# Patient Record
Sex: Male | Born: 1950 | Race: Black or African American | Hispanic: No | Smoking: Current every day smoker
Health system: Southern US, Community
[De-identification: ages and names within clinical notes are randomized; demographics above are authoritative.]

## PROBLEM LIST (undated history)

## (undated) DIAGNOSIS — F1411 Cocaine abuse, in remission: Secondary | ICD-10-CM

## (undated) DIAGNOSIS — I639 Cerebral infarction, unspecified: Secondary | ICD-10-CM

## (undated) DIAGNOSIS — I4891 Unspecified atrial fibrillation: Secondary | ICD-10-CM

## (undated) DIAGNOSIS — E119 Type 2 diabetes mellitus without complications: Secondary | ICD-10-CM

## (undated) DIAGNOSIS — I10 Essential (primary) hypertension: Secondary | ICD-10-CM

## (undated) DIAGNOSIS — M1990 Unspecified osteoarthritis, unspecified site: Secondary | ICD-10-CM

## (undated) DIAGNOSIS — C801 Malignant (primary) neoplasm, unspecified: Secondary | ICD-10-CM

## (undated) DIAGNOSIS — F1410 Cocaine abuse, uncomplicated: Secondary | ICD-10-CM

## (undated) DIAGNOSIS — I2699 Other pulmonary embolism without acute cor pulmonale: Secondary | ICD-10-CM

## (undated) DIAGNOSIS — Z87891 Personal history of nicotine dependence: Secondary | ICD-10-CM

## (undated) DIAGNOSIS — I8393 Asymptomatic varicose veins of bilateral lower extremities: Secondary | ICD-10-CM

## (undated) DIAGNOSIS — Z86718 Personal history of other venous thrombosis and embolism: Secondary | ICD-10-CM

## (undated) DIAGNOSIS — Z972 Presence of dental prosthetic device (complete) (partial): Secondary | ICD-10-CM

## (undated) DIAGNOSIS — I509 Heart failure, unspecified: Secondary | ICD-10-CM

## (undated) HISTORY — DX: Personal history of nicotine dependence: Z87.891

## (undated) HISTORY — DX: Heart failure, unspecified: I50.9

## (undated) HISTORY — DX: Essential (primary) hypertension: I10

## (undated) HISTORY — DX: Personal history of other venous thrombosis and embolism: Z86.718

## (undated) HISTORY — PX: ABDOMINAL SURGERY: SHX537

## (undated) HISTORY — DX: Cocaine abuse, uncomplicated: F14.10

## (undated) HISTORY — PX: COLONOSCOPY: SHX174

## (undated) HISTORY — PX: VASCULAR SURGERY: SHX849

## (undated) HISTORY — PX: THORACOTOMY: SUR1349

## (undated) MED FILL — Dexamethasone Sodium Phosphate Inj 100 MG/10ML: INTRAMUSCULAR | Qty: 1 | Status: AC

---

## 2004-04-03 ENCOUNTER — Emergency Department: Payer: Self-pay | Admitting: Emergency Medicine

## 2004-05-17 DIAGNOSIS — Z86718 Personal history of other venous thrombosis and embolism: Secondary | ICD-10-CM

## 2004-05-17 HISTORY — DX: Personal history of other venous thrombosis and embolism: Z86.718

## 2004-06-07 ENCOUNTER — Inpatient Hospital Stay: Payer: Self-pay | Admitting: Anesthesiology

## 2005-03-04 ENCOUNTER — Inpatient Hospital Stay: Payer: Self-pay

## 2005-03-10 ENCOUNTER — Ambulatory Visit: Payer: Self-pay

## 2006-06-29 ENCOUNTER — Emergency Department: Payer: Self-pay | Admitting: Emergency Medicine

## 2006-06-29 ENCOUNTER — Other Ambulatory Visit: Payer: Self-pay

## 2007-09-10 ENCOUNTER — Other Ambulatory Visit: Payer: Self-pay

## 2007-09-10 ENCOUNTER — Emergency Department: Payer: Self-pay | Admitting: Emergency Medicine

## 2007-12-13 ENCOUNTER — Emergency Department: Payer: Self-pay | Admitting: Emergency Medicine

## 2007-12-13 ENCOUNTER — Other Ambulatory Visit: Payer: Self-pay

## 2008-02-18 ENCOUNTER — Emergency Department: Payer: Self-pay | Admitting: Emergency Medicine

## 2008-04-11 ENCOUNTER — Emergency Department: Payer: Self-pay

## 2008-05-17 DIAGNOSIS — I2699 Other pulmonary embolism without acute cor pulmonale: Secondary | ICD-10-CM

## 2008-05-17 HISTORY — DX: Other pulmonary embolism without acute cor pulmonale: I26.99

## 2009-06-15 ENCOUNTER — Emergency Department: Payer: Self-pay | Admitting: Unknown Physician Specialty

## 2011-01-19 ENCOUNTER — Emergency Department: Payer: Self-pay | Admitting: Emergency Medicine

## 2011-04-11 ENCOUNTER — Emergency Department: Payer: Self-pay | Admitting: Emergency Medicine

## 2011-04-21 ENCOUNTER — Inpatient Hospital Stay: Payer: Self-pay | Admitting: Specialist

## 2011-04-26 ENCOUNTER — Encounter: Payer: Self-pay | Admitting: *Deleted

## 2011-04-26 ENCOUNTER — Other Ambulatory Visit: Payer: Self-pay | Admitting: Physician Assistant

## 2011-04-26 ENCOUNTER — Other Ambulatory Visit: Payer: Self-pay | Admitting: Physical Medicine and Rehabilitation

## 2011-04-26 DIAGNOSIS — I639 Cerebral infarction, unspecified: Secondary | ICD-10-CM

## 2011-04-26 DIAGNOSIS — I82409 Acute embolism and thrombosis of unspecified deep veins of unspecified lower extremity: Secondary | ICD-10-CM

## 2011-04-26 DIAGNOSIS — I10 Essential (primary) hypertension: Secondary | ICD-10-CM | POA: Insufficient documentation

## 2011-04-26 DIAGNOSIS — E119 Type 2 diabetes mellitus without complications: Secondary | ICD-10-CM

## 2011-04-26 DIAGNOSIS — E1142 Type 2 diabetes mellitus with diabetic polyneuropathy: Secondary | ICD-10-CM

## 2011-04-26 DIAGNOSIS — D649 Anemia, unspecified: Secondary | ICD-10-CM

## 2011-04-26 NOTE — PMR Pre-admission (Signed)
[facility] PMR Admission Coordinator Pre-Admission Assessment  Patient Name: [name]  Date of Birth: [birth year]  Insurance Information: Self pay Medicaid Application Date:04/26/11 Case manager at [facility] called to initiate application process-? Where in process it is         Current Medical History:   Patient Admitting Diagnosis: L-CVA  History of Present Illness: 60yo male admitted to [facility] on 04/21/11 after being found down at home. Brought to ED by EMS with r- facial droop and r-hemiparesis. He was also unable to speak. Pt does have a history of DVT 2006, diabetes (even though he shakes his head no when this is mentioned), malignant HTN, history of alcohol, tobacco abuse. It was documented that patient was supposed to be taking Coumadin, but don't think patient was compliant with this. Stroke work up done.INR on admission was 1.1, Hgb A1C=6.7.Echo done which shows an EF=>55%, normal Left ventricular function and no thrombus formation. Carotid US shows no stenosis. 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 Patient remains with expressive aphasia and apraxia with R-hemiparesis. Working with therapies and tolerating.   NIH Stroke scale: don't know results from outside hospital   Height and Weight Height: 6\' 3"  (190.5 cm) Weight: 72.576 kg (160 lb) Type of Weight: Stated BSA (Calculated - sq m): 1.96 sq meters BMI (Calculated): 20  Weight in (lb) to have BMI = 25: 199.6   Prior Rehab/Hospitalizations:no prior rehab stays  Medications:  PTA Medications:  Coumadin was listed as PTA meds at home No current outpatient prescriptions on file as of 04/26/2011.   No current facility-administered medications on file as of 04/26/2011.   Current Medications: Current outpatient prescriptions:warfarin (COUMADIN) 5 MG tablet, Take 5 mg by mouth daily.  , Disp: , Rfl:   Past Medical History: HTN, gout, history of DVT 2006, Diabetes (even though patient denies this),  history of alcohol, tobacco use, positive cocaine UDS on admission to hospital  Family History: mother deceased- aneurysm, father-decease diabetes  Precautions/Special Needs:  Precautions/Special Needs Precautions/Special Needs: Other Other Precautions: falls; patient having current gout flare in R knee Conditions/Impairments that will impact rehabilitation: Balance Balance Conditions/Impairments: R- hemiparesis Sensory Changes Conditions/Impairments: R-hemiparesis Other Conditions/Impairments: expressive aphasia  Additional Precautions:   Cognition:   appears to understand   Home Living:  Home Living Lives With: Alone Receives Help From: Friend(s);Family Type of Home: Apartment Home Layout: One level Home Access: Stairs to enter Entrance Stairs-Rails: None Entrance Stairs-Number of Steps: 2 Bathroom Shower/Tub: Associate Professor: Yes Home Adaptive Equipment: None  Functional Transfers:    Coordination:   R- hemiparesis Home Assistive Devices/Equipment:  Home Assistive Devices/Equipment Home Assistive Devices/Equipment: None;Other (Comment) (none that patient was able to report)  Discharge Planning:  Discharge Planning Living Arrangements: Family members Support Systems: Family members;Friends/neighbors Assistance Needed: will need assistance upon discharge Do you have any problems obtaining your medications?:  (patient unable to answer this due to expressive aphasia) Type of Residence: Private residence Patient expects to be discharged to:: sister's home Expected Discharge Date:  (to be determined) Case Management Consult Needed: Yes (Comment)  Prior Functional Levels:  Prior Functional Level Bed Mobility: Independent Transfers: Independent Mobility - Walk/Wheelchair: Independent Upper Body Dressing: Independent Lower Body Dressing: Independent Grooming: Independent Eating/Drinking: Independent Toilet Transfer:  Independent Bladder Continence: Independent Bowel Management: Independent Stair Climbing: Independent Communication: Independent Memory: Independent Cooking/Meal Prep: Independent Housework: Independent Money Management: Independent Driving: Independent  Current Functional Levels:  Current Functional Level Bed Mobility: CGA  Transfers: Min A Mobility - Walk/Wheelchair: Min A RW 77ft Upper Body Dressing: Min A Lower Body Dressing: Min-Mod A Grooming: R-hemiparesis (pt is right hand dominant) Eating/Drinking: set up Toilet Transfer: Min A Bladder Continence: urinal Stair Climbing: not attempted Communication: expressive aphasia Memory: WNL  Previous Home Environment:  Previous Home Environment Living Arrangements: Family members Support Systems: Family members;Friends/neighbors Assistance Needed: will need assistance upon discharge Do you have any problems obtaining your medications?:  (patient unable to answer this due to expressive aphasia) Type of Residence: Private residence Patient expects to be discharged to:: sister's home Expected Discharge Date:  (to be determined) Home Environment Number of Levels: 1  Discharge Living Setting:  Discharge Living Setting Plans for Discharge Living Setting: Other (Comment);House;Lives with (comment) (pt to go to sister's house in [place] after d/c) Discharge Living Setting Number of Levels: 2 Discharge Living Setting Number of Steps: 9 Discharge Living Setting is Bedroom on Main Floor?: Yes Discharge Living Setting is Bathroom on Main Floor?: Yes  Social/Family/Support Systems:  Social/Family/Support Systems Patient Roles: Partner;Parent;Other (Comment) (brother) Anticipated Caregiver: Renaldo Fiddler [phone] (girlfriend Darryl Nestle [phone]) Ability/Limitations of Caregiver: sister is able to assist Caregiver Availability: 24/7 Discharge Plan Discussed with Primary Caregiver: Yes Is Caregiver In Agreement with Plan?:  Yes Does Caregiver/Family have Issues with Lodging/Transportation while Pt is in Rehab?: No  Goals/Additional Needs:  Goals/Additional Needs Patient/Family Goal for Rehab: to be as independent as possible Cultural Considerations: Christian Dietary Needs: dysphagia I, honey thick Equipment Needs: to be determine Pt/Family Agrees to Admission and willing to participate: Yes Program Orientation Provided & Reviewed with Pt/Caregiver Including Roles  & Responsibilities: Yes  Preadmission Screen Completed By:  Oletta Darter, 04/26/2011 12:08 PM  Patient's condition:  Patient has been deemed appropriate for CIR  Preadmission Screen Completed by Toni Amend, Time/Date, 12:27pm on 04/26/11.    Discussed status with Dr. Riley Kill on 04/26/11 at 0800 (time/date) and received telephone approval for admission today.  Admission Coordinator:  Oletta Darter, time12:28pm/Date12/10/12  .

## 2011-04-26 NOTE — H&P (Signed)
Physical Medicine and Rehabilitation Admission H&P  Chief complaint: Right sided weakness and right facial droop : HPI: [name] is an 60 y.o. male with history of DM, DVT in the past, polysubstance abuse, admitted 12/05 to [facility] with right sided weakness and right facial droop. Patient on chronic coumadin?      INR 1.1 at admission. 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   Carotid dopplers without ICA stenosis.  2D echo with EF 55% no wall abnormality.  UDS positive for cocaine.  Patient started on aspirin for CVA prophylaxis.  Noted to have dysphagia with oro-motor weakness with drooling, and expressive deficits.  D1 diet with honey liquids recommended by ST.  Was noted to have new onset atrial fibrillation past admission.    Review of Systems  HENT: Negative for hearing loss.   Eyes: Negative for blurred vision and double vision.  Respiratory: Positive for cough and shortness of breath.   Cardiovascular: Positive for leg swelling. Negative for chest pain and palpitations.  Gastrointestinal: Negative for nausea, vomiting and abdominal pain.  Genitourinary: Negative for urgency and frequency.  Musculoskeletal: Positive for joint pain.       Right knee pain.  Skin: Positive for rash.  Neurological: Positive for sensory change, speech change, focal weakness and weakness. Negative for headaches.   Past Medical History  Diagnosis Date  . History of deep vein thrombosis 2006  . Hypertension   . Diabetes mellitus diet controlled.   . Gout current and history of  . History of tobacco abuse     has smoked for 50 years  . Cocaine abuse     + UDS on admission   No past surgical history on file.  Family History  Problem Relation Age of Onset  . Aneurysm Mother   . Diabetes Father    Social History:  Divorced.  Lives alone.  Has a  girlfriend.   He reports that he has been smoking Cigarettes about one  PPD.  He has a 50 pack-year smoking history. He does  not have any smokeless tobacco history on file. He reports that he uses illicit drugs. Drinks 4 drinks daily ( mixed drinks and beer).  Plans to discharge to sister's home in [place].     Allergies: Allergies no known allergies  Prior to Admission medications   Medication Sig Start Date End Date Taking? Authorizing Provider           Home:  One level home with 2 steps at entry.  Sister's home is 2 level with 7 steps at entry.   Functional History:  Independent and driving PTA.    Functional Status:  Mobility:  CGA for sit to stand while pushing heavily on LUE/LLE.  CGA for ambulating 20 to 40 feet with RW and assist to keep RUE on walker.  Limited by gout flare  and right knee instability.      ADL:  Working on fine motor movements and stereognosis RUE. Sensory deficits reported..  Cognition:  Oral apraxia.    LABS: Hgb A1C @ 6.7  Hbg 11.4   WBC 5.1     Plts 201 Chol: 95     Na: 139     K+3.4         BUN: 11       Cr: 0.70  HDL: 43 LDL: 38 Trig: 70  There were no vitals taken for this visit. Physical Exam  Constitutional: He appears well-developed and well-nourished.  HENT:  Head: Normocephalic and atraumatic.  Mouth/Throat: Oropharyngeal exudate (Thrush on tongue) present.    Eyes: Pupils are equal, round, and reactive to light. Scleral icterus is present.  Neck: Normal range of motion. Neck supple.  Cardiovascular: Normal rate.  An irregularly irregular rhythm present.  Pulmonary/Chest: Effort normal and breath sounds normal.  Abdominal: Soft.  Musculoskeletal: He exhibits edema.       Right knee: He exhibits decreased range of motion and effusion.       Legs:      Edema BLE right > left.  Neurological: He is alert. A cranial nerve deficit (Right facial weakness.  Dysphagia) and sensory deficit (Decreased sensation RUE) is present. Coordination abnormal.       RUE weakness with decreased ROM at shoulder and motor apraxia.  Decreased fine motor movements. He has a  right mild pronator drift.  Expressive aphasia.  Follows basic commands without difficulty.  Nods appropriately to Y/N questions.  Occasionally noted to have delayed processing.  Speech is quite dysarthric and often. He is a right central 7 and tongue deviation. masks appropriate responses to questions.  Skin: Skin is warm. Abrasion (right knee) noted.       Multiple scrapes and scabs over the right knee in the peripatellar region. There are a few scrapes on the left knee as well.    No results found for this or any previous visit (from the past 48 hour(s)). No results found.  Post Admission Physician Evaluation: 1. Functional deficits secondary  to left temporal parietal infarcts. 2. Patient admitted to receive collaborative, interdisciplinary care between the physiatrist, rehab nursing staff, and therapy team. 3. Patient's level of medical complexity and substantial therapy needs in context of that medical necessity cannot be provided at a lesser intensity of care. 4. Patient has experienced substantial functional loss from his/her baseline. Please see above functional assessments for current functional levels are premorbid functional levels. Judging by the patient's diagnosis, physical exam, and functional history, the patient has potential for functional progress which will result in measurable gains while on inpatient rehab.  These gains will be of substantial and practical use upon discharge in facilitating mobility and self-care at the household level. 5. Physiatrist will provide 24 hour management of medical needs as well as oversight of the therapy plan/treatment and provide guidance as appropriate regarding the interaction of the two. 6. 24 hour rehab nursing will assist in the management of  bladder management, bowel management, safety, disease management and patient education  and help integrate therapy concepts, techniques,education, etc. 7. PT will assess and treat for: balance,  locomotion, strength and transferring. Goals are: independent with assistive device. 8. OT will assess and treat for: bathing, dressing, locomotion, strength, toileting and transferring .  Goals are: independent with assistive device.  9. SLP will assess and treat for: cognition and speech.  Goals are: independent with increased time to min verbal cues 10. Case Management and Social Worker will assess and treat for psychological issues and discharge planning. 11. Team conference will be held weekly to assess progress toward goals and to determine barriers to discharge. 12.  Patient will receive at least 3 hours of therapy per day at least 5 days per week. 13. ELOS and Prognosis: One week excellent   Medical Problem List and Plan: 1. DVT Prophylaxis/Anticoagulation: Pharmaceutical: Lovenox  2. Pain Management: complete steroid taper.  Add colchine and discontinue indocin as contraindicated with acute CVA. Right knee pain is in [place] factor for gait.  3. Mood: reported to be labile.  Currently without distress. Monitor.   4.  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  5. Diabetes mellitus:  Monitor with AC/HS cbg checks.  CM diet for now. Add oral agent if BS not controlled.  6. HTN (hypertension):  Monitor with bid checks.  On nitro patch, metoprolol, and coreg for control?  7. Chronic Anemia? :  Check anemia panel  10.  H/O  DVT (deep venous thrombosis):  Add lovenox.TEDs for edema control.  11. Gout:  On steroid taper.  Add colchicine  12. Thrush:  Add diflucan.   04/26/2011, 3:54 PM

## 2011-04-27 ENCOUNTER — Inpatient Hospital Stay (HOSPITAL_COMMUNITY)
Admission: RE | Admit: 2011-04-27 | Discharge: 2011-05-05 | DRG: 945 | Disposition: A | Discharge status: Home / Self Care | Payer: Medicaid Other | Source: Ambulatory Visit | Attending: Physical Medicine & Rehabilitation | Admitting: Physical Medicine & Rehabilitation

## 2011-04-27 ENCOUNTER — Other Ambulatory Visit: Payer: Self-pay | Admitting: Physical Medicine and Rehabilitation

## 2011-04-27 DIAGNOSIS — I10 Essential (primary) hypertension: Secondary | ICD-10-CM

## 2011-04-27 DIAGNOSIS — R29810 Facial weakness: Secondary | ICD-10-CM

## 2011-04-27 DIAGNOSIS — Z5189 Encounter for other specified aftercare: Secondary | ICD-10-CM

## 2011-04-27 DIAGNOSIS — I4891 Unspecified atrial fibrillation: Secondary | ICD-10-CM

## 2011-04-27 DIAGNOSIS — I6350 Cerebral infarction due to unspecified occlusion or stenosis of unspecified cerebral artery: Secondary | ICD-10-CM

## 2011-04-27 DIAGNOSIS — Z86718 Personal history of other venous thrombosis and embolism: Secondary | ICD-10-CM

## 2011-04-27 DIAGNOSIS — M25569 Pain in unspecified knee: Secondary | ICD-10-CM

## 2011-04-27 DIAGNOSIS — F1410 Cocaine abuse, uncomplicated: Secondary | ICD-10-CM

## 2011-04-27 DIAGNOSIS — G8190 Hemiplegia, unspecified affecting unspecified side: Secondary | ICD-10-CM

## 2011-04-27 DIAGNOSIS — I639 Cerebral infarction, unspecified: Secondary | ICD-10-CM

## 2011-04-27 DIAGNOSIS — R1310 Dysphagia, unspecified: Secondary | ICD-10-CM

## 2011-04-27 DIAGNOSIS — I6330 Cerebral infarction due to thrombosis of unspecified cerebral artery: Secondary | ICD-10-CM

## 2011-04-27 DIAGNOSIS — R488 Other symbolic dysfunctions: Secondary | ICD-10-CM

## 2011-04-27 DIAGNOSIS — R4701 Aphasia: Secondary | ICD-10-CM

## 2011-04-27 DIAGNOSIS — Z87891 Personal history of nicotine dependence: Secondary | ICD-10-CM

## 2011-04-27 DIAGNOSIS — I82409 Acute embolism and thrombosis of unspecified deep veins of unspecified lower extremity: Secondary | ICD-10-CM

## 2011-04-27 DIAGNOSIS — E1142 Type 2 diabetes mellitus with diabetic polyneuropathy: Secondary | ICD-10-CM

## 2011-04-27 DIAGNOSIS — B370 Candidal stomatitis: Secondary | ICD-10-CM

## 2011-04-27 DIAGNOSIS — E119 Type 2 diabetes mellitus without complications: Secondary | ICD-10-CM

## 2011-04-27 DIAGNOSIS — M109 Gout, unspecified: Secondary | ICD-10-CM

## 2011-04-27 DIAGNOSIS — D649 Anemia, unspecified: Secondary | ICD-10-CM

## 2011-04-27 LAB — GLUCOSE, CAPILLARY
Glucose-Capillary: 103 mg/dL — ABNORMAL HIGH (ref 70–99)
Glucose-Capillary: 106 mg/dL — ABNORMAL HIGH (ref 70–99)

## 2011-04-27 MED ORDER — FLUCONAZOLE 100 MG PO TABS
100.0000 mg | ORAL_TABLET | Freq: Every day | ORAL | Status: AC
  Administered 2011-04-27 – 2011-05-01 (×5): 100 mg via ORAL
  Filled 2011-04-27 (×5): qty 1

## 2011-04-27 MED ORDER — TRAZODONE HCL 50 MG PO TABS
25.0000 mg | ORAL_TABLET | Freq: Every evening | ORAL | Status: DC | PRN
Start: 2011-04-27 — End: 2011-05-05

## 2011-04-27 MED ORDER — NICOTINE 21 MG/24HR TD PT24
21.0000 mg | MEDICATED_PATCH | Freq: Every day | TRANSDERMAL | Status: DC
  Administered 2011-04-27 – 2011-05-05 (×9): 21 mg via TRANSDERMAL
  Filled 2011-04-27 (×11): qty 1

## 2011-04-27 MED ORDER — PREDNISONE 20 MG PO TABS
20.0000 mg | ORAL_TABLET | Freq: Every day | ORAL | Status: AC
  Administered 2011-04-28: 20 mg via ORAL
  Filled 2011-04-27: qty 1

## 2011-04-27 MED ORDER — GUAIFENESIN-DM 100-10 MG/5ML PO SYRP
5.0000 mL | ORAL_SOLUTION | Freq: Four times a day (QID) | ORAL | Status: DC | PRN
Start: 2011-04-27 — End: 2011-05-05
  Administered 2011-05-02 – 2011-05-03 (×2): 10 mL via ORAL
  Filled 2011-04-27 (×2): qty 10

## 2011-04-27 MED ORDER — PREDNISONE 5 MG PO TABS
5.0000 mg | ORAL_TABLET | Freq: Every day | ORAL | Status: AC
  Administered 2011-05-01 – 2011-05-02 (×2): 5 mg via ORAL
  Filled 2011-04-27 (×2): qty 1

## 2011-04-27 MED ORDER — SIMVASTATIN 10 MG PO TABS
10.0000 mg | ORAL_TABLET | Freq: Every day | ORAL | Status: DC
  Administered 2011-04-27 – 2011-05-04 (×8): 10 mg via ORAL
  Filled 2011-04-27 (×10): qty 1

## 2011-04-27 MED ORDER — PROMETHAZINE HCL 25 MG/ML IJ SOLN: 12.5000 mg | Freq: Four times a day (QID) | INTRAMUSCULAR | Status: DC | PRN

## 2011-04-27 MED ORDER — ACETAMINOPHEN 325 MG PO TABS: 325.0000 mg | ORAL_TABLET | ORAL | Status: DC | PRN

## 2011-04-27 MED ORDER — ALUM & MAG HYDROXIDE-SIMETH 400-400-40 MG/5ML PO SUSP: 30.0000 mL | ORAL | Status: DC | PRN

## 2011-04-27 MED ORDER — INSULIN ASPART 100 UNIT/ML ~~LOC~~ SOLN
0.0000 [IU] | Freq: Every day | SUBCUTANEOUS | Status: DC
  Filled 2011-04-27: qty 3

## 2011-04-27 MED ORDER — NYSTATIN 100000 UNIT/ML MT SUSP
10.0000 mL | Freq: Four times a day (QID) | OROMUCOSAL | Status: AC
  Administered 2011-04-27 – 2011-05-02 (×20): 1000000 [IU] via ORAL
  Filled 2011-04-27 (×20): qty 10

## 2011-04-27 MED ORDER — ALUM & MAG HYDROXIDE-SIMETH 200-200-20 MG/5ML PO SUSP: 30.0000 mL | ORAL | Status: DC | PRN

## 2011-04-27 MED ORDER — DIPHENHYDRAMINE HCL 12.5 MG/5ML PO ELIX: 12.5000 mg | ORAL_SOLUTION | Freq: Four times a day (QID) | ORAL | Status: DC | PRN

## 2011-04-27 MED ORDER — BISACODYL 10 MG RE SUPP: 10.0000 mg | Freq: Every day | RECTAL | Status: DC | PRN

## 2011-04-27 MED ORDER — ENOXAPARIN SODIUM 40 MG/0.4ML ~~LOC~~ SOLN
40.0000 mg | SUBCUTANEOUS | Status: DC
  Administered 2011-04-28 – 2011-05-04 (×7): 40 mg via SUBCUTANEOUS
  Filled 2011-04-27 (×9): qty 0.4

## 2011-04-27 MED ORDER — FOOD THICKENER (THICKENUP CLEAR)
2.0000 | ORAL | Status: DC | PRN
  Filled 2011-04-27: qty 2.8

## 2011-04-27 MED ORDER — NITROGLYCERIN 0.3 MG/HR TD PT24
0.3000 mg | MEDICATED_PATCH | Freq: Every day | TRANSDERMAL | Status: DC
  Filled 2011-04-27: qty 1

## 2011-04-27 MED ORDER — INSULIN ASPART 100 UNIT/ML ~~LOC~~ SOLN
0.0000 [IU] | Freq: Three times a day (TID) | SUBCUTANEOUS | Status: DC
  Administered 2011-04-28 – 2011-05-02 (×3): 1 [IU] via SUBCUTANEOUS

## 2011-04-27 MED ORDER — COLCHICINE 0.6 MG PO TABS
0.6000 mg | ORAL_TABLET | Freq: Every day | ORAL | Status: DC
  Administered 2011-04-28: 0.6 mg via ORAL
  Filled 2011-04-27 (×2): qty 1

## 2011-04-27 MED ORDER — PROMETHAZINE HCL 12.5 MG RE SUPP: 12.5000 mg | Freq: Four times a day (QID) | RECTAL | Status: DC | PRN

## 2011-04-27 MED ORDER — TRAMADOL HCL 50 MG PO TABS
50.0000 mg | ORAL_TABLET | Freq: Four times a day (QID) | ORAL | Status: DC | PRN
  Administered 2011-04-27 – 2011-05-03 (×5): 50 mg via ORAL
  Filled 2011-04-27 (×6): qty 1

## 2011-04-27 MED ORDER — FLEET ENEMA 7-19 GM/118ML RE ENEM
1.0000 | ENEMA | Freq: Once | RECTAL | Status: AC | PRN
  Filled 2011-04-27: qty 1

## 2011-04-27 MED ORDER — PREDNISONE 10 MG PO TABS
10.0000 mg | ORAL_TABLET | Freq: Every day | ORAL | Status: AC
  Administered 2011-04-29 – 2011-04-30 (×2): 10 mg via ORAL
  Filled 2011-04-27 (×2): qty 1

## 2011-04-27 MED ORDER — NITROGLYCERIN 0.3 MG/HR TD PT24
0.3000 mg | MEDICATED_PATCH | Freq: Every day | TRANSDERMAL | Status: DC
  Administered 2011-04-27 – 2011-05-04 (×8): 0.3 mg via TRANSDERMAL
  Filled 2011-04-27 (×9): qty 1

## 2011-04-27 MED ORDER — PROMETHAZINE HCL 12.5 MG PO TABS: 12.5000 mg | ORAL_TABLET | Freq: Four times a day (QID) | ORAL | Status: DC | PRN

## 2011-04-27 MED ORDER — CARVEDILOL 3.125 MG PO TABS
3.1250 mg | ORAL_TABLET | Freq: Two times a day (BID) | ORAL | Status: DC
  Filled 2011-04-27 (×2): qty 1

## 2011-04-27 MED ORDER — METOPROLOL TARTRATE 25 MG PO TABS
25.0000 mg | ORAL_TABLET | Freq: Two times a day (BID) | ORAL | Status: DC
  Administered 2011-04-27 – 2011-05-05 (×16): 25 mg via ORAL
  Filled 2011-04-27 (×18): qty 1

## 2011-04-27 MED ORDER — POLYETHYLENE GLYCOL 3350 17 G PO PACK
17.0000 g | PACK | Freq: Every day | ORAL | Status: DC | PRN
  Administered 2011-04-28: 17 g via ORAL
  Filled 2011-04-27: qty 1

## 2011-04-27 MED ORDER — HYDROCERIN EX CREA
TOPICAL_CREAM | Freq: Two times a day (BID) | CUTANEOUS | Status: DC
  Administered 2011-04-27 – 2011-05-04 (×13): via TOPICAL
  Filled 2011-04-27: qty 113

## 2011-04-27 NOTE — Progress Notes (Signed)
Patient admitted from [facility] via private transport; Alert, expressive aphasia; appropriate responses with nods, gestures; follows two step commands with extra time; occasional one word responses; Oriented to room and rehab process; safety plan reviewed with pt and sisters; VSS; see FIM for functional status, FS for full assessment.[name]

## 2011-04-28 DIAGNOSIS — Z5189 Encounter for other specified aftercare: Secondary | ICD-10-CM

## 2011-04-28 DIAGNOSIS — I6330 Cerebral infarction due to thrombosis of unspecified cerebral artery: Secondary | ICD-10-CM

## 2011-04-28 LAB — DIFFERENTIAL
Basophils Relative: 0 % (ref 0–1)
Eosinophils Absolute: 0.1 10*3/uL (ref 0.0–0.7)
Eosinophils Relative: 1 % (ref 0–5)
Lymphs Abs: 1.5 10*3/uL (ref 0.7–4.0)

## 2011-04-28 LAB — GLUCOSE, CAPILLARY
Glucose-Capillary: 125 mg/dL — ABNORMAL HIGH (ref 70–99)
Glucose-Capillary: 115 mg/dL — ABNORMAL HIGH (ref 70–99)

## 2011-04-28 LAB — COMPREHENSIVE METABOLIC PANEL
ALT: 11 U/L (ref 0–53)
AST: 17 U/L (ref 0–37)
Albumin: 2.7 g/dL — ABNORMAL LOW (ref 3.5–5.2)
Alkaline Phosphatase: 46 U/L (ref 39–117)
CO2: 27 mEq/L (ref 19–32)
Chloride: 102 mEq/L (ref 96–112)
Creatinine, Ser: 0.83 mg/dL (ref 0.50–1.35)
GFR calc non Af Amer: >90 mL/min (ref >90)
Potassium: 3.7 mEq/L (ref 3.5–5.1)
Sodium: 140 mEq/L (ref 135–145)
Total Bilirubin: 0.3 mg/dL (ref 0.3–1.2)

## 2011-04-28 LAB — CBC
MCH: 26.2 pg (ref 26.0–34.0)
MCHC: 32.7 g/dL (ref 30.0–36.0)
MCV: 80.3 fL (ref 78.0–100.0)
Platelets: 192 10*3/uL (ref 150–400)
RBC: 3.7 MIL/uL — ABNORMAL LOW (ref 4.22–5.81)

## 2011-04-28 LAB — URINALYSIS, ROUTINE W REFLEX MICROSCOPIC
Ketones, ur: NEGATIVE mg/dL
Leukocytes, UA: NEGATIVE
Nitrite: NEGATIVE
Protein, ur: 100 mg/dL — AB
Urobilinogen, UA: 1 mg/dL (ref 0.0–1.0)

## 2011-04-28 MED ORDER — ASPIRIN EC 325 MG PO TBEC
325.0000 mg | DELAYED_RELEASE_TABLET | Freq: Every day | ORAL | Status: DC
  Administered 2011-04-28 – 2011-05-05 (×8): 325 mg via ORAL
  Filled 2011-04-28 (×9): qty 1

## 2011-04-28 MED ORDER — COLCHICINE 0.6 MG PO TABS
0.6000 mg | ORAL_TABLET | Freq: Two times a day (BID) | ORAL | Status: DC
  Administered 2011-04-28 – 2011-05-04 (×13): 0.6 mg via ORAL
  Filled 2011-04-28 (×16): qty 1

## 2011-04-28 MED ORDER — ENSURE PUDDING PO PUDG
1.0000 | Freq: Two times a day (BID) | ORAL | Status: DC
  Administered 2011-04-28 – 2011-05-05 (×11): 1 via ORAL

## 2011-04-28 NOTE — Plan of Care (Signed)
Overall Plan of Care [facility]) Patient Details Name: [name] MRN: [id number] DOB: 04-22-1951  Diagnosis:  cva  Primary Diagnosis:    CVA (cerebral infarction) Co-morbidities: diabetes, htn  Functional Problem List  Patient demonstrates impairments in the following areas: Balance, Bladder, Bowel, Cognition, Endurance, Linguistic, Medication Management, Motor, Nutrition, Pain, Perception, Safety and Skin Integrity  Basic ADL's: grooming, bathing, dressing and toileting Advanced ADL's: simple meal preparation  Transfers:  bed mobility, bed to chair, toilet, tub/shower, car, furniture and floor Locomotion:  ambulation and stairs  Additional Impairments:  Communication  expression  Anticipated Outcomes Item Anticipated Outcome  Eating/Swallowing  Mod I with least restrictive diet  Basic self-care  Mod I  Tolieting  Mod I  Bowel/Bladder  supervision  Transfers  Mod I  Locomotion  Mod I  Communication  Min A  Cognition  Mod I  Pain  2/10   managed  Safety/Judgment  supervision  Other     Therapy Plan: PT Frequency: 1-2 X/day, 60-90 minutes OT Frequency: 1-2 X/day, 60-90 minutes SLP Frequency: 1-2X/day, 60-90 minutes  Team Interventions: Item RN PT OT SLP SW TR Other  Self Care/Advanced ADL Retraining   x      Neuromuscular Re-Education  x x   x   Therapeutic Activities  x x x  x   UE/LE Strength Training/ROM  x x   x   UE/LE Coordination Activities  x x   x   Visual/Perceptual Remediation/Compensation         DME/Adaptive Equipment Instruction  x x   x   Therapeutic Exercise  x x   x   Balance/Vestibular Training  x x   x   Patient/Family Education x x x x  x   Cognitive Remediation/Compensation  x x x  x   Functional Mobility Training  x    x   Ambulation/Gait Training  x    x   Stair Training  x       Wheelchair Propulsion/Positioning         Health and safety inspector Reintegration  x x   x   Dysphagia/Aspiration  Precaution Training    x     Speech/Language Facilitation   x x     Bladder Management x        Bowel Management x        Disease Management/Prevention x        Pain Management x x       Medication Management x        Skin Care/Wound Management         Splinting/Orthotics         Discharge Planning   x  x x   Psychosocial Support     x x                      Team Discharge Planning: Destination:  Home Projected Follow-up:  PT, OT and Outpatient Projected Equipment Needs:  Cane and Environmental consultant Patient/family involved in discharge planning:  Yes  MD ELOS: 7-10 days Medical Rehab Prognosis:  Excellent Assessment: Patient is admitted for CIR therapies.  PT will focus on mobility, NMR, adaptive equipment, with  Modified independent goals.  OT will work with the patient on upper extremity strength, self-care, NMR, adaptive equipment training with modified ind to supervision goals.  SLP will focus on pt's dysarthria, aphasia, apraxia, and dysphagia with goals modified independent to minimal  assistance.

## 2011-04-28 NOTE — Progress Notes (Signed)
INITIAL ADULT NUTRITION ASSESSMENT Date: 04/28/2011   Time: 11:40 AM  Reason for Assessment: Dysphagia  ASSESSMENT: Male 60 y.o.  Dx: CVA (cerebral infarction)  Hx:  Past Medical History  Diagnosis Date  . History of deep vein thrombosis 2006  . Hypertension   . Diabetes mellitus   . Gout current and history of  . History of tobacco abuse     has smoked for 50 years  . Cocaine abuse     + UDS on admission   Related Meds:     . aspirin EC  325 mg Oral Daily  . colchicine  0.6 mg Oral BID  . enoxaparin  40 mg Subcutaneous Q24H  . fluconazole  100 mg Oral Daily  . hydrocerin   Topical BID  . insulin aspart  0-5 Units Subcutaneous QHS  . insulin aspart  0-9 Units Subcutaneous TID WC  . metoprolol tartrate  25 mg Oral BID  . nicotine  21 mg Transdermal Daily  . nitroGLYCERIN  0.3 mg Transdermal q1800  . nystatin  10 mL Oral QID  . predniSONE  20 mg Oral Q breakfast   Followed by  . predniSONE  10 mg Oral Q breakfast   Followed by  . predniSONE  5 mg Oral Q breakfast  . simvastatin  10 mg Oral q1800  . DISCONTD: carvedilol  3.125 mg Oral BID WC  . DISCONTD: colchicine  0.6 mg Oral Daily  . DISCONTD: nitroGLYCERIN  0.3 mg Transdermal Daily   Ht: 6\' 2"  (188 cm)  Wt: 216 lb 8 oz (98.204 kg)  Ideal Wt: 86.4 kg % Ideal Wt: 114%  Usual Wt: n/a % Usual Wt: n/a  Body mass index is 27.80 kg/(m^2). Pt is overweight.  Food/Nutrition Related Hx: unable to obtain hx from pt  Labs:  CMP     Component Value Date/Time   NA 140 04/28/2011 0625   K 3.7 04/28/2011 0625   CL 102 04/28/2011 0625   CO2 27 04/28/2011 0625   GLUCOSE 100* 04/28/2011 0625   BUN 14 04/28/2011 0625   CREATININE 0.83 04/28/2011 0625   CALCIUM 8.9 04/28/2011 0625   PROT 6.8 04/28/2011 0625   ALBUMIN 2.7* 04/28/2011 0625   AST 17 04/28/2011 0625   ALT 11 04/28/2011 0625   ALKPHOS 46 04/28/2011 0625   BILITOT 0.3 04/28/2011 0625   GFRNONAA >90 04/28/2011 0625   GFRAA >90 04/28/2011 0625    CBG (last 3)   Basename 04/28/11 0710 04/27/11 2032 04/27/11 1613  GLUCAP 100* 106* 103*   Diet Order: Dysphagia 3, Nectar Thickened Liquids  Supplements/Tube Feeding: none  IVF:    Estimated Nutritional Needs:   Kcal:  2000 - 2200 Protein:  105 - 115 g Fluid:  2 - 2.2 L/d  Pt eating well per chart review. Denies wt loss PTA.  NUTRITION DIAGNOSIS: -Swallowing difficulty (NI-1.1).  Status: Ongoing  RELATED TO: recent CVA  AS EVIDENCE BY: need for thickened liquids.  MONITORING/EVALUATION(Goals): Goal: Pt to consume >75% of meals and supplements. Monitor: PO intake, weights, labs  EDUCATION NEEDS: -No education needs identified at this time  INTERVENTION: 1. Ensure Pudding PO BID for additional protein and kcal 2. RD to follow nutrition care plan  Dietitian #: [id number]  DOCUMENTATION CODES Per approved criteria  -Not Applicable    Adair Laundry 04/28/2011, 11:40 AM

## 2011-04-28 NOTE — Progress Notes (Signed)
Occupational Therapy Assessment and Plan  Patient Details  Name: [name] MRN: [id number] Date of Birth: May 01, 1951  OT Diagnosis: acute pain and hemiplegia affecting dominant side Rehab Potential: Rehab Potential: Good ELOS: 10days   Today's Date: 04/28/2011 Time: 745-845 ( )    Assessment & Plan Clinical Impression: Patient is a 60 y.o. year old male with  with history of DM, DVT in the past, polysubstance abuse, admitted 12/05 to [facility] with right sided weakness and right facial droop. Patient on chronic coumadin? INR 1.1 at admission. 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 Carotid dopplers without ICA stenosis. 2D echo with EF 55% no wall abnormality. UDS positive for cocaine. Patient started on aspirin for CVA prophylaxis. Noted to have dysphagia with oro-motor weakness with drooling, and expressive deficits.    Patient transferred to [facility] on 04/27/2011 .      Patient currently requires mod with basic self-care skills secondary to muscle weakness, impaired timing and sequencing, motor apraxia, decreased coordination and decreased motor planning, decreased motor planning, decreased safety awareness and delayed processing and decreased standing balance, hemiplegia and decreased balance strategies. Pt also presents with dysphagia, expressive aphasia and oral apraxia.  Prior to hospitalization, patient could complete ADLs with independence.  Patient will benefit from skilled intervention to increase independence with basic self-care skills prior to discharge home with care partner.  Anticipate patient will require intermittent supervision and follow up outpatient.  OT - End of Session Activity Tolerance: Tolerates 30+ min activity without fatigue OT Assessment Rehab Potential: Good OT Plan OT Frequency: 1-2 X/day, 60-90 minutes Estimated Length of Stay: 10days OT Treatment/Interventions: Ambulation/gait training;Community reintegration;Functional  mobility training;Self Care/advanced ADL retraining;Therapeutic Exercise;UE/LE Strength taining/ROM;UE/LE Coordination activities;Therapeutic Activities;Patient/family education;Balance/vestibular training;DME/adaptive equipment instruction;Neuromuscular re-education OT Recommendation Follow Up Recommendations: Outpatient OT  Precautions/Restrictions  Precautions Precautions: Fall Required Braces or Orthoses: No Restrictions Weight Bearing Restrictions: No General Chart Reviewed: Yes Family/Caregiver Present: No Vital Signs   Pain Pain Assessment Pain Assessment: 0-10 Pain Score:   2 Pain Type: Chronic pain Pain Location: Knee Pain Orientation: Right Pain Descriptors: Aching Pain Onset: On-going Pain Intervention(s): RN made aware Home Living/Prior Functioning Home Living Lives With: Alone Receives Help From: Family;Friend(s) Bathroom Shower/Tub: Tub/shower unit IADL History Current License: Yes Occupation: Retired ADL ADL Eating: Maximal cueing;Set up Where Assessed-Eating: Chair Upper Body Bathing: Setup;Minimal cueing Where Assessed-Upper Body Bathing: Sitting at sink Lower Body Bathing: Moderate assistance Where Assessed-Lower Body Bathing: Standing at sink;Sitting at sink Upper Body Dressing: Setup Where Assessed-Upper Body Dressing: Sitting at sink Lower Body Dressing: Maximal assistance;Minimal cueing Where Assessed-Lower Body Dressing: Standing at sink;Sitting at sink Toilet Transfer Method: Not assessed Tub/Shower Transfer: Not assessed Vision/Perception  Vision - History Baseline Vision: No visual deficits Patient Visual Report: No change from baseline Vision - Assessment Eye Alignment: Within Functional Limits Perception Perception: Within Functional Limits Praxis Praxis: Impaired Praxis Impairment Details: Motor planning;Perseveration  Cognition Arousal/Alertness: Awake/alert Orientation Level: Oriented X4 Attention: Selective Selective  Attention: Appears intact Memory: Appears intact Safety/Judgment: Appears intact Sensation Sensation Light Touch: Impaired by gross assessment Stereognosis: Impaired Detail Stereognosis Impaired Details: Impaired RUE Proprioception: Impaired by gross assessment;Impaired Detail Proprioception Impaired Details: Impaired RUE;Impaired RLE Coordination Gross Motor Movements are Fluid and Coordinated: No Fine Motor Movements are Fluid and Coordinated: No Coordination and Movement Description: decreased FMC with grooming tools/ utensils Finger Nose Finger Test: decreased speed and coordination Motor  Motor Motor: Hemiplegia;Motor apraxia Motor - Skilled Clinical Observations: rt mild pronator drift Mobility  Transfers Transfers:  Yes Sit to Stand: 3: Mod assist Stand to Sit: 4: Min assist  Trunk/Postural Assessment  Cervical Assessment Cervical Assessment: Within Functional Limits Thoracic Assessment Thoracic Assessment: Within Functional Limits Lumbar Assessment Lumbar Assessment: Within Functional Limits Postural Control Postural Control: Within Functional Limits  Balance Balance Balance Assessed: Yes Static Standing Balance Static Standing - Balance Support: No upper extremity supported;During functional activity Static Standing - Level of Assistance: 4: Min assist Extremity/Trunk Assessment RUE Assessment RUE Assessment: Exceptions to [facility] (rt mild pronator drift) RUE Strength RUE Overall Strength: Deficits (4-/5) LUE Assessment LUE Assessment: Within Functional Limits  Recommendations for other services: None  Discharge Criteria: Patient will be discharged from OT if patient refuses treatment 3 consecutive times without medical reason, if treatment goals not met, if there is a change in medical status, if patient makes no progress towards goals or if patient is discharged from hospital.  The above assessment, treatment plan, treatment alternatives and goals were  discussed and mutually agreed upon: by patient   Treatment session:  1:1 OT eval initiated, OT purpose, role, and goals discussed.  Self care retraining at sink level: focus on sit to stand, standing balance, use of right hand at non dominant level, decreased FMC with functional tasks, self feeding with min cuing for swallowing precautions, functional communication with contextual cues and gestures. Melonie Florida 04/28/2011, 11:55 AM

## 2011-04-28 NOTE — Progress Notes (Signed)
Patient information reviewed and entered into [facility] system by Ganesh Deeg, RN, CRRN, PPS Coordinator.  Information including medical coding and functional independence measure will be reviewed and updated through discharge.    

## 2011-04-28 NOTE — Progress Notes (Deleted)
Physical Therapy Assessment and Plan  Patient Details  Name: [name] MRN: [id number] Date of Birth: Mar 05, 1951  PT Diagnosis: Abnormality of gait, Impaired sensation and Muscle weakness Rehab Potential: Good ELOS:     Today's Date: 04/28/2011 Time: 4098-1191 Time Calculation (min): 60 min  Assessment & Plan Clinical Impression: Patient is a 60 y.o. year old male with history of DM, DVT in the past, polysubstance abuse, and gout admitted to CIR on 04/26/2011 for s/p CVA with right hemiparesis, expressive aphasia with oral apraxia and dysphagia.  PTA, patient lived alone in an apartment with 2 steps to enter.  Also, PTA, patient was independent with ADL's, IADL's, all mobility and was driving.  Patient was unemployed PTA.  Patient currently presents with   Patient will benefit from skilled inpatient PT to address the above impairments and provide family education for safe discharge to sister's house with outpatient PT.   PT - End of Session Activity Tolerance: Tolerates 30+ min activity with multiple rests PT Assessment Rehab Potential: Good Barriers to Discharge: None PT Plan PT Frequency: 1-2 X/day, 60-90 minutes PT Treatment/Interventions: Ambulation/gait training;Balance/vestibular training;Community reintegration;Functional mobility training;Neuromuscular re-education;Patient/family education;Stair training;Therapeutic Activities;Therapeutic Exercise;UE/LE Strength taining/ROM PT Recommendation Recommendations for Other Services: Speech consult Follow Up Recommendations: Outpatient PT  Precautions/Restrictions Precautions Precautions: Fall Required Braces or Orthoses: No Restrictions Weight Bearing Restrictions: No Pain Pain Assessment Pain Assessment: 0-10 Pain Score:   2 Pain Type: Chronic pain Pain Location: Knee Pain Orientation: Right Pain Descriptors: Aching; Pain Onset: On-going Pain Intervention(s): Repositioned;Ambulation/increased  activity Multiple Pain Sites: No Home Living/Prior Functioning Home Living Lives With: Alone;Other (Comment) (will discharge to sister's house) Receives Help From: Family (sister) Type of Home: House Home Layout: Two level (sister's house) Home Access: Stairs to enter Home Adaptive Equipment: None Prior Function Level of Independence: Independent with basic ADLs;Independent with gait;Independent with homemaking with ambulation Able to Take Stairs?: Yes Driving: Yes Vision/Perception     Cognition Overall Cognitive Status: Appears within functional limits for tasks assessed Arousal/Alertness: Awake/alert Orientation Level: Other (Comment) (expressive aphasia; yes/no responses) Sensation Sensation Light Touch: Impaired by gross assessment Stereognosis: Impaired Detail Stereognosis Impaired Details: Impaired RUE Proprioception: Impaired by gross assessment;Impaired Detail Proprioception Impaired Details: Impaired RUE;Impaired RLE Coordination Gross Motor Movements are Fluid and Coordinated: No Fine Motor Movements are Fluid and Coordinated: No Coordination and Movement Description: decreased FMC with grooming tools/ utensils Finger Nose Finger Test: decreased speed and coordination Motor  Motor Motor: Hemiplegia;Motor apraxia Motor - Skilled Clinical Observations: rt mild pronator drift  Mobility Bed Mobility Bed Mobility: No Transfers Transfers: Yes Sit to Stand: 5: Supervision;With upper extremity assist;From chair/3-in-1 Stand to Sit: 5: Supervision;With upper extremity assist;To chair/3-in-1 Stand Pivot Transfers: 3: Mod assist Stand Pivot Transfer Details: Tactile cues for weight shifting;Tactile cues for posture;Tactile cues for placement;Tactile cues for weight beaing;Visual cues for safe use of DME/AE;Verbal cues for precautions/safety Squat Pivot Transfers: 5: Supervision;With upper extremity assistance;With armrests (plinth<->chair) Locomotion   Ambulation Ambulation: Yes Ambulation/Gait Assistance: 5: Supervision Ambulation Distance (Feet): 150 Feet (>174ft) Assistive device: None Gait Gait: Yes Gait Pattern: Impaired Gait Pattern: Decreased stance time - right;Decreased step length - left;Step-through pattern;Antalgic Stairs / Additional Locomotion Stairs: Yes Stairs Assistance: 5: Supervision Stair Management Technique: Alternating pattern;Two rails Height of Stairs: 9  Corporate treasurer: Yes Wheelchair Assistance: 5: Financial planner Details: Verbal cues for technique;Verbal cues for Copy: Both upper extremities Wheelchair Parts Management: Needs assistance Distance: 25  Trunk/Postural Assessment  Cervical Assessment Cervical Assessment:  Within Functional Limits Thoracic Assessment Thoracic Assessment: Within Functional Limits Lumbar Assessment Lumbar Assessment: Within Functional Limits Postural Control Postural Control: Within Functional Limits  Balance Balance Balance Assessed: Yes Standardized Balance Assessment Standardized Balance Assessment: Berg Balance Test;Dynamic Gait Index Berg Balance Test Sit to Stand: Able to stand without using hands and stabilize independently Standing Unsupported: Able to stand safely 2 minutes Sitting with Back Unsupported but Feet Supported on Floor or Stool: Able to sit safely and securely 2 minutes Stand to Sit: Controls descent by using hands Transfers: Able to transfer safely, definite need of hands Standing Unsupported with Eyes Closed: Able to stand 10 seconds with supervision Standing Ubsupported with Feet Together: Able to place feet together independently and stand 1 minute safely From Standing, Reach Forward with Outstretched Arm: Can reach forward >12 cm safely (5") From Standing Position, Pick up Object from Floor: Able to pick up shoe safely and easily From Standing Position, Turn to Look  Behind Over each Shoulder: Looks behind one side only/other side shows less weight shift Turn 360 Degrees: Able to turn 360 degrees safely in 4 seconds or less Standing Unsupported, Alternately Place Feet on Step/Stool: Needs assistance to keep from falling or unable to try Standing Unsupported, One Foot in Front: Able to plae foot ahead of the other independently and hold 30 seconds Standing on One Leg: Tries to lift leg/unable to hold 3 seconds but remains standing independently Total Score: 43  Dynamic Gait Index Level Surface: Mild Impairment Change in Gait Speed: Mild Impairment Gait with Horizontal Head Turns: Mild Impairment Gait with Vertical Head Turns: Mild Impairment Gait and Pivot Turn: Moderate Impairment Step Over Obstacle: Mild Impairment Step Around Obstacles: Mild Impairment Steps: Moderate Impairment Total Score: 14  Static Standing Balance Static Standing - Balance Support: No upper extremity supported;During functional activity Static Standing - Level of Assistance: 4: Min assist Extremity Assessment  RUE Assessment RUE Assessment: Exceptions to [facility] (rt mild pronator drift) RUE Strength RUE Overall Strength: Deficits (4-/5) LUE Assessment LUE Assessment: Within Functional Limits      Recommendations for other services: None  Discharge Criteria: Patient will be discharged from PT if patient refuses treatment 3 consecutive times without medical reason, if treatment goals not met, if there is a change in medical status, if patient makes no progress towards goals or if patient is discharged from hospital.  The above assessment, treatment plan, treatment alternatives and goals were discussed and mutually agreed upon: by patient  Christianne Dolin 04/28/2011, 12:35 PM

## 2011-04-28 NOTE — Progress Notes (Addendum)
[facility]patient [facility] Individual Statement of Services  Patient Name:  [name]  Date:  04/28/2011  Welcome to the [facility]patient [facility].  Our goal is to provide you with an individualized program based on your diagnosis and situation, designed to meet your specific needs.  With this comprehensive rehabilitation program, you will be expected to participate in at least 3 hours of rehabilitation therapies Monday-Friday, with modified therapy programming on the weekends.  Your rehabilitation program will include the following services:  Physical Therapy (PT), Occupational Therapy (OT), Speech Therapy (ST), 24 hour per day rehabilitation nursing, Therapeutic Recreaction (TR), Case Management (RN and Child psychotherapist), Rehabilitation Medicine, Nutrition Services and Pharmacy Services  Weekly team conferences will be held on  Tuesday  to discuss your progress.  Your RN Case Designer, television/film set will talk with you frequently to get your input and to update you on team discussions.  Team conferences with you and your family in attendance may also be held.  Depending on your progress and recovery, your program may change.  Your RN Case Estate agent will coordinate services and will keep you informed of any changes.  Your RN Sports coach and SW names and contact numbers are listed  below.  The following services may also be recommended but are not provided by the Inpatient Rehabilitation [facility]:   Driving Evaluations  Home Health Rehabiltiation Services  Outpatient Rehabilitatation [facility]  Vocational Rehabilitation   Arrangements will be made to provide these services after discharge if needed.  Arrangements include referral to agencies that provide these services.  Your insurance has been verified to be:  none Your primary doctor is:  none  Pertinent information will be shared with your doctor and your insurance company.  Case Manager: Melanee Spry,  [facility] [phone]  Social Worker:  Amada Jupiter, [place] [phone]  ELOS: 10 days                                           Goal: Supervision  Information discussed with pt and his sister and copy was given to them by: Brock Ra, 04/28/2011, 1:22 PM

## 2011-04-28 NOTE — Progress Notes (Signed)
Speech Language Pathology Assessment and Plan  Patient Details  Name: [name] MRN: [id number] Date of Birth: Sep 24, 1950  SLP Diagnosis: Expressive Aphasia, apraxia, dysarthria oral-pharyngeal dysphagia  Rehab Potential: Good ELOS: 10 days  Session 1 Time: 4098-1191 Time Calculation: 60 mins  Session 2Time: 1530-1600 Time Calculation (min): 30 min  Assessment & Plan Clinical Impression: Patient is a 60 y.o. year old male with recent admission to the [facility] on 04/21/11  with right sided weakness and right facial droop. 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 Patient transferred to CIR on 04/27/2011 .  Patient's past medical history is significant for DM, DVT in the past, and polysubstance abuse. Pt prsents to CIR with moderate-severe expressive aphasia effecting all four modalities of language, verbal apraxia and dysarthria impacting functional communication. Pt also demonstrates moderate oral-pharyngeal dysphagia and is currently tolerating a Dys. 3 diet and nectar-thick liquids. Pt would benefit from skilled SLP services to maximize functional communication and swallow safety with least restrictive diet.   SLP - End of Session Activity Tolerance: Endurance does not limit participation in activity Patient left: in chair Nurse Communication: Diet recommendation;Swallow strategies reviewed;Other (comment) (signs placed HOB and outside pt's room) Assessment Rehab Potential: Good Barriers to Discharge:  (severity of aphasia/apraxia) Therapy Diagnosis: Aphasia;Dysarthria;Apraxia;Speech and Language deficits Type of Aphasia: Unable to differentiate diagnosis Type of Dysarthria: Flaccid  Precautions/Restrictions  Precautions Precautions: Fall;Other (comment) (thickened liquids) Required Braces or Orthoses: No Restrictions Weight Bearing Restrictions: No  Vital Signs Therapy Vitals Temp: 98.2 F (36.8 C) Temp src: Oral Pulse Rate: 85  Resp: 19  BP:  143/78 mmHg Patient Position, if appropriate: Sitting Oxygen Therapy SpO2: 99 % O2 Device: None (Room air) Pain Pain Assessment Pain Assessment: 0-10 Pain Score:   2 Pain Type: Chronic pain Pain Location: Knee Pain Orientation: Right Pain Descriptors: Aching;Other (Comment) (pain with weight-bearing) Pain Onset: On-going Patients Stated Pain Goal: 0 Pain Intervention(s): Ambulation/increased activity;Emotional support Multiple Pain Sites: No Prior Functioning Type of Home: House (sister's) Lives With: Alone;Other (Comment) (will discharge to sister's house) Receives Help From: Family Cognition Overall Cognitive Status: Appears within functional limits for tasks assessed Arousal/Alertness: Awake/alert Orientation Level: Other (Comment) (expressive aphasia: answered Y/N questions appropraitely) Attention: Selective Selective Attention: Appears intact Memory: Appears intact Awareness: Impaired (pt up in room without assistance) Awareness Impairment: Emergent impairment Problem Solving: Impaired Problem Solving Impairment: Functional basic Safety/Judgment: Impaired Comments: decreased safety awareness Comprehension Auditory Comprehension Yes/No Questions: Within Functional Limits Commands: Impaired One Step Basic Commands: 75-100% accurate Two Step Basic Commands: 75-100% accurate Multistep Basic Commands: 50-74% accurate Conversation: Simple Interfering Components: Motor planning;Processing speed EffectiveTechniques: Extra processing time;Repetition Visual Recognition/Discrimination Discrimination: Within Function Limits Reading Comprehension Reading Status: Impaired Word level: Within functional limits Sentence Level: Within functional limits Paragraph Level: Impaired Functional Environmental (signs, name badge): Within functional limits Interfering Components:  (language impairment) Expression Expression Primary Mode of Expression: Nonverbal - gestures Verbal  Expression Initiation: No impairment Automatic Speech: Name;Counting;Day of week Level of Generative/Spontaneous Verbalization: Word Repetition: Impaired Level of Impairment: Word level Naming: Impairment Responsive: 76-100% accurate Confrontation: Impaired Convergent: Not tested Divergent: Not tested Verbal Errors: Aware of errors Pragmatics: No impairment Interfering Components:  (motor planning) Effective Techniques: Sentence completion;Phonemic cues;Written cues;Other (Comment) (Y/N questions) Non-Verbal Means of Communication: Writing;Gestures Other Verbal Expression Comments: pt demonstrates expressive aphasia and apraxia Written Expression Dominant Hand: Right Written Expression: Exceptions to [facility] Self Formulation Ability: Word;Letter Interfering Components: Other (comment) (language impairment) Effective Techniques: Tactile/Visual cues;Verbal/Language cues  Recommendations for other services: None  Discharge Criteria: Patient will be discharged from SLP if patient refuses treatment 3 consecutive times without medical reason, if treatment goals not met, if there is a change in medical status, if patient makes no progress towards goals or if patient is discharged from hospital.  The above assessment, treatment plan, treatment alternatives and goals were discussed and mutually agreed upon: by patient  Session 1: Administered BSE and initiated cognitive linguistic evaluation  Session 2: Completed cognitive linguistic evaluation   Angelissa Supan 04/28/2011 4:59 PM   Clinical/Bedside Swallow Evaluation Patient Details  Name: [name] MRN: [id number] DOB: 01/26/1951 Today's Date: 04/28/2011  Past Medical History:  Past Medical History  Diagnosis Date  . History of deep vein thrombosis 2006  . Hypertension   . Diabetes mellitus   . Gout current and history of  . History of tobacco abuse     has smoked for 50 years  . Cocaine abuse     + UDS on  admission   Past Surgical History: No past surgical history on file.  Assessment/Recommendations/Treatment Plan Pt had MBSS at [facility] on 04/27/11 and recommended current diet with strict compensatory strategies. Pt then transferred to CIR that same day.  Pt administered BSE today. Pt overall supervision for appropriate use of compensatory strategies (complete chin tuck) but without overt s/s of aspiration with nectar thick liquids or dys. 3 textures. Pt demonstrated slower mastication of textures but no pocketing or oral residue noted. Recommend pt continue with current diet of Dys.3 textures and nectar-thick liquids. Pt will need another MBSS to assess for possible upgrade in 5-7 days.   SLP Assessment Risk for Aspiration: Moderate  Recommendations Solid Consistency: Dysphagia 3 (Mechanical soft) Liquid Consistency: Nectar Liquid Administration via: Cup Medication Administration: Whole meds with puree Supervision: Full supervision/cueing for compensatory strategies Compensations: Small sips/bites;Multiple dry swallows after each bite/sip;Check for pocketing;Clear throat intermittently Postural Changes and/or Swallow Maneuvers: Seated upright 90 degrees;Head turn right during swallow;Chin tuck Oral Care Recommendations: Oral care QID Other Recommendations: Order thickener from pharmacy;Remove water pitcher;Prohibited food (jello, ice cream, thin soups)  Prognosis Prognosis for Safe Diet Advancement: Good Barriers to Reach Goals: Severity of dysphagia;Language deficits  Individuals Consulted Consulted and Agree with Results and Recommendations: Patient  Swallow Study Goals  SLP Swallowing Goals Patient will consume recommended diet without observed clinical signs of aspiration with: Modified independent assistance Patient will utilize recommended strategies during swallow to increase swallowing safety with: Modified independent assistance  Swallow Study Prior Functional Status  Type  of Home: House (sister's) Lives With: Alone;Other (Comment) (will discharge to sister's house) Receives Help From: Family  General  Date of Onset: 04/21/11 Type of Study: Bedside swallow evaluation Diet Prior to this Study: Dysphagia 3 (soft);Nectar-thick liquids Temperature Spikes Noted: No Respiratory Status: Room air History of Intubation: No Behavior/Cognition: Alert;Cooperative;Pleasant mood Oral Cavity - Dentition: Poor condition;Missing dentition Vision: Functional for self-feeding Patient Positioning: Upright in chair Baseline Vocal Quality: Normal Volitional Cough: Strong Volitional Swallow: Able to elicit Ice chips: Not tested   Short-Term Goals 1. Pt will utilize multi-modal communication (writing, gestures) to express wants/needs with Mod A verbal cues. 2. Pt will demonstrate increased intelligibility at the word level with 75% accuracy with Mod A verbal and visual cues. 3. Pt will verbally express wants/needs at the word level with Mod A verbal and question cues.  4. Pt will utilize swallowing compensatory strategies with current diet with Mod I. 5. Pt will consume Dys. 3 textures and  nectar-thick liquids without overt s/s of aspiration with Mod I.      Jalessa Peyser 04/28/2011,5:08 PM

## 2011-04-28 NOTE — Progress Notes (Signed)
Subjective/Complaints:  Objective:Review of Systems  HENT: Negative for nosebleeds.   Eyes: Negative for blurred vision.  Respiratory: Negative for cough.   Cardiovascular: Negative for chest pain and palpitations.  Gastrointestinal: Positive for vomiting and abdominal pain. Negative for nausea.  Musculoskeletal: Positive for joint pain (left knee pain last night affected sleep.).  Neurological: Negative for speech change and headaches.  Psychiatric/Behavioral: Negative for depression. The patient is not nervous/anxious.     Vital Signs: Blood pressure 132/83, pulse 62, temperature 98.2 F (36.8 C), temperature source Oral, resp. rate 18, height 6\' 2"  (1.88 m), weight 98.204 kg (216 lb 8 oz), SpO2 96.00%. No results found.  Basename 04/28/11 0625  WBC 4.6  HGB 9.7*  HCT 29.7*  PLT 192    Basename 04/28/11 0625  NA 140  K 3.7  CL 102  CO2 27  GLUCOSE 100*  BUN 14  CREATININE 0.83  CALCIUM 8.9   CBG (last 3)   Basename 04/28/11 0710 04/27/11 2032 04/27/11 1613  GLUCAP 100* 106* 103*    Wt Readings from Last 3 Encounters:  04/27/11 98.204 kg (216 lb 8 oz)  04/26/11 72.576 kg (160 lb)    Physical Exam:  General appearance: alert and cooperative Head: Normocephalic, without obvious abnormality, atraumatic Eyes: negative.  Conjunctivae injected Throat: normal findings: lips normal without lesions Neck: no adenopathy, no JVD and supple, symmetrical, trachea midline Back: negative, symmetric,  Resp: clear to auscultation bilaterally Cardio: regular rate and rhythm GI: soft, non-tender; bowel sounds normal; no masses,  no organomegaly Extremities: Moderate edema right knee with dry scabs.  Tenderness with ROM and palpation.  2+ edema RLE.  1+ edema LLE Skin: stasis changes BLE.  Dry scabs right knee.  Old scars right lip and right chest wall. Neurologic: Dysarthric but better.  Verbal apraxia, expressive aphasia. Very alert.  Right pronator drift.  Decreased FMC RUE,   Strength grossly 4/5 RUE prox greater than distal.    Assessment/Plan: 1. 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 Physiatrist is providing close team supervision and 24 hour management of active medical problems listed below. Physiatrist and rehab team continue to assess barriers to discharge/monitor patient progress toward functional and medical goals. Mobility:         ADL:   Cognition: Cognition Orientation Level: Oriented X4 Cognition Orientation Level: Oriented X4   Medical Problem List and Plan:   1. DVT Prophylaxis/Anticoagulation: Pharmaceutical: Lovenox   2. Pain Management: complete steroid taper.Will titrate colchicine to tolerance. Prednisone taper. Right knee pain is in the main factor for gait.   3. Mood: reported to be labile. Currently without distress. Monitor.   4. 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   5. Diabetes mellitus: Monitor with AC/HS cbg checks. CM diet for now. Add oral agent if BS not controlled. Steroid taper.  6. HTN (hypertension): Monitor with bid checks. On nitro patch, metoprolol.  Coreg discontinued 24+hours ago per RN at [facility].  Monitor for now.  7. Chronic Anemia? : Check anemia panel   10. H/O DVT (deep venous thrombosis): Add lovenox.TEDs for edema control.   11. Gout: On steroid taper. Add colchicine.  Needed ultram last night which was effective.  12. Thrush: Added diflucan.        LOS (Days) 1 A FACE TO FACE EVAL  WAS PERFORMED WITH THIS PATIENT   04/28/2011, 7:46 AM

## 2011-04-28 NOTE — Progress Notes (Addendum)
Physical Therapy Assessment and Plan  Patient Details  Name: [name] MRN: [id number] Date of Birth: 07-27-1950  PT Diagnosis: Abnormality of gait, Hemiparesis dominant, Impaired sensation, Muscle weakness and Pain in joint Rehab Potential: Good ELOS: 10 days   Today's Date: 04/28/2011 Time: 4098-1191 Time Calculation (min): 60 min  Assessment & Plan Clinical Impression: Patient is a 60 y.o. 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  PTA, patient lived alone in an apartment with 2 steps to enter.  PTA, patient was independent with ADL's, IADL's, all mobility and was driving.  Patient currently presents with expressive aphasia, decreased balance, impaired gait, right-sided weakness, decreased sensation, right knee pain, decreased coordination and decreased functional mobility.  Patient will benefit from skilled inpatient PT to address the above impairments and provide family education for safe discharge to sister's house with anticipated modified independence level of supervision.     PT - End of Session Activity Tolerance: Tolerates 30+ min activity with multiple rests PT Assessment Rehab Potential: Good Barriers to Discharge: None PT Plan PT Frequency: 1-2 X/day, 60-90 minutes Estimated Length of Stay: 10 days PT Treatment/Interventions: Ambulation/gait training;Balance/vestibular training;Cognitive remediation/compensation;Community reintegration;Functional mobility training;Neuromuscular re-education;Pain management;Patient/family education;Stair training;Therapeutic Activities;Therapeutic Exercise;UE/LE Strength taining/ROM;UE/LE Coordination activities;DME/adaptive equipment instruction PT Recommendation Recommendations for Other Services: Speech consult Follow Up Recommendations: Outpatient  PT  Precautions/Restrictions Precautions Precautions: Fall;Other (comment) (thickened liquids) Required Braces or Orthoses: No Restrictions Weight Bearing Restrictions: No  Pain Pain Assessment Pain Assessment: 0-10 Pain Score:   2 Pain Type: Chronic pain Pain Location: Knee Pain Orientation: Right Pain Descriptors: Aching;Other (Comment) (pain with weight-bearing) Pain Onset: On-going Patients Stated Pain Goal: 0 Pain Intervention(s): Ambulation/increased activity;Emotional support Multiple Pain Sites: No Home Living/Prior Functioning Home Living Lives With: Alone;Other (Comment) (will discharge to sister's house) Receives Help From: Family Type of Home: House (sister's) Home Layout: Two level Home Access: Stairs to enter Home Adaptive Equipment: None Prior Function Level of Independence: Independent with basic ADLs;Independent with homemaking with ambulation;Independent with transfers;Independent with gait Able to Take Stairs?: Yes Driving: Yes Cognition Overall Cognitive Status: Appears within functional limits for tasks assessed Arousal/Alertness: Awake/alert Orientation Level: Other (Comment) (expressive aphasia; yes/no responses) Attention: Selective Selective Attention: Appears intact Safety/Judgment: Impaired Comments: decreased safety awareness Sensation Sensation Light Touch: Impaired by gross assessment (bilat fingers numb/tingling) Stereognosis: Not tested Hot/Cold: Not tested Proprioception: Not tested Coordination Gross Motor Movements are Fluid and Coordinated: No Fine Motor Movements are Fluid and Coordinated: Not tested Motor  Motor Motor: Motor apraxia;Hemiplegia Motor - Skilled Clinical Observations: Right sided weakness UE>LE  Mobility Bed Mobility Bed Mobility: No Transfers Transfers: Yes Sit to Stand: 5: Supervision;With upper extremity assist;From chair/3-in-1 Sit to Stand Details: Verbal cues for technique;Visual cues/gestures for  precautions/safety Stand to Sit: 5: Supervision;With upper extremity assist;To chair/3-in-1 Stand Pivot Transfer Details: Verbal cues for technique;Visual cues/gestures for precautions/safety Squat Pivot Transfers: 5: Supervision;With upper extremity assistance;With armrests Squat Pivot Transfer Details: Visual cues/gestures for precautions/safety Locomotion  Ambulation Ambulation: Yes Ambulation/Gait Assistance: 4: Min assist Ambulation Distance (Feet): 100 Feet Assistive device: None Ambulation/Gait Assistance Details: Manual facilitation for weight shifting;Manual facilitation for weight bearing Gait Gait: Yes Gait Pattern: Impaired Gait Pattern: Decreased stance time - right;Decreased step length - left;Step-through pattern;Antalgic Stairs / Additional Locomotion Stairs: Yes Stairs Assistance: 5: Supervision Stairs Assistance Details: Visual cues/gestures for precautions/safety;Verbal cues for precautions/safety Stair Management Technique:  Two rails;Alternating pattern;Step to pattern Number of Stairs: 9  Height of Stairs: 4  Wheelchair Mobility Wheelchair Mobility: Yes Wheelchair Assistance: 4: Administrator, sports Details: Verbal cues for technique;Visual cues/gestures for Copy: Both upper extremities Wheelchair Parts Management: Needs assistance Distance: 25  Trunk/Postural Assessment  Cervical Assessment Cervical Assessment: Within Functional Limits Thoracic Assessment Thoracic Assessment: Within Functional Limits Lumbar Assessment Lumbar Assessment: Within Functional Limits Postural Control Postural Control: Within Functional Limits  Balance Balance Balance Assessed: Yes Standardized Balance Assessment Standardized Balance Assessment: Berg Balance Test;Dynamic Gait Index Berg Balance Test Sit to Stand: Able to stand without using hands and stabilize independently Standing Unsupported: Able to stand safely 2  minutes Sitting with Back Unsupported but Feet Supported on Floor or Stool: Able to sit safely and securely 2 minutes Stand to Sit: Controls descent by using hands Transfers: Able to transfer safely, definite need of hands Standing Unsupported with Eyes Closed: Able to stand 10 seconds with supervision Standing Ubsupported with Feet Together: Able to place feet together independently and stand 1 minute safely From Standing, Reach Forward with Outstretched Arm: Can reach forward >12 cm safely (5") From Standing Position, Pick up Object from Floor: Able to pick up shoe safely and easily From Standing Position, Turn to Look Behind Over each Shoulder: Looks behind one side only/other side shows less weight shift Turn 360 Degrees: Able to turn 360 degrees safely in 4 seconds or less Standing Unsupported, Alternately Place Feet on Step/Stool: Needs assistance to keep from falling or unable to try Standing Unsupported, One Foot in Front: Able to plae foot ahead of the other independently and hold 30 seconds Standing on One Leg: Tries to lift leg/unable to hold 3 seconds but remains standing independently Total Score: 43  Dynamic Gait Index Level Surface: Mild Impairment Change in Gait Speed: Mild Impairment Gait with Horizontal Head Turns: Mild Impairment Gait with Vertical Head Turns: Mild Impairment Gait and Pivot Turn: Moderate Impairment Step Over Obstacle: Mild Impairment Step Around Obstacles: Mild Impairment Steps: Moderate Impairment Total Score: 14  Static Standing Balance Static Standing - Balance Support: No upper extremity supported Static Standing - Level of Assistance: 4: Min assist Extremity Assessment      RLE Assessment RLE Assessment:  (strength grossly 3/5, knee ROM limited by edema) LLE Assessment LLE Assessment: Within Functional Limits  Recommendations for other services: None  Discharge Criteria: Patient will be discharged from PT if patient refuses treatment 3  consecutive times without medical reason, if treatment goals not met, if there is a change in medical status, if patient makes no progress towards goals or if patient is discharged from hospital.  The above assessment, treatment plan, treatment alternatives and goals were discussed and mutually agreed upon: by patient  PT Treatment [phone]  Initial Eval completed.  Treatment initiated with Berg Balance Test score 43/56 and DGI 14/28.  Discussed results and safety due to increased fall risk with patient. Pt verbalizes understanding.  Christianne Dolin 04/28/2011, 4:01 PM

## 2011-04-29 LAB — GLUCOSE, CAPILLARY
Glucose-Capillary: 105 mg/dL — ABNORMAL HIGH (ref 70–99)
Glucose-Capillary: 65 mg/dL — ABNORMAL LOW (ref 70–99)

## 2011-04-29 LAB — URINE CULTURE
Colony Count: NO GROWTH
Culture  Setup Time: 201212122034
Culture: NO GROWTH

## 2011-04-29 LAB — FERRITIN: Ferritin: 540 ng/mL — ABNORMAL HIGH (ref 22–322)

## 2011-04-29 LAB — RETICULOCYTES
Retic Count, Absolute: 25.7 10*3/uL (ref 19.0–186.0)
Retic Ct Pct: 0.7 % (ref 0.4–3.1)

## 2011-04-29 LAB — IRON AND TIBC: Iron: 45 ug/dL (ref 42–135)

## 2011-04-29 LAB — VITAMIN B12: Vitamin B-12: 438 pg/mL (ref 211–911)

## 2011-04-29 MED ORDER — LISINOPRIL 10 MG PO TABS
10.0000 mg | ORAL_TABLET | Freq: Every day | ORAL | Status: DC
  Administered 2011-04-29 – 2011-05-05 (×7): 10 mg via ORAL
  Filled 2011-04-29 (×8): qty 1

## 2011-04-29 NOTE — Progress Notes (Signed)
Recreational Therapy Assessment and Plan  Patient Details  Name: [name] MRN: [id number] Date of Birth: 01-07-1951  Rehab Potential: Good ELOS: 10 days  Assessment Clinical Impression: Patient is a 60 y.o. year old male with with history of DM, DVT in the past, polysubstance abuse, admitted 12/05 to [facility] with right sided weakness and right facial droop. Patient on chronic coumadin? INR 1.1 at admission. 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 Carotid dopplers without ICA stenosis. 2D echo with EF 55% no wall abnormality. UDS positive for cocaine. Patient started on aspirin for CVA prophylaxis. Noted to have dysphagia with oro-motor weakness with drooling, and expressive deficits. Patient transferred to CIR on 04/27/2011 .  , decreased safety awareness and delayed processing and decreased standing balance, hemiplegia and decreased balance strategies. Pt also presents with dysphagia, expressive aphasia and oral apraxia. Prior to hospitalization, patient could complete ADLs with independence.  Pt presents with decreased activity tolerance, decreased functional mobility, decreased balance, right sided weakness,decreased safety, delayed processing, expressive aphasia limiting pts independence with leisure/community pursuits.   Recreational Therapy Leisure History/Participation Premorbid leisure interest/current participation: Games - Cards;Sports - Basketball;Games - Tree surgeon - Press photographer - Physicist, medical (shoot pool) Expression Interests: Music (Comment);Dance Other Leisure Interests: Television Leisure Participation Style: With Family/Friends Awareness of Community Resources: Good-identify 3 post discharge leisure resources [employer] Appropriate for Education?: Yes Patient Agreeable to [employer]?: Yes Patient agreeable to Pet Therapy: Yes Does patient have pets?: Yes (dog- pitt) Social interaction - Mood/Behavior:  Cooperative Recreational Therapy Orientation Orientation -Reviewed with patient: Available activity resources;Use of Dayroom Strengths/Weaknesses Patient Strengths/Abilities: Willingness to participate;Active premorbidly Patient weaknesses: Physical limitations  Plan Min 1 time per week >20 minutes  Recommendations for other services: None  Discharge Criteria: Patient will be discharged from TR if patient refuses treatment 3 consecutive times without medical reason.  If treatment goals not met, if there is a change in medical status, if patient makes no progress towards goals or if patient is discharged from hospital.  The above assessment, treatment plan, treatment alternatives and goals were discussed and mutually agreed upon: by patient  Jeris Easterly 04/29/2011, 4:30 PM

## 2011-04-29 NOTE — Progress Notes (Signed)
CBG: 65  Treatment: 15 GM carbohydrate snack  Symptoms: None  Follow-up CBG: Time:12:30 CBG Result:121  Possible Reasons for Event: Unknown  Comments/MD notified:Yes    [place], [name]

## 2011-04-29 NOTE — Progress Notes (Signed)
Speech Pathology: Dysphagia Treatment Note  Time: 0800-0900 Time Calculation: 60 minutes   Patient was observed with : Mechanical Soft textures and Nectar thick liquids.  Patient was noted to have s/s of aspiration : No  Patient required:  Supervision-Min A question cues to consistently follow precautions/strategies  Clinical Impression: Pt tolerating current diet without overt s/s of aspiration with intermittent question cues to utilize swallowing compensatory strategies of head turn to right, chin tuck, multiple swallows and intermittent throat clear.  Recommendations:  Continue current diet and POC.    Pain:   none Intervention Required:   No

## 2011-04-29 NOTE — Progress Notes (Signed)
Occupational Therapy Note  Patient Details  Name: [name] MRN: [id number] Date of Birth: 01/29/1951 Today's Date: 04/29/2011 Time: 1200 - 1230 30 min Pain - none Skilled Intervention: [employer] with focus on functional use R hand to self feed. occ hand over hand to complete hand to mouth pattern toward end of session secondary to RUE fatigue. Pt did not want to use LUE to self feed. Mod vc to chin tuck to R and double swallow. If distracted, pt at high risk to aspirate. Excellent participation.  Group session.   Tajah Noguchi,[name] 04/29/2011, 4:50 PM

## 2011-04-29 NOTE — Progress Notes (Signed)
Patient ID: [name], male   DOB: July 31, 1950, 60 y.o.   MRN: [id number] Subjective/Complaints:  Objective:Review of Systems  HENT: Negative for nosebleeds.   Eyes: Negative for blurred vision.  Respiratory: Negative for cough.   Cardiovascular: Negative for chest pain and palpitations.  Gastrointestinal: Positive for vomiting and abdominal pain. Negative for nausea.  Musculoskeletal: Positive for joint pain (left knee pain last night affected sleep.).  Neurological: Negative for speech change and headaches.  Psychiatric/Behavioral: Negative for depression. The patient is not nervous/anxious.     Vital Signs: Blood pressure 159/86, pulse 63, temperature 98.4 F (36.9 C), temperature source Oral, resp. rate 20, height 6\' 2"  (1.88 m), weight 98.204 kg (216 lb 8 oz), SpO2 98.00%. No results found.  Basename 04/28/11 0625  WBC 4.6  HGB 9.7*  HCT 29.7*  PLT 192    Basename 04/28/11 0625  NA 140  K 3.7  CL 102  CO2 27  GLUCOSE 100*  BUN 14  CREATININE 0.83  CALCIUM 8.9   CBG (last 3)   Basename 04/29/11 0720 04/28/11 2036 04/28/11 1624  GLUCAP 105* 139* 115*    Wt Readings from Last 3 Encounters:  04/27/11 98.204 kg (216 lb 8 oz)  04/26/11 72.576 kg (160 lb)    Physical Exam:  General appearance: alert and cooperative Head: Normocephalic, without obvious abnormality, atraumatic  Thrush on tongue Eyes: negative.  Conjunctivae injected Throat: normal findings: lips normal without lesions Neck: no adenopathy, no JVD and supple, symmetrical, trachea midline Back: negative, symmetric,  Resp: clear to auscultation bilaterally Cardio: regular rate and rhythm GI: soft, non-tender; bowel sounds normal; no masses,  no organomegaly Extremities: Moderate edema right knee with dry scabs.  Tenderness with ROM and palpation.  2+ edema RLE.  1+ edema LLE Skin: stasis changes BLE.  Dry scabs right knee.  Old scars right lip and right chest wall. Neurologic: Dysarthric but  better.  Verbal apraxia, expressive aphasia. Very alert.  Right pronator drift.  Decreased FMC RUE,  Strength grossly 4/5 RUE prox greater than distal.    Assessment/Plan: 1. 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 Physiatrist is providing close team supervision and 24 hour management of active medical problems listed below. Physiatrist and rehab team continue to assess barriers to discharge/monitor patient progress toward functional and medical goals. Mobility: Bed Mobility Bed Mobility: No Transfers Transfers: Yes Sit to Stand: 5: Supervision;With upper extremity assist;From chair/3-in-1 Stand to Sit: 5: Supervision;With upper extremity assist;To chair/3-in-1 Stand Pivot Transfers: 3: Mod assist Squat Pivot Transfers: 5: Supervision;With upper extremity assistance;With armrests Ambulation/Gait Ambulation/Gait Assistance: 4: Min assist Ambulation Distance (Feet): 100 Feet Assistive device: None Gait Pattern: Decreased stance time - right;Decreased step length - left;Step-through pattern;Antalgic Stairs: Yes Stairs Assistance: 5: Supervision Stair Management Technique: Two rails;Alternating pattern;Step to pattern Number of Stairs: 9  Height of Stairs: 4  Corporate treasurer: Yes Wheelchair Assistance: 4: Systems analyst: Both upper extremities Wheelchair Parts Management: Needs assistance Distance: 25 ADL:   Cognition: Cognition Overall Cognitive Status: Appears within functional limits for tasks assessed Arousal/Alertness: Awake/alert Orientation Level: Other (Comment) (follows commands; answer Y/N appropriately) Attention: Selective Selective Attention: Appears intact Memory: Appears intact Awareness: Impaired (pt up in room without assistance) Awareness Impairment: Emergent impairment Problem Solving:  Impaired Problem Solving Impairment: Functional basic Safety/Judgment: Impaired Comments: decreased safety awareness Cognition Arousal/Alertness: Awake/alert Orientation Level: Other (Comment) (follows commands; answer Y/N  appropriately)   Medical Problem List and Plan:   1. DVT Prophylaxis/Anticoagulation: Pharmaceutical: Lovenox   2. Pain Management: complete steroid taper.Will titrate colchicine to tolerance. Prednisone taper. Right knee pain is in the main factor for gait.   3. Mood: reported to be labile. Currently without distress. Monitor.   4. 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   5. Diabetes mellitus: Monitor with AC/HS cbg checks. CM diet for now. Sugars showing improvement.  6. HTN (hypertension): Monitor with bid checks. On nitro patch, metoprolol.  Will add lisinopril.   7. Chronic Anemia? : Check anemia panel   10. H/O DVT (deep venous thrombosis): Add lovenox.TEDs for edema control.   11. Gout: On steroid taper. Colchicine BID has helped.  Slept better last night.  12. Thrush: Added diflucan.        LOS (Days) 2 A FACE TO FACE EVAL WAS PERFORMED WITH THIS PATIENT   04/29/2011, 8:55 AM

## 2011-04-29 NOTE — Progress Notes (Signed)
Occupational Therapy Session Note  Patient Details  Name: [name] MRN: [id number] Date of Birth: February 20, 1951  Today's Date: 04/29/2011 Time: 0915-1011 Time Calculation (min): 56 min  Precautions: Precautions Precautions: Fall;Other (comment) (thickened liquids) Required Braces or Orthoses: No Restrictions Weight Bearing Restrictions: No  Short Term Goals: OT Short Term Goal 1: Pt will transfer to toilet with supervision OT Short Term Goal 2: Pt will perform bathing (10/10 parts) with supervision at shower level OT Short Term Goal 3:  Pt will perform toileting in standing with supervision OT Short Term Goal 4: Pt will be I with HEP for Right UE coordination  Skilled Therapeutic Interventions/Progress Updates:    ADL retraining including bathing in walk-in shower in room and dressing from EOB with sit to stand.  Pt dropped items from right hand X 3 but was aware and retrieved items.  RUE decreased strength and grasp noted throughout ADLs. Pt following 1 step commands with min verbal cues and gestures.  Pt exhibited difficulty communicating verbally but able to make needs known with white board to write on.  Focus on functional ambulation for home mgmt tasks, sequencing, RUE use for tasks, and safety awareness.     Pain Pain Assessment Pain Assessment: 0-10 (c/o gout in right knee - unable to rate) Pain Score: 0-No pain Pain Intervention(s): RN made aware  Therapy/Group: Individual Therapy  Rich Brave 04/29/2011, 12:32 PM

## 2011-04-29 NOTE — Progress Notes (Signed)
Physical Therapy Note  Patient Details  Name: [name] MRN: [id number] Date of Birth: 1951/01/29 Today's Date: 04/29/2011  Time: 1300-1345 45 minutes  Pt c/o R knee and hand pain. Swelling noted.  RN made aware.  Coordination and balance activity with cone taps to different numbered cones.  Pt able to complete 3 number series with min-mod questioning cues for correct sequence.  Pt demos delayed processing and motor planning.  Pt with min A for balance with cone taps.  Pt demos difficulty grading movements with R LE.  Standing horseshoe toss on foam for improved balance strategies and R UE use.  Pt with min A for balance.  Difficulty with release of horseshoe with R hand, able to perform after practice.  Picking up horseshoes from floor with supervision, increased time for R hand.  Dynamic gait to room with head turns all directions without LOB.  Individual Therapy   DONAWERTH,KAREN 04/29/2011, 1:52 PM

## 2011-04-29 NOTE — Progress Notes (Signed)
Late entry: Hypoglycemic episode. BG 65 @1200 .  BG 97 when rechecked after meal.

## 2011-04-29 NOTE — Progress Notes (Signed)
Speech Pathology: Dysphagia Treatment Note  Group Session  1130- 1200  Patient was observed with : Mechanical Soft, Dys.3 and Nectar liquids.  Patient was noted to have s/s of aspiration : Yes:  Intermittent throat clears   Patient required: moderate faded to minimal assist cues to consistently follow precautions/strategies  Clinical Impression: SLP facilitated session with initial education of safe swallow strategies and then facilitated with moderate faded to minimal semantic cues.  Cues to stop eating were also needed when patient would display throat clears until he had thoroughly cleared suspected residue and questionable penetration.   Recommendations:  Continue with current orders and full meal supervision  Pain:   none Intervention Required:   No  Goals: Progressing  Fae Pippin, M.A., CCC-SLP [phone]

## 2011-04-30 DIAGNOSIS — I6330 Cerebral infarction due to thrombosis of unspecified cerebral artery: Secondary | ICD-10-CM

## 2011-04-30 DIAGNOSIS — Z5189 Encounter for other specified aftercare: Secondary | ICD-10-CM

## 2011-04-30 LAB — GLUCOSE, CAPILLARY
Glucose-Capillary: 98 mg/dL (ref 70–99)
Glucose-Capillary: 118 mg/dL — ABNORMAL HIGH (ref 70–99)
Glucose-Capillary: 113 mg/dL — ABNORMAL HIGH (ref 70–99)

## 2011-04-30 NOTE — Progress Notes (Signed)
Speech Language Pathology Therapy Note  Patient Details  Name: [name] MRN: [id number] Date of Birth: 1951-01-14  Today's Date: 04/30/2011 Time: 1330-1430 Time Calculation: 60 minutes  Precautions: Precautions Precautions: Fall (thickened liquids) Required Braces or Orthoses: No Restrictions Weight Bearing Restrictions: No  Skilled Therapeutic Interventions: Treatment focus on functional communication. Pt able to name all basic items presented, however, pt unable to produce initial phoneme of each word (ex. Ball=all). Max A with visual, tactile and verbal cues to produce initial position of /b/ and /m/ and total A for all other phonemes. Written cue did not assist in production. Pt independently utilizes writing and gestures to make needs known.   Precautions/Restrictions  Precautions Precautions: Fall (thickened liquids)  Pain Pain Assessment Pain Assessment: No/denies pain  Oral/Motor: Oral Motor/Sensory Function Labial ROM: Reduced right Labial Symmetry: Abnormal symmetry right Labial Strength: Reduced Labial Sensation: Within Functional Limits Lingual ROM: Reduced right Lingual Symmetry: Abnormal symmetry right Lingual Strength: Reduced Lingual Sensation: Within Functional Limits Facial ROM: Within Functional Limits Facial Symmetry: Right droop Facial Strength: Within Functional Limits Facial Sensation: Within Functional Limits Velum: Within Functional Limits Mandible: Within Functional Limits Comprehension: Auditory Comprehension Yes/No Questions: Within Functional Limits Commands: Impaired One Step Basic Commands: 75-100% accurate Two Step Basic Commands: 75-100% accurate Multistep Basic Commands: 50-74% accurate Conversation: Simple Interfering Components: Motor planning;Processing speed EffectiveTechniques: Extra processing time;Repetition Visual Recognition/Discrimination Discrimination: Within Function Limits Reading Comprehension Reading  Status: Impaired Word level: Within functional limits Sentence Level: Within functional limits Paragraph Level: Impaired Functional Environmental (signs, name badge): Within functional limits Interfering Components:  (language impairment) Expression: Expression Primary Mode of Expression: Nonverbal - gestures Verbal Expression Initiation: No impairment Automatic Speech: Name;Counting;Day of week Level of Generative/Spontaneous Verbalization: Word Repetition: Impaired Level of Impairment: Word level Naming: Impairment Responsive: 76-100% accurate Confrontation: Impaired Convergent: Not tested Divergent: Not tested Verbal Errors: Aware of errors Pragmatics: No impairment Interfering Components:  (motor planning) Effective Techniques: Sentence completion;Phonemic cues;Written cues;Other (Comment) (Y/N questions) Non-Verbal Means of Communication: Writing;Gestures Other Verbal Expression Comments: pt demonstrates expressive aphasia and apraxia Written Expression Dominant Hand: Right Written Expression: Exceptions to [name] Self Formulation Ability: Word;Letter Interfering Components: Other (comment) (language impairment) Effective Techniques: Tactile/Visual cues;Verbal/Language cues  Therapy/Group: Individual Therapy  [name] 04/30/2011 3:30 PM

## 2011-04-30 NOTE — Progress Notes (Signed)
Social Work Assessment and Plan Assessment and Plan  Patient Name: [name]  BJYNW'G Date: 04/30/2011  Problem List:  Patient Active Problem List  Diagnoses  . CVA (cerebral infarction)  . Diabetes mellitus  . HTN (hypertension), malignant  . Anemia  . DVT (deep venous thrombosis)    Past Medical History:  Past Medical History  Diagnosis Date  . History of deep vein thrombosis 2006  . Hypertension   . Diabetes mellitus   . Gout current and history of  . History of tobacco abuse     has smoked for 50 years  . Cocaine abuse     + UDS on admission    Past Surgical History: No past surgical history on file.  Discharge Planning     Social/Family/Support Systems    Employment Status    Abuse/Neglect Abuse/Neglect Assessment (Assessment to be complete while patient is alone) Physical Abuse: Denies Verbal Abuse: Denies Sexual Abuse: Denies Exploitation of patient/patient's resources: Denies Self-Neglect: Denies  Emotional Status Emotional Status Pt's affect, behavior adn adjustment status: expressively aphasic gentleman sitting in room and attempts to answer basic questions via wipe off board.  Denies any significant emotional distress, yet cannot fully assess due to communication difficulties.  Will monitor. Recent Psychosocial Issues: none Pyschiatric History: none  Patient/Family Perceptions, Expectations & Goals Pt/Family Perceptions, Expectations and Goals Pt/Family understanding of illness & functional limitations: pt and family with basic understanding that he suffered a stroke and of obvious functional, speech limitations and need for CIR Premorbid pt/family roles/activities: completely independent PTA Anticipated changes in roles/activities/participation: sister to now become caregiver Pt/family expectations/goals: "get as much back as he can"  Careers adviser: None Premorbid Home Care/DME Agencies:  None Transportation available at discharge: yes Resource referrals recommended: Support group (specify) (Stroke and Aphasia support groups)  Discharge Assessment Discharge Planning Insurance Resources: Customer service manager Screen Referred: Yes Living Expenses: Rent Money Management: Patient Home Management: was Independent Patient/Family Preliminary Plans: Was living in boarding house PTA but now to d/c home with sister  Clinical Impression:  Unfortunate gentleman here after stroke and suffering significant expressive aphasia.  Does not appear outwardly emotionally distressed.  Will monitor.  Megan Salon, Quadasia [name] 04/30/2011

## 2011-04-30 NOTE — Progress Notes (Signed)
Speech Pathology: Dysphagia Treatment Note  Group Session Time: 1130-1200  Patient was observed with : Dys. 3 textures and Nectar-thick liquids.  Patient was noted to have s/s of aspiration : yes: throat clears and coughing episode x2  Lung Sounds:  WNL Temperature: WNL  Patient required: minimal assist cues to consistently follow precautions/strategies  Clinical Impression: SLP focused on minimal assist semantic cues to utilize head turn to right with chin tuck; patient with cough before meal secondary to decreased management of oral secretions with anterior loss as well as suspected penetration and cough toward of of meal secondary to fatigue.  Intermittent throat clear through out session suspected secondary to reside or possible penetration;  Patient had runny nose through out meal.  Given no change in temp or lung sounds current recommendations reduce risk of aspiration.  Recommendations:  Continue with current orders and plan of care.  Pain:   none Intervention Required:   No  Goals: Progressing   [name], M.A., CCC-SLP [phone]

## 2011-04-30 NOTE — Progress Notes (Signed)
Occupational Therapy Note  Patient Details  Name: [name] MRN: [id number] Date of Birth: 1951-03-09 Today's Date: 04/30/2011  1200-1230 Group session Pain:  No report of pain  Skilled clinical intervention:  Patient participated in [employer] today to address functional use of right upper extremity for self feeding, with and without utensils.  Patient able to utilize spoon and fork in RUE, although demonstrated exaggerated proximal pattern when distal extremity became fatigued.  Patient with apparent sensory loss in RUE, and needing occasional cueing to visually compensate.  Patient did an excellent job maintaining swallowing strategies, and safely swallowing despite lots of distractions in group setting.   Collier Salina 04/30/2011, 3:18 PM

## 2011-04-30 NOTE — Progress Notes (Signed)
Occupational Therapy Note  Patient Details  Name: Raylin Winer MRN: [id number] Date of Birth: [birth year] Today's Date: 04/30/2011  Time 815- No c/o pain- stated right knee felt ok  1:1 self care retraining: self feeding breakfast with right hand at dominant level with extra time for scooping food and for bring food to mouth secondary to decr motor planning, functional ambulation around room to collect items for bathing and dressing, attempting to name all objects with semantic cuing.  Bathed at shower level.  Focus on standing balance in shower for entire shower, safe use of grab bar to perform 1 limb stance to don and doff pants in dressing. Went through a container of familiar fine motor items to focus on manipulation of items.  Melonie [place] 04/30/2011, 9:02 AM

## 2011-04-30 NOTE — Progress Notes (Signed)
Physical Therapy Session Note  Patient Details  Name: [name] MRN: [id number] Date of Birth: 1951/03/18  Today's Date: 04/30/2011 Time: 1000-1058 Time Calculation (min): 58 min  Precautions: Precautions Precautions: Fall (thickened liquids) Required Braces or Orthoses: No Restrictions Weight Bearing Restrictions: No  General Chart Reviewed: Yes Family/Caregiver Present: Yes (friends) Pain Pain Assessment Pain Assessment: No/denies pain Pain Score: 0-No pain Mobility Bed Mobility Bed Mobility: No Transfers Transfers: Yes Sit to Stand: 5: Supervision;With upper extremity assist;From chair  Stand to Sit: 5: Supervision;With armrests;To chair;With upper extremity assist  Locomotion  Ambulation Ambulation: Yes Ambulation/Gait Assistance: 5: Supervision Ambulation Distance (Feet): 300 Feet Assistive device: None Gait Gait: Yes High Level Ambulation High Level Ambulation: Side stepping;Backwards walking (backwards stepping while tossing med ball) Stairs / Additional Locomotion Stairs: No Wheelchair Mobility Wheelchair Mobility: No (gait primary means of mobility)   Other Treatments   Gait ~236ft supervision focusing on stride length and equal stance time with tall posture.    Balance activities to improve dynamic balance and decrease risk of falling:  Tapping numbered cones with LE's in specific sequences min A, focusing on improving dynamic balancing while using cognitive skills to determine order pt required max questioning cues for sequencing  Reaching for and tossing horseshoes while standing on Airex foam min A 2x trials (1st ~58ft; 2nd ~75ft) and then picking them up individually from the ground.  Walking across mat to pick up or place specific colored pegs with RUE supervision, focusing on improving dynamic balancing, fine motor use of RUE and using cognitive skills to accomplish sequencing task.  Tossing med ball (start with 500g progressing to  1000g) on Airex foam supervision with bilat UE's, progressing to underhand tosses with RUE to simulate playing horseshoes at home. High level gait activities to improve dynamic balance during obstacle negotiation while multi-tasking in order to decrease the risk of falls:  Tossing 1000g med ball while walking forwards and backwards supervision with both bilat UE's and RUE ~134ft  Stepping forward over obstacles with progressively larger steps  Sidestepping over obstacles, focusing on gluteal activation and standing tall.  Therapy/Group: Individual Therapy  Christianne Dolin 04/30/2011, 12:27 PM

## 2011-04-30 NOTE — Progress Notes (Signed)
Patient ID: [name], male   DOB: 1951-03-24, 60 y.o.   MRN: [id number] Patient ID: [name], male   DOB: 1950/08/03, 60 y.o.   MRN: [id number] Subjective/Complaints:  Objective:Review of Systems  HENT: Negative for nosebleeds.   Eyes: Negative for blurred vision.  Respiratory: Negative for cough.   Cardiovascular: Negative for chest pain and palpitations.  Gastrointestinal: Positive for vomiting and abdominal pain. Negative for nausea.  Musculoskeletal: Positive for joint pain (left knee pain last night affected sleep.).  Neurological: Negative for speech change and headaches.  Psychiatric/Behavioral: Negative for depression. The patient is not nervous/anxious.   no c/o today  Vital Signs: Blood pressure 170/91, pulse 86, temperature 98.7 F (37.1 C), temperature source Oral, resp. rate 18, height 6\' 2"  (1.88 m), weight 98.204 kg (216 lb 8 oz), SpO2 98.00%. No results found.  Basename 04/28/11 0625  WBC 4.6  HGB 9.7*  HCT 29.7*  PLT 192    Basename 04/28/11 0625  NA 140  K 3.7  CL 102  CO2 27  GLUCOSE 100*  BUN 14  CREATININE 0.83  CALCIUM 8.9   CBG (last 3)   Basename 04/30/11 0747 04/29/11 2039 04/29/11 1643  GLUCAP 98 126* 97    Wt Readings from Last 3 Encounters:  04/27/11 98.204 kg (216 lb 8 oz)  04/26/11 72.576 kg (160 lb)    Physical Exam:  General appearance: alert and cooperative Head: Normocephalic, without obvious abnormality, atraumatic  Thrush on tongue Eyes: negative.  Conjunctivae injected Throat: normal findings: lips normal without lesions Neck: no adenopathy, no JVD and supple, symmetrical, trachea midline Back: negative, symmetric,  Resp: clear to auscultation bilaterally Cardio: regular rate and rhythm GI: soft, non-tender; bowel sounds normal; no masses,  no organomegaly Extremities: Moderate edema right knee with dry scabs.  Tenderness with ROM and palpation.  2+ edema RLE.  1+ edema LLE Skin: stasis changes BLE.  Dry  scabs right knee.  Old scars right lip and right chest wall. Neurologic: Dysarthric but better.  Verbal apraxia, expressive aphasia. Very alert.  Right pronator drift.  Decreased FMC RUE,  Strength  4/5 RUE prox greater than distal.    Assessment/Plan: 1. 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 Physiatrist is providing close team supervision and 24 hour management of active medical problems listed below. Physiatrist and rehab team continue to assess barriers to discharge/monitor patient progress toward functional and medical goals. Mobility: Bed Mobility Bed Mobility: No Transfers Transfers: Yes Sit to Stand: 5: Supervision;With upper extremity assist;From chair/3-in-1 Stand to Sit: 5: Supervision;With upper extremity assist;To chair/3-in-1 Stand Pivot Transfers: 3: Mod assist Squat Pivot Transfers: 5: Supervision;With upper extremity assistance;With armrests Ambulation/Gait Ambulation/Gait Assistance: 4: Min assist Ambulation Distance (Feet): 100 Feet Assistive device: None Gait Pattern: Decreased stance time - right;Decreased step length - left;Step-through pattern;Antalgic Stairs: Yes Stairs Assistance: 5: Supervision Stair Management Technique: Two rails;Alternating pattern;Step to pattern Number of Stairs: 9  Height of Stairs: 4  Wheelchair Mobility Wheelchair Mobility: Yes Wheelchair Assistance: 4: Systems analyst: Both upper extremities Wheelchair Parts Management: Needs assistance Distance: 25 ADL:   Cognition: Cognition Overall Cognitive Status: Appears within functional limits for tasks assessed Arousal/Alertness: Awake/alert Orientation Level: Oriented X4 (difficult to assess d/t exp asphasia) Attention: Selective Selective Attention: Appears intact Memory: Appears intact Awareness: Impaired (pt up in room without  assistance) Awareness Impairment: Emergent impairment Problem Solving: Impaired Problem Solving Impairment: Functional  basic Safety/Judgment: Impaired Comments: decreased safety awareness Cognition Arousal/Alertness: Awake/alert Orientation Level: Oriented X4 (difficult to assess d/t exp asphasia)   Medical Problem List and Plan:   1. DVT Prophylaxis/Anticoagulation: Pharmaceutical: Lovenox   2. Pain Management: complete steroid taper.Will titrate colchicine to tolerance. Prednisone taper. Right knee pain is in the main factor for gait.   3. Mood: reported to be labile. Currently without distress. Monitor.   4. 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   5. Diabetes mellitus: Monitor with AC/HS cbg checks. CM diet for now. Sugars showing improvement.  6. HTN (hypertension): Monitor with bid checks. On nitro patch, metoprolol.  Lisinopril added. May need further titration.  7. Chronic Anemia? : Check anemia panel   10. H/O DVT (deep venous thrombosis): Add lovenox.TEDs for edema control.   11. Gout: On steroid taper. Colchicine BID has helped.  Sleeping better.  12. Thrush: Added diflucan.        LOS (Days) 3 A FACE TO FACE EVAL WAS PERFORMED    04/30/2011, 8:19 AM

## 2011-05-01 DIAGNOSIS — Z5189 Encounter for other specified aftercare: Secondary | ICD-10-CM

## 2011-05-01 DIAGNOSIS — I6330 Cerebral infarction due to thrombosis of unspecified cerebral artery: Secondary | ICD-10-CM

## 2011-05-01 LAB — GLUCOSE, CAPILLARY
Glucose-Capillary: 101 mg/dL — ABNORMAL HIGH (ref 70–99)
Glucose-Capillary: 92 mg/dL (ref 70–99)
Glucose-Capillary: 99 mg/dL (ref 70–99)

## 2011-05-01 NOTE — Progress Notes (Signed)
Patient ID: [name], male   DOB: 01-13-1951, 60 y.o.   MRN: [id number] Subjective/Complaints:   Objective:Review of Systems  HENT: Negative for nosebleeds.   Eyes: Negative for blurred vision.  Respiratory: Negative for cough.   Cardiovascular: Negative for chest pain and palpitations.  Gastrointestinal: Negative for nausea, vomiting and abdominal pain.  Musculoskeletal: Positive for joint pain (left knee pain last night affected sleep.).  Neurological: Negative for speech change and headaches.  Psychiatric/Behavioral: Negative for depression. The patient is not nervous/anxious.   no c/o today  Vital Signs: Blood pressure 163/95, pulse 58, temperature 99.2 F (37.3 C), temperature source Oral, resp. rate 20, height 6\' 2"  (1.88 m), weight 98.204 kg (216 lb 8 oz), SpO2 97.00%. No results found. No results found for this basename: WBC:2,HGB:2,HCT:2,PLT:2 in the last 72 hours No results found for this basename: NA:2,K:2,CL:2,CO2:2,GLUCOSE:2,BUN:2,CREATININE:2,CALCIUM:2 in the last 72 hours CBG (last 3)   Basename 04/30/11 2129 04/30/11 1626 04/30/11 1158  GLUCAP 111* 113* 118*    Wt Readings from Last 3 Encounters:  04/27/11 98.204 kg (216 lb 8 oz)  04/26/11 72.576 kg (160 lb)    Physical Exam:  General appearance: alert and cooperative Head: Normocephalic, without obvious abnormality, atraumatic  Thrush on tongue Eyes: negative.  Conjunctivae injected Throat: normal findings: lips normal without lesions Neck: no adenopathy, no JVD and supple, symmetrical, trachea midline Back: negative, symmetric,  Resp: clear to auscultation bilaterally Cardio: regular rate and rhythm GI: soft, non-tender; bowel sounds normal; no masses,  no organomegaly Extremities: Moderate edema right knee with dry scabs.  Tenderness with ROM and palpation.  2+ edema RLE.  1+ edema LLE Skin: stasis changes BLE.  Dry scabs right knee.  Old scars right lip and right chest wall. Neurologic:  Dysarthric but better.  Verbal apraxia, expressive aphasia. Very alert.  Right pronator drift.  Decreased FMC RUE,  Strength  4/5 RUE prox greater than distal.    Assessment/Plan: 1. 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 Physiatrist is providing close team supervision and 24 hour management of active medical problems listed below. Physiatrist and rehab team continue to assess barriers to discharge/monitor patient progress toward functional and medical goals. Mobility: Bed Mobility Bed Mobility: No Transfers Transfers: Yes Sit to Stand: 5: Supervision;With upper extremity assist;From chair/3-in-1 Stand to Sit: 5: Supervision;With armrests;To chair/3-in-1;With upper extremity assist Stand Pivot Transfers: 3: Mod assist Squat Pivot Transfers: 5: Supervision;With upper extremity assistance;With armrests Ambulation/Gait Ambulation/Gait Assistance: 5: Supervision Ambulation Distance (Feet): 300 Feet Assistive device: None Gait Pattern: Decreased stance time - right;Decreased step length - left;Step-through pattern;Antalgic Stairs: No Stairs Assistance: 5: Supervision Stair Management Technique: Two rails;Alternating pattern;Step to pattern Number of Stairs: 9  Height of Stairs: 4  Wheelchair Mobility Wheelchair Mobility: No (gait primary means of mobility) Wheelchair Assistance: 4: Min Education officer, museum: Both upper extremities Wheelchair Parts Management: Needs assistance Distance: 25 ADL:   Cognition: Cognition Overall Cognitive Status: Appears within functional limits for tasks assessed Arousal/Alertness: Awake/alert Orientation Level: Other (Comment) (expressive aphasia; answer Y/N appropriately) Attention: Selective Selective Attention: Appears intact Memory: Appears intact Awareness: Impaired (pt up in room without  assistance) Awareness Impairment: Emergent impairment Problem Solving: Impaired Problem Solving Impairment: Functional basic Safety/Judgment: Impaired Comments: decreased safety awareness Cognition Arousal/Alertness: Awake/alert Orientation Level: Other (Comment) (expressive aphasia; answer Y/N appropriately)   Medical Problem List and Plan:   1. DVT Prophylaxis/Anticoagulation: Pharmaceutical: Lovenox   2.  Pain Management: complete steroid taper.Will titrate colchicine to tolerance. Prednisone taper. Right knee pain is in the main factor for gait.   3. Mood: reported to be labile. Currently without distress. Monitor.   4. 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   5. Diabetes mellitus: Monitor with AC/HS cbg checks. CM diet for now. Sugars showing improvement.  6. HTN (hypertension): Monitor with bid checks. On nitro patch, metoprolol.  Lisinopril added. May need further titration.  7. Chronic Anemia? : Check anemia panel   10. H/O DVT (deep venous thrombosis): Add lovenox.TEDs for edema control.   11. Gout: On steroid taper. Colchicine BID has helped.  Sleeping better.  12. Thrush: Added diflucan.        LOS (Days) 4 A FACE TO FACE EVAL WAS PERFORMED    05/01/2011, 7:43 AM

## 2011-05-01 NOTE — Progress Notes (Signed)
Occupational Therapy Session Note  Patient Details  Name: [name] MRN: [id number] Date of Birth: 06/27/1950  Today's Date: 05/01/2011 Time: 7846-9629 Time Calculation (min): 45 min  Precautions: Precautions Precautions: Fall (thickened liquids) Required Braces or Orthoses: No Restrictions Weight Bearing Restrictions: No  Short Term Goals: OT Short Term Goal 1: Pt will transfer to toilet with supervision OT Short Term Goal 2: Pt will perform bathing (10/10 parts) with supervision at shower level OT Short Term Goal 3:  Pt will perform toileting in standing with supervision OT Short Term Goal 4: Pt will be I with HEP for Right UE coordination  Skilled Therapeutic Interventions/Progress Updates: toileting and ADL in w/c at sink with focus on bilateral upper extremity use and right hand coordination; otherwise, balance good for self care today    Pain none   ADL ADL Eating: Maximal cueing;Set up Where Assessed-Eating: Chair Upper Body Bathing: Setup;Minimal cueing Where Assessed-Upper Body Bathing: Sitting at sink Lower Body Bathing: Moderate assistance Where Assessed-Lower Body Bathing: Standing at sink;Sitting at sink Upper Body Dressing: Setup Where Assessed-Upper Body Dressing: Sitting at sink Lower Body Dressing: Maximal assistance;Minimal cueing Where Assessed-Lower Body Dressing: Standing at sink;Sitting at sink Toilet Transfer Method: Not assessed Tub/Shower Transfer: Not assessed     Therapy/Group: Individual Therapy  Bud Face [facility] 05/01/2011, 1:30 PM

## 2011-05-01 NOTE — Progress Notes (Signed)
Progress Notes  Speech Language Pathology Therapy Note  Patient Details  Name: [name] MRN: [id number] Date of Birth: 11-20-1950  Today's Date: 05/01/2011 Time: 1308-6578 Time Calculation (min): 45 min  Precautions: Precautions Precautions: Fall (thickened liquids) Required Braces or Orthoses: No Restrictions Weight Bearing Restrictions: No  Short Term Goals:  Skilled Therapeutic Interventions/Progress Updates:  Precautions/Restrictions    General    Vital Signs Therapy Vitals Temp: 98.4 F (36.9 C) Temp src: Oral Pulse Rate: 56  Resp: 17  BP: 150/91 mmHg Patient Position, if appropriate: Lying Oxygen Therapy SpO2: 99 % O2 Device: None (Room air) Pain Pain Assessment Pain Assessment: No/denies pain  Oral/Motor: Oral Motor/Sensory Function Labial ROM: Reduced right Labial Symmetry: Abnormal symmetry right Labial Strength: Reduced Labial Sensation: Reduced Lingual ROM: Reduced right Lingual Symmetry: Abnormal symmetry right Lingual Strength: Reduced Lingual Sensation: Reduced Facial ROM: Within Functional Limits Facial Symmetry: Right droop Facial Strength: Within Functional Limits Facial Sensation: Within Functional Limits Velum: Within Functional Limits Mandible: Within Functional Limits Motor Speech Intelligibility: Intelligible Comprehension: Auditory Comprehension Yes/No Questions: Within Functional Limits Commands: Impaired One Step Basic Commands: 75-100% accurate Two Step Basic Commands: 75-100% accurate Multistep Basic Commands: 50-74% accurate Conversation: Simple Interfering Components: Motor planning;Processing speed EffectiveTechniques: Extra processing time Visual Recognition/Discrimination Discrimination: Within Function Limits Reading Comprehension Reading Status: Impaired Word level: Within functional limits Sentence Level: Within functional limits Paragraph Level: Impaired Functional Environmental (signs, name badge):  Within functional limits Interfering Components:  (language impairment) Expression: Expression Primary Mode of Expression: Nonverbal - gestures Verbal Expression Initiation: Impaired Automatic Speech: Name;Counting;Day of week Level of Generative/Spontaneous Verbalization: Word Repetition: Impaired Level of Impairment: Word level (syllable level) Naming: Impairment Responsive: 76-100% accurate Confrontation: Impaired Convergent: Not tested Divergent: Not tested Verbal Errors: Aware of errors Pragmatics: No impairment Interfering Components:  (motor planning) Effective Techniques: Sentence completion;Semantic cues;Phonemic cues;Written cues Non-Verbal Means of Communication: Gestures;Writing Other Verbal Expression Comments: pt demonstrates expressive aphasia and apraxia Written Expression Dominant Hand: Right Written Expression: Exceptions to [facility] Self Formulation Ability: Word Interfering Components: Other (comment) (language impairment) Effective Techniques: Verbal/Language cues  Therapy/Group: Individual Therapy  Alice Reichert Radene Journey 05/01/2011 4:41 PM

## 2011-05-01 NOTE — Progress Notes (Signed)
Physical Therapy Note  Patient Details  Name: [name] MRN: [id number] Date of Birth: 08-23-1950 Today's Date: 05/01/2011  1600-1655 (55 minutes) group treatment Pain: no complaint of pain Treatment: Pt participated in gait/functional mobility group: Focus of treatment to improve dynamic standing balance and gait while performing dynamic activities; single leg stance tossing ball toward rebounder with decreased sing leg stance time Lt LE; gait forward/backward tossing basketball close supervision. Pt. Ambulates > 500 feet SBA  Jaevian Shean,JIM 05/01/2011, 4:58 PM

## 2011-05-01 NOTE — Progress Notes (Signed)
Physical Therapy Note  Patient Details  Name: [name] MRN: [id number] Date of Birth: 1951-04-12 Today's Date: 05/01/2011  0930-1025 (55 minutes) individual therapy session Vitals - heart rate= 62 BPM (resting); Oxygen Sats 92 % RA (resting) Pain- unrated pain Rt knee (gout)- premedicated  Focus of treatment: Therapeutic activities to improve dynamic standing balance Treatment: Balance board forward/sideways in parallel bars for safety (min assist); Balance beam forward, sideways min assist with one loss of balance Gait: Treadmill (level) at 0.7 MPH for 2 minutes using bilateral UE assist - pt had difficulty maintaining speed with bilateral foot flat at heel strike; pt could not ambulate without using safety handrails; Treadmill trial # 2- as previous X 3 minutes with tactile cues to decrease forward lean on rails- no reported problems post activity (Oxygen Sats 95 % RA, pulse 74.)  Veva Grimley,JIM 05/01/2011, 9:37 AM

## 2011-05-01 NOTE — Progress Notes (Signed)
Patient alert and oriented x 3 - uses call bell appropriately. Patient has expressive aphasia. Patient has right sided weakness- some neglect. Patient able to ambulate with supervision. Patient continent bowel and bladder with last bowel movement on 12/15. Patient has thrush to tongue - using nystatin. Patient denies any pain. Meds whole in pudding. Continue with plan of care. May need bed alarm at night for safety.

## 2011-05-02 LAB — GLUCOSE, CAPILLARY
Glucose-Capillary: 87 mg/dL (ref 70–99)
Glucose-Capillary: 125 mg/dL — ABNORMAL HIGH (ref 70–99)

## 2011-05-02 NOTE — Progress Notes (Signed)
Patient ID: [name], male   DOB: 05/08/1951, 60 y.o.   MRN: [id number] Subjective/Complaints: Severe exp aphasia , Y/N ~75% accurate  Objective:Review of Systems  HENT: Negative for nosebleeds.   Eyes: Negative for blurred vision.  Respiratory: Negative for cough.   Cardiovascular: Negative for chest pain and palpitations.  Gastrointestinal: Negative for nausea, vomiting and abdominal pain.  Musculoskeletal: Positive for joint pain (left knee pain last night affected sleep.).  Neurological: Negative for speech change and headaches.  Psychiatric/Behavioral: Negative for depression. The patient is not nervous/anxious.   no c/o today  Vital Signs: Blood pressure 131/86, pulse 60, temperature 98.1 F (36.7 C), temperature source Oral, resp. rate 20, height 6\' 2"  (1.88 m), weight 98.204 kg (216 lb 8 oz), SpO2 100.00%. No results found. No results found for this basename: WBC:2,HGB:2,HCT:2,PLT:2 in the last 72 hours No results found for this basename: NA:2,K:2,CL:2,CO2:2,GLUCOSE:2,BUN:2,CREATININE:2,CALCIUM:2 in the last 72 hours CBG (last 3)   Basename 05/02/11 0741 05/01/11 2116 05/01/11 1646  GLUCAP 87 99 92    Wt Readings from Last 3 Encounters:  04/27/11 98.204 kg (216 lb 8 oz)  04/26/11 72.576 kg (160 lb)    Physical Exam:  General appearance: alert and cooperative Head: Normocephalic, without obvious abnormality, atraumatic  Thrush on tongue Eyes: negative.  Conjunctivae injected Throat: normal findings: lips normal without lesions Neck: no adenopathy, no JVD and supple, symmetrical, trachea midline Back: negative, symmetric,  Resp: clear to auscultation bilaterally Cardio: regular rate and rhythm GI: soft, non-tender; bowel sounds normal; no masses,  no organomegaly Extremities: Moderate edema right knee with dry scabs.  Tenderness with ROM and palpation.  2+ edema RLE.  1+ edema LLE Skin: stasis changes BLE.  Dry scabs right knee.  Old scars right lip and right  chest wall. Neurologic: Dysarthric but better.  Verbal apraxia, expressive aphasia. Very alert.  Right pronator drift.  Decreased FMC RUE,  Strength  4/5 RUE prox greater than distal.    Assessment/Plan: 1. 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 Physiatrist is providing close team supervision and 24 hour management of active medical problems listed below. Physiatrist and rehab team continue to assess barriers to discharge/monitor patient progress toward functional and medical goals. Mobility: Bed Mobility Bed Mobility: No Transfers Transfers: Yes Sit to Stand: 5: Supervision;With upper extremity assist;From chair/3-in-1 Stand to Sit: 5: Supervision;With armrests;To chair/3-in-1;With upper extremity assist Stand Pivot Transfers: 3: Mod assist Squat Pivot Transfers: 5: Supervision;With upper extremity assistance;With armrests Ambulation/Gait Ambulation/Gait Assistance: 5: Supervision Ambulation Distance (Feet): 300 Feet Assistive device: None Gait Pattern: Decreased stance time - right;Decreased step length - left;Step-through pattern;Antalgic Stairs: No Stairs Assistance: 5: Supervision Stair Management Technique: Two rails;Alternating pattern;Step to pattern Number of Stairs: 9  Height of Stairs: 4  Wheelchair Mobility Wheelchair Mobility: No (gait primary means of mobility) Wheelchair Assistance: 4: Min Education officer, museum: Both upper extremities Wheelchair Parts Management: Needs assistance Distance: 25 ADL:   Cognition: Cognition Overall Cognitive Status: Appears within functional limits for tasks assessed Arousal/Alertness: Awake/alert Orientation Level: Oriented X4;Other (Comment) Attention: Selective Selective Attention: Appears intact Memory: Appears intact Awareness: Appears intact Awareness Impairment: Emergent  impairment Problem Solving: Impaired Problem Solving Impairment: Functional complex Safety/Judgment: Impaired Comments: decreased safety awareness Cognition Arousal/Alertness: Awake/alert Orientation Level: Oriented X4;Other (Comment)   Medical Problem List and Plan:   1. DVT Prophylaxis/Anticoagulation: Pharmaceutical: Lovenox   2. Pain Management: complete steroid taper.Will titrate colchicine  to tolerance. Prednisone taper. Right knee pain is in the main factor for gait.   3. Mood: reported to be labile. Currently without distress. Monitor.   4. 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   5. Diabetes mellitus: Monitor with AC/HS cbg checks. CM diet for now. Sugars showing improvement.  6. HTN (hypertension): Monitor with bid checks. On nitro patch, metoprolol.  Lisinopril added. May need further titration.  7. Chronic Anemia? : Check anemia panel   10. H/O DVT (deep venous thrombosis): Add lovenox.TEDs for edema control.   11. Gout: On steroid taper. Colchicine BID has helped.  Sleeping better.  12. Thrush: Added diflucan.        LOS (Days) 5 A FACE TO FACE EVAL WAS PERFORMED    05/02/2011, 7:57 AM

## 2011-05-02 NOTE — Progress Notes (Signed)
Occupational Therapy Session Note  Patient Details  Name: [name] MRN: [id number] Date of Birth: 10-Apr-1951  Today's Date: 05/02/2011 Time: 1400-1500 Time Calculation (min): 60 min  Precautions: Precautions Precautions: Fall (thickened liquids) Required Braces or Orthoses: No Restrictions Weight Bearing Restrictions: No  Short Term Goals: OT Short Term Goal 1: Pt will transfer to toilet with supervision OT Short Term Goal 2: Pt will perform bathing (10/10 parts) with supervision at shower level OT Short Term Goal 3:  Pt will perform toileting in standing with supervision OT Short Term Goal 4: Pt will be I with HEP for Right UE coordination  Skilled Therapeutic Interventions/Progress Updates:    group UE exercises and patient tolerated very well  Therapy/Group: Group Therapy  Rozelle Logan 05/02/2011, 5:41 PM

## 2011-05-02 NOTE — Progress Notes (Signed)
Patient alert and oriented x 3 - uses call bell appropriately. Patient has expressive aphasia. Patient has right sided weakness- some neglect. Patient able to ambulate with supervision. Patient continent bowel and bladder with last bowel movement on 12/16. Patient has thrush to tongue - using nystatin. Patient denies any pain. Meds whole in pudding. Continue with plan of care. May need bed alarm at night for safety.

## 2011-05-03 LAB — GLUCOSE, CAPILLARY
Glucose-Capillary: 112 mg/dL — ABNORMAL HIGH (ref 70–99)
Glucose-Capillary: 94 mg/dL (ref 70–99)

## 2011-05-03 NOTE — Progress Notes (Signed)
Physical Therapy Session Note  Patient Details  Name: [name] MRN: [id number] Date of Birth: 1951-03-29  Today's Date: 05/03/2011 Time: 7829-5621 Time Calculation (min): 61 min  Precautions: Precautions Precautions: Fall Required Braces or Orthoses: No Restrictions Weight Bearing Restrictions: No  General Chart Reviewed: Yes Pain Pain Assessment Pain Assessment: No/denies pain Pain Score: 0-No pain Mobility Bed Mobility Bed Mobility: Yes Supine to Sit: 6: mod I Transfers Transfers: Yes Sit to Stand: 6: mod I Stand to Sit: 6: mod I Locomotion  Ambulation Ambulation: Yes Ambulation/Gait Assistance: 6:mod I Ambulation Distance (Feet):  (>357ft) Assistive device: None Gait Gait: Yes Gait velocity: decreased R step length; decreased L plantarflexion in terminal stance Stairs / Additional Locomotion Stairs: No Wheelchair Mobility Wheelchair Mobility: No (gait primary means of mobility)   Other Treatments   Gait >32ft focusing on tall posture and equal stride length.  High level gait activities to improve dynamic balance and decrease risk of falls:  Step over objects supervision forward/backwards; verbal cues given for technique  Side stepping with basketball passes supervision (chest passes, bounce passes, single arm bounce passes); verbal cues for to keep hips square and toes pointed straight  Passing soccer ball supervision; verbal cues for technique, keep a wider base of support and not to cross the midline with the LE's.  Tall kneeling and half-kneeling on pad tossing the 1000g med ball supervision; verbal cues for activation of gluteals; activity discontinued due to pain in R knee from gout.  Dynamic gait on treadmill min A at 1.48mph looking up/down/left/right; decreased stride length RLE and decreased plantarflexion at L terminal stance; verbal cues for increased step length with RLE and tall posture by activating gluteals.  Balance activity on  airex pad tossing the 3000g med ball:  Bilateral and unilateral UE tosses supervision  Alternating single leg stance tosses min A; verbal cues to activate gluteals Pt extremely high level with balance and gait activities.  Pt able to self correct any LOB  Therapy/Group: Individual Therapy  Christianne Dolin 05/03/2011, 1:00 PM

## 2011-05-03 NOTE — Progress Notes (Signed)
Occupational Therapy Note  Patient Details  Name: [name] MRN: [id number] Date of Birth: 23-Nov-1950 Today's Date: 05/03/2011  Time: 815-900 ( ) No c/o pain  1:1 self care retraining at shower level in room- standing, at distant Supervision/ within hearing distance for any needs, focus on standing balance, one limb stance to doff/ don underwear and pants, d/c planning for this week, naming objects with articulatory placement cue- only needing a cue 2/12 times, communication  Made mod I in the room.  Melonie Florida 05/03/2011, 8:52 AM

## 2011-05-03 NOTE — Progress Notes (Signed)
Patient ID: [name], male   DOB: 22-Jun-1950, 60 y.o.   MRN: [id number] Patient ID: [name], male   DOB: 1950-10-01, 60 y.o.   MRN: [id number] Subjective/Complaint  Objective:Review of Systems  HENT: Negative for nosebleeds.   Eyes: Negative for blurred vision.  Respiratory: Negative for cough.   Cardiovascular: Negative for chest pain and palpitations.  Gastrointestinal: Negative for nausea, vomiting and abdominal pain.  Musculoskeletal: Positive for joint pain (left knee pain last night affected sleep.).  Neurological: Negative for speech change and headaches.  Psychiatric/Behavioral: Negative for depression. The patient is not nervous/anxious.   no c/o today. Quiet weekend  Vital Signs: Blood pressure 128/70, pulse 64, temperature 97.5 F (36.4 C), temperature source Oral, resp. rate 17, height 6\' 2"  (1.88 m), weight 98.204 kg (216 lb 8 oz), SpO2 96.00%. No results found. No results found for this basename: WBC:2,HGB:2,HCT:2,PLT:2 in the last 72 hours No results found for this basename: NA:2,K:2,CL:2,CO2:2,GLUCOSE:2,BUN:2,CREATININE:2,CALCIUM:2 in the last 72 hours CBG (last 3)   Basename 05/02/11 2030 05/02/11 1648 05/02/11 1124  GLUCAP 117* 125* 90    Wt Readings from Last 3 Encounters:  04/27/11 98.204 kg (216 lb 8 oz)  04/26/11 72.576 kg (160 lb)    Physical Exam:  General appearance: alert and cooperative Head: Normocephalic, without obvious abnormality, atraumatic  Thrush on tongue Eyes: negative.  Conjunctivae injected Throat: normal findings: lips normal without lesions Neck: no adenopathy, no JVD and supple, symmetrical, trachea midline Back: negative, symmetric,  Resp: clear to auscultation bilaterally Cardio: regular rate and rhythm GI: soft, non-tender; bowel sounds normal; no masses,  no organomegaly Extremities: Moderate edema right knee with dry scabs.  Tenderness with ROM and palpation.  2+ edema RLE.  1+ edema LLE Skin: stasis changes BLE.   Dry scabs right knee.  Old scars right lip and right chest wall. Neurologic: Dysarthria better  Verbal apraxia, expressive aphasia. Very alert.  Right pronator drift.  Decreased FMC RUE,  Strength  4/5 RUE prox greater than distal.    Assessment/Plan: 1. 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 Physiatrist is providing close team supervision and 24 hour management of active medical problems listed below. Physiatrist and rehab team continue to assess barriers to discharge/monitor patient progress toward functional and medical goals. Mobility: Bed Mobility Bed Mobility: No Transfers Transfers: Yes Sit to Stand: 5: Supervision;With upper extremity assist;From chair/3-in-1 Stand to Sit: 5: Supervision;With armrests;To chair/3-in-1;With upper extremity assist Stand Pivot Transfers: 3: Mod assist Squat Pivot Transfers: 5: Supervision;With upper extremity assistance;With armrests Ambulation/Gait Ambulation/Gait Assistance: 5: Supervision Ambulation Distance (Feet): 300 Feet Assistive device: None Gait Pattern: Decreased stance time - right;Decreased step length - left;Step-through pattern;Antalgic Stairs: No Stairs Assistance: 5: Supervision Stair Management Technique: Two rails;Alternating pattern;Step to pattern Number of Stairs: 9  Height of Stairs: 4  Wheelchair Mobility Wheelchair Mobility: No (gait primary means of mobility) Wheelchair Assistance: 4: Min Education officer, museum: Both upper extremities Wheelchair Parts Management: Needs assistance Distance: 25 ADL:   Cognition: Cognition Overall Cognitive Status: Appears within functional limits for tasks assessed Arousal/Alertness: Awake/alert Orientation Level: Oriented X4 Attention: Selective Selective Attention: Appears intact Memory: Appears intact Awareness: Appears intact Awareness  Impairment: Emergent impairment Problem Solving: Impaired Problem Solving Impairment: Functional complex Safety/Judgment: Impaired Comments: decreased safety awareness Cognition Arousal/Alertness: Awake/alert Orientation Level: Oriented X4   Medical Problem List and Plan:   1. DVT Prophylaxis/Anticoagulation: Pharmaceutical: Lovenox   2.  Pain Management: complete steroid taper.Will titrate colchicine to tolerance. Prednisone taper. Right knee pain is in the main factor for gait.   3. Mood: reported to be labile. Currently without distress. Monitor.   4. 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   5. Diabetes mellitus: Monitor with AC/HS cbg checks. CM diet. Sugars improved.  6. HTN (hypertension): Monitor with bid checks. On nitro patch, metoprolol.  Lisinopril added and BP has responded.  7. Chronic Anemia? : Check anemia panel   10. H/O DVT (deep venous thrombosis) lovenox.TEDs for edema control.   11. Gout: On steroid taper. Colchicine BID--consider change to qd.  Sleeping better.  12. Thrush: Added diflucan.        LOS (Days) 6 A FACE TO FACE EVAL WAS PERFORMED    05/03/2011, 6:54 AM

## 2011-05-03 NOTE — Progress Notes (Signed)
Patient tolerating therapy. No complaints of pain . Ambulating in room independently . Gait steady. Right side remains weaker than left . Expressive aphasia persists patient uses hand gestures and nods to communicate most of the time. Patient very compliant with chin tuck and drinking nectar thick liquids . Staff continues to provide supervision with meals. Continue with plan of care.                                                                                        [name]

## 2011-05-03 NOTE — Progress Notes (Signed)
Speech Language Pathology Therapy Note  Patient Details  Name: [name] MRN: [id number] Date of Birth: 1950-10-13  Today's Date: 05/03/2011 Time: 1330-1430 Time Calculation (min): 60 min  Precautions: Precautions Precautions: Fall Required Braces or Orthoses: No Restrictions Weight Bearing Restrictions: No  Skilled Therapeutic Interventions: Treatment focus on functional communication. Pt Mod I for utilizing communication board to increase overall functional communication at home and out in the community. Pt with increased functional naming and independently named functional items with supervision articulation placement cue with 90% accuracy. Pt also increased length of utterance to two words by describing function (ex. Drink it).   Precautions/Restrictions  Precautions Precautions: Fall Required Braces or Orthoses: No Restrictions Weight Bearing Restrictions: No  Pain Pain Assessment Pain Assessment: No/denies pain  Oral/Motor: Oral Motor/Sensory Function Labial ROM: Reduced right Labial Symmetry: Abnormal symmetry right Labial Strength: Reduced Labial Sensation: Reduced Lingual ROM: Reduced right Lingual Symmetry: Abnormal symmetry right Lingual Strength: Reduced Lingual Sensation: Reduced Facial ROM: Within Functional Limits Facial Symmetry: Right droop Facial Strength: Within Functional Limits Facial Sensation: Within Functional Limits Velum: Within Functional Limits Mandible: Within Functional Limits Motor Speech Intelligibility: Intelligible Comprehension: Auditory Comprehension Yes/No Questions: Within Functional Limits Commands: Impaired One Step Basic Commands: 75-100% accurate Two Step Basic Commands: 75-100% accurate Multistep Basic Commands: 50-74% accurate Conversation: Simple Interfering Components: Motor planning;Processing speed EffectiveTechniques: Extra processing time Visual Recognition/Discrimination Discrimination: Within Function  Limits Reading Comprehension Reading Status: Impaired Word level: Within functional limits Sentence Level: Within functional limits Paragraph Level: Impaired Functional Environmental (signs, name badge): Within functional limits Interfering Components:  (language impairment) Expression: Expression Primary Mode of Expression: Nonverbal - gestures Verbal Expression Initiation: Impaired Automatic Speech: Name;Counting;Day of week Level of Generative/Spontaneous Verbalization: Word Repetition: Impaired Level of Impairment: Word level (syllable level) Naming: Impairment Responsive: 76-100% accurate Confrontation: Impaired Convergent: Not tested Divergent: Not tested Verbal Errors: Aware of errors Pragmatics: No impairment Interfering Components:  (motor planning) Effective Techniques: Sentence completion;Semantic cues;Phonemic cues;Written cues Non-Verbal Means of Communication: Gestures;Writing Other Verbal Expression Comments: pt demonstrates expressive aphasia and apraxia Written Expression Dominant Hand: Right Written Expression: Exceptions to [facility] Self Formulation Ability: Word Interfering Components: Other (comment) (language impairment) Effective Techniques: Verbal/Language cues  Therapy/Group: Individual Therapy  Jaala Bohle 05/03/2011 3:30 PM

## 2011-05-03 NOTE — Progress Notes (Signed)
Speech Pathology: Dysphagia Treatment Note [place] [facility] Note 12:00-12:30  Patient was observed with : Dys. 3 textures and Nectar thick liquids.  Patient was noted to have s/s of aspiration : Yes:  Pt with intermittent cough but pt reports increased coughing which he suspects is due to a cold/drainage. Pt currently taking cough syrup.    Patient required: supervision cues to consistently follow precautions/strategies  Clinical Impression: Pt with intermittent cough, cannot r/o penetration/aspiration but suspect can be due to cold as well.  Recommendations:   Pt will have MBSS tomorrow to assess for possible diet upgrade.   Pain:   none Intervention Required:   No

## 2011-05-03 NOTE — Progress Notes (Signed)
Social Work   Both pt and sister aware that therapy team feels patient may be ready for d/c on Wed. 12/19.  Have left note for MD.  Team recommending outpatient ST f/u only.  As pt is to d/c to sister's home in [place], will arrange this at [facility].  Sohrab Keelan

## 2011-05-03 NOTE — Progress Notes (Signed)
OccupationalTherapy Note  Patient Details  Name: [name] MRN: [id number] Date of Birth: [birth year] Today's Date: 05/03/2011  Time: 1130 - 1200 30 min group  Pain - none  Skilled intervention: Pt seen in [employer] with focus on safe swallowing techniques and functional use R hand and integrated use of BUE. Min cues for RUE use. Coughing at times. Good participation.  Group session.  [name],[name] 05/03/2011, 1:55 PM

## 2011-05-04 ENCOUNTER — Inpatient Hospital Stay (HOSPITAL_COMMUNITY): Payer: Medicaid Other

## 2011-05-04 DIAGNOSIS — I6330 Cerebral infarction due to thrombosis of unspecified cerebral artery: Secondary | ICD-10-CM

## 2011-05-04 DIAGNOSIS — Z5189 Encounter for other specified aftercare: Secondary | ICD-10-CM

## 2011-05-04 LAB — GLUCOSE, CAPILLARY
Glucose-Capillary: 101 mg/dL — ABNORMAL HIGH (ref 70–99)
Glucose-Capillary: 96 mg/dL (ref 70–99)
Glucose-Capillary: 93 mg/dL (ref 70–99)

## 2011-05-04 MED ORDER — FERROUS SULFATE 325 (65 FE) MG PO TABS
325.0000 mg | ORAL_TABLET | Freq: Two times a day (BID) | ORAL | Status: DC
  Administered 2011-05-04 – 2011-05-05 (×3): 325 mg via ORAL
  Filled 2011-05-04 (×5): qty 1

## 2011-05-04 NOTE — Progress Notes (Signed)
Physical Therapy Session Note  Patient Details  Name: [name] MRN: [id number] Date of Birth: Mar 02, 1951  Today's Date: 05/04/2011 Time: 1000-1059 Time Calculation (min): 59 min  Precautions: Precautions Precautions: Fall Required Braces or Orthoses: No Restrictions Weight Bearing Restrictions: No  General Chart Reviewed: Yes Pain Pain Assessment Pain Assessment: No/denies pain Mobility Bed Mobility Bed Mobility: No Transfers Transfers: Yes Stand Pivot Transfers: 7: Independent Locomotion  Ambulation Ambulation: Yes Ambulation/Gait Assistance: 6: Modified independent (Device/Increase time) Ambulation Distance (Feet):  (>368ft) Assistive device: None Gait Gait: Yes Gait Pattern: Within Functional Limits High Level Ambulation High Level Ambulation: Backwards walking;Direction changes;Head turns Stairs / Additional Locomotion Stairs: Yes Stairs Assistance: 6: Modified independent (Device/Increase time) Stair Management Technique: No rails;Alternating pattern;Step to pattern;Forwards (alternating pattern up; step-to down) Number of Stairs: 12  Height of Stairs: 6  Wheelchair Mobility Wheelchair Mobility: No (gait primary means of mobility)   Other Treatments   Dynamic balance activities supervision to improve dynamic balance and decrease the risk of falls:  Tossing 3000g med ball on Airex foam bilat UE's and alternating single UE  Trunk rotation holding 3000g med ball to both sides on Airex foam x 5  Shooting baskets on Airex foam standing wide base of support and alternating single leg stance  High level dynamic gait activities supervision to improve dynamic balance and obstacle negotiation in order to decrease the risk of falls:  Gait with bouncing beach ball on tennis racket; verbal cues for technique  Gait with bouncing tennis ball on tennis racket; verbal cues for technique  Co-treat outside with recreational therapist doing dynamic gait  activities supervision to improve dynamic balance and obstacle negotiation in order to decrease the risk of falls:  Gait with head turns left/right  Gait backwards  Gait over grass/uneven surfaces  Gait tossing/catching beach ball  Gait side-stepping while passing beach ball back and forth with PT Pt very high level currently; pt able to self-correct loss of balance with good ankle and hip strategies.  Therapy/Group: Individual Therapy  Christianne Dolin 05/04/2011, 11:49 AM

## 2011-05-04 NOTE — Progress Notes (Signed)
Patient ID: Nivek Powley, male   DOB: 08-24-1950, 60 y.o.   MRN: [id number] Patient ID: Garreth Burnsworth, male   DOB: [birth year], 60 y.o.   MRN: [id number] Patient ID: Japheth Diekman, male   DOB: 1950/10/14, 60 y.o.   MRN: [id number] Subjective/Complaint  Objective:Review of Systems  HENT: Negative for nosebleeds.   Eyes: Negative for blurred vision.  Respiratory: Negative for cough.   Cardiovascular: Negative for chest pain and palpitations.  Gastrointestinal: Negative for nausea, vomiting and abdominal pain.  Musculoskeletal: Positive for joint pain (left knee pain last night affected sleep.).  Neurological: Negative for speech change and headaches.  Psychiatric/Behavioral: Negative for depression. The patient is not nervous/anxious.   no c/o today  Vital Signs: Blood pressure 130/88, pulse 60, temperature 98.8 F (37.1 C), temperature source Oral, resp. rate 19, height 6\' 2"  (1.88 m), weight 98.204 kg (216 lb 8 oz), SpO2 98.00%. No results found. No results found for this basename: WBC:2,HGB:2,HCT:2,PLT:2 in the last 72 hours No results found for this basename: NA:2,K:2,CL:2,CO2:2,GLUCOSE:2,BUN:2,CREATININE:2,CALCIUM:2 in the last 72 hours CBG (last 3)   Basename 05/04/11 0710 05/03/11 2002 05/03/11 1624  GLUCAP 101* 117* 94    Wt Readings from Last 3 Encounters:  04/27/11 98.204 kg (216 lb 8 oz)  04/26/11 72.576 kg (160 lb)    Physical Exam:  General appearance: alert and cooperative Head: Normocephalic, without obvious abnormality, atraumatic  Thrush on tongue Eyes: negative.  Conjunctivae injected Throat: normal findings: lips normal without lesions Neck: no adenopathy, no JVD and supple, symmetrical, trachea midline Back: negative, symmetric,  Resp: clear to auscultation bilaterally Cardio: regular rate and rhythm GI: soft, non-tender; bowel sounds normal; no masses,  no organomegaly Extremities: Moderate edema right knee with dry scabs.  Tenderness with ROM and  palpation.  2+ edema RLE.  1+ edema LLE Skin: stasis changes BLE.  Dry scabs right knee.  Old scars right lip and right chest wall. Neurologic: Dysarthria better  Verbal apraxia, expressive aphasia. Very alert.  Right pronator drift.  Decreased FMC RUE,  Strength  4/5 RUE prox greater than distal.    Assessment/Plan: 1. 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 Physiatrist is providing close team supervision and 24 hour management of active medical problems listed below. Physiatrist and rehab team continue to assess barriers to discharge/monitor patient progress toward functional and medical goals. Mobility: Bed Mobility Bed Mobility: Yes Supine to Sit: 5: Supervision Transfers Transfers: Yes Sit to Stand: 5: Supervision Stand to Sit: 5: Supervision Stand Pivot Transfers: 3: Mod assist Squat Pivot Transfers: 5: Supervision;With upper extremity assistance;With armrests Ambulation/Gait Ambulation/Gait Assistance: 6: Modified independent (Device/Increase time) Ambulation Distance (Feet):  (>363ft) Assistive device: None Gait Pattern: Decreased stance time - right;Decreased step length - left;Step-through pattern;Antalgic Gait velocity: decreased R step length; decreased L plantarflexion in terminal stance Stairs: No Stairs Assistance: 5: Supervision Stair Management Technique: Two rails;Alternating pattern;Step to pattern Number of Stairs: 9  Height of Stairs: 4  Wheelchair Mobility Wheelchair Mobility: No (gait primary means of mobility) Wheelchair Assistance: 4: Min Education officer, museum: Both upper extremities Wheelchair Parts Management: Needs assistance Distance: 25 ADL:   Cognition: Cognition Overall Cognitive Status: Appears within functional limits for tasks assessed Arousal/Alertness: Awake/alert Orientation Level: Oriented  X4 Attention: Selective Selective Attention: Appears intact Memory: Appears intact Awareness: Appears intact Awareness Impairment: Emergent impairment Problem Solving: Impaired Problem Solving Impairment: Functional complex Safety/Judgment: Impaired Comments: decreased safety awareness  Cognition Arousal/Alertness: Awake/alert Orientation Level: Oriented X4   Medical Problem List and Plan:   1. DVT Prophylaxis/Anticoagulation: Pharmaceutical: Lovenox   2. Pain Management: complete steroid taper.Will titrate colchicine to tolerance. Prednisone taper. Right knee pain is in the main factor for gait.   3. Mood: reported to be labile. Currently without distress. Monitor.   4. 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   5. Diabetes mellitus:  AC/HS cbg checks. CM diet. Sugars under tight control.  6. HTN (hypertension): Monitor with bid checks. On nitro patch, metoprolol.  Lisinopril added and BP has responded.  7. Chronic Anemia? : Fe supplement. Recheck tomorrow.  10. H/O DVT (deep venous thrombosis) lovenox.TEDs for edema control.   11. Gout: On steroid taper. Colchicine BID--consider change to qd.  Sleeping better.  12. Thrush: Added diflucan.        LOS (Days) 7 A FACE TO FACE EVAL WAS PERFORMED    05/04/2011, 8:09 AM

## 2011-05-04 NOTE — Progress Notes (Signed)
Speech Language Pathology Therapy Note  Patient Details  Name: [name] MRN: [id number] Date of Birth: [birth year]  Today's Date: 05/04/2011 Time: 4098-1191 Time Calculation (min): 30 min  Precautions: Precautions Precautions: Fall Required Braces or Orthoses: No Restrictions Weight Bearing Restrictions: No  Modified Barium Swallow Procedure Note Patient Details  Name: [name] MRN: [id number] Date of Birth: [birth year]  Today's Date: 05/04/2011 Time: 3086-5784 Time Calculation (min): 30 min  Past Medical History:  Past Medical History  Diagnosis Date  . History of deep vein thrombosis 2006  . Hypertension   . Diabetes mellitus   . Gout current and history of  . History of tobacco abuse     has smoked for 50 years  . Cocaine abuse     + UDS on admission   Past Surgical History: No past surgical history on file.  HPI: Pt admitted to [facility] 04/21/11.MRI brain with acute left parietal and temporal cortical ischemia. MBS on 04/27/11 and recommended Dys. 3 textures with nectar-thick liquids with multiple compensatory strategies. Pt transferred to CIR on 04/27/11 and has been participating in dysphagia therapy and consuming current diet without overt s/s of aspiration. MBSS today to assess swallow function and for possible diet upgrade.  Clinical Impression Statement: Pt demonstrates a mild sensory-motor based oral-pharyngeal dysphagia. Oral-phayrngeal dysphagia characterized by anterior spillage of bolus due to labial weakness and premature spillage of both nectar thick and thin liquids to the pyriform sinuses resulting in silent penetration to the cords. Cued cough/throat clear expelled penetrates out of laryngeal vestibule. Pt recommended regular textures with thin liquids with use of compensatory strategies. Aspiration risk is minimal with current recommendations.    Recommendation/Prognosis  Clinical Impression Dysphagia Diagnosis: Mild oral phase  dysphagia;Mild pharyngeal phase dysphagia Recommendations Solid Consistency: Regular Liquid Consistency: Thin Liquid Administration via: Cup;Straw Medication Administration: Whole meds with liquid Supervision: Intermittent supervision to cue for compensatory strategies Supervision: Patient able to self feed Compensations: Small sips/bites (cough/throat clear after sips of liquids) Postural Changes and/or Swallow Maneuvers: Seated upright 90 degrees Oral Care Recommendations: Patient independent with oral care Follow up Recommendations: Outpatient SLP Prognosis Prognosis for Safe Diet Advancement: Good Individuals Consulted Consulted and Agree with Results and Recommendations: Patient  General:  Date of Onset: 04/21/11 HPI (Other Pertinent Information): Pt admitted to [facility] on 04/21/11. 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 MBS on 04/27/11 and recommended Dys, 3 textures with nectar-thick liquids with multiple compensatory strategies. Transferred to CIR on 04/27/11 and has been participating in dysphagia therapy without overt s/s of aspiration with current diet. MBSS today to assess swallowing function and possible diet upgrade.  Type of Study: Repeat MBS Diet Prior to this Study: Nectar-thick liquids;Dysphagia 3 (soft) Temperature Spikes Noted: No Respiratory Status: Room air History of Intubation: No Behavior/Cognition: Alert;Cooperative;Pleasant mood Oral Cavity - Dentition: Missing dentition Oral Motor / Sensory Function: Impaired - see Bedside swallow eval;Impaired motor Vision: Functional for self-feeding Patient Positioning: Postural control adequate for testing Baseline Vocal Quality: Normal Volitional Cough: Strong Volitional Swallow: Able to elicit Anatomy:  (osteophytes C3-C4, C4-C5, C5-C6) Pharyngeal Secretions: Normal Ice chips: Not tested  Reason for Referral:  To determine safety for possible diet upgrade.  Oral Phase Oral  Preparation/Oral Phase Oral Phase: Impaired Oral - Nectar Oral - Nectar Cup: Right anterior bolus loss Oral - Thin Oral - Thin Cup: Right anterior bolus loss Pharyngeal Phase  Pharyngeal Phase Pharyngeal Phase: Impaired Pharyngeal - Nectar Pharyngeal - Nectar Cup: Premature spillage to valleculae;Compensatory strategies attempted (  Comment) (trace penetration with large sips, cleared with cued cough) Pharyngeal - Thin Pharyngeal - Thin Cup: Premature spillage to pyriform;Compensatory strategies attempted (Comment);Penetration/Aspiration during swallow (trace penetration with small and sequential sips) Penetration/Aspiration details (thin cup): Material enters airway, CONTACTS cords and not ejected out (penetrates cleared with cued throat clear/cough) Pharyngeal - Thin Straw: Premature spillage to pyriform;Compensatory strategies attempted (Comment);Penetration/Aspiration during swallow (trace penetration that cleared with cued cough/throat clear) Penetration/Aspiration details (thin straw): Material enters airway, CONTACTS cords and not ejected out Cervical Esophageal Phase  Cervical Esophageal Phase Cervical Esophageal Phase: WFL     [name] 05/04/2011, 10:27 AM    Therapy/Group: MBSS/FEES  [name] 05/04/2011 10:27 AM

## 2011-05-04 NOTE — Progress Notes (Signed)
Speech Pathology: Dysphagia Treatment Note  Group Session 1200-1210   Patient was observed with : Regular textures and Thin liquids.  Patient was noted to have s/s of aspiration : Yes:  Cough x1  Lung Sounds:  WNL Temperature: WNL  Patient required: minimal sematic cues to consistently follow precautions/strategies  Clinical Impression: SLP facilitated with minimal assist semantic cues for small bites throughout meal; large bite resulted in coughing episode x1, suspect penetration; however, patient with strong cough that appeared to clear airway. Patient with supervision semantic cues to utilize throat clears after sips.    Recommendations:  Continue with current plan of care  Pain:   none Intervention Required:   No  Goals: Progressing  Fae Pippin, M.A., CCC-SLP [phone]

## 2011-05-04 NOTE — Progress Notes (Signed)
Occupational Therapy Note  Patient Details  Name: [name] MRN: [id number] Date of Birth: November 03, 1950 Today's Date: 05/04/2011  Time: 1130 - 1200 30 min  Skilled Intervention: Pt seen in [employer] with focus on following safe swallowing strategies, functional use R hand and bilateral integrated use of hands. Pt required min vc to use R hand. Coughing noted but pt stated that he did not feel that he was choking. Good participation. Increased socialization.   Group session. Amour Cutrone,[name] 05/04/2011, 4:31 PM

## 2011-05-04 NOTE — Progress Notes (Signed)
Speech Language Pathology Therapy Note  Patient Details  Name: [name] MRN: [id number] Date of Birth: 1950/07/20  Today's Date: 05/04/2011 Time: 1130-1200 Time Calculation (min): 30 min  Precautions: Precautions Precautions: Fall Required Braces or Orthoses: No Restrictions Weight Bearing Restrictions: No  Skilled Therapeutic Interventions: Treatment focus on family education in regards to current swallowing and cognitive function and functional communication.  Pt's sister educated on effective strategies/cues to utilize to increase overall functional communication. Pt verbalized understanding and demonstrated effective use. Pt also given communication board to use while in community if needed. Pt able to name 10 items during a convergent naming task with supervision articulatory placement cue. Pt will d/c home tomorrow with 24/7 supervision from family.   Vital Signs Therapy Vitals Temp: 98.7 F (37.1 C) Temp src: Oral Pulse Rate: 66  Resp: 22  BP: 117/74 mmHg Patient Position, if appropriate: Lying Oxygen Therapy SpO2: 98 % O2 Device: None (Room air) Pain Pain Assessment Pain Assessment: No/denies pain  Oral/Motor: Oral Motor/Sensory Function Labial ROM: Reduced right Labial Symmetry: Abnormal symmetry right Labial Strength: Reduced Labial Sensation: Reduced Lingual ROM: Reduced right Lingual Symmetry: Abnormal symmetry right Lingual Strength: Reduced Lingual Sensation: Reduced Facial ROM: Within Functional Limits Facial Symmetry: Right droop Facial Strength: Within Functional Limits Facial Sensation: Within Functional Limits Velum: Within Functional Limits Mandible: Within Functional Limits Motor Speech Intelligibility: Intelligible Comprehension: Auditory Comprehension Yes/No Questions: Within Functional Limits Commands: Impaired One Step Basic Commands: 75-100% accurate Two Step Basic Commands: 75-100% accurate Multistep Basic Commands: 50-74%  accurate Conversation: Simple Interfering Components: Motor planning;Processing speed EffectiveTechniques: Extra processing time;Visual/Gestural cues Visual Recognition/Discrimination Discrimination: Within Function Limits Reading Comprehension Reading Status: Impaired Word level: Within functional limits Sentence Level: Within functional limits Paragraph Level: Impaired Functional Environmental (signs, name badge): Within functional limits Interfering Components: Other (comment) (language impairments) Expression: Expression Primary Mode of Expression: Nonverbal - gestures Verbal Expression Overall Verbal Expression: Impaired Initiation: No impairment Automatic Speech: Name;Counting;Day of week Level of Generative/Spontaneous Verbalization: Word Repetition: Impaired Level of Impairment: Word level Naming: Impairment (impacted by motor planning) Responsive: 76-100% accurate Confrontation: Impaired Convergent: Not tested Divergent: Not tested Verbal Errors: Aware of errors Pragmatics: No impairment Interfering Components: Other (comment) (motor planning) Effective Techniques: Articulatory cues;Phonemic cues;Semantic cues;Written cues Non-Verbal Means of Communication: Gestures;Writing;Communication board Other Verbal Expression Comments: pt demonstrates expressive aphasia and apraxia Written Expression Dominant Hand: Right Written Expression: Exceptions to [facility] Self Formulation Ability: Word Interfering Components: Other (comment) (language impairment) Effective Techniques: Verbal/Language cues  Therapy/Group: Individual Therapy  Keldan Eplin 05/04/2011 4:39 PM   Speech Language Pathology Discharge Summary   Long term goals set: 7  Long term goals met: 7  Comments on progress toward goals: Pt has made great progress and has met all LTG's this admission. Pt had MBSS today and recommended a regular diet with thin liquids with use of compensatory strategies. Pt is  overall Mod I for utilization of strategies (small bites/sips, clear throat/cough after sips of liquid) during a meal. Pt has made progress in functional communication and can make needs known by utilization of gestures and written words. Pt has increased verbalization at the word level and is overall Mod A with articulatory placement cues and verbal cues. Pt is also overall supervision for comprehension of basic information. Pt/family education completed and pt will d/c home with 24/7 supervision from family. Skilled f/u outpatient SLP services recommended to maximize functional communication and swallow function.   Reasons goals not met: N/A  Equipment acquired: N/A  Reasons  for discharge: treatment goals met and discharge from hospital  Follow-up: Outpatient  Patient/family agrees with progress made and goals achieved: Yes  Michaeljohn Biss 05/04/2011

## 2011-05-04 NOTE — Progress Notes (Signed)
Occupational Therapy Discharge Summary  Patient Details  Name: [name] MRN: [id number] Date of Birth: 15-Mar-1951 Today's Date: 05/04/2011  Patient has met 9 of 9 long term goals due to improved activity tolerance, improved balance, postural control, functional use of  RIGHT upper and RIGHT lower extremity, improved attention, improved awareness and improved coordination.  Pt is mod I for all basic ADL (showering standing at shower level, transfers in and out of tub, dressing, self feeding with right hand). Pt still requires extra time to complete tasks using right hand as his dominant with increased visual attention to right during all tasks, pt is independent with this.  Recommendation:  No further OT needed at this time.  Equipment: none  Patient/family agrees with progress made and goals achieved: Yes  Occupational Therapy Session Note  Patient Details  Name: [name] MRN: [id number] Date of Birth: 1951-02-26  Today's Date: 05/04/2011 Time:815-900 ( )    Precautions: Precautions Precautions: Fall Required Braces or Orthoses: No Restrictions Weight Bearing Restrictions: No  Short Term Goals: OT Short Term Goal 1: Pt will transfer to toilet with supervision   MET OT Short Term Goal 2: Pt will perform bathing (10/10 parts) with supervision at shower level  MET OT Short Term Goal 3:  Pt will perform toileting in standing with supervision  MET OT Short Term Goal 4: Pt will be I with HEP for Right UE coordination  MET  Skilled Therapeutic Interventions/Progress Updates: GRAD DAY!! Self care retraining at shower level in ADL apartment using tub shower without any DME.  Performed all basic ADL tasks at mod I level.  Discussed d/c planning, sit to stand, standing dynamic balance , [facility] with familiar tasks. Self feeding mod I using right hand as dominant.     Vital Signs Therapy Vitals Temp: 98.8 F (37.1 C) Temp src: Oral Pulse Rate: 60  Resp: 19  BP:  130/88 mmHg Patient Position, if appropriate: Lying Oxygen Therapy SpO2: 98 % O2 Device: None (Room air) Pain Pain Assessment Pain Assessment: No/denies pain ADL ADL Eating: Modified independent Where Assessed-Eating: Chair Where Assessed-Grooming: Standing at sink Upper Body Bathing: Modified independent Where Assessed-Upper Body Bathing: Shower Lower Body Bathing: Modified independent Where Assessed-Lower Body Bathing: Chair Upper Body Dressing: Modified independent (Device) Where Assessed-Upper Body Dressing: Standing at sink Lower Body Dressing: Modified independent Where Assessed-Lower Body Dressing: Standing at sink;Sitting at sink;Chair Toileting: Modified independent Where Assessed-Toileting: Teacher, adult education: Engineer, agricultural Method: Ambulating Tub/Shower Transfer: Modified independent Tub/Shower Transfer Method: Ambulating   Therapy/Group: Individual Therapy  Melonie Florida 05/04/2011, 8:54 AM

## 2011-05-04 NOTE — Progress Notes (Signed)
Therapeutic Recreation Discharge Summary Patient Details  Name: [name] MRN: [id number] Date of Birth: 20-Mar-1951  Long term goals set: 1  Long term goals met: 1  Comments on progress toward goals: Pt has made great progress toward goal meeting supervision level for community reintegration.  Pt requires supervision due to aphasia, needing  Min cues.  Pt ready for d/c home with family.  Reasons for discharge: discharge from hospital  Patient/family agrees with progress made and goals achieved: Yes  Raymond Bhardwaj 05/04/2011, 12:55 PM

## 2011-05-04 NOTE — Patient Care Conference (Signed)
Inpatient RehabilitationTeam Conference Note Date: 05/04/2011   Time: 7:18 PM    Patient Name: [name]      Medical Record Number: [id number]  Date of Birth: 05-01-1951 Sex: Male         Room/Bed: 4011/4011-01 Payor Info: Payor:     Admitting Diagnosis: LT CVA  Admit Date/Time:  04/27/2011  2:39 PM Admission Comments: No comment available   Primary Diagnosis:  CVA (cerebral infarction) Principal Problem: CVA (cerebral infarction)  Patient Active Problem List  Diagnoses Date Noted  . CVA (cerebral infarction) 04/26/2011  . Diabetes mellitus 04/26/2011  . 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 Anemia 04/26/2011  . DVT (deep venous thrombosis) 04/26/2011    Expected Discharge Date: Expected Discharge Date: 05/05/11  Team Members Present: Physician: Dr. Faith Rogue Case Manager Present: Melanee Spry, RN Social Worker Present: Amada Jupiter, LCSW PT Present: Reggy Eye, PT OT Present: Roney Mans, Loistine Chance, OT;Ardis Rowan, COTA SLP Present: Feliberto Gottron, SLP Other (Discipline and Name): Tora Duck, PPS Coordinator RN Present: Carmie End    Current Status/Progress Goal Weekly Team Focus  Medical   dysarthria with aphasia and apraxia.  improved coordination of right arm and leg  increased tolerance of diet.  increased speech intelligibility  optimization of secondary prevention measure   Bowel/Bladder   continent bowel and bladder  mod independent  continue with plan of care   Swallow/Nutrition/ Hydration   Regular textures with thin liquids  Mod I  Family Edu   ADL's   Mod I  mod I  mod I   Mobility   mod I  mod I  family ed   Communication   Min A with multi-modal communication, Mod A at single word level  Min-Mod A  Family Edu   Safety/Cognition/ Behavioral Observations  Mod I  Mod I  Family Edu   Pain   n/a         Skin   n/a            *See Interdisciplinary Assessment and Plan and progress notes for long and short-term  goals  Barriers to Discharge: none identified at present    Possible Resolutions to Barriers:       Discharge Planning/Teaching Needs:  Home with sister in [place] - "close to" 24/7 available - may have brief periods during the day that he would be alone      Team Discussion: Ready for d/c.  Will need ST f/up-apraxia, dysarthria.  Diet upgraded to regular, thin.  Medical f/up will be at [facility].   Revisions to Treatment Plan: none    Continued Need for Acute Rehabilitation Level of Care: The patient requires daily medical management by a physician with specialized training in physical medicine and rehabilitation for the following conditions: Daily direction of a multidisciplinary physical rehabilitation program to ensure safe treatment while eliciting the highest outcome that is of practical value to the patient.: Yes Daily medical management of patient stability for increased activity during participation in an intensive rehabilitation regime.: Yes Daily analysis of laboratory values and/or radiology reports with any subsequent need for medication adjustment of medical intervention for : Neurological problems;Other  Pt & his sister are agreeable to d/c tomorrow.  Family ed has been done.  Brock Ra 05/04/2011, 7:18 PM

## 2011-05-04 NOTE — Progress Notes (Signed)
Patient Details  Name: Zaiyden Strozier MRN: [id number] Date of Birth: Aug 03, 1950  Today's Date: 05/04/2011 Time:  10:30-11:00   Skilled Therapeutic Interventions/Progress Updates: Out and about on hospital grounds ambulating on even/uneven indoor/outdoor surface, up and down hills, over grass and mulch, up and down flight of stairs with supervision.  Pathfinding using signs to locate unit.  Pt able to use gestures and writing to communicate needs.  No c/o pain.  Therapy/Group: Community Reintegration  Activity Level: Moderate:  Level of assist: Supervision  Philisha Weinel 05/04/2011, 12:53 PM

## 2011-05-05 LAB — CBC
HCT: 32.4 % — ABNORMAL LOW (ref 39.0–52.0)
MCHC: 32.7 g/dL (ref 30.0–36.0)
RDW: 14.3 % (ref 11.5–15.5)

## 2011-05-05 MED ORDER — LISINOPRIL 10 MG PO TABS: 10.0000 mg | ORAL_TABLET | Freq: Every day | ORAL | Status: DC

## 2011-05-05 MED ORDER — METOPROLOL TARTRATE 25 MG PO TABS: 25.0000 mg | ORAL_TABLET | Freq: Two times a day (BID) | ORAL | Status: DC

## 2011-05-05 MED ORDER — SIMVASTATIN 10 MG PO TABS: 10.0000 mg | ORAL_TABLET | Freq: Every day | ORAL | Status: DC

## 2011-05-05 MED ORDER — COLCHICINE 0.6 MG PO TABS: 0.6000 mg | ORAL_TABLET | Freq: Every day | ORAL | Status: DC

## 2011-05-05 MED ORDER — NICOTINE 21 MG/24HR TD PT24: MEDICATED_PATCH | TRANSDERMAL | Status: AC

## 2011-05-05 MED ORDER — FERROUS SULFATE 325 (65 FE) MG PO TABS: 325.0000 mg | ORAL_TABLET | Freq: Two times a day (BID) | ORAL | Status: DC

## 2011-05-05 MED ORDER — ASPIRIN 325 MG PO TBEC: 325.0000 mg | DELAYED_RELEASE_TABLET | Freq: Every day | ORAL | Status: AC

## 2011-05-05 MED ORDER — COLCHICINE 0.6 MG PO TABS
0.6000 mg | ORAL_TABLET | Freq: Every day | ORAL | Status: DC
  Administered 2011-05-05: 0.6 mg via ORAL
  Filled 2011-05-05: qty 1

## 2011-05-05 MED ORDER — NITROGLYCERIN 0.3 MG/HR TD PT24: 1.0000 | MEDICATED_PATCH | Freq: Every day | TRANSDERMAL | Status: DC

## 2011-05-05 NOTE — Progress Notes (Addendum)
Social Work  Discharge Note  The overall goal for the admission was met for:   Discharge location: Yes  - d/c home with sister  Length of Stay: Yes -8 days  Discharge activity level: Yes - independent to supervision  Home/community participation: Yes  Services provided included: MD, RD, PT, OT, SLP, RN, CM, TR, Pharmacy and SW  Financial Services: Other: Medicaid and SSD applications pending  Follow-up services arranged: Outpatient: ST via [facility] and Patient/Family has no preference for HH/DME agencies  Comments (or additional information):  Provided pt and his sister with written and verbal information on the local stroke and aphasia support groups  Patient/Family verbalized understanding of follow-up arrangements: Yes  Individual responsible for coordination of the follow-up plan: patient and sister  Confirmed correct DME delivered:  NA  **Patient referred to [facility] for medical f/u and management.  Clinic and appointment information provided at d/c.

## 2011-05-05 NOTE — Progress Notes (Signed)
Physical Therapy Discharge Summary  Patient Details  Name: Encarnacion Scioneaux MRN: [id number] Date of Birth: 1950-12-11 Today's Date: 05/05/2011  Patient has met 8 of 8 long term goals due to improved gait, balance, mobility and activity tolerance.  Patient to discharge at an ambulatory level Modified Independent.   Recommendation:  Patient requires no PT follow up post CIR discharge.  Equipment: No equipment provided  Patient/family agrees with progress made and goals achieved: Yes  Yuka Lallier 05/05/2011, 12:57 PM

## 2011-05-05 NOTE — Progress Notes (Signed)
Patient ID: [name], male   DOB: 02-Nov-1950, 60 y.o.   MRN: [id number] Patient ID: [name], male   DOB: Feb 01, 1951, 60 y.o.   MRN: [id number] Patient ID: [name], male   DOB: [birth year], 60 y.o.   MRN: [id number] Patient ID: [name], male   DOB: [birth year], 60 y.o.   MRN: [id number] Subjective/Complaint  Objective:Review of Systems  HENT: Negative for nosebleeds.   Eyes: Negative for blurred vision.  Respiratory: Negative for cough.   Cardiovascular: Negative for chest pain and palpitations.  Gastrointestinal: Negative for nausea, vomiting and abdominal pain.  Musculoskeletal: Positive for joint pain (left knee pain last night affected sleep.).       Knee feeling better   Neurological: Negative for speech change and headaches.  Psychiatric/Behavioral: Negative for depression. The patient is not nervous/anxious.   no c/o today  Vital Signs: Blood pressure 125/83, pulse 59, temperature 98.2 F (36.8 C), temperature source Oral, resp. rate 20, height 6\' 2"  (1.88 m), weight 98.204 kg (216 lb 8 oz), SpO2 98.00%. Dg Swallowing Func-no Report  05/04/2011  CLINICAL DATA: dysphagia   FLUOROSCOPY FOR SWALLOWING FUNCTION STUDY:  Fluoroscopy was provided for swallowing function study, which was  administered by a speech pathologist.  Final results and recommendations  from this study are contained within the speech pathology report.     No results found for this basename: WBC:2,HGB:2,HCT:2,PLT:2 in the last 72 hours No results found for this basename: NA:2,K:2,CL:2,CO2:2,GLUCOSE:2,BUN:2,CREATININE:2,CALCIUM:2 in the last 72 hours CBG (last 3)   Basename 05/04/11 2020 05/04/11 1602 05/04/11 1118  GLUCAP 117* 93 96    Wt Readings from Last 3 Encounters:  04/27/11 98.204 kg (216 lb 8 oz)  04/26/11 72.576 kg (160 lb)    Physical Exam:  General appearance: alert and cooperative Head: Normocephalic, without obvious abnormality, atraumatic  Thrush on tongue  better Eyes: negative.  Conjunctivae injected Throat: normal findings: lips normal without lesions Neck: no adenopathy, no JVD and supple, symmetrical, trachea midline Back: negative, symmetric,  Resp: clear to auscultation bilaterally Cardio: regular rate and rhythm GI: soft, non-tender; bowel sounds normal; no masses,  no organomegaly Extremities: decreased knee tenderness with ROM and palpation.  1+ edema RLE.  1+ edema LLE Skin: stasis changes BLE.  Neurologic: Dysarthria better as well as  Verbal apraxia. Very alert.  Right pronator drift.  Decreased FMC RUE,  Strength  4/5 RUE prox greater than distal.    Assessment/Plan: 1. 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 Physiatrist is providing close team supervision and 24 hour management of active medical problems listed below. Physiatrist and rehab team continue to assess barriers to discharge/monitor patient progress toward functional and medical goals.  D/c home today!  Discussed secondary prevention with the patient. See me in about a month  Mobility: Bed Mobility Bed Mobility: No Supine to Sit: 5: Supervision Transfers Transfers: Yes Sit to Stand: 6: Modified independent (Device/Increase time) Stand to Sit: 6: Modified independent (Device/Increase time) Stand Pivot Transfers: 6: Modified independent (Device/Increase time) Squat Pivot Transfers: 5: Supervision;With upper extremity assistance;With armrests Ambulation/Gait Ambulation/Gait Assistance: 6: Modified independent (Device/Increase time) Ambulation Distance (Feet):  (>356ft) Assistive device: None Gait Pattern: Decreased stance time - right;Decreased step length - left;Step-through pattern;Antalgic Gait velocity: decreased R step length; decreased L plantarflexion in terminal stance Stairs: Yes Stairs Assistance: 6: Modified independent  (Device/Increase time) Stair Management Technique: No rails;Alternating pattern;Step to pattern;Forwards (alternating  pattern up; step-to down) Number of Stairs: 12  Height of Stairs: 6  Wheelchair Mobility Wheelchair Mobility: No (gait primary means of mobility) Wheelchair Assistance: 4: Min Education officer, museum: Both upper extremities Wheelchair Parts Management: Needs assistance Distance: 25 ADL:   Cognition: Cognition Overall Cognitive Status: Appears within functional limits for tasks assessed Arousal/Alertness: Awake/alert Orientation Level: Oriented to person;Oriented to place Attention: Selective Selective Attention: Appears intact Memory: Appears intact Awareness: Appears intact Awareness Impairment: Emergent impairment Problem Solving: Appears intact Problem Solving Impairment: Functional basic;Verbal basic Safety/Judgment: Appears intact Comments: decreased safety awareness Cognition Arousal/Alertness: Awake/alert Orientation Level: Oriented to person;Oriented to place   Medical Problem List and Plan:   1. DVT Prophylaxis/Anticoagulation: Pharmaceutical: Lovenox can stop  2. Pain Management: complete steroid taper.Will titrate colchicine to tolerance. Prednisone tapered off.   3. Mood: reported to be labile. Currently without distress. Monitor.   4. 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   5. Diabetes mellitus:  AC/HS cbg checks. CM diet. Sugars under tight control.  6. HTN (hypertension): Monitor with bid checks. On nitro patch, metoprolol.  Lisinopril added and BP has responded.  7. Chronic Anemia? : Fe supplement. Recheck tomorrow.  10. H/O DVT (deep venous thrombosis) lovenox.TEDs for edema control.   11. Gout: colchicine-- change to qd.  Sleeping better.  12. Thrush: Added diflucan. And resolved       LOS  (Days) 8 A FACE TO FACE EVAL WAS PERFORMED    05/05/2011, 6:46 AM

## 2011-05-05 NOTE — Progress Notes (Signed)
Discharge summary [phone]

## 2011-05-06 NOTE — Discharge Summary (Signed)
NAMECHRISTHOPER, [name] NO.:  [id number]  MEDICAL RECORD NO.:  [id number]  LOCATION:  4011                         FACILITY:  [facility]  PHYSICIAN:  Ranelle Oyster, M.D.DATE OF BIRTH:  1951/02/26  DATE OF ADMISSION:  04/27/2011 DATE OF DISCHARGE:  05/05/2011                              DISCHARGE SUMMARY   DISCHARGE DIAGNOSES: 1. Hypertension. 2. Diabetes mellitus type 2, diet controlled. 3. History of polysubstance abuse. 4. Atrial fibrillation.  HISTORY OF PRESENT ILLNESS:  [name] is a 60 year old male with history of DM, DVT in the past, polysubstance abuse, admitted on April 21, 2011, to [facility] with right-sided weakness and right facial droop.  There was a question as to whether the patient had been on chronic Coumadin though the patient denied this.  MRI of brain done showed acute left parietal temporal cortical ischemia.  Carotid Dopplers done showed no ICA stenosis.  Two-D echo done showed EF of 55%, no wall abnormality.  UDS then was positive for cocaine.  The patient was started on aspirin for CVA prophylaxis.  He was noted to have issues with dysphagia with oral motor weakness with drooling and expressive deficits.  He was started on D1 diet honey liquids initially.  Has been advanced to D3 diet, nectar, at the time of discharge.  The patient was also noted to have new onset AFib past admission.  However, due to history of patient's noncompliance with meds, Coumadin was not initiated.  Therapies were initiated and ongoing.  The patient was evaluated by rehab and we felt that he would benefit from an inpatient rehab program.  PAST MEDICAL HISTORY:  Positive for: 1. History of DVT in the past. 2. Hypertension. 3. DM type 2, diet controlled. 4. Gout. 5. History of cocaine abuse.  REVIEW OF SYMPTOMS:  Positive for cough, shortness of breath.  Also positive for leg swelling and gout flare in right knee.  Positive  for focal weakness as well as speech difficulties with this stroke.  ALLERGIES:  No known drug allergies.  FAMILY HISTORY:  Positive for aneurysm and diabetes.  SOCIAL HISTORY:  The patient is divorced, lives alone.  Has a girlfriend.  He reports he has been smoking approximately 1 pack per day.  Has a 50 pack-year smoking history.  Does not use any smokeless tobacco.  He drinks approximately 4 mixed drinks and/or beer combination daily. Positive cocaine use.  Plans are for discharge to his sister's house in [place].  Her home is 2 level with 7 steps at entry.  FUNCTIONAL HISTORY:  The patient was independent in driving prior to admission.  FUNCTIONAL STATUS:  The patient is contact guard assist for sit to stand while pushing heavily on left upper and left lower extremity.  Contact guard for ambulating 20-40 feet with rolling walker and assistance to keep the right upper extremity in rolling walker.  Currently limited by gout flare and right knee instability.  OT has been working on fine motor movement and stereognosis right upper extremity.  PHYSICAL EXAMINATION:  GENERAL:  The patient is well-developed and well- nourished male, in no acute distress.  HEENT:  Atraumatic, normocephalic.  Oral mucosa is moist with thrush noted on tongue.  Pupils equal, round, reactive to light. NECK:  Normal range of motion.  No masses. CARDIOVASCULAR:  Irregularly irregular rhythm present, normal rate. PULMONARY:  The patient with normal respiratory effort.  Breath sounds normal.  ABDOMEN:  Soft with positive bowel sounds. MUSCULOSKELETAL:  The patient with decreased range of motion right knee as well as  effusion and tenderness to palpation.  Bilateral lower extremity edema noted left greater than right. NEUROLOGIC:  The patient is alert.  Cranial nerve deficits noted with right facial weakness and aphasia.  The patient with decreased sensation in right upper extremity.  Right upper extremity  weakness with decreased range of motion of the shoulder as well as motor apraxia with mild right pronator drift.  Decreased fine motor movements.  The patient follows basic commands without difficulty.  Nods appropriately for yes and no questions.  Occasionally noted to have delayed processing.  Speech is quite dysarthric and with 1-2 words noted.  He has a right central 7th with tongue deviation. SKIN:  Warm.  Multiple scrapes and scabs over right knee and peripatellar region.  Few scrapes on left knee as well.  HOSPITAL COURSE:  [name] was admitted to rehab on April 26, 2011, for inpatient therapies to consist of PT, OT, and speech therapy at least 3 hours 5 days a week.  Past admission, physiatrist, rehab RN, and therapy team have worked together to provide customized, collaborative, interdisciplinary care.  Rehab RN has worked with the patient on bowel and bladder program as well as safety and education.  The patient's blood pressures were checked on b.i.d. basis during this stay and these are reasonable ranging from 110-120 systolic, 70s to 161W diastolic.  Heart rate has been stable in 50s to 60s range. P.o. intake has been good.  The patient was advanced to regular diet, thin liquids for MBS on May 04, 2011.  The patient's blood sugars were checked on before meals and at bedtime basis during this stay.  On carb-modified diet, these have ranged from 90s to 110 range.  The patient's sister as well as family members were educated regarding maintaining carb modified diet past discharge.  During the patient's stay in rehab, weekly team conferences were held to monitor the patient's progress, set goals, as well as discuss barriers to discharge.  OT evaluation at admission revealed the patient with impairments in basic self-care and skill due to muscle weakness, problems with motor apraxia with impaired timing and sequencing, decreased coordination as well as  delayed processing.  He required mod assist with all basic self-care tasks.  PT evaluation revealed the patient with balance deficits with Berg score at 43/56.  He required min assist for static standing.  Min assist for mobility.  Speech Therapy has been working with the patient on oropharyngeal dysphagia as well as his moderate to severe expressive aphasia affecting all language.  By the time of discharge, the patient has progressed to being at modified independent level for all mobility without assisted device.  No further followup physical therapy is needed.  OT has worked with the patient on improving functional use of right hand and integrated use of right hand for basic tasks.  He  continues to require min verbal cues to use right hand for basic tasks.  The patient to continue with outpatient OT past discharge.  He is requiring min semantic cues to consistently follow swallow strategies with regular diet.  Family education has been done with the patient's sister regarding current swallowing and  cognitive function, as well as functional communication.  Sister has been educated on effective strategies and cues to increase overall functional communication.  The patient is also given a communication board to use while in the community.  He is currently able to name 10 items during convergent naming tasks with supervision and articulate placement cues. He is currently following 1 and 2 step commands at 75-100% accuracy. His basic primary mode of expression is nonverbal with the patient using gestures to communicate.  He is 75-100% accurate for repetition of simple words.  He is overall supervision for comprehension of basic information.  Further followup outpatient speech therapy and OT to continue past discharge.  On May 05, 2011, the patient is discharged to home.  DISCHARGE MEDICATIONS: 1. Coated aspirin 325 mg p.o. per day. 2. Colchicine 0.6 mg p.o. per day. 3. Ferrous  sulfate 325 mg a day. 4. Lisinopril 10 mg per day. 5. Metoprolol 25 mg p.o. per day. 6. Nicotine patch 21 mg change daily for additional 3 weeks. 7. Nitroglycerin 0.3 mg per day. 8. Zocor 10 mg p.o. per day.  DIET:  Carb modified medium.  ACTIVITY LEVEL:  At 24 hour supervision.  Walk independently, no alcohol.  Special instructions: Supervision when out of home environment.  No drugs, do not smoke with nicotine patch on. Nicotine taper:  The patient advised on continuing nicotine at 24 mg a day for now.  He is to reduce to 14 mg x1 month on May 21, 2011, and further decrease to 7 mg x4 weeks, then stop.  FOLLOWUP:  The patient to follow up with Dr. [name] Sorenson on July 06, 2011, at 10:15.  Eligibility appointment on June 04, 2011, at 11:30. Follow up with Dr. Riley Kill on June 08, 2011,  at 10:30 for 11 a.m. appointment.  ADDENDUM:  The patient had routine labs checked past admission with lytes revealing sodium 140, potassium 3.7, chloride 102, CO2 27, BUN 14, creatinine 0.83.  Check of CBC revealed the patient to continue with anemia with hemoglobin 9.7, hematocrit 29.7, white count 4.6, platelets 196.  Iron studies were done revealing iron at 45, UIBC at 115, TIBC 260, ferritin 540, iron saturation 17%.  Retic count at 0.7.  The patient was started on iron supplement during this stay.  Repeat CBC of May 05, 2011, shows some improvement with hemoglobin 10.6, hematocrit 32.4, white count 3.9, platelets 216.  The patient will need further followup and workup of anemia past discharge.     Delle Reining, P.A.   ______________________________ Ranelle Oyster, M.D.    PL/MEDQ  D:  05/05/2011  T:  05/06/2011  Job:  [id number]  cc:   [facility]

## 2011-05-07 ENCOUNTER — Ambulatory Visit: Payer: Self-pay | Attending: Physical Medicine & Rehabilitation

## 2011-05-07 DIAGNOSIS — IMO0001 Reserved for inherently not codable concepts without codable children: Secondary | ICD-10-CM | POA: Insufficient documentation

## 2011-05-07 DIAGNOSIS — I69920 Aphasia following unspecified cerebrovascular disease: Secondary | ICD-10-CM | POA: Insufficient documentation

## 2011-05-13 ENCOUNTER — Ambulatory Visit: Payer: Self-pay

## 2011-05-17 ENCOUNTER — Ambulatory Visit: Payer: Self-pay | Admitting: Speech Pathology

## 2011-05-19 ENCOUNTER — Ambulatory Visit: Payer: Medicaid Other | Attending: Physical Medicine & Rehabilitation | Admitting: Speech Pathology

## 2011-05-19 DIAGNOSIS — IMO0001 Reserved for inherently not codable concepts without codable children: Secondary | ICD-10-CM | POA: Insufficient documentation

## 2011-05-19 DIAGNOSIS — I69920 Aphasia following unspecified cerebrovascular disease: Secondary | ICD-10-CM | POA: Insufficient documentation

## 2011-05-20 ENCOUNTER — Ambulatory Visit: Payer: Medicaid Other

## 2011-05-24 ENCOUNTER — Ambulatory Visit: Payer: Medicaid Other | Admitting: Speech Pathology

## 2011-05-26 ENCOUNTER — Ambulatory Visit: Payer: Medicaid Other | Admitting: Speech Pathology

## 2011-05-26 ENCOUNTER — Ambulatory Visit: Payer: Medicaid Other | Admitting: Occupational Therapy

## 2011-05-27 ENCOUNTER — Ambulatory Visit: Payer: Medicaid Other | Admitting: Occupational Therapy

## 2011-05-27 ENCOUNTER — Ambulatory Visit: Payer: Medicaid Other

## 2011-05-31 ENCOUNTER — Ambulatory Visit: Payer: Medicaid Other | Admitting: Occupational Therapy

## 2011-05-31 ENCOUNTER — Ambulatory Visit: Payer: Medicaid Other | Admitting: Speech Pathology

## 2011-06-02 ENCOUNTER — Ambulatory Visit: Payer: Medicaid Other | Admitting: Speech Pathology

## 2011-06-02 ENCOUNTER — Ambulatory Visit: Payer: Medicaid Other | Admitting: Occupational Therapy

## 2011-06-07 ENCOUNTER — Ambulatory Visit: Payer: Medicaid Other | Admitting: Speech Pathology

## 2011-06-07 ENCOUNTER — Ambulatory Visit: Payer: Medicaid Other | Admitting: Occupational Therapy

## 2011-06-08 ENCOUNTER — Encounter: Payer: Medicaid Other | Attending: Physical Medicine & Rehabilitation | Admitting: Physical Medicine & Rehabilitation

## 2011-06-09 ENCOUNTER — Emergency Department (HOSPITAL_COMMUNITY)
Admission: EM | Admit: 2011-06-09 | Discharge: 2011-06-09 | Disposition: A | Discharge status: Home / Self Care | Payer: Medicaid Other | Attending: Emergency Medicine | Admitting: Emergency Medicine

## 2011-06-09 ENCOUNTER — Other Ambulatory Visit: Payer: Self-pay

## 2011-06-09 ENCOUNTER — Ambulatory Visit: Payer: Medicaid Other | Admitting: Speech Pathology

## 2011-06-09 ENCOUNTER — Encounter (HOSPITAL_COMMUNITY): Payer: Self-pay

## 2011-06-09 ENCOUNTER — Ambulatory Visit: Payer: Medicaid Other | Admitting: Occupational Therapy

## 2011-06-09 DIAGNOSIS — Z86718 Personal history of other venous thrombosis and embolism: Secondary | ICD-10-CM | POA: Insufficient documentation

## 2011-06-09 DIAGNOSIS — Z8673 Personal history of transient ischemic attack (TIA), and cerebral infarction without residual deficits: Secondary | ICD-10-CM | POA: Insufficient documentation

## 2011-06-09 DIAGNOSIS — E119 Type 2 diabetes mellitus without complications: Secondary | ICD-10-CM | POA: Insufficient documentation

## 2011-06-09 DIAGNOSIS — I10 Essential (primary) hypertension: Secondary | ICD-10-CM

## 2011-06-09 DIAGNOSIS — Z79899 Other long term (current) drug therapy: Secondary | ICD-10-CM | POA: Insufficient documentation

## 2011-06-09 DIAGNOSIS — Z8639 Personal history of other endocrine, nutritional and metabolic disease: Secondary | ICD-10-CM | POA: Insufficient documentation

## 2011-06-09 DIAGNOSIS — Z862 Personal history of diseases of the blood and blood-forming organs and certain disorders involving the immune mechanism: Secondary | ICD-10-CM | POA: Insufficient documentation

## 2011-06-09 HISTORY — DX: Cerebral infarction, unspecified: I63.9

## 2011-06-09 MED ORDER — METOPROLOL TARTRATE 25 MG PO TABS: 25.0000 mg | ORAL_TABLET | Freq: Two times a day (BID) | ORAL | Status: DC

## 2011-06-09 MED ORDER — METOPROLOL TARTRATE 25 MG PO TABS
25.0000 mg | ORAL_TABLET | Freq: Once | ORAL | Status: AC
  Administered 2011-06-09: 25 mg via ORAL
  Filled 2011-06-09: qty 1

## 2011-06-09 MED ORDER — LISINOPRIL 20 MG PO TABS: 10.0000 mg | ORAL_TABLET | Freq: Every day | ORAL | Status: DC

## 2011-06-09 MED ORDER — LISINOPRIL 10 MG PO TABS
10.0000 mg | ORAL_TABLET | Freq: Once | ORAL | Status: AC
  Administered 2011-06-09: 10 mg via ORAL
  Filled 2011-06-09: qty 1

## 2011-06-09 NOTE — ED Notes (Signed)
Pt. Has a hx of elevated BP and stroke,  And ws concerned today that his BP is too high, Pt. Denies any pain or discomfort.  Pt. Does have slurred speech, but  Reports that is from his last stroke.  No other neurological deficits noted

## 2011-06-09 NOTE — ED Notes (Signed)
Patient arrived with concerns over BP.  Has a hx of hypertension and old CVA.  BP 184/92 on arrival to room. Cardiac monitors applied.  Patient denies pain, headache but do complain of slight dizziness at times.

## 2011-06-09 NOTE — ED Provider Notes (Addendum)
History     CSN: [id number]  Arrival date & time 06/09/11  1212   First MD Initiated Contact with Patient 06/09/11 1313      Chief Complaint  Patient presents with  . Hypertension    (Consider location/radiation/quality/duration/timing/severity/associated sxs/prior treatment) HPI Comments: Patient is currently living with his sibling who is also present. History is obtained from both the patient and the patient's family member. He currently lives at her home and apparently they go to perform physical therapy. He was bare and the physical therapist was concerned about his significantly elevated high blood pressure. The patient reports that he ran out of his blood pressure medicine about 2 days ago. He does not currently have a primary care physician, was told to followup with [facility] following his discharge from the hospital for an acute stroke. He has baseline slurred speech and right upper chest he weakness which is at his baseline. He denies any new headache, blurred vision, back pain, chest pain, shortness of breath, flank pain, abdominal pain.  Patient is a 61 y.o. male presenting with hypertension. The history is provided by the patient and a relative.  Hypertension Pertinent negatives include no chest pain, no abdominal pain and no headaches.    Past Medical History  Diagnosis Date  . History of deep vein thrombosis 2006  . Hypertension   . Diabetes mellitus   . Gout current and history of  . History of tobacco abuse     has smoked for 50 years  . Cocaine abuse     + UDS on admission  . Stroke     History reviewed. No pertinent past surgical history.  Family History  Problem Relation Age of Onset  . Aneurysm Mother   . Diabetes Father     History  Substance Use Topics  . Smoking status: Current Everyday Smoker -- 0.5 packs/day for 50 years    Types: Cigarettes  . Smokeless tobacco: Not on file  . Alcohol Use:       Review of Systems    Constitutional: Negative for fever and chills.  Eyes: Negative for visual disturbance.  Cardiovascular: Negative for chest pain.  Gastrointestinal: Negative for abdominal pain.  Neurological: Negative for dizziness, light-headedness and headaches.  All other systems reviewed and are negative.    Allergies  Review of patient's allergies indicates no known allergies.  Home Medications   Current Outpatient Rx  Name Route Sig Dispense Refill  . LISINOPRIL 10 MG PO TABS Oral Take 1 tablet (10 mg total) by mouth daily. 30 tablet 1  . METOPROLOL TARTRATE 25 MG PO TABS Oral Take 1 tablet (25 mg total) by mouth 2 (two) times daily. 60 tablet 1  . SIMVASTATIN 10 MG PO TABS Oral Take 1 tablet (10 mg total) by mouth daily at 6 PM. 30 tablet 1  . LISINOPRIL 20 MG PO TABS Oral Take 0.5 tablets (10 mg total) by mouth daily. 30 tablet 0  . METOPROLOL TARTRATE 25 MG PO TABS Oral Take 1 tablet (25 mg total) by mouth 2 (two) times daily. 60 tablet 0    BP 175/99  Pulse 59  Temp(Src) 98 F (36.7 C) (Oral)  Resp 16  SpO2 99%  Physical Exam  Vitals reviewed. Constitutional: He appears well-developed and well-nourished.  HENT:  Head: Normocephalic and atraumatic.  Eyes: Pupils are equal, round, and reactive to light.  Neck: Normal range of motion. Neck supple. No JVD present.  Cardiovascular: Normal rate and regular rhythm.  Pulmonary/Chest: Effort normal. No respiratory distress.  Abdominal: Soft. He exhibits no distension. There is no tenderness.  Neurological: He is alert.  Skin: Skin is warm and dry. No rash noted. No erythema.  Psychiatric: He has a normal mood and affect.    ED Course  Procedures (including critical care time)  Labs Reviewed - No data to display No results found.   1. Hypertension     ECG performed at 13:22, shows a sinus rhythm at rate 59. First degree AV block is noted with a PR interval of 252. Axis is normal. RSR prime is noted in leads V1 and V2 which  are probably normal variant. There is no ST or T wave abnormalities acutely. There no prior EKGs available.  MDM   Patient's blood pressure is elevated here at about 170/90. Likely this is due to chronic history and noncompliance with medication, having run out. He is on his last discharge summary, the patient is supposed to be on metoprolol 25 mg once daily as well as lisinopril 10 mg once daily. These medications are ordered and my plan is to give him prescriptions for both. His family and patient agree that he does need followup with a primary care physician. Although the discharge summary reports metoprolol once daily, the [facility] reports that he is supposed to be on 25 mg twice daily. There is no evidence of acute end organ damage or findings of a new stroke. He has no chest pain. I feel comfortable that he can be discharged home with refills of his blood pressure medication. He knows the importance of following up in establishing with a primary care physician.        Gavin Pound. Oletta Lamas, MD 06/09/11 1451  Gavin Pound. Oracio Galen, MD 06/09/11 1539

## 2011-06-16 ENCOUNTER — Ambulatory Visit: Payer: Medicaid Other | Admitting: Occupational Therapy

## 2011-06-16 ENCOUNTER — Ambulatory Visit: Payer: Medicaid Other

## 2011-06-18 ENCOUNTER — Encounter: Payer: Self-pay | Admitting: Occupational Therapy

## 2011-06-21 ENCOUNTER — Encounter: Payer: Self-pay | Admitting: Occupational Therapy

## 2011-06-21 ENCOUNTER — Encounter: Payer: Self-pay | Admitting: Speech Pathology

## 2011-06-23 ENCOUNTER — Encounter: Payer: Self-pay | Admitting: Occupational Therapy

## 2011-06-23 ENCOUNTER — Encounter: Payer: Self-pay | Admitting: Speech Pathology

## 2011-06-29 ENCOUNTER — Ambulatory Visit: Payer: Medicaid Other | Attending: Physical Medicine & Rehabilitation

## 2011-06-29 DIAGNOSIS — IMO0001 Reserved for inherently not codable concepts without codable children: Secondary | ICD-10-CM | POA: Insufficient documentation

## 2011-06-29 DIAGNOSIS — I69920 Aphasia following unspecified cerebrovascular disease: Secondary | ICD-10-CM | POA: Insufficient documentation

## 2011-06-30 ENCOUNTER — Encounter: Payer: Self-pay | Admitting: Occupational Therapy

## 2011-06-30 ENCOUNTER — Encounter: Payer: Self-pay | Admitting: Speech Pathology

## 2011-07-05 ENCOUNTER — Encounter: Payer: Self-pay | Admitting: Occupational Therapy

## 2011-07-06 ENCOUNTER — Encounter: Payer: Medicaid Other | Attending: Physical Medicine & Rehabilitation | Admitting: Physical Medicine & Rehabilitation

## 2011-07-06 ENCOUNTER — Encounter: Payer: Self-pay | Admitting: Physical Medicine & Rehabilitation

## 2011-07-06 VITALS — BP 162/99 | HR 66 | Resp 16 | Ht 74.0 in | Wt 238.0 lb

## 2011-07-06 DIAGNOSIS — I69322 Dysarthria following cerebral infarction: Secondary | ICD-10-CM

## 2011-07-06 DIAGNOSIS — I10 Essential (primary) hypertension: Secondary | ICD-10-CM | POA: Insufficient documentation

## 2011-07-06 DIAGNOSIS — I6350 Cerebral infarction due to unspecified occlusion or stenosis of unspecified cerebral artery: Secondary | ICD-10-CM | POA: Insufficient documentation

## 2011-07-06 DIAGNOSIS — I639 Cerebral infarction, unspecified: Secondary | ICD-10-CM

## 2011-07-06 DIAGNOSIS — I69922 Dysarthria following unspecified cerebrovascular disease: Secondary | ICD-10-CM

## 2011-07-06 MED ORDER — LISINOPRIL 20 MG PO TABS: 20.0000 mg | ORAL_TABLET | Freq: Every day | ORAL | Status: DC

## 2011-07-06 NOTE — Progress Notes (Addendum)
  Subjective:    Patient ID: [name], male    DOB: 17-Dec-1950, 61 y.o.   MRN: [id number]  Cerebrovascular Accident This is a new problem. The current episode started more than 1 month ago. Associated symptoms include coughing and fatigue. Associated symptoms comments: Shortness of Breath.  pt was admitted to inpatient rehab in December.  Has been home a month. Girlfriend is still helping at home with meals and housework.  He is dressing and bathing himself Still having problems with speech.  Swallowing is much improved.  Balance is improving.  Not needing a walker.  No falls. Occasional pain in right wrist. Right wrist is numb, and he occasionally drops things but getting no worse.    Review of Systems  Constitutional: Positive for fatigue.  HENT: Negative.   Eyes: Negative.   Respiratory: Positive for cough and shortness of breath.   Cardiovascular: Negative.   Gastrointestinal: Negative.   Genitourinary: Negative.   Musculoskeletal: Negative.   Skin: Negative.   Neurological: Positive for speech difficulty.  Hematological: Negative.   Psychiatric/Behavioral: Negative.        Objective:   Physical Exam  Constitutional: He is oriented to person, place, and time. He appears well-developed.  HENT:  Head: Normocephalic.  Eyes: EOM are normal. Pupils are equal, round, and reactive to light.  Neck: Normal range of motion.  Cardiovascular: Normal rate.   Pulmonary/Chest: Effort normal.  Abdominal: Soft.  Musculoskeletal: Normal range of motion.       Right wrist non-tender. Non swollen  Neurological: He is oriented to person, place, and time. A cranial nerve deficit is present. Coordination abnormal.  Reflex Scores:      Tricep reflexes are 2+ on the right side and 2+ on the left side.      Bicep reflexes are 2+ on the right side and 2+ on the left side.      Brachioradialis reflexes are 2+ on the right side and 2+ on the left side.      Patellar reflexes are 2+ on the  right side and 2+ on the left side.      Achilles reflexes are 2+ on the right side and 2+ on the left side.      Mild sensory loss over right arm and leg.  NO pronator drift. Good standing and walking balance. Had some difficulty with heel to toe gait.  Strength in RUE is 4to 4+.  Elsewhere he's 5/5.  Speech is severely dysarthric and he's difficult to understand.  Mild right 7 sign.  Cognitively he's grossly intact withreasonable insight and awareness. Phalens' test negative at right wrist.          Assessment & Plan:  1. Left CVA with right hemisensory loss and severe dysarthria  -pt is independent at the house hold level.  -made a referral for outpt speech follow up.  -discussed sensory deficits as they are relatd to his stroke.  2. HTN-  -increased lisinopril to 20mg  qd  -made a referral to IM at [facility]   3. Follow up with me in 3 months.  Overall he's made nice progress!

## 2011-07-06 NOTE — Patient Instructions (Signed)
You need to see a family practice physician or internist to manage your blood pressure.  i have increased your lisinopril to 20mg  daily to better control your blood pressure.   A referral was made for further speech therapy at [facility].

## 2011-07-07 ENCOUNTER — Encounter: Payer: Self-pay | Admitting: Occupational Therapy

## 2011-07-12 ENCOUNTER — Encounter: Payer: Self-pay | Admitting: Occupational Therapy

## 2011-07-19 ENCOUNTER — Telehealth: Payer: Self-pay | Admitting: Physical Medicine & Rehabilitation

## 2011-07-19 NOTE — Telephone Encounter (Signed)
Does Dr Riley Kill want to see patient before he goes to therapy in [place]?

## 2011-07-19 NOTE — Telephone Encounter (Signed)
i just saw him 2 weeks ago!  He doesn't need to see me again before therapy

## 2011-07-20 NOTE — Telephone Encounter (Signed)
Pt aware by message.

## 2011-07-22 ENCOUNTER — Encounter: Payer: Self-pay | Admitting: Physical Medicine & Rehabilitation

## 2011-08-16 ENCOUNTER — Encounter: Payer: Self-pay | Admitting: Physical Medicine & Rehabilitation

## 2011-08-18 ENCOUNTER — Telehealth: Payer: Self-pay | Admitting: *Deleted

## 2011-08-18 DIAGNOSIS — I639 Cerebral infarction, unspecified: Secondary | ICD-10-CM

## 2011-08-18 DIAGNOSIS — I10 Essential (primary) hypertension: Secondary | ICD-10-CM

## 2011-08-18 NOTE — Telephone Encounter (Signed)
Needs refills on meds given at hospital discharge. Lisinopril, zocor, and lopressor. No PCP, was not on these meds prior to hospitalization. OK to refill?

## 2011-08-18 NOTE — Telephone Encounter (Signed)
"  Calling about [employer]". No other message left and no name for the caller given. (was not Earlene Plater)

## 2011-08-19 MED ORDER — METOPROLOL TARTRATE 25 MG PO TABS: 25.0000 mg | ORAL_TABLET | Freq: Two times a day (BID) | ORAL | Status: DC

## 2011-08-19 MED ORDER — SIMVASTATIN 10 MG PO TABS: 10.0000 mg | ORAL_TABLET | Freq: Every day | ORAL | Status: DC

## 2011-08-19 MED ORDER — LISINOPRIL 20 MG PO TABS: 20.0000 mg | ORAL_TABLET | Freq: Every day | ORAL | Status: DC

## 2011-08-20 NOTE — Telephone Encounter (Signed)
i refilled all of these Wednesday i think

## 2011-08-26 ENCOUNTER — Encounter: Payer: Self-pay | Admitting: Physical Medicine & Rehabilitation

## 2011-09-01 DIAGNOSIS — I69328 Other speech and language deficits following cerebral infarction: Secondary | ICD-10-CM | POA: Insufficient documentation

## 2011-09-07 ENCOUNTER — Ambulatory Visit: Payer: Self-pay | Admitting: Specialist

## 2011-09-08 ENCOUNTER — Emergency Department: Payer: Self-pay | Admitting: Emergency Medicine

## 2011-09-09 LAB — CBC
MCH: 25.1 pg — ABNORMAL LOW (ref 26.0–34.0)
MCHC: 32 g/dL (ref 32.0–36.0)
MCV: 79 fL — ABNORMAL LOW (ref 80–100)
Platelet: 150 10*3/uL (ref 150–440)

## 2011-09-09 LAB — PRO B NATRIURETIC PEPTIDE: B-Type Natriuretic Peptide: 685 pg/mL — ABNORMAL HIGH (ref 0–125)

## 2011-09-09 LAB — COMPREHENSIVE METABOLIC PANEL
Albumin: 3.6 g/dL (ref 3.4–5.0)
Anion Gap: 10 (ref 7–16)
Bilirubin,Total: 0.3 mg/dL (ref 0.2–1.0)
EGFR (African American): >60
Glucose: 102 mg/dL — ABNORMAL HIGH (ref 65–99)
Potassium: 3.8 mmol/L (ref 3.5–5.1)
SGOT(AST): 29 U/L (ref 15–37)
SGPT (ALT): 20 U/L

## 2011-09-15 ENCOUNTER — Encounter: Payer: Self-pay | Admitting: Physical Medicine & Rehabilitation

## 2011-09-22 DIAGNOSIS — M109 Gout, unspecified: Secondary | ICD-10-CM | POA: Insufficient documentation

## 2011-09-23 ENCOUNTER — Telehealth: Payer: Self-pay | Admitting: Physical Medicine & Rehabilitation

## 2011-09-23 NOTE — Telephone Encounter (Signed)
Needs 2 page progress Speech POC signed and faxed back to [phone].

## 2011-09-27 NOTE — Telephone Encounter (Signed)
LM with [place] letting her know that we have not received any paperwork and to please re-fax it to [place].

## 2011-09-29 ENCOUNTER — Encounter: Payer: Medicaid Other | Attending: Physical Medicine & Rehabilitation | Admitting: Physical Medicine & Rehabilitation

## 2011-09-29 DIAGNOSIS — I69922 Dysarthria following unspecified cerebrovascular disease: Secondary | ICD-10-CM | POA: Insufficient documentation

## 2011-09-29 DIAGNOSIS — I10 Essential (primary) hypertension: Secondary | ICD-10-CM | POA: Insufficient documentation

## 2011-09-29 DIAGNOSIS — I6350 Cerebral infarction due to unspecified occlusion or stenosis of unspecified cerebral artery: Secondary | ICD-10-CM | POA: Insufficient documentation

## 2011-09-30 ENCOUNTER — Telehealth: Payer: Self-pay | Admitting: *Deleted

## 2011-09-30 NOTE — Telephone Encounter (Signed)
[name] needs to fax paperwork that was discussed last Friday to [place] please. Her fax # is [phone].

## 2011-10-16 ENCOUNTER — Encounter: Payer: Self-pay | Admitting: Physical Medicine & Rehabilitation

## 2011-11-15 ENCOUNTER — Encounter: Payer: Self-pay | Admitting: Physical Medicine & Rehabilitation

## 2012-05-17 ENCOUNTER — Ambulatory Visit: Payer: Self-pay | Admitting: Oncology

## 2012-06-05 ENCOUNTER — Inpatient Hospital Stay: Payer: Self-pay | Admitting: Internal Medicine

## 2012-06-05 LAB — CBC
HCT: 36.3 % — ABNORMAL LOW (ref 40.0–52.0)
MCHC: 32.6 g/dL (ref 32.0–36.0)
MCV: 76 fL — ABNORMAL LOW (ref 80–100)
RBC: 4.8 10*6/uL (ref 4.40–5.90)
RDW: 17.5 % — ABNORMAL HIGH (ref 11.5–14.5)

## 2012-06-05 LAB — CK TOTAL AND CKMB (NOT AT ARMC)
CK, Total: 196 U/L (ref 35–232)
CK, Total: 141 U/L (ref 35–232)
CK-MB: 2 ng/mL (ref 0.5–3.6)
CK-MB: 1.7 ng/mL (ref 0.5–3.6)

## 2012-06-05 LAB — TROPONIN I: Troponin-I: <0.02 ng/mL

## 2012-06-05 LAB — BASIC METABOLIC PANEL
Anion Gap: 7 (ref 7–16)
BUN: 14 mg/dL (ref 7–18)
Chloride: 107 mmol/L (ref 98–107)
Creatinine: 0.89 mg/dL (ref 0.60–1.30)
EGFR (African American): >60
EGFR (Non-African Amer.): >60
Glucose: 100 mg/dL — ABNORMAL HIGH (ref 65–99)
Potassium: 3.9 mmol/L (ref 3.5–5.1)

## 2012-06-05 LAB — IRON AND TIBC
Iron Bind.Cap.(Total): 338 ug/dL (ref 250–450)
Iron Saturation: 14 %
Iron: 47 ug/dL — ABNORMAL LOW (ref 65–175)
Unbound Iron-Bind.Cap.: 291 ug/dL

## 2012-06-05 LAB — PROTIME-INR: INR: 1

## 2012-06-06 LAB — LIPID PANEL
Cholesterol: 124 mg/dL (ref 0–200)
HDL Cholesterol: 53 mg/dL (ref 40–60)
Ldl Cholesterol, Calc: 56 mg/dL (ref 0–100)
Triglycerides: 74 mg/dL (ref 0–200)
VLDL Cholesterol, Calc: 15 mg/dL (ref 5–40)

## 2012-06-17 ENCOUNTER — Ambulatory Visit: Payer: Self-pay | Admitting: Oncology

## 2012-06-19 DIAGNOSIS — I2699 Other pulmonary embolism without acute cor pulmonale: Secondary | ICD-10-CM | POA: Insufficient documentation

## 2013-01-25 ENCOUNTER — Emergency Department: Payer: Self-pay | Admitting: Emergency Medicine

## 2013-01-25 LAB — COMPREHENSIVE METABOLIC PANEL
Alkaline Phosphatase: 96 U/L (ref 50–136)
Anion Gap: 6 — ABNORMAL LOW (ref 7–16)
BUN: 12 mg/dL (ref 7–18)
Co2: 27 mmol/L (ref 21–32)
Creatinine: 0.82 mg/dL (ref 0.60–1.30)
EGFR (Non-African Amer.): >60
Osmolality: 279 (ref 275–301)
Potassium: 3.8 mmol/L (ref 3.5–5.1)
SGOT(AST): 37 U/L (ref 15–37)
Total Protein: 6.7 g/dL (ref 6.4–8.2)

## 2013-01-25 LAB — CK TOTAL AND CKMB (NOT AT ARMC): CK, Total: 195 U/L (ref 35–232)

## 2013-01-25 LAB — CBC
HGB: 10.6 g/dL — ABNORMAL LOW (ref 13.0–18.0)
MCH: 24.4 pg — ABNORMAL LOW (ref 26.0–34.0)
MCHC: 32.7 g/dL (ref 32.0–36.0)
Platelet: 135 10*3/uL — ABNORMAL LOW (ref 150–440)
RBC: 4.33 10*6/uL — ABNORMAL LOW (ref 4.40–5.90)
WBC: 3.6 10*3/uL — ABNORMAL LOW (ref 3.8–10.6)

## 2013-01-25 LAB — URINALYSIS, COMPLETE
Hyaline Cast: 2
Ketone: NEGATIVE
Leukocyte Esterase: NEGATIVE
Protein: >=500
RBC,UR: 2 /HPF (ref 0–5)
Specific Gravity: 1.026 (ref 1.003–1.030)
WBC UR: 3 /HPF (ref 0–5)

## 2013-01-25 LAB — PRO B NATRIURETIC PEPTIDE: B-Type Natriuretic Peptide: 1819 pg/mL — ABNORMAL HIGH (ref 0–125)

## 2013-01-25 LAB — PROTIME-INR: Prothrombin Time: 19 secs — ABNORMAL HIGH (ref 11.5–14.7)

## 2013-05-12 ENCOUNTER — Inpatient Hospital Stay: Payer: Self-pay | Admitting: Internal Medicine

## 2013-05-12 LAB — BASIC METABOLIC PANEL
Anion Gap: 4 — ABNORMAL LOW (ref 7–16)
Calcium, Total: 8.9 mg/dL (ref 8.5–10.1)
Chloride: 104 mmol/L (ref 98–107)
EGFR (African American): >60
Sodium: 136 mmol/L (ref 136–145)

## 2013-05-12 LAB — CBC WITH DIFFERENTIAL/PLATELET
Basophil %: 0.4 %
Eosinophil #: 0.1 10*3/uL (ref 0.0–0.7)
HGB: 11.5 g/dL — ABNORMAL LOW (ref 13.0–18.0)
Lymphocyte #: 1.9 10*3/uL (ref 1.0–3.6)
Lymphocyte %: 39.3 %
MCH: 23.7 pg — ABNORMAL LOW (ref 26.0–34.0)
MCHC: 32.3 g/dL (ref 32.0–36.0)
MCV: 73 fL — ABNORMAL LOW (ref 80–100)
Monocyte #: 0.4 x10 3/mm (ref 0.2–1.0)
Monocyte %: 7.7 %
Neutrophil #: 2.4 10*3/uL (ref 1.4–6.5)
Neutrophil %: 50.2 %
RBC: 4.86 10*6/uL (ref 4.40–5.90)
WBC: 4.8 10*3/uL (ref 3.8–10.6)

## 2013-05-12 LAB — APTT: Activated PTT: 58.2 secs — ABNORMAL HIGH (ref 23.6–35.9)

## 2013-05-13 ENCOUNTER — Ambulatory Visit: Payer: Self-pay

## 2013-05-13 LAB — BASIC METABOLIC PANEL
Anion Gap: 4 — ABNORMAL LOW (ref 7–16)
EGFR (Non-African Amer.): >60
Osmolality: 275 (ref 275–301)
Potassium: 3.3 mmol/L — ABNORMAL LOW (ref 3.5–5.1)
Sodium: 137 mmol/L (ref 136–145)

## 2013-05-13 LAB — CBC WITH DIFFERENTIAL/PLATELET
Eosinophil #: 0.1 10*3/uL (ref 0.0–0.7)
Eosinophil %: 2.1 %
HCT: 35.2 % — ABNORMAL LOW (ref 40.0–52.0)
HGB: 11.4 g/dL — ABNORMAL LOW (ref 13.0–18.0)
Lymphocyte #: 1.4 10*3/uL (ref 1.0–3.6)
Lymphocyte %: 35.6 %
MCH: 23.7 pg — ABNORMAL LOW (ref 26.0–34.0)
MCHC: 32.4 g/dL (ref 32.0–36.0)
MCV: 73 fL — ABNORMAL LOW (ref 80–100)
Neutrophil %: 51.4 %
Platelet: 145 10*3/uL — ABNORMAL LOW (ref 150–440)

## 2013-05-13 LAB — APTT: Activated PTT: >160 secs (ref 23.6–35.9)

## 2013-05-13 LAB — PROTIME-INR: INR: 2.4

## 2013-05-14 LAB — CBC WITH DIFFERENTIAL/PLATELET
Eosinophil #: 0 10*3/uL (ref 0.0–0.7)
HCT: 33.7 % — ABNORMAL LOW (ref 40.0–52.0)
HGB: 11 g/dL — ABNORMAL LOW (ref 13.0–18.0)
Lymphocyte #: 1.6 10*3/uL (ref 1.0–3.6)
Lymphocyte %: 28 %
MCH: 23.8 pg — ABNORMAL LOW (ref 26.0–34.0)
MCV: 73 fL — ABNORMAL LOW (ref 80–100)
Monocyte #: 0.7 x10 3/mm (ref 0.2–1.0)
Monocyte %: 11.5 %
Platelet: 145 10*3/uL — ABNORMAL LOW (ref 150–440)
RBC: 4.61 10*6/uL (ref 4.40–5.90)
RDW: 17.6 % — ABNORMAL HIGH (ref 11.5–14.5)
WBC: 5.9 10*3/uL (ref 3.8–10.6)

## 2013-05-14 LAB — PROTIME-INR
INR: 1.8
Prothrombin Time: 20.6 secs — ABNORMAL HIGH (ref 11.5–14.7)

## 2013-05-14 LAB — BASIC METABOLIC PANEL
Anion Gap: 4 — ABNORMAL LOW (ref 7–16)
Calcium, Total: 8.9 mg/dL (ref 8.5–10.1)
Co2: 26 mmol/L (ref 21–32)

## 2013-07-15 ENCOUNTER — Emergency Department: Payer: Self-pay | Admitting: Internal Medicine

## 2013-07-15 LAB — CBC
HCT: 33.9 % — ABNORMAL LOW (ref 40.0–52.0)
HGB: 10.9 g/dL — ABNORMAL LOW (ref 13.0–18.0)
MCH: 24.2 pg — AB (ref 26.0–34.0)
MCHC: 32.2 g/dL (ref 32.0–36.0)
MCV: 75 fL — AB (ref 80–100)
Platelet: 134 10*3/uL — ABNORMAL LOW (ref 150–440)
RBC: 4.51 10*6/uL (ref 4.40–5.90)
RDW: 18.2 % — AB (ref 11.5–14.5)
WBC: 3.9 10*3/uL (ref 3.8–10.6)

## 2013-07-15 LAB — COMPREHENSIVE METABOLIC PANEL
ALT: 19 U/L (ref 12–78)
ANION GAP: 5 — AB (ref 7–16)
Albumin: 2.9 g/dL — ABNORMAL LOW (ref 3.4–5.0)
Alkaline Phosphatase: 79 U/L
BUN: 10 mg/dL (ref 7–18)
Bilirubin,Total: 0.5 mg/dL (ref 0.2–1.0)
CO2: 26 mmol/L (ref 21–32)
CREATININE: 0.82 mg/dL (ref 0.60–1.30)
Calcium, Total: 8.5 mg/dL (ref 8.5–10.1)
Chloride: 106 mmol/L (ref 98–107)
EGFR (Non-African Amer.): >60
Glucose: 84 mg/dL (ref 65–99)
OSMOLALITY: 272 (ref 275–301)
POTASSIUM: 3.8 mmol/L (ref 3.5–5.1)
SGOT(AST): 21 U/L (ref 15–37)
Sodium: 137 mmol/L (ref 136–145)
Total Protein: 6.8 g/dL (ref 6.4–8.2)

## 2013-07-15 LAB — SYNOVIAL CELL COUNT + DIFF, W/ CRYSTALS
BASOS ABS: 0 %
Eosinophil: 0 %
LYMPHS PCT: 3 %
Neutrophils: 83 %
Nucleated Cell Count: 1411 /mm3
OTHER MONONUCLEAR CELLS: 14 %
Other Cells BF: 0 %

## 2013-07-15 LAB — PROTIME-INR
INR: 2.2
Prothrombin Time: 23.7 secs — ABNORMAL HIGH (ref 11.5–14.7)

## 2013-07-15 LAB — TROPONIN I: Troponin-I: <0.02 ng/mL

## 2013-07-15 LAB — URIC ACID: URIC ACID: 7.7 mg/dL — AB (ref 3.5–7.2)

## 2013-07-19 LAB — BODY FLUID CULTURE

## 2013-10-03 ENCOUNTER — Emergency Department: Payer: Self-pay | Admitting: Emergency Medicine

## 2013-10-03 LAB — PROTIME-INR
INR: 1.4
Prothrombin Time: 17.4 secs — ABNORMAL HIGH (ref 11.5–14.7)

## 2013-10-03 LAB — COMPREHENSIVE METABOLIC PANEL
AST: 31 U/L (ref 15–37)
Albumin: 3.1 g/dL — ABNORMAL LOW (ref 3.4–5.0)
Alkaline Phosphatase: 84 U/L
Anion Gap: 3 — ABNORMAL LOW (ref 7–16)
BUN: 12 mg/dL (ref 7–18)
Bilirubin,Total: 0.7 mg/dL (ref 0.2–1.0)
Calcium, Total: 8.5 mg/dL (ref 8.5–10.1)
Chloride: 107 mmol/L (ref 98–107)
Co2: 28 mmol/L (ref 21–32)
Creatinine: 0.94 mg/dL (ref 0.60–1.30)
Glucose: 84 mg/dL (ref 65–99)
Osmolality: 275 (ref 275–301)
POTASSIUM: 3.4 mmol/L — AB (ref 3.5–5.1)
SGPT (ALT): 20 U/L (ref 12–78)
SODIUM: 138 mmol/L (ref 136–145)
Total Protein: 6.8 g/dL (ref 6.4–8.2)

## 2013-10-03 LAB — CBC
HCT: 35.7 % — AB (ref 40.0–52.0)
HGB: 11.3 g/dL — ABNORMAL LOW (ref 13.0–18.0)
MCH: 24 pg — ABNORMAL LOW (ref 26.0–34.0)
MCHC: 31.6 g/dL — AB (ref 32.0–36.0)
MCV: 76 fL — ABNORMAL LOW (ref 80–100)
Platelet: 125 10*3/uL — ABNORMAL LOW (ref 150–440)
RBC: 4.7 10*6/uL (ref 4.40–5.90)
RDW: 18 % — AB (ref 11.5–14.5)
WBC: 3.5 10*3/uL — AB (ref 3.8–10.6)

## 2013-10-28 ENCOUNTER — Emergency Department: Payer: Self-pay | Admitting: Internal Medicine

## 2013-11-06 ENCOUNTER — Emergency Department: Payer: Self-pay | Admitting: Emergency Medicine

## 2013-11-06 LAB — PROTIME-INR
INR: 3.1
Prothrombin Time: 31 secs — ABNORMAL HIGH (ref 11.5–14.7)

## 2013-11-06 LAB — BASIC METABOLIC PANEL
ANION GAP: 7 (ref 7–16)
BUN: 12 mg/dL (ref 7–18)
CALCIUM: 8.7 mg/dL (ref 8.5–10.1)
Chloride: 109 mmol/L — ABNORMAL HIGH (ref 98–107)
Co2: 26 mmol/L (ref 21–32)
Creatinine: 0.94 mg/dL (ref 0.60–1.30)
EGFR (Non-African Amer.): >60
GLUCOSE: 98 mg/dL (ref 65–99)
Osmolality: 283 (ref 275–301)
POTASSIUM: 3.3 mmol/L — AB (ref 3.5–5.1)
Sodium: 142 mmol/L (ref 136–145)

## 2013-11-06 LAB — CBC
HCT: 36.3 % — ABNORMAL LOW (ref 40.0–52.0)
HGB: 11.3 g/dL — ABNORMAL LOW (ref 13.0–18.0)
MCH: 23.5 pg — ABNORMAL LOW (ref 26.0–34.0)
MCHC: 31 g/dL — ABNORMAL LOW (ref 32.0–36.0)
MCV: 76 fL — AB (ref 80–100)
Platelet: 139 10*3/uL — ABNORMAL LOW (ref 150–440)
RBC: 4.8 10*6/uL (ref 4.40–5.90)
RDW: 17.5 % — ABNORMAL HIGH (ref 11.5–14.5)
WBC: 4 10*3/uL (ref 3.8–10.6)

## 2013-11-06 LAB — PRO B NATRIURETIC PEPTIDE: B-Type Natriuretic Peptide: 1687 pg/mL — ABNORMAL HIGH (ref 0–125)

## 2013-11-13 ENCOUNTER — Emergency Department: Payer: Self-pay | Admitting: Emergency Medicine

## 2014-01-08 DIAGNOSIS — M25469 Effusion, unspecified knee: Secondary | ICD-10-CM | POA: Insufficient documentation

## 2014-01-08 DIAGNOSIS — M79609 Pain in unspecified limb: Secondary | ICD-10-CM | POA: Insufficient documentation

## 2014-01-08 DIAGNOSIS — M25569 Pain in unspecified knee: Secondary | ICD-10-CM

## 2014-01-08 DIAGNOSIS — IMO0002 Reserved for concepts with insufficient information to code with codable children: Secondary | ICD-10-CM | POA: Insufficient documentation

## 2014-01-24 ENCOUNTER — Encounter: Payer: Self-pay | Admitting: Neurology

## 2014-02-11 DIAGNOSIS — M1711 Unilateral primary osteoarthritis, right knee: Secondary | ICD-10-CM | POA: Insufficient documentation

## 2014-02-14 ENCOUNTER — Encounter: Payer: Self-pay | Admitting: Neurology

## 2014-02-20 ENCOUNTER — Emergency Department: Payer: Self-pay | Admitting: Student

## 2014-03-05 ENCOUNTER — Emergency Department: Payer: Self-pay | Admitting: Emergency Medicine

## 2014-03-05 LAB — PROTIME-INR
INR: 2.3
PROTHROMBIN TIME: 24.4 s — AB (ref 11.5–14.7)

## 2014-03-17 ENCOUNTER — Encounter: Payer: Self-pay | Admitting: Neurology

## 2014-07-25 ENCOUNTER — Emergency Department: Payer: Self-pay | Admitting: Emergency Medicine

## 2014-09-06 NOTE — Consult Note (Signed)
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   Consult note dictated.   Comments: Plan for IVC filter Monday, pending INR levels.  NPO after midnight.  Electronic Signatures: Serafina Mitchell (MD)  (Signed 28-Dec-14 12:39)  Authored: Brief Consult Note   Last Updated: 28-Dec-14 12:39 by Serafina Mitchell (MD)

## 2014-09-06 NOTE — Discharge Summary (Signed)
PATIENT NAME:  [name], [name] MR#:  [id number] DATE OF BIRTH:  January 20, 1951  DATE OF ADMISSION:  06/05/2012 DATE OF DISCHARGE:  06/06/2012  PRESENTING COMPLAINT: Shortness of breath.   DISCHARGE DIAGNOSES: 1. Acute bilateral pulmonary embolus.  2. Bilateral lower extremity deep vein thrombosis, more on left than right.  3. Hypertension.  4. Tobacco abuse.  5. 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  6. Saturations 96% on room air.   MEDICATIONS: 1. Warfarin 7.5 mg p.o. daily.  2. Enoxaparin 100 mg subcutaneous b.i.d.   3. Metoprolol 25 mg b.i.d.   FOLLOWUP: With Dr. Clayborn Bigness on 06/10/2011 at 11:15 a.m. for PT-INR check and hospital follow-up.   LABORATORY AND RADIOLOGICAL DATA:  Magnesium is 1.8. Lipid profile within normal limits. Cardiac enzymes x3 negative. H and H is 11.8 and 36.3, white count is 4.4.   Echo showed EF of more than 55%. Wall motion is normal. Right ventricular systolic function is normal. No right ventricular strain was noted. Ultrasound Doppler of both lower extremities shows bilateral lower extremity Doppler ultrasound showing bilateral deep vein thrombosis.  CT of the chest showed pulmonary embolism in the segmental pulmonary arteries of the left and right upper lobes. This is age indeterminate. There are findings which may present  from prior pulmonary embolism in right lower lobe and pulmonary artery.   CONSULTATIONS: Dr. Grayland Ormond, [facility], for recurrent PE, and Dr. Lucky Cowboy, Vascular Surgery.  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 Bilateral pulmonary emboli: The patient was started on Lovenox 1 mg/kg b.i.d. and  Coumadin was reintroduced. Bilateral lower extremity ultrasound showed DVT, and the patient has bilateral  acute PE. The patient remained hemodynamically stable. He needs to be on lifelong Coumadin per Oncology recommendation. The patient was seen by Dr. Lucky Cowboy, given the chronicity of DVT, not a candidate for thrombolytic therapy. The patient was discontinued on Lovenox and Coumadin, followed by Dr. Clayborn Bigness for a PT/INR check and Coumadin dose adjustment.  2. Hypertension: Metoprolol was started.  3. Tobacco abuse: The patient was counseled on smoking cessation.  4. 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   The hospital stay otherwise remained stable.    CODE STATUS:  The patient remained a FULL CODE.      TIME SPENT: 40 minutes.   ____________________________ Hart Rochester Posey Pronto, MD sap:cb D: 06/08/2012 14:23:09 ET T: 06/08/2012 15:39:21 ET JOB#: [id number]  cc: Elishia Kaczorowski A. Posey Pronto, MD, <Dictator> Algernon Huxley, MD Kathlene November. Grayland Ormond, MD Heinz Knuckles Blocker, MD Ilda Basset MD ELECTRONICALLY SIGNED 06/09/2012 21:34

## 2014-09-06 NOTE — Discharge Summary (Signed)
PATIENT NAME:  [name], [name] MR#:  [id number] DATE OF BIRTH:  1950-08-20  DATE OF ADMISSION:  05/12/2013 DATE OF DISCHARGE: 05/14/2013   ADMISSION DIAGNOSES:  1.  Bilateral deep vein thromboses.  2.  History of atrial fibrillation.   DISCHARGE DIAGNOSIS: Bilateral deep venous thromboses. The patient has a diffuse non-occlusive right lower extremity deep vein thrombosis in the common femoral, femoral and popliteal and acute deep vein thrombosis extending to the left common femoral vein through the popliteal vein. It is occlusive in the distal popliteal vein and non-occlusive above this.   CONSULTATIONS: Dr. Leotis Pain.   PROCEDURES: The patient underwent an inferior vena cava filter placement on 07/05/2012. Discharge INR is 1.8, discharge sodium 133, potassium 3.9, chloride 103, bicarbonate 26, BUN 13, creatinine 0.88, glucose 101. White blood cell count 5.9, hemoglobin 11, hematocrit 33.7, platelets 145.   HOSPITAL COURSE: A 64 year old male, who presented with lower extremity pain and swelling and was found to have a DVT despite being on Coumadin with a therapeutic INR. For further details, please refer to the H and P.  1.  Bilateral DVT and acute DVT in the left common. The patient was therapeutic on his Coumadin prior to admission; however, he did have a deep vein thrombosis, so therefore vascular surgery was consulted. Dr. Leotis Pain placed an IVC filter on 05/14/2013. The patient will continue with Coumadin therapy. He has had a colonoscopy in the past, which was normal. Other workup should be performed as an outpatient by his PCP. 2.  Hypokalemia, which was repleted.  3.  Leg pain from DVT. The patient will need pain medications at discharge.  4.  History of gout on colchicine.  5.  History of cerebrovascular accident. The patient has baseline aphasia.  6.  History of hypertension. The patient was continued on his outpatient medications.   DISCHARGE MEDICATIONS:  1.  Coumadin 7.5 mg  daily.   2.  Metoprolol 25 mg b.i.d.  3.  Colcrys 0.6 mg b.i.d.  4.  Lasix 20 mg daily. 5.  Simvastatin 20 mg daily.  6.  Acetaminophen/oxycodone 325/5 mg q.4 hours p.r.n. pain, #30.   DISCHARGE HOME HEALTH: With physical therapy nurse. INR monitoring. The patient's INR should be sent to Dr. Ladoris Gene.   DISCHARGE DIET: Low sodium.   DISCHARGE ACTIVITY: As tolerated. No exertional activity until INR is therapeutic.   FOLLOWUP: The patient will follow up with Dr. Ladoris Gene in 1 week. He already has an appointment on January 8. The patient is medically stable for discharge.   TIME SPENT: 35 minutes.  ____________________________ Donell Beers. Benjie Karvonen, MD spm:aw D: 05/14/2013 12:46:56 ET T: 05/14/2013 14:07:37 ET JOB#: [id number]  cc: Zackary Mckeone P. Benjie Karvonen, MD, <Dictator> DR. MANCHENO Dalasia Predmore P Sanay Belmar MD ELECTRONICALLY SIGNED 05/14/2013 15:02

## 2014-09-06 NOTE — H&P (Signed)
PATIENT NAME:  [name], [name] MR#:  [id number] DATE OF BIRTH:  1950/12/07  DATE OF ADMISSION:  05/13/2013  REFERRING PHYSICIAN:  Dr. Karma Greaser.  FAMILY PHYSICIAN:  Nonlocal.   REASON FOR ADMISSION:  Left lower extremity deep vein thrombosis.   HISTORY OF PRESENT ILLNESS:  The patient is a 65 year old male with a history of previous stroke manifested by expressive aphasia two years ago.  Had a right lower extremity DVT one year ago.  Has been on Coumadin.  Presents to the Emergency Room today with left lower extremity pain and swelling.  In the Emergency Room, the patient was noted to be therapeutic on his Coumadin, but did have an ultrasound documented left lower extremity DVT which is new.  He is now admitted for further evaluation.  Denies shortness of breath or hemoptysis.  Does complain of left lower extremity pain and swelling.   PAST MEDICAL HISTORY: 1.  Previous stroke manifested by expressive aphasia.  2.  History of right lower extremity DVT, on anticoagulation.  3.  Benign hypertension.  4.  Gout.   MEDICATIONS: 1.  Lopressor 25 mg by mouth twice daily.  2.  Coumadin 7.5 mg by mouth daily, alternating with 10 mg by mouth daily.  3.  Colcrys 0.6 mg by mouth twice daily.   ALLERGIES:  No known drug allergies.   SOCIAL HISTORY:  The patient does have a history of tobacco abuse, but denies alcohol abuse.   FAMILY HISTORY:  Positive for diabetes and stroke.   REVIEW OF SYSTEMS:  CONSTITUTIONAL:  No fever or change in weight.  EYES:  No blurred or double vision.  No glaucoma.  EARS, NOSE, THROAT:  Denies tinnitus or hearing loss.  No nasal discharge or bleeding.  No difficulty swallowing.  RESPIRATORY:  No cough or wheezing.  Denies hemoptysis.  CARDIOVASCULAR:  No chest pain or orthopnea.  No palpitations or syncope.   GASTROINTESTINAL:  No nausea, vomiting, or diarrhea.  No abdominal pain.  GENITOURINARY:  No dysuria or hematuria.  No incontinence.  ENDOCRINE:  No polyuria  or polydipsia.  No heat or cold intolerance.  HEMATOLOGIC:  The patient denies anemia, easy bruising or bleeding.  LYMPHATIC:  No swollen glands.  MUSCULOSKELETAL:  The patient has pain in his neck, back, shoulders, knees or hips.  Does have gout.  NEUROLOGIC:  No numbness or migraines.  Denies seizures.  PSYCHIATRIC:  The patient denies anxiety, insomnia or depression.   PHYSICAL EXAMINATION: GENERAL:  The patient is in no acute distress.  VITAL SIGNS:  Currently remarkable for a blood pressure of 122/87 with a heart rate of 81, respiratory rate of 20, temperature of 97.8.  HEENT:  Normocephalic, atraumatic.  Pupils equally round, reactive to light and accommodation.  Extraocular movements are intact.  Sclerae are anicteric.  Conjunctivae are clear.  Oropharynx is clear.  NECK:  Supple without JVD.  No adenopathy or thyromegaly is noted.  LUNGS:  Clear to auscultation and percussion without wheezes, rales or rhonchi.  No dullness.  Respiratory effort is normal.  CARDIAC:  Regular rate and rhythm with normal S1, S2.  No significant rubs, murmurs or gallops.  PMI is nondisplaced.  Chest wall is nontender.  ABDOMEN:  Soft, nontender, with normoactive bowel sounds.  No organomegaly or masses were appreciated.  No hernias or bruits were noted.  EXTREMITIES:  Bilateral edema, left greater than right.  Distal pulses were 1+ bilaterally.  SKIN:  Warm and dry without rash or lesions.  NEUROLOGIC:  Revealed  mild expressive aphasia with some right-sided weakness which is chronic.  PSYCHIATRIC:  Revealed a patient who is alert and oriented to person, place, and time.  He was cooperative and used good judgment.   LABORATORY DATA:  The Pro Time was 25.1 with an INR of 2.3.  White count was 4.8 with a hemoglobin of 11.5 and a platelet count of 155,000.  Glucose 98 with a BUN of 15, creatinine 0.98 with a GFR of greater than 60.  Sodium 136 and a potassium of 3.7.  Right lower extremity ultrasound revealed  diffuse nonocclusive right lower extremity DVT in the common femoral and popliteal veins.  Ultrasound of the left lower extremity revealed acute DVT extending from the left common femoral vein through the popliteal vein which was occlusive.   ASSESSMENT: 1.  Acute left lower extremity deep vein thrombosis.  2.  History of right lower extremity deep vein thrombosis.  3.  History of stroke with right-sided weakness and expressive aphasia.  4.  Benign hypertension.  5.  Gout.   PLAN:  The patient will be admitted to the floor on a heparin drip.  We will hold his Coumadin for now.  We will consult vascular surgery for consideration of IVC filter placement.  Once this is accomplished, can resume anticoagulation orally.  We will continue his Lopressor and colchicine at this time.  We will follow daily labs.  Further treatment and evaluation will depend upon the patient's progress.   Total time spent on this patient was 50 minutes.    ____________________________ Leonie Douglas Doy Hutching, MD jds:ea D: 05/12/2013 23:35:51 ET T: 05/13/2013 00:56:18 ET JOB#: [id number]  cc: Leonie Douglas. Doy Hutching, MD, <Dictator> Dewey Neukam Lennice Sites MD ELECTRONICALLY SIGNED 05/13/2013 2:17

## 2014-09-06 NOTE — Consult Note (Signed)
Patient admitted with PE, BLE DVT.  He has had bilateral LE DVT over about 15 years, with a history of poor compliance with Coumadin. Reports no recent leg pain or swelling outside of typical swelling throughout the day which has been present for months to years.  Both legs affected about the same. US shows BLE DVt and describes chronic and more acute appearing components on the left. The right is known as chronic.  His swelling is not terrible and is not really that bothersome to him.  He will have no benefit from thrombolysis due to the chronicity of symptoms.  The role for thrombolysis is really limited to the first 2-3 weeks of an acute DVT, and with his chronic issues bilaterally, thrombolysis would not help.  IVC filter would almost certainly thrombose if he dose not take his coumadin and this would likely make his symptoms much worse, so would not recommend IVC filter.  Would recommend daily compression stockings and leg swelling as well as agreeing with the need for lifelong anticoagulation.  No other recs from a vascular standpoint.    Electronic Signatures: Algernon Huxley (MD)  (Signed on 20-Jan-14 13:47)  Authored  Last Updated: 20-Jan-14 13:47 by Algernon Huxley (MD)

## 2014-09-06 NOTE — H&P (Signed)
PATIENT NAME:  [name], [name] MR#:  [id number] DATE OF BIRTH:  [birth year]  DATE OF ADMISSION:  06/05/2012  PRIMARY CARE PHYSICIAN:  Dr. Clayborn Bigness.  CHIEF COMPLAINT: Chest pain and shortness of breath today.   HISTORY OF PRESENT ILLNESS:  The patient is a pleasant 64 year old African American gentleman with history of left-sided CVA in November  2012, with some residual deficit of expressive aphasia and right upper and lower extremity weakness, history of DVT in the past, who comes in to the Emergency Room after he woke up in the middle of the night complaining of chest pain, sharp, along with some shortness of breath. The patient said he could not get comfortable, called his friend, who called EMS, brought him here. He is hemodynamically stable, and workup in the Emergency Room showed a negative troponin, no EKG changes for acute MRI, and a CT of the chest shows bilateral PE. The patient is being admitted for further evaluation and management. He received a dose of IV subQ Lovenox x1 and aspirin. He also received some nitroglycerin sublingual. The patient is chest pain free at this time. His sats are 100% on 2 liters. His blood pressure 161/98. The patient is being admitted for further evaluation on his PE.   PAST MEDICAL HISTORY:   1.  Right lower extremity DVT in 2006. The patient was on Coumadin, taken off of it.  2.  History of hypertension, noncompliant with meds.  3.  History of diabetes. Sugars are well controlled.  4.  History of tobacco abuse.  5.  History of gout.   MEDICATIONS:  Indomethacin 25 mg 1 tablet 3 times a day.    SOCIAL HISTORY: He lives in a boarding home. Smokes about a pack a day for many years. Denies any alcohol or any other drug use.   PAST SURGICAL HISTORY: None.   ALLERGIES: NO KNOWN DRUG ALLERGIES.   FAMILY HISTORY: According to old records, no heart disease, diabetes or hypertension.     REVIEW OF SYSTEMS: CONSTITUTIONAL:  No fever, fatigue, weakness.   EYES:  No blurred or double vision. No glaucoma.  EAR, NOSE, THROAT:  No tinnitus, ear pain, hearing loss.  RESPIRATORY:  No cough. Positive for shortness of breath. No COPD.  CARDIOVASCULAR: Positive for chest pain and shortness of breath. Positive for hypertension.  GASTROINTESTINAL:   No nausea, vomiting, diarrhea, or abdominal pain.  GENITOURINARY:  No dysuria or hematuria.  ENDOCRINE:  No polyuria or nocturia or thyroid problems.  HEMATOLOGY:  No anemia or easy bruising.  SKIN:  No acne or rash.  MUSCULOSKELETAL:  Positive for arthritis.  NEUROLOGIC:  Positive for CVA in the past with some residual weakness in the right.  PSYCHIATRIC:  No anxiety or depression. All other systems reviewed and negative.   PHYSICAL EXAMINATION: GENERAL:  The patient is awake, alert, oriented x 3, not in acute distress.  VITAL SIGNS:  He is afebrile. Pulse is 66, regular. Blood pressure is 166/98. Sats are 98% on 2 liters.  HEAD, EYE, EAR, NOSE, THROAT:  Atraumatic, normocephalic. PERRLA. EOM intact. Oral mucosa is moist.  NECK:  Supple. No JVD. No carotid bruit.  LUNGS:  Clear to auscultation bilaterally. No rales, rhonchi, respiratory distress or labored breathing.  CARDIOVASCULAR:  Both of the heart sounds are normal. Rate, rhythm is regular. PMI not lateralized. Chest is nontender.  EXTREMITIES:  Good pedal pulses, good femoral pulses. No lower extremity edema.  ABDOMEN:  Soft, benign and nontender. No organomegaly. Positive bowel sounds.  NEUROLOGIC:  Grossly intact cranial nerves II at XII. The patient has mild expressive dysarthria. Right-sided chronic upper and lower extremity weakness due to previous stroke.  Plantars are downgoing. Reflexes 1+ on both upper and lower extremity. Gait not tested.  SKIN: Warm and dry.  PSYCHIATRIC:  The patient is awake, alert, oriented x 3.   LABORATORY DATA:  PT/INR is 1.0 and 13.6. CBC: Within normal limits, except MCV of 76, H and H is 11.8 and 36.3. Metabolic  panel within normal limits. Troponin is 0.02.   EKG: Shows sinus rhythm with first degree AV block.   IMAGING DATA:  CT of the chest shows pulmonary emboli in the segmental pulmonary arteries of the left and right lower lobes. These are age indeterminate. These findings may represent pulmonary embolus in the right lower pulmonary arteries. Opacities in the periphery of the right upper lobe are likely secondary to scarring.   ASSESSMENT:  A 64 year old patient with history of hypertension and cerebrovascular accident with residual right upper and lower extremity weakness and expressive aphasia and with mild dysarthria, comes in with:  1.  Bilateral pulmonary embolisms. The patient will be admitted on telemetry floor, 2 gram sodium diet. He has received a dose of Lovenox in the Emergency Room. We will continue Lovenox 1 mg/kg b.i.d. and add Coumadin for anticoagulation.  Pulmonary was curb-sided. CT was reviewed again, given the indeterminate age, indeterminate pulmonary embolism; however, we are going to be treating this as acute pulmonary embolism given symptoms of pleuritic chest pain and shortness of breath with mild tachycardia on admission. The patient is hemodynamically stable at this time. We will consider pulmonary consultation if needed.  We  will also get a Doppler ultrasound of the lower extremities. The patient has a history of right lower extremity deep vein thrombosis in the past and was on Coumadin for it.  2.  Hypertension, mildly elevated. The patient is noncompliant with medication. We will start beta blockers.  3.  Tobacco abuse. The patient was counseled on smoking cessation, about 3 to 4 minutes' worth were spent counseling. The patient does voice understanding, and is going to work with smoking cessation.  4.  History of left cerebrovascular accident with right-sided weakness and mild dysarthria.  The patient will be on Lovenox and Coumadin. We will continue to monitor. These are  chronic changes from his previous cerebrovascular accident in November 2012.  5.  Deep vein thrombosis prophylaxis. The patient is on Coumadin and Lovenox.  6.  Care management for discharge planning.   No family members present. Further workup will depend on the patient's clinical course. Hospital admission plan was discussed with the patient.   TIME SPENT: 50 minutes.    ____________________________ Hart Rochester Posey Pronto, MD sap:dm D: 06/05/2012 09:25:00 ET T: 06/05/2012 09:53:03 ET JOB#: [id number]  cc: Czar Ysaguirre A. Posey Pronto, MD, <Dictator> [place] Blocker, MD Ilda Basset MD ELECTRONICALLY SIGNED 06/09/2012 21:33

## 2014-09-06 NOTE — Consult Note (Signed)
History of Present Illness:   Reason for Consult Bilateral PE, extensive history of DVT.    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  Patient has been very noncompliant with his Coumadin and states he not taking any in over a year.  He presents again today with pleuritic chest pain shortness of breath as well as bilateral DVT.  Currently, he feels well.  He is no longer complaining of chest pain.  He has no neurologic complaints.  He has a good appetite and denies weight loss.  He denies any fevers.  He has no nausea, vomiting, constipation, or diarrhea.  He has no urinary complaints.  Patient otherwise feels well and offers no further specific complaints.  PFSH:   Additional Past Medical and Surgical History Past medical history: Multiple DVTs, Hypertension, diabetes.  Past surgical history: Negative.  Family history: Negative and noncontributory.  No report of clotting.  Social history:  Heavy tobacco use, no report of alcohol.  Previously heavy cocaine and crack use.   Review of Systems:   Performance Status (ECOG) 1    Review of Systems   As per HPI. Otherwise, 10 point system review was negative.   Physical Exam:   Physical Exam General: Well-developed, well-nourished, no acute distress. Eyes: Pink conjunctiva, anicteric sclera. HEENT: Normocephalic, moist mucous membranes, clear oropharnyx. Lungs: Clear to auscultation bilaterally. Heart: Regular rate and rhythm. No rubs, murmurs, or gallops. Abdomen: Soft, nontender, nondistended. No organomegaly noted, normoactive bowel sounds. Musculoskeletal: No edema, cyanosis, or clubbing. Neuro: Alert, answering all questions appropriately. Cranial nerves grossly intact. Skin: No rashes or petechiae noted. Psych: Normal affect. Lymphatics: No cervical, calvicular, axillary or inguinal LAD.    No Known Allergies:     indomethacin 25 mg oral capsule: 1 cap(s)  orally 3 times a day, Active, 0, None  Laboratory Results: Routine Chem:  20-Jan-14 04:04    Iron Binding Capacity (TIBC) 338   Unbound Iron Binding Capacity 291   Iron, Serum  47   Iron Saturation 14 (Result(s) reported on 05 Jun 2012 at 09:44AM.)   Glucose, Serum  100   BUN 14   Creatinine (comp) 0.89   Sodium, Serum 141   Potassium, Serum 3.9   Chloride, Serum 107   CO2, Serum 27   Calcium (Total), Serum 9.0   Anion Gap 7   Osmolality (calc) 282   eGFR (African American) >60   eGFR (Non-African American) >60 (eGFR values <78m/min/1.73 m2 may be an indication of chronic kidney disease (CKD). Calculated eGFR is useful in patients with stable renal function. The eGFR calculation will not be reliable in acutely ill patients when serum creatinine is changing rapidly. It is not useful in  patients on dialysis. The eGFR calculation may not be applicable to patients at the low and high extremes of body sizes, pregnant women, and vegetarians.)  Cardiac:  20-Jan-14 04:04    Troponin I < 0.02 (0.00-0.05 0.05 ng/mL or less: NEGATIVE  Repeat testing in 3-6 hrs  if clinically indicated. >0.05 ng/mL: POTENTIAL  MYOCARDIAL INJURY. Repeat  testing in 3-6 hrs if  clinically indicated. NOTE: An increase or decrease  of 30% or more on serial  testing suggests a  clinically important change)   CK, Total 196   CPK-MB, Serum 2.0 (Result(s) reported on 05 Jun 2012 at 04:30AM.)  Routine Hem:  20-Jan-14 04:04    WBC (CBC) 4.4   RBC (CBC)  4.80   Hemoglobin (CBC)  11.8   Hematocrit (CBC)  36.3   Platelet Count (CBC) 157 (Result(s) reported on 05 Jun 2012 at 04:17AM.)   MCV  76   MCH  24.7   MCHC 32.6   RDW  17.5   Assessment and Plan:  Impression:   Multiple DVTs and PE.  Plan:   1.  Multiple DVTs and PE: Patient has been instructed on multiple occasions that he requires lifelong Coumadin and has been admittedly noncompliant.  He states he has not had Coumadin in over a year.  I  again stressed the importance of taking his Coumadin daily which he will require for the rest of his life.  There is no need to do a full hypercoagulable workup at this time.  Recommend goal INR of 2.0-3.0.  Have also instructed him to reestablish contact with his primary care physician Dr. Clayborn Bigness.  No followup is needed in the [facility]. consult, call with questions.  Electronic Signatures: Delight Hoh (MD)  (Signed 20-Jan-14 12:24)  Authored: HISTORY OF PRESENT ILLNESS, PFSH, ROS, PE, ALLERGIES, HOME MEDICATIONS, LABS, ASSESSMENT AND PLAN   Last Updated: 20-Jan-14 12:24 by Delight Hoh (MD)

## 2014-09-07 NOTE — Op Note (Signed)
PATIENT NAME:  [name], [name] MR#:  [id number] DATE OF BIRTH:  Aug 07, 1950  DATE OF PROCEDURE:  05/14/2013  PREOPERATIVE DIAGNOSIS: Deep vein thrombosis while on anticoagulation and therapeutic representing failure of anticoagulation.   POSTOPERATIVE DIAGNOSIS: Deep vein thrombosis while on anticoagulation and therapeutic representing failure of anticoagulation.   PROCEDURES: 1.  Ultrasound guidance for vascular access, right femoral vein.  2.  Catheter placement into inferior vena cava.  3.  Inferior venacavogram.  4.  Placement of an inferior vena cava filter.   SURGEON: Algernon Huxley, M.D.   ANESTHESIA: Local with Versed.   ESTIMATED BLOOD LOSS: Minimal.   FLUOROSCOPY TIME: Less than 1 minute.   CONTRAST USED: 15 mL.   INDICATION FOR PROCEDURE: This is a 64 year old African American male who presents with a new deep venous thrombosis while therapeutic on anticoagulation for previous DVT. For this reason, we are asked to place an IVC filter. Risks and benefits were discussed. Informed consent was obtained.   DESCRIPTION OF PROCEDURE: The patient is brought to the vascular and interventional radiology suite. The groins were shaved and prepped and a sterile surgical field was created. The right femoral vein was visualized with ultrasound and found to be widely patent. It was then accessed under direct ultrasound guidance without difficulty with a Seldinger needle and a J-wire was placed. After skin nick and dilatation, the delivery sheath was placed into the inferior vena cava and an inferior venacavogram was performed. This demonstrated a patent vena cava. The level of the renal veins was at L1, and the filter was then deployed at L2, in good orientation. At this point, I elected to terminate the procedure. The delivery sheath was removed. Pressure was held. Sterile dressing was placed. The patient tolerated the procedure well and was taken to the recovery room in stable condition.   ____________________________ Algernon Huxley, MD jsd:sb D: 05/14/2013 10:52:49 ET T: 05/14/2013 11:47:04 ET JOB#: [id number]  cc: Algernon Huxley, MD, <Dictator> Algernon Huxley MD ELECTRONICALLY SIGNED 05/24/2013 13:35

## 2014-09-07 NOTE — Consult Note (Signed)
PATIENT NAME:  [name], SCIPIO MR#:  [id number] DATE OF BIRTH:  [birth year]  DATE OF CONSULTATION:  05/13/2013  REFERRING PHYSICIAN:  Dr. Karma Greaser CONSULTING PHYSICIAN:  Serafina Mitchell, MD  REASON FOR CONSULTATION:  Left lower extremity DVT.   HISTORY:  This is a 64 year old African American gentleman, who has a history of a left brain stroke approximately 2 years ago.  His residual symptoms include expressive aphasia and right-sided weakness.  Approximately one year ago, he had a right lower extremity DVT.  The patient reports that he also clots to his lungs.  He has been maintained on Coumadin.  He presented to the Emergency Department with complaints of left lower extremity pain and swelling for 3 or 4 days.  Ultrasound in the Emergency Department revealed a left femoral/popliteal deep vein thrombosis, which was acute. His INR in the Emergency Room was 2.3.  He was admitted for further evaluation. The patient is very compliant with his medications.  He is managed for hypertension with Lasix.  His beta blocker has recently been discontinued.  His hypercholesterolemia is managed with a statin. He is on Coumadin for a history of right leg DVT.  He no longer takes an aspirin.    PAST MEDICAL HISTORY:   1.  History of stroke with right-sided weakness and expressive aphasia.  2.  History of right leg DVT, on chronic Coumadin.  3.  Benign hypertension.  4.  Gout.  5.  Hypercholesterolemia.   MEDICATIONS: 1.   Lopressor 25 mg b.i.d., which the patient states he does not take any longer.  2.   Coumadin 7.5 mg daily, alternating with 10 mg daily.  3.  Colcrys 0.6 mg twice daily.  4.  Lasix  5.  Statin.   ALLERGIES:  No known drug allergies.   SOCIAL HISTORY:  The patient has a history of tobacco abuse, but denies alcohol.    FAMILY HISTORY:  Positive for diabetes and stroke.    REVIEW OF SYSTEMS:  Please see review of systems from the admission history and physical. There have been no  interval changes.    PHYSICAL EXAMINATION: VITAL SIGNS:  Afebrile, hemodynamically stable.  GENERAL:  The patient is in no acute distress.  HEENT:  Normocephalic, atraumatic, pupils are equal.  LUNGS:  Respirations are nonlabored.  CARDIOVASCULAR:  Regular rate and rhythm.  The patient has palpable pedal pulses.  ABDOMEN:  Soft, nontender.   EXTREMITIES:  Bilateral edema, left greater than right with left calf tenderness.  NEUROLOGIC:  Right-sided weakness upper and lower extremities, expressive aphasia, normal left-sided strength.  SKIN:  Warm without rashes.  PSYCHIATRIC:  He is alert and oriented.   LABORATORY, DIAGNOSTIC AND RADIOLOGIC DATA:  INR was 2.3 in the Emergency Department and 2.4 today.   Ultrasound:  Acute left leg DVT.    ASSESSMENT:  Acute left leg deep venous thrombosis on therapeutic Coumadin.  The patient has a history of a right left deep venous thrombosis.  He has been compliant with Coumadin therapy.  He comes into the Emergency Department with an acute left leg deep venous thrombosis and therapeutic levels of his Coumadin. The patient does report having a pulmonary embolus in the past.  Due to concerns over possible deep venous thrombosis, in addition to the fact that he developed an acute left leg deep venous thrombosis while on therapeutic Coumadin, an IVC filter has been requested.  I think this is very reasonable given his recent clinical course.  I discussed the details of  the procedure with the patient and his wife.  He understands that this most likely will be a permanent filter.  The current plan is for placement of an IVC filter Monday morning.  This will depend on his Coumadin level, as his INR did increase to 2.4 today despite holding his Coumadin last night. In the interval, he will be maintained on IV heparin.        ____________________________ Serafina Mitchell, MD vwb:cc D: 05/13/2013 20:33:50 ET T: 05/14/2013 00:25:53 ET JOB#: [id number]  cc: Serafina Mitchell, MD, <Dictator> Serafina Mitchell MD ELECTRONICALLY SIGNED 07/03/2013 14:38

## 2014-09-08 NOTE — H&P (Signed)
PATIENT NAME:  [name], [name] MR#:  [id number] DATE OF BIRTH:  [birth year]  DATE OF ADMISSION:  04/21/2011  PRIMARY CARE PHYSICIAN: Dr. Clayborn Bigness  HISTORY OF PRESENT ILLNESS: Patient is a 64 year old African American male with medical history significant for history of deep vein thrombosis in right lower extremity in the past, history of hypertension, diabetes mellitus diet controlled as well as tobacco as well as cocaine abuse presented to the hospital after he was noted to have right-sided weakness. Apparently patient was okay at around 2:00 a.m. on day of admission. He called his girlfriend to wish her goodnight, however, he was found at home by EMS with right-sided weakness. He had right facial droop as well as weakness in his right upper extremity. He was also unable to speak. EMS brought him to Emergency Room and hospitalist services were contacted for admission. His CT head was suggestive for left frontal lobe attenuation stroke.   PAST MEDICAL HISTORY:  1. History of right lower extremity deep vein thrombosis diagnosed in 2006. 2. History of Coumadin use in the past, apparently now patient is claiming that he is on Coumadin at this time. It is unclear why this medication is being given to him. 3. History of hypertension. 4. History of diabetes mellitus diagnosed in 2006. Patient, however, makes surprised look as if he never heard about diabetes.  5. No history of hyperlipidemia. 6. History of alcohol, tobacco abuse as well as cocaine abuse.  MEDICATIONS: He told me that he does not take any, however, pharmacist came in and brought his home medication list.  1. Coumadin 5 mg p.o. daily.  2. Ibuprofen 200 mg 2 tablets daily as needed.   PAST SURGICAL HISTORY: None.   ALLERGIES: No known drug allergies.   SOCIAL HISTORY: He used to smoke one pack per day for 50 years, has been smoking for 50 years; now according to patient's girlfriend he smokes 1 pack in two days. He used to drink  two beers a day. He is divorced. He has three children. He has a girlfriend.   FAMILY HISTORY: No history of blood clots. Denies any heart disease, diabetes mellitus or hypertension according to medical history. Patient himself is not able to provide much more history.   REVIEW OF SYSTEMS: Difficult to obtain as patient is not able to speak. He has aphasia as well aphonia, however, asking him he admits of some blurring of vision. He admits of having some shortness of breath as well as chest pain and is not able to show me exactly where it is. He seemed to be also tearful and emotional here in the Emergency Room. Denies any double vision. Denies any nausea, vomiting. Denies any constipation or diarrhea. Denies any urination troubles. Admits right-sided weakness, right facial weakness. Denies any dysphagia. Denies any choking or having problems with swallowing.   PHYSICAL EXAMINATION: VITAL SIGNS: On arrival to Emergency Room: Temperature 99.1, pulse 76, respiration rate 18, blood pressure 194/107, , saturation 98% on room air.   GENERAL: This is a well-developed, well-nourished African American male in no significant distress laying on the stretcher.   HEENT: His pupils are equal, reactive to light. Extraocular movements somewhat intact, however, patient does have difficulty moving or tracking with his eyes towards the right side. He seemed to be having some right neglect.    NECK: Neck did not reveal any masses, supple, nontender. Thyroid not enlarged. No adenopathy. No JVD or carotid bruits bilaterally. Full range of motion.    LUNGS:  Clear to auscultation in all fields. No rales, rhonchi, diminished breath sounds or wheezing. No labored inspirations, increased effort, dullness to percussion, overt respiratory distress.   CARDIOVASCULAR: S1, S2 appreciated. No murmurs, gallops, rubs noted. PMI not lateralized. Chest is nontender to palpation.   EXTREMITIES: 1+ pedal pulses. No lower extremity  edema, calf tenderness, or cyanosis noted.   ABDOMEN: Soft, nontender. Bowel sounds are present. No hepatosplenomegaly or masses are noted.   RECTAL: Deferred.   MUSCULOSKELETAL: Able to move his left side with no significant problems. His right upper extremity is weak, 3/5 weakness. He is not able to squeeze or grasp with his hand on the right side at all. He has 4/5 weakness in his right lower extremity. He has right facial weakness . He has right tongue deviation.   SKIN: Skin did not reveal any rashes, lesions, erythema, nodularity, induration. It was warm and dry to palpation.   LYMPH: No adenopathy in cervical region.   NEUROLOGICAL: As mentioned above right facial weakness. Patient does have aphagia and aphonia. Patient is alert, cooperative, however, has difficulty complying with questions. Not able to assess his memory. He seemed to be very emotional, not able to provide history or to cooperate with evaluation.   LABORATORY, DIAGNOSTIC AND RADIOLOGICAL DATA: BMP within normal limits. Patient's troponin less than 0.02. CBC: White blood cell count 5.1, hemoglobin 11.4, platelets 201. Coagulation panel unremarkable with pro time 14.1, INR 1.1, activated PTT 32.8. EKG showed normal sinus rhythm at 75 beats per minute, normal axis, no acute ST-T changes. CT scan of head without contrast 04/21/2011 revealed subtle low attenuation area in left frontal lobe concerning for acute infarct.   ASSESSMENT AND PLAN:  1. Cerebrovascular accident. Admit patient to medical floor and telemetry. Check echocardiogram as well as carotid ultrasound. Get MRI of brain to evaluate extend of patient's brain damage. Get physical therapy consultation as well as speech therapist to evaluate patient. Patient will be n.p.o. for now. Will continue aspirin per rectum for now and will follow his progress.  2. Malignant hypertension. Start patient on nitroglycerin topically. Get urine drug screen. 3. Chest pain. As  mentioned above will continue aspirin therapy as well as nitroglycerin. Urine drug screen will be checked. Will get cardiac enzymes x3 as well as echocardiogram.  4. Anemia. Get guaiac. Coagulation panel seemed to be unremarkable; doesn't  seem to be taking any Coumadin.  5. Tobacco abuse. Will start nicotine replacement; that was discussed with patient for approximately three minutes.  6. Diabetes mellitus. Will check hemoglobin A1c. Will continue patient on sliding scale insulin.  7. History of cocaine abuse. Will check urine drug screen. 8. Patient is overweight. Get lipid panel as mentioned above as well as echocardiogram, hemoglobin A1c. Get also TSH.   TIME SPENT: One hour.   ____________________________ Theodoro Grist, MD rv:cms D: 04/21/2011 19:36:38 ET T: 04/22/2011 06:01:04 ET JOB#: [id number]  cc: Theodoro Grist, MD, <Dictator> Lavera Guise, MD [place] MD ELECTRONICALLY SIGNED 06/13/2011 9:44

## 2014-10-11 ENCOUNTER — Emergency Department
Admission: EM | Admit: 2014-10-11 | Discharge: 2014-10-11 | Disposition: A | Discharge status: Home / Self Care | Payer: Medicare Other | Source: Home / Self Care | Attending: Student | Admitting: Student

## 2014-10-11 ENCOUNTER — Other Ambulatory Visit: Payer: Self-pay

## 2014-10-11 ENCOUNTER — Encounter: Payer: Self-pay | Admitting: Emergency Medicine

## 2014-10-11 ENCOUNTER — Emergency Department: Payer: Medicare Other

## 2014-10-11 DIAGNOSIS — Z720 Tobacco use: Secondary | ICD-10-CM

## 2014-10-11 DIAGNOSIS — Z7901 Long term (current) use of anticoagulants: Secondary | ICD-10-CM | POA: Insufficient documentation

## 2014-10-11 DIAGNOSIS — E119 Type 2 diabetes mellitus without complications: Secondary | ICD-10-CM | POA: Insufficient documentation

## 2014-10-11 DIAGNOSIS — J189 Pneumonia, unspecified organism: Secondary | ICD-10-CM | POA: Diagnosis not present

## 2014-10-11 DIAGNOSIS — R059 Cough, unspecified: Secondary | ICD-10-CM

## 2014-10-11 DIAGNOSIS — I10 Essential (primary) hypertension: Secondary | ICD-10-CM | POA: Diagnosis present

## 2014-10-11 DIAGNOSIS — R05 Cough: Secondary | ICD-10-CM

## 2014-10-11 DIAGNOSIS — R042 Hemoptysis: Secondary | ICD-10-CM | POA: Diagnosis present

## 2014-10-11 DIAGNOSIS — I69391 Dysphagia following cerebral infarction: Secondary | ICD-10-CM

## 2014-10-11 DIAGNOSIS — J159 Unspecified bacterial pneumonia: Secondary | ICD-10-CM | POA: Insufficient documentation

## 2014-10-11 DIAGNOSIS — Z792 Long term (current) use of antibiotics: Secondary | ICD-10-CM | POA: Insufficient documentation

## 2014-10-11 DIAGNOSIS — Z95820 Peripheral vascular angioplasty status with implants and grafts: Secondary | ICD-10-CM

## 2014-10-11 DIAGNOSIS — M109 Gout, unspecified: Secondary | ICD-10-CM | POA: Diagnosis present

## 2014-10-11 DIAGNOSIS — I480 Paroxysmal atrial fibrillation: Secondary | ICD-10-CM | POA: Diagnosis present

## 2014-10-11 DIAGNOSIS — Z86718 Personal history of other venous thrombosis and embolism: Secondary | ICD-10-CM

## 2014-10-11 DIAGNOSIS — F17210 Nicotine dependence, cigarettes, uncomplicated: Secondary | ICD-10-CM | POA: Diagnosis present

## 2014-10-11 DIAGNOSIS — Z86711 Personal history of pulmonary embolism: Secondary | ICD-10-CM

## 2014-10-11 DIAGNOSIS — R1310 Dysphagia, unspecified: Secondary | ICD-10-CM | POA: Diagnosis present

## 2014-10-11 DIAGNOSIS — Z79899 Other long term (current) drug therapy: Secondary | ICD-10-CM

## 2014-10-11 LAB — CBC
HCT: 36.5 % — ABNORMAL LOW (ref 40.0–52.0)
Hemoglobin: 11.6 g/dL — ABNORMAL LOW (ref 13.0–18.0)
MCH: 23.9 pg — AB (ref 26.0–34.0)
MCHC: 31.8 g/dL — AB (ref 32.0–36.0)
MCV: 75.3 fL — ABNORMAL LOW (ref 80.0–100.0)
PLATELETS: 120 10*3/uL — AB (ref 150–440)
RBC: 4.85 MIL/uL (ref 4.40–5.90)
RDW: 16.9 % — ABNORMAL HIGH (ref 11.5–14.5)
WBC: 6.8 10*3/uL (ref 3.8–10.6)

## 2014-10-11 LAB — URINALYSIS COMPLETE WITH MICROSCOPIC (ARMC ONLY)
BACTERIA UA: NONE SEEN
Bilirubin Urine: NEGATIVE
Glucose, UA: NEGATIVE mg/dL
Hgb urine dipstick: NEGATIVE
Ketones, ur: NEGATIVE mg/dL
LEUKOCYTES UA: NEGATIVE
NITRITE: NEGATIVE
PH: 7 (ref 5.0–8.0)
Specific Gravity, Urine: 1.018 (ref 1.005–1.030)

## 2014-10-11 LAB — COMPREHENSIVE METABOLIC PANEL
ALK PHOS: 86 U/L (ref 38–126)
ALT: 42 U/L (ref 17–63)
AST: 55 U/L — AB (ref 15–41)
Albumin: 3.4 g/dL — ABNORMAL LOW (ref 3.5–5.0)
Anion gap: 9 (ref 5–15)
BUN: 12 mg/dL (ref 6–20)
CO2: 25 mmol/L (ref 22–32)
CREATININE: 0.94 mg/dL (ref 0.61–1.24)
Calcium: 8.3 mg/dL — ABNORMAL LOW (ref 8.9–10.3)
Chloride: 99 mmol/L — ABNORMAL LOW (ref 101–111)
GFR calc Af Amer: >60 mL/min (ref >60)
GFR calc non Af Amer: >60 mL/min (ref >60)
Glucose, Bld: 108 mg/dL — ABNORMAL HIGH (ref 65–99)
Potassium: 3.6 mmol/L (ref 3.5–5.1)
Sodium: 133 mmol/L — ABNORMAL LOW (ref 135–145)
Total Bilirubin: 1.3 mg/dL — ABNORMAL HIGH (ref 0.3–1.2)
Total Protein: 7.4 g/dL (ref 6.5–8.1)

## 2014-10-11 LAB — TROPONIN I: Troponin I: <0.03 ng/mL (ref <0.031)

## 2014-10-11 LAB — APTT: APTT: 39 s — AB (ref 24–36)

## 2014-10-11 LAB — PROTIME-INR
INR: 1.63
Prothrombin Time: 19.5 seconds — ABNORMAL HIGH (ref 11.4–15.0)

## 2014-10-11 MED ORDER — AZITHROMYCIN 250 MG PO TABS: ORAL_TABLET | ORAL | Status: DC

## 2014-10-11 MED ORDER — DEXTROSE 5 % IV SOLN
500.0000 mg | Freq: Once | INTRAVENOUS | Status: AC
  Administered 2014-10-11: 500 mg via INTRAVENOUS

## 2014-10-11 MED ORDER — SODIUM CHLORIDE 0.9 % IV SOLN
Freq: Once | INTRAVENOUS | Status: AC
  Administered 2014-10-11: 10:00:00 via INTRAVENOUS

## 2014-10-11 MED ORDER — DEXTROSE 5 % IV SOLN
INTRAVENOUS | Status: AC
  Filled 2014-10-11: qty 500

## 2014-10-11 MED ORDER — WARFARIN SODIUM 5 MG PO TABS
2.5000 mg | ORAL_TABLET | Freq: Once | ORAL | Status: AC
  Administered 2014-10-11: 2.5 mg via ORAL

## 2014-10-11 MED ORDER — WARFARIN SODIUM 5 MG PO TABS
ORAL_TABLET | ORAL | Status: AC
  Filled 2014-10-11: qty 1

## 2014-10-11 NOTE — ED Notes (Signed)
Patient states he developed weakness approx 2 weeks ago with cold symptoms. Was placed on antibiotic by PCP. States he has no energy now and still feels bad.

## 2014-10-11 NOTE — ED Notes (Signed)
Patient transported to X-ray 

## 2014-10-11 NOTE — Discharge Instructions (Signed)
Return immediately for worsening cough or difficulty breathing, chest pain, weakness, persistent fevers, vomiting, abdominal pain, lightheadedness, fainting, or for any other concerns.

## 2014-10-11 NOTE — ED Provider Notes (Signed)
[facility] Emergency Department Provider Note  ____________________________________________  Time seen: Approximately 10:07 AM  I have reviewed the triage vital signs and the nursing notes.   HISTORY  Chief Complaint Weakness    HPI Giann Obara is a 64 y.o. 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 Patient has had productive cough for 2 weeks. He was started on Augmentin by his primary care doctor on the 18th of this month however he has not had any symptomatic improvement. He continues to feel weak, somewhat short of breath, has had chills at home. No nausea, vomiting, diarrhea. No chest pain. No hemoptysis. No modifying factors.   Past Medical History  Diagnosis Date  . History of deep vein thrombosis 2006  . Hypertension   . Gout current and history of  . History of tobacco abuse     has smoked for 50 years  . Cocaine abuse     + UDS on admission  . Stroke     Patient Active Problem List   Diagnosis Date Noted  . CVA (cerebral infarction) 04/26/2011  . Diabetes mellitus 04/26/2011  . 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 Anemia 04/26/2011  . DVT (deep venous thrombosis) 04/26/2011    Past Surgical History  Procedure Laterality Date  . Cardiac surgery      Stent    Current Outpatient Rx  Name  Route  Sig  Dispense  Refill  . amoxicillin-clavulanate (AUGMENTIN) 875-125 MG per tablet   Oral   Take 1 tablet by mouth 2 (two) times daily. For 10 days         . colchicine 0.6 MG tablet   Oral   Take 0.6 mg by mouth daily.         . metoprolol tartrate (LOPRESSOR) 25 MG tablet   Oral   Take 25 mg by mouth 2 (two) times daily.         [name] warfarin (COUMADIN) 6 MG tablet   Oral   Take 6 mg by mouth daily. Every evening at 5 pm           Allergies Review of patient's allergies indicates no  known allergies.  Family History  Problem Relation Age of Onset  . Aneurysm Mother   . Diabetes Father     Social History History  Substance Use Topics  . Smoking status: Current Every Day Smoker -- 0.50 packs/day for 50 years    Types: Cigarettes  . Smokeless tobacco: Never Used  . Alcohol Use: No    Review of Systems Constitutional: + subjective fever/chills Eyes: No visual changes. ENT: No sore throat. Cardiovascular: Denies chest pain. Respiratory: + shortness of breath. Gastrointestinal: No abdominal pain.  No nausea, no vomiting.  No diarrhea.  No constipation. Genitourinary: Negative for dysuria. Musculoskeletal: Negative for back pain. Skin: Negative for rash. Neurological: Negative for headaches, focal weakness or numbness.  10-point ROS otherwise negative.  ____________________________________________   PHYSICAL EXAM:  VITAL SIGNS: ED Triage Vitals  Enc Vitals Group     BP 10/11/14 0840 164/88 mmHg     Pulse Rate 10/11/14 0840 71     Resp 10/11/14 0840 18     Temp 10/11/14 0840 97.7 F (36.5 C)     Temp Source 10/11/14 0840 Oral     SpO2 10/11/14 0840 98 %     Weight 10/11/14 0840 238 lb (107.956 kg)  Height 10/11/14 0840 '6\' 2"'$  (1.88 m)     Head Cir --      Peak Flow --      Pain Score 10/11/14 0841 8     Pain Loc --      Pain Edu? --      Excl. in [place]? --     Constitutional: Alert and oriented. Fatigued but nontoxic appearing and in no acute distress; frequent wet-sounding cough Eyes: Conjunctivae are normal. PERRL. EOMI. Head: Atraumatic. Nose: No congestion/rhinnorhea. Mouth/Throat: Mucous membranes are moist.  Oropharynx non-erythematous. Neck: No stridor.  Cardiovascular: Normal rate, regular rhythm. Grossly normal heart sounds.  Good peripheral circulation. Respiratory: Normal respiratory effort.  No retractions. Diminished breath sounds with crackles in the right base Gastrointestinal: Soft and nontender. No distention. No abdominal  bruits. No CVA tenderness. Genitourinary: deferred Musculoskeletal: No lower extremity tenderness nor edema.  No joint effusions. Neurologic:  Normal speech and language. Mild right lower extremity weakness which patient reports is chronic, neurological exam is otherwise intact Skin:  Skin is warm, dry and intact. No rash noted. Psychiatric: Mood and affect are normal. Speech and behavior are normal.  ____________________________________________   LABS (all labs ordered are listed, but only abnormal results are displayed)  Labs Reviewed  CBC - Abnormal; Notable for the following:    Hemoglobin 11.6 (*)    HCT 36.5 (*)    MCV 75.3 (*)    MCH 23.9 (*)    MCHC 31.8 (*)    RDW 16.9 (*)    Platelets 120 (*)    All other components within normal limits  COMPREHENSIVE METABOLIC PANEL - Abnormal; Notable for the following:    Sodium 133 (*)    Chloride 99 (*)    Glucose, Bld 108 (*)    Calcium 8.3 (*)    Albumin 3.4 (*)    AST 55 (*)    Total Bilirubin 1.3 (*)    All other components within normal limits  URINALYSIS COMPLETEWITH MICROSCOPIC ([facility] ONLY) - Abnormal; Notable for the following:    Color, Urine YELLOW (*)    APPearance CLEAR (*)    Protein, ur >500 (*)    Squamous Epithelial / LPF 0-5 (*)    All other components within normal limits  PROTIME-INR - Abnormal; Notable for the following:    Prothrombin Time 19.5 (*)    All other components within normal limits  APTT - Abnormal; Notable for the following:    aPTT 39 (*)    All other components within normal limits  TROPONIN I   ____________________________________________  EKG  ED ECG REPORT I, Joanne Gavel, the attending physician, personally viewed and interpreted this ECG.   Date: 10/11/2014  EKG Time: 09:27  Rate: 73  Rhythm: atrial fibrillation, rate 73  Axis: normal  Intervals:normal  ST&T Change: none, no acute ST segment change  ED ECG REPORT I, Joanne Gavel, the attending physician, personally  viewed and interpreted this ECG.   Date: 10/11/2014  EKG Time: 10:26  Rate: 68  Rhythm: unchanged from previous tracings, afib with rate 68  Axis: normal  Intervals:normal  ST&T Change: none, no acute ST segment change   ____________________________________________  RADIOLOGY  CXR: IMPRESSION: Chronic interstitial thickening, slightly increased. Likely related to smoking/chronic bronchitis.  Equivocal foci of increased density at the right lung base and right upper lobe. Cannot exclude early pneumonia. Given the patient's symptoms, recommend radiographic follow-up after appropriate antibiotic therapy. If these areas persist, CT followup would be  suggested to exclude 1 or more pulmonary nodules. ____________________________________________   PROCEDURES  Procedure(s) performed: None  Critical Care performed: No  ____________________________________________   INITIAL IMPRESSION / ASSESSMENT AND PLAN / ED COURSE  Pertinent labs & imaging results that were available during my care of the patient were reviewed by me and considered in my medical decision making (see chart for details).  Bowden Boody is a 64 y.o. 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 Clinical picture most consistent with community-acquired pneumonia. No hypoxia, no tachypnea or increased work of breathing. We'll give azithromycin. EKG today shows atrial fibrillation which appears rate controlled. His wife reports he has a history of "irregular heartbeat" but I do not see this formally documented on chart review. He does take Coumadin although he is mildly subtherapeutic today, will give an extra 1/2 dose of his regular coumadin dose  And I have encouraged him to take Coumadin as prescribed. I discussed this with Dr. Gwynneth Aliment (on Call for his doctor, Dr. Alene Mires), and the patient will be  seen in clinic at 9:30 AM in 4 days. I encouraged him  to return to the ER in 2 days for INR recheck. We discussed extensive return precautions and the patient is comfortable being discharged home. His wife is also comfortable. All questions were answered. ____________________________________________   FINAL CLINICAL IMPRESSION(S) / ED DIAGNOSES  Final diagnoses:  Cough  Community acquired pneumonia      Joanne Gavel, MD 10/11/14 1302

## 2014-10-12 ENCOUNTER — Inpatient Hospital Stay
Admission: EM | Admit: 2014-10-12 | Discharge: 2014-10-14 | DRG: 194 | Disposition: A | Discharge status: Home Health | Payer: Medicare Other | Attending: Internal Medicine | Admitting: Internal Medicine

## 2014-10-12 ENCOUNTER — Encounter: Payer: Self-pay | Admitting: *Deleted

## 2014-10-12 ENCOUNTER — Inpatient Hospital Stay: Payer: Medicare Other

## 2014-10-12 DIAGNOSIS — Z95820 Peripheral vascular angioplasty status with implants and grafts: Secondary | ICD-10-CM | POA: Diagnosis not present

## 2014-10-12 DIAGNOSIS — R1310 Dysphagia, unspecified: Secondary | ICD-10-CM | POA: Diagnosis present

## 2014-10-12 DIAGNOSIS — R042 Hemoptysis: Secondary | ICD-10-CM | POA: Diagnosis present

## 2014-10-12 DIAGNOSIS — I480 Paroxysmal atrial fibrillation: Secondary | ICD-10-CM | POA: Diagnosis present

## 2014-10-12 DIAGNOSIS — M109 Gout, unspecified: Secondary | ICD-10-CM | POA: Diagnosis present

## 2014-10-12 DIAGNOSIS — J690 Pneumonitis due to inhalation of food and vomit: Secondary | ICD-10-CM | POA: Diagnosis present

## 2014-10-12 DIAGNOSIS — Z79899 Other long term (current) drug therapy: Secondary | ICD-10-CM | POA: Diagnosis not present

## 2014-10-12 DIAGNOSIS — Z7901 Long term (current) use of anticoagulants: Secondary | ICD-10-CM | POA: Diagnosis not present

## 2014-10-12 DIAGNOSIS — I69391 Dysphagia following cerebral infarction: Secondary | ICD-10-CM | POA: Diagnosis not present

## 2014-10-12 DIAGNOSIS — R0600 Dyspnea, unspecified: Secondary | ICD-10-CM

## 2014-10-12 DIAGNOSIS — J189 Pneumonia, unspecified organism: Secondary | ICD-10-CM | POA: Diagnosis present

## 2014-10-12 DIAGNOSIS — Z86718 Personal history of other venous thrombosis and embolism: Secondary | ICD-10-CM | POA: Diagnosis not present

## 2014-10-12 DIAGNOSIS — F17210 Nicotine dependence, cigarettes, uncomplicated: Secondary | ICD-10-CM | POA: Diagnosis present

## 2014-10-12 DIAGNOSIS — I10 Essential (primary) hypertension: Secondary | ICD-10-CM | POA: Diagnosis present

## 2014-10-12 DIAGNOSIS — Z86711 Personal history of pulmonary embolism: Secondary | ICD-10-CM | POA: Diagnosis not present

## 2014-10-12 HISTORY — DX: Unspecified atrial fibrillation: I48.91

## 2014-10-12 HISTORY — DX: Other pulmonary embolism without acute cor pulmonale: I26.99

## 2014-10-12 LAB — CBC WITH DIFFERENTIAL/PLATELET
BASOS ABS: 0 10*3/uL (ref 0–0.1)
BASOS PCT: 0 %
EOS ABS: 0 10*3/uL (ref 0–0.7)
Eosinophils Relative: 0 %
HCT: 36.6 % — ABNORMAL LOW (ref 40.0–52.0)
Hemoglobin: 11.9 g/dL — ABNORMAL LOW (ref 13.0–18.0)
LYMPHS PCT: 8 %
Lymphs Abs: 0.7 10*3/uL — ABNORMAL LOW (ref 1.0–3.6)
MCH: 24.3 pg — AB (ref 26.0–34.0)
MCHC: 32.4 g/dL (ref 32.0–36.0)
MCV: 75 fL — ABNORMAL LOW (ref 80.0–100.0)
MONO ABS: 0.8 10*3/uL (ref 0.2–1.0)
Monocytes Relative: 9 %
Neutro Abs: 7.3 10*3/uL — ABNORMAL HIGH (ref 1.4–6.5)
Neutrophils Relative %: 83 %
Platelets: 118 10*3/uL — ABNORMAL LOW (ref 150–440)
RBC: 4.89 MIL/uL (ref 4.40–5.90)
RDW: 16.9 % — AB (ref 11.5–14.5)
WBC: 8.8 10*3/uL (ref 3.8–10.6)

## 2014-10-12 LAB — COMPREHENSIVE METABOLIC PANEL
ALK PHOS: 97 U/L (ref 38–126)
ALT: 39 U/L (ref 17–63)
ANION GAP: 6 (ref 5–15)
AST: 41 U/L (ref 15–41)
Albumin: 3.4 g/dL — ABNORMAL LOW (ref 3.5–5.0)
BILIRUBIN TOTAL: 2 mg/dL — AB (ref 0.3–1.2)
BUN: 12 mg/dL (ref 6–20)
CO2: 26 mmol/L (ref 22–32)
Calcium: 8.1 mg/dL — ABNORMAL LOW (ref 8.9–10.3)
Chloride: 100 mmol/L — ABNORMAL LOW (ref 101–111)
Creatinine, Ser: 0.96 mg/dL (ref 0.61–1.24)
GFR calc Af Amer: >60 mL/min (ref >60)
GFR calc non Af Amer: >60 mL/min (ref >60)
GLUCOSE: 120 mg/dL — AB (ref 65–99)
Potassium: 3.5 mmol/L (ref 3.5–5.1)
Sodium: 132 mmol/L — ABNORMAL LOW (ref 135–145)
Total Protein: 7.3 g/dL (ref 6.5–8.1)

## 2014-10-12 LAB — PROTIME-INR
INR: 1.54
Prothrombin Time: 18.7 seconds — ABNORMAL HIGH (ref 11.4–15.0)

## 2014-10-12 LAB — APTT: aPTT: 41 seconds — ABNORMAL HIGH (ref 24–36)

## 2014-10-12 MED ORDER — ACETAMINOPHEN 325 MG PO TABS
650.0000 mg | ORAL_TABLET | Freq: Four times a day (QID) | ORAL | Status: DC | PRN
  Administered 2014-10-12: 650 mg via ORAL
  Filled 2014-10-12: qty 2

## 2014-10-12 MED ORDER — IPRATROPIUM-ALBUTEROL 0.5-2.5 (3) MG/3ML IN SOLN: 3.0000 mL | Freq: Four times a day (QID) | RESPIRATORY_TRACT | Status: DC | PRN

## 2014-10-12 MED ORDER — SENNA 8.6 MG PO TABS: 1.0000 | ORAL_TABLET | Freq: Every day | ORAL | Status: DC | PRN

## 2014-10-12 MED ORDER — DOCUSATE SODIUM 100 MG PO CAPS: 100.0000 mg | ORAL_CAPSULE | Freq: Two times a day (BID) | ORAL | Status: DC | PRN

## 2014-10-12 MED ORDER — DEXTROSE 5 % IV SOLN
INTRAVENOUS | Status: AC
  Filled 2014-10-12: qty 500

## 2014-10-12 MED ORDER — ENOXAPARIN SODIUM 120 MG/0.8ML ~~LOC~~ SOLN
1.0000 mg/kg | SUBCUTANEOUS | Status: DC
  Administered 2014-10-12: 110 mg via SUBCUTANEOUS
  Filled 2014-10-12 (×4): qty 0.8

## 2014-10-12 MED ORDER — WARFARIN SODIUM 3 MG PO TABS
6.0000 mg | ORAL_TABLET | Freq: Every day | ORAL | Status: DC
  Administered 2014-10-12 – 2014-10-13 (×2): 6 mg via ORAL
  Filled 2014-10-12 (×2): qty 2

## 2014-10-12 MED ORDER — CEFTRIAXONE SODIUM IN DEXTROSE 20 MG/ML IV SOLN
1.0000 g | INTRAVENOUS | Status: DC
  Administered 2014-10-13 – 2014-10-14 (×2): 1 g via INTRAVENOUS
  Filled 2014-10-12 (×4): qty 50

## 2014-10-12 MED ORDER — AZITHROMYCIN 500 MG IV SOLR
500.0000 mg | Freq: Once | INTRAVENOUS | Status: DC
  Administered 2014-10-12: 500 mg via INTRAVENOUS

## 2014-10-12 MED ORDER — WARFARIN - PHYSICIAN DOSING INPATIENT: Freq: Every day | Status: DC

## 2014-10-12 MED ORDER — ACETAMINOPHEN 650 MG RE SUPP: 650.0000 mg | Freq: Four times a day (QID) | RECTAL | Status: DC | PRN

## 2014-10-12 MED ORDER — METOPROLOL TARTRATE 25 MG PO TABS
25.0000 mg | ORAL_TABLET | Freq: Two times a day (BID) | ORAL | Status: DC
  Administered 2014-10-12 – 2014-10-13 (×3): 25 mg via ORAL
  Filled 2014-10-12 (×5): qty 1

## 2014-10-12 MED ORDER — CEFTRIAXONE SODIUM IN DEXTROSE 20 MG/ML IV SOLN
INTRAVENOUS | Status: AC
  Filled 2014-10-12: qty 50

## 2014-10-12 MED ORDER — IOHEXOL 350 MG/ML SOLN
100.0000 mL | Freq: Once | INTRAVENOUS | Status: AC | PRN
  Administered 2014-10-12: 100 mL via INTRAVENOUS

## 2014-10-12 MED ORDER — ALBUTEROL SULFATE (2.5 MG/3ML) 0.083% IN NEBU: 2.5000 mg | INHALATION_SOLUTION | RESPIRATORY_TRACT | Status: DC | PRN

## 2014-10-12 MED ORDER — CEFTRIAXONE SODIUM IN DEXTROSE 20 MG/ML IV SOLN
1.0000 g | Freq: Once | INTRAVENOUS | Status: AC
  Administered 2014-10-12: 1 g via INTRAVENOUS

## 2014-10-12 MED ORDER — WARFARIN - PHARMACIST DOSING INPATIENT
Freq: Every day | Status: DC
  Administered 2014-10-13: 18:00:00

## 2014-10-12 MED ORDER — DEXTROSE 5 % IV SOLN
500.0000 mg | INTRAVENOUS | Status: DC
  Administered 2014-10-13: 500 mg via INTRAVENOUS
  Filled 2014-10-12 (×3): qty 500

## 2014-10-12 MED ORDER — COLCHICINE 0.6 MG PO TABS
0.6000 mg | ORAL_TABLET | Freq: Every day | ORAL | Status: DC
  Administered 2014-10-12 – 2014-10-13 (×2): 0.6 mg via ORAL
  Filled 2014-10-12 (×3): qty 1

## 2014-10-12 NOTE — Evaluation (Signed)
Physical Therapy Evaluation Patient Details Name: [name] MRN: [id number] DOB: December 06, 1950 Today's Date: 10/12/2014   History of Present Illness  [name] is a 64 y.o. male with a known history of CVA with residual dysphagia, gout, history of DVT and PE on Coumadin, atrial fibrillation comes to the hospital secondary to worsening dyspnea and cough with bloody sputum. He went to see PCP who gave antibiotics, but his cough continued to worsen; Chest X-ray showed right lower lobe pneumonia.   Clinical Impression  64 yo Male with pneumonia presents to hospital reporting increased weakness and fatigue. Patient was living alone and caring for himself modified independent. He reports using SPC with gait tasks and denies any recent falls. Currently he is mod I with bed mobility, CGA for sit<>stand transfers and CGA with gait tasks using SPC. Patient does ambulate at slower gait speed, narrow base of support with poor foot clearance. He would benefit from additional skilled PT intervention to improve LE strength, balance/gait safety. Patient would benefit from home Health PT upon discharge to improve LE strength and assess safety at home.    Follow Up Recommendations Home health PT    Equipment Recommendations       Recommendations for Other Services       Precautions / Restrictions Precautions Precautions: Fall Restrictions Weight Bearing Restrictions: No      Mobility  Bed Mobility Overal bed mobility: Modified Independent             General bed mobility comments: uses bed rail and elevated head of bed; able to transition supine to sit, mod I  Transfers Overall transfer level: Needs assistance Equipment used: Straight cane Transfers: Sit to/from Stand Sit to Stand: Min guard         General transfer comment: Requires CGA  for sit<>Stand transfer from bed demonstrating good safety awareness.  Ambulation/Gait Ambulation/Gait assistance: Min  guard Ambulation Distance (Feet): 80 Feet Assistive device: Straight cane     Gait velocity interpretation: Below normal speed for age/gender General Gait Details: Patient ambulates with SPC, CGA with narrow base of support with slower gait speed, good foot clearance, short step length  Stairs            Wheelchair Mobility    Modified Rankin (Stroke Patients Only)       Balance Overall balance assessment: Needs assistance Sitting-balance support: Single extremity supported Sitting balance-Leahy Scale: Good     Standing balance support: Single extremity supported Standing balance-Leahy Scale: Fair Standing balance comment: Able to stand with SPC without assistance; unable to perform tandem stance or SLS; dynamic standing balance is poor;                              Pertinent Vitals/Pain Pain Assessment: 0-10 Pain Score: 5  Pain Location: LLE Pain Descriptors / Indicators: Aching Pain Intervention(s): Repositioned    Home Living Family/patient expects to be discharged to:: Private residence Living Arrangements: Alone Available Help at Discharge: Family (2 daughters live nearby and can check on him as needed; ) Type of Home: House Home Access: Stairs to enter Entrance Stairs-Rails: Right Entrance Stairs-Number of Steps: 2 Home Layout: Two level Home Equipment: Cane - single point      Prior Function Level of Independence: Independent with assistive device(s)         Comments: reports being able to care for self mod I, including bathing/dressing; uses SPC with gait tasks  Hand Dominance   Dominant Hand: Right    Extremity/Trunk Assessment   Upper Extremity Assessment: Overall WFL for tasks assessed           Lower Extremity Assessment: Overall WFL for tasks assessed (BLE grossly 4/5 hip/knee and 3/5 for foot/ankle)      Cervical / Trunk Assessment: Normal  Communication   Communication: No difficulties  Cognition  Arousal/Alertness: Lethargic Behavior During Therapy: WFL for tasks assessed/performed Overall Cognitive Status: Within Functional Limits for tasks assessed                      General Comments      Exercises        Assessment/Plan    PT Assessment Patient needs continued PT services  PT Diagnosis Difficulty walking;Generalized weakness   PT Problem List Decreased strength;Pain;Decreased safety awareness;Decreased activity tolerance;Decreased balance;Decreased mobility  PT Treatment Interventions Gait training;Patient/family education;Stair training;Functional mobility training;Therapeutic activities;Therapeutic exercise;Balance training;Neuromuscular re-education   PT Goals (Current goals can be found in the Care Plan section) Acute Rehab PT Goals Patient Stated Goal: to go back home/get better PT Goal Formulation: With patient Time For Goal Achievement: 10/26/14 Potential to Achieve Goals: Good    Frequency Min 2X/week   Barriers to discharge Inaccessible home environment number of steps to enter house; lives alone with limited caregiver support    Co-evaluation               End of Session Equipment Utilized During Treatment: Gait belt Activity Tolerance: Patient tolerated treatment well Patient left: in chair;with call bell/phone within reach;with chair alarm set Nurse Communication: Mobility status         Time: 1435-1455 PT Time Calculation (min) (ACUTE ONLY): 20 min   Charges:   PT Evaluation $Initial PT Evaluation Tier I: 1 Procedure     PT G Codes:        Hopkins,Margaret, PT, DPT 10/12/2014, 3:06 PM

## 2014-10-12 NOTE — ED Provider Notes (Signed)
[name] Department Provider Note  ____________________________________________  Time seen: Approximately 7:20 AM  I have reviewed the triage vital signs and the nursing notes.   HISTORY  Chief Complaint Hemoptysis    HPI [name] is a 64 y.o. 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 Patient was seen by me in this ER yesterday for worsening cough and chills despite Augmentin prescribed by his PCP. Chest x-ray was concerning for right sided pneumonia. He received a dose of azithromycin IV and was discharged with azithromycin by mouth. He reports that since discharge he has continued to feel poorly, he is having fevers and shaking chills at home, he is coughing up quarter-sized blood clots and is increasingly short of breath. No chest pain, vomiting, diarrhea. No modifying factors. Current severity is moderate.   Past Medical History  Diagnosis Date  . History of deep vein thrombosis 2006  . Hypertension   . Gout current and history of  . History of tobacco abuse     has smoked for 50 years  . Cocaine abuse     + UDS on admission  . Stroke     Patient Active Problem List   Diagnosis Date Noted  . CVA (cerebral infarction) 04/26/2011  . Diabetes mellitus 04/26/2011  . 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 Anemia 04/26/2011  . DVT (deep venous thrombosis) 04/26/2011    Past Surgical History  Procedure Laterality Date  . Cardiac surgery      Stent    Current Outpatient Rx  Name  Route  Sig  Dispense  Refill  . azithromycin (ZITHROMAX Z-PAK) 250 MG tablet      Take 1 tablet (250 mg) once daily for 4 days (starting tomorrow).   4 each   0   . colchicine 0.6 MG tablet   Oral   Take 0.6 mg by mouth daily.         . metoprolol  tartrate (LOPRESSOR) 25 MG tablet   Oral   Take 25 mg by mouth 2 (two) times daily.         [name] warfarin (COUMADIN) 6 MG tablet   Oral   Take 6 mg by mouth daily. Every evening at 5 pm         . amoxicillin-clavulanate (AUGMENTIN) 875-125 MG per tablet   Oral   Take 1 tablet by mouth 2 (two) times daily. For 10 days           Allergies Review of patient's allergies indicates no known allergies.  Family History  Problem Relation Age of Onset  . Aneurysm Mother   . Diabetes Father     Social History History  Substance Use Topics  . Smoking status: Current Every Day Smoker -- 0.50 packs/day for 50 years    Types: Cigarettes  . Smokeless tobacco: Never Used  . Alcohol Use: No    Review of Systems Constitutional: +fever/chills Eyes: No visual changes. ENT: No sore throat. Cardiovascular: Denies chest pain. Respiratory: + shortness of breath. Gastrointestinal: No abdominal pain.  No nausea, no vomiting.  No diarrhea.  No constipation. Genitourinary: Negative for dysuria. Musculoskeletal: Negative for back pain. Skin: Negative for rash. Neurological: Negative for headaches, focal weakness or numbness.  10-point ROS otherwise negative.  ____________________________________________   PHYSICAL EXAM:  VITAL SIGNS: ED Triage Vitals  Enc  Vitals Group     BP 10/12/14 0650 156/92 mmHg     Pulse Rate 10/12/14 0650 89     Resp 10/12/14 0650 28     Temp 10/12/14 0650 98.8 F (37.1 C)     Temp Source 10/12/14 0650 Oral     SpO2 10/12/14 0650 97 %     Weight 10/12/14 0650 238 lb (107.956 kg)     Height 10/12/14 0650 '6\' 2"'$  (1.88 m)     Head Cir --      Peak Flow --      Pain Score 10/12/14 0651 8     Pain Loc --      Pain Edu? --      Excl. in [place]? --     Constitutional: Alert and oriented. Nontoxic- appearing and in no acute distress. Eyes: Conjunctivae are normal. PERRL. EOMI. Head: Atraumatic. Nose: No congestion/rhinnorhea. Mouth/Throat: Mucous membranes  are slightly dry.  Oropharynx non-erythematous. Neck: No stridor. Cardiovascular: Normal rate, irregularly irregular rhythm. Grossly normal heart sounds.  Good peripheral circulation. Respiratory: Tachypnea, mildly increased work of breathing, diminished breath sounds in the right base Gastrointestinal: Soft and nontender. No distention. No abdominal bruits. No CVA tenderness. Genitourinary: deferred Musculoskeletal: No lower extremity tenderness nor edema.  No joint effusions. Neurologic:  Normal speech and language. No gross focal neurologic deficits are appreciated. Speech is normal. No gait instability. Skin:  Skin is warm, dry and intact. No rash noted. Psychiatric: Mood and affect are normal. Speech and behavior are normal.  ____________________________________________   LABS (all labs ordered are listed, but only abnormal results are displayed)  Labs Reviewed  CBC WITH DIFFERENTIAL/PLATELET - Abnormal; Notable for the following:    Hemoglobin 11.9 (*)    HCT 36.6 (*)    MCV 75.0 (*)    MCH 24.3 (*)    RDW 16.9 (*)    Platelets 118 (*)    Neutro Abs 7.3 (*)    Lymphs Abs 0.7 (*)    All other components within normal limits  CULTURE, BLOOD (ROUTINE X 2)  CULTURE, BLOOD (ROUTINE X 2)  COMPREHENSIVE METABOLIC PANEL  APTT  PROTIME-INR   ____________________________________________  EKG  ED ECG REPORT I, Joanne Gavel, the attending physician, personally viewed and interpreted this ECG.   Date: 10/12/2014  EKG Time: 07:41  Rate: 91  Rhythm: atrial fibrillation, rate 91  Axis: normal  Intervals:normal QRS and QTc  ST&T Change: nonspecific ST abnormality  ____________________________________________  RADIOLOGY  See CXR from 10/11/14 ____________________________________________   PROCEDURES  Procedure(s) performed: None  Critical Care performed: No  ____________________________________________   INITIAL IMPRESSION / ASSESSMENT AND PLAN / ED  COURSE  Pertinent labs & imaging results that were available during my care of the patient were reviewed by me and considered in my medical decision making (see chart for details).  Gauge Winski is a 64 y.o. 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 On exam, he is tachypneic with slightly increased work of breathing. No hypoxia. No evidence of massive hemoptysis, stable hemoglobin. We'll give ceftriaxone, azithromycin and admit for  Inpatient treatment of community-acquired pneumonia. EKG did again shows atrial fibrillation today and INR remains slightly subtherapeutic at 1.54 despite receiving additional Coumadin yesterday. Disucussed with hospitalist for admission. ____________________________________________   FINAL CLINICAL IMPRESSION(S) / ED DIAGNOSES  Final diagnoses:  CAP (  community acquired pneumonia)  Cough with hemoptysis      Joanne Gavel, MD 10/12/14 [id number]

## 2014-10-12 NOTE — Progress Notes (Signed)
ANTICOAGULATION CONSULT NOTE - Initial Consult  Pharmacy Consult for warfarin Indication: atrial fibrillation, hx DVT/PE  No Known Allergies  Patient Measurements: Height: '6\' 2"'$  (188 cm) Weight: 238 lb (107.956 kg) IBW/kg (Calculated) : 82.2  Vital Signs: Temp: 98.6 F (37 C) (05/28 0948) Temp Source: Oral (05/28 0948) BP: 147/88 mmHg (05/28 1036) Pulse Rate: 86 (05/28 0900)  Labs:  Recent Labs  10/11/14 0921 10/12/14 0722  HGB 11.6* 11.9*  HCT 36.5* 36.6*  PLT 120* 118*  APTT 39* 41*  LABPROT 19.5* 18.7*  INR 1.63 1.54  CREATININE 0.94 0.96  TROPONINI <0.03  --     Estimated Creatinine Clearance: 100.4 mL/min (by C-G formula based on Cr of 0.96).   Medical History: Past Medical History  Diagnosis Date  . History of deep vein thrombosis 2006  . Hypertension   . Gout current and history of  . History of tobacco abuse     has smoked for 50 years  . Cocaine abuse     + UDS on admission  . Stroke   . Atrial fibrillation   . Pulmonary embolism     Medications:  Scheduled:  . [START ON 10/13/2014] azithromycin  500 mg Intravenous Q24H  . [START ON 10/13/2014] cefTRIAXone (ROCEPHIN)  IV  1 g Intravenous Q24H  . colchicine  0.6 mg Oral Daily  . enoxaparin (LOVENOX) injection  1 mg/kg Subcutaneous Q24H  . metoprolol tartrate  25 mg Oral BID  . warfarin  6 mg Oral Daily  . Warfarin - Physician Dosing Inpatient   Does not apply q1800    Assessment: 64 yo male with hx of CVA, AFib, DVT/PE on Coumadin to continue Coumadin Bridging with enoxaparin 110 mg SQ q12h Per med rec, pt on Coumadin 6 mg daily Pt noted to be on ceftriaxone/azithromycin  5/27 INR 1.63, Hgb 11.6, plt 120 - Coumadin 2.5 mg  5/28 INR 1.54, Hgb 11.9, plt 118   Goal of Therapy:  INR 2-3 Monitor platelets by anticoagulation protocol: Yes   Plan:  Will continue current MD orders for Coumadin 6 mg PO daily Follow up INR ordered for AM CBC stable from yesterday    Rayna Sexton, PharmD,  BCPS Clinical Pharmacist 10/12/2014,12:06 PM

## 2014-10-12 NOTE — Progress Notes (Signed)
Pt admitted from ED. Pt had no complaints of pain nor nausea. Tolerating diet; Temp increased to 101; Given tylenol once. Pt resting well.

## 2014-10-12 NOTE — ED Notes (Signed)
Pt has been having increasing amount of blood in sputum. Pt dx'd yesterday w/ pneumonia. Has not started his abx yet, supposed to start today. Pt is tachypneic and dyspneic at rest.

## 2014-10-12 NOTE — H&P (Signed)
[name] at [place] NAME: [name]    MR#:  [id number]  DATE OF BIRTH:  Mar 09, 1951  DATE OF ADMISSION:  10/12/2014  PRIMARY CARE PHYSICIAN: Dr. Ladoris Gene  REQUESTING/REFERRING PHYSICIAN: Dr. Loura Pardon  CHIEF COMPLAINT:   Chief Complaint  Patient presents with  . Hemoptysis    HISTORY OF PRESENT ILLNESS:  [name]  is a 64 y.o. male with a known history of CVA with residual dysphagia, gout, history of DVT and PE on Coumadin, atrial fibrillation comes to the hospital secondary to worsening dyspnea and cough with bloody sputum. Patient states that started as cold and congestion last week. He went to PCP and he was started on Augmentin for possible pneumonia and bronchitis. He continued to get worse and presented to the emergency room yesterday. He was given azithromycin and was discharged. Chest x-ray here showed a right lower lobe pneumonia. After discharge from the emergency room, patient continued to get worse. Couldn't sleep was short of breath all night long. Started coughing Productive of sputum and blood streaks in the sputum. Today presented to the ER again today.  PAST MEDICAL HISTORY:   Past Medical History  Diagnosis Date  . History of deep vein thrombosis 2006  . Hypertension   . Gout current and history of  . History of tobacco abuse     has smoked for 50 years  . Cocaine abuse     + UDS on admission  . Stroke   . Atrial fibrillation   . Pulmonary embolism     PAST SURGICAL HISTORY:   Past Surgical History  Procedure Laterality Date  . Vascular surgery      Stent in leg    SOCIAL HISTORY:   History  Substance Use Topics  . Smoking status: Current Every Day Smoker -- 0.50 packs/day for 50 years    Types: Cigarettes  . Smokeless tobacco: Never Used  . Alcohol Use: No    FAMILY HISTORY:   Family History  Problem Relation Age of Onset  . Aneurysm Mother   . Diabetes Father      DRUG ALLERGIES:  No Known Allergies  REVIEW OF SYSTEMS:   Review of Systems  Constitutional: Positive for fever, chills and malaise/fatigue. Negative for weight loss.  HENT: Negative for ear discharge, ear pain, hearing loss, nosebleeds and tinnitus.        Dysphasia from previous stroke  Eyes: Negative for blurred vision, double vision and photophobia.  Respiratory: Positive for cough, hemoptysis, sputum production and shortness of breath. Negative for wheezing.   Cardiovascular: Negative for chest pain, palpitations, orthopnea and leg swelling.  Gastrointestinal: Positive for nausea. Negative for heartburn, vomiting, abdominal pain, diarrhea, constipation and melena.  Genitourinary: Negative for dysuria, urgency, frequency and hematuria.  Musculoskeletal: Negative for myalgias, back pain and neck pain.  Skin: Negative for rash.  Neurological: Negative for dizziness, tingling, tremors, sensory change, speech change, focal weakness and headaches.  Endo/Heme/Allergies: Does not bruise/bleed easily.  Psychiatric/Behavioral: Negative for depression.    MEDICATIONS AT HOME:   Prior to Admission medications   Medication Sig Start Date End Date Taking? Authorizing Provider  azithromycin (ZITHROMAX Z-PAK) 250 MG tablet Take 1 tablet (250 mg) once daily for 4 days (starting tomorrow). 10/11/14 10/16/14 Yes Joanne Gavel, MD  colchicine 0.6 MG tablet Take 0.6 mg by mouth daily as needed (for gout).    Yes Historical Provider, MD  metoprolol tartrate (LOPRESSOR) 25 MG tablet Take  25 mg by mouth 2 (two) times daily.   Yes Historical Provider, MD  warfarin (COUMADIN) 6 MG tablet Take 6 mg by mouth daily. Every evening at 5 pm   Yes Historical Provider, MD      VITAL SIGNS:  Blood pressure 147/88, pulse 86, temperature 98.6 F (37 C), temperature source Oral, resp. rate 24, height '6\' 2"'$  (1.88 m), weight 107.956 kg (238 lb), SpO2 96 %.  PHYSICAL EXAMINATION:   Physical Exam  GENERAL:  64  y.o.-year-old patient lying in the bed with no acute distress.  EYES: Pupils equal, round, reactive to light and accommodation. No scleral icterus. Extraocular muscles intact.  HEENT: Right facial droop noted from previous stroke Head atraumatic, normocephalic. Oropharynx and nasopharynx clear.  NECK:  Supple, no jugular venous distention. No thyroid enlargement, no tenderness.  LUNGS: Normal breath sounds bilaterally, no wheezing, rales,rhonchi or crepitation. No use of accessory muscles of respiration. Decreased bibasilar breath sounds. Right basilar rhonchi. CARDIOVASCULAR: S1, S2 normal. No murmurs, rubs, or gallops.  ABDOMEN: Soft, nontender, nondistended. Bowel sounds present. No organomegaly or mass.  EXTREMITIES: No pedal edema, cyanosis, or clubbing.  NEUROLOGIC: Cranial nerves II through XII are intact. Muscle strength 5/5 in all extremities. Sensation intact. Gait not checked.  PSYCHIATRIC: The patient is alert and oriented x 3.  SKIN: No obvious rash, lesion, or ulcer.   LABORATORY PANEL:   CBC  Recent Labs Lab 10/12/14 0722  WBC 8.8  HGB 11.9*  HCT 36.6*  PLT 118*   ------------------------------------------------------------------------------------------------------------------  Chemistries   Recent Labs Lab 10/12/14 0722  NA 132*  K 3.5  CL 100*  CO2 26  GLUCOSE 120*  BUN 12  CREATININE 0.96  CALCIUM 8.1*  AST 41  ALT 39  ALKPHOS 97  BILITOT 2.0*   ------------------------------------------------------------------------------------------------------------------  Cardiac Enzymes  Recent Labs Lab 10/11/14 0921  TROPONINI <0.03   ------------------------------------------------------------------------------------------------------------------  RADIOLOGY:  Dg Chest 2 View  10/11/2014   CLINICAL DATA:  Body aches.  Weakness.  Cough for 1 week.  Smoker.  EXAM: CHEST  2 VIEW  COMPARISON:  01/25/2013  FINDINGS: Lateral view degraded by patient arm  position. Midline trachea. Mild cardiomegaly with a mildly tortuous thoracic aorta. No pleural effusion or pneumothorax. Diffuse interstitial thickening is slightly increased. Vague increased density at the right lung base, especially laterally. Possible nodular component at 9 mm. Alternatively this could represent a nipple shadow. Equivocal foci of right upper lobe increased density. Clear left lung.  IMPRESSION: Chronic interstitial thickening, slightly increased. Likely related to smoking/chronic bronchitis.  Equivocal foci of increased density at the right lung base and right upper lobe. Cannot exclude early pneumonia. Given the patient's symptoms, recommend radiographic follow-up after appropriate antibiotic therapy. If these areas persist, CT followup would be suggested to exclude 1 or more pulmonary nodules.   Electronically Signed   By: Abigail Miyamoto M.D.   On: 10/11/2014 09:23    EKG:   Orders placed or performed during the hospital encounter of 10/12/14  . ED EKG  . ED EKG    IMPRESSION AND PLAN:   Tradarius Reinwald  is a 64 y.o. male with a known history of CVA with residual dysphagia, gout, history of DVT and PE on Coumadin, atrial fibrillation comes to the hospital secondary to worsening dyspnea and cough with bloody sputum.  #1 community-acquired pneumonia-failed outpatient antivirals with Augmentin and azithromycin. Blood cultures have been drawn yesterday from the emergency room. No active fevers at this time. No elevated white count.  Will admit and start on Rocephin and azithromycin. Also will do DuoNeb's. -With his sputum and hemoptysis, history of PE, INR being subtherapeutic-will order CT angiogram. Also sputum cultures have been ordered. -Oxygen support as needed. Monitor at this time.  #2 atrial fibrillation-rate well controlled on metoprolol. Prior history of A. fib present. On Coumadin. INR is subtherapeutic. We'll bridge with Lovenox.  #3 history of DVT and  PE-subtherapeutic INR on Coumadin. Start Lovenox therapeutic dose to bridge. CT angiogram pending today.  #4 gout-no current symptoms. Continue colchicine.  #5 history of CVA-residual dysphasia present. At baseline. No new symptoms. On Coumadin. INR is subtherapeutic.    All the records are reviewed and case discussed with ED provider. Management plans discussed with the patient, family and they are in agreement.  CODE STATUS: Full code  TOTAL TIME TAKING CARE OF THIS PATIENT: 50 minutes.    Gladstone Lighter M.D on 10/12/2014 at 11:12 AM  Between 7am to 6pm - Pager - [phone]  After 6pm go to [email or web address] - password [email or web address] [facility]  [facility]  Office  [phone]  CC: Primary care physician; No PCP Per Patient

## 2014-10-13 LAB — CBC
HCT: 35.9 % — ABNORMAL LOW (ref 40.0–52.0)
Hemoglobin: 11.7 g/dL — ABNORMAL LOW (ref 13.0–18.0)
MCH: 24.4 pg — ABNORMAL LOW (ref 26.0–34.0)
MCHC: 32.7 g/dL (ref 32.0–36.0)
MCV: 74.6 fL — AB (ref 80.0–100.0)
Platelets: 120 10*3/uL — ABNORMAL LOW (ref 150–440)
RBC: 4.81 MIL/uL (ref 4.40–5.90)
RDW: 16.6 % — ABNORMAL HIGH (ref 11.5–14.5)
WBC: 7.9 10*3/uL (ref 3.8–10.6)

## 2014-10-13 LAB — BASIC METABOLIC PANEL
Anion gap: 6 (ref 5–15)
BUN: 13 mg/dL (ref 6–20)
CALCIUM: 8 mg/dL — AB (ref 8.9–10.3)
CHLORIDE: 102 mmol/L (ref 101–111)
CO2: 24 mmol/L (ref 22–32)
CREATININE: 0.79 mg/dL (ref 0.61–1.24)
GFR calc non Af Amer: >60 mL/min (ref >60)
Glucose, Bld: 111 mg/dL — ABNORMAL HIGH (ref 65–99)
POTASSIUM: 3.3 mmol/L — AB (ref 3.5–5.1)
Sodium: 132 mmol/L — ABNORMAL LOW (ref 135–145)

## 2014-10-13 LAB — PROTIME-INR
INR: 1.59
Prothrombin Time: 19.1 seconds — ABNORMAL HIGH (ref 11.4–15.0)

## 2014-10-13 MED ORDER — POTASSIUM CHLORIDE CRYS ER 20 MEQ PO TBCR
40.0000 meq | EXTENDED_RELEASE_TABLET | Freq: Once | ORAL | Status: AC
  Administered 2014-10-13: 40 meq via ORAL
  Filled 2014-10-13: qty 2

## 2014-10-13 NOTE — Progress Notes (Signed)
ANTICOAGULATION CONSULT NOTE -Follow Up  Pharmacy Consult for warfarin Indication: atrial fibrillation, hx DVT/PE  No Known Allergies  Patient Measurements: Height: '6\' 2"'$  (188 cm) Weight: 238 lb (107.956 kg) IBW/kg (Calculated) : 82.2  Vital Signs: Temp: 98.3 F (36.8 C) (05/29 0739) Temp Source: Oral (05/29 0739) BP: 157/88 mmHg (05/29 0739) Pulse Rate: 89 (05/29 0739)  Labs:  Recent Labs  10/11/14 0921 10/12/14 0722 10/13/14 0729  HGB 11.6* 11.9* 11.7*  HCT 36.5* 36.6* 35.9*  PLT 120* 118* 120*  APTT 39* 41*  --   LABPROT 19.5* 18.7* 19.1*  INR 1.63 1.54 1.59  CREATININE 0.94 0.96 0.79  TROPONINI <0.03  --   --     Estimated Creatinine Clearance: 120.4 mL/min (by C-G formula based on Cr of 0.79).   Medical History: Past Medical History  Diagnosis Date  . History of deep vein thrombosis 2006  . Hypertension   . Gout current and history of  . History of tobacco abuse     has smoked for 50 years  . Cocaine abuse     + UDS on admission  . Stroke   . Atrial fibrillation   . Pulmonary embolism     Medications:  Scheduled:  . azithromycin  500 mg Intravenous Q24H  . cefTRIAXone (ROCEPHIN)  IV  1 g Intravenous Q24H  . colchicine  0.6 mg Oral Daily  . enoxaparin (LOVENOX) injection  1 mg/kg Subcutaneous Q24H  . metoprolol tartrate  25 mg Oral BID  . potassium chloride  40 mEq Oral Once  . warfarin  6 mg Oral Daily  . Warfarin - Pharmacist Dosing Inpatient   Does not apply q1800    Assessment: 64 yo male with hx of CVA, AFib, DVT/PE on Coumadin to continue Coumadin Bridging with enoxaparin 110 mg SQ q12h Per med rec, pt on Coumadin 6 mg daily Pt noted to be on ceftriaxone/azithromycin  5/27 INR 1.63, Hgb 11.6, plt 120 - Coumadin 2.5 mg  5/28 INR 1.54, Hgb 11.9, plt 118 Coumadin '6mg'$  5/29 INR 1.59     Goal of Therapy:  INR 2-3 Monitor platelets by anticoagulation protocol: Yes   Plan:  Will continue current MD orders for Coumadin 6 mg PO  daily Follow up INR ordered for AM CBC stable from yesterday    Chinita Greenland PharmD Clinical Pharmacist 10/13/2014

## 2014-10-13 NOTE — Progress Notes (Signed)
[place] [facility] [place] NAME: [name]    MR#:  [id number]  DATE OF BIRTH:  Dec 24, 1950  SUBJECTIVE:  CHIEF COMPLAINT:   Chief Complaint  Patient presents with  . Hemoptysis  - Spiked a temp of 101F last evening - Feels some better, still coughing sputum mixed with blood. - On lovenox and coumadin for his PE history - CT and CXR with RLL pneumonia  REVIEW OF SYSTEMS:  Review of Systems  Constitutional: Negative for fever and chills.  Respiratory: Positive for cough, hemoptysis, sputum production and shortness of breath. Negative for wheezing.   Cardiovascular: Negative for chest pain and palpitations.  Gastrointestinal: Negative for nausea, vomiting, abdominal pain, diarrhea and constipation.  Genitourinary: Negative for dysuria.  Neurological: Negative for dizziness, seizures and headaches.    DRUG ALLERGIES:  No Known Allergies  VITALS:  Blood pressure 157/88, pulse 89, temperature 98.3 F (36.8 C), temperature source Oral, resp. rate 16, height '6\' 2"'$  (1.88 m), weight 107.956 kg (238 lb), SpO2 98 %.  PHYSICAL EXAMINATION:  Physical Exam  GENERAL: 64 y.o.-year-old patient lying in the bed with no acute distress.  EYES: Pupils equal, round, reactive to light and accommodation. No scleral icterus. Extraocular muscles intact.  HEENT: Right facial droop noted from previous stroke Head atraumatic, normocephalic. Oropharynx and nasopharynx clear.  NECK: Supple, no jugular venous distention. No thyroid enlargement, no tenderness.  LUNGS: Normal breath sounds bilaterally, no wheezing, rales,rhonchi or crepitation. No use of accessory muscles of respiration. Decreased bibasilar breath sounds. Right basilar rhonchi. CARDIOVASCULAR: S1, S2 normal. No murmurs, rubs, or gallops.  ABDOMEN: Soft, nontender, nondistended. Bowel sounds present. No organomegaly or mass.  EXTREMITIES: No pedal edema, cyanosis, or clubbing.   NEUROLOGIC: Cranial nerves II through XII are intact. Muscle strength 5/5 in all extremities. Sensation intact. Gait not checked.  PSYCHIATRIC: The patient is alert and oriented x 3.  SKIN: No obvious rash, lesion, or ulcer.   LABORATORY PANEL:   CBC  Recent Labs Lab 10/13/14 0729  WBC 7.9  HGB 11.7*  HCT 35.9*  PLT 120*   ------------------------------------------------------------------------------------------------------------------  Chemistries   Recent Labs Lab 10/12/14 0722 10/13/14 0729  NA 132* 132*  K 3.5 3.3*  CL 100* 102  CO2 26 24  GLUCOSE 120* 111*  BUN 12 13  CREATININE 0.96 0.79  CALCIUM 8.1* 8.0*  AST 41  --   ALT 39  --   ALKPHOS 97  --   BILITOT 2.0*  --    ------------------------------------------------------------------------------------------------------------------  Cardiac Enzymes  Recent Labs Lab 10/11/14 0921  TROPONINI <0.03   ------------------------------------------------------------------------------------------------------------------  RADIOLOGY:  Dg Chest 2 View  10/11/2014   CLINICAL DATA:  Body aches.  Weakness.  Cough for 1 week.  Smoker.  EXAM: CHEST  2 VIEW  COMPARISON:  01/25/2013  FINDINGS: Lateral view degraded by patient arm position. Midline trachea. Mild cardiomegaly with a mildly tortuous thoracic aorta. No pleural effusion or pneumothorax. Diffuse interstitial thickening is slightly increased. Vague increased density at the right lung base, especially laterally. Possible nodular component at 9 mm. Alternatively this could represent a nipple shadow. Equivocal foci of right upper lobe increased density. Clear left lung.  IMPRESSION: Chronic interstitial thickening, slightly increased. Likely related to smoking/chronic bronchitis.  Equivocal foci of increased density at the right lung base and right upper lobe. Cannot exclude early pneumonia. Given the patient's symptoms, recommend radiographic follow-up after  appropriate antibiotic therapy. If these areas persist, CT followup would  be suggested to exclude 1 or more pulmonary nodules.   Electronically Signed   By: Abigail Miyamoto M.D.   On: 10/11/2014 09:23   Ct Angio Chest Pe W/cm &/or Wo Cm  10/12/2014   CLINICAL DATA:  Chest pain for 3 days with increasing shortness of breath  EXAM: CT ANGIOGRAPHY CHEST WITH CONTRAST  TECHNIQUE: Multidetector CT imaging of the chest was performed using the standard protocol during bolus administration of intravenous contrast. Multiplanar CT image reconstructions and MIPs were obtained to evaluate the vascular anatomy.  CONTRAST:  143m OMNIPAQUE IOHEXOL 350 MG/ML SOLN  COMPARISON:  10/11/2014 chest radiograph, chest CT 06/05/2012  FINDINGS: Mediastinum/Nodes: The examination is adequate for evaluation for acute pulmonary embolism up to and including the 3rd order pulmonary arteries. Allowing for a few areas of linear internal presumed flow artifact, or possible thin linear web formation from remote prior pulmonary emboli, no acute filling defect is identified up to and including the third order pulmonary arteries to suggest acute pulmonary embolism.  Mild lymphadenopathy is identified with pretracheal node measuring 1.5 cm image 60. Subcarinal lymphadenopathy measures 2.0 cm image 79. Mild cardiomegaly noted with biatrial prominence.  Lungs/Pleura: Subpleural areas of scarring or atelectasis are noted. There is dense consolidation of the right lower lobe with mild bilateral lower lobe bronchial wall thickening. The consolidated segment is perfused. Secretions are noted within the right mainstem bronchus dependently.  Upper abdomen: Mild adrenal prominence without measurable mass may reflect hyperplasia.  Musculoskeletal: Degenerative changes are noted in the spine. No compression deformity or acute osseous abnormality.  Review of the MIP images confirms the above findings.  IMPRESSION: No CT evidence for acute pulmonary embolism up to  and including the third order pulmonary arteries.  Dense right lower lobe consolidation most compatible with pneumonia or less likely aspiration.  Triangular areas of presumed subpleural scarring may be related to previous pulmonary emboli/infarct.   Electronically Signed   By: GConchita ParisM.D.   On: 10/12/2014 11:30    EKG:   Orders placed or performed during the hospital encounter of 10/12/14  . ED EKG  . ED EKG    ASSESSMENT AND PLAN:   WBasel Defalcois a 64y.o. male with a known history of CVA with residual dysphagia, gout, history of DVT and PE on Coumadin, atrial fibrillation comes to the hospital secondary to worsening dyspnea and cough with bloody sputum.  #1 community-acquired pneumonia-failed outpatient treatments with Augmentin and azithromycin.  - Blood cultures have been drawn from the emergency room. Fever last evening -  on Rocephin and azithromycin. Also  DuoNeb's. -With his sputum and hemoptysis, - sputum cultures have been ordered. -Oxygen support as needed. Monitor at this time. - If continues to spike fevers today- will change ABX to zosyn to cover for pseudomonas  #2 atrial fibrillation-rate well controlled on metoprolol. Prior history of A. fib present. On Coumadin. INR is subtherapeutic. Being bridged with Lovenox.  #3 history of DVT and PE-subtherapeutic INR on Coumadin. On Lovenox therapeutic dose to bridge. CT angiogram with no acute PE.. INR is at 1.5 today  #4 gout-no current symptoms. Continue colchicine.  #5 history of CVA-residual dysphasia present. At baseline. No new symptoms. On Coumadin. INR is subtherapeutic.   All the records are reviewed and case discussed with Care Management/Social Workerr. Management plans discussed with the patient, family and they are in agreement.  CODE STATUS: Full Code  TOTAL TIME TAKING CARE OF THIS PATIENT: 36 minutes.   POSSIBLE D/C  IN 1-2 DAYS, DEPENDING ON CLINICAL CONDITION.   Meeghan Skipper  M.D on 10/13/2014 at 9:00 AM  Between 7am to 6pm - Pager - [phone]  After 6pm go to [email or web address] - password [email or web address] [facility]  [facility] [facility]  Office  [phone]  CC: Primary care physician; No PCP Per Patient

## 2014-10-14 LAB — EXPECTORATED SPUTUM ASSESSMENT W REFEX TO RESP CULTURE

## 2014-10-14 LAB — BASIC METABOLIC PANEL
Anion gap: 8 (ref 5–15)
BUN: 13 mg/dL (ref 6–20)
CO2: 25 mmol/L (ref 22–32)
Calcium: 8.1 mg/dL — ABNORMAL LOW (ref 8.9–10.3)
Chloride: 102 mmol/L (ref 101–111)
Creatinine, Ser: 0.75 mg/dL (ref 0.61–1.24)
GFR calc non Af Amer: >60 mL/min (ref >60)
GLUCOSE: 109 mg/dL — AB (ref 65–99)
Potassium: 3.5 mmol/L (ref 3.5–5.1)
Sodium: 135 mmol/L (ref 135–145)

## 2014-10-14 LAB — EXPECTORATED SPUTUM ASSESSMENT W GRAM STAIN, RFLX TO RESP C: Special Requests: NORMAL

## 2014-10-14 LAB — PROTIME-INR
INR: 1.69
Prothrombin Time: 20.1 seconds — ABNORMAL HIGH (ref 11.4–15.0)

## 2014-10-14 MED ORDER — LEVOFLOXACIN 500 MG PO TABS: 500.0000 mg | ORAL_TABLET | Freq: Every day | ORAL | Status: DC

## 2014-10-14 MED ORDER — AZITHROMYCIN 250 MG PO TABS: 500.0000 mg | ORAL_TABLET | Freq: Every day | ORAL | Status: DC

## 2014-10-14 NOTE — Progress Notes (Signed)
[facility] [place] NAME: [name]    MR#:  [id number]  DATE OF BIRTH:  1950-09-14  SUBJECTIVE:  CHIEF COMPLAINT:   Chief Complaint  Patient presents with  . Hemoptysis  - no further fevers, feels great. Wants to go home. INR subtherapeutic, but CT with no PE, will just discharge on coumadin without bridging   REVIEW OF SYSTEMS:  Review of Systems  Constitutional: Negative for fever and chills.  Respiratory: Positive for cough. Negative for shortness of breath and wheezing.   Cardiovascular: Negative for chest pain and palpitations.  Gastrointestinal: Negative for nausea, vomiting, abdominal pain, diarrhea and constipation.  Genitourinary: Negative for dysuria.  Neurological: Negative for dizziness, seizures and headaches.    DRUG ALLERGIES:  No Known Allergies  VITALS:  Blood pressure 147/81, pulse 75, temperature 98.1 F (36.7 C), temperature source Oral, resp. rate 16, height '6\' 2"'$  (1.88 m), weight 107.956 kg (238 lb), SpO2 99 %.  PHYSICAL EXAMINATION:  Physical Exam  GENERAL: 64 y.o.-year-old patient lying in the bed with no acute distress.  EYES: Pupils equal, round, reactive to light and accommodation. No scleral icterus. Extraocular muscles intact.  HEENT: Right facial droop noted from previous stroke Head atraumatic, normocephalic. Oropharynx and nasopharynx clear.  NECK: Supple, no jugular venous distention. No thyroid enlargement, no tenderness.  LUNGS: Normal breath sounds bilaterally, no wheezing, rales,rhonchi or crepitation. No use of accessory muscles of respiration. Decreased bibasilar breath sounds. CARDIOVASCULAR: S1, S2 normal. No murmurs, rubs, or gallops.  ABDOMEN: Soft, nontender, nondistended. Bowel sounds present. No organomegaly or mass.  EXTREMITIES: No pedal edema, cyanosis, or clubbing.  NEUROLOGIC: Cranial nerves II through XII are intact. Muscle strength 5/5 in all  extremities. Sensation intact. Gait not checked.  PSYCHIATRIC: The patient is alert and oriented x 3.  SKIN: No obvious rash, lesion, or ulcer.   LABORATORY PANEL:   CBC  Recent Labs Lab 10/13/14 0729  WBC 7.9  HGB 11.7*  HCT 35.9*  PLT 120*   ------------------------------------------------------------------------------------------------------------------  Chemistries   Recent Labs Lab 10/12/14 0722  10/14/14 0649  NA 132*  < > 135  K 3.5  < > 3.5  CL 100*  < > 102  CO2 26  < > 25  GLUCOSE 120*  < > 109*  BUN 12  < > 13  CREATININE 0.96  < > 0.75  CALCIUM 8.1*  < > 8.1*  AST 41  --   --   ALT 39  --   --   ALKPHOS 97  --   --   BILITOT 2.0*  --   --   < > = values in this interval not displayed. ------------------------------------------------------------------------------------------------------------------  Cardiac Enzymes  Recent Labs Lab 10/11/14 0921  TROPONINI <0.03   ------------------------------------------------------------------------------------------------------------------  RADIOLOGY:  Ct Angio Chest Pe W/cm &/or Wo Cm  10/12/2014   CLINICAL DATA:  Chest pain for 3 days with increasing shortness of breath  EXAM: CT ANGIOGRAPHY CHEST WITH CONTRAST  TECHNIQUE: Multidetector CT imaging of the chest was performed using the standard protocol during bolus administration of intravenous contrast. Multiplanar CT image reconstructions and MIPs were obtained to evaluate the vascular anatomy.  CONTRAST:  12m OMNIPAQUE IOHEXOL 350 MG/ML SOLN  COMPARISON:  10/11/2014 chest radiograph, chest CT 06/05/2012  FINDINGS: Mediastinum/Nodes: The examination is adequate for evaluation for acute pulmonary embolism up to and including the 3rd order pulmonary arteries. Allowing for a few areas of linear internal presumed flow artifact,  or possible thin linear web formation from remote prior pulmonary emboli, no acute filling defect is identified up to and including the  third order pulmonary arteries to suggest acute pulmonary embolism.  Mild lymphadenopathy is identified with pretracheal node measuring 1.5 cm image 60. Subcarinal lymphadenopathy measures 2.0 cm image 79. Mild cardiomegaly noted with biatrial prominence.  Lungs/Pleura: Subpleural areas of scarring or atelectasis are noted. There is dense consolidation of the right lower lobe with mild bilateral lower lobe bronchial wall thickening. The consolidated segment is perfused. Secretions are noted within the right mainstem bronchus dependently.  Upper abdomen: Mild adrenal prominence without measurable mass may reflect hyperplasia.  Musculoskeletal: Degenerative changes are noted in the spine. No compression deformity or acute osseous abnormality.  Review of the MIP images confirms the above findings.  IMPRESSION: No CT evidence for acute pulmonary embolism up to and including the third order pulmonary arteries.  Dense right lower lobe consolidation most compatible with pneumonia or less likely aspiration.  Triangular areas of presumed subpleural scarring may be related to previous pulmonary emboli/infarct.   Electronically Signed   By: Conchita Paris M.D.   On: 10/12/2014 11:30    EKG:   Orders placed or performed during the hospital encounter of 10/12/14  . ED EKG  . ED EKG  . EKG 12-Lead  . EKG 12-Lead    ASSESSMENT AND PLAN:   Mercedes Valeriano is a 64 y.o. male with a known history of CVA with residual dysphagia, gout, history of DVT and PE on Coumadin, atrial fibrillation comes to the hospital secondary to worsening dyspnea and cough with bloody sputum.  #1 community-acquired pneumonia-failed outpatient treatments with Augmentin and azithromycin.  - Blood cultures are negative from admission -  on Rocephin and azithromycin. Also  DuoNeb's. -sputum culture pending -off o2 - will discharge on levaquin  #2 atrial fibrillation-Paroxysmal Afib- rate well controlled on metoprolol.  -Prior history  of A. fib present.  -On Coumadin. INR is subtherapeutic. - Will discontinue bridging  #3 history of DVT and PE-subtherapeutic INR on Coumadin. CT angiogram with no acute PE.. INR is at 1.6 today levaquin might increase the INR anyways  #4 gout-no current symptoms. Continue colchicine.  #5 history of CVA-residual dysphasia present. At baseline. No new symptoms. On Coumadin. INR is improving.   All the records are reviewed and case discussed with Care Management/Social Workerr. Management plans discussed with the patient, family and they are in agreement.  CODE STATUS: Full Code  TOTAL TIME TAKING CARE OF THIS PATIENT: 36 minutes.   POSSIBLE D/C TODAY, DEPENDING ON CLINICAL CONDITION.   Darci Lykins M.D on 10/14/2014 at 9:01 AM  Between 7am to 6pm - Pager - [phone]  After 6pm go to [email or web address] - password [email or web address] [facility]  [address] [facility]  Office  [phone]  CC: Primary care physician; No PCP Per Patient

## 2014-10-14 NOTE — Discharge Instructions (Signed)

## 2014-10-14 NOTE — Care Management Note (Signed)
Case Management Note  Patient Details  Name: [name] MRN: [id number] Date of Birth: 05-29-1950  Subjective/Objective:                   Patient stated that he is from home alone and has a friend that helps him if need be. He stated that he drives self and that he ambulated 2 laps around nurses station yesterday with girlfriend. Uses a cane at home.  Offered to setup home health PT for patient and patient stated that he feel he is at his normal physical ability and that he does not want Home Health at this time. Discharge today to home with self care.  Action/Plan:   Expected Discharge Date:                  Expected Discharge Plan:  Home/Self Care  In-House Referral:     Discharge planning Services  CM Consult  Post Acute Care Choice:    Choice offered to:     DME Arranged:    DME Agency:     HH Arranged:  NA HH Agency:     Status of Service:     Medicare Important Message Given:  Yes Date Medicare IM Given:  10/14/14 Medicare IM give by:  Theodoro Kalata NCM Date Additional Medicare IM Given:    Additional Medicare Important Message give by:     If discussed at [place] of Stay Meetings, dates discussed:    Additional Comments:  Alvie Heidelberg, RN 10/14/2014, 9:55 AM

## 2014-10-14 NOTE — Care Management Note (Signed)
Case Management Note  Patient Details  Name: [name] MRN: [id number] Date of Birth: [birth year]  Subjective/Objective:                   Soke with the patient ans sibling(sister) again concerning home health . At family urging patient agrees to home health. Choice given and patient chose [facility]. Contacted Tiffany at [facility] for referral. Paged Dr Michail Sermon for orders. Orders for home health placed.  Action/Plan:   Expected Discharge Date:                  Expected Discharge Plan:  [place]  In-House Referral:     Discharge planning Services  CM Consult  Post Acute Care Choice:    Choice offered to:  Patient, Sibling  DME Arranged:    DME Agency:     HH Arranged:  PT [place]:  [facility]  Status of Service:  Completed, signed off  Medicare Important Message Given:  N/A - LOS <3 / Initial given by admissions Date Medicare IM Given:  10/14/14 Medicare IM give by:  Theodoro Kalata NCM Date Additional Medicare IM Given:    Additional Medicare Important Message give by:     If discussed at [place] of Stay Meetings, dates discussed:    Additional Comments:  Alvie Heidelberg, RN 10/14/2014, 10:32 AM

## 2014-10-14 NOTE — Discharge Summary (Signed)
[facility] [place] NAME: [name]    MR#:  [id number]  DATE OF BIRTH:  14-Nov-1950  DATE OF ADMISSION:  10/12/2014 ADMITTING PHYSICIAN: Gladstone Lighter, MD  DATE OF DISCHARGE: 10/14/2014  PRIMARY CARE PHYSICIAN: Dr. Ladoris Gene   ADMISSION DIAGNOSIS:  CAP (community acquired pneumonia) [J18.9] Cough with hemoptysis [R04.2]  DISCHARGE DIAGNOSIS:  Active Problems:   Pneumonia   SECONDARY DIAGNOSIS:   Past Medical History  Diagnosis Date  . History of deep vein thrombosis 2006  . Hypertension   . Gout current and history of  . History of tobacco abuse     has smoked for 50 years  . Cocaine abuse     + UDS on admission  . Stroke   . Atrial fibrillation   . Pulmonary embolism     HOSPITAL COURSE:   [name] is a 64 y.o. male with a known history of CVA with residual dysphagia, gout, history of DVT and PE on Coumadin, atrial fibrillation comes to the hospital secondary to worsening dyspnea and cough with bloody sputum.  #1 community-acquired pneumonia-failed outpatient treatments with Augmentin and azithromycin.  - Blood cultures have been drawn from the emergency room. Fever last evening - on Rocephin and azithromycin. Also DuoNeb's. -With his sputum and hemoptysis, - sputum cultures have been ordered. -Oxygen support as needed. Monitor at this time. - If continues to spike fevers today- will change ABX to zosyn to cover for pseudomonas  #2 atrial fibrillation-rate well controlled on metoprolol. Prior history of A. fib present. On Coumadin. INR is subtherapeutic. Being bridged with Lovenox.  #3 history of DVT and PE-subtherapeutic INR on Coumadin. On Lovenox therapeutic dose to bridge. CT angiogram with no acute PE.. INR is at 1.5 today  #4 gout-no current symptoms. Continue colchicine.  #5 history of CVA-residual dysphasia present. At baseline. No new symptoms. On Coumadin. INR is  subtherapeutic.  DISCHARGE CONDITIONS:   stable  CONSULTS OBTAINED:     DRUG ALLERGIES:  No Known Allergies  DISCHARGE MEDICATIONS:   Current Discharge Medication List    START taking these medications   Details  levofloxacin (LEVAQUIN) 500 MG tablet Take 1 tablet (500 mg total) by mouth daily. Qty: 5 tablet, Refills: 0      CONTINUE these medications which have NOT CHANGED   Details  colchicine 0.6 MG tablet Take 0.6 mg by mouth daily as needed (for gout).     metoprolol tartrate (LOPRESSOR) 25 MG tablet Take 25 mg by mouth 2 (two) times daily.    warfarin (COUMADIN) 6 MG tablet Take 6 mg by mouth daily. Every evening at 5 pm      STOP taking these medications     azithromycin (ZITHROMAX Z-PAK) 250 MG tablet          DISCHARGE INSTRUCTIONS:   1. PCP f/u in 1 week 2. INR check tomorrow scheduled at his clinic on [address]   If you experience worsening of your admission symptoms, develop shortness of breath, life threatening emergency, suicidal or homicidal thoughts you must seek medical attention immediately by calling 911 or calling your MD immediately  if symptoms less severe.  You Must read complete instructions/literature along with all the possible adverse reactions/side effects for all the Medicines you take and that have been prescribed to you. Take any new Medicines after you have completely understood and accept all the possible adverse reactions/side effects.   Please note  You were cared for by  a hospitalist during your hospital stay. If you have any questions about your discharge medications or the care you received while you were in the hospital after you are discharged, you can call the unit and asked to speak with the hospitalist on call if the hospitalist that took care of you is not available. Once you are discharged, your primary care physician will handle any further medical issues. Please note that NO REFILLS for any discharge medications will  be authorized once you are discharged, as it is imperative that you return to your primary care physician (or establish a relationship with a primary care physician if you do not have one) for your aftercare needs so that they can reassess your need for medications and monitor your lab values.    Today   CHIEF COMPLAINT:   Chief Complaint  Patient presents with  . Hemoptysis     VITAL SIGNS:  Blood pressure 147/81, pulse 75, temperature 98.1 F (36.7 C), temperature source Oral, resp. rate 16, height '6\' 2"'$  (1.88 m), weight 107.956 kg (238 lb), SpO2 99 %.  I/O:   Intake/Output Summary (Last 24 hours) at 10/14/14 0909 Last data filed at 10/14/14 0838  Gross per 24 hour  Intake    720 ml  Output    650 ml  Net     70 ml    PHYSICAL EXAMINATION:   Physical Exam  GENERAL: 64 y.o.-year-old patient lying in the bed with no acute distress.  EYES: Pupils equal, round, reactive to light and accommodation. No scleral icterus. Extraocular muscles intact.  HEENT: Right facial droop noted from previous stroke Head atraumatic, normocephalic. Oropharynx and nasopharynx clear.  NECK: Supple, no jugular venous distention. No thyroid enlargement, no tenderness.  LUNGS: Normal breath sounds bilaterally, no wheezing, rales,rhonchi or crepitation. No use of accessory muscles of respiration. Decreased bibasilar breath sounds. Right basilar rhonchi. CARDIOVASCULAR: S1, S2 normal. No murmurs, rubs, or gallops.  ABDOMEN: Soft, nontender, nondistended. Bowel sounds present. No organomegaly or mass.  EXTREMITIES: No pedal edema, cyanosis, or clubbing.  NEUROLOGIC: Cranial nerves II through XII are intact. Muscle strength 5/5 in all extremities. Sensation intact. Gait not checked.  PSYCHIATRIC: The patient is alert and oriented x 3.  SKIN: No obvious rash, lesion, or ulcer.   DATA REVIEW:   CBC  Recent Labs Lab 10/13/14 0729  WBC 7.9  HGB 11.7*  HCT 35.9*  PLT 120*     Chemistries   Recent Labs Lab 10/12/14 0722  10/14/14 0649  NA 132*  < > 135  K 3.5  < > 3.5  CL 100*  < > 102  CO2 26  < > 25  GLUCOSE 120*  < > 109*  BUN 12  < > 13  CREATININE 0.96  < > 0.75  CALCIUM 8.1*  < > 8.1*  AST 41  --   --   ALT 39  --   --   ALKPHOS 97  --   --   BILITOT 2.0*  --   --   < > = values in this interval not displayed.  Cardiac Enzymes  Recent Labs Lab 10/11/14 0921  TROPONINI <0.03    Microbiology Results  Results for orders placed or performed during the hospital encounter of 10/12/14  Blood culture (routine x 2)     Status: None (Preliminary result)   Collection Time: 10/12/14  7:50 AM  Result Value Ref Range Status   Specimen Description BLOOD  Final   Special Requests  Normal  Final   Culture NO GROWTH 2 DAYS  Final   Report Status PENDING  Incomplete  Blood culture (routine x 2)     Status: None (Preliminary result)   Collection Time: 10/12/14  7:50 AM  Result Value Ref Range Status   Specimen Description BLOOD  Final   Special Requests Normal  Final   Culture NO GROWTH 2 DAYS  Final   Report Status PENDING  Incomplete  Culture, expectorated sputum-assessment     Status: None (Preliminary result)   Collection Time: 10/13/14 10:55 AM  Result Value Ref Range Status   Specimen Description SPUTUM  Final   Special Requests Normal  Final   Sputum evaluation THIS SPECIMEN IS ACCEPTABLE FOR SPUTUM CULTURE  Final   Report Status PENDING  Incomplete    RADIOLOGY:  Ct Angio Chest Pe W/cm &/or Wo Cm  10/12/2014   CLINICAL DATA:  Chest pain for 3 days with increasing shortness of breath  EXAM: CT ANGIOGRAPHY CHEST WITH CONTRAST  TECHNIQUE: Multidetector CT imaging of the chest was performed using the standard protocol during bolus administration of intravenous contrast. Multiplanar CT image reconstructions and MIPs were obtained to evaluate the vascular anatomy.  CONTRAST:  185m OMNIPAQUE IOHEXOL 350 MG/ML SOLN  COMPARISON:  10/11/2014  chest radiograph, chest CT 06/05/2012  FINDINGS: Mediastinum/Nodes: The examination is adequate for evaluation for acute pulmonary embolism up to and including the 3rd order pulmonary arteries. Allowing for a few areas of linear internal presumed flow artifact, or possible thin linear web formation from remote prior pulmonary emboli, no acute filling defect is identified up to and including the third order pulmonary arteries to suggest acute pulmonary embolism.  Mild lymphadenopathy is identified with pretracheal node measuring 1.5 cm image 60. Subcarinal lymphadenopathy measures 2.0 cm image 79. Mild cardiomegaly noted with biatrial prominence.  Lungs/Pleura: Subpleural areas of scarring or atelectasis are noted. There is dense consolidation of the right lower lobe with mild bilateral lower lobe bronchial wall thickening. The consolidated segment is perfused. Secretions are noted within the right mainstem bronchus dependently.  Upper abdomen: Mild adrenal prominence without measurable mass may reflect hyperplasia.  Musculoskeletal: Degenerative changes are noted in the spine. No compression deformity or acute osseous abnormality.  Review of the MIP images confirms the above findings.  IMPRESSION: No CT evidence for acute pulmonary embolism up to and including the third order pulmonary arteries.  Dense right lower lobe consolidation most compatible with pneumonia or less likely aspiration.  Triangular areas of presumed subpleural scarring may be related to previous pulmonary emboli/infarct.   Electronically Signed   By: GConchita ParisM.D.   On: 10/12/2014 11:30    EKG:   Orders placed or performed during the hospital encounter of 10/12/14  . ED EKG  . ED EKG  . EKG 12-Lead  . EKG 12-Lead      Management plans discussed with the patient, family and they are in agreement.  CODE STATUS:     Code Status Orders        Start     Ordered   10/12/14 0954  Full code   Continuous     10/12/14 0953       TOTAL TIME TAKING CARE OF THIS PATIENT: 38 minutes.    Rustyn Conery M.D on 10/14/2014 at 9:09 AM  Between 7am to 6pm - Pager - [phone]  After 6pm go to [email or web address] - password [email or web address] [facility]  Office  [phone] CC: Primary care  physician; No PCP Per Patient

## 2014-10-17 LAB — CULTURE, BLOOD (ROUTINE X 2)
CULTURE: NO GROWTH
CULTURE: NO GROWTH
Special Requests: NORMAL
Special Requests: NORMAL

## 2014-10-17 LAB — CULTURE, RESPIRATORY W GRAM STAIN
Culture: NORMAL
Special Requests: NORMAL

## 2015-01-06 ENCOUNTER — Encounter: Payer: Self-pay | Admitting: Emergency Medicine

## 2015-01-06 ENCOUNTER — Emergency Department
Admission: EM | Admit: 2015-01-06 | Discharge: 2015-01-06 | Disposition: A | Discharge status: Home / Self Care | Payer: Medicare Other | Attending: Emergency Medicine | Admitting: Emergency Medicine

## 2015-01-06 DIAGNOSIS — I83018 Varicose veins of right lower extremity with ulcer other part of lower leg: Secondary | ICD-10-CM | POA: Insufficient documentation

## 2015-01-06 DIAGNOSIS — L97919 Non-pressure chronic ulcer of unspecified part of right lower leg with unspecified severity: Secondary | ICD-10-CM | POA: Insufficient documentation

## 2015-01-06 DIAGNOSIS — Z4800 Encounter for change or removal of nonsurgical wound dressing: Secondary | ICD-10-CM | POA: Diagnosis present

## 2015-01-06 DIAGNOSIS — I83019 Varicose veins of right lower extremity with ulcer of unspecified site: Secondary | ICD-10-CM

## 2015-01-06 MED ORDER — TRAMADOL HCL 50 MG PO TABS: 50.0000 mg | ORAL_TABLET | Freq: Four times a day (QID) | ORAL | Status: DC | PRN

## 2015-01-06 MED ORDER — BACITRACIN ZINC 500 UNIT/GM EX OINT
TOPICAL_OINTMENT | Freq: Two times a day (BID) | CUTANEOUS | Status: DC
  Administered 2015-01-06: 1 via TOPICAL
  Filled 2015-01-06: qty 0.9

## 2015-01-06 MED ORDER — CLINDAMYCIN HCL 300 MG PO CAPS: 300.0000 mg | ORAL_CAPSULE | Freq: Three times a day (TID) | ORAL | Status: DC

## 2015-01-06 NOTE — ED Notes (Signed)
Pt has been having right lower leg swelling and pain for a week now, has an opened wound to his right inner lower leg that has recently "opened up" after being treated 4 years ago. Site noted to be the size of an eraser head, oozing white discharge.

## 2015-01-06 NOTE — Discharge Instructions (Signed)
Stasis Ulcer  A stasis ulcer is a sore on the skin. It occurs in the legs when your blood flow is damaged.  HOME CARE  Do not stand or sit in one position for a long time. Do not sit with your legs crossed. Raise (elevate) your legs.  Wear elastic stockings (compression stockings). Do not wear tight clothing around the legs or waist area. Use and apply bandages (dressings) as told.  Walk as much as possible. If you take long car or plane rides, take a break to walk around every 2 hours.  Only take medicine as told by your doctor.  Raise the end of your bed with 2-inch blocks only if your doctor says it is okay.  If you cut the skin on your leg, lie down and raise your leg. Gently clean the area with a clean cloth. Then, put pressure on the cut until the bleeding stops. Put a bandage on.  Keep all doctor visits. GET HELP RIGHT AWAY IF:  The ulcer area starts to break down.  You have pain, redness, or tenderness in or around the ulcer.  You have yellowish-white fluid (pus) or hard puffiness (swelling) in or around the ulcer.  Your pain gets worse.  You get a fever.  You have chest pain or shortness of breath. MAKE SURE YOU:  Understand these instructions.  Will watch your condition.  Will get help right away if you are not doing well or get worse. Document Released: 06/10/2004 Document Revised: 08/28/2012 Document Reviewed: 09/01/2010 [facility] Patient Information 2015 [place], [place]. This information is not intended to replace advice given to you by your health care provider. Make sure you discuss any questions you have with your health care provider.

## 2015-01-06 NOTE — ED Provider Notes (Signed)
[facility] Emergency Department Provider Note ____________________________________________  Time seen: Approximately 8:55 AM  I have reviewed the triage vital signs and the nursing notes.   HISTORY  Chief Complaint Wound Check   HPI [name] is a 63 y.o. male presents to the emergency department for evaluation of a wound to his right lower leg. He denies known injury. He denies initially having a bump or abscess. He states the area is draining a foul-smelling liquid. He states that initially it was not painful however it has started to become very tender.   Past Medical History  Diagnosis Date  . History of deep vein thrombosis 2006  . Hypertension   . Gout current and history of  . History of tobacco abuse     has smoked for 50 years  . Cocaine abuse     + UDS on admission  . Stroke   . Atrial fibrillation   . Pulmonary embolism     Patient Active Problem List   Diagnosis Date Noted  . Pneumonia 10/12/2014  . CVA (cerebral infarction) 04/26/2011  . Diabetes mellitus 04/26/2011  . 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 Anemia 04/26/2011  . DVT (deep venous thrombosis) 04/26/2011    Past Surgical History  Procedure Laterality Date  . Vascular surgery      Stent in leg    Current Outpatient Rx  Name  Route  Sig  Dispense  Refill  . colchicine 0.6 MG tablet   Oral   Take 0.6 mg by mouth daily as needed (for gout).          . metoprolol tartrate (LOPRESSOR) 25 MG tablet   Oral   Take 25 mg by mouth 2 (two) times daily.         . simvastatin (ZOCOR) 20 MG tablet   Oral   Take 20 mg by mouth daily.         [name] warfarin (COUMADIN) 6 MG tablet   Oral   Take 6 mg by mouth daily. Every evening at 5 pm         . clindamycin (CLEOCIN) 300 MG capsule   Oral   Take 1 capsule (300 mg total) by mouth 3 (three) times daily.   30 capsule   0   . levofloxacin (LEVAQUIN) 500 MG tablet   Oral   Take 1 tablet (500 mg  total) by mouth daily.   5 tablet   0   . traMADol (ULTRAM) 50 MG tablet   Oral   Take 1 tablet (50 mg total) by mouth every 6 (six) hours as needed.   15 tablet   0     Allergies Review of patient's allergies indicates no known allergies.  Family History  Problem Relation Age of Onset  . Aneurysm Mother   . Diabetes Father     Social History Social History  Substance Use Topics  . Smoking status: Current Every Day Smoker -- 0.50 packs/day for 50 years    Types: Cigarettes  . Smokeless tobacco: Never Used  . Alcohol Use: No    Review of Systems   Constitutional: No fever/chills Eyes: No visual changes. ENT: No congestion or rhinorrhea Cardiovascular: Denies chest pain. Respiratory: Denies shortness of breath. Gastrointestinal: No abdominal pain.  No nausea, no vomiting.  No diarrhea.  No constipation. Genitourinary: Negative for dysuria. Musculoskeletal: Negative for back pain. Skin: Wound to the right lower leg Neurological: Negative for headaches, focal weakness or numbness.  10-point ROS otherwise  negative.  ____________________________________________   PHYSICAL EXAM:  VITAL SIGNS: ED Triage Vitals  Enc Vitals Group     BP 01/06/15 0813 161/102 mmHg     Pulse Rate 01/06/15 0813 40     Resp 01/06/15 0813 18     Temp 01/06/15 0813 97.6 F (36.4 C)     Temp Source 01/06/15 0813 Oral     SpO2 01/06/15 0813 100 %     Weight 01/06/15 0813 210 lb (95.255 kg)     Height 01/06/15 0813 '6\' 2"'$  (1.88 m)     Head Cir --      Peak Flow --      Pain Score 01/06/15 0820 9     Pain Loc --      Pain Edu? --      Excl. in [place]? --     Constitutional: Alert and oriented. Well appearing and in no acute distress. Eyes: Conjunctivae are normal. PERRL. EOMI. Head: Atraumatic. Nose: No congestion/rhinnorhea. Mouth/Throat: Mucous membranes are moist.  Oropharynx non-erythematous. No oral lesions. Neck: No stridor. Cardiovascular: Normal rate, regular rhythm.   Chronic vascular disease with skin changes of the lower extremities. Respiratory: Normal respiratory effort.  No retractions. Lungs CTAB. Gastrointestinal: Soft and nontender. No distention. No abdominal bruits.  Musculoskeletal: Bilateral lower extremity peripheral edema, 3+. Neurologic:  Baseline post CVA speech and language.  Skin:  Ulceration noted on the right medial aspect of the right lower extremity above the ankle draining light yellow fluid, no obvious cellulitis surrounds the lesion; Negative for petechiae.  Psychiatric: Mood and affect are normal. Speech and behavior are normal.  ____________________________________________   LABS (all labs ordered are listed, but only abnormal results are displayed)  Labs Reviewed - No data to display ____________________________________________  EKG  ____________________________________________  RADIOLOGY  Not indicated ____________________________________________   PROCEDURES  Procedure(s) performed: None ____________________________________________   INITIAL IMPRESSION / ASSESSMENT AND PLAN / ED COURSE  Pertinent labs & imaging results that were available during my care of the patient were reviewed by me and considered in my medical decision making (see chart for details).  Patient was strongly encouraged to follow up with his primary care provider or the vein and vascular doctor that he saw at [facility]. He was advised to take the antibiotic until finished. He was advised to return to the emergency department for symptoms that change or worsen if he is unable schedule an appointment. ____________________________________________   FINAL CLINICAL IMPRESSION(S) / ED DIAGNOSES  Final diagnoses:  Venous stasis ulcer of right lower extremity      Victorino Dike, FNP 01/06/15 1351  Daymon Larsen, MD 01/06/15 1431

## 2015-01-06 NOTE — ED Notes (Signed)
Pt states he has been putting cream on it leftover from [place] when they took care of it the first time. Pt appears in no distress.

## 2015-01-27 ENCOUNTER — Encounter: Payer: Medicare Other | Attending: Surgery | Admitting: Surgery

## 2015-01-27 DIAGNOSIS — F17209 Nicotine dependence, unspecified, with unspecified nicotine-induced disorders: Secondary | ICD-10-CM | POA: Insufficient documentation

## 2015-01-27 DIAGNOSIS — I10 Essential (primary) hypertension: Secondary | ICD-10-CM | POA: Insufficient documentation

## 2015-01-27 DIAGNOSIS — L97312 Non-pressure chronic ulcer of right ankle with fat layer exposed: Secondary | ICD-10-CM | POA: Diagnosis not present

## 2015-01-27 DIAGNOSIS — I83213 Varicose veins of right lower extremity with both ulcer of ankle and inflammation: Secondary | ICD-10-CM | POA: Diagnosis not present

## 2015-01-27 DIAGNOSIS — G629 Polyneuropathy, unspecified: Secondary | ICD-10-CM | POA: Insufficient documentation

## 2015-01-27 DIAGNOSIS — M109 Gout, unspecified: Secondary | ICD-10-CM | POA: Diagnosis not present

## 2015-01-28 NOTE — Progress Notes (Signed)
JESS, [name] ([id number]) Visit Report for 01/27/2015 Chief Complaint Document Details Patient Name: [name], [name] Date of Service: 01/27/2015 2:30 PM Medical Record Number: [id number] Patient Account Number: [id number] Date of Birth/Sex: [birth year] (64 y.o. Male) Treating RN: Montey Hora Primary Care Physician: PATIENT, NO Other Clinician: Referring Physician: Royetta Crochet Treating Physician/Extender: Frann Rider in Treatment: 0 Information Obtained from: Patient Chief Complaint Patient presents for treatment of an open ulcer due to venous insufficiency. He says he noticed a "into the right medial ankle for about 3-4 weeks Electronic Signature(s) Signed: 01/27/2015 3:48:25 PM By: Christin Fudge MD, FACS Entered By: Christin Fudge on 01/27/2015 15:48:25 Enfield, [name] ([id number]) -------------------------------------------------------------------------------- HPI Details Patient Name: [name] Date of Service: 01/27/2015 2:30 PM Medical Record Number: [id number] Patient Account Number: [id number] Date of Birth/Sex: 07/03/1950 (64 y.o. Male) Treating RN: Montey Hora Primary Care Physician: PATIENT, NO Other Clinician: Referring Physician: Royetta Crochet Treating Physician/Extender: Frann Rider in Treatment: 0 History of Present Illness Location: open wound just above his right ankle Quality: Patient reports experiencing a dull pain to affected area(s). Severity: Patient states wound are getting worse. Duration: Patient has had the wound for < 4 weeks prior to presenting for treatment Timing: Pain in wound is Intermittent (comes and goes Context: The wound appeared gradually over time Modifying Factors: Consults to this date include: he was seen in the ER and was referred to a vascular surgeon but the patient has not done that. He may have been treated with clindamycin in the ER. Associated Signs and Symptoms: Patient reports having  difficulty standing for long periods. HPI Description: 64 year old gentleman who was seen in the emergency department recently on 01/06/2015 for a wound of his right lower extremity which he says was not involving any injury and he did not know how he sustained it. He had draining foul-smelling liquid from the area and had gone for care there. his past medical history is significant for DVT, hypertension, gout, tobacco abuse, cocaine abuse, stroke, atrial fibrillation, pulmonary embolism. he has also had some vascular surgery with a stent placed in his leg. He has been a smoker for many years and has given up straight drugs several years ago. He continues to smoke about 4-5 cigarettes a day Electronic Signature(s) Signed: 01/27/2015 3:50:38 PM By: Christin Fudge MD, FACS Previous Signature: 01/27/2015 3:04:54 PM Version By: Christin Fudge MD, FACS Entered By: Christin Fudge on 01/27/2015 15:50:38 Vences, [name] ([id number]) -------------------------------------------------------------------------------- Physical Exam Details Patient Name: [name] Date of Service: 01/27/2015 2:30 PM Medical Record Number: [id number] Patient Account Number: [id number] Date of Birth/Sex: Sep 09, 1950 (64 y.o. Male) Treating RN: Montey Hora Primary Care Physician: PATIENT, NO Other Clinician: Referring Physician: Royetta Crochet Treating Physician/Extender: Frann Rider in Treatment: 0 Constitutional . Pulse regular. Respirations normal and unlabored. Afebrile. . Eyes Nonicteric. Reactive to light. Ears, Nose, Mouth, and Throat Lips, teeth, and gums WNL.[name] Moist mucosa without lesions . Neck supple and nontender. No palpable supraclavicular or cervical adenopathy. Normal sized without goiter. Respiratory WNL. No retractions.. Breath sounds WNL, No rubs, rales, rhonchi, or wheeze.. Cardiovascular Heart rhythm and rate regular, no murmur or gallop.. Pedal Pulses WNL. ABI on the left was  1.22 in the right was 1.15. he has significant pedal edema both lower extremities. Gastrointestinal (GI) Abdomen without masses or tenderness.. No liver or spleen enlargement or tenderness.. Lymphatic No adneopathy. No adenopathy. No adenopathy. Musculoskeletal Adexa without tenderness or enlargement.. Digits and nails w/o clubbing, cyanosis, infection, petechiae, ischemia, or inflammatory conditions.. Integumentary (Hair,  Skin) No suspicious lesions. No crepitus or fluctuance. No peri-wound warmth or erythema. No masses.[name] Psychiatric Judgement and insight Intact.. No evidence of depression, anxiety, or agitation.. Notes The patient has an open ulceration at the medial right ankle and has signs and symptoms of venous hypertension. The ulcer is down to subcutaneous tissue and is fairly clean. Electronic Signature(s) Signed: 01/27/2015 3:51:33 PM By: Christin Fudge MD, FACS Entered By: Christin Fudge on 01/27/2015 15:51:33 Shawley, [name] ([id number]) -------------------------------------------------------------------------------- Physician Orders Details Patient Name: [name] Date of Service: 01/27/2015 2:30 PM Medical Record Number: [id number] Patient Account Number: [id number] Date of Birth/Sex: Jul 20, 1950 (64 y.o. Male) Treating RN: Montey Hora Primary Care Physician: PATIENT, NO Other Clinician: Referring Physician: Royetta Crochet Treating Physician/Extender: Frann Rider in Treatment: 0 Verbal / Phone Orders: Yes Clinician: Montey Hora Read Back and Verified: Yes Diagnosis Coding Wound Cleansing Wound #1 Right,Distal,Medial Lower Leg o Clean wound with Normal Saline. Anesthetic Wound #1 Right,Distal,Medial Lower Leg o Topical Lidocaine 4% cream applied to wound bed prior to debridement Primary Wound Dressing Wound #1 Right,Distal,Medial Lower Leg o Aquacel Ag Secondary Dressing Wound #1 Right,Distal,Medial Lower Leg o ABD pad Dressing  Change Frequency Wound #1 Right,Distal,Medial Lower Leg o Change dressing every week Follow-up Appointments Wound #1 Right,Distal,Medial Lower Leg o Return Appointment in 1 week. o Nurse Visit as needed Edema Control Wound #1 Right,Distal,Medial Lower Leg o 2 Layer Compression System - Right Lower Extremity Services and Therapies o Venous Studies -Bilateral Lorenson, Khiem ([id number]) oooo Electronic Signature(s) Signed: 01/27/2015 4:01:57 PM By: Christin Fudge MD, FACS Signed: 01/27/2015 4:36:49 PM By: Montey Hora Entered By: Montey Hora on 01/27/2015 15:27:00 Carolan, [name] ([id number]) -------------------------------------------------------------------------------- Problem List Details Patient Name: [name] Date of Service: 01/27/2015 2:30 PM Medical Record Number: [id number] Patient Account Number: [id number] Date of Birth/Sex: 16-Jul-1950 (64 y.o. 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 Male) Treating RN: Montey Hora Primary Care Physician: PATIENT, NO Other Clinician: Referring Physician: Royetta Crochet Treating Physician/Extender: Frann Rider in Treatment: 0 Subjective Chief Complaint Information obtained from Patient Patient presents for treatment of an open ulcer due to venous insufficiency. He says he noticed a "into the right medial ankle for about 3-4 weeks History of Present Illness (HPI) The following HPI elements were documented for the patient's wound: Location: open wound just above his right ankle Quality: Patient reports experiencing a dull pain to affected area(s). Severity: Patient states wound are getting worse. Duration: Patient has had the wound for < 4 weeks prior to presenting for treatment Timing: Pain in wound is Intermittent (comes and goes Context: The wound appeared gradually over time Modifying Factors: Consults to this date include: he was seen in the ER and was referred to a vascular surgeon  but the patient has not done that. He may have been treated with clindamycin in the ER. Associated Signs and Symptoms: Patient reports having difficulty standing for long periods. 64 year old gentleman who was seen in the emergency department recently on 01/06/2015 for a wound of his right lower extremity which he says was not involving any injury and he did not know how he sustained it. He had draining foul-smelling liquid from the area and had gone for care there. his past medical history is significant for DVT, hypertension, gout, tobacco abuse, cocaine abuse, stroke, atrial fibrillation, pulmonary embolism. he has also had some vascular surgery with a stent placed in his leg. He has been a smoker for many years and has given up straight drugs several years ago. He continues to smoke about 4-5 cigarettes a day Wound  History Patient presents with 1 open wound that has been present for approximately 3 weeks. Patient has been treating wound in the following manner: antibiotic ointment/gauze/kerlix. Laboratory tests have been performed in the last month. Patient reportedly has not tested positive for an antibiotic resistant organism. Patient reportedly has not tested positive for osteomyelitis. Patient reportedly has not had testing performed to evaluate circulation in the legs. Patient experiences the following problems associated with their wounds: swelling. Patient History Information obtained from Patient. Dondlinger, [name] ([id number]) Allergies No Known Allergies Social History Current every day smoker, Marital Status - Single, Alcohol Use - Never, Drug Use - No History, Caffeine Use - Daily. Medical History Cardiovascular Patient has history of Arrhythmia - a fib, Hypertension Musculoskeletal Patient has history of Gout Neurologic Patient has history of Neuropathy Oncologic Denies history of Received Chemotherapy, Received Radiation Hospitalization/Surgery History - 05/17/2012, [facility], CVA. Medical And Surgical History Notes Ear/Nose/Mouth/Throat difficulty speaking Review of Systems (ROS) Constitutional Symptoms (General Health) The patient has no complaints or symptoms. Eyes The patient has no complaints or symptoms. Hematologic/Lymphatic The patient has no complaints or symptoms. Respiratory The patient has no complaints or symptoms. Cardiovascular Complains or has symptoms of LE edema. Gastrointestinal The patient has no complaints or symptoms. Endocrine The patient has no complaints or symptoms. Genitourinary The patient has no complaints or symptoms. Immunological The patient has no complaints or symptoms. Integumentary (Skin) The patient has no complaints or symptoms. Psychiatric The patient has no complaints or symptoms. Gomer, [name]  ([id number]) Medications hydrocodone 10 mg-acetaminophen 300 mg tablet oral 1 1 tablet oral Coumadin 6 mg tablet oral 1 1 tablet oral simvastatin 20 mg tablet oral 1 1 tablet oral metoprolol tartrate 100 mg tablet oral 1 1 tablet oral Colcrys 0.6 mg tablet oral 1 1 tablet oral furosemide 40 mg tablet oral 1 1 tablet oral Objective Constitutional Pulse regular. Respirations normal and unlabored. Afebrile. Vitals Time Taken: 2:37 PM, Height: 74 in, Source: Stated, Weight: 238 lbs, Source: Stated, BMI: 30.6, Temperature: 98.3 F, Pulse: 47 bpm, Respiratory Rate: 20 breaths/min, Blood Pressure: 140/81 mmHg. Eyes Nonicteric. Reactive to light. Ears, Nose, Mouth, and Throat Lips, teeth, and gums WNL.[name] Moist mucosa without lesions . Neck supple and nontender. No palpable supraclavicular or cervical adenopathy. Normal sized without goiter. Respiratory WNL. No retractions.. Breath sounds WNL, No rubs, rales, rhonchi, or wheeze.. Cardiovascular Heart rhythm and rate regular, no murmur or gallop.. Pedal Pulses WNL. ABI on the left was 1.22 in the right was 1.15. he has significant pedal edema both lower extremities. Gastrointestinal (GI) Abdomen without masses or tenderness.. No liver or spleen enlargement or tenderness.. Lymphatic No adneopathy. No adenopathy. No adenopathy. Musculoskeletal Adexa  without tenderness or enlargement.. Digits and nails w/o clubbing, cyanosis, infection, petechiae, ischemia, or inflammatory conditions.[name] Psychiatric Widen, [name] ([id number]) Judgement and insight Intact.. No evidence of depression, anxiety, or agitation.. General Notes: The patient has an open ulceration at the medial right ankle and has signs and symptoms of venous hypertension. The ulcer is down to subcutaneous tissue and is fairly clean. Integumentary (Hair, Skin) No suspicious lesions. No crepitus or fluctuance. No peri-wound warmth or erythema. No masses.. Wound #1 status is Open.  Original cause of wound was Gradually Appeared. The wound is located on the Right,Distal,Medial Lower Leg. The wound measures 4.6cm length x 2.3cm width x 0.4cm depth; 8.31cm^2 area and 3.324cm^3 volume. The wound is limited to skin breakdown. There is no tunneling or undermining noted. There is a medium amount of serous drainage noted. The wound margin is flat and intact. There is medium (34-66%) red, pink granulation within the wound bed. There is a medium (34-66%) amount of necrotic tissue within the wound bed including Eschar and Adherent Slough. The periwound skin appearance did not exhibit: Callus, Crepitus, Excoriation, Fluctuance, Friable, Induration, Localized Edema, Rash, Scarring, Dry/Scaly, Maceration, Moist, Atrophie Blanche, Cyanosis, Ecchymosis, Hemosiderin Staining, Mottled, Pallor, Rubor, Erythema. Periwound temperature was noted as No Abnormality. The periwound has tenderness on palpation. 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 I have recommended silver alginate and a 2 layer wrap to start off with. There was a question of whether he had had a arterial stent placed by the vascular services at [facility] several years ago. We'll try and obtain these reports. I'm also going to recommend venous duplex studies of both lower extremities and an appropriate referral if indicated. He understands elevation of the limb as much as possible and I have also spent a great deal of time discussing quitting smoking and  have discussed various options regarding this. Come back and see me next week. Heiden, [name] ([id number]) Plan Wound Cleansing: Wound #1 Right,Distal,Medial Lower Leg: Clean wound with Normal Saline. Anesthetic: Wound #1 Right,Distal,Medial Lower Leg: Topical Lidocaine 4% cream applied to wound bed prior to debridement Primary Wound Dressing: Wound #1 Right,Distal,Medial Lower Leg: Aquacel Ag Secondary Dressing: Wound #1 Right,Distal,Medial Lower Leg: ABD pad Dressing Change Frequency: Wound #1 Right,Distal,Medial Lower Leg: Change dressing every week Follow-up Appointments: Wound #1 Right,Distal,Medial Lower Leg: Return Appointment in 1 week. Nurse Visit as needed Edema Control: Wound #1 Right,Distal,Medial Lower Leg: 2 Layer Compression System - Right Lower Extremity Services and Therapies ordered were: Venous Studies -Bilateral The patient has signs and symptoms of venous hypertension, varicose veins and inflammation with ulceration of the right lower extremity near the ankle. I have recommended silver alginate and a 2 layer wrap to start off with. There was a question of whether he had had a arterial stent placed by the vascular services at [facility] several years ago. We'll try and obtain these reports. I'm also going to recommend venous duplex studies of both lower extremities and an appropriate referral if indicated. He understands elevation of the limb as much as possible and I have also spent a great deal of  time discussing quitting smoking and have discussed various options regarding this. Come back and see me next week. Electronic Signature(s) Signed: 01/27/2015 3:53:05 PM By: Christin Fudge MD, FACS Entered By: Christin Fudge on 01/27/2015 15:53:05 Adams, [name] ([id number]) Rumpf, [name] ([id number]) -------------------------------------------------------------------------------- ROS/PFSH Details Patient Name: [name] Date of  Service: 01/27/2015 2:30 PM Medical Record Number: [id number] Patient Account Number: [id number] Date of Birth/Sex: March 13, 1951 (64 y.o. Male) Treating RN: Montey Hora Primary Care Physician: PATIENT, NO Other Clinician: Referring Physician: Royetta Crochet Treating Physician/Extender: Frann Rider in Treatment: 0 Information Obtained From Patient Wound History Do you currently have one or more open woundso Yes How many open wounds do you currently haveo 1 Approximately how long have you had your woundso 3 weeks How have you been treating your wound(s) until nowo antibiotic ointment/gauze/kerlix Has your wound(s) ever healed and then re-openedo No Have you had any lab work done in the past montho Yes Who ordered the lab work doneo PCP - INR Have you tested positive for an antibiotic resistant organism (MRSA, No VRE)o Have you tested positive for osteomyelitis (bone infection)o No Have you had any tests for circulation on your legso No Have you had other problems associated with your woundso Swelling Cardiovascular Complaints and Symptoms: Positive for: LE edema Medical History: Positive for: Arrhythmia - a fib; Hypertension Constitutional Symptoms (General Health) Complaints and Symptoms: No Complaints or Symptoms Eyes Complaints and Symptoms: No Complaints or Symptoms Ear/Nose/Mouth/Throat Medical History: Past Medical History Notes: difficulty speaking Saggese, [name] ([id number]) Hematologic/Lymphatic Complaints and Symptoms: No Complaints or Symptoms Respiratory Complaints and Symptoms: No Complaints or Symptoms Gastrointestinal Complaints and Symptoms: No Complaints or Symptoms Endocrine Complaints and Symptoms: No Complaints or Symptoms Genitourinary Complaints and Symptoms: No Complaints or Symptoms Immunological Complaints and Symptoms: No Complaints or Symptoms Integumentary (Skin) Complaints and Symptoms: No Complaints or  Symptoms Musculoskeletal Medical History: Positive for: Gout Neurologic Medical History: Positive for: Neuropathy Oncologic Medical History: Negative for: Received Chemotherapy; Received Radiation Psychiatric [name], [name] ([id number]) Complaints and Symptoms: No Complaints or Symptoms Hospitalization / Surgery History Name of Hospital Purpose of Hospitalization/Surgery Date [facility] 05/17/2012 Family and Social History Current every day smoker; Marital Status - Single; Alcohol Use: Never; Drug Use: No History; Caffeine Use: Daily; Financial Concerns: No; Food, Clothing or Shelter Needs: No; Support System Lacking: No; Transportation Concerns: No; Advanced Directives: No; Patient does not want information on Advanced Directives Physician Affirmation I have reviewed and agree with the above information. Electronic Signature(s) Signed: 01/27/2015 2:50:55 PM By: Christin Fudge MD, FACS Signed: 01/27/2015 4:36:49 PM By: Montey Hora Entered By: Christin Fudge on 01/27/2015 14:50:55 Chartrand, [name] ([id number]) -------------------------------------------------------------------------------- SuperBill Details Patient Name: [name] Date of Service: 01/27/2015 Medical Record Number: [id number] Patient Account Number: [id number] Date of Birth/Sex: [birth year] (64 y.o. 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 of ankle and inflammation L97.312 Non-pressure chronic ulcer of right ankle with fat layer exposed F17.209 Nicotine dependence, unspecified, with unspecified nicotine-induced disorders Facility Procedures CPT4: Description Modifier Quantity  Code [id number] 99214 - WOUND CARE VISIT-LEV 4 EST PT 1 CPT4: 53299242 (Facility Use Only) [id number] - APPLY MULTLAY COMPRS LWR RT 1 LEG CPT4: 22297989 99406-SMOKING CESSATION 3-10MINS 1 ICD-10 Description Diagnosis F17.209 Nicotine dependence, unspecified, with unspecified nicotine-induced disorders  Physician Procedures CPT4: Description Modifier Quantity Code [id number] 27035 - WC PHYS LEVEL 4 - NEW PT 1 ICD-10 Description Diagnosis 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

## 2015-01-28 NOTE — Progress Notes (Signed)
[name], [name] ([id number]) Visit Report for 01/27/2015 Allergy List Details Patient Name: [name], [name] Date of Service: 01/27/2015 2:30 PM Medical Record Number: [id number] Patient Account Number: [id number] Date of Birth/Sex: December 10, 1950 (64 y.o. Male) Treating RN: Montey Hora Primary Care Physician: PATIENT, NO Other Clinician: Referring Physician: Royetta Crochet Treating Physician/Extender: Frann Rider in Treatment: 0 Allergies Active Allergies No Known Allergies Allergy Notes Electronic Signature(s) Signed: 01/27/2015 4:36:49 PM By: Montey Hora Entered By: Montey Hora on 01/27/2015 14:40:00 [place], [name] ([id number]) -------------------------------------------------------------------------------- Arrival Information Details Patient Name: [name] Date of Service: 01/27/2015 2:30 PM Medical Record Number: [id number] Patient Account Number: [id number] Date of Birth/Sex: Mar 02, 1951 (64 y.o. Male) Treating RN: Montey Hora Primary Care Physician: PATIENT, NO Other Clinician: Referring Physician: Royetta Crochet Treating Physician/Extender: Frann Rider in Treatment: 0 Visit Information Patient Arrived: [name] Arrival Time: 14:35 Accompanied By: self Transfer Assistance: None Patient Identification Verified: Yes Secondary Verification Process Yes Completed: Patient Has Alerts: Yes Patient Alerts: Patient on Blood Thinner warfarin ABI L 1.22 R 1.15 Electronic Signature(s) Signed: 01/27/2015 4:36:49 PM By: Montey Hora Entered By: Montey Hora on 01/27/2015 15:19:43 [name], [name] ([id number]) -------------------------------------------------------------------------------- Clinic Level of Care Assessment Details Patient Name: [name] Date of Service: 01/27/2015 2:30 PM Medical Record Number: [id number] Patient Account Number: [id number] Date of Birth/Sex: 1950-08-09 (64 y.o. Male) Treating RN: Montey Hora Primary Care Physician: PATIENT, NO Other Clinician: Referring Physician: Royetta Crochet Treating Physician/Extender: Frann Rider in Treatment: 0 Clinic Level of Care Assessment Items TOOL 2 Quantity Score '[]'$  - Use when only an EandM is performed on the INITIAL visit 0 ASSESSMENTS - Nursing Assessment / Reassessment X - General Physical Exam (combine w/ comprehensive assessment (listed just 1 20 below) when performed on new pt. evals) X - Comprehensive Assessment (HX, ROS, Risk Assessments, Wounds Hx, etc.) 1 25 ASSESSMENTS - Wound and Skin Assessment / Reassessment X - Simple Wound Assessment / Reassessment - one wound 1 5 '[]'$  - Complex Wound Assessment / Reassessment - multiple wounds 0 '[]'$  - Dermatologic / Skin Assessment (not related to wound area) 0 ASSESSMENTS - Ostomy and/or Continence Assessment and Care '[]'$  - Incontinence Assessment and Management 0 '[]'$  - Ostomy Care Assessment and Management (repouching, etc.) 0 PROCESS - Coordination of Care X - Simple Patient / Family Education for ongoing care 1 15 '[]'$  - Complex (extensive) Patient / Family Education for ongoing care 0 X - Staff obtains Programmer, systems, Records, Test Results / Process Orders 1 10 '[]'$  - Staff telephones HHA, Nursing Homes / Clarify orders / etc 0 '[]'$  - Routine Transfer to another Facility (non-emergent condition) 0 '[]'$  - Routine Hospital Admission (non-emergent condition) 0 '[]'$  - New Admissions / Biomedical engineer / Ordering NPWT, Apligraf, etc. 0 '[]'$  - Emergency Hospital Admission (emergent condition) 0 X - Simple Discharge Coordination 1 10 [name], [name] ([id number]) '[]'$  - Complex (extensive) Discharge Coordination 0 PROCESS - Special Needs '[]'$  - Pediatric / Minor Patient Management 0 '[]'$  - Isolation Patient Management 0 '[]'$  - Hearing / Language / Visual special needs 0 '[]'$  - Assessment of Community assistance (transportation, D/C planning, etc.) 0 '[]'$  - Additional assistance / Altered  mentation 0 '[]'$  - Support Surface(s) Assessment (bed, cushion, seat, etc.) 0 INTERVENTIONS - Wound Cleansing / Measurement X - Wound Imaging (photographs - any number of wounds) 1 5 '[]'$  - Wound Tracing (instead of photographs) 0 X - Simple Wound Measurement - one wound 1 5 '[]'$  - Complex Wound Measurement - multiple wounds 0 X - Simple Wound  Cleansing - one wound 1 5 '[]'$  - Complex Wound Cleansing - multiple wounds 0 INTERVENTIONS - Wound Dressings X - Small Wound Dressing one or multiple wounds 1 10 '[]'$  - Medium Wound Dressing one or multiple wounds 0 '[]'$  - Large Wound Dressing one or multiple wounds 0 '[]'$  - Application of Medications - injection 0 INTERVENTIONS - Miscellaneous '[]'$  - External ear exam 0 '[]'$  - Specimen Collection (cultures, biopsies, blood, body fluids, etc.) 0 '[]'$  - Specimen(s) / Culture(s) sent or taken to Lab for analysis 0 '[]'$  - Patient Transfer (multiple staff / Harrel Lemon Lift / Similar devices) 0 '[]'$  - Simple Staple / Suture removal (25 or less) 0 '[]'$  - Complex Staple / Suture removal (26 or more) 0 [name], [name] ([id number]) '[]'$  - Hypo / Hyperglycemic Management (close monitor of Blood Glucose) 0 X - Ankle / Brachial Index (ABI) - do not check if billed separately 1 15 Has the patient been seen at the hospital within the last three years: Yes Total Score: 125 Level Of Care: New/Established - Level 4 Electronic Signature(s) Signed: 01/27/2015 4:36:49 PM By: Montey Hora Entered By: Montey Hora on 01/27/2015 15:25:34 [name], [name] ([id number]) -------------------------------------------------------------------------------- Encounter Discharge Information Details Patient Name: [name] Date of Service: 01/27/2015 2:30 PM Medical Record Number: [id number] Patient Account Number: [id number] Date of Birth/Sex: [birth year] (64 y.o. Male) Treating RN: Montey Hora Primary Care Physician: PATIENT, NO Other Clinician: Referring Physician: Royetta Crochet Treating Physician/Extender: Frann Rider in Treatment: 0 Encounter Discharge Information Items Discharge Pain Level: 0 Discharge Condition: Stable Ambulatory Status: Cane Discharge Destination: Home Transportation: Private Auto Accompanied By: self Schedule Follow-up Appointment: Yes Medication Reconciliation completed No and provided to Patient/Care [name]: Provided on Clinical Summary of Care: 01/27/2015 Form Type Recipient Paper Patient WD Electronic Signature(s) Signed: 01/27/2015 3:42:44 PM By: Ruthine Dose Entered By: Ruthine Dose on 01/27/2015 15:42:44 [name], [name] -------------------------------------------------------------------------------- Lower Extremity Assessment Details Patient Name: [name] Date of Service: 01/27/2015 2:30 PM Medical Record Number: [id number] Patient Account Number: [id number] Date of Birth/Sex: [birth year] (64 y.o. Male) Treating RN: Montey Hora Primary Care Physician: PATIENT, NO Other Clinician: Referring Physician: Royetta Crochet Treating Physician/Extender: Frann Rider in Treatment: 0 Edema Assessment Assessed: [Left: No] [Right: No] Edema: [Left: Yes] [Right: Yes] Calf Left: Right: Point of Measurement: 39 cm From Medial Instep 44.6 cm 45.7 cm Ankle Left: Right: Point of Measurement: 12 cm From Medial Instep 29.8 cm 30.7 cm Vascular Assessment Pulses: Posterior Tibial Palpable: [Left:Yes] [Right:Yes] Dorsalis Pedis Palpable: [Left:Yes] [Right:Yes] Extremity colors, hair growth, and conditions: Extremity Color: [Left:Normal] [Right:Hyperpigmented] Hair Growth on Extremity: [Left:No] [Right:No] Temperature of Extremity: [Left:Warm] [Right:Warm] Capillary Refill: [Left:< 3 seconds] [Right:< 3 seconds] Blood Pressure: Brachial: [Left:140] [Right:142] Dorsalis Pedis: 173 [Left:Dorsalis Pedis: 742] Ankle: Posterior Tibial: 158 [Left:Posterior Tibial: 144 1.22]  [Right:1.15] Toe Nail Assessment Left: Right: Thick: No Yes Discolored: No Yes Deformed: No No Improper Length and Hygiene: No No Electronic Signature(s) [name], [name] ([id number]) Signed: 01/27/2015 4:36:49 PM By: Montey Hora Entered By: Montey Hora on 01/27/2015 15:13:07 [name], [name] ([id number]) -------------------------------------------------------------------------------- Multi Wound Chart Details Patient Name: [name] Date of Service: 01/27/2015 2:30 PM Medical Record Number: [id number] Patient Account Number: [id number] Date of Birth/Sex: Jan 08, 1951 (64 y.o. Male) Treating RN: Montey Hora Primary Care Physician: PATIENT, NO Other Clinician: Referring Physician: Royetta Crochet Treating Physician/Extender: Frann Rider in Treatment: 0 Vital Signs Height(in): 74 Pulse(bpm): 47 Weight(lbs): 238 Blood Pressure 140/81 (mmHg): Body Mass Index(BMI): 31 Temperature(F): 98.3 Respiratory Rate 20 (breaths/min): Photos: [1:No Photos] [  N/A:N/A] Wound Location: [1:Right Lower Leg - Medial, N/A Distal] Wounding Event: [1:Gradually Appeared] [N/A:N/A] Primary Etiology: [1:Venous Leg Ulcer] [N/A:N/A] Comorbid History: [1:Arrhythmia, Hypertension, N/A Gout, Neuropathy] Date Acquired: [1:01/06/2015] [N/A:N/A] Weeks of Treatment: [1:0] [N/A:N/A] Wound Status: [1:Open] [N/A:N/A] Measurements L x W x D 4.6x2.3x0.4 [N/A:N/A] (cm) Area (cm) : [1:8.31] [N/A:N/A] Volume (cm) : [1:3.324] [N/A:N/A] % Reduction in Area: [1:0.00%] [N/A:N/A] % Reduction in Volume: 0.00% [N/A:N/A] Classification: [1:Full Thickness Without Exposed Support Structures] [N/A:N/A] Exudate Amount: [1:Medium] [N/A:N/A] Exudate Type: [1:Serous] [N/A:N/A] Exudate Color: [1:amber] [N/A:N/A] Wound Margin: [1:Flat and Intact] [N/A:N/A] Granulation Amount: [1:Medium (34-66%)] [N/A:N/A] Granulation Quality: [1:Red, Pink] [N/A:N/A] Necrotic Amount: [1:Medium (34-66%)]  [N/A:N/A] Necrotic Tissue: [1:Eschar, Adherent Slough N/A] Exposed Structures: [1:Fascia: No Fat: No Tendon: No] [N/A:N/A] Muscle: No Joint: No Bone: No Limited to Skin Breakdown Epithelialization: Small (1-33%) N/A N/A Periwound Skin Texture: Edema: No N/A N/A Excoriation: No Induration: No Callus: No Crepitus: No Fluctuance: No Friable: No Rash: No Scarring: No Periwound Skin Maceration: No N/A N/A Moisture: Moist: No Dry/Scaly: No Periwound Skin Color: Atrophie Blanche: No N/A N/A Cyanosis: No Ecchymosis: No Erythema: No Hemosiderin Staining: No Mottled: No Pallor: No Rubor: No Temperature: No Abnormality N/A N/A Tenderness on Yes N/A N/A Palpation: Wound Preparation: Ulcer Cleansing: N/A N/A Rinsed/Irrigated with Saline Topical Anesthetic Applied: Other: lidocaine 4% Treatment Notes Electronic Signature(s) Signed: 01/27/2015 4:36:49 PM By: Montey Hora Entered By: Montey Hora on 01/27/2015 15:21:57 [name], [name] ([id number]) -------------------------------------------------------------------------------- [place] Details Patient Name: [name] Date of Service: 01/27/2015 2:30 PM Medical Record Number: [id number] Patient Account Number: [id number] Date of Birth/Sex: 27-Oct-1950 (64 y.o. Male) Treating RN: Montey Hora Primary Care Physician: PATIENT, NO Other Clinician: Referring Physician: Royetta Crochet Treating Physician/Extender: Frann Rider in Treatment: 0 Active Inactive Abuse / Safety / Falls / Self Care Management Nursing Diagnoses: Impaired physical mobility Potential for falls Goals: Patient will remain injury free Date Initiated: 01/27/2015 Goal Status: Active Interventions: Assess fall risk on admission and as needed Notes: Orientation to the Wound Care Program Nursing Diagnoses: Knowledge deficit related to the wound healing center program Goals: Patient/caregiver will verbalize  understanding of the [place] Program Date Initiated: 01/27/2015 Goal Status: Active Interventions: Provide education on orientation to the wound center Notes: Venous Leg Ulcer Nursing Diagnoses: Potential for venous Insuffiency (use before diagnosis confirmed) Goals: Non-invasive venous studies are completed as ordered [name], [name] ([id number]) Date Initiated: 01/27/2015 Goal Status: Active Patient will maintain optimal edema control Date Initiated: 01/27/2015 Goal Status: Active Interventions: Assess peripheral edema status every visit. Compression as ordered Notes: Wound/Skin Impairment Nursing Diagnoses: Impaired tissue integrity Goals: Ulcer/skin breakdown will have a volume reduction of 30% by week 4 Date Initiated: 01/27/2015 Goal Status: Active Ulcer/skin breakdown will have a volume reduction of 50% by week 8 Date Initiated: 01/27/2015 Goal Status: Active Ulcer/skin breakdown will have a volume reduction of 80% by week 12 Date Initiated: 01/27/2015 Goal Status: Active Ulcer/skin breakdown will heal within 14 weeks Date Initiated: 01/27/2015 Goal Status: Active Interventions: Assess ulceration(s) every visit Notes: Electronic Signature(s) Signed: 01/27/2015 4:36:49 PM By: Montey Hora Entered By: Montey Hora on 01/27/2015 15:21:37 [name], [name] ([id number]) -------------------------------------------------------------------------------- Patient/Caregiver Education Details Patient Name: [name] Date of Service: 01/27/2015 2:30 PM Medical Record Number: [id number] Patient Account Number: [id number] Date of Birth/Gender: [birth year] (64 y.o. Male) Treating RN: Montey Hora Primary Care Physician: PATIENT, NO Other Clinician: Referring Physician: Royetta Crochet Treating Physician/Extender: Frann Rider in Treatment: 0 Education Assessment Education Provided To: Patient Education Topics  Provided Venous: Handouts:  Controlling Swelling with Multilayered Compression Wraps Methods: Explain/Verbal, Printed Responses: State content correctly Wound/Skin Impairment: Handouts: Other: wound healing Methods: Demonstration, Explain/Verbal Responses: State content correctly Electronic Signature(s) Signed: 01/27/2015 4:36:49 PM By: Montey Hora Entered By: Montey Hora on 01/27/2015 15:40:30 [place], [name] ([id number]) -------------------------------------------------------------------------------- Wound Assessment Details Patient Name: [name] Date of Service: 01/27/2015 2:30 PM Medical Record Number: [id number] Patient Account Number: [id number] Date of Birth/Sex: Aug 12, 1950 (64 y.o. Male) Treating RN: Montey Hora Primary Care Physician: PATIENT, NO Other Clinician: Referring Physician: Royetta Crochet Treating Physician/Extender: Frann Rider in Treatment: 0 Wound Status Wound Number: 1 Primary Venous Leg Ulcer Etiology: Wound Location: Right Lower Leg - Medial, Distal Wound Status: Open Wounding Event: Gradually Appeared Comorbid Arrhythmia, Hypertension, Gout, History: Neuropathy Date Acquired: 01/06/2015 Weeks Of Treatment: 0 Clustered Wound: No Photos Photo Uploaded By: Montey Hora on 01/27/2015 16:32:55 Wound Measurements Length: (cm) 4.6 Width: (cm) 2.3 Depth: (cm) 0.4 Area: (cm) 8.31 Volume: (cm) 3.324 % Reduction in Area: 0% % Reduction in Volume: 0% Epithelialization: Small (1-33%) Tunneling: No Undermining: No Wound Description Full Thickness Without Exposed Foul Odor After Classification: Support Structures Wound Margin: Flat and Intact Exudate Medium Amount: Exudate Type: Serous Exudate Color: amber Cleansing: No Wound Bed Granulation Amount: Medium (34-66%) Exposed Structure Granulation Quality: Red, Pink Fascia Exposed: No [place], Jules ([id number]) Necrotic Amount: Medium (34-66%) Fat Layer Exposed: No Necrotic Quality:  Eschar, Adherent Slough Tendon Exposed: No Muscle Exposed: No Joint Exposed: No Bone Exposed: No Limited to Skin Breakdown Periwound Skin Texture Texture Color No Abnormalities Noted: No No Abnormalities Noted: No Callus: No Atrophie Blanche: No Crepitus: No Cyanosis: No Excoriation: No Ecchymosis: No Fluctuance: No Erythema: No Friable: No Hemosiderin Staining: No Induration: No Mottled: No Localized Edema: No Pallor: No Rash: No Rubor: No Scarring: No Temperature / Pain Moisture Temperature: No Abnormality No Abnormalities Noted: No Tenderness on Palpation: Yes Dry / Scaly: No Maceration: No Moist: No Wound Preparation Ulcer Cleansing: Rinsed/Irrigated with Saline Topical Anesthetic Applied: Other: lidocaine 4%, Treatment Notes Wound #1 (Right, Distal, Medial Lower Leg) 1. Cleansed with: Clean wound with Normal Saline 2. Anesthetic Topical Lidocaine 4% cream to wound bed prior to debridement 4. Dressing Applied: Aquacel Ag 5. Secondary Dressing Applied ABD Pad 7. Secured with 2 Layer Compression System - Right Lower Extremity Electronic Signature(s) Signed: 01/27/2015 4:36:49 PM By: Montey Hora Entered By: Montey Hora on 01/27/2015 14:58:59 [place], [name] ([id number]) [place], [name] ([id number]) -------------------------------------------------------------------------------- [place] Details Patient Name: [name] Date of Service: 01/27/2015 2:30 PM Medical Record Number: [id number] Patient Account Number: [id number] Date of Birth/Sex: [birth year] (64 y.o. Male) Treating RN: Montey Hora Primary Care Physician: PATIENT, NO Other Clinician: Referring Physician: Royetta Crochet Treating Physician/Extender: Frann Rider in Treatment: 0 Vital Signs Time Taken: 14:37 Temperature (F): 98.3 Height (in): 74 Pulse (bpm): 47 Source: Stated Respiratory Rate (breaths/min): 20 Weight (lbs): 238 Blood Pressure (mmHg):  140/81 Source: Stated Reference Range: 80 - 120 mg / dl Body Mass Index (BMI): 30.6 Electronic Signature(s) Signed: 01/27/2015 4:36:49 PM By: Montey Hora Entered By: Montey Hora on 01/27/2015 14:39:43

## 2015-01-28 NOTE — Progress Notes (Signed)
WINSTON, MISNER ([id number]) Visit Report for 01/27/2015 Abuse/Suicide Risk Screen Details Patient Name: [name], [name] Date of Service: 01/27/2015 2:30 PM Medical Record Number: [id number] Patient Account Number: [id number] Date of Birth/Sex: 1950/12/26 (64 y.o. Male) Treating RN: Montey Hora Primary Care Physician: PATIENT, NO Other Clinician: Referring Physician: Royetta Crochet Treating Physician/Extender: Frann Rider in Treatment: 0 Abuse/Suicide Risk Screen Items Answer ABUSE/SUICIDE RISK SCREEN: Has anyone close to you tried to hurt or harm you recentlyo No Do you feel uncomfortable with anyone in your familyo No Has anyone forced you do things that you didnot want to doo No Do you have any thoughts of harming yourselfo No Patient displays signs or symptoms of abuse and/or neglect. No Electronic Signature(s) Signed: 01/27/2015 4:36:49 PM By: Montey Hora Entered By: Montey Hora on 01/27/2015 14:46:29 Teschner, [name] ([id number]) -------------------------------------------------------------------------------- Activities of Daily Living Details Patient Name: [name] Date of Service: 01/27/2015 2:30 PM Medical Record Number: [id number] Patient Account Number: [id number] Date of Birth/Sex: 01-05-1951 (64 y.o. Male) Treating RN: Montey Hora Primary Care Physician: PATIENT, NO Other Clinician: Referring Physician: Royetta Crochet Treating Physician/Extender: Frann Rider in Treatment: 0 Activities of Daily Living Items Answer Activities of Daily Living (Please select one for each item) Drive Automobile Completely Able Take Medications Completely Able Use Telephone Completely [place] for Appearance Completely Able Use Toilet Completely Able Bath / Shower Completely Able Dress Self Completely Able Feed Self Completely Able Walk Completely Able Get In / Out Bed Completely Able Housework Completely Able Prepare Meals Completely  [place] for Self Completely Able Electronic Signature(s) Signed: 01/27/2015 4:36:49 PM By: Montey Hora Entered By: Montey Hora on 01/27/2015 14:46:55 Ozbun, [name] ([id number]) -------------------------------------------------------------------------------- Education Assessment Details Patient Name: [name] Date of Service: 01/27/2015 2:30 PM Medical Record Number: [id number] Patient Account Number: [id number] Date of Birth/Sex: 11/05/1950 (64 y.o. Male) Treating RN: Montey Hora Primary Care Physician: PATIENT, NO Other Clinician: Referring Physician: Royetta Crochet Treating Physician/Extender: Frann Rider in Treatment: 0 Primary Learner Assessed: Patient Learning Preferences/Education Level/Primary Language Learning Preference: Explanation, Demonstration Highest Education Level: High School Preferred Language: English Cognitive Barrier Assessment/Beliefs Language Barrier: No Translator Needed: No Memory Deficit: No Emotional Barrier: No Cultural/Religious Beliefs Affecting Medical No Care: Physical Barrier Assessment Impaired Vision: No Impaired Hearing: No Decreased Hand dexterity: Yes Limitations: s/p CVA Knowledge/Comprehension Assessment Knowledge Level: Medium Comprehension Level: Medium Ability to understand written Medium instructions: Ability to understand verbal Medium instructions: Motivation Assessment Anxiety Level: Calm Cooperation: Cooperative Education Importance: Acknowledges Need Interest in Health Problems: Asks Questions Perception: Coherent Willingness to Engage in Self- Medium Management Activities: Readiness to Engage in Self- Medium Management Activities: Electronic Signature(s) CID, AGENA ([id number]) Signed: 01/27/2015 4:36:49 PM By: Montey Hora Entered By: Montey Hora on 01/27/2015 14:48:06 Ickes, [name]  ([id number]) -------------------------------------------------------------------------------- Fall Risk Assessment Details Patient Name: [name] Date of Service: 01/27/2015 2:30 PM Medical Record Number: [id number] Patient Account Number: [id number] Date of Birth/Sex: [birth year] (64 y.o. Male) Treating RN: Montey Hora Primary Care Physician: PATIENT, NO Other Clinician: Referring Physician: Royetta Crochet Treating Physician/Extender: Frann Rider in Treatment: 0 Fall Risk Assessment Items FALL RISK ASSESSMENT: History of falling - immediate or within 3 months 0 No Secondary diagnosis 0 No Ambulatory aid None/bed rest/wheelchair/nurse 0 No Crutches/cane/walker 15 Yes Furniture 0 No IV Access/Saline Lock 0 No Gait/Training Normal/bed rest/immobile 0 No Weak 10 Yes Impaired 0 No Mental Status Oriented to own ability 0 Yes Electronic Signature(s) Signed: 01/27/2015 4:36:49 PM By: Montey Hora Entered By:  Montey Hora on 01/27/2015 14:48:25 Monteleone, [name] ([id number]) -------------------------------------------------------------------------------- Foot Assessment Details Patient Name: SIRR, KABEL Date of Service: 01/27/2015 2:30 PM Medical Record Number: [id number] Patient Account Number: [id number] Date of Birth/Sex: Sep 05, 1950 (64 y.o. Male) Treating RN: Montey Hora Primary Care Physician: PATIENT, NO Other Clinician: Referring Physician: Royetta Crochet Treating Physician/Extender: Frann Rider in Treatment: 0 Foot Assessment Items Site Locations + = Sensation present, - = Sensation absent, C = Callus, U = Ulcer R = Redness, W = Warmth, M = Maceration, PU = Pre-ulcerative lesion F = Fissure, S = Swelling, D = Dryness Assessment Right: Left: Other Deformity: No No Prior Foot Ulcer: No No Prior Amputation: No No Charcot Joint: No No Ambulatory Status: Ambulatory With Help Assistance Device: Cane Gait: Steady Electronic  Signature(s) Signed: 01/27/2015 4:36:49 PM By: Montey Hora Entered By: Montey Hora on 01/27/2015 14:56:38 Dietze, [name] ([id number]) -------------------------------------------------------------------------------- Nutrition Risk Assessment Details Patient Name: [name] Date of Service: 01/27/2015 2:30 PM Medical Record Number: [id number] Patient Account Number: [id number] Date of Birth/Sex: [birth year] (64 y.o. Male) Treating RN: Montey Hora Primary Care Physician: PATIENT, NO Other Clinician: Referring Physician: Royetta Crochet Treating Physician/Extender: Frann Rider in Treatment: 0 Height (in): 74 Weight (lbs): 238 Body Mass Index (BMI): 30.6 Nutrition Risk Assessment Items NUTRITION RISK SCREEN: I have an illness or condition that made me change the kind and/or 0 No amount of food I eat I eat fewer than two meals per day 0 No I eat few fruits and vegetables, or milk products 0 No I have three or more drinks of beer, liquor or wine almost every day 0 No I have tooth or mouth problems that make it hard for me to eat 0 No I don't always have enough money to buy the food I need 0 No I eat alone most of the time 0 No I take three or more different prescribed or over-the-counter drugs a 1 Yes day Without wanting to, I have lost or gained 10 pounds in the last six 0 No months I am not always physically able to shop, cook and/or feed myself 0 No Nutrition Protocols Good Risk Protocol Moderate Risk Protocol Electronic Signature(s) Signed: 01/27/2015 4:36:49 PM By: Montey Hora Entered By: Montey Hora on 01/27/2015 14:48:31

## 2015-02-03 ENCOUNTER — Encounter: Payer: Medicare Other | Admitting: Surgery

## 2015-02-03 DIAGNOSIS — L97312 Non-pressure chronic ulcer of right ankle with fat layer exposed: Secondary | ICD-10-CM | POA: Diagnosis not present

## 2015-02-04 NOTE — Progress Notes (Signed)
ROMEO, ZIELINSKI ([id number]) Visit Report for 02/03/2015 Chief Complaint Document Details Patient Name: [name], [name] Date of Service: 02/03/2015 8:00 AM Medical Record Number: [id number] Patient Account Number: [id number] Date of Birth/Sex: 15-Aug-1950 (64 y.o. Male) Treating RN: Cornell Barman Primary Care Physician: PATIENT, NO Other Clinician: Referring Physician: Royetta Crochet Treating Physician/Extender: Frann Rider in Treatment: 1 Information Obtained from: Patient Chief Complaint Patient presents for treatment of an open ulcer due to venous insufficiency. He says he noticed a "into the right medial ankle for about 3-4 weeks Electronic Signature(s) Signed: 02/03/2015 8:49:14 AM By: Christin Fudge MD, FACS Entered By: Christin Fudge on 02/03/2015 08:49:13 [name], [name] ([id number]) -------------------------------------------------------------------------------- Debridement Details Patient Name: [name] Date of Service: 02/03/2015 8:00 AM Medical Record Number: [id number] Patient Account Number: [id number] Date of Birth/Sex: 1950/10/02 (64 y.o. Male) Treating RN: Cornell Barman Primary Care Physician: PATIENT, NO Other Clinician: Referring Physician: Royetta Crochet Treating Physician/Extender: Frann Rider in Treatment: 1 Debridement Performed for Wound #1 Right,Distal,Medial Lower Leg Assessment: Performed By: Physician Pat Patrick., MD Debridement: Debridement Pre-procedure Yes Verification/Time Out Taken: Start Time: 08:43 Pain Control: Other : lidocaine 4% Level: Skin/Subcutaneous Tissue Total Area Debrided (L x 2.9 (cm) x 2.1 (cm) = 6.09 (cm) W): Tissue and other Viable, Non-Viable, Exudate, Fibrin/Slough, Subcutaneous material debrided: Instrument: Curette Bleeding: None End Time: 08:46 Procedural Pain: 0 Post Procedural Pain: 0 Response to Treatment: Procedure was tolerated well Post Debridement Measurements of Total  Wound Length: (cm) 2.9 Width: (cm) 2.1 Depth: (cm) 0.4 Volume: (cm) 1.913 Post Procedure Diagnosis Same as Pre-procedure Electronic Signature(s) Signed: 02/03/2015 8:49:08 AM By: Christin Fudge MD, FACS Signed: 02/03/2015 4:49:24 PM By: Gretta Cool RN, [name], [name], [name] Entered By: Christin Fudge on 02/03/2015 08:49:08 [name], [name] ([id number]) -------------------------------------------------------------------------------- HPI Details Patient Name: [name] Date of Service: 02/03/2015 8:00 AM Medical Record Number: [id number] Patient Account Number: [id number] Date of Birth/Sex: 1951-04-07 (64 y.o. Male) Treating RN: Cornell Barman Primary Care Physician: PATIENT, NO Other Clinician: Referring Physician: Royetta Crochet Treating Physician/Extender: Frann Rider in Treatment: 1 History of Present Illness Location: open wound just above his right ankle Quality: Patient reports experiencing a dull pain to affected area(s). Severity: Patient states wound are getting worse. Duration: Patient has had the wound for < 4 weeks prior to presenting for treatment Timing: Pain in wound is Intermittent (comes and goes Context: The wound appeared gradually over time Modifying Factors: Consults to this date include: he was seen in the ER and was referred to a vascular surgeon but the patient has not done that. He may have been treated with clindamycin in the ER. Associated Signs and Symptoms: Patient reports having difficulty standing for long periods. HPI Description: 64 year old gentleman who was seen in the emergency department recently on 01/06/2015 for a wound of his right lower extremity which he says was not involving any injury and he did not know how he sustained it. He had draining foul-smelling liquid from the area and had gone for care there. his past medical history is significant for DVT, hypertension, gout, tobacco abuse, cocaine abuse, stroke, atrial fibrillation,  pulmonary embolism. he has also had some vascular surgery with a stent placed in his leg. He has been a smoker for many years and has given up straight drugs several years ago. He continues to smoke about 4-5 cigarettes a day. 02/03/2015 -- received a note from 05/14/2013 where Dr. Leotis Pain placed an inferior vena cava filter. The patient had a deep vein thrombosis while therapeutic on  anticoagulation for previous DVT and a IVC filter was placed for this. Electronic Signature(s) Signed: 02/03/2015 10:31:26 AM By: Christin Fudge MD, FACS Previous Signature: 02/03/2015 8:49:22 AM Version By: Christin Fudge MD, FACS Entered By: Christin Fudge on 02/03/2015 10:31:26 [name], [name] ([id number]) -------------------------------------------------------------------------------- Physical Exam Details Patient Name: [name] Date of Service: 02/03/2015 8:00 AM Medical Record Number: [id number] Patient Account Number: [id number] Date of Birth/Sex: 10-Dec-1950 (64 y.o. Male) Treating RN: Cornell Barman Primary Care Physician: PATIENT, NO Other Clinician: Referring Physician: Royetta Crochet Treating Physician/Extender: Frann Rider in Treatment: 1 Constitutional . Pulse regular. Respirations normal and unlabored. Afebrile. . Eyes Nonicteric. Reactive to light. Ears, Nose, Mouth, and Throat Lips, teeth, and gums WNL.[name] Moist mucosa without lesions . Neck supple and nontender. No palpable supraclavicular or cervical adenopathy. Normal sized without goiter. Respiratory WNL. No retractions.. Cardiovascular Pedal Pulses WNL. the edema is better and there is good resolution of all the fluid collected in his right lower extremity. Chest Breasts symmetical and no nipple discharge.. Breast tissue WNL, no masses, lumps, or tenderness.. Lymphatic No adneopathy. No adenopathy. No adenopathy. Musculoskeletal Adexa without tenderness or enlargement.. Digits and nails w/o clubbing, cyanosis,  infection, petechiae, ischemia, or inflammatory conditions.. Integumentary (Hair, Skin) No suspicious lesions. No crepitus or fluctuance. No peri-wound warmth or erythema. No masses.[name] Psychiatric Judgement and insight Intact.. No evidence of depression, anxiety, or agitation.. Notes The ulceration has some slough and will need sharp debridement with a curette. Electronic Signature(s) Signed: 02/03/2015 8:50:15 AM By: Christin Fudge MD, FACS Entered By: Christin Fudge on 02/03/2015 08:50:15 [name], [name] ([id number]) -------------------------------------------------------------------------------- Physician Orders Details Patient Name: [name] Date of Service: 02/03/2015 8:00 AM Medical Record Number: [id number] Patient Account Number: [id number] Date of Birth/Sex: [birth year] (64 y.o. Male) Treating RN: Cornell Barman Primary Care Physician: PATIENT, NO Other Clinician: Referring Physician: Royetta Crochet Treating Physician/Extender: Frann Rider in Treatment: 1 Verbal / Phone Orders: Yes Clinician: Cornell Barman Read Back and Verified: Yes Diagnosis Coding Wound Cleansing Wound #1 Right,Distal,Medial Lower Leg o Clean wound with Normal Saline. Anesthetic Wound #1 Right,Distal,Medial Lower Leg o Topical Lidocaine 4% cream applied to wound bed prior to debridement Primary Wound Dressing Wound #1 Right,Distal,Medial Lower Leg o Aquacel Ag Secondary Dressing Wound #1 Right,Distal,Medial Lower Leg o ABD pad Dressing Change Frequency Wound #1 Right,Distal,Medial Lower Leg o Change dressing every week o Other: - Wrap to be reapplied by [facility] following testing on Friday 02/07/2015. Follow-up Appointments Wound #1 Right,Distal,Medial Lower Leg o Return Appointment in 1 week. o Nurse Visit as needed Edema Control Wound #1 Right,Distal,Medial Lower Leg o 3 Layer Compression System - Right Lower Extremity - Profore Lite o Elevate legs to the level of  the heart and pump ankles as often as possible Electronic Signature(s) Signed: 02/03/2015 3:58:37 PM By: Christin Fudge MD, FACS Signed: 02/03/2015 4:49:24 PM By: Gretta Cool RN, [name], [name], [name] [name], [name] ([id number]) Entered By: Gretta Cool, RN, [name], Kim on 02/03/2015 08:50:03 [name], [name] ([id number]) -------------------------------------------------------------------------------- Problem List Details Patient Name: [name] Date of Service: 02/03/2015 8:00 AM Medical Record Number: [id number] Patient Account Number: [id number] Date of Birth/Sex: Apr 07, 1951 (64 y.o. 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 Male) Treating RN: Cornell Barman Primary Care Physician: PATIENT, NO Other Clinician: Referring Physician: Royetta Crochet Treating Physician/Extender: Frann Rider in Treatment: 1 Subjective Chief  Complaint Information obtained from Patient Patient presents for treatment of an open ulcer due to venous insufficiency. He says he noticed a "into the right medial ankle for about 3-4 weeks History of Present Illness (HPI) The following HPI elements were documented for the patient's wound: Location: open wound just above his right ankle Quality: Patient reports experiencing a dull pain to affected area(s). Severity: Patient states wound are getting worse. Duration: Patient has had the wound for < 4 weeks prior to presenting for treatment Timing: Pain in wound is Intermittent (comes and goes Context: The wound appeared gradually over time Modifying Factors: Consults to this date include: he was seen in the ER and was referred to a vascular surgeon but the patient has not done that. He may have been treated with clindamycin in the ER. Associated Signs and Symptoms: Patient reports having difficulty standing for long periods. 64 year old gentleman who was seen in the emergency department recently on 01/06/2015 for a wound of his right lower extremity which he says was not involving any injury and he did not know how he sustained it. He had draining foul-smelling liquid from the area and had gone for care there. his past medical history is significant for DVT, hypertension, gout, tobacco abuse, cocaine abuse, stroke, atrial fibrillation, pulmonary embolism. he has also had some vascular surgery with a stent placed in his leg. He has been a smoker for many years and has given up straight drugs several years ago. He continues to smoke about 4-5 cigarettes a day. 02/03/2015 -- received a note from 05/14/2013 where Dr. Leotis Pain placed an inferior vena cava filter. The patient had a deep vein thrombosis while therapeutic on anticoagulation for previous DVT and a IVC filter was placed for this. Objective Constitutional Pulse regular. Respirations normal and unlabored. Afebrile. [name],  [name] ([id number]) Vitals Time Taken: 8:25 AM, Height: 74 in, Weight: 238 lbs, BMI: 30.6, Temperature: 97.6 F, Pulse: 62 bpm, Respiratory Rate: 24 breaths/min, Blood Pressure: 134/88 mmHg. Eyes Nonicteric. Reactive to light. Ears, Nose, Mouth, and Throat Lips, teeth, and gums WNL.[name] Moist mucosa without lesions . Neck supple and nontender. No palpable supraclavicular or cervical adenopathy. Normal sized without goiter. Respiratory WNL. No retractions.. Cardiovascular Pedal Pulses WNL. the edema is better and there is good resolution of all the fluid collected in his right lower extremity. Chest Breasts symmetical and no nipple discharge.. Breast tissue WNL, no masses, lumps, or tenderness.. Lymphatic No adneopathy. No adenopathy. No adenopathy. Musculoskeletal Adexa without tenderness or enlargement.. Digits and nails w/o clubbing, cyanosis, infection, petechiae, ischemia, or inflammatory conditions.[name] Psychiatric Judgement and insight Intact.. No evidence of depression, anxiety, or agitation.. General Notes: The ulceration has some slough and will need sharp debridement with a curette. Integumentary (Hair, Skin) No suspicious lesions. No crepitus or fluctuance. No peri-wound warmth or erythema. No masses.. Wound #1 status is Open. Original cause of wound was Gradually Appeared. The  wound is located on the Right,Distal,Medial Lower Leg. The wound measures 2.9cm length x 2.1cm width x 0.4cm depth; 4.783cm^2 area and 1.913cm^3 volume. The wound is limited to skin breakdown. There is no tunneling or undermining noted. There is a medium amount of serous drainage noted. The wound margin is flat and intact. There is large (67-100%) red, pink granulation within the wound bed. There is a small (1-33%) amount of necrotic tissue within the wound bed including Eschar and Adherent Slough. The periwound skin appearance did not exhibit: Callus, Crepitus, Excoriation, Fluctuance, Friable,  Induration, Localized Edema, Rash, Scarring, Dry/Scaly, Maceration, Moist, Atrophie Blanche, Cyanosis, Ecchymosis, Hemosiderin Staining, Mottled, Pallor, Rubor, Erythema. Periwound temperature was noted as No Abnormality. The periwound has tenderness on palpation. 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 He has his vascular workup to be done this coming Friday and I was asked him to make sure that he gets a wrap replaced once the test is done. If not he should call our office on Friday afternoon. As far as his arterial studies and reports go these are still pending from the vascular office and I think it was about 3 years ago that he had a stent placed. Procedures Wound #1 Wound #1 is a Venous Leg Ulcer located on the Right,Distal,Medial Lower Leg . There was a Skin/Subcutaneous Tissue Debridement (40981-19147) debridement with total area of 6.09 sq cm performed by Britto, Jackson Latino., MD. with the following instrument(s): Curette to remove Viable and Non-Viable tissue/material including Exudate, Fibrin/Slough, and Subcutaneous after achieving pain control using Other (lidocaine 4%). A time out was conducted prior to the start of the procedure. There was no bleeding. The procedure was tolerated well with a pain level of 0 throughout and a pain level of 0 following the procedure. Post Debridement Measurements: 2.9cm length x 2.1cm width x 0.4cm depth; 1.913cm^3 volume. Post procedure Diagnosis Wound #1: Same as Pre-Procedure Plan Wound Cleansing: Wound #1  Right,Distal,Medial Lower Leg: Clean wound with Normal Saline. Anesthetic: Wound #1 Right,Distal,Medial Lower Leg: Topical Lidocaine 4% cream applied to wound bed prior to debridement Primary Wound Dressing: [name], [name] ([id number]) Wound #1 Right,Distal,Medial Lower Leg: Aquacel Ag Secondary Dressing: Wound #1 Right,Distal,Medial Lower Leg: ABD pad Dressing Change Frequency: Wound #1 Right,Distal,Medial Lower Leg: Change dressing every week Other: - Wrap to be reapplied by [facility] following testing on Friday 02/07/2015. Follow-up Appointments: Wound #1 Right,Distal,Medial Lower Leg: Return Appointment in 1 week. Nurse Visit as needed Edema Control: Wound #1 Right,Distal,Medial Lower Leg: 3 Layer Compression System - Right Lower Extremity - Profore Lite Elevate legs to the level of the heart and pump ankles as often as possible We will continue with silver alginate and we will go to a 3 layer Profore lite. He has his vascular workup to be done this coming Friday and I was asked him to make sure that he gets a wrap replaced once the test is done. If not he should call our office on Friday afternoon. As far as his arterial studies and reports go these are still pending from the vascular office and I think it was about 3 years ago that  he had a stent placed. Electronic Signature(s) Signed: 02/03/2015 10:31:42 AM By: Christin Fudge MD, FACS Previous Signature: 02/03/2015 8:51:12 AM Version By: Christin Fudge MD, FACS Entered By: Christin Fudge on 02/03/2015 10:31:41 [name], [name] ([id number]) -------------------------------------------------------------------------------- SuperBill Details Patient Name: [name] Date of Service: 02/03/2015 Medical Record Number: [id number] Patient Account Number: [id number] Date of Birth/Sex: March 28, 1951 (64 y.o. 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 of ankle and inflammation L97.312 Non-pressure chronic ulcer of right ankle with fat layer exposed F17.209 Nicotine dependence, unspecified, with unspecified nicotine-induced disorders Facility Procedures CPT4: Description Modifier Quantity Code [id number] 11042 - DEB SUBQ TISSUE 20 SQ CM/< 1 ICD-10 Description Diagnosis 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 I83.213 Varicose veins of right lower extremity with both ulcer of  ankle and inflammation L97.312 Non-pressure chronic ulcer of right ankle with fat layer exposed Physician Procedures CPT4: Description Modifier Quantity Code [id number] 58099 - WC PHYS SUBQ TISS 20 SQ CM 1 ICD-10 Description Diagnosis 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

## 2015-02-04 NOTE — Progress Notes (Signed)
[name], [name] ([id number]) Visit Report for 02/03/2015 Arrival Information Details Patient Name: [name], [name] Date of Service: 02/03/2015 8:00 AM Medical Record Number: [id number] Patient Account Number: [id number] Date of Birth/Sex: July 19, 1950 (64 y.o. Male) Treating RN: Montey Hora Primary Care Physician: PATIENT, NO Other Clinician: Referring Physician: Royetta Crochet Treating Physician/Extender: Frann Rider in Treatment: 1 Visit Information History Since Last Visit Added or deleted any medications: No Patient Arrived: Ambulatory Any new allergies or adverse reactions: No Arrival Time: 08:20 Had a fall or experienced change in No Accompanied By: self activities of daily living that may affect Transfer Assistance: None risk of falls: Patient Identification Verified: Yes Signs or symptoms of abuse/neglect since last No Secondary Verification Process Yes visito Completed: Hospitalized since last visit: No Patient Has Alerts: Yes Pain Present Now: Yes Patient Alerts: Patient on Blood Thinner warfarin ABI L 1.22 R 1.15 Electronic Signature(s) Signed: 02/03/2015 3:26:09 PM By: Montey Hora Entered By: Montey Hora on 02/03/2015 08:25:16 [name], [name] ([id number]) -------------------------------------------------------------------------------- Encounter Discharge Information Details Patient Name: [name] Date of Service: 02/03/2015 8:00 AM Medical Record Number: [id number] Patient Account Number: [id number] Date of Birth/Sex: [birth year] (64 y.o. Male) Treating RN: Montey Hora Primary Care Physician: PATIENT, NO Other Clinician: Referring Physician: Royetta Crochet Treating Physician/Extender: Frann Rider in Treatment: 1 Encounter Discharge Information Items Discharge Pain Level: 0 Discharge Condition: Stable Ambulatory Status: Ambulatory Discharge Destination: Home Private Transportation: Auto Accompanied By:  self Schedule Follow-up Appointment: Yes Medication Reconciliation completed and No provided to Patient/Care Provider: Clinical Summary of Care: Electronic Signature(s) Signed: 02/03/2015 3:26:09 PM By: Montey Hora Entered By: Montey Hora on 02/03/2015 08:56:05 [name], [name] ([id number]) -------------------------------------------------------------------------------- Lower Extremity Assessment Details Patient Name: [name] Date of Service: 02/03/2015 8:00 AM Medical Record Number: [id number] Patient Account Number: [id number] Date of Birth/Sex: [birth year] (64 y.o. Male) Treating RN: Montey Hora Primary Care Physician: PATIENT, NO Other Clinician: Referring Physician: Royetta Crochet Treating Physician/Extender: Frann Rider in Treatment: 1 Edema Assessment Assessed: [Left: No] [Right: No] Edema: [Left: Ye] [Right: s] Calf Left: Right: Point of Measurement: 39 cm From Medial Instep cm 42.8 cm Ankle Left: Right: Point of Measurement: 12 cm From Medial Instep cm 30 cm Vascular Assessment Pulses: Posterior Tibial Dorsalis Pedis Palpable: [Right:Yes] Extremity colors, hair growth, and conditions: Extremity Color: [Right:Hyperpigmented] Hair Growth on Extremity: [Right:Yes] Temperature of Extremity: [Right:Warm] Capillary Refill: [Right:< 3 seconds] Toe Nail Assessment Left: Right: Thick: Yes Discolored: Yes Deformed: No Improper Length and Hygiene: No Electronic Signature(s) Signed: 02/03/2015 3:26:09 PM By: Montey Hora Entered By: Montey Hora on 02/03/2015 08:32:15 [name], [name] ([id number]) -------------------------------------------------------------------------------- Multi Wound Chart Details Patient Name: [name] Date of Service: 02/03/2015 8:00 AM Medical Record Number: [id number] Patient Account Number: [id number] Date of Birth/Sex: Jul 11, 1950 (64 y.o. Male) Treating RN: Cornell Barman Primary Care Physician:  PATIENT, NO Other Clinician: Referring Physician: Royetta Crochet Treating Physician/Extender: Frann Rider in Treatment: 1 Vital Signs Height(in): 74 Pulse(bpm): 62 Weight(lbs): 238 Blood Pressure 134/88 (mmHg): Body Mass Index(BMI): 31 Temperature(F): 97.6 Respiratory Rate 24 (breaths/min): Photos: [1:No Photos] [N/A:N/A] Wound Location: [1:Right Lower Leg - Medial, N/A Distal] Wounding Event: [1:Gradually Appeared] [N/A:N/A] Primary Etiology: [1:Venous Leg Ulcer] [N/A:N/A] Comorbid History: [1:Arrhythmia, Hypertension, N/A Gout, Neuropathy] Date Acquired: [1:01/06/2015] [N/A:N/A] Weeks of Treatment: [1:1] [N/A:N/A] Wound Status: [1:Open] [N/A:N/A] Measurements L x W x D 2.9x2.1x0.4 [N/A:N/A] (cm) Area (cm) : [1:4.783] [N/A:N/A] Volume (cm) : [1:1.913] [N/A:N/A] % Reduction in Area: [1:42.40%] [N/A:N/A] % Reduction in Volume: 42.40% [N/A:N/A] Classification: [1:Full Thickness Without Exposed Support Structures] [  N/A:N/A] Exudate Amount: [1:Medium] [N/A:N/A] Exudate Type: [1:Serous] [N/A:N/A] Exudate Color: [1:amber] [N/A:N/A] Wound Margin: [1:Flat and Intact] [N/A:N/A] Granulation Amount: [1:Large (67-100%)] [N/A:N/A] Granulation Quality: [1:Red, Pink] [N/A:N/A] Necrotic Amount: [1:Small (1-33%)] [N/A:N/A] Necrotic Tissue: [1:Eschar, Adherent Slough N/A] Exposed Structures: [1:Fascia: No Fat: No Tendon: No] [N/A:N/A] Muscle: No Joint: No Bone: No Limited to Skin Breakdown Epithelialization: Small (1-33%) N/A N/A Periwound Skin Texture: Edema: No N/A N/A Excoriation: No Induration: No Callus: No Crepitus: No Fluctuance: No Friable: No Rash: No Scarring: No Periwound Skin Maceration: No N/A N/A Moisture: Moist: No Dry/Scaly: No Periwound Skin Color: Atrophie Blanche: No N/A N/A Cyanosis: No Ecchymosis: No Erythema: No Hemosiderin Staining: No Mottled: No Pallor: No Rubor: No Temperature: No Abnormality N/A N/A Tenderness on Yes N/A  N/A Palpation: Wound Preparation: Ulcer Cleansing: Other: N/A N/A soap and water Topical Anesthetic Applied: Other: lidocaine 4% Treatment Notes Electronic Signature(s) Signed: 02/03/2015 4:49:24 PM By: Gretta Cool, RN, [name], [name], [name] Entered By: Gretta Cool, RN, [name], Kim on 02/03/2015 08:43:20 [name], [name] ([id number]) -------------------------------------------------------------------------------- Multi-Disciplinary Care Plan Details Patient Name: [name] Date of Service: 02/03/2015 8:00 AM Medical Record Number: [id number] Patient Account Number: [id number] Date of Birth/Sex: 1951-04-20 (64 y.o. Male) Treating RN: Cornell Barman Primary Care Physician: PATIENT, NO Other Clinician: Referring Physician: Royetta Crochet Treating Physician/Extender: Frann Rider in Treatment: 1 Active Inactive Abuse / Safety / Falls / Self Care Management Nursing Diagnoses: Impaired physical mobility Potential for falls Goals: Patient will remain injury free Date Initiated: 01/27/2015 Goal Status: Active Interventions: Assess fall risk on admission and as needed Notes: Orientation to the Wound Care Program Nursing Diagnoses: Knowledge deficit related to the wound healing center program Goals: Patient/caregiver will verbalize understanding of the [place] Program Date Initiated: 01/27/2015 Goal Status: Active Interventions: Provide education on orientation to the wound center Notes: Venous Leg Ulcer Nursing Diagnoses: Potential for venous Insuffiency (use before diagnosis confirmed) Goals: Non-invasive venous studies are completed as ordered PETR, BONTEMPO ([id number]) Date Initiated: 01/27/2015 Goal Status: Active Patient will maintain optimal edema control Date Initiated: 01/27/2015 Goal Status: Active Interventions: Assess peripheral edema status every visit. Compression as ordered Notes: Wound/Skin Impairment Nursing Diagnoses: Impaired tissue  integrity Goals: Ulcer/skin breakdown will have a volume reduction of 30% by week 4 Date Initiated: 01/27/2015 Goal Status: Active Ulcer/skin breakdown will have a volume reduction of 50% by week 8 Date Initiated: 01/27/2015 Goal Status: Active Ulcer/skin breakdown will have a volume reduction of 80% by week 12 Date Initiated: 01/27/2015 Goal Status: Active Ulcer/skin breakdown will heal within 14 weeks Date Initiated: 01/27/2015 Goal Status: Active Interventions: Assess ulceration(s) every visit Notes: Electronic Signature(s) Signed: 02/03/2015 4:49:24 PM By: Gretta Cool, RN, [name], [name], [name] Entered By: Gretta Cool, RN, [name], Kim on 02/03/2015 08:43:12 [name], [name] ([id number]) -------------------------------------------------------------------------------- Pain Assessment Details Patient Name: [name] Date of Service: 02/03/2015 8:00 AM Medical Record Number: [id number] Patient Account Number: [id number] Date of Birth/Sex: 1951-04-15 (64 y.o. Male) Treating RN: Montey Hora Primary Care Physician: PATIENT, NO Other Clinician: Referring Physician: Royetta Crochet Treating Physician/Extender: Frann Rider in Treatment: 1 Active Problems Location of Pain Severity and Description of Pain Patient Has Paino Yes Site Locations Pain Location: Pain in Ulcers With Dressing Change: Yes Duration of the Pain. Constant / Intermittento Constant Pain Management and Medication Current Pain Management: Electronic Signature(s) Signed: 02/03/2015 3:26:09 PM By: Montey Hora Entered By: Montey Hora on 02/03/2015 08:25:31 [name], [name] ([id number]) -------------------------------------------------------------------------------- Patient/Caregiver Education Details Patient Name: [name] Date of Service: 02/03/2015 8:00 AM Medical  Record Number: [id number] Patient Account Number: [id number] Date of Birth/Gender: 29-May-1950 (64 y.o. Male) Treating RN: Montey Hora Primary Care Physician: PATIENT, NO Other Clinician: Referring Physician: Royetta Crochet Treating Physician/Extender: Frann Rider in Treatment: 1 Education Assessment Education Provided To: Patient Education Topics Provided Venous: Handouts: Other: need for compression Methods: Explain/Verbal Responses: State content correctly Wound/Skin Impairment: Handouts: Other: wound healing Methods: Explain/Verbal Responses: State content correctly Electronic Signature(s) Signed: 02/03/2015 8:36:29 AM By: Montey Hora Entered By: Montey Hora on 02/03/2015 08:36:29 [name], [name] ([id number]) -------------------------------------------------------------------------------- Wound Assessment Details Patient Name: [name] Date of Service: 02/03/2015 8:00 AM Medical Record Number: [id number] Patient Account Number: [id number] Date of Birth/Sex: November 19, 1950 (64 y.o. Male) Treating RN: Montey Hora Primary Care Physician: PATIENT, NO Other Clinician: Referring Physician: Royetta Crochet Treating Physician/Extender: Frann Rider in Treatment: 1 Wound Status Wound Number: 1 Primary Venous Leg Ulcer Etiology: Wound Location: Right Lower Leg - Medial, Distal Wound Status: Open Wounding Event: Gradually Appeared Comorbid Arrhythmia, Hypertension, Gout, History: Neuropathy Date Acquired: 01/06/2015 Weeks Of Treatment: 1 Clustered Wound: No Photos Photo Uploaded By: Montey Hora on 02/03/2015 12:20:19 Wound Measurements Length: (cm) 2.9 Width: (cm) 2.1 Depth: (cm) 0.4 Area: (cm) 4.783 Volume: (cm) 1.913 % Reduction in Area: 42.4% % Reduction in Volume: 42.4% Epithelialization: Small (1-33%) Tunneling: No Undermining: No Wound Description Full Thickness Without Exposed Foul Odor After Classification: Support Structures Wound Margin: Flat and Intact Exudate Medium Amount: Exudate Type: Serous Exudate Color: amber Cleansing: No Wound  Bed Granulation Amount: Large (67-100%) Exposed Structure Granulation Quality: Red, Pink Fascia Exposed: No [name], Bentleigh ([id number]) Necrotic Amount: Small (1-33%) Fat Layer Exposed: No Necrotic Quality: Eschar, Adherent Slough Tendon Exposed: No Muscle Exposed: No Joint Exposed: No Bone Exposed: No Limited to Skin Breakdown Periwound Skin Texture Texture Color No Abnormalities Noted: No No Abnormalities Noted: No Callus: No Atrophie Blanche: No Crepitus: No Cyanosis: No Excoriation: No Ecchymosis: No Fluctuance: No Erythema: No Friable: No Hemosiderin Staining: No Induration: No Mottled: No Localized Edema: No Pallor: No Rash: No Rubor: No Scarring: No Temperature / Pain Moisture Temperature: No Abnormality No Abnormalities Noted: No Tenderness on Palpation: Yes Dry / Scaly: No Maceration: No Moist: No Wound Preparation Ulcer Cleansing: Other: soap and water, Topical Anesthetic Applied: Other: lidocaine 4%, Treatment Notes Wound #1 (Right, Distal, Medial Lower Leg) 1. Cleansed with: Cleanse wound with antibacterial soap and water 2. Anesthetic Topical Lidocaine 4% cream to wound bed prior to debridement 4. Dressing Applied: Aquacel Ag 5. Secondary Dressing Applied ABD Pad 7. Secured with 3 Layer Compression System - Right Lower Extremity Electronic Signature(s) Signed: 02/03/2015 3:26:09 PM By: Montey Hora Entered By: Montey Hora on 02/03/2015 08:33:29 Gonyer, [name] ([id number]) [name], [name] ([id number]) -------------------------------------------------------------------------------- [place] Details Patient Name: [name] Date of Service: 02/03/2015 8:00 AM Medical Record Number: [id number] Patient Account Number: [id number] Date of Birth/Sex: [birth year] (64 y.o. Male) Treating RN: Montey Hora Primary Care Physician: PATIENT, NO Other Clinician: Referring Physician: Royetta Crochet Treating Physician/Extender:  Frann Rider in Treatment: 1 Vital Signs Time Taken: 08:25 Temperature (F): 97.6 Height (in): 74 Pulse (bpm): 62 Weight (lbs): 238 Respiratory Rate (breaths/min): 24 Body Mass Index (BMI): 30.6 Blood Pressure (mmHg): 134/88 Reference Range: 80 - 120 mg / dl Electronic Signature(s) Signed: 02/03/2015 3:26:09 PM By: Montey Hora Entered By: Montey Hora on 02/03/2015 92:01:00

## 2015-02-10 ENCOUNTER — Encounter: Payer: Medicare Other | Admitting: Surgery

## 2015-02-10 DIAGNOSIS — L97312 Non-pressure chronic ulcer of right ankle with fat layer exposed: Secondary | ICD-10-CM | POA: Diagnosis not present

## 2015-02-12 NOTE — Progress Notes (Signed)
HARRINGTON, JOBE ([id number]) Visit Report for 02/10/2015 Chief Complaint Document Details Patient Name: [name], [name] Date of Service: 02/10/2015 8:00 AM Medical Record Number: [id number] Patient Account Number: [id number] Date of Birth/Sex: 1950/07/16 (64 y.o. Male) Treating RN: Cornell Barman Primary Care Physician: PATIENT, NO Other Clinician: Referring Physician: Royetta Crochet Treating Physician/Extender: Frann Rider in Treatment: 2 Information Obtained from: Patient Chief Complaint Patient presents for treatment of an open ulcer due to venous insufficiency. He says he noticed a "into the right medial ankle for about 3-4 weeks Electronic Signature(s) Signed: 02/10/2015 8:40:00 AM By: Christin Fudge MD, FACS Entered By: Christin Fudge on 02/10/2015 08:40:00 Glaza, [name] ([id number]) -------------------------------------------------------------------------------- HPI Details Patient Name: [name] Date of Service: 02/10/2015 8:00 AM Medical Record Number: [id number] Patient Account Number: [id number] Date of Birth/Sex: [birth year] (64 y.o. Male) Treating RN: Cornell Barman Primary Care Physician: PATIENT, NO Other Clinician: Referring Physician: Royetta Crochet Treating Physician/Extender: Frann Rider in Treatment: 2 History of Present Illness Location: open wound just above his right ankle Quality: Patient reports experiencing a dull pain to affected area(s). Severity: Patient states wound are getting worse. Duration: Patient has had the wound for < 4 weeks prior to presenting for treatment Timing: Pain in wound is Intermittent (comes and goes Context: The wound appeared gradually over time Modifying Factors: Consults to this date include: he was seen in the ER and was referred to a vascular surgeon but the patient has not done that. He may have been treated with clindamycin in the ER. Associated Signs and Symptoms: Patient reports having difficulty  standing for long periods. HPI Description: 64 year old gentleman who was seen in the emergency department recently on 01/06/2015 for a wound of his right lower extremity which he says was not involving any injury and he did not know how he sustained it. He had draining foul-smelling liquid from the area and had gone for care there. his past medical history is significant for DVT, hypertension, gout, tobacco abuse, cocaine abuse, stroke, atrial fibrillation, pulmonary embolism. he has also had some vascular surgery with a stent placed in his leg. He has been a smoker for many years and has given up straight drugs several years ago. He continues to smoke about 4-5 cigarettes a day. 02/03/2015 -- received a note from 05/14/2013 where Dr. Leotis Pain placed an inferior vena cava filter. The patient had a deep vein thrombosis while therapeutic on anticoagulation for previous DVT and a IVC filter was placed for this. 02/10/2015 -- he did have his vascular test done on Friday but we have no reports yet. Electronic Signature(s) Signed: 02/10/2015 8:40:33 AM By: Christin Fudge MD, FACS Entered By: Christin Fudge on 02/10/2015 08:40:32 Denson, [name] ([id number]) -------------------------------------------------------------------------------- Physical Exam Details Patient Name: [name] Date of Service: 02/10/2015 8:00 AM Medical Record Number: [id number] Patient Account Number: [id number] Date of Birth/Sex: [birth year] (64 y.o. Male) Treating RN: Cornell Barman Primary Care Physician: PATIENT, NO Other Clinician: Referring Physician: Royetta Crochet Treating Physician/Extender: Frann Rider in Treatment: 2 Constitutional . Pulse regular. Respirations normal and unlabored. Afebrile. . Eyes Nonicteric. Reactive to light. Ears, Nose, Mouth, and Throat Lips, teeth, and gums WNL.[name] Moist mucosa without lesions . Neck supple and nontender. No palpable supraclavicular or cervical  adenopathy. Normal sized without goiter. Respiratory WNL. No retractions.. Cardiovascular Pedal Pulses WNL. No clubbing, cyanosis or edema. Lymphatic No adneopathy. No adenopathy. No adenopathy. Musculoskeletal Adexa without tenderness or enlargement.. Digits and nails w/o clubbing, cyanosis, infection, petechiae, ischemia, or inflammatory conditions.. Integumentary (Hair, Skin)  No suspicious lesions. No crepitus or fluctuance. No peri-wound warmth or erythema. No masses.[name] Psychiatric Judgement and insight Intact.. No evidence of depression, anxiety, or agitation.. Notes The edema is well controlled and the ulcer on the right medial ankle is looking clean and no curettage was done. Electronic Signature(s) Signed: 02/10/2015 8:41:06 AM By: Christin Fudge MD, FACS Entered By: Christin Fudge on 02/10/2015 08:41:06 Call, [name] ([id number]) -------------------------------------------------------------------------------- Physician Orders Details Patient Name: [name] Date of Service: 02/10/2015 8:00 AM Medical Record Number: [id number] Patient Account Number: [id number] Date of Birth/Sex: 1950-07-01 (64 y.o. Male) Treating RN: Cornell Barman Primary Care Physician: PATIENT, NO Other Clinician: Referring Physician: Royetta Crochet Treating Physician/Extender: Frann Rider in Treatment: 2 Verbal / Phone Orders: Yes Clinician: Cornell Barman Read Back and Verified: Yes Diagnosis Coding Wound Cleansing Wound #1 Right,Distal,Medial Lower Leg o Clean wound with Normal Saline. Anesthetic Wound #1 Right,Distal,Medial Lower Leg o Topical Lidocaine 4% cream applied to wound bed prior to debridement Primary Wound Dressing Wound #1 Right,Distal,Medial Lower Leg o Aquacel Ag Secondary Dressing Wound #1 Right,Distal,Medial Lower Leg o ABD pad Dressing Change Frequency Wound #1 Right,Distal,Medial Lower Leg o Change dressing every week Follow-up Appointments Wound  #1 Right,Distal,Medial Lower Leg o Return Appointment in 1 week. Edema Control Wound #1 Right,Distal,Medial Lower Leg o Unna Boot to Right Lower Extremity o Elevate legs to the level of the heart and pump ankles as often as possible Electronic Signature(s) Signed: 02/10/2015 4:29:43 PM By: Christin Fudge MD, FACS Signed: 02/11/2015 5:26:34 PM By: Gretta Cool RN, [name], [name], [name] Entered By: Gretta Cool, RN, [name], Kim on 02/10/2015 08:36:54 Hakeem, [name] ([id number]) ANDRANIK, JEUNE ([id number]) -------------------------------------------------------------------------------- Problem List Details Patient Name: [name] Date of Service: 02/10/2015 8:00 AM Medical Record Number: [id number] Patient Account Number: [id number] Date of Birth/Sex: 23-Jan-1951 (64 y.o. 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 Male) Treating RN:  Cornell Barman Primary Care Physician: PATIENT, NO Other Clinician: Referring Physician: Royetta Crochet Treating Physician/Extender: Frann Rider in Treatment: 2 Subjective Chief Complaint Information obtained from Patient Patient presents for treatment of an open ulcer due to venous insufficiency. He says he noticed a "into the right medial ankle for about 3-4 weeks History of Present Illness (HPI) The following HPI elements were documented for the patient's wound: Location: open wound just above his right ankle Quality: Patient reports experiencing a dull pain to affected area(s). Severity: Patient states wound are getting worse. Duration: Patient has had the wound for < 4 weeks prior to presenting for treatment Timing: Pain in wound is Intermittent (comes and goes Context: The wound appeared gradually over time Modifying Factors: Consults to this date include: he was seen in the ER and was referred to a vascular surgeon but the patient  has not done that. He may have been treated with clindamycin in the ER. Associated Signs and Symptoms: Patient reports having difficulty standing for long periods. 64 year old gentleman who was seen in the emergency department recently on 01/06/2015 for a wound of his right lower extremity which he says was not involving any injury and he did not know how he sustained it. He had draining foul-smelling liquid from the area and had gone for care there. his past medical history is significant for DVT, hypertension, gout, tobacco abuse, cocaine abuse, stroke, atrial fibrillation, pulmonary embolism. he has also had some vascular surgery with a stent placed in his leg. He has been a smoker for many years and has given up straight drugs several years ago. He continues to smoke about 4-5 cigarettes a day. 02/03/2015 -- received a note from 05/14/2013 where Dr. Leotis Pain placed an inferior vena cava filter. The patient had a deep vein thrombosis while  therapeutic on anticoagulation for previous DVT and a IVC filter was placed for this. 02/10/2015 -- he did have his vascular test done on Friday but we have no reports yet. Objective Constitutional Funke, Jaden ([id number]) Pulse regular. Respirations normal and unlabored. Afebrile. Vitals Time Taken: 8:10 AM, Height: 74 in, Weight: 238 lbs, BMI: 30.6, Temperature: 97.5 F, Pulse: 56 bpm, Respiratory Rate: 20 breaths/min, Blood Pressure: 130/68 mmHg. Eyes Nonicteric. Reactive to light. Ears, Nose, Mouth, and Throat Lips, teeth, and gums WNL.[name] Moist mucosa without lesions . Neck supple and nontender. No palpable supraclavicular or cervical adenopathy. Normal sized without goiter. Respiratory WNL. No retractions.. Cardiovascular Pedal Pulses WNL. No clubbing, cyanosis or edema. Lymphatic No adneopathy. No adenopathy. No adenopathy. Musculoskeletal Adexa without tenderness or enlargement.. Digits and nails w/o clubbing, cyanosis, infection, petechiae, ischemia, or inflammatory conditions.[name] Psychiatric Judgement and insight Intact.. No evidence of depression, anxiety, or agitation.. General Notes: The edema is well controlled and the ulcer on the right medial ankle is looking clean and no curettage was done. Integumentary (Hair, Skin) No suspicious lesions. No crepitus or fluctuance. No peri-wound warmth or erythema. No masses.. Wound #1 status is Open. Original cause of wound was Gradually Appeared. The wound is located on the Right,Distal,Medial Lower Leg. The wound measures 6.7cm length x 2.3cm width x 0.3cm depth; 12.103cm^2 area and 3.631cm^3 volume. The wound is limited to skin breakdown. There is no tunneling or undermining noted. There is a medium amount of serous drainage noted. The wound margin is flat and intact. There is large (67-100%) red, pink granulation within the wound bed. There is a small (1-33%) amount of necrotic tissue within the wound bed including  Adherent Slough. The periwound skin appearance exhibited: Dry/Scaly. The periwound skin appearance did not exhibit: Callus, Crepitus, Excoriation, Fluctuance, Friable, Induration, Localized Edema, Rash, Scarring, Maceration, Moist, Atrophie Blanche, Cyanosis, Ecchymosis, Hemosiderin Staining, Mottled, Pallor, Rubor, Erythema. Periwound temperature was noted as No Abnormality. The periwound has tenderness on palpation. 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 We will continue with silver alginate and an Unna's boots and see him back next week. We have again discussed elevation of the limb and depending on his reports will  refer him appropriately for endovenous ablation if required. Plan Wound Cleansing: Wound #1 Right,Distal,Medial Lower Leg: Clean wound with Normal Saline. Anesthetic: Wound #1 Right,Distal,Medial Lower Leg: Topical Lidocaine 4% cream applied to wound bed prior to debridement Primary Wound Dressing: Wound #1 Right,Distal,Medial Lower Leg: Aquacel Ag Secondary Dressing: Wound #1 Right,Distal,Medial Lower Leg: ABD pad Dressing Change Frequency: Wound #1 Right,Distal,Medial Lower Leg: Change dressing every week Follow-up Appointments: Wound #1 Right,Distal,Medial Lower Leg: Return Appointment in 1 week. Edema Control: Wound #1 Right,Distal,Medial Lower Leg: Unna Boot to Right Lower Extremity Elevate legs to the level of the heart and pump ankles as often as possible Novinger, Homer ([id number]) He had an Unna's boots applied on Friday  at the vein center and tolerated it well. We will continue with silver alginate and an Unna's boots and see him back next week. We have again discussed elevation of the limb and depending on his reports will refer him appropriately for endovenous ablation if required. Electronic Signature(s) Signed: 02/10/2015 8:42:15 AM By: Christin Fudge MD, FACS Entered By: Christin Fudge on 02/10/2015 08:42:15 Minella, [name] ([id number]) -------------------------------------------------------------------------------- SuperBill Details Patient Name: [name] Date of Service: 02/10/2015 Medical Record Number: [id number] Patient Account Number: [id number] Date of Birth/Sex: 07-14-1950 (64 y.o. 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 of ankle and inflammation L97.312 Non-pressure chronic ulcer of right ankle with fat layer exposed F17.209 Nicotine dependence, unspecified, with unspecified nicotine-induced disorders Facility Procedures CPT4 Code: [id number] Description: (Facility Use Only) [id number] - APPLY Louretta Parma BOOT RT Modifier: Quantity: 1 Physician Procedures CPT4: Description Modifier Quantity Code [id number] - WC PHYS LEVEL 3 - EST PT 1 ICD-10 Description Diagnosis 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

## 2015-02-17 ENCOUNTER — Encounter: Payer: Medicare Other | Attending: Surgery | Admitting: Surgery

## 2015-02-17 DIAGNOSIS — Z7901 Long term (current) use of anticoagulants: Secondary | ICD-10-CM | POA: Diagnosis not present

## 2015-02-17 DIAGNOSIS — I10 Essential (primary) hypertension: Secondary | ICD-10-CM | POA: Diagnosis not present

## 2015-02-17 DIAGNOSIS — L97312 Non-pressure chronic ulcer of right ankle with fat layer exposed: Secondary | ICD-10-CM | POA: Diagnosis present

## 2015-02-17 DIAGNOSIS — I83213 Varicose veins of right lower extremity with both ulcer of ankle and inflammation: Secondary | ICD-10-CM | POA: Diagnosis not present

## 2015-02-17 DIAGNOSIS — Z86718 Personal history of other venous thrombosis and embolism: Secondary | ICD-10-CM | POA: Diagnosis not present

## 2015-02-17 DIAGNOSIS — F17209 Nicotine dependence, unspecified, with unspecified nicotine-induced disorders: Secondary | ICD-10-CM | POA: Insufficient documentation

## 2015-02-17 NOTE — Progress Notes (Addendum)
RHYATT, MUSKA ([id number]) Visit Report for 02/17/2015 Chief Complaint Document Details Patient Name: Mark, [name] Date of Service: 02/17/2015 8:45 AM Medical Record Number: [id number] Patient Account Number: [id number] Date of Birth/Sex: 01/30/1951 (64 y.o. Male) Treating RN: Cornell Barman Primary Care Physician: PATIENT, NO Other Clinician: Referring Physician: Royetta Crochet Treating Physician/Extender: Frann Rider in Treatment: 3 Information Obtained from: Patient Chief Complaint Patient presents for treatment of an open ulcer due to venous insufficiency. He says he noticed a "into the right medial ankle for about 3-4 weeks Electronic Signature(s) Signed: 02/17/2015 9:11:26 AM By: Christin Fudge MD, FACS Entered By: Christin Fudge on 02/17/2015 09:11:26 [name], [name] ([id number]) -------------------------------------------------------------------------------- Debridement Details Patient Name: [name] Date of Service: 02/17/2015 8:45 AM Medical Record Number: [id number] Patient Account Number: [id number] Date of Birth/Sex: 29-May-1950 (64 y.o. Male) Treating RN: Cornell Barman Primary Care Physician: PATIENT, NO Other Clinician: Referring Physician: Royetta Crochet Treating Physician/Extender: Frann Rider in Treatment: 3 Debridement Performed for Wound #1 Right,Distal,Medial Lower Leg Assessment: Performed By: Physician Pat Patrick., MD Debridement: Debridement Pre-procedure Yes Verification/Time Out Taken: Start Time: 09:00 Pain Control: Other : lidocaine 4% Level: Skin/Subcutaneous Tissue Total Area Debrided (L x 6.2 (cm) x 2.1 (cm) = 13.02 (cm) W): Tissue and other Viable, Non-Viable, Exudate, Fibrin/Slough, Subcutaneous material debrided: Instrument: Curette Bleeding: Minimum Hemostasis Achieved: Pressure End Time: 09:06 Procedural Pain: 1 Post Procedural Pain: 1 Response to Treatment: Procedure was tolerated well Post  Debridement Measurements of Total Wound Length: (cm) 6.2 Width: (cm) 2.1 Depth: (cm) 0.3 Volume: (cm) 3.068 Post Procedure Diagnosis Same as Pre-procedure Electronic Signature(s) Signed: 02/17/2015 9:11:18 AM By: Christin Fudge MD, FACS Signed: 02/18/2015 5:33:23 PM By: Gretta Cool RN, [name], [name], [name] Entered By: Christin Fudge on 02/17/2015 [name], [name] ([id number]) -------------------------------------------------------------------------------- HPI Details Patient Name: [name] Date of Service: 02/17/2015 8:45 AM Medical Record Number: [id number] Patient Account Number: [id number] Date of Birth/Sex: [birth year] (64 y.o. Male) Treating RN: Cornell Barman Primary Care Physician: PATIENT, NO Other Clinician: Referring Physician: Royetta Crochet Treating Physician/Extender: Frann Rider in Treatment: 3 History of Present Illness Location: open wound just above his right ankle Quality: Patient reports experiencing a dull pain to affected area(s). Severity: Patient states wound are getting worse. Duration: Patient has had the wound for < 4 weeks prior to presenting for treatment Timing: Pain in wound is Intermittent (comes and goes Context: The wound appeared gradually over time Modifying Factors: Consults to this date include: he was seen in the ER and was referred to a vascular surgeon but the patient has not done that. He may have been treated with clindamycin in the ER. Associated Signs and Symptoms: Patient reports having difficulty standing for long periods. HPI Description: 64 year old gentleman who was seen in the emergency department recently on 01/06/2015 for a wound of his right lower extremity which he says was not involving any injury and he did not know how he sustained it. He had draining foul-smelling liquid from the area and had gone for care there. his past medical history is significant for DVT, hypertension, gout, tobacco abuse, cocaine abuse,  stroke, atrial fibrillation, pulmonary embolism. he has also had some vascular surgery with a stent placed in his leg. He has been a smoker for many years and has given up straight drugs several years ago. He continues to smoke about 4-5 cigarettes a day. 02/03/2015 -- received a note from 05/14/2013 where Dr. Leotis Pain placed an inferior vena cava filter. The patient had a deep vein thrombosis  while therapeutic on anticoagulation for previous DVT and a IVC filter was placed for this. 02/10/2015 -- he did have his vascular test done on Friday but we have no reports yet. 02/17/2015 -- notes were reviewed from the vascular office and the patient had a venous ultrasound done which revealed that he had no reflux in the greater saphenous vein or the short saphenous vein bilaterally. He did have subacute DVT in the common femoral vein and popliteal veins on the right and left side. The recommendation was to continue with Unna's boot therapy at the wound clinic and then to wear graduated compression stockings once the ulcers healed and later if he had continuous problems lymphedema pump would benefit him. Electronic Signature(s) Signed: 02/17/2015 9:11:35 AM By: Christin Fudge MD, FACS Previous Signature: 02/17/2015 8:59:51 AM Version By: Christin Fudge MD, FACS Entered By: Christin Fudge on 02/17/2015 09:11:34 [name], [name] ([id number]) -------------------------------------------------------------------------------- Physical Exam Details Patient Name: [name] Date of Service: 02/17/2015 8:45 AM Medical Record Number: [id number] Patient Account Number: [id number] Date of Birth/Sex: Aug 19, 1950 (64 y.o. Male) Treating RN: Cornell Barman Primary Care Physician: PATIENT, NO Other Clinician: Referring Physician: Royetta Crochet Treating Physician/Extender: Frann Rider in Treatment: 3 Constitutional . Pulse regular. Respirations normal and unlabored. Afebrile. . Eyes Nonicteric.  Reactive to light. Ears, Nose, Mouth, and Throat Lips, teeth, and gums WNL.[name] Moist mucosa without lesions . Neck supple and nontender. No palpable supraclavicular or cervical adenopathy. Normal sized without goiter. Respiratory WNL. No retractions.. Cardiovascular Pedal Pulses WNL. No clubbing, cyanosis or edema. Lymphatic No adneopathy. No adenopathy. No adenopathy. Musculoskeletal Adexa without tenderness or enlargement.. Digits and nails w/o clubbing, cyanosis, infection, petechiae, ischemia, or inflammatory conditions.. Integumentary (Hair, Skin) No suspicious lesions. No crepitus or fluctuance. No peri-wound warmth or erythema. No masses.[name] Psychiatric Judgement and insight Intact.. No evidence of depression, anxiety, or agitation.. Notes overall the patient is doing very well and the ulceration has slough which will need sharp debridement with a curette. The edema is well under control. Electronic Signature(s) Signed: 02/17/2015 9:12:04 AM By: Christin Fudge MD, FACS Entered By: Christin Fudge on 02/17/2015 09:12:04 [name], [name] ([id number]) -------------------------------------------------------------------------------- Physician Orders Details Patient Name: [name] Date of Service: 02/17/2015 8:45 AM Medical Record Number: [id number] Patient Account Number: [id number] Date of Birth/Sex: [birth year] (64 y.o. Male) Treating RN: Cornell Barman Primary Care Physician: PATIENT, NO Other Clinician: Referring Physician: Royetta Crochet Treating Physician/Extender: Frann Rider in Treatment: 3 Verbal / Phone Orders: Yes Clinician: Cornell Barman Read Back and Verified: Yes Diagnosis Coding Wound Cleansing Wound #1 Right,Distal,Medial Lower Leg o Clean wound with Normal Saline. Anesthetic Wound #1 Right,Distal,Medial Lower Leg o Topical Lidocaine 4% cream applied to wound bed prior to debridement Primary Wound Dressing Wound #1 Right,Distal,Medial Lower  Leg o Aquacel Ag Secondary Dressing Wound #1 Right,Distal,Medial Lower Leg o ABD pad Dressing Change Frequency Wound #1 Right,Distal,Medial Lower Leg o Change dressing every week Follow-up Appointments Wound #1 Right,Distal,Medial Lower Leg o Return Appointment in 1 week. Edema Control Wound #1 Right,Distal,Medial Lower Leg o Unna Boot to Right Lower Extremity - white paste o Elevate legs to the level of the heart and pump ankles as often as possible Electronic Signature(s) Signed: 02/17/2015 4:36:30 PM By: Christin Fudge MD, FACS Signed: 02/18/2015 5:33:23 PM By: Gretta Cool, RN, [name], [name], [name] Entered By: Gretta Cool, RN, [name], Kim on 02/17/2015 09:07:43 [name], [name] ([id number]) SHOJI, PERTUIT ([id number]) -------------------------------------------------------------------------------- Problem List Details Patient Name: [name] Date of Service: 02/17/2015 8:45 AM Medical Record Number: [id number]  Patient Account Number: [id number] Date of Birth/Sex: 1950-09-25 (64 y.o. 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 Male) Treating RN: Cornell Barman Primary Care Physician: PATIENT, NO Other Clinician: Referring Physician: Royetta Crochet Treating Physician/Extender: Frann Rider in Treatment: 3 Subjective Chief Complaint Information obtained from Patient Patient presents for treatment of an open ulcer due to venous insufficiency. He says he noticed a "into the right medial ankle for about 3-4 weeks History of Present Illness (HPI) The following HPI elements were documented for the patient's wound: Location: open wound just above his right ankle Quality: Patient reports experiencing a dull pain to affected area(s). Severity: Patient states wound are getting worse. Duration: Patient has had the wound for < 4 weeks prior to presenting for treatment Timing: Pain in wound is Intermittent (comes and goes Context: The wound appeared gradually over time Modifying Factors: Consults to this date include: he was seen in the ER and was referred to a vascular surgeon but the patient has not done that. He may have been treated with clindamycin in the ER. Associated Signs and Symptoms: Patient reports having difficulty standing for long periods. 64 year old gentleman who was seen in the emergency department recently on 01/06/2015 for a wound of his right lower extremity which he says was not involving any injury and he did not know how he sustained it. He had draining foul-smelling liquid from the area and had gone for care there. his past medical history is significant for DVT, hypertension, gout, tobacco abuse, cocaine abuse, stroke, atrial fibrillation, pulmonary embolism. he has also had some vascular surgery with a  stent placed in his leg. He has been a smoker for many years and has given up straight drugs several years ago. He continues to smoke about 4-5 cigarettes a day. 02/03/2015 -- received a note from 05/14/2013 where Dr. Leotis Pain placed an inferior vena cava filter. The patient had a deep vein thrombosis while therapeutic on anticoagulation for previous DVT and a IVC filter was placed for this. 02/10/2015 -- he did have his vascular test done on Friday but we have no reports yet. 02/17/2015 -- notes were reviewed from the vascular office and the patient had a venous ultrasound done which revealed that he had no reflux in the greater saphenous vein or the short saphenous vein bilaterally. He did have subacute DVT in the common femoral vein and popliteal veins on the right and left side. The recommendation was to continue with Unna's boot therapy at the wound clinic and then to wear graduated compression stockings once the ulcers healed and later if he had continuous problems lymphedema pump would benefit him. [name], [name] ([id number]) Objective Constitutional Pulse regular. Respirations normal and unlabored. Afebrile. Vitals Time Taken: 8:44 AM,  Height: 74 in, Weight: 238 lbs, BMI: 30.6, Temperature: 98.3 F, Pulse: 53 bpm, Respiratory Rate: 20 breaths/min, Blood Pressure: 127/83 mmHg. Eyes Nonicteric. Reactive to light. Ears, Nose, Mouth, and Throat Lips, teeth, and gums WNL.[name] Moist mucosa without lesions . Neck supple and nontender. No palpable supraclavicular or cervical adenopathy. Normal sized without goiter. Respiratory WNL. No retractions.. Cardiovascular Pedal Pulses WNL. No clubbing, cyanosis or edema. Lymphatic No adneopathy. No adenopathy. No adenopathy. Musculoskeletal Adexa without tenderness or enlargement.. Digits and nails w/o clubbing, cyanosis, infection, petechiae, ischemia, or inflammatory conditions.[name] Psychiatric Judgement and insight Intact.. No evidence  of depression, anxiety, or agitation.. General Notes: overall the patient is doing very well and the ulceration has slough which will need sharp debridement with a curette. The edema is well under control. Integumentary (Hair, Skin) No suspicious lesions. No crepitus or fluctuance. No peri-wound warmth or erythema. No masses.. Wound #1 status is Open. Original cause of wound was Gradually Appeared. The wound is located on the Right,Distal,Medial Lower Leg. The wound measures 6.2cm length x 2.1cm width x 0.3cm depth; 10.226cm^2 area and 3.068cm^3 volume. The wound is limited to skin breakdown. There is no tunneling or undermining noted. There is a medium amount of serous drainage noted. The wound margin is flat and intact. There is large (67-100%) red, pink granulation within the wound bed. There is a small (1-33%) amount of necrotic tissue within the wound bed including Adherent Slough. The periwound skin appearance [name], Mark ([id number]) exhibited: Maceration, Moist. The periwound skin appearance did not exhibit: Callus, Crepitus, Excoriation, Fluctuance, Friable, Induration, Localized Edema, Rash, Scarring, Dry/Scaly, Atrophie Blanche, Cyanosis, Ecchymosis, Hemosiderin Staining, Mottled, Pallor, Rubor, Erythema. Periwound temperature was noted as No Abnormality. The periwound has tenderness on palpation. 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 The results of the vascular workup available have been discussed with him and his sister was at the bedside today in great detail and they  understand the treatment plan. He will come back to see as next week. Procedures Wound #1 Wound #1 is a Venous Leg Ulcer located on the Right,Distal,Medial Lower Leg . There was a Skin/Subcutaneous Tissue Debridement ([id number]) debridement with total area of 13.02 sq cm performed by Jernard Reiber, Jackson Latino., MD. with the following instrument(s): Curette to remove Viable and Non-Viable tissue/material including Exudate, Fibrin/Slough, and Subcutaneous after achieving pain control using Other (lidocaine 4%). A time out was conducted prior to the start of the procedure. A Minimum amount of bleeding was controlled with Pressure. The procedure was tolerated well with a pain level of 1 throughout and a pain level of 1 following the procedure. Post Debridement Measurements: 6.2cm length x 2.1cm width x 0.3cm depth; 3.068cm^3 volume. Post procedure Diagnosis Wound #1: Same as Pre-Procedure Plan Wound Cleansing: [name], [name] ([id number]) Wound #1 Right,Distal,Medial Lower Leg: Clean wound with Normal Saline. Anesthetic: Wound #1 Right,Distal,Medial Lower Leg: Topical Lidocaine 4% cream applied to wound bed prior to debridement Primary Wound Dressing: Wound #1 Right,Distal,Medial Lower Leg: Aquacel Ag Secondary Dressing: Wound #1 Right,Distal,Medial Lower Leg: ABD pad Dressing Change Frequency: Wound #1 Right,Distal,Medial Lower Leg: Change dressing every week Follow-up Appointments: Wound #1 Right,Distal,Medial Lower Leg: Return Appointment  in 1 week. Edema Control: Wound #1 Right,Distal,Medial Lower Leg: Unna Boot to Right Lower Extremity - white paste Elevate legs to the level of the heart and pump ankles as often as possible I have recommended we continue with silver alginate and application of an Unna's boot. The results of the vascular workup available have been discussed with him and his sister was at the bedside today in great detail and they understand the treatment plan. He  will come back to see as next week. Electronic Signature(s) Signed: 02/17/2015 9:13:04 AM By: Christin Fudge MD, FACS Entered By: Christin Fudge on 02/17/2015 09:13:04 Rockett, [name] ([id number]) -------------------------------------------------------------------------------- SuperBill Details Patient Name: [name] Date of Service: 02/17/2015 Medical Record Number: [id number] Patient Account Number: [id number] Date of Birth/Sex: Jan 08, 1951 (64 y.o. 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 and inflammation L97.312 Non-pressure chronic ulcer of right ankle with fat layer exposed F17.209 Nicotine dependence, unspecified, with unspecified nicotine-induced disorders Facility Procedures CPT4: Description Modifier Quantity Code [id number] 11042 - DEB SUBQ TISSUE 20 SQ CM/< 1 ICD-10 Description Diagnosis 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 I83.213 Varicose veins of right lower extremity with both ulcer of  ankle and inflammation L97.312 Non-pressure chronic ulcer of right ankle with fat layer exposed F17.209 Nicotine dependence, unspecified, with unspecified nicotine-induced disorders Physician Procedures CPT4: Description Modifier Quantity Code [id number] 23343 - WC PHYS SUBQ TISS 20 SQ CM 1 ICD-10 Description Diagnosis 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

## 2015-02-18 NOTE — Progress Notes (Signed)
ALFORD, GAMERO ([id number]) Visit Report for 02/10/2015 Arrival Information Details Patient Name: [name], [name] Date of Service: 02/10/2015 8:00 AM Medical Record Number: [id number] Patient Account Number: [id number] Date of Birth/Sex: 1950-12-14 (64 y.o. Male) Treating RN: Junious Dresser Primary Care Physician: PATIENT, NO Other Clinician: Referring Physician: Royetta Crochet Treating Physician/Extender: Frann Rider in Treatment: 2 Visit Information History Since Last Visit Added or deleted any medications: No Patient Arrived: Ambulatory Any new allergies or adverse reactions: No Arrival Time: 08:08 Had a fall or experienced change in No Accompanied By: self activities of daily living that may affect Transfer Assistance: None risk of falls: Patient Identification Verified: Yes Signs or symptoms of abuse/neglect since last No Secondary Verification Process Yes visito Completed: Hospitalized since last visit: No Patient Has Alerts: Yes Has Dressing in Place as Prescribed: No Patient Alerts: Patient on Blood Pain Present Now: Yes Thinner warfarin ABI L 1.22 R 1.15 Electronic Signature(s) Signed: 02/17/2015 4:32:11 PM By: Junious Dresser RN Entered By: Junious Dresser on 02/10/2015 08:11:02 [name], [name] ([id number]) -------------------------------------------------------------------------------- Encounter Discharge Information Details Patient Name: [name] Date of Service: 02/10/2015 8:00 AM Medical Record Number: [id number] Patient Account Number: [id number] Date of Birth/Sex: 22-Jul-1950 (64 y.o. Male) Treating RN: Cornell Barman Primary Care Physician: PATIENT, NO Other Clinician: Referring Physician: Royetta Crochet Treating Physician/Extender: Frann Rider in Treatment: 2 Encounter Discharge Information Items Discharge Condition: Stable Ambulatory Status: Ambulatory Discharge Destination: Home Transportation: Private Auto Accompanied By:  self Schedule Follow-up Appointment: Yes Medication Reconciliation completed and provided to Patient/Care No Provider: Provided on Clinical Summary of Care: 02/10/2015 Form Type Recipient Paper Patient WD Electronic Signature(s) Signed: 02/17/2015 4:32:11 PM By: Junious Dresser RN Previous Signature: 02/10/2015 8:49:11 AM Version By: Ruthine Dose Entered By: Junious Dresser on 02/10/2015 09:00:58 Mottern, [name] ([id number]) -------------------------------------------------------------------------------- Lower Extremity Assessment Details Patient Name: [name] Date of Service: 02/10/2015 8:00 AM Medical Record Number: [id number] Patient Account Number: [id number] Date of Birth/Sex: Jan 31, 1951 (64 y.o. Male) Treating RN: Junious Dresser Primary Care Physician: PATIENT, NO Other Clinician: Referring Physician: Royetta Crochet Treating Physician/Extender: Frann Rider in Treatment: 2 Edema Assessment Assessed: [Left: No] [Right: Yes] Edema: [Left: Ye] [Right: s] Calf Left: Right: Point of Measurement: 39 cm From Medial Instep cm 44 cm Ankle Left: Right: Point of Measurement: 12 cm From Medial Instep cm 29.7 cm Vascular Assessment Claudication: Claudication Assessment [Right:None] Pulses: Posterior Tibial Palpable: [Right:Yes] Dorsalis Pedis Palpable: [Right:Yes] Extremity colors, hair growth, and conditions: Extremity Color: [Right:Hyperpigmented] Hair Growth on Extremity: [Right:Yes] Temperature of Extremity: [Right:Warm] Capillary Refill: [Right:< 3 seconds] Dependent Rubor: [Right:No] Blanched when Elevated: [Right:No] Toe Nail Assessment Left: Right: Thick: Yes Discolored: Yes Deformed: Yes Improper Length and Hygiene: No Tofte, [name] ([id number]) Electronic Signature(s) Signed: 02/17/2015 4:32:11 PM By: Junious Dresser RN Entered By: Junious Dresser on 02/10/2015 08:18:36 Caputi, [name]  ([id number]) -------------------------------------------------------------------------------- Multi Wound Chart Details Patient Name: [name] Date of Service: 02/10/2015 8:00 AM Medical Record Number: [id number] Patient Account Number: [id number] Date of Birth/Sex: [birth year] (64 y.o. Male) Treating RN: Cornell Barman Primary Care Physician: PATIENT, NO Other Clinician: Referring Physician: Royetta Crochet Treating Physician/Extender: Frann Rider in Treatment: 2 Vital Signs Height(in): 74 Pulse(bpm): 56 Weight(lbs): 238 Blood Pressure 130/68 (mmHg): Body Mass Index(BMI): 31 Temperature(F): 97.5 Respiratory Rate 20 (breaths/min): Photos: [1:No Photos] [N/A:N/A] Wound Location: [1:Right Lower Leg - Medial, N/A Distal] Wounding Event: [1:Gradually Appeared] [N/A:N/A] Primary Etiology: [1:Venous Leg Ulcer] [N/A:N/A] Comorbid History: [1:Arrhythmia, Hypertension, N/A Gout, Neuropathy] Date Acquired: [1:01/06/2015] [N/A:N/A] Weeks of Treatment: [1:2] [N/A:N/A] Wound Status: [  1:Open] [N/A:N/A] Measurements L x W x D 6.7x2.3x0.3 [N/A:N/A] (cm) Area (cm) : [1:12.103] [N/A:N/A] Volume (cm) : [1:3.631] [N/A:N/A] % Reduction in Area: [1:-45.60%] [N/A:N/A] % Reduction in Volume: -9.20% [N/A:N/A] Classification: [1:Full Thickness Without Exposed Support Structures] [N/A:N/A] Exudate Amount: [1:Medium] [N/A:N/A] Exudate Type: [1:Serous] [N/A:N/A] Exudate Color: [1:amber] [N/A:N/A] Wound Margin: [1:Flat and Intact] [N/A:N/A] Granulation Amount: [1:Large (67-100%)] [N/A:N/A] Granulation Quality: [1:Red, Pink] [N/A:N/A] Necrotic Amount: [1:Small (1-33%)] [N/A:N/A] Exposed Structures: [1:Fascia: No Fat: No Tendon: No Muscle: No] [N/A:N/A] Joint: No Bone: No Limited to Skin Breakdown Epithelialization: Small (1-33%) N/A N/A Periwound Skin Texture: Edema: No N/A N/A Excoriation: No Induration: No Callus: No Crepitus: No Fluctuance: No Friable: No Rash:  No Scarring: No Periwound Skin Dry/Scaly: Yes N/A N/A Moisture: Maceration: No Moist: No Periwound Skin Color: Atrophie Blanche: No N/A N/A Cyanosis: No Ecchymosis: No Erythema: No Hemosiderin Staining: No Mottled: No Pallor: No Rubor: No Temperature: No Abnormality N/A N/A Tenderness on Yes N/A N/A Palpation: Wound Preparation: Ulcer Cleansing: N/A N/A Rinsed/Irrigated with Saline, Other: soap and water Topical Anesthetic Applied: Other: lidocaine 4% Treatment Notes Electronic Signature(s) Signed: 02/11/2015 5:26:34 PM By: Gretta Cool, RN, [name], [name], [name] Entered By: Gretta Cool, RN, [name], Kim on 02/10/2015 08:34:33 Fairley, [name] ([id number]) -------------------------------------------------------------------------------- Multi-Disciplinary Care Plan Details Patient Name: [name] Date of Service: 02/10/2015 8:00 AM Medical Record Number: [id number] Patient Account Number: [id number] Date of Birth/Sex: 10/26/1950 (64 y.o. Male) Treating RN: Cornell Barman Primary Care Physician: PATIENT, NO Other Clinician: Referring Physician: Royetta Crochet Treating Physician/Extender: Frann Rider in Treatment: 2 Active Inactive Abuse / Safety / Falls / Self Care Management Nursing Diagnoses: Impaired physical mobility Potential for falls Goals: Patient will remain injury free Date Initiated: 01/27/2015 Goal Status: Active Interventions: Assess fall risk on admission and as needed Notes: Orientation to the Wound Care Program Nursing Diagnoses: Knowledge deficit related to the wound healing center program Goals: Patient/caregiver will verbalize understanding of the [facility] Date Initiated: 01/27/2015 Goal Status: Active Interventions: Provide education on orientation to the wound center Notes: Venous Leg Ulcer Nursing Diagnoses: Potential for venous Insuffiency (use before diagnosis confirmed) Goals: Non-invasive venous studies are completed  as ordered JW, COVIN ([id number]) Date Initiated: 01/27/2015 Goal Status: Active Patient will maintain optimal edema control Date Initiated: 01/27/2015 Goal Status: Active Interventions: Assess peripheral edema status every visit. Compression as ordered Notes: Wound/Skin Impairment Nursing Diagnoses: Impaired tissue integrity Goals: Ulcer/skin breakdown will have a volume reduction of 30% by week 4 Date Initiated: 01/27/2015 Goal Status: Active Ulcer/skin breakdown will have a volume reduction of 50% by week 8 Date Initiated: 01/27/2015 Goal Status: Active Ulcer/skin breakdown will have a volume reduction of 80% by week 12 Date Initiated: 01/27/2015 Goal Status: Active Ulcer/skin breakdown will heal within 14 weeks Date Initiated: 01/27/2015 Goal Status: Active Interventions: Assess ulceration(s) every visit Notes: Electronic Signature(s) Signed: 02/11/2015 5:26:34 PM By: Gretta Cool, RN, [name], [name], [name] Entered By: Gretta Cool, RN, [name], Kim on 02/10/2015 08:34:23 Kent, [name] ([id number]) -------------------------------------------------------------------------------- Pain Assessment Details Patient Name: [name] Date of Service: 02/10/2015 8:00 AM Medical Record Number: [id number] Patient Account Number: [id number] Date of Birth/Sex: 01-18-1951 (64 y.o. Male) Treating RN: Junious Dresser Primary Care Physician: PATIENT, NO Other Clinician: Referring Physician: Royetta Crochet Treating Physician/Extender: Frann Rider in Treatment: 2 Active Problems Location of Pain Severity and Description of Pain Patient Has Paino Yes Site Locations Pain Location: Pain in Ulcers With Dressing Change: Yes Duration of the Pain. Constant / Intermittento Constant Rate the pain. Current  Pain Level: 10 Worst Pain Level: 10 Least Pain Level: 8 Character of Pain Describe the Pain: Burning Pain Management and Medication Current Pain Management: Electronic  Signature(s) Signed: 02/17/2015 4:32:11 PM By: Junious Dresser RN Entered By: Junious Dresser on 02/10/2015 08:11:56 Mccowan, [name] ([id number]) -------------------------------------------------------------------------------- Patient/Caregiver Education Details Patient Name: [name] Date of Service: 02/10/2015 8:00 AM Medical Record Number: [id number] Patient Account Number: [id number] Date of Birth/Gender: 05/19/1950 (64 y.o. Male) Treating RN: Junious Dresser Primary Care Physician: PATIENT, NO Other Clinician: Referring Physician: Royetta Crochet Treating Physician/Extender: Frann Rider in Treatment: 2 Education Assessment Education Provided To: Patient Education Topics Provided Venous: Methods: Explain/Verbal Responses: State content correctly Wound Debridement: Methods: Explain/Verbal Responses: State content correctly Wound/Skin Impairment: Methods: Explain/Verbal Responses: State content correctly Electronic Signature(s) Signed: 02/17/2015 4:32:11 PM By: Junious Dresser RN Entered By: Junious Dresser on 02/10/2015 09:01:16 Tarlton, [name] ([id number]) -------------------------------------------------------------------------------- Wound Assessment Details Patient Name: [name] Date of Service: 02/10/2015 8:00 AM Medical Record Number: [id number] Patient Account Number: [id number] Date of Birth/Sex: 03/21/1951 (64 y.o. Male) Treating RN: Junious Dresser Primary Care Physician: PATIENT, NO Other Clinician: Referring Physician: Royetta Crochet Treating Physician/Extender: Frann Rider in Treatment: 2 Wound Status Wound Number: 1 Primary Venous Leg Ulcer Etiology: Wound Location: Right Lower Leg - Medial, Distal Wound Status: Open Wounding Event: Gradually Appeared Comorbid Arrhythmia, Hypertension, Gout, History: Neuropathy Date Acquired: 01/06/2015 Weeks Of Treatment: 2 Clustered Wound: No Photos Photo Uploaded By: Junious Dresser on  02/10/2015 16:30:19 Wound Measurements Length: (cm) 6.7 Width: (cm) 2.3 Depth: (cm) 0.3 Area: (cm) 12.103 Volume: (cm) 3.631 % Reduction in Area: -45.6% % Reduction in Volume: -9.2% Epithelialization: Small (1-33%) Tunneling: No Undermining: No Wound Description Full Thickness Without Exposed Foul Odor After Classification: Support Structures Wound Margin: Flat and Intact Exudate Medium Amount: Exudate Type: Serous Exudate Color: amber Cleansing: No Wound Bed Granulation Amount: Large (67-100%) Exposed Structure Granulation Quality: Red, Pink Fascia Exposed: No Kushner, Durk ([id number]) Necrotic Amount: Small (1-33%) Fat Layer Exposed: No Necrotic Quality: Adherent Slough Tendon Exposed: No Muscle Exposed: No Joint Exposed: No Bone Exposed: No Limited to Skin Breakdown Periwound Skin Texture Texture Color No Abnormalities Noted: No No Abnormalities Noted: No Callus: No Atrophie Blanche: No Crepitus: No Cyanosis: No Excoriation: No Ecchymosis: No Fluctuance: No Erythema: No Friable: No Hemosiderin Staining: No Induration: No Mottled: No Localized Edema: No Pallor: No Rash: No Rubor: No Scarring: No Temperature / Pain Moisture Temperature: No Abnormality No Abnormalities Noted: No Tenderness on Palpation: Yes Dry / Scaly: Yes Maceration: No Moist: No Wound Preparation Ulcer Cleansing: Rinsed/Irrigated with Saline, Other: soap and water, Topical Anesthetic Applied: Other: lidocaine 4%, Electronic Signature(s) Signed: 02/17/2015 4:32:11 PM By: Junious Dresser RN Entered By: Junious Dresser on 02/10/2015 08:27:12 Mast, [name] ([id number]) -------------------------------------------------------------------------------- Vitals Details Patient Name: [name] Date of Service: 02/10/2015 8:00 AM Medical Record Number: [id number] Patient Account Number: [id number] Date of Birth/Sex: Jul 11, 1950 (64 y.o. Male) Treating RN: Junious Dresser Primary Care Physician: PATIENT, NO Other Clinician: Referring Physician: Royetta Crochet Treating Physician/Extender: Frann Rider in Treatment: 2 Vital Signs Time Taken: 08:10 Temperature (F): 97.5 Height (in): 74 Pulse (bpm): 56 Weight (lbs): 238 Respiratory Rate (breaths/min): 20 Body Mass Index (BMI): 30.6 Blood Pressure (mmHg): 130/68 Reference Range: 80 - 120 mg / dl Electronic Signature(s) Signed: 02/17/2015 4:32:11 PM By: Junious Dresser RN Entered By: Junious Dresser on 02/10/2015 08:12:15

## 2015-02-19 NOTE — Progress Notes (Signed)
[name] ([id number]) Visit Report for 02/17/2015 Arrival Information Details Patient Name: [name], [name] Date of Service: 02/17/2015 8:45 AM Medical Record Number: [id number] Patient Account Number: [id number] Date of Birth/Sex: 1951/02/08 (64 y.o. Male) Treating RN: Junious Dresser Primary Care Physician: PATIENT, NO Other Clinician: Referring Physician: Royetta Crochet Treating Physician/Extender: Frann Rider in Treatment: 3 Visit Information History Since Last Visit Added or deleted any medications: No Patient Arrived: Ambulatory Any new allergies or adverse reactions: No Arrival Time: 08:43 Had a fall or experienced change in No Accompanied By: sister activities of daily living that may affect Transfer Assistance: None risk of falls: Patient Identification Verified: Yes Signs or symptoms of abuse/neglect since last No Secondary Verification Process Yes visito Completed: Hospitalized since last visit: No Patient Has Alerts: Yes Has Dressing in Place as Prescribed: Yes Patient Alerts: Patient on Blood Has Compression in Place as Prescribed: Yes Thinner Pain Present Now: Yes warfarin ABI L 1.22 R 1.15 Electronic Signature(s) Signed: 02/17/2015 4:32:26 PM By: Junious Dresser RN Entered By: Junious Dresser on 02/17/2015 09:31:49 [name], [name] ([id number]) -------------------------------------------------------------------------------- Encounter Discharge Information Details Patient Name: [name] Date of Service: 02/17/2015 8:45 AM Medical Record Number: [id number] Patient Account Number: [id number] Date of Birth/Sex: [birth year] (64 y.o. Male) Treating RN: Cornell Barman Primary Care Physician: PATIENT, NO Other Clinician: Referring Physician: Royetta Crochet Treating Physician/Extender: Frann Rider in Treatment: 3 Encounter Discharge Information Items Discharge Condition: Stable Ambulatory Status: Ambulatory Discharge Destination:  Home Transportation: Private Auto Accompanied By: sister Schedule Follow-up Appointment: Yes Medication Reconciliation completed and provided to Patient/Care No Marquice Uddin: Provided on Clinical Summary of Care: 02/17/2015 Form Type Recipient Paper Patient DW Electronic Signature(s) Signed: 02/17/2015 4:32:26 PM By: Junious Dresser RN Previous Signature: 02/17/2015 9:19:05 AM Version By: Ruthine Dose Entered By: Junious Dresser on 02/17/2015 09:31:41 [name], [name] ([id number]) -------------------------------------------------------------------------------- Lower Extremity Assessment Details Patient Name: [name] Date of Service: 02/17/2015 8:45 AM Medical Record Number: [id number] Patient Account Number: [id number] Date of Birth/Sex: 12-Sep-1950 (64 y.o. Male) Treating RN: Junious Dresser Primary Care Physician: PATIENT, NO Other Clinician: Referring Physician: Royetta Crochet Treating Physician/Extender: Frann Rider in Treatment: 3 Edema Assessment Assessed: [Left: No] [Right: Yes] Edema: [Left: Ye] [Right: s] Calf Left: Right: Point of Measurement: 39 cm From Medial Instep cm 43.7 cm Ankle Left: Right: Point of Measurement: 12 cm From Medial Instep cm 29 cm Vascular Assessment Claudication: Claudication Assessment [Right:None] Pulses: Posterior Tibial Palpable: [Right:Yes] Dorsalis Pedis Palpable: [Right:Yes] Extremity colors, hair growth, and conditions: Extremity Color: [Right:Hyperpigmented] Hair Growth on Extremity: [Right:Yes] Temperature of Extremity: [Right:Warm] Capillary Refill: [Right:< 3 seconds] Dependent Rubor: [Right:No] Blanched when Elevated: [Right:No] Toe Nail Assessment Left: Right: Thick: Yes Discolored: Yes Deformed: Yes Improper Length and Hygiene: Yes [name], [name] ([id number]) Electronic Signature(s) Signed: 02/17/2015 4:32:26 PM By: Junious Dresser RN Entered By: Junious Dresser on 02/17/2015 08:57:11 [name],  [name] ([id number]) -------------------------------------------------------------------------------- Multi Wound Chart Details Patient Name: [name] Date of Service: 02/17/2015 8:45 AM Medical Record Number: [id number] Patient Account Number: [id number] Date of Birth/Sex: 1950-11-13 (64 y.o. Male) Treating RN: Cornell Barman Primary Care Physician: PATIENT, NO Other Clinician: Referring Physician: Royetta Crochet Treating Physician/Extender: Frann Rider in Treatment: 3 Vital Signs Height(in): 74 Pulse(bpm): 53 Weight(lbs): 238 Blood Pressure 127/83 (mmHg): Body Mass Index(BMI): 31 Temperature(F): 98.3 Respiratory Rate 20 (breaths/min): Photos: [1:No Photos] [N/A:N/A] Wound Location: [1:Right Lower Leg - Medial, N/A Distal] Wounding Event: [1:Gradually Appeared] [N/A:N/A] Primary Etiology: [1:Venous Leg Ulcer] [N/A:N/A] Comorbid History: [1:Arrhythmia, Hypertension, N/A Gout, Neuropathy] Date Acquired: [1:01/06/2015] [N/A:N/A]  Weeks of Treatment: [1:3] [N/A:N/A] Wound Status: [1:Open] [N/A:N/A] Measurements L x W x D 6.2x2.1x0.3 [N/A:N/A] (cm) Area (cm) : [1:10.226] [N/A:N/A] Volume (cm) : [1:3.068] [N/A:N/A] % Reduction in Area: [1:-23.10%] [N/A:N/A] % Reduction in Volume: 7.70% [N/A:N/A] Classification: [1:Full Thickness Without Exposed Support Structures] [N/A:N/A] Exudate Amount: [1:Medium] [N/A:N/A] Exudate Type: [1:Serous] [N/A:N/A] Exudate Color: [1:amber] [N/A:N/A] Wound Margin: [1:Flat and Intact] [N/A:N/A] Granulation Amount: [1:Large (67-100%)] [N/A:N/A] Granulation Quality: [1:Red, Pink] [N/A:N/A] Necrotic Amount: [1:Small (1-33%)] [N/A:N/A] Exposed Structures: [1:Fascia: No Fat: No Tendon: No Muscle: No] [N/A:N/A] Joint: No Bone: No Limited to Skin Breakdown Epithelialization: Small (1-33%) N/A N/A Periwound Skin Texture: Edema: No N/A N/A Excoriation: No Induration: No Callus: No Crepitus: No Fluctuance: No Friable: No Rash:  No Scarring: No Periwound Skin Maceration: Yes N/A N/A Moisture: Moist: Yes Dry/Scaly: No Periwound Skin Color: Atrophie Blanche: No N/A N/A Cyanosis: No Ecchymosis: No Erythema: No Hemosiderin Staining: No Mottled: No Pallor: No Rubor: No Temperature: No Abnormality N/A N/A Tenderness on Yes N/A N/A Palpation: Wound Preparation: Ulcer Cleansing: N/A N/A Rinsed/Irrigated with Saline Topical Anesthetic Applied: Other: lidocaine 4% Treatment Notes Electronic Signature(s) Signed: 02/18/2015 5:33:23 PM By: Gretta Cool, RN, [name], [name], [name] Entered By: Gretta Cool, RN, [name], Kim on 02/17/2015 09:04:01 [name], [name] ([id number]) -------------------------------------------------------------------------------- Multi-Disciplinary Care Plan Details Patient Name: [name] Date of Service: 02/17/2015 8:45 AM Medical Record Number: [id number] Patient Account Number: [id number] Date of Birth/Sex: 1950-07-17 (64 y.o. Male) Treating RN: Cornell Barman Primary Care Physician: PATIENT, NO Other Clinician: Referring Physician: Royetta Crochet Treating Physician/Extender: Frann Rider in Treatment: 3 Active Inactive Abuse / Safety / Falls / Self Care Management Nursing Diagnoses: Impaired physical mobility Potential for falls Goals: Patient will remain injury free Date Initiated: 01/27/2015 Goal Status: Active Interventions: Assess fall risk on admission and as needed Notes: Orientation to the Wound Care Program Nursing Diagnoses: Knowledge deficit related to the wound healing center program Goals: Patient/caregiver will verbalize understanding of the [place] Program Date Initiated: 01/27/2015 Goal Status: Active Interventions: Provide education on orientation to the wound center Notes: Venous Leg Ulcer Nursing Diagnoses: Potential for venous Insuffiency (use before diagnosis confirmed) Goals: Non-invasive venous studies are completed as ordered [name], [name] ([id number]) Date Initiated: 01/27/2015 Goal Status: Active Patient will maintain optimal edema control Date Initiated: 01/27/2015 Goal Status: Active Interventions: Assess peripheral edema status every visit. Compression as ordered Notes: Wound/Skin Impairment Nursing Diagnoses: Impaired tissue integrity Goals: Ulcer/skin breakdown will have a volume reduction of 30% by week 4 Date Initiated: 01/27/2015 Goal Status: Active Ulcer/skin breakdown will have a volume reduction of 50% by week 8 Date Initiated: 01/27/2015 Goal Status: Active Ulcer/skin breakdown will have a volume reduction of 80% by week 12 Date Initiated: 01/27/2015 Goal Status: Active Ulcer/skin breakdown will heal within 14 weeks Date Initiated: 01/27/2015 Goal Status: Active Interventions: Assess ulceration(s) every visit Notes: Electronic Signature(s) Signed: 02/18/2015 5:33:23 PM By: Gretta Cool, RN, [name], [name], [name] Entered By: Gretta Cool, RN, [name], Kim on 02/17/2015 09:03:53 [name], [name] ([id number]) -------------------------------------------------------------------------------- Pain Assessment Details Patient Name: [name] Date of Service: 02/17/2015 8:45 AM Medical Record Number: [id number] Patient Account Number: [id number] Date of Birth/Sex: 03/16/1951 (64 y.o. Male) Treating RN: Junious Dresser Primary Care Physician: PATIENT, NO Other Clinician: Referring Physician: Royetta Crochet Treating Physician/Extender: Frann Rider in Treatment: 3 Active Problems Location of Pain Severity and Description of Pain Patient Has Paino Yes Site Locations Pain Location: Pain in Ulcers With Dressing Change: Yes Duration of the Pain. Constant / Intermittento Constant Rate  the pain. Current Pain Level: 10 Worst Pain Level: 10 Least Pain Level: 8 Pain Management and Medication Current Pain Management: Electronic Signature(s) Signed: 02/17/2015 4:32:26 PM By: Junious Dresser RN Entered By: Junious Dresser on 02/17/2015 08:44:35 [name], [name] ([id number]) -------------------------------------------------------------------------------- Patient/Caregiver Education Details Patient Name: [name] Date of Service: 02/17/2015 8:45 AM Medical Record Number: [id number] Patient Account Number: [id number] Date of Birth/Gender: 1950-11-13 (64 y.o. Male) Treating RN: Junious Dresser Primary Care Physician: PATIENT, NO Other Clinician: Referring Physician: Royetta Crochet Treating Physician/Extender: Frann Rider in Treatment: 3 Education Assessment Education Provided To: Patient Education Topics Provided Venous: Methods: Explain/Verbal Responses: State content correctly Wound Debridement: Methods: Explain/Verbal Responses: State content correctly Wound/Skin Impairment: Methods: Explain/Verbal Responses: State content correctly Electronic Signature(s) Signed: 02/17/2015 4:32:26 PM By: Junious Dresser RN Entered By: Junious Dresser on 02/17/2015 09:32:09 [name], [name] ([id number]) -------------------------------------------------------------------------------- Wound Assessment Details Patient Name: [name] Date of Service: 02/17/2015 8:45 AM Medical Record Number: [id number] Patient Account Number: [id number] Date of Birth/Sex: 1951/02/13 (64 y.o. Male) Treating RN: Junious Dresser Primary Care Physician: PATIENT, NO Other Clinician: Referring Physician: Royetta Crochet Treating Physician/Extender: Frann Rider in Treatment: 3 Wound Status Wound Number: 1 Primary Venous Leg Ulcer Etiology: Wound Location: Right Lower Leg - Medial, Distal Wound Status: Open Wounding Event: Gradually Appeared Comorbid Arrhythmia, Hypertension, Gout, History: Neuropathy Date Acquired: 01/06/2015 Weeks Of Treatment: 3 Clustered Wound: No Photos Photo Uploaded By: Junious Dresser on 02/17/2015 16:40:46 Wound Measurements Length: (cm) 6.2 Width: (cm) 2.1 Depth: (cm)  0.3 Area: (cm) 10.226 Volume: (cm) 3.068 % Reduction in Area: -23.1% % Reduction in Volume: 7.7% Epithelialization: Small (1-33%) Tunneling: No Undermining: No Wound Description Full Thickness Without Exposed Foul Odor After Classification: Support Structures Wound Margin: Flat and Intact Medium [name], [name] ([id number]) Cleansing: No Exudate Amount: Exudate Type: Serous Exudate Color: amber Wound Bed Granulation Amount: Large (67-100%) Exposed Structure Granulation Quality: Red, Pink Fascia Exposed: No Necrotic Amount: Small (1-33%) Fat Layer Exposed: No Necrotic Quality: Adherent Slough Tendon Exposed: No Muscle Exposed: No Joint Exposed: No Bone Exposed: No Limited to Skin Breakdown Periwound Skin Texture Texture Color No Abnormalities Noted: No No Abnormalities Noted: No Callus: No Atrophie Blanche: No Crepitus: No Cyanosis: No Excoriation: No Ecchymosis: No Fluctuance: No Erythema: No Friable: No Hemosiderin Staining: No Induration: No Mottled: No Localized Edema: No Pallor: No Rash: No Rubor: No Scarring: No Temperature / Pain Moisture Temperature: No Abnormality No Abnormalities Noted: No Tenderness on Palpation: Yes Dry / Scaly: No Maceration: Yes Moist: Yes Wound Preparation Ulcer Cleansing: Rinsed/Irrigated with Saline Topical Anesthetic Applied: Other: lidocaine 4%, Treatment Notes Wound #1 (Right, Distal, Medial Lower Leg) 1. Cleansed with: Clean wound with Normal Saline 2. Anesthetic Topical Lidocaine 4% cream to wound bed prior to debridement 4. Dressing Applied: Aquacel Ag 5. Secondary Dressing Applied Fanguy, Shaquil ([id number]) ABD and Kerlix/Conform 7. Secured with Rolena Infante to Right Lower Extremity Electronic Signature(s) Signed: 02/17/2015 4:32:26 PM By: Junious Dresser RN Entered By: Junious Dresser on 02/17/2015 09:00:42 [name], [name]  ([id number]) -------------------------------------------------------------------------------- [place] Details Patient Name: [name] Date of Service: 02/17/2015 8:45 AM Medical Record Number: [id number] Patient Account Number: [id number] Date of Birth/Sex: Dec 15, 1950 (64 y.o. Male) Treating RN: Junious Dresser Primary Care Physician: PATIENT, NO Other Clinician: Referring Physician: Royetta Crochet Treating Physician/Extender: Frann Rider in Treatment: 3 Vital Signs Time Taken: 08:44 Temperature (F): 98.3 Height (in): 74 Pulse (bpm): 53 Weight (lbs): 238 Respiratory Rate (breaths/min): 20 Body Mass Index (BMI): 30.6 Blood Pressure (mmHg): 127/83 Reference Range:  80 - 120 mg / dl Electronic Signature(s) Signed: 02/17/2015 4:32:26 PM By: Junious Dresser RN Entered By: Junious Dresser on 02/17/2015 08:44:49

## 2015-02-24 ENCOUNTER — Encounter: Payer: Self-pay | Admitting: General Surgery

## 2015-02-24 ENCOUNTER — Encounter (HOSPITAL_BASED_OUTPATIENT_CLINIC_OR_DEPARTMENT_OTHER): Payer: Medicare Other | Admitting: General Surgery

## 2015-02-24 DIAGNOSIS — I83023 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: Secondary | ICD-10-CM | POA: Insufficient documentation

## 2015-02-24 DIAGNOSIS — L97329 Non-pressure chronic ulcer of left ankle with unspecified severity: Secondary | ICD-10-CM

## 2015-02-24 DIAGNOSIS — L97309 Non-pressure chronic ulcer of unspecified ankle with unspecified severity: Secondary | ICD-10-CM

## 2015-02-24 DIAGNOSIS — I825Y3 Chronic embolism and thrombosis of unspecified deep veins of proximal lower extremity, bilateral: Secondary | ICD-10-CM

## 2015-02-24 DIAGNOSIS — L97312 Non-pressure chronic ulcer of right ankle with fat layer exposed: Secondary | ICD-10-CM | POA: Diagnosis not present

## 2015-02-24 NOTE — Progress Notes (Signed)
seeiheal

## 2015-02-25 NOTE — Progress Notes (Signed)
PANCHO, RUSHING ([id number]) Visit Report for 02/24/2015 Arrival Information Details Patient Name: [name], [name] Date of Service: 02/24/2015 8:00 AM Medical Record Number: [id number] Patient Account Number: [id number] Date of Birth/Sex: 09/11/1950 (64 y.o. Male) Treating RN: Cornell Barman Primary Care Physician: PATIENT, NO Other Clinician: Referring Physician: Royetta Crochet Treating Physician/Extender: Benjaman Pott in Treatment: 4 Visit Information History Since Last Visit Added or deleted any medications: No Patient Arrived: Cane Any new allergies or adverse reactions: No Arrival Time: 08:16 Had a fall or experienced change in No Accompanied By: self activities of daily living that may affect Transfer Assistance: None risk of falls: Patient Identification Verified: Yes Signs or symptoms of abuse/neglect since last No Secondary Verification Process Yes visito Completed: Hospitalized since last visit: No Patient Has Alerts: Yes Has Dressing in Place as Prescribed: Yes Patient Alerts: Patient on Blood Pain Present Now: No Thinner warfarin ABI L 1.22 R 1.15 Electronic Signature(s) Signed: 02/24/2015 9:25:31 AM By: Gretta Cool, RN, [name], [name], [name] Entered By: Gretta Cool, RN, [name], Kim on 02/24/2015 24:26:83 [name] ([id number]) -------------------------------------------------------------------------------- Encounter Discharge Information Details Patient Name: [name] Date of Service: 02/24/2015 8:00 AM Medical Record Number: [id number] Patient Account Number: [id number] Date of Birth/Sex: 13-Sep-1950 (64 y.o. Male) Treating RN: Cornell Barman Primary Care Physician: PATIENT, NO Other Clinician: Referring Physician: Royetta Crochet Treating Physician/Extender: Benjaman Pott in Treatment: 4 Encounter Discharge Information Items Discharge Pain Level: 0 Discharge Condition: Stable Ambulatory Status: Cane Discharge Destination:  Home Transportation: Private Auto Accompanied By: self Schedule Follow-up Appointment: Yes Medication Reconciliation completed and provided to Patient/Care Yes Oluwatomisin Deman: Provided on Clinical Summary of Care: 02/24/2015 Form Type Recipient Paper Patient WD Electronic Signature(s) Signed: 02/24/2015 5:36:04 PM By: Judene Companion MD Previous Signature: 02/24/2015 8:43:20 AM Version By: Ruthine Dose Entered By: Judene Companion on 02/24/2015 09:39:02 [place], [name] ([id number]) -------------------------------------------------------------------------------- Lower Extremity Assessment Details Patient Name: [name] Date of Service: 02/24/2015 8:00 AM Medical Record Number: [id number] Patient Account Number: [id number] Date of Birth/Sex: Jun 19, 1950 (64 y.o. Male) Treating RN: Cornell Barman Primary Care Physician: PATIENT, NO Other Clinician: Referring Physician: Royetta Crochet Treating Physician/Extender: Benjaman Pott in Treatment: 4 Edema Assessment Assessed: [Left: No] [Right: No] E[Left: dema] [Right: :] Calf Left: Right: Point of Measurement: 39 cm From Medial Instep cm 43.9 cm Ankle Left: Right: Point of Measurement: 12 cm From Medial Instep cm 30.2 cm Vascular Assessment Pulses: Posterior Tibial Dorsalis Pedis Palpable: [Right:Yes] Extremity colors, hair growth, and conditions: Extremity Color: [Right:Hyperpigmented] Hair Growth on Extremity: [Right:Yes] Temperature of Extremity: [Right:Warm] Capillary Refill: [Right:< 3 seconds] Toe Nail Assessment Left: Right: Thick: Yes Discolored: Yes Deformed: No Improper Length and Hygiene: No Electronic Signature(s) Signed: 02/24/2015 9:25:31 AM By: Gretta Cool, RN, [name], [name], [name] Entered By: Gretta Cool, RN, [name], Kim on 02/24/2015 08:27:29 [name], [name] ([id number]) -------------------------------------------------------------------------------- Multi Wound Chart Details Patient Name: [name] Date of Service: 02/24/2015 8:00 AM Medical Record Number: [id number] Patient Account Number: [id number] Date of Birth/Sex: [birth year] (64 y.o. Male) Treating RN: Cornell Barman Primary Care Physician: PATIENT, NO Other Clinician: Referring Physician: Royetta Crochet Treating Physician/Extender: Benjaman Pott in Treatment: 4 Vital Signs Height(in): 74 Pulse(bpm): 54 Weight(lbs): 238 Blood Pressure 139/93 (mmHg): Body Mass Index(BMI): 31 Temperature(F): 97.7 Respiratory Rate 20 (breaths/min): Photos: [1:No Photos] [N/A:N/A] Wound Location: [1:Right Lower Leg - Medial, N/A Distal] Wounding Event: [1:Gradually Appeared] [N/A:N/A] Primary Etiology: [1:Venous Leg Ulcer] [N/A:N/A] Comorbid History: [1:Arrhythmia, Hypertension, N/A Gout, Neuropathy] Date Acquired: [1:01/06/2015] [N/A:N/A] Weeks of Treatment: [1:4] [N/A:N/A] Wound Status: [1:Open] [N/A:N/A] Measurements  L x W x D 4.5x2x0.3 [N/A:N/A] (cm) Area (cm) : [1:7.069] [N/A:N/A] Volume (cm) : [1:2.121] [N/A:N/A] % Reduction in Area: [1:14.90%] [N/A:N/A] % Reduction in Volume: 36.20% [N/A:N/A] Classification: [1:Full Thickness Without Exposed Support Structures] [N/A:N/A] Exudate Amount: [1:Medium] [N/A:N/A] Exudate Type: [1:Serous] [N/A:N/A] Exudate Color: [1:amber] [N/A:N/A] Wound Margin: [1:Flat and Intact] [N/A:N/A] Granulation Amount: [1:Large (67-100%)] [N/A:N/A] Granulation Quality: [1:Red, Pink] [N/A:N/A] Necrotic Amount: [1:Small (1-33%)] [N/A:N/A] Exposed Structures: [1:Fascia: No Fat: No Tendon: No Muscle: No] [N/A:N/A] Joint: No Bone: No Limited to Skin Breakdown Epithelialization: Small (1-33%) N/A N/A Debridement: Debridement (19147- N/A N/A 11047) Time-Out Taken: Yes N/A N/A Pain Control: Other N/A N/A Tissue Debrided: Fibrin/Slough, Exudates, N/A N/A Subcutaneous Level: Skin/Subcutaneous N/A N/A Tissue Debridement Area (sq 9 N/A N/A cm): Instrument: Curette N/A N/A Bleeding:  Minimum N/A N/A Hemostasis Achieved: Pressure N/A N/A Procedural Pain: 1 N/A N/A Post Procedural Pain: 1 N/A N/A Debridement Treatment Procedure was tolerated N/A N/A Response: well Post Debridement 4.5x2x0.3 N/A N/A Measurements L x W x D (cm) Post Debridement 2.121 N/A N/A Volume: (cm) Periwound Skin Texture: Edema: No N/A N/A Excoriation: No Induration: No Callus: No Crepitus: No Fluctuance: No Friable: No Rash: No Scarring: No Periwound Skin Maceration: No N/A N/A Moisture: Moist: No Dry/Scaly: No Periwound Skin Color: Atrophie Blanche: No N/A N/A Cyanosis: No Ecchymosis: No Erythema: No Hemosiderin Staining: No Mottled: No Pallor: No Rubor: No Temperature: No Abnormality N/A N/A Tenderness on Yes N/A N/A Palpation: [name], [name] ([id number]) Wound Preparation: Ulcer Cleansing: N/A N/A Rinsed/Irrigated with Saline Topical Anesthetic Applied: Other: lidocaine 4% Procedures Performed: Debridement N/A N/A Treatment Notes Electronic Signature(s) Signed: 02/24/2015 9:25:31 AM By: Gretta Cool, RN, [name], [name], [name] Entered By: Gretta Cool, RN, [name], Kim on 02/24/2015 08:34:58 [name], [name] ([id number]) -------------------------------------------------------------------------------- Multi-Disciplinary Care Plan Details Patient Name: [name] Date of Service: 02/24/2015 8:00 AM Medical Record Number: [id number] Patient Account Number: [id number] Date of Birth/Sex: 1951-02-04 (64 y.o. Male) Treating RN: Cornell Barman Primary Care Physician: PATIENT, NO Other Clinician: Referring Physician: Royetta Crochet Treating Physician/Extender: Benjaman Pott in Treatment: 4 Active Inactive Abuse / Safety / Falls / Self Care Management Nursing Diagnoses: Impaired physical mobility Potential for falls Goals: Patient will remain injury free Date Initiated: 01/27/2015 Goal Status: Active Interventions: Assess fall risk on admission and as  needed Notes: Orientation to the Wound Care Program Nursing Diagnoses: Knowledge deficit related to the wound healing center program Goals: Patient/caregiver will verbalize understanding of the [facility] [facility] Date Initiated: 01/27/2015 Goal Status: Active Interventions: Provide education on orientation to the wound center Notes: Venous Leg Ulcer Nursing Diagnoses: Potential for venous Insuffiency (use before diagnosis confirmed) Goals: Non-invasive venous studies are completed as ordered [name], [name] ([id number]) Date Initiated: 01/27/2015 Goal Status: Active Patient will maintain optimal edema control Date Initiated: 01/27/2015 Goal Status: Active Interventions: Assess peripheral edema status every visit. Compression as ordered Notes: Wound/Skin Impairment Nursing Diagnoses: Impaired tissue integrity Goals: Ulcer/skin breakdown will have a volume reduction of 30% by week 4 Date Initiated: 01/27/2015 Goal Status: Active Ulcer/skin breakdown will have a volume reduction of 50% by week 8 Date Initiated: 01/27/2015 Goal Status: Active Ulcer/skin breakdown will have a volume reduction of 80% by week 12 Date Initiated: 01/27/2015 Goal Status: Active Ulcer/skin breakdown will heal within 14 weeks Date Initiated: 01/27/2015 Goal Status: Active Interventions: Assess ulceration(s) every visit Notes: Electronic Signature(s) Signed: 02/24/2015 9:25:31 AM By: Gretta Cool, RN, [name], [name], [name] Entered By: Gretta Cool, RN, [name], Kim on 02/24/2015 08:34:50 [name], [name] ([id number]) --------------------------------------------------------------------------------  Pain Assessment Details Patient Name: [name], [name] Date of Service: 02/24/2015 8:00 AM Medical Record Number: [id number] Patient Account Number: [id number] Date of Birth/Sex: 06-04-1950 (64 y.o. Male) Treating RN: Cornell Barman Primary Care Physician: PATIENT, NO Other Clinician: Referring Physician: Royetta Crochet Treating Physician/Extender: Benjaman Pott in Treatment: 4 Active Problems Location of Pain Severity and Description of Pain Patient Has Paino No Site Locations Pain Management and Medication Current Pain Management: Electronic Signature(s) Signed: 02/24/2015 9:25:31 AM By: Gretta Cool, RN, [name], [name], [name] Entered By: Gretta Cool, RN, [name], Kim on 02/24/2015 08:16:35 [name], [name] ([id number]) -------------------------------------------------------------------------------- Patient/Caregiver Education Details Patient Name: [name] Date of Service: 02/24/2015 8:00 AM Medical Record Number: [id number] Patient Account Number: [id number] Date of Birth/Gender: 05/12/1951 (64 y.o. Male) Treating RN: Cornell Barman Primary Care Physician: PATIENT, NO Other Clinician: Referring Physician: Royetta Crochet Treating Physician/Extender: Benjaman Pott in Treatment: 4 Education Assessment Education Provided To: Patient Education Topics Provided Wound/Skin Impairment: Handouts: Caring for Your Ulcer, Other: continue wound care as prescribed Methods: Demonstration, Explain/Verbal Responses: State content correctly Electronic Signature(s) Signed: 02/24/2015 5:36:04 PM By: Judene Companion MD Previous Signature: 02/24/2015 9:25:31 AM Version By: Gretta Cool, RN, [name], [name], [name] Entered By: Judene Companion on 02/24/2015 09:39:12 [name], [name] ([id number]) -------------------------------------------------------------------------------- Wound Assessment Details Patient Name: [name] Date of Service: 02/24/2015 8:00 AM Medical Record Number: [id number] Patient Account Number: [id number] Date of Birth/Sex: 09-Feb-1951 (64 y.o. Male) Treating RN: Cornell Barman Primary Care Physician: PATIENT, NO Other Clinician: Referring Physician: Royetta Crochet Treating Physician/Extender: Benjaman Pott in Treatment: 4 Wound Status Wound Number: 1 Primary Venous Leg  Ulcer Etiology: Wound Location: Right Lower Leg - Medial, Distal Wound Status: Open Wounding Event: Gradually Appeared Comorbid Arrhythmia, Hypertension, Gout, History: Neuropathy Date Acquired: 01/06/2015 Weeks Of Treatment: 4 Clustered Wound: No Photos Photo Uploaded By: Gretta Cool, RN, [name], Kim on 02/24/2015 10:30:34 Wound Measurements Length: (cm) 4.5 % Reduction in Ar Width: (cm) 2 % Reduction in Vo Depth: (cm) 0.3 Epithelialization Area: (cm) 7.069 Volume: (cm) 2.121 ea: 14.9% lume: 36.2% : Small (1-33%) Wound Description Full Thickness Without Exposed Foul Odor After Classification: Support Structures Wound Margin: Flat and Intact Medium [name], [name] ([id number]) Cleansing: No Exudate Amount: Exudate Type: Serous Exudate Color: amber Wound Bed Granulation Amount: Large (67-100%) Exposed Structure Granulation Quality: Red, Pink Fascia Exposed: No Necrotic Amount: Small (1-33%) Fat Layer Exposed: No Necrotic Quality: Adherent Slough Tendon Exposed: No Muscle Exposed: No Joint Exposed: No Bone Exposed: No Limited to Skin Breakdown Periwound Skin Texture Texture Color No Abnormalities Noted: No No Abnormalities Noted: No Callus: No Atrophie Blanche: No Crepitus: No Cyanosis: No Excoriation: No Ecchymosis: No Fluctuance: No Erythema: No Friable: No Hemosiderin Staining: No Induration: No Mottled: No Localized Edema: No Pallor: No Rash: No Rubor: No Scarring: No Temperature / Pain Moisture Temperature: No Abnormality No Abnormalities Noted: No Tenderness on Palpation: Yes Dry / Scaly: No Maceration: No Moist: No Wound Preparation Ulcer Cleansing: Rinsed/Irrigated with Saline Topical Anesthetic Applied: Other: lidocaine 4%, Treatment Notes Wound #1 (Right, Distal, Medial Lower Leg) 1. Cleansed with: Clean wound with Normal Saline 2. Anesthetic Topical Lidocaine 4% cream to wound bed prior to debridement 4. Dressing Applied: Aquacel  Ag 5. Secondary Dressing Applied [name], [name] ([id number]) ABD Pad 7. Secured with Rolena Infante to Right Lower Extremity Electronic Signature(s) Signed: 02/24/2015 9:25:31 AM By: Gretta Cool, RN, [name], [name], [name] Entered By: Gretta Cool, RN, [name], Kim on 02/24/2015 62:83:15 [name], [name] ([id number]) -------------------------------------------------------------------------------- Vitals Details Patient Name: [name] Date of Service:  02/24/2015 8:00 AM Medical Record Number: [id number] Patient Account Number: [id number] Date of Birth/Sex: 1950/09/10 (64 y.o. Male) Treating RN: Cornell Barman Primary Care Physician: PATIENT, NO Other Clinician: Referring Physician: Royetta Crochet Treating Physician/Extender: Benjaman Pott in Treatment: 4 Vital Signs Time Taken: 08:16 Temperature (F): 97.7 Height (in): 74 Pulse (bpm): 54 Weight (lbs): 238 Respiratory Rate (breaths/min): 20 Body Mass Index (BMI): 30.6 Blood Pressure (mmHg): 139/93 Reference Range: 80 - 120 mg / dl Electronic Signature(s) Signed: 02/24/2015 9:25:31 AM By: Gretta Cool, RN, [name], [name], [name] Entered By: Gretta Cool, RN, [name], Kim on 02/24/2015 08:17:05

## 2015-02-25 NOTE — Progress Notes (Addendum)
[name], [name] ([id number]) Visit Report for 02/24/2015 Chief Complaint Document Details Auble, 02/24/2015 8:00 Patient Name: Date of Service: [name] AM Medical Record Patient Account Number: [id number] Number: Treating RN: [name] Date of Birth/Sex: Aug 12, 1950 (64 y.o. Male) Other Clinician: Primary Care Physician: PATIENT, NO Treating Jerline Pain, Mehr Depaoli Referring Physician: Royetta Crochet Physician/Extender: Weeks in Treatment: 4 Information Obtained from: Patient Chief Complaint Patient presents for treatment of an open ulcer due to venous insufficiency. He says he noticed a "into the right medial ankle for about 3-4 weeks Electronic Signature(s) Signed: 02/24/2015 5:36:04 PM By: Judene Companion MD Entered By: Judene Companion on 02/24/2015 09:34:41 Ouch, [name] ([id number]) -------------------------------------------------------------------------------- Debridement Details Aslin, 02/24/2015 8:00 Patient Name: Date of Service: [facility] AM Medical Record Patient Account Number: [id number] Number: Treating RN: [name] Date of Birth/Sex: [birth year] (64 y.o. Male) Other Clinician: Primary Care Physician: PATIENT, NO Treating Jaquann Guarisco, Cro[place]on Referring Physician: Royetta Crochet Physician/Extender: Weeks in Treatment: 4 Debridement Performed for Wound #1 Right,Distal,Medial Lower Leg Assessment: Performed By: Physician Judene Companion, MD Debridement: Debridement Pre-procedure Yes Verification/Time Out Taken: Start Time: 08:30 Pain Control: Other : lidocaine 4% Level: Skin/Subcutaneous Tissue Total Area Debrided (L x 4.5 (cm) x 2 (cm) = 9 (cm) W): Tissue and other Viable, Non-Viable, Exudate, Fibrin/Slough, Subcutaneous material debrided: Instrument: Curette Bleeding: Minimum Hemostasis Achieved: Pressure End Time: 08:31 Procedural Pain: 1 Post Procedural Pain: 1 Response to Treatment: Procedure was tolerated well Post  Debridement Measurements of Total Wound Length: (cm) 4.5 Width: (cm) 2 Depth: (cm) 0.3 Volume: (cm) 2.121 Post Procedure Diagnosis Same as Pre-procedure Electronic Signature(s) Signed: 02/24/2015 9:25:31 AM By: Gretta Cool, RN, [name], [name], [name] Entered By: Gretta Cool, RN, [name], Kim on 02/24/2015 08:32:10 Fazzini, [name] ([id number]) -------------------------------------------------------------------------------- HPI Details Pursley, 02/24/2015 8:00 Patient Name: Date of Service: [facility] AM Medical Record Patient Account Number: [id number] Number: Treating RN: [name] Date of Birth/Sex: [birth year] (64 y.o. Male) Other Clinician: Primary Care Physician: PATIENT, NO Treating Harrison Zetina Referring Physician: Royetta Crochet Physician/Extender: Weeks in Treatment: 4 History of Present Illness Location: open wound just above his right ankle Quality: Patient reports experiencing a dull pain to affected area(s). Severity: Patient states wound are getting worse. Duration: Patient has had the wound for < 4 weeks prior to presenting for treatment Timing: Pain in wound is Intermittent (comes and goes Context: The wound appeared gradually over time Modifying Factors: Consults to this date include: he was seen in the ER and was referred to a vascular surgeon but the patient has not done that. He may have been treated with clindamycin in the ER. Associated Signs and Symptoms: Patient reports having difficulty standing for long periods. HPI Description: 64 year old gentleman who was seen in the emergency department recently on 01/06/2015 for a wound of his right lower extremity which he says was not involving any injury and he did not know how he sustained it. He had draining foul-smelling liquid from the area and had gone for care there. his past medical history is significant for DVT, hypertension, gout, tobacco abuse, cocaine abuse, stroke, atrial fibrillation, pulmonary  embolism. he has also had some vascular surgery with a stent placed in his leg. He has been a smoker for many years and has given up straight drugs several years ago. He continues to smoke about 4-5 cigarettes a day. 02/03/2015 -- received a note from 05/14/2013 where Dr. Leotis Pain placed an inferior vena cava filter. The patient had a deep vein thrombosis while therapeutic on anticoagulation for previous DVT and a  IVC filter was placed for this. 02/10/2015 -- he did have his vascular test done on Friday but we have no reports yet. 02/17/2015 -- notes were reviewed from the vascular office and the patient had a venous ultrasound done which revealed that he had no reflux in the greater saphenous vein or the short saphenous vein bilaterally. He did have subacute DVT in the common femoral vein and popliteal veins on the right and left side. The recommendation was to continue with Unna's boot therapy at the wound clinic and then to wear graduated compression stockings once the ulcers healed and later if he had continuous problems lymphedema pump would benefit him. Electronic Signature(s) Signed: 02/24/2015 5:36:04 PM By: Judene Companion MD Entered By: Judene Companion on 02/24/2015 09:34:53 Safranek, [name] ([id number]) -------------------------------------------------------------------------------- Physical Exam Details Venable, 02/24/2015 8:00 Patient Name: Date of Service: [facility] AM Medical Record Patient Account Number: [id number] Number: Treating RN: [name] Date of Birth/Sex: 11-29-1950 (64 y.o. Male) Other Clinician: Primary Care Physician: PATIENT, NO Treating Jerline Pain, Brelyn Woehl Referring Physician: Royetta Crochet Physician/Extender: Weeks in Treatment: 4 Notes venous ulcer right leg. Debrided into subq and Product manager) Signed: 02/24/2015 5:36:04 PM By: Judene Companion MD Entered By: Judene Companion on 02/24/2015 09:36:23 Arnell, [name]  ([id number]) -------------------------------------------------------------------------------- Physician Orders Details Riggle, 02/24/2015 8:00 Patient Name: Date of Service: [facility] AM Medical Record Patient Account Number: [id number] Number: Treating RN: [name] Date of Birth/Sex: Aug 10, 1950 (64 y.o. 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 Anesthetic Wound #1 Right,Distal,Medial Lower Leg o Topical Lidocaine 4% cream applied to wound bed prior to debridement Primary Wound Dressing Wound #1 Right,Distal,Medial Lower Leg o Aquacel Ag Secondary Dressing Wound #1 Right,Distal,Medial Lower Leg o ABD pad Dressing Change Frequency Wound #1 Right,Distal,Medial Lower Leg o Change dressing every week Follow-up Appointments Wound #1 Right,Distal,Medial Lower Leg o Return Appointment in 1 week. Edema Control Worster, Mateo ([id number]) Wound #1 Right,Distal,Medial Lower Leg o Unna Boot to Right Lower Extremity - white paste o Elevate legs to the level of the heart and pump ankles as often as possible Electronic Signature(s) Signed: 02/25/2015 9:44:38 AM By: Judene Companion MD Previous Signature: 02/24/2015 9:25:31 AM Version By: Gretta Cool RN, [name], [name], [name] Entered By: Judene Companion on 02/25/2015  09:44:37 Kane, [name] ([id number]) -------------------------------------------------------------------------------- Problem List Details Jimmerson, 02/24/2015 8:00 Patient Name: Date of Service: [facility] AM Medical Record Patient Account Number: [id number] Number: Treating RN: [name] Date of Birth/Sex: [birth year] (64 y.o. 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 01/27/2015 Yes I83.213 Varicose veins of right lower extremity with both ulcer of 01/27/2015 Yes ankle and inflammation L97.312 Non-pressure chronic ulcer of right ankle with fat layer 01/27/2015 Yes exposed F17.209 Nicotine dependence, unspecified, with unspecified 01/27/2015 Yes nicotine-induced disorders Inactive Problems Resolved Problems Electronic Signature(s) Signed:  02/24/2015 5:36:04 PM By: Judene Companion MD Entered By: Judene Companion on 02/24/2015 09:34:16 Ellena, [name] ([id number]) -------------------------------------------------------------------------------- Progress Note Details Tennyson, 02/24/2015 8:00 Patient Name: Date of Service: [facility] AM Medical Record Patient Account Number: [id number] Number: Treating RN: [name] Date of Birth/Sex: [birth year] (64 y.o. Male) Other Clinician: Primary Care Physician: PATIENT, NO Treating Christianna Belmonte Referring Physician: Royetta Crochet Physician/Extender: Weeks in Treatment: 4 Subjective Chief Complaint Information obtained from Patient Patient presents for treatment of an open ulcer due to venous insufficiency. He says he noticed a "into the right medial ankle for about 3-4 weeks History of Present Illness (HPI) The following HPI elements were documented for  the patient's wound: Location: open wound just above his right ankle Quality: Patient reports experiencing a dull pain to affected area(s). Severity: Patient states wound are getting worse. Duration: Patient has had the wound for < 4 weeks prior to presenting for treatment Timing: Pain in wound is Intermittent (comes and goes Context: The wound appeared gradually over time Modifying Factors: Consults to this date include: he was seen in the ER and was referred to a vascular surgeon but the patient has not done that. He may have been treated with clindamycin in the ER. Associated Signs and Symptoms: Patient reports having difficulty standing for long periods. 64 year old gentleman who was seen in the emergency department recently on 01/06/2015 for a wound of his right lower extremity which he says was not involving any injury and he did not know how he sustained it. He had draining foul-smelling liquid from the area and had gone for care there. his past medical history is significant for DVT, hypertension, gout, tobacco abuse, cocaine abuse, stroke, atrial fibrillation, pulmonary embolism. he has also had some vascular surgery with a stent placed in his leg. He has been a smoker for many years and has given up straight drugs several years ago. He continues to smoke about 4-5 cigarettes a day. 02/03/2015 -- received a note from 05/14/2013 where Dr. Leotis Pain placed an inferior vena cava filter. The patient had a deep vein thrombosis while therapeutic on anticoagulation for previous DVT and a IVC filter was placed for this. 02/10/2015 -- he did have his vascular test done on Friday but we have no reports yet. 02/17/2015 -- notes were reviewed from the vascular office and the patient had a venous ultrasound done which revealed that he had no reflux in the greater saphenous vein or the short saphenous vein bilaterally. He did have subacute DVT in the common femoral vein and popliteal veins on the  right and left side. The recommendation was to continue with Unna's boot therapy at the wound clinic and then to wear graduated compression stockings once the ulcers healed and later if he had continuous problems lymphedema pump would benefit him. Span, [name] ([id number]) Objective Constitutional Vitals Time Taken: 8:16 AM, Height: 74 in, Weight: 238 lbs, BMI: 30.6, Temperature: 97.7 F, Pulse: 54 bpm, Respiratory Rate: 20 breaths/min, Blood Pressure: 139/93 mmHg. Integumentary (Hair, Skin) Wound #1 status is Open. Original cause of wound was Gradually Appeared. The wound is located on the Right,Distal,Medial Lower Leg. The wound measures 4.5cm length x 2cm width x 0.3cm depth; 7.069cm^2 area and 2.121cm^3 volume. The wound is limited to skin breakdown. There is a medium amount of serous drainage noted. The wound margin is flat and intact. There is large (67-100%) red, pink granulation within the wound bed. There is a small (1-33%) amount of necrotic tissue within the wound bed including  Adherent Slough. The periwound skin appearance did not exhibit: Callus, Crepitus, Excoriation, Fluctuance, Friable, Induration, Localized Edema, Rash, Scarring, Dry/Scaly, Maceration, Moist, Atrophie Blanche, Cyanosis, Ecchymosis, Hemosiderin Staining, Mottled, Pallor, Rubor, Erythema. Periwound temperature was noted as No Abnormality. The periwound has tenderness on palpation. 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 There was a Skin/Subcutaneous Tissue Debridement ([id number])  debridement with total area of 9 sq cm performed by Judene Companion, MD. with the following instrument(s): Curette to remove Viable and Non-Viable tissue/material including Exudate, Fibrin/Slough, and Subcutaneous after achieving pain control using Other Houchen, Alyas ([id number]) (lidocaine 4%). A time out was conducted prior to the start of the procedure. A Minimum amount of bleeding was controlled with Pressure. The procedure was tolerated well with a pain level of 1 throughout and a pain level of 1 following the procedure. Post Debridement Measurements: 4.5cm length x 2cm width x 0.3cm depth; 2.121cm^3 volume. Post procedure Diagnosis Wound #1: Same as Pre-Procedure Plan Wound Cleansing: Wound #1 Right,Distal,Medial Lower Leg: Clean wound with Normal Saline. Anesthetic: Wound #1 Right,Distal,Medial Lower Leg: Topical Lidocaine 4% cream applied to wound bed prior to debridement Primary Wound Dressing: Wound #1 Right,Distal,Medial Lower Leg: Aquacel Ag Secondary Dressing: Wound #1 Right,Distal,Medial Lower Leg: ABD pad Dressing Change Frequency: Wound #1 Right,Distal,Medial Lower Leg: Change dressing every week Follow-up Appointments: Wound #1 Right,Distal,Medial Lower Leg: Return Appointment in 1 week. Edema Control: Wound #1 Right,Distal,Medial Lower Leg: Unna Boot to Right Lower Extremity - white paste Elevate legs to the level of the heart and pump ankles as often as possible Follow-Up Appointments: A follow-up appointment should be scheduled. Medication Reconciliation completed and provided to Patient/Care Provider. A Patient Clinical Summary of Care was provided to WD Debrided subq and used silver collagen dressing and unna boot right leg HARGIS, VANDYNE ([id number]) Electronic Signature(s) Signed: 02/24/2015 5:36:04 PM By: Judene Companion MD Entered By: Judene Companion on 02/24/2015 09:37:52 Sieloff, [name]  ([id number]) -------------------------------------------------------------------------------- SuperBill Details Patient Name: [name] Date of Service: 02/24/2015 Medical Record Number: [id number] Patient Account Number: [id number] Date of Birth/Sex: [birth year] (64 y.o. 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 F17.209 Nicotine dependence, unspecified, with unspecified nicotine-induced disorders Facility Procedures CPT4: Description Modifier Quantity Code [id number] 11042 - DEB SUBQ TISSUE 20 SQ CM/< 1 ICD-10 Description Diagnosis I83.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 

## 2015-03-03 ENCOUNTER — Encounter: Payer: Medicare Other | Admitting: Surgery

## 2015-03-03 DIAGNOSIS — L97312 Non-pressure chronic ulcer of right ankle with fat layer exposed: Secondary | ICD-10-CM | POA: Diagnosis not present

## 2015-03-04 NOTE — Progress Notes (Signed)
[name], [name] ([id number]) Visit Report for 03/03/2015 Arrival Information Details Patient Name: [name], [name] Date of Service: 03/03/2015 8:00 AM Medical Record Number: [id number] Patient Account Number: [id number] Date of Birth/Sex: [birth year] (64 y.o. Male) Treating RN: Cornell Barman Primary Care Physician: PATIENT, NO Other Clinician: Referring Physician: Royetta Crochet Treating Physician/Extender: Frann Rider in Treatment: 5 Visit Information History Since Last Visit Added or deleted any medications: No Patient Arrived: Ambulatory Any new allergies or adverse reactions: No Arrival Time: 08:10 Had a fall or experienced change in No Accompanied By: self activities of daily living that may affect Transfer Assistance: None risk of falls: Patient Identification Verified: Yes Signs or symptoms of abuse/neglect since last No Secondary Verification Process Yes visito Completed: Hospitalized since last visit: No Patient Has Alerts: Yes Has Dressing in Place as Prescribed: Yes Patient Alerts: Patient on Blood Has Compression in Place as Prescribed: Yes Thinner Pain Present Now: No warfarin ABI L 1.22 R 1.15 Electronic Signature(s) Signed: 03/03/2015 6:09:37 PM By: Gretta Cool, RN, [name], [name], [name] Entered By: Gretta Cool, RN, [name], Kim on 03/03/2015 08:10:59 [name], [name] ([id number]) -------------------------------------------------------------------------------- Encounter Discharge Information Details Patient Name: [name] Date of Service: 03/03/2015 8:00 AM Medical Record Number: [id number] Patient Account Number: [id number] Date of Birth/Sex: 03-30-1951 (64 y.o. Male) Treating RN: Cornell Barman Primary Care Physician: PATIENT, NO Other Clinician: Referring Physician: Royetta Crochet Treating Physician/Extender: Frann Rider in Treatment: 5 Encounter Discharge Information Items Discharge Pain Level: 0 Discharge Condition: Stable Ambulatory  Status: Ambulatory Discharge Destination: Home Transportation: Private Auto Accompanied By: self Schedule Follow-up Appointment: Yes Medication Reconciliation completed and provided to Patient/Care Yes Odean Mcelwain: Provided on Clinical Summary of Care: 03/03/2015 Form Type Recipient Paper Patient DW Electronic Signature(s) Signed: 03/03/2015 6:09:37 PM By: Gretta Cool, RN, [name], [name], [name] Previous Signature: 03/03/2015 8:39:30 AM Version By: Ruthine Dose Entered By: Gretta Cool RN, [name], Kim on 03/03/2015 08:39:56 [name], [name] ([id number]) -------------------------------------------------------------------------------- Lower Extremity Assessment Details Patient Name: [name] Date of Service: 03/03/2015 8:00 AM Medical Record Number: [id number] Patient Account Number: [id number] Date of Birth/Sex: August 24, 1950 (64 y.o. Male) Treating RN: Cornell Barman Primary Care Physician: PATIENT, NO Other Clinician: Referring Physician: Royetta Crochet Treating Physician/Extender: Frann Rider in Treatment: 5 Edema Assessment Assessed: [Left: No] [Right: No] E[Left: dema] [Right: :] Calf Left: Right: Point of Measurement: 39 cm From Medial Instep cm 43.4 cm Ankle Left: Right: Point of Measurement: 12 cm From Medial Instep cm 30.6 cm Vascular Assessment Pulses: Posterior Tibial Dorsalis Pedis Palpable: [Right:Yes] Extremity colors, hair growth, and conditions: Extremity Color: [Right:Hyperpigmented] Hair Growth on Extremity: [Right:Yes] Temperature of Extremity: [Right:Warm] Capillary Refill: [Right:< 3 seconds] Toe Nail Assessment Left: Right: Thick: Yes Discolored: Yes Deformed: No Improper Length and Hygiene: No Electronic Signature(s) Signed: 03/03/2015 6:09:37 PM By: Gretta Cool, RN, [name], [name], [name] Entered By: Gretta Cool, RN, [name], Kim on 03/03/2015 08:17:26 [name], [name]  ([id number]) -------------------------------------------------------------------------------- Multi Wound Chart Details Patient Name: [name] Date of Service: 03/03/2015 8:00 AM Medical Record Number: [id number] Patient Account Number: [id number] Date of Birth/Sex: [birth year] (64 y.o. Male) Treating RN: Cornell Barman Primary Care Physician: PATIENT, NO Other Clinician: Referring Physician: Royetta Crochet Treating Physician/Extender: Frann Rider in Treatment: 5 Vital Signs Height(in): 74 Pulse(bpm): 62 Weight(lbs): 238 Blood Pressure 147/91 (mmHg): Body Mass Index(BMI): 31 Temperature(F): 98.0 Respiratory Rate 20 (breaths/min): Photos: [1:No Photos] [N/A:N/A] Wound Location: [1:Right Lower Leg - Medial, N/A Distal] Wounding Event: [1:Gradually Appeared] [N/A:N/A] Primary Etiology: [1:Venous Leg Ulcer] [N/A:N/A] Comorbid History: [1:Arrhythmia, Hypertension, N/A Gout, Neuropathy] Date Acquired: [  1:01/06/2015] [N/A:N/A] Weeks of Treatment: [1:5] [N/A:N/A] Wound Status: [1:Open] [N/A:N/A] Measurements L x W x D 4.2x1.7x0.3 [N/A:N/A] (cm) Area (cm) : [1:5.608] [N/A:N/A] Volume (cm) : [1:1.682] [N/A:N/A] % Reduction in Area: [1:32.50%] [N/A:N/A] % Reduction in Volume: 49.40% [N/A:N/A] Classification: [1:Full Thickness Without Exposed Support Structures] [N/A:N/A] Exudate Amount: [1:Medium] [N/A:N/A] Exudate Type: [1:Serosanguineous] [N/A:N/A] Exudate Color: [1:red, brown] [N/A:N/A] Wound Margin: [1:Flat and Intact] [N/A:N/A] Granulation Amount: [1:Large (67-100%)] [N/A:N/A] Granulation Quality: [1:Red, Pink] [N/A:N/A] Necrotic Amount: [1:Small (1-33%)] [N/A:N/A] Exposed Structures: [1:Fascia: No Fat: No Tendon: No Muscle: No] [N/A:N/A] Joint: No Bone: No Limited to Skin Breakdown Epithelialization: Small (1-33%) N/A N/A Periwound Skin Texture: Edema: No N/A N/A Excoriation: No Induration: No Callus: No Crepitus: No Fluctuance: No Friable:  No Rash: No Scarring: No Periwound Skin Moist: Yes N/A N/A Moisture: Maceration: No Dry/Scaly: No Periwound Skin Color: Atrophie Blanche: No N/A N/A Cyanosis: No Ecchymosis: No Erythema: No Hemosiderin Staining: No Mottled: No Pallor: No Rubor: No Temperature: No Abnormality N/A N/A Tenderness on Yes N/A N/A Palpation: Wound Preparation: Ulcer Cleansing: N/A N/A Rinsed/Irrigated with Saline Topical Anesthetic Applied: Other: lidocaine 4% Treatment Notes Electronic Signature(s) Signed: 03/03/2015 6:09:37 PM By: Gretta Cool, RN, [name], [name], [name] Entered By: Gretta Cool, RN, [name], Kim on 03/03/2015 08:23:10 [name], [name] ([id number]) -------------------------------------------------------------------------------- Multi-Disciplinary Care Plan Details Patient Name: [name] Date of Service: 03/03/2015 8:00 AM Medical Record Number: [id number] Patient Account Number: [id number] Date of Birth/Sex: [birth year] (65 y.o. Male) Treating RN: Cornell Barman Primary Care Physician: PATIENT, NO Other Clinician: Referring Physician: Royetta Crochet Treating Physician/Extender: Frann Rider in Treatment: 5 Active Inactive Abuse / Safety / Falls / Self Care Management Nursing Diagnoses: Impaired physical mobility Potential for falls Goals: Patient will remain injury free Date Initiated: 01/27/2015 Goal Status: Active Interventions: Assess fall risk on admission and as needed Notes: Orientation to the Wound Care Program Nursing Diagnoses: Knowledge deficit related to the wound healing center program Goals: Patient/caregiver will verbalize understanding of the [name] Program Date Initiated: 01/27/2015 Goal Status: Active Interventions: Provide education on orientation to the wound center Notes: Venous Leg Ulcer Nursing Diagnoses: Potential for venous Insuffiency (use before diagnosis confirmed) Goals: Non-invasive venous studies are completed as  ordered ZESHAN, SENA ([id number]) Date Initiated: 01/27/2015 Goal Status: Active Patient will maintain optimal edema control Date Initiated: 01/27/2015 Goal Status: Active Interventions: Assess peripheral edema status every visit. Compression as ordered Notes: Wound/Skin Impairment Nursing Diagnoses: Impaired tissue integrity Goals: Ulcer/skin breakdown will have a volume reduction of 30% by week 4 Date Initiated: 01/27/2015 Goal Status: Active Ulcer/skin breakdown will have a volume reduction of 50% by week 8 Date Initiated: 01/27/2015 Goal Status: Active Ulcer/skin breakdown will have a volume reduction of 80% by week 12 Date Initiated: 01/27/2015 Goal Status: Active Ulcer/skin breakdown will heal within 14 weeks Date Initiated: 01/27/2015 Goal Status: Active Interventions: Assess ulceration(s) every visit Notes: Electronic Signature(s) Signed: 03/03/2015 6:09:37 PM By: Gretta Cool, RN, [name], [name], [name] Entered By: Gretta Cool, RN, [name], Kim on 03/03/2015 08:23:04 [name], [name] ([id number]) -------------------------------------------------------------------------------- Pain Assessment Details Patient Name: [name] Date of Service: 03/03/2015 8:00 AM Medical Record Number: [id number] Patient Account Number: [id number] Date of Birth/Sex: 1951-05-03 (64 y.o. Male) Treating RN: Cornell Barman Primary Care Physician: PATIENT, NO Other Clinician: Referring Physician: Royetta Crochet Treating Physician/Extender: Frann Rider in Treatment: 5 Active Problems Location of Pain Severity and Description of Pain Patient Has Paino No Site Locations Pain Management and Medication Current Pain Management: Electronic Signature(s) Signed: 03/03/2015 6:09:37 PM By: Gretta Cool,  RN, [name], Leisure centre manager, [name] Entered By: Gretta Cool, RN, [name], Kim on 03/03/2015 08:11:30 [name], [name]  ([id number]) -------------------------------------------------------------------------------- Patient/Caregiver Education Details Patient Name: [name] Date of Service: 03/03/2015 8:00 AM Medical Record Number: [id number] Patient Account Number: [id number] Date of Birth/Gender: [birth year] (64 y.o. Male) Treating RN: Cornell Barman Primary Care Physician: PATIENT, NO Other Clinician: Referring Physician: Royetta Crochet Treating Physician/Extender: Frann Rider in Treatment: 5 Education Assessment Education Provided To: Patient Education Topics Provided Venous: Controlling Swelling with Multilayered Compression Wraps, Other: coninue wound care as Handouts: prescribed Methods: Demonstration, Explain/Verbal Responses: State content correctly Electronic Signature(s) Signed: 03/03/2015 6:09:37 PM By: Gretta Cool, RN, [name], [name], [name] Entered By: Gretta Cool, RN, [name], Kim on 03/03/2015 08:40:27 [name], [name] ([id number]) -------------------------------------------------------------------------------- Wound Assessment Details Patient Name: [name] Date of Service: 03/03/2015 8:00 AM Medical Record Number: [id number] Patient Account Number: [id number] Date of Birth/Sex: Jan 31, 1951 (64 y.o. Male) Treating RN: Cornell Barman Primary Care Physician: PATIENT, NO Other Clinician: Referring Physician: Royetta Crochet Treating Physician/Extender: Frann Rider in Treatment: 5 Wound Status Wound Number: 1 Primary Venous Leg Ulcer Etiology: Wound Location: Right Lower Leg - Medial, Distal Wound Status: Open Wounding Event: Gradually Appeared Comorbid Arrhythmia, Hypertension, Gout, History: Neuropathy Date Acquired: 01/06/2015 Weeks Of Treatment: 5 Clustered Wound: No Wound Measurements Length: (cm) 4.2 Width: (cm) 1.7 Depth: (cm) 0.3 Area: (cm) 5.608 Volume: (cm) 1.682 % Reduction in Area: 32.5% % Reduction in Volume: 49.4% Epithelialization: Small  (1-33%) Wound Description Full Thickness Without Exposed Foul Odor Afte Classification: Support Structures Wound Margin: Flat and Intact Exudate Medium Amount: Exudate Type: Serosanguineous Exudate Color: red, brown r Cleansing: No Wound Bed Granulation Amount: Large (67-100%) Exposed Structure Granulation Quality: Red, Pink Fascia Exposed: No Necrotic Amount: Small (1-33%) Fat Layer Exposed: No Necrotic Quality: Adherent Slough Tendon Exposed: No Muscle Exposed: No Joint Exposed: No Bone Exposed: No Limited to Skin Breakdown Periwound Skin Texture Texture Color No Abnormalities Noted: No No Abnormalities Noted: No Callus: No Atrophie Blanche: No [name], [name] Crepitus: No Cyanosis: No Excoriation: No Ecchymosis: No Fluctuance: No Erythema: No Friable: No Hemosiderin Staining: No Induration: No Mottled: No Localized Edema: No Pallor: No Rash: No Rubor: No Scarring: No Temperature / Pain Moisture Temperature: No Abnormality No Abnormalities Noted: No Tenderness on Palpation: Yes Dry / Scaly: No Maceration: No Moist: Yes Wound Preparation Ulcer Cleansing: Rinsed/Irrigated with Saline Topical Anesthetic Applied: Other: lidocaine 4%, Treatment Notes Wound #1 (Right, Distal, Medial Lower Leg) 1. Cleansed with: Cleanse wound with antibacterial soap and water 2. Anesthetic Topical Lidocaine 4% cream to wound bed prior to debridement 4. Dressing Applied: Other dressing (specify in notes) 5. Secondary Dressing Applied ABD Pad 7. Secured with 4-Layer Compression System - Right Lower Extremity Notes Sorbact applied to wound Electronic Signature(s) Signed: 03/03/2015 6:09:37 PM By: Gretta Cool, RN, [name], [name], [name] Entered By: Gretta Cool, RN, [name], Kim on 03/03/2015 08:22:56 [name], [name] ([id number]) -------------------------------------------------------------------------------- [place] Details Patient Name: [name] Date of  Service: 03/03/2015 8:00 AM Medical Record Number: [id number] Patient Account Number: [id number] Date of Birth/Sex: 11-17-1950 (64 y.o. Male) Treating RN: Cornell Barman Primary Care Physician: PATIENT, NO Other Clinician: Referring Physician: Royetta Crochet Treating Physician/Extender: Frann Rider in Treatment: 5 Vital Signs Time Taken: 08:12 Temperature (F): 98.0 Height (in): 74 Pulse (bpm): 62 Weight (lbs): 238 Respiratory Rate (breaths/min): 20 Body Mass Index (BMI): 30.6 Blood Pressure (mmHg): 147/91 Reference Range: 80 - 120 mg / dl Electronic Signature(s) Signed: 03/03/2015 6:09:37 PM By: Gretta Cool, RN, [name], [name], [name] Entered  ByGretta Cool, RN, [name], Kim on 03/03/2015 08:13:32

## 2015-03-04 NOTE — Progress Notes (Signed)
[name], [name] ([id number]) Visit Report for 03/03/2015 Chief Complaint Document Details Monreal, 03/03/2015 8:00 Patient Name: Date of Service: [facility] AM Medical Record Patient Account Number: [id number] Number: Treating RN: Cornell Barman Date of Birth/Sex: 04-13-1951 (64 y.o. Male) Other Clinician: Primary Care Physician: PATIENT, NO Treating Baylyn Sickles Referring Physician: Royetta Crochet Physician/Extender: Weeks in Treatment: 5 Information Obtained from: Patient Chief Complaint Patient presents for treatment of an open ulcer due to venous insufficiency. He says he noticed a "into the right medial ankle for about 3-4 weeks Electronic Signature(s) Signed: 03/03/2015 8:50:58 AM By: Christin Fudge MD, FACS Entered By: Christin Fudge on 03/03/2015 08:50:58 [name], [name] ([id number]) -------------------------------------------------------------------------------- HPI Details [name], 03/03/2015 8:00 Patient Name: Date of Service: [facility] AM Medical Record Patient Account Number: [id number] Number: Treating RN: Cornell Barman Date of Birth/Sex: [birth year] (64 y.o. Male) Other Clinician: Primary Care Physician: PATIENT, NO Treating Shakeel Disney Referring Physician: Royetta Crochet Physician/Extender: Weeks in Treatment: 5 History of Present Illness Location: open wound just above his right ankle Quality: Patient reports experiencing a dull pain to affected area(s). Severity: Patient states wound are getting worse. Duration: Patient has had the wound for < 4 weeks prior to presenting for treatment Timing: Pain in wound is Intermittent (comes and goes Context: The wound appeared gradually over time Modifying Factors: Consults to this date include: he was seen in the ER and was referred to a vascular surgeon but the patient has not done that. He may have been treated with clindamycin in the ER. Associated Signs and Symptoms: Patient reports having  difficulty standing for long periods. HPI Description: 64 year old gentleman who was seen in the emergency department recently on 01/06/2015 for a wound of his right lower extremity which he says was not involving any injury and he did not know how he sustained it. He had draining foul-smelling liquid from the area and had gone for care there. his past medical history is significant for DVT, hypertension, gout, tobacco abuse, cocaine abuse, stroke, atrial fibrillation, pulmonary embolism. he has also had some vascular surgery with a stent placed in his leg. He has been a smoker for many years and has given up straight drugs several years ago. He continues to smoke about 4-5 cigarettes a day. 02/03/2015 -- received a note from 05/14/2013 where Dr. Leotis Pain placed an inferior vena cava filter. The patient had a deep vein thrombosis while therapeutic on anticoagulation for previous DVT and a IVC filter was placed for this. 02/10/2015 -- he did have his vascular test done on Friday but we have no reports yet. 02/17/2015 -- notes were reviewed from the vascular office and the patient had a venous ultrasound done which revealed that he had no reflux in the greater saphenous vein or the short saphenous vein bilaterally. He did have subacute DVT in the common femoral vein and popliteal veins on the right and left side. The recommendation was to continue with Unna's boot therapy at the wound clinic and then to wear graduated compression stockings once the ulcers healed and later if he had continuous problems lymphedema pump would benefit him. Electronic Signature(s) Signed: 03/03/2015 8:51:03 AM By: Christin Fudge MD, FACS Entered By: Christin Fudge on 03/03/2015 08:51:02 Kong, [name] ([id number]) -------------------------------------------------------------------------------- Physical Exam Details [name], 03/03/2015 8:00 Patient Name: Date of Service: [facility] AM Medical Record Patient  Account Number: [id number] Number: Treating RN: Cornell Barman Date of Birth/Sex: 1950/07/03 (64 y.o. Male) Other Clinician: Primary Care Physician: PATIENT, NO Treating Marcela Alatorre Referring Physician: Royetta Crochet  Physician/Extender: Suella Grove in Treatment: 5 Constitutional . Pulse regular. Respirations normal and unlabored. Afebrile. . Eyes Nonicteric. Reactive to light. Ears, Nose, Mouth, and Throat Lips, teeth, and gums WNL.[name] Moist mucosa without lesions . Neck supple and nontender. No palpable supraclavicular or cervical adenopathy. Normal sized without goiter. Respiratory WNL. No retractions.. Cardiovascular Pedal Pulses WNL. No clubbing, cyanosis or edema. Lymphatic No adneopathy. No adenopathy. No adenopathy. Musculoskeletal Adexa without tenderness or enlargement.. Digits and nails w/o clubbing, cyanosis, infection, petechiae, ischemia, or inflammatory conditions.. Integumentary (Hair, Skin) No suspicious lesions. No crepitus or fluctuance. No peri-wound warmth or erythema. No masses.[name] Psychiatric Judgement and insight Intact.. No evidence of depression, anxiety, or agitation.. Notes the ulceration on the right lower eczema and is looking much better and has minimal debris which was washed out with moist saline and irrigated. Electronic Signature(s) Signed: 03/03/2015 8:51:31 AM By: Christin Fudge MD, FACS Entered By: Christin Fudge on 03/03/2015 08:51:31 [name], [name] ([id number]) -------------------------------------------------------------------------------- Physician Orders Details [name], 03/03/2015 8:00 Patient Name: Date of Service: [facility] AM Medical Record Patient Account Number: [id number] Number: Treating RN: Cornell Barman Date of Birth/Sex: [birth year] (64 y.o. Male) Other Clinician: Primary Care Physician: PATIENT, NO Treating Daschel Roughton Referring Physician: Royetta Crochet Physician/Extender: Suella Grove in Treatment: 5 Verbal /  Phone Orders: Yes Clinician: Cornell Barman Read Back and Verified: Yes Diagnosis Coding Wound Cleansing Wound #1 Right,Distal,Medial Lower Leg o Clean wound with Normal Saline. o Cleanse wound with mild soap and water Anesthetic Wound #1 Right,Distal,Medial Lower Leg o Topical Lidocaine 4% cream applied to wound bed prior to debridement Primary Wound Dressing Wound #1 Right,Distal,Medial Lower Leg o Other: - Sorbact Secondary Dressing Wound #1 Right,Distal,Medial Lower Leg o ABD pad Dressing Change Frequency Wound #1 Right,Distal,Medial Lower Leg o Change dressing every week Follow-up Appointments Wound #1 Right,Distal,Medial Lower Leg o Return Appointment in 1 week. Edema Control o 4-Layer Compression System - Right Lower Extremity o Elevate legs to the level of the heart and pump ankles as often as possible - and when sitting Electronic Signature(s) Signed: 03/03/2015 4:43:13 PM By: Christin Fudge MD, FACS Signed: 03/03/2015 6:09:37 PM By: Gretta Cool RN, [name], [name], [name] Branford, Vicco ([id number]) Entered By: Gretta Cool, RN, [name], Kim on 03/03/2015 08:27:49 Gray, [name] ([id number]) -------------------------------------------------------------------------------- Problem List Details Sotomayor, 03/03/2015 8:00 Patient Name: Date of Service: [facility] AM Medical Record Patient Account Number: [id number] Number: Treating RN: Cornell Barman Date of Birth/Sex: 08-15-1950 (64 y.o. 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 01/27/2015 Yes I83.213 Varicose veins of right lower extremity with both ulcer of 01/27/2015 Yes ankle and inflammation L97.312 Non-pressure chronic ulcer of right ankle with  fat layer 01/27/2015 Yes exposed F17.209 Nicotine dependence, unspecified, with unspecified 01/27/2015 Yes nicotine-induced disorders Inactive Problems Resolved Problems Electronic Signature(s) Signed: 03/03/2015 8:50:52 AM By: Christin Fudge MD, FACS Entered By: Christin Fudge on 03/03/2015 08:50:52 Mcdermid, [name] ([id number]) -------------------------------------------------------------------------------- Progress Note Details Smet, 03/03/2015 8:00 Patient Name: Date of Service: [facility] AM Medical Record Patient Account Number: [id number] Number: Treating RN: Cornell Barman Date of Birth/Sex: Apr 12, 1951 (64 y.o. Male) Other Clinician: Primary Care Physician: PATIENT, NO Treating Brailynn Breth Referring Physician: Royetta Crochet Physician/Extender: Weeks in Treatment: 5 Subjective Chief Complaint Information obtained from Patient Patient presents for treatment of an open ulcer due to venous insufficiency. He says he noticed a "  into the right medial ankle for about 3-4 weeks History of Present Illness (HPI) The following HPI elements were documented for the patient's wound: Location: open wound just above his right ankle Quality: Patient reports experiencing a dull pain to affected area(s). Severity: Patient states wound are getting worse. Duration: Patient has had the wound for < 4 weeks prior to presenting for treatment Timing: Pain in wound is Intermittent (comes and goes Context: The wound appeared gradually over time Modifying Factors: Consults to this date include: he was seen in the ER and was referred to a vascular surgeon but the patient has not done that. He may have been treated with clindamycin in the ER. Associated Signs and Symptoms: Patient reports having difficulty standing for long periods. 64 year old gentleman who was seen in the emergency department recently on 01/06/2015 for a wound of his right lower extremity which he says was not involving  any injury and he did not know how he sustained it. He had draining foul-smelling liquid from the area and had gone for care there. his past medical history is significant for DVT, hypertension, gout, tobacco abuse, cocaine abuse, stroke, atrial fibrillation, pulmonary embolism. he has also had some vascular surgery with a stent placed in his leg. He has been a smoker for many years and has given up straight drugs several years ago. He continues to smoke about 4-5 cigarettes a day. 02/03/2015 -- received a note from 05/14/2013 where Dr. Leotis Pain placed an inferior vena cava filter. The patient had a deep vein thrombosis while therapeutic on anticoagulation for previous DVT and a IVC filter was placed for this. 02/10/2015 -- he did have his vascular test done on Friday but we have no reports yet. 02/17/2015 -- notes were reviewed from the vascular office and the patient had a venous ultrasound done which revealed that he had no reflux in the greater saphenous vein or the short saphenous vein bilaterally. He did have subacute DVT in the common femoral vein and popliteal veins on the right and left side. The recommendation was to continue with Unna's boot therapy at the wound clinic and then to wear graduated compression stockings once the ulcers healed and later if he had continuous problems lymphedema pump would benefit him. Rocca, [name] ([id number]) Objective Constitutional Pulse regular. Respirations normal and unlabored. Afebrile. Vitals Time Taken: 8:12 AM, Height: 74 in, Weight: 238 lbs, BMI: 30.6, Temperature: 98.0 F, Pulse: 62 bpm, Respiratory Rate: 20 breaths/min, Blood Pressure: 147/91 mmHg. Eyes Nonicteric. Reactive to light. Ears, Nose, Mouth, and Throat Lips, teeth, and gums WNL.[name] Moist mucosa without lesions . Neck supple and nontender. No palpable supraclavicular or cervical adenopathy. Normal sized without goiter. Respiratory WNL. No  retractions.. Cardiovascular Pedal Pulses WNL. No clubbing, cyanosis or edema. Lymphatic No adneopathy. No adenopathy. No adenopathy. Musculoskeletal Adexa without tenderness or enlargement.. Digits and nails w/o clubbing, cyanosis, infection, petechiae, ischemia, or inflammatory conditions.[name] Psychiatric Judgement and insight Intact.. No evidence of depression, anxiety, or agitation.. General Notes: the ulceration on the right lower eczema and is looking much better and has minimal debris which was washed out with moist saline and irrigated. Integumentary (Hair, Skin) No suspicious lesions. No crepitus or fluctuance. No peri-wound warmth or erythema. No masses.. Wound #1 status is Open. Original cause of wound was Gradually Appeared. The wound is located on the Right,Distal,Medial Lower Leg. The wound measures 4.2cm length x 1.7cm width x 0.3cm depth; 5.608cm^2 area and 1.682cm^3 volume. The wound is limited to skin breakdown. There is  a medium Norbeck, Jerl ([id number]) amount of serosanguineous drainage noted. The wound margin is flat and intact. There is large (67-100%) red, pink granulation within the wound bed. There is a small (1-33%) amount of necrotic tissue within the wound bed including Adherent Slough. The periwound skin appearance exhibited: Moist. The periwound skin appearance did not exhibit: Callus, Crepitus, Excoriation, Fluctuance, Friable, Induration, Localized Edema, Rash, Scarring, Dry/Scaly, Maceration, Atrophie Blanche, Cyanosis, Ecchymosis, Hemosiderin Staining, Mottled, Pallor, Rubor, Erythema. Periwound temperature was noted as No Abnormality. The periwound has tenderness on palpation. 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 he is encouraged to elevate his limbs as much as possible. When the ulceration gets a little cleaner we will use a skin substitute and also ordered him some compression stockings. He will come back and see me next week. Plan Wound Cleansing: Wound #1 Right,Distal,Medial Lower Leg: Clean wound with Normal Saline. Cleanse wound with mild soap and water Anesthetic: Wound #1 Right,Distal,Medial Lower Leg: Topical Lidocaine 4% cream applied to wound bed prior to debridement Primary Wound Dressing: Wound #1 Right,Distal,Medial Lower Leg: Other: - Sorbact Secondary Dressing: Wound #1 Right,Distal,Medial Lower Leg: ABD pad Dressing Change Frequency: Yeoman, Dayan ([id number]) Wound #1 Right,Distal,Medial Lower Leg: Change dressing every week Follow-up Appointments: Wound #1 Right,Distal,Medial Lower Leg: Return Appointment in 1 week. Edema Control: 4-Layer Compression System - Right Lower Extremity Elevate legs to the level of the heart and pump ankles as often as possible - and when sitting I am going to recommend Sorbact on the wound and then a Profore 4-layer compression. he is encouraged to elevate his limbs as much as possible. When the ulceration gets a little cleaner we will use a skin substitute and also ordered him some compression stockings. He will come back and see me next week. Electronic Signature(s) Signed: 03/03/2015 8:52:36 AM By: Christin Fudge MD, FACS Entered By: Christin Fudge on 03/03/2015 08:52:36 Oltmann, [name] ([id number]) -------------------------------------------------------------------------------- SuperBill Details Patient Name: [name] Date of Service: 03/03/2015 Medical Record Number: [id number] Patient Account Number: [id number] Date of Birth/Sex:  1951/03/15 (64 y.o. 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 I83.213 Varicose veins of right lower extremity with both ulcer of ankle and inflammation L97.312 Non-pressure chronic ulcer of right ankle with fat layer exposed F17.209 Nicotine dependence, unspecified, with unspecified nicotine-induced disorders Facility Procedures CPT4: Description Modifier Quantity Code [id number] (Facility Use Only) [id number] - [place] [facility] LWR RT 1 LEG Physician Procedures CPT4: Description Modifier Quantity Code [id number] - WC PHYS LEVEL 3 - EST PT 1 ICD-10 Description Diagnosis 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

## 2015-03-10 ENCOUNTER — Encounter: Payer: Medicare Other | Admitting: Surgery

## 2015-03-10 DIAGNOSIS — L97312 Non-pressure chronic ulcer of right ankle with fat layer exposed: Secondary | ICD-10-CM | POA: Diagnosis not present

## 2015-03-10 NOTE — Progress Notes (Addendum)
[name], [name] ([id number]) Visit Report for 03/10/2015 Chief Complaint Document Details [name], 03/10/2015 8:00 Patient Name: Date of Service: [facility] AM Medical Record Patient Account Number: [id number] Number: Treating RN: Cornell Barman Date of Birth/Sex: 15-Aug-1950 (64 y.o. Male) Other Clinician: Primary Care Physician: PATIENT, NO Treating Kellyn Mccary Referring Physician: Royetta Crochet Physician/Extender: Weeks in Treatment: 6 Information Obtained from: Patient Chief Complaint Patient presents for treatment of an open ulcer due to venous insufficiency. He says he noticed a "into the right medial ankle for about 3-4 weeks Electronic Signature(s) Signed: 03/10/2015 8:19:01 AM By: Christin Fudge MD, FACS Entered By: Christin Fudge on 03/10/2015 08:19:00 [name] ([id number]) -------------------------------------------------------------------------------- HPI Details [name], 03/10/2015 8:00 Patient Name: Date of Service: [name] AM Medical Record Patient Account Number: [id number] Number: Treating RN: Cornell Barman Date of Birth/Sex: June 24, 1950 (64 y.o. Male) Other Clinician: Primary Care Physician: PATIENT, NO Treating Aldahir Litaker Referring Physician: Royetta Crochet Physician/Extender: Weeks in Treatment: 6 History of Present Illness Location: open wound just above his right ankle Quality: Patient reports experiencing a dull pain to affected area(s). Severity: Patient states wound are getting worse. Duration: Patient has had the wound for < 4 weeks prior to presenting for treatment Timing: Pain in wound is Intermittent (comes and goes Context: The wound appeared gradually over time Modifying Factors: Consults to this date include: he was seen in the ER and was referred to a vascular surgeon but the patient has not done that. He may have been treated with clindamycin in the ER. Associated Signs and Symptoms: Patient reports having  difficulty standing for long periods. HPI Description: 64 year old gentleman who was seen in the emergency department recently on 01/06/2015 for a wound of his right lower extremity which he says was not involving any injury and he did not know how he sustained it. He had draining foul-smelling liquid from the area and had gone for care there. his past medical history is significant for DVT, hypertension, gout, tobacco abuse, cocaine abuse, stroke, atrial fibrillation, pulmonary embolism. he has also had some vascular surgery with a stent placed in his leg. He has been a smoker for many years and has given up straight drugs several years ago. He continues to smoke about 4-5 cigarettes a day. 02/03/2015 -- received a note from 05/14/2013 where Dr. Leotis Pain placed an inferior vena cava filter. The patient had a deep vein thrombosis while therapeutic on anticoagulation for previous DVT and a IVC filter was placed for this. 02/10/2015 -- he did have his vascular test done on Friday but we have no reports yet. 02/17/2015 -- notes were reviewed from the vascular office and the patient had a venous ultrasound done which revealed that he had no reflux in the greater saphenous vein or the short saphenous vein bilaterally. He did have subacute DVT in the common femoral vein and popliteal veins on the right and left side. The recommendation was to continue with Unna's boot therapy at the wound clinic and then to wear graduated compression stockings once the ulcers healed and later if he had continuous problems lymphedema pump would benefit him. Electronic Signature(s) Signed: 03/10/2015 8:19:26 AM By: Christin Fudge MD, FACS Entered By: Christin Fudge on 03/10/2015 08:19:26 [name] ([id number]) -------------------------------------------------------------------------------- Physical Exam Details [name], 03/10/2015 8:00 Patient Name: Date of Service: [name] AM Medical Record Patient  Account Number: [id number] Number: Treating RN: Cornell Barman Date of Birth/Sex: 11-10-1950 (64 y.o. Male) Other Clinician: Primary Care Physician: PATIENT, NO Treating Idalee Foxworthy Referring Physician: Royetta Crochet  Physician/Extender: Suella [place] in Treatment: 6 Constitutional . Pulse regular. Respirations normal and unlabored. Afebrile. . Eyes Nonicteric. Reactive to light. Ears, Nose, Mouth, and Throat Lips, teeth, and gums WNL.[name] Moist mucosa without lesions . Neck supple and nontender. No palpable supraclavicular or cervical adenopathy. Normal sized without goiter. Respiratory WNL. No retractions.. Cardiovascular Pedal Pulses WNL. No clubbing, cyanosis or edema. Lymphatic No adneopathy. No adenopathy. No adenopathy. Musculoskeletal Adexa without tenderness or enlargement.. Digits and nails w/o clubbing, cyanosis, infection, petechiae, ischemia, or inflammatory conditions.. Integumentary (Hair, Skin) No suspicious lesions. No crepitus or fluctuance. No peri-wound warmth or erythema. No masses.[name] Psychiatric Judgement and insight Intact.. No evidence of depression, anxiety, or agitation.. Notes The edema has gone down significantly and the ulcer is looking clean with minimal debris washed out with a saline gauze and irrigation. Electronic Signature(s) Signed: 03/10/2015 8:25:46 AM By: Christin Fudge MD, FACS Entered By: Christin Fudge on 03/10/2015 08:25:45 [name] ([id number]) -------------------------------------------------------------------------------- Physician Orders Details [name], 03/10/2015 8:00 Patient Name: Date of Service: [facility] AM Medical Record Patient Account Number: [id number] Number: Treating RN: Cornell Barman Date of Birth/Sex: 09-29-1950 (64 y.o. 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 o Cleanse wound with mild soap and water Anesthetic Wound #1 Right,Distal,Medial Lower Leg o Topical Lidocaine 4% cream applied to wound bed prior to debridement Primary Wound Dressing Wound #1 Right,Distal,Medial Lower Leg o Other: - Sorbact Secondary Dressing Wound #1 Right,Distal,Medial Lower Leg o ABD pad Dressing Change Frequency Wound #1 Right,Distal,Medial Lower Leg o Change dressing every week Follow-up Appointments Wound #1 Right,Distal,Medial Lower Leg o Return Appointment in 1 week. Rybicki, Juleen [name] ([id number]) Edema Control o 4-Layer Compression System - Right Lower Extremity o Elevate legs to the level of the heart and pump ankles as often as possible - and when sitting Electronic Signature(s) Signed: 03/10/2015 12:05:52 PM By: Gretta Cool, RN, [name], [name], [name] Signed: 03/10/2015 2:14:21 PM By: Christin Fudge MD, FACS Entered By: Gretta Cool, RN, [name], Kim on 03/10/2015 08:23:19 Travieso, Juleen [name] ([id number]) -------------------------------------------------------------------------------- Problem List Details Olesen, 03/10/2015 8:00 Patient Name: Date of Service: [facility] [name] AM Medical Record Patient Account Number: [id number] Number: Treating RN: Cornell Barman Date of Birth/Sex: November 12, 1950 (63 y.o. 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 01/27/2015 Yes I83.213 Varicose veins of right lower extremity with both ulcer of 01/27/2015 Yes ankle and inflammation L97.312 Non-pressure chronic ulcer of right ankle with fat layer 01/27/2015 Yes exposed F17.209 Nicotine dependence, unspecified, with unspecified 01/27/2015 Yes nicotine-induced disorders Inactive Problems Resolved Problems Electronic Signature(s) Signed: 03/10/2015 8:18:54 AM By: Christin Fudge MD, FACS Entered By: Christin Fudge on 03/10/2015 08:18:53 Nishikawa, Juleen [name] ([id number]) -------------------------------------------------------------------------------- Progress Note Details Eckley, 03/10/2015 8:00 Patient Name: Date of Service: [facility] [name] AM Medical Record Patient Account Number: [id number] Number: Treating RN: Cornell Barman Date of Birth/Sex: 10/19/1950 (  64 y.o. Male) Other Clinician: Primary Care Physician: PATIENT, NO Treating Dossie Ocanas Referring Physician: Royetta Crochet Physician/Extender: Weeks in Treatment: 6 Subjective Chief Complaint Information obtained from Patient Patient presents for treatment of an open ulcer due to venous insufficiency. He says he noticed a "into the right medial ankle for about 3-4 weeks History of Present Illness (HPI) The following HPI elements were documented for the patient's wound: Location: open wound just above his right ankle Quality: Patient reports experiencing a dull pain to affected area(s). Severity: Patient states wound are getting worse. Duration: Patient has had the wound for < 4 weeks prior to presenting for treatment Timing: Pain in wound is Intermittent (comes and goes Context: The wound appeared gradually over time Modifying Factors: Consults to this date include: he was seen in the ER and was  referred to a vascular surgeon but the patient has not done that. He may have been treated with clindamycin in the ER. Associated Signs and Symptoms: Patient reports having difficulty standing for long periods. 64 year old gentleman who was seen in the emergency department recently on 01/06/2015 for a wound of his right lower extremity which he says was not involving any injury and he did not know how he sustained it. He had draining foul-smelling liquid from the area and had gone for care there. his past medical history is significant for DVT, hypertension, gout, tobacco abuse, cocaine abuse, stroke, atrial fibrillation, pulmonary embolism. he has also had some vascular surgery with a stent placed in his leg. He has been a smoker for many years and has given up straight drugs several years ago. He continues to smoke about 4-5 cigarettes a day. 02/03/2015 -- received a note from 05/14/2013 where Dr. Leotis Pain placed an inferior vena cava filter. The patient had a deep vein thrombosis while therapeutic on anticoagulation for previous DVT and a IVC filter was placed for this. 02/10/2015 -- he did have his vascular test done on Friday but we have no reports yet. 02/17/2015 -- notes were reviewed from the vascular office and the patient had a venous ultrasound done which revealed that he had no reflux in the greater saphenous vein or the short saphenous vein bilaterally. He did have subacute DVT in the common femoral vein and popliteal veins on the right and left side. The recommendation was to continue with Unna's boot therapy at the wound clinic and then to wear graduated compression stockings once the ulcers healed and later if he had continuous problems lymphedema pump would benefit him. Oleski, Juleen [name] ([id number]) Objective Constitutional Pulse regular. Respirations normal and unlabored. Afebrile. Vitals Time Taken: 8:08 AM, Height: 74 in, Weight: 238 lbs, BMI: 30.6, Temperature: 97.7  F, Pulse: 56 bpm, Respiratory Rate: 18 breaths/min, Blood Pressure: 134/77 mmHg. Eyes Nonicteric. Reactive to light. Ears, Nose, Mouth, and Throat Lips, teeth, and gums WNL.[name] Moist mucosa without lesions . Neck supple and nontender. No palpable supraclavicular or cervical adenopathy. Normal sized without goiter. Respiratory WNL. No retractions.. Cardiovascular Pedal Pulses WNL. No clubbing, cyanosis or edema. Lymphatic No adneopathy. No adenopathy. No adenopathy. Musculoskeletal Adexa without tenderness or enlargement.. Digits and nails w/o clubbing, cyanosis, infection, petechiae, ischemia, or inflammatory conditions.[name] Psychiatric Judgement and insight Intact.. No evidence of depression, anxiety, or agitation.. General Notes: The edema has gone down significantly and the ulcer is looking clean with minimal debris washed out with a saline gauze and irrigation. Integumentary (Hair, Skin) No suspicious lesions. No crepitus or fluctuance. No peri-wound warmth or erythema. No masses.. Wound #1  status is Open. Original cause of wound was Gradually Appeared. The wound is located on the Right,Distal,Medial Lower Leg. The wound measures 4.4cm length x 1.6cm width x 0.2cm depth; 5.529cm^2 area and 1.106cm^3 volume. The wound is limited to skin breakdown. There is a medium Tomlinson, Stratton ([id number]) amount of serosanguineous drainage noted. The wound margin is flat and intact. There is large (67-100%) red, pink granulation within the wound bed. There is a small (1-33%) amount of necrotic tissue within the wound bed including Adherent Slough. The periwound skin appearance exhibited: Moist. The periwound skin appearance did not exhibit: Callus, Crepitus, Excoriation, Fluctuance, Friable, Induration, Localized Edema, Rash, Scarring, Dry/Scaly, Maceration, Atrophie Blanche, Cyanosis, Ecchymosis, Hemosiderin Staining, Mottled, Pallor, Rubor, Erythema. Periwound temperature was noted as No  Abnormality. The periwound has tenderness on palpation. 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 Cleanse wound with mild soap and water Anesthetic: Wound #1 Right,Distal,Medial Lower Leg: Topical Lidocaine 4% cream applied to wound bed prior to debridement Primary Wound Dressing: Wound #1 Right,Distal,Medial Lower Leg: Other: - Sorbact Secondary Dressing: Wound #1 Right,Distal,Medial Lower Leg: ABD pad Dressing Change Frequency: Wound #1 Right,Distal,Medial Lower Leg: Change dressing every week Follow-up Appointments: Wound #1 Right,Distal,Medial Lower Leg: Return Appointment in 1 week. Edema Control: 4-Layer Compression System - Right Lower Extremity Elevate legs to the level of the heart and pump ankles as often as possible - and when sitting [place], Jaecob ([id number]) I have recommended we continue with Sorbact and a 4-layer Profore. We will continue to monitor him with regards cellular tissue-based product. Electronic Signature(s) Signed: 03/10/2015 8:26:36 AM By: Christin Fudge MD, FACS Entered By: Christin Fudge on 03/10/2015 08:26:36 Moulton, Juleen [name] ([id number]) -------------------------------------------------------------------------------- SuperBill Details Patient Name: [name] Date of Service: 03/10/2015 Medical Record Number: [id number] Patient Account Number: [id number] Date of Birth/Sex: 03/20/1951 (64 y.o. 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 right ankle with fat layer exposed F17.209 Nicotine dependence, unspecified, with unspecified nicotine-induced disorders Facility Procedures CPT4: Description Modifier Quantity Code [id number] (Facility Use Only) [id number] - Graham RT 1 LEG Physician Procedures CPT4: Description Modifier Quantity Code [id number] - WC PHYS LEVEL 3 - EST PT 1 ICD-10 Description Diagnosis 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

## 2015-03-10 NOTE — Progress Notes (Signed)
[name], [name] ([id number]) Visit Report for 03/10/2015 Arrival Information Details Patient Name: [name], [name] Date of Service: 03/10/2015 8:00 AM Medical Record Number: [id number] Patient Account Number: [id number] Date of Birth/Sex: 09/27/1950 (64 y.o. Male) Treating RN: Cornell Barman Primary Care Physician: PATIENT, NO Other Clinician: Referring Physician: Royetta Crochet Treating Physician/Extender: Frann Rider in Treatment: 6 Visit Information History Since Last Visit Added or deleted any medications: No Patient Arrived: Ambulatory Any new allergies or adverse reactions: No Arrival Time: 08:08 Had a fall or experienced change in No Accompanied By: self activities of daily living that may affect Transfer Assistance: None risk of falls: Patient Identification Verified: Yes Signs or symptoms of abuse/neglect since last No Secondary Verification Process Yes visito Completed: Hospitalized since last visit: No Patient Has Alerts: Yes Has Dressing in Place as Prescribed: Yes Patient Alerts: Patient on Blood Pain Present Now: No Thinner warfarin ABI L 1.22 R 1.15 Electronic Signature(s) Signed: 03/10/2015 12:05:52 PM By: Gretta Cool, RN, [name], [name], [name] Entered By: Gretta Cool, RN, [name], Kim on 03/10/2015 08:08:20 Angell, [name] ([id number]) -------------------------------------------------------------------------------- Encounter Discharge Information Details Patient Name: [name] Date of Service: 03/10/2015 8:00 AM Medical Record Number: [id number] Patient Account Number: [id number] Date of Birth/Sex: 1950/09/06 (64 y.o. Male) Treating RN: Cornell Barman Primary Care Physician: PATIENT, NO Other Clinician: Referring Physician: Royetta Crochet Treating Physician/Extender: Frann Rider in Treatment: 6 Encounter Discharge Information Items Discharge Pain Level: 1 Discharge Condition: Stable Ambulatory Status: Ambulatory Discharge Destination:  Home Transportation: Private Auto Accompanied By: self Schedule Follow-up Appointment: Yes Medication Reconciliation completed and provided to Patient/Care Yes Lanson Randle: Provided on Clinical Summary of Care: 03/10/2015 Form Type Recipient Paper Patient WD Electronic Signature(s) Signed: 03/10/2015 8:35:03 AM By: Ruthine Dose Entered By: Ruthine Dose on 03/10/2015 08:35:03 Coleman, [name] ([id number]) -------------------------------------------------------------------------------- Lower Extremity Assessment Details Patient Name: [name] Date of Service: 03/10/2015 8:00 AM Medical Record Number: [id number] Patient Account Number: [id number] Date of Birth/Sex: June 17, 1950 (64 y.o. Male) Treating RN: Cornell Barman Primary Care Physician: PATIENT, NO Other Clinician: Referring Physician: Royetta Crochet Treating Physician/Extender: Frann Rider in Treatment: 6 Edema Assessment Assessed: [Left: No] [Right: No] E[Left: dema] [Right: :] Calf Left: Right: Point of Measurement: 39 cm From Medial Instep cm 42.2 cm Ankle Left: Right: Point of Measurement: 12 cm From Medial Instep cm 29.2 cm Vascular Assessment Pulses: Posterior Tibial Dorsalis Pedis Palpable: [Right:Yes] Extremity colors, hair growth, and conditions: Extremity Color: [Right:Hyperpigmented] Hair Growth on Extremity: [Right:Yes] Temperature of Extremity: [Right:Warm] Capillary Refill: [Right:< 3 seconds] Toe Nail Assessment Left: Right: Thick: Yes Discolored: Yes Deformed: No Improper Length and Hygiene: No Electronic Signature(s) Signed: 03/10/2015 12:05:52 PM By: Gretta Cool, RN, [name], [name], [name] Entered By: Gretta Cool, RN, [name], Kim on 03/10/2015 08:13:47 Chaikin, [name] ([id number]) -------------------------------------------------------------------------------- Multi Wound Chart Details Patient Name: [name] Date of Service: 03/10/2015 8:00 AM Medical Record Number:  [id number] Patient Account Number: [id number] Date of Birth/Sex: 1950/10/05 (64 y.o. Male) Treating RN: Cornell Barman Primary Care Physician: PATIENT, NO Other Clinician: Referring Physician: Royetta Crochet Treating Physician/Extender: Frann Rider in Treatment: 6 Vital Signs Height(in): 74 Pulse(bpm): 56 Weight(lbs): 238 Blood Pressure 134/77 (mmHg): Body Mass Index(BMI): 31 Temperature(F): 97.7 Respiratory Rate 18 (breaths/min): Photos: [1:No Photos] [N/A:N/A] Wound Location: [1:Right Lower Leg - Medial, N/A Distal] Wounding Event: [1:Gradually Appeared] [N/A:N/A] Primary Etiology: [1:Venous Leg Ulcer] [N/A:N/A] Comorbid History: [1:Arrhythmia, Hypertension, N/A Gout, Neuropathy] Date Acquired: [1:01/06/2015] [N/A:N/A] Weeks of Treatment: [1:6] [N/A:N/A] Wound Status: [1:Open] [N/A:N/A] Measurements L x W x D 4.4x1.6x0.2 [N/A:N/A] (cm) Area (cm) : [  1:5.529] [N/A:N/A] Volume (cm) : [1:1.106] [N/A:N/A] % Reduction in Area: [1:33.50%] [N/A:N/A] % Reduction in Volume: 66.70% [N/A:N/A] Classification: [1:Full Thickness Without Exposed Support Structures] [N/A:N/A] Exudate Amount: [1:Medium] [N/A:N/A] Exudate Type: [1:Serosanguineous] [N/A:N/A] Exudate Color: [1:red, brown] [N/A:N/A] Wound Margin: [1:Flat and Intact] [N/A:N/A] Granulation Amount: [1:Large (67-100%)] [N/A:N/A] Granulation Quality: [1:Red, Pink] [N/A:N/A] Necrotic Amount: [1:Small (1-33%)] [N/A:N/A] Exposed Structures: [1:Fascia: No Fat: No Tendon: No Muscle: No] [N/A:N/A] Joint: No Bone: No Limited to Skin Breakdown Epithelialization: Small (1-33%) N/A N/A Periwound Skin Texture: Edema: No N/A N/A Excoriation: No Induration: No Callus: No Crepitus: No Fluctuance: No Friable: No Rash: No Scarring: No Periwound Skin Moist: Yes N/A N/A Moisture: Maceration: No Dry/Scaly: No Periwound Skin Color: Atrophie Blanche: No N/A N/A Cyanosis: No Ecchymosis: No Erythema: No Hemosiderin Staining:  No Mottled: No Pallor: No Rubor: No Temperature: No Abnormality N/A N/A Tenderness on Yes N/A N/A Palpation: Wound Preparation: Ulcer Cleansing: N/A N/A Rinsed/Irrigated with Saline Topical Anesthetic Applied: Other: lidocaine 4% Treatment Notes Electronic Signature(s) Signed: 03/10/2015 12:05:52 PM By: Gretta Cool, RN, [name], [name], [name] Entered By: Gretta Cool, RN, [name], Kim on 03/10/2015 08:19:09 Keitt, [name] ([id number]) -------------------------------------------------------------------------------- Multi-Disciplinary Care Plan Details Patient Name: [name] Date of Service: 03/10/2015 8:00 AM Medical Record Number: [id number] Patient Account Number: [id number] Date of Birth/Sex: November 19, 1950 (64 y.o. Male) Treating RN: Cornell Barman Primary Care Physician: PATIENT, NO Other Clinician: Referring Physician: Royetta Crochet Treating Physician/Extender: Frann Rider in Treatment: 6 Active Inactive Abuse / Safety / Falls / Self Care Management Nursing Diagnoses: Impaired physical mobility Potential for falls Goals: Patient will remain injury free Date Initiated: 01/27/2015 Goal Status: Active Interventions: Assess fall risk on admission and as needed Notes: Orientation to the Wound Care Program Nursing Diagnoses: Knowledge deficit related to the wound healing center program Goals: Patient/caregiver will verbalize understanding of the [facility] Date Initiated: 01/27/2015 Goal Status: Active Interventions: Provide education on orientation to the wound center Notes: Venous Leg Ulcer Nursing Diagnoses: Potential for venous Insuffiency (use before diagnosis confirmed) Goals: Non-invasive venous studies are completed as ordered TIMMY, BUBECK ([id number]) Date Initiated: 01/27/2015 Goal Status: Active Patient will maintain optimal edema control Date Initiated: 01/27/2015 Goal Status: Active Interventions: Assess peripheral edema status  every visit. Compression as ordered Notes: Wound/Skin Impairment Nursing Diagnoses: Impaired tissue integrity Goals: Ulcer/skin breakdown will have a volume reduction of 30% by week 4 Date Initiated: 01/27/2015 Goal Status: Active Ulcer/skin breakdown will have a volume reduction of 50% by week 8 Date Initiated: 01/27/2015 Goal Status: Active Ulcer/skin breakdown will have a volume reduction of 80% by week 12 Date Initiated: 01/27/2015 Goal Status: Active Ulcer/skin breakdown will heal within 14 weeks Date Initiated: 01/27/2015 Goal Status: Active Interventions: Assess ulceration(s) every visit Notes: Electronic Signature(s) Signed: 03/10/2015 12:05:52 PM By: Gretta Cool, RN, [name], [name], [name] Entered By: Gretta Cool, RN, [name], Kim on 03/10/2015 08:19:03 Mohrmann, [name] ([id number]) -------------------------------------------------------------------------------- Pain Assessment Details Patient Name: [name] Date of Service: 03/10/2015 8:00 AM Medical Record Number: [id number] Patient Account Number: [id number] Date of Birth/Sex: November 19, 1950 (64 y.o. Male) Treating RN: Cornell Barman Primary Care Physician: PATIENT, NO Other Clinician: Referring Physician: Royetta Crochet Treating Physician/Extender: Frann Rider in Treatment: 6 Active Problems Location of Pain Severity and Description of Pain Patient Has Paino No Site Locations Pain Management and Medication Current Pain Management: Electronic Signature(s) Signed: 03/10/2015 12:05:52 PM By: Gretta Cool, RN, [name], [name], [name] Entered By: Gretta Cool, RN, [name], Kim on 03/10/2015 08:08:26 Avon, [name] ([id number]) -------------------------------------------------------------------------------- Patient/Caregiver Education Details Patient Name:  Morozov, [name] Date of Service: 03/10/2015 8:00 AM Medical Record Number: [id number] Patient Account Number: [id number] Date of Birth/Gender: 1950/09/11 (64 y.o. Male) Treating RN: Cornell Barman Primary Care Physician: PATIENT, NO Other Clinician: Referring Physician: Royetta Crochet Treating Physician/Extender: Frann Rider in Treatment: 6 Education Assessment Education Provided To: Patient Education Topics Provided Wound/Skin Impairment: Handouts: Caring for Your Ulcer, Other: Continue wound care as prescribed Methods: Demonstration Responses: State content correctly Electronic Signature(s) Signed: 03/10/2015 12:05:52 PM By: Gretta Cool, RN, [name], [name], [name] Entered By: Gretta Cool, RN, [name], Kim on 03/10/2015 08:25:10 Hevener, [name] ([id number]) -------------------------------------------------------------------------------- Wound Assessment Details Patient Name: [name] Date of Service: 03/10/2015 8:00 AM Medical Record Number: [id number] Patient Account Number: [id number] Date of Birth/Sex: 08-22-1950 (64 y.o. Male) Treating RN: Cornell Barman Primary Care Physician: PATIENT, NO Other Clinician: Referring Physician: Royetta Crochet Treating Physician/Extender: Frann Rider in Treatment: 6 Wound Status Wound Number: 1 Primary Venous Leg Ulcer Etiology: Wound Location: Right Lower Leg - Medial, Distal Wound Status: Open Wounding Event: Gradually Appeared Comorbid Arrhythmia, Hypertension, Gout, History: Neuropathy Date Acquired: 01/06/2015 Weeks Of Treatment: 6 Clustered Wound: No Photos Photo Uploaded By: Gretta Cool, RN, [name], Kim on 03/10/2015 11:50:52 Wound Measurements Length: (cm) 4.4 % Reduction in Ar Width: (cm) 1.6 % Reduction in Vo Depth: (cm) 0.2 Epithelialization Area: (cm) 5.529 Volume: (cm) 1.106 ea: 33.5% lume: 66.7% : Small (1-33%) Wound Description Full Thickness Without Exposed Foul Odor After Classification: Support Structures Wound Margin: Flat and Intact Medium Gebhard, Inmer ([id number]) Cleansing: No Exudate Amount: Exudate Type: Serosanguineous Exudate Color: red, brown Wound Bed Granulation Amount: Large  (67-100%) Exposed Structure Granulation Quality: Red, Pink Fascia Exposed: No Necrotic Amount: Small (1-33%) Fat Layer Exposed: No Necrotic Quality: Adherent Slough Tendon Exposed: No Muscle Exposed: No Joint Exposed: No Bone Exposed: No Limited to Skin Breakdown Periwound Skin Texture Texture Color No Abnormalities Noted: No No Abnormalities Noted: No Callus: No Atrophie Blanche: No Crepitus: No Cyanosis: No Excoriation: No Ecchymosis: No Fluctuance: No Erythema: No Friable: No Hemosiderin Staining: No Induration: No Mottled: No Localized Edema: No Pallor: No Rash: No Rubor: No Scarring: No Temperature / Pain Moisture Temperature: No Abnormality No Abnormalities Noted: No Tenderness on Palpation: Yes Dry / Scaly: No Maceration: No Moist: Yes Wound Preparation Ulcer Cleansing: Rinsed/Irrigated with Saline Topical Anesthetic Applied: Other: lidocaine 4%, Treatment Notes Wound #1 (Right, Distal, Medial Lower Leg) 1. Cleansed with: Cleanse wound with antibacterial soap and water 2. Anesthetic Topical Lidocaine 4% cream to wound bed prior to debridement 4. Dressing Applied: Other dressing (specify in notes) 5. Secondary Dressing Applied Carol, Gamaliel ([id number]) ABD Pad 7. Secured with 4-Layer Compression System - Right Lower Extremity Notes Sorbact applied to wound Electronic Signature(s) Signed: 03/10/2015 12:05:52 PM By: Gretta Cool, RN, [name], [name], [name] Entered By: Gretta Cool, RN, [name], Kim on 03/10/2015 08:15:23 Sizelove, [name] ([id number]) -------------------------------------------------------------------------------- Vitals Details Patient Name: [name] Date of Service: 03/10/2015 8:00 AM Medical Record Number: [id number] Patient Account Number: [id number] Date of Birth/Sex: Apr 30, 1951 (64 y.o. Male) Treating RN: Cornell Barman Primary Care Physician: PATIENT, NO Other Clinician: Referring Physician: Royetta Crochet Treating  Physician/Extender: Frann Rider in Treatment: 6 Vital Signs Time Taken: 08:08 Temperature (F): 97.7 Height (in): 74 Pulse (bpm): 56 Weight (lbs): 238 Respiratory Rate (breaths/min): 18 Body Mass Index (BMI): 30.6 Blood Pressure (mmHg): 134/77 Reference Range: 80 - 120 mg / dl Electronic Signature(s) Signed: 03/10/2015 12:05:52 PM By: Gretta Cool, RN, [name], [name], [name] Entered By: Gretta Cool, RN, [name], Kim on 03/10/2015 50:38:88

## 2015-03-17 ENCOUNTER — Encounter: Payer: Medicare Other | Admitting: Surgery

## 2015-03-17 DIAGNOSIS — L97312 Non-pressure chronic ulcer of right ankle with fat layer exposed: Secondary | ICD-10-CM | POA: Diagnosis not present

## 2015-03-17 NOTE — Progress Notes (Addendum)
[name], [name] ([id number]) Visit Report for [name] Chief Complaint Document Details Caccamo, [name] 8:00 Patient Name: Date of Service: [facility] AM Medical Record Patient Account Number: [id number] Number: Treating RN: Cornell Barman Date of Birth/Sex: September 05, 1950 (64 y.o. Male) Other Clinician: Primary Care Physician: PATIENT, NO Treating Charlette Hennings Referring Physician: Royetta Crochet Physician/Extender: Weeks in Treatment: 7 Information Obtained from: Patient Chief Complaint Patient presents for treatment of an open ulcer due to venous insufficiency. He says he noticed a "into the right medial ankle for about 3-4 weeks Electronic Signature(s) Signed: 03/17/2015 8:30:57 AM By: Christin Fudge MD, FACS Entered By: Christin Fudge on [name] 08:30:57 [name], [name] ([id number]) -------------------------------------------------------------------------------- HPI Details Mcgruder, [name] 8:00 Patient Name: Date of Service: [facility] AM Medical Record Patient Account Number: [id number] Number: Treating RN: Cornell Barman Date of Birth/Sex: 1950/10/12 (64 y.o. Male) Other Clinician: Primary Care Physician: PATIENT, NO Treating Evangelyne Loja Referring Physician: Royetta Crochet Physician/Extender: Weeks in Treatment: 7 History of Present Illness Location: open wound just above his right ankle Quality: Patient reports experiencing a dull pain to affected area(s). Severity: Patient states wound are getting worse. Duration: Patient has had the wound for < 4 weeks prior to presenting for treatment Timing: Pain in wound is Intermittent (comes and goes Context: The wound appeared gradually over time Modifying Factors: Consults to this date include: he was seen in the ER and was referred to a vascular surgeon but the patient has not done that. He may have been treated with clindamycin in the ER. Associated Signs and Symptoms: Patient reports having  difficulty standing for long periods. HPI Description: 64 year old gentleman who was seen in the emergency department recently on 01/06/2015 for a wound of his right lower extremity which he says was not involving any injury and he did not know how he sustained it. He had draining foul-smelling liquid from the area and had gone for care there. his past medical history is significant for DVT, hypertension, gout, tobacco abuse, cocaine abuse, stroke, atrial fibrillation, pulmonary embolism. he has also had some vascular surgery with a stent placed in his leg. He has been a smoker for many years and has given up straight drugs several years ago. He continues to smoke about 4-5 cigarettes a day. 02/03/2015 -- received a note from 05/14/2013 where Dr. Leotis Pain placed an inferior vena cava filter. The patient had a deep vein thrombosis while therapeutic on anticoagulation for previous DVT and a IVC filter was placed for this. 02/10/2015 -- he did have his vascular test done on Friday but we have no reports yet. 02/17/2015 -- notes were reviewed from the vascular office and the patient had a venous ultrasound done which revealed that he had no reflux in the greater saphenous vein or the short saphenous vein bilaterally. He did have subacute DVT in the common femoral vein and popliteal veins on the right and left side. The recommendation was to continue with Unna's boot therapy at the wound clinic and then to wear graduated compression stockings once the ulcers healed and later if he had continuous problems lymphedema pump would benefit him. [name] -- we have applied for his insurance and aide regarding cellular tissue-based products and are still awaiting the final clearance. Electronic Signature(s) Signed: 03/17/2015 8:31:38 AM By: Christin Fudge MD, FACS Entered By: Christin Fudge on [name] 08:31:38 [name], [name] ([id number]) [name], [name]  ([id number]) -------------------------------------------------------------------------------- Physical Exam Details Crist, [name] 8:00 Patient Name: Date of Service: [facility] AM Medical Record Patient Account Number: [id number] Number: Treating  RN: Cornell Barman Date of Birth/Sex: 01-Nov-1950 (64 y.o. Male) Other Clinician: Primary Care Physician: PATIENT, NO Treating Cori Henningsen Referring Physician: Royetta Crochet Physician/Extender: Weeks in Treatment: 7 Constitutional . Pulse regular. Respirations normal and unlabored. Afebrile. . Eyes Nonicteric. Reactive to light. Ears, Nose, Mouth, and Throat Lips, teeth, and gums WNL.[name] Moist mucosa without lesions . Neck supple and nontender. No palpable supraclavicular or cervical adenopathy. Normal sized without goiter. Respiratory WNL. No retractions.. Cardiovascular Pedal Pulses WNL. No clubbing, cyanosis and the edema in the right lower extremity is significantly less. Chest Breasts symmetical and no nipple discharge.. Breast tissue WNL, no masses, lumps, or tenderness.. Lymphatic No adneopathy. No adenopathy. No adenopathy. Musculoskeletal Adexa without tenderness or enlargement.. Digits and nails w/o clubbing, cyanosis, infection, petechiae, ischemia, or inflammatory conditions.. Integumentary (Hair, Skin) No suspicious lesions. No crepitus or fluctuance. No peri-wound warmth or erythema. No masses.[name] Psychiatric Judgement and insight Intact.. No evidence of depression, anxiety, or agitation.. Notes once the ulcer base was cleansed out with saline and gauze there is good resolution of the ulcer base being clean and healthy granulation tissue is seen. Electronic Signature(s) Signed: 03/17/2015 8:32:36 AM By: Christin Fudge MD, FACS Entered By: Christin Fudge on [name] 08:32:36 [name], [name] ([id number]) -------------------------------------------------------------------------------- Physician Orders  Details Balint, [name] 8:00 Patient Name: Date of Service: [facility] AM Medical Record Patient Account Number: [id number] Number: Treating RN: Cornell Barman Date of Birth/Sex: March 20, 1951 (64 y.o. Male) Other Clinician: Primary Care Physician: PATIENT, NO Treating Vianey Caniglia Referring Physician: Royetta Crochet Physician/Extender: Weeks in Treatment: 7 Verbal / Phone Orders: Yes Clinician: Cornell Barman Read Back and Verified: Yes Diagnosis Coding Wound Cleansing Wound #1 Right,Distal,Medial Lower Leg o Clean wound with Normal Saline. o Cleanse wound with mild soap and water Anesthetic Wound #1 Right,Distal,Medial Lower Leg o Topical Lidocaine 4% cream applied to wound bed prior to debridement Primary Wound Dressing Wound #1 Right,Distal,Medial Lower Leg o Other: - Sorbact Secondary Dressing Wound #1 Right,Distal,Medial Lower Leg o ABD pad - gauze Dressing Change Frequency Wound #1 Right,Distal,Medial Lower Leg o Change dressing every week Follow-up Appointments Wound #1 Right,Distal,Medial Lower Leg o Return Appointment in 1 week. Edema Control o 4-Layer Compression System - Right Lower Extremity o Elevate legs to the level of the heart and pump ankles as often as possible - and when sitting Electronic Signature(s) Signed: 03/17/2015 4:25:19 PM By: Christin Fudge MD, FACS Signed: 03/17/2015 5:27:16 PM By: Gretta Cool RN, [name], [name], [name] [name], [name] ([id number]) Entered By: Gretta Cool, RN, [name], Kim on [name] 08:50:37 [name], [name] ([id number]) -------------------------------------------------------------------------------- Problem List Details Kessen, [name] 8:00 Patient Name: Date of Service: [facility] AM Medical Record Patient Account Number: [id number] Number: Treating RN: Cornell Barman Date of Birth/Sex: 1950/11/16 (64 y.o. 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/27/2015 Yes exposed F17.209 Nicotine dependence, unspecified, with unspecified 01/27/2015 Yes nicotine-induced disorders Inactive Problems Resolved Problems Electronic Signature(s) Signed: 03/17/2015 8:30:45 AM By: Christin Fudge MD, FACS Entered By: Christin Fudge on [name] 08:30:45 [name], [name] ([id number]) -------------------------------------------------------------------------------- Progress Note Details [name], [name] 8:00 Patient Name: Date of Service: [facility] AM Medical Record Patient Account Number: [id number] Number: Treating RN:  Cornell Barman Date of Birth/Sex: 05-Feb-1951 (64 y.o. Male) Other Clinician: Primary Care Physician: PATIENT, NO Treating Gizelle Whetsel Referring Physician: Royetta Crochet Physician/Extender: Weeks in Treatment: 7 Subjective Chief Complaint Information obtained from Patient Patient presents for treatment of an open ulcer due to venous insufficiency. He says he noticed a "into the right medial ankle for about 3-4 weeks History of Present Illness (HPI) The following HPI elements were documented for the patient's wound: Location: open wound just above his right ankle Quality: Patient reports experiencing a dull pain to affected area(s). Severity: Patient states wound are getting worse. Duration: Patient has had the wound for < 4 weeks prior to presenting for treatment Timing: Pain in wound is Intermittent (comes and goes Context: The wound appeared gradually over time Modifying Factors: Consults to this date  include: he was seen in the ER and was referred to a vascular surgeon but the patient has not done that. He may have been treated with clindamycin in the ER. Associated Signs and Symptoms: Patient reports having difficulty standing for long periods. 64 year old gentleman who was seen in the emergency department recently on 01/06/2015 for a wound of his right lower extremity which he says was not involving any injury and he did not know how he sustained it. He had draining foul-smelling liquid from the area and had gone for care there. his past medical history is significant for DVT, hypertension, gout, tobacco abuse, cocaine abuse, stroke, atrial fibrillation, pulmonary embolism. he has also had some vascular surgery with a stent placed in his leg. He has been a smoker for many years and has given up straight drugs several years ago. He continues to smoke about 4-5 cigarettes a day. 02/03/2015 -- received a note from 05/14/2013 where Dr. Leotis Pain placed an inferior vena cava filter. The patient had a deep vein thrombosis while therapeutic on anticoagulation for previous DVT and a IVC filter was placed for this. 02/10/2015 -- he did have his vascular test done on Friday but we have no reports yet. 02/17/2015 -- notes were reviewed from the vascular office and the patient had a venous ultrasound done which revealed that he had no reflux in the greater saphenous vein or the short saphenous vein bilaterally. He did have subacute DVT in the common femoral vein and popliteal veins on the right and left side. The recommendation was to continue with Unna's boot therapy at the wound clinic and then to wear graduated compression stockings once the ulcers healed and later if he had continuous problems lymphedema pump would benefit him. [name], [name] ([id number]) [name] -- we have applied for his insurance and aide regarding cellular tissue-based products and are still awaiting the final  clearance. Objective Constitutional Pulse regular. Respirations normal and unlabored. Afebrile. Vitals Time Taken: 8:11 AM, Height: 74 in, Weight: 238 lbs, BMI: 30.6, Temperature: 97.7 F, Pulse: 58 bpm, Respiratory Rate: 18 breaths/min, Blood Pressure: 130/88 mmHg. Eyes Nonicteric. Reactive to light. Ears, Nose, Mouth, and Throat Lips, teeth, and gums WNL.[name] Moist mucosa without lesions . Neck supple and nontender. No palpable supraclavicular or cervical adenopathy. Normal sized without goiter. Respiratory WNL. No retractions.. Cardiovascular Pedal Pulses WNL. No clubbing, cyanosis and the edema in the right lower extremity is significantly less. Chest Breasts symmetical and no nipple discharge.. Breast tissue WNL, no masses, lumps, or tenderness.. Lymphatic No adneopathy. No adenopathy. No adenopathy. Musculoskeletal Adexa without tenderness or enlargement.. Digits and nails w/o clubbing, cyanosis, infection, petechiae, ischemia, or inflammatory conditions.[name] Psychiatric Judgement and insight Intact.[name]  No evidence of depression, anxiety, or agitation.. General Notes: once the ulcer base was cleansed out with saline and gauze there is good resolution of the ulcer base being clean and healthy granulation tissue is seen. Integumentary (Hair, Skin) [name], [name] ([id number]) No suspicious lesions. No crepitus or fluctuance. No peri-wound warmth or erythema. No masses.. Wound #1 status is Open. Original cause of wound was Gradually Appeared. The wound is located on the Right,Distal,Medial Lower Leg. The wound measures 5cm length x 2cm width x 0.2cm depth; 7.854cm^2 area and 1.571cm^3 volume. The wound is limited to skin breakdown. There is a medium amount of serosanguineous drainage noted. The wound margin is flat and intact. There is medium (34-66%) red, pink granulation within the wound bed. There is a medium (34-66%) amount of necrotic tissue within the wound bed including  Adherent Slough. The periwound skin appearance exhibited: Moist. The periwound skin appearance did not exhibit: Callus, Crepitus, Excoriation, Fluctuance, Friable, Induration, Localized Edema, Rash, Scarring, Dry/Scaly, Maceration, Atrophie Blanche, Cyanosis, Ecchymosis, Hemosiderin Staining, Mottled, Pallor, Rubor, Erythema. Periwound temperature was noted as No Abnormality. The periwound has tenderness on palpation. 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 We will await clearance of his cellular based tissue product and once available start applying it. We will also order compression stockings for him in anticipation of his wound healing and continued compression. He will see [place] back next week. Plan Wound Cleansing: Wound #1 Right,Distal,Medial Lower Leg: Clean wound with Normal Saline. Cleanse wound with mild soap and water Anesthetic: Wound #1 Right,Distal,Medial Lower Leg: Topical Lidocaine 4% cream applied to wound bed prior to debridement Primary Wound Dressing: Wound #1 Right,Distal,Medial Lower Leg: Other: - Sorbact Secondary Dressing: Wound #1 Right,Distal,Medial Lower Leg: [name], [name] ([id number]) ABD pad - gauze Dressing Change Frequency: Wound #1 Right,Distal,Medial Lower Leg: Change dressing every week Follow-up Appointments: Wound #1 Right,Distal,Medial Lower Leg: Return Appointment in 1 week. Edema Control: 4-Layer Compression System - Right Lower Extremity Elevate legs to the level of the heart and pump ankles as often as possible - and when sitting I would continue with Sorbact and  4-layer compression wrap. We will await clearance of his cellular based tissue product and once available start applying it. We will also order compression stockings for him in anticipation of his wound healing and continued compression. He will see [place] back next week. Electronic Signature(s) Signed: 03/18/2015 8:08:06 AM By: Christin Fudge MD, FACS Previous Signature: [name] 8:33:58 AM Version By: Christin Fudge MD, FACS Entered By: Christin Fudge on 03/18/2015 08:08:06 Karapetian, [name] ([id number]) -------------------------------------------------------------------------------- SuperBill Details Patient Name: Mark Cromer Date of Service: [name] Medical Record Number: [id number] Patient Account Number: [id number] Date of Birth/Sex: May 31, 1950 (64 y.o. 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 and inflammation L97.312 Non-pressure chronic ulcer of right ankle with fat layer exposed F17.209 Nicotine dependence, unspecified, with unspecified nicotine-induced disorders Facility Procedures CPT4: Description Modifier Quantity Code [id number] (Facility Use Only) [phone] -  APPLY MULTLAY COMPRS LWR RT 1 LEG Physician Procedures CPT4: Description Modifier Quantity Code [id number] - WC PHYS LEVEL 3 - EST PT 1 ICD-10 Description Diagnosis 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

## 2015-03-18 NOTE — Progress Notes (Signed)
PLES, TRUDEL ([id number]) Visit Report for 03/17/2015 Arrival Information Details Patient Name: [name], [name] Date of Service: 03/17/2015 8:00 AM Medical Record Number: [id number] Patient Account Number: [id number] Date of Birth/Sex: December 14, 1950 (64 y.o. Male) Treating RN: Cornell Barman Primary Care Physician: PATIENT, NO Other Clinician: Referring Physician: Royetta Crochet Treating Physician/Extender: Frann Rider in Treatment: 7 Visit Information History Since Last Visit Added or deleted any medications: Yes Patient Arrived: Ambulatory Any new allergies or adverse reactions: No Arrival Time: 08:12 Had a fall or experienced change in No Accompanied By: self activities of daily living that may affect Transfer Assistance: None risk of falls: Patient Identification Verified: Yes Signs or symptoms of abuse/neglect since last No Secondary Verification Process Yes visito Completed: Hospitalized since last visit: No Patient Has Alerts: Yes Has Dressing in Place as Prescribed: Yes Patient Alerts: Patient on Blood Has Compression in Place as Prescribed: Yes Thinner Pain Present Now: No warfarin ABI L 1.22 R 1.15 Electronic Signature(s) Signed: 03/17/2015 5:27:16 PM By: Gretta Cool, RN, [name], [name], [name] Entered By: Gretta Cool, RN, [name], Kim on 03/17/2015 08:13:04 [place], [name] ([id number]) -------------------------------------------------------------------------------- Encounter Discharge Information Details Patient Name: [name] Date of Service: 03/17/2015 8:00 AM Medical Record Number: [id number] Patient Account Number: [id number] Date of Birth/Sex: [birth year] (64 y.o. Male) Treating RN: Cornell Barman Primary Care Physician: PATIENT, NO Other Clinician: Referring Physician: Royetta Crochet Treating Physician/Extender: Frann Rider in Treatment: 7 Encounter Discharge Information Items Discharge Pain Level: 0 Discharge Condition: Stable Ambulatory  Status: Ambulatory Discharge Destination: Home Transportation: Private Auto Accompanied By: self Schedule Follow-up Appointment: Yes Medication Reconciliation completed and provided to Patient/Care No Tenecia Ignasiak: Provided on Clinical Summary of Care: 03/17/2015 Form Type Recipient Paper Patient WD Electronic Signature(s) Signed: 03/17/2015 5:27:16 PM By: Gretta Cool, RN, [name], [name], [name] Previous Signature: 03/17/2015 8:41:37 AM Version By: Ruthine Dose Entered By: Gretta Cool RN, [name], Kim on 03/17/2015 08:51:50 Mccully, [name] ([id number]) -------------------------------------------------------------------------------- Lower Extremity Assessment Details Patient Name: [name] Date of Service: 03/17/2015 8:00 AM Medical Record Number: [id number] Patient Account Number: [id number] Date of Birth/Sex: 05-28-1950 (64 y.o. Male) Treating RN: Cornell Barman Primary Care Physician: PATIENT, NO Other Clinician: Referring Physician: Royetta Crochet Treating Physician/Extender: Frann Rider in Treatment: 7 Edema Assessment Assessed: [Left: No] [Right: No] E[Left: dema] [Right: :] Calf Left: Right: Point of Measurement: 39 cm From Medial Instep cm 42.8 cm Ankle Left: Right: Point of Measurement: 12 cm From Medial Instep cm 30 cm Vascular Assessment Pulses: Posterior Tibial Dorsalis Pedis Palpable: [Right:Yes] Extremity colors, hair growth, and conditions: Extremity Color: [Right:Hyperpigmented] Hair Growth on Extremity: [Right:Yes] Temperature of Extremity: [Right:Warm] Capillary Refill: [Right:< 3 seconds] Toe Nail Assessment Left: Right: Thick: Yes Discolored: Yes Deformed: No Improper Length and Hygiene: No Electronic Signature(s) Signed: 03/17/2015 5:27:16 PM By: Gretta Cool, RN, [name], [name], [name] Entered By: Gretta Cool, RN, [name], Kim on 03/17/2015 08:19:59 [place], [name] ([id number]) -------------------------------------------------------------------------------- Multi  Wound Chart Details Patient Name: [name] Date of Service: 03/17/2015 8:00 AM Medical Record Number: [id number] Patient Account Number: [id number] Date of Birth/Sex: [birth year] (64 y.o. Male) Treating RN: Cornell Barman Primary Care Physician: PATIENT, NO Other Clinician: Referring Physician: Royetta Crochet Treating Physician/Extender: Frann Rider in Treatment: 7 Vital Signs Height(in): 74 Pulse(bpm): 58 Weight(lbs): 238 Blood Pressure 130/88 (mmHg): Body Mass Index(BMI): 31 Temperature(F): 97.7 Respiratory Rate 18 (breaths/min): Photos: [1:No Photos] [N/A:N/A] Wound Location: [1:Right Lower Leg - Medial, N/A Distal] Wounding Event: [1:Gradually Appeared] [N/A:N/A] Primary Etiology: [1:Venous Leg Ulcer] [N/A:N/A] Comorbid History: [1:Arrhythmia, Hypertension, N/A Gout, Neuropathy] Date Acquired: [  1:01/06/2015] [N/A:N/A] Weeks of Treatment: [1:7] [N/A:N/A] Wound Status: [1:Open] [N/A:N/A] Measurements L x W x D 5x2x0.2 [N/A:N/A] (cm) Area (cm) : [1:7.854] [N/A:N/A] Volume (cm) : [1:1.571] [N/A:N/A] % Reduction in Area: [1:5.50%] [N/A:N/A] % Reduction in Volume: 52.70% [N/A:N/A] Classification: [1:Full Thickness Without Exposed Support Structures] [N/A:N/A] Exudate Amount: [1:Medium] [N/A:N/A] Exudate Type: [1:Serosanguineous] [N/A:N/A] Exudate Color: [1:red, brown] [N/A:N/A] Wound Margin: [1:Flat and Intact] [N/A:N/A] Granulation Amount: [1:Medium (34-66%)] [N/A:N/A] Granulation Quality: [1:Red, Pink] [N/A:N/A] Necrotic Amount: [1:Medium (34-66%)] [N/A:N/A] Exposed Structures: [1:Fascia: No Fat: No Tendon: No Muscle: No] [N/A:N/A] Joint: No Bone: No Limited to Skin Breakdown Epithelialization: Small (1-33%) N/A N/A Periwound Skin Texture: Edema: No N/A N/A Excoriation: No Induration: No Callus: No Crepitus: No Fluctuance: No Friable: No Rash: No Scarring: No Periwound Skin Moist: Yes N/A N/A Moisture: Maceration: No Dry/Scaly:  No Periwound Skin Color: Atrophie Blanche: No N/A N/A Cyanosis: No Ecchymosis: No Erythema: No Hemosiderin Staining: No Mottled: No Pallor: No Rubor: No Temperature: No Abnormality N/A N/A Tenderness on Yes N/A N/A Palpation: Wound Preparation: Ulcer Cleansing: N/A N/A Rinsed/Irrigated with Saline, Other: antibacterial soap and water Topical Anesthetic Applied: Other: lidocaine 4% Treatment Notes Electronic Signature(s) Signed: 03/17/2015 5:27:16 PM By: Gretta Cool, RN, [name], [name], [name] Entered By: Gretta Cool, RN, [name], Kim on 03/17/2015 08:21:45 [place], [name] ([id number]) -------------------------------------------------------------------------------- [place] Details Patient Name: [name] Date of Service: 03/17/2015 8:00 AM Medical Record Number: [id number] Patient Account Number: [id number] Date of Birth/Sex: [birth year] (64 y.o. Male) Treating RN: Cornell Barman Primary Care Physician: PATIENT, NO Other Clinician: Referring Physician: Royetta Crochet Treating Physician/Extender: Frann Rider in Treatment: 7 Active Inactive Abuse / Safety / Falls / Self Care Management Nursing Diagnoses: Impaired physical mobility Potential for falls Goals: Patient will remain injury free Date Initiated: 01/27/2015 Goal Status: Active Interventions: Assess fall risk on admission and as needed Notes: Orientation to the Wound Care Program Nursing Diagnoses: Knowledge deficit related to the wound healing center program Goals: Patient/caregiver will verbalize understanding of the [place] Program Date Initiated: 01/27/2015 Goal Status: Active Interventions: Provide education on orientation to the wound center Notes: Venous Leg Ulcer Nursing Diagnoses: Potential for venous Insuffiency (use before diagnosis confirmed) Goals: Non-invasive venous studies are completed as ordered MONICA, ZAHLER ([id number]) Date Initiated: 01/27/2015 Goal  Status: Active Patient will maintain optimal edema control Date Initiated: 01/27/2015 Goal Status: Active Interventions: Assess peripheral edema status every visit. Compression as ordered Notes: Wound/Skin Impairment Nursing Diagnoses: Impaired tissue integrity Goals: Ulcer/skin breakdown will have a volume reduction of 30% by week 4 Date Initiated: 01/27/2015 Goal Status: Active Ulcer/skin breakdown will have a volume reduction of 50% by week 8 Date Initiated: 01/27/2015 Goal Status: Active Ulcer/skin breakdown will have a volume reduction of 80% by week 12 Date Initiated: 01/27/2015 Goal Status: Active Ulcer/skin breakdown will heal within 14 weeks Date Initiated: 01/27/2015 Goal Status: Active Interventions: Assess ulceration(s) every visit Notes: Electronic Signature(s) Signed: 03/17/2015 5:27:16 PM By: Gretta Cool, RN, [name], [name], [name] Entered By: Gretta Cool, RN, [name], Kim on 03/17/2015 08:21:38 [place], [name] ([id number]) -------------------------------------------------------------------------------- Pain Assessment Details Patient Name: [name] Date of Service: 03/17/2015 8:00 AM Medical Record Number: [id number] Patient Account Number: [id number] Date of Birth/Sex: [birth year] (64 y.o. Male) Treating RN: Cornell Barman Primary Care Physician: PATIENT, NO Other Clinician: Referring Physician: Royetta Crochet Treating Physician/Extender: Frann Rider in Treatment: 7 Active Problems Location of Pain Severity and Description of Pain Patient Has Paino No Site Locations Pain Management and Medication Current Pain Management: Electronic Signature(s) Signed:  03/17/2015 5:27:16 PM By: Gretta Cool, RN, [name], [name], [name] Entered By: Gretta Cool, RN, [name], Kim on 03/17/2015 08:13:09 Dull, [name] ([id number]) -------------------------------------------------------------------------------- Patient/Caregiver Education Details Patient Name: [name] Date of Service:  03/17/2015 8:00 AM Medical Record Number: [id number] Patient Account Number: [id number] Date of Birth/Gender: September 13, 1950 (64 y.o. Male) Treating RN: Cornell Barman Primary Care Physician: PATIENT, NO Other Clinician: Referring Physician: Royetta Crochet Treating Physician/Extender: Frann Rider in Treatment: 7 Education Assessment Education Provided To: Patient Education Topics Provided Wound/Skin Impairment: Handouts: Caring for Your Ulcer, Other: continue wound care as prescribed, do not get wrap wet Methods: Demonstration, Explain/Verbal Responses: State content correctly Electronic Signature(s) Signed: 03/17/2015 5:27:16 PM By: Gretta Cool, RN, [name], [name], [name] Entered By: Gretta Cool, RN, [name], Kim on 03/17/2015 08:52:20 Crites, [name] ([id number]) -------------------------------------------------------------------------------- Wound Assessment Details Patient Name: [name] Date of Service: 03/17/2015 8:00 AM Medical Record Number: [id number] Patient Account Number: [id number] Date of Birth/Sex: 29-Jan-1951 (64 y.o. Male) Treating RN: Cornell Barman Primary Care Physician: PATIENT, NO Other Clinician: Referring Physician: Royetta Crochet Treating Physician/Extender: Frann Rider in Treatment: 7 Wound Status Wound Number: 1 Primary Venous Leg Ulcer Etiology: Wound Location: Right Lower Leg - Medial, Distal Wound Status: Open Wounding Event: Gradually Appeared Comorbid Arrhythmia, Hypertension, Gout, History: Neuropathy Date Acquired: 01/06/2015 Weeks Of Treatment: 7 Clustered Wound: No Photos Photo Uploaded By: Gretta Cool, RN, [name], Kim on 03/17/2015 10:38:22 Wound Measurements Length: (cm) 5 % Reduction in Ar Width: (cm) 2 % Reduction in Vo Depth: (cm) 0.2 Epithelialization Area: (cm) 7.854 Volume: (cm) 1.571 ea: 5.5% lume: 52.7% : Small (1-33%) Wound Description Full Thickness Without Exposed Foul Odor After Classification: Support Structures Wound Margin:  Flat and Intact Medium Melchor, Joseff ([id number]) Cleansing: No Exudate Amount: Exudate Type: Serosanguineous Exudate Color: red, brown Wound Bed Granulation Amount: Medium (34-66%) Exposed Structure Granulation Quality: Red, Pink Fascia Exposed: No Necrotic Amount: Medium (34-66%) Fat Layer Exposed: No Necrotic Quality: Adherent Slough Tendon Exposed: No Muscle Exposed: No Joint Exposed: No Bone Exposed: No Limited to Skin Breakdown Periwound Skin Texture Texture Color No Abnormalities Noted: No No Abnormalities Noted: No Callus: No Atrophie Blanche: No Crepitus: No Cyanosis: No Excoriation: No Ecchymosis: No Fluctuance: No Erythema: No Friable: No Hemosiderin Staining: No Induration: No Mottled: No Localized Edema: No Pallor: No Rash: No Rubor: No Scarring: No Temperature / Pain Moisture Temperature: No Abnormality No Abnormalities Noted: No Tenderness on Palpation: Yes Dry / Scaly: No Maceration: No Moist: Yes Wound Preparation Ulcer Cleansing: Rinsed/Irrigated with Saline, Other: antibacterial soap and water, Topical Anesthetic Applied: Other: lidocaine 4%, Treatment Notes Wound #1 (Right, Distal, Medial Lower Leg) 1. Cleansed with: Clean wound with Normal Saline Cleanse wound with antibacterial soap and water 2. Anesthetic Topical Lidocaine 4% cream to wound bed prior to debridement 4. Dressing Applied: Other dressing (specify in notes) Lehnen, Elmore ([id number]) 5. Secondary Dressing Applied ABD Pad Dry Gauze 7. Secured with 4-Layer Compression System - Right Lower Extremity Notes Sorbact applied directly to wound Electronic Signature(s) Signed: 03/17/2015 5:27:16 PM By: Gretta Cool, RN, [name], [name], [name] Entered By: Gretta Cool, RN, [name], Kim on 03/17/2015 08:21:31 [place], [name] ([id number]) -------------------------------------------------------------------------------- Vitals Details Patient Name: [name] Date of  Service: 03/17/2015 8:00 AM Medical Record Number: [id number] Patient Account Number: [id number] Date of Birth/Sex: 11/27/1950 (64 y.o. Male) Treating RN: Cornell Barman Primary Care Physician: PATIENT, NO Other Clinician: Referring Physician: Royetta Crochet Treating Physician/Extender: Frann Rider in Treatment: 7 Vital Signs Time Taken: 08:11 Temperature (F): 97.7 Height (in): 74 Pulse (bpm): 58  Weight (lbs): 238 Respiratory Rate (breaths/min): 18 Body Mass Index (BMI): 30.6 Blood Pressure (mmHg): 130/88 Reference Range: 80 - 120 mg / dl Electronic Signature(s) Signed: 03/17/2015 5:27:16 PM By: Gretta Cool, RN, [name], [name], [name] Entered By: Gretta Cool, RN, [name], Kim on 03/17/2015 08:13:25

## 2015-03-24 ENCOUNTER — Encounter: Payer: Medicare Other | Attending: Surgery | Admitting: Surgery

## 2015-03-24 DIAGNOSIS — F17209 Nicotine dependence, unspecified, with unspecified nicotine-induced disorders: Secondary | ICD-10-CM | POA: Diagnosis not present

## 2015-03-24 DIAGNOSIS — Z8673 Personal history of transient ischemic attack (TIA), and cerebral infarction without residual deficits: Secondary | ICD-10-CM | POA: Insufficient documentation

## 2015-03-24 DIAGNOSIS — I83213 Varicose veins of right lower extremity with both ulcer of ankle and inflammation: Secondary | ICD-10-CM | POA: Diagnosis not present

## 2015-03-24 DIAGNOSIS — I83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: Secondary | ICD-10-CM | POA: Diagnosis not present

## 2015-03-24 DIAGNOSIS — I10 Essential (primary) hypertension: Secondary | ICD-10-CM | POA: Diagnosis not present

## 2015-03-24 DIAGNOSIS — M109 Gout, unspecified: Secondary | ICD-10-CM | POA: Diagnosis not present

## 2015-03-24 DIAGNOSIS — I4891 Unspecified atrial fibrillation: Secondary | ICD-10-CM | POA: Diagnosis not present

## 2015-03-24 DIAGNOSIS — L97312 Non-pressure chronic ulcer of right ankle with fat layer exposed: Secondary | ICD-10-CM | POA: Diagnosis not present

## 2015-03-25 NOTE — Progress Notes (Signed)
[name] ([id number]) Visit Report for 03/24/2015 Arrival Information Details Patient Name: [name], [name] Date of Service: 03/24/2015 8:00 AM Medical Record Number: [id number] Patient Account Number: [id number] Date of Birth/Sex: 1951/01/23 (64 y.o. Male) Treating RN: [name] Primary Care Physician: PATIENT, NO Other Clinician: Referring Physician: Royetta Crochet Treating Physician/Extender: Frann Rider in Treatment: 8 Visit Information History Since Last Visit Added or deleted any medications: No Patient Arrived: Ambulatory Any new allergies or adverse reactions: No Arrival Time: 08:12 Had a fall or experienced change in No Accompanied By: self activities of daily living that may affect Transfer Assistance: None risk of falls: Patient Identification Verified: Yes Signs or symptoms of abuse/neglect since last No Secondary Verification Process Yes visito Completed: Hospitalized since last visit: No Patient Has Alerts: Yes Has Dressing in Place as Prescribed: Yes Patient Alerts: Patient on Blood Has Compression in Place as Prescribed: Yes Thinner Pain Present Now: No warfarin ABI L 1.22 R 1.15 Electronic Signature(s) Signed: 03/24/2015 4:42:36 PM By: [name] Entered By: Mark Cool, RN, [name], Kim on 03/24/2015 08:12:27 [name] -------------------------------------------------------------------------------- Encounter Discharge Information Details Patient Name: [name] Date of Service: 03/24/2015 8:00 AM Medical Record Number: [id number] Patient Account Number: [id number] Date of Birth/Sex: [birth year] (64 y.o. Male) Treating RN: [name] Primary Care Physician: PATIENT, NO Other Clinician: Referring Physician: Royetta Crochet Treating Physician/Extender: Frann Rider in Treatment: 8 Encounter Discharge Information Items Discharge Pain Level: 0 Discharge Condition: Stable Ambulatory Status:  Ambulatory Discharge Destination: Home Transportation: Private Auto Accompanied By: self Schedule Follow-up Appointment: Yes Medication Reconciliation completed and provided to Patient/Care Yes [name]: Provided on Clinical Summary of Care: 03/24/2015 Form Type Recipient Paper Patient WD Electronic Signature(s) Signed: 03/24/2015 8:40:37 AM By: [name] Entered By: [name] on 03/24/2015 08:40:37 [name], [name] ([id number]) -------------------------------------------------------------------------------- Lower Extremity Assessment Details Patient Name: [name] Date of Service: 03/24/2015 8:00 AM Medical Record Number: [id number] Patient Account Number: [id number] Date of Birth/Sex: Jul 21, 1950 (64 y.o. Male) Treating RN: [name] Primary Care Physician: PATIENT, NO Other Clinician: Referring Physician: Royetta Crochet Treating Physician/Extender: Frann Rider in Treatment: 8 Edema Assessment Assessed: [Left: No] [Right: No] E[Left: dema] [Right: :] Calf Left: Right: Point of Measurement: 39 cm From Medial Instep cm 36 cm Ankle Left: Right: Point of Measurement: 12 cm From Medial Instep cm 29 cm Vascular Assessment Pulses: Posterior Tibial Dorsalis Pedis Palpable: [Right:Yes] Extremity colors, hair growth, and conditions: Extremity Color: [Right:Hyperpigmented] Hair Growth on Extremity: [Right:Yes] Temperature of Extremity: [Right:Warm] Capillary Refill: [Right:< 3 seconds] Toe Nail Assessment Left: Right: Thick: Yes Discolored: Yes Deformed: No Improper Length and Hygiene: No Electronic Signature(s) Signed: 03/24/2015 4:42:36 PM By: [name] Entered By: Mark Cool, RN, [name], Kim on 03/24/2015 08:20:26 [name], [name] ([id number]) -------------------------------------------------------------------------------- Multi Wound Chart Details Patient Name: [name] Date of Service: 03/24/2015 8:00 AM Medical  Record Number: [id number] Patient Account Number: [id number] Date of Birth/Sex: [birth year] (64 y.o. Male) Treating RN: [name] Primary Care Physician: PATIENT, NO Other Clinician: Referring Physician: Royetta Crochet Treating Physician/Extender: Frann Rider in Treatment: 8 Vital Signs Height(in): 74 Pulse(bpm): 56 Weight(lbs): 238 Blood Pressure 135/91 (mmHg): Body Mass Index(BMI): 31 Temperature(F): 97.5 Respiratory Rate 18 (breaths/min): Photos: [1:No Photos] [N/A:N/A] Wound Location: [1:Right Lower Leg - Medial, N/A Distal] Wounding Event: [1:Gradually Appeared] [N/A:N/A] Primary Etiology: [1:Venous Leg Ulcer] [N/A:N/A] Comorbid History: [1:Arrhythmia, Hypertension, N/A Gout, Neuropathy] Date Acquired: [1:01/06/2015] [N/A:N/A] Weeks of Treatment: [1:8] [N/A:N/A] Wound Status: [1:Open] [N/A:N/A] Measurements L x W  x D 4x0.6x0.2 [N/A:N/A] (cm) Area (cm) : [1:1.885] [N/A:N/A] Volume (cm) : [1:0.377] [N/A:N/A] % Reduction in Area: [1:77.30%] [N/A:N/A] % Reduction in Volume: 88.70% [N/A:N/A] Classification: [1:Full Thickness Without Exposed Support Structures] [N/A:N/A] Exudate Amount: [1:Medium] [N/A:N/A] Exudate Type: [1:Serosanguineous] [N/A:N/A] Exudate Color: [1:red, brown] [N/A:N/A] Wound Margin: [1:Flat and Intact] [N/A:N/A] Granulation Amount: [1:Large (67-100%)] [N/A:N/A] Granulation Quality: [1:Red, Pink] [N/A:N/A] Necrotic Amount: [1:Small (1-33%)] [N/A:N/A] Exposed Structures: [1:Fascia: No Fat: No Tendon: No Muscle: No] [N/A:N/A] Joint: No Bone: No Limited to Skin Breakdown Epithelialization: Small (1-33%) N/A N/A Periwound Skin Texture: Edema: No N/A N/A Excoriation: No Induration: No Callus: No Crepitus: No Fluctuance: No Friable: No Rash: No Scarring: No Periwound Skin Moist: Yes N/A N/A Moisture: Maceration: No Dry/Scaly: No Periwound Skin Color: Atrophie Blanche: No N/A N/A Cyanosis: No Ecchymosis: No Erythema:  No Hemosiderin Staining: No Mottled: No Pallor: No Rubor: No Temperature: No Abnormality N/A N/A Tenderness on Yes N/A N/A Palpation: Wound Preparation: Ulcer Cleansing: N/A N/A Rinsed/Irrigated with Saline, Other: antibacterial soap and water Topical Anesthetic Applied: Other: lidocaine 4% Treatment Notes Electronic Signature(s) Signed: 03/24/2015 4:42:36 PM By: [name] Entered By: Mark Cool, RN, [name], Kim on 03/24/2015 08:25:28 [name], [name] ([id number]) -------------------------------------------------------------------------------- Multi-Disciplinary Care Plan Details Patient Name: [name] Date of Service: 03/24/2015 8:00 AM Medical Record Number: [id number] Patient Account Number: [id number] Date of Birth/Sex: 05-Jul-1950 (64 y.o. Male) Treating RN: [name] Primary Care Physician: PATIENT, NO Other Clinician: Referring Physician: Royetta Crochet Treating Physician/Extender: Frann Rider in Treatment: 8 Active Inactive Abuse / Safety / Falls / Self Care Management Nursing Diagnoses: Impaired physical mobility Potential for falls Goals: Patient will remain injury free Date Initiated: 01/27/2015 Goal Status: Active Interventions: Assess fall risk on admission and as needed Notes: Orientation to the Wound Care Program Nursing Diagnoses: Knowledge deficit related to the wound healing center program Goals: Patient/caregiver will verbalize understanding of the [place] [facility] Date Initiated: 01/27/2015 Goal Status: Active Interventions: Provide education on orientation to the wound center Notes: Venous Leg Ulcer Nursing Diagnoses: Potential for venous Insuffiency (use before diagnosis confirmed) Goals: Non-invasive venous studies are completed as ordered [name], [name] ([id number]) Date Initiated: 01/27/2015 Goal Status: Active Patient will maintain optimal edema control Date Initiated: 01/27/2015 Goal  Status: Active Interventions: Assess peripheral edema status every visit. Compression as ordered Notes: Wound/Skin Impairment Nursing Diagnoses: Impaired tissue integrity Goals: Ulcer/skin breakdown will have a volume reduction of 30% by week 4 Date Initiated: 01/27/2015 Goal Status: Active Ulcer/skin breakdown will have a volume reduction of 50% by week 8 Date Initiated: 01/27/2015 Goal Status: Active Ulcer/skin breakdown will have a volume reduction of 80% by week 12 Date Initiated: 01/27/2015 Goal Status: Active Ulcer/skin breakdown will heal within 14 weeks Date Initiated: 01/27/2015 Goal Status: Active Interventions: Assess ulceration(s) every visit Notes: Electronic Signature(s) Signed: 03/24/2015 4:42:36 PM By: [name] Entered By: Mark Cool, RN, [name], Kim on 03/24/2015 08:25:22 [name], [name] ([id number]) -------------------------------------------------------------------------------- Pain Assessment Details Patient Name: [name] Date of Service: 03/24/2015 8:00 AM Medical Record Number: [id number] Patient Account Number: [id number] Date of Birth/Sex: 09-Jun-1950 (64 y.o. Male) Treating RN: [name] Primary Care Physician: PATIENT, NO Other Clinician: Referring Physician: Royetta Crochet Treating Physician/Extender: Frann Rider in Treatment: 8 Active Problems Location of Pain Severity and Description of Pain Patient Has Paino No Site Locations Pain Management and Medication Current Pain Management: Electronic Signature(s) Signed: 03/24/2015 4:42:36 PM By: [name] Entered By: Mark Cool, RN, [name],  Kim on 03/24/2015 08:12:56 [name], [name] ([id number]) -------------------------------------------------------------------------------- Patient/Caregiver Education Details Patient Name: [name] Date of Service: 03/24/2015 8:00 AM Medical Record Number: [id number] Patient Account Number: [id number] Date of  Birth/Gender: 1950-07-13 (64 y.o. Male) Treating RN: [name] Primary Care Physician: PATIENT, NO Other Clinician: Referring Physician: Royetta Crochet Treating Physician/Extender: Frann Rider in Treatment: 8 Education Assessment Education Provided To: Patient Education Topics Provided Wound/Skin Impairment: Handouts: Caring for Your Ulcer, Other: continue wound care as prescribed Methods: Demonstration Responses: State content correctly Electronic Signature(s) Signed: 03/24/2015 4:42:36 PM By: [name] Entered By: Mark Cool, RN, [name], Kim on 03/24/2015 08:38:53 [name], [name] ([id number]) -------------------------------------------------------------------------------- Wound Assessment Details Patient Name: [name] Date of Service: 03/24/2015 8:00 AM Medical Record Number: [id number] Patient Account Number: [id number] Date of Birth/Sex: 05-Dec-1950 (64 y.o. Male) Treating RN: [name] Primary Care Physician: PATIENT, NO Other Clinician: Referring Physician: Royetta Crochet Treating Physician/Extender: Frann Rider in Treatment: 8 Wound Status Wound Number: 1 Primary Venous Leg Ulcer Etiology: Wound Location: Right Lower Leg - Medial, Distal Wound Status: Open Wounding Event: Gradually Appeared Comorbid Arrhythmia, Hypertension, Gout, History: Neuropathy Date Acquired: 01/06/2015 Weeks Of Treatment: 8 Clustered Wound: No Photos Photo Uploaded By: Mark Cool, RN, [name], Kim on 03/24/2015 11:56:13 Wound Measurements Length: (cm) 4 % Reduction in Ar Width: (cm) 0.6 % Reduction in Vo Depth: (cm) 0.2 Epithelialization Area: (cm) 1.885 Volume: (cm) 0.377 ea: 77.3% lume: 88.7% : Small (1-33%) Wound Description Full Thickness Without Exposed Foul Odor After Classification: Support Structures Wound Margin: Flat and Intact Exudate Medium Amount: Exudate Type: Serosanguineous Exudate Color: red, brown Cleansing: No Wound  Bed Granulation Amount: Large (67-100%) Exposed Structure Granulation Quality: Red, Pink Fascia Exposed: No [name], [name] ([id number]) Necrotic Amount: Small (1-33%) Fat Layer Exposed: No Necrotic Quality: Adherent Slough Tendon Exposed: No Muscle Exposed: No Joint Exposed: No Bone Exposed: No Limited to Skin Breakdown Periwound Skin Texture Texture Color No Abnormalities Noted: No No Abnormalities Noted: No Callus: No Atrophie Blanche: No Crepitus: No Cyanosis: No Excoriation: No Ecchymosis: No Fluctuance: No Erythema: No Friable: No Hemosiderin Staining: No Induration: No Mottled: No Localized Edema: No Pallor: No Rash: No Rubor: No Scarring: No Temperature / Pain Moisture Temperature: No Abnormality No Abnormalities Noted: No Tenderness on Palpation: Yes Dry / Scaly: No Maceration: No Moist: Yes Wound Preparation Ulcer Cleansing: Rinsed/Irrigated with Saline, Other: antibacterial soap and water, Topical Anesthetic Applied: Other: lidocaine 4%, Treatment Notes Wound #1 (Right, Distal, Medial Lower Leg) 1. Cleansed with: Clean wound with Normal Saline Cleanse wound with antibacterial soap and water 2. Anesthetic Topical Lidocaine 4% cream to wound bed prior to debridement 4. Dressing Applied: Other dressing (specify in notes) 5. Secondary Dressing Applied ABD Pad 7. Secured with 4-Layer Compression System - Right Lower Extremity Notes Sorbact applied directly to wound, hydrogel on top Electronic Signature(s) SALIH, [name] ([id number]) Signed: 03/24/2015 4:42:36 PM By: [name] Entered By: Mark Cool, RN, [name], Kim on 03/24/2015 08:21:29 [name], [name] ([id number]) -------------------------------------------------------------------------------- [place] Details Patient Name: [name] Date of Service: 03/24/2015 8:00 AM Medical Record Number: [id number] Patient Account Number: [id number] Date of Birth/Sex: 1950-06-05  (64 y.o. Male) Treating RN: [name] Primary Care Physician: PATIENT, NO Other Clinician: Referring Physician: Royetta Crochet Treating Physician/Extender: Frann Rider in Treatment: 8 Vital Signs Time Taken: 08:14 Temperature (F): 97.5 Height (in): 74 Pulse (bpm): 56 Weight (lbs): 238 Respiratory Rate (breaths/min): 18 Body Mass Index (BMI): 30.6 Blood Pressure (mmHg): 135/91 Reference Range: 80 -  120 mg / dl Electronic Signature(s) Signed: 03/24/2015 4:42:36 PM By: [name] Entered By: Mark Cool, RN, [name], Kim on 03/24/2015 08:15:19

## 2015-03-25 NOTE — Progress Notes (Signed)
[name], [name] ([id number]) Visit Report for 03/24/2015 Chief Complaint Document Details Patient Name: [name], [name] Date of Service: 03/24/2015 8:00 AM Medical Record Number: [id number] Patient Account Number: [id number] Date of Birth/Sex: 08-31-1950 (64 [name].o. Male) Treating RN: Cornell Barman Primary Care Physician: PATIENT, NO Other Clinician: Referring Physician: Royetta Crochet Treating Physician/Extender: Frann Rider in Treatment: 8 Information Obtained from: Patient Chief Complaint Patient presents for treatment of an open ulcer due to venous insufficiency. He says he noticed a "into the right medial ankle for about 3-4 weeks Electronic Signature(s) Signed: 03/24/2015 8:40:23 AM By: Christin Fudge MD, FACS Entered By: Christin Fudge on 03/24/2015 08:40:23 Castile, [name] ([id number]) -------------------------------------------------------------------------------- HPI Details Patient Name: [name] Date of Service: 03/24/2015 8:00 AM Medical Record Number: [id number] Patient Account Number: [id number] Date of Birth/Sex: 09/10/1950 (64 [name].o. Male) Treating RN: Cornell Barman Primary Care Physician: PATIENT, NO Other Clinician: Referring Physician: Royetta Crochet Treating Physician/Extender: Frann Rider in Treatment: 8 History of Present Illness Location: open wound just above his right ankle Quality: Patient reports experiencing a dull pain to affected area(s). Severity: Patient states wound are getting worse. Duration: Patient has had the wound for < 4 weeks prior to presenting for treatment Timing: Pain in wound is Intermittent (comes and goes Context: The wound appeared gradually over time Modifying Factors: Consults to this date include: he was seen in the ER and was referred to a vascular surgeon but the patient has not done that. He may have been treated with clindamycin in the ER. Associated Signs and Symptoms: Patient reports having difficulty  standing for long periods. HPI Description: 64 year old gentleman who was seen in the emergency department recently on 01/06/2015 for a wound of his right lower extremity which he says was not involving any injury and he did not know how he sustained it. He had draining foul-smelling liquid from the area and had gone for care there. his past medical history is significant for DVT, hypertension, gout, tobacco abuse, cocaine abuse, stroke, atrial fibrillation, pulmonary embolism. he has also had some vascular surgery with a stent placed in his leg. He has been a smoker for many years and has given up straight drugs several years ago. He continues to smoke about 4-5 cigarettes a day. 02/03/2015 -- received a note from 05/14/2013 where Dr. Leotis Pain placed an inferior vena cava filter. The patient had a deep vein thrombosis while therapeutic on anticoagulation for previous DVT and a IVC filter was placed for this. 02/10/2015 -- he did have his vascular test done on Friday but we have no reports yet. 02/17/2015 -- notes were reviewed from the vascular office and the patient had a venous ultrasound done which revealed that he had no reflux in the greater saphenous vein or the short saphenous vein bilaterally. He did have subacute DVT in the common femoral vein and popliteal veins on the right and left side. The recommendation was to continue with Unna's boot therapy at the wound clinic and then to wear graduated compression stockings once the ulcers healed and later if he had continuous problems lymphedema pump would benefit him. 03/17/2015 -- we have applied for his insurance and aide regarding cellular tissue-based products and are still awaiting the final clearance. 11/21/2014 -- he has had Apligraf authorized for him but his wound is looking so good today that we may not use it. Electronic Signature(s) Signed: 03/24/2015 8:40:49 AM By: Christin Fudge MD, FACS Entered By: Christin Fudge on  03/24/2015 08:40:49 [name], [name] ([id number]) [name], [name] ([id number]) --------------------------------------------------------------------------------  Physical Exam Details Patient Name: [name], [name] Date of Service: 03/24/2015 8:00 AM Medical Record Number: [id number] Patient Account Number: [id number] Date of Birth/Sex: 1950/09/11 (64 [name].o. Male) Treating RN: Cornell Barman Primary Care Physician: PATIENT, NO Other Clinician: Referring Physician: Royetta Crochet Treating Physician/Extender: Frann Rider in Treatment: 8 Constitutional . Pulse regular. Respirations normal and unlabored. Afebrile. . Eyes Nonicteric. Reactive to light. Ears, Nose, Mouth, and Throat Lips, teeth, and gums WNL.[name] Moist mucosa without lesions . Neck supple and nontender. No palpable supraclavicular or cervical adenopathy. Normal sized without goiter. Respiratory WNL. No retractions.. Breath sounds WNL, No rubs, rales, rhonchi, or wheeze.. Cardiovascular Heart rhythm and rate regular, no murmur or gallop.. Pedal Pulses WNL. No clubbing, cyanosis or edema. Chest Breasts symmetical and no nipple discharge.. Breast tissue WNL, no masses, lumps, or tenderness.. Lymphatic No adneopathy. No adenopathy. No adenopathy. Musculoskeletal Adexa without tenderness or enlargement.. Digits and nails w/o clubbing, cyanosis, infection, petechiae, ischemia, or inflammatory conditions.. Integumentary (Hair, Skin) No suspicious lesions. No crepitus or fluctuance. No peri-wound warmth or erythema. No masses.[name] Psychiatric Judgement and insight Intact.. No evidence of depression, anxiety, or agitation.. Notes the wound is looking excellent smaller and some epithelialization and I do not believe he needs Apligraf. Electronic Signature(s) Signed: 03/24/2015 8:41:09 AM By: Christin Fudge MD, FACS Entered By: Christin Fudge on 03/24/2015 08:41:08 [name], [name]  ([id number]) -------------------------------------------------------------------------------- Physician Orders Details Patient Name: [name] Date of Service: 03/24/2015 8:00 AM Medical Record Number: [id number] Patient Account Number: [id number] Date of Birth/Sex: Jul 30, 1950 (64 [name].o. Male) Treating RN: Cornell Barman Primary Care Physician: PATIENT, NO Other Clinician: Referring Physician: Royetta Crochet Treating Physician/Extender: Frann Rider in Treatment: 8 Verbal / Phone Orders: Yes Clinician: Cornell Barman Read Back and Verified: Yes Diagnosis Coding Wound Cleansing Wound #1 Right,Distal,Medial Lower Leg o Clean wound with Normal Saline. o Cleanse wound with mild soap and water Anesthetic Wound #1 Right,Distal,Medial Lower Leg o Topical Lidocaine 4% cream applied to wound bed prior to debridement Primary Wound Dressing Wound #1 Right,Distal,Medial Lower Leg o Other: - Sorbact Secondary Dressing Wound #1 Right,Distal,Medial Lower Leg o ABD pad Dressing Change Frequency Wound #1 Right,Distal,Medial Lower Leg o Change dressing every week Follow-up Appointments Wound #1 Right,Distal,Medial Lower Leg o Return Appointment in 1 week. Edema Control o 4-Layer Compression System - Right Lower Extremity o Elevate legs to the level of the heart and pump ankles as often as possible - and when sitting Electronic Signature(s) Signed: 03/24/2015 4:02:36 PM By: Christin Fudge MD, FACS Signed: 03/24/2015 4:42:36 PM By: Gretta Cool RN, [name], [name], [name] Entered By: Gretta Cool, RN, [name], Kim on 03/24/2015 08:26:57 [name], [name] ([id number]) ANDRICK, RUST ([id number]) -------------------------------------------------------------------------------- Problem List Details Patient Name: [name] Date of Service: 03/24/2015 8:00 AM Medical Record Number: [id number] Patient Account Number: [id number] Date of Birth/Sex: 12/12/1950 (64 [name].o. 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/27/2015 Yes exposed F17.209 Nicotine dependence, unspecified, with unspecified 01/27/2015 Yes nicotine-induced disorders Inactive Problems Resolved Problems Electronic Signature(s) Signed: 03/24/2015 8:40:12 AM By: Christin Fudge MD, FACS Entered By: Christin Fudge on 03/24/2015 08:40:12 [name], [name] ([id number]) -------------------------------------------------------------------------------- Progress Note Details Patient  Name: [name], [name] Date of Service: 03/24/2015 8:00 AM Medical Record Number: [id number] Patient Account Number: [id number] Date of Birth/Sex: 09-12-1950 (64 [name].o. Male) Treating RN: Cornell Barman Primary Care Physician: PATIENT, NO Other Clinician: Referring Physician: Royetta Crochet Treating Physician/Extender: Frann Rider in Treatment: 8 Subjective Chief Complaint Information obtained from Patient Patient presents for treatment of an open ulcer due to venous insufficiency. He says he noticed a "into the right medial ankle for about 3-4 weeks History of Present Illness (HPI) The following HPI elements were documented for the patient's wound: Location: open wound just above his right ankle Quality: Patient reports experiencing a dull pain to affected area(s). Severity: Patient states wound are getting worse. Duration: Patient has had the wound for < 4 weeks prior to presenting for treatment Timing: Pain in wound is Intermittent (comes and goes Context: The  wound appeared gradually over time Modifying Factors: Consults to this date include: he was seen in the ER and was referred to a vascular surgeon but the patient has not done that. He may have been treated with clindamycin in the ER. Associated Signs and Symptoms: Patient reports having difficulty standing for long periods. 64 year old gentleman who was seen in the emergency department recently on 01/06/2015 for a wound of his right lower extremity which he says was not involving any injury and he did not know how he sustained it. He had draining foul-smelling liquid from the area and had gone for care there. his past medical history is significant for DVT, hypertension, gout, tobacco abuse, cocaine abuse, stroke, atrial fibrillation, pulmonary embolism. he has also had some vascular surgery with a stent placed in his leg. He has been a smoker for many years and has given up straight drugs several years ago. He continues to smoke about 4-5 cigarettes a day. 02/03/2015 -- received a note from 05/14/2013 where Dr. Leotis Pain placed an inferior vena cava filter. The patient had a deep vein thrombosis while therapeutic on anticoagulation for previous DVT and a IVC filter was placed for this. 02/10/2015 -- he did have his vascular test done on Friday but we have no reports yet. 02/17/2015 -- notes were reviewed from the vascular office and the patient had a venous ultrasound done which revealed that he had no reflux in the greater saphenous vein or the short saphenous vein bilaterally. He did have subacute DVT in the common femoral vein and popliteal veins on the right and left side. The recommendation was to continue with Unna's boot therapy at the wound clinic and then to wear graduated compression stockings once the ulcers healed and later if he had continuous problems lymphedema pump would benefit him. 03/17/2015 -- we have applied for his insurance and aide regarding cellular tissue-based  products and are still awaiting the final clearance. [name], [name] ([id number]) 11/21/2014 -- he has had Apligraf authorized for him but his wound is looking so good today that we may not use it. Objective Constitutional Pulse regular. Respirations normal and unlabored. Afebrile. Vitals Time Taken: 8:14 AM, Height: 74 in, Weight: 238 lbs, BMI: 30.6, Temperature: 97.5 F, Pulse: 56 bpm, Respiratory Rate: 18 breaths/min, Blood Pressure: 135/91 mmHg. Eyes Nonicteric. Reactive to light. Ears, Nose, Mouth, and Throat Lips, teeth, and gums WNL.[name] Moist mucosa without lesions . Neck supple and nontender. No palpable supraclavicular or cervical adenopathy. Normal sized without goiter. Respiratory WNL. No retractions.. Breath sounds WNL, No rubs, rales, rhonchi, or wheeze.. Cardiovascular Heart rhythm and rate regular, no murmur or gallop.. Pedal Pulses  WNL. No clubbing, cyanosis or edema. Chest Breasts symmetical and no nipple discharge.. Breast tissue WNL, no masses, lumps, or tenderness.. Lymphatic No adneopathy. No adenopathy. No adenopathy. Musculoskeletal Adexa without tenderness or enlargement.. Digits and nails w/o clubbing, cyanosis, infection, petechiae, ischemia, or inflammatory conditions.[name] Psychiatric Judgement and insight Intact.. No evidence of depression, anxiety, or agitation.. General Notes: the wound is looking excellent smaller and some epithelialization and I do not believe he needs Apligraf. Integumentary (Hair, Skin) [name], [name] ([id number]) No suspicious lesions. No crepitus or fluctuance. No peri-wound warmth or erythema. No masses.. Wound #1 status is Open. Original cause of wound was Gradually Appeared. The wound is located on the Right,Distal,Medial Lower Leg. The wound measures 4cm length x 0.6cm width x 0.2cm depth; 1.885cm^2 area and 0.377cm^3 volume. The wound is limited to skin breakdown. There is a medium amount of serosanguineous drainage  noted. The wound margin is flat and intact. There is large (67-100%) red, pink granulation within the wound bed. There is a small (1-33%) amount of necrotic tissue within the wound bed including Adherent Slough. The periwound skin appearance exhibited: Moist. The periwound skin appearance did not exhibit: Callus, Crepitus, Excoriation, Fluctuance, Friable, Induration, Localized Edema, Rash, Scarring, Dry/Scaly, Maceration, Atrophie Blanche, Cyanosis, Ecchymosis, Hemosiderin Staining, Mottled, Pallor, Rubor, Erythema. Periwound temperature was noted as No Abnormality. The periwound has tenderness on palpation. 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 Cleanse wound with mild soap and water Anesthetic: Wound #1 Right,Distal,Medial Lower Leg: Topical Lidocaine 4% cream applied to wound bed prior to debridement Primary Wound Dressing: Wound #1 Right,Distal,Medial Lower Leg: Other: - Sorbact Secondary Dressing: Wound #1 Right,Distal,Medial Lower Leg: ABD pad Dressing Change Frequency: Wound #1 Right,Distal,Medial Lower Leg: Change dressing every week Follow-up Appointments: [name], YAPP ([id number]) Wound #1 Right,Distal,Medial Lower Leg: Return Appointment in 1 week. Edema Control: 4-Layer Compression System - Right Lower Extremity Elevate legs to the level of the heart and pump ankles as often as possible - and when sitting I would continue with Sorbact and 4-layer compression wrap. We will not apply the cellular based tissue product ( Apligraf). We will also order  compression stockings for him in anticipation of his wound healing and continued compression. He will see [place] back next week. Electronic Signature(s) Signed: 03/24/2015 8:42:34 AM By: Christin Fudge MD, FACS Entered By: Christin Fudge on 03/24/2015 08:42:34 Yarbough, [name] ([id number]) -------------------------------------------------------------------------------- SuperBill Details Patient Name: [name] Date of Service: 03/24/2015 Medical Record Number: [id number] Patient Account Number: [id number] Date of Birth/Sex: 1950-09-19 (64 [name].o. 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 of ankle and inflammation L97.312 Non-pressure chronic ulcer of right ankle with fat layer exposed F17.209 Nicotine dependence, unspecified, with unspecified nicotine-induced disorders Facility Procedures CPT4: Description Modifier Quantity Code [id number] (Facility Use Only) [id number] - APPLY MULTLAY COMPRS LWR RT 1 LEG Physician Procedures CPT4: Description Modifier Quantity Code 4492010 07121 - WC PHYS LEVEL 3 - EST PT 1  ICD-10 Description Diagnosis 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

## 2015-03-31 ENCOUNTER — Encounter: Payer: Medicare Other | Admitting: Surgery

## 2015-03-31 DIAGNOSIS — I83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: Secondary | ICD-10-CM | POA: Diagnosis not present

## 2015-04-01 NOTE — Progress Notes (Signed)
BYREN, PANKOW ([id number]) Visit Report for 03/31/2015 Arrival Information Details Patient Name: [name], [name] Date of Service: 03/31/2015 8:00 AM Medical Record Number: [id number] Patient Account Number: [id number] Date of Birth/Sex: Feb 22, 1951 (64 y.o. Male) Treating RN: Cornell Barman Primary Care Physician: PATIENT, NO Other Clinician: Referring Physician: Royetta Crochet Treating Physician/Extender: Frann Rider in Treatment: 9 Visit Information History Since Last Visit Added or deleted any medications: No Patient Arrived: Ambulatory Any new allergies or adverse reactions: No Arrival Time: 08:03 Had a fall or experienced change in No Accompanied By: self activities of daily living that may affect Transfer Assistance: None risk of falls: Patient Identification Verified: Yes Signs or symptoms of abuse/neglect since last No Secondary Verification Process Yes visito Completed: Hospitalized since last visit: No Patient Has Alerts: Yes Has Dressing in Place as Prescribed: Yes Patient Alerts: Patient on Blood Has Compression in Place as Prescribed: Yes Thinner Pain Present Now: No warfarin ABI L 1.22 R 1.15 Electronic Signature(s) Signed: 03/31/2015 4:50:02 PM By: Gretta Cool, RN, [name], [name], [name] Entered By: Gretta Cool, RN, [name], Kim on 03/31/2015 08:03:36 [name], Mark Mark ([id number]) -------------------------------------------------------------------------------- Encounter Discharge Information Details Patient Name: [name] Date of Service: 03/31/2015 8:00 AM Medical Record Number: [id number] Patient Account Number: [id number] Date of Birth/Sex: Sep 28, 1950 (64 y.o. Male) Treating RN: Cornell Barman Primary Care Physician: PATIENT, NO Other Clinician: Referring Physician: Royetta Crochet Treating Physician/Extender: Frann Rider in Treatment: 9 Encounter Discharge Information Items Discharge Pain Level: 0 Discharge Condition: Stable Ambulatory  Status: Ambulatory Discharge Destination: Home Transportation: Private Auto Accompanied By: self Schedule Follow-up Appointment: Yes Medication Reconciliation completed and provided to Patient/Care Yes [name]: Provided on Clinical Summary of Care: 03/31/2015 Form Type Recipient Paper Patient WD Electronic Signature(s) Signed: 03/31/2015 8:32:44 AM By: Ruthine Dose Entered By: Ruthine Dose on 03/31/2015 08:32:44 [name], Mark Mark ([id number]) -------------------------------------------------------------------------------- Lower Extremity Assessment Details Patient Name: [name] Date of Service: 03/31/2015 8:00 AM Medical Record Number: [id number] Patient Account Number: [id number] Date of Birth/Sex: 11/16/1950 (64 y.o. Male) Treating RN: Cornell Barman Primary Care Physician: PATIENT, NO Other Clinician: Referring Physician: Royetta Crochet Treating Physician/Extender: Frann Rider in Treatment: 9 Edema Assessment Assessed: [Left: No] [Right: No] E[Left: dema] [Right: :] Calf Left: Right: Point of Measurement: 39 cm From Medial Instep cm 40 cm Ankle Left: Right: Point of Measurement: 12 cm From Medial Instep cm 28.8 cm Vascular Assessment Pulses: Posterior Tibial Dorsalis Pedis Palpable: [Right:Yes] Doppler: [Right:Multiphasic] Extremity colors, hair growth, and conditions: Extremity Color: [Right:Dusky] Hair Growth on Extremity: [Right:No] Temperature of Extremity: [Right:Warm] Capillary Refill: [Right:< 3 seconds] Toe Nail Assessment Left: Right: Thick: No Discolored: No Deformed: No Improper Length and Hygiene: No Electronic Signature(s) Signed: 03/31/2015 4:50:02 PM By: Gretta Cool, RN, [name], [name], [name] Entered By: Gretta Cool, RN, [name], Kim on 03/31/2015 08:05:44 [name], Mark Mark ([id number]) [name], Mark Mark ([id number]) -------------------------------------------------------------------------------- Multi Wound Chart Details Patient Name:  [name] Date of Service: 03/31/2015 8:00 AM Medical Record Number: [id number] Patient Account Number: [id number] Date of Birth/Sex: [birth year] (63 y.o. Male) Treating RN: Cornell Barman Primary Care Physician: PATIENT, NO Other Clinician: Referring Physician: Royetta Crochet Treating Physician/Extender: Frann Rider in Treatment: 9 Vital Signs Height(in): 74 Pulse(bpm): 50 Weight(lbs): 238 Blood Pressure 136/79 (mmHg): Body Mass Index(BMI): 31 Temperature(F): 97.4 Respiratory Rate 18 (breaths/min): Photos: [1:No Photos] [N/A:N/A] Wound Location: [1:Right Lower Leg - Medial, N/A Distal] Wounding Event: [1:Gradually Appeared] [N/A:N/A] Primary Etiology: [1:Venous Leg Ulcer] [N/A:N/A] Comorbid History: [1:Arrhythmia, Hypertension, N/A Gout, Neuropathy] Date Acquired: [1:01/06/2015] [N/A:N/A] Weeks of Treatment: [1:9] [N/A:N/A] Wound Status: [1:Open] [  N/A:N/A] Measurements L x W x D 0.1x0.1x0.1 [N/A:N/A] (cm) Area (cm) : [1:0.008] [N/A:N/A] Volume (cm) : [1:0.001] [N/A:N/A] % Reduction in Area: [1:99.90%] [N/A:N/A] % Reduction in Volume: 100.00% [N/A:N/A] Classification: [1:Full Thickness Without Exposed Support Structures] [N/A:N/A] Exudate Amount: [1:Medium] [N/A:N/A] Exudate Type: [1:Serosanguineous] [N/A:N/A] Exudate Color: [1:red, brown] [N/A:N/A] Wound Margin: [1:Flat and Intact] [N/A:N/A] Granulation Amount: [1:Large (67-100%)] [N/A:N/A] Granulation Quality: [1:Red, Pink] [N/A:N/A] Necrotic Amount: [1:Small (1-33%)] [N/A:N/A] Necrotic Tissue: [1:Eschar] [N/A:N/A] Exposed Structures: [1:Fascia: No Fat: No Tendon: No] [N/A:N/A] Muscle: No Joint: No Bone: No Limited to Skin Breakdown Epithelialization: Small (1-33%) N/A N/A Periwound Skin Texture: Edema: No N/A N/A Excoriation: No Induration: No Callus: No Crepitus: No Fluctuance: No Friable: No Rash: No Scarring: No Periwound Skin Moist: Yes N/A N/A Moisture: Maceration: No Dry/Scaly:  No Periwound Skin Color: Atrophie Blanche: No N/A N/A Cyanosis: No Ecchymosis: No Erythema: No Hemosiderin Staining: No Mottled: No Pallor: No Rubor: No Temperature: No Abnormality N/A N/A Tenderness on Yes N/A N/A Palpation: Wound Preparation: Ulcer Cleansing: N/A N/A Rinsed/Irrigated with Saline, Other: antibacterial soap and water Topical Anesthetic Applied: Other: lidocaine 4% Treatment Notes Electronic Signature(s) Signed: 03/31/2015 4:50:02 PM By: Gretta Cool, RN, [name], [name], [name] Entered By: Gretta Cool, RN, [name], Kim on 03/31/2015 08:17:15 [name], Mark Mark ([id number]) -------------------------------------------------------------------------------- Multi-Disciplinary Care Plan Details Patient Name: [name] Date of Service: 03/31/2015 8:00 AM Medical Record Number: [id number] Patient Account Number: [id number] Date of Birth/Sex: [birth year] (64 y.o. Male) Treating RN: Cornell Barman Primary Care Physician: PATIENT, NO Other Clinician: Referring Physician: Royetta Crochet Treating Physician/Extender: Frann Rider in Treatment: 9 Active Inactive Abuse / Safety / Falls / Self Care Management Nursing Diagnoses: Impaired physical mobility Potential for falls Goals: Patient will remain injury free Date Initiated: 01/27/2015 Goal Status: Active Interventions: Assess fall risk on admission and as needed Notes: Orientation to the Wound Care Program Nursing Diagnoses: Knowledge deficit related to the wound healing center program Goals: Patient/caregiver will verbalize understanding of the [facility] Date Initiated: 01/27/2015 Goal Status: Active Interventions: Provide education on orientation to the wound center Notes: Venous Leg Ulcer Nursing Diagnoses: Potential for venous Insuffiency (use before diagnosis confirmed) Goals: Non-invasive venous studies are completed as ordered PRIEST, LOCKRIDGE ([id number]) Date Initiated: 01/27/2015 Goal  Status: Active Patient will maintain optimal edema control Date Initiated: 01/27/2015 Goal Status: Active Interventions: Assess peripheral edema status every visit. Compression as ordered Notes: Wound/Skin Impairment Nursing Diagnoses: Impaired tissue integrity Goals: Ulcer/skin breakdown will have a volume reduction of 30% by week 4 Date Initiated: 01/27/2015 Goal Status: Active Ulcer/skin breakdown will have a volume reduction of 50% by week 8 Date Initiated: 01/27/2015 Goal Status: Active Ulcer/skin breakdown will have a volume reduction of 80% by week 12 Date Initiated: 01/27/2015 Goal Status: Active Ulcer/skin breakdown will heal within 14 weeks Date Initiated: 01/27/2015 Goal Status: Active Interventions: Assess ulceration(s) every visit Notes: Electronic Signature(s) Signed: 03/31/2015 4:50:02 PM By: Gretta Cool, RN, [name], [name], [name] Entered By: Gretta Cool, RN, [name], Kim on 03/31/2015 08:17:09 [name], Mark Mark ([id number]) -------------------------------------------------------------------------------- Pain Assessment Details Patient Name: [name] Date of Service: 03/31/2015 8:00 AM Medical Record Number: [id number] Patient Account Number: [id number] Date of Birth/Sex: Jul 21, 1950 (64 y.o. Male) Treating RN: Cornell Barman Primary Care Physician: PATIENT, NO Other Clinician: Referring Physician: Royetta Crochet Treating Physician/Extender: Frann Rider in Treatment: 9 Active Problems Location of Pain Severity and Description of Pain Patient Has Paino No Site Locations Pain Management and Medication Current Pain Management: Electronic Signature(s) Signed: 03/31/2015 4:50:02 PM By: Gretta Cool, RN,  [name], Leisure centre manager, [name] Entered By: Gretta Cool, RN, [name], Kim on 03/31/2015 08:03:42 [name], Mark Mark ([id number]) -------------------------------------------------------------------------------- Patient/Caregiver Education Details Patient Name: [name] Date of Service:  03/31/2015 8:00 AM Medical Record Number: [id number] Patient Account Number: [id number] Date of Birth/Gender: July 19, 1950 (64 y.o. Male) Treating RN: Cornell Barman Primary Care Physician: PATIENT, NO Other Clinician: Referring Physician: Royetta Crochet Treating Physician/Extender: Frann Rider in Treatment: 9 Education Assessment Education Provided To: Patient Education Topics Provided Wound/Skin Impairment: Handouts: Caring for Your Ulcer, Other: bring stocking to next visit Methods: Demonstration, Explain/Verbal Responses: State content correctly Electronic Signature(s) Signed: 03/31/2015 4:50:02 PM By: Gretta Cool, RN, [name], [name], [name] Entered By: Gretta Cool, RN, [name], Kim on 03/31/2015 08:19:43 [name], Mark Mark ([id number]) -------------------------------------------------------------------------------- Wound Assessment Details Patient Name: [name] Date of Service: 03/31/2015 8:00 AM Medical Record Number: [id number] Patient Account Number: [id number] Date of Birth/Sex: [birth year] (64 y.o. Male) Treating RN: Cornell Barman Primary Care Physician: PATIENT, NO Other Clinician: Referring Physician: Royetta Crochet Treating Physician/Extender: Frann Rider in Treatment: 9 Wound Status Wound Number: 1 Primary Venous Leg Ulcer Etiology: Wound Location: Right Lower Leg - Medial, Distal Wound Status: Open Wounding Event: Gradually Appeared Comorbid Arrhythmia, Hypertension, Gout, History: Neuropathy Date Acquired: 01/06/2015 Weeks Of Treatment: 9 Clustered Wound: No Photos Photo Uploaded By: Gretta Cool, RN, [name], Kim on 03/31/2015 09:26:09 Wound Measurements Length: (cm) 0.1 % Reduction in Ar Width: (cm) 0.1 % Reduction in Vo Depth: (cm) 0.1 Epithelialization Area: (cm) 0.008 Volume: (cm) 0.001 ea: 99.9% lume: 100% : Small (1-33%) Wound Description Full Thickness Without Exposed Foul Odor After Classification: Support Structures Wound Margin: Flat and  Intact Medium [name], Mark ([id number]) Cleansing: No Exudate Amount: Exudate Type: Serosanguineous Exudate Color: red, brown Wound Bed Granulation Amount: Large (67-100%) Exposed Structure Granulation Quality: Red, Pink Fascia Exposed: No Necrotic Amount: Small (1-33%) Fat Layer Exposed: No Necrotic Quality: Eschar Tendon Exposed: No Muscle Exposed: No Joint Exposed: No Bone Exposed: No Limited to Skin Breakdown Periwound Skin Texture Texture Color No Abnormalities Noted: No No Abnormalities Noted: No Callus: No Atrophie Blanche: No Crepitus: No Cyanosis: No Excoriation: No Ecchymosis: No Fluctuance: No Erythema: No Friable: No Hemosiderin Staining: No Induration: No Mottled: No Localized Edema: No Pallor: No Rash: No Rubor: No Scarring: No Temperature / Pain Moisture Temperature: No Abnormality No Abnormalities Noted: No Tenderness on Palpation: Yes Dry / Scaly: No Maceration: No Moist: Yes Wound Preparation Ulcer Cleansing: Rinsed/Irrigated with Saline, Other: antibacterial soap and water, Topical Anesthetic Applied: Other: lidocaine 4%, Treatment Notes Wound #1 (Right, Distal, Medial Lower Leg) 1. Cleansed with: Clean wound with Normal Saline 2. Anesthetic Topical Lidocaine 4% cream to wound bed prior to debridement 4. Dressing Applied: Other dressing (specify in notes) 5. Secondary Dressing Applied [name], [name] ([id number]) ABD Pad 7. Secured with 4-Layer Compression System - Right Lower Extremity Notes Sorbact applied directly to wound, hydrogel on top Electronic Signature(s) Signed: 03/31/2015 4:50:02 PM By: Gretta Cool, RN, [name], [name], [name] Entered By: Gretta Cool, RN, [name], Kim on 03/31/2015 08:17:01 [name], Mark Mark ([id number]) -------------------------------------------------------------------------------- [place] Details Patient Name: [name] Date of Service: 03/31/2015 8:00 AM Medical Record Number:  [id number] Patient Account Number: [id number] Date of Birth/Sex: 07-31-1950 (64 y.o. Male) Treating RN: Cornell Barman Primary Care Physician: PATIENT, NO Other Clinician: Referring Physician: Royetta Crochet Treating Physician/Extender: Frann Rider in Treatment: 9 Vital Signs Time Taken: 08:00 Temperature (F): 97.4 Height (in): 74 Pulse (bpm): 50 Weight (lbs): 238 Respiratory Rate (breaths/min): 18 Body Mass Index (BMI): 30.6 Blood Pressure (mmHg): 136/79 Reference Range:  80 - 120 mg / dl Electronic Signature(s) Signed: 03/31/2015 4:50:02 PM By: Gretta Cool, RN, [name], [name], [name] Entered By: Gretta Cool, RN, [name], Kim on 03/31/2015 08:04:20

## 2015-04-01 NOTE — Progress Notes (Signed)
[name], [name] ([id number]) Visit Report for 03/31/2015 Chief Complaint Document Details [name], 03/31/2015 8:00 Patient Name: Date of Service: [facility] AM Medical Record Patient Account Number: [id number] Number: Treating RN: [name] Date of Birth/Sex: 1951-03-26 (64 y.o. Male) Other Clinician: Primary Care Physician: PATIENT, NO Treating [name] Referring Physician: Royetta [name] Physician/Extender: [name] in Treatment: 9 Information Obtained from: Patient Chief Complaint Patient presents for treatment of an open ulcer due to venous insufficiency. He says he noticed a "into the right medial ankle for about 3-4 [name] Electronic Signature(s) Signed: 03/31/2015 8:18:46 AM By: Mark Fudge MD, FACS Entered By: [name] on 03/31/2015 08:18:45 [name], [name] ([id number]) -------------------------------------------------------------------------------- Debridement Details [name], 03/31/2015 8:00 Patient Name: Date of Service: [facility] AM Medical Record Patient Account Number: [id number] Number: Treating RN: [name] Date of Birth/Sex: Feb 01, 1951 (64 y.o. Male) Other Clinician: Primary Care Physician: PATIENT, NO Treating [name] Referring Physician: Royetta [name] Physician/Extender: [name] in Treatment: 9 Debridement Performed for Wound #1 Right,Distal,Medial Lower Leg Assessment: Performed By: Physician Mark Fudge, MD Debridement: Open Wound/Selective Debridement Selective Description: Pre-procedure Yes Verification/Time Out Taken: Start Time: 08:15 Pain Control: Other : lidocaine 4% Level: Non-Viable Tissue Total Area Debrided (L x 0.4 (cm) x 0.5 (cm) = 0.2 (cm) W): Tissue and other Non-Viable, Eschar material debrided: Instrument: Forceps Bleeding: None End Time: 08:16 Procedural Pain: 0 Post Procedural Pain: 0 Response to Treatment: Procedure was tolerated well Post Debridement Measurements of  Total Wound Length: (cm) 0.1 Width: (cm) 0.1 Depth: (cm) 0.1 Volume: (cm) 0.001 Post Procedure Diagnosis Same as Pre-procedure Electronic Signature(s) Signed: 03/31/2015 8:21:57 AM By: Mark Fudge MD, FACS Signed: 03/31/2015 4:50:02 PM By: Mark Cool, RN, [name], [name], [name] Entered By: [name] on 03/31/2015 08:21:57 [name], [name] ([id number]) [name], [name] ([id number]) -------------------------------------------------------------------------------- HPI Details [name], 03/31/2015 8:00 Patient Name: Date of Service: [facility] AM Medical Record Patient Account Number: [id number] Number: Treating RN: [name] Date of Birth/Sex: 1951/04/29 (64 y.o. Male) Other Clinician: Primary Care Physician: PATIENT, NO Treating [name] Referring Physician: Royetta [name] Physician/Extender: [name] in Treatment: 9 History of Present Illness Location: open wound just above his right ankle Quality: Patient reports experiencing a dull pain to affected area(s). Severity: Patient states wound are getting worse. Duration: Patient has had the wound for < 4 [name] prior to presenting for treatment Timing: Pain in wound is Intermittent (comes and goes Context: The wound appeared gradually over time Modifying Factors: Consults to this date include: he was seen in the ER and was referred to a vascular surgeon but the patient has not done that. He may have been treated with clindamycin in the ER. Associated Signs and Symptoms: Patient reports having difficulty standing for long periods. HPI Description: 64 year old gentleman who was seen in the emergency department recently on 01/06/2015 for a wound of his right lower extremity which he says was not involving any injury and he did not know how he sustained it. He had draining foul-smelling liquid from the area and had gone for care there. his past medical history is significant for DVT, hypertension, gout, tobacco abuse,  cocaine abuse, stroke, atrial fibrillation, pulmonary embolism. he has also had some vascular surgery with a stent placed in his leg. He has been a smoker for many years and has given up straight drugs several years ago. He continues to smoke about 4-5 cigarettes a day. 02/03/2015 -- received a note from 05/14/2013 where Dr. Leotis Pain placed an inferior vena cava filter. The patient had a deep vein thrombosis  while therapeutic on anticoagulation for previous DVT and a IVC filter was placed for this. 02/10/2015 -- he did have his vascular test done on Friday but we have no reports yet. 02/17/2015 -- notes were reviewed from the vascular office and the patient had a venous ultrasound done which revealed that he had no reflux in the greater saphenous vein or the short saphenous vein bilaterally. He did have subacute DVT in the common femoral vein and popliteal veins on the right and left side. The recommendation was to continue with Unna's boot therapy at the wound clinic and then to wear graduated compression stockings once the ulcers healed and later if he had continuous problems lymphedema pump would benefit him. 03/17/2015 -- we have applied for his insurance and aide regarding cellular tissue-based products and are still awaiting the final clearance. 03/24/2015 -- he has had Apligraf authorized for him but his wound is looking so good today that we may not use it. 03/31/2015 -- he has not yet received his compression stockings though we have called a couple of times and hopefully they should arrive this week. [name], [name] ([id number]) Electronic Signature(s) Signed: 03/31/2015 8:19:34 AM By: Mark Fudge MD, FACS Entered By: [name] on 03/31/2015 08:19:34 [name], [name] ([id number]) -------------------------------------------------------------------------------- Physical Exam Details Eskew, 03/31/2015 8:00 Patient Name: Date of Service: [facility] AM Medical  Record Patient Account Number: [id number] Number: Treating RN: [name] Date of Birth/Sex: 20-Mar-1951 (64 y.o. Male) Other Clinician: Primary Care Physician: PATIENT, NO Treating [name] Bromwell Referring Physician: Royetta [name] Physician/Extender: [name] in Treatment: 9 Constitutional . Pulse regular. Respirations normal and unlabored. Afebrile. . Eyes Nonicteric. Reactive to light. Ears, Nose, Mouth, and Throat Lips, teeth, and gums WNL.[name] Moist mucosa without lesions . Neck supple and nontender. No palpable supraclavicular or cervical adenopathy. Normal sized without goiter. Respiratory WNL. No retractions.. Cardiovascular Pedal Pulses WNL. No clubbing, cyanosis or edema. Lymphatic No adneopathy. No adenopathy. No adenopathy. Musculoskeletal Adexa without tenderness or enlargement.. Digits and nails w/o clubbing, cyanosis, infection, petechiae, ischemia, or inflammatory conditions.. Integumentary (Hair, Skin) No suspicious lesions. No crepitus or fluctuance. No peri-wound warmth or erythema. No masses.[name] Psychiatric Judgement and insight Intact.. No evidence of depression, anxiety, or agitation.. Notes except for a small area the wound is completely healed and his edema is minimal. His compression wraps had slipped a bit so he has edema just below his knee. Electronic Signature(s) Signed: 03/31/2015 8:20:15 AM By: Mark Fudge MD, FACS Entered By: [name] on 03/31/2015 08:20:14 [name], [name] ([id number]) -------------------------------------------------------------------------------- Physician Orders Details Albers, 03/31/2015 8:00 Patient Name: Date of Service: [facility] AM Medical Record Patient Account Number: [id number] Number: Treating RN: [name] Date of Birth/Sex: Oct 26, 1950 (64 y.o. Male) Other Clinician: Primary Care Physician: PATIENT, NO Treating Tyrone Pautsch Referring Physician: Royetta [name] Physician/Extender: [name] in Treatment: 9 Verbal / Phone Orders: Yes Clinician: Cornell [name] Read Back and Verified: Yes Diagnosis Coding Wound Cleansing Wound #1 Right,Distal,Medial Lower Leg o Clean wound with Normal Saline. o Cleanse wound with mild soap and water Anesthetic Wound #1 Right,Distal,Medial Lower Leg o Topical Lidocaine 4% cream applied to wound bed prior to debridement Primary Wound Dressing Wound #1 Right,Distal,Medial Lower Leg o Other: - Sorbact Secondary Dressing Wound #1 Right,Distal,Medial Lower Leg o ABD pad Dressing Change Frequency Wound #1 Right,Distal,Medial Lower Leg o Change dressing every week Follow-up Appointments Wound #1 Right,Distal,Medial Lower Leg o Return Appointment in 1 week. Edema Control o 4-Layer Compression System - Right Lower Extremity o Elevate legs to  the level of the heart and pump ankles as often as possible - and when sitting Electronic Signature(s) Signed: 03/31/2015 4:17:28 PM By: Mark Fudge MD, FACS Signed: 03/31/2015 4:50:02 PM By: Mark Cool RN, [name], [name], [name] [name], [name] Entered By: Mark Cool, RN, [name], [name] on 03/31/2015 08:17:30 Groninger, [name] ([id number]) -------------------------------------------------------------------------------- Problem List Details Guess, 03/31/2015 8:00 Patient Name: Date of Service: [facility] AM Medical Record Patient Account Number: [id number] Number: Treating RN: [name] Date of Birth/Sex: 1950-08-04 (64 y.o. 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/27/2015 Yes exposed F17.209 Nicotine dependence, unspecified, with unspecified 01/27/2015 Yes nicotine-induced disorders Inactive Problems Resolved Problems Electronic Signature(s) Signed: 03/31/2015 8:18:13 AM By: Mark Fudge MD, FACS Entered By: [name] on 03/31/2015 08:18:13 [name], [name] ([id number]) -------------------------------------------------------------------------------- Progress Note Details [name], 03/31/2015 8:00 Patient Name: Date of Service: [facility] AM Medical Record Patient Account Number: [id number] Number: Treating RN: [name] Date of Birth/Sex: [birth year] (64 y.o. Male) Other Clinician: Primary Care Physician: PATIENT, NO Treating Robynne Roat Referring Physician: Royetta [name] Physician/Extender: [name] in Treatment: 9 Subjective Chief Complaint Information obtained from Patient Patient presents for treatment of an open ulcer due to venous insufficiency. He says he noticed a "into the right medial ankle for about 3-4 [name] History of Present Illness (HPI) The following HPI elements were documented for the patient's wound: Location: open wound just above his right ankle Quality: Patient reports experiencing a dull pain to affected area(s). Severity: Patient states wound are getting worse. Duration: Patient has had the wound for < 4 [name] prior to presenting for treatment Timing: Pain in wound is Intermittent (comes and goes Context: The wound appeared gradually over time Modifying Factors: Consults to this date include: he was seen in the ER and was referred to a vascular surgeon but the patient has not done that. He may have been treated with clindamycin in the ER. Associated Signs and Symptoms: Patient reports having difficulty standing for long periods. 64 year old gentleman who was seen in the emergency department recently on 01/06/2015 for a  wound of his right lower extremity which he says was not involving any injury and he did not know how he sustained it. He had draining foul-smelling liquid from the area and had gone for care there. his past medical history is significant for DVT, hypertension, gout, tobacco abuse, cocaine abuse, stroke, atrial fibrillation, pulmonary embolism. he has also had some vascular surgery with a stent placed in his leg. He has been a smoker for many years and has given up straight drugs several years ago. He continues to smoke about 4-5 cigarettes a day. 02/03/2015 -- received a note from 05/14/2013 where Dr. Leotis Pain placed an inferior vena cava filter. The patient had a deep vein thrombosis while therapeutic on anticoagulation for previous DVT and a IVC filter was placed for this. 02/10/2015 -- he did have his vascular test done on Friday but we have no reports yet. 02/17/2015 -- notes were reviewed from the vascular office and the patient had a venous ultrasound done which revealed that he had no reflux in the greater saphenous vein or the short saphenous vein bilaterally. He  did have subacute DVT in the common femoral vein and popliteal veins on the right and left side. The recommendation was to continue with Unna's boot therapy at the wound clinic and then to wear graduated compression stockings once the ulcers healed and later if he had continuous problems lymphedema pump would benefit him. [name], [name] ([id number]) 03/17/2015 -- we have applied for his insurance and aide regarding cellular tissue-based products and are still awaiting the final clearance. 03/24/2015 -- he has had Apligraf authorized for him but his wound is looking so good today that we may not use it. 03/31/2015 -- he has not yet received his compression stockings though we have called a couple of times and hopefully they should arrive this week. Objective Constitutional Pulse regular. Respirations normal and  unlabored. Afebrile. Vitals Time Taken: 8:00 AM, Height: 74 in, Weight: 238 lbs, BMI: 30.6, Temperature: 97.4 F, Pulse: 50 bpm, Respiratory Rate: 18 breaths/min, Blood Pressure: 136/79 mmHg. Eyes Nonicteric. Reactive to light. Ears, Nose, Mouth, and Throat Lips, teeth, and gums WNL.[name] Moist mucosa without lesions . Neck supple and nontender. No palpable supraclavicular or cervical adenopathy. Normal sized without goiter. Respiratory WNL. No retractions.. Cardiovascular Pedal Pulses WNL. No clubbing, cyanosis or edema. Lymphatic No adneopathy. No adenopathy. No adenopathy. Musculoskeletal Adexa without tenderness or enlargement.. Digits and nails w/o clubbing, cyanosis, infection, petechiae, ischemia, or inflammatory conditions.[name] Psychiatric Judgement and insight Intact.. No evidence of depression, anxiety, or agitation.. General Notes: except for a small area the wound is completely healed and his edema is minimal. His compression wraps had slipped a bit so he has edema just below his knee. [name], [name] ([id number]) Integumentary (Hair, Skin) No suspicious lesions. No crepitus or fluctuance. No peri-wound warmth or erythema. No masses.. Wound #1 status is Open. Original cause of wound was Gradually Appeared. The wound is located on the Right,Distal,Medial Lower Leg. The wound measures 0.1cm length x 0.1cm width x 0.1cm depth; 0.008cm^2 area and 0.001cm^3 volume. The wound is limited to skin breakdown. There is a medium amount of serosanguineous drainage noted. The wound margin is flat and intact. There is large (67-100%) red, pink granulation within the wound bed. There is a small (1-33%) amount of necrotic tissue within the wound bed including Eschar. The periwound skin appearance exhibited: Moist. The periwound skin appearance did not exhibit: Callus, Crepitus, Excoriation, Fluctuance, Friable, Induration, Localized Edema, Rash, Scarring, Dry/Scaly, Maceration, Atrophie  Blanche, Cyanosis, Ecchymosis, Hemosiderin Staining, Mottled, Pallor, Rubor, Erythema. Periwound temperature was noted as No Abnormality. The periwound has tenderness on palpation. 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 We will also order compression stockings for him in anticipation of his wound healing and continued compression. He will see [place] back next week. Procedures Wound #1 Wound #1 is a Venous Leg Ulcer located on the Right,Distal,Medial Lower Leg . There was a Non-Viable Tissue Open Wound/Selective [phone]) debridement with total area of 0.2 sq cm performed by Mark Fudge, MD. with the following instrument(s): Forceps to remove Non-Viable tissue/material including Eschar after achieving pain control using Other (lidocaine 4%). A time out was conducted prior to the start of  the procedure. There was no bleeding. The procedure was tolerated well with a pain level of 0 throughout and a pain level of 0 following the procedure. Post Debridement Measurements: 0.1cm length x 0.1cm width x 0.1cm depth; 0.001cm^3 volume. Post procedure Diagnosis Wound #1: Same as Pre-Procedure [name], [name] ([id number]) Plan Wound Cleansing: Wound #1 Right,Distal,Medial Lower Leg: Clean wound with Normal Saline. Cleanse wound with mild soap and water Anesthetic: Wound #1 Right,Distal,Medial Lower Leg: Topical Lidocaine 4% cream applied to wound bed prior to debridement Primary Wound Dressing: Wound #1 Right,Distal,Medial Lower Leg: Other: -  Sorbact Secondary Dressing: Wound #1 Right,Distal,Medial Lower Leg: ABD pad Dressing Change Frequency: Wound #1 Right,Distal,Medial Lower Leg: Change dressing every week Follow-up Appointments: Wound #1 Right,Distal,Medial Lower Leg: Return Appointment in 1 week. Edema Control: 4-Layer Compression System - Right Lower Extremity Elevate legs to the level of the heart and pump ankles as often as possible - and when sitting In view of the fact that his compression stockings have not yet arrived we will continue to use Sorbact and 4-layer compression wrap. We will also order compression stockings for him in anticipation of his wound healing and continued compression. He will see [place] back next week. Electronic Signature(s) Signed: 03/31/2015 4:18:18 PM By: Mark Fudge MD, FACS Previous Signature: 03/31/2015 8:21:09 AM Version By: Mark Fudge MD, FACS Entered By: [name] on 03/31/2015 16:18:17 Span, [name] ([id number]) Olivero, [name] ([id number]) -------------------------------------------------------------------------------- SuperBill Details Patient Name: [name] Date of Service: 03/31/2015 Medical Record Number: [id number] Patient Account Number: [id number] Date of Birth/Sex: [birth year] (64 y.o. 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 of ankle and inflammation L97.312 Non-pressure chronic ulcer of right ankle with fat layer exposed F17.209 Nicotine dependence, unspecified, with unspecified nicotine-induced disorders Facility Procedures CPT4: Description Modifier Quantity Code [id number] - DEBRIDE WOUND 1ST 20 SQ CM OR < 1 ICD-10  Description Diagnosis 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 I83.213 Varicose veins of right lower extremity with both ulcer  of ankle and inflammation L97.312 Non-pressure chronic ulcer of right ankle with fat layer exposed F17.209 Nicotine dependence, unspecified, with unspecified nicotine-induced disorders Physician Procedures CPT4: Description Modifier Quantity Code [id number] - WC PHYS DEBR WO ANESTH 20 SQ CM 1 ICD-10 Description Diagnosis 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

## 2015-04-07 ENCOUNTER — Encounter: Payer: Medicare Other | Admitting: Surgery

## 2015-04-07 DIAGNOSIS — I83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: Secondary | ICD-10-CM | POA: Diagnosis not present

## 2015-04-08 NOTE — Progress Notes (Signed)
RAUN, ROUTH ([id number]) Visit Report for 04/07/2015 Arrival Information Details Kosanke, 04/07/2015 8:00 Patient Name: Date of Service: [facility] AM Medical Record Patient Account Number: [id number] Number: Treating RN: Cornell Barman Date of Birth/Sex: 08/15/1950 (64 y.o. Male) Other Clinician: Primary Care Physician: PATIENT, NO Treating Britto, Errol Referring Physician: Royetta Crochet Physician/Extender: Weeks in Treatment: 10 Visit Information History Since Last Visit All ordered tests and consults were completed: No Patient Arrived: Ambulatory Added or deleted any medications: No Arrival Time: 08:05 Any new allergies or adverse reactions: No Accompanied By: self Had a fall or experienced change in No Transfer Assistance: None activities of daily living that may affect Patient Identification Verified: Yes risk of falls: Secondary Verification Process Yes Signs or symptoms of abuse/neglect since last No Completed: visito Patient Requires Transmission- No Hospitalized since last visit: No Based Precautions: Pain Present Now: No Patient Has Alerts: Yes Patient Alerts: Patient on Blood Thinner warfarin ABI L 1.22 R 1.15 Electronic Signature(s) Signed: 04/08/2015 9:38:48 AM By: Alric Quan Entered By: Alric Quan on 04/07/2015 08:06:43 Windom, [name] ([id number]) -------------------------------------------------------------------------------- Clinic Level of Care Assessment Details Firestone, 04/07/2015 8:00 Patient Name: Date of Service: [facility] AM Medical Record Patient Account Number: [id number] Number: Treating RN: Cornell Barman Date of Birth/Sex: 12/28/1950 (64 y.o. Male) Other Clinician: Primary Care Physician: PATIENT, NO Treating Britto, Errol Referring Physician: Royetta Crochet Physician/Extender: Weeks in Treatment: 10 Clinic Level of Care Assessment Items TOOL 4 Quantity Score '[]'$  - Use when only an EandM is  performed on FOLLOW-UP visit 0 ASSESSMENTS - Nursing Assessment / Reassessment '[]'$  - Reassessment of Co-morbidities (includes updates in patient status) 0 X - Reassessment of Adherence to Treatment Plan 1 5 ASSESSMENTS - Wound and Skin Assessment / Reassessment X - Simple Wound Assessment / Reassessment - one wound 1 5 '[]'$  - Complex Wound Assessment / Reassessment - multiple wounds 0 '[]'$  - Dermatologic / Skin Assessment (not related to wound area) 0 ASSESSMENTS - Focused Assessment '[]'$  - Circumferential Edema Measurements - multi extremities 0 '[]'$  - Nutritional Assessment / Counseling / Intervention 0 '[]'$  - Lower Extremity Assessment (monofilament, tuning fork, pulses) 0 '[]'$  - Peripheral Arterial Disease Assessment (using hand held doppler) 0 ASSESSMENTS - Ostomy and/or Continence Assessment and Care '[]'$  - Incontinence Assessment and Management 0 '[]'$  - Ostomy Care Assessment and Management (repouching, etc.) 0 PROCESS - Coordination of Care '[]'$  - Simple Patient / Family Education for ongoing care 0 '[]'$  - Complex (extensive) Patient / Family Education for ongoing care 0 '[]'$  - Staff obtains Programmer, systems, Records, Test Results / Process Orders 0 '[]'$  - Staff telephones HHA, Nursing Homes / Clarify orders / etc 0 Stahle, Othel ([id number]) '[]'$  - Routine Transfer to another Facility (non-emergent condition) 0 '[]'$  - Routine Hospital Admission (non-emergent condition) 0 '[]'$  - New Admissions / Biomedical engineer / Ordering NPWT, Apligraf, etc. 0 '[]'$  - Emergency Hospital Admission (emergent condition) 0 X - Simple Discharge Coordination 1 10 '[]'$  - Complex (extensive) Discharge Coordination 0 PROCESS - Special Needs '[]'$  - Pediatric / Minor Patient Management 0 '[]'$  - Isolation Patient Management 0 '[]'$  - Hearing / Language / Visual special needs 0 '[]'$  - Assessment of Community assistance (transportation, D/C planning, etc.) 0 '[]'$  - Additional assistance / Altered mentation 0 '[]'$  - Support Surface(s) Assessment  (bed, cushion, seat, etc.) 0 INTERVENTIONS - Wound Cleansing / Measurement X - Simple Wound Cleansing - one wound 1 5 '[]'$  - Complex Wound Cleansing - multiple wounds 0 X - Wound Imaging (photographs - any number  of wounds) 1 5 '[]'$  - Wound Tracing (instead of photographs) 0 '[]'$  - Simple Wound Measurement - one wound 0 '[]'$  - Complex Wound Measurement - multiple wounds 0 INTERVENTIONS - Wound Dressings '[]'$  - Small Wound Dressing one or multiple wounds 0 '[]'$  - Medium Wound Dressing one or multiple wounds 0 '[]'$  - Large Wound Dressing one or multiple wounds 0 '[]'$  - Application of Medications - topical 0 '[]'$  - Application of Medications - injection 0 Mclouth, Hanif ([id number]) INTERVENTIONS - Miscellaneous '[]'$  - External ear exam 0 '[]'$  - Specimen Collection (cultures, biopsies, blood, body fluids, etc.) 0 '[]'$  - Specimen(s) / Culture(s) sent or taken to Lab for analysis 0 '[]'$  - Patient Transfer (multiple staff / Harrel Lemon Lift / Similar devices) 0 '[]'$  - Simple Staple / Suture removal (25 or less) 0 '[]'$  - Complex Staple / Suture removal (26 or more) 0 '[]'$  - Hypo / Hyperglycemic Management (close monitor of Blood Glucose) 0 '[]'$  - Ankle / Brachial Index (ABI) - do not check if billed separately 0 X - Vital Signs 1 5 Has the patient been seen at the hospital within the last three years: Yes Total Score: 35 Level Of Care: New/Established - Level 1 Electronic Signature(s) Signed: 04/08/2015 9:38:48 AM By: Alric Quan Entered By: Alric Quan on 04/07/2015 08:34:31 Marchiano, [name] ([id number]) -------------------------------------------------------------------------------- Encounter Discharge Information Details Maertens, 04/07/2015 8:00 Patient Name: Date of Service: [facility] AM Medical Record Patient Account Number: [id number] Number: Treating RN: Cornell Barman Date of Birth/Sex: 02/22/1951 (64 y.o. Male) Other Clinician: Primary Care Physician: PATIENT, NO Treating Britto,  Errol Referring Physician: Royetta Crochet Physician/Extender: Weeks in Treatment: 10 Encounter Discharge Information Items Discharge Pain Level: 0 Discharge Condition: Stable Ambulatory Status: Ambulatory Discharge Destination: Home Transportation: Private Auto Accompanied By: self Schedule Follow-up Appointment: No Medication Reconciliation completed and provided to Patient/Care No Argie Applegate: Provided on Clinical Summary of Care: 04/07/2015 Form Type Recipient Paper Patient WD Electronic Signature(s) Signed: 04/08/2015 9:38:48 AM By: Alric Quan Previous Signature: 04/07/2015 8:31:48 AM Version By: Ruthine Dose Entered By: Alric Quan on 04/07/2015 08:35:22 Firestine, [name] ([id number]) -------------------------------------------------------------------------------- Lower Extremity Assessment Details Gillentine, 04/07/2015 8:00 Patient Name: Date of Service: [facility] AM Medical Record Patient Account Number: [id number] Number: Treating RN: Cornell Barman Date of Birth/Sex: 02/18/1951 (64 y.o. Male) Other Clinician: Primary Care Physician: PATIENT, NO Treating Britto, Errol Referring Physician: Royetta Crochet Physician/Extender: Weeks in Treatment: 10 Edema Assessment Assessed: [Left: No] [Right: No] Edema: [Left: Ye] [Right: s] Calf Left: Right: Point of Measurement: 39 cm From Medial Instep cm 46 cm Ankle Left: Right: Point of Measurement: 12 cm From Medial Instep cm 29 cm Vascular Assessment Pulses: Posterior Tibial Dorsalis Pedis Palpable: [Left:Yes] Extremity colors, hair growth, and conditions: Extremity Color: [Left:Normal] Hair Growth on Extremity: [Left:Yes] Temperature of Extremity: [Left:Warm] Capillary Refill: [Left:< 3 seconds] Toe Nail Assessment Left: Right: Thick: No Discolored: Yes Deformed: No Improper Length and Hygiene: No Electronic Signature(s) Signed: 04/07/2015 4:48:54 PM By: Gretta Cool, RN, [name], [name], [name] Signed:  04/08/2015 9:38:48 AM By: Alric Quan Shelden, [name] ([id number]) Entered By: Alric Quan on 04/07/2015 08:15:00 Schiefelbein, [name] ([id number]) -------------------------------------------------------------------------------- [place] Details Christopherson, 04/07/2015 8:00 Patient Name: Date of Service: [facility] AM Medical Record Patient Account Number: [id number] Number: Treating RN: Cornell Barman Date of Birth/Sex: [birth year] (64 y.o. Male) Other Clinician: Primary Care Physician: PATIENT, NO Treating Britto, Errol Referring Physician: Royetta Crochet Physician/Extender: Weeks in Treatment: 10 Active Inactive Electronic Signature(s) Signed: 04/07/2015 4:48:54 PM By: Gretta Cool, RN, [name], [name],  [name] Signed: 04/08/2015 9:38:48 AM By: Alric Quan Entered By: Alric Quan on 04/07/2015 08:32:29 Bressman, [name] ([id number]) -------------------------------------------------------------------------------- Pain Assessment Details Lechtenberg, 04/07/2015 8:00 Patient Name: Date of Service: [facility] AM Medical Record Patient Account Number: [id number] Number: Treating RN: Cornell Barman Date of Birth/Sex: August 10, 1950 (64 y.o. Male) Other Clinician: Primary Care Physician: PATIENT, NO Treating Britto, Errol Referring Physician: Royetta Crochet Physician/Extender: Weeks in Treatment: 10 Active Problems Location of Pain Severity and Description of Pain Patient Has Paino No Site Locations Rate the pain. Current Pain Level: 0 Pain Management and Medication Current Pain Management: Electronic Signature(s) Signed: 04/07/2015 4:48:54 PM By: Gretta Cool, RN, [name], [name], [name] Signed: 04/08/2015 9:38:48 AM By: Alric Quan Entered By: Alric Quan on 04/07/2015 08:06:58 [name], [name] ([id number]) -------------------------------------------------------------------------------- Patient/Caregiver Education Details Rud,  04/07/2015 8:00 Patient Name: Date of Service: [facility] AM Medical Record Patient Account Number: [id number] Number: Treating RN: Cornell Barman Date of Birth/Gender: 25-May-1950 (64 y.o. Male) Other Clinician: Primary Care Physician: PATIENT, NO Treating Britto, Errol Referring Physician: Royetta Crochet Physician/Extender: Weeks in Treatment: 10 Education Assessment Education Provided To: Patient Education Topics Provided Wound/Skin Impairment: Handouts: Other: stockings daily Methods: Demonstration, Explain/Verbal Responses: State content correctly Electronic Signature(s) Signed: 04/08/2015 9:38:48 AM By: Alric Quan Entered By: Alric Quan on 04/07/2015 08:36:20 Castrellon, [name] ([id number]) -------------------------------------------------------------------------------- Wound Assessment Details Tadlock, 04/07/2015 8:00 Patient Name: Date of Service: [facility] AM Medical Record Patient Account Number: [id number] Number: Treating RN: Cornell Barman Date of Birth/Sex: [birth year] (64 y.o. Male) Other Clinician: Primary Care Physician: PATIENT, NO Treating Britto, Errol Referring Physician: Royetta Crochet Physician/Extender: Weeks in Treatment: 10 Wound Status Wound Number: 1 Primary Etiology: Venous Leg Ulcer Wound Location: Right, Distal, Medial Lower Wound Status: Healed - Epithelialized Leg Wounding Event: Gradually Appeared Date Acquired: 01/06/2015 Weeks Of Treatment: 10 Clustered Wound: No Photos Photo Uploaded By: Gretta Cool, RN, [name], Kim on 04/07/2015 17:02:08 Wound Measurements Length: (cm) 0 % Reducti Width: (cm) 0 % Reducti Depth: (cm) 0 Area: (cm) 0 Volume: (cm) 0 on in Area: 100% on in Volume: 100% Wound Description Full Thickness Without Exposed Classification: Support Structures Periwound Skin Texture Texture Color No Abnormalities Noted: No No Abnormalities Noted: No Moisture No Abnormalities Noted:  No Lauderback, [name] ([id number]) Electronic Signature(s) Signed: 04/07/2015 4:48:54 PM By: Gretta Cool, RN, [name], [name], [name] Signed: 04/08/2015 9:38:48 AM By: Alric Quan Entered By: Alric Quan on 04/07/2015 08:31:42 Scribner, [name] ([id number]) -------------------------------------------------------------------------------- Vitals Details Comp, 04/07/2015 8:00 Patient Name: Date of Service: [facility] AM Medical Record Patient Account Number: [id number] Number: Treating RN: Cornell Barman Date of Birth/Sex: 1950/06/07 (64 y.o. Male) Other Clinician: Primary Care Physician: PATIENT, NO Treating Britto, Errol Referring Physician: Royetta Crochet Physician/Extender: Weeks in Treatment: 10 Vital Signs Time Taken: 08:07 Temperature (F): 98.3 Height (in): 74 Pulse (bpm): 96 Weight (lbs): 238 Respiratory Rate (breaths/min): 20 Body Mass Index (BMI): 30.6 Blood Pressure (mmHg): 148/103 Reference Range: 80 - 120 mg / dl Electronic Signature(s) Signed: 04/08/2015 9:38:48 AM By: Alric Quan Entered By: Alric Quan on 04/07/2015 93:71:69

## 2015-04-08 NOTE — Progress Notes (Signed)
[name], [name] ([id number]) Visit Report for 04/07/2015 Chief Complaint Document Details [place], 04/07/2015 8:00 Patient Name: Date of Service: [facility] AM Medical Record Patient Account Number: [id number] Number: Treating RN: Cornell Barman Date of Birth/Sex: 28-Feb-1951 (64 y.o. Male) Other Clinician: Primary Care Physician: PATIENT, NO Treating Lavonna Lampron Referring Physician: Royetta Crochet Physician/Extender: Weeks in Treatment: 10 Information Obtained from: Patient Chief Complaint Patient presents for treatment of an open ulcer due to venous insufficiency. He says he noticed a "into the right medial ankle for about 3-4 weeks Electronic Signature(s) Signed: 04/07/2015 8:47:05 AM By: Christin Fudge MD, FACS Entered By: Christin Fudge on 04/07/2015 08:47:05 [name], [name] ([id number]) -------------------------------------------------------------------------------- HPI Details [place], 04/07/2015 8:00 Patient Name: Date of Service: [facility] AM Medical Record Patient Account Number: [id number] Number: Treating RN: Cornell Barman Date of Birth/Sex: [birth year] (64 y.o. Male) Other Clinician: Primary Care Physician: PATIENT, NO Treating Racer Quam Referring Physician: Royetta Crochet Physician/Extender: Weeks in Treatment: 10 History of Present Illness Location: open wound just above his right ankle Quality: Patient reports experiencing a dull pain to affected area(s). Severity: Patient states wound are getting worse. Duration: Patient has had the wound for < 4 weeks prior to presenting for treatment Timing: Pain in wound is Intermittent (comes and goes Context: The wound appeared gradually over time Modifying Factors: Consults to this date include: he was seen in the ER and was referred to a vascular surgeon but the patient has not done that. He may have been treated with clindamycin in the ER. Associated Signs and Symptoms: Patient reports  having difficulty standing for long periods. HPI Description: 64 year old gentleman who was seen in the emergency department recently on 01/06/2015 for a wound of his right lower extremity which he says was not involving any injury and he did not know how he sustained it. He had draining foul-smelling liquid from the area and had gone for care there. his past medical history is significant for DVT, hypertension, gout, tobacco abuse, cocaine abuse, stroke, atrial fibrillation, pulmonary embolism. he has also had some vascular surgery with a stent placed in his leg. He has been a smoker for many years and has given up straight drugs several years ago. He continues to smoke about 4-5 cigarettes a day. 02/03/2015 -- received a note from 05/14/2013 where Dr. Leotis Pain placed an inferior vena cava filter. The patient had a deep vein thrombosis while therapeutic on anticoagulation for previous DVT and a IVC filter was placed for this. 02/10/2015 -- he did have his vascular test done on Friday but we have no reports yet. 02/17/2015 -- notes were reviewed from the vascular office and the patient had a venous ultrasound done which revealed that he had no reflux in the greater saphenous vein or the short saphenous vein bilaterally. He did have subacute DVT in the common femoral vein and popliteal veins on the right and left side. The recommendation was to continue with Unna's boot therapy at the wound clinic and then to wear graduated compression stockings once the ulcers healed and later if he had continuous problems lymphedema pump would benefit him. 03/17/2015 -- we have applied for his insurance and aide regarding cellular tissue-based products and are still awaiting the final clearance. 03/24/2015 -- he has had Apligraf authorized for him but his wound is looking so good today that we may not use it. 03/31/2015 -- he has not yet received his compression stockings though we have called a couple of  times and hopefully they should arrive this  week. [name], [name] Electronic Signature(s) Signed: 04/07/2015 8:47:10 AM By: Christin Fudge MD, FACS Entered By: Christin Fudge on 04/07/2015 08:47:10 [name], [name] ([id number]) -------------------------------------------------------------------------------- Physical Exam Details [place], 04/07/2015 8:00 Patient Name: Date of Service: [facility] AM Medical Record Patient Account Number: [id number] Number: Treating RN: Cornell Barman Date of Birth/Sex: [birth year] (64 y.o. Male) Other Clinician: Primary Care Physician: PATIENT, NO Treating Narda Fundora Referring Physician: Royetta Crochet Physician/Extender: Weeks in Treatment: 10 Constitutional . Pulse regular. Respirations normal and unlabored. Afebrile. . Eyes Nonicteric. Reactive to light. Ears, Nose, Mouth, and Throat Lips, teeth, and gums WNL.[name] Moist mucosa without lesions . Neck supple and nontender. No palpable supraclavicular or cervical adenopathy. Normal sized without goiter. Respiratory WNL. No retractions.. Cardiovascular Pedal Pulses WNL. No clubbing, cyanosis or edema. Chest Breasts symmetical and no nipple discharge.. Breast tissue WNL, no masses, lumps, or tenderness.. Lymphatic No adneopathy. No adenopathy. No adenopathy. Musculoskeletal Adexa without tenderness or enlargement.. Digits and nails w/o clubbing, cyanosis, infection, petechiae, ischemia, or inflammatory conditions.. Integumentary (Hair, Skin) No suspicious lesions. No crepitus or fluctuance. No peri-wound warmth or erythema. No masses.[name] Psychiatric Judgement and insight Intact.. No evidence of depression, anxiety, or agitation.. Notes the wound is completely healed and his edema is significantly less. Electronic Signature(s) Signed: 04/07/2015 8:47:40 AM By: Christin Fudge MD, FACS Entered By: Christin Fudge on 04/07/2015 08:47:39 [name], [name]  ([id number]) -------------------------------------------------------------------------------- Physician Orders Details [place], 04/07/2015 8:00 Patient Name: Date of Service: [facility] AM Medical Record Patient Account Number: [id number] Number: Treating RN: Cornell Barman Date of Birth/Sex: 01-Oct-1950 (64 y.o. Male) Other Clinician: Primary Care Physician: PATIENT, NO Treating Camryn Lampson Referring Physician: Royetta Crochet Physician/Extender: Suella [place] in Treatment: 10 Verbal / Phone Orders: Yes Clinician: Cornell Barman Read Back and Verified: Yes Diagnosis Coding Edema Control o Patient to wear own compression stockings Discharge From [place] o Discharge from [place] - treatment complete Electronic Signature(s) Signed: 04/07/2015 4:11:13 PM By: Christin Fudge MD, FACS Signed: 04/08/2015 9:38:48 AM By: Alric Quan Entered By: Alric Quan on 04/07/2015 08:33:42 [place], [name] ([id number]) -------------------------------------------------------------------------------- Problem List Details [place], 04/07/2015 8:00 Patient Name: Date of Service: [facility] AM Medical Record Patient Account Number: [id number] Number: Treating RN: Cornell Barman Date of Birth/Sex: 06-Nov-1950 (64 y.o. 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/27/2015 Yes exposed F17.209 Nicotine dependence, unspecified, with unspecified 01/27/2015 Yes nicotine-induced disorders Inactive Problems Resolved Problems Electronic  Signature(s) Signed: 04/07/2015 8:46:56 AM By: Christin Fudge MD, FACS Entered By: Christin Fudge on 04/07/2015 08:46:56 [name], [name] ([id number]) -------------------------------------------------------------------------------- Progress Note Details [place], 04/07/2015 8:00 Patient Name: Date of Service: [facility] AM Medical Record Patient Account Number: [id number] Number: Treating RN: Cornell Barman Date of Birth/Sex: Mar 08, 1951 (64 y.o. Male) Other Clinician: Primary Care Physician: PATIENT, NO Treating Domini Vandehei Referring Physician: Royetta Crochet Physician/Extender: Weeks in Treatment: 10 Subjective Chief Complaint Information obtained from Patient Patient presents for treatment of an open ulcer due to venous insufficiency. He says he noticed a "into the right medial ankle for about 3-4 weeks History of Present Illness (HPI) The following HPI elements were documented for the patient's wound: Location: open wound just above his right  ankle Quality: Patient reports experiencing a dull pain to affected area(s). Severity: Patient states wound are getting worse. Duration: Patient has had the wound for < 4 weeks prior to presenting for treatment Timing: Pain in wound is Intermittent (comes and goes Context: The wound appeared gradually over time Modifying Factors: Consults to this date include: he was seen in the ER and was referred to a vascular surgeon but the patient has not done that. He may have been treated with clindamycin in the ER. Associated Signs and Symptoms: Patient reports having difficulty standing for long periods. 64 year old gentleman who was seen in the emergency department recently on 01/06/2015 for a wound of his right lower extremity which he says was not involving any injury and he did not know how he sustained it. He had draining foul-smelling liquid from the area and had gone for care there. his past medical history is significant for DVT,  hypertension, gout, tobacco abuse, cocaine abuse, stroke, atrial fibrillation, pulmonary embolism. he has also had some vascular surgery with a stent placed in his leg. He has been a smoker for many years and has given up straight drugs several years ago. He continues to smoke about 4-5 cigarettes a day. 02/03/2015 -- received a note from 05/14/2013 where Dr. Leotis Pain placed an inferior vena cava filter. The patient had a deep vein thrombosis while therapeutic on anticoagulation for previous DVT and a IVC filter was placed for this. 02/10/2015 -- he did have his vascular test done on Friday but we have no reports yet. 02/17/2015 -- notes were reviewed from the vascular office and the patient had a venous ultrasound done which revealed that he had no reflux in the greater saphenous vein or the short saphenous vein bilaterally. He did have subacute DVT in the common femoral vein and popliteal veins on the right and left side. The recommendation was to continue with Unna's boot therapy at the wound clinic and then to wear graduated compression stockings once the ulcers healed and later if he had continuous problems lymphedema pump would benefit him. [name], [name] ([id number]) 03/17/2015 -- we have applied for his insurance and aide regarding cellular tissue-based products and are still awaiting the final clearance. 03/24/2015 -- he has had Apligraf authorized for him but his wound is looking so good today that we may not use it. 03/31/2015 -- he has not yet received his compression stockings though we have called a couple of times and hopefully they should arrive this week. Objective Constitutional Pulse regular. Respirations normal and unlabored. Afebrile. Vitals Time Taken: 8:07 AM, Height: 74 in, Weight: 238 lbs, BMI: 30.6, Temperature: 98.3 F, Pulse: 96 bpm, Respiratory Rate: 20 breaths/min, Blood Pressure: 148/103 mmHg. Eyes Nonicteric. Reactive to light. Ears, Nose, Mouth, and  Throat Lips, teeth, and gums WNL.[name] Moist mucosa without lesions . Neck supple and nontender. No palpable supraclavicular or cervical adenopathy. Normal sized without goiter. Respiratory WNL. No retractions.. Cardiovascular Pedal Pulses WNL. No clubbing, cyanosis or edema. Chest Breasts symmetical and no nipple discharge.. Breast tissue WNL, no masses, lumps, or tenderness.. Lymphatic No adneopathy. No adenopathy. No adenopathy. Musculoskeletal Adexa without tenderness or enlargement.. Digits and nails w/o clubbing, cyanosis, infection, petechiae, ischemia, or inflammatory conditions.[name] Psychiatric Judgement and insight Intact.. No evidence of depression, anxiety, or agitation.[name] [name], [name] ([id number]) General Notes: the wound is completely healed and his edema is significantly less. Integumentary (Hair, Skin) No suspicious lesions. No crepitus or fluctuance. No peri-wound warmth or erythema. No masses.. Wound #1 status is  Healed - Epithelialized. Original cause of wound was Gradually Appeared. The wound is located on the Right,Distal,Medial Lower Leg. The wound measures 0cm length x 0cm width x 0cm depth; 0cm^2 area and 0cm^3 volume. 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 We have discussed the methodology of wearing the compression stockings and I have instructed him to wear them all day starting first thing in the morning and moving them only at bedtime. He'll also elevate his limbs as often as possible during the day and also while he is in bed. He  is encouraged to follow-up with the vascular office and see them on a regular basis. He is discharged from the wound care services and will be seen back as needed. Plan Edema Control: Patient to wear own compression stockings Discharge From [facility] Services: Discharge from [place] complete [name], [name] ([id number]) His wounds have completely healed and he is ready to go home with compression stockings which she has accquired. We have discussed the methodology of wearing the compression stockings and I have instructed him to wear them all day starting first thing in the morning and moving them only at bedtime. He'll also elevate his limbs as often as possible during the day and also while he is in bed. He is encouraged to follow-up with the vascular office and see them on a regular basis. He is discharged from the wound care services and will be seen back as needed. Electronic Signature(s) Signed: 04/07/2015 8:49:13 AM By: Christin Fudge MD, FACS Entered By: Christin Fudge on 04/07/2015 08:49:13 [name], [name] ([id number]) -------------------------------------------------------------------------------- SuperBill Details Patient Name: [name] Date of Service: 04/07/2015 Medical Record Number: [id number] Patient Account Number: [id number] Date of Birth/Sex: 16-Feb-1951 (64 y.o. 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 of ankle and inflammation L97.312 Non-pressure chronic ulcer of right ankle with fat layer exposed F17.209 Nicotine dependence, unspecified, with unspecified nicotine-induced disorders Facility Procedures CPT4 Code:  29798921 Description: 19417 - WOUND CARE VISIT-LEV 1 EST PT Modifier: Quantity: 1 Physician Procedures CPT4: Description Modifier Quantity Code [id number] 99213 - WC PHYS LEVEL 3 - EST PT 1 ICD-10 Description Diagnosis 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

## 2015-05-11 ENCOUNTER — Emergency Department
Admission: EM | Admit: 2015-05-11 | Discharge: 2015-05-11 | Disposition: A | Discharge status: Home / Self Care | Payer: Medicare Other | Attending: Emergency Medicine | Admitting: Emergency Medicine

## 2015-05-11 ENCOUNTER — Emergency Department: Payer: Medicare Other

## 2015-05-11 ENCOUNTER — Encounter: Payer: Self-pay | Admitting: Emergency Medicine

## 2015-05-11 DIAGNOSIS — K5900 Constipation, unspecified: Secondary | ICD-10-CM | POA: Diagnosis not present

## 2015-05-11 DIAGNOSIS — Z79899 Other long term (current) drug therapy: Secondary | ICD-10-CM | POA: Diagnosis not present

## 2015-05-11 DIAGNOSIS — F17210 Nicotine dependence, cigarettes, uncomplicated: Secondary | ICD-10-CM | POA: Insufficient documentation

## 2015-05-11 DIAGNOSIS — Z792 Long term (current) use of antibiotics: Secondary | ICD-10-CM | POA: Insufficient documentation

## 2015-05-11 DIAGNOSIS — R1032 Left lower quadrant pain: Secondary | ICD-10-CM

## 2015-05-11 DIAGNOSIS — Z7901 Long term (current) use of anticoagulants: Secondary | ICD-10-CM | POA: Diagnosis not present

## 2015-05-11 DIAGNOSIS — I10 Essential (primary) hypertension: Secondary | ICD-10-CM | POA: Insufficient documentation

## 2015-05-11 DIAGNOSIS — E119 Type 2 diabetes mellitus without complications: Secondary | ICD-10-CM | POA: Insufficient documentation

## 2015-05-11 LAB — URINALYSIS COMPLETE WITH MICROSCOPIC (ARMC ONLY)
BACTERIA UA: NONE SEEN
BILIRUBIN URINE: NEGATIVE
Glucose, UA: NEGATIVE mg/dL
Ketones, ur: NEGATIVE mg/dL
Leukocytes, UA: NEGATIVE
Nitrite: NEGATIVE
Protein, ur: >500 mg/dL — AB
SQUAMOUS EPITHELIAL / LPF: NONE SEEN
Specific Gravity, Urine: 1.028 (ref 1.005–1.030)
pH: 5 (ref 5.0–8.0)

## 2015-05-11 LAB — URINE DRUG SCREEN, QUALITATIVE (ARMC ONLY)
Amphetamines, Ur Screen: NOT DETECTED
BARBITURATES, UR SCREEN: NOT DETECTED
Benzodiazepine, Ur Scrn: NOT DETECTED
COCAINE METABOLITE, UR ~~LOC~~: NOT DETECTED
Cannabinoid 50 Ng, Ur ~~LOC~~: NOT DETECTED
MDMA (ECSTASY) UR SCREEN: NOT DETECTED
METHADONE SCREEN, URINE: NOT DETECTED
OPIATE, UR SCREEN: POSITIVE — AB
Phencyclidine (PCP) Ur S: NOT DETECTED
TRICYCLIC, UR SCREEN: NOT DETECTED

## 2015-05-11 LAB — CBC
HEMATOCRIT: 38.3 % — AB (ref 40.0–52.0)
Hemoglobin: 12.3 g/dL — ABNORMAL LOW (ref 13.0–18.0)
MCH: 24.2 pg — AB (ref 26.0–34.0)
MCHC: 32.2 g/dL (ref 32.0–36.0)
MCV: 75.3 fL — ABNORMAL LOW (ref 80.0–100.0)
Platelets: 133 10*3/uL — ABNORMAL LOW (ref 150–440)
RBC: 5.09 MIL/uL (ref 4.40–5.90)
RDW: 17.8 % — AB (ref 11.5–14.5)
WBC: 4.5 10*3/uL (ref 3.8–10.6)

## 2015-05-11 LAB — COMPREHENSIVE METABOLIC PANEL
ALBUMIN: 3.8 g/dL (ref 3.5–5.0)
ALT: 23 U/L (ref 17–63)
AST: 34 U/L (ref 15–41)
Alkaline Phosphatase: 84 U/L (ref 38–126)
Anion gap: 7 (ref 5–15)
BUN: 14 mg/dL (ref 6–20)
CHLORIDE: 105 mmol/L (ref 101–111)
CO2: 27 mmol/L (ref 22–32)
CREATININE: 0.91 mg/dL (ref 0.61–1.24)
Calcium: 8.9 mg/dL (ref 8.9–10.3)
GFR calc Af Amer: >60 mL/min (ref >60)
GLUCOSE: 105 mg/dL — AB (ref 65–99)
Potassium: 3.7 mmol/L (ref 3.5–5.1)
Sodium: 139 mmol/L (ref 135–145)
Total Bilirubin: 0.7 mg/dL (ref 0.3–1.2)
Total Protein: 7.2 g/dL (ref 6.5–8.1)

## 2015-05-11 LAB — LIPASE, BLOOD: LIPASE: 17 U/L (ref 11–51)

## 2015-05-11 LAB — ETHANOL: Alcohol, Ethyl (B): <5 mg/dL (ref <5)

## 2015-05-11 LAB — PROTIME-INR
INR: 2.46
PROTHROMBIN TIME: 26.4 s — AB (ref 11.4–15.0)

## 2015-05-11 MED ORDER — SODIUM CHLORIDE 0.9 % IV BOLUS (SEPSIS)
1000.0000 mL | Freq: Once | INTRAVENOUS | Status: AC
  Administered 2015-05-11: 1000 mL via INTRAVENOUS

## 2015-05-11 MED ORDER — MORPHINE SULFATE (PF) 4 MG/ML IV SOLN
4.0000 mg | Freq: Once | INTRAVENOUS | Status: AC
  Administered 2015-05-11: 4 mg via INTRAVENOUS
  Filled 2015-05-11: qty 1

## 2015-05-11 MED ORDER — ONDANSETRON HCL 4 MG/2ML IJ SOLN
4.0000 mg | Freq: Once | INTRAMUSCULAR | Status: AC
  Administered 2015-05-11: 4 mg via INTRAVENOUS
  Filled 2015-05-11: qty 2

## 2015-05-11 MED ORDER — LACTULOSE 10 GM/15ML PO SOLN: 20.0000 g | Freq: Every day | ORAL | Status: DC | PRN

## 2015-05-11 NOTE — ED Provider Notes (Addendum)
[facility] Emergency Department Provider Note  ____________________________________________  Time seen: Approximately 3:25 AM  I have reviewed the triage vital signs and the nursing notes.   HISTORY  Chief Complaint Abdominal Pain    HPI [name] is a 64 y.o. male who presents to the ED from home with a chief complain of abdominal pain. Patient complains of left lower abdominal pain and left flank pain 2 days, waxing/waning. Denies associated symptoms of fever, chills, nausea, vomiting, diarrhea, dysuria. Denies testicular pain or swelling. Denies recent travel or trauma. Denies chest pain, shortness of breath, cough. Nothing makes his pain better. Movement and laying makes his pain worse.   Past Medical History  Diagnosis Date  . History of deep vein thrombosis 2006  . Hypertension   . Gout current and history of  . History of tobacco abuse     has smoked for 50 years  . Cocaine abuse     + UDS on admission  . Stroke ([place])   . Atrial fibrillation ([place])   . Pulmonary embolism [place])     Patient Active Problem List   Diagnosis Date Noted  . 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 ([place]) 02/24/2015  . Pneumonia 10/12/2014  . CVA (cerebral infarction) 04/26/2011  . Diabetes mellitus 04/26/2011  . 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 Anemia 04/26/2011  . DVT (deep venous thrombosis) ([place]) 04/26/2011    Past Surgical History  Procedure Laterality Date  . Vascular surgery      Stent in leg    Current Outpatient Rx  Name  Route  Sig  Dispense  Refill  . colchicine 0.6 MG tablet   Oral   Take 0.6 mg by mouth daily as needed (for gout).          . metoprolol tartrate (LOPRESSOR) 25 MG tablet   Oral   Take 25 mg by mouth 2 (two) times daily.         . simvastatin (ZOCOR) 20 MG tablet   Oral   Take 20 mg by mouth daily.         [name] warfarin (COUMADIN) 6 MG tablet   Oral   Take 6 mg by mouth daily.  Every evening at 5 pm         . clindamycin (CLEOCIN) 300 MG capsule   Oral   Take 1 capsule (300 mg total) by mouth 3 (three) times daily.   30 capsule   0   . lactulose (CHRONULAC) 10 GM/15ML solution   Oral   Take 30 mLs (20 g total) by mouth daily as needed for mild constipation.   120 mL   0   . levofloxacin (LEVAQUIN) 500 MG tablet   Oral   Take 1 tablet (500 mg total) by mouth daily.   5 tablet   0   . traMADol (ULTRAM) 50 MG tablet   Oral   Take 1 tablet (50 mg total) by mouth every 6 (six) hours as needed.   15 tablet   0     Allergies Review of patient's allergies indicates no known allergies.  Family History  Problem Relation Age of Onset  . Aneurysm Mother   . Diabetes Father     Social History Social History  Substance Use Topics  . Smoking status: Current Every Day Smoker -- 0.50 packs/day for 50 years    Types: Cigarettes  . Smokeless tobacco: Never Used  . Alcohol Use: No  Review of Systems Constitutional: No fever/chills Eyes: No visual changes. ENT: No sore throat. Cardiovascular: Denies chest pain. Respiratory: Denies shortness of breath. Gastrointestinal: Positive for abdominal pain.  No nausea, no vomiting.  No diarrhea.  No constipation. Genitourinary: Negative for dysuria. Musculoskeletal: Negative for back pain. Skin: Negative for rash. Neurological: Negative for headaches, focal weakness or numbness.  10-point ROS otherwise negative.  ____________________________________________   PHYSICAL EXAM:  VITAL SIGNS: ED Triage Vitals  Enc Vitals Group     BP 05/11/15 0220 147/83 mmHg     Pulse Rate 05/11/15 0214 72     Resp 05/11/15 0214 20     Temp 05/11/15 0220 97.5 F (36.4 C)     Temp Source 05/11/15 0220 Oral     SpO2 05/11/15 0214 98 %     Weight 05/11/15 0214 240 lb (108.863 kg)     Height 05/11/15 0214 '6\' 2"'$  (1.88 m)     Head Cir --      Peak Flow --      Pain Score 05/11/15 0215 10     Pain Loc --      Pain  Edu? --      Excl. in [place]? --     Constitutional: Alert and oriented. Well appearing and in mild acute distress. Eyes: Conjunctivae are normal. PERRL. EOMI. Head: Atraumatic. Nose: No congestion/rhinnorhea. Mouth/Throat: Mucous membranes are moist.  Oropharynx non-erythematous. Neck: No stridor.   Cardiovascular: Normal rate, regular rhythm. Grossly normal heart sounds.  Good peripheral circulation. Respiratory: Normal respiratory effort.  No retractions. Lungs CTAB. Gastrointestinal: Soft and moderately tender to palpation left lower quadrant without rebound or guarding. No distention. No abdominal bruits. No CVA tenderness. Musculoskeletal: No lower extremity tenderness nor edema.  No joint effusions. Neurologic:  Normal speech and language. No gross focal neurologic deficits are appreciated. No gait instability. Skin:  Skin is warm, dry and intact. No rash noted. Psychiatric: Mood and affect are normal. Speech and behavior are normal.  ____________________________________________   LABS (all labs ordered are listed, but only abnormal results are displayed)  Labs Reviewed  COMPREHENSIVE METABOLIC PANEL - Abnormal; Notable for the following:    Glucose, Bld 105 (*)    All other components within normal limits  CBC - Abnormal; Notable for the following:    Hemoglobin 12.3 (*)    HCT 38.3 (*)    MCV 75.3 (*)    MCH 24.2 (*)    RDW 17.8 (*)    Platelets 133 (*)    All other components within normal limits  PROTIME-INR - Abnormal; Notable for the following:    Prothrombin Time 26.4 (*)    All other components within normal limits  LIPASE, BLOOD  ETHANOL  URINALYSIS COMPLETEWITH MICROSCOPIC ([facility] ONLY)  URINE DRUG SCREEN, QUALITATIVE ([facility] ONLY)   ____________________________________________  EKG  None ____________________________________________  RADIOLOGY  CT renal stone study interpreted per Dr. Dorann Lodge: No urolithiasis, obstructive uropathy nor acute  intra-abdominal/ pelvic process.  Moderate amount of retained large bowel stool without bowel obstruction. ____________________________________________   PROCEDURES  Procedure(s) performed: None  Critical Care performed: No  ____________________________________________   INITIAL IMPRESSION / ASSESSMENT AND PLAN / ED COURSE  Pertinent labs & imaging results that were available during my care of the patient were reviewed by me and considered in my medical decision making (see chart for details).  64 year old male who presents with left lower quadrant abdominal pain radiating into left flank. Patient takes warfarin; will check screening lab work including  INR, administer IV analgesia, obtain CT renal study and reassess.  ----------------------------------------- 6:51 AM on 05/11/2015 -----------------------------------------  Bladder scan 212m. Patient awaiting urine specimen. Updated patient of CT results of moderate amount of retained stool. Anticipate discharge home with prescription for lactulose.   0752  Updated patient of urine results. Patient is due for his blood pressure medicines this morning and tells me he will do so once he returns home. Strict return precautions given. Patient verbalizes understanding and agrees with plan of care. ____________________________________________   FINAL CLINICAL IMPRESSION(S) / ED DIAGNOSES  Final diagnoses:  Left lower quadrant pain  Constipation, unspecified constipation type        JPaulette Blanch MD 05/11/15 [id number] JPaulette Blanch MD 05/11/15 [place] MD 05/11/15 [phone]

## 2015-05-11 NOTE — ED Notes (Signed)
Pt informed urine sample needed, pt verbalized understanding

## 2015-05-11 NOTE — ED Notes (Signed)
NAD noted at time of D/C. Pt taken to lobby via wheelchair. PT denies questions/concerns at this time.

## 2015-05-11 NOTE — ED Notes (Signed)
Pt. States lower left abdominal pain and lt. Flank pain x2 days.  Pt. Denies dysuria.  Pt. Denies n/v/d.  Pt. States increased pain while lying down.

## 2015-05-11 NOTE — ED Notes (Signed)
Pt states left lower quadrant pain for 2 days. Pt denies nausea, vomiting, diarrhea. Pt denies passage of flatus. Pt denies known fever.

## 2015-05-11 NOTE — ED Notes (Signed)
Pt states unable to give urine sample at this time 

## 2015-05-11 NOTE — ED Notes (Signed)
Attempted to in and out cath per dr. Beather Arbour verbal order, unable to pass catheter to bladder.  Per dr. Beather Arbour, performed bladder scan, which revealed >212m in bladder

## 2015-05-11 NOTE — ED Notes (Signed)
Patient transported to Ultrasound 

## 2015-05-11 NOTE — ED Notes (Signed)
MD notified pt's HR down to 36. No new orders received at this time.

## 2015-05-11 NOTE — Discharge Instructions (Signed)
1. Take laxative as needed for bowel movements (lactulose). 2. Return to the ER for worsening symptoms, persistent vomiting, difficulty breathing or other concerns.  Abdominal Pain, Adult Many things can cause abdominal pain. Usually, abdominal pain is not caused by a disease and will improve without treatment. It can often be observed and treated at home. Your health care provider will do a physical exam and possibly order blood tests and X-rays to help determine the seriousness of your pain. However, in many cases, more time must pass before a clear cause of the pain can be found. Before that point, your health care provider may not know if you need more testing or further treatment. HOME CARE INSTRUCTIONS Monitor your abdominal pain for any changes. The following actions may help to alleviate any discomfort you are experiencing:  Only take over-the-counter or prescription medicines as directed by your health care provider.  Do not take laxatives unless directed to do so by your health care provider.  Try a clear liquid diet (broth, tea, or water) as directed by your health care provider. Slowly move to a bland diet as tolerated. SEEK MEDICAL CARE IF:  You have unexplained abdominal pain.  You have abdominal pain associated with nausea or diarrhea.  You have pain when you urinate or have a bowel movement.  You experience abdominal pain that wakes you in the night.  You have abdominal pain that is worsened or improved by eating food.  You have abdominal pain that is worsened with eating fatty foods.  You have a fever. SEEK IMMEDIATE MEDICAL CARE IF:  Your pain does not go away within 2 hours.  You keep throwing up (vomiting).  Your pain is felt only in portions of the abdomen, such as the right side or the left lower portion of the abdomen.  You pass bloody or black tarry stools. MAKE SURE YOU:  Understand these instructions.  Will watch your condition.  Will get help  right away if you are not doing well or get worse.   This information is not intended to replace advice given to you by your health care provider. Make sure you discuss any questions you have with your health care provider.   Document Released: 02/10/2005 Document Revised: 01/22/2015 Document Reviewed: 01/10/2013 Elsevier Interactive Patient Education 2016 [employer].  Constipation, Adult Constipation is when a person has fewer than three bowel movements a week, has difficulty having a bowel movement, or has stools that are dry, hard, or larger than normal. As people grow older, constipation is more common. A low-fiber diet, not taking in enough fluids, and taking certain medicines may make constipation worse.  CAUSES   Certain medicines, such as antidepressants, pain medicine, iron supplements, antacids, and water pills.   Certain diseases, such as diabetes, irritable bowel syndrome (IBS), thyroid disease, or depression.   Not drinking enough water.   Not eating enough fiber-rich foods.   Stress or travel.   Lack of physical activity or exercise.   Ignoring the urge to have a bowel movement.   Using laxatives too much.  SIGNS AND SYMPTOMS   Having fewer than three bowel movements a week.   Straining to have a bowel movement.   Having stools that are hard, dry, or larger than normal.   Feeling full or bloated.   Pain in the lower abdomen.   Not feeling relief after having a bowel movement.  DIAGNOSIS  Your health care provider will take a medical history and perform a physical  exam. Further testing may be done for severe constipation. Some tests may include:  A barium enema X-ray to examine your rectum, colon, and, sometimes, your small intestine.   A sigmoidoscopy to examine your lower colon.   A colonoscopy to examine your entire colon. TREATMENT  Treatment will depend on the severity of your constipation and what is causing it. Some dietary  treatments include drinking more fluids and eating more fiber-rich foods. Lifestyle treatments may include regular exercise. If these diet and lifestyle recommendations do not help, your health care provider may recommend taking over-the-counter laxative medicines to help you have bowel movements. Prescription medicines may be prescribed if over-the-counter medicines do not work.  HOME CARE INSTRUCTIONS   Eat foods that have a lot of fiber, such as fruits, vegetables, whole grains, and beans.  Limit foods high in fat and processed sugars, such as french fries, hamburgers, cookies, candies, and soda.   A fiber supplement may be added to your diet if you cannot get enough fiber from foods.   Drink enough fluids to keep your urine clear or pale yellow.   Exercise regularly or as directed by your health care provider.   Go to the restroom when you have the urge to go. Do not hold it.   Only take over-the-counter or prescription medicines as directed by your health care provider. Do not take other medicines for constipation without talking to your health care provider first.  [place] IF:   You have bright red blood in your stool.   Your constipation lasts for more than 4 days or gets worse.   You have abdominal or rectal pain.   You have thin, pencil-like stools.   You have unexplained weight loss. MAKE SURE YOU:   Understand these instructions.  Will watch your condition.  Will get help right away if you are not doing well or get worse.   This information is not intended to replace advice given to you by your health care provider. Make sure you discuss any questions you have with your health care provider.   Document Released: 01/30/2004 Document Revised: 05/24/2014 Document Reviewed: 02/12/2013 Elsevier Interactive Patient Education [employer].

## 2015-05-13 DIAGNOSIS — Z79899 Other long term (current) drug therapy: Secondary | ICD-10-CM | POA: Insufficient documentation

## 2015-05-13 DIAGNOSIS — R1032 Left lower quadrant pain: Secondary | ICD-10-CM | POA: Insufficient documentation

## 2015-05-13 DIAGNOSIS — I10 Essential (primary) hypertension: Secondary | ICD-10-CM | POA: Insufficient documentation

## 2015-05-13 DIAGNOSIS — R140 Abdominal distension (gaseous): Secondary | ICD-10-CM | POA: Insufficient documentation

## 2015-05-13 DIAGNOSIS — Z7901 Long term (current) use of anticoagulants: Secondary | ICD-10-CM | POA: Insufficient documentation

## 2015-05-13 DIAGNOSIS — Z792 Long term (current) use of antibiotics: Secondary | ICD-10-CM | POA: Insufficient documentation

## 2015-05-13 DIAGNOSIS — F17210 Nicotine dependence, cigarettes, uncomplicated: Secondary | ICD-10-CM | POA: Insufficient documentation

## 2015-05-13 DIAGNOSIS — E119 Type 2 diabetes mellitus without complications: Secondary | ICD-10-CM | POA: Insufficient documentation

## 2015-05-13 DIAGNOSIS — R109 Unspecified abdominal pain: Secondary | ICD-10-CM | POA: Diagnosis present

## 2015-05-13 LAB — COMPREHENSIVE METABOLIC PANEL
ALBUMIN: 4.1 g/dL (ref 3.5–5.0)
ALK PHOS: 79 U/L (ref 38–126)
ALT: 21 U/L (ref 17–63)
AST: 33 U/L (ref 15–41)
Anion gap: 10 (ref 5–15)
BUN: 16 mg/dL (ref 6–20)
CALCIUM: 9.3 mg/dL (ref 8.9–10.3)
CHLORIDE: 104 mmol/L (ref 101–111)
CO2: 26 mmol/L (ref 22–32)
CREATININE: 0.85 mg/dL (ref 0.61–1.24)
GFR calc non Af Amer: >60 mL/min (ref >60)
GLUCOSE: 122 mg/dL — AB (ref 65–99)
Potassium: 3.5 mmol/L (ref 3.5–5.1)
SODIUM: 140 mmol/L (ref 135–145)
Total Bilirubin: 0.8 mg/dL (ref 0.3–1.2)
Total Protein: 7.4 g/dL (ref 6.5–8.1)

## 2015-05-13 LAB — CBC WITH DIFFERENTIAL/PLATELET
BASOS ABS: 0 10*3/uL (ref 0–0.1)
BASOS PCT: 1 %
EOS ABS: 0.1 10*3/uL (ref 0–0.7)
EOS PCT: 2 %
HCT: 37.7 % — ABNORMAL LOW (ref 40.0–52.0)
HEMOGLOBIN: 12.1 g/dL — AB (ref 13.0–18.0)
LYMPHS ABS: 1.8 10*3/uL (ref 1.0–3.6)
Lymphocytes Relative: 42 %
MCH: 24.1 pg — AB (ref 26.0–34.0)
MCHC: 32.1 g/dL (ref 32.0–36.0)
MCV: 75.1 fL — ABNORMAL LOW (ref 80.0–100.0)
Monocytes Absolute: 0.4 10*3/uL (ref 0.2–1.0)
Monocytes Relative: 9 %
NEUTROS PCT: 46 %
Neutro Abs: 2 10*3/uL (ref 1.4–6.5)
PLATELETS: 129 10*3/uL — AB (ref 150–440)
RBC: 5.02 MIL/uL (ref 4.40–5.90)
RDW: 18.4 % — ABNORMAL HIGH (ref 11.5–14.5)
WBC: 4.3 10*3/uL (ref 3.8–10.6)

## 2015-05-13 LAB — LIPASE, BLOOD: Lipase: 18 U/L (ref 11–51)

## 2015-05-13 NOTE — ED Notes (Signed)
Pt in with co left sided abd pain, was seen here 12/24 for the same and dc home with lactulose. States not better, no vomiting, or diarrhea.

## 2015-05-14 ENCOUNTER — Emergency Department
Admission: EM | Admit: 2015-05-14 | Discharge: 2015-05-14 | Disposition: A | Discharge status: Home / Self Care | Payer: Medicare Other | Attending: Emergency Medicine | Admitting: Emergency Medicine

## 2015-05-14 ENCOUNTER — Emergency Department: Payer: Medicare Other

## 2015-05-14 DIAGNOSIS — R1032 Left lower quadrant pain: Secondary | ICD-10-CM

## 2015-05-14 DIAGNOSIS — R140 Abdominal distension (gaseous): Secondary | ICD-10-CM

## 2015-05-14 LAB — URINALYSIS COMPLETE WITH MICROSCOPIC (ARMC ONLY)
BACTERIA UA: NONE SEEN
BILIRUBIN URINE: NEGATIVE
Glucose, UA: NEGATIVE mg/dL
HGB URINE DIPSTICK: NEGATIVE
Ketones, ur: NEGATIVE mg/dL
LEUKOCYTES UA: NEGATIVE
Nitrite: NEGATIVE
PH: 5 (ref 5.0–8.0)
Protein, ur: >500 mg/dL — AB
Specific Gravity, Urine: 1.027 (ref 1.005–1.030)

## 2015-05-14 LAB — LACTIC ACID, PLASMA: Lactic Acid, Venous: 1.2 mmol/L (ref 0.5–2.0)

## 2015-05-14 MED ORDER — DICYCLOMINE HCL 10 MG PO CAPS: 10.0000 mg | ORAL_CAPSULE | Freq: Three times a day (TID) | ORAL | Status: DC

## 2015-05-14 MED ORDER — IOHEXOL 240 MG/ML SOLN
25.0000 mL | Freq: Once | INTRAMUSCULAR | Status: AC | PRN
  Administered 2015-05-14: 25 mL via ORAL

## 2015-05-14 MED ORDER — OXYCODONE-ACETAMINOPHEN 5-325 MG PO TABS
1.0000 | ORAL_TABLET | Freq: Once | ORAL | Status: AC
  Administered 2015-05-14: 1 via ORAL
  Filled 2015-05-14: qty 1

## 2015-05-14 MED ORDER — SODIUM CHLORIDE 0.9 % IV BOLUS (SEPSIS)
1000.0000 mL | Freq: Once | INTRAVENOUS | Status: AC
  Administered 2015-05-14: 1000 mL via INTRAVENOUS

## 2015-05-14 MED ORDER — IOHEXOL 300 MG/ML  SOLN
100.0000 mL | Freq: Once | INTRAMUSCULAR | Status: AC | PRN
  Administered 2015-05-14: 100 mL via INTRAVENOUS

## 2015-05-14 MED ORDER — POLYETHYLENE GLYCOL 3350 17 G PO PACK: 17.0000 g | PACK | Freq: Every day | ORAL | Status: DC

## 2015-05-14 NOTE — ED Provider Notes (Signed)
[facility] Emergency Department Provider Note  ____________________________________________  Time seen: Approximately 136 AM  I have reviewed the triage vital signs and the nursing notes.   HISTORY  Chief Complaint Abdominal Pain    HPI [name] is a 64 y.o. male who comes into the hospital today with abdominal pain. The patient was here on Sunday with the same pain. He has pain in his left lower quadrant. The patient reports that he received a CT scan and was told that he was constipated. The patient received some lactulose for his constipation but he reports that the pain is still there. The patient as this pain is a 10 out of 10 in intensity and he has been hurting ever since. The patient taken all of the lactulose and reports he had one bowel movement on Monday, December 26. He reports that his abdomen has been getting swollen and distended. The patient reports that he tried to use the bathroom yesterday but did not get much out. The patient has had no nausea no vomiting no fever or chest pain. Given that he was still hurting he decided to come in for evaluation.   Past Medical History  Diagnosis Date  . History of deep vein thrombosis 2006  . Hypertension   . Gout current and history of  . History of tobacco abuse     has smoked for 50 years  . Cocaine abuse     + UDS on admission  . Stroke ([place])   . Atrial fibrillation ([place])   . Pulmonary embolism [facility])     Patient Active Problem List   Diagnosis Date Noted  . 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 Pneumonia 10/12/2014  . CVA (cerebral infarction) 04/26/2011  . Diabetes mellitus 04/26/2011  . 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 Anemia 04/26/2011  . DVT (deep venous thrombosis) ([place]) 04/26/2011    Past Surgical History  Procedure Laterality Date  . Vascular surgery      Stent in leg    Current Outpatient Rx  Name  Route  Sig   Dispense  Refill  . colchicine 0.6 MG tablet   Oral   Take 0.6 mg by mouth daily as needed (for gout).          . FUROSEMIDE PO   Oral   Take 1 tablet by mouth daily.         [name] lactulose (CHRONULAC) 10 GM/15ML solution   Oral   Take 30 mLs (20 g total) by mouth daily as needed for mild constipation.   120 mL   0   . metoprolol tartrate (LOPRESSOR) 25 MG tablet   Oral   Take 25 mg by mouth 2 (two) times daily.         . simvastatin (ZOCOR) 20 MG tablet   Oral   Take 20 mg by mouth daily.         [name] warfarin (COUMADIN) 2 MG tablet   Oral   Take 2 mg by mouth daily. Take with the '6mg'$  tablet for a total of '8mg'$ .         . warfarin (COUMADIN) 6 MG tablet   Oral   Take 6 mg by mouth daily. Every evening at 5 pm. Take with '2mg'$  tablet for a total of '8mg'$ .         . clindamycin (CLEOCIN) 300 MG capsule   Oral   Take 1 capsule (300 mg total) by  mouth 3 (three) times daily. Patient not taking: Reported on 05/14/2015   30 capsule   0   . dicyclomine (BENTYL) 10 MG capsule   Oral   Take 1 capsule (10 mg total) by mouth 3 (three) times daily before meals.   20 capsule   0   . levofloxacin (LEVAQUIN) 500 MG tablet   Oral   Take 1 tablet (500 mg total) by mouth daily.   5 tablet   0   . polyethylene glycol (MIRALAX) packet   Oral   Take 17 g by mouth daily.   14 each   0   . traMADol (ULTRAM) 50 MG tablet   Oral   Take 1 tablet (50 mg total) by mouth every 6 (six) hours as needed.   15 tablet   0     Allergies Review of patient's allergies indicates no known allergies.  Family History  Problem Relation Age of Onset  . Aneurysm Mother   . Diabetes Father     Social History Social History  Substance Use Topics  . Smoking status: Current Every Day Smoker -- 0.50 packs/day for 50 years    Types: Cigarettes  . Smokeless tobacco: Never Used  . Alcohol Use: No    Review of Systems Constitutional: No fever/chills Eyes: No visual changes. ENT: No  sore throat. Cardiovascular: Denies chest pain. Respiratory: Denies shortness of breath. Gastrointestinal:  abdominal pain and abdominal distention Genitourinary: Negative for dysuria. Musculoskeletal: Negative for back pain. Skin: Negative for rash. Neurological: Negative for headaches, focal weakness or numbness.  10-point ROS otherwise negative.  ____________________________________________   PHYSICAL EXAM:  VITAL SIGNS: ED Triage Vitals  Enc Vitals Group     BP 05/13/15 2237 155/94 mmHg     Pulse Rate 05/13/15 2236 65     Resp 05/13/15 2236 18     Temp 05/13/15 2236 97.6 F (36.4 C)     Temp Source 05/13/15 2236 Oral     SpO2 05/13/15 2236 98 %     Weight 05/13/15 2236 240 lb (108.863 kg)     Height 05/13/15 2236 '6\' 2"'$  (1.88 m)     Head Cir --      Peak Flow --      Pain Score 05/13/15 2237 10     Pain Loc --      Pain Edu? --      Excl. in [place]? --     Constitutional: Alert and oriented. Well appearing and in moderate distress. Eyes: Conjunctivae are normal. PERRL. EOMI. Head: Atraumatic. Nose: No congestion/rhinnorhea. Mouth/Throat: Mucous membranes are moist.  Oropharynx non-erythematous. Cardiovascular: Normal rate, regular rhythm. Grossly normal heart sounds.  Good peripheral circulation. Respiratory: Normal respiratory effort.  No retractions. Lungs CTAB. Gastrointestinal: Soft with left lower quadrant tenderness to palpation. Diffusely distended . Positive bowel sounds Musculoskeletal: No lower extremity tenderness nor edema.  . Neurologic:  Normal speech and language.  Skin:  Skin is warm, dry and intact.  Psychiatric: Mood and affect are normal.   ____________________________________________   LABS (all labs ordered are listed, but only abnormal results are displayed)  Labs Reviewed  CBC WITH DIFFERENTIAL/PLATELET - Abnormal; Notable for the following:    Hemoglobin 12.1 (*)    HCT 37.7 (*)    MCV 75.1 (*)    MCH 24.1 (*)    RDW 18.4 (*)     Platelets 129 (*)    All other components within normal limits  COMPREHENSIVE METABOLIC PANEL - Abnormal; Notable for  the following:    Glucose, Bld 122 (*)    All other components within normal limits  URINALYSIS COMPLETEWITH MICROSCOPIC ([facility] ONLY) - Abnormal; Notable for the following:    Color, Urine YELLOW (*)    APPearance CLEAR (*)    Protein, ur >500 (*)    Squamous Epithelial / LPF 0-5 (*)    All other components within normal limits  LIPASE, BLOOD  LACTIC ACID, PLASMA   ____________________________________________  EKG  None ____________________________________________  RADIOLOGY  KUB: Small and large bowel largely filled with air, small amount of stool noted in the colon, no free intra-abdominal air seen. No significant finding of radiographic constipation  CT abdomen and pelvis: No definite acute intra-abdominal or pelvic pathology identified ____________________________________________   PROCEDURES  Procedure(s) performed: None  Critical Care performed: No  ____________________________________________   INITIAL IMPRESSION / ASSESSMENT AND PLAN / ED COURSE  Pertinent labs & imaging results that were available during my care of the patient were reviewed by me and considered in my medical decision making (see chart for details).  This is a 64 year old male who comes into the hospital today with left lower quadrant abdominal pain. The patient had been given lactulose for constipation but he reports it does not help help his pain. I will do a KUB to evaluate for dilated loops of small bowel and reassess the patient once I received the results of his KUB.   Although the KUB shows gas distending the patient's bladder his CT scan is unremarkable. His wife reports that he has been passing copious amounts of gas here in the emergency department. The patient will be discharged home to follow-up with his primary care  physician. ____________________________________________   FINAL CLINICAL IMPRESSION(S) / ED DIAGNOSES  Final diagnoses:  Left lower quadrant pain  Abdominal distension      Loney Hering, MD 05/14/15 [phone]

## 2015-05-14 NOTE — ED Notes (Signed)
MD at bedside. 

## 2015-05-14 NOTE — Discharge Instructions (Signed)

## 2015-05-18 DIAGNOSIS — I639 Cerebral infarction, unspecified: Secondary | ICD-10-CM

## 2015-05-18 HISTORY — DX: Cerebral infarction, unspecified: I63.9

## 2015-07-28 ENCOUNTER — Ambulatory Visit (INDEPENDENT_AMBULATORY_CARE_PROVIDER_SITE_OTHER): Payer: Medicare Other | Admitting: Pulmonary Disease

## 2015-07-28 ENCOUNTER — Encounter: Payer: Self-pay | Admitting: Pulmonary Disease

## 2015-07-28 ENCOUNTER — Encounter (INDEPENDENT_AMBULATORY_CARE_PROVIDER_SITE_OTHER): Payer: Self-pay

## 2015-07-28 VITALS — BP 150/88 | HR 73 | Ht 74.0 in | Wt 266.0 lb

## 2015-07-28 DIAGNOSIS — Z720 Tobacco use: Secondary | ICD-10-CM | POA: Diagnosis not present

## 2015-07-28 DIAGNOSIS — R591 Generalized enlarged lymph nodes: Secondary | ICD-10-CM | POA: Diagnosis not present

## 2015-07-28 DIAGNOSIS — F17200 Nicotine dependence, unspecified, uncomplicated: Secondary | ICD-10-CM

## 2015-07-28 DIAGNOSIS — R599 Enlarged lymph nodes, unspecified: Secondary | ICD-10-CM

## 2015-07-28 NOTE — Patient Instructions (Signed)
Follow up in 4-6 weeks with chest Xray

## 2015-07-28 NOTE — Progress Notes (Signed)
PULMONARY CONSULT NOTE  Requesting MD/Service: Urban Gibson [facility] Date of initial consultation: 07/28/15 Reason for consultation: Lymphadenopathy noted incidentally on CT chest, smoker  PT PROFILE: 65 y.o. smoker with history of DVT, CAF, CVA referred for evaluation of  HPI:    Past Medical History  Diagnosis Date  . History of deep vein thrombosis 2006  . Hypertension   . Gout current and history of  . History of tobacco abuse     has smoked for 50 years  . Cocaine abuse     + UDS on admission  . Stroke ([place])   . Atrial fibrillation ([place])   . Pulmonary embolism [facility])     Past Surgical History  Procedure Laterality Date  . Vascular surgery      Stent in leg    MEDICATIONS: I have reviewed all medications and confirmed regimen as documented  Social History   Social History  . Marital Status: Single    Spouse Name: N/A  . Number of Children: N/A  . Years of Education: N/A   Occupational History  . Not on file.   Social History Main Topics  . Smoking status: Current Every Day Smoker -- 0.50 packs/day for 50 years    Types: Cigarettes  . Smokeless tobacco: Never Used  . Alcohol Use: No  . Drug Use: No     Comment: Urinainary drug screen + cocaine on admit  . Sexual Activity: Not on file   Other Topics Concern  . Not on file   Social History Narrative   Lives at home by himself. Girlfriend lives close by    Family History  Problem Relation Age of Onset  . Aneurysm Mother   . Diabetes Father     ROS: No fever, myalgias/arthralgias, unexplained weight loss or weight gain No new focal weakness or sensory deficits No otalgia, hearing loss, visual changes, nasal and sinus symptoms, mouth and throat problems No neck pain or adenopathy No abdominal pain, N/V/D, diarrhea, change in bowel pattern No dysuria, change in urinary pattern No LE edema or calf tenderness   Filed Vitals:   07/28/15 1046  BP: 150/88  Pulse: 73   Height: '6\' 2"'$  (1.88 m)  Weight: 266 lb (120.657 kg)  SpO2: 98%     EXAM:  Gen: WDWN, No overt respiratory distress HEENT: NCAT, oropharynx normal Neck: Supple without LAN, thyromegaly, JVD Lungs: breath sounds full without wheezes or other adventitious sounds Cardiovascular: IRIR, rate controlled, no murmurs noted Abdomen: Soft, nontender, normal BS Ext: without clubbing, cyanosis, edema Neuro: CNs grossly intact,RLE weakness - mild, sensory intact, DTRs symmetric Skin: Limited exam, no lesions noted  DATA:   BMP Latest Ref Rng 05/13/2015 05/11/2015 10/14/2014  Glucose 65 - 99 mg/dL 122(H) 105(H) 109(H)  BUN 6 - 20 mg/dL '16 14 13  '$ Creatinine 0.61 - 1.24 mg/dL 0.85 0.91 0.75  Sodium 135 - 145 mmol/L 140 139 135  Potassium 3.5 - 5.1 mmol/L 3.5 3.7 3.5  Chloride 101 - 111 mmol/L 104 105 102  CO2 22 - 32 mmol/L '26 27 25  '$ Calcium 8.9 - 10.3 mg/dL 9.3 8.9 8.1(L)    CBC Latest Ref Rng 05/13/2015 05/11/2015 10/13/2014  WBC 3.8 - 10.6 K/uL 4.3 4.5 7.9  Hemoglobin 13.0 - 18.0 g/dL 12.1(L) 12.3(L) 11.7(L)  Hematocrit 40.0 - 52.0 % 37.7(L) 38.3(L) 35.9(L)  Platelets 150 - 440 K/uL 129(L) 133(L) 120(L)    CXR 10/11/14: Equivocal foci of increased density at the right lung base and right  upper lobe. Cannot exclude early pneumonia. Given the patient's symptoms, recommend radiographic follow-up after appropriate antibiotic therapy. If these areas persist, CT followup would be suggested to exclude 1 or more pulmonary nodules    CT chest  10/12/14: No PE noted. Mild lymphadenopathy is identified with pretracheal node measuring 1.5 cm image 60. Subcarinal lymphadenopathy measures 2.0 cm. Subpleural areas of scarring or atelectasis are noted. Dense consolidation of the RLL with mild bilateral lower lobe bronchial wall thickening. 1.5 cm oblong posterior-medial pleural based nodule.  CTAP 05/14/15: A 1.5 cm right lung base subpleural nodule similar to the study dating back to 10/12/2014. RLL  consolidation resolved  IMPRESSION:   Smoker Incidental finding of mediastinal LAN on CT chest 10/12/14- this was in setting of what appears to be RLL pneumonia and likely represent reactive lymph nodes Incidental findings of 1.5 cm posterior-medial pleural based pulmonary nodule, stable over 7 months duration based on CTAP performed 05/14/15  PLAN:  Counseled re: smoking cessation ROV 4-6 weeks with CXR. Will need repeat CT chest in next few months to ensure stability of LAN and lung nodule and ultimately these will need to be followed over 2 yrs duration to ensure that they are non-malignant   Wilhelmina Mcardle, MD [facility] Critical Care Medicine

## 2015-09-05 ENCOUNTER — Ambulatory Visit: Payer: Medicare Other | Admitting: Pulmonary Disease

## 2015-09-14 ENCOUNTER — Emergency Department
Admission: EM | Admit: 2015-09-14 | Discharge: 2015-09-14 | Disposition: A | Discharge status: Home / Self Care | Payer: Medicare Other | Attending: Emergency Medicine | Admitting: Emergency Medicine

## 2015-09-14 ENCOUNTER — Encounter: Payer: Self-pay | Admitting: Emergency Medicine

## 2015-09-14 ENCOUNTER — Emergency Department: Payer: Medicare Other

## 2015-09-14 DIAGNOSIS — Z7982 Long term (current) use of aspirin: Secondary | ICD-10-CM | POA: Diagnosis not present

## 2015-09-14 DIAGNOSIS — M25532 Pain in left wrist: Secondary | ICD-10-CM | POA: Insufficient documentation

## 2015-09-14 DIAGNOSIS — M109 Gout, unspecified: Secondary | ICD-10-CM | POA: Diagnosis not present

## 2015-09-14 DIAGNOSIS — I10 Essential (primary) hypertension: Secondary | ICD-10-CM | POA: Diagnosis not present

## 2015-09-14 DIAGNOSIS — Z8673 Personal history of transient ischemic attack (TIA), and cerebral infarction without residual deficits: Secondary | ICD-10-CM | POA: Insufficient documentation

## 2015-09-14 DIAGNOSIS — F17210 Nicotine dependence, cigarettes, uncomplicated: Secondary | ICD-10-CM | POA: Insufficient documentation

## 2015-09-14 MED ORDER — OXYCODONE-ACETAMINOPHEN 5-325 MG PO TABS: 1.0000 | ORAL_TABLET | ORAL | Status: DC | PRN

## 2015-09-14 MED ORDER — OXYCODONE-ACETAMINOPHEN 5-325 MG PO TABS
1.0000 | ORAL_TABLET | Freq: Once | ORAL | Status: AC
  Administered 2015-09-14: 1 via ORAL
  Filled 2015-09-14: qty 1

## 2015-09-14 NOTE — Discharge Instructions (Signed)
1. You may take pain medicine as needed (Percocet #15). 2. You may remove Velcro wrist splint as needed. 3. Return to the ER for worsening symptoms, persistent vomiting, difficult breathing or other concerns.  Gout Gout is an inflammatory arthritis caused by a buildup of uric acid crystals in the joints. Uric acid is a chemical that is normally present in the blood. When the level of uric acid in the blood is too high it can form crystals that deposit in your joints and tissues. This causes joint redness, soreness, and swelling (inflammation). Repeat attacks are common. Over time, uric acid crystals can form into masses (tophi) near a joint, destroying bone and causing disfigurement. Gout is treatable and often preventable. CAUSES  The disease begins with elevated levels of uric acid in the blood. Uric acid is produced by your body when it breaks down a naturally found substance called purines. Certain foods you eat, such as meats and fish, contain high amounts of purines. Causes of an elevated uric acid level include:  Being passed down from parent to child (heredity).  Diseases that cause increased uric acid production (such as obesity, psoriasis, and certain cancers).  Excessive alcohol use.  Diet, especially diets rich in meat and seafood.  Medicines, including certain cancer-fighting medicines (chemotherapy), water pills (diuretics), and aspirin.  Chronic kidney disease. The kidneys are no longer able to remove uric acid well.  Problems with metabolism. Conditions strongly associated with gout include:  Obesity.  High blood pressure.  High cholesterol.  Diabetes. Not everyone with elevated uric acid levels gets gout. It is not understood why some people get gout and others do not. Surgery, joint injury, and eating too much of certain foods are some of the factors that can lead to gout attacks. SYMPTOMS   An attack of gout comes on quickly. It causes intense pain with redness,  swelling, and warmth in a joint.  Fever can occur.  Often, only one joint is involved. Certain joints are more commonly involved:  Base of the big toe.  Knee.  Ankle.  Wrist.  Finger. Without treatment, an attack usually goes away in a few days to weeks. Between attacks, you usually will not have symptoms, which is different from many other forms of arthritis. DIAGNOSIS  Your caregiver will suspect gout based on your symptoms and exam. In some cases, tests may be recommended. The tests may include:  Blood tests.  Urine tests.  X-rays.  Joint fluid exam. This exam requires a needle to remove fluid from the joint (arthrocentesis). Using a microscope, gout is confirmed when uric acid crystals are seen in the joint fluid. TREATMENT  There are two phases to gout treatment: treating the sudden onset (acute) attack and preventing attacks (prophylaxis).  Treatment of an Acute Attack.  Medicines are used. These include anti-inflammatory medicines or steroid medicines.  An injection of steroid medicine into the affected joint is sometimes necessary.  The painful joint is rested. Movement can worsen the arthritis.  You may use warm or cold treatments on painful joints, depending which works best for you.  Treatment to Prevent Attacks.  If you suffer from frequent gout attacks, your caregiver may advise preventive medicine. These medicines are started after the acute attack subsides. These medicines either help your kidneys eliminate uric acid from your body or decrease your uric acid production. You may need to stay on these medicines for a very long time.  The early phase of treatment with preventive medicine can be associated  with an increase in acute gout attacks. For this reason, during the first few months of treatment, your caregiver may also advise you to take medicines usually used for acute gout treatment. Be sure you understand your caregiver's directions. Your caregiver may  make several adjustments to your medicine dose before these medicines are effective.  Discuss dietary treatment with your caregiver or dietitian. Alcohol and drinks high in sugar and fructose and foods such as meat, poultry, and seafood can increase uric acid levels. Your caregiver or dietitian can advise you on drinks and foods that should be limited. HOME CARE INSTRUCTIONS   Do not take aspirin to relieve pain. This raises uric acid levels.  Only take over-the-counter or prescription medicines for pain, discomfort, or fever as directed by your caregiver.  Rest the joint as much as possible. When in bed, keep sheets and blankets off painful areas.  Keep the affected joint raised (elevated).  Apply warm or cold treatments to painful joints. Use of warm or cold treatments depends on which works best for you.  Use crutches if the painful joint is in your leg.  Drink enough fluids to keep your urine clear or pale yellow. This helps your body get rid of uric acid. Limit alcohol, sugary drinks, and fructose drinks.  Follow your dietary instructions. Pay careful attention to the amount of protein you eat. Your daily diet should emphasize fruits, vegetables, whole grains, and fat-free or low-fat milk products. Discuss the use of coffee, vitamin C, and cherries with your caregiver or dietitian. These may be helpful in lowering uric acid levels.  Maintain a healthy body weight. SEEK MEDICAL CARE IF:   You develop diarrhea, vomiting, or any side effects from medicines.  You do not feel better in 24 hours, or you are getting worse. SEEK IMMEDIATE MEDICAL CARE IF:   Your joint becomes suddenly more tender, and you have chills or a fever. MAKE SURE YOU:   Understand these instructions.  Will watch your condition.  Will get help right away if you are not doing well or get worse.   This information is not intended to replace advice given to you by your health care provider. Make sure you  discuss any questions you have with your health care provider.   Document Released: 04/30/2000 Document Revised: 05/24/2014 Document Reviewed: 12/15/2011 Elsevier Interactive Patient Education [employer].

## 2015-09-14 NOTE — ED Provider Notes (Signed)
[facility] Emergency Department Provider Note   ____________________________________________  Time seen: Approximately 2:26 AM  I have reviewed the triage vital signs and the nursing notes.   HISTORY  Chief Complaint Joint Swelling    HPI [name] is a 65 y.o. male who presents to the ED from home with a chief complaint of left wrist pain. Patient has a history of gout and reports pain x 1 day. Has had gout in his right wrist previously and states this feels similar. He was unable to get his colchicine refilled at the pharmacy secondary to requiring his PCPs approval.Denies associated fever, chills, chest pain, shortness of breath, abdominal pain, nausea, vomiting, diarrhea. Denies recent travel or trauma. Nothing makes his pain better. Movement makes his pain worse.   Past Medical History  Diagnosis Date  . History of deep vein thrombosis 2006  . Hypertension   . Gout current and history of  . History of tobacco abuse     has smoked for 50 years  . Cocaine abuse     + UDS on admission  . Stroke ([place])   . Atrial fibrillation ([place])   . Pulmonary embolism [facility])     Patient Active Problem List   Diagnosis Date Noted  . 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 Pneumonia 10/12/2014  . CVA (cerebral infarction) 04/26/2011  . Diabetes mellitus 04/26/2011  . 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 Anemia 04/26/2011  . DVT (deep venous thrombosis) ([place]) 04/26/2011    Past Surgical History  Procedure Laterality Date  . Vascular surgery      Stent in leg    Current Outpatient Rx  Name  Route  Sig  Dispense  Refill  . aspirin 81 MG tablet   Oral   Take 81 mg by mouth daily.         . colchicine 0.6 MG tablet   Oral   Take 0.6 mg by mouth daily as needed (for gout).          . EXPIRED: dicyclomine (BENTYL) 10 MG capsule   Oral   Take 1 capsule (10 mg total) by mouth 3 (three) times  daily before meals.   20 capsule   0   . FUROSEMIDE PO   Oral   Take 1 tablet by mouth daily.         [name] lactulose (CHRONULAC) 10 GM/15ML solution   Oral   Take 30 mLs (20 g total) by mouth daily as needed for mild constipation.   120 mL   0   . metoprolol tartrate (LOPRESSOR) 25 MG tablet   Oral   Take 25 mg by mouth 2 (two) times daily.         . polyethylene glycol (MIRALAX) packet   Oral   Take 17 g by mouth daily.   14 each   0   . simvastatin (ZOCOR) 20 MG tablet   Oral   Take 20 mg by mouth daily.         [name] warfarin (COUMADIN) 2 MG tablet   Oral   Take 2 mg by mouth daily. Take with the '6mg'$  tablet for a total of '8mg'$ .         . warfarin (COUMADIN) 6 MG tablet   Oral   Take 6 mg by mouth daily. Every evening at 5 pm. Take with '2mg'$  tablet for a total of '8mg'$ .  Allergies Review of patient's allergies indicates no known allergies.  Family History  Problem Relation Age of Onset  . Aneurysm Mother   . Diabetes Father     Social History Social History  Substance Use Topics  . Smoking status: Current Every Day Smoker -- 0.50 packs/day for 50 years    Types: Cigarettes  . Smokeless tobacco: Never Used  . Alcohol Use: No    Review of Systems  Constitutional: No fever/chills. Eyes: No visual changes. ENT: No sore throat. Cardiovascular: Denies chest pain. Respiratory: Denies shortness of breath. Gastrointestinal: No abdominal pain.  No nausea, no vomiting.  No diarrhea.  No constipation. Genitourinary: Negative for dysuria. Musculoskeletal: Positive for left wrist pain. Negative for back pain. Skin: Negative for rash. Neurological: Negative for headaches, focal weakness or numbness.  10-point ROS otherwise negative.  ____________________________________________   PHYSICAL EXAM:  VITAL SIGNS: ED Triage Vitals  Enc Vitals Group     BP 09/14/15 0037 143/80 mmHg     Pulse Rate 09/14/15 0036 85     Resp 09/14/15 0036 18     Temp  09/14/15 0036 98 F (36.7 C)     Temp src --      SpO2 09/14/15 0036 100 %     Weight 09/14/15 0036 235 lb (106.595 kg)     Height 09/14/15 0036 '6\' 2"'$  (1.88 m)     Head Cir --      Peak Flow --      Pain Score 09/14/15 0037 10     Pain Loc --      Pain Edu? --      Excl. in [place]? --     Constitutional: Asleep, easily awakened for exam. Alert and oriented. Well appearing and in no acute distress. Eyes: Conjunctivae are normal. PERRL. EOMI. Head: Atraumatic. Nose: No congestion/rhinnorhea. Mouth/Throat: Mucous membranes are moist.  Oropharynx non-erythematous. Neck: No stridor.   Cardiovascular: Normal rate, regular rhythm. Grossly normal heart sounds.  Good peripheral circulation. Respiratory: Normal respiratory effort.  No retractions. Lungs CTAB. Gastrointestinal: Soft and nontender. No distention. No abdominal bruits. No CVA tenderness. Musculoskeletal: Left dorsal wrist with moderate swelling and warmth. Limited range of motion secondary to pain. 2+ radial pulses. Brisk, less than 5 second capillary refill. Symmetrical motor strength and sensation. No lower extremity tenderness nor edema.  No joint effusions. Neurologic:  Normal speech and language. No gross focal neurologic deficits are appreciated. No gait instability. Skin:  Skin is warm, dry and intact. No rash noted. Psychiatric: Mood and affect are normal. Speech and behavior are normal.  ____________________________________________   LABS (all labs ordered are listed, but only abnormal results are displayed)  Labs Reviewed - No data to display ____________________________________________  EKG  None ____________________________________________  RADIOLOGY  Left wrist complete interpreted per Dr. Radene Knee: No evidence of fracture or dislocation. ____________________________________________   PROCEDURES  Procedure(s) performed: None  Critical Care performed:  No  ____________________________________________   INITIAL IMPRESSION / ASSESSMENT AND PLAN / ED COURSE  Pertinent labs & imaging results that were available during my care of the patient were reviewed by me and considered in my medical decision making (see chart for details).  65 year old male with a history of gout who presents with left wrist warmth and swelling Consistent with gout. Low suspicion for septic joint. Plan for prescriptions for analgesia and follow-up with orthopedics. Strict return precautions given. Patient verbalizes understanding and agrees with plan of care. ____________________________________________   FINAL CLINICAL IMPRESSION(S) / ED DIAGNOSES  Final diagnoses:  Acute gout of left wrist, unspecified cause  Wrist pain, acute, left      NEW MEDICATIONS STARTED DURING THIS VISIT:  New Prescriptions   No medications on file     Note:  This document was prepared using Dragon voice recognition software and may include unintentional dictation errors.    Paulette Blanch, MD 09/14/15 [phone]

## 2015-09-14 NOTE — ED Notes (Signed)
Patient reports that his left wrist started swelling yesterday. Patient denies any injury.

## 2015-09-16 ENCOUNTER — Encounter: Payer: Self-pay | Admitting: Pulmonary Disease

## 2015-09-16 ENCOUNTER — Ambulatory Visit (INDEPENDENT_AMBULATORY_CARE_PROVIDER_SITE_OTHER): Payer: Medicare Other | Admitting: Pulmonary Disease

## 2015-09-16 VITALS — BP 144/86 | HR 72 | Ht 74.0 in | Wt 241.0 lb

## 2015-09-16 DIAGNOSIS — R591 Generalized enlarged lymph nodes: Secondary | ICD-10-CM

## 2015-09-16 DIAGNOSIS — R911 Solitary pulmonary nodule: Secondary | ICD-10-CM | POA: Diagnosis not present

## 2015-09-16 DIAGNOSIS — R0600 Dyspnea, unspecified: Secondary | ICD-10-CM

## 2015-09-16 DIAGNOSIS — F17200 Nicotine dependence, unspecified, uncomplicated: Secondary | ICD-10-CM

## 2015-09-16 DIAGNOSIS — Z720 Tobacco use: Secondary | ICD-10-CM | POA: Diagnosis not present

## 2015-09-16 DIAGNOSIS — R599 Enlarged lymph nodes, unspecified: Secondary | ICD-10-CM

## 2015-09-17 NOTE — Progress Notes (Signed)
PULMONARY CONSULT NOTE  Requesting MD/Service: Urban Gibson [facility] Date of initial consultation: 07/28/15 Reason for consultation: Lymphadenopathy noted incidentally on CT chest, smoker  PT PROFILE: 65 y.o. smoker with history of DVT, CAF, CVA referred for evaluation of incidental finding of mediastinal LAN and RLL nodule.  INITIAL IMPRESSION: Smoker, lung nodule, mediastinal lymphadenopathy. PLAN: counseled re: smoking cessation. ROV 4-6 weeks with CXR. CT chest in future  ROV 09/16/15: continued smoking of 5-6 per day. Counseled re: smoking cessation. CT chest ordered. ROV 3 months. Will call him re: CT scan findings  SUBJ:  No new complaints. Denies weight loss and other constitutional symptoms, CP, fever, purulent sputum, hemoptysis, LE edema and calf tenderness :  OBJ: Filed Vitals:   09/16/15 0847  BP: 144/86  Pulse: 72  Height: '6\' 2"'$  (1.88 m)  Weight: 241 lb (109.317 kg)  SpO2: 95%     EXAM:  Gen: WDWN, No overt respiratory distress HEENT: NCAT, oropharynx normal Neck: Supple without LAN, thyromegaly, JVD Lungs: breath sounds full without wheezes or other adventitious sounds Cardiovascular: IRIR, rate controlled, no murmurs noted Abdomen: Soft, nontender, normal BS Ext: without clubbing, cyanosis, edema Neuro: grossly intact  DATA:   BMP Latest Ref Rng 05/13/2015 05/11/2015 10/14/2014  Glucose 65 - 99 mg/dL 122(H) 105(H) 109(H)  BUN 6 - 20 mg/dL '16 14 13  '$ Creatinine 0.61 - 1.24 mg/dL 0.85 0.91 0.75  Sodium 135 - 145 mmol/L 140 139 135  Potassium 3.5 - 5.1 mmol/L 3.5 3.7 3.5  Chloride 101 - 111 mmol/L 104 105 102  CO2 22 - 32 mmol/L '26 27 25  '$ Calcium 8.9 - 10.3 mg/dL 9.3 8.9 8.1(L)    CBC Latest Ref Rng 05/13/2015 05/11/2015 10/13/2014  WBC 3.8 - 10.6 K/uL 4.3 4.5 7.9  Hemoglobin 13.0 - 18.0 g/dL 12.1(L) 12.3(L) 11.7(L)  Hematocrit 40.0 - 52.0 % 37.7(L) 38.3(L) 35.9(L)  Platelets 150 - 440 K/uL 129(L) 133(L) 120(L)   DATA: No new  lab or Xray data  IMPRESSION:   Smoker Incidental finding of mediastinal LAN on CT chest 10/12/14- this was in setting of what appears to be RLL pneumonia and likely represent reactive lymph nodes Incidental findings of 1.5 cm posterior-medial pleural based pulmonary nodule, stable over 7 months duration based on CTAP performed 05/14/15  PLAN:  1) Again counseled re: smoking cessation. We noted that he has a particular urge to smoke after certain triggers such as upon first awakening, after drinking coffee and after meals. I suggested that he work on breaking these triggers first  2) CT chest with IVC ordered to follow lung nodule and LAN  3) ROV 3 months  Merton Border, MD PCCM service Mobile [phone] Pager [phone] 09/17/2015

## 2015-09-26 ENCOUNTER — Ambulatory Visit: Admission: RE | Admit: 2015-09-26 | Payer: Medicare Other | Source: Ambulatory Visit

## 2015-10-29 ENCOUNTER — Emergency Department
Admission: EM | Admit: 2015-10-29 | Discharge: 2015-10-29 | Disposition: A | Discharge status: Home / Self Care | Payer: Medicare Other | Attending: Emergency Medicine | Admitting: Emergency Medicine

## 2015-10-29 DIAGNOSIS — E119 Type 2 diabetes mellitus without complications: Secondary | ICD-10-CM | POA: Diagnosis not present

## 2015-10-29 DIAGNOSIS — Z8673 Personal history of transient ischemic attack (TIA), and cerebral infarction without residual deficits: Secondary | ICD-10-CM | POA: Diagnosis not present

## 2015-10-29 DIAGNOSIS — I4891 Unspecified atrial fibrillation: Secondary | ICD-10-CM | POA: Diagnosis not present

## 2015-10-29 DIAGNOSIS — Z7901 Long term (current) use of anticoagulants: Secondary | ICD-10-CM | POA: Insufficient documentation

## 2015-10-29 DIAGNOSIS — I10 Essential (primary) hypertension: Secondary | ICD-10-CM | POA: Insufficient documentation

## 2015-10-29 DIAGNOSIS — F17210 Nicotine dependence, cigarettes, uncomplicated: Secondary | ICD-10-CM | POA: Diagnosis not present

## 2015-10-29 DIAGNOSIS — M791 Myalgia: Secondary | ICD-10-CM | POA: Diagnosis not present

## 2015-10-29 DIAGNOSIS — Z7982 Long term (current) use of aspirin: Secondary | ICD-10-CM | POA: Insufficient documentation

## 2015-10-29 DIAGNOSIS — Z86718 Personal history of other venous thrombosis and embolism: Secondary | ICD-10-CM | POA: Insufficient documentation

## 2015-10-29 DIAGNOSIS — M7918 Myalgia, other site: Secondary | ICD-10-CM

## 2015-10-29 LAB — CBC WITH DIFFERENTIAL/PLATELET
Basophils Absolute: 0 10*3/uL (ref 0–0.1)
EOS ABS: 0.1 10*3/uL (ref 0–0.7)
HEMATOCRIT: 36.3 % — AB (ref 40.0–52.0)
Hemoglobin: 11.7 g/dL — ABNORMAL LOW (ref 13.0–18.0)
Lymphocytes Relative: 41 %
Lymphs Abs: 1.2 10*3/uL (ref 1.0–3.6)
MCH: 24.5 pg — ABNORMAL LOW (ref 26.0–34.0)
MCHC: 32.1 g/dL (ref 32.0–36.0)
MCV: 76.4 fL — ABNORMAL LOW (ref 80.0–100.0)
MONO ABS: 0.3 10*3/uL (ref 0.2–1.0)
Neutro Abs: 1.4 10*3/uL (ref 1.4–6.5)
Neutrophils Relative %: 46 %
PLATELETS: 121 10*3/uL — AB (ref 150–440)
RBC: 4.76 MIL/uL (ref 4.40–5.90)
RDW: 17.3 % — AB (ref 11.5–14.5)
WBC: 3 10*3/uL — ABNORMAL LOW (ref 3.8–10.6)

## 2015-10-29 LAB — URINALYSIS COMPLETE WITH MICROSCOPIC (ARMC ONLY)
BACTERIA UA: NONE SEEN
BILIRUBIN URINE: NEGATIVE
GLUCOSE, UA: NEGATIVE mg/dL
KETONES UR: NEGATIVE mg/dL
Leukocytes, UA: NEGATIVE
NITRITE: NEGATIVE
PH: 6 (ref 5.0–8.0)
Protein, ur: 100 mg/dL — AB
SPECIFIC GRAVITY, URINE: 1.02 (ref 1.005–1.030)

## 2015-10-29 LAB — BASIC METABOLIC PANEL
Anion gap: 7 (ref 5–15)
BUN: 14 mg/dL (ref 6–20)
CALCIUM: 8.8 mg/dL — AB (ref 8.9–10.3)
CO2: 26 mmol/L (ref 22–32)
CREATININE: 0.82 mg/dL (ref 0.61–1.24)
Chloride: 105 mmol/L (ref 101–111)
GFR calc Af Amer: >60 mL/min (ref >60)
GFR calc non Af Amer: >60 mL/min (ref >60)
GLUCOSE: 125 mg/dL — AB (ref 65–99)
Potassium: 3.6 mmol/L (ref 3.5–5.1)
Sodium: 138 mmol/L (ref 135–145)

## 2015-10-29 NOTE — ED Notes (Signed)
States he developed pain to left lateral rib area about 2 days ago  Denies any fall n/v/d or fever

## 2015-10-29 NOTE — Discharge Instructions (Signed)
Musculoskeletal Pain Musculoskeletal pain is muscle and boney aches and pains. These pains can occur in any part of the body. Your caregiver may treat you without knowing the cause of the pain. They may treat you if blood or urine tests, X-rays, and other tests were normal.  CAUSES There is often not a definite cause or reason for these pains. These pains may be caused by a type of germ (virus). The discomfort may also come from overuse. Overuse includes working out too hard when your body is not fit. Boney aches also come from weather changes. Bone is sensitive to atmospheric pressure changes. HOME CARE INSTRUCTIONS   Ask when your test results will be ready. Make sure you get your test results.  Only take over-the-counter or prescription medicines for pain, discomfort, or fever as directed by your caregiver. If you were given medications for your condition, do not drive, operate machinery or power tools, or sign legal documents for 24 hours. Do not drink alcohol. Do not take sleeping pills or other medications that may interfere with treatment.  Continue all activities unless the activities cause more pain. When the pain lessens, slowly resume normal activities. Gradually increase the intensity and duration of the activities or exercise.  During periods of severe pain, bed rest may be helpful. Lay or sit in any position that is comfortable.  Putting ice on the injured area.  Put ice in a bag.  Place a towel between your skin and the bag.  Leave the ice on for 15 to 20 minutes, 3 to 4 times a day.  Follow up with your caregiver for continued problems and no reason can be found for the pain. If the pain becomes worse or does not go away, it may be necessary to repeat tests or do additional testing. Your caregiver may need to look further for a possible cause. SEEK IMMEDIATE MEDICAL CARE IF:  You have pain that is getting worse and is not relieved by medications.  You develop chest pain  that is associated with shortness or breath, sweating, feeling sick to your stomach (nauseous), or throw up (vomit).  Your pain becomes localized to the abdomen.  You develop any new symptoms that seem different or that concern you. MAKE SURE YOU:   Understand these instructions.  Will watch your condition.  Will get help right away if you are not doing well or get worse.   This information is not intended to replace advice given to you by your health care provider. Make sure you discuss any questions you have with your health care provider.   Document Released: 05/03/2005 Document Revised: 07/26/2011 Document Reviewed: 01/05/2013 Elsevier Interactive Patient Education 2016 [employer]  Cryotherapy Cryotherapy is when you put ice on your injury. Ice helps lessen pain and puffiness (swelling) after an injury. Ice works the best when you start using it in the first 24 to 48 hours after an injury. HOME CARE  Put a dry or damp towel between the ice pack and your skin.  You may press gently on the ice pack.  Leave the ice on for no more than 10 to 20 minutes at a time.  Check your skin after 5 minutes to make sure your skin is okay.  Rest at least 20 minutes between ice pack uses.  Stop using ice when your skin loses feeling (numbness).  Do not use ice on someone who cannot tell you when it hurts. This includes small children and people with memory problems (  dementia). GET HELP RIGHT AWAY IF:  You have white spots on your skin.  Your skin turns blue or pale.  Your skin feels waxy or hard.  Your puffiness gets worse. MAKE SURE YOU:   Understand these instructions.  Will watch your condition.  Will get help right away if you are not doing well or get worse.   This information is not intended to replace advice given to you by your health care provider. Make sure you discuss any questions you have with your health care provider.   Document Released: 10/20/2007 Document  Revised: 07/26/2011 Document Reviewed: 12/24/2010 Elsevier Interactive Patient Education [employer].

## 2015-10-29 NOTE — ED Provider Notes (Signed)
[facility] Emergency Department Provider Note  ____________________________________________  Time seen: Approximately 10:22 AM  I have reviewed the triage vital signs and the nursing notes.   HISTORY  Chief Complaint Abdominal Pain    HPI [name] is a 65 y.o. male , NAD, presents to the emergency department with 2-3 day history of left side pain. States he was just sitting when the pain began. States is worse with movement and improves with resting. Denies any injury or trauma. Has not had any neck pain, back pain, abdominal pain, nausea, vomiting, diarrhea, dysuria, hematuria, chest pain, shortness breath, wheezing. Has not had any numbness, weakness, tingling. Denies any fevers, chills, body aches. Rashes. Has taken ibuprofen with mild relief of pain but no resolution of pain.   Past Medical History  Diagnosis Date  . History of deep vein thrombosis 2006  . Hypertension   . Gout current and history of  . History of tobacco abuse     has smoked for 50 years  . Cocaine abuse     + UDS on admission  . Stroke ([place])   . Atrial fibrillation ([place])   . Pulmonary embolism [facility])     Patient Active Problem List   Diagnosis Date Noted  . 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 Pneumonia 10/12/2014  . CVA (cerebral infarction) 04/26/2011  . Diabetes mellitus 04/26/2011  . 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 Anemia 04/26/2011  . DVT (deep venous thrombosis) ([place]) 04/26/2011    Past Surgical History  Procedure Laterality Date  . Vascular surgery      Stent in leg    Current Outpatient Rx  Name  Route  Sig  Dispense  Refill  . aspirin 81 MG tablet   Oral   Take 81 mg by mouth daily.         . colchicine 0.6 MG tablet   Oral   Take 0.6 mg by mouth daily as needed (for gout).          . EXPIRED: dicyclomine (BENTYL) 10 MG capsule   Oral   Take 1 capsule (10 mg total) by mouth 3 (three)  times daily before meals.   20 capsule   0   . furosemide (LASIX) 40 MG tablet               . FUROSEMIDE PO   Oral   Take 1 tablet by mouth daily.         [name] lactulose (CHRONULAC) 10 GM/15ML solution   Oral   Take 30 mLs (20 g total) by mouth daily as needed for mild constipation.   120 mL   0   . metoprolol tartrate (LOPRESSOR) 25 MG tablet   Oral   Take 25 mg by mouth 2 (two) times daily.         [name] oxyCODONE-acetaminophen (ROXICET) 5-325 MG tablet   Oral   Take 1 tablet by mouth every 4 (four) hours as needed for severe pain.   15 tablet   0   . polyethylene glycol (MIRALAX) packet   Oral   Take 17 g by mouth daily.   14 each   0   . simvastatin (ZOCOR) 20 MG tablet   Oral   Take 20 mg by mouth daily.         [name] warfarin (COUMADIN) 2 MG tablet   Oral   Take 2 mg by mouth daily. Take with the '6mg'$   tablet for a total of '8mg'$ .         . warfarin (COUMADIN) 6 MG tablet   Oral   Take 6 mg by mouth daily. Every evening at 5 pm. Take with '2mg'$  tablet for a total of '8mg'$ .           Allergies Review of patient's allergies indicates no known allergies.  Family History  Problem Relation Age of Onset  . Aneurysm Mother   . Diabetes Father     Social History Social History  Substance Use Topics  . Smoking status: Current Every Day Smoker -- 0.50 packs/day for 50 years    Types: Cigarettes  . Smokeless tobacco: Never Used  . Alcohol Use: No     Review of Systems  Constitutional: No fever/chills, fatigue, change in appetite Eyes: No visual changes.  Cardiovascular: No chest pain. Respiratory: No cough. No shortness of breath. No wheezing.  Gastrointestinal: No abdominal pain.  No nausea, vomiting.  No diarrhea, constipation. Genitourinary: Negative for dysuria, hematuria. No urinary hesitancy, urgency or increased frequency. Musculoskeletal: Positive left lower side pain. Negative for back, neck pain.  Skin: Negative for rash, skin  sores. Neurological: Negative for headaches, focal weakness or numbness. No tingling. No saddle paraesthesias.  10-point ROS otherwise negative.  ____________________________________________   PHYSICAL EXAM:  VITAL SIGNS: ED Triage Vitals  Enc Vitals Group     BP 10/29/15 0959 146/94 mmHg     Pulse Rate 10/29/15 0959 67     Resp 10/29/15 0959 18     Temp 10/29/15 0959 97.7 F (36.5 C)     Temp Source 10/29/15 0959 Oral     SpO2 10/29/15 0959 99 %     Weight 10/29/15 0959 240 lb (108.863 kg)     Height 10/29/15 0959 '6\' 2"'$  (1.88 m)     Head Cir --      Peak Flow --      Pain Score 10/29/15 1000 9     Pain Loc --      Pain Edu? --      Excl. in [place]? --      Constitutional: Alert and oriented. Well appearing and in no acute distress. Eyes: Conjunctivae are normal. PERRLA. EOMI without pain.  Head: Atraumatic. ENT:           Nose: No congestion/rhinnorhea.      Mouth/Throat: Mucous membranes are moist.  Neck: Supple with full range of motion. Hematological/Lymphatic/Immunilogical: No cervical lymphadenopathy. Cardiovascular: Normal rate, regular rhythm. Grossly normal heart sounds. Good peripheral circulation with 2+ pulses in bilateral lower extremities (patient also wearing compression stockings). Respiratory: Normal respiratory effort without tachypnea or retractions. Lungs CTAB with breath sounds heard in all lung fields. Gastrointestinal: Soft and nontender without distention or guarding in all quadrants. Non incarcerated ventral hernia. No CVA tenderness. Musculoskeletal: No lumbar, sacral tenderness to palpation. No pain with palpation over the left rib cage. No lower extremity tenderness nor edema.  No joint effusions. Neurologic:  Normal speech and language. No gross focal neurologic deficits are appreciated.  Skin:  Skin is warm, dry and intact. No rash noted. Psychiatric: Mood and affect are normal. Speech and behavior are normal. Patient exhibits appropriate insight  and judgement.   ____________________________________________   LABS (all labs ordered are listed, but only abnormal results are displayed)  Labs Reviewed  BASIC METABOLIC PANEL - Abnormal; Notable for the following:    Glucose, Bld 125 (*)    Calcium 8.8 (*)    All  other components within normal limits  CBC WITH DIFFERENTIAL/PLATELET - Abnormal; Notable for the following:    WBC 3.0 (*)    Hemoglobin 11.7 (*)    HCT 36.3 (*)    MCV 76.4 (*)    MCH 24.5 (*)    RDW 17.3 (*)    Platelets 121 (*)    All other components within normal limits  URINALYSIS COMPLETEWITH MICROSCOPIC ([facility] ONLY) - Abnormal; Notable for the following:    Color, Urine YELLOW (*)    APPearance CLEAR (*)    Hgb urine dipstick 1+ (*)    Protein, ur 100 (*)    Squamous Epithelial / LPF 0-5 (*)    All other components within normal limits    ____________________________________________  EKG  None ____________________________________________  RADIOLOGY  None ____________________________________________    PROCEDURES  Procedure(s) performed: None      Medications - No data to display   ____________________________________________   INITIAL IMPRESSION / ASSESSMENT AND PLAN / ED COURSE  Pertinent lab results that were available during my care of the patient were reviewed by me and considered in my medical decision making (see chart for details).    I spoke with the patient's primary care provider at [facility]. Patient has an appointment today at 1:20 PM to follow-up chronic anemia and have a PT/INR rechecked.  Considering he has chronic anemia will provide copies of the lab results to the patient for him to take to his appointment that is scheduled this afternoon. I have alerted [place] clinic that the patient is here in the emergency department and will be discharged in time to make it to his appointment this afternoon.  Patient's diagnosis is consistent  with musculoskeletal pain. Patient will be discharged home with instructions to attend his appointment with his primary care provider at 1:20 PM today. The patient had no reproducible pain on evaluation and his overall physical exam was benign. Patient has noted several times that the pain was not accompanied by any other symptoms and overall he has felt well and per his baseline since the pain began 2 days ago. It is noted that the patient missed his scheduled CT with contrast on 09/16/2015 and I have advised that he contact his pulmonologist to reschedule that appointment for repeat as necessary. Patient is given strict ED precautions to return to the ED for any worsening or new symptoms.    ____________________________________________  FINAL CLINICAL IMPRESSION(S) / ED DIAGNOSES  Final diagnoses:  Musculoskeletal pain      NEW MEDICATIONS STARTED DURING THIS VISIT:  New Prescriptions   No medications on file         Braxton Feathers, PA-C 10/29/15 1309  Lisa Roca, MD 10/29/15 1423

## 2015-10-29 NOTE — ED Notes (Signed)
Pt ambulatory to triage with c/o left sided abdominal pain for the last two days  Denies N/V/D

## 2015-11-05 ENCOUNTER — Other Ambulatory Visit: Payer: Self-pay | Admitting: Family Medicine

## 2015-11-05 DIAGNOSIS — R911 Solitary pulmonary nodule: Secondary | ICD-10-CM

## 2015-11-25 ENCOUNTER — Ambulatory Visit: Payer: Medicare Other | Attending: Family Medicine

## 2015-12-11 ENCOUNTER — Ambulatory Visit: Admission: RE | Admit: 2015-12-11 | Payer: Medicare Other | Source: Ambulatory Visit

## 2016-01-06 ENCOUNTER — Encounter: Payer: Medicare Other | Attending: Internal Medicine | Admitting: Internal Medicine

## 2016-01-06 DIAGNOSIS — F17210 Nicotine dependence, cigarettes, uncomplicated: Secondary | ICD-10-CM | POA: Insufficient documentation

## 2016-01-06 DIAGNOSIS — L97321 Non-pressure chronic ulcer of left ankle limited to breakdown of skin: Secondary | ICD-10-CM | POA: Insufficient documentation

## 2016-01-06 DIAGNOSIS — Z7901 Long term (current) use of anticoagulants: Secondary | ICD-10-CM | POA: Diagnosis not present

## 2016-01-06 DIAGNOSIS — I87332 Chronic venous hypertension (idiopathic) with ulcer and inflammation of left lower extremity: Secondary | ICD-10-CM | POA: Diagnosis present

## 2016-01-06 DIAGNOSIS — Z86718 Personal history of other venous thrombosis and embolism: Secondary | ICD-10-CM | POA: Insufficient documentation

## 2016-01-06 DIAGNOSIS — M109 Gout, unspecified: Secondary | ICD-10-CM | POA: Diagnosis not present

## 2016-01-06 DIAGNOSIS — I4891 Unspecified atrial fibrillation: Secondary | ICD-10-CM | POA: Diagnosis not present

## 2016-01-06 DIAGNOSIS — I10 Essential (primary) hypertension: Secondary | ICD-10-CM | POA: Diagnosis not present

## 2016-01-06 DIAGNOSIS — Z8673 Personal history of transient ischemic attack (TIA), and cerebral infarction without residual deficits: Secondary | ICD-10-CM | POA: Diagnosis not present

## 2016-01-06 NOTE — Progress Notes (Signed)
KAIUS, DAINO ([id number]) Visit Report for 01/06/2016 Abuse/Suicide Risk Screen Details [place], Date of Service: 01/06/2016 9:00 AM Patient Name: [name] Patient Account Number: [id number] Medical Record Treating RN: Montey Hora [id number] Number: Other Clinician: Date of Birth/Sex: Mar 09, 1951 (65 y.o. Male) Treating [name], MICHAEL Primary Care Physician/Extender: Tonye Pearson Physician: Referring Physician: Doristine Locks in Treatment: 0 Abuse/Suicide Risk Screen Items Answer ABUSE/SUICIDE RISK SCREEN: Has anyone close to you tried to hurt or harm you recentlyo No Do you feel uncomfortable with anyone in your familyo No Has anyone forced you do things that you didnot want to doo No Do you have any thoughts of harming yourselfo No Patient displays signs or symptoms of abuse and/or neglect. No Electronic Signature(s) Signed: 01/06/2016 4:39:07 PM By: Montey Hora Entered By: Montey Hora on 01/06/2016 09:00:10 [name] ([id number]) -------------------------------------------------------------------------------- Activities of Daily Living Details [place], Date of Service: 01/06/2016 9:00 AM Patient Name: [name] Patient Account Number: [id number] Medical Record Treating RN: Montey Hora [id number] Number: Other Clinician: Date of Birth/Sex: 1950-12-09 (65 y.o. Male) Treating Dellia Nims, MICHAEL Primary Care Physician/Extender: Tonye Pearson Physician: Referring Physician: Doristine Locks in Treatment: 0 Activities of Daily Living Items Answer Activities of Daily Living (Please select one for each item) Drive Automobile Completely Able Take Medications Completely Able Use Telephone Completely [place] for Appearance Completely Able Use Toilet Completely Able Bath / Shower Completely Able Dress Self Completely Able Feed Self Completely Able Walk Completely Able Get In / Out Bed Completely Able Housework Completely Able Prepare  Meals Completely [place] for Self Completely Able Electronic Signature(s) Signed: 01/06/2016 4:39:07 PM By: Montey Hora Entered By: Montey Hora on 01/06/2016 09:00:32 [name] ([id number]) -------------------------------------------------------------------------------- Education Assessment Details [place], Date of Service: 01/06/2016 9:00 AM Patient Name: [name] Patient Account Number: [id number] Medical Record Treating RN: Montey Hora [id number] Number: Other Clinician: Date of Birth/Sex: 1951-05-12 (65 y.o. Male) Treating [name], MICHAEL Primary Care Physician/Extender: Tonye Pearson Physician: Referring Physician: Doristine Locks in Treatment: 0 Primary Learner Assessed: Patient Learning Preferences/Education Level/Primary Language Learning Preference: Explanation, Demonstration Highest Education Level: High School Preferred Language: English Cognitive Barrier Assessment/Beliefs Language Barrier: No Translator Needed: No Memory Deficit: No Emotional Barrier: No Cultural/Religious Beliefs Affecting Medical No Care: Physical Barrier Assessment Impaired Vision: No Impaired Hearing: No Decreased Hand dexterity: No Knowledge/Comprehension Assessment Knowledge Level: Medium Comprehension Level: Medium Ability to understand written Medium instructions: Ability to understand verbal Medium instructions: Motivation Assessment Anxiety Level: Calm Cooperation: Cooperative Education Importance: Acknowledges Need Interest in Health Problems: Asks Questions Perception: Coherent Willingness to Engage in Self- Medium Management Activities: [name] ([id number]) Readiness to Engage in Self- Management Activities: Electronic Signature(s) Signed: 01/06/2016 4:39:07 PM By: Montey Hora Entered By: Montey Hora on 01/06/2016 09:01:00 [name]  ([id number]) -------------------------------------------------------------------------------- Fall Risk Assessment Details [place], Date of Service: 01/06/2016 9:00 AM Patient Name: [name] Patient Account Number: [id number] Medical Record Treating RN: Montey Hora [id number] Number: Other Clinician: Date of Birth/Sex: [birth year] (65 y.o. Male) Treating [name], MICHAEL Primary Care Physician/Extender: Tonye Pearson Physician: Referring Physician: Doristine Locks in Treatment: 0 Fall Risk Assessment Items Have you had 2 or more falls in the last 12 monthso 0 No Have you had any fall that resulted in injury in the last 12 monthso 0 No FALL RISK ASSESSMENT: History of falling - immediate or within 3 months 0 No Secondary diagnosis 0 No Ambulatory aid None/bed rest/wheelchair/nurse 0 Yes Crutches/cane/walker 0 No Furniture 0 No IV Access/Saline Lock 0  No Gait/Training Normal/bed rest/immobile 0 No Weak 10 Yes Impaired 0 No Mental Status Oriented to own ability 0 Yes Electronic Signature(s) Signed: 01/06/2016 4:39:07 PM By: Montey Hora Entered By: Montey Hora on 01/06/2016 09:01:24 [name] ([id number]) -------------------------------------------------------------------------------- Foot Assessment Details [place], Date of Service: 01/06/2016 9:00 AM Patient Name: [name] Patient Account Number: [id number] Medical Record Treating RN: Montey Hora [id number] Number: Other Clinician: Date of Birth/Sex: 07-Jun-1950 (65 y.o. Male) Treating [name], MICHAEL Primary Care Physician/Extender: Tonye Pearson Physician: Referring Physician: Doristine Locks in Treatment: 0 Foot Assessment Items Site Locations + = Sensation present, - = Sensation absent, C = Callus, U = Ulcer R = Redness, W = Warmth, M = Maceration, PU = Pre-ulcerative lesion F = Fissure, S = Swelling, D = Dryness Assessment Right: Left: Other Deformity: No No Prior Foot Ulcer: No  No Prior Amputation: No No Charcot Joint: No No Ambulatory Status: Ambulatory Without Help Gait: Steady Electronic Signature(s) Signed: 01/06/2016 4:39:07 PM By: Montey Hora Entered By: Montey Hora on 01/06/2016 09:02:03 [name] ([id number]) [name] ([id number]) -------------------------------------------------------------------------------- Nutrition Risk Assessment Details [place], Date of Service: 01/06/2016 9:00 AM Patient Name: [name] Patient Account Number: [id number] Medical Record Treating RN: Montey Hora [id number] Number: Other Clinician: Date of Birth/Sex: 1950-06-21 (65 y.o. Male) Treating [name], MICHAEL Primary Care Physician/Extender: Tonye Pearson Physician: Referring Physician: Doristine Locks in Treatment: 0 Height (in): 74 Weight (lbs): 240 Body Mass Index (BMI): 30.8 Nutrition Risk Assessment Items NUTRITION RISK SCREEN: I have an illness or condition that made me change the kind and/or 0 No amount of food I eat I eat fewer than two meals per day 0 No I eat few fruits and vegetables, or milk products 0 No I have three or more drinks of beer, liquor or wine almost every day 0 No I have tooth or mouth problems that make it hard for me to eat 0 No I don't always have enough money to buy the food I need 0 No I eat alone most of the time 0 No I take three or more different prescribed or over-the-counter drugs a 1 Yes day Without wanting to, I have lost or gained 10 pounds in the last six 0 No months I am not always physically able to shop, cook and/or feed myself 0 No Nutrition Protocols Good Risk Protocol 0 No interventions needed Moderate Risk Protocol Electronic Signature(s) Signed: 01/06/2016 4:39:07 PM By: Montey Hora Entered By: Montey Hora on 01/06/2016 09:01:29

## 2016-01-07 NOTE — Progress Notes (Signed)
ROLLYN, SCIALDONE ([id number]) Visit Report for 01/06/2016 Allergy List Details [place], Date of Service: 01/06/2016 9:00 AM Patient Name: [name] Patient Account Number: [id number] Medical Record Treating RN: Montey Hora [id number] Number: Other Clinician: Date of Birth/Sex: Jul 14, 1950 (65 y.o. Male) Treating Dellia Nims, MICHAEL Primary Care Physician/Extender: Tonye Pearson Physician: Referring Physician: Royetta Crochet Weeks in Treatment: 0 Allergies Active Allergies No Known Allergies Allergy Notes Electronic Signature(s) Signed: 01/06/2016 4:39:07 PM By: Montey Hora Entered By: Montey Hora on 01/06/2016 08:57:43 Winkowski, [name] ([id number]) -------------------------------------------------------------------------------- Arrival Information Details [place], Date of Service: 01/06/2016 9:00 AM Patient Name: [name] Patient Account Number: [id number] Medical Record Treating RN: Montey Hora [id number] Number: Other Clinician: Date of Birth/Sex: 17-Apr-1951 (65 y.o. Male) Treating [name], MICHAEL Primary Care Physician/Extender: Tonye Pearson Physician: Referring Physician: Doristine Locks in Treatment: 0 Visit Information Patient Arrived: Ambulatory Arrival Time: 08:53 Accompanied By: self Transfer Assistance: None Patient Identification Verified: Yes Secondary Verification Process Yes Completed: Patient Has Alerts: Yes Patient Alerts: Patient on Blood Thinner warfarin History Since Last Visit Added or deleted any medications: No Any new allergies or adverse reactions: No Had a fall or experienced change in activities of daily living that may affect risk of falls: No Signs or symptoms of abuse/neglect since last visito No Hospitalized since last visit: No Electronic Signature(s) Signed: 01/06/2016 4:39:07 PM By: Montey Hora Entered By: Montey Hora on 01/06/2016 08:54:02 Erichsen, [name]  ([id number]) -------------------------------------------------------------------------------- Clinic Level of Care Assessment Details [place], Date of Service: 01/06/2016 9:00 AM Patient Name: [name] Patient Account Number: [id number] Medical Record Treating RN: Montey Hora [id number] Number: Other Clinician: Date of Birth/Sex: [birth year] (65 y.o. Male) Treating Dellia Nims, MICHAEL Primary Care Physician/Extender: Tonye Pearson Physician: Referring Physician: Doristine Locks in Treatment: 0 Clinic Level of Care Assessment Items TOOL 1 Quantity Score '[]'$  - Use when EandM and Procedure is performed on INITIAL visit 0 ASSESSMENTS - Nursing Assessment / Reassessment X - General Physical Exam (combine w/ comprehensive assessment (listed just 1 20 below) when performed on new pt. evals) X - Comprehensive Assessment (HX, ROS, Risk Assessments, Wounds Hx, etc.) 1 25 ASSESSMENTS - Wound and Skin Assessment / Reassessment '[]'$  - Dermatologic / Skin Assessment (not related to wound area) 0 ASSESSMENTS - Ostomy and/or Continence Assessment and Care '[]'$  - Incontinence Assessment and Management 0 '[]'$  - Ostomy Care Assessment and Management (repouching, etc.) 0 PROCESS - Coordination of Care X - Simple Patient / Family Education for ongoing care 1 15 '[]'$  - Complex (extensive) Patient / Family Education for ongoing care 0 X - Staff obtains Programmer, systems, Records, Test Results / Process Orders 1 10 '[]'$  - Staff telephones HHA, Nursing Homes / Clarify orders / etc 0 '[]'$  - Routine Transfer to another Facility (non-emergent condition) 0 '[]'$  - Routine Hospital Admission (non-emergent condition) 0 X - New Admissions / Biomedical engineer / Ordering NPWT, Apligraf, etc. 1 15 '[]'$  - Emergency Hospital Admission (emergent condition) 0 PROCESS - Special Needs '[]'$  - Pediatric / Minor Patient Management 0 Pals, Meade ([id number]) '[]'$  - Isolation Patient Management 0 '[]'$  - Hearing / Language / Visual  special needs 0 '[]'$  - Assessment of Community assistance (transportation, D/C planning, etc.) 0 '[]'$  - Additional assistance / Altered mentation 0 '[]'$  - Support Surface(s) Assessment (bed, cushion, seat, etc.) 0 INTERVENTIONS - Miscellaneous '[]'$  - External ear exam 0 '[]'$  - Patient Transfer (multiple staff / Civil Service fast streamer / Similar devices) 0 '[]'$  - Simple Staple / Suture removal (25 or less) 0 '[]'$  - Complex Staple / Suture  removal (26 or more) 0 '[]'$  - Hypo/Hyperglycemic Management (do not check if billed separately) 0 X - Ankle / Brachial Index (ABI) - do not check if billed separately 1 15 Has the patient been seen at the hospital within the last three years: Yes Total Score: 100 Level Of Care: New/Established - Level 3 Electronic Signature(s) Signed: 01/06/2016 4:39:07 PM By: Montey Hora Entered By: Montey Hora on 01/06/2016 09:26:36 Dormer, [name] ([id number]) -------------------------------------------------------------------------------- Encounter Discharge Information Details [place], Date of Service: 01/06/2016 9:00 AM Patient Name: [name] Patient Account Number: [id number] Medical Record Treating RN: Montey Hora [id number] Number: Other Clinician: Date of Birth/Sex: 19-Aug-1950 (65 y.o. Male) Treating [name], MICHAEL Primary Care Physician/Extender: Tonye Pearson Physician: Referring Physician: Doristine Locks in Treatment: 0 Encounter Discharge Information Items Discharge Pain Level: 0 Discharge Condition: Stable Ambulatory Status: Ambulatory Discharge Destination: Home Transportation: Private Auto Accompanied By: self Schedule Follow-up Appointment: Yes Medication Reconciliation completed and provided to Patient/Care No Yenty Bloch: Provided on Clinical Summary of Care: 01/06/2016 Form Type Recipient Paper Patient DW Electronic Signature(s) Signed: 01/06/2016 4:39:07 PM By: Montey Hora Previous Signature: 01/06/2016 9:42:27 AM Version By: Ruthine Dose Entered By: Montey Hora on 01/06/2016 09:45:25 Celia, [name] ([id number]) -------------------------------------------------------------------------------- Lower Extremity Assessment Details [place], Date of Service: 01/06/2016 9:00 AM Patient Name: [name] Patient Account Number: [id number] Medical Record Treating RN: Montey Hora [id number] Number: Other Clinician: Date of Birth/Sex: 1950-12-07 (65 y.o. Male) Treating [name], MICHAEL Primary Care Physician/Extender: Tonye Pearson Physician: Referring Physician: Doristine Locks in Treatment: 0 Edema Assessment Assessed: [Left: No] [Right: No] Edema: [Left: Ye] [Right: s] Calf Left: Right: Point of Measurement: 40 cm From Medial Instep 44.7 cm cm Ankle Left: Right: Point of Measurement: 13 cm From Medial Instep 31.3 cm cm Vascular Assessment Pulses: Posterior Tibial Palpable: [Left:Yes] Doppler: [Left:Monophasic] Dorsalis Pedis Palpable: [Left:Yes] Doppler: [Left:Monophasic] Extremity colors, hair growth, and conditions: Extremity Color: [Left:Hyperpigmented] Hair Growth on Extremity: [Left:No] Temperature of Extremity: [Left:Warm] Capillary Refill: [Left:< 3 seconds] Blood Pressure: Brachial: [Left:158] Dorsalis Pedis: 178 [Left:Dorsalis Pedis:] Ankle: Posterior Tibial: 166 [Left:Posterior Tibial: 1.13] Toe Nail Assessment Left: Right: Thick: No Lorey, Himmat ([id number]) Discolored: No Deformed: No Improper Length and Hygiene: No Electronic Signature(s) Signed: 01/06/2016 4:39:07 PM By: Montey Hora Entered By: Montey Hora on 01/06/2016 09:14:51 Turek, [name] ([id number]) -------------------------------------------------------------------------------- Multi Wound Chart Details [place], Date of Service: 01/06/2016 9:00 AM Patient Name: [name] Patient Account Number: [id number] Medical Record Treating RN: Montey Hora [id number] Number: Other Clinician: Date of  Birth/Sex: [birth year] (65 y.o. Male) Treating [name], MICHAEL Primary Care Physician/Extender: Tonye Pearson Physician: Referring Physician: Doristine Locks in Treatment: 0 Vital Signs Height(in): 74 Pulse(bpm): 80 Weight(lbs): 240 Blood Pressure 159/92 (mmHg): Body Mass Index(BMI): 31 Temperature(F): 97.8 Respiratory Rate 20 (breaths/min): Photos: [N/A:N/A] Wound Location: Left Malleolus - Medial N/A N/A Wounding Event: Trauma N/A N/A Primary Etiology: Venous Leg Ulcer N/A N/A Comorbid History: Arrhythmia, Hypertension, N/A N/A Gout, Neuropathy Date Acquired: 12/29/2015 N/A N/A Weeks of Treatment: 0 N/A N/A Wound Status: Open N/A N/A Measurements L x W x D 0.5x0.9x0.2 N/A N/A (cm) Area (cm) : 0.353 N/A N/A Volume (cm) : 0.071 N/A N/A Classification: Full Thickness Without N/A N/A Exposed Support Structures Exudate Amount: Medium N/A N/A Exudate Type: Serous N/A N/A Exudate Color: amber N/A N/A Wound Margin: Flat and Intact N/A N/A Granulation Amount: None Present (0%) N/A N/A Steidle, Jameal ([id number]) Necrotic Amount: Large (67-100%) N/A N/A Necrotic Tissue: Eschar, Adherent Slough N/A N/A Exposed Structures: Fascia: No N/A N/A Fat: No  Tendon: No Muscle: No Joint: No Bone: No Limited to Skin Breakdown Epithelialization: None N/A N/A Periwound Skin Texture: Edema: No N/A N/A Excoriation: No Induration: No Callus: No Crepitus: No Fluctuance: No Friable: No Rash: No Scarring: No Periwound Skin Maceration: No N/A N/A Moisture: Moist: No Dry/Scaly: No Periwound Skin Color: Erythema: Yes N/A N/A Atrophie Blanche: No Cyanosis: No Ecchymosis: No Hemosiderin Staining: No Mottled: No Pallor: No Rubor: No Erythema Location: Circumferential N/A N/A Temperature: No Abnormality N/A N/A Tenderness on Yes N/A N/A Palpation: Wound Preparation: Ulcer Cleansing: N/A N/A Rinsed/Irrigated with Saline Topical Anesthetic Applied: Other:  lidocaine 4% Treatment Notes Electronic Signature(s) Signed: 01/06/2016 4:39:07 PM By: Montey Hora Entered By: Montey Hora on 01/06/2016 09:23:39 Minix, [name] ([id number]) Kia, [name] ([id number]) -------------------------------------------------------------------------------- Multi-Disciplinary Care Plan Details [place], Date of Service: 01/06/2016 9:00 AM Patient Name: [name] Patient Account Number: [id number] Medical Record Treating RN: Montey Hora [id number] Number: Other Clinician: Date of Birth/Sex: [birth year] (65 y.o. Male) Treating [name], MICHAEL Primary Care Physician/Extender: Tonye Pearson Physician: Referring Physician: Doristine Locks in Treatment: 0 Active Inactive Orientation to the Wound Care Program Nursing Diagnoses: Knowledge deficit related to the wound healing [name] program Goals: Patient/caregiver will verbalize understanding of the [place] Program Date Initiated: 01/06/2016 Goal Status: Active Interventions: Provide education on orientation to the wound [name] Notes: Venous Leg Ulcer Nursing Diagnoses: Actual venous Insuffiency (use after diagnosis is confirmed) Goals: Patient will maintain optimal edema control Date Initiated: 01/06/2016 Goal Status: Active Interventions: Compression as ordered Notes: Wound/Skin Impairment Nursing Diagnoses: Knowledge deficit related to ulceration/compromised skin integrity Laubacher, [name] ([id number]) Goals: Patient/caregiver will verbalize understanding of skin care regimen Date Initiated: 01/06/2016 Goal Status: Active Ulcer/skin breakdown will have a volume reduction of 30% by week 4 Date Initiated: 01/06/2016 Goal Status: Active Ulcer/skin breakdown will have a volume reduction of 50% by week 8 Date Initiated: 01/06/2016 Goal Status: Active Ulcer/skin breakdown will have a volume reduction of 80% by week 12 Date Initiated: 01/06/2016 Goal Status:  Active Ulcer/skin breakdown will heal within 14 weeks Date Initiated: 01/06/2016 Goal Status: Active Interventions: Assess patient/caregiver ability to obtain necessary supplies Assess patient/caregiver ability to perform ulcer/skin care regimen upon admission and as needed Assess ulceration(s) every visit Notes: Electronic Signature(s) Signed: 01/06/2016 4:39:07 PM By: Montey Hora Entered By: Montey Hora on 01/06/2016 09:23:18 Steward, [name] ([id number]) -------------------------------------------------------------------------------- Pain Assessment Details [place], Date of Service: 01/06/2016 9:00 AM Patient Name: [name] Patient Account Number: [id number] Medical Record Treating RN: Montey Hora [id number] Number: Other Clinician: Date of Birth/Sex: April 24, 1951 (65 y.o. Male) Treating [name], MICHAEL Primary Care Physician/Extender: Tonye Pearson Physician: Referring Physician: Doristine Locks in Treatment: 0 Active Problems Location of Pain Severity and Description of Pain Patient Has Paino Yes Site Locations Pain Location: Pain in Ulcers With Dressing Change: Yes Duration of the Pain. Constant / Intermittento Constant Pain Management and Medication Current Pain Management: Notes Topical or injectable lidocaine is offered to patient for acute pain when surgical debridement is performed. If needed, Patient is instructed to use over the counter pain medication for the following 24-48 hours after debridement. Wound care MDs do not prescribed pain medications. Patient has chronic pain or uncontrolled pain. Patient has been instructed to make an appointment with their Primary Care Physician for pain management. Electronic Signature(s) Signed: 01/06/2016 4:39:07 PM By: Montey Hora Entered By: Montey Hora on 01/06/2016 08:54:16 Rentfrow, [name]  ([id number]) -------------------------------------------------------------------------------- Patient/Caregiver Education Details [place], Date of Service: 01/06/2016 9:00 AM Patient Name: [facility] Patient Account Number: [id number]  Medical Record Treating RN: Montey Hora [id number] Number: Other Clinician: Date of Birth/Gender: Mar 04, 1951 (65 y.o. Male) Treating [name], MICHAEL Primary Care Physician/Extender: Tonye Pearson Physician: Suella [place] in Treatment: 0 Referring Physician: Royetta Crochet Education Assessment Education Provided To: Patient Education Topics Provided Venous: Handouts: Controlling Swelling with Multilayered Compression Wraps Methods: Explain/Verbal, Printed Responses: State content correctly Electronic Signature(s) Signed: 01/06/2016 4:39:07 PM By: Montey Hora Entered By: Montey Hora on 01/06/2016 09:45:38 Toda, [name] ([id number]) -------------------------------------------------------------------------------- Wound Assessment Details [place], Date of Service: 01/06/2016 9:00 AM Patient Name: [name] Patient Account Number: [id number] Medical Record Treating RN: Montey Hora [id number] Number: Other Clinician: Date of Birth/Sex: 02-28-1951 (65 y.o. Male) Treating [name], MICHAEL Primary Care Physician/Extender: Tonye Pearson Physician: Referring Physician: Doristine Locks in Treatment: 0 Wound Status Wound Number: 2 Primary Venous Leg Ulcer Etiology: Wound Location: Left Malleolus - Medial Wound Status: Open Wounding Event: Trauma Comorbid Arrhythmia, Hypertension, Gout, Date Acquired: 12/29/2015 History: Neuropathy Weeks Of Treatment: 0 Clustered Wound: No Photos Wound Measurements Length: (cm) 0.5 Width: (cm) 0.9 Depth: (cm) 0.2 Area: (cm) 0.353 Volume: (cm) 0.071 % Reduction in Area: % Reduction in Volume: Epithelialization: None Tunneling: No Undermining: No Wound Description Full Thickness Without  Exposed Classification: Support Structures Wound Margin: Flat and Intact Exudate Medium Amount: Exudate Type: Serous Exudate Color: amber Foul Odor After Cleansing: No Wound Bed Granulation Amount: None Present (0%) Exposed Structure Abbs, Kevork ([id number]) Necrotic Amount: Large (67-100%) Fascia Exposed: No Necrotic Quality: Eschar, Adherent Slough Fat Layer Exposed: No Tendon Exposed: No Muscle Exposed: No Joint Exposed: No Bone Exposed: No Limited to Skin Breakdown Periwound Skin Texture Texture Color No Abnormalities Noted: No No Abnormalities Noted: No Callus: No Atrophie Blanche: No Crepitus: No Cyanosis: No Excoriation: No Ecchymosis: No Fluctuance: No Erythema: Yes Friable: No Erythema Location: Circumferential Induration: No Hemosiderin Staining: No Localized Edema: No Mottled: No Rash: No Pallor: No Scarring: No Rubor: No Moisture Temperature / Pain No Abnormalities Noted: No Temperature: No Abnormality Dry / Scaly: No Tenderness on Palpation: Yes Maceration: No Moist: No Wound Preparation Ulcer Cleansing: Rinsed/Irrigated with Saline Topical Anesthetic Applied: Other: lidocaine 4%, Treatment Notes Wound #2 (Left, Medial Malleolus) 1. Cleansed with: Clean wound with Normal Saline 2. Anesthetic Topical Lidocaine 4% cream to wound bed prior to debridement 4. Dressing Applied: Prisma Ag 5. Secondary Dressing Applied ABD Pad 7. Secured with Tape 4-Layer Compression System - Left Lower Extremity Electronic Signature(s) Signed: 01/06/2016 4:39:07 PM By: Tamsen Snider, [name] ([id number]) Entered By: Montey Hora on 01/06/2016 09:07:26 [place], [name] ([id number]) -------------------------------------------------------------------------------- Vitals Details [place], Date of Service: 01/06/2016 9:00 AM Patient Name: [name] Patient Account Number: [id number] Medical Record Treating RN: Montey Hora [id number] Number: Other Clinician: Date of Birth/Sex: Aug 14, 1950 (65 y.o. Male) Treating [name], MICHAEL Primary Care Physician/Extender: Tonye Pearson Physician: Referring Physician: Doristine Locks in Treatment: 0 Vital Signs Time Taken: 08:54 Temperature (F): 97.8 Height (in): 74 Pulse (bpm): 80 Source: Stated Respiratory Rate (breaths/min): 20 Weight (lbs): 240 Blood Pressure (mmHg): 159/92 Source: Stated Reference Range: 80 - 120 mg / dl Body Mass Index (BMI): 30.8 Electronic Signature(s) Signed: 01/06/2016 4:39:07 PM By: Montey Hora Entered By: Montey Hora on 01/06/2016 08:56:38

## 2016-01-07 NOTE — Progress Notes (Signed)
DORIS, MCGILVERY ([id number]) Visit Report for 01/06/2016 Chief Complaint Document Details Jolley, Date of Service: 01/06/2016 9:00 AM Patient Name: [name] Patient Account Number: [id number] Medical Record Treating RN: Montey Hora [id number] Number: Other Clinician: Date of Birth/Sex: 16-Nov-1950 (65 y.o. Male) Treating Dellia Nims, MICHAEL Primary Care Physician/Extender: Tonye Pearson Physician: Referring Physician: Doristine Locks in Treatment: 0 Information Obtained from: Patient Chief Complaint Patient presents for treatment of an open ulcer due to venous insufficiency. He says he noticed a "into Mark right medial ankle for about 3-4 weeks 01/06/16; patient reaper sensitive today for review of a wound over his left medial ankle there is been present for one week Electronic Signature(s) Signed: 01/07/2016 7:31:48 AM By: [name] MD Entered By: [name] on 01/06/2016 09:54:48 Lewellen, [name] ([id number]) -------------------------------------------------------------------------------- HPI Details Kruzel, Date of Service: 01/06/2016 9:00 AM Patient Name: [name] Patient Account Number: [id number] Medical Record Treating RN: Montey Hora [id number] Number: Other Clinician: Date of Birth/Sex: 1950/07/27 (65 y.o. Male) Treating [name], MICHAEL Primary Care Physician/Extender: Tonye Pearson Physician: Referring Physician: Doristine Locks in Treatment: 0 History of Present Illness Location: open wound just above his right ankle Quality: Patient reports experiencing a dull pain to affected area(s). Severity: Patient states wound are getting worse. Duration: Patient has had Mark wound for < 4 weeks prior to presenting for treatment Timing: Pain in wound is Intermittent (comes and goes Context: Mark wound appeared gradually over time Modifying Factors: Consults to this date include: he was seen in [facility] ER and was referred to a vascular surgeon but  Mark patient has not done that. He may have been treated with clindamycin in [facility] ER. Associated Signs and Symptoms: Patient reports having difficulty standing for long periods. HPI Description: 64 year old gentleman who was seen in [facility] emergency department recently on 01/06/2015 for a wound of his right lower extremity which he says was not involving any injury and he did not know how he sustained it. He had draining foul-smelling liquid from [facility] area and had gone for care there. his past medical history is significant for DVT, hypertension, gout, tobacco abuse, cocaine abuse, stroke, atrial fibrillation, pulmonary embolism. he has also had some vascular surgery with a stent placed in his leg. He has been a smoker for many years and has given up straight drugs several years ago. He continues to smoke about 4-5 cigarettes a day. 02/03/2015 -- received a note from 05/14/2013 where Dr. Leotis Pain placed an inferior vena cava filter. Mark patient had a deep vein thrombosis while therapeutic on anticoagulation for previous DVT and a IVC filter was placed for this. 02/10/2015 -- he did have his vascular test done on Friday but we have no reports yet. 02/17/2015 -- notes were reviewed from [facility] vascular office and Mark patient had a venous ultrasound done which revealed that he had no reflux in Mark greater saphenous vein or Mark short saphenous vein bilaterally. He did have subacute DVT in Mark common femoral vein and popliteal veins on Mark right and left side. Mark recommendation was to continue with Unna's boot therapy at [facility] wound clinic and then to wear graduated compression stockings once Mark ulcers healed and later if he had continuous problems lymphedema pump would benefit him. 03/17/2015 -- we have applied for his insurance and aide regarding cellular tissue-based products and are still awaiting Mark final clearance. 03/24/2015 -- he has had Apligraf authorized for him but his wound is looking so good  today that we may not use it. 03/31/2015 --  he has not yet received his compression stockings though we have called a couple of times and hopefully they should arrive this week. SHADMAN, TOZZI ([id number]) [place] 01/06/16; this is a patient we have previously cared for in this clinic with wounds on his right medial ankle. I was not previously involved with his care. 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 He wears compression stockings. He had reflux studies done during his last stay in this clinic which did not show significant reflux in Mark greater or lesser saphenous veins bilaterally. His history is that he developed a open sore on Mark left medial malleolus one week ago. He was seen in his primary physician office and given a course of doxycycline which he still should be on. Previously seen vascular surgery who felt that he had some degree of lymphedema as well. 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 Male) Treating RN: Primary Care Physician: Royetta Crochet Other Clinician: Referring Physician: Royetta Crochet Treating Physician/Extender: Weeks in Treatment: 0 Constitutional Patient is hypertensive.[name] Heart rate irregular.[name] Respirations regular, non-labored and within target range.. Temperature is normal and within Mark target range for Mark patient.. Patient appeared stable. Respiratory Respiratory effort is easy and symmetric bilaterally. Rate is normal at rest and  on room air.. Bilateral breath sounds are clear and equal in all lobes with no wheezes, rales or rhonchi.. Cardiovascular A. fib no S3. Pedal pulses palpable and strong bilaterally.. Gastrointestinal (GI) Abdomen is soft and non-distended without masses or tenderness. Bowel sounds active in all quadrants.. No liver or spleen enlargement or tenderness.. Lymphatic None palpable in Mark popliteal or inguinal areas. Musculoskeletal No difficulties noted with Mark ankle joint on Mark left. Integumentary (Hair, Skin) Patient has some degree of venous insufficiency but his edema appeared to be well controlled. Notes Wound exam; small wound with some depth over Mark medial malleolus on Mark left. There is some surrounding erythema I wonder if this is venous inflammation rather than true cellulitis although I did not alter Mark doxycycline he is currently on. His edema appears to be well controlled Electronic Signature(s) Signed: 01/07/2016 7:31:48 AM By: [name] MD Entered By: [name] on 01/06/2016 10:06:21 Lasker, [name] ([id number]) -------------------------------------------------------------------------------- Physician Orders Details Colston, Date of Service: 01/06/2016 9:00 AM Patient Name: [facility] Patient Account Number: [id number] Medical Record Treating RN: Montey Hora [id number] Number: Other Clinician: Date of Birth/Sex: 31-May-1950 (65 y.o. Male) Treating [name], MICHAEL Primary Care Physician/Extender: Tonye Pearson Physician: Referring Physician: Doristine Locks in Treatment: 0 Verbal / Phone Orders: Yes Clinician: Montey Hora Read Back and Verified: Yes Diagnosis Coding Wound Cleansing Wound #2 Left,Medial Malleolus o Clean wound with Normal Saline. Anesthetic Wound #2 Left,Medial Malleolus o Topical Lidocaine 4% cream applied to wound bed prior to debridement Primary Wound Dressing Wound #2 Left,Medial Malleolus o Prisma  Ag Secondary Dressing Wound #2 Left,Medial Malleolus o ABD pad Dressing Change Frequency Wound #2 Left,Medial Malleolus o Change dressing every week Follow-up Appointments Wound #2 Left,Medial Malleolus o Return Appointment in 1 week. Edema Control Wound #2 Left,Medial Malleolus o 4-Layer Compression System - Right Lower Extremity o Elevate  legs to Mark level of Mark heart and pump ankles as often as possible - and when sitting Additional Orders / Instructions Wound #2 Left,Medial Malleolus o Increase protein intake. DAQUAWN, SEELMAN ([id number]) Electronic Signature(s) Signed: 01/06/2016 4:39:07 PM By: Montey Hora Signed: 01/07/2016 7:31:48 AM By: [name] MD Entered By: Montey Hora on 01/06/2016 09:25:34 Jeremiah, [name] ([id number]) -------------------------------------------------------------------------------- Problem List Details Steinberger, Date of Service: 01/06/2016 9:00 AM Patient Name: [facility] Patient Account Number: [id number] Medical Record Treating RN: Montey Hora [id number] Number: Other Clinician: Date of Birth/Sex: 10/28/1950 (65 y.o. 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/06/2016 Yes breakdown of skin I87.332 Chronic venous hypertension (idiopathic) with ulcer and 01/06/2016 Yes inflammation of left lower extremity Inactive Problems Resolved Problems Electronic Signature(s) Signed: 01/07/2016 7:31:48 AM By: [name] MD Entered By: [name] on 01/06/2016 09:53:14 Wolden, [name] ([id number]) -------------------------------------------------------------------------------- Progress Note Details Palmeri, Date of Service: 01/06/2016 9:00 AM Patient Name: [name]  Patient Account Number: [id number] Medical Record Treating RN: Montey Hora [id number] Number: Other Clinician: Date of Birth/Sex: 1950-09-13 (65 y.o. Male) Treating Dellia Nims, MICHAEL Primary Care Physician/Extender: Tonye Pearson Physician: Referring Physician: Doristine Locks in Treatment: 0 Subjective Chief Complaint Information obtained from Patient Patient presents for treatment of an open ulcer due to venous insufficiency. He says he noticed a "into Mark right medial ankle for about 3-4 weeks 01/06/16; patient reaper sensitive today for review of a wound over his left medial ankle there is been present for one week History of Present Illness (HPI) Mark following HPI elements were documented for Mark patient's wound: Location: open wound just above his right ankle Quality: Patient reports experiencing a dull pain to affected area(s). Severity: Patient states wound are getting worse. Duration: Patient has had Mark wound for < 4 weeks prior to presenting for treatment Timing: Pain in wound is Intermittent (comes and goes Context: Mark wound appeared gradually over time Modifying Factors: Consults to this date include: he was seen in [facility] ER and was referred to a vascular surgeon but Mark patient has not done that. He may have been treated with clindamycin in [facility] ER. Associated Signs and Symptoms: Patient reports having difficulty standing for long periods. 65 year old gentleman who was seen in [facility] emergency department recently on 01/06/2015 for a wound of his right lower extremity which he says was not involving any injury and he did not know how he sustained it. He had draining foul-smelling liquid from [facility] area and had gone for care there. his past medical history is significant for DVT, hypertension, gout, tobacco abuse, cocaine abuse, stroke, atrial fibrillation, pulmonary embolism. he has also had some vascular surgery with a stent placed in his leg. He has been a smoker for  many years and has given up straight drugs several years ago. He continues to smoke about 4-5 cigarettes a day. 02/03/2015 -- received a note from 05/14/2013 where Dr. Leotis Pain placed an inferior vena cava filter. Mark patient had a deep vein thrombosis while therapeutic on anticoagulation for previous DVT and a IVC filter was placed for this. 02/10/2015 -- he did have his vascular test done on Friday but we have no reports yet. 02/17/2015 -- notes were reviewed from [facility] vascular office and Mark patient had a venous ultrasound done which revealed that he had no reflux in Mark greater saphenous vein  or Mark short saphenous vein bilaterally. He did have subacute DVT in Mark common femoral vein and popliteal veins on Mark right and left side. Mark recommendation was to continue with Unna's boot therapy at [facility] wound clinic and then to wear graduated Withey, Callaghan ([id number]) compression stockings once Mark ulcers healed and later if he had continuous problems lymphedema pump would benefit him. 03/17/2015 -- we have applied for his insurance and aide regarding cellular tissue-based products and are still awaiting Mark final clearance. 03/24/2015 -- he has had Apligraf authorized for him but his wound is looking so good today that we may not use it. 03/31/2015 -- he has not yet received his compression stockings though we have called a couple of times and hopefully they should arrive this week. READMISSION 01/06/16; this is a patient we have previously cared for in this clinic with wounds on his right medial ankle. I was not previously involved with his care. 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 He wears compression stockings. He had reflux studies done during his last stay in this clinic which did not show significant reflux in Mark greater or lesser saphenous veins bilaterally. His history is that he  developed a open sore on Mark left medial malleolus one week ago. He was seen in his primary physician office and given a course of doxycycline which he still should be on. Previously seen vascular surgery who felt that he had some degree of lymphedema as well. He is not a diabetic Wound History Patient presents with 1 open wound that has been present for approximately 1 week. Patient has been treating wound in Mark following manner: vaseline. Laboratory tests have not been performed in Mark last month. Patient reportedly has not tested positive for an antibiotic resistant organism. Patient reportedly has not tested positive for osteomyelitis. Patient reportedly has had testing performed to evaluate circulation in Mark legs. Patient experiences Mark following problems associated with their wounds: swelling. Patient History Information obtained from Patient. Allergies No Known Allergies Family History Heart Disease - Mother, Father, Hypertension - Mother, Father, Stroke - Father, No family history of Cancer, Diabetes, Hereditary Spherocytosis, Thyroid Problems, Tuberculosis. Social History Current every day smoker, Marital Status - Single, Alcohol Use - Never, Drug Use - No History, Caffeine Use - Daily. Medical History Hospitalization/Surgery History - 05/17/2012, [facility], CVA. Medical And Surgical History Notes Ear/Nose/Mouth/Throat difficulty speaking Neurologic CVA in 2014 [place], [place] ([id number]) Review of Systems (ROS) Constitutional Symptoms (General Health) Mark patient has no complaints or symptoms. Eyes Mark patient has no complaints or symptoms. Ear/Nose/Mouth/Throat Mark patient has no complaints or symptoms. Hematologic/Lymphatic Mark patient has no complaints or symptoms. Respiratory Mark patient has no complaints or symptoms. Gastrointestinal Mark patient has no complaints or symptoms. Endocrine Mark patient has no complaints or symptoms. Genitourinary Mark patient has  no complaints or symptoms. Immunological Mark patient has no complaints or symptoms. Integumentary (Skin) Mark patient has no complaints or symptoms. Oncologic Mark patient has no complaints or symptoms. Psychiatric Mark patient has no complaints or symptoms. Objective Constitutional Patient is hypertensive.[name] Heart rate irregular.[name] Respirations regular, non-labored and within target range.. Temperature is normal and within Mark target range for Mark patient.. Patient appeared stable. Vitals Time Taken: 8:54 AM, Height: 74 in, Source: Stated, Weight: 240 lbs, Source: Stated, BMI: 30.8, Temperature: 97.8 F, Pulse: 80 bpm, Respiratory Rate: 20 breaths/min, Blood Pressure:  159/92 mmHg. Respiratory Respiratory effort is easy and symmetric bilaterally. Rate is normal at rest and on room air.. Bilateral breath sounds are clear and equal in all lobes with no wheezes, rales or rhonchi.. Cardiovascular A. fib no S3. Pedal pulses palpable and strong bilaterally.. Gastrointestinal (GI) Allmon, Jais ([id number]) Abdomen is soft and non-distended without masses or tenderness. Bowel sounds active in all quadrants.. No liver or spleen enlargement or tenderness.. Lymphatic None palpable in Mark popliteal or inguinal areas. Musculoskeletal No difficulties noted with Mark ankle joint on Mark left. General Notes: Wound exam; small wound with some depth over Mark medial malleolus on Mark left. There is some surrounding erythema I wonder if this is venous inflammation rather than true cellulitis although I did not alter Mark doxycycline he is currently on. His edema appears to be well controlled Integumentary (Hair, Skin) Patient has some degree of venous insufficiency but his edema appeared to be well controlled. Wound #2 status is Open. Original cause of wound was Trauma. Mark wound is located on Mark Left,Medial Malleolus. Mark wound measures 0.5cm length x 0.9cm width x 0.2cm depth; 0.353cm^2 area  and 0.071cm^3 volume. Mark wound is limited to skin breakdown. There is no tunneling or undermining noted. There is a medium amount of serous drainage noted. Mark wound margin is flat and intact. There is no granulation within Mark wound bed. There is a large (67-100%) amount of necrotic tissue within Mark wound bed including Eschar and Adherent Slough. Mark periwound skin appearance exhibited: Erythema. Mark periwound skin appearance did not exhibit: Callus, Crepitus, Excoriation, Fluctuance, Friable, Induration, Localized Edema, Rash, Scarring, Dry/Scaly, Maceration, Moist, Atrophie Blanche, Cyanosis, Ecchymosis, Hemosiderin Staining, Mottled, Pallor, Rubor. Mark surrounding wound skin color is noted with erythema which is circumferential. Periwound temperature was noted as No Abnormality. Mark periwound has tenderness on palpation. Assessment Active Problems ICD-10 L97.321 - Non-pressure chronic ulcer of left ankle limited to breakdown of skin I87.332 - Chronic venous hypertension (idiopathic) with ulcer and inflammation of left lower extremity Plan Wound Cleansing: Wound #2 Left,Medial Malleolus: Clean wound with Normal Saline. Baines, [name] ([id number]) Anesthetic: Wound #2 Left,Medial Malleolus: Topical Lidocaine 4% cream applied to wound bed prior to debridement Primary Wound Dressing: Wound #2 Left,Medial Malleolus: Prisma Ag Secondary Dressing: Wound #2 Left,Medial Malleolus: ABD pad Dressing Change Frequency: Wound #2 Left,Medial Malleolus: Change dressing every week Follow-up Appointments: Wound #2 Left,Medial Malleolus: Return Appointment in 1 week. Edema Control: Wound #2 Left,Medial Malleolus: 4-Layer Compression System - Right Lower Extremity Elevate legs to Mark level of Mark heart and pump ankles as often as possible - and when sitting Additional Orders / Instructions: Wound #2 Left,Medial Malleolus: Increase protein intake. We use Prisma,ABD, uncer a profore  wrap continue doxycycline, no cultures were done Electronic Signature(s) Signed: 01/07/2016 7:31:48 AM By: [name] MD Entered By: [name] on 01/06/2016 10:07:34 Legacy, [name] ([id number]) -------------------------------------------------------------------------------- ROS/PFSH Details Duplantis, Date of Service: 01/06/2016 9:00 AM Patient Name: [name] Patient Account Number: [id number] Medical Record Treating RN: Montey Hora [id number] Number: Other Clinician: Date of Birth/Sex: 08/31/1950 (65 y.o. Male) Treating [name], MICHAEL Primary Care Physician/Extender: Tonye Pearson Physician: Referring Physician: Doristine Locks in Treatment: 0 Information Obtained From Patient Wound History Do you currently have one or more open woundso Yes How many open wounds do you currently haveo 1 Approximately how long have you had your woundso 1 week How have you been treating your wound(s) until nowo vaseline Has your wound(s) ever healed and then re-openedo No Have you had  any lab work done in Mark past montho No Have you tested positive for an antibiotic resistant organism (MRSA, VRE)o No Have you tested positive for osteomyelitis (bone infection)o No Have you had any tests for circulation on your legso Yes Who ordered Mark testo [facility] Where was Mark test doneo [facility] Have you had other problems associated with your woundso Swelling Constitutional Symptoms (General Health) Complaints and Symptoms: No Complaints or Symptoms Eyes Complaints and Symptoms: No Complaints or Symptoms Ear/Nose/Mouth/Throat Complaints and Symptoms: No Complaints or Symptoms Medical History: Past Medical History Notes: difficulty speaking Hematologic/Lymphatic Zoll, [name] ([id number]) Complaints and Symptoms: No Complaints or Symptoms Respiratory Complaints and Symptoms: No Complaints or Symptoms Cardiovascular Medical History: Positive for: Arrhythmia - a fib;  Hypertension Gastrointestinal Complaints and Symptoms: No Complaints or Symptoms Endocrine Complaints and Symptoms: No Complaints or Symptoms Genitourinary Complaints and Symptoms: No Complaints or Symptoms Immunological Complaints and Symptoms: No Complaints or Symptoms Integumentary (Skin) Complaints and Symptoms: No Complaints or Symptoms Musculoskeletal Medical History: Positive for: Gout Neurologic Medical History: Positive for: Neuropathy Past Medical History Notes: CVA in 2014 Oncologic Scalici, AUBRA ([id number]) Complaints and Symptoms: No Complaints or Symptoms Medical History: Negative for: Received Chemotherapy; Received Radiation Psychiatric Complaints and Symptoms: No Complaints or Symptoms Immunizations Immunization Notes: up to date Hospitalization / Surgery History Name of Hospital Purpose of Hospitalization/Surgery Date [facility] 05/17/2012 Family and Social History Cancer: No; Diabetes: No; Heart Disease: Yes - Mother, Father; Hereditary Spherocytosis: No; Hypertension: Yes - Mother, Father; Stroke: Yes - Father; Thyroid Problems: No; Tuberculosis: No; Current every day smoker; Marital Status - Single; Alcohol Use: Never; Drug Use: No History; Caffeine Use: Daily; Financial Concerns: No; Food, Clothing or Shelter Needs: No; Support System Lacking: No; Transportation Concerns: No; Advanced Directives: No; Patient does not want information on Advanced Directives Electronic Signature(s) Signed: 01/06/2016 4:39:07 PM By: Montey Hora Signed: 01/07/2016 7:31:48 AM By: [name] MD Entered By: Montey Hora on 01/06/2016 08:59:57 Ogas, [name] ([id number]) -------------------------------------------------------------------------------- SuperBill Details Kravitz, Date of Service: 01/06/2016 Patient Name: [facility] Patient Account Number: [id number] Medical Record Treating RN: Montey Hora [id number] Number: Other Clinician: Date of  Birth/Sex: 09-Oct-1950 (65 y.o. Male) Treating [name], MICHAEL Primary Care Physician/Extender: Tonye Pearson Physician: Suella [place] in Treatment: 0 Referring Physician: Royetta Crochet Diagnosis Coding ICD-10 Codes Code Description F81.829 Non-pressure chronic ulcer of left ankle limited to breakdown of skin Chronic venous hypertension (idiopathic) with ulcer and inflammation of left lower I87.332 extremity Facility Procedures CPT4 Code: [id number] Description: 99213 - WOUND CARE VISIT-LEV 3 EST PT Modifier: Quantity: 1 Physician Procedures CPT4 Code Description: [id number] 99214 - WC PHYS LEVEL 4 - EST PT ICD-10 Description Diagnosis 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

## 2016-01-13 ENCOUNTER — Encounter: Payer: Medicare Other | Admitting: Internal Medicine

## 2016-01-13 DIAGNOSIS — I87332 Chronic venous hypertension (idiopathic) with ulcer and inflammation of left lower extremity: Secondary | ICD-10-CM | POA: Diagnosis not present

## 2016-01-13 NOTE — Progress Notes (Addendum)
RAIDYN, WASSINK ([id number]) Visit Report for 01/13/2016 Arrival Information Details Flessner, Date of Service: 01/13/2016 2:15 PM Patient Name: [facility] Patient Account Number: [id number] Medical Record Treating RN: Baruch Gouty, RN, [name], Velva Harman [id number] Number: Other Clinician: Date of Birth/Sex: 12-Jan-1951 (65 y.o. Male) Treating Dellia Nims, MICHAEL Primary Care Physician/Extender: Tonye Pearson Physician: Referring Physician: Doristine Locks in Treatment: 1 Visit Information History Since Last Visit All ordered tests and consults were completed: No Patient Arrived: Ambulatory Added or deleted any medications: No Arrival Time: 14:29 Any new allergies or adverse reactions: No Accompanied By: self Had a fall or experienced change in No Transfer Assistance: None activities of daily living that may affect Patient Identification Verified: Yes risk of falls: Secondary Verification Process Yes Signs or symptoms of abuse/neglect since last No Completed: visito Patient Has Alerts: Yes Hospitalized since last visit: No Patient Alerts: Patient on Blood Has Dressing in Place as Prescribed: Yes Thinner Pain Present Now: No warfarin Electronic Signature(s) Signed: 01/13/2016 3:26:52 PM By: [name] [name], RN Entered By: [name] on 01/13/2016 14:30:13 Ravert, [name] ([id number]) -------------------------------------------------------------------------------- Encounter Discharge Information Details Picardi, Date of Service: 01/13/2016 2:15 PM Patient Name: [facility] Patient Account Number: [id number] Medical Record Treating RN: Baruch Gouty, RN, [name], Velva Harman [id number] Number: Other Clinician: Date of Birth/Sex: 03/22/1951 (65 y.o. Male) Treating [name], MICHAEL Primary Care Physician/Extender: Tonye Pearson Physician: Referring Physician: Doristine Locks in Treatment: 1 Encounter Discharge Information Items Discharge Pain Level: 0 Discharge Condition: Stable Ambulatory  Status: Ambulatory Discharge Destination: Home Transportation: Private Auto Accompanied By: self Schedule Follow-up Appointment: No Medication Reconciliation completed and provided to Patient/Care No Anzel Kearse: Provided on Clinical Summary of Care: 01/13/2016 Form Type Recipient Paper Patient wd Electronic Signature(s) Signed: 01/13/2016 3:13:03 PM By: [name] [name], RN Previous Signature: 01/13/2016 3:09:22 PM Version By: Lorine Bears RCP, RRT, CHT Entered By: [name] on 01/13/2016 15:13:03 Kumagai, [name] ([id number]) -------------------------------------------------------------------------------- Lower Extremity Assessment Details Colbaugh, Date of Service: 01/13/2016 2:15 PM Patient Name: [facility] Patient Account Number: [id number] Medical Record Treating RN: Baruch Gouty, RN, [name], Velva Harman [id number] Number: Other Clinician: Date of Birth/Sex: 17-Oct-1950 (65 y.o. Male) Treating Dellia Nims, MICHAEL Primary Care Physician/Extender: Tonye Pearson Physician: Referring Physician: Doristine Locks in Treatment: 1 Edema Assessment Assessed: [Left: No] [Right: No] E[Left: dema] [Right: :] Calf Left: Right: Point of Measurement: 40 cm From Medial Instep 44 cm cm Ankle Left: Right: Point of Measurement: 13 cm From Medial Instep 31.3 cm cm Vascular Assessment Claudication: Claudication Assessment [Left:None] Pulses: Posterior Tibial Extremity colors, hair growth, and conditions: Extremity Color: [Left:Normal] Hair Growth on Extremity: [Left:No] Temperature of Extremity: [Left:Warm] Capillary Refill: [Left:< 3 seconds] Toe Nail Assessment Left: Right: Thick: Yes Discolored: Yes Deformed: No Improper Length and Hygiene: No Electronic Signature(s) Signed: 01/13/2016 3:26:52 PM By: [name] [name], RN Piscitello, [place] ([id number]) Entered By: [name] on 01/13/2016 14:35:47 Haque, [name]  ([id number]) -------------------------------------------------------------------------------- Multi Wound Chart Details Reiber, Date of Service: 01/13/2016 2:15 PM Patient Name: [name] Patient Account Number: [id number] Medical Record Treating RN: Baruch Gouty RN, [name], Velva Harman [id number] Number: Other Clinician: Date of Birth/Sex: [birth year] (65 y.o. Male) Treating [name], MICHAEL Primary Care Physician/Extender: Tonye Pearson Physician: Referring Physician: Doristine Locks in Treatment: 1 Vital Signs Height(in): 74 Pulse(bpm): 53 Weight(lbs): 240 Blood Pressure 149/77 (mmHg): Body Mass Index(BMI): 31 Temperature(F): 97.9 Respiratory Rate 19 (breaths/min): Photos: [2:No Photos] [N/A:N/A] Wound Location: [2:Left Malleolus - Medial] [N/A:N/A] Wounding Event: [2:Trauma] [N/A:N/A] Primary Etiology: [2:Venous Leg Ulcer] [N/A:N/A] Comorbid History: [2:Arrhythmia, Hypertension, N/A Gout, Neuropathy] Date Acquired: [  2:12/29/2015] [N/A:N/A] Weeks of Treatment: [2:1] [N/A:N/A] Wound Status: [2:Open] [N/A:N/A] Measurements L x W x D 0.8x0.5x0.2 [N/A:N/A] (cm) Area (cm) : [8:9.211] [N/A:N/A] Volume (cm) : [2:0.063] [N/A:N/A] % Reduction in Area: [2:11.00%] [N/A:N/A] % Reduction in Volume: 11.30% [N/A:N/A] Classification: [2:Full Thickness Without Exposed Support Structures] [N/A:N/A] Exudate Amount: [2:Medium] [N/A:N/A] Exudate Type: [2:Serous] [N/A:N/A] Exudate Color: [2:amber] [N/A:N/A] Wound Margin: [2:Flat and Intact] [N/A:N/A] Granulation Amount: [2:Medium (34-66%)] [N/A:N/A] Granulation Quality: [2:Red, Pink, Pale] [N/A:N/A] Necrotic Amount: [2:Medium (34-66%)] [N/A:N/A] Exposed Structures: [2:Fascia: No Fat: No] [N/A:N/A] Tendon: No Muscle: No Joint: No Bone: No Limited to Skin Breakdown Epithelialization: Small (1-33%) N/A N/A Periwound Skin Texture: Edema: Yes N/A N/A Excoriation: No Induration: No Callus: No Crepitus: No Fluctuance: No Friable:  No Rash: No Scarring: No Periwound Skin Moist: Yes N/A N/A Moisture: Maceration: No Dry/Scaly: No Periwound Skin Color: Hemosiderin Staining: Yes N/A N/A Mottled: Yes Atrophie Blanche: No Cyanosis: No Ecchymosis: No Erythema: No Pallor: No Rubor: No Temperature: No Abnormality N/A N/A Tenderness on Yes N/A N/A Palpation: Wound Preparation: Ulcer Cleansing: N/A N/A Rinsed/Irrigated with Saline, Other: surg scrub and water Topical Anesthetic Applied: Other: lidocaine 4% Treatment Notes Electronic Signature(s) Signed: 01/13/2016 3:26:52 PM By: [name] [name], RN Entered By: [name] on 01/13/2016 14:41:04 Albor, [name] ([id number]) -------------------------------------------------------------------------------- Multi-Disciplinary Care Plan Details Wass, Date of Service: 01/13/2016 2:15 PM Patient Name: [facility] Patient Account Number: [id number] Medical Record Treating RN: Baruch Gouty, RN, [name], Velva Harman [id number] Number: Other Clinician: Date of Birth/Sex: 1951-02-27 (65 y.o. Male) Treating [name], MICHAEL Primary Care Physician/Extender: Tonye Pearson Physician: Referring Physician: Doristine Locks in Treatment: 1 Active Inactive Orientation to the Wound Care Program Nursing Diagnoses: Knowledge deficit related to the wound healing center program Goals: Patient/caregiver will verbalize understanding of the [facility] Program Date Initiated: 01/06/2016 Goal Status: Active Interventions: Provide education on orientation to the wound center Notes: Venous Leg Ulcer Nursing Diagnoses: Actual venous Insuffiency (use after diagnosis is confirmed) Goals: Patient will maintain optimal edema control Date Initiated: 01/06/2016 Goal Status: Active Interventions: Compression as ordered Notes: Wound/Skin Impairment Nursing Diagnoses: Knowledge deficit related to ulceration/compromised skin integrity Clifton, [name]  ([id number]) Goals: Patient/caregiver will verbalize understanding of skin care regimen Date Initiated: 01/06/2016 Goal Status: Active Ulcer/skin breakdown will have a volume reduction of 30% by week 4 Date Initiated: 01/06/2016 Goal Status: Active Ulcer/skin breakdown will have a volume reduction of 50% by week 8 Date Initiated: 01/06/2016 Goal Status: Active Ulcer/skin breakdown will have a volume reduction of 80% by week 12 Date Initiated: 01/06/2016 Goal Status: Active Ulcer/skin breakdown will heal within 14 weeks Date Initiated: 01/06/2016 Goal Status: Active Interventions: Assess patient/caregiver ability to obtain necessary supplies Assess patient/caregiver ability to perform ulcer/skin care regimen upon admission and as needed Assess ulceration(s) every visit Notes: Electronic Signature(s) Signed: 01/13/2016 3:26:52 PM By: [name] [name], RN Entered By: [name] on 01/13/2016 14:40:45 Klare, [name] ([id number]) -------------------------------------------------------------------------------- Pain Assessment Details Burpee, Date of Service: 01/13/2016 2:15 PM Patient Name: [name] Patient Account Number: [id number] Medical Record Treating RN: Baruch Gouty RN, [name], Velva Harman [id number] Number: Other Clinician: Date of Birth/Sex: October 22, 1950 (65 y.o. Male) Treating [name] Primary Care Physician/Extender: Tonye Pearson Physician: Referring Physician: Doristine Locks in Treatment: 1 Active Problems Location of Pain Severity and Description of Pain Patient Has Paino No Site Locations With Dressing Change: No Pain Management and Medication Current Pain Management: Electronic Signature(s) Signed: 01/13/2016 3:26:52 PM By: [name] [name], RN Entered By: [name] on 01/13/2016 14:30:23 Davis, [name] ([id number]) --------------------------------------------------------------------------------  Patient/Caregiver Education Details Defreitas, Date of  Service: 01/13/2016 2:15 PM Patient Name: [facility] Patient Account Number: [id number] Medical Record Treating RN: Afful, RN, [name], Velva Harman [id number] Number: Other Clinician: Date of Birth/Gender: December 18, 1950 (65 y.o. Male) Treating [name], MICHAEL Primary Care Physician/Extender: Tonye Pearson Physician: Suella Grove in Treatment: 1 Referring Physician: Royetta Crochet Education Assessment Education Provided To: Patient Education Topics Provided Welcome To [facility]: Methods: Explain/Verbal Responses: State content correctly Wound Debridement: Methods: Explain/Verbal Responses: State content correctly Electronic Signature(s) Signed: 01/13/2016 3:26:52 PM By: [name] [name], RN Entered By: [name] on 01/13/2016 15:13:19 Wade, [name] ([id number]) -------------------------------------------------------------------------------- Wound Assessment Details Fairley, Date of Service: 01/13/2016 2:15 PM Patient Name: [name] Patient Account Number: [id number] Medical Record Treating RN: Baruch Gouty, RN, [name], Velva Harman [id number] Number: Other Clinician: Date of Birth/Sex: 1950-07-30 (65 y.o. Male) Treating [name], MICHAEL Primary Care Physician/Extender: Tonye Pearson Physician: Referring Physician: Doristine Locks in Treatment: 1 Wound Status Wound Number: 2 Primary Venous Leg Ulcer Etiology: Wound Location: Left Malleolus - Medial Wound Status: Open Wounding Event: Trauma Comorbid Arrhythmia, Hypertension, Gout, Date Acquired: 12/29/2015 History: Neuropathy Weeks Of Treatment: 1 Clustered Wound: No Photos Photo Uploaded By: [name] on 01/13/2016 15:22:58 Wound Measurements Length: (cm) 0.8 Width: (cm) 0.5 Depth: (cm) 0.2 Area: (cm) 0.314 Volume: (cm) 0.063 % Reduction in Area: 11% % Reduction in Volume: 11.3% Epithelialization: Small (1-33%) Tunneling: No Undermining: No Wound Description Full Thickness Without Exposed Foul Odor  Aft Classification: Support Structures Mandarino, [name] ([id number]) er Cleansing: No Wound Margin: Flat and Intact Exudate Medium Amount: Exudate Type: Serous Exudate Color: amber Wound Bed Granulation Amount: Medium (34-66%) Exposed Structure Granulation Quality: Red, Pink, Pale Fascia Exposed: No Necrotic Amount: Medium (34-66%) Fat Layer Exposed: No Necrotic Quality: Adherent Slough Tendon Exposed: No Muscle Exposed: No Joint Exposed: No Bone Exposed: No Limited to Skin Breakdown Periwound Skin Texture Texture Color No Abnormalities Noted: No No Abnormalities Noted: No Callus: No Atrophie Blanche: No Crepitus: No Cyanosis: No Excoriation: No Ecchymosis: No Fluctuance: No Erythema: No Friable: No Hemosiderin Staining: Yes Induration: No Mottled: Yes Localized Edema: Yes Pallor: No Rash: No Rubor: No Scarring: No Temperature / Pain Moisture Temperature: No Abnormality No Abnormalities Noted: No Tenderness on Palpation: Yes Dry / Scaly: No Maceration: No Moist: Yes Wound Preparation Ulcer Cleansing: Rinsed/Irrigated with Saline, Other: surg scrub and water, Topical Anesthetic Applied: Other: lidocaine 4%, Treatment Notes Wound #2 (Left, Medial Malleolus) 1. Cleansed with: Cleanse wound with antibacterial soap and water 3. Peri-wound Care: Moisturizing lotion 4. Dressing Applied: Ellwood Steidle, [name] ([id number]) 5. Secondary Dressing Applied Dry Gauze 7. Secured with 4-Layer Compression System - Left Lower Extremity Electronic Signature(s) Signed: 01/13/2016 3:26:52 PM By: [name] [name], RN Entered By: [name] on 01/13/2016 14:40:39 Berrones, [name] ([id number]) -------------------------------------------------------------------------------- Vitals Details Denslow, Date of Service: 01/13/2016 2:15 PM Patient Name: [facility] Patient Account Number: [id number] Medical Record Treating RN: Baruch Gouty, RN, [name],  Velva Harman [id number] Number: Other Clinician: Date of Birth/Sex: 07-19-1950 (65 y.o. Male) Treating [name], MICHAEL Primary Care Physician/Extender: Tonye Pearson Physician: Referring Physician: Doristine Locks in Treatment: 1 Vital Signs Time Taken: 14:30 Temperature (F): 97.9 Height (in): 74 Pulse (bpm): 53 Weight (lbs): 240 Respiratory Rate (breaths/min): 19 Body Mass Index (BMI): 30.8 Blood Pressure (mmHg): 149/77 Reference Range: 80 - 120 mg / dl Electronic Signature(s) Signed: 01/13/2016 3:26:52 PM By: [name] [name], RN Entered By: [name] on 01/13/2016 14:33:04

## 2016-01-15 NOTE — Progress Notes (Signed)
DAMYAN, CORNE ([id number]) Visit Report for 01/13/2016 Chief Complaint Document Details Bottomley, Date of Service: 01/13/2016 2:15 PM Patient Name: [facility] Patient Account Number: [id number] Medical Record Treating RN: Baruch Gouty RN, [name], Velva Harman [id number] Number: Other Clinician: Date of Birth/Sex: [birth year] (65 y.o. Male) Treating Dellia Nims, [place] Primary Care Physician/Extender: Tonye Pearson Physician: Referring Physician: Doristine Locks in Treatment: 1 Information Obtained from: Patient Chief Complaint Patient presents for treatment of an open ulcer due to venous insufficiency. He says he noticed a "into the right medial ankle for about 3-4 weeks 01/06/16; patient reaper sensitive today for review of a wound over his left medial ankle there is been present for one week Electronic Signature(s) Signed: 01/14/2016 4:32:31 PM By: [name] MD Entered By: [name] on 01/14/2016 07:52:21 Henricksen, [name] ([id number]) -------------------------------------------------------------------------------- Debridement Details Czaplicki, Date of Service: 01/13/2016 2:15 PM Patient Name: [name] Patient Account Number: [id number] Medical Record Treating RN: Baruch Gouty, RN, [name], Velva Harman [id number] Number: Other Clinician: Date of Birth/Sex: 04/14/1951 (65 y.o. Male) Treating [name], MICHAEL Primary Care Physician/Extender: Tonye Pearson Physician: Referring Physician: Doristine Locks in Treatment: 1 Debridement Performed for Wound #2 Left,Medial Malleolus Assessment: Performed By: Physician Ricard Dillon, MD Debridement: Open Wound/Selective Debridement Selective Description: Pre-procedure Yes - 14:52 Verification/Time Out Taken: Start Time: 14:52 Pain Control: Lidocaine 4% Topical Solution Level: Non-Viable Tissue Total Area Debrided (L x 0.8 (cm) x 0.5 (cm) = 0.4 (cm) W): Tissue and other Fibrin/Slough, Subcutaneous material debrided: Instrument:  Blade Bleeding: Minimum Hemostasis Achieved: Pressure End Time: 14:55 Procedural Pain: 0 Post Procedural Pain: 0 Response to Treatment: Procedure was tolerated well Post Debridement Measurements of Total Wound Length: (cm) 0.8 Width: (cm) 0.5 Depth: (cm) 0.2 Volume: (cm) 0.063 Character of Wound/Ulcer Post Improved Debridement: Severity of Tissue Post Debridement: Limited to breakdown of skin Post Procedure Diagnosis Same as Pre-procedure KEE, DRUDGE ([id number]) Electronic Signature(s) Signed: 01/14/2016 4:32:31 PM By: [name] MD Signed: 01/14/2016 4:38:41 PM By: [name] [name], RN Previous Signature: 01/13/2016 3:26:52 PM Version By: [name] [name], RN Entered By: [name] on 01/14/2016 07:51:59 Roscoe, [name] ([id number]) -------------------------------------------------------------------------------- HPI Details Stolarz, Date of Service: 01/13/2016 2:15 PM Patient Name: [facility] Patient Account Number: [id number] Medical Record Treating RN: Baruch Gouty RN, [name], Velva Harman [id number] Number: Other Clinician: Date of Birth/Sex: 1950-06-30 (65 y.o. Male) Treating [name], MICHAEL Primary Care Physician/Extender: Tonye Pearson Physician: Referring Physician: Doristine Locks in Treatment: 1 History of Present Illness Location: open wound just above his right ankle Quality: Patient reports experiencing a dull pain to affected area(s). Severity: Patient states wound are getting worse. Duration: Patient has had the wound for < 4 weeks prior to presenting for treatment Timing: Pain in wound is Intermittent (comes and goes Context: The wound appeared gradually over time Modifying Factors: Consults to this date include: he was seen in the ER and was referred to a vascular surgeon but the patient has not done that. He may have been treated with clindamycin in the ER. Associated Signs and Symptoms: Patient reports having difficulty standing for long  periods. HPI Description: 65 year old gentleman who was seen in the emergency department recently on 01/06/2015 for a wound of his right lower extremity which he says was not involving any injury and he did not know how he sustained it. He had draining foul-smelling liquid from the area and had gone for care there. his past medical history is significant for DVT, hypertension, gout, tobacco abuse, cocaine abuse, stroke, atrial fibrillation, pulmonary embolism. he has also had some  vascular surgery with a stent placed in his leg. He has been a smoker for many years and has given up straight drugs several years ago. He continues to smoke about 4-5 cigarettes a day. 02/03/2015 -- received a note from 05/14/2013 where Dr. Leotis Pain placed an inferior vena cava filter. The patient had a deep vein thrombosis while therapeutic on anticoagulation for previous DVT and a IVC filter was placed for this. 02/10/2015 -- he did have his vascular test done on Friday but we have no reports yet. 02/17/2015 -- notes were reviewed from the vascular office and the patient had a venous ultrasound done which revealed that he had no reflux in the greater saphenous vein or the short saphenous vein bilaterally. He did have subacute DVT in the common femoral vein and popliteal veins on the right and left side. The recommendation was to continue with Unna's boot therapy at the wound clinic and then to wear graduated compression stockings once the ulcers healed and later if he had continuous problems lymphedema pump would benefit him. 03/17/2015 -- we have applied for his insurance and aide regarding cellular tissue-based products and are still awaiting the final clearance. 03/24/2015 -- he has had Apligraf authorized for him but his wound is looking so good today that we may not use it. 03/31/2015 -- he has not yet received his compression stockings though we have called a couple of times and hopefully they should  arrive this week. ILLYA, GIENGER ([id number]) [place] 01/06/16; this is a patient we have previously cared for in this clinic with wounds on his right medial ankle. I was not previously involved with his care. 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 He wears compression stockings. He had reflux studies done during his last stay in this clinic which did not show significant reflux in the greater or lesser saphenous veins bilaterally. His history is that he developed a open sore on the left medial malleolus one week ago. He was seen in his primary physician office and given a course of doxycycline which he still should be on. Previously seen vascular surgery who felt that he had some degree of lymphedema as well. He is not a diabetic 01/13/16 no major change Electronic Signature(s) Signed: 01/14/2016 4:32:31 PM By: [name] MD Entered By: [name] on 01/14/2016 07:52:59 Dommer, [name] ([id number]) -------------------------------------------------------------------------------- Physical Exam Details Madan, Date of Service: 01/13/2016 2:15 PM Patient Name: [facility] Patient Account Number: [id number] Medical Record Treating RN: Baruch Gouty RN, [name], Velva Harman [id number] Number: Other Clinician: Date of Birth/Sex: 10/12/1950 (65 y.o. Male) Treating [name], MICHAEL Primary Care Physician/Extender: Tonye Pearson Physician: Referring Physician: Doristine Locks in Treatment: 1 Constitutional Sitting or standing Blood Pressure is within target range for patient.. Pulse regular and within target range for patient.[name] Respirations regular, non-labored and within target range.. Temperature is normal and within the target range for the patient.. Notes Wound exam; small wound with not much surface area however some depth and a tightly adherent surface slough over the base of the  wound. This was debrided with some subcutaneous tissue. Some surrounding erythema which I think is venous inflammation. This does not appear to be unstable Electronic Signature(s) Signed: 01/14/2016 4:32:31 PM By: [name] MD Entered By: [name] on 01/14/2016 07:54:03 Fluty, [name] ([id number]) -------------------------------------------------------------------------------- Physician Orders Details Erb, Date of Service: 01/13/2016 2:15 PM Patient Name: [name] Patient  Account Number: [id number] Medical Record Treating RN: Baruch Gouty RN, [name], Velva Harman [id number] Number: Other Clinician: Date of Birth/Sex: [birth year] (65 y.o. Male) Treating [name], MICHAEL Primary Care Physician/Extender: Tonye Pearson Physician: Referring Physician: Doristine Locks in Treatment: 1 Verbal / Phone Orders: Yes Clinician: Afful, RN, [name], Rita Read Back and Verified: Yes Diagnosis Coding Wound Cleansing Wound #2 Left,Medial Malleolus o Clean wound with Normal Saline. Anesthetic Wound #2 Left,Medial Malleolus o Topical Lidocaine 4% cream applied to wound bed prior to debridement Primary Wound Dressing Wound #2 Left,Medial Malleolus o Prisma Ag Secondary Dressing Wound #2 Left,Medial Malleolus o ABD pad Dressing Change Frequency Wound #2 Left,Medial Malleolus o Change dressing every week Follow-up Appointments Wound #2 Left,Medial Malleolus o Return Appointment in 1 week. Edema Control Wound #2 Left,Medial Malleolus o 4-Layer Compression System - Right Lower Extremity o Elevate legs to the level of the heart and pump ankles as often as possible - and when sitting Additional Orders / Instructions Wound #2 Left,Medial Malleolus o Increase protein intake. ZACKARIAH, VANDERPOL ([id number]) Electronic Signature(s) Signed: 01/13/2016 3:26:52 PM By: [name] [name], RN Signed: 01/14/2016 4:32:31 PM By: [name] MD Entered By: [name] on 01/13/2016  14:54:10 Murtagh, [name] ([id number]) -------------------------------------------------------------------------------- Problem List Details Echevarria, Date of Service: 01/13/2016 2:15 PM Patient Name: [facility] Patient Account Number: [id number] Medical Record Treating RN: Baruch Gouty RN, [name], Velva Harman [id number] Number: Other Clinician: Date of Birth/Sex: 11/03/1950 (65 y.o. 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/06/2016 Yes breakdown of skin I87.332 Chronic venous hypertension (idiopathic) with ulcer and 01/06/2016 Yes inflammation of left lower extremity Inactive Problems Resolved Problems Electronic Signature(s) Signed: 01/14/2016 4:32:31 PM By: [name] MD Entered By: [name] on 01/14/2016 07:51:41 Snowball, [name] ([id number]) -------------------------------------------------------------------------------- Progress Note Details Marullo, Date of Service: 01/13/2016 2:15 PM Patient Name: [name] Patient Account Number: [id number] Medical Record Treating RN: Baruch Gouty RN, [name], Velva Harman [id number] Number: Other Clinician: Date of Birth/Sex: 1950/08/09 (65 y.o. Male) Treating Dellia Nims, MICHAEL Primary Care Physician/Extender: Tonye Pearson Physician: Referring Physician: Doristine Locks in Treatment: 1 Subjective Chief Complaint Information obtained from Patient Patient presents for treatment of an open ulcer due to venous insufficiency. He says he noticed a "into the right medial ankle for about 3-4 weeks 01/06/16; patient reaper sensitive today for review of a wound over his left medial ankle there is been present for one week History of Present Illness (HPI) The following HPI elements were documented for the patient's  wound: Location: open wound just above his right ankle Quality: Patient reports experiencing a dull pain to affected area(s). Severity: Patient states wound are getting worse. Duration: Patient has had the wound for < 4 weeks prior to presenting for treatment Timing: Pain in wound is Intermittent (comes and goes Context: The wound appeared gradually over time Modifying Factors: Consults to this date include: he was seen in the ER and was referred to a vascular surgeon but the patient has not done that. He may have been treated with clindamycin in the ER. Associated Signs and Symptoms: Patient reports having difficulty standing for long periods. 65 year old gentleman who was seen in the emergency department recently on 01/06/2015 for a wound of his right lower extremity which he says was not involving any injury and he did not know how he sustained it. He had draining foul-smelling liquid from the  area and had gone for care there. his past medical history is significant for DVT, hypertension, gout, tobacco abuse, cocaine abuse, stroke, atrial fibrillation, pulmonary embolism. he has also had some vascular surgery with a stent placed in his leg. He has been a smoker for many years and has given up straight drugs several years ago. He continues to smoke about 4-5 cigarettes a day. 02/03/2015 -- received a note from 05/14/2013 where Dr. Leotis Pain placed an inferior vena cava filter. The patient had a deep vein thrombosis while therapeutic on anticoagulation for previous DVT and a IVC filter was placed for this. 02/10/2015 -- he did have his vascular test done on Friday but we have no reports yet. 02/17/2015 -- notes were reviewed from the vascular office and the patient had a venous ultrasound done which revealed that he had no reflux in the greater saphenous vein or the short saphenous vein bilaterally. He did have subacute DVT in the common femoral vein and popliteal veins on the right and left  side. The recommendation was to continue with Unna's boot therapy at the wound clinic and then to wear graduated Wyer, Yoshiharu ([id number]) compression stockings once the ulcers healed and later if he had continuous problems lymphedema pump would benefit him. 03/17/2015 -- we have applied for his insurance and aide regarding cellular tissue-based products and are still awaiting the final clearance. 03/24/2015 -- he has had Apligraf authorized for him but his wound is looking so good today that we may not use it. 03/31/2015 -- he has not yet received his compression stockings though we have called a couple of times and hopefully they should arrive this week. READMISSION 01/06/16; this is a patient we have previously cared for in this clinic with wounds on his right medial ankle. I was not previously involved with his care. 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 He wears compression stockings. He had reflux studies done during his last stay in this clinic which did not show significant reflux in the greater or lesser saphenous veins bilaterally. His history is that he developed a open sore on the left medial malleolus one week ago. He was seen in his primary physician office and given a course of doxycycline which he still should be on. Previously seen vascular surgery who felt that he had some degree of lymphedema as well. He is not a diabetic 01/13/16 no major change Objective Constitutional Sitting or standing Blood Pressure is within target range for patient.. Pulse regular and within target range for patient.[name] Respirations regular, non-labored and within target range.. Temperature is normal and within the target range for the patient.. Vitals Time Taken: 2:30 PM, Height: 74 in, Weight: 240 lbs, BMI: 30.8, Temperature: 97.9 F, Pulse: 53 bpm, Respiratory Rate: 19 breaths/min, Blood  Pressure: 149/77 mmHg. General Notes: Wound exam; small wound with not much surface area however some depth and a tightly adherent surface slough over the base of the wound. This was debrided with some subcutaneous tissue. Some surrounding erythema which I think is venous inflammation. This does not appear to be unstable Integumentary (Hair, Skin) Wound #2 status is Open. Original cause of wound was Trauma. The wound is located on the Left,Medial Malleolus. The wound measures 0.8cm length x 0.5cm width x 0.2cm depth; 0.314cm^2 area and 0.063cm^3 volume. The wound is limited to skin breakdown. There is no tunneling or  undermining noted. There is a medium amount of serous drainage noted. The wound margin is flat and intact. There is medium (34-66%) red, pink, pale granulation within the wound bed. There is a medium (34-66%) amount of necrotic tissue within the wound bed including Adherent Slough. The periwound skin appearance exhibited: Localized Edema, Moist, Hemosiderin Staining, Mottled. The periwound skin appearance did not exhibit: Callus, Crepitus, Excoriation, Fluctuance, Friable, Induration, Rash, Scarring, Dry/Scaly, Maceration, Sole, Florencio ([id number]) Atrophie Blanche, Cyanosis, Ecchymosis, Pallor, Rubor, Erythema. Periwound temperature was noted as No Abnormality. The periwound has tenderness on palpation. 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 There was a Non-Viable Tissue Open Wound/Selective [phone]) debridement with total area of 0.4 sq cm performed by Ricard Dillon, MD. with the following instrument(s): Blade including Fibrin/Slough and Subcutaneous after achieving pain control using Lidocaine 4%  Topical Solution. A time out was conducted at 14:52, prior to the start of the procedure. A Minimum amount of bleeding was controlled with Pressure. The procedure was tolerated well with a pain level of 0 throughout and a pain level of 0 following the procedure. Post Debridement Measurements: 0.8cm length x 0.5cm width x 0.2cm depth; 0.063cm^3 volume. Character of Wound/Ulcer Post Debridement is improved. Severity of Tissue Post Debridement is: Limited to breakdown of skin. Post procedure Diagnosis Wound #2: Same as Pre-Procedure Plan Wound Cleansing: Wound #2 Left,Medial Malleolus: Clean wound with Normal Saline. Anesthetic: Wound #2 Left,Medial Malleolus: Topical Lidocaine 4% cream applied to wound bed prior to debridement Primary Wound Dressing: Wound #2 Left,Medial Malleolus: Prisma Ag Grenz, [name] ([id number]) Secondary Dressing: Wound #2 Left,Medial Malleolus: ABD pad Dressing Change Frequency: Wound #2 Left,Medial Malleolus: Change dressing every week Follow-up Appointments: Wound #2 Left,Medial Malleolus: Return Appointment in 1 week. Edema Control: Wound #2 Left,Medial Malleolus: 4-Layer Compression System - Right Lower Extremity Elevate legs to the level of the heart and pump ankles as often as possible - and when sitting Additional Orders / Instructions: Wound #2 Left,Medial Malleolus: Increase protein intake. prisma,abd,profroe Electronic Signature(s) Signed: 01/14/2016 4:32:31 PM By: [name] MD Entered By: [name] on 01/14/2016 07:54:38 Fleek, [name] ([id number]) -------------------------------------------------------------------------------- SuperBill Details Vanepps, Date of Service: 01/13/2016 Patient Name: [facility] Patient Account Number: [id number] Medical Record Treating RN: Baruch Gouty RN, [name], Velva Harman [id number] Number: Other Clinician: Date of Birth/Sex: 08-Nov-1950 (65 y.o. 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

## 2016-01-20 ENCOUNTER — Encounter: Payer: Medicare Other | Attending: Internal Medicine | Admitting: Internal Medicine

## 2016-01-20 DIAGNOSIS — I10 Essential (primary) hypertension: Secondary | ICD-10-CM | POA: Diagnosis not present

## 2016-01-20 DIAGNOSIS — Z7901 Long term (current) use of anticoagulants: Secondary | ICD-10-CM | POA: Insufficient documentation

## 2016-01-20 DIAGNOSIS — Z8673 Personal history of transient ischemic attack (TIA), and cerebral infarction without residual deficits: Secondary | ICD-10-CM | POA: Insufficient documentation

## 2016-01-20 DIAGNOSIS — F17210 Nicotine dependence, cigarettes, uncomplicated: Secondary | ICD-10-CM | POA: Diagnosis not present

## 2016-01-20 DIAGNOSIS — I87332 Chronic venous hypertension (idiopathic) with ulcer and inflammation of left lower extremity: Secondary | ICD-10-CM | POA: Diagnosis present

## 2016-01-20 DIAGNOSIS — M109 Gout, unspecified: Secondary | ICD-10-CM | POA: Insufficient documentation

## 2016-01-20 DIAGNOSIS — I4891 Unspecified atrial fibrillation: Secondary | ICD-10-CM | POA: Diagnosis not present

## 2016-01-20 DIAGNOSIS — Z86718 Personal history of other venous thrombosis and embolism: Secondary | ICD-10-CM | POA: Insufficient documentation

## 2016-01-20 DIAGNOSIS — L97321 Non-pressure chronic ulcer of left ankle limited to breakdown of skin: Secondary | ICD-10-CM | POA: Diagnosis not present

## 2016-01-21 NOTE — Progress Notes (Signed)
[name], [name] ([id number]) Visit Report for 01/20/2016 Chief Complaint Document Details Wooden, Date of Service: 01/20/2016 3:45 PM Patient Name: [name] Patient Account Number: [id number] Medical Record Treating RN: Ahmed Prima [id number] Number: Other Clinician: Date of Birth/Sex: [birth year] (65 y.o. Male) Treating Dellia Nims, MICHAEL Primary Care Physician/Extender: Tonye Pearson Physician: Referring Physician: Doristine Locks in Treatment: 2 Information Obtained from: Patient Chief Complaint Patient presents for treatment of an open ulcer due to venous insufficiency. He says he noticed a "into the right medial ankle for about 3-4 weeks 01/06/16; patient reaper sensitive today for review of a wound over his left medial ankle there is been present for one week Electronic Signature(s) Signed: 01/21/2016 7:55:14 AM By: [name] MD Entered By: [name] on 01/20/2016 15:53:30 [name], [name] ([id number]) -------------------------------------------------------------------------------- Debridement Details Vary, Date of Service: 01/20/2016 3:45 PM Patient Name: [name] Patient Account Number: [id number] Medical Record Treating RN: Ahmed Prima [id number] Number: Other Clinician: Date of Birth/Sex: Feb 25, 1951 (65 y.o. Male) Treating [name], MICHAEL Primary Care Physician/Extender: Tonye Pearson Physician: Referring Physician: Doristine Locks in Treatment: 2 Debridement Performed for Wound #2 Left,Medial Malleolus Assessment: Performed By: Physician Ricard Dillon, MD Debridement: Debridement Pre-procedure Yes - 15:39 Verification/Time Out Taken: Start Time: 15:39 Pain Control: Lidocaine 4% Topical Solution Level: Skin/Subcutaneous Tissue Total Area Debrided (L x 0.7 (cm) x 0.5 (cm) = 0.35 (cm) W): Tissue and other Viable, Non-Viable, Exudate, Fibrin/Slough, Subcutaneous material debrided: Instrument: Curette Bleeding:  Minimum Hemostasis Achieved: Pressure End Time: 15:41 Procedural Pain: 0 Post Procedural Pain: 0 Response to Treatment: Procedure was tolerated well Post Debridement Measurements of Total Wound Length: (cm) 0.7 Width: (cm) 0.5 Depth: (cm) 0.2 Volume: (cm) 0.055 Character of Wound/Ulcer Post Stable Debridement: Severity of Tissue Post Debridement: Fat layer exposed Post Procedure Diagnosis Same as Pre-procedure Electronic Signature(s) Signed: 01/20/2016 4:41:03 PM By: Enriqueta Shutter ([id number]) Signed: 01/21/2016 7:55:14 AM By: [name] MD Entered By: [name] on 01/20/2016 15:53:06 [name], [name] ([id number]) -------------------------------------------------------------------------------- HPI Details [name], Date of Service: 01/20/2016 3:45 PM Patient Name: [name] Patient Account Number: [id number] Medical Record Treating RN: Ahmed Prima [id number] Number: Other Clinician: Date of Birth/Sex: 1950-10-02 (65 y.o. Male) Treating [name], MICHAEL Primary Care Physician/Extender: Tonye Pearson Physician: Referring Physician: Doristine Locks in Treatment: 2 History of Present Illness Location: open wound just above his right ankle Quality: Patient reports experiencing a dull pain to affected area(s). Severity: Patient states wound are getting worse. Duration: Patient has had the wound for < 4 weeks prior to presenting for treatment Timing: Pain in wound is Intermittent (comes and goes Context: The wound appeared gradually over time Modifying Factors: Consults to this date include: he was seen in the ER and was referred to a vascular surgeon but the patient has not done that. He may have been treated with clindamycin in the ER. Associated Signs and Symptoms: Patient reports having difficulty standing for long periods. HPI Description: 65 year old gentleman who was seen in the emergency department recently on 01/06/2015 for a  wound of his right lower extremity which he says was not involving any injury and he did not know how he sustained it. He had draining foul-smelling liquid from the area and had gone for care there. his past medical history is significant for DVT, hypertension, gout, tobacco abuse, cocaine abuse, stroke, atrial fibrillation, pulmonary embolism. he has also had some vascular surgery with a stent placed in his leg. He has been a smoker for many years and has given up straight  drugs several years ago. He continues to smoke about 4-5 cigarettes a day. 02/03/2015 -- received a note from 05/14/2013 where Dr. Leotis Pain placed an inferior vena cava filter. The patient had a deep vein thrombosis while therapeutic on anticoagulation for previous DVT and a IVC filter was placed for this. 02/10/2015 -- he did have his vascular test done on Friday but we have no reports yet. 02/17/2015 -- notes were reviewed from the vascular office and the patient had a venous ultrasound done which revealed that he had no reflux in the greater saphenous vein or the short saphenous vein bilaterally. He did have subacute DVT in the common femoral vein and popliteal veins on the right and left side. The recommendation was to continue with Unna's boot therapy at the wound clinic and then to wear graduated compression stockings once the ulcers healed and later if he had continuous problems lymphedema pump would benefit him. 03/17/2015 -- we have applied for his insurance and aide regarding cellular tissue-based products and are still awaiting the final clearance. 03/24/2015 -- he has had Apligraf authorized for him but his wound is looking so good today that we may not use it. 03/31/2015 -- he has not yet received his compression stockings though we have called a couple of times and hopefully they should arrive this week. [name], [name] ([id number]) [name] 01/06/16; this is a patient we have previously cared for in  this clinic with wounds on his right medial ankle. I was not previously involved with his care. 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 He wears compression stockings. He had reflux studies done during his last stay in this clinic which did not show significant reflux in the greater or lesser saphenous veins bilaterally. His history is that he developed a open sore on the left medial malleolus one week ago. He was seen in his primary physician office and given a course of doxycycline which he still should be on. Previously seen vascular surgery who felt that he had some degree of lymphedema as well. He is not a diabetic 01/13/16 no major change 01/20/16; very small wound on the medial right ankle again covered with surface slough that doesn't seem to be spotting the [employer]) Signed: 01/21/2016 7:55:14 AM By: [name] MD Entered By: [name] on 01/20/2016 15:54:07 Malson, [name] ([id number]) -------------------------------------------------------------------------------- Physical Exam Details Ovens, Date of Service: 01/20/2016 3:45 PM Patient Name: [name] Patient Account Number: [id number] Medical Record Treating RN: Ahmed Prima [id number] Number: Other Clinician: Date of Birth/Sex: 11-01-1950 (65 y.o. Male) Treating Dellia Nims, MICHAEL Primary Care Physician/Extender: Tonye Pearson Physician: Referring Physician: Doristine Locks in Treatment: 2 Constitutional Sitting or standing Blood Pressure is within target range for patient.. Pulse regular and within target range for patient.[name] Respirations regular, non-labored and within target range.. Temperature is normal and within the target range for the patient.. Patient's appearance is neat and clean. Appears in no acute distress. Well nourished and well developed.. Notes Wound exam; small wound  with not much surface area but some depth. Again tightly adherent surface slough. Underwent debridement. Surrounding erythema is better related to venous inflammation. Electronic Signature(s) Signed: 01/21/2016 7:55:14 AM By: [name] MD Entered By: [name] on 01/20/2016 15:55:20 Fayad, [name] ([id number]) -------------------------------------------------------------------------------- Physician Orders Details Ki, Date of Service: 01/20/2016 3:45 PM Patient Name: [facility] Patient Account Number: [id number] Medical Record Treating  RN: Ahmed Prima [id number] Number: Other Clinician: Date of Birth/Sex: 1950-12-17 (65 y.o. Male) Treating [name], MICHAEL Primary Care Physician/Extender: Tonye Pearson Physician: Referring Physician: Doristine Locks in Treatment: 2 Verbal / Phone Orders: Yes Clinician: Pinkerton, Debi Read Back and Verified: Yes Diagnosis Coding Wound Cleansing Wound #2 Left,Medial Malleolus o Clean wound with Normal Saline. Anesthetic Wound #2 Left,Medial Malleolus o Topical Lidocaine 4% cream applied to wound bed prior to debridement Primary Wound Dressing Wound #2 Left,Medial Malleolus o Hydrafera Blue - transfer Secondary Dressing Wound #2 Left,Medial Malleolus o ABD pad Dressing Change Frequency Wound #2 Left,Medial Malleolus o Change dressing every week Follow-up Appointments Wound #2 Left,Medial Malleolus o Return Appointment in 1 week. Edema Control Wound #2 Left,Medial Malleolus o 4-Layer Compression System - Right Lower Extremity - unna to anchor o Elevate legs to the level of the heart and pump ankles as often as possible - and when sitting Additional Orders / Instructions Wound #2 Left,Medial Malleolus o Increase protein intake. XENG, KUCHER ([id number]) Electronic Signature(s) Signed: 01/20/2016 4:41:03 PM By: Alric Quan Signed: 01/21/2016 7:55:14 AM By: [name] MD Entered By:  Alric Quan on 01/20/2016 15:42:57 Boyack, [name] ([id number]) -------------------------------------------------------------------------------- Problem List Details Rohl, Date of Service: 01/20/2016 3:45 PM Patient Name: [facility] Patient Account Number: [id number] Medical Record Treating RN: Ahmed Prima [id number] Number: Other Clinician: Date of Birth/Sex: June 27, 1950 (65 y.o. 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/06/2016 Yes breakdown of skin I87.332 Chronic venous hypertension (idiopathic) with ulcer and 01/06/2016 Yes inflammation of left lower extremity Inactive Problems Resolved Problems Electronic Signature(s) Signed: 01/21/2016 7:55:14 AM By: [name] MD Entered By: [name] on 01/20/2016 15:52:55 Hornsby, [name] ([id number]) -------------------------------------------------------------------------------- Progress Note Details Brunson, Date of Service: 01/20/2016 3:45 PM Patient Name: [name] Patient Account Number: [id number] Medical Record Treating RN: Ahmed Prima [id number] Number: Other Clinician: Date of Birth/Sex: 1951-01-29 (65 y.o. Male) Treating Dellia Nims, MICHAEL Primary Care Physician/Extender: Tonye Pearson Physician: Referring Physician: Doristine Locks in Treatment: 2 Subjective Chief Complaint Information obtained from Patient Patient presents for treatment of an open ulcer due to venous insufficiency. He says he noticed a "into the right medial ankle for about 3-4 weeks 01/06/16; patient reaper sensitive today for review of a wound over his left medial ankle there is been present for one week History of Present Illness (HPI) The following HPI elements were documented  for the patient's wound: Location: open wound just above his right ankle Quality: Patient reports experiencing a dull pain to affected area(s). Severity: Patient states wound are getting worse. Duration: Patient has had the wound for < 4 weeks prior to presenting for treatment Timing: Pain in wound is Intermittent (comes and goes Context: The wound appeared gradually over time Modifying Factors: Consults to this date include: he was seen in the ER and was referred to a vascular surgeon but the patient has not done that. He may have been treated with clindamycin in the ER. Associated Signs and Symptoms: Patient reports having difficulty standing for long periods. 65 year old gentleman who was seen in the emergency department recently on 01/06/2015 for a wound of his right lower extremity which he says was not involving any injury and he did not know how he sustained it. He had draining foul-smelling liquid from the area and had gone for care there. his past medical  history is significant for DVT, hypertension, gout, tobacco abuse, cocaine abuse, stroke, atrial fibrillation, pulmonary embolism. he has also had some vascular surgery with a stent placed in his leg. He has been a smoker for many years and has given up straight drugs several years ago. He continues to smoke about 4-5 cigarettes a day. 02/03/2015 -- received a note from 05/14/2013 where Dr. Leotis Pain placed an inferior vena cava filter. The patient had a deep vein thrombosis while therapeutic on anticoagulation for previous DVT and a IVC filter was placed for this. 02/10/2015 -- he did have his vascular test done on Friday but we have no reports yet. 02/17/2015 -- notes were reviewed from the vascular office and the patient had a venous ultrasound done which revealed that he had no reflux in the greater saphenous vein or the short saphenous vein bilaterally. He did have subacute DVT in the common femoral vein and popliteal veins on  the right and left side. The recommendation was to continue with Unna's boot therapy at the wound clinic and then to wear graduated Rolon, Ledford ([id number]) compression stockings once the ulcers healed and later if he had continuous problems lymphedema pump would benefit him. 03/17/2015 -- we have applied for his insurance and aide regarding cellular tissue-based products and are still awaiting the final clearance. 03/24/2015 -- he has had Apligraf authorized for him but his wound is looking so good today that we may not use it. 03/31/2015 -- he has not yet received his compression stockings though we have called a couple of times and hopefully they should arrive this week. READMISSION 01/06/16; this is a patient we have previously cared for in this clinic with wounds on his right medial ankle. I was not previously involved with his care. 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 He wears compression stockings. He had reflux studies done during his last stay in this clinic which did not show significant reflux in the greater or lesser saphenous veins bilaterally. His history is that he developed a open sore on the left medial malleolus one week ago. He was seen in his primary physician office and given a course of doxycycline which he still should be on. Previously seen vascular surgery who felt that he had some degree of lymphedema as well. He is not a diabetic 01/13/16 no major change 01/20/16; very small wound on the medial right ankle again covered with surface slough that doesn't seem to be spotting the [employer] Objective Constitutional Sitting or standing Blood Pressure is within target range for patient.. Pulse regular and within target range for patient.[name] Respirations regular, non-labored and within target range.. Temperature is normal and within the target range for the patient..  Patient's appearance is neat and clean. Appears in no acute distress. Well nourished and well developed.. Vitals Time Taken: 3:20 PM, Height: 74 in, Weight: 240 lbs, BMI: 30.8, Temperature: 97.5 F, Pulse: 64 bpm, Respiratory Rate: 18 breaths/min, Blood Pressure: 138/80 mmHg. General Notes: Wound exam; small wound with not much surface area but some depth. Again tightly adherent surface slough. Underwent debridement. Surrounding erythema is better related to venous inflammation. Integumentary (Hair, Skin) Wound #2 status is Open. Original cause of wound was Trauma. The wound is located on the Left,Medial Malleolus. The wound measures 0.7cm length x 0.5cm width x 0.2cm depth; 0.275cm^2 area and 0.055cm^3 volume. The wound is limited  to skin breakdown. There is no tunneling or undermining noted. There is a medium amount of serous drainage noted. The wound margin is flat and intact. There is medium (34-66%) red, pink, pale granulation within the wound bed. There is a medium (34-66%) amount of necrotic Lindh, Akili ([id number]) tissue within the wound bed including Adherent Slough. The periwound skin appearance exhibited: Localized Edema, Moist, Hemosiderin Staining, Mottled. The periwound skin appearance did not exhibit: Callus, Crepitus, Excoriation, Fluctuance, Friable, Induration, Rash, Scarring, Dry/Scaly, Maceration, Atrophie Blanche, Cyanosis, Ecchymosis, Pallor, Rubor, Erythema. Periwound temperature was noted as No Abnormality. The periwound has tenderness on palpation. 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 There was a Skin/Subcutaneous Tissue Debridement ([id number]) debridement with total area of 0.35 sq cm  performed by Ricard Dillon, MD. with the following instrument(s): Curette to remove Viable and Non-Viable tissue/material including Exudate, Fibrin/Slough, and Subcutaneous after achieving pain control using Lidocaine 4% Topical Solution. A time out was conducted at 15:39, prior to the start of the procedure. A Minimum amount of bleeding was controlled with Pressure. The procedure was tolerated well with a pain level of 0 throughout and a pain level of 0 following the procedure. Post Debridement Measurements: 0.7cm length x 0.5cm width x 0.2cm depth; 0.055cm^3 volume. Character of Wound/Ulcer Post Debridement is stable. Severity of Tissue Post Debridement is: Fat layer exposed. Post procedure Diagnosis Wound #2: Same as Pre-Procedure Plan Wound Cleansing: Wound #2 Left,Medial Malleolus: Clean wound with Normal Saline. Anesthetic: Wound #2 Left,Medial Malleolus: Lartigue, Nation ([id number]) Topical Lidocaine 4% cream applied to wound bed prior to debridement Primary Wound Dressing: Wound #2 Left,Medial Malleolus: Hydrafera Blue - transfer Secondary Dressing: Wound #2 Left,Medial Malleolus: ABD pad Dressing Change Frequency: Wound #2 Left,Medial Malleolus: Change dressing every week Follow-up Appointments: Wound #2 Left,Medial Malleolus: Return Appointment in 1 week. Edema Control: Wound #2 Left,Medial Malleolus: 4-Layer Compression System - Right Lower Extremity - unna to anchor Elevate legs to the level of the heart and pump ankles as often as possible - and when sitting Additional Orders / Instructions: Wound #2 Left,Medial Malleolus: Increase protein intake. 1 changed dressing to hydrofera blue., consider changing to iodoflex if this is not successful Electronic Signature(s) Signed: 01/21/2016 7:55:14 AM By: [name] MD Entered By: [name] on 01/20/2016 15:57:04 Milbourn, [name]  ([id number]) -------------------------------------------------------------------------------- SuperBill Details Akkerman, Date of Service: 01/20/2016 Patient Name: [name] Patient Account Number: [id number] Medical Record Treating RN: Ahmed Prima [id number] Number: Other Clinician: Date of Birth/Sex: 1950/08/03 (65 y.o. Male) Treating [name], MICHAEL Primary Care Physician/Extender: Tonye Pearson Physician: Suella Grove in Treatment: 2 Referring Physician: Royetta Crochet Diagnosis Coding ICD-10 Codes Code Description V42.595 Non-pressure chronic ulcer of left ankle limited to breakdown of skin Chronic venous hypertension (idiopathic) with ulcer and inflammation of left lower I87.332 extremity Facility Procedures CPT4: Description Modifier Quantity Code [id number] - DEB SUBQ TISSUE 20 SQ CM/< 1 ICD-10 Description Diagnosis I87.332 Chronic venous hypertension (idiopathic) with ulcer and inflammation of left lower extremity Physician Procedures CPT4: Description Modifier Quantity Code [id number] - WC PHYS SUBQ TISS 20 SQ CM 1 ICD-10 Description Diagnosis I87.332 Chronic venous hypertension (idiopathic) with ulcer and inflammation of left lower extremity Electronic Signature(s) Signed: 01/21/2016 7:55:14 AM By: [name] MD Entered By: [name] on 01/20/2016 15:57:28

## 2016-01-21 NOTE — Progress Notes (Signed)
CLEVEN, JANSMA ([id number]) Visit Report for 01/20/2016 Arrival Information Details Lasser, Date of Service: 01/20/2016 3:45 PM Patient Name: [name] Patient Account Number: [id number] Medical Record Treating RN: Ahmed Prima [id number] Number: Other Clinician: Date of Birth/Sex: 1951/05/02 (65 y.o. Male) Treating Dellia Nims, MICHAEL Primary Care Physician/Extender: Tonye Pearson Physician: Referring Physician: Doristine Locks in Treatment: 2 Visit Information History Since Last Visit All ordered tests and consults were completed: No Patient Arrived: Ambulatory Added or deleted any medications: No Arrival Time: 15:18 Any new allergies or adverse reactions: No Accompanied By: self Had a fall or experienced change in No Transfer Assistance: None activities of daily living that may affect Patient Identification Verified: Yes risk of falls: Secondary Verification Process Yes Signs or symptoms of abuse/neglect since last No Completed: visito Patient Has Alerts: Yes Hospitalized since last visit: No Patient Alerts: Patient on Blood Pain Present Now: No Thinner warfarin Electronic Signature(s) Signed: 01/20/2016 4:41:03 PM By: Alric Quan Entered By: Alric Quan on 01/20/2016 15:18:39 Eimer, [name] ([id number]) -------------------------------------------------------------------------------- Encounter Discharge Information Details Karabin, Date of Service: 01/20/2016 3:45 PM Patient Name: [name] Patient Account Number: [id number] Medical Record Treating RN: Ahmed Prima [id number] Number: Other Clinician: Date of Birth/Sex: 1951-05-06 (65 y.o. Male) Treating [name], MICHAEL Primary Care Physician/Extender: Tonye Pearson Physician: Referring Physician: Doristine Locks in Treatment: 2 Encounter Discharge Information Items Discharge Pain Level: 0 Discharge Condition: Stable Ambulatory Status: Ambulatory Discharge Destination:  Home Transportation: Private Auto Accompanied By: self Schedule Follow-up Appointment: Yes Medication Reconciliation completed and provided to Patient/Care Yes Ziya Coonrod: Provided on Clinical Summary of Care: 01/20/2016 Form Type Recipient Paper Patient WD Electronic Signature(s) Signed: 01/20/2016 3:57:30 PM By: Ruthine Dose Entered By: Ruthine Dose on 01/20/2016 15:57:29 Laday, [name] ([id number]) -------------------------------------------------------------------------------- Lower Extremity Assessment Details Puerta, Date of Service: 01/20/2016 3:45 PM Patient Name: [name] Patient Account Number: [id number] Medical Record Treating RN: Ahmed Prima [id number] Number: Other Clinician: Date of Birth/Sex: Feb 28, 1951 (65 y.o. Male) Treating [name], MICHAEL Primary Care Physician/Extender: Tonye Pearson Physician: Referring Physician: Doristine Locks in Treatment: 2 Edema Assessment Assessed: [Left: No] [Right: No] E[Left: dema] [Right: :] Calf Left: Right: Point of Measurement: 40 cm From Medial Instep 43.8 cm cm Ankle Left: Right: Point of Measurement: 13 cm From Medial Instep 27.8 cm cm Vascular Assessment Pulses: Posterior Tibial Dorsalis Pedis Palpable: [Left:Yes] Extremity colors, hair growth, and conditions: Extremity Color: [Left:Normal] Temperature of Extremity: [Left:Warm] Capillary Refill: [Left:< 3 seconds] Toe Nail Assessment Left: Right: Thick: Yes Discolored: Yes Deformed: No Improper Length and Hygiene: No Electronic Signature(s) Signed: 01/20/2016 4:41:03 PM By: Alric Quan Entered By: Alric Quan on 01/20/2016 15:27:06 Lockamy, [name] ([id number]) Ozaki, [name] ([id number]) -------------------------------------------------------------------------------- Multi Wound Chart Details Gaunt, Date of Service: 01/20/2016 3:45 PM Patient Name: [name] Patient Account Number: [id number] Medical Record Treating RN:  Ahmed Prima [id number] Number: Other Clinician: Date of Birth/Sex: [birth year] (65 y.o. Male) Treating [name], MICHAEL Primary Care Physician/Extender: Tonye Pearson Physician: Referring Physician: Doristine Locks in Treatment: 2 Vital Signs Height(in): 74 Pulse(bpm): 64 Weight(lbs): 240 Blood Pressure 138/80 (mmHg): Body Mass Index(BMI): 31 Temperature(F): 97.5 Respiratory Rate 18 (breaths/min): Photos: [2:No Photos] [N/A:N/A] Wound Location: [2:Left Malleolus - Medial] [N/A:N/A] Wounding Event: [2:Trauma] [N/A:N/A] Primary Etiology: [2:Venous Leg Ulcer] [N/A:N/A] Comorbid History: [2:Arrhythmia, Hypertension, N/A Gout, Neuropathy] Date Acquired: [2:12/29/2015] [N/A:N/A] Weeks of Treatment: [2:2] [N/A:N/A] Wound Status: [2:Open] [N/A:N/A] Measurements L x W x D 0.7x0.5x0.2 [N/A:N/A] (cm) Area (cm) : [2:0.275] [N/A:N/A] Volume (cm) : [2:0.055] [N/A:N/A] % Reduction in Area: [2:22.10%] [N/A:N/A] % Reduction in  Volume: 22.50% [N/A:N/A] Classification: [2:Full Thickness Without Exposed Support Structures] [N/A:N/A] Exudate Amount: [2:Medium] [N/A:N/A] Exudate Type: [2:Serous] [N/A:N/A] Exudate Color: [2:amber] [N/A:N/A] Wound Margin: [2:Flat and Intact] [N/A:N/A] Granulation Amount: [2:Medium (34-66%)] [N/A:N/A] Granulation Quality: [2:Red, Pink, Pale] [N/A:N/A] Necrotic Amount: [2:Medium (34-66%)] [N/A:N/A] Exposed Structures: [2:Fascia: No Fat: No] [N/A:N/A] Tendon: No Muscle: No Joint: No Bone: No Limited to Skin Breakdown Epithelialization: Small (1-33%) N/A N/A Periwound Skin Texture: Edema: Yes N/A N/A Excoriation: No Induration: No Callus: No Crepitus: No Fluctuance: No Friable: No Rash: No Scarring: No Periwound Skin Moist: Yes N/A N/A Moisture: Maceration: No Dry/Scaly: No Periwound Skin Color: Hemosiderin Staining: Yes N/A N/A Mottled: Yes Atrophie Blanche: No Cyanosis: No Ecchymosis: No Erythema: No Pallor: No Rubor:  No Temperature: No Abnormality N/A N/A Tenderness on Yes N/A N/A Palpation: Wound Preparation: Ulcer Cleansing: N/A N/A Rinsed/Irrigated with Saline, Other: surg scrub and water Topical Anesthetic Applied: Other: lidocaine 4% Treatment Notes Electronic Signature(s) Signed: 01/20/2016 4:41:03 PM By: Alric Quan Entered By: Alric Quan on 01/20/2016 15:36:18 Perkin, [name] ([id number]) -------------------------------------------------------------------------------- Multi-Disciplinary Care Plan Details Roehr, Date of Service: 01/20/2016 3:45 PM Patient Name: [name] Patient Account Number: [id number] Medical Record Treating RN: Ahmed Prima [id number] Number: Other Clinician: Date of Birth/Sex: [birth year] (65 y.o. Male) Treating [name], MICHAEL Primary Care Physician/Extender: Tonye Pearson Physician: Referring Physician: Doristine Locks in Treatment: 2 Active Inactive Orientation to the Wound Care Program Nursing Diagnoses: Knowledge deficit related to the wound healing center program Goals: Patient/caregiver will verbalize understanding of the [facility] Program Date Initiated: 01/06/2016 Goal Status: Active Interventions: Provide education on orientation to the wound center Notes: Venous Leg Ulcer Nursing Diagnoses: Actual venous Insuffiency (use after diagnosis is confirmed) Goals: Patient will maintain optimal edema control Date Initiated: 01/06/2016 Goal Status: Active Interventions: Compression as ordered Notes: Wound/Skin Impairment Nursing Diagnoses: Knowledge deficit related to ulceration/compromised skin integrity Hebard, [name] ([id number]) Goals: Patient/caregiver will verbalize understanding of skin care regimen Date Initiated: 01/06/2016 Goal Status: Active Ulcer/skin breakdown will have a volume reduction of 30% by week 4 Date Initiated: 01/06/2016 Goal Status: Active Ulcer/skin breakdown will have a volume  reduction of 50% by week 8 Date Initiated: 01/06/2016 Goal Status: Active Ulcer/skin breakdown will have a volume reduction of 80% by week 12 Date Initiated: 01/06/2016 Goal Status: Active Ulcer/skin breakdown will heal within 14 weeks Date Initiated: 01/06/2016 Goal Status: Active Interventions: Assess patient/caregiver ability to obtain necessary supplies Assess patient/caregiver ability to perform ulcer/skin care regimen upon admission and as needed Assess ulceration(s) every visit Notes: Electronic Signature(s) Signed: 01/20/2016 4:41:03 PM By: Alric Quan Entered By: Alric Quan on 01/20/2016 15:36:10 Brands, [name] ([id number]) -------------------------------------------------------------------------------- Pain Assessment Details Luchsinger, Date of Service: 01/20/2016 3:45 PM Patient Name: [name] Patient Account Number: [id number] Medical Record Treating RN: Ahmed Prima [id number] Number: Other Clinician: Date of Birth/Sex: 09-14-1950 (65 y.o. Male) Treating Dellia Nims, MICHAEL Primary Care Physician/Extender: Tonye Pearson Physician: Referring Physician: Doristine Locks in Treatment: 2 Active Problems Location of Pain Severity and Description of Pain Patient Has Paino Yes Site Locations Pain Location: Pain in Ulcers With Dressing Change: Yes Duration of the Pain. Constant / Intermittento Constant Rate the pain. Current Pain Level: 5 Worst Pain Level: 8 Least Pain Level: 2 Character of Pain Describe the Pain: Aching Pain Management and Medication Current Pain Management: Electronic Signature(s) Signed: 01/20/2016 4:41:03 PM By: Alric Quan Entered By: Alric Quan on 01/20/2016 15:19:00 Sotero, [name] ([id number]) -------------------------------------------------------------------------------- Patient/Caregiver Education Details Ismael, Date of Service: 01/20/2016 3:45 PM Patient Name: [name]  Patient Account Number:  [id number] Medical Record Treating RN: Ahmed Prima [id number] Number: Other Clinician: Date of Birth/Gender: Oct 29, 1950 (65 y.o. Male) Treating [name], MICHAEL Primary Care Physician/Extender: Tonye Pearson Physician: Suella Grove in Treatment: 2 Referring Physician: Royetta Crochet Education Assessment Education Provided To: Patient Education Topics Provided Wound/Skin Impairment: Handouts: Other: change dressing as ordered and do not get dressings wet Methods: Demonstration, Explain/Verbal Responses: State content correctly Electronic Signature(s) Signed: 01/20/2016 4:41:03 PM By: Alric Quan Entered By: Alric Quan on 01/20/2016 15:56:28 Meisenheimer, [name] ([id number]) -------------------------------------------------------------------------------- Wound Assessment Details Rudman, Date of Service: 01/20/2016 3:45 PM Patient Name: [name] Patient Account Number: [id number] Medical Record Treating RN: Ahmed Prima [id number] Number: Other Clinician: Date of Birth/Sex: 1951-02-14 (65 y.o. Male) Treating [name], MICHAEL Primary Care Physician/Extender: Tonye Pearson Physician: Referring Physician: Doristine Locks in Treatment: 2 Wound Status Wound Number: 2 Primary Venous Leg Ulcer Etiology: Wound Location: Left Malleolus - Medial Wound Status: Open Wounding Event: Trauma Comorbid Arrhythmia, Hypertension, Gout, Date Acquired: 12/29/2015 History: Neuropathy Weeks Of Treatment: 2 Clustered Wound: No Photos Photo Uploaded By: Alric Quan on 01/20/2016 16:14:02 Wound Measurements Length: (cm) 0.7 Width: (cm) 0.5 Depth: (cm) 0.2 Area: (cm) 0.275 Volume: (cm) 0.055 % Reduction in Area: 22.1% % Reduction in Volume: 22.5% Epithelialization: Small (1-33%) Tunneling: No Undermining: No Wound Description Full Thickness Without Exposed Foul Odor Afte Classification: Support Structures Wound Margin: Flat and  Intact Exudate Medium Amount: Exudate Type: Serous Exudate Color: amber r Cleansing: No Wound Bed Bernards, Archer ([id number]) Granulation Amount: Medium (34-66%) Exposed Structure Granulation Quality: Red, Pink, Pale Fascia Exposed: No Necrotic Amount: Medium (34-66%) Fat Layer Exposed: No Necrotic Quality: Adherent Slough Tendon Exposed: No Muscle Exposed: No Joint Exposed: No Bone Exposed: No Limited to Skin Breakdown Periwound Skin Texture Texture Color No Abnormalities Noted: No No Abnormalities Noted: No Callus: No Atrophie Blanche: No Crepitus: No Cyanosis: No Excoriation: No Ecchymosis: No Fluctuance: No Erythema: No Friable: No Hemosiderin Staining: Yes Induration: No Mottled: Yes Localized Edema: Yes Pallor: No Rash: No Rubor: No Scarring: No Temperature / Pain Moisture Temperature: No Abnormality No Abnormalities Noted: No Tenderness on Palpation: Yes Dry / Scaly: No Maceration: No Moist: Yes Wound Preparation Ulcer Cleansing: Rinsed/Irrigated with Saline, Other: surg scrub and water, Topical Anesthetic Applied: Other: lidocaine 4%, Treatment Notes Wound #2 (Left, Medial Malleolus) 1. Cleansed with: Cleanse wound with antibacterial soap and water 2. Anesthetic Topical Lidocaine 4% cream to wound bed prior to debridement 4. Dressing Applied: Other dressing (specify in notes) 5. Secondary Dressing Applied ABD Pad 7. Secured with Tape 4-Layer Compression System - Left Lower Extremity Notes unna to anchor, hydrafera blue transfer Pence, Jakorey ([id number]) Electronic Signature(s) Signed: 01/20/2016 4:41:03 PM By: Alric Quan Entered By: Alric Quan on 01/20/2016 15:29:23 Ducey, [name] ([id number]) -------------------------------------------------------------------------------- Vitals Details Brazie, Date of Service: 01/20/2016 3:45 PM Patient Name: [name] Patient Account Number: [id number] Medical Record  Treating RN: Ahmed Prima [id number] Number: Other Clinician: Date of Birth/Sex: [birth year] (65 y.o. Male) Treating [name], MICHAEL Primary Care Physician/Extender: Tonye Pearson Physician: Referring Physician: Doristine Locks in Treatment: 2 Vital Signs Time Taken: 15:20 Temperature (F): 97.5 Height (in): 74 Pulse (bpm): 64 Weight (lbs): 240 Respiratory Rate (breaths/min): 18 Body Mass Index (BMI): 30.8 Blood Pressure (mmHg): 138/80 Reference Range: 80 - 120 mg / dl Electronic Signature(s) Signed: 01/20/2016 4:41:03 PM By: Alric Quan Entered By: Alric Quan on 01/20/2016 15:20:54

## 2016-01-27 ENCOUNTER — Encounter: Payer: Medicare Other | Admitting: Internal Medicine

## 2016-01-27 ENCOUNTER — Other Ambulatory Visit
Admission: RE | Admit: 2016-01-27 | Discharge: 2016-01-27 | Disposition: A | Discharge status: Home / Self Care | Payer: Medicare Other | Source: Ambulatory Visit | Attending: Internal Medicine | Admitting: Internal Medicine

## 2016-01-27 DIAGNOSIS — I87332 Chronic venous hypertension (idiopathic) with ulcer and inflammation of left lower extremity: Secondary | ICD-10-CM | POA: Diagnosis not present

## 2016-01-27 DIAGNOSIS — L089 Local infection of the skin and subcutaneous tissue, unspecified: Secondary | ICD-10-CM | POA: Diagnosis present

## 2016-01-27 NOTE — Progress Notes (Signed)
ADONTE, VANRIPER ([id number]) Visit Report for 01/27/2016 Chief Complaint Document Details [name], Date of Service: 01/27/2016 1:30 PM Patient Name: [name] Patient Account Number: [id number] Medical Record Treating RN: [name] [id number] Number: Other Clinician: Date of Birth/Sex: 05-Oct-1950 (65 y.o. Male) Treating [name] Primary Care Physician/Extender: [name] Physician: Referring Physician: Doristine [name] in Treatment: 3 Information Obtained from: Patient Chief Complaint Patient presents for treatment of an open ulcer due to venous insufficiency. He says he noticed a "into the right medial ankle for about 3-4 weeks 01/06/16; patient reaper sensitive today for review of a wound over his left medial ankle there is been present for one week Electronic Signature(s) Signed: 01/27/2016 4:19:04 PM By: Mark Ham MD Entered By: [name] on 01/27/2016 13:52:21 [name] ([id number]) -------------------------------------------------------------------------------- HPI Details [name], Date of Service: 01/27/2016 1:30 PM Patient Name: [name] Patient Account Number: [id number] Medical Record Treating RN: [name] [id number] Number: Other Clinician: Date of Birth/Sex: [birth year] (65 y.o. Male) Treating [name] Primary Care Physician/Extender: [name] Physician: Referring Physician: Doristine [name] in Treatment: 3 History of Present Illness Location: open wound just above his right ankle Quality: Patient reports experiencing a dull [name] to affected area(s). Severity: Patient states wound are getting worse. Duration: Patient has had the wound for < 4 weeks prior to presenting for treatment Timing: [name] in wound is Intermittent (comes and goes Context: The wound appeared gradually over time Modifying Factors: Consults to this date include: he was seen in the ER and was referred to a vascular surgeon but the  patient has not done that. He may have been treated with clindamycin in the ER. Associated Signs and Symptoms: Patient reports having difficulty standing for long periods. HPI Description: 65 year old gentleman who was seen in the emergency department recently on 01/06/2015 for a wound of his right lower extremity which he says was not involving any injury and he did not know how he sustained it. He had draining foul-smelling liquid from the area and had gone for care there. his past medical history is significant for DVT, hypertension, gout, tobacco abuse, cocaine abuse, stroke, atrial fibrillation, pulmonary embolism. he has also had some vascular surgery with a stent placed in his leg. He has been a smoker for many years and has given up straight drugs several years ago. He continues to smoke about 4-5 cigarettes a day. 02/03/2015 -- received a note from 05/14/2013 where Dr. Leotis [name] placed an inferior vena cava filter. The patient had a deep vein thrombosis while therapeutic on anticoagulation for previous DVT and a IVC filter was placed for this. 02/10/2015 -- he did have his vascular test done on Friday but we have no reports yet. 02/17/2015 -- notes were reviewed from the vascular office and the patient had a venous ultrasound done which revealed that he had no reflux in the greater saphenous vein or the short saphenous vein bilaterally. He did have subacute DVT in the common femoral vein and popliteal veins on the right and left side. The recommendation was to continue with Unna's boot therapy at the wound clinic and then to wear graduated compression stockings once the ulcers healed and later if he had continuous problems lymphedema pump would benefit him. 03/17/2015 -- we have applied for his insurance and aide regarding cellular tissue-based products and are still awaiting the final clearance. 03/24/2015 -- he has had Apligraf authorized for him but his wound is looking so good  today that we may not use it. 03/31/2015 --  he has not yet received his compression stockings though we have called a couple of times and hopefully they should arrive this week. [name] ([id number]) [name] 01/06/16; this is a patient we have previously cared for in this clinic with wounds on his right medial ankle. I was not previously involved with his care. 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 He wears compression stockings. He had reflux studies done during his last stay in this clinic which did not show significant reflux in the greater or lesser saphenous veins bilaterally. His history is that he developed a open sore on the left medial malleolus one week ago. He was seen in his primary physician office and given a course of doxycycline which he still should be on. Previously seen vascular surgery who felt that he had some degree of lymphedema as well. He is not a diabetic 01/13/16 no major change 01/20/16; very small wound on the medial right ankle again covered with surface slough that doesn't seem to be spotting the Prisma 01/27/16; patient comes in today complaining of a lot of [name] around the wound site. He has not been systemically unwell. Electronic Signature(s) Signed: 01/27/2016 4:19:04 PM By: Mark Ham MD Entered By: [name] on 01/27/2016 13:54:04 [name] -------------------------------------------------------------------------------- Physical Exam Details [name], Date of Service: 01/27/2016 1:30 PM Patient Name: [name] Patient Account Number: [id number] Medical Record Treating RN: [name] [id number] Number: Other Clinician: Date of Birth/Sex: 08-16-1950 (65 y.o. Male) Treating [name] Primary Care Physician/Extender: [name] Physician: Referring Physician: Doristine [name] in Treatment:  3 Constitutional Sitting or standing Blood Pressure is within target range for patient.. Pulse regular and within target range for patient.[name] Respirations regular, non-labored and within target range.. Temperature is normal and within the target range for the patient.. Patient does not appear unwell. Cardiovascular Pedal pulses palpable and strong bilaterally.. Edema present in both extremities. This is mild. Chronic venous insufficiency. Notes Wound exam; small wound today with purulent drainage. This was cultured. There appeared to be surrounding induration and tenderness the areawhich was marked Electronic Signature(s) Signed: 01/27/2016 4:19:04 PM By: Mark Ham MD Entered By: [name] on 01/27/2016 13:56:41 [place], [name] ([id number]) -------------------------------------------------------------------------------- Physician Orders Details [place], Date of Service: 01/27/2016 1:30 PM Patient Name: [name] Patient Account Number: [id number] Medical Record Treating RN: [name] [id number] Number: Other Clinician: Date of Birth/Sex: 1950/12/27 (65 y.o. Male) Treating [name] Primary Care Physician/Extender: [name] Physician: Referring Physician: Doristine [name] in Treatment: 3 Verbal / Phone Orders: Yes Clinician: Cornell [name] Read Back and Verified: Yes Diagnosis Coding ICD-10 Coding Code Description P59.163 Non-pressure chronic ulcer of left ankle limited to breakdown of skin Chronic venous hypertension (idiopathic) with ulcer and inflammation of left lower I87.332 extremity Wound Cleansing Wound #2 Left,Medial Malleolus o Clean wound with wound cleanser. Anesthetic Wound #2 Left,Medial Malleolus o Topical Lidocaine 4% cream applied to wound bed prior to debridement Skin Barriers/Peri-Wound Care Wound #2 Left,Medial Malleolus o Skin Prep Primary Wound Dressing Wound #2 Left,Medial Malleolus o Aquacel Ag Secondary  Dressing Wound #2 Left,Medial Malleolus o Boardered Foam Dressing Dressing Change Frequency Wound #2 Left,Medial Malleolus o Change Dressing Monday, Wednesday, Friday Follow-up Appointments Wound #2 Left,Medial Malleolus Curtner, [name] ([id number]) o Return Appointment in 1 week. Edema Control Wound #2 Left,Medial Malleolus o Elevate legs to the level of the heart and pump ankles  as often as possible Medications-please add to medication list. Wound #2 Left,Medial Malleolus o P.O. Antibiotics Laboratory o Bacteria identified in Wound by Culture (MICRO) - left medial malleolus oooo LOINC Code: 8563-1 oooo Convenience Name: Wound culture routine Electronic Signature(s) Signed: 01/27/2016 4:19:04 PM By: Mark Ham MD Signed: 01/27/2016 5:05:22 PM By: Gretta Cool RN, [name], [name], [name] Entered By: Gretta Cool, RN, [name], Kim on 01/27/2016 13:59:26 Wich, [name] ([id number]) -------------------------------------------------------------------------------- Problem List Details [place], Date of Service: 01/27/2016 1:30 PM Patient Name: [facility] Patient Account Number: [id number] Medical Record Treating RN: [name] [id number] Number: Other Clinician: Date of Birth/Sex: [birth year] (65 y.o. Male) Treating [name] Primary Care Physician/Extender: [name] Physician: Referring Physician: Doristine [name] in Treatment: 3 Active Problems ICD-10 Encounter Code Description Active Date Diagnosis L97.321 Non-pressure chronic ulcer of left ankle limited to 01/06/2016 Yes breakdown of skin I87.332 Chronic venous hypertension (idiopathic) with ulcer and 01/06/2016 Yes inflammation of left lower extremity Inactive Problems Resolved Problems Electronic Signature(s) Signed: 01/27/2016 4:19:04 PM By: Mark Ham MD Entered By: [name] on 01/27/2016 13:51:53 Schrodt, [name]  ([id number]) -------------------------------------------------------------------------------- Progress Note Details [place], Date of Service: 01/27/2016 1:30 PM Patient Name: [name] Patient Account Number: [id number] Medical Record Treating RN: [name] [id number] Number: Other Clinician: Date of Birth/Sex: [birth year] (65 y.o. Male) Treating [name] Primary Care Physician/Extender: [name] Physician: Referring Physician: Doristine [name] in Treatment: 3 Subjective Chief Complaint Information obtained from Patient Patient presents for treatment of an open ulcer due to venous insufficiency. He says he noticed a "into the right medial ankle for about 3-4 weeks 01/06/16; patient reaper sensitive today for review of a wound over his left medial ankle there is been present for one week History of Present Illness (HPI) The following HPI elements were documented for the patient's wound: Location: open wound just above his right ankle Quality: Patient reports experiencing a dull [name] to affected area(s). Severity: Patient states wound are getting worse. Duration: Patient has had the wound for < 4 weeks prior to presenting for treatment Timing: [name] in wound is Intermittent (comes and goes Context: The wound appeared gradually over time Modifying Factors: Consults to this date include: he was seen in the ER and was referred to a vascular surgeon but the patient has not done that. He may have been treated with clindamycin in the ER. Associated Signs and Symptoms: Patient reports having difficulty standing for long periods. 65 year old gentleman who was seen in the emergency department recently on 01/06/2015 for a wound of his right lower extremity which he says was not involving any injury and he did not know how he sustained it. He had draining foul-smelling liquid from the area and had gone for care there. his past medical history is significant for DVT, hypertension,  gout, tobacco abuse, cocaine abuse, stroke, atrial fibrillation, pulmonary embolism. he has also had some vascular surgery with a stent placed in his leg. He has been a smoker for many years and has given up straight drugs several years ago. He continues to smoke about 4-5 cigarettes a day. 02/03/2015 -- received a note from 05/14/2013 where Dr. Leotis [name] placed an inferior vena cava filter. The patient had a deep vein thrombosis while therapeutic on anticoagulation for previous DVT and a IVC filter was placed for this. 02/10/2015 -- he did have his vascular test done on Friday but we have no reports yet. 02/17/2015 -- notes were reviewed from the vascular office and the patient had a venous ultrasound done  which revealed that he had no reflux in the greater saphenous vein or the short saphenous vein bilaterally. He did have subacute DVT in the common femoral vein and popliteal veins on the right and left side. The recommendation was to continue with Unna's boot therapy at the wound clinic and then to wear graduated Meddaugh, Camron ([id number]) compression stockings once the ulcers healed and later if he had continuous problems lymphedema pump would benefit him. 03/17/2015 -- we have applied for his insurance and aide regarding cellular tissue-based products and are still awaiting the final clearance. 03/24/2015 -- he has had Apligraf authorized for him but his wound is looking so good today that we may not use it. 03/31/2015 -- he has not yet received his compression stockings though we have called a couple of times and hopefully they should arrive this week. READMISSION 01/06/16; this is a patient we have previously cared for in this clinic with wounds on his right medial ankle. I was not previously involved with his care. 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 He wears compression  stockings. He had reflux studies done during his last stay in this clinic which did not show significant reflux in the greater or lesser saphenous veins bilaterally. His history is that he developed a open sore on the left medial malleolus one week ago. He was seen in his primary physician office and given a course of doxycycline which he still should be on. Previously seen vascular surgery who felt that he had some degree of lymphedema as well. He is not a diabetic 01/13/16 no major change 01/20/16; very small wound on the medial right ankle again covered with surface slough that doesn't seem to be spotting the Prisma 01/27/16; patient comes in today complaining of a lot of [name] around the wound site. He has not been systemically unwell. Objective Constitutional Sitting or standing Blood Pressure is within target range for patient.. Pulse regular and within target range for patient.[name] Respirations regular, non-labored and within target range.. Temperature is normal and within the target range for the patient.. Patient does not appear unwell. Vitals Time Taken: 1:32 PM, Height: 74 in, Weight: 240 lbs, BMI: 30.8, Temperature: 97.5 F, Pulse: 73 bpm, Respiratory Rate: 18 breaths/min, Blood Pressure: 144/89 mmHg. Cardiovascular Pedal pulses palpable and strong bilaterally.. Edema present in both extremities. This is mild. Chronic venous insufficiency. General Notes: Wound exam; small wound today with purulent drainage. This was cultured. There appeared to be surrounding induration and tenderness the areawhich was marked Integumentary (Hair, Skin) Wound #2 status is Open. Original cause of wound was Trauma. The wound is located on the [place], [place] ([id number]) Malleolus. The wound measures 0.6cm length x 0.5cm width x 0.5cm depth; 0.236cm^2 area and 0.118cm^3 volume. The wound is limited to skin breakdown. There is a medium amount of serous drainage noted. The wound margin is  flat and intact. There is medium (34-66%) red, pink, pale granulation within the wound bed. There is a medium (34-66%) amount of necrotic tissue within the wound bed including Adherent Slough. The periwound skin appearance exhibited: Localized Edema, Moist, Hemosiderin Staining, Mottled. The periwound skin appearance did not exhibit: Callus, Crepitus, Excoriation, Fluctuance, Friable, Induration, Rash, Scarring, Dry/Scaly, Maceration, Atrophie Blanche, Cyanosis, Ecchymosis, Pallor, Rubor, Erythema. Periwound temperature was noted as No Abnormality. The periwound has tenderness on palpation. 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 I87.332 - Chronic venous hypertension (idiopathic) with ulcer and inflammation of left lower extremity Plan Changed the dressing to siver alginate In view of the infection around the wound, no wrap, border foam Electronic Signature(s) Signed: 01/27/2016 4:19:04 PM By: Mark Ham MD Entered By: [name] on 01/27/2016 13:57:46 Erekson, [name] ([id number]) -------------------------------------------------------------------------------- SuperBill Details [place], Date of Service: 01/27/2016 Patient Name: [name] Patient Account Number: [id number] Medical Record Treating RN: [name] [id number] Number: Other Clinician: Date of Birth/Sex: Jan 26, 1951 (65 y.o. Male) Treating [name] Primary Care Physician/Extender: [name] Physician: Suella [place] in Treatment: 3 Referring Physician: Royetta Crochet Diagnosis Coding ICD-10 Codes Code Description M93.112 Non-pressure chronic ulcer of left ankle limited to breakdown of skin Chronic venous hypertension (idiopathic) with ulcer and inflammation of left lower I87.332 extremity Facility Procedures CPT4 Code: [id number] Description: 07225 - WOUND CARE VISIT-LEV 3 EST PT Modifier: Quantity: 1 Physician Procedures CPT4  Code Description: [id number] - WC PHYS LEVEL 2 - EST PT ICD-10 Description Diagnosis L97.321 Non-pressure chronic ulcer of left ankle limited to Modifier: breakdown of Quantity: 1 skin Electronic Signature(s) Signed: 01/27/2016 4:19:04 PM By: Mark Ham MD Signed: 01/27/2016 5:05:22 PM By: Gretta Cool RN, [name], [name], [name] Entered By: Gretta Cool, RN, [name], Kim on 01/27/2016 15:49:57

## 2016-01-27 NOTE — Progress Notes (Signed)
[name], [name] ([id number]) Visit Report for 01/27/2016 Arrival Information Details [place], Date of Service: 01/27/2016 1:30 PM Patient Name: [name] Patient Account Number: [id number] Medical Record Treating RN: Cornell Barman [id number] Number: Other Clinician: Date of Birth/Sex: 06/12/1950 (65 y.o. Male) Treating Dellia Nims, MICHAEL Primary Care Physician/Extender: Tonye Pearson Physician: Referring Physician: Doristine Locks in Treatment: 3 Visit Information History Since Last Visit Added or deleted any medications: No Patient Arrived: Ambulatory Any new allergies or adverse reactions: No Arrival Time: 13:31 Had a fall or experienced change in No Accompanied By: self activities of daily living that may affect Transfer Assistance: None risk of falls: Patient Identification Verified: Yes Signs or symptoms of abuse/neglect since last No Secondary Verification Process Yes visito Completed: Pain Present Now: Yes Patient Has Alerts: Yes Patient Alerts: Patient on Blood Thinner warfarin Electronic Signature(s) Signed: 01/27/2016 5:05:22 PM By: Gretta Cool, RN, [name], [name], [name] Entered By: Gretta Cool, RN, [name], Kim on 01/27/2016 13:31:45 [name], [name] ([id number]) -------------------------------------------------------------------------------- Clinic Level of Care Assessment Details [place], Date of Service: 01/27/2016 1:30 PM Patient Name: [name] Patient Account Number: [id number] Medical Record Treating RN: Cornell Barman [id number] Number: Other Clinician: Date of Birth/Sex: 1950/07/03 (65 y.o. Male) Treating [name], MICHAEL Primary Care Physician/Extender: Tonye Pearson Physician: Referring Physician: Doristine Locks in Treatment: 3 Clinic Level of Care Assessment Items TOOL 4 Quantity Score '[]'$  - Use when only an EandM is performed on FOLLOW-UP visit 0 ASSESSMENTS - Nursing Assessment / Reassessment '[]'$  - Reassessment of Co-morbidities (includes updates in  patient status) 0 X - Reassessment of Adherence to Treatment Plan 1 5 ASSESSMENTS - Wound and Skin Assessment / Reassessment X - Simple Wound Assessment / Reassessment - one wound 1 5 '[]'$  - Complex Wound Assessment / Reassessment - multiple wounds 0 '[]'$  - Dermatologic / Skin Assessment (not related to wound area) 0 ASSESSMENTS - Focused Assessment '[]'$  - Circumferential Edema Measurements - multi extremities 0 '[]'$  - Nutritional Assessment / Counseling / Intervention 0 '[]'$  - Lower Extremity Assessment (monofilament, tuning fork, pulses) 0 '[]'$  - Peripheral Arterial Disease Assessment (using hand held doppler) 0 ASSESSMENTS - Ostomy and/or Continence Assessment and Care '[]'$  - Incontinence Assessment and Management 0 '[]'$  - Ostomy Care Assessment and Management (repouching, etc.) 0 PROCESS - Coordination of Care X - Simple Patient / Family Education for ongoing care 1 15 '[]'$  - Complex (extensive) Patient / Family Education for ongoing care 0 X - Staff obtains Consents, Records, Test Results / Process Orders 1 10 [name], [name] ([id number]) '[]'$  - Staff telephones HHA, Nursing Homes / Clarify orders / etc 0 '[]'$  - Routine Transfer to another Facility (non-emergent condition) 0 '[]'$  - Routine Hospital Admission (non-emergent condition) 0 '[]'$  - New Admissions / Biomedical engineer / Ordering NPWT, Apligraf, etc. 0 '[]'$  - Emergency Hospital Admission (emergent condition) 0 '[]'$  - Simple Discharge Coordination 0 X - Complex (extensive) Discharge Coordination 1 15 PROCESS - Special Needs '[]'$  - Pediatric / Minor Patient Management 0 '[]'$  - Isolation Patient Management 0 '[]'$  - Hearing / Language / Visual special needs 0 '[]'$  - Assessment of Community assistance (transportation, D/C planning, etc.) 0 '[]'$  - Additional assistance / Altered mentation 0 '[]'$  - Support Surface(s) Assessment (bed, cushion, seat, etc.) 0 INTERVENTIONS - Wound Cleansing / Measurement X - Simple Wound Cleansing - one wound 1 5 '[]'$  - Complex  Wound Cleansing - multiple wounds 0 X - Wound Imaging (photographs - any number of wounds) 1 5 '[]'$  - Wound Tracing (instead of photographs) 0 X -  Simple Wound Measurement - one wound 1 5 '[]'$  - Complex Wound Measurement - multiple wounds 0 INTERVENTIONS - Wound Dressings X - Small Wound Dressing one or multiple wounds 1 10 '[]'$  - Medium Wound Dressing one or multiple wounds 0 '[]'$  - Large Wound Dressing one or multiple wounds 0 '[]'$  - Application of Medications - topical 0 '[]'$  - Application of Medications - injection 0 Kopke, Artur ([id number]) INTERVENTIONS - Miscellaneous '[]'$  - External ear exam 0 X - Specimen Collection (cultures, biopsies, blood, body fluids, etc.) 1 5 '[]'$  - Specimen(s) / Culture(s) sent or taken to Lab for analysis 0 '[]'$  - Patient Transfer (multiple staff / Civil Service fast streamer / Similar devices) 0 '[]'$  - Simple Staple / Suture removal (25 or less) 0 '[]'$  - Complex Staple / Suture removal (26 or more) 0 '[]'$  - Hypo / Hyperglycemic Management (close monitor of Blood Glucose) 0 '[]'$  - Ankle / Brachial Index (ABI) - do not check if billed separately 0 X - Vital Signs 1 5 Has the patient been seen at the hospital within the last three years: Yes Total Score: 85 Level Of Care: New/Established - Level 3 Electronic Signature(s) Signed: 01/27/2016 5:05:22 PM By: Gretta Cool, RN, [name], [name], [name] Entered By: Gretta Cool, RN, [name], Kim on 01/27/2016 15:49:22 Lips, [name] ([id number]) -------------------------------------------------------------------------------- Encounter Discharge Information Details [place], Date of Service: 01/27/2016 1:30 PM Patient Name: [name] Patient Account Number: [id number] Medical Record Treating RN: Cornell Barman [id number] Number: Other Clinician: Date of Birth/Sex: 03-Oct-1950 (65 y.o. Male) Treating [name], MICHAEL Primary Care Physician/Extender: Tonye Pearson Physician: Referring Physician: Doristine Locks in Treatment: 3 Encounter Discharge Information  Items Schedule Follow-up Appointment: No Medication Reconciliation completed No and provided to Patient/Care Timya Trimmer: Provided on Clinical Summary of Care: 01/27/2016 Form Type Recipient Paper Patient WD Electronic Signature(s) Signed: 01/27/2016 2:00:53 PM By: Ruthine Dose Entered By: Ruthine Dose on 01/27/2016 14:00:53 Prazak, [name] ([id number]) -------------------------------------------------------------------------------- Lower Extremity Assessment Details [place], Date of Service: 01/27/2016 1:30 PM Patient Name: [facility] Patient Account Number: [id number] Medical Record Treating RN: Cornell Barman [id number] Number: Other Clinician: Date of Birth/Sex: 05/14/1951 (65 y.o. Male) Treating Dellia Nims, MICHAEL Primary Care Physician/Extender: Tonye Pearson Physician: Referring Physician: Doristine Locks in Treatment: 3 Edema Assessment Assessed: [Left: No] [Right: No] E[Left: dema] [Right: :] Calf Left: Right: Point of Measurement: 40 cm From Medial Instep 44.5 cm cm Ankle Left: Right: Point of Measurement: 13 cm From Medial Instep 28 cm cm Vascular Assessment Pulses: Posterior Tibial Dorsalis Pedis Palpable: [Left:Yes] Extremity colors, hair growth, and conditions: Extremity Color: [Left:Normal] Hair Growth on Extremity: [Left:No] Temperature of Extremity: [Left:Warm] Capillary Refill: [Left:< 3 seconds] Dependent Rubor: [Left:No] Lipodermatosclerosis: [Left:No] Toe Nail Assessment Left: Right: Thick: Yes Discolored: Yes Deformed: No Improper Length and Hygiene: No Electronic Signature(s) BODIE, ABERNETHY ([id number]) Signed: 01/27/2016 5:05:22 PM By: Gretta Cool, RN, [name], [name], [name] Entered By: Gretta Cool, RN, [name], Kim on 01/27/2016 13:41:21 [place], [name] ([id number]) -------------------------------------------------------------------------------- Multi Wound Chart Details [place], Date of Service: 01/27/2016 1:30 PM Patient Name: [name] Patient  Account Number: [id number] Medical Record Treating RN: Cornell Barman [id number] Number: Other Clinician: Date of Birth/Sex: [birth year] (65 y.o. Male) Treating [name], MICHAEL Primary Care Physician/Extender: Tonye Pearson Physician: Referring Physician: Doristine Locks in Treatment: 3 Vital Signs Height(in): 74 Pulse(bpm): 73 Weight(lbs): 240 Blood Pressure 144/89 (mmHg): Body Mass Index(BMI): 31 Temperature(F): 97.5 Respiratory Rate 18 (breaths/min): Photos: [N/A:N/A] Wound Location: Left Malleolus - Medial N/A N/A Wounding Event: Trauma N/A N/A Primary Etiology: Venous Leg Ulcer N/A N/A Comorbid  History: Arrhythmia, Hypertension, N/A N/A Gout, Neuropathy Date Acquired: 12/29/2015 N/A N/A Weeks of Treatment: 3 N/A N/A Wound Status: Open N/A N/A Measurements L x W x D 0.6x0.5x0.5 N/A N/A (cm) Area (cm) : 0.236 N/A N/A Volume (cm) : 0.118 N/A N/A % Reduction in Area: 33.10% N/A N/A % Reduction in Volume: -66.20% N/A N/A Classification: Full Thickness Without N/A N/A Exposed Support Structures Exudate Amount: Medium N/A N/A Exudate Type: Serous N/A N/A Exudate Color: amber N/A N/A Graciano, Jaydrian ([id number]) Wound Margin: Flat and Intact N/A N/A Granulation Amount: Medium (34-66%) N/A N/A Granulation Quality: Red, Pink, Pale N/A N/A Necrotic Amount: Medium (34-66%) N/A N/A Exposed Structures: Fascia: No N/A N/A Fat: No Tendon: No Muscle: No Joint: No Bone: No Limited to Skin Breakdown Epithelialization: Small (1-33%) N/A N/A Periwound Skin Texture: Edema: Yes N/A N/A Excoriation: No Induration: No Callus: No Crepitus: No Fluctuance: No Friable: No Rash: No Scarring: No Periwound Skin Moist: Yes N/A N/A Moisture: Maceration: No Dry/Scaly: No Periwound Skin Color: Hemosiderin Staining: Yes N/A N/A Mottled: Yes Atrophie Blanche: No Cyanosis: No Ecchymosis: No Erythema: No Pallor: No Rubor: No Temperature: No Abnormality N/A  N/A Tenderness on Yes N/A N/A Palpation: Wound Preparation: Ulcer Cleansing: N/A N/A Rinsed/Irrigated with Saline, Other: surg scrub and water Topical Anesthetic Applied: Other: lidocaine 4% Treatment Notes Electronic Signature(s) Signed: 01/27/2016 5:05:22 PM By: Gretta Cool, RN, [name], [name], [name] Kross, [place] ([id number]) Entered By: Gretta Cool, RN, [name], Kim on 01/27/2016 13:48:58 Novakowski, [name] ([id number]) -------------------------------------------------------------------------------- Multi-Disciplinary Care Plan Details [place], Date of Service: 01/27/2016 1:30 PM Patient Name: [facility] Patient Account Number: [id number] Medical Record Treating RN: Cornell Barman [id number] Number: Other Clinician: Date of Birth/Sex: Oct 11, 1950 (65 y.o. Male) Treating [name], MICHAEL Primary Care Physician/Extender: Tonye Pearson Physician: Referring Physician: Doristine Locks in Treatment: 3 Active Inactive Orientation to the Wound Care Program Nursing Diagnoses: Knowledge deficit related to the wound healing [name] program Goals: Patient/caregiver will verbalize understanding of the [place] Program Date Initiated: 01/06/2016 Goal Status: Active Interventions: Provide education on orientation to the wound [name] Notes: Venous Leg Ulcer Nursing Diagnoses: Actual venous Insuffiency (use after diagnosis is confirmed) Goals: Patient will maintain optimal edema control Date Initiated: 01/06/2016 Goal Status: Active Interventions: Compression as ordered Notes: Wound/Skin Impairment Nursing Diagnoses: Knowledge deficit related to ulceration/compromised skin integrity [place], [name] ([id number]) Goals: Patient/caregiver will verbalize understanding of skin care regimen Date Initiated: 01/06/2016 Goal Status: Active Ulcer/skin breakdown will have a volume reduction of 30% by week 4 Date Initiated: 01/06/2016 Goal Status: Active Ulcer/skin breakdown will have a  volume reduction of 50% by week 8 Date Initiated: 01/06/2016 Goal Status: Active Ulcer/skin breakdown will have a volume reduction of 80% by week 12 Date Initiated: 01/06/2016 Goal Status: Active Ulcer/skin breakdown will heal within 14 weeks Date Initiated: 01/06/2016 Goal Status: Active Interventions: Assess patient/caregiver ability to obtain necessary supplies Assess patient/caregiver ability to perform ulcer/skin care regimen upon admission and as needed Assess ulceration(s) every visit Notes: Electronic Signature(s) Signed: 01/27/2016 5:05:22 PM By: Gretta Cool, RN, [name], [name], [name] Entered By: Gretta Cool, RN, [name], Kim on 01/27/2016 13:48:48 Salvia, [name] ([id number]) -------------------------------------------------------------------------------- Pain Assessment Details [place], Date of Service: 01/27/2016 1:30 PM Patient Name: [name] Patient Account Number: [id number] Medical Record Treating RN: Cornell Barman [id number] Number: Other Clinician: Date of Birth/Sex: Apr 08, 1951 (65 y.o. Male) Treating [name], MICHAEL Primary Care Physician/Extender: Tonye Pearson Physician: Referring Physician: Doristine Locks in Treatment: 3 Active Problems Location of Pain Severity and Description of Pain Patient Has  Paino Yes Site Locations Pain Location: Pain in Ulcers Rate the pain. Current Pain Level: 8 Pain Management and Medication Current Pain Management: Electronic Signature(s) Signed: 01/27/2016 5:05:22 PM By: Gretta Cool, RN, [name], [name], [name] Entered By: Gretta Cool, RN, [name], Kim on 01/27/2016 13:32:16 Knack, [name] ([id number]) -------------------------------------------------------------------------------- Wound Assessment Details [place], Date of Service: 01/27/2016 1:30 PM Patient Name: [name] Patient Account Number: [id number] Medical Record Treating RN: Cornell Barman [id number] Number: Other Clinician: Date of Birth/Sex: 1950-06-06 (65 y.o. Male) Treating [name],  MICHAEL Primary Care Physician/Extender: Tonye Pearson Physician: Referring Physician: Doristine Locks in Treatment: 3 Wound Status Wound Number: 2 Primary Venous Leg Ulcer Etiology: Wound Location: Left Malleolus - Medial Wound Status: Open Wounding Event: Trauma Comorbid Arrhythmia, Hypertension, Gout, Date Acquired: 12/29/2015 History: Neuropathy Weeks Of Treatment: 3 Clustered Wound: No Photos Wound Measurements Length: (cm) 0.6 Width: (cm) 0.5 Depth: (cm) 0.5 Area: (cm) 0.236 Volume: (cm) 0.118 % Reduction in Area: 33.1% % Reduction in Volume: -66.2% Epithelialization: Small (1-33%) Wound Description Full Thickness Without Exposed Foul Odor Afte Classification: Support Structures Wound Margin: Flat and Intact Exudate Medium Amount: Exudate Type: Serous Exudate Color: amber r Cleansing: No Wound Bed Granulation Amount: Medium (34-66%) Exposed Structure Pauling, Tashawn ([id number]) Granulation Quality: Red, Pink, Pale Fascia Exposed: No Necrotic Amount: Medium (34-66%) Fat Layer Exposed: No Necrotic Quality: Adherent Slough Tendon Exposed: No Muscle Exposed: No Joint Exposed: No Bone Exposed: No Limited to Skin Breakdown Periwound Skin Texture Texture Color No Abnormalities Noted: No No Abnormalities Noted: No Callus: No Atrophie Blanche: No Crepitus: No Cyanosis: No Excoriation: No Ecchymosis: No Fluctuance: No Erythema: No Friable: No Hemosiderin Staining: Yes Induration: No Mottled: Yes Localized Edema: Yes Pallor: No Rash: No Rubor: No Scarring: No Temperature / Pain Moisture Temperature: No Abnormality No Abnormalities Noted: No Tenderness on Palpation: Yes Dry / Scaly: No Maceration: No Moist: Yes Wound Preparation Ulcer Cleansing: Rinsed/Irrigated with Saline, Other: surg scrub and water, Topical Anesthetic Applied: Other: lidocaine 4%, Electronic Signature(s) Signed: 01/27/2016 5:05:22 PM By: Gretta Cool, RN, [name],  [name], [name] Entered By: Gretta Cool, RN, [name], Kim on 01/27/2016 13:42:06 Geyer, [name] ([id number]) -------------------------------------------------------------------------------- Vitals Details [place], Date of Service: 01/27/2016 1:30 PM Patient Name: [name] Patient Account Number: [id number] Medical Record Treating RN: Cornell Barman [id number] Number: Other Clinician: Date of Birth/Sex: [birth year] (65 y.o. Male) Treating [name], MICHAEL Primary Care Physician/Extender: Tonye Pearson Physician: Referring Physician: Doristine Locks in Treatment: 3 Vital Signs Time Taken: 13:32 Temperature (F): 97.5 Height (in): 74 Pulse (bpm): 73 Weight (lbs): 240 Respiratory Rate (breaths/min): 18 Body Mass Index (BMI): 30.8 Blood Pressure (mmHg): 144/89 Reference Range: 80 - 120 mg / dl Electronic Signature(s) Signed: 01/27/2016 5:05:22 PM By: Gretta Cool, RN, [name], [name], [name] Entered By: Gretta Cool, RN, [name], Kim on 01/27/2016 13:33:09

## 2016-01-31 LAB — AEROBIC CULTURE  (SUPERFICIAL SPECIMEN)

## 2016-01-31 LAB — AEROBIC CULTURE W GRAM STAIN (SUPERFICIAL SPECIMEN): Gram Stain: NONE SEEN

## 2016-02-03 ENCOUNTER — Encounter: Payer: Medicare Other | Admitting: Internal Medicine

## 2016-02-03 DIAGNOSIS — I87332 Chronic venous hypertension (idiopathic) with ulcer and inflammation of left lower extremity: Secondary | ICD-10-CM | POA: Diagnosis not present

## 2016-02-03 NOTE — Progress Notes (Signed)
BENNEY, SOMMERVILLE ([id number]) Visit Report for 02/03/2016 Arrival Information Details Sneed, Date of Service: 02/03/2016 1:30 PM Patient Name: [facility] Patient Account Number: [id number] Medical Record Treating RN: Baruch Gouty, RN, [name], Velva Harman [id number] Number: Other Clinician: Date of Birth/Sex: 1951/03/01 (65 y.o. Male) Treating [name], MICHAEL Primary Care Physician/Extender: Tonye Pearson Physician: Referring Physician: Doristine Locks in Treatment: 4 Visit Information History Since Last Visit All ordered tests and consults were completed: No Patient Arrived: Ambulatory Added or deleted any medications: No Arrival Time: 13:39 Any new allergies or adverse reactions: No Accompanied By: self Had a fall or experienced change in No Transfer Assistance: None activities of daily living that may affect Patient Identification Verified: Yes risk of falls: Secondary Verification Process Yes Signs or symptoms of abuse/neglect since last No Completed: visito Patient Has Alerts: Yes Has Dressing in Place as Prescribed: Yes Patient Alerts: Patient on Blood Pain Present Now: No Thinner warfarin Electronic Signature(s) Signed: 02/03/2016 4:32:24 PM By: [name] [name], RN Entered By: [name] on 02/03/2016 13:40:20 Kaine, [name] ([id number]) -------------------------------------------------------------------------------- Encounter Discharge Information Details Monjaraz, Date of Service: 02/03/2016 1:30 PM Patient Name: [facility] Patient Account Number: [id number] Medical Record Treating RN: Baruch Gouty, RN, [name], Velva Harman [id number] Number: Other Clinician: Date of Birth/Sex: 10-27-1950 (65 y.o. Male) Treating [name], MICHAEL Primary Care Physician/Extender: Tonye Pearson Physician: Referring Physician: Doristine Locks in Treatment: 4 Encounter Discharge Information Items Discharge Pain Level: 0 Discharge Condition: Stable Ambulatory Status: Ambulatory Discharge  Destination: Home Transportation: Private Auto Accompanied By: self Schedule Follow-up Appointment: No Medication Reconciliation completed and provided to Patient/Care No Mehar Kirkwood: Provided on Clinical Summary of Care: 02/03/2016 Form Type Recipient Paper Patient WD Electronic Signature(s) Signed: 02/03/2016 4:32:24 PM By: [name] [name], RN Previous Signature: 02/03/2016 1:57:35 PM Version By: Ruthine Dose Entered By: [name] on 02/03/2016 13:58:07 Underhill, [name] ([id number]) -------------------------------------------------------------------------------- Lower Extremity Assessment Details Eyerly, Date of Service: 02/03/2016 1:30 PM Patient Name: [facility] Patient Account Number: [id number] Medical Record Treating RN: Baruch Gouty RN, [name], Velva Harman [id number] Number: Other Clinician: Date of Birth/Sex: [birth year] (65 y.o. Male) Treating Dellia Nims, MICHAEL Primary Care Physician/Extender: Tonye Pearson Physician: Referring Physician: Doristine Locks in Treatment: 4 Edema Assessment Assessed: [Left: No] [Right: No] E[Left: dema] [Right: :] Calf Left: Right: Point of Measurement: 40 cm From Medial Instep 44.5 cm cm Ankle Left: Right: Point of Measurement: cm From Medial Instep 28 cm cm Vascular Assessment Claudication: Claudication Assessment [Left:None] Pulses: Posterior Tibial Dorsalis Pedis Palpable: [Left:Yes] Extremity colors, hair growth, and conditions: Extremity Color: [Left:Dusky] Hair Growth on Extremity: [Left:Yes] Temperature of Extremity: [Left:Warm] Capillary Refill: [Left:< 3 seconds] Toe Nail Assessment Left: Right: Thick: Yes Discolored: Yes Deformed: No Improper Length and Hygiene: No Electronic Signature(s) SHADMAN, TOZZI ([id number]) Signed: 02/03/2016 4:32:24 PM By: [name] [name], RN Entered By: [name] on 02/03/2016 13:41:32 Brickley, [name]  ([id number]) -------------------------------------------------------------------------------- Multi Wound Chart Details Hirota, Date of Service: 02/03/2016 1:30 PM Patient Name: [name] Patient Account Number: [id number] Medical Record Treating RN: Baruch Gouty RN, [name], Velva Harman [id number] Number: Other Clinician: Date of Birth/Sex: 05-10-1951 (65 y.o. Male) Treating [name], MICHAEL Primary Care Physician/Extender: Tonye Pearson Physician: Referring Physician: Doristine Locks in Treatment: 4 Vital Signs Height(in): 74 Pulse(bpm): 69 Weight(lbs): 240 Blood Pressure 151/82 (mmHg): Body Mass Index(BMI): 31 Temperature(F): 97.7 Respiratory Rate 20 (breaths/min): Photos: [2:No Photos] [N/A:N/A] Wound Location: [2:Left Malleolus - Medial] [N/A:N/A] Wounding Event: [2:Trauma] [N/A:N/A] Primary Etiology: [2:Venous Leg Ulcer] [N/A:N/A] Comorbid History: [2:Arrhythmia, Hypertension, N/A Gout, Neuropathy] Date Acquired: [2:12/29/2015] [N/A:N/A] Weeks of Treatment: [2:4] [  N/A:N/A] Wound Status: [2:Open] [N/A:N/A] Measurements L x W x D 0.1x0.1x0.1 [N/A:N/A] (cm) Area (cm) : [2:0.008] [N/A:N/A] Volume (cm) : [2:0.001] [N/A:N/A] % Reduction in Area: [2:97.70%] [N/A:N/A] % Reduction in Volume: 98.60% [N/A:N/A] Classification: [2:Full Thickness Without Exposed Support Structures] [N/A:N/A] Exudate Amount: [2:None Present] [N/A:N/A] Wound Margin: [2:Flat and Intact] [N/A:N/A] Granulation Amount: [2:None Present (0%)] [N/A:N/A] Necrotic Amount: [2:Large (67-100%)] [N/A:N/A] Necrotic Tissue: [2:Eschar] [N/A:N/A] Exposed Structures: [2:Fascia: No Fat: No Tendon: No Muscle: No] [N/A:N/A] Joint: No Bone: No Limited to Skin Breakdown Epithelialization: Large (67-100%) N/A N/A Periwound Skin Texture: Edema: Yes N/A N/A Excoriation: No Induration: No Callus: No Crepitus: No Fluctuance: No Friable: No Rash: No Scarring: No Periwound Skin Dry/Scaly: Yes N/A  N/A Moisture: Maceration: No Moist: No Periwound Skin Color: Hemosiderin Staining: Yes N/A N/A Mottled: Yes Atrophie Blanche: No Cyanosis: No Ecchymosis: No Erythema: No Pallor: No Rubor: No Temperature: No Abnormality N/A N/A Tenderness on Yes N/A N/A Palpation: Wound Preparation: Ulcer Cleansing: N/A N/A Rinsed/Irrigated with Saline, Other: surg scrub and water Topical Anesthetic Applied: Other: lidocaine 4% Treatment Notes Electronic Signature(s) Signed: 02/03/2016 4:32:24 PM By: [name] [name], RN Entered By: [name] on 02/03/2016 13:49:44 Lwin, [name] ([id number]) -------------------------------------------------------------------------------- Multi-Disciplinary Care Plan Details Cosman, Date of Service: 02/03/2016 1:30 PM Patient Name: [facility] Patient Account Number: [id number] Medical Record Treating RN: Baruch Gouty RN, [name], Velva Harman [id number] Number: Other Clinician: Date of Birth/Sex: 1951/01/05 (65 y.o. Male) Treating [name], MICHAEL Primary Care Physician/Extender: Tonye Pearson Physician: Referring Physician: Doristine Locks in Treatment: 4 Active Inactive Orientation to the Wound Care Program Nursing Diagnoses: Knowledge deficit related to the wound healing center program Goals: Patient/caregiver will verbalize understanding of the [place] Program Date Initiated: 01/06/2016 Goal Status: Active Interventions: Provide education on orientation to the wound center Notes: Venous Leg Ulcer Nursing Diagnoses: Actual venous Insuffiency (use after diagnosis is confirmed) Goals: Patient will maintain optimal edema control Date Initiated: 01/06/2016 Goal Status: Active Interventions: Compression as ordered Notes: Wound/Skin Impairment Nursing Diagnoses: Knowledge deficit related to ulceration/compromised skin integrity Welling, [name] ([id number]) Goals: Patient/caregiver will verbalize understanding of skin care  regimen Date Initiated: 01/06/2016 Goal Status: Active Ulcer/skin breakdown will have a volume reduction of 30% by week 4 Date Initiated: 01/06/2016 Goal Status: Active Ulcer/skin breakdown will have a volume reduction of 50% by week 8 Date Initiated: 01/06/2016 Goal Status: Active Ulcer/skin breakdown will have a volume reduction of 80% by week 12 Date Initiated: 01/06/2016 Goal Status: Active Ulcer/skin breakdown will heal within 14 weeks Date Initiated: 01/06/2016 Goal Status: Active Interventions: Assess patient/caregiver ability to obtain necessary supplies Assess patient/caregiver ability to perform ulcer/skin care regimen upon admission and as needed Assess ulceration(s) every visit Notes: Electronic Signature(s) Signed: 02/03/2016 4:32:24 PM By: [name] [name], RN Entered By: [name] on 02/03/2016 13:49:32 Chachere, [name] ([id number]) -------------------------------------------------------------------------------- Pain Assessment Details Blackley, Date of Service: 02/03/2016 1:30 PM Patient Name: [name] Patient Account Number: [id number] Medical Record Treating RN: Baruch Gouty RN, [name], Velva Harman [id number] Number: Other Clinician: Date of Birth/Sex: Sep 24, 1950 (65 y.o. Male) Treating [name] Primary Care Physician/Extender: Tonye Pearson Physician: Referring Physician: Doristine Locks in Treatment: 4 Active Problems Location of Pain Severity and Description of Pain Patient Has Paino No Site Locations With Dressing Change: No Pain Management and Medication Current Pain Management: Electronic Signature(s) Signed: 02/03/2016 4:32:24 PM By: [name] [name], RN Entered By: [name] on 02/03/2016 13:40:29 Kozakiewicz, [name] ([id number]) -------------------------------------------------------------------------------- Patient/Caregiver Education Details Sokolowski, Date of Service: 02/03/2016 1:30 PM Patient Name: [name]  Patient Account Number:  [id number] Medical Record Treating RN: Baruch Gouty, RN, [name], Velva Harman [id number] Number: Other Clinician: Date of Birth/Gender: June 04, 1950 (65 y.o. Male) Treating [name], MICHAEL Primary Care Physician/Extender: Tonye Pearson Physician: Suella Grove in Treatment: 4 Referring Physician: Royetta Crochet Education Assessment Education Provided To: Patient Education Topics Provided Welcome To [facility]: Methods: Explain/Verbal Responses: State content correctly Electronic Signature(s) Signed: 02/03/2016 4:32:24 PM By: [name] [name], RN Entered By: [name] on 02/03/2016 13:58:17 Clausen, [name] ([id number]) -------------------------------------------------------------------------------- Wound Assessment Details Nanda, Date of Service: 02/03/2016 1:30 PM Patient Name: [facility] Patient Account Number: [id number] Medical Record Treating RN: Baruch Gouty, RN, [name], Velva Harman [id number] Number: Other Clinician: Date of Birth/Sex: January 28, 1951 (65 y.o. Male) Treating [name], MICHAEL Primary Care Physician/Extender: Tonye Pearson Physician: Referring Physician: Doristine Locks in Treatment: 4 Wound Status Wound Number: 2 Primary Venous Leg Ulcer Etiology: Wound Location: Left, Medial Malleolus Wound Status: Open Wounding Event: Trauma Comorbid Arrhythmia, Hypertension, Gout, Date Acquired: 12/29/2015 History: Neuropathy Weeks Of Treatment: 4 Clustered Wound: No Photos Photo Uploaded By: [name] on 02/03/2016 16:31:34 Wound Measurements Length: (cm) 0.4 Width: (cm) 0.4 Depth: (cm) 0.2 Area: (cm) 0.126 Volume: (cm) 0.025 % Reduction in Area: 64.3% % Reduction in Volume: 64.8% Epithelialization: Large (67-100%) Tunneling: No Undermining: No Wound Description Full Thickness Without Exposed Classification: Support Structures Wound Margin: Flat and Intact Exudate None Present Amount: Foul Odor After Cleansing: No Wound Bed Granulation Amount: None Present (0%)  Exposed Structure Necrotic Amount: Large (67-100%) Fascia Exposed: No Davies, Tavis ([id number]) Necrotic Quality: Eschar Fat Layer Exposed: No Tendon Exposed: No Muscle Exposed: No Joint Exposed: No Bone Exposed: No Limited to Skin Breakdown Periwound Skin Texture Texture Color No Abnormalities Noted: No No Abnormalities Noted: No Callus: No Atrophie Blanche: No Crepitus: No Cyanosis: No Excoriation: No Ecchymosis: No Fluctuance: No Erythema: No Friable: No Hemosiderin Staining: Yes Induration: No Mottled: Yes Localized Edema: Yes Pallor: No Rash: No Rubor: No Scarring: No Temperature / Pain Moisture Temperature: No Abnormality No Abnormalities Noted: No Tenderness on Palpation: Yes Dry / Scaly: Yes Maceration: No Moist: No Wound Preparation Ulcer Cleansing: Rinsed/Irrigated with Saline, Other: surg scrub and water, Topical Anesthetic Applied: Other: lidocaine 4%, Treatment Notes Wound #2 (Left, Medial Malleolus) 1. Cleansed with: Clean wound with Normal Saline 4. Dressing Applied: Aquacel Ag 5. Secondary [place] 7. Secured with Support Garment 20-30 mm/Hg pressure to: Electronic Signature(s) Signed: 02/03/2016 4:32:24 PM By: [name] [name], RN Entered By: [name] on 02/03/2016 13:52:57 Zurawski, [name] ([id number]) -------------------------------------------------------------------------------- Vitals Details Spagnolo, Date of Service: 02/03/2016 1:30 PM Patient Name: [facility] Patient Account Number: [id number] Medical Record Treating RN: Baruch Gouty, RN, [name], Velva Harman [id number] Number: Other Clinician: Date of Birth/Sex: 1950/12/15 (65 y.o. Male) Treating [name], MICHAEL Primary Care Physician/Extender: Tonye Pearson Physician: Referring Physician: Doristine Locks in Treatment: 4 Vital Signs Time Taken: 13:41 Temperature (F): 97.7 Height (in): 74 Pulse (bpm): 69 Weight (lbs): 240 Respiratory Rate  (breaths/min): 20 Body Mass Index (BMI): 30.8 Blood Pressure (mmHg): 151/82 Reference Range: 80 - 120 mg / dl Electronic Signature(s) Signed: 02/03/2016 4:32:24 PM By: [name] [name], RN Entered By: [name] on 02/03/2016 13:41:07

## 2016-02-03 NOTE — Progress Notes (Signed)
NOBUO, NUNZIATA ([id number]) Visit Report for 02/03/2016 Chief Complaint Document Details [place], Date of Service: 02/03/2016 1:30 PM Patient Name: [facility] Patient Account Number: [id number] Medical Record Treating RN: Baruch Gouty RN, [name], Velva Harman [id number] Number: Other Clinician: Date of Birth/Sex: 07/09/1950 (65 y.o. Male) Treating Dellia Nims, Gwyn Hieronymus Primary Care Physician/Extender: Tonye Pearson Physician: Referring Physician: Doristine Locks in Treatment: 4 Information Obtained from: Patient Chief Complaint Patient presents for treatment of an open ulcer due to venous insufficiency. He says he noticed a "into the right medial ankle for about 3-4 weeks 01/06/16; patient reaper sensitive today for review of a wound over his left medial ankle there is been present for one week Electronic Signature(s) Signed: 02/03/2016 3:24:48 PM By: [name] MD Entered By: [name] on 02/03/2016 13:56:52 [place], [name] ([id number]) -------------------------------------------------------------------------------- Debridement Details Sorber, Date of Service: 02/03/2016 1:30 PM Patient Name: [name] Patient Account Number: [id number] Medical Record Treating RN: Baruch Gouty RN, [name], Velva Harman [id number] Number: Other Clinician: Date of Birth/Sex: [birth year] (65 y.o. Male) Treating Lucas Exline Primary Care Physician/Extender: Tonye Pearson Physician: Referring Physician: Doristine Locks in Treatment: 4 Debridement Performed for Wound #2 Left,Medial Malleolus Assessment: Performed By: Physician Ricard Dillon, MD Debridement: Open Wound/Selective Debridement Selective Description: Pre-procedure Yes - 13:46 Verification/Time Out Taken: Start Time: 13:46 Pain Control: Lidocaine 4% Topical Solution Level: Skin/Dermis Total Area Debrided (L x 0.1 (cm) x 0.1 (cm) = 0.01 (cm) W): Tissue and other Non-Viable, Fibrin/Slough, Skin, Subcutaneous material  debrided: Instrument: Curette Bleeding: None End Time: 13:50 Procedural Pain: 0 Post Procedural Pain: 0 Response to Treatment: Procedure was tolerated well Post Debridement Measurements of Total Wound Length: (cm) 0.4 Width: (cm) 0.4 Depth: (cm) 0.2 Volume: (cm) 0.025 Character of Wound/Ulcer Post Stable Debridement: Severity of Tissue Post Debridement: Fat layer exposed Post Procedure Diagnosis Same as Pre-procedure Electronic Signature(s) CARLIS, BLANCHARD ([id number]) Signed: 02/03/2016 3:24:48 PM By: [name] MD Signed: 02/03/2016 4:32:24 PM By: [name] [name], RN Entered By: [name] on 02/03/2016 13:56:16 [place], [name] ([id number]) -------------------------------------------------------------------------------- HPI Details Ciszewski, Date of Service: 02/03/2016 1:30 PM Patient Name: [facility] Patient Account Number: [id number] Medical Record Treating RN: Baruch Gouty RN, [name], Velva Harman [id number] Number: Other Clinician: Date of Birth/Sex: Jan 07, 1951 (65 y.o. Male) Treating Rondall Radigan Primary Care Physician/Extender: Tonye Pearson Physician: Referring Physician: Doristine Locks in Treatment: 4 History of Present Illness Location: open wound just above his right ankle Quality: Patient reports experiencing a dull pain to affected area(s). Severity: Patient states wound are getting worse. Duration: Patient has had the wound for < 4 weeks prior to presenting for treatment Timing: Pain in wound is Intermittent (comes and goes Context: The wound appeared gradually over time Modifying Factors: Consults to this date include: he was seen in the ER and was referred to a vascular surgeon but the patient has not done that. He may have been treated with clindamycin in the ER. Associated Signs and Symptoms: Patient reports having difficulty standing for long periods. HPI Description: 65 year old gentleman who was seen in the emergency department recently  on 01/06/2015 for a wound of his right lower extremity which he says was not involving any injury and he did not know how he sustained it. He had draining foul-smelling liquid from the area and had gone for care there. his past medical history is significant for DVT, hypertension, gout, tobacco abuse, cocaine abuse, stroke, atrial fibrillation, pulmonary embolism. he has also had some vascular surgery with a stent placed in his leg. He has been a smoker for  many years and has given up straight drugs several years ago. He continues to smoke about 4-5 cigarettes a day. 02/03/2015 -- received a note from 05/14/2013 where Dr. Leotis Pain placed an inferior vena cava filter. The patient had a deep vein thrombosis while therapeutic on anticoagulation for previous DVT and a IVC filter was placed for this. 02/10/2015 -- he did have his vascular test done on Friday but we have no reports yet. 02/17/2015 -- notes were reviewed from the vascular office and the patient had a venous ultrasound done which revealed that he had no reflux in the greater saphenous vein or the short saphenous vein bilaterally. He did have subacute DVT in the common femoral vein and popliteal veins on the right and left side. The recommendation was to continue with Unna's boot therapy at the wound clinic and then to wear graduated compression stockings once the ulcers healed and later if he had continuous problems lymphedema pump would benefit him. 03/17/2015 -- we have applied for his insurance and aide regarding cellular tissue-based products and are still awaiting the final clearance. 03/24/2015 -- he has had Apligraf authorized for him but his wound is looking so good today that we may not use it. 03/31/2015 -- he has not yet received his compression stockings though we have called a couple of times and hopefully they should arrive this week. AMUN, STEMM ([id number]) [place] 01/06/16; this is a patient we have  previously cared for in this clinic with wounds on his right medial ankle. I was not previously involved with his care. 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 He wears compression stockings. He had reflux studies done during his last stay in this clinic which did not show significant reflux in the greater or lesser saphenous veins bilaterally. His history is that he developed a open sore on the left medial malleolus one week ago. He was seen in his primary physician office and given a course of doxycycline which he still should be on. Previously seen vascular surgery who felt that he had some degree of lymphedema as well. He is not a diabetic 01/13/16 no major change 01/20/16; very small wound on the medial right ankle again covered with surface slough that doesn't seem to be spotting the Prisma 01/27/16; patient comes in today complaining of a lot of pain around the wound site. He has not been systemically unwell. 02/03/16; the patient's wound culture last week grew Proteus, I had empirically given doxycycline. The Proteus was not specifically plated against doxycycline however Proteus itself was fairly pansensitive and the patient comes back feeling a lot better today. I think the doxycycline was likely to be successful in sufficient Electronic Signature(s) Signed: 02/03/2016 3:24:48 PM By: [name] MD Entered By: [name] on 02/03/2016 13:58:40 [place], [name] ([id number]) -------------------------------------------------------------------------------- Physical Exam Details Fichter, Date of Service: 02/03/2016 1:30 PM Patient Name: [name] Patient Account Number: [id number] Medical Record Treating RN: Baruch Gouty RN, [name], Velva Harman [id number] Number: Other Clinician: Date of Birth/Sex: 05-Oct-1950 (65 y.o. Male) Treating Aniello Christopoulos Primary Care Physician/Extender: Tonye Pearson Physician: Referring Physician: Doristine Locks in Treatment: 4 Notes Wound exam; now a very small open area with no purulent drainage. The erythema/discoloration around the wound last week is resolved and he is a lot less tender. Selective debridement done of circumferential callus Electronic Signature(s) Signed: 02/03/2016 3:24:48 PM By: [name]  MD Entered By: [name] on 02/03/2016 13:59:22 Wohl, [name] ([id number]) -------------------------------------------------------------------------------- Physician Orders Details Kalis, Date of Service: 02/03/2016 1:30 PM Patient Name: [facility] Patient Account Number: [id number] Medical Record Treating RN: Baruch Gouty RN, [name], Velva Harman [id number] Number: Other Clinician: Date of Birth/Sex: November 07, 1950 (65 y.o. Male) Treating Lavaya Defreitas Primary Care Physician/Extender: Tonye Pearson Physician: Referring Physician: Doristine Locks in Treatment: 4 Verbal / Phone Orders: Yes Clinician: Afful, RN, [name], Rita Read Back and Verified: Yes Diagnosis Coding Wound Cleansing Wound #2 Left,Medial Malleolus o Clean wound with wound cleanser. Anesthetic Wound #2 Left,Medial Malleolus o Topical Lidocaine 4% cream applied to wound bed prior to debridement Skin Barriers/Peri-Wound Care Wound #2 Left,Medial Malleolus o Barrier cream - desitin Primary Wound Dressing Wound #2 Left,Medial Malleolus o Aquacel Ag Secondary Dressing Wound #2 Left,Medial Malleolus o Non-adherent pad - telfa island Dressing Change Frequency Wound #2 Left,Medial Malleolus o Change Dressing Monday, Wednesday, Friday Follow-up Appointments Wound #2 Left,Medial Malleolus o Return Appointment in 1 week. Edema Control Wound #2 Left,Medial Malleolus o Elevate legs to the level of the heart and pump ankles as often as possible Boddy, [name] ([id number]) Electronic Signature(s) Signed: 02/03/2016 3:24:48 PM By:  [name] MD Signed: 02/03/2016 4:32:24 PM By: [name] [name], RN Entered By: [name] on 02/03/2016 13:59:12 [place], [name] ([id number]) -------------------------------------------------------------------------------- Problem List Details [place], Date of Service: 02/03/2016 1:30 PM Patient Name: [facility] Patient Account Number: [id number] Medical Record Treating RN: Baruch Gouty RN, [name], Velva Harman [id number] Number: Other Clinician: Date of Birth/Sex: 07-Jul-1950 (65 y.o. 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 01/06/2016 Yes breakdown of skin I87.332 Chronic venous hypertension (idiopathic) with ulcer and 01/06/2016 Yes inflammation of left lower extremity Inactive Problems Resolved Problems Electronic Signature(s) Signed: 02/03/2016 3:24:48 PM By: [name] MD Entered By: [name] on 02/03/2016 13:54:23 [place], [name] ([id number]) -------------------------------------------------------------------------------- Progress Note Details [place], Date of Service: 02/03/2016 1:30 PM Patient Name: [name] Patient Account Number: [id number] Medical Record Treating RN: Baruch Gouty RN, [name], Velva Harman [id number] Number: Other Clinician: Date of Birth/Sex: Oct 17, 1950 (65 y.o. Male) Treating Dellia Nims, Mikel Pyon Primary Care Physician/Extender: Tonye Pearson Physician: Referring Physician: Doristine Locks in Treatment: 4 Subjective Chief Complaint Information obtained from Patient Patient presents for treatment of an open ulcer due to venous insufficiency. He says he noticed a "into the right medial ankle for about 3-4 weeks 01/06/16; patient reaper sensitive today for review of a wound over his left medial ankle there is been  present for one week History of Present Illness (HPI) The following HPI elements were documented for the patient's wound: Location: open wound just above his right ankle Quality: Patient reports experiencing a dull pain to affected area(s). Severity: Patient states wound are getting worse. Duration: Patient has had the wound for < 4 weeks prior to presenting for treatment Timing: Pain in wound is Intermittent (comes and goes Context: The wound appeared gradually over time Modifying Factors: Consults to this date include: he was seen in the ER and was referred to a vascular surgeon but the patient has not done that. He may have been treated with clindamycin in the ER. Associated Signs and Symptoms: Patient reports having difficulty standing for long periods. 65 year old gentleman who was seen in the emergency department recently on 01/06/2015 for a wound of his right lower extremity which he says was not involving  any injury and he did not know how he sustained it. He had draining foul-smelling liquid from the area and had gone for care there. his past medical history is significant for DVT, hypertension, gout, tobacco abuse, cocaine abuse, stroke, atrial fibrillation, pulmonary embolism. he has also had some vascular surgery with a stent placed in his leg. He has been a smoker for many years and has given up straight drugs several years ago. He continues to smoke about 4-5 cigarettes a day. 02/03/2015 -- received a note from 05/14/2013 where Dr. Leotis Pain placed an inferior vena cava filter. The patient had a deep vein thrombosis while therapeutic on anticoagulation for previous DVT and a IVC filter was placed for this. 02/10/2015 -- he did have his vascular test done on Friday but we have no reports yet. 02/17/2015 -- notes were reviewed from the vascular office and the patient had a venous ultrasound done which revealed that he had no reflux in the greater saphenous vein or the short  saphenous vein bilaterally. He did have subacute DVT in the common femoral vein and popliteal veins on the right and left side. The recommendation was to continue with Unna's boot therapy at the wound clinic and then to wear graduated Dilmore, Erhardt ([id number]) compression stockings once the ulcers healed and later if he had continuous problems lymphedema pump would benefit him. 03/17/2015 -- we have applied for his insurance and aide regarding cellular tissue-based products and are still awaiting the final clearance. 03/24/2015 -- he has had Apligraf authorized for him but his wound is looking so good today that we may not use it. 03/31/2015 -- he has not yet received his compression stockings though we have called a couple of times and hopefully they should arrive this week. READMISSION 01/06/16; this is a patient we have previously cared for in this clinic with wounds on his right medial ankle. I was not previously involved with his care. 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 He wears compression stockings. He had reflux studies done during his last stay in this clinic which did not show significant reflux in the greater or lesser saphenous veins bilaterally. His history is that he developed a open sore on the left medial malleolus one week ago. He was seen in his primary physician office and given a course of doxycycline which he still should be on. Previously seen vascular surgery who felt that he had some degree of lymphedema as well. He is not a diabetic 01/13/16 no major change 01/20/16; very small wound on the medial right ankle again covered with surface slough that doesn't seem to be spotting the Prisma 01/27/16; patient comes in today complaining of a lot of pain around the wound site. He has not been systemically unwell. 02/03/16; the patient's wound culture last week grew  Proteus, I had empirically given doxycycline. The Proteus was not specifically plated against doxycycline however Proteus itself was fairly pansensitive and the patient comes back feeling a lot better today. I think the doxycycline was likely to be successful in sufficient Objective Constitutional Vitals Time Taken: 1:41 PM, Height: 74 in, Weight: 240 lbs, BMI: 30.8, Temperature: 97.7 F, Pulse: 69 bpm, Respiratory Rate: 20 breaths/min, Blood Pressure: 151/82 mmHg. Integumentary (Hair, Skin) Wound #2 status is Open. Original cause of wound was Trauma. The wound is located on the Left,Medial Malleolus. The wound measures 0.4cm  length x 0.4cm width x 0.2cm depth; 0.126cm^2 area and 0.025cm^3 volume. The wound is limited to skin breakdown. There is no tunneling or undermining noted. There is a none present amount of drainage noted. The wound margin is flat and intact. There is no granulation within the wound bed. There is a large (67-100%) amount of necrotic tissue within the wound bed including Eschar. The periwound skin appearance exhibited: Localized Edema, Dry/Scaly, Hemosiderin Staining, Mottled. The periwound skin appearance did not exhibit: Callus, Crepitus, Excoriation, Fluctuance, Friable, Induration, Rash, Scarring, Maceration, Moist, Atrophie Blanche, Cyanosis, Ecchymosis, Pallor, Rubor, Erythema. Periwound temperature was noted as No Abnormality. The periwound has tenderness on Bents, [place] ([id number]) palpation. 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 There was a Skin/Dermis Open Wound/Selective [phone]) debridement with total area of 0.01 sq cm performed by Ricard Dillon, MD. with the following  instrument(s): Curette to remove Non-Viable tissue/material including Fibrin/Slough, Skin, and Subcutaneous after achieving pain control using Lidocaine 4% Topical Solution. A time out was conducted at 13:46, prior to the start of the procedure. There was no bleeding. The procedure was tolerated well with a pain level of 0 throughout and a pain level of 0 following the procedure. Post Debridement Measurements: 0.4cm length x 0.4cm width x 0.2cm depth; 0.025cm^3 volume. Character of Wound/Ulcer Post Debridement is stable. Severity of Tissue Post Debridement is: Fat layer exposed. Post procedure Diagnosis Wound #2: Same as Pre-Procedure Plan Wound Cleansing: Wound #2 Left,Medial Malleolus: Clean wound with wound cleanser. Anesthetic: Wound #2 Left,Medial Malleolus: Topical Lidocaine 4% cream applied to wound bed prior to debridement Skin Barriers/Peri-Wound Care: Wound #2 Left,Medial Malleolus: Barrier cream - desitin Primary Wound Dressing: Pickerill, Javan ([id number]) Wound #2 Left,Medial Malleolus: Aquacel Ag Secondary Dressing: Wound #2 Left,Medial Malleolus: Non-adherent pad - telfa island Dressing Change Frequency: Wound #2 Left,Medial Malleolus: Change Dressing Monday, Wednesday, Friday Follow-up Appointments: Wound #2 Left,Medial Malleolus: Return Appointment in 1 week. Edema Control: Wound #2 Left,Medial Malleolus: Elevate legs to the level of the heart and pump ankles as often as possible The area on the left medial malleolus appears to be closing no need for additional antibiotics Electronic Signature(s) Signed: 02/03/2016 3:24:48 PM By: [name] MD Entered By: [name] on 02/03/2016 14:00:17 [place], [name] ([id number]) -------------------------------------------------------------------------------- SuperBill Details [place], Date of Service: 02/03/2016 Patient Name: [name] Patient Account Number: [id number] Medical Record Treating RN:  Baruch Gouty RN, [name], Velva Harman [id number] Number: Other Clinician: Date of Birth/Sex: 04-11-1951 (65 y.o. 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

## 2016-02-10 ENCOUNTER — Encounter: Payer: Medicare Other | Admitting: Internal Medicine

## 2016-02-10 DIAGNOSIS — I87332 Chronic venous hypertension (idiopathic) with ulcer and inflammation of left lower extremity: Secondary | ICD-10-CM | POA: Diagnosis not present

## 2016-02-11 NOTE — Progress Notes (Signed)
BEAUREGARD, JARRELLS ([id number]) Visit Report for 02/10/2016 Arrival Information Details Porchia, Date of Service: 02/10/2016 10:00 AM Patient Name: [facility] Patient Account Number: [id number] Medical Record Treating RN: Baruch Gouty, RN, [name], Velva Harman [id number] Number: Other Clinician: Date of Birth/Sex: 1950-10-29 (65 y.o. Male) Treating [name], MICHAEL Primary Care Physician/Extender: Tonye Pearson Physician: Referring Physician: Doristine Locks in Treatment: 5 Visit Information History Since Last Visit All ordered tests and consults were completed: No Patient Arrived: Ambulatory Added or deleted any medications: No Arrival Time: 09:59 Any new allergies or adverse reactions: No Accompanied By: self Had a fall or experienced change in No Transfer Assistance: None activities of daily living that may affect Patient Identification Verified: Yes risk of falls: Secondary Verification Process Yes Signs or symptoms of abuse/neglect since last No Completed: visito Patient Requires Transmission- No Hospitalized since last visit: No Based Precautions: Has Dressing in Place as Prescribed: Yes Patient Has Alerts: Yes Pain Present Now: No Patient Alerts: Patient on Blood Thinner warfarin Electronic Signature(s) Signed: 02/10/2016 4:53:50 PM By: [name] [name], RN Entered By: [name] on 02/10/2016 10:00:34 Erisman, [name] ([id number]) -------------------------------------------------------------------------------- Clinic Level of Care Assessment Details Mcdermid, Date of Service: 02/10/2016 10:00 AM Patient Name: [facility] Patient Account Number: [id number] Medical Record Treating RN: Afful, RN, [name], Velva Harman [id number] Number: Other Clinician: Date of Birth/Sex: 04/07/1951 (65 y.o. Male) Treating [name], MICHAEL Primary Care Physician/Extender: Tonye Pearson Physician: Referring Physician: Doristine Locks in Treatment: 5 Clinic Level of Care Assessment Items TOOL 4  Quantity Score '[]'$  - Use when only an EandM is performed on FOLLOW-UP visit 0 ASSESSMENTS - Nursing Assessment / Reassessment X - Reassessment of Co-morbidities (includes updates in patient status) 1 10 X - Reassessment of Adherence to Treatment Plan 1 5 ASSESSMENTS - Wound and Skin Assessment / Reassessment X - Simple Wound Assessment / Reassessment - one wound 1 5 '[]'$  - Complex Wound Assessment / Reassessment - multiple wounds 0 '[]'$  - Dermatologic / Skin Assessment (not related to wound area) 0 ASSESSMENTS - Focused Assessment '[]'$  - Circumferential Edema Measurements - multi extremities 0 '[]'$  - Nutritional Assessment / Counseling / Intervention 0 X - Lower Extremity Assessment (monofilament, tuning fork, pulses) 1 5 '[]'$  - Peripheral Arterial Disease Assessment (using hand held doppler) 0 ASSESSMENTS - Ostomy and/or Continence Assessment and Care '[]'$  - Incontinence Assessment and Management 0 '[]'$  - Ostomy Care Assessment and Management (repouching, etc.) 0 PROCESS - Coordination of Care X - Simple Patient / Family Education for ongoing care 1 15 '[]'$  - Complex (extensive) Patient / Family Education for ongoing care 0 '[]'$  - Staff obtains Consents, Records, Test Results / Process Orders 0 Walla, [name] ([id number]) '[]'$  - Staff telephones HHA, Nursing Homes / Clarify orders / etc 0 '[]'$  - Routine Transfer to another Facility (non-emergent condition) 0 '[]'$  - Routine Hospital Admission (non-emergent condition) 0 '[]'$  - New Admissions / Biomedical engineer / Ordering NPWT, Apligraf, etc. 0 '[]'$  - Emergency Hospital Admission (emergent condition) 0 '[]'$  - Simple Discharge Coordination 0 '[]'$  - Complex (extensive) Discharge Coordination 0 PROCESS - Special Needs '[]'$  - Pediatric / Minor Patient Management 0 '[]'$  - Isolation Patient Management 0 '[]'$  - Hearing / Language / Visual special needs 0 '[]'$  - Assessment of Community assistance (transportation, D/C planning, etc.) 0 '[]'$  - Additional assistance / Altered  mentation 0 '[]'$  - Support Surface(s) Assessment (bed, cushion, seat, etc.) 0 INTERVENTIONS - Wound Cleansing / Measurement '[]'$  - Simple Wound Cleansing - one wound 0 '[]'$  - Complex Wound Cleansing -  multiple wounds 0 X - Wound Imaging (photographs - any number of wounds) 1 5 '[]'$  - Wound Tracing (instead of photographs) 0 '[]'$  - Simple Wound Measurement - one wound 0 '[]'$  - Complex Wound Measurement - multiple wounds 0 INTERVENTIONS - Wound Dressings '[]'$  - Small Wound Dressing one or multiple wounds 0 '[]'$  - Medium Wound Dressing one or multiple wounds 0 '[]'$  - Large Wound Dressing one or multiple wounds 0 '[]'$  - Application of Medications - topical 0 '[]'$  - Application of Medications - injection 0 [name], Harjot ([id number]) INTERVENTIONS - Miscellaneous '[]'$  - External ear exam 0 '[]'$  - Specimen Collection (cultures, biopsies, blood, body fluids, etc.) 0 '[]'$  - Specimen(s) / Culture(s) sent or taken to Lab for analysis 0 '[]'$  - Patient Transfer (multiple staff / Harrel Lemon Lift / Similar devices) 0 '[]'$  - Simple Staple / Suture removal (25 or less) 0 '[]'$  - Complex Staple / Suture removal (26 or more) 0 '[]'$  - Hypo / Hyperglycemic Management (close monitor of Blood Glucose) 0 '[]'$  - Ankle / Brachial Index (ABI) - do not check if billed separately 0 X - Vital Signs 1 5 Has the patient been seen at the hospital within the last three years: Yes Total Score: 50 Level Of Care: New/Established - Level 2 Electronic Signature(s) Signed: 02/10/2016 4:53:50 PM By: [name] [name], RN Entered By: [name] on 02/10/2016 10:35:18 Chestang, [name] ([id number]) -------------------------------------------------------------------------------- Encounter Discharge Information Details Kisiel, Date of Service: 02/10/2016 10:00 AM Patient Name: [facility] Patient Account Number: [id number] Medical Record Treating RN: Baruch Gouty RN, [name], Velva Harman [id number] Number: Other Clinician: Date of Birth/Sex: 1950/06/21 (65 y.o. Male) Treating  [name], MICHAEL Primary Care Physician/Extender: Tonye Pearson Physician: Referring Physician: Doristine Locks in Treatment: 5 Encounter Discharge Information Items Discharge Pain Level: 0 Discharge Condition: Stable Ambulatory Status: Ambulatory Discharge Destination: Home Transportation: Private Auto Accompanied By: self Schedule Follow-up Appointment: No Medication Reconciliation completed and provided to Patient/Care No Oz Gammel: Provided on Clinical Summary of Care: 02/10/2016 Form Type Recipient Paper Patient WD Electronic Signature(s) Signed: 02/10/2016 10:36:21 AM By: [name] [name], RN Previous Signature: 02/10/2016 10:19:45 AM Version By: Ruthine Dose Entered By: [name] on 02/10/2016 10:36:21 Ey, [name] ([id number]) -------------------------------------------------------------------------------- Lower Extremity Assessment Details Amer, Date of Service: 02/10/2016 10:00 AM Patient Name: [name] Patient Account Number: [id number] Medical Record Treating RN: Baruch Gouty RN, [name], Velva Harman [id number] Number: Other Clinician: Date of Birth/Sex: 1950/06/29 (65 y.o. Male) Treating [name], MICHAEL Primary Care Physician/Extender: Tonye Pearson Physician: Referring Physician: Doristine Locks in Treatment: 5 Vascular Assessment Pulses: Posterior Tibial Dorsalis Pedis Palpable: [Left:Yes] Extremity colors, hair growth, and conditions: Extremity Color: [Left:Dusky] Hair Growth on Extremity: [Left:Yes] Temperature of Extremity: [Left:Warm] Capillary Refill: [Left:< 3 seconds] Toe Nail Assessment Left: Right: Thick: Yes Discolored: Yes Deformed: No Improper Length and Hygiene: Yes Electronic Signature(s) Signed: 02/10/2016 4:53:50 PM By: [name] [name], RN Entered By: [name] on 02/10/2016 10:05:45 Musco, [name] ([id number]) -------------------------------------------------------------------------------- Multi Wound Chart  Details Frank, Date of Service: 02/10/2016 10:00 AM Patient Name: [name] Patient Account Number: [id number] Medical Record Treating RN: Baruch Gouty RN, [name], Velva Harman [id number] Number: Other Clinician: Date of Birth/Sex: Feb 09, 1951 (65 y.o. 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] [  N/A:N/A] Comorbid History: [2:Arrhythmia, Hypertension, N/A Gout, Neuropathy] Date Acquired: [2:12/29/2015] [N/A:N/A] Weeks of Treatment: [2:5] [N/A:N/A] Wound Status: [2:Healed - Epithelialized] [N/A:N/A] Measurements L x W x D 0x0x0 [N/A:N/A] (cm) Area (cm) : [2:0] [N/A:N/A] Volume (cm) : [2:0] [N/A:N/A] % Reduction in Area: [2:100.00%] [N/A:N/A] % Reduction in Volume: 100.00% [N/A:N/A] Classification: [2:Full Thickness Without Exposed Support Structures] [N/A:N/A] Exudate Amount: [2:None Present] [N/A:N/A] Wound Margin: [2:Flat and Intact] [N/A:N/A] Granulation Amount: [2:None Present (0%)] [N/A:N/A] Necrotic Amount: [2:None Present (0%)] [N/A:N/A] Exposed Structures: [2:Fascia: No Fat: No Tendon: No Muscle: No Joint: No] [N/A:N/A] Bone: No Limited to Skin Breakdown Epithelialization: Large (67-100%) N/A N/A Periwound Skin Texture: Edema: Yes N/A N/A Excoriation: No Induration: No Callus: No Crepitus: No Fluctuance: No Friable: No Rash: No Scarring: No Periwound Skin Dry/Scaly: Yes N/A N/A Moisture: Maceration: No Moist: No Periwound Skin Color: Hemosiderin Staining: Yes N/A N/A Mottled: Yes Atrophie Blanche: No Cyanosis: No Ecchymosis:  No Erythema: No Pallor: No Rubor: No Temperature: No Abnormality N/A N/A Tenderness on Yes N/A N/A Palpation: Wound Preparation: Ulcer Cleansing: N/A N/A Rinsed/Irrigated with Saline, Other: surg scrub and water Topical Anesthetic Applied: None Treatment Notes Electronic Signature(s) Signed: 02/10/2016 10:33:54 AM By: [name] [name], RN Entered By: [name] on 02/10/2016 10:33:54 Watterson, [name] ([id number]) -------------------------------------------------------------------------------- [place] Details Copland, Date of Service: 02/10/2016 10:00 AM Patient Name: [name] Patient Account Number: [id number] Medical Record Treating RN: Baruch Gouty RN, [name], Velva Harman [id number] Number: Other Clinician: Date of Birth/Sex: 1951/01/27 (65 y.o. Male) Treating [name] Primary Care Physician/Extender: Tonye Pearson Physician: Referring Physician: Doristine Locks in Treatment: 5 Active Inactive Electronic Signature(s) Signed: 02/10/2016 10:33:40 AM By: [name] [name], RN Previous Signature: 02/10/2016 10:33:33 AM Version By: [name] [name], RN Entered By: [name] on 02/10/2016 10:33:40 Monaco, [name] ([id number]) -------------------------------------------------------------------------------- Pain Assessment Details Bergevin, Date of Service: 02/10/2016 10:00 AM Patient Name: [facility] Patient Account Number: [id number] Medical Record Treating RN: Baruch Gouty, RN, [name], Velva Harman [id number] Number: Other Clinician: Date of Birth/Sex: May 27, 1950 (65 y.o. Male) Treating [name], MICHAEL Primary Care Physician/Extender: Tonye Pearson Physician: Referring Physician: Doristine Locks in Treatment: 5 Active Problems Location of Pain Severity and Description of Pain Patient Has Paino No Site Locations Pain Management and Medication Current Pain Management: Electronic Signature(s) Signed: 02/10/2016 4:53:50 PM By: [name] [name], RN Entered By:  [name] on 02/10/2016 10:00:41 Mealey, [name] ([id number]) -------------------------------------------------------------------------------- Wound Assessment Details Beagle, Date of Service: 02/10/2016 10:00 AM Patient Name: [name] Patient Account Number: [id number] Medical Record Treating RN: Baruch Gouty, RN, [name], Velva Harman [id number] Number: Other Clinician: Date of Birth/Sex: [birth year] (65 y.o. Male) Treating [name], MICHAEL Primary Care Physician/Extender: Tonye Pearson Physician: Referring Physician: Doristine Locks in Treatment: 5 Wound Status Wound Number: 2 Primary Venous Leg Ulcer Etiology: Wound Location: Left Malleolus - Medial Wound Status: Healed - Epithelialized Wounding Event: Trauma Comorbid Arrhythmia, Hypertension, Gout, Date Acquired: 12/29/2015 History: Neuropathy Weeks Of Treatment: 5 Clustered Wound: No Photos Photo Uploaded By: [name] on 02/10/2016 10:42:48 Wound Measurements Length: (cm) 0 % Reduction in Width: (cm) 0 % Reduction in Depth: (cm) 0 Epithelializat Area: (cm) 0 Tunneling: Volume: (cm) 0 Undermining: Area: 100% Volume: 100% ion: Large (67-100%) No No Wound Description Full Thickness Without Exposed Classification: Support Structures Wound Margin: Flat and Intact Exudate None Present Amount: Foul Odor After Cleansing: No Wound Bed Granulation Amount: None Present (0%) Exposed Structure Necrotic Amount: None Present (0%) Fascia Exposed: No Fat Layer Exposed: No Tendon Exposed: No Skeens, Benancio ([id number]) Muscle Exposed:  No Joint Exposed: No Bone Exposed: No Limited to Skin Breakdown Periwound Skin Texture Texture Color No Abnormalities Noted: No No Abnormalities Noted: No Callus: No Atrophie Blanche: No Crepitus: No Cyanosis: No Excoriation: No Ecchymosis: No Fluctuance: No Erythema: No Friable: No Hemosiderin Staining: Yes Induration: No Mottled: Yes Localized Edema: Yes Pallor:  No Rash: No Rubor: No Scarring: No Temperature / Pain Moisture Temperature: No Abnormality No Abnormalities Noted: No Tenderness on Palpation: Yes Dry / Scaly: Yes Maceration: No Moist: No Wound Preparation Ulcer Cleansing: Rinsed/Irrigated with Saline, Other: surg scrub and water, Topical Anesthetic Applied: None Electronic Signature(s) Signed: 02/10/2016 4:53:50 PM By: [name] [name], RN Entered By: [name] on 02/10/2016 10:19:02 Hermida, [name] ([id number]) -------------------------------------------------------------------------------- Vitals Details Ludwick, Date of Service: 02/10/2016 10:00 AM Patient Name: [name] Patient Account Number: [id number] Medical Record Treating RN: Baruch Gouty, RN, [name], Velva Harman [id number] Number: Other Clinician: Date of Birth/Sex: 1950/06/24 (65 y.o. Male) Treating [name], MICHAEL Primary Care Physician/Extender: Tonye Pearson Physician: Referring Physician: Doristine Locks in Treatment: 5 Vital Signs Time Taken: 10:03 Temperature (F): 97.5 Height (in): 74 Pulse (bpm): 66 Weight (lbs): 240 Respiratory Rate (breaths/min): 19 Body Mass Index (BMI): 30.8 Blood Pressure (mmHg): 138/91 Reference Range: 80 - 120 mg / dl Electronic Signature(s) Signed: 02/10/2016 4:53:50 PM By: [name] [name], RN Entered By: [name] on 02/10/2016 10:03:50

## 2016-02-11 NOTE — Progress Notes (Signed)
[name] ([id number]) Visit Report for 02/10/2016 Chief Complaint Document Details [place], Date of Service: 02/10/2016 10:00 AM Patient Name: [name] Patient Account Number: [id number] Medical Record Treating RN: Baruch Gouty RN, [name], Velva Harman [id number] Number: Other Clinician: Date of Birth/Sex: 01/03/1951 (65 y.o. Male) Treating [name], [name] Primary Care Physician/Extender: Tonye Pearson Physician: Referring Physician: Doristine Locks in Treatment: 5 Information Obtained from: Patient Chief Complaint Patient presents for treatment of an open ulcer due to venous insufficiency. He says he noticed a "into the right medial ankle for about 3-4 weeks 01/06/16; patient reaper sensitive today for review of a wound over his left medial ankle there is been present for one week Electronic Signature(s) Signed: 02/11/2016 8:02:21 AM By: [name] MD Entered By: [name] on 02/10/2016 10:21:48 [name] ([id number]) -------------------------------------------------------------------------------- HPI Details [name], Date of Service: 02/10/2016 10:00 AM Patient Name: [name] Patient Account Number: [id number] Medical Record Treating RN: Baruch Gouty RN, [name], Velva Harman [id number] Number: Other Clinician: Date of Birth/Sex: [birth year] (65 y.o. Male) Treating [name] Primary Care Physician/Extender: Tonye Pearson Physician: Referring Physician: Doristine Locks in Treatment: 5 History of Present Illness Location: open wound just above his right ankle Quality: Patient reports experiencing a dull pain to affected area(s). Severity: Patient states wound are getting worse. Duration: Patient has had the wound for < 4 weeks prior to presenting for treatment Timing: Pain in wound is Intermittent (comes and goes Context: The wound appeared gradually over time Modifying Factors: Consults to this date include: he was seen in the ER and was referred to a  vascular surgeon but the patient has not done that. He may have been treated with clindamycin in the ER. Associated Signs and Symptoms: Patient reports having difficulty standing for long periods. HPI Description: 65 year old gentleman who was seen in the emergency department recently on 01/06/2015 for a wound of his right lower extremity which he says was not involving any injury and he did not know how he sustained it. He had draining foul-smelling liquid from the area and had gone for care there. his past medical history is significant for DVT, hypertension, gout, tobacco abuse, cocaine abuse, stroke, atrial fibrillation, pulmonary embolism. he has also had some vascular surgery with a stent placed in his leg. He has been a smoker for many years and has given up straight drugs several years ago. He continues to smoke about 4-5 cigarettes a day. 02/03/2015 -- received a note from 05/14/2013 where Dr. Leotis Pain placed an inferior vena cava filter. The patient had a deep vein thrombosis while therapeutic on anticoagulation for previous DVT and a IVC filter was placed for this. 02/10/2015 -- he did have his vascular test done on Friday but we have no reports yet. 02/17/2015 -- notes were reviewed from the vascular office and the patient had a venous ultrasound done which revealed that he had no reflux in the greater saphenous vein or the short saphenous vein bilaterally. He did have subacute DVT in the common femoral vein and popliteal veins on the right and left side. The recommendation was to continue with Unna's boot therapy at the wound clinic and then to wear graduated compression stockings once the ulcers healed and later if he had continuous problems lymphedema pump would benefit him. 03/17/2015 -- we have applied for his insurance and aide regarding cellular tissue-based products and are still awaiting the final clearance. 03/24/2015 -- he has had Apligraf authorized for him but his  wound is looking so good today that we may  not use it. 03/31/2015 -- he has not yet received his compression stockings though we have called a couple of times and hopefully they should arrive this week. [name] ([id number]) [place] 01/06/16; this is a patient we have previously cared for in this clinic with wounds on his right medial ankle. I was not previously involved with his care. 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 He wears compression stockings. He had reflux studies done during his last stay in this clinic which did not show significant reflux in the greater or lesser saphenous veins bilaterally. His history is that he developed a open sore on the left medial malleolus one week ago. He was seen in his primary physician office and given a course of doxycycline which he still should be on. Previously seen vascular surgery who felt that he had some degree of lymphedema as well. He is not a diabetic 01/13/16 no major change 01/20/16; very small wound on the medial right ankle again covered with surface slough that doesn't seem to be spotting the Prisma 01/27/16; patient comes in today complaining of a lot of pain around the wound site. He has not been systemically unwell. 02/03/16; the patient's wound culture last week grew Proteus, I had empirically given doxycycline. The Proteus was not specifically plated against doxycycline however Proteus itself was fairly pansensitive and the patient comes back feeling a lot better today. I think the doxycycline was likely to be successful in sufficient 02/10/16; as predicted last week the area has closed over. These are probably venous insufficiency wounds although his previous reflux studies did not show superficial reflux. He also has a history of DVT and at one time had a Greenfield filter in place. The area in question on his left  medial ankle region. It became secondarily infected but responded nicely to antibiotics. He is closed Electrical engineer) Signed: 02/11/2016 8:02:21 AM By: [name] MD Entered By: [name] on 02/10/2016 10:24:16 Brand, [name] ([id number]) -------------------------------------------------------------------------------- Physical Exam Details [place], Date of Service: 02/10/2016 10:00 AM Patient Name: [facility] Patient Account Number: [id number] Medical Record Treating RN: Baruch Gouty RN, [name], Velva Harman [id number] Number: Other Clinician: Date of Birth/Sex: [birth year] (65 y.o. Male) Treating [name], Tatum Corl Primary Care Physician/Extender: Tonye Pearson Physician: Referring Physician: Doristine Locks in Treatment: 5 Constitutional Patient is hypertensive.. Pulse regular and within target range for patient.[name] Respirations regular, non-labored and within target range.. Temperature is normal and within the target range for the patient.. Patient's appearance is neat and clean. Appears in no acute distress. Well nourished and well developed.. Cardiovascular Pedal pulses palpable and strong bilaterally.. Edema present in both extremities. Edema is mild. Changes of chronic hemosiderin deposition. Notes Wound exam; the area is completely epithelialized and closed. This however continues to looks somewhat vulnerable. There is no evidence of surrounding infection. Dorsalis pedis pulses easily felt Electronic Signature(s) Signed: 02/11/2016 8:02:21 AM By: [name] MD Entered By: [name] on 02/10/2016 10:26:27 Cullins, [name] ([id number]) -------------------------------------------------------------------------------- Physician Orders Details [place], Date of Service: 02/10/2016 10:00 AM Patient Name: [name] Patient Account Number: [id number] Medical Record Treating RN: Baruch Gouty RN, [name], Velva Harman [id number] Number: Other Clinician: Date of Birth/Sex:  1951-05-16 (65 y.o. Male) Treating Brodrick Curran Primary Care Physician/Extender: Tonye Pearson Physician: Referring Physician: Doristine Locks in Treatment: 5 Verbal / Phone Orders: Yes Clinician: Afful, RN, [name], Rita Read Back and Verified: Yes  Diagnosis Coding Discharge From [facility] o Discharge from [place] Completed Electronic Signature(s) Signed: 02/10/2016 4:53:50 PM By: [name] [name], RN Signed: 02/11/2016 8:02:21 AM By: [name] MD Entered By: [name] on 02/10/2016 10:19:29 Mccarley, [name] ([id number]) -------------------------------------------------------------------------------- Problem List Details [place], Date of Service: 02/10/2016 10:00 AM Patient Name: [name] Patient Account Number: [id number] Medical Record Treating RN: Baruch Gouty RN, [name], Velva Harman [id number] Number: Other Clinician: Date of Birth/Sex: Aug 01, 1950 (65 y.o. 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/06/2016 Yes breakdown of skin I87.332 Chronic venous hypertension (idiopathic) with ulcer and 01/06/2016 Yes inflammation of left lower extremity Inactive Problems Resolved Problems Electronic Signature(s) Signed: 02/11/2016 8:02:21 AM By: [name] MD Entered By: [name] on 02/10/2016 10:21:18 Lourenco, [name] ([id number]) -------------------------------------------------------------------------------- Progress Note Details [place], Date of Service: 02/10/2016 10:00 AM Patient Name: [name] Patient Account Number: [id number] Medical Record Treating RN: Baruch Gouty RN, [name], Velva Harman [id number] Number: Other Clinician: Date of Birth/Sex: December 12, 1950 (65 y.o. Male) Treating [name], Addilynn Mowrer Primary Care  Physician/Extender: Tonye Pearson Physician: Referring Physician: Doristine Locks in Treatment: 5 Subjective Chief Complaint Information obtained from Patient Patient presents for treatment of an open ulcer due to venous insufficiency. He says he noticed a "into the right medial ankle for about 3-4 weeks 01/06/16; patient reaper sensitive today for review of a wound over his left medial ankle there is been present for one week History of Present Illness (HPI) The following HPI elements were documented for the patient's wound: Location: open wound just above his right ankle Quality: Patient reports experiencing a dull pain to affected area(s). Severity: Patient states wound are getting worse. Duration: Patient has had the wound for < 4 weeks prior to presenting for treatment Timing: Pain in wound is Intermittent (comes and goes Context: The wound appeared gradually over time Modifying Factors: Consults to this date include: he was seen in the ER and was referred to a vascular surgeon but the patient has not done that. He may have been treated with clindamycin in the ER. Associated Signs and Symptoms: Patient reports having difficulty standing for long periods. 65 year old gentleman who was seen in the emergency department recently on 01/06/2015 for a wound of his right lower extremity which he says was not involving any injury and he did not know how he sustained it. He had draining foul-smelling liquid from the area and had gone for care there. his past medical history is significant for DVT, hypertension, gout, tobacco abuse, cocaine abuse, stroke, atrial fibrillation, pulmonary embolism. he has also had some vascular surgery with a stent placed in his leg. He has been a smoker for many years and has given up straight drugs several years ago. He continues to smoke about 4-5 cigarettes a day. 02/03/2015 -- received a note from 05/14/2013 where Dr. Leotis Pain placed an inferior  vena cava filter. The patient had a deep vein thrombosis while therapeutic on anticoagulation for previous DVT and a IVC filter was placed for this. 02/10/2015 -- he did have his vascular test done on Friday but we have no reports yet. 02/17/2015 -- notes were reviewed from the vascular office and the patient had a venous ultrasound done which revealed that he had no reflux in the greater saphenous vein or the short saphenous vein bilaterally. He did have subacute DVT in  the common femoral vein and popliteal veins on the right and left side. The recommendation was to continue with Unna's boot therapy at the wound clinic and then to wear graduated Twardowski, Jakell ([id number]) compression stockings once the ulcers healed and later if he had continuous problems lymphedema pump would benefit him. 03/17/2015 -- we have applied for his insurance and aide regarding cellular tissue-based products and are still awaiting the final clearance. 03/24/2015 -- he has had Apligraf authorized for him but his wound is looking so good today that we may not use it. 03/31/2015 -- he has not yet received his compression stockings though we have called a couple of times and hopefully they should arrive this week. READMISSION 01/06/16; this is a patient we have previously cared for in this clinic with wounds on his right medial ankle. I was not previously involved with his care. 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 He wears compression stockings. He had reflux studies done during his last stay in this clinic which did not show significant reflux in the greater or lesser saphenous veins bilaterally. His history is that he developed a open sore on the left medial malleolus one week ago. He was seen in his primary physician office and given a course of doxycycline which he still should be on. Previously seen vascular  surgery who felt that he had some degree of lymphedema as well. He is not a diabetic 01/13/16 no major change 01/20/16; very small wound on the medial right ankle again covered with surface slough that doesn't seem to be spotting the Prisma 01/27/16; patient comes in today complaining of a lot of pain around the wound site. He has not been systemically unwell. 02/03/16; the patient's wound culture last week grew Proteus, I had empirically given doxycycline. The Proteus was not specifically plated against doxycycline however Proteus itself was fairly pansensitive and the patient comes back feeling a lot better today. I think the doxycycline was likely to be successful in sufficient 02/10/16; as predicted last week the area has closed over. These are probably venous insufficiency wounds although his previous reflux studies did not show superficial reflux. He also has a history of DVT and at one time had a Greenfield filter in place. The area in question on his left medial ankle region. It became secondarily infected but responded nicely to antibiotics. He is closed today Objective Constitutional Patient is hypertensive.. Pulse regular and within target range for patient.[name] Respirations regular, non-labored and within target range.. Temperature is normal and within the target range for the patient.. Patient's appearance is neat and clean. Appears in no acute distress. Well nourished and well developed.. Vitals Time Taken: 10:03 AM, Height: 74 in, Weight: 240 lbs, BMI: 30.8, Temperature: 97.5 F, Pulse: 66 bpm, Respiratory Rate: 19 breaths/min, Blood Pressure: 138/91 mmHg. Cardiovascular Pedal pulses palpable and strong bilaterally.. Edema present in both extremities. Edema is mild. Changes Deringer, [name] ([id number]) of chronic hemosiderin deposition. General Notes: Wound exam; the area is completely epithelialized and closed. This however continues to looks somewhat vulnerable. There is no  evidence of surrounding infection. Dorsalis pedis pulses easily felt Integumentary (Hair, Skin) Wound #2 status is Healed - Epithelialized. Original cause of wound was Trauma. The wound is located on the Left,Medial Malleolus. The wound measures 0cm length x 0cm width x 0cm depth; 0cm^2 area and 0cm^3 volume. The wound is  limited to skin breakdown. There is no tunneling or undermining noted. There is a none present amount of drainage noted. The wound margin is flat and intact. There is no granulation within the wound bed. There is no necrotic tissue within the wound bed. The periwound skin appearance exhibited: Localized Edema, Dry/Scaly, Hemosiderin Staining, Mottled. The periwound skin appearance did not exhibit: Callus, Crepitus, Excoriation, Fluctuance, Friable, Induration, Rash, Scarring, Maceration, Moist, Atrophie Blanche, Cyanosis, Ecchymosis, Pallor, Rubor, Erythema. Periwound temperature was noted as No Abnormality. The periwound has tenderness on palpation. Assessment Active Problems ICD-10 P79.480 - Non-pressure chronic ulcer of left ankle limited to breakdown of skin I87.332 - Chronic venous hypertension (idiopathic) with ulcer and inflammation of left lower extremity Plan Discharge From [facility]: Discharge from [place] Completed o The patient's wound is completely healed. I think this is largely venous insufficiency probably deep venous reflux given his history of DVT and a Greenfield filter. The wound this time became secondarily infected Rodak, Joziyah ([id number]) which delayed healing responded to doxycycline. He states he has his own compression stockings at home we advised using them during the day. Also advised to keep the skin lubricated in this area Electronic Signature(s) Signed: 02/11/2016 8:02:21 AM By: [name] MD Entered By: [name] on 02/10/2016 10:29:25 Erny, [name]  ([id number]) -------------------------------------------------------------------------------- SuperBill Details [place], Date of Service: 02/10/2016 Patient Name: [name] Patient Account Number: [id number] Medical Record Treating RN: Baruch Gouty RN, [name], Velva Harman [id number] Number: Other Clinician: Date of Birth/Sex: 05-Sep-1950 (65 y.o. Male) Treating Algie Westry Primary Care Physician/Extender: Tonye Pearson Physician: Suella [place] in Treatment: 5 Referring Physician: Royetta Crochet Diagnosis Coding ICD-10 Codes Code Description R97.588 Non-pressure chronic ulcer of left ankle limited to breakdown of skin Chronic venous hypertension (idiopathic) with ulcer and inflammation of left lower I87.332 extremity Facility Procedures CPT4 Code: [id number] Description: 41583 - WOUND CARE VISIT-LEV 2 EST PT Modifier: Quantity: 1 Physician Procedures CPT4 Code Description: [id number] - WC PHYS LEVEL 2 - EST PT ICD-10 Description Diagnosis L97.321 Non-pressure chronic ulcer of left ankle limited to Modifier: breakdown of Quantity: 1 skin Electronic Signature(s) Signed: 02/10/2016 10:35:52 AM By: [name] [name], RN Signed: 02/11/2016 8:02:21 AM By: [name] MD Entered By: [name] on 02/10/2016 10:35:51

## 2016-02-17 ENCOUNTER — Encounter: Payer: Medicare Other | Attending: Physician Assistant | Admitting: Physician Assistant

## 2016-02-17 DIAGNOSIS — Z8673 Personal history of transient ischemic attack (TIA), and cerebral infarction without residual deficits: Secondary | ICD-10-CM | POA: Diagnosis not present

## 2016-02-17 DIAGNOSIS — I4891 Unspecified atrial fibrillation: Secondary | ICD-10-CM | POA: Insufficient documentation

## 2016-02-17 DIAGNOSIS — I10 Essential (primary) hypertension: Secondary | ICD-10-CM | POA: Diagnosis not present

## 2016-02-17 DIAGNOSIS — L97321 Non-pressure chronic ulcer of left ankle limited to breakdown of skin: Secondary | ICD-10-CM | POA: Diagnosis not present

## 2016-02-17 DIAGNOSIS — F17210 Nicotine dependence, cigarettes, uncomplicated: Secondary | ICD-10-CM | POA: Insufficient documentation

## 2016-02-17 DIAGNOSIS — I87332 Chronic venous hypertension (idiopathic) with ulcer and inflammation of left lower extremity: Secondary | ICD-10-CM | POA: Diagnosis present

## 2016-02-17 DIAGNOSIS — M109 Gout, unspecified: Secondary | ICD-10-CM | POA: Diagnosis not present

## 2016-02-17 DIAGNOSIS — Z7901 Long term (current) use of anticoagulants: Secondary | ICD-10-CM | POA: Diagnosis not present

## 2016-02-17 DIAGNOSIS — Z86718 Personal history of other venous thrombosis and embolism: Secondary | ICD-10-CM | POA: Diagnosis not present

## 2016-02-20 NOTE — Progress Notes (Addendum)
[name], [name] Visit Report for 02/17/2016 Chief Complaint Document Details Patient Name: [name], [name] Date of Service: 02/17/2016 3:15 PM Medical Record Number: [id number] Patient Account Number: [id number] Date of Birth/Sex: Jul 06, 1950 (65 y.o. Male) Treating RN: [name] Primary Care Physician: [name] Other Clinician: Referring Physician: Royetta [name] Treating Physician/Extender: [name], [name] in Treatment: 6 Information Obtained from: Patient Chief Complaint Patient follows up today due to reopening of the left medial venous ulcer Electronic Signature(s) Signed: 02/19/2016 1:37:32 AM By: Mark Keeler PA-C Entered By: [name] on 02/17/2016 17:05:46 [name], [name] -------------------------------------------------------------------------------- Debridement Details Patient Name: [name] Date of Service: 02/17/2016 3:15 PM Medical Record Number: [id number] Patient Account Number: [id number] Date of Birth/Sex: 10/30/1950 (65 y.o. Male) Treating RN: [name] Primary Care Physician: [name] Other Clinician: Referring Physician: Royetta [name] Treating Physician/Extender: [name], [name] in Treatment: 6 Debridement Performed for Wound #3 Right Lower Leg Assessment: Performed By: Physician [name], Mark, Debridement: Debridement Pre-procedure Yes - 16:22 Verification/Time Out Taken: Start Time: 16:23 [name] Control: Other : lidociane 4% Level: Skin/Subcutaneous Tissue Total Area Debrided (L x 5.8 (cm) x 1.2 (cm) = 6.96 (cm) W): Tissue and other Viable, Non-Viable, Eschar, Fibrin/Slough, Subcutaneous material debrided: Instrument: Curette Bleeding: Minimum Hemostasis Achieved: Pressure End Time: 16:25 Procedural [name]: 0 Post Procedural [name]: 0 Response to Treatment: Procedure was tolerated well Post Debridement Measurements of Total Wound Length: (cm) 5.8 Width: (cm) 1.2 Depth:  (cm) 0.1 Volume: (cm) 0.547 Character of Wound/Ulcer Post Requires Further Debridement Debridement: Severity of Tissue Post Debridement: Fat layer exposed Post Procedure Diagnosis Same as Pre-procedure Electronic Signature(s) Signed: 02/19/2016 1:37:32 AM By: Mark Keeler PA-C Signed: 02/19/2016 4:28:52 PM By: Mark Cool, RN, [name], [name], [name] Entered By: Mark Cool, RN, [name], [name] on 02/17/2016 16:24:26 Surgeon, [name] ([id number]) [name], [name] ([id number]) -------------------------------------------------------------------------------- HPI Details Patient Name: [name] Date of Service: 02/17/2016 3:15 PM Medical Record Number: [id number] Patient Account Number: [id number] Date of Birth/Sex: 05/01/1951 (65 y.o. Male) Treating RN: [name] Primary Care Physician: [name] Other Clinician: Referring Physician: Royetta [name] Treating Physician/Extender: [name], [name] in Treatment: 6 History of Present Illness Location: open wound just above his right ankle medially Quality: Patient tells me he is not having a significant amount of [name] at this point in time. Severity: 1 out of 10 Duration: This just reopened in the past week. Timing: [name] in wound is Intermittent Context: The wound appeared gradually over time Modifying Factors: Consults to this date include: he was seen in the ER and was referred to a vascular surgeon but the patient has not done that. He may have been treated with clindamycin in the ER. Associated Signs and Symptoms: Patient reports having difficulty standing for long periods. HPI Description: 65 year old gentleman who was seen in the emergency department recently on 01/06/2015 for a wound of his right lower extremity which he says was not involving any injury and he did not know how he sustained it. He had draining foul-smelling liquid from the area and had gone for care there. his past medical history is significant for DVT,  hypertension, gout, tobacco abuse, cocaine abuse, stroke, atrial fibrillation, pulmonary embolism. he has also had some vascular surgery with a stent placed in his leg. He has been a smoker for many years and has given up straight drugs several years ago. He continues to smoke about 4-5 cigarettes a day. 02/03/2015 -- received a note from 05/14/2013 where Dr. Leotis [name] placed an inferior  vena cava filter. The patient had a deep vein thrombosis while therapeutic on anticoagulation for previous DVT and a IVC filter was placed for this. 02/10/2015 -- he did have his vascular test done on Friday but we have no reports yet. 02/17/2015 -- notes were reviewed from the vascular office and the patient had a venous ultrasound done which revealed that he had no reflux in the greater saphenous vein or the short saphenous vein bilaterally. He did have subacute DVT in the common femoral vein and popliteal veins on the right and left side. The recommendation was to continue with Unna's boot therapy at the wound clinic and then to wear graduated compression stockings once the ulcers healed and later if he had continuous problems lymphedema pump would benefit him. 03/17/2015 -- we have applied for his insurance and aide regarding cellular tissue-based products and are still awaiting the final clearance. 03/24/2015 -- he has had Apligraf authorized for him but his wound is looking so good today that we may not use it. 03/31/2015 -- he has not yet received his compression stockings though we have called a couple of times and hopefully they should arrive this week. READMISSION 01/06/16; this is a patient we have previously cared for in this clinic with wounds on his right medial ankle. I was not previously involved with his care. He has a history of DVT and is on chronic Coumadin and one [name] ([id number]) point had an inferior vena cava filter I'm not sure if that is still in place. He wears  compression stockings. He had reflux studies done during his last stay in this clinic which did not show significant reflux in the greater or lesser saphenous veins bilaterally. His history is that he developed a open sore on the left medial malleolus one week ago. He was seen in his primary physician office and given a course of doxycycline which he still should be on. Previously seen vascular surgery who felt that he had some degree of lymphedema as well. He is not a diabetic 01/13/16 no major change 01/20/16; very small wound on the medial right ankle again covered with surface slough that doesn't seem to be spotting the Prisma 01/27/16; patient comes in today complaining of a lot of [name] around the wound site. He has not been systemically unwell. 02/03/16; the patient's wound culture last week grew Proteus, I had empirically given doxycycline. The Proteus was not specifically plated against doxycycline however Proteus itself was fairly pansensitive and the patient comes back feeling a lot better today. I think the doxycycline was likely to be successful in sufficient 02/10/16; as predicted last week the area has closed over. These are probably venous insufficiency wounds although his previous reflux studies did not show superficial reflux. He also has a history of DVT and at one time had a Greenfield filter in place. The area in question on his left medial ankle region. It became secondarily infected but responded nicely to antibiotics. He is closed today 02/17/16 unfortunately patient's venous wound on the medial aspect of his right ankle at this point in time has reopened. He has been using some compression hose which appear to be very light that he purchased he tells me out of a magazine. He seems a little frustrated with the fact that this has reopened and is concerned about his left lower extremity possibly reopening as well. Electronic Signature(s) Signed: 02/19/2016 1:37:32 AM By: Mark Keeler PA-C Entered By: [name] on 02/17/2016 17:09:43 [name],  [name] ([id number]) -------------------------------------------------------------------------------- Physical Exam Details Patient Name: [name], [name] Date of Service: 02/17/2016 3:15 PM Medical Record Number: [id number] Patient Account Number: [id number] Date of Birth/Sex: July 15, 1950 (65 y.o. Male) Treating RN: [name] Primary Care Physician: [name] Other Clinician: Referring Physician: Royetta [name] Treating Physician/Extender: [name], [name] in Treatment: 6 Constitutional Well-nourished and well-hydrated in no acute distress. Respiratory normal breathing without difficulty. clear to auscultation bilaterally. Cardiovascular regular rate and rhythm with normal S1, S2. bilateral lower extremity 1+ pitting edema. Psychiatric this patient is able to make decisions and demonstrates good insight into disease process. Alert and Oriented x 3. pleasant and cooperative. Electronic Signature(s) Signed: 02/19/2016 1:37:32 AM By: Mark Keeler PA-C Entered By: [name] on 02/17/2016 17:10:45 [name], [name] ([id number]) -------------------------------------------------------------------------------- Physician Orders Details Patient Name: [name] Date of Service: 02/17/2016 3:15 PM Medical Record Number: [id number] Patient Account Number: [id number] Date of Birth/Sex: [birth year] (65 y.o. Male) Treating RN: [name] Primary Care Physician: [name] Other Clinician: Referring Physician: Royetta [name] Treating Physician/Extender: Sharalyn Ink in Treatment: 6 Verbal / Phone Orders: Yes Clinician: Cornell [name] Read Back and Verified: Yes Diagnosis Coding Wound Cleansing Wound #3 Right Lower Leg o Clean wound with Normal Saline. Anesthetic Wound #3 Right Lower Leg o Topical Lidocaine 4% cream applied to wound bed prior to debridement Skin  Barriers/Peri-Wound Care Wound #3 Right Lower Leg o Barrier cream Primary Wound Dressing Wound #3 Right Lower Leg o Aquacel Ag Secondary Dressing Wound #3 Right Lower Leg o ABD pad Dressing Change Frequency Wound #3 Right Lower Leg o Change dressing every week Follow-up Appointments Wound #3 Right Lower Leg o Return Appointment in 1 week. Edema Control Wound #3 Right Lower Leg o 4-Layer Compression System - Right Lower Extremity Additional Orders / Instructions Wound #3 Right Lower Leg [name], [name] ([id number]) o Increase protein intake. Electronic Signature(s) Signed: 02/19/2016 1:37:32 AM By: Mark Keeler PA-C Signed: 02/19/2016 4:28:52 PM By: Mark Cool RN, [name], [name], [name] Entered By: Mark Cool, RN, [name], [name] on 02/17/2016 16:22:59 Ayer, [name] ([id number]) -------------------------------------------------------------------------------- Problem List Details Patient Name: [name] Date of Service: 02/17/2016 3:15 PM Medical Record Number: [id number] Patient Account Number: [id number] Date of Birth/Sex: 12/09/1950 (65 y.o. 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/06/2016 Yes breakdown of skin I87.332 Chronic venous hypertension (idiopathic) with ulcer and 01/06/2016 Yes inflammation of left lower extremity Inactive Problems Resolved Problems Electronic Signature(s) Signed: 02/19/2016 1:37:32 AM By: Mark Keeler PA-C Entered By: [name] on 02/17/2016 17:04:35 [name], [name] ([id number]) -------------------------------------------------------------------------------- Progress Note Details Patient Name: [name] Date of Service:  02/17/2016 3:15 PM Medical Record Number: [id number] Patient Account Number: [id number] Date of Birth/Sex: 02-07-1951 (66 y.o. Male) Treating RN: [name] Primary Care Physician: [name] Other Clinician: Referring Physician: Royetta [name] Treating Physician/Extender: [name], [name] in Treatment: 6 Subjective Chief Complaint Information obtained from Patient Patient follows up today due to reopening of the left medial venous ulcer History of Present Illness (HPI) The following HPI elements were documented for the patient's wound: Location: open wound just above his right ankle medially Quality: Patient tells me he is not having a significant amount of [name] at this point in time. Severity: 1 out of 10 Duration: This just reopened in the past  week. Timing: [name] in wound is Intermittent Context: The wound appeared gradually over time Modifying Factors: Consults to this date include: he was seen in the ER and was referred to a vascular surgeon but the patient has not done that. He may have been treated with clindamycin in the ER. Associated Signs and Symptoms: Patient reports having difficulty standing for long periods. 65 year old gentleman who was seen in the emergency department recently on 01/06/2015 for a wound of his right lower extremity which he says was not involving any injury and he did not know how he sustained it. He had draining foul-smelling liquid from the area and had gone for care there. his past medical history is significant for DVT, hypertension, gout, tobacco abuse, cocaine abuse, stroke, atrial fibrillation, pulmonary embolism. he has also had some vascular surgery with a stent placed in his leg. He has been a smoker for many years and has given up straight drugs several years ago. He continues to smoke about 4-5 cigarettes a day. 02/03/2015 -- received a note from 05/14/2013 where Dr. Leotis [name] placed an inferior vena cava filter. The patient had a  deep vein thrombosis while therapeutic on anticoagulation for previous DVT and a IVC filter was placed for this. 02/10/2015 -- he did have his vascular test done on Friday but we have no reports yet. 02/17/2015 -- notes were reviewed from the vascular office and the patient had a venous ultrasound done which revealed that he had no reflux in the greater saphenous vein or the short saphenous vein bilaterally. He did have subacute DVT in the common femoral vein and popliteal veins on the right and left side. The recommendation was to continue with Unna's boot therapy at the wound clinic and then to wear graduated compression stockings once the ulcers healed and later if he had continuous problems lymphedema pump would benefit him. 03/17/2015 -- we have applied for his insurance and aide regarding cellular tissue-based products and are still awaiting the final clearance. 03/24/2015 -- he has had Apligraf authorized for him but his wound is looking so good today that we may Durell, Ostin ([id number]) not use it. 03/31/2015 -- he has not yet received his compression stockings though we have called a couple of times and hopefully they should arrive this week. READMISSION 01/06/16; this is a patient we have previously cared for in this clinic with wounds on his right medial ankle. I was not previously involved with his care. 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 He wears compression stockings. He had reflux studies done during his last stay in this clinic which did not show significant reflux in the greater or lesser saphenous veins bilaterally. His history is that he developed a open sore on the left medial malleolus one week ago. He was seen in his primary physician office and given a course of doxycycline which he still should be on. Previously seen vascular surgery who felt that he had some  degree of lymphedema as well. He is not a diabetic 01/13/16 no major change 01/20/16; very small wound on the medial right ankle again covered with surface slough that doesn't seem to be spotting the Prisma 01/27/16; patient comes in today complaining of a lot of [name] around the wound site. He has not been systemically unwell. 02/03/16; the patient's wound culture last week grew Proteus, I had empirically given doxycycline. The  Proteus was not specifically plated against doxycycline however Proteus itself was fairly pansensitive and the patient comes back feeling a lot better today. I think the doxycycline was likely to be successful in sufficient 02/10/16; as predicted last week the area has closed over. These are probably venous insufficiency wounds although his previous reflux studies did not show superficial reflux. He also has a history of DVT and at one time had a Greenfield filter in place. The area in question on his left medial ankle region. It became secondarily infected but responded nicely to antibiotics. He is closed today 02/17/16 unfortunately patient's venous wound on the medial aspect of his right ankle at this point in time has reopened. He has been using some compression hose which appear to be very light that he purchased he tells me out of a magazine. He seems a little frustrated with the fact that this has reopened and is concerned about his left lower extremity possibly reopening as well. Objective Constitutional Well-nourished and well-hydrated in no acute distress. Vitals Time Taken: 3:31 PM, Height: 74 in, Weight: 240 lbs, BMI: 30.8, Temperature: 98.1 F, Pulse: 75 bpm, Respiratory Rate: 18 breaths/min, Blood Pressure: 133/73 mmHg. Respiratory normal breathing without difficulty. clear to auscultation bilaterally. Cardiovascular [name], [name] ([id number]) regular rate and rhythm with normal S1, S2. bilateral lower extremity 1+ pitting edema. Psychiatric this  patient is able to make decisions and demonstrates good insight into disease process. Alert and Oriented x 3. pleasant and cooperative. Integumentary (Hair, Skin) Wound #3 status is Open. Original cause of wound was Gradually Appeared. The wound is located on the Right Lower Leg. The wound measures 5.8cm length x 1.2cm width x 0.1cm depth; 5.466cm^2 area and 0.547cm^3 volume. The wound is limited to skin breakdown. There is no tunneling noted. There is a medium amount of serosanguineous drainage noted. The wound margin is flat and intact. There is medium (34-66%) red granulation within the wound bed. There is a medium (34-66%) amount of necrotic tissue within the wound bed including Adherent Slough. The periwound skin appearance exhibited: Induration, Scarring. The periwound skin appearance did not exhibit: Callus, Crepitus, Excoriation, Fluctuance, Friable, Localized Edema, Rash, Dry/Scaly, Maceration, Moist, Atrophie Blanche, Cyanosis, Ecchymosis, Hemosiderin Staining, Mottled, Pallor, Rubor, Erythema. Assessment Active Problems ICD-10 D74.128 - Non-pressure chronic ulcer of left ankle limited to breakdown of skin I87.332 - Chronic venous hypertension (idiopathic) with ulcer and inflammation of left lower extremity Diagnoses ICD-10 L97.321: Non-pressure chronic ulcer of left ankle limited to breakdown of skin I87.332: Chronic venous hypertension (idiopathic) with ulcer and inflammation of left lower extremity Procedures Wound #3 Wound #3 is a Venous Leg Ulcer located on the Right Lower Leg . There was a Skin/Subcutaneous Tissue Debridement ([id number]) debridement with total area of 6.96 sq cm performed by [name], Mark. with the following instrument(s): Curette to remove Viable and Non-Viable tissue/material including Fibrin/Slough, Eschar, and Subcutaneous after achieving [name] control using Other (lidociane 4%). A time out was conducted at 16:22, prior to the start of the procedure.  A Minimum amount of bleeding was controlled with Pressure. The procedure was tolerated well with a [name] level of 0 throughout and a [name] level of 0 following the procedure. Post Debridement Measurements: 5.8cm length x 1.2cm width x 0.1cm depth; [name], [name] ([id number]) 0.547cm^3 volume. Character of Wound/Ulcer Post Debridement requires further debridement. Severity of Tissue Post Debridement is: Fat layer exposed. Post procedure Diagnosis Wound #3: Same as Pre-Procedure Plan Wound Cleansing: Wound #3 Right Lower Leg: Clean wound with Normal  Saline. Anesthetic: Wound #3 Right Lower Leg: Topical Lidocaine 4% cream applied to wound bed prior to debridement Skin Barriers/Peri-Wound Care: Wound #3 Right Lower Leg: Barrier cream Primary Wound Dressing: Wound #3 Right Lower Leg: Aquacel Ag Secondary Dressing: Wound #3 Right Lower Leg: ABD pad Dressing Change Frequency: Wound #3 Right Lower Leg: Change dressing every week Follow-up Appointments: Wound #3 Right Lower Leg: Return Appointment in 1 week. Edema Control: Wound #3 Right Lower Leg: 4-Layer Compression System - Right Lower Extremity Additional Orders / Instructions: Wound #3 Right Lower Leg: Increase protein intake. Follow-Up Appointments: A follow-up appointment should be scheduled. Medication Reconciliation completed and provided to Patient/Care Provider. A Patient Clinical Summary of Care was provided to [name], [name] ([id number]) Currently on evaluation today patient was experiencing a reopening of the medial aspect venous ulcer that he was recently healed of. he continues to have swelling of his bilateral lower extremities and has in the past responded very well to 4-layer compression wraps. Unfortunately he does not seem to have compression hose that are sufficient to control his swelling at this point which I think is part of the issue at this point in time. I did perform sharp debridement using a  curette to clean up the wound over the medial aspect of the right ankle which patient tolerated today without complication. I'm going to recommend Aquacel Ag which he has responded well in the past to. I'm also recommending a 4-layer compression wrap. All questions and concerns were answered to the best of my ability today and we will see him back for a follow-up visit in one week. Electronic Signature(s) Signed: 03/10/2016 4:41:33 PM By: Mark Keeler PA-C Previous Signature: 02/19/2016 1:37:32 AM Version By: Mark Keeler PA-C Entered By: [name] on 03/10/2016 16:41:32 [name], [name] ([id number]) -------------------------------------------------------------------------------- SuperBill Details Patient Name: [name] Date of Service: 02/17/2016 Medical Record Number: [id number] Patient Account Number: [id number] Date of Birth/Sex: 06-03-1950 (65 y.o. Male) Treating RN: [name] Primary Care Physician: [name] Other Clinician: Referring Physician: Royetta [name] Treating Physician/Extender: [name], [name] in Treatment: 6 Diagnosis Coding ICD-10 Codes Code Description F00.712 Non-pressure chronic ulcer of left ankle limited to breakdown of skin Chronic venous hypertension (idiopathic) with ulcer and inflammation of left lower I87.332 extremity Facility Procedures CPT4: Description Modifier Quantity Code 19758832 99212 - WOUND CARE VISIT-LEV 2 EST PT 1 CPT4: 54982641 11042 - DEB SUBQ TISSUE 20 SQ CM/< 1 ICD-10 Description Diagnosis L97.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

## 2016-02-20 NOTE — Progress Notes (Signed)
TAISHAUN, LEVELS ([id number]) Visit Report for 02/17/2016 Arrival Information Details Patient Name: [name], [name] Date of Service: 02/17/2016 3:15 PM Medical Record Number: [id number] Patient Account Number: [id number] Date of Birth/Sex: Jul 15, 1950 (65 y.o. Male) Treating RN: Cornell Barman Primary Care Physician: Royetta Crochet Other Clinician: Referring Physician: Royetta Crochet Treating Physician/Extender: Melburn Hake, HOYT Weeks in Treatment: 6 Visit Information History Since Last Visit Added or deleted any medications: No Patient Arrived: Ambulatory Any new allergies or adverse reactions: No Arrival Time: 15:30 Had a fall or experienced change in No Accompanied By: self activities of daily living that may affect Transfer Assistance: None risk of falls: Patient Identification Verified: Yes Signs or symptoms of abuse/neglect since last No Secondary Verification Process Yes visito Completed: Hospitalized since last visit: No Patient Requires Transmission- No Pain Present Now: No Based Precautions: Patient Has Alerts: Yes Patient Alerts: Patient on Blood Thinner warfarin Electronic Signature(s) Signed: 02/19/2016 4:28:52 PM By: Gretta Cool, RN, [name], [name], [name] Entered By: Gretta Cool, RN, [name], Kim on 02/17/2016 15:30:35 Sneeringer, [name] ([id number]) -------------------------------------------------------------------------------- Clinic Level of Care Assessment Details Patient Name: [name] Date of Service: 02/17/2016 3:15 PM Medical Record Number: [id number] Patient Account Number: [id number] Date of Birth/Sex: 04-23-1951 (65 y.o. Male) Treating RN: Cornell Barman Primary Care Physician: Royetta Crochet Other Clinician: Referring Physician: Royetta Crochet Treating Physician/Extender: Melburn Hake, HOYT Weeks in Treatment: 6 Clinic Level of Care Assessment Items TOOL 3 Quantity Score '[]'$  - Use when EandM and Procedure is performed on FOLLOW-UP visit 0 ASSESSMENTS - Nursing  Assessment / Reassessment '[]'$  - Reassessment of Co-morbidities (includes updates in patient status) 0 X - Reassessment of Adherence to Treatment Plan 1 5 ASSESSMENTS - Wound and Skin Assessment / Reassessment '[]'$  - Points for Wound Assessment can only be taken for a new wound of unknown 0 or different etiology and a procedure is NOT performed to that wound X - Simple Wound Assessment / Reassessment - one wound 1 5 '[]'$  - Complex Wound Assessment / Reassessment - multiple wounds 0 '[]'$  - Dermatologic / Skin Assessment (not related to wound area) 0 ASSESSMENTS - Focused Assessment '[]'$  - Circumferential Edema Measurements - multi extremities 0 '[]'$  - Nutritional Assessment / Counseling / Intervention 0 '[]'$  - Lower Extremity Assessment (monofilament, tuning fork, pulses) 0 '[]'$  - Peripheral Arterial Disease Assessment (using hand held doppler) 0 ASSESSMENTS - Ostomy and/or Continence Assessment and Care '[]'$  - Incontinence Assessment and Management 0 '[]'$  - Ostomy Care Assessment and Management (repouching, etc.) 0 PROCESS - Coordination of Care '[]'$  - Points for Discharge Coordination can only be taken for a new wound of 0 unknown or different etiology and a procedure is NOT performed to that wound X - Simple Patient / Family Education for ongoing care 1 15 '[]'$  - Complex (extensive) Patient / Family Education for ongoing care 0 ASTIN, SAYRE ([id number]) X - Staff obtains Consents, Records, Test Results / Process Orders 1 10 '[]'$  - Staff telephones HHA, Nursing Homes / Clarify orders / etc 0 '[]'$  - Routine Transfer to another Facility (non-emergent condition) 0 '[]'$  - Routine Hospital Admission (non-emergent condition) 0 '[]'$  - New Admissions / Biomedical engineer / Ordering NPWT, Apligraf, etc. 0 '[]'$  - Emergency Hospital Admission (emergent condition) 0 X - Simple Discharge Coordination 1 10 '[]'$  - Complex (extensive) Discharge Coordination 0 PROCESS - Special Needs '[]'$  - Pediatric / Minor Patient  Management 0 '[]'$  - Isolation Patient Management 0 '[]'$  - Hearing / Language / Visual special needs 0 '[]'$  - Assessment of Community assistance (transportation,  D/C planning, etc.) 0 '[]'$  - Additional assistance / Altered mentation 0 '[]'$  - Support Surface(s) Assessment (bed, cushion, seat, etc.) 0 INTERVENTIONS - Wound Cleansing / Measurement '[]'$  - Points for Wound Cleaning / Measurement, Wound Dressing, Specimen 0 Collection and Specimen taken to lab can only be taken for a new wound of unknown or different etiology and a procedure is NOT performed to that wound X - Simple Wound Cleansing - one wound 1 5 '[]'$  - Complex Wound Cleansing - multiple wounds 0 X - Wound Imaging (photographs - any number of wounds) 1 5 '[]'$  - Wound Tracing (instead of photographs) 0 X - Simple Wound Measurement - one wound 1 5 '[]'$  - Complex Wound Measurement - multiple wounds 0 INTERVENTIONS - Wound Dressings '[]'$  - Small Wound Dressing one or multiple wounds 0 Turvey, Tavio ([id number]) '[]'$  - Medium Wound Dressing one or multiple wounds 0 '[]'$  - Large Wound Dressing one or multiple wounds 0 INTERVENTIONS - Miscellaneous '[]'$  - External ear exam 0 '[]'$  - Specimen Collection (cultures, biopsies, blood, body fluids, etc.) 0 '[]'$  - Specimen(s) / Culture(s) sent or taken to Lab for analysis 0 '[]'$  - Patient Transfer (multiple staff / Harrel Lemon Lift / Similar devices) 0 '[]'$  - Simple Staple / Suture removal (25 or less) 0 '[]'$  - Complex Staple / Suture removal (26 or more) 0 '[]'$  - Hypo / Hyperglycemic Management (close monitor of Blood Glucose) 0 '[]'$  - Ankle / Brachial Index (ABI) - do not check if billed separately 0 X - Vital Signs 1 5 Has the patient been seen at the hospital within the last three years: Yes Total Score: 65 Level Of Care: New/Established - Level 2 Electronic Signature(s) Signed: 02/19/2016 4:28:52 PM By: Gretta Cool, RN, [name], [name], [name] Entered By: Gretta Cool, RN, [name], Kim on 02/17/2016 16:34:30 Herrington, [name]  ([id number]) -------------------------------------------------------------------------------- Encounter Discharge Information Details Patient Name: [name] Date of Service: 02/17/2016 3:15 PM Medical Record Number: [id number] Patient Account Number: [id number] Date of Birth/Sex: [birth year] (65 y.o. Male) Treating RN: Cornell Barman Primary Care Physician: Royetta Crochet Other Clinician: Referring Physician: Royetta Crochet Treating Physician/Extender: Melburn Hake, HOYT Weeks in Treatment: 6 Encounter Discharge Information Items Discharge Pain Level: 0 Discharge Condition: Stable Ambulatory Status: Ambulatory Discharge Destination: Home Transportation: Private Auto Accompanied By: self Schedule Follow-up Appointment: Yes Medication Reconciliation completed and provided to Patient/Care Yes Belkys Henault: Provided on Clinical Summary of Care: 02/17/2016 Form Type Recipient Paper Patient WD Electronic Signature(s) Signed: 02/17/2016 4:36:46 PM By: Ruthine Dose Entered By: Ruthine Dose on 02/17/2016 16:36:46 Daniels, [name] ([id number]) -------------------------------------------------------------------------------- Lower Extremity Assessment Details Patient Name: [name] Date of Service: 02/17/2016 3:15 PM Medical Record Number: [id number] Patient Account Number: [id number] Date of Birth/Sex: [birth year] (65 y.o. Male) Treating RN: Cornell Barman Primary Care Physician: Royetta Crochet Other Clinician: Referring Physician: Royetta Crochet Treating Physician/Extender: STONE III, HOYT Weeks in Treatment: 6 Edema Assessment Assessed: [Left: No] [Right: No] Edema: [Left: Ye] [Right: s] Calf Left: Right: Point of Measurement: 36 cm From Medial Instep cm 44 cm Ankle Left: Right: Point of Measurement: 14 cm From Medial Instep cm 31 cm Vascular Assessment Claudication: Claudication Assessment [Right:None] Pulses: Posterior Tibial Palpable: [Right:No] Doppler:  [Right:Multiphasic] Dorsalis Pedis Palpable: [Right:Yes] Doppler: [Right:Multiphasic] Extremity colors, hair growth, and conditions: Extremity Color: [Right:Dusky] Hair Growth on Extremity: [Right:Yes] Temperature of Extremity: [Right:Warm] Capillary Refill: [Right:< 3 seconds] Dependent Rubor: [Right:No] Blanched when Elevated: [Right:No] Lipodermatosclerosis: [Right:No] Blood Pressure: Brachial: [Right:138] Dorsalis Pedis: [Left:Dorsalis Pedis: 162] Ankle: Posterior Tibial: [Left:Posterior Tibial: 144] [Right:1.17] Toe Nail  Assessment Rosol, ROWAN ([id number]) Left: Right: Thick: Yes Discolored: No Deformed: No Improper Length and Hygiene: No Electronic Signature(s) Signed: 02/19/2016 4:28:52 PM By: Gretta Cool, RN, [name], [name], [name] Entered By: Gretta Cool, RN, [name], Kim on 02/17/2016 15:37:16 Jahr, [name] ([id number]) -------------------------------------------------------------------------------- Multi Wound Chart Details Patient Name: [name] Date of Service: 02/17/2016 3:15 PM Medical Record Number: [id number] Patient Account Number: [id number] Date of Birth/Sex: [birth year] (65 y.o. Male) Treating RN: Cornell Barman Primary Care Physician: Royetta Crochet Other Clinician: Referring Physician: Royetta Crochet Treating Physician/Extender: STONE III, HOYT Weeks in Treatment: 6 Vital Signs Height(in): 74 Pulse(bpm): 75 Weight(lbs): 240 Blood Pressure 133/73 (mmHg): Body Mass Index(BMI): 31 Temperature(F): 98.1 Respiratory Rate 18 (breaths/min): Photos: [3:No Photos] [N/A:N/A] Wound Location: [3:Right Lower Leg] [N/A:N/A] Wounding Event: [3:Gradually Appeared] [N/A:N/A] Primary Etiology: [3:Venous Leg Ulcer] [N/A:N/A] Comorbid History: [3:Arrhythmia, Hypertension, N/A Gout, Neuropathy] Date Acquired: [3:02/11/2016] [N/A:N/A] Weeks of Treatment: [3:0] [N/A:N/A] Wound Status: [3:Open] [N/A:N/A] Clustered Wound: [3:Yes] [N/A:N/A] Measurements L x W x D  5.8x1.2x0.1 [N/A:N/A] (cm) Area (cm) : [3:5.466] [N/A:N/A] Volume (cm) : [3:0.547] [N/A:N/A] Classification: [3:Full Thickness Without Exposed Support Structures] [N/A:N/A] Exudate Amount: [3:Medium] [N/A:N/A] Exudate Type: [3:Serosanguineous] [N/A:N/A] Exudate Color: [3:red, brown] [N/A:N/A] Wound Margin: [3:Flat and Intact] [N/A:N/A] Granulation Amount: [3:Medium (34-66%)] [N/A:N/A] Granulation Quality: [3:Red] [N/A:N/A] Necrotic Amount: [3:Medium (34-66%)] [N/A:N/A] Exposed Structures: [3:Fascia: No Fat: No Tendon: No Muscle: No Joint: No Bone: No] [N/A:N/A] Limited to Skin Breakdown Epithelialization: Small (1-33%) N/A N/A Periwound Skin Texture: Induration: Yes N/A N/A Scarring: Yes Edema: No Excoriation: No Callus: No Crepitus: No Fluctuance: No Friable: No Rash: No Periwound Skin Maceration: No N/A N/A Moisture: Moist: No Dry/Scaly: No Periwound Skin Color: Atrophie Blanche: No N/A N/A Cyanosis: No Ecchymosis: No Erythema: No Hemosiderin Staining: No Mottled: No Pallor: No Rubor: No Tenderness on No N/A N/A Palpation: Wound Preparation: Ulcer Cleansing: N/A N/A Rinsed/Irrigated with Saline Topical Anesthetic Applied: Other: lidoaine 4% Treatment Notes Electronic Signature(s) Signed: 02/19/2016 4:28:52 PM By: Gretta Cool, RN, [name], [name], [name] Entered By: Gretta Cool, RN, [name], Kim on 02/17/2016 15:42:56 Lesser, [name] ([id number]) -------------------------------------------------------------------------------- Multi-Disciplinary Care Plan Details Patient Name: [name] Date of Service: 02/17/2016 3:15 PM Medical Record Number: [id number] Patient Account Number: [id number] Date of Birth/Sex: 06-30-1950 (65 y.o. Male) Treating RN: Cornell Barman Primary Care Physician: Royetta Crochet Other Clinician: Referring Physician: Royetta Crochet Treating Physician/Extender: Melburn Hake, HOYT Weeks in Treatment: 6 Active Inactive Venous Leg Ulcer Nursing  Diagnoses: Actual venous Insuffiency (use after diagnosis is confirmed) Goals: Patient will maintain optimal edema control Date Initiated: 02/17/2016 Goal Status: Active Interventions: Compression as ordered Notes: Wound/Skin Impairment Nursing Diagnoses: Impaired tissue integrity Goals: Patient/caregiver will verbalize understanding of skin care regimen Date Initiated: 02/17/2016 Goal Status: Active Ulcer/skin breakdown will have a volume reduction of 30% by week 4 Date Initiated: 02/17/2016 Goal Status: Active Interventions: Assess ulceration(s) every visit Notes: Electronic Signature(s) Signed: 02/19/2016 4:28:52 PM By: Gretta Cool, RN, [name], [name], [name] Entered By: Gretta Cool, RN, [name], Kim on 02/17/2016 15:42:48 Dubose, [name] ([id number]) Mizner, [name] ([id number]) -------------------------------------------------------------------------------- Pain Assessment Details Patient Name: [name] Date of Service: 02/17/2016 3:15 PM Medical Record Number: [id number] Patient Account Number: [id number] Date of Birth/Sex: [birth year] (65 y.o. Male) Treating RN: Cornell Barman Primary Care Physician: Royetta Crochet Other Clinician: Referring Physician: Royetta Crochet Treating Physician/Extender: STONE III, HOYT Weeks in Treatment: 6 Active Problems Location of Pain Severity and Description of Pain Patient Has Paino Yes Site Locations Pain Location: Pain in Ulcers With Dressing Change: Yes Rate the pain. Current Pain Level: 6  Character of Pain Describe the Pain: Sharp, Shooting, Tender, Throbbing Pain Management and Medication Current Pain Management: Notes Topical or injectable lidocaine is offered to patient for acute pain when surgical debridement is performed. If needed, Patient is instructed to use over the counter pain medication for the following 24-48 hours after debridement. Wound care MDs do not prescribed pain medications. Patient has chronic pain or  uncontrolled pain. Patient has been instructed to make an appointment with their Primary Care Physician for pain management. Electronic Signature(s) Signed: 02/19/2016 4:28:52 PM By: Gretta Cool, RN, [name], [name], [name] Entered By: Gretta Cool, RN, [name], Kim on 02/17/2016 15:31:02 Mow, [name] ([id number]) -------------------------------------------------------------------------------- Patient/Caregiver Education Details Patient Name: [name] Date of Service: 02/17/2016 3:15 PM Medical Record Number: [id number] Patient Account Number: [id number] Date of Birth/Gender: 08-13-1950 (65 y.o. Male) Treating RN: Cornell Barman Primary Care Physician: Royetta Crochet Other Clinician: Referring Physician: Royetta Crochet Treating Physician/Extender: Sharalyn Ink in Treatment: 6 Education Assessment Education Provided To: Patient Education Topics Provided Venous: Handouts: Controlling Swelling with Multilayered Compression Wraps Methods: Demonstration Responses: State content correctly Electronic Signature(s) Signed: 02/19/2016 4:28:52 PM By: Gretta Cool, RN, [name], [name], [name] Entered By: Gretta Cool, RN, [name], Kim on 02/17/2016 16:36:10 Radcliffe, [name] ([id number]) -------------------------------------------------------------------------------- Wound Assessment Details Patient Name: [name] Date of Service: 02/17/2016 3:15 PM Medical Record Number: [id number] Patient Account Number: [id number] Date of Birth/Sex: 1950-08-16 (65 y.o. Male) Treating RN: Cornell Barman Primary Care Physician: Royetta Crochet Other Clinician: Referring Physician: Royetta Crochet Treating Physician/Extender: Melburn Hake, HOYT Weeks in Treatment: 6 Wound Status Wound Number: 3 Primary Venous Leg Ulcer Etiology: Wound Location: Right Lower Leg Wound Status: Open Wounding Event: Gradually Appeared Comorbid Arrhythmia, Hypertension, Gout, Date Acquired: 02/11/2016 History: Neuropathy Weeks Of Treatment:  0 Clustered Wound: Yes Photos Photo Uploaded By: Gretta Cool, RN, [name], Kim on 02/17/2016 16:02:19 Wound Measurements Length: (cm) 5.8 % Reduction in Ar Width: (cm) 1.2 % Reduction in Vo Depth: (cm) 0.1 Epithelialization Area: (cm) 5.466 Tunneling: Volume: (cm) 0.547 ea: lume: : Small (1-33%) No Wound Description Full Thickness Without Exposed Foul Odor After Classification: Support Structures Wound Margin: Flat and Intact Exudate Medium Amount: Exudate Type: Serosanguineous Exudate Color: red, brown Cleansing: No Wound Bed Granulation Amount: Medium (34-66%) Exposed Structure Granulation Quality: Red Fascia Exposed: No Necrotic Amount: Medium (34-66%) Fat Layer Exposed: No Necrotic Quality: Adherent Slough Tendon Exposed: No Muscle Exposed: No Icenhower, Mallory ([id number]) Joint Exposed: No Bone Exposed: No Limited to Skin Breakdown Periwound Skin Texture Texture Color No Abnormalities Noted: No No Abnormalities Noted: No Callus: No Atrophie Blanche: No Crepitus: No Cyanosis: No Excoriation: No Ecchymosis: No Fluctuance: No Erythema: No Friable: No Hemosiderin Staining: No Induration: Yes Mottled: No Localized Edema: No Pallor: No Rash: No Rubor: No Scarring: Yes Moisture No Abnormalities Noted: No Dry / Scaly: No Maceration: No Moist: No Wound Preparation Ulcer Cleansing: Rinsed/Irrigated with Saline Topical Anesthetic Applied: Other: lidoaine 4%, Treatment Notes Wound #3 (Right Lower Leg) 1. Cleansed with: Clean wound with Normal Saline 2. Anesthetic Topical Lidocaine 4% cream to wound bed prior to debridement 3. Peri-wound Care: Barrier cream 4. Dressing Applied: Aquacel Ag 5. Secondary Dressing Applied ABD Pad 7. Secured with 4-Layer Compression System - Right Lower Extremity Electronic Signature(s) Signed: 02/19/2016 4:28:52 PM By: Gretta Cool, RN, [name], [name], [name] Entered By: Gretta Cool, RN, [name], Kim on 02/17/2016 15:40:27 Frieson,  [name] ([id number]) -------------------------------------------------------------------------------- Vitals Details Patient Name: [name] Date of Service: 02/17/2016 3:15 PM Medical Record Number: [id number] Patient Account Number: [id number] Date of Birth/Sex:  09-Jul-1950 (65 y.o. Male) Treating RN: Cornell Barman Primary Care Physician: Royetta Crochet Other Clinician: Referring Physician: Royetta Crochet Treating Physician/Extender: STONE III, HOYT Weeks in Treatment: 6 Vital Signs Time Taken: 15:31 Temperature (F): 98.1 Height (in): 74 Pulse (bpm): 75 Weight (lbs): 240 Respiratory Rate (breaths/min): 18 Body Mass Index (BMI): 30.8 Blood Pressure (mmHg): 133/73 Reference Range: 80 - 120 mg / dl Electronic Signature(s) Signed: 02/19/2016 4:28:52 PM By: Gretta Cool, RN, [name], [name], [name] Entered By: Gretta Cool, RN, [name], Kim on 02/17/2016 15:31:22

## 2016-02-25 ENCOUNTER — Encounter: Payer: Medicare Other | Admitting: Physician Assistant

## 2016-02-25 DIAGNOSIS — I87332 Chronic venous hypertension (idiopathic) with ulcer and inflammation of left lower extremity: Secondary | ICD-10-CM | POA: Diagnosis not present

## 2016-02-26 NOTE — Progress Notes (Signed)
[name], [name] ([id number]) Visit Report for 02/25/2016 Arrival Information Details Patient Name: [name] Date of Service: 02/25/2016 8:45 AM Medical Record Number: [id number] Patient Account Number: [id number] Date of Birth/Sex: 11-Mar-1951 (65 y.o. Male) Treating RN: Ahmed Prima Primary Care Physician: Royetta Crochet Other Clinician: Referring Physician: Royetta Crochet Treating Physician/Extender: Melburn Hake, HOYT Weeks in Treatment: 7 Visit Information History Since Last Visit All ordered tests and consults were completed: No Patient Arrived: Ambulatory Added or deleted any medications: No Arrival Time: 08:44 Any new allergies or adverse reactions: No Accompanied By: self Had a fall or experienced change in No Transfer Assistance: None activities of daily living that may affect Patient Identification Verified: Yes risk of falls: Secondary Verification Process Yes Signs or symptoms of abuse/neglect since last No Completed: visito Patient Requires Transmission- No Hospitalized since last visit: No Based Precautions: Pain Present Now: No Patient Has Alerts: Yes Patient Alerts: Patient on Blood Thinner warfarin Electronic Signature(s) Signed: 02/25/2016 4:35:45 PM By: Alric Quan Entered By: Alric Quan on 02/25/2016 08:45:04 [name] ([id number]) -------------------------------------------------------------------------------- Encounter Discharge Information Details Patient Name: [name] Date of Service: 02/25/2016 8:45 AM Medical Record Number: [id number] Patient Account Number: [id number] Date of Birth/Sex: [birth year] (65 y.o. Male) Treating RN: Ahmed Prima Primary Care Physician: Royetta Crochet Other Clinician: Referring Physician: Royetta Crochet Treating Physician/Extender: Melburn Hake, HOYT Weeks in Treatment: 7 Encounter Discharge Information Items Discharge Pain Level: 0 Discharge Condition: Stable Ambulatory  Status: Ambulatory Discharge Destination: Home Private Transportation: Auto Accompanied By: self Schedule Follow-up Appointment: Yes Medication Reconciliation completed and Yes provided to Patient/Care Tarren Sabree: Clinical Summary of Care: Electronic Signature(s) Signed: 02/25/2016 4:35:45 PM By: Alric Quan Previous Signature: 02/25/2016 9:44:45 AM Version By: Ruthine Dose Entered By: Alric Quan on 02/25/2016 09:45:00 [name] ([id number]) -------------------------------------------------------------------------------- Lower Extremity Assessment Details Patient Name: [name] Date of Service: 02/25/2016 8:45 AM Medical Record Number: [id number] Patient Account Number: [id number] Date of Birth/Sex: [birth year] (65 y.o. Male) Treating RN: Ahmed Prima Primary Care Physician: Royetta Crochet Other Clinician: Referring Physician: Royetta Crochet Treating Physician/Extender: STONE III, HOYT Weeks in Treatment: 7 Edema Assessment Assessed: [Left: No] [Right: No] E[Left: dema] [Right: :] Calf Left: Right: Point of Measurement: 36 cm From Medial Instep cm 48.8 cm Ankle Left: Right: Point of Measurement: 14 cm From Medial Instep cm 27.5 cm Vascular Assessment Pulses: Posterior Tibial Dorsalis Pedis Palpable: [Right:Yes] Extremity colors, hair growth, and conditions: Extremity Color: [Right:Hyperpigmented] Temperature of Extremity: [Right:Warm] Capillary Refill: [Right:< 3 seconds] Toe Nail Assessment Left: Right: Thick: Yes Discolored: Yes Deformed: No Improper Length and Hygiene: No Electronic Signature(s) Signed: 02/25/2016 4:35:45 PM By: Alric Quan Entered By: Alric Quan on 02/25/2016 08:52:36 [name] ([id number]) -------------------------------------------------------------------------------- Multi Wound Chart Details Patient Name: [name] Date of Service: 02/25/2016 8:45 AM Medical Record Number:  [id number] Patient Account Number: [id number] Date of Birth/Sex: [birth year] (65 y.o. Male) Treating RN: Carolyne Fiscal, Debi Primary Care Physician: Royetta Crochet Other Clinician: Referring Physician: Royetta Crochet Treating Physician/Extender: STONE III, HOYT Weeks in Treatment: 7 Vital Signs Height(in): 74 Pulse(bpm): 58 Weight(lbs): 240 Blood Pressure 150/94 (mmHg): Body Mass Index(BMI): 31 Temperature(F): 97.6 Respiratory Rate 18 (breaths/min): Photos: [3:No Photos] [N/A:N/A] Wound Location: [3:Right Lower Leg] [N/A:N/A] Wounding Event: [3:Gradually Appeared] [N/A:N/A] Primary Etiology: [3:Venous Leg Ulcer] [N/A:N/A] Comorbid History: [3:Arrhythmia, Hypertension, N/A Gout, Neuropathy] Date Acquired: [3:02/11/2016] [N/A:N/A] Weeks of Treatment: [3:1] [N/A:N/A] Wound Status: [3:Open] [N/A:N/A] Clustered Wound: [3:Yes] [N/A:N/A] Measurements L x W x D 6x1.2x0.2 [N/A:N/A] (cm) Area (cm) : [3:5.655] [N/A:N/A] Volume (cm) : [3:1.131] [N/A:N/A] % Reduction  in Area: [3:-3.50%] [N/A:N/A] % Reduction in Volume: -106.80% [N/A:N/A] Classification: [3:Full Thickness Without Exposed Support Structures] [N/A:N/A] Exudate Amount: [3:Large] [N/A:N/A] Exudate Type: [3:Serosanguineous] [N/A:N/A] Exudate Color: [3:red, brown] [N/A:N/A] Wound Margin: [3:Flat and Intact] [N/A:N/A] Granulation Amount: [3:Medium (34-66%)] [N/A:N/A] Granulation Quality: [3:Red] [N/A:N/A] Necrotic Amount: [3:Medium (34-66%)] [N/A:N/A] Exposed Structures: [3:Fascia: No Fat: No Tendon: No Muscle: No] [N/A:N/A] Joint: No Bone: No Limited to Skin Breakdown Epithelialization: Small (1-33%) N/A N/A Periwound Skin Texture: Induration: Yes N/A N/A Scarring: Yes Edema: No Excoriation: No Callus: No Crepitus: No Fluctuance: No Friable: No Rash: No Periwound Skin Maceration: No N/A N/A Moisture: Moist: No Dry/Scaly: No Periwound Skin Color: Atrophie Blanche: No N/A N/A Cyanosis: No Ecchymosis:  No Erythema: No Hemosiderin Staining: No Mottled: No Pallor: No Rubor: No Tenderness on No N/A N/A Palpation: Wound Preparation: Ulcer Cleansing: N/A N/A Rinsed/Irrigated with Saline Topical Anesthetic Applied: Other: lidoaine 4% Treatment Notes Electronic Signature(s) Signed: 02/25/2016 4:35:45 PM By: Alric Quan Entered By: Alric Quan on 02/25/2016 09:10:15 Koors, Juleen [name] ([id number]) -------------------------------------------------------------------------------- Multi-Disciplinary Care Plan Details Patient Name: [name] Date of Service: 02/25/2016 8:45 AM Medical Record Number: [id number] Patient Account Number: [id number] Date of Birth/Sex: 04-24-1951 (65 y.o. Male) Treating RN: Ahmed Prima Primary Care Physician: Royetta Crochet Other Clinician: Referring Physician: Royetta Crochet Treating Physician/Extender: Melburn Hake, HOYT Weeks in Treatment: 7 Active Inactive Venous Leg Ulcer Nursing Diagnoses: Actual venous Insuffiency (use after diagnosis is confirmed) Goals: Patient will maintain optimal edema control Date Initiated: 02/17/2016 Goal Status: Active Interventions: Compression as ordered Notes: Wound/Skin Impairment Nursing Diagnoses: Impaired tissue integrity Goals: Patient/caregiver will verbalize understanding of skin care regimen Date Initiated: 02/17/2016 Goal Status: Active Ulcer/skin breakdown will have a volume reduction of 30% by week 4 Date Initiated: 02/17/2016 Goal Status: Active Interventions: Assess ulceration(s) every visit Notes: Electronic Signature(s) Signed: 02/25/2016 4:35:45 PM By: Alric Quan Entered By: Alric Quan on 02/25/2016 09:10:06 Wiemann, Juleen [name] ([id number]) Devoss, Juleen [name] ([id number]) -------------------------------------------------------------------------------- Pain Assessment Details Patient Name: [name] Date of Service: 02/25/2016 8:45 AM Medical Record  Number: [id number] Patient Account Number: [id number] Date of Birth/Sex: 10/07/1950 (65 y.o. Male) Treating RN: Ahmed Prima Primary Care Physician: Royetta Crochet Other Clinician: Referring Physician: Royetta Crochet Treating Physician/Extender: STONE III, HOYT Weeks in Treatment: 7 Active Problems Location of Pain Severity and Description of Pain Patient Has Paino Yes Site Locations Pain Location: Pain in Ulcers With Dressing Change: Yes Rate the pain. Current Pain Level: 5 Character of Pain Describe the Pain: Burning Pain Management and Medication Current Pain Management: Electronic Signature(s) Signed: 02/25/2016 4:35:45 PM By: Alric Quan Entered By: Alric Quan on 02/25/2016 08:45:24 Gambrill, Juleen [name] ([id number]) -------------------------------------------------------------------------------- Patient/Caregiver Education Details Patient Name: [name] Date of Service: 02/25/2016 8:45 AM Medical Record Number: [id number] Patient Account Number: [id number] Date of Birth/Gender: [birth year] (66 y.o. Male) Treating RN: Ahmed Prima Primary Care Physician: Royetta Crochet Other Clinician: Referring Physician: Royetta Crochet Treating Physician/Extender: Sharalyn Ink in Treatment: 7 Education Assessment Education Provided To: Patient Education Topics Provided Wound/Skin Impairment: Handouts: Other: keep wrap clean and dry Methods: Demonstration, Explain/Verbal Responses: State content correctly Electronic Signature(s) Signed: 02/25/2016 4:35:45 PM By: Alric Quan Entered By: Alric Quan on 02/25/2016 09:45:14 Metzgar, Juleen [name] ([id number]) -------------------------------------------------------------------------------- Wound Assessment Details Patient Name: [name] Date of Service: 02/25/2016 8:45 AM Medical Record Number: [id number] Patient Account Number: [id number] Date of Birth/Sex: [birth year] (65 y.o.  Male) Treating RN: Carolyne Fiscal, Debi Primary Care Physician: Royetta Crochet Other Clinician: Referring Physician: Royetta Crochet Treating Physician/Extender: STONE III, HOYT Weeks in Treatment: 7  Wound Status Wound Number: 3 Primary Venous Leg Ulcer Etiology: Wound Location: Right Lower Leg Wound Status: Open Wounding Event: Gradually Appeared Comorbid Arrhythmia, Hypertension, Gout, Date Acquired: 02/11/2016 History: Neuropathy Weeks Of Treatment: 1 Clustered Wound: Yes Photos Photo Uploaded By: Alric Quan on 02/25/2016 11:25:07 Wound Measurements Length: (cm) 6 Width: (cm) 1.2 Depth: (cm) 0.2 Area: (cm) 5.655 Volume: (cm) 1.131 % Reduction in Area: -3.5% % Reduction in Volume: -106.8% Epithelialization: Small (1-33%) Tunneling: No Undermining: No Wound Description Full Thickness Without Exposed Foul Odor Afte Classification: Support Structures Wound Margin: Flat and Intact Exudate Large Amount: Exudate Type: Serosanguineous Exudate Color: red, brown r Cleansing: No Wound Bed Granulation Amount: Medium (34-66%) Exposed Structure Granulation Quality: Red Fascia Exposed: No Necrotic Amount: Medium (34-66%) Fat Layer Exposed: No Niccoli, Jamonta ([id number]) Necrotic Quality: Adherent Slough Tendon Exposed: No Muscle Exposed: No Joint Exposed: No Bone Exposed: No Limited to Skin Breakdown Periwound Skin Texture Texture Color No Abnormalities Noted: No No Abnormalities Noted: No Callus: No Atrophie Blanche: No Crepitus: No Cyanosis: No Excoriation: No Ecchymosis: No Fluctuance: No Erythema: No Friable: No Hemosiderin Staining: No Induration: Yes Mottled: No Localized Edema: No Pallor: No Rash: No Rubor: No Scarring: Yes Moisture No Abnormalities Noted: No Dry / Scaly: No Maceration: No Moist: No Wound Preparation Ulcer Cleansing: Rinsed/Irrigated with Saline Topical Anesthetic Applied: Other: lidoaine 4%, Treatment  Notes Wound #3 (Right Lower Leg) 1. Cleansed with: Clean wound with Normal Saline Cleanse wound with antibacterial soap and water 2. Anesthetic Topical Lidocaine 4% cream to wound bed prior to debridement 3. Peri-wound Care: Barrier cream 4. Dressing Applied: Aquacel Ag 5. Secondary Dressing Applied ABD Pad Dry Gauze 7. Secured with Tape 4-Layer Compression System - Right Lower Extremity Notes MACHAI, DESMITH ([id number]) Louretta Parma to anchor Electronic Signature(s) Signed: 02/25/2016 4:35:45 PM By: Alric Quan Entered By: Alric Quan on 02/25/2016 08:55:45 Bisceglia, Juleen [name] ([id number]) -------------------------------------------------------------------------------- Vitals Details Patient Name: [name] Date of Service: 02/25/2016 8:45 AM Medical Record Number: [id number] Patient Account Number: [id number] Date of Birth/Sex: 1950-11-11 (65 y.o. Male) Treating RN: Carolyne Fiscal, Debi Primary Care Physician: Royetta Crochet Other Clinician: Referring Physician: Royetta Crochet Treating Physician/Extender: STONE III, HOYT Weeks in Treatment: 7 Vital Signs Time Taken: 08:45 Temperature (F): 97.6 Height (in): 74 Pulse (bpm): 58 Weight (lbs): 240 Respiratory Rate (breaths/min): 18 Body Mass Index (BMI): 30.8 Blood Pressure (mmHg): 150/94 Reference Range: 80 - 120 mg / dl Electronic Signature(s) Signed: 02/25/2016 4:35:45 PM By: Alric Quan Entered By: Alric Quan on 02/25/2016 08:48:08

## 2016-02-26 NOTE — Progress Notes (Signed)
MALVERN, KADLEC ([id number]) Visit Report for 02/25/2016 Chief Complaint Document Details [place], 02/25/2016 8:45 Patient Name: Date of Service: [name] Number: Treating RN: [name] Date of Birth/Sex: 1950/11/13 (65 y.o. Male) Other Clinician: Primary Care Physician: [name] Treating [name] Referring Physician: Royetta [name] Physician/Extender: Weeks in Treatment: 7 Information Obtained from: Patient Chief Complaint Patient follows up today due to reopening of the left medial venous ulcer Electronic Signature(s) Signed: 02/25/2016 4:52:13 PM By: Mark Keeler PA-C Entered By: [name] on 02/25/2016 09:53:13 [name], [name] ([id number]) -------------------------------------------------------------------------------- Debridement Details January, 02/25/2016 8:45 Patient Name: Date of Service: [name] AM Medical Record Patient Account Number: [id number] Number: Treating RN: [name] Date of Birth/Sex: 03/27/1951 (65 y.o. Male) Other Clinician: Primary Care Physician: [name] Treating [name] Referring Physician: Royetta [name] Physician/Extender: Weeks in Treatment: 7 Debridement Performed for Wound #3 Right Lower Leg Assessment: Performed By: Physician Mark III, Mozes Sagar, Debridement: Debridement Pre-procedure Yes - 09:11 Verification/Time Out Taken: Start Time: 09:12 [name] Control: Lidocaine 4% Topical Solution Level: Skin/Subcutaneous Tissue Total Area Debrided (L x 6 (cm) x 1.2 (cm) = 7.2 (cm) W): Tissue and other Viable, Non-Viable, Exudate, Fibrin/Slough, Subcutaneous material debrided: Instrument: Curette Bleeding: Minimum Hemostasis Achieved: Pressure End Time: 09:14 Procedural [name]: 0 Post Procedural [name]: 0 Response to Treatment: Procedure was tolerated well Post Debridement Measurements of Total Wound Length: (cm)  6 Width: (cm) 1.2 Depth: (cm) 0.2 Volume: (cm) 1.131 Character of Wound/Ulcer Post Requires Further Debridement Debridement: Severity of Tissue Post Debridement: Fat layer exposed Post Procedure Diagnosis Same as Pre-procedure Electronic Signature(s) Signed: 02/25/2016 9:39:47 AM By: Mark Keeler PA-C Signed: 02/25/2016 4:35:45 PM By: [name], [name] ([id number]) Entered By: Mark Quan on 02/25/2016 09:13:18 [name], [name] ([id number]) -------------------------------------------------------------------------------- HPI Details [name], 02/25/2016 8:45 Patient Name: Date of Service: [name] AM Medical Record Patient Account Number: [id number] Number: Treating RN: [name] Date of Birth/Sex: [birth year] (65 y.o. Male) Other Clinician: Primary Care Physician: [name] Treating Mark III, Mieshia Pepitone Referring Physician: Royetta [name] Physician/Extender: Weeks in Treatment: 7 History of Present Illness Location: open wound just above his right ankle medially Quality: Patient tells me he is not having a significant amount of [name] at this point in time. Severity: 1 out of 10 Duration: This just reopened in the past week. Timing: [name] in wound is Intermittent Context: The wound appeared gradually over time Modifying Factors: Consults to this date include: he was seen in the ER and was referred to a vascular surgeon but the patient has not done that. He may have been treated with clindamycin in the ER. Associated Signs and Symptoms: Patient reports having difficulty standing for long periods. HPI Description: 65 year old gentleman who was seen in the emergency department recently on 01/06/2015 for a wound of his right lower extremity which he says was not involving any injury and he did not know how he sustained it. He had draining foul-smelling liquid from the area and had gone for care there. his past medical history is  significant for DVT, hypertension, gout, tobacco abuse, cocaine abuse, stroke, atrial fibrillation, pulmonary embolism. he has also had some vascular surgery with a stent placed in his leg. He has been a smoker for many years and has given up straight drugs several years ago. He continues to smoke about 4-5 cigarettes a day. 02/03/2015 -- received a note from 05/14/2013 where Dr. Leotis [name] placed an inferior vena cava filter. The patient had  a deep vein thrombosis while therapeutic on anticoagulation for previous DVT and a IVC filter was placed for this. 02/10/2015 -- he did have his vascular test done on Friday but we have no reports yet. 02/17/2015 -- notes were reviewed from the vascular office and the patient had a venous ultrasound done which revealed that he had no reflux in the greater saphenous vein or the short saphenous vein bilaterally. He did have subacute DVT in the common femoral vein and popliteal veins on the right and left side. The recommendation was to continue with Unna's boot therapy at the wound clinic and then to wear graduated compression stockings once the ulcers healed and later if he had continuous problems lymphedema pump would benefit him. 03/17/2015 -- we have applied for his insurance and aide regarding cellular tissue-based products and are still awaiting the final clearance. 03/24/2015 -- he has had Apligraf authorized for him but his wound is looking so good today that we may not use it. 03/31/2015 -- he has not yet received his compression stockings though we have called a couple of times and hopefully they should arrive this week. [name] ([id number]) 01/06/16; this is a patient we have previously cared for in this clinic with wounds on his right medial ankle. I was not previously involved with his care. He has a history of DVT and is on chronic Coumadin and one point had an inferior vena cava filter I'm not sure if that is still in  place. He wears compression stockings. He had reflux studies done during his last stay in this clinic which did not show significant reflux in the greater or lesser saphenous veins bilaterally. His history is that he developed a open sore on the left medial malleolus one week ago. He was seen in his primary physician office and given a course of doxycycline which he still should be on. Previously seen vascular surgery who felt that he had some degree of lymphedema as well. He is not a diabetic 01/13/16 no major change 01/20/16; very small wound on the medial right ankle again covered with surface slough that doesn't seem to be spotting the Prisma 01/27/16; patient comes in today complaining of a lot of [name] around the wound site. He has not been systemically unwell. 02/03/16; the patient's wound culture last week grew Proteus, I had empirically given doxycycline. The Proteus was not specifically plated against doxycycline however Proteus itself was fairly pansensitive and the patient comes back feeling a lot better today. I think the doxycycline was likely to be successful in sufficient 02/10/16; as predicted last week the area has closed over. These are probably venous insufficiency wounds although his previous reflux studies did not show superficial reflux. He also has a history of DVT and at one time had a Greenfield filter in place. The area in question on his left medial ankle region. It became secondarily infected but responded nicely to antibiotics. He is closed today 02/17/16 unfortunately patient's venous wound on the medial aspect of his right ankle at this point in time has reopened. He has been using some compression hose which appear to be very light that he purchased he tells me out of a magazine. He seems a little frustrated with the fact that this has reopened and is concerned about his left lower extremity possibly reopening as well. 02/25/16 patient presents today for follow-up  evaluation regarding his right ankle wound. Currently he shows no interval signs or symptoms of infection. We have been compression  wrapping him unfortunately the wraps that we had on him last week and he has a significant amount of swelling above whether this had slipped down to. He also notes that he's been having some burning as well at the wound site. He rates his discomfort at this point in time to be a 2-3 out of 10. Otherwise he has no other worsening symptoms. Electronic Signature(s) Signed: 02/25/2016 4:52:13 PM By: Mark Keeler PA-C Entered By: [name] on 02/25/2016 09:55:14 Jarrard, [name] ([id number]) -------------------------------------------------------------------------------- Physical Exam Details [name], 02/25/2016 8:45 Patient Name: Date of Service: [facility] AM Medical Record Patient Account Number: [id number] Number: Treating RN: [name] Date of Birth/Sex: 1950/11/25 (65 y.o. Male) Other Clinician: Primary Care Physician: [name] Treating Mark III, Daanya Lanphier Referring Physician: Royetta [name] Physician/Extender: Weeks in Treatment: 7 Constitutional Well-nourished and well-hydrated in no acute distress. Respiratory normal breathing without difficulty. Cardiovascular bilateral lower extremity lymphedema and patient's right lower extremity edema is significantly worse above where the wrap slipped down over the past week. Psychiatric this patient is able to make decisions and demonstrates good insight into disease process. Alert and Oriented x 3. pleasant and cooperative. Electronic Signature(s) Signed: 02/25/2016 4:52:13 PM By: Mark Keeler PA-C Entered By: [name] on 02/25/2016 09:55:45 [name], [name] ([id number]) -------------------------------------------------------------------------------- Physician Orders Details [place], 02/25/2016 8:45 Patient Name: Date of Service: [facility] AM Medical Record  Patient Account Number: [id number] Number: Treating RN: [name] Date of Birth/Sex: Mar 01, 1951 (65 y.o. Male) Other Clinician: Primary Care Physician: [name] Treating Mark III, Kandee Escalante Referring Physician: Royetta [name] Physician/Extender: Weeks in Treatment: 7 Verbal / Phone Orders: Yes ClinicianCarolyne Fiscal, Debi Read Back and Verified: Yes Diagnosis Coding Wound Cleansing Wound #3 Right Lower Leg o Clean wound with Normal Saline. Anesthetic Wound #3 Right Lower Leg o Topical Lidocaine 4% cream applied to wound bed prior to debridement Skin Barriers/Peri-Wound Care Wound #3 Right Lower Leg o Barrier cream Primary Wound Dressing Wound #3 Right Lower Leg o Aquacel Ag Secondary Dressing Wound #3 Right Lower Leg o ABD pad Dressing Change Frequency Wound #3 Right Lower Leg o Change dressing every week Follow-up Appointments Wound #3 Right Lower Leg o Return Appointment in 1 week. Edema Control Wound #3 Right Lower Leg o 4-Layer Compression System - Right Lower Extremity - unna to anchor Ezra, [place] ([id number]) Additional Orders / Instructions Wound #3 Right Lower Leg o Increase protein intake. Electronic Signature(s) Signed: 02/25/2016 4:35:45 PM By: Mark Quan Signed: 02/25/2016 4:52:13 PM By: Mark Keeler PA-C Previous Signature: 02/25/2016 9:39:47 AM Version By: Mark Keeler PA-C Entered By: Mark Quan on 02/25/2016 09:44:29 [name], [name] ([id number]) -------------------------------------------------------------------------------- Problem List Details [place], 02/25/2016 8:45 Patient Name: Date of Service: [facility] AM Medical Record Patient Account Number: [id number] Number: Treating RN: [name] Date of Birth/Sex: 1950/07/08 (65 y.o. 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/06/2016 Yes breakdown of skin I87.332 Chronic venous hypertension (idiopathic) with ulcer and 01/06/2016 Yes inflammation of left lower extremity Inactive Problems Resolved Problems Electronic Signature(s) Signed: 02/25/2016 4:52:13 PM By: Mark Keeler PA-C Entered By: [name] on 02/25/2016 09:52:54 [name], [name] ([id number]) -------------------------------------------------------------------------------- Progress Note Details [name], 02/25/2016 8:45 Patient Name: Date of Service: [facility] AM Medical Record Patient Account Number: [id number] Number: Treating RN: [name] Date of  Birth/Sex: 18-Dec-1950 (65 y.o. Male) Other Clinician: Primary Care Physician: [name] Treating Mark III, Jaeden Messer Referring Physician: Royetta [name] Physician/Extender: Weeks in Treatment: 7 Subjective Chief Complaint Information obtained from Patient Patient follows up today due to reopening of the left medial venous ulcer History of Present Illness (HPI) The following HPI elements were documented for the patient's wound: Location: open wound just above his right ankle medially Quality: Patient tells me he is not having a significant amount of [name] at this point in time. Severity: 1 out of 10 Duration: This just reopened in the past week. Timing: [name] in wound is Intermittent Context: The wound appeared gradually over time Modifying Factors: Consults to this date include: he was seen in the ER and was referred to a vascular surgeon but the patient has not done that. He may have been treated with clindamycin in the ER. Associated Signs and Symptoms: Patient reports having difficulty standing for long periods. 65 year old gentleman who was seen in the emergency department  recently on 01/06/2015 for a wound of his right lower extremity which he says was not involving any injury and he did not know how he sustained it. He had draining foul-smelling liquid from the area and had gone for care there. his past medical history is significant for DVT, hypertension, gout, tobacco abuse, cocaine abuse, stroke, atrial fibrillation, pulmonary embolism. he has also had some vascular surgery with a stent placed in his leg. He has been a smoker for many years and has given up straight drugs several years ago. He continues to smoke about 4-5 cigarettes a day. 02/03/2015 -- received a note from 05/14/2013 where Dr. Leotis [name] placed an inferior vena cava filter. The patient had a deep vein thrombosis while therapeutic on anticoagulation for previous DVT and a IVC filter was placed for this. 02/10/2015 -- he did have his vascular test done on Friday but we have no reports yet. 02/17/2015 -- notes were reviewed from the vascular office and the patient had a venous ultrasound done which revealed that he had no reflux in the greater saphenous vein or the short saphenous vein bilaterally. He did have subacute DVT in the common femoral vein and popliteal veins on the right and left side. The recommendation was to continue with Unna's boot therapy at the wound clinic and then to wear graduated compression stockings once the ulcers healed and later if he had continuous problems lymphedema pump would benefit him. 03/17/2015 -- we have applied for his insurance and aide regarding cellular tissue-based products and are [name], [name] ([id number]) still awaiting the final clearance. 03/24/2015 -- he has had Apligraf authorized for him but his wound is looking so good today that we may not use it. 03/31/2015 -- he has not yet received his compression stockings though we have called a couple of times and hopefully they should arrive this week. READMISSION 01/06/16; this is a patient we  have previously cared for in this clinic with wounds on his right medial ankle. I was not previously involved with his care. 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 He wears compression stockings. He had reflux studies done during his last stay in this clinic which did not show significant reflux in the greater or lesser saphenous veins bilaterally. His history is that he developed a open sore on the left medial malleolus one week ago. He was seen  in his primary physician office and given a course of doxycycline which he still should be on. Previously seen vascular surgery who felt that he had some degree of lymphedema as well. He is not a diabetic 01/13/16 no major change 01/20/16; very small wound on the medial right ankle again covered with surface slough that doesn't seem to be spotting the Prisma 01/27/16; patient comes in today complaining of a lot of [name] around the wound site. He has not been systemically unwell. 02/03/16; the patient's wound culture last week grew Proteus, I had empirically given doxycycline. The Proteus was not specifically plated against doxycycline however Proteus itself was fairly pansensitive and the patient comes back feeling a lot better today. I think the doxycycline was likely to be successful in sufficient 02/10/16; as predicted last week the area has closed over. These are probably venous insufficiency wounds although his previous reflux studies did not show superficial reflux. He also has a history of DVT and at one time had a Greenfield filter in place. The area in question on his left medial ankle region. It became secondarily infected but responded nicely to antibiotics. He is closed today 02/17/16 unfortunately patient's venous wound on the medial aspect of his right ankle at this point in time has reopened. He has been using some compression hose which appear to  be very light that he purchased he tells me out of a magazine. He seems a little frustrated with the fact that this has reopened and is concerned about his left lower extremity possibly reopening as well. 02/25/16 patient presents today for follow-up evaluation regarding his right ankle wound. Currently he shows no interval signs or symptoms of infection. We have been compression wrapping him unfortunately the wraps that we had on him last week and he has a significant amount of swelling above whether this had slipped down to. He also notes that he's been having some burning as well at the wound site. He rates his discomfort at this point in time to be a 2-3 out of 10. Otherwise he has no other worsening symptoms. Objective Constitutional Well-nourished and well-hydrated in no acute distress. Pry, [name] ([id number]) Vitals Time Taken: 8:45 AM, Height: 74 in, Weight: 240 lbs, BMI: 30.8, Temperature: 97.6 F, Pulse: 58 bpm, Respiratory Rate: 18 breaths/min, Blood Pressure: 150/94 mmHg. Respiratory normal breathing without difficulty. Cardiovascular bilateral lower extremity lymphedema and patient's right lower extremity edema is significantly worse above where the wrap slipped down over the past week. Psychiatric this patient is able to make decisions and demonstrates good insight into disease process. Alert and Oriented x 3. pleasant and cooperative. Integumentary (Hair, Skin) Wound #3 status is Open. Original cause of wound was Gradually Appeared. The wound is located on the Right Lower Leg. The wound measures 6cm length x 1.2cm width x 0.2cm depth; 5.655cm^2 area and 1.131cm^3 volume. The wound is limited to skin breakdown. There is no tunneling or undermining noted. There is a large amount of serosanguineous drainage noted. The wound margin is flat and intact. There is medium (34-66%) red granulation within the wound bed. There is a medium (34-66%) amount of necrotic tissue  within the wound bed including Adherent Slough. The periwound skin appearance exhibited: Induration, Scarring. The periwound skin appearance did not exhibit: Callus, Crepitus, Excoriation, Fluctuance, Friable, Localized Edema, Rash, Dry/Scaly, Maceration, Moist, Atrophie Blanche, Cyanosis, Ecchymosis, Hemosiderin Staining, Mottled, Pallor, Rubor, Erythema. Assessment Active Problems ICD-10 L97.321 - Non-pressure chronic ulcer of left ankle limited to breakdown of skin I87.332 -  Chronic venous hypertension (idiopathic) with ulcer and inflammation of left lower extremity Procedures Wound #3 Wound #3 is a Venous Leg Ulcer located on the Right Lower Leg . There was a Skin/Subcutaneous Tissue Debridement ([id number]) debridement with total area of 7.2 sq cm performed by Mark III, Keeven Matty. with the following instrument(s): Curette to remove Viable and Non-Viable tissue/material including Exudate, [place], [place] ([id number]) Fibrin/Slough, and Subcutaneous after achieving [name] control using Lidocaine 4% Topical Solution. A time out was conducted at 09:11, prior to the start of the procedure. A Minimum amount of bleeding was controlled with Pressure. The procedure was tolerated well with a [name] level of 0 throughout and a [name] level of 0 following the procedure. Post Debridement Measurements: 6cm length x 1.2cm width x 0.2cm depth; 1.131cm^3 volume. Character of Wound/Ulcer Post Debridement requires further debridement. Severity of Tissue Post Debridement is: Fat layer exposed. Post procedure Diagnosis Wound #3: Same as Pre-Procedure Plan Wound Cleansing: Wound #3 Right Lower Leg: Clean wound with Normal Saline. Anesthetic: Wound #3 Right Lower Leg: Topical Lidocaine 4% cream applied to wound bed prior to debridement Skin Barriers/Peri-Wound Care: Wound #3 Right Lower Leg: Barrier cream Primary Wound Dressing: Wound #3 Right Lower Leg: Aquacel Ag Secondary Dressing: Wound #3 Right  Lower Leg: ABD pad Dressing Change Frequency: Wound #3 Right Lower Leg: Change dressing every week Follow-up Appointments: Wound #3 Right Lower Leg: Return Appointment in 1 week. Edema Control: Wound #3 Right Lower Leg: 4-Layer Compression System - Right Lower Extremity - unna to anchor Additional Orders / Instructions: Wound #3 Right Lower Leg: Increase protein intake. Follow-Up Appointments: A follow-up appointment should be scheduled. MUJTABA, BOLLIG ([id number]) Medication Reconciliation completed and provided to Patient/Care Provider. At this point in time I was actually able to perform debridement sharply to remove necrotic slough especially over the lower portion of the open wound at this point in time today. He tolerated this procedure without complication and the wound bed looks much cleaner following. We're to continue with the Aquacel Ag at this point in time. We will see him back for evaluation in 1 week and in the interim we are goinng to rewrap his right lower extremity and anchor it at the top to hopefully prevent it from sliding down agaiin. if he has any concerns in the meantime he will contact the office and let us know otherwise we will see him back in one week Electronic Signature(s) Signed: 02/25/2016 4:52:13 PM By: Mark Keeler PA-C Entered By: [name] on 02/25/2016 09:57:11 [name], [name] ([id number]) -------------------------------------------------------------------------------- SuperBill Details Patient Name: [name] Date of Service: 02/25/2016 Medical Record Number: [id number] Patient Account Number: [id number] Date of Birth/Sex: 1950/06/12 (65 y.o. Male) Treating RN: [name] Primary Care Physician: [name] Other Clinician: Referring Physician: Royetta [name] Treating Physician/Extender: Mark III, Preet Perrier Weeks in Treatment: 7 Diagnosis Coding ICD-10 Codes Code Description C14.481 Non-pressure chronic ulcer  of left ankle limited to breakdown of skin Chronic venous hypertension (idiopathic) with ulcer and inflammation of left lower I87.332 extremity Facility Procedures CPT4: Description Modifier Quantity Code [id number] 11042 - DEB SUBQ TISSUE 20 SQ CM/< 1 ICD-10 Description Diagnosis L97.321 Non-pressure chronic ulcer of left ankle limited to breakdown of skin I87.332 Chronic venous hypertension (idiopathic) with ulcer  and inflammation of left lower extremity Physician Procedures CPT4: Description Modifier Quantity Code [id number] - WC PHYS SUBQ TISS 20 SQ CM 1 ICD-10 Description Diagnosis L97.321 Non-pressure chronic ulcer of left ankle limited to breakdown of skin I87.332 Chronic venous hypertension (  idiopathic) with ulcer  and inflammation of left lower extremity Electronic Signature(s) Signed: 02/25/2016 4:52:13 PM By: Mark Keeler PA-C Entered By: [name] on 02/25/2016 09:57:26

## 2016-03-03 ENCOUNTER — Encounter: Payer: Medicare Other | Admitting: Internal Medicine

## 2016-03-03 ENCOUNTER — Other Ambulatory Visit
Admission: RE | Admit: 2016-03-03 | Discharge: 2016-03-03 | Disposition: A | Discharge status: Home / Self Care | Payer: Medicare Other | Source: Ambulatory Visit | Attending: Internal Medicine | Admitting: Internal Medicine

## 2016-03-03 DIAGNOSIS — S81801A Unspecified open wound, right lower leg, initial encounter: Secondary | ICD-10-CM | POA: Diagnosis present

## 2016-03-03 DIAGNOSIS — I87332 Chronic venous hypertension (idiopathic) with ulcer and inflammation of left lower extremity: Secondary | ICD-10-CM | POA: Diagnosis not present

## 2016-03-04 NOTE — Progress Notes (Signed)
[name], [name] ([id number]) Visit Report for 03/03/2016 Arrival Information Details Godek, Date of Service: 03/03/2016 8:00 AM Patient Name: [name] Patient Account Number: [id number] Medical Record Treating RN: Ahmed Prima [id number] Number: Other Clinician: Date of Birth/Sex: 12-Jun-1950 (65 y.o. Male) Treating Dellia Nims, MICHAEL Primary Care Physician/Extender: Tonye Pearson Physician: Referring Physician: Doristine Locks in Treatment: 8 Visit Information History Since Last Visit All ordered tests and consults were completed: No Patient Arrived: Ambulatory Added or deleted any medications: No Arrival Time: 08:05 Any new allergies or adverse reactions: No Accompanied By: self Had a fall or experienced change in No Transfer Assistance: None activities of daily living that may affect Patient Identification Verified: Yes risk of falls: Secondary Verification Process Yes Signs or symptoms of abuse/neglect since last No Completed: visito Patient Requires Transmission- No Hospitalized since last visit: No Based Precautions: Pain Present Now: No Patient Has Alerts: Yes Patient Alerts: Patient on Blood Thinner warfarin Electronic Signature(s) Signed: 03/03/2016 5:13:26 PM By: Alric Quan Entered By: Alric Quan on 03/03/2016 08:08:49 Joubert, [name] ([id number]) -------------------------------------------------------------------------------- Encounter Discharge Information Details Holloway, Date of Service: 03/03/2016 8:00 AM Patient Name: [name] Patient Account Number: [id number] Medical Record Treating RN: Ahmed Prima [id number] Number: Other Clinician: Date of Birth/Sex: November 04, 1950 (66 y.o. Male) Treating [name], MICHAEL Primary Care Physician/Extender: Tonye Pearson Physician: Referring Physician: Doristine Locks in Treatment: 8 Encounter Discharge Information Items Discharge Pain Level: 0 Discharge Condition:  Stable Ambulatory Status: Ambulatory Discharge Destination: Home Transportation: Private Auto Accompanied By: self Schedule Follow-up Appointment: Yes Medication Reconciliation completed and provided to Patient/Care Yes [name]: Provided on Clinical Summary of Care: 03/03/2016 Form Type Recipient Paper Patient WD Electronic Signature(s) Signed: 03/03/2016 8:51:51 AM By: Ruthine Dose Entered By: Ruthine Dose on 03/03/2016 08:51:51 Larouche, [name] ([id number]) -------------------------------------------------------------------------------- Lower Extremity Assessment Details Groner, Date of Service: 03/03/2016 8:00 AM Patient Name: [name] Patient Account Number: [id number] Medical Record Treating RN: Ahmed Prima [id number] Number: Other Clinician: Date of Birth/Sex: [birth year] (65 y.o. Male) Treating [name], MICHAEL Primary Care Physician/Extender: Tonye Pearson Physician: Referring Physician: Doristine Locks in Treatment: 8 Edema Assessment Assessed: [Left: No] [Right: No] E[Left: dema] [Right: :] Calf Left: Right: Point of Measurement: 36 cm From Medial Instep cm 43.5 cm Ankle Left: Right: Point of Measurement: 14 cm From Medial Instep cm 28.2 cm Vascular Assessment Pulses: Posterior Tibial Dorsalis Pedis Palpable: [Right:Yes] Extremity colors, hair growth, and conditions: Extremity Color: [Right:Hyperpigmented] Temperature of Extremity: [Right:Warm] Capillary Refill: [Right:< 3 seconds] Toe Nail Assessment Left: Right: Thick: Yes Discolored: Yes Deformed: No Improper Length and Hygiene: No Electronic Signature(s) Signed: 03/03/2016 5:13:26 PM By: Alric Quan Entered By: Alric Quan on 03/03/2016 08:17:22 Wolz, [name] ([id number]) Stfort, [name] ([id number]) -------------------------------------------------------------------------------- Multi Wound Chart Details Kimbrough, Date of Service: 03/03/2016 8:00  AM Patient Name: [name] Patient Account Number: [id number] Medical Record Treating RN: Ahmed Prima [id number] Number: Other Clinician: Date of Birth/Sex: 1950/08/12 (65 y.o. Male) Treating [name], MICHAEL Primary Care Physician/Extender: Tonye Pearson Physician: Referring Physician: Doristine Locks in Treatment: 8 Vital Signs Height(in): 74 Pulse(bpm): 58 Weight(lbs): 240 Blood Pressure 139/89 (mmHg): Body Mass Index(BMI): 31 Temperature(F): 97.7 Respiratory Rate 18 (breaths/min): Photos: [3:No Photos] [N/A:N/A] Wound Location: [3:Right Lower Leg] [N/A:N/A] Wounding Event: [3:Gradually Appeared] [N/A:N/A] Primary Etiology: [3:Venous Leg Ulcer] [N/A:N/A] Comorbid History: [3:Arrhythmia, Hypertension, N/A Gout, Neuropathy] Date Acquired: [3:02/11/2016] [N/A:N/A] Weeks of Treatment: [3:2] [N/A:N/A] Wound Status: [3:Open] [N/A:N/A] Clustered Wound: [3:Yes] [N/A:N/A] Measurements L x W x D 7.4x1.2x0.2 [N/A:N/A] (cm) Area (cm) : [3:6.974] [N/A:N/A] Volume (cm) : [3:1.395] [  N/A:N/A] % Reduction in Area: [3:-27.60%] [N/A:N/A] % Reduction in Volume: -155.00% [N/A:N/A] Classification: [3:Full Thickness Without Exposed Support Structures] [N/A:N/A] Exudate Amount: [3:Large] [N/A:N/A] Exudate Type: [3:Purulent] [N/A:N/A] Exudate Color: [3:yellow, brown, green] [N/A:N/A] Foul Odor After [3:Yes] [N/A:N/A] Cleansing: Odor Anticipated Due to No [N/A:N/A] Product Use: Wound Margin: [3:Flat and Intact] [N/A:N/A] Granulation Amount: Medium (34-66%) N/A N/A Granulation Quality: Red N/A N/A Necrotic Amount: Medium (34-66%) N/A N/A Exposed Structures: Fascia: No N/A N/A Fat: No Tendon: No Muscle: No Joint: No Bone: No Limited to Skin Breakdown Epithelialization: Small (1-33%) N/A N/A Periwound Skin Texture: Induration: Yes N/A N/A Scarring: Yes Edema: No Excoriation: No Callus: No Crepitus: No Fluctuance: No Friable: No Rash: No Periwound Skin Maceration:  No N/A N/A Moisture: Moist: No Dry/Scaly: No Periwound Skin Color: Atrophie Blanche: No N/A N/A Cyanosis: No Ecchymosis: No Erythema: No Hemosiderin Staining: No Mottled: No Pallor: No Rubor: No Temperature: No Abnormality N/A N/A Tenderness on Yes N/A N/A Palpation: Wound Preparation: Ulcer Cleansing: N/A N/A Rinsed/Irrigated with Saline Topical Anesthetic Applied: Other: lidocaine 4% Treatment Notes Electronic Signature(s) Signed: 03/03/2016 5:13:26 PM By: Alric Quan Entered By: Alric Quan on 03/03/2016 08:27:27 Wayment, [name] ([id number]) Fendrick, [name] ([id number]) -------------------------------------------------------------------------------- Multi-Disciplinary Care Plan Details Scheck, Date of Service: 03/03/2016 8:00 AM Patient Name: [name] Patient Account Number: [id number] Medical Record Treating RN: Ahmed Prima [id number] Number: Other Clinician: Date of Birth/Sex: 09-Feb-1951 (65 y.o. Male) Treating [name], MICHAEL Primary Care Physician/Extender: Tonye Pearson Physician: Referring Physician: Doristine Locks in Treatment: 8 Active Inactive Venous Leg Ulcer Nursing Diagnoses: Actual venous Insuffiency (use after diagnosis is confirmed) Goals: Patient will maintain optimal edema control Date Initiated: 02/17/2016 Goal Status: Active Interventions: Compression as ordered Notes: Wound/Skin Impairment Nursing Diagnoses: Impaired tissue integrity Goals: Patient/caregiver will verbalize understanding of skin care regimen Date Initiated: 02/17/2016 Goal Status: Active Ulcer/skin breakdown will have a volume reduction of 30% by week 4 Date Initiated: 02/17/2016 Goal Status: Active Interventions: Assess ulceration(s) every visit Notes: Electronic Signature(s) GIANCARLOS, BERENDT ([id number]) Signed: 03/03/2016 5:13:26 PM By: Alric Quan Entered By: Alric Quan on 03/03/2016 08:27:18 Bertucci, [name]  ([id number]) -------------------------------------------------------------------------------- Pain Assessment Details Tienda, Date of Service: 03/03/2016 8:00 AM Patient Name: [name] Patient Account Number: [id number] Medical Record Treating RN: Ahmed Prima [id number] Number: Other Clinician: Date of Birth/Sex: 1950/08/17 (65 y.o. Male) Treating Dellia Nims, MICHAEL Primary Care Physician/Extender: Tonye Pearson Physician: Referring Physician: Doristine Locks in Treatment: 8 Active Problems Location of Pain Severity and Description of Pain Patient Has Paino No Site Locations With Dressing Change: No Pain Management and Medication Current Pain Management: Electronic Signature(s) Signed: 03/03/2016 5:13:26 PM By: Alric Quan Entered By: Alric Quan on 03/03/2016 08:08:55 Gago, [name] ([id number]) -------------------------------------------------------------------------------- Patient/Caregiver Education Details Schrieber, Date of Service: 03/03/2016 8:00 AM Patient Name: [name], [name] Patient Account Number: [id number] Medical Record Treating RN: Ahmed Prima [id number] Number: Other Clinician: Date of Birth/Gender: 01-14-1951 (65 y.o. Male) Treating [name], MICHAEL Primary Care Physician/Extender: Tonye Pearson Physician: Suella Grove in Treatment: 8 Referring Physician: Royetta Crochet Education Assessment Education Provided To: Patient Education Topics Provided Wound/Skin Impairment: Handouts: Other: change dressing as ordered Methods: Demonstration, Explain/Verbal Responses: State content correctly Electronic Signature(s) Signed: 03/03/2016 5:13:26 PM By: Alric Quan Entered By: Alric Quan on 03/03/2016 08:35:02 [name], [name] ([id number]) -------------------------------------------------------------------------------- Wound Assessment Details Bolding, Date of Service: 03/03/2016 8:00 AM Patient Name: [name] Patient Account  Number: [id number] Medical Record Treating RN: Ahmed Prima [id number] Number: Other Clinician: Date of Birth/Sex: 23-Aug-1950 (65 y.o. Male) Treating [name], MICHAEL Primary Care Physician/Extender: Collie Siad,  ADRIAN Physician: Referring Physician: Doristine Locks in Treatment: 8 Wound Status Wound Number: 3 Primary Venous Leg Ulcer Etiology: Wound Location: Right Lower Leg Wound Status: Open Wounding Event: Gradually Appeared Comorbid Arrhythmia, Hypertension, Gout, Date Acquired: 02/11/2016 History: Neuropathy Weeks Of Treatment: 2 Clustered Wound: Yes Photos Photo Uploaded By: Alric Quan on 03/03/2016 09:14:05 Wound Measurements Length: (cm) 7.4 Width: (cm) 1.2 Depth: (cm) 0.2 Area: (cm) 6.974 Volume: (cm) 1.395 % Reduction in Area: -27.6% % Reduction in Volume: -155% Epithelialization: Small (1-33%) Tunneling: No Undermining: No Wound Description Full Thickness Without Exposed Classification: Support Structures Wound Margin: Flat and Intact Exudate Large Amount: Exudate Type: Purulent Exudate Color: yellow, brown, green Foul Odor After Cleansing: Yes Due to Product Use: No Wound Bed [place], Harmon ([id number]) Granulation Amount: Medium (34-66%) Exposed Structure Granulation Quality: Red Fascia Exposed: No Necrotic Amount: Medium (34-66%) Fat Layer Exposed: No Necrotic Quality: Adherent Slough Tendon Exposed: No Muscle Exposed: No Joint Exposed: No Bone Exposed: No Limited to Skin Breakdown Periwound Skin Texture Texture Color No Abnormalities Noted: No No Abnormalities Noted: No Callus: No Atrophie Blanche: No Crepitus: No Cyanosis: No Excoriation: No Ecchymosis: No Fluctuance: No Erythema: No Friable: No Hemosiderin Staining: No Induration: Yes Mottled: No Localized Edema: No Pallor: No Rash: No Rubor: No Scarring: Yes Temperature / Pain Moisture Temperature: No Abnormality No Abnormalities Noted: No Tenderness  on Palpation: Yes Dry / Scaly: No Maceration: No Moist: No Wound Preparation Ulcer Cleansing: Rinsed/Irrigated with Saline Topical Anesthetic Applied: Other: lidocaine 4%, Treatment Notes Wound #3 (Right Lower Leg) 1. Cleansed with: Clean wound with Normal Saline Cleanse wound with antibacterial soap and water 2. Anesthetic Topical Lidocaine 4% cream to wound bed prior to debridement 3. Peri-wound Care: Skin Prep 5. Secondary Dressing Applied ABD Pad Dry Gauze 7. Secured with Tape 4-Layer Compression System - Right Lower Extremity Weideman, Rufino ([id number]) Notes unna to anchor, silver alginate, charcoal Electronic Signature(s) Signed: 03/03/2016 5:13:26 PM By: Alric Quan Entered By: Alric Quan on 03/03/2016 08:21:35 Slattery, [name] ([id number]) -------------------------------------------------------------------------------- Vitals Details Troeger, Date of Service: 03/03/2016 8:00 AM Patient Name: [name] Patient Account Number: [id number] Medical Record Treating RN: Ahmed Prima [id number] Number: Other Clinician: Date of Birth/Sex: 1950/10/12 (65 y.o. Male) Treating [name], MICHAEL Primary Care Physician/Extender: Tonye Pearson Physician: Referring Physician: Doristine Locks in Treatment: 8 Vital Signs Time Taken: 08:08 Temperature (F): 97.7 Height (in): 74 Pulse (bpm): 58 Weight (lbs): 240 Respiratory Rate (breaths/min): 18 Body Mass Index (BMI): 30.8 Blood Pressure (mmHg): 139/89 Reference Range: 80 - 120 mg / dl Electronic Signature(s) Signed: 03/03/2016 5:13:26 PM By: Alric Quan Entered By: Alric Quan on 03/03/2016 08:09:41

## 2016-03-04 NOTE — Progress Notes (Signed)
ZAID, TOMES ([id number]) Visit Report for 03/03/2016 Chief Complaint Document Details Price, Date of Service: 03/03/2016 8:00 AM Patient Name: [facility] Patient Account Number: [id number] Medical Record Treating RN: Ahmed Prima [id number] Number: Other Clinician: Date of Birth/Sex: 11/22/1950 (65 y.o. Male) Treating Dellia Nims, Doyal Saric Primary Care Physician/Extender: Tonye Pearson Physician: Referring Physician: Doristine Locks in Treatment: 8 Information Obtained from: Patient Chief Complaint Patient follows up today due to reopening of the left medial venous ulcer Electronic Signature(s) Signed: 03/03/2016 5:07:12 PM By: [name] MD Entered By: [name] on 03/03/2016 08:55:15 Hefley, [name] ([id number]) -------------------------------------------------------------------------------- Debridement Details Kegg, Date of Service: 03/03/2016 8:00 AM Patient Name: [facility] Patient Account Number: [id number] Medical Record Treating RN: Ahmed Prima [id number] Number: Other Clinician: Date of Birth/Sex: February 10, 1951 (65 y.o. Male) Treating Maxtyn Nuzum Primary Care Physician/Extender: Tonye Pearson Physician: Referring Physician: Doristine Locks in Treatment: 8 Debridement Performed for Wound #3 Right Lower Leg Assessment: Performed By: Physician Ricard Dillon, MD Debridement: Debridement Pre-procedure Yes - 08:26 Verification/Time Out Taken: Start Time: 08:27 Pain Control: Lidocaine 4% Topical Solution Level: Skin/Subcutaneous Tissue Total Area Debrided (L x 7.4 (cm) x 1.2 (cm) = 8.88 (cm) W): Tissue and other Viable, Non-Viable, Exudate, Fibrin/Slough, Subcutaneous material debrided: Instrument: Curette Specimen: Swab Number of Specimens 1 Taken: Bleeding: Minimum Hemostasis Achieved: Pressure End Time: 08:29 Procedural Pain: 0 Post Procedural Pain: 0 Response to Treatment: Procedure was tolerated well Post  Debridement Measurements of Total Wound Length: (cm) 7.4 Width: (cm) 1.2 Depth: (cm) 0.2 Volume: (cm) 1.395 Character of Wound/Ulcer Post Requires Further Debridement Debridement: Severity of Tissue Post Debridement: Fat layer exposed Post Procedure Diagnosis Same as Pre-procedure Zahner, [name] ([id number]) Electronic Signature(s) Signed: 03/03/2016 5:07:12 PM By: [name] MD Signed: 03/03/2016 5:13:26 PM By: Alric Quan Entered By: [name] on 03/03/2016 08:55:02 Menard, [name] ([id number]) -------------------------------------------------------------------------------- HPI Details Liou, Date of Service: 03/03/2016 8:00 AM Patient Name: [facility] Patient Account Number: [id number] Medical Record Treating RN: Ahmed Prima [id number] Number: Other Clinician: Date of Birth/Sex: 12-19-1950 (65 y.o. Male) Treating Auri [name] Primary Care Physician/Extender: Tonye Pearson Physician: Referring Physician: Doristine Locks in Treatment: 8 History of Present Illness Location: open wound just above his right ankle medially Quality: Patient tells me he is not having a significant amount of pain at this point in time. Severity: 1 out of 10 Duration: This just reopened in the past week. Timing: Pain in wound is Intermittent Context: The wound appeared gradually over time Modifying Factors: Consults to this date include: he was seen in the ER and was referred to a vascular surgeon but the patient has not done that. He may have been treated with clindamycin in the ER. Associated Signs and Symptoms: Patient reports having difficulty standing for long periods. HPI Description: 65 year old gentleman who was seen in the emergency department recently on 01/06/2015 for a wound of his right lower extremity which he says was not involving any injury and he did not know how he sustained it. He had draining foul-smelling liquid from the area and had  gone for care there. his past medical history is significant for DVT, hypertension, gout, tobacco abuse, cocaine abuse, stroke, atrial fibrillation, pulmonary embolism. he has also had some vascular surgery with a stent placed in his leg. He has been a smoker for many years and has given up straight drugs several years ago. He continues to smoke about 4-5 cigarettes a day. 02/03/2015 -- received a note from 05/14/2013 where Dr. Leotis Pain placed an inferior vena  cava filter. The patient had a deep vein thrombosis while therapeutic on anticoagulation for previous DVT and a IVC filter was placed for this. 02/10/2015 -- he did have his vascular test done on Friday but we have no reports yet. 02/17/2015 -- notes were reviewed from the vascular office and the patient had a venous ultrasound done which revealed that he had no reflux in the greater saphenous vein or the short saphenous vein bilaterally. He did have subacute DVT in the common femoral vein and popliteal veins on the right and left side. The recommendation was to continue with Unna's boot therapy at the wound clinic and then to wear graduated compression stockings once the ulcers healed and later if he had continuous problems lymphedema pump would benefit him. 03/17/2015 -- we have applied for his insurance and aide regarding cellular tissue-based products and are still awaiting the final clearance. 03/24/2015 -- he has had Apligraf authorized for him but his wound is looking so good today that we may not use it. 03/31/2015 -- he has not yet received his compression stockings though we have called a couple of times and hopefully they should arrive this week. ADITYA, NASTASI ([id number]) [place] 01/06/16; this is a patient we have previously cared for in this clinic with wounds on his right medial ankle. I was not previously involved with his care. He has a history of DVT and is on chronic Coumadin and one point had an inferior  vena cava filter I'm not sure if that is still in place. He wears compression stockings. He had reflux studies done during his last stay in this clinic which did not show significant reflux in the greater or lesser saphenous veins bilaterally. His history is that he developed a open sore on the left medial malleolus one week ago. He was seen in his primary physician office and given a course of doxycycline which he still should be on. Previously seen vascular surgery who felt that he had some degree of lymphedema as well. He is not a diabetic 01/13/16 no major change 01/20/16; very small wound on the medial right ankle again covered with surface slough that doesn't seem to be spotting the Prisma 01/27/16; patient comes in today complaining of a lot of pain around the wound site. He has not been systemically unwell. 02/03/16; the patient's wound culture last week grew Proteus, I had empirically given doxycycline. The Proteus was not specifically plated against doxycycline however Proteus itself was fairly pansensitive and the patient comes back feeling a lot better today. I think the doxycycline was likely to be successful in sufficient 02/10/16; as predicted last week the area has closed over. These are probably venous insufficiency wounds although his previous reflux studies did not show superficial reflux. He also has a history of DVT and at one time had a Greenfield filter in place. The area in question on his left medial ankle region. It became secondarily infected but responded nicely to antibiotics. He is closed today 02/17/16 unfortunately patient's venous wound on the medial aspect of his right ankle at this point in time has reopened. He has been using some compression hose which appear to be very light that he purchased he tells me out of a magazine. He seems a little frustrated with the fact that this has reopened and is concerned about his left lower extremity possibly reopening as  well. 02/25/16 patient presents today for follow-up evaluation regarding his right ankle wound. Currently he shows no interval signs or symptoms of  infection. We have been compression wrapping him unfortunately the wraps that we had on him last week and he has a significant amount of swelling above whether this had slipped down to. He also notes that he's been having some burning as well at the wound site. He rates his discomfort at this point in time to be a 2-3 out of 10. Otherwise he has no other worsening symptoms. 03/03/16; this is a patient that had a wound on his left medial ankle that I discharged on 02/10/16. He apparently reappeared the next week with open areas on his right medial ankle. Her intake nurse reports today that he has a lot of drainage and odor at intake even after the wound was cleaned. Also of note the patient complains of edema in the left leg and showed up with only one of the 2 layer compression system. Electronic Signature(s) Signed: 03/03/2016 5:07:12 PM By: [name] MD Entered By: [name] on 03/03/2016 14:18:27 Fennell, [name] ([id number]) -------------------------------------------------------------------------------- Physical Exam Details Howington, Date of Service: 03/03/2016 8:00 AM Patient Name: [facility] Patient Account Number: [id number] Medical Record Treating RN: Ahmed Prima [id number] Number: Other Clinician: Date of Birth/Sex: 09/09/1950 (65 y.o. Male) Treating Jamaurie Bernier Primary Care Physician/Extender: Tonye Pearson Physician: Referring Physician: Doristine Locks in Treatment: 8 Constitutional Sitting or standing Blood Pressure is within target range for patient.. Pulse regular and within target range for patient.[name] Respirations regular, non-labored and within target range.. Temperature is normal and within the target range for the patient.. Patient's appearance is neat and clean. Appears in no acute distress.  Well nourished and well developed.. Notes Wound exam; now on the right medial ankle. 3 small open areas. All covered in adherent surface slough. The larger middle 1 also had greenish discoloration. I cultured this area. Some surrounding tenderness noted on palpation. The odor was noticeable LEFT ankle; this remains closed there was no noticeable edema however he was not wearing the correct impression cyst Electronic Signature(s) Signed: 03/03/2016 5:07:12 PM By: [name] MD Entered By: [name] on 03/03/2016 08:58:24 Neu, [name] ([id number]) -------------------------------------------------------------------------------- Physician Orders Details Kehm, Date of Service: 03/03/2016 8:00 AM Patient Name: [facility] Patient Account Number: [id number] Medical Record Treating RN: Ahmed Prima [id number] Number: Other Clinician: Date of Birth/Sex: 1951/02/11 (65 y.o. Male) Treating Treasa Bradshaw Primary Care Physician/Extender: Tonye Pearson Physician: Referring Physician: Doristine Locks in Treatment: 8 Verbal / Phone Orders: Yes Clinician: Pinkerton, Debi Read Back and Verified: Yes Diagnosis Coding Wound Cleansing Wound #3 Right Lower Leg o Clean wound with Normal Saline. o Cleanse wound with mild soap and water - at clinic Anesthetic Wound #3 Right Lower Leg o Topical Lidocaine 4% cream applied to wound bed prior to debridement Skin Barriers/Peri-Wound Care Wound #3 Right Lower Leg o Barrier cream Primary Wound Dressing Wound #3 Right Lower Leg o Aquacel Ag Secondary Dressing Wound #3 Right Lower Leg o ABD pad - charcoal o Dry Gauze Dressing Change Frequency Wound #3 Right Lower Leg o Change dressing every week Follow-up Appointments Wound #3 Right Lower Leg o Return Appointment in 1 week. Edema Control Wound #3 Right Lower Leg Duling, Angell ([id number]) o 4-Layer Compression System - Right Lower Extremity  - unna to anchor Additional Orders / Instructions Wound #3 Right Lower Leg o Increase protein intake. Medications-please add to medication list. Wound #3 Right Lower Leg o P.O. Antibiotics - Doxycycline Laboratory o Bacteria identified in Wound by Culture (MICRO) - right lower leg oooo LOINC Code: 6462-6 oooo Convenience  Name: Wound culture routine Patient Medications Allergies: No Known Allergies Notifications Medication Indication Start End doxycycline hyclate 03/03/2016 DOSE 1 - oral 100 mg capsule - 1 capsule oral BID x7days Electronic Signature(s) Signed: 03/03/2016 5:07:12 PM By: [name] MD Signed: 03/03/2016 5:13:26 PM By: Alric Quan Entered By: Alric Quan on 03/03/2016 08:32:33 Kretsch, [name] ([id number]) -------------------------------------------------------------------------------- Prescription 03/03/2016 Patient Name: [name] Physician: Ricard Dillon MD Date of Birth: 06/09/1950 NPI#: [id number] Sex: M DEA#: [id number] Phone #: [phone] License #: [id number] Patient Address: [place], [place] [address] [address] [address], [place], [place] [address] [phone] Allergies No Known Allergies Medication Medication: Route: Strength: Form: doxycycline hyclate oral 100 mg capsule Class: PERIODONTAL COLLAGENASE INHIBITORS Dose: Frequency / Time: Indication: 1 1 capsule oral BID x7days Number of Refills: Number of Units: 0 Generic Substitution: Start Date: End Date: Administered at Substitution Permitted 36/14/4315 Facility: No Note to Pharmacy: [facility]): Date(s): Electronic Signature(s) FRISCO, CORDTS ([id number]) Signed: 03/03/2016 5:07:12 PM By: [name] MD Entered By: [name] on 03/03/2016 09:02:43 [place]dale, [name]  ([id number]) --------------------------------------------------------------------------------  Problem List Details Komar, Date of Service: 03/03/2016 8:00 AM Patient Name: [facility] Patient Account Number: [id number] Medical Record Treating RN: Ahmed Prima [id number] Number: Other Clinician: Date of Birth/Sex: [birth year] (65 y.o. 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 to 03/03/2016 Yes breakdown of skin I87.331 Chronic venous hypertension (idiopathic) with ulcer and 03/03/2016 Yes inflammation of right lower extremity Inactive Problems Resolved Problems ICD-10 Code Description Active Date Resolved Date L97.321 Non-pressure chronic ulcer of left ankle limited to 01/06/2016 01/06/2016 breakdown of skin I87.332 Chronic venous hypertension (idiopathic) with ulcer and 01/06/2016 01/06/2016 inflammation of left lower extremity Electronic Signature(s) Signed: 03/03/2016 5:07:12 PM By: [name] MD Entered By: [name] on 03/03/2016 09:02:04 [place], [name] ([id number]) -------------------------------------------------------------------------------- Progress Note Details Mierzwa, Date of Service: 03/03/2016 8:00 AM Patient Name: [name] Patient Account Number: [id number] Medical Record Treating RN: Ahmed Prima [id number] Number: Other Clinician: Date of Birth/Sex: 07-02-1950 (65 y.o. Male) Treating Dellia Nims, Baylie Drakes Primary Care Physician/Extender: Tonye Pearson Physician: Referring Physician: Doristine Locks in Treatment: 8 Subjective Chief Complaint Information obtained from Patient Patient follows up today due to reopening of the left medial venous ulcer History of Present Illness (HPI) The following HPI  elements were documented for the patient's wound: Location: open wound just above his right ankle medially Quality: Patient tells me he is not having a significant amount of pain at this point in time. Severity: 1 out of 10 Duration: This just reopened in the past week. Timing: Pain in wound is Intermittent Context: The wound appeared gradually over time Modifying Factors: Consults to this date include: he was seen in the ER and was referred to a vascular surgeon but the patient has not done that. He may have been treated with clindamycin in the ER. Associated Signs and Symptoms: Patient reports having difficulty standing for long periods. 65 year old gentleman who was seen in the emergency department recently on 01/06/2015 for a wound of his right lower extremity which he says was not involving any injury and he did not know how he sustained it. He had draining foul-smelling liquid from the area and had gone for care there. his past medical history is significant for DVT, hypertension, gout, tobacco abuse, cocaine  abuse, stroke, atrial fibrillation, pulmonary embolism. he has also had some vascular surgery with a stent placed in his leg. He has been a smoker for many years and has given up straight drugs several years ago. He continues to smoke about 4-5 cigarettes a day. 02/03/2015 -- received a note from 05/14/2013 where Dr. Leotis Pain placed an inferior vena cava filter. The patient had a deep vein thrombosis while therapeutic on anticoagulation for previous DVT and a IVC filter was placed for this. 02/10/2015 -- he did have his vascular test done on Friday but we have no reports yet. 02/17/2015 -- notes were reviewed from the vascular office and the patient had a venous ultrasound done which revealed that he had no reflux in the greater saphenous vein or the short saphenous vein bilaterally. He did have subacute DVT in the common femoral vein and popliteal veins on the right and left  side. The recommendation was to continue with Unna's boot therapy at the wound clinic and then to wear graduated compression stockings once the ulcers healed and later if he had continuous problems lymphedema pump would benefit him. BENSEN, CHADDERDON ([id number]) 03/17/2015 -- we have applied for his insurance and aide regarding cellular tissue-based products and are still awaiting the final clearance. 03/24/2015 -- he has had Apligraf authorized for him but his wound is looking so good today that we may not use it. 03/31/2015 -- he has not yet received his compression stockings though we have called a couple of times and hopefully they should arrive this week. READMISSION 01/06/16; this is a patient we have previously cared for in this clinic with wounds on his right medial ankle. I was not previously involved with his care. 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 He wears compression stockings. He had reflux studies done during his last stay in this clinic which did not show significant reflux in the greater or lesser saphenous veins bilaterally. His history is that he developed a open sore on the left medial malleolus one week ago. He was seen in his primary physician office and given a course of doxycycline which he still should be on. Previously seen vascular surgery who felt that he had some degree of lymphedema as well. He is not a diabetic 01/13/16 no major change 01/20/16; very small wound on the medial right ankle again covered with surface slough that doesn't seem to be spotting the Prisma 01/27/16; patient comes in today complaining of a lot of pain around the wound site. He has not been systemically unwell. 02/03/16; the patient's wound culture last week grew Proteus, I had empirically given doxycycline. The Proteus was not specifically plated against doxycycline however Proteus itself  was fairly pansensitive and the patient comes back feeling a lot better today. I think the doxycycline was likely to be successful in sufficient 02/10/16; as predicted last week the area has closed over. These are probably venous insufficiency wounds although his previous reflux studies did not show superficial reflux. He also has a history of DVT and at one time had a Greenfield filter in place. The area in question on his left medial ankle region. It became secondarily infected but responded nicely to antibiotics. He is closed today 02/17/16 unfortunately patient's venous wound on the medial aspect of his right ankle at this point in time has reopened. He has been using some compression hose  which appear to be very light that he purchased he tells me out of a magazine. He seems a little frustrated with the fact that this has reopened and is concerned about his left lower extremity possibly reopening as well. 02/25/16 patient presents today for follow-up evaluation regarding his right ankle wound. Currently he shows no interval signs or symptoms of infection. We have been compression wrapping him unfortunately the wraps that we had on him last week and he has a significant amount of swelling above whether this had slipped down to. He also notes that he's been having some burning as well at the wound site. He rates his discomfort at this point in time to be a 2-3 out of 10. Otherwise he has no other worsening symptoms. 03/02/16; this is a patient that had a wound on his left medial ankle that I discharged on 02/10/16. He apparently reappeared the next week with open areas on his right medial ankle. Her intake nurse reports today that he has a lot of drainage and odor at intake even after the wound was cleaned. Also of note the patient complains of edema in the left leg and showed up with only one of the 2 layer compression system. Objective Bohnenkamp, Kearney ([id number]) Constitutional Sitting  or standing Blood Pressure is within target range for patient.. Pulse regular and within target range for patient.[name] Respirations regular, non-labored and within target range.. Temperature is normal and within the target range for the patient.. Patient's appearance is neat and clean. Appears in no acute distress. Well nourished and well developed.. Vitals Time Taken: 8:08 AM, Height: 74 in, Weight: 240 lbs, BMI: 30.8, Temperature: 97.7 F, Pulse: 58 bpm, Respiratory Rate: 18 breaths/min, Blood Pressure: 139/89 mmHg. General Notes: Wound exam; now on the right medial ankle. 3 small open areas. All covered in adherent surface slough. The larger middle 1 also had greenish discoloration. I cultured this area. Some surrounding tenderness noted on palpation. The odor was noticeable LEFT ankle; this remains closed there was no noticeable edema however he was not wearing the correct impression cyst Integumentary (Hair, Skin) Wound #3 status is Open. Original cause of wound was Gradually Appeared. The wound is located on the Right Lower Leg. The wound measures 7.4cm length x 1.2cm width x 0.2cm depth; 6.974cm^2 area and 1.395cm^3 volume. The wound is limited to skin breakdown. There is no tunneling or undermining noted. There is a large amount of purulent drainage noted. The wound margin is flat and intact. There is medium (34-66%) red granulation within the wound bed. There is a medium (34-66%) amount of necrotic tissue within the wound bed including Adherent Slough. The periwound skin appearance exhibited: Induration, Scarring. The periwound skin appearance did not exhibit: Callus, Crepitus, Excoriation, Fluctuance, Friable, Localized Edema, Rash, Dry/Scaly, Maceration, Moist, Atrophie Blanche, Cyanosis, Ecchymosis, Hemosiderin Staining, Mottled, Pallor, Rubor, Erythema. Periwound temperature was noted as No Abnormality. The periwound has tenderness on palpation. Assessment Active  Problems ICD-10 L97.321 - Non-pressure chronic ulcer of left ankle limited to breakdown of skin I87.332 - Chronic venous hypertension (idiopathic) with ulcer and inflammation of left lower extremity Procedures Wound #3 Wound #3 is a Venous Leg Ulcer located on the Right Lower Leg . There was a Skin/Subcutaneous Tissue Armistead, Eural ([id number]) Debridement ([id number]) debridement with total area of 8.88 sq cm performed by Ricard Dillon, MD. with the following instrument(s): Curette to remove Viable and Non-Viable tissue/material including Exudate, Fibrin/Slough, and Subcutaneous after achieving pain control using Lidocaine 4% Topical Solution. 1  Specimen was taken by a Swab and sent to the lab per facility protocol.A time out was conducted at 08:26, prior to the start of the procedure. A Minimum amount of bleeding was controlled with Pressure. The procedure was tolerated well with a pain level of 0 throughout and a pain level of 0 following the procedure. Post Debridement Measurements: 7.4cm length x 1.2cm width x 0.2cm depth; 1.395cm^3 volume. Character of Wound/Ulcer Post Debridement requires further debridement. Severity of Tissue Post Debridement is: Fat layer exposed. Post procedure Diagnosis Wound #3: Same as Pre-Procedure Plan Wound Cleansing: Wound #3 Right Lower Leg: Clean wound with Normal Saline. Cleanse wound with mild soap and water - at clinic Anesthetic: Wound #3 Right Lower Leg: Topical Lidocaine 4% cream applied to wound bed prior to debridement Skin Barriers/Peri-Wound Care: Wound #3 Right Lower Leg: Barrier cream Primary Wound Dressing: Wound #3 Right Lower Leg: Aquacel Ag Secondary Dressing: Wound #3 Right Lower Leg: ABD pad - charcoal Dry Gauze Dressing Change Frequency: Wound #3 Right Lower Leg: Change dressing every week Follow-up Appointments: Wound #3 Right Lower Leg: Return Appointment in 1 week. Edema Control: Wound #3 Right Lower  Leg: 4-Layer Compression System - Right Lower Extremity - unna to anchor Additional Orders / Instructions: Wound #3 Right Lower Leg: Increase protein intake. Medications-please add to medication list.: Wound #3 Right Lower Leg: Abercrombie, Marcin ([id number]) P.O. Antibiotics - Doxycycline Laboratory ordered were: Wound culture routine - right lower leg The following medication(s) was prescribed: doxycycline hyclate oral 100 mg capsule 1 1 capsule oral BID x7days starting 03/03/2016 o #1 wound was debrided with a curet. Hemostasis with direct pressure. #2 dressed with silver alginate under a Profore wrap. #3 empiric doxycycline given while we await a culture of the middle of the 3 open areas. #4 the area on the left ankle that I healed out on 9/26 is still closed however he does not have the correct compression system. He states that he only got one Larodopa 2 layer compression system we will need to look into this Electronic Signature(s) Signed: 03/03/2016 5:07:12 PM By: [name] MD Entered By: [name] on 03/03/2016 09:00:03 Rokosz, [name] ([id number]) -------------------------------------------------------------------------------- SuperBill Details Rawles, Date of Service: 03/03/2016 Patient Name: [facility] Patient Account Number: [id number] Medical Record Treating RN: Ahmed Prima [id number] Number: Other Clinician: Date of Birth/Sex: 18-May-1950 (65 y.o. 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 and inflammation of right lower I87.331 extremity Facility Procedures CPT4: Description Modifier Quantity Code [id number] - DEB SUBQ TISSUE 20 SQ CM/< 1 ICD-10  Description Diagnosis I87.331 Chronic venous hypertension (idiopathic) with ulcer and inflammation of right lower extremity Physician Procedures CPT4: Description Modifier Quantity Code [id number] - WC PHYS SUBQ TISS 20 SQ CM 1 ICD-10 Description Diagnosis I87.331 Chronic venous hypertension (idiopathic) with ulcer and inflammation of right lower extremity Electronic Signature(s) Signed: 03/03/2016 5:07:12 PM By: [name] MD Entered By: [name] on 03/03/2016 09:02:36

## 2016-03-05 ENCOUNTER — Ambulatory Visit: Payer: Medicare Other | Admitting: Surgery

## 2016-03-05 ENCOUNTER — Encounter: Payer: Medicare Other | Admitting: Surgery

## 2016-03-05 DIAGNOSIS — I87332 Chronic venous hypertension (idiopathic) with ulcer and inflammation of left lower extremity: Secondary | ICD-10-CM | POA: Diagnosis not present

## 2016-03-06 LAB — AEROBIC CULTURE  (SUPERFICIAL SPECIMEN)

## 2016-03-06 LAB — AEROBIC CULTURE W GRAM STAIN (SUPERFICIAL SPECIMEN): Gram Stain: NONE SEEN

## 2016-03-06 NOTE — Progress Notes (Signed)
[name], [name] ([id number]) Visit Report for 03/05/2016 Chief Complaint Document Details Jasinski, 03/05/2016 3:45 Patient Name: Date of Service: [facility] PM Medical Record Patient Account Number: [id number] Number: Treating RN: Baruch Gouty, RN, [name], Velva Harman Date of Birth/Sex: 1950-11-23 (65 y.o. Male) Other Clinician: Primary Care Physician: Royetta Crochet Treating Christin Fudge Referring Physician: Royetta Crochet Physician/Extender: Weeks in Treatment: 8 Information Obtained from: Patient Chief Complaint Patient follows up today due to reopening of the left medial venous ulcer Electronic Signature(s) Signed: 03/05/2016 4:35:48 PM By: Christin Fudge MD, FACS Entered By: Christin Fudge on 03/05/2016 16:35:48 [name], [name] ([id number]) -------------------------------------------------------------------------------- HPI Details Schlereth, 03/05/2016 3:45 Patient Name: Date of Service: [name] PM Medical Record Patient Account Number: [id number] Number: Treating RN: Baruch Gouty RN, [name], Velva Harman Date of Birth/Sex: 02/15/1951 (65 y.o. Male) Other Clinician: Primary Care Physician: Joycelyn Das Referring Physician: Royetta Crochet Physician/Extender: Weeks in Treatment: 8 History of Present Illness Location: open wound just above his right ankle medially Quality: Patient tells me he is not having a significant amount of pain at this point in time. Severity: 1 out of 10 Duration: This just reopened in the past week. Timing: Pain in wound is Intermittent Context: The wound appeared gradually over time Modifying Factors: Consults to this date include: he was seen in the ER and was referred to a vascular surgeon but the patient has not done that. He may have been treated with clindamycin in the ER. Associated Signs and Symptoms: Patient reports having difficulty standing for long periods. HPI Description: 65 year old gentleman who was seen in  the emergency department recently on 01/06/2015 for a wound of his right lower extremity which he says was not involving any injury and he did not know how he sustained it. He had draining foul-smelling liquid from the area and had gone for care there. his past medical history is significant for DVT, hypertension, gout, tobacco abuse, cocaine abuse, stroke, atrial fibrillation, pulmonary embolism. he has also had some vascular surgery with a stent placed in his leg. He has been a smoker for many years and has given up straight drugs several years ago. He continues to smoke about 4-5 cigarettes a day. 02/03/2015 -- received a note from 05/14/2013 where Dr. Leotis Pain placed an inferior vena cava filter. The patient had a deep vein thrombosis while therapeutic on anticoagulation for previous DVT and a IVC filter was placed for this. 02/10/2015 -- he did have his vascular test done on Friday but we have no reports yet. 02/17/2015 -- notes were reviewed from the vascular office and the patient had a venous ultrasound done which revealed that he had no reflux in the greater saphenous vein or the short saphenous vein bilaterally. He did have subacute DVT in the common femoral vein and popliteal veins on the right and left side. The recommendation was to continue with Unna's boot therapy at the wound clinic and then to wear graduated compression stockings once the ulcers healed and later if he had continuous problems lymphedema pump would benefit him. 03/17/2015 -- we have applied for his insurance and aide regarding cellular tissue-based products and are still awaiting the final clearance. 03/24/2015 -- he has had Apligraf authorized for him but his wound is looking so good today that we may not use it. 03/31/2015 -- he has not yet received his compression stockings though we have called a couple of times and hopefully they should arrive this week. [name], [name]  ([id number]) 01/06/16; this is a patient we have  previously cared for in this clinic with wounds on his right medial ankle. I was not previously involved with his care. 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 He wears compression stockings. He had reflux studies done during his last stay in this clinic which did not show significant reflux in the greater or lesser saphenous veins bilaterally. His history is that he developed a open sore on the left medial malleolus one week ago. He was seen in his primary physician office and given a course of doxycycline which he still should be on. Previously seen vascular surgery who felt that he had some degree of lymphedema as well. He is not a diabetic 01/13/16 no major change 01/20/16; very small wound on the medial right ankle again covered with surface slough that doesn't seem to be spotting the Prisma 01/27/16; patient comes in today complaining of a lot of pain around the wound site. He has not been systemically unwell. 02/03/16; the patient's wound culture last week grew Proteus, I had empirically given doxycycline. The Proteus was not specifically plated against doxycycline however Proteus itself was fairly pansensitive and the patient comes back feeling a lot better today. I think the doxycycline was likely to be successful in sufficient 02/10/16; as predicted last week the area has closed over. These are probably venous insufficiency wounds although his previous reflux studies did not show superficial reflux. He also has a history of DVT and at one time had a Greenfield filter in place. The area in question on his left medial ankle region. It became secondarily infected but responded nicely to antibiotics. He is closed today 02/17/16 unfortunately patient's venous wound on the medial aspect of his right ankle at this point in time has reopened. He has been  using some compression hose which appear to be very light that he purchased he tells me out of a magazine. He seems a little frustrated with the fact that this has reopened and is concerned about his left lower extremity possibly reopening as well. 02/25/16 patient presents today for follow-up evaluation regarding his right ankle wound. Currently he shows no interval signs or symptoms of infection. We have been compression wrapping him unfortunately the wraps that we had on him last week and he has a significant amount of swelling above whether this had slipped down to. He also notes that he's been having some burning as well at the wound site. He rates his discomfort at this point in time to be a 2-3 out of 10. Otherwise he has no other worsening symptoms. 03/03/16; this is a patient that had a wound on his left medial ankle that I discharged on 02/10/16. He apparently reappeared the next week with open areas on his right medial ankle. Her intake nurse reports today that he has a lot of drainage and odor at intake even after the wound was cleaned. Also of note the patient complains of edema in the left leg and showed up with only one of the 2 layer compression system. 03/05/2016 -- since his visit 2 days ago to see Dr. Dellia Nims he complained of significant pain in his right lower extremity which was much more than he's ever had before. He came in for an urgent visit to review his condition. He has been placed on doxycycline empirically and his culture reports were reviewed but the final result is not back. Electronic  Signature(s) Signed: 03/05/2016 4:36:40 PM By: Christin Fudge MD, FACS Entered By: Christin Fudge on 03/05/2016 16:36:40 [name], [name] ([id number]) -------------------------------------------------------------------------------- Physical Exam Details [name], 03/05/2016 3:45 Patient Name: Date of Service: [facility] PM Medical Record Patient Account Number:  [id number] Number: Treating RN: Baruch Gouty RN, [name], Velva Harman Date of Birth/Sex: 10-28-1950 (65 y.o. Male) Other Clinician: Primary Care Physician: Joycelyn Das Referring Physician: Royetta Crochet Physician/Extender: Weeks in Treatment: 8 Constitutional . Pulse regular. Respirations normal and unlabored. Afebrile. . Eyes Nonicteric. Reactive to light. Ears, Nose, Mouth, and Throat Lips, teeth, and gums WNL.[name] Moist mucosa without lesions. Neck supple and nontender. No palpable supraclavicular or cervical adenopathy. Normal sized without goiter. Respiratory WNL. No retractions.. Breath sounds WNL, No rubs, rales, rhonchi, or wheeze.. Cardiovascular Heart rhythm and rate regular, no murmur or gallop.. Pedal Pulses WNL. No clubbing, cyanosis or edema. Chest Breasts symmetical and no nipple discharge.. Breast tissue WNL, no masses, lumps, or tenderness.. Lymphatic No adneopathy. No adenopathy. No adenopathy. Musculoskeletal Adexa without tenderness or enlargement.. Digits and nails w/o clubbing, cyanosis, infection, petechiae, ischemia, or inflammatory conditions.. Integumentary (Hair, Skin) No suspicious lesions. No crepitus or fluctuance. No peri-wound warmth or erythema. No masses.[name] Psychiatric Judgement and insight Intact.. No evidence of depression, anxiety, or agitation.. Notes the right medial ankle has a larger wound which has some subcutaneous debris which was washed out with moist saline gauze and the base of the ulcer looks clean. No sharp debridement was required today. Electronic Signature(s) Signed: 03/05/2016 4:37:36 PM By: Christin Fudge MD, FACS Entered By: Christin Fudge on 03/05/2016 16:37:36 Vangorder, [name] ([id number]) -------------------------------------------------------------------------------- Physician Orders Details Knebel, 03/05/2016 3:45 Patient Name: Date of Service: [facility] PM Medical Record Patient Account Number:  [id number] Number: Treating RN: Montey Hora Date of Birth/Sex: 05/03/1951 (65 y.o. Male) Other Clinician: Primary Care Physician: Joycelyn Das Referring Physician: Royetta Crochet Physician/Extender: Weeks in Treatment: 8 Verbal / Phone Orders: Yes Clinician: Montey Hora Read Back and Verified: Yes Diagnosis Coding Wound Cleansing Wound #3 Right Lower Leg o Clean wound with Normal Saline. o Cleanse wound with mild soap and water - at clinic Anesthetic Wound #3 Right Lower Leg o Topical Lidocaine 4% cream applied to wound bed prior to debridement Skin Barriers/Peri-Wound Care Wound #3 Right Lower Leg o Barrier cream Primary Wound Dressing Wound #3 Right Lower Leg o Aquacel Ag Secondary Dressing Wound #3 Right Lower Leg o ABD pad - charcoal o Dry Gauze Dressing Change Frequency Wound #3 Right Lower Leg o Change dressing every week Follow-up Appointments Wound #3 Right Lower Leg o Return Appointment in 1 week. Edema Control Wound #3 Right Lower Leg o 3 Layer Compression System - Right Lower Extremity - unna to anchor [name], [name] ([id number]) Additional Orders / Instructions Wound #3 Right Lower Leg o Increase protein intake. Medications-please add to medication list. Wound #3 Right Lower Leg o P.O. Antibiotics - Doxycycline Electronic Signature(s) Signed: 03/05/2016 4:40:13 PM By: Christin Fudge MD, FACS Signed: 03/05/2016 4:59:11 PM By: Montey Hora Entered By: Montey Hora on 03/05/2016 16:14:35 Ezzell, [name] -------------------------------------------------------------------------------- Problem List Details Cookston, 03/05/2016 3:45 Patient Name: Date of Service: [facility] PM Medical Record Patient Account Number: [id number] Number: Treating RN: Baruch Gouty RN, [name], Velva Harman Date of Birth/Sex: [birth year] (65 y.o. 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  03/03/2016 Yes breakdown of skin I87.331 Chronic venous hypertension (idiopathic) with ulcer and 03/03/2016 Yes inflammation of right lower extremity Inactive Problems Resolved Problems ICD-10 Code Description Active Date Resolved Date L97.321 Non-pressure chronic ulcer of left ankle limited to 01/06/2016 01/06/2016 breakdown of skin I87.332 Chronic venous hypertension (idiopathic) with ulcer and 01/06/2016 01/06/2016 inflammation of left lower extremity Electronic Signature(s) Signed: 03/05/2016 4:35:42 PM By: Christin Fudge MD, FACS Entered By: Christin Fudge on 03/05/2016 16:35:42 Sia, [name] ([id number]) -------------------------------------------------------------------------------- Progress Note Details [name], 03/05/2016 3:45 Patient Name: Date of Service: [name] PM Medical Record Patient Account Number: [id number] Number: Treating RN: Baruch Gouty RN, [name], Velva Harman Date of Birth/Sex: 05/27/1950 (65 y.o. Male) Other Clinician: Primary Care Physician: Retta Mac, Nova Schmuhl Referring Physician: Royetta Crochet Physician/Extender: Weeks in Treatment: 8 Subjective Chief Complaint Information obtained from Patient Patient follows up today due to reopening of the left medial venous ulcer History of Present Illness (HPI) The following HPI elements were documented for the patient's wound: Location: open wound just above his right ankle medially Quality: Patient tells me he is not having a significant amount of pain at this point in time. Severity: 1 out of 10 Duration: This just reopened in the past week. Timing: Pain in wound is Intermittent Context: The wound appeared gradually over time Modifying  Factors: Consults to this date include: he was seen in the ER and was referred to a vascular surgeon but the patient has not done that. He may have been treated with clindamycin in the ER. Associated Signs and Symptoms: Patient reports having difficulty standing for long periods. 65 year old gentleman who was seen in the emergency department recently on 01/06/2015 for a wound of his right lower extremity which he says was not involving any injury and he did not know how he sustained it. He had draining foul-smelling liquid from the area and had gone for care there. his past medical history is significant for DVT, hypertension, gout, tobacco abuse, cocaine abuse, stroke, atrial fibrillation, pulmonary embolism. he has also had some vascular surgery with a stent placed in his leg. He has been a smoker for many years and has given up straight drugs several years ago. He continues to smoke about 4-5 cigarettes a day. 02/03/2015 -- received a note from 05/14/2013 where Dr. Leotis Pain placed an inferior vena cava filter. The patient had a deep vein thrombosis while therapeutic on anticoagulation for previous DVT and a IVC filter was placed for this. 02/10/2015 -- he did have his vascular test done on Friday but we have no reports yet. 02/17/2015 -- notes were reviewed from the vascular office and the patient had a venous ultrasound done which revealed that he had no reflux in the greater saphenous vein or the short saphenous vein bilaterally. He did have subacute DVT in the common femoral vein and popliteal veins on the right and left side. The recommendation was to continue with Unna's boot therapy at the wound clinic and then to wear graduated compression stockings once the ulcers healed and later if he had continuous problems lymphedema pump would benefit him. 03/17/2015 -- we have applied for his insurance and aide regarding cellular tissue-based products and are [name], [name]  ([id number]) still awaiting the final clearance. 03/24/2015 -- he has had Apligraf authorized for him but his wound is looking so good today that we may not use it. 03/31/2015 -- he has not yet received his compression stockings though we have called a couple of times and hopefully they should arrive  this week. READMISSION 01/06/16; this is a patient we have previously cared for in this clinic with wounds on his right medial ankle. I was not previously involved with his care. 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 He wears compression stockings. He had reflux studies done during his last stay in this clinic which did not show significant reflux in the greater or lesser saphenous veins bilaterally. His history is that he developed a open sore on the left medial malleolus one week ago. He was seen in his primary physician office and given a course of doxycycline which he still should be on. Previously seen vascular surgery who felt that he had some degree of lymphedema as well. He is not a diabetic 01/13/16 no major change 01/20/16; very small wound on the medial right ankle again covered with surface slough that doesn't seem to be spotting the Prisma 01/27/16; patient comes in today complaining of a lot of pain around the wound site. He has not been systemically unwell. 02/03/16; the patient's wound culture last week grew Proteus, I had empirically given doxycycline. The Proteus was not specifically plated against doxycycline however Proteus itself was fairly pansensitive and the patient comes back feeling a lot better today. I think the doxycycline was likely to be successful in sufficient 02/10/16; as predicted last week the area has closed over. These are probably venous insufficiency wounds although his previous reflux studies did not show superficial reflux. He also has a history of DVT and at  one time had a Greenfield filter in place. The area in question on his left medial ankle region. It became secondarily infected but responded nicely to antibiotics. He is closed today 02/17/16 unfortunately patient's venous wound on the medial aspect of his right ankle at this point in time has reopened. He has been using some compression hose which appear to be very light that he purchased he tells me out of a magazine. He seems a little frustrated with the fact that this has reopened and is concerned about his left lower extremity possibly reopening as well. 02/25/16 patient presents today for follow-up evaluation regarding his right ankle wound. Currently he shows no interval signs or symptoms of infection. We have been compression wrapping him unfortunately the wraps that we had on him last week and he has a significant amount of swelling above whether this had slipped down to. He also notes that he's been having some burning as well at the wound site. He rates his discomfort at this point in time to be a 2-3 out of 10. Otherwise he has no other worsening symptoms. 03/03/16; this is a patient that had a wound on his left medial ankle that I discharged on 02/10/16. He apparently reappeared the next week with open areas on his right medial ankle. Her intake nurse reports today that he has a lot of drainage and odor at intake even after the wound was cleaned. Also of note the patient complains of edema in the left leg and showed up with only one of the 2 layer compression system. 03/05/2016 -- since his visit 2 days ago to see Dr. Dellia Nims he complained of significant pain in his right lower extremity which was much more than he's ever had before. He came in for an urgent visit to review his condition. He has been placed on doxycycline empirically and his culture reports  were reviewed but the final result is not back. [name], [name] ([id number]) Objective Constitutional Pulse regular.  Respirations normal and unlabored. Afebrile. Vitals Time Taken: 3:57 PM, Height: 74 in, Weight: 240 lbs, BMI: 30.8, Temperature: 98.7 F, Pulse: 74 bpm, Respiratory Rate: 20 breaths/min, Blood Pressure: 152/79 mmHg. Eyes Nonicteric. Reactive to light. Ears, Nose, Mouth, and Throat Lips, teeth, and gums WNL.[name] Moist mucosa without lesions. Neck supple and nontender. No palpable supraclavicular or cervical adenopathy. Normal sized without goiter. Respiratory WNL. No retractions.. Breath sounds WNL, No rubs, rales, rhonchi, or wheeze.. Cardiovascular Heart rhythm and rate regular, no murmur or gallop.. Pedal Pulses WNL. No clubbing, cyanosis or edema. Chest Breasts symmetical and no nipple discharge.. Breast tissue WNL, no masses, lumps, or tenderness.. Lymphatic No adneopathy. No adenopathy. No adenopathy. Musculoskeletal Adexa without tenderness or enlargement.. Digits and nails w/o clubbing, cyanosis, infection, petechiae, ischemia, or inflammatory conditions.[name] Psychiatric Judgement and insight Intact.. No evidence of depression, anxiety, or agitation.. General Notes: the right medial ankle has a larger wound which has some subcutaneous debris which was washed out with moist saline gauze and the base of the ulcer looks clean. No sharp debridement was required today. Integumentary (Hair, Skin) No suspicious lesions. No crepitus or fluctuance. No peri-wound warmth or erythema. No masses.. Wound #3 status is Open. Original cause of wound was Gradually Appeared. The wound is located on the Right Lower Leg. The wound measures 3.2cm length x 1.7cm width x 0.2cm depth; 4.273cm^2 area and [name], [name] ([id number]) 0.855cm^3 volume. The wound is limited to skin breakdown. There is no tunneling or undermining noted. There is a large amount of purulent drainage noted. The wound margin is flat and intact. There is medium (34-66%) red, pink, pale granulation within the wound bed. There is a  medium (34-66%) amount of necrotic tissue within the wound bed including Adherent Slough. The periwound skin appearance exhibited: Induration, Scarring, Moist. The periwound skin appearance did not exhibit: Callus, Crepitus, Excoriation, Fluctuance, Friable, Localized Edema, Rash, Dry/Scaly, Maceration, Atrophie Blanche, Cyanosis, Ecchymosis, Hemosiderin Staining, Mottled, Pallor, Rubor, Erythema. Periwound temperature was noted as No Abnormality. The periwound has tenderness on palpation. Assessment Active Problems ICD-10 S34.196 - Non-pressure chronic ulcer of right ankle limited to breakdown of skin I87.331 - Chronic venous hypertension (idiopathic) with ulcer and inflammation of right lower extremity Plan Wound Cleansing: Wound #3 Right Lower Leg: Clean wound with Normal Saline. Cleanse wound with mild soap and water - at clinic Anesthetic: Wound #3 Right Lower Leg: Topical Lidocaine 4% cream applied to wound bed prior to debridement Skin Barriers/Peri-Wound Care: Wound #3 Right Lower Leg: Barrier cream Primary Wound Dressing: Wound #3 Right Lower Leg: Aquacel Ag Secondary Dressing: Wound #3 Right Lower Leg: ABD pad - charcoal Dry Gauze Dressing Change Frequency: Wound #3 Right Lower Leg: Change dressing every week Follow-up Appointments: Wound #3 Right Lower Leg: Return Appointment in 1 week. Binney, [name] ([id number]) Edema Control: Wound #3 Right Lower Leg: 3 Layer Compression System - Right Lower Extremity - unna to anchor Additional Orders / Instructions: Wound #3 Right Lower Leg: Increase protein intake. Medications-please add to medication list.: Wound #3 Right Lower Leg: P.O. Antibiotics - Doxycycline After review today I did not find any clinical evidence of cellulitis or deep vein thrombosis and I believe most of the problem may have been because of his 4-layer compression which may have been too tight. I have recommended silver alginate and a 3 layer  Profore and for him to continue his doxycycline. He will come  back to see Dr. Dellia Nims on Wednesday. Electronic Signature(s) Signed: 03/05/2016 4:39:11 PM By: Christin Fudge MD, FACS Entered By: Christin Fudge on 03/05/2016 16:39:11 [name], [name] ([id number]) -------------------------------------------------------------------------------- SuperBill Details [name], Date of Service: 03/05/2016 Patient Name: [facility] Patient Account Number: [id number] Medical Record Afful, RN, [name], [id number] Treating RN: Number: Velva Harman Date of Birth/Sex: [birth year] (65 y.o. 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 (idiopathic) with ulcer and inflammation of right lower I87.331 extremity Facility Procedures CPT4: Description Modifier Quantity Code [id number] (Facility Use Only) [phone] - APPLY [place] RT 1 LEG Physician Procedures CPT4: Description Modifier Quantity Code [id number] - WC PHYS LEVEL 3 - EST PT 1 ICD-10 Description Diagnosis 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

## 2016-03-06 NOTE — Progress Notes (Signed)
SUREN, PAYNE ([id number]) Visit Report for 03/05/2016 Arrival Information Details Patient Name: [name], [name] Date of Service: 03/05/2016 3:45 PM Medical Record Number: [id number] Patient Account Number: [id number] Date of Birth/Sex: March 03, 1951 (65 y.o. Male) Treating RN: Baruch Gouty, RN, [name], Velva Harman Primary Care Physician: Royetta Crochet Other Clinician: Referring Physician: Royetta Crochet Treating Physician/Extender: Frann Rider in Treatment: 8 Visit Information History Since Last Visit All ordered tests and consults were completed: No Patient Arrived: [name] Added or deleted any medications: No Arrival Time: 15:55 Any new allergies or adverse reactions: No Accompanied By: self Had a fall or experienced change in No Transfer Assistance: None activities of daily living that may affect Patient Identification Verified: Yes risk of falls: Secondary Verification Process Yes Signs or symptoms of abuse/neglect since last No Completed: visito Patient Requires Transmission- No Hospitalized since last visit: No Based Precautions: Has Dressing in Place as Prescribed: Yes Patient Has Alerts: Yes Has Compression in Place as Prescribed: Yes Patient Alerts: Patient on Blood Pain Present Now: No Thinner warfarin Electronic Signature(s) Signed: 03/05/2016 4:38:05 PM By: [name] [name], RN Entered By: [name] on 03/05/2016 15:55:27 Robertshaw, [name] ([id number]) -------------------------------------------------------------------------------- Encounter Discharge Information Details Patient Name: [name] Date of Service: 03/05/2016 3:45 PM Medical Record Number: [id number] Patient Account Number: [id number] Date of Birth/Sex: Oct 04, 1950 (65 y.o. Male) Treating RN: Baruch Gouty, RN, [name], Velva Harman Primary Care Physician: Royetta Crochet Other Clinician: Referring Physician: Royetta Crochet Treating Physician/Extender: Frann Rider in Treatment: 8 Encounter Discharge  Information Items Discharge Pain Level: 0 Discharge Condition: Stable Ambulatory Status: Cane Discharge Destination: Home Transportation: Private Auto Accompanied By: self Schedule Follow-up Appointment: No Medication Reconciliation completed and provided to Patient/Care No Jimmie Dattilio: Provided on Clinical Summary of Care: 03/05/2016 Form Type Recipient Paper Patient WD Electronic Signature(s) Signed: 03/05/2016 4:29:49 PM By: [name] [name], RN Previous Signature: 03/05/2016 4:24:28 PM Version By: Ruthine Dose Entered By: [name] on 03/05/2016 16:29:49 Merta, [name] ([id number]) -------------------------------------------------------------------------------- Lower Extremity Assessment Details Patient Name: [name] Date of Service: 03/05/2016 3:45 PM Medical Record Number: [id number] Patient Account Number: [id number] Date of Birth/Sex: 1950-10-20 (65 y.o. Male) Treating RN: Baruch Gouty, RN, [name], Velva Harman Primary Care Physician: Royetta Crochet Other Clinician: Referring Physician: Royetta Crochet Treating Physician/Extender: Frann Rider in Treatment: 8 Edema Assessment Assessed: [Left: No] [Right: No] Edema: [Left: Ye] [Right: s] Calf Left: Right: Point of Measurement: 36 cm From Medial Instep cm 43.4 cm Ankle Left: Right: Point of Measurement: 14 cm From Medial Instep cm 28 cm Vascular Assessment Claudication: Claudication Assessment [Right:None] Pulses: Posterior Tibial Dorsalis Pedis Palpable: [Right:Yes] Extremity colors, hair growth, and conditions: Extremity Color: [Right:Hyperpigmented] Hair Growth on Extremity: [Right:No] Temperature of Extremity: [Right:Warm] Capillary Refill: [Right:< 3 seconds] Toe Nail Assessment Left: Right: Thick: Yes Discolored: Yes Deformed: No Improper Length and Hygiene: Yes Electronic Signature(s) Signed: 03/05/2016 4:38:05 PM By: [name] [name], RN Entered By: [name] on 03/05/2016  15:57:06 Bonnin, [name] ([id number]) Bulow, [name] ([id number]) -------------------------------------------------------------------------------- Multi Wound Chart Details Patient Name: [name] Date of Service: 03/05/2016 3:45 PM Medical Record Number: [id number] Patient Account Number: [id number] Date of Birth/Sex: April 06, 1951 (65 y.o. 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] [N/A:N/A] Comorbid  History: [3:Arrhythmia, Hypertension, N/A Gout, Neuropathy] Date Acquired: [3:02/11/2016] [N/A:N/A] Weeks of Treatment: [3:2] [N/A:N/A] Wound Status: [3:Open] [N/A:N/A] Clustered Wound: [3:Yes] [N/A:N/A] Measurements L x W x D 3.2x1.7x0.2 [N/A:N/A] (cm) Area (cm) : [3:4.273] [N/A:N/A] Volume (cm) : [3:0.855] [N/A:N/A] % Reduction in Area: [3:21.80%] [N/A:N/A] % Reduction in Volume: -56.30% [N/A:N/A] Classification: [3:Full Thickness Without Exposed Support Structures] [N/A:N/A] Exudate Amount: [3:Large] [N/A:N/A] Exudate Type: [3:Purulent] [N/A:N/A] Exudate Color: [3:yellow, brown, green] [N/A:N/A] Foul Odor After [3:Yes] [N/A:N/A] Cleansing: Odor Anticipated Due to No [N/A:N/A] Product Use: Wound Margin: [3:Flat and Intact] [N/A:N/A] Granulation Amount: [3:Medium (34-66%)] [N/A:N/A] Granulation Quality: [3:Red, Pink, Pale] [N/A:N/A] Necrotic Amount: [3:Medium (34-66%)] [N/A:N/A] Exposed Structures: Fascia: No N/A N/A Fat: No Tendon: No Muscle: No Joint: No Bone:  No Limited to Skin Breakdown Epithelialization: Small (1-33%) N/A N/A Periwound Skin Texture: Induration: Yes N/A N/A Scarring: Yes Edema: No Excoriation: No Callus: No Crepitus: No Fluctuance: No Friable: No Rash: No Periwound Skin Moist: Yes N/A N/A Moisture: Maceration: No Dry/Scaly: No Periwound Skin Color: Atrophie Blanche: No N/A N/A Cyanosis: No Ecchymosis: No Erythema: No Hemosiderin Staining: No Mottled: No Pallor: No Rubor: No Temperature: No Abnormality N/A N/A Tenderness on Yes N/A N/A Palpation: Wound Preparation: Ulcer Cleansing: N/A N/A Rinsed/Irrigated with Saline, Other: Soap and water Topical Anesthetic Applied: Other: lidocaine 4% Treatment Notes Electronic Signature(s) Signed: 03/05/2016 4:59:11 PM By: Montey Hora Entered By: Montey Hora on 03/05/2016 16:13:55 Fleming, [name] ([id number]) -------------------------------------------------------------------------------- Multi-Disciplinary Care Plan Details Patient Name: [name] Date of Service: 03/05/2016 3:45 PM Medical Record Number: [id number] Patient Account Number: [id number] Date of Birth/Sex: May 04, 1951 (65 y.o. Male) Treating RN: Montey Hora Primary Care Physician: Royetta Crochet Other Clinician: Referring Physician: Royetta Crochet Treating Physician/Extender: Frann Rider in Treatment: 8 Active Inactive Venous Leg Ulcer Nursing Diagnoses: Actual venous Insuffiency (use after diagnosis is confirmed) Goals: Patient will maintain optimal edema control Date Initiated: 02/17/2016 Goal Status: Active Interventions: Compression as ordered Notes: Wound/Skin Impairment Nursing Diagnoses: Impaired tissue integrity Goals: Patient/caregiver will verbalize understanding of skin care regimen Date Initiated: 02/17/2016 Goal Status: Active Ulcer/skin breakdown will have a volume reduction of 30% by week 4 Date Initiated: 02/17/2016 Goal Status:  Active Interventions: Assess ulceration(s) every visit Notes: Electronic Signature(s) Signed: 03/05/2016 4:59:11 PM By: Montey Hora Entered By: Montey Hora on 03/05/2016 16:13:10 Kutscher, [name] ([id number]) Strathman, [name] ([id number]) -------------------------------------------------------------------------------- Pain Assessment Details Patient Name: [name] Date of Service: 03/05/2016 3:45 PM Medical Record Number: [id number] Patient Account Number: [id number] Date of Birth/Sex: 1951/01/11 (65 y.o. Male) Treating RN: Baruch Gouty, RN, [name], Velva Harman Primary Care Physician: Royetta Crochet Other Clinician: Referring Physician: Royetta Crochet Treating Physician/Extender: Frann Rider in Treatment: 8 Active Problems Location of Pain Severity and Description of Pain Patient Has Paino Yes Site Locations Pain Location: Pain in Ulcers Rate the pain. Current Pain Level: 5 Character of Pain Describe the Pain: Aching, Tender Pain Management and Medication Current Pain Management: Medication: Yes Rest: Yes How does your pain impact your activities of daily livingo Sleep: Yes Bathing: Yes Appetite: Yes Relationship With Others: Yes Bladder Continence: Yes Emotions: Yes Bowel Continence: Yes Work: Yes Toileting: Yes Drive: Yes Dressing: Yes Hobbies: Yes Engineer, maintenance) Signed: 03/05/2016 4:38:05 PM By: [name] [name], RN Entered By: [name] on 03/05/2016 15:56:37 Wandel, [name] ([id number]) -------------------------------------------------------------------------------- Patient/Caregiver Education Details Patient Name: [name] Date of Service: 03/05/2016 3:45 PM Medical Record Number: [id number] Patient Account Number: [id number] Date of Birth/Gender: [birth year] (65 y.o. Male) Treating RN: Baruch Gouty, RN, [name], [place] Primary Care Physician: Alene Mires,  ADRIAN Other Clinician: Referring Physician: Royetta Crochet Treating  Physician/Extender: Frann Rider in Treatment: 8 Education Assessment Education Provided To: Patient Education Topics Provided Basic Hygiene: Methods: Explain/Verbal Responses: State content correctly Wound Debridement: Methods: Explain/Verbal Responses: State content correctly Wound/Skin Impairment: Methods: Explain/Verbal Responses: State content correctly Electronic Signature(s) Signed: 03/05/2016 4:38:05 PM By: [name] [name], RN Entered By: [name] on 03/05/2016 16:30:07 Sforza, [name] ([id number]) -------------------------------------------------------------------------------- Wound Assessment Details Patient Name: [name] Date of Service: 03/05/2016 3:45 PM Medical Record Number: [id number] Patient Account Number: [id number] Date of Birth/Sex: May 30, 1950 (65 y.o. Male) Treating RN: Afful, RN, [name], [place] Primary Care Physician: Royetta Crochet Other Clinician: Referring Physician: Royetta Crochet Treating Physician/Extender: Frann Rider in Treatment: 8 Wound Status Wound Number: 3 Primary Venous Leg Ulcer Etiology: Wound Location: Right Lower Leg Wound Status: Open Wounding Event: Gradually Appeared Comorbid Arrhythmia, Hypertension, Gout, Date Acquired: 02/11/2016 History: Neuropathy Weeks Of Treatment: 2 Clustered Wound: Yes Photos Photo Uploaded By: [name] on 03/05/2016 16:36:50 Wound Measurements Length: (cm) 3.2 Width: (cm) 1.7 Depth: (cm) 0.2 Area: (cm) 4.273 Volume: (cm) 0.855 % Reduction in Area: 21.8% % Reduction in Volume: -56.3% Epithelialization: Small (1-33%) Tunneling: No Undermining: No Wound Description Full Thickness Without Exposed Classification: Support Structures Wound Margin: Flat and Intact Exudate Large Amount: Exudate Type: Purulent Exudate Color: yellow, brown, green Foul Odor After Cleansing: Yes Due to Product Use: No Wound Bed Granulation Amount: Medium (34-66%) Exposed  Structure Granulation Quality: Red, Pink, Pale Fascia Exposed: No Necrotic Amount: Medium (34-66%) Fat Layer Exposed: No Necrotic Quality: Adherent Slough Tendon Exposed: No Muscle Exposed: No Zemanek, Mace ([id number]) Joint Exposed: No Bone Exposed: No Limited to Skin Breakdown Periwound Skin Texture Texture Color No Abnormalities Noted: No No Abnormalities Noted: No Callus: No Atrophie Blanche: No Crepitus: No Cyanosis: No Excoriation: No Ecchymosis: No Fluctuance: No Erythema: No Friable: No Hemosiderin Staining: No Induration: Yes Mottled: No Localized Edema: No Pallor: No Rash: No Rubor: No Scarring: Yes Temperature / Pain Moisture Temperature: No Abnormality No Abnormalities Noted: No Tenderness on Palpation: Yes Dry / Scaly: No Maceration: No Moist: Yes Wound Preparation Ulcer Cleansing: Rinsed/Irrigated with Saline, Other: Soap and water, Topical Anesthetic Applied: Other: lidocaine 4%, Treatment Notes Wound #3 (Right Lower Leg) 1. Cleansed with: Cleanse wound with antibacterial soap and water 3. Peri-wound Care: Barrier cream Moisturizing lotion 4. Dressing Applied: Aquacel Ag 5. Secondary Dressing Applied ABD Pad 7. Secured with 3 Layer Compression System - Right Lower Extremity Electronic Signature(s) Signed: 03/05/2016 4:38:05 PM By: [name] [name], RN Entered By: [name] on 03/05/2016 16:05:28 Doiron, [name] ([id number]) -------------------------------------------------------------------------------- Vitals Details Patient Name: [name] Date of Service: 03/05/2016 3:45 PM Medical Record Number: [id number] Patient Account Number: [id number] Date of Birth/Sex: April 17, 1951 (65 y.o. Male) Treating RN: Afful, RN, [name], Velva Harman Primary Care Physician: Royetta Crochet Other Clinician: Referring Physician: Royetta Crochet Treating Physician/Extender: Frann Rider in Treatment: 8 Vital Signs Time Taken:  15:57 Temperature (F): 98.7 Height (in): 74 Pulse (bpm): 74 Weight (lbs): 240 Respiratory Rate (breaths/min): 20 Body Mass Index (BMI): 30.8 Blood Pressure (mmHg): 152/79 Reference Range: 80 - 120 mg / dl Electronic Signature(s) Signed: 03/05/2016 4:38:05 PM By: [name] [name], RN Entered By: [name] on 03/05/2016 15:57:27

## 2016-03-10 ENCOUNTER — Encounter: Payer: Medicare Other | Admitting: Internal Medicine

## 2016-03-10 DIAGNOSIS — I87332 Chronic venous hypertension (idiopathic) with ulcer and inflammation of left lower extremity: Secondary | ICD-10-CM | POA: Diagnosis not present

## 2016-03-11 NOTE — Progress Notes (Signed)
OZZIE, KNOBEL ([id number]) Visit Report for 03/10/2016 Arrival Information Details Grondin, Date of Service: 03/10/2016 8:00 AM Patient Name: [facility] Patient Account Number: [id number] Medical Record Treating RN: Baruch Gouty, RN, [name], Velva Harman [id number] Number: Other Clinician: Date of Birth/Sex: May 10, 1951 (65 y.o. Male) Treating Dellia Nims, MICHAEL Primary Care Physician/Extender: Tonye Pearson Physician: Referring Physician: Doristine Locks in Treatment: 9 Visit Information History Since Last Visit All ordered tests and consults were completed: No Patient Arrived: Cane Added or deleted any medications: No Arrival Time: 08:03 Any new allergies or adverse reactions: No Accompanied By: self Had a fall or experienced change in No Transfer Assistance: None activities of daily living that may affect Patient Identification Verified: Yes risk of falls: Secondary Verification Process Yes Signs or symptoms of abuse/neglect since last No Completed: visito Patient Requires Transmission- No Hospitalized since last visit: No Based Precautions: Has Dressing in Place as Prescribed: Yes Patient Has Alerts: Yes Has Compression in Place as Prescribed: Yes Patient Alerts: Patient on Blood Pain Present Now: No Thinner warfarin Electronic Signature(s) Signed: 03/10/2016 5:35:18 PM By: [name] [name], RN Entered By: [name] on 03/10/2016 08:04:19 Dinovo, [name] ([id number]) -------------------------------------------------------------------------------- Encounter Discharge Information Details Wismer, Date of Service: 03/10/2016 8:00 AM Patient Name: [facility] Patient Account Number: [id number] Medical Record Treating RN: Baruch Gouty, RN, [name], Velva Harman [id number] Number: Other Clinician: Date of Birth/Sex: Apr 06, 1951 (65 y.o. Male) Treating [name], MICHAEL Primary Care Physician/Extender: Tonye Pearson Physician: Referring Physician: Doristine Locks in Treatment:  9 Encounter Discharge Information Items Discharge Pain Level: 0 Discharge Condition: Stable Ambulatory Status: Cane Discharge Destination: Home Transportation: Private Auto Accompanied By: SELF Schedule Follow-up Appointment: No Medication Reconciliation completed and provided to Patient/Care No Jazz Biddy: Provided on Clinical Summary of Care: 03/10/2016 Form Type Recipient Paper Patient WD Electronic Signature(s) Signed: 03/10/2016 5:35:18 PM By: [name] [name], RN Previous Signature: 03/10/2016 8:32:05 AM Version By: Ruthine Dose Entered By: [name] on 03/10/2016 08:37:22 Muhs, [name] ([id number]) -------------------------------------------------------------------------------- Lower Extremity Assessment Details Enrique, Date of Service: 03/10/2016 8:00 AM Patient Name: [name] Patient Account Number: [id number] Medical Record Treating RN: Baruch Gouty, RN, [name], Velva Harman [id number] Number: Other Clinician: Date of Birth/Sex: 06-08-1950 (65 y.o. Male) Treating [name], MICHAEL Primary Care Physician/Extender: Tonye Pearson Physician: Referring Physician: Doristine Locks in Treatment: 9 Edema Assessment Assessed: [Left: No] [Right: No] E[Left: dema] [Right: :] Calf Left: Right: Point of Measurement: 36 cm From Medial Instep cm 42.5 cm Ankle Left: Right: Point of Measurement: 14 cm From Medial Instep cm 28.1 cm Vascular Assessment Claudication: Claudication Assessment [Left:None] [Right:None] Pulses: Posterior Tibial Dorsalis Pedis Palpable: [Right:Yes] Extremity colors, hair growth, and conditions: Extremity Color: [Right:Dusky] Hair Growth on Extremity: [Right:No] Temperature of Extremity: [Right:Warm] Capillary Refill: [Right:< 3 seconds] Toe Nail Assessment Left: Right: Thick: Yes Discolored: Yes Deformed: No Improper Length and Hygiene: Yes Electronic Signature(s) KHALIK, PEWITT ([id number]) Signed: 03/10/2016 5:35:18 PM By: [name]  [name], RN Entered By: [name] on 03/10/2016 08:06:17 Koerner, [name] ([id number]) -------------------------------------------------------------------------------- Multi Wound Chart Details Lobello, Date of Service: 03/10/2016 8:00 AM Patient Name: [name] Patient Account Number: [id number] Medical Record Treating RN: Baruch Gouty RN, [name], Velva Harman [id number] Number: Other Clinician: Date of Birth/Sex: 02-06-1951 (65 y.o. Male) Treating [name], MICHAEL Primary Care Physician/Extender: Tonye Pearson Physician: Referring Physician: Doristine Locks in Treatment: 9 Vital Signs Height(in): 74 Pulse(bpm): 60 Weight(lbs): 240 Blood Pressure 145/81 (mmHg): Body Mass Index(BMI): 31 Temperature(F): 98.1 Respiratory Rate 19 (breaths/min): Photos: [3:No Photos] [N/A:N/A] Wound Location: [3:Right Lower Leg] [N/A:N/A] Wounding Event: [3:Gradually Appeared] [N/A:N/A] Primary  Etiology: [3:Venous Leg Ulcer] [N/A:N/A] Comorbid History: [3:Arrhythmia, Hypertension, N/A Gout, Neuropathy] Date Acquired: [3:02/11/2016] [N/A:N/A] Weeks of Treatment: [3:3] [N/A:N/A] Wound Status: [3:Open] [N/A:N/A] Clustered Wound: [3:Yes] [N/A:N/A] Measurements L x W x D 2.5x1.4x0.2 [N/A:N/A] (cm) Area (cm) : [3:2.749] [N/A:N/A] Volume (cm) : [3:0.55] [N/A:N/A] % Reduction in Area: [3:49.70%] [N/A:N/A] % Reduction in Volume: -0.50% [N/A:N/A] Classification: [3:Full Thickness Without Exposed Support Structures] [N/A:N/A] Exudate Amount: [3:Medium] [N/A:N/A] Exudate Type: [3:Serosanguineous] [N/A:N/A] Exudate Color: [3:red, brown] [N/A:N/A] Wound Margin: [3:Flat and Intact] [N/A:N/A] Granulation Amount: [3:Medium (34-66%)] [N/A:N/A] Granulation Quality: [3:Red, Pink, Pale] [N/A:N/A] Necrotic Amount: [3:Medium (34-66%)] [N/A:N/A] Exposed Structures: [N/A:N/A] Fascia: No Fat: No Tendon: No Muscle: No Joint: No Bone: No Limited to Skin Breakdown Epithelialization: Small (1-33%) N/A  N/A Periwound Skin Texture: Induration: Yes N/A N/A Scarring: Yes Edema: No Excoriation: No Callus: No Crepitus: No Fluctuance: No Friable: No Rash: No Periwound Skin Moist: Yes N/A N/A Moisture: Maceration: No Dry/Scaly: No Periwound Skin Color: Atrophie Blanche: No N/A N/A Cyanosis: No Ecchymosis: No Erythema: No Hemosiderin Staining: No Mottled: No Pallor: No Rubor: No Temperature: No Abnormality N/A N/A Tenderness on Yes N/A N/A Palpation: Wound Preparation: Ulcer Cleansing: N/A N/A Rinsed/Irrigated with Saline, Other: Soap and water Topical Anesthetic Applied: Other: lidocaine 4% Treatment Notes Electronic Signature(s) Signed: 03/10/2016 5:35:18 PM By: [name] [name], RN Entered By: [name] on 03/10/2016 08:30:07 Szeto, [name] ([id number]) -------------------------------------------------------------------------------- Multi-Disciplinary Care Plan Details Fawver, Date of Service: 03/10/2016 8:00 AM Patient Name: [facility] Patient Account Number: [id number] Medical Record Treating RN: Baruch Gouty, RN, [name], Velva Harman [id number] Number: Other Clinician: Date of Birth/Sex: 05-22-1950 (65 y.o. Male) Treating Dellia Nims, MICHAEL Primary Care Physician/Extender: Tonye Pearson Physician: Referring Physician: Doristine Locks in Treatment: 9 Active Inactive Venous Leg Ulcer Nursing Diagnoses: Actual venous Insuffiency (use after diagnosis is confirmed) Goals: Patient will maintain optimal edema control Date Initiated: 02/17/2016 Goal Status: Active Interventions: Compression as ordered Notes: Wound/Skin Impairment Nursing Diagnoses: Impaired tissue integrity Goals: Patient/caregiver will verbalize understanding of skin care regimen Date Initiated: 02/17/2016 Goal Status: Active Ulcer/skin breakdown will have a volume reduction of 30% by week 4 Date Initiated: 02/17/2016 Goal Status: Active Interventions: Assess ulceration(s) every  visit Notes: Electronic Signature(s) TAMARIO, HEAL ([id number]) Signed: 03/10/2016 5:35:18 PM By: [name] [name], RN Entered By: [name] on 03/10/2016 08:21:55 Edgin, [name] ([id number]) -------------------------------------------------------------------------------- Pain Assessment Details Greth, Date of Service: 03/10/2016 8:00 AM Patient Name: [name] Patient Account Number: [id number] Medical Record Treating RN: Baruch Gouty RN, [name], Velva Harman [id number] Number: Other Clinician: Date of Birth/Sex: 1950/06/14 (65 y.o. Male) Treating [name], MICHAEL Primary Care Physician/Extender: Tonye Pearson Physician: Referring Physician: Doristine Locks in Treatment: 9 Active Problems Location of Pain Severity and Description of Pain Patient Has Paino No Site Locations With Dressing Change: No Pain Management and Medication Current Pain Management: Electronic Signature(s) Signed: 03/10/2016 5:35:18 PM By: [name] [name], RN Entered By: [name] on 03/10/2016 08:04:27 Curci, [name] ([id number]) -------------------------------------------------------------------------------- Patient/Caregiver Education Details Procida, Date of Service: 03/10/2016 8:00 AM Patient Name: [facility] Patient Account Number: [id number] Medical Record Treating RN: Baruch Gouty RN, [name], Velva Harman [id number] Number: Other Clinician: Date of Birth/Gender: [birth year] (65 y.o. Male) Treating [name], MICHAEL Primary Care Physician/Extender: Tonye Pearson Physician: Suella Grove in Treatment: 9 Referring Physician: Royetta Crochet Education Assessment Education Provided To: Patient Education Topics Provided Basic Hygiene: Methods: Explain/Verbal Responses: State content correctly Wound/Skin Impairment: Methods: Explain/Verbal Responses: State content correctly Electronic Signature(s) Signed: 03/10/2016 5:35:18 PM By: [name] [name], RN Entered By: [name] on 03/10/2016 08:37:45 [place],  [name] ([id number]) -------------------------------------------------------------------------------- Wound Assessment Details Seliga, Date of Service: 03/10/2016 8:00 AM Patient Name: [facility] Patient Account Number: [id number] Medical Record Treating RN: Afful, RN, [name], Velva Harman [id number] Number: Other Clinician: Date of Birth/Sex: [birth year] (65 y.o. Male) Treating [name], MICHAEL Primary Care Physician/Extender: Tonye Pearson Physician: Referring Physician: Doristine Locks in Treatment: 9 Wound Status Wound Number: 3 Primary Venous Leg Ulcer Etiology: Wound Location: Right Lower Leg Wound Status: Open Wounding Event: Gradually Appeared Comorbid Arrhythmia, Hypertension, Gout, Date Acquired: 02/11/2016 History: Neuropathy Weeks Of Treatment: 3 Clustered Wound: Yes Photos Photo Uploaded By: [name] on 03/10/2016 17:07:18 Wound Measurements Length: (cm) 2.5 Width: (cm) 1.4 Depth: (cm) 0.2 Area: (cm) 2.749 Volume: (cm) 0.55 % Reduction in Area: 49.7% % Reduction in Volume: -0.5% Epithelialization: Small (1-33%) Tunneling: No Undermining: No Wound Description Full Thickness Without Exposed Foul Odor Aft Classification: Support Structures Wound Margin: Flat and Intact Exudate Medium Amount: Exudate Type: Serosanguineous Exudate Color: red, brown er Cleansing: No Wound Bed Granulation Amount: Medium (34-66%) Exposed Structure Granulation Quality: Red, Pink, Pale Fascia Exposed: No Foody, Derry ([id number]) Necrotic Amount: Medium (34-66%) Fat Layer Exposed: No Necrotic Quality: Adherent Slough Tendon Exposed: No Muscle Exposed: No Joint Exposed: No Bone Exposed: No Limited to Skin Breakdown Periwound Skin Texture Texture Color No Abnormalities Noted: No No Abnormalities Noted: No Callus: No Atrophie Blanche: No Crepitus: No Cyanosis: No Excoriation: No Ecchymosis: No Fluctuance: No Erythema: No Friable: No Hemosiderin Staining:  No Induration: Yes Mottled: No Localized Edema: No Pallor: No Rash: No Rubor: No Scarring: Yes Temperature / Pain Moisture Temperature: No Abnormality No Abnormalities Noted: No Tenderness on Palpation: Yes Dry / Scaly: No Maceration: No Moist: Yes Wound Preparation Ulcer Cleansing: Rinsed/Irrigated with Saline, Other: Soap and water, Topical Anesthetic Applied: Other: lidocaine 4%, Treatment Notes Wound #3 (Right Lower Leg) 1. Cleansed with: Cleanse wound with antibacterial soap and water 3. Peri-wound Care: Barrier cream Moisturizing lotion 4. Dressing Applied: Aquacel Ag 5. Secondary Dressing Applied ABD Pad 7. Secured with 3 Layer Compression System - Right Lower Extremity Electronic Signature(s) Signed: 03/10/2016 5:35:18 PM By: [name] [name], RN Entered By: [name] on 03/10/2016 08:11:22 Slowey, [name] ([id number]) Soffer, [name] ([id number]) -------------------------------------------------------------------------------- Vitals Details Elrod, Date of Service: 03/10/2016 8:00 AM Patient Name: [facility] Patient Account Number: [id number] Medical Record Treating RN: Baruch Gouty, RN, [name], Velva Harman [id number] Number: Other Clinician: Date of Birth/Sex: Jan 11, 1951 (65 y.o. Male) Treating [name], MICHAEL Primary Care Physician/Extender: Tonye Pearson Physician: Referring Physician: Doristine Locks in Treatment: 9 Vital Signs Time Taken: 08:04 Temperature (F): 98.1 Height (in): 74 Pulse (bpm): 60 Weight (lbs): 240 Respiratory Rate (breaths/min): 19 Body Mass Index (BMI): 30.8 Blood Pressure (mmHg): 145/81 Reference Range: 80 - 120 mg / dl Electronic Signature(s) Signed: 03/10/2016 5:35:18 PM By: [name] [name], RN Entered By: [name] on 03/10/2016 08:06:31

## 2016-03-11 NOTE — Progress Notes (Signed)
Mark, [name] ([id number]) Visit Report for 03/10/2016 Chief Complaint Document Details [name], Date of Service: 03/10/2016 8:00 AM Patient Name: [name] Patient Account Number: [id number] Medical Record Treating RN: Baruch Gouty, RN, [name], Velva Harman [id number] Number: Other Clinician: Date of Birth/Sex: 04-Feb-1951 (65 y.o. Male) Treating Dellia Nims, Lache Dagher Primary Care Physician/Extender: Tonye Pearson Physician: Referring Physician: Doristine Locks in Treatment: 9 Information Obtained from: Patient Chief Complaint Patient follows up today due to reopening of the left medial venous ulcer Electronic Signature(s) Signed: 03/10/2016 6:13:30 PM By: [name] MD Entered By: [name] on 03/10/2016 08:31:55 Hyams, [name] ([id number]) -------------------------------------------------------------------------------- HPI Details Hatcher, Date of Service: 03/10/2016 8:00 AM Patient Name: [name] Patient Account Number: [id number] Medical Record Treating RN: Baruch Gouty RN, [name], Velva Harman [id number] Number: Other Clinician: Date of Birth/Sex: Aug 13, 1950 (65 y.o. Male) Treating Nilah Belcourt Primary Care Physician/Extender: Tonye Pearson Physician: Referring Physician: Doristine Locks in Treatment: 9 History of Present Illness Location: open wound just above his right ankle medially Quality: Patient tells me he is not having a significant amount of pain at this point in time. Severity: 1 out of 10 Duration: This just reopened in the past week. Timing: Pain in wound is Intermittent Context: The wound appeared gradually over time Modifying Factors: Consults to this date include: he was seen in the ER and was referred to a vascular surgeon but the patient has not done that. He may have been treated with clindamycin in the ER. Associated Signs and Symptoms: Patient reports having difficulty standing for long periods. HPI Description: 65 year old gentleman who was seen in  the emergency department recently on 01/06/2015 for a wound of his right lower extremity which he says was not involving any injury and he did not know how he sustained it. He had draining foul-smelling liquid from the area and had gone for care there. his past medical history is significant for DVT, hypertension, gout, tobacco abuse, cocaine abuse, stroke, atrial fibrillation, pulmonary embolism. he has also had some vascular surgery with a stent placed in his leg. He has been a smoker for many years and has given up straight drugs several years ago. He continues to smoke about 4-5 cigarettes a day. 02/03/2015 -- received a note from 05/14/2013 where Dr. Leotis Pain placed an inferior vena cava filter. The patient had a deep vein thrombosis while therapeutic on anticoagulation for previous DVT and a IVC filter was placed for this. 02/10/2015 -- he did have his vascular test done on Friday but we have no reports yet. 02/17/2015 -- notes were reviewed from the vascular office and the patient had a venous ultrasound done which revealed that he had no reflux in the greater saphenous vein or the short saphenous vein bilaterally. He did have subacute DVT in the common femoral vein and popliteal veins on the right and left side. The recommendation was to continue with Unna's boot therapy at the wound clinic and then to wear graduated compression stockings once the ulcers healed and later if he had continuous problems lymphedema pump would benefit him. 03/17/2015 -- we have applied for his insurance and aide regarding cellular tissue-based products and are still awaiting the final clearance. 03/24/2015 -- he has had Apligraf authorized for him but his wound is looking so good today that we may not use it. 03/31/2015 -- he has not yet received his compression stockings though we have called a couple of times and hopefully they should arrive this week. [name], Mark  ([id number]) READMISSION 01/06/16; this is a patient we  have previously cared for in this clinic with wounds on his right medial ankle. I was not previously involved with his care. 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 He wears compression stockings. He had reflux studies done during his last stay in this clinic which did not show significant reflux in the greater or lesser saphenous veins bilaterally. His history is that he developed a open sore on the left medial malleolus one week ago. He was seen in his primary physician office and given a course of doxycycline which he still should be on. Previously seen vascular surgery who felt that he had some degree of lymphedema as well. He is not a diabetic 01/13/16 no major change 01/20/16; very small wound on the medial right ankle again covered with surface slough that doesn't seem to be spotting the Prisma 01/27/16; patient comes in today complaining of a lot of pain around the wound site. He has not been systemically unwell. 02/03/16; the patient's wound culture last week grew Proteus, I had empirically given doxycycline. The Proteus was not specifically plated against doxycycline however Proteus itself was fairly pansensitive and the patient comes back feeling a lot better today. I think the doxycycline was likely to be successful in sufficient 02/10/16; as predicted last week the area has closed over. These are probably venous insufficiency wounds although his previous reflux studies did not show superficial reflux. He also has a history of DVT and at one time had a Greenfield filter in place. The area in question on his left medial ankle region. It became secondarily infected but responded nicely to antibiotics. He is closed today 02/17/16 unfortunately patient's venous wound on the medial aspect of his right ankle at this point in time has reopened.  He has been using some compression hose which appear to be very light that he purchased he tells me out of a magazine. He seems a little frustrated with the fact that this has reopened and is concerned about his left lower extremity possibly reopening as well. 02/25/16 patient presents today for follow-up evaluation regarding his right ankle wound. Currently he shows no interval signs or symptoms of infection. We have been compression wrapping him unfortunately the wraps that we had on him last week and he has a significant amount of swelling above whether this had slipped down to. He also notes that he's been having some burning as well at the wound site. He rates his discomfort at this point in time to be a 2-3 out of 10. Otherwise he has no other worsening symptoms. 03/03/16; this is a patient that had a wound on his left medial ankle that I discharged on 02/10/16. He apparently reappeared the next week with open areas on his right medial ankle. Her intake nurse reports today that he has a lot of drainage and odor at intake even after the wound was cleaned. Also of note the patient complains of edema in the left leg and showed up with only one of the 2 layer compression system. 03/05/2016 -- since his visit 2 days ago to see Dr. Dellia Nims he complained of significant pain in his right lower extremity which was much more than he's ever had before. He came in for an urgent visit to review his condition. He has been placed on doxycycline empirically and his culture reports were reviewed but the final result is not back.  03/10/16; patient was in last week to see Dr. Con Memos with increasing pain in his leg. He was reduced to a 3 layer compression from 4 which seems to have helped overall. Culture from last week grew again pansensitive Proteus, this should've been sensitive to the doxycycline I gave him and he is finishing that today. The patient is had previous arterial and venous review by vascular  surgery. Patient is currently using Aquacel Ag under a 33 layer compression. Electronic Signature(s) TYRELL, SEIFER ([id number]) Signed: 03/10/2016 6:13:30 PM By: [name] MD Entered By: [name] on 03/10/2016 08:34:13 Meno, [name] ([id number]) -------------------------------------------------------------------------------- Physical Exam Details Deboer, Date of Service: 03/10/2016 8:00 AM Patient Name: [name] Patient Account Number: [id number] Medical Record Treating RN: Baruch Gouty RN, [name], Velva Harman [id number] Number: Other Clinician: Date of Birth/Sex: 08-May-1951 (65 y.o. Male) Treating Dellia Nims, Trevaun Rendleman Primary Care Physician/Extender: Tonye Pearson Physician: Referring Physician: Doristine Locks in Treatment: 9 Constitutional Patient is hypertensive.. Pulse regular and within target range for patient.[name] Respirations regular, non-labored and within target range.. Temperature is normal and within the target range for the patient.. Eyes Conjunctivae clear. No discharge.[name] Respiratory Respiratory effort is easy and symmetric bilaterally. Rate is normal at rest and on room air.. Cardiovascular Pedal pulses palpable and strong bilaterally.. Edema present in both extremities.varicosities,. Lymphatic none palpable in the popliteal or inguinal area. Psychiatric No evidence of depression, anxiety, or agitation. Calm, cooperative, and communicative. Appropriate interactions and affect.. Notes Wound exam; the right medial ankle wound is smaller. Base of this appears to be reasonably healthy. No debridement was required. There is no evidence of surrounding infection. Peripheral pulses are palpable Electronic Signature(s) Signed: 03/10/2016 6:13:30 PM By: [name] MD Entered By: [name] on 03/10/2016 08:36:00 Humphrey, [name] ([id number]) -------------------------------------------------------------------------------- Physician Orders  Details [place], Date of Service: 03/10/2016 8:00 AM Patient Name: [facility] Patient Account Number: [id number] Medical Record Treating RN: Baruch Gouty RN, [name], Velva Harman [id number] Number: Other Clinician: Date of Birth/Sex: Feb 18, 1951 (65 y.o. Male) Treating Kennia Vanvorst Primary Care Physician/Extender: Tonye Pearson Physician: Referring Physician: Doristine Locks in Treatment: 9 Verbal / Phone Orders: Yes Clinician: Afful, RN, [name], Rita Read Back and Verified: Yes Diagnosis Coding Wound Cleansing Wound #3 Right Lower Leg o Clean wound with Normal Saline. o Cleanse wound with mild soap and water - at clinic Anesthetic Wound #3 Right Lower Leg o Topical Lidocaine 4% cream applied to wound bed prior to debridement Skin Barriers/Peri-Wound Care Wound #3 Right Lower Leg o Barrier cream Primary Wound Dressing Wound #3 Right Lower Leg o Aquacel Ag Secondary Dressing Wound #3 Right Lower Leg o ABD pad o Dry Gauze Dressing Change Frequency Wound #3 Right Lower Leg o Change dressing every week Follow-up Appointments Wound #3 Right Lower Leg o Return Appointment in 1 week. Edema Control Wound #3 Right Lower Leg Willmott, Dickey ([id number]) o 3 Layer Compression System - Right Lower Extremity - unna to anchor Additional Orders / Instructions Wound #3 Right Lower Leg o Increase protein intake. Medications-please add to medication list. Wound #3 Right Lower Leg o P.O. Antibiotics - Doxycycline Electronic Signature(s) Signed: 03/10/2016 5:35:18 PM By: [name] [name], RN Signed: 03/10/2016 6:13:30 PM By: [name] MD Entered By: [name] on 03/10/2016 08:30:53 Rings, [name] ([id number]) -------------------------------------------------------------------------------- Problem List Details Mallet, Date of Service: 03/10/2016 8:00 AM Patient Name: [facility] Patient Account Number: [id number] Medical Record Treating RN: Baruch Gouty RN,  [name], Velva Harman [id number] Number: Other Clinician: Date of Birth/Sex: 1951/03/21 (65 y.o. 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 03/03/2016 Yes breakdown of skin I87.331 Chronic venous hypertension (idiopathic) with ulcer and 03/03/2016 Yes inflammation of right lower extremity Inactive Problems Resolved Problems ICD-10 Code Description Active Date Resolved Date L97.321 Non-pressure chronic ulcer of left ankle limited to 01/06/2016 01/06/2016 breakdown of skin I87.332 Chronic venous hypertension (idiopathic) with ulcer and 01/06/2016 01/06/2016 inflammation of left lower extremity Electronic Signature(s) Signed: 03/10/2016 6:13:30 PM By: [name] MD Entered By: [name] on 03/10/2016 08:31:31 Laba, [name] ([id number]) -------------------------------------------------------------------------------- Progress Note Details [place], Date of Service: 03/10/2016 8:00 AM Patient Name: [name] Patient Account Number: [id number] Medical Record Treating RN: Baruch Gouty RN, [name], Velva Harman [id number] Number: Other Clinician: Date of Birth/Sex: May 18, 1950 (65 y.o. Male) Treating Celisse Ciulla Primary Care Physician/Extender: Tonye Pearson Physician: Referring Physician: Doristine Locks in Treatment: 9 Subjective Chief Complaint Information obtained from Patient Patient follows up today due to reopening of the left medial venous ulcer History of Present Illness (HPI) The following HPI elements were documented for the patient's wound: Location: open wound just above his right ankle medially Quality: Patient tells me he is not having a significant amount of pain at this point in time. Severity: 1 out of 10 Duration: This just  reopened in the past week. Timing: Pain in wound is Intermittent Context: The wound appeared gradually over time Modifying Factors: Consults to this date include: he was seen in the ER and was referred to a vascular surgeon but the patient has not done that. He may have been treated with clindamycin in the ER. Associated Signs and Symptoms: Patient reports having difficulty standing for long periods. 65 year old gentleman who was seen in the emergency department recently on 01/06/2015 for a wound of his right lower extremity which he says was not involving any injury and he did not know how he sustained it. He had draining foul-smelling liquid from the area and had gone for care there. his past medical history is significant for DVT, hypertension, gout, tobacco abuse, cocaine abuse, stroke, atrial fibrillation, pulmonary embolism. he has also had some vascular surgery with a stent placed in his leg. He has been a smoker for many years and has given up straight drugs several years ago. He continues to smoke about 4-5 cigarettes a day. 02/03/2015 -- received a note from 05/14/2013 where Dr. Leotis Pain placed an inferior vena cava filter. The patient had a deep vein thrombosis while therapeutic on anticoagulation for previous DVT and a IVC filter was placed for this. 02/10/2015 -- he did have his vascular test done on Friday but we have no reports yet. 02/17/2015 -- notes were reviewed from the vascular office and the patient had a venous ultrasound done which revealed that he had no reflux in the greater saphenous vein or the short saphenous vein bilaterally. He did have subacute DVT in the common femoral vein and popliteal veins on the right and left side. The recommendation was to continue with Unna's boot therapy at the wound clinic and then to wear graduated compression stockings once the ulcers healed and later if he had continuous problems lymphedema pump would benefit him. COLLAN, SCHOENFELD ([id number]) 03/17/2015 -- we have applied for his insurance and aide regarding cellular tissue-based products and are still awaiting the final clearance. 03/24/2015 -- he has had Apligraf authorized for him but his wound is looking so good  today that we may not use it. 03/31/2015 -- he has not yet received his compression stockings though we have called a couple of times and hopefully they should arrive this week. READMISSION 01/06/16; this is a patient we have previously cared for in this clinic with wounds on his right medial ankle. I was not previously involved with his care. 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 He wears compression stockings. He had reflux studies done during his last stay in this clinic which did not show significant reflux in the greater or lesser saphenous veins bilaterally. His history is that he developed a open sore on the left medial malleolus one week ago. He was seen in his primary physician office and given a course of doxycycline which he still should be on. Previously seen vascular surgery who felt that he had some degree of lymphedema as well. He is not a diabetic 01/13/16 no major change 01/20/16; very small wound on the medial right ankle again covered with surface slough that doesn't seem to be spotting the Prisma 01/27/16; patient comes in today complaining of a lot of pain around the wound site. He has not been systemically unwell. 02/03/16; the patient's wound culture last week grew Proteus, I had empirically given doxycycline. The Proteus was not specifically plated against doxycycline however Proteus itself was fairly pansensitive and the patient comes back feeling a lot better today. I think the doxycycline was likely to be successful in sufficient 02/10/16; as predicted last week the area has closed over. These are probably venous insufficiency  wounds although his previous reflux studies did not show superficial reflux. He also has a history of DVT and at one time had a Greenfield filter in place. The area in question on his left medial ankle region. It became secondarily infected but responded nicely to antibiotics. He is closed today 02/17/16 unfortunately patient's venous wound on the medial aspect of his right ankle at this point in time has reopened. He has been using some compression hose which appear to be very light that he purchased he tells me out of a magazine. He seems a little frustrated with the fact that this has reopened and is concerned about his left lower extremity possibly reopening as well. 02/25/16 patient presents today for follow-up evaluation regarding his right ankle wound. Currently he shows no interval signs or symptoms of infection. We have been compression wrapping him unfortunately the wraps that we had on him last week and he has a significant amount of swelling above whether this had slipped down to. He also notes that he's been having some burning as well at the wound site. He rates his discomfort at this point in time to be a 2-3 out of 10. Otherwise he has no other worsening symptoms. 03/03/16; this is a patient that had a wound on his left medial ankle that I discharged on 02/10/16. He apparently reappeared the next week with open areas on his right medial ankle. Her intake nurse reports today that he has a lot of drainage and odor at intake even after the wound was cleaned. Also of note the patient complains of edema in the left leg and showed up with only one of the 2 layer compression system. 03/05/2016 -- since his visit 2 days ago to see Dr. Dellia Nims he complained of significant pain in his right lower extremity which  was much more than he's ever had before. He came in for an urgent visit to review his condition. He has been placed on doxycycline empirically and his culture reports were reviewed but  the final result is not back. 03/10/16; patient was in last week to see Dr. Con Memos with increasing pain in his leg. He was reduced to a 3 Montellano, Mounir ([id number]) layer compression from 4 which seems to have helped overall. Culture from last week grew again pansensitive Proteus, this should've been sensitive to the doxycycline I gave him and he is finishing that today. The patient is had previous arterial and venous review by vascular surgery. Patient is currently using Aquacel Ag under a 33 layer compression. Objective Constitutional Patient is hypertensive.. Pulse regular and within target range for patient.[name] Respirations regular, non-labored and within target range.. Temperature is normal and within the target range for the patient.. Vitals Time Taken: 8:04 AM, Height: 74 in, Weight: 240 lbs, BMI: 30.8, Temperature: 98.1 F, Pulse: 60 bpm, Respiratory Rate: 19 breaths/min, Blood Pressure: 145/81 mmHg. Eyes Conjunctivae clear. No discharge.[name] Respiratory Respiratory effort is easy and symmetric bilaterally. Rate is normal at rest and on room air.. Cardiovascular Pedal pulses palpable and strong bilaterally.. Edema present in both extremities.varicosities,. Lymphatic none palpable in the popliteal or inguinal area. Psychiatric No evidence of depression, anxiety, or agitation. Calm, cooperative, and communicative. Appropriate interactions and affect.. General Notes: Wound exam; the right medial ankle wound is smaller. Base of this appears to be reasonably healthy. No debridement was required. There is no evidence of surrounding infection. Peripheral pulses are palpable Integumentary (Hair, Skin) Wound #3 status is Open. Original cause of wound was Gradually Appeared. The wound is located on the Right Lower Leg. The wound measures 2.5cm length x 1.4cm width x 0.2cm depth; 2.749cm^2 area and 0.55cm^3 volume. The wound is limited to skin breakdown. There is no tunneling or  undermining noted. There is a medium amount of serosanguineous drainage noted. The wound margin is flat and intact. There is medium (34-66%) red, pink, pale granulation within the wound bed. There is a medium (34-66%) amount of necrotic tissue within the wound bed including Adherent Slough. The periwound skin appearance exhibited: Induration, Scarring, Moist. The periwound skin appearance did not exhibit: Callus, Crepitus, Excoriation, Fluctuance, Friable, Localized Edema, Rash, Dry/Scaly, Maceration, Atrophie Blanche, Cappuccio, Firas ([id number]) Cyanosis, Ecchymosis, Hemosiderin Staining, Mottled, Pallor, Rubor, Erythema. Periwound temperature was noted as No Abnormality. The periwound has tenderness on palpation. Assessment Active Problems ICD-10 D35.701 - Non-pressure chronic ulcer of right ankle limited to breakdown of skin I87.331 - Chronic venous hypertension (idiopathic) with ulcer and inflammation of right lower extremity Plan Wound Cleansing: Wound #3 Right Lower Leg: Clean wound with Normal Saline. Cleanse wound with mild soap and water - at clinic Anesthetic: Wound #3 Right Lower Leg: Topical Lidocaine 4% cream applied to wound bed prior to debridement Skin Barriers/Peri-Wound Care: Wound #3 Right Lower Leg: Barrier cream Primary Wound Dressing: Wound #3 Right Lower Leg: Aquacel Ag Secondary Dressing: Wound #3 Right Lower Leg: ABD pad Dry Gauze Dressing Change Frequency: Wound #3 Right Lower Leg: Change dressing every week Follow-up Appointments: Wound #3 Right Lower Leg: Return Appointment in 1 week. Edema Control: Wound #3 Right Lower Leg: 3 Layer Compression System - Right Lower Extremity - unna to anchor Additional Orders / Instructions: Wound #3 Right Lower Leg: Increase protein intake. Leonhard, [name] ([id number]) Medications-please add to medication list.: Wound #3 Right Lower Leg: P.O. Antibiotics - Doxycycline #1Continue with the  same  dressing silver alginate,profore lite. No changes to the current treatment plan unless the wound stalls. Not completely clear if he was suing the stockings at the time of this reoccurence although he has one on the left side now Electronic Signature(s) Signed: 03/10/2016 6:13:30 PM By: [name] MD Entered By: [name] on 03/10/2016 08:38:40 Herbert, [name] ([id number]) -------------------------------------------------------------------------------- SuperBill Details Rabe, Date of Service: 03/10/2016 Patient Name: [facility] Patient Account Number: [id number] Medical Record Treating RN: Baruch Gouty RN, [name], Velva Harman [id number] Number: Other Clinician: Date of Birth/Sex: 1951-01-08 (65 y.o. 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

## 2016-03-12 ENCOUNTER — Ambulatory Visit: Payer: Medicare Other | Admitting: Surgery

## 2016-03-17 ENCOUNTER — Encounter: Payer: Medicare Other | Attending: Internal Medicine | Admitting: Internal Medicine

## 2016-03-17 DIAGNOSIS — I4891 Unspecified atrial fibrillation: Secondary | ICD-10-CM | POA: Diagnosis not present

## 2016-03-17 DIAGNOSIS — I10 Essential (primary) hypertension: Secondary | ICD-10-CM | POA: Insufficient documentation

## 2016-03-17 DIAGNOSIS — L97311 Non-pressure chronic ulcer of right ankle limited to breakdown of skin: Secondary | ICD-10-CM | POA: Insufficient documentation

## 2016-03-17 DIAGNOSIS — Z7901 Long term (current) use of anticoagulants: Secondary | ICD-10-CM | POA: Insufficient documentation

## 2016-03-17 DIAGNOSIS — F17210 Nicotine dependence, cigarettes, uncomplicated: Secondary | ICD-10-CM | POA: Insufficient documentation

## 2016-03-17 DIAGNOSIS — Z86718 Personal history of other venous thrombosis and embolism: Secondary | ICD-10-CM | POA: Insufficient documentation

## 2016-03-17 DIAGNOSIS — Z8673 Personal history of transient ischemic attack (TIA), and cerebral infarction without residual deficits: Secondary | ICD-10-CM | POA: Insufficient documentation

## 2016-03-17 DIAGNOSIS — I87331 Chronic venous hypertension (idiopathic) with ulcer and inflammation of right lower extremity: Secondary | ICD-10-CM | POA: Insufficient documentation

## 2016-03-17 DIAGNOSIS — M109 Gout, unspecified: Secondary | ICD-10-CM | POA: Insufficient documentation

## 2016-03-18 NOTE — Progress Notes (Signed)
[name], [name] ([id number]) Visit Report for 03/17/2016 Arrival Information Details [place], Date of Service: 03/17/2016 8:15 AM Patient Name: [name] Patient Account Number: [id number] Medical Record Treating RN: Baruch Gouty, RN, [name], Velva Harman [id number] Number: Other Clinician: Date of Birth/Sex: December 19, 1950 (65 y.o. Male) Treating Dellia Nims, MICHAEL Primary Care Physician/Extender: Tonye Pearson Physician: Referring Physician: Doristine Locks in Treatment: 10 Visit Information History Since Last Visit All ordered tests and consults were completed: No Patient Arrived: Cane Added or deleted any medications: No Arrival Time: 08:22 Any new allergies or adverse reactions: No Accompanied By: self Had a fall or experienced change in No Transfer Assistance: None activities of daily living that may affect Patient Identification Verified: Yes risk of falls: Secondary Verification Process Yes Signs or symptoms of abuse/neglect since last No Completed: visito Patient Requires Transmission- No Hospitalized since last visit: No Based Precautions: Has Dressing in Place as Prescribed: Yes Patient Has Alerts: Yes Has Compression in Place as Prescribed: Yes Patient Alerts: Patient on Blood Pain Present Now: No Thinner warfarin Electronic Signature(s) Signed: 03/17/2016 5:51:44 PM By: [name] [name], RN Entered By: [name] on 03/17/2016 08:23:10 [name], [name] ([id number]) -------------------------------------------------------------------------------- Encounter Discharge Information Details [name], Date of Service: 03/17/2016 8:15 AM Patient Name: [name] Patient Account Number: [id number] Medical Record Treating RN: Baruch Gouty, RN, [name], Velva Harman [id number] Number: Other Clinician: Date of Birth/Sex: 1950-09-09 (65 y.o. Male) Treating [name], MICHAEL Primary Care Physician/Extender: Tonye Pearson Physician: Referring Physician: Doristine Locks in Treatment: 10 Encounter  Discharge Information Items Discharge Pain Level: 0 Discharge Condition: Stable Ambulatory Status: Ambulatory Discharge Destination: Home Transportation: Private Auto Accompanied By: self Schedule Follow-up Appointment: No Medication Reconciliation completed and provided to Patient/Care No Renuka Farfan: Provided on Clinical Summary of Care: 03/17/2016 Form Type Recipient Paper Patient WD Electronic Signature(s) Signed: 03/17/2016 5:46:25 PM By: [name] [name], RN Previous Signature: 03/17/2016 8:56:38 AM Version By: Ruthine Dose Entered By: [name] on 03/17/2016 17:46:24 [name], [name] ([id number]) -------------------------------------------------------------------------------- Lower Extremity Assessment Details [name], Date of Service: 03/17/2016 8:15 AM Patient Name: [name] Patient Account Number: [id number] Medical Record Treating RN: Baruch Gouty, RN, [name], Velva Harman [id number] Number: Other Clinician: Date of Birth/Sex: 1950-09-17 (65 y.o. Male) Treating [name], MICHAEL Primary Care Physician/Extender: Tonye Pearson Physician: Referring Physician: Doristine Locks in Treatment: 10 Edema Assessment Assessed: [Left: No] [Right: No] E[Left: dema] [Right: :] Calf Left: Right: Point of Measurement: 36 cm From Medial Instep cm 42.3 cm Ankle Left: Right: Point of Measurement: 14 cm From Medial Instep cm 28 cm Vascular Assessment Claudication: Claudication Assessment [Right:None] Pulses: Posterior Tibial Dorsalis Pedis Palpable: [Right:Yes] Extremity colors, hair growth, and conditions: Extremity Color: [Right:Hyperpigmented] Hair Growth on Extremity: [Right:No] Temperature of Extremity: [Right:Warm] Capillary Refill: [Right:< 3 seconds] Toe Nail Assessment Left: Right: Thick: Yes Discolored: Yes Deformed: No Improper Length and Hygiene: Yes Electronic Signature(s) [name], [name] ([id number]) Signed: 03/17/2016 5:51:44 PM By: [name] [name], RN Entered  By: [name] on 03/17/2016 08:23:49 [name], [name] ([id number]) -------------------------------------------------------------------------------- Multi Wound Chart Details [place], Date of Service: 03/17/2016 8:15 AM Patient Name: [name] Patient Account Number: [id number] Medical Record Treating RN: Baruch Gouty RN, [name], Velva Harman [id number] Number: Other Clinician: Date of Birth/Sex: [birth year] (65 y.o. 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] [N/A:N/A] Comorbid History: [3:Arrhythmia, Hypertension, N/A Gout, Neuropathy] Date Acquired: [3:02/11/2016] [N/A:N/A] Weeks of Treatment: [3:4] [N/A:N/A] Wound Status: [3:Open] [N/A:N/A] Clustered Wound: [3:Yes] [N/A:N/A] Measurements L x W x D 1.8x0.7x0.2 [N/A:N/A] (cm) Area (cm) : [3:0.99] [N/A:N/A] Volume (cm) : [3:0.198] [N/A:N/A] % Reduction in Area: [3:81.90%] [N/A:N/A] % Reduction in Volume: 63.80% [N/A:N/A] Classification: [3:Full Thickness Without Exposed Support Structures] [N/A:N/A] Exudate Amount: [3:Medium] [N/A:N/A] Exudate Type: [3:Serosanguineous] [N/A:N/A] Exudate Color: [3:red, brown] [N/A:N/A] Wound Margin: [3:Flat and Intact] [N/A:N/A] Granulation Amount: [3:Medium (34-66%)] [N/A:N/A] Granulation Quality: [3:Red, Pink, Pale] [N/A:N/A] Necrotic Amount: [3:Small (1-33%)] [N/A:N/A] Exposed Structures: [N/A:N/A] Fascia: No Fat: No Tendon: No Muscle: No Joint: No Bone: No Limited to Skin Breakdown Epithelialization: Small (1-33%) N/A N/A Periwound Skin Texture:  Induration: Yes N/A N/A Scarring: Yes Edema: No Excoriation: No Callus: No Crepitus: No Fluctuance: No Friable: No Rash: No Periwound Skin Moist: Yes N/A N/A Moisture: Dry/Scaly: Yes Maceration: No Periwound Skin Color: Atrophie Blanche: No N/A N/A Cyanosis: No Ecchymosis: No Erythema: No Hemosiderin Staining: No Mottled: No Pallor: No Rubor: No Temperature: No Abnormality N/A N/A Tenderness on Yes N/A N/A Palpation: Wound Preparation: Ulcer Cleansing: N/A N/A Rinsed/Irrigated with Saline, Other: Soap and water Topical Anesthetic Applied: Other: lidocaine 4% Treatment Notes Electronic Signature(s) Signed: 03/17/2016 5:51:44 PM By: [name] [name], RN Entered By: [name] on 03/17/2016 08:32:05 Antonetti, [name] ([id number]) -------------------------------------------------------------------------------- [place] Details [place], Date of Service: 03/17/2016 8:15 AM Patient Name: [name] Patient Account Number: [id number] Medical Record Treating RN: Baruch Gouty RN, [name], Velva Harman [id number] Number: Other Clinician: Date of Birth/Sex: 1950-08-20 (65 y.o. Male) Treating [name], MICHAEL Primary Care Physician/Extender: Tonye Pearson Physician: Referring Physician: Doristine Locks in Treatment: 10 Active Inactive Venous Leg Ulcer Nursing Diagnoses: Actual venous Insuffiency (use after diagnosis is confirmed) Goals: Patient will maintain optimal edema control Date Initiated: 02/17/2016 Goal Status: Active Interventions: Compression as ordered Notes: Wound/Skin Impairment Nursing Diagnoses: Impaired tissue integrity Goals: Patient/caregiver will verbalize understanding of skin care regimen Date Initiated: 02/17/2016 Goal Status: Active Ulcer/skin breakdown will have a volume reduction of 30% by week 4 Date Initiated: 02/17/2016 Goal Status: Active Interventions: Assess ulceration(s) every visit Notes: Electronic  Signature(s) KAYVAN, HOEFLING ([id number]) Signed: 03/17/2016 5:51:44 PM By: [name] [name], RN Entered By: [name] on 03/17/2016 08:31:59 [place], [name] ([id number]) -------------------------------------------------------------------------------- Pain Assessment Details [place], Date of Service: 03/17/2016 8:15 AM Patient Name: [name] Patient Account Number: [id number] Medical Record Treating RN: Baruch Gouty RN, [name], Velva Harman [id number] Number: Other Clinician: Date of Birth/Sex: April 06, 1951 (65 y.o. Male) Treating [name], MICHAEL Primary Care Physician/Extender: Tonye Pearson Physician: Referring Physician: Doristine Locks in Treatment: 10 Active Problems Location of Pain Severity and Description of Pain Patient Has Paino No Site Locations With Dressing Change: No Pain Management and Medication Current Pain Management: Electronic Signature(s) Signed: 03/17/2016 5:51:44 PM By: [name] [name], RN Entered By: [name] on 03/17/2016 08:23:17 Randal, [name] -------------------------------------------------------------------------------- Patient/Caregiver Education Details [place], Date of Service: 03/17/2016 8:15 AM Patient Name: [name] Patient Account Number: [id number] Medical Record Treating RN: Baruch Gouty RN, [name], Velva Harman [id number] Number: Other Clinician: Date of Birth/Gender: 1951-01-05 (65 y.o. Male) Treating [name], MICHAEL Primary Care Physician/Extender: Tonye Pearson Physician: Suella [place] in Treatment: 10 Referring Physician: Royetta Crochet Education Assessment Education Provided To: Patient Education Topics Provided Basic Hygiene: Methods: Explain/Verbal Responses: State content correctly Wound Debridement: Methods: Explain/Verbal Wound/Skin Impairment: Methods: Explain/Verbal Responses: State content correctly Electronic Signature(s) Signed: 03/17/2016 5:51:44 PM By: [name] [name], RN Entered By: [name] on  03/17/2016  17:46:45 [place], [name] ([id number]) -------------------------------------------------------------------------------- Wound Assessment Details [place], Date of Service: 03/17/2016 8:15 AM Patient Name: [name] Patient Account Number: [id number] Medical Record Treating RN: Afful, RN, [name], Velva Harman [id number] Number: Other Clinician: Date of Birth/Sex: [birth year] (65 y.o. Male) Treating [name], MICHAEL Primary Care Physician/Extender: Tonye Pearson Physician: Referring Physician: Doristine Locks in Treatment: 10 Wound Status Wound Number: 3 Primary Venous Leg Ulcer Etiology: Wound Location: Right Lower Leg Wound Status: Open Wounding Event: Gradually Appeared Comorbid Arrhythmia, Hypertension, Gout, Date Acquired: 02/11/2016 History: Neuropathy Weeks Of Treatment: 4 Clustered Wound: Yes Photos Photo Uploaded By: [name] on 03/17/2016 17:42:21 Wound Measurements Length: (cm) 1.8 Width: (cm) 0.7 Depth: (cm) 0.2 Area: (cm) 0.99 Volume: (cm) 0.198 % Reduction in Area: 81.9% % Reduction in Volume: 63.8% Epithelialization: Small (1-33%) Tunneling: No Wound Description Full Thickness Without Exposed Classification: Support Structures Bain, [name] ([id number]) Foul Odor After Cleansing: No Wound Margin: Flat and Intact Exudate Medium Amount: Exudate Type: Serosanguineous Exudate Color: red, brown Wound Bed Granulation Amount: Medium (34-66%) Exposed Structure Granulation Quality: Red, Pink, Pale Fascia Exposed: No Necrotic Amount: Small (1-33%) Fat Layer Exposed: No Necrotic Quality: Adherent Slough Tendon Exposed: No Muscle Exposed: No Joint Exposed: No Bone Exposed: No Limited to Skin Breakdown Periwound Skin Texture Texture Color No Abnormalities Noted: No No Abnormalities Noted: No Callus: No Atrophie Blanche: No Crepitus: No Cyanosis: No Excoriation: No Ecchymosis: No Fluctuance: No Erythema: No Friable: No Hemosiderin  Staining: No Induration: Yes Mottled: No Localized Edema: No Pallor: No Rash: No Rubor: No Scarring: Yes Temperature / Pain Moisture Temperature: No Abnormality No Abnormalities Noted: No Tenderness on Palpation: Yes Dry / Scaly: Yes Maceration: No Moist: Yes Wound Preparation Ulcer Cleansing: Rinsed/Irrigated with Saline, Other: Soap and water, Topical Anesthetic Applied: Other: lidocaine 4%, Treatment Notes Wound #3 (Right Lower Leg) 1. Cleansed with: Cleanse wound with antibacterial soap and water 4. Dressing Applied: Aquacel Ag 5. Secondary Dressing Applied ABD Pad Forand, [name] ([id number]) 7. Secured with 3 Layer Compression System - Right Lower Extremity Electronic Signature(s) Signed: 03/17/2016 5:51:44 PM By: [name] [name], RN Entered By: [name] on 03/17/2016 08:28:40 Metzner, [name] ([id number]) -------------------------------------------------------------------------------- Vitals Details [place], Date of Service: 03/17/2016 8:15 AM Patient Name: [name] Patient Account Number: [id number] Medical Record Treating RN: Baruch Gouty, RN, [name], Velva Harman [id number] Number: Other Clinician: Date of Birth/Sex: 1951-02-20 (65 y.o. Male) Treating [name], MICHAEL Primary Care Physician/Extender: Tonye Pearson Physician: Referring Physician: Doristine Locks in Treatment: 10 Vital Signs Time Taken: 08:23 Temperature (F): 97.5 Height (in): 74 Pulse (bpm): 64 Weight (lbs): 240 Respiratory Rate (breaths/min): 19 Body Mass Index (BMI): 30.8 Blood Pressure (mmHg): 149/92 Reference Range: 80 - 120 mg / dl Electronic Signature(s) Signed: 03/17/2016 5:51:44 PM By: [name] [name], RN Entered By: [name] on 03/17/2016 08:27:09

## 2016-03-18 NOTE — Progress Notes (Signed)
[name], [name] ([id number]) Visit Report for 03/17/2016 Chief Complaint Document Details Colbert, Date of Service: 03/17/2016 8:15 AM Patient Name: [name] Patient Account Number: [id number] Medical Record Treating RN: Baruch Gouty, RN, [name], Velva Harman [id number] Number: Other Clinician: Date of Birth/Sex: 03-23-1951 (65 y.o. Male) Treating Dellia Nims, Selina Tapper Primary [name] Physician/Extender: Tonye Pearson Physician: Referring Physician: Doristine Locks in Treatment: 10 Information Obtained from: Patient Chief Complaint Patient follows up today due to reopening of Mark left medial venous ulcer Electronic Signature(s) Signed: 03/17/2016 5:04:57 PM By: [name] MD Entered By: [name] on 03/17/2016 09:16:49 Frasco, [name] ([id number]) -------------------------------------------------------------------------------- Debridement Details Ringold, Date of Service: 03/17/2016 8:15 AM Patient Name: [name] Patient Account Number: [id number] Medical Record Treating RN: Baruch Gouty RN, [name], Velva Harman [id number] Number: Other Clinician: Date of Birth/Sex: [birth year] (65 y.o. Male) Treating Kourtney Terriquez Primary [name] Physician/Extender: Tonye Pearson Physician: Referring Physician: Doristine Locks in Treatment: 10 Debridement Performed for Wound #3 Right Lower Leg Assessment: Performed By: Physician Ricard Dillon, MD Debridement: Debridement Pre-procedure Yes - 08:43 Verification/Time Out Taken: Start Time: 08:43 Pain Control: Lidocaine 4% Topical Solution Level: Skin/Subcutaneous Tissue Total Area Debrided (L x 1.8 (cm) x 0.7 (cm) = 1.26 (cm) W): Tissue and other Exudate, Fibrin/Slough, Subcutaneous material debrided: Instrument: Curette Bleeding: Minimum Hemostasis Achieved: Pressure End Time: 08:46 Procedural Pain: 0 Post Procedural Pain: 0 Response to Treatment: Procedure was tolerated well Post Debridement Measurements of Total Wound Length: (cm)  1.8 Width: (cm) 0.7 Depth: (cm) 0.2 Volume: (cm) 0.198 Character of Wound/Ulcer Post Stable Debridement: Severity of Tissue Post Debridement: Fat layer exposed Post Procedure Diagnosis Same as Pre-procedure Electronic Signature(s) Signed: 03/17/2016 5:04:57 PM By: [name] MD Dirks, [name] ([id number]) Signed: 03/17/2016 5:51:44 PM By: [name] [name], RN Entered By: [name] on 03/17/2016 09:14:47 Gully, [name] ([id number]) -------------------------------------------------------------------------------- HPI Details Mckendree, Date of Service: 03/17/2016 8:15 AM Patient Name: [name] Patient Account Number: [id number] Medical Record Treating RN: Baruch Gouty, RN, [name], Velva Harman [id number] Number: Other Clinician: Date of Birth/Sex: [birth year] (65 y.o. Male) Treating Beverly Suriano Primary [name] Physician/Extender: Tonye Pearson Physician: Referring Physician: Doristine Locks in Treatment: 10 History of Present Illness Location: open wound just above his right ankle medially Quality: Patient tells me he is not having a significant amount of pain at this point in time. Severity: 1 out of 10 Duration: This just reopened in Mark past week. Timing: Pain in wound is Intermittent Context: Mark wound appeared gradually over time Modifying Factors: Consults to this date include: he was seen in Mark ER and was referred to a vascular surgeon but Mark patient has not done that. He may have been treated with clindamycin in Mark ER. Associated Signs and Symptoms: Patient reports having difficulty standing for long periods. HPI Description: 65 year old gentleman who was seen in Mark emergency department recently on 01/06/2015 for a wound of his right lower extremity which he says was not involving any injury and he did not know how he sustained it. He had draining foul-smelling liquid from Mark area and had gone for [name] there. his past medical history is significant for DVT,  hypertension, gout, tobacco abuse, cocaine abuse, stroke, atrial fibrillation, pulmonary embolism. he has also had some vascular surgery with a stent placed in his leg. He has been a smoker for many years and has given up straight drugs several years ago. He continues to smoke about 4-5 cigarettes a day. 02/03/2015 -- received a note from 05/14/2013 where Dr. Leotis Pain placed an inferior vena cava filter. Mark  patient had a deep vein thrombosis while therapeutic on anticoagulation for previous DVT and a IVC filter was placed for this. 02/10/2015 -- he did have his vascular test done on Friday but we have no reports yet. 02/17/2015 -- notes were reviewed from Mark vascular office and Mark patient had a venous ultrasound done which revealed that he had no reflux in Mark greater saphenous vein or Mark short saphenous vein bilaterally. He did have subacute DVT in Mark common femoral vein and popliteal veins on Mark right and left side. Mark recommendation was to continue with Unna's boot therapy at Mark wound clinic and then to wear graduated compression stockings once Mark ulcers healed and later if he had continuous problems lymphedema pump would benefit him. 03/17/2015 -- we have applied for his insurance and aide regarding cellular tissue-based products and are still awaiting Mark final clearance. 03/24/2015 -- he has had Apligraf authorized for him but his wound is looking so good today that we may not use it. 03/31/2015 -- he has not yet received his compression stockings though we have called a couple of times and hopefully they should arrive this week. [name] ([id number]) [place] 01/06/16; this is a patient we have previously cared for in this clinic with wounds on his right medial ankle. I was not previously involved with his [name]. 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 He wears  compression stockings. He had reflux studies done during his last stay in this clinic which did not show significant reflux in Mark greater or lesser saphenous veins bilaterally. His history is that he developed a open sore on Mark left medial malleolus one week ago. He was seen in his primary physician office and given a course of doxycycline which he still should be on. Previously seen vascular surgery who felt that he had some degree of lymphedema as well. He is not a diabetic 01/13/16 no major change 01/20/16; very small wound on Mark medial right ankle again covered with surface slough that doesn't seem to be spotting Mark Prisma 01/27/16; patient comes in today complaining of a lot of pain around Mark wound site. He has not been systemically unwell. 02/03/16; Mark patient's wound culture last week grew Proteus, I had empirically given doxycycline. Mark Proteus was not specifically plated against doxycycline however Proteus itself was fairly pansensitive and Mark patient comes back feeling a lot better today. I think Mark doxycycline was likely to be successful in sufficient 02/10/16; as predicted last week Mark area has closed over. These are probably venous insufficiency wounds although his previous reflux studies did not show superficial reflux. He also has a history of DVT and at one time had a Greenfield filter in place. Mark area in question on his left medial ankle region. It became secondarily infected but responded nicely to antibiotics. He is closed today 02/17/16 unfortunately patient's venous wound on Mark medial aspect of his right ankle at this point in time has reopened. He has been using some compression hose which appear to be very light that he purchased he tells me out of a magazine. He seems a little frustrated with Mark fact that this has reopened and is concerned about his left lower extremity possibly reopening as well. 02/25/16 patient presents today for follow-up evaluation regarding  his right ankle wound. Currently he shows no interval signs or symptoms of infection. We have  been compression wrapping him unfortunately Mark wraps that we had on him last week and he has a significant amount of swelling above whether this had slipped down to. He also notes that he's been having some burning as well at Mark wound site. He rates his discomfort at this point in time to be a 2-3 out of 10. Otherwise he has no other worsening symptoms. 03/03/16; this is a patient that had a wound on his left medial ankle that I discharged on 02/10/16. He apparently reappeared Mark next week with open areas on his right medial ankle. Her intake nurse reports today that he has a lot of drainage and odor at intake even after Mark wound was cleaned. Also of note Mark patient complains of edema in Mark left leg and showed up with only one of Mark 2 layer compression system. 03/05/2016 -- since his visit 2 days ago to see Dr. Dellia Nims he complained of significant pain in his right lower extremity which was much more than he's ever had before. He came in for an urgent visit to review his condition. He has been placed on doxycycline empirically and his culture reports were reviewed but Mark final result is not back. 03/10/16; patient was in last week to see Dr. Con Memos with increasing pain in his leg. He was reduced to a 3 layer compression from 4 which seems to have helped overall. Culture from last week grew again pansensitive Proteus, this should've been sensitive to Mark doxycycline I gave him and he is finishing that today. Mark patient is had previous arterial and venous review by vascular surgery. Patient is currently using Aquacel Ag under a 3 layer compression. 03/17/16; patient's wound dimensions are down this week. He has been using silver alginate Tarrant, [name] ([id number]) Electronic Signature(s) Signed: 03/17/2016 5:04:57 PM By: [name] MD Entered By: [name] on 03/17/2016  09:17:49 Sanor, [name] ([id number]) -------------------------------------------------------------------------------- Physical Exam Details [place]deen, Date of Service: 03/17/2016 8:15 AM Patient Name: [name] Patient Account Number: [id number] Medical Record Treating RN: Baruch Gouty RN, [name], Velva Harman [id number] Number: Other Clinician: Date of Birth/Sex: April 10, 1951 (65 y.o. Male) Treating Dellia Nims, Jaedyn Marrufo Primary [name] Physician/Extender: Tonye Pearson Physician: Referring Physician: Doristine Locks in Treatment: 10 Constitutional Patient is hypertensive.. Pulse regular and within target range for patient.[name] Respirations regular, non-labored and within target range.. Temperature is normal and within Mark target range for Mark patient.. Patient's appearance is neat and clean. Appears in no acute distress. Well nourished and well developed.. Notes Wound exam; Mark right medial ankle wound is smaller. Wound is debrided of thick circumferential skin and subcutaneous tissue as well as surface slough. Cleans up quite nicely post debridement. Peripheral pulses are palpable there is no evidence of infection Electronic Signature(s) Signed: 03/17/2016 5:04:57 PM By: [name] MD Entered By: [name] on 03/17/2016 09:20:07 Mulroy, [name] ([id number]) -------------------------------------------------------------------------------- Physician Orders Details Pennick, Date of Service: 03/17/2016 8:15 AM Patient Name: [name] Patient Account Number: [id number] Medical Record Treating RN: Baruch Gouty RN, [name], Velva Harman [id number] Number: Other Clinician: Date of Birth/Sex: [birth year] (65 y.o. Male) Treating Ericha Whittingham Primary [name] Physician/Extender: Tonye Pearson Physician: Referring Physician: Doristine Locks in Treatment: 10 Verbal / Phone Orders: Yes Clinician: Afful, RN, [name], Rita Read Back and Verified: Yes Diagnosis Coding Wound Cleansing Wound #3 Right Lower Leg o  Clean wound with Normal Saline. o Cleanse wound with mild soap and water - at clinic Anesthetic Wound #3 Right Lower Leg o Topical Lidocaine 4% cream applied to wound bed prior  to debridement Skin Barriers/Peri-Wound [name] Wound #3 Right Lower Leg o Barrier cream Primary Wound Dressing Wound #3 Right Lower Leg o Aquacel Ag Secondary Dressing Wound #3 Right Lower Leg o ABD pad o Dry Gauze Dressing Change Frequency Wound #3 Right Lower Leg o Change dressing every week Follow-up Appointments Wound #3 Right Lower Leg o Return Appointment in 1 week. Edema Control Wound #3 Right Lower Leg Kendall, Olander ([id number]) o 3 Layer Compression System - Right Lower Extremity - unna to anchor Additional Orders / Instructions Wound #3 Right Lower Leg o Increase protein intake. Medications-please add to medication list. Wound #3 Right Lower Leg o P.O. Antibiotics - Doxycycline Electronic Signature(s) Signed: 03/17/2016 5:04:57 PM By: [name] MD Signed: 03/17/2016 5:51:44 PM By: [name] [name], RN Entered By: [name] on 03/17/2016 08:47:29 Smejkal, [name] ([id number]) -------------------------------------------------------------------------------- Problem List Details Leija, Date of Service: 03/17/2016 8:15 AM Patient Name: [facility] Patient Account Number: [id number] Medical Record Treating RN: Baruch Gouty RN, [name], Velva Harman [id number] Number: Other Clinician: Date of Birth/Sex: Mar 22, 1951 (65 y.o. 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 to 03/03/2016 Yes breakdown of skin I87.331 Chronic venous hypertension (idiopathic) with ulcer and 03/03/2016 Yes inflammation of right lower extremity Inactive  Problems Resolved Problems ICD-10 Code Description Active Date Resolved Date L97.321 Non-pressure chronic ulcer of left ankle limited to 01/06/2016 01/06/2016 breakdown of skin I87.332 Chronic venous hypertension (idiopathic) with ulcer and 01/06/2016 01/06/2016 inflammation of left lower extremity Electronic Signature(s) Signed: 03/17/2016 5:04:57 PM By: [name] MD Entered By: [name] on 03/17/2016 09:10:12 Scalf, [name] ([id number]) -------------------------------------------------------------------------------- Progress Note Details Ionescu, Date of Service: 03/17/2016 8:15 AM Patient Name: [name] Patient Account Number: [id number] Medical Record Treating RN: Baruch Gouty, RN, [name], Velva Harman [id number] Number: Other Clinician: Date of Birth/Sex: [birth year] (65 y.o. Male) Treating Solymar Grace Primary [name] Physician/Extender: Tonye Pearson Physician: Referring Physician: Doristine Locks in Treatment: 10 Subjective Chief Complaint Information obtained from Patient Patient follows up today due to reopening of Mark left medial venous ulcer History of Present Illness (HPI) Mark following HPI elements were documented for Mark patient's wound: Location: open wound just above his right ankle medially Quality: Patient tells me he is not having a significant amount of pain at this point in time. Severity: 1 out of 10 Duration: This just reopened in Mark past week. Timing: Pain in wound is Intermittent Context: Mark wound appeared gradually over time Modifying Factors: Consults to this date include: he was seen in Mark ER and was referred to a vascular surgeon but Mark patient has not done that. He may have been treated with clindamycin in Mark ER. Associated Signs and Symptoms: Patient reports having difficulty standing for long periods. 65 year old gentleman who was seen in Mark emergency department recently on 01/06/2015 for a wound of his right lower extremity which he  says was not involving any injury and he did not know how he sustained it. He had draining foul-smelling liquid from Mark area and had gone for [name] there. his past medical history is significant for DVT, hypertension, gout, tobacco abuse, cocaine abuse, stroke, atrial fibrillation, pulmonary embolism. he has also had some vascular surgery with a stent placed in his leg. He has been a smoker for many years and has given up straight drugs several years ago. He continues to smoke about 4-5 cigarettes a day.  02/03/2015 -- received a note from 05/14/2013 where Dr. Leotis Pain placed an inferior vena cava filter. Mark patient had a deep vein thrombosis while therapeutic on anticoagulation for previous DVT and a IVC filter was placed for this. 02/10/2015 -- he did have his vascular test done on Friday but we have no reports yet. 02/17/2015 -- notes were reviewed from Mark vascular office and Mark patient had a venous ultrasound done which revealed that he had no reflux in Mark greater saphenous vein or Mark short saphenous vein bilaterally. He did have subacute DVT in Mark common femoral vein and popliteal veins on Mark right and left side. Mark recommendation was to continue with Unna's boot therapy at Mark wound clinic and then to wear graduated compression stockings once Mark ulcers healed and later if he had continuous problems lymphedema pump would benefit him. IDRISSA, BEVILLE ([id number]) 03/17/2015 -- we have applied for his insurance and aide regarding cellular tissue-based products and are still awaiting Mark final clearance. 03/24/2015 -- he has had Apligraf authorized for him but his wound is looking so good today that we may not use it. 03/31/2015 -- he has not yet received his compression stockings though we have called a couple of times and hopefully they should arrive this week. READMISSION 01/06/16; this is a patient we have previously cared for in this clinic with wounds on his right medial  ankle. I was not previously involved with his [name]. 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 He wears compression stockings. He had reflux studies done during his last stay in this clinic which did not show significant reflux in Mark greater or lesser saphenous veins bilaterally. His history is that he developed a open sore on Mark left medial malleolus one week ago. He was seen in his primary physician office and given a course of doxycycline which he still should be on. Previously seen vascular surgery who felt that he had some degree of lymphedema as well. He is not a diabetic 01/13/16 no major change 01/20/16; very small wound on Mark medial right ankle again covered with surface slough that doesn't seem to be spotting Mark Prisma 01/27/16; patient comes in today complaining of a lot of pain around Mark wound site. He has not been systemically unwell. 02/03/16; Mark patient's wound culture last week grew Proteus, I had empirically given doxycycline. Mark Proteus was not specifically plated against doxycycline however Proteus itself was fairly pansensitive and Mark patient comes back feeling a lot better today. I think Mark doxycycline was likely to be successful in sufficient 02/10/16; as predicted last week Mark area has closed over. These are probably venous insufficiency wounds although his previous reflux studies did not show superficial reflux. He also has a history of DVT and at one time had a Greenfield filter in place. Mark area in question on his left medial ankle region. It became secondarily infected but responded nicely to antibiotics. He is closed today 02/17/16 unfortunately patient's venous wound on Mark medial aspect of his right ankle at this point in time has reopened. He has been using some compression hose which appear to be very light that he purchased he tells me out of a magazine. He seems  a little frustrated with Mark fact that this has reopened and is concerned about his left lower extremity possibly reopening as well. 02/25/16 patient presents today for follow-up  evaluation regarding his right ankle wound. Currently he shows no interval signs or symptoms of infection. We have been compression wrapping him unfortunately Mark wraps that we had on him last week and he has a significant amount of swelling above whether this had slipped down to. He also notes that he's been having some burning as well at Mark wound site. He rates his discomfort at this point in time to be a 2-3 out of 10. Otherwise he has no other worsening symptoms. 03/03/16; this is a patient that had a wound on his left medial ankle that I discharged on 02/10/16. He apparently reappeared Mark next week with open areas on his right medial ankle. Her intake nurse reports today that he has a lot of drainage and odor at intake even after Mark wound was cleaned. Also of note Mark patient complains of edema in Mark left leg and showed up with only one of Mark 2 layer compression system. 03/05/2016 -- since his visit 2 days ago to see Dr. Dellia Nims he complained of significant pain in his right lower extremity which was much more than he's ever had before. He came in for an urgent visit to review his condition. He has been placed on doxycycline empirically and his culture reports were reviewed but Mark final result is not back. 03/10/16; patient was in last week to see Dr. Con Memos with increasing pain in his leg. He was reduced to a 3 Durand, Zohair ([id number]) layer compression from 4 which seems to have helped overall. Culture from last week grew again pansensitive Proteus, this should've been sensitive to Mark doxycycline I gave him and he is finishing that today. Mark patient is had previous arterial and venous review by vascular surgery. Patient is currently using Aquacel Ag under a 3 layer compression. 03/17/16; patient's  wound dimensions are down this week. He has been using silver alginate Objective Constitutional Patient is hypertensive.. Pulse regular and within target range for patient.[name] Respirations regular, non-labored and within target range.. Temperature is normal and within Mark target range for Mark patient.. Patient's appearance is neat and clean. Appears in no acute distress. Well nourished and well developed.. Vitals Time Taken: 8:23 AM, Height: 74 in, Weight: 240 lbs, BMI: 30.8, Temperature: 97.5 F, Pulse: 64 bpm, Respiratory Rate: 19 breaths/min, Blood Pressure: 149/92 mmHg. General Notes: Wound exam; Mark right medial ankle wound is smaller. Wound is debrided of thick circumferential skin and subcutaneous tissue as well as surface slough. Cleans up quite nicely post debridement. Peripheral pulses are palpable there is no evidence of infection Integumentary (Hair, Skin) Wound #3 status is Open. Original cause of wound was Gradually Appeared. Mark wound is located on Mark Right Lower Leg. Mark wound measures 1.8cm length x 0.7cm width x 0.2cm depth; 0.99cm^2 area and 0.198cm^3 volume. Mark wound is limited to skin breakdown. There is no tunneling noted. There is a medium amount of serosanguineous drainage noted. Mark wound margin is flat and intact. There is medium (34-66%) red, pink, pale granulation within Mark wound bed. There is a small (1-33%) amount of necrotic tissue within Mark wound bed including Adherent Slough. Mark periwound skin appearance exhibited: Induration, Scarring, Dry/Scaly, Moist. Mark periwound skin appearance did not exhibit: Callus, Crepitus, Excoriation, Fluctuance, Friable, Localized Edema, Rash, Maceration, Atrophie Blanche, Cyanosis, Ecchymosis, Hemosiderin Staining, Mottled, Pallor, Rubor, Erythema. Periwound temperature was noted as No Abnormality. Mark periwound has tenderness on palpation. Assessment Active Problems ICD-10 U13.244 - Non-pressure chronic ulcer of right  ankle limited to breakdown of  skin I87.331 - Chronic venous hypertension (idiopathic) with ulcer and inflammation of right lower extremity Age, Thaniel ([id number]) Procedures Wound #3 Wound #3 is a Venous Leg Ulcer located on Mark Right Lower Leg . There was a Skin/Subcutaneous Tissue Debridement ([id number]) debridement with total area of 1.26 sq cm performed by Ricard Dillon, MD. with Mark following instrument(s): Curette including Exudate, Fibrin/Slough, and Subcutaneous after achieving pain control using Lidocaine 4% Topical Solution. A time out was conducted at 08:43, prior to Mark start of Mark procedure. A Minimum amount of bleeding was controlled with Pressure. Mark procedure was tolerated well with a pain level of 0 throughout and a pain level of 0 following Mark procedure. Post Debridement Measurements: 1.8cm length x 0.7cm width x 0.2cm depth; 0.198cm^3 volume. Character of Wound/Ulcer Post Debridement is stable. Severity of Tissue Post Debridement is: Fat layer exposed. Post procedure Diagnosis Wound #3: Same as Pre-Procedure Plan Wound Cleansing: Wound #3 Right Lower Leg: Clean wound with Normal Saline. Cleanse wound with mild soap and water - at clinic Anesthetic: Wound #3 Right Lower Leg: Topical Lidocaine 4% cream applied to wound bed prior to debridement Skin Barriers/Peri-Wound [name]: Wound #3 Right Lower Leg: Barrier cream Primary Wound Dressing: Wound #3 Right Lower Leg: Aquacel Ag Secondary Dressing: Wound #3 Right Lower Leg: ABD pad Dry Gauze Dressing Change Frequency: Wound #3 Right Lower Leg: Change dressing every week Follow-up Appointments: Wound #3 Right Lower Leg: Return Appointment in 1 week. Goin, [name] ([id number]) Edema Control: Wound #3 Right Lower Leg: 3 Layer Compression System - Right Lower Extremity - unna to anchor Additional Orders / Instructions: Wound #3 Right Lower Leg: Increase protein intake. Medications-please add  to medication list.: Wound #3 Right Lower Leg: P.O. Antibiotics - Doxycycline #1 I see no reason to change current dressing which is silver alginate. May require debridement next week. Wound margins debrided today. Electronic Signature(s) Signed: 03/17/2016 5:04:57 PM By: [name] MD Entered By: [name] on 03/17/2016 09:22:36 Pfahler, [name] ([id number]) -------------------------------------------------------------------------------- SuperBill Details [place], Date of Service: 03/17/2016 Patient Name: [facility] Patient Account Number: [id number] Medical Record Treating RN: Baruch Gouty RN, [name], Velva Harman [id number] Number: Other Clinician: Date of Birth/Sex: 24-Mar-1951 (65 y.o. 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 venous hypertension (idiopathic) with ulcer and inflammation of right lower I87.331 extremity Facility Procedures CPT4 Code Description: 25498264 11042 - DEB SUBQ TISSUE 20 SQ CM/< ICD-10 Description Diagnosis L97.311 Non-pressure chronic ulcer of right ankle limited t Modifier: o breakdown o Quantity: 1 f skin Physician Procedures CPT4 Code Description: 1583094 07680 - WC PHYS SUBQ TISS 20 SQ CM ICD-10 Description Diagnosis S81.103 Non-pressure chronic ulcer of right ankle limited t Modifier: o breakdown o Quantity: 1 f skin Electronic Signature(s) Signed: 03/17/2016 5:04:57 PM By: [name] MD Entered By: [name] on 03/17/2016 15:94:58

## 2016-03-24 ENCOUNTER — Encounter: Payer: Medicare Other | Admitting: Nurse Practitioner

## 2016-03-24 DIAGNOSIS — I87331 Chronic venous hypertension (idiopathic) with ulcer and inflammation of right lower extremity: Secondary | ICD-10-CM | POA: Diagnosis not present

## 2016-03-25 NOTE — Progress Notes (Signed)
[name] ([id number]) Visit Report for 03/24/2016 Arrival Information Details Patient Name: [name] Date of Service: 03/24/2016 8:15 AM Medical Record Number: [id number] Patient Account Number: [id number] Date of Birth/Sex: [birth year] (65 y.o. Male) Treating RN: Baruch Gouty, RN, [name], Velva Harman Primary Care Physician: Royetta Crochet Other Clinician: Referring Physician: Royetta Crochet Treating Physician/Extender: Cathie Olden in Treatment: 11 Visit Information History Since Last Visit All ordered tests and consults were completed: No Patient Arrived: [name] Added or deleted any medications: No Arrival Time: 08:12 Any new allergies or adverse reactions: No Accompanied By: self Had a fall or experienced change in No Transfer Assistance: None activities of daily living that may affect Patient Identification Verified: Yes risk of falls: Secondary Verification Process Yes Signs or symptoms of abuse/neglect since last No Completed: visito Patient Requires Transmission- No Hospitalized since last visit: No Based Precautions: Has Dressing in Place as Prescribed: Yes Patient Has Alerts: Yes Has Compression in Place as Prescribed: Yes Patient Alerts: Patient on Blood Pain Present Now: No Thinner warfarin Electronic Signature(s) Signed: 03/24/2016 5:08:11 PM By: [name] [name], RN Entered By: [name] on 03/24/2016 08:12:19 [name], [name] ([id number]) -------------------------------------------------------------------------------- Encounter Discharge Information Details Patient Name: [name] Date of Service: 03/24/2016 8:15 AM Medical Record Number: [id number] Patient Account Number: [id number] Date of Birth/Sex: 1950/09/10 (65 y.o. Male) Treating RN: Baruch Gouty, RN, [name], Velva Harman Primary Care Physician: Royetta Crochet Other Clinician: Referring Physician: Royetta Crochet Treating Physician/Extender: Cathie Olden in Treatment: 11 Encounter Discharge  Information Items Discharge Pain Level: 0 Discharge Condition: Stable Ambulatory Status: Cane Discharge Destination: Home Transportation: Private Auto Accompanied By: self Schedule Follow-up Appointment: No Medication Reconciliation completed No and provided to Patient/Care Calob Baskette: Provided on Clinical Summary of Care: 03/24/2016 Form Type Recipient Paper Patient WD Electronic Signature(s) Signed: 03/24/2016 5:08:11 PM By: [name] [name], RN Previous Signature: 03/24/2016 8:52:43 AM Version By: Ruthine Dose Entered By: [name] on 03/24/2016 08:55:59 Pick, [name] ([id number]) -------------------------------------------------------------------------------- Lower Extremity Assessment Details Patient Name: [name] Date of Service: 03/24/2016 8:15 AM Medical Record Number: [id number] Patient Account Number: [id number] Date of Birth/Sex: [birth year] (65 y.o. Male) Treating RN: Baruch Gouty, RN, [name], Velva Harman Primary Care Physician: Royetta Crochet Other Clinician: Referring Physician: Royetta Crochet Treating Physician/Extender: Cathie Olden in Treatment: 11 Edema Assessment Assessed: [Left: No] [Right: No] E[Left: dema] [Right: :] Calf Left: Right: Point of Measurement: 36 cm From Medial Instep cm 42.1 cm Ankle Left: Right: Point of Measurement: 14 cm From Medial Instep cm 28.1 cm Vascular Assessment Claudication: Claudication Assessment [Right:None] Pulses: Posterior Tibial Dorsalis Pedis Palpable: [Right:Yes] Extremity colors, hair growth, and conditions: Extremity Color: [Right:Dusky] Hair Growth on Extremity: [Right:Yes] Temperature of Extremity: [Right:Warm] Capillary Refill: [Right:< 3 seconds] Toe Nail Assessment Left: Right: Thick: Yes Discolored: Yes Deformed: No Improper Length and Hygiene: No Electronic Signature(s) Signed: 03/24/2016 5:08:11 PM By: [name] [name], RN Entered By: [name] on 03/24/2016 08:13:03 [name], [name]  ([id number]) [name], [name] ([id number]) -------------------------------------------------------------------------------- Multi Wound Chart Details Patient Name: [name] Date of Service: 03/24/2016 8:15 AM Medical Record Number: [id number] Patient Account Number: [id number] Date of Birth/Sex: 03-Jan-1951 (65 y.o. 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] [N/A:N/A]  Comorbid History: [3:Arrhythmia, Hypertension, N/A Gout, Neuropathy] Date Acquired: [3:02/11/2016] [N/A:N/A] Weeks of Treatment: [3:5] [N/A:N/A] Wound Status: [3:Open] [N/A:N/A] Clustered Wound: [3:Yes] [N/A:N/A] Measurements L x W x D 1.5x0.7x0.2 [N/A:N/A] (cm) Area (cm) : [3:0.825] [N/A:N/A] Volume (cm) : [3:0.165] [N/A:N/A] % Reduction in Area: [3:84.90%] [N/A:N/A] % Reduction in Volume: 69.80% [N/A:N/A] Classification: [3:Full Thickness Without Exposed Support Structures] [N/A:N/A] Exudate Amount: [3:Medium] [N/A:N/A] Exudate Type: [3:Serosanguineous] [N/A:N/A] Exudate Color: [3:red, brown] [N/A:N/A] Wound Margin: [3:Flat and Intact] [N/A:N/A] Granulation Amount: [3:Medium (34-66%)] [N/A:N/A] Granulation Quality: [3:Red, Pink, Pale] [N/A:N/A] Necrotic Amount: [3:Small (1-33%)] [N/A:N/A] Exposed Structures: [3:Fascia: No Fat: No Tendon: No Muscle: No] [N/A:N/A] Joint: No Bone: No Limited to Skin Breakdown Epithelialization: Medium (34-66%) N/A N/A Periwound Skin Texture: Induration: Yes N/A  N/A Scarring: Yes Edema: No Excoriation: No Callus: No Crepitus: No Fluctuance: No Friable: No Rash: No Periwound Skin Moist: Yes N/A N/A Moisture: Dry/Scaly: Yes Maceration: No Periwound Skin Color: Hemosiderin Staining: Yes N/A N/A Mottled: Yes Atrophie Blanche: No Cyanosis: No Ecchymosis: No Erythema: No Pallor: No Rubor: No Temperature: No Abnormality N/A N/A Tenderness on Yes N/A N/A Palpation: Wound Preparation: Ulcer Cleansing: N/A N/A Rinsed/Irrigated with Saline, Other: Soap and water Topical Anesthetic Applied: Other: lidocaine 4% Treatment Notes Electronic Signature(s) Signed: 03/24/2016 5:08:11 PM By: [name] [name], RN Entered By: [name] on 03/24/2016 08:35:53 Queener, [name] ([id number]) -------------------------------------------------------------------------------- Multi-Disciplinary Care Plan Details Patient Name: [name] Date of Service: 03/24/2016 8:15 AM Medical Record Number: [id number] Patient Account Number: [id number] Date of Birth/Sex: [birth year] (65 y.o. Male) Treating RN: Baruch Gouty, RN, [name], Velva Harman Primary Care Physician: Royetta Crochet Other Clinician: Referring Physician: Royetta Crochet Treating Physician/Extender: Cathie Olden in Treatment: 11 Active Inactive Venous Leg Ulcer Nursing Diagnoses: Actual venous Insuffiency (use after diagnosis is confirmed) Goals: Patient will maintain optimal edema control Date Initiated: 02/17/2016 Goal Status: Active Interventions: Compression as ordered Notes: Wound/Skin Impairment Nursing Diagnoses: Impaired tissue integrity Goals: Patient/caregiver will verbalize understanding of skin care regimen Date Initiated: 02/17/2016 Goal Status: Active Ulcer/skin breakdown will have a volume reduction of 30% by week 4 Date Initiated: 02/17/2016 Goal Status: Active Interventions: Assess ulceration(s) every visit Notes: Electronic Signature(s) Signed: 03/24/2016 5:08:11 PM  By: [name] [name], RN Entered By: [name] on 03/24/2016 08:35:40 [place], [name] ([id number]) Ruppe, [name] ([id number]) -------------------------------------------------------------------------------- Pain Assessment Details Patient Name: [name] Date of Service: 03/24/2016 8:15 AM Medical Record Number: [id number] Patient Account Number: [id number] Date of Birth/Sex: 1950-12-19 (65 y.o. Male) Treating RN: Baruch Gouty, RN, [name], Velva Harman Primary Care Physician: Royetta Crochet Other Clinician: Referring Physician: Royetta Crochet Treating Physician/Extender: Cathie Olden in Treatment: 11 Active Problems Location of Pain Severity and Description of Pain Patient Has Paino No Site Locations With Dressing Change: No Pain Management and Medication Current Pain Management: Electronic Signature(s) Signed: 03/24/2016 5:08:11 PM By: [name] [name], RN Entered By: [name] on 03/24/2016 08:12:26 [name] ([id number]) -------------------------------------------------------------------------------- Patient/Caregiver Education Details Patient Name: [name] Date of Service: 03/24/2016 8:15 AM Medical Record Number: [id number] Patient Account Number: [id number] Date of Birth/Gender: 1950/12/23 (65 y.o. Male) Treating RN: Baruch Gouty, RN, [name], Velva Harman Primary Care Physician: Royetta Crochet Other Clinician: Referring Physician: Royetta Crochet Treating Physician/Extender: Cathie Olden in Treatment: 11 Education Assessment Education Provided To: Patient Education Topics Provided Wound Debridement: Methods: Explain/Verbal Responses: State content correctly Electronic Signature(s) Signed: 03/24/2016 5:08:11 PM By: [name] [name], RN Entered By: [name] on 03/24/2016 08:56:19 [place], [name] ([id number]) -------------------------------------------------------------------------------- Wound Assessment Details Patient Name: [name] Date of Service: 03/24/2016 8:15  AM Medical Record Number: [id number] Patient Account Number: [id number] Date of Birth/Sex: Oct 26, 1950 (65 y.o. Male) Treating RN: Afful, RN, [name], Velva Harman Primary Care Physician: Royetta Crochet Other Clinician: Referring Physician: Royetta Crochet Treating Physician/Extender: Cathie Olden in Treatment: 11 Wound Status Wound Number: 3 Primary Venous Leg Ulcer Etiology: Wound Location: Right Lower Leg Wound Status: Open Wounding Event: Gradually Appeared Comorbid Arrhythmia, Hypertension, Gout, Date Acquired: 02/11/2016 History: Neuropathy Weeks Of Treatment: 5 Clustered Wound: Yes Photos Photo Uploaded By: [name] on 03/24/2016 17:10:07 Wound Measurements Length: (cm) 1.5 Width: (cm) 0.7 Depth: (cm) 0.2 Area: (cm) 0.825 Volume: (cm) 0.165 % Reduction in Area: 84.9% % Reduction in Volume: 69.8% Epithelialization: Medium (34-66%) Tunneling: No Wound Description Full Thickness Without Exposed Foul Odor Aft Classification: Support Structures Wound Margin: Flat and Intact Exudate Medium Amount: Viviano, [name] ([id number]) er Cleansing: No Exudate Type: Serosanguineous Exudate Color: red, brown Wound Bed Granulation Amount: Medium (34-66%) Exposed Structure Granulation Quality: Red, Pink, Pale Fascia Exposed: No Necrotic Amount: Small (1-33%) Fat Layer Exposed: No Necrotic Quality: Adherent Slough Tendon Exposed: No Muscle Exposed: No Joint Exposed: No Bone Exposed: No Limited to Skin Breakdown Periwound Skin Texture Texture Color No Abnormalities Noted: No No Abnormalities Noted: No Callus: No Atrophie Blanche: No Crepitus: No Cyanosis: No Excoriation: No Ecchymosis: No Fluctuance: No Erythema: No Friable: No Hemosiderin Staining: Yes Induration: Yes Mottled: Yes Localized Edema: No Pallor: No Rash: No Rubor: No Scarring: Yes Temperature / Pain Moisture Temperature: No Abnormality No  Abnormalities Noted: No Tenderness on Palpation: Yes Dry / Scaly: Yes Maceration: No Moist: Yes Wound Preparation Ulcer Cleansing: Rinsed/Irrigated with Saline, Other: Soap and water, Topical Anesthetic Applied: Other: lidocaine 4%, Treatment Notes Wound #3 (Right Lower Leg) 1. Cleansed with: Cleanse wound with antibacterial soap and water 3. Peri-wound Care: Barrier cream Moisturizing lotion 4. Dressing Applied: Prisma Ag 5. Secondary Dressing Applied ABD Pad [name], Nasean ([id number]) 7. Secured with 3 Layer Compression System - Right Lower Extremity Electronic Signature(s) Signed: 03/24/2016 5:08:11 PM By: [name] [name], RN Entered By: [name] on 03/24/2016 08:25:26 Beadle, [name] ([id number]) -------------------------------------------------------------------------------- Vitals Details Patient Name: [name] Date of Service: 03/24/2016 8:15 AM Medical Record Number: [id number] Patient Account Number: [id number] Date of Birth/Sex: [birth year] (65 y.o. Male) Treating RN: Afful, RN, [name], Velva Harman Primary Care Physician: Royetta Crochet Other Clinician: Referring Physician: Royetta Crochet Treating Physician/Extender: Cathie Olden in Treatment: 11 Vital Signs Time Taken: 08:14 Temperature (F): 97.6 Height (in): 74 Pulse (bpm): 77 Weight (lbs): 240 Respiratory Rate (breaths/min): 19 Body Mass Index (BMI): 30.8 Blood Pressure (mmHg): 176/106 Reference Range: 80 - 120 mg / dl Notes NP Leah made aware of BP Electronic Signature(s) Signed: 03/24/2016 5:08:11 PM By: [name] [name], RN Entered By: [name] on 03/24/2016 08:17:02

## 2016-03-25 NOTE — Progress Notes (Addendum)
Mark, [name] ([id number]) Visit Report for 03/24/2016 Chief Complaint Document Details [place], Date of Service: 03/24/2016 8:15 AM Patient Name: [name] Patient Account Number: [id number] Medical Record Afful, RN, [name], [id number] Treating RN: Number: Velva Harman Date of Birth/Sex: September 09, 1950 (65 y.o. Male) Other Clinician: Primary Care Physician: Royetta Crochet Treating Rafan Sanders, [place] Referring Physician: Royetta Crochet Physician/Extender: Suella [place] in Treatment: 11 Information Obtained from: Patient Chief Complaint Mr Streeper presents today for routine management on his RLE venous ulcer Electronic Signature(s) Signed: 03/24/2016 9:38:52 AM By: Lawanda Cousins Entered By: Lawanda Cousins on 03/24/2016 09:38:52 Seiler, [name] ([id number]) -------------------------------------------------------------------------------- Debridement Details [place], Date of Service: 03/24/2016 8:15 AM Patient Name: [name] Patient Account Number: [id number] Medical Record Afful, RN, [name], [id number] Treating RN: Number: Velva Harman Date of Birth/Sex: [birth year] (65 y.o. Male) Other Clinician: Primary Care Physician: Si Gaul, [place] Referring Physician: Royetta Crochet Physician/Extender: Weeks in Treatment: 11 Debridement Performed for Wound #3 Right Lower Leg Assessment: Performed By: Physician Lawanda Cousins, NP Debridement: Open Wound/Selective Debridement Selective Description: Pre-procedure Yes - 08:36 Verification/Time Out Taken: Start Time: 08:36 Pain Control: Lidocaine 4% Topical Solution Total Area Debrided (L x 1.7 (cm) x 0.8 (cm) = 1.36 (cm) W): Tissue and other Non-Viable, Fibrin/Slough, Subcutaneous material debrided: Instrument: Curette Bleeding: Minimum Hemostasis Achieved: Pressure End Time: 08:38 Procedural Pain: 0 Post Procedural Pain: 0 Response to Treatment: Procedure was tolerated well Post Debridement Measurements of Total Wound Length: (cm)  1.7 Width: (cm) 0.8 Depth: (cm) 0.1 Volume: (cm) 0.107 Character of Wound/Ulcer Post Improved Debridement: Severity of Tissue Post Debridement: Fat layer exposed Post Procedure Diagnosis Same as Pre-procedure Electronic Signature(s) Signed: 03/24/2016 10:48:22 AM By: [name] ([id number]) Signed: 03/24/2016 5:08:11 PM By: [name] [name], RN Previous Signature: 03/24/2016 9:37:59 AM Version By: Lawanda Cousins Entered By: Lawanda Cousins on 03/24/2016 10:48:22 [name], [name] ([id number]) -------------------------------------------------------------------------------- HPI Details [name], Date of Service: 03/24/2016 8:15 AM Patient Name: [name] Patient Account Number: [id number] Medical Record Afful, RN, [name], [id number] Treating RN: Number: Velva Harman Date of Birth/Sex: November 16, 1950 (65 y.o. Male) Other Clinician: Primary Care Physician: Si Gaul, Ivannah Zody Referring Physician: Royetta Crochet Physician/Extender: Weeks in Treatment: 11 History of Present Illness Location: open wound just above his right ankle medially Quality: Patient tells me he is not having a significant amount of pain at this point in time. Severity: 1 out of 10 Duration: This just reopened in the past week. Timing: Pain in wound is Intermittent Context: The wound appeared gradually over time Modifying Factors: Consults to this date include: he was seen in the ER and was referred to a vascular surgeon but the patient has not done that. He may have been treated with clindamycin in the ER. Associated Signs and Symptoms: Patient reports having difficulty standing for long periods. HPI Description: 65 year old gentleman who was seen in the emergency department recently on 01/06/2015 for a wound of his right lower extremity which he says was not involving any injury and he did not know how he sustained it. He had draining foul-smelling liquid from the area and had gone for  care there. his past medical history is significant for DVT, hypertension, gout, tobacco abuse, cocaine abuse, stroke, atrial fibrillation, pulmonary embolism. he has also had some vascular surgery with a stent placed in his leg. He has been a smoker for many years and has given up straight drugs several years ago. He continues to smoke about 4-5 cigarettes a day. 02/03/2015 -- received a note from 05/14/2013 where Dr. Leotis Pain placed an inferior vena  cava filter. The patient had a deep vein thrombosis while therapeutic on anticoagulation for previous DVT and a IVC filter was placed for this. 02/10/2015 -- he did have his vascular test done on Friday but we have no reports yet. 02/17/2015 -- notes were reviewed from the vascular office and the patient had a venous ultrasound done which revealed that he had no reflux in the greater saphenous vein or the short saphenous vein bilaterally. He did have subacute DVT in the common femoral vein and popliteal veins on the right and left side. The recommendation was to continue with Unna's boot therapy at the wound clinic and then to wear graduated compression stockings once the ulcers healed and later if he had continuous problems lymphedema pump would benefit him. 03/17/2015 -- we have applied for his insurance and aide regarding cellular tissue-based products and are still awaiting the final clearance. 03/24/2015 -- he has had Apligraf authorized for him but his wound is looking so good today that we may not use it. 03/31/2015 -- he has not yet received his compression stockings though we have called a couple of times and hopefully they should arrive this week. Mark, [name] ([id number]) 01/06/16; this is a patient we have previously cared for in this clinic with wounds on his right medial ankle. I was not previously involved with his care. He has a history of DVT and is on chronic Coumadin and one point had an inferior vena cava  filter I'm not sure if that is still in place. He wears compression stockings. He had reflux studies done during his last stay in this clinic which did not show significant reflux in the greater or lesser saphenous veins bilaterally. His history is that he developed a open sore on the left medial malleolus one week ago. He was seen in his primary physician office and given a course of doxycycline which he still should be on. Previously seen vascular surgery who felt that he had some degree of lymphedema as well. He is not a diabetic 01/13/16 no major change 01/20/16; very small wound on the medial right ankle again covered with surface slough that doesn't seem to be spotting the Prisma 01/27/16; patient comes in today complaining of a lot of pain around the wound site. He has not been systemically unwell. 02/03/16; the patient's wound culture last week grew Proteus, I had empirically given doxycycline. The Proteus was not specifically plated against doxycycline however Proteus itself was fairly pansensitive and the patient comes back feeling a lot better today. I think the doxycycline was likely to be successful in sufficient 02/10/16; as predicted last week the area has closed over. These are probably venous insufficiency wounds although his previous reflux studies did not show superficial reflux. He also has a history of DVT and at one time had a Greenfield filter in place. The area in question on his left medial ankle region. It became secondarily infected but responded nicely to antibiotics. He is closed today 02/17/16 unfortunately patient's venous wound on the medial aspect of his right ankle at this point in time has reopened. He has been using some compression hose which appear to be very light that he purchased he tells me out of a magazine. He seems a little frustrated with the fact that this has reopened and is concerned about his left lower extremity possibly reopening as well. 02/25/16  patient presents today for follow-up evaluation regarding his right ankle wound. Currently he shows no interval signs or symptoms of  infection. We have been compression wrapping him unfortunately the wraps that we had on him last week and he has a significant amount of swelling above whether this had slipped down to. He also notes that he's been having some burning as well at the wound site. He rates his discomfort at this point in time to be a 2-3 out of 10. Otherwise he has no other worsening symptoms. 03/03/16; this is a patient that had a wound on his left medial ankle that I discharged on 02/10/16. He apparently reappeared the next week with open areas on his right medial ankle. Her intake nurse reports today that he has a lot of drainage and odor at intake even after the wound was cleaned. Also of note the patient complains of edema in the left leg and showed up with only one of the 2 layer compression system. 03/05/2016 -- since his visit 2 days ago to see Dr. Dellia Nims he complained of significant pain in his right lower extremity which was much more than he's ever had before. He came in for an urgent visit to review his condition. He has been placed on doxycycline empirically and his culture reports were reviewed but the final result is not back. 03/10/16; patient was in last week to see Dr. Con Memos with increasing pain in his leg. He was reduced to a 3 layer compression from 4 which seems to have helped overall. Culture from last week grew again pansensitive Proteus, this should've been sensitive to the doxycycline I gave him and he is finishing that today. The patient is had previous arterial and venous review by vascular surgery. Patient is currently using Aquacel Ag under a 3 layer compression. 03/17/16; patient's wound dimensions are down this week. He has been using silver alginate 03/24/2016 - [name] arrives today for management of RLE venous ulcer. The alginate dressing  is densly adhered to the ulcer. He offers no complaints, concerns, or needs. Cundari, [name] ([id number]) Electronic Signature(s) Signed: 03/24/2016 10:40:50 AM By: Lawanda Cousins Entered By: Lawanda Cousins on 03/24/2016 10:40:49 Bristow, [name] ([id number]) -------------------------------------------------------------------------------- Physical Exam Details [place], Date of Service: 03/24/2016 8:15 AM Patient Name: [name] Patient Account Number: [id number] Medical Record Afful, RN, [name], [id number] Treating RN: Number: Velva Harman Date of Birth/Sex: 1950-06-17 (65 y.o. Male) Other Clinician: Primary Care Physician: Si Gaul, Adalena Abdulla Referring Physician: Royetta Crochet Physician/Extender: Weeks in Treatment: 11 Constitutional hypertensive, has not taken medications this morning, encouraged to take his medications once he gets home. afebrile. well nourished; well developed; appears stated age;[name] Cardiovascular palpable DP, non-palpable PT. hemosiderin staining, vericose veins present, mininmal edema. Musculoskeletal ambulated without assistance; steady gait. Psychiatric oriented to time, place, person and situation. flat affect, calm. Notes Integumentary - once densly adhered alginate removed from ulcer, ulcer reveals small amoutn of slough to central region, with small amount of granulation tissue lcoulter Electronic Signature(s) Signed: 03/24/2016 10:43:40 AM By: Lawanda Cousins Entered By: Lawanda Cousins on 03/24/2016 10:43:39 Godino, [name] ([id number]) -------------------------------------------------------------------------------- Physician Orders Details [place], Date of Service: 03/24/2016 8:15 AM Patient Name: [name] Patient Account Number: [id number] Medical Record Afful, RN, [name], [id number] Treating RN: Number: Velva Harman Date of Birth/Sex: 1950-11-05 (65 y.o. Male) Other Clinician: Primary Care Physician: Si Gaul,  Yahayra Geis Referring Physician: Royetta Crochet Physician/Extender: Weeks in Treatment: 11 Verbal / Phone Orders: Yes Clinician: Afful, RN, [name], Rita Read Back and Verified: Yes Diagnosis Coding Wound Cleansing Wound #3 Right Lower Leg o Clean wound with Normal Saline. o Cleanse wound with mild soap and  water - at clinic Anesthetic Wound #3 Right Lower Leg o Topical Lidocaine 4% cream applied to wound bed prior to debridement Skin Barriers/Peri-Wound Care Wound #3 Right Lower Leg o Barrier cream Primary Wound Dressing Wound #3 Right Lower Leg o Prisma Ag Secondary Dressing Wound #3 Right Lower Leg o ABD pad o Dry Gauze Dressing Change Frequency Wound #3 Right Lower Leg o Change dressing every week Follow-up Appointments Wound #3 Right Lower Leg o Return Appointment in 1 week. Edema Control Wound #3 Right Lower Leg o 3 Layer Compression System - Right Lower Extremity - unna to anchor Blacksher, Georgio ([id number]) Additional Orders / Instructions Wound #3 Right Lower Leg o Increase protein intake. Electronic Signature(s) Signed: 03/24/2016 5:08:11 PM By: [name] [name], RN Signed: 03/25/2016 2:25:42 AM By: Lawanda Cousins Entered By: [name] on 03/24/2016 08:38:05 Alonzo, [name] ([id number]) -------------------------------------------------------------------------------- Problem List Details [place], Date of Service: 03/24/2016 8:15 AM Patient Name: [facility] Patient Account Number: [id number] Medical Record Afful, RN, [name], [id number] Treating RN: Number: Velva Harman Date of Birth/Sex: 1950/12/14 (65 y.o. Male) Other Clinician: Primary Care Physician: Si Gaul, [place] Referring Physician: Royetta Crochet Physician/Extender: Weeks in Treatment: 11 Active Problems ICD-10 Encounter Code Description Active Date Diagnosis L97.311 Non-pressure chronic ulcer of right ankle limited to 03/03/2016 Yes breakdown of skin I87.331  Chronic venous hypertension (idiopathic) with ulcer and 03/03/2016 Yes inflammation of right lower extremity Inactive Problems Resolved Problems ICD-10 Code Description Active Date Resolved Date L97.321 Non-pressure chronic ulcer of left ankle limited to 01/06/2016 01/06/2016 breakdown of skin I87.332 Chronic venous hypertension (idiopathic) with ulcer and 01/06/2016 01/06/2016 inflammation of left lower extremity Electronic Signature(s) Signed: 03/24/2016 9:37:24 AM By: Lawanda Cousins Entered By: Lawanda Cousins on 03/24/2016 09:37:24 Bonham, [name] ([id number]) -------------------------------------------------------------------------------- Progress Note Details [place], Date of Service: 03/24/2016 8:15 AM Patient Name: [name] Patient Account Number: [id number] Medical Record Afful, RN, [name], [id number] Treating RN: Number: Velva Harman Date of Birth/Sex: 1950-12-27 (65 y.o. Male) Other Clinician: Primary Care Physician: Si Gaul, [place] Referring Physician: Royetta Crochet Physician/Extender: Weeks in Treatment: 11 Subjective Chief Complaint Information obtained from Patient Mr Green[place] presents today for routine management on his RLE venous ulcer History of Present Illness (HPI) The following HPI elements were documented for the patient's wound: Location: open wound just above his right ankle medially Quality: Patient tells me he is not having a significant amount of pain at this point in time. Severity: 1 out of 10 Duration: This just reopened in the past week. Timing: Pain in wound is Intermittent Context: The wound appeared gradually over time Modifying Factors: Consults to this date include: he was seen in the ER and was referred to a vascular surgeon but the patient has not done that. He may have been treated with clindamycin in the ER. Associated Signs and Symptoms: Patient reports having difficulty standing for long periods. 65 year old gentleman who was  seen in the emergency department recently on 01/06/2015 for a wound of his right lower extremity which he says was not involving any injury and he did not know how he sustained it. He had draining foul-smelling liquid from the area and had gone for care there. his past medical history is significant for DVT, hypertension, gout, tobacco abuse, cocaine abuse, stroke, atrial fibrillation, pulmonary embolism. he has also had some vascular surgery with a stent placed in his leg. He has been a smoker for many years and has given up straight drugs several years ago. He continues to smoke about 4-5 cigarettes a day.  02/03/2015 -- received a note from 05/14/2013 where Dr. Leotis Pain placed an inferior vena cava filter. The patient had a deep vein thrombosis while therapeutic on anticoagulation for previous DVT and a IVC filter was placed for this. 02/10/2015 -- he did have his vascular test done on Friday but we have no reports yet. 02/17/2015 -- notes were reviewed from the vascular office and the patient had a venous ultrasound done which revealed that he had no reflux in the greater saphenous vein or the short saphenous vein bilaterally. He did have subacute DVT in the common femoral vein and popliteal veins on the right and left side. The recommendation was to continue with Unna's boot therapy at the wound clinic and then to wear graduated compression stockings once the ulcers healed and later if he had continuous problems lymphedema pump would benefit him. 03/17/2015 -- we have applied for his insurance and aide regarding cellular tissue-based products and are [place]on, Latravion ([id number]) still awaiting the final clearance. 03/24/2015 -- he has had Apligraf authorized for him but his wound is looking so good today that we may not use it. 03/31/2015 -- he has not yet received his compression stockings though we have called a couple of times and hopefully they should arrive this  week. READMISSION 01/06/16; this is a patient we have previously cared for in this clinic with wounds on his right medial ankle. I was not previously involved with his care. 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 He wears compression stockings. He had reflux studies done during his last stay in this clinic which did not show significant reflux in the greater or lesser saphenous veins bilaterally. His history is that he developed a open sore on the left medial malleolus one week ago. He was seen in his primary physician office and given a course of doxycycline which he still should be on. Previously seen vascular surgery who felt that he had some degree of lymphedema as well. He is not a diabetic 01/13/16 no major change 01/20/16; very small wound on the medial right ankle again covered with surface slough that doesn't seem to be spotting the Prisma 01/27/16; patient comes in today complaining of a lot of pain around the wound site. He has not been systemically unwell. 02/03/16; the patient's wound culture last week grew Proteus, I had empirically given doxycycline. The Proteus was not specifically plated against doxycycline however Proteus itself was fairly pansensitive and the patient comes back feeling a lot better today. I think the doxycycline was likely to be successful in sufficient 02/10/16; as predicted last week the area has closed over. These are probably venous insufficiency wounds although his previous reflux studies did not show superficial reflux. He also has a history of DVT and at one time had a Greenfield filter in place. The area in question on his left medial ankle region. It became secondarily infected but responded nicely to antibiotics. He is closed today 02/17/16 unfortunately patient's venous wound on the medial aspect of his right ankle at this point in time has reopened. He has  been using some compression hose which appear to be very light that he purchased he tells me out of a magazine. He seems a little frustrated with the fact that this has reopened and is concerned about his left lower extremity possibly reopening as well. 02/25/16 patient presents today for follow-up  evaluation regarding his right ankle wound. Currently he shows no interval signs or symptoms of infection. We have been compression wrapping him unfortunately the wraps that we had on him last week and he has a significant amount of swelling above whether this had slipped down to. He also notes that he's been having some burning as well at the wound site. He rates his discomfort at this point in time to be a 2-3 out of 10. Otherwise he has no other worsening symptoms. 03/03/16; this is a patient that had a wound on his left medial ankle that I discharged on 02/10/16. He apparently reappeared the next week with open areas on his right medial ankle. Her intake nurse reports today that he has a lot of drainage and odor at intake even after the wound was cleaned. Also of note the patient complains of edema in the left leg and showed up with only one of the 2 layer compression system. 03/05/2016 -- since his visit 2 days ago to see Dr. Dellia Nims he complained of significant pain in his right lower extremity which was much more than he's ever had before. He came in for an urgent visit to review his condition. He has been placed on doxycycline empirically and his culture reports were reviewed but the final result is not back. 03/10/16; patient was in last week to see Dr. Con Memos with increasing pain in his leg. He was reduced to a 3 layer compression from 4 which seems to have helped overall. Culture from last week grew again [place], [name] ([id number]) pansensitive Proteus, this should've been sensitive to the doxycycline I gave him and he is finishing that today. The patient is had previous arterial and  venous review by vascular surgery. Patient is currently using Aquacel Ag under a 3 layer compression. 03/17/16; patient's wound dimensions are down this week. He has been using silver alginate 03/24/2016 - [name] arrives today for management of RLE venous ulcer. The alginate dressing is densly adhered to the ulcer. He offers no complaints, concerns, or needs. Objective Constitutional hypertensive, has not taken medications this morning, encouraged to take his medications once he gets home. afebrile. well nourished; well developed; appears stated age;[name] Vitals Time Taken: 8:14 AM, Height: 74 in, Weight: 240 lbs, BMI: 30.8, Temperature: 97.6 F, Pulse: 77 bpm, Respiratory Rate: 19 breaths/min, Blood Pressure: 176/106 mmHg. General Notes: NP Carnie Bruemmer made aware of BP Cardiovascular palpable DP, non-palpable PT. hemosiderin staining, vericose veins present, mininmal edema. Musculoskeletal ambulated without assistance; steady gait. Psychiatric oriented to time, place, person and situation. flat affect, calm. Integumentary - once densly adhered alginate removed from ulcer, ulcer reveals small amount of slough to central region, with small amount of granulation tissue lcoulter Integumentary (Hair, Skin) Wound #3 status is Open. Original cause of wound was Gradually Appeared. The wound is located on the Right Lower Leg. The wound measures 1.5cm length x 0.7cm width x 0.2cm depth; 0.825cm^2 area and 0.165cm^3 volume. The wound is limited to skin breakdown. There is no tunneling noted. There is a medium amount of serosanguineous drainage noted. The wound margin is flat and intact. There is medium (34-66%) red, pink, pale granulation within the wound bed. There is a small (1-33%) amount of necrotic tissue within the wound bed including Adherent Slough. The periwound skin appearance exhibited: Induration, Scarring, Dry/Scaly, Moist, Hemosiderin Staining, Mottled. The periwound skin appearance  did not exhibit: Callus, Crepitus, Excoriation, Fluctuance, Friable, Localized Edema, Rash, Maceration, Atrophie Blanche, Cyanosis, Ecchymosis, Pallor, Rubor, Erythema. Periwound temperature  was noted as No Abnormality. The periwound has tenderness on palpation. 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 There was an Open Wound debridement with total area of 1.36 sq cm performed by Lawanda Cousins, NP. with the following instrument(s): Curette to remove Non-Viable tissue/material including Fibrin/Slough and Subcutaneous after achieving pain control using Lidocaine 4% Topical Solution. A time out was conducted at 08:36, prior to the start of the procedure. A Minimum amount of bleeding was controlled with Pressure. The procedure was tolerated well with a pain level of 0 throughout and a pain level of 0 following the procedure. Post Debridement Measurements: 1.7cm length x 0.8cm width x 0.1cm depth; 0.107cm^3 volume. Character of Wound/Ulcer Post Debridement is improved. Severity of Tissue Post Debridement is: Fat layer exposed. Post procedure Diagnosis Wound #3: Same as Pre-Procedure Plan Wound Cleansing: Wound #3 Right Lower Leg: Clean wound with Normal Saline. Cleanse wound with mild soap and water - at clinic Anesthetic: Wound #3 Right Lower Leg: Topical Lidocaine 4% cream applied to wound bed prior to debridement Skin Barriers/Peri-Wound Care: Wound #3 Right Lower Leg: Barrier cream Primary Wound Dressing: Wound #3 Right Lower Leg: Prisma Ag Muma, [name] ([id number]) Secondary Dressing: Wound #3 Right Lower Leg: ABD pad Dry Gauze Dressing Change Frequency: Wound #3 Right Lower  Leg: Change dressing every week Follow-up Appointments: Wound #3 Right Lower Leg: Return Appointment in 1 week. Edema Control: Wound #3 Right Lower Leg: 3 Layer Compression System - Right Lower Extremity - unna to anchor Additional Orders / Instructions: Wound #3 Right Lower Leg: Increase protein intake. Follow-Up Appointments: A Patient Clinical Summary of Care was provided to WD Electronic Signature(s) Signed: 03/26/2016 9:45:01 AM By: Rene Kocher, NP, Macaulay Reicher Previous Signature: 03/24/2016 10:45:12 AM Version By: Lawanda Cousins Entered By: Rene Kocher, NP, Lorence Nagengast on 03/26/2016 09:45:01 Leichter, [name] ([id number]) -------------------------------------------------------------------------------- SuperBill Details Whipple, Date of Service: 03/24/2016 Patient Name: [facility] [name] Patient Account Number: [id number] Medical Record Afful, RN, [name], [id number] Treating RN: Number: Velva Harman Date of Birth/Sex: [birth year] (65 y.o. 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 Chronic venous hypertension (idiopathic) with ulcer and inflammation of right lower I87.331 extremity Facility Procedures CPT4: Description Modifier Quantity Code 70177939 [id number] - DEBRIDE WOUND 1ST 20 SQ CM OR < 1 ICD-10 Description Diagnosis I87.331 Chronic venous hypertension (idiopathic) with ulcer and inflammation of right lower extremity L97.311 Non-pressure chronic  ulcer of right ankle limited to breakdown of skin Physician Procedures CPT4: Description Modifier Quantity Code 0300923 [id number] - WC PHYS DEBR WO ANESTH 20 SQ CM 1 ICD-10 Description Diagnosis I87.331 Chronic venous hypertension (idiopathic) with ulcer and inflammation of right lower extremity L97.311 Non-pressure chronic  ulcer  of right ankle limited to breakdown of skin Electronic Signature(s) Signed: 03/24/2016 10:49:03 AM By: Lawanda Cousins Previous Signature: 03/24/2016 10:45:58 AM Version By: Lawanda Cousins Entered By: Lawanda Cousins on 03/24/2016 10:49:03

## 2016-03-31 ENCOUNTER — Encounter: Payer: Medicare Other | Admitting: Internal Medicine

## 2016-03-31 DIAGNOSIS — I87331 Chronic venous hypertension (idiopathic) with ulcer and inflammation of right lower extremity: Secondary | ICD-10-CM | POA: Diagnosis not present

## 2016-03-31 NOTE — Progress Notes (Addendum)
WESTON, KALLMAN ([id number]) Visit Report for 03/31/2016 Arrival Information Details Razon, Date of Service: 03/31/2016 8:15 AM Patient Name: [name] Patient Account Number: [id number] Medical Record Treating RN: Baruch Gouty, RN, [name], Velva Harman [id number] Number: Other Clinician: Date of Birth/Sex: Oct 04, 1950 (65 y.o. Male) Treating Dellia Nims, MICHAEL Primary Care Physician/Extender: Tonye Pearson Physician: Referring Physician: Doristine Locks in Treatment: 12 Visit Information History Since Last Visit All ordered tests and consults were completed: No Patient Arrived: Cane Added or deleted any medications: No Arrival Time: 08:19 Any new allergies or adverse reactions: No Accompanied By: self Had a fall or experienced change in No Transfer Assistance: None activities of daily living that may affect Patient Identification Verified: Yes risk of falls: Secondary Verification Process Yes Signs or symptoms of abuse/neglect since last No Completed: visito Patient Requires Transmission- No Hospitalized since last visit: No Based Precautions: Has Dressing in Place as Prescribed: Yes Patient Has Alerts: Yes Has Compression in Place as Prescribed: Yes Patient Alerts: Patient on Blood Pain Present Now: No Thinner warfarin Electronic Signature(s) Signed: 03/31/2016 8:19:19 AM By: [name] [name], RN Entered By: [name] on 03/31/2016 08:19:19 Pangle, [name] ([id number]) -------------------------------------------------------------------------------- Encounter Discharge Information Details Causey, Date of Service: 03/31/2016 8:15 AM Patient Name: [name] Patient Account Number: [id number] Medical Record Treating RN: Baruch Gouty, RN, [name], Velva Harman [id number] Number: Other Clinician: Date of Birth/Sex: 1950/08/18 (65 y.o. Male) Treating [name], MICHAEL Primary Care Physician/Extender: Tonye Pearson Physician: Referring Physician: Doristine Locks in Treatment:  12 Encounter Discharge Information Items Discharge Pain Level: 0 Discharge Condition: Stable Ambulatory Status: Cane Discharge Destination: Home Transportation: Private Auto Accompanied By: self Schedule Follow-up Appointment: No Medication Reconciliation completed and provided to Patient/Care No Dariella Gillihan: Provided on Clinical Summary of Care: 03/31/2016 Form Type Recipient Paper Patient WD Electronic Signature(s) Signed: 03/31/2016 5:16:37 PM By: [name] [name], RN Previous Signature: 03/31/2016 8:54:52 AM Version By: Ruthine Dose Entered By: [name] on 03/31/2016 08:57:07 Shropshire, [name] ([id number]) -------------------------------------------------------------------------------- Lower Extremity Assessment Details Heard, Date of Service: 03/31/2016 8:15 AM Patient Name: [name] Patient Account Number: [id number] Medical Record Treating RN: Baruch Gouty, RN, [name], Velva Harman [id number] Number: Other Clinician: Date of Birth/Sex: 09-Apr-1951 (65 y.o. Male) Treating [name], MICHAEL Primary Care Physician/Extender: Tonye Pearson Physician: Referring Physician: Doristine Locks in Treatment: 12 Edema Assessment Assessed: [Left: No] [Right: No] Edema: [Left: N] [Right: o] Calf Left: Right: Point of Measurement: 36 cm From Medial Instep cm 42.1 cm Ankle Left: Right: Point of Measurement: 14 cm From Medial Instep cm 27.4 cm Vascular Assessment Claudication: Claudication Assessment [Right:None] Pulses: Posterior Tibial Dorsalis Pedis Palpable: [Right:Yes] Extremity colors, hair growth, and conditions: Extremity Color: [Right:Mottled] Hair Growth on Extremity: [Right:Yes] Temperature of Extremity: [Right:Warm] Capillary Refill: [Right:< 3 seconds] Toe Nail Assessment Left: Right: Thick: Yes Discolored: Yes Deformed: No Improper Length and Hygiene: No Electronic Signature(s) [name], [name] ([id number]) Signed: 03/31/2016 5:16:37 PM By: [name] [name],  RN Entered By: [name] on 03/31/2016 08:43:22 Reising, [name] ([id number]) -------------------------------------------------------------------------------- Multi Wound Chart Details Forge, Date of Service: 03/31/2016 8:15 AM Patient Name: [name] Patient Account Number: [id number] Medical Record Treating RN: Baruch Gouty RN, [name], Velva Harman [id number] Number: Other Clinician: Date of Birth/Sex: 1950/08/23 (65 y.o. 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] [N/A:N/A] Comorbid History: [3:Arrhythmia, Hypertension, N/A Gout, Neuropathy] Date Acquired: [3:02/11/2016] [N/A:N/A] Weeks of Treatment: [3:6] [N/A:N/A] Wound Status: [3:Open] [N/A:N/A] Clustered Wound: [3:Yes] [N/A:N/A] Measurements L x W x D 1.4x0.5x0.2 [N/A:N/A] (cm) Area (cm) : [3:0.55] [N/A:N/A] Volume (cm) : [3:0.11] [N/A:N/A] % Reduction in Area: [3:89.90%] [N/A:N/A] % Reduction in Volume: 79.90% [N/A:N/A] Classification: [3:Full Thickness Without Exposed Support Structures] [N/A:N/A] Exudate Amount: [3:Medium] [N/A:N/A] Exudate Type: [3:Serosanguineous] [N/A:N/A] Exudate Color: [3:red, brown] [N/A:N/A] Wound Margin: [3:Flat and Intact] [N/A:N/A] Granulation Amount: [3:Medium (34-66%)] [N/A:N/A] Granulation Quality: [3:Red, Pink, Pale] [N/A:N/A] Necrotic Amount: [3:Small (1-33%)] [N/A:N/A] Exposed Structures: [N/A:N/A] Fascia: No Fat: No Tendon: No Muscle: No Joint: No Bone: No Limited to Skin Breakdown Epithelialization: Medium (34-66%) N/A N/A Periwound  Skin Texture: Induration: Yes N/A N/A Scarring: Yes Edema: No Excoriation: No Callus: No Crepitus: No Fluctuance: No Friable: No Rash: No Periwound Skin Moist: Yes N/A N/A Moisture: Dry/Scaly: Yes Maceration: No Periwound Skin Color: Hemosiderin Staining: Yes N/A N/A Mottled: Yes Atrophie Blanche: No Cyanosis: No Ecchymosis: No Erythema: No Pallor: No Rubor: No Temperature: No Abnormality N/A N/A Tenderness on Yes N/A N/A Palpation: Wound Preparation: Ulcer Cleansing: N/A N/A Rinsed/Irrigated with Saline, Other: Soap and water Topical Anesthetic Applied: Other: lidocaine 4% Treatment Notes Electronic Signature(s) Signed: 03/31/2016 5:16:37 PM By: [name] [name], RN Entered By: [name] on 03/31/2016 08:43:36 [name], [name] ([id number]) -------------------------------------------------------------------------------- Multi-Disciplinary Care Plan Details Hosier, Date of Service: 03/31/2016 8:15 AM Patient Name: [name] Patient Account Number: [id number] Medical Record Treating RN: Baruch Gouty RN, [name], Velva Harman [id number] Number: Other Clinician: Date of Birth/Sex: October 30, 1950 (65 y.o. Male) Treating [name], MICHAEL Primary Care Physician/Extender: Tonye Pearson Physician: Referring Physician: Doristine Locks in Treatment: 12 Active Inactive Venous Leg Ulcer Nursing Diagnoses: Actual venous Insuffiency (use after diagnosis is confirmed) Goals: Patient will maintain optimal edema control Date Initiated: 02/17/2016 Goal Status: Active Interventions: Compression as ordered Notes: Wound/Skin Impairment Nursing Diagnoses: Impaired tissue integrity Goals: Patient/caregiver will verbalize understanding of skin care regimen Date Initiated: 02/17/2016 Goal Status: Active Ulcer/skin breakdown will have a volume reduction of 30% by week 4 Date Initiated: 02/17/2016 Goal Status: Active Interventions: Assess ulceration(s) every visit Notes: Electronic  Signature(s) [name], [name] ([id number]) Signed: 03/31/2016 5:16:37 PM By: [name] [name], RN Entered By: [name] on 03/31/2016 08:43:28 [name], [name] ([id number]) -------------------------------------------------------------------------------- Pain Assessment Details Schiller, Date of Service: 03/31/2016 8:15 AM Patient Name: [name] Patient Account Number: [id number] Medical Record Treating RN: Baruch Gouty RN, [name], Velva Harman [id number] Number: Other Clinician: Date of Birth/Sex: [birth year] (65 y.o. Male) Treating [name], MICHAEL Primary Care Physician/Extender: Tonye Pearson Physician: Referring Physician: Doristine Locks in Treatment: 12 Active Problems Location of Pain Severity and Description of Pain Patient Has Paino No Site Locations With Dressing Change: No Pain Management and Medication Current Pain Management: Electronic Signature(s) Signed: 03/31/2016 8:19:26 AM By: [name] [name], RN Entered By: [name] on 03/31/2016 08:19:26 [name], [name] ([id number]) -------------------------------------------------------------------------------- Patient/Caregiver Education Details Genter, Date of Service: 03/31/2016 8:15 AM Patient Name: [name] Patient Account Number: [id number] Medical Record Treating RN: Baruch Gouty RN, [name], Velva Harman [id number] Number: Other Clinician: Date of Birth/Gender: 04-06-1951 (65 y.o. Male) Treating [name], MICHAEL Primary Care Physician/Extender: Tonye Pearson Physician: Suella [name] in Treatment: 12 Referring Physician: Royetta Crochet Education Assessment Education Provided To: Patient Education Topics Provided Wound Debridement: Methods: Explain/Verbal Responses: State content correctly Wound/Skin Impairment: Methods: Explain/Verbal Responses: State content correctly Electronic Signature(s) Signed: 03/31/2016 5:16:37 PM By: [name] [name], RN Entered By: [name] on 03/31/2016 08:57:21 [name],  [name]  ([id number]) -------------------------------------------------------------------------------- Wound Assessment Details Morell, Date of Service: 03/31/2016 8:15 AM Patient Name: [facility] Patient Account Number: [id number] Medical Record Treating RN: Afful, RN, [name], Velva Harman [id number] Number: Other Clinician: Date of Birth/Sex: 10-03-1950 (65 y.o. Male) Treating [name], MICHAEL Primary Care Physician/Extender: Tonye Pearson Physician: Referring Physician: Doristine Locks in Treatment: 12 Wound Status Wound Number: 3 Primary Venous Leg Ulcer Etiology: Wound Location: Right Lower Leg Wound Status: Open Wounding Event: Gradually Appeared Comorbid Arrhythmia, Hypertension, Gout, Date Acquired: 02/11/2016 History: Neuropathy Weeks Of Treatment: 6 Clustered Wound: Yes Photos Photo Uploaded By: [name] on 03/31/2016 13:56:54 Wound Measurements Length: (cm) 2 Width: (cm) 1 Depth: (cm) 0.2 Area: (cm) 1.571 Volume: (cm) 0.314 % Reduction in Area: 71.3% % Reduction in Volume: 42.6% Epithelialization: Medium (34-66%) Tunneling: No Undermining: No Wound Description Full Thickness Without Exposed Classification: Support Structures Braaksma, [name] ([id number]) Foul Odor After Cleansing: No Wound Margin: Flat and Intact Exudate Medium Amount: Exudate Type: Serosanguineous Exudate Color: red, brown Wound Bed Granulation Amount: Medium (34-66%) Exposed Structure Granulation Quality: Red, Pink, Pale Fascia Exposed: No Necrotic Amount: Small (1-33%) Fat Layer Exposed: No Necrotic Quality: Adherent Slough Tendon Exposed: No Muscle Exposed: No Joint Exposed: No Bone Exposed: No Limited to Skin Breakdown Periwound Skin Texture Texture Color No Abnormalities Noted: No No Abnormalities Noted: No Callus: No Atrophie Blanche: No Crepitus: No Cyanosis: No Excoriation: No Ecchymosis: No Fluctuance: No Erythema: No Friable: No Hemosiderin Staining:  Yes Induration: Yes Mottled: Yes Localized Edema: No Pallor: No Rash: No Rubor: No Scarring: Yes Temperature / Pain Moisture Temperature: No Abnormality No Abnormalities Noted: No Tenderness on Palpation: Yes Dry / Scaly: Yes Maceration: No Moist: Yes Wound Preparation Ulcer Cleansing: Rinsed/Irrigated with Saline, Other: Soap and water, Topical Anesthetic Applied: Other: lidocaine 4%, Treatment Notes Wound #3 (Right Lower Leg) 1. Cleansed with: Cleanse wound with antibacterial soap and water 3. Peri-wound Care: Barrier cream Moisturizing lotion 4. Dressing Applied: [name], [name] ([id number]) Hydrafera Blue 5. Secondary Dressing Applied ABD Pad 7. Secured with 3 Layer Compression System - Right Lower Extremity Electronic Signature(s) Signed: 03/31/2016 5:16:37 PM By: [name] [name], RN Entered By: [name] on 03/31/2016 08:45:35 Leib, [name] ([id number]) -------------------------------------------------------------------------------- Vitals Details Deren, Date of Service: 03/31/2016 8:15 AM Patient Name: [name] Patient Account Number: [id number] Medical Record Treating RN: Baruch Gouty, RN, [name], Velva Harman [id number] Number: Other Clinician: Date of Birth/Sex: 1950-09-18 (65 y.o. Male) Treating [name], MICHAEL Primary Care Physician/Extender: Tonye Pearson Physician: Referring Physician: Doristine Locks in Treatment: 12 Vital Signs Time Taken: 08:23 Temperature (F): 98.3 Height (in): 74 Pulse (bpm): 61 Weight (lbs): 240 Respiratory Rate (breaths/min): 19 Body Mass Index (BMI): 30.8 Blood Pressure (mmHg): 185/95 Reference Range: 80 - 120 mg / dl Notes Dr. Dellia Nims made aware of BP. Patient said he already taken his BP med this morning Electronic Signature(s) Signed: 03/31/2016 5:16:37 PM By: [name] [name], RN Entered By: [name] on 03/31/2016 08:26:02

## 2016-04-01 NOTE — Progress Notes (Signed)
[name] ([id number]) Visit Report for 03/31/2016 Chief Complaint Document Details [name], Date of [name]: 03/31/2016 8:15 AM Patient Name: [name] Patient Account Number: [id number] Medical Record Treating [name]: Baruch Gouty, [name], [name], Velva Harman [id number] Number: Other Clinician: Date of Birth/Sex: 1951/02/23 (64 y.o. Male) Treating Dellia Nims, [name] Primary Care Physician/Extender: [name] Physician: Referring Physician: Doristine [name] in Treatment: 12 Information Obtained from: Patient Chief Complaint [name] presents today for routine management on his RLE venous ulcer Electronic Signature(s) Signed: 03/31/2016 4:57:31 PM By: Mark Ham MD Entered By: [name] on 03/31/2016 08:44:40 [name], [name] ([id number]) -------------------------------------------------------------------------------- Debridement Details [place], Date of [name]: 03/31/2016 8:15 AM Patient Name: [name] Patient Account Number: [id number] Medical Record Treating [name]: Baruch Gouty [name], [name], Velva Harman [id number] Number: Other Clinician: Date of Birth/Sex: March 27, 1951 (65 y.o. Male) Treating [name] Primary Care Physician/Extender: [name] Physician: Referring Physician: Doristine [name] in Treatment: 12 Debridement Performed for Wound #3 Right Lower Leg Assessment: Performed By: Physician Mark Dillon, MD Debridement: Debridement Pre-procedure Yes - 08:40 Verification/Time Out Taken: Start Time: 08:40 [name] Control: Lidocaine 4% Topical Solution Level: Skin/Subcutaneous Tissue Total Area Debrided (L x 2 (cm) x 1 (cm) = 2 (cm) W): Tissue and other Non-Viable, Fibrin/Slough, Subcutaneous material debrided: Instrument: Curette Bleeding: Minimum Hemostasis Achieved: Pressure End Time: 08:42 Procedural [name]: 0 Post Procedural [name]: 0 Response to Treatment: Procedure was tolerated well Post Debridement Measurements of Total Wound Length: (cm)  2 Width: (cm) 1 Depth: (cm) 0.1 Volume: (cm) 0.157 Character of Wound/Ulcer Post Stable Debridement: Severity of Tissue Post Debridement: Fat layer exposed Post Procedure Diagnosis Same as Pre-procedure Electronic Signature(s) Signed: 03/31/2016 4:57:31 PM By: Mark Ham MD [name] ([id number]) Signed: 03/31/2016 5:16:37 PM By: [name] Entered By: [name] on 03/31/2016 08:44:50 [name] -------------------------------------------------------------------------------- HPI Details [name]: 03/31/2016 8:15 AM Patient Name: [name] Patient Account Number: [id number] Medical Record Treating [name]: Baruch Gouty, [name], [name], Velva Harman [id number] Number: Other Clinician: Date of Birth/Sex: Sep 20, 1950 (65 y.o. Male) Treating [name] Primary Care Physician/Extender: [name] Physician: Referring Physician: Doristine [name] in Treatment: 12 History of Present Illness Location: open wound just above his right ankle medially Quality: Patient tells me he is not having a significant amount of [name] at this point in time. Severity: 1 out of 10 Duration: This just reopened in the past week. Timing: [name] in wound is Intermittent Context: The wound appeared gradually over time Modifying Factors: Consults to this date include: he was seen in the ER and was referred to a vascular surgeon but the patient has not done that. He may have been treated with clindamycin in the ER. Associated Signs and Symptoms: Patient reports having difficulty standing for long periods. HPI Description: 65 year old gentleman who was seen in the emergency department recently on 01/06/2015 for a wound of his right lower extremity which he says was not involving any injury and he did not know how he sustained it. He had draining foul-smelling liquid from the area and had gone for care there. his past medical history is significant for DVT,  hypertension, gout, tobacco abuse, cocaine abuse, stroke, atrial fibrillation, pulmonary embolism. he has also had some vascular surgery with a stent placed in his leg. He has been a smoker for many years and has given up straight drugs several years ago. He continues to smoke about 4-5 cigarettes a day. 02/03/2015 -- received a note from 05/14/2013 where Dr. Leotis [name] placed an inferior vena cava filter. The patient  had a deep vein thrombosis while therapeutic on anticoagulation for previous DVT and a IVC filter was placed for this. 02/10/2015 -- he did have his vascular test done on Friday but we have no reports yet. 02/17/2015 -- notes were reviewed from the vascular office and the patient had a venous ultrasound done which revealed that he had no reflux in the greater saphenous vein or the short saphenous vein bilaterally. He did have subacute DVT in the common femoral vein and popliteal veins on the right and left side. The recommendation was to continue with Unna's boot therapy at the wound clinic and then to wear graduated compression stockings once the ulcers healed and later if he had continuous problems lymphedema pump would benefit him. 03/17/2015 -- we have applied for his insurance and aide regarding cellular tissue-based products and are still awaiting the final clearance. 03/24/2015 -- he has had Apligraf authorized for him but his wound is looking so good today that we may not use it. 03/31/2015 -- he has not yet received his compression stockings though we have called a couple of times and hopefully they should arrive this week. [name] ([id number]) [name] 01/06/16; this is a patient we have previously cared for in this clinic with wounds on his right medial ankle. I was not previously involved with his care. 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 He wears  compression stockings. He had reflux studies done during his last stay in this clinic which did not show significant reflux in the greater or lesser saphenous veins bilaterally. His history is that he developed a open sore on the left medial malleolus one week ago. He was seen in his primary physician office and given a course of doxycycline which he still should be on. Previously seen vascular surgery who felt that he had some degree of lymphedema as well. He is not a diabetic 01/13/16 no major change 01/20/16; very small wound on the medial right ankle again covered with surface slough that doesn't seem to be spotting the Prisma 01/27/16; patient comes in today complaining of a lot of [name] around the wound site. He has not been systemically unwell. 02/03/16; the patient's wound culture last week grew Proteus, I had empirically given doxycycline. The Proteus was not specifically plated against doxycycline however Proteus itself was fairly pansensitive and the patient comes back feeling a lot better today. I think the doxycycline was likely to be successful in sufficient 02/10/16; as predicted last week the area has closed over. These are probably venous insufficiency wounds although his previous reflux studies did not show superficial reflux. He also has a history of DVT and at one time had a Greenfield filter in place. The area in question on his left medial ankle region. It became secondarily infected but responded nicely to antibiotics. He is closed today 02/17/16 unfortunately patient's venous wound on the medial aspect of his right ankle at this point in time has reopened. He has been using some compression hose which appear to be very light that he purchased he tells me out of a magazine. He seems a little frustrated with the fact that this has reopened and is concerned about his left lower extremity possibly reopening as well. 02/25/16 patient presents today for follow-up evaluation regarding  his right ankle wound. Currently he shows no interval signs or symptoms of infection. We have been  compression wrapping him unfortunately the wraps that we had on him last week and he has a significant amount of swelling above whether this had slipped down to. He also notes that he's been having some burning as well at the wound site. He rates his discomfort at this point in time to be a 2-3 out of 10. Otherwise he has no other worsening symptoms. 03/03/16; this is a patient that had a wound on his left medial ankle that I discharged on 02/10/16. He apparently reappeared the next week with open areas on his right medial ankle. Her intake nurse reports today that he has a lot of drainage and odor at intake even after the wound was cleaned. Also of note the patient complains of edema in the left leg and showed up with only one of the 2 layer compression system. 03/05/2016 -- since his visit 2 days ago to see Dr. Dellia Nims he complained of significant [name] in his right lower extremity which was much more than he's ever had before. He came in for an urgent visit to review his condition. He has been placed on doxycycline empirically and his culture reports were reviewed but the final result is not back. 03/10/16; patient was in last week to see Dr. Con Memos with increasing [name] in his leg. He was reduced to a 3 layer compression from 4 which seems to have helped overall. Culture from last week grew again pansensitive Proteus, this should've been sensitive to the doxycycline I gave him and he is finishing that today. The patient is had previous arterial and venous review by vascular surgery. Patient is currently using Aquacel Ag under a 3 layer compression. 03/17/16; patient's wound dimensions are down this week. He has been using silver alginate 03/24/2016 - Mark. Lasala arrives today for management of RLE venous ulcer. The alginate dressing is Klammer, Harlon ([id number]) densly adhered to the ulcer.  He offers no complaints, concerns, or needs. 03/31/16; no real change in the wound measurements post debridement. Using Prisma. If anything the measurements are larger today at 2 x 1 cm post debridement Electronic Signature(s) Signed: 03/31/2016 4:57:31 PM By: Mark Ham MD Entered By: [name] on 03/31/2016 08:48:02 Creelman, [name] ([id number]) -------------------------------------------------------------------------------- Physical Exam Details [place], Date of [name]: 03/31/2016 8:15 AM Patient Name: [name] Patient Account Number: [id number] Medical Record Treating [name]: Baruch Gouty [name], [name], Velva Harman [id number] Number: Other Clinician: Date of Birth/Sex: 12/10/1950 (65 y.o. Male) Treating [name] Primary Care Physician/Extender: [name] Physician: Referring Physician: Doristine [name] in Treatment: 12 Constitutional Sitting or standing Blood Pressure is within target range for patient.. Pulse regular and within target range for patient.[name] Respirations regular, non-labored and within target range.. Temperature is normal and within the target range for the patient.. Patient's appearance is neat and clean. Appears in no acute distress. Well nourished and well developed.[name] Respiratory Respiratory effort is easy and symmetric bilaterally. Rate is normal at rest and on room air.. Bilateral breath sounds are clear and equal in all lobes with no wheezes, rales or rhonchi.. Cardiovascular Pedal pulses palpable and strong bilaterally.. Notes Wound exam; again densely adherent dressing, mixed with surface slough subcutaneous tissue removed from the wound with a curette. Post debridement the base of this looks fairly healthy although dimensions are mentoring 2 x 1 cm which is larger than the last measurement Electronic Signature(s) Signed: 03/31/2016 4:57:31 PM By: Mark Ham MD Entered By: [name] on 03/31/2016 08:47:23 Lafoy, [name]  ([id number]) -------------------------------------------------------------------------------- Physician Orders Details [place], Date of [name]:  03/31/2016 8:15 AM Patient Name: KUNTA Patient Account Number: [id number] Medical Record Treating [name]: Baruch Gouty, [name], [name], Velva Harman [id number] Number: Other Clinician: Date of Birth/Sex: 1950/09/05 (65 y.o. 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 o Cleanse wound with mild soap and water - at clinic Anesthetic Wound #3 Right Lower Leg o Topical Lidocaine 4% cream applied to wound bed prior to debridement Skin Barriers/Peri-Wound Care Wound #3 Right Lower Leg o Barrier cream Primary Wound Dressing Wound #3 Right Lower Leg o Hydrafera Blue Secondary Dressing Wound #3 Right Lower Leg o ABD pad o Dry Gauze Dressing Change Frequency Wound #3 Right Lower Leg o Change dressing every week Dipasquale, Avante ([id number]) Follow-up Appointments Wound #3 Right Lower Leg o Return Appointment in 1 week. Edema Control Wound #3 Right Lower Leg o 3 Layer Compression System - Right Lower Extremity - unna to anchor Additional Orders / Instructions Wound #3 Right Lower Leg o Increase protein intake. Electronic Signature(s) Signed: 03/31/2016 4:57:31 PM By: Mark Ham MD Signed: 03/31/2016 5:16:37 PM By: [name] Entered By: [name] on 03/31/2016 08:45:19 Bagshaw, [name]  ([id number]) -------------------------------------------------------------------------------- Problem List Details [place], Date of [name]: 03/31/2016 8:15 AM Patient Name: [facility] Patient Account Number: [id number] Medical Record Treating [name]: Baruch Gouty [name], [name], Velva Harman [id number] Number: Other Clinician: Date of Birth/Sex: 07-30-1950 (65 y.o. Male) Treating [name] Primary Care Physician/Extender: [name] Physician: Referring Physician: Doristine [name] in Treatment: 12 Active Problems ICD-10 Encounter Code Description Active Date Diagnosis L97.311 Non-pressure chronic ulcer of right ankle limited to 03/03/2016 Yes breakdown of skin I87.331 Chronic venous hypertension (idiopathic) with ulcer and 03/03/2016 Yes inflammation of right lower extremity Inactive Problems Resolved Problems ICD-10 Code Description Active Date Resolved Date L97.321 Non-pressure chronic ulcer of left ankle limited to 01/06/2016 01/06/2016 breakdown of skin I87.332 Chronic venous hypertension (idiopathic) with ulcer and 01/06/2016 01/06/2016 inflammation of left lower extremity Electronic Signature(s) Signed: 03/31/2016 4:57:31 PM By: Mark Ham MD Entered By: [name] on 03/31/2016 08:44:18 Danielski, [name] ([id number]) -------------------------------------------------------------------------------- Progress Note Details [place], Date of [name]: 03/31/2016 8:15 AM Patient Name: [name] Patient Account Number: [id number] Medical Record Treating [name]: Baruch Gouty, [name], [name], Velva Harman [id number] Number: Other Clinician: Date of Birth/Sex: 05-31-1950 (65 y.o. Male) Treating [name] Primary Care Physician/Extender: [name] Physician: Referring Physician: Doristine [name] in Treatment: 12 Subjective Chief Complaint Information obtained from Patient Mark Advani presents today for routine management on his RLE venous ulcer History of Present Illness (HPI) The  following HPI elements were documented for the patient's wound: Location: open wound just above his right ankle medially Quality: Patient tells me he is not having a significant amount of [name] at this point in time. Severity: 1 out of 10 Duration: This just reopened in the past week. Timing: [name] in wound is Intermittent Context: The wound appeared gradually over time Modifying Factors: Consults to this date include: he was seen in the ER and was referred to a vascular surgeon but the patient has not done that. He may have been treated with clindamycin in the ER. Associated Signs and Symptoms: Patient  reports having difficulty standing for long periods. 65 year old gentleman who was seen in the emergency department recently on 01/06/2015 for a wound of his right lower extremity which he says was not involving any injury and he did not know how he sustained it. He had draining foul-smelling liquid from the area and had gone for care there. his past medical history is significant for DVT, hypertension, gout, tobacco abuse, cocaine abuse, stroke, atrial fibrillation, pulmonary embolism. he has also had some vascular surgery with a stent placed in his leg. He has been a smoker for many years and has given up straight drugs several years ago. He continues to smoke about 4-5 cigarettes a day. 02/03/2015 -- received a note from 05/14/2013 where Dr. Leotis [name] placed an inferior vena cava filter. The patient had a deep vein thrombosis while therapeutic on anticoagulation for previous DVT and a IVC filter was placed for this. 02/10/2015 -- he did have his vascular test done on Friday but we have no reports yet. 02/17/2015 -- notes were reviewed from the vascular office and the patient had a venous ultrasound done which revealed that he had no reflux in the greater saphenous vein or the short saphenous vein bilaterally. He did have subacute DVT in the common femoral vein and popliteal veins on the right  and left side. The recommendation was to continue with Unna's boot therapy at the wound clinic and then to wear graduated compression stockings once the ulcers healed and later if he had continuous problems lymphedema pump would benefit him. EKIN, PILAR ([id number]) 03/17/2015 -- we have applied for his insurance and aide regarding cellular tissue-based products and are still awaiting the final clearance. 03/24/2015 -- he has had Apligraf authorized for him but his wound is looking so good today that we may not use it. 03/31/2015 -- he has not yet received his compression stockings though we have called a couple of times and hopefully they should arrive this week. READMISSION 01/06/16; this is a patient we have previously cared for in this clinic with wounds on his right medial ankle. I was not previously involved with his care. 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 He wears compression stockings. He had reflux studies done during his last stay in this clinic which did not show significant reflux in the greater or lesser saphenous veins bilaterally. His history is that he developed a open sore on the left medial malleolus one week ago. He was seen in his primary physician office and given a course of doxycycline which he still should be on. Previously seen vascular surgery who felt that he had some degree of lymphedema as well. He is not a diabetic 01/13/16 no major change 01/20/16; very small wound on the medial right ankle again covered with surface slough that doesn't seem to be spotting the Prisma 01/27/16; patient comes in today complaining of a lot of [name] around the wound site. He has not been systemically unwell. 02/03/16; the patient's wound culture last week grew Proteus, I had empirically given doxycycline. The Proteus was not specifically plated against doxycycline however  Proteus itself was fairly pansensitive and the patient comes back feeling a lot better today. I think the doxycycline was likely to be successful in sufficient 02/10/16; as predicted last week the area has closed over. These are probably venous insufficiency wounds although his previous reflux studies did  not show superficial reflux. He also has a history of DVT and at one time had a Greenfield filter in place. The area in question on his left medial ankle region. It became secondarily infected but responded nicely to antibiotics. He is closed today 02/17/16 unfortunately patient's venous wound on the medial aspect of his right ankle at this point in time has reopened. He has been using some compression hose which appear to be very light that he purchased he tells me out of a magazine. He seems a little frustrated with the fact that this has reopened and is concerned about his left lower extremity possibly reopening as well. 02/25/16 patient presents today for follow-up evaluation regarding his right ankle wound. Currently he shows no interval signs or symptoms of infection. We have been compression wrapping him unfortunately the wraps that we had on him last week and he has a significant amount of swelling above whether this had slipped down to. He also notes that he's been having some burning as well at the wound site. He rates his discomfort at this point in time to be a 2-3 out of 10. Otherwise he has no other worsening symptoms. 03/03/16; this is a patient that had a wound on his left medial ankle that I discharged on 02/10/16. He apparently reappeared the next week with open areas on his right medial ankle. Her intake nurse reports today that he has a lot of drainage and odor at intake even after the wound was cleaned. Also of note the patient complains of edema in the left leg and showed up with only one of the 2 layer compression system. 03/05/2016 -- since his visit 2 days ago to see Dr.  Dellia Nims he complained of significant [name] in his right lower extremity which was much more than he's ever had before. He came in for an urgent visit to review his condition. He has been placed on doxycycline empirically and his culture reports were reviewed but the final result is not back. 03/10/16; patient was in last week to see Dr. Con Memos with increasing [name] in his leg. He was reduced to a 3 Waldman, Kennis ([id number]) layer compression from 4 which seems to have helped overall. Culture from last week grew again pansensitive Proteus, this should've been sensitive to the doxycycline I gave him and he is finishing that today. The patient is had previous arterial and venous review by vascular surgery. Patient is currently using Aquacel Ag under a 3 layer compression. 03/17/16; patient's wound dimensions are down this week. He has been using silver alginate 03/24/2016 - Mark. Rossmann arrives today for management of RLE venous ulcer. The alginate dressing is densly adhered to the ulcer. He offers no complaints, concerns, or needs. 03/31/16; no real change in the wound measurements post debridement. Using Prisma. If anything the measurements are larger today at 2 x 1 cm post debridement Objective Constitutional Sitting or standing Blood Pressure is within target range for patient.. Pulse regular and within target range for patient.[name] Respirations regular, non-labored and within target range.. Temperature is normal and within the target range for the patient.. Patient's appearance is neat and clean. Appears in no acute distress. Well nourished and well developed.. Vitals Time Taken: 8:23 AM, Height: 74 in, Weight: 240 lbs, BMI: 30.8, Temperature: 98.3 F, Pulse: 61 bpm, Respiratory Rate: 19 breaths/min, Blood Pressure: 185/95 mmHg. General Notes: Dr. Dellia Nims made aware of BP. Patient said he already taken his BP med this morning Respiratory Respiratory effort is easy and  symmetric  bilaterally. Rate is normal at rest and on room air.. Bilateral breath sounds are clear and equal in all lobes with no wheezes, rales or rhonchi.. Cardiovascular Pedal pulses palpable and strong bilaterally.. General Notes: Wound exam; again densely adherent dressing, mixed with surface slough subcutaneous tissue removed from the wound with a curette. Post debridement the base of this looks fairly healthy although dimensions are mentoring 2 x 1 cm which is larger than the last measurement Integumentary (Hair, Skin) Wound #3 status is Open. Original cause of wound was Gradually Appeared. The wound is located on the Right Lower Leg. The wound measures 2cm length x 1cm width x 0.2cm depth; 1.571cm^2 area and 0.314cm^3 volume. The wound is limited to skin breakdown. There is no tunneling or undermining noted. There is a medium amount of serosanguineous drainage noted. The wound margin is flat and intact. There is medium (34-66%) red, pink, pale granulation within the wound bed. There is a small (1-33%) amount of necrotic tissue within the wound bed including Adherent Slough. The periwound skin appearance exhibited: Induration, Scarring, Dry/Scaly, Moist, Hemosiderin Staining, Mottled. The periwound skin appearance did not exhibit: Callus, Crepitus, Excoriation, Fluctuance, Friable, Localized Edema, Rash, Maceration, Atrophie Mcwhirter, Keyron ([id number]) Blanche, Cyanosis, Ecchymosis, Pallor, Rubor, Erythema. Periwound temperature was noted as No Abnormality. The periwound has tenderness on palpation. 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 There was a Skin/Subcutaneous Tissue Debridement ([id number]) debridement with total area of  2 sq cm performed by Mark Dillon, MD. with the following instrument(s): Curette to remove Non-Viable tissue/material including Fibrin/Slough and Subcutaneous after achieving [name] control using Lidocaine 4% Topical Solution. A time out was conducted at 08:40, prior to the start of the procedure. A Minimum amount of bleeding was controlled with Pressure. The procedure was tolerated well with a [name] level of 0 throughout and a [name] level of 0 following the procedure. Post Debridement Measurements: 2cm length x 1cm width x 0.1cm depth; 0.157cm^3 volume. Character of Wound/Ulcer Post Debridement is stable. Severity of Tissue Post Debridement is: Fat layer exposed. Post procedure Diagnosis Wound #3: Same as Pre-Procedure Plan Wound Cleansing: Wound #3 Right Lower Leg: Clean wound with Normal Saline. Cleanse wound with mild soap and water - at clinic Anesthetic: Wound #3 Right Lower Leg: Topical Lidocaine 4% cream applied to wound bed prior to debridement Skin Barriers/Peri-Wound Care: MANAS, HICKLING ([id number]) Wound #3 Right Lower Leg: Barrier cream Primary Wound Dressing: Wound #3 Right Lower Leg: Hydrafera Blue Secondary Dressing: Wound #3 Right Lower Leg: ABD pad Dry Gauze Dressing Change Frequency: Wound #3 Right Lower Leg: Change dressing every week Follow-up Appointments: Wound #3 Right Lower Leg: Return Appointment in 1 week. Edema Control: Wound #3 Right Lower Leg: 3 Layer Compression System - Right Lower Extremity - unna to anchor Additional Orders / Instructions: Wound #3 Right Lower Leg: Increase protein intake. chnaged primary dressing to hydrofera blue.Clearly a venous insufficiency ulcer that appears stalled Electronic Signature(s) Signed: 03/31/2016 4:57:31 PM By: Mark Ham MD Entered By: [name] on 03/31/2016 08:49:53 Cominsky, [name]  ([id number]) -------------------------------------------------------------------------------- SuperBill Details Duckett, Date of [name]: 03/31/2016 Patient Name: [name] Patient Account Number: [id number] Medical Record Treating [name]: Baruch Gouty [name], [name], Velva Harman [id number] Number: Other Clinician: Date of Birth/Sex: [birth year] (65 y.o. Male) Treating  [name] Primary Care Physician/Extender: [name] Physician: Suella Grove in Treatment: 12 Referring Physician: Royetta Crochet Diagnosis Coding ICD-10 Codes Code Description F29.244 Non-pressure chronic ulcer of right ankle limited to breakdown of skin Chronic venous hypertension (idiopathic) with ulcer and inflammation of right lower I87.331 extremity Facility Procedures CPT4 Code Description: [id number] 11042 - DEB SUBQ TISSUE 20 SQ CM/< ICD-10 Description Diagnosis L97.311 Non-pressure chronic ulcer of right ankle limited t Modifier: o breakdown o Quantity: 1 f skin Physician Procedures CPT4 Code Description: [id number] 11042 - WC PHYS SUBQ TISS 20 SQ CM ICD-10 Description Diagnosis U38.333 Non-pressure chronic ulcer of right ankle limited t Modifier: o breakdown o Quantity: 1 f skin Electronic Signature(s) Signed: 03/31/2016 4:57:31 PM By: Mark Ham MD Entered By: [name] on 03/31/2016 08:50:30

## 2016-04-07 ENCOUNTER — Encounter: Payer: Medicare Other | Admitting: Nurse Practitioner

## 2016-04-07 DIAGNOSIS — I87331 Chronic venous hypertension (idiopathic) with ulcer and inflammation of right lower extremity: Secondary | ICD-10-CM | POA: Diagnosis not present

## 2016-04-13 NOTE — Progress Notes (Signed)
CARLEE, TESFAYE ([id number]) Visit Report for 04/07/2016 Chief Complaint Document Details Mellette, 04/07/2016 8:15 Patient Name: Date of Service: [facility] AM Medical Record Patient Account Number: [id number] Number: Treating RN: Baruch Gouty, RN, [name], Velva Harman Date of Birth/Sex: 04/18/1951 (65 y.o. Male) Other Clinician: Primary Care Physician: Si Gaul, [place] Referring Physician: Royetta Crochet Physician/Extender: Weeks in Treatment: 13 Information Obtained from: Patient Chief Complaint Mr Frediani presents today for routine management on his RLE venous ulcer Electronic Signature(s) Signed: 04/07/2016 8:55:07 AM By: Rene Kocher, NP, Omolola Mittman Entered By: Rene Kocher, NP, Donie Moulton on 04/07/2016 08:55:07 Helfman, [name] ([id number]) -------------------------------------------------------------------------------- HPI Details Samples, 04/07/2016 8:15 Patient Name: Date of Service: [name] AM Medical Record Patient Account Number: [id number] Number: Treating RN: Baruch Gouty RN, [name], Velva Harman Date of Birth/Sex: 03/31/1951 (65 y.o. Male) Other Clinician: Primary Care Physician: Si Gaul, [place] Referring Physician: Royetta Crochet Physician/Extender: Weeks in Treatment: 13 History of Present Illness Location: open wound just above his right ankle medially Quality: Patient tells me he is not having a significant amount of pain at this point in time. Severity: 1 out of 10 Duration: This just reopened in the past week. Timing: Pain in wound is Intermittent Context: The wound appeared gradually over time Modifying Factors: Consults to this date include: he was seen in the ER and was referred to a vascular surgeon but the patient has not done that. He may have been treated with clindamycin in the ER. Associated Signs and Symptoms: Patient reports having difficulty standing for long periods. HPI Description: 65 year old gentleman who was seen in  the emergency department recently on 01/06/2015 for a wound of his right lower extremity which he says was not involving any injury and he did not know how he sustained it. He had draining foul-smelling liquid from the area and had gone for care there. his past medical history is significant for DVT, hypertension, gout, tobacco abuse, cocaine abuse, stroke, atrial fibrillation, pulmonary embolism. he has also had some vascular surgery with a stent placed in his leg. He has been a smoker for many years and has given up straight drugs several years ago. He continues to smoke about 4-5 cigarettes a day. 02/03/2015 -- received a note from 05/14/2013 where Dr. Leotis Pain placed an inferior vena cava filter. The patient had a deep vein thrombosis while therapeutic on anticoagulation for previous DVT and a IVC filter was placed for this. 02/10/2015 -- he did have his vascular test done on Friday but we have no reports yet. 02/17/2015 -- notes were reviewed from the vascular office and the patient had a venous ultrasound done which revealed that he had no reflux in the greater saphenous vein or the short saphenous vein bilaterally. He did have subacute DVT in the common femoral vein and popliteal veins on the right and left side. The recommendation was to continue with Unna's boot therapy at the wound clinic and then to wear graduated compression stockings once the ulcers healed and later if he had continuous problems lymphedema pump would benefit him. 03/17/2015 -- we have applied for his insurance and aide regarding cellular tissue-based products and are still awaiting the final clearance. 03/24/2015 -- he has had Apligraf authorized for him but his wound is looking so good today that we may not use it. 03/31/2015 -- he has not yet received his compression stockings though we have called a couple of times and hopefully they should arrive this week. NAPHTALI, ZYWICKI  ([id number]) 01/06/16; this is a patient we have previously  cared for in this clinic with wounds on his right medial ankle. I was not previously involved with his care. 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 He wears compression stockings. He had reflux studies done during his last stay in this clinic which did not show significant reflux in the greater or lesser saphenous veins bilaterally. His history is that he developed a open sore on the left medial malleolus one week ago. He was seen in his primary physician office and given a course of doxycycline which he still should be on. Previously seen vascular surgery who felt that he had some degree of lymphedema as well. He is not a diabetic 01/13/16 no major change 01/20/16; very small wound on the medial right ankle again covered with surface slough that doesn't seem to be spotting the Prisma 01/27/16; patient comes in today complaining of a lot of pain around the wound site. He has not been systemically unwell. 02/03/16; the patient's wound culture last week grew Proteus, I had empirically given doxycycline. The Proteus was not specifically plated against doxycycline however Proteus itself was fairly pansensitive and the patient comes back feeling a lot better today. I think the doxycycline was likely to be successful in sufficient 02/10/16; as predicted last week the area has closed over. These are probably venous insufficiency wounds although his previous reflux studies did not show superficial reflux. He also has a history of DVT and at one time had a Greenfield filter in place. The area in question on his left medial ankle region. It became secondarily infected but responded nicely to antibiotics. He is closed today 02/17/16 unfortunately patient's venous wound on the medial aspect of his right ankle at this point in time has reopened. He has been  using some compression hose which appear to be very light that he purchased he tells me out of a magazine. He seems a little frustrated with the fact that this has reopened and is concerned about his left lower extremity possibly reopening as well. 02/25/16 patient presents today for follow-up evaluation regarding his right ankle wound. Currently he shows no interval signs or symptoms of infection. We have been compression wrapping him unfortunately the wraps that we had on him last week and he has a significant amount of swelling above whether this had slipped down to. He also notes that he's been having some burning as well at the wound site. He rates his discomfort at this point in time to be a 2-3 out of 10. Otherwise he has no other worsening symptoms. 03/03/16; this is a patient that had a wound on his left medial ankle that I discharged on 02/10/16. He apparently reappeared the next week with open areas on his right medial ankle. Her intake nurse reports today that he has a lot of drainage and odor at intake even after the wound was cleaned. Also of note the patient complains of edema in the left leg and showed up with only one of the 2 layer compression system. 03/05/2016 -- since his visit 2 days ago to see Dr. Dellia Nims he complained of significant pain in his right lower extremity which was much more than he's ever had before. He came in for an urgent visit to review his condition. He has been placed on doxycycline empirically and his culture reports were reviewed but the final result is not back. 03/10/16; patient  was in last week to see Dr. Con Memos with increasing pain in his leg. He was reduced to a 3 layer compression from 4 which seems to have helped overall. Culture from last week grew again pansensitive Proteus, this should've been sensitive to the doxycycline I gave him and he is finishing that today. The patient is had previous arterial and venous review by vascular surgery. Patient  is currently using Aquacel Ag under a 3 layer compression. 03/17/16; patient's wound dimensions are down this week. He has been using silver alginate 03/24/2016 - Mr. Demarinis arrives today for management of RLE venous ulcer. The alginate dressing is densly adhered to the ulcer. He offers no complaints, concerns, or needs. JUSHUA, WALTMAN ([id number]) 03/31/16; no real change in the wound measurements post debridement. Using Prisma. If anything the measurements are larger today at 2 x 1 cm post debridement 04-07-16 Mr. Tomei arrives today for management of his right lower extremity venous ulcer. He is voicing no complaints associated with his wound over the last week. He does inquire about need for compression therapy, this appears to be a weekly inquiry. He was advised that compression therapy is indicated throughout the treatment of the wound and he will then transition to compression stockings. He is compliant with compression stockings to the left lower extremity. Electronic Signature(s) Signed: 04/07/2016 8:56:28 AM By: Rene Kocher, NP, Jana Hakim By: Rene Kocher, NP, Drue Harr on 04/07/2016 08:56:27 Ebbs, [name] ([id number]) -------------------------------------------------------------------------------- Physical Exam Details Cragun, 04/07/2016 8:15 Patient Name: Date of Service: [facility] AM Medical Record Patient Account Number: [id number] Number: Treating RN: Baruch Gouty RN, [name], Velva Harman Date of Birth/Sex: 10-19-1950 (65 y.o. Male) Other Clinician: Primary Care Physician: Si Gaul, Arohi Salvatierra Referring Physician: Royetta Crochet Physician/Extender: Weeks in Treatment: 13 Constitutional BP within normal limits. afebrile. well nourished; well developed; appears stated age;[name] Respiratory non-labored respiratory effort. Cardiovascular palpable DP, non-palpable PT. varicose veins, discoloration present in the right lower extremity. Musculoskeletal ambulated  without assistance; steady gait. Integumentary (Hair, Skin) no periwound erythema, no tissue necrosis. no induration, no fluctuance, denies pain. Psychiatric does not appear to fully comprehend disease process. oriented to time, place, person and situation. calm, pleasant, conversive. Electronic Signature(s) Signed: 04/07/2016 8:57:34 AM By: Rene Kocher, NP, Jana Hakim By: Rene Kocher, NP, Abijah Roussel on 04/07/2016 08:57:34 Cubbage, [name] ([id number]) -------------------------------------------------------------------------------- Physician Orders Details Danielski, 04/07/2016 8:15 Patient Name: Date of Service: [facility] AM Medical Record Patient Account Number: [id number] Number: Treating RN: Baruch Gouty RN, [name], Velva Harman Date of Birth/Sex: Jul 14, 1950 (65 y.o. Male) Other Clinician: Primary Care Physician: Si Gaul, Ahriyah Vannest Referring Physician: Royetta Crochet Physician/Extender: Weeks in Treatment: 13 Verbal / Phone Orders: Yes Clinician: Afful, RN, [name], Rita Read Back and Verified: Yes Diagnosis Coding Wound Cleansing Wound #3 Right Lower Leg o Clean wound with Normal Saline. o Cleanse wound with mild soap and water - at clinic Anesthetic Wound #3 Right Lower Leg o Topical Lidocaine 4% cream applied to wound bed prior to debridement Skin Barriers/Peri-Wound Care Wound #3 Right Lower Leg o Barrier cream Primary Wound Dressing Wound #3 Right Lower Leg o Hydrafera Blue Secondary Dressing Wound #3 Right Lower Leg o ABD pad o Dry Gauze Dressing Change Frequency Wound #3 Right Lower Leg o Change dressing every week Follow-up Appointments Wound #3 Right Lower Leg o Return Appointment in 1 week. Edema Control Wound #3 Right Lower Leg o 3 Layer Compression System - Right Lower Extremity - unna to anchor Sanger, Tracey ([id number]) Additional Orders / Instructions Wound #3 Right Lower Leg o Increase  protein intake. Electronic  Signature(s) Signed: 04/07/2016 3:30:24 PM By: [name] [name], RN Signed: 04/13/2016 9:26:16 AM By: Rene Kocher, NP, Reeve Turnley Entered By: [name] on 04/07/2016 08:44:33 Magaw, [name] ([id number]) -------------------------------------------------------------------------------- Problem List Details Kuehnel, 04/07/2016 8:15 Patient Name: Date of Service: [facility] AM Medical Record Patient Account Number: [id number] Number: Treating RN: Baruch Gouty RN, [name], Velva Harman Date of Birth/Sex: 06-08-1950 (65 y.o. 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 to 03/03/2016 Yes breakdown of skin I87.331 Chronic venous hypertension (idiopathic) with ulcer and 03/03/2016 Yes inflammation of right lower extremity Inactive Problems Resolved Problems ICD-10 Code Description Active Date Resolved Date L97.321 Non-pressure chronic ulcer of left ankle limited to 01/06/2016 01/06/2016 breakdown of skin I87.332 Chronic venous hypertension (idiopathic) with ulcer and 01/06/2016 01/06/2016 inflammation of left lower extremity Electronic Signature(s) Signed: 04/07/2016 8:54:56 AM By: Rene Kocher, NP, Lauralee Waters Entered By: Rene Kocher, NP, Garland Hincapie on 04/07/2016 08:54:56 Damiani, [name] ([id number]) -------------------------------------------------------------------------------- Progress Note Details Kirst, 04/07/2016 8:15 Patient Name: Date of Service: [name] AM Medical Record Patient Account Number: [id number] Number: Treating RN: Baruch Gouty, RN, [name], Velva Harman Date of Birth/Sex: 16-Apr-1951 (65 y.o. Male) Other Clinician: Primary Care Physician: Si Gaul, [place] Referring Physician: Royetta Crochet Physician/Extender: Weeks in  Treatment: 13 Subjective Chief Complaint Information obtained from Patient Mr Iorio presents today for routine management on his RLE venous ulcer History of Present Illness (HPI) The following HPI elements were documented for the patient's wound: Location: open wound just above his right ankle medially Quality: Patient tells me he is not having a significant amount of pain at this point in time. Severity: 1 out of 10 Duration: This just reopened in the past week. Timing: Pain in wound is Intermittent Context: The wound appeared gradually over time Modifying Factors: Consults to this date include: he was seen in the ER and was referred to a vascular surgeon but the patient has not done that. He may have been treated with clindamycin in the ER. Associated Signs and Symptoms: Patient reports having difficulty standing for long periods. 65 year old gentleman who was seen in the emergency department recently on 01/06/2015 for a wound of his right lower extremity which he says was not involving any injury and he did not know how he sustained it. He had draining foul-smelling liquid from the area and had gone for care there. his past medical history is significant for DVT, hypertension, gout, tobacco abuse, cocaine abuse, stroke, atrial fibrillation, pulmonary embolism. he has also had some vascular surgery with a stent placed in his leg. He has been a smoker for many years and has given up straight drugs several years ago. He continues to smoke about 4-5 cigarettes a day. 02/03/2015 -- received a note from 05/14/2013 where Dr. Leotis Pain placed an inferior vena cava filter. The patient had a deep vein thrombosis while therapeutic on anticoagulation for previous DVT and a IVC filter was placed for this. 02/10/2015 -- he did have his vascular test done on Friday but we have no reports yet. 02/17/2015 -- notes were reviewed from the vascular office and the patient had a venous ultrasound  done which revealed that he had no reflux in the greater saphenous vein or the short saphenous vein bilaterally. He did have subacute DVT in the common femoral vein and popliteal veins on the right and left side. The recommendation was  to continue with Unna's boot therapy at the wound clinic and then to wear graduated compression stockings once the ulcers healed and later if he had continuous problems lymphedema pump would benefit him. 03/17/2015 -- we have applied for his insurance and aide regarding cellular tissue-based products and are Kina, [place] ([id number]) still awaiting the final clearance. 03/24/2015 -- he has had Apligraf authorized for him but his wound is looking so good today that we may not use it. 03/31/2015 -- he has not yet received his compression stockings though we have called a couple of times and hopefully they should arrive this week. READMISSION 01/06/16; this is a patient we have previously cared for in this clinic with wounds on his right medial ankle. I was not previously involved with his care. 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 He wears compression stockings. He had reflux studies done during his last stay in this clinic which did not show significant reflux in the greater or lesser saphenous veins bilaterally. His history is that he developed a open sore on the left medial malleolus one week ago. He was seen in his primary physician office and given a course of doxycycline which he still should be on. Previously seen vascular surgery who felt that he had some degree of lymphedema as well. He is not a diabetic 01/13/16 no major change 01/20/16; very small wound on the medial right ankle again covered with surface slough that doesn't seem to be spotting the Prisma 01/27/16; patient comes in today complaining of a lot of pain around the wound site. He has not  been systemically unwell. 02/03/16; the patient's wound culture last week grew Proteus, I had empirically given doxycycline. The Proteus was not specifically plated against doxycycline however Proteus itself was fairly pansensitive and the patient comes back feeling a lot better today. I think the doxycycline was likely to be successful in sufficient 02/10/16; as predicted last week the area has closed over. These are probably venous insufficiency wounds although his previous reflux studies did not show superficial reflux. He also has a history of DVT and at one time had a Greenfield filter in place. The area in question on his left medial ankle region. It became secondarily infected but responded nicely to antibiotics. He is closed today 02/17/16 unfortunately patient's venous wound on the medial aspect of his right ankle at this point in time has reopened. He has been using some compression hose which appear to be very light that he purchased he tells me out of a magazine. He seems a little frustrated with the fact that this has reopened and is concerned about his left lower extremity possibly reopening as well. 02/25/16 patient presents today for follow-up evaluation regarding his right ankle wound. Currently he shows no interval signs or symptoms of infection. We have been compression wrapping him unfortunately the wraps that we had on him last week and he has a significant amount of swelling above whether this had slipped down to. He also notes that he's been having some burning as well at the wound site. He rates his discomfort at this point in time to be a 2-3 out of 10. Otherwise he has no other worsening symptoms. 03/03/16; this is a patient that had a wound on his left medial ankle that I discharged on 02/10/16. He apparently reappeared the next week with open areas on  his right medial ankle. Her intake nurse reports today that he has a lot of drainage and odor at intake even after the  wound was cleaned. Also of note the patient complains of edema in the left leg and showed up with only one of the 2 layer compression system. 03/05/2016 -- since his visit 2 days ago to see Dr. Dellia Nims he complained of significant pain in his right lower extremity which was much more than he's ever had before. He came in for an urgent visit to review his condition. He has been placed on doxycycline empirically and his culture reports were reviewed but the final result is not back. 03/10/16; patient was in last week to see Dr. Con Memos with increasing pain in his leg. He was reduced to a 3 layer compression from 4 which seems to have helped overall. Culture from last week grew again Tabron, [name] ([id number]) pansensitive Proteus, this should've been sensitive to the doxycycline I gave him and he is finishing that today. The patient is had previous arterial and venous review by vascular surgery. Patient is currently using Aquacel Ag under a 3 layer compression. 03/17/16; patient's wound dimensions are down this week. He has been using silver alginate 03/24/2016 - Mr. Mcclaran arrives today for management of RLE venous ulcer. The alginate dressing is densly adhered to the ulcer. He offers no complaints, concerns, or needs. 03/31/16; no real change in the wound measurements post debridement. Using Prisma. If anything the measurements are larger today at 2 x 1 cm post debridement 04-07-16 Mr. Tomei arrives today for management of his right lower extremity venous ulcer. He is voicing no complaints associated with his wound over the last week. He does inquire about need for compression therapy, this appears to be a weekly inquiry. He was advised that compression therapy is indicated throughout the treatment of the wound and he will then transition to compression stockings. He is compliant with compression stockings to the left lower extremity. Objective Constitutional BP within normal limits.  afebrile. well nourished; well developed; appears stated age;[name] Vitals Time Taken: 8:21 AM, Height: 74 in, Weight: 240 lbs, BMI: 30.8, Temperature: 97.4 F, Pulse: 61 bpm, Respiratory Rate: 19 breaths/min, Blood Pressure: 143/96 mmHg. Respiratory non-labored respiratory effort. Cardiovascular palpable DP, non-palpable PT. varicose veins, discoloration present in the right lower extremity. Musculoskeletal ambulated without assistance; steady gait. Psychiatric does not appear to fully comprehend disease process. oriented to time, place, person and situation. calm, pleasant, conversive. Integumentary (Hair, Skin) no periwound erythema, no tissue necrosis. no induration, no fluctuance, denies pain. Wound #3 status is Open. Original cause of wound was Gradually Appeared. The wound is located on the Right Lower Leg. The wound measures 1.5cm length x 0.8cm width x 0.2cm depth; 0.942cm^2 area and Sofranko, Crosby ([id number]) 0.188cm^3 volume. The wound is limited to skin breakdown. There is no tunneling or undermining noted. There is a medium amount of serosanguineous drainage noted. The wound margin is flat and intact. There is medium (34-66%) red, pink, pale granulation within the wound bed. There is no necrotic tissue within the wound bed. The periwound skin appearance exhibited: Induration, Scarring, Moist, Hemosiderin Staining, Mottled. The periwound skin appearance did not exhibit: Callus, Crepitus, Excoriation, Fluctuance, Friable, Localized Edema, Rash, Dry/Scaly, Maceration, Atrophie Blanche, Cyanosis, Ecchymosis, Pallor, Rubor, Erythema. Periwound temperature was noted as No Abnormality. Assessment Active Problems ICD-10 U13.244 - Non-pressure chronic ulcer of right ankle limited to breakdown of skin I87.331 - Chronic venous hypertension (idiopathic) with ulcer and inflammation of right  lower extremity Plan Wound Cleansing: Wound #3 Right Lower Leg: Clean wound with Normal  Saline. Cleanse wound with mild soap and water - at clinic Anesthetic: Wound #3 Right Lower Leg: Topical Lidocaine 4% cream applied to wound bed prior to debridement Skin Barriers/Peri-Wound Care: Wound #3 Right Lower Leg: Barrier cream Primary Wound Dressing: Wound #3 Right Lower Leg: Hydrafera Blue Secondary Dressing: Wound #3 Right Lower Leg: ABD pad Dry Gauze Dressing Change Frequency: Wound #3 Right Lower Leg: Change dressing every week Follow-up Appointments: Wound #3 Right Lower Leg: Return Appointment in 1 week. Edema Control: Nghiem, [name] ([id number]) Wound #3 Right Lower Leg: 3 Layer Compression System - Right Lower Extremity - unna to anchor Additional Orders / Instructions: Wound #3 Right Lower Leg: Increase protein intake. Follow-Up Appointments: A Patient Clinical Summary of Care was provided to WD 1. Will continue with Hydrofera Blue and 3 layer compression 2. Will continue with weekly evaluations Electronic Signature(s) Signed: 04/07/2016 8:57:58 AM By: Rene Kocher, NP, Kameran Lallier Entered By: Rene Kocher, NP, Dewey Neukam on 04/07/2016 08:57:58 Hefner, [name] ([id number]) -------------------------------------------------------------------------------- SuperBill Details Schrier, Date of Service: 04/07/2016 Patient Name: [facility] Patient Account Number: [id number] Medical Record Afful, RN, [name], [id number] Treating RN: Number: Velva Harman Date of Birth/Sex: 1950/11/05 (65 y.o. 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 hypertension (idiopathic) with ulcer and inflammation of right lower I87.331 extremity Facility Procedures CPT4: Description Modifier Quantity Code [phone] (Facility Use Only) [phone] - APPLY  [place] RT 1 LEG Physician Procedures CPT4: Description Modifier Quantity Code [id number] - WC PHYS LEVEL 3 - EST PT 1 ICD-10 Description Diagnosis L97.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

## 2016-04-14 ENCOUNTER — Encounter: Payer: Medicare Other | Admitting: Internal Medicine

## 2016-04-14 DIAGNOSIS — I87331 Chronic venous hypertension (idiopathic) with ulcer and inflammation of right lower extremity: Secondary | ICD-10-CM | POA: Diagnosis not present

## 2016-04-15 NOTE — Progress Notes (Signed)
[name] ([id number]) Visit Report for 04/14/2016 Chief Complaint Document Details [place], Date of Service: 04/14/2016 8:15 AM Patient Name: [name] Patient Account Number: [id number] Medical Record Treating RN: Baruch Gouty, RN, [name], Velva Harman [id number] Number: Other Clinician: Date of Birth/Sex: [birth year] (65 y.o. Male) Treating Dellia Nims, Cierrah Dace Primary Care Physician/Extender: Tonye Pearson Physician: Referring Physician: Doristine Locks in Treatment: 14 Information Obtained from: Patient Chief Complaint [name] presents today for routine management on his RLE venous ulcer Electronic Signature(s) Signed: 04/14/2016 4:55:30 PM By: [name] MD Entered By: [name] on 04/14/2016 09:28:33 [name], [name] ([id number]) -------------------------------------------------------------------------------- HPI Details [name], Date of Service: 04/14/2016 8:15 AM Patient Name: [name] Patient Account Number: [id number] Medical Record Treating RN: Baruch Gouty RN, [name], Velva Harman [id number] Number: Other Clinician: Date of Birth/Sex: 1950/07/23 (65 y.o. Male) Treating [name] Primary Care Physician/Extender: Tonye Pearson Physician: Referring Physician: Doristine Locks in Treatment: 14 History of Present Illness Location: open wound just above his right ankle medially Quality: Patient tells me he is not having a significant amount of pain at this point in time. Severity: 1 out of 10 Duration: This just reopened in the past week. Timing: Pain in wound is Intermittent Context: The wound appeared gradually over time Modifying Factors: Consults to this date include: he was seen in the ER and was referred to a vascular surgeon but the patient has not done that. He may have been treated with clindamycin in the ER. Associated Signs and Symptoms: Patient reports having difficulty standing for long periods. HPI Description: 65 year old gentleman who was seen  in the emergency department recently on 01/06/2015 for a wound of his right lower extremity which he says was not involving any injury and he did not know how he sustained it. He had draining foul-smelling liquid from the area and had gone for care there. his past medical history is significant for DVT, hypertension, gout, tobacco abuse, cocaine abuse, stroke, atrial fibrillation, pulmonary embolism. he has also had some vascular surgery with a stent placed in his leg. He has been a smoker for many years and has given up straight drugs several years ago. He continues to smoke about 4-5 cigarettes a day. 02/03/2015 -- received a note from 05/14/2013 where Dr. Leotis Pain placed an inferior vena cava filter. The patient had a deep vein thrombosis while therapeutic on anticoagulation for previous DVT and a IVC filter was placed for this. 02/10/2015 -- he did have his vascular test done on Friday but we have no reports yet. 02/17/2015 -- notes were reviewed from the vascular office and the patient had a venous ultrasound done which revealed that he had no reflux in the greater saphenous vein or the short saphenous vein bilaterally. He did have subacute DVT in the common femoral vein and popliteal veins on the right and left side. The recommendation was to continue with Unna's boot therapy at the wound clinic and then to wear graduated compression stockings once the ulcers healed and later if he had continuous problems lymphedema pump would benefit him. 03/17/2015 -- we have applied for his insurance and aide regarding cellular tissue-based products and are still awaiting the final clearance. 03/24/2015 -- he has had Apligraf authorized for him but his wound is looking so good today that we may not use it. 03/31/2015 -- he has not yet received his compression stockings though we have called a couple of times and hopefully they should arrive this week. [name], [name]  ([id number]) READMISSION 01/06/16; this is a patient we have  previously cared for in this clinic with wounds on his right medial ankle. I was not previously involved with his care. 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 He wears compression stockings. He had reflux studies done during his last stay in this clinic which did not show significant reflux in the greater or lesser saphenous veins bilaterally. His history is that he developed a open sore on the left medial malleolus one week ago. He was seen in his primary physician office and given a course of doxycycline which he still should be on. Previously seen vascular surgery who felt that he had some degree of lymphedema as well. He is not a diabetic 01/13/16 no major change 01/20/16; very small wound on the medial right ankle again covered with surface slough that doesn't seem to be spotting the Prisma 01/27/16; patient comes in today complaining of a lot of pain around the wound site. He has not been systemically unwell. 02/03/16; the patient's wound culture last week grew Proteus, I had empirically given doxycycline. The Proteus was not specifically plated against doxycycline however Proteus itself was fairly pansensitive and the patient comes back feeling a lot better today. I think the doxycycline was likely to be successful in sufficient 02/10/16; as predicted last week the area has closed over. These are probably venous insufficiency wounds although his previous reflux studies did not show superficial reflux. He also has a history of DVT and at one time had a Greenfield filter in place. The area in question on his left medial ankle region. It became secondarily infected but responded nicely to antibiotics. He is closed today 02/17/16 unfortunately patient's venous wound on the medial aspect of his right ankle at this point in time has reopened.  He has been using some compression hose which appear to be very light that he purchased he tells me out of a magazine. He seems a little frustrated with the fact that this has reopened and is concerned about his left lower extremity possibly reopening as well. 02/25/16 patient presents today for follow-up evaluation regarding his right ankle wound. Currently he shows no interval signs or symptoms of infection. We have been compression wrapping him unfortunately the wraps that we had on him last week and he has a significant amount of swelling above whether this had slipped down to. He also notes that he's been having some burning as well at the wound site. He rates his discomfort at this point in time to be a 2-3 out of 10. Otherwise he has no other worsening symptoms. 03/03/16; this is a patient that had a wound on his left medial ankle that I discharged on 02/10/16. He apparently reappeared the next week with open areas on his right medial ankle. Her intake nurse reports today that he has a lot of drainage and odor at intake even after the wound was cleaned. Also of note the patient complains of edema in the left leg and showed up with only one of the 2 layer compression system. 03/05/2016 -- since his visit 2 days ago to see Dr. Dellia Nims he complained of significant pain in his right lower extremity which was much more than he's ever had before. He came in for an urgent visit to review his condition. He has been placed on doxycycline empirically and his culture reports were reviewed but the final result is not back. 03/10/16;  patient was in last week to see Dr. Con Memos with increasing pain in his leg. He was reduced to a 3 layer compression from 4 which seems to have helped overall. Culture from last week grew again pansensitive Proteus, this should've been sensitive to the doxycycline I gave him and he is finishing that today. The patient is had previous arterial and venous review by vascular  surgery. Patient is currently using Aquacel Ag under a 3 layer compression. 03/17/16; patient's wound dimensions are down this week. He has been using silver alginate 03/24/2016 - Mark. [facility] arrives today for management of RLE venous ulcer. The alginate dressing is Mckendry, Kailyn ([id number]) densly adhered to the ulcer. He offers no complaints, concerns, or needs. 03/31/16; no real change in the wound measurements post debridement. Using Prisma. If anything the measurements are larger today at 2 x 1 cm post debridement 04-07-16 Mark. Tomei arrives today for management of his right lower extremity venous ulcer. He is voicing no complaints associated with his wound over the last week. He does inquire about need for compression therapy, this appears to be a weekly inquiry. He was advised that compression therapy is indicated throughout the treatment of the wound and he will then transition to compression stockings. He is compliant with compression stockings to the left lower extremity. 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 The base of the wound is healthy we're using Hydrofera Blue. Measurements are smaller Electronic Signature(s) Signed: 04/14/2016 4:55:30 PM By: [name] MD Entered By: [name] on 04/14/2016 58:09:98 [name] ([id number]) -------------------------------------------------------------------------------- Physical Exam Details [place], Date of Service: 04/14/2016 8:15 AM Patient Name: [name] Patient Account Number: [id number] Medical Record Treating RN: Baruch Gouty RN, [name], Velva Harman [id number] Number: Other Clinician: Date of Birth/Sex: 01/01/1951 (65 y.o. Male) Treating Gavinn Collard Primary Care Physician/Extender: Tonye Pearson Physician: Referring Physician: Doristine Locks in Treatment: 14 Constitutional Sitting or standing Blood Pressure is within target range for patient.. Pulse  regular and within target range for patient.[name] Respirations regular, non-labored and within target range.. Temperature is normal and within the target range for the patient.. Patient's appearance is neat and clean. Appears in no acute distress. Well nourished and well developed.. Eyes Conjunctivae clear. No discharge.[name] Respiratory Respiratory effort is easy and symmetric bilaterally. Rate is normal at rest and on room air.. Cardiovascular Pedal pulses palpable and strong bilaterally.. Edema present in both extremities. Severe chronic venous insufficiency changes. Lymphatic None palpable in the popliteal or inguinal area. Psychiatric No evidence of depression, anxiety, or agitation. Calm, cooperative, and communicative. Appropriate interactions and affect.. Notes Wound exam; the wound is measuring smaller and the base of this appears to have healthy granulation. I did not feel that any debridement was required. There is raised edges around the surface of the wound and if the wound stalls in terms of healing a debridement will be necessary here. For now we will continue Hydrofera Blue. Previously stalled on collagen based dressings Electronic Signature(s) Signed: 04/14/2016 4:55:30 PM By: [name] MD Entered By: [name] on 04/14/2016 09:32:09 [name], [name] ([id number]) -------------------------------------------------------------------------------- Physician Orders Details [place], Date of Service: 04/14/2016 8:15 AM Patient Name: [name] Patient Account Number: [id number] Medical Record Treating RN: Baruch Gouty RN, [name], Velva Harman [id number] Number: Other Clinician: Date of Birth/Sex: November 16, 1950 (65 y.o. Male) Treating Jaymarion Trombly Primary Care Physician/Extender: Tonye Pearson Physician: Referring Physician: Doristine Locks in Treatment: 53 Verbal / Phone Orders: Yes Clinician: Afful, RN, [name], Rita Read Back and  Verified: Yes Diagnosis Coding Wound  Cleansing Wound #3 Right Lower Leg o Clean wound with Normal Saline. o Cleanse wound with mild soap and water - at clinic Anesthetic Wound #3 Right Lower Leg o Topical Lidocaine 4% cream applied to wound bed prior to debridement Skin Barriers/Peri-Wound Care Wound #3 Right Lower Leg o Barrier cream Primary Wound Dressing Wound #3 Right Lower Leg o Hydrafera Blue Secondary Dressing Wound #3 Right Lower Leg o ABD pad o Dry Gauze Dressing Change Frequency Wound #3 Right Lower Leg o Change dressing every week Follow-up Appointments Wound #3 Right Lower Leg o Return Appointment in 1 week. Edema Control Wound #3 Right Lower Leg Hubert, Sion ([id number]) o 3 Layer Compression System - Right Lower Extremity - unna to anchor Additional Orders / Instructions Wound #3 Right Lower Leg o Increase protein intake. Electronic Signature(s) Signed: 04/14/2016 4:11:19 PM By: [name] [name], RN Signed: 04/14/2016 4:55:30 PM By: [name] MD Entered By: [name] on 04/14/2016 08:58:43 [name], [name] ([id number]) -------------------------------------------------------------------------------- Problem List Details [name], Date of Service: 04/14/2016 8:15 AM Patient Name: [facility] Patient Account Number: [id number] Medical Record Treating RN: Baruch Gouty RN, [name], Velva Harman [id number] Number: Other Clinician: Date of Birth/Sex: [birth year] (65 y.o. Male) Treating [name] Primary Care Physician/Extender: Tonye Pearson Physician: Referring Physician: Doristine Locks in Treatment: 14 Active Problems ICD-10 Encounter Code Description Active Date Diagnosis L97.311 Non-pressure chronic ulcer of right ankle limited to 03/03/2016 Yes breakdown of skin I87.331 Chronic venous hypertension (idiopathic) with ulcer and 03/03/2016 Yes inflammation of right lower extremity Inactive Problems Resolved Problems ICD-10 Code Description Active Date  Resolved Date L97.321 Non-pressure chronic ulcer of left ankle limited to 01/06/2016 01/06/2016 breakdown of skin I87.332 Chronic venous hypertension (idiopathic) with ulcer and 01/06/2016 01/06/2016 inflammation of left lower extremity Electronic Signature(s) Signed: 04/14/2016 4:55:30 PM By: [name] MD Entered By: [name] on 04/14/2016 09:27:59 [name], [name] ([id number]) -------------------------------------------------------------------------------- Progress Note Details [name], Date of Service: 04/14/2016 8:15 AM Patient Name: [name] Patient Account Number: [id number] Medical Record Treating RN: Baruch Gouty, RN, [name], Velva Harman [id number] Number: Other Clinician: Date of Birth/Sex: May 30, 1950 (65 y.o. Male) Treating Joshuah Minella Primary Care Physician/Extender: Tonye Pearson Physician: Referring Physician: Doristine Locks in Treatment: 14 Subjective Chief Complaint Information obtained from Patient Mark Ridgely presents today for routine management on his RLE venous ulcer History of Present Illness (HPI) The following HPI elements were documented for the patient's wound: Location: open wound just above his right ankle medially Quality: Patient tells me he is not having a significant amount of pain at this point in time. Severity: 1 out of 10 Duration: This just reopened in the past week. Timing: Pain in wound is Intermittent Context: The wound appeared gradually over time Modifying Factors: Consults to this date include: he was seen in the ER and was referred to a vascular surgeon but the patient has not done that. He may have been treated with clindamycin in the ER. Associated Signs and Symptoms: Patient reports having difficulty standing for long periods. 65 year old gentleman who was seen in the emergency department recently on 01/06/2015 for a wound of his right lower extremity which he says was not involving any injury and he did not know how he  sustained it. He had draining foul-smelling liquid from the area and had gone for care there. his past medical history is significant for DVT, hypertension, gout, tobacco abuse, cocaine abuse, stroke, atrial fibrillation, pulmonary embolism. he has also had some vascular surgery with a stent placed  in his leg. He has been a smoker for many years and has given up straight drugs several years ago. He continues to smoke about 4-5 cigarettes a day. 02/03/2015 -- received a note from 05/14/2013 where Dr. Leotis Pain placed an inferior vena cava filter. The patient had a deep vein thrombosis while therapeutic on anticoagulation for previous DVT and a IVC filter was placed for this. 02/10/2015 -- he did have his vascular test done on Friday but we have no reports yet. 02/17/2015 -- notes were reviewed from the vascular office and the patient had a venous ultrasound done which revealed that he had no reflux in the greater saphenous vein or the short saphenous vein bilaterally. He did have subacute DVT in the common femoral vein and popliteal veins on the right and left side. The recommendation was to continue with Unna's boot therapy at the wound clinic and then to wear graduated compression stockings once the ulcers healed and later if he had continuous problems lymphedema pump would benefit him. JAVIEN, TESCH ([id number]) 03/17/2015 -- we have applied for his insurance and aide regarding cellular tissue-based products and are still awaiting the final clearance. 03/24/2015 -- he has had Apligraf authorized for him but his wound is looking so good today that we may not use it. 03/31/2015 -- he has not yet received his compression stockings though we have called a couple of times and hopefully they should arrive this week. READMISSION 01/06/16; this is a patient we have previously cared for in this clinic with wounds on his right medial ankle. I was not previously involved with his care. He has a  history of DVT and is on chronic Coumadin and one point had an inferior vena cava filter I'm not sure if that is still in place. He wears compression stockings. He had reflux studies done during his last stay in this clinic which did not show significant reflux in the greater or lesser saphenous veins bilaterally. His history is that he developed a open sore on the left medial malleolus one week ago. He was seen in his primary physician office and given a course of doxycycline which he still should be on. Previously seen vascular surgery who felt that he had some degree of lymphedema as well. He is not a diabetic 01/13/16 no major change 01/20/16; very small wound on the medial right ankle again covered with surface slough that doesn't seem to be spotting the Prisma 01/27/16; patient comes in today complaining of a lot of pain around the wound site. He has not been systemically unwell. 02/03/16; the patient's wound culture last week grew Proteus, I had empirically given doxycycline. The Proteus was not specifically plated against doxycycline however Proteus itself was fairly pansensitive and the patient comes back feeling a lot better today. I think the doxycycline was likely to be successful in sufficient 02/10/16; as predicted last week the area has closed over. These are probably venous insufficiency wounds although his previous reflux studies did not show superficial reflux. He also has a history of DVT and at one time had a Greenfield filter in place. The area in question on his left medial ankle region. It became secondarily infected but responded nicely to antibiotics. He is closed today 02/17/16 unfortunately patient's venous wound on the medial aspect of his right ankle at this point in time has reopened. He has been using some compression hose which appear to be very light that he purchased he tells me out of a magazine. He seems  a little frustrated with the fact that this has reopened and  is concerned about his left lower extremity possibly reopening as well. 02/25/16 patient presents today for follow-up evaluation regarding his right ankle wound. Currently he shows no interval signs or symptoms of infection. We have been compression wrapping him unfortunately the wraps that we had on him last week and he has a significant amount of swelling above whether this had slipped down to. He also notes that he's been having some burning as well at the wound site. He rates his discomfort at this point in time to be a 2-3 out of 10. Otherwise he has no other worsening symptoms. 03/03/16; this is a patient that had a wound on his left medial ankle that I discharged on 02/10/16. He apparently reappeared the next week with open areas on his right medial ankle. Her intake nurse reports today that he has a lot of drainage and odor at intake even after the wound was cleaned. Also of note the patient complains of edema in the left leg and showed up with only one of the 2 layer compression system. 03/05/2016 -- since his visit 2 days ago to see Dr. Dellia Nims he complained of significant pain in his right lower extremity which was much more than he's ever had before. He came in for an urgent visit to review his condition. He has been placed on doxycycline empirically and his culture reports were reviewed but the final result is not back. 03/10/16; patient was in last week to see Dr. Con Memos with increasing pain in his leg. He was reduced to a 3 Rog, Nuri ([id number]) layer compression from 4 which seems to have helped overall. Culture from last week grew again pansensitive Proteus, this should've been sensitive to the doxycycline I gave him and he is finishing that today. The patient is had previous arterial and venous review by vascular surgery. Patient is currently using Aquacel Ag under a 3 layer compression. 03/17/16; patient's wound dimensions are down this week. He has been using silver  alginate 03/24/2016 - Mark. Sedivy arrives today for management of RLE venous ulcer. The alginate dressing is densly adhered to the ulcer. He offers no complaints, concerns, or needs. 03/31/16; no real change in the wound measurements post debridement. Using Prisma. If anything the measurements are larger today at 2 x 1 cm post debridement 04-07-16 Mark. Tomei arrives today for management of his right lower extremity venous ulcer. He is voicing no complaints associated with his wound over the last week. He does inquire about need for compression therapy, this appears to be a weekly inquiry. He was advised that compression therapy is indicated throughout the treatment of the wound and he will then transition to compression stockings. He is compliant with compression stockings to the left lower extremity. 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 The base of the wound is healthy we're using Hydrofera Blue. Measurements are smaller Objective Constitutional Sitting or standing Blood Pressure is within target range for patient.. Pulse regular and within target range for patient.[name] Respirations regular, non-labored and within target range.. Temperature is normal and within the target range for the patient.. Patient's appearance is neat and clean. Appears in no acute distress. Well nourished and well developed.. Vitals Time Taken: 8:19 AM, Height: 74 in, Weight: 240 lbs, BMI: 30.8, Temperature: 97.7 F, Pulse: 66 bpm, Respiratory Rate: 19 breaths/min, Blood Pressure: 148/70 mmHg. Eyes Conjunctivae clear. No discharge.[name] Respiratory Respiratory effort is  easy and symmetric bilaterally. Rate is normal at rest and on room air.. Cardiovascular Pedal pulses palpable and strong bilaterally.. Edema present in both extremities. Severe chronic venous insufficiency changes. Lymphatic None palpable in the popliteal or inguinal area. Psychiatric [name],  [name] ([id number]) No evidence of depression, anxiety, or agitation. Calm, cooperative, and communicative. Appropriate interactions and affect.. General Notes: Wound exam; the wound is measuring smaller and the base of this appears to have healthy granulation. I did not feel that any debridement was required. There is raised edges around the surface of the wound and if the wound stalls in terms of healing a debridement will be necessary here. For now we will continue Hydrofera Blue. Previously stalled on collagen based dressings Integumentary (Hair, Skin) Wound #3 status is Open. Original cause of wound was Gradually Appeared. The wound is located on the Right Lower Leg. The wound measures 1.5cm length x 0.7cm width x 0.1cm depth; 0.825cm^2 area and 0.082cm^3 volume. The wound is limited to skin breakdown. There is no tunneling or undermining noted. There is a medium amount of serosanguineous drainage noted. The wound margin is flat and intact. There is medium (34-66%) red, pink, pale granulation within the wound bed. There is no necrotic tissue within the wound bed. The periwound skin appearance exhibited: Induration, Scarring, Moist, Hemosiderin Staining, Mottled. The periwound skin appearance did not exhibit: Callus, Crepitus, Excoriation, Fluctuance, Friable, Localized Edema, Rash, Dry/Scaly, Maceration, Atrophie Blanche, Cyanosis, Ecchymosis, Pallor, Rubor, Erythema. Periwound temperature was noted as No Abnormality. 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 Cleanse wound with mild soap and water - at clinic Anesthetic: Wound #3 Right Lower Leg: Topical Lidocaine 4% cream applied to wound bed prior to debridement Skin  Barriers/Peri-Wound Care: Wound #3 Right Lower Leg: Barrier cream Primary Wound Dressing: Wound #3 Right Lower Leg: Jagielski, Gabriella ([id number]) Hydrafera Blue Secondary Dressing: Wound #3 Right Lower Leg: ABD pad Dry Gauze Dressing Change Frequency: Wound #3 Right Lower Leg: Change dressing every week Follow-up Appointments: Wound #3 Right Lower Leg: Return Appointment in 1 week. Edema Control: Wound #3 Right Lower Leg: 3 Layer Compression System - Right Lower Extremity - unna to anchor Additional Orders / Instructions: Wound #3 Right Lower Leg: Increase protein intake. #1 wound appears healthy. Dimensions are improving. As long as this continues I think we continue with the same dressing which is Hydrofera Blue as the primary wound dressing #2 if it stalls and further evaluations, a debridement of the circumferential skin and subcutaneous tissue is likely to be necessary. Hopefully this will not be necessary however Electronic Signature(s) Signed: 04/14/2016 4:55:30 PM By: [name] MD Entered By: [name] on 04/14/2016 09:33:59 [name], [name] ([id number]) -------------------------------------------------------------------------------- SuperBill Details Hild, Date of Service: 04/14/2016 Patient Name: [facility] Patient Account Number: [id number] Medical Record Treating RN: Baruch Gouty RN, [name], Velva Harman [id number] Number: Other Clinician: Date of Birth/Sex: 27-Dec-1950 (65 y.o. Male) Treating Roma Bierlein Primary Care Physician/Extender: Tonye Pearson Physician: Weeks in Treatment: 14 Referring Physician: Royetta Crochet Diagnosis Coding ICD-10 Codes Code Description R42.706 Non-pressure chronic ulcer of right ankle limited to breakdown of skin Chronic venous hypertension (idiopathic) with ulcer and inflammation of right lower I87.331 extremity Facility Procedures CPT4: Description Modifier Quantity Code [id number] (Facility Use Only) [id number] - APPLY  MULTLAY COMPRS LWR RT 1 LEG  Physician Procedures CPT4: Description Modifier Quantity Code 1753010 40459 - WC PHYS LEVEL 3 - EST PT 1 ICD-10 Description Diagnosis L97.311 Non-pressure chronic ulcer of right ankle limited to breakdown of skin I87.331 Chronic venous hypertension (idiopathic) with ulcer  and inflammation of right lower extremity Electronic Signature(s) Signed: 04/14/2016 4:55:30 PM By: [name] MD Entered By: [name] on 04/14/2016 09:34:31

## 2016-04-15 NOTE — Progress Notes (Signed)
MOHAMADOU, MACIVER ([id number]) Visit Report for 04/14/2016 Arrival Information Details Nakatani, Date of Service: 04/14/2016 8:15 AM Patient Name: [facility] Patient Account Number: [id number] Medical Record Treating RN: Baruch Gouty, RN, [name], Velva Harman [id number] Number: Other Clinician: Date of Birth/Sex: Sep 14, 1950 (65 y.o. Male) Treating [name], MICHAEL Primary Care Physician/Extender: Tonye Pearson Physician: Referring Physician: Doristine Locks in Treatment: 78 Visit Information History Since Last Visit All ordered tests and consults were completed: No Patient Arrived: Ambulatory Added or deleted any medications: No Arrival Time: 08:18 Any new allergies or adverse reactions: No Accompanied By: self Had a fall or experienced change in No Transfer Assistance: None activities of daily living that may affect Patient Identification Verified: Yes risk of falls: Secondary Verification Process Yes Signs or symptoms of abuse/neglect since last No Completed: visito Patient Requires Transmission- No Hospitalized since last visit: No Based Precautions: Has Dressing in Place as Prescribed: Yes Patient Has Alerts: Yes Has Compression in Place as Prescribed: Yes Patient Alerts: Patient on Blood Pain Present Now: No Thinner warfarin Electronic Signature(s) Signed: 04/14/2016 4:11:19 PM By: [name] [name], RN Entered By: [name] on 04/14/2016 08:18:51 Emma, [name] ([id number]) -------------------------------------------------------------------------------- Encounter Discharge Information Details Maloney, Date of Service: 04/14/2016 8:15 AM Patient Name: [name] Patient Account Number: [id number] Medical Record Treating RN: Baruch Gouty, RN, [name], Velva Harman [id number] Number: Other Clinician: Date of Birth/Sex: [birth year] (65 y.o. Male) Treating [name], MICHAEL Primary Care Physician/Extender: Tonye Pearson Physician: Referring Physician: Doristine Locks in Treatment:  14 Encounter Discharge Information Items Discharge Pain Level: 0 Discharge Condition: Stable Ambulatory Status: Ambulatory Discharge Destination: Home Transportation: Private Auto Accompanied By: self Schedule Follow-up Appointment: No Medication Reconciliation completed and provided to Patient/Care No Euriah Matlack: Provided on Clinical Summary of Care: 04/14/2016 Form Type Recipient Paper Patient WD Electronic Signature(s) Signed: 04/14/2016 9:00:56 AM By: Ruthine Dose Entered By: Ruthine Dose on 04/14/2016 09:00:56 Goessel, [name] -------------------------------------------------------------------------------- Lower Extremity Assessment Details Otoole, Date of Service: 04/14/2016 8:15 AM Patient Name: [name] Patient Account Number: [id number] Medical Record Treating RN: Baruch Gouty RN, [name], Velva Harman [id number] Number: Other Clinician: Date of Birth/Sex: 03-07-1951 (65 y.o. Male) Treating [name], MICHAEL Primary Care Physician/Extender: Tonye Pearson Physician: Referring Physician: Doristine Locks in Treatment: 14 Edema Assessment Assessed: [Left: No] [Right: No] Edema: [Left: N] [Right: o] Calf Left: Right: Point of Measurement: 36 cm From Medial Instep cm 42 cm Ankle Left: Right: Point of Measurement: 14 cm From Medial Instep cm 26.3 cm Vascular Assessment Claudication: Claudication Assessment [Right:None] Pulses: Posterior Tibial Dorsalis Pedis Palpable: [Right:Yes] Extremity colors, hair growth, and conditions: Extremity Color: [Right:Dusky] Hair Growth on Extremity: [Right:Yes] Temperature of Extremity: [Right:Warm] Capillary Refill: [Right:< 3 seconds] Toe Nail Assessment Left: Right: Thick: Yes Discolored: Yes Deformed: No Improper Length and Hygiene: No Electronic Signature(s) MOIZ, RYANT ([id number]) Signed: 04/14/2016 4:11:19 PM By: [name] [name], RN Entered By: [name] on 04/14/2016 08:19:34 Kimura, [name]  ([id number]) -------------------------------------------------------------------------------- Multi Wound Chart Details Mcginn, Date of Service: 04/14/2016 8:15 AM Patient Name: [name] Patient Account Number: [id number] Medical Record Treating RN: Baruch Gouty RN, [name], Velva Harman [id number] Number: Other Clinician: Date of Birth/Sex: 08/23/1950 (65 y.o. Male) Treating [name], MICHAEL Primary Care Physician/Extender: Tonye Pearson Physician: Referring Physician: Doristine Locks in Treatment: 14 Vital Signs Height(in): 74 Pulse(bpm): 66 Weight(lbs): 240 Blood Pressure 148/70 (mmHg): Body Mass Index(BMI): 31 Temperature(F): 97.7 Respiratory Rate 19 (breaths/min): Photos: [3:No Photos] [N/A:N/A] Wound Location: [3:Right Lower Leg] [N/A:N/A] Wounding Event: [3:Gradually Appeared] [N/A:N/A] Primary Etiology: [3:Venous Leg Ulcer] [N/A:N/A] Comorbid History: [3:Arrhythmia, Hypertension, N/A Gout,  Neuropathy] Date Acquired: [3:02/11/2016] [N/A:N/A] Weeks of Treatment: [3:8] [N/A:N/A] Wound Status: [3:Open] [N/A:N/A] Clustered Wound: [3:Yes] [N/A:N/A] Measurements L x W x D 1.5x0.7x0.1 [N/A:N/A] (cm) Area (cm) : [3:0.825] [N/A:N/A] Volume (cm) : [3:0.082] [N/A:N/A] % Reduction in Area: [3:84.90%] [N/A:N/A] % Reduction in Volume: 85.00% [N/A:N/A] Classification: [3:Full Thickness Without Exposed Support Structures] [N/A:N/A] Exudate Amount: [3:Medium] [N/A:N/A] Exudate Type: [3:Serosanguineous] [N/A:N/A] Exudate Color: [3:red, brown] [N/A:N/A] Wound Margin: [3:Flat and Intact] [N/A:N/A] Granulation Amount: [3:Medium (34-66%)] [N/A:N/A] Granulation Quality: [3:Red, Pink, Pale] [N/A:N/A] Necrotic Amount: [3:None Present (0%)] [N/A:N/A] Exposed Structures: [N/A:N/A] Fascia: No Fat: No Tendon: No Muscle: No Joint: No Bone: No Limited to Skin Breakdown Epithelialization: Medium (34-66%) N/A N/A Periwound Skin Texture: Induration: Yes N/A N/A Scarring: Yes Edema:  No Excoriation: No Callus: No Crepitus: No Fluctuance: No Friable: No Rash: No Periwound Skin Moist: Yes N/A N/A Moisture: Maceration: No Dry/Scaly: No Periwound Skin Color: Hemosiderin Staining: Yes N/A N/A Mottled: Yes Atrophie Blanche: No Cyanosis: No Ecchymosis: No Erythema: No Pallor: No Rubor: No Temperature: No Abnormality N/A N/A Tenderness on No N/A N/A Palpation: Wound Preparation: Ulcer Cleansing: N/A N/A Rinsed/Irrigated with Saline, Other: Soap and water Topical Anesthetic Applied: Other: lidocaine 4% Treatment Notes Electronic Signature(s) Signed: 04/14/2016 4:11:19 PM By: [name] [name], RN Entered By: [name] on 04/14/2016 08:58:23 Renfroe, [name] ([id number]) -------------------------------------------------------------------------------- Multi-Disciplinary Care Plan Details Mulgrew, Date of Service: 04/14/2016 8:15 AM Patient Name: [name] Patient Account Number: [id number] Medical Record Treating RN: Baruch Gouty RN, [name], Velva Harman [id number] Number: Other Clinician: Date of Birth/Sex: 24-Jul-1950 (65 y.o. Male) Treating [name], MICHAEL Primary Care Physician/Extender: Tonye Pearson Physician: Referring Physician: Doristine Locks in Treatment: 14 Active Inactive Venous Leg Ulcer Nursing Diagnoses: Actual venous Insuffiency (use after diagnosis is confirmed) Goals: Patient will maintain optimal edema control Date Initiated: 02/17/2016 Goal Status: Active Interventions: Compression as ordered Notes: Wound/Skin Impairment Nursing Diagnoses: Impaired tissue integrity Goals: Patient/caregiver will verbalize understanding of skin care regimen Date Initiated: 02/17/2016 Goal Status: Active Ulcer/skin breakdown will have a volume reduction of 30% by week 4 Date Initiated: 02/17/2016 Goal Status: Active Interventions: Assess ulceration(s) every visit Notes: Electronic Signature(s) ODIE, EDMONDS ([id number]) Signed: 04/14/2016  4:11:19 PM By: [name] [name], RN Entered By: [name] on 04/14/2016 08:58:14 Sedor, [name] ([id number]) -------------------------------------------------------------------------------- Pain Assessment Details Memmott, Date of Service: 04/14/2016 8:15 AM Patient Name: [name] Patient Account Number: [id number] Medical Record Treating RN: Baruch Gouty RN, [name], Velva Harman [id number] Number: Other Clinician: Date of Birth/Sex: [birth year] (65 y.o. Male) Treating [name], MICHAEL Primary Care Physician/Extender: Tonye Pearson Physician: Referring Physician: Doristine Locks in Treatment: 14 Active Problems Location of Pain Severity and Description of Pain Patient Has Paino No Site Locations With Dressing Change: No Pain Management and Medication Current Pain Management: Electronic Signature(s) Signed: 04/14/2016 4:11:19 PM By: [name] [name], RN Entered By: [name] on 04/14/2016 08:18:59 Straka, [name] ([id number]) -------------------------------------------------------------------------------- Patient/Caregiver Education Details Appleman, Date of Service: 04/14/2016 8:15 AM Patient Name: [name] Patient Account Number: [id number] Medical Record Treating RN: Baruch Gouty RN, [name], Velva Harman [id number] Number: Other Clinician: Date of Birth/Gender: 1951-03-21 (65 y.o. Male) Treating [name], MICHAEL Primary Care Physician/Extender: Tonye Pearson Physician: Suella Grove in Treatment: 14 Referring Physician: Royetta Crochet Education Assessment Education Provided To: Patient Education Topics Provided Venous: Methods: Explain/Verbal Responses: State content correctly Wound/Skin Impairment: Methods: Explain/Verbal Responses: State content correctly Electronic Signature(s) Signed: 04/14/2016 4:11:19 PM By: [name] [name], RN Entered By: [name] on 04/14/2016 09:00:28 Bring, [name]  ([id number]) -------------------------------------------------------------------------------- Wound Assessment Details Brunell, Date of Service:  04/14/2016 8:15 AM Patient Name: [facility] Patient Account Number: [id number] Medical Record Treating RN: Afful, RN, [name], Velva Harman [id number] Number: Other Clinician: Date of Birth/Sex: 10-27-1950 (65 y.o. Male) Treating [name], MICHAEL Primary Care Physician/Extender: Tonye Pearson Physician: Referring Physician: Doristine Locks in Treatment: 14 Wound Status Wound Number: 3 Primary Venous Leg Ulcer Etiology: Wound Location: Right Lower Leg Wound Status: Open Wounding Event: Gradually Appeared Comorbid Arrhythmia, Hypertension, Gout, Date Acquired: 02/11/2016 History: Neuropathy Weeks Of Treatment: 8 Clustered Wound: Yes Photos Photo Uploaded By: [name] on 04/14/2016 16:37:08 Wound Measurements Length: (cm) 1.5 Width: (cm) 0.7 Depth: (cm) 0.1 Area: (cm) 0.825 Volume: (cm) 0.082 % Reduction in Area: 84.9% % Reduction in Volume: 85% Epithelialization: Medium (34-66%) Tunneling: No Undermining: No Wound Description Full Thickness Without Exposed Classification: Support Structures Sepulveda, [name] ([id number]) Foul Odor After Cleansing: No Wound Margin: Flat and Intact Exudate Medium Amount: Exudate Type: Serosanguineous Exudate Color: red, brown Wound Bed Granulation Amount: Medium (34-66%) Exposed Structure Granulation Quality: Red, Pink, Pale Fascia Exposed: No Necrotic Amount: None Present (0%) Fat Layer Exposed: No Tendon Exposed: No Muscle Exposed: No Joint Exposed: No Bone Exposed: No Limited to Skin Breakdown Periwound Skin Texture Texture Color No Abnormalities Noted: No No Abnormalities Noted: No Callus: No Atrophie Blanche: No Crepitus: No Cyanosis: No Excoriation: No Ecchymosis: No Fluctuance: No Erythema: No Friable: No Hemosiderin Staining: Yes Induration: Yes Mottled:  Yes Localized Edema: No Pallor: No Rash: No Rubor: No Scarring: Yes Temperature / Pain Moisture Temperature: No Abnormality No Abnormalities Noted: No Dry / Scaly: No Maceration: No Moist: Yes Wound Preparation Ulcer Cleansing: Rinsed/Irrigated with Saline, Other: Soap and water, Topical Anesthetic Applied: Other: lidocaine 4%, Treatment Notes Wound #3 (Right Lower Leg) 1. Cleansed with: Cleanse wound with antibacterial soap and water 3. Peri-wound Care: Barrier cream Moisturizing lotion 4. Dressing Applied: ADEMOLA, VERT ([id number]) Hydrafera Blue 5. Secondary Dressing Applied Dry Gauze 7. Secured with 3 Layer Compression System - Right Lower Extremity Electronic Signature(s) Signed: 04/14/2016 4:11:19 PM By: [name] [name], RN Entered By: [name] on 04/14/2016 08:48:15 Dilks, [name] ([id number]) -------------------------------------------------------------------------------- Vitals Details Kasinger, Date of Service: 04/14/2016 8:15 AM Patient Name: [name] Patient Account Number: [id number] Medical Record Treating RN: Baruch Gouty, RN, [name], Velva Harman [id number] Number: Other Clinician: Date of Birth/Sex: 1951-01-16 (65 y.o. Male) Treating [name], MICHAEL Primary Care Physician/Extender: Tonye Pearson Physician: Referring Physician: Doristine Locks in Treatment: 14 Vital Signs Time Taken: 08:19 Temperature (F): 97.7 Height (in): 74 Pulse (bpm): 66 Weight (lbs): 240 Respiratory Rate (breaths/min): 19 Body Mass Index (BMI): 30.8 Blood Pressure (mmHg): 148/70 Reference Range: 80 - 120 mg / dl Electronic Signature(s) Signed: 04/14/2016 4:11:19 PM By: [name] [name], RN Entered By: [name] on 04/14/2016 27:78:24

## 2016-04-21 ENCOUNTER — Encounter: Payer: Medicare Other | Attending: Internal Medicine | Admitting: Internal Medicine

## 2016-04-21 DIAGNOSIS — I87331 Chronic venous hypertension (idiopathic) with ulcer and inflammation of right lower extremity: Secondary | ICD-10-CM | POA: Insufficient documentation

## 2016-04-21 DIAGNOSIS — L97311 Non-pressure chronic ulcer of right ankle limited to breakdown of skin: Secondary | ICD-10-CM | POA: Insufficient documentation

## 2016-04-21 DIAGNOSIS — Z8673 Personal history of transient ischemic attack (TIA), and cerebral infarction without residual deficits: Secondary | ICD-10-CM | POA: Diagnosis not present

## 2016-04-21 DIAGNOSIS — M109 Gout, unspecified: Secondary | ICD-10-CM | POA: Diagnosis not present

## 2016-04-21 DIAGNOSIS — F17210 Nicotine dependence, cigarettes, uncomplicated: Secondary | ICD-10-CM | POA: Insufficient documentation

## 2016-04-21 DIAGNOSIS — I4891 Unspecified atrial fibrillation: Secondary | ICD-10-CM | POA: Diagnosis not present

## 2016-04-21 DIAGNOSIS — Z86718 Personal history of other venous thrombosis and embolism: Secondary | ICD-10-CM | POA: Insufficient documentation

## 2016-04-21 DIAGNOSIS — I10 Essential (primary) hypertension: Secondary | ICD-10-CM | POA: Insufficient documentation

## 2016-04-21 DIAGNOSIS — Z7901 Long term (current) use of anticoagulants: Secondary | ICD-10-CM | POA: Diagnosis not present

## 2016-04-22 NOTE — Progress Notes (Signed)
KOJO, LIBY ([id number]) Visit Report for 04/21/2016 Arrival Information Details Kostick, Date of Service: 04/21/2016 8:15 AM Patient Name: [name] Patient Account Number: [id number] Medical Record Treating RN: Baruch Gouty, RN, [name], Velva Harman [id number] Number: Other Clinician: Date of Birth/Sex: 1950/07/11 (65 y.o. Male) Treating Dellia Nims, MICHAEL Primary Care Physician/Extender: Tonye Pearson Physician: Referring Physician: Doristine Locks in Treatment: 15 Visit Information History Since Last Visit All ordered tests and consults were completed: No Patient Arrived: Cane Added or deleted any medications: No Arrival Time: 08:33 Any new allergies or adverse reactions: No Accompanied By: self Had a fall or experienced change in No Transfer Assistance: None activities of daily living that may affect Patient Identification Verified: Yes risk of falls: Secondary Verification Process Yes Signs or symptoms of abuse/neglect since last No Completed: visito Patient Requires Transmission- No Hospitalized since last visit: No Based Precautions: Has Dressing in Place as Prescribed: Yes Patient Has Alerts: Yes Has Compression in Place as Prescribed: Yes Patient Alerts: Patient on Blood Pain Present Now: No Thinner warfarin Electronic Signature(s) Signed: 04/21/2016 4:48:54 PM By: [name] [name], RN Entered By: [name] on 04/21/2016 08:33:32 [name], [name] ([id number]) -------------------------------------------------------------------------------- Encounter Discharge Information Details Stern, Date of Service: 04/21/2016 8:15 AM Patient Name: [name] Patient Account Number: [id number] Medical Record Treating RN: Baruch Gouty, RN, [name], Velva Harman [id number] Number: Other Clinician: Date of Birth/Sex: 1950-11-24 (65 y.o. Male) Treating [name], MICHAEL Primary Care Physician/Extender: Tonye Pearson Physician: Referring Physician: Doristine Locks in Treatment: 15 Encounter  Discharge Information Items Discharge Pain Level: 0 Discharge Condition: Stable Ambulatory Status: Cane Discharge Destination: Home Transportation: Private Auto Accompanied By: self Schedule Follow-up Appointment: No Medication Reconciliation completed No and provided to Patient/Care Keely Drennan: Provided on Clinical Summary of Care: 04/21/2016 Form Type Recipient Paper Patient WD Electronic Signature(s) Signed: 04/21/2016 8:59:50 AM By: Ruthine Dose Entered By: Ruthine Dose on 04/21/2016 08:59:50 [name], [name] ([id number]) -------------------------------------------------------------------------------- Lower Extremity Assessment Details Rowlette, Date of Service: 04/21/2016 8:15 AM Patient Name: [name] Patient Account Number: [id number] Medical Record Treating RN: Baruch Gouty, RN, [name], Velva Harman [id number] Number: Other Clinician: Date of Birth/Sex: 09/09/1950 (65 y.o. Male) Treating [name], MICHAEL Primary Care Physician/Extender: Tonye Pearson Physician: Referring Physician: Doristine Locks in Treatment: 15 Edema Assessment Assessed: [Left: No] [Right: No] E[Left: dema] [Right: :] Calf Left: Right: Point of Measurement: 36 cm From Medial Instep cm 42.1 cm Ankle Left: Right: Point of Measurement: 14 cm From Medial Instep cm 26.3 cm Vascular Assessment Claudication: Claudication Assessment [Right:None] Pulses: Posterior Tibial Dorsalis Pedis Palpable: [Right:Yes] Extremity colors, hair growth, and conditions: Extremity Color: [Right:Hyperpigmented] Hair Growth on Extremity: [Right:Yes] Temperature of Extremity: [Right:Warm] Capillary Refill: [Right:< 3 seconds] Toe Nail Assessment Left: Right: Thick: Yes Discolored: Yes Deformed: No Improper Length and Hygiene: No Electronic Signature(s) [name], [name] ([id number]) Signed: 04/21/2016 4:48:54 PM By: [name] [name], RN Entered By: [name] on 04/21/2016 08:34:09 [name], [name]  ([id number]) -------------------------------------------------------------------------------- Multi Wound Chart Details Ziebell, Date of Service: 04/21/2016 8:15 AM Patient Name: [name] Patient Account Number: [id number] Medical Record Treating RN: Baruch Gouty RN, [name], Velva Harman [id number] Number: Other Clinician: Date of Birth/Sex: August 03, 1950 (65 y.o. Male) Treating [name], MICHAEL Primary Care Physician/Extender: Tonye Pearson Physician: Referring Physician: Doristine Locks in Treatment: 15 Vital Signs Height(in): 74 Pulse(bpm): 67 Weight(lbs): 240 Blood Pressure 160/98 (mmHg): Body Mass Index(BMI): 31 Temperature(F): 97.5 Respiratory Rate 18 (breaths/min): Photos: [3:No Photos] [N/A:N/A] Wound Location: [3:Right Lower Leg] [N/A:N/A] Wounding Event: [3:Gradually Appeared] [N/A:N/A] Primary Etiology: [3:Venous Leg Ulcer] [N/A:N/A] Comorbid History: [3:Arrhythmia, Hypertension, N/A Gout, Neuropathy]  Date Acquired: [3:02/11/2016] [N/A:N/A] Weeks of Treatment: [3:9] [N/A:N/A] Wound Status: [3:Open] [N/A:N/A] Clustered Wound: [3:Yes] [N/A:N/A] Measurements L x W x D 1.4x0.7x0.1 [N/A:N/A] (cm) Area (cm) : [3:0.77] [N/A:N/A] Volume (cm) : [3:0.077] [N/A:N/A] % Reduction in Area: [3:85.90%] [N/A:N/A] % Reduction in Volume: 85.90% [N/A:N/A] Classification: [3:Full Thickness Without Exposed Support Structures] [N/A:N/A] Exudate Amount: [3:Medium] [N/A:N/A] Exudate Type: [3:Serosanguineous] [N/A:N/A] Exudate Color: [3:red, brown] [N/A:N/A] Wound Margin: [3:Flat and Intact] [N/A:N/A] Granulation Amount: [3:Medium (34-66%)] [N/A:N/A] Granulation Quality: [3:Red, Pink, Pale] [N/A:N/A] Necrotic Amount: [3:None Present (0%)] [N/A:N/A] Exposed Structures: [N/A:N/A] Fascia: No Fat: No Tendon: No Muscle: No Joint: No Bone: No Limited to Skin Breakdown Epithelialization: Medium (34-66%) N/A N/A Periwound Skin Texture: Induration: Yes N/A N/A Scarring: Yes Edema:  No Excoriation: No Callus: No Crepitus: No Fluctuance: No Friable: No Rash: No Periwound Skin Moist: Yes N/A N/A Moisture: Maceration: No Dry/Scaly: No Periwound Skin Color: Hemosiderin Staining: Yes N/A N/A Mottled: Yes Atrophie Blanche: No Cyanosis: No Ecchymosis: No Erythema: No Pallor: No Rubor: No Temperature: No Abnormality N/A N/A Tenderness on No N/A N/A Palpation: Wound Preparation: Ulcer Cleansing: N/A N/A Rinsed/Irrigated with Saline, Other: Soap and water Topical Anesthetic Applied: Other: lidocaine 4% Treatment Notes Electronic Signature(s) Signed: 04/21/2016 4:48:54 PM By: [name] [name], RN Entered By: [name] on 04/21/2016 08:43:38 Tinnel, [name] ([id number]) -------------------------------------------------------------------------------- Multi-Disciplinary Care Plan Details Chilson, Date of Service: 04/21/2016 8:15 AM Patient Name: [name] Patient Account Number: [id number] Medical Record Treating RN: Baruch Gouty RN, [name], Velva Harman [id number] Number: Other Clinician: Date of Birth/Sex: 1950/08/03 (65 y.o. Male) Treating [name], MICHAEL Primary Care Physician/Extender: Tonye Pearson Physician: Referring Physician: Doristine Locks in Treatment: 15 Active Inactive Venous Leg Ulcer Nursing Diagnoses: Actual venous Insuffiency (use after diagnosis is confirmed) Goals: Patient will maintain optimal edema control Date Initiated: 02/17/2016 Goal Status: Active Interventions: Compression as ordered Notes: Wound/Skin Impairment Nursing Diagnoses: Impaired tissue integrity Goals: Patient/caregiver will verbalize understanding of skin care regimen Date Initiated: 02/17/2016 Goal Status: Active Ulcer/skin breakdown will have a volume reduction of 30% by week 4 Date Initiated: 02/17/2016 Goal Status: Active Interventions: Assess ulceration(s) every visit Notes: Electronic Signature(s) JURGEN, GROENEVELD ([id number]) Signed: 04/21/2016  4:48:54 PM By: [name] [name], RN Entered By: [name] on 04/21/2016 08:43:31 Wormley, [name] ([id number]) -------------------------------------------------------------------------------- Pain Assessment Details Ortwein, Date of Service: 04/21/2016 8:15 AM Patient Name: [name] Patient Account Number: [id number] Medical Record Treating RN: Baruch Gouty RN, [name], Velva Harman [id number] Number: Other Clinician: Date of Birth/Sex: [birth year] (65 y.o. Male) Treating [name], MICHAEL Primary Care Physician/Extender: Tonye Pearson Physician: Referring Physician: Doristine Locks in Treatment: 15 Active Problems Location of Pain Severity and Description of Pain Patient Has Paino No Site Locations With Dressing Change: No Pain Management and Medication Current Pain Management: Electronic Signature(s) Signed: 04/21/2016 4:48:54 PM By: [name] [name], RN Entered By: [name] on 04/21/2016 08:33:38 Miedema, [name] ([id number]) -------------------------------------------------------------------------------- Patient/Caregiver Education Details Cavitt, Date of Service: 04/21/2016 8:15 AM Patient Name: [name] Patient Account Number: [id number] Medical Record Treating RN: Baruch Gouty RN, [name], Velva Harman [id number] Number: Other Clinician: Date of Birth/Gender: 07-24-1950 (65 y.o. Male) Treating [name] Primary Care Physician/Extender: Tonye Pearson Physician: Suella Grove in Treatment: 15 Referring Physician: Royetta Crochet Education Assessment Education Provided To: Patient Education Topics Provided Wound/Skin Impairment: Methods: Explain/Verbal Responses: State content correctly Electronic Signature(s) Signed: 04/21/2016 4:48:54 PM By: [name] [name], RN Entered By: [name] on 04/21/2016 08:56:56 Billet, [name] ([id number]) -------------------------------------------------------------------------------- Wound Assessment Details Villegas, Date of Service: 04/21/2016  8:15 AM Patient Name: [name] Patient Account  Number: [id number] Medical Record Treating RN: Baruch Gouty, RN, [name], Velva Harman [id number] Number: Other Clinician: Date of Birth/Sex: 1950/10/14 (65 y.o. Male) Treating [name], MICHAEL Primary Care Physician/Extender: Tonye Pearson Physician: Referring Physician: Doristine Locks in Treatment: 15 Wound Status Wound Number: 3 Primary Venous Leg Ulcer Etiology: Wound Location: Right Lower Leg Wound Status: Open Wounding Event: Gradually Appeared Comorbid Arrhythmia, Hypertension, Gout, Date Acquired: 02/11/2016 History: Neuropathy Weeks Of Treatment: 9 Clustered Wound: Yes Photos Photo Uploaded By: [name] on 04/21/2016 16:47:31 Wound Measurements Length: (cm) 1.4 Width: (cm) 0.7 Depth: (cm) 0.1 Area: (cm) 0.77 Volume: (cm) 0.077 % Reduction in Area: 85.9% % Reduction in Volume: 85.9% Epithelialization: Medium (34-66%) Tunneling: No Undermining: No Wound Description Full Thickness Without Exposed Classification: Support Structures Zuver, [name] ([id number]) Foul Odor After Cleansing: No Wound Margin: Flat and Intact Exudate Medium Amount: Exudate Type: Serosanguineous Exudate Color: red, brown Wound Bed Granulation Amount: Medium (34-66%) Exposed Structure Granulation Quality: Red, Pink, Pale Fascia Exposed: No Necrotic Amount: None Present (0%) Fat Layer Exposed: No Tendon Exposed: No Muscle Exposed: No Joint Exposed: No Bone Exposed: No Limited to Skin Breakdown Periwound Skin Texture Texture Color No Abnormalities Noted: No No Abnormalities Noted: No Callus: No Atrophie Blanche: No Crepitus: No Cyanosis: No Excoriation: No Ecchymosis: No Fluctuance: No Erythema: No Friable: No Hemosiderin Staining: Yes Induration: Yes Mottled: Yes Localized Edema: No Pallor: No Rash: No Rubor: No Scarring: Yes Temperature / Pain Moisture Temperature: No Abnormality No Abnormalities Noted: No Dry /  Scaly: No Maceration: No Moist: Yes Wound Preparation Ulcer Cleansing: Rinsed/Irrigated with Saline, Other: Soap and water, Topical Anesthetic Applied: Other: lidocaine 4%, Treatment Notes Wound #3 (Right Lower Leg) 1. Cleansed with: Cleanse wound with antibacterial soap and water 3. Peri-wound Care: Barrier cream Moisturizing lotion 4. Dressing Applied: MACGUIRE, HOLSINGER ([id number]) Hydrafera Blue 5. Secondary Dressing Applied Dry Gauze 7. Secured with 3 Layer Compression System - Right Lower Extremity Electronic Signature(s) Signed: 04/21/2016 4:48:54 PM By: [name] [name], RN Entered By: [name] on 04/21/2016 08:43:27 Colasurdo, [name] ([id number]) -------------------------------------------------------------------------------- Vitals Details Bunkley, Date of Service: 04/21/2016 8:15 AM Patient Name: [name] Patient Account Number: [id number] Medical Record Treating RN: Baruch Gouty, RN, [name], Velva Harman [id number] Number: Other Clinician: Date of Birth/Sex: [birth year] (65 y.o. Male) Treating [name], MICHAEL Primary Care Physician/Extender: Tonye Pearson Physician: Referring Physician: Doristine Locks in Treatment: 15 Vital Signs Time Taken: 08:38 Temperature (F): 97.5 Height (in): 74 Pulse (bpm): 67 Weight (lbs): 240 Respiratory Rate (breaths/min): 18 Body Mass Index (BMI): 30.8 Blood Pressure (mmHg): 160/98 Reference Range: 80 - 120 mg / dl Electronic Signature(s) Signed: 04/21/2016 4:48:54 PM By: [name] [name], RN Entered By: [name] on 04/21/2016 08:38:49

## 2016-04-22 NOTE — Progress Notes (Signed)
DENYS, SALINGER ([id number]) Visit Report for 04/21/2016 Chief Complaint Document Details [place], Date of Service: 04/21/2016 8:15 AM Patient Name: [name] Patient Account Number: [id number] Medical Record Treating RN: Baruch Gouty, RN, [name], Velva Harman [id number] Number: Other Clinician: Date of Birth/Sex: 04/26/1951 (65 y.o. Male) Treating Dellia Nims, Marketta Valadez Primary Care Physician/Extender: Tonye Pearson Physician: Referring Physician: Doristine Locks in Treatment: 15 Information Obtained from: Patient Chief Complaint Mr [name] presents today for routine management on his RLE venous ulcer Electronic Signature(s) Signed: 04/21/2016 4:39:43 PM By: [name] MD Entered By: [name] on 04/21/2016 09:08:13 [name], [name] ([id number]) -------------------------------------------------------------------------------- HPI Details [name], Date of Service: 04/21/2016 8:15 AM Patient Name: [name] Patient Account Number: [id number] Medical Record Treating RN: Baruch Gouty RN, [name], Velva Harman [id number] Number: Other Clinician: Date of Birth/Sex: 1950-10-14 (65 y.o. Male) Treating Ardell Makarewicz Primary Care Physician/Extender: Tonye Pearson Physician: Referring Physician: Doristine Locks in Treatment: 15 History of Present Illness Location: open wound just above his right ankle medially Quality: Patient tells me he is not having a significant amount of pain at this point in time. Severity: 1 out of 10 Duration: This just reopened in the past week. Timing: Pain in wound is Intermittent Context: The wound appeared gradually over time Modifying Factors: Consults to this date include: he was seen in the ER and was referred to a vascular surgeon but the patient has not done that. He may have been treated with clindamycin in the ER. Associated Signs and Symptoms: Patient reports having difficulty standing for long periods. HPI Description: 65 year old gentleman who was seen in  the emergency department recently on 01/06/2015 for a wound of his right lower extremity which he says was not involving any injury and he did not know how he sustained it. He had draining foul-smelling liquid from the area and had gone for care there. his past medical history is significant for DVT, hypertension, gout, tobacco abuse, cocaine abuse, stroke, atrial fibrillation, pulmonary embolism. he has also had some vascular surgery with a stent placed in his leg. He has been a smoker for many years and has given up straight drugs several years ago. He continues to smoke about 4-5 cigarettes a day. 02/03/2015 -- received a note from 05/14/2013 where Dr. Leotis Pain placed an inferior vena cava filter. The patient had a deep vein thrombosis while therapeutic on anticoagulation for previous DVT and a IVC filter was placed for this. 02/10/2015 -- he did have his vascular test done on Friday but we have no reports yet. 02/17/2015 -- notes were reviewed from the vascular office and the patient had a venous ultrasound done which revealed that he had no reflux in the greater saphenous vein or the short saphenous vein bilaterally. He did have subacute DVT in the common femoral vein and popliteal veins on the right and left side. The recommendation was to continue with Unna's boot therapy at the wound clinic and then to wear graduated compression stockings once the ulcers healed and later if he had continuous problems lymphedema pump would benefit him. 03/17/2015 -- we have applied for his insurance and aide regarding cellular tissue-based products and are still awaiting the final clearance. 03/24/2015 -- he has had Apligraf authorized for him but his wound is looking so good today that we may not use it. 03/31/2015 -- he has not yet received his compression stockings though we have called a couple of times and hopefully they should arrive this week. Mark, [name]  ([id number]) READMISSION 01/06/16; this is a patient we have  previously cared for in this clinic with wounds on his right medial ankle. I was not previously involved with his care. 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 He wears compression stockings. He had reflux studies done during his last stay in this clinic which did not show significant reflux in the greater or lesser saphenous veins bilaterally. His history is that he developed a open sore on the left medial malleolus one week ago. He was seen in his primary physician office and given a course of doxycycline which he still should be on. Previously seen vascular surgery who felt that he had some degree of lymphedema as well. He is not a diabetic 01/13/16 no major change 01/20/16; very small wound on the medial right ankle again covered with surface slough that doesn't seem to be spotting the Prisma 01/27/16; patient comes in today complaining of a lot of pain around the wound site. He has not been systemically unwell. 02/03/16; the patient's wound culture last week grew Proteus, I had empirically given doxycycline. The Proteus was not specifically plated against doxycycline however Proteus itself was fairly pansensitive and the patient comes back feeling a lot better today. I think the doxycycline was likely to be successful in sufficient 02/10/16; as predicted last week the area has closed over. These are probably venous insufficiency wounds although his previous reflux studies did not show superficial reflux. He also has a history of DVT and at one time had a Greenfield filter in place. The area in question on his left medial ankle region. It became secondarily infected but responded nicely to antibiotics. He is closed today 02/17/16 unfortunately patient's venous wound on the medial aspect of his right ankle at this point in time has reopened.  He has been using some compression hose which appear to be very light that he purchased he tells me out of a magazine. He seems a little frustrated with the fact that this has reopened and is concerned about his left lower extremity possibly reopening as well. 02/25/16 patient presents today for follow-up evaluation regarding his right ankle wound. Currently he shows no interval signs or symptoms of infection. We have been compression wrapping him unfortunately the wraps that we had on him last week and he has a significant amount of swelling above whether this had slipped down to. He also notes that he's been having some burning as well at the wound site. He rates his discomfort at this point in time to be a 2-3 out of 10. Otherwise he has no other worsening symptoms. 03/03/16; this is a patient that had a wound on his left medial ankle that I discharged on 02/10/16. He apparently reappeared the next week with open areas on his right medial ankle. Her intake nurse reports today that he has a lot of drainage and odor at intake even after the wound was cleaned. Also of note the patient complains of edema in the left leg and showed up with only one of the 2 layer compression system. 03/05/2016 -- since his visit 2 days ago to see Dr. Dellia Nims he complained of significant pain in his right lower extremity which was much more than he's ever had before. He came in for an urgent visit to review his condition. He has been placed on doxycycline empirically and his culture reports were reviewed but the final result is not back. 03/10/16;  patient was in last week to see Dr. Con Memos with increasing pain in his leg. He was reduced to a 3 layer compression from 4 which seems to have helped overall. Culture from last week grew again pansensitive Proteus, this should've been sensitive to the doxycycline I gave him and he is finishing that today. The patient is had previous arterial and venous review by vascular  surgery. Patient is currently using Aquacel Ag under a 3 layer compression. 03/17/16; patient's wound dimensions are down this week. He has been using silver alginate 03/24/2016 - Mr. Bair arrives today for management of RLE venous ulcer. The alginate dressing is Traynham, Eeshan ([id number]) densly adhered to the ulcer. He offers no complaints, concerns, or needs. 03/31/16; no real change in the wound measurements post debridement. Using Prisma. If anything the measurements are larger today at 2 x 1 cm post debridement 04-07-16 Mr. Tomei arrives today for management of his right lower extremity venous ulcer. He is voicing no complaints associated with his wound over the last week. He does inquire about need for compression therapy, this appears to be a weekly inquiry. He was advised that compression therapy is indicated throughout the treatment of the wound and he will then transition to compression stockings. He is compliant with compression stockings to the left lower extremity. 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 The base of the wound is healthy we're using Hydrofera Blue. Measurements are smaller 04/21/16; patient has severe chronic venous insufficiency on the right medial lower leg. He is here with a venous insufficiency ulcer in that location. He continues to make progress in terms of wound area. Surface of the wound also appears to have very healthy granulation we have been using Hydrofera Blue and there seems to be very little reason to change Electronic Signature(s) Signed: 04/21/2016 4:39:43 PM By: [name] MD Entered By: [name] on 04/21/2016 09:09:33 Birdsall, [name] ([id number]) -------------------------------------------------------------------------------- Physical Exam Details [place], Date of Service: 04/21/2016 8:15 AM Patient Name: [name] Patient Account Number: [id number] Medical Record  Treating RN: Baruch Gouty RN, [name], Velva Harman [id number] Number: Other Clinician: Date of Birth/Sex: 1951-03-09 (65 y.o. Male) Treating [name] Primary Care Physician/Extender: Tonye Pearson Physician: Referring Physician: Doristine Locks in Treatment: 36 Constitutional Patient is hypertensive.. Pulse regular and within target range for patient.[name] Respirations regular, non-labored and within target range.. Temperature is normal and within the target range for the patient.. Patient's appearance is neat and clean. Appears in no acute distress. Well nourished and well developed.[name] Respiratory Respiratory effort is easy and symmetric bilaterally. Rate is normal at rest and on room air.. Cardiovascular Pedal pulses palpable and strong bilaterally.. Severe changes of chronic venous insufficiency with extensive hemosiderin deposition. Nevertheless the edema and inflammation seem controlled.. Lymphatic None palpable in the popliteal or inguinal area. Psychiatric No evidence of depression, anxiety, or agitation. Calm, cooperative, and communicative. Appropriate interactions and affect.. Notes Wound exam; the wound continues to measure smaller and the base of this appears to continue to have healthy-looking granulation. No debridement is required. He has some thick edges relatively around the wound however I continue to monitor this. As long as the wound is contracting there is no reason to debridement this Electronic Signature(s) Signed: 04/21/2016 4:39:43 PM By: [name] MD Entered By: [name] on 04/21/2016 09:11:29 Fors, [name] ([id number]) -------------------------------------------------------------------------------- Physician Orders Details Zea, Date of Service: 04/21/2016 8:15 AM Patient Name: [name] Patient Account Number: [id number] Medical Record Treating RN: Baruch Gouty, RN,  [name], Velva Harman [id number] Number: Other Clinician: Date of Birth/Sex: 1950-07-13 (65  y.o. Male) Treating Layce Sprung Primary Care Physician/Extender: Tonye Pearson Physician: Referring Physician: Doristine Locks in Treatment: 15 Verbal / Phone Orders: Yes Clinician: Afful, RN, [name], Rita Read Back and Verified: Yes Diagnosis Coding Wound Cleansing Wound #3 Right Lower Leg o Clean wound with Normal Saline. o Cleanse wound with mild soap and water - at clinic Anesthetic Wound #3 Right Lower Leg o Topical Lidocaine 4% cream applied to wound bed prior to debridement Skin Barriers/Peri-Wound Care Wound #3 Right Lower Leg o Barrier cream Primary Wound Dressing Wound #3 Right Lower Leg o Hydrafera Blue Secondary Dressing Wound #3 Right Lower Leg o ABD pad o Dry Gauze Dressing Change Frequency Wound #3 Right Lower Leg o Change dressing every week Follow-up Appointments Wound #3 Right Lower Leg o Return Appointment in 1 week. Edema Control Wound #3 Right Lower Leg Skiver, Mcihael ([id number]) o 3 Layer Compression System - Right Lower Extremity - unna to anchor Additional Orders / Instructions Wound #3 Right Lower Leg o Increase protein intake. Electronic Signature(s) Signed: 04/21/2016 4:39:43 PM By: [name] MD Signed: 04/21/2016 4:48:54 PM By: [name] [name], RN Entered By: [name] on 04/21/2016 08:55:49 Sturgill, [name] ([id number]) -------------------------------------------------------------------------------- Problem List Details [place], Date of Service: 04/21/2016 8:15 AM Patient Name: [facility] Patient Account Number: [id number] Medical Record Treating RN: Baruch Gouty RN, [name], Velva Harman [id number] Number: Other Clinician: Date of Birth/Sex: 22-Oct-1950 (65 y.o. Male) Treating [name] Primary Care Physician/Extender: Tonye Pearson Physician: Referring Physician: Doristine Locks in Treatment: 15 Active Problems ICD-10 Encounter Code Description Active Date Diagnosis L97.311 Non-pressure  chronic ulcer of right ankle limited to 03/03/2016 Yes breakdown of skin I87.331 Chronic venous hypertension (idiopathic) with ulcer and 03/03/2016 Yes inflammation of right lower extremity Inactive Problems Resolved Problems ICD-10 Code Description Active Date Resolved Date L97.321 Non-pressure chronic ulcer of left ankle limited to 01/06/2016 01/06/2016 breakdown of skin I87.332 Chronic venous hypertension (idiopathic) with ulcer and 01/06/2016 01/06/2016 inflammation of left lower extremity Electronic Signature(s) Signed: 04/21/2016 4:39:43 PM By: [name] MD Entered By: [name] on 04/21/2016 09:07:56 Mciver, [name] ([id number]) -------------------------------------------------------------------------------- Progress Note Details [place], Date of Service: 04/21/2016 8:15 AM Patient Name: [name] Patient Account Number: [id number] Medical Record Treating RN: Baruch Gouty, RN, [name], Velva Harman [id number] Number: Other Clinician: Date of Birth/Sex: 01/11/1951 (65 y.o. Male) Treating Ammie Warrick Primary Care Physician/Extender: Tonye Pearson Physician: Referring Physician: Doristine Locks in Treatment: 15 Subjective Chief Complaint Information obtained from Patient Mr Platts presents today for routine management on his RLE venous ulcer History of Present Illness (HPI) The following HPI elements were documented for the patient's wound: Location: open wound just above his right ankle medially Quality: Patient tells me he is not having a significant amount of pain at this point in time. Severity: 1 out of 10 Duration: This just reopened in the past week. Timing: Pain in wound is Intermittent Context: The wound appeared gradually over time Modifying Factors: Consults to this date include: he was seen in the ER and was referred to a vascular surgeon but the patient has not done that. He may have been treated with clindamycin in the ER. Associated Signs and  Symptoms: Patient reports having difficulty standing for long periods. 65 year old gentleman who was seen in the emergency department recently on 01/06/2015 for a wound of his right lower extremity which he says was not involving any injury and he did not know how he sustained it.  He had draining foul-smelling liquid from the area and had gone for care there. his past medical history is significant for DVT, hypertension, gout, tobacco abuse, cocaine abuse, stroke, atrial fibrillation, pulmonary embolism. he has also had some vascular surgery with a stent placed in his leg. He has been a smoker for many years and has given up straight drugs several years ago. He continues to smoke about 4-5 cigarettes a day. 02/03/2015 -- received a note from 05/14/2013 where Dr. Leotis Pain placed an inferior vena cava filter. The patient had a deep vein thrombosis while therapeutic on anticoagulation for previous DVT and a IVC filter was placed for this. 02/10/2015 -- he did have his vascular test done on Friday but we have no reports yet. 02/17/2015 -- notes were reviewed from the vascular office and the patient had a venous ultrasound done which revealed that he had no reflux in the greater saphenous vein or the short saphenous vein bilaterally. He did have subacute DVT in the common femoral vein and popliteal veins on the right and left side. The recommendation was to continue with Unna's boot therapy at the wound clinic and then to wear graduated compression stockings once the ulcers healed and later if he had continuous problems lymphedema pump would benefit him. JAMARL, PEW ([id number]) 03/17/2015 -- we have applied for his insurance and aide regarding cellular tissue-based products and are still awaiting the final clearance. 03/24/2015 -- he has had Apligraf authorized for him but his wound is looking so good today that we may not use it. 03/31/2015 -- he has not yet received his compression  stockings though we have called a couple of times and hopefully they should arrive this week. READMISSION 01/06/16; this is a patient we have previously cared for in this clinic with wounds on his right medial ankle. I was not previously involved with his care. 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 He wears compression stockings. He had reflux studies done during his last stay in this clinic which did not show significant reflux in the greater or lesser saphenous veins bilaterally. His history is that he developed a open sore on the left medial malleolus one week ago. He was seen in his primary physician office and given a course of doxycycline which he still should be on. Previously seen vascular surgery who felt that he had some degree of lymphedema as well. He is not a diabetic 01/13/16 no major change 01/20/16; very small wound on the medial right ankle again covered with surface slough that doesn't seem to be spotting the Prisma 01/27/16; patient comes in today complaining of a lot of pain around the wound site. He has not been systemically unwell. 02/03/16; the patient's wound culture last week grew Proteus, I had empirically given doxycycline. The Proteus was not specifically plated against doxycycline however Proteus itself was fairly pansensitive and the patient comes back feeling a lot better today. I think the doxycycline was likely to be successful in sufficient 02/10/16; as predicted last week the area has closed over. These are probably venous insufficiency wounds although his previous reflux studies did not show superficial reflux. He also has a history of DVT and at one time had a Greenfield filter in place. The area in question on his left medial ankle region. It became secondarily infected but responded nicely to antibiotics. He is closed today 02/17/16  unfortunately patient's venous  wound on the medial aspect of his right ankle at this point in time has reopened. He has been using some compression hose which appear to be very light that he purchased he tells me out of a magazine. He seems a little frustrated with the fact that this has reopened and is concerned about his left lower extremity possibly reopening as well. 02/25/16 patient presents today for follow-up evaluation regarding his right ankle wound. Currently he shows no interval signs or symptoms of infection. We have been compression wrapping him unfortunately the wraps that we had on him last week and he has a significant amount of swelling above whether this had slipped down to. He also notes that he's been having some burning as well at the wound site. He rates his discomfort at this point in time to be a 2-3 out of 10. Otherwise he has no other worsening symptoms. 03/03/16; this is a patient that had a wound on his left medial ankle that I discharged on 02/10/16. He apparently reappeared the next week with open areas on his right medial ankle. Her intake nurse reports today that he has a lot of drainage and odor at intake even after the wound was cleaned. Also of note the patient complains of edema in the left leg and showed up with only one of the 2 layer compression system. 03/05/2016 -- since his visit 2 days ago to see Dr. Dellia Nims he complained of significant pain in his right lower extremity which was much more than he's ever had before. He came in for an urgent visit to review his condition. He has been placed on doxycycline empirically and his culture reports were reviewed but the final result is not back. 03/10/16; patient was in last week to see Dr. Con Memos with increasing pain in his leg. He was reduced to a 3 Beagley, Mehar ([id number]) layer compression from 4 which seems to have helped overall. Culture from last week grew again pansensitive Proteus, this should've been sensitive to the doxycycline  I gave him and he is finishing that today. The patient is had previous arterial and venous review by vascular surgery. Patient is currently using Aquacel Ag under a 3 layer compression. 03/17/16; patient's wound dimensions are down this week. He has been using silver alginate 03/24/2016 - Mr. Pint arrives today for management of RLE venous ulcer. The alginate dressing is densly adhered to the ulcer. He offers no complaints, concerns, or needs. 03/31/16; no real change in the wound measurements post debridement. Using Prisma. If anything the measurements are larger today at 2 x 1 cm post debridement 04-07-16 Mr. Tomei arrives today for management of his right lower extremity venous ulcer. He is voicing no complaints associated with his wound over the last week. He does inquire about need for compression therapy, this appears to be a weekly inquiry. He was advised that compression therapy is indicated throughout the treatment of the wound and he will then transition to compression stockings. He is compliant with compression stockings to the left lower extremity. 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 The base of the wound is healthy we're using Hydrofera Blue. Measurements are smaller 04/21/16; patient has severe chronic venous insufficiency on the right medial lower leg. He is here with a venous insufficiency ulcer in that location. He continues to make progress in terms of wound area. Surface of the wound also appears to have very healthy granulation we have  been using Hydrofera Blue and there seems to be very little reason to change Objective Constitutional Patient is hypertensive.. Pulse regular and within target range for patient.[name] Respirations regular, non-labored and within target range.. Temperature is normal and within the target range for the patient.. Patient's appearance is neat and clean. Appears in no acute distress. Well  nourished and well developed.. Vitals Time Taken: 8:38 AM, Height: 74 in, Weight: 240 lbs, BMI: 30.8, Temperature: 97.5 F, Pulse: 67 bpm, Respiratory Rate: 18 breaths/min, Blood Pressure: 160/98 mmHg. Respiratory Respiratory effort is easy and symmetric bilaterally. Rate is normal at rest and on room air.. Cardiovascular Pedal pulses palpable and strong bilaterally.. Severe changes of chronic venous insufficiency with extensive hemosiderin deposition. Nevertheless the edema and inflammation seem controlled.. Lymphatic None palpable in the popliteal or inguinal area. Psychiatric Schimming, [name] ([id number]) No evidence of depression, anxiety, or agitation. Calm, cooperative, and communicative. Appropriate interactions and affect.. General Notes: Wound exam; the wound continues to measure smaller and the base of this appears to continue to have healthy-looking granulation. No debridement is required. He has some thick edges relatively around the wound however I continue to monitor this. As long as the wound is contracting there is no reason to debridement this Integumentary (Hair, Skin) Wound #3 status is Open. Original cause of wound was Gradually Appeared. The wound is located on the Right Lower Leg. The wound measures 1.4cm length x 0.7cm width x 0.1cm depth; 0.77cm^2 area and 0.077cm^3 volume. The wound is limited to skin breakdown. There is no tunneling or undermining noted. There is a medium amount of serosanguineous drainage noted. The wound margin is flat and intact. There is medium (34-66%) red, pink, pale granulation within the wound bed. There is no necrotic tissue within the wound bed. The periwound skin appearance exhibited: Induration, Scarring, Moist, Hemosiderin Staining, Mottled. The periwound skin appearance did not exhibit: Callus, Crepitus, Excoriation, Fluctuance, Friable, Localized Edema, Rash, Dry/Scaly, Maceration, Atrophie Blanche, Cyanosis, Ecchymosis, Pallor,  Rubor, Erythema. Periwound temperature was noted as No Abnormality. 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 Cleanse wound with mild soap and water - at clinic Anesthetic: Wound #3 Right Lower Leg: Topical Lidocaine 4% cream applied to wound bed prior to debridement Skin Barriers/Peri-Wound Care: Wound #3 Right Lower Leg: Barrier cream Primary Wound Dressing: Wound #3 Right Lower Leg: Oo, Kamen ([id number]) Hydrafera Blue Secondary Dressing: Wound #3 Right Lower Leg: ABD pad Dry Gauze Dressing Change Frequency: Wound #3 Right Lower Leg: Change dressing every week Follow-up Appointments: Wound #3 Right Lower Leg: Return Appointment in 1 week. Edema Control: Wound #3 Right Lower Leg: 3 Layer Compression System - Right Lower Extremity - unna to anchor Additional Orders / Instructions: Wound #3 Right Lower Leg: Increase protein intake. Continue hydrafera blue/ABD/3layer. His edema seems well controlled as is the venous inflammation around the periwound 3 layer compression seems adequate he has compression stockings in preparation for a healed state Electronic Signature(s) Signed: 04/21/2016 4:39:43 PM By: [name] MD Entered By: [name] on 04/21/2016 09:13:47 Fury, [name] ([id number]) -------------------------------------------------------------------------------- SuperBill Details Consuegra, Date of Service: 04/21/2016 Patient Name: [facility] Patient Account Number: [id number] Medical Record Treating RN: Baruch Gouty RN, [name], Velva Harman [id number] Number: Other Clinician: Date of Birth/Sex: October 17, 1950 (65 y.o. Male) Treating Clennon Nasca Primary Care Physician/Extender: Collie Siad,  ADRIAN Physician: Suella Grove in Treatment: 15 Referring Physician: Royetta Crochet Diagnosis Coding ICD-10 Codes Code Description T03.546 Non-pressure chronic ulcer of right ankle limited to breakdown of skin Chronic venous hypertension (idiopathic) with ulcer and inflammation of right lower I87.331 extremity Facility Procedures CPT4: Description Modifier Quantity Code [id number] (Facility Use Only) [phone] - APPLY [place] RT 1 LEG Physician Procedures CPT4: Description Modifier Quantity Code [id number] - WC PHYS LEVEL 3 - EST PT 1 ICD-10 Description Diagnosis L97.311 Non-pressure chronic ulcer of right ankle limited to breakdown of skin I87.331 Chronic venous hypertension (idiopathic) with ulcer  and inflammation of right lower extremity Electronic Signature(s) Signed: 04/21/2016 4:39:43 PM By: [name] MD Entered By: [name] on 04/21/2016 09:14:17

## 2016-04-28 ENCOUNTER — Encounter: Payer: Medicare Other | Admitting: Internal Medicine

## 2016-04-28 DIAGNOSIS — I87331 Chronic venous hypertension (idiopathic) with ulcer and inflammation of right lower extremity: Secondary | ICD-10-CM | POA: Diagnosis not present

## 2016-04-29 NOTE — Progress Notes (Signed)
ROHIT, DELORIA ([id number]) Visit Report for 04/28/2016 Chief Complaint Document Details [place], Date of Service: 04/28/2016 8:15 AM Patient Name: [facility] Patient Account Number: [id number] Medical Record Treating RN: Baruch Gouty, RN, [name], Velva Harman [id number] Number: Other Clinician: Date of Birth/Sex: 07/13/1950 (65 y.o. Male) Treating Dellia Nims, MICHAEL Primary Care Physician/Extender: Tonye Pearson Physician: Referring Physician: Doristine Locks in Treatment: 16 Information Obtained from: Patient Chief Complaint Mr Mcghee presents today for routine management on his RLE venous ulcer Electronic Signature(s) Signed: 04/28/2016 4:39:00 PM By: [name] MD Entered By: [name] on 04/28/2016 09:12:56 Blasdel, [name] ([id number]) -------------------------------------------------------------------------------- Debridement Details [place], Date of Service: 04/28/2016 8:15 AM Patient Name: [name] Patient Account Number: [id number] Medical Record Treating RN: Baruch Gouty, RN, [name], Velva Harman [id number] Number: Other Clinician: Date of Birth/Sex: Nov 15, 1950 (65 y.o. Male) Treating [name], MICHAEL Primary Care Physician/Extender: Tonye Pearson Physician: Referring Physician: Doristine Locks in Treatment: 16 Debridement Performed for Wound #3 Right Lower Leg Assessment: Performed By: Physician Ricard Dillon, MD Debridement: Debridement Pre-procedure Yes - 09:07 Verification/Time Out Taken: Start Time: 09:07 Pain Control: Lidocaine 4% Topical Solution Level: Skin/Subcutaneous Tissue Total Area Debrided (L x 1.4 (cm) x 0.5 (cm) = 0.7 (cm) W): Tissue and other Non-Viable, Fibrin/Slough, Subcutaneous material debrided: Instrument: Curette Bleeding: Minimum Hemostasis Achieved: Pressure End Time: 09:08 Procedural Pain: 0 Post Procedural Pain: 0 Response to Treatment: Procedure was tolerated well Post Debridement Measurements of Total Wound Length:  (cm) 1.4 Width: (cm) 0.5 Depth: (cm) 0.1 Volume: (cm) 0.055 Character of Wound/Ulcer Post Stable Debridement: Severity of Tissue Post Debridement: Fat layer exposed Post Procedure Diagnosis Same as Pre-procedure Electronic Signature(s) Signed: 04/28/2016 4:39:00 PM By: [name] MD Rennels, [name] ([id number]) Signed: 04/28/2016 5:11:17 PM By: [name] [name], RN Entered By: [name] on 04/28/2016 09:12:28 Macdowell, [name] ([id number]) -------------------------------------------------------------------------------- HPI Details [place], Date of Service: 04/28/2016 8:15 AM Patient Name: [facility] Patient Account Number: [id number] Medical Record Treating RN: Baruch Gouty RN, [name], Velva Harman [id number] Number: Other Clinician: Date of Birth/Sex: 01/04/1951 (65 y.o. Male) Treating [name], MICHAEL Primary Care Physician/Extender: Tonye Pearson Physician: Referring Physician: Doristine Locks in Treatment: 16 History of Present Illness Location: open wound just above his right ankle medially Quality: Patient tells me he is not having a significant amount of pain at this point in time. Severity: 1 out of 10 Duration: This just reopened in the past week. Timing: Pain in wound is Intermittent Context: The wound appeared gradually over time Modifying Factors: Consults to this date include: he was seen in the ER and was referred to a vascular surgeon but the patient has not done that. He may have been treated with clindamycin in the ER. Associated Signs and Symptoms: Patient reports having difficulty standing for long periods. HPI Description: 65 year old gentleman who was seen in the emergency department recently on 01/06/2015 for a wound of his right lower extremity which he says was not involving any injury and he did not know how he sustained it. He had draining foul-smelling liquid from the area and had gone for care there. his past medical history is significant  for DVT, hypertension, gout, tobacco abuse, cocaine abuse, stroke, atrial fibrillation, pulmonary embolism. he has also had some vascular surgery with a stent placed in his leg. He has been a smoker for many years and has given up straight drugs several years ago. He continues to smoke about 4-5 cigarettes a day. 02/03/2015 -- received a note from 05/14/2013 where Dr. Leotis Pain placed an inferior vena cava filter. The patient  had a deep vein thrombosis while therapeutic on anticoagulation for previous DVT and a IVC filter was placed for this. 02/10/2015 -- he did have his vascular test done on Friday but we have no reports yet. 02/17/2015 -- notes were reviewed from the vascular office and the patient had a venous ultrasound done which revealed that he had no reflux in the greater saphenous vein or the short saphenous vein bilaterally. He did have subacute DVT in the common femoral vein and popliteal veins on the right and left side. The recommendation was to continue with Unna's boot therapy at the wound clinic and then to wear graduated compression stockings once the ulcers healed and later if he had continuous problems lymphedema pump would benefit him. 03/17/2015 -- we have applied for his insurance and aide regarding cellular tissue-based products and are still awaiting the final clearance. 03/24/2015 -- he has had Apligraf authorized for him but his wound is looking so good today that we may not use it. 03/31/2015 -- he has not yet received his compression stockings though we have called a couple of times and hopefully they should arrive this week. NIKOLIS, [name] ([id number]) [place] 01/06/16; this is a patient we have previously cared for in this clinic with wounds on his right medial ankle. I was not previously involved with his care. 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 He  wears compression stockings. He had reflux studies done during his last stay in this clinic which did not show significant reflux in the greater or lesser saphenous veins bilaterally. His history is that he developed a open sore on the left medial malleolus one week ago. He was seen in his primary physician office and given a course of doxycycline which he still should be on. Previously seen vascular surgery who felt that he had some degree of lymphedema as well. He is not a diabetic 01/13/16 no major change 01/20/16; very small wound on the medial right ankle again covered with surface slough that doesn't seem to be spotting the Prisma 01/27/16; patient comes in today complaining of a lot of pain around the wound site. He has not been systemically unwell. 02/03/16; the patient's wound culture last week grew Proteus, I had empirically given doxycycline. The Proteus was not specifically plated against doxycycline however Proteus itself was fairly pansensitive and the patient comes back feeling a lot better today. I think the doxycycline was likely to be successful in sufficient 02/10/16; as predicted last week the area has closed over. These are probably venous insufficiency wounds although his previous reflux studies did not show superficial reflux. He also has a history of DVT and at one time had a Greenfield filter in place. The area in question on his left medial ankle region. It became secondarily infected but responded nicely to antibiotics. He is closed today 02/17/16 unfortunately patient's venous wound on the medial aspect of his right ankle at this point in time has reopened. He has been using some compression hose which appear to be very light that he purchased he tells me out of a magazine. He seems a little frustrated with the fact that this has reopened and is concerned about his left lower extremity possibly reopening as well. 02/25/16 patient presents today for follow-up evaluation  regarding his right ankle wound. Currently he shows no interval signs or symptoms of infection. We have been  compression wrapping him unfortunately the wraps that we had on him last week and he has a significant amount of swelling above whether this had slipped down to. He also notes that he's been having some burning as well at the wound site. He rates his discomfort at this point in time to be a 2-3 out of 10. Otherwise he has no other worsening symptoms. 03/03/16; this is a patient that had a wound on his left medial ankle that I discharged on 02/10/16. He apparently reappeared the next week with open areas on his right medial ankle. Her intake nurse reports today that he has a lot of drainage and odor at intake even after the wound was cleaned. Also of note the patient complains of edema in the left leg and showed up with only one of the 2 layer compression system. 03/05/2016 -- since his visit 2 days ago to see Dr. Dellia Nims he complained of significant pain in his right lower extremity which was much more than he's ever had before. He came in for an urgent visit to review his condition. He has been placed on doxycycline empirically and his culture reports were reviewed but the final result is not back. 03/10/16; patient was in last week to see Dr. Con Memos with increasing pain in his leg. He was reduced to a 3 layer compression from 4 which seems to have helped overall. Culture from last week grew again pansensitive Proteus, this should've been sensitive to the doxycycline I gave him and he is finishing that today. The patient is had previous arterial and venous review by vascular surgery. Patient is currently using Aquacel Ag under a 3 layer compression. 03/17/16; patient's wound dimensions are down this week. He has been using silver alginate 03/24/2016 - Mr. Hickle arrives today for management of RLE venous ulcer. The alginate dressing is Forand, Curby ([id number]) densly adhered to  the ulcer. He offers no complaints, concerns, or needs. 03/31/16; no real change in the wound measurements post debridement. Using Prisma. If anything the measurements are larger today at 2 x 1 cm post debridement 04-07-16 Mr. Tomei arrives today for management of his right lower extremity venous ulcer. He is voicing no complaints associated with his wound over the last week. He does inquire about need for compression therapy, this appears to be a weekly inquiry. He was advised that compression therapy is indicated throughout the treatment of the wound and he will then transition to compression stockings. He is compliant with compression stockings to the left lower extremity. 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 The base of the wound is healthy we're using Hydrofera Blue. Measurements are smaller 04/21/16; patient has severe chronic venous insufficiency on the right medial lower leg. He is here with a venous insufficiency ulcer in that location. He continues to make progress in terms of wound area. Surface of the wound also appears to have very healthy granulation we have been using Hydrofera Blue and there seems to be very little reason to change. 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 He has an ulcer in his right medial lower leg. We have been making very gradual progress here using Hydrofera Blue for the last several weeks Electronic Signature(s) Signed: 04/28/2016 4:39:00 PM By: [name] MD Entered By: [name] on 04/28/2016 09:14:07 Hehir, [name] ([id number]) -------------------------------------------------------------------------------- Physical Exam Details [place], Date of Service: 04/28/2016 8:15 AM Patient Name: [name] Patient Account Number: [id number] Medical Record Treating RN:  Afful, RN, [name], Velva Harman [id number] Number: Other Clinician: Date of  Birth/Sex: [birth year] (65 y.o. Male) Treating [name], MICHAEL Primary Care Physician/Extender: Tonye Pearson Physician: Referring Physician: Doristine Locks in Treatment: 59 Constitutional Patient is hypertensive.. Pulse regular and within target range for patient.[name] Respirations regular, non-labored and within target range.. Temperature is normal and within the target range for the patient.. Patient's appearance is neat and clean. Appears in no acute distress. Well nourished and well developed.. Eyes Conjunctivae clear. No discharge.. Notes Wound exam; visibly the surface of this looks reasonably stable although we have not been making much progress in terms of reduction a wound area. His peripheral pulses are vibrant. There is no evidence of infection. Using #3 curet the surface of the wound was debrided to remove surface slough and nonviable tissue. Even doing this there didn't seem to be a lot of evidence of a nonvisualized surface/bioburden Electronic Signature(s) Signed: 04/28/2016 4:39:00 PM By: [name] MD Entered By: [name] on 04/28/2016 09:15:55 Skop, [name] ([id number]) -------------------------------------------------------------------------------- Physician Orders Details [place], Date of Service: 04/28/2016 8:15 AM Patient Name: [name] Patient Account Number: [id number] Medical Record Treating RN: Baruch Gouty RN, [name], Velva Harman [id number] Number: Other Clinician: Date of Birth/Sex: November 02, 1950 (65 y.o. Male) Treating [name], MICHAEL Primary Care Physician/Extender: Tonye Pearson Physician: Referring Physician: Doristine Locks in Treatment: 41 Verbal / Phone Orders: Yes Clinician: Afful, RN, [name], Rita Read Back and Verified: Yes Diagnosis Coding Wound Cleansing Wound #3 Right Lower Leg o Clean wound with Normal Saline. o Cleanse wound with mild soap and water - at clinic Anesthetic Wound #3 Right Lower Leg o Topical Lidocaine 4%  cream applied to wound bed prior to debridement Skin Barriers/Peri-Wound Care Wound #3 Right Lower Leg o Barrier cream Primary Wound Dressing Wound #3 Right Lower Leg o Prisma Ag Secondary Dressing Wound #3 Right Lower Leg o ABD pad o Dry Gauze Dressing Change Frequency Wound #3 Right Lower Leg o Change dressing every week Follow-up Appointments Wound #3 Right Lower Leg o Return Appointment in 1 week. Edema Control Wound #3 Right Lower Leg Desilets, Katherine ([id number]) o 3 Layer Compression System - Right Lower Extremity - unna to anchor Additional Orders / Instructions Wound #3 Right Lower Leg o Increase protein intake. Electronic Signature(s) Signed: 04/28/2016 4:39:00 PM By: [name] MD Signed: 04/28/2016 5:11:17 PM By: [name] [name], RN Entered By: [name] on 04/28/2016 09:09:06 Cendejas, [name] ([id number]) -------------------------------------------------------------------------------- Problem List Details [place], Date of Service: 04/28/2016 8:15 AM Patient Name: [facility] Patient Account Number: [id number] Medical Record Treating RN: Baruch Gouty RN, [name], Velva Harman [id number] Number: Other Clinician: Date of Birth/Sex: April 29, 1951 (65 y.o. Male) Treating [name] Primary Care Physician/Extender: Tonye Pearson Physician: Referring Physician: Doristine Locks in Treatment: 16 Active Problems ICD-10 Encounter Code Description Active Date Diagnosis L97.311 Non-pressure chronic ulcer of right ankle limited to 03/03/2016 Yes breakdown of skin I87.331 Chronic venous hypertension (idiopathic) with ulcer and 03/03/2016 Yes inflammation of right lower extremity Inactive Problems Resolved Problems ICD-10 Code Description Active Date Resolved Date L97.321 Non-pressure chronic ulcer of left ankle limited to 01/06/2016 01/06/2016 breakdown of skin I87.332 Chronic venous hypertension (idiopathic) with ulcer and 01/06/2016  01/06/2016 inflammation of left lower extremity Electronic Signature(s) Signed: 04/28/2016 4:39:00 PM By: [name] MD Entered By: [name] on 04/28/2016 09:09:39 Pettinger, [name] ([id number]) -------------------------------------------------------------------------------- Progress Note Details [place], Date of Service: 04/28/2016 8:15 AM Patient Name: [name] Patient Account Number: [id number] Medical Record Treating RN: Baruch Gouty RN, [name], Velva Harman [id number] Number: Other Clinician:  Date of Birth/Sex: 02/04/1951 (65 y.o. Male) Treating [name], MICHAEL Primary Care Physician/Extender: Tonye Pearson Physician: Referring Physician: Doristine Locks in Treatment: 16 Subjective Chief Complaint Information obtained from Patient Mr Wildes presents today for routine management on his RLE venous ulcer History of Present Illness (HPI) The following HPI elements were documented for the patient's wound: Location: open wound just above his right ankle medially Quality: Patient tells me he is not having a significant amount of pain at this point in time. Severity: 1 out of 10 Duration: This just reopened in the past week. Timing: Pain in wound is Intermittent Context: The wound appeared gradually over time Modifying Factors: Consults to this date include: he was seen in the ER and was referred to a vascular surgeon but the patient has not done that. He may have been treated with clindamycin in the ER. Associated Signs and Symptoms: Patient reports having difficulty standing for long periods. 65 year old gentleman who was seen in the emergency department recently on 01/06/2015 for a wound of his right lower extremity which he says was not involving any injury and he did not know how he sustained it. He had draining foul-smelling liquid from the area and had gone for care there. his past medical history is significant for DVT, hypertension, gout, tobacco abuse, cocaine  abuse, stroke, atrial fibrillation, pulmonary embolism. he has also had some vascular surgery with a stent placed in his leg. He has been a smoker for many years and has given up straight drugs several years ago. He continues to smoke about 4-5 cigarettes a day. 02/03/2015 -- received a note from 05/14/2013 where Dr. Leotis Pain placed an inferior vena cava filter. The patient had a deep vein thrombosis while therapeutic on anticoagulation for previous DVT and a IVC filter was placed for this. 02/10/2015 -- he did have his vascular test done on Friday but we have no reports yet. 02/17/2015 -- notes were reviewed from the vascular office and the patient had a venous ultrasound done which revealed that he had no reflux in the greater saphenous vein or the short saphenous vein bilaterally. He did have subacute DVT in the common femoral vein and popliteal veins on the right and left side. The recommendation was to continue with Unna's boot therapy at the wound clinic and then to wear graduated compression stockings once the ulcers healed and later if he had continuous problems lymphedema pump would benefit him. JAMEIS, [name] ([id number]) 03/17/2015 -- we have applied for his insurance and aide regarding cellular tissue-based products and are still awaiting the final clearance. 03/24/2015 -- he has had Apligraf authorized for him but his wound is looking so good today that we may not use it. 03/31/2015 -- he has not yet received his compression stockings though we have called a couple of times and hopefully they should arrive this week. READMISSION 01/06/16; this is a patient we have previously cared for in this clinic with wounds on his right medial ankle. I was not previously involved with his care. 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 He wears compression stockings. He had reflux studies  done during his last stay in this clinic which did not show significant reflux in the greater or lesser saphenous veins bilaterally. His history is that he developed a open sore on the left medial malleolus one week ago. He was seen in  his primary physician office and given a course of doxycycline which he still should be on. Previously seen vascular surgery who felt that he had some degree of lymphedema as well. He is not a diabetic 01/13/16 no major change 01/20/16; very small wound on the medial right ankle again covered with surface slough that doesn't seem to be spotting the Prisma 01/27/16; patient comes in today complaining of a lot of pain around the wound site. He has not been systemically unwell. 02/03/16; the patient's wound culture last week grew Proteus, I had empirically given doxycycline. The Proteus was not specifically plated against doxycycline however Proteus itself was fairly pansensitive and the patient comes back feeling a lot better today. I think the doxycycline was likely to be successful in sufficient 02/10/16; as predicted last week the area has closed over. These are probably venous insufficiency wounds although his previous reflux studies did not show superficial reflux. He also has a history of DVT and at one time had a Greenfield filter in place. The area in question on his left medial ankle region. It became secondarily infected but responded nicely to antibiotics. He is closed today 02/17/16 unfortunately patient's venous wound on the medial aspect of his right ankle at this point in time has reopened. He has been using some compression hose which appear to be very light that he purchased he tells me out of a magazine. He seems a little frustrated with the fact that this has reopened and is concerned about his left lower extremity possibly reopening as well. 02/25/16 patient presents today for follow-up evaluation regarding his right ankle wound. Currently he shows no  interval signs or symptoms of infection. We have been compression wrapping him unfortunately the wraps that we had on him last week and he has a significant amount of swelling above whether this had slipped down to. He also notes that he's been having some burning as well at the wound site. He rates his discomfort at this point in time to be a 2-3 out of 10. Otherwise he has no other worsening symptoms. 03/03/16; this is a patient that had a wound on his left medial ankle that I discharged on 02/10/16. He apparently reappeared the next week with open areas on his right medial ankle. Her intake nurse reports today that he has a lot of drainage and odor at intake even after the wound was cleaned. Also of note the patient complains of edema in the left leg and showed up with only one of the 2 layer compression system. 03/05/2016 -- since his visit 2 days ago to see Dr. Dellia Nims he complained of significant pain in his right lower extremity which was much more than he's ever had before. He came in for an urgent visit to review his condition. He has been placed on doxycycline empirically and his culture reports were reviewed but the final result is not back. 03/10/16; patient was in last week to see Dr. Con Memos with increasing pain in his leg. He was reduced to a 3 Storey, Horacio ([id number]) layer compression from 4 which seems to have helped overall. Culture from last week grew again pansensitive Proteus, this should've been sensitive to the doxycycline I gave him and he is finishing that today. The patient is had previous arterial and venous review by vascular surgery. Patient is currently using Aquacel Ag under a 3 layer compression. 03/17/16; patient's wound dimensions are down this week. He has been using silver alginate 03/24/2016 - Mr. Wrisley arrives today  for management of RLE venous ulcer. The alginate dressing is densly adhered to the ulcer. He offers no complaints, concerns, or  needs. 03/31/16; no real change in the wound measurements post debridement. Using Prisma. If anything the measurements are larger today at 2 x 1 cm post debridement 04-07-16 Mr. Tomei arrives today for management of his right lower extremity venous ulcer. He is voicing no complaints associated with his wound over the last week. He does inquire about need for compression therapy, this appears to be a weekly inquiry. He was advised that compression therapy is indicated throughout the treatment of the wound and he will then transition to compression stockings. He is compliant with compression stockings to the left lower extremity. 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 The base of the wound is healthy we're using Hydrofera Blue. Measurements are smaller 04/21/16; patient has severe chronic venous insufficiency on the right medial lower leg. He is here with a venous insufficiency ulcer in that location. He continues to make progress in terms of wound area. Surface of the wound also appears to have very healthy granulation we have been using Hydrofera Blue and there seems to be very little reason to change. 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 He has an ulcer in his right medial lower leg. We have been making very gradual progress here using Hydrofera Blue for the last several weeks Objective Constitutional Patient is hypertensive.. Pulse regular and within target range for patient.[name] Respirations regular, non-labored and within target range.. Temperature is normal and within the target range for the patient.. Patient's appearance is neat and clean. Appears in no acute distress. Well nourished and well developed.. Vitals Time Taken: 8:45 AM, Height: 74 in, Weight: 240 lbs, BMI: 30.8, Temperature: 98.4 F, Pulse: 64 bpm, Respiratory Rate: 19 breaths/min, Blood Pressure: 149/95  mmHg. Eyes Conjunctivae clear. No discharge.. General Notes: Wound exam; visibly the surface of this looks reasonably stable although we have not been making much progress in terms of reduction a wound area. His peripheral pulses are vibrant. There is no evidence of infection. Using #3 curet the surface of the wound was debrided to remove surface slough and nonviable tissue. Even doing this there didn't seem to be a lot of evidence of a nonvisualized Kirchoff, Aldrich ([id number]) surface/bioburden Integumentary (Hair, Skin) Wound #3 status is Open. Original cause of wound was Gradually Appeared. The wound is located on the Right Lower Leg. The wound measures 1.4cm length x 0.5cm width x 0.1cm depth; 0.55cm^2 area and 0.055cm^3 volume. The wound is limited to skin breakdown. There is no tunneling or undermining noted. There is a medium amount of serosanguineous drainage noted. The wound margin is flat and intact. There is medium (34-66%) red, pink, pale granulation within the wound bed. There is no necrotic tissue within the wound bed. The periwound skin appearance exhibited: Induration, Scarring, Moist, Hemosiderin Staining, Mottled. The periwound skin appearance did not exhibit: Callus, Crepitus, Excoriation, Fluctuance, Friable, Localized Edema, Rash, Dry/Scaly, Maceration, Atrophie Blanche, Cyanosis, Ecchymosis, Pallor, Rubor, Erythema. Periwound temperature was noted as No Abnormality. 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 There was a Skin/Subcutaneous Tissue Debridement ([id number]) debridement with total area of 0.7 sq cm performed  by Ricard Dillon, MD. with the following instrument(s): Curette to remove Non-Viable  tissue/material including Fibrin/Slough and Subcutaneous after achieving pain control using Lidocaine 4% Topical Solution. A time out was conducted at 09:07, prior to the start of the procedure. A Minimum amount of bleeding was controlled with Pressure. The procedure was tolerated well with a pain level of 0 throughout and a pain level of 0 following the procedure. Post Debridement Measurements: 1.4cm length x 0.5cm width x 0.1cm depth; 0.055cm^3 volume. Character of Wound/Ulcer Post Debridement is stable. Severity of Tissue Post Debridement is: Fat layer exposed. Post procedure Diagnosis Wound #3: Same as Pre-Procedure Shen, Kerry ([id number]) Plan Wound Cleansing: Wound #3 Right Lower Leg: Clean wound with Normal Saline. Cleanse wound with mild soap and water - at clinic Anesthetic: Wound #3 Right Lower Leg: Topical Lidocaine 4% cream applied to wound bed prior to debridement Skin Barriers/Peri-Wound Care: Wound #3 Right Lower Leg: Barrier cream Primary Wound Dressing: Wound #3 Right Lower Leg: Prisma Ag Secondary Dressing: Wound #3 Right Lower Leg: ABD pad Dry Gauze Dressing Change Frequency: Wound #3 Right Lower Leg: Change dressing every week Follow-up Appointments: Wound #3 Right Lower Leg: Return Appointment in 1 week. Edema Control: Wound #3 Right Lower Leg: 3 Layer Compression System - Right Lower Extremity - unna to anchor Additional Orders / Instructions: Wound #3 Right Lower Leg: Increase protein intake. #1 we have changed to prisma, not enough progress with hydrofera blue Electronic Signature(s) Signed: 04/28/2016 4:39:00 PM By: [name] MD Entered By: [name] on 04/28/2016 09:16:44 Geron, [name] ([id number]) -------------------------------------------------------------------------------- SuperBill Details Callies, Date of Service: 04/28/2016 Patient Name: [facility] Patient Account Number: [id number] Medical Record Treating RN:  Baruch Gouty, RN, [name], Velva Harman [id number] Number: Other Clinician: Date of Birth/Sex: 06/21/1950 (65 y.o. Male) Treating [name], MICHAEL Primary Care Physician/Extender: Tonye Pearson Physician: Weeks in Treatment: 16 Referring Physician: Royetta Crochet Diagnosis Coding ICD-10 Codes Code Description W54.627 Non-pressure chronic ulcer of right ankle limited to breakdown of skin Chronic venous hypertension (idiopathic) with ulcer and inflammation of right lower I87.331 extremity Facility Procedures CPT4: Description Modifier Quantity Code [id number] 11042 - DEB SUBQ TISSUE 20 SQ CM/< 1 ICD-10 Description Diagnosis L97.311 Non-pressure chronic ulcer of right ankle limited to breakdown of skin I87.331 Chronic venous hypertension (idiopathic) with ulcer  and inflammation of right lower extremity Physician Procedures CPT4: Description Modifier Quantity Code [id number] - WC PHYS SUBQ TISS 20 SQ CM 1 ICD-10 Description Diagnosis L97.311 Non-pressure chronic ulcer of right ankle limited to breakdown of skin I87.331 Chronic venous hypertension (idiopathic) with ulcer  and inflammation of right lower extremity Electronic Signature(s) Signed: 04/28/2016 4:39:00 PM By: [name] MD Entered By: [name] on 04/28/2016 09:17:18

## 2016-04-29 NOTE — Progress Notes (Signed)
[name], [name] ([id number]) Visit Report for 04/28/2016 Arrival Information Details Stallworth, Date of Service: 04/28/2016 8:15 AM Patient Name: [name] Patient Account Number: [id number] Medical Record Treating RN: Baruch Gouty, RN, [name], Velva Harman [id number] Number: Other Clinician: Date of Birth/Sex: [birth year] (65 y.o. Male) Treating [name] Primary Care Physician/Extender: [name] Physician: Referring Physician: Doristine [name] in Treatment: 46 Visit Information History Since Last Visit All ordered tests and consults were completed: No Patient Arrived: Ambulatory Added or deleted any medications: No Arrival Time: 08:45 Any new allergies or adverse reactions: No Accompanied [name]: self Had a fall or experienced change in No Transfer Assistance: None activities of daily living that may affect Patient Identification Verified: Yes risk of falls: Secondary Verification Process Yes Signs or symptoms of abuse/neglect since last No Completed: visito Patient Requires Transmission- No Hospitalized since last visit: No Based Precautions: Has Dressing in Place as Prescribed: Yes Patient Has Alerts: Yes Has Compression in Place as Prescribed: Yes Patient Alerts: Patient on Blood Pain Present Now: No Thinner warfarin Electronic Signature(s) Signed: 04/28/2016 5:11:17 PM [name]: [name] [name], RN [name]: [name] on 04/28/2016 08:45:25 [name], [name] ([id number]) -------------------------------------------------------------------------------- Encounter Discharge Information Details [name], Date of Service: 04/28/2016 8:15 AM Patient Name: [name] Patient Account Number: [id number] Medical Record Treating RN: Baruch Gouty, RN, [name], Velva Harman [id number] Number: Other Clinician: Date of Birth/Sex: 1950/05/29 (66 y.o. Male) Treating [name] Primary Care Physician/Extender: [name] Physician: Referring Physician: Doristine [name] in Treatment:  16 Encounter Discharge Information Items Discharge Pain Level: 0 Discharge Condition: Stable Ambulatory Status: Ambulatory Discharge Destination: Home Transportation: Private Auto Accompanied [name]: self Schedule Follow-up Appointment: No Medication Reconciliation completed and provided to Patient/Care No Oral Remache: Provided on Clinical Summary of Care: 04/28/2016 Form Type Recipient Paper Patient WD Electronic Signature(s) Signed: 04/28/2016 11:24:43 AM [name]: [name] [name], RN Previous Signature: 04/28/2016 9:17:42 AM Version [name]: [name] [name]: [name] on 04/28/2016 11:24:42 [name], [name] ([id number]) -------------------------------------------------------------------------------- Lower Extremity Assessment Details [name], Date of Service: 04/28/2016 8:15 AM Patient Name: [name] Patient Account Number: [id number] Medical Record Treating RN: Baruch Gouty RN, [name], Velva Harman [id number] Number: Other Clinician: Date of Birth/Sex: 23-Aug-1950 (65 y.o. Male) Treating [name] Primary Care Physician/Extender: [name] Physician: Referring Physician: Doristine [name] in Treatment: 16 Edema Assessment Assessed: [Left: No] [Right: No] Edema: [Left: Ye] [Right: s] Calf Left: Right: Point of Measurement: 36 cm From Medial Instep cm 42.1 cm Ankle Left: Right: Point of Measurement: 14 cm From Medial Instep cm 26.3 cm Vascular Assessment Claudication: Claudication Assessment [Right:None] Pulses: Posterior Tibial Dorsalis Pedis Palpable: [Right:Yes] Extremity colors, hair growth, and conditions: Extremity Color: [Right:Dusky] Hair Growth on Extremity: [Right:Yes] Temperature of Extremity: [Right:Warm] Capillary Refill: [Right:< 3 seconds] Toe Nail Assessment Left: Right: Thick: Yes Discolored: Yes Deformed: No Improper Length and Hygiene: Yes Electronic Signature(s) [name] ([id number]) Signed: 04/28/2016 5:11:17 PM [name]: [name]  [name], RN [name]: [name] on 04/28/2016 08:46:33 [name], [name] ([id number]) -------------------------------------------------------------------------------- Multi Wound Chart Details [name], Date of Service: 04/28/2016 8:15 AM Patient Name: [name] Patient Account Number: [id number] Medical Record Treating RN: Baruch Gouty RN, [name], Velva Harman [id number] Number: Other Clinician: Date of Birth/Sex: [birth year] (65 y.o. Male) Treating [name] Primary Care Physician/Extender: [name] Physician: Referring Physician: Doristine [name] in Treatment: 16 Vital Signs Height(in): 74 Pulse(bpm): 64 Weight(lbs): 240 Blood Pressure 149/95 (mmHg): Body Mass Index(BMI): 31 Temperature(F): 98.4 Respiratory Rate 19 (breaths/min): Photos: [3:No Photos] [N/A:N/A] Wound Location: [3:Right Lower Leg] [N/A:N/A] Wounding Event: [3:Gradually Appeared] [N/A:N/A] Primary  Etiology: [3:Venous Leg Ulcer] [N/A:N/A] Comorbid History: [3:Arrhythmia, Hypertension, N/A Gout, Neuropathy] Date Acquired: [3:02/11/2016] [N/A:N/A] Weeks of Treatment: [3:10] [N/A:N/A] Wound Status: [3:Open] [N/A:N/A] Clustered Wound: [3:Yes] [N/A:N/A] Measurements L x W x D 1.4x0.5x0.1 [N/A:N/A] (cm) Area (cm) : [3:0.55] [N/A:N/A] Volume (cm) : [3:0.055] [N/A:N/A] % Reduction in Area: [3:89.90%] [N/A:N/A] % Reduction in Volume: 89.90% [N/A:N/A] Classification: [3:Full Thickness Without Exposed Support Structures] [N/A:N/A] Exudate Amount: [3:Medium] [N/A:N/A] Exudate Type: [3:Serosanguineous] [N/A:N/A] Exudate Color: [3:red, brown] [N/A:N/A] Wound Margin: [3:Flat and Intact] [N/A:N/A] Granulation Amount: [3:Medium (34-66%)] [N/A:N/A] Granulation Quality: [3:Red, Pink, Pale] [N/A:N/A] Necrotic Amount: [3:None Present (0%)] [N/A:N/A] Exposed Structures: [N/A:N/A] Fascia: No Fat: No Tendon: No Muscle: No Joint: No Bone: No Limited to Skin Breakdown Epithelialization: Medium (34-66%) N/A  N/A Periwound Skin Texture: Induration: Yes N/A N/A Scarring: Yes Edema: No Excoriation: No Callus: No Crepitus: No Fluctuance: No Friable: No Rash: No Periwound Skin Moist: Yes N/A N/A Moisture: Maceration: No Dry/Scaly: No Periwound Skin Color: Hemosiderin Staining: Yes N/A N/A Mottled: Yes Atrophie Blanche: No Cyanosis: No Ecchymosis: No Erythema: No Pallor: No Rubor: No Temperature: No Abnormality N/A N/A Tenderness on No N/A N/A Palpation: Wound Preparation: Ulcer Cleansing: N/A N/A Rinsed/Irrigated with Saline, Other: Soap and water Topical Anesthetic Applied: Other: lidocaine 4% Treatment Notes Electronic Signature(s) Signed: 04/28/2016 5:11:17 PM [name]: [name] [name], RN [name]: [name] on 04/28/2016 09:03:22 Gros, [name] ([id number]) -------------------------------------------------------------------------------- Multi-Disciplinary Care Plan Details Reining, Date of Service: 04/28/2016 8:15 AM Patient Name: [name] Patient Account Number: [id number] Medical Record Treating RN: Baruch Gouty RN, [name], Velva Harman [id number] Number: Other Clinician: Date of Birth/Sex: 29-Nov-1950 (65 y.o. Male) Treating [name] Primary Care Physician/Extender: [name] Physician: Referring Physician: Doristine [name] in Treatment: 16 Active Inactive Venous Leg Ulcer Nursing Diagnoses: Actual venous Insuffiency (use after diagnosis is confirmed) Goals: Patient will maintain optimal edema control Date Initiated: 02/17/2016 Goal Status: Active Interventions: Compression as ordered Notes: Wound/Skin Impairment Nursing Diagnoses: Impaired tissue integrity Goals: Patient/caregiver will verbalize understanding of skin care regimen Date Initiated: 02/17/2016 Goal Status: Active Ulcer/skin breakdown will have a volume reduction of 30% [name] week 4 Date Initiated: 02/17/2016 Goal Status: Active Interventions: Assess ulceration(s) every  visit Notes: Electronic Signature(s) ALLARD, LIGHTSEY ([id number]) Signed: 04/28/2016 5:11:17 PM [name]: [name] [name], RN [name]: [name] on 04/28/2016 09:02:54 Frede, [name] ([id number]) -------------------------------------------------------------------------------- Pain Assessment Details Shambley, Date of Service: 04/28/2016 8:15 AM Patient Name: [name] Patient Account Number: [id number] Medical Record Treating RN: Baruch Gouty RN, [name], Velva Harman [id number] Number: Other Clinician: Date of Birth/Sex: [birth year] (65 y.o. Male) Treating [name] Primary Care Physician/Extender: [name] Physician: Referring Physician: Doristine [name] in Treatment: 16 Active Problems Location of Pain Severity and Description of Pain Patient Has Paino No Site Locations With Dressing Change: No Pain Management and Medication Current Pain Management: Electronic Signature(s) Signed: 04/28/2016 5:11:17 PM [name]: [name] [name], RN [name]: [name] on 04/28/2016 08:45:31 Cowens, [name] ([id number]) -------------------------------------------------------------------------------- Patient/Caregiver Education Details Donado, Date of Service: 04/28/2016 8:15 AM Patient Name: [name] Patient Account Number: [id number] Medical Record Treating RN: Baruch Gouty RN, [name], Velva Harman [id number] Number: Other Clinician: Date of Birth/Gender: November 25, 1950 (65 y.o. Male) Treating [name] Primary Care Physician/Extender: [name] Physician: Suella Grove in Treatment: 16 Referring Physician: Royetta Crochet Education Assessment Education Provided To: Patient Education Topics Provided Wound Debridement: Methods: Explain/Verbal Responses: State content correctly Wound/Skin Impairment: Methods: Explain/Verbal Responses: State content correctly Electronic Signature(s) Signed: 04/28/2016 5:11:17 PM [name]: [name] [name], RN [name]: [name] on 04/28/2016  11:24:59 KNUT, RONDINELLI ([id number]) -------------------------------------------------------------------------------- Wound Assessment Details Buehrer, Date of Service: 04/28/2016 8:15 AM Patient Name: [facility] Patient Account Number: [id number] Medical Record Treating RN: Afful, RN, [name], Velva Harman [id number] Number: Other Clinician: Date of Birth/Sex: 10-05-1950 (65 y.o. Male) Treating [name] Primary Care Physician/Extender: [name] Physician: Referring Physician: Doristine [name] in Treatment: 16 Wound Status Wound Number: 3 Primary Venous Leg Ulcer Etiology: Wound Location: Right Lower Leg Wound Status: Open Wounding Event: Gradually Appeared Comorbid Arrhythmia, Hypertension, Gout, Date Acquired: 02/11/2016 History: Neuropathy Weeks Of Treatment: 10 Clustered Wound: Yes Photos Photo Uploaded [name]: [name] on 04/28/2016 17:16:30 Wound Measurements Length: (cm) 1.4 Width: (cm) 0.5 Depth: (cm) 0.1 Area: (cm) 0.55 Volume: (cm) 0.055 % Reduction in Area: 89.9% % Reduction in Volume: 89.9% Epithelialization: Medium (34-66%) Tunneling: No Undermining: No Wound Description Full Thickness Without Exposed Classification: Support Structures Wound Margin: Flat and Intact Exudate Medium Amount: Exudate Type: Serosanguineous Exudate Color: red, brown Foul Odor After Cleansing: No Wound Bed Bertran, Ekin ([id number]) Granulation Amount: Medium (34-66%) Exposed Structure Granulation Quality: Red, Pink, Pale Fascia Exposed: No Necrotic Amount: None Present (0%) Fat Layer Exposed: No Tendon Exposed: No Muscle Exposed: No Joint Exposed: No Bone Exposed: No Limited to Skin Breakdown Periwound Skin Texture Texture Color No Abnormalities Noted: No No Abnormalities Noted: No Callus: No Atrophie Blanche: No Crepitus: No Cyanosis: No Excoriation: No Ecchymosis: No Fluctuance: No Erythema: No Friable: No Hemosiderin Staining:  Yes Induration: Yes Mottled: Yes Localized Edema: No Pallor: No Rash: No Rubor: No Scarring: Yes Temperature / Pain Moisture Temperature: No Abnormality No Abnormalities Noted: No Dry / Scaly: No Maceration: No Moist: Yes Wound Preparation Ulcer Cleansing: Rinsed/Irrigated with Saline, Other: Soap and water, Topical Anesthetic Applied: Other: lidocaine 4%, Treatment Notes Wound #3 (Right Lower Leg) 1. Cleansed with: Cleanse wound with antibacterial soap and water May Shower, gently pat wound dry prior to applying new dressing. May shower with protection 3. Peri-wound Care: Barrier cream Moisturizing lotion 4. Dressing Applied: Prisma Ag 5. Secondary Dressing Applied ABD Pad 7. Secured with 3 Layer Compression System - Right Lower Extremity XYLAN, SHEILS ([id number]) Electronic Signature(s) Signed: 04/28/2016 5:11:17 PM [name]: [name] [name], RN [name]: [name] on 04/28/2016 08:54:04 Regner, [name] ([id number]) -------------------------------------------------------------------------------- Vitals Details Polak, Date of Service: 04/28/2016 8:15 AM Patient Name: [name] Patient Account Number: [id number] Medical Record Treating RN: Baruch Gouty, RN, [name], Velva Harman [id number] Number: Other Clinician: Date of Birth/Sex: May 06, 1951 (65 y.o. Male) Treating [name] Primary Care Physician/Extender: [name] Physician: Referring Physician: Doristine [name] in Treatment: 16 Vital Signs Time Taken: 08:45 Temperature (F): 98.4 Height (in): 74 Pulse (bpm): 64 Weight (lbs): 240 Respiratory Rate (breaths/min): 19 Body Mass Index (BMI): 30.8 Blood Pressure (mmHg): 149/95 Reference Range: 80 - 120 mg / dl Electronic Signature(s) Signed: 04/28/2016 5:11:17 PM [name]: [name] [name], RN [name]: [name] on 04/28/2016 08:50:01

## 2016-05-05 ENCOUNTER — Encounter: Payer: Medicare Other | Admitting: Internal Medicine

## 2016-05-05 DIAGNOSIS — I87331 Chronic venous hypertension (idiopathic) with ulcer and inflammation of right lower extremity: Secondary | ICD-10-CM | POA: Diagnosis not present

## 2016-05-06 NOTE — Progress Notes (Signed)
[name], [name] ([id number]) Visit Report for 05/05/2016 Arrival Information Details [name], Date of Service: 05/05/2016 8:15 AM Patient Name: [name] Patient Account Number: [id number] Medical Record Treating RN: Baruch Gouty, RN, [name], Velva [name] [id number] Number: Other Clinician: Date of Birth/Sex: December 14, 1950 (65 y.o. Male) Treating Dellia Nims, [name] Primary Care Physician/Extender: Tonye Pearson Physician: Referring Physician: Doristine Locks in Treatment: 66 Visit Information History Since Last Visit All ordered tests and consults were completed: No Patient Arrived: Ambulatory Added or deleted any medications: No Arrival Time: 08:13 Any new allergies or adverse reactions: No Accompanied By: self Had a fall or experienced change in No Transfer Assistance: None activities of daily living that may affect Patient Identification Verified: Yes risk of falls: Secondary Verification Process Yes Signs or symptoms of abuse/neglect since last No Completed: visito Patient Requires Transmission- No Hospitalized since last visit: No Based Precautions: Has Dressing in Place as Prescribed: Yes Patient Has Alerts: Yes Has Compression in Place as Prescribed: Yes Patient Alerts: Patient on Blood Pain Present Now: No Thinner warfarin Electronic Signature(s) Signed: 05/05/2016 5:17:40 PM By: [name] [name], RN Entered By: [name] on 05/05/2016 08:13:46 [name] ([id number]) -------------------------------------------------------------------------------- Clinic Level of Care Assessment Details [name], Date of Service: 05/05/2016 8:15 AM Patient Name: [name] Patient Account Number: [id number] Medical Record Treating RN: [name] [id number] Number: Other Clinician: Date of Birth/Sex: 1950-11-11 (65 y.o. Male) Treating [name], [name] Primary Care Physician/Extender: Tonye Pearson Physician: Referring Physician: Doristine Locks in Treatment:  17 Clinic Level of Care Assessment Items TOOL 4 Quantity Score '[]'$  - Use when only an EandM is performed on FOLLOW-UP visit 0 ASSESSMENTS - Nursing Assessment / Reassessment X - Reassessment of Co-morbidities (includes updates in patient status) 1 10 X - Reassessment of Adherence to Treatment Plan 1 5 ASSESSMENTS - Wound and Skin Assessment / Reassessment '[]'$  - Simple Wound Assessment / Reassessment - one wound 0 '[]'$  - Complex Wound Assessment / Reassessment - multiple wounds 0 '[]'$  - Dermatologic / Skin Assessment (not related to wound area) 0 ASSESSMENTS - Focused Assessment '[]'$  - Circumferential Edema Measurements - multi extremities 0 '[]'$  - Nutritional Assessment / Counseling / Intervention 0 X - Lower Extremity Assessment (monofilament, tuning fork, pulses) 1 5 '[]'$  - Peripheral Arterial Disease Assessment (using hand held doppler) 0 ASSESSMENTS - Ostomy and/or Continence Assessment and Care '[]'$  - Incontinence Assessment and Management 0 '[]'$  - Ostomy Care Assessment and Management (repouching, etc.) 0 PROCESS - Coordination of Care X - Simple Patient / Family Education for ongoing care 1 15 '[]'$  - Complex (extensive) Patient / Family Education for ongoing care 0 X - Staff obtains Consents, Records, Test Results / Process Orders 1 10 [name] '[]'$  - Staff telephones HHA, Nursing Homes / Clarify orders / etc 0 '[]'$  - Routine Transfer to another Facility (non-emergent condition) 0 '[]'$  - Routine [name] Admission (non-emergent condition) 0 '[]'$  - New Admissions / Biomedical engineer / Ordering NPWT, Apligraf, etc. 0 '[]'$  - Emergency [name] Admission (emergent condition) 0 '[]'$  - Simple Discharge Coordination 0 '[]'$  - Complex (extensive) Discharge Coordination 0 PROCESS - Special Needs '[]'$  - Pediatric / Minor Patient Management 0 '[]'$  - Isolation Patient Management 0 '[]'$  - Hearing / Language / Visual special needs 0 '[]'$  - Assessment of Community assistance (transportation, D/C  planning, etc.) 0 '[]'$  - Additional assistance / Altered mentation 0 '[]'$  - Support Surface(s) Assessment (bed, cushion, seat, etc.) 0 INTERVENTIONS - Wound Cleansing / Measurement X - Simple Wound Cleansing - one wound  1 5 '[]'$  - Complex Wound Cleansing - multiple wounds 0 X - Wound Imaging (photographs - any number of wounds) 1 5 '[]'$  - Wound Tracing (instead of photographs) 0 X - Simple Wound Measurement - one wound 1 5 '[]'$  - Complex Wound Measurement - multiple wounds 0 INTERVENTIONS - Wound Dressings X - Small Wound Dressing one or multiple wounds 1 10 '[]'$  - Medium Wound Dressing one or multiple wounds 0 '[]'$  - Large Wound Dressing one or multiple wounds 0 '[]'$  - Application of Medications - topical 0 '[]'$  - Application of Medications - injection 0 [name] ([id number]) INTERVENTIONS - Miscellaneous '[]'$  - External ear exam 0 '[]'$  - Specimen Collection (cultures, biopsies, blood, body fluids, etc.) 0 '[]'$  - Specimen(s) / Culture(s) sent or taken to Lab for analysis 0 '[]'$  - Patient Transfer (multiple staff / Civil Service fast streamer / Similar devices) 0 '[]'$  - Simple Staple / Suture removal (25 or less) 0 '[]'$  - Complex Staple / Suture removal (26 or more) 0 '[]'$  - Hypo / Hyperglycemic Management (close monitor of Blood Glucose) 0 '[]'$  - Ankle / Brachial Index (ABI) - do not check if billed separately 0 X - Vital Signs 1 5 Has the patient been seen at the [name] within the last three years: Yes Total Score: 75 Level Of Care: New/Established - Level 2 Electronic Signature(s) Signed: 05/05/2016 5:17:40 PM By: [name] [name], RN Entered By: [name] on 05/05/2016 09:07:37 [name] ([id number]) -------------------------------------------------------------------------------- Encounter Discharge Information Details [name], Date of Service: 05/05/2016 8:15 AM Patient Name: [name] Patient Account Number: [id number] Medical Record Treating RN: Baruch Gouty, RN, [name], Velva [name] [id number] Number: Other  Clinician: Date of Birth/Sex: Dec 24, 1950 (65 y.o. Male) Treating [name], [name] Primary Care Physician/Extender: Tonye Pearson Physician: Referring Physician: Doristine Locks in Treatment: 17 Encounter Discharge Information Items Discharge Pain Level: 0 Discharge Condition: Stable Ambulatory Status: Ambulatory Discharge Destination: Home Transportation: Other Accompanied By: self Schedule Follow-up Appointment: No Medication Reconciliation completed and provided to Patient/Care No Charlie Char: Provided on Clinical Summary of Care: 05/05/2016 Form Type Recipient Paper Patient WD Electronic Signature(s) Signed: 05/05/2016 4:26:02 PM By: [name] [name], RN Previous Signature: 05/05/2016 8:49:19 AM Version By: Ruthine Dose Entered By: [name] on 05/05/2016 16:26:02 Sevey, [name] ([id number]) -------------------------------------------------------------------------------- Lower Extremity Assessment Details Blades, Date of Service: 05/05/2016 8:15 AM Patient Name: [name] Patient Account Number: [id number] Medical Record Treating RN: Baruch Gouty, RN, [name], Velva [name] [id number] Number: Other Clinician: Date of Birth/Sex: [birth year] (65 y.o. Male) Treating [name], [name] Primary Care Physician/Extender: Tonye Pearson Physician: Referring Physician: Doristine Locks in Treatment: 17 Edema Assessment Assessed: [Left: No] [Right: No] Edema: [Left: N] [Right: o] Calf Left: Right: Point of Measurement: 36 cm From Medial Instep cm 42.2 cm Ankle Left: Right: Point of Measurement: 14 cm From Medial Instep cm 28.5 cm Vascular Assessment Claudication: Claudication Assessment [Right:None] Pulses: Dorsalis Pedis Palpable: [Right:Yes] Posterior Tibial Extremity colors, hair growth, and conditions: Extremity Color: [Right:Dusky] Hair Growth on Extremity: [Right:Yes] Temperature of Extremity: [Right:Warm] Capillary Refill: [Right:< 3 seconds] Toe Nail  Assessment Left: Right: Thick: Yes Discolored: Yes Deformed: No Improper Length and Hygiene: Yes Electronic Signature(s) COMPTON, BRIGANCE ([id number]) Signed: 05/05/2016 5:17:40 PM By: [name] [name], RN Entered By: [name] on 05/05/2016 08:21:43 [place], [name] ([id number]) -------------------------------------------------------------------------------- Multi Wound Chart Details Kiesler, Date of Service: 05/05/2016 8:15 AM Patient Name: [name] Patient Account Number: [id number] Medical Record Treating RN: Baruch Gouty RN, [name], Velva [name] [id number] Number: Other Clinician: Date of Birth/Sex: Sep 23, 1950 (65 y.o. 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] [N/A:N/A] Comorbid History: [3:Arrhythmia, Hypertension, N/A Gout, Neuropathy] Date Acquired: [3:02/11/2016] [N/A:N/A] Weeks of Treatment: [3:11] [N/A:N/A] Wound Status: [3:Open] [N/A:N/A] Clustered Wound: [3:Yes] [N/A:N/A] Measurements L x W x D 0.1x0.1x0.1 [N/A:N/A] (cm) Area (cm) : [3:0.008] [N/A:N/A] Volume (cm) : [3:0.001] [N/A:N/A] % Reduction in Area: [3:99.90%] [N/A:N/A] % Reduction in Volume: 99.80% [N/A:N/A] Classification: [3:Full Thickness Without Exposed Support Structures] [N/A:N/A] Exudate Amount: [3:None Present] [N/A:N/A] Wound Margin: [3:Flat and Intact] [N/A:N/A] Granulation Amount: [3:Medium (34-66%)] [N/A:N/A] Granulation Quality: [3:Red, Pink, Pale] [N/A:N/A] Necrotic Amount: [3:None Present (0%)] [N/A:N/A] Exposed Structures: [3:Fascia: No Fat: No  Tendon: No] [N/A:N/A] Muscle: No Joint: No Bone: No Limited to Skin Breakdown Epithelialization: Large (67-100%) N/A N/A Periwound Skin Texture: Induration: Yes N/A N/A Scarring: Yes Edema: No Excoriation: No Callus: No Crepitus: No Fluctuance: No Friable: No Rash: No Periwound Skin Dry/Scaly: Yes N/A N/A Moisture: Maceration: No Moist: No Periwound Skin Color: Hemosiderin Staining: Yes N/A N/A Mottled: Yes Atrophie Blanche: No Cyanosis: No Ecchymosis: No Erythema: No Pallor: No Rubor: No Temperature: No Abnormality N/A N/A Tenderness on No N/A N/A Palpation: Wound Preparation: Ulcer Cleansing: N/A N/A Rinsed/Irrigated with Saline, Other: Soap and water Topical Anesthetic Applied: None Treatment Notes Electronic Signature(s) Signed: 05/05/2016 5:40:41 PM By: [name] MD Previous Signature: 05/05/2016 9:06:46 AM Version By: [name] [name], RN Entered By: [name] on 05/05/2016 09:11:26 Rudd, [name] ([id number]) -------------------------------------------------------------------------------- Multi-Disciplinary Care Plan Details Kovacic, Date of Service: 05/05/2016 8:15 AM Patient Name: [name] Patient Account Number: [id number] Medical Record Treating RN: Baruch Gouty RN, [name], Velva [name] [id number] Number: Other Clinician: Date of Birth/Sex: 1951/01/07 (65 y.o. Male) Treating [name] Primary Care Physician/Extender: Tonye Pearson Physician: Referring Physician: Doristine Locks in Treatment: 17 Active Inactive Electronic Signature(s) Signed: 05/05/2016 9:06:34 AM By: [name] [name], RN Entered By: [name] on 05/05/2016 09:06:34 [place], [name] ([id number]) -------------------------------------------------------------------------------- Pain Assessment Details Amorin, Date of Service: 05/05/2016 8:15 AM Patient Name: [name] Patient Account Number: [id number] Medical Record Treating RN: Baruch Gouty RN, [name],  Velva [name] [id number] Number: Other Clinician: Date of Birth/Sex: 1950-11-05 (65 y.o. Male) Treating [name], [name] Primary Care Physician/Extender: Tonye Pearson Physician: Referring Physician: Doristine Locks in Treatment: 17 Active Problems Location of Pain Severity and Description of Pain Patient Has Paino No Site Locations With Dressing Change: No Pain Management and Medication Current Pain Management: Electronic Signature(s) Signed: 05/05/2016 5:17:40 PM By: [name] [name], RN Entered By: [name] on 05/05/2016 08:14:35 Nicolaou, [name] ([id number]) -------------------------------------------------------------------------------- Patient/Caregiver Education Details Zhen, Date of Service: 05/05/2016 8:15 AM Patient Name: [name] Patient Account Number: [id number] Medical Record Treating RN: Baruch Gouty RN, [name], Velva [name] [id number] Number: Other Clinician: Date of Birth/Gender: 1950/09/01 (65 y.o. Male) Treating [name] Primary Care Physician/Extender: Tonye Pearson Physician: Suella Grove in Treatment: 17 Referring Physician: Royetta Crochet Education Assessment Education Provided To: Patient Education Topics Provided Wound/Skin Impairment: Methods: Explain/Verbal Responses: State content correctly Electronic Signature(s) Signed: 05/05/2016 5:17:40 PM By: [name] [name], RN Entered By: [name] on 05/05/2016 [place], [name] ([id number]) -------------------------------------------------------------------------------- Wound Assessment Details Delcarlo, Date of Service: 05/05/2016 8:15 AM Patient Name: [name] Patient Account Number: [id number] Medical Record Treating RN: Baruch Gouty RN, [name], Velva [name] [id number] Number: Other Clinician: Date of Birth/Sex:  1951/01/15 (65 y.o. Male) Treating [name], [name] Primary Care Physician/Extender: Tonye Pearson Physician: Referring Physician: Doristine Locks in Treatment: 17 Wound Status Wound  Number: 3 Primary Venous Leg Ulcer Etiology: Wound Location: Right Lower Leg Wound Status: Healed - Epithelialized Wounding Event: Gradually Appeared Comorbid Arrhythmia, Hypertension, Gout, Date Acquired: 02/11/2016 History: Neuropathy Weeks Of Treatment: 11 Clustered Wound: Yes Photos Photo Uploaded By: [name] on 05/05/2016 16:43:11 Wound Measurements Length: (cm) 0 % Reduction i Width: (cm) 0 % Reduction i Depth: (cm) 0 Epithelializa Area: (cm) 0 Tunneling: Volume: (cm) 0 Undermining: n Area: 100% n Volume: 100% tion: Large (67-100%) No No Wound Description Full Thickness Without Exposed Classification: Support Structures Dasch, [name] ([id number]) Foul Odor After Cleansing: No Wound Margin: Flat and Intact Exudate None Present Amount: Wound Bed Granulation Amount: None Present (0%) Exposed Structure Necrotic Amount: None Present (0%) Fascia Exposed: No Fat Layer Exposed: No Tendon Exposed: No Muscle Exposed: No Joint Exposed: No Bone Exposed: No Limited to Skin Breakdown Periwound Skin Texture Texture Color No Abnormalities Noted: No No Abnormalities Noted: No Callus: No Atrophie Blanche: No Crepitus: No Cyanosis: No Excoriation: No Ecchymosis: No Fluctuance: No Erythema: No Friable: No Hemosiderin Staining: Yes Induration: No Mottled: Yes Localized Edema: No Pallor: No Rash: No Rubor: No Scarring: Yes Temperature / Pain Moisture Temperature: No Abnormality No Abnormalities Noted: No Dry / Scaly: Yes Maceration: No Moist: No Wound Preparation Ulcer Cleansing: Rinsed/Irrigated with Saline, Other: Soap and water, Topical Anesthetic Applied: None Electronic Signature(s) Signed: 05/05/2016 5:17:40 PM By: [name] [name], RN Entered By: [name] on 05/05/2016 16:26:55 Hach, [name] ([id number]) -------------------------------------------------------------------------------- Vitals Details Leavitt, Date of Service:  05/05/2016 8:15 AM Patient Name: [name] Patient Account Number: [id number] Medical Record Treating RN: Baruch Gouty, RN, [name], Velva [name] [id number] Number: Other Clinician: Date of Birth/Sex: 07-Jun-1950 (65 y.o. Male) Treating [name], [name] Primary Care Physician/Extender: Tonye Pearson Physician: Referring Physician: Doristine Locks in Treatment: 17 Vital Signs Time Taken: 08:14 Temperature (F): 98.1 Height (in): 74 Pulse (bpm): 70 Weight (lbs): 240 Respiratory Rate (breaths/min): 19 Body Mass Index (BMI): 30.8 Blood Pressure (mmHg): 167/97 Reference Range: 80 - 120 mg / dl Electronic Signature(s) Signed: 05/05/2016 5:17:40 PM By: [name] [name], RN Entered By: [name] on 05/05/2016 08:16:19

## 2016-05-06 NOTE — Progress Notes (Addendum)
[name] ([id number]) Visit Report for 05/05/2016 Chief Complaint Document Details [name], Date of Service: 05/05/2016 8:15 AM Patient Name: [facility] Patient Account Number: [id number] Medical Record Treating RN: Baruch Gouty, RN, [name], Velva Harman [id number] Number: Other Clinician: Date of Birth/Sex: 1951/05/16 (65 y.o. Male) Treating Dellia Nims, Kassondra Geil Primary Care Physician/Extender: Tonye Pearson Physician: Referring Physician: Doristine Locks in Treatment: 17 Information Obtained from: Patient Chief Complaint [name] presents today for routine management on his RLE venous ulcer Electronic Signature(s) Signed: 05/05/2016 5:40:41 PM By: [name] MD Entered By: [name] on 05/05/2016 09:11:38 [name] ([id number]) -------------------------------------------------------------------------------- HPI Details [name], Date of Service: 05/05/2016 8:15 AM Patient Name: [name] Patient Account Number: [id number] Medical Record Treating RN: Baruch Gouty RN, [name], Velva Harman [id number] Number: Other Clinician: Date of Birth/Sex: [birth year] (65 y.o. Male) Treating Fatou Dunnigan Primary Care Physician/Extender: Tonye Pearson Physician: Referring Physician: Doristine Locks in Treatment: 17 History of Present Illness Location: open wound just above his right ankle medially Quality: Patient tells me he is not having a significant amount of pain at this point in time. Severity: 1 out of 10 Duration: This just reopened in the past week. Timing: Pain in wound is Intermittent Context: The wound appeared gradually over time Modifying Factors: Consults to this date include: he was seen in the ER and was referred to a vascular surgeon but the patient has not done that. He may have been treated with clindamycin in the ER. Associated Signs and Symptoms: Patient reports having difficulty standing for long periods. HPI Description: 65 year old gentleman who was seen  in the emergency department recently on 01/06/2015 for a wound of his right lower extremity which he says was not involving any injury and he did not know how he sustained it. He had draining foul-smelling liquid from the area and had gone for care there. his past medical history is significant for DVT, hypertension, gout, tobacco abuse, cocaine abuse, stroke, atrial fibrillation, pulmonary embolism. he has also had some vascular surgery with a stent placed in his leg. He has been a smoker for many years and has given up straight drugs several years ago. He continues to smoke about 4-5 cigarettes a day. 02/03/2015 -- received a note from 05/14/2013 where Dr. Leotis Pain placed an inferior vena cava filter. The patient had a deep vein thrombosis while therapeutic on anticoagulation for previous DVT and a IVC filter was placed for this. 02/10/2015 -- he did have his vascular test done on Friday but we have no reports yet. 02/17/2015 -- notes were reviewed from the vascular office and the patient had a venous ultrasound done which revealed that he had no reflux in the greater saphenous vein or the short saphenous vein bilaterally. He did have subacute DVT in the common femoral vein and popliteal veins on the right and left side. The recommendation was to continue with Unna's boot therapy at the wound clinic and then to wear graduated compression stockings once the ulcers healed and later if he had continuous problems lymphedema pump would benefit him. 03/17/2015 -- we have applied for his insurance and aide regarding cellular tissue-based products and are still awaiting the final clearance. 03/24/2015 -- he has had Apligraf authorized for him but his wound is looking so good today that we may not use it. 03/31/2015 -- he has not yet received his compression stockings though we have called a couple of times and hopefully they should arrive this week. [name]  ([id number]) READMISSION 01/06/16; this is a patient we have  previously cared for in this clinic with wounds on his right medial ankle. I was not previously involved with his care. 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 He wears compression stockings. He had reflux studies done during his last stay in this clinic which did not show significant reflux in the greater or lesser saphenous veins bilaterally. His history is that he developed a open sore on the left medial malleolus one week ago. He was seen in his primary physician office and given a course of doxycycline which he still should be on. Previously seen vascular surgery who felt that he had some degree of lymphedema as well. He is not a diabetic 01/13/16 no major change 01/20/16; very small wound on the medial right ankle again covered with surface slough that doesn't seem to be spotting the Prisma 01/27/16; patient comes in today complaining of a lot of pain around the wound site. He has not been systemically unwell. 02/03/16; the patient's wound culture last week grew Proteus, I had empirically given doxycycline. The Proteus was not specifically plated against doxycycline however Proteus itself was fairly pansensitive and the patient comes back feeling a lot better today. I think the doxycycline was likely to be successful in sufficient 02/10/16; as predicted last week the area has closed over. These are probably venous insufficiency wounds although his previous reflux studies did not show superficial reflux. He also has a history of DVT and at one time had a Greenfield filter in place. The area in question on his left medial ankle region. It became secondarily infected but responded nicely to antibiotics. He is closed today 02/17/16 unfortunately patient's venous wound on the medial aspect of his right ankle at this point in time has reopened.  He has been using some compression hose which appear to be very light that he purchased he tells me out of a magazine. He seems a little frustrated with the fact that this has reopened and is concerned about his left lower extremity possibly reopening as well. 02/25/16 patient presents today for follow-up evaluation regarding his right ankle wound. Currently he shows no interval signs or symptoms of infection. We have been compression wrapping him unfortunately the wraps that we had on him last week and he has a significant amount of swelling above whether this had slipped down to. He also notes that he's been having some burning as well at the wound site. He rates his discomfort at this point in time to be a 2-3 out of 10. Otherwise he has no other worsening symptoms. 03/03/16; this is a patient that had a wound on his left medial ankle that I discharged on 02/10/16. He apparently reappeared the next week with open areas on his right medial ankle. Her intake nurse reports today that he has a lot of drainage and odor at intake even after the wound was cleaned. Also of note the patient complains of edema in the left leg and showed up with only one of the 2 layer compression system. 03/05/2016 -- since his visit 2 days ago to see Dr. Dellia Nims he complained of significant pain in his right lower extremity which was much more than he's ever had before. He came in for an urgent visit to review his condition. He has been placed on doxycycline empirically and his culture reports were reviewed but the final result is not back. 03/10/16;  patient was in last week to see Dr. Con Memos with increasing pain in his leg. He was reduced to a 3 layer compression from 4 which seems to have helped overall. Culture from last week grew again pansensitive Proteus, this should've been sensitive to the doxycycline I gave him and he is finishing that today. The patient is had previous arterial and venous review by vascular  surgery. Patient is currently using Aquacel Ag under a 3 layer compression. 03/17/16; patient's wound dimensions are down this week. He has been using silver alginate 03/24/2016 - Mr. Canion arrives today for management of RLE venous ulcer. The alginate dressing is Minch, Connelly ([id number]) densly adhered to the ulcer. He offers no complaints, concerns, or needs. 03/31/16; no real change in the wound measurements post debridement. Using Prisma. If anything the measurements are larger today at 2 x 1 cm post debridement 04-07-16 Mr. Tomei arrives today for management of his right lower extremity venous ulcer. He is voicing no complaints associated with his wound over the last week. He does inquire about need for compression therapy, this appears to be a weekly inquiry. He was advised that compression therapy is indicated throughout the treatment of the wound and he will then transition to compression stockings. He is compliant with compression stockings to the left lower extremity. 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 The base of the wound is healthy we're using Hydrofera Blue. Measurements are smaller 04/21/16; patient has severe chronic venous insufficiency on the right medial lower leg. He is here with a venous insufficiency ulcer in that location. He continues to make progress in terms of wound area. Surface of the wound also appears to have very healthy granulation we have been using Hydrofera Blue and there seems to be very little reason to change. 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 He has an ulcer in his right medial lower leg. We have been making very gradual progress here using Hydrofera Blue for the last several weeks 05/05/16; this patient has severe chronic venous insufficiency. 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 He has a right lower extremity wound. The area is mostly  fully epithelialized however there is small area of tightly adherent eschar. I did not remove this today. It is likely to be healed underneath although I did not prove this today. Be discharging him to [place] on 20-30 mm below-knee stockings Electronic Signature(s) Signed: 05/05/2016 5:40:41 PM By: [name] MD Entered By: [name] on 05/05/2016 09:13:07 Herzig, [name] ([id number]) -------------------------------------------------------------------------------- Physical Exam Details [place], Date of Service: 05/05/2016 8:15 AM Patient Name: [name] Patient Account Number: [id number] Medical Record Treating RN: Baruch Gouty RN, [name], Velva Harman [id number] Number: Other Clinician: Date of Birth/Sex: Oct 10, 1950 (65 y.o. Male) Treating Dellia Nims, Aminah Zabawa Primary Care Physician/Extender: Tonye Pearson Physician: Referring Physician: Doristine Locks in Treatment: 33 Constitutional Patient is hypertensive.. Pulse regular and within target range for patient.[name] Respirations regular, non-labored and within target range.. Temperature is normal and within the target range for the patient.. Patient's appearance is neat and clean. Appears in no acute distress. Well nourished and well developed.. Cardiovascular Pedal pulses palpable and strong bilaterally.. Edema present in both extremities. This is mild. Severe hemosiderin deposition. Notes Wound exam; the patient has a complete surface over this. There is middle part of eschar in the middle of the epithelium. The eschars tightly adherent I don't think that this is going to be a major issue. Electronic Signature(s) Signed: 05/05/2016 5:40:41 PM By:  [name] MD Entered By: [name] on 05/05/2016 09:14:07 Wieczorek, [name] ([id number]) -------------------------------------------------------------------------------- Physician Orders Details [place], Date of Service: 05/05/2016 8:15 AM Patient Name: [name] Patient Account  Number: [id number] Medical Record Treating RN: Baruch Gouty RN, [name], Velva Harman [id number] Number: Other Clinician: Date of Birth/Sex: Aug 20, 1950 (65 y.o. Male) Treating Ryli Standlee Primary Care Physician/Extender: Tonye Pearson Physician: Referring Physician: Doristine Locks in Treatment: 58 Verbal / Phone Orders: Yes Clinician: Afful, RN, [name], Rita Read Back and Verified: Yes Diagnosis Coding Discharge From [facility] Wound #3 Right Lower Leg o Discharge from [facility] Completed Electronic Signature(s) Signed: 05/05/2016 5:17:40 PM By: [name] [name], RN Signed: 05/05/2016 5:40:41 PM By: [name] MD Entered By: [name] on 05/05/2016 08:48:58 Chong, [name] ([id number]) -------------------------------------------------------------------------------- Problem List Details [place], Date of Service: 05/05/2016 8:15 AM Patient Name: [facility] Patient Account Number: [id number] Medical Record Treating RN: Baruch Gouty RN, [name], Velva Harman [id number] Number: Other Clinician: Date of Birth/Sex: 1950-08-22 (65 y.o. Male) Treating [name] Primary Care Physician/Extender: Tonye Pearson Physician: Referring Physician: Doristine Locks in Treatment: 17 Active Problems ICD-10 Encounter Code Description Active Date Diagnosis L97.311 Non-pressure chronic ulcer of right ankle limited to 03/03/2016 Yes breakdown of skin I87.331 Chronic venous hypertension (idiopathic) with ulcer and 03/03/2016 Yes inflammation of right lower extremity Inactive Problems Resolved Problems ICD-10 Code Description Active Date Resolved Date L97.321 Non-pressure chronic ulcer of left ankle limited to 01/06/2016 01/06/2016 breakdown of skin I87.332 Chronic venous hypertension (idiopathic) with ulcer and 01/06/2016 01/06/2016 inflammation of left lower extremity Electronic Signature(s) Signed: 05/05/2016 5:40:41 PM By: [name] MD Entered By: [name] on  05/05/2016 09:11:12 Stirling, [name] ([id number]) -------------------------------------------------------------------------------- Progress Note Details [place], Date of Service: 05/05/2016 8:15 AM Patient Name: [name] Patient Account Number: [id number] Medical Record Treating RN: Baruch Gouty, RN, [name], Velva Harman [id number] Number: Other Clinician: Date of Birth/Sex: 09-11-1950 (65 y.o. Male) Treating Denaja Verhoeven Primary Care Physician/Extender: Tonye Pearson Physician: Referring Physician: Doristine Locks in Treatment: 17 Subjective Chief Complaint Information obtained from Patient Mr Fulbright presents today for routine management on his RLE venous ulcer History of Present Illness (HPI) The following HPI elements were documented for the patient's wound: Location: open wound just above his right ankle medially Quality: Patient tells me he is not having a significant amount of pain at this point in time. Severity: 1 out of 10 Duration: This just reopened in the past week. Timing: Pain in wound is Intermittent Context: The wound appeared gradually over time Modifying Factors: Consults to this date include: he was seen in the ER and was referred to a vascular surgeon but the patient has not done that. He may have been treated with clindamycin in the ER. Associated Signs and Symptoms: Patient reports having difficulty standing for long periods. 65 year old gentleman who was seen in the emergency department recently on 01/06/2015 for a wound of his right lower extremity which he says was not involving any injury and he did not know how he sustained it. He had draining foul-smelling liquid from the area and had gone for care there. his past medical history is significant for DVT, hypertension, gout, tobacco abuse, cocaine abuse, stroke, atrial fibrillation, pulmonary embolism. he has also had some vascular surgery with a stent placed in his leg. He has been a smoker for many years  and has given up straight drugs several years ago. He continues to smoke about 4-5 cigarettes a day. 02/03/2015 -- received a note from 05/14/2013 where Dr. Leotis Pain  placed an inferior vena cava filter. The patient had a deep vein thrombosis while therapeutic on anticoagulation for previous DVT and a IVC filter was placed for this. 02/10/2015 -- he did have his vascular test done on Friday but we have no reports yet. 02/17/2015 -- notes were reviewed from the vascular office and the patient had a venous ultrasound done which revealed that he had no reflux in the greater saphenous vein or the short saphenous vein bilaterally. He did have subacute DVT in the common femoral vein and popliteal veins on the right and left side. The recommendation was to continue with Unna's boot therapy at the wound clinic and then to wear graduated compression stockings once the ulcers healed and later if he had continuous problems lymphedema pump would benefit him. KREED, KAUFFMAN ([id number]) 03/17/2015 -- we have applied for his insurance and aide regarding cellular tissue-based products and are still awaiting the final clearance. 03/24/2015 -- he has had Apligraf authorized for him but his wound is looking so good today that we may not use it. 03/31/2015 -- he has not yet received his compression stockings though we have called a couple of times and hopefully they should arrive this week. READMISSION 01/06/16; this is a patient we have previously cared for in this clinic with wounds on his right medial ankle. I was not previously involved with his care. 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 He wears compression stockings. He had reflux studies done during his last stay in this clinic which did not show significant reflux in the greater or lesser saphenous veins bilaterally. His history is that he developed a  open sore on the left medial malleolus one week ago. He was seen in his primary physician office and given a course of doxycycline which he still should be on. Previously seen vascular surgery who felt that he had some degree of lymphedema as well. He is not a diabetic 01/13/16 no major change 01/20/16; very small wound on the medial right ankle again covered with surface slough that doesn't seem to be spotting the Prisma 01/27/16; patient comes in today complaining of a lot of pain around the wound site. He has not been systemically unwell. 02/03/16; the patient's wound culture last week grew Proteus, I had empirically given doxycycline. The Proteus was not specifically plated against doxycycline however Proteus itself was fairly pansensitive and the patient comes back feeling a lot better today. I think the doxycycline was likely to be successful in sufficient 02/10/16; as predicted last week the area has closed over. These are probably venous insufficiency wounds although his previous reflux studies did not show superficial reflux. He also has a history of DVT and at one time had a Greenfield filter in place. The area in question on his left medial ankle region. It became secondarily infected but responded nicely to antibiotics. He is closed today 02/17/16 unfortunately patient's venous wound on the medial aspect of his right ankle at this point in time has reopened. He has been using some compression hose which appear to be very light that he purchased he tells me out of a magazine. He seems a little frustrated with the fact that this has reopened and is concerned about his left lower extremity possibly reopening as well. 02/25/16 patient presents today for follow-up evaluation regarding his right ankle wound. Currently he shows no interval signs  or symptoms of infection. We have been compression wrapping him unfortunately the wraps that we had on him last week and he has a significant amount of  swelling above whether this had slipped down to. He also notes that he's been having some burning as well at the wound site. He rates his discomfort at this point in time to be a 2-3 out of 10. Otherwise he has no other worsening symptoms. 03/03/16; this is a patient that had a wound on his left medial ankle that I discharged on 02/10/16. He apparently reappeared the next week with open areas on his right medial ankle. Her intake nurse reports today that he has a lot of drainage and odor at intake even after the wound was cleaned. Also of note the patient complains of edema in the left leg and showed up with only one of the 2 layer compression system. 03/05/2016 -- since his visit 2 days ago to see Dr. Dellia Nims he complained of significant pain in his right lower extremity which was much more than he's ever had before. He came in for an urgent visit to review his condition. He has been placed on doxycycline empirically and his culture reports were reviewed but the final result is not back. 03/10/16; patient was in last week to see Dr. Con Memos with increasing pain in his leg. He was reduced to a 3 Habermann, Damian ([id number]) layer compression from 4 which seems to have helped overall. Culture from last week grew again pansensitive Proteus, this should've been sensitive to the doxycycline I gave him and he is finishing that today. The patient is had previous arterial and venous review by vascular surgery. Patient is currently using Aquacel Ag under a 3 layer compression. 03/17/16; patient's wound dimensions are down this week. He has been using silver alginate 03/24/2016 - Mr. Dvorsky arrives today for management of RLE venous ulcer. The alginate dressing is densly adhered to the ulcer. He offers no complaints, concerns, or needs. 03/31/16; no real change in the wound measurements post debridement. Using Prisma. If anything the measurements are larger today at 2 x 1 cm post debridement 04-07-16  Mr. Tomei arrives today for management of his right lower extremity venous ulcer. He is voicing no complaints associated with his wound over the last week. He does inquire about need for compression therapy, this appears to be a weekly inquiry. He was advised that compression therapy is indicated throughout the treatment of the wound and he will then transition to compression stockings. He is compliant with compression stockings to the left lower extremity. 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 The base of the wound is healthy we're using Hydrofera Blue. Measurements are smaller 04/21/16; patient has severe chronic venous insufficiency on the right medial lower leg. He is here with a venous insufficiency ulcer in that location. He continues to make progress in terms of wound area. Surface of the wound also appears to have very healthy granulation we have been using Hydrofera Blue and there seems to be very little reason to change. 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 He has an ulcer in his right medial lower leg. We have been making very gradual progress here using Hydrofera Blue for the last several weeks 05/05/16; this patient has severe chronic venous insufficiency. 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 He has a right lower extremity wound. The area is mostly fully epithelialized however there is small area of tightly adherent eschar.  I did not remove this today. It is likely to be healed underneath although I did not prove this today. Be discharging him to [place] on 20-30 mm below-knee stockings Objective Constitutional Patient is hypertensive.. Pulse regular and within target range for patient.[name] Respirations regular, non-labored and within target range.. Temperature is normal and within the target range for the patient.. Patient's appearance is neat and clean. Appears in no acute distress. Well  nourished and well developed.. Vitals Time Taken: 8:14 AM, Height: 74 in, Weight: 240 lbs, BMI: 30.8, Temperature: 98.1 F, Pulse: 70 bpm, Respiratory Rate: 19 breaths/min, Blood Pressure: 167/97 mmHg. Cardiovascular Pedal pulses palpable and strong bilaterally.. Edema present in both extremities. This is mild. Severe hemosiderin deposition. Langhans, [name] ([id number]) General Notes: Wound exam; the patient has a complete surface over this. There is middle part of eschar in the middle of the epithelium. The eschars tightly adherent I don't think that this is going to be a major issue. Integumentary (Hair, Skin) Wound #3 status is Healed - Epithelialized. Original cause of wound was Gradually Appeared. The wound is located on the Right Lower Leg. The wound measures 0cm length x 0cm width x 0cm depth; 0cm^2 area and 0cm^3 volume. The wound is limited to skin breakdown. There is no tunneling or undermining noted. There is a none present amount of drainage noted. The wound margin is flat and intact. There is no granulation within the wound bed. There is no necrotic tissue within the wound bed. The periwound skin appearance exhibited: Scarring, Dry/Scaly, Hemosiderin Staining, Mottled. The periwound skin appearance did not exhibit: Callus, Crepitus, Excoriation, Fluctuance, Friable, Induration, Localized Edema, Rash, Maceration, Moist, Atrophie Blanche, Cyanosis, Ecchymosis, Pallor, Rubor, Erythema. Periwound temperature was noted as No Abnormality. 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 I87.331 - Chronic venous hypertension (idiopathic) with ulcer and inflammation of right lower extremity Plan Discharge From [facility] Services: Wound #3 Right Lower Leg: Discharge from [place] Completed The patinet can be discharged to his own graded pressure stockings which he says are 35 months old  (look older) no specific dressing Mordecai, [name] ([id number]) Electronic Signature(s) Signed: 05/07/2016 8:39:14 AM By: Gretta Cool, RN, [name], [name], [name] Signed: 05/11/2016 3:32:00 PM By: [name] MD Previous Signature: 05/05/2016 5:40:41 PM Version By: [name] MD Entered By: Gretta Cool, RN, [name], Kim on 05/07/2016 08:39:14 Leggette, [name] ([id number]) -------------------------------------------------------------------------------- SuperBill Details [place], Date of Service: 05/05/2016 Patient Name: [facility] Patient Account Number: [id number] Medical Record Treating RN: Baruch Gouty RN, [name], Velva Harman [id number] Number: Other Clinician: Date of Birth/Sex: 09/28/1950 (65 y.o. Male) Treating [name] Primary Care Physician/Extender: Tonye Pearson Physician: Weeks in Treatment: 17 Referring Physician: Royetta Crochet Diagnosis Coding ICD-10 Codes Code Description F68.127 Non-pressure chronic ulcer of right ankle limited to breakdown of skin Chronic venous hypertension (idiopathic) with ulcer and inflammation of right lower I87.331 extremity Facility Procedures CPT4 Code: [id number] Description: 94496 - WOUND CARE VISIT-LEV 2 EST PT Modifier: Quantity: 1 Physician Procedures CPT4: Description Modifier Quantity Code [id number] 46659 - WC PHYS LEVEL 2 - EST PT 1 ICD-10 Description Diagnosis L97.311 Non-pressure chronic ulcer of right ankle limited to breakdown of skin I87.331 Chronic venous hypertension (idiopathic) with ulcer  and inflammation of right lower extremity Electronic Signature(s) Signed: 05/05/2016 5:40:41 PM By: [name] MD Entered By: [name] on 05/05/2016 09:17:28

## 2016-06-16 ENCOUNTER — Other Ambulatory Visit: Payer: Self-pay | Admitting: Internal Medicine

## 2016-06-16 ENCOUNTER — Ambulatory Visit
Admission: RE | Admit: 2016-06-16 | Discharge: 2016-06-16 | Disposition: A | Discharge status: Home / Self Care | Payer: Medicare Other | Source: Ambulatory Visit | Attending: Internal Medicine | Admitting: Internal Medicine

## 2016-06-16 ENCOUNTER — Encounter: Payer: Medicare Other | Attending: Internal Medicine | Admitting: Internal Medicine

## 2016-06-16 DIAGNOSIS — I4891 Unspecified atrial fibrillation: Secondary | ICD-10-CM | POA: Diagnosis not present

## 2016-06-16 DIAGNOSIS — Z8673 Personal history of transient ischemic attack (TIA), and cerebral infarction without residual deficits: Secondary | ICD-10-CM | POA: Insufficient documentation

## 2016-06-16 DIAGNOSIS — L97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: Secondary | ICD-10-CM | POA: Insufficient documentation

## 2016-06-16 DIAGNOSIS — J90 Pleural effusion, not elsewhere classified: Secondary | ICD-10-CM | POA: Insufficient documentation

## 2016-06-16 DIAGNOSIS — I890 Lymphedema, not elsewhere classified: Secondary | ICD-10-CM | POA: Diagnosis not present

## 2016-06-16 DIAGNOSIS — M109 Gout, unspecified: Secondary | ICD-10-CM | POA: Diagnosis not present

## 2016-06-16 DIAGNOSIS — R0602 Shortness of breath: Secondary | ICD-10-CM | POA: Insufficient documentation

## 2016-06-16 DIAGNOSIS — I10 Essential (primary) hypertension: Secondary | ICD-10-CM | POA: Diagnosis not present

## 2016-06-16 DIAGNOSIS — F17210 Nicotine dependence, cigarettes, uncomplicated: Secondary | ICD-10-CM | POA: Insufficient documentation

## 2016-06-16 DIAGNOSIS — I87331 Chronic venous hypertension (idiopathic) with ulcer and inflammation of right lower extremity: Secondary | ICD-10-CM | POA: Diagnosis present

## 2016-06-16 DIAGNOSIS — Z7901 Long term (current) use of anticoagulants: Secondary | ICD-10-CM | POA: Diagnosis not present

## 2016-06-16 DIAGNOSIS — Z86718 Personal history of other venous thrombosis and embolism: Secondary | ICD-10-CM | POA: Diagnosis not present

## 2016-06-16 DIAGNOSIS — I509 Heart failure, unspecified: Secondary | ICD-10-CM | POA: Diagnosis not present

## 2016-06-17 NOTE — Progress Notes (Signed)
[name] ([id number]) Visit Report for 06/16/2016 Allergy List Details Mcclurkin, Date of Service: 06/16/2016 8:00 AM Patient Name: [name] Patient Account Number: [id number] Medical Record Treating RN: Montey Hora [id number] Number: Other Clinician: Date of Birth/Sex: [birth year] (66 y.o. Male) Treating [name] Primary Care Maryclare Nydam: Royetta Crochet Brexton Sofia/Extender: G Referring Ransome Helwig: Royetta Crochet Weeks in Treatment: 0 Allergies Active Allergies No Known Allergies Allergy Notes Electronic Signature(s) Signed: 06/16/2016 4:03:51 PM By: Montey Hora Entered By: Montey Hora on 06/16/2016 08:16:59 Meegan, [name] ([id number]) -------------------------------------------------------------------------------- Arrival Information Details Caicedo, Date of Service: 06/16/2016 8:00 AM Patient Name: [name] Patient Account Number: [id number] Medical Record Treating RN: Montey Hora [id number] Number: Other Clinician: Date of Birth/Sex: [birth year] (66 y.o. Male) Treating [name], [employer] Primary Care Careen Mauch: Royetta Crochet Chevon Laufer/Extender: G Referring Loren Vicens: Doristine Locks in Treatment: 0 Visit Information Patient Arrived: Ambulatory Arrival Time: 08:13 Accompanied By: self Transfer Assistance: None Patient Identification Verified: Yes Secondary Verification Process Yes Completed: Patient Has Alerts: Yes Patient Alerts: Patient on Blood Thinner warfarin History Since Last Visit Added or deleted any medications: No Any new allergies or adverse reactions: No Had a fall or experienced change in activities of daily living that may affect risk of falls: No Signs or symptoms of abuse/neglect since last visito No Hospitalized since last visit: No Has Compression in Place as Prescribed: Yes Electronic Signature(s) Signed: 06/16/2016 4:03:51 PM By: Montey Hora Entered By: Montey Hora on 06/16/2016 08:13:46 [name]  ([id number]) -------------------------------------------------------------------------------- Encounter Discharge Information Details Mcgillicuddy, Date of Service: 06/16/2016 8:00 AM Patient Name: [name] Patient Account Number: [id number] Medical Record Treating RN: Montey Hora [id number] Number: Other Clinician: Date of Birth/Sex: 26-Aug-1950 (66 y.o. Male) Treating [name], [place] Primary Care Dajai Wahlert: Royetta Crochet Imogine Carvell/Extender: G Referring Darrion Wyszynski: Doristine Locks in Treatment: 0 Encounter Discharge Information Items Discharge Pain Level: 0 Discharge Condition: Stable Ambulatory Status: Ambulatory Discharge Destination: Home Transportation: Private Auto Accompanied By: self Schedule Follow-up Appointment: Yes Medication Reconciliation completed and provided to Patient/Care No Rodrigues Urbanek: Provided on Clinical Summary of Care: 06/16/2016 Form Type Recipient Paper Patient WD Electronic Signature(s) Signed: 06/16/2016 3:27:03 PM By: Montey Hora Previous Signature: 06/16/2016 9:31:32 AM Version By: Ruthine Dose Entered By: Montey Hora on 06/16/2016 15:27:03 [name] ([id number]) -------------------------------------------------------------------------------- General Visit Notes Details Shadowens, Date of Service: 06/16/2016 8:00 AM Patient Name: [name] Patient Account Number: [id number] Medical Record Treating RN: Montey Hora [id number] Number: Other Clinician: Date of Birth/Sex: 1951/03/15 (65 y.o. Male) Treating [name] Primary Care Juanette Urizar: Royetta Crochet Mylee Falin/Extender: G Referring Obinna Ehresman: Royetta Crochet Weeks in Treatment: 0 Notes at the end of patient's visit he complained of sob. Dr Dellia Nims notified and came into room to check patient out and ordered a cxr. Electronic Signature(s) Signed: 06/16/2016 4:03:51 PM By: Montey Hora Entered By: Montey Hora on 06/16/2016 09:20:02 [name], [name] -------------------------------------------------------------------------------- Lower Extremity Assessment Details Ng, Date of Service: 06/16/2016 8:00 AM Patient Name: [name] Patient Account Number: [id number] Medical Record Treating RN: Montey Hora [id number] Number: Other Clinician: Date of Birth/Sex: 1950-07-15 (66 y.o. Male) Treating [name], [place] Primary Care Apurva Reily: Royetta Crochet Terrence Pizana/Extender: G Referring Kherington Meraz: Doristine Locks in Treatment: 0 Edema Assessment Assessed: [Left: No] [Right: No] Edema: [Left: Yes] [Right: Yes] Calf Left: Right: Point of Measurement: 38 cm From Medial Instep 44.1 cm 45.2 cm Ankle Left: Right: Point of Measurement: 12 cm From Medial Instep 28.5 cm 29.7 cm Vascular Assessment Pulses: Dorsalis Pedis Palpable: [Left:Yes] [Right:Yes] Doppler Audible: [Left:Yes] [Right:Yes] Posterior Tibial Palpable: [Left:Yes] [Right:Yes] Doppler Audible: [Left:Yes] [Right:Yes] Extremity colors, hair  growth, and conditions: Extremity Color: [Left:Hyperpigmented] [Right:Hyperpigmented] Hair Growth on Extremity: [Left:No] [Right:No] Temperature of Extremity: [Left:Warm] [Right:Warm] Capillary Refill: [Left:< 3 seconds] [Right:< 3 seconds] Blood Pressure: Brachial: [Left:160] [Right:156] Dorsalis Pedis: 156 [Left:Dorsalis Pedis: 162] Ankle: Posterior Tibial: [Left:Posterior Tibial: 0.98] [Right:1.01] Toe Nail Assessment Left: Right: Thick: Yes Yes Discolored: Yes Yes [name], [name] ([id number]) Deformed: No No Improper Length and Hygiene: Yes Yes Electronic Signature(s) Signed: 06/16/2016 4:03:51 PM By: Montey Hora Entered By: Montey Hora on 06/16/2016 08:42:59 [name] ([id number]) -------------------------------------------------------------------------------- Multi Wound Chart Details [name], Date of Service: 06/16/2016 8:00 AM Patient Name: [name] Patient Account Number:  [id number] Medical Record Treating RN: Montey Hora [id number] Number: Other Clinician: Date of Birth/Sex: 21-Jan-1951 (66 y.o. Male) Treating [name], MICHAEL Primary Care Lorriane Dehart: Royetta Crochet Vashon Arch/Extender: G Referring Samanyu Tinnell: Royetta Crochet Weeks in Treatment: 0 Vital Signs Height(in): 74 Pulse(bpm): 57 Weight(lbs): 252 Blood Pressure 168/90 (mmHg): Body Mass Index(BMI): 32 Temperature(F): Respiratory Rate 18 (breaths/min): Photos: [N/A:N/A] Wound Location: Right Lower Leg - Medial N/A N/A Wounding Event: Gradually Appeared N/A N/A Primary Etiology: Venous Leg Ulcer N/A N/A Comorbid History: Arrhythmia, Hypertension, N/A N/A Gout, Neuropathy Date Acquired: 06/02/2016 N/A N/A Weeks of Treatment: 0 N/A N/A Wound Status: Open N/A N/A Measurements L x W x D 2.2x1.5x0.1 N/A N/A (cm) Area (cm) : 2.592 N/A N/A Volume (cm) : 0.259 N/A N/A Classification: Partial Thickness N/A N/A Exudate Amount: Large N/A N/A Exudate Type: Serous N/A N/A Exudate Color: amber N/A N/A Wound Margin: Flat and Intact N/A N/A Granulation Amount: Large (67-100%) N/A N/A Granulation Quality: Red N/A N/A Necrotic Amount: Small (1-33%) N/A N/A Exposed Structures: N/A N/A Harpster, [name] ([id number]) Fascia: No Fat Layer (Subcutaneous Tissue) Exposed: No Tendon: No Muscle: No Joint: No Bone: No Limited to Skin Breakdown Epithelialization: None N/A N/A Debridement: Debridement (24268- N/A N/A 11047) Pre-procedure 08:48 N/A N/A Verification/Time Out Taken: Pain Control: Lidocaine 4% Topical N/A N/A Solution Tissue Debrided: Fibrin/Slough, N/A N/A Subcutaneous Level: Skin/Subcutaneous N/A N/A Tissue Debridement Area (sq 3.3 N/A N/A cm): Instrument: Curette N/A N/A Bleeding: Minimum N/A N/A Hemostasis Achieved: Pressure N/A N/A Procedural Pain: 0 N/A N/A Post Procedural Pain: 0 N/A N/A Debridement Treatment Procedure was tolerated N/A N/A Response: well Post  Debridement 2.2x1.5x0.2 N/A N/A Measurements L x W x D (cm) Post Debridement 0.518 N/A N/A Volume: (cm) Periwound Skin Texture: Excoriation: No N/A N/A Induration: No Callus: No Crepitus: No Rash: No Scarring: No Periwound Skin Maceration: Yes N/A N/A Moisture: Dry/Scaly: No Periwound Skin Color: Atrophie Blanche: No N/A N/A Cyanosis: No Ecchymosis: No Erythema: No Hemosiderin Staining: No Mottled: No Munday, Finch ([id number]) Pallor: No Rubor: No Temperature: No Abnormality N/A N/A Tenderness on Yes N/A N/A Palpation: Wound Preparation: Ulcer Cleansing: N/A N/A Rinsed/Irrigated with Saline Topical Anesthetic Applied: Other: lidocaine 4% Procedures Performed: Debridement N/A N/A Treatment Notes Electronic Signature(s) Signed: 06/16/2016 5:08:44 PM By: [name] MD Entered By: [name] on 06/16/2016 08:56:13 Winkel, [name] ([id number]) -------------------------------------------------------------------------------- Multi-Disciplinary Care Plan Details [place], Date of Service: 06/16/2016 8:00 AM Patient Name: [name] Patient Account Number: [id number] Medical Record Treating RN: Montey Hora [id number] Number: Other Clinician: Date of Birth/Sex: Jan 22, 1951 (66 y.o. Male) Treating [name], MICHAEL Primary Care Emmarae Cowdery: Royetta Crochet Freeda Spivey/Extender: G Referring Dreshawn Hendershott: Doristine Locks in Treatment: 0 Active Inactive ` Orientation to the Wound Care Program Nursing Diagnoses: Knowledge deficit related to the wound healing [name] program Goals: Patient/caregiver will verbalize understanding of the [place] Program Date Initiated: 06/16/2016 Target Resolution Date: 08/15/2016 Goal Status: Active Interventions:  Provide education on orientation to the wound [name] Notes: ` Venous Leg Ulcer Nursing Diagnoses: Potential for venous Insuffiency (use before diagnosis confirmed) Goals: Patient will maintain optimal  edema control Date Initiated: 06/16/2016 Target Resolution Date: 08/19/2016 Goal Status: Active Interventions: Compression as ordered Notes: ` Wound/Skin Impairment Nursing Diagnoses: [name], [name] ([id number]) Impaired tissue integrity Goals: Patient/caregiver will verbalize understanding of skin care regimen Date Initiated: 06/16/2016 Target Resolution Date: 07/15/2016 Goal Status: Active Ulcer/skin breakdown will have a volume reduction of 30% by week 4 Date Initiated: 06/16/2016 Target Resolution Date: 07/15/2016 Goal Status: Active Ulcer/skin breakdown will have a volume reduction of 50% by week 8 Date Initiated: 06/16/2016 Target Resolution Date: 07/15/2016 Goal Status: Active Ulcer/skin breakdown will have a volume reduction of 80% by week 12 Date Initiated: 06/16/2016 Target Resolution Date: 07/15/2016 Goal Status: Active Ulcer/skin breakdown will heal within 14 weeks Date Initiated: 06/16/2016 Target Resolution Date: 07/15/2016 Goal Status: Active Interventions: Assess patient/caregiver ability to obtain necessary supplies Assess patient/caregiver ability to perform ulcer/skin care regimen upon admission and as needed Assess ulceration(s) every visit Notes: Electronic Signature(s) Signed: 06/16/2016 4:03:51 PM By: Montey Hora Entered By: Montey Hora on 06/16/2016 08:52:31 Subramaniam, [name] ([id number]) -------------------------------------------------------------------------------- Pain Assessment Details Wendell, Date of Service: 06/16/2016 8:00 AM Patient Name: [name] Patient Account Number: [id number] Medical Record Treating RN: Montey Hora [id number] Number: Other Clinician: Date of Birth/Sex: 1950/08/26 (66 y.o. Male) Treating [name] Primary Care Sonora Catlin: Royetta Crochet Olegario Emberson/Extender: G Referring Desarie Feild: Doristine Locks in Treatment: 0 Active Problems Location of Pain Severity and Description of Pain Patient Has Paino Yes Site  Locations Pain Location: Pain in Ulcers With Dressing Change: Yes Duration of the Pain. Constant / Intermittento Constant Pain Management and Medication Current Pain Management: Notes Topical or injectable lidocaine is offered to patient for acute pain when surgical debridement is performed. If needed, Patient is instructed to use over the counter pain medication for the following 24-48 hours after debridement. Wound care MDs do not prescribed pain medications. Patient has chronic pain or uncontrolled pain. Patient has been instructed to make an appointment with their Primary Care Physician for pain management. Electronic Signature(s) Signed: 06/16/2016 4:03:51 PM By: Montey Hora Entered By: Montey Hora on 06/16/2016 08:14:00 Salvetti, [name] ([id number]) -------------------------------------------------------------------------------- Patient/Caregiver Education Details [place], Date of Service: 06/16/2016 8:00 AM Patient Name: [name] Patient Account Number: [id number] Medical Record Treating RN: Montey Hora [id number] Number: Other Clinician: Date of Birth/Gender: 08-18-1950 (66 y.o. Male) Treating [name], MICHAEL Primary Care Physician/Extender: Tonye Pearson Physician: Suella Grove in Treatment: 0 Referring Physician: Royetta Crochet Education Assessment Education Provided To: Patient Education Topics Provided Venous: Handouts: Other: leg elevation Methods: Demonstration, Explain/Verbal Responses: State content correctly Electronic Signature(s) Signed: 06/16/2016 4:03:51 PM By: Montey Hora Entered By: Montey Hora on 06/16/2016 15:30:08 Runquist, [name] ([id number]) -------------------------------------------------------------------------------- Wound Assessment Details Karen, Date of Service: 06/16/2016 8:00 AM Patient Name: [name] Patient Account Number: [id number] Medical Record Treating RN: Montey Hora [id number] Number: Other  Clinician: Date of Birth/Sex: June 02, 1950 (66 y.o. Male) Treating [name], [place] Primary Care Ellakate Gonsalves: Royetta Crochet Kirsta Probert/Extender: G Referring Coral Soler: Doristine Locks in Treatment: 0 Wound Status Wound Number: 4 Primary Venous Leg Ulcer Etiology: Wound Location: Right Lower Leg - Medial Wound Status: Open Wounding Event: Gradually Appeared Comorbid Arrhythmia, Hypertension, Gout, Date Acquired: 06/02/2016 History: Neuropathy Weeks Of Treatment: 0 Clustered Wound: No Photos Wound Measurements Length: (cm) 2.2 Width: (cm) 1.5 Depth: (cm) 0.1 Area: (cm) 2.592 Volume: (cm) 0.259 % Reduction in Area: % Reduction in Volume: Epithelialization: None Tunneling:  No Undermining: No Wound Description Classification: Partial Thickness Wound Margin: Flat and Intact Exudate Amount: Large Exudate Type: Serous Exudate Color: amber Foul Odor After Cleansing: No Slough/Fibrino Yes Wound Bed Granulation Amount: Large (67-100%) Exposed Structure Granulation Quality: Red Fascia Exposed: No Necrotic Amount: Small (1-33%) Fat Layer (Subcutaneous Tissue) Exposed: No Czerwinski, Shyloh ([id number]) Necrotic Quality: Adherent Slough Tendon Exposed: No Muscle Exposed: No Joint Exposed: No Bone Exposed: No Limited to Skin Breakdown Periwound Skin Texture Texture Color No Abnormalities Noted: No No Abnormalities Noted: No Callus: No Atrophie Blanche: No Crepitus: No Cyanosis: No Excoriation: No Ecchymosis: No Induration: No Erythema: No Rash: No Hemosiderin Staining: No Scarring: No Mottled: No Pallor: No Moisture Rubor: No No Abnormalities Noted: No Dry / Scaly: No Temperature / Pain Maceration: Yes Temperature: No Abnormality Tenderness on Palpation: Yes Wound Preparation Ulcer Cleansing: Rinsed/Irrigated with Saline Topical Anesthetic Applied: Other: lidocaine 4%, Treatment Notes Wound #4 (Right, Medial Lower Leg) 1. Cleansed with: Clean wound with  Normal Saline 2. Anesthetic Topical Lidocaine 4% cream to wound bed prior to debridement 4. Dressing Applied: Hydrafera Blue 5. Secondary Dressing Applied ABD Pad 7. Secured with 3 Layer Compression System - Right Lower Extremity Electronic Signature(s) Signed: 06/16/2016 4:03:51 PM By: Montey Hora Entered By: Montey Hora on 06/16/2016 08:29:31 Bustamante, [name] ([id number]) -------------------------------------------------------------------------------- Vitals Details Swindle, Date of Service: 06/16/2016 8:00 AM Patient Name: [name] Patient Account Number: [id number] Medical Record Treating RN: Montey Hora [id number] Number: Other Clinician: Date of Birth/Sex: 01/03/1951 (65 y.o. Male) Treating [name], [place] Primary Care Javarie Crisp: Royetta Crochet Daiel Strohecker/Extender: G Referring Sabriyah Wilcher: Doristine Locks in Treatment: 0 Vital Signs Time Taken: 08:14 Temperature (F): 97.5 Height (in): 74 Pulse (bpm): 57 Source: Measured Respiratory Rate (breaths/min): 18 Weight (lbs): 252 Blood Pressure (mmHg): 168/90 Source: Measured Reference Range: 80 - 120 mg / dl Body Mass Index (BMI): 32.4 Pulse Oximetry (%): 98 Electronic Signature(s) Signed: 06/16/2016 4:03:51 PM By: Montey Hora Entered By: Montey Hora on 06/16/2016 09:20:43

## 2016-06-17 NOTE — Progress Notes (Signed)
[name] ([id number]) Visit Report for 06/16/2016 Chief Complaint Document Details [name], Date of Service: 06/16/2016 8:00 AM Patient Name: [name] Patient Account Number: [id number] Medical Record Treating RN: [name] [id number] Number: Other Clinician: Date of Birth/Sex: [birth year] (66 y.o. Male) Treating [name] Primary Care Provider: Royetta [name] Provider/Extender: [name] Referring Provider: Doristine [name] in Treatment: 0 Information Obtained from: Patient Chief Complaint [name] presents today for routine management on his RLE venous ulcer 06/16/16; patient returns today for a recurrent ulcer on the right medial leg exact same location as last time. Electronic Signature(s) Signed: 06/16/2016 5:08:44 PM By: Mark Ham MD Entered By: [name] on 06/16/2016 08:58:10 [name], [name] ([id number]) -------------------------------------------------------------------------------- Debridement Details [name], Date of Service: 06/16/2016 8:00 AM Patient Name: [name] Patient Account Number: [id number] Medical Record Treating RN: [name] [id number] Number: Other Clinician: Date of Birth/Sex: 11/23/1950 (65 y.o. Male) Treating [name], [place] Primary Care Provider: Royetta [name] Provider/Extender: [name] Referring Provider: Doristine [name] in Treatment: 0 Debridement Performed for Wound #4 Right,Medial Lower Leg Assessment: Performed By: Physician Mark Dillon, MD Debridement: Debridement Pre-procedure Yes - 08:48 Verification/Time Out Taken: Start Time: 08:48 [name] Control: Lidocaine 4% Topical Solution Level: Skin/Subcutaneous Tissue Total Area Debrided (L x 2.2 (cm) x 1.5 (cm) = 3.3 (cm) W): Tissue and other Viable, Non-Viable, Fibrin/Slough, Subcutaneous material debrided: Instrument: Curette Bleeding: Minimum Hemostasis Achieved: Pressure End Time: 08:50 Procedural [name]: 0 Post Procedural [name]: 0 Response  to Treatment: Procedure was tolerated well Post Debridement Measurements of Total Wound Length: (cm) 2.2 Width: (cm) 1.5 Depth: (cm) 0.2 Volume: (cm) 0.518 Character of Wound/Ulcer Post Improved Debridement: Severity of Tissue Post Debridement: Fat layer exposed Post Procedure Diagnosis Same as Pre-procedure Electronic Signature(s) Signed: 06/16/2016 4:03:51 PM By: [name] Signed: 06/16/2016 5:08:44 PM By: Mark Ham MD [name], [name] ([id number]) Entered By: [name] on 06/16/2016 08:56:33 [name], [name] ([id number]) -------------------------------------------------------------------------------- HPI Details [name], Date of Service: 06/16/2016 8:00 AM Patient Name: [name] Patient Account Number: [id number] Medical Record Treating RN: [name] [id number] Number: Other Clinician: Date of Birth/Sex: 1950/11/10 (65 y.o. Male) Treating [name] Primary Care Provider: Royetta [name] Provider/Extender: [name] Referring Provider: Royetta [name] Weeks in Treatment: 0 History of Present Illness Location: open wound just above his right ankle medially Quality: Patient tells me he is not having a significant amount of [name] at this [name] in time. Severity: 1 out of 10 Duration: This just reopened in the past week. Timing: [name] in wound is Intermittent Context: The wound appeared gradually over time Modifying Factors: Consults to this date include: he was seen in the ER and was referred to a vascular surgeon but the patient has not done that. He may have been treated with clindamycin in the ER. Associated Signs and Symptoms: Patient reports having difficulty standing for long periods. HPI Description: 66 year old gentleman who was seen in the emergency department recently on 01/06/2015 for a wound of his right lower extremity which he says was not involving any injury and he did not know how he sustained it. He had draining foul-smelling liquid from  the area and had gone for care there. his past medical history is significant for DVT, hypertension, gout, tobacco abuse, cocaine abuse, stroke, atrial fibrillation, pulmonary embolism. he has also had some vascular surgery with a stent placed in his leg. He has been a smoker for many years and has given up straight drugs several years ago. He continues to smoke about 4-5 cigarettes a day. 02/03/2015 -- received a note from 05/14/2013  where [name] placed an inferior vena cava filter. The patient had a deep vein thrombosis while therapeutic on anticoagulation for previous DVT and a IVC filter was placed for this. 02/10/2015 -- he did have his vascular test done on Friday but we have no reports yet. 02/17/2015 -- notes were reviewed from the vascular office and the patient had a venous ultrasound done which revealed that he had no reflux in the greater saphenous vein or the short saphenous vein bilaterally. He did have subacute DVT in the common femoral vein and popliteal veins on the right and left side. The recommendation was to continue with Unna's boot therapy at the wound clinic and then to wear graduated compression stockings once the ulcers healed and later if he had continuous problems lymphedema pump would benefit him. 03/17/2015 -- we have applied for his insurance and aide regarding cellular tissue-based products and are still awaiting the final clearance. 03/24/2015 -- he has had Apligraf authorized for him but his wound is looking so good today that we may not use it. 03/31/2015 -- he has not yet received his compression stockings though we have called a couple of times and hopefully they should arrive this week. [name] ([id number]) 01/06/16; this is a patient we have previously cared for in this clinic with wounds on his right medial ankle. I was not previously involved with his care. He has a history of DVT and is on chronic Coumadin and one [name]  had an inferior vena cava filter I'm not sure if that is still in place. He wears compression stockings. He had reflux studies done during his last stay in this clinic which did not show significant reflux in the greater or lesser saphenous veins bilaterally. His history is that he developed a open sore on the left medial malleolus one week ago. He was seen in his primary physician office and given a course of doxycycline which he still should be on. Previously seen vascular surgery who felt that he had some degree of lymphedema as well. He is not a diabetic 01/13/16 no major change 01/20/16; very small wound on the medial right ankle again covered with surface slough that doesn't seem to be spotting the Prisma 01/27/16; patient comes in today complaining of a lot of [name] around the wound site. He has not been systemically unwell. 02/03/16; the patient's wound culture last week grew Proteus, I had empirically given doxycycline. The Proteus was not specifically plated against doxycycline however Proteus itself was fairly pansensitive and the patient comes back feeling a lot better today. I think the doxycycline was likely to be successful in sufficient 02/10/16; as predicted last week the area has closed over. These are probably venous insufficiency wounds although his previous reflux studies did not show superficial reflux. He also has a history of DVT and at one time had a Greenfield filter in place. The area in question on his left medial ankle region. It became secondarily infected but responded nicely to antibiotics. He is closed today 02/17/16 unfortunately patient's venous wound on the medial aspect of his right ankle at this [name] in time has reopened. He has been using some compression hose which appear to be very light that he purchased he tells me out of a magazine. He seems a little frustrated with the fact that this has reopened and is concerned about his left lower extremity possibly  reopening as well. 02/25/16 patient presents today for follow-up evaluation regarding his right ankle wound. Currently  he shows no interval signs or symptoms of infection. We have been compression wrapping him unfortunately the wraps that we had on him last week and he has a significant amount of swelling above whether this had slipped down to. He also notes that he's been having some burning as well at the wound site. He rates his discomfort at this [name] in time to be a 2-3 out of 10. Otherwise he has no other worsening symptoms. 03/03/16; this is a patient that had a wound on his left medial ankle that I discharged on 02/10/16. He apparently reappeared the next week with open areas on his right medial ankle. Her intake nurse reports today that he has a lot of drainage and odor at intake even after the wound was cleaned. Also of note the patient complains of edema in the left leg and showed up with only one of the 2 layer compression system. 03/05/2016 -- since his visit 2 days ago to see Dr. Dellia Nims he complained of significant [name] in his right lower extremity which was much more than he's ever had before. He came in for an urgent visit to review his condition. He has been placed on doxycycline empirically and his culture reports were reviewed but the final result is not back. 03/10/16; patient was in last week to see Dr. Con Memos with increasing [name] in his leg. He was reduced to a 3 layer compression from 4 which seems to have helped overall. Culture from last week grew again pansensitive Proteus, this should've been sensitive to the doxycycline I gave him and he is finishing that today. The patient is had previous arterial and venous review by vascular surgery. Patient is currently using Aquacel Ag under a 3 layer compression. 03/17/16; patient's wound dimensions are down this week. He has been using silver alginate 03/24/2016 - Mark. Boys arrives today for management of RLE venous ulcer.  The alginate dressing is densly adhered to the ulcer. He offers no complaints, concerns, or needs. JUNIOUS, RAGONE ([id number]) 03/31/16; no real change in the wound measurements post debridement. Using Prisma. If anything the measurements are larger today at 2 x 1 cm post debridement 04-07-16 Mark. Tomei arrives today for management of his right lower extremity venous ulcer. He is voicing no complaints associated with his wound over the last week. He does inquire about need for compression therapy, this appears to be a weekly inquiry. He was advised that compression therapy is indicated throughout the treatment of the wound and he will then transition to compression stockings. He is compliant with compression stockings to the left lower extremity. 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 The base of the wound is healthy we're using Hydrofera Blue. Measurements are smaller 04/21/16; patient has severe chronic venous insufficiency on the right medial lower leg. He is here with a venous insufficiency ulcer in that location. He continues to make progress in terms of wound area. Surface of the wound also appears to have very healthy granulation we have been using Hydrofera Blue and there seems to be very little reason to change. 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 He has an ulcer in his right medial lower leg. We have been making very gradual progress here using Hydrofera Blue for the last several weeks 05/05/16; this patient has severe chronic venous insufficiency. 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 He has a right lower extremity wound. The area is mostly fully epithelialized however there is small area  of tightly adherent eschar. I did not remove this today. It is likely to be healed underneath although I did not prove this today. discharging him to [place] on 20-30 mm below-knee  stockings READMISSION 06/16/16; this is a patient who is well known to this clinic. He has severe chronic venous insufficiency with venous inflammation and recurrent wounds predominantly on the right medial leg. He had venous reflux studies in 2016 that did not show significant superficial vein reflux in the greater or lesser saphenous veins bilaterally. He is compliant as far as I know with his compression stockings and BMI notes on 05/05/16 we discharged him on 20-30 mm below-knee stockings. I had also previously discharged him in September 2017 only to have recurrence in the same area. He does not have significant arterial insufficiency with a normal ABI on the right at 1.01. Nevertheless when we used 4 layer compression during his stay here in November 17 he complained of [name] which seemed to have abated with reduction to 3 lower compression therefore that's what we are using. I think it is going to be reasonable to repeat the reflux studies at this [name]. 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 At one [name] he has an IVC filter. I believe this is still in place. His last [name] studies were in 2016. At that [name] vascular surgery recommended compression. He is felt to have some degree of lymphedema. I believe the patient is compliant with his stockings. He does not give an obvious source to the opening of this wound he simply states he discovered it while removing his stockings. No trauma. Patient still smokes 4-5 cigarettes a day before he left the clinic he complained of shortness of breath, he is not complaining of chest [name] or pleuritic chest [name] no cough Electronic Signature(s) Signed: 06/16/2016 5:08:44 PM By: Mark Ham MD Entered By: [name] on 06/16/2016 09:16:58 [name], [name] ([id number]) -------------------------------------------------------------------------------- Physical Exam Details Patient Name:  [name] Date of Service: 06/16/2016 8:00 AM Medical Record Number: [id number] Patient Account Number: [id number] Date of Birth/Sex: [birth year] (66 y.o. Male) Treating RN: Primary Care Provider: Royetta [name] Other Clinician: Referring Provider: Royetta [name] Treating Provider/Extender: Weeks in Treatment: 0 Constitutional Patient is hypertensive.[name] Heart rate irregular.[name] Respirations regular, non-labored and within target range.. Temperature was not initially record. She does not look in overt distress. Eyes Conjunctivae clear. No discharge.[name] Respiratory Respiratory effort is easy and symmetric bilaterally. Rate is normal at rest and on room air.. Course left lower lobe inspiratory crackles no wheezing his work of breathing does not appear to be excessive. Cardiovascular Sounds regular no murmurs no S3 JVP is not elevated. Pedal pulses palpable and strong bilaterally.Erasmo Leventhal on the left leg. Right leg has edema but no warmth or tenderness. There is what I think is significant hemosiderin deposition around the wound on the medial leg this is chronic. I see no signs of a DVT here.. Lymphatic Nonpalpable in the popliteal or inguinal area. Integumentary (Hair, Skin) No rashes seen hemosiderin deposition in the right lower leg compatible with chronic venous insufficiency. Psychiatric No changes from what I'm used to seeing in this patient. Notes Wound exam; the area again is almost in the same position on the right medial lower leg. Thick necrotic surface to ride in with a #3 curet. The base of this appears healthy after debridement therefore am going to try to go back to what seemed to heal and the last time which is Hydrofera  Blue. He has edema in the right leg which again I think is chronic venous insufficiency. No evidence of a DVT. It seems for a long time we had a minimal 4-layer wrap but then he didn't tolerate it and was changed to a 3 layer wrap. He healed out in a  3 layer wrap. I am not sure of the compression of his existing stockings Electronic Signature(s) Signed: 06/16/2016 5:08:44 PM By: Mark Ham MD Entered By: [name] on 06/16/2016 09:24:17 [name], [name] ([id number]) -------------------------------------------------------------------------------- Physician Orders Details [place], Date of Service: 06/16/2016 8:00 AM Patient Name: [name] Patient Account Number: [id number] Medical Record Treating RN: [name] [id number] Number: Other Clinician: Date of Birth/Sex: May 12, 1951 (65 y.o. Male) Treating Shelitha Magley Primary Care Provider: Royetta [name] Provider/Extender: [name] Referring Provider: Doristine [name] in Treatment: 0 Verbal / Phone Orders: Yes Clinician: Montey [name] Read Back and Verified: Yes Diagnosis Coding Wound Cleansing Wound #4 Right,Medial Lower Leg o Clean wound with Normal Saline. o May shower with protection. Anesthetic Wound #4 Right,Medial Lower Leg o Topical Lidocaine 4% cream applied to wound bed prior to debridement Skin Barriers/Peri-Wound Care Wound #4 Right,Medial Lower Leg o Triamcinolone Acetonide Ointment Primary Wound Dressing Wound #4 Right,Medial Lower Leg o Hydrafera Blue Secondary Dressing Wound #4 Right,Medial Lower Leg o ABD pad Dressing Change Frequency Wound #4 Right,Medial Lower Leg o Change dressing every week Follow-up Appointments Wound #4 Right,Medial Lower Leg o Return Appointment in 1 week. o Nurse Visit as needed Edema Control Wound #4 Right,Medial Lower Leg o 3 Layer Compression System - Right Lower Extremity Kia, [place] ([id number]) Additional Orders / Instructions Wound #4 Right,Medial Lower Leg o Increase protein intake. o Other: - please add vitamin c, vitamin a and zinc supplements to your diet Radiology o X-ray, Chest Electronic Signature(s) Signed: 06/16/2016 4:03:51 PM By: [name] Signed:  06/16/2016 5:08:44 PM By: Mark Ham MD Entered By: [name] on 06/16/2016 09:20:26 [name], [name] ([id number]) -------------------------------------------------------------------------------- Problem List Details [place], Date of Service: 06/16/2016 8:00 AM Patient Name: [facility] Patient Account Number: [id number] Medical Record Treating RN: [name] [id number] Number: Other Clinician: Date of Birth/Sex: 1950-07-15 (65 y.o. 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 I87.331 Chronic venous hypertension (idiopathic) with ulcer and 06/16/2016 Yes inflammation of right lower extremity I89.0 Lymphedema, not elsewhere classified 06/16/2016 Yes Inactive Problems Resolved Problems Electronic Signature(s) Signed: 06/16/2016 5:08:44 PM By: Mark Ham MD Entered By: [name] on 06/16/2016 09:29:44 [name], [name] ([id number]) -------------------------------------------------------------------------------- Progress Note Details [name], Date of Service: 06/16/2016 8:00 AM Patient Name: [name] Patient Account Number: [id number] Medical Record Treating RN: [name] [id number] Number: Other Clinician: Date of Birth/Sex: October 30, 1950 (66 y.o. Male) Treating [name] Primary Care Provider: Royetta [name] Provider/Extender: [name] Referring Provider: Doristine [name] in Treatment: 0 Subjective Chief Complaint Information obtained from Patient Mark Clauson presents today for routine management on his RLE venous ulcer 06/16/16; patient returns today for a recurrent ulcer on the right medial leg exact same location as last time. History of Present Illness (HPI) The following HPI elements  were documented for the patient's wound: Location: open wound just above his right ankle medially Quality: Patient tells me he is not having a significant amount of [name] at this [name] in time. Severity: 1 out of 10 Duration: This just reopened in the  past week. Timing: [name] in wound is Intermittent Context: The wound appeared gradually over time Modifying Factors: Consults to this date include: he was seen in the ER and was referred to a vascular surgeon but the patient has not done that. He may have been treated with clindamycin in the ER. Associated Signs and Symptoms: Patient reports having difficulty standing for long periods. 66 year old gentleman who was seen in the emergency department recently on 01/06/2015 for a wound of his right lower extremity which he says was not involving any injury and he did not know how he sustained it. He had draining foul-smelling liquid from the area and had gone for care there. his past medical history is significant for DVT, hypertension, gout, tobacco abuse, cocaine abuse, stroke, atrial fibrillation, pulmonary embolism. he has also had some vascular surgery with a stent placed in his leg. He has been a smoker for many years and has given up straight drugs several years ago. He continues to smoke about 4-5 cigarettes a day. 02/03/2015 -- received a note from 05/14/2013 where [name] placed an inferior vena cava filter. The patient had a deep vein thrombosis while therapeutic on anticoagulation for previous DVT and a IVC filter was placed for this. 02/10/2015 -- he did have his vascular test done on Friday but we have no reports yet. 02/17/2015 -- notes were reviewed from the vascular office and the patient had a venous ultrasound done which revealed that he had no reflux in the greater saphenous vein or the short saphenous vein bilaterally. He did have subacute DVT in the common femoral vein and popliteal veins on the right and left side.  The recommendation was to continue with Unna's boot therapy at the wound clinic and then to wear graduated compression stockings once the ulcers healed and later if he had continuous problems lymphedema pump would benefit him. DENARD, TUMINELLO ([id number]) 03/17/2015 -- we have applied for his insurance and aide regarding cellular tissue-based products and are still awaiting the final clearance. 03/24/2015 -- he has had Apligraf authorized for him but his wound is looking so good today that we may not use it. 03/31/2015 -- he has not yet received his compression stockings though we have called a couple of times and hopefully they should arrive this week. READMISSION 01/06/16; this is a patient we have previously cared for in this clinic with wounds on his right medial ankle. I was not previously involved with his care. He has a history of DVT and is on chronic Coumadin and one [name] had an inferior vena cava filter I'm not sure if that is still in place. He wears compression stockings. He had reflux studies done during his last stay in this clinic which did not show significant reflux in the greater or lesser saphenous veins bilaterally. His history is that he developed a open sore on the left medial malleolus one week ago. He was seen in his primary physician office and given a course of doxycycline which he still should be on. Previously seen vascular surgery who felt that he had some degree of lymphedema as well. He is not a diabetic 01/13/16 no major change 01/20/16; very small wound on the medial right ankle again covered with surface slough that doesn't seem to be spotting the Prisma 01/27/16; patient comes in today complaining of a lot of [name] around the wound site. He has not been systemically unwell. 02/03/16; the patient's wound culture last week grew Proteus, I had empirically given doxycycline. The  Proteus was not specifically plated against doxycycline however Proteus itself was  fairly pansensitive and the patient comes back feeling a lot better today. I think the doxycycline was likely to be successful in sufficient 02/10/16; as predicted last week the area has closed over. These are probably venous insufficiency wounds although his previous reflux studies did not show superficial reflux. He also has a history of DVT and at one time had a Greenfield filter in place. The area in question on his left medial ankle region. It became secondarily infected but responded nicely to antibiotics. He is closed today 02/17/16 unfortunately patient's venous wound on the medial aspect of his right ankle at this [name] in time has reopened. He has been using some compression hose which appear to be very light that he purchased he tells me out of a magazine. He seems a little frustrated with the fact that this has reopened and is concerned about his left lower extremity possibly reopening as well. 02/25/16 patient presents today for follow-up evaluation regarding his right ankle wound. Currently he shows no interval signs or symptoms of infection. We have been compression wrapping him unfortunately the wraps that we had on him last week and he has a significant amount of swelling above whether this had slipped down to. He also notes that he's been having some burning as well at the wound site. He rates his discomfort at this [name] in time to be a 2-3 out of 10. Otherwise he has no other worsening symptoms. 03/03/16; this is a patient that had a wound on his left medial ankle that I discharged on 02/10/16. He apparently reappeared the next week with open areas on his right medial ankle. Her intake nurse reports today that he has a lot of drainage and odor at intake even after the wound was cleaned. Also of note the patient complains of edema in the left leg and showed up with only one of the 2 layer compression system. 03/05/2016 -- since his visit 2 days ago to see Dr. Dellia Nims he complained  of significant [name] in his right lower extremity which was much more than he's ever had before. He came in for an urgent visit to review his condition. He has been placed on doxycycline empirically and his culture reports were reviewed but the final result is not back. 03/10/16; patient was in last week to see Dr. Con Memos with increasing [name] in his leg. He was reduced to a 3 Khatib, Jayshon ([id number]) layer compression from 4 which seems to have helped overall. Culture from last week grew again pansensitive Proteus, this should've been sensitive to the doxycycline I gave him and he is finishing that today. The patient is had previous arterial and venous review by vascular surgery. Patient is currently using Aquacel Ag under a 3 layer compression. 03/17/16; patient's wound dimensions are down this week. He has been using silver alginate 03/24/2016 - Mark. Novacek arrives today for management of RLE venous ulcer. The alginate dressing is densly adhered to the ulcer. He offers no complaints, concerns, or needs. 03/31/16; no real change in the wound measurements post debridement. Using Prisma. If anything the measurements are larger today at 2 x 1 cm post debridement 04-07-16 Mark. Tomei arrives today for management of his right lower extremity venous ulcer. He is voicing no complaints associated with his wound over the last week. He does inquire about need for compression therapy, this appears to be a weekly inquiry. He was advised that compression therapy is  indicated throughout the treatment of the wound and he will then transition to compression stockings. He is compliant with compression stockings to the left lower extremity. 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 The base of the wound is healthy we're using Hydrofera Blue. Measurements are smaller 04/21/16; patient has severe chronic venous insufficiency on the right medial lower leg. He is  here with a venous insufficiency ulcer in that location. He continues to make progress in terms of wound area. Surface of the wound also appears to have very healthy granulation we have been using Hydrofera Blue and there seems to be very little reason to change. 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 He has an ulcer in his right medial lower leg. We have been making very gradual progress here using Hydrofera Blue for the last several weeks 05/05/16; this patient has severe chronic venous insufficiency. 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 He has a right lower extremity wound. The area is mostly fully epithelialized however there is small area of tightly adherent eschar. I did not remove this today. It is likely to be healed underneath although I did not prove this today. discharging him to [place] on 20-30 mm below-knee stockings READMISSION 06/16/16; this is a patient who is well known to this clinic. He has severe chronic venous insufficiency with venous inflammation and recurrent wounds predominantly on the right medial leg. He had venous reflux studies in 2016 that did not show significant superficial vein reflux in the greater or lesser saphenous veins bilaterally. He is compliant as far as I know with his compression stockings and BMI notes on 05/05/16 we discharged him on 20-30 mm below-knee stockings. I had also previously discharged him in September 2017 only to have recurrence in the same area. He does not have significant arterial insufficiency with a normal ABI on the right at 1.01. Nevertheless when we used 4 layer compression during his stay here in November 17 he complained of [name] which seemed to have abated with reduction to 3 lower compression therefore that's what we are using. I think it is going to be reasonable to repeat the reflux studies at this [name]. The patient has a history of recurrent DVT including DVT while adequately  anticoagulated. At one [name] he has an IVC filter. I believe this is still in place. His last [name] studies were in 2016. At that [name] vascular surgery recommended compression. He is felt to have some degree of lymphedema. I believe the patient is compliant with his stockings. He does not give an obvious source to the opening of this wound he simply states he discovered it while removing his stockings. No trauma. Patient still smokes 4-5 cigarettes a day before he left the clinic he complained of shortness of breath, he is not complaining of chest [name] or pleuritic chest [name] no cough Rothert, Garland ([id number]) Wound History Patient presents with 1 open wound that has been present for approximately 2 weeks. Patient has been treating wound in the following manner: bandage. The wound has been healed in the past but has re- opened. Laboratory tests have not been performed in the last month. Patient reportedly has not tested positive for an antibiotic resistant organism. Patient reportedly has not tested positive for osteomyelitis. Patient reportedly has had testing performed to evaluate circulation in the legs. Patient experiences the following problems associated with their wounds: swelling. Patient History Information obtained from Patient. Allergies No Known Allergies Family History Heart  Disease - Mother, Father, Hypertension - Mother, Father, Stroke - Father, No family history of Cancer, Diabetes, Hereditary Spherocytosis, Thyroid Problems, Tuberculosis. Social History Current every day smoker, Marital Status - Single, Alcohol Use - Never, Drug Use - No History, Caffeine Use - Daily. Medical History Hospitalization/Surgery History - 05/17/2012, [facility], CVA. Medical And Surgical History Notes Ear/Nose/Mouth/Throat difficulty speaking Neurologic CVA in 2014 Review of Systems (ROS) Constitutional Symptoms (General Health) The patient has no complaints or symptoms. Eyes The patient  has no complaints or symptoms. Ear/Nose/Mouth/Throat The patient has no complaints or symptoms. Hematologic/Lymphatic The patient has no complaints or symptoms. Respiratory The patient has no complaints or symptoms. Gastrointestinal The patient has no complaints or symptoms. Endocrine The patient has no complaints or symptoms. Genitourinary The patient has no complaints or symptoms. Mizer, [name] ([id number]) Immunological The patient has no complaints or symptoms. Integumentary (Skin) The patient has no complaints or symptoms. Psychiatric The patient has no complaints or symptoms. Objective Constitutional Patient is hypertensive.[name] Heart rate irregular.[name] Respirations regular, non-labored and within target range.. Temperature was not initially record. She does not look in overt distress. Vitals Time Taken: 8:14 AM, Height: 74 in, Source: Measured, Weight: 252 lbs, Source: Measured, BMI: 32.4, Temperature: 97.5 F, Pulse: 57 bpm, Respiratory Rate: 18 breaths/min, Blood Pressure: 168/90 mmHg, Pulse Oximetry: 98 %. Eyes Conjunctivae clear. No discharge.[name] Respiratory Respiratory effort is easy and symmetric bilaterally. Rate is normal at rest and on room air.. Course left lower lobe inspiratory crackles no wheezing his work of breathing does not appear to be excessive. Cardiovascular Sounds regular no murmurs no S3 JVP is not elevated. Pedal pulses palpable and strong bilaterally.Erasmo Leventhal on the left leg. Right leg has edema but no warmth or tenderness. There is what I think is significant hemosiderin deposition around the wound on the medial leg this is chronic. I see no signs of a DVT here.. Lymphatic Nonpalpable in the popliteal or inguinal area. Psychiatric No changes from what I'm used to seeing in this patient. General Notes: Wound exam; the area again is almost in the same position on the right medial lower leg. Thick necrotic surface to ride in with a #3 curet.  The base of this appears healthy after debridement therefore am going to try to go back to what seemed to heal and the last time which is Hydrofera Blue. He has edema in the right leg which again I think is chronic venous insufficiency. No evidence of a DVT. It seems for a long time we had a minimal 4-layer wrap but then he didn't tolerate it and was changed to a 3 layer wrap. He healed out in a 3 layer wrap. I am not sure of the compression of his existing stockings Liera, Trayce ([id number]) Integumentary (Hair, Skin) No rashes seen hemosiderin deposition in the right lower leg compatible with chronic venous insufficiency. Wound #4 status is Open. Original cause of wound was Gradually Appeared. The wound is located on the Right,Medial Lower Leg. The wound measures 2.2cm length x 1.5cm width x 0.1cm depth; 2.592cm^2 area and 0.259cm^3 volume. The wound is limited to skin breakdown. There is no tunneling or undermining noted. There is a large amount of serous drainage noted. The wound margin is flat and intact. There is large (67-100%) red granulation within the wound bed. There is a small (1-33%) amount of necrotic tissue within the wound bed including Adherent Slough. The periwound skin appearance exhibited: Maceration. The periwound skin appearance did not exhibit: Callus, Crepitus, Excoriation, Induration,  Rash, Scarring, Dry/Scaly, Atrophie Blanche, Cyanosis, Ecchymosis, Hemosiderin Staining, Mottled, Pallor, Rubor, Erythema. Periwound temperature was noted as No Abnormality. The periwound has tenderness on palpation. 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 There was a  Skin/Subcutaneous Tissue Debridement ([id number]) debridement with total area of 3.3 sq cm performed by Mark Dillon, MD. with the following instrument(s): Curette to remove Viable and Non-Viable tissue/material including Fibrin/Slough and Subcutaneous after achieving [name] control using Lidocaine 4% Topical Solution. A time out was conducted at 08:48, prior to the start of the procedure. A Minimum amount of bleeding was controlled with Pressure. The procedure was tolerated well with a [name] level of 0 throughout and a [name] level of 0 following the procedure. Post Debridement Measurements: 2.2cm length x 1.5cm width x 0.2cm depth; 0.518cm^3 volume. Character of Wound/Ulcer Post Debridement is improved. Severity of Tissue Post Debridement is: Fat layer exposed. Post procedure Diagnosis Wound #4: Same as Pre-Procedure Collar, Ezrael ([id number]) Plan Wound Cleansing: Wound #4 Right,Medial Lower Leg: Clean wound with Normal Saline. May shower with protection. Anesthetic: Wound #4 Right,Medial Lower Leg: Topical Lidocaine 4% cream applied to wound bed prior to debridement Skin Barriers/Peri-Wound Care: Wound #4 Right,Medial Lower Leg: Triamcinolone Acetonide Ointment Primary Wound Dressing: Wound #4 Right,Medial Lower Leg: Hydrafera Blue Secondary Dressing: Wound #4 Right,Medial Lower Leg: ABD pad Dressing Change Frequency: Wound #4 Right,Medial Lower Leg: Change dressing every week Follow-up Appointments: Wound #4 Right,Medial Lower Leg: Return Appointment in 1 week. Nurse Visit as needed Edema Control: Wound #4 Right,Medial Lower Leg: 3 Layer Compression System - Right Lower Extremity Additional Orders / Instructions: Wound #4 Right,Medial Lower Leg: Increase protein intake. Other: - please add vitamin c, vitamin a and zinc supplements to your diet Radiology ordered were: X-ray, Chest o Hada, Hilmar ([id number]) #1 new wound in the same area of the right  medial lower leg. I will need to review his previous reflux studies although they were not "it is showing significant reflux in either the greater or lesser saphenous vein. #2 it is possible that we simply need to increase his compression to new 30-40 mm stockings #3 he has a history of recurrent DVTs and is on Coumadin with an IVC filter I see no evidence of an acute DVT in the right lower leg #4 we are going to dress the wound with the same thing that seemed to help but last time which is [facility], we had problems with other dressings sticking to the wound especially alginates #5 he complained of shortness of breath late in that stay here. I'm going to send him for a chest x-ray and see if we can set him up with his primary physician he is not febrile with a temperature of 97.5 pulse ox was 98 on room air respirations 18.he states this has been present for several days, not clearly exertional or fluctuating Electronic Signature(s) Signed: 06/16/2016 9:30:34 AM By: Mark Ham MD Entered By: [name] on 06/16/2016 09:30:34 Kestenbaum, [name] ([id number]) -------------------------------------------------------------------------------- ROS/PFSH Details Brisendine, Date of Service: 06/16/2016 8:00 AM Patient Name: [name] Patient Account Number: [id number] Medical Record Treating RN: [name] [id number] Number: Other Clinician: Date of Birth/Sex: July 31, 1950 (66 y.o. Male) Treating [name] Primary Care Provider: Royetta [name] Provider/Extender: Darnell Level  Referring Provider: Doristine [name] in Treatment: 0 Information Obtained From Patient Wound History Do you currently have one or more open woundso Yes How many open wounds do you currently haveo 1 Approximately how long have you had your woundso 2 weeks How have you been treating your wound(s) until nowo bandage Has your wound(s) ever healed and then re-openedo Yes Have you had any lab work done in the past  montho No Have you tested positive for an antibiotic resistant organism (MRSA, VRE)o No Have you tested positive for osteomyelitis (bone infection)o No Have you had any tests for circulation on your legso Yes Who ordered the testo [facility] Where was the test doneo [facility] Have you had other problems associated with your woundso Swelling Constitutional Symptoms (General Health) Complaints and Symptoms: No Complaints or Symptoms Eyes Complaints and Symptoms: No Complaints or Symptoms Ear/Nose/Mouth/Throat Complaints and Symptoms: No Complaints or Symptoms Medical History: Past Medical History Notes: difficulty speaking Hematologic/Lymphatic Complaints and Symptoms: No Complaints or Symptoms Vanwingerden, Bain ([id number]) Respiratory Complaints and Symptoms: No Complaints or Symptoms Cardiovascular Medical History: Positive for: Arrhythmia - a fib; Hypertension Gastrointestinal Complaints and Symptoms: No Complaints or Symptoms Endocrine Complaints and Symptoms: No Complaints or Symptoms Genitourinary Complaints and Symptoms: No Complaints or Symptoms Immunological Complaints and Symptoms: No Complaints or Symptoms Integumentary (Skin) Complaints and Symptoms: No Complaints or Symptoms Musculoskeletal Medical History: Positive for: Gout Neurologic Medical History: Positive for: Neuropathy Past Medical History Notes: CVA in 2014 Oncologic Medical History: Negative for: Received Chemotherapy; Received Radiation Psychiatric KALIEB, FREELAND ([id number]) Complaints and Symptoms: No Complaints or Symptoms Immunizations Immunization Notes: up to date Hospitalization / Surgery History Name of [name] Purpose of Hospitalization/Surgery Date [place] CVA 05/17/2012 Family and Social History Cancer: No; Diabetes: No; Heart Disease: Yes - Mother, Father; Hereditary Spherocytosis: No; Hypertension: Yes - Mother, Father; Stroke: Yes - Father; Thyroid Problems: No;  Tuberculosis: No; Current every day smoker; Marital Status - Single; Alcohol Use: Never; Drug Use: No History; Caffeine Use: Daily; Financial Concerns: No; Food, Clothing or Shelter Needs: No; Support System Lacking: No; Transportation Concerns: No; Advanced Directives: No; Patient does not want information on Advanced Directives Electronic Signature(s) Signed: 06/16/2016 4:03:51 PM By: [name] Signed: 06/16/2016 5:08:44 PM By: Mark Ham MD Entered By: [name] on 06/16/2016 08:18:34 Tineo, [name] ([id number]) -------------------------------------------------------------------------------- SuperBill Details Bogden, Date of Service: 06/16/2016 Patient Name: [facility] Patient Account Number: [id number] Medical Record Treating RN: [name] [id number] Number: Other Clinician: Date of Birth/Sex: [birth year] (66 y.o. Male) Treating Tiwanna Tuch Primary Care Provider: Royetta [name] Provider/Extender: [name] Referring Provider: Doristine [name] in Treatment: 0 Diagnosis Coding ICD-10 Codes Code Description [phone] Non-pressure chronic ulcer of right calf with necrosis of muscle Chronic venous hypertension (idiopathic) with ulcer and inflammation of right lower I87.331 extremity Facility Procedures CPT4 Code Description: [id number] 11042 - DEB SUBQ TISSUE 20 SQ CM/< ICD-10 Description Diagnosis L97.213 Non-pressure chronic ulcer of right calf with necro Modifier: sis of muscle Quantity: 1 Physician Procedures CPT4: Description Modifier Quantity Code [id number] - WC PHYS LEVEL 4 - EST PT 25 1 ICD-10 Description Diagnosis L97.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

## 2016-06-17 NOTE — Progress Notes (Signed)
Mark, [name] ([id number]) Visit Report for 06/16/2016 Abuse/Suicide Risk Screen Details [place], Date of Service: 06/16/2016 8:00 AM Patient Name: [name] Patient Account Number: [id number] Medical Record Treating RN: Montey Hora [id number] Number: Other Clinician: Date of Birth/Sex: [birth year] (66 y.o. Male) Treating [name], MICHAEL Primary Care Jomo Forand: Royetta Crochet Donisha Hoch/Extender: G Referring Nalea Salce: Royetta Crochet Weeks in Treatment: 0 Abuse/Suicide Risk Screen Items Answer ABUSE/SUICIDE RISK SCREEN: Has anyone close to you tried to hurt or harm you recentlyo No Do you feel uncomfortable with anyone in your familyo No Has anyone forced you do things that you didnot want to doo No Do you have any thoughts of harming yourselfo No Patient displays signs or symptoms of abuse and/or neglect. No Electronic Signature(s) Signed: 06/16/2016 4:03:51 PM By: Montey Hora Entered By: Montey Hora on 06/16/2016 08:18:45 [place], [name] ([id number]) -------------------------------------------------------------------------------- Activities of Daily Living Details [place], Date of Service: 06/16/2016 8:00 AM Patient Name: [name] Patient Account Number: [id number] Medical Record Treating RN: Montey Hora [id number] Number: Other Clinician: Date of Birth/Sex: [birth year] (65 y.o. Male) Treating Dellia Nims, [place] Primary Care Shed Nixon: Royetta Crochet Lilliemae Fruge/Extender: G Referring Juliane Guest: Doristine Locks in Treatment: 0 Activities of Daily Living Items Answer Activities of Daily Living (Please select one for each item) Drive Automobile Completely Able Take Medications Completely Able Use Telephone Completely Able Care for Appearance Completely Able Use Toilet Completely Able Bath / Shower Completely Able Dress Self Completely Able Feed Self Completely Able Walk Completely Able Get In / Out Bed Completely Able Housework Completely Able Prepare Meals  Completely [place] for Self Completely Able Electronic Signature(s) Signed: 06/16/2016 4:03:51 PM By: Montey Hora Entered By: Montey Hora on 06/16/2016 08:18:59 Emberson, [name] ([id number]) -------------------------------------------------------------------------------- Education Assessment Details [place], Date of Service: 06/16/2016 8:00 AM Patient Name: [name] Patient Account Number: [id number] Medical Record Treating RN: Montey Hora [id number] Number: Other Clinician: Date of Birth/Sex: January 25, 1951 (66 y.o. Male) Treating [name], MICHAEL Primary Care Kiyomi Pallo: Royetta Crochet Latonya Knight/Extender: G Referring Eliany Mccarter: Doristine Locks in Treatment: 0 Primary Learner Assessed: Patient Learning Preferences/Education Level/Primary Language Learning Preference: Explanation, Demonstration Highest Education Level: High School Preferred Language: English Cognitive Barrier Assessment/Beliefs Language Barrier: No Translator Needed: No Memory Deficit: No Emotional Barrier: No Cultural/Religious Beliefs Affecting Medical No Care: Physical Barrier Assessment Impaired Vision: No Impaired Hearing: No Decreased Hand dexterity: No Knowledge/Comprehension Assessment Knowledge Level: Medium Comprehension Level: Medium Ability to understand written Medium instructions: Ability to understand verbal Medium instructions: Motivation Assessment Anxiety Level: Calm Cooperation: Cooperative Education Importance: Acknowledges Need Interest in Health Problems: Asks Questions Perception: Coherent Willingness to Engage in Self- Medium Management Activities: Readiness to Engage in Self- Medium Management Activities: AMOGH, KOMATSU ([id number]) Electronic Signature(s) Signed: 06/16/2016 4:03:51 PM By: Montey Hora Entered By: Montey Hora on 06/16/2016 08:19:22 Wandel, [name]  ([id number]) -------------------------------------------------------------------------------- Fall Risk Assessment Details [place], Date of Service: 06/16/2016 8:00 AM Patient Name: [name] Patient Account Number: [id number] Medical Record Treating RN: Montey Hora [id number] Number: Other Clinician: Date of Birth/Sex: 03/03/1951 (66 y.o. Male) Treating [name], MICHAEL Primary Care Yeily Link: Royetta Crochet Zanyah Lentsch/Extender: G Referring Bracy Pepper: Doristine Locks in Treatment: 0 Fall Risk Assessment Items Have you had 2 or more falls in the last 12 monthso 0 No Have you had any fall that resulted in injury in the last 12 monthso 0 No FALL RISK ASSESSMENT: History of falling - immediate or within 3 months 0 No Secondary diagnosis 0 No Ambulatory aid None/bed rest/wheelchair/nurse 0 Yes Crutches/cane/walker 0 No Furniture 0 No IV Access/Saline Lock 0  No Gait/Training Normal/bed rest/immobile 0 Yes Weak 0 No Impaired 0 No Mental Status Oriented to own ability 0 Yes Electronic Signature(s) Signed: 06/16/2016 4:03:51 PM By: Montey Hora Entered By: Montey Hora on 06/16/2016 08:19:33 Acebo, [name] ([id number]) -------------------------------------------------------------------------------- Foot Assessment Details [place], Date of Service: 06/16/2016 8:00 AM Patient Name: [name] Patient Account Number: [id number] Medical Record Treating RN: Montey Hora [id number] Number: Other Clinician: Date of Birth/Sex: [birth year] (66 y.o. Male) Treating [name], MICHAEL Primary Care Raykwon Hobbs: Royetta Crochet Chesnee Floren/Extender: G Referring Lynita Groseclose: Doristine Locks in Treatment: 0 Foot Assessment Items Site Locations + = Sensation present, - = Sensation absent, C = Callus, U = Ulcer R = Redness, W = Warmth, M = Maceration, PU = Pre-ulcerative lesion F = Fissure, S = Swelling, D = Dryness Assessment Right: Left: Other Deformity: No No Prior Foot Ulcer: No  No Prior Amputation: No No Charcot Joint: No No Ambulatory Status: Ambulatory Without Help Gait: Steady Electronic Signature(s) Signed: 06/16/2016 4:03:51 PM By: Montey Hora Entered By: Montey Hora on 06/16/2016 08:20:02 Lux, [name] ([id number]) -------------------------------------------------------------------------------- Nutrition Risk Assessment Details [place], Date of Service: 06/16/2016 8:00 AM Patient Name: [name] Patient Account Number: [id number] Medical Record Treating RN: Montey Hora [id number] Number: Other Clinician: Date of Birth/Sex: 1950-08-21 (66 y.o. Male) Treating [name], [place] Primary Care Doniel Maiello: Royetta Crochet Shenekia Riess/Extender: G Referring Daryl Beehler: Royetta Crochet Weeks in Treatment: 0 Height (in): 74 Weight (lbs): 252 Body Mass Index (BMI): 32.4 Nutrition Risk Assessment Items NUTRITION RISK SCREEN: I have an illness or condition that made me change the kind and/or 0 No amount of food I eat I eat fewer than two meals per day 0 No I eat few fruits and vegetables, or milk products 0 No I have three or more drinks of beer, liquor or wine almost every day 0 No I have tooth or mouth problems that make it hard for me to eat 0 No I don't always have enough money to buy the food I need 0 No I eat alone most of the time 0 No I take three or more different prescribed or over-the-counter drugs a 1 Yes day Without wanting to, I have lost or gained 10 pounds in the last six 0 No months I am not always physically able to shop, cook and/or feed myself 0 No Nutrition Protocols Good Risk Protocol 0 No interventions needed Moderate Risk Protocol Electronic Signature(s) Signed: 06/16/2016 4:03:51 PM By: Montey Hora Entered By: Montey Hora on 06/16/2016 08:19:42

## 2016-06-23 ENCOUNTER — Encounter: Payer: Medicare Other | Attending: Internal Medicine | Admitting: Internal Medicine

## 2016-06-23 DIAGNOSIS — I4891 Unspecified atrial fibrillation: Secondary | ICD-10-CM | POA: Diagnosis not present

## 2016-06-23 DIAGNOSIS — M109 Gout, unspecified: Secondary | ICD-10-CM | POA: Diagnosis not present

## 2016-06-23 DIAGNOSIS — F17210 Nicotine dependence, cigarettes, uncomplicated: Secondary | ICD-10-CM | POA: Insufficient documentation

## 2016-06-23 DIAGNOSIS — L97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: Secondary | ICD-10-CM | POA: Insufficient documentation

## 2016-06-23 DIAGNOSIS — I890 Lymphedema, not elsewhere classified: Secondary | ICD-10-CM | POA: Diagnosis not present

## 2016-06-23 DIAGNOSIS — I10 Essential (primary) hypertension: Secondary | ICD-10-CM | POA: Diagnosis not present

## 2016-06-23 DIAGNOSIS — Z8673 Personal history of transient ischemic attack (TIA), and cerebral infarction without residual deficits: Secondary | ICD-10-CM | POA: Diagnosis not present

## 2016-06-23 DIAGNOSIS — I87331 Chronic venous hypertension (idiopathic) with ulcer and inflammation of right lower extremity: Secondary | ICD-10-CM | POA: Insufficient documentation

## 2016-06-23 DIAGNOSIS — Z7901 Long term (current) use of anticoagulants: Secondary | ICD-10-CM | POA: Diagnosis not present

## 2016-06-23 DIAGNOSIS — Z86718 Personal history of other venous thrombosis and embolism: Secondary | ICD-10-CM | POA: Insufficient documentation

## 2016-06-24 NOTE — Progress Notes (Signed)
JOIE, REAMER ([id number]) Visit Report for 06/23/2016 Arrival Information Details [place], Date of Service: 06/23/2016 10:15 AM Patient Name: [facility] Patient Account Number: [id number] Medical Record Treating RN: Baruch Gouty, RN, [name], Velva Harman [id number] Number: Other Clinician: Date of Birth/Sex: [birth year] (66 y.o. Male) Treating [name] Primary Care Nyashia Raney: Royetta Crochet Terriana Barreras/Extender: G Referring Hayward Rylander: Doristine Locks in Treatment: 1 Visit Information History Since Last Visit All ordered tests and consults were completed: No Patient Arrived: Ambulatory Added or deleted any medications: No Arrival Time: 10:06 Any new allergies or adverse reactions: No Accompanied By: self Had a fall or experienced change in No Transfer Assistance: None activities of daily living that may affect Patient Identification Verified: Yes risk of falls: Secondary Verification Process Yes Signs or symptoms of abuse/neglect since last No Completed: visito Patient Has Alerts: Yes Hospitalized since last visit: No Patient Alerts: Patient on Blood Has Dressing in Place as Prescribed: Yes Thinner Has Compression in Place as Prescribed: Yes warfarin Pain Present Now: Yes Electronic Signature(s) Signed: 06/23/2016 1:13:55 PM By: [name] [name], RN Entered By: [name] on 06/23/2016 10:08:12 [place]field, [name] ([id number]) -------------------------------------------------------------------------------- Clinic Level of Care Assessment Details [place], Date of Service: 06/23/2016 10:15 AM Patient Name: [employer] Patient Account Number: [id number] Medical Record Treating RN: Baruch Gouty, RN, [name], Velva Harman [id number] Number: Other Clinician: Date of Birth/Sex: Dec 11, 1950 (66 y.o. Male) Treating [name] Primary Care Cassady Stanczak: Royetta Crochet Jani Moronta/Extender: G Referring Tamotsu Wiederholt: Doristine Locks in Treatment: 1 Clinic Level of Care Assessment Items TOOL 1 Quantity Score '[]'$  -  Use when EandM and Procedure is performed on INITIAL visit 0 ASSESSMENTS - Nursing Assessment / Reassessment '[]'$  - General Physical Exam (combine w/ comprehensive assessment (listed just 0 below) when performed on new pt. evals) '[]'$  - Comprehensive Assessment (HX, ROS, Risk Assessments, Wounds Hx, etc.) 0 ASSESSMENTS - Wound and Skin Assessment / Reassessment '[]'$  - Dermatologic / Skin Assessment (not related to wound area) 0 ASSESSMENTS - Ostomy and/or Continence Assessment and Care '[]'$  - Incontinence Assessment and Management 0 '[]'$  - Ostomy Care Assessment and Management (repouching, etc.) 0 PROCESS - Coordination of Care '[]'$  - Simple Patient / Family Education for ongoing care 0 '[]'$  - Complex (extensive) Patient / Family Education for ongoing care 0 '[]'$  - Staff obtains Programmer, systems, Records, Test Results / Process Orders 0 '[]'$  - Staff telephones HHA, Nursing Homes / Clarify orders / etc 0 '[]'$  - Routine Transfer to another Facility (non-emergent condition) 0 '[]'$  - Routine Hospital Admission (non-emergent condition) 0 '[]'$  - New Admissions / Biomedical engineer / Ordering NPWT, Apligraf, etc. 0 '[]'$  - Emergency Hospital Admission (emergent condition) 0 PROCESS - Special Needs '[]'$  - Pediatric / Minor Patient Management 0 Bergh, Natan ([id number]) '[]'$  - Isolation Patient Management 0 '[]'$  - Hearing / Language / Visual special needs 0 '[]'$  - Assessment of Community assistance (transportation, D/C planning, etc.) 0 '[]'$  - Additional assistance / Altered mentation 0 '[]'$  - Support Surface(s) Assessment (bed, cushion, seat, etc.) 0 INTERVENTIONS - Miscellaneous '[]'$  - External ear exam 0 '[]'$  - Patient Transfer (multiple staff / Civil Service fast streamer / Similar devices) 0 '[]'$  - Simple Staple / Suture removal (25 or less) 0 '[]'$  - Complex Staple / Suture removal (26 or more) 0 '[]'$  - Hypo/Hyperglycemic Management (do not check if billed separately) 0 '[]'$  - Ankle / Brachial Index (ABI) - do not check if billed separately 0 Has  the patient been seen at the hospital within the last three years: Yes Total Score: 0 Level Of Care: ____ Electronic  Signature(s) Signed: 06/23/2016 1:13:55 PM By: [name] [name], RN Entered By: [name] on 06/23/2016 11:10:19 Vaeth, [name] ([id number]) -------------------------------------------------------------------------------- Encounter Discharge Information Details [place], Date of Service: 06/23/2016 10:15 AM Patient Name: [name] Patient Account Number: [id number] Medical Record Treating RN: Baruch Gouty RN, [name], Velva Harman [id number] Number: Other Clinician: Date of Birth/Sex: 07-19-1950 (65 y.o. Male) Treating [name] Primary Care Maia Handa: Royetta Crochet Tank Difiore/Extender: G Referring Adelyne Marchese: Doristine Locks in Treatment: 1 Encounter Discharge Information Items Schedule Follow-up Appointment: No Medication Reconciliation completed and No provided to Patient/Care Raj Landress: Patient Clinical Summary of Care: Declined Electronic Signature(s) Signed: 06/23/2016 11:12:27 AM By: Ruthine Dose Entered By: Ruthine Dose on 06/23/2016 11:12:27 Burggraf, [name] ([id number]) -------------------------------------------------------------------------------- Lower Extremity Assessment Details [place], Date of Service: 06/23/2016 10:15 AM Patient Name: [name] Patient Account Number: [id number] Medical Record Treating RN: Baruch Gouty RN, [name], Velva Harman [id number] Number: Other Clinician: Date of Birth/Sex: 1950-08-03 (66 y.o. Male) Treating [name], [place] Primary Care Jaydin Jalomo: Royetta Crochet Akyra Bouchie/Extender: G Referring Marsel Gail: Doristine Locks in Treatment: 1 Edema Assessment Assessed: [Left: No] [Right: No] E[Left: dema] [Right: :] Calf Left: Right: Point of Measurement: 38 cm From Medial Instep 44.2 cm 44.8 cm Ankle Left: Right: Point of Measurement: 12 cm From Medial Instep 27.8 cm 29.7 cm Vascular Assessment Claudication: Claudication Assessment  [Right:None] Pulses: Dorsalis Pedis Palpable: [Right:Yes] Posterior Tibial Extremity colors, hair growth, and conditions: Extremity Color: [Right:Dusky] Hair Growth on Extremity: [Right:Yes] Temperature of Extremity: [Right:Warm] Capillary Refill: [Right:< 3 seconds] Toe Nail Assessment Left: Right: Thick: Yes Discolored: Yes Deformed: No Improper Length and Hygiene: Yes Electronic Signature(s) Signed: 06/23/2016 1:13:55 PM By: [name] [name], RN Kaffenberger, [place] ([id number]) Entered By: [name] on 06/23/2016 10:30:18 Shartzer, [name] ([id number]) -------------------------------------------------------------------------------- Multi Wound Chart Details [place], Date of Service: 06/23/2016 10:15 AM Patient Name: [name] Patient Account Number: [id number] Medical Record Treating RN: Baruch Gouty RN, [name], Velva Harman [id number] Number: Other Clinician: Date of Birth/Sex: [birth year] (65 y.o. 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] [N/A:N/A] Comorbid History: [4:Arrhythmia, Hypertension, N/A Gout, Neuropathy] Date Acquired: [4:06/02/2016] [N/A:N/A] Weeks of Treatment: [4:1] [N/A:N/A] Wound Status: [4:Open] [N/A:N/A] Measurements L x W x D 2.3x1.2x0.2 [N/A:N/A] (cm) Area (cm) : [4:2.168] [N/A:N/A] Volume (cm) : [4:0.434] [N/A:N/A] % Reduction in Area: [4:16.40%] [N/A:N/A] % Reduction in Volume: -67.60% [N/A:N/A] Classification: [4:Partial Thickness] [N/A:N/A] Exudate Amount: [4:Large] [N/A:N/A] Exudate Type: [4:Serous]  [N/A:N/A] Exudate Color: [4:amber] [N/A:N/A] Wound Margin: [4:Flat and Intact] [N/A:N/A] Granulation Amount: [4:Large (67-100%)] [N/A:N/A] Granulation Quality: [4:Red] [N/A:N/A] Necrotic Amount: [4:Small (1-33%)] [N/A:N/A] Exposed Structures: [4:Fascia: No Fat Layer (Subcutaneous Tissue) Exposed: No Tendon: No Muscle: No] [N/A:N/A] Joint: No Bone: No Limited to Skin Breakdown Epithelialization: None N/A N/A Debridement: Debridement ([id number]- N/A N/A 11047) Pre-procedure 10:57 N/A N/A Verification/Time Out Taken: Pain Control: Lidocaine 4% Topical N/A N/A Solution Tissue Debrided: Fibrin/Slough, N/A N/A Subcutaneous Level: Skin/Subcutaneous N/A N/A Tissue Debridement Area (sq 2.76 N/A N/A cm): Instrument: Curette N/A N/A Bleeding: Minimum N/A N/A Hemostasis Achieved: Pressure N/A N/A Procedural Pain: 0 N/A N/A Post Procedural Pain: 0 N/A N/A Debridement Treatment Procedure was tolerated N/A N/A Response: well Post Debridement 2.3x1.2x0.2 N/A N/A Measurements L x W x D (cm) Post Debridement 0.434 N/A N/A Volume: (cm) Periwound  Skin Texture: Excoriation: No N/A N/A Induration: No Callus: No Crepitus: No Rash: No Scarring: No Periwound Skin Maceration: No N/A N/A Moisture: Dry/Scaly: No Periwound Skin Color: Atrophie Blanche: No N/A N/A Cyanosis: No Ecchymosis: No Erythema: No Hemosiderin Staining: No Mottled: No Pallor: No Rubor: No Temperature: No Abnormality N/A N/A Tenderness on Yes N/A N/A Palpation: Wound Preparation: N/A N/A Gebel, [name] ([id number]) Ulcer Cleansing: Rinsed/Irrigated with Saline, Other: soap and water Topical Anesthetic Applied: Other: lidocaine 4% Procedures Performed: Debridement N/A N/A Treatment Notes Electronic Signature(s) Signed: 06/23/2016 4:28:51 PM By: [name] MD Entered By: [name] on 06/23/2016 12:23:03 Egle, [name]  ([id number]) -------------------------------------------------------------------------------- Multi-Disciplinary Care Plan Details [place], Date of Service: 06/23/2016 10:15 AM Patient Name: [name] Patient Account Number: [id number] Medical Record Treating RN: Baruch Gouty RN, [name], Velva Harman [id number] Number: Other Clinician: Date of Birth/Sex: May 17, 1951 (66 y.o. Male) Treating [name], [place] Primary Care Nicholis Stepanek: Royetta Crochet Alayne Estrella/Extender: G Referring Refael Fulop: Doristine Locks in Treatment: 1 Active Inactive ` Orientation to the Wound Care Program Nursing Diagnoses: Knowledge deficit related to the wound healing center program Goals: Patient/caregiver will verbalize understanding of the [place] Program Date Initiated: 06/16/2016 Target Resolution Date: 08/15/2016 Goal Status: Active Interventions: Provide education on orientation to the wound center Notes: ` Venous Leg Ulcer Nursing Diagnoses: Potential for venous Insuffiency (use before diagnosis confirmed) Goals: Patient will maintain optimal edema control Date Initiated: 06/16/2016 Target Resolution Date: 08/19/2016 Goal Status: Active Interventions: Compression as ordered Notes: ` Wound/Skin Impairment Nursing Diagnoses: ELSTER, CORBELLO ([id number]) Impaired tissue integrity Goals: Patient/caregiver will verbalize understanding of skin care regimen Date Initiated: 06/16/2016 Target Resolution Date: 07/15/2016 Goal Status: Active Ulcer/skin breakdown will have a volume reduction of 30% by week 4 Date Initiated: 06/16/2016 Target Resolution Date: 07/15/2016 Goal Status: Active Ulcer/skin breakdown will have a volume reduction of 50% by week 8 Date Initiated: 06/16/2016 Target Resolution Date: 07/15/2016 Goal Status: Active Ulcer/skin breakdown will have a volume reduction of 80% by week 12 Date Initiated: 06/16/2016 Target Resolution Date: 07/15/2016 Goal Status: Active Ulcer/skin breakdown will heal  within 14 weeks Date Initiated: 06/16/2016 Target Resolution Date: 07/15/2016 Goal Status: Active Interventions: Assess patient/caregiver ability to obtain necessary supplies Assess patient/caregiver ability to perform ulcer/skin care regimen upon admission and as needed Assess ulceration(s) every visit Notes: Electronic Signature(s) Signed: 06/23/2016 1:13:55 PM By: [name] [name], RN Entered By: [name] on 06/23/2016 10:30:21 Robotham, [name] ([id number]) -------------------------------------------------------------------------------- Pain Assessment Details [place], Date of Service: 06/23/2016 10:15 AM Patient Name: [name] Patient Account Number: [id number] Medical Record Treating RN: Baruch Gouty RN, [name], Velva Harman [id number] Number: Other Clinician: Date of Birth/Sex: 12/17/1950 (66 y.o. Male) Treating [name] Primary Care Idris Edmundson: Royetta Crochet Gabrielly Mccrystal/Extender: G Referring Eddis Pingleton: Doristine Locks in Treatment: 1 Active Problems Location of Pain Severity and Description of Pain Patient Has Paino Yes Site Locations Pain Location: Pain in Ulcers Rate the pain. Current Pain Level: 4 Character of Pain Describe the Pain: Tender Pain Management and Medication Current Pain Management: Medication: Yes Rest: Yes How does your pain impact your activities of daily livingo Sleep: Yes Bathing: Yes Appetite: Yes Relationship With Others: Yes Bladder Continence: Yes Emotions: Yes Bowel Continence: Yes Work: Yes Toileting: Yes Drive: Yes Dressing: Yes Hobbies: Yes Electronic Signature(s) Signed: 06/23/2016 1:13:55 PM By: [name] [name], RN Entered By: [name] on 06/23/2016 10:08:04 Lauman, [name] ([id number]) Flater, [name] ([id number]) -------------------------------------------------------------------------------- Wound Assessment Details [place], Date of Service: 06/23/2016 10:15 AM Patient Name: [name] Patient Account Number:  [id number] Medical Record Treating RN: Baruch Gouty,  RN, [name], Velva Harman [id number] Number: Other Clinician: Date of Birth/Sex: 1951/03/27 (66 y.o. Male) Treating [name], MICHAEL Primary Care Agatha Duplechain: Royetta Crochet Louie Meaders/Extender: G Referring Kyndle Schlender: Doristine Locks in Treatment: 1 Wound Status Wound Number: 4 Primary Venous Leg Ulcer Etiology: Wound Location: Right Lower Leg - Medial Wound Status: Open Wounding Event: Gradually Appeared Comorbid Arrhythmia, Hypertension, Gout, Date Acquired: 06/02/2016 History: Neuropathy Weeks Of Treatment: 1 Clustered Wound: No Photos Photo Uploaded By: [name] on 06/23/2016 13:13:12 Wound Measurements Length: (cm) 2.3 Width: (cm) 1.2 Depth: (cm) 0.2 Area: (cm) 2.168 Volume: (cm) 0.434 % Reduction in Area: 16.4% % Reduction in Volume: -67.6% Epithelialization: None Tunneling: No Undermining: No Wound Description Classification: Partial Thickness Wound Margin: Flat and Intact Exudate Amount: Large Troung, Izick ([id number]) Foul Odor After Cleansing: No Slough/Fibrino Yes Exudate Type: Serous Exudate Color: amber Wound Bed Granulation Amount: Large (67-100%) Exposed Structure Granulation Quality: Red Fascia Exposed: No Necrotic Amount: Small (1-33%) Fat Layer (Subcutaneous Tissue) Exposed: No Necrotic Quality: Adherent Slough Tendon Exposed: No Muscle Exposed: No Joint Exposed: No Bone Exposed: No Limited to Skin Breakdown Periwound Skin Texture Texture Color No Abnormalities Noted: No No Abnormalities Noted: No Callus: No Atrophie Blanche: No Crepitus: No Cyanosis: No Excoriation: No Ecchymosis: No Induration: No Erythema: No Rash: No Hemosiderin Staining: No Scarring: No Mottled: No Pallor: No Moisture Rubor: No No Abnormalities Noted: No Dry / Scaly: No Temperature / Pain Maceration: No Temperature: No Abnormality Tenderness on Palpation: Yes Wound Preparation Ulcer  Cleansing: Rinsed/Irrigated with Saline, Other: soap and water, Topical Anesthetic Applied: Other: lidocaine 4%, Electronic Signature(s) Signed: 06/23/2016 1:13:55 PM By: [name] [name], RN Entered By: [name] on 06/23/2016 10:29:41 [place], [name] ([id number]) -------------------------------------------------------------------------------- Vitals Details [place], Date of Service: 06/23/2016 10:15 AM Patient Name: [name] Patient Account Number: [id number] Medical Record Treating RN: Baruch Gouty, RN, [name], Velva Harman [id number] Number: Other Clinician: Date of Birth/Sex: [birth year] (65 y.o. Male) Treating [name], [place] Primary Care Tennille Montelongo: Royetta Crochet Maryhelen Lindler/Extender: G Referring Nakhia Levitan: Doristine Locks in Treatment: 1 Vital Signs Time Taken: 10:08 Temperature (F): 97.6 Height (in): 74 Pulse (bpm): 75 Weight (lbs): 252 Respiratory Rate (breaths/min): 18 Body Mass Index (BMI): 32.4 Blood Pressure (mmHg): 149/85 Reference Range: 80 - 120 mg / dl Electronic Signature(s) Signed: 06/23/2016 1:13:55 PM By: [name] [name], RN Entered By: [name] on 06/23/2016 10:08:34

## 2016-06-24 NOTE — Progress Notes (Signed)
[name] ([id number]) Visit Report for 06/23/2016 Chief Complaint Document Details Laborde, [name]: 06/23/2016 10:15 AM Patient Name: [name] Patient Account Number: [id number] Medical Record Treating RN: Baruch Gouty, RN, [name], Velva Harman [id number] Number: Other Clinician: Date of Birth/Sex: 1951-04-30 (66 y.o. Male) Treating [name] Primary Care Provider: Royetta Crochet Provider/Extender: G Referring Provider: Doristine Locks in Treatment: 1 Information Obtained from: Patient Chief Complaint [name] presents today for routine management on his RLE venous ulcer 06/16/16; patient returns today for a recurrent ulcer on the right medial leg exact same location as last time. Electronic Signature(s) Signed: 06/23/2016 4:28:51 PM By: [name] Entered By: [name] on 06/23/2016 12:25:17 [name], [name] ([id number]) -------------------------------------------------------------------------------- Debridement Details [name]: 06/23/2016 10:15 AM Patient Name: [name] Patient Account Number: [id number] Medical Record Treating RN: Baruch Gouty, RN, [name], Velva Harman [id number] Number: Other Clinician: Date of Birth/Sex: [birth year] (65 y.o. Male) Treating Mariselda Badalamenti, [name] Primary Care Provider: Royetta Crochet Provider/Extender: G Referring Provider: Doristine Locks in Treatment: 1 Debridement Performed for Wound #4 Right,Medial Lower Leg Assessment: Performed By: Physician Mark Dillon, [name] Debridement: Debridement Pre-procedure Yes - 10:57 Verification/Time Out Taken: Start Time: 10:57 Pain Control: Lidocaine 4% Topical Solution Level: Skin/Subcutaneous Tissue Total Area Debrided (L x 2.3 (cm) x 1.2 (cm) = 2.76 (cm) W): Tissue and other Non-Viable, Fibrin/Slough, Subcutaneous material debrided: Instrument: Curette Bleeding: Minimum Hemostasis Achieved: Pressure End Time: 11:01 Procedural Pain: 0 Post Procedural Pain:  0 Response to Treatment: Procedure was tolerated well Post Debridement Measurements of Total Wound Length: (cm) 2.3 Width: (cm) 1.2 Depth: (cm) 0.2 Volume: (cm) 0.434 Character of Wound/Ulcer Post Stable Debridement: Severity of Tissue Post Debridement: Fat layer exposed Post Procedure Diagnosis Same as Pre-procedure Electronic Signature(s) Signed: 06/23/2016 1:13:55 PM By: [name] [name], RN Signed: 06/23/2016 4:28:51 PM By: [name] [name] ([id number]) Entered By: [name] on 06/23/2016 12:25:07 [name], [name] ([id number]) -------------------------------------------------------------------------------- HPI Details [name]: 06/23/2016 10:15 AM Patient Name: [name] Patient Account Number: [id number] Medical Record Treating RN: Baruch Gouty, RN, [name], Velva Harman [id number] Number: Other Clinician: Date of Birth/Sex: [birth year] (66 y.o. Male) Treating [name] Primary Care Provider: Royetta Crochet Provider/Extender: G Referring Provider: Doristine Locks in Treatment: 1 History of Present Illness Location: open wound just above his right ankle medially Quality: Patient tells me he is not having a significant amount of pain at this point in time. Severity: 1 out of 10 Duration: This just reopened in the past week. Timing: Pain in wound is Intermittent Context: The wound appeared gradually over time Modifying Factors: Consults to this Mark include: he was seen in the ER and was referred to a vascular surgeon but the patient has not done that. He may have been treated with clindamycin in the ER. Associated Signs and Symptoms: Patient reports having difficulty standing for long periods. HPI Description: 66 year old gentleman who was seen in the emergency department recently on 01/06/2015 for a wound of his right lower extremity which he says was not involving any injury and he did not know how he sustained it. He had draining  foul-smelling liquid from the area and had gone for care there. his past medical history is significant for DVT, hypertension, gout, tobacco abuse, cocaine abuse, stroke, atrial fibrillation, pulmonary embolism. he has also had some vascular surgery with a stent placed in his leg. He has been a smoker for many years and has given up straight drugs several years ago. He continues to smoke about 4-5 cigarettes a day.  02/03/2015 -- received a note from 05/14/2013 where Dr. Leotis Pain placed an inferior vena cava filter. The patient had a deep vein thrombosis while therapeutic on anticoagulation for previous DVT and a IVC filter was placed for this. 02/10/2015 -- he did have his vascular test done on Friday but we have no reports yet. 02/17/2015 -- notes were reviewed from the vascular office and the patient had a venous ultrasound done which revealed that he had no reflux in the greater saphenous vein or the short saphenous vein bilaterally. He did have subacute DVT in the common femoral vein and popliteal veins on the right and left side. The recommendation was to continue with Unna's boot therapy at the wound clinic and then to wear graduated compression stockings once the ulcers healed and later if he had continuous problems lymphedema pump would benefit him. 03/17/2015 -- we have applied for his insurance and aide regarding cellular tissue-based products and are still awaiting the final clearance. 03/24/2015 -- he has had Apligraf authorized for him but his wound is looking so good today that we may not use it. 03/31/2015 -- he has not yet received his compression stockings though we have called a couple of times and hopefully they should arrive this week. [name] ([id number]) 01/06/16; this is a patient we have previously cared for in this clinic with wounds on his right medial ankle. I was not previously involved with his care. He has a history of DVT and is on  chronic Coumadin and one point had an inferior vena cava filter I'm not sure if that is still in place. He wears compression stockings. He had reflux studies done during his last stay in this clinic which did not show significant reflux in the greater or lesser saphenous veins bilaterally. His history is that he developed a open sore on the left medial malleolus one week ago. He was seen in his primary physician office and given a course of doxycycline which he still should be on. Previously seen vascular surgery who felt that he had some degree of lymphedema as well. He is not a diabetic 01/13/16 no major change 01/20/16; very small wound on the medial right ankle again covered with surface slough that doesn't seem to be spotting the Prisma 01/27/16; patient comes in today complaining of a lot of pain around the wound site. He has not been systemically unwell. 02/03/16; the patient's wound culture last week grew Proteus, I had empirically given doxycycline. The Proteus was not specifically plated against doxycycline however Proteus itself was fairly pansensitive and the patient comes back feeling a lot better today. I think the doxycycline was likely to be successful in sufficient 02/10/16; as predicted last week the area has closed over. These are probably venous insufficiency wounds although his previous reflux studies did not show superficial reflux. He also has a history of DVT and at one time had a Greenfield filter in place. The area in question on his left medial ankle region. It became secondarily infected but responded nicely to antibiotics. He is closed today 02/17/16 unfortunately patient's venous wound on the medial aspect of his right ankle at this point in time has reopened. He has been using some compression hose which appear to be very light that he purchased he tells me out of a magazine. He seems a little frustrated with the fact that this has reopened and is concerned about his  left lower extremity possibly reopening as well. 02/25/16 patient presents today for follow-up  evaluation regarding his right ankle wound. Currently he shows no interval signs or symptoms of infection. We have been compression wrapping him unfortunately the wraps that we had on him last week and he has a significant amount of swelling above whether this had slipped down to. He also notes that he's been having some burning as well at the wound site. He rates his discomfort at this point in time to be a 2-3 out of 10. Otherwise he has no other worsening symptoms. 03/03/16; this is a patient that had a wound on his left medial ankle that I discharged on 02/10/16. He apparently reappeared the next week with open areas on his right medial ankle. Her intake nurse reports today that he has a lot of drainage and odor at intake even after the wound was cleaned. Also of note the patient complains of edema in the left leg and showed up with only one of the 2 layer compression system. 03/05/2016 -- since his visit 2 days ago to see Dr. Dellia Nims he complained of significant pain in his right lower extremity which was much more than he's ever had before. He came in for an urgent visit to review his condition. He has been placed on doxycycline empirically and his culture reports were reviewed but the final result is not back. 03/10/16; patient was in last week to see Dr. Con Memos with increasing pain in his leg. He was reduced to a 3 layer compression from 4 which seems to have helped overall. Culture from last week grew again pansensitive Proteus, this should've been sensitive to the doxycycline I gave him and he is finishing that today. The patient is had previous arterial and venous review by vascular surgery. Patient is currently using Aquacel Ag under a 3 layer compression. 03/17/16; patient's wound dimensions are down this week. He has been using silver alginate 03/24/2016 - Mark. Saltos arrives today for  management of RLE venous ulcer. The alginate dressing is densly adhered to the ulcer. He offers no complaints, concerns, or needs. EUSTACE, HUR ([id number]) 03/31/16; no real change in the wound measurements post debridement. Using Prisma. If anything the measurements are larger today at 2 x 1 cm post debridement 04-07-16 Mark. Tomei arrives today for management of his right lower extremity venous ulcer. He is voicing no complaints associated with his wound over the last week. He does inquire about need for compression therapy, this appears to be a weekly inquiry. He was advised that compression therapy is indicated throughout the treatment of the wound and he will then transition to compression stockings. He is compliant with compression stockings to the left lower extremity. 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 The base of the wound is healthy we're using Hydrofera Blue. Measurements are smaller 04/21/16; patient has severe chronic venous insufficiency on the right medial lower leg. He is here with a venous insufficiency ulcer in that location. He continues to make progress in terms of wound area. Surface of the wound also appears to have very healthy granulation we have been using Hydrofera Blue and there seems to be very little reason to change. 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 He has an ulcer in his right medial lower leg. We have been making very gradual progress here using Hydrofera Blue for the last several weeks 05/05/16; this patient has severe chronic venous insufficiency. 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 He has a right lower extremity wound. The area is mostly  fully epithelialized however there is small area of tightly adherent eschar. I did not remove this today. It is likely to be healed underneath although I did not prove this today. discharging him to [place] on 20-30 mm  below-knee stockings READMISSION 06/16/16; this is a patient who is well known to this clinic. He has severe chronic venous insufficiency with venous inflammation and recurrent wounds predominantly on the right medial leg. He had venous reflux studies in 2016 that did not show significant superficial vein reflux in the greater or lesser saphenous veins bilaterally. He is compliant as far as I know with his compression stockings and BMI notes on 05/05/16 we discharged him on 20-30 mm below-knee stockings. I had also previously discharged him in September 2017 only to have recurrence in the same area. He does not have significant arterial insufficiency with a normal ABI on the right at 1.01. Nevertheless when we used 4 layer compression during his stay here in November 17 he complained of pain which seemed to have abated with reduction to 3 lower compression therefore that's what we are using. I think it is going to be reasonable to repeat the reflux studies at this point. 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 At one point he has an IVC filter. I believe this is still in place. His last pain studies were in 2016. At that point vascular surgery recommended compression. He is felt to have some degree of lymphedema. I believe the patient is compliant with his stockings. He does not give an obvious source to the opening of this wound he simply states he discovered it while removing his stockings. No trauma. Patient still smokes 4-5 cigarettes a day before he left the clinic he complained of shortness of breath, he is not complaining of chest pain or pleuritic chest pain no cough 07/03/16 complaining of pain over the wound area. He has severe chronic venous insufficiency in this leg. Significant chronic hemosiderin deposition. Electronic Signature(s) Signed: 06/23/2016 4:28:51 PM By: [name] Entered By: [name] on 06/23/2016  12:26:20 [name], [name] ([id number]) [name], [name] ([id number]) -------------------------------------------------------------------------------- Physical Exam Details [name], [name]: 06/23/2016 10:15 AM Patient Name: [name] Patient Account Number: [id number] Medical Record Treating RN: Baruch Gouty RN, [name], Velva Harman [id number] Number: Other Clinician: Date of Birth/Sex: 1950-10-02 (65 y.o. Male) Treating [name] Primary Care Provider: Royetta Crochet Provider/Extender: G Referring Provider: Doristine Locks in Treatment: 1 Constitutional Patient is hypertensive.. Pulse regular and within target range for patient.[name] Respirations regular, non-labored and within target range.. Temperature is normal and within the target range for the patient.. Patient's appearance is neat and clean. Appears in no acute distress. Well nourished and well developed.. Eyes Conjunctivae clear. No discharge.. Notes Wound exam; the area again is in the right medial lower leg. Necrotic surface again debrided with a #3 curet also eschar skin and nonviable subcutaneous tissue from around the circumference of this oval shaped wound. He has edema in the right leg which I think is chronic venous insufficiency. I see no evidence of infection around this wound, all of this looks like chronic venous inflammation Electronic Signature(s) Signed: 06/23/2016 4:28:51 PM By: [name] Entered By: [name] on 06/23/2016 12:33:16 [name], [name] ([id number]) -------------------------------------------------------------------------------- Physician Orders Details [name], [name]: 06/23/2016 10:15 AM Patient Name: [name] Patient Account Number: [id number] Medical Record Treating RN: Baruch Gouty RN, [name], Velva Harman [id number] Number: Other Clinician: Date of Birth/Sex: 1951-05-03 (65 y.o. Male) Treating Yony Roulston Primary  Care Provider: Royetta Crochet Provider/Extender: G Referring  Provider: Doristine Locks in Treatment: 1 Verbal / Phone Orders: No Diagnosis Coding Wound Cleansing Wound #4 Right,Medial Lower Leg o Clean wound with Normal Saline. o May shower with protection. Anesthetic Wound #4 Right,Medial Lower Leg o Topical Lidocaine 4% cream applied to wound bed prior to debridement Skin Barriers/Peri-Wound Care Wound #4 Right,Medial Lower Leg o Triamcinolone Acetonide Ointment Primary Wound Dressing Wound #4 Right,Medial Lower Leg o Hydrafera Blue Secondary Dressing Wound #4 Right,Medial Lower Leg o ABD pad Dressing Change Frequency Wound #4 Right,Medial Lower Leg o Change dressing every week Follow-up Appointments Wound #4 Right,Medial Lower Leg o Return Appointment in 1 week. o Nurse Visit as needed Edema Control Wound #4 Right,Medial Lower Leg o 3 Layer Compression System - Right Lower Extremity Kempker, Thaxton ([id number]) Additional Orders / Instructions Wound #4 Right,Medial Lower Leg o Increase protein intake. o Other: - please add vitamin c, vitamin a and zinc supplements to your diet Electronic Signature(s) Signed: 06/23/2016 1:13:55 PM By: [name] [name], RN Signed: 06/23/2016 4:28:51 PM By: [name] Entered By: [name] on 06/23/2016 11:10:11 [name], [name] ([id number]) -------------------------------------------------------------------------------- Problem List Details Fong, [name]: 06/23/2016 10:15 AM Patient Name: [name] Patient Account Number: [id number] Medical Record Treating RN: Baruch Gouty RN, [name], Velva Harman [id number] Number: Other Clinician: Date of Birth/Sex: Jan 08, 1951 (65 y.o. 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 I87.331 Chronic venous hypertension (idiopathic) with ulcer and 06/16/2016 Yes inflammation of right lower extremity I89.0 Lymphedema, not elsewhere classified 06/16/2016 Yes Inactive Problems Resolved Problems Electronic Signature(s) Signed: 06/23/2016 4:28:51 PM By: [name] Entered By: [name] on 06/23/2016 12:22:54 [name], [name] ([id number]) -------------------------------------------------------------------------------- Progress Note Details [name], [name]: 06/23/2016 10:15 AM Patient Name: [name] Patient Account Number: [id number] Medical Record Treating RN: Baruch Gouty RN, [name], Velva Harman [id number] Number: Other Clinician: Date of Birth/Sex: 09-30-1950 (65 y.o. Male) Treating [name] Primary Care Provider: Royetta Crochet Provider/Extender: G Referring Provider: Doristine Locks in Treatment: 1 Subjective Chief Complaint Information obtained from Patient Mark Boulanger presents today for routine management on his RLE venous ulcer 06/16/16; patient returns today for a recurrent ulcer on the right medial leg exact same location as last time. History of Present Illness (HPI) The following HPI elements were documented for the patient's wound: Location: open wound just above his right ankle medially Quality: Patient tells me he is not having a significant amount of pain at this point in time. Severity: 1 out of 10 Duration: This just reopened in the past week. Timing: Pain in wound is Intermittent Context: The wound appeared gradually over time Modifying Factors: Consults to this Mark include: he was seen in the ER and was referred to a vascular surgeon but the patient has not done that. He may have been treated with clindamycin in the ER. Associated Signs and Symptoms: Patient reports having difficulty standing for long periods. 66 year old gentleman who was seen in the emergency department recently on 01/06/2015 for a wound  of his right lower extremity which he says was not involving any injury and he did not know how he sustained it. He had draining foul-smelling liquid from the area and had gone for care there. his past medical history is significant for DVT, hypertension, gout, tobacco abuse, cocaine abuse, stroke, atrial  fibrillation, pulmonary embolism. he has also had some vascular surgery with a stent placed in his leg. He has been a smoker for many years and has given up straight drugs several years ago. He continues to smoke about 4-5 cigarettes a day. 02/03/2015 -- received a note from 05/14/2013 where Dr. Leotis Pain placed an inferior vena cava filter. The patient had a deep vein thrombosis while therapeutic on anticoagulation for previous DVT and a IVC filter was placed for this. 02/10/2015 -- he did have his vascular test done on Friday but we have no reports yet. 02/17/2015 -- notes were reviewed from the vascular office and the patient had a venous ultrasound done which revealed that he had no reflux in the greater saphenous vein or the short saphenous vein bilaterally. He did have subacute DVT in the common femoral vein and popliteal veins on the right and left side. The recommendation was to continue with Unna's boot therapy at the wound clinic and then to wear graduated compression stockings once the ulcers healed and later if he had continuous problems lymphedema pump would benefit him. Mark, [name] ([id number]) 03/17/2015 -- we have applied for his insurance and aide regarding cellular tissue-based products and are still awaiting the final clearance. 03/24/2015 -- he has had Apligraf authorized for him but his wound is looking so good today that we may not use it. 03/31/2015 -- he has not yet received his compression stockings though we have called a couple of times and hopefully they should arrive this week. READMISSION 01/06/16; this is a patient we have previously cared for in this  clinic with wounds on his right medial ankle. I was not previously involved with his care. 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 He wears compression stockings. He had reflux studies done during his last stay in this clinic which did not show significant reflux in the greater or lesser saphenous veins bilaterally. His history is that he developed a open sore on the left medial malleolus one week ago. He was seen in his primary physician office and given a course of doxycycline which he still should be on. Previously seen vascular surgery who felt that he had some degree of lymphedema as well. He is not a diabetic 01/13/16 no major change 01/20/16; very small wound on the medial right ankle again covered with surface slough that doesn't seem to be spotting the Prisma 01/27/16; patient comes in today complaining of a lot of pain around the wound site. He has not been systemically unwell. 02/03/16; the patient's wound culture last week grew Proteus, I had empirically given doxycycline. The Proteus was not specifically plated against doxycycline however Proteus itself was fairly pansensitive and the patient comes back feeling a lot better today. I think the doxycycline was likely to be successful in sufficient 02/10/16; as predicted last week the area has closed over. These are probably venous insufficiency wounds although his previous reflux studies did not show superficial reflux. He also has a history of DVT and at one time had a Greenfield filter in place. The area in question on his left medial ankle region. It became secondarily infected but responded nicely to antibiotics. He is closed today 02/17/16 unfortunately patient's venous wound on the medial aspect of his right ankle at this point in time has reopened. He has been using some compression hose which appear to be  very light that he purchased  he tells me out of a magazine. He seems a little frustrated with the fact that this has reopened and is concerned about his left lower extremity possibly reopening as well. 02/25/16 patient presents today for follow-up evaluation regarding his right ankle wound. Currently he shows no interval signs or symptoms of infection. We have been compression wrapping him unfortunately the wraps that we had on him last week and he has a significant amount of swelling above whether this had slipped down to. He also notes that he's been having some burning as well at the wound site. He rates his discomfort at this point in time to be a 2-3 out of 10. Otherwise he has no other worsening symptoms. 03/03/16; this is a patient that had a wound on his left medial ankle that I discharged on 02/10/16. He apparently reappeared the next week with open areas on his right medial ankle. Her intake nurse reports today that he has a lot of drainage and odor at intake even after the wound was cleaned. Also of note the patient complains of edema in the left leg and showed up with only one of the 2 layer compression system. 03/05/2016 -- since his visit 2 days ago to see Dr. Dellia Nims he complained of significant pain in his right lower extremity which was much more than he's ever had before. He came in for an urgent visit to review his condition. He has been placed on doxycycline empirically and his culture reports were reviewed but the final result is not back. 03/10/16; patient was in last week to see Dr. Con Memos with increasing pain in his leg. He was reduced to a 3 Gloor, Shant ([id number]) layer compression from 4 which seems to have helped overall. Culture from last week grew again pansensitive Proteus, this should've been sensitive to the doxycycline I gave him and he is finishing that today. The patient is had previous arterial and venous review by vascular surgery. Patient is currently using Aquacel Ag under a 3  layer compression. 03/17/16; patient's wound dimensions are down this week. He has been using silver alginate 03/24/2016 - Mark. Dunshee arrives today for management of RLE venous ulcer. The alginate dressing is densly adhered to the ulcer. He offers no complaints, concerns, or needs. 03/31/16; no real change in the wound measurements post debridement. Using Prisma. If anything the measurements are larger today at 2 x 1 cm post debridement 04-07-16 Mark. Tomei arrives today for management of his right lower extremity venous ulcer. He is voicing no complaints associated with his wound over the last week. He does inquire about need for compression therapy, this appears to be a weekly inquiry. He was advised that compression therapy is indicated throughout the treatment of the wound and he will then transition to compression stockings. He is compliant with compression stockings to the left lower extremity. 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 The base of the wound is healthy we're using Hydrofera Blue. Measurements are smaller 04/21/16; patient has severe chronic venous insufficiency on the right medial lower leg. He is here with a venous insufficiency ulcer in that location. He continues to make progress in terms of wound area. Surface of the wound also appears to have very healthy granulation we have been using Hydrofera Blue and there seems to be very little reason to change. 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 He has an ulcer in his  right medial lower leg. We have been making very gradual progress here using Hydrofera Blue for the last several weeks 05/05/16; this patient has severe chronic venous insufficiency. 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 He has a right lower extremity wound. The area is mostly fully epithelialized however there is small area of tightly adherent eschar. I did not  remove this today. It is likely to be healed underneath although I did not prove this today. discharging him to [place] on 20-30 mm below-knee stockings READMISSION 06/16/16; this is a patient who is well known to this clinic. He has severe chronic venous insufficiency with venous inflammation and recurrent wounds predominantly on the right medial leg. He had venous reflux studies in 2016 that did not show significant superficial vein reflux in the greater or lesser saphenous veins bilaterally. He is compliant as far as I know with his compression stockings and BMI notes on 05/05/16 we discharged him on 20-30 mm below-knee stockings. I had also previously discharged him in September 2017 only to have recurrence in the same area. He does not have significant arterial insufficiency with a normal ABI on the right at 1.01. Nevertheless when we used 4 layer compression during his stay here in November 17 he complained of pain which seemed to have abated with reduction to 3 lower compression therefore that's what we are using. I think it is going to be reasonable to repeat the reflux studies at this point. 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 At one point he has an IVC filter. I believe this is still in place. His last pain studies were in 2016. At that point vascular surgery recommended compression. He is felt to have some degree of lymphedema. I believe the patient is compliant with his stockings. He does not give an obvious source to the opening of this wound he simply states he discovered it while removing his stockings. No trauma. Patient still smokes 4-5 cigarettes a day before he left the clinic he complained of shortness of breath, he is not complaining of chest pain or pleuritic chest pain no cough [name], [name] ([id number]) 07/03/16 complaining of pain over the wound area. He has severe chronic venous insufficiency in this leg. Significant  chronic hemosiderin deposition. Objective Constitutional Patient is hypertensive.. Pulse regular and within target range for patient.[name] Respirations regular, non-labored and within target range.. Temperature is normal and within the target range for the patient.. Patient's appearance is neat and clean. Appears in no acute distress. Well nourished and well developed.. Vitals Time Taken: 10:08 AM, Height: 74 in, Weight: 252 lbs, BMI: 32.4, Temperature: 97.6 F, Pulse: 75 bpm, Respiratory Rate: 18 breaths/min, Blood Pressure: 149/85 mmHg. Eyes Conjunctivae clear. No discharge.. General Notes: Wound exam; the area again is in the right medial lower leg. Necrotic surface again debrided with a #3 curet also eschar skin and nonviable subcutaneous tissue from around the circumference of this oval shaped wound. He has edema in the right leg which I think is chronic venous insufficiency. I see no evidence of infection around this wound, all of this looks like chronic venous inflammation Integumentary (Hair, Skin) Wound #4 status is Open. Original cause of wound was Gradually Appeared. The wound is located on the Right,Medial Lower Leg. The wound measures 2.3cm length x 1.2cm width x 0.2cm depth; 2.168cm^2 area and 0.434cm^3 volume. The wound is limited to skin breakdown. There is no tunneling or undermining noted. There is a large amount of serous drainage noted. The  wound margin is flat and intact. There is large (67-100%) red granulation within the wound bed. There is a small (1-33%) amount of necrotic tissue within the wound bed including Adherent Slough. The periwound skin appearance did not exhibit: Callus, Crepitus, Excoriation, Induration, Rash, Scarring, Dry/Scaly, Maceration, Atrophie Blanche, Cyanosis, Ecchymosis, Hemosiderin Staining, Mottled, Pallor, Rubor, Erythema. Periwound temperature was noted as No Abnormality. The periwound has tenderness on palpation. 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 There was a Skin/Subcutaneous Tissue Debridement ([id number]) debridement with total area of 2.76 sq cm performed by Mark Dillon, [name]. with the following instrument(s): Curette to remove Non-Viable tissue/material including Fibrin/Slough and Subcutaneous after achieving pain control using Lidocaine 4% Topical Solution. A time out was conducted at 10:57, prior to the start of the procedure. A Minimum amount of bleeding was controlled with Pressure. The procedure was tolerated well with a pain level of 0 throughout and a pain level of 0 following the procedure. Post Debridement Measurements: 2.3cm length x 1.2cm width x 0.2cm depth; 0.434cm^3 volume. Character of Wound/Ulcer Post Debridement is stable. Severity of Tissue Post Debridement is: Fat layer exposed. Post procedure Diagnosis Wound #4: Same as Pre-Procedure Plan Wound Cleansing: Wound #4 Right,Medial Lower Leg: Clean wound with Normal Saline. May shower with protection. Anesthetic: Wound #4 Right,Medial Lower Leg: Topical Lidocaine 4% cream applied to wound bed prior to debridement Skin Barriers/Peri-Wound Care: Wound #4 Right,Medial Lower Leg: Triamcinolone Acetonide Ointment Primary Wound Dressing: Wound #4 Right,Medial Lower Leg: Hydrafera Blue Secondary Dressing: Wound #4 Right,Medial Lower Leg: ABD pad Dressing Change Frequency: Wound #4 Right,Medial Lower Leg: Change dressing every week Follow-up Appointments: Wound #4 Right,Medial Lower Leg: Julson, Estus ([id number]) Return Appointment in 1 week. Nurse Visit as needed Edema Control: Wound  #4 Right,Medial Lower Leg: 3 Layer Compression System - Right Lower Extremity Additional Orders / Instructions: Wound #4 Right,Medial Lower Leg: Increase protein intake. Other: - please add vitamin c, vitamin a and zinc supplements to your diet no change to the hydrofera blue Electronic Signature(s) Signed: 06/23/2016 4:28:51 PM By: [name] Entered By: [name] on 06/23/2016 12:33:49 Antonini, [name] ([id number]) -------------------------------------------------------------------------------- SuperBill Details [name], [name]: 06/23/2016 Patient Name: [name] Patient Account Number: [id number] Medical Record Treating RN: Baruch Gouty RN, [name], Velva Harman [id number] Number: Other Clinician: Date of Birth/Sex: 09-Aug-1950 (66 y.o. Male) Treating [name] Primary Care Provider: Royetta Crochet Provider/Extender: G Referring Provider: Doristine Locks in Treatment: 1 Diagnosis Coding ICD-10 Codes Code Description [phone] Non-pressure chronic ulcer of right calf with necrosis of muscle Chronic venous hypertension (idiopathic) with ulcer and inflammation of right lower I87.331 extremity I89.0 Lymphedema, not elsewhere classified Facility Procedures CPT4 Code Description: 90300923 11042 - DEB SUBQ TISSUE 20 SQ CM/< ICD-10 Description Diagnosis L97.213 Non-pressure chronic ulcer of right calf with necro Modifier: sis of muscl Quantity: 1 e Physician Procedures CPT4 Code Description: 3007622 63335 - WC PHYS SUBQ TISS 20 SQ CM ICD-10 Description Diagnosis L97.213 Non-pressure chronic ulcer of right calf with necro Modifier: sis of muscle Quantity: 1 Electronic Signature(s) Signed: 06/23/2016 4:28:51 PM By: [name] Entered By: [name] on 06/23/2016 12:34:13

## 2016-06-27 ENCOUNTER — Emergency Department: Payer: Medicare Other

## 2016-06-27 ENCOUNTER — Emergency Department
Admission: EM | Admit: 2016-06-27 | Discharge: 2016-06-27 | Disposition: A | Discharge status: Home / Self Care | Payer: Medicare Other | Attending: Emergency Medicine | Admitting: Emergency Medicine

## 2016-06-27 DIAGNOSIS — Z7901 Long term (current) use of anticoagulants: Secondary | ICD-10-CM | POA: Insufficient documentation

## 2016-06-27 DIAGNOSIS — M7989 Other specified soft tissue disorders: Secondary | ICD-10-CM

## 2016-06-27 DIAGNOSIS — I10 Essential (primary) hypertension: Secondary | ICD-10-CM | POA: Diagnosis not present

## 2016-06-27 DIAGNOSIS — E119 Type 2 diabetes mellitus without complications: Secondary | ICD-10-CM | POA: Insufficient documentation

## 2016-06-27 DIAGNOSIS — Z7982 Long term (current) use of aspirin: Secondary | ICD-10-CM | POA: Diagnosis not present

## 2016-06-27 DIAGNOSIS — I825Y1 Chronic embolism and thrombosis of unspecified deep veins of right proximal lower extremity: Secondary | ICD-10-CM | POA: Insufficient documentation

## 2016-06-27 DIAGNOSIS — F17210 Nicotine dependence, cigarettes, uncomplicated: Secondary | ICD-10-CM | POA: Diagnosis not present

## 2016-06-27 DIAGNOSIS — M79604 Pain in right leg: Secondary | ICD-10-CM

## 2016-06-27 LAB — COMPREHENSIVE METABOLIC PANEL
ALBUMIN: 3.4 g/dL — AB (ref 3.5–5.0)
ALK PHOS: 84 U/L (ref 38–126)
ALT: 27 U/L (ref 17–63)
ANION GAP: 5 (ref 5–15)
AST: 37 U/L (ref 15–41)
BILIRUBIN TOTAL: 0.9 mg/dL (ref 0.3–1.2)
BUN: 18 mg/dL (ref 6–20)
CALCIUM: 8.7 mg/dL — AB (ref 8.9–10.3)
CO2: 29 mmol/L (ref 22–32)
Chloride: 107 mmol/L (ref 101–111)
Creatinine, Ser: 0.9 mg/dL (ref 0.61–1.24)
GFR calc non Af Amer: >60 mL/min (ref >60)
GLUCOSE: 110 mg/dL — AB (ref 65–99)
POTASSIUM: 3.8 mmol/L (ref 3.5–5.1)
SODIUM: 141 mmol/L (ref 135–145)
TOTAL PROTEIN: 6.9 g/dL (ref 6.5–8.1)

## 2016-06-27 LAB — CBC WITH DIFFERENTIAL/PLATELET
BASOS PCT: 0 %
Basophils Absolute: 0 10*3/uL (ref 0–0.1)
EOS ABS: 0.1 10*3/uL (ref 0–0.7)
Eosinophils Relative: 3 %
HCT: 36.6 % — ABNORMAL LOW (ref 40.0–52.0)
Hemoglobin: 11.9 g/dL — ABNORMAL LOW (ref 13.0–18.0)
LYMPHS ABS: 1.1 10*3/uL (ref 1.0–3.6)
Lymphocytes Relative: 31 %
MCH: 24.5 pg — AB (ref 26.0–34.0)
MCHC: 32.5 g/dL (ref 32.0–36.0)
MCV: 75.5 fL — ABNORMAL LOW (ref 80.0–100.0)
MONO ABS: 0.4 10*3/uL (ref 0.2–1.0)
MONOS PCT: 10 %
Neutro Abs: 2 10*3/uL (ref 1.4–6.5)
Neutrophils Relative %: 56 %
Platelets: 147 10*3/uL — ABNORMAL LOW (ref 150–440)
RBC: 4.85 MIL/uL (ref 4.40–5.90)
RDW: 18.4 % — AB (ref 11.5–14.5)
WBC: 3.6 10*3/uL — ABNORMAL LOW (ref 3.8–10.6)

## 2016-06-27 LAB — PROTIME-INR
INR: 2.56
Prothrombin Time: 28 seconds — ABNORMAL HIGH (ref 11.4–15.2)

## 2016-06-27 MED ORDER — OXYCODONE-ACETAMINOPHEN 5-325 MG PO TABS
1.0000 | ORAL_TABLET | Freq: Once | ORAL | Status: AC
  Administered 2016-06-27: 1 via ORAL
  Filled 2016-06-27: qty 1

## 2016-06-27 MED ORDER — CEPHALEXIN 500 MG PO CAPS: 500.0000 mg | ORAL_CAPSULE | Freq: Three times a day (TID) | ORAL | 0 refills | Status: DC

## 2016-06-27 NOTE — ED Provider Notes (Signed)
[facility] Emergency Department Provider Note  ____________________________________________  Time seen: Approximately 3:59 PM  I have reviewed the triage vital signs and the nursing notes.   HISTORY  Chief Complaint Wound Infection    HPI [name] is a 66 y.o. male who complains of worsening pain and swelling of the right leg since about 5 PM yesterday. Denies any falls or new injuries. No fever chills nausea vomiting. Eating and drinking normally. He's concerned that he could have a wound infection in that area. He said seeing the wound care center due to a chronic nonhealing ulcer on the leg. He requests pain medicine.     Past Medical History:  Diagnosis Date  . Atrial fibrillation ([place])   . Cocaine abuse    + UDS on admission  . Gout current and history of  . History of deep vein thrombosis 2006  . History of tobacco abuse    has smoked for 50 years  . Hypertension   . Pulmonary embolism ([place])   . Stroke [facility])      Patient Active Problem List   Diagnosis Date Noted  . 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 Pneumonia 10/12/2014  . CVA (cerebral infarction) 04/26/2011  . Diabetes mellitus 04/26/2011  . 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 Anemia 04/26/2011  . DVT (deep venous thrombosis) ([place]) 04/26/2011     Past Surgical History:  Procedure Laterality Date  . VASCULAR SURGERY     Stent in leg     Prior to Admission medications   Medication Sig Start Date End Date Taking? Authorizing Provider  aspirin 81 MG tablet Take 81 mg by mouth daily.    Historical Provider, MD  cephALEXin (KEFLEX) 500 MG capsule Take 1 capsule (500 mg total) by mouth 3 (three) times daily. 06/27/16   Carrie Mew, MD  colchicine 0.6 MG tablet Take 0.6 mg by mouth daily as needed (for gout).     Historical Provider, MD  dicyclomine (BENTYL) 10 MG capsule Take 1 capsule (10 mg total) by mouth 3  (three) times daily before meals. 05/14/15 05/28/15  Loney Hering, MD  furosemide (LASIX) 40 MG tablet  08/05/15   Historical Provider, MD  FUROSEMIDE PO Take 1 tablet by mouth daily.    Historical Provider, MD  lactulose (CHRONULAC) 10 GM/15ML solution Take 30 mLs (20 g total) by mouth daily as needed for mild constipation. 05/11/15   Paulette Blanch, MD  metoprolol tartrate (LOPRESSOR) 25 MG tablet Take 25 mg by mouth 2 (two) times daily.    Historical Provider, MD  oxyCODONE-acetaminophen (ROXICET) 5-325 MG tablet Take 1 tablet by mouth every 4 (four) hours as needed for severe pain. 09/14/15   Paulette Blanch, MD  polyethylene glycol [facility]) packet Take 17 g by mouth daily. 05/14/15   Loney Hering, MD  simvastatin (ZOCOR) 20 MG tablet Take 20 mg by mouth daily.    Historical Provider, MD  warfarin (COUMADIN) 2 MG tablet Take 2 mg by mouth daily. Take with the '6mg'$  tablet for a total of '8mg'$ .    Historical Provider, MD  warfarin (COUMADIN) 6 MG tablet Take 6 mg by mouth daily. Every evening at 5 pm. Take with '2mg'$  tablet for a total of '8mg'$ .    Historical Provider, MD     Allergies Patient has no known allergies.   Family History  Problem Relation Age of Onset  . Aneurysm Mother   .  Diabetes Father     Social History Social History  Substance Use Topics  . Smoking status: Current Every Day Smoker    Packs/day: 0.50    Years: 50.00    Types: Cigarettes  . Smokeless tobacco: Never Used  . Alcohol use No    Review of Systems  Constitutional:   No fever or chills.  ENT:   No sore throat. No rhinorrhea. Cardiovascular:   No chest pain. Respiratory:   No dyspnea or cough. Gastrointestinal:   Negative for abdominal pain, vomiting and diarrhea.  Genitourinary:   Negative for dysuria or difficulty urinating. Musculoskeletal:   Right leg pain and swelling as above. Neurological:   Negative for headaches 10-point ROS otherwise  negative.  ____________________________________________   PHYSICAL EXAM:  VITAL SIGNS: ED Triage Vitals  Enc Vitals Group     BP 06/27/16 1115 (!) 151/95     Pulse Rate 06/27/16 1115 77     Resp 06/27/16 1115 20     Temp 06/27/16 1115 97.7 F (36.5 C)     Temp Source 06/27/16 1115 Oral     SpO2 06/27/16 1115 99 %     Weight 06/27/16 1116 250 lb (113.4 kg)     Height 06/27/16 1116 '6\' 2"'$  (1.88 m)     Head Circumference --      Peak Flow --      Pain Score 06/27/16 1121 10     Pain Loc --      Pain Edu? --      Excl. in [place]? --     Vital signs reviewed, nursing assessments reviewed.   Constitutional:   Alert and oriented. Well appearing and in no distress. Eyes:   No scleral icterus. No conjunctival pallor. PERRL. EOMI.  No nystagmus. ENT   Head:   Normocephalic and atraumatic.   Nose:   No congestion/rhinnorhea. No septal hematoma   Mouth/Throat:   MMM, no pharyngeal erythema. No peritonsillar mass.    Neck:   No stridor. No SubQ emphysema. No meningismus. Hematological/Lymphatic/Immunilogical:   No cervical lymphadenopathy. Cardiovascular:   RRR. Symmetric bilateral radial and DP pulses.  No murmurs.  Respiratory:   Normal respiratory effort without tachypnea nor retractions. Breath sounds are clear and equal bilaterally. No wheezes/rales/rhonchi. Gastrointestinal:   Soft and nontender. Non distended. There is no CVA tenderness.  No rebound, rigidity, or guarding. Genitourinary:   deferred Musculoskeletal:   Right lower extremity with Coban wrap from the proximal tibia down to the forefoot. After removing this, can see that there is diffuse faint erythema of the right lower extremity. Calf circumference is increased compared to the left. There is a 2 cm skin ulceration to the subcutaneous soft tissue on the medial distal lower leg. This area is clean and granulating without purulent drainage. No crepitus blistering induration or warmth or lymphangitis. No  fluctuance.. Neurologic:   Normal speech and language.  CN 2-10 normal. Motor grossly intact. No gross focal neurologic deficits are appreciated.  Skin:    Skin is warm, dry with right leg findings as above.  No petechiae, purpura, or bullae.  ____________________________________________    LABS (pertinent positives/negatives) (all labs ordered are listed, but only abnormal results are displayed) Labs Reviewed  CBC WITH DIFFERENTIAL/PLATELET - Abnormal; Notable for the following:       Result Value   WBC 3.6 (*)    Hemoglobin 11.9 (*)    HCT 36.6 (*)    MCV 75.5 (*)    MCH 24.5 (*)  RDW 18.4 (*)    Platelets 147 (*)    All other components within normal limits  COMPREHENSIVE METABOLIC PANEL - Abnormal; Notable for the following:    Glucose, Bld 110 (*)    Calcium 8.7 (*)    Albumin 3.4 (*)    All other components within normal limits  PROTIME-INR - Abnormal; Notable for the following:    Prothrombin Time 28.0 (*)    All other components within normal limits   ____________________________________________   EKG    ____________________________________________    RADIOLOGY  Ultrasound right lower extremity reveals nonocclusive thrombus diffusely in the deep vein system of the right leg. This is chronic since at least 2015 according to electronic medical record.  ____________________________________________   PROCEDURES Procedures  ____________________________________________   INITIAL IMPRESSION / ASSESSMENT AND PLAN / ED COURSE  Pertinent labs & imaging results that were available during my care of the patient were reviewed by me and considered in my medical decision making (see chart for details).  Patient well appearing no acute distress, complains of right leg pain. Examination is not consistent with cellulitis abscess necrotizing fasciitis or osteomyelitis. Ultrasound is negative for DVT, and patient is on warfarin. We'll check his INR. Plan to review  that, follow up with wound care center, follow up with primary care.       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     Portions of this note were generated with dragon dictation software. Dictation errors may occur despite best attempts at proofreading.    Carrie Mew, MD 06/27/16 1726

## 2016-06-27 NOTE — ED Triage Notes (Signed)
Pt reports worsening wound to right leg. Increased drainage and odor. Has a wound care nurse that comes out to the home that changes bandage.

## 2016-06-27 NOTE — Discharge Instructions (Signed)
You INR today is 2.5.  Your ultrasound of the leg does not show any new issues.  Take keflex and follow up with the [facility] and your primary care doctor.

## 2016-06-27 NOTE — ED Notes (Signed)
Applied dressing over wound and re-wrapped pts foot.

## 2016-06-30 ENCOUNTER — Encounter: Payer: Medicare Other | Admitting: Internal Medicine

## 2016-06-30 DIAGNOSIS — I87331 Chronic venous hypertension (idiopathic) with ulcer and inflammation of right lower extremity: Secondary | ICD-10-CM | POA: Diagnosis not present

## 2016-07-01 NOTE — Progress Notes (Signed)
[name] ([id number]) Visit Report for 06/30/2016 Arrival Information Details [place], Date of Service: 06/30/2016 8:15 AM Patient Name: [name] Patient Account Number: [id number] Medical Record Treating RN: [name] [id number] Number: Other Clinician: Date of Birth/Sex: [birth year] (66 y.o. Male) Treating [name] Primary Care [name]: [name] [name]/Extender: G Referring [name]: [name] in Treatment: 2 Visit Information History Since Last Visit All ordered tests and consults were completed: No Patient Arrived: Ambulatory Added or deleted any medications: No Arrival Time: 08:14 Any new allergies or adverse reactions: No Accompanied By: self Had a fall or experienced change in No Transfer Assistance: None activities of daily living that may affect Patient Identification Verified: Yes risk of falls: Secondary Verification Process Yes Signs or symptoms of abuse/neglect since last No Completed: visito Patient Has Alerts: Yes Hospitalized since last visit: No Patient Alerts: Patient on Blood Has Dressing in Place as Prescribed: Yes Thinner Has Compression in Place as Prescribed: Yes warfarin Pain Present Now: Yes Electronic Signature(s) Signed: 06/30/2016 7:45:01 PM By: Gretta Cool, RN, [name], [name], [name] Entered By: Gretta Cool, RN, [name], [name] on 06/30/2016 08:15:04 Buchmann, [name] ([id number]) -------------------------------------------------------------------------------- Compression Therapy Details [place], Date of Service: 06/30/2016 8:15 AM Patient Name: [name] Patient Account Number: [id number] Medical Record Treating RN: [name] [id number] Number: Other Clinician: Date of Birth/Sex: [name] (66 y.o. Male) Treating [name] Primary Care [name]: [name] [name]/Extender: G Referring [name]: [name] [name] in Treatment: 2 Compression Therapy Performed for Wound Wound #4 Right,Medial Lower  Leg Assessment: Performed By: Clinician Mark Barman, RN Compression Type: Three Layer Pre Treatment ABI: 1 Post Procedure Diagnosis Same as Pre-procedure Electronic Signature(s) Signed: 06/30/2016 7:45:01 PM By: Gretta Cool, RN, [name], [name], [name] Entered By: Gretta Cool, RN, [name], [name] on 06/30/2016 10:01:03 Hallgren, [name] ([id number]) -------------------------------------------------------------------------------- Encounter Discharge Information Details [place], Date of Service: 06/30/2016 8:15 AM Patient Name: [name] Patient Account Number: [id number] Medical Record Treating RN: [name] [id number] Number: Other Clinician: Date of Birth/Sex: [name] (66 y.o. Male) Treating [name], [name] Primary Care Lakitha Gordy: [name] Mckay Brandt/Extender: G Referring Amaal Dimartino: [name] in Treatment: 2 Encounter Discharge Information Items Discharge Pain Level: 0 Discharge Condition: Stable Ambulatory Status: Ambulatory Discharge Destination: Home Transportation: Private Auto Accompanied By: self Schedule Follow-up Appointment: Yes Medication Reconciliation completed and provided to Patient/Care Yes Augusta Hilbert: Provided on Clinical Summary of Care: 06/30/2016 Form Type Recipient Paper Patient WD Electronic Signature(s) Signed: 06/30/2016 7:45:01 PM By: Gretta Cool, RN, [name], [name], [name] Previous Signature: 06/30/2016 8:56:49 AM Version By: Ruthine Dose Entered By: Gretta Cool RN, [name], [name] on 06/30/2016 10:17:01 Karge, [name] ([id number]) -------------------------------------------------------------------------------- Lower Extremity Assessment Details [place], Date of Service: 06/30/2016 8:15 AM Patient Name: [name] Patient Account Number: [id number] Medical Record Treating RN: [name] [id number] Number: Other Clinician: Date of Birth/Sex: [name] (65 y.o. Male) Treating [name], [name] Primary Care [name]: [name] [name]/Extender: G Referring [name]: [name] in Treatment: 2 Edema Assessment Assessed: [Left: No] [Right: No] E[Left: dema] [Right: :] Calf Left: Right: Point of Measurement: 38 cm From Medial Instep cm 50 cm Ankle Left: Right: Point of Measurement: 12 cm From Medial Instep cm 32 cm Vascular Assessment Claudication: Claudication Assessment [Right:None] Pulses: Dorsalis Pedis Palpable: [Right:Yes] Posterior Tibial Palpable: [Right:Yes] Extremity colors, hair growth, and conditions: Extremity Color: [Right:Hyperpigmented] Hair Growth on Extremity: [Right:No] Temperature of Extremity: [Right:Warm] Capillary Refill: [Right:< 3 seconds] Dependent Rubor: [Right:No] Blanched when Elevated: [Right:No] Lipodermatosclerosis: [Right:No] Toe Nail Assessment Left: Right: Thick: Yes Discolored: Yes Deformed: Yes [name] ([id number]) Improper Length and Hygiene: Yes  Electronic Signature(s) Signed: 06/30/2016 7:45:01 PM By: Gretta Cool, RN, [name], [name], [name] Entered By: Gretta Cool, RN, [name], [name] on 06/30/2016 08:26:29 [name] ([id number]) -------------------------------------------------------------------------------- Multi Wound Chart Details [place], Date of Service: 06/30/2016 8:15 AM Patient Name: [name] Patient Account Number: [id number] Medical Record Treating RN: [name] [id number] Number: Other Clinician: Date of Birth/Sex: [name] (65 y.o. Male) Treating [name], [name] Primary Care Daiveon Markman: [name] [name]/Extender: G Referring [name]: [name] in Treatment: 2 Vital Signs Height(in): 74 Pulse(bpm): 67 Weight(lbs): 252 Blood Pressure 148/99 (mmHg): Body Mass Index(BMI): 32 Temperature(F): Respiratory Rate 18 (breaths/min): Photos: [N/A:N/A] Wound Location: Right Lower Leg - Medial Right Lower Leg - N/A Posterior Wounding Event: Gradually Appeared Gradually Appeared N/A Primary Etiology: Venous Leg Ulcer Venous Leg Ulcer N/A Comorbid History:  Arrhythmia, Hypertension, Arrhythmia, Hypertension, N/A Gout, Neuropathy Gout, Neuropathy Date Acquired: 06/02/2016 06/23/2016 N/A [name] of Treatment: 2 0 N/A Wound Status: Open Open N/A Clustered Wound: No Yes N/A Clustered Quantity: N/A 2 N/A Measurements L x W x D 2x1.2x0.2 1.5x1x0.1 N/A (cm) Area (cm) : 1.885 1.178 N/A Volume (cm) : 0.377 0.118 N/A % Reduction in Area: 27.30% N/A N/A % Reduction in Volume: -45.60% N/A N/A Classification: Partial Thickness Partial Thickness N/A Exudate Amount: Large Medium N/A Exudate Type: Serous Serous N/A Exudate Color: amber amber N/A Wound Margin: Flat and Intact Flat and Intact N/A Mccurley, [place] ([id number]) Granulation Amount: Large (67-100%) None Present (0%) N/A Granulation Quality: Red N/A N/A Necrotic Amount: Small (1-33%) Small (1-33%) N/A Exposed Structures: Fat Layer (Subcutaneous Fascia: No N/A Tissue) Exposed: Yes Fat Layer (Subcutaneous Fascia: No Tissue) Exposed: No Tendon: No Tendon: No Muscle: No Muscle: No Joint: No Joint: No Bone: No Bone: No Limited to Skin Breakdown Epithelialization: None None N/A Periwound Skin Texture: Scarring: Yes Excoriation: Yes N/A Excoriation: No Induration: No Induration: No Callus: No Callus: No Crepitus: No Crepitus: No Rash: No Rash: No Scarring: No Periwound Skin Maceration: No Maceration: No N/A Moisture: Dry/Scaly: No Dry/Scaly: No Periwound Skin Color: Hemosiderin Staining: Yes Atrophie Blanche: No N/A Atrophie Blanche: No Cyanosis: No Cyanosis: No Ecchymosis: No Ecchymosis: No Erythema: No Erythema: No Hemosiderin Staining: No Mottled: No Mottled: No Pallor: No Pallor: No Rubor: No Rubor: No Temperature: No Abnormality N/A N/A Tenderness on Yes No N/A Palpation: Wound Preparation: Ulcer Cleansing: Ulcer Cleansing: N/A Rinsed/Irrigated with Rinsed/Irrigated with Saline, Other: soap and Saline water Topical Anesthetic Topical Anesthetic  Applied: None Applied: Other: lidocaine 4% Treatment Notes Electronic Signature(s) Signed: 06/30/2016 7:45:01 PM By: Gretta Cool, RN, [name], [name], [name] Entered By: Gretta Cool, RN, [name], [name] on 06/30/2016 08:35:26 Weyandt, [name] ([id number]) -------------------------------------------------------------------------------- Multi-Disciplinary Care Plan Details [place], Date of Service: 06/30/2016 8:15 AM Patient Name: [name] Patient Account Number: [id number] Medical Record Treating RN: [name] [id number] Number: Other Clinician: Date of Birth/Sex: [name] (66 y.o. Male) Treating [name], [name] Primary Care Korey Prashad: [name] Tabbitha Janvrin/Extender: G Referring Tereso Unangst: [name] in Treatment: 2 Active Inactive ` Orientation to the Wound Care Program Nursing Diagnoses: Knowledge deficit related to the wound healing center program Goals: Patient/caregiver will verbalize understanding of the [facility] Date Initiated: 06/16/2016 Target Resolution Date: 08/15/2016 Goal Status: Active Interventions: Provide education on orientation to the wound center Notes: ` Venous Leg Ulcer Nursing Diagnoses: Potential for venous Insuffiency (use before diagnosis confirmed) Goals: Patient will maintain optimal edema control Date Initiated: 06/16/2016 Target Resolution Date: 08/19/2016 Goal Status: Active Interventions: Compression as ordered Notes: ` Wound/Skin Impairment Nursing Diagnoses: [place], [place] ([id number]) Impaired tissue integrity Goals:  Patient/caregiver will verbalize understanding of skin care regimen Date Initiated: 06/16/2016 Target Resolution Date: 07/15/2016 Goal Status: Active Ulcer/skin breakdown will have a volume reduction of 30% by week 4 Date Initiated: 06/16/2016 Target Resolution Date: 07/15/2016 Goal Status: Active Ulcer/skin breakdown will have a volume reduction of 50% by week 8 Date Initiated: 06/16/2016 Target Resolution  Date: 07/15/2016 Goal Status: Active Ulcer/skin breakdown will have a volume reduction of 80% by week 12 Date Initiated: 06/16/2016 Target Resolution Date: 07/15/2016 Goal Status: Active Ulcer/skin breakdown will heal within 14 [name] Date Initiated: 06/16/2016 Target Resolution Date: 07/15/2016 Goal Status: Active Interventions: Assess patient/caregiver ability to obtain necessary supplies Assess patient/caregiver ability to perform ulcer/skin care regimen upon admission and as needed Assess ulceration(s) every visit Notes: Electronic Signature(s) Signed: 06/30/2016 7:45:01 PM By: Gretta Cool, RN, [name], [name], [name] Entered By: Gretta Cool, RN, [name], [name] on 06/30/2016 08:40:28 Tiedt, [name] ([id number]) -------------------------------------------------------------------------------- Pain Assessment Details [place], Date of Service: 06/30/2016 8:15 AM Patient Name: [name] Patient Account Number: [id number] Medical Record Treating RN: [name] [id number] Number: Other Clinician: Date of Birth/Sex: [birth year] (66 y.o. Male) Treating [name] Primary Care Deeanna Beightol: [name] Roselina Burgueno/Extender: G Referring Lillee Mooneyhan: [name] in Treatment: 2 Active Problems Location of Pain Severity and Description of Pain Patient Has Paino Yes Site Locations Pain Location: Generalized Pain With Dressing Change: Yes Pain Management and Medication Current Pain Management: Goals for Pain Management Topical or injectable lidocaine is offered to patient for acute pain when surgical debridement is performed. If needed, Patient is instructed to use over the counter pain medication for the following 24-48 hours after debridement. Wound care MDs do not prescribed pain medications. Patient has chronic pain or uncontrolled pain. Patient has been instructed to make an appointment with their Primary Care Physician for pain management. Electronic Signature(s) Signed: 06/30/2016 7:45:01 PM By:  Gretta Cool, RN, [name], [name], [name] Entered By: Gretta Cool, RN, [name], [name] on 06/30/2016 08:15:16 Dillavou, [name] ([id number]) -------------------------------------------------------------------------------- Patient/Caregiver Education Details [place], Date of Service: 06/30/2016 8:15 AM Patient Name: [name] Patient Account Number: [id number] Medical Record Treating RN: [name] [id number] Number: Other Clinician: Date of Birth/Gender: [name] (66 y.o. Male) Treating [name], [name] Primary Care Physician/Extender: [name] Physician: Suella Grove in Treatment: 2 Referring Physician: Royetta [name] Education Assessment Education Provided To: Patient Education Topics Provided Venous: Handouts: Controlling Swelling with Multilayered Compression Wraps Methods: Demonstration, Explain/Verbal Responses: State content correctly Wound/Skin Impairment: Handouts: Caring for Your Ulcer Methods: Demonstration Responses: State content correctly Notes Patient has been asked to make an appointment with his PCP ASAP. I will send notes and copies of xrays to [facility] today,. Electronic Signature(s) Signed: 06/30/2016 7:45:01 PM By: Gretta Cool, RN, [name], [name], [name] Entered By: Gretta Cool, RN, [name], [name] on 06/30/2016 10:18:10 Kommer, [name] ([id number]) -------------------------------------------------------------------------------- Wound Assessment Details [place], Date of Service: 06/30/2016 8:15 AM Patient Name: [name] Patient Account Number: [id number] Medical Record Treating RN: [name] [id number] Number: Other Clinician: Date of Birth/Sex: [birth year] (66 y.o. Male) Treating [name], [place] Primary Care Maxon Kresse: [name] Emery Binz/Extender: G Referring Latiqua Daloia: [name] in Treatment: 2 Wound Status Wound Number: 4 Primary Venous Leg Ulcer Etiology: Wound Location: Right Lower Leg - Medial Wound Status: Open Wounding Event: Gradually  Appeared Comorbid Arrhythmia, Hypertension, Gout, Date Acquired: 06/02/2016 History: Neuropathy [name] Of Treatment: 2 Clustered Wound: No Photos Wound Measurements Length: (cm) 2 Width: (cm) 1.2 Depth: (cm) 0.2 Area: (cm) 1.885 Volume: (cm) 0.377 % Reduction in Area: 27.3% % Reduction in Volume: -45.6% Epithelialization: None Wound  Description Classification: Partial Thickness Wound Margin: Flat and Intact Exudate Amount: Large Exudate Type: Serous Exudate Color: amber Foul Odor After Cleansing: No Slough/Fibrino Yes Wound Bed Granulation Amount: Large (67-100%) Exposed Structure Granulation Quality: Red Fascia Exposed: No Necrotic Amount: Small (1-33%) Fat Layer (Subcutaneous Tissue) Exposed: Yes Necrotic Quality: Adherent Slough Tendon Exposed: No Muscle Exposed: No Joint Exposed: No Sossamon, Miachel ([id number]) Bone Exposed: No Periwound Skin Texture Texture Color No Abnormalities Noted: No No Abnormalities Noted: No Callus: No Atrophie Blanche: No Crepitus: No Cyanosis: No Excoriation: No Ecchymosis: No Induration: No Erythema: No Rash: No Hemosiderin Staining: Yes Scarring: Yes Mottled: No Pallor: No Moisture Rubor: No No Abnormalities Noted: No Dry / Scaly: No Temperature / Pain Maceration: No Temperature: No Abnormality Tenderness on Palpation: Yes Wound Preparation Ulcer Cleansing: Rinsed/Irrigated with Saline, Other: soap and water, Topical Anesthetic Applied: Other: lidocaine 4%, Treatment Notes Wound #4 (Right, Medial Lower Leg) 1. Cleansed with: Cleanse wound with antibacterial soap and water 2. Anesthetic Topical Lidocaine 4% cream to wound bed prior to debridement 4. Dressing Applied: Hydrafera Blue 5. Secondary Dressing Applied ABD Pad 7. Secured with 3 Layer Compression System - Right Lower Extremity Electronic Signature(s) Signed: 06/30/2016 7:45:01 PM By: Gretta Cool, RN, [name], [name], [name] Entered By: Gretta Cool, RN, [name], [name]  on 06/30/2016 08:22:07 [place], [name] ([id number]) -------------------------------------------------------------------------------- Wound Assessment Details [place], Date of Service: 06/30/2016 8:15 AM Patient Name: [name] Patient Account Number: [id number] Medical Record Treating RN: [name] [id number] Number: Other Clinician: Date of Birth/Sex: [name] (66 y.o. Male) Treating [name], [name] Primary Care Jasamine Pottinger: [name] Gabriellah Rabel/Extender: G Referring Gram Siedlecki: [name] in Treatment: 2 Wound Status Wound Number: 5 Primary Venous Leg Ulcer Etiology: Wound Location: Right Lower Leg - Posterior Wound Status: Open Wounding Event: Gradually Appeared Comorbid Arrhythmia, Hypertension, Gout, Date Acquired: 06/23/2016 History: Neuropathy [name] Of Treatment: 0 Clustered Wound: Yes Photos Wound Measurements Length: (cm) 1.5 Width: (cm) 1 Depth: (cm) 0.1 Clustered Quantity: 2 Area: (cm) 1.178 Volume: (cm) 0.118 % Reduction in Area: % Reduction in Volume: Epithelialization: None Tunneling: No Undermining: No Wound Description Classification: Partial Thickness Wound Margin: Flat and Intact Exudate Amount: Medium Exudate Type: Serous Exudate Color: amber Foul Odor After Cleansing: No Slough/Fibrino No Wound Bed Granulation Amount: None Present (0%) Exposed Structure Necrotic Amount: Small (1-33%) Fascia Exposed: No Necrotic Quality: Adherent Slough Fat Layer (Subcutaneous Tissue) Exposed: No Tendon Exposed: No Muscle Exposed: No Cerro, Jaber ([id number]) Joint Exposed: No Bone Exposed: No Limited to Skin Breakdown Periwound Skin Texture Texture Color No Abnormalities Noted: No No Abnormalities Noted: No Callus: No Atrophie Blanche: No Crepitus: No Cyanosis: No Excoriation: Yes Ecchymosis: No Induration: No Erythema: No Rash: No Hemosiderin Staining: No Scarring: No Mottled: No Pallor: No Moisture Rubor: No No  Abnormalities Noted: No Dry / Scaly: No Maceration: No Wound Preparation Ulcer Cleansing: Rinsed/Irrigated with Saline Topical Anesthetic Applied: None Treatment Notes Wound #5 (Right, Posterior Lower Leg) 1. Cleansed with: Cleanse wound with antibacterial soap and water 2. Anesthetic Topical Lidocaine 4% cream to wound bed prior to debridement 4. Dressing Applied: Hydrafera Blue 5. Secondary Dressing Applied ABD Pad 7. Secured with 3 Layer Compression System - Right Lower Extremity Electronic Signature(s) Signed: 06/30/2016 7:45:01 PM By: Gretta Cool, RN, [name], [name], [name] Entered By: Gretta Cool, RN, [name], [name] on 06/30/2016 08:24:05 Skiff, [name] ([id number]) -------------------------------------------------------------------------------- Vitals Details [place], Date of Service: 06/30/2016 8:15 AM Patient Name: [name] Patient Account Number: [id number] Medical Record Treating RN: [name] [id number] Number: Other Clinician: Date of Birth/Sex: [name] (  66 y.o. Male) Treating [name], [name] Primary Care Anaid Haney: [name] Hezekiah Veltre/Extender: G Referring Tahni Porchia: [name] in Treatment: 2 Vital Signs Time Taken: 08:15 Pulse (bpm): 67 Height (in): 74 Respiratory Rate (breaths/min): 18 Weight (lbs): 252 Blood Pressure (mmHg): 148/99 Body Mass Index (BMI): 32.4 Reference Range: 80 - 120 mg / dl Electronic Signature(s) Signed: 06/30/2016 7:45:01 PM By: Gretta Cool, RN, [name], [name], [name] Entered By: Gretta Cool, RN, [name], [name] on 06/30/2016 08:19:48
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
## 2016-07-05 ENCOUNTER — Other Ambulatory Visit: Payer: Self-pay | Admitting: Internal Medicine

## 2016-07-05 ENCOUNTER — Ambulatory Visit (INDEPENDENT_AMBULATORY_CARE_PROVIDER_SITE_OTHER): Payer: Medicare Other

## 2016-07-05 DIAGNOSIS — M79604 Pain in right leg: Secondary | ICD-10-CM

## 2016-07-06 LAB — VAS US LOWER EXTREMITY ARTERIAL DUPLEX
RIGHT SUPER FEMORAL DIST EDV: -7 cm/s
RIGHT SUPER FEMORAL MID EDV: -4 cm/s
RIGHT SUPER FEMORAL PROX EDV: 0 cm/s
RPOPPPSV: 81 cm/s
RSFDPSV: -77 cm/s
RSFMPSV: -107 cm/s
RSFPPSV: -138 cm/s
RTPOPDISDIA: -5 cm/s
RTPOPPROXDIA: 9 cm/s
Right peroneal sys PSV: -61 cm/s
Right peroneal sys min: -9 m/s
Right popliteal dist sys PSV: -122 cm/s

## 2016-07-07 ENCOUNTER — Encounter: Payer: Medicare Other | Admitting: Internal Medicine

## 2016-07-07 DIAGNOSIS — I87331 Chronic venous hypertension (idiopathic) with ulcer and inflammation of right lower extremity: Secondary | ICD-10-CM | POA: Diagnosis not present

## 2016-07-08 ENCOUNTER — Encounter (INDEPENDENT_AMBULATORY_CARE_PROVIDER_SITE_OTHER): Payer: Self-pay | Admitting: Vascular Surgery
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
## 2016-07-08 NOTE — Progress Notes (Signed)
ALDRIN, ENGELHARD ([id number]) Visit Report for 07/07/2016 Arrival Information Details [name], Date of Service: 07/07/2016 8:15 AM Patient Name: [name] Patient Account Number: [id number] Medical Record Treating RN: [name] [id number] Number: Other Clinician: Date of Birth/Sex: 1951-01-17 (66 y.o. Male) Treating [name] Primary Care [name]: [name] [name]/Extender: G Referring [name]: [name] in Treatment: 3 Visit Information History Since Last Visit Added or deleted any medications: No Patient Arrived: Ambulatory Any new allergies or adverse reactions: No Arrival Time: 08:17 Had a fall or experienced change in No Accompanied By: self activities of daily living that may affect Transfer Assistance: None risk of falls: Patient Identification Verified: Yes Signs or symptoms of abuse/neglect since last No Secondary Verification Process Yes visito Completed: Hospitalized since last visit: No Patient Has Alerts: Yes Pain Present Now: Yes Patient Alerts: Patient on Blood Thinner warfarin Electronic Signature(s) Signed: 07/07/2016 10:44:59 AM By: Mark Cool, RN, [name], [name] Entered By: Mark Cool, RN, [name], Mark on 07/07/2016 08:17:39 Stitt, [name] ([id number]) -------------------------------------------------------------------------------- Compression Therapy Details [name], Date of Service: 07/07/2016 8:15 AM Patient Name: [name] Patient Account Number: [id number] Medical Record Treating RN: [name] [id number] Number: Other Clinician: Date of Birth/Sex: [birth year] (66 y.o. Male) Treating [name] Primary Care Sheretta Grumbine: [name] Lynise [name]/Extender: G Referring [name]: [name] in Treatment: 3 Compression Therapy Performed for Wound Wound #4 Right,Medial Lower Leg Assessment: Performed By: Clinician Mark Barman, RN Compression Type: Three Layer Pre Treatment ABI: 1 Post Procedure Diagnosis Same as  Pre-procedure Electronic Signature(s) Signed: 07/07/2016 10:44:59 AM By: Mark Cool, RN, [name], [name] Entered By: Mark Cool, RN, [name], Mark on 07/07/2016 08:50:33 [name], [name] ([id number]) -------------------------------------------------------------------------------- Encounter Discharge Information Details [name], Date of Service: 07/07/2016 8:15 AM Patient Name: [name] Patient Account Number: [id number] Medical Record Treating RN: Baruch Gouty RN, [name], [name] [id number] Number: Other Clinician: Date of Birth/Sex: 1950/08/20 (66 y.o. Male) Treating [name] Primary Care Lache Dagher: [name] [name]/Extender: G Referring Jameyah Fennewald: [name] in Treatment: 3 Encounter Discharge Information Items Discharge Pain Level: 3 Discharge Condition: Stable Ambulatory Status: Ambulatory Discharge Destination: Home Transportation: Private Auto Accompanied By: self Schedule Follow-up Appointment: Yes Medication Reconciliation completed and provided to Patient/Care Yes [name]: Provided on Clinical Summary of Care: 07/07/2016 Form Type Recipient Paper Patient WD Electronic Signature(s) Signed: 07/07/2016 10:44:59 AM By: Mark Cool, RN, [name], [name] Previous Signature: 07/07/2016 8:47:11 AM Version By: Ruthine Dose Entered By: Mark Cool RN, [name], Mark on 07/07/2016 08:51:39 Kenley, [name] ([id number]) -------------------------------------------------------------------------------- Lower Extremity Assessment Details Pardon, Date of Service: 07/07/2016 8:15 AM Patient Name: [name] Patient Account Number: [id number] Medical Record Treating RN: [name] [id number] Number: Other Clinician: Date of Birth/Sex: 06/03/1950 (65 y.o. Male) Treating [name], [name] Primary Care Lashun Ramseyer: [name] Jasminemarie Sherrard/Extender: G Referring Jolyne Laye: [name] in Treatment: 3 Edema Assessment Assessed: [Left: No] [Right: No] E[Left: dema] [Right: :] Calf Left:  Right: Point of Measurement: 38 cm From Medial Instep cm 45.5 cm Ankle Left: Right: Point of Measurement: 12 cm From Medial Instep cm 31 cm Vascular Assessment Claudication: Claudication Assessment [Right:None] Pulses: Dorsalis Pedis Palpable: [Right:Yes] Posterior Tibial Extremity colors, hair growth, and conditions: Extremity Color: [Right:Hyperpigmented] Hair Growth on Extremity: [Right:Yes] Temperature of Extremity: [Right:Warm] Capillary Refill: [Right:< 3 seconds] Dependent Rubor: [Right:No] Blanched when Elevated: [Right:No] Lipodermatosclerosis: [Right:No] Toe Nail Assessment Left: Right: Thick: Yes Discolored: Yes Deformed: Yes Improper Length and Hygiene: Yes [name] ([id number]) Electronic Signature(s) Signed: 07/07/2016 10:44:59 AM By: Mark Cool, RN, [name], [name] Entered By: Mark Cool, RN, [name], Mark on 07/07/2016 16:07:37  [name] ([id number]) -------------------------------------------------------------------------------- Multi Wound Chart Details [name], Date of Service: 07/07/2016 8:15 AM Patient Name: [name] Patient Account Number: [id number] Medical Record Treating RN: [name] [id number] Number: Other Clinician: Date of Birth/Sex: [birth year] (66 y.o. Male) Treating [name] Primary Care [name]: [name] [name]/Extender: G Referring [name]: [name] in Treatment: 3 Vital Signs Height(in): 74 Pulse(bpm): 64 Weight(lbs): 252 Blood Pressure 139/91 (mmHg): Body Mass Index(BMI): 32 Temperature(F): 97.6 Respiratory Rate 16 (breaths/min): Photos: [N/A:N/A] Wound Location: Right Lower Leg - Medial Right Lower Leg - N/A Posterior Wounding Event: Gradually Appeared Gradually Appeared N/A Primary Etiology: Venous Leg Ulcer Venous Leg Ulcer N/A Comorbid History: Arrhythmia, Hypertension, Arrhythmia, Hypertension, N/A Gout, Neuropathy Gout, Neuropathy Date Acquired: 06/02/2016 06/23/2016 N/A Weeks of  Treatment: 3 1 N/A Wound Status: Open Healed - Epithelialized N/A Clustered Wound: No Yes N/A Clustered Quantity: N/A 2 N/A Measurements L x W x D 2.2x1.5x0.2 0x0x0 N/A (cm) Area (cm) : 2.592 0 N/A Volume (cm) : 0.518 0 N/A % Reduction in Area: 0.00% 100.00% N/A % Reduction in Volume: -100.00% 100.00% N/A Classification: Partial Thickness Partial Thickness N/A Exudate Amount: Large Medium N/A Exudate Type: Serous Serous N/A Exudate Color: amber amber N/A Wound Margin: Flat and Intact Flat and Intact N/A Guandique, Harm ([id number]) Granulation Amount: Large (67-100%) None Present (0%) N/A Granulation Quality: Red N/A N/A Necrotic Amount: Small (1-33%) None Present (0%) N/A Exposed Structures: Fat Layer (Subcutaneous Fascia: No N/A Tissue) Exposed: Yes Fat Layer (Subcutaneous Fascia: No Tissue) Exposed: No Tendon: No Tendon: No Muscle: No Muscle: No Joint: No Joint: No Bone: No Bone: No Limited to Skin Breakdown Epithelialization: Small (1-33%) Large (67-100%) N/A Periwound Skin Texture: Scarring: Yes Excoriation: Yes N/A Excoriation: No Induration: No Induration: No Callus: No Callus: No Crepitus: No Crepitus: No Rash: No Rash: No Scarring: No Periwound Skin Maceration: No Maceration: No N/A Moisture: Dry/Scaly: No Dry/Scaly: No Periwound Skin Color: Hemosiderin Staining: Yes Atrophie Blanche: No N/A Atrophie Blanche: No Cyanosis: No Cyanosis: No Ecchymosis: No Ecchymosis: No Erythema: No Erythema: No Hemosiderin Staining: No Mottled: No Mottled: No Pallor: No Pallor: No Rubor: No Rubor: No Temperature: No Abnormality N/A N/A Tenderness on Yes No N/A Palpation: Wound Preparation: Ulcer Cleansing: Ulcer Cleansing: N/A Rinsed/Irrigated with Rinsed/Irrigated with Saline, Other: soap and Saline water Topical Anesthetic Topical Anesthetic Applied: None Applied: Other: lidocaine 4% Treatment Notes Electronic Signature(s) Signed:  07/07/2016 4:59:22 PM By: Mark Ham MD Entered By: [name] on 07/07/2016 08:43:00 Kosar, [name] -------------------------------------------------------------------------------- Multi-Disciplinary Care Plan Details [place], Date of Service: 07/07/2016 8:15 AM Patient Name: [name] Patient Account Number: [id number] Medical Record Treating RN: [name] [id number] Number: Other Clinician: Date of Birth/Sex: [birth year] (66 y.o. Male) Treating [name], MICHAEL Primary Care Shunna Mikaelian: [name] Lacretia Tindall/Extender: G Referring Ciria Bernardini: [name] in Treatment: 3 Active Inactive ` Orientation to the Wound Care Program Nursing Diagnoses: Knowledge deficit related to the wound healing center program Goals: Patient/caregiver will verbalize understanding of the [facility] Program Date Initiated: 06/16/2016 Target Resolution Date: 08/15/2016 Goal Status: Active Interventions: Provide education on orientation to the wound center Notes: ` Venous Leg Ulcer Nursing Diagnoses: Potential for venous Insuffiency (use before diagnosis confirmed) Goals: Patient will maintain optimal edema control Date Initiated: 06/16/2016 Target Resolution Date: 08/19/2016 Goal Status: Active Interventions: Compression as ordered Notes: ` Wound/Skin Impairment Nursing Diagnoses: JOSEP, [name] ([id number]) Impaired tissue integrity Goals: Patient/caregiver will verbalize understanding of skin care regimen Date Initiated: 06/16/2016 Target Resolution Date: 07/15/2016 Goal Status: Active Ulcer/skin breakdown will  have a volume reduction of 30% by week 4 Date Initiated: 06/16/2016 Target Resolution Date: 07/15/2016 Goal Status: Active Ulcer/skin breakdown will have a volume reduction of 50% by week 8 Date Initiated: 06/16/2016 Target Resolution Date: 07/15/2016 Goal Status: Active Ulcer/skin breakdown will have a volume reduction of 80% by week 12 Date  Initiated: 06/16/2016 Target Resolution Date: 07/15/2016 Goal Status: Active Ulcer/skin breakdown will heal within 14 weeks Date Initiated: 06/16/2016 Target Resolution Date: 07/15/2016 Goal Status: Active Interventions: Assess patient/caregiver ability to obtain necessary supplies Assess patient/caregiver ability to perform ulcer/skin care regimen upon admission and as needed Assess ulceration(s) every visit Notes: Electronic Signature(s) Signed: 07/07/2016 10:44:59 AM By: Mark Cool, RN, [name], [name] Entered By: Mark Cool, RN, [name], Mark on 07/07/2016 08:28:25 Koral, [name] ([id number]) -------------------------------------------------------------------------------- Pain Assessment Details Dark, Date of Service: 07/07/2016 8:15 AM Patient Name: [name] Patient Account Number: [id number] Medical Record Treating RN: [name] [id number] Number: Other Clinician: Date of Birth/Sex: 07-Oct-1950 (66 y.o. Male) Treating [name] Primary Care Miana Politte: [name] Barkley Kratochvil/Extender: G Referring Tyechia Allmendinger: [name] in Treatment: 3 Active Problems Location of Pain Severity and Description of Pain Patient Has Paino Yes Site Locations Pain Location: Generalized Pain, Pain in Ulcers With Dressing Change: Yes Rate the pain. Current Pain Level: 5 Pain Management and Medication Current Pain Management: Goals for Pain Management Topical or injectable lidocaine is offered to patient for acute pain when surgical debridement is performed. If needed, Patient is instructed to use over the counter pain medication for the following 24-48 hours after debridement. Wound care MDs do not prescribed pain medications. Patient has chronic pain or uncontrolled pain. Patient has been instructed to make an appointment with their Primary Care Physician for pain management. Electronic Signature(s) Signed: 07/07/2016 10:44:59 AM By: Mark Cool, RN, [name], [name] Entered By: Mark Cool, RN, [name],  Mark on 07/07/2016 08:17:56 Older, [name] ([id number]) -------------------------------------------------------------------------------- Patient/Caregiver Education Details [place], Date of Service: 07/07/2016 8:15 AM [facility] Name: [name] Patient Account Number: [id number] Medical Record Treating RN: [name] [id number] Number: Other Clinician: Date of Birth/Gender: 07/01/1950 (66 y.o. Male) Treating [name], MICHAEL Primary Care Physician/Extender: Tonye Pearson Physician: Suella Grove in Treatment: 3 Referring Physician: Royetta [name] Education Assessment Education Provided To: Patient Education Topics Provided Venous: Handouts: Controlling Swelling with Multilayered Compression Wraps Methods: Demonstration, Explain/Verbal Responses: State content correctly Wound/Skin Impairment: Electronic Signature(s) Signed: 07/07/2016 10:44:59 AM By: Mark Cool, RN, [name], [name] Entered By: Mark Cool, RN, [name], Mark on 07/07/2016 08:51:53 Francom, [name] ([id number]) -------------------------------------------------------------------------------- Wound Assessment Details [place], Date of Service: 07/07/2016 8:15 AM Patient Name: [name] Patient Account Number: [id number] Medical Record Treating RN: [name] [id number] Number: Other Clinician: Date of Birth/Sex: 1951/05/15 (66 y.o. Male) Treating [name], [place] Primary Care Haddy Mullinax: [name] Willis Holquin/Extender: G Referring Nichlas Pitera: [name] in Treatment: 3 Wound Status Wound Number: 4 Primary Venous Leg Ulcer Etiology: Wound Location: Right Lower Leg - Medial Wound Status: Open Wounding Event: Gradually Appeared Comorbid Arrhythmia, Hypertension, Gout, Date Acquired: 06/02/2016 History: Neuropathy Weeks Of Treatment: 3 Clustered Wound: No Photos Wound Measurements Length: (cm) 2.2 Width: (cm) 1.5 Depth: (cm) 0.2 Area: (cm) 2.592 Volume: (cm) 0.518 % Reduction in Area: 0% % Reduction in Volume:  -100% Epithelialization: Small (1-33%) Tunneling: No Undermining: No Wound Description Classification: Partial Thickness Wound Margin: Flat and Intact Exudate Amount: Large Exudate Type: Serous Exudate Color: amber Foul Odor After Cleansing: No Slough/Fibrino Yes Wound Bed Granulation Amount: Large (67-100%) Exposed Structure Granulation Quality: Red Fascia Exposed: No Necrotic Amount: Small (1-33%) Fat  Layer (Subcutaneous Tissue) Exposed: Yes Necrotic Quality: Adherent Slough Tendon Exposed: No Muscle Exposed: No Joint Exposed: No Selvy, Harlee ([id number]) Bone Exposed: No Periwound Skin Texture Texture Color No Abnormalities Noted: No No Abnormalities Noted: No Callus: No Atrophie Blanche: No Crepitus: No Cyanosis: No Excoriation: No Ecchymosis: No Induration: No Erythema: No Rash: No Hemosiderin Staining: Yes Scarring: Yes Mottled: No Pallor: No Moisture Rubor: No No Abnormalities Noted: No Dry / Scaly: No Temperature / Pain Maceration: No Temperature: No Abnormality Tenderness on Palpation: Yes Wound Preparation Ulcer Cleansing: Rinsed/Irrigated with Saline, Other: soap and water, Topical Anesthetic Applied: Other: lidocaine 4%, Treatment Notes Wound #4 (Right, Medial Lower Leg) 1. Cleansed with: Cleanse wound with antibacterial soap and water 2. Anesthetic Topical Lidocaine 4% cream to wound bed prior to debridement 4. Dressing Applied: Hydrafera Blue 5. Secondary Dressing Applied ABD Pad 7. Secured with 3 Layer Compression System - Right Lower Extremity Electronic Signature(s) Signed: 07/07/2016 10:44:59 AM By: Mark Cool, RN, [name], [name] Entered By: Mark Cool, RN, [name], Mark on 07/07/2016 08:25:38 Smeltz, [name] ([id number]) -------------------------------------------------------------------------------- Wound Assessment Details [place], Date of Service: 07/07/2016 8:15 AM Patient Name: [name] Patient Account Number:  [id number] Medical Record Treating RN: [name] [id number] Number: Other Clinician: Date of Birth/Sex: Jan 18, 1951 (66 y.o. Male) Treating [name], MICHAEL Primary Care Kyleah Pensabene: [name] Jaiah Weigel/Extender: G Referring Carroll Lingelbach: [name] in Treatment: 3 Wound Status Wound Number: 5 Primary Venous Leg Ulcer Etiology: Wound Location: Right Lower Leg - Posterior Wound Status: Healed - Epithelialized Wounding Event: Gradually Appeared Comorbid Arrhythmia, Hypertension, Gout, Date Acquired: 06/23/2016 History: Neuropathy Weeks Of Treatment: 1 Clustered Wound: Yes Photos Wound Measurements Length: (cm) 0 % Reduction Width: (cm) 0 % Reduction Depth: (cm) 0 Epitheliali Clustered Quantity: 2 Tunneling: Area: (cm) 0 Underminin Volume: (cm) 0 in Area: 100% in Volume: 100% zation: Large (67-100%) No g: No Wound Description Classification: Partial Thickness Wound Margin: Flat and Intact Exudate Amount: Medium Exudate Type: Serous Exudate Color: amber Foul Odor After Cleansing: No Slough/Fibrino No Wound Bed Granulation Amount: None Present (0%) Exposed Structure Necrotic Amount: None Present (0%) Fascia Exposed: No Fat Layer (Subcutaneous Tissue) Exposed: No Tendon Exposed: No Muscle Exposed: No Cordy, Britten ([id number]) Joint Exposed: No Bone Exposed: No Limited to Skin Breakdown Periwound Skin Texture Texture Color No Abnormalities Noted: No No Abnormalities Noted: No Callus: No Atrophie Blanche: No Crepitus: No Cyanosis: No Excoriation: Yes Ecchymosis: No Induration: No Erythema: No Rash: No Hemosiderin Staining: No Scarring: No Mottled: No Pallor: No Moisture Rubor: No No Abnormalities Noted: No Dry / Scaly: No Maceration: No Wound Preparation Ulcer Cleansing: Rinsed/Irrigated with Saline Topical Anesthetic Applied: None Electronic Signature(s) Signed: 07/07/2016 10:44:59 AM By: Mark Cool, RN, [name], [name] Entered By: Mark Cool,  RN, [name], Mark on 07/07/2016 08:26:02 Poplaski, [name] ([id number]) -------------------------------------------------------------------------------- Vitals Details [place], Date of Service: 07/07/2016 8:15 AM Patient Name: [name] Patient Account Number: [id number] Medical Record Treating RN: [name] [id number] Number: Other Clinician: Date of Birth/Sex: 04-16-1951 (66 y.o. Male) Treating [name], MICHAEL Primary Care Myalee Stengel: [name] Teddi Badalamenti/Extender: G Referring Kalief Kattner: [name] in Treatment: 3 Vital Signs Time Taken: 08:15 Temperature (F): 97.6 Height (in): 74 Pulse (bpm): 64 Weight (lbs): 252 Respiratory Rate (breaths/min): 16 Body Mass Index (BMI): 32.4 Blood Pressure (mmHg): 139/91 Reference Range: 80 - 120 mg / dl Electronic Signature(s) Signed: 07/07/2016 10:44:59 AM By: Mark Cool, RN, [name], [name] Entered By: Mark Cool, RN, [name], Mark on 07/07/2016 57:32:20

## 2016-07-14 ENCOUNTER — Encounter: Payer: Medicare Other | Admitting: Internal Medicine

## 2016-07-14 DIAGNOSIS — I87331 Chronic venous hypertension (idiopathic) with ulcer and inflammation of right lower extremity: Secondary | ICD-10-CM | POA: Diagnosis not present

## 2016-07-15 NOTE — Progress Notes (Signed)
[name], [name] ([id number]) Visit Report for 07/14/2016 Arrival Information Details [place], Date of Service: 07/14/2016 8:15 AM Patient Name: [name] Patient Account Number: [id number] Medical Record Treating RN: Baruch Gouty, RN, [name], Velva Harman [id number] Number: Other Clinician: Date of Birth/Sex: 1950-05-26 (66 y.o. Male) Treating [name] Primary Care Yanessa Hocevar: Royetta Crochet Loye Vento/Extender: G Referring Celsa Nordahl: Doristine Locks in Treatment: 4 Visit Information History Since Last Visit All ordered tests and consults were completed: No Patient Arrived: Ambulatory Added or deleted any medications: No Arrival Time: 08:15 Any new allergies or adverse reactions: No Accompanied By: self Had a fall or experienced change in No Transfer Assistance: None activities of daily living that may affect Patient Identification Verified: Yes risk of falls: Secondary Verification Process Yes Signs or symptoms of abuse/neglect since last No Completed: visito Patient Has Alerts: Yes Hospitalized since last visit: No Patient Alerts: Patient on Blood Has Dressing in Place as Prescribed: Yes Thinner Has Compression in Place as Prescribed: Yes warfarin Pain Present Now: No Electronic Signature(s) Signed: 07/14/2016 11:31:13 AM By: [name] [name], RN Entered By: [name] on 07/14/2016 08:15:29 [name], [name] ([id number]) -------------------------------------------------------------------------------- Encounter Discharge Information Details [place], Date of Service: 07/14/2016 8:15 AM Patient Name: [name] Patient Account Number: [id number] Medical Record Treating RN: Baruch Gouty, RN, [name], Velva Harman [id number] Number: Other Clinician: Date of Birth/Sex: 22-May-1950 (66 y.o. Male) Treating [name] Primary Care Alaa Eyerman: Royetta Crochet Shirell Struthers/Extender: G Referring Lavanda Nevels: Doristine Locks in Treatment: 4 Encounter Discharge Information Items Discharge Pain Level:  0 Discharge Condition: Stable Ambulatory Status: Ambulatory Discharge Destination: Home Transportation: Private Auto Accompanied By: SELF Schedule Follow-up Appointment: No Medication Reconciliation completed and provided to Patient/Care No [name]: Provided on Clinical Summary of Care: 07/14/2016 Form Type Recipient Paper Patient WD Electronic Signature(s) Signed: 07/14/2016 8:45:55 AM By: Ruthine Dose Entered By: Ruthine Dose on 07/14/2016 08:45:55 [name], [name] ([id number]) -------------------------------------------------------------------------------- Lower Extremity Assessment Details [name], Date of Service: 07/14/2016 8:15 AM Patient Name: [name] Patient Account Number: [id number] Medical Record Treating RN: Baruch Gouty, RN, [name], Velva Harman [id number] Number: Other Clinician: Date of Birth/Sex: 05/04/1951 (66 y.o. Male) Treating [name], MICHAEL Primary Care Lonzy Mato: Royetta Crochet Kylan Liberati/Extender: G Referring Lilliane Sposito: Royetta Crochet Weeks in Treatment: 4 Edema Assessment Assessed: [Left: No] [Right: No] E[Left: dema] [Right: :] Calf Left: Right: Point of Measurement: 38 cm From Medial Instep cm 45.4 cm Ankle Left: Right: Point of Measurement: 12 cm From Medial Instep cm 31 cm Vascular Assessment Claudication: Claudication Assessment [Right:None] Pulses: Posterior Tibial Extremity colors, hair growth, and conditions: Extremity Color: [Right:Dusky] Hair Growth on Extremity: [Right:Yes] Temperature of Extremity: [Right:Warm] Capillary Refill: [Right:< 3 seconds] Toe Nail Assessment Left: Right: Thick: Yes Discolored: Yes Deformed: Yes Improper Length and Hygiene: Yes Electronic Signature(s) Signed: 07/14/2016 8:28:33 AM By: [name] [name], RN Entered By: [name] on 07/14/2016 08:28:33 Frane, [name] ([id number]) [name], [name] ([id number]) -------------------------------------------------------------------------------- Multi Wound Chart  Details [place], Date of Service: 07/14/2016 8:15 AM Patient Name: [name] Patient Account Number: [id number] Medical Record Treating RN: Baruch Gouty RN, [name], Velva Harman [id number] Number: Other Clinician: Date of Birth/Sex: [birth year] (65 y.o. Male) Treating [name] Primary Care Adaja Wander: Royetta Crochet Valborg Friar/Extender: G Referring Fordyce Lepak: Royetta Crochet Weeks in Treatment: 4 Vital Signs Height(in): 74 Pulse(bpm): 60 Weight(lbs): 252 Blood Pressure 128/86 (mmHg): Body Mass Index(BMI): 32 Temperature(F): 98.4 Respiratory Rate 17 (breaths/min): Photos: [4:No Photos] [N/A:N/A] Wound Location: [4:Right Lower Leg - Medial N/A] Wounding Event: [4:Gradually Appeared] [N/A:N/A] Primary Etiology: [4:Venous Leg Ulcer] [N/A:N/A] Comorbid History: [4:Arrhythmia, Hypertension, N/A Gout, Neuropathy] Date Acquired: [4:06/02/2016] [N/A:N/A] Weeks of Treatment: [4:4] [  N/A:N/A] Wound Status: [4:Open] [N/A:N/A] Measurements L x W x D 2.4x2x0.2 [N/A:N/A] (cm) Area (cm) : [4:3.77] [N/A:N/A] Volume (cm) : [4:0.754] [N/A:N/A] % Reduction in Area: [4:-45.40%] [N/A:N/A] % Reduction in Volume: -191.10% [N/A:N/A] Classification: [4:Partial Thickness] [N/A:N/A] Exudate Amount: [4:Large] [N/A:N/A] Exudate Type: [4:Serous] [N/A:N/A] Exudate Color: [4:amber] [N/A:N/A] Wound Margin: [4:Flat and Intact] [N/A:N/A] Granulation Amount: [4:Large (67-100%)] [N/A:N/A] Granulation Quality: [4:Red] [N/A:N/A] Necrotic Amount: [4:Small (1-33%)] [N/A:N/A] Exposed Structures: [4:Fat Layer (Subcutaneous N/A Tissue) Exposed: Yes Fascia: No Tendon: No Muscle: No] Joint: No Bone: No Epithelialization: Small (1-33%) N/A N/A Debridement: Debridement ([id number]- N/A N/A 11047) Pre-procedure 08:31 N/A N/A Verification/Time Out Taken: Pain Control: Lidocaine 4% Topical N/A N/A Solution Tissue Debrided: Fibrin/Slough, N/A N/A Subcutaneous Level: Skin/Subcutaneous N/A N/A Tissue Debridement Area (sq 4.8 N/A  N/A cm): Instrument: Curette N/A N/A Bleeding: Minimum N/A N/A Hemostasis Achieved: Pressure N/A N/A Procedural Pain: 0 N/A N/A Post Procedural Pain: 0 N/A N/A Debridement Treatment Procedure was tolerated N/A N/A Response: well Post Debridement 2.4x2x0.2 N/A N/A Measurements L x W x D (cm) Post Debridement 0.754 N/A N/A Volume: (cm) Periwound Skin Texture: Scarring: Yes N/A N/A Excoriation: No Induration: No Callus: No Crepitus: No Rash: No Periwound Skin Maceration: No N/A N/A Moisture: Dry/Scaly: No Periwound Skin Color: Hemosiderin Staining: Yes N/A N/A Atrophie Blanche: No Cyanosis: No Ecchymosis: No Erythema: No Mottled: No Pallor: No Rubor: No Temperature: No Abnormality N/A N/A Tenderness on Yes N/A N/A Palpation: Wound Preparation: Ulcer Cleansing: N/A N/A Rinsed/Irrigated with Saline, Other: soap and [name], Jeremiyah ([id number]) water Topical Anesthetic Applied: Other: lidocaine 4% Procedures Performed: Debridement N/A N/A Treatment Notes Electronic Signature(s) Signed: 07/14/2016 3:56:26 PM By: [name] MD Previous Signature: 07/14/2016 8:28:44 AM Version By: [name] [name], RN Entered By: [name] on 07/14/2016 08:37:54 [name], [name] ([id number]) -------------------------------------------------------------------------------- Multi-Disciplinary Care Plan Details [place], Date of Service: 07/14/2016 8:15 AM Patient Name: [name] Patient Account Number: [id number] Medical Record Treating RN: Baruch Gouty RN, [name], Velva Harman [id number] Number: Other Clinician: Date of Birth/Sex: 13-Feb-1951 (66 y.o. Male) Treating [name] Primary Care Iyana Topor: Royetta Crochet Drina Jobst/Extender: G Referring Elester Apodaca: Doristine Locks in Treatment: 4 Active Inactive ` Orientation to the Wound Care Program Nursing Diagnoses: Knowledge deficit related to the wound healing center program Goals: Patient/caregiver will verbalize understanding  of the [place] Program Date Initiated: 06/16/2016 Target Resolution Date: 08/15/2016 Goal Status: Active Interventions: Provide education on orientation to the wound center Notes: ` Venous Leg Ulcer Nursing Diagnoses: Potential for venous Insuffiency (use before diagnosis confirmed) Goals: Patient will maintain optimal edema control Date Initiated: 06/16/2016 Target Resolution Date: 08/19/2016 Goal Status: Active Interventions: Compression as ordered Notes: ` Wound/Skin Impairment Nursing Diagnoses: KHIZAR, FIORELLA ([id number]) Impaired tissue integrity Goals: Patient/caregiver will verbalize understanding of skin care regimen Date Initiated: 06/16/2016 Target Resolution Date: 07/15/2016 Goal Status: Active Ulcer/skin breakdown will have a volume reduction of 30% by week 4 Date Initiated: 06/16/2016 Target Resolution Date: 07/15/2016 Goal Status: Active Ulcer/skin breakdown will have a volume reduction of 50% by week 8 Date Initiated: 06/16/2016 Target Resolution Date: 07/15/2016 Goal Status: Active Ulcer/skin breakdown will have a volume reduction of 80% by week 12 Date Initiated: 06/16/2016 Target Resolution Date: 07/15/2016 Goal Status: Active Ulcer/skin breakdown will heal within 14 weeks Date Initiated: 06/16/2016 Target Resolution Date: 07/15/2016 Goal Status: Active Interventions: Assess patient/caregiver ability to obtain necessary supplies Assess patient/caregiver ability to perform ulcer/skin care regimen upon admission and as needed Assess ulceration(s) every visit Notes: Electronic Signature(s) Signed: 07/14/2016 8:28:37 AM By: [name]  [name], RN Entered By: [name] on 07/14/2016 08:28:37 [name], [name] ([id number]) -------------------------------------------------------------------------------- Pain Assessment Details [place], Date of Service: 07/14/2016 8:15 AM Patient Name: [facility] Patient Account Number: [id number] Medical Record Treating  RN: Afful, RN, [name], Velva Harman [id number] Number: Other Clinician: Date of Birth/Sex: Jan 18, 1951 (66 y.o. Male) Treating [name], MICHAEL Primary Care Luc Shammas: Royetta Crochet Niquan Charnley/Extender: G Referring Safari Cinque: Doristine Locks in Treatment: 4 Active Problems Location of Pain Severity and Description of Pain Patient Has Paino No Site Locations With Dressing Change: No Pain Management and Medication Current Pain Management: Electronic Signature(s) Signed: 07/14/2016 11:31:13 AM By: [name] [name], RN Entered By: [name] on 07/14/2016 08:15:35 [name], [name] ([id number]) -------------------------------------------------------------------------------- Patient/Caregiver Education Details [name], Date of Service: 07/14/2016 8:15 AM Patient Name: [name] Patient Account Number: [id number] Medical Record Treating RN: Baruch Gouty, RN, [name], Velva Harman [id number] Number: Other Clinician: Date of Birth/Gender: 1951-01-20 (66 y.o. Male) Treating [name], MICHAEL Primary Care Physician/Extender: Tonye Pearson Physician: Suella [place] in Treatment: 4 Referring Physician: Royetta Crochet Education Assessment Education Provided To: Patient Education Topics Provided Welcome To The [place]: Methods: Explain/Verbal Responses: State content correctly Wound Debridement: Methods: Explain/Verbal Responses: State content correctly Wound/Skin Impairment: Methods: Explain/Verbal Responses: State content correctly Electronic Signature(s) Signed: 07/14/2016 11:31:13 AM By: [name] [name], RN Entered By: [name] on 07/14/2016 08:45:01 [name], [name] ([id number]) -------------------------------------------------------------------------------- Wound Assessment Details [place], Date of Service: 07/14/2016 8:15 AM Patient Name: [name] Patient Account Number: [id number] Medical Record Treating RN: Baruch Gouty, RN, [name], Velva Harman [id number] Number: Other Clinician: Date of Birth/Sex: [birth year]  (66 y.o. Male) Treating [name], MICHAEL Primary Care Eleesha Purkey: Royetta Crochet Sadeel Fiddler/Extender: G Referring Aniyla Harling: Doristine Locks in Treatment: 4 Wound Status Wound Number: 4 Primary Venous Leg Ulcer Etiology: Wound Location: Right Lower Leg - Medial Wound Status: Open Wounding Event: Gradually Appeared Comorbid Arrhythmia, Hypertension, Gout, Date Acquired: 06/02/2016 History: Neuropathy Weeks Of Treatment: 4 Clustered Wound: No Photos Photo Uploaded By: [name] on 07/14/2016 11:44:27 Wound Measurements Length: (cm) 2.4 Width: (cm) 2 Depth: (cm) 0.2 Area: (cm) 3.77 Volume: (cm) 0.754 % Reduction in Area: -45.4% % Reduction in Volume: -191.1% Epithelialization: Small (1-33%) Tunneling: No Undermining: No Wound Description Classification: Partial Thickness Wound Margin: Flat and Intact Exudate Amount: Large [name], Jansel ([id number]) Foul Odor After Cleansing: No Slough/Fibrino Yes Exudate Type: Serous Exudate Color: amber Wound Bed Granulation Amount: Large (67-100%) Exposed Structure Granulation Quality: Red Fascia Exposed: No Necrotic Amount: Small (1-33%) Fat Layer (Subcutaneous Tissue) Exposed: Yes Necrotic Quality: Adherent Slough Tendon Exposed: No Muscle Exposed: No Joint Exposed: No Bone Exposed: No Periwound Skin Texture Texture Color No Abnormalities Noted: No No Abnormalities Noted: No Callus: No Atrophie Blanche: No Crepitus: No Cyanosis: No Excoriation: No Ecchymosis: No Induration: No Erythema: No Rash: No Hemosiderin Staining: Yes Scarring: Yes Mottled: No Pallor: No Moisture Rubor: No No Abnormalities Noted: No Dry / Scaly: No Temperature / Pain Maceration: No Temperature: No Abnormality Tenderness on Palpation: Yes Wound Preparation Ulcer Cleansing: Rinsed/Irrigated with Saline, Other: soap and water, Topical Anesthetic Applied: Other: lidocaine 4%, Treatment Notes Wound #4 (Right, Medial Lower  Leg) 1. Cleansed with: Cleanse wound with antibacterial soap and water 3. Peri-wound Care: Moisturizing lotion 4. Dressing Applied: Prisma Ag 5. Secondary Dressing Applied ABD Pad Dry Gauze 7. Secured with 3 Layer Compression System - Right Lower Extremity Electronic Signature(s) Signed: 07/14/2016 11:31:13 AM By: [name] [name], RN [name], [place] ([id number]) Entered By: [name] on 07/14/2016 08:25:06 [name], [name] ([id number]) -------------------------------------------------------------------------------- Vitals Details [name], Date of Service: 07/14/2016 8:15 AM  Patient Name: [name] Patient Account Number: [id number] Medical Record Treating RN: Baruch Gouty, RN, [name], Velva Harman [id number] Number: Other Clinician: Date of Birth/Sex: [birth year] (66 y.o. Male) Treating [name], [place] Primary Care Zohaib Heeney: Royetta Crochet Fredna Stricker/Extender: G Referring Chibuike Fleek: Doristine Locks in Treatment: 4 Vital Signs Time Taken: 08:18 Temperature (F): 98.4 Height (in): 74 Pulse (bpm): 60 Weight (lbs): 252 Respiratory Rate (breaths/min): 17 Body Mass Index (BMI): 32.4 Blood Pressure (mmHg): 128/86 Reference Range: 80 - 120 mg / dl Electronic Signature(s) Signed: 07/14/2016 11:31:13 AM By: [name] [name], RN Entered By: [name] on 07/14/2016 08:18:38
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
## 2016-07-18 ENCOUNTER — Emergency Department: Payer: Medicare Other

## 2016-07-18 ENCOUNTER — Emergency Department
Admission: EM | Admit: 2016-07-18 | Discharge: 2016-07-18 | Disposition: A | Discharge status: Home / Self Care | Payer: Medicare Other | Attending: Student in an Organized Health Care Education/Training Program | Admitting: Student in an Organized Health Care Education/Training Program

## 2016-07-18 ENCOUNTER — Encounter: Payer: Self-pay | Admitting: Emergency Medicine

## 2016-07-18 DIAGNOSIS — M10031 Idiopathic gout, right wrist: Secondary | ICD-10-CM | POA: Insufficient documentation

## 2016-07-18 DIAGNOSIS — M109 Gout, unspecified: Secondary | ICD-10-CM

## 2016-07-18 DIAGNOSIS — M25531 Pain in right wrist: Secondary | ICD-10-CM | POA: Diagnosis present

## 2016-07-18 DIAGNOSIS — Z7901 Long term (current) use of anticoagulants: Secondary | ICD-10-CM | POA: Diagnosis not present

## 2016-07-18 DIAGNOSIS — E119 Type 2 diabetes mellitus without complications: Secondary | ICD-10-CM | POA: Insufficient documentation

## 2016-07-18 DIAGNOSIS — Z7982 Long term (current) use of aspirin: Secondary | ICD-10-CM | POA: Insufficient documentation

## 2016-07-18 DIAGNOSIS — I10 Essential (primary) hypertension: Secondary | ICD-10-CM | POA: Diagnosis not present

## 2016-07-18 DIAGNOSIS — F17210 Nicotine dependence, cigarettes, uncomplicated: Secondary | ICD-10-CM | POA: Diagnosis not present

## 2016-07-18 LAB — CBC WITH DIFFERENTIAL/PLATELET
Basophils Absolute: 0 10*3/uL (ref 0–0.1)
Basophils Relative: 1 %
EOS ABS: 0.1 10*3/uL (ref 0–0.7)
EOS PCT: 1 %
HCT: 39.5 % — ABNORMAL LOW (ref 40.0–52.0)
Hemoglobin: 12.5 g/dL — ABNORMAL LOW (ref 13.0–18.0)
LYMPHS ABS: 0.9 10*3/uL — AB (ref 1.0–3.6)
LYMPHS PCT: 18 %
MCH: 24.1 pg — AB (ref 26.0–34.0)
MCHC: 31.7 g/dL — AB (ref 32.0–36.0)
MCV: 76.1 fL — ABNORMAL LOW (ref 80.0–100.0)
MONO ABS: 0.5 10*3/uL (ref 0.2–1.0)
Monocytes Relative: 10 %
Neutro Abs: 3.5 10*3/uL (ref 1.4–6.5)
Neutrophils Relative %: 70 %
PLATELETS: 141 10*3/uL — AB (ref 150–440)
RBC: 5.19 MIL/uL (ref 4.40–5.90)
RDW: 17.8 % — AB (ref 11.5–14.5)
WBC: 5 10*3/uL (ref 3.8–10.6)

## 2016-07-18 LAB — COMPREHENSIVE METABOLIC PANEL
ALT: 22 U/L (ref 17–63)
ANION GAP: 8 (ref 5–15)
AST: 32 U/L (ref 15–41)
Albumin: 3.3 g/dL — ABNORMAL LOW (ref 3.5–5.0)
Alkaline Phosphatase: 87 U/L (ref 38–126)
BUN: 12 mg/dL (ref 6–20)
CHLORIDE: 101 mmol/L (ref 101–111)
CO2: 29 mmol/L (ref 22–32)
CREATININE: 0.77 mg/dL (ref 0.61–1.24)
Calcium: 8.7 mg/dL — ABNORMAL LOW (ref 8.9–10.3)
Glucose, Bld: 110 mg/dL — ABNORMAL HIGH (ref 65–99)
POTASSIUM: 3.8 mmol/L (ref 3.5–5.1)
SODIUM: 138 mmol/L (ref 135–145)
Total Bilirubin: 1.4 mg/dL — ABNORMAL HIGH (ref 0.3–1.2)
Total Protein: 7.4 g/dL (ref 6.5–8.1)

## 2016-07-18 LAB — PROTIME-INR
INR: 2.2
PROTHROMBIN TIME: 24.8 s — AB (ref 11.4–15.2)

## 2016-07-18 LAB — URIC ACID: URIC ACID, SERUM: 8 mg/dL — AB (ref 4.4–7.6)

## 2016-07-18 MED ORDER — COLCHICINE 0.6 MG PO TABS
0.6000 mg | ORAL_TABLET | Freq: Once | ORAL | Status: DC
  Filled 2016-07-18: qty 1

## 2016-07-18 MED ORDER — HYDROCODONE-ACETAMINOPHEN 5-325 MG PO TABS: 1.0000 | ORAL_TABLET | ORAL | 0 refills | Status: DC | PRN

## 2016-07-18 MED ORDER — ACETAMINOPHEN 325 MG PO TABS
650.0000 mg | ORAL_TABLET | Freq: Once | ORAL | Status: AC
  Administered 2016-07-18: 650 mg via ORAL
  Filled 2016-07-18: qty 2

## 2016-07-18 MED ORDER — OXYCODONE-ACETAMINOPHEN 5-325 MG PO TABS
1.0000 | ORAL_TABLET | Freq: Once | ORAL | Status: AC
  Administered 2016-07-18: 1 via ORAL
  Filled 2016-07-18: qty 1

## 2016-07-18 MED ORDER — PREDNISONE 20 MG PO TABS: 40.0000 mg | ORAL_TABLET | Freq: Every day | ORAL | 0 refills | Status: AC

## 2016-07-18 NOTE — ED Triage Notes (Signed)
Pt c/o pain/swelling to right hand since Friday; history of gout but says this does not feel the same; denies injury; pt says pain radiates up arm;

## 2016-07-18 NOTE — Discharge Instructions (Signed)
Take one extra tablet of colchicine one hour after your dose here in the ER.  Follow up with PCP.  Return for fevers, worsening pain.

## 2016-07-18 NOTE — ED Notes (Signed)
Pt reports right hand pain between wrist and distal carpals since Friday, pt denies aggravating diet and hx of gout, pt has speech impediment s/p stroke 3 years ago

## 2016-07-18 NOTE — ED Provider Notes (Signed)
[facility] Emergency Department Provider Note    First MD Initiated Contact with Patient 07/18/16 [phone]     (approximate)  I have reviewed the triage vital signs and the nursing notes.   HISTORY  Chief Complaint Hand Pain    HPI [name] is a 66 y.o. male care of gout presents with acute right wrist and hand pain that started on Friday. States is been gradually worsening. States he's never had a history of gout in his wrist. Denies any fevers. Is on Coumadin for history of lower extremity DVT. Denies any chest pain or shortness of breath. No fevers or chills. No nausea or vomiting. He is on colchicine daily. Denies any trauma. Currently pain is rated as a 10 out of 10 in severity.   Past Medical History:  Diagnosis Date  . Atrial fibrillation ([place])   . Cocaine abuse    + UDS on admission  . Gout current and history of  . History of deep vein thrombosis 2006  . History of tobacco abuse    has smoked for 50 years  . Hypertension   . Pulmonary embolism ([place])   . Stroke [facility])    Family History  Problem Relation Age of Onset  . Aneurysm Mother   . Diabetes Father    Past Surgical History:  Procedure Laterality Date  . VASCULAR SURGERY     Stent in leg   Patient Active Problem List   Diagnosis Date Noted  . 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 Pneumonia 10/12/2014  . CVA (cerebral infarction) 04/26/2011  . Diabetes mellitus 04/26/2011  . 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 Anemia 04/26/2011  . DVT (deep venous thrombosis) ([place]) 04/26/2011      Prior to Admission medications   Medication Sig Start Date End Date Taking? Authorizing Provider  aspirin 81 MG tablet Take 81 mg by mouth daily.    Historical Provider, MD  cephALEXin (KEFLEX) 500 MG capsule Take 1 capsule (500 mg total) by mouth 3 (three) times daily. 06/27/16   Carrie Mew, MD  colchicine 0.6 MG tablet Take 0.6 mg  by mouth daily as needed (for gout).     Historical Provider, MD  dicyclomine (BENTYL) 10 MG capsule Take 1 capsule (10 mg total) by mouth 3 (three) times daily before meals. 05/14/15 05/28/15  Loney Hering, MD  furosemide (LASIX) 40 MG tablet  08/05/15   Historical Provider, MD  FUROSEMIDE PO Take 1 tablet by mouth daily.    Historical Provider, MD  HYDROcodone-acetaminophen (NORCO) 5-325 MG tablet Take 1 tablet by mouth every 4 (four) hours as needed for moderate pain. 07/18/16   Merlyn Lot, MD  lactulose (CHRONULAC) 10 GM/15ML solution Take 30 mLs (20 g total) by mouth daily as needed for mild constipation. 05/11/15   Paulette Blanch, MD  metoprolol tartrate (LOPRESSOR) 25 MG tablet Take 25 mg by mouth 2 (two) times daily.    Historical Provider, MD  oxyCODONE-acetaminophen (ROXICET) 5-325 MG tablet Take 1 tablet by mouth every 4 (four) hours as needed for severe pain. 09/14/15   Paulette Blanch, MD  polyethylene glycol [facility]) packet Take 17 g by mouth daily. 05/14/15   Loney Hering, MD  predniSONE (DELTASONE) 20 MG tablet Take 2 tablets (40 mg total) by mouth daily. 07/18/16 07/21/16  Merlyn Lot, MD  simvastatin (ZOCOR) 20 MG tablet Take 20 mg by mouth daily.    Historical Provider, MD  warfarin (COUMADIN) 2 MG tablet Take 2 mg by mouth daily. Take with the '6mg'$  tablet for a total of '8mg'$ .    Historical Provider, MD  warfarin (COUMADIN) 6 MG tablet Take 6 mg by mouth daily. Every evening at 5 pm. Take with '2mg'$  tablet for a total of '8mg'$ .    Historical Provider, MD    Allergies Patient has no known allergies.    Social History Social History  Substance Use Topics  . Smoking status: Current Every Day Smoker    Packs/day: 0.50    Years: 50.00    Types: Cigarettes  . Smokeless tobacco: Never Used  . Alcohol use No    Review of Systems Patient denies headaches, rhinorrhea, blurry vision, numbness, shortness of breath, chest pain, edema, cough, abdominal pain, nausea, vomiting,  diarrhea, dysuria, fevers, rashes or hallucinations unless otherwise stated above in HPI. ____________________________________________   PHYSICAL EXAM:  VITAL SIGNS: Vitals:   07/18/16 0612  BP: (!) 175/103  Pulse: 86  Resp: (!) 22  Temp: 97.6 F (36.4 C)    Constitutional: Alert and oriented. Uncomfortable but in no acute distress. Eyes: Conjunctivae are normal. PERRL. EOMI. Head: Atraumatic. Nose: No congestion/rhinnorhea. Mouth/Throat: Mucous membranes are moist.  Oropharynx non-erythematous. Neck: No stridor. Painless ROM. No cervical spine tenderness to palpation Hematological/Lymphatic/Immunilogical: No cervical lymphadenopathy. Cardiovascular: Normal rate, regular rhythm. Grossly normal heart sounds.  Good peripheral circulation. Respiratory: Normal respiratory effort.  No retractions. Lungs CTAB. Gastrointestinal: Soft and nontender. No distention. No abdominal bruits. No CVA tenderness. Musculoskeletal: No lower extremity tenderness nor edema.  Pain with light touch of right wrsit, pin with passive ROM,  No effusion, no overlying erythema,  Brisk cap refill.  Pulses 2+ and equal in BUE.  SILT Neurologic:  Normal speech and language. No gross focal neurologic deficits are appreciated. No gait instability. Skin:  Skin is warm, dry and intact. No rash noted. Psychiatric: Mood and affect are normal. Speech and behavior are normal.  ____________________________________________   LABS (all labs ordered are listed, but only abnormal results are displayed)  Results for orders placed or performed during the hospital encounter of 07/18/16 (from the past 24 hour(s))  CBC with Differential/Platelet     Status: Abnormal   Collection Time: 07/18/16  7:22 AM  Result Value Ref Range   WBC 5.0 3.8 - 10.6 K/uL   RBC 5.19 4.40 - 5.90 MIL/uL   Hemoglobin 12.5 (L) 13.0 - 18.0 g/dL   HCT 39.5 (L) 40.0 - 52.0 %   MCV 76.1 (L) 80.0 - 100.0 fL   MCH 24.1 (L) 26.0 - 34.0 pg   MCHC 31.7  (L) 32.0 - 36.0 g/dL   RDW 17.8 (H) 11.5 - 14.5 %   Platelets 141 (L) 150 - 440 K/uL   Neutrophils Relative % 70 %   Neutro Abs 3.5 1.4 - 6.5 K/uL   Lymphocytes Relative 18 %   Lymphs Abs 0.9 (L) 1.0 - 3.6 K/uL   Monocytes Relative 10 %   Monocytes Absolute 0.5 0.2 - 1.0 K/uL   Eosinophils Relative 1 %   Eosinophils Absolute 0.1 0 - 0.7 K/uL   Basophils Relative 1 %   Basophils Absolute 0.0 0 - 0.1 K/uL  Comprehensive metabolic panel     Status: Abnormal   Collection Time: 07/18/16  7:22 AM  Result Value Ref Range   Sodium 138 135 - 145 mmol/L   Potassium 3.8 3.5 - 5.1 mmol/L   Chloride 101 101 - 111 mmol/L  CO2 29 22 - 32 mmol/L   Glucose, Bld 110 (H) 65 - 99 mg/dL   BUN 12 6 - 20 mg/dL   Creatinine, Ser 0.77 0.61 - 1.24 mg/dL   Calcium 8.7 (L) 8.9 - 10.3 mg/dL   Total Protein 7.4 6.5 - 8.1 g/dL   Albumin 3.3 (L) 3.5 - 5.0 g/dL   AST 32 15 - 41 U/L   ALT 22 17 - 63 U/L   Alkaline Phosphatase 87 38 - 126 U/L   Total Bilirubin 1.4 (H) 0.3 - 1.2 mg/dL   GFR calc non Af Amer >60 >60 mL/min   GFR calc Af Amer >60 >60 mL/min   Anion gap 8 5 - 15  Protime-INR     Status: Abnormal   Collection Time: 07/18/16  7:22 AM  Result Value Ref Range   Prothrombin Time 24.8 (H) 11.4 - 15.2 seconds   INR 2.20   Uric acid     Status: Abnormal   Collection Time: 07/18/16  7:22 AM  Result Value Ref Range   Uric Acid, Serum 8.0 (H) 4.4 - 7.6 mg/dL   ____________________________________________  EKG_________________________________  RADIOLOGY  I personally reviewed all radiographic images ordered to evaluate for the above acute complaints and reviewed radiology reports and findings.  These findings were personally discussed with the patient.  Please see medical record for radiology report.  ____________________________________________   PROCEDURES  Procedure(s) performed:  Procedures    Critical Care performed: no ____________________________________________   INITIAL  IMPRESSION / ASSESSMENT AND PLAN / ED COURSE  Pertinent labs & imaging results that were available during my care of the patient were reviewed by me and considered in my medical decision making (see chart for details).  DDX: gout, Oa, Ra, septic arthritis, fracture  Irineo Gaulin is a 66 y.o. who presents to the ED with acute right wrist pain as described above. Patient afebrile and hemodynamically stable. Clinically appears most consistent with gouty arthritis given his history and description of pain. Less consistent with septic arthritis as he does not have a fever, symptoms have been ongoing for 3 days and does not have any warmth or erythema overlying the joint. We'll order x-ray to evaluate for any evidence of subtle fracture. We'll check blood work. We'll provide pain management.  Clinical Course as of Jul 19 822  Sun Jul 18, 2016  0815 Blood work is reassuring and suggestive of acute gout. Patient has been inappropriately taking colchicine only as needed. Patient provided education on appropriate dosing. Will be provided brief steroid burst and PRN pain medication.  Patient was able to tolerate PO and was able to ambulate with a steady gait.  Have discussed with the patient and available family all diagnostics and treatments performed thus far and all questions were answered to the best of my ability. The patient demonstrates understanding and agreement with plan.   [PR]    Clinical Course User Index [PR] Merlyn Lot, MD     ____________________________________________   FINAL CLINICAL IMPRESSION(S) / ED DIAGNOSES  Final diagnoses:  Wrist pain, acute, right  Acute gout of right wrist, unspecified cause      NEW MEDICATIONS STARTED DURING THIS VISIT:  New Prescriptions   HYDROCODONE-ACETAMINOPHEN (NORCO) 5-325 MG TABLET    Take 1 tablet by mouth every 4 (four) hours as needed for moderate pain.   PREDNISONE (DELTASONE) 20 MG TABLET    Take 2 tablets (40 mg total)  by mouth daily.     Note:  This  document was prepared using Systems analyst and may include unintentional dictation errors.    Merlyn Lot, MD 07/18/16 [phone]

## 2016-07-21 ENCOUNTER — Encounter: Payer: Medicare Other | Attending: Internal Medicine | Admitting: Internal Medicine

## 2016-07-21 DIAGNOSIS — L97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: Secondary | ICD-10-CM | POA: Diagnosis not present

## 2016-07-21 DIAGNOSIS — I4891 Unspecified atrial fibrillation: Secondary | ICD-10-CM | POA: Diagnosis not present

## 2016-07-21 DIAGNOSIS — M109 Gout, unspecified: Secondary | ICD-10-CM | POA: Insufficient documentation

## 2016-07-21 DIAGNOSIS — I10 Essential (primary) hypertension: Secondary | ICD-10-CM | POA: Diagnosis not present

## 2016-07-21 DIAGNOSIS — Z7901 Long term (current) use of anticoagulants: Secondary | ICD-10-CM | POA: Insufficient documentation

## 2016-07-21 DIAGNOSIS — I890 Lymphedema, not elsewhere classified: Secondary | ICD-10-CM | POA: Diagnosis not present

## 2016-07-21 DIAGNOSIS — F17210 Nicotine dependence, cigarettes, uncomplicated: Secondary | ICD-10-CM | POA: Diagnosis not present

## 2016-07-21 DIAGNOSIS — Z86718 Personal history of other venous thrombosis and embolism: Secondary | ICD-10-CM | POA: Diagnosis not present

## 2016-07-21 DIAGNOSIS — Z8673 Personal history of transient ischemic attack (TIA), and cerebral infarction without residual deficits: Secondary | ICD-10-CM | POA: Insufficient documentation

## 2016-07-21 DIAGNOSIS — I87331 Chronic venous hypertension (idiopathic) with ulcer and inflammation of right lower extremity: Secondary | ICD-10-CM | POA: Insufficient documentation

## 2016-07-23 ENCOUNTER — Ambulatory Visit (INDEPENDENT_AMBULATORY_CARE_PROVIDER_SITE_OTHER): Payer: Medicare Other | Admitting: Vascular Surgery

## 2016-07-23 ENCOUNTER — Encounter (INDEPENDENT_AMBULATORY_CARE_PROVIDER_SITE_OTHER): Payer: Self-pay | Admitting: Vascular Surgery

## 2016-07-23 ENCOUNTER — Other Ambulatory Visit: Payer: Self-pay | Admitting: Internal Medicine

## 2016-07-23 ENCOUNTER — Ambulatory Visit (INDEPENDENT_AMBULATORY_CARE_PROVIDER_SITE_OTHER): Payer: Medicare Other

## 2016-07-23 VITALS — BP 145/95 | HR 83 | Resp 18 | Ht 75.0 in | Wt 239.0 lb

## 2016-07-23 DIAGNOSIS — L97309 Non-pressure chronic ulcer of unspecified ankle with unspecified severity: Principal | ICD-10-CM

## 2016-07-23 DIAGNOSIS — M7989 Other specified soft tissue disorders: Secondary | ICD-10-CM

## 2016-07-23 DIAGNOSIS — M79606 Pain in leg, unspecified: Secondary | ICD-10-CM | POA: Diagnosis not present

## 2016-07-23 DIAGNOSIS — S81809A Unspecified open wound, unspecified lower leg, initial encounter: Secondary | ICD-10-CM

## 2016-07-23 DIAGNOSIS — I10 Essential (primary) hypertension: Secondary | ICD-10-CM

## 2016-07-23 DIAGNOSIS — I83023 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: Secondary | ICD-10-CM

## 2016-07-23 DIAGNOSIS — E119 Type 2 diabetes mellitus without complications: Secondary | ICD-10-CM | POA: Diagnosis not present

## 2016-07-23 NOTE — Assessment & Plan Note (Signed)
Patient has a non-healing ulceration of the right leg.  He has significant venous disease as seen on duplex today.  He would benefit from laser ablation of the right GSV and SSV to reduce his venous pressure.  This would promote wound healing and help avoid recurrence.  I have discussed the risks and benefits of the procedure.  He voices his understanding and is agreeable to proceed.

## 2016-07-23 NOTE — Patient Instructions (Signed)
           PREOPERATIVE INSTRUCTIONS FOR LASER ABLATION      (Same instructions for Men & Women)  Physician: Leotis Pain  Date:  07/23/2016   1. Shower before your visit, as you will not be permitted to shower after the  procedure for 24 hours.  2. Prior to your appointment, please shave the operative leg as well as 1-2 inches  in the pubic area on that side.  3.  Do not apply any type of moisturizer on your leg on the day of the procedure.  4. Wear loose clothing  5. Be sure you have obtained a pair of prescription compression hose before  arriving to the office. A prescription can be given to you.  6.  Take prescription medicine as directed.  PREOPERATIVE INSTRUCTIONS:  1. Eat lightly.  2. Take prescribed Xanax one 0.'5mg'$  tablet 1 hour prior to appointment.  You will be instructed to take second Xanax 0.'5mg'$  tablet at the time of your appointment  3. Arrange for transportation before and after your procedure.  For questions or concerns contact: Montez Morita  @ [phone] Ext. [phone]
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
## 2016-07-23 NOTE — Progress Notes (Signed)
[name], [name] ([id number]) Visit Report for 07/21/2016 Arrival Information Details Gosling, Date of Service: 07/21/2016 8:15 AM Patient Name: [name] Patient Account Number: [id number] Medical Record Treating RN: Montey Hora [id number] Number: Other Clinician: Date of Birth/Sex: [birth year] (66 y.o. Male) Treating [name] Primary Care Siboney Requejo: Royetta Crochet Vonceil Upshur/Extender: G Referring Chyler Creely: Doristine Locks in Treatment: 5 Visit Information History Since Last Visit Added or deleted any medications: No Patient Arrived: Ambulatory Any new allergies or adverse reactions: No Arrival Time: 08:17 Had a fall or experienced change in No Accompanied By: self activities of daily living that may affect Transfer Assistance: None risk of falls: Patient Identification Verified: Yes Signs or symptoms of abuse/neglect since last No Secondary Verification Process Yes visito Completed: Hospitalized since last visit: No Patient Has Alerts: Yes Has Dressing in Place as Prescribed: Yes Patient Alerts: Patient on Blood Has Compression in Place as Prescribed: Yes Thinner Pain Present Now: Yes warfarin Electronic Signature(s) Signed: 07/21/2016 5:13:29 PM By: Montey Hora Entered By: Montey Hora on 07/21/2016 08:18:14 Gillespie, [name] ([id number]) -------------------------------------------------------------------------------- Encounter Discharge Information Details Stegenga, Date of Service: 07/21/2016 8:15 AM Patient Name: [name] Patient Account Number: [id number] Medical Record Treating RN: Montey Hora [id number] Number: Other Clinician: Date of Birth/Sex: [birth year] (66 y.o. Male) Treating [name], [place] Primary Care Juanmiguel Defelice: Royetta Crochet Snow Peoples/Extender: G Referring Damante Spragg: Doristine Locks in Treatment: 5 Encounter Discharge Information Items Discharge Pain Level: 0 Discharge Condition: Stable Ambulatory Status: Ambulatory Discharge  Destination: Home Transportation: Private Auto Accompanied By: self Schedule Follow-up Appointment: Yes Medication Reconciliation completed and provided to Patient/Care No Shawnelle Spoerl: Provided on Clinical Summary of Care: 07/21/2016 Form Type Recipient Paper Patient WD Electronic Signature(s) Signed: 07/21/2016 9:20:48 AM By: Montey Hora Previous Signature: 07/21/2016 9:07:23 AM Version By: Ruthine Dose Entered By: Montey Hora on 07/21/2016 09:20:48 Sou, [name] -------------------------------------------------------------------------------- Lower Extremity Assessment Details Figgs, Date of Service: 07/21/2016 8:15 AM Patient Name: [name] Patient Account Number: [id number] Medical Record Treating RN: Montey Hora [id number] Number: Other Clinician: Date of Birth/Sex: 07-20-1950 (66 y.o. Male) Treating [name], [place] Primary Care Numair Masden: Royetta Crochet Naythan Douthit/Extender: G Referring Malyk Girouard: Royetta Crochet Weeks in Treatment: 5 Edema Assessment Assessed: [Left: No] [Right: No] E[Left: dema] [Right: :] Calf Left: Right: Point of Measurement: 38 cm From Medial Instep cm 42 cm Ankle Left: Right: Point of Measurement: 12 cm From Medial Instep cm 28.4 cm Vascular Assessment Pulses: Dorsalis Pedis Palpable: [Right:Yes] Posterior Tibial Extremity colors, hair growth, and conditions: Extremity Color: [Right:Hyperpigmented] Hair Growth on Extremity: [Right:No] Temperature of Extremity: [Right:Warm] Capillary Refill: [Right:< 3 seconds] Electronic Signature(s) Signed: 07/21/2016 5:13:29 PM By: Montey Hora Entered By: Montey Hora on 07/21/2016 08:26:24 Kasparian, [name] ([id number]) -------------------------------------------------------------------------------- Multi Wound Chart Details Donze, Date of Service: 07/21/2016 8:15 AM Patient Name: [name] Patient Account Number: [id number] Medical Record Treating RN: Montey Hora [id number] Number: Other Clinician: Date of Birth/Sex: [birth year] (65 y.o. Male) Treating [name], MICHAEL Primary Care Talton Delpriore: Royetta Crochet Anjanae Woehrle/Extender: G Referring Kumiko Fishman: Royetta Crochet Weeks in Treatment: 5 Vital Signs Height(in): 74 Pulse(bpm): 61 Weight(lbs): 252 Blood Pressure 133/85 (mmHg): Body Mass Index(BMI): 32 Temperature(F): 97.5 Respiratory Rate 18 (breaths/min): Photos: [N/A:N/A] Wound Location: Right Lower Leg - Medial N/A N/A Wounding Event: Gradually Appeared N/A N/A Primary Etiology: Venous Leg Ulcer N/A N/A Comorbid History: Arrhythmia, Hypertension, N/A N/A Gout, Neuropathy Date Acquired: 06/02/2016 N/A N/A Weeks of Treatment: 5 N/A N/A Wound Status: Open N/A N/A Measurements L x W x D 2.3x1.5x0.2 N/A N/A (cm) Area (cm) : 2.71 N/A N/A Volume (cm) : 0.542  N/A N/A % Reduction in Area: -4.60% N/A N/A % Reduction in Volume: -109.30% N/A N/A Classification: Partial Thickness N/A N/A Exudate Amount: Large N/A N/A Exudate Type: Serous N/A N/A Exudate Color: amber N/A N/A Wound Margin: Flat and Intact N/A N/A Granulation Amount: Large (67-100%) N/A N/A Granulation Quality: Red, Friable N/A N/A Craigo, Nishant ([id number]) Necrotic Amount: Small (1-33%) N/A N/A Exposed Structures: Fat Layer (Subcutaneous N/A N/A Tissue) Exposed: Yes Fascia: No Tendon: No Muscle: No Joint: No Bone: No Epithelialization: Small (1-33%) N/A N/A Periwound Skin Texture: Scarring: Yes N/A N/A Excoriation: No Induration: No Callus: No Crepitus: No Rash: No Periwound Skin Maceration: No N/A N/A Moisture: Dry/Scaly: No Periwound Skin Color: Hemosiderin Staining: Yes N/A N/A Atrophie Blanche: No Cyanosis: No Ecchymosis: No Erythema: No Mottled: No Pallor: No Rubor: No Temperature: No Abnormality N/A N/A Tenderness on Yes N/A N/A Palpation: Wound Preparation: Ulcer Cleansing: N/A N/A Rinsed/Irrigated with Saline, Other: soap  and water Topical Anesthetic Applied: Other: lidocaine 4% Procedures Performed: Cellular or Tissue Based N/A N/A Product Treatment Notes Electronic Signature(s) Signed: 07/22/2016 5:01:50 PM By: [name] MD Entered By: [name] on 07/21/2016 08:57:28 Hupp, [name] ([id number]) -------------------------------------------------------------------------------- Multi-Disciplinary Care Plan Details Dyess, Date of Service: 07/21/2016 8:15 AM Patient Name: [name] Patient Account Number: [id number] Medical Record Treating RN: Montey Hora [id number] Number: Other Clinician: Date of Birth/Sex: 22-Aug-1950 (66 y.o. Male) Treating [name], [place] Primary Care Rumaysa Sabatino: Royetta Crochet Daimien Patmon/Extender: G Referring Malacai Grantz: Doristine Locks in Treatment: 5 Active Inactive ` Orientation to the Wound Care Program Nursing Diagnoses: Knowledge deficit related to the wound healing center program Goals: Patient/caregiver will verbalize understanding of the [place] Program Date Initiated: 06/16/2016 Target Resolution Date: 08/15/2016 Goal Status: Active Interventions: Provide education on orientation to the wound center Notes: ` Venous Leg Ulcer Nursing Diagnoses: Potential for venous Insuffiency (use before diagnosis confirmed) Goals: Patient will maintain optimal edema control Date Initiated: 06/16/2016 Target Resolution Date: 08/19/2016 Goal Status: Active Interventions: Compression as ordered Notes: ` Wound/Skin Impairment Nursing Diagnoses: CAROLYN, MANISCALCO ([id number]) Impaired tissue integrity Goals: Patient/caregiver will verbalize understanding of skin care regimen Date Initiated: 06/16/2016 Target Resolution Date: 07/15/2016 Goal Status: Active Ulcer/skin breakdown will have a volume reduction of 30% by week 4 Date Initiated: 06/16/2016 Target Resolution Date: 07/15/2016 Goal Status: Active Ulcer/skin breakdown will have a volume  reduction of 50% by week 8 Date Initiated: 06/16/2016 Target Resolution Date: 07/15/2016 Goal Status: Active Ulcer/skin breakdown will have a volume reduction of 80% by week 12 Date Initiated: 06/16/2016 Target Resolution Date: 07/15/2016 Goal Status: Active Ulcer/skin breakdown will heal within 14 weeks Date Initiated: 06/16/2016 Target Resolution Date: 07/15/2016 Goal Status: Active Interventions: Assess patient/caregiver ability to obtain necessary supplies Assess patient/caregiver ability to perform ulcer/skin care regimen upon admission and as needed Assess ulceration(s) every visit Notes: Electronic Signature(s) Signed: 07/21/2016 5:13:29 PM By: Montey Hora Entered By: Montey Hora on 07/21/2016 08:32:14 Urey, [name] ([id number]) -------------------------------------------------------------------------------- Pain Assessment Details Miranda, Date of Service: 07/21/2016 8:15 AM Patient Name: [name] Patient Account Number: [id number] Medical Record Treating RN: Montey Hora [id number] Number: Other Clinician: Date of Birth/Sex: Sep 14, 1950 (66 y.o. Male) Treating [name] Primary Care Astra Gregg: Royetta Crochet Allyah Heather/Extender: G Referring Terris Bodin: Doristine Locks in Treatment: 5 Active Problems Location of Pain Severity and Description of Pain Patient Has Paino Yes Site Locations Pain Location: Pain in Ulcers With Dressing Change: Yes Duration of the Pain. Constant / Intermittento Constant Pain Management and Medication Current Pain Management: Notes Topical or injectable lidocaine is offered  to patient for acute pain when surgical debridement is performed. If needed, Patient is instructed to use over the counter pain medication for the following 24-48 hours after debridement. Wound care MDs do not prescribed pain medications. Patient has chronic pain or uncontrolled pain. Patient has been instructed to make an appointment with their Primary Care  Physician for pain management. Electronic Signature(s) Signed: 07/21/2016 5:13:29 PM By: Montey Hora Entered By: Montey Hora on 07/21/2016 08:18:27 [place]field, [name] ([id number]) -------------------------------------------------------------------------------- Patient/Caregiver Education Details Lacount, Date of Service: 07/21/2016 8:15 AM Patient Name: [name] Patient Account Number: [id number] Medical Record Treating RN: Montey Hora [id number] Number: Other Clinician: Date of Birth/Gender: 1950-06-15 (66 y.o. Male) Treating [name], MICHAEL Primary Care Physician/Extender: Tonye Pearson Physician: Weeks in Treatment: 5 Referring Physician: Royetta Crochet Education Assessment Education Provided To: Patient Education Topics Provided Venous: Handouts: Other: rewrap if needed Methods: Explain/Verbal Responses: State content correctly Electronic Signature(s) Signed: 07/21/2016 5:13:29 PM By: Montey Hora Entered By: Montey Hora on 07/21/2016 09:21:04 Castronova, [name] ([id number]) -------------------------------------------------------------------------------- Wound Assessment Details Corella, Date of Service: 07/21/2016 8:15 AM Patient Name: [name] Patient Account Number: [id number] Medical Record Treating RN: Montey Hora [id number] Number: Other Clinician: Date of Birth/Sex: 08/18/1950 (66 y.o. Male) Treating [name], MICHAEL Primary Care Ziair Penson: Royetta Crochet Shakeema Lippman/Extender: G Referring Tsutomu Barfoot: Doristine Locks in Treatment: 5 Wound Status Wound Number: 4 Primary Venous Leg Ulcer Etiology: Wound Location: Right Lower Leg - Medial Wound Status: Open Wounding Event: Gradually Appeared Comorbid Arrhythmia, Hypertension, Gout, Date Acquired: 06/02/2016 History: Neuropathy Weeks Of Treatment: 5 Clustered Wound: No Photos Wound Measurements Length: (cm) 2.3 Width: (cm) 1.5 Depth: (cm) 0.2 Area: (cm) 2.71 Volume: (cm) 0.542 %  Reduction in Area: -4.6% % Reduction in Volume: -109.3% Epithelialization: Small (1-33%) Tunneling: No Undermining: No Wound Description Classification: Partial Thickness Wound Margin: Flat and Intact Exudate Amount: Large Exudate Type: Serous Exudate Color: amber Foul Odor After Cleansing: No Slough/Fibrino Yes Wound Bed Granulation Amount: Large (67-100%) Exposed Structure Granulation Quality: Red, Friable Fascia Exposed: No Necrotic Amount: Small (1-33%) Fat Layer (Subcutaneous Tissue) Exposed: Yes Hosick, Ledon ([id number]) Necrotic Quality: Adherent Slough Tendon Exposed: No Muscle Exposed: No Joint Exposed: No Bone Exposed: No Periwound Skin Texture Texture Color No Abnormalities Noted: No No Abnormalities Noted: No Callus: No Atrophie Blanche: No Crepitus: No Cyanosis: No Excoriation: No Ecchymosis: No Induration: No Erythema: No Rash: No Hemosiderin Staining: Yes Scarring: Yes Mottled: No Pallor: No Moisture Rubor: No No Abnormalities Noted: No Dry / Scaly: No Temperature / Pain Maceration: No Temperature: No Abnormality Tenderness on Palpation: Yes Wound Preparation Ulcer Cleansing: Rinsed/Irrigated with Saline, Other: soap and water, Topical Anesthetic Applied: Other: lidocaine 4%, Treatment Notes Wound #4 (Right, Medial Lower Leg) 1. Cleansed with: Cleanse wound with antibacterial soap and water 2. Anesthetic Topical Lidocaine 4% cream to wound bed prior to debridement 3. Peri-wound Care: Skin Prep 4. Dressing Applied: Mepitel Other dressing (specify in notes) 5. Secondary Dressing Applied ABD Pad Dry Gauze 7. Secured with 3 Layer Compression System - Right Lower Extremity Notes theraskin placed by Dr Dellia Nims today, Louretta Parma to anchor Electronic Signature(s) Signed: 07/21/2016 5:13:29 PM By: Montey Hora Entered By: Montey Hora on 07/21/2016 08:31:56 Bence, [name] ([id number]) Paxton, [name]  ([id number]) -------------------------------------------------------------------------------- Vitals Details Dattilo, Date of Service: 07/21/2016 8:15 AM Patient Name: [name] Patient Account Number: [id number] Medical Record Treating RN: Montey Hora [id number] Number: Other Clinician: Date of Birth/Sex: 05/11/1951 (66 y.o. Male) Treating Dellia Nims, [place] Primary Care Stanislaw Acton: Royetta Crochet [place] Blizzard/Extender: G Referring Felice Deem: Doristine Locks in Treatment:  5 Vital Signs Time Taken: 08:19 Temperature (F): 97.5 Height (in): 74 Pulse (bpm): 61 Weight (lbs): 252 Respiratory Rate (breaths/min): 18 Body Mass Index (BMI): 32.4 Blood Pressure (mmHg): 133/85 Reference Range: 80 - 120 mg / dl Electronic Signature(s) Signed: 07/21/2016 5:13:29 PM By: Montey Hora Entered By: Montey Hora on 07/21/2016 08:19:56

## 2016-07-23 NOTE — Assessment & Plan Note (Signed)
blood pressure control important in reducing the progression of atherosclerotic disease. On appropriate oral medications.  

## 2016-07-23 NOTE — Progress Notes (Signed)
Patient ID: [name], male   DOB: 01-03-1951, 66 y.o.   MRN: [id number]  Chief Complaint  Patient presents with  . Re-evaluation    Venous reflux    HPI [name] is a 66 y.o. male.  I am asked to see the patient by Dr. Dellia Nims at the [facility] for evaluation of venous insufficiency.  The patient reports Nonhealing ulcerations on the right calf. These are now been present for 2-3 months. Despite optimal local wound care from the wound care center, the wounds really have not improved that much. He has a known previous history of DVT on that leg. He reports previous ulceration on the left leg which healed reasonably quickly. He has no fevers or chills. There was no trauma, injury, or inciting event that started the symptoms. Compression has lowered the swelling in that leg somewhat, but the wound persists. 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     Past Medical History:  Diagnosis Date  . Atrial fibrillation ([place])   . Cocaine abuse    + UDS on admission  . Gout current and history of  . History of deep vein thrombosis 2006  . History of tobacco abuse    has smoked for 50 years  . Hypertension   . Pulmonary embolism ([place])   . Stroke [facility])     Past Surgical History:  Procedure Laterality Date  . VASCULAR SURGERY     Stent in leg    Family History  Problem Relation Age of Onset  . Aneurysm Mother   . Diabetes Father   No bleeding disorders, clotting disorders, or autoimmune diseases  Social History Social History  Substance Use Topics  . Smoking status: Current Every Day Smoker    Packs/day: 0.50    Years: 50.00    Types: Cigarettes  . Smokeless tobacco: Never Used  . Alcohol use No  Previous cocaine use present.  No Known Allergies  Current Outpatient Prescriptions  Medication Sig Dispense Refill  . aspirin 81 MG tablet Take 81 mg by mouth daily.    .  cephALEXin (KEFLEX) 500 MG capsule Take 1 capsule (500 mg total) by mouth 3 (three) times daily. 21 capsule 0  . colchicine 0.6 MG tablet Take 0.6 mg by mouth daily as needed (for gout).     [name] dicyclomine (BENTYL) 10 MG capsule Take 1 capsule (10 mg total) by mouth 3 (three) times daily before meals. 20 capsule 0  . furosemide (LASIX) 40 MG tablet     . FUROSEMIDE PO Take 1 tablet by mouth daily.    [name] HYDROcodone-acetaminophen (NORCO) 5-325 MG tablet Take 1 tablet by mouth every 4 (four) hours as needed for moderate pain. 3 tablet 0  . lactulose (CHRONULAC) 10 GM/15ML solution Take 30 mLs (20 g total) by mouth daily as needed for mild constipation. 120 mL 0  . metoprolol tartrate (LOPRESSOR) 25 MG tablet Take 25 mg by mouth 2 (two) times daily.    [name] oxyCODONE-acetaminophen (ROXICET) 5-325 MG tablet Take 1 tablet by mouth every 4 (four) hours as needed for severe pain. 15 tablet 0  . polyethylene glycol (MIRALAX) packet Take 17 g by mouth daily. 14 each 0  . simvastatin (ZOCOR) 20 MG tablet Take 20 mg by mouth daily.    [name] warfarin (COUMADIN) 2 MG tablet Take 2 mg by mouth daily. Take with the 36m  tablet for a total of 46m.    . warfarin (COUMADIN) 6 MG tablet Take 6 mg by mouth daily. Every evening at 5 pm. Take with 273mtablet for a total of 75m54m    No current facility-administered medications for this visit.       REVIEW OF SYSTEMS (Negative unless checked)  Constitutional: [] Weight loss  [] Fever  [] Chills Cardiac: [] Chest pain   [] Chest pressure   [] Palpitations   [] Shortness of breath when laying flat   [] Shortness of breath at rest   [x] Shortness of breath with exertion. Vascular:  [] Pain in legs with walking   [] Pain in legs at rest   [] Pain in legs when laying flat   [] Claudication   [] Pain in feet when walking  [] Pain in feet at rest  [] Pain in feet when laying flat   [x] History of DVT   [] Phlebitis   [x] Swelling in legs   [x] Varicose veins   [x] Non-healing ulcers Pulmonary:   [] Uses  home oxygen   [] Productive cough   [] Hemoptysis   [] Wheeze  [] COPD   [] Asthma Neurologic:  [] Dizziness  [] Blackouts   [] Seizures   [] History of stroke   [] History of TIA  [] Aphasia   [] Temporary blindness   [] Dysphagia   [] Weakness or numbness in arms   [] Weakness or numbness in legs Musculoskeletal:  [] Arthritis   [] Joint swelling   [] Joint pain   [] Low back pain Hematologic:  [] Easy bruising  [] Easy bleeding   [] Hypercoagulable state   [] Anemic  [] Hepatitis Gastrointestinal:  [] Blood in stool   [] Vomiting blood  [] Gastroesophageal reflux/heartburn   [] Abdominal pain Genitourinary:  [] Chronic kidney disease   [] Difficult urination  [] Frequent urination  [] Burning with urination   [] Hematuria Skin:  [] Rashes   [x] Ulcers   [x] Wounds Psychological:  [] History of anxiety   []  History of major depression.    Physical Exam BP (!) 145/95 (BP Location: Right Arm)   Pulse 83   Resp 18   Ht 6' 3"  (1.905 m)   Wt 239 lb (108.4 kg)   BMI 29.87 kg/m  Gen:  WD/WN, NAD Head: [place]/AT, No temporalis wasting.  Ear/Nose/Throat: Hearing grossly intact, nares w/o erythema or drainage, oropharynx w/o Erythema/Exudate Eyes: Conjunctiva clear, sclera non-icteric  Neck: trachea midline.  No JVD.  Pulmonary:  Good air movement, no use of accessory muscles, .  Cardiac: RRR, normal S1, S2. Vascular:  Vessel Right Left  Radial Palpable Palpable  Ulnar Palpable Palpable  Brachial Palpable Palpable  Carotid Palpable, without bruit Palpable, without bruit  Aorta Not palpable N/A  Femoral Palpable Palpable  Popliteal Palpable Palpable  PT Trace Palpable 1+ Palpable  DP Palpable Palpable   Gastrointestinal: soft, non-tender/non-distended. No guarding/reflex. No masses, surgical incisions, or scars. Musculoskeletal: M/S 5/5 throughout.  Extremities without ischemic changes.  No deformity or atrophy. 2-3+ RLE edema, 1+ LLE edema. Neurologic: Sensation grossly intact in extremities.  Symmetrical.  Speech is fluent.  Motor exam as listed above. Psychiatric: Judgment intact, Mood & affect appropriate for pt's clinical situation. Dermatologic: wound on the right calf dressed today. Lymph : No Cervical, Axillary, or Inguinal lymphadenopathy.   Radiology Us Koreanous Img Lower Unilateral Right  Result Date: 06/27/2016 CLINICAL DATA:  Right leg pain, swelling, redness x2 days EXAM: RIGHT LOWER EXTREMITY VENOUS DOPPLER ULTRASOUND TECHNIQUE: Gray-scale sonography with graded compression, as well as color Doppler and duplex ultrasound were performed to evaluate the lower extremity deep venous systems from the level of the common femoral vein and including the common femoral,  femoral, profunda femoral, popliteal and calf veins including the posterior tibial, peroneal and gastrocnemius veins when visible. The superficial great saphenous vein was also interrogated. Spectral Doppler was utilized to evaluate flow at rest and with distal augmentation maneuvers in the common femoral, femoral and popliteal veins. COMPARISON:  None. FINDINGS: Contralateral Common Femoral Vein: Respiratory phasicity is normal and symmetric with the symptomatic side. No evidence of thrombus. Normal compressibility. Common Femoral Vein: Subocclusive thrombus. Saphenofemoral Junction: Subocclusive thrombus. Profunda Femoral Vein: Subocclusive thrombus. Femoral Vein: Subocclusive thrombus. Popliteal Vein: Subocclusive thrombus. Calf Veins: No evidence of thrombus. Normal compressibility and flow on color Doppler imaging. Superficial Great Saphenous Vein: No evidence of thrombus. Normal compressibility and flow on color Doppler imaging. Venous Reflux:  None. Other Findings:  None. IMPRESSION: Subocclusive deep venous thrombosis extending from the right popliteal vein to the common femoral vein. Electronically Signed   By: Julian Hy M.D.   On: 06/27/2016 16:35   Dg Hand Complete Right  Result Date: 07/18/2016 CLINICAL DATA:  Right wrist/ hand pain.  Concern for fracture or dislocation. History of gout. EXAM: RIGHT HAND - COMPLETE 3+ VIEW COMPARISON:  None. FINDINGS: There is a 4 mm ossicle projecting over the ulnocarpal joint space adjacent to the ulnar aspect of the radius which appears relatively well corticated. No clear fracture donor site is identified. There is osseous spurring of the distal radius with mild radiocarpal joint space narrowing. Mild degenerative spurring and joint space narrowing are noted involving multiple interphalangeal joints. There is no dislocation. There is smooth, benign appearing periosteal bone formation along the ulnar aspect of the shaft of the proximal phalanx of the long finger. There is soft tissue swelling about the wrist. IMPRESSION: Small ossicle at the ulnocarpal joint space level. This may be degenerative or reflective of an age indeterminate fracture, however no clear donor site is identified. Soft tissue swelling about the wrist. Electronically Signed   By: Logan Bores M.D.   On: 07/18/2016 08:28    Labs Recent Results (from the past 2160 hour(s))  CBC with Differential     Status: Abnormal   Collection Time: 06/27/16 11:24 AM  Result Value Ref Range   WBC 3.6 (L) 3.8 - 10.6 K/uL   RBC 4.85 4.40 - 5.90 MIL/uL   Hemoglobin 11.9 (L) 13.0 - 18.0 g/dL   HCT 36.6 (L) 40.0 - 52.0 %   MCV 75.5 (L) 80.0 - 100.0 fL   MCH 24.5 (L) 26.0 - 34.0 pg   MCHC 32.5 32.0 - 36.0 g/dL   RDW 18.4 (H) 11.5 - 14.5 %   Platelets 147 (L) 150 - 440 K/uL   Neutrophils Relative % 56 %   Neutro Abs 2.0 1.4 - 6.5 K/uL   Lymphocytes Relative 31 %   Lymphs Abs 1.1 1.0 - 3.6 K/uL   Monocytes Relative 10 %   Monocytes Absolute 0.4 0.2 - 1.0 K/uL   Eosinophils Relative 3 %   Eosinophils Absolute 0.1 0 - 0.7 K/uL   Basophils Relative 0 %   Basophils Absolute 0.0 0 - 0.1 K/uL  Comprehensive metabolic panel     Status: Abnormal   Collection Time: 06/27/16 11:24 AM  Result Value Ref Range   Sodium 141 135 - 145 mmol/L    Potassium 3.8 3.5 - 5.1 mmol/L   Chloride 107 101 - 111 mmol/L   CO2 29 22 - 32 mmol/L   Glucose, Bld 110 (H) 65 - 99 mg/dL   BUN 18 6 - 20 mg/dL  Creatinine, Ser 0.90 0.61 - 1.24 mg/dL   Calcium 8.7 (L) 8.9 - 10.3 mg/dL   Total Protein 6.9 6.5 - 8.1 g/dL   Albumin 3.4 (L) 3.5 - 5.0 g/dL   AST 37 15 - 41 U/L   ALT 27 17 - 63 U/L   Alkaline Phosphatase 84 38 - 126 U/L   Total Bilirubin 0.9 0.3 - 1.2 mg/dL   GFR calc non Af Amer >60 >60 mL/min   GFR calc Af Amer >60 >60 mL/min    Comment: (NOTE) The eGFR has been calculated using the CKD EPI equation. This calculation has not been validated in all clinical situations. eGFR's persistently <60 mL/min signify possible Chronic Kidney Disease.    Anion gap 5 5 - 15  Protime-INR     Status: Abnormal   Collection Time: 06/27/16  4:43 PM  Result Value Ref Range   Prothrombin Time 28.0 (H) 11.4 - 15.2 seconds   INR 2.56   VAS [place] LOWER EXTREMITY ARTERIAL DUPLEX     Status: None   Collection Time: 07/05/16  8:47 AM  Result Value Ref Range   Right peroneal sys min -9 m/s   Right super femoral prox sys PSV -138 cm/s   Right super femoral mid sys PSV -107 cm/s   Right super femoral dist sys PSV -77 cm/s   Right popliteal prox sys PSV 81 cm/s   Right popliteal dist sys PSV -122 cm/s   Right peroneal sys PSV -61 cm/s   RIGHT SUPER FEMORAL PROX EDV 0 cm/sec   RIGHT SUPER FEMORAL MID EDV -4 cm/sec   RIGHT SUPER FEMORAL DIST EDV -7 cm/sec   RIGHT POPLITEAL PROX EDV 9 cm/sec   RIGHT POPLITEAL DIST EDV -5 cm/sec  CBC with Differential/Platelet     Status: Abnormal   Collection Time: 07/18/16  7:22 AM  Result Value Ref Range   WBC 5.0 3.8 - 10.6 K/uL   RBC 5.19 4.40 - 5.90 MIL/uL   Hemoglobin 12.5 (L) 13.0 - 18.0 g/dL   HCT 39.5 (L) 40.0 - 52.0 %   MCV 76.1 (L) 80.0 - 100.0 fL   MCH 24.1 (L) 26.0 - 34.0 pg   MCHC 31.7 (L) 32.0 - 36.0 g/dL   RDW 17.8 (H) 11.5 - 14.5 %   Platelets 141 (L) 150 - 440 K/uL   Neutrophils Relative % 70 %    Neutro Abs 3.5 1.4 - 6.5 K/uL   Lymphocytes Relative 18 %   Lymphs Abs 0.9 (L) 1.0 - 3.6 K/uL   Monocytes Relative 10 %   Monocytes Absolute 0.5 0.2 - 1.0 K/uL   Eosinophils Relative 1 %   Eosinophils Absolute 0.1 0 - 0.7 K/uL   Basophils Relative 1 %   Basophils Absolute 0.0 0 - 0.1 K/uL  Comprehensive metabolic panel     Status: Abnormal   Collection Time: 07/18/16  7:22 AM  Result Value Ref Range   Sodium 138 135 - 145 mmol/L   Potassium 3.8 3.5 - 5.1 mmol/L   Chloride 101 101 - 111 mmol/L   CO2 29 22 - 32 mmol/L   Glucose, Bld 110 (H) 65 - 99 mg/dL   BUN 12 6 - 20 mg/dL   Creatinine, Ser 0.77 0.61 - 1.24 mg/dL   Calcium 8.7 (L) 8.9 - 10.3 mg/dL   Total Protein 7.4 6.5 - 8.1 g/dL   Albumin 3.3 (L) 3.5 - 5.0 g/dL   AST 32 15 - 41 U/L   ALT 22  17 - 63 U/L   Alkaline Phosphatase 87 38 - 126 U/L   Total Bilirubin 1.4 (H) 0.3 - 1.2 mg/dL   GFR calc non Af Amer >60 >60 mL/min   GFR calc Af Amer >60 >60 mL/min    Comment: (NOTE) The eGFR has been calculated using the CKD EPI equation. This calculation has not been validated in all clinical situations. eGFR's persistently <60 mL/min signify possible Chronic Kidney Disease.    Anion gap 8 5 - 15  Protime-INR     Status: Abnormal   Collection Time: 07/18/16  7:22 AM  Result Value Ref Range   Prothrombin Time 24.8 (H) 11.4 - 15.2 seconds   INR 2.20   Uric acid     Status: Abnormal   Collection Time: 07/18/16  7:22 AM  Result Value Ref Range   Uric Acid, Serum 8.0 (H) 4.4 - 7.6 mg/dL    Assessment/Plan:  HTN (hypertension), malignant blood pressure control important in reducing the progression of atherosclerotic disease. On appropriate oral medications.   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  He has significant venous disease as seen on duplex today.  He would benefit from laser ablation of the right GSV and SSV to reduce his venous  pressure.  This would promote wound healing and help avoid recurrence.  I have discussed the risks and benefits of the procedure.  He voices his understanding and is agreeable to proceed.        Leotis Pain 07/23/2016, 2:45 PM   This note was created with Dragon medical transcription system.  Any errors from dictation are unintentional.

## 2016-07-28 DIAGNOSIS — I87331 Chronic venous hypertension (idiopathic) with ulcer and inflammation of right lower extremity: Secondary | ICD-10-CM | POA: Diagnosis not present

## 2016-07-28 NOTE — Progress Notes (Signed)
[name] ([id number]) Visit Report for 07/28/2016 Arrival Information Details Patient Name: [name], [name] Date of Service: 07/28/2016 8:15 AM Medical Record Number: [id number] Patient Account Number: [id number] Date of Birth/Sex: 1950/12/11 (66 y.o. Male) Treating RN: Cornell Barman Primary Care Darryl Willner: Royetta Crochet Other Clinician: Referring Weslee Fogg: Royetta Crochet Treating Jjesus Dingley/Extender: Suella Grove in Treatment: 6 Visit Information History Since Last Visit Added or deleted any medications: No Patient Arrived: Ambulatory Any new allergies or adverse reactions: No Arrival Time: 08:07 Had a fall or experienced change in No Accompanied By: self activities of daily living that may affect Transfer Assistance: None risk of falls: Patient Identification Verified: Yes Signs or symptoms of abuse/neglect since last No Secondary Verification Process Yes visito Completed: Hospitalized since last visit: No Patient Requires Transmission- No Has Dressing in Place as Prescribed: Yes Based Precautions: Has Compression in Place as Prescribed: Yes Patient Has Alerts: Yes Pain Present Now: Yes Patient Alerts: Patient on Blood Thinner warfarin Electronic Signature(s) Signed: 07/28/2016 1:25:50 PM By: Gretta Cool, RN, [name], [name], [name] Entered By: Gretta Cool, RN, [name], Kim on 07/28/2016 08:23:06 [place]iff, [name] ([id number]) -------------------------------------------------------------------------------- Compression Therapy Details Patient Name: [name] Date of Service: 07/28/2016 8:15 AM Medical Record Number: [id number] Patient Account Number: [id number] Date of Birth/Sex: 19-Apr-1951 (66 y.o. Male) Treating RN: Cornell Barman Primary Care Gardy Montanari: Royetta Crochet Other Clinician: Referring Ladye Macnaughton: Royetta Crochet Treating Menucha Dicesare/Extender: Weeks in Treatment: 6 Compression Therapy Performed for Wound Wound #4 Right,Medial Lower Leg Assessment: Performed By: Clinician Cornell Barman, RN Compression Type: Three Layer Pre Treatment ABI: 1 Electronic Signature(s) Signed: 07/28/2016 1:25:50 PM By: Gretta Cool, RN, [name], [name], [name] Entered By: Gretta Cool, RN, [name], Kim on 07/28/2016 08:23:16 Naron, [name] ([id number]) -------------------------------------------------------------------------------- Encounter Discharge Information Details Patient Name: [name] Date of Service: 07/28/2016 8:15 AM Medical Record Number: [id number] Patient Account Number: [id number] Date of Birth/Sex: March 15, 1951 (66 y.o. Male) Treating RN: Cornell Barman Primary Care Archana Eckman: Royetta Crochet Other Clinician: Referring Kadeidra Coryell: Royetta Crochet Treating Davy Westmoreland/Extender: Weeks in Treatment: 6 Encounter Discharge Information Items Discharge Pain Level: 0 Discharge Condition: Stable Ambulatory Status: Ambulatory Discharge Destination: Home Private Transportation: Auto Accompanied By: self Schedule Follow-up Appointment: Yes Medication Reconciliation completed and Yes provided to Patient/Care Latika Kronick: Clinical Summary of Care: Electronic Signature(s) Signed: 07/28/2016 1:25:50 PM By: Gretta Cool, RN, [name], [name], [name] Entered By: Gretta Cool, RN, [name], Kim on 07/28/2016 08:23:50 [place], [name] ([id number]) -------------------------------------------------------------------------------- Pain Assessment Details Witzke, Date of Service: 07/28/2016 8:15 AM Patient Name: [name] Patient Account Number: [id number] Medical Record Treating RN: Cornell Barman [id number] Number: Other Clinician: Date of Birth/Sex: July 09, 1950 (66 y.o. Male) Treating [name], MICHAEL Primary Care Erinn Mendosa: Royetta Crochet Johara Lodwick/Extender: G Referring Scottlynn Lindell: Doristine Locks in Treatment: 6 Active Problems Location of Pain Severity and Description of Pain Patient Has Paino Yes Site Locations Pain Location: Pain in Ulcers With Dressing Change: No Rate the pain. Current Pain Level: 3 Pain Management and  Medication Current Pain Management: Notes Topical or injectable lidocaine is offered to patient for acute pain when surgical debridement is performed. If needed, Patient is instructed to use over the counter pain medication for the following 24-48 hours after debridement. Wound care MDs do not prescribed pain medications. Patient has chronic pain or uncontrolled pain. Patient has been instructed to make an appointment with their Primary Care Physician for pain management. Electronic Signature(s) Signed: 07/28/2016 1:25:50 PM By: Gretta Cool, RN, [name], [name], [name] Entered By: Gretta Cool, RN, [name], Kim on 07/28/2016 08:08:19 [name] ([id number]) -------------------------------------------------------------------------------- Patient/Caregiver Education Details Patient Name: [name] Date of  Service: 07/28/2016 8:15 AM Medical Record Number: [id number] Patient Account Number: [id number] Date of Birth/Gender: 12-May-1951 (66 y.o. Male) Treating RN: Cornell Barman Primary Care Physician: Royetta Crochet Other Clinician: Referring Physician: Royetta Crochet Treating Physician/Extender: Suella Grove in Treatment: 6 Education Assessment Education Provided To: Patient Education Topics Provided Venous: Handouts: Controlling Swelling with Multilayered Compression Wraps Methods: Demonstration, Explain/Verbal Responses: State content correctly Electronic Signature(s) Signed: 07/28/2016 1:25:50 PM By: Gretta Cool, RN, [name], [name], [name] Entered By: Gretta Cool, RN, [name], Kim on 07/28/2016 08:23:45 Tipping, [name] ([id number]) -------------------------------------------------------------------------------- Wound Assessment Details Oregel, Date of Service: 07/28/2016 8:15 AM Patient Name: [name] Patient Account Number: [id number] Medical Record Treating RN: Cornell Barman [id number] Number: Other Clinician: Date of Birth/Sex: June 11, 1950 (66 y.o. Male) Treating [name], MICHAEL Primary Care Rim Thatch: Royetta Crochet Faten Frieson/Extender: G Referring Yonathan Perrow: Doristine Locks in Treatment: 6 Wound Status Wound Number: 4 Primary Venous Leg Ulcer Etiology: Wound Location: Right Lower Leg - Medial Wound Status: Open Wounding Event: Gradually Appeared Comorbid Arrhythmia, Hypertension, Gout, Date Acquired: 06/02/2016 History: Neuropathy Weeks Of Treatment: 6 Clustered Wound: No Wound Measurements Length: (cm) 2.3 Width: (cm) 1.5 Depth: (cm) 0.2 Area: (cm) 2.71 Volume: (cm) 0.542 % Reduction in Area: -4.6% % Reduction in Volume: -109.3% Epithelialization: Small (1-33%) Tunneling: No Undermining: No Wound Description Classification: Partial Thickness Wound Margin: Flat and Intact Exudate Amount: Large Exudate Type: Serous Exudate Color: amber Foul Odor After Cleansing: No Slough/Fibrino Yes Wound Bed Granulation Amount: Large (67-100%) Exposed Structure Granulation Quality: Red, Friable Fascia Exposed: No Necrotic Amount: Small (1-33%) Fat Layer (Subcutaneous Tissue) Exposed: Yes Necrotic Quality: Adherent Slough Tendon Exposed: No Muscle Exposed: No Joint Exposed: No Bone Exposed: No Periwound Skin Texture Texture Color No Abnormalities Noted: No No Abnormalities Noted: No Callus: No Atrophie Blanche: No Crepitus: No Cyanosis: No Excoriation: No Ecchymosis: No Blakely, Abhi ([id number]) Induration: No Erythema: No Rash: No Hemosiderin Staining: Yes Scarring: Yes Mottled: No Pallor: No Moisture Rubor: No No Abnormalities Noted: No Dry / Scaly: No Temperature / Pain Maceration: No Temperature: No Abnormality Tenderness on Palpation: Yes Wound Preparation Ulcer Cleansing: Rinsed/Irrigated with Saline, Other: soap and water, Topical Anesthetic Applied: Other: lidocaine 4%, Assessment Notes Theraskin Check. Treatment Notes Wound #4 (Right, Medial Lower Leg) 1. Cleansed with: Clean wound with Normal Saline Cleanse wound with antibacterial soap  and water 5. Secondary Dressing Applied ABD Pad Dry Gauze 7. Secured with 3 Layer Compression System - Right Lower Extremity Notes theraskin remains in place, unna to Engineer, production) Signed: 07/28/2016 1:25:50 PM By: Gretta Cool, RN, [name], [name], [name] Entered By: Gretta Cool, RN, [name], Kim on 07/28/2016 08:20:32

## 2016-08-04 ENCOUNTER — Encounter: Payer: Medicare Other | Admitting: Internal Medicine

## 2016-08-04 DIAGNOSIS — I87331 Chronic venous hypertension (idiopathic) with ulcer and inflammation of right lower extremity: Secondary | ICD-10-CM | POA: Diagnosis not present

## 2016-08-05 NOTE — Progress Notes (Signed)
ANNE, SEBRING ([id number]) Visit Report for 08/04/2016 Arrival Information Details [place], Date of Service: 08/04/2016 1:30 PM Patient Name: [name] Patient Account Number: [id number] Medical Record Treating RN: Baruch Gouty, RN, [name], Velva Harman [id number] Number: Other Clinician: Date of Birth/Sex: 11-22-1950 (66 y.o. Male) Treating [name] Primary Care Sravya Grissom: Royetta Crochet Orvil Faraone/Extender: G Referring Lillieanna Tuohy: Doristine Locks in Treatment: 7 Visit Information History Since Last Visit All ordered tests and consults were completed: No Patient Arrived: Ambulatory Added or deleted any medications: No Arrival Time: 13:25 Any new allergies or adverse reactions: No Accompanied By: self Had a fall or experienced change in No Transfer Assistance: None activities of daily living that may affect Patient Identification Verified: Yes risk of falls: Secondary Verification Process Yes Signs or symptoms of abuse/neglect since last No Completed: visito Patient Requires Transmission- No Hospitalized since last visit: No Based Precautions: Has Dressing in Place as Prescribed: Yes Patient Has Alerts: Yes Pain Present Now: No Patient Alerts: Patient on Blood Thinner warfarin Electronic Signature(s) Signed: 08/04/2016 4:11:25 PM By: [name] [name], RN Entered By: [name] on 08/04/2016 13:25:35 Hinderer, [name] ([id number]) -------------------------------------------------------------------------------- Encounter Discharge Information Details [place], Date of Service: 08/04/2016 1:30 PM Patient Name: [name] Patient Account Number: [id number] Medical Record Treating RN: Baruch Gouty, RN, [name], Velva Harman [id number] Number: Other Clinician: Date of Birth/Sex: 05/10/1951 (66 y.o. Male) Treating [name] Primary Care Kaiden Pech: Royetta Crochet Lakendrick Paradis/Extender: G Referring Dominique Ressel: Doristine Locks in Treatment: 7 Encounter Discharge Information Items Schedule Follow-up  Appointment: No Medication Reconciliation completed and No provided to Patient/Care Elim Peale: Patient Clinical Summary of Care: Declined Electronic Signature(s) Signed: 08/04/2016 1:54:06 PM By: Ruthine Dose Entered By: Ruthine Dose on 08/04/2016 13:54:06 [place], [name] ([id number]) -------------------------------------------------------------------------------- Lower Extremity Assessment Details [place], Date of Service: 08/04/2016 1:30 PM Patient Name: [name] Patient Account Number: [id number] Medical Record Treating RN: Baruch Gouty, RN, [name], Velva Harman [id number] Number: Other Clinician: Date of Birth/Sex: [birth year] (66 y.o. Male) Treating [name], [place] Primary Care Autumne Kallio: Royetta Crochet Mylan Schwarz/Extender: G Referring Sahas Sluka: Royetta Crochet Weeks in Treatment: 7 Edema Assessment Assessed: [Left: No] [Right: No] E[Left: dema] [Right: :] Calf Left: Right: Point of Measurement: 38 cm From Medial Instep cm 39.3 cm Ankle Left: Right: Point of Measurement: 12 cm From Medial Instep cm 27.8 cm Vascular Assessment Claudication: Claudication Assessment [Right:None] Pulses: Dorsalis Pedis Palpable: [Right:Yes] Posterior Tibial Extremity colors, hair growth, and conditions: Extremity Color: [Right:Dusky] Hair Growth on Extremity: [Right:Yes] Temperature of Extremity: [Right:Warm] Capillary Refill: [Right:< 3 seconds] Toe Nail Assessment Left: Right: Thick: Yes Discolored: Yes Deformed: Yes Improper Length and Hygiene: Yes Electronic Signature(s) Signed: 08/04/2016 4:11:25 PM By: [name] [name], RN [place], [place] ([id number]) Entered By: [name] on 08/04/2016 13:32:29 Tro[place]le, [name] ([id number]) -------------------------------------------------------------------------------- Multi Wound Chart Details [place], Date of Service: 08/04/2016 1:30 PM Patient Name: [name] Patient Account Number: [id number] Medical Record Treating RN: Baruch Gouty RN, [name],  Velva Harman [id number] Number: Other Clinician: Date of Birth/Sex: September 02, 1950 (65 y.o. Male) Treating [name] Primary Care Avnoor Koury: Royetta Crochet Griselle Rufer/Extender: G Referring Eleonore Shippee: Royetta Crochet Weeks in Treatment: 7 Vital Signs Height(in): 74 Pulse(bpm): 66 Weight(lbs): 252 Blood Pressure 138/76 (mmHg): Body Mass Index(BMI): 32 Temperature(F): 97.6 Respiratory Rate 19 (breaths/min): Photos: [4:No Photos] [N/A:N/A] Wound Location: [4:Right Lower Leg - Medial N/A] Wounding Event: [4:Gradually Appeared] [N/A:N/A] Primary Etiology: [4:Venous Leg Ulcer] [N/A:N/A] Comorbid History: [4:Arrhythmia, Hypertension, N/A Gout, Neuropathy] Date Acquired: [4:06/02/2016] [N/A:N/A] Weeks of Treatment: [4:7] [N/A:N/A] Wound Status: [4:Open] [N/A:N/A] Measurements L x W x D 2.5x1.5x0.3 [N/A:N/A] (cm) Area (cm) : [4:2.945] [N/A:N/A] Volume (cm) : [4:0.884] [  N/A:N/A] % Reduction in Area: [4:-13.60%] [N/A:N/A] % Reduction in Volume: -241.30% [N/A:N/A] Classification: [4:Partial Thickness] [N/A:N/A] Exudate Amount: [4:Large] [N/A:N/A] Exudate Type: [4:Serous] [N/A:N/A] Exudate Color: [4:amber] [N/A:N/A] Wound Margin: [4:Flat and Intact] [N/A:N/A] Granulation Amount: [4:Large (67-100%)] [N/A:N/A] Granulation Quality: [4:Red, Friable] [N/A:N/A] Necrotic Amount: [4:Small (1-33%)] [N/A:N/A] Exposed Structures: [4:Fat Layer (Subcutaneous N/A Tissue) Exposed: Yes Fascia: No Tendon: No Muscle: No] Joint: No Bone: No Epithelialization: Small (1-33%) N/A N/A Periwound Skin Texture: Scarring: Yes N/A N/A Excoriation: No Induration: No Callus: No Crepitus: No Rash: No Periwound Skin Maceration: No N/A N/A Moisture: Dry/Scaly: No Periwound Skin Color: Hemosiderin Staining: Yes N/A N/A Atrophie Blanche: No Cyanosis: No Ecchymosis: No Erythema: No Mottled: No Pallor: No Rubor: No Temperature: No Abnormality N/A N/A Tenderness on Yes N/A N/A Palpation: Wound  Preparation: Ulcer Cleansing: N/A N/A Rinsed/Irrigated with Saline, Other: soap and water Topical Anesthetic Applied: Other: lidocaine 4% Treatment Notes Electronic Signature(s) Signed: 08/04/2016 4:37:19 PM By: [name] MD Entered By: [name] on 08/04/2016 13:46:28 Pierre, [name] ([id number]) -------------------------------------------------------------------------------- Multi-Disciplinary Care Plan Details [place], Date of Service: 08/04/2016 1:30 PM Patient Name: [name] Patient Account Number: [id number] Medical Record Treating RN: Baruch Gouty RN, [name], Velva Harman [id number] Number: Other Clinician: Date of Birth/Sex: 21-Sep-1950 (65 y.o. Male) Treating [name], MICHAEL Primary Care Carlyn Mullenbach: Royetta Crochet Brena Windsor/Extender: G Referring Derrel Moore: Doristine Locks in Treatment: 7 Active Inactive ` Orientation to the Wound Care Program Nursing Diagnoses: Knowledge deficit related to the wound healing center program Goals: Patient/caregiver will verbalize understanding of the [facility] Program Date Initiated: 06/16/2016 Target Resolution Date: 08/15/2016 Goal Status: Active Interventions: Provide education on orientation to the wound center Notes: ` Venous Leg Ulcer Nursing Diagnoses: Potential for venous Insuffiency (use before diagnosis confirmed) Goals: Patient will maintain optimal edema control Date Initiated: 06/16/2016 Target Resolution Date: 08/19/2016 Goal Status: Active Interventions: Compression as ordered Notes: ` Wound/Skin Impairment Nursing Diagnoses: [name], [name] ([id number]) Impaired tissue integrity Goals: Patient/caregiver will verbalize understanding of skin care regimen Date Initiated: 06/16/2016 Target Resolution Date: 07/15/2016 Goal Status: Active Ulcer/skin breakdown will have a volume reduction of 30% by week 4 Date Initiated: 06/16/2016 Target Resolution Date: 07/15/2016 Goal Status: Active Ulcer/skin breakdown  will have a volume reduction of 50% by week 8 Date Initiated: 06/16/2016 Target Resolution Date: 07/15/2016 Goal Status: Active Ulcer/skin breakdown will have a volume reduction of 80% by week 12 Date Initiated: 06/16/2016 Target Resolution Date: 07/15/2016 Goal Status: Active Ulcer/skin breakdown will heal within 14 weeks Date Initiated: 06/16/2016 Target Resolution Date: 07/15/2016 Goal Status: Active Interventions: Assess patient/caregiver ability to obtain necessary supplies Assess patient/caregiver ability to perform ulcer/skin care regimen upon admission and as needed Assess ulceration(s) every visit Notes: Electronic Signature(s) Signed: 08/04/2016 4:11:25 PM By: [name] [name], RN Entered By: [name] on 08/04/2016 13:32:34 Condie, [name] ([id number]) -------------------------------------------------------------------------------- Pain Assessment Details [place], Date of Service: 08/04/2016 1:30 PM Patient Name: [name] Patient Account Number: [id number] Medical Record Treating RN: Baruch Gouty RN, [name], Velva Harman [id number] Number: Other Clinician: Date of Birth/Sex: 1950/06/17 (66 y.o. Male) Treating [name] Primary Care Isidra Mings: Royetta Crochet Shavonna Corella/Extender: G Referring Becky Colan: Doristine Locks in Treatment: 7 Active Problems Location of Pain Severity and Description of Pain Patient Has Paino No Site Locations With Dressing Change: No Pain Management and Medication Current Pain Management: Electronic Signature(s) Signed: 08/04/2016 4:11:25 PM By: [name] [name], RN Entered By: [name] on 08/04/2016 13:25:46 [place], [name] ([id number]) -------------------------------------------------------------------------------- Wound Assessment Details [place], Date of Service: 08/04/2016 1:30 PM Patient Name: [name] Patient Account Number:  [id number] Medical Record Treating RN: Afful, RN, [name], Velva Harman [id number] Number: Other Clinician: Date of Birth/Sex:  10/18/1950 (66 y.o. Male) Treating [name], MICHAEL Primary Care Madelina Sanda: Royetta Crochet Sherin Murdoch/Extender: G Referring Shelsea Hangartner: Doristine Locks in Treatment: 7 Wound Status Wound Number: 4 Primary Venous Leg Ulcer Etiology: Wound Location: Right Lower Leg - Medial Wound Status: Open Wounding Event: Gradually Appeared Comorbid Arrhythmia, Hypertension, Gout, Date Acquired: 06/02/2016 History: Neuropathy Weeks Of Treatment: 7 Clustered Wound: No Photos Photo Uploaded By: [name] on 08/04/2016 16:36:37 Wound Measurements Length: (cm) 2.5 Width: (cm) 1.5 Depth: (cm) 0.3 Area: (cm) 2.945 Volume: (cm) 0.884 % Reduction in Area: -13.6% % Reduction in Volume: -241.3% Epithelialization: Small (1-33%) Tunneling: No Undermining: No Wound Description Classification: Partial Thickness Wound Margin: Flat and Intact Exudate Amount: Large [name], Mark ([id number]) Foul Odor After Cleansing: No Slough/Fibrino Yes Exudate Type: Serous Exudate Color: amber Wound Bed Granulation Amount: Large (67-100%) Exposed Structure Granulation Quality: Red, Friable Fascia Exposed: No Necrotic Amount: Small (1-33%) Fat Layer (Subcutaneous Tissue) Exposed: Yes Necrotic Quality: Adherent Slough Tendon Exposed: No Muscle Exposed: No Joint Exposed: No Bone Exposed: No Periwound Skin Texture Texture Color No Abnormalities Noted: No No Abnormalities Noted: No Callus: No Atrophie Blanche: No Crepitus: No Cyanosis: No Excoriation: No Ecchymosis: No Induration: No Erythema: No Rash: No Hemosiderin Staining: Yes Scarring: Yes Mottled: No Pallor: No Moisture Rubor: No No Abnormalities Noted: No Dry / Scaly: No Temperature / Pain Maceration: No Temperature: No Abnormality Tenderness on Palpation: Yes Wound Preparation Ulcer Cleansing: Rinsed/Irrigated with Saline, Other: soap and water, Topical Anesthetic Applied: Other: lidocaine 4%, Electronic  Signature(s) Signed: 08/04/2016 4:11:25 PM By: [name] [name], RN Entered By: [name] on 08/04/2016 13:31:55 [place], [name] ([id number]) -------------------------------------------------------------------------------- Vitals Details [place], Date of Service: 08/04/2016 1:30 PM Patient Name: [facility] Patient Account Number: [id number] Medical Record Treating RN: Baruch Gouty, RN, [name], Velva Harman [id number] Number: Other Clinician: Date of Birth/Sex: August 11, 1950 (65 y.o. Male) Treating [name], [place] Primary Care Rihana Kiddy: Royetta Crochet Azka Steger/Extender: G Referring Kire Ferg: Doristine Locks in Treatment: 7 Vital Signs Time Taken: 13:25 Temperature (F): 97.6 Height (in): 74 Pulse (bpm): 66 Weight (lbs): 252 Respiratory Rate (breaths/min): 19 Body Mass Index (BMI): 32.4 Blood Pressure (mmHg): 138/76 Reference Range: 80 - 120 mg / dl Electronic Signature(s) Signed: 08/04/2016 4:11:25 PM By: [name] [name], RN Entered By: [name] on 08/04/2016 13:26:04
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
## 2016-08-10 DIAGNOSIS — I87331 Chronic venous hypertension (idiopathic) with ulcer and inflammation of right lower extremity: Secondary | ICD-10-CM | POA: Diagnosis not present

## 2016-08-10 NOTE — Progress Notes (Addendum)
BROLY, HATFIELD ([id number]) Visit Report for 08/10/2016 Arrival Information Details Taniguchi, Date of Service: 08/10/2016 8:15 AM Patient Name: [facility] Patient Account Number: [id number] Medical Record Treating RN: Baruch Gouty, RN, [name], Velva Harman [id number] Number: Other Clinician: Date of Birth/Sex: 1950-10-05 (66 y.o. Male) Treating [name] Primary Care Cristian Davitt: Royetta Crochet Brileigh Sevcik/Extender: G Referring Marley Pakula: Doristine Locks in Treatment: 7 Visit Information History Since Last Visit All ordered tests and consults were completed: No Patient Arrived: Ambulatory Added or deleted any medications: No Arrival Time: 08:13 Any new allergies or adverse reactions: No Accompanied By: self Had a fall or experienced change in No Transfer Assistance: None activities of daily living that may affect Patient Identification Verified: Yes risk of falls: Secondary Verification Process Yes Signs or symptoms of abuse/neglect since last No Completed: visito Patient Requires Transmission- No Hospitalized since last visit: No Based Precautions: Has Dressing in Place as Prescribed: Yes Patient Has Alerts: Yes Has Compression in Place as Prescribed: Yes Patient Alerts: Patient on Blood Pain Present Now: No Thinner warfarin Electronic Signature(s) Signed: 08/10/2016 8:50:24 AM By: [name] [name], RN Entered By: [name] on 08/10/2016 08:50:24 Fortenberry, [name] ([id number]) -------------------------------------------------------------------------------- Encounter Discharge Information Details Winnie, Date of Service: 08/10/2016 8:15 AM Patient Name: [name] Patient Account Number: [id number] Medical Record Treating RN: Baruch Gouty, RN, [name], Velva Harman [id number] Number: Other Clinician: Date of Birth/Sex: 1950/10/11 (66 y.o. Male) Treating [name] Primary Care Devynne Sturdivant: Royetta Crochet Cordelro Gautreau/Extender: G Referring Lubna Stegeman: Doristine Locks in Treatment: 7 Encounter  Discharge Information Items Discharge Pain Level: 0 Discharge Condition: Stable Ambulatory Status: Ambulatory Discharge Destination: Home Private Transportation: Auto Accompanied By: self Schedule Follow-up Appointment: No Medication Reconciliation completed and No provided to Patient/Care [name] [name]: Clinical Summary of Care: Electronic Signature(s) Signed: 08/10/2016 8:52:06 AM By: [name] [name], RN Entered By: [name] on 08/10/2016 08:52:06 Kady, [name] ([id number]) -------------------------------------------------------------------------------- Patient/Caregiver Education Details Jeanmarie, Date of Service: 08/10/2016 8:15 AM Patient Name: [name] Patient Account Number: [id number] Medical Record Treating RN: Baruch Gouty, RN, [name], Velva Harman [id number] Number: Other Clinician: Date of Birth/Gender: [birth year] (65 y.o. Male) Treating Dellia Nims, MICHAEL Primary Care Physician/Extender: Tonye Pearson Physician: Suella Grove in Treatment: 7 Referring Physician: Royetta Crochet Education Assessment Education Provided To: Patient Education Topics Provided Welcome To [facility]: Methods: Explain/Verbal Electronic Signature(s) Signed: 08/10/2016 4:40:29 PM By: [name] [name], RN Entered By: [name] on 08/10/2016 08:51:51 Axel, [name] ([id number]) -------------------------------------------------------------------------------- Wound Assessment Details Griffith, Date of Service: 08/10/2016 8:15 AM Patient Name: [name] Patient Account Number: [id number] Medical Record Treating RN: Baruch Gouty, RN, [name], Velva Harman [id number] Number: Other Clinician: Date of Birth/Sex: [birth year] (66 y.o. Male) Treating [name], MICHAEL Primary Care Anaika Santillano: Royetta Crochet Adynn Caseres/Extender: G Referring Lily Velasquez: Royetta Crochet Weeks in Treatment: 7 Wound Status Wound Number: 4 Primary Venous Leg Ulcer Etiology: Wound Location: Right Lower Leg - Medial Wound Status: Open Wounding Event:  Gradually Appeared Comorbid Arrhythmia, Hypertension, Gout, Date Acquired: 06/02/2016 History: Neuropathy Weeks Of Treatment: 7 Clustered Wound: No Photos Photo Uploaded By: [name] on 08/10/2016 16:23:59 Wound Measurements Length: (cm) 2.5 Width: (cm) 1.5 Depth: (cm) 0.3 Area: (cm) 2.945 Volume: (cm) 0.884 % Reduction in Area: -13.6% % Reduction in Volume: -241.3% Epithelialization: Small (1-33%) Wound Description Classification: Partial Thickness Wound Margin: Flat and Intact Exudate Amount: Large Cottone, Norm ([id number]) Foul Odor After Cleansing: No Slough/Fibrino Yes Exudate Type: Serous Exudate Color: amber Wound Bed Granulation Amount: Large (67-100%) Exposed Structure Granulation Quality: Red, Friable Fascia Exposed: No Necrotic Amount: Small (1-33%) Fat Layer (Subcutaneous Tissue) Exposed: Yes Necrotic Quality: Adherent  Slough Tendon Exposed: No Muscle Exposed: No Joint Exposed: No Bone Exposed: No Periwound Skin Texture Texture Color No Abnormalities Noted: No No Abnormalities Noted: No Callus: No Atrophie Blanche: No Crepitus: No Cyanosis: No Excoriation: No Ecchymosis: No Induration: No Erythema: No Rash: No Hemosiderin Staining: Yes Scarring: Yes Mottled: No Pallor: No Moisture Rubor: No No Abnormalities Noted: No Dry / Scaly: No Temperature / Pain Maceration: No Temperature: No Abnormality Wound Preparation Ulcer Cleansing: Rinsed/Irrigated with Saline, Other: soap and water, Treatment Notes Wound #4 (Right, Medial Lower Leg) 1. Cleansed with: Cleanse wound with antibacterial soap and water 5. Secondary Dressing Applied ABD Pad 7. Secured with 3 Layer Compression System - Right Lower Extremity Notes theraskin remains in place, unna to Engineer, production) Signed: 08/10/2016 8:50:59 AM By: [name] [name], RN Entered By: [name] on 08/10/2016 08:50:59

## 2016-08-17 ENCOUNTER — Encounter: Payer: Medicare Other | Attending: Nurse Practitioner | Admitting: Nurse Practitioner

## 2016-08-17 DIAGNOSIS — F17210 Nicotine dependence, cigarettes, uncomplicated: Secondary | ICD-10-CM | POA: Diagnosis not present

## 2016-08-17 DIAGNOSIS — I10 Essential (primary) hypertension: Secondary | ICD-10-CM | POA: Insufficient documentation

## 2016-08-17 DIAGNOSIS — Z86718 Personal history of other venous thrombosis and embolism: Secondary | ICD-10-CM | POA: Diagnosis not present

## 2016-08-17 DIAGNOSIS — Z7901 Long term (current) use of anticoagulants: Secondary | ICD-10-CM | POA: Insufficient documentation

## 2016-08-17 DIAGNOSIS — M109 Gout, unspecified: Secondary | ICD-10-CM | POA: Insufficient documentation

## 2016-08-17 DIAGNOSIS — I4891 Unspecified atrial fibrillation: Secondary | ICD-10-CM | POA: Diagnosis not present

## 2016-08-17 DIAGNOSIS — L97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: Secondary | ICD-10-CM | POA: Diagnosis not present

## 2016-08-17 DIAGNOSIS — I87331 Chronic venous hypertension (idiopathic) with ulcer and inflammation of right lower extremity: Secondary | ICD-10-CM | POA: Diagnosis present

## 2016-08-17 DIAGNOSIS — Z8673 Personal history of transient ischemic attack (TIA), and cerebral infarction without residual deficits: Secondary | ICD-10-CM | POA: Diagnosis not present

## 2016-08-17 DIAGNOSIS — I890 Lymphedema, not elsewhere classified: Secondary | ICD-10-CM | POA: Diagnosis not present

## 2016-08-18 NOTE — Progress Notes (Signed)
ASIM, GERSTEN ([id number]) Visit Report for 08/17/2016 Arrival Information Details [place], Date of Service: 08/17/2016 8:15 AM Patient Name: [name]: [id number] Medical Record Treating RN: Baruch Gouty, RN, [name], Velva Harman [id number] Number: Other Clinician: Date of Birth/Sex: July 07, 1950 (66 y.o. Male) Treating [name] Primary Care Keino Placencia: Royetta Crochet Merced Brougham/Extender: G Referring Omarrion Carmer: Doristine Locks in Treatment: 8 Visit Information History Since Last Visit All ordered tests and consults were completed: No Patient Arrived: Ambulatory Added or deleted any medications: No Arrival Time: 08:08 Any new allergies or adverse reactions: No Accompanied By: self Had a fall or experienced change in No Transfer Assistance: None activities of daily living that may affect Patient Identification Verified: Yes risk of falls: Secondary Verification Process Yes Hospitalized since last visit: No Completed: Has Dressing in Place as Prescribed: Yes Patient Requires Transmission- No Has Compression in Place as Prescribed: Yes Based Precautions: Pain Present Now: No Patient Has Alerts: Yes Patient Alerts: Patient on Blood Thinner warfarin Electronic Signature(s) Signed: 08/17/2016 5:03:43 PM By: [name] [name], RN Entered By: [name] on 08/17/2016 [place], [name] ([id number]) -------------------------------------------------------------------------------- Encounter Discharge Information Details Patient Name: [name] Date of Service: 08/17/2016 8:15 AM Medical Record Number: [id number] Patient Account Number: [id number] Date of Birth/Sex: 1951/02/07 (66 y.o. Male) Treating RN: Baruch Gouty, RN, [name], Velva Harman Primary Care Emaan Gary: Royetta Crochet Other Clinician: Referring Karsten Howry: Royetta Crochet Treating Rosalinda Seaman/Extender: Cathie Olden in Treatment: 8 Encounter Discharge Information Items Discharge Pain Level: 0 Discharge  Condition: Stable Ambulatory Status: Ambulatory Discharge Destination: Home Transportation: Private Auto Accompanied By: self Schedule Follow-up Appointment: No Medication Reconciliation completed and provided to Patient/Care No Taiyo Kozma: Provided on Clinical Summary of Care: 08/17/2016 Form Type Recipient Paper Patient WD Electronic Signature(s) Signed: 08/17/2016 5:03:43 PM By: [name] [name], RN Previous Signature: 08/17/2016 9:09:40 AM Version By: Ruthine Dose Entered By: [name] on 08/17/2016 09:10:34 [place]iteman, [name] ([id number]) -------------------------------------------------------------------------------- Lower Extremity Assessment Details Patient Name: [name] Date of Service: 08/17/2016 8:15 AM Medical Record Number: [id number] Patient Account Number: [id number] Date of Birth/Sex: 14-Feb-1951 (66 y.o. Male) Treating RN: Baruch Gouty, RN, [name], [name] Primary Care Timothey Dahlstrom: Royetta Crochet Other Clinician: Referring Kyoko Elsea: Royetta Crochet Treating Shriyan Arakawa/Extender: Cathie Olden in Treatment: 8 Edema Assessment Assessed: [Left: No] [Right: No] Edema: [Left: N] [Right: o] Calf Left: Right: Point of Measurement: 38 cm From Medial Instep cm 39.2 cm Ankle Left: Right: Point of Measurement: 12 cm From Medial Instep cm 27.6 cm Vascular Assessment Claudication: Claudication Assessment [Right:None] Pulses: Dorsalis Pedis Palpable: [Right:Yes] Posterior Tibial Extremity colors, hair growth, and conditions: Extremity Color: [Right:Dusky] Temperature of Extremity: [Right:Warm] Capillary Refill: [Right:< 3 seconds] Toe Nail Assessment Left: Right: Thick: Yes Discolored: Yes Deformed: Yes Improper Length and Hygiene: Yes Electronic Signature(s) Signed: 08/17/2016 5:03:43 PM By: [name] [name], RN Entered By: [name] on 08/17/2016 08:18:01 Hise, [name] ([id number]) Lingard, [name]  ([id number]) -------------------------------------------------------------------------------- Multi Wound Chart Details Patient Name: [name] Date of Service: 08/17/2016 8:15 AM Medical Record Number: [id number] Patient Account Number: [id number] Date of Birth/Sex: [birth year] (66 y.o. Male) Treating RN: Baruch Gouty, RN, [name], [place] Primary Care Sahian Kerney: Royetta Crochet Other Clinician: Referring Rashada Klontz: Royetta Crochet Treating Ahmaya Ostermiller/Extender: Cathie Olden in Treatment: 8 Vital Signs Height(in): 74 Pulse(bpm): 68 Weight(lbs): 252 Blood Pressure 142/78 (mmHg): Body Mass Index(BMI): 32 Temperature(F): 97.7 Respiratory Rate 18 (breaths/min): Photos: [4:No Photos] [N/A:N/A] Wound Location: [4:Right Lower Leg - Medial N/A] Wounding Event: [4:Gradually Appeared] [N/A:N/A] Primary Etiology: [4:Venous Leg Ulcer] [N/A:N/A] Comorbid History: [4:Arrhythmia, Hypertension, N/A Gout, Neuropathy] Date Acquired: [4:06/02/2016] [  N/A:N/A] Weeks of Treatment: [4:8] [N/A:N/A] Wound Status: [4:Open] [N/A:N/A] Measurements L x W x D 2.2x2x0.3 [N/A:N/A] (cm) Area (cm) : [4:3.456] [N/A:N/A] Volume (cm) : [4:1.037] [N/A:N/A] % Reduction in Area: [4:-33.30%] [N/A:N/A] % Reduction in Volume: -300.40% [N/A:N/A] Classification: [4:Partial Thickness] [N/A:N/A] Exudate Amount: [4:Large] [N/A:N/A] Exudate Type: [4:Serous] [N/A:N/A] Exudate Color: [4:amber] [N/A:N/A] Wound Margin: [4:Flat and Intact] [N/A:N/A] Granulation Amount: [4:Large (67-100%)] [N/A:N/A] Granulation Quality: [4:Red, Friable] [N/A:N/A] Necrotic Amount: [4:Small (1-33%)] [N/A:N/A] Exposed Structures: [4:Fat Layer (Subcutaneous N/A Tissue) Exposed: Yes Fascia: No Tendon: No Muscle: No Joint: No Bone: No] Epithelialization: Small (1-33%) N/A N/A Debridement: Open Wound/Selective N/A N/A ([id number]) - Selective Pre-procedure 08:48 N/A N/A Verification/Time Out Taken: Pain Control: Lidocaine 4% Topical N/A  N/A Solution Tissue Debrided: Fibrin/Slough, N/A N/A Subcutaneous Level: Non-Viable Tissue N/A N/A Debridement Area (sq 4.4 N/A N/A cm): Instrument: Blade N/A N/A Bleeding: Minimum N/A N/A Hemostasis Achieved: Pressure N/A N/A Procedural Pain: 0 N/A N/A Post Procedural Pain: 0 N/A N/A Debridement Treatment Procedure was tolerated N/A N/A Response: well Post Debridement 2x2.2x0.3 N/A N/A Measurements L x W x D (cm) Post Debridement 1.037 N/A N/A Volume: (cm) Periwound Skin Texture: Scarring: Yes N/A N/A Excoriation: No Induration: No Callus: No Crepitus: No Rash: No Periwound Skin Maceration: No N/A N/A Moisture: Dry/Scaly: No Periwound Skin Color: Hemosiderin Staining: Yes N/A N/A Atrophie Blanche: No Cyanosis: No Ecchymosis: No Erythema: No Mottled: No Pallor: No Rubor: No Temperature: No Abnormality N/A N/A Tenderness on Yes N/A N/A Palpation: Wound Preparation: Ulcer Cleansing: N/A N/A Rinsed/Irrigated with Saline, Other: soap and water Eyer, Chord ([id number]) Topical Anesthetic Applied: None Procedures Performed: Cellular or Tissue Based N/A N/A Product Debridement Treatment Notes Wound #4 (Right, Medial Lower Leg) 1. Cleansed with: Cleanse wound with antibacterial soap and water 3. Peri-wound Care: Skin Prep 4. Dressing Applied: Other dressing (specify in notes) 5. Secondary Dressing Applied ABD Pad Dry Gauze 7. Secured with 3 Layer Compression System - Right Lower Extremity Notes theraskin remains in place, unna to Engineer, production) Signed: 08/17/2016 9:20:54 AM By: Lawanda Cousins Entered By: Lawanda Cousins on 08/17/2016 09:20:54 More[place], [name] ([id number]) -------------------------------------------------------------------------------- Multi-Disciplinary Care Plan Details Patient Name: [name] Date of Service: 08/17/2016 8:15 AM Medical Record Number: [id number] Patient Account Number: [id number] Date of  Birth/Sex: Oct 03, 1950 (66 y.o. Male) Treating RN: Baruch Gouty, RN, [name], Velva Harman Primary Care Sadeen Wiegel: Royetta Crochet Other Clinician: Referring Rael Yo: Royetta Crochet Treating Lindsey Demonte/Extender: Cathie Olden in Treatment: 8 Active Inactive ` Orientation to the Wound Care Program Nursing Diagnoses: Knowledge deficit related to the wound healing center program Goals: Patient/caregiver will verbalize understanding of the [place] Program Date Initiated: 06/16/2016 Target Resolution Date: 08/15/2016 Goal Status: Active Interventions: Provide education on orientation to the wound center Notes: ` Venous Leg Ulcer Nursing Diagnoses: Potential for venous Insuffiency (use before diagnosis confirmed) Goals: Patient will maintain optimal edema control Date Initiated: 06/16/2016 Target Resolution Date: 08/19/2016 Goal Status: Active Interventions: Compression as ordered Notes: ` Wound/Skin Impairment Nursing Diagnoses: Impaired tissue integrity Vogel, [name] ([id number]) Goals: Patient/caregiver will verbalize understanding of skin care regimen Date Initiated: 06/16/2016 Target Resolution Date: 07/15/2016 Goal Status: Active Ulcer/skin breakdown will have a volume reduction of 30% by week 4 Date Initiated: 06/16/2016 Target Resolution Date: 07/15/2016 Goal Status: Active Ulcer/skin breakdown will have a volume reduction of 50% by week 8 Date Initiated: 06/16/2016 Target Resolution Date: 07/15/2016 Goal Status: Active Ulcer/skin breakdown will have a volume reduction of 80% by week 12 Date Initiated: 06/16/2016 Target Resolution Date: 07/15/2016 Goal  Status: Active Ulcer/skin breakdown will heal within 14 weeks Date Initiated: 06/16/2016 Target Resolution Date: 07/15/2016 Goal Status: Active Interventions: Assess patient/caregiver ability to obtain necessary supplies Assess patient/caregiver ability to perform ulcer/skin care regimen upon admission and as needed Assess  ulceration(s) every visit Notes: Electronic Signature(s) Signed: 08/17/2016 5:03:43 PM By: [name] [name], RN Entered By: [name] on 08/17/2016 08:18:05 [place]field, [name] ([id number]) -------------------------------------------------------------------------------- Pain Assessment Details Patient Name: [name] Date of Service: 08/17/2016 8:15 AM Medical Record Number: [id number] Patient Account Number: [id number] Date of Birth/Sex: Dec 10, 1950 (66 y.o. Male) Treating RN: Baruch Gouty, RN, [name], Velva Harman Primary Care Cherine Drumgoole: Royetta Crochet Other Clinician: Referring Swara Donze: Royetta Crochet Treating Lella Mullany/Extender: Cathie Olden in Treatment: 8 Active Problems Location of Pain Severity and Description of Pain Patient Has Paino No Site Locations With Dressing Change: No Pain Management and Medication Current Pain Management: Electronic Signature(s) Signed: 08/17/2016 5:03:43 PM By: [name] [name], RN Entered By: [name] on 08/17/2016 08:13:21 Easom, [name] ([id number]) -------------------------------------------------------------------------------- Patient/Caregiver Education Details Patient Name: [name] Date of Service: 08/17/2016 8:15 AM Medical Record Number: [id number] Patient Account Number: [id number] Date of Birth/Gender: 1951-01-26 (66 y.o. Male) Treating RN: Baruch Gouty, RN, [name], Velva Harman Primary Care Physician: Royetta Crochet Other Clinician: Referring Physician: Royetta Crochet Treating Physician/Extender: Cathie Olden in Treatment: 8 Education Assessment Education Provided To: Patient Education Topics Provided Welcome To [facility]: Methods: Explain/Verbal Responses: State content correctly Electronic Signature(s) Signed: 08/17/2016 5:03:43 PM By: [name] [name], RN Entered By: [name] on 08/17/2016 09:10:43 Scullin, [name]  ([id number]) -------------------------------------------------------------------------------- Wound Assessment Details Patient Name: [name] Date of Service: 08/17/2016 8:15 AM Medical Record Number: [id number] Patient Account Number: [id number] Date of Birth/Sex: 1950-10-12 (66 y.o. Male) Treating RN: Baruch Gouty, RN, [name], Velva Harman Primary Care Chaniqua Brisby: Royetta Crochet Other Clinician: Referring Ileane Sando: Royetta Crochet Treating Anael Rosch/Extender: Cathie Olden in Treatment: 8 Wound Status Wound Number: 4 Primary Venous Leg Ulcer Etiology: Wound Location: Right Lower Leg - Medial Wound Status: Open Wounding Event: Gradually Appeared Comorbid Arrhythmia, Hypertension, Gout, Date Acquired: 06/02/2016 History: Neuropathy Weeks Of Treatment: 8 Clustered Wound: No Photos Photo Uploaded By: [name] on 08/17/2016 16:59:24 Wound Measurements Length: (cm) 2.2 Width: (cm) 2 Depth: (cm) 0.3 Area: (cm) 3.456 Volume: (cm) 1.037 % Reduction in Area: -33.3% % Reduction in Volume: -300.4% Epithelialization: Small (1-33%) Tunneling: No Undermining: No Wound Description Classification: Partial Thickness Wound Margin: Flat and Intact Exudate Amount: Large Exudate Type: Serous Exudate Color: amber Freet, [place] ([id number]) Foul Odor After Cleansing: No Slough/Fibrino Yes Wound Bed Granulation Amount: Large (67-100%) Exposed Structure Granulation Quality: Red, Friable Fascia Exposed: No Necrotic Amount: Small (1-33%) Fat Layer (Subcutaneous Tissue) Exposed: Yes Necrotic Quality: Adherent Slough Tendon Exposed: No Muscle Exposed: No Joint Exposed: No Bone Exposed: No Periwound Skin Texture Texture Color No Abnormalities Noted: No No Abnormalities Noted: No Callus: No Atrophie Blanche: No Crepitus: No Cyanosis: No Excoriation: No Ecchymosis: No Induration: No Erythema: No Rash: No Hemosiderin Staining: Yes Scarring: Yes Mottled: No Pallor:  No Moisture Rubor: No No Abnormalities Noted: No Dry / Scaly: No Temperature / Pain Maceration: No Temperature: No Abnormality Tenderness on Palpation: Yes Wound Preparation Ulcer Cleansing: Rinsed/Irrigated with Saline, Other: soap and water, Topical Anesthetic Applied: None Treatment Notes Wound #4 (Right, Medial Lower Leg) 1. Cleansed with: Cleanse wound with antibacterial soap and water 3. Peri-wound Care: Skin Prep 4. Dressing Applied: Other dressing (specify in notes) 5. Secondary Dressing Applied ABD Pad Dry Gauze 7. Secured with 3 Layer Compression System -  Right Lower Extremity Notes theraskin remains in place, unna to anchor Electronic Signature(s) Signed: 08/17/2016 5:03:43 PM By: [name] [name], RN Simmer, Butler ([id number]) Entered By: [name] on 08/17/2016 08:15:40 Mwangi, [name] ([id number]) -------------------------------------------------------------------------------- Vitals Details Patient Name: [name] Date of Service: 08/17/2016 8:15 AM Medical Record Number: [id number] Patient Account Number: [id number] Date of Birth/Sex: January 10, 1951 (66 y.o. Male) Treating RN: Afful, RN, [name], Velva Harman Primary Care Wilmot Quevedo: Royetta Crochet Other Clinician: Referring Delayza Lungren: Royetta Crochet Treating Layney Gillson/Extender: Cathie Olden in Treatment: 8 Vital Signs Time Taken: 08:12 Temperature (F): 97.7 Height (in): 74 Pulse (bpm): 68 Weight (lbs): 252 Respiratory Rate (breaths/min): 18 Body Mass Index (BMI): 32.4 Blood Pressure (mmHg): 142/78 Reference Range: 80 - 120 mg / dl Electronic Signature(s) Signed: 08/17/2016 5:03:43 PM By: [name] [name], RN Entered By: [name] on 08/17/2016 08:20:29
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
## 2016-08-23 ENCOUNTER — Encounter (INDEPENDENT_AMBULATORY_CARE_PROVIDER_SITE_OTHER): Payer: Self-pay

## 2016-08-23 ENCOUNTER — Ambulatory Visit (INDEPENDENT_AMBULATORY_CARE_PROVIDER_SITE_OTHER): Payer: Self-pay | Admitting: Vascular Surgery

## 2016-08-24 DIAGNOSIS — I87331 Chronic venous hypertension (idiopathic) with ulcer and inflammation of right lower extremity: Secondary | ICD-10-CM | POA: Diagnosis not present

## 2016-08-26 NOTE — Progress Notes (Signed)
JAYMIR, STRUBLE ([id number]) Visit Report for 08/24/2016 Arrival Information Details Farquharson, Date of Service: 08/24/2016 8:15 AM Patient Name: [name] Patient Account Number: [id number] Medical Record Treating RN: Baruch Gouty, RN, [name], Velva Harman [id number] Number: Other Clinician: Date of Birth/Sex: 10/05/1950 (66 y.o. Male) Treating [name] Primary Care Ollis Daudelin: Royetta Crochet Carinne Brandenburger/Extender: G Referring Kerra Guilfoil: Doristine Locks in Treatment: 9 Visit Information History Since Last Visit All ordered tests and consults were completed: No Patient Arrived: Ambulatory Added or deleted any medications: No Arrival Time: 08:09 Any new allergies or adverse reactions: No Accompanied By: self Had a fall or experienced change in No Transfer Assistance: None activities of daily living that may affect Patient Identification Verified: Yes risk of falls: Secondary Verification Process Yes Signs or symptoms of abuse/neglect since last No Completed: visito Patient Requires Transmission- No Hospitalized since last visit: No Based Precautions: Has Dressing in Place as Prescribed: Yes Patient Has Alerts: Yes Has Compression in Place as Prescribed: Yes Patient Alerts: Patient on Blood Pain Present Now: No Thinner warfarin Electronic Signature(s) Signed: 08/24/2016 5:31:29 PM By: [name] [name], RN Entered By: [name] on 08/24/2016 08:11:51 Stebner, [name] ([id number]) -------------------------------------------------------------------------------- Encounter Discharge Information Details Reidel, Date of Service: 08/24/2016 8:15 AM Patient Name: [name] Patient Account Number: [id number] Medical Record Treating RN: Baruch Gouty RN, [name], Velva Harman [id number] Number: Other Clinician: Date of Birth/Sex: 05/04/1951 (66 y.o. Male) Treating [name] Primary Care Lorain Fettes: Royetta Crochet Katoria Yetman/Extender: G Referring Myley Bahner: Doristine Locks in Treatment: 9 Encounter  Discharge Information Items Discharge Pain Level: 0 Discharge Condition: Stable Ambulatory Status: Ambulatory Discharge Destination: Home Private Transportation: Auto Accompanied By: self Schedule Follow-up Appointment: No Medication Reconciliation completed and No provided to Patient/Care Meagon Duskin: Clinical Summary of Care: Electronic Signature(s) Signed: 08/24/2016 8:37:01 AM By: [name] [name], RN Entered By: [name] on 08/24/2016 08:37:01 Assad, [name] ([id number]) -------------------------------------------------------------------------------- Pain Assessment Details Morford, Date of Service: 08/24/2016 8:15 AM Patient Name: [name] Patient Account Number: [id number] Medical Record Treating RN: Baruch Gouty RN, [name], Velva Harman [id number] Number: Other Clinician: Date of Birth/Sex: 1951-01-01 (66 y.o. Male) Treating [name] Primary Care Wm Sahagun: Royetta Crochet Sahira Cataldi/Extender: G Referring Jolayne Branson: Doristine Locks in Treatment: 9 Active Problems Location of Pain Severity and Description of Pain Patient Has Paino No Site Locations With Dressing Change: No Pain Management and Medication Current Pain Management: Electronic Signature(s) Signed: 08/24/2016 5:31:29 PM By: [name] [name], RN Entered By: [name] on 08/24/2016 08:12:01 Eslinger, [name] ([id number]) -------------------------------------------------------------------------------- Patient/Caregiver Education Details Mousseau, Date of Service: 08/24/2016 8:15 AM Patient Name: [name] Patient Account Number: [id number] Medical Record Treating RN: Baruch Gouty RN, [name], Velva Harman [id number] Number: Other Clinician: Date of Birth/Gender: 1950/10/03 (66 y.o. Male) Treating [name], MICHAEL Primary Care Physician/Extender: Tonye Pearson Physician: Suella Grove in Treatment: 9 Referring Physician: Royetta Crochet Education Assessment Education Provided To: Patient Education Topics Provided Welcome To The  [place]: Methods: Explain/Verbal Responses: State content correctly Electronic Signature(s) Signed: 08/24/2016 5:31:29 PM By: [name] [name], RN Entered By: [name] on 08/24/2016 08:36:42 Logiudice, [name] ([id number]) -------------------------------------------------------------------------------- Wound Assessment Details Kimbrell, Date of Service: 08/24/2016 8:15 AM Patient Name: [name] Patient Account Number: [id number] Medical Record Treating RN: Baruch Gouty RN, [name], Velva Harman [id number] Number: Other Clinician: Date of Birth/Sex: 1950-10-25 (66 y.o. Male) Treating [name] Primary Care Shanetha Bradham: Royetta Crochet Amara Justen/Extender: G Referring Taym Twist: Royetta Crochet Weeks in Treatment: 9 Wound Status Wound Number: 4 Primary Venous Leg Ulcer Etiology: Wound Location: Right Lower Leg - Medial Wound Status: Open Wounding Event: Gradually Appeared Comorbid Arrhythmia, Hypertension, Gout, Date  Acquired: 06/02/2016 History: Neuropathy Weeks Of Treatment: 9 Clustered Wound: No Photos Photo Uploaded By: [name] on 08/24/2016 17:26:31 Wound Measurements Length: (cm) 2.2 Width: (cm) 2 Depth: (cm) 0.3 Area: (cm) 3.456 Volume: (cm) 1.037 % Reduction in Area: -33.3% % Reduction in Volume: -300.4% Epithelialization: Small (1-33%) Tunneling: No Undermining: No Wound Description Classification: Partial Thickness Wound Margin: Flat and Intact Exudate Amount: Large Mcconaha, Antonino ([id number]) Foul Odor After Cleansing: No Slough/Fibrino Yes Exudate Type: Serous Exudate Color: amber Wound Bed Granulation Amount: Large (67-100%) Exposed Structure Granulation Quality: Red, Friable Fascia Exposed: No Necrotic Amount: Small (1-33%) Fat Layer (Subcutaneous Tissue) Exposed: Yes Necrotic Quality: Adherent Slough Tendon Exposed: No Muscle Exposed: No Joint Exposed: No Bone Exposed: No Periwound Skin Texture Texture Color No Abnormalities Noted: No No  Abnormalities Noted: No Callus: No Atrophie Blanche: No Crepitus: No Cyanosis: No Excoriation: No Ecchymosis: No Induration: No Erythema: No Rash: No Hemosiderin Staining: Yes Scarring: Yes Mottled: No Pallor: No Moisture Rubor: No No Abnormalities Noted: No Dry / Scaly: No Temperature / Pain Maceration: No Temperature: No Abnormality Tenderness on Palpation: Yes Wound Preparation Ulcer Cleansing: Rinsed/Irrigated with Saline, Other: soap and water, Topical Anesthetic Applied: None Assessment Notes Measurement carried over from last visit due to Thera skin in place Treatment Notes Wound #4 (Right, Medial Lower Leg) 1. Cleansed with: Cleanse wound with antibacterial soap and water 5. Secondary Dressing Applied ABD Pad 7. Secured with 3 Layer Compression System - Right Lower Extremity Notes theraskin remains in place, unna to Engineer, production) Signed: 08/24/2016 5:31:29 PM By: [name] [name], RN Sellick, [place] ([id number]) Entered By: [name] on 08/24/2016 08:13:14

## 2016-08-27 ENCOUNTER — Encounter (INDEPENDENT_AMBULATORY_CARE_PROVIDER_SITE_OTHER): Payer: Self-pay | Admitting: Vascular Surgery

## 2016-08-27 ENCOUNTER — Ambulatory Visit (INDEPENDENT_AMBULATORY_CARE_PROVIDER_SITE_OTHER): Payer: Medicare Other | Admitting: Vascular Surgery

## 2016-08-27 VITALS — BP 146/87 | HR 63 | Resp 16 | Ht 74.0 in | Wt 249.0 lb

## 2016-08-27 DIAGNOSIS — I83023 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: Secondary | ICD-10-CM | POA: Diagnosis not present

## 2016-08-27 DIAGNOSIS — L97329 Non-pressure chronic ulcer of left ankle with unspecified severity: Secondary | ICD-10-CM

## 2016-08-27 DIAGNOSIS — L97309 Non-pressure chronic ulcer of unspecified ankle with unspecified severity: Principal | ICD-10-CM

## 2016-08-27 NOTE — Assessment & Plan Note (Signed)
See laser note 

## 2016-08-27 NOTE — Progress Notes (Signed)
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 The ultrasound machine was used to visualize the saphenous vein and the incompetent small saphenous vein throughout their course. A segment of the GSV in the mid to upper calf was selected for access. The saphenous vein was accessed without difficulty using ultrasound guidance with a micro puncture needle. A 0.018 wire was placed to the saphenofemoral junction. The 65-cm sheath was then placed over the wire and the wire and dilator were removed. The laser fiber was placed through the sheath and its tip was placed approximately 4-5 cm below the saphenofemoral junction. Tumescent anesthesia was then created with a dilute lidocaine solution. Laser energy was then delivered with constant withdrawal of the sheath and laser fiber. Approximately 1807 Joules of energy were delivered over a length of 46 cm using the 1470 Hz Venocare machine at [employer].   I then turned my attention to the incompetent small saphenous vein. This was accessed without difficulty in the mid to upper calf just above several incompetent branches with the micro puncture needle. A 0.018 wire was used to upsize for the 65-cm sheath. The laser fiber was then placed through the sheath and parked about 5 cm above the saphenopopliteal junction. Tumescent anesthesia was then created with a dilute lidocaine solution. An additional 552 Joules of energy were delivered over a length of 12 cm.  Sterile dressings were placed. The patient tolerated the procedure well without complications.

## 2016-08-31 ENCOUNTER — Encounter: Payer: Medicare Other | Admitting: Internal Medicine

## 2016-08-31 DIAGNOSIS — I87331 Chronic venous hypertension (idiopathic) with ulcer and inflammation of right lower extremity: Secondary | ICD-10-CM | POA: Diagnosis not present

## 2016-08-31 NOTE — Progress Notes (Addendum)
VICTORMANUEL, MCLURE ([id number]) Visit Report for 08/31/2016 Arrival Information Details Privette, Date of Service: 08/31/2016 8:15 AM Patient Name: [facility] Patient Account Number: [id number] Medical Record Treating RN: Baruch Gouty, RN, [name], Velva Harman [id number] Number: Other Clinician: Date of Birth/Sex: 1950-07-21 (66 y.o. Male) Treating [name] Primary Care Elysia Grand: Royetta Crochet Cleto Claggett/Extender: G Referring Vernor Monnig: Doristine Locks in Treatment: 10 Visit Information History Since Last Visit All ordered tests and consults were completed: No Patient Arrived: Ambulatory Added or deleted any medications: No Arrival Time: 08:12 Any new allergies or adverse reactions: No Accompanied By: self Had a fall or experienced change in No Transfer Assistance: None activities of daily living that may affect Patient Identification Verified: Yes risk of falls: Secondary Verification Process Yes Signs or symptoms of abuse/neglect since last No Completed: visito Patient Requires Transmission- No Hospitalized since last visit: No Based Precautions: Has Dressing in Place as Prescribed: Yes Patient Has Alerts: Yes Has Compression in Place as Prescribed: Yes Patient Alerts: Patient on Blood Pain Present Now: No Thinner warfarin Electronic Signature(s) Signed: 08/31/2016 8:12:33 AM By: [name] [name], RN Entered By: [name] on 08/31/2016 08:12:32 Scheibel, [name] ([id number]) -------------------------------------------------------------------------------- Encounter Discharge Information Details Seals, Date of Service: 08/31/2016 8:15 AM Patient Name: [name] Patient Account Number: [id number] Medical Record Treating RN: Baruch Gouty, RN, [name], Velva Harman [id number] Number: Other Clinician: Date of Birth/Sex: 1950-09-12 (66 y.o. Male) Treating [name] Primary Care Kora Groom: Royetta Crochet Armstrong Creasy/Extender: G Referring Mykiah Schmuck: Doristine Locks in Treatment: 10 Encounter  Discharge Information Items Discharge Pain Level: 0 Discharge Condition: Stable Ambulatory Status: Ambulatory Discharge Destination: Home Transportation: Private Auto Accompanied By: self Schedule Follow-up Appointment: No Medication Reconciliation completed No and provided to Patient/Care Heloise Gordan: Patient Clinical Summary of Care: Declined Electronic Signature(s) Signed: 08/31/2016 8:55:50 AM By: Ruthine Dose Entered By: Ruthine Dose on 08/31/2016 08:55:50 Burkley, [name] ([id number]) -------------------------------------------------------------------------------- Lower Extremity Assessment Details Mccullers, Date of Service: 08/31/2016 8:15 AM Patient Name: [name] Patient Account Number: [id number] Medical Record Treating RN: Baruch Gouty, RN, [name], Velva Harman [id number] Number: Other Clinician: Date of Birth/Sex: July 18, 1950 (66 y.o. Male) Treating [name], [place] Primary Care Kierstin January: Royetta Crochet Oak Dorey/Extender: G Referring Oluwatoyin Banales: Royetta Crochet Weeks in Treatment: 10 Edema Assessment Assessed: [Left: No] [Right: No] Edema: [Left: N] [Right: o] Calf Left: Right: Point of Measurement: 38 cm From Medial Instep cm 38.6 cm Ankle Left: Right: Point of Measurement: 12 cm From Medial Instep cm 27.2 cm Vascular Assessment Claudication: Claudication Assessment [Right:None] Pulses: Dorsalis Pedis Palpable: [Right:Yes] Posterior Tibial Extremity colors, hair growth, and conditions: Extremity Color: [Right:Dusky] Hair Growth on Extremity: [Right:Yes] Temperature of Extremity: [Right:Warm] Capillary Refill: [Right:< 3 seconds] Toe Nail Assessment Left: Right: Thick: Yes Discolored: Yes Deformed: Yes Improper Length and Hygiene: Yes Electronic Signature(s) Signed: 08/31/2016 8:13:15 AM By: [name] [name], RN Ent, [place] ([id number]) Entered By: [name] on 08/31/2016 08:13:15 Leathers, [name]  ([id number]) -------------------------------------------------------------------------------- Multi Wound Chart Details Wender, Date of Service: 08/31/2016 8:15 AM Patient Name: [name] Patient Account Number: [id number] Medical Record Treating RN: Baruch Gouty RN, [name], Velva Harman [id number] Number: Other Clinician: Date of Birth/Sex: 12/07/1950 (65 y.o. Male) Treating [name] Primary Care Aric Jost: Royetta Crochet Pamula Luther/Extender: G Referring Orlondo Holycross: Royetta Crochet Weeks in Treatment: 10 Vital Signs Height(in): 74 Pulse(bpm): 66 Weight(lbs): 252 Blood Pressure 133/98 (mmHg): Body Mass Index(BMI): 32 Temperature(F): 97.3 Respiratory Rate 18 (breaths/min): Photos: [4:No Photos] [N/A:N/A] Wound Location: [4:Right Lower Leg - Medial N/A] Wounding Event: [4:Gradually Appeared] [N/A:N/A] Primary Etiology: [4:Venous Leg Ulcer] [N/A:N/A] Comorbid History: [4:Arrhythmia, Hypertension, N/A Gout, Neuropathy] Date Acquired: [4:06/02/2016] [N/A:N/A]  Weeks of Treatment: [4:10] [N/A:N/A] Wound Status: [4:Open] [N/A:N/A] Measurements L x W x D 2.5x1.8x0.2 [N/A:N/A] (cm) Area (cm) : [4:3.534] [N/A:N/A] Volume (cm) : [4:0.707] [N/A:N/A] % Reduction in Area: [4:-36.30%] [N/A:N/A] % Reduction in Volume: -173.00% [N/A:N/A] Classification: [4:Partial Thickness] [N/A:N/A] Exudate Amount: [4:Large] [N/A:N/A] Exudate Type: [4:Serous] [N/A:N/A] Exudate Color: [4:amber] [N/A:N/A] Wound Margin: [4:Flat and Intact] [N/A:N/A] Granulation Amount: [4:Medium (34-66%)] [N/A:N/A] Granulation Quality: [4:Red, Friable] [N/A:N/A] Necrotic Amount: [4:Medium (34-66%)] [N/A:N/A] Exposed Structures: [4:Fat Layer (Subcutaneous N/A Tissue) Exposed: Yes Fascia: No Tendon: No Muscle: No] Joint: No Bone: No Epithelialization: Small (1-33%) N/A N/A Periwound Skin Texture: Scarring: Yes N/A N/A Excoriation: No Induration: No Callus: No Crepitus: No Rash: No Periwound Skin Maceration: No N/A  N/A Moisture: Dry/Scaly: No Periwound Skin Color: Hemosiderin Staining: Yes N/A N/A Atrophie Blanche: No Cyanosis: No Ecchymosis: No Erythema: No Mottled: No Pallor: No Rubor: No Temperature: No Abnormality N/A N/A Tenderness on Yes N/A N/A Palpation: Wound Preparation: Ulcer Cleansing: N/A N/A Rinsed/Irrigated with Saline Topical Anesthetic Applied: None, Other: Lidocaine 4% Procedures Performed: Cellular or Tissue Based N/A N/A Product Treatment Notes Wound #4 (Right, Medial Lower Leg) 1. Cleansed with: Cleanse wound with antibacterial soap and water 4. Dressing Applied: Other dressing (specify in notes) 5. Secondary Dressing Applied ABD Pad Kerlix/Conform 7. Secured with Tape Notes theraskin remains in place, unna to [employer]) DOUBLIN, Ken Caryl ([id number]) Signed: 09/01/2016 7:57:33 AM By: [name] MD Previous Signature: 08/31/2016 8:32:02 AM Version By: [name] [name], RN Entered By: [name] on 08/31/2016 09:46:52 Weisensel, [name] ([id number]) -------------------------------------------------------------------------------- Multi-Disciplinary Care Plan Details [place]atwood, Date of Service: 08/31/2016 8:15 AM Patient Name: [name] Patient Account Number: [id number] Medical Record Treating RN: Baruch Gouty RN, [name], Velva Harman [id number] Number: Other Clinician: Date of Birth/Sex: 1951-02-22 (65 y.o. Male) Treating [name], [place] Primary Care Sutton Plake: Royetta Crochet Tamieka Rancourt/Extender: G Referring Gretna Bergin: Doristine Locks in Treatment: 10 Active Inactive ` Orientation to the Wound Care Program Nursing Diagnoses: Knowledge deficit related to the wound healing center program Goals: Patient/caregiver will verbalize understanding of the [facility] Program Date Initiated: 06/16/2016 Target Resolution Date: 08/15/2016 Goal Status: Active Interventions: Provide education on orientation to the wound  center Notes: ` Venous Leg Ulcer Nursing Diagnoses: Potential for venous Insuffiency (use before diagnosis confirmed) Goals: Patient will maintain optimal edema control Date Initiated: 06/16/2016 Target Resolution Date: 08/19/2016 Goal Status: Active Interventions: Compression as ordered Notes: ` Wound/Skin Impairment Nursing Diagnoses: MARTIE, FULGHAM ([id number]) Impaired tissue integrity Goals: Patient/caregiver will verbalize understanding of skin care regimen Date Initiated: 06/16/2016 Target Resolution Date: 07/15/2016 Goal Status: Active Ulcer/skin breakdown will have a volume reduction of 30% by week 4 Date Initiated: 06/16/2016 Target Resolution Date: 07/15/2016 Goal Status: Active Ulcer/skin breakdown will have a volume reduction of 50% by week 8 Date Initiated: 06/16/2016 Target Resolution Date: 07/15/2016 Goal Status: Active Ulcer/skin breakdown will have a volume reduction of 80% by week 12 Date Initiated: 06/16/2016 Target Resolution Date: 07/15/2016 Goal Status: Active Ulcer/skin breakdown will heal within 14 weeks Date Initiated: 06/16/2016 Target Resolution Date: 07/15/2016 Goal Status: Active Interventions: Assess patient/caregiver ability to obtain necessary supplies Assess patient/caregiver ability to perform ulcer/skin care regimen upon admission and as needed Assess ulceration(s) every visit Notes: Electronic Signature(s) Signed: 08/31/2016 8:31:52 AM By: [name] [name], RN Entered By: [name] on 08/31/2016 08:31:51 Savard, [name] ([id number]) -------------------------------------------------------------------------------- Pain Assessment Details [place], Date of Service: 08/31/2016 8:15 AM Patient Name: [name] Patient Account Number: [id number] Medical Record Treating RN: Baruch Gouty RN, [name], Velva Harman [id number] Number: Other Clinician:  Date of Birth/Sex: Apr 27, 1951 (66 y.o. Male) Treating [name], [place] Primary Care Deryck Hippler: Royetta Crochet Dene Nazir/Extender: G Referring Soyla Bainter: Doristine Locks in Treatment: 10 Active Problems Location of Pain Severity and Description of Pain Patient Has Paino No Site Locations With Dressing Change: No Pain Management and Medication Current Pain Management: Electronic Signature(s) Signed: 08/31/2016 8:12:39 AM By: [name] [name], RN Entered By: [name] on 08/31/2016 08:12:39 Ivins, [name] ([id number]) -------------------------------------------------------------------------------- Patient/Caregiver Education Details Stefan, Date of Service: 08/31/2016 8:15 AM Patient Name: [name] Patient Account Number: [id number] Medical Record Treating RN: Baruch Gouty, RN, [name], Velva Harman [id number] Number: Other Clinician: Date of Birth/Gender: 08-27-1950 (66 y.o. Male) Treating [name], MICHAEL Primary Care Physician/Extender: Tonye Pearson Physician: Suella Grove in Treatment: 10 Referring Physician: Royetta Crochet Education Assessment Education Provided To: Patient Education Topics Provided Welcome To The [facility]: Methods: Explain/Verbal Responses: State content correctly Electronic Signature(s) Signed: 08/31/2016 4:51:03 PM By: [name] [name], RN Entered By: [name] on 08/31/2016 08:55:39 Barlowe, [name] ([id number]) -------------------------------------------------------------------------------- Wound Assessment Details Tippen, Date of Service: 08/31/2016 8:15 AM Patient Name: [name] Patient Account Number: [id number] Medical Record Treating RN: Baruch Gouty, RN, [name], Velva Harman [id number] Number: Other Clinician: Date of Birth/Sex: [birth year] (66 y.o. Male) Treating [name], MICHAEL Primary Care Demarus Latterell: Royetta Crochet Mayes Sangiovanni/Extender: G Referring Dazja Houchin: Doristine Locks in Treatment: 10 Wound Status Wound Number: 4 Primary Venous Leg Ulcer Etiology: Wound Location: Right Lower Leg - Medial Wound Status: Open Wounding Event: Gradually  Appeared Comorbid Arrhythmia, Hypertension, Gout, Date Acquired: 06/02/2016 History: Neuropathy Weeks Of Treatment: 10 Clustered Wound: No Photos Photo Uploaded By: [name] on 08/31/2016 17:04:51 Wound Measurements Length: (cm) 2.5 Width: (cm) 1.8 Depth: (cm) 0.2 Area: (cm) 3.534 Volume: (cm) 0.707 % Reduction in Area: -36.3% % Reduction in Volume: -173% Epithelialization: Small (1-33%) Tunneling: No Undermining: No Wound Description Classification: Partial Thickness Wound Margin: Flat and Intact Exudate Amount: Large Williamsen, Kimari ([id number]) Foul Odor After Cleansing: No Slough/Fibrino Yes Exudate Type: Serous Exudate Color: amber Wound Bed Granulation Amount: Medium (34-66%) Exposed Structure Granulation Quality: Red, Friable Fascia Exposed: No Necrotic Amount: Medium (34-66%) Fat Layer (Subcutaneous Tissue) Exposed: Yes Necrotic Quality: Adherent Slough Tendon Exposed: No Muscle Exposed: No Joint Exposed: No Bone Exposed: No Periwound Skin Texture Texture Color No Abnormalities Noted: No No Abnormalities Noted: No Callus: No Atrophie Blanche: No Crepitus: No Cyanosis: No Excoriation: No Ecchymosis: No Induration: No Erythema: No Rash: No Hemosiderin Staining: Yes Scarring: Yes Mottled: No Pallor: No Moisture Rubor: No No Abnormalities Noted: No Dry / Scaly: No Temperature / Pain Maceration: No Temperature: No Abnormality Tenderness on Palpation: Yes Wound Preparation Ulcer Cleansing: Rinsed/Irrigated with Saline Topical Anesthetic Applied: None, Other: Lidocaine 4%, Treatment Notes Wound #4 (Right, Medial Lower Leg) 1. Cleansed with: Cleanse wound with antibacterial soap and water 4. Dressing Applied: Other dressing (specify in notes) 5. Secondary Dressing Applied ABD Pad Kerlix/Conform 7. Secured with Tape Notes theraskin remains in place, [place] to Engineer, production) Signed: 08/31/2016 4:51:03 PM By: [name] [name], RN Pat, [place] ([id number]) Entered By: [name] on 08/31/2016 08:24:58 Hardman, [name] ([id number]) -------------------------------------------------------------------------------- Vitals Details [place], Date of Service: 08/31/2016 8:15 AM Patient Name: [name] Patient Account Number: [id number] Medical Record Treating RN: Baruch Gouty RN, [name], Velva Harman [id number] Number: Other Clinician: Date of Birth/Sex: 1950/10/01 (65 y.o. Male) Treating [name], MICHAEL Primary Care Lashaya Kienitz: Royetta Crochet Yanilen Adamik/Extender: G Referring Willett Lefeber: Doristine Locks in Treatment: 10 Vital Signs Time Taken: 08:14 Temperature (F): 97.3 Height (in): 74 Pulse (bpm): 66 Weight (lbs): 252 Respiratory  Rate (breaths/min): 18 Body Mass Index (BMI): 32.4 Blood Pressure (mmHg): 133/98 Reference Range: 80 - 120 mg / dl Electronic Signature(s) Signed: 08/31/2016 4:51:03 PM By: [name] [name], RN Entered By: [name] on 08/31/2016 08:20:05

## 2016-09-01 ENCOUNTER — Ambulatory Visit (INDEPENDENT_AMBULATORY_CARE_PROVIDER_SITE_OTHER): Payer: Medicare Other

## 2016-09-01 DIAGNOSIS — I83023 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: Secondary | ICD-10-CM

## 2016-09-01 DIAGNOSIS — L97309 Non-pressure chronic ulcer of unspecified ankle with unspecified severity: Secondary | ICD-10-CM

## 2016-09-01 DIAGNOSIS — L97329 Non-pressure chronic ulcer of left ankle with unspecified severity: Secondary | ICD-10-CM
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
## 2016-09-07 DIAGNOSIS — I87331 Chronic venous hypertension (idiopathic) with ulcer and inflammation of right lower extremity: Secondary | ICD-10-CM | POA: Diagnosis not present

## 2016-09-08 NOTE — Progress Notes (Signed)
LOURDES, MANNING ([id number]) Visit Report for 09/07/2016 Arrival Information Details [place], Date of Service: 09/07/2016 8:15 AM Patient Name: [name] Patient Account Number: [id number] Medical Record Treating RN: Baruch Gouty, RN, [name], Velva Harman [id number] Number: Other Clinician: Date of Birth/Sex: 03/15/1951 (66 y.o. Male) Treating [name] Primary Care Airika Alkhatib: Royetta Crochet Lashaunda Schild/Extender: G Referring Rainie Crenshaw: Doristine Locks in Treatment: 11 Visit Information History Since Last Visit All ordered tests and consults were completed: No Patient Arrived: Ambulatory Added or deleted any medications: No Arrival Time: 08:18 Any new allergies or adverse reactions: No Accompanied By: self Had a fall or experienced change in No Transfer Assistance: None activities of daily living that may affect Patient Identification Verified: Yes risk of falls: Secondary Verification Process Yes Signs or symptoms of abuse/neglect since last No Completed: visito Patient Requires Transmission- No Hospitalized since last visit: No Based Precautions: Has Dressing in Place as Prescribed: Yes Patient Has Alerts: Yes Has Compression in Place as Prescribed: Yes Patient Alerts: Patient on Blood Pain Present Now: No Thinner warfarin Electronic Signature(s) Signed: 09/07/2016 3:50:16 PM By: [name] [name], RN Entered By: [name] on 09/07/2016 08:18:51 Marquard, [name] ([id number]) -------------------------------------------------------------------------------- Encounter Discharge Information Details [place], Date of Service: 09/07/2016 8:15 AM Patient Name: [name] Patient Account Number: [id number] Medical Record Treating RN: Baruch Gouty RN, [name], Velva Harman [id number] Number: Other Clinician: Date of Birth/Sex: 1951-03-25 (65 y.o. Male) Treating [name], MICHAEL Primary Care Darl Kuss: Royetta Crochet Shanyn Preisler/Extender: G Referring Eh Sauseda: Doristine Locks in Treatment: 11 Encounter  Discharge Information Items Discharge Pain Level: 0 Discharge Condition: Stable Ambulatory Status: Ambulatory Discharge Destination: Home Transportation: Private Auto Accompanied By: self Schedule Follow-up Appointment: No Medication Reconciliation completed No and provided to Patient/Care Shonia Skilling: Patient Clinical Summary of Care: Declined Electronic Signature(s) Signed: 09/07/2016 10:41:06 AM By: Ruthine Dose Entered By: Ruthine Dose on 09/07/2016 10:41:05 Crumpler, [name] ([id number]) -------------------------------------------------------------------------------- Pain Assessment Details [place], Date of Service: 09/07/2016 8:15 AM Patient Name: [name] Patient Account Number: [id number] Medical Record Treating RN: Baruch Gouty RN, [name], Velva Harman [id number] Number: Other Clinician: Date of Birth/Sex: 1951-03-29 (66 y.o. Male) Treating [name] Primary Care Gadiel John: Royetta Crochet Emile Kyllo/Extender: G Referring Arnita Koons: Doristine Locks in Treatment: 11 Active Problems Location of Pain Severity and Description of Pain Patient Has Paino No Site Locations With Dressing Change: No Pain Management and Medication Current Pain Management: Electronic Signature(s) Signed: 09/07/2016 3:50:16 PM By: [name] [name], RN Entered By: [name] on 09/07/2016 08:18:57 [place]worth, [name] ([id number]) -------------------------------------------------------------------------------- Patient/Caregiver Education Details [place], Date of Service: 09/07/2016 8:15 AM Patient Name: [name] Patient Account Number: [id number] Medical Record Treating RN: Baruch Gouty RN, [name], Velva Harman [id number] Number: Other Clinician: Date of Birth/Gender: [birth year] (66 y.o. Male) Treating [name], MICHAEL Primary Care Physician/Extender: Tonye Pearson Physician: Weeks in Treatment: 11 Referring Physician: Royetta Crochet Education Assessment Education Provided To: Patient Education Topics  Provided Welcome To [facility]: Methods: Explain/Verbal Responses: State content correctly Electronic Signature(s) Signed: 09/07/2016 3:50:16 PM By: [name] [name], RN Entered By: [name] on 09/07/2016 08:42:45 Henneman, [name] ([id number]) -------------------------------------------------------------------------------- Wound Assessment Details [place], Date of Service: 09/07/2016 8:15 AM Patient Name: [name] Patient Account Number: [id number] Medical Record Treating RN: Baruch Gouty RN, [name], Velva Harman [id number] Number: Other Clinician: Date of Birth/Sex: [birth year] (66 y.o. Male) Treating [name] Primary Care Brently Voorhis: Royetta Crochet Charron Coultas/Extender: G Referring Emnet Monk: Royetta Crochet Weeks in Treatment: 11 Wound Status Wound Number: 4 Primary Venous Leg Ulcer Etiology: Wound Location: Right Lower Leg - Medial Wound Status: Open Wounding Event: Gradually Appeared Comorbid Arrhythmia, Hypertension, Gout, Date  Acquired: 06/02/2016 History: Neuropathy Weeks Of Treatment: 11 Clustered Wound: No Wound Measurements Length: (cm) 2.5 Width: (cm) 1.8 Depth: (cm) 0.2 Area: (cm) 3.534 Volume: (cm) 0.707 % Reduction in Area: -36.3% % Reduction in Volume: -173% Epithelialization: Small (1-33%) Tunneling: No Undermining: No Wound Description Classification: Partial Thickness Foul Odor Afte Wound Margin: Flat and Intact Slough/Fibrino Exudate Amount: Large Exudate Type: Serous Exudate Color: amber r Cleansing: No Yes Wound Bed Granulation Amount: Medium (34-66%) Exposed Structure Granulation Quality: Red, Friable Fascia Exposed: No Necrotic Amount: Medium (34-66%) Fat Layer (Subcutaneous Tissue) Exposed: Yes Necrotic Quality: Adherent Slough Tendon Exposed: No Muscle Exposed: No Joint Exposed: No Bone Exposed: No Periwound Skin Texture Texture Color No Abnormalities Noted: No No Abnormalities Noted: No Callus: No Atrophie Blanche:  No Crepitus: No Cyanosis: No Excoriation: No Ecchymosis: No Beckers, Mark ([id number]) Induration: No Erythema: No Rash: No Hemosiderin Staining: Yes Scarring: Yes Mottled: No Pallor: No Moisture Rubor: No No Abnormalities Noted: No Dry / Scaly: No Temperature / Pain Maceration: No Temperature: No Abnormality Tenderness on Palpation: Yes Wound Preparation Ulcer Cleansing: Rinsed/Irrigated with Saline Topical Anesthetic Applied: None Assessment Notes Measurement carried from last week. Patient has thera skin in place Treatment Notes Wound #4 (Right, Medial Lower Leg) 1. Cleansed with: Cleanse wound with antibacterial soap and water 5. Secondary Dressing Applied ABD Pad 7. Secured with 3 Layer Compression System - Right Lower Extremity Notes theraskin remains in place, unna to Engineer, production) Signed: 09/07/2016 3:50:16 PM By: [name] [name], RN Entered By: [name] on 09/07/2016 08:20:04

## 2016-09-14 ENCOUNTER — Other Ambulatory Visit
Admission: RE | Admit: 2016-09-14 | Discharge: 2016-09-14 | Disposition: A | Discharge status: Home / Self Care | Payer: Medicare Other | Source: Ambulatory Visit | Attending: Internal Medicine | Admitting: Internal Medicine

## 2016-09-14 ENCOUNTER — Encounter: Payer: Medicare Other | Attending: Internal Medicine | Admitting: Internal Medicine

## 2016-09-14 DIAGNOSIS — L97219 Non-pressure chronic ulcer of right calf with unspecified severity: Secondary | ICD-10-CM | POA: Insufficient documentation

## 2016-09-14 DIAGNOSIS — I890 Lymphedema, not elsewhere classified: Secondary | ICD-10-CM | POA: Diagnosis not present

## 2016-09-14 DIAGNOSIS — M109 Gout, unspecified: Secondary | ICD-10-CM | POA: Insufficient documentation

## 2016-09-14 DIAGNOSIS — I4891 Unspecified atrial fibrillation: Secondary | ICD-10-CM | POA: Insufficient documentation

## 2016-09-14 DIAGNOSIS — F17210 Nicotine dependence, cigarettes, uncomplicated: Secondary | ICD-10-CM | POA: Diagnosis not present

## 2016-09-14 DIAGNOSIS — I87331 Chronic venous hypertension (idiopathic) with ulcer and inflammation of right lower extremity: Secondary | ICD-10-CM | POA: Diagnosis present

## 2016-09-14 DIAGNOSIS — Z8673 Personal history of transient ischemic attack (TIA), and cerebral infarction without residual deficits: Secondary | ICD-10-CM | POA: Insufficient documentation

## 2016-09-14 DIAGNOSIS — Z7901 Long term (current) use of anticoagulants: Secondary | ICD-10-CM | POA: Insufficient documentation

## 2016-09-14 DIAGNOSIS — Z86718 Personal history of other venous thrombosis and embolism: Secondary | ICD-10-CM | POA: Insufficient documentation

## 2016-09-14 DIAGNOSIS — I10 Essential (primary) hypertension: Secondary | ICD-10-CM | POA: Insufficient documentation

## 2016-09-14 DIAGNOSIS — L97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: Secondary | ICD-10-CM | POA: Insufficient documentation

## 2016-09-14 NOTE — Progress Notes (Addendum)
GARRIE, WOODIN ([id number]) Visit Report for 09/14/2016 Arrival Information Details Skarzynski, Date of Service: 09/14/2016 8:15 AM Patient Name: [facility] Patient Account Number: [id number] Medical Record Treating RN: Baruch Gouty, RN, [name], Velva Harman [id number] Number: Other Clinician: Date of Birth/Sex: 08-Nov-1950 (66 y.o. Male) Treating [name] Primary Care Jahaira Earnhart: Royetta Crochet Bibiana Gillean/Extender: G Referring Josef Tourigny: Doristine Locks in Treatment: 12 Visit Information History Since Last Visit All ordered tests and consults were completed: No Patient Arrived: Ambulatory Added or deleted any medications: No Arrival Time: 08:10 Any new allergies or adverse reactions: No Accompanied By: self Had a fall or experienced change in No Transfer Assistance: None activities of daily living that may affect Patient Identification Verified: Yes risk of falls: Secondary Verification Process Yes Signs or symptoms of abuse/neglect since last No Completed: visito Patient Requires Transmission- No Hospitalized since last visit: No Based Precautions: Has Dressing in Place as Prescribed: Yes Patient Has Alerts: Yes Has Compression in Place as Prescribed: Yes Patient Alerts: Patient on Blood Pain Present Now: No Thinner warfarin Electronic Signature(s) Signed: 09/14/2016 8:10:18 AM By: [name] [name], RN Entered By: [name] on 09/14/2016 08:10:18 Farrior, [name] ([id number]) -------------------------------------------------------------------------------- Encounter Discharge Information Details Pelissier, Date of Service: 09/14/2016 8:15 AM Patient Name: [name] Patient Account Number: [id number] Medical Record Treating RN: Baruch Gouty, RN, [name], Velva Harman [id number] Number: Other Clinician: Date of Birth/Sex: 09/25/1950 (66 y.o. Male) Treating [name] Primary Care Carrell Palmatier: Royetta Crochet Deronte Solis/Extender: G Referring Olney Monier: Doristine Locks in Treatment: 12 Encounter  Discharge Information Items Discharge Pain Level: 0 Discharge Condition: Stable Ambulatory Status: Ambulatory Discharge Destination: Home Transportation: Private Auto Accompanied By: self Schedule Follow-up Appointment: No Medication Reconciliation completed No and provided to Patient/Care Gisella Alwine: Patient Clinical Summary of Care: Declined Electronic Signature(s) Signed: 09/14/2016 8:53:29 AM By: Ruthine Dose Entered By: Ruthine Dose on 09/14/2016 08:53:29 Moening, [name] ([id number]) -------------------------------------------------------------------------------- Lower Extremity Assessment Details Dingus, Date of Service: 09/14/2016 8:15 AM Patient Name: [name] Patient Account Number: [id number] Medical Record Treating RN: Baruch Gouty, RN, [name], Velva Harman [id number] Number: Other Clinician: Date of Birth/Sex: July 04, 1950 (66 y.o. Male) Treating [name], [place] Primary Care Brittain Smithey: Royetta Crochet Myrtha Tonkovich/Extender: G Referring Arvie Bartholomew: Royetta Crochet Weeks in Treatment: 12 Edema Assessment Assessed: [Left: No] [Right: No] E[Left: dema] [Right: :] Calf Left: Right: Point of Measurement: 38 cm From Medial Instep cm 38.5 cm Ankle Left: Right: Point of Measurement: 12 cm From Medial Instep cm 27.2 cm Vascular Assessment Claudication: Claudication Assessment [Right:None] Pulses: Dorsalis Pedis Palpable: [Right:Yes] Posterior Tibial Extremity colors, hair growth, and conditions: Extremity Color: [Right:Dusky] Hair Growth on Extremity: [Right:Yes] Temperature of Extremity: [Right:Warm] Capillary Refill: [Right:< 3 seconds] Toe Nail Assessment Left: Right: Thick: Yes Discolored: Yes Deformed: Yes Improper Length and Hygiene: Yes Electronic Signature(s) Signed: 09/14/2016 4:25:14 PM By: [name] [name], RN [place], [place] ([id number]) Entered By: [name] on 09/14/2016 08:25:26 Ruud, [name]  ([id number]) -------------------------------------------------------------------------------- Multi Wound Chart Details Netzel, Date of Service: 09/14/2016 8:15 AM Patient Name: [name] Patient Account Number: [id number] Medical Record Treating RN: Baruch Gouty RN, [name], Velva Harman [id number] Number: Other Clinician: Date of Birth/Sex: 1950-06-14 (65 y.o. Male) Treating [name] Primary Care Varshini Arrants: Royetta Crochet Kolston Lacount/Extender: G Referring Khyree Carillo: Royetta Crochet Weeks in Treatment: 12 Vital Signs Height(in): 74 Pulse(bpm): 59 Weight(lbs): 252 Blood Pressure 166/99 (mmHg): Body Mass Index(BMI): 32 Temperature(F): 98 Respiratory Rate 19 (breaths/min): Photos: [4:No Photos] [N/A:N/A] Wound Location: [4:Right Lower Leg - Medial N/A] Wounding Event: [4:Gradually Appeared] [N/A:N/A] Primary Etiology: [4:Venous Leg Ulcer] [N/A:N/A] Comorbid History: [4:Arrhythmia, Hypertension, N/A Gout, Neuropathy] Date Acquired: [4:06/02/2016] [N/A:N/A] Weeks  of Treatment: [4:12] [N/A:N/A] Wound Status: [4:Open] [N/A:N/A] Measurements L x W x D 2.8x1.8x0.2 [N/A:N/A] (cm) Area (cm) : [4:3.958] [N/A:N/A] Volume (cm) : [4:0.792] [N/A:N/A] % Reduction in Area: [4:-52.70%] [N/A:N/A] % Reduction in Volume: -205.80% [N/A:N/A] Classification: [4:Partial Thickness] [N/A:N/A] Exudate Amount: [4:Large] [N/A:N/A] Exudate Type: [4:Serosanguineous] [N/A:N/A] Exudate Color: [4:red, brown] [N/A:N/A] Foul Odor After [4:Yes] [N/A:N/A] Cleansing: Odor Anticipated Due to No [N/A:N/A] Product Use: Wound Margin: [4:Flat and Intact] [N/A:N/A] Granulation Amount: [4:Small (1-33%)] [N/A:N/A] Granulation Quality: [4:Pink, Pale] [N/A:N/A] Necrotic Amount: [4:Large (67-100%)] [N/A:N/A] Exposed Structures: [N/A:N/A] Fat Layer (Subcutaneous Tissue) Exposed: Yes Fascia: No Tendon: No Muscle: No Joint: No Bone: No Epithelialization: Small (1-33%) N/A N/A Debridement: Debridement (37106- N/A  N/A 11047) Pre-procedure 08:32 N/A N/A Verification/Time Out Taken: Pain Control: Lidocaine 4% Topical N/A N/A Solution Tissue Debrided: Fibrin/Slough, N/A N/A Subcutaneous Level: Skin/Subcutaneous N/A N/A Tissue Debridement Area (sq 5.04 N/A N/A cm): Instrument: Curette N/A N/A Specimen: Swab N/A N/A Number of Specimens 1 N/A N/A Taken: Bleeding: Minimum N/A N/A Hemostasis Achieved: Pressure N/A N/A Procedural Pain: 0 N/A N/A Post Procedural Pain: 0 N/A N/A Debridement Treatment Procedure was tolerated N/A N/A Response: well Post Debridement 2.8x1.8x0.2 N/A N/A Measurements L x W x D (cm) Post Debridement 0.792 N/A N/A Volume: (cm) Periwound Skin Texture: Scarring: Yes N/A N/A Excoriation: No Induration: No Callus: No Crepitus: No Rash: No Periwound Skin Maceration: Yes N/A N/A Moisture: Dry/Scaly: No Periwound Skin Color: Hemosiderin Staining: Yes N/A N/A Atrophie Blanche: No Cyanosis: No Ecchymosis: No Erythema: No Mottled: No Putt, Anquan ([id number]) Pallor: No Rubor: No Temperature: No Abnormality N/A N/A Tenderness on Yes N/A N/A Palpation: Wound Preparation: Ulcer Cleansing: N/A N/A Rinsed/Irrigated with Saline, Other: surg scrub and water Topical Anesthetic Applied: None Procedures Performed: Debridement N/A N/A Treatment Notes Electronic Signature(s) Signed: 09/15/2016 7:53:49 AM By: [name] MD Entered By: [name] on 09/14/2016 08:43:13 Herbst, [name] ([id number]) -------------------------------------------------------------------------------- Multi-Disciplinary Care Plan Details Corwin, Date of Service: 09/14/2016 8:15 AM Patient Name: [name] Patient Account Number: [id number] Medical Record Treating RN: Baruch Gouty RN, [name], Velva Harman [id number] Number: Other Clinician: Date of Birth/Sex: 11/27/1950 (66 y.o. Male) Treating [name] Primary Care Monicia Tse: Royetta Crochet Gabrianna Fassnacht/Extender: G Referring Takeela Peil:  Doristine Locks in Treatment: 12 Active Inactive ` Orientation to the Wound Care Program Nursing Diagnoses: Knowledge deficit related to the wound healing center program Goals: Patient/caregiver will verbalize understanding of the [facility] Date Initiated: 06/16/2016 Target Resolution Date: 08/15/2016 Goal Status: Active Interventions: Provide education on orientation to the wound center Notes: ` Venous Leg Ulcer Nursing Diagnoses: Potential for venous Insuffiency (use before diagnosis confirmed) Goals: Patient will maintain optimal edema control Date Initiated: 06/16/2016 Target Resolution Date: 08/19/2016 Goal Status: Active Interventions: Compression as ordered Notes: ` Wound/Skin Impairment Nursing Diagnoses: OBE, AHLERS ([id number]) Impaired tissue integrity Goals: Patient/caregiver will verbalize understanding of skin care regimen Date Initiated: 06/16/2016 Target Resolution Date: 07/15/2016 Goal Status: Active Ulcer/skin breakdown will have a volume reduction of 30% by week 4 Date Initiated: 06/16/2016 Target Resolution Date: 07/15/2016 Goal Status: Active Ulcer/skin breakdown will have a volume reduction of 50% by week 8 Date Initiated: 06/16/2016 Target Resolution Date: 07/15/2016 Goal Status: Active Ulcer/skin breakdown will have a volume reduction of 80% by week 12 Date Initiated: 06/16/2016 Target Resolution Date: 07/15/2016 Goal Status: Active Ulcer/skin breakdown will heal within 14 weeks Date Initiated: 06/16/2016 Target Resolution Date: 07/15/2016 Goal Status: Active Interventions: Assess patient/caregiver ability to obtain necessary supplies Assess patient/caregiver ability to perform ulcer/skin care regimen upon  admission and as needed Assess ulceration(s) every visit Notes: Electronic Signature(s) Signed: 09/14/2016 4:25:14 PM By: [name] [name], RN Entered By: [name] on 09/14/2016 08:32:41 Sarsfield, [name]  ([id number]) -------------------------------------------------------------------------------- Pain Assessment Details Fackrell, Date of Service: 09/14/2016 8:15 AM Patient Name: [name] Patient Account Number: [id number] Medical Record Treating RN: Baruch Gouty RN, [name], Velva Harman [id number] Number: Other Clinician: Date of Birth/Sex: 01-07-1951 (66 y.o. Male) Treating [name] Primary Care Keivon Garden: Royetta Crochet Ellah Otte/Extender: G Referring Shanora Christensen: Doristine Locks in Treatment: 12 Active Problems Location of Pain Severity and Description of Pain Patient Has Paino No Site Locations With Dressing Change: No Pain Management and Medication Current Pain Management: Electronic Signature(s) Signed: 09/14/2016 4:25:14 PM By: [name] [name], RN Entered By: [name] on 09/14/2016 08:21:06 Grimme, [name] ([id number]) -------------------------------------------------------------------------------- Patient/Caregiver Education Details Bertholf, Date of Service: 09/14/2016 8:15 AM Patient Name: [name] Patient Account Number: [id number] Medical Record Treating RN: Baruch Gouty RN, [name], Velva Harman [id number] Number: Other Clinician: Date of Birth/Gender: 12-Dec-1950 (66 y.o. Male) Treating [name], MICHAEL Primary Care Physician/Extender: Tonye Pearson Physician: Suella Grove in Treatment: 12 Referring Physician: Royetta Crochet Education Assessment Education Provided To: Patient Education Topics Provided Basic Hygiene: Methods: Explain/Verbal Responses: State content correctly Welcome To [facility]: Methods: Explain/Verbal Responses: State content correctly Wound Debridement: Methods: Explain/Verbal Responses: State content correctly Wound/Skin Impairment: Methods: Explain/Verbal Responses: State content correctly Electronic Signature(s) Signed: 09/14/2016 4:25:14 PM By: [name] [name], RN Entered By: [name] on 09/14/2016 08:53:11 Moncus, [name]  ([id number]) -------------------------------------------------------------------------------- Wound Assessment Details Poulton, Date of Service: 09/14/2016 8:15 AM Patient Name: [name] Patient Account Number: [id number] Medical Record Treating RN: Baruch Gouty, RN, [name], Velva Harman [id number] Number: Other Clinician: Date of Birth/Sex: [birth year] (66 y.o. Male) Treating [name] Primary Care Jahlia Omura: Royetta Crochet Penne Rosenstock/Extender: G Referring Reign Dziuba: Doristine Locks in Treatment: 12 Wound Status Wound Number: 4 Primary Venous Leg Ulcer Etiology: Wound Location: Right Lower Leg - Medial Wound Status: Open Wounding Event: Gradually Appeared Comorbid Arrhythmia, Hypertension, Gout, Date Acquired: 06/02/2016 History: Neuropathy Weeks Of Treatment: 12 Clustered Wound: No Photos Photo Uploaded By: [name] on 09/14/2016 16:29:03 Wound Measurements Length: (cm) 2.8 Width: (cm) 1.8 Depth: (cm) 0.2 Area: (cm) 3.958 Volume: (cm) 0.792 % Reduction in Area: -52.7% % Reduction in Volume: -205.8% Epithelialization: Small (1-33%) Tunneling: No Undermining: No Wound Description Classification: Partial Thickness Wound Margin: Flat and Intact Exudate Amount: Large Kittler, Badr ([id number]) Foul Odor After Cleansing: Yes Due to Product Use: No Slough/Fibrino Yes Exudate Type: Serosanguineous Exudate Color: red, brown Wound Bed Granulation Amount: Small (1-33%) Exposed Structure Granulation Quality: Pink, Pale Fascia Exposed: No Necrotic Amount: Large (67-100%) Fat Layer (Subcutaneous Tissue) Exposed: Yes Necrotic Quality: Adherent Slough Tendon Exposed: No Muscle Exposed: No Joint Exposed: No Bone Exposed: No Periwound Skin Texture Texture Color No Abnormalities Noted: No No Abnormalities Noted: No Callus: No Atrophie Blanche: No Crepitus: No Cyanosis: No Excoriation: No Ecchymosis: No Induration: No Erythema: No Rash: No Hemosiderin Staining:  Yes Scarring: Yes Mottled: No Pallor: No Moisture Rubor: No No Abnormalities Noted: No Dry / Scaly: No Temperature / Pain Maceration: Yes Temperature: No Abnormality Tenderness on Palpation: Yes Wound Preparation Ulcer Cleansing: Rinsed/Irrigated with Saline, Other: surg scrub and water, Topical Anesthetic Applied: None Treatment Notes Wound #4 (Right, Medial Lower Leg) 1. Cleansed with: Cleanse wound with antibacterial soap and water 3. Peri-wound Care: Barrier cream 4. Dressing Applied: Aquacel Ag 5. Secondary Dressing Applied ABD Pad 7. Secured with 3 Layer Compression System - Right Lower Extremity Notes unna to anchor  PREM, COYKENDALL ([id number]) Electronic Signature(s) Signed: 09/14/2016 4:25:14 PM By: [name] [name], RN Entered By: [name] on 09/14/2016 08:24:01 Berti, [name] ([id number]) -------------------------------------------------------------------------------- Vitals Details Gramajo, Date of Service: 09/14/2016 8:15 AM Patient Name: [name] Patient Account Number: [id number] Medical Record Treating RN: Baruch Gouty, RN, [name], Velva Harman [id number] Number: Other Clinician: Date of Birth/Sex: 1951-03-09 (65 y.o. Male) Treating [name], [place] Primary Care Cailean Heacock: Royetta Crochet Cross Jorge/Extender: G Referring Reynald Woods: Doristine Locks in Treatment: 12 Vital Signs Time Taken: 08:22 Temperature (F): 98 Height (in): 74 Pulse (bpm): 59 Weight (lbs): 252 Respiratory Rate (breaths/min): 19 Body Mass Index (BMI): 32.4 Blood Pressure (mmHg): 166/99 Reference Range: 80 - 120 mg / dl Electronic Signature(s) Signed: 09/14/2016 4:25:14 PM By: [name] [name], RN Entered By: [name] on 09/14/2016 08:24:49

## 2016-09-16 LAB — AEROBIC CULTURE W GRAM STAIN (SUPERFICIAL SPECIMEN)

## 2016-09-16 LAB — AEROBIC CULTURE  (SUPERFICIAL SPECIMEN)
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
## 2016-09-21 ENCOUNTER — Encounter: Payer: Medicare Other | Admitting: Internal Medicine

## 2016-09-21 DIAGNOSIS — I87331 Chronic venous hypertension (idiopathic) with ulcer and inflammation of right lower extremity: Secondary | ICD-10-CM | POA: Diagnosis not present
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
## 2016-09-24 ENCOUNTER — Ambulatory Visit (INDEPENDENT_AMBULATORY_CARE_PROVIDER_SITE_OTHER): Payer: Medicare Other | Admitting: Vascular Surgery

## 2016-09-24 ENCOUNTER — Encounter (INDEPENDENT_AMBULATORY_CARE_PROVIDER_SITE_OTHER): Payer: Self-pay | Admitting: Vascular Surgery

## 2016-09-24 VITALS — BP 156/97 | HR 62 | Resp 16 | Wt 253.0 lb

## 2016-09-24 DIAGNOSIS — E119 Type 2 diabetes mellitus without complications: Secondary | ICD-10-CM

## 2016-09-24 DIAGNOSIS — L97309 Non-pressure chronic ulcer of unspecified ankle with unspecified severity: Secondary | ICD-10-CM | POA: Diagnosis not present

## 2016-09-24 DIAGNOSIS — I83023 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: Secondary | ICD-10-CM | POA: Diagnosis not present

## 2016-09-24 DIAGNOSIS — I10 Essential (primary) hypertension: Secondary | ICD-10-CM

## 2016-09-24 DIAGNOSIS — L97329 Non-pressure chronic ulcer of left ankle with unspecified severity: Secondary | ICD-10-CM

## 2016-09-24 NOTE — Assessment & Plan Note (Signed)
blood glucose control important in reducing the progression of atherosclerotic disease. Also, involved in wound healing. On appropriate medications.  

## 2016-09-24 NOTE — Progress Notes (Signed)
CONSTANTIN, HILLERY ([id number]) Visit Report for 09/21/2016 Arrival Information Details [name], Date of Service: 09/21/2016 8:15 AM Patient Name: [name] Patient Account Number: [id number] Medical Record Treating RN: Baruch Gouty, RN, [name], Velva Harman [id number] Number: Other Clinician: Date of Birth/Sex: 12-Nov-1950 (66 y.o. Male) Treating [name] Primary Care Kadir Azucena: [name] Shella Lahman/Extender: G Referring [name]: [name] in Treatment: 13 Visit Information History Since Last Visit All ordered tests and consults were completed: No Patient Arrived: Ambulatory Added or deleted any medications: No Arrival Time: 08:13 Any new allergies or adverse reactions: No Accompanied By: self Had a fall or experienced change in No Transfer Assistance: None activities of daily living that may affect Patient Identification Verified: Yes risk of falls: Secondary Verification Process Yes Signs or symptoms of abuse/neglect since last No Completed: visito Patient Requires Transmission- No Hospitalized since last visit: No Based Precautions: Has Dressing in Place as Prescribed: Yes Patient Has Alerts: Yes Has Compression in Place as Prescribed: Yes Patient Alerts: Patient on Blood Pain Present Now: No Thinner warfarin Electronic Signature(s) Signed: 09/21/2016 5:27:57 PM By: [name] [name], RN Entered By: [name] on 09/21/2016 08:17:25 [name], [name] ([id number]) -------------------------------------------------------------------------------- Complex / Palliative Patient Assessment Details [name], Date of Service: 09/21/2016 8:15 AM Patient Name: [name] Patient Account Number: [id number] Medical Record Treating RN: Cornell Barman [id number] Number: Other Clinician: Date of Birth/Sex: [birth year] (66 y.o. Male) Treating [name] Primary Care Josselin Gaulin: [name] Dalya Maselli/Extender: G Referring Eaden Hettinger: [name] in Treatment: 13 Palliative  Management Criteria Complex Wound Management Criteria Patient requires a surgical procedure in order to achieve wound healing: Ablation unsuccessful in Mark saphenous However, the surgeon has determined that the patient is not a surgical candidate due to medical status. Care Approach Wound Care Plan: Complex Wound Management Electronic Signature(s) Signed: 09/22/2016 2:07:28 PM By: Gretta Cool, RN, [name], [name], [name] Signed: 09/22/2016 5:27:22 PM By: [name] MD Entered By: Gretta Cool, RN, [name], Kim on 09/22/2016 14:07:28 [name], [name] ([id number]) -------------------------------------------------------------------------------- Encounter Discharge Information Details [name], Date of Service: 09/21/2016 8:15 AM Patient Name: [name] Patient Account Number: [id number] Medical Record Treating RN: Baruch Gouty RN, [name], Velva Harman [id number] Number: Other Clinician: Date of Birth/Sex: 10/17/1950 (66 y.o. Male) Treating [name], [place] Primary Care Consuella Scurlock: [name] Mychael Soots/Extender: G Referring Jeovanny Cuadros: [name] in Treatment: 13 Encounter Discharge Information Items Discharge Pain Level: 0 Discharge Condition: Stable Ambulatory Status: Ambulatory Discharge Destination: Home Transportation: Private Auto Accompanied By: self Schedule Follow-up Appointment: No Medication Reconciliation completed No and provided to Patient/Care Edda Orea: Patient Clinical Summary of Care: Declined Electronic Signature(s) Signed: 09/21/2016 5:27:57 PM By: [name] [name], RN Previous Signature: 09/21/2016 8:48:03 AM Version By: Ruthine Dose Entered By: [name] on 09/21/2016 08:49:17 [name], [name] ([id number]) -------------------------------------------------------------------------------- Lower Extremity Assessment Details [name], Date of Service: 09/21/2016 8:15 AM Patient Name: [name] Patient Account Number: [id number] Medical Record Treating RN: Baruch Gouty RN, [name],  Velva Harman [id number] Number: Other Clinician: Date of Birth/Sex: [birth year] (66 y.o. Male) Treating [name], [name] Primary Care Talishia Betzler: [name] Ladarrious Kirksey/Extender: G Referring Bridgid Printz: [name] in Treatment: 13 Edema Assessment Assessed: [Left: No] [Right: No] E[Left: dema] [Right: :] Calf Left: Right: Point of Measurement: 38 cm From Medial Instep cm 44 cm Ankle Left: Right: Point of Measurement: 12 cm From Medial Instep cm 28.5 cm Vascular Assessment Claudication: Claudication Assessment [Right:None] Pulses: Dorsalis Pedis Palpable: [Right:Yes] Posterior Tibial Extremity colors, hair growth, and conditions: Extremity Color: [Right:Hyperpigmented] Hair Growth on Extremity: [Right:Yes] Temperature of Extremity: [Right:Warm] Capillary Refill: [Right:< 3 seconds] Toe Nail Assessment Left: Right:  Thick: Yes Discolored: Yes Deformed: Yes Improper Length and Hygiene: Yes Electronic Signature(s) Signed: 09/21/2016 5:27:57 PM By: [name] [name], RN [name], [name] ([id number]) Entered By: [name] on 09/21/2016 08:22:04 [name], [name] ([id number]) -------------------------------------------------------------------------------- Multi Wound Chart Details [name], Date of Service: 09/21/2016 8:15 AM Patient Name: [name] Patient Account Number: [id number] Medical Record Treating RN: Baruch Gouty RN, [name], Velva Harman [id number] Number: Other Clinician: Date of Birth/Sex: [birth year] (65 y.o. Male) Treating [name], [name] Primary Care Leonna Schlee: [name] Hilda Wexler/Extender: G Referring Yuriy Cui: [name] in Treatment: 13 Vital Signs Height(in): 74 Pulse(bpm): 66 Weight(lbs): 252 Blood Pressure 156/78 (mmHg): Body Mass Index(BMI): 32 Temperature(F): 97.8 Respiratory Rate 19 (breaths/min): Photos: [4:No Photos] [N/A:N/A] Wound Location: [4:Right Lower Leg - Medial N/A] Wounding Event: [4:Gradually Appeared] [N/A:N/A] Primary Etiology:  [4:Venous Leg Ulcer] [N/A:N/A] Comorbid History: [4:Arrhythmia, Hypertension, N/A Gout, Neuropathy] Date Acquired: [4:06/02/2016] [N/A:N/A] Weeks of Treatment: [4:13] [N/A:N/A] Wound Status: [4:Open] [N/A:N/A] Measurements L x W x D 2.6x2x0.2 [N/A:N/A] (cm) Area (cm) : [4:4.084] [N/A:N/A] Volume (cm) : [4:0.817] [N/A:N/A] % Reduction in Area: [4:-57.60%] [N/A:N/A] % Reduction in Volume: -215.40% [N/A:N/A] Classification: [4:Partial Thickness] [N/A:N/A] Exudate Amount: [4:Large] [N/A:N/A] Exudate Type: [4:Serosanguineous] [N/A:N/A] Exudate Color: [4:red, brown] [N/A:N/A] Wound Margin: [4:Flat and Intact] [N/A:N/A] Granulation Amount: [4:Small (1-33%)] [N/A:N/A] Granulation Quality: [4:Pink, Pale] [N/A:N/A] Necrotic Amount: [4:Large (67-100%)] [N/A:N/A] Exposed Structures: [4:Fat Layer (Subcutaneous N/A Tissue) Exposed: Yes Fascia: No Tendon: No Muscle: No] Joint: No Bone: No Epithelialization: Small (1-33%) N/A N/A Debridement: Debridement (08657- N/A N/A 11047) Pre-procedure 08:30 N/A N/A Verification/Time Out Taken: Pain Control: Lidocaine 4% Topical N/A N/A Solution Tissue Debrided: Fibrin/Slough, Fat, N/A N/A Exudates, Subcutaneous Level: Skin/Subcutaneous N/A N/A Tissue Debridement Area (sq 5.2 N/A N/A cm): Instrument: Curette N/A N/A Bleeding: Minimum N/A N/A Hemostasis Achieved: Pressure N/A N/A Procedural Pain: 0 N/A N/A Post Procedural Pain: 0 N/A N/A Debridement Treatment Procedure was tolerated N/A N/A Response: well Post Debridement 2.6x2x0.2 N/A N/A Measurements L x W x D (cm) Post Debridement 0.817 N/A N/A Volume: (cm) Periwound Skin Texture: Scarring: Yes N/A N/A Excoriation: No Induration: No Callus: No Crepitus: No Rash: No Periwound Skin Maceration: No N/A N/A Moisture: Dry/Scaly: No Periwound Skin Color: Hemosiderin Staining: Yes N/A N/A Atrophie Blanche: No Cyanosis: No Ecchymosis: No Erythema: No Mottled: No Pallor: No Rubor:  No Temperature: No Abnormality N/A N/A Tenderness on Yes N/A N/A Palpation: Wound Preparation: Ulcer Cleansing: N/A N/A Rinsed/Irrigated with Saline, Other: surg scrub Odonovan, Gery ([id number]) and water Topical Anesthetic Applied: Other: lidocaine 4% Procedures Performed: Debridement N/A N/A Treatment Notes Electronic Signature(s) Signed: 09/22/2016 12:30:45 PM By: [name] MD Entered By: [name] on 09/21/2016 08:48:18 Pizzuto, [name] ([id number]) -------------------------------------------------------------------------------- Multi-Disciplinary Care Plan Details [place], Date of Service: 09/21/2016 8:15 AM Patient Name: [name] Patient Account Number: [id number] Medical Record Treating RN: Baruch Gouty RN, [name], Velva Harman [id number] Number: Other Clinician: Date of Birth/Sex: 1951-03-25 (66 y.o. Male) Treating [name], [name] Primary Care Lafonda Patron: [name] Tyreak Reagle/Extender: G Referring Amos Gaber: [name] in Treatment: 13 Active Inactive ` Orientation to the Wound Care Program Nursing Diagnoses: Knowledge deficit related to the wound healing center program Goals: Patient/caregiver will verbalize understanding of the [facility] Program Date Initiated: 06/16/2016 Target Resolution Date: 08/15/2016 Goal Status: Active Interventions: Provide education on orientation to the wound center Notes: ` Venous Leg Ulcer Nursing Diagnoses: Potential for venous Insuffiency (use before diagnosis confirmed) Goals: Patient will maintain optimal edema control Date Initiated: 06/16/2016 Target Resolution Date: 08/19/2016 Goal Status: Active Interventions: Compression as ordered Notes: ` Wound/Skin Impairment  Nursing Diagnoses: KEATEN, MASHEK ([id number]) Impaired tissue integrity Goals: Patient/caregiver will verbalize understanding of skin care regimen Date Initiated: 06/16/2016 Target Resolution Date: 07/15/2016 Goal Status:  Active Ulcer/skin breakdown will have a volume reduction of 30% by week 4 Date Initiated: 06/16/2016 Target Resolution Date: 07/15/2016 Goal Status: Active Ulcer/skin breakdown will have a volume reduction of 50% by week 8 Date Initiated: 06/16/2016 Target Resolution Date: 07/15/2016 Goal Status: Active Ulcer/skin breakdown will have a volume reduction of 80% by week 12 Date Initiated: 06/16/2016 Target Resolution Date: 07/15/2016 Goal Status: Active Ulcer/skin breakdown will heal within 14 weeks Date Initiated: 06/16/2016 Target Resolution Date: 07/15/2016 Goal Status: Active Interventions: Assess patient/caregiver ability to obtain necessary supplies Assess patient/caregiver ability to perform ulcer/skin care regimen upon admission and as needed Assess ulceration(s) every visit Notes: Electronic Signature(s) Signed: 09/21/2016 5:27:57 PM By: [name] [name], RN Entered By: [name] on 09/21/2016 08:31:38 Selley, [name] ([id number]) -------------------------------------------------------------------------------- Pain Assessment Details [place], Date of Service: 09/21/2016 8:15 AM Patient Name: [name] Patient Account Number: [id number] Medical Record Treating RN: Baruch Gouty, RN, [name], Velva Harman [id number] Number: Other Clinician: Date of Birth/Sex: [birth year] (66 y.o. Male) Treating [name] Primary Care Jehiel Koepp: [name] Eren Puebla/Extender: G Referring Buford Gayler: [name] in Treatment: 13 Active Problems Location of Pain Severity and Description of Pain Patient Has Paino No Site Locations With Dressing Change: No Pain Management and Medication Current Pain Management: Electronic Signature(s) Signed: 09/21/2016 5:27:57 PM By: [name] [name], RN Entered By: [name] on 09/21/2016 08:17:31 Lemler, [name] ([id number]) -------------------------------------------------------------------------------- Patient/Caregiver Education Details Giovanni, Date of  Service: 09/21/2016 8:15 AM Patient Name: [name] Patient Account Number: [id number] Medical Record Treating RN: Baruch Gouty, RN, [name], Velva Harman [id number] Number: Other Clinician: Date of Birth/Gender: 10-Aug-1950 (66 y.o. Male) Treating [name], [name] Primary Care Physician/Extender: Tonye Pearson Physician: Weeks in Treatment: 13 Referring Physician: Royetta [name] Education Assessment Education Provided To: Patient Education Topics Provided Welcome To [facility]: Methods: Explain/Verbal Responses: State content correctly Wound Debridement: Methods: Explain/Verbal Responses: State content correctly Wound/Skin Impairment: Methods: Explain/Verbal Responses: State content correctly Electronic Signature(s) Signed: 09/21/2016 5:27:57 PM By: [name] [name], RN Entered By: [name] on 09/21/2016 08:49:34 Bushee, [name] ([id number]) -------------------------------------------------------------------------------- Wound Assessment Details [place], Date of Service: 09/21/2016 8:15 AM Patient Name: [name] Patient Account Number: [id number] Medical Record Treating RN: Baruch Gouty, RN, [name], Velva Harman [id number] Number: Other Clinician: Date of Birth/Sex: November 19, 1950 (66 y.o. Male) Treating [name], [place] Primary Care Nelson Julson: [name] Kaye Mitro/Extender: G Referring Torian Quintero: [name] Weeks in Treatment: 13 Wound Status Wound Number: 4 Primary Venous Leg Ulcer Etiology: Wound Location: Right Lower Leg - Medial Wound Status: Open Wounding Event: Gradually Appeared Comorbid Arrhythmia, Hypertension, Gout, Date Acquired: 06/02/2016 History: Neuropathy Weeks Of Treatment: 13 Clustered Wound: No Photos Photo Uploaded By: [name] on 09/21/2016 17:22:45 Wound Measurements Length: (cm) 2.6 Width: (cm) 2 Depth: (cm) 0.2 Area: (cm) 4.084 Volume: (cm) 0.817 % Reduction in Area: -57.6% % Reduction in Volume: -215.4% Epithelialization: Small (1-33%) Tunneling:  No Undermining: No Wound Description Classification: Partial Thickness Wound Margin: Flat and Intact Exudate Amount: Large Ciaramitaro, Deondray ([id number]) Foul Odor After Cleansing: No Slough/Fibrino Yes Exudate Type: Serosanguineous Exudate Color: red, brown Wound Bed Granulation Amount: Small (1-33%) Exposed Structure Granulation Quality: Pink, Pale Fascia Exposed: No Necrotic Amount: Large (67-100%) Fat Layer (Subcutaneous Tissue) Exposed: Yes Necrotic Quality: Adherent Slough Tendon Exposed: No Muscle Exposed: No Joint Exposed: No Bone Exposed: No Periwound Skin Texture Texture Color No Abnormalities Noted: No No Abnormalities Noted: No Callus: No  Atrophie Blanche: No Crepitus: No Cyanosis: No Excoriation: No Ecchymosis: No Induration: No Erythema: No Rash: No Hemosiderin Staining: Yes Scarring: Yes Mottled: No Pallor: No Moisture Rubor: No No Abnormalities Noted: No Dry / Scaly: No Temperature / Pain Maceration: No Temperature: No Abnormality Tenderness on Palpation: Yes Wound Preparation Ulcer Cleansing: Rinsed/Irrigated with Saline, Other: surg scrub and water, Topical Anesthetic Applied: Other: lidocaine 4%, Treatment Notes Wound #4 (Right, Medial Lower Leg) 1. Cleansed with: Cleanse wound with antibacterial soap and water 3. Peri-wound Care: Barrier cream Moisturizing lotion 4. Dressing Applied: Aquacel Ag 5. Secondary Dressing Applied Dry Gauze 7. Secured with 3 Layer Compression System - Right Lower Extremity Notes unna to anchor CORTLANDT, CAPUANO ([id number]) Electronic Signature(s) Signed: 09/21/2016 5:27:57 PM By: [name] [name], RN Entered By: [name] on 09/21/2016 08:28:33 Spanos, [name] ([id number]) -------------------------------------------------------------------------------- Vitals Details [place], Date of Service: 09/21/2016 8:15 AM Patient Name: [name] Patient Account Number: [id number] Medical Record Treating  RN: Baruch Gouty, RN, [name], Velva Harman [id number] Number: Other Clinician: Date of Birth/Sex: Feb 26, 1951 (65 y.o. Male) Treating [name], [place] Primary Care Karon Heckendorn: [name] Davanna He/Extender: G Referring Jamye Balicki: [name] in Treatment: 13 Vital Signs Time Taken: 08:16 Temperature (F): 97.8 Height (in): 74 Pulse (bpm): 66 Weight (lbs): 252 Respiratory Rate (breaths/min): 19 Body Mass Index (BMI): 32.4 Blood Pressure (mmHg): 156/78 Reference Range: 80 - 120 mg / dl Electronic Signature(s) Signed: 09/21/2016 5:27:57 PM By: [name] [name], RN Entered By: [name] on 09/21/2016 08:17:50

## 2016-09-24 NOTE — Progress Notes (Signed)
MRN : [id number]  [name] is a 66 y.o. (1951-01-01) male who presents with chief complaint of  Chief Complaint  Patient presents with  . Follow-up  .  History of Present Illness: Patient returns today in follow up of Venous insufficiency with ulceration of the right lower extremity. He still goes to the wound care center. His ulcer on his right legs improving but has not healed. He has undergone successful laser ablation of the small saphenous vein as well as the great saphenous vein although a segment of the great saphenous vein does remain patent in the upper thigh. No DVT or superficial thrombophlebitis seen. His leg swelling has improved some on the right, but is still noticeable bilaterally. He had pain and bruising for about 1 week after the procedure.       Past Medical History:  Diagnosis Date  . Atrial fibrillation ([place])   . Cocaine abuse    + UDS on admission  . Gout current and history of  . History of deep vein thrombosis 2006  . History of tobacco abuse    has smoked for 50 years  . Hypertension   . Pulmonary embolism ([place])   . Stroke [facility])          Past Surgical History:  Procedure Laterality Date  . VASCULAR SURGERY     Stent in leg         Family History  Problem Relation Age of Onset  . Aneurysm Mother   . Diabetes Father   No bleeding disorders, clotting disorders, or autoimmune diseases  Social History      Social History  Substance Use Topics  . Smoking status: Current Every Day Smoker    Packs/day: 0.50    Years: 50.00    Types: Cigarettes  . Smokeless tobacco: Never Used  . Alcohol use No  Previous cocaine use present.  No Known Allergies        Current Outpatient Prescriptions  Medication Sig Dispense Refill  . aspirin 81 MG tablet Take 81 mg by mouth daily.    . cephALEXin (KEFLEX) 500 MG capsule Take 1 capsule (500 mg total) by mouth 3 (three) times daily. 21 capsule 0  . colchicine 0.6 MG  tablet Take 0.6 mg by mouth daily as needed (for gout).     [name] dicyclomine (BENTYL) 10 MG capsule Take 1 capsule (10 mg total) by mouth 3 (three) times daily before meals. 20 capsule 0  . furosemide (LASIX) 40 MG tablet     . FUROSEMIDE PO Take 1 tablet by mouth daily.    [name] HYDROcodone-acetaminophen (NORCO) 5-325 MG tablet Take 1 tablet by mouth every 4 (four) hours as needed for moderate pain. 3 tablet 0  . lactulose (CHRONULAC) 10 GM/15ML solution Take 30 mLs (20 g total) by mouth daily as needed for mild constipation. 120 mL 0  . metoprolol tartrate (LOPRESSOR) 25 MG tablet Take 25 mg by mouth 2 (two) times daily.    [name] oxyCODONE-acetaminophen (ROXICET) 5-325 MG tablet Take 1 tablet by mouth every 4 (four) hours as needed for severe pain. 15 tablet 0  . polyethylene glycol (MIRALAX) packet Take 17 g by mouth daily. 14 each 0  . simvastatin (ZOCOR) 20 MG tablet Take 20 mg by mouth daily.    [name] warfarin (COUMADIN) 2 MG tablet Take 2 mg by mouth daily. Take with the 12m tablet for a total of 848m    . warfarin (COUMADIN) 6 MG tablet Take 6  mg by mouth daily. Every evening at 5 pm. Take with 2m tablet for a total of 870m     No current facility-administered medications for this visit.       REVIEW OF SYSTEMS (Negative unless checked)  Constitutional: [] Weight loss  [] Fever  [] Chills Cardiac: [] Chest pain   [] Chest pressure   [] Palpitations   [] Shortness of breath when laying flat   [] Shortness of breath at rest   [x] Shortness of breath with exertion. Vascular:  [] Pain in legs with walking   [] Pain in legs at rest   [] Pain in legs when laying flat   [] Claudication   [] Pain in feet when walking  [] Pain in feet at rest  [] Pain in feet when laying flat   [x] History of DVT   [] Phlebitis   [x] Swelling in legs   [x] Varicose veins   [x] Non-healing ulcers Pulmonary:   [] Uses home oxygen   [] Productive cough   [] Hemoptysis   [] Wheeze  [] COPD   [] Asthma Neurologic:  [] Dizziness   [] Blackouts   [] Seizures   [] History of stroke   [] History of TIA  [] Aphasia   [] Temporary blindness   [] Dysphagia   [] Weakness or numbness in arms   [] Weakness or numbness in legs Musculoskeletal:  [] Arthritis   [] Joint swelling   [] Joint pain   [] Low back pain Hematologic:  [] Easy bruising  [] Easy bleeding   [] Hypercoagulable state   [] Anemic  [] Hepatitis Gastrointestinal:  [] Blood in stool   [] Vomiting blood  [] Gastroesophageal reflux/heartburn   [] Abdominal pain Genitourinary:  [] Chronic kidney disease   [] Difficult urination  [] Frequent urination  [] Burning with urination   [] Hematuria Skin:  [] Rashes   [x] Ulcers   [x] Wounds Psychological:  [] History of anxiety   []  History of major depression.    Physical Examination  BP (!) 156/97   Pulse 62   Resp 16   Wt 253 lb (114.8 kg)   BMI 32.48 kg/m  Gen:  WD/WN, NAD Head: [place]/AT, No temporalis wasting. Ear/Nose/Throat: Hearing grossly intact, nares w/o erythema or drainage, trachea midline Eyes: Conjunctiva clear. Sclera non-icteric Neck: Supple.  No JVD.  Pulmonary:  Good air movement, no use of accessory muscles.  Cardiac: RRR, normal S1, S2 Vascular:  Vessel Right Left  Radial Palpable Palpable                                   Gastrointestinal: soft, non-tender/non-distended.  Musculoskeletal: M/S 5/5 throughout.  No deformity or atrophy. 1-2+ bilateral lower extremity edema. Right leg wrapped in an [place] boot today Neurologic: Sensation grossly intact in extremities.  Symmetrical.  Speech is fluent.  Psychiatric: Judgment intact, Mood & affect appropriate for pt's clinical situation. Dermatologic: Right leg is wrapped in an [employer].      Labs Recent Results (from the past 2160 hour(s))  CBC with Differential     Status: Abnormal   Collection Time: 06/27/16 11:24 AM  Result Value Ref Range   WBC 3.6 (L) 3.8 - 10.6 K/uL   RBC 4.85 4.40 - 5.90 MIL/uL   Hemoglobin 11.9 (L) 13.0 - 18.0 g/dL   HCT 36.6 (L)  40.0 - 52.0 %   MCV 75.5 (L) 80.0 - 100.0 fL   MCH 24.5 (L) 26.0 - 34.0 pg   MCHC 32.5 32.0 - 36.0 g/dL   RDW 18.4 (H) 11.5 - 14.5 %   Platelets 147 (L) 150 - 440 K/uL   Neutrophils Relative % 56 %  Neutro Abs 2.0 1.4 - 6.5 K/uL   Lymphocytes Relative 31 %   Lymphs Abs 1.1 1.0 - 3.6 K/uL   Monocytes Relative 10 %   Monocytes Absolute 0.4 0.2 - 1.0 K/uL   Eosinophils Relative 3 %   Eosinophils Absolute 0.1 0 - 0.7 K/uL   Basophils Relative 0 %   Basophils Absolute 0.0 0 - 0.1 K/uL  Comprehensive metabolic panel     Status: Abnormal   Collection Time: 06/27/16 11:24 AM  Result Value Ref Range   Sodium 141 135 - 145 mmol/L   Potassium 3.8 3.5 - 5.1 mmol/L   Chloride 107 101 - 111 mmol/L   CO2 29 22 - 32 mmol/L   Glucose, Bld 110 (H) 65 - 99 mg/dL   BUN 18 6 - 20 mg/dL   Creatinine, Ser 0.90 0.61 - 1.24 mg/dL   Calcium 8.7 (L) 8.9 - 10.3 mg/dL   Total Protein 6.9 6.5 - 8.1 g/dL   Albumin 3.4 (L) 3.5 - 5.0 g/dL   AST 37 15 - 41 U/L   ALT 27 17 - 63 U/L   Alkaline Phosphatase 84 38 - 126 U/L   Total Bilirubin 0.9 0.3 - 1.2 mg/dL   GFR calc non Af Amer >60 >60 mL/min   GFR calc Af Amer >60 >60 mL/min    Comment: (NOTE) The eGFR has been calculated using the CKD EPI equation. This calculation has not been validated in all clinical situations. eGFR's persistently <60 mL/min signify possible Chronic Kidney Disease.    Anion gap 5 5 - 15  Protime-INR     Status: Abnormal   Collection Time: 06/27/16  4:43 PM  Result Value Ref Range   Prothrombin Time 28.0 (H) 11.4 - 15.2 seconds   INR 2.56   VAS [place] LOWER EXTREMITY ARTERIAL DUPLEX     Status: None   Collection Time: 07/05/16  8:47 AM  Result Value Ref Range   Right peroneal sys min -9 m/s   Right super femoral prox sys PSV -138 cm/s   Right super femoral mid sys PSV -107 cm/s   Right super femoral dist sys PSV -77 cm/s   Right popliteal prox sys PSV 81 cm/s   Right popliteal dist sys PSV -122 cm/s   Right peroneal sys PSV  -61 cm/s   RIGHT SUPER FEMORAL PROX EDV 0 cm/sec   RIGHT SUPER FEMORAL MID EDV -4 cm/sec   RIGHT SUPER FEMORAL DIST EDV -7 cm/sec   RIGHT POPLITEAL PROX EDV 9 cm/sec   RIGHT POPLITEAL DIST EDV -5 cm/sec  CBC with Differential/Platelet     Status: Abnormal   Collection Time: 07/18/16  7:22 AM  Result Value Ref Range   WBC 5.0 3.8 - 10.6 K/uL   RBC 5.19 4.40 - 5.90 MIL/uL   Hemoglobin 12.5 (L) 13.0 - 18.0 g/dL   HCT 39.5 (L) 40.0 - 52.0 %   MCV 76.1 (L) 80.0 - 100.0 fL   MCH 24.1 (L) 26.0 - 34.0 pg   MCHC 31.7 (L) 32.0 - 36.0 g/dL   RDW 17.8 (H) 11.5 - 14.5 %   Platelets 141 (L) 150 - 440 K/uL   Neutrophils Relative % 70 %   Neutro Abs 3.5 1.4 - 6.5 K/uL   Lymphocytes Relative 18 %   Lymphs Abs 0.9 (L) 1.0 - 3.6 K/uL   Monocytes Relative 10 %   Monocytes Absolute 0.5 0.2 - 1.0 K/uL   Eosinophils Relative 1 %   Eosinophils Absolute  0.1 0 - 0.7 K/uL   Basophils Relative 1 %   Basophils Absolute 0.0 0 - 0.1 K/uL  Comprehensive metabolic panel     Status: Abnormal   Collection Time: 07/18/16  7:22 AM  Result Value Ref Range   Sodium 138 135 - 145 mmol/L   Potassium 3.8 3.5 - 5.1 mmol/L   Chloride 101 101 - 111 mmol/L   CO2 29 22 - 32 mmol/L   Glucose, Bld 110 (H) 65 - 99 mg/dL   BUN 12 6 - 20 mg/dL   Creatinine, Ser 0.77 0.61 - 1.24 mg/dL   Calcium 8.7 (L) 8.9 - 10.3 mg/dL   Total Protein 7.4 6.5 - 8.1 g/dL   Albumin 3.3 (L) 3.5 - 5.0 g/dL   AST 32 15 - 41 U/L   ALT 22 17 - 63 U/L   Alkaline Phosphatase 87 38 - 126 U/L   Total Bilirubin 1.4 (H) 0.3 - 1.2 mg/dL   GFR calc non Af Amer >60 >60 mL/min   GFR calc Af Amer >60 >60 mL/min    Comment: (NOTE) The eGFR has been calculated using the CKD EPI equation. This calculation has not been validated in all clinical situations. eGFR's persistently <60 mL/min signify possible Chronic Kidney Disease.    Anion gap 8 5 - 15  Protime-INR     Status: Abnormal   Collection Time: 07/18/16  7:22 AM  Result Value Ref Range    Prothrombin Time 24.8 (H) 11.4 - 15.2 seconds   INR 2.20   Uric acid     Status: Abnormal   Collection Time: 07/18/16  7:22 AM  Result Value Ref Range   Uric Acid, Serum 8.0 (H) 4.4 - 7.6 mg/dL  Aerobic Culture (superficial specimen)     Status: Abnormal   Collection Time: 09/14/16  8:40 AM  Result Value Ref Range   Specimen Description LEG    Special Requests NONE    Gram Stain      RARE WBC PRESENT,BOTH PMN AND MONONUCLEAR RARE GRAM POSITIVE COCCI IN PAIRS RARE GRAM NEGATIVE RODS Performed at [facility], [address] [place],  [address]    Culture MULTIPLE ORGANISMS PRESENT, NONE PREDOMINANT (A)    Report Status 09/16/2016 FINAL     Radiology No results found.    Assessment/Plan  HTN (hypertension), malignant blood pressure control important in reducing the progression of atherosclerotic disease. On appropriate oral medications.   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 There is still a segment in the proximal thigh that could be contributing to venous hypertension and if his ulcer does not improve over the next 2 or 3 months or he has early recurrence, I would consider repeat ablation to this area. Return to clinic in 2-3 months for follow-up for further evaluation. Resume all normal activities and can continue compression on the legs.  Diabetes mellitus ([place]) blood glucose control important in reducing the progression of atherosclerotic disease. Also, involved in wound healing. On appropriate medications.     Leotis Pain, MD  09/24/2016 10:06 AM    This note was created with Dragon medical transcription system.  Any errors from dictation are purely unintentional

## 2016-09-28 ENCOUNTER — Encounter: Payer: Medicare Other | Admitting: Internal Medicine

## 2016-09-28 DIAGNOSIS — I87331 Chronic venous hypertension (idiopathic) with ulcer and inflammation of right lower extremity: Secondary | ICD-10-CM | POA: Diagnosis not present
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
## 2016-09-29 NOTE — Progress Notes (Signed)
[name], [name] ([id number]) Visit Report for 09/28/2016 Arrival Information Details Derocher, Date of Service: 09/28/2016 8:15 AM Patient Name: [name] Patient Account Number: [id number] Medical Record Treating RN: [name] [id number] Number: Other Clinician: Date of Birth/Sex: December 28, 1950 (66 y.o. Male) Treating [name] Primary Care [name]: [name] [name]/Extender: [name]: [name] in [name]: 14 Visit Information History Since Last Visit Added or deleted any medications: No Patient Arrived: Ambulatory Any new allergies or adverse reactions: No Arrival Time: 08:04 Had a fall or experienced change in No Accompanied By: self activities of daily living that may affect Transfer Assistance: None risk of falls: Patient Identification Verified: Yes Signs or symptoms of abuse/neglect since last No Secondary Verification Process Yes visito Completed: Hospitalized since last visit: No Patient Requires Transmission- No Has Dressing in Place as Prescribed: Yes Based Precautions: Pain Present Now: Yes Patient Has Alerts: Yes Patient Alerts: Patient on Blood Thinner warfarin Electronic Signature(s) Signed: 09/28/2016 5:19:45 PM By: Mark Cool, RN, [name], [name] Entered By: Mark Cool, RN, [name], Mark on 09/28/2016 08:05:11 [name], [name] ([id number]) -------------------------------------------------------------------------------- Encounter Discharge Information Details [name], Date of Service: 09/28/2016 8:15 AM Patient Name: [name] Patient Account Number: [id number] Medical Record Treating RN: [name] [id number] Number: Other Clinician: Date of Birth/Sex: [birth year] (66 y.o. Male) Treating [name], [name] Primary Care [name]: [name] [name]/Extender: Mark Referring Niara Bunker: [name] in [name]: 14 Encounter Discharge Information Items Discharge Pain Level: 0 Discharge Condition: Stable Ambulatory  Status: Ambulatory Discharge Destination: Home Transportation: Private Auto Accompanied By: self Schedule Follow-up Appointment: Yes Medication Reconciliation completed Yes and provided to Patient/Care [name]: Patient Clinical Summary of Care: Declined Electronic Signature(s) Signed: 09/28/2016 5:19:45 PM By: Mark Cool, RN, [name], [name] Previous Signature: 09/28/2016 8:41:48 AM Version By: Ruthine Dose Entered By: Mark Cool RN, [name], Mark on 09/28/2016 08:46:03 [name], [name] ([id number]) -------------------------------------------------------------------------------- Lower Extremity Assessment Details [name], Date of Service: 09/28/2016 8:15 AM Patient Name: [name] Patient Account Number: [id number] Medical Record Treating RN: [name] [id number] Number: Other Clinician: Date of Birth/Sex: [birth year] (65 y.o. Male) Treating [name], [name] Primary Care Nathyn Luiz: [name] Amaria Mundorf/Extender: Mark Referring [name]: [name] [name]: 14 Edema Assessment Assessed: [Left: No] [Right: No] E[Left: dema] [Right: :] Calf Left: Right: Point of Measurement: 38 cm From Medial Instep cm 43 cm Ankle Left: Right: Point of Measurement: 12 cm From Medial Instep cm 28 cm Vascular Assessment Claudication: Claudication Assessment [Right:None] Pulses: Dorsalis Pedis Palpable: [Right:Yes] Posterior Tibial Palpable: [Right:Yes] Extremity colors, hair growth, and conditions: Extremity Color: [Right:Hyperpigmented] Hair Growth on Extremity: [Right:Yes] Temperature of Extremity: [Right:Warm] Capillary Refill: [Right:< 3 seconds] Dependent Rubor: [Right:No] Blanched when Elevated: [Right:No] Lipodermatosclerosis: [Right:No] Toe Nail Assessment Left: Right: Thick: Yes Discolored: Yes Deformed: Yes [name], [name] ([id number]) Improper Length and Hygiene: Yes Electronic Signature(s) Signed: 09/28/2016 5:19:45 PM By: Mark Cool, RN, [name], [name] Entered By:  Mark Cool, RN, [name], Mark on 09/28/2016 08:16:48 [name], [name] ([id number]) -------------------------------------------------------------------------------- Multi Wound Chart Details [name], Date of Service: 09/28/2016 8:15 AM Patient Name: [name] Patient Account Number: [id number] Medical Record Treating RN: [name] [id number] Number: Other Clinician: Date of Birth/Sex: 08-Aug-1950 (65 y.o. Male) Treating [name] Primary Care [name]: [name] Tremond Shimabukuro/Extender: Mark Referring [name]: [name] [name]: 14 Vital Signs Height(in): 74 Pulse(bpm): 65 Weight(lbs): 252 Blood Pressure 154/74 (mmHg): Body Mass Index(BMI): 32 Temperature(F): 97.5 Respiratory Rate 18 (breaths/min): Photos: [N/A:N/A] Wound Location: Right Lower Leg - Medial N/A N/A Wounding Event: Gradually Appeared N/A N/A Primary Etiology: Venous Leg Ulcer N/A N/A  Comorbid History: Arrhythmia, Hypertension, N/A N/A Gout, Neuropathy Date Acquired: 06/02/2016 N/A N/A Mark of [name]: 14 N/A N/A Wound Status: Open N/A N/A Measurements L x W x D 2x1.5x0.2 N/A N/A (cm) Area (cm) : 2.356 N/A N/A Volume (cm) : 0.471 N/A N/A % Reduction in Area: 9.10% N/A N/A % Reduction in Volume: -81.90% N/A N/A Classification: Partial Thickness N/A N/A Exudate Amount: Large N/A N/A Exudate Type: Serosanguineous N/A N/A Exudate Color: red, brown N/A N/A Wound Margin: Epibole N/A N/A Granulation Amount: Small (1-33%) N/A N/A Granulation Quality: Pink, Pale N/A N/A Necrotic Amount: Large (67-100%) N/A N/A [name], Mark ([id number]) Exposed Structures: Fat Layer (Subcutaneous N/A N/A Tissue) Exposed: Yes Fascia: No Tendon: No Muscle: No Joint: No Bone: No Epithelialization: Small (1-33%) N/A N/A Debridement: Debridement ([id number]- N/A N/A 11047) Pre-procedure 08:28 N/A N/A Verification/Time Out Taken: Pain Control: Other N/A N/A Tissue Debrided: Exudates, Skin, N/A  N/A Subcutaneous Level: Skin/Subcutaneous N/A N/A Tissue Debridement Area (sq 3 N/A N/A cm): Instrument: Curette N/A N/A Bleeding: Moderate N/A N/A Hemostasis Achieved: Pressure N/A N/A Procedural Pain: 3 N/A N/A Post Procedural Pain: 3 N/A N/A Debridement [name] Procedure was tolerated N/A N/A Response: well Post Debridement 2x1.5x0.3 N/A N/A Measurements L x W x D (cm) Post Debridement 0.707 N/A N/A Volume: (cm) Periwound Skin Texture: Scarring: Yes N/A N/A Excoriation: No Induration: No Callus: No Crepitus: No Rash: No Periwound Skin Maceration: Yes N/A N/A Moisture: Dry/Scaly: No Periwound Skin Color: Hemosiderin Staining: Yes N/A N/A Atrophie Blanche: No Cyanosis: No Ecchymosis: No Erythema: No Mottled: No Pallor: No Rubor: No Temperature: No Abnormality N/A N/A Yes N/A N/A Stancil, Pradyun ([id number]) Tenderness on Palpation: Wound Preparation: Ulcer Cleansing: N/A N/A Rinsed/Irrigated with Saline, Other: surg scrub and water Topical Anesthetic Applied: Other: lidocaine 4% Procedures Performed: Debridement N/A N/A [name] Notes Electronic Signature(s) Signed: 09/28/2016 4:47:06 PM By: Mark Ham MD Entered By: [name] on 09/28/2016 08:34:37 Farney, [name] ([id number]) -------------------------------------------------------------------------------- Multi-Disciplinary Care Plan Details Calarco, Date of Service: 09/28/2016 8:15 AM Patient Name: [name] Patient Account Number: [id number] Medical Record Treating RN: [name] [id number] Number: Other Clinician: Date of Birth/Sex: [birth year] (66 y.o. Male) Treating [name], Salcha Primary Care Nikolay Demetriou: [name] Aleiyah Halpin/Extender: Mark Referring Nakema Fake: [name] in [name]: 14 Active Inactive ` Orientation to the Wound Care Program Nursing Diagnoses: Knowledge deficit related to the wound healing center program Goals: Patient/caregiver will verbalize  understanding of the [facility] Date Initiated: 06/16/2016 Target Resolution Date: 08/15/2016 Goal Status: Active Interventions: Provide education on orientation to the wound center Notes: ` Venous Leg Ulcer Nursing Diagnoses: Potential for venous Insuffiency (use before diagnosis confirmed) Goals: Patient will maintain optimal edema control Date Initiated: 06/16/2016 Target Resolution Date: 08/19/2016 Goal Status: Active Interventions: Compression as ordered Notes: ` Wound/Skin Impairment Nursing Diagnoses: RAYSHON, ALBAUGH ([id number]) Impaired tissue integrity Goals: Patient/caregiver will verbalize understanding of skin care regimen Date Initiated: 06/16/2016 Target Resolution Date: 07/15/2016 Goal Status: Active Ulcer/skin breakdown will have a volume reduction of 30% by week 4 Date Initiated: 06/16/2016 Target Resolution Date: 07/15/2016 Goal Status: Active Ulcer/skin breakdown will have a volume reduction of 50% by week 8 Date Initiated: 06/16/2016 Target Resolution Date: 07/15/2016 Goal Status: Active Ulcer/skin breakdown will have a volume reduction of 80% by week 12 Date Initiated: 06/16/2016 Target Resolution Date: 07/15/2016 Goal Status: Active Ulcer/skin breakdown will heal within 14 Mark Date Initiated: 06/16/2016 Target Resolution Date: 07/15/2016 Goal Status: Active Interventions: Assess patient/caregiver ability to obtain necessary supplies Assess patient/caregiver ability to perform  ulcer/skin care regimen upon admission and as needed Assess ulceration(s) every visit Notes: Electronic Signature(s) Signed: 09/28/2016 5:19:45 PM By: Mark Cool, RN, [name], [name] Entered By: Mark Cool, RN, [name], Mark on 09/28/2016 08:17:58 [place], [name] ([id number]) -------------------------------------------------------------------------------- Pain Assessment Details Mohabir, Date of Service: 09/28/2016 8:15 AM Patient Name: [name] Patient Account Number:  [id number] Medical Record Treating RN: [name] [id number] Number: Other Clinician: Date of Birth/Sex: [birth year] (65 y.o. Male) Treating [name] Primary Care Deland Slocumb: [name] Dartha Rozzell/Extender: Mark Referring Rodolphe Edmonston: [name] in [name]: 14 Active Problems Location of Pain Severity and Description of Pain Patient Has Paino Yes Site Locations Pain Location: Pain in Ulcers With Dressing Change: Yes Duration of the Pain. Constant / Intermittento Constant Rate the pain. Current Pain Level: 5 Character of Pain Describe the Pain: Sharp, Throbbing Pain Management and Medication Current Pain Management: Medication: Yes Goals for Pain Management Topical or injectable lidocaine is offered to patient for acute pain when surgical debridement is performed. If needed, Patient is instructed to use over the counter pain medication for the following 24-48 hours after debridement. Wound care MDs do not prescribed pain medications. Patient has chronic pain or uncontrolled pain. Patient has been instructed to make an appointment with their Primary Care Physician for pain management. Electronic Signature(s) Signed: 09/28/2016 5:19:45 PM By: Mark Cool, RN, [name], [name] Entered By: Mark Cool, RN, [name], Mark on 09/28/2016 08:05:49 Saldarriaga, [name] ([id number]) -------------------------------------------------------------------------------- Patient/Caregiver Education Details [place], Date of Service: 09/28/2016 8:15 AM Patient Name: [name] Patient Account Number: [id number] Medical Record Treating RN: [name] [id number] Number: Other Clinician: Date of Birth/Gender: 1950/06/15 (66 y.o. Male) Treating [name] Primary Care Physician/Extender: Tonye Pearson Physician: Suella Grove in [name]: 14 Referring Physician: Royetta [name] Education Assessment Education Provided To: Patient Education Topics Provided Venous: Handouts: Controlling Swelling with  Multilayered Compression Wraps Methods: Demonstration Responses: State content correctly Wound/Skin Impairment: Handouts: Caring for Your Ulcer Methods: Demonstration Responses: State content correctly Electronic Signature(s) Signed: 09/28/2016 5:19:45 PM By: Mark Cool, RN, [name], [name] Entered By: Mark Cool, RN, [name], Mark on 09/28/2016 08:46:19 Landowski, [name] ([id number]) -------------------------------------------------------------------------------- Wound Assessment Details Lo[place]on, Date of Service: 09/28/2016 8:15 AM Patient Name: [name] Patient Account Number: [id number] Medical Record Treating RN: [name] [id number] Number: Other Clinician: Date of Birth/Sex: 1950/07/25 (65 y.o. Male) Treating [name] Primary Care Roth Ress: [name] Demisha Nokes/Extender: Mark Referring Arienne Gartin: [name] in [name]: 14 Wound Status Wound Number: 4 Primary Venous Leg Ulcer Etiology: Wound Location: Right Lower Leg - Medial Wound Status: Open Wounding Event: Gradually Appeared Comorbid Arrhythmia, Hypertension, Gout, Date Acquired: 06/02/2016 History: Neuropathy Mark Of [name]: 14 Clustered Wound: No Photos Wound Measurements Length: (cm) 2 Width: (cm) 1.5 Depth: (cm) 0.2 Area: (cm) 2.356 Volume: (cm) 0.471 % Reduction in Area: 9.1% % Reduction in Volume: -81.9% Epithelialization: Small (1-33%) Tunneling: No Undermining: No Wound Description Classification: Partial Thickness Wound Margin: Epibole Exudate Amount: Large Exudate Type: Serosanguineous Exudate Color: red, brown Foul Odor After Cleansing: No Slough/Fibrino Yes Wound Bed Granulation Amount: Small (1-33%) Exposed Structure Granulation Quality: Pink, Pale Fascia Exposed: No Necrotic Amount: Large (67-100%) Fat Layer (Subcutaneous Tissue) Exposed: Yes Necrotic Quality: Adherent Slough Tendon Exposed: No Muscle Exposed: No Joint Exposed: No Davis, Evens ([id number]) Bone  Exposed: No Periwound Skin Texture Texture Color No Abnormalities Noted: No No Abnormalities Noted: No Callus: No Atrophie Blanche: No Crepitus: No Cyanosis: No Excoriation: No Ecchymosis: No Induration: No Erythema: No Rash: No Hemosiderin Staining: Yes Scarring: Yes Mottled: No Pallor: No Moisture Rubor:  No No Abnormalities Noted: No Dry / Scaly: No Temperature / Pain Maceration: Yes Temperature: No Abnormality Tenderness on Palpation: Yes Wound Preparation Ulcer Cleansing: Rinsed/Irrigated with Saline, Other: surg scrub and water, Topical Anesthetic Applied: Other: lidocaine 4%, [name] Notes Wound #4 (Right, Medial Lower Leg) 1. Cleansed with: Cleanse wound with antibacterial soap and water 2. Anesthetic Topical Lidocaine 4% cream to wound bed prior to debridement 4. Dressing Applied: Hydrafera Blue 5. Secondary Dressing Applied ABD Pad 7. Secured with 3 Layer Compression System - Right Lower Extremity Notes unna to Engineer, production) Signed: 09/28/2016 5:19:45 PM By: Mark Cool, RN, [name], [name] Entered By: Mark Cool, RN, [name], Mark on 09/28/2016 08:29:30 Cockrell, [name] ([id number]) -------------------------------------------------------------------------------- Vitals Details Marshall, Date of Service: 09/28/2016 8:15 AM Patient Name: [name] Patient Account Number: [id number] Medical Record Treating RN: [name] [id number] Number: Other Clinician: Date of Birth/Sex: [birth year] (66 y.o. Male) Treating [name] Primary Care Karo Rog: [name] [place] Freyre/Extender: Mark Referring Bonnie Roig: [name] in [name]: 14 Vital Signs Time Taken: 08:10 Temperature (F): 97.5 Height (in): 74 Pulse (bpm): 65 Weight (lbs): 252 Respiratory Rate (breaths/min): 18 Body Mass Index (BMI): 32.4 Blood Pressure (mmHg): 154/74 Reference Range: 80 - 120 mg / dl Electronic Signature(s) Signed: 09/28/2016 5:19:45 PM By: Mark Cool, RN, [name],  [name] Entered By: Mark Cool, RN, [name], Mark on 09/28/2016 08:10:55

## 2016-10-03 ENCOUNTER — Inpatient Hospital Stay: Payer: Medicare Other

## 2016-10-03 ENCOUNTER — Encounter: Payer: Self-pay | Admitting: Emergency Medicine

## 2016-10-03 ENCOUNTER — Inpatient Hospital Stay
Admission: EM | Admit: 2016-10-03 | Discharge: 2016-10-05 | DRG: 065 | Disposition: A | Discharge status: Home / Self Care | Payer: Medicare Other | Attending: Internal Medicine | Admitting: Internal Medicine

## 2016-10-03 ENCOUNTER — Emergency Department: Payer: Medicare Other

## 2016-10-03 DIAGNOSIS — Z86718 Personal history of other venous thrombosis and embolism: Secondary | ICD-10-CM

## 2016-10-03 DIAGNOSIS — Z7982 Long term (current) use of aspirin: Secondary | ICD-10-CM | POA: Diagnosis not present

## 2016-10-03 DIAGNOSIS — R29810 Facial weakness: Secondary | ICD-10-CM | POA: Diagnosis not present

## 2016-10-03 DIAGNOSIS — Z86711 Personal history of pulmonary embolism: Secondary | ICD-10-CM

## 2016-10-03 DIAGNOSIS — I69320 Aphasia following cerebral infarction: Secondary | ICD-10-CM

## 2016-10-03 DIAGNOSIS — T501X5A Adverse effect of loop [high-ceiling] diuretics, initial encounter: Secondary | ICD-10-CM | POA: Diagnosis present

## 2016-10-03 DIAGNOSIS — G514 Facial myokymia: Secondary | ICD-10-CM

## 2016-10-03 DIAGNOSIS — I639 Cerebral infarction, unspecified: Principal | ICD-10-CM | POA: Diagnosis present

## 2016-10-03 DIAGNOSIS — I10 Essential (primary) hypertension: Secondary | ICD-10-CM | POA: Diagnosis present

## 2016-10-03 DIAGNOSIS — R1310 Dysphagia, unspecified: Secondary | ICD-10-CM | POA: Diagnosis not present

## 2016-10-03 DIAGNOSIS — E785 Hyperlipidemia, unspecified: Secondary | ICD-10-CM | POA: Diagnosis not present

## 2016-10-03 DIAGNOSIS — Z79899 Other long term (current) drug therapy: Secondary | ICD-10-CM | POA: Diagnosis not present

## 2016-10-03 DIAGNOSIS — R4781 Slurred speech: Secondary | ICD-10-CM | POA: Diagnosis present

## 2016-10-03 DIAGNOSIS — F17210 Nicotine dependence, cigarettes, uncomplicated: Secondary | ICD-10-CM | POA: Diagnosis not present

## 2016-10-03 DIAGNOSIS — Z7901 Long term (current) use of anticoagulants: Secondary | ICD-10-CM

## 2016-10-03 DIAGNOSIS — M109 Gout, unspecified: Secondary | ICD-10-CM | POA: Diagnosis not present

## 2016-10-03 DIAGNOSIS — I480 Paroxysmal atrial fibrillation: Secondary | ICD-10-CM | POA: Diagnosis not present

## 2016-10-03 DIAGNOSIS — I69351 Hemiplegia and hemiparesis following cerebral infarction affecting right dominant side: Secondary | ICD-10-CM | POA: Diagnosis not present

## 2016-10-03 DIAGNOSIS — E876 Hypokalemia: Secondary | ICD-10-CM | POA: Diagnosis present

## 2016-10-03 DIAGNOSIS — R29705 NIHSS score 5: Secondary | ICD-10-CM | POA: Diagnosis present

## 2016-10-03 LAB — URINE DRUG SCREEN, QUALITATIVE (ARMC ONLY)
AMPHETAMINES, UR SCREEN: NOT DETECTED
BENZODIAZEPINE, UR SCRN: NOT DETECTED
Barbiturates, Ur Screen: NOT DETECTED
CANNABINOID 50 NG, UR ~~LOC~~: NOT DETECTED
Cocaine Metabolite,Ur ~~LOC~~: NOT DETECTED
MDMA (Ecstasy)Ur Screen: NOT DETECTED
Methadone Scn, Ur: NOT DETECTED
OPIATE, UR SCREEN: NOT DETECTED
PHENCYCLIDINE (PCP) UR S: NOT DETECTED
Tricyclic, Ur Screen: NOT DETECTED

## 2016-10-03 LAB — PROTIME-INR
INR: 2.54
Prothrombin Time: 27.8 seconds — ABNORMAL HIGH (ref 11.4–15.2)

## 2016-10-03 LAB — CBC
HEMATOCRIT: 36.6 % — AB (ref 40.0–52.0)
Hemoglobin: 11.9 g/dL — ABNORMAL LOW (ref 13.0–18.0)
MCH: 24.4 pg — AB (ref 26.0–34.0)
MCHC: 32.5 g/dL (ref 32.0–36.0)
MCV: 75.2 fL — AB (ref 80.0–100.0)
PLATELETS: 135 10*3/uL — AB (ref 150–440)
RBC: 4.87 MIL/uL (ref 4.40–5.90)
RDW: 18.9 % — AB (ref 11.5–14.5)
WBC: 4 10*3/uL (ref 3.8–10.6)

## 2016-10-03 LAB — COMPREHENSIVE METABOLIC PANEL
ALT: 20 U/L (ref 17–63)
AST: 32 U/L (ref 15–41)
Albumin: 3.5 g/dL (ref 3.5–5.0)
Alkaline Phosphatase: 72 U/L (ref 38–126)
Anion gap: 7 (ref 5–15)
BILIRUBIN TOTAL: 0.9 mg/dL (ref 0.3–1.2)
BUN: 13 mg/dL (ref 6–20)
CHLORIDE: 101 mmol/L (ref 101–111)
CO2: 28 mmol/L (ref 22–32)
CREATININE: 1.11 mg/dL (ref 0.61–1.24)
Calcium: 8.5 mg/dL — ABNORMAL LOW (ref 8.9–10.3)
Glucose, Bld: 85 mg/dL (ref 65–99)
POTASSIUM: 3.2 mmol/L — AB (ref 3.5–5.1)
SODIUM: 136 mmol/L (ref 135–145)
TOTAL PROTEIN: 7 g/dL (ref 6.5–8.1)

## 2016-10-03 LAB — GLUCOSE, CAPILLARY: GLUCOSE-CAPILLARY: 80 mg/dL (ref 65–99)

## 2016-10-03 LAB — DIFFERENTIAL
Basophils Absolute: 0 10*3/uL (ref 0–0.1)
Basophils Relative: 1 %
EOS PCT: 2 %
Eosinophils Absolute: 0.1 10*3/uL (ref 0–0.7)
LYMPHS PCT: 28 %
Lymphs Abs: 1.1 10*3/uL (ref 1.0–3.6)
MONO ABS: 0.4 10*3/uL (ref 0.2–1.0)
MONOS PCT: 9 %
Neutro Abs: 2.4 10*3/uL (ref 1.4–6.5)
Neutrophils Relative %: 60 %

## 2016-10-03 LAB — APTT: aPTT: 42 seconds — ABNORMAL HIGH (ref 24–36)

## 2016-10-03 LAB — TROPONIN I

## 2016-10-03 LAB — MAGNESIUM: Magnesium: 1.5 mg/dL — ABNORMAL LOW (ref 1.7–2.4)

## 2016-10-03 MED ORDER — WARFARIN SODIUM 6 MG PO TABS
6.0000 mg | ORAL_TABLET | Freq: Every day | ORAL | Status: DC
  Administered 2016-10-04 – 2016-10-05 (×2): 6 mg via ORAL
  Filled 2016-10-03 (×3): qty 1

## 2016-10-03 MED ORDER — MAGNESIUM SULFATE 2 GM/50ML IV SOLN
2.0000 g | Freq: Once | INTRAVENOUS | Status: AC
  Administered 2016-10-03: 2 g via INTRAVENOUS
  Filled 2016-10-03: qty 50

## 2016-10-03 MED ORDER — HYDROCODONE-ACETAMINOPHEN 5-325 MG PO TABS
1.0000 | ORAL_TABLET | ORAL | Status: DC | PRN
  Administered 2016-10-04: 22:00:00 1 via ORAL
  Filled 2016-10-03: qty 1

## 2016-10-03 MED ORDER — ACETAMINOPHEN 500 MG PO TABS: 500.0000 mg | ORAL_TABLET | Freq: Four times a day (QID) | ORAL | Status: DC | PRN

## 2016-10-03 MED ORDER — ACETAMINOPHEN 650 MG RE SUPP: 650.0000 mg | RECTAL | Status: DC | PRN

## 2016-10-03 MED ORDER — POLYETHYLENE GLYCOL 3350 17 G PO PACK
17.0000 g | PACK | Freq: Every day | ORAL | Status: DC
  Filled 2016-10-03: qty 1

## 2016-10-03 MED ORDER — METOPROLOL TARTRATE 25 MG PO TABS
25.0000 mg | ORAL_TABLET | Freq: Two times a day (BID) | ORAL | Status: DC
  Administered 2016-10-04: 10:00:00 25 mg via ORAL
  Filled 2016-10-03: qty 1

## 2016-10-03 MED ORDER — STROKE: EARLY STAGES OF RECOVERY BOOK
Freq: Once | Status: AC
  Administered 2016-10-03: 16:00:00 1

## 2016-10-03 MED ORDER — MORPHINE SULFATE (PF) 2 MG/ML IV SOLN
2.0000 mg | INTRAVENOUS | Status: DC | PRN
  Administered 2016-10-03 – 2016-10-04 (×2): 2 mg via INTRAVENOUS
  Filled 2016-10-03 (×2): qty 1

## 2016-10-03 MED ORDER — ACETAMINOPHEN 325 MG PO TABS: 650.0000 mg | ORAL_TABLET | ORAL | Status: DC | PRN

## 2016-10-03 MED ORDER — ASPIRIN EC 81 MG PO TBEC: 81.0000 mg | DELAYED_RELEASE_TABLET | Freq: Every day | ORAL | Status: DC

## 2016-10-03 MED ORDER — WARFARIN - PHARMACIST DOSING INPATIENT
Freq: Every day | Status: DC
  Administered 2016-10-03 – 2016-10-04 (×2)
  Filled 2016-10-03 (×4): qty 1

## 2016-10-03 MED ORDER — COLCHICINE 0.6 MG PO TABS
0.6000 mg | ORAL_TABLET | Freq: Every day | ORAL | Status: DC
  Administered 2016-10-04 – 2016-10-05 (×2): 0.6 mg via ORAL
  Filled 2016-10-03 (×2): qty 1

## 2016-10-03 MED ORDER — SODIUM CHLORIDE 0.9% FLUSH
3.0000 mL | Freq: Two times a day (BID) | INTRAVENOUS | Status: DC
  Administered 2016-10-03 – 2016-10-05 (×3): 3 mL via INTRAVENOUS

## 2016-10-03 MED ORDER — POTASSIUM CHLORIDE 10 MEQ/100ML IV SOLN
10.0000 meq | Freq: Once | INTRAVENOUS | Status: AC
  Administered 2016-10-03: 10 meq via INTRAVENOUS
  Filled 2016-10-03: qty 100

## 2016-10-03 MED ORDER — NICOTINE 21 MG/24HR TD PT24
21.0000 mg | MEDICATED_PATCH | Freq: Every day | TRANSDERMAL | Status: DC
  Administered 2016-10-03 – 2016-10-05 (×3): 21 mg via TRANSDERMAL
  Filled 2016-10-03 (×3): qty 1

## 2016-10-03 MED ORDER — LABETALOL HCL 5 MG/ML IV SOLN
10.0000 mg | INTRAVENOUS | Status: DC | PRN
  Administered 2016-10-04 (×3): 10 mg via INTRAVENOUS
  Filled 2016-10-03 (×5): qty 4

## 2016-10-03 MED ORDER — ACETAMINOPHEN 160 MG/5ML PO SOLN: 650.0000 mg | ORAL | Status: DC | PRN

## 2016-10-03 MED ORDER — SIMVASTATIN 20 MG PO TABS
20.0000 mg | ORAL_TABLET | Freq: Every day | ORAL | Status: DC
  Administered 2016-10-04 – 2016-10-05 (×2): 20 mg via ORAL
  Filled 2016-10-03 (×2): qty 1

## 2016-10-03 MED ORDER — LACTULOSE 10 GM/15ML PO SOLN: 20.0000 g | Freq: Every day | ORAL | Status: DC | PRN

## 2016-10-03 MED ORDER — SODIUM CHLORIDE 0.9 % IV SOLN
INTRAVENOUS | Status: DC
  Administered 2016-10-03 – 2016-10-04 (×3): via INTRAVENOUS

## 2016-10-03 MED ORDER — ASPIRIN 300 MG RE SUPP
300.0000 mg | Freq: Every day | RECTAL | Status: DC
  Administered 2016-10-03: 17:00:00 300 mg via RECTAL
  Filled 2016-10-03: qty 1

## 2016-10-03 NOTE — ED Triage Notes (Signed)
Pt to ED via POV stating that he thinks he had a stroke yesterday. Pt noted to have slurred speech, aphasia, and right sided facial droop. Pt states that symptoms started yesterday, unsure of time.

## 2016-10-03 NOTE — H&P (Signed)
[place] [facility] [place] NAME: Casmer Yepiz    MR#:  [id number]  DATE OF BIRTH:  April 09, 1951  DATE OF ADMISSION:  10/03/2016  PRIMARY CARE PHYSICIAN: [name], MD   REQUESTING/REFERRING PHYSICIAN: Earmon Phoenix MD  CHIEF COMPLAINT:   Chief Complaint  Patient presents with  . Aphasia  . Facial Pain    HISTORY OF PRESENT ILLNESS: Mylik Pro  is a 66 y.o. male with a known history of Atrial fibrillation, gout, history of DVT, pulmonary embolism, essential hypertension, previous CVA with chronic speech deficits who reports that he is compliant with his medication who is presenting with evaluation for new strokelike symptoms. Patient reports that he started developing right-sided facial droop, slurred speech and difficulty swallowing and numbness of the right upper extremity since yesterday morning. His symptoms persisted therefore came to the emergency room. In the emergency room is noted to have a facial droop. Has some difficulty with finding words. He states that there is no trouble with walking. Denies any changes in vision. CT scan of the head was negative in the emergency room. His INR is therapeutic.    PAST MEDICAL HISTORY:   Past Medical History:  Diagnosis Date  . Atrial fibrillation ([place])   . Cocaine abuse    + UDS on admission  . Gout current and history of  . History of deep vein thrombosis 2006  . History of tobacco abuse    has smoked for 50 years  . Hypertension   . Pulmonary embolism ([place])   . Stroke [facility])     PAST SURGICAL HISTORY: Past Surgical History:  Procedure Laterality Date  . VASCULAR SURGERY     Stent in leg    SOCIAL HISTORY:  Social History  Substance Use Topics  . Smoking status: Current Every Day Smoker    Packs/day: 0.50    Years: 50.00    Types: Cigarettes  . Smokeless tobacco: Never Used  . Alcohol use No    FAMILY HISTORY:  Family History  Problem Relation Age of  Onset  . Aneurysm Mother   . Diabetes Father     DRUG ALLERGIES: No Known Allergies  REVIEW OF SYSTEMS:   CONSTITUTIONAL: No fever, fatigue or weakness.  EYES: No blurred or double vision.  EARS, NOSE, AND THROAT: No tinnitus or ear pain.  RESPIRATORY: No cough, shortness of breath, wheezing or hemoptysis.  CARDIOVASCULAR: No chest pain, orthopnea, edema.  GASTROINTESTINAL: No nausea, vomiting, diarrhea or abdominal pain.  GENITOURINARY: No dysuria, hematuria.  ENDOCRINE: No polyuria, nocturia,  HEMATOLOGY: No anemia, easy bruising or bleeding SKIN: No rash or lesion. MUSCULOSKELETAL: No joint pain or arthritis.   NEUROLOGIC: Positive right-sided facial droop, slurred speech, difficulty swallowing, and numbness of the right upper extremity PSYCHIATRY: No anxiety or depression.   MEDICATIONS AT HOME:  Prior to Admission medications   Medication Sig Start Date End Date Taking? Authorizing Provider  acetaminophen (TYLENOL) 500 MG tablet Take 500 mg by mouth every 6 (six) hours as needed.   Yes [provider]  aspirin 81 MG tablet Take 81 mg by mouth daily.   Yes [provider]  colchicine 0.6 MG tablet Take 0.6 mg by mouth daily.    Yes [provider]  furosemide (LASIX) 40 MG tablet Take 40 mg by mouth daily.  08/05/15  Yes [provider]  ibuprofen (ADVIL,MOTRIN) 200 MG tablet Take 200 mg by mouth every 6 (six) hours as needed.  Yes [provider]  metoprolol tartrate (LOPRESSOR) 25 MG tablet Take 25 mg by mouth 2 (two) times daily.   Yes [provider]  simvastatin (ZOCOR) 20 MG tablet Take 20 mg by mouth daily.   Yes [provider]  warfarin (COUMADIN) 6 MG tablet Take 6 mg by mouth daily.    Yes [provider]  dicyclomine (BENTYL) 10 MG capsule Take 1 capsule (10 mg total) by mouth 3 (three) times daily before meals. Patient not taking: Reported on 10/03/2016 05/14/15 05/28/15  Loney Hering, MD   HYDROcodone-acetaminophen [facility]) 5-325 MG tablet Take 1 tablet by mouth every 4 (four) hours as needed for moderate pain. Patient not taking: Reported on 10/03/2016 07/18/16   Merlyn Lot, MD  lactulose [facility]) 10 GM/15ML solution Take 30 mLs (20 g total) by mouth daily as needed for mild constipation. Patient not taking: Reported on 10/03/2016 05/11/15   Paulette Blanch, MD  oxyCODONE-acetaminophen (ROXICET) 5-325 MG tablet Take 1 tablet by mouth every 4 (four) hours as needed for severe pain. Patient not taking: Reported on 09/24/2016 09/14/15   Paulette Blanch, MD  polyethylene glycol [facility]) packet Take 17 g by mouth daily. Patient not taking: Reported on 10/03/2016 05/14/15   Loney Hering, MD      PHYSICAL EXAMINATION:   VITAL SIGNS: Blood pressure (!) 144/85, pulse 64, temperature 98 F (36.7 C), resp. rate 17, SpO2 99 %.  GENERAL:  66 y.o.-year-old patient lying in the bed with no acute distress.  EYES: Pupils equal, round, reactive to light and accommodation. No scleral icterus. Extraocular muscles intact.  HEENT: Head atraumatic, normocephalic. Oropharynx and nasopharynx clear.  NECK:  Supple, no jugular venous distention. No thyroid enlargement, no tenderness.  LUNGS: Normal breath sounds bilaterally, no wheezing, rales,rhonchi or crepitation. No use of accessory muscles of respiration.  CARDIOVASCULAR: S1, S2 normal. No murmurs, rubs, or gallops.  ABDOMEN: Soft, nontender, nondistended. Bowel sounds present. No organomegaly or mass.  EXTREMITIES: No pedal edema, cyanosis, or clubbing.  NEUROLOGIC: Right-sided facial droop otherwise cranial nerves II through XII grossly intact, strength is diminished on the right upper extremity and bilateral lower extremity. It is 4 out of 5. Reflexes 2+. Diminished sensation in the right face region. PSYCHIATRIC: The patient is alert and oriented x 3.  SKIN: No obvious rash, lesion, or ulcer.   LABORATORY PANEL:   CBC  Recent  Labs Lab 10/03/16 1229  WBC 4.0  HGB 11.9*  HCT 36.6*  PLT 135*  MCV 75.2*  MCH 24.4*  MCHC 32.5  RDW 18.9*  LYMPHSABS 1.1  MONOABS 0.4  EOSABS 0.1  BASOSABS 0.0   ------------------------------------------------------------------------------------------------------------------  Chemistries   Recent Labs Lab 10/03/16 1229  NA 136  K 3.2*  CL 101  CO2 28  GLUCOSE 85  BUN 13  CREATININE 1.11  CALCIUM 8.5*  AST 32  ALT 20  ALKPHOS 72  BILITOT 0.9   ------------------------------------------------------------------------------------------------------------------ estimated creatinine clearance is 87 mL/min (by C-G formula based on SCr of 1.11 mg/dL). ------------------------------------------------------------------------------------------------------------------ No results for input(s): TSH, T4TOTAL, T3FREE, THYROIDAB in the last 72 hours.  Invalid input(s): FREET3   Coagulation profile  Recent Labs Lab 10/03/16 1229  INR 2.54   ------------------------------------------------------------------------------------------------------------------- No results for input(s): DDIMER in the last 72 hours. -------------------------------------------------------------------------------------------------------------------  Cardiac Enzymes  Recent Labs Lab 10/03/16 1229  TROPONINI <0.03   ------------------------------------------------------------------------------------------------------------------ Invalid input(s): POCBNP  ---------------------------------------------------------------------------------------------------------------  Urinalysis    Component Value Date/Time   COLORURINE YELLOW (A) 10/29/2015 1238  APPEARANCEUR CLEAR (A) 10/29/2015 1238   APPEARANCEUR Clear 01/25/2013 1042   LABSPEC 1.020 10/29/2015 1238   LABSPEC 1.026 01/25/2013 1042   PHURINE 6.0 10/29/2015 1238   GLUCOSEU NEGATIVE 10/29/2015 1238   GLUCOSEU Negative 01/25/2013 1042    HGBUR 1+ (A) 10/29/2015 1238   BILIRUBINUR NEGATIVE 10/29/2015 1238   BILIRUBINUR Negative 01/25/2013 1042   KETONESUR NEGATIVE 10/29/2015 1238   PROTEINUR 100 (A) 10/29/2015 1238   UROBILINOGEN 1.0 04/28/2011 1934   NITRITE NEGATIVE 10/29/2015 1238   LEUKOCYTESUR NEGATIVE 10/29/2015 1238   LEUKOCYTESUR Negative 01/25/2013 1042     RADIOLOGY: Ct Head Wo Contrast  Result Date: 10/03/2016 CLINICAL DATA:  Right-sided facial tingling and difficulty speaking. EXAM: CT HEAD WITHOUT CONTRAST TECHNIQUE: Contiguous axial images were obtained from the base of the skull through the vertex without intravenous contrast. COMPARISON:  April 21, 2011 FINDINGS: Brain: No subdural, epidural, or subarachnoid hemorrhage. Encephalomalacia in the left occipital lobe is consistent with a prior infarct. Encephalomalacia in the left frontal lobe has a chronic appearance as well. Scatter white matter changes. No acute cortical ischemia or infarct is identified. Ventricles and sulci are stable. Cerebellum, brainstem, and basal cisterns are normal. No mass effect or midline shift Vascular: Calcified atherosclerosis is seen in the intracranial carotid arteries. Skull: Normal. Negative for fracture or focal lesion. Sinuses/Orbits: No acute finding. Other: None. IMPRESSION: No acute intracranial abnormality identified. No bleed or acute ischemia/ infarct. Chronic infarcts with resulting encephalomalacia in the left frontal and left occipital lobes are identified. Electronically Signed   By: Dorise Bullion III M.D   On: 10/03/2016 13:07    EKG: Orders placed or performed during the hospital encounter of 10/03/16  . ED EKG  . ED EKG    IMPRESSION AND PLAN: Patient is a 66 year old with previous history of CVA  1. Acute CVA Patient does not meet criteria for TPA due to duration of his symptoms more than 24 hours We will admit him Continue aspirin and Coumadin Neurology consult MRI and MRA of the brain, carotid  Dopplers, echocardiogram of the heart  2. History of atrial fibrillation continue Coumadin therapy Continue metoprolol monitor on telemetry  3. Hyperlipidemia continue simvastatin  4. Hypokalemia due to Lasix therapy will replace potassium  5. Nicotine addiction: Smoking cessation provided 4 minutes spent strongly recommended patient stop smoking she will be started on a nicotine replacement patch  All the records are reviewed and case discussed with ED provider. Management plans discussed with the patient, family and they are in agreement.  CODE STATUS: Code Status History    Date Active Date Inactive Code Status Order ID Comments User Context   10/12/2014  9:53 AM 10/14/2014  3:14 PM Full Code [id number]  Gladstone Lighter, MD Inpatient    Advance Directive Documentation     Most Recent Value  Type of Advance Directive  Healthcare Power of Attorney  Pre-existing out of facility DNR order (yellow form or pink MOST form)  -  "MOST" Form in Place?  -       TOTAL TIME TAKING CARE OF THIS PATIENT: 73mnutes.    PDustin FlockM.D on 10/03/2016 at 2:28 PM  Between 7am to 6pm - Pager - [phone]  After 6pm go to [email or web address] - password [email or web address]  Office  [phone] CC: Primary care physician; RTheotis Burrow MD

## 2016-10-03 NOTE — ED Notes (Signed)
Code stroke order put in at 1239, never informed and spoke to RN at 1339 to ask if code stroke, she was going to as MD.  She was not aware that it was a code stroke.

## 2016-10-03 NOTE — Progress Notes (Addendum)
New admission with diagnosis of stroke with slurred speech, dysarthria, parathesias of right side of face, RUE and RLE with NIH 85f6. Failed swallow study with pt made NPO with oral care supplies/education provided. MD notified with new orders to hold po meds. Stroke education began with pt, significant other, sister and other family.  Will continue care. Pt currently off the unit for diagnostic procedures. Pt has dry/intact una boot on RLE with [facility] consult submitted.

## 2016-10-03 NOTE — Progress Notes (Signed)
Telemetry has called x 2 with pt in afib with pauses. Pt asymptomatic and reports he has been told this before. VSS. Will continue to monitor. Carotid US done. MRI pended due to need for IVC filter information. No changes. Magnesium IV infusing.

## 2016-10-03 NOTE — Progress Notes (Addendum)
ANTICOAGULATION CONSULT NOTE - Initial Consult  Pharmacy Consult for warfarin  Indication: pulmonary embolus  No Known Allergies   Vital Signs: Temp: 98 F (36.7 C) (05/20 1344) Temp Source: Oral (05/20 1243) BP: 144/85 (05/20 1243) Pulse Rate: 64 (05/20 1243)  Labs:  Recent Labs  10/03/16 1229  HGB 11.9*  HCT 36.6*  PLT 135*  APTT 42*  LABPROT 27.8*  INR 2.54  CREATININE 1.11  TROPONINI <0.03    Estimated Creatinine Clearance: 87 mL/min (by C-G formula based on SCr of 1.11 mg/dL).   Medical History: Past Medical History:  Diagnosis Date  . Atrial fibrillation ([place])   . Cocaine abuse    + UDS on admission  . Gout current and history of  . History of deep vein thrombosis 2006  . History of tobacco abuse    has smoked for 50 years  . Hypertension   . Pulmonary embolism ([place])   . Stroke [facility])     Assessment: 66 yo male admitted with CVA and PMH of PE and A. Fib. Pharmacy consulted for warfarin dosing and monitoring. INR on admission was  2.54  Home Regimen: warfarin  '6mg'$  daily   DATE  INR  DOSE 5/20  2.54  '6mg'$   Goal of Therapy:  INR 2-3 Monitor platelets by anticoagulation protocol: Yes   Plan:  INR was therapeutic on admission. Will continue home regimen of warfarin '6mg'$  daily. Patient reports already taking warfarin today (5/20). Will start warfarin dosing on 5/21and  Will recheck INR with am labs.   [name] Dupre, PharmD, BCPS Clinical Pharmacist 10/03/2016 2:38 PM

## 2016-10-03 NOTE — ED Provider Notes (Signed)
[facility] Emergency Department Provider Note  ____________________________________________  Time seen: Approximately 1:57 PM  I have reviewed the triage vital signs and the nursing notes.   HISTORY  Chief Complaint Aphasia and Facial Pain   HPI [name] is a 66 y.o. male with a history of pulmonary embolism on warfarin, stroke, smoking, atrial fibrillation who presents for evaluation of new strokelike symptoms. Patient reports right-sided facial droop, slurred speech, difficulty swallowing, and numbness of his right upper extremity since yesterday morning. He was hoping that the symptoms would go away and that is why he didn't come to the emergency room before. Patient does compliance with his warfarin. He also reports that he has had mild difficulty finding words. No difficulty walking, no changes in vision, no headache, no trauma. Patient's symptoms have been severe and constant since yesterday.  Past Medical History:  Diagnosis Date  . Atrial fibrillation ([place])   . Cocaine abuse    + UDS on admission  . Gout current and history of  . History of deep vein thrombosis 2006  . History of tobacco abuse    has smoked for 50 years  . Hypertension   . Pulmonary embolism ([place])   . Stroke [facility])     Patient Active Problem List   Diagnosis Date Noted  . 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 Pneumonia 10/12/2014  . CVA (cerebral infarction) 04/26/2011  . Diabetes mellitus ([place]) 04/26/2011  . 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 Anemia 04/26/2011  . DVT (deep venous thrombosis) ([place]) 04/26/2011    Past Surgical History:  Procedure Laterality Date  . VASCULAR SURGERY     Stent in leg    Prior to Admission medications   Medication Sig Start Date End Date Taking? Authorizing Provider  acetaminophen (TYLENOL) 500 MG tablet Take 500 mg by mouth every 6 (six) hours as needed.   Yes [provider]  aspirin 81 MG tablet Take 81 mg by mouth daily.   Yes [provider]  colchicine 0.6 MG tablet Take 0.6 mg by mouth daily.    Yes [provider]  furosemide (LASIX) 40 MG tablet Take 40 mg by mouth daily.  08/05/15  Yes [provider]  ibuprofen (ADVIL,MOTRIN) 200 MG tablet Take 200 mg by mouth every 6 (six) hours as needed.   Yes [provider]  metoprolol tartrate (LOPRESSOR) 25 MG tablet Take 25 mg by mouth 2 (two) times daily.   Yes [provider]  simvastatin (ZOCOR) 20 MG tablet Take 20 mg by mouth daily.   Yes [provider]  warfarin (COUMADIN) 6 MG tablet Take 6 mg by mouth daily.    Yes [provider]  dicyclomine (BENTYL) 10 MG capsule Take 1 capsule (10 mg total) by mouth 3 (three) times daily before meals. Patient not taking: Reported on 10/03/2016 05/14/15 05/28/15  Loney Hering, MD  HYDROcodone-acetaminophen [facility]) 5-325 MG tablet Take 1 tablet by mouth every 4 (four) hours as needed for moderate pain. Patient not taking: Reported on 10/03/2016 07/18/16   Merlyn Lot, MD  lactulose [facility]) 10 GM/15ML solution Take 30 mLs (20 g total) by mouth daily as needed for mild constipation. Patient not taking: Reported on 10/03/2016 05/11/15   Paulette Blanch, MD  oxyCODONE-acetaminophen (ROXICET) 5-325 MG tablet Take 1 tablet by mouth every 4 (four) hours as needed for severe pain. Patient not taking: Reported on 09/24/2016 09/14/15  Paulette Blanch, MD  polyethylene glycol [facility]) packet Take 17 g by mouth daily. Patient not taking: Reported on 10/03/2016 05/14/15   Loney Hering, MD    Allergies Patient has no known allergies.  Family History  Problem Relation Age of Onset  . Aneurysm Mother   . Diabetes Father     Social History Social History  Substance Use Topics  . Smoking status: Current Every Day Smoker    Packs/day: 0.50    Years: 50.00    Types: Cigarettes  . Smokeless  tobacco: Never Used  . Alcohol use No    Review of Systems  Constitutional: Negative for fever. Eyes: Negative for visual changes. ENT: Negative for sore throat. Neck: No neck pain  Cardiovascular: Negative for chest pain. Respiratory: Negative for shortness of breath. Gastrointestinal: Negative for abdominal pain, vomiting or diarrhea. Genitourinary: Negative for dysuria. Musculoskeletal: Negative for back pain. Skin: Negative for rash. Neurological: Negative for headaches. + Facial droop, weakness of right upper extremity, dysphasia, dysarthria  Psych: No SI or HI  ____________________________________________   PHYSICAL EXAM:  VITAL SIGNS: ED Triage Vitals [10/03/16 1226]  Enc Vitals Group     BP (!) 176/101     Pulse Rate 71     Resp 16     Temp 98.2 F (36.8 C)     Temp Source Oral     SpO2 99 %     Weight      Height      Head Circumference      Peak Flow      Pain Score      Pain Loc      Pain Edu?      Excl. in [place]?     Constitutional: Alert and oriented. Well appearing and in no apparent distress. HEENT:      Head: Normocephalic and atraumatic.         Eyes: Conjunctivae are normal. Sclera is non-icteric.       Mouth/Throat: Mucous membranes are moist.       Neck: Supple with no signs of meningismus. Cardiovascular: Regular rate and rhythm. No murmurs, gallops, or rubs. 2+ symmetrical distal pulses are present in all extremities. No JVD. Respiratory: Normal respiratory effort. Lungs are clear to auscultation bilaterally. No wheezes, crackles, or rhonchi.  Gastrointestinal: Soft, non tender, and non distended with positive bowel sounds. No rebound or guarding. Genitourinary: No CVA tenderness. Musculoskeletal: Nontender with normal range of motion in all extremities. No edema, cyanosis, or erythema of extremities. Neurologic: Slurred speech, significant dysarthria A & O x3, PERRL, no nystagmus, R facial droop sparing the forehead, motor testing reveals  good tone and bulk throughout. There is no evidence of pronator drift. Dysmetria to FNF on RUE. Muscle strength is 5/5 throughout. Sensory examination is intact. Gait deferred Skin: Skin is warm, dry and intact. No rash noted. Psychiatric: Mood and affect are normal. Speech and behavior are normal.  NIH Stroke Scale  Interval: Baseline Time: 2:06 PM Person Administering Scale: [place] stroke scale items in the order listed. Record performance in each category after each subscale exam. Do not go back and change scores. Follow directions provided for each exam technique. Scores should reflect what the patient does, not what the clinician thinks the patient can do. The clinician should record answers while administering the exam and work quickly. Except where indicated, the patient should not be coached (i.e., repeated requests to patient to make a special effort).   1a  Level of consciousness: 0=alert; keenly responsive  1b. LOC questions:  0=Performs both tasks correctly  1c. LOC commands: 0=Performs both tasks correctly  2.  Best Gaze: 0=normal  3.  Visual: 0=No visual loss  4. Facial Palsy: 2=Partial paralysis (total or near total paralysis of the lower face)  5a.  Motor left arm: 0=No drift, limb holds 90 (or 45) degrees for full 10 seconds  5b.  Motor right arm: 0=No drift, limb holds 90 (or 45) degrees for full 10 seconds  6a. motor left leg: 0=No drift, limb holds 90 (or 45) degrees for full 10 seconds  6b  Motor right leg:  0=No drift, limb holds 90 (or 45) degrees for full 10 seconds  7. Limb Ataxia: 1=Present in one limb  8.  Sensory: 0=Normal; no sensory loss  9. Best Language:  0=No aphasia, normal  10. Dysarthria: 2=Severe; patient speech is so slurred as to be unintelligible in the absence of or our of proportion to any dysphagia, or is mute/anarthric  11. Extinction and Inattention: 0=No abnormality   Total:   5      ____________________________________________   LABS (all labs ordered are listed, but only abnormal results are displayed)  Labs Reviewed  PROTIME-INR - Abnormal; Notable for the following:       Result Value   Prothrombin Time 27.8 (*)    All other components within normal limits  APTT - Abnormal; Notable for the following:    aPTT 42 (*)    All other components within normal limits  CBC - Abnormal; Notable for the following:    Hemoglobin 11.9 (*)    HCT 36.6 (*)    MCV 75.2 (*)    MCH 24.4 (*)    RDW 18.9 (*)    Platelets 135 (*)    All other components within normal limits  COMPREHENSIVE METABOLIC PANEL - Abnormal; Notable for the following:    Potassium 3.2 (*)    Calcium 8.5 (*)    All other components within normal limits  DIFFERENTIAL  TROPONIN I  GLUCOSE, CAPILLARY  CBG MONITORING, ED   ____________________________________________  EKG  ED ECG REPORT I, Rudene Re, the attending physician, personally viewed and interpreted this ECG.   Atrial fibrillation, rate of rate of 56, normal intervals, left axis deviation, ST depressions on lateral leads, no ST elevation. ST depressions are new when compared to prior EKG   ____________________________________________  RADIOLOGY  Head CT:  No acute intracranial abnormality identified. No bleed or acute ischemia/ infarct. Chronic infarcts with resulting encephalomalacia in the left frontal and left occipital lobes are identified. ____________________________________________   PROCEDURES  Procedure(s) performed: None Procedures Critical Care performed:  None ____________________________________________   INITIAL IMPRESSION / ASSESSMENT AND PLAN / ED COURSE  66 y.o. male with a history of pulmonary embolism on warfarin, stroke, smoking, atrial fibrillation who presents for evaluation of new strokelike symptoms. Last seen normal greater than 24 hours ago. NIH stroke scale 5. Not candidate for tPA.  Exam as listed above. Patient failed swallow screen. EKG showed new ST depressions in the lateral leads with negative troponin and no chest pain. Head CT negative for acute stroke. We'll admit to the hospitalist service.     Pertinent labs & imaging results that were available during my care of the patient were reviewed by me and considered in my medical decision making (see chart for details).    ____________________________________________   FINAL CLINICAL IMPRESSION(S) / ED DIAGNOSES  Final diagnoses:  Cerebrovascular  accident (CVA), unspecified mechanism ([place])      NEW MEDICATIONS STARTED DURING THIS VISIT:  New Prescriptions   No medications on file     Note:  This document was prepared using Dragon voice recognition software and may include unintentional dictation errors.    Alfred Levins, [place], MD 10/03/16 [phone]

## 2016-10-04 ENCOUNTER — Inpatient Hospital Stay: Payer: Medicare Other

## 2016-10-04 ENCOUNTER — Inpatient Hospital Stay (HOSPITAL_COMMUNITY)
Admit: 2016-10-04 | Discharge: 2016-10-04 | Disposition: A | Discharge status: Home / Self Care | Payer: Medicare Other | Attending: Internal Medicine | Admitting: Internal Medicine

## 2016-10-04 DIAGNOSIS — G514 Facial myokymia: Secondary | ICD-10-CM | POA: Diagnosis not present

## 2016-10-04 DIAGNOSIS — R531 Weakness: Secondary | ICD-10-CM | POA: Diagnosis not present

## 2016-10-04 DIAGNOSIS — I340 Nonrheumatic mitral (valve) insufficiency: Secondary | ICD-10-CM

## 2016-10-04 DIAGNOSIS — I639 Cerebral infarction, unspecified: Secondary | ICD-10-CM | POA: Diagnosis not present

## 2016-10-04 LAB — BASIC METABOLIC PANEL
ANION GAP: 8 (ref 5–15)
BUN: 10 mg/dL (ref 6–20)
CO2: 27 mmol/L (ref 22–32)
Calcium: 8.4 mg/dL — ABNORMAL LOW (ref 8.9–10.3)
Chloride: 103 mmol/L (ref 101–111)
Creatinine, Ser: 0.75 mg/dL (ref 0.61–1.24)
GLUCOSE: 93 mg/dL (ref 65–99)
Potassium: 2.9 mmol/L — ABNORMAL LOW (ref 3.5–5.1)
SODIUM: 138 mmol/L (ref 135–145)

## 2016-10-04 LAB — LIPID PANEL
CHOLESTEROL: 129 mg/dL (ref 0–200)
HDL: 41 mg/dL (ref >40)
LDL Cholesterol: 70 mg/dL (ref 0–99)
Total CHOL/HDL Ratio: 3.1 RATIO
Triglycerides: 89 mg/dL (ref <150)
VLDL: 18 mg/dL (ref 0–40)

## 2016-10-04 LAB — PROTIME-INR
INR: 2.3
Prothrombin Time: 25.7 seconds — ABNORMAL HIGH (ref 11.4–15.2)

## 2016-10-04 MED ORDER — ASPIRIN 81 MG PO CHEW
81.0000 mg | CHEWABLE_TABLET | Freq: Every day | ORAL | Status: DC
  Administered 2016-10-04 – 2016-10-05 (×2): 81 mg via ORAL
  Filled 2016-10-04 (×2): qty 1

## 2016-10-04 MED ORDER — POTASSIUM CHLORIDE CRYS ER 20 MEQ PO TBCR
40.0000 meq | EXTENDED_RELEASE_TABLET | Freq: Two times a day (BID) | ORAL | Status: AC
  Administered 2016-10-04 (×2): 40 meq via ORAL
  Filled 2016-10-04 (×2): qty 2

## 2016-10-04 MED ORDER — MAGNESIUM OXIDE 400 (241.3 MG) MG PO TABS
400.0000 mg | ORAL_TABLET | Freq: Every day | ORAL | Status: AC
  Administered 2016-10-04: 11:00:00 400 mg via ORAL
  Filled 2016-10-04: qty 1

## 2016-10-04 NOTE — Progress Notes (Signed)
ANTICOAGULATION CONSULT NOTE - FOLLOW UP  Consult  Pharmacy Consult for warfarin  Indication: pulmonary embolus  No Known Allergies   Vital Signs: Temp: 98.4 F (36.9 C) (05/21 0207) Temp Source: Oral (05/21 0852) BP: 169/109 (05/21 0950) Pulse Rate: 72 (05/21 0551)  Labs:  Recent Labs  10/03/16 1229 10/04/16 0438  HGB 11.9*  --   HCT 36.6*  --   PLT 135*  --   APTT 42*  --   LABPROT 27.8* 25.7*  INR 2.54 2.30  CREATININE 1.11  --   TROPONINI <0.03  --     Estimated Creatinine Clearance: 85.7 mL/min (by C-G formula based on SCr of 1.11 mg/dL).   Medical History: Past Medical History:  Diagnosis Date  . Atrial fibrillation ([place])   . Cocaine abuse    + UDS on admission  . Gout current and history of  . History of deep vein thrombosis 2006  . History of tobacco abuse    has smoked for 50 years  . Hypertension   . Pulmonary embolism ([place])   . Stroke [facility])     Assessment: 66 yo male admitted with CVA and PMH of PE and A. Fib. Pharmacy consulted for warfarin dosing and monitoring. INR on admission was  2.54  Home Regimen: warfarin  '6mg'$  daily   DATE  INR  DOSE 5/20  2.54  '6mg'$  5/21                 2.30                    Goal of Therapy:  INR 2-3 Monitor platelets by anticoagulation protocol: Yes   Plan:  INR therapeutic; however decreased slightly. Will give home dose of warfarin 6 mg PO daily. Will recheck INR with am labs.   Larene Beach, PharmD, BCPS Clinical Pharmacist 10/04/2016 9:56 AM

## 2016-10-04 NOTE — Evaluation (Signed)
Physical Therapy Evaluation Patient Details Name: [name] MRN: [id number] DOB: 05/29/1950 Today's Date: 10/04/2016   History of Present Illness  Pt 66yo male pt with known hx of CVA with residual dysphagia, gout, hx DVT and PE on Coumadin, A-fib, HTN, DM, RLE non healing ulcer who presented to ED on 5/20 with aphasia and right facial droop.  Clinical Impression  Upon evaluation, patient alert and oriented; follows all commands and demonstrates good safety awareness/insight.  Mild delay in speed of R UE activation with mild paresthesia reported in R forearm; otherwise, strength and ROM grossly WFL for basic transfers and mobility.  Demonstrates ability to complete bed mobility indep; sit/stand, basic transfers and gait (400') without assist device, mod indep.  Good trunk rotation, arm swing and overall gait mechanics without buckling, LOB or safety concern.  Modified DGI 11/12, indicative of minimal/no fall risk with functional activities.  Patient reports feeling at baseline level of functional ability for all transfers/gait; girlfriend at bedside verifies. Does demonstrate mild dysarthria and word-finding difficulties; reviewed strategies for management.  Also reviewed importance of awareness of R UE function/position, especially with bimanual tasks. Patient voiced undersatnding of all information provided. No other acute PT needs identified at this time.  Patient safe for discharge home when medically appropriate. Will complete initial order; please re-consult should needs change.     Follow Up Recommendations No PT follow up    Equipment Recommendations       Recommendations for Other Services       Precautions / Restrictions Precautions Precautions: Fall Restrictions Weight Bearing Restrictions: No      Mobility  Bed Mobility Overal bed mobility: Independent Bed Mobility: Supine to Sit;Sit to Supine     Supine to sit: Min guard Sit to supine: Min guard   General  bed mobility comments: good confidence, no LOB  Transfers Overall transfer level: Modified independent Equipment used: None Transfers: Sit to/from Stand Sit to Stand: Min guard         General transfer comment: good LE strength/power; no use of UEs required to complete  Ambulation/Gait Ambulation/Gait assistance: Supervision;Modified independent (Device/Increase time) Ambulation Distance (Feet): 400 Feet Assistive device: None       General Gait Details: reciprocal stepping pattern with good trunk rotation and arm swing, no buckling or LOB; able to complete dynamic gait components without difficulty, LOB or safety concern.  Stairs            Wheelchair Mobility    Modified Rankin (Stroke Patients Only)       Balance Overall balance assessment: Modified Independent Sitting-balance support: No upper extremity supported;Feet supported Sitting balance-Leahy Scale: Good     Standing balance support: During functional activity;Single extremity supported Standing balance-Leahy Scale: Good Standing balance comment: stood at sink with L hand supported on counter while pt brushed his teeth                 Standardized Balance Assessment Standardized Balance Assessment :  (Modified DGI, 11/12, minimal fall risk appreciated)           Pertinent Vitals/Pain Pain Assessment: No/denies pain    Home Living Family/patient expects to be discharged to:: Private residence Living Arrangements: Alone Available Help at Discharge: Family;Available PRN/intermittently Type of Home: Apartment Home Access: Level entry     Home Layout: One level Home Equipment: Cane - single point      Prior Function Level of Independence: Independent with assistive device(s)         Comments:  pt reports independent with ADL, uses SPC, girlfriend cooks/cleans for him; denies fall history; + driving.     Hand Dominance   Dominant Hand: Right    Extremity/Trunk Assessment    Upper Extremity Assessment Upper Extremity Assessment: Overall WFL for tasks assessed (mild delay in speed of activation to R UE, mild paresthesia R forearm; otherwise, grossly WFL) RUE Deficits / Details: slight strength difference, R shoulder flexion 4+/5, all else 5/5, intact sensation    Lower Extremity Assessment Lower Extremity Assessment: Overall WFL for tasks assessed (grossly 4+ to 5/5 throughout; sensation fully intact)    Cervical / Trunk Assessment Cervical / Trunk Assessment: Normal  Communication   Communication:  (dysarthric, mild word-finding difficulty at times)  Cognition Arousal/Alertness: Awake/alert Behavior During Therapy: WFL for tasks assessed/performed Overall Cognitive Status: Within Functional Limits for tasks assessed                                        General Comments General comments (skin integrity, edema, etc.): RLE wrapped for non healing ulcer, dressing intact    Exercises     Assessment/Plan    PT Assessment Patent does not need any further PT services  PT Problem List         PT Treatment Interventions      PT Goals (Current goals can be found in the Care Plan section)  Acute Rehab PT Goals Patient Stated Goal: go home PT Goal Formulation: All assessment and education complete, DC therapy    Frequency     Barriers to discharge        Co-evaluation               AM-PAC PT "6 Clicks" Daily Activity  Outcome Measure Difficulty turning over in bed (including adjusting bedclothes, sheets and blankets)?: None Difficulty moving from lying on back to sitting on the side of the bed? : None Difficulty sitting down on and standing up from a chair with arms (e.g., wheelchair, bedside commode, etc,.)?: None Help needed moving to and from a bed to chair (including a wheelchair)?: None Help needed walking in hospital room?: None Help needed climbing 3-5 steps with a railing? : A Little 6 Click Score: 23    End of  Session Equipment Utilized During Treatment: Gait belt   Patient left: in chair;with call bell/phone within reach;with chair alarm set;with nursing/sitter in room;with family/visitor present Nurse Communication: Mobility status PT Visit Diagnosis: Hemiplegia and hemiparesis Hemiplegia - Right/Left: Right Hemiplegia - dominant/non-dominant: Dominant Hemiplegia - caused by: Cerebral infarction    Time: 1550-1621 PT Time Calculation (min) (ACUTE ONLY): 31 min   Charges:   PT Evaluation $PT Eval Low Complexity: 1 Procedure     PT G Codes:        R[place]ker [place] H. Owens Shark, PT, DPT, NCS 10/04/16, 4:47 PM [phone]

## 2016-10-04 NOTE — Progress Notes (Addendum)
[place] [facility] [place] NAME: [name]    MR#:  [id number]  DATE OF BIRTH:  12-Feb-1951  SUBJECTIVE:   Patient with aphasia and right-sided weakness  REVIEW OF SYSTEMS:    Review of Systems  Constitutional: Negative for fever, chills weight loss HENT: Negative for ear pain, nosebleeds, congestion, facial swelling, rhinorrhea, neck pain, neck stiffness and ear discharge.   Respiratory: Negative for cough, shortness of breath, wheezing  Cardiovascular: Negative for chest pain, palpitations and leg swelling.  Gastrointestinal: Negative for heartburn, abdominal pain, vomiting, diarrhea or consitpation Genitourinary: Negative for dysuria, urgency, frequency, hematuria Musculoskeletal: Negative for back pain or joint pain Neurological: Negative for dizziness, seizures, syncope, focal weakness,  numbness and headaches.  Positive for aphasia and right-sided weakness Hematological: Does not bruise/bleed easily.  Psychiatric/Behavioral: Negative for hallucinations, confusion, dysphoric mood    Tolerating Diet:NPO did not pass swallow evaluation      DRUG ALLERGIES:  No Known Allergies  VITALS:  Blood pressure (!) 169/109, pulse 72, temperature 98.4 F (36.9 C), temperature source Oral, resp. rate 16, height '6\' 2"'$  (1.88 m), weight 111.3 kg (245 lb 6.4 oz), SpO2 99 %.  PHYSICAL EXAMINATION:  Constitutional: Appears well-developed and well-nourished. No distress. HENT: Normocephalic. [name] Oropharynx is clear and moist.  Eyes: Conjunctivae and EOM are normal. PERRLA, no scleral icterus.  Neck: Normal ROM. Neck supple. No JVD. No tracheal deviation. CVS: RRR, S1/S2 +, no murmurs, no gallops, no carotid bruit.  Pulmonary: Effort and breath sounds normal, no stridor, rhonchi, wheezes, rales.  Abdominal: Soft. BS +,  no distension, tenderness, rebound or guarding.  Musculoskeletal: Normal range of motion. 1+ edema and no tenderness.  Neuro:  Alert.Right facial droop with aphasia Skin: right leg wrapped Psychiatric: Normal mood and affect.      LABORATORY PANEL:   CBC  Recent Labs Lab 10/03/16 1229  WBC 4.0  HGB 11.9*  HCT 36.6*  PLT 135*   ------------------------------------------------------------------------------------------------------------------  Chemistries   Recent Labs Lab 10/03/16 1229 10/03/16 1236  NA 136  --   K 3.2*  --   CL 101  --   CO2 28  --   GLUCOSE 85  --   BUN 13  --   CREATININE 1.11  --   CALCIUM 8.5*  --   MG  --  1.5*  AST 32  --   ALT 20  --   ALKPHOS 72  --   BILITOT 0.9  --    ------------------------------------------------------------------------------------------------------------------  Cardiac Enzymes  Recent Labs Lab 10/03/16 1229  TROPONINI <0.03   ------------------------------------------------------------------------------------------------------------------  RADIOLOGY:  Ct Head Wo Contrast  Result Date: 10/03/2016 CLINICAL DATA:  Right-sided facial tingling and difficulty speaking. EXAM: CT HEAD WITHOUT CONTRAST TECHNIQUE: Contiguous axial images were obtained from the base of the skull through the vertex without intravenous contrast. COMPARISON:  April 21, 2011 FINDINGS: Brain: No subdural, epidural, or subarachnoid hemorrhage. Encephalomalacia in the left occipital lobe is consistent with a prior infarct. Encephalomalacia in the left frontal lobe has a chronic appearance as well. Scatter white matter changes. No acute cortical ischemia or infarct is identified. Ventricles and sulci are stable. Cerebellum, brainstem, and basal cisterns are normal. No mass effect or midline shift Vascular: Calcified atherosclerosis is seen in the intracranial carotid arteries. Skull: Normal. Negative for fracture or focal lesion. Sinuses/Orbits: No acute finding. Other: None. IMPRESSION: No acute intracranial abnormality identified. No bleed or acute ischemia/ infarct.  Chronic infarcts with resulting encephalomalacia in  the left frontal and left occipital lobes are identified. Electronically Signed   By: Dorise Bullion III M.D   On: 10/03/2016 13:07   US Carotid Bilateral (at [facility] Only)  Result Date: 10/04/2016 CLINICAL DATA:  CVA. EXAM: BILATERAL CAROTID DUPLEX ULTRASOUND TECHNIQUE: Pearline Cables scale imaging, color Doppler and duplex ultrasound were performed of bilateral carotid and vertebral arteries in the neck. COMPARISON:  CT 10/03/2016. MRI 04/22/2011. Carotid ultrasound 04/21/2011. FINDINGS: Criteria: Quantification of carotid stenosis is based on velocity parameters that correlate the residual internal carotid diameter with NASCET-based stenosis levels, using the diameter of the distal internal carotid lumen as the denominator for stenosis measurement. The following velocity measurements were obtained: RIGHT ICA:  82/34 cm/sec CCA:  62/83 cm/sec SYSTOLIC ICA/CCA RATIO:  1.0 DIASTOLIC ICA/CCA RATIO:  2.6 ECA:  51 cm/sec LEFT ICA:  52/22 cm/sec CCA:  66/29 cm/sec SYSTOLIC ICA/CCA RATIO:  0.7 DIASTOLIC ICA/CCA RATIO:  1.5 ECA:  109 cm/sec RIGHT CAROTID ARTERY: Scattered plaque distal common carotid, carotid bifurcation, proximal ICA. No flow limiting stenosis. RIGHT VERTEBRAL ARTERY:  Patent with antegrade flow. LEFT CAROTID ARTERY: Scattered plaque distal common carotid, carotid bifurcation, proximal ICA. No flow limiting stenosis. LEFT VERTEBRAL ARTERY:  Patent with antegrade flow. IMPRESSION: 1. Scattered plaque distal common carotid, carotid bifurcation, proximal ICA noted bilaterally. Degree of stenosis less than 50% bilaterally. 2. Vertebrals are patent with antegrade flow. Electronically Signed   By: Marcello Moores  Register   On: 10/04/2016 06:45     ASSESSMENT AND PLAN:    66 year old male with history of PE on Coumadin, atrial fibrillation who presents with  aphasia and right facial droop.  1. Acute CVA with right facial droop and  aphasia Follow-up on PT,  OT, speech and neurology recommendations Patient is nothing by mouth as he did not pass swallow evaluation Continue aspirin and Coumadin Follow-up on CVA workup including MRI/MRA brain and echocardiogram Carotid Doppler as above without hemodynamically significant stenosis Continue labetalol with parameters for blood pressure 2. PAF: Continue Coumadin and metoprolol  3. Hyperlipidemia: Continue simvastatin LDL 70  4. Essential hypertension: Due to acute CVA will allow brain perfusion Continue when necessary labetalol with parameters  5.Tobacco dependence: Patient is encouraged to quit smoking. Counseling was provided for 4 minutes. 6. Electrolyte abnormalities: These will be repleted and recheck in a.m.   7. Right lower extremity wound: Wound care consult to rewrap wound. Management plans discussed with the patient and he is in agreement.  CODE STATUS: full  TOTAL TIME TAKING CARE OF THIS PATIENT: 30 minutes.     POSSIBLE D/C tomorrow, DEPENDING ON CLINICAL CONDITION.   Ria Redcay M.D on 10/04/2016 at 9:55 AM  Between 7am to 6pm - Pager - [phone] After 6pm go to [email or web address] - password [email or web address] [facility]  Office  [phone]  CC: Primary care physician; Theotis Burrow, MD  Note: This dictation was prepared with Dragon dictation along with smaller phrase technology. Any transcriptional errors that result from this process are unintentional.

## 2016-10-04 NOTE — Consult Note (Addendum)
[facility] Nurse wound consult note Reason for Consult: Consult requested to change right leg Una boot.  Pt states he is followed by the outpatient wound care center and dressings are changed Q week.  Removed previous dressings to assess right leg. 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 Periwound: Dry scaly skin surrounding wound Dressing procedure/placement/frequency: Continue present plan of care with hydroferra blue over the wound, then [place] boot and coban applied.  WOCN team will plan to change next Monday if patient is still in the hospital at that time.  He should return to the outpatient wound care center for follow-up after discharge.  He denies further questions at this time. Please re-consult if further assistance is needed.  Thank-you,  Julien Girt MSN, [place], [place], [place], [place]

## 2016-10-04 NOTE — Consult Note (Addendum)
Referring Physician: Mody    Chief Complaint: Right sided weakness  HPI: [name] is an 66 y.o. male with a history of stroke and residual right sided weakness and aphasia who presents with complaints of right-sided facial droop, slurred speech, difficulty swallowing, and numbness of his right upper extremity since 5/19.  Initial NIHSS of 5.  Date last known well: Date: 10/02/2016 Time last known well: Time: 00:00 tPA Given: No: Outside time window  Past Medical History:  Diagnosis Date  . Atrial fibrillation ([place])   . Cocaine abuse    + UDS on admission  . Gout current and history of  . History of deep vein thrombosis 2006  . History of tobacco abuse    has smoked for 50 years  . Hypertension   . Pulmonary embolism ([place])   . Stroke [facility])     Past Surgical History:  Procedure Laterality Date  . VASCULAR SURGERY     Stent in leg    Family History  Problem Relation Age of Onset  . Aneurysm Mother   . Diabetes Father    Social History:  reports that he has been smoking Cigarettes.  He has a 25.00 pack-year smoking history. He has never used smokeless tobacco. He reports that he does not drink alcohol or use drugs.  Allergies: No Known Allergies  Medications:  I have reviewed the patient's current medications. Prior to Admission:  Prescriptions Prior to Admission  Medication Sig Dispense Refill Last Dose  . acetaminophen (TYLENOL) 500 MG tablet Take 500 mg by mouth every 6 (six) hours as needed.   prn at prn  . aspirin 81 MG tablet Take 81 mg by mouth daily.   10/03/2016 at am  . colchicine 0.6 MG tablet Take 0.6 mg by mouth daily.    10/03/2016 at am  . furosemide (LASIX) 40 MG tablet Take 40 mg by mouth daily.    10/03/2016 at am  . ibuprofen (ADVIL,MOTRIN) 200 MG tablet Take 200 mg by mouth every 6 (six) hours as needed.   prn at prn  . metoprolol tartrate (LOPRESSOR) 25 MG tablet Take 25 mg by mouth 2 (two) times daily.   10/03/2016 at am  . simvastatin (ZOCOR)  20 MG tablet Take 20 mg by mouth daily.   10/03/2016 at am  . warfarin (COUMADIN) 6 MG tablet Take 6 mg by mouth daily.    10/03/2016 at am  . dicyclomine (BENTYL) 10 MG capsule Take 1 capsule (10 mg total) by mouth 3 (three) times daily before meals. (Patient not taking: Reported on 10/03/2016) 20 capsule 0 Completed Course at Unknown time  . HYDROcodone-acetaminophen (NORCO) 5-325 MG tablet Take 1 tablet by mouth every 4 (four) hours as needed for moderate pain. (Patient not taking: Reported on 10/03/2016) 3 tablet 0 Completed Course at Unknown time  . lactulose (CHRONULAC) 10 GM/15ML solution Take 30 mLs (20 g total) by mouth daily as needed for mild constipation. (Patient not taking: Reported on 10/03/2016) 120 mL 0 Completed Course at Unknown time  . oxyCODONE-acetaminophen (ROXICET) 5-325 MG tablet Take 1 tablet by mouth every 4 (four) hours as needed for severe pain. (Patient not taking: Reported on 09/24/2016) 15 tablet 0 Completed Course at Unknown time  . polyethylene glycol (MIRALAX) packet Take 17 g by mouth daily. (Patient not taking: Reported on 10/03/2016) 14 each 0 Completed Course at Unknown time   Scheduled: . aspirin  81 mg Oral Daily  . colchicine  0.6 mg Oral Daily  .  nicotine  21 mg Transdermal Daily  . polyethylene glycol  17 g Oral Daily  . potassium chloride  40 mEq Oral BID  . simvastatin  20 mg Oral Daily  . sodium chloride flush  3 mL Intravenous Q12H  . warfarin  6 mg Oral Daily  . Warfarin - Pharmacist Dosing Inpatient   Does not apply q1800    ROS: History obtained from the patient  General ROS: negative for - chills, fatigue, fever, night sweats, weight gain or weight loss Psychological ROS: negative for - behavioral disorder, hallucinations, memory difficulties, mood swings or suicidal ideation Ophthalmic ROS: negative for - blurry vision, double vision, eye pain or loss of vision ENT ROS: negative for - epistaxis, nasal discharge, oral lesions, sore throat,  tinnitus or vertigo Allergy and Immunology ROS: negative for - hives or itchy/watery eyes Hematological and Lymphatic ROS: negative for - bleeding problems, bruising or swollen lymph nodes Endocrine ROS: negative for - galactorrhea, hair pattern changes, polydipsia/polyuria or temperature intolerance Respiratory ROS: negative for - cough, hemoptysis, shortness of breath or wheezing Cardiovascular ROS: negative for - chest pain, dyspnea on exertion, edema or irregular heartbeat Gastrointestinal ROS: negative for - abdominal pain, diarrhea, hematemesis, nausea/vomiting or stool incontinence Genito-Urinary ROS: negative for - dysuria, hematuria, incontinence or urinary frequency/urgency Musculoskeletal ROS: negative for - joint swelling or muscular weakness Neurological ROS: as noted in HPI Dermatological ROS: negative for rash and skin lesion changes  Physical Examination: Blood pressure (!) 155/89, pulse (!) 58, temperature 98.5 F (36.9 C), temperature source Oral, resp. rate 16, height '6\' 2"'$  (1.88 m), weight 111.3 kg (245 lb 6.4 oz), SpO2 99 %.  HEENT-  Normocephalic, no lesions, without obvious abnormality.  Normal external eye and conjunctiva.  Normal TM's bilaterally.  Normal auditory canals and external ears. Normal external nose, mucus membranes and septum.  Normal pharynx. Cardiovascular- S1, S2 normal, pulses palpable throughout   Lungs- chest clear, no wheezing, rales, normal symmetric air entry Abdomen- soft, non-tender; bowel sounds normal; no masses,  no organomegaly Extremities- BLE edema Lymph-no adenopathy palpable Musculoskeletal-no joint tenderness, deformity or swelling Skin-warm and dry, no hyperpigmentation, vitiligo, or suspicious lesions  Neurological Examination   Mental Status: Alert, oriented, thought content appropriate.  Expressive aphasia.  Able to follow 3 step commands with reinforcement. Cranial Nerves: II: Discs flat bilaterally; Visual fields grossly  normal, pupils equal, round, reactive to light and accommodation III,IV, VI: ptosis not present, extra-ocular motions intact bilaterally V,VII: decrease in right NLF, facial light touch sensation decreased on the right VIII: hearing normal bilaterally IX,X: gag reflex present XI: bilateral shoulder shrug XII: midline tongue extension Motor: Right : Upper extremity   5-/5    Left:     Upper extremity   5/5  Lower extremity   5-/5     Lower extremity   5-/5 Tone and bulk:normal tone throughout; no atrophy noted Sensory: Pinprick and light touch decreased on the right Deep Tendon Reflexes: 2+ in the upper extremities and absent in the lower extremities Plantars: Right: upgoing   Left: mute Cerebellar: Normal finger-to-nose and normal heel-to-shin testing bilaterally Gait: not tested due to safety concerns   Laboratory Studies:  Basic Metabolic Panel:  Recent Labs Lab 10/03/16 1229 10/03/16 1236 10/04/16 0438  NA 136  --  138  K 3.2*  --  2.9*  CL 101  --  103  CO2 28  --  27  GLUCOSE 85  --  93  BUN 13  --  10  CREATININE 1.11  --  0.75  CALCIUM 8.5*  --  8.4*  MG  --  1.5*  --     Liver Function Tests:  Recent Labs Lab 10/03/16 1229  AST 32  ALT 20  ALKPHOS 72  BILITOT 0.9  PROT 7.0  ALBUMIN 3.5   No results for input(s): LIPASE, AMYLASE in the last 168 hours. No results for input(s): AMMONIA in the last 168 hours.  CBC:  Recent Labs Lab 10/03/16 1229  WBC 4.0  NEUTROABS 2.4  HGB 11.9*  HCT 36.6*  MCV 75.2*  PLT 135*    Cardiac Enzymes:  Recent Labs Lab 10/03/16 1229  TROPONINI <0.03    BNP: Invalid input(s): POCBNP  CBG:  Recent Labs Lab 10/03/16 1358  GLUCAP 80    Microbiology: Results for orders placed or performed during the hospital encounter of 09/14/16  Aerobic Culture (superficial specimen)     Status: Abnormal   Collection Time: 09/14/16  8:40 AM  Result Value Ref Range Status   Specimen Description LEG  Final    Special Requests NONE  Final   Gram Stain   Final    RARE WBC PRESENT,BOTH PMN AND MONONUCLEAR RARE GRAM POSITIVE COCCI IN PAIRS RARE GRAM NEGATIVE RODS Performed at [facility], [place] [address], [place], [place] [address]    Culture MULTIPLE ORGANISMS PRESENT, NONE PREDOMINANT (A)  Final   Report Status 09/16/2016 FINAL  Final    Coagulation Studies:  Recent Labs  10/03/16 1229 10/04/16 0438  LABPROT 27.8* 25.7*  INR 2.54 2.30    Urinalysis: No results for input(s): COLORURINE, LABSPEC, PHURINE, GLUCOSEU, HGBUR, BILIRUBINUR, KETONESUR, PROTEINUR, UROBILINOGEN, NITRITE, LEUKOCYTESUR in the last 168 hours.  Invalid input(s): APPERANCEUR  Lipid Panel:    Component Value Date/Time   CHOL 129 10/04/2016 0438   CHOL 124 06/06/2012 0445   TRIG 89 10/04/2016 0438   TRIG 74 06/06/2012 0445   HDL 41 10/04/2016 0438   HDL 53 06/06/2012 0445   CHOLHDL 3.1 10/04/2016 0438   VLDL 18 10/04/2016 0438   VLDL 15 06/06/2012 0445   LDLCALC 70 10/04/2016 0438   LDLCALC 56 06/06/2012 0445    HgbA1C: No results found for: HGBA1C  Urine Drug Screen:     Component Value Date/Time   LABOPIA NONE DETECTED 10/03/2016 1458   COCAINSCRNUR NONE DETECTED 10/03/2016 1458   LABBENZ NONE DETECTED 10/03/2016 1458   AMPHETMU NONE DETECTED 10/03/2016 1458   THCU NONE DETECTED 10/03/2016 1458   LABBARB NONE DETECTED 10/03/2016 1458    Alcohol Level: No results for input(s): ETH in the last 168 hours.  Other results: EKG: sinus bradycardia at 56 bpm with premature supraventricular complexes.  Imaging: Ct Head Wo Contrast  Result Date: 10/03/2016 CLINICAL DATA:  Right-sided facial tingling and difficulty speaking. EXAM: CT HEAD WITHOUT CONTRAST TECHNIQUE: Contiguous axial images were obtained from the base of the skull through the vertex without intravenous contrast. COMPARISON:  April 21, 2011 FINDINGS: Brain: No subdural, epidural, or subarachnoid hemorrhage. Encephalomalacia in the  left occipital lobe is consistent with a prior infarct. Encephalomalacia in the left frontal lobe has a chronic appearance as well. Scatter white matter changes. No acute cortical ischemia or infarct is identified. Ventricles and sulci are stable. Cerebellum, brainstem, and basal cisterns are normal. No mass effect or midline shift Vascular: Calcified atherosclerosis is seen in the intracranial carotid arteries. Skull: Normal. Negative for fracture or focal lesion. Sinuses/Orbits: No acute finding. Other: None. IMPRESSION: No acute intracranial  abnormality identified. No bleed or acute ischemia/ infarct. Chronic infarcts with resulting encephalomalacia in the left frontal and left occipital lobes are identified. Electronically Signed   By: Dorise Bullion III M.D   On: 10/03/2016 13:07   US Carotid Bilateral (at [facility] Only)  Result Date: 10/04/2016 CLINICAL DATA:  CVA. EXAM: BILATERAL CAROTID DUPLEX ULTRASOUND TECHNIQUE: Pearline Cables scale imaging, color Doppler and duplex ultrasound were performed of bilateral carotid and vertebral arteries in the neck. COMPARISON:  CT 10/03/2016. MRI 04/22/2011. Carotid ultrasound 04/21/2011. FINDINGS: Criteria: Quantification of carotid stenosis is based on velocity parameters that correlate the residual internal carotid diameter with NASCET-based stenosis levels, using the diameter of the distal internal carotid lumen as the denominator for stenosis measurement. The following velocity measurements were obtained: RIGHT ICA:  82/34 cm/sec CCA:  21/74 cm/sec SYSTOLIC ICA/CCA RATIO:  1.0 DIASTOLIC ICA/CCA RATIO:  2.6 ECA:  51 cm/sec LEFT ICA:  52/22 cm/sec CCA:  71/59 cm/sec SYSTOLIC ICA/CCA RATIO:  0.7 DIASTOLIC ICA/CCA RATIO:  1.5 ECA:  109 cm/sec RIGHT CAROTID ARTERY: Scattered plaque distal common carotid, carotid bifurcation, proximal ICA. No flow limiting stenosis. RIGHT VERTEBRAL ARTERY:  Patent with antegrade flow. LEFT CAROTID ARTERY: Scattered plaque distal common  carotid, carotid bifurcation, proximal ICA. No flow limiting stenosis. LEFT VERTEBRAL ARTERY:  Patent with antegrade flow. IMPRESSION: 1. Scattered plaque distal common carotid, carotid bifurcation, proximal ICA noted bilaterally. Degree of stenosis less than 50% bilaterally. 2. Vertebrals are patent with antegrade flow. Electronically Signed   By: Marcello Moores  Register   On: 10/04/2016 06:45    Assessment: 66 y.o. male presenting with worsening right sided complaints.  With further conversation reports that the right side of his face has been twitching.  Head CT reviewed and shows no acute changes.  Differential includes TIA vs CVA vs seizure.  Further work up recommended.  Carotid dopplers show no evidence of hemodynamically significant stenosis.  Echocardiogram pending.  A1c pending, LDL 70.  On Coumadin with therapeutic INR (2.3).    Stroke Risk Factors - atrial fibrillation, hypertension and smoking  Plan: 1. MRI, MRA  of the brain without contrast 2. PT consult, OT consult, Speech consult 3. Prophylactic therapy-Continue Coumadin 4. NPO until RN stroke swallow screen 5. Telemetry monitoring 6. Frequent neuro checks 7. EEG 8. Smoking cessation counseling   Alexis Goodell, MD Neurology [phone] 10/04/2016, 12:49 PM

## 2016-10-04 NOTE — Progress Notes (Signed)
MEDICATION RELATED CONSULT NOTE - INITIAL   Pharmacy Consult for Electrolytes  Indication: electrolyte management   No Known Allergies  Patient Measurements: Height: '6\' 2"'$  (188 cm) Weight: 245 lb 6.4 oz (111.3 kg) IBW/kg (Calculated) : 82.2 Adjusted Body Weight:   Vital Signs: Temp: 98.4 F (36.9 C) (05/21 0207) Temp Source: Oral (05/21 0852) BP: 169/109 (05/21 0950) Pulse Rate: 72 (05/21 0551) Intake/Output from previous day: 05/20 0701 - 05/21 0700 In: 776.7 [I.V.:626.7; IV Piggyback:150] Out: 640 [Urine:640] Intake/Output from this shift: No intake/output data recorded.  Labs:  Recent Labs  10/03/16 1229 10/03/16 1236  WBC 4.0  --   HGB 11.9*  --   HCT 36.6*  --   PLT 135*  --   APTT 42*  --   CREATININE 1.11  --   MG  --  1.5*  ALBUMIN 3.5  --   PROT 7.0  --   AST 32  --   ALT 20  --   ALKPHOS 72  --   BILITOT 0.9  --    Estimated Creatinine Clearance: 85.7 mL/min (by C-G formula based on SCr of 1.11 mg/dL).   Microbiology: Recent Results (from the past 720 hour(s))  Aerobic Culture (superficial specimen)     Status: Abnormal   Collection Time: 09/14/16  8:40 AM  Result Value Ref Range Status   Specimen Description LEG  Final   Special Requests NONE  Final   Gram Stain   Final    RARE WBC PRESENT,BOTH PMN AND MONONUCLEAR RARE GRAM POSITIVE COCCI IN PAIRS RARE GRAM NEGATIVE RODS Performed at [facility], [address], [place], [place] [address]    Culture MULTIPLE ORGANISMS PRESENT, NONE PREDOMINANT (A)  Final   Report Status 09/16/2016 FINAL  Final    Medical History: Past Medical History:  Diagnosis Date  . Atrial fibrillation ([place])   . Cocaine abuse    + UDS on admission  . Gout current and history of  . History of deep vein thrombosis 2006  . History of tobacco abuse    has smoked for 50 years  . Hypertension   . Pulmonary embolism ([place])   . Stroke [facility])     Medications:  Prescriptions Prior to Admission  Medication  Sig Dispense Refill Last Dose  . acetaminophen (TYLENOL) 500 MG tablet Take 500 mg by mouth every 6 (six) hours as needed.   prn at prn  . aspirin 81 MG tablet Take 81 mg by mouth daily.   10/03/2016 at am  . colchicine 0.6 MG tablet Take 0.6 mg by mouth daily.    10/03/2016 at am  . furosemide (LASIX) 40 MG tablet Take 40 mg by mouth daily.    10/03/2016 at am  . ibuprofen (ADVIL,MOTRIN) 200 MG tablet Take 200 mg by mouth every 6 (six) hours as needed.   prn at prn  . metoprolol tartrate (LOPRESSOR) 25 MG tablet Take 25 mg by mouth 2 (two) times daily.   10/03/2016 at am  . simvastatin (ZOCOR) 20 MG tablet Take 20 mg by mouth daily.   10/03/2016 at am  . warfarin (COUMADIN) 6 MG tablet Take 6 mg by mouth daily.    10/03/2016 at am  . dicyclomine (BENTYL) 10 MG capsule Take 1 capsule (10 mg total) by mouth 3 (three) times daily before meals. (Patient not taking: Reported on 10/03/2016) 20 capsule 0 Completed Course at Unknown time  . HYDROcodone-acetaminophen (NORCO) 5-325 MG tablet Take 1 tablet by mouth  every 4 (four) hours as needed for moderate pain. (Patient not taking: Reported on 10/03/2016) 3 tablet 0 Completed Course at Unknown time  . lactulose (CHRONULAC) 10 GM/15ML solution Take 30 mLs (20 g total) by mouth daily as needed for mild constipation. (Patient not taking: Reported on 10/03/2016) 120 mL 0 Completed Course at Unknown time  . oxyCODONE-acetaminophen (ROXICET) 5-325 MG tablet Take 1 tablet by mouth every 4 (four) hours as needed for severe pain. (Patient not taking: Reported on 09/24/2016) 15 tablet 0 Completed Course at Unknown time  . polyethylene glycol (MIRALAX) packet Take 17 g by mouth daily. (Patient not taking: Reported on 10/03/2016) 14 each 0 Completed Course at Unknown time   Scheduled:  . aspirin  300 mg Rectal Daily  . colchicine  0.6 mg Oral Daily  . magnesium oxide  400 mg Oral Daily  . nicotine  21 mg Transdermal Daily  . polyethylene glycol  17 g Oral Daily  . potassium  chloride  40 mEq Oral BID  . simvastatin  20 mg Oral Daily  . sodium chloride flush  3 mL Intravenous Q12H  . warfarin  6 mg Oral Daily  . Warfarin - Pharmacist Dosing Inpatient   Does not apply q1800    Assessment: Pharmacy to monitor and replete electrolytes as needed per consult K= 3.2; Mag 1.5  Goal of Therapy:    Plan:   Will give KCl 40 mEq PO x 2 doses and Mag Ox 400 mg PO x1. Will recheck labs in am [name] D 10/04/2016,10:05 AM

## 2016-10-04 NOTE — Evaluation (Signed)
Occupational Therapy Evaluation Patient Details Name: [name] MRN: [id number] DOB: 09-05-1950 Today's Date: 10/04/2016    History of Present Illness Pt 66yo male pt with known hx of CVA with residual dysphagia, gout, hx DVT and PE on Coumadin, A-fib, HTN, DM, RLE non healing ulcer who presented to ED on 5/20 with aphasia and right facial droop.   Clinical Impression   Pt seen for OT Evaluation this date. Pt lives alone in level entry apartment and was modified independent at baseline using [facility] for ambulation and admits that girlfriend does the cooking and cleaning, although he does his own laundry. Pt presents with slight differences in coordination and strength with RUE versus LUE. Pt able to perform grooming tasks using R dominant hand with no difficulties. Pt reports intact sensation. BP 177/95 at end of session after standing at sink for grooming tasks for approx 3-5 minutes. No OT follow up recommended at this time. Will benefit from skilled OT services in the hospital to maximize functional independence with self care tasks and functional mobility.     Follow Up Recommendations  No OT follow up    Equipment Recommendations  None recommended by OT    Recommendations for Other Services       Precautions / Restrictions Precautions Precautions: Fall Restrictions Weight Bearing Restrictions: No      Mobility Bed Mobility Overal bed mobility: Needs Assistance Bed Mobility: Supine to Sit;Sit to Supine     Supine to sit: Min guard Sit to supine: Min guard   General bed mobility comments: good confidence, no LOB  Transfers Overall transfer level: Needs assistance Equipment used: None Transfers: Sit to/from Stand Sit to Stand: Min guard         General transfer comment: good confidence, no LOB    Balance Overall balance assessment: Needs assistance Sitting-balance support: No upper extremity supported;Feet supported Sitting balance-Leahy Scale: Good      Standing balance support: During functional activity;Single extremity supported Standing balance-Leahy Scale: Good Standing balance comment: stood at sink with L hand supported on counter while pt brushed his teeth                           ADL either performed or assessed with clinical judgement   ADL Overall ADL's : Needs assistance/impaired Eating/Feeding: NPO Eating/Feeding Details (indicate cue type and reason): NPO at time of evaluation Grooming: Standing;Wash/dry hands;Wash/dry face;Oral care;Supervision/safety Grooming Details (indicate cue type and reason): pt stood at sink with supervision to perform grooming tasks with no difficulty using R hand Upper Body Bathing: Sitting;Set up;Supervision/ safety   Lower Body Bathing: Supervison/ safety;Sitting/lateral leans;Sit to/from stand;Min guard Lower Body Bathing Details (indicate cue type and reason): min guard for transitional movements Upper Body Dressing : Sitting;Set up   Lower Body Dressing: Sit to/from stand;Min guard Lower Body Dressing Details (indicate cue type and reason): min guard for transitional movements Toilet Transfer: Min guard           Functional mobility during ADLs: Min guard General ADL Comments: pt generally min guard level for out of bed self care tasks     Vision Baseline Vision/History: No visual deficits Patient Visual Report: No change from baseline Vision Assessment?: No apparent visual deficits     Perception     Praxis      Pertinent Vitals/Pain Pain Assessment: No/denies pain     Hand Dominance Right   Extremity/Trunk Assessment Upper Extremity Assessment Upper Extremity Assessment: RUE deficits/detail  RUE Deficits / Details: slight strength difference, R shoulder flexion 4+/5, all else 5/5, intact sensation   Lower Extremity Assessment Lower Extremity Assessment: Defer to PT evaluation   Cervical / Trunk Assessment Cervical / Trunk Assessment: Normal    Communication Communication Communication: Other (comment) (slightly mumbled speech, chronic from past stroke)   Cognition Arousal/Alertness: Awake/alert Behavior During Therapy: WFL for tasks assessed/performed Overall Cognitive Status: Within Functional Limits for tasks assessed                                     General Comments  RLE wrapped for non healing ulcer, dressing intact    Exercises     Shoulder Instructions      Home Living Family/patient expects to be discharged to:: Private residence Living Arrangements: Alone Available Help at Discharge: Family Type of Home: Apartment Home Access: Level entry     Home Layout: One level     Bathroom Shower/Tub: Corporate investment banker: Standard Bathroom Accessibility: Yes   Home Equipment: Cane - single point          Prior Functioning/Environment Level of Independence: Independent with assistive device(s)        Comments: pt reports independent with ADL, uses SPC, girlfriend cooks/cleans for him        OT Problem List: Decreased strength;Decreased coordination      OT Treatment/Interventions: Self-care/ADL training;Therapeutic exercise;Therapeutic activities;Neuromuscular education;Energy conservation;DME and/or AE instruction;Patient/family education    OT Goals(Current goals can be found in the care plan section) Acute Rehab OT Goals Patient Stated Goal: go home OT Goal Formulation: With patient Time For Goal Achievement: 10/18/16 Potential to Achieve Goals: Good  OT Frequency: Min 1X/week   Barriers to D/C:            Co-evaluation              AM-PAC PT "6 Clicks" Daily Activity     Outcome Measure Help from another person eating meals?: None (NPO at time of eval) Help from another person taking care of personal grooming?: A Little Help from another person toileting, which includes using toliet, bedpan, or urinal?: A Little Help from another person  bathing (including washing, rinsing, drying)?: A Little Help from another person to put on and taking off regular upper body clothing?: None Help from another person to put on and taking off regular lower body clothing?: A Little 6 Click Score: 20   End of Session Equipment Utilized During Treatment: Gait belt  Activity Tolerance: Patient tolerated treatment well Patient left: in bed;with call bell/phone within reach;with bed alarm set;Other (comment) (with neurologist in room)  OT Visit Diagnosis: Hemiplegia and hemiparesis Hemiplegia - Right/Left: Right Hemiplegia - dominant/non-dominant: Dominant Hemiplegia - caused by: Cerebral infarction                Time: 4132-4401 OT Time Calculation (min): 33 min Charges:  OT General Charges $OT Visit: 1 Procedure OT Evaluation $OT Eval Low Complexity: 1 Procedure OT Treatments $Self Care/Home Management : 8-22 mins G-Codes:     Jeni Salles, MPH, MS, OTR/L ascom [phone] 10/04/16, 1:39 PM

## 2016-10-04 NOTE — Progress Notes (Signed)
OT Cancellation Note  Patient Details Name: Keanu Lesniak MRN: [id number] DOB: [birth year]   Cancelled Treatment:    Reason Eval/Treat Not Completed: Patient at procedure or test/ unavailable. Order received, chart reviewed. Pt with SLP on initial attempt. Will re-attempt OT evaluation at later time as pt is available.  Jeni Salles, MPH, MS, OTR/L ascom [phone] 10/04/16, 9:42 AM

## 2016-10-04 NOTE — Care Management (Signed)
Admitted to this facility with the diagnosis of CVA. Lives alone. Friend is Farrel Gobble [phone]). Last seen Dr. Alene Mires at the [facility] 09/20/16. Last seen Dr, Lucky Cowboy 09/24/16. Goes to the [place] every Tuesday. No home Health. No skilled Facility No home oxygen. Uses a cane to aid in ambulation. Takes care of all basic activities of daily living himself, drives. Last fall was a year ago. Good appetite. Prescriptions are filled at [facility] on [address].  States he has had a stroke about 2 years ago. He was a patient in [facility] and stayed with his sister after discharge from that hospital [place] MSN [place] [facility] [phone]

## 2016-10-04 NOTE — Evaluation (Signed)
Clinical/Bedside Swallow Evaluation Patient Details  Name: [name] MRN: [id number] Date of Birth: 1951-03-04  Today's Date: 10/04/2016 Time: SLP Start Time (ACUTE ONLY): 0930 SLP Stop Time (ACUTE ONLY): 1030 SLP Time Calculation (min) (ACUTE ONLY): 60 min  Past Medical History:  Past Medical History:  Diagnosis Date  . Atrial fibrillation ([place])   . Cocaine abuse    + UDS on admission  . Gout current and history of  . History of deep vein thrombosis 2006  . History of tobacco abuse    has smoked for 50 years  . Hypertension   . Pulmonary embolism ([place])   . Stroke [facility])    Past Surgical History:  Past Surgical History:  Procedure Laterality Date  . VASCULAR SURGERY     Stent in leg   HPI:  Pt is a 66 y.o. male with a known history of Atrial fibrillation, gout, history of DVT, pulmonary embolism, essential hypertension, previous CVA with chronic speech deficits who reports that he is compliant with his medication who is presenting with evaluation for new strokelike symptoms. Patient reports that he started developing right-sided facial droop, slurred speech and difficulty swallowing and numbness of the right upper extremity since yesterday morning. His symptoms persisted therefore came to the emergency room. In the emergency room is noted to have a facial droop. Has some difficulty with finding words. He states that there is no trouble with walking. Denies any changes in vision. CT scan of the head was negative in the emergency room. Currently, pt is alert, talkative and following all instruction. Pt has been NPO and is wanting breakfast. Noted min mumbled speech w/ low volume; inconsistent word finding deficits. Will continue to assess.   Assessment / Plan / Recommendation Clinical Impression  Pt appeared to present w/ adequate oropharyngeal phase swallow function and appears at reduced risk for aspiration following general aspiration precautions. Pt consumed trials of thin  liquids and purees/solids w/ no overt s/s of aspiration noted; clear vocal quality post trials. Oral phase c/b timely bolus management and oral clearing post swallowing; pt is missing few dentition. Pt helped to feed self w/setup given and positioning upright in bed. Recommend a mech soft/regular diet consistency w/ thin liquids; general aspiration precautions; Pills given in Puree - whole for easier swallowing.  SLP Visit Diagnosis: Dysphagia, unspecified (R13.10)    Aspiration Risk   (reduced )    Diet Recommendation  Dysphagia level 3(mech soft) w/ Thin liquids; general aspiration precautions; setup at meals, positioning upright.   Medication Administration: Whole meds with puree    Other  Recommendations Recommended Consults:  (Dietician f/u) Oral Care Recommendations: Oral care BID;Staff/trained caregiver to provide oral care   Follow up Recommendations None      Frequency and Duration  n/a          Prognosis Prognosis for Safe Diet Advancement: Good      Swallow Study   General Date of Onset: 10/03/16 HPI: Pt is a 66 y.o. male with a known history of Atrial fibrillation, gout, history of DVT, pulmonary embolism, essential hypertension, previous CVA with chronic speech deficits who reports that he is compliant with his medication who is presenting with evaluation for new strokelike symptoms. Patient reports that he started developing right-sided facial droop, slurred speech and difficulty swallowing and numbness of the right upper extremity since yesterday morning. His symptoms persisted therefore came to the emergency room. In the emergency room is noted to have a facial droop. Has some difficulty with  finding words. He states that there is no trouble with walking. Denies any changes in vision. CT scan of the head was negative in the emergency room. Currently, pt is alert, talkative and following all instruction. Pt has been NPO and is wanting breakfast. Noted min mumbled speech w/  low volume; inconsistent word finding deficits. Will continue to assess. Type of Study: Bedside Swallow Evaluation Previous Swallow Assessment: none Diet Prior to this Study: Regular;Thin liquids Temperature Spikes Noted: No (wbc not elevated) Respiratory Status: Room air History of Recent Intubation: No Behavior/Cognition: Alert;Cooperative;Pleasant mood Oral Cavity Assessment: Within Functional Limits Oral Care Completed by SLP: Recent completion by staff Oral Cavity - Dentition: Missing dentition (several) Vision: Functional for self-feeding Self-Feeding Abilities: Able to feed self;Needs set up Patient Positioning: Upright in bed Baseline Vocal Quality: Normal;Low vocal intensity (slightly mumbled at times) Volitional Cough: Strong Volitional Swallow: Able to elicit    Oral/Motor/Sensory Function Overall Oral Motor/Sensory Function: Within functional limits   Ice Chips Ice chips: Within functional limits Presentation: Spoon (fed; 2 trials)   Thin Liquid Thin Liquid: Within functional limits Presentation: Cup;Self Fed (does NOT use straws to drink with)    Nectar Thick Nectar Thick Liquid: Not tested   Honey Thick Honey Thick Liquid: Not tested   Puree Puree: Within functional limits Presentation: Self Fed;Spoon (6 trials)   Solid   GO   Solid: Within functional limits Presentation: Self Fed (5 trials)         Mark Kenner, MS, CCC-SLP [name] 10/04/2016,1:49 PM

## 2016-10-05 ENCOUNTER — Ambulatory Visit: Payer: Medicare Other | Admitting: Internal Medicine

## 2016-10-05 DIAGNOSIS — I639 Cerebral infarction, unspecified: Secondary | ICD-10-CM | POA: Diagnosis not present

## 2016-10-05 LAB — HEMOGLOBIN A1C
Hgb A1c MFr Bld: 6.9 % — ABNORMAL HIGH (ref 4.8–5.6)
Mean Plasma Glucose: 151 mg/dL

## 2016-10-05 LAB — ECHOCARDIOGRAM COMPLETE
Height: 74 in
WEIGHTICAEL: 3926.4 [oz_av]

## 2016-10-05 LAB — BASIC METABOLIC PANEL
ANION GAP: 6 (ref 5–15)
BUN: 10 mg/dL (ref 6–20)
CO2: 26 mmol/L (ref 22–32)
Calcium: 8.2 mg/dL — ABNORMAL LOW (ref 8.9–10.3)
Chloride: 104 mmol/L (ref 101–111)
Creatinine, Ser: 0.83 mg/dL (ref 0.61–1.24)
GFR calc Af Amer: >60 mL/min (ref >60)
GLUCOSE: 107 mg/dL — AB (ref 65–99)
POTASSIUM: 3.4 mmol/L — AB (ref 3.5–5.1)
Sodium: 136 mmol/L (ref 135–145)

## 2016-10-05 LAB — PROTIME-INR
INR: 2.5
Prothrombin Time: 27.5 seconds — ABNORMAL HIGH (ref 11.4–15.2)

## 2016-10-05 LAB — MAGNESIUM: Magnesium: 1.6 mg/dL — ABNORMAL LOW (ref 1.7–2.4)

## 2016-10-05 MED ORDER — MAGNESIUM SULFATE 2 GM/50ML IV SOLN
2.0000 g | Freq: Once | INTRAVENOUS | Status: AC
  Administered 2016-10-05: 09:00:00 2 g via INTRAVENOUS
  Filled 2016-10-05: qty 50

## 2016-10-05 MED ORDER — JUVEN PO PACK: 1.0000 | PACK | Freq: Two times a day (BID) | ORAL | Status: DC

## 2016-10-05 MED ORDER — POTASSIUM CHLORIDE CRYS ER 20 MEQ PO TBCR
40.0000 meq | EXTENDED_RELEASE_TABLET | Freq: Once | ORAL | Status: AC
  Administered 2016-10-05: 40 meq via ORAL
  Filled 2016-10-05: qty 2

## 2016-10-05 MED ORDER — JUVEN PO PACK: 1.0000 | PACK | Freq: Two times a day (BID) | ORAL | 0 refills | Status: DC

## 2016-10-05 NOTE — Discharge Instructions (Signed)
Heart healthy, dysphagia 3 diet. Aspiration precaution.

## 2016-10-05 NOTE — Progress Notes (Signed)
ANTICOAGULATION CONSULT NOTE - FOLLOW UP  Consult  Pharmacy Consult for warfarin  Indication: pulmonary embolus  No Known Allergies   Vital Signs: Temp: 97.8 F (36.6 C) (05/22 0908) Temp Source: Oral (05/22 0908) BP: 158/88 (05/22 0908) Pulse Rate: 73 (05/22 0908)  Labs:  Recent Labs  10/03/16 1229 10/04/16 0438 10/05/16 0430 10/05/16 1038  HGB 11.9*  --   --   --   HCT 36.6*  --   --   --   PLT 135*  --   --   --   APTT 42*  --   --   --   LABPROT 27.8* 25.7*  --  27.5*  INR 2.54 2.30  --  2.50  CREATININE 1.11 0.75 0.83  --   TROPONINI <0.03  --   --   --     Estimated Creatinine Clearance: 114.6 mL/min (by C-G formula based on SCr of 0.83 mg/dL).   Medical History: Past Medical History:  Diagnosis Date  . Atrial fibrillation ([place])   . Cocaine abuse    + UDS on admission  . Gout current and history of  . History of deep vein thrombosis 2006  . History of tobacco abuse    has smoked for 50 years  . Hypertension   . Pulmonary embolism ([place])   . Stroke [facility])     Assessment: 66 yo male admitted with CVA and PMH of PE and A. Fib. Pharmacy consulted for warfarin dosing and monitoring. INR on admission was  2.54  Home Regimen: warfarin  '6mg'$  daily   DATE  INR  DOSE 5/20  2.54  '6mg'$  5/21                 2.30                 6 mg 5/22                 2.50        Goal of Therapy:                INR 2-3 Monitor platelets by anticoagulation protocol: Yes   Plan:  INR therapeutic. Continue home regimen   Larene Beach, PharmD, BCPS Clinical Pharmacist 10/05/2016 11:54 AM

## 2016-10-05 NOTE — Progress Notes (Signed)
Initial Nutrition Assessment  DOCUMENTATION CODES:   Obesity unspecified  INTERVENTION:  Recommend Juven po BID to promote wound healing. Each packet contains 80 kcal, 14 grams amino acids. Also contains vitamins essential for wound healing. Discussed recommendation with patient.  Encouraged ongoing intake of adequate calories and protein with meals.  NUTRITION DIAGNOSIS:   Increased nutrient needs related to wound healing as evidenced by estimated needs.  GOAL:   Patient will meet greater than or equal to 90% of their needs  MONITOR:   PO intake, Supplement acceptance, Labs, Weight trends, I & O's  REASON FOR ASSESSMENT:   Consult Assessment of nutrition requirement/status (verbal consult assess for chronic wound)  ASSESSMENT:   66 year old male with PMHx of DVT, HTN, gout, cocaine abuse, Afib, chronic full thickness wound right leg, presented with aphasia and right facial droop found to have acute CVA.   -Patient followed at outpatient wound care center for chronic wound.  Spoke with patient at bedside. He reports his appetite is good and unchanged from baseline. He continues to eat 100% of three meals daily. He enjoys meat and eats a good protein source at each meal. Denies any N/V, abdominal pain, constipation/diarrhea. Denies any difficulty chewing/swallowing, but endorses that dysphagia 3 diet he is on after SLP evaluation makes it easier for him to eat after his stroke. He reports he has had his wound for approximately 3 months now. He is not sure how he got it - reports it appeared one day.   Patient reports he is weight stable. Reports UBW 225 lbs but endorses he has been a higher weight lately (245-250 lbs).  Medications reviewed and include: Miralax, warfarin, magnesium sulfate 2 grams once IV.  Labs reviewed: Potassium 3.4, Magnesium 1.6.  Nutrition-Focused physical exam completed. Findings are no fat depletion, no muscle depletion, and moderate edema.    Patient does not meet criteria for malnutrition.  Diet Order:  DIET DYS 3 Room service appropriate? Yes with Assist; Fluid consistency: Thin Diet - low sodium heart healthy  Skin:  Wound (see comment) (chronic full thickness wound right inner calf)  Last BM:  10/04/2016 - type 3 per chart  Height:   Ht Readings from Last 1 Encounters:  10/03/16 '6\' 2"'$  (1.88 m)    Weight:   Wt Readings from Last 1 Encounters:  10/03/16 245 lb 6.4 oz (111.3 kg)    Ideal Body Weight:  86.4 kg  BMI:  Body mass index is 31.51 kg/m.  Estimated Nutritional Needs:   Kcal:  9480-1655 (MSJ x 1.2-1.3)  Protein:  110-130 grams (1-1.2 grams/kg)  Fluid:  2.3-2.5 L/day  EDUCATION NEEDS:   No education needs identified at this time  Willey Blade, MS, RD, LDN Pager: [phone] After Hours Pager: [phone]

## 2016-10-05 NOTE — Evaluation (Signed)
Speech Language Pathology Evaluation Patient Details Name: Bertel Venard MRN: [id number] DOB: 17-Jun-1950 Today's Date: 10/05/2016 Time: 1000-1100 SLP Time Calculation (min) (ACUTE ONLY): 60 min  Problem List:  Patient Active Problem List   Diagnosis Date Noted  . Facial twitching   . CVA (cerebral vascular accident) ([place]) 10/03/2016  . 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 Pneumonia 10/12/2014  . CVA (cerebral infarction) 04/26/2011  . Diabetes mellitus ([place]) 04/26/2011  . 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 Anemia 04/26/2011  . DVT (deep venous thrombosis) ([place]) 04/26/2011   Past Medical History:  Past Medical History:  Diagnosis Date  . Atrial fibrillation ([place])   . Cocaine abuse    + UDS on admission  . Gout current and history of  . History of deep vein thrombosis 2006  . History of tobacco abuse    has smoked for 50 years  . Hypertension   . Pulmonary embolism ([place])   . Stroke [facility])    Past Surgical History:  Past Surgical History:  Procedure Laterality Date  . VASCULAR SURGERY     Stent in leg   HPI:  Pt is a 66 y.o. male with a known history of Atrial fibrillation, gout, history of DVT, pulmonary embolism, essential hypertension, previous CVA with chronic speech deficits who reports that he is compliant with his medication who is presenting with evaluation for new strokelike symptoms. Patient reports that he started developing right-sided facial droop, slurred speech and difficulty swallowing and numbness of the right upper extremity since yesterday morning. His symptoms persisted therefore came to the emergency room. In the emergency room is noted to have a facial droop. Has some difficulty with finding words. He states that there is no trouble with walking. Denies any changes in vision. CT scan of the head was negative in the emergency room. Currently, pt is alert, talkative and following all instruction. Pt has  been tolerating his oral diet(mech soft) per report. Noted min mumbled speech w/ lower volume; inconsistent dysfluency deficits noted - pt stated this was residual from his previous CVA ~2 years ago.     Assessment / Plan / Recommendation Clinical Impression  Pt appears to present w/ mild+ Expressive Language and Motor Planning deficits much of which appears to be premorbid from previous CVA ~2 years ago. Pt indicated he "couldn't talk at all then" and received lengthy ST services to regain his verbal communication. Pt described that his speech-language abilities are similar to his usual speech and conversation at home; he denied much change in speech/communication w/ this therapist/other staff and family members as of today vs in the past 6 months("since Christmas"). Pt's communication is impacted by inconsistent, motor planning deficts during initiation w/ mild dysfluency noted; min decreased articulation precision (lingual movements) at word/phrase/sentence (not in isolation); and inconsistent hesitations during conversation - unsure if motor planning or word finding issues at higher level as pt had no word finding deficits during naming and description tasks. As pt is feeling his speech and language abilities are mostly at his baseline w/ no gross decline in function today, no immediate skilled ST services indicated upon discharge. Pt stated "my speech is pretty much like it his at home all the time"; and pt is aware of his speech strategies during conversation of slowing down, pausing, and starting over to aid his conversation success. In speaking w/ a family member present, she agreed w/ assessment of his presentation today. Pt will f/u w/ primary  MD upon discharge if he feels his speech and language abilities are declined from his baseline(deficits) when in his home environment/setting. CM and NSG updated on plan; agreed.     SLP Assessment  SLP Recommendation/Assessment: All further Speech Lanaguage  Pathology  needs can be addressed in the next venue of care (if pt indicates need for f/u services) SLP Visit Diagnosis: Dysarthria and anarthria (R47.1);Aphasia (R47.01) (Expressive )    Follow Up Recommendations   (pt will f/u as desired)    Frequency and Duration           SLP Evaluation Cognition  Overall Cognitive Status: Within Functional Limits for tasks assessed (grossly) Arousal/Alertness: Awake/alert Orientation Level: Oriented X4 Attention: Focused;Sustained Focused Attention: Appears intact Sustained Attention: Appears intact Memory: Appears intact Awareness: Appears intact (for tasks) Problem Solving: Appears intact Executive Function: Reasoning;Decision Making Reasoning: Appears intact (w/ tasks) Decision Making: Appears intact (w/ tasks) Behaviors:  (none noted) Safety/Judgment: Impaired Comments: unsure of pt's full awareness of limitations when it comes to decisions re: driving a car - family stated he would decide to drive because of "his presonality and stubbornness". CM made aware.       Comprehension  Auditory Comprehension Overall Auditory Comprehension: Appears within functional limits for tasks assessed Yes/No Questions: Within Functional Limits Commands: Within Functional Limits Conversation: Simple Other Conversation Comments: min impaired by motor planning/expressive skills EffectiveTechniques: Slowed speech;Pausing Reading Comprehension Reading Status: Within funtional limits    Expression Expression Primary Mode of Expression: Verbal Verbal Expression Overall Verbal Expression: Impaired at baseline (motor planning deficits also) Initiation: Impaired Automatic Speech: Name;Social Response;Counting;Day of week;Month of year (WFL) Level of Generative/Spontaneous Verbalization: Word;Phrase;Sentence (min impaired - baseline deficits also) Repetition: No impairment Naming: No impairment Pragmatics: No impairment Interfering Components: Speech  intelligibility (min decreased at times) Effective Techniques:  (slowing down; repeating himself) Non-Verbal Means of Communication:  (n/a) Written Expression Dominant Hand: Right   Oral / Motor  Oral Motor/Sensory Function Overall Oral Motor/Sensory Function: Within functional limits Motor Speech Overall Motor Speech: Impaired at baseline Respiration: Within functional limits Phonation: Normal;Low vocal intensity (min) Resonance: Within functional limits Articulation: Impaired Level of Impairment: Word Intelligibility: Intelligibility reduced Word: 75-100% accurate Phrase: 75-100% accurate Sentence: 75-100% accurate Conversation: 75-100% accurate Motor Planning: Impaired Level of Impairment: Word Motor Speech Errors: Groping for words;Inconsistent Interfering Components: Premorbid status Effective Techniques: Slow rate;Pause;Over-articulate   GO                     Orinda Kenner, [place], CCC-SLP Lavanya Roa 10/05/2016, 12:54 PM

## 2016-10-05 NOTE — Discharge Summary (Signed)
[name] at [place] NAME: [name]    MR#:  [id number]  DATE OF BIRTH:  11-02-1950  DATE OF ADMISSION:  10/03/2016   ADMITTING PHYSICIAN: [name], MD  DATE OF DISCHARGE: 10/05/2016 12:30 PM  PRIMARY CARE PHYSICIAN: [name], MD   ADMISSION DIAGNOSIS:  Cerebrovascular accident (CVA), unspecified mechanism ([name]) [I63.9] DISCHARGE DIAGNOSIS:  Active Problems:   CVA (cerebral vascular accident) ([name])   Facial twitching  SECONDARY DIAGNOSIS:   Past Medical History:  Diagnosis Date  . Atrial fibrillation ([name])   . Cocaine abuse    + UDS on admission  . Gout current and history of  . History of deep vein thrombosis 2006  . History of tobacco abuse    has smoked for 50 years  . Hypertension   . Pulmonary embolism ([name])   . Stroke [name])    [name] COURSE:   66 year old male with history of PE on Coumadin, atrial fibrillation who presents with  aphasia and right facial droop.  1. Right facial droop and aphasia No acute CVA per MRI. No need for PT, OT. Continue aspirin and Coumadin per Dr. Doy Mince. Carotid Doppler as above without hemodynamically significant stenosis  2. PAF: Continue Coumadin and metoprolol  3. Hyperlipidemia: Continue simvastatin LDL 70  4. Essential hypertension: Due to acute CVA will allow brain perfusion Continue when necessary labetalol with parameters  5.Tobacco dependence: Patient is encouraged to quit smoking. Counseling was provided for 4 minutes. 6. Hypokalemia. Given potassium supplement and follow-up BMP as outpatient. Hypomagnesemia. Give IV magnesium and a follow-up level as outpatient.  DISCHARGE CONDITIONS:  Stable, discharge to home today. CONSULTS OBTAINED:   DRUG ALLERGIES:  No Known Allergies DISCHARGE MEDICATIONS:   Allergies as of 10/05/2016   No Known Allergies     Medication List    STOP taking these medications   dicyclomine 10 MG  capsule Commonly known as:  BENTYL     TAKE these medications   acetaminophen 500 MG tablet Commonly known as:  TYLENOL Take 500 mg by mouth every 6 (six) hours as needed.   aspirin 81 MG tablet Take 81 mg by mouth daily.   colchicine 0.6 MG tablet Take 0.6 mg by mouth daily.   furosemide 40 MG tablet Commonly known as:  LASIX Take 40 mg by mouth daily.   HYDROcodone-acetaminophen 5-325 MG tablet Commonly known as:  NORCO Take 1 tablet by mouth every 4 (four) hours as needed for moderate pain.   ibuprofen 200 MG tablet Commonly known as:  ADVIL,MOTRIN Take 200 mg by mouth every 6 (six) hours as needed.   lactulose 10 GM/15ML solution Commonly known as:  CHRONULAC Take 30 mLs (20 g total) by mouth daily as needed for mild constipation.   metoprolol tartrate 25 MG tablet Commonly known as:  LOPRESSOR Take 25 mg by mouth 2 (two) times daily.   nutrition supplement (JUVEN) Pack Take 1 packet by mouth 2 (two) times daily between meals.   oxyCODONE-acetaminophen 5-325 MG tablet Commonly known as:  ROXICET Take 1 tablet by mouth every 4 (four) hours as needed for severe pain.   polyethylene glycol packet Commonly known as:  MIRALAX Take 17 g by mouth daily.   simvastatin 20 MG tablet Commonly known as:  ZOCOR Take 20 mg by mouth daily.   warfarin 6 MG tablet Commonly known as:  COUMADIN Take 6 mg by mouth daily.        DISCHARGE INSTRUCTIONS:  See AVS.  If you experience worsening of your admission symptoms, develop shortness of breath, life threatening emergency, suicidal or homicidal thoughts you must seek medical attention immediately by calling 911 or calling your MD immediately  if symptoms less severe.  You Must read complete instructions/literature along with all the possible adverse reactions/side effects for all the Medicines you take and that have been prescribed to you. Take any new Medicines after you have completely understood and accpet all the  possible adverse reactions/side effects.   Please note  You were cared for by a hospitalist during your [name] stay. If you have any questions about your discharge medications or the care you received while you were in the [name] after you are discharged, you can call the unit and asked to speak with the hospitalist on call if the hospitalist that took care of you is not available. Once you are discharged, your primary care physician will handle any further medical issues. Please note that NO REFILLS for any discharge medications will be authorized once you are discharged, as it is imperative that you return to your primary care physician (or establish a relationship with a primary care physician if you do not have one) for your aftercare needs so that they can reassess your need for medications and monitor your lab values.    On the day of Discharge:  VITAL SIGNS:  Blood pressure (!) 158/88, pulse 73, temperature 97.8 F (36.6 C), temperature source Oral, resp. rate 16, height '6\' 2"'$  (1.88 m), weight 245 lb 6.4 oz (111.3 kg), SpO2 97 %. PHYSICAL EXAMINATION:  GENERAL:  66 y.o.-year-old patient lying in the bed with no acute distress.  EYES: Pupils equal, round, reactive to light and accommodation. No scleral icterus. Extraocular muscles intact.  HEENT: Head atraumatic, normocephalic. Oropharynx and nasopharynx clear.  NECK:  Supple, no jugular venous distention. No thyroid enlargement, no tenderness.  LUNGS: Normal breath sounds bilaterally, no wheezing, rales,rhonchi or crepitation. No use of accessory muscles of respiration.  CARDIOVASCULAR: S1, S2 normal. No murmurs, rubs, or gallops.  ABDOMEN: Soft, non-tender, non-distended. Bowel sounds present. No organomegaly or mass.  EXTREMITIES: No pedal edema, cyanosis, or clubbing.  NEUROLOGIC: Chronic dysarthria. Muscle strength 5/5 in all extremities. Sensation intact. Gait not checked.  PSYCHIATRIC: The patient is alert and oriented x 3.    SKIN: No obvious rash, lesion, or ulcer.  DATA REVIEW:   CBC  Recent Labs Lab 10/03/16 1229  WBC 4.0  HGB 11.9*  HCT 36.6*  PLT 135*    Chemistries   Recent Labs Lab 10/03/16 1229  10/05/16 0430  NA 136  < > 136  K 3.2*  < > 3.4*  CL 101  < > 104  CO2 28  < > 26  GLUCOSE 85  < > 107*  BUN 13  < > 10  CREATININE 1.11  < > 0.83  CALCIUM 8.5*  < > 8.2*  MG  --   < > 1.6*  AST 32  --   --   ALT 20  --   --   ALKPHOS 72  --   --   BILITOT 0.9  --   --   < > = values in this interval not displayed.   Microbiology Results  Results for orders placed or performed during the [name] encounter of 09/14/16  Aerobic Culture (superficial specimen)     Status: Abnormal   Collection Time: 09/14/16  8:40 AM  Result Value Ref Range Status  Specimen Description LEG  Final   Special Requests NONE  Final   Gram Stain   Final    RARE WBC PRESENT,BOTH PMN AND MONONUCLEAR RARE GRAM POSITIVE COCCI IN PAIRS RARE GRAM NEGATIVE RODS Performed at [name], [address] [place], [place] [address]    Culture MULTIPLE ORGANISMS PRESENT, NONE PREDOMINANT (A)  Final   Report Status 09/16/2016 FINAL  Final    RADIOLOGY:  No results found.   Management plans discussed with the patient, family and they are in agreement.  CODE STATUS: Prior   TOTAL TIME TAKING CARE OF THIS PATIENT: 32 minutes.    Demetrios Loll M.D on 10/05/2016 at 3:43 PM  Between 7am to 6pm - Pager - [phone]  After 6pm go to [email or web address] - Proofreader  [facility]  Office  [phone]  CC: Primary care physician; Theotis Burrow, MD   Note: This dictation was prepared with Dragon dictation along with smaller phrase technology. Any transcriptional errors that result from this process are unintentional.

## 2016-10-05 NOTE — Progress Notes (Signed)
MEDICATION RELATED CONSULT NOTE - INITIAL   Pharmacy Consult for Electrolytes  Indication: electrolyte management   No Known Allergies  Patient Measurements: Height: '6\' 2"'$  (188 cm) Weight: 245 lb 6.4 oz (111.3 kg) IBW/kg (Calculated) : 82.2 Adjusted Body Weight:   Vital Signs: Temp: 97.8 F (36.6 C) (05/22 0908) Temp Source: Oral (05/22 0908) BP: 158/88 (05/22 0908) Pulse Rate: 73 (05/22 0908) Intake/Output from previous day: 05/21 0701 - 05/22 0700 In: 3070 [P.O.:480; I.V.:2590] Out: 1550 [Urine:1550] Intake/Output from this shift: No intake/output data recorded.  Labs:  Recent Labs  10/03/16 1229 10/03/16 1236 10/04/16 0438 10/05/16 0430  WBC 4.0  --   --   --   HGB 11.9*  --   --   --   HCT 36.6*  --   --   --   PLT 135*  --   --   --   APTT 42*  --   --   --   CREATININE 1.11  --  0.75 0.83  MG  --  1.5*  --  1.6*  ALBUMIN 3.5  --   --   --   PROT 7.0  --   --   --   AST 32  --   --   --   ALT 20  --   --   --   ALKPHOS 72  --   --   --   BILITOT 0.9  --   --   --    Estimated Creatinine Clearance: 114.6 mL/min (by C-G formula based on SCr of 0.83 mg/dL).   Microbiology: Recent Results (from the past 720 hour(s))  Aerobic Culture (superficial specimen)     Status: Abnormal   Collection Time: 09/14/16  8:40 AM  Result Value Ref Range Status   Specimen Description LEG  Final   Special Requests NONE  Final   Gram Stain   Final    RARE WBC PRESENT,BOTH PMN AND MONONUCLEAR RARE GRAM POSITIVE COCCI IN PAIRS RARE GRAM NEGATIVE RODS Performed at [facility], [address], [place], [place] [address]    Culture MULTIPLE ORGANISMS PRESENT, NONE PREDOMINANT (A)  Final   Report Status 09/16/2016 FINAL  Final    Medical History: Past Medical History:  Diagnosis Date  . Atrial fibrillation ([place])   . Cocaine abuse    + UDS on admission  . Gout current and history of  . History of deep vein thrombosis 2006  . History of tobacco abuse    has  smoked for 50 years  . Hypertension   . Pulmonary embolism ([place])   . Stroke [facility])     Medications:  Prescriptions Prior to Admission  Medication Sig Dispense Refill Last Dose  . acetaminophen (TYLENOL) 500 MG tablet Take 500 mg by mouth every 6 (six) hours as needed.   prn at prn  . aspirin 81 MG tablet Take 81 mg by mouth daily.   10/03/2016 at am  . colchicine 0.6 MG tablet Take 0.6 mg by mouth daily.    10/03/2016 at am  . furosemide (LASIX) 40 MG tablet Take 40 mg by mouth daily.    10/03/2016 at am  . ibuprofen (ADVIL,MOTRIN) 200 MG tablet Take 200 mg by mouth every 6 (six) hours as needed.   prn at prn  . metoprolol tartrate (LOPRESSOR) 25 MG tablet Take 25 mg by mouth 2 (two) times daily.   10/03/2016 at am  . simvastatin (ZOCOR) 20 MG tablet Take 20  mg by mouth daily.   10/03/2016 at am  . warfarin (COUMADIN) 6 MG tablet Take 6 mg by mouth daily.    10/03/2016 at am  . dicyclomine (BENTYL) 10 MG capsule Take 1 capsule (10 mg total) by mouth 3 (three) times daily before meals. (Patient not taking: Reported on 10/03/2016) 20 capsule 0 Completed Course at Unknown time  . HYDROcodone-acetaminophen (NORCO) 5-325 MG tablet Take 1 tablet by mouth every 4 (four) hours as needed for moderate pain. (Patient not taking: Reported on 10/03/2016) 3 tablet 0 Completed Course at Unknown time  . lactulose (CHRONULAC) 10 GM/15ML solution Take 30 mLs (20 g total) by mouth daily as needed for mild constipation. (Patient not taking: Reported on 10/03/2016) 120 mL 0 Completed Course at Unknown time  . oxyCODONE-acetaminophen (ROXICET) 5-325 MG tablet Take 1 tablet by mouth every 4 (four) hours as needed for severe pain. (Patient not taking: Reported on 09/24/2016) 15 tablet 0 Completed Course at Unknown time  . polyethylene glycol (MIRALAX) packet Take 17 g by mouth daily. (Patient not taking: Reported on 10/03/2016) 14 each 0 Completed Course at Unknown time   Scheduled:  . aspirin  81 mg Oral Daily  . colchicine   0.6 mg Oral Daily  . nicotine  21 mg Transdermal Daily  . polyethylene glycol  17 g Oral Daily  . simvastatin  20 mg Oral Daily  . sodium chloride flush  3 mL Intravenous Q12H  . warfarin  6 mg Oral Daily  . Warfarin - Pharmacist Dosing Inpatient   Does not apply q1800    Assessment: Pharmacy to monitor and replete electrolytes as needed per consult K= 3.4; Mag 1.6 Goal of Therapy:    Plan:   MD ordered 2 g IV of Magnesium and KCl 40 mEq PO x 1. Will recheck electrolytes in 2 days.  Kenden Brandt D 10/05/2016,9:49 AM

## 2016-10-12 ENCOUNTER — Encounter: Payer: Medicare Other | Admitting: Internal Medicine

## 2016-10-12 DIAGNOSIS — F17210 Nicotine dependence, cigarettes, uncomplicated: Secondary | ICD-10-CM | POA: Diagnosis not present

## 2016-10-12 DIAGNOSIS — Z7901 Long term (current) use of anticoagulants: Secondary | ICD-10-CM | POA: Diagnosis not present

## 2016-10-12 DIAGNOSIS — I87331 Chronic venous hypertension (idiopathic) with ulcer and inflammation of right lower extremity: Secondary | ICD-10-CM | POA: Diagnosis not present

## 2016-10-12 DIAGNOSIS — L97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: Secondary | ICD-10-CM | POA: Diagnosis not present

## 2016-10-12 DIAGNOSIS — Z8673 Personal history of transient ischemic attack (TIA), and cerebral infarction without residual deficits: Secondary | ICD-10-CM | POA: Diagnosis not present

## 2016-10-12 DIAGNOSIS — Z86718 Personal history of other venous thrombosis and embolism: Secondary | ICD-10-CM | POA: Diagnosis not present

## 2016-10-12 DIAGNOSIS — I890 Lymphedema, not elsewhere classified: Secondary | ICD-10-CM | POA: Diagnosis not present

## 2016-10-12 DIAGNOSIS — I10 Essential (primary) hypertension: Secondary | ICD-10-CM | POA: Diagnosis not present

## 2016-10-12 DIAGNOSIS — I4891 Unspecified atrial fibrillation: Secondary | ICD-10-CM | POA: Diagnosis not present

## 2016-10-12 DIAGNOSIS — M109 Gout, unspecified: Secondary | ICD-10-CM | POA: Diagnosis not present

## 2016-10-13 NOTE — Progress Notes (Signed)
[name], [name] ([id number]) Visit Report for 10/12/2016 Arrival Information Details [name], Date of Service: 10/12/2016 9:15 AM Patient Name: [name] Patient Account Number: [id number] Medical Record Treating RN: Baruch Gouty, RN, [name], Velva Harman [id number] Number: Other Clinician: Date of Birth/Sex: 08/27/1950 (66 y.o. Male) Treating [name] Primary Care Casimiro Lienhard: Royetta Crochet Nyomie Ehrlich/Extender: G Referring Audianna Landgren: Doristine Locks in Treatment: 37 Visit Information History Since Last Visit All ordered tests and consults were completed: No Patient Arrived: Ambulatory Added or deleted any medications: No Arrival Time: 09:52 Any new allergies or adverse reactions: No Accompanied By: SELF Had a fall or experienced change in No Transfer Assistance: None activities of daily living that may affect Patient Identification Verified: Yes risk of falls: Secondary Verification Process Yes Signs or symptoms of abuse/neglect since last No Completed: visito Patient Requires Transmission- No Hospitalized since last visit: No Based Precautions: Has Dressing in Place as Prescribed: Yes Patient Has Alerts: Yes Has Compression in Place as Prescribed: Yes Patient Alerts: Patient on Blood Pain Present Now: Yes Thinner warfarin Electronic Signature(s) Signed: 10/12/2016 3:29:18 PM By: [name] [name], RN Entered By: [name] on 10/12/2016 09:53:06 [name], [name] ([id number]) -------------------------------------------------------------------------------- Encounter Discharge Information Details [name], Date of Service: 10/12/2016 9:15 AM Patient Name: [name] Patient Account Number: [id number] Medical Record Treating RN: Baruch Gouty, RN, [name], Velva Harman [id number] Number: Other Clinician: Date of Birth/Sex: Feb 08, 1951 (66 y.o. Male) Treating [name] Primary Care Parker Sawatzky: Royetta Crochet Kaedance Magos/Extender: G Referring Hesston Hitchens: Doristine Locks in Treatment: 16 Encounter  Discharge Information Items Discharge Pain Level: 1 Discharge Condition: Stable Ambulatory Status: Ambulatory Discharge Destination: Home Transportation: Private Auto Accompanied By: self Schedule Follow-up Appointment: No Medication Reconciliation completed No and provided to Patient/Care Demetri Goshert: Patient Clinical Summary of Care: Declined Electronic Signature(s) Signed: 10/12/2016 10:36:33 AM By: Ruthine Dose Entered By: Ruthine Dose on 10/12/2016 10:36:32 [name], [name] -------------------------------------------------------------------------------- Lower Extremity Assessment Details [name], Date of Service: 10/12/2016 9:15 AM Patient Name: [name] Patient Account Number: [id number] Medical Record Treating RN: Baruch Gouty, RN, [name], Velva Harman [id number] Number: Other Clinician: Date of Birth/Sex: 06-21-1950 (66 y.o. Male) Treating [name], [place] Primary Care Journey [place]: Royetta Crochet Heike Pounds/Extender: G Referring Keyona Emrich: Royetta Crochet Weeks in Treatment: 16 Edema Assessment Assessed: [Left: No] [Right: No] Edema: [Left: Ye] [Right: s] Calf Left: Right: Point of Measurement: 38 cm From Medial Instep cm 46.2 cm Ankle Left: Right: Point of Measurement: 12 cm From Medial Instep cm 28.7 cm Vascular Assessment Claudication: Claudication Assessment [Right:None] Pulses: Dorsalis Pedis Palpable: [Right:Yes] Posterior Tibial Extremity colors, hair growth, and conditions: Extremity Color: [Right:Hyperpigmented] Hair Growth on Extremity: [Right:Yes] Temperature of Extremity: [Right:Warm] Capillary Refill: [Right:< 3 seconds] Toe Nail Assessment Left: Right: Thick: Yes Discolored: Yes Deformed: Yes Improper Length and Hygiene: Yes Electronic Signature(s) Signed: 10/12/2016 3:29:18 PM By: [name] [name], RN [name], [name] Entered By: [name] on 10/12/2016 10:03:05 [name], [name]  ([id number]) -------------------------------------------------------------------------------- Multi Wound Chart Details [name], Date of Service: 10/12/2016 9:15 AM Patient Name: [name] Patient Account Number: [id number] Medical Record Treating RN: Baruch Gouty RN, [name], Velva Harman [id number] Number: Other Clinician: Date of Birth/Sex: 1950-06-12 (65 y.o. Male) Treating [name] Primary Care Cassadee Vanzandt: Royetta Crochet Estefan Pattison/Extender: G Referring Trejan Buda: Doristine Locks in Treatment: 16 Vital Signs Height(in): 74 Pulse(bpm): 59 Weight(lbs): 252 Blood Pressure 150/89 (mmHg): Body Mass Index(BMI): 32 Temperature(F): 98.1 Respiratory Rate 18 (breaths/min): Photos: [4:No Photos] [N/A:N/A] Wound Location: [4:Right Lower Leg - Medial N/A] Wounding Event: [4:Gradually Appeared] [N/A:N/A] Primary Etiology: [4:Venous Leg Ulcer] [N/A:N/A] Comorbid History: [4:Arrhythmia, Hypertension, N/A Gout, Neuropathy] Date Acquired: [4:06/02/2016] [N/A:N/A]  Weeks of Treatment: [4:16] [N/A:N/A] Wound Status: [4:Open] [N/A:N/A] Measurements L x W x D 2x2.2x0.2 [N/A:N/A] (cm) Area (cm) : [4:3.456] [N/A:N/A] Volume (cm) : [4:0.691] [N/A:N/A] % Reduction in Area: [4:-33.30%] [N/A:N/A] % Reduction in Volume: -166.80% [N/A:N/A] Classification: [4:Partial Thickness] [N/A:N/A] Exudate Amount: [4:Large] [N/A:N/A] Exudate Type: [4:Serosanguineous] [N/A:N/A] Exudate Color: [4:red, brown] [N/A:N/A] Wound Margin: [4:Epibole] [N/A:N/A] Granulation Amount: [4:Medium (34-66%)] [N/A:N/A] Granulation Quality: [4:Pink, Pale] [N/A:N/A] Necrotic Amount: [4:Small (1-33%)] [N/A:N/A] Exposed Structures: [4:Fat Layer (Subcutaneous N/A Tissue) Exposed: Yes Fascia: No Tendon: No Muscle: No] Joint: No Bone: No Epithelialization: Small (1-33%) N/A N/A Debridement: Debridement (67124- N/A N/A 11047) Pre-procedure 10:17 N/A N/A Verification/Time Out Taken: Pain Control: Lidocaine 4% Topical N/A  N/A Solution Tissue Debrided: Fibrin/Slough, Fat, N/A N/A Subcutaneous Level: Skin/Subcutaneous N/A N/A Tissue Debridement Area (sq 4.4 N/A N/A cm): Instrument: Curette N/A N/A Bleeding: Minimum N/A N/A Hemostasis Achieved: Pressure N/A N/A Procedural Pain: 0 N/A N/A Post Procedural Pain: 0 N/A N/A Debridement Treatment Procedure was tolerated N/A N/A Response: well Post Debridement 2x2.2x0.2 N/A N/A Measurements L x W x D (cm) Post Debridement 0.691 N/A N/A Volume: (cm) Periwound Skin Texture: Scarring: Yes N/A N/A Excoriation: No Induration: No Callus: No Crepitus: No Rash: No Periwound Skin Maceration: No N/A N/A Moisture: Dry/Scaly: No Periwound Skin Color: Hemosiderin Staining: Yes N/A N/A Atrophie Blanche: No Cyanosis: No Ecchymosis: No Erythema: No Mottled: No Pallor: No Rubor: No Temperature: No Abnormality N/A N/A Tenderness on Yes N/A N/A Palpation: Wound Preparation: Ulcer Cleansing: N/A N/A Rinsed/Irrigated with Saline, Other: surg scrub Fedie, Tsutomu ([id number]) and water Topical Anesthetic Applied: Other: lidocaine 4% Procedures Performed: Debridement N/A N/A Treatment Notes Wound #4 (Right, Medial Lower Leg) 1. Cleansed with: Cleanse wound with antibacterial soap and water 3. Peri-wound Care: Barrier cream 4. Dressing Applied: Hydrafera Blue 5. Secondary Dressing Applied ABD Pad Dry Gauze 7. Secured with 3 Layer Compression System - Right Lower Extremity Notes unna to anchor Electronic Signature(s) Signed: 10/12/2016 5:12:21 PM By: [name] MD Entered By: [name] on 10/12/2016 12:45:48 [place], [name] ([id number]) -------------------------------------------------------------------------------- Multi-Disciplinary Care Plan Details [place], Date of Service: 10/12/2016 9:15 AM Patient Name: [name] Patient Account Number: [id number] Medical Record Treating RN: Baruch Gouty RN, [name],  Velva Harman [id number] Number: Other Clinician: Date of Birth/Sex: Dec 08, 1950 (66 y.o. Male) Treating [name], [place] Primary Care Julie-Anne Torain: Royetta Crochet Degan Hanser/Extender: G Referring Kiva Norland: Doristine Locks in Treatment: 16 Active Inactive ` Orientation to the Wound Care Program Nursing Diagnoses: Knowledge deficit related to the wound healing center program Goals: Patient/caregiver will verbalize understanding of the [facility] Program Date Initiated: 06/16/2016 Target Resolution Date: 08/15/2016 Goal Status: Active Interventions: Provide education on orientation to the wound center Notes: ` Venous Leg Ulcer Nursing Diagnoses: Potential for venous Insuffiency (use before diagnosis confirmed) Goals: Patient will maintain optimal edema control Date Initiated: 06/16/2016 Target Resolution Date: 08/19/2016 Goal Status: Active Interventions: Compression as ordered Notes: ` Wound/Skin Impairment Nursing Diagnoses: HAJI, DELAINE ([id number]) Impaired tissue integrity Goals: Patient/caregiver will verbalize understanding of skin care regimen Date Initiated: 06/16/2016 Target Resolution Date: 07/15/2016 Goal Status: Active Ulcer/skin breakdown will have a volume reduction of 30% by week 4 Date Initiated: 06/16/2016 Target Resolution Date: 07/15/2016 Goal Status: Active Ulcer/skin breakdown will have a volume reduction of 50% by week 8 Date Initiated: 06/16/2016 Target Resolution Date: 07/15/2016 Goal Status: Active Ulcer/skin breakdown will have a volume reduction of 80% by week 12 Date Initiated: 06/16/2016 Target Resolution Date: 07/15/2016 Goal Status: Active Ulcer/skin breakdown will heal within 14 weeks Date  Initiated: 06/16/2016 Target Resolution Date: 07/15/2016 Goal Status: Active Interventions: Assess patient/caregiver ability to obtain necessary supplies Assess patient/caregiver ability to perform ulcer/skin care regimen upon admission and as needed Assess  ulceration(s) every visit Notes: Electronic Signature(s) Signed: 10/12/2016 3:29:18 PM By: [name] [name], RN Entered By: [name] on 10/12/2016 10:18:09 [place], [name] ([id number]) -------------------------------------------------------------------------------- Pain Assessment Details [place], Date of Service: 10/12/2016 9:15 AM Patient Name: [name] Patient Account Number: [id number] Medical Record Treating RN: Baruch Gouty RN, [name], Velva Harman [id number] Number: Other Clinician: Date of Birth/Sex: Oct 08, 1950 (66 y.o. Male) Treating [name] Primary Care Kamaljit Hizer: Royetta Crochet Caine Barfield/Extender: G Referring Lorraine Cimmino: Doristine Locks in Treatment: 16 Active Problems Location of Pain Severity and Description of Pain Patient Has Paino No Site Locations With Dressing Change: No Pain Management and Medication Current Pain Management: Electronic Signature(s) Signed: 10/12/2016 3:29:18 PM By: [name] [name], RN Entered By: [name] on 10/12/2016 09:54:04 Cauthon, [name] ([id number]) -------------------------------------------------------------------------------- Patient/Caregiver Education Details Cabri[place], Date of Service: 10/12/2016 9:15 AM Patient Name: [facility] Patient Account Number: [id number] Medical Record Treating RN: Baruch Gouty, RN, [name], Velva Harman [id number] Number: Other Clinician: Date of Birth/Gender: May 26, 1950 (66 y.o. Male) Treating [name], MICHAEL Primary Care Physician/Extender: Tonye Pearson Physician: Suella Grove in Treatment: 16 Referring Physician: Royetta Crochet Education Assessment Education Provided To: Patient Education Topics Provided Welcome To The [facility]: Methods: Explain/Verbal Responses: State content correctly Wound Debridement: Methods: Explain/Verbal Responses: State content correctly Wound/Skin Impairment: Methods: Explain/Verbal Responses: State content correctly Electronic Signature(s) Signed: 10/12/2016 3:29:18 PM By:  [name] [name], RN Entered By: [name] on 10/12/2016 10:33:31 [place], [name] ([id number]) -------------------------------------------------------------------------------- Wound Assessment Details Kellett, Date of Service: 10/12/2016 9:15 AM Patient Name: [name] Patient Account Number: [id number] Medical Record Treating RN: Baruch Gouty RN, [name], Velva Harman [id number] Number: Other Clinician: Date of Birth/Sex: [birth year] (66 y.o. Male) Treating [name] Primary Care Micaila Ziemba: Royetta Crochet Hanh Kertesz/Extender: G Referring Deztinee Lohmeyer: Doristine Locks in Treatment: 16 Wound Status Wound Number: 4 Primary Venous Leg Ulcer Etiology: Wound Location: Right Lower Leg - Medial Wound Status: Open Wounding Event: Gradually Appeared Comorbid Arrhythmia, Hypertension, Gout, Date Acquired: 06/02/2016 History: Neuropathy Weeks Of Treatment: 16 Clustered Wound: No Photos Photo Uploaded By: [name] on 10/12/2016 16:05:37 Wound Measurements Length: (cm) 2 Width: (cm) 2.2 Depth: (cm) 0.2 Area: (cm) 3.456 Volume: (cm) 0.691 % Reduction in Area: -33.3% % Reduction in Volume: -166.8% Epithelialization: Small (1-33%) Tunneling: No Undermining: No Wound Description Classification: Partial Thickness Wound Margin: Epibole Exudate Amount: Large Beaumier, Shemar ([id number]) Foul Odor After Cleansing: No Slough/Fibrino Yes Exudate Type: Serosanguineous Exudate Color: red, brown Wound Bed Granulation Amount: Medium (34-66%) Exposed Structure Granulation Quality: Pink, Pale Fascia Exposed: No Necrotic Amount: Small (1-33%) Fat Layer (Subcutaneous Tissue) Exposed: Yes Necrotic Quality: Adherent Slough Tendon Exposed: No Muscle Exposed: No Joint Exposed: No Bone Exposed: No Periwound Skin Texture Texture Color No Abnormalities Noted: No No Abnormalities Noted: No Callus: No Atrophie Blanche: No Crepitus: No Cyanosis: No Excoriation: No Ecchymosis: No Induration:  No Erythema: No Rash: No Hemosiderin Staining: Yes Scarring: Yes Mottled: No Pallor: No Moisture Rubor: No No Abnormalities Noted: No Dry / Scaly: No Temperature / Pain Maceration: No Temperature: No Abnormality Tenderness on Palpation: Yes Wound Preparation Ulcer Cleansing: Rinsed/Irrigated with Saline, Other: surg scrub and water, Topical Anesthetic Applied: Other: lidocaine 4%, Treatment Notes Wound #4 (Right, Medial Lower Leg) 1. Cleansed with: Cleanse wound with antibacterial soap and water 3. Peri-wound Care: Barrier cream 4. Dressing Applied: Hydrafera Blue 5. Secondary Dressing Applied ABD Pad Dry Gauze  7. Secured with 3 Layer Compression System - Right Lower Extremity Notes unna to anchor DIYAN, DAVE ([id number]) Electronic Signature(s) Signed: 10/12/2016 3:29:18 PM By: [name] [name], RN Entered By: [name] on 10/12/2016 10:00:22 Triggs, [name] ([id number]) -------------------------------------------------------------------------------- Vitals Details [place], Date of Service: 10/12/2016 9:15 AM Patient Name: [name] Patient Account Number: [id number] Medical Record Treating RN: Baruch Gouty, RN, [name], Velva Harman [id number] Number: Other Clinician: Date of Birth/Sex: [birth year] (65 y.o. Male) Treating [name], [place] Primary Care Bucky Grigg: Royetta Crochet Jasilyn Holderman/Extender: G Referring Soham Hollett: Doristine Locks in Treatment: 16 Vital Signs Time Taken: 09:54 Temperature (F): 98.1 Height (in): 74 Pulse (bpm): 59 Weight (lbs): 252 Respiratory Rate (breaths/min): 18 Body Mass Index (BMI): 32.4 Blood Pressure (mmHg): 150/89 Reference Range: 80 - 120 mg / dl Electronic Signature(s) Signed: 10/12/2016 3:29:18 PM By: [name] [name], RN Entered By: [name] on 10/12/2016 09:54:33
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
## 2016-10-19 ENCOUNTER — Encounter: Payer: Medicare Other | Attending: Internal Medicine | Admitting: Internal Medicine

## 2016-10-19 DIAGNOSIS — L97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: Secondary | ICD-10-CM | POA: Diagnosis not present

## 2016-10-19 DIAGNOSIS — I4891 Unspecified atrial fibrillation: Secondary | ICD-10-CM | POA: Diagnosis not present

## 2016-10-19 DIAGNOSIS — Z86718 Personal history of other venous thrombosis and embolism: Secondary | ICD-10-CM | POA: Diagnosis not present

## 2016-10-19 DIAGNOSIS — Z8673 Personal history of transient ischemic attack (TIA), and cerebral infarction without residual deficits: Secondary | ICD-10-CM | POA: Insufficient documentation

## 2016-10-19 DIAGNOSIS — Z7901 Long term (current) use of anticoagulants: Secondary | ICD-10-CM | POA: Insufficient documentation

## 2016-10-19 DIAGNOSIS — I87331 Chronic venous hypertension (idiopathic) with ulcer and inflammation of right lower extremity: Secondary | ICD-10-CM | POA: Diagnosis present

## 2016-10-19 DIAGNOSIS — M109 Gout, unspecified: Secondary | ICD-10-CM | POA: Insufficient documentation

## 2016-10-19 DIAGNOSIS — F17210 Nicotine dependence, cigarettes, uncomplicated: Secondary | ICD-10-CM | POA: Diagnosis not present

## 2016-10-19 DIAGNOSIS — I890 Lymphedema, not elsewhere classified: Secondary | ICD-10-CM | POA: Diagnosis not present

## 2016-10-19 DIAGNOSIS — I10 Essential (primary) hypertension: Secondary | ICD-10-CM | POA: Insufficient documentation

## 2016-10-19 NOTE — Progress Notes (Signed)
Late entry for missed G-code. Based on review of the evaluation and goals by Orinda Kenner, [place], [place], [place], [place]

## 2016-10-19 NOTE — Progress Notes (Signed)
Late entry for missed G-code. Based on review of the evaluation and goals by Orinda Kenner, [place], [place], [place], [place]

## 2016-10-20 ENCOUNTER — Emergency Department
Admission: EM | Admit: 2016-10-20 | Discharge: 2016-10-20 | Disposition: A | Discharge status: Home / Self Care | Payer: Medicare Other | Attending: Emergency Medicine | Admitting: Emergency Medicine

## 2016-10-20 DIAGNOSIS — E119 Type 2 diabetes mellitus without complications: Secondary | ICD-10-CM | POA: Diagnosis not present

## 2016-10-20 DIAGNOSIS — M1A0410 Idiopathic chronic gout, right hand, without tophus (tophi): Secondary | ICD-10-CM | POA: Diagnosis not present

## 2016-10-20 DIAGNOSIS — Z79899 Other long term (current) drug therapy: Secondary | ICD-10-CM | POA: Diagnosis not present

## 2016-10-20 DIAGNOSIS — I10 Essential (primary) hypertension: Secondary | ICD-10-CM | POA: Insufficient documentation

## 2016-10-20 DIAGNOSIS — F17210 Nicotine dependence, cigarettes, uncomplicated: Secondary | ICD-10-CM | POA: Diagnosis not present

## 2016-10-20 DIAGNOSIS — R2231 Localized swelling, mass and lump, right upper limb: Secondary | ICD-10-CM | POA: Diagnosis present

## 2016-10-20 DIAGNOSIS — Z7982 Long term (current) use of aspirin: Secondary | ICD-10-CM | POA: Insufficient documentation

## 2016-10-20 NOTE — ED Triage Notes (Signed)
Pt stated that he was here for finger pain/swelling in his pinky finger on right hand. He also had a ring on the finger that had to be cut off per ED. He stated that is started yesterday. Hx of Stroke 4 years ago. PA at bedside.

## 2016-10-20 NOTE — ED Notes (Signed)
Finger on right pinky swollen behind ring since yesterday.
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
## 2016-10-20 NOTE — Progress Notes (Signed)
   10/04/16 1332  Acute Rehab OT Goals  Patient Stated Goal go home  OT Goal Formulation With patient  Time For Goal Achievement 10/18/16  Potential to Achieve Goals Good  OT Time Calculation  OT Start Time (ACUTE ONLY) 0944  OT Stop Time (ACUTE ONLY) 1017  OT Time Calculation (min) 33 min  OT G-codes **NOT FOR INPATIENT CLASS**  Functional Assessment Tool Used AM-PAC 6 Clicks Daily Activity  Functional Limitation Self care  Self Care Current Status (U0156) CJ  Self Care Goal Status (F5379) CJ  OT General Charges  $OT Visit 1 Procedure  OT Evaluation  $OT Eval Low Complexity 1 Procedure  OT Treatments  $Self Care/Home Management  8-22 mins   Late entry G codes added after review of initial documentation.  Jeni Salles, MPH, MS, OTR/L ascom [phone] 10/20/16, 4:47 PM

## 2016-10-20 NOTE — ED Notes (Signed)
Ring cut off in 1 spot without difficulty

## 2016-10-20 NOTE — Progress Notes (Signed)
THUAN, TIPPETT ([id number]) Visit Report for 10/19/2016 Arrival Information Details [place], Date of Service: 10/19/2016 8:15 AM Patient Name: [facility] Patient Account Number: [id number] Medical Record Treating RN: Baruch Gouty, RN, [name], Velva Harman [id number] Number: Other Clinician: Date of Birth/Sex: 07/27/1950 (66 y.o. Male) Treating [name] Primary Care Taylen Wendland: Royetta Crochet Ellorie Kindall/Extender: G Referring Tiffiney Sparrow: Doristine Locks in Treatment: 20 Visit Information History Since Last Visit All ordered tests and consults were completed: No Patient Arrived: Ambulatory Added or deleted any medications: No Arrival Time: 08:17 Any new allergies or adverse reactions: No Accompanied By: self Had a fall or experienced change in No Transfer Assistance: None activities of daily living that may affect Patient Identification Verified: Yes risk of falls: Secondary Verification Process Yes Signs or symptoms of abuse/neglect since last No Completed: visito Patient Requires Transmission- No Hospitalized since last visit: No Based Precautions: Has Dressing in Place as Prescribed: Yes Patient Has Alerts: Yes Has Compression in Place as Prescribed: Yes Patient Alerts: Patient on Blood Pain Present Now: No Thinner warfarin Electronic Signature(s) Signed: 10/19/2016 4:40:28 PM By: [name] [name], RN Entered By: [name] on 10/19/2016 08:17:42 Mol, [name] ([id number]) -------------------------------------------------------------------------------- Encounter Discharge Information Details Menken, Date of Service: 10/19/2016 8:15 AM Patient Name: [name] Patient Account Number: [id number] Medical Record Treating RN: Baruch Gouty, RN, [name], Velva Harman [id number] Number: Other Clinician: Date of Birth/Sex: 05-07-1951 (66 y.o. Male) Treating [name] Primary Care Elzie Knisley: Royetta Crochet Dannelle Rhymes/Extender: G Referring Lewanna Petrak: Doristine Locks in Treatment: 17 Encounter  Discharge Information Items Schedule Follow-up Appointment: No Medication Reconciliation completed and No provided to Patient/Care Cheston Coury: Patient Clinical Summary of Care: Declined Electronic Signature(s) Signed: 10/19/2016 8:54:26 AM By: Sharon Mt Entered By: Sharon Mt on 10/19/2016 08:54:26 Kettles, [name] ([id number]) -------------------------------------------------------------------------------- Lower Extremity Assessment Details Rendall, Date of Service: 10/19/2016 8:15 AM Patient Name: [name] Patient Account Number: [id number] Medical Record Treating RN: Baruch Gouty, RN, [name], Velva Harman [id number] Number: Other Clinician: Date of Birth/Sex: 03-03-1951 (65 y.o. Male) Treating [name], [place] Primary Care Najah Liverman: Royetta Crochet Danil Wedge/Extender: G Referring Cassadee Vanzandt: Doristine Locks in Treatment: 17 Edema Assessment Assessed: [Left: No] [Right: No] E[Left: dema] [Right: :] Calf Left: Right: Point of Measurement: 38 cm From Medial Instep cm 48.5 cm Ankle Left: Right: Point of Measurement: 12 cm From Medial Instep cm 28.5 cm Vascular Assessment Claudication: Claudication Assessment [Right:None] Pulses: Dorsalis Pedis Palpable: [Right:Yes] Posterior Tibial Extremity colors, hair growth, and conditions: Extremity Color: [Right:Hyperpigmented] Hair Growth on Extremity: [Right:Yes] Temperature of Extremity: [Right:Warm] Capillary Refill: [Right:< 3 seconds] Toe Nail Assessment Left: Right: Thick: Yes Discolored: Yes Deformed: Yes Improper Length and Hygiene: Yes Electronic Signature(s) Signed: 10/19/2016 4:40:28 PM By: [name] [name], RN Fike, [place] ([id number]) Entered By: [name] on 10/19/2016 08:24:21 Bring, [name] ([id number]) -------------------------------------------------------------------------------- Multi Wound Chart Details Hubbert, Date of Service: 10/19/2016 8:15 AM Patient Name: [name] Patient Account Number:  [id number] Medical Record Treating RN: Baruch Gouty RN, [name], Velva Harman [id number] Number: Other Clinician: Date of Birth/Sex: [birth year] (65 y.o. 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] [N/A:N/A] Comorbid History: [4:Arrhythmia, Hypertension, N/A Gout, Neuropathy] Date Acquired: [4:06/02/2016] [N/A:N/A] Weeks of Treatment: [4:17] [N/A:N/A] Wound Status: [4:Open] [N/A:N/A] Measurements L x W x D 1.8x2.5x0.2 [N/A:N/A] (cm) Area (cm) : [  4:3.534] [N/A:N/A] Volume (cm) : [4:0.707] [N/A:N/A] % Reduction in Area: [4:-36.30%] [N/A:N/A] % Reduction in Volume: -173.00% [N/A:N/A] Classification: [4:Partial Thickness] [N/A:N/A] Exudate Amount: [4:Large] [N/A:N/A] Exudate Type: [4:Serosanguineous] [N/A:N/A] Exudate Color: [4:red, brown] [N/A:N/A] Wound Margin: [4:Epibole] [N/A:N/A] Granulation Amount: [4:Medium (34-66%)] [N/A:N/A] Granulation Quality: [4:Pink, Pale] [N/A:N/A] Necrotic Amount: [4:Small (1-33%)] [N/A:N/A] Exposed Structures: [4:Fat Layer (Subcutaneous N/A Tissue) Exposed: Yes Fascia: No Tendon: No Muscle: No] Joint: No Bone: No Epithelialization: Small (1-33%) N/A N/A Periwound Skin Texture: Scarring: Yes N/A N/A Excoriation: No Induration: No Callus: No Crepitus: No Rash: No Periwound Skin Maceration: No N/A N/A Moisture: Dry/Scaly: No Periwound Skin Color: Hemosiderin Staining: Yes N/A N/A Atrophie Blanche: No Cyanosis: No Ecchymosis: No Erythema: No Mottled: No Pallor: No Rubor: No Temperature: No Abnormality N/A  N/A Tenderness on Yes N/A N/A Palpation: Wound Preparation: Ulcer Cleansing: N/A N/A Rinsed/Irrigated with Saline, Other: surg scrub and water Topical Anesthetic Applied: Other: lidocaine 4% Treatment Notes Electronic Signature(s) Signed: 10/19/2016 5:27:39 PM By: [name] MD Entered By: [name] on 10/19/2016 08:43:22 Clio, [name] ([id number]) -------------------------------------------------------------------------------- Multi-Disciplinary Care Plan Details [place], Date of Service: 10/19/2016 8:15 AM Patient Name: [name] Patient Account Number: [id number] Medical Record Treating RN: Baruch Gouty RN, [name], Velva Harman [id number] Number: Other Clinician: Date of Birth/Sex: January 18, 1951 (65 y.o. Male) Treating [name], MICHAEL Primary Care Ivette Castronova: Royetta Crochet Evalette Montrose/Extender: G Referring Disha Cottam: Doristine Locks in Treatment: 17 Active Inactive ` Orientation to the Wound Care Program Nursing Diagnoses: Knowledge deficit related to the wound healing center program Goals: Patient/caregiver will verbalize understanding of the [place] Program Date Initiated: 06/16/2016 Target Resolution Date: 08/15/2016 Goal Status: Active Interventions: Provide education on orientation to the wound center Notes: ` Venous Leg Ulcer Nursing Diagnoses: Potential for venous Insuffiency (use before diagnosis confirmed) Goals: Patient will maintain optimal edema control Date Initiated: 06/16/2016 Target Resolution Date: 08/19/2016 Goal Status: Active Interventions: Compression as ordered Notes: ` Wound/Skin Impairment Nursing Diagnoses: ZACARIAS, KRAUTER ([id number]) Impaired tissue integrity Goals: Patient/caregiver will verbalize understanding of skin care regimen Date Initiated: 06/16/2016 Target Resolution Date: 07/15/2016 Goal Status: Active Ulcer/skin breakdown will have a volume reduction of 30% by week 4 Date Initiated: 06/16/2016 Target Resolution  Date: 07/15/2016 Goal Status: Active Ulcer/skin breakdown will have a volume reduction of 50% by week 8 Date Initiated: 06/16/2016 Target Resolution Date: 07/15/2016 Goal Status: Active Ulcer/skin breakdown will have a volume reduction of 80% by week 12 Date Initiated: 06/16/2016 Target Resolution Date: 07/15/2016 Goal Status: Active Ulcer/skin breakdown will heal within 14 weeks Date Initiated: 06/16/2016 Target Resolution Date: 07/15/2016 Goal Status: Active Interventions: Assess patient/caregiver ability to obtain necessary supplies Assess patient/caregiver ability to perform ulcer/skin care regimen upon admission and as needed Assess ulceration(s) every visit Notes: Electronic Signature(s) Signed: 10/19/2016 4:40:28 PM By: [name] [name], RN Entered By: [name] on 10/19/2016 08:31:43 Leitzke, [name] ([id number]) -------------------------------------------------------------------------------- Pain Assessment Details Keay, Date of Service: 10/19/2016 8:15 AM Patient Name: [name] Patient Account Number: [id number] Medical Record Treating RN: Baruch Gouty RN, [name], Velva Harman [id number] Number: Other Clinician: Date of Birth/Sex: 04/11/1951 (66 y.o. Male) Treating [name] Primary Care Tadeo Besecker: Royetta Crochet Srinidhi Landers/Extender: G Referring Lorn Butcher: Doristine Locks in Treatment: 17 Active Problems Location of Pain Severity and Description of Pain Patient Has Paino No Site Locations With Dressing Change: No Pain Management and Medication Current Pain Management: Electronic Signature(s) Signed: 10/19/2016 4:40:28 PM By: [name] [name], RN Entered By: [name] on 10/19/2016 08:17:50 Clairmont, [name] ([id number]) -------------------------------------------------------------------------------- Wound Assessment Details Raineri, Date of Service: 10/19/2016 8:15 AM  Patient Name: ZAYQUAN Patient Account Number: [id number] Medical Record Treating RN: Baruch Gouty, RN, [name],  Velva Harman [id number] Number: Other Clinician: Date of Birth/Sex: Apr 15, 1951 (66 y.o. Male) Treating [name], MICHAEL Primary Care Maeley Matton: Royetta Crochet Amirr Achord/Extender: G Referring Louie Meaders: Doristine Locks in Treatment: 17 Wound Status Wound Number: 4 Primary Venous Leg Ulcer Etiology: Wound Location: Right Lower Leg - Medial Wound Status: Open Wounding Event: Gradually Appeared Comorbid Arrhythmia, Hypertension, Gout, Date Acquired: 06/02/2016 History: Neuropathy Weeks Of Treatment: 17 Clustered Wound: No Photos Photo Uploaded By: [name] on 10/19/2016 12:01:42 Wound Measurements Length: (cm) 1.8 Width: (cm) 2.5 Depth: (cm) 0.2 Area: (cm) 3.534 Volume: (cm) 0.707 % Reduction in Area: -36.3% % Reduction in Volume: -173% Epithelialization: Small (1-33%) Tunneling: No Undermining: No Wound Description Classification: Partial Thickness Wound Margin: Epibole Exudate Amount: Large Plain, [place] ([id number]) Foul Odor After Cleansing: No Slough/Fibrino Yes Exudate Type: Serosanguineous Exudate Color: red, brown Wound Bed Granulation Amount: Medium (34-66%) Exposed Structure Granulation Quality: Pink, Pale Fascia Exposed: No Necrotic Amount: Small (1-33%) Fat Layer (Subcutaneous Tissue) Exposed: Yes Necrotic Quality: Adherent Slough Tendon Exposed: No Muscle Exposed: No Joint Exposed: No Bone Exposed: No Periwound Skin Texture Texture Color No Abnormalities Noted: No No Abnormalities Noted: No Callus: No Atrophie Blanche: No Crepitus: No Cyanosis: No Excoriation: No Ecchymosis: No Induration: No Erythema: No Rash: No Hemosiderin Staining: Yes Scarring: Yes Mottled: No Pallor: No Moisture Rubor: No No Abnormalities Noted: No Dry / Scaly: No Temperature / Pain Maceration: No Temperature: No Abnormality Tenderness on Palpation: Yes Wound Preparation Ulcer Cleansing: Rinsed/Irrigated with Saline, Other: surg scrub and water, Topical  Anesthetic Applied: Other: lidocaine 4%, Electronic Signature(s) Signed: 10/19/2016 4:40:28 PM By: [name] [name], RN Entered By: [name] on 10/19/2016 08:31:18 Stallone, [name] ([id number]) -------------------------------------------------------------------------------- Vitals Details Tillett, Date of Service: 10/19/2016 8:15 AM Patient Name: [name] Patient Account Number: [id number] Medical Record Treating RN: Baruch Gouty, RN, [name], Velva Harman [id number] Number: Other Clinician: Date of Birth/Sex: Jul 07, 1950 (65 y.o. Male) Treating [name], [place] Primary Care Wynonia Medero: Royetta Crochet Maryjean Corpening/Extender: G Referring Philisha Weinel: Doristine Locks in Treatment: 17 Vital Signs Time Taken: 08:19 Temperature (F): 97.9 Height (in): 74 Pulse (bpm): 54 Weight (lbs): 252 Respiratory Rate (breaths/min): 18 Body Mass Index (BMI): 32.4 Blood Pressure (mmHg): 134/84 Reference Range: 80 - 120 mg / dl Electronic Signature(s) Signed: 10/19/2016 4:40:28 PM By: [name] [name], RN Entered By: [name] on 10/19/2016 08:19:39

## 2016-10-20 NOTE — ED Provider Notes (Signed)
[facility] Emergency Department Provider Note  ____________________________________________  Time seen: Approximately 9:03 PM  I have reviewed the triage vital signs and the nursing notes.   HISTORY  Chief Complaint No chief complaint on file.    HPI [name] is a 66 y.o. male who presents emergency department complaining of swelling to his right hand. Patient reports that he has a history of gout and takes allopurinol on a daily basis. He reports that he has been instructed to take 2 any has a gout flare but is concerned as he had a ring stuck on his fifth digit. This is successfully removed in triage by ED tech. Patient states that this time he has no other concerns. He declined any medications in the emergency department and states that he'll follow-up with primary care for any concerns of the scalp.   Past Medical History:  Diagnosis Date  . Atrial fibrillation ([place])   . Cocaine abuse    + UDS on admission  . Gout current and history of  . History of deep vein thrombosis 2006  . History of tobacco abuse    has smoked for 50 years  . Hypertension   . Pulmonary embolism ([place])   . Stroke [facility])     Patient Active Problem List   Diagnosis Date Noted  . Facial twitching   . CVA (cerebral vascular accident) ([place]) 10/03/2016  . 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 Pneumonia 10/12/2014  . CVA (cerebral infarction) 04/26/2011  . Diabetes mellitus ([place]) 04/26/2011  . 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 Anemia 04/26/2011  . DVT (deep venous thrombosis) ([place]) 04/26/2011    Past Surgical History:  Procedure Laterality Date  . VASCULAR SURGERY     Stent in leg    Prior to Admission medications   Medication Sig Start Date End Date Taking? Authorizing Provider  acetaminophen (TYLENOL) 500 MG tablet Take 500 mg by mouth every 6 (six) hours as needed.    [provider]  aspirin 81 MG  tablet Take 81 mg by mouth daily.    [provider]  colchicine 0.6 MG tablet Take 0.6 mg by mouth daily.     [provider]  furosemide (LASIX) 40 MG tablet Take 40 mg by mouth daily.  08/05/15   [provider]  HYDROcodone-acetaminophen (NORCO) 5-325 MG tablet Take 1 tablet by mouth every 4 (four) hours as needed for moderate pain. Patient not taking: Reported on 10/03/2016 07/18/16   Merlyn Lot, MD  ibuprofen (ADVIL,MOTRIN) 200 MG tablet Take 200 mg by mouth every 6 (six) hours as needed.    [provider]  lactulose (CHRONULAC) 10 GM/15ML solution Take 30 mLs (20 g total) by mouth daily as needed for mild constipation. Patient not taking: Reported on 10/03/2016 05/11/15   Paulette Blanch, MD  metoprolol tartrate (LOPRESSOR) 25 MG tablet Take 25 mg by mouth 2 (two) times daily.    [provider]  nutrition supplement, JUVEN, (JUVEN) PACK Take 1 packet by mouth 2 (two) times daily between meals. 10/05/16   Demetrios Loll, MD  oxyCODONE-acetaminophen (ROXICET) 5-325 MG tablet Take 1 tablet by mouth every 4 (four) hours as needed for severe pain. Patient not taking: Reported on 09/24/2016 09/14/15   Paulette Blanch, MD  polyethylene glycol [facility]) packet Take 17 g by mouth daily. Patient not taking: Reported on 10/03/2016 05/14/15   Loney Hering, MD  simvastatin (ZOCOR) 20 MG  tablet Take 20 mg by mouth daily.    [provider]  warfarin (COUMADIN) 6 MG tablet Take 6 mg by mouth daily.     [provider]    Allergies Patient has no known allergies.  Family History  Problem Relation Age of Onset  . Aneurysm Mother   . Diabetes Father     Social History Social History  Substance Use Topics  . Smoking status: Current Every Day Smoker    Packs/day: 0.50    Years: 50.00    Types: Cigarettes  . Smokeless tobacco: Never Used  . Alcohol use No     Review of Systems  Constitutional: No fever/chills Eyes: No visual  changes. No discharge ENT: No upper respiratory complaints. Cardiovascular: no chest pain. Respiratory: no cough. No SOB. Musculoskeletal: Positive for swelling to his right hand. Positive for in place ring fifth digit Skin: Negative for rash, abrasions, lacerations, ecchymosis. Neurological: Negative for headaches, focal weakness or numbness. 10-point ROS otherwise negative.  ____________________________________________   PHYSICAL EXAM:  VITAL SIGNS: ED Triage Vitals [10/20/16 2059]  Enc Vitals Group     BP      Pulse      Resp      Temp      Temp src      SpO2      Weight      Height      Head Circumference      Peak Flow      Pain Score 5     Pain Loc      Pain Edu?      Excl. in [place]?      Constitutional: Alert and oriented. Well appearing and in no acute distress. Eyes: Conjunctivae are normal. PERRL. EOMI. Head: Atraumatic. Neck: No stridor.    Cardiovascular: Normal rate, regular rhythm. Normal S1 and S2.  Good peripheral circulation. Respiratory: Normal respiratory effort without tachypnea or retractions. Lungs CTAB. Good air entry to the bases with no decreased or absent breath sounds. Musculoskeletal: Full range of motion to all extremities. No gross deformities appreciated.Edema of the PIP joint fifth digit right hand is appreciated. Ring is no longer in place. Full range of motion all digits right hand. Sensation intact all digits right hand. Cap refill intact all digits right hand. Neurologic:  Normal speech and language. No gross focal neurologic deficits are appreciated.  Skin:  Skin is warm, dry and intact. No rash noted. Psychiatric: Mood and affect are normal. Speech and behavior are normal. Patient exhibits appropriate insight and judgement.   ____________________________________________   LABS (all labs ordered are listed, but only abnormal results are displayed)  Labs Reviewed - No data to  display ____________________________________________  EKG   ____________________________________________  RADIOLOGY   No results found.  ____________________________________________    PROCEDURES  Procedure(s) performed:    Procedures    Medications - No data to display   ____________________________________________   INITIAL IMPRESSION / ASSESSMENT AND PLAN / ED COURSE  Pertinent labs & imaging results that were available during my care of the patient were reviewed by me and considered in my medical decision making (see chart for details).  Review of the [place] CSRS was performed in accordance of the [place] prior to dispensing any controlled drugs.     Patient's diagnosis is consistent with gout flare. Patient reports that he has increased gout symptoms to the right hand. He had a ring on the fifth digit of the right hand. He presents emergency department  for removal. This is removed by ED tech in triage. At this time, patient states that he has medication for his gout and has no concerns. Patient declines any workup at this time. No medications prescribed at this time. He'll follow up primary care as needed.. Patient is given ED precautions to return to the ED for any worsening or new symptoms.     ____________________________________________  FINAL CLINICAL IMPRESSION(S) / ED DIAGNOSES  Final diagnoses:  Chronic gout of right hand, unspecified cause      NEW MEDICATIONS STARTED DURING THIS VISIT:  New Prescriptions   No medications on file        This chart was dictated using voice recognition software/Dragon. Despite best efforts to proofread, errors can occur which can change the meaning. Any change was purely unintentional.    Darletta Moll, PA-C 10/20/16 2121    Earleen Newport, MD 10/20/16 2245

## 2016-10-21 NOTE — Progress Notes (Signed)
   10/04/16 1643  PT Time Calculation  PT Start Time (ACUTE ONLY) 1550  PT Stop Time (ACUTE ONLY) 1621  PT Time Calculation (min) (ACUTE ONLY) 31 min  PT G-Codes **NOT FOR INPATIENT CLASS**  Functional Assessment Tool Used AM-PAC 6 Clicks Basic Mobility;Clinical judgement  Functional Limitation Mobility: Walking and moving around  Mobility: Walking and Moving Around Current Status (Z6109) CI  Mobility: Walking and Moving Around Goal Status (U0454) CI  Mobility: Walking and Moving Around Discharge Status (U9811) CI  PT General Charges  $$ ACUTE PT VISIT 1 Procedure  PT Evaluation  $PT Eval Low Complexity 1 Procedure    Late-entry g-codes added after review of initial evaluation.  Leira Regino H. Owens Shark, PT, DPT, NCS 10/21/16, 12:15 PM [phone]

## 2016-10-26 ENCOUNTER — Encounter: Payer: Medicare Other | Admitting: Internal Medicine

## 2016-10-26 DIAGNOSIS — I87331 Chronic venous hypertension (idiopathic) with ulcer and inflammation of right lower extremity: Secondary | ICD-10-CM | POA: Diagnosis not present

## 2016-10-26 NOTE — Progress Notes (Addendum)
[name], [name] ([id number]) Visit Report for 10/26/2016 Arrival Information Details [place], Date of Service: 10/26/2016 8:15 AM Patient Name: [name] Patient Account Number: [id number] Medical Record Treating RN: Baruch Gouty, RN, [name], Velva Harman [id number] Number: Other Clinician: Date of Birth/Sex: [birth year] (66 y.o. Male) Treating [name] Primary Care Zendaya Groseclose: Royetta Crochet Marek Nghiem/Extender: G Referring Semiyah Newgent: Doristine Locks in Treatment: 18 Visit Information History Since Last Visit All ordered tests and consults were completed: No Patient Arrived: Ambulatory Added or deleted any medications: No Arrival Time: 08:03 Any Mark allergies or adverse reactions: No Accompanied By: self Had a fall or experienced change in No Transfer Assistance: None activities of daily living that may affect Patient Identification Verified: Yes risk of falls: Secondary Verification Process Yes Signs or symptoms of abuse/neglect since last No Completed: visito Patient Requires Transmission- No Hospitalized since last visit: No Based Precautions: Has Dressing in Place as Prescribed: Yes Patient Has Alerts: Yes Has Compression in Place as Prescribed: Yes Patient Alerts: Patient on Blood Pain Present Now: No Thinner warfarin Electronic Signature(s) Signed: 10/26/2016 8:03:44 AM By: [name] [name], RN Entered By: [name] on 10/26/2016 08:03:44 [name], [name] ([id number]) -------------------------------------------------------------------------------- Encounter Discharge Information Details [name], Date of Service: 10/26/2016 8:15 AM Patient Name: [name] Patient Account Number: [id number] Medical Record Treating RN: Baruch Gouty, RN, [name], Velva Harman [id number] Number: Other Clinician: Date of Birth/Sex: Jan 03, 1951 (66 y.o. Male) Treating [name] Primary Care Anayely Constantine: Royetta Crochet Roland Prine/Extender: G Referring Estuardo Frisbee: Doristine Locks in Treatment: 18 Encounter  Discharge Information Items Discharge Pain Level: 0 Discharge Condition: Stable Ambulatory Status: Ambulatory Discharge Destination: Home Transportation: Private Auto Accompanied By: self Schedule Follow-up Appointment: No Medication Reconciliation completed No and provided to Patient/Care Barth Trella: Patient Clinical Summary of Care: Declined Electronic Signature(s) Signed: 10/26/2016 8:56:41 AM By: Ruthine Dose Entered By: Ruthine Dose on 10/26/2016 08:56:40 [name], [name] ([id number]) -------------------------------------------------------------------------------- Lower Extremity Assessment Details Lizaola, Date of Service: 10/26/2016 8:15 AM Patient Name: [name] Patient Account Number: [id number] Medical Record Treating RN: Baruch Gouty RN, [name], Velva Harman [id number] Number: Other Clinician: Date of Birth/Sex: 1950-06-17 (65 y.o. Male) Treating [name], [place] Primary Care Cory Rama: Royetta Crochet Neftaly Inzunza/Extender: G Referring Dimitry Holsworth: Doristine Locks in Treatment: 18 Edema Assessment Assessed: [Left: No] [Right: No] E[Left: dema] [Right: :] Calf Left: Right: Point of Measurement: 38 cm From Medial Instep cm 48.2 cm Ankle Left: Right: Point of Measurement: 12 cm From Medial Instep cm 28.2 cm Vascular Assessment Claudication: Claudication Assessment [Right:None] Pulses: Dorsalis Pedis Palpable: [Right:Yes] Posterior Tibial Extremity colors, hair growth, and conditions: Extremity Color: [Right:Hyperpigmented] Hair Growth on Extremity: [Right:Yes] Temperature of Extremity: [Right:Warm] Capillary Refill: [Right:< 3 seconds] Toe Nail Assessment Left: Right: Thick: Yes Discolored: Yes Deformed: No Improper Length and Hygiene: Yes Electronic Signature(s) Signed: 10/26/2016 4:53:20 PM By: [name] [name], RN [name], [name] ([id number]) Entered By: [name] on 10/26/2016 08:11:34 [name], [name]  ([id number]) -------------------------------------------------------------------------------- Multi Wound Chart Details [name], Date of Service: 10/26/2016 8:15 AM Patient Name: [name] Patient Account Number: [id number] Medical Record Treating RN: Baruch Gouty RN, [name], Velva Harman [id number] Number: Other Clinician: Date of Birth/Sex: [birth year] (65 y.o. 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] [N/A:N/A] Comorbid History: [4:Arrhythmia, Hypertension, N/A Gout, Neuropathy] Date Acquired: [4:06/02/2016] [N/A:N/A] Weeks  of Treatment: [4:18] [N/A:N/A] Wound Status: [4:Open] [N/A:N/A] Measurements L x W x D 1.5x2.4x0.2 [N/A:N/A] (cm) Area (cm) : [4:2.827] [N/A:N/A] Volume (cm) : [4:0.565] [N/A:N/A] % Reduction in Area: [4:-9.10%] [N/A:N/A] % Reduction in Volume: -118.10% [N/A:N/A] Classification: [4:Full Thickness Without Exposed Support Structures] [N/A:N/A] Exudate Amount: [4:Large] [N/A:N/A] Exudate Type: [4:Serosanguineous] [N/A:N/A] Exudate Color: [4:red, brown] [N/A:N/A] Wound Margin: [4:Distinct, outline attached N/A] Granulation Amount: [4:Medium (34-66%)] [N/A:N/A] Granulation Quality: [4:Pink, Pale] [N/A:N/A] Necrotic Amount: [4:Small (1-33%)] [N/A:N/A] Exposed Structures: [4:Fat Layer (Subcutaneous N/A Tissue) Exposed: Yes Fascia: No] Tendon: No Muscle: No Joint: No Bone: No Epithelialization: Small (1-33%) N/A N/A Debridement: Debridement (82423- N/A N/A 53614) Pre-procedure 08:24 N/A N/A Verification/Time Out Taken: Pain  Control: Lidocaine 4% Topical N/A N/A Solution Tissue Debrided: Fibrin/Slough, N/A N/A Subcutaneous Level: Skin/Subcutaneous N/A N/A Tissue Debridement Area (sq 3.6 N/A N/A cm): Instrument: Curette N/A N/A Bleeding: Minimum N/A N/A Hemostasis Achieved: Pressure N/A N/A Procedural Pain: 0 N/A N/A Post Procedural Pain: 0 N/A N/A Debridement Treatment Procedure was tolerated N/A N/A Response: well Post Debridement 1.5x2.4x0.2 N/A N/A Measurements L x W x D (cm) Post Debridement 0.565 N/A N/A Volume: (cm) Periwound Skin Texture: Scarring: Yes N/A N/A Excoriation: No Induration: No Callus: No Crepitus: No Rash: No Periwound Skin Maceration: No N/A N/A Moisture: Dry/Scaly: No Periwound Skin Color: Hemosiderin Staining: Yes N/A N/A Atrophie Blanche: No Cyanosis: No Ecchymosis: No Erythema: No Mottled: No Pallor: No Rubor: No Temperature: No Abnormality N/A N/A Tenderness on Yes N/A N/A Palpation: Wound Preparation: N/A N/A [name], [name] ([id number]) Ulcer Cleansing: Rinsed/Irrigated with Saline, Other: surg scrub and water Topical Anesthetic Applied: Other: lidocaine 4% Procedures Performed: Debridement N/A N/A Treatment Notes Electronic Signature(s) Signed: 10/26/2016 6:01:27 PM By: [name] MD Entered By: [name] on 10/26/2016 08:29:31 Bojarski, [name] ([id number]) -------------------------------------------------------------------------------- Multi-Disciplinary Care Plan Details Kowalski, Date of Service: 10/26/2016 8:15 AM Patient Name: [name] Patient Account Number: [id number] Medical Record Treating RN: Baruch Gouty RN, [name], Velva Harman [id number] Number: Other Clinician: Date of Birth/Sex: 26-Jun-1950 (66 y.o. Male) Treating [name], [place] Primary Care Jerren Flinchbaugh: Royetta Crochet Preston Garabedian/Extender: G Referring Nickolaos Brallier: Doristine Locks in Treatment: 18 Active Inactive ` Orientation to the Wound Care Program Nursing  Diagnoses: Knowledge deficit related to the wound healing [name] program Goals: Patient/caregiver will verbalize understanding of the [facility] Program Date Initiated: 06/16/2016 Target Resolution Date: 08/15/2016 Goal Status: Active Interventions: Provide education on orientation to the wound [name] Notes: ` Venous Leg Ulcer Nursing Diagnoses: Potential for venous Insuffiency (use before diagnosis confirmed) Goals: Patient will maintain optimal edema control Date Initiated: 06/16/2016 Target Resolution Date: 08/19/2016 Goal Status: Active Interventions: Compression as ordered Notes: ` Wound/Skin Impairment Nursing Diagnoses: [name], [name] ([id number]) Impaired tissue integrity Goals: Patient/caregiver will verbalize understanding of skin care regimen Date Initiated: 06/16/2016 Target Resolution Date: 07/15/2016 Goal Status: Active Ulcer/skin breakdown will have a volume reduction of 30% by week 4 Date Initiated: 06/16/2016 Target Resolution Date: 07/15/2016 Goal Status: Active Ulcer/skin breakdown will have a volume reduction of 50% by week 8 Date Initiated: 06/16/2016 Target Resolution Date: 07/15/2016 Goal Status: Active Ulcer/skin breakdown will have a volume reduction of 80% by week 12 Date Initiated: 06/16/2016 Target Resolution Date: 07/15/2016 Goal Status: Active Ulcer/skin breakdown will heal within 14 weeks Date Initiated: 06/16/2016 Target Resolution Date: 07/15/2016 Goal Status: Active Interventions: Assess patient/caregiver ability to obtain necessary supplies Assess patient/caregiver ability to perform ulcer/skin care regimen upon admission and as needed Assess ulceration(s) every visit Notes: Electronic Signature(s) Signed: 10/26/2016 4:53:20 PM By: [name] [name],  RN Entered By: [name] on 10/26/2016 08:25:54 [name], [name] ([id number]) -------------------------------------------------------------------------------- Pain Assessment  Details [place], Date of Service: 10/26/2016 8:15 AM Patient Name: [name] Patient Account Number: [id number] Medical Record Treating RN: Afful, RN, [name], Velva Harman [id number] Number: Other Clinician: Date of Birth/Sex: [birth year] (66 y.o. Male) Treating [name], MICHAEL Primary Care Vernon Maish: Royetta Crochet Mitsuko Luera/Extender: G Referring Alianys Chacko: Doristine Locks in Treatment: 18 Active Problems Location of Pain Severity and Description of Pain Patient Has Paino No Site Locations With Dressing Change: No Pain Management and Medication Current Pain Management: Electronic Signature(s) Signed: 10/26/2016 4:53:20 PM By: [name] [name], RN Entered By: [name] on 10/26/2016 08:08:33 [name], [name] ([id number]) -------------------------------------------------------------------------------- Patient/Caregiver Education Details [name], Date of Service: 10/26/2016 8:15 AM Patient Name: [name] Patient Account Number: [id number] Medical Record Treating RN: Baruch Gouty, RN, [name], Velva Harman [id number] Number: Other Clinician: Date of Birth/Gender: 09/10/1950 (66 y.o. Male) Treating [name], MICHAEL Primary Care Physician/Extender: Tonye Pearson Physician: Weeks in Treatment: 18 Referring Physician: Royetta Crochet Education Assessment Education Provided To: Patient Education Topics Provided Welcome To [facility]: Methods: Explain/Verbal Responses: State content correctly Wound Debridement: Methods: Explain/Verbal Responses: State content correctly Wound/Skin Impairment: Methods: Explain/Verbal Responses: State content correctly Electronic Signature(s) Signed: 10/26/2016 4:53:20 PM By: [name] [name], RN Entered By: [name] on 10/26/2016 08:38:34 [name], [name] ([id number]) -------------------------------------------------------------------------------- Wound Assessment Details [place], Date of Service: 10/26/2016 8:15 AM Patient Name: [name] Patient  Account Number: [id number] Medical Record Treating RN: Baruch Gouty, RN, [name], Velva Harman [id number] Number: Other Clinician: Date of Birth/Sex: 01/10/1951 (66 y.o. Male) Treating [name], MICHAEL Primary Care Lidwina Kaner: Royetta Crochet Osher Oettinger/Extender: G Referring Yukio Bisping: Doristine Locks in Treatment: 18 Wound Status Wound Number: 4 Primary Venous Leg Ulcer Etiology: Wound Location: Right Lower Leg - Medial Wound Status: Open Wounding Event: Gradually Appeared Comorbid Arrhythmia, Hypertension, Gout, Date Acquired: 06/02/2016 History: Neuropathy Weeks Of Treatment: 18 Clustered Wound: No Photos Photo Uploaded By: [name] on 10/26/2016 17:00:02 Wound Measurements Length: (cm) 1.5 Width: (cm) 2.4 Depth: (cm) 0.2 Area: (cm) 2.827 Volume: (cm) 0.565 % Reduction in Area: -9.1% % Reduction in Volume: -118.1% Epithelialization: Small (1-33%) Tunneling: No Undermining: No Wound Description Full Thickness Without Exposed Classification: Support Structures Wound Margin: Distinct, outline attached [place], Norah ([id number]) Foul Odor After Cleansing: No Slough/Fibrino Yes Exudate Large Amount: Exudate Type: Serosanguineous Exudate Color: red, brown Wound Bed Granulation Amount: Medium (34-66%) Exposed Structure Granulation Quality: Pink, Pale Fascia Exposed: No Necrotic Amount: Small (1-33%) Fat Layer (Subcutaneous Tissue) Exposed: Yes Necrotic Quality: Adherent Slough Tendon Exposed: No Muscle Exposed: No Joint Exposed: No Bone Exposed: No Periwound Skin Texture Texture Color No Abnormalities Noted: No No Abnormalities Noted: No Callus: No Atrophie Blanche: No Crepitus: No Cyanosis: No Excoriation: No Ecchymosis: No Induration: No Erythema: No Rash: No Hemosiderin Staining: Yes Scarring: Yes Mottled: No Pallor: No Moisture Rubor: No No Abnormalities Noted: No Dry / Scaly: No Temperature / Pain Maceration: No Temperature: No Abnormality Tenderness  on Palpation: Yes Wound Preparation Ulcer Cleansing: Rinsed/Irrigated with Saline, Other: surg scrub and water, Topical Anesthetic Applied: Other: lidocaine 4%, Treatment Notes Wound #4 (Right, Medial Lower Leg) 1. Cleansed with: Cleanse wound with antibacterial soap and water 3. Peri-wound Care: Barrier cream Moisturizing lotion 4. Dressing Applied: Hydrafera Blue 5. Secondary Dressing Applied Dry Gauze 7. Secured with 3 Layer Compression System - Right Lower Extremity DAMIRE, REMEDIOS ([id number]) Notes unna to anchor Electronic Signature(s) Signed: 10/26/2016 4:53:20 PM By: [name] [name], RN Entered By: [name] on 10/26/2016 08:20:49 Disla, [name] ([id number]) --------------------------------------------------------------------------------  Vitals Details Chandler, Date of Service: 10/26/2016 8:15 AM Patient Name: KELDRICK Patient Account Number: [id number] Medical Record Treating RN: Afful, RN, [name], Velva Harman [id number] Number: Other Clinician: Date of Birth/Sex: 1950-09-02 (66 y.o. Male) Treating [name], [place] Primary Care Elio Haden: Royetta Crochet Mikai Meints/Extender: G Referring Deyvi Bonanno: Doristine Locks in Treatment: 18 Vital Signs Time Taken: 08:12 Temperature (F): 98.0 Height (in): 74 Pulse (bpm): 56 Weight (lbs): 252 Respiratory Rate (breaths/min): 17 Body Mass Index (BMI): 32.4 Blood Pressure (mmHg): 157/83 Reference Range: 80 - 120 mg / dl Electronic Signature(s) Signed: 10/26/2016 4:53:20 PM By: [name] [name], RN Entered By: [name] on 10/26/2016 08:13:01
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
## 2016-11-02 ENCOUNTER — Encounter: Payer: Medicare Other | Admitting: Internal Medicine

## 2016-11-02 DIAGNOSIS — I87331 Chronic venous hypertension (idiopathic) with ulcer and inflammation of right lower extremity: Secondary | ICD-10-CM | POA: Diagnosis not present

## 2016-11-04 NOTE — Progress Notes (Signed)
[name], [name] ([id number]) Visit Report for 11/02/2016 Arrival Information [name] [name], Date of Service: 11/02/2016 8:15 [name] Patient Name: [name] Patient Account Number: [id number] Medical Record Treating [name]: [name] [id number] Number: Other Clinician: Date of Birth/Sex: 1950/06/18 (66 y.o. Male) Treating [name] Versie Soave: [name] [name]/Extender: G Referring [name]: [name] in Treatment: 67 Visit Information History Since Last Visit Added or deleted any medications: No Patient Arrived: Ambulatory Any new allergies or adverse reactions: No Arrival Time: 08:11 Had a fall or experienced change in No Accompanied By: self activities of daily living that may affect Transfer Assistance: None risk of falls: Patient Identification Verified: Yes Signs or symptoms of abuse/neglect since last No Secondary Verification Process Yes visito Completed: Hospitalized since last visit: No Patient Requires Transmission- No Has Dressing in Place as Prescribed: Yes Based Precautions: Pain Present Now: No Patient Has Alerts: Yes Patient Alerts: Patient on Blood Thinner warfarin Electronic Signature(s) Signed: 11/03/2016 8:26:41 [name] By: [name], [name], [name], [name], [name], [name] Entered By: [name], [name], [name], [name], Mark on 11/02/2016 08:12:08 [name] ([id number]) -------------------------------------------------------------------------------- Compression Therapy [name] Row, Date of Service: 11/02/2016 8:15 [name] Patient Name: [name] Patient Account Number: [id number] Medical Record Treating [name]: [name] [id number] Number: Other Clinician: Date of Birth/Sex: [birth year] (66 y.o. Male) Treating [name] Kimmie Berggren: [name] [name]/Extender: G Referring Alyiah Ulloa: [name] in Treatment: 19 Compression Therapy Performed for Wound Wound #4 Right,Medial Mark Leg Assessment: Performed By: Clinician  Mark Barman, [name] Compression Type: Four Layer Pre Treatment ABI: 1 Post Procedure Diagnosis Same as Pre-procedure Electronic Signature(s) Signed: 11/02/2016 12:08:03 PM By: [name], [name], [name], [name], [name], [name] Entered By: [name], [name], [name], [name], Mark on 11/02/2016 12:08:03 [name] ([id number]) -------------------------------------------------------------------------------- Encounter Discharge Information [name] [name], Date of Service: 11/02/2016 8:15 [name] Patient Name: [name] Patient Account Number: [id number] Medical Record Treating [name]: Baruch Gouty, [name], [name], [name] [id number] Number: Other Clinician: Date of Birth/Sex: 1950-09-13 (66 y.o. Male) Treating [name] [name]: [name] Sabrie Moritz/Extender: G Referring Wilfrid Hyser: [name] in Treatment: 56 Encounter Discharge Information Items Discharge Pain Level: 0 Discharge Condition: Stable Ambulatory Status: Ambulatory Discharge Destination: Home Transportation: Private Auto Accompanied By: self Schedule Follow-up Appointment: Yes Medication Reconciliation completed Yes and provided to Patient/[name] [name]: Patient Clinical Summary of [name]: Declined Electronic Signature(s) Signed: 11/03/2016 8:26:41 [name] By: [name], [name], [name], [name], [name], [name] Previous Signature: 11/02/2016 8:34:37 [name] Version By: Ruthine Dose Entered By: [name], [name], [name], [name], Mark on 11/02/2016 08:37:51 [name] ([id number]) -------------------------------------------------------------------------------- [name] [name], Date of Service: 11/02/2016 8:15 [name] Patient Name: [name] Patient Account Number: [id number] Medical Record Treating [name]: [name] [id number] Number: Other Clinician: Date of Birth/Sex: 1950-12-22 (66 y.o. Male) Treating [name] Primary [name] Hong Moring: [name] [name] Schweppe/Extender: G Referring Kathi Dohn: [name] in Treatment: 19 Edema  Assessment Assessed: [Left: No] [Right: No] E[Left: dema] [Right: :] Calf Left: Right: Point of Measurement: 38 cm From Medial Instep cm 43.7 cm Ankle Left: Right: Point of Measurement: 12 cm From Medial Instep cm 30 cm Vascular Assessment Claudication: Claudication Assessment [Right:None] Pulses: Dorsalis Pedis Palpable: [Right:Yes] Posterior Tibial Extremity colors, hair growth, and conditions: Extremity Color: [Right:Hyperpigmented] Hair Growth on Extremity: [Right:Yes] Temperature of Extremity: [Right:Warm] Capillary Refill: [Right:< 3 seconds] Dependent Rubor: [Right:No] Blanched when Elevated: [Right:No] Lipodermatosclerosis: [Right:No] Toe Nail Assessment Left: Right: Thick: Yes Discolored: Yes Deformed: No Improper Length and Hygiene: No [name], [name] ([id number]) Electronic Signature(s) Signed: 11/03/2016 8:26:41 [name] By: [name], [name], [name],  [name], [name], [name] Entered By: [name], [name], [name], [name], Mark on 11/02/2016 08:16:18 [name] ([id number]) -------------------------------------------------------------------------------- Multi Wound Chart [name] [name] Patient Name: [name] Patient Account Number: [id number] Medical Record Treating [name]: [name] [id number] Number: Other Clinician: Date of Birth/Sex: [birth year] (66 y.o. Male) Treating [name] Primary [name] Vuong Musa: [name] [name]/Extender: G Referring Ziyan Hillmer: [name] in Treatment: 19 Vital Signs Height(in): 74 Pulse(bpm): 53 Weight(lbs): 252 Blood Pressure 139/85 (mmHg): Body Mass Index(BMI): 32 Temperature(F): 97.9 Respiratory Rate 18 (breaths/min): Photos: [N/A:N/A] Wound Location: Right Mark Leg - Medial N/A N/A Wounding Event: Gradually Appeared N/A N/A Primary Etiology: Venous Leg Ulcer N/A N/A Comorbid History: Arrhythmia, Hypertension, N/A N/A Gout, Neuropathy Date Acquired: 06/02/2016 N/A N/A Weeks of  Treatment: 19 N/A N/A Wound Status: Open N/A N/A Measurements L x W x D 1.6x2.4x0.2 N/A N/A (cm) Area (cm) : 3.016 N/A N/A Volume (cm) : 0.603 N/A N/A % Reduction in Area: -16.40% N/A N/A % Reduction in Volume: -132.80% N/A N/A Classification: Full Thickness Without N/A N/A Exposed Support Structures Exudate Amount: Large N/A N/A Exudate Type: Serosanguineous N/A N/A Exudate Color: red, brown N/A N/A Wound Margin: Distinct, outline attached N/A N/A Granulation Amount: Medium (34-66%) N/A N/A Lavigne, Lourdes ([id number]) Granulation Quality: Pink, Pale N/A N/A Necrotic Amount: Small (1-33%) N/A N/A Exposed Structures: Fat Layer (Subcutaneous N/A N/A Tissue) Exposed: Yes Fascia: No Tendon: No Muscle: No Joint: No Bone: No Epithelialization: Small (1-33%) N/A N/A Periwound Skin Texture: Scarring: Yes N/A N/A Excoriation: No Induration: No Callus: No Crepitus: No Rash: No Periwound Skin Maceration: No N/A N/A Moisture: Dry/Scaly: No Periwound Skin Color: Hemosiderin Staining: Yes N/A N/A Atrophie Blanche: No Cyanosis: No Ecchymosis: No Erythema: No Mottled: No Pallor: No Rubor: No Temperature: No Abnormality N/A N/A Tenderness on Yes N/A N/A Palpation: Wound Preparation: Ulcer Cleansing: N/A N/A Rinsed/Irrigated with Saline, Other: surg scrub and water Topical Anesthetic Applied: Other: lidocaine 4% Treatment Notes Electronic Signature(s) Signed: 11/02/2016 4:47:51 PM By: Mark Ham MD Entered By: [name] on 11/02/2016 08:29:19 Laris, [name] ([id number]) -------------------------------------------------------------------------------- Multi-Disciplinary [name] Plan [name] Mikus, Date of Service: 11/02/2016 8:15 [name] Patient Name: [name] Patient Account Number: [id number] Medical Record Treating [name]: [name] [id number] Number: Other Clinician: Date of Birth/Sex: 23-Feb-1951 (66 y.o. Male) Treating [name], [place] Primary [name]  Shivaan Tierno: [name] Brian Kocourek/Extender: G Referring Deundre Thong: [name] in Treatment: 78 Active Inactive ` Orientation to the Wound [name] Program Nursing Diagnoses: Knowledge deficit related to the wound healing [name] program Goals: Patient/caregiver will verbalize understanding of the [place] Program Date Initiated: 06/16/2016 Target Resolution Date: 08/15/2016 Goal Status: Active Interventions: Provide education on orientation to the wound [name] Notes: ` Venous Leg Ulcer Nursing Diagnoses: Potential for venous Insuffiency (use before diagnosis confirmed) Goals: Patient will maintain optimal edema control Date Initiated: 06/16/2016 Target Resolution Date: 08/19/2016 Goal Status: Active Interventions: Compression as ordered Notes: ` Wound/Skin Impairment Nursing Diagnoses: WYMAN, MESCHKE ([id number]) Impaired tissue integrity Goals: Patient/caregiver will verbalize understanding of skin [name] regimen Date Initiated: 06/16/2016 Target Resolution Date: 07/15/2016 Goal Status: Active Ulcer/skin breakdown will have a volume reduction of 30% by week 4 Date Initiated: 06/16/2016 Target Resolution Date: 07/15/2016 Goal Status: Active Ulcer/skin breakdown will have a volume reduction of 50% by week 8 Date Initiated: 06/16/2016 Target Resolution Date: 07/15/2016 Goal Status: Active Ulcer/skin breakdown will have a volume reduction of 80% by week 12 Date Initiated: 06/16/2016 Target Resolution Date: 07/15/2016 Goal Status: Active Ulcer/skin breakdown will heal  within 14 weeks Date Initiated: 06/16/2016 Target Resolution Date: 07/15/2016 Goal Status: Active Interventions: Assess patient/caregiver ability to obtain necessary supplies Assess patient/caregiver ability to perform ulcer/skin [name] regimen upon admission and as needed Assess ulceration(s) every visit Notes: Electronic Signature(s) Signed: 11/03/2016 8:26:41 [name] By: [name], [name], [name], [name], [name],  [name] Entered By: [name], [name], [name], [name], Mark on 11/02/2016 08:18:13 Honor, [name] ([id number]) -------------------------------------------------------------------------------- Pain Assessment [name] Jurich, Date of Service: 11/02/2016 8:15 [name] Patient Name: [name] Patient Account Number: [id number] Medical Record Treating [name]: [name] [id number] Number: Other Clinician: Date of Birth/Sex: [birth year] (66 y.o. Male) Treating [name] Primary [name] Ramez Arrona: [name] Kiaraliz Rafuse/Extender: G Referring Aylssa Herrig: [name] in Treatment: 19 Active Problems Location of Pain Severity and Description of Pain Patient Has Paino Yes Site Locations Pain Location: Generalized Pain With Dressing Change: Yes Duration of the Pain. Constant / Intermittento Intermittent Rate the pain. Current Pain Level: 5 Pain Management and Medication Current Pain Management: Goals for Pain Management Topical or injectable lidocaine is offered to patient for acute pain when surgical debridement is performed. If needed, Patient is instructed to use over the counter pain medication for the following 24-48 hours after debridement. Wound [name] MDs do not prescribed pain medications. Patient has chronic pain or uncontrolled pain. Patient has been instructed to make an appointment with their Primary [name] Physician for pain management. Electronic Signature(s) Signed: 11/03/2016 8:26:41 [name] By: [name], [name], [name], [name], [name], [name] Entered By: [name], [name], [name], [name], Mark on 11/02/2016 08:12:27 [name] ([id number]) -------------------------------------------------------------------------------- Patient/Caregiver Education [name] Vanhoesen, Date of Service: 11/02/2016 8:15 [name] Patient Name: [facility] [name] Patient Account Number: [id number] Medical Record Treating [name]: [name] [id number] Number: Other Clinician: Date of Birth/Gender: Sep 15, 1950 (66 y.o. Male) Treating [name] Primary [name]  Physician/Extender: Tonye Pearson Physician: Suella [place] in Treatment: 19 Referring Physician: Royetta [name] Education Assessment Education Provided To: Patient Education Topics Provided Venous: Handouts: Controlling Swelling with Multilayered Compression Wraps Methods: Demonstration Responses: State content correctly Welcome To The [place]: Wound/Skin Impairment: Handouts: Caring for Your Ulcer Methods: Demonstration Responses: State content correctly Electronic Signature(s) Signed: 11/03/2016 8:26:41 [name] By: [name], [name], [name], [name], [name], [name] Entered By: [name], [name], [name], [name], Mark on 11/02/2016 08:38:11 Lamison, [name] ([id number]) -------------------------------------------------------------------------------- Wound Assessment [name] Eagleson, Date of Service: 11/02/2016 8:15 [name] Patient Name: [name] Patient Account Number: [id number] Medical Record Treating [name]: [name] [id number] Number: Other Clinician: Date of Birth/Sex: [birth year] (66 y.o. Male) Treating [name], [place] Primary [name] Esme Durkin: [name] Izaiha Lo/Extender: G Referring Idy Rawling: [name] in Treatment: 19 Wound Status Wound Number: 4 Primary Venous Leg Ulcer Etiology: Wound Location: Right Mark Leg - Medial Wound Status: Open Wounding Event: Gradually Appeared Comorbid Arrhythmia, Hypertension, Gout, Date Acquired: 06/02/2016 History: Neuropathy Weeks Of Treatment: 19 Clustered Wound: No Photos Wound Measurements Length: (cm) 1.6 Width: (cm) 2.4 Depth: (cm) 0.2 Area: (cm) 3.016 Volume: (cm) 0.603 % Reduction in Area: -16.4% % Reduction in Volume: -132.8% Epithelialization: Small (1-33%) Tunneling: No Undermining: No Wound Description Full Thickness Without Exposed Classification: Support Structures Wound Margin: Distinct, outline attached Exudate Large Amount: Exudate Type: Serosanguineous Exudate Color: red, brown Foul Odor After Cleansing:  No Slough/Fibrino Yes Wound Bed Granulation Amount: Medium (34-66%) Exposed Structure Granulation Quality: Pink, Pale Fascia Exposed: No Necrotic Amount: Small (1-33%) Fat Layer (Subcutaneous Tissue) Exposed: Yes Necrotic Quality: Adherent Slough Tendon Exposed: No Newlon, Khriz ([id number]) Muscle Exposed: No Joint Exposed: No Bone Exposed: No Periwound Skin Texture Texture Color No Abnormalities Noted: No No  Abnormalities Noted: No Callus: No Atrophie Blanche: No Crepitus: No Cyanosis: No Excoriation: No Ecchymosis: No Induration: No Erythema: No Rash: No Hemosiderin Staining: Yes Scarring: Yes Mottled: No Pallor: No Moisture Rubor: No No Abnormalities Noted: No Dry / Scaly: No Temperature / Pain Maceration: No Temperature: No Abnormality Tenderness on Palpation: Yes Wound Preparation Ulcer Cleansing: Rinsed/Irrigated with Saline, Other: surg scrub and water, Topical Anesthetic Applied: Other: lidocaine 4%, Treatment Notes Wound #4 (Right, Medial Mark Leg) 1. Cleansed with: Clean wound with Normal Saline Cleanse wound with antibacterial soap and water 2. Anesthetic Topical Lidocaine 4% cream to wound bed prior to debridement 3. Peri-wound [name]: Barrier cream 4. Dressing Applied: Iodosorb Ointment 5. Secondary Dressing Applied ABD Pad 7. Secured with 4-Layer Compression System - Right Mark Extremity Notes unna to Engineer, production) Signed: 11/03/2016 8:26:41 [name] By: [name], [name], [name], [name], [name], [name] Entered By: [name], [name], [name], [name], Mark on 11/02/2016 08:14:35 Manter, [name] ([id number]) -------------------------------------------------------------------------------- Vitals [name] Stratton, Date of Service: 11/02/2016 8:15 [name] Patient Name: [name] Patient Account Number: [id number] Medical Record Treating [name]: [name] [id number] Number: Other Clinician: Date of Birth/Sex: Feb 01, 1951 (66 y.o. Male) Treating [name],  [place] Primary [name] Mckinley Adelstein: [name] Fedrick Cefalu/Extender: G Referring Chino Sardo: [name] in Treatment: 19 Vital Signs Time Taken: 08:12 Temperature (F): 97.9 Height (in): 74 Pulse (bpm): 53 Weight (lbs): 252 Respiratory Rate (breaths/min): 18 Body Mass Index (BMI): 32.4 Blood Pressure (mmHg): 139/85 Reference Range: 80 - 120 mg / dl Electronic Signature(s) Signed: 11/03/2016 8:26:41 [name] By: [name], [name], [name], [name], [name], [name] Entered By: [name], [name], [name], [name], Mark on 11/02/2016 08:12:53
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
## 2016-11-09 ENCOUNTER — Encounter: Payer: Medicare Other | Admitting: Internal Medicine

## 2016-11-09 DIAGNOSIS — I87331 Chronic venous hypertension (idiopathic) with ulcer and inflammation of right lower extremity: Secondary | ICD-10-CM | POA: Diagnosis not present

## 2016-11-10 NOTE — Progress Notes (Signed)
[name], [name] ([id number]) Visit Report for 11/09/2016 Arrival Information [name] Karstens, Date of Service: 11/09/2016 8:15 AM Patient Name: [name] Patient Account Number: [id number] Medical Record Treating RN: Cornell Barman [id number] Number: Other Clinician: Date of Birth/Sex: 1950/12/22 (66 y.o. Male) Treating [name] Primary Care Sal Spratley: Royetta Crochet Janiel Crisostomo/Extender: G Referring Juleon Narang: Doristine Locks in Treatment: 20 Visit Information History Since Last Visit Added or deleted any medications: No Patient Arrived: Ambulatory Any new allergies or adverse reactions: No Arrival Time: 08:18 Had a fall or experienced change in No Accompanied By: self activities of daily living that may affect Transfer Assistance: None risk of falls: Patient Identification Verified: Yes Signs or symptoms of abuse/neglect since last No Secondary Verification Process Yes visito Completed: Hospitalized since last visit: No Patient Requires Transmission- No Has Dressing in Place as Prescribed: Yes Based Precautions: Pain Present Now: Yes Patient Has Alerts: Yes Patient Alerts: Patient on Blood Thinner warfarin Electronic Signature(s) Signed: 11/09/2016 4:42:12 PM By: Gretta Cool, [name], RN, CWS, [name], [name] Entered By: Gretta Cool, [name], RN, CWS, Kim on 11/09/2016 08:19:06 [name], [name] ([id number]) -------------------------------------------------------------------------------- Compression Therapy [name] [name], Date of Service: 11/09/2016 8:15 AM Patient Name: [name] Patient Account Number: [id number] Medical Record Treating RN: Cornell Barman [id number] Number: Other Clinician: Date of Birth/Sex: Nov 08, 1950 (66 y.o. Male) Treating [name] Primary Care Emmarie Sannes: Royetta Crochet Roslynn Holte/Extender: G Referring Shelagh Rayman: Doristine Locks in Treatment: 20 Compression Therapy Performed for Wound Wound #4 Right,Medial Mark Leg Assessment: Performed By: Clinician  Cornell Barman, RN Compression Type: Four Layer Pre Treatment ABI: 1 Post Procedure Diagnosis Same as Pre-procedure Electronic Signature(s) Signed: 11/09/2016 4:42:12 PM By: Gretta Cool, [name], RN, CWS, [name], [name] Entered By: Gretta Cool, [name], RN, CWS, Kim on 11/09/2016 08:37:42 [name] -------------------------------------------------------------------------------- Encounter Discharge Information [name] [name], Date of Service: 11/09/2016 8:15 AM Patient Name: [name] Patient Account Number: [id number] Medical Record Treating RN: Baruch Gouty, RN, [name], Velva Harman [id number] Number: Other Clinician: Date of Birth/Sex: 08/05/1950 (66 y.o. Male) Treating [name] Primary Care Melony Tenpas: Royetta Crochet Anaisa Radi/Extender: G Referring Gaylin Bulthuis: Doristine Locks in Treatment: 20 Encounter Discharge Information Items Discharge Pain Level: 0 Discharge Condition: Stable Ambulatory Status: Ambulatory Discharge Destination: Home Transportation: Private Auto Accompanied By: self Schedule Follow-up Appointment: Yes Medication Reconciliation completed Yes and provided to Patient/Care Terrye Dombrosky: Patient Clinical Summary of Care: Declined Electronic Signature(s) Signed: 11/09/2016 9:10:12 AM By: Gretta Cool, [name], RN, CWS, [name], [name] Previous Signature: 11/09/2016 8:52:19 AM Version By: Ruthine Dose Entered By: Gretta Cool, [name], RN, CWS, Kim on 11/09/2016 09:10:12 [name] -------------------------------------------------------------------------------- [name] [name], Date of Service: 11/09/2016 8:15 AM Patient Name: [name] Patient Account Number: [id number] Medical Record Treating RN: Cornell Barman [id number] Number: Other Clinician: Date of Birth/Sex: 1951/03/23 (66 y.o. Male) Treating [name], MICHAEL Primary Care Darla Mcdonald: Royetta Crochet Kiaira Pointer/Extender: G Referring Joli Koob: Doristine Locks in Treatment: 20 Vascular  Assessment Pulses: Dorsalis Pedis Palpable: [Right:Yes] Posterior Tibial Extremity colors, hair growth, and conditions: Extremity Color: [Right:Hyperpigmented] Hair Growth on Extremity: [Right:No] Temperature of Extremity: [Right:Warm] Capillary Refill: [Right:> 3 seconds] Dependent Rubor: [Right:No] Blanched when Elevated: [Right:No] Lipodermatosclerosis: [Right:No] Toe Nail Assessment Left: Right: Thick: Yes Discolored: Yes Deformed: Yes Improper Length and Hygiene: Yes Electronic Signature(s) Signed: 11/09/2016 4:42:12 PM By: Gretta Cool, [name], RN, CWS, [name], [name] Entered By: Gretta Cool, [name], RN, CWS, Kim on 11/09/2016 08:21:44 [name] -------------------------------------------------------------------------------- [name] [name], Date of Service: 11/09/2016 8:15 AM Patient Name: [name] Patient Account Number: [id number] Medical Record Treating RN: Cornell Barman [id number] Number: Other Clinician:  Date of Birth/Sex: [birth year] (66 y.o. Male) Treating [name], [place] Primary Care Moses Odoherty: Royetta Crochet Deshundra Waller/Extender: G Referring Raneen Jaffer: Doristine Locks in Treatment: 42 Photos: [4:No Photos] [N/A:N/A] Wound Location: [4:Right Mark Leg - Medial N/A] Wounding Event: [4:Gradually Appeared] [N/A:N/A] Primary Etiology: [4:Venous Leg Ulcer] [N/A:N/A] Comorbid History: [4:Arrhythmia, Hypertension, N/A Gout, Neuropathy] Date Acquired: [4:06/02/2016] [N/A:N/A] Weeks of Treatment: [4:20] [N/A:N/A] Wound Status: [4:Open] [N/A:N/A] Measurements L x W x D 2.8x2.5x0.3 [N/A:N/A] (cm) Area (cm) : [4:5.498] [N/A:N/A] Volume (cm) : [4:1.649] [N/A:N/A] % Reduction in Area: [4:-112.10%] [N/A:N/A] % Reduction in Volume: -536.70% [N/A:N/A] Classification: [4:Full Thickness Without Exposed Support Structures] [N/A:N/A] Exudate Amount: [4:Large] [N/A:N/A] Exudate Type: [4:Serosanguineous] [N/A:N/A] Exudate Color: [4:red, brown]  [N/A:N/A] Wound Margin: [4:Distinct, outline attached N/A] Granulation Amount: [4:Medium (34-66%)] [N/A:N/A] Granulation Quality: [4:Pink, Pale, Hyper- granulation] [N/A:N/A] Necrotic Amount: [4:Small (1-33%)] [N/A:N/A] Exposed Structures: [4:Fat Layer (Subcutaneous N/A Tissue) Exposed: Yes Fascia: No Tendon: No Muscle: No Joint: No Bone: No] Epithelialization: [4:Small (1-33%)] [N/A:N/A] Periwound Skin Texture: Scarring: Yes [4:Excoriation: No] [N/A:N/A] Induration: No Callus: No Crepitus: No Rash: No Periwound Skin Maceration: No N/A N/A Moisture: Dry/Scaly: No Periwound Skin Color: Hemosiderin Staining: Yes N/A N/A Atrophie Blanche: No Cyanosis: No Ecchymosis: No Erythema: No Mottled: No Pallor: No Rubor: No Temperature: No Abnormality N/A N/A Tenderness on Yes N/A N/A Palpation: Wound Preparation: Ulcer Cleansing: N/A N/A Rinsed/Irrigated with Saline, Other: surg scrub and water Topical Anesthetic Applied: Other: lidocaine 4% Procedures Performed: Compression Therapy N/A N/A Treatment Notes Electronic Signature(s) Signed: 11/09/2016 4:49:17 PM By: [name] MD Entered By: [name] on 11/09/2016 08:38:45 Krasowski, [name] ([id number]) -------------------------------------------------------------------------------- Mark-Disciplinary Care Plan [name] Gurley, Date of Service: 11/09/2016 8:15 AM Patient Name: [name] Patient Account Number: [id number] Medical Record Treating RN: Cornell Barman [id number] Number: Other Clinician: Date of Birth/Sex: [birth year] (66 y.o. Male) Treating [name], [place] Primary Care Jun Osment: Royetta Crochet Pearl Bents/Extender: G Referring Maleigha Colvard: Doristine Locks in Treatment: 20 Active Inactive ` Orientation to the Wound Care Program Nursing Diagnoses: Knowledge deficit related to the wound healing [name] program Goals: Patient/caregiver will verbalize understanding of the [facility] Program Date  Initiated: 06/16/2016 Target Resolution Date: 08/15/2016 Goal Status: Active Interventions: Provide education on orientation to the wound [name] Notes: ` Venous Leg Ulcer Nursing Diagnoses: Potential for venous Insuffiency (use before diagnosis confirmed) Goals: Patient will maintain optimal edema control Date Initiated: 06/16/2016 Target Resolution Date: 08/19/2016 Goal Status: Active Interventions: Compression as ordered Notes: ` Wound/Skin Impairment Nursing Diagnoses: MONTARIO, ZILKA ([id number]) Impaired tissue integrity Goals: Patient/caregiver will verbalize understanding of skin care regimen Date Initiated: 06/16/2016 Target Resolution Date: 07/15/2016 Goal Status: Active Ulcer/skin breakdown will have a volume reduction of 30% by week 4 Date Initiated: 06/16/2016 Target Resolution Date: 07/15/2016 Goal Status: Active Ulcer/skin breakdown will have a volume reduction of 50% by week 8 Date Initiated: 06/16/2016 Target Resolution Date: 07/15/2016 Goal Status: Active Ulcer/skin breakdown will have a volume reduction of 80% by week 12 Date Initiated: 06/16/2016 Target Resolution Date: 07/15/2016 Goal Status: Active Ulcer/skin breakdown will heal within 14 weeks Date Initiated: 06/16/2016 Target Resolution Date: 07/15/2016 Goal Status: Active Interventions: Assess patient/caregiver ability to obtain necessary supplies Assess patient/caregiver ability to perform ulcer/skin care regimen upon admission and as needed Assess ulceration(s) every visit Notes: Electronic Signature(s) Signed: 11/09/2016 4:42:12 PM By: Gretta Cool, [name], RN, CWS, [name], [name] Entered By: Gretta Cool, [name], RN, CWS, Kim on 11/09/2016 08:31:56 Foiles, [name] ([id number]) -------------------------------------------------------------------------------- Pain Assessment [name] Aven, Date of Service: 11/09/2016 8:15 AM Patient Name: [name] Patient Account Number: [id number]  Medical Record Treating RN: Cornell Barman [id number] Number: Other Clinician: Date of Birth/Sex: December 15, 1950 (66 y.o. Male) Treating [name], MICHAEL Primary Care Prima Rayner: Royetta Crochet Symphoni Helbling/Extender: G Referring Rogen Porte: Doristine Locks in Treatment: 20 Active Problems Location of Pain Severity and Description of Pain Patient Has Paino Yes Site Locations Pain Location: Pain in Ulcers With Dressing Change: No Rate the pain. Current Pain Level: 8 Character of Pain Describe the Pain: Aching, Burning Pain Management and Medication Current Pain Management: Goals for Pain Management Topical or injectable lidocaine is offered to patient for acute pain when surgical debridement is performed. If needed, Patient is instructed to use over the counter pain medication for the following 24-48 hours after debridement. Wound care MDs do not prescribed pain medications. Patient has chronic pain or uncontrolled pain. Patient has been instructed to make an appointment with their Primary Care Physician for pain management. Electronic Signature(s) Signed: 11/09/2016 4:42:12 PM By: Gretta Cool, [name], RN, CWS, [name], [name] Entered By: Gretta Cool, [name], RN, CWS, Kim on 11/09/2016 08:19:44 Glance, [name] ([id number]) -------------------------------------------------------------------------------- Patient/Caregiver Education [name] Koci, Date of Service: 11/09/2016 8:15 AM Patient Name: [facility] [name] Patient Account Number: [id number] Medical Record Treating RN: Cornell Barman [id number] Number: Other Clinician: Date of Birth/Gender: 03/02/1951 (66 y.o. Male) Treating [name], MICHAEL Primary Care Physician/Extender: Tonye Pearson Physician: Suella Grove in Treatment: 20 Referring Physician: Royetta Crochet Education Assessment Education Provided To: Patient Education Topics Provided Wound/Skin Impairment: Handouts: Caring for Your Ulcer Methods: Demonstration Responses: State content correctly Electronic Signature(s) Signed: 11/09/2016  4:42:12 PM By: Gretta Cool, [name], RN, CWS, [name], [name] Entered By: Gretta Cool, [name], RN, CWS, Kim on 11/09/2016 09:10:29 Bonillas, [name] ([id number]) -------------------------------------------------------------------------------- Wound Assessment [name] Finfrock, Date of Service: 11/09/2016 8:15 AM Patient Name: [name] Patient Account Number: [id number] Medical Record Treating RN: Cornell Barman [id number] Number: Other Clinician: Date of Birth/Sex: October 30, 1950 (66 y.o. Male) Treating [name], MICHAEL Primary Care Lachlyn Vanderstelt: Royetta Crochet Dina Warbington/Extender: G Referring Geet Hosking: Doristine Locks in Treatment: 20 Wound Status Wound Number: 4 Primary Venous Leg Ulcer Etiology: Wound Location: Right Mark Leg - Medial Wound Status: Open Wounding Event: Gradually Appeared Comorbid Arrhythmia, Hypertension, Gout, Date Acquired: 06/02/2016 History: Neuropathy Weeks Of Treatment: 20 Clustered Wound: No Photos Photo Uploaded By: Gretta Cool, [name], RN, CWS, Kim on 11/09/2016 09:03:35 Wound Measurements Length: (cm) 2.8 Width: (cm) 2.5 Depth: (cm) 0.3 Area: (cm) 5.498 Volume: (cm) 1.649 % Reduction in Area: -112.1% % Reduction in Volume: -536.7% Epithelialization: Small (1-33%) Tunneling: No Undermining: No Wound Description Full Thickness Without Exposed Classification: Support Structures Wound Margin: Distinct, outline attached Exudate Large Amount: Exudate Type: Serosanguineous Exudate Color: red, brown Foul Odor After Cleansing: No Slough/Fibrino Yes Wound Bed Granulation Amount: Medium (34-66%) Exposed Structure Granulation Quality: Pink, Pale, Hyper-granulation Fascia Exposed: No Necrotic Amount: Small (1-33%) Fat Layer (Subcutaneous Tissue) Exposed: Yes Lutterman, Meshach ([id number]) Necrotic Quality: Adherent Slough Tendon Exposed: No Muscle Exposed: No Joint Exposed: No Bone Exposed: No Periwound Skin Texture Texture Color No Abnormalities Noted: No No  Abnormalities Noted: No Callus: No Atrophie Blanche: No Crepitus: No Cyanosis: No Excoriation: No Ecchymosis: No Induration: No Erythema: No Rash: No Hemosiderin Staining: Yes Scarring: Yes Mottled: No Pallor: No Moisture Rubor: No No Abnormalities Noted: No Dry / Scaly: No Temperature / Pain Maceration: No Temperature: No Abnormality Tenderness on Palpation: Yes Wound Preparation Ulcer Cleansing: Rinsed/Irrigated with Saline, Other: surg scrub and water, Topical Anesthetic Applied: Other: lidocaine 4%, Treatment Notes Wound #4 (Right, Medial Mark Leg) 1. Cleansed with: Clean wound with Normal Saline Cleanse wound with  antibacterial soap and water 2. Anesthetic Topical Lidocaine 4% cream to wound bed prior to debridement 3. Peri-wound Care: Barrier cream 4. Dressing Applied: Hydrafera Blue 5. Secondary Dressing Applied ABD Pad 7. Secured with 4-Layer Compression System - Right Mark Extremity Notes unna to Engineer, production) Signed: 11/09/2016 4:42:12 PM By: Gretta Cool, [name], RN, CWS, [name], [name] Entered By: Gretta Cool, [name], RN, CWS, Kim on 11/09/2016 08:20:52 Diven, [name] ([id number]) Bucio, [name] ([id number]) -------------------------------------------------------------------------------- Vitals [name] Sallie, Date of Service: 11/09/2016 8:15 AM Patient Name: [name] Patient Account Number: [id number] Medical Record Treating RN: Cornell Barman [id number] Number: Other Clinician: Date of Birth/Sex: 03-27-1951 (66 y.o. Male) Treating [name], MICHAEL Primary Care Marrion Accomando: Royetta Crochet Danette Weinfeld/Extender: G Referring Meline Russaw: Doristine Locks in Treatment: 20 Vital Signs Time Taken: 09:06 Temperature (F): 97.6 Height (in): 74 Pulse (bpm): 51 Weight (lbs): 252 Respiratory Rate (breaths/min): 18 Body Mass Index (BMI): 32.4 Blood Pressure (mmHg): 117/75 Reference Range: 80 - 120 mg / dl Electronic Signature(s) Signed: 11/09/2016  9:07:33 AM By: Gretta Cool, [name], RN, CWS, [name], [name] Entered By: Gretta Cool, [name], RN, CWS, Kim on 11/09/2016 09:07:33
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
## 2016-11-12 DIAGNOSIS — I87331 Chronic venous hypertension (idiopathic) with ulcer and inflammation of right lower extremity: Secondary | ICD-10-CM | POA: Diagnosis not present

## 2016-11-14 NOTE — Progress Notes (Signed)
AERIK, POLAN ([id number]) Visit Report for 11/12/2016 Arrival Information Details Patient Name: [name], [name] Date of Service: 11/12/2016 8:15 AM Medical Record Number: [id number] Patient Account Number: [id number] Date of Birth/Sex: July 23, 1950 (66 y.o. Male) Treating RN: Baruch Gouty, RN, [name], Velva Harman Primary Care Jonique Kulig: Royetta Crochet Other Clinician: Referring Roda Lauture: Royetta Crochet Treating Deserae Jennings/Extender: Frann Rider in Treatment: 21 Visit Information History Since Last Visit All ordered tests and consults were completed: No Patient Arrived: Ambulatory Added or deleted any medications: No Arrival Time: 08:09 Any new allergies or adverse reactions: No Accompanied By: self Had a fall or experienced change in No Transfer Assistance: None activities of daily living that may affect Patient Identification Verified: Yes risk of falls: Secondary Verification Process Yes Signs or symptoms of abuse/neglect since last No Completed: visito Patient Requires Transmission- No Hospitalized since last visit: No Based Precautions: Has Dressing in Place as Prescribed: Yes Patient Has Alerts: Yes Has Compression in Place as Prescribed: Yes Patient Alerts: Patient on Blood Pain Present Now: Yes Thinner warfarin Electronic Signature(s) Signed: 11/12/2016 11:12:14 AM By: [name] [name], RN Entered By: [name] on 11/12/2016 08:09:53 [name], [name] ([id number]) -------------------------------------------------------------------------------- Encounter Discharge Information Details Patient Name: [name] Date of Service: 11/12/2016 8:15 AM Medical Record Number: [id number] Patient Account Number: [id number] Date of Birth/Sex: 11-Dec-1950 (66 y.o. Male) Treating RN: Baruch Gouty, RN, [name], Velva Harman Primary Care Sophina Mitten: Royetta Crochet Other Clinician: Referring Kelsi Benham: Royetta Crochet Treating Enedelia Martorelli/Extender: Frann Rider in Treatment: 21 Encounter Discharge  Information Items Discharge Pain Level: 0 Discharge Condition: Stable Ambulatory Status: Ambulatory Discharge Destination: Home Private Transportation: Auto Accompanied By: self Schedule Follow-up Appointment: No Medication Reconciliation completed and No provided to Patient/Care Helena Sardo: Clinical Summary of Care: Electronic Signature(s) Signed: 11/12/2016 11:12:14 AM By: [name] [name], RN Entered By: [name] on 11/12/2016 08:27:52 [name], [name] ([id number]) -------------------------------------------------------------------------------- Patient/Caregiver Education Details [name], Date of Service: 11/12/2016 8:15 AM Patient Name: [facility] Patient Account Number: [id number] Medical Record Afful, RN, [name], [id number] Treating RN: Number: Velva Harman Date of Birth/Gender: 26-Apr-1951 (66 y.o. Male) Other Clinician: Primary Care Physician: Joycelyn Das Referring Physician: Royetta Crochet Physician/Extender: Suella Grove in Treatment: 21 Education Assessment Education Provided To: Patient Education Topics Provided Welcome To [facility]: Methods: Explain/Verbal Electronic Signature(s) Signed: 11/12/2016 11:12:14 AM By: [name] [name], RN Entered By: [name] on 11/12/2016 08:27:40

## 2016-11-16 ENCOUNTER — Encounter: Payer: Medicare Other | Attending: Internal Medicine | Admitting: Internal Medicine

## 2016-11-16 DIAGNOSIS — I87331 Chronic venous hypertension (idiopathic) with ulcer and inflammation of right lower extremity: Secondary | ICD-10-CM | POA: Diagnosis present

## 2016-11-16 DIAGNOSIS — F17210 Nicotine dependence, cigarettes, uncomplicated: Secondary | ICD-10-CM | POA: Diagnosis not present

## 2016-11-16 DIAGNOSIS — L97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: Secondary | ICD-10-CM | POA: Insufficient documentation

## 2016-11-16 DIAGNOSIS — Z8673 Personal history of transient ischemic attack (TIA), and cerebral infarction without residual deficits: Secondary | ICD-10-CM | POA: Diagnosis not present

## 2016-11-16 DIAGNOSIS — Z7901 Long term (current) use of anticoagulants: Secondary | ICD-10-CM | POA: Diagnosis not present

## 2016-11-16 DIAGNOSIS — Z86718 Personal history of other venous thrombosis and embolism: Secondary | ICD-10-CM | POA: Insufficient documentation

## 2016-11-16 DIAGNOSIS — I4891 Unspecified atrial fibrillation: Secondary | ICD-10-CM | POA: Insufficient documentation

## 2016-11-16 DIAGNOSIS — I890 Lymphedema, not elsewhere classified: Secondary | ICD-10-CM | POA: Insufficient documentation

## 2016-11-16 DIAGNOSIS — M109 Gout, unspecified: Secondary | ICD-10-CM | POA: Insufficient documentation

## 2016-11-16 DIAGNOSIS — I10 Essential (primary) hypertension: Secondary | ICD-10-CM | POA: Diagnosis not present
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
## 2016-11-18 NOTE — Progress Notes (Addendum)
[name], [name] ([id number]) Visit Report for 11/16/2016 Arrival Information Details Bonilla, Date of Service: 11/16/2016 8:15 AM Patient Name: [name] Patient Account Number: [id number] Medical Record Treating RN: Baruch Gouty, RN, [name], Velva Harman [id number] Number: Other Clinician: Date of Birth/Sex: 1950-08-23 (66 y.o. Male) Treating [name] Primary Care Cinzia Devos: Royetta Crochet Cochise Dinneen/Extender: G Referring Ski Polich: Doristine Locks in Treatment: 21 Visit Information History Since Last Visit All ordered tests and consults were completed: No Patient Arrived: Ambulatory Added or deleted any medications: No Arrival Time: 08:12 Any new allergies or adverse reactions: No Accompanied By: self Had a fall or experienced change in No Transfer Assistance: None activities of daily living that may affect Patient Identification Verified: Yes risk of falls: Secondary Verification Process Yes Signs or symptoms of abuse/neglect since last No Completed: visito Patient Requires Transmission- No Hospitalized since last visit: No Based Precautions: Has Dressing in Place as Prescribed: Yes Patient Has Alerts: Yes Has Compression in Place as Prescribed: Yes Patient Alerts: Patient on Blood Pain Present Now: Yes Thinner warfarin Electronic Signature(s) Signed: 11/16/2016 4:25:10 PM By: [name] [name], RN Entered By: [name] on 11/16/2016 08:17:31 [name], [name] ([id number]) -------------------------------------------------------------------------------- Encounter Discharge Information Details Boger, Date of Service: 11/16/2016 8:15 AM Patient Name: [name] Patient Account Number: [id number] Medical Record Treating RN: Baruch Gouty RN, [name], Velva Harman [id number] Number: Other Clinician: Date of Birth/Sex: March 08, 1951 (66 y.o. Male) Treating [name] Primary Care Koleton Duchemin: Royetta Crochet Ciella Obi/Extender: G Referring Avanish Cerullo: Doristine Locks in Treatment: 21 Encounter  Discharge Information Items Discharge Pain Level: 0 Discharge Condition: Stable Ambulatory Status: Ambulatory Discharge Destination: Home Transportation: Private Auto Accompanied By: self Schedule Follow-up Appointment: No Medication Reconciliation completed No and provided to Patient/Care [name]: Patient Clinical Summary of Care: Declined Electronic Signature(s) Signed: 11/16/2016 9:08:02 AM By: Ruthine Dose Entered By: Ruthine Dose on 11/16/2016 09:08:02 [name], [name] ([id number]) -------------------------------------------------------------------------------- Lower Extremity Assessment Details Hermida, Date of Service: 11/16/2016 8:15 AM Patient Name: [name] Patient Account Number: [id number] Medical Record Treating RN: Baruch Gouty, RN, [name], Velva Harman [id number] Number: Other Clinician: Date of Birth/Sex: [birth year] (66 y.o. Male) Treating [name] Primary Care Katie Moch: Royetta Crochet Kingsley Herandez/Extender: G Referring Erian Rosengren: Doristine Locks in Treatment: 21 Vascular Assessment Claudication: Claudication Assessment [Right:None] Pulses: Dorsalis Pedis Palpable: [Right:Yes] Posterior Tibial Extremity colors, hair growth, and conditions: Extremity Color: [Right:Hyperpigmented] Hair Growth on Extremity: [Right:Yes] Temperature of Extremity: [Right:Warm] Capillary Refill: [Right:< 3 seconds] Toe Nail Assessment Left: Right: Thick: Yes Discolored: Yes Deformed: Yes Improper Length and Hygiene: Yes Electronic Signature(s) Signed: 11/16/2016 8:11:04 AM By: [name] [name], RN Entered By: [name] on 11/16/2016 08:11:04 [name], [name] ([id number]) -------------------------------------------------------------------------------- Multi Wound Chart Details Neiswender, Date of Service: 11/16/2016 8:15 AM Patient Name: [name] Patient Account Number: [id number] Medical Record Treating RN: Baruch Gouty RN, [name], Velva Harman [id number] Number: Other Clinician: Date of  Birth/Sex: [birth year] (65 y.o. Male) Treating [name] Primary Care Passion Lavin: Royetta Crochet Josepha Barbier/Extender: G Referring Keshun Berrett: Royetta Crochet Weeks in Treatment: 21 Vital Signs Height(in): 74 Pulse(bpm): 69 Weight(lbs): 252 Blood Pressure 162/91 (mmHg): Body Mass Index(BMI): 32 Temperature(F): 97.4 Respiratory Rate 17 (breaths/min): Photos: [4:No Photos] [N/A:N/A] Wound Location: [4:Right Lower Leg - Medial N/A] Wounding Event: [4:Gradually Appeared] [N/A:N/A] Primary Etiology: [4:Venous Leg Ulcer] [N/A:N/A] Comorbid History: [4:Arrhythmia, Hypertension, N/A Gout, Neuropathy] Date Acquired: [4:06/02/2016] [N/A:N/A] Weeks of Treatment: [4:21] [N/A:N/A] Wound Status: [4:Open] [N/A:N/A] Measurements L x W x D 2.2x2.4x0.3 [N/A:N/A] (cm) Area (cm) : [4:4.147] [N/A:N/A] Volume (cm) : [4:1.244] [N/A:N/A] % Reduction in Area: [4:-60.00%] [N/A:N/A] % Reduction in Volume: -380.30% [N/A:N/A] Classification: [4:Full Thickness  Without Exposed Support Structures] [N/A:N/A] Exudate Amount: [4:Large] [N/A:N/A] Exudate Type: [4:Serosanguineous] [N/A:N/A] Exudate Color: [4:red, brown] [N/A:N/A] Wound Margin: [4:Distinct, outline attached N/A] Granulation Amount: [4:Medium (34-66%)] [N/A:N/A] Granulation Quality: [4:Pink, Pale, Hyper- granulation] [N/A:N/A] Necrotic Amount: [4:Small (1-33%)] [N/A:N/A] Exposed Structures: [4:Fat Layer (Subcutaneous N/A Tissue) Exposed: Yes] Fascia: No Tendon: No Muscle: No Joint: No Bone: No Epithelialization: Small (1-33%) N/A N/A Periwound Skin Texture: Scarring: Yes N/A N/A Excoriation: No Induration: No Callus: No Crepitus: No Rash: No Periwound Skin Maceration: No N/A N/A Moisture: Dry/Scaly: No Periwound Skin Color: Hemosiderin Staining: Yes N/A N/A Atrophie Blanche: No Cyanosis: No Ecchymosis: No Erythema: No Mottled: No Pallor: No Rubor: No Temperature: No Abnormality N/A N/A Tenderness on Yes N/A  N/A Palpation: Wound Preparation: Ulcer Cleansing: N/A N/A Rinsed/Irrigated with Saline, Other: surg scrub and water Topical Anesthetic Applied: Other: lidocaine 4% Treatment Notes Wound #4 (Right, Medial Lower Leg) 1. Cleansed with: Cleanse wound with antibacterial soap and water 3. Peri-wound Care: Barrier cream Moisturizing lotion 4. Dressing Applied: Hydrafera Blue 5. Secondary Dressing Applied ABD Pad 7. Secured with 4-Layer Compression System - Right Lower Extremity Notes unna to anchor [name], [name] ([id number]) Electronic Signature(s) Signed: 11/16/2016 5:35:48 PM By: [name] MD Entered By: [name] on 11/16/2016 09:19:30 [name], [name] ([id number]) -------------------------------------------------------------------------------- Multi-Disciplinary Care Plan Details Berkley, Date of Service: 11/16/2016 8:15 AM Patient Name: [name] Patient Account Number: [id number] Medical Record Treating RN: Baruch Gouty RN, [name], Velva Harman [id number] Number: Other Clinician: Date of Birth/Sex: [birth year] (66 y.o. Male) Treating [name], Estelline Primary Care Ronetta Molla: Royetta Crochet Natalea Sutliff/Extender: G Referring Jeriann Sayres: Doristine Locks in Treatment: 21 Active Inactive ` Orientation to the Wound Care Program Nursing Diagnoses: Knowledge deficit related to the wound healing [name] program Goals: Patient/caregiver will verbalize understanding of the [facility] Program Date Initiated: 06/16/2016 Target Resolution Date: 08/15/2016 Goal Status: Active Interventions: Provide education on orientation to the wound [name] Notes: ` Venous Leg Ulcer Nursing Diagnoses: Potential for venous Insuffiency (use before diagnosis confirmed) Goals: Patient will maintain optimal edema control Date Initiated: 06/16/2016 Target Resolution Date: 08/19/2016 Goal Status: Active Interventions: Compression as ordered Notes: ` Wound/Skin Impairment Nursing  Diagnoses: [name], [name] ([id number]) Impaired tissue integrity Goals: Patient/caregiver will verbalize understanding of skin care regimen Date Initiated: 06/16/2016 Target Resolution Date: 07/15/2016 Goal Status: Active Ulcer/skin breakdown will have a volume reduction of 30% by week 4 Date Initiated: 06/16/2016 Target Resolution Date: 07/15/2016 Goal Status: Active Ulcer/skin breakdown will have a volume reduction of 50% by week 8 Date Initiated: 06/16/2016 Target Resolution Date: 07/15/2016 Goal Status: Active Ulcer/skin breakdown will have a volume reduction of 80% by week 12 Date Initiated: 06/16/2016 Target Resolution Date: 07/15/2016 Goal Status: Active Ulcer/skin breakdown will heal within 14 weeks Date Initiated: 06/16/2016 Target Resolution Date: 07/15/2016 Goal Status: Active Interventions: Assess patient/caregiver ability to obtain necessary supplies Assess patient/caregiver ability to perform ulcer/skin care regimen upon admission and as needed Assess ulceration(s) every visit Notes: Electronic Signature(s) Signed: 11/16/2016 4:25:10 PM By: [name] [name], RN Entered By: [name] on 11/16/2016 08:48:00 Velarde, [name] ([id number]) -------------------------------------------------------------------------------- Pain Assessment Details Mahler, Date of Service: 11/16/2016 8:15 AM Patient Name: [name] Patient Account Number: [id number] Medical Record Treating RN: Baruch Gouty RN, [name], Velva Harman [id number] Number: Other Clinician: Date of Birth/Sex: 11-Sep-1950 (66 y.o. Male) Treating [name] Primary Care Uri Turnbough: Royetta Crochet Pattiann Solanki/Extender: G Referring Press Casale: Doristine Locks in Treatment: 21 Active Problems Location of Pain Severity and Description of Pain Patient Has Paino Yes Site Locations Pain Location: Pain in Ulcers With  Dressing Change: Yes Duration of the Pain. Constant / Intermittento Constant Rate the pain. Current Pain Level:  4 Character of Pain Describe the Pain: Tender Pain Management and Medication Current Pain Management: Electronic Signature(s) Signed: 11/16/2016 4:25:10 PM By: [name] [name], RN Entered By: [name] on 11/16/2016 08:18:01 Ciani, [name] ([id number]) -------------------------------------------------------------------------------- Patient/Caregiver Education Details Barron, Date of Service: 11/16/2016 8:15 AM Patient Name: [name] Patient Account Number: [id number] Medical Record Treating RN: Baruch Gouty, RN, [name], Velva Harman [id number] Number: Other Clinician: Date of Birth/Gender: 03-05-1951 (66 y.o. Male) Treating [name], MICHAEL Primary Care Physician/Extender: Tonye Pearson Physician: Suella Grove in Treatment: 21 Referring Physician: Royetta Crochet Education Assessment Education Provided To: Patient Education Topics Provided Welcome To [facility]: Methods: Explain/Verbal Responses: State content correctly Wound/Skin Impairment: Methods: Explain/Verbal Responses: State content correctly Electronic Signature(s) Signed: 11/16/2016 4:25:10 PM By: [name] [name], RN Entered By: [name] on 11/16/2016 08:58:51 [place], [name] ([id number]) -------------------------------------------------------------------------------- Wound Assessment Details Heindl, Date of Service: 11/16/2016 8:15 AM Patient Name: [name] Patient Account Number: [id number] Medical Record Treating RN: Baruch Gouty, RN, [name], Velva Harman [id number] Number: Other Clinician: Date of Birth/Sex: [birth year] (66 y.o. Male) Treating [name], MICHAEL Primary Care Albertia Carvin: Royetta Crochet Joandy [name]/Extender: G Referring Kamoria Lucien: Doristine Locks in Treatment: 21 Wound Status Wound Number: 4 Primary Venous Leg Ulcer Etiology: Wound Location: Right Lower Leg - Medial Wound Status: Open Wounding Event: Gradually Appeared Comorbid Arrhythmia, Hypertension, Gout, Date Acquired: 06/02/2016 History: Neuropathy Weeks  Of Treatment: 21 Clustered Wound: No Photos Photo Uploaded By: [name] on 11/16/2016 14:25:37 Wound Measurements Length: (cm) 2.2 Width: (cm) 2.4 Depth: (cm) 0.3 Area: (cm) 4.147 Volume: (cm) 1.244 % Reduction in Area: -60% % Reduction in Volume: -380.3% Epithelialization: Small (1-33%) Tunneling: No Undermining: No Wound Description Full Thickness Without Exposed Classification: Support Structures Wound Margin: Distinct, outline attached [name], Mark ([id number]) Foul Odor After Cleansing: No Slough/Fibrino Yes Exudate Large Amount: Exudate Type: Serosanguineous Exudate Color: red, brown Wound Bed Granulation Amount: Medium (34-66%) Exposed Structure Granulation Quality: Pink, Pale, Hyper-granulation Fascia Exposed: No Necrotic Amount: Small (1-33%) Fat Layer (Subcutaneous Tissue) Exposed: Yes Necrotic Quality: Adherent Slough Tendon Exposed: No Muscle Exposed: No Joint Exposed: No Bone Exposed: No Periwound Skin Texture Texture Color No Abnormalities Noted: No No Abnormalities Noted: No Callus: No Atrophie Blanche: No Crepitus: No Cyanosis: No Excoriation: No Ecchymosis: No Induration: No Erythema: No Rash: No Hemosiderin Staining: Yes Scarring: Yes Mottled: No Pallor: No Moisture Rubor: No No Abnormalities Noted: No Dry / Scaly: No Temperature / Pain Maceration: No Temperature: No Abnormality Tenderness on Palpation: Yes Wound Preparation Ulcer Cleansing: Rinsed/Irrigated with Saline, Other: surg scrub and water, Topical Anesthetic Applied: Other: lidocaine 4%, Treatment Notes Wound #4 (Right, Medial Lower Leg) 1. Cleansed with: Cleanse wound with antibacterial soap and water 3. Peri-wound Care: Barrier cream Moisturizing lotion 4. Dressing Applied: Hydrafera Blue 5. Secondary Dressing Applied ABD Pad 7. Secured with 4-Layer Compression System - Right Lower Extremity [name], [name] ([id number]) Notes unna to  anchor Electronic Signature(s) Signed: 11/16/2016 4:25:10 PM By: [name] [name], RN Entered By: [name] on 11/16/2016 08:31:01 Kuennen, [name] ([id number]) -------------------------------------------------------------------------------- Vitals Details Risden, Date of Service: 11/16/2016 8:15 AM Patient Name: [name] Patient Account Number: [id number] Medical Record Treating RN: Baruch Gouty RN, [name], Velva Harman [id number] Number: Other Clinician: Date of Birth/Sex: [birth year] (65 y.o. Male) Treating [name], MICHAEL Primary Care Estel Scholze: Royetta Crochet Gurtaj Ruz/Extender: G Referring Ashtin Melichar: Doristine Locks in Treatment: 21 Vital Signs Time Taken: 08:18 Temperature (F): 97.4 Height (in): 74 Pulse (bpm): 69 Weight (  lbs): 252 Respiratory Rate (breaths/min): 17 Body Mass Index (BMI): 32.4 Blood Pressure (mmHg): 162/91 Reference Range: 80 - 120 mg / dl Electronic Signature(s) Signed: 11/16/2016 4:25:10 PM By: [name] [name], RN Entered By: [name] on 11/16/2016 08:18:30

## 2016-11-19 DIAGNOSIS — I87331 Chronic venous hypertension (idiopathic) with ulcer and inflammation of right lower extremity: Secondary | ICD-10-CM | POA: Diagnosis not present

## 2016-11-22 NOTE — Progress Notes (Signed)
Mark, [name] ([id number]) Visit Report for 11/19/2016 Arrival Information Details Patient Name: [name], [name] Date of Service: 11/19/2016 8:00 AM Medical Record Number: [id number] Patient Account Number: [id number] Date of Birth/Sex: Jun 12, 1950 (66 y.o. Male) Treating RN: Baruch Gouty, RN, [name], Velva Harman Primary Care Lakin Rhine: Royetta Crochet Other Clinician: Referring Bernyce Brimley: Royetta Crochet Treating Daryan Cagley/Extender: Melburn Hake, HOYT Weeks in Treatment: 22 Visit Information History Since Last Visit All ordered tests and consults were completed: No Patient Arrived: Ambulatory Added or deleted any medications: No Arrival Time: 08:14 Any new allergies or adverse reactions: No Accompanied By: self Had a fall or experienced change in No Transfer Assistance: None activities of daily living that may affect Patient Identification Verified: Yes risk of falls: Secondary Verification Process Yes Signs or symptoms of abuse/neglect since last No Completed: visito Patient Requires Transmission- No Hospitalized since last visit: No Based Precautions: Has Dressing in Place as Prescribed: Yes Patient Has Alerts: Yes Has Compression in Place as Prescribed: Yes Patient Alerts: Patient on Blood Pain Present Now: No Thinner warfarin Electronic Signature(s) Signed: 11/19/2016 1:51:46 PM By: [name] [name], RN Entered By: [name] on 11/19/2016 08:25:51 [place], [name] ([id number]) -------------------------------------------------------------------------------- Encounter Discharge Information Details Patient Name: [name] Date of Service: 11/19/2016 8:00 AM Medical Record Number: [id number] Patient Account Number: [id number] Date of Birth/Sex: 1950/12/09 (66 y.o. Male) Treating RN: Baruch Gouty, RN, [name], Velva Harman Primary Care Rudie Rikard: Royetta Crochet Other Clinician: Referring Littie Chiem: Royetta Crochet Treating Zia Kanner/Extender: STONE III, HOYT Weeks in Treatment: 22 Encounter Discharge  Information Items Discharge Pain Level: 0 Discharge Condition: Stable Ambulatory Status: Ambulatory Discharge Destination: Home Private Transportation: Auto Accompanied By: self Schedule Follow-up Appointment: No Medication Reconciliation completed and No provided to Patient/Care Suella Cogar: Clinical Summary of Care: Electronic Signature(s) Signed: 11/19/2016 1:51:46 PM By: [name] [name], RN Entered By: [name] on 11/19/2016 08:26:53 Pryor, [name] ([id number]) -------------------------------------------------------------------------------- Patient/Caregiver Education Details [place], Date of Service: 11/19/2016 8:00 AM Patient Name: [name] Patient Account Number: [id number] Medical Record Afful, RN, [name], [id number] Treating RN: Number: Velva Harman Date of Birth/Gender: June 18, 1950 (66 y.o. Male) Other Clinician: Primary Care Physician: Royetta Crochet Treating STONE III, [place] Referring Physician: Royetta Crochet Physician/Extender: Weeks in Treatment: 22 Education Assessment Education Provided To: Patient Education Topics Provided Welcome To [facility]: Methods: Explain/Verbal Responses: State content correctly Electronic Signature(s) Signed: 11/19/2016 1:51:46 PM By: [name] [name], RN Entered By: [name] on 11/19/2016 08:26:39

## 2016-11-23 ENCOUNTER — Encounter: Payer: Medicare Other | Admitting: Physician Assistant

## 2016-11-23 DIAGNOSIS — I87331 Chronic venous hypertension (idiopathic) with ulcer and inflammation of right lower extremity: Secondary | ICD-10-CM | POA: Diagnosis not present
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
## 2016-11-24 NOTE — Progress Notes (Signed)
[name] ([id number]) Visit Report for 11/23/2016 Arrival Information Details Patient Name: [name], [name] Date of Service: 11/23/2016 8:15 AM Medical Record Number: [id number] Patient Account Number: [id number] Date of Birth/Sex: Mar 03, 1951 (66 y.o. Male) Treating RN: Baruch Gouty, RN, [name], Velva Harman Primary Care Uri Turnbough: Royetta Crochet Other Clinician: Referring Paulita Licklider: Royetta Crochet Treating Giulian Goldring/Extender: Melburn Hake, HOYT Weeks in Treatment: 22 Visit Information History Since Last Visit All ordered tests and consults were completed: No Patient Arrived: Ambulatory Added or deleted any medications: No Arrival Time: 08:11 Any new allergies or adverse reactions: No Accompanied By: self Had a fall or experienced change in No Transfer Assistance: None activities of daily living that may affect Patient Identification Verified: Yes risk of falls: Secondary Verification Process Yes Signs or symptoms of abuse/neglect since last No Completed: visito Patient Requires Transmission- No Hospitalized since last visit: No Based Precautions: Has Dressing in Place as Prescribed: Yes Patient Has Alerts: Yes Has Compression in Place as Prescribed: Yes Patient Alerts: Patient on Blood Pain Present Now: Yes Thinner warfarin Electronic Signature(s) Signed: 11/23/2016 2:32:03 PM By: [name] [name], RN Entered By: [name] on 11/23/2016 08:11:24 Doepke, [name] ([id number]) -------------------------------------------------------------------------------- Encounter Discharge Information Details Patient Name: [name] Date of Service: 11/23/2016 8:15 AM Medical Record Number: [id number] Patient Account Number: [id number] Date of Birth/Sex: [birth year] (66 y.o. Male) Treating RN: Baruch Gouty, RN, [name], Velva Harman Primary Care Cathlyn Tersigni: Royetta Crochet Other Clinician: Referring Deeanna Beightol: Royetta Crochet Treating Sheetal Lyall/Extender: STONE III, HOYT Weeks in Treatment: 22 Encounter  Discharge Information Items Discharge Pain Level: 0 Discharge Condition: Stable Ambulatory Status: Ambulatory Discharge Destination: Home Transportation: Private Auto Accompanied By: self Schedule Follow-up Appointment: No Medication Reconciliation completed No and provided to Patient/Care Wana Mount: Patient Clinical Summary of Care: Declined Electronic Signature(s) Signed: 11/23/2016 2:32:03 PM By: [name] [name], RN Previous Signature: 11/23/2016 8:48:40 AM Version By: Ruthine Dose Entered By: [name] on 11/23/2016 08:50:14 Fehr, [name] ([id number]) -------------------------------------------------------------------------------- Lower Extremity Assessment Details Patient Name: [name] Date of Service: 11/23/2016 8:15 AM Medical Record Number: [id number] Patient Account Number: [id number] Date of Birth/Sex: [birth year] (66 y.o. Male) Treating RN: Baruch Gouty, RN, [name], Velva Harman Primary Care Latonga Ponder: Royetta Crochet Other Clinician: Referring Leonna Schlee: Royetta Crochet Treating Audianna Landgren/Extender: STONE III, HOYT Weeks in Treatment: 22 Vascular Assessment Claudication: Claudication Assessment [Right:None] Pulses: Dorsalis Pedis Palpable: [Right:Yes] Posterior Tibial Extremity colors, hair growth, and conditions: Extremity Color: [Right:Hyperpigmented] Hair Growth on Extremity: [Right:Yes] Temperature of Extremity: [Right:Warm] Capillary Refill: [Right:< 3 seconds] Toe Nail Assessment Left: Right: Thick: Yes Discolored: Yes Deformed: Yes Improper Length and Hygiene: Yes Electronic Signature(s) Signed: 11/23/2016 8:32:18 AM By: [name] [name], RN Entered By: [name] on 11/23/2016 08:32:18 Guerrette, [name] ([id number]) -------------------------------------------------------------------------------- Multi Wound Chart Details Patient Name: [name] Date of Service: 11/23/2016 8:15 AM Medical Record Number: [id number] Patient Account Number:  [id number] Date of Birth/Sex: 17-May-1951 (66 y.o. Male) Treating RN: Baruch Gouty, RN, [name], Velva Harman Primary Care Quinlyn Tep: Royetta Crochet Other Clinician: Referring Toran Murch: Royetta Crochet Treating Majestic Brister/Extender: STONE III, HOYT Weeks in Treatment: 22 Vital Signs Height(in): 74 Pulse(bpm): 53 Weight(lbs): 252 Blood Pressure 145/80 (mmHg): Body Mass Index(BMI): 32 Temperature(F): 97.4 Respiratory Rate 17 (breaths/min): Photos: [4:No Photos] [N/A:N/A] Wound Location: [4:Right, Medial Lower Leg] [N/A:N/A] Wounding Event: [4:Gradually Appeared] [N/A:N/A] Primary Etiology: [4:Venous Leg Ulcer] [N/A:N/A] Date Acquired: [4:06/02/2016] [N/A:N/A] Weeks of Treatment: [4:22] [N/A:N/A] Wound Status: [4:Open] [N/A:N/A] Measurements L x W x D 2x2x0.2 [N/A:N/A] (cm) Area (cm) : [4:3.142] [N/A:N/A] Volume (cm) : [4:0.628] [N/A:N/A] % Reduction in Area: [4:-21.20%] [N/A:N/A] % Reduction in Volume: -142.50% [N/A:N/A]  Classification: [4:Full Thickness Without Exposed Support Structures] [N/A:N/A] Periwound Skin Texture: No Abnormalities Noted [N/A:N/A] Periwound Skin [4:No Abnormalities Noted] [N/A:N/A] Moisture: Periwound Skin Color: No Abnormalities Noted [N/A:N/A] Tenderness on [4:No] [N/A:N/A] Treatment Notes Electronic Signature(s) Signed: 11/23/2016 8:32:32 AM By: [name] [name], RN Entered By: [name] on 11/23/2016 08:32:32 Regis, [name] ([id number]) Dubiel, [name] ([id number]) -------------------------------------------------------------------------------- Multi-Disciplinary Care Plan Details Patient Name: [name] Date of Service: 11/23/2016 8:15 AM Medical Record Number: [id number] Patient Account Number: [id number] Date of Birth/Sex: 1950/09/24 (66 y.o. Male) Treating RN: Baruch Gouty, RN, [name], Velva Harman Primary Care Malayna Noori: Royetta Crochet Other Clinician: Referring Porfiria Heinrich: Royetta Crochet Treating Densel Kronick/Extender: STONE III, HOYT Weeks in Treatment:  22 Active Inactive ` Orientation to the Wound Care Program Nursing Diagnoses: Knowledge deficit related to the wound healing center program Goals: Patient/caregiver will verbalize understanding of the [place] Program Date Initiated: 06/16/2016 Target Resolution Date: 08/15/2016 Goal Status: Active Interventions: Provide education on orientation to the wound center Notes: ` Venous Leg Ulcer Nursing Diagnoses: Potential for venous Insuffiency (use before diagnosis confirmed) Goals: Patient will maintain optimal edema control Date Initiated: 06/16/2016 Target Resolution Date: 08/19/2016 Goal Status: Active Interventions: Compression as ordered Notes: ` Wound/Skin Impairment Nursing Diagnoses: Impaired tissue integrity Rembold, [name] ([id number]) Goals: Patient/caregiver will verbalize understanding of skin care regimen Date Initiated: 06/16/2016 Target Resolution Date: 07/15/2016 Goal Status: Active Ulcer/skin breakdown will have a volume reduction of 30% by week 4 Date Initiated: 06/16/2016 Target Resolution Date: 07/15/2016 Goal Status: Active Ulcer/skin breakdown will have a volume reduction of 50% by week 8 Date Initiated: 06/16/2016 Target Resolution Date: 07/15/2016 Goal Status: Active Ulcer/skin breakdown will have a volume reduction of 80% by week 12 Date Initiated: 06/16/2016 Target Resolution Date: 07/15/2016 Goal Status: Active Ulcer/skin breakdown will heal within 14 weeks Date Initiated: 06/16/2016 Target Resolution Date: 07/15/2016 Goal Status: Active Interventions: Assess patient/caregiver ability to obtain necessary supplies Assess patient/caregiver ability to perform ulcer/skin care regimen upon admission and as needed Assess ulceration(s) every visit Notes: Electronic Signature(s) Signed: 11/23/2016 8:32:24 AM By: [name] [name], RN Entered By: [name] on 11/23/2016 08:32:23 Viera, [name]  ([id number]) -------------------------------------------------------------------------------- Pain Assessment Details Patient Name: [name] Date of Service: 11/23/2016 8:15 AM Medical Record Number: [id number] Patient Account Number: [id number] Date of Birth/Sex: 1950/08/21 (66 y.o. Male) Treating RN: Baruch Gouty, RN, [name], Velva Harman Primary Care Kumiko Fishman: Royetta Crochet Other Clinician: Referring Ramello Cordial: Royetta Crochet Treating Wade Asebedo/Extender: STONE III, HOYT Weeks in Treatment: 22 Active Problems Location of Pain Severity and Description of Pain Patient Has Paino Yes Site Locations Pain Location: Pain in Ulcers Rate the pain. Current Pain Level: 5 Worst Pain Level: 6 Character of Pain Describe the Pain: Burning, Tender Pain Management and Medication Current Pain Management: Rest: Yes How does your wound impact your activities of daily livingo Sleep: Yes Bathing: Yes Appetite: Yes Relationship With Others: Yes Bladder Continence: Yes Emotions: Yes Bowel Continence: Yes Work: Yes Toileting: Yes Drive: Yes Dressing: Yes Hobbies: Yes Engineer, maintenance) Signed: 11/23/2016 2:32:03 PM By: [name] [name], RN Entered By: [name] on 11/23/2016 08:13:22 Orihuela, [name] ([id number]) -------------------------------------------------------------------------------- Patient/Caregiver Education Details Patient Name: [name] Date of Service: 11/23/2016 8:15 AM Medical Record Number: [id number] Patient Account Number: [id number] Date of Birth/Gender: Jul 14, 1950 (66 y.o. Male) Treating RN: Baruch Gouty, RN, [name], Velva Harman Primary Care Physician: Royetta Crochet Other Clinician: Referring Physician: Royetta Crochet Treating Physician/Extender: Melburn Hake, HOYT Weeks in Treatment: 22 Education Assessment Education Provided To: Patient Education Topics Provided Welcome To [facility]: Methods: Explain/Verbal Responses:  Reinforcements needed Wound/Skin  Impairment: Methods: Explain/Verbal Responses: Reinforcements needed Electronic Signature(s) Signed: 11/23/2016 2:32:03 PM By: [name] [name], RN Entered By: [name] on 11/23/2016 08:50:31 Bessler, [name] ([id number]) -------------------------------------------------------------------------------- Wound Assessment Details Patient Name: [name] Date of Service: 11/23/2016 8:15 AM Medical Record Number: [id number] Patient Account Number: [id number] Date of Birth/Sex: 05/01/1951 (66 y.o. Male) Treating RN: Afful, RN, [name], Kaka Primary Care Nikie Cid: Royetta Crochet Other Clinician: Referring Skarlette Lattner: Royetta Crochet Treating Virgil Slinger/Extender: STONE III, HOYT Weeks in Treatment: 22 Wound Status Wound Number: 4 Primary Etiology: Venous Leg Ulcer Wound Location: Right, Medial Lower Leg Wound Status: Open Wounding Event: Gradually Appeared Date Acquired: 06/02/2016 Weeks Of Treatment: 22 Clustered Wound: No Photos Photo Uploaded By: [name] on 11/23/2016 16:38:42 Wound Measurements Length: (cm) 1.5 Width: (cm) 1.8 Depth: (cm) 0.2 Area: (cm) 2.121 Volume: (cm) 0.424 % Reduction in Area: 18.2% % Reduction in Volume: -63.7% Wound Description Full Thickness Without Exposed Classification: Support Structures Periwound Skin Texture Texture Color No Abnormalities Noted: No No Abnormalities Noted: No Moisture No Abnormalities Noted: No Treatment Notes Wound #4 (Right, Medial Lower Leg) Tartt, Nayson ([id number]) 1. Cleansed with: Cleanse wound with antibacterial soap and water 3. Peri-wound Care: Barrier cream Moisturizing lotion 4. Dressing Applied: Hydrafera Blue 5. Secondary Dressing Applied ABD Pad 7. Secured with 4-Layer Compression System - Right Lower Extremity Notes unna to anchor Electronic Signature(s) Signed: 11/23/2016 2:32:03 PM By: [name] [name], RN Entered By: [name] on 11/23/2016 08:37:24 Woodfield, [name]  ([id number]) -------------------------------------------------------------------------------- Vitals Details Patient Name: [name] Date of Service: 11/23/2016 8:15 AM Medical Record Number: [id number] Patient Account Number: [id number] Date of Birth/Sex: [birth year] (66 y.o. Male) Treating RN: Afful, RN, [name], Velva Harman Primary Care Mayley Lish: Royetta Crochet Other Clinician: Referring Kennie Snedden: Royetta Crochet Treating Trevontae Lindahl/Extender: STONE III, HOYT Weeks in Treatment: 22 Vital Signs Time Taken: 08:13 Temperature (F): 97.4 Height (in): 74 Pulse (bpm): 53 Weight (lbs): 252 Respiratory Rate (breaths/min): 17 Body Mass Index (BMI): 32.4 Blood Pressure (mmHg): 145/80 Reference Range: 80 - 120 mg / dl Electronic Signature(s) Signed: 11/23/2016 2:32:03 PM By: [name] [name], RN Entered By: [name] on 11/23/2016 08:13:42

## 2016-11-26 DIAGNOSIS — I87331 Chronic venous hypertension (idiopathic) with ulcer and inflammation of right lower extremity: Secondary | ICD-10-CM | POA: Diagnosis not present

## 2016-11-27 NOTE — Progress Notes (Signed)
Mark, [name] ([id number]) Visit Report for 11/26/2016 Arrival Information Details Patient Name: [name], [name] Date of Service: 11/26/2016 9:15 AM Medical Record Number: [id number] Patient Account Number: [id number] Date of Birth/Sex: 1951/01/08 (66 y.o. Male) Treating RN: [name] Primary Care [name]: [name] Other Clinician: Referring [name]: [name] Treating [name]/Extender: [name] Weeks in Treatment: 23 Visit Information History Since Last Visit Added or deleted any medications: No Patient Arrived: Ambulatory Any new allergies or adverse reactions: No Arrival Time: 09:15 Had a fall or experienced change in No Accompanied By: self activities of daily living that may affect Transfer Assistance: None risk of falls: Patient Identification Verified: Yes Signs or symptoms of abuse/neglect since last No Secondary Verification Process Yes visito Completed: Hospitalized since last visit: No Patient Requires Transmission- No Has Dressing in Place as Prescribed: Yes Based Precautions: Has Compression in Place as Prescribed: Yes Patient Has Alerts: Yes Pain Present Now: No Patient Alerts: Patient on Blood Thinner warfarin Electronic Signature(s) Signed: 11/26/2016 10:37:40 AM By: [name], [name], RN, CWS, [name], [name] Entered By: [name], [name], RN, CWS, [name] on 11/26/2016 09:22:00 [name], [name] ([id number]) -------------------------------------------------------------------------------- Compression Therapy Details Patient Name: [name] Date of Service: 11/26/2016 9:15 AM Medical Record Number: [id number] Patient Account Number: [id number] Date of Birth/Sex: 09-09-1950 (66 y.o. Male) Treating RN: [name] Primary Care Gwenda Heiner: [name] Other Clinician: Referring Denali Sharma: [name] Treating Mailen Newborn/Extender: [name] III, Mark Weeks in Treatment: 23 Compression Therapy Performed for Wound Wound #4 Right,Medial Lower  Leg Assessment: Performed By: Clinician Mark Barman, RN Compression Type: Four Layer Pre Treatment ABI: 1 Electronic Signature(s) Signed: 11/26/2016 10:37:40 AM By: [name], [name], RN, CWS, [name], [name] Entered By: [name], [name], RN, CWS, [name] on 11/26/2016 09:24:13 [name], [name] ([id number]) -------------------------------------------------------------------------------- Encounter Discharge Information Details Patient Name: [name] Date of Service: 11/26/2016 9:15 AM Medical Record Number: [id number] Patient Account Number: [id number] Date of Birth/Sex: [birth year] (66 y.o. Male) Treating RN: [name] Primary Care Terez Freimark: [name] Other Clinician: Referring Dama Hedgepeth: [name] Treating [name]: [name] Weeks in Treatment: 23 Encounter Discharge Information Items Discharge Pain Level: 0 Discharge Condition: Stable Ambulatory Status: Ambulatory Discharge Destination: Home Private Transportation: Auto Accompanied By: self Schedule Follow-up Appointment: Yes Medication Reconciliation completed and Yes provided to Patient/Care Kimm Sider: Clinical Summary of Care: Electronic Signature(s) Signed: 11/26/2016 10:37:40 AM By: [name], [name], RN, CWS, [name], [name] Entered By: [name], [name], RN, CWS, [name] on 11/26/2016 09:34:04 [name], [name] ([id number]) -------------------------------------------------------------------------------- Patient/Caregiver Education Details Patient Name: [name] Date of Service: 11/26/2016 9:15 AM Medical Record Number: [id number] Patient Account Number: [id number] Date of Birth/Gender: 1950/12/21 (66 y.o. Male) Treating RN: [name] Primary Care Physician: [name] Other Clinician: Referring Physician: Royetta [name] Treating Physician/Extender: Worthy [name] Weeks in Treatment: 23 Education Assessment Education Provided To: Patient Education Topics Provided Venous: Handouts: Controlling Swelling  with Compression Stockings Methods: Explain/Verbal Responses: State content correctly Electronic Signature(s) Signed: 11/26/2016 10:37:40 AM By: [name], [name], RN, CWS, [name], [name] Entered By: [name], [name], RN, CWS, [name] on 11/26/2016 09:33:48 [name], [name] ([id number]) -------------------------------------------------------------------------------- Wound Assessment Details Patient Name: [name] Date of Service: 11/26/2016 9:15 AM Medical Record Number: [id number] Patient Account Number: [id number] Date of Birth/Sex: 20-Jul-1950 (66 y.o. Male) Treating RN: [name] Primary Care Sakai Heinle: [name] Other Clinician: Referring Rilie Glanz: [name] Treating Amantha Sklar/Extender: [name] III, Mark Weeks in Treatment: 23 Wound Status Wound Number: 4 Primary Venous Leg Ulcer Etiology: Wound Location: Right Lower Leg - Medial Wound Status: Open Wounding Event: Gradually Appeared  Comorbid Arrhythmia, Hypertension, Gout, Date Acquired: 06/02/2016 History: Neuropathy Weeks Of Treatment: 23 Clustered Wound: No Photos Wound Measurements Length: (cm) 1 Width: (cm) 1.5 Depth: (cm) 0.2 Area: (cm) 1.178 Volume: (cm) 0.236 % Reduction in Area: 54.6% % Reduction in Volume: 8.9% Epithelialization: Small (1-33%) Tunneling: No Undermining: No Wound Description Full Thickness Without Exposed Classification: Support Structures Exudate Medium Amount: Exudate Type: Serosanguineous Exudate Color: red, brown Foul Odor After Cleansing: No Slough/Fibrino No Wound Bed Granulation Amount: Large (67-100%) Exposed Structure Granulation Quality: Red, Hyper-granulation Fascia Exposed: No Necrotic Amount: None Present (0%) Fat Layer (Subcutaneous Tissue) Exposed: No Tendon Exposed: No Muscle Exposed: No Joint Exposed: No Bone Exposed: No [name] ([id number]) Periwound Skin Texture Texture Color No Abnormalities Noted: No No Abnormalities Noted: No Callus:  No Atrophie Blanche: No Crepitus: No Cyanosis: No Excoriation: No Ecchymosis: No Induration: No Erythema: No Rash: No Hemosiderin Staining: No Scarring: No Mottled: No Pallor: No Moisture Rubor: No No Abnormalities Noted: No Dry / Scaly: No Maceration: No Wound Preparation Ulcer Cleansing: Rinsed/Irrigated with Saline, Wound Cleanser Topical Anesthetic Applied: None Treatment Notes Wound #4 (Right, Medial Lower Leg) 1. Cleansed with: Cleanse wound with antibacterial soap and water 3. Peri-wound Care: Barrier cream 4. Dressing Applied: Hydrafera Blue 5. Secondary Dressing Applied ABD Pad 7. Secured with 4-Layer Compression System - Right Lower Extremity Notes unna to anchor Electronic Signature(s) Signed: 11/26/2016 10:37:40 AM By: [name], [name], RN, CWS, [name], [name] Entered By: [name], [name], RN, CWS, [name] on 11/26/2016 09:23:53

## 2016-11-30 ENCOUNTER — Encounter: Payer: Medicare Other | Admitting: Internal Medicine

## 2016-11-30 DIAGNOSIS — I87331 Chronic venous hypertension (idiopathic) with ulcer and inflammation of right lower extremity: Secondary | ICD-10-CM | POA: Diagnosis not present

## 2016-12-01 NOTE — Progress Notes (Signed)
[name], [name] ([id number]) Visit Report for 11/30/2016 Arrival Information Details [name], Date of Service: 11/30/2016 8:15 AM Patient Name: [name] Patient Account Number: [id number] Medical Record Treating RN: Baruch Gouty, RN, [name], Velva Harman [id number] Number: Other Clinician: Date of Birth/Sex: 01-23-1951 (66 y.o. Male) Treating [name] Primary Care Jennavecia Schwier: [name] Jaquita Bessire/Extender: G Referring Scot Shiraishi: Doristine Locks in Treatment: 23 Visit Information History Since Last Visit All ordered tests and consults were completed: No Patient Arrived: Ambulatory Added or deleted any medications: No Arrival Time: 08:19 Any new allergies or adverse reactions: No Accompanied By: self Had a fall or experienced change in No Transfer Assistance: None activities of daily living that may affect Patient Identification Verified: Yes risk of falls: Secondary Verification Process Yes Signs or symptoms of abuse/neglect since last No Completed: visito Patient Requires Transmission- No Hospitalized since last visit: No Based Precautions: Has Dressing in Place as Prescribed: Yes Patient Has Alerts: Yes Has Compression in Place as Prescribed: Yes Patient Alerts: Patient on Blood Pain Present Now: No Thinner warfarin Electronic Signature(s) Signed: 11/30/2016 11:21:12 AM By: [name] [name], RN Entered By: [name] on 11/30/2016 08:19:33 [name], [name] ([id number]) -------------------------------------------------------------------------------- Encounter Discharge Information Details [name], Date of Service: 11/30/2016 8:15 AM Patient Name: [name] Patient Account Number: [id number] Medical Record Treating RN: Baruch Gouty RN, [name], Velva Harman [id number] Number: Other Clinician: Date of Birth/Sex: [birth year] (65 y.o. Male) Treating [name] Primary Care Jhett Fretwell: [name] Shannell Mikkelsen/Extender: G Referring Quatisha Zylka: Doristine Locks in Treatment: 23 Encounter  Discharge Information Items Discharge Pain Level: 0 Discharge Condition: Stable Ambulatory Status: Ambulatory Discharge Destination: Home Transportation: Private Auto Accompanied By: self Schedule Follow-up Appointment: No Medication Reconciliation completed No and provided to Patient/Care Natesha Hassey: Patient Clinical Summary of Care: Declined Electronic Signature(s) Signed: 11/30/2016 8:56:36 AM By: [name] [name], RN Previous Signature: 11/30/2016 8:48:03 AM Version By: Ruthine Dose Entered By: [name] on 11/30/2016 08:56:36 [name], [name] ([id number]) -------------------------------------------------------------------------------- Lower Extremity Assessment Details [name], Date of Service: 11/30/2016 8:15 AM Patient Name: [name] Patient Account Number: [id number] Medical Record Treating RN: Baruch Gouty RN, [name], Velva Harman [id number] Number: Other Clinician: Date of Birth/Sex: 1950-07-16 (66 y.o. Male) Treating [name] Primary Care Donavan Kerlin: [name] Breena Bevacqua/Extender: G Referring Tanysha Quant: Doristine Locks in Treatment: 23 Vascular Assessment Claudication: Claudication Assessment [Right:None] Pulses: Dorsalis Pedis Palpable: [Right:Yes] Posterior Tibial Extremity colors, hair growth, and conditions: Extremity Color: [Right:Hyperpigmented] Hair Growth on Extremity: [Right:Yes] Temperature of Extremity: [Right:Warm] Capillary Refill: [Right:< 3 seconds] Toe Nail Assessment Left: Right: Thick: Yes Discolored: Yes Deformed: Yes Improper Length and Hygiene: Yes Electronic Signature(s) Signed: 11/30/2016 11:21:12 AM By: [name] [name], RN Entered By: [name] on 11/30/2016 08:34:29 [name], [name] ([id number]) -------------------------------------------------------------------------------- Multi Wound Chart Details [name], Date of Service: 11/30/2016 8:15 AM Patient Name: [name] Patient Account Number: [id number] Medical Record Treating  RN: Baruch Gouty RN, [name], Velva Harman [id number] Number: Other Clinician: Date of Birth/Sex: 07/14/1950 (65 y.o. Male) Treating [name] Primary Care Math Brazie: [name] [name] Bromwell/Extender: G Referring Gabriell Daigneault: [name] Weeks in Treatment: 23 Vital Signs Height(in): 74 Pulse(bpm): 52 Weight(lbs): 252 Blood Pressure 136/77 (mmHg): Body Mass Index(BMI): 32 Temperature(F): 97.6 Respiratory Rate 17 (breaths/min): Photos: [4:No Photos] [N/A:N/A] Wound Location: [4:Right Lower Leg - Medial N/A] Wounding Event: [4:Gradually Appeared] [N/A:N/A] Primary Etiology: [4:Venous Leg Ulcer] [N/A:N/A] Comorbid History: [4:Arrhythmia, Hypertension, N/A Gout, Neuropathy] Date Acquired: [4:06/02/2016] [N/A:N/A] Weeks of Treatment: [4:23] [N/A:N/A] Wound Status: [4:Open] [N/A:N/A] Measurements L x W x D 0.8x1.5x0.2 [N/A:N/A] (cm) Area (cm) : [4:0.942] [N/A:N/A] Volume (cm) : [4:0.188] [N/A:N/A] % Reduction in Area: [  4:63.70%] [N/A:N/A] % Reduction in Volume: 27.40% [N/A:N/A] Classification: [4:Full Thickness Without Exposed Support Structures] [N/A:N/A] Exudate Amount: [4:Medium] [N/A:N/A] Exudate Type: [4:Serosanguineous] [N/A:N/A] Exudate Color: [4:red, brown] [N/A:N/A] Wound Margin: [4:Distinct, outline attached N/A] Granulation Amount: [4:Large (67-100%)] [N/A:N/A] Granulation Quality: [4:Red, Hyper-granulation] [N/A:N/A] Necrotic Amount: [4:None Present (0%)] [N/A:N/A] Exposed Structures: [4:Fat Layer (Subcutaneous N/A Tissue) Exposed: Yes Fascia: No] Tendon: No Muscle: No Joint: No Bone: No Epithelialization: Medium (34-66%) N/A N/A Periwound Skin Texture: Excoriation: No N/A N/A Induration: No Callus: No Crepitus: No Rash: No Scarring: No Periwound Skin Maceration: No N/A N/A Moisture: Dry/Scaly: No Periwound Skin Color: Hemosiderin Staining: Yes N/A N/A Atrophie Blanche: No Cyanosis: No Ecchymosis: No Erythema: No Mottled: No Pallor: No Rubor: No Tenderness  on No N/A N/A Palpation: Wound Preparation: Ulcer Cleansing: N/A N/A Rinsed/Irrigated with Saline, Wound Cleanser, Other: soap and water Topical Anesthetic Applied: None Treatment Notes Electronic Signature(s) Signed: 11/30/2016 5:59:51 PM By: [name] MD Entered By: [name] on 11/30/2016 08:35:06 Hogate, [name] ([id number]) -------------------------------------------------------------------------------- Multi-Disciplinary Care Plan Details Grose, Date of Service: 11/30/2016 8:15 AM Patient Name: [name] Patient Account Number: [id number] Medical Record Treating RN: Baruch Gouty RN, [name], Velva Harman [id number] Number: Other Clinician: Date of Birth/Sex: 1950-12-01 (66 y.o. Male) Treating [name] Primary Care Javed Cotto: [name] Remmington Teters/Extender: G Referring Talulah Schirmer: Doristine Locks in Treatment: 23 Active Inactive ` Orientation to the Wound Care Program Nursing Diagnoses: Knowledge deficit related to the wound healing center program Goals: Patient/caregiver will verbalize understanding of the [facility] Date Initiated: 06/16/2016 Target Resolution Date: 08/15/2016 Goal Status: Active Interventions: Provide education on orientation to the wound center Notes: ` Venous Leg Ulcer Nursing Diagnoses: Potential for venous Insuffiency (use before diagnosis confirmed) Goals: Patient will maintain optimal edema control Date Initiated: 06/16/2016 Target Resolution Date: 08/19/2016 Goal Status: Active Interventions: Compression as ordered Notes: ` Wound/Skin Impairment Nursing Diagnoses: NAHEIM, BURGEN ([id number]) Impaired tissue integrity Goals: Patient/caregiver will verbalize understanding of skin care regimen Date Initiated: 06/16/2016 Target Resolution Date: 07/15/2016 Goal Status: Active Ulcer/skin breakdown will have a volume reduction of 30% by week 4 Date Initiated: 06/16/2016 Target Resolution Date: 07/15/2016 Goal  Status: Active Ulcer/skin breakdown will have a volume reduction of 50% by week 8 Date Initiated: 06/16/2016 Target Resolution Date: 07/15/2016 Goal Status: Active Ulcer/skin breakdown will have a volume reduction of 80% by week 12 Date Initiated: 06/16/2016 Target Resolution Date: 07/15/2016 Goal Status: Active Ulcer/skin breakdown will heal within 14 weeks Date Initiated: 06/16/2016 Target Resolution Date: 07/15/2016 Goal Status: Active Interventions: Assess patient/caregiver ability to obtain necessary supplies Assess patient/caregiver ability to perform ulcer/skin care regimen upon admission and as needed Assess ulceration(s) every visit Notes: Electronic Signature(s) Signed: 11/30/2016 11:21:12 AM By: [name] [name], RN Entered By: [name] on 11/30/2016 08:34:34 Stetler, [name] -------------------------------------------------------------------------------- Pain Assessment Details Schirmer, Date of Service: 11/30/2016 8:15 AM Patient Name: [name] Patient Account Number: [id number] Medical Record Treating RN: Baruch Gouty RN, [name], Velva Harman [id number] Number: Other Clinician: Date of Birth/Sex: 1950-06-06 (66 y.o. Male) Treating [name] Primary Care Kirsti Mcalpine: [name] Laurelai Lepp/Extender: G Referring Chieko Neises: Doristine Locks in Treatment: 23 Active Problems Location of Pain Severity and Description of Pain Patient Has Paino No Site Locations With Dressing Change: No Pain Management and Medication Current Pain Management: Electronic Signature(s) Signed: 11/30/2016 11:21:12 AM By: [name] [name], RN Entered By: [name] on 11/30/2016 08:19:40 Reny, [name] -------------------------------------------------------------------------------- Patient/Caregiver Education Details Sorter, Date of Service: 11/30/2016 8:15 AM Patient Name: [facility] Patient Account Number: [id number] Medical Record Treating  RN: Baruch Gouty, RN, [name],  Velva Harman [id number] Number: Other Clinician: Date of Birth/Gender: June 05, 1950 (66 y.o. Male) Treating [name], MICHAEL Primary Care Physician/Extender: Tonye Pearson Physician: Suella Grove in Treatment: 23 Referring Physician: Royetta [name] Education Assessment Education Provided To: Patient Education Topics Provided Welcome To [facility]: Methods: Explain/Verbal Responses: State content correctly Electronic Signature(s) Signed: 11/30/2016 11:21:12 AM By: [name] [name], RN Entered By: [name] on 11/30/2016 08:56:49 Pen, [name] ([id number]) -------------------------------------------------------------------------------- Wound Assessment Details Neisen, Date of Service: 11/30/2016 8:15 AM Patient Name: [name] Patient Account Number: [id number] Medical Record Treating RN: Baruch Gouty, RN, [name], Velva Harman [id number] Number: Other Clinician: Date of Birth/Sex: Jul 27, 1950 (66 y.o. Male) Treating [name], [place] Primary Care Celines Femia: [name] Nakul Avino/Extender: G Referring Guthrie Lemme: Doristine Locks in Treatment: 23 Wound Status Wound Number: 4 Primary Venous Leg Ulcer Etiology: Wound Location: Right Lower Leg - Medial Wound Status: Open Wounding Event: Gradually Appeared Comorbid Arrhythmia, Hypertension, Gout, Date Acquired: 06/02/2016 History: Neuropathy Weeks Of Treatment: 23 Clustered Wound: No Photos Photo Uploaded By: [name] on 11/30/2016 15:45:08 Wound Measurements Length: (cm) 0.8 Width: (cm) 1.5 Depth: (cm) 0.2 Area: (cm) 0.942 Volume: (cm) 0.188 % Reduction in Area: 63.7% % Reduction in Volume: 27.4% Epithelialization: Medium (34-66%) Tunneling: No Undermining: No Wound Description Full Thickness Without Exposed Classification: Support Structures Wound Margin: Distinct, outline attached Messimer, Dequon ([id number]) Foul Odor After Cleansing: No Slough/Fibrino No Exudate Medium Amount: Exudate Type:  Serosanguineous Exudate Color: red, brown Wound Bed Granulation Amount: Large (67-100%) Exposed Structure Granulation Quality: Red, Hyper-granulation Fascia Exposed: No Necrotic Amount: None Present (0%) Fat Layer (Subcutaneous Tissue) Exposed: Yes Tendon Exposed: No Muscle Exposed: No Joint Exposed: No Bone Exposed: No Periwound Skin Texture Texture Color No Abnormalities Noted: No No Abnormalities Noted: No Callus: No Atrophie Blanche: No Crepitus: No Cyanosis: No Excoriation: No Ecchymosis: No Induration: No Erythema: No Rash: No Hemosiderin Staining: Yes Scarring: No Mottled: No Pallor: No Moisture Rubor: No No Abnormalities Noted: No Dry / Scaly: No Maceration: No Wound Preparation Ulcer Cleansing: Rinsed/Irrigated with Saline, Wound Cleanser, Other: soap and water, Topical Anesthetic Applied: None Treatment Notes Wound #4 (Right, Medial Lower Leg) 1. Cleansed with: Cleanse wound with antibacterial soap and water 3. Peri-wound Care: Barrier cream Moisturizing lotion 4. Dressing Applied: Hydrafera Blue 5. Secondary Dressing Applied ABD Pad 7. Secured with 4-Layer Compression System - Right Lower Extremity Notes VICTORIO, CREEDEN ([id number]) Louretta Parma to anchor Electronic Signature(s) Signed: 11/30/2016 11:21:12 AM By: [name] [name], RN Entered By: [name] on 11/30/2016 08:34:01 [place]field, [name] -------------------------------------------------------------------------------- Vitals Details Kooi, Date of Service: 11/30/2016 8:15 AM Patient Name: [name] Patient Account Number: [id number] Medical Record Treating RN: Baruch Gouty, RN, [name], Velva Harman [id number] Number: Other Clinician: Date of Birth/Sex: 1951-04-15 (65 y.o. Male) Treating [name], [place] Primary Care Urias Sheek: [name] Kodi Guerrera/Extender: G Referring Baylor Cortez: Doristine Locks in Treatment: 23 Vital Signs Time Taken: 08:19 Temperature (F): 97.6 Height (in):  74 Pulse (bpm): 52 Weight (lbs): 252 Respiratory Rate (breaths/min): 17 Body Mass Index (BMI): 32.4 Blood Pressure (mmHg): 136/77 Reference Range: 80 - 120 mg / dl Electronic Signature(s) Signed: 11/30/2016 11:21:12 AM By: [name] [name], RN Entered By: [name] on 11/30/2016 08:20:02
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
## 2016-12-03 DIAGNOSIS — I87331 Chronic venous hypertension (idiopathic) with ulcer and inflammation of right lower extremity: Secondary | ICD-10-CM | POA: Diagnosis not present

## 2016-12-06 NOTE — Progress Notes (Signed)
BINH, DOTEN ([id number]) Visit Report for 12/03/2016 Arrival Information Details Patient Name: CALEEL, KINER Date of Service: 12/03/2016 8:15 AM Medical Record Number: [id number] Patient Account Number: [id number] Date of Birth/Sex: [birth year] (66 y.o. Male) Treating RN: Baruch Gouty, RN, [name], Velva Harman Primary Care Preslea Rhodus: Royetta Crochet Other Clinician: Referring Bingham Millette: Royetta Crochet Treating Rima Blizzard/Extender: Frann Rider in Treatment: 24 Visit Information History Since Last Visit All ordered tests and consults were completed: No Patient Arrived: Ambulatory Added or deleted any medications: No Arrival Time: 08:10 Any new allergies or adverse reactions: No Accompanied By: self Had a fall or experienced change in No Transfer Assistance: None activities of daily living that may affect Patient Identification Verified: Yes risk of falls: Secondary Verification Process Yes Signs or symptoms of abuse/neglect since last No Completed: visito Patient Requires Transmission- No Hospitalized since last visit: No Based Precautions: Has Dressing in Place as Prescribed: Yes Patient Has Alerts: Yes Has Compression in Place as Prescribed: Yes Patient Alerts: Patient on Blood Pain Present Now: No Thinner warfarin Electronic Signature(s) Signed: 12/03/2016 3:47:55 PM By: [name] [name], RN Entered By: [name] on 12/03/2016 [place], [name] ([id number]) -------------------------------------------------------------------------------- Encounter Discharge Information Details Patient Name: [name] Date of Service: 12/03/2016 8:15 AM Medical Record Number: [id number] Patient Account Number: [id number] Date of Birth/Sex: 14-Jul-1950 (66 y.o. Male) Treating RN: Baruch Gouty, RN, [name], Velva Harman Primary Care Cheron Coryell: Royetta Crochet Other Clinician: Referring Henley Blyth: Royetta Crochet Treating Blakeleigh Domek/Extender: Frann Rider in Treatment: 24 Encounter Discharge  Information Items Discharge Pain Level: 0 Discharge Condition: Stable Ambulatory Status: Ambulatory Discharge Destination: Home Private Transportation: Auto Accompanied By: self Schedule Follow-up Appointment: No Medication Reconciliation completed and No provided to Patient/Care Lesha Jager: Clinical Summary of Care: Electronic Signature(s) Signed: 12/03/2016 3:47:55 PM By: [name] [name], RN Entered By: [name] on 12/03/2016 08:25:27 Idd[place], [name] ([id number]) -------------------------------------------------------------------------------- Patient/Caregiver Education Details [place], Date of Service: 12/03/2016 8:15 AM Patient Name: [facility] Patient Account Number: [id number] Medical Record Afful, RN, [name], [id number] Treating RN: Number: Velva Harman Date of Birth/Gender: [birth year] (66 y.o. Male) Other Clinician: Primary Care Physician: Joycelyn Das Referring Physician: Royetta Crochet Physician/Extender: Weeks in Treatment: 24 Education Assessment Education Provided To: Patient Education Topics Provided Welcome To [facility]: Methods: Explain/Verbal Responses: State content correctly Electronic Signature(s) Signed: 12/03/2016 3:47:55 PM By: [name] [name], RN Entered By: [name] on 12/03/2016 08:24:02

## 2016-12-07 ENCOUNTER — Encounter: Payer: Medicare Other | Admitting: Internal Medicine

## 2016-12-07 DIAGNOSIS — I87331 Chronic venous hypertension (idiopathic) with ulcer and inflammation of right lower extremity: Secondary | ICD-10-CM | POA: Diagnosis not present

## 2016-12-07 NOTE — Progress Notes (Addendum)
JESSUP, OGAS ([id number]) Visit Report for 12/07/2016 Arrival Information Details Padron, Date of Service: 12/07/2016 8:15 AM Patient Name: [facility] Patient Account Number: [id number] Medical Record Treating RN: Baruch Gouty, RN, [name], Velva Harman [id number] Number: Other Clinician: Date of Birth/Sex: [birth year] (66 y.o. Male) Treating [name] Primary Care Constant Mandeville: Royetta Crochet Ivorie Uplinger/Extender: G Referring Myleah Cavendish: Doristine Locks in Treatment: 24 Visit Information History Since Last Visit All ordered tests and consults were completed: No Patient Arrived: Ambulatory Added or deleted any medications: No Arrival Time: 08:08 Any new allergies or adverse reactions: No Accompanied By: self Had a fall or experienced change in No Transfer Assistance: None activities of daily living that may affect Patient Identification Verified: Yes risk of falls: Secondary Verification Process Yes Signs or symptoms of abuse/neglect since last No Completed: visito Patient Requires Transmission- No Hospitalized since last visit: No Based Precautions: Has Dressing in Place as Prescribed: Yes Patient Has Alerts: Yes Has Compression in Place as Prescribed: Yes Patient Alerts: Patient on Blood Pain Present Now: No Thinner warfarin Electronic Signature(s) Signed: 12/07/2016 8:08:51 AM By: [name] [name], RN Entered By: [name] on 12/07/2016 08:08:51 Kerwood, [name] ([id number]) -------------------------------------------------------------------------------- Encounter Discharge Information Details Beem, Date of Service: 12/07/2016 8:15 AM Patient Name: [name] Patient Account Number: [id number] Medical Record Treating RN: Baruch Gouty RN, [name], Velva Harman [id number] Number: Other Clinician: Date of Birth/Sex: 1950/11/14 (66 y.o. Male) Treating [name] Primary Care Oluwatoyin Banales: Royetta Crochet Almin Livingstone/Extender: G Referring Gamal Todisco: Doristine Locks in Treatment: 24 Encounter  Discharge Information Items Discharge Pain Level: 0 Discharge Condition: Stable Ambulatory Status: Ambulatory Discharge Destination: Home Transportation: Private Auto Accompanied By: self Schedule Follow-up Appointment: No Medication Reconciliation completed No and provided to Patient/Care Denina Rieger: Patient Clinical Summary of Care: Declined Electronic Signature(s) Signed: 12/07/2016 8:45:46 AM By: Ruthine Dose Entered By: Ruthine Dose on 12/07/2016 08:45:46 Spillers, [name] ([id number]) -------------------------------------------------------------------------------- Lower Extremity Assessment Details Tigert, Date of Service: 12/07/2016 8:15 AM Patient Name: [name] Patient Account Number: [id number] Medical Record Treating RN: Baruch Gouty, RN, [name], Velva Harman [id number] Number: Other Clinician: Date of Birth/Sex: [birth year] (66 y.o. Male) Treating [name] Primary Care Alexah Kivett: Royetta Crochet Mayuri Staples/Extender: G Referring Aren Pryde: Doristine Locks in Treatment: 24 Vascular Assessment Claudication: Claudication Assessment [Right:None] Pulses: Dorsalis Pedis Palpable: [Right:Yes] Posterior Tibial Extremity colors, hair growth, and conditions: Extremity Color: [Right:Hyperpigmented] Hair Growth on Extremity: [Right:Yes] Temperature of Extremity: [Right:Warm] Capillary Refill: [Right:< 3 seconds] Toe Nail Assessment Left: Right: Thick: Yes Discolored: Yes Deformed: Yes Improper Length and Hygiene: Yes Electronic Signature(s) Signed: 12/07/2016 5:27:52 PM By: [name] [name], RN Entered By: [name] on 12/07/2016 08:15:34 Comella, [name] ([id number]) -------------------------------------------------------------------------------- Multi Wound Chart Details Merkle, Date of Service: 12/07/2016 8:15 AM Patient Name: [name] Patient Account Number: [id number] Medical Record Treating RN: Baruch Gouty RN, [name], Velva Harman [id number] Number: Other Clinician: Date of  Birth/Sex: 12-23-1950 (65 y.o. 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] [N/A:N/A] Comorbid History: [4:Arrhythmia, Hypertension, N/A Gout, Neuropathy] Date Acquired: [4:06/02/2016] [N/A:N/A] Weeks of Treatment: [4:24] [N/A:N/A] Wound Status: [4:Open] [N/A:N/A] Measurements L x W x D 0.4x1.4x0.2 [N/A:N/A] (cm) Area (cm) : [4:0.44] [N/A:N/A] Volume (cm) : [4:0.088] [N/A:N/A] % Reduction in Area: [4:83.00%] [N/A:N/A] % Reduction in Volume: 66.00% [N/A:N/A] Classification: [4:Full Thickness  Without Exposed Support Structures] [N/A:N/A] Exudate Amount: [4:Medium] [N/A:N/A] Exudate Type: [4:Serosanguineous] [N/A:N/A] Exudate Color: [4:red, brown] [N/A:N/A] Wound Margin: [4:Distinct, outline attached N/A] Granulation Amount: [4:Large (67-100%)] [N/A:N/A] Granulation Quality: [4:Red, Hyper-granulation] [N/A:N/A] Necrotic Amount: [4:None Present (0%)] [N/A:N/A] Exposed Structures: [4:Fat Layer (Subcutaneous N/A Tissue) Exposed: Yes Fascia: No] Tendon: No Muscle: No Joint: No Bone: No Epithelialization: Large (67-100%) N/A N/A Periwound Skin Texture: Excoriation: No N/A N/A Induration: No Callus: No Crepitus: No Rash: No Scarring: No Periwound Skin Maceration: No N/A N/A Moisture: Dry/Scaly: No Periwound Skin Color: Hemosiderin Staining: Yes N/A N/A Atrophie Blanche: No Cyanosis: No Ecchymosis: No Erythema: No Mottled: No Pallor: No Rubor: No Temperature: No Abnormality N/A N/A Tenderness on No N/A N/A Palpation: Wound  Preparation: Ulcer Cleansing: N/A N/A Rinsed/Irrigated with Saline, Wound Cleanser, Other: soap and water Topical Anesthetic Applied: None Treatment Notes Electronic Signature(s) Signed: 12/07/2016 5:27:52 PM By: [name] [name], RN Entered By: [name] on 12/07/2016 08:25:05 Feldt, [name] ([id number]) -------------------------------------------------------------------------------- Multi-Disciplinary Care Plan Details Busenbark, Date of Service: 12/07/2016 8:15 AM Patient Name: [name] Patient Account Number: [id number] Medical Record Treating RN: Baruch Gouty, RN, [name], Velva Harman [id number] Number: Other Clinician: Date of Birth/Sex: 06-18-1950 (66 y.o. Male) Treating [name] Primary Care Rufina Kimery: Royetta Crochet Arrion Broaddus/Extender: G Referring Judea Fennimore: Doristine Locks in Treatment: 24 Active Inactive ` Orientation to the Wound Care Program Nursing Diagnoses: Knowledge deficit related to the wound healing center program Goals: Patient/caregiver will verbalize understanding of the [place] Program Date Initiated: 06/16/2016 Target Resolution Date: 08/15/2016 Goal Status: Active Interventions: Provide education on orientation to the wound center Notes: ` Venous Leg Ulcer Nursing Diagnoses: Potential for venous Insuffiency (use before diagnosis confirmed) Goals: Patient will maintain optimal edema control Date Initiated: 06/16/2016 Target Resolution Date: 08/19/2016 Goal Status: Active Interventions: Compression as ordered Notes: ` Wound/Skin Impairment Nursing Diagnoses: TYREES, CHOPIN ([id number]) Impaired tissue integrity Goals: Patient/caregiver will verbalize understanding of skin care regimen Date Initiated: 06/16/2016 Target Resolution Date: 07/15/2016 Goal Status: Active Ulcer/skin breakdown will have a volume reduction of 30% by week 4 Date Initiated: 06/16/2016 Target Resolution Date: 07/15/2016 Goal Status: Active Ulcer/skin breakdown  will have a volume reduction of 50% by week 8 Date Initiated: 06/16/2016 Target Resolution Date: 07/15/2016 Goal Status: Active Ulcer/skin breakdown will have a volume reduction of 80% by week 12 Date Initiated: 06/16/2016 Target Resolution Date: 07/15/2016 Goal Status: Active Ulcer/skin breakdown will heal within 14 weeks Date Initiated: 06/16/2016 Target Resolution Date: 07/15/2016 Goal Status: Active Interventions: Assess patient/caregiver ability to obtain necessary supplies Assess patient/caregiver ability to perform ulcer/skin care regimen upon admission and as needed Assess ulceration(s) every visit Notes: Electronic Signature(s) Signed: 12/07/2016 5:27:52 PM By: [name] [name], RN Entered By: [name] on 12/07/2016 08:24:54 Hubers, [name] ([id number]) -------------------------------------------------------------------------------- Pain Assessment Details Feeser, Date of Service: 12/07/2016 8:15 AM Patient Name: [name] Patient Account Number: [id number] Medical Record Treating RN: Baruch Gouty RN, [name], Velva Harman [id number] Number: Other Clinician: Date of Birth/Sex: Jun 23, 1950 (66 y.o. Male) Treating [name] Primary Care Lamiracle Chaidez: Royetta Crochet Camira Geidel/Extender: G Referring Shelanda Duvall: Doristine Locks in Treatment: 24 Active Problems Location of Pain Severity and Description of Pain Patient Has Paino No Site Locations With Dressing Change: No Pain Management and Medication Current Pain Management: Electronic Signature(s) Signed: 12/07/2016 5:27:52 PM By: [name] [name], RN Entered By: [name] on 12/07/2016 08:15:42 Weyandt, [name] ([id number]) -------------------------------------------------------------------------------- Patient/Caregiver Education Details Yurick, Date of Service: 12/07/2016 8:15 AM Patient Name: [name] Patient Account Number: [id number] Medical Record Treating RN: Baruch Gouty, RN, [name], Velva Harman  [id number] Number: Other Clinician: Date  of Birth/Gender: November 30, 1950 (66 y.o. Male) Treating [name], MICHAEL Primary Care Physician/Extender: Tonye Pearson Physician: Weeks in Treatment: 24 Referring Physician: Royetta Crochet Education Assessment Education Provided To: Patient Education Topics Provided Welcome To [facility] [employer]: Methods: Explain/Verbal Wound/Skin Impairment: Methods: Explain/Verbal Responses: State content correctly Electronic Signature(s) Signed: 12/07/2016 5:27:52 PM By: [name] [name], RN Entered By: [name] on 12/07/2016 08:43:45 Hochberg, [name] ([id number]) -------------------------------------------------------------------------------- Wound Assessment Details Hornung, Date of Service: 12/07/2016 8:15 AM Patient Name: [name] Patient Account Number: [id number] Medical Record Treating RN: Baruch Gouty, RN, [name], Velva Harman [id number] Number: Other Clinician: Date of Birth/Sex: [birth year] (66 y.o. Male) Treating [name], MICHAEL Primary Care Cyril Woodmansee: Royetta Crochet Elif Yonts/Extender: G Referring Caralyn Twining: Doristine Locks in Treatment: 24 Wound Status Wound Number: 4 Primary Venous Leg Ulcer Etiology: Wound Location: Right Lower Leg - Medial Wound Status: Open Wounding Event: Gradually Appeared Comorbid Arrhythmia, Hypertension, Gout, Date Acquired: 06/02/2016 History: Neuropathy Weeks Of Treatment: 24 Clustered Wound: No Photos Photo Uploaded By: [name] on 12/07/2016 17:16:09 Wound Measurements Length: (cm) 0.4 Width: (cm) 1.4 Depth: (cm) 0.2 Area: (cm) 0.44 Volume: (cm) 0.088 % Reduction in Area: 83% % Reduction in Volume: 66% Epithelialization: Large (67-100%) Tunneling: No Undermining: No Wound Description Full Thickness Without Exposed Classification: Support Structures Wound Margin: Distinct, outline attached Posadas, Jaevin ([id number]) Foul Odor After Cleansing: No Slough/Fibrino No Exudate Medium Amount: Exudate Type: Serosanguineous Exudate  Color: red, brown Wound Bed Granulation Amount: Large (67-100%) Exposed Structure Granulation Quality: Red, Hyper-granulation Fascia Exposed: No Necrotic Amount: None Present (0%) Fat Layer (Subcutaneous Tissue) Exposed: Yes Tendon Exposed: No Muscle Exposed: No Joint Exposed: No Bone Exposed: No Periwound Skin Texture Texture Color No Abnormalities Noted: No No Abnormalities Noted: No Callus: No Atrophie Blanche: No Crepitus: No Cyanosis: No Excoriation: No Ecchymosis: No Induration: No Erythema: No Rash: No Hemosiderin Staining: Yes Scarring: No Mottled: No Pallor: No Moisture Rubor: No No Abnormalities Noted: No Dry / Scaly: No Temperature / Pain Maceration: No Temperature: No Abnormality Wound Preparation Ulcer Cleansing: Rinsed/Irrigated with Saline, Wound Cleanser, Other: soap and water, Topical Anesthetic Applied: None Treatment Notes Wound #4 (Right, Medial Lower Leg) 1. Cleansed with: Cleanse wound with antibacterial soap and water 3. Peri-wound Care: Barrier cream Moisturizing lotion 4. Dressing Applied: Hydrafera Blue 5. Secondary Dressing Applied ABD Pad 7. Secured with 4-Layer Compression System - Right Lower Extremity Notes EVER, GUSTAFSON ([id number]) Louretta Parma to anchor Electronic Signature(s) Signed: 12/07/2016 5:27:52 PM By: [name] [name], RN Entered By: [name] on 12/07/2016 08:24:40 Strothman, [name] ([id number]) -------------------------------------------------------------------------------- Vitals Details Yielding, Date of Service: 12/07/2016 8:15 AM Patient Name: [name] Patient Account Number: [id number] Medical Record Treating RN: Baruch Gouty, RN, [name], Velva Harman [id number] Number: Other Clinician: Date of Birth/Sex: 03-24-1951 (65 y.o. Male) Treating [name], [place] Primary Care Eller Sweis: Royetta Crochet Alzada Brazee/Extender: G Referring Tyrianna Lightle: Doristine Locks in Treatment: 24 Vital Signs Time Taken: 08:15 Temperature (F):  97.5 Height (in): 74 Pulse (bpm): 69 Weight (lbs): 252 Respiratory Rate (breaths/min): 16 Body Mass Index (BMI): 32.4 Blood Pressure (mmHg): 140/78 Reference Range: 80 - 120 mg / dl Electronic Signature(s) Signed: 12/07/2016 5:27:52 PM By: [name] [name], RN Entered By: [name] on 12/07/2016 08:16:13
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
## 2016-12-10 DIAGNOSIS — I87331 Chronic venous hypertension (idiopathic) with ulcer and inflammation of right lower extremity: Secondary | ICD-10-CM | POA: Diagnosis not present

## 2016-12-13 NOTE — Progress Notes (Signed)
RAYSHAUN, NEEDLE ([id number]) Visit Report for 12/10/2016 Arrival Information Details Patient Name: [name], [name] Date of Service: 12/10/2016 9:15 AM Medical Record Number: [id number] Patient Account Number: [id number] Date of Birth/Sex: December 25, 1950 (66 y.o. Male) Treating RN: Baruch Gouty, RN, [name], Velva Harman Primary Care Lameshia Hypolite: Royetta Crochet Other Clinician: Referring Clebert Wenger: Royetta Crochet Treating Addie Alonge/Extender: Frann Rider in Treatment: 25 Visit Information History Since Last Visit All ordered tests and consults were completed: No Patient Arrived: Ambulatory Added or deleted any medications: No Arrival Time: 09:11 Any new allergies or adverse reactions: No Accompanied By: self Had a fall or experienced change in No Transfer Assistance: None activities of daily living that may affect Patient Identification Verified: Yes risk of falls: Secondary Verification Process Yes Signs or symptoms of abuse/neglect since last No Completed: visito Patient Requires Transmission- No Hospitalized since last visit: No Based Precautions: Has Dressing in Place as Prescribed: Yes Patient Has Alerts: Yes Has Compression in Place as Prescribed: Yes Patient Alerts: Patient on Blood Pain Present Now: No Thinner warfarin Electronic Signature(s) Signed: 12/10/2016 11:33:38 AM By: [name] [name], RN Entered By: [name] on 12/10/2016 09:11:37 [name], [name] ([id number]) -------------------------------------------------------------------------------- Encounter Discharge Information Details Patient Name: [name] Date of Service: 12/10/2016 9:15 AM Medical Record Number: [id number] Patient Account Number: [id number] Date of Birth/Sex: [birth year] (66 y.o. Male) Treating RN: Baruch Gouty, RN, [name], Velva Harman Primary Care [place] Nine: Royetta Crochet Other Clinician: Referring Kamaiyah Uselton: Royetta Crochet Treating Jeffrey Voth/Extender: Frann Rider in Treatment: 25 Encounter Discharge  Information Items Discharge Pain Level: 0 Discharge Condition: Stable Ambulatory Status: Ambulatory Discharge Destination: Home Private Transportation: Auto Schedule Follow-up Appointment: No Medication Reconciliation completed and No provided to Patient/Care Ladarrious Kirksey: Clinical Summary of Care: Electronic Signature(s) Signed: 12/10/2016 11:33:38 AM By: [name] [name], RN Entered By: [name] on 12/10/2016 09:26:27 [name] ([id number]) -------------------------------------------------------------------------------- Patient/Caregiver Education Details [name], Date of Service: 12/10/2016 9:15 AM Patient Name: [facility] Patient Account Number: [id number] Medical Record Afful, RN, [name], [id number] Treating RN: Number: Velva Harman Date of Birth/Gender: [birth year] (66 y.o. Male) Other Clinician: Primary Care Physician: Joycelyn Das Referring Physician: Royetta Crochet Physician/Extender: Suella Grove in Treatment: 25 Education Assessment Education Provided To: Patient Education Topics Provided Electronic Signature(s) Signed: 12/10/2016 11:33:38 AM By: [name] [name], RN Entered By: [name] on 12/10/2016 09:26:15

## 2016-12-14 ENCOUNTER — Ambulatory Visit (INDEPENDENT_AMBULATORY_CARE_PROVIDER_SITE_OTHER): Payer: Medicare Other | Admitting: Vascular Surgery

## 2016-12-14 ENCOUNTER — Encounter: Payer: Medicare Other | Admitting: Internal Medicine

## 2016-12-14 DIAGNOSIS — I87331 Chronic venous hypertension (idiopathic) with ulcer and inflammation of right lower extremity: Secondary | ICD-10-CM | POA: Diagnosis not present

## 2016-12-14 NOTE — Progress Notes (Addendum)
MAXIMILLIANO, KERSH ([id number]) Visit Report for 12/14/2016 Arrival Information Details Meinecke, Date of Service: 12/14/2016 8:15 AM Patient Name: [facility] Patient Account Number: [id number] Medical Record Treating RN: Baruch Gouty, RN, [name], Velva Harman [id number] Number: Other Clinician: Date of Birth/Sex: 17-Jun-1950 (66 y.o. Male) Treating [name] Primary Care Chanci Ojala: Royetta Crochet Julious Langlois/Extender: G Referring Blaize Nipper: Doristine Locks in Treatment: 25 Visit Information History Since Last Visit All ordered tests and consults were completed: No Patient Arrived: Ambulatory Added or deleted any medications: No Arrival Time: 08:18 Any new allergies or adverse reactions: No Accompanied By: self Had a fall or experienced change in No Transfer Assistance: None activities of daily living that may affect Patient Identification Verified: Yes risk of falls: Secondary Verification Process Yes Signs or symptoms of abuse/neglect since last No Completed: visito Patient Requires Transmission- No Hospitalized since last visit: No Based Precautions: Has Dressing in Place as Prescribed: Yes Patient Has Alerts: Yes Has Compression in Place as Prescribed: Yes Patient Alerts: Patient on Blood Pain Present Now: No Thinner warfarin Electronic Signature(s) Signed: 12/15/2016 1:23:55 PM By: [name] [name], RN Entered By: [name] on 12/14/2016 08:18:48 Balestrieri, [name] ([id number]) -------------------------------------------------------------------------------- Encounter Discharge Information Details Fallaw, Date of Service: 12/14/2016 8:15 AM Patient Name: [name] Patient Account Number: [id number] Medical Record Treating RN: Baruch Gouty RN, [name], Velva Harman [id number] Number: Other Clinician: Date of Birth/Sex: 13-Sep-1950 (66 y.o. Male) Treating [name], MICHAEL Primary Care Jeanett Antonopoulos: Royetta Crochet Jeslynn Hollander/Extender: G Referring Previn Jian: Doristine Locks in Treatment: 25 Encounter  Discharge Information Items Discharge Pain Level: 0 Discharge Condition: Stable Ambulatory Status: Ambulatory Discharge Destination: Home Transportation: Private Auto Schedule Follow-up Appointment: No Medication Reconciliation completed No and provided to Patient/Care Galdino Hinchman: Patient Clinical Summary of Care: Declined Electronic Signature(s) Signed: 12/15/2016 1:23:55 PM By: [name] [name], RN Previous Signature: 12/14/2016 8:46:10 AM Version By: Ruthine Dose Previous Signature: 12/14/2016 8:45:21 AM Version By: Ruthine Dose Entered By: [name] on 12/14/2016 08:46:17 Grammatico, [name] ([id number]) -------------------------------------------------------------------------------- Lower Extremity Assessment Details Tuohey, Date of Service: 12/14/2016 8:15 AM Patient Name: [name] Patient Account Number: [id number] Medical Record Treating RN: Baruch Gouty, RN, [name], Velva Harman [id number] Number: Other Clinician: Date of Birth/Sex: 1950-11-01 (66 y.o. Male) Treating [name] Primary Care Nekayla Heider: Royetta Crochet Arleta Ostrum/Extender: G Referring Breyana Follansbee: Royetta Crochet Weeks in Treatment: 25 Edema Assessment Assessed: [Left: No] [Right: No] Edema: [Left: N] [Right: o] Vascular Assessment Claudication: Claudication Assessment [Right:None] Pulses: Dorsalis Pedis Palpable: [Right:Yes] Posterior Tibial Extremity colors, hair growth, and conditions: Extremity Color: [Right:Hyperpigmented] Hair Growth on Extremity: [Right:Yes] Temperature of Extremity: [Right:Warm] Capillary Refill: [Right:< 3 seconds] Toe Nail Assessment Left: Right: Thick: Yes Discolored: Yes Deformed: Yes Improper Length and Hygiene: Yes Electronic Signature(s) Signed: 12/14/2016 8:14:08 AM By: [name] [name], RN Entered By: [name] on 12/14/2016 08:14:08 Falck, [name] ([id number]) -------------------------------------------------------------------------------- Multi Wound Chart  Details Vogelgesang, Date of Service: 12/14/2016 8:15 AM Patient Name: [name] Patient Account Number: [id number] Medical Record Treating RN: Baruch Gouty RN, [name], Velva Harman [id number] Number: Other Clinician: Date of Birth/Sex: May 17, 1951 (65 y.o. Male) Treating [name] Primary Care Edman Lipsey: Royetta Crochet Aarik Blank/Extender: G Referring Hassani Sliney: Royetta Crochet Weeks in Treatment: 25 Vital Signs Height(in): 74 Pulse(bpm): 51 Weight(lbs): 252 Blood Pressure 146/74 (mmHg): Body Mass Index(BMI): 32 Temperature(F): 98.0 Respiratory Rate 18 (breaths/min): Photos: [4:No Photos] [N/A:N/A] Wound Location: [4:Right Lower Leg - Medial N/A] Wounding Event: [4:Gradually Appeared] [N/A:N/A] Primary Etiology: [4:Venous Leg Ulcer] [N/A:N/A] Comorbid History: [4:Arrhythmia, Hypertension, N/A Gout, Neuropathy] Date Acquired: [4:06/02/2016] [N/A:N/A] Weeks of Treatment: [4:25] [N/A:N/A] Wound Status: [4:Open] [N/A:N/A] Measurements L x W x  D 0.3x0.5x0.2 [N/A:N/A] (cm) Area (cm) : [4:0.118] [N/A:N/A] Volume (cm) : [4:0.024] [N/A:N/A] % Reduction in Area: [4:95.40%] [N/A:N/A] % Reduction in Volume: 90.70% [N/A:N/A] Classification: [4:Full Thickness Without Exposed Support Structures] [N/A:N/A] Exudate Amount: [4:Small] [N/A:N/A] Exudate Type: [4:Serosanguineous] [N/A:N/A] Exudate Color: [4:red, brown] [N/A:N/A] Wound Margin: [4:Distinct, outline attached N/A] Granulation Amount: [4:Large (67-100%)] [N/A:N/A] Granulation Quality: [4:Red, Hyper-granulation] [N/A:N/A] Necrotic Amount: [4:None Present (0%)] [N/A:N/A] Exposed Structures: [4:Fat Layer (Subcutaneous N/A Tissue) Exposed: Yes Fascia: No] Tendon: No Muscle: No Joint: No Bone: No Epithelialization: Large (67-100%) N/A N/A Debridement: Debridement (70350- N/A N/A 11047) Pre-procedure 08:28 N/A N/A Verification/Time Out Taken: Pain Control: Lidocaine 4% Topical N/A N/A Solution Tissue Debrided: Fibrin/Slough, Fat, N/A  N/A Subcutaneous Level: Skin/Subcutaneous N/A N/A Tissue Debridement Area (sq 0.15 N/A N/A cm): Instrument: Curette N/A N/A Bleeding: Minimum N/A N/A Hemostasis Achieved: Pressure N/A N/A Procedural Pain: 0 N/A N/A Post Procedural Pain: 0 N/A N/A Debridement Treatment Procedure was tolerated N/A N/A Response: well Post Debridement 0.3x0.5x0.2 N/A N/A Measurements L x W x D (cm) Post Debridement 0.024 N/A N/A Volume: (cm) Periwound Skin Texture: Excoriation: No N/A N/A Induration: No Callus: No Crepitus: No Rash: No Scarring: No Periwound Skin Maceration: No N/A N/A Moisture: Dry/Scaly: No Periwound Skin Color: Hemosiderin Staining: Yes N/A N/A Atrophie Blanche: No Cyanosis: No Ecchymosis: No Erythema: No Mottled: No Pallor: No Rubor: No Temperature: No Abnormality N/A N/A Tenderness on No N/A N/A Palpation: Wound Preparation: N/A N/A Martucci, [name] ([id number]) Ulcer Cleansing: Rinsed/Irrigated with Saline, Wound Cleanser, Other: soap and water Topical Anesthetic Applied: None Procedures Performed: Debridement N/A N/A Treatment Notes Electronic Signature(s) Signed: 12/14/2016 6:05:23 PM By: [name] MD Entered By: [name] on 12/14/2016 08:39:20 Manthei, [name] ([id number]) -------------------------------------------------------------------------------- Multi-Disciplinary Care Plan Details Sinagra, Date of Service: 12/14/2016 8:15 AM Patient Name: [name] Patient Account Number: [id number] Medical Record Treating RN: Baruch Gouty RN, [name], Velva Harman [id number] Number: Other Clinician: Date of Birth/Sex: [birth year] (66 y.o. Male) Treating [name] Primary Care Filicia Scogin: Royetta Crochet Aniayah Alaniz/Extender: G Referring Tyton Abdallah: Doristine Locks in Treatment: 25 Active Inactive ` Orientation to the Wound Care Program Nursing Diagnoses: Knowledge deficit related to the wound healing center program Goals: Patient/caregiver will  verbalize understanding of the [facility] Program Date Initiated: 06/16/2016 Target Resolution Date: 08/15/2016 Goal Status: Active Interventions: Provide education on orientation to the wound center Notes: ` Venous Leg Ulcer Nursing Diagnoses: Potential for venous Insuffiency (use before diagnosis confirmed) Goals: Patient will maintain optimal edema control Date Initiated: 06/16/2016 Target Resolution Date: 08/19/2016 Goal Status: Active Interventions: Compression as ordered Notes: ` Wound/Skin Impairment Nursing Diagnoses: SABER, DICKERMAN ([id number]) Impaired tissue integrity Goals: Patient/caregiver will verbalize understanding of skin care regimen Date Initiated: 06/16/2016 Target Resolution Date: 07/15/2016 Goal Status: Active Ulcer/skin breakdown will have a volume reduction of 30% by week 4 Date Initiated: 06/16/2016 Target Resolution Date: 07/15/2016 Goal Status: Active Ulcer/skin breakdown will have a volume reduction of 50% by week 8 Date Initiated: 06/16/2016 Target Resolution Date: 07/15/2016 Goal Status: Active Ulcer/skin breakdown will have a volume reduction of 80% by week 12 Date Initiated: 06/16/2016 Target Resolution Date: 07/15/2016 Goal Status: Active Ulcer/skin breakdown will heal within 14 weeks Date Initiated: 06/16/2016 Target Resolution Date: 07/15/2016 Goal Status: Active Interventions: Assess patient/caregiver ability to obtain necessary supplies Assess patient/caregiver ability to perform ulcer/skin care regimen upon admission and as needed Assess ulceration(s) every visit Notes: Electronic Signature(s) Signed: 12/15/2016 1:23:55 PM By: [name] [name], RN Entered By: [name] on 12/14/2016 08:29:19 Riffe, [name] ([id number]) --------------------------------------------------------------------------------  Pain Assessment Details Kirst, Date of Service: 12/14/2016 8:15 AM Patient Name: [facility] Patient Account Number:  [id number] Medical Record Treating RN: Afful, RN, [name], Velva Harman [id number] Number: Other Clinician: Date of Birth/Sex: [birth year] (66 y.o. Male) Treating [name], McCune Primary Care Cosme Jacob: Royetta Crochet Demir Titsworth/Extender: G Referring Jeani Fassnacht: Doristine Locks in Treatment: 25 Active Problems Location of Pain Severity and Description of Pain Patient Has Paino No Site Locations With Dressing Change: No Pain Management and Medication Current Pain Management: Electronic Signature(s) Signed: 12/15/2016 1:23:55 PM By: [name] [name], RN Entered By: [name] on 12/14/2016 08:18:55 Bilek, [name] ([id number]) -------------------------------------------------------------------------------- Patient/Caregiver Education Details Pfeffer, Date of Service: 12/14/2016 8:15 AM Patient Name: [name] Patient Account Number: [id number] Medical Record Treating RN: Baruch Gouty, RN, [name], Velva Harman [id number] Number: Other Clinician: Date of Birth/Gender: 1950/12/19 (65 y.o. Male) Treating [name], MICHAEL Primary Care Physician/Extender: Tonye Pearson Physician: Suella Grove in Treatment: 25 Referring Physician: Royetta Crochet Education Assessment Education Provided To: Patient Education Topics Provided Welcome To The [facility]: Methods: Explain/Verbal Responses: State content correctly Electronic Signature(s) Signed: 12/15/2016 1:23:55 PM By: [name] [name], RN Entered By: [name] on 12/14/2016 08:46:27 Hone, [name] ([id number]) -------------------------------------------------------------------------------- Wound Assessment Details Knouff, Date of Service: 12/14/2016 8:15 AM Patient Name: [name] Patient Account Number: [id number] Medical Record Treating RN: Baruch Gouty, RN, [name], Velva Harman [id number] Number: Other Clinician: Date of Birth/Sex: 1951-04-01 (66 y.o. Male) Treating [name], MICHAEL Primary Care Marisha Renier: Royetta Crochet Burrell Hodapp/Extender: G Referring Danelly Hassinger: Doristine Locks in Treatment: 25 Wound Status Wound Number: 4 Primary Venous Leg Ulcer Etiology: Wound Location: Right Lower Leg - Medial Wound Status: Open Wounding Event: Gradually Appeared Comorbid Arrhythmia, Hypertension, Gout, Date Acquired: 06/02/2016 History: Neuropathy Weeks Of Treatment: 25 Clustered Wound: No Photos Photo Uploaded By: [name] on 12/14/2016 15:56:16 Wound Measurements Length: (cm) 0.3 Width: (cm) 0.5 Depth: (cm) 0.2 Area: (cm) 0.118 Volume: (cm) 0.024 % Reduction in Area: 95.4% % Reduction in Volume: 90.7% Epithelialization: Large (67-100%) Tunneling: No Undermining: No Wound Description Full Thickness Without Exposed Classification: Support Structures Wound Margin: Distinct, outline attached Silversmith, Torren ([id number]) Foul Odor After Cleansing: No Slough/Fibrino No Exudate Small Amount: Exudate Type: Serosanguineous Exudate Color: red, brown Wound Bed Granulation Amount: Large (67-100%) Exposed Structure Granulation Quality: Red, Hyper-granulation Fascia Exposed: No Necrotic Amount: None Present (0%) Fat Layer (Subcutaneous Tissue) Exposed: Yes Tendon Exposed: No Muscle Exposed: No Joint Exposed: No Bone Exposed: No Periwound Skin Texture Texture Color No Abnormalities Noted: No No Abnormalities Noted: No Callus: No Atrophie Blanche: No Crepitus: No Cyanosis: No Excoriation: No Ecchymosis: No Induration: No Erythema: No Rash: No Hemosiderin Staining: Yes Scarring: No Mottled: No Pallor: No Moisture Rubor: No No Abnormalities Noted: No Dry / Scaly: No Temperature / Pain Maceration: No Temperature: No Abnormality Wound Preparation Ulcer Cleansing: Rinsed/Irrigated with Saline, Wound Cleanser, Other: soap and water, Topical Anesthetic Applied: None Treatment Notes Wound #4 (Right, Medial Lower Leg) 1. Cleansed with: Cleanse wound with antibacterial soap and water 3. Peri-wound Care: Barrier  cream Moisturizing lotion 4. Dressing Applied: Hydrafera Blue 5. Secondary Dressing Applied ABD Pad 7. Secured with 4-Layer Compression System - Right Lower Extremity Notes DEVARIOUS, PAVEK ([id number]) Louretta Parma to anchor Electronic Signature(s) Signed: 12/15/2016 1:23:55 PM By: [name] [name], RN Entered By: [name] on 12/14/2016 08:25:28 Zalewski, [name] ([id number]) -------------------------------------------------------------------------------- Vitals Details Tromp, Date of Service: 12/14/2016 8:15 AM Patient Name: [name] Patient Account Number: [id number] Medical Record Treating RN: Baruch Gouty RN, [name], Velva Harman [id number] Number: Other Clinician: Date of Birth/Sex: 1951/05/09 (66 y.o. Male) Treating  [name] Primary Care Eldra Word: Royetta Crochet Ijanae Macapagal/Extender: G Referring Kaylon Hitz: Doristine Locks in Treatment: 25 Vital Signs Time Taken: 08:18 Temperature (F): 98.0 Height (in): 74 Pulse (bpm): 51 Weight (lbs): 252 Respiratory Rate (breaths/min): 18 Body Mass Index (BMI): 32.4 Blood Pressure (mmHg): 146/74 Reference Range: 80 - 120 mg / dl Electronic Signature(s) Signed: 12/15/2016 1:23:55 PM By: [name] [name], RN Entered By: [name] on 12/14/2016 08:19:15
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
## 2016-12-17 ENCOUNTER — Encounter: Payer: Medicare Other | Attending: Surgery

## 2016-12-17 DIAGNOSIS — F17210 Nicotine dependence, cigarettes, uncomplicated: Secondary | ICD-10-CM | POA: Insufficient documentation

## 2016-12-17 DIAGNOSIS — M109 Gout, unspecified: Secondary | ICD-10-CM | POA: Insufficient documentation

## 2016-12-17 DIAGNOSIS — I890 Lymphedema, not elsewhere classified: Secondary | ICD-10-CM | POA: Insufficient documentation

## 2016-12-17 DIAGNOSIS — Z86718 Personal history of other venous thrombosis and embolism: Secondary | ICD-10-CM | POA: Diagnosis not present

## 2016-12-17 DIAGNOSIS — I87331 Chronic venous hypertension (idiopathic) with ulcer and inflammation of right lower extremity: Secondary | ICD-10-CM | POA: Diagnosis not present

## 2016-12-17 DIAGNOSIS — L97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: Secondary | ICD-10-CM | POA: Diagnosis not present

## 2016-12-17 DIAGNOSIS — I4891 Unspecified atrial fibrillation: Secondary | ICD-10-CM | POA: Diagnosis not present

## 2016-12-17 DIAGNOSIS — Z7901 Long term (current) use of anticoagulants: Secondary | ICD-10-CM | POA: Diagnosis not present

## 2016-12-17 DIAGNOSIS — Z8673 Personal history of transient ischemic attack (TIA), and cerebral infarction without residual deficits: Secondary | ICD-10-CM | POA: Diagnosis not present

## 2016-12-17 DIAGNOSIS — I10 Essential (primary) hypertension: Secondary | ICD-10-CM | POA: Insufficient documentation

## 2016-12-19 NOTE — Progress Notes (Signed)
Mark, [name] ([id number]) Visit Report for 12/17/2016 Arrival Information Details Patient Name: [name], [name] Date of Service: 12/17/2016 8:15 AM Medical Record Number: [id number] Patient Account Number: [id number] Date of Birth/Sex: [birth year] (66 y.o. Male) Treating RN: Cornell Barman Primary Care Khyree Carillo: Royetta Crochet Other Clinician: Referring Billy Turvey: Royetta Crochet Treating Xzavien Harada/Extender: Frann Rider in Treatment: 26 Visit Information History Since Last Visit Added or deleted any medications: No Patient Arrived: Ambulatory Any new allergies or adverse reactions: No Arrival Time: 08:03 Had a fall or experienced change in No Accompanied By: self activities of daily living that may affect Transfer Assistance: None risk of falls: Patient Identification Verified: Yes Signs or symptoms of abuse/neglect since last No Secondary Verification Process Yes visito Completed: Hospitalized since last visit: No Patient Requires Transmission- No Has Dressing in Place as Prescribed: Yes Based Precautions: Has Compression in Place as Prescribed: Yes Patient Has Alerts: Yes Pain Present Now: No Patient Alerts: Patient on Blood Thinner warfarin Electronic Signature(s) Signed: 12/17/2016 4:26:40 PM By: Gretta Cool, [name], RN, CWS, [name], [name] Entered By: Gretta Cool, [name], RN, CWS, Kim on 12/17/2016 08:04:00 Blinn, [name] -------------------------------------------------------------------------------- Compression Therapy Details Patient Name: [name] Date of Service: 12/17/2016 8:15 AM Medical Record Number: [id number] Patient Account Number: [id number] Date of Birth/Sex: 08-20-1950 (66 y.o. Male) Treating RN: Cornell Barman Primary Care Jovannie Ulibarri: Royetta Crochet Other Clinician: Referring Mccayla Shimada: Royetta Crochet Treating Adithi Gammon/Extender: Frann Rider in Treatment: 26 Compression Therapy Performed for Wound Wound #4 Right,Medial Lower  Leg Assessment: Performed By: Clinician Cornell Barman, RN Compression Type: Four Layer Pre Treatment ABI: 1 Electronic Signature(s) Signed: 12/17/2016 4:26:40 PM By: Gretta Cool, [name], RN, CWS, [name], [name] Entered By: Gretta Cool, [name], RN, CWS, Kim on 12/17/2016 08:11:58 Piotrowski, [name] ([id number]) -------------------------------------------------------------------------------- Encounter Discharge Information Details Patient Name: [name] Date of Service: 12/17/2016 8:15 AM Medical Record Number: [id number] Patient Account Number: [id number] Date of Birth/Sex: 07/11/1950 (65 y.o. Male) Treating RN: Cornell Barman Primary Care Markitta Ausburn: Royetta Crochet Other Clinician: Referring Ahuva Poynor: Royetta Crochet Treating Angelamarie Avakian/Extender: Frann Rider in Treatment: 26 Encounter Discharge Information Items Discharge Pain Level: 0 Discharge Condition: Stable Ambulatory Status: Ambulatory Discharge Destination: Home Private Transportation: Auto Accompanied By: self Schedule Follow-up Appointment: Yes Medication Reconciliation completed and Yes provided to Patient/Care Jhonny Calixto: Clinical Summary of Care: Electronic Signature(s) Signed: 12/17/2016 4:26:40 PM By: Gretta Cool, [name], RN, CWS, [name], [name] Entered By: Gretta Cool, [name], RN, CWS, Kim on 12/17/2016 08:21:20 [name] ([id number]) -------------------------------------------------------------------------------- Patient/Caregiver Education Details Patient Name: [name] Date of Service: 12/17/2016 8:15 AM Medical Record Number: [id number] Patient Account Number: [id number] Date of Birth/Gender: 30-Jul-1950 (66 y.o. Male) Treating RN: Cornell Barman Primary Care Physician: Royetta Crochet Other Clinician: Referring Physician: Royetta Crochet Treating Physician/Extender: Frann Rider in Treatment: 26 Education Assessment Education Provided To: Patient Education Topics Provided Venous: Handouts: Controlling Swelling with  Multilayered Compression Wraps Methods: Demonstration Responses: State content correctly Electronic Signature(s) Signed: 12/17/2016 4:26:40 PM By: Gretta Cool, [name], RN, CWS, [name], [name] Entered By: Gretta Cool, [name], RN, CWS, Kim on 12/17/2016 08:21:04 Pafford, [name] ([id number]) -------------------------------------------------------------------------------- Wound Assessment Details Patient Name: [name] Date of Service: 12/17/2016 8:15 AM Medical Record Number: [id number] Patient Account Number: [id number] Date of Birth/Sex: 01-Dec-1950 (66 y.o. Male) Treating RN: Cornell Barman Primary Care Dannelle Rhymes: Royetta Crochet Other Clinician: Referring Derwood Becraft: Royetta Crochet Treating Jiaire Rosebrook/Extender: Frann Rider in Treatment: 26 Wound Status Wound Number: 4 Primary Venous Leg Ulcer Etiology: Wound Location: Right Lower Leg - Medial Wound Status: Open Wounding Event: Gradually Appeared Comorbid Arrhythmia, Hypertension, Gout,  Date Acquired: 06/02/2016 History: Neuropathy Weeks Of Treatment: 26 Clustered Wound: No Photos Wound Measurements Length: (cm) 0.2 Width: (cm) 0.3 Depth: (cm) 0.1 Area: (cm) 0.047 Volume: (cm) 0.005 % Reduction in Area: 98.2% % Reduction in Volume: 98.1% Epithelialization: Large (67-100%) Tunneling: No Undermining: No Wound Description Full Thickness Without Exposed Classification: Support Structures Wound Margin: Distinct, outline attached Exudate Small Amount: Exudate Type: Serosanguineous Exudate Color: red, brown Foul Odor After Cleansing: No Slough/Fibrino No Wound Bed Granulation Amount: Large (67-100%) Exposed Structure Granulation Quality: Red, Hyper-granulation Fascia Exposed: No Necrotic Amount: None Present (0%) Fat Layer (Subcutaneous Tissue) Exposed: No Tendon Exposed: No Muscle Exposed: No Joint Exposed: No Hargens, Aarik ([id number]) Bone Exposed: No Limited to Skin Breakdown Periwound Skin Texture Texture  Color No Abnormalities Noted: No No Abnormalities Noted: No Callus: No Atrophie Blanche: No Crepitus: No Cyanosis: No Excoriation: No Ecchymosis: No Induration: No Erythema: No Rash: No Hemosiderin Staining: Yes Scarring: No Mottled: No Pallor: No Moisture Rubor: No No Abnormalities Noted: No Dry / Scaly: Yes Temperature / Pain Maceration: No Temperature: No Abnormality Wound Preparation Ulcer Cleansing: Rinsed/Irrigated with Saline, Wound Cleanser, Other: soap and water, Topical Anesthetic Applied: None Treatment Notes Wound #4 (Right, Medial Lower Leg) 1. Cleansed with: Cleanse wound with antibacterial soap and water 3. Peri-wound Care: Barrier cream 4. Dressing Applied: Hydrafera Blue 5. Secondary Dressing Applied ABD Pad 7. Secured with 4-Layer Compression System - Right Lower Extremity Notes unna to Engineer, production) Signed: 12/17/2016 4:26:40 PM By: Gretta Cool, [name], RN, CWS, [name], [name] Entered By: Gretta Cool, [name], RN, CWS, Kim on 12/17/2016 08:11:33

## 2016-12-21 ENCOUNTER — Encounter: Payer: Medicare Other | Admitting: Physician Assistant

## 2016-12-21 DIAGNOSIS — I87331 Chronic venous hypertension (idiopathic) with ulcer and inflammation of right lower extremity: Secondary | ICD-10-CM | POA: Diagnosis not present
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
## 2016-12-22 NOTE — Progress Notes (Signed)
Mark, [name] ([id number]) Visit Report for 12/21/2016 Arrival Information Details Patient Name: Mark, [name] Date of Service: 12/21/2016 8:15 AM Medical Record Number: [id number] Patient Account Number: [id number] Date of Birth/Sex: 06-18-1950 (66 y.o. Male) Treating RN: [name] Primary Care [name]: [name] Other Clinician: Referring [name]: [name] Treating [name]/Extender: [name], [name] [name]: 26 Visit Information History Since Last Visit Added or deleted any medications: No Patient Arrived: Ambulatory Any new allergies or adverse reactions: No Arrival Time: 08:03 Had a fall or experienced change in No Accompanied By: self activities of daily living that may affect Transfer Assistance: None risk of falls: Patient Identification Verified: Yes Signs or symptoms of abuse/neglect since last No Secondary Verification Process Yes visito Completed: Hospitalized since last visit: No Patient Requires Transmission- No Has Dressing in Place as Prescribed: Yes Based Precautions: Has Compression in Place as Prescribed: Yes Patient Has Alerts: Yes Pain Present Now: No Patient Alerts: Patient on Blood Thinner warfarin Electronic Signature(s) Signed: 12/21/2016 5:55:03 PM By: [name] Entered By: [name] on 12/21/2016 08:04:08 [name] ([id number]) -------------------------------------------------------------------------------- Clinic Level of Care Assessment Details Patient Name: [name] Date of Service: 12/21/2016 8:15 AM Medical Record Number: [id number] Patient Account Number: [id number] Date of Birth/Sex: 1950-11-21 (66 y.o. Male) Treating RN: [name] Primary Care Lumi Winslett: [name] Other Clinician: Referring Trayven Lumadue: [name] Treating English Tomer/Extender: [name], [name] [name]: 26 Clinic Level of Care Assessment Items TOOL 4 Quantity Score []  - Use when only an  EandM is performed on FOLLOW-UP visit 0 ASSESSMENTS - Nursing Assessment / Reassessment X - Reassessment of Co-morbidities (includes updates in patient status) 1 10 X - Reassessment of Adherence to [name] Plan 1 5 ASSESSMENTS - Wound and Skin Assessment / Reassessment X - Simple Wound Assessment / Reassessment - one wound 1 5 []  - Complex Wound Assessment / Reassessment - multiple wounds 0 []  - Dermatologic / Skin Assessment (not related to wound area) 0 ASSESSMENTS - Focused Assessment []  - Circumferential Edema Measurements - multi extremities 0 []  - Nutritional Assessment / Counseling / Intervention 0 X - Lower Extremity Assessment (monofilament, tuning fork, pulses) 1 5 []  - Peripheral Arterial Disease Assessment (using hand held doppler) 0 ASSESSMENTS - Ostomy and/or Continence Assessment and Care []  - Incontinence Assessment and Management 0 []  - Ostomy Care Assessment and Management (repouching, etc.) 0 PROCESS - Coordination of Care X - Simple Patient / Family Education for ongoing care 1 15 []  - Complex (extensive) Patient / Family Education for ongoing care 0 []  - Staff obtains Programmer, systems, Records, Test Results / Process Orders 0 []  - Staff telephones HHA, Nursing Homes / Clarify orders / etc 0 []  - Routine Transfer to another Facility (non-emergent condition) 0 [name] ([id number]) []  - Routine Hospital Admission (non-emergent condition) 0 []  - New Admissions / Biomedical engineer / Ordering NPWT, Apligraf, etc. 0 []  - Emergency Hospital Admission (emergent condition) 0 X - Simple Discharge Coordination 1 10 []  - Complex (extensive) Discharge Coordination 0 PROCESS - Special Needs []  - Pediatric / Minor Patient Management 0 []  - Isolation Patient Management 0 []  - Hearing / Language / Visual special needs 0 []  - Assessment of Community assistance (transportation, D/C planning, etc.) 0 []  - Additional assistance / Altered mentation 0 []  - Support  Surface(s) Assessment (bed, cushion, seat, etc.) 0 INTERVENTIONS - Wound Cleansing / Measurement X - Simple Wound Cleansing - one wound 1 5 []  - Complex Wound Cleansing - multiple wounds 0 X -  Wound Imaging (photographs - any number of wounds) 1 5 []  - Wound Tracing (instead of photographs) 0 X - Simple Wound Measurement - one wound 1 5 []  - Complex Wound Measurement - multiple wounds 0 INTERVENTIONS - Wound Dressings []  - Small Wound Dressing one or multiple wounds 0 []  - Medium Wound Dressing one or multiple wounds 0 []  - Large Wound Dressing one or multiple wounds 0 []  - Application of Medications - topical 0 []  - Application of Medications - injection 0 INTERVENTIONS - Miscellaneous []  - External ear exam 0 [name] ([id number]) []  - Specimen Collection (cultures, biopsies, blood, body fluids, etc.) 0 []  - Specimen(s) / Culture(s) sent or taken to Lab for analysis 0 []  - Patient Transfer (multiple staff / [name] / Similar devices) 0 []  - Simple Staple / Suture removal (25 or less) 0 []  - Complex Staple / Suture removal (26 or more) 0 []  - Hypo / Hyperglycemic Management (close monitor of Blood Glucose) 0 []  - Ankle / Brachial Index (ABI) - do not check if billed separately 0 X - Vital Signs 1 5 Has the patient been seen at the hospital within the last three years: Yes Total Score: 70 Level Of Care: New/Established - Level 2 Electronic Signature(s) Signed: 12/21/2016 5:55:03 PM By: [name] Entered By: [name] on 12/21/2016 16:56:40 [name] ([id number]) -------------------------------------------------------------------------------- Encounter Discharge Information Details Patient Name: [name] Date of Service: 12/21/2016 8:15 AM Medical Record Number: [id number] Patient Account Number: [id number] Date of Birth/Sex: 09/26/1950 (66 y.o. Male) Treating RN: [name] Primary Care [name]: [name] Other  Clinician: Referring [name]: [name] Treating [name]/Extender: [name], [name] [name]: 26 Encounter Discharge Information Items Discharge Pain Level: 0 Discharge Condition: Stable Ambulatory Status: Ambulatory Discharge Destination: Home Transportation: Private Auto Accompanied By: self Schedule Follow-up Appointment: No Medication Reconciliation completed No and provided to Patient/Care Charline Hoskinson: Patient Clinical Summary of Care: Declined Electronic Signature(s) Signed: 12/21/2016 8:35:20 AM By: Ruthine Dose Entered By: Ruthine Dose on 12/21/2016 08:35:20 Stotz, [name] ([id number]) -------------------------------------------------------------------------------- Lower Extremity Assessment Details Patient Name: [name] Date of Service: 12/21/2016 8:15 AM Medical Record Number: [id number] Patient Account Number: [id number] Date of Birth/Sex: Nov 29, 1950 (67 y.o. Male) Treating RN: [name] Primary Care Ally Knodel: [name] Other Clinician: Referring Jakeira Seeman: [name] Treating Denman Pichardo/Extender: [name], [name] [name]: 26 Vascular Assessment Pulses: Dorsalis Pedis Palpable: [Right:Yes] Posterior Tibial Extremity colors, hair growth, and conditions: Extremity Color: [Right:Hyperpigmented] Hair Growth on Extremity: [Right:No] Temperature of Extremity: [Right:Warm] Capillary Refill: [Right:< 3 seconds] Electronic Signature(s) Signed: 12/21/2016 5:55:03 PM By: [name] Entered By: [name] on 12/21/2016 08:10:39 Lengacher, [name] ([id number]) -------------------------------------------------------------------------------- Multi Wound Chart Details Patient Name: [name] Date of Service: 12/21/2016 8:15 AM Medical Record Number: [id number] Patient Account Number: [id number] Date of Birth/Sex: 25-Aug-1950 (66 y.o. Male) Treating RN: [name] Primary Care Arieon Corcoran: [name] Other  Clinician: Referring Lavan Imes: [name] Treating Delsie Amador/Extender: [name], [name] [name]: 26 Vital Signs Height(in): 74 Pulse(bpm): 66 Weight(lbs): 252 Blood Pressure 161/76 (mmHg): Body Mass Index(BMI): 32 Temperature(F): 97.8 Respiratory Rate 18 (breaths/min): Photos: [4:No Photos] [N/A:N/A] Wound Location: [4:Right Lower Leg - Medial N/A] Wounding Event: [4:Gradually Appeared] [N/A:N/A] Primary Etiology: [4:Venous Leg Ulcer] [N/A:N/A] Comorbid History: [4:Arrhythmia, Hypertension, N/A Gout, Neuropathy] Date Acquired: [4:06/02/2016] [N/A:N/A] Mark of [name]: [4:26] [N/A:N/A] Wound Status: [4:Healed - Epithelialized] [N/A:N/A] Measurements L x W x D 0x0x0 [N/A:N/A] (cm) Area (cm) : [4:0] [N/A:N/A] Volume (cm) : [4:0] [N/A:N/A] % Reduction in Area: [  4:100.00%] [N/A:N/A] % Reduction in Volume: 100.00% [N/A:N/A] Classification: [4:Full Thickness Without Exposed Support Structures] [N/A:N/A] Exudate Amount: [4:None Present] [N/A:N/A] Wound Margin: [4:Flat and Intact] [N/A:N/A] Granulation Amount: [4:None Present (0%)] [N/A:N/A] Necrotic Amount: [4:None Present (0%)] [N/A:N/A] Epithelialization: [4:Large (67-100%)] [N/A:N/A] Periwound Skin Texture: No Abnormalities Noted N/A Periwound Skin [4:No Abnormalities Noted N/A] Moisture: Periwound Skin Color: No Abnormalities Noted N/A Tenderness on [4:No] [N/A:N/A] Palpation: Wound Preparation: [N/A:N/A] Ulcer Cleansing: Rinsed/Irrigated with Saline Topical Anesthetic Applied: None [name] Notes Electronic Signature(s) Signed: 12/21/2016 5:55:03 PM By: [name] Entered By: [name] on 12/21/2016 08:32:35 Campanile, [name] ([id number]) -------------------------------------------------------------------------------- Multi-Disciplinary Care Plan Details Patient Name: [name] Date of Service: 12/21/2016 8:15 AM Medical Record Number: [id number] Patient Account Number:  [id number] Date of Birth/Sex: 02/08/1951 (66 y.o. Male) Treating RN: [name] Primary Care Meron Bocchino: [name] Other Clinician: Referring Shaina Gullatt: [name] Treating Anaaya Fuster/Extender: [name], [name] [name]: 26 Active Inactive Electronic Signature(s) Signed: 12/21/2016 5:55:03 PM By: [name] Entered By: [name] on 12/21/2016 08:32:26 Fishbaugh, [name] ([id number]) -------------------------------------------------------------------------------- Pain Assessment Details Patient Name: [name] Date of Service: 12/21/2016 8:15 AM Medical Record Number: [id number] Patient Account Number: [id number] Date of Birth/Sex: [birth year] (66 y.o. Male) Treating RN: [name] Primary Care Lorey Pallett: [name] Other Clinician: Referring Vyla Pint: [name] Treating Anwar Crill/Extender: [name], [name] [name]: 26 Active Problems Location of Pain Severity and Description of Pain Patient Has Paino No Site Locations Pain Management and Medication Current Pain Management: Electronic Signature(s) Signed: 12/21/2016 5:55:03 PM By: [name] Entered By: [name] on 12/21/2016 08:04:28 Washabaugh, [name] ([id number]) -------------------------------------------------------------------------------- Patient/Caregiver Education Details Patient Name: [name] Date of Service: 12/21/2016 8:15 AM Medical Record Number: [id number] Patient Account Number: [id number] Date of Birth/Gender: 17-Apr-1951 (66 y.o. Male) Treating RN: [name] Primary Care Physician: [name] Other Clinician: Referring Physician: Royetta [name] Treating Physician/Extender: [name], [name] [name]: 26 Education Assessment Education Provided To: Patient Education Topics Provided Wound/Skin Impairment: Handouts: Other: Please call our office if you have any questions or concerns. Methods: Explain/Verbal Responses: State  content correctly Electronic Signature(s) Signed: 12/21/2016 5:55:03 PM By: [name] Entered By: [name] on 12/21/2016 08:34:10 Creque, [name] ([id number]) -------------------------------------------------------------------------------- Wound Assessment Details Patient Name: [name] Date of Service: 12/21/2016 8:15 AM Medical Record Number: [id number] Patient Account Number: [id number] Date of Birth/Sex: [birth year] (66 y.o. Male) Treating RN: [name] Primary Care Tammra Pressman: [name] Other Clinician: Referring Arleatha Philipps: [name] Treating Mortimer Bair/Extender: [name], [name] [name]: 26 Wound Status Wound Number: 4 Primary Venous Leg Ulcer Etiology: Wound Location: Right Lower Leg - Medial Wound Status: Healed - Epithelialized Wounding Event: Gradually Appeared Comorbid Arrhythmia, Hypertension, Gout, Date Acquired: 06/02/2016 History: Neuropathy Mark Of [name]: 26 Clustered Wound: No Photos Photo Uploaded By: [name] on 12/21/2016 12:49:17 Wound Measurements Length: (cm) 0 % Reduction in Width: (cm) 0 % Reduction in Depth: (cm) 0 Epithelializat Area: (cm) 0 Tunneling: Volume: (cm) 0 Undermining: Area: 100% Volume: 100% ion: Large (67-100%) No No Wound Description Full Thickness Without Exposed Classification: Support Structures Wound Margin: Flat and Intact Exudate None Present Amount: Foul Odor After Cleansing: No Slough/Fibrino No Wound Bed Granulation Amount: None Present (0%) Necrotic Amount: None Present (0%) Periwound Skin Texture Texture Color Bannan, Wasil ([id number]) No Abnormalities Noted: No No Abnormalities Noted: No Moisture No Abnormalities Noted: No Wound Preparation Ulcer Cleansing: Rinsed/Irrigated with Saline Topical Anesthetic Applied: None Electronic Signature(s) Signed: 12/21/2016 5:55:03 PM By: [name] Entered By: [name] on 12/21/2016  08:32:03 NEEV, MCMAINS ([id number]) -------------------------------------------------------------------------------- Vitals Details Patient Name: KAIMANA, LURZ Date of Service: 12/21/2016 8:15 AM Medical Record Number: [id number] Patient Account Number: [id number] Date of Birth/Sex: October 30, 1950 (66 y.o. Male) Treating RN: [name] Primary Care Marycarmen Hagey: [name] Other Clinician: Referring Iam Lipson: [name] Treating Tillman Kazmierski/Extender: [name], [name] [name]: 26 Vital Signs Time Taken: 08:04 Temperature (F): 97.8 Height (in): 74 Pulse (bpm): 66 Weight (lbs): 252 Respiratory Rate (breaths/min): 18 Body Mass Index (BMI): 32.4 Blood Pressure (mmHg): 161/76 Reference Range: 80 - 120 mg / dl Electronic Signature(s) Signed: 12/21/2016 5:55:03 PM By: [name] Entered By: [name] on 12/21/2016 72:53:66

## 2016-12-24 ENCOUNTER — Ambulatory Visit: Payer: Medicare Other

## 2017-01-18 ENCOUNTER — Encounter (INDEPENDENT_AMBULATORY_CARE_PROVIDER_SITE_OTHER): Payer: Self-pay | Admitting: Vascular Surgery

## 2017-01-18 ENCOUNTER — Ambulatory Visit (INDEPENDENT_AMBULATORY_CARE_PROVIDER_SITE_OTHER): Payer: Medicare Other | Admitting: Vascular Surgery

## 2017-01-18 VITALS — BP 163/88 | HR 64 | Resp 16 | Ht 74.0 in | Wt 246.0 lb

## 2017-01-18 DIAGNOSIS — I83893 Varicose veins of bilateral lower extremities with other complications: Secondary | ICD-10-CM

## 2017-01-18 DIAGNOSIS — L97309 Non-pressure chronic ulcer of unspecified ankle with unspecified severity: Secondary | ICD-10-CM | POA: Diagnosis not present

## 2017-01-18 DIAGNOSIS — I10 Essential (primary) hypertension: Secondary | ICD-10-CM | POA: Diagnosis not present

## 2017-01-18 DIAGNOSIS — I83023 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: Secondary | ICD-10-CM

## 2017-01-18 NOTE — Progress Notes (Signed)
MRN : [id number]  [name] is a 66 y.o. (Oct 22, 1950) male who presents with chief complaint of  Chief Complaint  Patient presents with  . Follow-up    2-22month  .  History of Present Illness: Patient returns today in follow up of venous insufficiency. His right lower Shiley ulcerations have healed. His swelling is reasonably well controlled and he is wearing his compression stockings today. He has no new symptoms or complaints.      Past Medical History:  Diagnosis Date  . Atrial fibrillation ([place])   . Cocaine abuse    + UDS on admission  . Gout current and history of  . History of deep vein thrombosis 2006  . History of tobacco abuse    has smoked for 50 years  . Hypertension   . Pulmonary embolism ([place])   . Stroke [facility])          Past Surgical History:  Procedure Laterality Date  . VASCULAR SURGERY     Stent in leg         Family History  Problem Relation Age of Onset  . Aneurysm Mother   . Diabetes Father   No bleeding disorders, clotting disorders, or autoimmune diseases  Social History      Social History  Substance Use Topics  . Smoking status: Current Every Day Smoker    Packs/day: 0.50    Years: 50.00    Types: Cigarettes  . Smokeless tobacco: Never Used  . Alcohol use No  Previous cocaine use present.  No Known Allergies        Current Outpatient Prescriptions  Medication Sig Dispense Refill  . aspirin 81 MG tablet Take 81 mg by mouth daily.    . cephALEXin (KEFLEX) 500 MG capsule Take 1 capsule (500 mg total) by mouth 3 (three) times daily. 21 capsule 0  . colchicine 0.6 MG tablet Take 0.6 mg by mouth daily as needed (for gout).     [name] dicyclomine (BENTYL) 10 MG capsule Take 1 capsule (10 mg total) by mouth 3 (three) times daily before meals. 20 capsule 0  . furosemide (LASIX) 40 MG tablet     . FUROSEMIDE PO Take 1 tablet by mouth daily.    [name] HYDROcodone-acetaminophen (NORCO)  5-325 MG tablet Take 1 tablet by mouth every 4 (four) hours as needed for moderate pain. 3 tablet 0  . lactulose (CHRONULAC) 10 GM/15ML solution Take 30 mLs (20 g total) by mouth daily as needed for mild constipation. 120 mL 0  . metoprolol tartrate (LOPRESSOR) 25 MG tablet Take 25 mg by mouth 2 (two) times daily.    [name] oxyCODONE-acetaminophen (ROXICET) 5-325 MG tablet Take 1 tablet by mouth every 4 (four) hours as needed for severe pain. 15 tablet 0  . polyethylene glycol (MIRALAX) packet Take 17 g by mouth daily. 14 each 0  . simvastatin (ZOCOR) 20 MG tablet Take 20 mg by mouth daily.    [name] warfarin (COUMADIN) 2 MG tablet Take 2 mg by mouth daily. Take with the 6mg  tablet for a total of 8mg .    . warfarin (COUMADIN) 6 MG tablet Take 6 mg by mouth daily. Every evening at 5 pm. Take with 2mg  tablet for a total of 8mg .     No current facility-administered medications for this visit.       REVIEW OF SYSTEMS(Negative unless checked)  Constitutional: [] Weight loss[] Fever[] Chills Cardiac:[] Chest pain[] Chest pressure[] Palpitations [] Shortness of breath when laying flat [] Shortness of breath at rest [x] Shortness of  breath with exertion. Vascular: [] Pain in legs with walking[] Pain in legsat rest[] Pain in legs when laying flat [] Claudication [] Pain in feet when walking [] Pain in feet at rest [] Pain in feet when laying flat [x] History of DVT [] Phlebitis [x] Swelling in legs [x] Varicose veins [x] Non-healing ulcers Pulmonary: [] Uses home oxygen [] Productive cough[] Hemoptysis [] Wheeze [] COPD [] Asthma Neurologic: [] Dizziness [] Blackouts [] Seizures [] History of stroke [] History of TIA[] Aphasia [] Temporary blindness[] Dysphagia [] Weaknessor numbness in arms [] Weakness or numbnessin legs Musculoskeletal: [] Arthritis [] Joint swelling [] Joint pain [] Low back pain Hematologic:[] Easy bruising[] Easy bleeding  [] Hypercoagulable state [] Anemic [] Hepatitis Gastrointestinal:[] Blood in stool[] Vomiting blood[] Gastroesophageal reflux/heartburn[] Abdominal pain Genitourinary: [] Chronic kidney disease [] Difficulturination [] Frequenturination [] Burning with urination[] Hematuria Skin: [] Rashes [x] Ulcers [x] Wounds Psychological: [] History of anxiety[] History of major depression.   Physical Examination  BP (!) 163/88   Pulse 64   Resp 16   Ht 6\' 2"  (1.88 m)   Wt 111.6 kg (246 lb)   BMI 31.58 kg/m  Gen:  WD/WN, NAD Head: [place]/AT, No temporalis wasting. Ear/Nose/Throat: Hearing grossly intact, nares w/o erythema or drainage, trachea midline Eyes: Conjunctiva clear. Sclera non-icteric Neck: Supple.  No JVD.  Pulmonary:  Good air movement, no use of accessory muscles.  Cardiac: RRR, normal S1, S2 Vascular:  Vessel Right Left  Radial Palpable Palpable                          PT Palpable Palpable  DP Palpable Palpable    Musculoskeletal: M/S 5/5 throughout.  No deformity or atrophy. 1+ BLE edema. Neurologic: Sensation grossly intact in extremities.  Symmetrical.  Speech is fluent.  Psychiatric: Judgment intact, Mood & affect appropriate for pt's clinical situation. Dermatologic: Previous ulceration has healed.      Labs No results found for this or any previous visit (from the past 2160 hour(s)).  Radiology No results found.    Assessment/Plan Diabetes mellitus ([place]) blood glucose control important in reducing the progression of atherosclerotic disease. Also, involved in wound healing. On appropriate medications.  HTN (hypertension), malignant blood pressure control important in reducing the progression of atherosclerotic disease. On appropriate oral medications.  Varicose veins of leg with swelling, bilateral He has undergone successful laser ablation and his wounds have healed. His swelling is reasonably well controlled at this point. He  should continue wearing his compression stockings and elevate his legs. Return to clinic as needed.    Leotis Pain, MD  01/18/2017 4:06 PM    This note was created with Dragon medical transcription system.  Any errors from dictation are purely unintentional

## 2017-01-18 NOTE — Patient Instructions (Signed)
Varicose Vein Surgery, Care After Refer to this sheet in the next few weeks. These instructions provide you with information about caring for yourself after your procedure. Your health care provider may also give you more specific instructions. Your treatment has been planned according to current medical practices, but problems sometimes occur. Call your health care provider if you have any problems or questions after your procedure. What can I expect after the procedure? After your procedure, it is typical to have the following:  Swelling.  Bruising.  Soreness.  Mild skin discoloration.  Slight bleeding at incision sites.  Follow these instructions at home:  Take medicines only as directed by your health care provider.  Wear compression stockings as directed by your health care provider. These stockings help to prevent blood clots and reduce swelling in your legs.  There are many different ways to close and cover an incision, including stitches (sutures), skin glue, and adhesive strips. Follow your health care provider's instructions on: ? Incision care. ? Bandage (dressing) changes and removal. ? Incision closure removal.  Wear loose-fitting clothing.  Get regular daily exercise. Walk or ride a stationary bike daily or as directed by your health care provider.  Ask your health care provider when you can return to work. This may depend on the type of work you do.  Be patient with your recovery. It can take up to 4 weeks to get back to your usual activities. Contact a health care provider if:  You have a fever.  You have drainage, redness, swelling, or pain at an incision site.  You develop a cough. Get help right away if:  You pass out.  You have very bad pain in your leg.  You have leg pain that gets worse when you walk.  You have redness or swelling in your leg that is getting worse.  You have trouble breathing.  You cough up blood. This information is not  intended to replace advice given to you by your health care provider. Make sure you discuss any questions you have with your health care provider. Document Released: 01/04/2014 Document Revised: 10/09/2015 Document Reviewed: 10/10/2013 Elsevier Interactive Patient Education  2018 [employer]  

## 2017-01-18 NOTE — Assessment & Plan Note (Signed)
He has undergone successful laser ablation and his wounds have healed. His swelling is reasonably well controlled at this point. He should continue wearing his compression stockings and elevate his legs. Return to clinic as needed.

## 2017-03-03 ENCOUNTER — Emergency Department: Payer: Medicare Other

## 2017-03-03 ENCOUNTER — Inpatient Hospital Stay
Admission: EM | Admit: 2017-03-03 | Discharge: 2017-03-04 | DRG: 194 | Disposition: A | Discharge status: Home / Self Care | Payer: Medicare Other | Attending: Internal Medicine | Admitting: Internal Medicine

## 2017-03-03 ENCOUNTER — Other Ambulatory Visit: Payer: Self-pay

## 2017-03-03 ENCOUNTER — Encounter: Payer: Self-pay | Admitting: Emergency Medicine

## 2017-03-03 DIAGNOSIS — F1410 Cocaine abuse, uncomplicated: Secondary | ICD-10-CM | POA: Diagnosis not present

## 2017-03-03 DIAGNOSIS — Z7901 Long term (current) use of anticoagulants: Secondary | ICD-10-CM

## 2017-03-03 DIAGNOSIS — J690 Pneumonitis due to inhalation of food and vomit: Secondary | ICD-10-CM | POA: Diagnosis present

## 2017-03-03 DIAGNOSIS — R079 Chest pain, unspecified: Secondary | ICD-10-CM

## 2017-03-03 DIAGNOSIS — I482 Chronic atrial fibrillation: Secondary | ICD-10-CM | POA: Diagnosis not present

## 2017-03-03 DIAGNOSIS — Z86711 Personal history of pulmonary embolism: Secondary | ICD-10-CM

## 2017-03-03 DIAGNOSIS — J440 Chronic obstructive pulmonary disease with acute lower respiratory infection: Secondary | ICD-10-CM | POA: Diagnosis present

## 2017-03-03 DIAGNOSIS — Z6831 Body mass index (BMI) 31.0-31.9, adult: Secondary | ICD-10-CM

## 2017-03-03 DIAGNOSIS — M109 Gout, unspecified: Secondary | ICD-10-CM | POA: Diagnosis not present

## 2017-03-03 DIAGNOSIS — Z86718 Personal history of other venous thrombosis and embolism: Secondary | ICD-10-CM

## 2017-03-03 DIAGNOSIS — Z8673 Personal history of transient ischemic attack (TIA), and cerebral infarction without residual deficits: Secondary | ICD-10-CM | POA: Diagnosis not present

## 2017-03-03 DIAGNOSIS — F17210 Nicotine dependence, cigarettes, uncomplicated: Secondary | ICD-10-CM | POA: Diagnosis not present

## 2017-03-03 DIAGNOSIS — I10 Essential (primary) hypertension: Secondary | ICD-10-CM | POA: Diagnosis present

## 2017-03-03 DIAGNOSIS — J189 Pneumonia, unspecified organism: Principal | ICD-10-CM | POA: Diagnosis present

## 2017-03-03 DIAGNOSIS — Z79899 Other long term (current) drug therapy: Secondary | ICD-10-CM | POA: Diagnosis not present

## 2017-03-03 DIAGNOSIS — Z7982 Long term (current) use of aspirin: Secondary | ICD-10-CM | POA: Diagnosis not present

## 2017-03-03 DIAGNOSIS — E119 Type 2 diabetes mellitus without complications: Secondary | ICD-10-CM | POA: Diagnosis present

## 2017-03-03 LAB — URINALYSIS, COMPLETE (UACMP) WITH MICROSCOPIC
Bacteria, UA: NONE SEEN
Bilirubin Urine: NEGATIVE
GLUCOSE, UA: NEGATIVE mg/dL
Hgb urine dipstick: NEGATIVE
Ketones, ur: NEGATIVE mg/dL
Leukocytes, UA: NEGATIVE
Nitrite: NEGATIVE
Specific Gravity, Urine: >1.046 — ABNORMAL HIGH (ref 1.005–1.030)
pH: 7 (ref 5.0–8.0)

## 2017-03-03 LAB — LACTIC ACID, PLASMA: Lactic Acid, Venous: 0.9 mmol/L (ref 0.5–1.9)

## 2017-03-03 LAB — GLUCOSE, CAPILLARY
GLUCOSE-CAPILLARY: 107 mg/dL — AB (ref 65–99)
Glucose-Capillary: 77 mg/dL (ref 65–99)

## 2017-03-03 LAB — BASIC METABOLIC PANEL
Anion gap: 12 (ref 5–15)
BUN: 11 mg/dL (ref 6–20)
CALCIUM: 8.7 mg/dL — AB (ref 8.9–10.3)
CO2: 26 mmol/L (ref 22–32)
CREATININE: 0.86 mg/dL (ref 0.61–1.24)
Chloride: 100 mmol/L — ABNORMAL LOW (ref 101–111)
GFR calc Af Amer: >60 mL/min (ref >60)
GFR calc non Af Amer: >60 mL/min (ref >60)
GLUCOSE: 111 mg/dL — AB (ref 65–99)
Potassium: 3.6 mmol/L (ref 3.5–5.1)
Sodium: 138 mmol/L (ref 135–145)

## 2017-03-03 LAB — DIFFERENTIAL
BASOS PCT: 0 %
Basophils Absolute: 0 10*3/uL (ref 0–0.1)
EOS ABS: 0 10*3/uL (ref 0–0.7)
EOS PCT: 1 %
Lymphocytes Relative: 15 %
Lymphs Abs: 1.1 10*3/uL (ref 1.0–3.6)
MONO ABS: 0.7 10*3/uL (ref 0.2–1.0)
MONOS PCT: 10 %
NEUTROS ABS: 5.1 10*3/uL (ref 1.4–6.5)
Neutrophils Relative %: 74 %

## 2017-03-03 LAB — URINE DRUG SCREEN, QUALITATIVE (ARMC ONLY)
Amphetamines, Ur Screen: NOT DETECTED
BARBITURATES, UR SCREEN: NOT DETECTED
Benzodiazepine, Ur Scrn: NOT DETECTED
CANNABINOID 50 NG, UR ~~LOC~~: NOT DETECTED
COCAINE METABOLITE, UR ~~LOC~~: NOT DETECTED
MDMA (ECSTASY) UR SCREEN: NOT DETECTED
Methadone Scn, Ur: NOT DETECTED
OPIATE, UR SCREEN: NOT DETECTED
Phencyclidine (PCP) Ur S: NOT DETECTED
Tricyclic, Ur Screen: NOT DETECTED

## 2017-03-03 LAB — CBC
HCT: 39 % — ABNORMAL LOW (ref 40.0–52.0)
Hemoglobin: 12.5 g/dL — ABNORMAL LOW (ref 13.0–18.0)
MCH: 24.5 pg — AB (ref 26.0–34.0)
MCHC: 32.1 g/dL (ref 32.0–36.0)
MCV: 76.4 fL — ABNORMAL LOW (ref 80.0–100.0)
PLATELETS: 147 10*3/uL — AB (ref 150–440)
RBC: 5.11 MIL/uL (ref 4.40–5.90)
RDW: 17 % — ABNORMAL HIGH (ref 11.5–14.5)
WBC: 7.2 10*3/uL (ref 3.8–10.6)

## 2017-03-03 LAB — TROPONIN I: TROPONIN I: 0.03 ng/mL — AB (ref <0.03)

## 2017-03-03 LAB — PROTIME-INR
INR: 1.93
Prothrombin Time: 21.9 seconds — ABNORMAL HIGH (ref 11.4–15.2)

## 2017-03-03 MED ORDER — INSULIN ASPART 100 UNIT/ML ~~LOC~~ SOLN: 0.0000 [IU] | Freq: Three times a day (TID) | SUBCUTANEOUS | Status: DC

## 2017-03-03 MED ORDER — PNEUMOCOCCAL VAC POLYVALENT 25 MCG/0.5ML IJ INJ: 0.5000 mL | INJECTION | INTRAMUSCULAR | Status: DC

## 2017-03-03 MED ORDER — SENNOSIDES-DOCUSATE SODIUM 8.6-50 MG PO TABS
1.0000 | ORAL_TABLET | Freq: Every evening | ORAL | Status: DC | PRN
Start: 2017-03-03 — End: 2017-03-04

## 2017-03-03 MED ORDER — ALBUTEROL SULFATE (2.5 MG/3ML) 0.083% IN NEBU: 2.5000 mg | INHALATION_SOLUTION | RESPIRATORY_TRACT | Status: DC | PRN

## 2017-03-03 MED ORDER — NITROGLYCERIN 2 % TD OINT
1.0000 [in_us] | TOPICAL_OINTMENT | Freq: Four times a day (QID) | TRANSDERMAL | Status: DC
  Administered 2017-03-03 – 2017-03-04 (×3): 1 [in_us] via TOPICAL
  Filled 2017-03-03 (×30): qty 1

## 2017-03-03 MED ORDER — AZITHROMYCIN 500 MG IV SOLR
500.0000 mg | INTRAVENOUS | Status: DC
  Administered 2017-03-04: 500 mg via INTRAVENOUS
  Filled 2017-03-03: qty 500

## 2017-03-03 MED ORDER — FUROSEMIDE 40 MG PO TABS
40.0000 mg | ORAL_TABLET | Freq: Every day | ORAL | Status: DC
  Administered 2017-03-04: 40 mg via ORAL
  Filled 2017-03-03: qty 1

## 2017-03-03 MED ORDER — SIMVASTATIN 20 MG PO TABS
20.0000 mg | ORAL_TABLET | Freq: Every day | ORAL | Status: DC
  Administered 2017-03-04: 20 mg via ORAL
  Filled 2017-03-03: qty 1

## 2017-03-03 MED ORDER — IOPAMIDOL (ISOVUE-370) INJECTION 76%
80.0000 mL | Freq: Once | INTRAVENOUS | Status: AC | PRN
  Administered 2017-03-03: 80 mL via INTRAVENOUS

## 2017-03-03 MED ORDER — DEXTROSE 5 % IV SOLN
1.0000 g | INTRAVENOUS | Status: DC
  Administered 2017-03-04: 1 g via INTRAVENOUS
  Filled 2017-03-03: qty 10

## 2017-03-03 MED ORDER — SODIUM CHLORIDE 0.9% FLUSH
3.0000 mL | Freq: Two times a day (BID) | INTRAVENOUS | Status: DC
  Administered 2017-03-03 – 2017-03-04 (×2): 3 mL via INTRAVENOUS

## 2017-03-03 MED ORDER — ONDANSETRON HCL 4 MG PO TABS: 4.0000 mg | ORAL_TABLET | Freq: Four times a day (QID) | ORAL | Status: DC | PRN

## 2017-03-03 MED ORDER — METOPROLOL TARTRATE 25 MG PO TABS
25.0000 mg | ORAL_TABLET | Freq: Two times a day (BID) | ORAL | Status: DC
  Administered 2017-03-03 – 2017-03-04 (×2): 25 mg via ORAL
  Filled 2017-03-03 (×2): qty 1

## 2017-03-03 MED ORDER — ONDANSETRON HCL 4 MG/2ML IJ SOLN: 4.0000 mg | Freq: Four times a day (QID) | INTRAMUSCULAR | Status: DC | PRN

## 2017-03-03 MED ORDER — ACETAMINOPHEN 650 MG RE SUPP: 650.0000 mg | Freq: Four times a day (QID) | RECTAL | Status: DC | PRN

## 2017-03-03 MED ORDER — NITROGLYCERIN 2 % TD OINT
1.0000 [in_us] | TOPICAL_OINTMENT | Freq: Four times a day (QID) | TRANSDERMAL | Status: DC
Start: 2017-03-03 — End: 2017-03-03
  Administered 2017-03-03: 1 [in_us] via TOPICAL
  Filled 2017-03-03: qty 1

## 2017-03-03 MED ORDER — DEXTROSE 5 % IV SOLN
500.0000 mg | Freq: Once | INTRAVENOUS | Status: AC
  Administered 2017-03-03: 500 mg via INTRAVENOUS
  Filled 2017-03-03: qty 500

## 2017-03-03 MED ORDER — BISACODYL 5 MG PO TBEC: 5.0000 mg | DELAYED_RELEASE_TABLET | Freq: Every day | ORAL | Status: DC | PRN

## 2017-03-03 MED ORDER — HYDROCODONE-ACETAMINOPHEN 5-325 MG PO TABS: 1.0000 | ORAL_TABLET | ORAL | Status: DC | PRN

## 2017-03-03 MED ORDER — ASPIRIN 81 MG PO CHEW
324.0000 mg | CHEWABLE_TABLET | Freq: Once | ORAL | Status: AC
  Administered 2017-03-03: 324 mg via ORAL
  Filled 2017-03-03: qty 4

## 2017-03-03 MED ORDER — INFLUENZA VAC SPLIT HIGH-DOSE 0.5 ML IM SUSY
0.5000 mL | PREFILLED_SYRINGE | INTRAMUSCULAR | Status: DC
  Filled 2017-03-03: qty 0.5

## 2017-03-03 MED ORDER — GUAIFENESIN-DM 100-10 MG/5ML PO SYRP
5.0000 mL | ORAL_SOLUTION | ORAL | Status: DC | PRN
Start: 2017-03-03 — End: 2017-03-04

## 2017-03-03 MED ORDER — COLCHICINE 0.6 MG PO TABS
0.6000 mg | ORAL_TABLET | Freq: Every day | ORAL | Status: DC
  Administered 2017-03-04: 0.6 mg via ORAL
  Filled 2017-03-03: qty 1

## 2017-03-03 MED ORDER — JUVEN PO PACK: 1.0000 | PACK | Freq: Two times a day (BID) | ORAL | Status: DC

## 2017-03-03 MED ORDER — ASPIRIN EC 81 MG PO TBEC
81.0000 mg | DELAYED_RELEASE_TABLET | Freq: Every day | ORAL | Status: DC
  Administered 2017-03-04: 81 mg via ORAL
  Filled 2017-03-03: qty 1

## 2017-03-03 MED ORDER — INSULIN ASPART 100 UNIT/ML ~~LOC~~ SOLN: 0.0000 [IU] | Freq: Every day | SUBCUTANEOUS | Status: DC

## 2017-03-03 MED ORDER — ACETAMINOPHEN 325 MG PO TABS
650.0000 mg | ORAL_TABLET | Freq: Four times a day (QID) | ORAL | Status: DC | PRN
  Administered 2017-03-03: 650 mg via ORAL
  Filled 2017-03-03: qty 2

## 2017-03-03 MED ORDER — KETOROLAC TROMETHAMINE 15 MG/ML IJ SOLN: 15.0000 mg | Freq: Four times a day (QID) | INTRAMUSCULAR | Status: DC | PRN

## 2017-03-03 MED ORDER — CEFTRIAXONE SODIUM IN DEXTROSE 20 MG/ML IV SOLN
1.0000 g | Freq: Once | INTRAVENOUS | Status: AC
  Administered 2017-03-03: 1 g via INTRAVENOUS
  Filled 2017-03-03: qty 50

## 2017-03-03 MED ORDER — SODIUM CHLORIDE 0.9 % IV SOLN: 250.0000 mL | INTRAVENOUS | Status: DC | PRN

## 2017-03-03 MED ORDER — SODIUM CHLORIDE 0.9% FLUSH: 3.0000 mL | INTRAVENOUS | Status: DC | PRN

## 2017-03-03 MED ORDER — ENOXAPARIN SODIUM 40 MG/0.4ML ~~LOC~~ SOLN: 40.0000 mg | SUBCUTANEOUS | Status: DC

## 2017-03-03 NOTE — Progress Notes (Signed)
Rept from [facility] in ED.

## 2017-03-03 NOTE — ED Provider Notes (Signed)
[facility] Emergency Department Provider Note ____________________________________________   I have reviewed the triage vital signs and the triage nursing note.  HISTORY  Chief Complaint Chest Pain   Historian Patient  HPI [name] is a 66 y.o. male presents from home complaining that he woke up with chest pain located at the right-sided chest and associated with sweating and mild shortness breath. He does not know if he has a history of an irregular heartbeat.  In review his past medical history on the chart it indicates cocaine abuse as well as history of DVT, PE, stroke, hypertension as well as cocaine abuse.  Patient states he's also had a mild cough without fevers.  Pain is noted to be about 8 out of 10 at present. Does not feel like GI symptoms.    Past Medical History:  Diagnosis Date  . Atrial fibrillation ([place])   . Cocaine abuse ([place])    + UDS on admission  . Gout current and history of  . History of deep vein thrombosis 2006  . History of tobacco abuse    has smoked for 50 years  . Hypertension   . Pulmonary embolism ([place])   . Stroke [facility])     Patient Active Problem List   Diagnosis Date Noted  . Varicose veins of leg with swelling, bilateral 01/18/2017  . Facial twitching   . CVA (cerebral vascular accident) ([place]) 10/03/2016  . 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 Pneumonia 10/12/2014  . CVA (cerebral infarction) 04/26/2011  . Diabetes mellitus ([place]) 04/26/2011  . 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 Anemia 04/26/2011  . DVT (deep venous thrombosis) ([place]) 04/26/2011    Past Surgical History:  Procedure Laterality Date  . VASCULAR SURGERY     Stent in leg    Prior to Admission medications   Medication Sig Start Date End Date Taking? Authorizing Provider  aspirin 81 MG tablet Take 81 mg by mouth daily.   Yes [provider]  acetaminophen (TYLENOL) 500 MG  tablet Take 500 mg by mouth every 6 (six) hours as needed.    [provider]  colchicine 0.6 MG tablet Take 0.6 mg by mouth daily.     [provider]  furosemide (LASIX) 40 MG tablet Take 40 mg by mouth daily.  08/05/15   [provider]  HYDROcodone-acetaminophen (NORCO) 5-325 MG tablet Take 1 tablet by mouth every 4 (four) hours as needed for moderate pain. Patient not taking: Reported on 10/03/2016 07/18/16   Merlyn Lot, MD  ibuprofen (ADVIL,MOTRIN) 200 MG tablet Take 200 mg by mouth every 6 (six) hours as needed.    [provider]  lactulose (CHRONULAC) 10 GM/15ML solution Take 30 mLs (20 g total) by mouth daily as needed for mild constipation. Patient not taking: Reported on 10/03/2016 05/11/15   Paulette Blanch, MD  metoprolol tartrate (LOPRESSOR) 25 MG tablet Take 25 mg by mouth 2 (two) times daily.    [provider]  nutrition supplement, JUVEN, (JUVEN) PACK Take 1 packet by mouth 2 (two) times daily between meals. 10/05/16   Demetrios Loll, MD  oxyCODONE-acetaminophen (ROXICET) 5-325 MG tablet Take 1 tablet by mouth every 4 (four) hours as needed for severe pain. Patient not taking: Reported on 09/24/2016 09/14/15   Paulette Blanch, MD  polyethylene glycol [facility]) packet Take 17 g by mouth daily. 05/14/15   Loney Hering, MD  simvastatin (ZOCOR) 20 MG tablet Take  20 mg by mouth daily.    [provider]  warfarin (COUMADIN) 6 MG tablet Take 6 mg by mouth daily.     [provider]    No Known Allergies  Family History  Problem Relation Age of Onset  . Aneurysm Mother   . Diabetes Father     Social History Social History  Substance Use Topics  . Smoking status: Current Every Day Smoker    Packs/day: 0.50    Years: 50.00    Types: Cigarettes  . Smokeless tobacco: Never Used  . Alcohol use No    Review of Systems  Constitutional: Negative for fever. Eyes: Negative for visual changes. ENT: Negative for sore  throat. Cardiovascular: positivefor chest pain. Respiratory: positivefor shortness of breath. Gastrointestinal: Negative for abdominal pain, vomiting and diarrhea. Genitourinary: Negative for dysuria. Musculoskeletal: Negative for back pain. Skin: Negative for rash. Neurological: Negative for headache.  ____________________________________________   PHYSICAL EXAM:  VITAL SIGNS: ED Triage Vitals  Enc Vitals Group     BP 03/03/17 0930 (!) 142/70     Pulse Rate 03/03/17 0930 98     Resp 03/03/17 0930 (!) 26     Temp 03/03/17 0930 98.7 F (37.1 C)     Temp Source 03/03/17 0930 Oral     SpO2 03/03/17 0930 99 %     Weight 03/03/17 0930 245 lb (111.1 kg)     Height 03/03/17 0930 6\' 3"  (1.905 m)     Head Circumference --      Peak Flow --      Pain Score 03/03/17 0935 8     Pain Loc --      Pain Edu? --      Excl. in [place]? --      Constitutional: Alert and oriented. patient is diaphoretic and seems a little short of breath. HEENT   Head: Normocephalic and atraumatic.      Eyes: Conjunctivae are normal. Pupils equal and round.       Ears:         Nose: No congestion/rhinnorhea.   Mouth/Throat: Mucous membranes are moist.   Neck: No stridor. Cardiovascular/Chest: Irregularly irregular with normal rate.  No murmurs, rubs, or gallops. Respiratory: Normal respiratory effort without tachypnea nor retractions. Breath sounds are clear and equal bilaterally. No wheezes/rales/rhonchi. Gastrointestinal: Soft. No distention, no guarding, no rebound. Nontender.    Genitourinary/rectal:Deferred Musculoskeletal: Nontender with normal range of motion in all extremities. No joint effusions.  No lower extremity tenderness.  No edema. Neurologic:  Normal speech and language. No gross or focal neurologic deficits are appreciated. Skin:  Skin is warm, dry and intact. No rash noted. Psychiatric: Mood and affect are normal. Speech and behavior are normal. Patient exhibits appropriate  insight and judgment.   ____________________________________________  LABS (pertinent positives/negatives) I, Lisa Roca, MD the attending physician have reviewed the labs noted below.  Labs Reviewed  BASIC METABOLIC PANEL - Abnormal; Notable for the following:       Result Value   Chloride 100 (*)    Glucose, Bld 111 (*)    Calcium 8.7 (*)    All other components within normal limits  CBC - Abnormal; Notable for the following:    Hemoglobin 12.5 (*)    HCT 39.0 (*)    MCV 76.4 (*)    MCH 24.5 (*)    RDW 17.0 (*)    Platelets 147 (*)    All other components within normal limits  TROPONIN I - Abnormal;  Notable for the following:    Troponin I 0.03 (*)    All other components within normal limits  PROTIME-INR - Abnormal; Notable for the following:    Prothrombin Time 21.9 (*)    All other components within normal limits  URINALYSIS, COMPLETE (UACMP) WITH MICROSCOPIC - Abnormal; Notable for the following:    Color, Urine YELLOW (*)    APPearance CLEAR (*)    Specific Gravity, Urine >1.046 (*)    Protein, ur >=300 (*)    Squamous Epithelial / LPF 0-5 (*)    All other components within normal limits  CULTURE, BLOOD (ROUTINE X 2)  CULTURE, BLOOD (ROUTINE X 2)  URINE DRUG SCREEN, QUALITATIVE ([facility] ONLY)  LACTIC ACID, PLASMA  DIFFERENTIAL    ____________________________________________    EKG I, Lisa Roca, MD, the attending physician have personally viewed and interpreted all ECGs.  80 bpm irregularly irregular consistent with atrial fibrillation. Occasional PVC. Left axis deviation. Nonspecific ST and T-wave ____________________________________________  RADIOLOGY All Xrays were viewed by me.  Imaging interpreted by Radiologist, and I, Lisa Roca, MD the attending physician have reviewed the radiologist interpretation noted below.  chest x-ray portable: IMPRESSION: Chronic cardiomegaly and aortic atherosclerosis.  CT chest angio:  IMPRESSION: No evidence  of pulmonary emboli.  Chronic changes in the lungs bilaterally. Some patchy right middle, right lower and left lower lobe infiltrative densities are noted. This most likely represents multifocal pneumonia. Short-term follow-up may be helpful.  Aortic Atherosclerosis (ICD10-I70.0). __________________________________________  PROCEDURES  Procedure(s) performed: None  Critical Care performed: None  ____________________________________________  No current facility-administered medications on file prior to encounter.    Current Outpatient Prescriptions on File Prior to Encounter  Medication Sig Dispense Refill  . aspirin 81 MG tablet Take 81 mg by mouth daily.    [name] acetaminophen (TYLENOL) 500 MG tablet Take 500 mg by mouth every 6 (six) hours as needed.    . colchicine 0.6 MG tablet Take 0.6 mg by mouth daily.     . furosemide (LASIX) 40 MG tablet Take 40 mg by mouth daily.     [name] HYDROcodone-acetaminophen (NORCO) 5-325 MG tablet Take 1 tablet by mouth every 4 (four) hours as needed for moderate pain. (Patient not taking: Reported on 10/03/2016) 3 tablet 0  . ibuprofen (ADVIL,MOTRIN) 200 MG tablet Take 200 mg by mouth every 6 (six) hours as needed.    . lactulose (CHRONULAC) 10 GM/15ML solution Take 30 mLs (20 g total) by mouth daily as needed for mild constipation. (Patient not taking: Reported on 10/03/2016) 120 mL 0  . metoprolol tartrate (LOPRESSOR) 25 MG tablet Take 25 mg by mouth 2 (two) times daily.    . nutrition supplement, JUVEN, (JUVEN) PACK Take 1 packet by mouth 2 (two) times daily between meals. 30 packet 0  . oxyCODONE-acetaminophen (ROXICET) 5-325 MG tablet Take 1 tablet by mouth every 4 (four) hours as needed for severe pain. (Patient not taking: Reported on 09/24/2016) 15 tablet 0  . polyethylene glycol (MIRALAX) packet Take 17 g by mouth daily. 14 each 0  . simvastatin (ZOCOR) 20 MG tablet Take 20 mg by mouth daily.    [name] warfarin (COUMADIN) 6 MG tablet Take 6 mg by mouth  daily.       ____________________________________________  ED COURSE / ASSESSMENT AND PLAN  Pertinent labs & imaging results that were available during my care of the patient were reviewed by me and considered in my medical decision making (see chart for details).   Mr.  Drumright is diaphoretic and has a nonspecific EKG showing A. fib consistent with previous. He's also had PE and DVT before. He was given aspirin as well as nitroglycerin.  His troponin came back minimally elevated at 0.03. His x-ray does not show edema or pneumothorax or pneumonia.  In review of prior troponin, previously troponin is less than 0.03.  His INR is slightly subtherapeutic. I'm going to CT his chest given his history of PE.  after reviewing his chart history, history of prior cocaine abuse, I will add a UDS as well.  CT chest shows no PE, but densities consistent with possible multifocal pneumonia. Patient does report he is having a cough so clinically this does fit. I don't think a septic with no fever, no elevated white blood cell count, no hypotension.  Patient is going to be covered with Rocephin and azithromycin for treatment required pneumonia. I am adding on blood cultures as well as lactate.  From the standpoint of chest pain, patient his having relief now, placed on nitroglycerin paste with persistent hypertension, and getting aspirin for chest pain episode.  UDS was negative for cocaine.  Chest CT showing multifocal pneumonia and although no evidence for sepsis at present, I'm really concerned about this patient's presentation and whether the next 24 hours will be worse or better. I think that he would warrant hospital observation and workup for chest pain episode as well as monitoring for the multifocal pneumonia. O2 sat 91% room air at rest.    DIFFERENTIAL DIAGNOSIS: Differential diagnosis includes, but is not limited to, ACS, aortic dissection, pulmonary embolism, cardiac tamponade, pneumothorax,  pneumonia, pericarditis/myocarditis, GI-related causes including esophagitis/gastritis, and musculoskeletal chest wall pain.    CONSULTATIONS:   Hospitalist, Dr. Bridgett Larsson for admission.   Patient / Family / Caregiver informed of clinical course, medical decision-making process, and agree with plan.   ___________________________________________   FINAL CLINICAL IMPRESSION(S) / ED DIAGNOSES   Final diagnoses:  Nonspecific chest pain  Multifocal pneumonia              Note: This dictation was prepared with Dragon dictation. Any transcriptional errors that result from this process are unintentional    Lisa Roca, MD 03/03/17 1343

## 2017-03-03 NOTE — ED Notes (Signed)
Pt back from CT

## 2017-03-03 NOTE — ED Notes (Addendum)
Pt in CT.

## 2017-03-03 NOTE — ED Notes (Signed)
Pt transferred to hospital room by Mateo Flow, RN via stretcher.

## 2017-03-03 NOTE — Progress Notes (Signed)
Pt admitted from ED without incident per MD order via wheelchair. Pt assisted to bed. Vital signs obtained. Pt oriented to unit protocol. Pt denies pain. Vital signs stable upon admit. Pt denies SOB or CP on admit to the floor. Pt given specimen cup for sputum cup collection with verbal instruction. Pt verb understanding and agrees to comply.

## 2017-03-03 NOTE — H&P (Signed)
[place] [facility] [place] NAME: [name]    MR#:  [id number]  DATE OF BIRTH:  04/18/1951  DATE OF ADMISSION:  03/03/2017  PRIMARY CARE PHYSICIAN: [name], MD   REQUESTING/REFERRING PHYSICIAN: Lisa Roca, MD  CHIEF COMPLAINT:   Chief Complaint  Patient presents with  . Chest Pain   Right-sided chest pain today. HISTORY OF PRESENT ILLNESS:  [name]  is a 66 y.o. male with a known history of hypertension, DM,  A. Fib, cVA,DVT and PE. He patient presents to the ED with above chief complaints. The chest pain is on the right side, 8/10. Intermittent, associated with shortness breath and sweating.He denies any fever or chills.  CXR does not show edema or pneumothorax or pneumonia. Chest CT showing multifocal pneumonia without PE. PAST MEDICAL HISTORY:   Past Medical History:  Diagnosis Date  . Atrial fibrillation ([place])   . Cocaine abuse ([place])    + UDS on admission  . Gout current and history of  . History of deep vein thrombosis 2006  . History of tobacco abuse    has smoked for 50 years  . Hypertension   . Pulmonary embolism ([place])   . Stroke [facility])     PAST SURGICAL HISTORY:   Past Surgical History:  Procedure Laterality Date  . VASCULAR SURGERY     Stent in leg    SOCIAL HISTORY:   Social History  Substance Use Topics  . Smoking status: Current Every Day Smoker    Packs/day: 0.50    Years: 50.00    Types: Cigarettes  . Smokeless tobacco: Never Used  . Alcohol use No    FAMILY HISTORY:   Family History  Problem Relation Age of Onset  . Aneurysm Mother   . Diabetes Father     DRUG ALLERGIES:  No Known Allergies  REVIEW OF SYSTEMS:   Review of Systems  Constitutional: Negative for chills, fever and malaise/fatigue.  HENT: Negative for sore throat.   Eyes: Negative for blurred vision and double vision.  Respiratory: Positive for cough, sputum production and shortness of  breath. Negative for hemoptysis, wheezing and stridor.   Cardiovascular: Positive for chest pain. Negative for palpitations, orthopnea and leg swelling.  Gastrointestinal: Negative for abdominal pain, blood in stool, diarrhea, melena, nausea and vomiting.  Genitourinary: Negative for dysuria, flank pain and hematuria.  Musculoskeletal: Negative for back pain and joint pain.  Skin: Negative for rash.  Neurological: Negative for dizziness, sensory change, focal weakness, seizures, loss of consciousness, weakness and headaches.  Endo/Heme/Allergies: Negative for polydipsia.  Psychiatric/Behavioral: Negative for depression. The patient is not nervous/anxious.     MEDICATIONS AT HOME:   Prior to Admission medications   Medication Sig Start Date End Date Taking? Authorizing Provider  aspirin 81 MG tablet Take 81 mg by mouth daily.   Yes [provider]  furosemide (LASIX) 40 MG tablet Take 40 mg by mouth daily.  08/05/15  Yes [provider]  metoprolol tartrate (LOPRESSOR) 25 MG tablet Take 25 mg by mouth 2 (two) times daily.   Yes [provider]  simvastatin (ZOCOR) 20 MG tablet Take 20 mg by mouth daily.   Yes [provider]  WARFARIN SODIUM PO Take by mouth daily.    Yes [provider]  acetaminophen (TYLENOL) 500 MG tablet Take 500 mg by mouth every 6 (six) hours as needed.    [provider]  colchicine 0.6 MG tablet Take  0.6 mg by mouth daily.     [provider]  HYDROcodone-acetaminophen (NORCO) 5-325 MG tablet Take 1 tablet by mouth every 4 (four) hours as needed for moderate pain. Patient not taking: Reported on 10/03/2016 07/18/16   Merlyn Lot, MD  ibuprofen (ADVIL,MOTRIN) 200 MG tablet Take 200 mg by mouth every 6 (six) hours as needed.    [provider]  lactulose (CHRONULAC) 10 GM/15ML solution Take 30 mLs (20 g total) by mouth daily as needed for mild constipation. Patient not taking: Reported on  10/03/2016 05/11/15   Paulette Blanch, MD  nutrition supplement, Fanny Dance, Fanny Dance) PACK Take 1 packet by mouth 2 (two) times daily between meals. 10/05/16   Demetrios Loll, MD  oxyCODONE-acetaminophen (ROXICET) 5-325 MG tablet Take 1 tablet by mouth every 4 (four) hours as needed for severe pain. Patient not taking: Reported on 09/24/2016 09/14/15   Paulette Blanch, MD  polyethylene glycol [facility]) packet Take 17 g by mouth daily. Patient not taking: Reported on 03/03/2017 05/14/15   Loney Hering, MD      VITAL SIGNS:  Blood pressure (!) 146/85, pulse 64, temperature 98.7 F (37.1 C), temperature source Oral, resp. rate (!) 25, height 6\' 3"  (1.905 m), weight 245 lb (111.1 kg), SpO2 98 %.  PHYSICAL EXAMINATION:  Physical Exam  GENERAL:  66 y.o.-year-old patient lying in the bed with no acute distress. Morbid obesity. EYES: Pupils equal, round, reactive to light and accommodation. No scleral icterus. Extraocular muscles intact.  HEENT: Head atraumatic, normocephalic. Oropharynx and nasopharynx clear.  NECK:  Supple, no jugular venous distention. No thyroid enlargement, no tenderness.  LUNGS: Normal breath sounds bilaterally, no wheezing, rales,rhonchi, but has some crackles. No use of accessory muscles of respiration.  CARDIOVASCULAR: S1, S2 normal. No murmurs, rubs, or gallops.  ABDOMEN: Soft, nontender, nondistended. Bowel sounds present. No organomegaly or mass.  EXTREMITIES: No pedal edema, cyanosis, or clubbing.  NEUROLOGIC: Cranial nerves II through XII are intact. Muscle strength 5/5 in all extremities. Sensation intact. Gait not checked.  PSYCHIATRIC: The patient is alert and oriented x 3.  SKIN: No obvious rash, lesion, or ulcer.   LABORATORY PANEL:   CBC  Recent Labs Lab 03/03/17 0930  WBC 7.2  HGB 12.5*  HCT 39.0*  PLT 147*   ------------------------------------------------------------------------------------------------------------------  Chemistries   Recent Labs Lab  03/03/17 0930  NA 138  K 3.6  CL 100*  CO2 26  GLUCOSE 111*  BUN 11  CREATININE 0.86  CALCIUM 8.7*   ------------------------------------------------------------------------------------------------------------------  Cardiac Enzymes  Recent Labs Lab 03/03/17 0930  TROPONINI 0.03*   ------------------------------------------------------------------------------------------------------------------  RADIOLOGY:  Ct Angio Chest Pe W/cm &/or Wo Cm  Result Date: 03/03/2017 CLINICAL DATA:  Right-sided chest pain for 2 days, history of deep venous thrombosis EXAM: CT ANGIOGRAPHY CHEST WITH CONTRAST TECHNIQUE: Multidetector CT imaging of the chest was performed using the standard protocol during bolus administration of intravenous contrast. Multiplanar CT image reconstructions and MIPs were obtained to evaluate the vascular anatomy. CONTRAST:  80 mL Isovue 370. COMPARISON:  Chest x-ray from earlier in the same day, 10/12/2014 FINDINGS: Cardiovascular: Mild atherosclerotic changes of the thoracic aorta are noted. Coronary calcifications and mild cardiac enlargement is noted. The pulmonary artery shows a normal branching pattern. No filling defects to suggest pulmonary emboli are identified. Mediastinum/Nodes: The esophagus is within normal limits. The thoracic inlet is unremarkable. Scattered small mediastinal lymph nodes are seen stable from the prior exam. Few scattered hilar lymph nodes are noted particularly on  the right also stable from the previous exam. Lungs/Pleura: Diffuse emphysematous changes are noted. Patchy areas of scarring are again seen in the apices. Mild right middle and right lower lobe infiltrative changes are seen. Some left lower lobe sub solid density is noted as well. Upper Abdomen: Visualized upper abdomen is within normal limits. Musculoskeletal: Degenerative changes of the thoracolumbar spine are noted. No definitive rib abnormality is seen. Review of the MIP images confirms  the above findings. IMPRESSION: No evidence of pulmonary emboli. Chronic changes in the lungs bilaterally. Some patchy right middle, right lower and left lower lobe infiltrative densities are noted. This most likely represents multifocal pneumonia. Short-term follow-up may be helpful. Aortic Atherosclerosis (ICD10-I70.0). Electronically Signed   By: Inez Catalina M.D.   On: 03/03/2017 11:24   Dg Chest Port 1 View  Result Date: 03/03/2017 CLINICAL DATA:  Chest pain, hypertension and atrial fibrillation. EXAM: PORTABLE CHEST 1 VIEW COMPARISON:  06/16/2016 FINDINGS: Chronically enlarged cardiac silhouette. Aortic atherosclerosis with calcification and tortuosity. Mild chronic lung markings. No evidence of infiltrate, collapse or effusion. No frank edema. No significant bone finding. IMPRESSION: Chronic cardiomegaly and aortic atherosclerosis. Electronically Signed   By: Nelson Chimes M.D.   On: 03/03/2017 09:58      IMPRESSION AND PLAN:   Pneumonia, CPAP. The patient will be admitted to medical floor. Continue Zithromax and Rocephin, follow-up cultures. Robitussin when necessary. Nebulizer when necessary. History of chronic A. Fib. Continue Coumadin and pharmacy to dose. Hypertension. Continue home hypertension medication. Diabetes. Start sliding scale. Morbid obesity. Tobacco abuse. Smoking cessation was counseled for 3-4 minutes.  All the records are reviewed and case discussed with ED provider. Management plans discussed with the patient, family and they are in agreement.  CODE STATUS: full code  TOTAL TIME TAKING CARE OF THIS PATIENT: 57 minutes.    Demetrios Loll M.D on 03/03/2017 at 3:58 PM  Between 7am to 6pm - Pager - [phone]  After 6pm go to [email or web address] - Proofreader  [employer] [facility] [place] [facility]  Office  [phone]  CC: Primary care physician; Theotis Burrow, MD   Note: This dictation was prepared with Dragon dictation along with smaller  phrase technology. Any transcriptional errors that result from this process are unin

## 2017-03-03 NOTE — ED Notes (Signed)
Pt attempting to urinate.

## 2017-03-03 NOTE — ED Triage Notes (Signed)
Pt c/o right sided sharp chest pain that is constant since yesterday.  Is on coumadin for DVT hx, has been taking per report.  Labored breathing in triage. Diaphoretic.

## 2017-03-03 NOTE — Progress Notes (Addendum)
ANTICOAGULATION CONSULT NOTE - Initial Consult  Pharmacy Consult for Warfarin dosing and monitoring   Indication: atrial fibrillation, pulmonary embolus and DVT  No Known Allergies  Patient Measurements: Height: 6\' 3"  (190.5 cm) Weight: 245 lb (111.1 kg) IBW/kg (Calculated) : 84.5  Vital Signs: Temp: 98.7 F (37.1 C) (10/18 0930) Temp Source: Oral (10/18 0930) BP: 146/85 (10/18 1500) Pulse Rate: 64 (10/18 1500)  Labs:  Recent Labs  03/03/17 0930  HGB 12.5*  HCT 39.0*  PLT 147*  LABPROT 21.9*  INR 1.93  CREATININE 0.86  TROPONINI 0.03*    Estimated Creatinine Clearance: 110.6 mL/min (by C-G formula based on SCr of 0.86 mg/dL).   Medical History: Past Medical History:  Diagnosis Date  . Atrial fibrillation ([place])   . Cocaine abuse ([place])    + UDS on admission  . Gout current and history of  . History of deep vein thrombosis 2006  . History of tobacco abuse    has smoked for 50 years  . Hypertension   . Pulmonary embolism ([place])   . Stroke [facility])     Assessment: 66 yo male admitted with PNA. Pharmacy consulted for warfarin dosing and monitoring. Patient has a PMH of DVT/PE and A. Fib. According to Care Everywhere patient's last warfarin dose was 6mg  daily. Patient does not know his warfarin dose, and his pharmacy currently has prescriptions for 6mg  and 1mg  tablets.   DATE INR DOSE 10/18 1.93 6mg ?  (PTA)  Goal of Therapy:  INR 2-3 Monitor platelets by anticoagulation protocol: Yes   Plan:  INR on admission is slightly subtherapeutic. Patient is receiving Ceftriaxone and Azithromycin for PNA, might see an increase in INR due to Abx.   Patient reports already taking warfarin dose this morning. Will not give any warfarin tonight. Recheck  INR with am labs and daily while on Abx. Plan to order warfarin to be given in the AM after INR results.   Enoxaparin ordered for DVT prophylaxis- INR is almost therapeutic and will discontinue the enoxaparin at this time     [name] Dupre, PharmD, BCPS Clinical Pharmacist 03/03/2017 3:50 PM

## 2017-03-03 NOTE — Progress Notes (Signed)
Patient complaining of a headache; MD notified orders received.

## 2017-03-03 NOTE — ED Notes (Addendum)
Report to Beth, RN

## 2017-03-04 DIAGNOSIS — J189 Pneumonia, unspecified organism: Secondary | ICD-10-CM | POA: Diagnosis not present

## 2017-03-04 DIAGNOSIS — R079 Chest pain, unspecified: Secondary | ICD-10-CM | POA: Diagnosis not present

## 2017-03-04 LAB — PROTIME-INR
INR: 2.06
PROTHROMBIN TIME: 23 s — AB (ref 11.4–15.2)

## 2017-03-04 LAB — STREP PNEUMONIAE URINARY ANTIGEN: STREP PNEUMO URINARY ANTIGEN: NEGATIVE

## 2017-03-04 LAB — GLUCOSE, CAPILLARY: Glucose-Capillary: 96 mg/dL (ref 65–99)

## 2017-03-04 MED ORDER — AZITHROMYCIN 250 MG PO TABS: ORAL_TABLET | ORAL | 0 refills | Status: DC

## 2017-03-04 MED ORDER — CEFUROXIME AXETIL 500 MG PO TABS: 500.0000 mg | ORAL_TABLET | Freq: Two times a day (BID) | ORAL | Status: DC

## 2017-03-04 MED ORDER — GUAIFENESIN-DM 100-10 MG/5ML PO SYRP: 5.0000 mL | ORAL_SOLUTION | ORAL | 0 refills | Status: DC | PRN

## 2017-03-04 MED ORDER — AZITHROMYCIN 250 MG PO TABS: 250.0000 mg | ORAL_TABLET | Freq: Every day | ORAL | Status: DC

## 2017-03-04 MED ORDER — CEFUROXIME AXETIL 500 MG PO TABS: 500.0000 mg | ORAL_TABLET | Freq: Two times a day (BID) | ORAL | 0 refills | Status: DC

## 2017-03-04 NOTE — Discharge Instructions (Signed)
Stop smoking  Information on my medicine - Coumadin   (Warfarin)  This medication education was reviewed with me or my healthcare representative as part of my discharge preparation.  The pharmacist that spoke with me during my hospital stay was:  Ramond Dial, [facility]  Why was Coumadin prescribed for you? Coumadin was prescribed for you because you have a blood clot or a medical condition that can cause an increased risk of forming blood clots. Blood clots can cause serious health problems by blocking the flow of blood to the heart, lung, or brain. Coumadin can prevent harmful blood clots from forming. As a reminder your indication for Coumadin is:   Stroke Prevention Because Of Atrial Fibrillation  What test will check on my response to Coumadin? While on Coumadin (warfarin) you will need to have an INR test regularly to ensure that your dose is keeping you in the desired range. The INR (international normalized ratio) number is calculated from the result of the laboratory test called prothrombin time (PT).  If an INR APPOINTMENT HAS NOT ALREADY BEEN MADE FOR YOU please schedule an appointment to have this lab work done by your health care provider within 7 days. Your INR goal is usually a number between:  2 to 3 or your provider may give you a more narrow range like 2-2.5.  Ask your health care provider during an office visit what your goal INR is.  What  do you need to  know  About  COUMADIN? Take Coumadin (warfarin) exactly as prescribed by your healthcare provider about the same time each day.  DO NOT stop taking without talking to the doctor who prescribed the medication.  Stopping without other blood clot prevention medication to take the place of Coumadin may increase your risk of developing a new clot or stroke.  Get refills before you run out.  What do you do if you miss a dose? If you miss a dose, take it as soon as you remember on the same day then continue your regularly scheduled  regimen the next day.  Do not take two doses of Coumadin at the same time.  Important Safety Information A possible side effect of Coumadin (Warfarin) is an increased risk of bleeding. You should call your healthcare provider right away if you experience any of the following: ? Bleeding from an injury or your nose that does not stop. ? Unusual colored urine (red or dark brown) or unusual colored stools (red or black). ? Unusual bruising for unknown reasons. ? A serious fall or if you hit your head (even if there is no bleeding).  Some foods or medicines interact with Coumadin (warfarin) and might alter your response to warfarin. To help avoid this: ? Eat a balanced diet, maintaining a consistent amount of Vitamin K. ? Notify your provider about major diet changes you plan to make. ? Avoid alcohol or limit your intake to 1 drink for women and 2 drinks for men per day. (1 drink is 5 oz. wine, 12 oz. beer, or 1.5 oz. liquor.)  Make sure that ANY health care provider who prescribes medication for you knows that you are taking Coumadin (warfarin).  Also make sure the healthcare provider who is monitoring your Coumadin knows when you have started a new medication including herbals and non-prescription products.  Coumadin (Warfarin)  Major Drug Interactions  Increased Warfarin Effect Decreased Warfarin Effect  Alcohol (large quantities) Antibiotics (esp. Septra/Bactrim, Flagyl, Cipro) Amiodarone (Cordarone) Aspirin (ASA) Cimetidine (Tagamet) Megestrol (  Megace) NSAIDs (ibuprofen, naproxen, etc.) Piroxicam (Feldene) Propafenone (Rythmol SR) Propranolol (Inderal) Isoniazid (INH) Posaconazole (Noxafil) Barbiturates (Phenobarbital) Carbamazepine (Tegretol) Chlordiazepoxide (Librium) Cholestyramine (Questran) Griseofulvin Oral Contraceptives Rifampin Sucralfate (Carafate) Vitamin K   Coumadin (Warfarin) Major Herbal Interactions  Increased Warfarin Effect Decreased Warfarin Effect    Garlic Ginseng Ginkgo biloba Coenzyme Q10 Green tea St. Johns wort    Coumadin (Warfarin) FOOD Interactions  Eat a consistent number of servings per week of foods HIGH in Vitamin K (1 serving =  cup)  Collards (cooked, or boiled & drained) Kale (cooked, or boiled & drained) Mustard greens (cooked, or boiled & drained) Parsley *serving size only =  cup Spinach (cooked, or boiled & drained) Swiss chard (cooked, or boiled & drained) Turnip greens (cooked, or boiled & drained)  Eat a consistent number of servings per week of foods MEDIUM-HIGH in Vitamin K (1 serving = 1 cup)  Asparagus (cooked, or boiled & drained) Broccoli (cooked, boiled & drained, or raw & chopped) Brussel sprouts (cooked, or boiled & drained) *serving size only =  cup Lettuce, raw (green leaf, endive, romaine) Spinach, raw Turnip greens, raw & chopped   These websites have more information on Coumadin (warfarin):  FailFactory.se; VeganReport.com.au;

## 2017-03-04 NOTE — Progress Notes (Signed)
Patient was discharged home with family. Removed IVs with caths intact. Reviewed oral ABX use, and last does of meds given. Tele removed. Allowed time for questions.

## 2017-03-04 NOTE — Discharge Summary (Signed)
[place] [facility] [place] NAME: [name]    MR#:  [id number]  DATE OF BIRTH:  1951/05/15  DATE OF ADMISSION:  03/03/2017 ADMITTING PHYSICIAN: Demetrios Loll, MD  DATE OF DISCHARGE: 03/04/17  PRIMARY CARE PHYSICIAN: Theotis Burrow, MD    ADMISSION DIAGNOSIS:  Chest pain [R07.9] Nonspecific chest pain [R07.9] Multifocal pneumonia [J18.9]  DISCHARGE DIAGNOSIS:  Bilateral Pneumonia COPD SECONDARY DIAGNOSIS:   Past Medical History:  Diagnosis Date  . Atrial fibrillation ([place])   . Cocaine abuse ([place])    + UDS on admission  . Gout current and history of  . History of deep vein thrombosis 2006  . History of tobacco abuse    has smoked for 50 years  . Hypertension   . Pulmonary embolism ([place])   . Stroke [facility])     HOSPITAL COURSE:   [name]  is a 66 y.o. male with a known history of hypertension, DM,  A. Fib, cVA,DVT and PE. He patient presents to the ED with cough and right sided CP associated with shortness breath and sweating.He denies any fever or chills. CXR does not show edema or pneumothorax or pneumonia  * Pneumonia, CPAP. Afebrile, LA 0.9, WBC 7.2 -Continue Zithromax and Rocephin--change to oral abxs - follow-up blood cultures negative - Robitussin when necessary. Nebulizer when necessary. -sats >92% on RA  *History of chronic A. Fib. Continue Coumadin and pharmacy to dose.  *Hypertension. Continue home hypertension medication.  *Diabetes. Start sliding scale.  *Morbid obesity.  *Tobacco abuse. Smoking cessation was counseled for 3-4 minutes.  Overall better Ok to go home. Pt uses cane to walk. No falls Wife in the room CONSULTS OBTAINED:    DRUG ALLERGIES:  No Known Allergies  DISCHARGE MEDICATIONS:   Current Discharge Medication List    START taking these medications   Details  azithromycin (ZITHROMAX) 250 MG tablet Take daily as indicated Qty: 6 each, Refills: 0     cefUROXime (CEFTIN) 500 MG tablet Take 1 tablet (500 mg total) by mouth 2 (two) times daily with a meal. Qty: 12 tablet, Refills: 0    guaiFENesin-dextromethorphan (ROBITUSSIN DM) 100-10 MG/5ML syrup Take 5 mLs by mouth every 4 (four) hours as needed for cough. Qty: 118 mL, Refills: 0      CONTINUE these medications which have NOT CHANGED   Details  aspirin 81 MG tablet Take 81 mg by mouth daily.    furosemide (LASIX) 40 MG tablet Take 40 mg by mouth daily.     metoprolol tartrate (LOPRESSOR) 25 MG tablet Take 25 mg by mouth 2 (two) times daily.    simvastatin (ZOCOR) 20 MG tablet Take 20 mg by mouth daily.    WARFARIN SODIUM PO Take by mouth daily.     acetaminophen (TYLENOL) 500 MG tablet Take 500 mg by mouth every 6 (six) hours as needed.    colchicine 0.6 MG tablet Take 0.6 mg by mouth daily.     HYDROcodone-acetaminophen (NORCO) 5-325 MG tablet Take 1 tablet by mouth every 4 (four) hours as needed for moderate pain. Qty: 3 tablet, Refills: 0    ibuprofen (ADVIL,MOTRIN) 200 MG tablet Take 200 mg by mouth every 6 (six) hours as needed.    lactulose (CHRONULAC) 10 GM/15ML solution Take 30 mLs (20 g total) by mouth daily as needed for mild constipation. Qty: 120 mL, Refills: 0    nutrition supplement, JUVEN, (JUVEN) PACK Take 1 packet by mouth 2 (two) times daily  between meals. Qty: 30 packet, Refills: 0    polyethylene glycol (MIRALAX) packet Take 17 g by mouth daily. Qty: 14 each, Refills: 0      STOP taking these medications     oxyCODONE-acetaminophen (ROXICET) 5-325 MG tablet         If you experience worsening of your admission symptoms, develop shortness of breath, life threatening emergency, suicidal or homicidal thoughts you must seek medical attention immediately by calling 911 or calling your MD immediately  if symptoms less severe.  You Must read complete instructions/literature along with all the possible adverse reactions/side effects for all the  Medicines you take and that have been prescribed to you. Take any new Medicines after you have completely understood and accept all the possible adverse reactions/side effects.   Please note  You were cared for by a hospitalist during your hospital stay. If you have any questions about your discharge medications or the care you received while you were in the hospital after you are discharged, you can call the unit and asked to speak with the hospitalist on call if the hospitalist that took care of you is not available. Once you are discharged, your primary care physician will handle any further medical issues. Please note that NO REFILLS for any discharge medications will be authorized once you are discharged, as it is imperative that you return to your primary care physician (or establish a relationship with a primary care physician if you do not have one) for your aftercare needs so that they can reassess your need for medications and monitor your lab values. Today   SUBJECTIVE   Coughing up clear phlegm  VITAL SIGNS:  Blood pressure (!) 147/80, pulse 72, temperature 98 F (36.7 C), temperature source Oral, resp. rate 18, height 6\' 2"  (1.88 m), weight 111.1 kg (245 lb), SpO2 100 %.  I/O:   Intake/Output Summary (Last 24 hours) at 03/04/17 1041 Last data filed at 03/04/17 0900  Gross per 24 hour  Intake              660 ml  Output              600 ml  Net               60 ml    PHYSICAL EXAMINATION:  GENERAL:  66 y.o.-year-old patient lying in the bed with no acute distress. obese EYES: Pupils equal, round, reactive to light and accommodation. No scleral icterus. Extraocular muscles intact.  HEENT: Head atraumatic, normocephalic. Oropharynx and nasopharynx clear.  NECK:  Supple, no jugular venous distention. No thyroid enlargement, no tenderness.  LUNGS: Normal breath sounds bilaterally, no wheezing, rales,rhonchi or crepitation. No use of accessory muscles of respiration.   CARDIOVASCULAR: S1, S2 normal. No murmurs, rubs, or gallops.  ABDOMEN: Soft, non-tender, non-distended. Bowel sounds present. No organomegaly or mass.  EXTREMITIES: No pedal edema, cyanosis, or clubbing.  NEUROLOGIC: Cranial nerves II through XII are intact. Muscle strength 5/5 in all extremities. Sensation intact. Gait not checked.  PSYCHIATRIC: The patient is alert and oriented x 3.  SKIN: No obvious rash, lesion, or ulcer.   DATA REVIEW:   CBC   Recent Labs Lab 03/03/17 0930  WBC 7.2  HGB 12.5*  HCT 39.0*  PLT 147*    Chemistries   Recent Labs Lab 03/03/17 0930  NA 138  K 3.6  CL 100*  CO2 26  GLUCOSE 111*  BUN 11  CREATININE 0.86  CALCIUM 8.7*  Microbiology Results   Recent Results (from the past 240 hour(s))  Culture, blood (routine x 2)     Status: None (Preliminary result)   Collection Time: 03/03/17 11:53 AM  Result Value Ref Range Status   Specimen Description BLOOD BROWN AND CLOUDY  Final   Special Requests BOTTLES DRAWN AEROBIC AND ANAEROBIC LEFT FORE ARM  Final   Culture NO GROWTH < 24 HOURS  Final   Report Status PENDING  Incomplete  Culture, blood (routine x 2)     Status: None (Preliminary result)   Collection Time: 03/03/17 11:53 AM  Result Value Ref Range Status   Specimen Description BLOOD BROWN AND CLOUDY  Final   Special Requests BOTTLES DRAWN AEROBIC AND ANAEROBIC LEFT ARM  Final   Culture NO GROWTH < 24 HOURS  Final   Report Status PENDING  Incomplete    RADIOLOGY:  Ct Angio Chest Pe W/cm &/or Wo Cm  Result Date: 03/03/2017 CLINICAL DATA:  Right-sided chest pain for 2 days, history of deep venous thrombosis EXAM: CT ANGIOGRAPHY CHEST WITH CONTRAST TECHNIQUE: Multidetector CT imaging of the chest was performed using the standard protocol during bolus administration of intravenous contrast. Multiplanar CT image reconstructions and MIPs were obtained to evaluate the vascular anatomy. CONTRAST:  80 mL Isovue 370. COMPARISON:  Chest x-ray  from earlier in the same day, 10/12/2014 FINDINGS: Cardiovascular: Mild atherosclerotic changes of the thoracic aorta are noted. Coronary calcifications and mild cardiac enlargement is noted. The pulmonary artery shows a normal branching pattern. No filling defects to suggest pulmonary emboli are identified. Mediastinum/Nodes: The esophagus is within normal limits. The thoracic inlet is unremarkable. Scattered small mediastinal lymph nodes are seen stable from the prior exam. Few scattered hilar lymph nodes are noted particularly on the right also stable from the previous exam. Lungs/Pleura: Diffuse emphysematous changes are noted. Patchy areas of scarring are again seen in the apices. Mild right middle and right lower lobe infiltrative changes are seen. Some left lower lobe sub solid density is noted as well. Upper Abdomen: Visualized upper abdomen is within normal limits. Musculoskeletal: Degenerative changes of the thoracolumbar spine are noted. No definitive rib abnormality is seen. Review of the MIP images confirms the above findings. IMPRESSION: No evidence of pulmonary emboli. Chronic changes in the lungs bilaterally. Some patchy right middle, right lower and left lower lobe infiltrative densities are noted. This most likely represents multifocal pneumonia. Short-term follow-up may be helpful. Aortic Atherosclerosis (ICD10-I70.0). Electronically Signed   By: Inez Catalina M.D.   On: 03/03/2017 11:24   Dg Chest Port 1 View  Result Date: 03/03/2017 CLINICAL DATA:  Chest pain, hypertension and atrial fibrillation. EXAM: PORTABLE CHEST 1 VIEW COMPARISON:  06/16/2016 FINDINGS: Chronically enlarged cardiac silhouette. Aortic atherosclerosis with calcification and tortuosity. Mild chronic lung markings. No evidence of infiltrate, collapse or effusion. No frank edema. No significant bone finding. IMPRESSION: Chronic cardiomegaly and aortic atherosclerosis. Electronically Signed   By: Nelson Chimes M.D.   On:  03/03/2017 09:58     Management plans discussed with the patient, family and they are in agreement.  CODE STATUS:     Code Status Orders        Start     Ordered   03/03/17 1542  Full code  Continuous     03/03/17 1541    Code Status History    Date Active Date Inactive Code Status Order ID Comments User Context   10/03/2016  3:35 PM 10/05/2016  3:35 PM Full Code [id number]  [name], MD Inpatient   10/12/2014  9:53 AM 10/14/2014  3:14 PM Full Code [id number]  Gladstone Lighter, MD Inpatient      TOTAL TIME TAKING CARE OF THIS PATIENT: *40* minutes.    [name] M.D on 03/04/2017 at 10:41 AM  Between 7am to 6pm - Pager - [phone] After 6pm go to [email or web address] - password [email or web address] [facility]  Office  [phone]  CC: Primary care physician; Theotis Burrow, MD

## 2017-03-05 LAB — HIV ANTIBODY (ROUTINE TESTING W REFLEX): HIV SCREEN 4TH GENERATION: NONREACTIVE

## 2017-03-08 LAB — CULTURE, BLOOD (ROUTINE X 2)
CULTURE: NO GROWTH
Culture: NO GROWTH

## 2017-04-26 ENCOUNTER — Encounter: Payer: Medicare Other | Attending: Internal Medicine | Admitting: Internal Medicine

## 2017-04-26 ENCOUNTER — Ambulatory Visit: Payer: Medicare Other | Admitting: Podiatry

## 2017-04-26 DIAGNOSIS — Z86718 Personal history of other venous thrombosis and embolism: Secondary | ICD-10-CM | POA: Insufficient documentation

## 2017-04-26 DIAGNOSIS — M109 Gout, unspecified: Secondary | ICD-10-CM | POA: Insufficient documentation

## 2017-04-26 DIAGNOSIS — E11622 Type 2 diabetes mellitus with other skin ulcer: Secondary | ICD-10-CM | POA: Insufficient documentation

## 2017-04-26 DIAGNOSIS — Z8673 Personal history of transient ischemic attack (TIA), and cerebral infarction without residual deficits: Secondary | ICD-10-CM | POA: Insufficient documentation

## 2017-04-26 DIAGNOSIS — J449 Chronic obstructive pulmonary disease, unspecified: Secondary | ICD-10-CM | POA: Insufficient documentation

## 2017-04-26 DIAGNOSIS — G629 Polyneuropathy, unspecified: Secondary | ICD-10-CM | POA: Diagnosis not present

## 2017-04-26 DIAGNOSIS — I87331 Chronic venous hypertension (idiopathic) with ulcer and inflammation of right lower extremity: Secondary | ICD-10-CM | POA: Insufficient documentation

## 2017-04-26 DIAGNOSIS — L97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: Secondary | ICD-10-CM | POA: Insufficient documentation

## 2017-04-26 DIAGNOSIS — L97513 Non-pressure chronic ulcer of other part of right foot with necrosis of muscle: Secondary | ICD-10-CM | POA: Insufficient documentation

## 2017-04-26 DIAGNOSIS — E1151 Type 2 diabetes mellitus with diabetic peripheral angiopathy without gangrene: Secondary | ICD-10-CM | POA: Insufficient documentation

## 2017-04-29 NOTE — Progress Notes (Signed)
JABARRI, [name] ([id number]) Visit Report for 04/26/2017 Allergy List Details Patient Name: [name], [name] Date of Service: 04/26/2017 12:30 PM Medical Record Number: [id number] Patient Account Number: [id number] Date of Birth/Sex: [birth year] (66 y.o. Male) Treating RN: Roger Shelter Primary Care Emmalyne Giacomo: Royetta Crochet Other Clinician: Cornell Barman Referring Adhira Jamil: Royetta Crochet Treating Isma Tietje/Extender: Ricard Dillon Weeks in Treatment: 0 Allergies Active Allergies No Known Allergies Allergy Notes Electronic Signature(s) Signed: 04/26/2017 2:07:56 PM By: Roger Shelter Entered By: Roger Shelter on 04/26/2017 12:41:32 Whitney, [name] ([id number]) -------------------------------------------------------------------------------- Arrival Information Details Patient Name: [name] Date of Service: 04/26/2017 12:30 PM Medical Record Number: [id number] Patient Account Number: [id number] Date of Birth/Sex: [birth year] (66 y.o. Male) Treating RN: Roger Shelter Primary Care Manna Gose: Royetta Crochet Other Clinician: Cornell Barman Referring Annalaya Wile: Royetta Crochet Treating Lakeith Careaga/Extender: Tito Dine in Treatment: 0 Visit Information Patient Arrived: Ambulatory Arrival Time: 12:33 Accompanied By: self Transfer Assistance: None Patient Identification Verified: Yes Secondary Verification Process Yes Completed: Patient Requires Transmission-Based No Precautions: Patient Has Alerts: Yes Patient Alerts: Patient on Blood Thinner Coumadin 81mg  Aspirin DM II o History Since Last Visit Signs or symptoms of abuse/neglect since last visito No Hospitalized since last visit: No Electronic Signature(s) Signed: 04/26/2017 2:07:56 PM By: Roger Shelter Entered By: Roger Shelter on 04/26/2017 12:36:51 Ram, [name] ([id number]) -------------------------------------------------------------------------------- Clinic Level of Care  Assessment Details Patient Name: [name] Date of Service: 04/26/2017 12:30 PM Medical Record Number: [id number] Patient Account Number: [id number] Date of Birth/Sex: 1950-07-04 (66 y.o. Male) Treating RN: Roger Shelter Primary Care Tavis Kring: Royetta Crochet Other Clinician: Cornell Barman Referring Lavontay Kirk: Royetta Crochet Treating Melvena Vink/Extender: Ricard Dillon Weeks in Treatment: 0 Clinic Level of Care Assessment Items TOOL 1 Quantity Score X - Use when EandM and Procedure is performed on INITIAL visit 1 0 ASSESSMENTS - Nursing Assessment / Reassessment X - General Physical Exam (combine w/ comprehensive assessment (listed just below) when 1 20 performed on new pt. evals) X- 1 25 Comprehensive Assessment (HX, ROS, Risk Assessments, Wounds Hx, etc.) ASSESSMENTS - Wound and Skin Assessment / Reassessment []  - Dermatologic / Skin Assessment (not related to wound area) 0 ASSESSMENTS - Ostomy and/or Continence Assessment and Care []  - Incontinence Assessment and Management 0 []  - 0 Ostomy Care Assessment and Management (repouching, etc.) PROCESS - Coordination of Care X - Simple Patient / Family Education for ongoing care 1 15 []  - 0 Complex (extensive) Patient / Family Education for ongoing care X- 1 10 Staff obtains Programmer, systems, Records, Test Results / Process Orders []  - 0 Staff telephones HHA, Nursing Homes / Clarify orders / etc []  - 0 Routine Transfer to another Facility (non-emergent condition) []  - 0 Routine Hospital Admission (non-emergent condition) []  - 0 New Admissions / Biomedical engineer / Ordering NPWT, Apligraf, etc. []  - 0 Emergency Hospital Admission (emergent condition) PROCESS - Special Needs []  - Pediatric / Minor Patient Management 0 []  - 0 Isolation Patient Management []  - 0 Hearing / Language / Visual special needs []  - 0 Assessment of Community assistance (transportation, D/C planning, etc.) []  - 0 Additional assistance /  Altered mentation []  - 0 Support Surface(s) Assessment (bed, cushion, seat, etc.) Cedeno, Warwick ([id number]) INTERVENTIONS - Miscellaneous []  - External ear exam 0 []  - 0 Patient Transfer (multiple staff / Civil Service fast streamer / Similar devices) []  - 0 Simple Staple / Suture removal (25 or less) []  - 0 Complex Staple / Suture removal (26 or more) []  - 0 Hypo/Hyperglycemic Management (do not check if billed separately) []  -  0 Ankle / Brachial Index (ABI) - do not check if billed separately Has the patient been seen at the hospital within the last three years: Yes Total Score: 70 Level Of Care: New/Established - Level 2 Electronic Signature(s) Signed: 04/26/2017 2:07:56 PM By: Roger Shelter Entered By: Roger Shelter on 04/26/2017 13:38:22 Picklesimer, [name] ([id number]) -------------------------------------------------------------------------------- Encounter Discharge Information Details Patient Name: [name] Date of Service: 04/26/2017 12:30 PM Medical Record Number: [id number] Patient Account Number: [id number] Date of Birth/Sex: 01-07-1951 (66 y.o. Male) Treating RN: Roger Shelter Primary Care Tanveer Dobberstein: Royetta Crochet Other Clinician: Cornell Barman Referring Hudsen Fei: Royetta Crochet Treating Neziah Vogelgesang/Extender: Tito Dine in Treatment: 0 Encounter Discharge Information Items Discharge Pain Level: 0 Discharge Condition: Stable Ambulatory Status: Ambulatory Discharge Destination: Home Transportation: Other Accompanied By: self Schedule Follow-up Appointment: Yes Medication Reconciliation completed and No provided to Patient/Care Taysen Bushart: Patient Clinical Summary of Care: Declined Electronic Signature(s) Signed: 04/26/2017 2:07:56 PM By: Roger Shelter Entered By: Roger Shelter on 04/26/2017 13:40:38 Atwood, [name] ([id number]) -------------------------------------------------------------------------------- Lower Extremity  Assessment Details Patient Name: [name] Date of Service: 04/26/2017 12:30 PM Medical Record Number: [id number] Patient Account Number: [id number] Date of Birth/Sex: 05-07-1951 (66 y.o. Male) Treating RN: Roger Shelter Primary Care Raylea Adcox: Royetta Crochet Other Clinician: Cornell Barman Referring Melven Stockard: Royetta Crochet Treating Ashe Graybeal/Extender: Ricard Dillon Weeks in Treatment: 0 Edema Assessment Assessed: [Left: No] [Right: No] Edema: [Left: Ye] [Right: s] Calf Left: Right: Point of Measurement: 40 cm From Medial Instep cm 44.2 cm Ankle Left: Right: Point of Measurement: 13 cm From Medial Instep cm 30.4 cm Vascular Assessment Claudication: Claudication Assessment [Right:None] Pulses: Dorsalis Pedis Palpable: [Right:Yes] Posterior Tibial Extremity colors, hair growth, and conditions: Extremity Color: [Right:Normal] Hair Growth on Extremity: [Right:No] Temperature of Extremity: [Right:Cool] Capillary Refill: [Right:> 3 seconds] Toe Nail Assessment Left: Right: Thick: Yes Discolored: Yes Deformed: Yes Improper Length and Hygiene: Yes Electronic Signature(s) Signed: 04/26/2017 2:07:56 PM By: Roger Shelter Entered By: Roger Shelter on 04/26/2017 13:06:42 Leather, [name] ([id number]) -------------------------------------------------------------------------------- Multi Wound Chart Details Patient Name: [name] Date of Service: 04/26/2017 12:30 PM Medical Record Number: [id number] Patient Account Number: [id number] Date of Birth/Sex: 1950-07-09 (66 y.o. Male) Treating RN: Roger Shelter Primary Care Dymond Spreen: Royetta Crochet Other Clinician: Cornell Barman Referring Shaeley Segall: Royetta Crochet Treating Jai Steil/Extender: Ricard Dillon Weeks in Treatment: 0 Vital Signs Height(in): 74 Pulse(bpm): 22 Weight(lbs): 252 Blood Pressure(mmHg): 160/99 Body Mass Index(BMI): 32 Temperature(F): 97.8 Respiratory  Rate 16 (breaths/min): Photos: [6:No Photos] [7:No Photos] [N/A:N/A] Wound Location: [6:Right Lower Leg - Anterior, Proximal] [7:Right Lower Leg - Anterior, Distal] [N/A:N/A] Wounding Event: [6:Gradually Appeared] [7:Gradually Appeared] [N/A:N/A] 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] [7:Diabetic Wound/Ulcer of the Lower Extremity] [N/A:N/A] Comorbid History: [6:Arrhythmia, Hypertension, Type II Diabetes, Gout, Neuropathy] [7:Arrhythmia, Hypertension, Type II Diabetes, Gout, Neuropathy] [N/A:N/A] Date Acquired: [6:04/19/2017] [7:04/12/2017] [N/A:N/A] Weeks of Treatment: [6:0] [7:0] [N/A:N/A] Wound Status: [6:Open] [7:Open] [N/A:N/A] Measurements L x W x D [6:0.9x0.7x0.2] [7:0.3x0.3x0.2] [N/A:N/A] (cm) Area (cm) : [6:0.495] [7:0.071] [N/A:N/A] Volume (cm) : [6:0.099] [7:0.014] [N/A:N/A] Classification: [6:Grade 1] [7:Grade 1] [N/A:N/A] Exudate Amount: [6:Medium] [7:Small] [N/A:N/A] Exudate Type: [6:Serous] [7:Serous] [N/A:N/A] Exudate Color: [6:amber] [7:amber] [N/A:N/A] Wound Margin: [6:Distinct, outline attached] [7:Distinct, outline attached] [N/A:N/A] Granulation Amount: [6:Medium (34-66%)] [7:Medium (34-66%)] [N/A:N/A] Granulation Quality: [6:Pink] [7:Pink] [N/A:N/A] Necrotic Amount: [6:Medium (34-66%)] [7:Medium (34-66%)] [N/A:N/A] Exposed Structures: [6:Fat Layer (Subcutaneous Tissue) Exposed: Yes Fascia: No Tendon: No Muscle: No Joint: No Bone: No] [7:Fat Layer (Subcutaneous Tissue) Exposed: Yes Fascia: No Tendon: No Muscle: No Joint: No Bone: No] [N/A:N/A]  Epithelialization: [6:None] [7:None] [N/A:N/A] Debridement: [6:Debridement ([id number])] [7:Debridement ([id number])] [N/A:N/A] Pre-procedure [6:01:02] [7:01:02] [N/A:N/A] Verification/Time Out Taken: Pain Control: [6:Other] [7:Other] [N/A:N/A] Tissue Debrided: [N/A:N/A] Fibrin/Slough, Skin, Fibrin/Slough, Skin, Subcutaneous Subcutaneous Level: Skin/Subcutaneous Tissue Skin/Subcutaneous  Tissue N/A Debridement Area (sq cm): 0.63 0.09 N/A Instrument: Curette Curette N/A Bleeding: Minimum Minimum N/A Hemostasis Achieved: Pressure Pressure N/A Procedural Pain: 0 0 N/A Post Procedural Pain: 0 0 N/A Debridement Treatment Procedure was tolerated well Procedure was tolerated well N/A Response: Post Debridement 0.9x0.7x0.2 0.3x0.3x0.2 N/A Measurements L x W x D (cm) Post Debridement Volume: 0.099 0.014 N/A (cm) Periwound Skin Texture: Induration: Yes Induration: Yes N/A Scarring: Yes Excoriation: No Excoriation: No Callus: No Callus: No Crepitus: No Crepitus: No Rash: No Rash: No Scarring: No Periwound Skin Moisture: Maceration: No Maceration: No N/A Dry/Scaly: No Dry/Scaly: No Periwound Skin Color: Atrophie Blanche: No Atrophie Blanche: No N/A Cyanosis: No Cyanosis: No Ecchymosis: No Ecchymosis: No Erythema: No Erythema: No Hemosiderin Staining: No Hemosiderin Staining: No Mottled: No Mottled: No Pallor: No Pallor: No Rubor: No Rubor: No Temperature: No Abnormality No Abnormality N/A Tenderness on Palpation: No No N/A Wound Preparation: Ulcer Cleansing: Ulcer Cleansing: N/A Rinsed/Irrigated with Saline Rinsed/Irrigated with Saline Topical Anesthetic Applied: Topical Anesthetic Applied: Other: lidocaine 4% Other: lidocaine 4% Procedures Performed: Debridement Debridement N/A Treatment Notes Wound #6 (Right, Proximal, Anterior Lower Leg) 1. Cleansed with: Clean wound with Normal Saline 2. Anesthetic Topical Lidocaine 4% cream to wound bed prior to debridement 3. Peri-wound Care: Other peri-wound care (specify in notes) 4. Dressing Applied: Hydrogel Prisma Ag 5. Secondary Dressing Applied ABD Pad 7. Secured with 3 Layer Compression System - Right Lower Extremity Camuso, Tylek ([id number]) Notes TCA cream to surrounding areas of wounds Wound #7 (Right, Distal, Anterior Lower Leg) 1. Cleansed with: Clean wound with Normal  Saline 2. Anesthetic Topical Lidocaine 4% cream to wound bed prior to debridement 3. Peri-wound Care: Other peri-wound care (specify in notes) 4. Dressing Applied: Hydrogel Prisma Ag 5. Secondary Dressing Applied ABD Pad 7. Secured with 3 Layer Compression System - Right Lower Extremity Notes TCA cream to surrounding areas of wounds Electronic Signature(s) Signed: 04/26/2017 2:07:56 PM By: Roger Shelter Entered By: Roger Shelter on 04/26/2017 13:43:19 Bidwell, [name] ([id number]) -------------------------------------------------------------------------------- Multi-Disciplinary Care Plan Details Patient Name: [name] Date of Service: 04/26/2017 12:30 PM Medical Record Number: [id number] Patient Account Number: [id number] Date of Birth/Sex: 09-Sep-1950 (66 y.o. Male) Treating RN: Roger Shelter Primary Care Ryleigh Esqueda: Royetta Crochet Other Clinician: Cornell Barman Referring Dung Salinger: Royetta Crochet Treating Kanita Delage/Extender: Tito Dine in Treatment: 0 Active Inactive ` Orientation to the Wound Care Program Nursing Diagnoses: Knowledge deficit related to the wound healing center program Goals: Patient/caregiver will verbalize understanding of the [facility] Program Date Initiated: 04/26/2017 Target Resolution Date: 05/27/2017 Goal Status: Active Interventions: Provide education on orientation to the wound center Notes: ` Wound/Skin Impairment Nursing Diagnoses: Impaired tissue integrity Knowledge deficit related to ulceration/compromised skin integrity Goals: Patient/caregiver will verbalize understanding of skin care regimen Date Initiated: 04/26/2017 Target Resolution Date: 05/27/2017 Goal Status: Active Ulcer/skin breakdown will have a volume reduction of 30% by week 4 Date Initiated: 04/26/2017 Target Resolution Date: 05/27/2017 Goal Status: Active Interventions: Assess ulceration(s) every visit Provide education on ulcer  and skin care Treatment Activities: Skin care regimen initiated : 04/26/2017 Notes: Electronic Signature(s) Signed: 04/26/2017 2:07:56 PM By: [name], [name] ([id number]) Entered By: Roger Shelter on 04/26/2017 13:42:55 Eberlein, [name] -------------------------------------------------------------------------------- Pain Assessment Details Patient Name: [name] Date of Service: 04/26/2017  12:30 PM Medical Record Number: [id number] Patient Account Number: [id number] Date of Birth/Sex: 09-Feb-1951 (66 y.o. Male) Treating RN: Roger Shelter Primary Care Shawniece Oyola: Royetta Crochet Other Clinician: Cornell Barman Referring Emsley Custer: Royetta Crochet Treating Yoanna Jurczyk/Extender: Tito Dine in Treatment: 0 Active Problems Location of Pain Severity and Description of Pain Patient Has Paino Yes Site Locations Pain Location: Pain in Ulcers With Dressing Change: Yes Duration of the Pain. Constant / Intermittento Intermittent Rate the pain. Current Pain Level: 5 Worst Pain Level: 5 Character of Pain Describe the Pain: Sharp, Shooting Pain Management and Medication Current Pain Management: Goals for Pain Management Topical or injectable lidocaine is offered to patient for acute pain when surgical debridement is performed. If needed, Patient is instructed to use over the counter pain medication for the following 24-48 hours after debridement. Wound care MDs do not prescribed pain medications. Patient has chronic pain or uncontrolled pain. Patient has been instructed to make an appointment with their Primary Care Physician for pain management. Electronic Signature(s) Signed: 04/26/2017 2:07:56 PM By: Roger Shelter Entered By: Roger Shelter on 04/26/2017 12:37:49 Capwell, [name] ([id number]) -------------------------------------------------------------------------------- Patient/Caregiver Education Details Patient Name:  [name] Date of Service: 04/26/2017 12:30 PM Medical Record Number: [id number] Patient Account Number: [id number] Date of Birth/Gender: 10-22-1950 (66 y.o. Male) Treating RN: Roger Shelter Primary Care Physician: Royetta Crochet Other Clinician: Cornell Barman Referring Physician: Royetta Crochet Treating Physician/Extender: Tito Dine in Treatment: 0 Education Assessment Education Provided To: Patient Education Topics Provided Welcome To [facility]: Handouts: Welcome To [facility] Methods: Explain/Verbal Responses: State content correctly Wound Debridement: Handouts: Wound Debridement Methods: Explain/Verbal Responses: State content correctly Wound/Skin Impairment: Handouts: Caring for Your Ulcer Methods: Explain/Verbal Responses: State content correctly Electronic Signature(s) Signed: 04/26/2017 2:07:56 PM By: Roger Shelter Entered By: Roger Shelter on 04/26/2017 13:41:16 [place], [name] ([id number]) -------------------------------------------------------------------------------- Wound Assessment Details Patient Name: [name] Date of Service: 04/26/2017 12:30 PM Medical Record Number: [id number] Patient Account Number: [id number] Date of Birth/Sex: 23-May-1950 (66 y.o. Male) Treating RN: Roger Shelter Primary Care Tania Steinhauser: Royetta Crochet Other Clinician: Cornell Barman Referring Elody Kleinsasser: Royetta Crochet Treating Vu Liebman/Extender: Ricard Dillon Weeks in Treatment: 0 Wound Status Wound Number: 6 Primary Diabetic Wound/Ulcer of the Lower Extremity Etiology: Wound Location: Right Lower Leg - Anterior, Proximal Wound Open Wounding Event: Gradually Appeared Status: Date Acquired: 04/12/2017 Comorbid Chronic Obstructive Pulmonary Disease Weeks Of Treatment: 0 History: (COPD), Arrhythmia, Deep Vein Thrombosis, Clustered Wound: No Hypertension, Type II Diabetes, Gout, Neuropathy Photos Wound  Measurements Length: (cm) 0.9 Width: (cm) 0.7 Depth: (cm) 0.2 Area: (cm) 0.495 Volume: (cm) 0.099 % Reduction in Area: 0% % Reduction in Volume: 0% Epithelialization: None Tunneling: No Undermining: No Wound Description Classification: Grade 1 Wound Margin: Distinct, outline attached Exudate Amount: Medium Exudate Type: Serous Exudate Color: amber Foul Odor After Cleansing: No Slough/Fibrino Yes Wound Bed Granulation Amount: Medium (34-66%) Exposed Structure Granulation Quality: Pink Fascia Exposed: No Necrotic Amount: Medium (34-66%) Fat Layer (Subcutaneous Tissue) Exposed: Yes Necrotic Quality: Adherent Slough Tendon Exposed: No Muscle Exposed: No Joint Exposed: No Bone Exposed: No Periwound Skin Texture Neu, Trevaun ([id number]) Texture Color No Abnormalities Noted: No No Abnormalities Noted: No Callus: No Atrophie Blanche: No Crepitus: No Cyanosis: No Excoriation: No Ecchymosis: No Induration: Yes Erythema: No Rash: No Hemosiderin Staining: No Scarring: Yes Mottled: No Pallor: No Moisture Rubor: No No Abnormalities Noted: No Dry / Scaly: No Temperature / Pain Maceration: No Temperature: No Abnormality Wound Preparation Ulcer Cleansing: Rinsed/Irrigated with Saline Topical  Anesthetic Applied: Other: lidocaine 4%, Treatment Notes Wound #6 (Right, Proximal, Anterior Lower Leg) 1. Cleansed with: Clean wound with Normal Saline 2. Anesthetic Topical Lidocaine 4% cream to wound bed prior to debridement 3. Peri-wound Care: Other peri-wound care (specify in notes) 4. Dressing Applied: Hydrogel Prisma Ag 5. Secondary Dressing Applied ABD Pad 7. Secured with 3 Layer Compression System - Right Lower Extremity Notes TCA cream to surrounding areas of wounds Electronic Signature(s) Signed: 04/26/2017 2:07:20 PM By: Roger Shelter Previous Signature: 04/26/2017 12:55:08 PM Version By: Roger Shelter Entered By: Roger Shelter on  04/26/2017 14:07:20 [place]ton, [name] ([id number]) -------------------------------------------------------------------------------- Wound Assessment Details Patient Name: [name] Date of Service: 04/26/2017 12:30 PM Medical Record Number: [id number] Patient Account Number: [id number] Date of Birth/Sex: [birth year] (66 y.o. Male) Treating RN: Roger Shelter Primary Care Shafiq Larch: Royetta Crochet Other Clinician: Cornell Barman Referring Garnett Rekowski: Royetta Crochet Treating Roanne Haye/Extender: Ricard Dillon Weeks in Treatment: 0 Wound Status Wound Number: 7 Primary Diabetic Wound/Ulcer of the Lower Extremity Etiology: Wound Location: Right Lower Leg - Anterior, Distal Wound Open Wounding Event: Gradually Appeared Status: Date Acquired: 04/12/2017 Comorbid Chronic Obstructive Pulmonary Disease Weeks Of Treatment: 0 History: (COPD), Arrhythmia, Deep Vein Thrombosis, Clustered Wound: No Hypertension, Type II Diabetes, Gout, Neuropathy Photos Wound Measurements Length: (cm) 0.3 Width: (cm) 0.3 Depth: (cm) 0.2 Area: (cm) 0.071 Volume: (cm) 0.014 % Reduction in Area: 0% % Reduction in Volume: 0% Epithelialization: None Tunneling: No Undermining: No Wound Description Classification: Grade 1 Wound Margin: Distinct, outline attached Exudate Amount: Small Exudate Type: Serous Exudate Color: amber Foul Odor After Cleansing: No Slough/Fibrino Yes Wound Bed Granulation Amount: Medium (34-66%) Exposed Structure Granulation Quality: Pink Fascia Exposed: No Necrotic Amount: Medium (34-66%) Fat Layer (Subcutaneous Tissue) Exposed: Yes Necrotic Quality: Adherent Slough Tendon Exposed: No Muscle Exposed: No Joint Exposed: No Bone Exposed: No Periwound Skin Texture Mino, Audy ([id number]) Texture Color No Abnormalities Noted: No No Abnormalities Noted: No Callus: No Atrophie Blanche: No Crepitus: No Cyanosis: No Excoriation: No Ecchymosis:  No Induration: Yes Erythema: No Rash: No Hemosiderin Staining: No Scarring: No Mottled: No Pallor: No Moisture Rubor: No No Abnormalities Noted: No Dry / Scaly: No Temperature / Pain Maceration: No Temperature: No Abnormality Wound Preparation Ulcer Cleansing: Rinsed/Irrigated with Saline Topical Anesthetic Applied: Other: lidocaine 4%, Treatment Notes Wound #7 (Right, Distal, Anterior Lower Leg) 1. Cleansed with: Clean wound with Normal Saline 2. Anesthetic Topical Lidocaine 4% cream to wound bed prior to debridement 3. Peri-wound Care: Other peri-wound care (specify in notes) 4. Dressing Applied: Hydrogel Prisma Ag 5. Secondary Dressing Applied ABD Pad 7. Secured with 3 Layer Compression System - Right Lower Extremity Notes TCA cream to surrounding areas of wounds Electronic Signature(s) Signed: 04/26/2017 2:07:37 PM By: Roger Shelter Previous Signature: 04/26/2017 12:57:42 PM Version By: Roger Shelter Entered By: Roger Shelter on 04/26/2017 14:07:36 Girtman, [name] ([id number]) -------------------------------------------------------------------------------- Vitals Details Patient Name: [name] Date of Service: 04/26/2017 12:30 PM Medical Record Number: [id number] Patient Account Number: [id number] Date of Birth/Sex: 1951-01-15 (66 y.o. Male) Treating RN: Roger Shelter Primary Care Micheal Murad: Royetta Crochet Other Clinician: Cornell Barman Referring Tanith Dagostino: Royetta Crochet Treating Salina Stanfield/Extender: Tito Dine in Treatment: 0 Vital Signs Time Taken: 12:37 Temperature (F): 97.8 Height (in): 74 Pulse (bpm): 61 Weight (lbs): 252 Respiratory Rate (breaths/min): 16 Body Mass Index (BMI): 32.4 Blood Pressure (mmHg): 160/99 Reference Range: 80 - 120 mg / dl Notes Patient states he took his blood pressure medicine this morning at 7:30am. Electronic Signature(s) Signed: 04/26/2017 2:07:56 PM By: Claudina Lick,  [name] Entered By:  Roger Shelter on 04/26/2017 12:40:25

## 2017-04-29 NOTE — Progress Notes (Signed)
ATHOL, BOLDS ([id number]) Visit Report for 04/26/2017 Abuse/Suicide Risk Screen Details Patient Name: [name], [name] Date of Service: 04/26/2017 12:30 PM Medical Record Number: [id number] Patient Account Number: [id number] Date of Birth/Sex: Oct 30, 1950 (66 y.o. Male) Treating RN: Roger Shelter Primary Care Love Chowning: Royetta Crochet Other Clinician: Cornell Barman Referring Angelamarie Avakian: Royetta Crochet Treating Alnisa Hasley/Extender: Ricard Dillon Weeks in Treatment: 0 Abuse/Suicide Risk Screen Items Answer ABUSE/SUICIDE RISK SCREEN: Has anyone close to you tried to hurt or harm you recentlyo No Do you feel uncomfortable with anyone in your familyo No Has anyone forced you do things that you didnot want to doo No Do you have any thoughts of harming yourselfo No Patient displays signs or symptoms of abuse and/or neglect. No Electronic Signature(s) Signed: 04/26/2017 2:07:56 PM By: Roger Shelter Entered By: Roger Shelter on 04/26/2017 12:48:02 Vangorden, [name] ([id number]) -------------------------------------------------------------------------------- Activities of Daily Living Details Patient Name: [name] Date of Service: 04/26/2017 12:30 PM Medical Record Number: [id number] Patient Account Number: [id number] Date of Birth/Sex: 1950/08/17 (66 y.o. Male) Treating RN: Roger Shelter Primary Care Araiya Tilmon: Royetta Crochet Other Clinician: Cornell Barman Referring Yeilyn Gent: Royetta Crochet Treating Hinata Diener/Extender: Tito Dine in Treatment: 0 Activities of Daily Living Items Answer Activities of Daily Living (Please select one for each item) Drive Automobile Completely Able Take Medications Completely Able Use Telephone Completely Able Care for Appearance Completely Able Use Toilet Completely Able Bath / Shower Completely Able Dress Self Completely Able Feed Self Completely Able Walk Completely Able Get In / Out Bed Completely Able Housework  Completely Able Prepare Meals Completely [place] for Self Completely Able Electronic Signature(s) Signed: 04/26/2017 2:07:56 PM By: Roger Shelter Entered By: Roger Shelter on 04/26/2017 12:48:14 Pumphrey, [name] ([id number]) -------------------------------------------------------------------------------- Education Assessment Details Patient Name: [name] Date of Service: 04/26/2017 12:30 PM Medical Record Number: [id number] Patient Account Number: [id number] Date of Birth/Sex: 08-15-1950 (66 y.o. Male) Treating RN: Roger Shelter Primary Care Odelle Kosier: Royetta Crochet Other Clinician: Cornell Barman Referring Daimion Adamcik: Royetta Crochet Treating Lanis Storlie/Extender: Tito Dine in Treatment: 0 Learning Preferences/Education Level/Primary Language Learning Preference: Explanation, Demonstration Highest Education Level: High School Preferred Language: English Cognitive Barrier Assessment/Beliefs Language Barrier: No Translator Needed: No Memory Deficit: No Emotional Barrier: No Cultural/Religious Beliefs Affecting Medical Care: No Physical Barrier Assessment Impaired Vision: No Impaired Hearing: No Decreased Hand dexterity: No Knowledge/Comprehension Assessment Knowledge Level: Low Comprehension Level: Low Ability to understand written Low instructions: Ability to understand verbal Low instructions: Motivation Assessment Anxiety Level: Calm Cooperation: Cooperative Education Importance: Acknowledges Need Interest in Health Problems: Asks Questions Perception: Coherent Willingness to Engage in Self- High Management Activities: Readiness to Engage in Self- High Management Activities: Electronic Signature(s) Signed: 04/26/2017 2:07:56 PM By: Roger Shelter Entered By: Roger Shelter on 04/26/2017 12:48:52 Golay, [name]  ([id number]) -------------------------------------------------------------------------------- Fall Risk Assessment Details Patient Name: [name] Date of Service: 04/26/2017 12:30 PM Medical Record Number: [id number] Patient Account Number: [id number] Date of Birth/Sex: 1951/04/03 (66 y.o. Male) Treating RN: Roger Shelter Primary Care Jachin Coury: Royetta Crochet Other Clinician: Cornell Barman Referring Kevonte Vanecek: Royetta Crochet Treating Takita Riecke/Extender: Tito Dine in Treatment: 0 Fall Risk Assessment Items Have you had 2 or more falls in the last 12 monthso 0 No Have you had any fall that resulted in injury in the last 12 monthso 0 No FALL RISK ASSESSMENT: History of falling - immediate or within 3 months 0 No Secondary diagnosis 0 No Ambulatory aid None/bed rest/wheelchair/nurse 0 Yes Crutches/cane/walker 0 No Furniture 0 No IV Access/Saline Lock  0 No Gait/Training Normal/bed rest/immobile 0 Yes Weak 0 No Impaired 0 No Mental Status Oriented to own ability 0 Yes Electronic Signature(s) Signed: 04/26/2017 2:07:56 PM By: Roger Shelter Entered By: Roger Shelter on 04/26/2017 12:49:20 Lambe, [name] ([id number]) -------------------------------------------------------------------------------- Nutrition Risk Assessment Details Patient Name: [name] Date of Service: 04/26/2017 12:30 PM Medical Record Number: [id number] Patient Account Number: [id number] Date of Birth/Sex: 1950-09-09 (66 y.o. Male) Treating RN: Roger Shelter Primary Care Kellsie Grindle: Royetta Crochet Other Clinician: Cornell Barman Referring Chandelle Harkey: Royetta Crochet Treating Torie Priebe/Extender: Ricard Dillon Weeks in Treatment: 0 Height (in): 74 Weight (lbs): 252 Body Mass Index (BMI): 32.4 Nutrition Risk Assessment Items NUTRITION RISK SCREEN: I have an illness or condition that made me change the kind and/or amount of 0 No food I eat I eat fewer than two meals per day  0 No I eat few fruits and vegetables, or milk products 0 No I have three or more drinks of beer, liquor or wine almost every day 0 No I have tooth or mouth problems that make it hard for me to eat 0 No I don't always have enough money to buy the food I need 0 No I eat alone most of the time 0 No I take three or more different prescribed or over-the-counter drugs a day 0 No Without wanting to, I have lost or gained 10 pounds in the last six months 0 No I am not always physically able to shop, cook and/or feed myself 0 No Nutrition Protocols Good Risk Protocol 0 No interventions needed Moderate Risk Protocol Electronic Signature(s) Signed: 04/26/2017 2:07:56 PM By: Roger Shelter Entered By: Roger Shelter on 04/26/2017 12:49:35

## 2017-04-29 NOTE — Progress Notes (Addendum)
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 Immunological The patient has no complaints or symptoms. Integumentary (Skin) Complains or has symptoms of Wounds, Bleeding or bruising tendency. Denies complaints or symptoms of Breakdown. Musculoskeletal The patient has no complaints or symptoms. Hardenbrook, [name] ([id number]) Neurologic The patient has no complaints or symptoms. Oncologic The patient has no complaints or symptoms. Objective Constitutional Patient is hypertensive.. Pulse regular and within target range for patient.[name] Respirations regular, non-labored and within target range.. Temperature is normal and within the target range for the patient.[name] appears in no distress. Vitals Time Taken: 12:37 PM, Height: 74 in, Weight: 252 lbs, BMI: 32.4, Temperature: 97.8 F, Pulse: 61 bpm, Respiratory Rate: 16 breaths/min, Blood Pressure: 160/99 mmHg. General Notes: Patient states he took his blood pressure medicine this morning at 7:30am. Eyes Conjunctivae clear. No discharge. Respiratory Respiratory effort  is easy and symmetric bilaterally. Rate is normal at rest and on room air.. Bilateral breath sounds are clear and equal in all lobes with no wheezes, rales or rhonchi.. Cardiovascular Heart rhythm and rate regular, without murmur or gallop.. Femoral pulses palpable on the right although certainly not vibrant. Dorsalis pedis pulse on the right is palpable but not the posterior tibial. There is some edema in the right leg but not excessive. Lymphatic None palpable in the right popliteal or inguinal area. Psychiatric No evidence of depression, anxiety, or agitation. Calm, cooperative, and communicative. Appropriate interactions and affect.. General Notes: Wound exam; the patient has 2 small punched out wounds. Firstly on the calf and then on the medial right foot below the medial malleolus. Both of these are in roughly the same condition small punched out areas. The proximal one is in the same position as his previous  wound. The surface of both wounds was debrided of necrotic debris over the wound surface also proximally we removed skin and subcutaneous tissue from the circumference of the wound. There is no evidence of infection in either area. Integumentary (Hair, Skin) Skin and subcutaneous tissue without rashes, lesions, discoloration or ulcers. No evidence of fungal disease. Hair and skin color texture normal and healthy in appearance.. Wound #6 status is Open. Original cause of wound was Gradually Appeared. The wound is located on the Right,Proximal,Anterior Lower Leg. The wound measures 0.9cm length x 0.7cm width x 0.2cm depth; 0.495cm^2 area and 0.099cm^3 volume. There is Fat Layer (Subcutaneous Tissue) Exposed exposed. There is no tunneling or undermining noted. There is a medium amount of serous drainage noted. The wound margin is distinct with the outline attached to the wound base. There is medium (34-66%) pink granulation within the wound bed. There is a medium (34-66%) amount of necrotic tissue within the wound bed including Adherent Slough. The periwound skin appearance exhibited: Induration, Scarring. The periwound skin appearance did not exhibit: Callus, Crepitus, Excoriation, Rash, Dry/Scaly, Maceration, Atrophie Hornbrook, Kilian ([id number]) Blanche, Cyanosis, Ecchymosis, Hemosiderin Staining, Mottled, Pallor, Rubor, Erythema. Periwound temperature was noted as No Abnormality. Wound #7 status is Open. Original cause of wound was Gradually Appeared. The wound is located on the Right,Distal,Anterior Lower Leg. The wound measures 0.3cm length x 0.3cm width x 0.2cm depth; 0.071cm^2 area and 0.014cm^3 volume. There is Fat Layer (Subcutaneous Tissue) Exposed exposed. There is no tunneling or undermining noted. There is a small amount of serous drainage noted. The wound margin is distinct with the outline attached to the wound base. There is medium (34-66%) pink granulation within the wound bed.  There is a medium (34-66%) amount of necrotic tissue within the wound bed including Adherent Slough. The periwound skin appearance exhibited: Induration. The periwound skin appearance did not exhibit: Callus, Crepitus, Excoriation, Rash, Scarring, Dry/Scaly, Maceration, Atrophie Blanche, Cyanosis, Ecchymosis, Hemosiderin Staining, Mottled, Pallor, Rubor, Erythema. Periwound temperature was noted as No Abnormality. 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 There was a Skin/Subcutaneous  Tissue Debridement ([id number]) debridement with total area of 0.63 sq cm performed by Ricard Dillon, MD. with the following instrument(s): Curette to remove Viable and Non-Viable tissue/material including Fibrin/Slough, Skin, and Subcutaneous after achieving pain control using Other (lidocaine 4%). A time out was conducted at 01:02, prior to the start of the procedure. A Minimum amount of bleeding was controlled with Pressure. The procedure was tolerated well with a pain level of 0 throughout and a pain level of 0 following the procedure. Post Debridement Measurements: 0.9cm length x 0.7cm width x 0.2cm depth; 0.099cm^3 volume. Character of Wound/Ulcer Post Debridement is stable. Severity of Tissue Post Debridement is: Fat layer exposed. Post procedure Diagnosis Wound #6: Same as  Pre-Procedure Wound #7 Pre-procedure diagnosis of Wound #7 is a Diabetic Wound/Ulcer of the Lower Extremity located on the Right,Distal,Anterior Lower Leg .Severity of Tissue Pre Debridement is: Fat layer exposed. There was a Skin/Subcutaneous Tissue Debridement ([id number]) debridement with total area of 0.09 sq cm performed by Ricard Dillon, MD. with the following instrument(s): Curette to remove Viable and Non-Viable tissue/material including Fibrin/Slough, Skin, and Subcutaneous after achieving pain control using Other (lidocaine 4%). A time out was conducted at 01:02, prior to the start of the procedure. A Minimum amount of bleeding was controlled with Pressure. The procedure was tolerated well with a pain level of 0 throughout and a pain level of 0 following the procedure. Post Debridement Measurements: 0.3cm length x 0.3cm width x 0.2cm depth; 0.014cm^3 volume. Character of Wound/Ulcer Post Debridement is stable. Severity of Tissue Post Debridement is: Fat layer exposed. Post procedure Diagnosis Wound #7: Same as Pre-Procedure Bojanowski, Obie ([id number]) Plan Wound Cleansing: Wound #6 Right,Proximal,Anterior Lower Leg: Clean wound with Normal Saline. Wound #7 Right,Distal,Anterior Lower Leg: Clean wound with Normal Saline. Anesthetic: Wound #6 Right,Proximal,Anterior Lower Leg: Topical Lidocaine 4% cream applied to wound bed prior to debridement - in clinic Wound #7 Right,Distal,Anterior Lower Leg: Topical Lidocaine 4% cream applied to wound bed prior to debridement - in clinic Skin Barriers/Peri-Wound Care: Triamcinolone Acetonide Ointment - to surrounding areas of wounds Primary Wound Dressing: Wound #6 Right,Proximal,Anterior Lower Leg: Prisma Ag - with hydrogel Wound #7 Right,Distal,Anterior Lower Leg: Prisma Ag - with hydrogel Secondary Dressing: Wound #6 Right,Proximal,Anterior Lower Leg: ABD pad Wound #7 Right,Distal,Anterior Lower Leg: ABD pad Dressing  Change Frequency: Wound #6 Right,Proximal,Anterior Lower Leg: Change dressing every week Wound #7 Right,Distal,Anterior Lower Leg: Change dressing every week Follow-up Appointments: Return Appointment in 1 week. Edema Control: 3 Layer Compression System - Right Lower Extremity Additional Orders / Instructions: Wound #6 Right,Proximal,Anterior Lower Leg: Increase protein intake. - Vitamin A, C and Zinc Wound #7 Right,Distal,Anterior Lower Leg: Increase protein intake. - Vitamin A, C and Zinc #1 this is a patient with type 2 diabetes on no current treatment with known PAD status post stenting. He also has a significant history for prior ablations of the right greater saphenous and lesser saphenous veins on the right for refractory wounds in his legs. 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 His current wounds are small and punched out and together with the history of suggestive claudication [albeit vague] I worried somewhat about arterial insufficiency. #2 dress the wounds with collagen hydrogel and put him in 3 layer compression Schmutz, Kamin ([id number]) #3 we will see how he does towards healing these areas before considering returned to vein and vascular #4 the patient has a stent in  the right leg but I'm not exactly sure where and there is a significant reocclusion rate of this type of procedure Electronic Signature(s) Signed: 05/06/2017 11:16:43 AM By: Gretta Cool, [name], RN, CWS, [name], [name] Signed: 05/08/2017 7:29:02 AM By: [name] MD Previous Signature: 04/26/2017 3:54:39 PM Version By: [name] MD Entered By: Gretta Cool, [name], RN, CWS, Kim on 05/06/2017 11:16:42 Ladera, [name] ([id number]) -------------------------------------------------------------------------------- ROS/PFSH Details Patient Name: [name] Date of Service: 04/26/2017 12:30 PM Medical Record Number:  [id number] Patient Account Number: [id number] Date of Birth/Sex: 01/12/1951 (66 y.o. Male) Treating RN: Roger Shelter Primary Care Provider: Royetta Crochet Other Clinician: Cornell Barman Referring Provider: Royetta Crochet Treating Provider/Extender: Ricard Dillon Weeks in Treatment: 0 Information Obtained From Patient Wound History Do you currently have one or more open woundso Yes How many open wounds do you currently haveo 2 Approximately how long have you had your woundso 2 weeks How have you been treating your wound(s) until nowo vasaline Has your wound(s) ever healed and then re-openedo Yes Have you had any lab work done in the past montho Yes Who ordered the lab work doneo PCP Have you had any tests for circulation on your legso Yes Who ordered the testo [facility] Where was the test doneo [facility] Cardiovascular Complaints and Symptoms: Positive for: LE edema Negative for: Chest pain Medical History: Positive for: Arrhythmia - a fib; Deep Vein Thrombosis; Hypertension Negative for: Angina; Congestive Heart Failure; Coronary Artery Disease; Hypotension; Myocardial Infarction; Peripheral Arterial Disease; Peripheral Venous Disease; Phlebitis; Vasculitis Integumentary (Skin) Complaints and Symptoms: Positive for: Wounds; Bleeding or bruising tendency Negative for: Breakdown Medical History: Negative for: History of Burn; History of pressure wounds Constitutional Symptoms (General Health) Complaints and Symptoms: No Complaints or Symptoms Eyes Complaints and Symptoms: No Complaints or Symptoms Medical History: Negative for: Cataracts; Glaucoma; Optic Neuritis Ear/Nose/Mouth/Throat JAXSYN, AZAM ([id number]) Complaints and Symptoms: No Complaints or Symptoms Medical History: Negative for: Chronic sinus problems/congestion; Middle ear problems Past Medical History Notes: difficulty speaking Hematologic/Lymphatic Complaints and Symptoms: No Complaints or  Symptoms Medical History: Negative for: Anemia; Hemophilia; Human Immunodeficiency Virus; Lymphedema; Sickle Cell Disease Respiratory Complaints and Symptoms: No Complaints or Symptoms Medical History: Positive for: Chronic Obstructive Pulmonary Disease (COPD) Negative for: Aspiration; Asthma; Pneumothorax; Sleep Apnea; Tuberculosis Past Medical History Notes: Hospitalized for Pneumonia 02/2017 Gastrointestinal Complaints and Symptoms: No Complaints or Symptoms Medical History: Negative for: Cirrhosis ; Colitis; Crohnos; Hepatitis A; Hepatitis B; Hepatitis C Endocrine Complaints and Symptoms: No Complaints or Symptoms Medical History: Positive for: Type II Diabetes Negative for: Type I Diabetes Time with diabetes: 1 month Treated with: Diet Blood sugar tested every day: No Genitourinary Complaints and Symptoms: No Complaints or Symptoms Medical History: Negative for: End Stage Renal Disease Immunological Complaints and Symptoms: No Complaints or Symptoms Kille, Kawon ([id number]) Medical History: Negative for: Lupus Erythematosus; Raynaudos; Scleroderma Musculoskeletal Complaints and Symptoms: No Complaints or Symptoms Medical History: Positive for: Gout Negative for: Rheumatoid Arthritis; Osteoarthritis; Osteomyelitis Neurologic Complaints and Symptoms: No Complaints or Symptoms Medical History: Positive for: Neuropathy Negative for: Dementia; Quadriplegia; Paraplegia; Seizure Disorder Past Medical History Notes: CVA in 2014 Oncologic Complaints and Symptoms: No Complaints or Symptoms Medical History: Negative for: Received Chemotherapy; Received Radiation Psychiatric Medical History: Negative for: Anorexia/bulimia; Confinement Anxiety Immunizations Pneumococcal Vaccine: Received Pneumococcal Vaccination: Yes Immunization Notes: up to date Implantable Devices Hospitalization / Surgery History Name of Hospital Purpose of Hospitalization/Surgery  Date [facility] CVA 05/17/2012 Cataract Pneumonia 03/17/2017 Family and Social History Cancer: No; Diabetes: No; Heart Disease: Yes -  Mother,Father; Hereditary Spherocytosis: No; Hypertension: Yes - Mother,Father; Stroke: Yes - Father; Thyroid Problems: No; Tuberculosis: No; Current every day smoker; Marital Status - Single; Alcohol Use: Never; Drug Use: No History; Caffeine Use: Daily; Financial Concerns: No; Food, Clothing or Shelter Needs: No; Support System Lacking: No; Transportation Concerns: No; Advanced Directives: No; Patient does not want information on Advanced Directives Electronic Signature(s) SHERMAINE, BRIGHAM ([id number]) Signed: 04/26/2017 2:07:56 PM By: Roger Shelter Signed: 04/26/2017 3:54:39 PM By: [name] MD Signed: 04/27/2017 2:02:10 PM By: Gretta Cool, [name], RN, CWS, [name], [name] Entered By: Gretta Cool, [name], RN, CWS, Kim on 04/26/2017 13:02:54 Barrett, [name] ([id number]) -------------------------------------------------------------------------------- [facility] Details Patient Name: [name] Date of Service: 04/26/2017 Medical Record Number: [id number] Patient Account Number: [id number] Date of Birth/Sex: September 11, 1950 (66 y.o. 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 I87.331 Chronic venous hypertension (idiopathic) with ulcer and inflammation of right lower extremity E11.51 Type 2 diabetes mellitus with diabetic peripheral angiopathy without gangrene Facility Procedures CPT4 Code Description: [id number] - DEB SUBQ TISSUE 20 SQ CM/< ICD-10  Diagnosis Description L97.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 L97.513 Non-pressure chronic ulcer of other part of right foot with necr Modifier: osis of muscle Quantity: 1 Physician Procedures CPT4 Code Description: [id number] - WC PHYS LEVEL 4 - EST PT ICD-10 Diagnosis Description L97.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 L97.513 Non-pressure chronic ulcer of other part of right foot with necro E11.51 Type 2  diabetes mellitus with diabetic peripheral angiopathy with Modifier: 25 sis of muscle out gangrene Quantity: 1 CPT4 Code Description: [id number] - WC PHYS SUBQ TISS 20 SQ CM ICD-10 Diagnosis Description L97.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 L97.513 Non-pressure chronic ulcer of other part of right foot with necro Modifier: sis of muscle Quantity: 1 Electronic Signature(s) Signed: 04/26/2017 3:54:39 PM By: [name] MD Entered By: [name] on 04/26/2017 13:31:31

## 2017-05-03 ENCOUNTER — Ambulatory Visit: Payer: Medicare Other | Admitting: Podiatry

## 2017-05-04 ENCOUNTER — Encounter: Payer: Medicare Other | Admitting: Internal Medicine

## 2017-05-04 DIAGNOSIS — E11622 Type 2 diabetes mellitus with other skin ulcer: Secondary | ICD-10-CM | POA: Diagnosis not present

## 2017-05-08 NOTE — Progress Notes (Signed)
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 I87.331 Chronic venous hypertension (idiopathic) with ulcer and inflammation of right lower extremity  E11.51 Type 2 diabetes mellitus with diabetic peripheral angiopathy without gangrene Facility Procedures CPT4 Code Description: [id number] [place] - DEB SUBQ TISSUE 20 SQ CM/< ICD-10 Diagnosis Description 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

## 2017-05-08 NOTE — Progress Notes (Signed)
TATEM, FESLER ([id number]) Visit Report for 05/04/2017 Arrival Information Details Patient Name: [name], [name] Date of Service: 05/04/2017 9:15 AM Medical Record Number: [id number] Patient Account Number: [id number] Date of Birth/Sex: [birth year] (66 y.o. Male) Treating RN: Roger Shelter Primary Care Terris Germano: Royetta Crochet Other Clinician: Referring Josedaniel Haye: Royetta Crochet Treating Taryn Nave/Extender: Tito Dine in Treatment: 1 Visit Information History Since Last Visit All ordered tests and consults were completed: No Patient Arrived: Ambulatory Added or deleted any medications: No Arrival Time: 09:25 Any new allergies or adverse reactions: No Accompanied By: self Had a fall or experienced change in No Transfer Assistance: None activities of daily living that may affect Patient Requires Transmission-Based No risk of falls: Precautions: Signs or symptoms of abuse/neglect since last visito No Patient Has Alerts: Yes Hospitalized since last visit: No Patient Alerts: Patient on Blood Pain Present Now: Yes Thinner Coumadin 81mg  Aspirin DM II o Electronic Signature(s) Signed: 05/04/2017 2:59:39 PM By: Roger Shelter Entered By: Roger Shelter on 05/04/2017 09:26:17 Heupel, [name] ([id number]) -------------------------------------------------------------------------------- Encounter Discharge Information Details Patient Name: [name] Date of Service: 05/04/2017 9:15 AM Medical Record Number: [id number] Patient Account Number: [id number] Date of Birth/Sex: Nov 09, 1950 (66 y.o. Male) Treating RN: Roger Shelter Primary Care Aimar Shrewsbury: Royetta Crochet Other Clinician: Referring Ahuva Poynor: Royetta Crochet Treating Michie Molnar/Extender: Tito Dine in Treatment: 1 Encounter Discharge Information Items Discharge Pain Level: 0 Discharge Condition: Stable Ambulatory Status: Ambulatory Discharge Destination: Home Transportation:  Private Auto Accompanied By: self Schedule Follow-up Appointment: Yes Medication Reconciliation completed and No provided to Patient/Care Shima Compere: Patient Clinical Summary of Care: Declined Electronic Signature(s) Signed: 05/04/2017 2:59:39 PM By: Roger Shelter Entered By: Roger Shelter on 05/04/2017 10:08:42 Hohman, [name] ([id number]) -------------------------------------------------------------------------------- Lower Extremity Assessment Details Patient Name: [name] Date of Service: 05/04/2017 9:15 AM Medical Record Number: [id number] Patient Account Number: [id number] Date of Birth/Sex: 01-20-1951 (66 y.o. Male) Treating RN: Roger Shelter Primary Care Drayson Dorko: Royetta Crochet Other Clinician: Referring Jameya Pontiff: Royetta Crochet Treating Blaklee Shores/Extender: Ricard Dillon Weeks in Treatment: 1 Edema Assessment Assessed: [Left: No] [Right: No] Edema: [Left: N] [Right: o] Calf Left: Right: Point of Measurement: 40 cm From Medial Instep cm 43.5 cm Ankle Left: Right: Point of Measurement: 13 cm From Medial Instep cm 27.2 cm Vascular Assessment Claudication: Claudication Assessment [Right:None] Pulses: Dorsalis Pedis Palpable: [Right:Yes] Posterior Tibial Extremity colors, hair growth, and conditions: Extremity Color: [Right:Hyperpigmented] Hair Growth on Extremity: [Right:Yes] Temperature of Extremity: [Right:Warm] Capillary Refill: [Right:> 3 seconds] Toe Nail Assessment Left: Right: Thick: Yes Discolored: Yes Deformed: Yes Improper Length and Hygiene: Yes Electronic Signature(s) Signed: 05/04/2017 2:59:39 PM By: Roger Shelter Entered By: Roger Shelter on 05/04/2017 09:42:01 Ashlock, [name] ([id number]) -------------------------------------------------------------------------------- Multi Wound Chart Details Patient Name: [name] Date of Service: 05/04/2017 9:15 AM Medical Record Number: [id number] Patient  Account Number: [id number] Date of Birth/Sex: Dec 21, 1950 (66 y.o. Male) Treating RN: Roger Shelter Primary Care Zettie Gootee: Royetta Crochet Other Clinician: Referring Gerene Nedd: Royetta Crochet Treating Alayshia Marini/Extender: Ricard Dillon Weeks in Treatment: 1 Vital Signs Height(in): 74 Pulse(bpm): 3 Weight(lbs): 252 Blood Pressure(mmHg): 143/79 Body Mass Index(BMI): 32 Temperature(F): 98.0 Respiratory Rate 16 (breaths/min): Photos: [6:No Photos] [7:No Photos] [N/A:N/A] Wound Location: [6:Right Lower Leg - Anterior, Proximal] [7:Right Lower Leg - Anterior, Distal] [N/A:N/A] Wounding Event: [6:Gradually Appeared] [7:Gradually Appeared] [N/A:N/A] 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] [7:Diabetic Wound/Ulcer of the Lower Extremity] [N/A:N/A] Comorbid History: [6:Chronic Obstructive Pulmonary Disease (COPD), Arrhythmia, Deep Vein Thrombosis, Hypertension, Type II Diabetes, Gout, Neuropathy] [7:Chronic Obstructive Pulmonary Disease (COPD), Arrhythmia, Deep Vein Thrombosis,  Hypertension, Type  II Diabetes, Gout, Neuropathy] [N/A:N/A] Date Acquired: [6:04/12/2017] [7:04/12/2017] [N/A:N/A] Weeks of Treatment: [6:1] [7:1] [N/A:N/A] Wound Status: [6:Open] [7:Open] [N/A:N/A] Measurements L x W x D [6:0.6x0.7x0.2] [7:0.2x0.2x0.1] [N/A:N/A] (cm) Area (cm) : [6:0.33] [7:0.031] [N/A:N/A] Volume (cm) : [6:0.066] [7:0.003] [N/A:N/A] % Reduction in Area: [6:33.30%] [7:56.30%] [N/A:N/A] % Reduction in Volume: [6:33.30%] [7:78.60%] [N/A:N/A] Classification: [6:Grade 1] [7:Grade 1] [N/A:N/A] Exudate Amount: [6:Small] [7:None Present] [N/A:N/A] Exudate Type: [6:Serous] [7:N/A] [N/A:N/A] Exudate Color: [6:amber] [7:N/A] [N/A:N/A] Wound Margin: [6:Distinct, outline attached] [7:Distinct, outline attached] [N/A:N/A] Granulation Amount: [6:Medium (34-66%)] [7:Small (1-33%)] [N/A:N/A] Granulation Quality: [6:Pink] [7:Pink] [N/A:N/A] Necrotic Amount: [6:Medium (34-66%)]  [7:Large (67-100%)] [N/A:N/A] Necrotic Tissue: [6:Adherent Slough] [7:Eschar] [N/A:N/A] Exposed Structures: [6:Fat Layer (Subcutaneous Tissue) Exposed: Yes Fascia: No Tendon: No Muscle: No Joint: No Bone: No] [7:Fat Layer (Subcutaneous Tissue) Exposed: Yes Fascia: No Tendon: No Muscle: No Joint: No Bone: No] [N/A:N/A] Epithelialization: Medium (34-66%) Large (67-100%) N/A Debridement: Debridement ([id number]) N/A N/A Pre-procedure 09:48 N/A N/A Verification/Time Out Taken: Pain Control: Other N/A N/A Tissue Debrided: Fibrin/Slough, Skin, N/A N/A Subcutaneous Level: Skin/Subcutaneous Tissue N/A N/A Debridement Area (sq cm): 0.42 N/A N/A Instrument: Curette N/A N/A Bleeding: Minimum N/A N/A Hemostasis Achieved: Pressure N/A N/A Procedural Pain: 0 N/A N/A Post Procedural Pain: 0 N/A N/A Debridement Treatment Procedure was tolerated well N/A N/A Response: Post Debridement 0.6x0.7x0.2 N/A N/A Measurements L x W x D (cm) Post Debridement Volume: 0.066 N/A N/A (cm) Periwound Skin Texture: Excoriation: Yes Induration: Yes N/A Induration: Yes Excoriation: No Callus: No Callus: No Crepitus: No Crepitus: No Rash: No Rash: No Scarring: No Scarring: No Periwound Skin Moisture: Dry/Scaly: Yes Dry/Scaly: Yes N/A Maceration: No Maceration: No Periwound Skin Color: Atrophie Blanche: No Atrophie Blanche: No N/A Cyanosis: No Cyanosis: No Ecchymosis: No Ecchymosis: No Erythema: No Erythema: No Hemosiderin Staining: No Hemosiderin Staining: No Mottled: No Mottled: No Pallor: No Pallor: No Rubor: No Rubor: No Temperature: No Abnormality No Abnormality N/A Tenderness on Palpation: Yes No N/A Wound Preparation: Ulcer Cleansing: Ulcer Cleansing: N/A Rinsed/Irrigated with Saline Rinsed/Irrigated with Saline Topical Anesthetic Applied: Topical Anesthetic Applied: Other: lidocaine 4% Other: lidocaine 4% Procedures Performed: Debridement N/A N/A Treatment Notes Electronic  Signature(s) Signed: 05/08/2017 7:28:22 AM By: [name] MD Entered By: [name] on 05/04/2017 09:57:47 Nicholls, [name] ([id number]) -------------------------------------------------------------------------------- Multi-Disciplinary Care Plan Details Patient Name: [name] Date of Service: 05/04/2017 9:15 AM Medical Record Number: [id number] Patient Account Number: [id number] Date of Birth/Sex: [birth year] (66 y.o. Male) Treating RN: Roger Shelter Primary Care Matisyn Cabeza: Royetta Crochet Other Clinician: Referring Donel Osowski: Royetta Crochet Treating Phaedra Colgate/Extender: Tito Dine in Treatment: 1 Active Inactive ` Orientation to the Wound Care Program Nursing Diagnoses: Knowledge deficit related to the wound healing center program Goals: Patient/caregiver will verbalize understanding of the [facility] Program Date Initiated: 04/26/2017 Target Resolution Date: 05/27/2017 Goal Status: Active Interventions: Provide education on orientation to the wound center Notes: ` Wound/Skin Impairment Nursing Diagnoses: Impaired tissue integrity Knowledge deficit related to ulceration/compromised skin integrity Goals: Patient/caregiver will verbalize understanding of skin care regimen Date Initiated: 04/26/2017 Target Resolution Date: 05/27/2017 Goal Status: Active Ulcer/skin breakdown will have a volume reduction of 30% by week 4 Date Initiated: 04/26/2017 Target Resolution Date: 05/27/2017 Goal Status: Active Interventions: Assess ulceration(s) every visit Provide education on ulcer and skin care Treatment Activities: Skin care regimen initiated : 04/26/2017 Notes: Electronic Signature(s) Signed: 05/04/2017 2:59:39 PM By: [name], [name] ([id number]) Entered By: Roger Shelter on 05/04/2017 09:44:51 Happel, [name] ([id number]) -------------------------------------------------------------------------------- Pain  Assessment Details  Patient Name: BERLE, FITZ Date of Service: 05/04/2017 9:15 AM Medical Record Number: [id number] Patient Account Number: [id number] Date of Birth/Sex: February 19, 1951 (66 y.o. Male) Treating RN: Roger Shelter Primary Care Mehlani Blankenburg: Royetta Crochet Other Clinician: Referring Ardice Boyan: Royetta Crochet Treating Frankie Zito/Extender: Tito Dine in Treatment: 1 Active Problems Location of Pain Severity and Description of Pain Patient Has Paino Yes Site Locations Pain Location: Pain in Ulcers Duration of the Pain. Constant / Intermittento Intermittent Rate the pain. Current Pain Level: 6 Pain Management and Medication Current Pain Management: Electronic Signature(s) Signed: 05/04/2017 2:59:39 PM By: Roger Shelter Entered By: Roger Shelter on 05/04/2017 09:27:54 Newborn, [name] ([id number]) -------------------------------------------------------------------------------- Patient/Caregiver Education Details Patient Name: [name] Date of Service: 05/04/2017 9:15 AM Medical Record Number: [id number] Patient Account Number: [id number] Date of Birth/Gender: October 22, 1950 (66 y.o. Male) Treating RN: Roger Shelter Primary Care Physician: Royetta Crochet Other Clinician: Referring Physician: Royetta Crochet Treating Physician/Extender: Tito Dine in Treatment: 1 Education Assessment Education Provided To: Patient Education Topics Provided Wound Debridement: Handouts: Wound Debridement Methods: Explain/Verbal Responses: State content correctly Wound/Skin Impairment: Handouts: Caring for Your Ulcer Methods: Explain/Verbal Responses: State content correctly Electronic Signature(s) Signed: 05/04/2017 2:59:39 PM By: Roger Shelter Entered By: Roger Shelter on 05/04/2017 10:09:01 [name], [name] ([id number]) -------------------------------------------------------------------------------- Wound Assessment  Details Patient Name: [name] Date of Service: 05/04/2017 9:15 AM Medical Record Number: [id number] Patient Account Number: [id number] Date of Birth/Sex: 28-Apr-1951 (66 y.o. Male) Treating RN: Roger Shelter Primary Care Austen Wygant: Royetta Crochet Other Clinician: Referring Yvonne Petite: Royetta Crochet Treating Berniece Abid/Extender: Ricard Dillon Weeks in Treatment: 1 Wound Status Wound Number: 6 Primary Diabetic Wound/Ulcer of the Lower Extremity Etiology: Wound Location: Right Lower Leg - Anterior, Proximal Wound Open Wounding Event: Gradually Appeared Status: Date Acquired: 04/12/2017 Comorbid Chronic Obstructive Pulmonary Disease Weeks Of Treatment: 1 History: (COPD), Arrhythmia, Deep Vein Thrombosis, Clustered Wound: No Hypertension, Type II Diabetes, Gout, Neuropathy Photos Wound Measurements Length: (cm) 0.6 Width: (cm) 0.7 Depth: (cm) 0.2 Area: (cm) 0.33 Volume: (cm) 0.066 % Reduction in Area: 33.3% % Reduction in Volume: 33.3% Epithelialization: Medium (34-66%) Tunneling: No Undermining: No Wound Description Classification: Grade 1 Wound Margin: Distinct, outline attached Exudate Amount: Small Exudate Type: Serous Exudate Color: amber Foul Odor After Cleansing: No Slough/Fibrino Yes Wound Bed Granulation Amount: Medium (34-66%) Exposed Structure Granulation Quality: Pink Fascia Exposed: No Necrotic Amount: Medium (34-66%) Fat Layer (Subcutaneous Tissue) Exposed: Yes Necrotic Quality: Adherent Slough Tendon Exposed: No Muscle Exposed: No Joint Exposed: No Bone Exposed: No Periwound Skin Texture Fells, Undra ([id number]) Texture Color No Abnormalities Noted: No No Abnormalities Noted: No Callus: No Atrophie Blanche: No Crepitus: No Cyanosis: No Excoriation: Yes Ecchymosis: No Induration: Yes Erythema: No Rash: No Hemosiderin Staining: No Scarring: No Mottled: No Pallor: No Moisture Rubor: No No Abnormalities Noted:  No Dry / Scaly: Yes Temperature / Pain Maceration: No Temperature: No Abnormality Tenderness on Palpation: Yes Wound Preparation Ulcer Cleansing: Rinsed/Irrigated with Saline Topical Anesthetic Applied: Other: lidocaine 4%, Treatment Notes Wound #6 (Right, Proximal, Anterior Lower Leg) 1. Cleansed with: Clean wound with Normal Saline 2. Anesthetic Topical Lidocaine 4% cream to wound bed prior to debridement 3. Peri-wound Care: Moisturizing lotion Other peri-wound care (specify in notes) 4. Dressing Applied: Hydrogel Prisma Ag 5. Secondary Dressing Applied Non-Adherent pad 7. Secured with 3 Layer Compression System - Right Lower Extremity Patient to wear own compression stockings Notes TCA cream to surrounding areas of wounds Electronic Signature(s) Signed: 05/04/2017 10:14:03 AM By: Roger Shelter Entered By: Roger Shelter on 05/04/2017 10:14:03  BENINO, KORINEK ([id number]) -------------------------------------------------------------------------------- Wound Assessment Details Patient Name: SILVANO, GAROFANO Date of Service: 05/04/2017 9:15 AM Medical Record Number: [id number] Patient Account Number: [id number] Date of Birth/Sex: Nov 29, 1950 (66 y.o. Male) Treating RN: Roger Shelter Primary Care Maciej Schweitzer: Royetta Crochet Other Clinician: Referring Ivaan Liddy: Royetta Crochet Treating Johnedward Brodrick/Extender: Ricard Dillon Weeks in Treatment: 1 Wound Status Wound Number: 7 Primary Diabetic Wound/Ulcer of the Lower Extremity Etiology: Wound Location: Right Lower Leg - Anterior, Distal Wound Open Wounding Event: Gradually Appeared Status: Date Acquired: 04/12/2017 Comorbid Chronic Obstructive Pulmonary Disease Weeks Of Treatment: 1 History: (COPD), Arrhythmia, Deep Vein Thrombosis, Clustered Wound: No Hypertension, Type II Diabetes, Gout, Neuropathy Photos Wound Measurements Length: (cm) 0.2 Width: (cm) 0.2 Depth: (cm) 0.1 Area: (cm) 0.031 Volume:  (cm) 0.003 % Reduction in Area: 56.3% % Reduction in Volume: 78.6% Epithelialization: Large (67-100%) Tunneling: No Undermining: No Wound Description Classification: Grade 1 Foul Wound Margin: Distinct, outline attached Sloug Exudate Amount: None Present Odor After Cleansing: No h/Fibrino Yes Wound Bed Granulation Amount: Small (1-33%) Exposed Structure Granulation Quality: Pink Fascia Exposed: No Necrotic Amount: Large (67-100%) Fat Layer (Subcutaneous Tissue) Exposed: Yes Necrotic Quality: Eschar Tendon Exposed: No Muscle Exposed: No Joint Exposed: No Bone Exposed: No Periwound Skin Texture Texture Color No Abnormalities Noted: No No Abnormalities Noted: No Iden, Alson ([id number]) Callus: No Atrophie Blanche: No Crepitus: No Cyanosis: No Excoriation: No Ecchymosis: No Induration: Yes Erythema: No Rash: No Hemosiderin Staining: No Scarring: No Mottled: No Pallor: No Moisture Rubor: No No Abnormalities Noted: No Dry / Scaly: Yes Temperature / Pain Maceration: No Temperature: No Abnormality Wound Preparation Ulcer Cleansing: Rinsed/Irrigated with Saline Topical Anesthetic Applied: Other: lidocaine 4%, Treatment Notes Wound #7 (Right, Distal, Anterior Lower Leg) 1. Cleansed with: Clean wound with Normal Saline 2. Anesthetic Topical Lidocaine 4% cream to wound bed prior to debridement 3. Peri-wound Care: Moisturizing lotion Other peri-wound care (specify in notes) 4. Dressing Applied: Hydrogel Prisma Ag 5. Secondary Dressing Applied Non-Adherent pad 7. Secured with 3 Layer Compression System - Right Lower Extremity Notes TCA cream to surrounding areas of wounds Electronic Signature(s) Signed: 05/04/2017 10:14:25 AM By: Roger Shelter Entered By: Roger Shelter on 05/04/2017 10:14:25 Maalouf, [name] ([id number]) -------------------------------------------------------------------------------- Vitals Details Patient Name:  [name] Date of Service: 05/04/2017 9:15 AM Medical Record Number: [id number] Patient Account Number: [id number] Date of Birth/Sex: [birth year] (66 y.o. Male) Treating RN: Roger Shelter Primary Care Chane Cowden: Royetta Crochet Other Clinician: Referring Shaquna Geigle: Royetta Crochet Treating Quavis Klutz/Extender: Tito Dine in Treatment: 1 Vital Signs Time Taken: 09:27 Temperature (F): 98.0 Height (in): 74 Pulse (bpm): 58 Weight (lbs): 252 Respiratory Rate (breaths/min): 16 Body Mass Index (BMI): 32.4 Blood Pressure (mmHg): 143/79 Reference Range: 80 - 120 mg / dl Electronic Signature(s) Signed: 05/04/2017 2:59:39 PM By: Roger Shelter Entered By: Roger Shelter on 05/04/2017 09:28:15

## 2017-05-11 ENCOUNTER — Encounter: Payer: Medicare Other | Admitting: Internal Medicine

## 2017-05-11 DIAGNOSIS — E11622 Type 2 diabetes mellitus with other skin ulcer: Secondary | ICD-10-CM | POA: Diagnosis not present

## 2017-05-12 DIAGNOSIS — M1990 Unspecified osteoarthritis, unspecified site: Secondary | ICD-10-CM | POA: Insufficient documentation
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
## 2017-05-12 NOTE — Progress Notes (Signed)
UCHEN[place], RAPPAPORT ([id number]) Visit Report for 05/11/2017 Arrival Information Details Patient Name: [name], [name] Date of Service: 05/11/2017 10:15 AM Medical Record Number: [id number] Patient Account Number: [id number] Date of Birth/Sex: 11/20/1950 (66 y.o. Male) Treating RN: Montey Hora Primary Care Aashika Carta: Royetta Crochet Other Clinician: Referring Nao Linz: Royetta Crochet Treating Misty Rago/Extender: Tito Dine in Treatment: 2 Visit Information History Since Last Visit Added or deleted any medications: No Patient Arrived: Ambulatory Any new allergies or adverse reactions: No Arrival Time: 10:08 Had a fall or experienced change in No Accompanied By: self activities of daily living that may affect Transfer Assistance: None risk of falls: Patient Identification Verified: Yes Signs or symptoms of abuse/neglect since last visito No Secondary Verification Process Yes Hospitalized since last visit: No Completed: Has Dressing in Place as Prescribed: Yes Patient Requires Transmission-Based No Has Compression in Place as Prescribed: Yes Precautions: Pain Present Now: No Patient Has Alerts: Yes Patient Alerts: Patient on Blood Thinner Coumadin 81mg  Aspirin DM II o Electronic Signature(s) Signed: 05/11/2017 4:52:08 PM By: Montey Hora Entered By: Montey Hora on 05/11/2017 10:08:40 Clum, [name] ([id number]) -------------------------------------------------------------------------------- Compression Therapy Details Patient Name: [name] Date of Service: 05/11/2017 10:15 AM Medical Record Number: [id number] Patient Account Number: [id number] Date of Birth/Sex: February 28, 1951 (66 y.o. Male) Treating RN: Montey Hora Primary Care Fredderick Swanger: Royetta Crochet Other Clinician: Referring Samie Reasons: Royetta Crochet Treating Arvella Massingale/Extender: Ricard Dillon Weeks in Treatment: 2 Compression Therapy Performed for Wound Assessment: Wound #6  Right,Proximal,Anterior Lower Leg Performed By: Clinician Montey Hora, RN Compression Type: Three Layer Post Procedure Diagnosis Same as Pre-procedure Notes previous arterial testing Electronic Signature(s) Signed: 05/11/2017 3:17:30 PM By: Montey Hora Entered By: Montey Hora on 05/11/2017 15:17:30 Dillenburg, [name] ([id number]) -------------------------------------------------------------------------------- Encounter Discharge Information Details Patient Name: [name] Date of Service: 05/11/2017 10:15 AM Medical Record Number: [id number] Patient Account Number: [id number] Date of Birth/Sex: [birth year] (66 y.o. Male) Treating RN: Montey Hora Primary Care Raniya Golembeski: Royetta Crochet Other Clinician: Referring Huxton Glaus: Royetta Crochet Treating Bailea Beed/Extender: Tito Dine in Treatment: 2 Encounter Discharge Information Items Discharge Pain Level: 0 Discharge Condition: Stable Ambulatory Status: Ambulatory Discharge Destination: Home Transportation: Private Auto Accompanied By: self Schedule Follow-up Appointment: Yes Medication Reconciliation completed and No provided to Patient/Care Addisen Chappelle: Patient Clinical Summary of Care: Declined Electronic Signature(s) Signed: 05/11/2017 3:19:42 PM By: Montey Hora Entered By: Montey Hora on 05/11/2017 15:19:41 Bjelland, [name] ([id number]) -------------------------------------------------------------------------------- Lower Extremity Assessment Details Patient Name: [name] Date of Service: 05/11/2017 10:15 AM Medical Record Number: [id number] Patient Account Number: [id number] Date of Birth/Sex: [birth year] (66 y.o. Male) Treating RN: Montey Hora Primary Care Edda Orea: Royetta Crochet Other Clinician: Referring Zubayr Bednarczyk: Royetta Crochet Treating Klea Nall/Extender: Ricard Dillon Weeks in Treatment: 2 Edema Assessment Assessed: [Left: No] [Right: No] E[Left: dema]  [Right: :] Calf Left: Right: Point of Measurement: 40 cm From Medial Instep cm 47 cm Ankle Left: Right: Point of Measurement: 13 cm From Medial Instep cm 28.1 cm Vascular Assessment Pulses: Dorsalis Pedis Palpable: [Right:Yes] Posterior Tibial Extremity colors, hair growth, and conditions: Extremity Color: [Right:Hyperpigmented] Hair Growth on Extremity: [Right:No] Temperature of Extremity: [Right:Warm] Capillary Refill: [Right:< 3 seconds] Toe Nail Assessment Left: Right: Thick: Yes Discolored: Yes Deformed: Yes Improper Length and Hygiene: Yes Electronic Signature(s) Signed: 05/11/2017 4:52:08 PM By: Montey Hora Entered By: Montey Hora on 05/11/2017 10:16:47 Ducre, [name] ([id number]) -------------------------------------------------------------------------------- Multi Wound Chart Details Patient Name: [name] Date of Service: 05/11/2017 10:15 AM Medical Record Number: [id number] Patient Account Number: [id number] Date of Birth/Sex: 22-Dec-1950 (66 y.o. 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] [7:Diabetic Wound/Ulcer of the Lower Extremity] [N/A:N/A] Comorbid History: [6:Chronic Obstructive Pulmonary Disease (COPD), Arrhythmia, Deep Vein Thrombosis, Hypertension, Type II Diabetes, Gout, Neuropathy] [7:N/A] [N/A:N/A] Date Acquired:  [6:04/12/2017] [7:04/12/2017] [N/A:N/A] Weeks of Treatment: [6:2] [7:2] [N/A:N/A] Wound Status: [6:Open] [7:Healed - Epithelialized] [N/A:N/A] Measurements L x W x D [6:0.7x0.8x0.1] [7:0x0x0] [N/A:N/A] (cm) Area (cm) : [6:0.44] [7:0] [N/A:N/A] Volume (cm) : [6:0.044] [7:0] [N/A:N/A] % Reduction in Area: [6:11.10%] [7:100.00%] [N/A:N/A] % Reduction in Volume: [6:55.60%] [7:100.00%] [N/A:N/A] Classification: [6:Grade 1] [7:Grade 1] [N/A:N/A] Exudate Amount: [6:Small] [7:N/A] [N/A:N/A] Exudate Type: [6:Serous] [7:N/A] [N/A:N/A] Exudate Color: [6:amber] [7:N/A] [N/A:N/A] Wound Margin: [6:Distinct, outline attached] [7:N/A] [N/A:N/A] Granulation Amount: [6:Large (67-100%)] [7:N/A] [N/A:N/A] Granulation Quality: [6:Red] [7:N/A] [N/A:N/A] Necrotic Amount: [6:Small (1-33%)] [7:N/A] [N/A:N/A] Exposed Structures: [6:Fat Layer (Subcutaneous Tissue) Exposed: Yes Fascia: No Tendon: No Muscle: No Joint: No Bone: No] [7:N/A] [N/A:N/A] Epithelialization: [6:Medium (34-66%)] [7:N/A] [N/A:N/A] Periwound Skin Texture: Excoriation: Yes No Abnormalities Noted N/A Induration: Yes Callus: No Crepitus: No Rash: No Scarring: No Periwound Skin Moisture: Dry/Scaly: Yes No Abnormalities Noted N/A Maceration: No Periwound Skin Color: Atrophie Blanche: No No Abnormalities Noted N/A Cyanosis: No Ecchymosis: No Erythema: No Hemosiderin Staining: No Mottled: No Pallor: No Rubor: No Temperature: No Abnormality N/A N/A Tenderness on Palpation: Yes No N/A Wound Preparation: Ulcer Cleansing: N/A N/A Rinsed/Irrigated with Saline, Other: soap and water Topical Anesthetic Applied: Other: lidocaine 4% Treatment Notes Electronic Signature(s) Signed: 05/11/2017 4:35:05 PM By: [name] MD Entered By: [name] on 05/11/2017 10:47:38 Reasner, [name] ([id number]) -------------------------------------------------------------------------------- Multi-Disciplinary Care Plan Details Patient  Name: [name] Date of Service: 05/11/2017 10:15 AM Medical Record Number: [id number] Patient Account Number: [id number] Date of Birth/Sex: [birth year] (66 y.o. Male) Treating RN: Montey Hora Primary Care Tanza Pellot: Royetta Crochet Other Clinician: Referring Vallery Mcdade: Royetta Crochet Treating Aren Cherne/Extender: Tito Dine in Treatment: 2 Active Inactive ` Orientation to the Wound Care Program Nursing Diagnoses: Knowledge deficit related to the wound healing center program Goals: Patient/caregiver will verbalize understanding of the [facility] Program Date Initiated: 04/26/2017 Target Resolution Date: 05/27/2017 Goal Status: Active Interventions: Provide education on orientation to the wound center Notes: ` Wound/Skin Impairment Nursing Diagnoses: Impaired tissue integrity Knowledge deficit related to ulceration/compromised skin integrity Goals: Patient/caregiver will verbalize understanding of skin care regimen Date Initiated: 04/26/2017 Target Resolution Date: 05/27/2017 Goal Status: Active Ulcer/skin breakdown will have a volume reduction of 30% by week 4 Date Initiated: 04/26/2017 Target Resolution Date: 05/27/2017 Goal Status: Active Interventions: Assess ulceration(s) every visit Provide education on ulcer and skin care Treatment Activities: Skin care regimen initiated : 04/26/2017 Notes: Electronic Signature(s) Signed: 05/11/2017 4:52:08 PM By: Tamsen Snider, [name] ([id number]) Entered By: Montey Hora on 05/11/2017 10:25:27 Tapanes, [name] ([id number]) -------------------------------------------------------------------------------- Pain Assessment Details Patient Name: [name] Date of Service: 05/11/2017 10:15 AM Medical Record Number: [id number] Patient Account Number: [id number] Date of Birth/Sex: 04-06-1951 (66 y.o. Male) Treating RN: Montey Hora Primary Care Tyrice Hewitt: Royetta Crochet Other  Clinician: Referring [place] Fassnacht: Royetta Crochet Treating Leighanne Adolph/Extender: Ricard Dillon Weeks in Treatment: 2 Active Problems Location of  Pain Severity and Description of Pain Patient Has Paino Yes Site Locations Pain Location: Pain in Ulcers With Dressing Change: Yes Duration of the Pain. Constant / Intermittento Intermittent Pain Management and Medication Current Pain Management: Goals for Pain Management hurts a little bit Notes Topical or injectable lidocaine is offered to patient for acute pain when surgical debridement is performed. If needed, Patient is instructed to use over the counter pain medication for the following 24-48 hours after debridement. Wound care MDs do not prescribed pain medications. Patient has chronic pain or uncontrolled pain. Patient has been instructed to make an appointment with their Primary Care Physician for pain management. Electronic Signature(s) Signed: 05/11/2017 4:52:08 PM By: Montey Hora Entered By: Montey Hora on 05/11/2017 10:09:02 Breault, [name] ([id number]) -------------------------------------------------------------------------------- Patient/Caregiver Education Details Patient Name: [name] Date of Service: 05/11/2017 10:15 AM Medical Record Number: [id number] Patient Account Number: [id number] Date of Birth/Gender: Dec 05, 1950 (66 y.o. Male) Treating RN: Montey Hora Primary Care Physician: Royetta Crochet Other Clinician: Referring Physician: Royetta Crochet Treating Physician/Extender: Tito Dine in Treatment: 2 Education Assessment Education Provided To: Patient Education Topics Provided Venous: Handouts: Other: leg elevation Methods: Explain/Verbal Responses: State content correctly Electronic Signature(s) Signed: 05/11/2017 4:52:08 PM By: Montey Hora Entered By: Montey Hora on 05/11/2017 15:20:09 Malizia, [name]  ([id number]) -------------------------------------------------------------------------------- Wound Assessment Details Patient Name: [name] Date of Service: 05/11/2017 10:15 AM Medical Record Number: [id number] Patient Account Number: [id number] Date of Birth/Sex: 02/26/1951 (66 y.o. Male) Treating RN: Montey Hora Primary Care Ava Tangney: Royetta Crochet Other Clinician: Referring Felicidad Sugarman: Royetta Crochet Treating Cam Harnden/Extender: Ricard Dillon Weeks in Treatment: 2 Wound Status Wound Number: 6 Primary Diabetic Wound/Ulcer of the Lower Extremity Etiology: Wound Location: Right Lower Leg - Anterior, Proximal Wound Open Wounding Event: Gradually Appeared Status: Date Acquired: 04/12/2017 Comorbid Chronic Obstructive Pulmonary Disease Weeks Of Treatment: 2 History: (COPD), Arrhythmia, Deep Vein Thrombosis, Clustered Wound: No Hypertension, Type II Diabetes, Gout, Neuropathy Photos Photo Uploaded By: Montey Hora on 05/11/2017 16:53:04 Wound Measurements Length: (cm) 0.7 Width: (cm) 0.8 Depth: (cm) 0.1 Area: (cm) 0.44 Volume: (cm) 0.044 % Reduction in Area: 11.1% % Reduction in Volume: 55.6% Epithelialization: Medium (34-66%) Tunneling: No Undermining: No Wound Description Classification: Grade 1 Wound Margin: Distinct, outline attached Exudate Amount: Small Exudate Type: Serous Exudate Color: amber Foul Odor After Cleansing: No Slough/Fibrino Yes Wound Bed Granulation Amount: Large (67-100%) Exposed Structure Granulation Quality: Red Fascia Exposed: No Necrotic Amount: Small (1-33%) Fat Layer (Subcutaneous Tissue) Exposed: Yes Necrotic Quality: Adherent Slough Tendon Exposed: No Muscle Exposed: No Joint Exposed: No Bone Exposed: No Truman, [name] ([id number]) Periwound Skin Texture Texture Color No Abnormalities Noted: No No Abnormalities Noted: No Callus: No Atrophie Blanche: No Crepitus: No Cyanosis: No Excoriation:  Yes Ecchymosis: No Induration: Yes Erythema: No Rash: No Hemosiderin Staining: No Scarring: No Mottled: No Pallor: No Moisture Rubor: No No Abnormalities Noted: No Dry / Scaly: Yes Temperature / Pain Maceration: No Temperature: No Abnormality Tenderness on Palpation: Yes Wound Preparation Ulcer Cleansing: Rinsed/Irrigated with Saline, Other: soap and water, Topical Anesthetic Applied: Other: lidocaine 4%, Treatment Notes Wound #6 (Right, Proximal, Anterior Lower Leg) 1. Cleansed with: Cleanse wound with antibacterial soap and water 2. Anesthetic Topical Lidocaine 4% cream to wound bed prior to debridement 3. Peri-wound Care: Barrier cream 4. Dressing Applied: Prisma Ag 5. Secondary Dressing Applied Non-Adherent pad 7. Secured with 3 Layer Compression System - Right Lower Extremity Notes TCA cream to surrounding areas of wounds Electronic Signature(s) Signed: 05/11/2017 4:52:08 PM By: Montey Hora  Entered By: Montey Hora on 05/11/2017 10:24:37 Randazzo, [name] ([id number]) -------------------------------------------------------------------------------- Wound Assessment Details Patient Name: [name], [name] Date of Service: 05/11/2017 10:15 AM Medical Record Number: [id number] Patient Account Number: [id number] Date of Birth/Sex: [birth year] (66 y.o. Male) Treating RN: Montey Hora Primary Care Samariyah Cowles: Royetta Crochet Other Clinician: Referring Rozlyn Yerby: Royetta Crochet Treating Isidora Laham/Extender: Ricard Dillon Weeks in Treatment: 2 Wound Status Wound Number: 7 Primary Diabetic Wound/Ulcer of the Lower Etiology: Extremity Wound Location: Right, Distal, Anterior Lower Leg Wound Status: Healed - Epithelialized Wounding Event: Gradually Appeared Date Acquired: 04/12/2017 Weeks Of Treatment: 2 Clustered Wound: No Photos Photo Uploaded By: Montey Hora on 05/11/2017 16:53:05 Wound Measurements Length: (cm) 0 % R Width: (cm) 0 % R Depth: (cm)  0 Area: (cm) 0 Volume: (cm) 0 eduction in Area: 100% eduction in Volume: 100% Wound Description Classification: Grade 1 Periwound Skin Texture Texture Color No Abnormalities Noted: No No Abnormalities Noted: No Moisture No Abnormalities Noted: No Electronic Signature(s) Signed: 05/11/2017 4:52:08 PM By: Montey Hora Entered By: Montey Hora on 05/11/2017 10:21:15 Younce, [name] ([id number]) -------------------------------------------------------------------------------- [facility] Details Patient Name: [name] Date of Service: 05/11/2017 10:15 AM Medical Record Number: [id number] Patient Account Number: [id number] Date of Birth/Sex: [birth year] (66 y.o. Male) Treating RN: Montey Hora Primary Care Tomie Elko: Royetta Crochet Other Clinician: Referring Randeep Biondolillo: Royetta Crochet Treating Luccia Reinheimer/Extender: Ricard Dillon Weeks in Treatment: 2 Vital Signs Time Taken: 10:09 Temperature (F): 98.5 Height (in): 74 Pulse (bpm): 61 Weight (lbs): 252 Respiratory Rate (breaths/min): 18 Body Mass Index (BMI): 32.4 Blood Pressure (mmHg): 151/66 Reference Range: 80 - 120 mg / dl Electronic Signature(s) Signed: 05/11/2017 4:52:08 PM By: Montey Hora Entered By: Montey Hora on 05/11/2017 10:10:52

## 2017-05-13 ENCOUNTER — Encounter: Payer: Self-pay | Admitting: Podiatry

## 2017-05-13 ENCOUNTER — Ambulatory Visit (INDEPENDENT_AMBULATORY_CARE_PROVIDER_SITE_OTHER): Payer: Medicare Other | Admitting: Podiatry

## 2017-05-13 DIAGNOSIS — M79676 Pain in unspecified toe(s): Secondary | ICD-10-CM

## 2017-05-13 DIAGNOSIS — B351 Tinea unguium: Secondary | ICD-10-CM | POA: Diagnosis not present

## 2017-05-13 NOTE — Progress Notes (Signed)
   Subjective:    Patient ID: [name], male    DOB: [birth year], 66 y.o.   MRN: [id number]  HPI    Review of Systems     Objective:   Physical Exam        Assessment & Plan:

## 2017-05-18 ENCOUNTER — Encounter: Payer: Medicare Other | Attending: Internal Medicine | Admitting: Internal Medicine

## 2017-05-18 DIAGNOSIS — E11622 Type 2 diabetes mellitus with other skin ulcer: Secondary | ICD-10-CM | POA: Insufficient documentation

## 2017-05-18 DIAGNOSIS — Z7901 Long term (current) use of anticoagulants: Secondary | ICD-10-CM | POA: Diagnosis not present

## 2017-05-18 DIAGNOSIS — M109 Gout, unspecified: Secondary | ICD-10-CM | POA: Diagnosis not present

## 2017-05-18 DIAGNOSIS — F17210 Nicotine dependence, cigarettes, uncomplicated: Secondary | ICD-10-CM | POA: Insufficient documentation

## 2017-05-18 DIAGNOSIS — I87331 Chronic venous hypertension (idiopathic) with ulcer and inflammation of right lower extremity: Secondary | ICD-10-CM | POA: Diagnosis not present

## 2017-05-18 DIAGNOSIS — Z86718 Personal history of other venous thrombosis and embolism: Secondary | ICD-10-CM | POA: Insufficient documentation

## 2017-05-18 DIAGNOSIS — L97513 Non-pressure chronic ulcer of other part of right foot with necrosis of muscle: Secondary | ICD-10-CM | POA: Insufficient documentation

## 2017-05-18 DIAGNOSIS — L97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: Secondary | ICD-10-CM | POA: Insufficient documentation

## 2017-05-18 DIAGNOSIS — I509 Heart failure, unspecified: Secondary | ICD-10-CM | POA: Insufficient documentation

## 2017-05-18 DIAGNOSIS — I4891 Unspecified atrial fibrillation: Secondary | ICD-10-CM | POA: Insufficient documentation

## 2017-05-18 DIAGNOSIS — Z8673 Personal history of transient ischemic attack (TIA), and cerebral infarction without residual deficits: Secondary | ICD-10-CM | POA: Insufficient documentation

## 2017-05-18 DIAGNOSIS — E1151 Type 2 diabetes mellitus with diabetic peripheral angiopathy without gangrene: Secondary | ICD-10-CM | POA: Insufficient documentation

## 2017-05-18 DIAGNOSIS — I110 Hypertensive heart disease with heart failure: Secondary | ICD-10-CM | POA: Insufficient documentation

## 2017-05-19 NOTE — Progress Notes (Signed)
[name], [name] ([id number]) Visit Report for 05/18/2017 Arrival Information Details Patient Name: [name], [name] Date of Service: 05/18/2017 12:30 PM Medical Record Number: [id number] Patient Account Number: [id number] Date of Birth/Sex: 04/29/1951 (67 y.o. Male) Treating RN: Montey Hora Primary Care Dorn Hartshorne: Royetta Crochet Other Clinician: Referring Aliya Sol: Royetta Crochet Treating Shondrika Hoque/Extender: Tito Dine in Treatment: 3 Visit Information History Since Last Visit Added or deleted any medications: No Patient Arrived: Ambulatory Any new allergies or adverse reactions: No Arrival Time: 12:46 Had a fall or experienced change in No Accompanied By: self activities of daily living that may affect Transfer Assistance: None risk of falls: Patient Identification Verified: Yes Signs or symptoms of abuse/neglect since last visito No Secondary Verification Process Yes Hospitalized since last visit: No Completed: Has Dressing in Place as Prescribed: Yes Patient Requires Transmission-Based No Has Compression in Place as Prescribed: Yes Precautions: Pain Present Now: No Patient Has Alerts: Yes Patient Alerts: Patient on Blood Thinner Coumadin 81mg  Aspirin DM II o Electronic Signature(s) Signed: 05/18/2017 5:10:34 PM By: Montey Hora Entered By: Montey Hora on 05/18/2017 12:46:44 [name], [name] ([id number]) -------------------------------------------------------------------------------- Encounter Discharge Information Details Patient Name: [name] Date of Service: 05/18/2017 12:30 PM Medical Record Number: [id number] Patient Account Number: [id number] Date of Birth/Sex: January 04, 1951 (67 y.o. Male) Treating RN: Montey Hora Primary Care Adom Schoeneck: Royetta Crochet Other Clinician: Referring Lakesia Dahle: Royetta Crochet Treating Rocket Gunderson/Extender: Tito Dine in Treatment: 3 Encounter Discharge Information Items Discharge Pain Level:  0 Discharge Condition: Stable Ambulatory Status: Ambulatory Discharge Destination: Home Private Transportation: Auto Accompanied By: self Schedule Follow-up Appointment: Yes Medication Reconciliation completed and provided No to Patient/Care Nahuel Wilbert: Clinical Summary of Care: Electronic Signature(s) Signed: 05/18/2017 1:53:41 PM By: Montey Hora Entered By: Montey Hora on 05/18/2017 13:53:41 [name], [name] ([id number]) -------------------------------------------------------------------------------- Lower Extremity Assessment Details Patient Name: [name] Date of Service: 05/18/2017 12:30 PM Medical Record Number: [id number] Patient Account Number: [id number] Date of Birth/Sex: August 08, 1950 (67 y.o. Male) Treating RN: Montey Hora Primary Care Pape Parson: Royetta Crochet Other Clinician: Referring Trevaun Rendleman: Royetta Crochet Treating Freddy Spadafora/Extender: Ricard Dillon Weeks in Treatment: 3 Edema Assessment Assessed: [Left: No] [Right: No] E[Left: dema] [Right: :] Calf Left: Right: Point of Measurement: 40 cm From Medial Instep cm 43.6 cm Ankle Left: Right: Point of Measurement: 13 cm From Medial Instep cm 28.7 cm Vascular Assessment Pulses: Dorsalis Pedis Palpable: [Right:Yes] Posterior Tibial Extremity colors, hair growth, and conditions: Extremity Color: [Right:Hyperpigmented] Hair Growth on Extremity: [Right:No] Temperature of Extremity: [Right:Warm] Capillary Refill: [Right:< 3 seconds] Electronic Signature(s) Signed: 05/18/2017 5:10:34 PM By: Montey Hora Entered By: Montey Hora on 05/18/2017 12:53:00 [name], [name] ([id number]) -------------------------------------------------------------------------------- Multi Wound Chart Details Patient Name: [name] Date of Service: 05/18/2017 12:30 PM Medical Record Number: [id number] Patient Account Number: [id number] Date of Birth/Sex: 06-Nov-1950 (67 y.o. 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] [N/A:N/A] Comorbid History: [6:Chronic Obstructive Pulmonary Disease (COPD), Arrhythmia, Deep Vein Thrombosis, Hypertension, Type II Diabetes, Gout, Neuropathy] [N/A:N/A] Date Acquired: [6:04/12/2017] [N/A:N/A] Weeks of Treatment: [6:3] [N/A:N/A] Wound Status: [6:Open] [N/A:N/A] Measurements L x W x D [6:0.8x0.8x0.1] [N/A:N/A] (cm) Area (cm) : [6:0.503] [N/A:N/A] Volume (cm) : [6:0.05] [N/A:N/A] %  Reduction in Area: [6:-1.60%] [N/A:N/A] % Reduction in Volume: [6:49.50%] [N/A:N/A] Classification: [6:Grade 1] [N/A:N/A] Exudate Amount: [6:Medium] [N/A:N/A] Exudate Type: [6:Serous] [N/A:N/A] Exudate Color: [6:amber] [N/A:N/A] Wound Margin: [6:Distinct, outline attached] [N/A:N/A] Granulation Amount: [6:Large (67-100%)] [N/A:N/A] Granulation Quality: [6:Red] [N/A:N/A] Necrotic Amount: [6:Small (1-33%)] [N/A:N/A] Exposed Structures: [6:Fat Layer (Subcutaneous Tissue) Exposed: Yes Fascia: No Tendon: No Muscle: No Joint: No Bone: No] [N/A:N/A] Epithelialization: [6:Medium (34-66%)] [N/A:N/A] Debridement: Debridement ([id number]) N/A N/A Pre-procedure 13:07 N/A N/A Verification/Time Out Taken: Pain Control: Lidocaine 4% Topical Solution N/A N/A Tissue Debrided: Fibrin/Slough, Subcutaneous N/A N/A Level: Skin/Subcutaneous Tissue N/A N/A Debridement Area (sq cm): 0.64 N/A N/A Instrument: Curette N/A N/A Bleeding: Minimum N/A  N/A Hemostasis Achieved: Pressure N/A N/A Procedural Pain: 0 N/A N/A Post Procedural Pain: 0 N/A N/A Debridement Treatment Procedure was tolerated well N/A N/A Response: Post Debridement 0.9x0.9x0.2 N/A N/A Measurements L x W x D (cm) Post Debridement Volume: 0.127 N/A N/A (cm) Periwound Skin Texture: Excoriation: Yes N/A N/A Induration: Yes Callus: No Crepitus: No Rash: No Scarring: No Periwound Skin Moisture: Dry/Scaly: Yes N/A N/A Maceration: No Periwound Skin Color: Atrophie Blanche: No N/A N/A Cyanosis: No Ecchymosis: No Erythema: No Hemosiderin Staining: No Mottled: No Pallor: No Rubor: No Temperature: No Abnormality N/A N/A Tenderness on Palpation: Yes N/A N/A Wound Preparation: Ulcer Cleansing: N/A N/A Rinsed/Irrigated with Saline, Other: soap and water Topical Anesthetic Applied: Other: lidocaine 4% Procedures Performed: Debridement N/A N/A Treatment Notes Electronic Signature(s) Signed: 05/18/2017 5:48:27 PM By: [name] MD Previous Signature: 05/18/2017 12:57:40 PM Version By: Montey Hora Entered By: [name] on 05/18/2017 13:28:05 [name], [name] ([id number]) -------------------------------------------------------------------------------- Multi-Disciplinary Care Plan Details Patient Name: [name] Date of Service: 05/18/2017 12:30 PM Medical Record Number: [id number] Patient Account Number: [id number] Date of Birth/Sex: 1950-06-26 (67 y.o. Male) Treating RN: Montey Hora Primary Care Liseth Wann: Royetta Crochet Other Clinician: Referring Sylvanus Telford: Royetta Crochet Treating Zaydin Billey/Extender: Tito Dine in Treatment: 3 Active Inactive ` Orientation to the Wound Care Program Nursing Diagnoses: Knowledge deficit related to the wound healing center program Goals: Patient/caregiver will verbalize understanding of the [place] Program Date Initiated: 04/26/2017 Target Resolution Date: 05/27/2017 Goal  Status: Active Interventions: Provide education on orientation to the wound center Notes: ` Wound/Skin Impairment Nursing Diagnoses: Impaired tissue integrity Knowledge deficit related to ulceration/compromised skin integrity Goals: Patient/caregiver will verbalize understanding of skin care regimen Date Initiated: 04/26/2017 Target Resolution Date: 05/27/2017 Goal Status: Active Ulcer/skin breakdown will have a volume reduction of 30% by week 4 Date Initiated: 04/26/2017 Target Resolution Date: 05/27/2017 Goal Status: Active Interventions: Assess ulceration(s) every visit Provide education on ulcer and skin care Treatment Activities: Skin care regimen initiated : 04/26/2017 Notes: Electronic Signature(s) Signed: 05/18/2017 12:57:32 PM By: Tamsen Snider, [name] ([id number]) Entered By: Montey Hora on 05/18/2017 12:57:32 [name], [name] ([id number]) -------------------------------------------------------------------------------- Pain Assessment Details Patient Name: [name] Date of Service: 05/18/2017 12:30 PM Medical Record Number: [id number] Patient Account Number: [id number] Date of Birth/Sex: Mar 24, 1951 (67 y.o. Male) Treating RN: Montey Hora Primary Care Wai Minotti: Royetta Crochet Other Clinician: Referring Yer Castello: Royetta Crochet Treating Bindi Klomp/Extender: Ricard Dillon Weeks in Treatment: 3 Active Problems Location of Pain Severity and Description of Pain Patient Has Paino No Site Locations With Dressing Change: Yes Duration of the Pain. Constant / Intermittento Intermittent Pain Management and Medication Current Pain Management: Notes Topical or injectable lidocaine is offered to patient for acute pain when surgical debridement is performed. If needed, Patient is instructed to use over the counter pain medication for the  following 24-48 hours after debridement. Wound care MDs do not prescribed pain medications. Patient has  chronic pain or uncontrolled pain. Patient has been instructed to make an appointment with their Primary Care Physician for pain management. Electronic Signature(s) Signed: 05/18/2017 5:10:34 PM By: Montey Hora Entered By: Montey Hora on 05/18/2017 12:48:52 [name], [name] ([id number]) -------------------------------------------------------------------------------- Patient/Caregiver Education Details Patient Name: [name] Date of Service: 05/18/2017 12:30 PM Medical Record Number: [id number] Patient Account Number: [id number] Date of Birth/Gender: 1950/12/05 (68 y.o. Male) Treating RN: Montey Hora Primary Care Physician: Royetta Crochet Other Clinician: Referring Physician: Royetta Crochet Treating Physician/Extender: Tito Dine in Treatment: 3 Education Assessment Education Provided To: Patient Education Topics Provided Venous: Handouts: Other: leg elevation Methods: Explain/Verbal Responses: State content correctly Electronic Signature(s) Signed: 05/18/2017 5:10:34 PM By: Montey Hora Entered By: Montey Hora on 05/18/2017 13:54:33 [name], [name] ([id number]) -------------------------------------------------------------------------------- Wound Assessment Details Patient Name: [name] Date of Service: 05/18/2017 12:30 PM Medical Record Number: [id number] Patient Account Number: [id number] Date of Birth/Sex: 12/23/1950 (67 y.o. Male) Treating RN: Montey Hora Primary Care Charleton Deyoung: Royetta Crochet Other Clinician: Referring Jermia Rigsby: Royetta Crochet Treating Amarionna Arca/Extender: Ricard Dillon Weeks in Treatment: 3 Wound Status Wound Number: 6 Primary Diabetic Wound/Ulcer of the Lower Extremity Etiology: Wound Location: Right Lower Leg - Anterior, Proximal Wound Open Wounding Event: Gradually Appeared Status: Date Acquired: 04/12/2017 Comorbid Chronic Obstructive Pulmonary Disease Weeks Of Treatment: 3 History: (COPD),  Arrhythmia, Deep Vein Thrombosis, Clustered Wound: No Hypertension, Type II Diabetes, Gout, Neuropathy Photos Photo Uploaded By: Montey Hora on 05/18/2017 13:55:51 Wound Measurements Length: (cm) 0.8 Width: (cm) 0.8 Depth: (cm) 0.1 Area: (cm) 0.503 Volume: (cm) 0.05 % Reduction in Area: -1.6% % Reduction in Volume: 49.5% Epithelialization: Medium (34-66%) Tunneling: No Undermining: No Wound Description Classification: Grade 1 Wound Margin: Distinct, outline attached Exudate Amount: Medium Exudate Type: Serous Exudate Color: amber Foul Odor After Cleansing: No Slough/Fibrino Yes Wound Bed Granulation Amount: Large (67-100%) Exposed Structure Granulation Quality: Red Fascia Exposed: No Necrotic Amount: Small (1-33%) Fat Layer (Subcutaneous Tissue) Exposed: Yes Necrotic Quality: Adherent Slough Tendon Exposed: No Muscle Exposed: No Joint Exposed: No Bone Exposed: No Laursen, Ram ([id number]) Periwound Skin Texture Texture Color No Abnormalities Noted: No No Abnormalities Noted: No Callus: No Atrophie Blanche: No Crepitus: No Cyanosis: No Excoriation: Yes Ecchymosis: No Induration: Yes Erythema: No Rash: No Hemosiderin Staining: No Scarring: No Mottled: No Pallor: No Moisture Rubor: No No Abnormalities Noted: No Dry / Scaly: Yes Temperature / Pain Maceration: No Temperature: No Abnormality Tenderness on Palpation: Yes Wound Preparation Ulcer Cleansing: Rinsed/Irrigated with Saline, Other: soap and water, Topical Anesthetic Applied: Other: lidocaine 4%, Treatment Notes Wound #6 (Right, Proximal, Anterior Lower Leg) 1. Cleansed with: Cleanse wound with antibacterial soap and water 2. Anesthetic Topical Lidocaine 4% cream to wound bed prior to debridement 4. Dressing Applied: Hydrafera Blue 5. Secondary Dressing Applied Dry Gauze Non-Adherent pad 7. Secured with 3 Layer Compression System - Right Lower Extremity Notes TCA cream to  surrounding areas of wounds Electronic Signature(s) Signed: 05/18/2017 12:57:24 PM By: Montey Hora Entered By: Montey Hora on 05/18/2017 12:57:24 Koziol, [name] -------------------------------------------------------------------------------- [place] Details Patient Name: [name] Date of Service: 05/18/2017 12:30 PM Medical Record Number: [id number] Patient Account Number: [id number] Date of Birth/Sex: 1950-06-25 (67 y.o. Male) Treating RN: Montey Hora Primary Care Savaya Hakes: Royetta Crochet Other Clinician: Referring Dyami Umbach: Royetta Crochet Treating Kysen Wetherington/Extender: Ricard Dillon Weeks in Treatment: 3 Vital Signs Time Taken: 12:49 Temperature (F): 98.2 Height (in): 74 Pulse (bpm): 68 Weight (  lbs): 252 Respiratory Rate (breaths/min): 18 Body Mass Index (BMI): 32.4 Blood Pressure (mmHg): 158/82 Reference Range: 80 - 120 mg / dl Electronic Signature(s) Signed: 05/18/2017 5:10:34 PM By: Montey Hora Entered By: Montey Hora on 05/18/2017 12:49:13

## 2017-05-19 NOTE — Progress Notes (Signed)
   SUBJECTIVE Patient with a history of diabetes mellitus presents to office today complaining of elongated, thickened nails. Pain while ambulating in shoes. Patient is unable to trim their own nails.  He also reports an ulceration to the right ankle that is being treated by [facility] wound care clinic.   Past Medical History:  Diagnosis Date  . Atrial fibrillation ([place])   . Cocaine abuse ([place])    + UDS on admission  . Gout current and history of  . History of deep vein thrombosis 2006  . History of tobacco abuse    has smoked for 50 years  . Hypertension   . Pulmonary embolism ([place])   . Stroke [facility])     OBJECTIVE General Patient is awake, alert, and oriented x 3 and in no acute distress. Derm Skin is dry and supple bilateral. Negative open lesions or macerations. Remaining integument unremarkable. Nails are tender, long, thickened and dystrophic with subungual debris, consistent with onychomycosis, 1-5 bilateral. No signs of infection noted. Vasc  DP and PT pedal pulses palpable bilaterally. Temperature gradient within normal limits.  Neuro Epicritic and protective threshold sensation diminished bilaterally.  Musculoskeletal Exam No symptomatic pedal deformities noted bilateral. Muscular strength within normal limits.  ASSESSMENT 1. Diabetes Mellitus w/ peripheral neuropathy 2. Onychomycosis of nail due to dermatophyte bilateral 3. Pain in foot bilateral 4. Right ankle ulcer being treated at [facility] wound care center  PLAN OF CARE 1. Patient evaluated today. 2. Instructed to maintain good pedal hygiene and foot care. Stressed importance of controlling blood sugar.  3. Mechanical debridement of nails 1-5 bilaterally performed using a nail nipper. Filed with dremel without incident.  4. Dressing left intact to right ankle ulcer. 5. Return to clinic in 3 months with Dr. Prudence Davidson.Edrick Kins, DPM [facility]  Dr. Edrick Kins, [address]                                        [place], [place] [address]                Office [phone]  Fax [phone]

## 2017-05-19 NOTE — Progress Notes (Signed)
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 with other skin ulcer Modifier: le Quantity: 1 Physician  Procedures CPT4 Code: [id number] Description: 09326 - WC PHYS SUBQ TISS 20 SQ CM ICD-10 Diagnosis Description 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

## 2017-05-24 ENCOUNTER — Encounter: Payer: Medicare Other | Admitting: Physician Assistant

## 2017-05-24 DIAGNOSIS — E11622 Type 2 diabetes mellitus with other skin ulcer: Secondary | ICD-10-CM | POA: Diagnosis not present

## 2017-05-25 NOTE — Progress Notes (Signed)
[name] ([id number]) Visit Report for 05/24/2017 Arrival Information Details Patient Name: [name] Date of Service: 05/24/2017 8:15 AM Medical Record Number: [id number] Patient Account Number: [id number] Date of Birth/Sex: [birth year] (67 y.o. Male) Treating RN: Roger Shelter Primary Care Stellan Vick: Royetta Crochet Other Clinician: Referring Kmari Halter: Royetta Crochet Treating Agron Swiney/Extender: Melburn Hake, HOYT Weeks in Treatment: 4 Visit Information History Since Last Visit All ordered tests and consults were completed: No Patient Arrived: Ambulatory Added or deleted any medications: No Arrival Time: 08:22 Any new allergies or adverse reactions: No Accompanied By: self Had a fall or experienced change in No Transfer Assistance: None activities of daily living that may affect Patient Identification Verified: Yes risk of falls: Secondary Verification Process Yes Signs or symptoms of abuse/neglect since last visito No Completed: Hospitalized since last visit: No Patient Requires Transmission-Based No Pain Present Now: Yes Precautions: Patient Has Alerts: Yes Patient Alerts: Patient on Blood Thinner Coumadin 81mg  Aspirin DM II o Electronic Signature(s) Signed: 05/24/2017 2:51:30 PM By: Roger Shelter Entered By: Roger Shelter on 05/24/2017 08:23:50 [name], [name] ([id number]) -------------------------------------------------------------------------------- Encounter Discharge Information Details Patient Name: [name] Date of Service: 05/24/2017 8:15 AM Medical Record Number: [id number] Patient Account Number: [id number] Date of Birth/Sex: Feb 05, 1951 (67 y.o. Male) Treating RN: Roger Shelter Primary Care Shirl Weir: Royetta Crochet Other Clinician: Referring Jaileen Janelle: Royetta Crochet Treating Tashanda Fuhrer/Extender: Melburn Hake, HOYT Weeks in Treatment: 4 Encounter Discharge Information Items Discharge Pain Level: 0 Discharge Condition:  Stable Ambulatory Status: Ambulatory Discharge Destination: Home Private Transportation: Auto Accompanied By: self Schedule Follow-up Appointment: Yes Medication Reconciliation completed and No provided to Patient/Care Corwin Kuiken: Clinical Summary of Care: Electronic Signature(s) Signed: 05/24/2017 2:51:30 PM By: Roger Shelter Entered By: Roger Shelter on 05/24/2017 [place], [name] ([id number]) -------------------------------------------------------------------------------- Lower Extremity Assessment Details Patient Name: [name] Date of Service: 05/24/2017 8:15 AM Medical Record Number: [id number] Patient Account Number: [id number] Date of Birth/Sex: [birth year] (67 y.o. Male) Treating RN: Roger Shelter Primary Care Joanann Mies: Royetta Crochet Other Clinician: Referring Suhana Wilner: Royetta Crochet Treating Aren Pryde/Extender: STONE III, HOYT Weeks in Treatment: 4 Edema Assessment Assessed: [Left: No] [Right: No] E[Left: dema] [Right: :] Calf Left: Right: Point of Measurement: 40 cm From Medial Instep cm 45 cm Ankle Left: Right: Point of Measurement: 13 cm From Medial Instep cm 27.7 cm Vascular Assessment Claudication: Claudication Assessment [Right:None] Pulses: Dorsalis Pedis Palpable: [Right:Yes] Posterior Tibial Extremity colors, hair growth, and conditions: Extremity Color: [Right:Normal] Hair Growth on Extremity: [Right:No] Temperature of Extremity: [Right:Cool] Capillary Refill: [Right:> 3 seconds] Toe Nail Assessment Left: Right: Thick: Yes Discolored: Yes Deformed: Yes Improper Length and Hygiene: Yes Electronic Signature(s) Signed: 05/24/2017 2:51:30 PM By: Roger Shelter Entered By: Roger Shelter on 05/24/2017 08:36:16 [name], [name] ([id number]) -------------------------------------------------------------------------------- [place] Details Patient Name: [name] Date of Service: 05/24/2017  8:15 AM Medical Record Number: [id number] Patient Account Number: [id number] Date of Birth/Sex: Sep 23, 1950 (67 y.o. Male) Treating RN: Roger Shelter Primary Care Deshawn Witty: Royetta Crochet Other Clinician: Referring Blyss Lugar: Royetta Crochet Treating Kathrine Rieves/Extender: STONE III, HOYT Weeks in Treatment: 4 Active Inactive ` Orientation to the Wound Care Program Nursing Diagnoses: Knowledge deficit related to the wound healing center program Goals: Patient/caregiver will verbalize understanding of the [place] Program Date Initiated: 04/26/2017 Target Resolution Date: 05/27/2017 Goal Status: Active Interventions: Provide education on orientation to the wound center Notes: ` Wound/Skin Impairment Nursing Diagnoses: Impaired tissue integrity Knowledge deficit related to ulceration/compromised skin integrity Goals: Patient/caregiver will verbalize understanding of skin care regimen Date Initiated: 04/26/2017 Target Resolution Date: 05/27/2017 Goal Status:  Active Ulcer/skin breakdown will have a volume reduction of 30% by week 4 Date Initiated: 04/26/2017 Target Resolution Date: 05/27/2017 Goal Status: Active Interventions: Assess ulceration(s) every visit Provide education on ulcer and skin care Treatment Activities: Skin care regimen initiated : 04/26/2017 Notes: Electronic Signature(s) Signed: 05/24/2017 2:51:30 PM By: [name], [name] ([id number]) Entered By: Roger Shelter on 05/24/2017 08:41:45 [name], [name] ([id number]) -------------------------------------------------------------------------------- Pain Assessment Details Patient Name: [name] Date of Service: 05/24/2017 8:15 AM Medical Record Number: [id number] Patient Account Number: [id number] Date of Birth/Sex: 1950/08/20 (67 y.o. Male) Treating RN: Roger Shelter Primary Care Nature Kueker: Royetta Crochet Other Clinician: Referring Teresha Hanks: Royetta Crochet Treating  [place] Dohmen/Extender: STONE III, HOYT Weeks in Treatment: 4 Active Problems Location of Pain Severity and Description of Pain Patient Has Paino Yes Site Locations Pain Location: Pain in Ulcers Duration of the Pain. Constant / Intermittento Intermittent Rate the pain. Current Pain Level: 4 Character of Pain Describe the Pain: Burning Pain Management and Medication Current Pain Management: Electronic Signature(s) Signed: 05/24/2017 2:51:30 PM By: Roger Shelter Entered By: Roger Shelter on 05/24/2017 08:24:10 Mccambridge, [name] ([id number]) -------------------------------------------------------------------------------- Patient/Caregiver Education Details Patient Name: [name] Date of Service: 05/24/2017 8:15 AM Medical Record Number: [id number] Patient Account Number: [id number] Date of Birth/Gender: [birth year] (67 y.o. Male) Treating RN: Roger Shelter Primary Care Physician: Royetta Crochet Other Clinician: Referring Physician: Royetta Crochet Treating Physician/Extender: Sharalyn Ink in Treatment: 4 Education Assessment Education Provided To: Patient Education Topics Provided Wound/Skin Impairment: Handouts: Caring for Your Ulcer Methods: Explain/Verbal Responses: State content correctly Electronic Signature(s) Signed: 05/24/2017 2:51:30 PM By: Roger Shelter Entered By: Roger Shelter on 05/24/2017 09:00:21 [place], [name] ([id number]) -------------------------------------------------------------------------------- Wound Assessment Details Patient Name: [name] Date of Service: 05/24/2017 8:15 AM Medical Record Number: [id number] Patient Account Number: [id number] Date of Birth/Sex: [birth year] (67 y.o. Male) Treating RN: Roger Shelter Primary Care Indiyah Paone: Royetta Crochet Other Clinician: Referring Quavion Boule: Royetta Crochet Treating Arabela Basaldua/Extender: STONE III, HOYT Weeks in Treatment: 4 Wound Status Wound Number: 6 Primary  Diabetic Wound/Ulcer of the Lower Extremity Etiology: Wound Location: Right Lower Leg - Anterior, Proximal Wound Open Wounding Event: Gradually Appeared Status: Date Acquired: 04/12/2017 Comorbid Chronic Obstructive Pulmonary Disease Weeks Of Treatment: 4 History: (COPD), Arrhythmia, Deep Vein Thrombosis, Clustered Wound: No Hypertension, Type II Diabetes, Gout, Neuropathy Photos Photo Uploaded By: Roger Shelter on 05/24/2017 10:57:40 Wound Measurements Length: (cm) 0.8 Width: (cm) 0.7 Depth: (cm) 0.2 Area: (cm) 0.44 Volume: (cm) 0.088 % Reduction in Area: 11.1% % Reduction in Volume: 11.1% Epithelialization: Medium (34-66%) Tunneling: No Undermining: No Wound Description Classification: Grade 2 Wound Margin: Flat and Intact Exudate Amount: Medium Exudate Type: Serous Exudate Color: amber Foul Odor After Cleansing: No Slough/Fibrino Yes Wound Bed Granulation Amount: Medium (34-66%) Exposed Structure Granulation Quality: Red Fascia Exposed: No Necrotic Amount: Medium (34-66%) Fat Layer (Subcutaneous Tissue) Exposed: Yes Necrotic Quality: Adherent Slough Tendon Exposed: No Muscle Exposed: No Joint Exposed: No Bone Exposed: No Giarrusso, [name] ([id number]) Periwound Skin Texture Texture Color No Abnormalities Noted: No No Abnormalities Noted: No Callus: No Atrophie Blanche: No Crepitus: No Cyanosis: No Excoriation: No Ecchymosis: No Induration: No Erythema: No Rash: No Hemosiderin Staining: No Scarring: Yes Mottled: No Pallor: No Moisture Rubor: No No Abnormalities Noted: No Dry / Scaly: No Temperature / Pain Maceration: No Temperature: No Abnormality Tenderness on Palpation: Yes Wound Preparation Ulcer Cleansing: Rinsed/Irrigated with Saline, Other: soap and water, Topical Anesthetic Applied: Other: lidocaine 4%, Treatment Notes Wound #6 (Right, Proximal, Anterior Lower Leg) 1. Cleansed with: Clean wound with  Normal Saline Cleanse  wound with antibacterial soap and water 2. Anesthetic Topical Lidocaine 4% cream to wound bed prior to debridement 3. Peri-wound Care: Barrier cream 4. Dressing Applied: Hydrafera Blue 5. Secondary Dressing Applied Dry Gauze 7. Secured with 3 Layer Compression System - Right Lower Extremity Electronic Signature(s) Signed: 05/24/2017 2:51:30 PM By: Roger Shelter Entered By: Roger Shelter on 05/24/2017 08:34:01 Snarski, [name] ([id number]) -------------------------------------------------------------------------------- Vitals Details Patient Name: [name] Date of Service: 05/24/2017 8:15 AM Medical Record Number: [id number] Patient Account Number: [id number] Date of Birth/Sex: 12-05-1950 (67 y.o. Male) Treating RN: Roger Shelter Primary Care Marvion Bastidas: Royetta Crochet Other Clinician: Referring Jazmen Lindenbaum: Royetta Crochet Treating Jeramie Scogin/Extender: STONE III, HOYT Weeks in Treatment: 4 Vital Signs Time Taken: 08:24 Temperature (F): 98.9 Height (in): 74 Pulse (bpm): 51 Weight (lbs): 252 Respiratory Rate (breaths/min): 18 Body Mass Index (BMI): 32.4 Blood Pressure (mmHg): 153/81 Reference Range: 80 - 120 mg / dl Electronic Signature(s) Signed: 05/24/2017 2:51:30 PM By: Roger Shelter Entered By: Roger Shelter on 05/24/2017 74:94:49

## 2017-05-25 NOTE — Progress Notes (Signed)
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 (lidocaine 4%). A time out was conducted at 08:43,  prior to the start of the procedure. A Moderate amount of bleeding was controlled with Pressure. The procedure was tolerated well with a pain level of 0 throughout and a pain level of 0 following the procedure. Post Debridement Measurements: 0.8cm length x 0.7cm width  x 0.3cm depth; 0.132cm^3 volume. Character of Wound/Ulcer Post Debridement is stable. Severity of Tissue Post Debridement is: Fat layer exposed. Post procedure Diagnosis Wound #6: Same as Pre-Procedure Plan Wound Cleansing: Wound #6 Right,Proximal,Anterior Lower Leg: Clean wound with Normal Saline. Anesthetic (add to Medication List): Wound #6 Right,Proximal,Anterior Lower Leg: Topical Lidocaine 4% cream applied to wound bed prior to debridement (In Clinic Only). Skin Barriers/Peri-Wound Care: Moisturizing lotion Primary Wound Dressing: Wound #6 Right,Proximal,Anterior Lower Leg: Hydrafera Blue Secondary Dressing: Wound #6 Right,Proximal,Anterior Lower Leg: Non-adherent pad Dressing Change Frequency: Wound #6 Right,Proximal,Anterior Lower Leg: Change dressing every week Follow-up Appointments: Return Appointment in 1 week. Edema Control: 3 Layer Compression System - Right Lower Extremity Additional Orders / Instructions: Wound #6 Right,Proximal,Anterior Lower Leg: More, Alim ([id number]) Increase protein intake. - Vitamin A, C and Zinc At this point I'm gonna recommend that we continue with the Current wound care measures for the next week. If anything worsens or he does not seem to be making progress as we would like I think the next step would be to try Prisma to see if this could be of benefit for him. In the meantime we will see if he continues to make good progress as he has up to this point. Patient is in agreement with the plan. Please see above for specific wound care orders. We will see patient for re-evaluation in 1 week(s) here in the clinic. If anything worsens or changes patient will contact our  office for additional recommendations. Electronic Signature(s) Signed: 05/25/2017 8:32:41 AM By: Worthy Keeler PA-C Entered By: Worthy Keeler on 05/24/2017 08:47:57 [name], [name] ([id number]) -------------------------------------------------------------------------------- ROS/PFSH Details Patient Name: [name] Date of Service: 05/24/2017 8:15 AM Medical Record Number: [id number] Patient Account Number: [id number] Date of Birth/Sex: 1950-07-31 (67 y.o. Male) Treating RN: Roger Shelter Primary Care Provider: Royetta Crochet Other Clinician: Referring Provider: Royetta Crochet Treating Provider/Extender: STONE III, HOYT Weeks in Treatment: 4 Information Obtained From Patient Wound History Do you currently have one or more open woundso Yes How many open wounds do you currently haveo 2 Approximately how long have you had your woundso 2 weeks How have you been treating your wound(s) until nowo vasaline Has your wound(s) ever healed and then re-openedo Yes Have you had any lab work done in the past montho Yes Who ordered the lab work doneo PCP Have you had any tests for circulation on your legso Yes Who ordered the testo [facility] Where was the test doneo [facility] Constitutional Symptoms (General Health) Complaints and Symptoms: Negative for: Fever; Chills Cardiovascular Complaints and Symptoms: Positive for: LE edema Medical History: Positive for: Arrhythmia - a fib; Deep Vein Thrombosis; Hypertension Negative for: Angina; Congestive Heart Failure; Coronary Artery Disease; Hypotension; Myocardial Infarction; Peripheral Arterial Disease; Peripheral Venous Disease; Phlebitis; Vasculitis Eyes Medical History: Negative for: Cataracts; Glaucoma; Optic Neuritis Ear/Nose/Mouth/Throat Medical History: Negative for: Chronic sinus problems/congestion; Middle ear problems Past Medical History Notes: difficulty speaking Hematologic/Lymphatic Medical History: Negative for: Anemia;  Hemophilia; Human Immunodeficiency Virus; Lymphedema; Sickle Cell Disease Respiratory Complaints and Symptoms: No Complaints or Symptoms [name], [name] ([id number]) Medical History: Positive for: Chronic Obstructive Pulmonary Disease (COPD) Negative for: Aspiration; Asthma; Pneumothorax; Sleep Apnea; Tuberculosis Past Medical History Notes: Hospitalized for Pneumonia 02/2017 Gastrointestinal Medical History: Negative for: Cirrhosis ; Colitis; Crohnos; Hepatitis A; Hepatitis B; Hepatitis C Endocrine Medical History: Positive for: Type II Diabetes Negative for: Type I Diabetes Time with diabetes: 1 month Treated with: Diet Blood sugar tested every day: No Genitourinary Medical History: Negative  for: End Stage Renal Disease Immunological Medical History: Negative for: Lupus Erythematosus; Raynaudos; Scleroderma Integumentary (Skin) Medical History: Negative for: History of Burn; History of pressure wounds Musculoskeletal Medical History: Positive for: Gout Negative for: Rheumatoid Arthritis; Osteoarthritis; Osteomyelitis Neurologic Medical History: Positive for: Neuropathy Negative for: Dementia; Quadriplegia; Paraplegia; Seizure Disorder Past Medical History Notes: CVA in 2014 Oncologic Medical History: Negative for: Received Chemotherapy; Received Radiation Psychiatric Complaints and Symptoms: No Complaints or Symptoms [name], [name] ([id number]) Medical History: Negative for: Anorexia/bulimia; Confinement Anxiety Immunizations Pneumococcal Vaccine: Received Pneumococcal Vaccination: Yes Immunization Notes: up to date Implantable Devices Hospitalization / Surgery History Name of Hospital Purpose of Hospitalization/Surgery Date [facility] CVA 05/17/2012 [place] Pneumonia 03/17/2017 Family and Social History Cancer: No; Diabetes: No; Heart Disease: Yes - Mother,Father; Hereditary Spherocytosis: No; Hypertension: Yes - Mother,Father; Stroke: Yes - Father; Thyroid  Problems: No; Tuberculosis: No; Current every day smoker; Marital Status - Single; Alcohol Use: Never; Drug Use: No History; Caffeine Use: Daily; Financial Concerns: No; Food, Clothing or Shelter Needs: No; Support System Lacking: No; Transportation Concerns: No; Advanced Directives: No; Patient does not want information on Advanced Directives Physician Affirmation I have reviewed and agree with the above information. Electronic Signature(s) Signed: 05/24/2017 2:51:30 PM By: Roger Shelter Signed: 05/25/2017 8:32:41 AM By: Worthy Keeler PA-C Entered By: Worthy Keeler on 05/24/2017 08:46:51 [name], [name] ([id number]) -------------------------------------------------------------------------------- SuperBill Details Patient Name: [name] Date of Service: 05/24/2017 Medical Record Number: [id number] Patient Account Number: [id number] Date of Birth/Sex: 12-11-1950 (67 y.o. 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 I87.331 Chronic venous hypertension (idiopathic) with ulcer and inflammation of right lower extremity E11.51 Type 2 diabetes mellitus with diabetic peripheral angiopathy without gangrene Facility Procedures CPT4 Code: [id number] Description: 32023 - DEB SUBQ TISSUE 20 SQ CM/< ICD-10 Diagnosis Description L97.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

## 2017-05-31 ENCOUNTER — Encounter: Payer: Medicare Other | Admitting: Physician Assistant

## 2017-05-31 DIAGNOSIS — E11622 Type 2 diabetes mellitus with other skin ulcer: Secondary | ICD-10-CM | POA: Diagnosis not present

## 2017-06-01 NOTE — Progress Notes (Signed)
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 with Pressure. The procedure was tolerated well with a pain level of 0 throughout and a pain level of 0 following the procedure. Post  Debridement Measurements: 0.5cm length x 0.5cm width x 0.2cm depth; 0.039cm^3 volume. Character of Wound/Ulcer Post Debridement is stable. Severity of Tissue Post Debridement is: Fat layer exposed. Post procedure Diagnosis Wound #6: Same as Pre-Procedure  Plan Wound Cleansing: Wound #6 Right,Proximal,Anterior Lower Leg: Clean wound with Normal Saline. Anesthetic (add to Medication List): Wound #6 Right,Proximal,Anterior Lower Leg: Topical Lidocaine 4% cream applied to wound bed prior to debridement (In Clinic Only). Skin Barriers/Peri-Wound Care: Moisturizing lotion Primary Wound Dressing: Wound #6 Right,Proximal,Anterior Lower Leg: Hydrafera Blue Other: - endoform Secondary Dressing: Wound #6 Right,Proximal,Anterior Lower Leg: Non-adherent pad Dressing Change Frequency: Wound #6 Right,Proximal,Anterior Lower Leg: Change dressing every week Follow-up Appointments: Return Appointment in 1 week. Edema Control: [name], [name] ([id number]) 3 Layer Compression System - Right Lower Extremity Additional Orders / Instructions: Wound #6 Right,Proximal,Anterior Lower Leg: Increase protein intake. - Vitamin A, C and Zinc At this point I'm gonna recommend that we discontinue the [facility] Dressing we will initiate Endoform in its place. We will see how things do following. Patient is in agreement with this plan. Hopefully this will help to stimulate more rapid granulation and subsequent closure. Please see above for specific wound care orders. We will see patient for re-evaluation in 1 week(s) here in the clinic. If anything worsens or changes patient will contact our office for additional recommendations. Electronic Signature(s) Signed: 05/31/2017 4:32:07 PM By: Worthy Keeler PA-C Entered By: Worthy Keeler on 05/31/2017 15:25:04 [name], [name] ([id number]) -------------------------------------------------------------------------------- ROS/PFSH Details Patient Name:  [name] Date of Service: 05/31/2017 8:15 AM Medical Record Number: [id number] Patient Account Number: [id number] Date of Birth/Sex: 20-Apr-1951 (67 y.o. Male) Treating RN: Roger Shelter Primary Care Provider: Royetta Crochet Other Clinician: Referring Provider: Royetta Crochet Treating Provider/Extender: STONE III, HOYT Weeks in Treatment: 5 Information Obtained From Patient Wound History Do you currently have one or more open woundso Yes How many open wounds do you currently haveo 2 Approximately how long have you had your woundso 2 weeks How have you been treating your wound(s) until nowo vasaline Has your wound(s) ever healed and then re-openedo Yes Have you had any lab work done in the past montho Yes Who ordered the lab work doneo PCP Have you had any tests for circulation on your legso Yes Who ordered the testo [facility] Where was the test doneo [facility] Constitutional Symptoms (General Health) Complaints and Symptoms: Negative for: Fever; Chills Cardiovascular Complaints and Symptoms: Positive for: LE edema Medical History: Positive for: Arrhythmia - a fib; Deep Vein Thrombosis; Hypertension Negative for: Angina; Congestive Heart Failure; Coronary Artery Disease; Hypotension; Myocardial Infarction; Peripheral Arterial Disease; Peripheral Venous Disease; Phlebitis; Vasculitis Eyes Medical History: Negative for: Cataracts; Glaucoma; Optic Neuritis Ear/Nose/Mouth/Throat Medical History: Negative for: Chronic sinus problems/congestion; Middle ear problems Past Medical History Notes: difficulty speaking Hematologic/Lymphatic Medical History: Negative for: Anemia; Hemophilia; Human Immunodeficiency Virus; Lymphedema; Sickle Cell Disease Respiratory Complaints and Symptoms: No Complaints or Symptoms [name], [name] ([id number]) Medical History: Positive for: Chronic Obstructive Pulmonary Disease (COPD) Negative for: Aspiration; Asthma; Pneumothorax; Sleep Apnea;  Tuberculosis Past Medical History Notes: Hospitalized for Pneumonia 02/2017 Gastrointestinal Medical History: Negative for: Cirrhosis ; Colitis; Crohnos; Hepatitis A; Hepatitis B; Hepatitis C Endocrine Medical History: Positive for: Type II Diabetes Negative for: Type I Diabetes Time with diabetes: 1 month Treated with: Diet Blood sugar tested every day: No Genitourinary Medical History: Negative for: End Stage Renal Disease Immunological Medical History: Negative for: Lupus Erythematosus; Raynaudos; Scleroderma Integumentary (Skin) Medical History: Negative for: History of Burn; History of pressure wounds Musculoskeletal Medical History: Positive for: Gout Negative for: Rheumatoid Arthritis; Osteoarthritis; Osteomyelitis Neurologic Medical History: Positive for: Neuropathy Negative for: Dementia; Quadriplegia; Paraplegia; Seizure Disorder Past Medical History Notes: CVA in 2014 Oncologic Medical History:  Negative for: Received Chemotherapy; Received Radiation Psychiatric Complaints and Symptoms: No Complaints or Symptoms [name], [name] ([id number]) Medical History: Negative for: Anorexia/bulimia; Confinement Anxiety Immunizations Pneumococcal Vaccine: Received Pneumococcal Vaccination: Yes Immunization Notes: up to date Implantable Devices Hospitalization / Surgery History Name of Hospital Purpose of Hospitalization/Surgery Date [place] CVA 05/17/2012 [facility] Pneumonia 03/17/2017 Family and Social History Cancer: No; Diabetes: No; Heart Disease: Yes - Mother,Father; Hereditary Spherocytosis: No; Hypertension: Yes - Mother,Father; Stroke: Yes - Father; Thyroid Problems: No; Tuberculosis: No; Current every day smoker; Marital Status - Single; Alcohol Use: Never; Drug Use: No History; Caffeine Use: Daily; Financial Concerns: No; Food, Clothing or Shelter Needs: No; Support System Lacking: No; Transportation Concerns: No; Advanced Directives: No; Patient does not  want information on Advanced Directives Physician Affirmation I have reviewed and agree with the above information. Electronic Signature(s) Signed: 05/31/2017 4:11:02 PM By: Roger Shelter Signed: 05/31/2017 4:32:07 PM By: Worthy Keeler PA-C Entered By: Worthy Keeler on 05/31/2017 15:23:25 Vannice, [name] ([id number]) -------------------------------------------------------------------------------- SuperBill Details Patient Name: [name] Date of Service: 05/31/2017 Medical Record Number: [id number] Patient Account Number: [id number] Date of Birth/Sex: 1950/06/23 (67 y.o. 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 I87.331 Chronic venous hypertension (idiopathic) with ulcer and inflammation of right lower extremity E11.51 Type 2 diabetes mellitus with diabetic peripheral angiopathy without gangrene Facility Procedures CPT4 Code: 09233007 Description: 62263 - DEB SUBQ TISSUE 20 SQ CM/< ICD-10 Diagnosis Description L97.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

## 2017-06-01 NOTE — Progress Notes (Signed)
[name] ([id number]) Visit Report for 05/31/2017 Arrival Information Details Patient Name: [name], [name] Date of Service: 05/31/2017 8:15 AM Medical Record Number: [id number] Patient Account Number: [id number] Date of Birth/Sex: Sep 16, 1950 (67 y.o. Male) Treating RN: Roger Shelter Primary Care Mehek Grega: Royetta Crochet Other Clinician: Referring Baby Stairs: Royetta Crochet Treating Feige Lowdermilk/Extender: Melburn Hake, HOYT Weeks in Treatment: 5 Visit Information History Since Last Visit All ordered tests and consults were completed: No Patient Arrived: Ambulatory Added or deleted any medications: No Arrival Time: 08:14 Any new allergies or adverse reactions: No Accompanied By: self Had a fall or experienced change in No Transfer Assistance: None activities of daily living that may affect Patient Identification Verified: Yes risk of falls: Secondary Verification Process Yes Signs or symptoms of abuse/neglect since last visito No Completed: Hospitalized since last visit: No Patient Requires Transmission-Based No Pain Present Now: Yes Precautions: Patient Has Alerts: Yes Patient Alerts: Patient on Blood Thinner Coumadin 81mg  Aspirin DM II o Electronic Signature(s) Signed: 05/31/2017 4:11:02 PM By: Roger Shelter Entered By: Roger Shelter on 05/31/2017 08:15:05 Boggio, [name] ([id number]) -------------------------------------------------------------------------------- Encounter Discharge Information Details Patient Name: [name] Date of Service: 05/31/2017 8:15 AM Medical Record Number: [id number] Patient Account Number: [id number] Date of Birth/Sex: 1950/10/01 (67 y.o. Male) Treating RN: Roger Shelter Primary Care Tyreona Panjwani: Royetta Crochet Other Clinician: Referring Garrett Mitchum: Royetta Crochet Treating Brodan Grewell/Extender: STONE III, HOYT Weeks in Treatment: 5 Encounter Discharge Information Items Discharge Pain Level: 0 Discharge Condition:  Stable Ambulatory Status: Ambulatory Discharge Destination: Home Transportation: Private Auto Accompanied By: self Schedule Follow-up Appointment: Yes Medication Reconciliation completed and No provided to Patient/Care Aarron Wierzbicki: Patient Clinical Summary of Care: Declined Electronic Signature(s) Signed: 05/31/2017 4:11:02 PM By: Roger Shelter Entered By: Roger Shelter on 05/31/2017 09:03:40 Jacque, [name] ([id number]) -------------------------------------------------------------------------------- Lower Extremity Assessment Details Patient Name: [name] Date of Service: 05/31/2017 8:15 AM Medical Record Number: [id number] Patient Account Number: [id number] Date of Birth/Sex: 03/26/1951 (67 y.o. Male) Treating RN: Roger Shelter Primary Care Zaya Kessenich: Royetta Crochet Other Clinician: Referring Mechelle Pates: Royetta Crochet Treating Carmino Ocain/Extender: STONE III, HOYT Weeks in Treatment: 5 Edema Assessment Assessed: [Left: No] [Right: No] E[Left: dema] [Right: :] Calf Left: Right: Point of Measurement: 40 cm From Medial Instep cm 43 cm Ankle Left: Right: Point of Measurement: 13 cm From Medial Instep cm 27.7 cm Vascular Assessment Claudication: Claudication Assessment [Right:None] Pulses: Dorsalis Pedis Palpable: [Right:Yes] Posterior Tibial Extremity colors, hair growth, and conditions: Extremity Color: [Right:Normal] Hair Growth on Extremity: [Right:Yes] Temperature of Extremity: [Right:Cool] Capillary Refill: [Right:> 3 seconds] Toe Nail Assessment Left: Right: Thick: Yes Discolored: Yes Deformed: Yes Improper Length and Hygiene: Yes Electronic Signature(s) Signed: 05/31/2017 4:11:02 PM By: Roger Shelter Entered By: Roger Shelter on 05/31/2017 08:33:26 Wild, [name] ([id number]) -------------------------------------------------------------------------------- Multi Wound Chart Details Patient Name: [name] Date of Service:  05/31/2017 8:15 AM Medical Record Number: [id number] Patient Account Number: [id number] Date of Birth/Sex: [birth year] (67 y.o. Male) Treating RN: Roger Shelter Primary Care Ante Arredondo: Royetta Crochet Other Clinician: Referring Robecca Fulgham: Royetta Crochet Treating Jayda White/Extender: STONE III, HOYT Weeks in Treatment: 5 Vital Signs Height(in): 74 Pulse(bpm): 45 Weight(lbs): 252 Blood Pressure(mmHg): 149/82 Body Mass Index(BMI): 32 Temperature(F): 98.1 Respiratory Rate 18 (breaths/min): Photos: [6:No Photos] [N/A:N/A] Wound Location: [6:Right Lower Leg - Anterior, Proximal] [N/A:N/A] Wounding Event: [6:Gradually Appeared] [N/A:N/A] 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] [N/A:N/A] Comorbid History: [6:Chronic Obstructive Pulmonary Disease (COPD), Arrhythmia, Deep Vein Thrombosis, Hypertension, Type II Diabetes, Gout, Neuropathy] [N/A:N/A] Date Acquired: [6:04/12/2017] [N/A:N/A] Weeks of Treatment: [6:5] [N/A:N/A] Wound Status: [6:Open] [N/A:N/A] Measurements L x  W x D [6:0.5x0.5x0.2] [N/A:N/A] (cm) Area (cm) : [6:0.196] [N/A:N/A] Volume (cm) : [6:0.039] [N/A:N/A] % Reduction in Area: [6:60.40%] [N/A:N/A] % Reduction in Volume: [6:60.60%] [N/A:N/A] Classification: [6:Grade 2] [N/A:N/A] Exudate Amount: [6:Medium] [N/A:N/A] Exudate Type: [6:Serous] [N/A:N/A] Exudate Color: [6:amber] [N/A:N/A] Wound Margin: [6:Flat and Intact] [N/A:N/A] Granulation Amount: [6:Medium (34-66%)] [N/A:N/A] Granulation Quality: [6:Red] [N/A:N/A] Necrotic Amount: [6:Medium (34-66%)] [N/A:N/A] Exposed Structures: [6:Fat Layer (Subcutaneous Tissue) Exposed: Yes Fascia: No Tendon: No Muscle: No Joint: No Bone: No] [N/A:N/A] Epithelialization: [6:Medium (34-66%)] [N/A:N/A] Periwound Skin Texture: Scarring: Yes N/A N/A Excoriation: No Induration: No Callus: No Crepitus: No Rash: No Periwound Skin Moisture: Maceration: No N/A N/A Dry/Scaly: No Periwound Skin  Color: Atrophie Blanche: No N/A N/A Cyanosis: No Ecchymosis: No Erythema: No Hemosiderin Staining: No Mottled: No Pallor: No Rubor: No Temperature: No Abnormality N/A N/A Tenderness on Palpation: Yes N/A N/A Wound Preparation: Ulcer Cleansing: N/A N/A Rinsed/Irrigated with Saline, Other: soap and water Topical Anesthetic Applied: Other: lidocaine 4% Treatment Notes Electronic Signature(s) Signed: 05/31/2017 4:11:02 PM By: Roger Shelter Entered By: Roger Shelter on 05/31/2017 08:33:39 Rainey, [name] ([id number]) -------------------------------------------------------------------------------- [place] Details Patient Name: [name] Date of Service: 05/31/2017 8:15 AM Medical Record Number: [id number] Patient Account Number: [id number] Date of Birth/Sex: March 22, 1951 (67 y.o. Male) Treating RN: Roger Shelter Primary Care Everette Mall: Royetta Crochet Other Clinician: Referring Emmi Wertheim: Royetta Crochet Treating Isael Stille/Extender: STONE III, HOYT Weeks in Treatment: 5 Active Inactive ` Orientation to the Wound Care Program Nursing Diagnoses: Knowledge deficit related to the wound healing center program Goals: Patient/caregiver will verbalize understanding of the [place] Program Date Initiated: 04/26/2017 Target Resolution Date: 05/27/2017 Goal Status: Active Interventions: Provide education on orientation to the wound center Notes: ` Wound/Skin Impairment Nursing Diagnoses: Impaired tissue integrity Knowledge deficit related to ulceration/compromised skin integrity Goals: Patient/caregiver will verbalize understanding of skin care regimen Date Initiated: 04/26/2017 Target Resolution Date: 05/27/2017 Goal Status: Active Ulcer/skin breakdown will have a volume reduction of 30% by week 4 Date Initiated: 04/26/2017 Target Resolution Date: 05/27/2017 Goal Status: Active Interventions: Assess ulceration(s) every  visit Provide education on ulcer and skin care Treatment Activities: Skin care regimen initiated : 04/26/2017 Notes: Electronic Signature(s) Signed: 05/31/2017 4:11:02 PM By: [name], [name] ([id number]) Entered By: Roger Shelter on 05/31/2017 08:33:31 Rathmann, [name] ([id number]) -------------------------------------------------------------------------------- Pain Assessment Details Patient Name: [name] Date of Service: 05/31/2017 8:15 AM Medical Record Number: [id number] Patient Account Number: [id number] Date of Birth/Sex: 08-20-1950 (67 y.o. Male) Treating RN: Roger Shelter Primary Care Lio Wehrly: Royetta Crochet Other Clinician: Referring Jenel Gierke: Royetta Crochet Treating Bush Murdoch/Extender: STONE III, HOYT Weeks in Treatment: 5 Active Problems Location of Pain Severity and Description of Pain Patient Has Paino Yes Site Locations Pain Location: Pain in Ulcers Duration of the Pain. Constant / Intermittento Intermittent Rate the pain. Current Pain Level: 2 Character of Pain Describe the Pain: Burning Pain Management and Medication Current Pain Management: Electronic Signature(s) Signed: 05/31/2017 4:11:02 PM By: Roger Shelter Entered By: Roger Shelter on 05/31/2017 08:15:25 Rinn, [name] ([id number]) -------------------------------------------------------------------------------- Patient/Caregiver Education Details Patient Name: [name] Date of Service: 05/31/2017 8:15 AM Medical Record Number: [id number] Patient Account Number: [id number] Date of Birth/Gender: November 29, 1950 (67 y.o. Male) Treating RN: Roger Shelter Primary Care Physician: Royetta Crochet Other Clinician: Referring Physician: Royetta Crochet Treating Physician/Extender: Sharalyn Ink in Treatment: 5 Education Assessment Education Provided To: Patient Education Topics Provided Wound Debridement: Handouts: Wound Debridement Methods:  Explain/Verbal Responses: State content correctly Wound/Skin Impairment: Handouts: Caring for Your Ulcer Methods: Explain/Verbal Responses: State  content correctly Electronic Signature(s) Signed: 05/31/2017 4:11:02 PM By: Roger Shelter Entered By: Roger Shelter on 05/31/2017 09:03:58 Willison, [name] ([id number]) -------------------------------------------------------------------------------- Wound Assessment Details Patient Name: [name] Date of Service: 05/31/2017 8:15 AM Medical Record Number: [id number] Patient Account Number: [id number] Date of Birth/Sex: [birth year] (67 y.o. Male) Treating RN: Roger Shelter Primary Care Bettyjean Stefanski: Royetta Crochet Other Clinician: Referring Kobey Sides: Royetta Crochet Treating Placido Hangartner/Extender: STONE III, HOYT Weeks in Treatment: 5 Wound Status Wound Number: 6 Primary Diabetic Wound/Ulcer of the Lower Extremity Etiology: Wound Location: Right Lower Leg - Anterior, Proximal Wound Open Wounding Event: Gradually Appeared Status: Date Acquired: 04/12/2017 Comorbid Chronic Obstructive Pulmonary Disease Weeks Of Treatment: 5 History: (COPD), Arrhythmia, Deep Vein Thrombosis, Clustered Wound: No Hypertension, Type II Diabetes, Gout, Neuropathy Photos Photo Uploaded By: Roger Shelter on 05/31/2017 16:12:24 Wound Measurements Length: (cm) 0.5 Width: (cm) 0.5 Depth: (cm) 0.2 Area: (cm) 0.196 Volume: (cm) 0.039 % Reduction in Area: 60.4% % Reduction in Volume: 60.6% Epithelialization: Medium (34-66%) Tunneling: No Undermining: No Wound Description Classification: Grade 2 Wound Margin: Flat and Intact Exudate Amount: Medium Exudate Type: Serous Exudate Color: amber Foul Odor After Cleansing: No Slough/Fibrino Yes Wound Bed Granulation Amount: Medium (34-66%) Exposed Structure Granulation Quality: Red Fascia Exposed: No Necrotic Amount: Medium (34-66%) Fat Layer (Subcutaneous Tissue) Exposed: Yes Necrotic  Quality: Adherent Slough Tendon Exposed: No Muscle Exposed: No Joint Exposed: No Bone Exposed: No Marcella, Isador ([id number]) Periwound Skin Texture Texture Color No Abnormalities Noted: No No Abnormalities Noted: No Callus: No Atrophie Blanche: No Crepitus: No Cyanosis: No Excoriation: No Ecchymosis: No Induration: No Erythema: No Rash: No Hemosiderin Staining: No Scarring: Yes Mottled: No Pallor: No Moisture Rubor: No No Abnormalities Noted: No Dry / Scaly: No Temperature / Pain Maceration: No Temperature: No Abnormality Tenderness on Palpation: Yes Wound Preparation Ulcer Cleansing: Rinsed/Irrigated with Saline, Other: soap and water, Topical Anesthetic Applied: Other: lidocaine 4%, Treatment Notes Wound #6 (Right, Proximal, Anterior Lower Leg) 1. Cleansed with: Clean wound with Normal Saline 2. Anesthetic Topical Lidocaine 4% cream to wound bed prior to debridement 3. Peri-wound Care: Moisturizing lotion 4. Dressing Applied: Other dressing (specify in notes) 5. Secondary Dressing Applied Non-Adherent pad 7. Secured with 3 Layer Compression System - Right Lower Extremity Notes unna boot to anchor Electronic Signature(s) Signed: 05/31/2017 4:11:02 PM By: Roger Shelter Entered By: Roger Shelter on 05/31/2017 08:31:30 Deemer, [name] ([id number]) -------------------------------------------------------------------------------- Vitals Details Patient Name: [name] Date of Service: 05/31/2017 8:15 AM Medical Record Number: [id number] Patient Account Number: [id number] Date of Birth/Sex: [birth year] (67 y.o. Male) Treating RN: Roger Shelter Primary Care Lonney Revak: Royetta Crochet Other Clinician: Referring Natoya Viscomi: Royetta Crochet Treating Mckyla Deckman/Extender: STONE III, HOYT Weeks in Treatment: 5 Vital Signs Time Taken: 08:28 Temperature (F): 98.1 Height (in): 74 Pulse (bpm): 45 Weight (lbs): 252 Respiratory Rate (breaths/min):  18 Body Mass Index (BMI): 32.4 Blood Pressure (mmHg): 149/82 Reference Range: 80 - 120 mg / dl Electronic Signature(s) Signed: 05/31/2017 4:11:02 PM By: Roger Shelter Entered By: Roger Shelter on 05/31/2017 08:28:40

## 2017-06-07 ENCOUNTER — Encounter: Payer: Medicare Other | Admitting: Physician Assistant

## 2017-06-07 DIAGNOSIS — E11622 Type 2 diabetes mellitus with other skin ulcer: Secondary | ICD-10-CM | POA: Diagnosis not present

## 2017-06-08 NOTE — Progress Notes (Signed)
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 Procedures Wound #6 Pre-procedure diagnosis of Wound #6 is a Diabetic Wound/Ulcer of the Lower Extremity located  on the Right,Proximal,Anterior Lower Leg .Severity of Tissue Pre Debridement is: Fat layer exposed. There was a Skin/Subcutaneous Tissue Debridement ([id number]) debridement with total area of 0.42 sq cm performed by STONE III, HOYT E., PA-C. with the following  instrument(s): Curette to remove Viable and Non-Viable tissue/material including Fibrin/Slough and Subcutaneous after achieving pain control using Other (lidocaine 4%). A time out was conducted at 08:48, prior to the start of the procedure. A Minimum amount of bleeding was controlled with Pressure. The procedure was tolerated well with a pain level of 0 throughout and a pain level of 0 following the procedure. Post Debridement Measurements: 0.7cm length x 0.6cm width x 0.3cm depth; 0.099cm^3 volume. Character of Wound/Ulcer Post Debridement is stable. Severity of Tissue Post Debridement is: Fat layer exposed. Post procedure Diagnosis Wound #6: Same as Pre-Procedure Plan Wound Cleansing: Wound #6 Right,Proximal,Anterior Lower Leg: Clean wound with Normal Saline. Anesthetic (add to Medication List): Wound #6 Right,Proximal,Anterior Lower Leg: Topical Lidocaine 4% cream applied to wound bed prior to debridement (In Clinic Only). Skin Barriers/Peri-Wound Care: Moisturizing lotion Primary Wound Dressing: Wound #6 Right,Proximal,Anterior Lower Leg: Other: - endoform Secondary Dressing: Wound #6 Right,Proximal,Anterior Lower Leg: Non-adherent pad Dressing Change Frequency: Wound #6 Right,Proximal,Anterior Lower Leg: Change dressing every week Follow-up Appointments: Return Appointment in 1 week. Edema Control: 3 Layer Compression System - Right Lower Extremity Dlugosz, Daion ([id number]) Additional Orders / Instructions: Wound #6 Right,Proximal,Anterior Lower Leg: Increase protein intake. - Vitamin A, C and Zinc I am going to recommend that we continue with the Current wound care measures for the next week. We will see were  things stand following. Hopefully the Endoform will start to improve the wound little by little. Patient is in agreement with this plan. Please see above for specific wound care orders. We will see patient for re-evaluation in 1 week(s) here in the clinic. If anything worsens or changes patient will contact our office for additional recommendations. Electronic Signature(s) Signed: 06/08/2017 7:56:11 AM By: Worthy Keeler PA-C Entered By: Worthy Keeler on 06/07/2017 09:56:31 Grode, [name] ([id number]) -------------------------------------------------------------------------------- ROS/PFSH Details Patient Name: [name] Date of Service: 06/07/2017 8:15 AM Medical Record Number: [id number] Patient Account Number: [id number] Date of Birth/Sex: 1950-12-07 (67 y.o. Male) Treating RN: Roger Shelter Primary Care Provider: Royetta Crochet Other Clinician: Referring Provider: Royetta Crochet Treating Provider/Extender: Melburn Hake, HOYT Weeks in Treatment: 6 Information Obtained From Patient Wound History Do you currently have one or more open woundso Yes How many open wounds do you currently haveo 2 Approximately how long have you had your woundso 2 weeks How have you been treating your wound(s) until nowo vasaline Has your wound(s) ever healed and then re-openedo Yes Have you had any lab work done in the past montho Yes Who ordered the lab work doneo PCP Have you had any tests for circulation on your legso Yes Who ordered the testo [facility] Where was the test doneo [facility] Constitutional Symptoms (General Health) Complaints and Symptoms: Negative for: Fever; Chills Cardiovascular Complaints and Symptoms: Positive for: LE edema Medical History: Positive for: Arrhythmia - a fib; Deep Vein Thrombosis; Hypertension Negative for: Angina; Congestive Heart Failure; Coronary Artery Disease; Hypotension; Myocardial Infarction; Peripheral Arterial Disease; Peripheral Venous Disease;  Phlebitis; Vasculitis Eyes Medical History: Negative for: Cataracts; Glaucoma; Optic Neuritis Ear/Nose/Mouth/Throat Medical History: Negative for: Chronic sinus problems/congestion; Middle ear problems Past Medical History Notes: difficulty speaking Hematologic/Lymphatic Medical History: Negative for: Anemia; Hemophilia; Human Immunodeficiency Virus; Lymphedema; Sickle Cell Disease Respiratory Complaints and Symptoms: No Complaints or Symptoms Goll, Dorwin ([id number]) Medical History: Positive for: Chronic Obstructive Pulmonary Disease (COPD) Negative for: Aspiration; Asthma; Pneumothorax; Sleep Apnea; Tuberculosis Past Medical History Notes: Hospitalized for Pneumonia 02/2017 Gastrointestinal Medical History: Negative for: Cirrhosis ; Colitis; Crohnos; Hepatitis A;  Hepatitis B; Hepatitis C Endocrine Medical History: Positive for: Type II Diabetes Negative for: Type I Diabetes Time with diabetes: 1 month Treated with: Diet Blood sugar tested every day: No Genitourinary Medical History: Negative for: End Stage Renal Disease Immunological Medical History: Negative for: Lupus Erythematosus; Raynaudos; Scleroderma Integumentary (Skin) Medical History: Negative for: History of Burn; History of pressure wounds Musculoskeletal Medical History: Positive for: Gout Negative for: Rheumatoid Arthritis; Osteoarthritis; Osteomyelitis Neurologic Medical History: Positive for: Neuropathy Negative for: Dementia; Quadriplegia; Paraplegia; Seizure Disorder Past Medical History Notes: CVA in 2014 Oncologic Medical History: Negative for: Received Chemotherapy; Received Radiation Psychiatric Complaints and Symptoms: No Complaints or Symptoms Vohs, Tymier ([id number]) Medical History: Negative for: Anorexia/bulimia; Confinement Anxiety Immunizations Pneumococcal Vaccine: Received Pneumococcal Vaccination: Yes Immunization Notes: up to date Implantable  Devices Hospitalization / Surgery History Name of Hospital Purpose of Hospitalization/Surgery Date [place] CVA 05/17/2012 [place] Pneumonia 03/17/2017 Family and Social History Cancer: No; Diabetes: No; Heart Disease: Yes - Mother,Father; Hereditary Spherocytosis: No; Hypertension: Yes - Mother,Father; Stroke: Yes - Father; Thyroid Problems: No; Tuberculosis: No; Current every day smoker; Marital Status - Single; Alcohol Use: Never; Drug Use: No History; Caffeine Use: Daily; Financial Concerns: No; Food, Clothing or Shelter Needs: No; Support System Lacking: No; Transportation Concerns: No; Advanced Directives: No; Patient does not want information on Advanced Directives Physician Affirmation I have reviewed and agree with the above information. Electronic Signature(s) Signed: 06/07/2017 11:58:22 AM By: Roger Shelter Signed: 06/08/2017 7:56:11 AM By: Worthy Keeler PA-C Entered By: Worthy Keeler on 06/07/2017 09:55:48 Washam, [name] ([id number]) -------------------------------------------------------------------------------- SuperBill Details Patient Name: [name] Date of Service: 06/07/2017 Medical Record Number: [id number] Patient Account Number: [id number] Date of Birth/Sex: 07/26/1950 (67 y.o. 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 venous hypertension (idiopathic) with ulcer and inflammation of right lower extremity E11.51 Type 2 diabetes mellitus with diabetic peripheral angiopathy without gangrene Facility Procedures CPT4 Code:  [id number] Description: 74142 - DEB SUBQ TISSUE 20 SQ CM/< ICD-10 Diagnosis Description 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

## 2017-06-10 NOTE — Progress Notes (Signed)
Mark, [name] ([id number]) Visit Report for 06/07/2017 Arrival Information Details Patient Name: [name] Date of Service: 06/07/2017 8:15 AM Medical Record Number: [id number] Patient Account Number: [id number] Date of Birth/Sex: Oct 10, 1950 (67 y.o. Male) Treating RN: Roger Shelter Primary Care Dashae Wilcher: Royetta Crochet Other Clinician: Referring Jermario Kalmar: Royetta Crochet Treating La Dibella/Extender: Melburn Hake, HOYT Weeks in Treatment: 6 Visit Information History Since Last Visit All ordered tests and consults were completed: No Patient Arrived: Ambulatory Added or deleted any medications: No Arrival Time: 08:10 Any new allergies or adverse reactions: No Accompanied By: self Had a fall or experienced change in No Transfer Assistance: None activities of daily living that may affect Patient Identification Verified: Yes risk of falls: Secondary Verification Process Yes Signs or symptoms of abuse/neglect since last visito No Completed: Hospitalized since last visit: No Patient Requires Transmission-Based No Pain Present Now: Yes Precautions: Patient Has Alerts: Yes Patient Alerts: Patient on Blood Thinner Coumadin 81mg  Aspirin DM II o Electronic Signature(s) Signed: 06/07/2017 11:58:22 AM By: Roger Shelter Entered By: Roger Shelter on 06/07/2017 08:12:13 Memoli, [name] ([id number]) -------------------------------------------------------------------------------- Encounter Discharge Information Details Patient Name: [name] Date of Service: 06/07/2017 8:15 AM Medical Record Number: [id number] Patient Account Number: [id number] Date of Birth/Sex: 09/30/1950 (67 y.o. Male) Treating RN: Roger Shelter Primary Care Michaelann Gunnoe: Royetta Crochet Other Clinician: Referring Yina Riviere: Royetta Crochet Treating Dagny Fiorentino/Extender: Melburn Hake, HOYT Weeks in Treatment: 6 Encounter Discharge Information Items Discharge Pain Level: 0 Discharge Condition:  Stable Ambulatory Status: Ambulatory Discharge Destination: Home Transportation: Private Auto Accompanied By: self Schedule Follow-up Appointment: Yes Medication Reconciliation completed and No provided to Patient/Care Elayna Tobler: Patient Clinical Summary of Care: Declined Electronic Signature(s) Signed: 06/09/2017 11:25:55 AM By: Ruthine Dose Entered By: Ruthine Dose on 06/07/2017 09:06:22 Atkinson, [name] ([id number]) -------------------------------------------------------------------------------- Lower Extremity Assessment Details Patient Name: [name] Date of Service: 06/07/2017 8:15 AM Medical Record Number: [id number] Patient Account Number: [id number] Date of Birth/Sex: [birth year] (67 y.o. Male) Treating RN: Roger Shelter Primary Care Jaylun Fleener: Royetta Crochet Other Clinician: Referring Evian Salguero: Royetta Crochet Treating Kerigan Narvaez/Extender: STONE III, HOYT Weeks in Treatment: 6 Edema Assessment Assessed: [Left: No] [Right: No] Edema: [Left: Ye] [Right: s] Calf Left: Right: Point of Measurement: 40 cm From Medial Instep cm 45 cm Ankle Left: Right: Point of Measurement: 13 cm From Medial Instep cm 27.2 cm Vascular Assessment Claudication: Claudication Assessment [Right:None] Pulses: Dorsalis Pedis Palpable: [Right:Yes] Posterior Tibial Extremity colors, hair growth, and conditions: Extremity Color: [Right:Hyperpigmented] Hair Growth on Extremity: [Right:No] Temperature of Extremity: [Right:Warm] Capillary Refill: [Right:> 3 seconds] Toe Nail Assessment Left: Right: Thick: Yes Discolored: Yes Deformed: Yes Improper Length and Hygiene: Yes Electronic Signature(s) Signed: 06/07/2017 11:58:22 AM By: Roger Shelter Entered By: Roger Shelter on 06/07/2017 08:28:10 Schaus, [name] ([id number]) -------------------------------------------------------------------------------- Multi Wound Chart Details Patient Name: [name] Date of  Service: 06/07/2017 8:15 AM Medical Record Number: [id number] Patient Account Number: [id number] Date of Birth/Sex: [birth year] (67 y.o. Male) Treating RN: Roger Shelter Primary Care Maryln Eastham: Royetta Crochet Other Clinician: Referring Erwin Nishiyama: Royetta Crochet Treating Calisa Luckenbaugh/Extender: STONE III, HOYT Weeks in Treatment: 6 Vital Signs Height(in): 74 Pulse(bpm): 21 Weight(lbs): 252 Blood Pressure(mmHg): 158/92 Body Mass Index(BMI): 32 Temperature(F): 97.5 Respiratory Rate 18 (breaths/min): Photos: [6:No Photos] [N/A:N/A] Wound Location: [6:Right Lower Leg - Anterior, Proximal] [N/A:N/A] Wounding Event: [6:Gradually Appeared] [N/A:N/A] 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] [N/A:N/A] Comorbid History: [6:Chronic Obstructive Pulmonary Disease (COPD), Arrhythmia, Deep Vein Thrombosis, Hypertension, Type II Diabetes, Gout, Neuropathy] [N/A:N/A] Date Acquired: [6:04/12/2017] [N/A:N/A] Weeks of Treatment: [6:6] [N/A:N/A] Wound Status: [6:Open] [N/A:N/A] Measurements L  x W x D [6:0.7x0.6x0.2] [N/A:N/A] (cm) Area (cm) : [6:0.33] [N/A:N/A] Volume (cm) : [6:0.066] [N/A:N/A] % Reduction in Area: [6:33.30%] [N/A:N/A] % Reduction in Volume: [6:33.30%] [N/A:N/A] Classification: [6:Grade 2] [N/A:N/A] Exudate Amount: [6:Medium] [N/A:N/A] Exudate Type: [6:Serous] [N/A:N/A] Exudate Color: [6:amber] [N/A:N/A] Wound Margin: [6:Flat and Intact] [N/A:N/A] Granulation Amount: [6:Medium (34-66%)] [N/A:N/A] Granulation Quality: [6:Red] [N/A:N/A] Necrotic Amount: [6:Medium (34-66%)] [N/A:N/A] Exposed Structures: [6:Fat Layer (Subcutaneous Tissue) Exposed: Yes Fascia: No Tendon: No Muscle: No Joint: No Bone: No] [N/A:N/A] Epithelialization: [6:Small (1-33%)] [N/A:N/A] Periwound Skin Texture: Scarring: Yes N/A N/A Excoriation: No Induration: No Callus: No Crepitus: No Rash: No Periwound Skin Moisture: Maceration: No N/A N/A Dry/Scaly: No Periwound Skin  Color: Atrophie Blanche: No N/A N/A Cyanosis: No Ecchymosis: No Erythema: No Hemosiderin Staining: No Mottled: No Pallor: No Rubor: No Temperature: No Abnormality N/A N/A Tenderness on Palpation: Yes N/A N/A Wound Preparation: Ulcer Cleansing: N/A N/A Rinsed/Irrigated with Saline, Other: soap and water Topical Anesthetic Applied: Other: lidocaine 4% Treatment Notes Electronic Signature(s) Signed: 06/07/2017 11:58:22 AM By: Roger Shelter Entered By: Roger Shelter on 06/07/2017 08:28:26 Axe, [name] ([id number]) -------------------------------------------------------------------------------- [place] Details Patient Name: [name] Date of Service: 06/07/2017 8:15 AM Medical Record Number: [id number] Patient Account Number: [id number] Date of Birth/Sex: 02-26-1951 (67 y.o. Male) Treating RN: Roger Shelter Primary Care [name] Calcaterra: Royetta Crochet Other Clinician: Referring Tiger Spieker: Royetta Crochet Treating Bhakti Labella/Extender: Melburn Hake, HOYT Weeks in Treatment: 6 Active Inactive ` Orientation to the Wound Care Program Nursing Diagnoses: Knowledge deficit related to the wound healing center program Goals: Patient/caregiver will verbalize understanding of the [place] Program Date Initiated: 04/26/2017 Target Resolution Date: 05/27/2017 Goal Status: Active Interventions: Provide education on orientation to the wound center Notes: ` Wound/Skin Impairment Nursing Diagnoses: Impaired tissue integrity Knowledge deficit related to ulceration/compromised skin integrity Goals: Patient/caregiver will verbalize understanding of skin care regimen Date Initiated: 04/26/2017 Target Resolution Date: 05/27/2017 Goal Status: Active Ulcer/skin breakdown will have a volume reduction of 30% by week 4 Date Initiated: 04/26/2017 Target Resolution Date: 05/27/2017 Goal Status: Active Interventions: Assess ulceration(s) every  visit Provide education on ulcer and skin care Treatment Activities: Skin care regimen initiated : 04/26/2017 Notes: Electronic Signature(s) Signed: 06/07/2017 11:58:22 AM By: [name], [name] ([id number]) Entered By: Roger Shelter on 06/07/2017 08:28:15 Gauna, [name] ([id number]) -------------------------------------------------------------------------------- Pain Assessment Details Patient Name: [name] Date of Service: 06/07/2017 8:15 AM Medical Record Number: [id number] Patient Account Number: [id number] Date of Birth/Sex: Sep 24, 1950 (67 y.o. Male) Treating RN: Roger Shelter Primary Care Brynlea Spindler: Royetta Crochet Other Clinician: Referring Burle Kwan: Royetta Crochet Treating Jencarlo Bonadonna/Extender: STONE III, HOYT Weeks in Treatment: 6 Active Problems Location of Pain Severity and Description of Pain Patient Has Paino Yes Site Locations Pain Location: Pain in Ulcers Duration of the Pain. Constant / Intermittento Intermittent Pain Management and Medication Current Pain Management: Notes stinging at sight of wound Electronic Signature(s) Signed: 06/07/2017 11:58:22 AM By: Roger Shelter Entered By: Roger Shelter on 06/07/2017 08:13:29 Stemmler, [name] ([id number]) -------------------------------------------------------------------------------- Patient/Caregiver Education Details Patient Name: [name] Date of Service: 06/07/2017 8:15 AM Medical Record Number: [id number] Patient Account Number: [id number] Date of Birth/Gender: 1951-01-23 (67 y.o. Male) Treating RN: Roger Shelter Primary Care Physician: Royetta Crochet Other Clinician: Referring Physician: Royetta Crochet Treating Physician/Extender: Sharalyn Ink in Treatment: 6 Education Assessment Education Provided To: Patient Education Topics Provided Wound Debridement: Handouts: Wound Debridement Methods: Explain/Verbal Responses: State content  correctly Wound/Skin Impairment: Handouts: Caring for Your Ulcer Methods: Explain/Verbal Responses: State content correctly Electronic Signature(s) Signed: 06/07/2017 11:58:22  AM By: Roger Shelter Entered By: Roger Shelter on 06/07/2017 09:04:59 Hartis, [name] ([id number]) -------------------------------------------------------------------------------- Wound Assessment Details Patient Name: [name] Date of Service: 06/07/2017 8:15 AM Medical Record Number: [id number] Patient Account Number: [id number] Date of Birth/Sex: September 08, 1950 (67 y.o. Male) Treating RN: Roger Shelter Primary Care Gayle Martinez: Royetta Crochet Other Clinician: Referring Karas Pickerill: Royetta Crochet Treating Cornelia Walraven/Extender: STONE III, HOYT Weeks in Treatment: 6 Wound Status Wound Number: 6 Primary Diabetic Wound/Ulcer of the Lower Extremity Etiology: Wound Location: Right Lower Leg - Anterior, Proximal Wound Open Wounding Event: Gradually Appeared Status: Date Acquired: 04/12/2017 Comorbid Chronic Obstructive Pulmonary Disease Weeks Of Treatment: 6 History: (COPD), Arrhythmia, Deep Vein Thrombosis, Clustered Wound: No Hypertension, Type II Diabetes, Gout, Neuropathy Photos Photo Uploaded By: Roger Shelter on 06/07/2017 10:38:09 Wound Measurements Length: (cm) 0.7 Width: (cm) 0.6 Depth: (cm) 0.2 Area: (cm) 0.33 Volume: (cm) 0.066 % Reduction in Area: 33.3% % Reduction in Volume: 33.3% Epithelialization: Small (1-33%) Tunneling: No Undermining: No Wound Description Classification: Grade 2 Wound Margin: Flat and Intact Exudate Amount: Medium Exudate Type: Serous Exudate Color: amber Foul Odor After Cleansing: No Slough/Fibrino Yes Wound Bed Granulation Amount: Medium (34-66%) Exposed Structure Granulation Quality: Red Fascia Exposed: No Necrotic Amount: Medium (34-66%) Fat Layer (Subcutaneous Tissue) Exposed: Yes Necrotic Quality: Adherent Slough Tendon Exposed:  No Muscle Exposed: No Joint Exposed: No Bone Exposed: No Mccartin, Aadhav ([id number]) Periwound Skin Texture Texture Color No Abnormalities Noted: No No Abnormalities Noted: No Callus: No Atrophie Blanche: No Crepitus: No Cyanosis: No Excoriation: No Ecchymosis: No Induration: No Erythema: No Rash: No Hemosiderin Staining: No Scarring: Yes Mottled: No Pallor: No Moisture Rubor: No No Abnormalities Noted: No Dry / Scaly: No Temperature / Pain Maceration: No Temperature: No Abnormality Tenderness on Palpation: Yes Wound Preparation Ulcer Cleansing: Rinsed/Irrigated with Saline, Other: soap and water, Topical Anesthetic Applied: Other: lidocaine 4%, Treatment Notes Wound #6 (Right, Proximal, Anterior Lower Leg) 1. Cleansed with: Clean wound with Normal Saline Cleanse wound with antibacterial soap and water 2. Anesthetic Topical Lidocaine 4% cream to wound bed prior to debridement 4. Dressing Applied: Other dressing (specify in notes) 5. Secondary Dressing Applied Dry Gauze 7. Secured with 3 Layer Compression System - Right Lower Extremity Notes endoform on wound, unna boot to anchor Electronic Signature(s) Signed: 06/07/2017 11:58:22 AM By: Roger Shelter Entered By: Roger Shelter on 06/07/2017 08:25:33 Parmar, [name] ([id number]) -------------------------------------------------------------------------------- Vitals Details Patient Name: [name] Date of Service: 06/07/2017 8:15 AM Medical Record Number: [id number] Patient Account Number: [id number] Date of Birth/Sex: [birth year] (67 y.o. Male) Treating RN: Roger Shelter Primary Care Irina [name]: Royetta Crochet Other Clinician: Referring Fanta Wimberley: Royetta Crochet Treating Shanicqua Coldren/Extender: STONE III, HOYT Weeks in Treatment: 6 Vital Signs Time Taken: 08:15 Temperature (F): 97.5 Height (in): 74 Pulse (bpm): 57 Weight (lbs): 252 Respiratory Rate (breaths/min): 18 Body Mass Index  (BMI): 32.4 Blood Pressure (mmHg): 158/92 Reference Range: 80 - 120 mg / dl Electronic Signature(s) Signed: 06/07/2017 11:58:22 AM By: Roger Shelter Entered By: Roger Shelter on 06/07/2017 08:17:29

## 2017-06-14 ENCOUNTER — Encounter: Payer: Medicare Other | Admitting: Nurse Practitioner

## 2017-06-14 DIAGNOSIS — E11622 Type 2 diabetes mellitus with other skin ulcer: Secondary | ICD-10-CM | POA: Diagnosis not present

## 2017-06-15 NOTE — Progress Notes (Signed)
KALEN, RATAJCZAK ([id number]) Visit Report for 06/14/2017 Arrival Information Details Patient Name: [name], [name] Date of Service: 06/14/2017 8:00 AM Medical Record Number: [id number] Patient Account Number: [id number] Date of Birth/Sex: December 03, 1950 (67 y.o. Male) Treating RN: Roger Shelter Primary Care Akanksha Bellmore: Royetta Crochet Other Clinician: Referring Theona Muhs: Royetta Crochet Treating Carmine Youngberg/Extender: Cathie Olden in Treatment: 7 Visit Information History Since Last Visit All ordered tests and consults were completed: No Patient Arrived: Ambulatory Added or deleted any medications: No Arrival Time: 08:09 Any new allergies or adverse reactions: No Accompanied By: self Had a fall or experienced change in No Transfer Assistance: None activities of daily living that may affect Patient Requires Transmission-Based No risk of falls: Precautions: Signs or symptoms of abuse/neglect since last visito No Patient Has Alerts: Yes Hospitalized since last visit: No Patient Alerts: Patient on Blood Pain Present Now: Yes Thinner Coumadin 81mg  Aspirin DM II o Electronic Signature(s) Signed: 06/14/2017 2:06:29 PM By: Roger Shelter Entered By: Roger Shelter on 06/14/2017 08:10:08 [place], [name] ([id number]) -------------------------------------------------------------------------------- Encounter Discharge Information Details Patient Name: [name] Date of Service: 06/14/2017 8:00 AM Medical Record Number: [id number] Patient Account Number: [id number] Date of Birth/Sex: 08-29-1950 (68 y.o. Male) Treating RN: Roger Shelter Primary Care Xayvier Vallez: Royetta Crochet Other Clinician: Referring Luana Tatro: Royetta Crochet Treating Annice Jolly/Extender: Cathie Olden in Treatment: 7 Encounter Discharge Information Items Discharge Pain Level: 0 Discharge Condition: Stable Ambulatory Status: Ambulatory Discharge Destination: Home Transportation: Private  Auto Accompanied By: self Schedule Follow-up Appointment: Yes Medication Reconciliation completed and No provided to Patient/Care Fergus Throne: Patient Clinical Summary of Care: Declined Electronic Signature(s) Signed: 06/14/2017 2:06:29 PM By: Roger Shelter Entered By: Roger Shelter on 06/14/2017 08:52:21 [name], [name] ([id number]) -------------------------------------------------------------------------------- Lower Extremity Assessment Details Patient Name: [name] Date of Service: 06/14/2017 8:00 AM Medical Record Number: [id number] Patient Account Number: [id number] Date of Birth/Sex: [birth year] (67 y.o. Male) Treating RN: Roger Shelter Primary Care Hershell Brandl: Royetta Crochet Other Clinician: Referring Babbie Dondlinger: Royetta Crochet Treating Kamarii Carton/Extender: Cathie Olden in Treatment: 7 Edema Assessment Assessed: [Left: No] [Right: No] Edema: [Left: N] [Right: o] Calf Left: Right: Point of Measurement: 40 cm From Medial Instep cm 43.8 cm Ankle Left: Right: Point of Measurement: 13 cm From Medial Instep cm 27.6 cm Vascular Assessment Claudication: Claudication Assessment [Right:None] Pulses: Dorsalis Pedis Palpable: [Right:Yes] Posterior Tibial Extremity colors, hair growth, and conditions: Extremity Color: [Right:Hyperpigmented] Hair Growth on Extremity: [Right:No] Temperature of Extremity: [Right:Cool] Capillary Refill: [Right:< 3 seconds] Toe Nail Assessment Left: Right: Thick: Yes Discolored: Yes Deformed: Yes Improper Length and Hygiene: Yes Electronic Signature(s) Signed: 06/14/2017 2:06:29 PM By: Roger Shelter Entered By: Roger Shelter on 06/14/2017 08:27:27 [name], [name] ([id number]) -------------------------------------------------------------------------------- Multi Wound Chart Details Patient Name: [name] Date of Service: 06/14/2017 8:00 AM Medical Record Number: [id number] Patient Account Number:  [id number] Date of Birth/Sex: 04-05-1951 (67 y.o. 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] [N/A:N/A] Comorbid History: [6:Chronic Obstructive Pulmonary Disease (COPD), Arrhythmia, Deep Vein Thrombosis, Hypertension, Type II Diabetes, Gout, Neuropathy] [N/A:N/A] Date Acquired: [6:04/12/2017] [N/A:N/A] Weeks of Treatment: [6:7] [N/A:N/A] Wound Status: [6:Open] [N/A:N/A] Measurements L x W x D [6:0.7x0.5x0.2] [N/A:N/A] (cm) Area (cm) : [6:0.275] [N/A:N/A] Volume (  cm) : [6:0.055] [N/A:N/A] % Reduction in Area: [6:44.40%] [N/A:N/A] % Reduction in Volume: [6:44.40%] [N/A:N/A] Classification: [6:Grade 2] [N/A:N/A] Exudate Amount: [6:Medium] [N/A:N/A] Exudate Type: [6:Serous] [N/A:N/A] Exudate Color: [6:amber] [N/A:N/A] Wound Margin: [6:Flat and Intact] [N/A:N/A] Granulation Amount: [6:Large (67-100%)] [N/A:N/A] Granulation Quality: [6:Red] [N/A:N/A] Necrotic Amount: [6:Small (1-33%)] [N/A:N/A] Exposed Structures: [6:Fat Layer (Subcutaneous Tissue) Exposed: Yes Fascia: No Tendon: No Muscle: No Joint: No Bone: No] [N/A:N/A] Epithelialization: [6:Small (1-33%)] [N/A:N/A] Debridement: Debridement ([id number]) N/A N/A Pre-procedure 08:35 N/A N/A Verification/Time Out Taken: Pain Control: Other N/A N/A Tissue Debrided: Fibrin/Slough, Skin, N/A N/A Subcutaneous Level: Skin/Subcutaneous Tissue N/A N/A Debridement Area (sq cm): 0.35 N/A N/A Instrument:  Curette N/A N/A Bleeding: Minimum N/A N/A Hemostasis Achieved: Pressure N/A N/A Procedural Pain: 0 N/A N/A Post Procedural Pain: 0 N/A N/A Debridement Treatment Procedure was tolerated well N/A N/A Response: Post Debridement 0.7x0.5x0.3 N/A N/A Measurements L x W x D (cm) Post Debridement Volume: 0.082 N/A N/A (cm) Periwound Skin Texture: Scarring: Yes N/A N/A Excoriation: No Induration: No Callus: No Crepitus: No Rash: No Periwound Skin Moisture: Maceration: No N/A N/A Dry/Scaly: No Periwound Skin Color: Atrophie Blanche: No N/A N/A Cyanosis: No Ecchymosis: No Erythema: No Hemosiderin Staining: No Mottled: No Pallor: No Rubor: No Temperature: No Abnormality N/A N/A Tenderness on Palpation: Yes N/A N/A Wound Preparation: Ulcer Cleansing: N/A N/A Rinsed/Irrigated with Saline, Other: soap and water Topical Anesthetic Applied: Other: lidocaine 4% Procedures Performed: Debridement N/A N/A Treatment Notes Electronic Signature(s) Signed: 06/14/2017 4:14:39 PM By: Lawanda Cousins Entered By: Lawanda Cousins on 06/14/2017 08:47:35 [name], [name] ([id number]) -------------------------------------------------------------------------------- Multi-Disciplinary Care Plan Details Patient Name: [name] Date of Service: 06/14/2017 8:00 AM Medical Record Number: [id number] Patient Account Number: [id number] Date of Birth/Sex: [birth year] (67 y.o. Male) Treating RN: Roger Shelter Primary Care Ajane Novella: Royetta Crochet Other Clinician: Referring Marcine Gadway: Royetta Crochet Treating Ceejay Kegley/Extender: Cathie Olden in Treatment: 7 Active Inactive ` Orientation to the Wound Care Program Nursing Diagnoses: Knowledge deficit related to the wound healing center program Goals: Patient/caregiver will verbalize understanding of the [facility] Program Date Initiated: 04/26/2017 Target Resolution Date: 05/27/2017 Goal Status: Active Interventions: Provide  education on orientation to the wound center Notes: ` Wound/Skin Impairment Nursing Diagnoses: Impaired tissue integrity Knowledge deficit related to ulceration/compromised skin integrity Goals: Patient/caregiver will verbalize understanding of skin care regimen Date Initiated: 04/26/2017 Target Resolution Date: 05/27/2017 Goal Status: Active Ulcer/skin breakdown will have a volume reduction of 30% by week 4 Date Initiated: 04/26/2017 Target Resolution Date: 05/27/2017 Goal Status: Active Interventions: Assess ulceration(s) every visit Provide education on ulcer and skin care Treatment Activities: Skin care regimen initiated : 04/26/2017 Notes: Electronic Signature(s) Signed: 06/14/2017 2:06:29 PM By: [name], [name] ([id number]) Entered By: Roger Shelter on 06/14/2017 08:34:18 [name], [name] ([id number]) -------------------------------------------------------------------------------- Pain Assessment Details Patient Name: [name] Date of Service: 06/14/2017 8:00 AM Medical Record Number: [id number] Patient Account Number: [id number] Date of Birth/Sex: 24-May-1950 (67 y.o. Male) Treating RN: Roger Shelter Primary Care Treyce Spillers: Royetta Crochet Other Clinician: Referring Ashlon Lottman: Royetta Crochet Treating Trenton Passow/Extender: Cathie Olden in Treatment: 7 Active Problems Location of Pain Severity and Description of Pain Patient Has Paino Yes Site Locations Pain Location: Generalized Pain With Dressing Change: Yes Duration of the Pain. Constant / Intermittento Intermittent Rate the pain. Current Pain Level: 8 Worst Pain Level: 8 Character of Pain Describe the Pain: Aching, Burning Pain Management and Medication Current Pain Management: Electronic Signature(s) Signed: 06/14/2017 2:06:29 PM By: Roger Shelter Entered By: Roger Shelter on 06/14/2017 08:10:37 [name],  [name]  ([id number]) -------------------------------------------------------------------------------- Patient/Caregiver Education Details Patient Name: [name], [name] Date of Service: 06/14/2017 8:00 AM Medical Record Number: [id number] Patient Account Number: [id number] Date of Birth/Gender: [birth year] (67 y.o. Male) Treating RN: Roger Shelter Primary Care Physician: Royetta Crochet Other Clinician: Referring Physician: Royetta Crochet Treating Physician/Extender: Cathie Olden in Treatment: 7 Education Assessment Education Provided To: Patient Education Topics Provided Wound Debridement: Handouts: Wound Debridement Methods: Explain/Verbal Responses: State content correctly Wound/Skin Impairment: Handouts: Caring for Your Ulcer Methods: Explain/Verbal Responses: State content correctly Electronic Signature(s) Signed: 06/14/2017 2:06:29 PM By: Roger Shelter Entered By: Roger Shelter on 06/14/2017 08:52:39 [name], [name] ([id number]) -------------------------------------------------------------------------------- Wound Assessment Details Patient Name: [name] Date of Service: 06/14/2017 8:00 AM Medical Record Number: [id number] Patient Account Number: [id number] Date of Birth/Sex: 1950-08-24 (67 y.o. Male) Treating RN: Roger Shelter Primary Care Tykira Wachs: Royetta Crochet Other Clinician: Referring Dailen Mcclish: Royetta Crochet Treating Zorian Gunderman/Extender: Cathie Olden in Treatment: 7 Wound Status Wound Number: 6 Primary Diabetic Wound/Ulcer of the Lower Extremity Etiology: Wound Location: Right Lower Leg - Anterior, Proximal Wound Open Wounding Event: Gradually Appeared Status: Date Acquired: 04/12/2017 Comorbid Chronic Obstructive Pulmonary Disease Weeks Of Treatment: 7 History: (COPD), Arrhythmia, Deep Vein Thrombosis, Clustered Wound: No Hypertension, Type II Diabetes, Gout, Neuropathy Photos Photo Uploaded By: Roger Shelter on  06/14/2017 09:06:18 Wound Measurements Length: (cm) 0.7 Width: (cm) 0.5 Depth: (cm) 0.2 Area: (cm) 0.275 Volume: (cm) 0.055 % Reduction in Area: 44.4% % Reduction in Volume: 44.4% Epithelialization: Small (1-33%) Tunneling: No Undermining: No Wound Description Classification: Grade 2 Wound Margin: Flat and Intact Exudate Amount: Medium Exudate Type: Serous Exudate Color: amber Foul Odor After Cleansing: No Slough/Fibrino Yes Wound Bed Granulation Amount: Large (67-100%) Exposed Structure Granulation Quality: Red Fascia Exposed: No Necrotic Amount: Small (1-33%) Fat Layer (Subcutaneous Tissue) Exposed: Yes Necrotic Quality: Adherent Slough Tendon Exposed: No Muscle Exposed: No Joint Exposed: No Bone Exposed: No [name], Mark ([id number]) Periwound Skin Texture Texture Color No Abnormalities Noted: No No Abnormalities Noted: No Callus: No Atrophie Blanche: No Crepitus: No Cyanosis: No Excoriation: No Ecchymosis: No Induration: No Erythema: No Rash: No Hemosiderin Staining: No Scarring: Yes Mottled: No Pallor: No Moisture Rubor: No No Abnormalities Noted: No Dry / Scaly: No Temperature / Pain Maceration: No Temperature: No Abnormality Tenderness on Palpation: Yes Wound Preparation Ulcer Cleansing: Rinsed/Irrigated with Saline, Other: soap and water, Topical Anesthetic Applied: Other: lidocaine 4%, Treatment Notes Wound #6 (Right, Proximal, Anterior Lower Leg) 1. Cleansed with: Clean wound with Normal Saline 2. Anesthetic Topical Lidocaine 4% cream to wound bed prior to debridement 7. Secured with 3 Layer Compression System - Right Lower Extremity Notes endoform on wound, unna boot to anchor Electronic Signature(s) Signed: 06/14/2017 2:06:29 PM By: Roger Shelter Entered By: Roger Shelter on 06/14/2017 08:25:35 [name], [name] ([id number]) -------------------------------------------------------------------------------- Vitals  Details Patient Name: [name] Date of Service: 06/14/2017 8:00 AM Medical Record Number: [id number] Patient Account Number: [id number] Date of Birth/Sex: 08/21/1950 (67 y.o. Male) Treating RN: Roger Shelter Primary Care Irish Breisch: Royetta Crochet Other Clinician: Referring Sondos Wolfman: Royetta Crochet Treating Zayda Angell/Extender: Cathie Olden in Treatment: 7 Vital Signs Time Taken: 08:24 Temperature (F): 97.6 Height (in): 74 Pulse (bpm): 59 Weight (lbs): 252 Respiratory Rate (breaths/min): 18 Body Mass Index (BMI): 32.4 Blood Pressure (mmHg): 147/83 Reference Range: 80 - 120 mg / dl Electronic Signature(s) Signed: 06/14/2017 2:06:29 PM By: Roger Shelter Entered By: Roger Shelter on 06/14/2017 08:24:44

## 2017-06-15 NOTE — Progress Notes (Addendum)
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 Instructions: Wound #6 Right,Proximal,Anterior Lower Leg: Increase protein intake. - Vitamin A, C and Zinc 1. Continue with Endoform and three layer  compression 2. Follow-up next week Electronic Signature(s) Signed: 06/14/2017 4:16:26 PM By: Lawanda Cousins Zwack, [name] ([id number]) Previous Signature: 06/14/2017 4:14:39 PM Version By: Lawanda Cousins Entered By: Lawanda Cousins  on 06/14/2017 16:16:26 Allinson, [name] ([id number]) -------------------------------------------------------------------------------- ROS/PFSH Details Patient Name: [name] Date of Service: 06/14/2017 8:00 AM Medical Record Number: [id number] Patient Account Number: [id number] Date of Birth/Sex: [birth year] (67 y.o. Male) Treating RN: Roger Shelter Primary Care Provider: Royetta Crochet Other Clinician: Referring Provider: Royetta Crochet Treating Provider/Extender: Cathie Olden in Treatment: 7 Information Obtained From Patient Wound History Do you currently have one or more open woundso Yes How many open wounds do you currently haveo 2 Approximately how long have you had your woundso 2 weeks How have you been treating your wound(s) until nowo vasaline Has your wound(s) ever healed and then re-openedo Yes Have you had any lab work done in the past montho Yes Who ordered the lab work doneo PCP Have you had any tests for circulation on your legso Yes Who ordered the testo [facility] Where was the test doneo [facility] Eyes Medical History: Negative for: Cataracts; Glaucoma; Optic Neuritis Ear/Nose/Mouth/Throat Medical History: Negative for: Chronic sinus problems/congestion; Middle ear problems Past Medical History Notes: difficulty speaking Hematologic/Lymphatic Medical History: Negative for: Anemia; Hemophilia; Human Immunodeficiency Virus; Lymphedema; Sickle Cell Disease Respiratory Medical History: Positive for: Chronic Obstructive Pulmonary Disease (COPD) Negative for: Aspiration; Asthma; Pneumothorax; Sleep Apnea; Tuberculosis Past Medical History Notes: Hospitalized for Pneumonia 02/2017 Cardiovascular Medical History: Positive  for: Arrhythmia - a fib; Deep Vein Thrombosis; Hypertension Negative for: Angina; Congestive Heart Failure; Coronary Artery Disease; Hypotension; Myocardial Infarction; Peripheral Arterial Disease; Peripheral Venous Disease; Phlebitis; Vasculitis Gastrointestinal Zepeda, Denali ([id number]) Medical History: Negative for: Cirrhosis ; Colitis; Crohnos; Hepatitis A; Hepatitis B; Hepatitis C Endocrine Medical History: Positive for: Type II Diabetes Negative for: Type I Diabetes Time with diabetes: 1 month Treated with: Diet Blood sugar tested every day: No Genitourinary Medical History: Negative for: End Stage Renal Disease Immunological Medical History: Negative for: Lupus Erythematosus; Raynaudos; Scleroderma Integumentary (Skin) Medical History: Negative for: History of Burn; History of pressure wounds Musculoskeletal Medical History: Positive for: Gout Negative for: Rheumatoid Arthritis; Osteoarthritis; Osteomyelitis Neurologic Medical History: Positive for: Neuropathy Negative for: Dementia; Quadriplegia; Paraplegia; Seizure Disorder Past Medical History Notes: CVA in 2014 Oncologic Medical History: Negative for: Received Chemotherapy; Received Radiation Psychiatric Medical History: Negative for: Anorexia/bulimia; Confinement Anxiety Immunizations Pneumococcal Vaccine: Received Pneumococcal Vaccination: Yes Immunization Notes: up to date Implantable Devices JONATHIN, HEINICKE ([id number]) Hospitalization / Surgery History Name of Hospital Purpose of Hospitalization/Surgery Date [facility] CVA 05/17/2012 [facility] Pneumonia 03/17/2017 Family and Social History Cancer: No; Diabetes: No; Heart Disease: Yes - Mother,Father; Hereditary Spherocytosis: No; Hypertension: Yes - Mother,Father; Stroke: Yes - Father; Thyroid Problems: No; Tuberculosis: No; Current every day smoker; Marital Status - Single; Alcohol Use: Never; Drug Use: No History; Caffeine Use: Daily; Financial  Concerns: No; Food, Clothing or Shelter Needs: No; Support System Lacking: No; Transportation Concerns: No; Advanced Directives: No; Patient does not want information on Advanced Directives Physician Affirmation I have reviewed and agree with the above information. Electronic Signature(s) Signed: 06/14/2017 2:06:29 PM By: Roger Shelter Signed: 06/14/2017 4:14:39 PM By: Lawanda Cousins Entered By: Lawanda Cousins on 06/14/2017 08:49:37 Zaremba, [name] ([id number]) -------------------------------------------------------------------------------- SuperBill Details Patient Name: [name] Date of Service: 06/14/2017 Medical Record Number: [id number] Patient Account Number: [id number] Date of Birth/Sex: January 22, 1951 (67 y.o. 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 I87.331 Chronic venous hypertension (idiopathic) with ulcer and inflammation of right lower extremity E11.51 Type 2 diabetes mellitus with diabetic peripheral angiopathy without gangrene Facility Procedures CPT4: Description Modifier Quantity Code 24469507 11042 - DEB SUBQ TISSUE 20 SQ CM/< 1 ICD-10 Diagnosis Description L97.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 I87.331 Chronic venous hypertension (idiopathic) with ulcer and  inflammation of right lower extremity E11.622 Type 2 diabetes mellitus with other skin ulcer Physician Procedures CPT4: Description Modifier Quantity Code 2257505 18335 - WC PHYS SUBQ TISS 20 SQ CM 1 ICD-10 Diagnosis Description O25.189 Non-pressure chronic ulcer of  right calf with necrosis of muscle I87.331 Chronic venous hypertension (idiopathic) with ulcer and  inflammation of right lower extremity E11.622 Type 2 diabetes mellitus with other skin ulcer Electronic Signature(s) Signed: 06/14/2017 4:14:39 PM By: Lawanda Cousins Entered By: Lawanda Cousins on 06/14/2017 08:50:50

## 2017-06-21 ENCOUNTER — Encounter: Payer: Medicare Other | Attending: Physician Assistant | Admitting: Physician Assistant

## 2017-06-21 DIAGNOSIS — Z86718 Personal history of other venous thrombosis and embolism: Secondary | ICD-10-CM | POA: Diagnosis not present

## 2017-06-21 DIAGNOSIS — M109 Gout, unspecified: Secondary | ICD-10-CM | POA: Diagnosis not present

## 2017-06-21 DIAGNOSIS — Z7901 Long term (current) use of anticoagulants: Secondary | ICD-10-CM | POA: Insufficient documentation

## 2017-06-21 DIAGNOSIS — E1151 Type 2 diabetes mellitus with diabetic peripheral angiopathy without gangrene: Secondary | ICD-10-CM | POA: Diagnosis not present

## 2017-06-21 DIAGNOSIS — I110 Hypertensive heart disease with heart failure: Secondary | ICD-10-CM | POA: Diagnosis not present

## 2017-06-21 DIAGNOSIS — I872 Venous insufficiency (chronic) (peripheral): Secondary | ICD-10-CM | POA: Insufficient documentation

## 2017-06-21 DIAGNOSIS — F17210 Nicotine dependence, cigarettes, uncomplicated: Secondary | ICD-10-CM | POA: Insufficient documentation

## 2017-06-21 DIAGNOSIS — Z86711 Personal history of pulmonary embolism: Secondary | ICD-10-CM | POA: Diagnosis not present

## 2017-06-21 DIAGNOSIS — E11622 Type 2 diabetes mellitus with other skin ulcer: Secondary | ICD-10-CM | POA: Diagnosis not present

## 2017-06-21 DIAGNOSIS — I87331 Chronic venous hypertension (idiopathic) with ulcer and inflammation of right lower extremity: Secondary | ICD-10-CM | POA: Insufficient documentation

## 2017-06-21 DIAGNOSIS — L97513 Non-pressure chronic ulcer of other part of right foot with necrosis of muscle: Secondary | ICD-10-CM | POA: Insufficient documentation

## 2017-06-21 DIAGNOSIS — Z8673 Personal history of transient ischemic attack (TIA), and cerebral infarction without residual deficits: Secondary | ICD-10-CM | POA: Insufficient documentation

## 2017-06-21 DIAGNOSIS — E11621 Type 2 diabetes mellitus with foot ulcer: Secondary | ICD-10-CM | POA: Diagnosis present

## 2017-06-21 DIAGNOSIS — I4891 Unspecified atrial fibrillation: Secondary | ICD-10-CM | POA: Insufficient documentation

## 2017-06-21 DIAGNOSIS — I509 Heart failure, unspecified: Secondary | ICD-10-CM | POA: Insufficient documentation

## 2017-06-21 DIAGNOSIS — L97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: Secondary | ICD-10-CM | POA: Diagnosis not present

## 2017-06-22 NOTE — Progress Notes (Signed)
JAYMESON, MENGEL ([id number]) Visit Report for 06/21/2017 Arrival Information Details Patient Name: [name], [name] Date of Service: 06/21/2017 8:15 AM Medical Record Number: [id number] Patient Account Number: [id number] Date of Birth/Sex: 04-Apr-1951 (67 y.o. Male) Treating RN: Roger Shelter Primary Care Nathen Balaban: Royetta Crochet Other Clinician: Referring Farrell Pantaleo: Royetta Crochet Treating Elisabel Hanover/Extender: Melburn Hake, HOYT Weeks in Treatment: 8 Visit Information History Since Last Visit All ordered tests and consults were completed: No Patient Arrived: Ambulatory Added or deleted any medications: No Arrival Time: 08:23 Any new allergies or adverse reactions: No Accompanied By: self Had a fall or experienced change in No Transfer Assistance: None activities of daily living that may affect Patient Requires Transmission-Based No risk of falls: Precautions: Signs or symptoms of abuse/neglect since last visito No Patient Has Alerts: Yes Pain Present Now: Yes Patient Alerts: Patient on Blood Thinner Coumadin 81mg  Aspirin DM II o Electronic Signature(s) Signed: 06/21/2017 2:14:47 PM By: Roger Shelter Entered By: Roger Shelter on 06/21/2017 08:23:56 [name], [name] ([id number]) -------------------------------------------------------------------------------- Clinic Level of Care Assessment Details Patient Name: [name] Date of Service: 06/21/2017 8:15 AM Medical Record Number: [id number] Patient Account Number: [id number] Date of Birth/Sex: 1951-05-08 (67 y.o. Male) Treating RN: Roger Shelter Primary Care Liora Myles: Royetta Crochet Other Clinician: Referring Surya Folden: Royetta Crochet Treating Baldemar Dady/Extender: STONE III, HOYT Weeks in Treatment: 8 Clinic Level of Care Assessment Items TOOL 4 Quantity Score []  - Use when only an EandM is performed on FOLLOW-UP visit 0 ASSESSMENTS - Nursing Assessment / Reassessment []  - Reassessment of Co-morbidities  (includes updates in patient status) 0 X- 1 5 Reassessment of Adherence to Treatment Plan ASSESSMENTS - Wound and Skin Assessment / Reassessment X - Simple Wound Assessment / Reassessment - one wound 1 5 []  - 0 Complex Wound Assessment / Reassessment - multiple wounds []  - 0 Dermatologic / Skin Assessment (not related to wound area) ASSESSMENTS - Focused Assessment []  - Circumferential Edema Measurements - multi extremities 0 []  - 0 Nutritional Assessment / Counseling / Intervention []  - 0 Lower Extremity Assessment (monofilament, tuning fork, pulses) []  - 0 Peripheral Arterial Disease Assessment (using hand held doppler) ASSESSMENTS - Ostomy and/or Continence Assessment and Care []  - Incontinence Assessment and Management 0 []  - 0 Ostomy Care Assessment and Management (repouching, etc.) PROCESS - Coordination of Care X - Simple Patient / Family Education for ongoing care 1 15 []  - 0 Complex (extensive) Patient / Family Education for ongoing care []  - 0 Staff obtains Programmer, systems, Records, Test Results / Process Orders []  - 0 Staff telephones HHA, Nursing Homes / Clarify orders / etc []  - 0 Routine Transfer to another Facility (non-emergent condition) []  - 0 Routine Hospital Admission (non-emergent condition) []  - 0 New Admissions / Biomedical engineer / Ordering NPWT, Apligraf, etc. []  - 0 Emergency Hospital Admission (emergent condition) X- 1 10 Simple Discharge Coordination [name], [name] ([id number]) []  - 0 Complex (extensive) Discharge Coordination PROCESS - Special Needs []  - Pediatric / Minor Patient Management 0 []  - 0 Isolation Patient Management []  - 0 Hearing / Language / Visual special needs []  - 0 Assessment of Community assistance (transportation, D/C planning, etc.) []  - 0 Additional assistance / Altered mentation []  - 0 Support Surface(s) Assessment (bed, cushion, seat, etc.) INTERVENTIONS - Wound Cleansing / Measurement X - Simple Wound  Cleansing - one wound 1 5 []  - 0 Complex Wound Cleansing - multiple wounds X- 1 5 Wound Imaging (photographs - any number of wounds) []  - 0 Wound Tracing (instead of photographs) X- 1 5  Simple Wound Measurement - one wound []  - 0 Complex Wound Measurement - multiple wounds INTERVENTIONS - Wound Dressings X - Small Wound Dressing one or multiple wounds 1 10 []  - 0 Medium Wound Dressing one or multiple wounds []  - 0 Large Wound Dressing one or multiple wounds []  - 0 Application of Medications - topical []  - 0 Application of Medications - injection INTERVENTIONS - Miscellaneous []  - External ear exam 0 []  - 0 Specimen Collection (cultures, biopsies, blood, body fluids, etc.) []  - 0 Specimen(s) / Culture(s) sent or taken to Lab for analysis []  - 0 Patient Transfer (multiple staff / Civil Service fast streamer / Similar devices) []  - 0 Simple Staple / Suture removal (25 or less) []  - 0 Complex Staple / Suture removal (26 or more) []  - 0 Hypo / Hyperglycemic Management (close monitor of Blood Glucose) []  - 0 Ankle / Brachial Index (ABI) - do not check if billed separately X- 1 5 Vital Signs [name], [name] ([id number]) Has the patient been seen at the hospital within the last three years: Yes Total Score: 65 Level Of Care: New/Established - Level 2 Electronic Signature(s) Signed: 06/21/2017 2:14:47 PM By: Roger Shelter Entered By: Roger Shelter on 06/21/2017 08:57:31 [name], [name] ([id number]) -------------------------------------------------------------------------------- Encounter Discharge Information Details Patient Name: [name] Date of Service: 06/21/2017 8:15 AM Medical Record Number: [id number] Patient Account Number: [id number] Date of Birth/Sex: 06-29-1950 (67 y.o. Male) Treating RN: Roger Shelter Primary Care Abhiram Criado: Royetta Crochet Other Clinician: Referring Francie Keeling: Royetta Crochet Treating Xzavian Semmel/Extender: Melburn Hake, HOYT Weeks in Treatment:  8 Encounter Discharge Information Items Discharge Pain Level: 0 Discharge Condition: Stable Ambulatory Status: Ambulatory Discharge Destination: Home Transportation: Private Auto Accompanied By: self Schedule Follow-up Appointment: Yes Medication Reconciliation completed and No provided to Patient/Care Naethan Bracewell: Patient Clinical Summary of Care: Declined Electronic Signature(s) Signed: 06/21/2017 2:14:47 PM By: Roger Shelter Entered By: Roger Shelter on 06/21/2017 08:59:05 Lapoint, [name] ([id number]) -------------------------------------------------------------------------------- Lower Extremity Assessment Details Patient Name: [name] Date of Service: 06/21/2017 8:15 AM Medical Record Number: [id number] Patient Account Number: [id number] Date of Birth/Sex: [birth year] (67 y.o. Male) Treating RN: Roger Shelter Primary Care Kordae Buonocore: Royetta Crochet Other Clinician: Referring Shawnia Vizcarrondo: Royetta Crochet Treating Xylia Scherger/Extender: STONE III, HOYT Weeks in Treatment: 8 Edema Assessment Assessed: [Left: No] [Right: No] E[Left: dema] [Right: :] Calf Left: Right: Point of Measurement: 40 cm From Medial Instep cm 42.8 cm Ankle Left: Right: Point of Measurement: 13 cm From Medial Instep cm 26.4 cm Vascular Assessment Claudication: Claudication Assessment [Right:None] Pulses: Dorsalis Pedis Palpable: [Right:Yes] Posterior Tibial Extremity colors, hair growth, and conditions: Extremity Color: [Right:Hyperpigmented] Hair Growth on Extremity: [Right:Yes] Temperature of Extremity: [Right:Cool] Capillary Refill: [Right:< 3 seconds] Toe Nail Assessment Left: Right: Thick: Yes Discolored: Yes Deformed: No Improper Length and Hygiene: Yes Electronic Signature(s) Signed: 06/21/2017 2:14:47 PM By: Roger Shelter Entered By: Roger Shelter on 06/21/2017 08:37:42 Laufer, [name]  ([id number]) -------------------------------------------------------------------------------- Multi Wound Chart Details Patient Name: [name] Date of Service: 06/21/2017 8:15 AM Medical Record Number: [id number] Patient Account Number: [id number] Date of Birth/Sex: 12-16-1950 (67 y.o. 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] [N/A:N/A] Comorbid History: [6:Chronic Obstructive Pulmonary  Disease (COPD), Arrhythmia, Deep Vein Thrombosis, Hypertension, Type II Diabetes, Gout, Neuropathy] [N/A:N/A] Date Acquired: [6:04/12/2017] [N/A:N/A] Weeks of Treatment: [6:8] [N/A:N/A] Wound Status: [6:Open] [N/A:N/A] Measurements L x W x D [6:0.5x0.5x0.2] [N/A:N/A] (cm) Area (cm) : [6:0.196] [N/A:N/A] Volume (cm) : [6:0.039] [N/A:N/A] % Reduction in Area: [6:60.40%] [N/A:N/A] % Reduction in Volume: [6:60.60%] [N/A:N/A] Classification: [6:Grade 2] [N/A:N/A] Exudate Amount: [6:Small] [N/A:N/A] Exudate Type: [6:Serous] [N/A:N/A] Exudate Color: [6:amber] [N/A:N/A] Wound Margin: [6:Flat and Intact] [N/A:N/A] Granulation Amount: [6:Medium (34-66%)] [N/A:N/A] Granulation Quality: [6:Red] [N/A:N/A] Necrotic Amount: [6:Medium (34-66%)] [N/A:N/A] Exposed Structures: [6:Fat Layer (Subcutaneous Tissue) Exposed: Yes Fascia: No Tendon: No Muscle: No Joint: No Bone: No] [N/A:N/A] Epithelialization: [6:Small (1-33%)] [N/A:N/A] Periwound Skin Texture: Scarring: Yes  N/A N/A Excoriation: No Induration: No Callus: No Crepitus: No Rash: No Periwound Skin Moisture: Maceration: No N/A N/A Dry/Scaly: No Periwound Skin Color: Atrophie Blanche: No N/A N/A Cyanosis: No Ecchymosis: No Erythema: No Hemosiderin Staining: No Mottled: No Pallor: No Rubor: No Temperature: No Abnormality N/A N/A Tenderness on Palpation: Yes N/A N/A Wound Preparation: Ulcer Cleansing: N/A N/A Rinsed/Irrigated with Saline, Other: soap and water Topical Anesthetic Applied: Other: lidocaine 4% Treatment Notes Electronic Signature(s) Signed: 06/21/2017 2:14:47 PM By: Roger Shelter Entered By: Roger Shelter on 06/21/2017 08:39:56 Rivere, [name] ([id number]) -------------------------------------------------------------------------------- [place] Details Patient Name: [name] Date of Service: 06/21/2017 8:15 AM Medical Record Number: [id number] Patient Account Number: [id number] Date of Birth/Sex: Apr 20, 1951 (67 y.o. Male) Treating RN: Roger Shelter Primary Care Keilany Burnette: Royetta Crochet Other Clinician: Referring Quenesha Douglass: Royetta Crochet Treating Edilia Ghuman/Extender: STONE III, HOYT Weeks in Treatment: 8 Active Inactive ` Orientation to the Wound Care Program Nursing Diagnoses: Knowledge deficit related to the wound healing center program Goals: Patient/caregiver will verbalize understanding of the [facility] Program Date Initiated: 04/26/2017 Target Resolution Date: 05/27/2017 Goal Status: Active Interventions: Provide education on orientation to the wound center Notes: ` Wound/Skin Impairment Nursing Diagnoses: Impaired tissue integrity Knowledge deficit related to ulceration/compromised skin integrity Goals: Patient/caregiver will verbalize understanding of skin care regimen Date Initiated: 04/26/2017 Target Resolution Date: 05/27/2017 Goal Status: Active Ulcer/skin breakdown will have a volume reduction of  30% by week 4 Date Initiated: 04/26/2017 Target Resolution Date: 05/27/2017 Goal Status: Active Interventions: Assess ulceration(s) every visit Provide education on ulcer and skin care Treatment Activities: Skin care regimen initiated : 04/26/2017 Notes: Electronic Signature(s) Signed: 06/21/2017 2:14:47 PM By: [name], [name] ([id number]) Entered By: Roger Shelter on 06/21/2017 08:39:48 Lawther, [name] ([id number]) -------------------------------------------------------------------------------- Pain Assessment Details Patient Name: [name] Date of Service: 06/21/2017 8:15 AM Medical Record Number: [id number] Patient Account Number: [id number] Date of Birth/Sex: 10-Jan-1951 (67 y.o. Male) Treating RN: Roger Shelter Primary Care Reign Bartnick: Royetta Crochet Other Clinician: Referring Indria Bishara: Royetta Crochet Treating Jhovanny Guinta/Extender: STONE III, HOYT Weeks in Treatment: 8 Active Problems Location of Pain Severity and Description of Pain Patient Has Paino Yes Site Locations Pain Location: Pain in Ulcers Duration of the Pain. Constant / Intermittento Intermittent Rate the pain. Current Pain Level: 4 Character of Pain Describe the Pain: Burning Pain Management and Medication Current Pain Management: Electronic Signature(s) Signed: 06/21/2017 2:14:47 PM By: Roger Shelter Entered By: Roger Shelter on 06/21/2017 08:24:25 Math, [name] ([id number]) -------------------------------------------------------------------------------- Patient/Caregiver Education Details Patient Name: [name] Date of Service: 06/21/2017 8:15 AM Medical Record Number: [id number] Patient Account Number: [id number] Date of Birth/Gender: Feb 08, 1951 (67 y.o. Male) Treating RN: Roger Shelter Primary Care Physician: Royetta Crochet Other Clinician: Referring Physician: Royetta Crochet Treating Physician/Extender: Melburn Hake, HOYT Weeks in Treatment:  8 Education Assessment  Education Provided To: Patient Education Topics Provided Wound/Skin Impairment: Handouts: Caring for Your Ulcer Methods: Explain/Verbal Responses: State content correctly Electronic Signature(s) Signed: 06/21/2017 2:14:47 PM By: Roger Shelter Entered By: Roger Shelter on 06/21/2017 08:59:19 Fennelly, [name] ([id number]) -------------------------------------------------------------------------------- Wound Assessment Details Patient Name: [name] Date of Service: 06/21/2017 8:15 AM Medical Record Number: [id number] Patient Account Number: [id number] Date of Birth/Sex: 04-Feb-1951 (67 y.o. Male) Treating RN: Roger Shelter Primary Care Elah Avellino: Royetta Crochet Other Clinician: Referring Delberta Folts: Royetta Crochet Treating Jaklyn Alen/Extender: STONE III, HOYT Weeks in Treatment: 8 Wound Status Wound Number: 6 Primary Diabetic Wound/Ulcer of the Lower Extremity Etiology: Wound Location: Right Lower Leg - Anterior, Proximal Wound Open Wounding Event: Gradually Appeared Status: Date Acquired: 04/12/2017 Comorbid Chronic Obstructive Pulmonary Disease Weeks Of Treatment: 8 History: (COPD), Arrhythmia, Deep Vein Thrombosis, Clustered Wound: No Hypertension, Type II Diabetes, Gout, Neuropathy Photos Photo Uploaded By: Roger Shelter on 06/21/2017 11:31:44 Wound Measurements Length: (cm) 0.5 Width: (cm) 0.5 Depth: (cm) 0.2 Area: (cm) 0.196 Volume: (cm) 0.039 % Reduction in Area: 60.4% % Reduction in Volume: 60.6% Epithelialization: Small (1-33%) Tunneling: No Undermining: No Wound Description Classification: Grade 2 Wound Margin: Flat and Intact Exudate Amount: Small Exudate Type: Serous Exudate Color: amber Foul Odor After Cleansing: No Slough/Fibrino Yes Wound Bed Granulation Amount: Medium (34-66%) Exposed Structure Granulation Quality: Red Fascia Exposed: No Necrotic Amount: Medium (34-66%) Fat Layer (Subcutaneous  Tissue) Exposed: Yes Necrotic Quality: Adherent Slough Tendon Exposed: No Muscle Exposed: No Joint Exposed: No Bone Exposed: No Gustafson, Christopherjame ([id number]) Periwound Skin Texture Texture Color No Abnormalities Noted: No No Abnormalities Noted: No Callus: No Atrophie Blanche: No Crepitus: No Cyanosis: No Excoriation: No Ecchymosis: No Induration: No Erythema: No Rash: No Hemosiderin Staining: No Scarring: Yes Mottled: No Pallor: No Moisture Rubor: No No Abnormalities Noted: No Dry / Scaly: No Temperature / Pain Maceration: No Temperature: No Abnormality Tenderness on Palpation: Yes Wound Preparation Ulcer Cleansing: Rinsed/Irrigated with Saline, Other: soap and water, Topical Anesthetic Applied: Other: lidocaine 4%, Treatment Notes Wound #6 (Right, Proximal, Anterior Lower Leg) 1. Cleansed with: Clean wound with Normal Saline 2. Anesthetic Topical Lidocaine 4% cream to wound bed prior to debridement 3. Peri-wound Care: Barrier cream 4. Dressing Applied: Prisma Ag 5. Secondary Dressing Applied Non-Adherent pad 7. Secured with 3 Layer Compression System - Right Lower Extremity Notes , unna boot to anchor Electronic Signature(s) Signed: 06/21/2017 2:14:47 PM By: Roger Shelter Entered By: Roger Shelter on 06/21/2017 08:35:50 Norwood, [name] ([id number]) -------------------------------------------------------------------------------- Vitals Details Patient Name: [name] Date of Service: 06/21/2017 8:15 AM Medical Record Number: [id number] Patient Account Number: [id number] Date of Birth/Sex: Jan 14, 1951 (67 y.o. Male) Treating RN: Roger Shelter Primary Care Kelsa Jaworowski: Royetta Crochet Other Clinician: Referring Shepard Keltz: Royetta Crochet Treating Anyiah Coverdale/Extender: STONE III, HOYT Weeks in Treatment: 8 Vital Signs Time Taken: 08:24 Temperature (F): 97.5 Height (in): 74 Pulse (bpm): 72 Weight (lbs): 252 Respiratory Rate  (breaths/min): 18 Body Mass Index (BMI): 32.4 Blood Pressure (mmHg): 152/98 Reference Range: 80 - 120 mg / dl Electronic Signature(s) Signed: 06/21/2017 2:14:47 PM By: Roger Shelter Entered By: Roger Shelter on 06/21/2017 08:24:49

## 2017-06-22 NOTE — Progress Notes (Signed)
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 wound margin is  flat and intact. There is medium (34-66%) red granulation within the wound bed. There is a medium (34-66%) amount of necrotic tissue within the wound bed including Adherent Slough. The periwound skin appearance exhibited: Scarring. The periwound skin appearance did not exhibit: Callus, Crepitus, Excoriation, Induration, Rash, Dry/Scaly, Maceration, Atrophie Blanche, Cyanosis, Ecchymosis, Hemosiderin Staining, Mottled,  Pallor, Rubor, Erythema. Periwound temperature was noted as No Abnormality. The periwound has tenderness on palpation. 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 Anesthetic (add to Medication List): Wound #6 Right,Proximal,Anterior Lower Leg: Topical Lidocaine 4% cream applied to wound bed prior to debridement (In Clinic Only). Skin Barriers/Peri-Wound Care: Moisturizing lotion Primary Wound Dressing: Wound #6 Right,Proximal,Anterior Lower Leg: Prisma Ag Secondary Dressing: Wound #6 Right,Proximal,Anterior Lower Leg: Non-adherent pad Dressing Change Frequency: Wound #6 Right,Proximal,Anterior Lower Leg: Change dressing every week Follow-up Appointments: Return Appointment in 1 week. Edema Control: 3 Layer Compression System - Right Lower Extremity Additional Orders / Instructions: Wound #6 Right,Proximal,Anterior Lower Leg: Increase protein intake. - Vitamin A, C and Zinc [name], [name] ([id number]) At this point I'm going to recommend that we continue with the compression wraps although we will switch back to the silver collagen dressing to see how this will do at this point. Hopefully we'll see some improvement if not we may need to consider something else going forward. We will see were things stand in one weeks time. Please see above for specific wound care orders. We will see patient for re-evaluation in 1 week(s) here in the clinic. If anything worsens or changes patient will  contact our office for additional recommendations. Electronic Signature(s) Signed: 06/21/2017 1:41:25 PM By: Worthy Keeler PA-C Entered By: Worthy Keeler on 06/21/2017 08:50:54 Beasley, [name] ([id number]) -------------------------------------------------------------------------------- ROS/PFSH Details Patient Name: [name] Date of Service: 06/21/2017 8:15 AM Medical Record Number: [id number] Patient Account Number: [id number] Date of Birth/Sex: [birth year] (67 y.o. Male) Treating RN: Roger Shelter Primary Care Provider: Royetta Crochet Other Clinician: Referring Provider: Royetta Crochet Treating Provider/Extender: STONE III, HOYT Weeks in Treatment: 8 Information Obtained From Patient Wound History Do you currently have one or more open woundso Yes How many open wounds do you currently haveo 2 Approximately how long have you had your woundso 2 weeks How have you been treating your wound(s) until nowo vasaline Has your wound(s) ever healed and then re-openedo Yes Have you had any lab work done in the past montho Yes Who ordered the lab work doneo PCP Have you had any tests for circulation on your legso Yes Who ordered the testo [facility] Where was the test doneo [facility] Constitutional Symptoms (General Health) Complaints and Symptoms: Negative for: Fever; Chills Cardiovascular Complaints and Symptoms: Positive for: LE edema Medical History: Positive for: Arrhythmia - a fib; Deep Vein Thrombosis; Hypertension Negative for: Angina; Congestive Heart Failure; Coronary Artery Disease; Hypotension; Myocardial Infarction; Peripheral Arterial Disease; Peripheral Venous Disease; Phlebitis; Vasculitis Integumentary (Skin) Complaints and Symptoms: Positive for: Wounds Medical History: Negative for: History of Burn; History of pressure wounds Eyes Medical History: Negative for: Cataracts; Glaucoma; Optic Neuritis Ear/Nose/Mouth/Throat Medical History: Negative for: Chronic  sinus problems/congestion; Middle ear problems Past Medical History Notes: difficulty speaking DEVARIOUS, PAVEK ([id number]) Hematologic/Lymphatic Medical History: Negative for: Anemia; Hemophilia; Human Immunodeficiency Virus; Lymphedema; Sickle Cell Disease Respiratory Complaints and Symptoms: No Complaints or Symptoms Medical History: Positive for: Chronic Obstructive Pulmonary Disease (COPD) Negative for: Aspiration; Asthma; Pneumothorax; Sleep Apnea; Tuberculosis Past Medical History Notes: Hospitalized for Pneumonia 02/2017 Gastrointestinal Medical History: Negative for: Cirrhosis ; Colitis; Crohnos; Hepatitis A; Hepatitis B; Hepatitis C Endocrine Medical History: Positive for: Type II Diabetes Negative for: Type I Diabetes Time with diabetes: 1 month Treated with: Diet Blood sugar tested every day: No Genitourinary Medical History: Negative for: End Stage Renal Disease Immunological Medical History: Negative for: Lupus Erythematosus; Raynaudos; Scleroderma Musculoskeletal Medical History: Positive for: Gout Negative for: Rheumatoid Arthritis; Osteoarthritis; Osteomyelitis Neurologic Medical History: Positive for: Neuropathy Negative for: Dementia; Quadriplegia; Paraplegia; Seizure Disorder Past Medical History Notes: CVA in 2014 Oncologic Medical History: Negative for: Received Chemotherapy; Received Radiation RONI, SCOW ([id number]) Psychiatric Complaints and Symptoms: No Complaints or Symptoms Medical History: Negative for: Anorexia/bulimia; Confinement Anxiety Immunizations Pneumococcal Vaccine: Received Pneumococcal Vaccination: Yes Immunization Notes: up to date Implantable Devices Hospitalization / Surgery History Name of Hospital Purpose of Hospitalization/Surgery Date [name] CV[place] 05/17/2012 [place] Pneumonia 03/17/2017 Family and Social History Cancer: No; Diabetes: No; Heart Disease: Yes - Mother,Father; Hereditary Spherocytosis: No;  Hypertension: Yes - Mother,Father; Stroke: Yes - Father; Thyroid Problems: No; Tuberculosis: No; Current every day smoker; Marital Status - Single; Alcohol Use: Never; Drug Use: No History; Caffeine Use: Daily; Financial Concerns: No; Food, Clothing or Shelter Needs: No; Support System Lacking: No; Transportation Concerns: No; Advanced Directives: No; Patient does not want information on Advanced Directives Physician Affirmation I have reviewed and agree with the above information. Electronic Signature(s) Signed: 06/21/2017 1:41:25 PM By: Worthy Keeler PA-C Signed: 06/21/2017 2:14:47 PM By: Roger Shelter Entered By: Worthy Keeler on 06/21/2017 08:48:37 Hugh, [name] ([id number]) -------------------------------------------------------------------------------- SuperBill Details Patient Name: [name] Date of Service: 06/21/2017 Medical Record Number: [id number] Patient Account Number: [id number] Date of Birth/Sex: [birth year] (67 y.o. 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 I87.331 Chronic venous hypertension (idiopathic) with ulcer and inflammation of right lower extremity E11.51 Type 2 diabetes mellitus with diabetic peripheral angiopathy without gangrene Physician Procedures CPT4: Description Modifier Quantity Code [id number] - WC PHYS LEVEL 3 - EST PT 1 ICD-10 Diagnosis Description E11.622 Type 2 diabetes mellitus with other skin ulcer L97.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 L97.513  Non-pressure chronic ulcer of other part of  right foot with necrosis of muscle I87.331 Chronic venous hypertension (idiopathic) with ulcer and inflammation of right lower extremity Electronic Signature(s) Signed: 06/21/2017 1:41:25 PM By: Worthy Keeler PA-C Signed: 06/21/2017 2:14:47 PM By: Roger Shelter Entered By: Roger Shelter on 06/21/2017 08:57:40

## 2017-06-28 ENCOUNTER — Encounter: Payer: Medicare Other | Admitting: Physician Assistant

## 2017-06-28 DIAGNOSIS — E11621 Type 2 diabetes mellitus with foot ulcer: Secondary | ICD-10-CM | POA: Diagnosis not present

## 2017-06-29 NOTE — Progress Notes (Signed)
[name], [name] ([id number]) Visit Report for 06/28/2017 Arrival Information Details Patient Name: [name], [name] Date of Service: 06/28/2017 8:15 AM Medical Record Number: [id number] Patient Account Number: [id number] Date of Birth/Sex: 10-17-1950 (67 y.o. Male) Treating RN: Roger Shelter Primary Care Cait Locust: Royetta Crochet Other Clinician: Referring Sharvi Mooneyhan: Royetta Crochet Treating Kastiel Simonian/Extender: Melburn Hake, HOYT Weeks in Treatment: 9 Visit Information History Since Last Visit All ordered tests and consults were completed: No Patient Arrived: Ambulatory Added or deleted any medications: No Arrival Time: 08:14 Any new allergies or adverse reactions: No Accompanied By: self Had a fall or experienced change in No Transfer Assistance: None activities of daily living that may affect Patient Identification Verified: Yes risk of falls: Secondary Verification Process Yes Signs or symptoms of abuse/neglect since last visito No Completed: Hospitalized since last visit: No Patient Requires Transmission-Based No Pain Present Now: Yes Precautions: Patient Has Alerts: Yes Patient Alerts: Patient on Blood Thinner Coumadin 81mg  Aspirin DM II o Electronic Signature(s) Signed: 06/28/2017 4:28:38 PM By: Roger Shelter Entered By: Roger Shelter on 06/28/2017 08:14:43 Schirmer, [name] ([id number]) -------------------------------------------------------------------------------- Clinic Level of Care Assessment Details Patient Name: [name] Date of Service: 06/28/2017 8:15 AM Medical Record Number: [id number] Patient Account Number: [id number] Date of Birth/Sex: 1950-10-25 (67 y.o. Male) Treating RN: Roger Shelter Primary Care Janai Maudlin: Royetta Crochet Other Clinician: Referring Arnie Maiolo: Royetta Crochet Treating Vinay Ertl/Extender: STONE III, HOYT Weeks in Treatment: 9 Clinic Level of Care Assessment Items TOOL 4 Quantity Score []  - Use when only an EandM  is performed on FOLLOW-UP visit 0 ASSESSMENTS - Nursing Assessment / Reassessment []  - Reassessment of Co-morbidities (includes updates in patient status) 0 X- 1 5 Reassessment of Adherence to Treatment Plan ASSESSMENTS - Wound and Skin Assessment / Reassessment X - Simple Wound Assessment / Reassessment - one wound 1 5 []  - 0 Complex Wound Assessment / Reassessment - multiple wounds []  - 0 Dermatologic / Skin Assessment (not related to wound area) ASSESSMENTS - Focused Assessment []  - Circumferential Edema Measurements - multi extremities 0 []  - 0 Nutritional Assessment / Counseling / Intervention []  - 0 Lower Extremity Assessment (monofilament, tuning fork, pulses) []  - 0 Peripheral Arterial Disease Assessment (using hand held doppler) ASSESSMENTS - Ostomy and/or Continence Assessment and Care []  - Incontinence Assessment and Management 0 []  - 0 Ostomy Care Assessment and Management (repouching, etc.) PROCESS - Coordination of Care X - Simple Patient / Family Education for ongoing care 1 15 []  - 0 Complex (extensive) Patient / Family Education for ongoing care []  - 0 Staff obtains Programmer, systems, Records, Test Results / Process Orders []  - 0 Staff telephones HHA, Nursing Homes / Clarify orders / etc []  - 0 Routine Transfer to another Facility (non-emergent condition) []  - 0 Routine Hospital Admission (non-emergent condition) []  - 0 New Admissions / Biomedical engineer / Ordering NPWT, Apligraf, etc. []  - 0 Emergency Hospital Admission (emergent condition) X- 1 10 Simple Discharge Coordination [name], [name] ([id number]) []  - 0 Complex (extensive) Discharge Coordination PROCESS - Special Needs []  - Pediatric / Minor Patient Management 0 []  - 0 Isolation Patient Management []  - 0 Hearing / Language / Visual special needs []  - 0 Assessment of Community assistance (transportation, D/C planning, etc.) []  - 0 Additional assistance / Altered mentation []  -  0 Support Surface(s) Assessment (bed, cushion, seat, etc.) INTERVENTIONS - Wound Cleansing / Measurement X - Simple Wound Cleansing - one wound 1 5 []  - 0 Complex Wound Cleansing - multiple wounds X- 1 5 Wound Imaging (photographs - any  number of wounds) []  - 0 Wound Tracing (instead of photographs) X- 1 5 Simple Wound Measurement - one wound []  - 0 Complex Wound Measurement - multiple wounds INTERVENTIONS - Wound Dressings X - Small Wound Dressing one or multiple wounds 1 10 []  - 0 Medium Wound Dressing one or multiple wounds []  - 0 Large Wound Dressing one or multiple wounds []  - 0 Application of Medications - topical []  - 0 Application of Medications - injection INTERVENTIONS - Miscellaneous []  - External ear exam 0 []  - 0 Specimen Collection (cultures, biopsies, blood, body fluids, etc.) []  - 0 Specimen(s) / Culture(s) sent or taken to Lab for analysis []  - 0 Patient Transfer (multiple staff / Civil Service fast streamer / Similar devices) []  - 0 Simple Staple / Suture removal (25 or less) []  - 0 Complex Staple / Suture removal (26 or more) []  - 0 Hypo / Hyperglycemic Management (close monitor of Blood Glucose) []  - 0 Ankle / Brachial Index (ABI) - do not check if billed separately X- 1 5 Vital Signs [name], [name] ([id number]) Has the patient been seen at the hospital within the last three years: Yes Total Score: 65 Level Of Care: New/Established - Level 2 Electronic Signature(s) Signed: 06/28/2017 4:28:38 PM By: Roger Shelter Entered By: Roger Shelter on 06/28/2017 08:49:36 Gutter, [name] ([id number]) -------------------------------------------------------------------------------- Encounter Discharge Information Details Patient Name: [name] Date of Service: 06/28/2017 8:15 AM Medical Record Number: [id number] Patient Account Number: [id number] Date of Birth/Sex: Oct 11, 1950 (67 y.o. Male) Treating RN: Roger Shelter Primary Care Marlon Suleiman: Royetta Crochet Other Clinician: Referring Bunny Lowdermilk: Royetta Crochet Treating Nedra Mcinnis/Extender: STONE III, HOYT Weeks in Treatment: 9 Encounter Discharge Information Items Discharge Pain Level: 0 Discharge Condition: Stable Ambulatory Status: Ambulatory Discharge Destination: Home Transportation: Private Auto Accompanied By: self Schedule Follow-up Appointment: Yes Medication Reconciliation completed and No provided to Patient/Care [name]: Patient Clinical Summary of Care: Declined Electronic Signature(s) Signed: 06/28/2017 4:30:32 PM By: Ruthine Dose Entered By: Ruthine Dose on 06/28/2017 09:06:06 Coronado, [name] ([id number]) -------------------------------------------------------------------------------- Lower Extremity Assessment Details Patient Name: [name] Date of Service: 06/28/2017 8:15 AM Medical Record Number: [id number] Patient Account Number: [id number] Date of Birth/Sex: 06-24-1950 (67 y.o. Male) Treating RN: Roger Shelter Primary Care Leylanie Woodmansee: Royetta Crochet Other Clinician: Referring Delphia Kaylor: Royetta Crochet Treating Armilda Vanderlinden/Extender: STONE III, HOYT Weeks in Treatment: 9 Edema Assessment Assessed: [Left: No] [Right: No] Edema: [Left: Ye] [Right: s] Calf Left: Right: Point of Measurement: 40 cm From Medial Instep cm 43.5 cm Ankle Left: Right: Point of Measurement: 13 cm From Medial Instep cm 27 cm Vascular Assessment Claudication: Claudication Assessment [Right:None] Pulses: Dorsalis Pedis Palpable: [Right:Yes] Posterior Tibial Extremity colors, hair growth, and conditions: Extremity Color: [Right:Hyperpigmented] Hair Growth on Extremity: [Right:Yes] Temperature of Extremity: [Right:Cool] Capillary Refill: [Right:< 3 seconds] Toe Nail Assessment Left: Right: Thick: Yes Discolored: Yes Deformed: Yes Improper Length and Hygiene: Yes Electronic Signature(s) Signed: 06/28/2017 4:28:38 PM By: Roger Shelter Entered By: Roger Shelter on 06/28/2017 08:31:16 [name], [name] ([id number]) -------------------------------------------------------------------------------- Multi Wound Chart Details Patient Name: [name] Date of Service: 06/28/2017 8:15 AM Medical Record Number: [id number] Patient Account Number: [id number] Date of Birth/Sex: 1950/09/24 (67 y.o. 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] [N/A:N/A] Comorbid History: [6:Chronic Obstructive Pulmonary Disease (COPD), Arrhythmia, Deep Vein Thrombosis, Hypertension, Type II Diabetes, Gout, Neuropathy] [N/A:N/A] Date Acquired: [6:04/12/2017] [N/A:N/A] Weeks of Treatment: [6:9] [N/A:N/A] Wound Status: [6:Open] [N/A:N/A] Measurements L x W x D [6:0.6x0.6x0.2] [N/A:N/A] (cm) Area (cm) : [6:0.283] [N/A:N/A] Volume (cm) : [6:0.057] [N/A:N/A] % Reduction in Area: [6:42.80%] [N/A:N/A] % Reduction in Volume: [6:42.40%] [N/A:N/A] Classification: [6:Grade 2] [N/A:N/A] Exudate Amount: [6:Medium] [N/A:N/A] Exudate Type: [6:Serous] [N/A:N/A] Exudate Color: [6:amber] [N/A:N/A] Wound Margin: [6:Flat and Intact] [N/A:N/A] Granulation Amount: [6:Large (67-100%)] [N/A:N/A] Granulation Quality: [6:Red] [N/A:N/A] Necrotic Amount: [6:Small (1-33%)] [N/A:N/A] Exposed Structures: [6:Fat Layer (Subcutaneous Tissue) Exposed: Yes Fascia: No Tendon: No Muscle: No Joint: No Bone: No] [N/A:N/A] Epithelialization: [6:Small (1-33%)]  [N/A:N/A] Periwound Skin Texture: Scarring: Yes N/A N/A Excoriation: No Induration: No Callus: No Crepitus: No Rash: No Periwound Skin Moisture: Maceration: No N/A N/A Dry/Scaly: No Periwound Skin Color: Atrophie Blanche: No N/A N/A Cyanosis: No Ecchymosis: No Erythema: No Hemosiderin Staining: No Mottled: No Pallor: No Rubor: No Temperature: No Abnormality N/A N/A Tenderness on Palpation: Yes N/A N/A Wound Preparation: Ulcer Cleansing: N/A N/A Rinsed/Irrigated with Saline, Other: soap and water Topical Anesthetic Applied: Other: lidocaine 4% Treatment Notes Electronic Signature(s) Signed: 06/28/2017 4:28:38 PM By: Roger Shelter Entered By: Roger Shelter on 06/28/2017 08:31:29 [name], [name] ([id number]) -------------------------------------------------------------------------------- [facility] Details Patient Name: [name] Date of Service: 06/28/2017 8:15 AM Medical Record Number: [id number] Patient Account Number: [id number] Date of Birth/Sex: 29-Oct-1950 (67 y.o. Male) Treating RN: Roger Shelter Primary Care Katrinia Straker: Royetta Crochet Other Clinician: Referring Kalub Morillo: Royetta Crochet Treating Chaney Ingram/Extender: STONE III, HOYT Weeks in Treatment: 9 Active Inactive ` Orientation to the Wound Care Program Nursing Diagnoses: Knowledge deficit related to the wound healing center program Goals: Patient/caregiver will verbalize understanding of the [facility] Program Date Initiated: 04/26/2017 Target Resolution Date: 05/27/2017 Goal Status: Active Interventions: Provide education on orientation to the wound center Notes: ` Wound/Skin Impairment Nursing Diagnoses: Impaired tissue integrity Knowledge deficit related to ulceration/compromised skin integrity Goals: Patient/caregiver will verbalize understanding of skin care regimen Date Initiated: 04/26/2017 Target Resolution Date: 05/27/2017 Goal Status:  Active Ulcer/skin breakdown will have a volume reduction of 30% by week 4 Date Initiated: 04/26/2017 Target Resolution Date: 05/27/2017 Goal Status: Active Interventions: Assess ulceration(s) every visit Provide education on ulcer and skin care Treatment Activities: Skin care regimen initiated : 04/26/2017 Notes: Electronic Signature(s) Signed: 06/28/2017 4:28:38 PM By: [name], [name] ([id number]) Entered By: Roger Shelter on 06/28/2017 08:31:21 [name], [name] ([id number]) -------------------------------------------------------------------------------- Pain Assessment Details Patient Name: [name] Date of Service: 06/28/2017 8:15 AM Medical Record Number: [id number] Patient Account Number: [id number] Date of Birth/Sex: 1951-04-23 (67 y.o. Male) Treating RN: Roger Shelter Primary Care Fidelia Cathers: Royetta Crochet Other Clinician: Referring Alonda Weaber: Royetta Crochet Treating Daylynn Stumpp/Extender: STONE III, HOYT Weeks in Treatment: 9 Active Problems Location of Pain Severity and Description of Pain Patient Has Paino Yes Site Locations Pain Location: Generalized Pain Duration of the Pain. Constant / Intermittento Constant Rate the pain. Current Pain Level: 4 Character of Pain Describe the Pain: Aching Pain Management and Medication Current Pain Management: Goals for Pain Management Topical or injectable lidocaine is offered to patient for acute pain when surgical debridement is performed. If needed, Patient is instructed to use over the counter pain medication for the following 24-48 hours after debridement. Wound care MDs do not prescribed pain medications. Patient has chronic pain or uncontrolled pain. Patient has been instructed to make an  appointment with their Primary Care Physician for pain management. Electronic Signature(s) Signed: 06/28/2017 4:28:38 PM By: Roger Shelter Entered By: Roger Shelter on 06/28/2017  14:55:42 [name], [name] ([id number]) -------------------------------------------------------------------------------- Patient/Caregiver Education Details Patient Name: [name] Date of Service: 06/28/2017 8:15 AM Medical Record Number: [id number] Patient Account Number: [id number] Date of Birth/Gender: 1951/04/27 (67 y.o. Male) Treating RN: Roger Shelter Primary Care Physician: Royetta Crochet Other Clinician: Referring Physician: Royetta Crochet Treating Physician/Extender: Sharalyn Ink in Treatment: 9 Education Assessment Education Provided To: Patient Education Topics Provided Wound/Skin Impairment: Handouts: Caring for Your Ulcer Methods: Explain/Verbal Responses: State content correctly Electronic Signature(s) Signed: 06/28/2017 4:28:38 PM By: Roger Shelter Entered By: Roger Shelter on 06/28/2017 09:03:06 [name], [name] ([id number]) -------------------------------------------------------------------------------- Wound Assessment Details Patient Name: [name] Date of Service: 06/28/2017 8:15 AM Medical Record Number: [id number] Patient Account Number: [id number] Date of Birth/Sex: [birth year] (67 y.o. Male) Treating RN: Roger Shelter Primary Care Jenetta Wease: Royetta Crochet Other Clinician: Referring Ellena Kamen: Royetta Crochet Treating Izick Gasbarro/Extender: STONE III, HOYT Weeks in Treatment: 9 Wound Status Wound Number: 6 Primary Diabetic Wound/Ulcer of the Lower Extremity Etiology: Wound Location: Right Lower Leg - Anterior, Proximal Wound Open Wounding Event: Gradually Appeared Status: Date Acquired: 04/12/2017 Comorbid Chronic Obstructive Pulmonary Disease Weeks Of Treatment: 9 History: (COPD), Arrhythmia, Deep Vein Thrombosis, Clustered Wound: No Hypertension, Type II Diabetes, Gout, Neuropathy Photos Photo Uploaded By: Roger Shelter on 06/28/2017 16:31:50 Wound Measurements Length: (cm) 0.6 Width: (cm) 0.6 Depth:  (cm) 0.2 Area: (cm) 0.283 Volume: (cm) 0.057 % Reduction in Area: 42.8% % Reduction in Volume: 42.4% Epithelialization: Small (1-33%) Tunneling: No Undermining: No Wound Description Classification: Grade 2 Wound Margin: Flat and Intact Exudate Amount: Medium Exudate Type: Serous Exudate Color: amber Foul Odor After Cleansing: No Slough/Fibrino Yes Wound Bed Granulation Amount: Large (67-100%) Exposed Structure Granulation Quality: Red Fascia Exposed: No Necrotic Amount: Small (1-33%) Fat Layer (Subcutaneous Tissue) Exposed: Yes Necrotic Quality: Adherent Slough Tendon Exposed: No Muscle Exposed: No Joint Exposed: No Bone Exposed: No Tollett, Clide ([id number]) Periwound Skin Texture Texture Color No Abnormalities Noted: No No Abnormalities Noted: No Callus: No Atrophie Blanche: No Crepitus: No Cyanosis: No Excoriation: No Ecchymosis: No Induration: No Erythema: No Rash: No Hemosiderin Staining: No Scarring: Yes Mottled: No Pallor: No Moisture Rubor: No No Abnormalities Noted: No Dry / Scaly: No Temperature / Pain Maceration: No Temperature: No Abnormality Tenderness on Palpation: Yes Wound Preparation Ulcer Cleansing: Rinsed/Irrigated with Saline, Other: soap and water, Topical Anesthetic Applied: Other: lidocaine 4%, Treatment Notes Wound #6 (Right, Proximal, Anterior Lower Leg) 1. Cleansed with: Clean wound with Normal Saline Cleanse wound with antibacterial soap and water 2. Anesthetic Topical Lidocaine 4% cream to wound bed prior to debridement 4. Dressing Applied: Hydrogel Prisma Ag 5. Secondary Dressing Applied Non-Adherent pad 7. Secured with 3 Layer Compression System - Right Lower Extremity Notes , unna boot to anchor Electronic Signature(s) Signed: 06/28/2017 4:28:38 PM By: Roger Shelter Entered By: Roger Shelter on 06/28/2017 08:29:08 Dimario, [name]  ([id number]) -------------------------------------------------------------------------------- Vitals Details Patient Name: [name] Date of Service: 06/28/2017 8:15 AM Medical Record Number: [id number] Patient Account Number: [id number] Date of Birth/Sex: 1950/10/19 (67 y.o. Male) Treating RN: Roger Shelter Primary Care Alany Borman: Royetta Crochet Other Clinician: Referring Tyshell Ramberg: Royetta Crochet Treating Ermelinda Eckert/Extender: STONE III, HOYT Weeks in Treatment: 9 Vital Signs Time Taken: 08:15 Temperature (F): 99.3 Height (in): 74 Pulse (bpm): 74 Weight (lbs): 252 Respiratory Rate (breaths/min): 18 Body Mass Index (BMI): 32.4 Blood Pressure (mmHg): 152/82 Reference Range: 80 - 120 mg / dl  Electronic Signature(s) Signed: 06/28/2017 4:28:38 PM By: Roger Shelter Entered By: Roger Shelter on 06/28/2017 08:17:01

## 2017-06-29 NOTE — Progress Notes (Signed)
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 Obtained From Patient Wound History Do you currently have one or more  open woundso Yes How many  open wounds do you currently haveo 2 Approximately how long have you had your woundso 2 weeks How have you been treating your wound(s) until nowo vasaline Has your wound(s) ever healed and then re-openedo Yes Have you had any lab work done in the past montho Yes Who ordered the lab work doneo PCP Have you had any tests for circulation on your legso Yes Who ordered the testo [facility] Where was the test doneo [facility] Constitutional Symptoms (General Health) Complaints and Symptoms: Negative for: Fever; Chills Cardiovascular Complaints and Symptoms: Positive for: LE edema Medical History: Positive for: Arrhythmia - a fib; Deep Vein Thrombosis; Hypertension Negative for: Angina; Congestive Heart Failure; Coronary Artery Disease; Hypotension; Myocardial Infarction; Peripheral Arterial Disease; Peripheral Venous Disease; Phlebitis; Vasculitis Eyes Medical History: Negative for: Cataracts; Glaucoma; Optic Neuritis Ear/Nose/Mouth/Throat Medical History: Negative for: Chronic sinus problems/congestion; Middle ear problems Past Medical History Notes: difficulty speaking Hematologic/Lymphatic Medical History: Negative for: Anemia; Hemophilia; Human Immunodeficiency Virus; Lymphedema; Sickle Cell Disease Respiratory Complaints and Symptoms: No Complaints or Symptoms Litzinger, Marquavius ([id number]) Medical History: Positive for: Chronic Obstructive Pulmonary Disease (COPD) Negative for: Aspiration; Asthma; Pneumothorax; Sleep Apnea; Tuberculosis Past Medical History Notes: Hospitalized for Pneumonia 02/2017 Gastrointestinal Medical History: Negative for: Cirrhosis ; Colitis; Crohnos; Hepatitis A; Hepatitis B; Hepatitis C Endocrine Medical History: Positive for: Type II Diabetes Negative for: Type I Diabetes Time with diabetes: 1 month Treated with: Diet Blood sugar tested every day: No Genitourinary Medical History: Negative for: End Stage Renal Disease Immunological Medical  History: Negative for: Lupus Erythematosus; Raynaudos; Scleroderma Integumentary (Skin) Medical History: Negative for: History of Burn; History of pressure wounds Musculoskeletal Medical History: Positive for: Gout Negative for: Rheumatoid Arthritis; Osteoarthritis; Osteomyelitis Neurologic Medical History: Positive for: Neuropathy Negative for: Dementia; Quadriplegia; Paraplegia; Seizure Disorder Past Medical History Notes: CVA in 2014 Oncologic Medical History: Negative for: Received Chemotherapy; Received Radiation Psychiatric Complaints and Symptoms: No Complaints or Symptoms Baranek, Macrae ([id number]) Medical History: Negative for: Anorexia/bulimia; Confinement Anxiety Immunizations Pneumococcal Vaccine: Received Pneumococcal Vaccination: Yes Immunization Notes: up to date Implantable Devices Hospitalization / Surgery History Name of Hospital Purpose of Hospitalization/Surgery Date [place] CVA 05/17/2012 [place] Pneumonia 03/17/2017 Family and Social History Cancer: No; Diabetes: No; Heart Disease: Yes - Mother,Father; Hereditary Spherocytosis: No; Hypertension: Yes - Mother,Father; Stroke: Yes - Father; Thyroid Problems: No; Tuberculosis: No; Current every day smoker; Marital Status - Single; Alcohol Use: Never; Drug Use: No History; Caffeine Use: Daily; Financial Concerns: No; Food, Clothing or Shelter Needs: No; Support System Lacking: No; Transportation Concerns: No; Advanced Directives: No; Patient does not want information on Advanced Directives Physician Affirmation I have reviewed and agree with the above information. Electronic Signature(s) Signed: 06/28/2017 4:28:38 PM By: Roger Shelter Signed: 06/28/2017 5:22:42 PM By: Worthy Keeler PA-C Entered By: Worthy Keeler on 06/28/2017 09:06:36 Heffington, Juleen [name] ([id number]) -------------------------------------------------------------------------------- SuperBill Details Patient Name: [name] Date of Service: 06/28/2017 Medical Record Number: [id number] Patient Account Number: [id number] Date of Birth/Sex: 08/17/1950 (67 y.o. 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 I87.331 Chronic venous hypertension (idiopathic) with ulcer and inflammation of right lower extremity E11.51 Type 2 diabetes mellitus with diabetic peripheral  angiopathy without gangrene Facility Procedures CPT4 Code: 53005110 Description: [phone] - WOUND CARE VISIT-LEV 2 EST PT Modifier: Quantity: 1 Physician Procedures CPT4: Description Modifier Quantity Code [id number] - WC PHYS LEVEL 3 - EST PT 1 ICD-10 Diagnosis Description E11.622 Type 2 diabetes mellitus with other skin ulcer L97.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 L97.513  Non-pressure chronic ulcer of other part of right foot with necrosis of muscle I87.331 Chronic venous hypertension (idiopathic) with ulcer and inflammation of right lower extremity Electronic Signature(s) Signed: 06/28/2017 5:22:42 PM By: Worthy Keeler PA-C Entered By: Worthy Keeler on 06/28/2017 09:07:56

## 2017-07-05 ENCOUNTER — Encounter: Payer: Medicare Other | Admitting: Physician Assistant

## 2017-07-05 DIAGNOSIS — E11621 Type 2 diabetes mellitus with foot ulcer: Secondary | ICD-10-CM | POA: Diagnosis not present

## 2017-07-06 NOTE — Progress Notes (Signed)
ESSEX, PERRY ([id number]) Visit Report for 07/05/2017 Arrival Information Details Patient Name: [name], [name] Date of Service: 07/05/2017 12:30 PM Medical Record Number: [id number] Patient Account Number: [id number] Date of Birth/Sex: May 11, 1951 (67 y.o. Male) Treating RN: Montey Hora Primary Care Jamauri Kruzel: Royetta Crochet Other Clinician: Referring Loretta Kluender: Royetta Crochet Treating River Ambrosio/Extender: Melburn Hake, HOYT Weeks in Treatment: 10 Visit Information History Since Last Visit Added or deleted any medications: No Patient Arrived: Ambulatory Any new allergies or adverse reactions: No Arrival Time: 12:32 Had a fall or experienced change in No Accompanied By: self activities of daily living that may affect Transfer Assistance: None risk of falls: Patient Identification Verified: Yes Signs or symptoms of abuse/neglect since last visito No Secondary Verification Process Yes Hospitalized since last visit: No Completed: Has Dressing in Place as Prescribed: Yes Patient Requires Transmission-Based No Has Compression in Place as Prescribed: Yes Precautions: Pain Present Now: No Patient Has Alerts: Yes Patient Alerts: Patient on Blood Thinner Coumadin 81mg  Aspirin DM II o Electronic Signature(s) Signed: 07/05/2017 4:30:06 PM By: Montey Hora Entered By: Montey Hora on 07/05/2017 12:33:20 Arana, [name] ([id number]) -------------------------------------------------------------------------------- Encounter Discharge Information Details Patient Name: [name] Date of Service: 07/05/2017 12:30 PM Medical Record Number: [id number] Patient Account Number: [id number] Date of Birth/Sex: [birth year] (67 y.o. Male) Treating RN: Roger Shelter Primary Care Giuseppina Quinones: Royetta Crochet Other Clinician: Referring Lashundra Shiveley: Royetta Crochet Treating Emberlin Verner/Extender: STONE III, HOYT Weeks in Treatment: 10 Encounter Discharge Information Items Discharge Pain  Level: 0 Discharge Condition: Stable Ambulatory Status: Ambulatory Discharge Destination: Home Private Transportation: Auto Accompanied By: self Schedule Follow-up Appointment: Yes Medication Reconciliation completed and provided No to Patient/Care Oona Trammel: Clinical Summary of Care: Electronic Signature(s) Signed: 07/05/2017 2:14:45 PM By: Roger Shelter Entered By: Roger Shelter on 07/05/2017 13:04:56 Ankney, [name] ([id number]) -------------------------------------------------------------------------------- Lower Extremity Assessment Details Patient Name: [name] Date of Service: 07/05/2017 12:30 PM Medical Record Number: [id number] Patient Account Number: [id number] Date of Birth/Sex: Apr 17, 1951 (67 y.o. Male) Treating RN: Montey Hora Primary Care Johana Hopkinson: Royetta Crochet Other Clinician: Referring Jazell Rosenau: Royetta Crochet Treating Oluwanifemi Susman/Extender: STONE III, HOYT Weeks in Treatment: 10 Edema Assessment Assessed: [Left: No] [Right: No] E[Left: dema] [Right: :] Calf Left: Right: Point of Measurement: 40 cm From Medial Instep cm 43.5 cm Ankle Left: Right: Point of Measurement: 13 cm From Medial Instep cm 27.6 cm Vascular Assessment Pulses: Dorsalis Pedis Palpable: [Right:Yes] Posterior Tibial Extremity colors, hair growth, and conditions: Extremity Color: [Right:Hyperpigmented] Hair Growth on Extremity: [Right:No] Temperature of Extremity: [Right:Warm] Capillary Refill: [Right:< 3 seconds] Toe Nail Assessment Left: Right: Thick: Yes Discolored: Yes Deformed: Yes Improper Length and Hygiene: Yes Electronic Signature(s) Signed: 07/05/2017 4:30:06 PM By: Montey Hora Entered By: Montey Hora on 07/05/2017 12:44:39 Frankland, [name] ([id number]) -------------------------------------------------------------------------------- Multi Wound Chart Details Patient Name: [name] Date of Service: 07/05/2017 12:30 PM Medical  Record Number: [id number] Patient Account Number: [id number] Date of Birth/Sex: [birth year] (67 y.o. Male) Treating RN: Ahmed Prima Primary Care Jaymin Waln: Royetta Crochet Other Clinician: Referring Moishe Schellenberg: Royetta Crochet Treating Garvey Westcott/Extender: STONE III, HOYT Weeks in Treatment: 10 Vital Signs Height(in): 35 Pulse(bpm): 61 Weight(lbs): 252 Blood Pressure(mmHg): 153/91 Body Mass Index(BMI): 32 Temperature(F): 98.7 Respiratory Rate 18 (breaths/min): Photos: [N/A:N/A] Wound Location: Right, Proximal, Anterior N/A N/A Lower Leg Wounding Event: Gradually Appeared N/A N/A Primary Etiology: Diabetic Wound/Ulcer of the N/A N/A Lower Extremity Comorbid History: Chronic Obstructive N/A N/A Pulmonary Disease (COPD), Arrhythmia, Deep Vein Thrombosis, Hypertension, Type II Diabetes, Gout, Neuropathy Date Acquired: 04/12/2017 N/A N/A Weeks of Treatment: 10 N/A N/A Wound Status: Open N/A  N/A Measurements L x W x D 0.2x0.3x0.2 N/A N/A (cm) Area (cm) : 0.047 N/A N/A Volume (cm) : 0.009 N/A N/A % Reduction in Area: 90.50% N/A N/A % Reduction in Volume: 90.90% N/A N/A Classification: Grade 2 N/A N/A Exudate Amount: Medium N/A N/A Exudate Type: Serous N/A N/A Exudate Color: amber N/A N/A Wound Margin: Flat and Intact N/A N/A Granulation Amount: Large (67-100%) N/A N/A Granulation Quality: Red N/A N/A Necrotic Amount: Small (1-33%) N/A N/A Exposed Structures: N/A N/A Lundeen, [name] ([id number]) Fat Layer (Subcutaneous Tissue) Exposed: Yes Fascia: No Tendon: No Muscle: No Joint: No Bone: No Epithelialization: Small (1-33%) N/A N/A Periwound Skin Texture: Scarring: Yes N/A N/A Excoriation: No Induration: No Callus: No Crepitus: No Rash: No Periwound Skin Moisture: Maceration: No N/A N/A Dry/Scaly: No Periwound Skin Color: Atrophie Blanche: No N/A N/A Cyanosis: No Ecchymosis: No Erythema: No Hemosiderin Staining: No Mottled: No Pallor: No Rubor:  No Temperature: No Abnormality N/A N/A Tenderness on Palpation: Yes N/A N/A Wound Preparation: Ulcer Cleansing: N/A N/A Rinsed/Irrigated with Saline, Other: soap and water Topical Anesthetic Applied: Other: lidocaine 4% Treatment Notes Electronic Signature(s) Signed: 07/05/2017 4:06:48 PM By: Alric Quan Entered By: Alric Quan on 07/05/2017 12:51:31 Gahm, [name] ([id number]) -------------------------------------------------------------------------------- [place] Details Patient Name: [name] Date of Service: 07/05/2017 12:30 PM Medical Record Number: [id number] Patient Account Number: [id number] Date of Birth/Sex: [birth year] (67 y.o. Male) Treating RN: Ahmed Prima Primary Care Lestine Rahe: Royetta Crochet Other Clinician: Referring Caleesi Kohl: Royetta Crochet Treating Oleg Oleson/Extender: STONE III, HOYT Weeks in Treatment: 10 Active Inactive ` Orientation to the Wound Care Program Nursing Diagnoses: Knowledge deficit related to the wound healing center program Goals: Patient/caregiver will verbalize understanding of the [place] Program Date Initiated: 04/26/2017 Target Resolution Date: 05/27/2017 Goal Status: Active Interventions: Provide education on orientation to the wound center Notes: ` Wound/Skin Impairment Nursing Diagnoses: Impaired tissue integrity Knowledge deficit related to ulceration/compromised skin integrity Goals: Patient/caregiver will verbalize understanding of skin care regimen Date Initiated: 04/26/2017 Target Resolution Date: 05/27/2017 Goal Status: Active Ulcer/skin breakdown will have a volume reduction of 30% by week 4 Date Initiated: 04/26/2017 Target Resolution Date: 05/27/2017 Goal Status: Active Interventions: Assess ulceration(s) every visit Provide education on ulcer and skin care Treatment Activities: Skin care regimen initiated : 04/26/2017 Notes: Electronic  Signature(s) Signed: 07/05/2017 4:06:48 PM By: Alric Quan Schwegler, [name] ([id number]) Entered By: Alric Quan on 07/05/2017 12:51:23 Bloodsaw, [name] ([id number]) -------------------------------------------------------------------------------- Pain Assessment Details Patient Name: [name] Date of Service: 07/05/2017 12:30 PM Medical Record Number: [id number] Patient Account Number: [id number] Date of Birth/Sex: 07-Mar-1951 (67 y.o. Male) Treating RN: Montey Hora Primary Care Pedrohenrique Mcconville: Royetta Crochet Other Clinician: Referring Atiya Yera: Royetta Crochet Treating Tylan Briguglio/Extender: STONE III, HOYT Weeks in Treatment: 10 Active Problems Location of Pain Severity and Description of Pain Patient Has Paino Yes Site Locations Pain Location: Pain in Ulcers With Dressing Change: Yes Duration of the Pain. Constant / Intermittento Constant Pain Management and Medication Current Pain Management: Electronic Signature(s) Signed: 07/05/2017 4:30:06 PM By: Montey Hora Entered By: Montey Hora on 07/05/2017 12:33:33 Forrester, [name] ([id number]) -------------------------------------------------------------------------------- Patient/Caregiver Education Details Patient Name: [name] Date of Service: 07/05/2017 12:30 PM Medical Record Number: [id number] Patient Account Number: [id number] Date of Birth/Gender: 1951/02/19 (67 y.o. Male) Treating RN: Roger Shelter Primary Care Physician: Royetta Crochet Other Clinician: Referring Physician: Royetta Crochet Treating Physician/Extender: Sharalyn Ink in Treatment: 10 Education Assessment Education Provided To: Patient Education Topics Provided Wound/Skin Impairment: Handouts: Caring for Your Ulcer Methods: Explain/Verbal Responses: State  content correctly Electronic Signature(s) Signed: 07/05/2017 2:14:45 PM By: Roger Shelter Entered By: Roger Shelter on 07/05/2017  13:05:07 Goh, [name] ([id number]) -------------------------------------------------------------------------------- Wound Assessment Details Patient Name: [name] Date of Service: 07/05/2017 12:30 PM Medical Record Number: [id number] Patient Account Number: [id number] Date of Birth/Sex: 23-Dec-1950 (67 y.o. Male) Treating RN: Ahmed Prima Primary Care Emrik Erhard: Royetta Crochet Other Clinician: Referring Elias Bordner: Royetta Crochet Treating Lamoine Fredricksen/Extender: STONE III, HOYT Weeks in Treatment: 10 Wound Status Wound Number: 6 Primary Diabetic Wound/Ulcer of the Lower Extremity Etiology: Wound Location: Right, Proximal, Anterior Lower Leg Wound Open Wounding Event: Gradually Appeared Status: Date Acquired: 04/12/2017 Comorbid Chronic Obstructive Pulmonary Disease Weeks Of Treatment: 10 History: (COPD), Arrhythmia, Deep Vein Thrombosis, Clustered Wound: No Hypertension, Type II Diabetes, Gout, Neuropathy Photos Photo Uploaded By: Montey Hora on 07/05/2017 12:47:37 Wound Measurements Length: (cm) 0.2 Width: (cm) 0.3 Depth: (cm) 0.2 Area: (cm) 0.047 Volume: (cm) 0.009 % Reduction in Area: 90.5% % Reduction in Volume: 90.9% Epithelialization: Small (1-33%) Tunneling: No Undermining: No Wound Description Classification: Grade 2 Wound Margin: Flat and Intact Exudate Amount: Medium Exudate Type: Serous Exudate Color: amber Foul Odor After Cleansing: No Slough/Fibrino Yes Wound Bed Granulation Amount: Large (67-100%) Exposed Structure Granulation Quality: Red Fascia Exposed: No Necrotic Amount: Small (1-33%) Fat Layer (Subcutaneous Tissue) Exposed: Yes Necrotic Quality: Adherent Slough Tendon Exposed: No Muscle Exposed: No Joint Exposed: No Bone Exposed: No Filkins, Dupree ([id number]) Periwound Skin Texture Texture Color No Abnormalities Noted: No No Abnormalities Noted: No Callus: No Atrophie Blanche: No Crepitus: No Cyanosis:  No Excoriation: No Ecchymosis: No Induration: No Erythema: No Rash: No Hemosiderin Staining: No Scarring: Yes Mottled: No Pallor: No Moisture Rubor: No No Abnormalities Noted: No Dry / Scaly: No Temperature / Pain Maceration: No Temperature: No Abnormality Tenderness on Palpation: Yes Wound Preparation Ulcer Cleansing: Rinsed/Irrigated with Saline, Other: soap and water, Topical Anesthetic Applied: Other: lidocaine 4%, Treatment Notes Wound #6 (Right, Proximal, Anterior Lower Leg) 1. Cleansed with: Clean wound with Normal Saline 2. Anesthetic Topical Lidocaine 4% cream to wound bed prior to debridement 3. Peri-wound Care: Moisturizing lotion 4. Dressing Applied: Hydrogel Prisma Ag 5. Secondary Dressing Applied Non-Adherent pad 7. Secured with 3 Layer Compression System - Right Lower Extremity Notes , unna boot to anchor Electronic Signature(s) Signed: 07/05/2017 4:06:48 PM By: Alric Quan Entered By: Alric Quan on 07/05/2017 12:51:17 Schweiss, [name] ([id number]) -------------------------------------------------------------------------------- Vitals Details Patient Name: [name] Date of Service: 07/05/2017 12:30 PM Medical Record Number: [id number] Patient Account Number: [id number] Date of Birth/Sex: 20-Jan-1951 (67 y.o. Male) Treating RN: Montey Hora Primary Care Khameron Gruenwald: Royetta Crochet Other Clinician: Referring Keirsten Matuska: Royetta Crochet Treating Marvel Mcphillips/Extender: STONE III, HOYT Weeks in Treatment: 10 Vital Signs Time Taken: 12:35 Temperature (F): 98.7 Height (in): 74 Pulse (bpm): 79 Weight (lbs): 252 Respiratory Rate (breaths/min): 18 Body Mass Index (BMI): 32.4 Blood Pressure (mmHg): 153/91 Reference Range: 80 - 120 mg / dl Electronic Signature(s) Signed: 07/05/2017 4:30:06 PM By: Montey Hora Entered By: Montey Hora on 07/05/2017 12:35:59

## 2017-07-06 NOTE — Progress Notes (Signed)
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  Induration, Rash, Dry/Scaly, Maceration, Atrophie Blanche, Cyanosis, Ecchymosis, Hemosiderin Staining, Mottled, Pallor, Rubor, Erythema. Periwound temperature was noted as No Abnormality. The periwound has tenderness on palpation. 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 Cleanse wound with mild soap and water Anesthetic (add to Medication List): Wound #6 Right,Proximal,Anterior Lower Leg: Topical Lidocaine 4% cream applied to wound bed prior to debridement (In Clinic Only). Skin Barriers/Peri-Wound Care: Moisturizing lotion Primary Wound Dressing: Wound #6 Right,Proximal,Anterior Lower Leg: Prisma Ag - with hydrogel Secondary Dressing: Wound #6 Right,Proximal,Anterior Lower Leg: Non-adherent pad Dressing Change Frequency: Wound #6 Right,Proximal,Anterior Lower Leg: Change dressing every week Follow-up Appointments: Return Appointment in 1 week. Edema Control: 3 Layer Compression System - Right Lower Extremity - unna to anchor Additional Orders / Instructions: Wound #6 Right,Proximal,Anterior Lower Leg: Increase protein intake. - Vitamin A, C and Zinc The following medication(s) was prescribed: lidocaine topical 4 % cream 1 1 cream topical was prescribed at facility [facility] ([id number]) I am going to recommend that we continue with the Current wound care measures for the next week since patient seems to be doing very well with this. He's in agreement with plan. We will see  were things stand following. Please see above for specific wound care orders. We will see patient for re-evaluation in 1 week(s) here in the clinic. If anything worsens or changes patient will contact our office for additional recommendations. Electronic Signature(s) Signed: 07/05/2017 5:27:42 PM By: Worthy Keeler PA-C Entered By: Worthy Keeler on 07/05/2017 13:03:16 Pion, Juleen [name] ([id number]) -------------------------------------------------------------------------------- ROS/PFSH Details Patient Name: [name] Date of Service: 07/05/2017 12:30 PM Medical Record Number: [id number] Patient Account Number: [id number] Date of Birth/Sex: 09-10-1950 (67 y.o. Male) Treating RN: Ahmed Prima Primary Care Provider: Royetta Crochet Other Clinician: Referring Provider: Royetta Crochet Treating Provider/Extender: STONE III, HOYT Weeks in Treatment: 10 Information Obtained From Patient Wound History Do you currently have one or more open woundso Yes How many open wounds do you currently haveo 2 Approximately how long have you had your woundso 2 weeks How have you been treating your wound(s) until nowo vasaline Has your wound(s) ever healed and then re-openedo Yes Have you had any lab work done in the past montho Yes Who ordered the lab work doneo PCP Have you had any tests for circulation on your legso Yes Who ordered the test[facility] Where was the test doneo [facility] Constitutional Symptoms (General Health) Complaints and Symptoms: Negative for: Fever; Chills Cardiovascular Complaints and Symptoms: Positive for: LE edema Medical History: Positive for: Arrhythmia - a fib; Deep Vein Thrombosis; Hypertension Negative for: Angina; Congestive Heart Failure; Coronary Artery Disease; Hypotension; Myocardial Infarction; Peripheral Arterial Disease; Peripheral Venous Disease; Phlebitis; Vasculitis Eyes Medical History: Negative for: Cataracts; Glaucoma; Optic  Neuritis Ear/Nose/Mouth/Throat Medical History: Negative for: Chronic sinus problems/congestion; Middle ear problems Past Medical History Notes: difficulty speaking  Hematologic/Lymphatic Medical History: Negative for: Anemia; Hemophilia; Human Immunodeficiency Virus; Lymphedema; Sickle Cell Disease Respiratory Complaints and Symptoms: No Complaints or Symptoms Muraoka, Oluwanifemi ([id number]) Medical History: Positive for: Chronic Obstructive Pulmonary Disease (COPD) Negative for: Aspiration; Asthma; Pneumothorax; Sleep Apnea; Tuberculosis Past Medical History Notes: Hospitalized for Pneumonia 02/2017 Gastrointestinal Medical History: Negative for: Cirrhosis ; Colitis; Crohnos; Hepatitis A; Hepatitis B; Hepatitis C Endocrine Medical History: Positive for: Type II Diabetes Negative for: Type I Diabetes Time with diabetes: 1 month Treated with: Diet Blood sugar tested every day: No Genitourinary Medical History: Negative for: End Stage Renal Disease Immunological Medical History: Negative for: Lupus Erythematosus; Raynaudos; Scleroderma Integumentary (Skin) Medical History: Negative for: History of Burn; History of pressure wounds Musculoskeletal Medical History: Positive for: Gout Negative for: Rheumatoid Arthritis; Osteoarthritis; Osteomyelitis Neurologic Medical History: Positive for: Neuropathy Negative for: Dementia; Quadriplegia; Paraplegia; Seizure Disorder Past Medical History Notes: CVA in 2014 Oncologic Medical History: Negative for: Received Chemotherapy; Received Radiation Psychiatric Complaints and Symptoms: No Complaints or Symptoms Albea, Johnney ([id number]) Medical History: Negative for: Anorexia/bulimia; Confinement Anxiety Immunizations Pneumococcal Vaccine: Received Pneumococcal Vaccination: Yes Immunization Notes: up to date Implantable Devices Hospitalization / Surgery History Name of Hospital Purpose of Hospitalization/Surgery  Date [facility] CVA 05/17/2012 [facility] Pneumonia 03/17/2017 Family and Social History Cancer: No; Diabetes: No; Heart Disease: Yes - Mother,Father; Hereditary Spherocytosis: No; Hypertension: Yes - Mother,Father; Stroke: Yes - Father; Thyroid Problems: No; Tuberculosis: No; Current every day smoker; Marital Status - Single; Alcohol Use: Never; Drug Use: No History; Caffeine Use: Daily; Financial Concerns: No; Food, Clothing or Shelter Needs: No; Support System Lacking: No; Transportation Concerns: No; Advanced Directives: No; Patient does not want information on Advanced Directives Physician Affirmation I have reviewed and agree with the above information. Electronic Signature(s) Signed: 07/05/2017 4:06:48 PM By: Alric Quan Signed: 07/05/2017 5:27:42 PM By: Worthy Keeler PA-C Entered By: Worthy Keeler on 07/05/2017 13:02:19 Gibbard, Juleen [name] ([id number]) -------------------------------------------------------------------------------- SuperBill Details Patient Name: [name] Date of Service: 07/05/2017 Medical Record Number: [id number] Patient Account Number: [id number] Date of Birth/Sex: [birth year] (67 y.o. 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 I87.331 Chronic venous hypertension (idiopathic) with ulcer and inflammation of right lower extremity E11.51 Type 2 diabetes mellitus with diabetic peripheral angiopathy without gangrene Facility Procedures CPT4 Code Description: [id number] (Facility Use Only) [phone] - [facility] LEG Modifier: Quantity:  1 Physician Procedures CPT4: Description Modifier Quantity Code [id number] - WC PHYS LEVEL 3 - EST PT 1 ICD-10 Diagnosis Description E11.622 Type 2 diabetes mellitus with other skin ulcer L97.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 L97.513  Non-pressure chronic ulcer of other part of right foot with necrosis of muscle I87.331 Chronic venous hypertension (idiopathic) with ulcer and inflammation of right lower extremity Electronic Signature(s) Signed: 07/05/2017 3:23:31 PM By: Alric Quan Signed: 07/05/2017 5:27:42 PM By: Worthy Keeler PA-C Entered By: Alric Quan on 07/05/2017 15:23:31

## 2017-07-12 ENCOUNTER — Encounter: Payer: Medicare Other | Admitting: Physician Assistant

## 2017-07-12 DIAGNOSIS — E11621 Type 2 diabetes mellitus with foot ulcer: Secondary | ICD-10-CM | POA: Diagnosis not present

## 2017-07-14 NOTE — Progress Notes (Signed)
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 Surgical History Notes Ear/Nose/Mouth/Throat difficulty speaking Respiratory Hospitalized for Pneumonia 02/2017 Neurologic CVA in 2014 Review of Systems (ROS) Constitutional Symptoms (General Health) Denies complaints or symptoms of Fever, Chills. Respiratory The patient has no complaints or symptoms. Cardiovascular Complains or has symptoms of LE edema. Psychiatric The patient has no complaints or symptoms. Objective Constitutional Well-nourished and well-hydrated in no acute distress. Vitals Time Taken: 10:17 AM, Height: 74 in, Weight: 252 lbs, BMI: 32.4, Temperature: 98.6 F, Pulse: 47 bpm, Respiratory Rate: 18 breaths/min, Blood Pressure: 156/82 mmHg. Respiratory normal breathing without difficulty. clear to auscultation bilaterally. Cardiovascular regular rate and rhythm with normal S1, S2. 1+ pitting edema of the bilateral lower extremities. Psychiatric this patient is able to make decisions and demonstrates good insight into disease process. Alert and Oriented x 3. pleasant and cooperative. General Notes: Patient's wound bed just shows a very small opening centrally with the majority of this Artie been epithelial eyes and filling in very nicely. I'm extremely pleased with the progress that is made over the past week especially. Integumentary (Hair, Skin) Wound #6 status is Open. Original cause of wound was Gradually Appeared. The wound is located on the Right,Proximal,Anterior Lower Leg. The wound measures 0.1cm length x 0.1cm width x 0.1cm depth; 0.008cm^2 area and 0.001cm^3 volume. There is Fat Layer (Subcutaneous Tissue) Exposed exposed. There is no tunneling or undermining noted. There is a none present amount of drainage noted. The wound margin is flat and intact. There is no granulation within Ghattas, Laymon ([id number]) the wound bed. There is a large (67-100%) amount of necrotic tissue within the wound bed including Eschar. The  periwound skin appearance exhibited: Scarring. The periwound skin appearance did not exhibit: Callus, Crepitus, Excoriation, Induration, Rash, Dry/Scaly, Maceration, Atrophie Blanche, Cyanosis, Ecchymosis, Hemosiderin Staining, Mottled, Pallor, Rubor, Erythema. Periwound temperature was noted as No Abnormality. The periwound has tenderness on palpation. 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 There was a Skin/Subcutaneous Tissue Debridement ([id number]) debridement with total area of 0.01 sq cm performed by STONE III, HOYT E., PA-C. with the following instrument(s): Curette to remove Viable and Non-Viable tissue/material including Exudate, Fibrin/Slough, and Subcutaneous after achieving pain control using Lidocaine 4% Topical Solution. A time out was conducted at 10:35, prior to the start of the procedure. A Minimum amount of bleeding was controlled with Pressure. The procedure was tolerated well with a pain level of 0 throughout and a pain level of 0 following the procedure. Post Debridement Measurements: 0.2cm length x 0.2cm width x 0.2cm depth; 0.006cm^3 volume. Character of Wound/Ulcer Post Debridement requires further debridement. Severity of Tissue Post Debridement is: Fat layer exposed. Post procedure Diagnosis Wound #6: Same as Pre-Procedure Plan Wound Cleansing: Wound #6  Right,Proximal,Anterior Lower Leg: Clean wound with Normal Saline. Cleanse wound with mild soap and water Anesthetic (add to Medication List): Wound #6 Right,Proximal,Anterior Lower Leg: Topical Lidocaine 4% cream applied to wound bed prior to debridement (In Clinic  Only). Skin Barriers/Peri-Wound Care: Moisturizing lotion Primary Wound Dressing: Wound #6 Right,Proximal,Anterior Lower Leg: Hydrogel Sagun, Odin ([id number]) Prisma Ag - with hydrogel Secondary Dressing: Wound #6 Right,Proximal,Anterior Lower Leg: Non-adherent pad Dressing Change Frequency: Wound #6 Right,Proximal,Anterior Lower Leg: Change dressing every week Follow-up Appointments: Return Appointment in 1 week. Edema Control: 3 Layer Compression System - Right Lower Extremity - unna to anchor Additional Orders / Instructions: Wound #6 Right,Proximal,Anterior Lower Leg: Increase protein intake. - Vitamin A, C and Zinc The following medication(s) was prescribed: lidocaine topical 4 % cream 1 1 cream topical was prescribed at facility Currently I'm going to recommend that we continue with the Current wound care measures for the next week since he seems to be doing so well and patient is in agreement with the plan. We will see him for follow-up at that point. Please see above for specific wound care orders. We will see patient for re-evaluation in 1 week(s) here in the clinic. If anything worsens or changes patient will contact our office for additional recommendations. Electronic Signature(s) Signed: 07/12/2017 5:35:20 PM By: Worthy Keeler PA-C Entered By: Worthy Keeler on 07/12/2017 17:28:57 Fee, [name] ([id number]) -------------------------------------------------------------------------------- ROS/PFSH Details Patient Name: [name] Date of Service: 07/12/2017 10:15 AM Medical Record Number: [id number] Patient Account Number: [id number] Date of Birth/Sex: [birth year] (67 y.o.  Male) Treating RN: Ahmed Prima Primary Care Provider: Royetta Crochet Other Clinician: Referring Provider: Royetta Crochet Treating Provider/Extender: STONE III, HOYT Weeks in Treatment: 11 Information Obtained From Patient Wound History Do you currently have one or more open woundso Yes How many open wounds do you currently haveo 2 Approximately how long have you had your woundso 2 weeks How have you been treating your wound(s) until nowo vasaline Has your wound(s) ever healed and then re-openedo Yes Have you had any lab work done in the past montho Yes Who ordered the lab work doneo PCP Have you had any tests for circulation on your legso Yes Who ordered the testo [facility] Where was the test doneo [facility] Constitutional Symptoms (General Health) Complaints and Symptoms: Negative for: Fever; Chills Cardiovascular Complaints and Symptoms: Positive for: LE edema Medical History: Positive for: Arrhythmia - a fib; Deep Vein Thrombosis; Hypertension Negative for: Angina; Congestive Heart Failure; Coronary Artery Disease; Hypotension; Myocardial Infarction; Peripheral Arterial Disease; Peripheral Venous Disease; Phlebitis; Vasculitis Eyes Medical History: Negative for: Cataracts; Glaucoma; Optic Neuritis Ear/Nose/Mouth/Throat Medical History: Negative for: Chronic sinus problems/congestion; Middle ear problems Past Medical History Notes: difficulty speaking Hematologic/Lymphatic Medical History: Negative for: Anemia; Hemophilia; Human Immunodeficiency Virus; Lymphedema; Sickle Cell Disease Respiratory Complaints and Symptoms: No Complaints or Symptoms Kliethermes, Deke ([id number]) Medical History: Positive for: Chronic Obstructive Pulmonary Disease (COPD) Negative for: Aspiration; Asthma; Pneumothorax; Sleep Apnea; Tuberculosis Past Medical History Notes: Hospitalized for Pneumonia 02/2017 Gastrointestinal Medical History: Negative for: Cirrhosis ; Colitis; Crohnos;  Hepatitis A; Hepatitis B; Hepatitis C Endocrine Medical History: Positive for: Type II Diabetes Negative for: Type I Diabetes Time with diabetes: 1 month Treated with: Diet Blood sugar tested every day: No Genitourinary Medical History: Negative for: End Stage Renal Disease Immunological Medical History: Negative for: Lupus Erythematosus; Raynaudos; Scleroderma Integumentary (Skin) Medical History: Negative for: History of Burn; History of pressure wounds Musculoskeletal Medical History: Positive for: Gout Negative for: Rheumatoid Arthritis; Osteoarthritis; Osteomyelitis Neurologic Medical History: Positive for: Neuropathy Negative for: Dementia; Quadriplegia; Paraplegia; Seizure Disorder Past Medical History Notes: CVA in 2014 Oncologic Medical History: Negative for: Received Chemotherapy; Received Radiation Psychiatric Complaints and Symptoms: No Complaints or Symptoms Geidel, Trinton (  [id number]) Medical History: Negative for: Anorexia/bulimia; Confinement Anxiety Immunizations Pneumococcal Vaccine: Received Pneumococcal Vaccination: Yes Immunization Notes: up to date Implantable Devices Hospitalization / Surgery History Name of Hospital Purpose of Hospitalization/Surgery Date [facility] CVA 05/17/2012 [place] Pneumonia 03/17/2017 Family and Social History Cancer: No; Diabetes: No; Heart Disease: Yes - Mother,Father; Hereditary Spherocytosis: No; Hypertension: Yes - Mother,Father; Stroke: Yes - Father; Thyroid Problems: No; Tuberculosis: No; Current every day smoker; Marital Status - Single; Alcohol Use: Never; Drug Use: No History; Caffeine Use: Daily; Financial Concerns: No; Food, Clothing or Shelter Needs: No; Support System Lacking: No; Transportation Concerns: No; Advanced Directives: No; Patient does not want information on Advanced Directives Physician Affirmation I have reviewed and agree with the above information. Electronic Signature(s) Signed: 07/12/2017  5:35:20 PM By: Worthy Keeler PA-C Signed: 07/13/2017 4:30:35 PM By: Alric Quan Entered By: Worthy Keeler on 07/12/2017 17:27:09 Karman, [name] ([id number]) -------------------------------------------------------------------------------- SuperBill Details Patient Name: [name] Date of Service: 07/12/2017 Medical Record Number: [id number] Patient Account Number: [id number] Date of Birth/Sex: 25-Apr-1951 (67 y.o. 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 I87.331 Chronic venous hypertension (idiopathic) with ulcer and inflammation of right lower extremity E11.51 Type 2 diabetes mellitus with diabetic peripheral angiopathy without gangrene Facility Procedures CPT4 Code: 71696789 Description: 38101 - DEB SUBQ TISSUE 20 SQ CM/< ICD-10 Diagnosis Description L97.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

## 2017-07-17 NOTE — Progress Notes (Signed)
TINY, RIETZ ([id number]) Visit Report for 07/12/2017 Arrival Information Details Patient Name: [name], [name] Date of Service: 07/12/2017 10:15 AM Medical Record Number: [id number] Patient Account Number: [id number] Date of Birth/Sex: May 16, 1951 (67 y.o. Male) Treating RN: Montey Hora Primary Care Jaeden Messer: Royetta Crochet Other Clinician: Referring Danyl Deems: Royetta Crochet Treating Anav Lammert/Extender: Melburn Hake, HOYT Weeks in Treatment: 11 Visit Information History Since Last Visit Added or deleted any medications: No Patient Arrived: Ambulatory Any new allergies or adverse reactions: No Arrival Time: 10:15 Had a fall or experienced change in No Accompanied By: self activities of daily living that may affect Transfer Assistance: None risk of falls: Patient Identification Verified: Yes Signs or symptoms of abuse/neglect since last visito No Secondary Verification Process Yes Hospitalized since last visit: No Completed: Has Dressing in Place as Prescribed: Yes Patient Requires Transmission-Based No Has Compression in Place as Prescribed: Yes Precautions: Pain Present Now: No Patient Has Alerts: Yes Patient Alerts: Patient on Blood Thinner Coumadin 81mg  Aspirin DM II o Electronic Signature(s) Signed: 07/12/2017 4:46:41 PM By: Montey Hora Entered By: Montey Hora on 07/12/2017 10:17:25 Baez, [name] ([id number]) -------------------------------------------------------------------------------- Encounter Discharge Information Details Patient Name: [name] Date of Service: 07/12/2017 10:15 AM Medical Record Number: [id number] Patient Account Number: [id number] Date of Birth/Sex: August 20, 1950 (67 y.o. Male) Treating RN: Roger Shelter Primary Care Illana Nolting: Royetta Crochet Other Clinician: Referring Kaela Beitz: Royetta Crochet Treating Cleston Lautner/Extender: STONE III, HOYT Weeks in Treatment: 11 Encounter Discharge Information Items Discharge Pain  Level: 0 Discharge Condition: Stable Ambulatory Status: Ambulatory Discharge Destination: Home Private Transportation: Auto Accompanied By: self Schedule Follow-up Appointment: Yes Medication Reconciliation completed and provided No to Patient/Care Leeona Mccardle: Clinical Summary of Care: Electronic Signature(s) Signed: 07/15/2017 5:56:09 PM By: Roger Shelter Entered By: Roger Shelter on 07/12/2017 10:51:28 Frankowski, [name] ([id number]) -------------------------------------------------------------------------------- Lower Extremity Assessment Details Patient Name: [name] Date of Service: 07/12/2017 10:15 AM Medical Record Number: [id number] Patient Account Number: [id number] Date of Birth/Sex: 1950-06-02 (67 y.o. Male) Treating RN: Montey Hora Primary Care Arshia Rondon: Royetta Crochet Other Clinician: Referring Daimen Shovlin: Royetta Crochet Treating Roselani Grajeda/Extender: STONE III, HOYT Weeks in Treatment: 11 Edema Assessment Assessed: [Left: No] [Right: No] E[Left: dema] [Right: :] Calf Left: Right: Point of Measurement: 40 cm From Medial Instep cm 43.2 cm Ankle Left: Right: Point of Measurement: 13 cm From Medial Instep cm 27.5 cm Vascular Assessment Pulses: Dorsalis Pedis Palpable: [Right:Yes] Posterior Tibial Extremity colors, hair growth, and conditions: Extremity Color: [Right:Hyperpigmented] Hair Growth on Extremity: [Right:Yes] Temperature of Extremity: [Right:Warm] Capillary Refill: [Right:< 3 seconds] Toe Nail Assessment Left: Right: Thick: Yes Discolored: Yes Deformed: Yes Improper Length and Hygiene: Yes Electronic Signature(s) Signed: 07/12/2017 4:46:41 PM By: Montey Hora Entered By: Montey Hora on 07/12/2017 10:22:18 Almario, [name] ([id number]) -------------------------------------------------------------------------------- Multi Wound Chart Details Patient Name: [name] Date of Service: 07/12/2017 10:15 AM Medical  Record Number: [id number] Patient Account Number: [id number] Date of Birth/Sex: 02-18-1951 (67 y.o. 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] [N/A:N/A] Comorbid History: [6:Chronic Obstructive Pulmonary Disease (COPD), Arrhythmia, Deep Vein Thrombosis, Hypertension, Type II Diabetes, Gout, Neuropathy] [N/A:N/A] Date Acquired: [6:04/12/2017] [N/A:N/A] Weeks of Treatment: [6:11] [N/A:N/A] Wound Status: [6:Open] [N/A:N/A] Measurements L x  W x D [6:0.1x0.1x0.1] [N/A:N/A] (cm) Area (cm) : [6:0.008] [N/A:N/A] Volume (cm) : [6:0.001] [N/A:N/A] % Reduction in Area: [6:98.40%] [N/A:N/A] % Reduction in Volume: [6:99.00%] [N/A:N/A] Classification: [6:Grade 2] [N/A:N/A] Exudate Amount: [6:None Present] [N/A:N/A] Wound Margin: [6:Flat and Intact] [N/A:N/A] Granulation Amount: [6:None Present (0%)] [N/A:N/A] Necrotic Amount: [6:Large (67-100%)] [N/A:N/A] Necrotic Tissue: [6:Eschar] [N/A:N/A] Exposed Structures: [6:Fat Layer (Subcutaneous Tissue) Exposed: Yes Fascia: No Tendon: No Muscle: No Joint: No Bone: No] [N/A:N/A] Epithelialization: [6:Small (1-33%)] [N/A:N/A] Periwound Skin Texture: [6:Scarring: Yes Excoriation: No] [N/A:N/A] Induration: No Callus: No Crepitus: No Rash: No Periwound Skin Moisture: Maceration: No N/A N/A Dry/Scaly: No Periwound Skin Color: Atrophie Blanche: No N/A N/A Cyanosis: No Ecchymosis: No Erythema: No Hemosiderin  Staining: No Mottled: No Pallor: No Rubor: No Temperature: No Abnormality N/A N/A Tenderness on Palpation: Yes N/A N/A Wound Preparation: Ulcer Cleansing: N/A N/A Rinsed/Irrigated with Saline, Other: soap and water Topical Anesthetic Applied: Other: lidocaine 4% Treatment Notes Electronic Signature(s) Signed: 07/13/2017 4:30:35 PM By: Alric Quan Entered By: Alric Quan on 07/12/2017 10:36:41 Agan, [name] ([id number]) -------------------------------------------------------------------------------- [place] Details Patient Name: [name] Date of Service: 07/12/2017 10:15 AM Medical Record Number: [id number] Patient Account Number: [id number] Date of Birth/Sex: 04/07/1951 (67 y.o. Male) Treating RN: Carolyne Fiscal, Debi Primary Care Kyleeann Cremeans: Royetta Crochet Other Clinician: Referring Lorina Duffner: Royetta Crochet Treating Raciel Caffrey/Extender: STONE III, HOYT Weeks in Treatment: 11 Active Inactive ` Orientation to the Wound Care Program Nursing Diagnoses: Knowledge deficit related to the wound healing center program Goals: Patient/caregiver will verbalize understanding of the [place] Program Date Initiated: 04/26/2017 Target Resolution Date: 05/27/2017 Goal Status: Active Interventions: Provide education on orientation to the wound center Notes: ` Wound/Skin Impairment Nursing Diagnoses: Impaired tissue integrity Knowledge deficit related to ulceration/compromised skin integrity Goals: Patient/caregiver will verbalize understanding of skin care regimen Date Initiated: 04/26/2017 Target Resolution Date: 05/27/2017 Goal Status: Active Ulcer/skin breakdown will have a volume reduction of 30% by week 4 Date Initiated: 04/26/2017 Target Resolution Date: 05/27/2017 Goal Status: Active Interventions: Assess ulceration(s) every visit Provide education on ulcer and skin care Treatment Activities: Skin care regimen initiated :  04/26/2017 Notes: Electronic Signature(s) Signed: 07/13/2017 4:30:35 PM By: Alric Quan Fernandez, [name] ([id number]) Entered By: Alric Quan on 07/12/2017 10:36:04 Davenport, [name] ([id number]) -------------------------------------------------------------------------------- Pain Assessment Details Patient Name: [name] Date of Service: 07/12/2017 10:15 AM Medical Record Number: [id number] Patient Account Number: [id number] Date of Birth/Sex: 05/07/1951 (67 y.o. Male) Treating RN: Montey Hora Primary Care Natali Lavallee: Royetta Crochet Other Clinician: Referring Jill Stopka: Royetta Crochet Treating Vonna Brabson/Extender: STONE III, HOYT Weeks in Treatment: 11 Active Problems Location of Pain Severity and Description of Pain Patient Has Paino Yes Site Locations Pain Location: Pain in Ulcers With Dressing Change: Yes Duration of the Pain. Constant / Intermittento Constant Pain Management and Medication Current Pain Management: Notes Topical or injectable lidocaine is offered to patient for acute pain when surgical debridement is performed. If needed, Patient is instructed to use over the counter pain medication for the following 24-48 hours after debridement. Wound care MDs do not prescribed pain medications. Patient has chronic pain or uncontrolled pain. Patient has been instructed to make an appointment with their Primary Care Physician for pain management. Topical or injectable lidocaine is offered to patient for acute pain when surgical debridement is performed. If needed, Patient is instructed to use over the counter pain medication for the following 24-48 hours after debridement. Wound care MDs do not prescribed pain medications. Patient has chronic pain or uncontrolled pain. Patient has been instructed  to make an appointment with their Primary Care Physician for pain management. Electronic Signature(s) Signed: 07/12/2017 4:46:41 PM By: Montey Hora Entered  By: Montey Hora on 07/12/2017 10:17:38 Luster, [name] ([id number]) -------------------------------------------------------------------------------- Patient/Caregiver Education Details Patient Name: [name] Date of Service: 07/12/2017 10:15 AM Medical Record Number: [id number] Patient Account Number: [id number] Date of Birth/Gender: 09-Oct-1950 (67 y.o. Male) Treating RN: Roger Shelter Primary Care Physician: Royetta Crochet Other Clinician: Referring Physician: Royetta Crochet Treating Physician/Extender: Sharalyn Ink in Treatment: 11 Education Assessment Education Provided To: Patient Education Topics Provided Wound Debridement: Handouts: Wound Debridement Methods: Explain/Verbal Responses: State content correctly Wound/Skin Impairment: Handouts: Caring for Your Ulcer Methods: Explain/Verbal Responses: State content correctly Electronic Signature(s) Signed: 07/15/2017 5:56:09 PM By: Roger Shelter Entered By: Roger Shelter on 07/12/2017 10:51:46 Hitchner, [name] ([id number]) -------------------------------------------------------------------------------- Wound Assessment Details Patient Name: [name] Date of Service: 07/12/2017 10:15 AM Medical Record Number: [id number] Patient Account Number: [id number] Date of Birth/Sex: Aug 16, 1950 (67 y.o. Male) Treating RN: Montey Hora Primary Care Jacion Dismore: Royetta Crochet Other Clinician: Referring Cabrini Ruggieri: Royetta Crochet Treating Broughton Eppinger/Extender: STONE III, HOYT Weeks in Treatment: 11 Wound Status Wound Number: 6 Primary Diabetic Wound/Ulcer of the Lower Extremity Etiology: Wound Location: Right Lower Leg - Anterior, Proximal Wound Open Wounding Event: Gradually Appeared Status: Date Acquired: 04/12/2017 Comorbid Chronic Obstructive Pulmonary Disease Weeks Of Treatment: 11 History: (COPD), Arrhythmia, Deep Vein Thrombosis, Clustered Wound: No Hypertension, Type II Diabetes,  Gout, Neuropathy Photos Photo Uploaded By: Montey Hora on 07/12/2017 11:14:42 Wound Measurements Length: (cm) 0.1 Width: (cm) 0.1 Depth: (cm) 0.1 Area: (cm) 0.008 Volume: (cm) 0.001 % Reduction in Area: 98.4% % Reduction in Volume: 99% Epithelialization: Small (1-33%) Tunneling: No Undermining: No Wound Description Classification: Grade 2 Wound Margin: Flat and Intact Exudate Amount: None Present Foul Odor After Cleansing: No Slough/Fibrino Yes Wound Bed Granulation Amount: None Present (0%) Exposed Structure Necrotic Amount: Large (67-100%) Fascia Exposed: No Necrotic Quality: Eschar Fat Layer (Subcutaneous Tissue) Exposed: Yes Tendon Exposed: No Muscle Exposed: No Joint Exposed: No Bone Exposed: No Periwound Skin Texture Texture Color Gionfriddo, Giann ([id number]) No Abnormalities Noted: No No Abnormalities Noted: No Callus: No Atrophie Blanche: No Crepitus: No Cyanosis: No Excoriation: No Ecchymosis: No Induration: No Erythema: No Rash: No Hemosiderin Staining: No Scarring: Yes Mottled: No Pallor: No Moisture Rubor: No No Abnormalities Noted: No Dry / Scaly: No Temperature / Pain Maceration: No Temperature: No Abnormality Tenderness on Palpation: Yes Wound Preparation Ulcer Cleansing: Rinsed/Irrigated with Saline, Other: soap and water, Topical Anesthetic Applied: Other: lidocaine 4%, Treatment Notes Wound #6 (Right, Proximal, Anterior Lower Leg) 1. Cleansed with: Clean wound with Normal Saline 2. Anesthetic Topical Lidocaine 4% cream to wound bed prior to debridement 3. Peri-wound Care: Moisturizing lotion 4. Dressing Applied: Hydrogel Prisma Ag 5. Secondary Dressing Applied Non-Adherent pad 7. Secured with 3 Layer Compression System - Right Lower Extremity Notes , unna boot to anchor Electronic Signature(s) Signed: 07/12/2017 4:46:41 PM By: Montey Hora Entered By: Montey Hora on 07/12/2017 10:26:19 Montas, [name]  ([id number]) -------------------------------------------------------------------------------- [place] Details Patient Name: [name] Date of Service: 07/12/2017 10:15 AM Medical Record Number: [id number] Patient Account Number: [id number] Date of Birth/Sex: Jul 27, 1950 (67 y.o. Male) Treating RN: Montey Hora Primary Care Terel Bann: Royetta Crochet Other Clinician: Referring Alverta Caccamo: Royetta Crochet Treating Tyhir Schwan/Extender: STONE III, HOYT Weeks in Treatment: 11 Vital Signs Time Taken: 10:17 Temperature (F): 98.6 Height (in): 74 Pulse (bpm): 47 Weight (lbs): 252 Respiratory Rate (breaths/min): 18 Body Mass Index (BMI): 32.4 Blood Pressure (mmHg): 156/82 Reference Range: 80 -  120 mg / dl Electronic Signature(s) Signed: 07/12/2017 4:46:41 PM By: Montey Hora Entered By: Montey Hora on 07/12/2017 10:18:29

## 2017-07-19 ENCOUNTER — Ambulatory Visit: Payer: Medicare Other | Admitting: Physician Assistant

## 2017-07-20 ENCOUNTER — Emergency Department: Payer: Medicare Other

## 2017-07-20 ENCOUNTER — Other Ambulatory Visit: Payer: Self-pay

## 2017-07-20 ENCOUNTER — Encounter: Payer: Self-pay | Admitting: *Deleted

## 2017-07-20 ENCOUNTER — Emergency Department
Admission: EM | Admit: 2017-07-20 | Discharge: 2017-07-20 | Disposition: A | Discharge status: Home / Self Care | Payer: Medicare Other | Attending: Emergency Medicine | Admitting: Emergency Medicine

## 2017-07-20 ENCOUNTER — Encounter: Payer: Medicare Other | Attending: Internal Medicine | Admitting: Internal Medicine

## 2017-07-20 DIAGNOSIS — Z7901 Long term (current) use of anticoagulants: Secondary | ICD-10-CM | POA: Insufficient documentation

## 2017-07-20 DIAGNOSIS — L97513 Non-pressure chronic ulcer of other part of right foot with necrosis of muscle: Secondary | ICD-10-CM | POA: Diagnosis not present

## 2017-07-20 DIAGNOSIS — F1410 Cocaine abuse, uncomplicated: Secondary | ICD-10-CM | POA: Diagnosis not present

## 2017-07-20 DIAGNOSIS — I872 Venous insufficiency (chronic) (peripheral): Secondary | ICD-10-CM | POA: Diagnosis not present

## 2017-07-20 DIAGNOSIS — Z8673 Personal history of transient ischemic attack (TIA), and cerebral infarction without residual deficits: Secondary | ICD-10-CM | POA: Insufficient documentation

## 2017-07-20 DIAGNOSIS — Z86711 Personal history of pulmonary embolism: Secondary | ICD-10-CM | POA: Insufficient documentation

## 2017-07-20 DIAGNOSIS — L97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: Secondary | ICD-10-CM | POA: Insufficient documentation

## 2017-07-20 DIAGNOSIS — I10 Essential (primary) hypertension: Secondary | ICD-10-CM | POA: Insufficient documentation

## 2017-07-20 DIAGNOSIS — F17210 Nicotine dependence, cigarettes, uncomplicated: Secondary | ICD-10-CM | POA: Insufficient documentation

## 2017-07-20 DIAGNOSIS — E1151 Type 2 diabetes mellitus with diabetic peripheral angiopathy without gangrene: Secondary | ICD-10-CM | POA: Diagnosis not present

## 2017-07-20 DIAGNOSIS — Z7982 Long term (current) use of aspirin: Secondary | ICD-10-CM | POA: Insufficient documentation

## 2017-07-20 DIAGNOSIS — E119 Type 2 diabetes mellitus without complications: Secondary | ICD-10-CM | POA: Insufficient documentation

## 2017-07-20 DIAGNOSIS — I4891 Unspecified atrial fibrillation: Secondary | ICD-10-CM | POA: Insufficient documentation

## 2017-07-20 DIAGNOSIS — E11622 Type 2 diabetes mellitus with other skin ulcer: Secondary | ICD-10-CM | POA: Diagnosis not present

## 2017-07-20 DIAGNOSIS — I87331 Chronic venous hypertension (idiopathic) with ulcer and inflammation of right lower extremity: Secondary | ICD-10-CM | POA: Insufficient documentation

## 2017-07-20 DIAGNOSIS — Z79899 Other long term (current) drug therapy: Secondary | ICD-10-CM | POA: Diagnosis not present

## 2017-07-20 DIAGNOSIS — Z86718 Personal history of other venous thrombosis and embolism: Secondary | ICD-10-CM | POA: Insufficient documentation

## 2017-07-20 DIAGNOSIS — E11621 Type 2 diabetes mellitus with foot ulcer: Secondary | ICD-10-CM | POA: Diagnosis not present

## 2017-07-20 DIAGNOSIS — R079 Chest pain, unspecified: Secondary | ICD-10-CM | POA: Insufficient documentation

## 2017-07-20 DIAGNOSIS — R0602 Shortness of breath: Secondary | ICD-10-CM | POA: Insufficient documentation

## 2017-07-20 DIAGNOSIS — M109 Gout, unspecified: Secondary | ICD-10-CM | POA: Diagnosis not present

## 2017-07-20 LAB — CBC
HEMATOCRIT: 41.5 % (ref 40.0–52.0)
Hemoglobin: 13.5 g/dL (ref 13.0–18.0)
MCH: 24.8 pg — ABNORMAL LOW (ref 26.0–34.0)
MCHC: 32.5 g/dL (ref 32.0–36.0)
MCV: 76.2 fL — AB (ref 80.0–100.0)
PLATELETS: 126 10*3/uL — AB (ref 150–440)
RBC: 5.45 MIL/uL (ref 4.40–5.90)
RDW: 16.6 % — AB (ref 11.5–14.5)
WBC: 4 10*3/uL (ref 3.8–10.6)

## 2017-07-20 LAB — TROPONIN I: Troponin I: <0.03 ng/mL (ref <0.03)

## 2017-07-20 LAB — BRAIN NATRIURETIC PEPTIDE: B Natriuretic Peptide: 149 pg/mL — ABNORMAL HIGH (ref 0.0–100.0)

## 2017-07-20 LAB — BASIC METABOLIC PANEL
Anion gap: 11 (ref 5–15)
BUN: 12 mg/dL (ref 6–20)
CHLORIDE: 103 mmol/L (ref 101–111)
CO2: 25 mmol/L (ref 22–32)
CREATININE: 0.78 mg/dL (ref 0.61–1.24)
Calcium: 8.8 mg/dL — ABNORMAL LOW (ref 8.9–10.3)
GFR calc Af Amer: >60 mL/min (ref >60)
GFR calc non Af Amer: >60 mL/min (ref >60)
GLUCOSE: 92 mg/dL (ref 65–99)
Potassium: 3.6 mmol/L (ref 3.5–5.1)
SODIUM: 139 mmol/L (ref 135–145)

## 2017-07-20 LAB — PROTIME-INR
INR: 2.02
Prothrombin Time: 22.7 seconds — ABNORMAL HIGH (ref 11.4–15.2)

## 2017-07-20 NOTE — ED Triage Notes (Signed)
Pt ambulatory to triage.  Pt has chest pain and dizziness for 2 days.  No n/v/d. Pt reports sob.  No diaphoresis.nonradiaiting pain.  Pt alert.  Speech clear.

## 2017-07-20 NOTE — ED Provider Notes (Addendum)
[facility] Emergency Department Provider Note   ____________________________________________   First MD Initiated Contact with Patient 07/20/17 1722     (approximate)  I have reviewed the triage vital signs and the nursing notes.   HISTORY  Chief Complaint Chest Pain    HPI [name] is a 67 y.o. male Patient reports he's had chest pain and some lightheadedness for about 2 days. Chest pain seems to be constant. He short of breath with it. He is not having any sweating. The pain doesn't go anywhere. Patient has had a prior stroke and that makes him have some difficulty with his speech and finding words. He reports he just feels ill like he's got some infection or some other medical problem. He is coughing up some yellow phlegm. He says when he coughs up the phlegm sometimes he feels a little bit better was far as his shortness of breath goes. The chest pain is not severe it is tight is unable tell me if anything makes it better or worse.   Past Medical History:  Diagnosis Date  . Atrial fibrillation ([place])   . Cocaine abuse ([place])    + UDS on admission  . Gout current and history of  . History of deep vein thrombosis 2006  . History of tobacco abuse    has smoked for 50 years  . Hypertension   . Pulmonary embolism ([place])   . Stroke [facility])     Patient Active Problem List   Diagnosis Date Noted  . Arthritis 05/12/2017  . Varicose veins of leg with swelling, bilateral 01/18/2017  . Facial twitching   . Cerebral infarction ([place]) 10/03/2016  . 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 Pneumonia 10/12/2014  . Osteoarthritis of right knee 02/11/2014  . Pain and swelling of knee 01/08/2014  . Pain in extremity 01/08/2014  . Weakness due to cerebrovascular accident 01/08/2014  . Pulmonary embolism ([place]) 06/19/2012  . Gout 09/22/2011  . 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 CVA (cerebral infarction)  04/26/2011  . Diabetes mellitus ([place]) 04/26/2011  . 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 Anemia 04/26/2011  . DVT (deep venous thrombosis) ([place]) 04/26/2011  . Essential (primary) hypertension 04/26/2011    Past Surgical History:  Procedure Laterality Date  . VASCULAR SURGERY     Stent in leg    Prior to Admission medications   Medication Sig Start Date End Date Taking? Authorizing Provider  aspirin 81 MG tablet Take 81 mg by mouth daily.   Yes [provider]  colchicine 0.6 MG tablet Take 0.6 mg by mouth daily.    Yes [provider]  colchicine 0.6 MG tablet Take 2 tablets (1.2mg ) by mouth at first sign of gout flare followed by 1 tablet (0.6mg ) after 1 hour. (Max 1.8mg  within 1 hour).  Cut back if diarrhea develops. 11/14/12  Yes [provider]  Ferrous Sulfate (IRON) 28 MG TABS Take 1 tablet by mouth daily.   Yes [provider]  furosemide (LASIX) 40 MG tablet Take 40 mg by mouth daily.  08/05/15  Yes [provider]  ibuprofen (ADVIL,MOTRIN) 200 MG tablet Take 600 mg by mouth daily.    Yes [provider]  metFORMIN (GLUCOPHAGE) 500 MG tablet Take 500 mg by mouth 2 (two) times daily with a meal.  04/12/17  Yes [provider]  metoprolol tartrate (LOPRESSOR) 25 MG tablet Take 25 mg by mouth 2 (two) times  daily.   Yes [provider]  warfarin (COUMADIN) 1 MG tablet Take 2 mg by mouth daily. Take along with 6 mg tablet daily to total 8 mg.   Yes [provider]  warfarin (COUMADIN) 6 MG tablet Take 6 mg by mouth daily. Take along with 2 mg (1 mg tablets) dose daily to total 8 mg.   Yes [provider]  guaiFENesin-dextromethorphan (ROBITUSSIN DM) 100-10 MG/5ML syrup Take 5 mLs by mouth every 4 (four) hours as needed for cough. Patient not taking: Reported on 07/20/2017 03/04/17   Fritzi Mandes, MD  HYDROcodone-acetaminophen [facility]) 5-325 MG tablet Take 1 tablet by mouth every 4 (four) hours as  needed for moderate pain. Patient not taking: Reported on 10/03/2016 07/18/16   Merlyn Lot, MD  lactulose [facility]) 10 GM/15ML solution Take 30 mLs (20 g total) by mouth daily as needed for mild constipation. Patient not taking: Reported on 10/03/2016 05/11/15   Paulette Blanch, MD  nutrition supplement, Fanny Dance, Fanny Dance) PACK Take 1 packet by mouth 2 (two) times daily between meals. Patient not taking: Reported on 07/20/2017 10/05/16   Demetrios Loll, MD  polyethylene glycol [facility]) packet Take 17 g by mouth daily. Patient not taking: Reported on 03/03/2017 05/14/15   Loney Hering, MD    Allergies Patient has no known allergies.  Family History  Problem Relation Age of Onset  . Aneurysm Mother   . Diabetes Father     Social History Social History   Tobacco Use  . Smoking status: Current Every Day Smoker    Packs/day: 0.50    Years: 50.00    Pack years: 25.00    Types: Cigarettes  . Smokeless tobacco: Never Used  Substance Use Topics  . Alcohol use: No  . Drug use: No    Comment: Urinainary drug screen + cocaine on admit    Review of Systems  Constitutional: No fever/chills Eyes: No visual changes. ENT: No sore throat. Cardiovascular: Denies chest pain. Respiratory: Denies shortness of breath. Gastrointestinal: No abdominal pain.  No nausea, no vomiting.  No diarrhea.  No constipation. Genitourinary: Negative for dysuria. Musculoskeletal: Negative for back pain. Skin: Negative for rash. Neurological: Negative for headaches, new focal weakness  ____________________________________________   PHYSICAL EXAM:  VITAL SIGNS: ED Triage Vitals  Enc Vitals Group     BP 07/20/17 1648 (!) 157/76     Pulse Rate 07/20/17 1648 62     Resp 07/20/17 1648 20     Temp 07/20/17 1648 98 F (36.7 C)     Temp Source 07/20/17 1648 Oral     SpO2 07/20/17 1648 100 %     Weight 07/20/17 1649 245 lb (111.1 kg)     Height 07/20/17 1649 6\' 2"  (1.88 m)     Head Circumference --       Peak Flow --      Pain Score 07/20/17 1649 6     Pain Loc --      Pain Edu? --      Excl. in [place]? --     Constitutional: Alert and oriented. Well appearing and in no acute distress. Eyes: Conjunctivae are normal. . Head: Atraumatic. Nose: No congestion/rhinnorhea. Mouth/Throat: Mucous membranes are moist.  Oropharynx non-erythematous. Neck: No stridor.   Cardiovascular: Normal rate, regular rhythm. Grossly normal heart sounds.  Good peripheral circulation. Respiratory: Normal respiratory effort.  No retractions. Lungs CTAB. Gastrointestinal: Soft and nontender. No distention. No abdominal bruits. No CVA tenderness. Musculoskeletal: No lower extremity tenderness  one plus edema.  No joint effusions. Neurologic:  no change in his baselinel speech and language. No new gross focal neurologic deficits are appreciated.  Skin:  Skin is warm, dry and intact. No rash noted. Psychiatric: Mood and affect are normal. Speech and behavior are normal.  ____________________________________________   LABS (all labs ordered are listed, but only abnormal results are displayed)  Labs Reviewed  BASIC METABOLIC PANEL - Abnormal; Notable for the following components:      Result Value   Calcium 8.8 (*)    All other components within normal limits  CBC - Abnormal; Notable for the following components:   MCV 76.2 (*)    MCH 24.8 (*)    RDW 16.6 (*)    Platelets 126 (*)    All other components within normal limits  BRAIN NATRIURETIC PEPTIDE - Abnormal; Notable for the following components:   B Natriuretic Peptide 149.0 (*)    All other components within normal limits  PROTIME-INR - Abnormal; Notable for the following components:   Prothrombin Time 22.7 (*)    All other components within normal limits  TROPONIN I  TROPONIN I   ____________________________________________  EKG  KG read and interpreted by me shows atrial fibrillation rate of 67 normal axis no apparent acute  changes. ____________________________________________  RADIOLOGY  ED MD interpretation:  I reviewed the chest x-ray myself.chest x-ray shows a large heart  Radiology says no active pulmonary disease  Official radiology report(s): Dg Chest 2 View  Result Date: 07/20/2017 CLINICAL DATA:  Chest pain and dizziness for 2 days EXAM: CHEST - 2 VIEW COMPARISON:  03/03/2017 FINDINGS: Cardiac shadow is mildly enlarged but stable. The lungs are well aerated bilaterally. No focal infiltrate or sizable effusion is seen. Degenerative changes of the thoracic spine are noted. IMPRESSION: No active cardiopulmonary disease. Electronically Signed   By: Inez Catalina M.D.   On: 07/20/2017 17:44    ____________________________________________   PROCEDURES  Procedure(s) performed:   Procedures  Critical Care performed:   ____________________________________________   INITIAL IMPRESSION / ASSESSMENT AND PLAN / ED COURSE  patient is able to lay flat. He is doing well. Troponins have been negative. 1 pulse ox reading od 59% is incorrect. I will send him home and have her follow-up with cardiology. I will have him call them in the morning.  on discharge patient has no chest pain  ----------------------------------------- 9:49 PM on 07/20/2017 -----------------------------------------  On discharge patient also confirms that the chest pain was not brought on by exercise it just came and went without any relation to exercise deep breathing or anything that he could identify.     ____________________________________________   FINAL CLINICAL IMPRESSION(S) / ED DIAGNOSES  Final diagnoses:  Nonspecific chest pain     ED Discharge Orders    None       Note:  This document was prepared using Dragon voice recognition software and may include unintentional dictation errors.    Nena Polio, MD 07/20/17 2141    Nena Polio, MD 07/20/17 2145    Nena Polio, MD 07/20/17  2150

## 2017-07-20 NOTE — Discharge Instructions (Signed)
Please return if you feel [email or web address] Please especially return if you develop worse pain, shortness of breath, fever, vomiting or feel sicker.
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
## 2017-07-22 NOTE — Progress Notes (Signed)
[name] ([id number]) Visit Report for 07/20/2017 Arrival Information Details Patient Name: [name], [name] Date of Service: 07/20/2017 8:00 AM Medical Record Number: [id number] Patient Account Number: [id number] Date of Birth/Sex: 1951-04-22 (67 y.o. Male) Treating RN: Montey Hora Primary Care Mellany Dinsmore: Royetta Crochet Other Clinician: Referring Xxavier Noon: Royetta Crochet Treating Amilio Zehnder/Extender: [name] Dine in Treatment: 12 Visit Information History Since Last Visit Added or deleted any medications: No Patient Arrived: Ambulatory Any new allergies or adverse reactions: No Arrival Time: 08:03 Had a fall or experienced change in No Accompanied By: self activities of daily living that may affect Transfer Assistance: None risk of falls: Patient Identification Verified: Yes Signs or symptoms of abuse/neglect since last visito No Secondary Verification Process Yes Hospitalized since last visit: No Completed: Has Dressing in Place as Prescribed: Yes Patient Requires Transmission-Based No Has Compression in Place as Prescribed: Yes Precautions: Pain Present Now: No Patient Has Alerts: Yes Patient Alerts: Patient on Blood Thinner Coumadin 81mg  Aspirin DM II o Electronic Signature(s) Signed: 07/20/2017 4:38:42 PM By: Montey Hora Entered By: Montey Hora on 07/20/2017 08:08:29 Arthurs, [name] ([id number]) -------------------------------------------------------------------------------- Clinic Level of Care Assessment Details Patient Name: [name] Date of Service: 07/20/2017 8:00 AM Medical Record Number: [id number] Patient Account Number: [id number] Date of Birth/Sex: Feb 16, 1951 (67 y.o. Male) Treating RN: Cornell Barman Primary Care Olive Zmuda: Royetta Crochet Other Clinician: Referring Authur Cubit: Royetta Crochet Treating Shermaine Brigham/Extender: [name] Dine in Treatment: 12 Clinic Level of Care Assessment Items TOOL 4 Quantity Score []  -  Use when only an EandM is performed on FOLLOW-UP visit 0 ASSESSMENTS - Nursing Assessment / Reassessment []  - Reassessment of Co-morbidities (includes updates in patient status) 0 X- 1 5 Reassessment of Adherence to Treatment Plan ASSESSMENTS - Wound and Skin Assessment / Reassessment X - Simple Wound Assessment / Reassessment - one wound 1 5 []  - 0 Complex Wound Assessment / Reassessment - multiple wounds []  - 0 Dermatologic / Skin Assessment (not related to wound area) ASSESSMENTS - Focused Assessment []  - Circumferential Edema Measurements - multi extremities 0 []  - 0 Nutritional Assessment / Counseling / Intervention []  - 0 Lower Extremity Assessment (monofilament, tuning fork, pulses) []  - 0 Peripheral Arterial Disease Assessment (using hand held doppler) ASSESSMENTS - Ostomy and/or Continence Assessment and Care []  - Incontinence Assessment and Management 0 []  - 0 Ostomy Care Assessment and Management (repouching, etc.) PROCESS - Coordination of Care X - Simple Patient / Family Education for ongoing care 1 15 []  - 0 Complex (extensive) Patient / Family Education for ongoing care []  - 0 Staff obtains Programmer, systems, Records, Test Results / Process Orders []  - 0 Staff telephones HHA, Nursing Homes / Clarify orders / etc []  - 0 Routine Transfer to another Facility (non-emergent condition) []  - 0 Routine Hospital Admission (non-emergent condition) []  - 0 New Admissions / Biomedical engineer / Ordering NPWT, Apligraf, etc. []  - 0 Emergency Hospital Admission (emergent condition) X- 1 10 Simple Discharge Coordination Barillas, [name] ([id number]) []  - 0 Complex (extensive) Discharge Coordination PROCESS - Special Needs []  - Pediatric / Minor Patient Management 0 []  - 0 Isolation Patient Management []  - 0 Hearing / Language / Visual special needs []  - 0 Assessment of Community assistance (transportation, D/C planning, etc.) []  - 0 Additional assistance / Altered  mentation []  - 0 Support Surface(s) Assessment (bed, cushion, seat, etc.) INTERVENTIONS - Wound Cleansing / Measurement X - Simple Wound Cleansing - one wound 1 5 []  - 0 Complex Wound Cleansing - multiple wounds X- 1  5 Wound Imaging (photographs - any number of wounds) []  - 0 Wound Tracing (instead of photographs) X- 1 5 Simple Wound Measurement - one wound []  - 0 Complex Wound Measurement - multiple wounds INTERVENTIONS - Wound Dressings []  - Small Wound Dressing one or multiple wounds 0 []  - 0 Medium Wound Dressing one or multiple wounds []  - 0 Large Wound Dressing one or multiple wounds []  - 0 Application of Medications - topical []  - 0 Application of Medications - injection INTERVENTIONS - Miscellaneous []  - External ear exam 0 []  - 0 Specimen Collection (cultures, biopsies, blood, body fluids, etc.) []  - 0 Specimen(s) / Culture(s) sent or taken to Lab for analysis []  - 0 Patient Transfer (multiple staff / Civil Service fast streamer / Similar devices) []  - 0 Simple Staple / Suture removal (25 or less) []  - 0 Complex Staple / Suture removal (26 or more) []  - 0 Hypo / Hyperglycemic Management (close monitor of Blood Glucose) []  - 0 Ankle / Brachial Index (ABI) - do not check if billed separately X- 1 5 Vital Signs Spiegelman, [name] ([id number]) Has the patient been seen at the hospital within the last three years: Yes Total Score: 55 Level Of Care: New/Established - Level 2 Electronic Signature(s) Signed: 07/20/2017 4:24:03 PM By: Gretta Cool, [name], RN, CWS, [name], [name] Entered By: Gretta Cool, [name], RN, CWS, Kim on 07/20/2017 08:39:14 [name], [name] ([id number]) -------------------------------------------------------------------------------- Encounter Discharge Information Details Patient Name: [name] Date of Service: 07/20/2017 8:00 AM Medical Record Number: [id number] Patient Account Number: [id number] Date of Birth/Sex: 07-26-1950 (67 y.o. Male) Treating RN: Cornell Barman Primary Care Jammie Clink: Royetta Crochet Other Clinician: Referring Annikah Lovins: Royetta Crochet Treating Eutimio Gharibian/Extender: [name] Dine in Treatment: 12 Encounter Discharge Information Items Discharge Pain Level: 0 Discharge Condition: Stable Ambulatory Status: Ambulatory Discharge Destination: Home Transportation: Private Auto Accompanied By: self Schedule Follow-up Appointment: Yes Medication Reconciliation completed and No provided to Patient/Care Mak Bonny: Patient Clinical Summary of Care: Declined Electronic Signature(s) Signed: 07/21/2017 11:42:25 AM By: [name] Entered By: [name] on 07/20/2017 08:42:14 [name], [name] ([id number]) -------------------------------------------------------------------------------- Lower Extremity Assessment Details Patient Name: [name] Date of Service: 07/20/2017 8:00 AM Medical Record Number: [id number] Patient Account Number: [id number] Date of Birth/Sex: 16-Jul-1950 (67 y.o. Male) Treating RN: Montey Hora Primary Care Priya Matsen: Royetta Crochet Other Clinician: Referring Lester Crickenberger: Royetta Crochet Treating Ellsie Violette/Extender: Ricard Dillon Weeks in Treatment: 12 Edema Assessment Assessed: [Left: No] [Right: No] E[Left: dema] [Right: :] Calf Left: Right: Point of Measurement: 40 cm From Medial Instep cm 41.9 cm Ankle Left: Right: Point of Measurement: 13 cm From Medial Instep cm 26.8 cm Vascular Assessment Pulses: Dorsalis Pedis Palpable: [Right:Yes] Posterior Tibial Extremity colors, hair growth, and conditions: Extremity Color: [Right:Hyperpigmented] Hair Growth on Extremity: [Right:No] Temperature of Extremity: [Right:Warm] Capillary Refill: [Right:< 3 seconds] Toe Nail Assessment Left: Right: Thick: Yes Discolored: Yes Deformed: Yes Improper Length and Hygiene: No Notes 46 to knee Electronic Signature(s) Signed: 07/20/2017 4:24:03 PM By: Gretta Cool, [name], RN, CWS, [name], [name] Signed:  07/20/2017 4:38:42 PM By: Montey Hora Entered By: Gretta Cool, [name], RN, CWS, Kim on 07/20/2017 08:35:27 [name], [name] ([id number]) -------------------------------------------------------------------------------- Multi Wound Chart Details Patient Name: [name] Date of Service: 07/20/2017 8:00 AM Medical Record Number: [id number] Patient Account Number: [id number] Date of Birth/Sex: [birth year] (67 y.o. Male) Treating RN: Cornell Barman Primary Care Jaedin Regina: Royetta Crochet Other Clinician: Referring Floella Ensz: Royetta Crochet Treating Esaias Cleavenger/Extender: Ricard Dillon Weeks in Treatment: 12 Vital Signs Height(in): 74 Pulse(bpm): 62 Weight(lbs): 252 Blood Pressure(mmHg): 145/82  Body Mass Index(BMI): 32 Temperature(F): 97.9 Respiratory Rate 20 (breaths/min): Photos: [N/A:N/A] Wound Location: Right, Proximal, Anterior N/A N/A Lower Leg Wounding Event: Gradually Appeared N/A N/A Primary Etiology: Diabetic Wound/Ulcer of the N/A N/A Lower Extremity Date Acquired: 04/12/2017 N/A N/A Weeks of Treatment: 12 N/A N/A Wound Status: Open N/A N/A Measurements L x W x D 0x0x0 N/A N/A (cm) Area (cm) : 0 N/A N/A Volume (cm) : 0 N/A N/A % Reduction in Area: 100.00% N/A N/A % Reduction in Volume: 100.00% N/A N/A Classification: Grade 2 N/A N/A Periwound Skin Texture: No Abnormalities Noted N/A N/A Periwound Skin Moisture: No Abnormalities Noted N/A N/A Periwound Skin Color: No Abnormalities Noted N/A N/A Tenderness on Palpation: No N/A N/A Treatment Notes Electronic Signature(s) Signed: 07/20/2017 5:10:26 PM By: [name] MD Entered By: [name] on 07/20/2017 09:16:04 [name], [name] ([id number]) -------------------------------------------------------------------------------- Multi-Disciplinary Care Plan Details Patient Name: [name] Date of Service: 07/20/2017 8:00 AM Medical Record Number: [id number] Patient Account Number: [id number] Date of  Birth/Sex: Dec 18, 1950 (67 y.o. Male) Treating RN: Cornell Barman Primary Care Berneita Sanagustin: Royetta Crochet Other Clinician: Referring Kevontae Burgoon: Royetta Crochet Treating Jeyda Siebel/Extender: Ricard Dillon Weeks in Treatment: 12 Active Inactive Electronic Signature(s) Signed: 07/20/2017 4:24:03 PM By: Gretta Cool, [name], RN, CWS, [name], [name] Entered By: Gretta Cool, [name], RN, CWS, Kim on 07/20/2017 08:37:11 [name], [name] ([id number]) -------------------------------------------------------------------------------- Pain Assessment Details Patient Name: [name] Date of Service: 07/20/2017 8:00 AM Medical Record Number: [id number] Patient Account Number: [id number] Date of Birth/Sex: [birth year] (67 y.o. Male) Treating RN: Montey Hora Primary Care Sulma Ruffino: Royetta Crochet Other Clinician: Referring Chandler Stofer: Royetta Crochet Treating Chang Tiggs/Extender: Ricard Dillon Weeks in Treatment: 12 Active Problems Location of Pain Severity and Description of Pain Patient Has Paino Yes Site Locations Pain Location: Pain in Ulcers With Dressing Change: Yes Duration of the Pain. Constant / Intermittento Intermittent Pain Management and Medication Current Pain Management: Notes Topical or injectable lidocaine is offered to patient for acute pain when surgical debridement is performed. If needed, Patient is instructed to use over the counter pain medication for the following 24-48 hours after debridement. Wound care MDs do not prescribed pain medications. Patient has chronic pain or uncontrolled pain. Patient has been instructed to make an appointment with their Primary Care Physician for pain management. Electronic Signature(s) Signed: 07/20/2017 4:38:42 PM By: Montey Hora Entered By: Montey Hora on 07/20/2017 08:08:46 [name], [name] ([id number]) -------------------------------------------------------------------------------- Patient/Caregiver Education Details Patient Name: [name] Date of Service: 07/20/2017 8:00 AM Medical Record Number: [id number] Patient Account Number: [id number] Date of Birth/Gender: 01-Sep-1950 (67 y.o. Male) Treating RN: Cornell Barman Primary Care Physician: Royetta Crochet Other Clinician: Referring Physician: Royetta Crochet Treating Physician/Extender: [name] Dine in Treatment: 12 Education Assessment Education Provided To: Patient Education Topics Provided Venous: Handouts: Controlling Swelling with Compression Stockings , Other: ETI and measurement information given to patient Methods: Demonstration, Explain/Verbal Responses: State content correctly Electronic Signature(s) Signed: 07/20/2017 4:24:03 PM By: Gretta Cool, [name], RN, CWS, [name], [name] Entered By: Gretta Cool, [name], RN, CWS, Kim on 07/20/2017 08:41:19 [name], [name] ([id number]) -------------------------------------------------------------------------------- Wound Assessment Details Patient Name: [name] Date of Service: 07/20/2017 8:00 AM Medical Record Number: [id number] Patient Account Number: [id number] Date of Birth/Sex: 1950-06-24 (67 y.o. Male) Treating RN: Montey Hora Primary Care Velencia Lenart: Royetta Crochet Other Clinician: Referring Caysie Minnifield: Royetta Crochet Treating Timtohy Broski/Extender: Ricard Dillon Weeks in Treatment: 12 Wound Status Wound Number: 6 Primary Diabetic Wound/Ulcer of the Lower Etiology: Extremity Wound Location: Right, Proximal, Anterior Lower Leg Wound Status: Open Wounding Event:  Gradually Appeared Date Acquired: 04/12/2017 Weeks Of Treatment: 12 Clustered Wound: No Photos Photo Uploaded By: Montey Hora on 07/20/2017 08:24:17 Wound Measurements Length: (cm) 0 % R Width: (cm) 0 % R Depth: (cm) 0 Area: (cm) 0 Volume: (cm) 0 eduction in Area: 100% eduction in Volume: 100% Wound Description Classification: Grade 2 Periwound Skin Texture Texture Color No Abnormalities Noted: No No Abnormalities Noted:  No Moisture No Abnormalities Noted: No Electronic Signature(s) Signed: 07/20/2017 4:38:42 PM By: Montey Hora Entered By: Montey Hora on 07/20/2017 08:19:08 [name], [name] ([id number]) -------------------------------------------------------------------------------- Vitals Details Patient Name: [name] Date of Service: 07/20/2017 8:00 AM Medical Record Number: [id number] Patient Account Number: [id number] Date of Birth/Sex: [birth year] (67 y.o. Male) Treating RN: Montey Hora Primary Care Charell Faulk: Royetta Crochet Other Clinician: Referring Meshulem Onorato: Royetta Crochet Treating Emmalyne Giacomo/Extender: [name] Dine in Treatment: 12 Vital Signs Time Taken: 08:08 Temperature (F): 97.9 Height (in): 74 Pulse (bpm): 62 Weight (lbs): 252 Respiratory Rate (breaths/min): 20 Body Mass Index (BMI): 32.4 Blood Pressure (mmHg): 145/82 Reference Range: 80 - 120 mg / dl Electronic Signature(s) Signed: 07/20/2017 4:38:42 PM By: Montey Hora Entered By: Montey Hora on 07/20/2017 08:09:55

## 2017-08-11 ENCOUNTER — Ambulatory Visit (INDEPENDENT_AMBULATORY_CARE_PROVIDER_SITE_OTHER): Payer: Medicare Other | Admitting: Podiatry

## 2017-08-11 ENCOUNTER — Encounter: Payer: Self-pay | Admitting: Podiatry

## 2017-08-11 DIAGNOSIS — E1142 Type 2 diabetes mellitus with diabetic polyneuropathy: Secondary | ICD-10-CM | POA: Diagnosis not present

## 2017-08-11 DIAGNOSIS — M79676 Pain in unspecified toe(s): Secondary | ICD-10-CM | POA: Diagnosis not present

## 2017-08-11 DIAGNOSIS — B351 Tinea unguium: Secondary | ICD-10-CM | POA: Diagnosis not present

## 2017-08-11 NOTE — Progress Notes (Signed)
Complaint:  Visit Type: Patient returns to my office for continued preventative foot care services. Complaint: Patient states" my nails have grown long and thick and become painful to walk and wear shoes" Patient has been diagnosed with DM with neuropathy.. The patient presents for preventative foot care services. No changes to ROS  Podiatric Exam: Vascular: dorsalis pedis and posterior tibial pulses are palpable bilateral. Capillary return is immediate. Temperature gradient is WNL. Skin turgor WNL .  Venous stasis leg/foot  B/L. Sensorium: Diminished  Semmes Weinstein monofilament test. Normal tactile sensation bilaterally. Nail Exam: Pt has thick disfigured discolored nails with subungual debris noted bilateral entire nail hallux through fifth toenails Ulcer Exam: There is no evidence of ulcer or pre-ulcerative changes or infection. Orthopedic Exam: Muscle tone and strength are WNL. No limitations in general ROM. No crepitus or effusions noted. Foot type and digits show no abnormalities. DJD midfoot right. Skin: No Porokeratosis. No infection or ulcers  Diagnosis:  Onychomycosis, , Pain in right toe, pain in left toes  Treatment & Plan Procedures and Treatment: Consent by patient was obtained for treatment procedures.   Debridement of mycotic and hypertrophic toenails, 1 through 5 bilateral and clearing of subungual debris. No ulceration, no infection noted.  Return Visit-Office Procedure: Patient instructed to return to the office for a follow up visit 3 months for continued evaluation and treatment.    Gardiner Barefoot DPM

## 2017-10-25 ENCOUNTER — Encounter: Payer: Medicare Other | Attending: Physician Assistant | Admitting: Physician Assistant

## 2017-10-25 DIAGNOSIS — E11622 Type 2 diabetes mellitus with other skin ulcer: Secondary | ICD-10-CM | POA: Diagnosis not present

## 2017-10-25 DIAGNOSIS — J449 Chronic obstructive pulmonary disease, unspecified: Secondary | ICD-10-CM | POA: Diagnosis not present

## 2017-10-25 DIAGNOSIS — L97319 Non-pressure chronic ulcer of right ankle with unspecified severity: Secondary | ICD-10-CM | POA: Insufficient documentation

## 2017-10-25 DIAGNOSIS — M109 Gout, unspecified: Secondary | ICD-10-CM | POA: Diagnosis not present

## 2017-10-25 DIAGNOSIS — I890 Lymphedema, not elsewhere classified: Secondary | ICD-10-CM | POA: Diagnosis not present

## 2017-10-25 DIAGNOSIS — Z8673 Personal history of transient ischemic attack (TIA), and cerebral infarction without residual deficits: Secondary | ICD-10-CM | POA: Insufficient documentation

## 2017-10-25 DIAGNOSIS — I872 Venous insufficiency (chronic) (peripheral): Secondary | ICD-10-CM | POA: Diagnosis not present

## 2017-10-25 DIAGNOSIS — E1140 Type 2 diabetes mellitus with diabetic neuropathy, unspecified: Secondary | ICD-10-CM | POA: Diagnosis not present

## 2017-10-25 DIAGNOSIS — L97812 Non-pressure chronic ulcer of other part of right lower leg with fat layer exposed: Secondary | ICD-10-CM | POA: Insufficient documentation

## 2017-10-25 DIAGNOSIS — Z86718 Personal history of other venous thrombosis and embolism: Secondary | ICD-10-CM | POA: Insufficient documentation

## 2017-10-25 DIAGNOSIS — I4891 Unspecified atrial fibrillation: Secondary | ICD-10-CM | POA: Diagnosis not present

## 2017-10-25 DIAGNOSIS — I10 Essential (primary) hypertension: Secondary | ICD-10-CM | POA: Diagnosis not present

## 2017-10-27 NOTE — Progress Notes (Signed)
[name] ([id number]) Visit Report for 10/25/2017 Abuse/Suicide Risk Screen Details Patient Name: [name], [name] Date of Service: 10/25/2017 8:00 AM Medical Record Number: [id number] Patient Account Number: [id number] Date of Birth/Sex: 11-03-1950 (67 y.o. M) Treating RN: Montey Hora Primary Care Brienne Liguori: Royetta Crochet Other Clinician: Referring Kailan Carmen: Royetta Crochet Treating Taison Celani/Extender: STONE III, HOYT Weeks in Treatment: 0 Abuse/Suicide Risk Screen Items Answer ABUSE/SUICIDE RISK SCREEN: Has anyone close to you tried to hurt or harm you recentlyo No Do you feel uncomfortable with anyone in your familyo No Has anyone forced you do things that you didnot want to doo No Do you have any thoughts of harming yourselfo No Patient displays signs or symptoms of abuse and/or neglect. No Electronic Signature(s) Signed: 10/25/2017 3:48:56 PM By: Montey Hora Entered By: Montey Hora on 10/25/2017 08:12:20 Borromeo, [name] ([id number]) -------------------------------------------------------------------------------- Activities of Daily Living Details Patient Name: [name] Date of Service: 10/25/2017 8:00 AM Medical Record Number: [id number] Patient Account Number: [id number] Date of Birth/Sex: [birth year] (67 y.o. M) Treating RN: Montey Hora Primary Care [place]: Royetta Crochet Other Clinician: Referring Fern Canova: Royetta Crochet Treating Urania Pearlman/Extender: STONE III, HOYT Weeks in Treatment: 0 Activities of Daily Living Items Answer Activities of Daily Living (Please select one for each item) Drive Automobile Completely Able Take Medications Completely Able Use Telephone Completely Able Care for Appearance Completely Able Use Toilet Completely Able Bath / Shower Completely Able Dress Self Completely Able Feed Self Completely Able Walk Completely Able Get In / Out Bed Completely Able Housework Completely Able Prepare Meals Completely  [place] for Self Completely Able Electronic Signature(s) Signed: 10/25/2017 3:48:56 PM By: Montey Hora Entered By: Montey Hora on 10/25/2017 08:12:45 Cullinan, [name] ([id number]) -------------------------------------------------------------------------------- Education Assessment Details Patient Name: [name] Date of Service: 10/25/2017 8:00 AM Medical Record Number: [id number] Patient Account Number: [id number] Date of Birth/Sex: May 25, 1950 (67 y.o. M) Treating RN: Montey Hora Primary Care Jeimy Bickert: Royetta Crochet Other Clinician: Referring Kindrick Lankford: Royetta Crochet Treating Mariangela Heldt/Extender: Melburn Hake, HOYT Weeks in Treatment: 0 Primary Learner Assessed: Patient Learning Preferences/Education Level/Primary Language Learning Preference: Explanation, Demonstration Highest Education Level: High School Preferred Language: English Cognitive Barrier Assessment/Beliefs Language Barrier: No Translator Needed: No Memory Deficit: No Emotional Barrier: No Cultural/Religious Beliefs Affecting Medical Care: No Physical Barrier Assessment Impaired Vision: No Impaired Hearing: No Decreased Hand dexterity: No Knowledge/Comprehension Assessment Knowledge Level: Medium Comprehension Level: Medium Ability to understand written Medium instructions: Ability to understand verbal Medium instructions: Motivation Assessment Anxiety Level: Calm Cooperation: Cooperative Education Importance: Acknowledges Need Interest in Health Problems: Asks Questions Perception: Coherent Willingness to Engage in Self- Medium Management Activities: Readiness to Engage in Self- Medium Management Activities: Electronic Signature(s) Signed: 10/25/2017 3:48:56 PM By: Montey Hora Entered By: Montey Hora on 10/25/2017 08:13:09 Lugar, [name] ([id number]) -------------------------------------------------------------------------------- Fall Risk  Assessment Details Patient Name: [name] Date of Service: 10/25/2017 8:00 AM Medical Record Number: [id number] Patient Account Number: [id number] Date of Birth/Sex: 01-Nov-1950 (67 y.o. M) Treating RN: Montey Hora Primary Care Ellieana Dolecki: Royetta Crochet Other Clinician: Referring [place]: Royetta Crochet Treating Claira Jeter/Extender: Melburn Hake, HOYT Weeks in Treatment: 0 Fall Risk Assessment Items Have you had 2 or more falls in the last 12 monthso 0 No Have you had any fall that resulted in injury in the last 12 monthso 0 No FALL RISK ASSESSMENT: History of falling - immediate or within 3 months 0 No Secondary diagnosis 0 No Ambulatory aid None/bed rest/wheelchair/nurse 0 Yes Crutches/cane/walker 0 No Furniture 0 No IV Access/Saline Lock 0 No Gait/Training Normal/bed  rest/immobile 0 No Weak 10 Yes Impaired 0 No Mental Status Oriented to own ability 0 Yes Electronic Signature(s) Signed: 10/25/2017 3:48:56 PM By: Montey Hora Entered By: Montey Hora on 10/25/2017 08:13:18 Xia, [name] ([id number]) -------------------------------------------------------------------------------- Foot Assessment Details Patient Name: [name] Date of Service: 10/25/2017 8:00 AM Medical Record Number: [id number] Patient Account Number: [id number] Date of Birth/Sex: [birth year] (67 y.o. M) Treating RN: Montey Hora Primary Care Anavey Coombes: Royetta Crochet Other Clinician: Referring Tacora Athanas: Royetta Crochet Treating Taline Nass/Extender: STONE III, HOYT Weeks in Treatment: 0 Foot Assessment Items Site Locations + = Sensation present, - = Sensation absent, C = Callus, U = Ulcer R = Redness, W = Warmth, M = Maceration, PU = Pre-ulcerative lesion F = Fissure, S = Swelling, D = Dryness Assessment Right: Left: Other Deformity: No No Prior Foot Ulcer: No No Prior Amputation: No No Charcot Joint: No No Ambulatory Status: Ambulatory Without Help Gait: Steady Electronic  Signature(s) Signed: 10/25/2017 3:48:56 PM By: Montey Hora Entered By: Montey Hora on 10/25/2017 08:13:48 Terrill, [name] ([id number]) -------------------------------------------------------------------------------- Nutrition Risk Assessment Details Patient Name: [name] Date of Service: 10/25/2017 8:00 AM Medical Record Number: [id number] Patient Account Number: [id number] Date of Birth/Sex: May 31, 1950 (67 y.o. M) Treating RN: Montey Hora Primary Care Kurt Azimi: Royetta Crochet Other Clinician: Referring Armon Orvis: Royetta Crochet Treating Deetra Booton/Extender: STONE III, HOYT Weeks in Treatment: 0 Height (in): 74 Weight (lbs): 252 Body Mass Index (BMI): 32.4 Nutrition Risk Assessment Items NUTRITION RISK SCREEN: I have an illness or condition that made me change the kind and/or amount of 0 No food I eat I eat fewer than two meals per day 0 No I eat few fruits and vegetables, or milk products 0 No I have three or more drinks of beer, liquor or wine almost every day 0 No I have tooth or mouth problems that make it hard for me to eat 0 No I don't always have enough money to buy the food I need 0 No I eat alone most of the time 0 No I take three or more different prescribed or over-the-counter drugs a day 1 Yes Without wanting to, I have lost or gained 10 pounds in the last six months 0 No I am not always physically able to shop, cook and/or feed myself 0 No Nutrition Protocols Good Risk Protocol 0 No interventions needed Moderate Risk Protocol Electronic Signature(s) Signed: 10/25/2017 3:48:56 PM By: Montey Hora Entered By: Montey Hora on 10/25/2017 08:13:25

## 2017-10-27 NOTE — Progress Notes (Signed)
[name] ([id number]) Visit Report for 10/25/2017 Allergy List Details Patient Name: [name], [name] Date of Service: 10/25/2017 8:00 AM Medical Record Number: [id number] Patient Account Number: [id number] Date of Birth/Sex: [birth year] (66 y.o. M) Treating RN: Montey Hora Primary Care [name]: Royetta Crochet Other Clinician: Referring Morgon Pamer: Royetta Crochet Treating Chayce Robbins/Extender: STONE III, HOYT Weeks in Treatment: 0 Allergies Active Allergies No Known Allergies Allergy Notes Electronic Signature(s) Signed: 10/25/2017 3:48:56 PM By: Montey Hora Entered By: Montey Hora on 10/25/2017 08:10:07 Mahtomedi, [name] ([id number]) -------------------------------------------------------------------------------- Arrival Information Details Patient Name: [name] Date of Service: 10/25/2017 8:00 AM Medical Record Number: [id number] Patient Account Number: [id number] Date of Birth/Sex: 1951-01-03 (66 y.o. M) Treating RN: Montey Hora Primary Care Judaea Burgoon: Royetta Crochet Other Clinician: Referring Jovi Zavadil: Royetta Crochet Treating Liandra Mendia/Extender: Melburn Hake, HOYT Weeks in Treatment: 0 Visit Information Patient Arrived: Ambulatory Arrival Time: 08:08 Accompanied By: self Transfer Assistance: None Patient Identification Verified: Yes Secondary Verification Process Yes Completed: Patient Has Alerts: Yes Patient Alerts: Patient on Blood Thinner DMII warfarin History Since Last Visit Added or deleted any medications: No Any new allergies or adverse reactions: No Had a fall or experienced change in activities of daily living that may affect risk of falls: No Signs or symptoms of abuse/neglect since last visito No Hospitalized since last visit: No Implantable device outside of the clinic excluding cellular tissue based products placed in the center since last visit: No Has Compression in Place as Prescribed: Yes Electronic Signature(s) Signed:  10/25/2017 3:48:56 PM By: Montey Hora Entered By: Montey Hora on 10/25/2017 08:09:37 Bencomo, [name] ([id number]) -------------------------------------------------------------------------------- Clinic Level of Care Assessment Details Patient Name: [name] Date of Service: 10/25/2017 8:00 AM Medical Record Number: [id number] Patient Account Number: [id number] Date of Birth/Sex: 1950-06-22 (66 y.o. M) Treating RN: Ahmed Prima Primary Care Ivaan Liddy: Royetta Crochet Other Clinician: Referring Renesme Kerrigan: Royetta Crochet Treating Neddie Steedman/Extender: STONE III, HOYT Weeks in Treatment: 0 Clinic Level of Care Assessment Items TOOL 1 Quantity Score X - Use when EandM and Procedure is performed on INITIAL visit 1 0 ASSESSMENTS - Nursing Assessment / Reassessment X - General Physical Exam (combine w/ comprehensive assessment (listed just below) when 1 20 performed on new pt. evals) X- 1 25 Comprehensive Assessment (HX, ROS, Risk Assessments, Wounds Hx, etc.) ASSESSMENTS - Wound and Skin Assessment / Reassessment []  - Dermatologic / Skin Assessment (not related to wound area) 0 ASSESSMENTS - Ostomy and/or Continence Assessment and Care []  - Incontinence Assessment and Management 0 []  - 0 Ostomy Care Assessment and Management (repouching, etc.) PROCESS - Coordination of Care X - Simple Patient / Family Education for ongoing care 1 15 []  - 0 Complex (extensive) Patient / Family Education for ongoing care []  - 0 Staff obtains Programmer, systems, Records, Test Results / Process Orders []  - 0 Staff telephones HHA, Nursing Homes / Clarify orders / etc []  - 0 Routine Transfer to another Facility (non-emergent condition) []  - 0 Routine Hospital Admission (non-emergent condition) X- 1 15 New Admissions / Biomedical engineer / Ordering NPWT, Apligraf, etc. []  - 0 Emergency Hospital Admission (emergent condition) PROCESS - Special Needs []  - Pediatric / Minor Patient Management  0 []  - 0 Isolation Patient Management []  - 0 Hearing / Language / Visual special needs []  - 0 Assessment of Community assistance (transportation, D/C planning, etc.) []  - 0 Additional assistance / Altered mentation []  - 0 Support Surface(s) Assessment (bed, cushion, seat, etc.) Myricks, Abdulah ([id number]) INTERVENTIONS - Miscellaneous []  - External ear exam 0 []  - 0 Patient Transfer (  multiple staff / Harrel Lemon Lift / Similar devices) []  - 0 Simple Staple / Suture removal (25 or less) []  - 0 Complex Staple / Suture removal (26 or more) []  - 0 Hypo/Hyperglycemic Management (do not check if billed separately) X- 1 15 Ankle / Brachial Index (ABI) - do not check if billed separately Has the patient been seen at the hospital within the last three years: Yes Total Score: 90 Level Of Care: New/Established - Level 3 Electronic Signature(s) Signed: 10/25/2017 4:32:24 PM By: Alric Quan Entered By: Alric Quan on 10/25/2017 16:04:22 Vonruden, [name] ([id number]) -------------------------------------------------------------------------------- Encounter Discharge Information Details Patient Name: [name] Date of Service: 10/25/2017 8:00 AM Medical Record Number: [id number] Patient Account Number: [id number] Date of Birth/Sex: Aug 26, 1950 (66 y.o. M) Treating RN: Roger Shelter Primary Care Amiera Herzberg: Royetta Crochet Other Clinician: Referring Adahlia Stembridge: Royetta Crochet Treating Nysia Dell/Extender: Melburn Hake, HOYT Weeks in Treatment: 0 Encounter Discharge Information Items Discharge Condition: Stable Ambulatory Status: Ambulatory Discharge Destination: Home Transportation: Private Auto Schedule Follow-up Appointment: Yes Clinical Summary of Care: Electronic Signature(s) Signed: 10/25/2017 11:44:07 AM By: Roger Shelter Entered By: Roger Shelter on 10/25/2017 09:30:37 Romney, [name]  ([id number]) -------------------------------------------------------------------------------- Lower Extremity Assessment Details Patient Name: [name] Date of Service: 10/25/2017 8:00 AM Medical Record Number: [id number] Patient Account Number: [id number] Date of Birth/Sex: 08-15-1950 (66 y.o. M) Treating RN: Montey Hora Primary Care Harold Moncus: Royetta Crochet Other Clinician: Referring Consuelo Thayne: Royetta Crochet Treating Freman Lapage/Extender: Melburn Hake, HOYT Weeks in Treatment: 0 Edema Assessment Assessed: [Left: No] [Right: No] E[Left: dema] [Right: :] Calf Left: Right: Point of Measurement: 40 cm From Medial Instep 45.8 cm 45.5 cm Ankle Left: Right: Point of Measurement: 14 cm From Medial Instep 30.6 cm 30.5 cm Vascular Assessment Pulses: Dorsalis Pedis Palpable: [Left:Yes] [Right:Yes] Doppler Audible: [Left:Yes] [Right:Yes] Posterior Tibial Palpable: [Left:No] [Right:No] Doppler Audible: [Left:Yes] [Right:Yes] Extremity colors, hair growth, and conditions: Extremity Color: [Left:Hyperpigmented] [Right:Hyperpigmented] Hair Growth on Extremity: [Left:No] [Right:No] Temperature of Extremity: [Left:Warm] [Right:Warm] Capillary Refill: [Left:< 3 seconds] [Right:< 3 seconds] Blood Pressure: Brachial: [Left:148] [Right:144] Dorsalis Pedis: 168 [Left:Dorsalis Pedis: 789] Ankle: Posterior Tibial: 162 [Left:Posterior Tibial: 1.14] [Right:1.16] Toe Nail Assessment Left: Right: Thick: Yes Yes Discolored: Yes Yes Deformed: No No Improper Length and Hygiene: No No Electronic Signature(s) Signed: 10/25/2017 3:48:56 PM By: Montey Hora Entered By: Montey Hora on 10/25/2017 08:31:59 Kiesling, [name] ([id number]) Diles, [name] ([id number]) -------------------------------------------------------------------------------- Multi Wound Chart Details Patient Name: [name] Date of Service: 10/25/2017 8:00 AM Medical Record Number: [id number] Patient  Account Number: [id number] Date of Birth/Sex: 05-25-1950 (67 y.o. 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 Secondary Etiology: Lymphedema Lymphedema N/A Comorbid History: Chronic Obstructive Chronic Obstructive N/A Pulmonary Disease (COPD), Pulmonary Disease (COPD), Arrhythmia, Deep Vein Arrhythmia, Deep Vein Thrombosis, Hypertension, Thrombosis, Hypertension, Type II Diabetes, Gout, Type II Diabetes, Gout, Neuropathy Neuropathy Date Acquired: 10/17/2017 10/17/2017 N/A Weeks of Treatment: 0 0 N/A Wound Status: Open Open N/A Measurements L x W x D 0.7x0.3x0.1 0.7x0.6x0.2 N/A (cm) Area (cm) : 0.165 0.33 N/A Volume (cm) : 0.016 0.066 N/A Classification: Grade 1 Grade 1 N/A  Exudate Amount: Medium Medium N/A Exudate Type: Serous Serous N/A Exudate Color: amber amber N/A Wound Margin: Flat and Intact Flat and Intact N/A Granulation Amount: Medium (34-66%) Small (1-33%) N/A Granulation Quality: Pink Pink N/A Necrotic Amount: Medium (34-66%) Large (67-100%) N/A Exposed Structures: Fascia: No Fat Layer (Subcutaneous N/A Fat Layer (Subcutaneous Tissue) Exposed: Yes Arthurs, Clebert ([id number]) Tissue) Exposed: No Fascia: No Tendon: No Tendon: No Muscle: No Muscle: No Joint: No Joint: No Bone: No Bone: No Epithelialization: Small (1-33%) None N/A Periwound Skin Texture: Excoriation:  No Excoriation: No N/A Induration: No Induration: No Callus: No Callus: No Crepitus: No Crepitus: No Rash: No Rash: No Scarring: No Scarring: No Periwound Skin Moisture: Maceration: No Maceration: No N/A Dry/Scaly: No Dry/Scaly: No Periwound Skin Color: Atrophie Blanche: No Atrophie Blanche: No N/A Cyanosis: No Cyanosis: No Ecchymosis: No Ecchymosis: No Erythema: No Erythema: No Hemosiderin Staining: No Hemosiderin Staining: No Mottled: No Mottled: No Pallor: No Pallor: No Rubor: No Rubor: No Temperature: No Abnormality No Abnormality N/A Tenderness on Palpation: Yes Yes N/A Wound Preparation: Ulcer Cleansing: Ulcer Cleansing: N/A Rinsed/Irrigated with Saline Rinsed/Irrigated with Saline Topical Anesthetic Applied: Topical Anesthetic Applied: Other: lidocaine 4% Other: lidocaine 4% Treatment Notes Electronic Signature(s) Signed: 10/25/2017 4:32:24 PM By: Alric Quan Entered By: Alric Quan on 10/25/2017 09:00:56 Omdahl, [name] ([id number]) -------------------------------------------------------------------------------- Multi-Disciplinary Care Plan Details Patient Name: [name] Date of Service: 10/25/2017 8:00 AM Medical Record Number: [id number] Patient Account Number: [id number] Date of Birth/Sex: 06/07/1950 (66 y.o. M) Treating RN: Ahmed Prima Primary Care Hondo Nanda: Royetta Crochet Other Clinician: Referring Cherrie Franca: Royetta Crochet Treating Tallin Hart/Extender: Melburn Hake, HOYT Weeks in Treatment: 0 Active Inactive ` Abuse / Safety / Falls / Self Care Management Nursing Diagnoses: Potential for falls Goals: Patient will not experience any injury related to falls Date Initiated: 10/25/2017 Target Resolution Date: 02/18/2018 Goal Status: Active Interventions: Assess Activities of Daily Living upon admission and as needed Assess fall risk on admission and as needed Assess: immobility, friction, shearing, incontinence upon  admission and as needed Assess impairment of mobility on admission and as needed per policy Assess personal safety and home safety (as indicated) on admission and as needed Assess self care needs on admission and as needed Notes: ` Nutrition Nursing Diagnoses: Imbalanced nutrition Impaired glucose control: actual or potential Potential for alteratiion in Nutrition/Potential for imbalanced nutrition Goals: Patient/caregiver agrees to and verbalizes understanding of need to use nutritional supplements and/or vitamins as prescribed Date Initiated: 10/25/2017 Target Resolution Date: 02/18/2018 Goal Status: Active Patient/caregiver will maintain therapeutic glucose control Date Initiated: 10/25/2017 Target Resolution Date: 02/18/2018 Goal Status: Active Interventions: Assess patient nutrition upon admission and as needed per policy Provide education on elevated blood sugars and impact on wound healing Provide education on nutrition BINNIE, VONDERHAAR ([id number]) Notes: ` Orientation to the Wound Care Program Nursing Diagnoses: Knowledge deficit related to the wound healing center program Goals: Patient/caregiver will verbalize understanding of the [place] Program Date Initiated: 10/25/2017 Target Resolution Date: 11/19/2017 Goal Status: Active Interventions: Provide education on orientation to the wound center Notes: ` Wound/Skin Impairment Nursing Diagnoses: Impaired tissue integrity Knowledge deficit related to ulceration/compromised skin integrity Goals: Ulcer/skin breakdown will have a volume reduction of 80% by week 12 Date Initiated: 10/25/2017 Target Resolution Date: 02/11/2018 Goal Status: Active Interventions: Assess patient/caregiver ability to perform ulcer/skin care regimen upon admission and as needed Assess ulceration(s) every visit Notes: Electronic Signature(s) Signed: 10/25/2017 4:32:24 PM By: Alric Quan Entered By: Alric Quan on  10/25/2017 09:00:40 Mazzola, [name] ([id number]) --------------------------------------------------------------------------------  Pain Assessment Details Patient Name: COULSON, WEHNER Date of Service: 10/25/2017 8:00 AM Medical Record Number: [id number] Patient Account Number: [id number] Date of Birth/Sex: February 18, 1951 (66 y.o. M) Treating RN: Montey Hora Primary Care Kimberlyn Quiocho: Royetta Crochet Other Clinician: Referring Merla Sawka: Royetta Crochet Treating Kein Carlberg/Extender: STONE III, HOYT Weeks in Treatment: 0 Active Problems Location of Pain Severity and Description of Pain Patient Has Paino Yes Site Locations Pain Location: Pain in Ulcers With Dressing Change: Yes Duration of the Pain. Constant / Intermittento Constant Pain Management and Medication Current Pain Management: Electronic Signature(s) Signed: 10/25/2017 3:48:56 PM By: Montey Hora Entered By: Montey Hora on 10/25/2017 08:09:57 Mcjunkins, [name] ([id number]) -------------------------------------------------------------------------------- Patient/Caregiver Education Details Patient Name: [name] Date of Service: 10/25/2017 8:00 AM Medical Record Number: [id number] Patient Account Number: [id number] Date of Birth/Gender: 05-11-1951 (66 y.o. M) Treating RN: Roger Shelter Primary Care Physician: Royetta Crochet Other Clinician: Referring Physician: Royetta Crochet Treating Physician/Extender: Melburn Hake, HOYT Weeks in Treatment: 0 Education Assessment Education Provided To: Patient Education Topics Provided Wound Debridement: Handouts: Wound Debridement Methods: Explain/Verbal Responses: State content correctly Wound/Skin Impairment: Handouts: Caring for Your Ulcer Methods: Explain/Verbal Responses: State content correctly Electronic Signature(s) Signed: 10/25/2017 11:44:07 AM By: Roger Shelter Entered By: Roger Shelter on 10/25/2017 09:31:22 [place], [name]  ([id number]) -------------------------------------------------------------------------------- Wound Assessment Details Patient Name: [name] Date of Service: 10/25/2017 8:00 AM Medical Record Number: [id number] Patient Account Number: [id number] Date of Birth/Sex: 11-12-1950 (66 y.o. M) Treating RN: Montey Hora Primary Care Jeevan Kalla: Royetta Crochet Other Clinician: Referring Juliannah Ohmann: Royetta Crochet Treating Elizah Mierzwa/Extender: STONE III, HOYT Weeks in Treatment: 0 Wound Status Wound Number: 8 Primary Diabetic Wound/Ulcer of the Lower Extremity Etiology: Wound Location: Right Lower Leg - Medial, Proximal Secondary Lymphedema Wounding Event: Gradually Appeared Etiology: Date Acquired: 10/17/2017 Wound Open Weeks Of Treatment: 0 Status: Clustered Wound: No Comorbid Chronic Obstructive Pulmonary Disease History: (COPD), Arrhythmia, Deep Vein Thrombosis, Hypertension, Type II Diabetes, Gout, Neuropathy Photos Photo Uploaded By: Montey Hora on 10/25/2017 08:34:22 Wound Measurements Length: (cm) 0.7 Width: (cm) 0.3 Depth: (cm) 0.1 Area: (cm) 0.165 Volume: (cm) 0.016 % Reduction in Area: % Reduction in Volume: Epithelialization: Small (1-33%) Tunneling: No Undermining: No Wound Description Classification: Grade 1 Wound Margin: Flat and Intact Exudate Amount: Medium Exudate Type: Serous Exudate Color: amber Foul Odor After Cleansing: No Slough/Fibrino Yes Wound Bed Granulation Amount: Medium (34-66%) Exposed Structure Granulation Quality: Pink Fascia Exposed: No Necrotic Amount: Medium (34-66%) Fat Layer (Subcutaneous Tissue) Exposed: No Necrotic Quality: Adherent Slough Tendon Exposed: No Muscle Exposed: No Joint Exposed: No Endsley, Jeury ([id number]) Bone Exposed: No Periwound Skin Texture Texture Color No Abnormalities Noted: No No Abnormalities Noted: No Callus: No Atrophie Blanche: No Crepitus: No Cyanosis: No Excoriation:  No Ecchymosis: No Induration: No Erythema: No Rash: No Hemosiderin Staining: No Scarring: No Mottled: No Pallor: No Moisture Rubor: No No Abnormalities Noted: No Dry / Scaly: No Temperature / Pain Maceration: No Temperature: No Abnormality Tenderness on Palpation: Yes Wound Preparation Ulcer Cleansing: Rinsed/Irrigated with Saline Topical Anesthetic Applied: Other: lidocaine 4%, Treatment Notes Wound #8 (Right, Proximal, Medial Lower Leg) 1. Cleansed with: Clean wound with Normal Saline 2. Anesthetic Topical Lidocaine 4% cream to wound bed prior to debridement 3. Peri-wound Care: Moisturizing lotion 4. Dressing Applied: Other dressing (specify in notes) 5. Secondary Dressing Applied ABD Pad 7. Secured with 3 Layer Compression System - Right Lower Extremity Notes silvercell, drawtex, abd pad, unna to anchor Electronic Signature(s) Signed: 10/25/2017 3:48:56 PM By: Montey Hora Entered By: Montey Hora on 10/25/2017 08:20:51 Thorpe,  [name] ([id number]) -------------------------------------------------------------------------------- Wound Assessment Details Patient Name: WISTER, HOEFLE Date of Service: 10/25/2017 8:00 AM Medical Record Number: [id number] Patient Account Number: [id number] Date of Birth/Sex: 1950/07/18 (66 y.o. M) Treating RN: Montey Hora Primary Care Khamya Topp: Royetta Crochet Other Clinician: Referring Melani Brisbane: Royetta Crochet Treating Mickle Campton/Extender: STONE III, HOYT Weeks in Treatment: 0 Wound Status Wound Number: 9 Primary Diabetic Wound/Ulcer of the Lower Extremity Etiology: Wound Location: Right Lower Leg - Medial, Distal Secondary Lymphedema Wounding Event: Gradually Appeared Etiology: Date Acquired: 10/17/2017 Wound Open Weeks Of Treatment: 0 Status: Clustered Wound: No Comorbid Chronic Obstructive Pulmonary Disease History: (COPD), Arrhythmia, Deep Vein Thrombosis, Hypertension, Type II Diabetes,  Gout, Neuropathy Photos Photo Uploaded By: Montey Hora on 10/25/2017 08:34:22 Wound Measurements Length: (cm) 0.7 Width: (cm) 0.6 Depth: (cm) 0.2 Area: (cm) 0.33 Volume: (cm) 0.066 % Reduction in Area: % Reduction in Volume: Epithelialization: None Tunneling: No Undermining: No Wound Description Classification: Grade 1 Wound Margin: Flat and Intact Exudate Amount: Medium Exudate Type: Serous Exudate Color: amber Foul Odor After Cleansing: No Slough/Fibrino Yes Wound Bed Granulation Amount: Small (1-33%) Exposed Structure Granulation Quality: Pink Fascia Exposed: No Necrotic Amount: Large (67-100%) Fat Layer (Subcutaneous Tissue) Exposed: Yes Necrotic Quality: Adherent Slough Tendon Exposed: No Muscle Exposed: No Joint Exposed: No Jenniges, Camren ([id number]) Bone Exposed: No Periwound Skin Texture Texture Color No Abnormalities Noted: No No Abnormalities Noted: No Callus: No Atrophie Blanche: No Crepitus: No Cyanosis: No Excoriation: No Ecchymosis: No Induration: No Erythema: No Rash: No Hemosiderin Staining: No Scarring: No Mottled: No Pallor: No Moisture Rubor: No No Abnormalities Noted: No Dry / Scaly: No Temperature / Pain Maceration: No Temperature: No Abnormality Tenderness on Palpation: Yes Wound Preparation Ulcer Cleansing: Rinsed/Irrigated with Saline Topical Anesthetic Applied: Other: lidocaine 4%, Treatment Notes Wound #9 (Right, Distal, Medial Lower Leg) 1. Cleansed with: Clean wound with Normal Saline 2. Anesthetic Topical Lidocaine 4% cream to wound bed prior to debridement 3. Peri-wound Care: Moisturizing lotion 4. Dressing Applied: Other dressing (specify in notes) 5. Secondary Dressing Applied ABD Pad 7. Secured with 3 Layer Compression System - Right Lower Extremity Notes silvercell, drawtex, abd pad, unna to anchor Electronic Signature(s) Signed: 10/25/2017 3:48:56 PM By: Montey Hora Entered By: Montey Hora on 10/25/2017 08:22:01 Marton, [name] ([id number]) -------------------------------------------------------------------------------- Vitals Details Patient Name: [name] Date of Service: 10/25/2017 8:00 AM Medical Record Number: [id number] Patient Account Number: [id number] Date of Birth/Sex: 1951/01/09 (67 y.o. M) Treating RN: Montey Hora Primary Care Evert Wenrich: Royetta Crochet Other Clinician: Referring Falyn Rubel: Royetta Crochet Treating Biannca Scantlin/Extender: STONE III, HOYT Weeks in Treatment: 0 Vital Signs Time Taken: 08:14 Temperature (F): 97.7 Height (in): 74 Pulse (bpm): 51 Source: Measured Respiratory Rate (breaths/min): 18 Weight (lbs): 236 Blood Pressure (mmHg): 150/85 Source: Measured Reference Range: 80 - 120 mg / dl Body Mass Index (BMI): 30.3 Electronic Signature(s) Signed: 10/25/2017 3:48:56 PM By: Montey Hora Entered By: Montey Hora on 10/25/2017 08:17:05

## 2017-10-27 NOTE — Progress Notes (Signed)
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 Hydrofera Blue 12/07/16; continue dramatic progress. Wound is now small and healthy looking. Continue use of Hydrofera Blue 12/14/16; very small wound albeit with some depth. Continued use of Hydrofera blue. 12/21/16 on evaluation today patient's right lower extremity ulcer appears healed at this point. He is having no discomfort and overall this is doing very well. And there's no sign of infection. 04/26/17; READMISSION Patient we know from previous stays in this clinic. The patient has known diabetic PAD and has a stent in the right leg. 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 When he was last in the clinic in August we had healed him out for what was felt to be a mostly venous insufficiency wound on the medial right leg. He follows with vascular surgery Dr. dew is at [place] [facility]. He has previously undergone successful laser ablation. Reflux studies on 4/24 showed reflux in the common femoral, femoral and popliteal. He underwent successful ablation of the right greater saphenous vein from the distal thigh to the mid calf level. Successful ablation of the right small saphenous vein. He had unsuccessful ablation of the right greater saphenous vein from the saphenofemoral junction to the distal 5 level. The patient wears to let her compression stockings and he has been compliant with this With regards to his diabetes he is not currently on any treatment. He does describe pain in his leg which forces him to stop although I couldn't really quantify this. His last arterial studies were on 07/05/16 which showed triphasic waveforms on the right to the level of the popliteal artery. The right posterior tibial and anterior tibial artery were not visualized on this study. I don't actually see ABIs or TBIs. 05/04/17; the patient has small wounds on  the right medial heel and the right medial leg. The area on the foot is just about closed. The area on the right medial leg is improved. He is tolerating the 3 layer compression without any complaints. The patient has both known PAD and chronic venous reflux [see discussion above]. 05/11/17; the areas on the patient's medial right heel with healed he still has an open area on the right medial leg. This does not require debridement today I think I did read it last week. We are using silver collagen under 3 layer compression. 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 He has been tolerating the dressing changes without complication. With that being said he does have some epithelialization growing into the wound bed and it appears this wound may be trying to healed in a depressed fashion. To be honest that is not the worst case scenario and I think this may definitely do fine even if it heals in that way. There was no requirement for debridement today as patient appears to be doing very well in regard to his ulcer. His swelling was a little bit more severe than last week but nothing too significant. 07/05/17 on evaluation today patient appears to be doing rather well in regard to his right medial lower extremity ulcer. The wound does appear to be healing although it is healing and somewhat of a cratered fashion the good news is he has excellent epithelium noted. Fortunately there does not appear to be any evidence of infection at this point he is tolerating the dressings as well is the wrap well. 07/12/17 on evaluation today patient appears to be doing very well in regard to his left medial lower extremity ulcer. He has been tolerating the dressing changes without complication. In fact compared to last week this wound which was  somewhat concave has fielding dramatically and looks to be doing excellent. I'm extremely happy with the progress. He still has pain rated to be a 4-5/10 but the wound looks great. 07/20/17; the patient's right medial lower extremity ulcer is closed. He has good edema control. Unfortunately he does not really have adequate compression stockings. Certainly what  he brought in the blood on his right leg might of been support hose however there is whole in the stocking already Readmission: 10/25/17 patient seen today for reevaluation due to a recurrence of the right medial ankle venous leg ulcer. 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 He has been treated with compression stockings for some time now in fact we have notes having treated him even as far back as 2016 here in our clinic. Fortunately he has excellent blood flow and tends to heal well. Unfortunately despite wearing his compression stockings, keeping his legs elevated, being on fluid pills (Lasix) and doing everything he can to try to help with fluid he still continues to have issues with this reopening. I think that he may be a good candidate for compression/lymphedema pumps. We have never attempted to get these for him previously. No fevers, chills, nausea, or vomiting noted at this time. Currently patient states his ulcer does hurt although not as bad as some in the past it's also not as large as it has been in the past when it reopened he talked is somewhat early. Obviously this is good news. Hartsough, [name] ([id number]) Wound History Patient presents with 1 open wound that has been present for approximately 1 week. Patient has been treating wound in the following manner: lotion. The wound has been healed in the past but has re-opened. Laboratory tests have not been performed in the last month. Patient reportedly has not tested positive for an antibiotic  resistant organism. Patient reportedly has not tested positive for osteomyelitis. Patient reportedly has had testing performed to evaluate circulation in the legs. Patient History Information obtained from Patient. Allergies No Known Allergies Family History Heart Disease - Mother,Father, Hypertension - Mother,Father, Stroke - Father, No family history of Cancer, Diabetes, Hereditary Spherocytosis, Thyroid Problems, Tuberculosis. Social History Current every day smoker, Marital Status - Single, Alcohol Use - Never, Drug Use - No History, Caffeine Use - Daily. Medical History Hospitalization/Surgery History - 05/17/2012, [facility], CVA. - 03/17/2017, [facility], Pneumonia. Medical And Surgical History Notes Ear/Nose/Mouth/Throat difficulty speaking Respiratory Hospitalized for Pneumonia 02/2017 Neurologic CVA in 2014 Review of Systems (ROS) Eyes Denies complaints or symptoms of Dry Eyes, Vision Changes, Glasses / Contacts. Ear/Nose/Mouth/Throat Denies complaints or symptoms of Difficult clearing ears, Sinusitis. Hematologic/Lymphatic Denies complaints or symptoms of Bleeding / Clotting Disorders, Human Immunodeficiency Virus. Respiratory Denies complaints or symptoms of Chronic or frequent coughs, Shortness of Breath. Cardiovascular Denies complaints or symptoms of Chest pain, LE edema. Gastrointestinal Denies complaints or symptoms of Frequent diarrhea, Nausea, Vomiting. Endocrine Denies complaints or symptoms of Hepatitis, Thyroid disease, Polydypsia (Excessive Thirst). Genitourinary Denies complaints or symptoms of Kidney failure/ Dialysis, Incontinence/dribbling. Immunological Denies complaints or symptoms of Hives, Itching. Integumentary (Skin) Denies complaints or symptoms of Wounds, Bleeding or bruising tendency, Breakdown, Swelling. Musculoskeletal Denies complaints or symptoms of Muscle Pain, Muscle Weakness. Neurologic Denies complaints or symptoms of Numbness/parasthesias,  Focal/Weakness. Psychiatric Carrier, AKIN ([id number]) Denies complaints or symptoms of Anxiety, Claustrophobia. Objective Constitutional patient is hypertensive.. pulse regular and within target range for patient.Marland [name] respirations regular, non-labored and within target range for patient.Marland [name] temperature within target range for patient.. Well-nourished and well-hydrated in no acute distress. Vitals Time Taken: 8:14 AM, Height: 74 in, Source: Measured, Weight: 236 lbs, Source: Measured, BMI: 30.3, Temperature: 97.7 F, Pulse: 51 bpm, Respiratory Rate: 18 breaths/min, Blood Pressure: 150/85 mmHg. Eyes conjunctiva clear no eyelid edema noted. pupils equal round and reactive to  light and accommodation. Ears, Nose, Mouth, and Throat no gross abnormality of ear auricles or external auditory canals. normal hearing noted during conversation. mucus membranes moist. Respiratory normal breathing without difficulty. clear to auscultation bilaterally. Cardiovascular regular rate and rhythm with normal S1, S2. 2+ dorsalis pedis/posterior tibialis pulses. 2+ pitting edema of the bilateral lower extremities. Gastrointestinal (GI) soft, non-tender, non-distended, +BS. no ventral hernia noted. Musculoskeletal normal gait and posture. no significant deformity or arthritic changes, no loss or range of motion, no clubbing. Psychiatric this patient is able to make decisions and demonstrates good insight into disease process. Alert and Oriented x 3. pleasant and cooperative. General Notes: Patient's wound was scattered but did require sharp debridement today which he tolerated without complication. He did have some mild discomfort but nothing significant. Post debridement the wound bed areas actually appear to be doing much better. Fortunately he showing no signs of infection at this point which is also good news. The most inferior wound actually was larger and deeper than any of the others unfortunately.  However he did clean up nicely and in the end seems to be doing fairly well. Integumentary (Hair, Skin) Wound #8 status is Open. Original cause of wound was Gradually Appeared. The wound is located on the Right,Proximal,Medial Lower Leg. The wound measures 0.7cm length x 0.3cm width x 0.1cm depth; 0.165cm^2 area and 0.016cm^3 volume. There is no tunneling or undermining noted. There is a medium amount of serous drainage noted. The wound margin is flat and intact. There is medium (34-66%) pink granulation within the wound bed. There is a medium (34- 66%) amount of necrotic tissue within the wound bed including Adherent Slough. The periwound skin appearance did not exhibit: Callus, Crepitus, Excoriation, Induration, Rash, Scarring, Dry/Scaly, Maceration, Atrophie Blanche, Cyanosis, Ecchymosis, Hemosiderin Staining, Mottled, Pallor, Rubor, Erythema. Periwound temperature was noted as No Abnormality. Yordy, [name] ([id number]) The periwound has tenderness on palpation. Wound #9 status is Open. Original cause of wound was Gradually Appeared. The wound is located on the Right,Distal,Medial Lower Leg. The wound measures 0.7cm length x 0.6cm width x 0.2cm depth; 0.33cm^2 area and 0.066cm^3 volume. There is Fat Layer (Subcutaneous Tissue) Exposed exposed. There is no tunneling or undermining noted. There is a medium amount of serous drainage noted. The wound margin is flat and intact. There is small (1-33%) pink granulation within the wound bed. There is a large (67-100%) amount of necrotic tissue within the wound bed including Adherent Slough. The periwound skin appearance did not exhibit: Callus, Crepitus, Excoriation, Induration, Rash, Scarring, Dry/Scaly, Maceration, Atrophie Blanche, Cyanosis, Ecchymosis, Hemosiderin Staining, Mottled, Pallor, Rubor, Erythema. Periwound temperature was noted as No Abnormality. The periwound has tenderness on palpation. 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 There was a Excisional Skin/Subcutaneous Tissue Debridement with a total area of 0.21 sq cm performed by STONE III, HOYT E., PA-C. With the following instrument(s): Curette to remove Viable and Non-Viable tissue/material. Material removed includes Subcutaneous Tissue, Slough, and Fibrin/Exudate after achieving pain control using Lidocaine 4% Topical Solution. No specimens were taken. A time out was conducted at 09:04, prior to the start of the procedure. A Minimum amount of bleeding was controlled with  Pressure. The procedure was tolerated well with a pain level of 0 throughout and a pain level of 0 following the procedure. Patient s Level of Consciousness post procedure was recorded as Awake and Alert. Post Debridement Measurements: 0.7cm length x 0.3cm width x 0.2cm depth; 0.033cm^3 volume. Character of Wound/Ulcer Post Debridement requires further debridement. Severity of Tissue Post Debridement is: Fat layer exposed. Post procedure Diagnosis Wound #8: Same as Pre-Procedure Wound #9 Pre-procedure diagnosis of Wound #9 is a Diabetic Wound/Ulcer of the Lower Extremity located on the Right,Distal,Medial Lower Leg .Severity of Tissue Pre Debridement is: Fat layer exposed. There was a Excisional Skin/Subcutaneous Tissue Debridement with a total area of 0.42 sq cm performed by STONE III, HOYT E., PA-C. With the following instrument(s): Curette to remove Viable and Non-Viable tissue/material. Material removed includes Subcutaneous Tissue, Slough, and Fibrin/Exudate after achieving pain  control using Lidocaine 4% Topical Solution. No specimens were taken. A time out was conducted at 09:04, prior to the start of the procedure. A Minimum amount of bleeding was controlled with Pressure. The procedure was tolerated well with a pain level of 0 throughout and a pain level of 0 following the procedure. Patient s Level of Consciousness post procedure was recorded as Awake and Alert. Post Debridement Measurements: 0.7cm length x 0.6cm width x 0.3cm depth; 0.099cm^3 volume. Character of Wound/Ulcer Post Debridement requires further debridement. Severity of Tissue Post Debridement is: Fat layer exposed. Kuipers, [name] ([id number]) Post procedure Diagnosis Wound #9: Same as Pre-Procedure Plan Wound Cleansing: Wound #8 Right,Proximal,Medial Lower Leg: Clean wound with Normal Saline. Cleanse wound with mild soap and water Wound #9 Right,Distal,Medial Lower Leg: Clean wound with Normal Saline. Cleanse wound with mild soap and water Anesthetic (add to Medication List): Wound #8 Right,Proximal,Medial Lower Leg: Topical Lidocaine 4% cream applied to wound bed prior to debridement (In Clinic Only). Wound #9 Right,Distal,Medial Lower Leg: Topical Lidocaine 4% cream applied to wound bed prior to debridement (In Clinic Only). Skin Barriers/Peri-Wound Care: Wound #8 Right,Proximal,Medial Lower Leg: Moisturizing lotion Wound #9 Right,Distal,Medial Lower Leg: Moisturizing lotion Primary Wound Dressing: Wound #8 Right,Proximal,Medial Lower Leg: Silver Alginate Wound #9 Right,Distal,Medial Lower Leg: Silver Alginate Secondary Dressing: Wound #8 Right,Proximal,Medial Lower Leg: ABD pad Drawtex Wound #9 Right,Distal,Medial Lower Leg: ABD pad Drawtex Dressing Change Frequency: Wound #8 Right,Proximal,Medial Lower Leg: Change dressing every week Wound #9 Right,Distal,Medial Lower Leg: Change dressing every week Follow-up Appointments: Wound #8 Right,Proximal,Medial Lower  Leg: Return Appointment in 1 week. Wound #9 Right,Distal,Medial Lower Leg: Return Appointment in 1 week. Edema Control: Wound #8 Right,Proximal,Medial Lower Leg: 3 Layer Compression System - Right Lower Extremity - unna to anchor Other: - order bilateral compression pumps Wound #9 Right,Distal,Medial Lower Leg: 3 Layer Compression System - Right Lower Extremity - unna to anchor Other: - order bilateral compression pumps Additional Orders / Instructions: Wound #8 Right,Proximal,Medial Lower Leg: Increase protein intake. Wound #9 Right,Distal,Medial Lower Leg: Loeza, Travelle ([id number]) Increase protein intake. Laboratory ordered were: Hb A1c -Method not specified The following medication(s) was prescribed: lidocaine topical 4 % cream 1 1 cream topical was prescribed at facility Currently I'm going to suggest that we go ahead and order the lymphedema pumps for this patient as I think this can be beneficial for him in the long run as far as trying to prevent this from reopening.Unfortunately despite conservative treatments including class I compression stockings the patient still presents with stage one boarding on stage to lymphedema. I explained that even though lymphedema pumps I think will be helpful  that there's no guarantee they will completely take care of everything in regard to the potential for reopening although I think it will definitely lessen it. He understands. We will see what we can do as far as insurance is concerned he may need a trial with the pumps which I think is definitely appropriate and okay as well. Again all in all I'm pleased with the appearance of the wound and I'm hopeful that this will heal quite rapidly as it has in the past. Please see above for specific wound care orders. We will see patient for re-evaluation in 1 week(s) here in the clinic. If anything worsens or changes patient will contact our office for additional recommendations. Electronic  Signature(s) Signed: 10/25/2017 1:00:22 PM By: Worthy Keeler PA-C Entered By: Worthy Keeler on 10/25/2017 12:59:25 [name], [name] ([id number]) -------------------------------------------------------------------------------- ROS/PFSH Details Patient Name: [name] Date of Service: 10/25/2017 8:00 AM Medical Record Number: [id number] Patient Account Number: [id number] Date of Birth/Sex: March 15, 1951 (66 y.o. M) Treating RN: Montey Hora Primary Care Provider: Royetta Crochet Other Clinician: Referring Provider: Royetta Crochet Treating Provider/Extender: STONE III, HOYT Weeks in Treatment: 0 Information Obtained From Patient Wound History Do you currently have one or more open woundso Yes How many open wounds do you currently haveo 1 Approximately how long have you had your woundso 1 week How have you been treating your wound(s) until nowo lotion Has your wound(s) ever healed and then re-openedo Yes Have you had any lab work done in the past montho No Have you tested positive for an antibiotic resistant organism (MRSA, VRE)o No Have you tested positive for osteomyelitis (bone infection)o No Have you had any tests for circulation on your legso Yes Who ordered the testo [facility] Where was the test doneo [facility] Eyes Complaints and Symptoms: Negative for: Dry Eyes; Vision Changes; Glasses / Contacts Medical History: Negative for: Cataracts; Glaucoma; Optic Neuritis Ear/Nose/Mouth/Throat Complaints and Symptoms: Negative for: Difficult clearing ears; Sinusitis Medical History: Negative for: Chronic sinus problems/congestion; Middle ear problems Past Medical History Notes: difficulty speaking Hematologic/Lymphatic Complaints and Symptoms: Negative for: Bleeding / Clotting Disorders; Human Immunodeficiency Virus Medical History: Negative for: Anemia; Hemophilia; Human Immunodeficiency Virus; Lymphedema; Sickle Cell Disease Respiratory Complaints and  Symptoms: Negative for: Chronic or frequent coughs; Shortness of Breath Medical History: Positive for: Chronic Obstructive Pulmonary Disease (COPD) Negative for: Aspiration; Asthma; Pneumothorax; Sleep Apnea; Tuberculosis Faught, Jayanth ([id number]) Past Medical History Notes: Hospitalized for Pneumonia 02/2017 Cardiovascular Complaints and Symptoms: Negative for: Chest pain; LE edema Medical History: Positive for: Arrhythmia - a fib; Deep Vein Thrombosis; Hypertension Negative for: Angina; Congestive Heart Failure; Coronary Artery Disease; Hypotension; Myocardial Infarction; Peripheral Arterial Disease; Peripheral Venous Disease; Phlebitis; Vasculitis Gastrointestinal Complaints and Symptoms: Negative for: Frequent diarrhea; Nausea; Vomiting Medical History: Negative for: Cirrhosis ; Colitis; Crohnos; Hepatitis A; Hepatitis B; Hepatitis C Endocrine Complaints and Symptoms: Negative for: Hepatitis; Thyroid disease; Polydypsia (Excessive Thirst) Medical History: Positive for: Type II Diabetes Negative for: Type I Diabetes Time with diabetes: since 2017 Treated with: Diet Blood sugar tested every day: No Genitourinary Complaints and Symptoms: Negative for: Kidney failure/ Dialysis; Incontinence/dribbling Medical History: Negative for: End Stage Renal Disease Immunological Complaints and Symptoms: Negative for: Hives; Itching Medical History: Negative for: Lupus Erythematosus; Raynaudos; Scleroderma Integumentary (Skin) Complaints and Symptoms: Negative for: Wounds; Bleeding or bruising tendency; Breakdown; Swelling Medical History: Negative for: History of Burn; History of pressure wounds Musculoskeletal Arlotta, Zayn ([id number]) Complaints and Symptoms: Negative for: Muscle Pain; Muscle Weakness Medical History: Positive for:  Gout Negative for: Rheumatoid Arthritis; Osteoarthritis; Osteomyelitis Neurologic Complaints and Symptoms: Negative for:  Numbness/parasthesias; Focal/Weakness Medical History: Positive for: Neuropathy Negative for: Dementia; Quadriplegia; Paraplegia; Seizure Disorder Past Medical History Notes: CVA in 2014 Psychiatric Complaints and Symptoms: Negative for: Anxiety; Claustrophobia Medical History: Negative for: Anorexia/bulimia; Confinement Anxiety Oncologic Medical History: Negative for: Received Chemotherapy; Received Radiation Immunizations Pneumococcal Vaccine: Received Pneumococcal Vaccination: Yes Immunization Notes: up to date Implantable Devices Hospitalization / Surgery History Name of Hospital Purpose of Hospitalization/Surgery Date [facility] CVA 05/17/2012 [place] Pneumonia 03/17/2017 Family and Social History Cancer: No; Diabetes: No; Heart Disease: Yes - Mother,Father; Hereditary Spherocytosis: No; Hypertension: Yes - Mother,Father; Stroke: Yes - Father; Thyroid Problems: No; Tuberculosis: No; Current every day smoker; Marital Status - Single; Alcohol Use: Never; Drug Use: No History; Caffeine Use: Daily; Financial Concerns: No; Food, Clothing or Shelter Needs: No; Support System Lacking: No; Transportation Concerns: No; Advanced Directives: No; Patient does not want information on Advanced Directives Electronic Signature(s) Signed: 10/25/2017 1:00:22 PM By: Worthy Keeler PA-C Signed: 10/25/2017 3:48:56 PM By: Montey Hora Entered By: Montey Hora on 10/25/2017 08:12:10 Brecheisen, [name] ([id number]) -------------------------------------------------------------------------------- SuperBill Details Patient Name: [name] Date of Service: 10/25/2017 Medical Record Number: [id number] Patient Account Number: [id number] Date of Birth/Sex: May 30, 1950 (67 y.o. M) Treating RN: Primary Care Provider: Royetta Crochet Other Clinician: Referring Provider: Royetta Crochet Treating Provider/Extender: Melburn Hake, HOYT Weeks in Treatment: 0 Diagnosis Coding ICD-10 Codes Code  Description I87.2 Venous insufficiency (chronic) (peripheral) E11.622 Type 2 diabetes mellitus with other skin ulcer L97.812 Non-pressure chronic ulcer of other part of right lower leg with fat layer exposed I87.333 Chronic venous hypertension (idiopathic) with ulcer and inflammation of bilateral lower extremity Facility Procedures CPT4 Code Description: [id number] 99213 - WOUND CARE VISIT-LEV 3 EST PT Modifier: Quantity: 1 CPT4 Code Description: [id number] 11042 - DEB SUBQ TISSUE 20 SQ CM/< ICD-10 Diagnosis Description L07.867 Non-pressure chronic ulcer of other part of right lower leg wit Modifier: h fat layer expo Quantity: 1 sed Physician Procedures CPT4: Description Modifier Quantity Code [id number] - WC PHYS LEVEL 4 - EST PT 25 1 ICD-10 Diagnosis Description E11.622 Type 2 diabetes mellitus with other skin ulcer I87.2 Venous insufficiency (chronic) (peripheral) L97.812 Non-pressure chronic  ulcer of other part of right lower leg with fat layer exposed I87.333 Chronic venous hypertension (idiopathic) with ulcer and inflammation of bilateral lower extremity CPT4: [id number] - WC PHYS SUBQ TISS 20 SQ CM 1 ICD-10 Diagnosis Description 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

## 2017-11-01 ENCOUNTER — Encounter: Payer: Medicare Other | Admitting: Physician Assistant

## 2017-11-01 DIAGNOSIS — E11622 Type 2 diabetes mellitus with other skin ulcer: Secondary | ICD-10-CM | POA: Diagnosis not present

## 2017-11-05 NOTE — Progress Notes (Signed)
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 healed in a depressed fashion. To be honest that is not the worst case scenario and I think this may definitely do fine even if it heals in that way. There was no requirement for debridement today as patient appears to be doing very well in regard to his ulcer. His swelling was a little bit more severe than last week but nothing too significant. 07/05/17 on evaluation today patient appears to be doing rather well in regard to his right medial lower extremity ulcer. The wound does appear to be healing although it is healing and somewhat of a cratered fashion the good news is he has excellent epithelium noted. Fortunately there does not appear to be any evidence of infection at this point he is tolerating the dressings as well is the wrap well. 07/12/17 on evaluation today patient appears to be doing very well in regard to his left medial lower extremity ulcer. He has been tolerating the dressing changes without complication. In fact compared to last week this wound which was somewhat concave has fielding dramatically and looks to be doing excellent. I'm extremely happy with the progress. He still has pain rated to be a 4-5/10 but the wound looks great. 07/20/17; the patient's right medial lower extremity ulcer is closed. He has good edema control. Unfortunately he does not really have adequate compression stockings. 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 He has been treated with compression stockings for some time now in fact we have notes having treated him even as far back as 2016 here in  our clinic. Fortunately he has excellent blood flow and tends to heal well. Unfortunately despite wearing his compression stockings, keeping his legs elevated, being on fluid pills (Lasix) and doing everything he can to try to help with fluid he still continues to have issues with this reopening. I think that he may be a good candidate for compression/lymphedema pumps. We have never attempted to get these for him previously. No fevers, chills, nausea, or vomiting noted at this time. Currently patient states his ulcer does hurt although not as bad as some in the past it's also not as large as it has been in the past when it reopened he talked is somewhat early. Obviously this is good news. [name], [name] ([id number]) 11/01/17 on evaluation today patient appears to be doing better in regard to his lower extremity ulcer. He has been tolerating the wraps without complication. Overall things seem to be showing signs of good improvement which is excellent. That's what were looking for obviously. He has always done well with the wraps in the past as well. Fortunately there does not appear to be any evidence of infection at this point in time. Patient History Information obtained from Patient. Family History Heart Disease - Mother,Father, Hypertension - Mother,Father, Stroke - Father, No family history of Cancer, Diabetes, Hereditary Spherocytosis, Thyroid Problems, Tuberculosis. Social History Current every day smoker, Marital Status - Single, Alcohol Use - Never, Drug Use - No History, Caffeine Use - Daily. Medical History Hospitalization/Surgery History - 05/17/2012, [facility], CVA. - 03/17/2017, [facility], Pneumonia. Medical And Surgical History Notes Ear/Nose/Mouth/Throat  difficulty speaking Respiratory Hospitalized for Pneumonia 02/2017 Neurologic CVA in 2014 Review of Systems (ROS) Constitutional Symptoms (General Health) Denies complaints or symptoms of Fever, Chills. Respiratory The patient  has no complaints or symptoms. Cardiovascular The patient has no complaints or symptoms. Psychiatric The patient has no complaints or symptoms. Objective Constitutional Well-nourished and well-hydrated in no acute distress. Vitals Time Taken: 9:19 AM, Height: 74 in, Weight: 236 lbs, BMI: 30.3, Temperature: 97.8 F, Pulse: 51 bpm, Respiratory Rate: 18 breaths/min, Blood Pressure: 152/71 mmHg. Respiratory normal breathing without difficulty. [name], [name] ([id number]) Psychiatric this patient is able to make decisions and demonstrates good insight into disease process. Alert and Oriented x 3. pleasant and cooperative. General Notes: Patient's wound bed actually shows some Slough noted in the middle and inferior portions at this point. This did require sharp debridement and this was cleaned away today without complication. Post debridement the wound bed appears to be doing much better. Integumentary (Hair, Skin) Wound #8 status is Open. Original cause of wound was Gradually Appeared. The wound is located on the Right,Proximal,Medial Lower Leg. The wound measures 0.7cm length x 0.7cm width x 0.1cm depth; 0.385cm^2 area and 0.038cm^3 volume. There is no tunneling or undermining noted. There is a medium amount of serous drainage noted. The wound margin is flat and intact. There is small (1-33%) pink granulation within the wound bed. There is a large (67-100%) amount of necrotic tissue within the wound bed including Adherent Slough. The periwound skin appearance did not exhibit: Callus, Crepitus, Excoriation, Induration, Rash, Scarring, Dry/Scaly, Maceration, Atrophie Blanche, Cyanosis, Ecchymosis, Hemosiderin Staining, Mottled, Pallor, Rubor, Erythema. Periwound temperature was noted as No Abnormality. The periwound has tenderness on palpation. Wound #9 status is Open. Original cause of wound was Gradually Appeared. The wound is located on the Right,Distal,Medial Lower Leg. The wound  measures 2.3cm length x 0.5cm width x 0.2cm depth; 0.903cm^2 area and 0.181cm^3 volume. There is Fat Layer (Subcutaneous Tissue) Exposed exposed. There is no tunneling or undermining noted. There is a medium amount of serous drainage noted. The wound margin is flat and intact. There is small (1-33%) pink granulation within the wound bed. There is a large (67-100%) amount of necrotic tissue within the wound bed including Adherent Slough. The periwound skin appearance did not exhibit: Callus, Crepitus, Excoriation, Induration, Rash, Scarring, Dry/Scaly, Maceration, Atrophie Blanche, Cyanosis, Ecchymosis, Hemosiderin Staining, Mottled, Pallor, Rubor, Erythema. Periwound temperature was noted as No Abnormality. The periwound has tenderness on palpation. 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 There was a Excisional Skin/Subcutaneous Tissue Debridement with a total area of 1.15 sq cm performed by STONE III, HOYT E., PA-C. With the following instrument(s): Curette to remove Viable and Non-Viable tissue/material. Material removed includes Subcutaneous Tissue, Slough, and Fibrin/Exudate after achieving pain control using Lidocaine 4% Topical Solution. No specimens were taken. A time out was conducted at 09:50, prior to the start of the procedure. A Minimum amount of bleeding was controlled with Pressure. The procedure was tolerated well with a pain level of 0 throughout and a pain level of 0 following the procedure. Patient s Level of Consciousness post procedure was recorded as Awake and Alert.  Post Debridement  Measurements: 2.3cm length x 0.5cm width x 0.3cm depth; 0.271cm^3 volume. TAEVON, ASCHOFF ([id number]) Character of Wound/Ulcer Post Debridement requires further debridement. Severity of Tissue Post Debridement is: Fat layer exposed. Post procedure Diagnosis Wound #9: Same as Pre-Procedure Plan Wound Cleansing: Wound #8 Right,Proximal,Medial Lower Leg: Clean wound with Normal Saline. Cleanse wound with mild soap and water Wound #9 Right,Distal,Medial Lower Leg: Clean wound with Normal Saline. Cleanse wound with mild soap and water Anesthetic (add to Medication List): Wound #8 Right,Proximal,Medial Lower Leg: Topical Lidocaine 4% cream applied to wound bed prior to debridement (In Clinic Only). Wound #9 Right,Distal,Medial Lower Leg: Topical Lidocaine 4% cream applied to wound bed prior to debridement (In Clinic Only). Skin Barriers/Peri-Wound Care: Wound #8 Right,Proximal,Medial Lower Leg: Moisturizing lotion Wound #9 Right,Distal,Medial Lower Leg: Moisturizing lotion Primary Wound Dressing: Wound #8 Right,Proximal,Medial Lower Leg: Silver Alginate Wound #9 Right,Distal,Medial Lower Leg: Silver Alginate Secondary Dressing: Wound #8 Right,Proximal,Medial Lower Leg: ABD pad Drawtex Wound #9 Right,Distal,Medial Lower Leg: ABD pad Drawtex Dressing Change Frequency: Wound #8 Right,Proximal,Medial Lower Leg: Change dressing every week Wound #9 Right,Distal,Medial Lower Leg: Change dressing every week Follow-up Appointments: Wound #8 Right,Proximal,Medial Lower Leg: Return Appointment in 1 week. Wound #9 Right,Distal,Medial Lower Leg: Return Appointment in 1 week. Edema Control: Wound #8 Right,Proximal,Medial Lower Leg: 3 Layer Compression System - Right Lower Extremity - unna to anchor Other: - order bilateral compression pumps Wound #9 Right,Distal,Medial Lower Leg: 3 Layer Compression System - Right Lower Extremity - unna to anchor Other: - order  bilateral compression pumps Additional Orders / Instructions: Wound #8 Right,Proximal,Medial Lower Leg: Sakuma, Jeyren ([id number]) Increase protein intake. Wound #9 Right,Distal,Medial Lower Leg: Increase protein intake. The following medication(s) was prescribed: lidocaine topical 4 % cream 1 1 cream topical was prescribed at facility I am going to suggest that we continue with the above wound care orders for the next week. Patient is in agreement with the plan. We will subsequently see were things stand at follow-up. Please see above for specific wound care orders. We will see patient for re-evaluation in 1 week(s) here in the clinic. If anything worsens or changes patient will contact our office for additional recommendations. Electronic Signature(s) Signed: 11/02/2017 7:52:50 AM By: Worthy Keeler PA-C Entered By: Worthy Keeler on 11/01/2017 17:31:24 Iseminger, [name] ([id number]) -------------------------------------------------------------------------------- ROS/PFSH Details Patient Name: [name] Date of Service: 11/01/2017 9:15 AM Medical Record Number: [id number] Patient Account Number: [id number] Date of Birth/Sex: 1950-10-05 (66 y.o. M) Treating RN: Ahmed Prima Primary Care Provider: Royetta Crochet Other Clinician: Referring Provider: Royetta Crochet Treating Provider/Extender: Melburn Hake, HOYT Weeks in Treatment: 1 Information Obtained From Patient Wound History Do you currently have one or more open woundso Yes How many open wounds do you currently haveo 1 Approximately how long have you had your woundso 1 week How have you been treating your wound(s) until nowo lotion Has your wound(s) ever healed and then re-openedo Yes Have you had any lab work done in the past montho No Have you tested positive for an antibiotic resistant organism (MRSA, VRE)o No Have you tested positive for osteomyelitis (bone infection)o No Have you had any tests for  circulation on your legso Yes Who ordered the testo [facility] Where was the test doneo [facility] Constitutional Symptoms (General Health) Complaints and Symptoms: Negative for: Fever; Chills Eyes Medical History: Negative for: Cataracts; Glaucoma; Optic Neuritis Ear/Nose/Mouth/Throat Medical History: Negative for: Chronic sinus problems/congestion; Middle ear problems Past Medical History Notes: difficulty speaking Hematologic/Lymphatic Medical History: Negative for: Anemia;  Hemophilia; Human Immunodeficiency Virus; Lymphedema; Sickle Cell Disease Respiratory Complaints and Symptoms: No Complaints or Symptoms Medical History: Positive for: Chronic Obstructive Pulmonary Disease (COPD) Negative for: Aspiration; Asthma; Pneumothorax; Sleep Apnea; Tuberculosis Past Medical History Notes: Hospitalized for Pneumonia 02/2017 Cardiovascular Baka, ANTHON ([id number]) Complaints and Symptoms: No Complaints or Symptoms Medical History: Positive for: Arrhythmia - a fib; Deep Vein Thrombosis; Hypertension Negative for: Angina; Congestive Heart Failure; Coronary Artery Disease; Hypotension; Myocardial Infarction; Peripheral Arterial Disease; Peripheral Venous Disease; Phlebitis; Vasculitis Gastrointestinal Medical History: Negative for: Cirrhosis ; Colitis; Crohnos; Hepatitis A; Hepatitis B; Hepatitis C Endocrine Medical History: Positive for: Type II Diabetes Negative for: Type I Diabetes Time with diabetes: since 2017 Treated with: Diet Blood sugar tested every day: No Genitourinary Medical History: Negative for: End Stage Renal Disease Immunological Medical History: Negative for: Lupus Erythematosus; Raynaudos; Scleroderma Integumentary (Skin) Medical History: Negative for: History of Burn; History of pressure wounds Musculoskeletal Medical History: Positive for: Gout Negative for: Rheumatoid Arthritis; Osteoarthritis; Osteomyelitis Neurologic Medical History: Positive  for: Neuropathy Negative for: Dementia; Quadriplegia; Paraplegia; Seizure Disorder Past Medical History Notes: CVA in 2014 Oncologic Medical History: Negative for: Received Chemotherapy; Received Radiation Psychiatric DEVERE, BREM ([id number]) Complaints and Symptoms: No Complaints or Symptoms Medical History: Negative for: Anorexia/bulimia; Confinement Anxiety Immunizations Pneumococcal Vaccine: Received Pneumococcal Vaccination: Yes Immunization Notes: up to date Implantable Devices Hospitalization / Surgery History Name of Hospital Purpose of Hospitalization/Surgery Date [facility] CVA 05/17/2012 [facility] Pneumonia 03/17/2017 Family and Social History Cancer: No; Diabetes: No; Heart Disease: Yes - Mother,Father; Hereditary Spherocytosis: No; Hypertension: Yes - Mother,Father; Stroke: Yes - Father; Thyroid Problems: No; Tuberculosis: No; Current every day smoker; Marital Status - Single; Alcohol Use: Never; Drug Use: No History; Caffeine Use: Daily; Financial Concerns: No; Food, Clothing or Shelter Needs: No; Support System Lacking: No; Transportation Concerns: No; Advanced Directives: No; Patient does not want information on Advanced Directives Physician Affirmation I have reviewed and agree with the above information. Electronic Signature(s) Signed: 11/02/2017 7:52:50 AM By: Worthy Keeler PA-C Signed: 11/03/2017 4:29:22 PM By: Alric Quan Entered By: Worthy Keeler on 11/01/2017 17:30:54 Kubly, [name] ([id number]) -------------------------------------------------------------------------------- SuperBill Details Patient Name: [name] Date of Service: 11/01/2017 Medical Record Number: [id number] Patient Account Number: [id number] Date of Birth/Sex: Jan 07, 1951 (66 y.o. M) Treating RN: Ahmed Prima Primary Care Provider: Royetta Crochet Other Clinician: Referring Provider: Royetta Crochet Treating Provider/Extender: Melburn Hake, HOYT Weeks in Treatment:  1 Diagnosis Coding ICD-10 Codes Code Description I87.2 Venous insufficiency (chronic) (peripheral) I89.0 Lymphedema, not elsewhere classified E11.622 Type 2 diabetes mellitus with other skin ulcer L97.812 Non-pressure chronic ulcer of other part of right lower leg with fat layer exposed Facility Procedures CPT4 Code Description: [id number] 11042 - DEB SUBQ TISSUE 20 SQ CM/< ICD-10 Diagnosis Description W73.710 Non-pressure chronic ulcer of other part of right lower leg wit Modifier: h fat layer expo Quantity: 1 sed Physician Procedures CPT4 Code Description: 6269485 11042 - WC PHYS SUBQ TISS 20 SQ CM ICD-10 Diagnosis Description I62.703 Non-pressure chronic ulcer of other part of right lower leg wit Modifier: h fat layer expo Quantity: 1 sed Electronic Signature(s) Signed: 11/02/2017 7:52:50 AM By: Worthy Keeler PA-C Entered By: Worthy Keeler on 11/01/2017 17:31:45

## 2017-11-08 ENCOUNTER — Encounter: Payer: Medicare Other | Admitting: Physician Assistant

## 2017-11-08 DIAGNOSIS — E11622 Type 2 diabetes mellitus with other skin ulcer: Secondary | ICD-10-CM | POA: Diagnosis not present

## 2017-11-10 NOTE — Progress Notes (Signed)
DEKARI, BURES ([id number]) Visit Report for 11/08/2017 Arrival Information Details Patient Name: [name], [name] Date of Service: 11/08/2017 8:00 AM Medical Record Number: [id number] Patient Account Number: [id number] Date of Birth/Sex: 1950-05-27 (66 y.o. M) Treating RN: Roger Shelter Primary Care Ashira Kirsten: Royetta Crochet Other Clinician: Referring Vyla Pint: Royetta Crochet Treating Zoe Creasman/Extender: Melburn Hake, HOYT Weeks in Treatment: 2 Visit Information History Since Last Visit All ordered tests and consults were completed: No Patient Arrived: Ambulatory Added or deleted any medications: No Arrival Time: 08:19 Any new allergies or adverse reactions: No Accompanied By: self Had a fall or experienced change in No Transfer Assistance: None activities of daily living that may affect Patient Identification Verified: Yes risk of falls: Secondary Verification Process Yes Signs or symptoms of abuse/neglect since last visito No Completed: Hospitalized since last visit: No Patient Has Alerts: Yes Implantable device outside of the clinic excluding No Patient Alerts: Patient on Blood cellular tissue based products placed in the center Thinner since last visit: DMII Pain Present Now: No warfarin Electronic Signature(s) Signed: 11/08/2017 4:47:28 PM By: Roger Shelter Entered By: Roger Shelter on 11/08/2017 08:20:06 [name], [name] ([id number]) -------------------------------------------------------------------------------- Encounter Discharge Information Details Patient Name: [name] Date of Service: 11/08/2017 8:00 AM Medical Record Number: [id number] Patient Account Number: [id number] Date of Birth/Sex: July 11, 1950 (66 y.o. M) Treating RN: Roger Shelter Primary Care Makell Drohan: Royetta Crochet Other Clinician: Referring Freyja Govea: Royetta Crochet Treating Dava Rensch/Extender: Melburn Hake, HOYT Weeks in Treatment: 2 Encounter Discharge Information  Items Discharge Condition: Stable Ambulatory Status: Ambulatory Discharge Destination: Home Transportation: Private Auto Schedule Follow-up Appointment: Yes Clinical Summary of Care: Electronic Signature(s) Signed: 11/08/2017 4:47:28 PM By: Roger Shelter Entered By: Roger Shelter on 11/08/2017 08:51:34 Suit, [name] ([id number]) -------------------------------------------------------------------------------- Lower Extremity Assessment Details Patient Name: [name] Date of Service: 11/08/2017 8:00 AM Medical Record Number: [id number] Patient Account Number: [id number] Date of Birth/Sex: 01/27/1951 (66 y.o. M) Treating RN: Roger Shelter Primary Care Leshay Desaulniers: Royetta Crochet Other Clinician: Referring Dearion Huot: Royetta Crochet Treating Spencer Peterkin/Extender: STONE III, HOYT Weeks in Treatment: 2 Edema Assessment Assessed: [Left: No] [Right: No] Edema: [Left: Ye] [Right: s] Calf Left: Right: Point of Measurement: 40 cm From Medial Instep cm 42.7 cm Ankle Left: Right: Point of Measurement: 14 cm From Medial Instep cm 30 cm Vascular Assessment Claudication: Claudication Assessment [Right:None] Pulses: Dorsalis Pedis Palpable: [Right:Yes] Posterior Tibial Extremity colors, hair growth, and conditions: Extremity Color: [Right:Hyperpigmented] Hair Growth on Extremity: [Right:No] Temperature of Extremity: [Right:Warm] Capillary Refill: [Right:< 3 seconds] Toe Nail Assessment Left: Right: Thick: Yes Discolored: Yes Deformed: Yes Improper Length and Hygiene: Yes Electronic Signature(s) Signed: 11/08/2017 4:47:28 PM By: Roger Shelter Entered By: Roger Shelter on 11/08/2017 08:26:42 [name], [name] ([id number]) -------------------------------------------------------------------------------- Multi Wound Chart Details Patient Name: [name] Date of Service: 11/08/2017 8:00 AM Medical Record Number: [id number] Patient Account Number:  [id number] Date of Birth/Sex: 29-Nov-1950 (66 y.o. 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] [9:Diabetic Wound/Ulcer of the Lower Extremity] [N/A:N/A] Secondary Etiology: [8:Lymphedema] [9:Lymphedema] [N/A:N/A] Comorbid History: [8:Chronic Obstructive Pulmonary Disease (COPD), Arrhythmia, Deep Vein Thrombosis, Hypertension, Type II Diabetes, Gout, Neuropathy] [9:Chronic Obstructive Pulmonary  Disease (COPD), Arrhythmia, Deep Vein Thrombosis, Hypertension, Type  II Diabetes, Gout, Neuropathy] [N/A:N/A] Date Acquired: [8:10/17/2017] [9:10/17/2017] [N/A:N/A] Weeks of Treatment: [8:2] [9:2] [N/A:N/A] Wound Status: [8:Open] [9:Open] [N/A:N/A] Measurements L x W x D [8:0.9x0.9x0.2] [9:2.5x0.6x0.2] [N/A:N/A] (cm) Area (cm) : [8:0.636] [9:1.178] [N/A:N/A] Volume (cm) : [8:0.127] [9:0.236] [N/A:N/A] % Reduction in Area: [8:-285.50%] [9:-257.00%] [N/A:N/A] % Reduction in Volume: [8:-693.70%] [9:-257.60%] [N/A:N/A] Classification: [8:Grade 1] [9:Grade 1] [N/A:N/A] Exudate Amount: [8:Medium] [9:Medium] [N/A:N/A] Exudate Type: [8:Serous] [9:Serous] [N/A:N/A] Exudate Color: [8:amber] [9:amber] [N/A:N/A] Wound Margin: [8:Flat and Intact] [9:Flat and Intact] [N/A:N/A] Granulation Amount: [8:Small (1-33%)] [9:Small (1-33%)] [N/A:N/A] Granulation Quality: [8:Pink] [9:Pink] [N/A:N/A] Necrotic Amount: [8:Large  (67-100%)] [9:Large (67-100%)] [N/A:N/A] Exposed Structures: [8:Fat Layer (Subcutaneous Tissue) Exposed: Yes Fascia: No Tendon: No Muscle: No Joint: No Bone: No] [9:Fat Layer (Subcutaneous Tissue) Exposed: Yes Fascia: No Tendon: No Muscle: No Joint: No Bone: No] [N/A:N/A] Epithelialization: Small (1-33%) None N/A Periwound Skin Texture: Excoriation: No Excoriation: No N/A Induration: No Induration: No Callus: No Callus: No Crepitus: No Crepitus: No Rash: No Rash: No Scarring: No Scarring: No Periwound Skin Moisture: Maceration: No Maceration: No N/A Dry/Scaly: No Dry/Scaly: No Periwound Skin Color: Atrophie Blanche: No Atrophie Blanche: No N/A Cyanosis: No Cyanosis: No Ecchymosis: No Ecchymosis: No Erythema: No Erythema: No Hemosiderin Staining: No Hemosiderin Staining: No Mottled: No Mottled: No Pallor: No Pallor: No Rubor: No Rubor: No Temperature: No Abnormality No Abnormality N/A Tenderness on Palpation: Yes Yes N/A Wound Preparation: Ulcer Cleansing: Ulcer Cleansing: N/A Rinsed/Irrigated with Saline, Rinsed/Irrigated with Saline, Other: soap and water Other: soap and water Topical Anesthetic Applied: Topical Anesthetic Applied: Other: lidocaine 4% Other: lidocaine 4% Treatment Notes Electronic Signature(s) Signed: 11/08/2017 5:42:13 PM By: Montey Hora Entered By: Montey Hora on 11/08/2017 08:37:01 [name], [name] ([id number]) -------------------------------------------------------------------------------- Multi-Disciplinary Care Plan Details Patient Name: [name] Date of Service: 11/08/2017 8:00 AM Medical Record Number: [id number] Patient Account Number: [id number] Date of Birth/Sex: 1950-06-13 (66 y.o. M) Treating RN: Montey Hora Primary Care Satin Boal: Royetta Crochet Other Clinician: Referring Saprina Chuong: Royetta Crochet Treating Santoria Chason/Extender: Melburn Hake, HOYT Weeks in Treatment: 2 Active Inactive ` Abuse / Safety / Falls  / Self Care Management Nursing Diagnoses: Potential for falls Goals: Patient will not experience any injury related to falls Date Initiated: 10/25/2017 Target Resolution Date: 02/18/2018 Goal Status: Active Interventions: Assess Activities of Daily Living upon admission and as needed Assess fall risk on admission and as needed Assess: immobility, friction, shearing, incontinence upon admission and as needed Assess impairment of mobility on admission and as needed per policy Assess personal safety and home safety (as indicated) on admission and as needed Assess self care needs on admission and as needed Notes: ` Nutrition Nursing Diagnoses: Imbalanced nutrition Impaired glucose control: actual or potential Potential for alteratiion in Nutrition/Potential for imbalanced nutrition Goals: Patient/caregiver agrees to and verbalizes understanding of need to use nutritional supplements and/or vitamins as prescribed Date Initiated: 10/25/2017 Target Resolution Date: 02/18/2018 Goal Status: Active Patient/caregiver will maintain therapeutic glucose control Date Initiated: 10/25/2017 Target Resolution Date: 02/18/2018 Goal Status: Active Interventions: Assess patient nutrition upon admission and as needed per policy Provide education on elevated blood sugars and impact on wound healing Provide education on nutrition LEA, WALBERT ([id number]) Notes: ` Orientation to the Wound Care Program Nursing Diagnoses: Knowledge deficit related to the wound healing center program Goals: Patient/caregiver will verbalize understanding of the [facility] Date Initiated: 10/25/2017 Target Resolution Date: 11/19/2017 Goal Status: Active Interventions: Provide education on orientation to the wound center Notes: `  Wound/Skin Impairment Nursing Diagnoses: Impaired tissue integrity Knowledge deficit related to ulceration/compromised skin integrity Goals: Ulcer/skin breakdown will  have a volume reduction of 80% by week 12 Date Initiated: 10/25/2017 Target Resolution Date: 02/11/2018 Goal Status: Active Interventions: Assess patient/caregiver ability to perform ulcer/skin care regimen upon admission and as needed Assess ulceration(s) every visit Notes: Electronic Signature(s) Signed: 11/08/2017 5:42:13 PM By: Montey Hora Entered By: Montey Hora on 11/08/2017 08:36:50 [name], [name] ([id number]) -------------------------------------------------------------------------------- Pain Assessment Details Patient Name: [name] Date of Service: 11/08/2017 8:00 AM Medical Record Number: [id number] Patient Account Number: [id number] Date of Birth/Sex: 09-Sep-1950 (66 y.o. M) Treating RN: Roger Shelter Primary Care Alegria Dominique: Royetta Crochet Other Clinician: Referring Clydette Privitera: Royetta Crochet Treating Pleasant Bensinger/Extender: Melburn Hake, HOYT Weeks in Treatment: 2 Active Problems Location of Pain Severity and Description of Pain Patient Has Paino No Site Locations Pain Management and Medication Current Pain Management: Electronic Signature(s) Signed: 11/08/2017 4:47:28 PM By: Roger Shelter Entered By: Roger Shelter on 11/08/2017 08:20:12 [name], [name] ([id number]) -------------------------------------------------------------------------------- Patient/Caregiver Education Details Patient Name: [name] Date of Service: 11/08/2017 8:00 AM Medical Record Number: [id number] Patient Account Number: [id number] Date of Birth/Gender: 01-May-1951 (66 y.o. M) Treating RN: Roger Shelter Primary Care Physician: Royetta Crochet Other Clinician: Referring Physician: Royetta Crochet Treating Physician/Extender: Sharalyn Ink in Treatment: 2 Education Assessment Education Provided To: Patient Education Topics Provided Wound/Skin Impairment: Handouts: Caring for Your Ulcer Methods: Explain/Verbal Responses: State content  correctly Electronic Signature(s) Signed: 11/08/2017 4:47:28 PM By: Roger Shelter Entered By: Roger Shelter on 11/08/2017 08:51:47 [name], [name] ([id number]) -------------------------------------------------------------------------------- Wound Assessment Details Patient Name: [name] Date of Service: 11/08/2017 8:00 AM Medical Record Number: [id number] Patient Account Number: [id number] Date of Birth/Sex: 12/29/1950 (66 y.o. M) Treating RN: Roger Shelter Primary Care Dezra [name]: Royetta Crochet Other Clinician: Referring Angeletta Goelz: Royetta Crochet Treating Babbie Dondlinger/Extender: STONE III, HOYT Weeks in Treatment: 2 Wound Status Wound Number: 8 Primary Diabetic Wound/Ulcer of the Lower Extremity Etiology: Wound Location: Right Lower Leg - Medial, Proximal Secondary Lymphedema Wounding Event: Gradually Appeared Etiology: Date Acquired: 10/17/2017 Wound Open Weeks Of Treatment: 2 Status: Clustered Wound: No Comorbid Chronic Obstructive Pulmonary Disease History: (COPD), Arrhythmia, Deep Vein Thrombosis, Hypertension, Type II Diabetes, Gout, Neuropathy Photos Photo Uploaded By: Montey Hora on 11/08/2017 17:43:16 Wound Measurements Length: (cm) 0.9 Width: (cm) 0.9 Depth: (cm) 0.2 Area: (cm) 0.636 Volume: (cm) 0.127 % Reduction in Area: -285.5% % Reduction in Volume: -693.7% Epithelialization: Small (1-33%) Tunneling: No Undermining: No Wound Description Classification: Grade 1 Wound Margin: Flat and Intact Exudate Amount: Medium Exudate Type: Serous Exudate Color: amber Foul Odor After Cleansing: No Slough/Fibrino Yes Wound Bed Granulation Amount: Small (1-33%) Exposed Structure Granulation Quality: Pink Fascia Exposed: No Necrotic Amount: Large (67-100%) Fat Layer (Subcutaneous Tissue) Exposed: Yes Necrotic Quality: Adherent Slough Tendon Exposed: No Muscle Exposed: No Joint Exposed: No [name], [name] ([id number]) Bone Exposed:  No Periwound Skin Texture Texture Color No Abnormalities Noted: No No Abnormalities Noted: No Callus: No Atrophie Blanche: No Crepitus: No Cyanosis: No Excoriation: No Ecchymosis: No Induration: No Erythema: No Rash: No Hemosiderin Staining: No Scarring: No Mottled: No Pallor: No Moisture Rubor: No No Abnormalities Noted: No Dry / Scaly: No Temperature / Pain Maceration: No Temperature: No Abnormality Tenderness on Palpation: Yes Wound Preparation Ulcer Cleansing: Rinsed/Irrigated with Saline, Other: soap and water, Topical Anesthetic Applied: Other: lidocaine 4%, Treatment Notes Wound #8 (Right, Proximal, Medial Lower Leg) 1. Cleansed with: Clean wound with Normal Saline 2. Anesthetic Topical Lidocaine 4% cream to wound bed prior to debridement  3. Peri-wound Care: Moisturizing lotion 4. Dressing Applied: Prisma Ag 7. Secured with 3 Layer Compression System - Right Lower Extremity Notes xtrasorb Electronic Signature(s) Signed: 11/08/2017 4:47:28 PM By: Roger Shelter Entered By: Roger Shelter on 11/08/2017 08:30:38 [name], [name] ([id number]) -------------------------------------------------------------------------------- Wound Assessment Details Patient Name: [name] Date of Service: 11/08/2017 8:00 AM Medical Record Number: [id number] Patient Account Number: [id number] Date of Birth/Sex: 1950-11-27 (66 y.o. M) Treating RN: Roger Shelter Primary Care Ranard Harte: Royetta Crochet Other Clinician: Referring Robbert Langlinais: Royetta Crochet Treating Monica Zahler/Extender: STONE III, HOYT Weeks in Treatment: 2 Wound Status Wound Number: 9 Primary Diabetic Wound/Ulcer of the Lower Extremity Etiology: Wound Location: Right Lower Leg - Medial, Distal Secondary Lymphedema Wounding Event: Gradually Appeared Etiology: Date Acquired: 10/17/2017 Wound Open Weeks Of Treatment: 2 Status: Clustered Wound: No Comorbid Chronic Obstructive Pulmonary  Disease History: (COPD), Arrhythmia, Deep Vein Thrombosis, Hypertension, Type II Diabetes, Gout, Neuropathy Photos Photo Uploaded By: Montey Hora on 11/08/2017 17:43:17 Wound Measurements Length: (cm) 2.5 Width: (cm) 0.6 Depth: (cm) 0.2 Area: (cm) 1.178 Volume: (cm) 0.236 % Reduction in Area: -257% % Reduction in Volume: -257.6% Epithelialization: None Tunneling: No Undermining: No Wound Description Classification: Grade 1 Wound Margin: Flat and Intact Exudate Amount: Medium Exudate Type: Serous Exudate Color: amber Foul Odor After Cleansing: No Slough/Fibrino Yes Wound Bed Granulation Amount: Small (1-33%) Exposed Structure Granulation Quality: Pink Fascia Exposed: No Necrotic Amount: Large (67-100%) Fat Layer (Subcutaneous Tissue) Exposed: Yes Necrotic Quality: Adherent Slough Tendon Exposed: No Muscle Exposed: No Joint Exposed: No [name], [name] ([id number]) Bone Exposed: No Periwound Skin Texture Texture Color No Abnormalities Noted: No No Abnormalities Noted: No Callus: No Atrophie Blanche: No Crepitus: No Cyanosis: No Excoriation: No Ecchymosis: No Induration: No Erythema: No Rash: No Hemosiderin Staining: No Scarring: No Mottled: No Pallor: No Moisture Rubor: No No Abnormalities Noted: No Dry / Scaly: No Temperature / Pain Maceration: No Temperature: No Abnormality Tenderness on Palpation: Yes Wound Preparation Ulcer Cleansing: Rinsed/Irrigated with Saline, Other: soap and water, Topical Anesthetic Applied: Other: lidocaine 4%, Treatment Notes Wound #9 (Right, Distal, Medial Lower Leg) 1. Cleansed with: Clean wound with Normal Saline 2. Anesthetic Topical Lidocaine 4% cream to wound bed prior to debridement 3. Peri-wound Care: Moisturizing lotion 4. Dressing Applied: Prisma Ag 7. Secured with 3 Layer Compression System - Right Lower Extremity Notes xtrasorb Electronic Signature(s) Signed: 11/08/2017 4:47:28 PM By:  Roger Shelter Entered By: Roger Shelter on 11/08/2017 08:30:55 [name], [name] ([id number]) -------------------------------------------------------------------------------- Vitals Details Patient Name: [name] Date of Service: 11/08/2017 8:00 AM Medical Record Number: [id number] Patient Account Number: [id number] Date of Birth/Sex: January 14, 1951 (66 y.o. M) Treating RN: Roger Shelter Primary Care Connelly [place]: Royetta Crochet Other Clinician: Referring Cora Stetson: Royetta Crochet Treating Zaydah Nawabi/Extender: STONE III, HOYT Weeks in Treatment: 2 Vital Signs Time Taken: 08:20 Temperature (F): 97.8 Height (in): 74 Pulse (bpm): 68 Weight (lbs): 236 Respiratory Rate (breaths/min): 18 Body Mass Index (BMI): 30.3 Blood Pressure (mmHg): 174/98 Reference Range: 80 - 120 mg / dl Electronic Signature(s) Signed: 11/08/2017 4:47:28 PM By: Roger Shelter Entered By: Roger Shelter on 11/08/2017 08:24:29

## 2017-11-10 NOTE — Progress Notes (Signed)
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 with this With regards to his diabetes he is not currently on any treatment. He does describe pain in his leg which forces him to stop although I couldn't really  quantify this. His last arterial studies  were on 07/05/16 which showed triphasic waveforms on the right to the [name] of the popliteal artery. The right posterior tibial and anterior tibial artery were not visualized on this study. I don't actually see ABIs or TBIs. 05/04/17; the patient has small wounds on the right medial heel and the right medial leg. The area on the foot is just about closed. The area on the right medial leg is improved. He is tolerating the 3 layer compression without any complaints. The patient has both known PAD and chronic venous reflux [see discussion above]. 05/11/17; the areas on the patient's medial right heel with healed he still has an open area on the right medial leg. This does not require debridement today I think I did read it last week. We are using silver collagen under 3 layer compression. 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 He has been tolerating the dressing changes without complication. With that being said he does have some epithelialization growing into the wound bed and it appears this wound may be trying to healed in a depressed fashion. To be honest that is not the worst case scenario and I think this may definitely do fine even if it heals in that way. There was no requirement for debridement today as patient appears to be doing very well in regard to his ulcer. His swelling was a little bit more severe than last week but nothing too significant. 07/05/17 on evaluation today patient appears to be doing rather well in regard to his right medial lower extremity ulcer. The wound does appear to be healing although it is healing and somewhat of a cratered fashion the good news is he has excellent epithelium noted. Fortunately there does not appear to be any evidence of infection at this  point he is tolerating the dressings as well is the wrap well. 07/12/17 on evaluation today patient appears to be doing very well in regard to his left medial lower extremity ulcer. He has been tolerating the dressing changes without complication. In fact compared to last week this wound which was somewhat concave has fielding dramatically and looks to be doing excellent. I'm extremely happy with the progress. He still has pain rated to be a 4-5/10 but the wound looks great. 07/20/17; the patient's right medial lower extremity ulcer is closed. He has good edema control. Unfortunately he does not really have adequate compression stockings. 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 He has been treated with compression stockings for some time now in fact we have notes having treated him even as far back as 2016 here in our clinic. Fortunately he has excellent blood flow and tends to heal well. Unfortunately despite wearing his compression stockings, keeping his legs elevated, being on fluid pills (Lasix) and doing everything he can to try to help with fluid he still continues to have issues with this reopening. I think that he may be a good candidate for compression/lymphedema pumps. We have never attempted to get these for him previously. No fevers, chills, nausea, or vomiting noted  at this time. Currently patient states his ulcer does hurt although not as bad as some in the past it's also not as large as it has been in the past when it reopened he talked is somewhat early. Obviously this is good news. GILES, CURRIE ([id number]) 11/01/17 on evaluation today patient appears to be  doing better in regard to his lower extremity ulcer. He has been tolerating the wraps without complication. Overall things seem to be showing signs of good improvement which is excellent. That's what were looking for obviously. He has always done well with the wraps in the past as well. Fortunately there does not appear to be any evidence of infection at this point in time. 11/08/17 on evaluation today patient appears to be doing rather okay in regard to his right medial malleolus ulcer. With that being said he still seems to have some fluid control issues at this point. With that being said I think we may want to switch from the silvercell to maybe Prisma to try to help this and prove more significantly as far as the moisture control is concerned. Overall I do think she's made some progress and I believe that we may be able to aid him along with just a few minor changes. His swelling seems to be doing well with the wrap. Patient History Information obtained from Patient. Family History Heart Disease - Mother,Father, Hypertension - Mother,Father, Stroke - Father, No family history of Cancer, Diabetes, Hereditary Spherocytosis, Thyroid Problems, Tuberculosis. Social History Current every day smoker, Marital Status - Single, Alcohol Use - Never, Drug Use - No History, Caffeine Use - Daily. Medical History Hospitalization/Surgery History - 05/17/2012, [facility], CVA. - 03/17/2017, [facility], Pneumonia. Medical And Surgical History Notes Ear/Nose/Mouth/Throat difficulty speaking Respiratory Hospitalized for Pneumonia 02/2017 Neurologic CVA in 2014 Review of Systems (ROS) Constitutional Symptoms (General Health) Denies complaints or symptoms of Fever, Chills. Respiratory The patient has no complaints or symptoms. Cardiovascular Complains or has symptoms of LE edema. Psychiatric The patient has no complaints or symptoms. Objective Constitutional Well-nourished and well-hydrated in no acute  distress. Vitals Time Taken: 8:20 AM, Height: 74 in, Weight: 236 lbs, BMI: 30.3, Temperature: 97.8 F, Pulse: 68 bpm, Respiratory Kocourek, Toni ([id number]) Rate: 18 breaths/min, Blood Pressure: 174/98 mmHg. Respiratory normal breathing without difficulty. Psychiatric this patient is able to make decisions and demonstrates good insight into disease process. Alert and Oriented x 3. pleasant and cooperative. General Notes: On evaluation today I did perform sharp debridement of patient's wounds on the right medial malleolus region which he tolerated well with some discomfort post debridement the wound beds appear to be doing much better. I was able to remove the majority of the slough from the surface of the wound. Fortunately there does not appear to be any evidence of infection. Integumentary (Hair, Skin) Wound #8 status is Open. Original cause of wound was Gradually Appeared. The wound is located on the Right,Proximal,Medial Lower Leg. The wound measures 0.9cm length x 0.9cm width x 0.2cm depth; 0.636cm^2 area and 0.127cm^3 volume. There is Fat Layer (Subcutaneous Tissue) Exposed exposed. There is no tunneling or undermining noted. There is a medium amount of serous drainage noted. The wound margin is flat and intact. There is small (1-33%) pink granulation within the wound bed. There is a large (67-100%) amount of necrotic tissue within the wound bed including Adherent Slough. The periwound skin appearance did not exhibit: Callus, Crepitus, Excoriation, Induration, Rash, Scarring, Dry/Scaly, Maceration, Atrophie Blanche, Cyanosis, Ecchymosis, Hemosiderin Staining, Mottled, Pallor, Rubor, Erythema.  Periwound temperature was noted as No Abnormality. The periwound has tenderness on palpation. Wound #9 status is Open. Original cause of wound was Gradually Appeared. The wound is located on the Right,Distal,Medial Lower Leg. The wound measures 2.5cm length x 0.6cm width x 0.2cm depth;  1.178cm^2 area and 0.236cm^3 volume. There is Fat Layer (Subcutaneous Tissue) Exposed exposed. There is no tunneling or undermining noted. There is a medium amount of serous drainage noted. The wound margin is flat and intact. There is small (1-33%) pink granulation within the wound bed. There is a large (67-100%) amount of necrotic tissue within the wound bed including Adherent Slough. The periwound skin appearance did not exhibit: Callus, Crepitus, Excoriation, Induration, Rash, Scarring, Dry/Scaly, Maceration, Atrophie Blanche, Cyanosis, Ecchymosis, Hemosiderin Staining, Mottled, Pallor, Rubor, Erythema. Periwound temperature was noted as No Abnormality. The periwound has tenderness on palpation. 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 There was a Excisional Skin/Subcutaneous Tissue Rosamilia, Avyan ([id number]) Debridement with a total area of 0.81 sq cm performed by STONE III, Branndon Tuite E., PA-C. With the following instrument(s): Curette to remove Viable and Non-Viable tissue/material. Material removed includes Subcutaneous Tissue and Slough and after achieving pain control using Lidocaine 4% Topical Solution. No specimens were taken. A time out was conducted at 08:36, prior to the start of the procedure. A Minimum amount of bleeding was controlled with Pressure. The procedure was tolerated well with a pain [name] of 0 throughout and a pain [name] of 0 following the procedure. Patient s [name] of Consciousness post procedure was recorded as Awake and Alert. Post Debridement  Measurements: 0.9cm length x 0.9cm width x 0.3cm depth; 0.191cm^3 volume. Character of Wound/Ulcer Post Debridement is improved. Severity of Tissue Post Debridement is: Fat layer exposed. Post procedure Diagnosis Wound #8: Same as Pre-Procedure Wound #9 Pre-procedure diagnosis of Wound #9 is a Diabetic Wound/Ulcer of the Lower Extremity located on the Right,Distal,Medial Lower Leg .Severity of Tissue Pre Debridement is: Fat layer exposed. There was a Excisional Skin/Subcutaneous Tissue Debridement with a total area of 1.5 sq cm performed by STONE III, Zaelynn Fuchs E., PA-C. With the following instrument(s): Curette to remove Viable and Non-Viable tissue/material. Material removed includes Subcutaneous Tissue and Slough and after achieving pain control using Lidocaine 4% Topical Solution. No specimens were taken. A time out was conducted at 08:38, prior to the start of the procedure. A Minimum amount of bleeding was controlled with Pressure. The procedure was tolerated well with a pain [name] of 0 throughout and a pain [name] of 0 following the procedure. Patient s [name] of Consciousness post procedure was recorded as Awake and Alert. Post Debridement Measurements: 2.5cm length x 0.6cm width x 0.3cm depth; 0.353cm^3 volume. Character of Wound/Ulcer Post Debridement is improved. Severity of Tissue Post Debridement is: Fat layer exposed. Post procedure Diagnosis Wound #9: Same as Pre-Procedure Plan Wound Cleansing: Wound #8 Right,Proximal,Medial Lower Leg: Clean wound with Normal Saline. Cleanse wound with mild soap and water Wound #9 Right,Distal,Medial Lower Leg: Clean wound with Normal Saline. Cleanse wound with mild soap and water Anesthetic (add to Medication List): Wound #8 Right,Proximal,Medial Lower Leg: Topical Lidocaine 4% cream applied to wound bed prior to debridement (In  Clinic Only). Wound #9 Right,Distal,Medial Lower Leg: Topical Lidocaine 4% cream applied to wound bed prior to  debridement (In Clinic Only). Skin Barriers/Peri-Wound Care: Wound #8 Right,Proximal,Medial Lower Leg: Moisturizing lotion Wound #9 Right,Distal,Medial Lower Leg: Moisturizing lotion Primary Wound Dressing: Wound #8 Right,Proximal,Medial Lower Leg: Silver Collagen Wound #9 Right,Distal,Medial Lower Leg: Silver Collagen Secondary Dressing: Wound #8 Right,Proximal,Medial Lower Leg: XtraSorb Wound #9 Right,Distal,Medial Lower Leg: XtraSorb Dressing Change Frequency: Bossard, Myrl ([id number]) Wound #8 Right,Proximal,Medial Lower Leg: Change dressing every week Wound #9 Right,Distal,Medial Lower Leg: Change dressing every week Follow-up Appointments: Wound #8 Right,Proximal,Medial Lower Leg: Return Appointment in 1 week. Wound #9 Right,Distal,Medial Lower Leg: Return Appointment in 1 week. Edema Control: Wound #8 Right,Proximal,Medial Lower Leg: 3 Layer Compression System - Right Lower Extremity - unna to anchor Other: - order bilateral compression pumps Wound #9 Right,Distal,Medial Lower Leg: 3 Layer Compression System - Right Lower Extremity - unna to anchor Other: - order bilateral compression pumps Additional Orders / Instructions: Wound #8 Right,Proximal,Medial Lower Leg: Increase protein intake. Wound #9 Right,Distal,Medial Lower Leg: Increase protein intake. At this point I'm gonna recommend that we go ahead and switch to Prisma to see if we can stimulate additional growth and granulation. We will cover this with an extra sorb in order to help again with fluid management and see if this will better control things. Patient is in agreement the plan. Will subtly see were things stand at follow-up. Please see above for specific wound care orders. We will see patient for re-evaluation in 1 week(s) here in the clinic. If anything worsens or changes patient will contact our office for additional recommendations. Electronic Signature(s) Signed: 11/09/2017 12:21:38 AM By:  Worthy Keeler PA-C Entered By: Worthy Keeler on 11/08/2017 08:43:40 [place], [name] ([id number]) -------------------------------------------------------------------------------- ROS/PFSH Details Patient Name: [name] Date of Service: 11/08/2017 8:00 AM Medical Record Number: [id number] Patient Account Number: [id number] Date of Birth/Sex: 1951/03/03 (66 y.o. M) Treating RN: Ahmed Prima Primary Care Provider: Royetta Crochet Other Clinician: Referring Provider: Royetta Crochet Treating Provider/Extender: Melburn Hake, Aramis Zobel Weeks in Treatment: 2 Information Obtained From Patient Wound History Do you currently have one or more open woundso Yes How many open wounds do you currently haveo 1 Approximately how long have you had your woundso 1 week How have you been treating your wound(s) until nowo lotion Has your wound(s) ever healed and then re-openedo Yes Have you had any lab work done in the past montho No Have you tested positive for an antibiotic resistant organism (MRSA, VRE)o No Have you tested positive for osteomyelitis (bone infection)o No Have you had any tests for circulation on your legso Yes Who ordered the testo [facility] Where was the test doneo [facility] Constitutional Symptoms (General Health) Complaints and Symptoms: Negative for: Fever; Chills Cardiovascular Complaints and Symptoms: Positive for: LE edema Medical History: Positive for: Arrhythmia - a fib; Deep Vein Thrombosis; Hypertension Negative for: Angina; Congestive Heart Failure; Coronary Artery Disease; Hypotension; Myocardial Infarction; Peripheral Arterial Disease; Peripheral Venous Disease; Phlebitis; Vasculitis Eyes Medical History: Negative for: Cataracts; Glaucoma; Optic Neuritis Ear/Nose/Mouth/Throat Medical History: Negative for: Chronic sinus problems/congestion; Middle ear problems Past Medical History Notes: difficulty speaking Hematologic/Lymphatic Medical History: Negative  for: Anemia; Hemophilia; Human Immunodeficiency Virus; Lymphedema; Sickle Cell Disease Respiratory Berg, Srikar ([id number]) Complaints and Symptoms: No Complaints or Symptoms Medical History: Positive for: Chronic Obstructive Pulmonary Disease (COPD) Negative for: Aspiration; Asthma; Pneumothorax; Sleep Apnea; Tuberculosis Past Medical History Notes: Hospitalized for Pneumonia 02/2017 Gastrointestinal Medical History: Negative for: Cirrhosis ;  Colitis; Crohnos; Hepatitis A; Hepatitis B; Hepatitis C Endocrine Medical History: Positive for: Type II Diabetes Negative for: Type I Diabetes Time with diabetes: since 2017 Treated with: Diet Blood sugar tested every day: No Genitourinary Medical History: Negative for: End Stage Renal Disease Immunological Medical History: Negative for: Lupus Erythematosus; Raynaudos; Scleroderma Integumentary (Skin) Medical History: Negative for: History of Burn; History of pressure wounds Musculoskeletal Medical History: Positive for: Gout Negative for: Rheumatoid Arthritis; Osteoarthritis; Osteomyelitis Neurologic Medical History: Positive for: Neuropathy Negative for: Dementia; Quadriplegia; Paraplegia; Seizure Disorder Past Medical History Notes: CVA in 2014 Oncologic Medical History: Negative for: Received Chemotherapy; Received Radiation Psychiatric LAVONTA, TILLIS ([id number]) Complaints and Symptoms: No Complaints or Symptoms Medical History: Negative for: Anorexia/bulimia; Confinement Anxiety Immunizations Pneumococcal Vaccine: Received Pneumococcal Vaccination: Yes Immunization Notes: up to date Implantable Devices Hospitalization / Surgery History Name of Hospital Purpose of Hospitalization/Surgery Date [facility] CVA 05/17/2012 [facility] Pneumonia 03/17/2017 Family and Social History Cancer: No; Diabetes: No; Heart Disease: Yes - Mother,Father; Hereditary Spherocytosis: No; Hypertension: Yes - Mother,Father; Stroke: Yes -  Father; Thyroid Problems: No; Tuberculosis: No; Current every day smoker; Marital Status - Single; Alcohol Use: Never; Drug Use: No History; Caffeine Use: Daily; Financial Concerns: No; Food, Clothing or Shelter Needs: No; Support System Lacking: No; Transportation Concerns: No; Advanced Directives: No; Patient does not want information on Advanced Directives Physician Affirmation I have reviewed and agree with the above information. Electronic Signature(s) Signed: 11/09/2017 12:21:38 AM By: Worthy Keeler PA-C Signed: 11/09/2017 4:02:05 PM By: Alric Quan Entered By: Worthy Keeler on 11/08/2017 08:42:45 Frenkel, [name] ([id number]) -------------------------------------------------------------------------------- SuperBill Details Patient Name: [name] Date of Service: 11/08/2017 Medical Record Number: [id number] Patient Account Number: [id number] Date of Birth/Sex: [birth year] (66 y.o. M) Treating RN: Ahmed Prima Primary Care Provider: Royetta Crochet Other Clinician: Referring Provider: Royetta Crochet Treating Provider/Extender: Melburn Hake, Laela Deviney Weeks in Treatment: 2 Diagnosis Coding ICD-10 Codes Code Description I87.2 Venous insufficiency (chronic) (peripheral) I89.0 Lymphedema, not elsewhere classified E11.622 Type 2 diabetes mellitus with other skin ulcer L97.812 Non-pressure chronic ulcer of other part of right lower leg with fat layer exposed Facility Procedures CPT4 Code Description: 07867544 11042 - DEB SUBQ TISSUE 20 SQ CM/< ICD-10 Diagnosis Description B20.100 Non-pressure chronic ulcer of other part of right lower leg wit Modifier: h fat layer expo Quantity: 1 sed Physician Procedures CPT4 Code Description: 7121975 11042 - WC PHYS SUBQ TISS 20 SQ CM ICD-10 Diagnosis Description O83.254 Non-pressure chronic ulcer of other part of right lower leg wit Modifier: h fat layer expo Quantity: 1 sed Electronic Signature(s) Signed: 11/09/2017 12:21:38 AM By:  Worthy Keeler PA-C Entered By: Worthy Keeler on 11/08/2017 08:44:00

## 2017-11-15 ENCOUNTER — Encounter: Payer: Medicare Other | Attending: Physician Assistant | Admitting: Physician Assistant

## 2017-11-15 DIAGNOSIS — Z87891 Personal history of nicotine dependence: Secondary | ICD-10-CM | POA: Insufficient documentation

## 2017-11-15 DIAGNOSIS — M109 Gout, unspecified: Secondary | ICD-10-CM | POA: Insufficient documentation

## 2017-11-15 DIAGNOSIS — Z86711 Personal history of pulmonary embolism: Secondary | ICD-10-CM | POA: Diagnosis not present

## 2017-11-15 DIAGNOSIS — I10 Essential (primary) hypertension: Secondary | ICD-10-CM | POA: Diagnosis not present

## 2017-11-15 DIAGNOSIS — Z79899 Other long term (current) drug therapy: Secondary | ICD-10-CM | POA: Insufficient documentation

## 2017-11-15 DIAGNOSIS — Z86718 Personal history of other venous thrombosis and embolism: Secondary | ICD-10-CM | POA: Insufficient documentation

## 2017-11-15 DIAGNOSIS — E11622 Type 2 diabetes mellitus with other skin ulcer: Secondary | ICD-10-CM | POA: Insufficient documentation

## 2017-11-15 DIAGNOSIS — I890 Lymphedema, not elsewhere classified: Secondary | ICD-10-CM | POA: Diagnosis not present

## 2017-11-15 DIAGNOSIS — Z7901 Long term (current) use of anticoagulants: Secondary | ICD-10-CM | POA: Insufficient documentation

## 2017-11-15 DIAGNOSIS — I4891 Unspecified atrial fibrillation: Secondary | ICD-10-CM | POA: Diagnosis not present

## 2017-11-15 DIAGNOSIS — I872 Venous insufficiency (chronic) (peripheral): Secondary | ICD-10-CM | POA: Insufficient documentation

## 2017-11-15 DIAGNOSIS — L97812 Non-pressure chronic ulcer of other part of right lower leg with fat layer exposed: Secondary | ICD-10-CM | POA: Insufficient documentation

## 2017-11-16 NOTE — Progress Notes (Signed)
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 ulcer. He has been tolerating the dressing changes without complication. With that being said he does have some epithelialization growing into the wound bed and it appears this wound may be trying to healed in a depressed fashion. To be honest that is not the worst case scenario and I think this may definitely do fine even if it heals in that way. There was no requirement for debridement today as patient appears to be doing very well in regard to his ulcer. His swelling was a little bit more severe than last week but nothing too significant. 07/05/17 on evaluation today patient appears to be doing rather well in regard to his right medial lower extremity ulcer. The wound does appear to be healing although it is healing and somewhat of a cratered fashion the good news is he has excellent epithelium noted. Fortunately there does not appear to be any evidence of infection at this point he is tolerating the dressings as well is the wrap well. 07/12/17 on evaluation today patient appears to be doing very well in regard to his left medial lower extremity ulcer. He has been tolerating the dressing changes without complication. In fact compared to last week this wound which was somewhat concave has fielding dramatically and looks to be doing excellent. I'm extremely happy with the progress. He still has pain rated to be a 4-5/10 but the wound looks great. 07/20/17; the patient's right medial lower extremity ulcer is closed. He has good edema control. Unfortunately he does not really have adequate compression stockings. 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 He has been treated with compression stockings for some time now in fact we have notes having treated him even as far back as 2016 here in our clinic. Fortunately he has excellent blood flow and tends to heal well. Unfortunately despite wearing his compression stockings, keeping his legs elevated, being on fluid pills (Lasix) and doing everything he can to try to help with fluid he still continues to have issues with this reopening. I think that he may be a good candidate for compression/lymphedema pumps. We have never attempted to get these for him previously. No fevers, chills, nausea, or vomiting noted at this time. Currently patient states his ulcer does hurt although not as bad as some in the past it's also not as large as it has been in the past when it reopened he talked is somewhat early. Obviously this is good news. [name], [name] ([id number]) 11/01/17 on evaluation today patient appears to be doing better in regard to his lower extremity ulcer. He has been tolerating the wraps without complication. Overall things seem to be showing signs of good improvement which is excellent. That's what were looking for obviously. He has always done well with the wraps in the past as well. Fortunately there does  not appear to be any evidence of infection at this point in time. 11/08/17 on evaluation today patient appears to be doing rather okay in regard to his right medial malleolus ulcer. With that being said he still seems to have some fluid control issues at this point. With that being said I think we may want to switch from the silvercell to maybe Prisma to try to help this and prove more significantly as far as the moisture control is concerned. Overall I do think she's made some progress and I believe that we may be able to aid him along with just a few minor changes. His swelling seems to be doing well with the wrap. 11/15/17 on  evaluation today patient's right medial malleolus ulcer appears to be very moist as far as drainage is concerned. It does appear that the Drawtex was not as beneficial for him as we would've liked. He fortunately has no evidence of infection at this time unfortunately again he did have a lot of maceration noted at this point. His wound was a little bit larger. Otherwise there is no evidence of active infection at this point. Patient History Information obtained from Patient. Family History Heart Disease - Mother,Father, Hypertension - Mother,Father, Stroke - Father, No family history of Cancer, Diabetes, Hereditary Spherocytosis, Thyroid Problems, Tuberculosis. Social History Current every day smoker, Marital Status - Single, Alcohol Use - Never, Drug Use - No History, Caffeine Use - Daily. Medical History Hospitalization/Surgery History - 05/17/2012, [facility], CVA. - 03/17/2017, [facility], Pneumonia. Medical And Surgical History Notes Ear/Nose/Mouth/Throat difficulty speaking Respiratory Hospitalized for Pneumonia 02/2017 Neurologic CVA in 2014 Review of Systems (ROS) Constitutional Symptoms (General Health) Denies complaints or symptoms of Fever, Chills. Respiratory The patient has no complaints or symptoms. Cardiovascular Complains or has symptoms of LE edema. Psychiatric The patient has no complaints or symptoms. Objective [name], [name] ([id number]) Constitutional Well-nourished and well-hydrated in no acute distress. Vitals Time Taken: 8:25 AM, Height: 74 in, Weight: 236 lbs, BMI: 30.3, Temperature: 98.3 F, Pulse: 55 bpm, Respiratory Rate: 18 breaths/min, Blood Pressure: 144/79 mmHg. Respiratory normal breathing without difficulty. Psychiatric this patient is able to make decisions and demonstrates good insight into disease process. Alert and Oriented x 3. pleasant and cooperative. General Notes: Patient's wound bed did have slough noted on the surface of the wound which did  require sharp debridement today. He tolerated this without complication and post debridement the wound bed appears to be doing much better. Overall I do believe that he is making good progress I do think if we can control his drainage little bit better this will improve even faster. Integumentary (Hair, Skin) Wound #8 status is Open. Original cause of wound was Gradually Appeared. The wound is located on the Right,Proximal,Medial Lower Leg. The wound measures 4.5cm length x 2.3cm width x 0.2cm depth; 8.129cm^2 area and 1.626cm^3 volume. There is Fat Layer (Subcutaneous Tissue) Exposed exposed. There is no tunneling or undermining noted. There is a large amount of serous drainage noted. The wound margin is flat and intact. There is medium (34-66%) pink granulation within the wound bed. There is a medium (34-66%) amount of necrotic tissue within the wound bed including Adherent Slough. The periwound skin appearance did not exhibit: Callus, Crepitus, Excoriation, Induration, Rash, Scarring, Dry/Scaly, Maceration, Atrophie Blanche, Cyanosis, Ecchymosis, Hemosiderin Staining, Mottled, Pallor, Rubor, Erythema. Periwound temperature was noted as No Abnormality. The periwound has tenderness on palpation. Wound #9 status is Converted. Original cause of wound was Gradually Appeared. The wound is located  on the Right,Distal,Medial Lower Leg. 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 There was a Excisional Skin/Subcutaneous Tissue Debridement with a total area of 10.35 sq cm performed by Mark  [name], [name] E., PA-C. With the following instrument(s): Ismael, Michaeljohn ([id number]) Curette to remove Viable and Non-Viable tissue/material. Material removed includes Subcutaneous Tissue, Slough, and Fibrin/Exudate after achieving pain control using Lidocaine 4% Topical Solution. No specimens were taken. A time out was conducted at 08:45, prior to the start of the procedure. A Minimum amount of bleeding was controlled with Pressure. The procedure was tolerated well with a pain level of 0 throughout and a pain level of 0 following the procedure. Patient s Level of Consciousness post procedure was recorded as Awake and Alert. Post Debridement Measurements: 4.5cm length x 2.3cm width x 0.3cm depth; 2.439cm^3 volume. Character of Wound/Ulcer Post Debridement requires further debridement. Severity of Tissue Post Debridement is: Fat layer exposed. Post procedure Diagnosis Wound #8: Same as Pre-Procedure Plan Wound Cleansing: Wound #8 Right,Proximal,Medial Lower Leg: Clean wound with Normal Saline. Cleanse wound with mild soap and water Anesthetic (add to Medication List): Wound #8 Right,Proximal,Medial Lower Leg: Topical Lidocaine 4% cream applied to wound bed prior to debridement (In Clinic Only). Skin Barriers/Peri-Wound Care: Wound #8 Right,Proximal,Medial Lower Leg: Moisturizing lotion Primary Wound Dressing: Wound #8 Right,Proximal,Medial Lower Leg: Silver Collagen Secondary Dressing: Wound #8 Right,Proximal,Medial Lower Leg: ABD pad Other - kerramax Dressing Change Frequency: Wound #8 Right,Proximal,Medial Lower Leg: Change dressing every week Follow-up Appointments: Wound #8 Right,Proximal,Medial Lower Leg: Return Appointment in 1 week. Edema Control: Wound #8 Right,Proximal,Medial Lower Leg: 3 Layer Compression System - Right Lower Extremity - unna to anchor Other: - order bilateral compression pumps Additional Orders / Instructions: Wound #8 Right,Proximal,Medial Lower  Leg: Increase protein intake. The following medication(s) was prescribed: lidocaine topical 4 % cream 1 1 cream topical was prescribed at facility I am going to suggest that we go ahead and continue with the Prisma although we will use an alternative dressing overlying this to help with moisture control. Please see above. With that being said I do believe that he can no good improvement given time in fact his heel before very readily and I think we will get there again it's just going to take some time as far as finding the right way to get things under control from a moisture standpoint, controlling his swelling, and ensuring that everything progresses as planned. We will subsequently see were things stand at follow-up. Micallef, [name] ([id number]) Please see above for specific wound care orders. We will see patient for re-evaluation in 1 week(s) here in the clinic. If anything worsens or changes patient will contact our office for additional recommendations. Electronic Signature(s) Signed: 11/16/2017 1:22:53 AM By: Mark Keeler PA-C Entered By: [name] on 11/15/2017 09:09:26 Mcclard, [name] ([id number]) -------------------------------------------------------------------------------- ROS/PFSH Details Patient Name: [name] Date of Service: 11/15/2017 8:30 AM Medical Record Number: [id number] Patient Account Number: [id number] Date of Birth/Sex: 10-12-1950 (66 y.o. M) Treating RN: [name] Primary Care Provider: Royetta [name] Other Clinician: Referring Provider: Royetta [name] Treating Provider/Extender: [name], [name] Weeks in Treatment: 3 Information Obtained From Patient Wound History Do  you currently have one or more open woundso Yes How many open wounds do you currently haveo 1 Approximately how long have you had your woundso 1 week How have you been treating your wound(s) until nowo lotion Has your wound(s) ever healed and then re-openedo  Yes Have you had any lab work done in the past montho No Have you tested positive for an antibiotic resistant organism (MRSA, VRE)o No Have you tested positive for osteomyelitis (bone infection)o No Have you had any tests for circulation on your legso Yes Who ordered the testo [facility] Where was the test doneo [facility] Constitutional Symptoms (General Health) Complaints and Symptoms: Negative for: Fever; Chills Cardiovascular Complaints and Symptoms: Positive for: LE edema Medical History: Positive for: Arrhythmia - a fib; Deep Vein Thrombosis; Hypertension Negative for: Angina; Congestive Heart Failure; Coronary Artery Disease; Hypotension; Myocardial Infarction; Peripheral Arterial Disease; Peripheral Venous Disease; Phlebitis; Vasculitis Eyes Medical History: Negative for: Cataracts; Glaucoma; Optic Neuritis Ear/Nose/Mouth/Throat Medical History: Negative for: Chronic sinus problems/congestion; Middle ear problems Past Medical History Notes: difficulty speaking Hematologic/Lymphatic Medical History: Negative for: Anemia; Hemophilia; Human Immunodeficiency Virus; Lymphedema; Sickle Cell Disease Respiratory Leventhal, Eswin ([id number]) Complaints and Symptoms: No Complaints or Symptoms Medical History: Positive for: Chronic Obstructive Pulmonary Disease (COPD) Negative for: Aspiration; Asthma; Pneumothorax; Sleep Apnea; Tuberculosis Past Medical History Notes: Hospitalized for Pneumonia 02/2017 Gastrointestinal Medical History: Negative for: Cirrhosis ; Colitis; Crohnos; Hepatitis A; Hepatitis B; Hepatitis C Endocrine Medical History: Positive for: Type II Diabetes Negative for: Type I Diabetes Time with diabetes: since 2017 Treated with: Diet Blood sugar tested every day: No Genitourinary Medical History: Negative for: End Stage Renal Disease Immunological Medical History: Negative for: Lupus Erythematosus; Raynaudos; Scleroderma Integumentary (Skin) Medical  History: Negative for: History of Burn; History of pressure wounds Musculoskeletal Medical History: Positive for: Gout Negative for: Rheumatoid Arthritis; Osteoarthritis; Osteomyelitis Neurologic Medical History: Positive for: Neuropathy Negative for: Dementia; Quadriplegia; Paraplegia; Seizure Disorder Past Medical History Notes: CVA in 2014 Oncologic Medical History: Negative for: Received Chemotherapy; Received Radiation Psychiatric AVYN, COATE ([id number]) Complaints and Symptoms: No Complaints or Symptoms Medical History: Negative for: Anorexia/bulimia; Confinement Anxiety Immunizations Pneumococcal Vaccine: Received Pneumococcal Vaccination: Yes Immunization Notes: up to date Implantable Devices Hospitalization / Surgery History Name of Hospital Purpose of Hospitalization/Surgery Date [facility] CVA 05/17/2012 [place] Pneumonia 03/17/2017 Family and Social History Cancer: No; Diabetes: No; Heart Disease: Yes - Mother,Father; Hereditary Spherocytosis: No; Hypertension: Yes - Mother,Father; Stroke: Yes - Father; Thyroid Problems: No; Tuberculosis: No; Current every day smoker; Marital Status - Single; Alcohol Use: Never; Drug Use: No History; Caffeine Use: Daily; Financial Concerns: No; Food, Clothing or Shelter Needs: No; Support System Lacking: No; Transportation Concerns: No; Advanced Directives: No; Patient does not want information on Advanced Directives Physician Affirmation I have reviewed and agree with the above information. Electronic Signature(s) Signed: 11/15/2017 4:10:39 PM By: [name] Signed: 11/16/2017 1:22:53 AM By: Mark Keeler PA-C Entered By: [name] on 11/15/2017 09:08:45 [name], [name] ([id number]) -------------------------------------------------------------------------------- SuperBill Details Patient Name: [name] Date of Service: 11/15/2017 Medical Record Number: [id number] Patient Account Number: [id number] Date of  Birth/Sex: 10/10/1950 (66 y.o. M) Treating RN: [name] Primary Care Provider: Royetta [name] Other Clinician: Referring Provider: Royetta [name] Treating Provider/Extender: [name], [name] Weeks in Treatment: 3 Diagnosis Coding ICD-10 Codes Code Description I87.2 Venous insufficiency (chronic) (peripheral) I89.0 Lymphedema, not elsewhere classified E11.622 Type 2 diabetes mellitus with other skin ulcer L97.812 Non-pressure chronic ulcer of other part of right lower leg with fat layer exposed  Facility Procedures CPT4 Code Description: [id number] - DEB SUBQ TISSUE 20 SQ CM/< ICD-10 Diagnosis Description G01.749 Non-pressure chronic ulcer of other part of right lower leg wit Modifier: h fat layer expo Quantity: 1 sed Physician Procedures CPT4 Code Description: [id number] - WC PHYS SUBQ TISS 20 SQ CM ICD-10 Diagnosis Description Y65.993 Non-pressure chronic ulcer of other part of right lower leg wit Modifier: h fat layer expo Quantity: 1 sed Electronic Signature(s) Signed: 11/16/2017 1:22:53 AM By: Mark Keeler PA-C Entered By: [name] on 11/15/2017 09:09:36

## 2017-11-17 NOTE — Progress Notes (Signed)
IBRAHEEM, VORIS ([id number]) Visit Report for 11/15/2017 Arrival Information Details Patient Name: JAQUAE, RIEVES Date of Service: 11/15/2017 8:30 AM Medical Record Number: [id number] Patient Account Number: [id number] Date of Birth/Sex: 1951/04/08 (66 y.o. M) Treating RN: Montey Hora Primary Care Linn Goetze: Royetta Crochet Other Clinician: Referring Shayanne Gomm: Royetta Crochet Treating Jordy Hewins/Extender: Melburn Hake, HOYT Weeks in Treatment: 3 Visit Information History Since Last Visit Added or deleted any medications: No Patient Arrived: Ambulatory Any new allergies or adverse reactions: No Arrival Time: 08:22 Had a fall or experienced change in No Accompanied By: self activities of daily living that may affect Transfer Assistance: None risk of falls: Patient Identification Verified: Yes Signs or symptoms of abuse/neglect since last visito No Secondary Verification Process Yes Hospitalized since last visit: No Completed: Implantable device outside of the clinic excluding No Patient Has Alerts: Yes cellular tissue based products placed in the center Patient Alerts: Patient on Blood since last visit: Thinner Has Dressing in Place as Prescribed: Yes DMII Has Compression in Place as Prescribed: Yes warfarin Pain Present Now: Yes Electronic Signature(s) Signed: 11/15/2017 4:10:25 PM By: Montey Hora Entered By: Montey Hora on 11/15/2017 08:25:06 Siegmann, [name] ([id number]) -------------------------------------------------------------------------------- Encounter Discharge Information Details Patient Name: [name] Date of Service: 11/15/2017 8:30 AM Medical Record Number: [id number] Patient Account Number: [id number] Date of Birth/Sex: 1951/01/15 (66 y.o. M) Treating RN: Roger Shelter Primary Care Jennifer Holland: Royetta Crochet Other Clinician: Referring Swade Shonka: Royetta Crochet Treating Baine Decesare/Extender: Melburn Hake, HOYT Weeks in Treatment: 3 Encounter  Discharge Information Items Discharge Condition: Stable Ambulatory Status: Ambulatory Discharge Destination: Home Transportation: Private Auto Schedule Follow-up Appointment: Yes Clinical Summary of Care: Electronic Signature(s) Signed: 11/16/2017 3:34:51 PM By: Roger Shelter Entered By: Roger Shelter on 11/15/2017 09:03:46 Coakley, [name] ([id number]) -------------------------------------------------------------------------------- Lower Extremity Assessment Details Patient Name: [name] Date of Service: 11/15/2017 8:30 AM Medical Record Number: [id number] Patient Account Number: [id number] Date of Birth/Sex: 1951-03-05 (66 y.o. M) Treating RN: Montey Hora Primary Care Lunetta Marina: Royetta Crochet Other Clinician: Referring Soleia Badolato: Royetta Crochet Treating Khalee Mazo/Extender: Melburn Hake, HOYT Weeks in Treatment: 3 Edema Assessment Assessed: [Left: No] [Right: No] E[Left: dema] [Right: :] Calf Left: Right: Point of Measurement: 40 cm From Medial Instep cm 43.1 cm Ankle Left: Right: Point of Measurement: 14 cm From Medial Instep cm 30.2 cm Vascular Assessment Pulses: Dorsalis Pedis Palpable: [Right:Yes] Posterior Tibial Extremity colors, hair growth, and conditions: Extremity Color: [Right:Hyperpigmented] Hair Growth on Extremity: [Right:No] Temperature of Extremity: [Right:Warm] Capillary Refill: [Right:< 3 seconds] Toe Nail Assessment Left: Right: Thick: Yes Discolored: Yes Deformed: Yes Improper Length and Hygiene: No Electronic Signature(s) Signed: 11/15/2017 4:10:25 PM By: Montey Hora Entered By: Montey Hora on 11/15/2017 08:26:53 Barret, [name] ([id number]) -------------------------------------------------------------------------------- Multi Wound Chart Details Patient Name: [name] Date of Service: 11/15/2017 8:30 AM Medical Record Number: [id number] Patient Account Number: [id number] Date of Birth/Sex: 11-Dec-1950 (66 y.o.  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 Secondary Etiology: Lymphedema Lymphedema N/A Comorbid History: Chronic Obstructive N/A N/A Pulmonary Disease (COPD), Arrhythmia, Deep Vein Thrombosis, Hypertension, Type II Diabetes, Gout, Neuropathy Date Acquired: 10/17/2017 10/17/2017 N/A  Weeks of Treatment: 3 3 N/A Wound Status: Open Converted N/A Measurements L x W x D 4.5x2.3x0.2 N/A N/A (cm) Area (cm) : 8.129 N/A N/A Volume (cm) : 1.626 N/A N/A % Reduction in Area: -4826.70% N/A N/A % Reduction in Volume: -10062.50% N/A N/A Classification: Grade 1 Grade 1 N/A Exudate Amount: Large N/A N/A Exudate Type: Serous N/A N/A Exudate Color: amber N/A N/A Wound Margin: Flat and Intact N/A N/A Granulation Amount: Medium (34-66%) N/A N/A Granulation Quality: Pink N/A N/A Necrotic Amount: Medium (34-66%) N/A N/A Matteo, Camryn ([id number]) Exposed Structures: Fat Layer (Subcutaneous N/A N/A Tissue) Exposed: Yes Fascia: No Tendon: No Muscle: No Joint: No Bone: No Epithelialization: Medium (34-66%) N/A N/A Periwound Skin Texture: Excoriation: No No Abnormalities Noted N/A Induration: No Callus: No Crepitus: No Rash: No Scarring: No Periwound Skin Moisture: Maceration: No No Abnormalities Noted N/A Dry/Scaly: No Periwound Skin Color: Atrophie Blanche: No No Abnormalities Noted  N/A Cyanosis: No Ecchymosis: No Erythema: No Hemosiderin Staining: No Mottled: No Pallor: No Rubor: No Temperature: No Abnormality N/A N/A Tenderness on Palpation: Yes No N/A Wound Preparation: Ulcer Cleansing: N/A N/A Rinsed/Irrigated with Saline, Other: soap and water Topical Anesthetic Applied: Other: lidocaine 4% Treatment Notes Electronic Signature(s) Signed: 11/15/2017 4:10:39 PM By: Alric Quan Entered By: Alric Quan on 11/15/2017 08:44:37 Alexie, [name] ([id number]) -------------------------------------------------------------------------------- Multi-Disciplinary Care Plan Details Patient Name: [name] Date of Service: 11/15/2017 8:30 AM Medical Record Number: [id number] Patient Account Number: [id number] Date of Birth/Sex: [birth year] (66 y.o. M) Treating RN: Ahmed Prima Primary Care Tansy Lorek: Royetta Crochet Other Clinician: Referring Vanassa Penniman: Royetta Crochet Treating Dejean Tribby/Extender: Melburn Hake, HOYT Weeks in Treatment: 3 Active Inactive ` Abuse / Safety / Falls / Self Care Management Nursing Diagnoses: Potential for falls Goals: Patient will not experience any injury related to falls Date Initiated: 10/25/2017 Target Resolution Date: 02/18/2018 Goal Status: Active Interventions: Assess Activities of Daily Living upon admission and as needed Assess fall risk on admission and as needed Assess: immobility, friction, shearing, incontinence upon admission and as needed Assess impairment of mobility on admission and as needed per policy Assess personal safety and home safety (as indicated) on admission and as needed Assess self care needs on admission and as needed Notes: ` Nutrition Nursing Diagnoses: Imbalanced nutrition Impaired glucose control: actual or potential Potential for alteratiion in Nutrition/Potential for imbalanced nutrition Goals: Patient/caregiver agrees to and verbalizes understanding of need to use nutritional  supplements and/or vitamins as prescribed Date Initiated: 10/25/2017 Target Resolution Date: 02/18/2018 Goal Status: Active Patient/caregiver will maintain therapeutic glucose control Date Initiated: 10/25/2017 Target Resolution Date: 02/18/2018 Goal Status: Active Interventions: Assess patient nutrition upon admission and as needed per policy Provide education on elevated blood sugars and impact on wound healing Provide education on nutrition RYLAND, TUNGATE ([id number]) Notes: ` Orientation to the Wound Care Program Nursing Diagnoses: Knowledge deficit related to the wound healing center program Goals: Patient/caregiver will verbalize understanding of the [place] Program Date Initiated: 10/25/2017 Target Resolution Date: 11/19/2017 Goal Status: Active Interventions: Provide education on orientation to the wound center Notes: ` Wound/Skin Impairment Nursing Diagnoses: Impaired tissue integrity Knowledge deficit related to ulceration/compromised skin integrity Goals: Ulcer/skin breakdown will have a volume reduction of 80% by week 12 Date Initiated: 10/25/2017 Target Resolution Date: 02/11/2018 Goal Status: Active Interventions: Assess patient/caregiver ability to perform ulcer/skin care regimen upon admission and as needed Assess ulceration(s) every visit Notes: Electronic Signature(s) Signed: 11/15/2017 4:10:39 PM By: Alric Quan Entered By: Alric Quan on 11/15/2017 08:44:25 Steines, Richey ([id number]) --------------------------------------------------------------------------------  Pain Assessment Details Patient Name: TYE, VIGO Date of Service: 11/15/2017 8:30 AM Medical Record Number: [id number] Patient Account Number: [id number] Date of Birth/Sex: February 26, 1951 (67 y.o. M) Treating RN: Montey Hora Primary Care Nico Rogness: Royetta Crochet Other Clinician: Referring Shaquon Gropp: Royetta Crochet Treating Yevette Knust/Extender: Melburn Hake,  HOYT Weeks in Treatment: 3 Active Problems Location of Pain Severity and Description of Pain Patient Has Paino Yes Site Locations Pain Location: Pain in Ulcers With Dressing Change: Yes Duration of the Pain. Constant / Intermittento Constant Pain Management and Medication Current Pain Management: Electronic Signature(s) Signed: 11/15/2017 4:10:25 PM By: Montey Hora Entered By: Montey Hora on 11/15/2017 08:25:19 Valenza, [name] ([id number]) -------------------------------------------------------------------------------- Patient/Caregiver Education Details Patient Name: [name] Date of Service: 11/15/2017 8:30 AM Medical Record Number: [id number] Patient Account Number: [id number] Date of Birth/Gender: 06-25-1950 (66 y.o. M) Treating RN: Roger Shelter Primary Care Physician: Royetta Crochet Other Clinician: Referring Physician: Royetta Crochet Treating Physician/Extender: Sharalyn Ink in Treatment: 3 Education Assessment Education Provided To: Patient Education Topics Provided Wound Debridement: Handouts: Wound Debridement Methods: Explain/Verbal Responses: State content correctly Wound/Skin Impairment: Handouts: Caring for Your Ulcer Methods: Explain/Verbal Responses: State content correctly Electronic Signature(s) Signed: 11/16/2017 3:34:51 PM By: Roger Shelter Entered By: Roger Shelter on 11/15/2017 09:04:01 Jansson, [name] ([id number]) -------------------------------------------------------------------------------- Wound Assessment Details Patient Name: [name] Date of Service: 11/15/2017 8:30 AM Medical Record Number: [id number] Patient Account Number: [id number] Date of Birth/Sex: 02-Apr-1951 (66 y.o. M) Treating RN: Montey Hora Primary Care Makinsley Schiavi: Royetta Crochet Other Clinician: Referring Makenzie Vittorio: Royetta Crochet Treating Edith Groleau/Extender: STONE III, HOYT Weeks in Treatment: 3 Wound Status Wound Number: 8  Primary Diabetic Wound/Ulcer of the Lower Extremity Etiology: Wound Location: Right Lower Leg - Medial, Proximal Secondary Lymphedema Wounding Event: Gradually Appeared Etiology: Date Acquired: 10/17/2017 Wound Open Weeks Of Treatment: 3 Status: Clustered Wound: No Comorbid Chronic Obstructive Pulmonary Disease History: (COPD), Arrhythmia, Deep Vein Thrombosis, Hypertension, Type II Diabetes, Gout, Neuropathy Photos Photo Uploaded By: Montey Hora on 11/15/2017 08:31:49 Wound Measurements Length: (cm) 4.5 Width: (cm) 2.3 Depth: (cm) 0.2 Area: (cm) 8.129 Volume: (cm) 1.626 % Reduction in Area: -4826.7% % Reduction in Volume: -10062.5% Epithelialization: Medium (34-66%) Tunneling: No Undermining: No Wound Description Classification: Grade 1 Wound Margin: Flat and Intact Exudate Amount: Large Exudate Type: Serous Exudate Color: amber Foul Odor After Cleansing: No Slough/Fibrino Yes Wound Bed Granulation Amount: Medium (34-66%) Exposed Structure Granulation Quality: Pink Fascia Exposed: No Necrotic Amount: Medium (34-66%) Fat Layer (Subcutaneous Tissue) Exposed: Yes Necrotic Quality: Adherent Slough Tendon Exposed: No Muscle Exposed: No Joint Exposed: No Stutz, Zamire ([id number]) Bone Exposed: No Periwound Skin Texture Texture Color No Abnormalities Noted: No No Abnormalities Noted: No Callus: No Atrophie Blanche: No Crepitus: No Cyanosis: No Excoriation: No Ecchymosis: No Induration: No Erythema: No Rash: No Hemosiderin Staining: No Scarring: No Mottled: No Pallor: No Moisture Rubor: No No Abnormalities Noted: No Dry / Scaly: No Temperature / Pain Maceration: No Temperature: No Abnormality Tenderness on Palpation: Yes Wound Preparation Ulcer Cleansing: Rinsed/Irrigated with Saline, Other: soap and water, Topical Anesthetic Applied: Other: lidocaine 4%, Treatment Notes Wound #8 (Right, Proximal, Medial Lower Leg) 1. Cleansed  with: Clean wound with Normal Saline 2. Anesthetic Topical Lidocaine 4% cream to wound bed prior to debridement 3. Peri-wound Care: Barrier cream Moisturizing lotion 4. Dressing Applied: Prisma Ag 5. Secondary Dressing Applied ABD Pad 7. Secured with 3 Layer Compression System - Right Lower Extremity Notes karamax, abd pad Electronic Signature(s) Signed: 11/15/2017 4:10:25 PM By: Montey Hora Entered By: Montey Hora on 11/15/2017  16:10:96 BRODIE, CORRELL ([id number]) -------------------------------------------------------------------------------- Wound Assessment Details Patient Name: EUELL, SCHIFF Date of Service: 11/15/2017 8:30 AM Medical Record Number: [id number] Patient Account Number: [id number] Date of Birth/Sex: [birth year] (66 y.o. M) Treating RN: Montey Hora Primary Care Ethlyn Alto: Royetta Crochet Other Clinician: Referring Jermon Chalfant: Royetta Crochet Treating Eknoor Novack/Extender: STONE III, HOYT Weeks in Treatment: 3 Wound Status Wound Number: 9 Primary Etiology: Diabetic Wound/Ulcer of the Lower Extremity Wound Location: Right, Distal, Medial Lower Leg Secondary Lymphedema Wounding Event: Gradually Appeared Etiology: Date Acquired: 10/17/2017 Wound Status: Converted Weeks Of Treatment: 3 Clustered Wound: No Photos Photo Uploaded By: Montey Hora on 11/15/2017 08:31:49 Wound Description Classification: Grade 1 Periwound Skin Texture Texture Color No Abnormalities Noted: No No Abnormalities Noted: No Moisture No Abnormalities Noted: No Electronic Signature(s) Signed: 11/15/2017 4:10:25 PM By: Montey Hora Entered By: Montey Hora on 11/15/2017 08:25:58 Wurzel, [name] ([id number]) -------------------------------------------------------------------------------- Vitals Details Patient Name: [name] Date of Service: 11/15/2017 8:30 AM Medical Record Number: [id number] Patient Account Number: [id number] Date of Birth/Sex: 1950-05-28  (66 y.o. M) Treating RN: Montey Hora Primary Care Katai Marsico: Royetta Crochet Other Clinician: Referring Marcee Jacobs: Royetta Crochet Treating Leyanna Bittman/Extender: STONE III, HOYT Weeks in Treatment: 3 Vital Signs Time Taken: 08:25 Temperature (F): 98.3 Height (in): 74 Pulse (bpm): 55 Weight (lbs): 236 Respiratory Rate (breaths/min): 18 Body Mass Index (BMI): 30.3 Blood Pressure (mmHg): 144/79 Reference Range: 80 - 120 mg / dl Electronic Signature(s) Signed: 11/15/2017 4:10:25 PM By: Montey Hora Entered By: Montey Hora on 11/15/2017 08:25:44

## 2017-11-22 ENCOUNTER — Encounter: Payer: Medicare Other | Admitting: Physician Assistant

## 2017-11-22 DIAGNOSIS — E11622 Type 2 diabetes mellitus with other skin ulcer: Secondary | ICD-10-CM | POA: Diagnosis not present

## 2017-11-23 NOTE — Progress Notes (Signed)
[name], [name] ([id number]) Visit Report for 11/22/2017 Arrival Information Details Patient Name: [name], [name] Date of Service: 11/22/2017 8:30 AM Medical Record Number: [id number] Patient Account Number: [id number] Date of Birth/Sex: 04/19/1951 (67 y.o. M) Treating RN: Roger Shelter Primary Care Trinidad Ingle: Royetta Crochet Other Clinician: Referring Zachary Nole: Royetta Crochet Treating Dray Dente/Extender: Melburn Hake, HOYT Weeks in Treatment: 4 Visit Information History Since Last Visit All ordered tests and consults were completed: No Patient Arrived: Ambulatory Added or deleted any medications: No Arrival Time: 08:31 Any new allergies or adverse reactions: No Accompanied By: self Had a fall or experienced change in No Transfer Assistance: None activities of daily living that may affect Patient Identification Verified: Yes risk of falls: Secondary Verification Process Yes Signs or symptoms of abuse/neglect since last visito No Completed: Hospitalized since last visit: No Patient Has Alerts: Yes Implantable device outside of the clinic excluding No Patient Alerts: Patient on Blood cellular tissue based products placed in the center Thinner since last visit: DMII Pain Present Now: Yes warfarin Electronic Signature(s) Signed: 11/22/2017 11:46:23 AM By: Roger Shelter Entered By: Roger Shelter on 11/22/2017 08:32:13 Karpf, [name] ([id number]) -------------------------------------------------------------------------------- Encounter Discharge Information Details Patient Name: [name] Date of Service: 11/22/2017 8:30 AM Medical Record Number: [id number] Patient Account Number: [id number] Date of Birth/Sex: 27-Dec-1950 (67 y.o. M) Treating RN: Roger Shelter Primary Care Demondre Aguas: Royetta Crochet Other Clinician: Referring Ercil Cassis: Royetta Crochet Treating Mikaila Grunert/Extender: Melburn Hake, HOYT Weeks in Treatment: 4 Encounter Discharge Information  Items Discharge Condition: Stable Ambulatory Status: Ambulatory Discharge Destination: Home Transportation: Private Auto Schedule Follow-up Appointment: Yes Clinical Summary of Care: Electronic Signature(s) Signed: 11/22/2017 11:46:23 AM By: Roger Shelter Entered By: Roger Shelter on 11/22/2017 09:17:13 Caton, [name] ([id number]) -------------------------------------------------------------------------------- Lower Extremity Assessment Details Patient Name: [name] Date of Service: 11/22/2017 8:30 AM Medical Record Number: [id number] Patient Account Number: [id number] Date of Birth/Sex: 06-05-1950 (67 y.o. M) Treating RN: Roger Shelter Primary Care Eladia Frame: Royetta Crochet Other Clinician: Referring Rachel Rison: Royetta Crochet Treating Shonia Skilling/Extender: STONE III, HOYT Weeks in Treatment: 4 Edema Assessment Assessed: [Left: No] [Right: No] E[Left: dema] [Right: :] Calf Left: Right: Point of Measurement: 40 cm From Medial Instep cm 43.8 cm Ankle Left: Right: Point of Measurement: 14 cm From Medial Instep cm 29 cm Vascular Assessment Pulses: Dorsalis Pedis Palpable: [Right:Yes] Posterior Tibial Extremity colors, hair growth, and conditions: Extremity Color: [Right:Hyperpigmented] Temperature of Extremity: [Right:Warm] Capillary Refill: [Right:< 3 seconds] Toe Nail Assessment Left: Right: Thick: Yes Discolored: Yes Deformed: Yes Improper Length and Hygiene: Yes Electronic Signature(s) Signed: 11/22/2017 11:46:23 AM By: Roger Shelter Entered By: Roger Shelter on 11/22/2017 08:43:22 Hauss, [name] ([id number]) -------------------------------------------------------------------------------- Multi Wound Chart Details Patient Name: [name] Date of Service: 11/22/2017 8:30 AM Medical Record Number: [id number] Patient Account Number: [id number] Date of Birth/Sex: [birth year] (67 y.o. 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] [N/A:N/A] Secondary Etiology: [8:Lymphedema] [N/A:N/A] Comorbid History: [8:Chronic Obstructive Pulmonary Disease (COPD), Arrhythmia, Deep Vein Thrombosis, Hypertension, Type II Diabetes, Gout, Neuropathy] [N/A:N/A] Date Acquired: [8:10/17/2017] [N/A:N/A] Weeks of Treatment: [8:4] [N/A:N/A] Wound Status: [8:Open] [N/A:N/A] Measurements L x W x D [8:4.5x2.5x0.3] [N/A:N/A] (cm) Area (cm) : [8:8.836] [N/A:N/A] Volume (cm) : [  8:2.651] [N/A:N/A] % Reduction in Area: [8:-5255.20%] [N/A:N/A] % Reduction in Volume: [8:-16468.70%] [N/A:N/A] Classification: [8:Grade 1] [N/A:N/A] Exudate Amount: [8:Large] [N/A:N/A] Exudate Type: [8:Serosanguineous] [N/A:N/A] Exudate Color: [8:red, brown] [N/A:N/A] Wound Margin: [8:Flat and Intact] [N/A:N/A] Granulation Amount: [8:Medium (34-66%)] [N/A:N/A] Granulation Quality: [8:Pink] [N/A:N/A] Necrotic Amount: [8:Medium (34-66%)] [N/A:N/A] Exposed Structures: [8:Fat Layer (Subcutaneous Tissue) Exposed: Yes Fascia: No Tendon: No Muscle: No Joint: No Bone: No] [N/A:N/A] Epithelialization: Medium (34-66%) N/A N/A Periwound Skin Texture: Excoriation: No N/A N/A Induration: No Callus: No Crepitus: No Rash: No Scarring: No Periwound Skin Moisture: Maceration: Yes N/A N/A Dry/Scaly: No Periwound Skin Color: Atrophie Blanche: No N/A N/A Cyanosis: No Ecchymosis: No Erythema: No Hemosiderin Staining: No Mottled: No Pallor: No Rubor:  No Temperature: No Abnormality N/A N/A Tenderness on Palpation: Yes N/A N/A Wound Preparation: Ulcer Cleansing: N/A N/A Rinsed/Irrigated with Saline, Other: soap and water Topical Anesthetic Applied: Other: lidocaine 4% Treatment Notes Electronic Signature(s) Signed: 11/22/2017 5:13:20 PM By: Alric Quan Entered By: Alric Quan on 11/22/2017 09:00:57 Orman, [name] ([id number]) -------------------------------------------------------------------------------- Multi-Disciplinary Care Plan Details Patient Name: [name] Date of Service: 11/22/2017 8:30 AM Medical Record Number: [id number] Patient Account Number: [id number] Date of Birth/Sex: [birth year] (67 y.o. M) Treating RN: Ahmed Prima Primary Care Nikaya Nasby: Royetta Crochet Other Clinician: Referring Yousef Huge: Royetta Crochet Treating Duante Arocho/Extender: Melburn Hake, HOYT Weeks in Treatment: 4 Active Inactive ` Abuse / Safety / Falls / Self Care Management Nursing Diagnoses: Potential for falls Goals: Patient will not experience any injury related to falls Date Initiated: 10/25/2017 Target Resolution Date: 02/18/2018 Goal Status: Active Interventions: Assess Activities of Daily Living upon admission and as needed Assess fall risk on admission and as needed Assess: immobility, friction, shearing, incontinence upon admission and as needed Assess impairment of mobility on admission and as needed per policy Assess personal safety and home safety (as indicated) on admission and as needed Assess self care needs on admission and as needed Notes: ` Nutrition Nursing Diagnoses: Imbalanced nutrition Impaired glucose control: actual or potential Potential for alteratiion in Nutrition/Potential for imbalanced nutrition Goals: Patient/caregiver agrees to and verbalizes understanding of need to use nutritional supplements and/or vitamins as prescribed Date Initiated: 10/25/2017 Target Resolution Date:  02/18/2018 Goal Status: Active Patient/caregiver will maintain therapeutic glucose control Date Initiated: 10/25/2017 Target Resolution Date: 02/18/2018 Goal Status: Active Interventions: Assess patient nutrition upon admission and as needed per policy Provide education on elevated blood sugars and impact on wound healing Provide education on nutrition JATHNIEL, SMELTZER ([id number]) Notes: ` Orientation to the Wound Care Program Nursing Diagnoses: Knowledge deficit related to the wound healing center program Goals: Patient/caregiver will verbalize understanding of the [place] Program Date Initiated: 10/25/2017 Target Resolution Date: 11/19/2017 Goal Status: Active Interventions: Provide education on orientation to the wound center Notes: ` Wound/Skin Impairment Nursing Diagnoses: Impaired tissue integrity Knowledge deficit related to ulceration/compromised skin integrity Goals: Ulcer/skin breakdown will have a volume reduction of 80% by week 12 Date Initiated: 10/25/2017 Target Resolution Date: 02/11/2018 Goal Status: Active Interventions: Assess patient/caregiver ability to perform ulcer/skin care regimen upon admission and as needed Assess ulceration(s) every visit Notes: Electronic Signature(s) Signed: 11/22/2017 5:13:20 PM By: Alric Quan Entered By: Alric Quan on 11/22/2017 09:00:46 Foster, [name] ([id number]) -------------------------------------------------------------------------------- Pain Assessment Details Patient Name: [name] Date of Service: 11/22/2017 8:30 AM Medical Record Number: [id number] Patient Account Number: [id number] Date of Birth/Sex: 1950-06-04 (67 y.o. M) Treating RN: Roger Shelter Primary Care Cecilio Ohlrich: Royetta Crochet Other Clinician: Referring Queen Abbett: Royetta Crochet Treating Sheri Prows/Extender: STONE III, HOYT Weeks in  Treatment: 4 Active Problems Location of Pain Severity and Description of  Pain Patient Has Paino No Site Locations Pain Management and Medication Current Pain Management: Electronic Signature(s) Signed: 11/22/2017 11:46:23 AM By: Roger Shelter Entered By: Roger Shelter on 11/22/2017 08:32:22 Brose, [name] ([id number]) -------------------------------------------------------------------------------- Patient/Caregiver Education Details Patient Name: [name] Date of Service: 11/22/2017 8:30 AM Medical Record Number: [id number] Patient Account Number: [id number] Date of Birth/Gender: [birth year] (67 y.o. M) Treating RN: Roger Shelter Primary Care Physician: Royetta Crochet Other Clinician: Referring Physician: Royetta Crochet Treating Physician/Extender: Sharalyn Ink in Treatment: 4 Education Assessment Education Provided To: Patient Education Topics Provided Wound Debridement: Handouts: Wound Debridement Methods: Explain/Verbal Responses: State content correctly Wound/Skin Impairment: Handouts: Caring for Your Ulcer Methods: Explain/Verbal Responses: State content correctly Electronic Signature(s) Signed: 11/22/2017 11:46:23 AM By: Roger Shelter Entered By: Roger Shelter on 11/22/2017 09:17:29 Symonds, [name] ([id number]) -------------------------------------------------------------------------------- Wound Assessment Details Patient Name: [name] Date of Service: 11/22/2017 8:30 AM Medical Record Number: [id number] Patient Account Number: [id number] Date of Birth/Sex: [birth year] (67 y.o. M) Treating RN: Roger Shelter Primary Care Destin Vinsant: Royetta Crochet Other Clinician: Referring Chigozie Basaldua: Royetta Crochet Treating Jayni Prescher/Extender: STONE III, HOYT Weeks in Treatment: 4 Wound Status Wound Number: 8 Primary Diabetic Wound/Ulcer of the Lower Extremity Etiology: Wound Location: Right Lower Leg - Medial, Proximal Secondary Lymphedema Wounding Event: Gradually Appeared Etiology: Date Acquired:  10/17/2017 Wound Open Weeks Of Treatment: 4 Status: Clustered Wound: No Comorbid Chronic Obstructive Pulmonary Disease History: (COPD), Arrhythmia, Deep Vein Thrombosis, Hypertension, Type II Diabetes, Gout, Neuropathy Wound Measurements Length: (cm) 4.5 Width: (cm) 2.5 Depth: (cm) 0.3 Area: (cm) 8.836 Volume: (cm) 2.651 % Reduction in Area: -5255.2% % Reduction in Volume: -16468.7% Epithelialization: Medium (34-66%) Tunneling: No Undermining: No Wound Description Classification: Grade 1 Foul Odo Wound Margin: Flat and Intact Slough/F Exudate Amount: Large Exudate Type: Serosanguineous Exudate Color: red, brown r After Cleansing: No ibrino Yes Wound Bed Granulation Amount: Medium (34-66%) Exposed Structure Granulation Quality: Pink Fascia Exposed: No Necrotic Amount: Medium (34-66%) Fat Layer (Subcutaneous Tissue) Exposed: Yes Necrotic Quality: Adherent Slough Tendon Exposed: No Muscle Exposed: No Joint Exposed: No Bone Exposed: No Periwound Skin Texture Texture Color No Abnormalities Noted: No No Abnormalities Noted: No Callus: No Atrophie Blanche: No Crepitus: No Cyanosis: No Excoriation: No Ecchymosis: No Induration: No Erythema: No Rash: No Hemosiderin Staining: No Scarring: No Mottled: No Pallor: No Moisture Rubor: No Dauphin, Valerie ([id number]) No Abnormalities Noted: No Temperature / Pain Dry / Scaly: No Temperature: No Abnormality Maceration: Yes Tenderness on Palpation: Yes Wound Preparation Ulcer Cleansing: Rinsed/Irrigated with Saline, Other: soap and water, Topical Anesthetic Applied: Other: lidocaine 4%, Treatment Notes Wound #8 (Right, Proximal, Medial Lower Leg) 1. Cleansed with: Clean wound with Normal Saline 2. Anesthetic Topical Lidocaine 4% cream to wound bed prior to debridement 3. Peri-wound Care: Moisturizing lotion 4. Dressing Applied: Iodoflex 5. Secondary Dressing Applied ABD Pad 7. Secured with 3 Layer  Compression System - Right Lower Extremity Notes xtrasorb, abd pad Electronic Signature(s) Signed: 11/22/2017 11:46:23 AM By: Roger Shelter Entered By: Roger Shelter on 11/22/2017 08:41:37 Drennen, [name] ([id number]) -------------------------------------------------------------------------------- Vitals Details Patient Name: [name] Date of Service: 11/22/2017 8:30 AM Medical Record Number: [id number] Patient Account Number: [id number] Date of Birth/Sex: 02/06/1951 (67 y.o. M) Treating RN: Roger Shelter Primary Care Nimah Uphoff: Royetta Crochet Other Clinician: Referring Giah Fickett: Royetta Crochet Treating Glenora Morocho/Extender: STONE III, HOYT Weeks in Treatment: 4 Vital Signs Time Taken: 08:33 Temperature (F): 98.4 Height (in): 74 Pulse (bpm): 53 Weight (lbs): 236 Respiratory Rate (breaths/min):  18 Body Mass Index (BMI): 30.3 Blood Pressure (mmHg): 154/90 Reference Range: 80 - 120 mg / dl Electronic Signature(s) Signed: 11/22/2017 11:46:23 AM By: Roger Shelter Entered By: Roger Shelter on 11/22/2017 08:40:53

## 2017-11-23 NOTE — Progress Notes (Signed)
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 fielding dramatically and looks to be doing excellent. I'm extremely happy with the progress. He  still has pain rated to be a 4-5/10 but the wound looks great. 07/20/17; the patient's right medial lower extremity ulcer is closed. He has good edema control. Unfortunately he does not really have adequate compression stockings. 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 He has been treated with compression stockings for some time now in fact we have notes having treated him even as far back as 2016 here in our clinic. Fortunately he has excellent blood flow and tends to heal well. Unfortunately despite wearing his compression stockings, keeping his legs elevated, being on fluid pills (Lasix) and doing everything he can to try to help with fluid he still continues to have issues with this reopening. I think that he may be a good candidate for compression/lymphedema pumps. We have never attempted to get these for him previously. No fevers, chills, nausea, or vomiting noted at this time. Currently patient states his ulcer does hurt although not as bad as some in the past it's also not as large as it has been in the past when it reopened he talked is somewhat early. Obviously this is good news. YAROSLAV, GOMBOS ([id number]) 11/01/17 on evaluation today patient appears to be doing better in regard to his lower extremity ulcer. He has been tolerating the wraps without complication. Overall things seem to be showing signs of good improvement which is excellent. That's what were looking for obviously. He has always done well with the wraps in the past as well. Fortunately there does not appear to be any evidence of infection at this point in time. 11/08/17 on evaluation today patient  appears to be doing rather okay in regard to his right medial malleolus ulcer. With that being said he still seems to have some fluid control issues at this point. With that being said I think we may want to switch from the silvercell to maybe Prisma to try to help this and prove more significantly as far as the moisture control is concerned. Overall I do think she's made some progress and I believe that we may be able to aid him along with just a few minor changes. His swelling seems to be doing well with the wrap. 11/15/17 on evaluation today patient's right medial malleolus ulcer appears to be very moist as far as drainage is concerned. It does appear that the Drawtex was not as beneficial  for him as we would've liked. He fortunately has no evidence of infection at this time unfortunately again he did have a lot of maceration noted at this point. His wound was a little bit larger. Otherwise there is no evidence of active infection at this point. 11/22/17 on evaluation today patient's wound actually is a little bit larger and more macerated today compared to previous. The dressings that were applying do seem to be helping to trap the fluid nonetheless the wound is a little bit larger and has spread. With that being said there still slough in the surface of the wound he's had a lot of increased pain therefore I'm trying to avoid sharp debridement today at the patient request. Patient History Information obtained from Patient. Family History Heart Disease - Mother,Father, Hypertension - Mother,Father, Stroke - Father, No family history of Cancer, Diabetes, Hereditary Spherocytosis, Thyroid Problems, Tuberculosis. Social History Current every day smoker, Marital Status - Single, Alcohol Use - Never, Drug Use - No History, Caffeine Use - Daily. Medical History Hospitalization/Surgery History - 05/17/2012, [facility], CVA. - 03/17/2017, [facility], Pneumonia. Medical And Surgical History  Notes Ear/Nose/Mouth/Throat difficulty speaking Respiratory Hospitalized for Pneumonia 02/2017 Neurologic CVA in 2014 Review of Systems (ROS) Constitutional Symptoms (General Health) Denies complaints or symptoms of Fever, Chills. Respiratory The patient has no complaints or symptoms. Cardiovascular Complains or has symptoms of LE edema. Psychiatric The patient has no complaints or symptoms. Burbach, [name] ([id number]) Objective Constitutional Well-nourished and well-hydrated in no acute distress. Vitals Time Taken: 8:33 AM, Height: 74 in, Weight: 236 lbs, BMI: 30.3, Temperature: 98.4 F, Pulse: 53 bpm, Respiratory Rate: 18 breaths/min, Blood Pressure: 154/90 mmHg. Respiratory normal breathing without difficulty. clear to auscultation bilaterally. Cardiovascular regular rate and rhythm with normal S1, S2. 1+ pitting edema of the bilateral lower extremities. Psychiatric this patient is able to make decisions and demonstrates good insight into disease process. Alert and Oriented x 3. pleasant and cooperative. General Notes: Patient's wound bed actually does show some Slough noted over the surface there's also some areas of epithelialization however this does appear to be overall larger than last week's evaluation. Unfortunately the patient is also having increased pain despite the fact that he does not have any or thing I'm concerned about the possibility of infection at this time. Integumentary (Hair, Skin) Wound #8 status is Open. Original cause of wound was Gradually Appeared. The wound is located on the Right,Proximal,Medial Lower Leg. The wound measures 4.5cm length x 2.5cm width x 0.3cm depth; 8.836cm^2 area and 2.651cm^3 volume. There is Fat Layer (Subcutaneous Tissue) Exposed exposed. There is no tunneling or undermining noted. There is a large amount of serosanguineous drainage noted. The wound margin is flat and intact. There is medium (34-66%) pink granulation  within the wound bed. There is a medium (34-66%) amount of necrotic tissue within the wound bed including Adherent Slough. The periwound skin appearance exhibited: Maceration. The periwound skin appearance did not exhibit: Callus, Crepitus, Excoriation, Induration, Rash, Scarring, Dry/Scaly, Atrophie Blanche, Cyanosis, Ecchymosis, Hemosiderin Staining, Mottled, Pallor, Rubor, Erythema. Periwound temperature was noted as No Abnormality. The periwound has tenderness on palpation. 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 Cleanse wound with mild soap and water Anesthetic (add to Medication List): Wound #8 Right,Proximal,Medial Lower  Leg: Topical Lidocaine 4% cream applied to wound bed prior to debridement (In Clinic Only). Skin Barriers/Peri-Wound Care: Wound #8 Right,Proximal,Medial Lower Leg: Moisturizing lotion Primary Wound Dressing: Wound #8 Right,Proximal,Medial Lower Leg: Iodoflex Secondary Dressing: Wound #8 Right,Proximal,Medial Lower Leg: ABD pad XtraSorb Dressing Change Frequency: Wound #8 Right,Proximal,Medial Lower Leg: Change dressing every week Follow-up Appointments: Wound #8 Right,Proximal,Medial Lower Leg: Return Appointment in 1 week. Edema Control: Wound #8 Right,Proximal,Medial Lower Leg: 3 Layer Compression System - Right Lower Extremity - unna to anchor Other: - order bilateral compression pumps Additional Orders / Instructions: Wound #8 Right,Proximal,Medial Lower Leg: Increase protein intake. The following medication(s) was prescribed: lidocaine topical 4 % cream 1 1 cream topical was prescribed at facility doxycycline  hyclate oral 100 mg capsule 1 1 capsule oral taken 2 times a day for 10 days starting 11/22/2017 At this point I'm gonna go ahead and send in a prescription doxycycline to the patient's pharmacy. Hopefully this will empirically treat any infection that may be started. If this does not show signs of improvement and there is increased drainage we may culture at the next visit will see how things appear. He's in agreement with this plan he will avoid using any iron supplementation during the time that is on the antibiotic therapy. Otherwise will see him back for reevaluation in one weeks time. Please see above for specific wound care orders. We will see patient for re-evaluation in 1 week(s) here in the clinic. If anything worsens or changes patient will contact our office for additional recommendations. Electronic Signature(s) Signed: 11/22/2017 11:53:57 PM By: Worthy Keeler PA-C Entered By: Worthy Keeler on 11/22/2017 09:09:53 Lindell, [name] ([id number]) -------------------------------------------------------------------------------- ROS/PFSH Details Patient Name: [name] Date of Service: 11/22/2017 8:30 AM Medical Record Number: [id number] Patient Account Number: [id number] Date of Birth/Sex: [birth year] (66 y.o. M) Treating RN: Ahmed Prima Primary Care Provider: Royetta Crochet Other Clinician: Referring Provider: Royetta Crochet Treating Provider/Extender: Melburn Hake, HOYT Weeks in Treatment: 4 Information Obtained From Patient Wound History Do you currently have one or more open woundso Yes How many open wounds do you currently haveo 1 Approximately how long have you had your woundso 1 week How have you been treating your wound(s) until nowo lotion Has your wound(s) ever healed and then re-openedo Yes Have you had any lab work done in the past montho No Have you tested positive for an antibiotic resistant organism (MRSA, VRE)o No Have you tested positive for  osteomyelitis (bone infection)o No Have you had any tests for circulation on your legso Yes Who ordered the testo [facility] Where was the test doneo [facility] Constitutional Symptoms (General Health) Complaints and Symptoms: Negative for: Fever; Chills Cardiovascular Complaints and Symptoms: Positive for: LE edema Medical History: Positive for: Arrhythmia - a fib; Deep Vein Thrombosis; Hypertension Negative for: Angina; Congestive Heart Failure; Coronary Artery Disease; Hypotension; Myocardial Infarction; Peripheral Arterial Disease; Peripheral Venous Disease; Phlebitis; Vasculitis Eyes Medical History: Negative for: Cataracts; Glaucoma; Optic Neuritis Ear/Nose/Mouth/Throat Medical History: Negative for: Chronic sinus problems/congestion; Middle ear problems Past Medical History Notes: difficulty speaking Hematologic/Lymphatic Medical History: Negative for: Anemia; Hemophilia; Human Immunodeficiency Virus; Lymphedema; Sickle Cell Disease Respiratory Carrasco, Khrystian ([id number]) Complaints and Symptoms: No Complaints or Symptoms Medical History: Positive for: Chronic Obstructive Pulmonary Disease (COPD) Negative for: Aspiration; Asthma; Pneumothorax; Sleep Apnea; Tuberculosis Past Medical History Notes: Hospitalized for Pneumonia 02/2017 Gastrointestinal Medical History: Negative for: Cirrhosis ; Colitis; Crohnos; Hepatitis A; Hepatitis B; Hepatitis C Endocrine Medical History: Positive for: Type II Diabetes Negative  for: Type I Diabetes Time with diabetes: since 2017 Treated with: Diet Blood sugar tested every day: No Genitourinary Medical History: Negative for: End Stage Renal Disease Immunological Medical History: Negative for: Lupus Erythematosus; Raynaudos; Scleroderma Integumentary (Skin) Medical History: Negative for: History of Burn; History of pressure wounds Musculoskeletal Medical History: Positive for: Gout Negative for: Rheumatoid Arthritis;  Osteoarthritis; Osteomyelitis Neurologic Medical History: Positive for: Neuropathy Negative for: Dementia; Quadriplegia; Paraplegia; Seizure Disorder Past Medical History Notes: CVA in 2014 Oncologic Medical History: Negative for: Received Chemotherapy; Received Radiation Psychiatric SHADI, SESSLER ([id number]) Complaints and Symptoms: No Complaints or Symptoms Medical History: Negative for: Anorexia/bulimia; Confinement Anxiety Immunizations Pneumococcal Vaccine: Received Pneumococcal Vaccination: Yes Immunization Notes: up to date Implantable Devices Hospitalization / Surgery History Name of Hospital Purpose of Hospitalization/Surgery Date [facility] CVA 05/17/2012 [place] Pneumonia 03/17/2017 Family and Social History Cancer: No; Diabetes: No; Heart Disease: Yes - Mother,Father; Hereditary Spherocytosis: No; Hypertension: Yes - Mother,Father; Stroke: Yes - Father; Thyroid Problems: No; Tuberculosis: No; Current every day smoker; Marital Status - Single; Alcohol Use: Never; Drug Use: No History; Caffeine Use: Daily; Financial Concerns: No; Food, Clothing or Shelter Needs: No; Support System Lacking: No; Transportation Concerns: No; Advanced Directives: No; Patient does not want information on Advanced Directives Physician Affirmation I have reviewed and agree with the above information. Electronic Signature(s) Signed: 11/22/2017 5:13:20 PM By: Alric Quan Signed: 11/22/2017 11:53:57 PM By: Worthy Keeler PA-C Entered By: Worthy Keeler on 11/22/2017 09:06:51 Campau, [name] ([id number]) -------------------------------------------------------------------------------- SuperBill Details Patient Name: [name] Date of Service: 11/22/2017 Medical Record Number: [id number] Patient Account Number: [id number] Date of Birth/Sex: Sep 30, 1950 (66 y.o. M) Treating RN: Ahmed Prima Primary Care Provider: Royetta Crochet Other Clinician: Referring Provider: Royetta Crochet Treating Provider/Extender: Melburn Hake, HOYT Weeks in Treatment: 4 Diagnosis Coding ICD-10 Codes Code Description I87.2 Venous insufficiency (chronic) (peripheral) I89.0 Lymphedema, not elsewhere classified E11.622 Type 2 diabetes mellitus with other skin ulcer L97.812 Non-pressure chronic ulcer of other part of right lower leg with fat layer exposed Facility Procedures CPT4 Code Description: [id number] (Facility Use Only) [phone] - APPLY MULTLAY COMPRS LWR RT LEG Modifier: Quantity: 1 Physician Procedures CPT4 Code Description: [id number] - WC PHYS LEVEL 4 - EST PT ICD-10 Diagnosis Description I87.2 Venous insufficiency (chronic) (peripheral) I89.0 Lymphedema, not elsewhere classified E11.622 Type 2 diabetes mellitus with other skin ulcer L97.812  Non-pressure chronic ulcer of other part of right lower leg wi Modifier: 25 th fat layer expos Quantity: 1 ed Electronic Signature(s) Signed: 11/22/2017 11:53:57 PM By: Worthy Keeler PA-C Entered By: Worthy Keeler on 11/22/2017 09:10:10

## 2017-11-24 ENCOUNTER — Encounter: Payer: Self-pay | Admitting: Emergency Medicine

## 2017-11-24 ENCOUNTER — Emergency Department
Admission: EM | Admit: 2017-11-24 | Discharge: 2017-11-24 | Disposition: A | Discharge status: Home / Self Care | Payer: Medicare Other | Attending: Emergency Medicine | Admitting: Emergency Medicine

## 2017-11-24 DIAGNOSIS — R109 Unspecified abdominal pain: Secondary | ICD-10-CM

## 2017-11-24 DIAGNOSIS — Z7982 Long term (current) use of aspirin: Secondary | ICD-10-CM | POA: Diagnosis not present

## 2017-11-24 DIAGNOSIS — Z7901 Long term (current) use of anticoagulants: Secondary | ICD-10-CM | POA: Diagnosis not present

## 2017-11-24 DIAGNOSIS — Z8673 Personal history of transient ischemic attack (TIA), and cerebral infarction without residual deficits: Secondary | ICD-10-CM | POA: Insufficient documentation

## 2017-11-24 DIAGNOSIS — M791 Myalgia, unspecified site: Secondary | ICD-10-CM | POA: Diagnosis not present

## 2017-11-24 DIAGNOSIS — I4891 Unspecified atrial fibrillation: Secondary | ICD-10-CM | POA: Insufficient documentation

## 2017-11-24 DIAGNOSIS — E119 Type 2 diabetes mellitus without complications: Secondary | ICD-10-CM | POA: Insufficient documentation

## 2017-11-24 DIAGNOSIS — M7918 Myalgia, other site: Secondary | ICD-10-CM

## 2017-11-24 DIAGNOSIS — F17210 Nicotine dependence, cigarettes, uncomplicated: Secondary | ICD-10-CM | POA: Diagnosis not present

## 2017-11-24 DIAGNOSIS — Z79899 Other long term (current) drug therapy: Secondary | ICD-10-CM | POA: Insufficient documentation

## 2017-11-24 DIAGNOSIS — Z7984 Long term (current) use of oral hypoglycemic drugs: Secondary | ICD-10-CM | POA: Diagnosis not present

## 2017-11-24 DIAGNOSIS — Z86718 Personal history of other venous thrombosis and embolism: Secondary | ICD-10-CM | POA: Diagnosis not present

## 2017-11-24 DIAGNOSIS — I10 Essential (primary) hypertension: Secondary | ICD-10-CM | POA: Insufficient documentation

## 2017-11-24 DIAGNOSIS — R1084 Generalized abdominal pain: Secondary | ICD-10-CM | POA: Diagnosis not present

## 2017-11-24 LAB — CBC
HCT: 35.6 % — ABNORMAL LOW (ref 40.0–52.0)
Hemoglobin: 11.6 g/dL — ABNORMAL LOW (ref 13.0–18.0)
MCH: 25.2 pg — AB (ref 26.0–34.0)
MCHC: 32.5 g/dL (ref 32.0–36.0)
MCV: 77.5 fL — AB (ref 80.0–100.0)
PLATELETS: 142 10*3/uL — AB (ref 150–440)
RBC: 4.59 MIL/uL (ref 4.40–5.90)
RDW: 16.5 % — ABNORMAL HIGH (ref 11.5–14.5)
WBC: 4.1 10*3/uL (ref 3.8–10.6)

## 2017-11-24 LAB — COMPREHENSIVE METABOLIC PANEL
ALK PHOS: 77 U/L (ref 38–126)
ALT: 28 U/L (ref 0–44)
AST: 40 U/L (ref 15–41)
Albumin: 3.4 g/dL — ABNORMAL LOW (ref 3.5–5.0)
Anion gap: 9 (ref 5–15)
BUN: 19 mg/dL (ref 8–23)
CALCIUM: 8.9 mg/dL (ref 8.9–10.3)
CO2: 28 mmol/L (ref 22–32)
Chloride: 105 mmol/L (ref 98–111)
Creatinine, Ser: 1.12 mg/dL (ref 0.61–1.24)
Glucose, Bld: 149 mg/dL — ABNORMAL HIGH (ref 70–99)
Potassium: 3.1 mmol/L — ABNORMAL LOW (ref 3.5–5.1)
SODIUM: 142 mmol/L (ref 135–145)
Total Bilirubin: 0.9 mg/dL (ref 0.3–1.2)
Total Protein: 7.2 g/dL (ref 6.5–8.1)

## 2017-11-24 LAB — PROTIME-INR
INR: 2.22
Prothrombin Time: 24.4 seconds — ABNORMAL HIGH (ref 11.4–15.2)

## 2017-11-24 LAB — LIPASE, BLOOD: Lipase: 21 U/L (ref 11–51)

## 2017-11-24 MED ORDER — KETOROLAC TROMETHAMINE 30 MG/ML IJ SOLN: 30.0000 mg | Freq: Once | INTRAMUSCULAR | Status: DC

## 2017-11-24 MED ORDER — KETOROLAC TROMETHAMINE 30 MG/ML IJ SOLN
30.0000 mg | Freq: Once | INTRAMUSCULAR | Status: AC
  Administered 2017-11-24: 30 mg via INTRAMUSCULAR

## 2017-11-24 MED ORDER — CARISOPRODOL 350 MG PO TABS: 350.0000 mg | ORAL_TABLET | Freq: Three times a day (TID) | ORAL | 0 refills | Status: DC | PRN

## 2017-11-24 MED ORDER — DICLOFENAC SODIUM 1 % TD GEL: TRANSDERMAL | 0 refills | Status: DC

## 2017-11-24 MED ORDER — KETOROLAC TROMETHAMINE 30 MG/ML IJ SOLN
INTRAMUSCULAR | Status: AC
  Administered 2017-11-24: 30 mg via INTRAMUSCULAR
  Filled 2017-11-24: qty 1

## 2017-11-24 NOTE — ED Notes (Signed)
E-signature pad not working. Pt verbalized understanding of follow up and discharge information.

## 2017-11-24 NOTE — ED Triage Notes (Signed)
Pt arrives with complaints of lower left abdominal pain that radiates to his lower back. Pt states the pain started yesterday and increases in intensity with movement. Pt describes the pain as a sharp sensation.

## 2017-11-24 NOTE — ED Provider Notes (Signed)
[facility] Emergency Department Provider Note ____________________________________________   First MD Initiated Contact with Patient 11/24/17 1834     (approximate)  I have reviewed the triage vital signs and the nursing notes.  HISTORY  Chief Complaint Abdominal Pain   HPI [name] is a 67 y.o. male with a history of atrial fibrillation, cocaine use as well as being on Coumadin who was presented to the emergency department right flank pain.  He says the pain is been ongoing since yesterday.  He says that he has been lifting heavy pallets lately for a "side project."  He says that when he is still the pain is gone but when he moves he has a 9-10 out of 10 right sided cramping pain to his flank.  He denies any nausea vomiting or diarrhea.  Denies any burning with urination or frequency with urination.  Denies any history of kidney stones.  Says that he still taking his Coumadin.  Patient said that he took ibuprofen last this morning without relief.  Past Medical History:  Diagnosis Date  . Atrial fibrillation ([place])   . Cocaine abuse ([place])    + UDS on admission  . Gout current and history of  . History of deep vein thrombosis 2006  . History of tobacco abuse    has smoked for 50 years  . Hypertension   . Pulmonary embolism ([place])   . Stroke [facility])     Patient Active Problem List   Diagnosis Date Noted  . Arthritis 05/12/2017  . Varicose veins of leg with swelling, bilateral 01/18/2017  . Facial twitching   . Cerebral infarction ([place]) 10/03/2016  . 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 Pneumonia 10/12/2014  . Osteoarthritis of right knee 02/11/2014  . Pain and swelling of knee 01/08/2014  . Pain in extremity 01/08/2014  . Weakness due to cerebrovascular accident 01/08/2014  . Pulmonary embolism ([place]) 06/19/2012  . Gout 09/22/2011  . 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 CVA (cerebral  infarction) 04/26/2011  . Diabetes mellitus ([place]) 04/26/2011  . 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 Anemia 04/26/2011  . DVT (deep venous thrombosis) ([place]) 04/26/2011  . Essential (primary) hypertension 04/26/2011    Past Surgical History:  Procedure Laterality Date  . VASCULAR SURGERY     Stent in leg    Prior to Admission medications   Medication Sig Start Date End Date Taking? Authorizing Provider  aspirin 81 MG tablet Take 81 mg by mouth daily.    [provider]  colchicine 0.6 MG tablet Take 0.6 mg by mouth daily.     [provider]  colchicine 0.6 MG tablet Take 2 tablets (1.2mg ) by mouth at first sign of gout flare followed by 1 tablet (0.6mg ) after 1 hour. (Max 1.8mg  within 1 hour).  Cut back if diarrhea develops. 11/14/12   [provider]  Ferrous Sulfate (IRON) 28 MG TABS Take 1 tablet by mouth daily.    [provider]  furosemide (LASIX) 40 MG tablet Take 40 mg by mouth daily.  08/05/15   [provider]  guaiFENesin-dextromethorphan (ROBITUSSIN DM) 100-10 MG/5ML syrup Take 5 mLs by mouth every 4 (four) hours as needed for cough. Patient not taking: Reported on 07/20/2017 03/04/17   Fritzi Mandes, MD  HYDROcodone-acetaminophen [facility]) 5-325 MG tablet Take 1 tablet by mouth every 4 (four) hours as needed for moderate pain. Patient not taking: Reported on 10/03/2016 07/18/16  Merlyn Lot, MD  ibuprofen (ADVIL,MOTRIN) 200 MG tablet Take 600 mg by mouth daily.     [provider]  lactulose (CHRONULAC) 10 GM/15ML solution Take 30 mLs (20 g total) by mouth daily as needed for mild constipation. Patient not taking: Reported on 10/03/2016 05/11/15   Paulette Blanch, MD  metFORMIN (GLUCOPHAGE) 500 MG tablet Take 500 mg by mouth 2 (two) times daily with a meal.  04/12/17   [provider]  metoprolol tartrate (LOPRESSOR) 25 MG tablet Take 25 mg by mouth 2 (two) times daily.    [provider]  nutrition  supplement, JUVEN, (JUVEN) PACK Take 1 packet by mouth 2 (two) times daily between meals. Patient not taking: Reported on 07/20/2017 10/05/16   Demetrios Loll, MD  polyethylene glycol [facility]) packet Take 17 g by mouth daily. Patient not taking: Reported on 03/03/2017 05/14/15   Loney Hering, MD  warfarin (COUMADIN) 1 MG tablet Take 2 mg by mouth daily. Take along with 6 mg tablet daily to total 8 mg.    [provider]  warfarin (COUMADIN) 6 MG tablet Take 6 mg by mouth daily. Take along with 2 mg (1 mg tablets) dose daily to total 8 mg.    [provider]    Allergies Patient has no known allergies.  Family History  Problem Relation Age of Onset  . Aneurysm Mother   . Diabetes Father     Social History Social History   Tobacco Use  . Smoking status: Current Every Day Smoker    Packs/day: 0.50    Years: 50.00    Pack years: 25.00    Types: Cigarettes  . Smokeless tobacco: Never Used  Substance Use Topics  . Alcohol use: No  . Drug use: No    Comment: Urinainary drug screen + cocaine on admit    Review of Systems  Constitutional: No fever/chills Eyes: No visual changes. ENT: No sore throat. Cardiovascular: Denies chest pain. Respiratory: Denies shortness of breath. Gastrointestinal: No abdominal pain.  No nausea, no vomiting.  No diarrhea.  No constipation. Genitourinary: Negative for dysuria. Musculoskeletal: As above Skin: Negative for rash. Neurological: Negative for headaches, focal weakness or numbness.   ____________________________________________   PHYSICAL EXAM:  VITAL SIGNS: ED Triage Vitals  Enc Vitals Group     BP 11/24/17 1759 (!) 186/90     Pulse Rate 11/24/17 1759 (!) 57     Resp 11/24/17 1759 14     Temp 11/24/17 1759 98.6 F (37 C)     Temp Source 11/24/17 1759 Oral     SpO2 11/24/17 1759 97 %     Weight 11/24/17 1759 225 lb (102.1 kg)     Height 11/24/17 1759 6\' 2"  (1.88 m)     Head Circumference --      Peak Flow --       Pain Score 11/24/17 1801 8     Pain Loc --      Pain Edu? --      Excl. in [place]? --     Constitutional: Alert and oriented. Well appearing and in no acute distress. Eyes: Conjunctivae are normal.  Head: Atraumatic. Nose: No congestion/rhinnorhea. Mouth/Throat: Mucous membranes are moist.  Neck: No stridor.   Cardiovascular: Normal rate, regular rhythm. Grossly normal heart sounds.   Respiratory: Normal respiratory effort.  No retractions. Lungs CTAB. Gastrointestinal: Soft and nontender to all 4 quadrants.  However, there is tenderness to palpation over the right lateral flank to the  soft tissue. No distention. No CVA tenderness. Musculoskeletal: No lower extremity tenderness nor edema.  No joint effusions. Neurologic:  Normal speech and language. No gross focal neurologic deficits are appreciated. Skin:  Skin is warm, dry and intact. No rash noted.  No hematoma, ecchymosis, crepitus to the right flank. Psychiatric: Mood and affect are normal. Speech and behavior are normal.  ____________________________________________   LABS (all labs ordered are listed, but only abnormal results are displayed)  Labs Reviewed  COMPREHENSIVE METABOLIC PANEL - Abnormal; Notable for the following components:      Result Value   Potassium 3.1 (*)    Glucose, Bld 149 (*)    Albumin 3.4 (*)    All other components within normal limits  CBC - Abnormal; Notable for the following components:   Hemoglobin 11.6 (*)    HCT 35.6 (*)    MCV 77.5 (*)    MCH 25.2 (*)    RDW 16.5 (*)    Platelets 142 (*)    All other components within normal limits  LIPASE, BLOOD  URINALYSIS, COMPLETE (UACMP) WITH MICROSCOPIC   ____________________________________________  EKG   ____________________________________________  RADIOLOGY   ____________________________________________   PROCEDURES  Procedure(s) performed:   Procedures  Critical Care performed:    ____________________________________________   INITIAL IMPRESSION / ASSESSMENT AND PLAN / ED COURSE  Pertinent labs & imaging results that were available during my care of the patient were reviewed by me and considered in my medical decision making (see chart for details).  DDX: Muscle strain, kidney stone, UTI, muscle spasm As part of my medical decision making, I reviewed the following data within the [facility] Notes from prior ED visits  ----------------------------------------- 7:59 PM on 11/24/2017 -----------------------------------------  Patient with an INR of 2.22.  We will give a Toradol shot and discharge with a muscle relaxer as well as diclofenac gel.  Unlikely to be spontaneous internal bleeding.  Hemoglobin appears to be at baseline.  No signs of trauma.  Likely muscle strain.  Patient understand the diagnosis as well as treatment and willing to comply.  Also without any urinary complaints. ____________________________________________   FINAL CLINICAL IMPRESSION(S) / ED DIAGNOSES  Flank pain.  Musculoskeletal pain.   NEW MEDICATIONS STARTED DURING THIS VISIT:  New Prescriptions   No medications on file     Note:  This document was prepared using Dragon voice recognition software and may include unintentional dictation errors.     Orbie Pyo, MD 11/24/17 [phone]

## 2017-11-29 ENCOUNTER — Encounter: Payer: Medicare Other | Admitting: Physician Assistant

## 2017-11-29 ENCOUNTER — Other Ambulatory Visit
Admission: RE | Admit: 2017-11-29 | Discharge: 2017-11-29 | Disposition: A | Discharge status: Home / Self Care | Payer: Medicare Other | Source: Ambulatory Visit | Attending: Physician Assistant | Admitting: Physician Assistant

## 2017-11-29 DIAGNOSIS — B999 Unspecified infectious disease: Secondary | ICD-10-CM | POA: Diagnosis present

## 2017-11-29 DIAGNOSIS — E11622 Type 2 diabetes mellitus with other skin ulcer: Secondary | ICD-10-CM | POA: Diagnosis not present

## 2017-12-01 DIAGNOSIS — E11622 Type 2 diabetes mellitus with other skin ulcer: Secondary | ICD-10-CM | POA: Diagnosis not present

## 2017-12-01 LAB — AEROBIC CULTURE W GRAM STAIN (SUPERFICIAL SPECIMEN)

## 2017-12-01 LAB — AEROBIC CULTURE  (SUPERFICIAL SPECIMEN)

## 2017-12-01 NOTE — Progress Notes (Signed)
[facility] ([id number]) Visit Report for 11/29/2017 Arrival Information Details Patient Name: [name], [name] Date of Service: 11/29/2017 9:00 AM Medical Record Number: [id number] Patient Account Number: [id number] Date of Birth/Sex: Apr 17, 1951 (67 y.o. M) Treating RN: Montey Hora Primary Care Wilkes Potvin: Royetta Crochet Other Clinician: Referring Wing Schoch: Royetta Crochet Treating Denesha Brouse/Extender: Melburn Hake, HOYT Weeks in Treatment: 5 Visit Information History Since Last Visit Added or deleted any medications: No Patient Arrived: Ambulatory Any new allergies or adverse reactions: No Arrival Time: 09:08 Had a fall or experienced change in No Accompanied By: self activities of daily living that may affect Transfer Assistance: None risk of falls: Patient Identification Verified: Yes Signs or symptoms of abuse/neglect since last visito No Secondary Verification Process Yes Hospitalized since last visit: No Completed: Implantable device outside of the clinic excluding No Patient Has Alerts: Yes cellular tissue based products placed in the center Patient Alerts: Patient on Blood since last visit: Thinner Has Dressing in Place as Prescribed: Yes DMII Has Compression in Place as Prescribed: Yes warfarin Pain Present Now: Yes Electronic Signature(s) Signed: 11/29/2017 5:46:04 PM By: Montey Hora Entered By: Montey Hora on 11/29/2017 09:10:31 Kiss, [name] ([id number]) -------------------------------------------------------------------------------- Encounter Discharge Information Details Patient Name: [name] Date of Service: 11/29/2017 9:00 AM Medical Record Number: [id number] Patient Account Number: [id number] Date of Birth/Sex: [birth year] (67 y.o. M) Treating RN: Roger Shelter Primary Care Arian Murley: Royetta Crochet Other Clinician: Referring Nealy Hickmon: Royetta Crochet Treating Isabelly Kobler/Extender: Melburn Hake, HOYT Weeks in Treatment: 5 Encounter  Discharge Information Items Discharge Condition: Stable Ambulatory Status: Ambulatory Discharge Destination: Home Transportation: Private Auto Schedule Follow-up Appointment: Yes Clinical Summary of Care: Electronic Signature(s) Signed: 11/29/2017 4:58:15 PM By: Roger Shelter Entered By: Roger Shelter on 11/29/2017 10:00:42 Izard, [name] ([id number]) -------------------------------------------------------------------------------- Lower Extremity Assessment Details Patient Name: [name] Date of Service: 11/29/2017 9:00 AM Medical Record Number: [id number] Patient Account Number: [id number] Date of Birth/Sex: 07/11/1950 (67 y.o. M) Treating RN: Montey Hora Primary Care Cassondra Stachowski: Royetta Crochet Other Clinician: Referring Leona Alen: Royetta Crochet Treating Anastasios Melander/Extender: Melburn Hake, HOYT Weeks in Treatment: 5 Edema Assessment Assessed: [Left: No] [Right: No] E[Left: dema] [Right: :] Calf Left: Right: Point of Measurement: 40 cm From Medial Instep cm 42.7 cm Ankle Left: Right: Point of Measurement: 14 cm From Medial Instep cm 29 cm Vascular Assessment Pulses: Dorsalis Pedis Palpable: [Right:Yes] Posterior Tibial Extremity colors, hair growth, and conditions: Extremity Color: [Right:Hyperpigmented] Hair Growth on Extremity: [Right:No] Temperature of Extremity: [Right:Warm] Capillary Refill: [Right:< 3 seconds] Toe Nail Assessment Left: Right: Thick: Yes Discolored: Yes Deformed: Yes Improper Length and Hygiene: No Electronic Signature(s) Signed: 11/29/2017 5:46:04 PM By: Montey Hora Entered By: Montey Hora on 11/29/2017 09:15:22 Zapf, [name] ([id number]) -------------------------------------------------------------------------------- Multi Wound Chart Details Patient Name: [name] Date of Service: 11/29/2017 9:00 AM Medical Record Number: [id number] Patient Account Number: [id number] Date of Birth/Sex: 28-May-1950 (67 y.o.  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] [N/A:N/A] Secondary Etiology: [8:Lymphedema] [N/A:N/A] Comorbid History: [8:Chronic Obstructive Pulmonary Disease (COPD), Arrhythmia, Deep Vein Thrombosis, Hypertension, Type II Diabetes, Gout, Neuropathy] [N/A:N/A] Date Acquired: [8:10/17/2017] [N/A:N/A] Weeks of Treatment: [8:5] [N/A:N/A] Wound Status: [8:Open] [N/A:N/A] Measurements L x W x  D [8:5x4.5x0.2] [N/A:N/A] (cm) Area (cm) : [8:17.671] [N/A:N/A] Volume (cm) : [8:3.534] [N/A:N/A] % Reduction in Area: [8:-10609.70%] [N/A:N/A] % Reduction in Volume: [8:-21987.50%] [N/A:N/A] Classification: [8:Grade 1] [N/A:N/A] Exudate Amount: [8:Large] [N/A:N/A] Exudate Type: [8:Serosanguineous] [N/A:N/A] Exudate Color: [8:red, brown] [N/A:N/A] Foul Odor After Cleansing: [8:Yes] [N/A:N/A] Odor Anticipated Due to [8:No] [N/A:N/A] Product Use: Wound Margin: [8:Flat and Intact] [N/A:N/A] Granulation Amount: [8:Medium (34-66%)] [N/A:N/A] Granulation Quality: [8:Pink] [N/A:N/A] Necrotic Amount: [8:Medium (34-66%)] [N/A:N/A] Exposed Structures: [8:Fat Layer (Subcutaneous Tissue) Exposed: Yes Fascia: No Tendon: No] [N/A:N/A] Muscle: No Joint: No Bone: No Epithelialization: Medium (34-66%) N/A N/A Periwound Skin Texture: Excoriation: No N/A N/A Induration: No Callus: No Crepitus: No Rash: No Scarring: No Periwound Skin Moisture: Maceration: Yes N/A N/A Dry/Scaly:  No Periwound Skin Color: Atrophie Blanche: No N/A N/A Cyanosis: No Ecchymosis: No Erythema: No Hemosiderin Staining: No Mottled: No Pallor: No Rubor: No Temperature: No Abnormality N/A N/A Tenderness on Palpation: Yes N/A N/A Wound Preparation: Ulcer Cleansing: N/A N/A Rinsed/Irrigated with Saline, Other: soap and water Topical Anesthetic Applied: Other: lidocaine 4% Treatment Notes Electronic Signature(s) Signed: 11/30/2017 5:07:56 PM By: Alric Quan Entered By: Alric Quan on 11/29/2017 09:37:12 Siegenthaler, [name] ([id number]) -------------------------------------------------------------------------------- Multi-Disciplinary Care Plan Details Patient Name: [name] Date of Service: 11/29/2017 9:00 AM Medical Record Number: [id number] Patient Account Number: [id number] Date of Birth/Sex: Aug 25, 1950 (66 y.o. M) Treating RN: Ahmed Prima Primary Care Raymir Frommelt: Royetta Crochet Other Clinician: Referring Jailyn Langhorst: Royetta Crochet Treating Cydney Alvarenga/Extender: Melburn Hake, HOYT Weeks in Treatment: 5 Active Inactive ` Abuse / Safety / Falls / Self Care Management Nursing Diagnoses: Potential for falls Goals: Patient will not experience any injury related to falls Date Initiated: 10/25/2017 Target Resolution Date: 02/18/2018 Goal Status: Active Interventions: Assess Activities of Daily Living upon admission and as needed Assess fall risk on admission and as needed Assess: immobility, friction, shearing, incontinence upon admission and as needed Assess impairment of mobility on admission and as needed per policy Assess personal safety and home safety (as indicated) on admission and as needed Assess self care needs on admission and as needed Notes: ` Nutrition Nursing Diagnoses: Imbalanced nutrition Impaired glucose control: actual or potential Potential for alteratiion in Nutrition/Potential for imbalanced nutrition Goals: Patient/caregiver agrees to and  verbalizes understanding of need to use nutritional supplements and/or vitamins as prescribed Date Initiated: 10/25/2017 Target Resolution Date: 02/18/2018 Goal Status: Active Patient/caregiver will maintain therapeutic glucose control Date Initiated: 10/25/2017 Target Resolution Date: 02/18/2018 Goal Status: Active Interventions: Assess patient nutrition upon admission and as needed per policy Provide education on elevated blood sugars and impact on wound healing Provide education on nutrition BARUCH, LEWERS ([id number]) Notes: ` Orientation to the Wound Care Program Nursing Diagnoses: Knowledge deficit related to the wound healing center program Goals: Patient/caregiver will verbalize understanding of the [place] Program Date Initiated: 10/25/2017 Target Resolution Date: 11/19/2017 Goal Status: Active Interventions: Provide education on orientation to the wound center Notes: ` Wound/Skin Impairment Nursing Diagnoses: Impaired tissue integrity Knowledge deficit related to ulceration/compromised skin integrity Goals: Ulcer/skin breakdown will have a volume reduction of 80% by week 12 Date Initiated: 10/25/2017 Target Resolution Date: 02/11/2018 Goal Status: Active Interventions: Assess patient/caregiver ability to perform ulcer/skin care regimen upon admission and as needed Assess ulceration(s) every visit Notes: Electronic Signature(s) Signed: 11/30/2017 5:07:56 PM By: Alric Quan Entered By: Alric Quan on 11/29/2017 09:37:00 Gertsch, [name] ([id number]) -------------------------------------------------------------------------------- Pain Assessment Details Patient Name: [name] Date of Service: 11/29/2017 9:00 AM Medical Record Number: [id number] Patient Account Number: [id number] Date of Birth/Sex:  1950-11-03 (67 y.o. M) Treating RN: Montey Hora Primary Care Keili Hasten: Royetta Crochet Other Clinician: Referring Bryar Dahms: Royetta Crochet Treating Rendi Mapel/Extender: STONE III, HOYT Weeks in Treatment: 5 Active Problems Location of Pain Severity and Description of Pain Patient Has Paino Yes Site Locations Pain Location: Pain in Ulcers With Dressing Change: Yes Duration of the Pain. Constant / Intermittento Constant Rate the pain. Current Pain Level: 9 Pain Management and Medication Current Pain Management: Electronic Signature(s) Signed: 11/29/2017 5:46:04 PM By: Montey Hora Entered By: Montey Hora on 11/29/2017 09:10:46 Whalin, [name] ([id number]) -------------------------------------------------------------------------------- Patient/Caregiver Education Details Patient Name: [name] Date of Service: 11/29/2017 9:00 AM Medical Record Number: [id number] Patient Account Number: [id number] Date of Birth/Gender: 12-26-1950 (66 y.o. M) Treating RN: Roger Shelter Primary Care Physician: Royetta Crochet Other Clinician: Referring Physician: Royetta Crochet Treating Physician/Extender: Sharalyn Ink in Treatment: 5 Education Assessment Education Provided To: Patient Education Topics Provided Wound Debridement: Handouts: Wound Debridement Methods: Explain/Verbal Responses: State content correctly Wound/Skin Impairment: Handouts: Caring for Your Ulcer Methods: Explain/Verbal Responses: State content correctly Electronic Signature(s) Signed: 11/29/2017 4:58:15 PM By: Roger Shelter Entered By: Roger Shelter on 11/29/2017 10:00:59 [place], [name] ([id number]) -------------------------------------------------------------------------------- Wound Assessment Details Patient Name: [name] Date of Service: 11/29/2017 9:00 AM Medical Record Number: [id number] Patient Account Number: [id number] Date of Birth/Sex: 03/02/1951 (66 y.o. M) Treating RN: Montey Hora Primary Care Zakyra Kukuk: Royetta Crochet Other Clinician: Referring Aydn Ferrara: Royetta Crochet Treating  Avian [place]/Extender: STONE III, HOYT Weeks in Treatment: 5 Wound Status Wound Number: 8 Primary Diabetic Wound/Ulcer of the Lower Extremity Etiology: Wound Location: Right Lower Leg - Medial, Proximal Secondary Lymphedema Wounding Event: Gradually Appeared Etiology: Date Acquired: 10/17/2017 Wound Open Weeks Of Treatment: 5 Status: Clustered Wound: No Comorbid Chronic Obstructive Pulmonary Disease History: (COPD), Arrhythmia, Deep Vein Thrombosis, Hypertension, Type II Diabetes, Gout, Neuropathy Photos Photo Uploaded By: Montey Hora on 11/29/2017 12:03:21 Wound Measurements Length: (cm) 5 Width: (cm) 4.5 Depth: (cm) 0.2 Area: (cm) 17.671 Volume: (cm) 3.534 % Reduction in Area: -10609.7% % Reduction in Volume: -21987.5% Epithelialization: Medium (34-66%) Tunneling: No Undermining: No Wound Description Classification: Grade 1 Wound Margin: Flat and Intact Exudate Amount: Large Exudate Type: Serosanguineous Exudate Color: red, brown Foul Odor After Cleansing: Yes Due to Product Use: No Slough/Fibrino Yes Wound Bed Granulation Amount: Medium (34-66%) Exposed Structure Granulation Quality: Pink Fascia Exposed: No Necrotic Amount: Medium (34-66%) Fat Layer (Subcutaneous Tissue) Exposed: Yes Necrotic Quality: Adherent Slough Tendon Exposed: No Muscle Exposed: No Joint Exposed: No Justiss, Andric ([id number]) Bone Exposed: No Periwound Skin Texture Texture Color No Abnormalities Noted: No No Abnormalities Noted: No Callus: No Atrophie Blanche: No Crepitus: No Cyanosis: No Excoriation: No Ecchymosis: No Induration: No Erythema: No Rash: No Hemosiderin Staining: No Scarring: No Mottled: No Pallor: No Moisture Rubor: No No Abnormalities Noted: No Dry / Scaly: No Temperature / Pain Maceration: Yes Temperature: No Abnormality Tenderness on Palpation: Yes Wound Preparation Ulcer Cleansing: Rinsed/Irrigated with Saline, Other: soap and  water, Topical Anesthetic Applied: Other: lidocaine 4%, Electronic Signature(s) Signed: 11/29/2017 5:46:04 PM By: Montey Hora Entered By: Montey Hora on 11/29/2017 09:18:30 [place], [name] ([id number]) -------------------------------------------------------------------------------- Vitals Details Patient Name: [name] Date of Service: 11/29/2017 9:00 AM Medical Record Number: [id number] Patient Account Number: [id number] Date of Birth/Sex: [birth year] (67 y.o. M) Treating RN: Montey Hora Primary Care Kadeisha Betsch: Royetta Crochet Other Clinician: Referring Shakiara Lukic: Royetta Crochet Treating Cornesha Radziewicz/Extender: STONE III, HOYT Weeks in Treatment: 5 Vital Signs Time Taken: 09:11 Temperature (F): 97.9 Height (in): 74 Pulse (bpm): 62 Weight (lbs): 236  Respiratory Rate (breaths/min): 16 Body Mass Index (BMI): 30.3 Blood Pressure (mmHg): 142/81 Reference Range: 80 - 120 mg / dl Electronic Signature(s) Signed: 11/29/2017 5:46:04 PM By: Montey Hora Entered By: Montey Hora on 11/29/2017 09:11:35

## 2017-12-01 NOTE — Progress Notes (Signed)
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  the saphenofemoral junction to the distal 5 level. The patient wears to let her compression stockings and he has been compliant with this With regards to his diabetes he is not currently on any treatment. He does describe pain in his leg which forces him to stop although I couldn't really quantify this. His last arterial studies were on 07/05/16 which showed triphasic waveforms on the right to the level of the popliteal artery. The right posterior tibial and anterior tibial artery were not visualized on this study. I don't actually see ABIs or TBIs. 05/04/17; the patient has small wounds on the right medial heel and the right medial leg. The area on the foot is just about closed. The area on the right medial leg is improved. He is tolerating the 3 layer compression without any complaints. The patient has both known PAD and chronic venous reflux [see discussion above]. 05/11/17; the areas on the patient's medial right heel with healed he still has an open area on the right medial leg. This does not require debridement today I think I did read it last week. We are using silver collagen under 3 layer compression. 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 He has been tolerating the dressing changes without complication. With that being said he does have some epithelialization growing into the wound bed and it appears this wound may be trying to healed in a depressed fashion. To be honest that is not the worst case scenario and I think this may definitely do fine even if it heals in that way. There was no requirement for debridement today as patient appears to be doing very well in regard to his ulcer. His swelling was a little bit more severe than last week but nothing too significant. 07/05/17 on evaluation today patient appears to be  doing rather well in regard to his right medial lower extremity ulcer. The wound does appear to be healing although it is healing and somewhat of a cratered fashion the good news is he has excellent epithelium noted. Fortunately there does not appear to be any evidence of infection at this point he is tolerating the dressings as well is the wrap well. 07/12/17 on evaluation today patient appears to be doing very well in regard to his left medial lower extremity ulcer. He has been tolerating the dressing changes without complication. In fact compared to last week this wound which was somewhat concave has fielding dramatically and looks to be doing excellent. I'm extremely happy with the progress. He still has pain rated to be a 4-5/10 but the wound looks great. 07/20/17; the patient's right medial lower extremity ulcer is closed. He has good edema control. Unfortunately he does not really have  adequate compression stockings. 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 He has been treated with compression stockings for some time now in fact we have notes having treated him even as far back as 2016 here in our clinic. Fortunately he has excellent blood flow and tends to heal well. Unfortunately despite wearing his compression stockings, keeping his legs elevated, being on fluid pills (Lasix) and doing everything he can to try to help with fluid he still continues to have issues with this reopening. I think that he may be a good candidate for compression/lymphedema pumps. We have never attempted to get these for him previously. No fevers, chills, nausea, or vomiting noted at this time.  Currently patient states his ulcer does hurt although not as bad as some in the past it's also not as large as it has been in the past when it reopened he talked is somewhat early. Obviously this is good news. TOMMEY, BARRET ([id number]) 11/01/17 on evaluation today patient appears to be doing better in regard to his lower extremity ulcer. He has been tolerating the wraps without complication. Overall things seem to be showing signs of good improvement which is excellent. That's what were looking for obviously. He has always done well with the wraps in the past as well. Fortunately there does not appear to be any evidence of infection at this point in time. 11/08/17 on evaluation today patient appears to be doing rather okay in regard to his right medial malleolus ulcer. With that being said he still seems to have some fluid control issues at this point. With that being said I think we may want to switch from the silvercell to maybe Prisma to try to help this and prove more significantly as far as the moisture control is concerned. Overall I do think she's made some progress and I believe that we may be able to aid him along with just a few minor changes. His swelling seems to be doing well with the wrap. 11/15/17 on evaluation today patient's right medial malleolus ulcer appears to be very moist as far as drainage is concerned. It does appear that the Drawtex was not as beneficial for him as we would've liked. He fortunately has no evidence of infection at this time unfortunately again he did have a lot of maceration noted at this point. His wound was a little bit larger. Otherwise there is no evidence of active infection at this point. 11/22/17 on evaluation today patient's wound actually is a little bit larger and more macerated today compared to previous. The dressings that were applying do seem to be helping to trap the fluid nonetheless the wound is a little bit larger and has spread. With  that being said there still slough in the surface of the wound he's had a lot of increased pain therefore I'm trying to avoid sharp debridement today at the patient request. 11/29/17 on evaluation today patient appears to be doing a little better in my pinion in regard to his right medial ankle ulcer. Nonetheless this continues to be very macerated. We have been performing at one time a week dressing change I think this may not be benefiting him I think it  may be possible that he would benefit from more frequent dressing changes. At least another rat change once during the week. Nonetheless he is not opposed to this and I think we should consider setting him up with home health. Patient History Information obtained from Patient. Family History Heart Disease - Mother,Father, Hypertension - Mother,Father, Stroke - Father, No family history of Cancer, Diabetes, Hereditary Spherocytosis, Thyroid Problems, Tuberculosis. Social History Current every day smoker, Marital Status - Single, Alcohol Use - Never, Drug Use - No History, Caffeine Use - Daily. Medical History Hospitalization/Surgery History - 05/17/2012, [facility], CVA. - 03/17/2017, [facility], Pneumonia. Medical And Surgical History Notes Ear/Nose/Mouth/Throat difficulty speaking Respiratory Hospitalized for Pneumonia 02/2017 Neurologic CVA in 2014 Review of Systems (ROS) Constitutional Symptoms (General Health) Denies complaints or symptoms of Fever, Chills. Respiratory The patient has no complaints or symptoms. Cardiovascular Complains or has symptoms of LE edema. Psychiatric The patient has no complaints or symptoms. Yoshimura, [name] ([id number]) Objective Constitutional Well-nourished and well-hydrated in no acute distress. Vitals Time Taken: 9:11 AM, Height: 74 in, Weight: 236 lbs, BMI: 30.3, Temperature: 97.9 F, Pulse: 62 bpm, Respiratory Rate: 16 breaths/min, Blood Pressure: 142/81 mmHg. Respiratory normal breathing without  difficulty. clear to auscultation bilaterally. Cardiovascular regular rate and rhythm with normal S1, S2. 1+ pitting edema of the bilateral lower extremities. Psychiatric this patient is able to make decisions and demonstrates good insight into disease process. Alert and Oriented x 3. pleasant and cooperative. General Notes: On evaluation today patient's wound actually show signs of significant improvement with new epithelialization and I do believe the Iodoflex as help to clean things up quite a bit. With that being said I still believe he is having a lot of maceration despite everything that we've done to counter this. My suggestion is gonna be adding a rapid change on Fridays so we will change it on Tuesday and then he would have home health change it on Friday. No debridement performed today as he's continue to have significant discomfort and really did not want me to perform any sharp debridement. A wound culture was obtained. Integumentary (Hair, Skin) Wound #8 status is Open. Original cause of wound was Gradually Appeared. The wound is located on the Right,Proximal,Medial Lower Leg. The wound measures 5cm length x 4.5cm width x 0.2cm depth; 17.671cm^2 area and 3.534cm^3 volume. There is Fat Layer (Subcutaneous Tissue) Exposed exposed. There is no tunneling or undermining noted. There is a large amount of serosanguineous drainage noted. Foul odor after cleansing was noted. The wound margin is flat and intact. There is medium (34-66%) pink granulation within the wound bed. There is a medium (34-66%) amount of necrotic tissue within the wound bed including Adherent Slough. The periwound skin appearance exhibited: Maceration. The periwound skin appearance did not exhibit: Callus, Crepitus, Excoriation, Induration, Rash, Scarring, Dry/Scaly, Atrophie Blanche, Cyanosis, Ecchymosis, Hemosiderin Staining, Mottled, Pallor, Rubor, Erythema. Periwound temperature was noted as No Abnormality. The  periwound has tenderness on palpation. Assessment Active Problems ICD-10 Venous insufficiency (chronic) (peripheral) Lymphedema, not elsewhere classified Type 2 diabetes mellitus with other skin ulcer Merriweather, Salvadore ([id number]) Non-pressure chronic ulcer of other part of right lower leg with fat layer exposed Plan Wound Cleansing: Wound #8 Right,Proximal,Medial Lower Leg: Clean wound with Normal Saline. Cleanse wound with mild soap and water Anesthetic (add to Medication List): Wound #8 Right,Proximal,Medial Lower Leg: Topical Lidocaine 4% cream applied to wound bed prior to debridement (In Clinic Only). Skin Barriers/Peri-Wound Care: Wound #8 Right,Proximal,Medial Lower Leg: Moisturizing lotion - not on wound  area Primary Wound Dressing: Wound #8 Right,Proximal,Medial Lower Leg: Iodoflex Secondary Dressing: Wound #8 Right,Proximal,Medial Lower Leg: ABD pad XtraSorb Dressing Change Frequency: Wound #8 Right,Proximal,Medial Lower Leg: Other: - HHRN to change on Friday and pt will come to [facility] on Tuesdays Follow-up Appointments: Wound #8 Right,Proximal,Medial Lower Leg: Return Appointment in 1 week. Edema Control: Wound #8 Right,Proximal,Medial Lower Leg: 3 Layer Compression System - Right Lower Extremity - unna to anchor Other: - order bilateral compression pumps Additional Orders / Instructions: Wound #8 Right,Proximal,Medial Lower Leg: Increase protein intake. Home Health: Wound #8 Right,Proximal,Medial Lower Leg: [place] for [place] Nurse may visit [facility] to address patient s wound care needs. FACE TO FACE ENCOUNTER: MEDICARE and MEDICAID PATIENTS: I certify that this patient is under my care and that I had a face-to-face encounter that meets the physician face-to-face encounter requirements with this patient on this date. The encounter with the patient was in whole or in part for the  following MEDICAL CONDITION: (primary reason for [place]) MEDICAL NECESSITY: I certify, that based on my findings, NURSING services are a medically necessary home health service. HOME BOUND STATUS: I certify that my clinical findings support that this patient is homebound (i.e., Due to illness or injury, pt requires aid of supportive devices such as crutches, cane, wheelchairs, walkers, the use of special transportation or the assistance of another person to leave their place of residence. There is a normal inability to leave the home and doing so requires considerable and taxing effort. Other absences are for medical reasons / religious services and are infrequent or of short duration when for other reasons). If current dressing causes regression in wound condition, may D/C ordered dressing product/s and apply Normal Saline Moist Dressing daily until next [place] / Other MD appointment. [place] of regression in wound condition at [phone]. Please direct any NON-WOUND related issues/requests for orders to patient's Primary Care Physician Laboratory ordered were: Wound culture routine Steinhilber, Daryl ([id number]) The following medication(s) was prescribed: lidocaine topical 4 % cream 1 1 cream topical was prescribed at facility At this point I'm gonna suggest that he continue to take his antibiotic hopefully things are improving in this regard. We will subsequently see were things stand at follow-up in one weeks time. Again we can order from [facility] to come out on Fridays to perform a midweek dressing change so that the wrap does not have to stay on the whole time. I think this may be extremely beneficial for the patient. He's in agreement with this plan. If anything changes he will contact the office and let us know. Please see above for specific wound care orders. We will see patient for re-evaluation in 1 week(s) here in the clinic. If anything  worsens or changes patient will contact our office for additional recommendations. Electronic Signature(s) Signed: 11/30/2017 1:39:21 PM By: Worthy Keeler PA-C Entered By: Worthy Keeler on 11/29/2017 09:46:41 Canche, [name] ([id number]) -------------------------------------------------------------------------------- ROS/PFSH Details Patient Name: [name] Date of Service: 11/29/2017 9:00 AM Medical Record Number: [id number] Patient Account Number: [id number] Date of Birth/Sex: 04-23-1951 (66 y.o. M) Treating RN: Ahmed Prima Primary Care Provider: Royetta Crochet Other Clinician: Referring Provider: Royetta Crochet Treating Provider/Extender: Melburn Hake, Quina Wilbourne Weeks in Treatment: 5 Information Obtained From Patient Wound History Do you currently have one or more open woundso Yes How many open wounds do you currently haveo 1 Approximately how long have you had your  woundso 1 week How have you been treating your wound(s) until nowo lotion Has your wound(s) ever healed and then re-openedo Yes Have you had any lab work done in the past montho No Have you tested positive for an antibiotic resistant organism (MRSA, VRE)o No Have you tested positive for osteomyelitis (bone infection)o No Have you had any tests for circulation on your legso Yes Who ordered the testo [facility] Where was the test doneo [facility] Constitutional Symptoms (General Health) Complaints and Symptoms: Negative for: Fever; Chills Cardiovascular Complaints and Symptoms: Positive for: LE edema Medical History: Positive for: Arrhythmia - a fib; Deep Vein Thrombosis; Hypertension Negative for: Angina; Congestive Heart Failure; Coronary Artery Disease; Hypotension; Myocardial Infarction; Peripheral Arterial Disease; Peripheral Venous Disease; Phlebitis; Vasculitis Eyes Medical History: Negative for: Cataracts; Glaucoma; Optic Neuritis Ear/Nose/Mouth/Throat Medical History: Negative for: Chronic sinus  problems/congestion; Middle ear problems Past Medical History Notes: difficulty speaking Hematologic/Lymphatic Medical History: Negative for: Anemia; Hemophilia; Human Immunodeficiency Virus; Lymphedema; Sickle Cell Disease Respiratory Esters, Ethelbert ([id number]) Complaints and Symptoms: No Complaints or Symptoms Medical History: Positive for: Chronic Obstructive Pulmonary Disease (COPD) Negative for: Aspiration; Asthma; Pneumothorax; Sleep Apnea; Tuberculosis Past Medical History Notes: Hospitalized for Pneumonia 02/2017 Gastrointestinal Medical History: Negative for: Cirrhosis ; Colitis; Crohnos; Hepatitis A; Hepatitis B; Hepatitis C Endocrine Medical History: Positive for: Type II Diabetes Negative for: Type I Diabetes Time with diabetes: since 2017 Treated with: Diet Blood sugar tested every day: No Genitourinary Medical History: Negative for: End Stage Renal Disease Immunological Medical History: Negative for: Lupus Erythematosus; Raynaudos; Scleroderma Integumentary (Skin) Medical History: Negative for: History of Burn; History of pressure wounds Musculoskeletal Medical History: Positive for: Gout Negative for: Rheumatoid Arthritis; Osteoarthritis; Osteomyelitis Neurologic Medical History: Positive for: Neuropathy Negative for: Dementia; Quadriplegia; Paraplegia; Seizure Disorder Past Medical History Notes: CVA in 2014 Oncologic Medical History: Negative for: Received Chemotherapy; Received Radiation Psychiatric JEZIAH, KRETSCHMER ([id number]) Complaints and Symptoms: No Complaints or Symptoms Medical History: Negative for: Anorexia/bulimia; Confinement Anxiety Immunizations Pneumococcal Vaccine: Received Pneumococcal Vaccination: Yes Immunization Notes: up to date Implantable Devices Hospitalization / Surgery History Name of Hospital Purpose of Hospitalization/Surgery Date [facility] CVA 05/17/2012 [place] Pneumonia 03/17/2017 Family and Social  History Cancer: No; Diabetes: No; Heart Disease: Yes - Mother,Father; Hereditary Spherocytosis: No; Hypertension: Yes - Mother,Father; Stroke: Yes - Father; Thyroid Problems: No; Tuberculosis: No; Current every day smoker; Marital Status - Single; Alcohol Use: Never; Drug Use: No History; Caffeine Use: Daily; Financial Concerns: No; Food, Clothing or Shelter Needs: No; Support System Lacking: No; Transportation Concerns: No; Advanced Directives: No; Patient does not want information on Advanced Directives Physician Affirmation I have reviewed and agree with the above information. Electronic Signature(s) Signed: 11/30/2017 1:39:21 PM By: Worthy Keeler PA-C Signed: 11/30/2017 5:07:56 PM By: Alric Quan Entered By: Worthy Keeler on 11/29/2017 09:45:36 Roeper, [name] ([id number]) -------------------------------------------------------------------------------- SuperBill Details Patient Name: [name] Date of Service: 11/29/2017 Medical Record Number: [id number] Patient Account Number: [id number] Date of Birth/Sex: [birth year] (66 y.o. M) Treating RN: Ahmed Prima Primary Care Provider: Royetta Crochet Other Clinician: Referring Provider: Royetta Crochet Treating Provider/Extender: Melburn Hake, Roy Tokarz Weeks in Treatment: 5 Diagnosis Coding ICD-10 Codes Code Description I87.2 Venous insufficiency (chronic) (peripheral) I89.0 Lymphedema, not elsewhere classified E11.622 Type 2 diabetes mellitus with other skin ulcer L97.812 Non-pressure chronic ulcer of other part of right lower leg with fat layer exposed Facility Procedures CPT4 Code Description: [id number] (Facility Use Only) [phone] - APPLY MULTLAY COMPRS LWR RT LEG Modifier: Quantity: 1 Physician Procedures CPT4 Code Description: [id number]  [id number] - WC PHYS LEVEL 4 - EST PT ICD-10 Diagnosis Description I87.2 Venous insufficiency (chronic) (peripheral) I89.0 Lymphedema, not elsewhere classified E11.622 Type 2 diabetes mellitus  with other skin ulcer L97.812  Non-pressure chronic ulcer of other part of right lower leg wi Modifier: th fat layer expos Quantity: 1 ed Electronic Signature(s) Signed: 11/29/2017 12:51:19 PM By: Alric Quan Signed: 11/30/2017 1:39:21 PM By: Worthy Keeler PA-C Entered By: Alric Quan on 11/29/2017 12:51:18

## 2017-12-02 NOTE — Progress Notes (Signed)
JABARRI, [name] ([id number]) Visit Report for 12/01/2017 Arrival Information Details Patient Name: ARAGON, SCARANTINO Date of Service: 12/01/2017 8:45 AM Medical Record Number: [id number] Patient Account Number: [id number] Date of Birth/Sex: September 13, 1950 (67 y.o. M) Treating RN: Roger Shelter Primary Care Ladale Sherburn: Royetta Crochet Other Clinician: Referring Laroy Mustard: Royetta Crochet Treating Brisha Mccabe/Extender: Cathie Olden in Treatment: 5 Visit Information History Since Last Visit All ordered tests and consults were completed: No Patient Arrived: Ambulatory Added or deleted any medications: No Arrival Time: 09:01 Any new allergies or adverse reactions: No Accompanied By: self Had a fall or experienced change in No Transfer Assistance: None activities of daily living that may affect Patient Identification Verified: Yes risk of falls: Secondary Verification Process Yes Signs or symptoms of abuse/neglect since last visito No Completed: Hospitalized since last visit: No Patient Has Alerts: Yes Implantable device outside of the clinic excluding No Patient Alerts: Patient on Blood cellular tissue based products placed in the center Thinner since last visit: DMII Pain Present Now: Yes warfarin Electronic Signature(s) Signed: 12/01/2017 3:39:18 PM By: Roger Shelter Entered By: Roger Shelter on 12/01/2017 09:01:51 Weyand, [name] ([id number]) -------------------------------------------------------------------------------- Clinic Level of Care Assessment Details Patient Name: [name] Date of Service: 12/01/2017 8:45 AM Medical Record Number: [id number] Patient Account Number: [id number] Date of Birth/Sex: May 22, 1950 (67 y.o. M) Treating RN: Roger Shelter Primary Care Brandon Wiechman: Royetta Crochet Other Clinician: Referring Carlitos Bottino: Royetta Crochet Treating Kendyn Zaman/Extender: Cathie Olden in Treatment: 5 Clinic Level of Care Assessment Items TOOL 4  Quantity Score X - Use when only an EandM is performed on FOLLOW-UP visit 1 0 ASSESSMENTS - Nursing Assessment / Reassessment X - Reassessment of Co-morbidities (includes updates in patient status) 1 10 X- 1 5 Reassessment of Adherence to Treatment Plan ASSESSMENTS - Wound and Skin Assessment / Reassessment X - Simple Wound Assessment / Reassessment - one wound 1 5 []  - 0 Complex Wound Assessment / Reassessment - multiple wounds []  - 0 Dermatologic / Skin Assessment (not related to wound area) ASSESSMENTS - Focused Assessment []  - Circumferential Edema Measurements - multi extremities 0 []  - 0 Nutritional Assessment / Counseling / Intervention []  - 0 Lower Extremity Assessment (monofilament, tuning fork, pulses) []  - 0 Peripheral Arterial Disease Assessment (using hand held doppler) ASSESSMENTS - Ostomy and/or Continence Assessment and Care []  - Incontinence Assessment and Management 0 []  - 0 Ostomy Care Assessment and Management (repouching, etc.) PROCESS - Coordination of Care X - Simple Patient / Family Education for ongoing care 1 15 []  - 0 Complex (extensive) Patient / Family Education for ongoing care []  - 0 Staff obtains Programmer, systems, Records, Test Results / Process Orders []  - 0 Staff telephones HHA, Nursing Homes / Clarify orders / etc []  - 0 Routine Transfer to another Facility (non-emergent condition) []  - 0 Routine Hospital Admission (non-emergent condition) []  - 0 New Admissions / Biomedical engineer / Ordering NPWT, Apligraf, etc. []  - 0 Emergency Hospital Admission (emergent condition) X- 1 10 Simple Discharge Coordination Blas, Deonta ([id number]) []  - 0 Complex (extensive) Discharge Coordination PROCESS - Special Needs []  - Pediatric / Minor Patient Management 0 []  - 0 Isolation Patient Management []  - 0 Hearing / Language / Visual special needs []  - 0 Assessment of Community assistance (transportation, D/C planning, etc.) []  -  0 Additional assistance / Altered mentation []  - 0 Support Surface(s) Assessment (bed, cushion, seat, etc.) INTERVENTIONS - Wound Cleansing / Measurement X - Simple Wound Cleansing - one wound 1 5 []  - 0 Complex Wound Cleansing -  multiple wounds X- 1 5 Wound Imaging (photographs - any number of wounds) []  - 0 Wound Tracing (instead of photographs) X- 1 5 Simple Wound Measurement - one wound []  - 0 Complex Wound Measurement - multiple wounds INTERVENTIONS - Wound Dressings X - Small Wound Dressing one or multiple wounds 1 10 []  - 0 Medium Wound Dressing one or multiple wounds []  - 0 Large Wound Dressing one or multiple wounds []  - 0 Application of Medications - topical []  - 0 Application of Medications - injection INTERVENTIONS - Miscellaneous []  - External ear exam 0 []  - 0 Specimen Collection (cultures, biopsies, blood, body fluids, etc.) []  - 0 Specimen(s) / Culture(s) sent or taken to Lab for analysis []  - 0 Patient Transfer (multiple staff / Civil Service fast streamer / Similar devices) []  - 0 Simple Staple / Suture removal (25 or less) []  - 0 Complex Staple / Suture removal (26 or more) []  - 0 Hypo / Hyperglycemic Management (close monitor of Blood Glucose) []  - 0 Ankle / Brachial Index (ABI) - do not check if billed separately []  - 0 Vital Signs Cessna, Kendall ([id number]) Has the patient been seen at the hospital within the last three years: Yes Total Score: 70 Level Of Care: New/Established - Level 2 Electronic Signature(s) Signed: 12/01/2017 3:39:18 PM By: Roger Shelter Entered By: Roger Shelter on 12/01/2017 09:20:47 Reaver, [name] ([id number]) -------------------------------------------------------------------------------- Compression Therapy Details Patient Name: [name] Date of Service: 12/01/2017 8:45 AM Medical Record Number: [id number] Patient Account Number: [id number] Date of Birth/Sex: January 20, 1951 (67 y.o. M) Treating RN: Roger Shelter Primary Care Gabreil Yonkers: Royetta Crochet Other Clinician: Referring Grason Brailsford: Royetta Crochet Treating Marleah Beever/Extender: Cathie Olden in Treatment: 5 Compression Therapy Performed for Wound Assessment: Wound #8 Right,Proximal,Medial Lower Leg Performed By: Clinician Roger Shelter, RN Compression Type: Three Layer Electronic Signature(s) Signed: 12/01/2017 3:39:18 PM By: Roger Shelter Entered By: Roger Shelter on 12/01/2017 09:18:46 Dygert, [name] ([id number]) -------------------------------------------------------------------------------- Encounter Discharge Information Details Patient Name: [name] Date of Service: 12/01/2017 8:45 AM Medical Record Number: [id number] Patient Account Number: [id number] Date of Birth/Sex: [birth year] (67 y.o. M) Treating RN: Roger Shelter Primary Care Czar Ysaguirre: Royetta Crochet Other Clinician: Referring Effie Wahlert: Royetta Crochet Treating Acelynn Dejonge/Extender: Cathie Olden in Treatment: 5 Encounter Discharge Information Items Discharge Condition: Stable Ambulatory Status: Ambulatory Discharge Destination: Home Transportation: Private Auto Schedule Follow-up Appointment: Yes Clinical Summary of Care: Electronic Signature(s) Signed: 12/01/2017 3:39:18 PM By: Roger Shelter Entered By: Roger Shelter on 12/01/2017 09:20:22 Ivey, [name] ([id number]) -------------------------------------------------------------------------------- Patient/Caregiver Education Details Patient Name: [name] Date of Service: 12/01/2017 8:45 AM Medical Record Number: [id number] Patient Account Number: [id number] Date of Birth/Gender: 07/15/1950 (67 y.o. M) Treating RN: Roger Shelter Primary Care Physician: Royetta Crochet Other Clinician: Referring Physician: Royetta Crochet Treating Physician/Extender: Cathie Olden in Treatment: 5 Education Assessment Education Provided To: Patient Education Topics  Provided Wound/Skin Impairment: Handouts: Caring for Your Ulcer Methods: Explain/Verbal Responses: State content correctly Electronic Signature(s) Signed: 12/01/2017 3:39:18 PM By: Roger Shelter Entered By: Roger Shelter on 12/01/2017 09:20:11 Hicks, [name] ([id number]) -------------------------------------------------------------------------------- Wound Assessment Details Patient Name: [name] Date of Service: 12/01/2017 8:45 AM Medical Record Number: [id number] Patient Account Number: [id number] Date of Birth/Sex: 11-22-1950 (67 y.o. M) Treating RN: Roger Shelter Primary Care [place] Capers: Royetta Crochet Other Clinician: Referring Eyad Rochford: Royetta Crochet Treating Karolina Zamor/Extender: Cathie Olden in Treatment: 5 Wound Status Wound Number: 8 Primary Diabetic Wound/Ulcer of the Lower Extremity Etiology: Wound Location: Right Lower Leg - Medial, Proximal Secondary Lymphedema Wounding Event: Gradually Appeared Etiology: Date  Acquired: 10/17/2017 Wound Open Weeks Of Treatment: 5 Status: Clustered Wound: No Comorbid Chronic Obstructive Pulmonary Disease History: (COPD), Arrhythmia, Deep Vein Thrombosis, Hypertension, Type II Diabetes, Gout, Neuropathy Photos Photo Uploaded By: Roger Shelter on 12/01/2017 15:39:58 Wound Measurements Length: (cm) 6.3 Width: (cm) 5 Depth: (cm) 0.3 Area: (cm) 24.74 Volume: (cm) 7.422 % Reduction in Area: -14893.9% % Reduction in Volume: -46287.5% Epithelialization: Medium (34-66%) Tunneling: No Undermining: No Wound Description Classification: Grade 1 Wound Margin: Flat and Intact Exudate Amount: Large Exudate Type: Serosanguineous Exudate Color: red, brown Foul Odor After Cleansing: Yes Due to Product Use: No Slough/Fibrino Yes Wound Bed Granulation Amount: Medium (34-66%) Exposed Structure Granulation Quality: Pink Fascia Exposed: No Necrotic Amount: Medium (34-66%) Fat Layer (Subcutaneous Tissue)  Exposed: Yes Necrotic Quality: Adherent Slough Tendon Exposed: No Muscle Exposed: No Joint Exposed: No Bon, Nuchem ([id number]) Bone Exposed: No Periwound Skin Texture Texture Color No Abnormalities Noted: No No Abnormalities Noted: No Callus: No Atrophie Blanche: No Crepitus: No Cyanosis: No Excoriation: No Ecchymosis: No Induration: No Erythema: No Rash: No Hemosiderin Staining: No Scarring: No Mottled: No Pallor: No Moisture Rubor: No No Abnormalities Noted: No Dry / Scaly: No Temperature / Pain Maceration: Yes Temperature: No Abnormality Tenderness on Palpation: Yes Wound Preparation Ulcer Cleansing: Rinsed/Irrigated with Saline, Other: soap and water, Topical Anesthetic Applied: None Treatment Notes Wound #8 (Right, Proximal, Medial Lower Leg) 1. Cleansed with: Clean wound with Normal Saline 3. Peri-wound Care: Moisturizing lotion 4. Dressing Applied: Iodoflex 5. Secondary Dressing Applied ABD Pad 7. Secured with 3 Layer Compression System - Right Lower Extremity Notes xtrasorb, abd pad Electronic Signature(s) Signed: 12/01/2017 3:39:18 PM By: Roger Shelter Entered By: Roger Shelter on 12/01/2017 09:08:05

## 2017-12-06 ENCOUNTER — Encounter: Payer: Medicare Other | Admitting: Physician Assistant

## 2017-12-06 DIAGNOSIS — E11622 Type 2 diabetes mellitus with other skin ulcer: Secondary | ICD-10-CM | POA: Diagnosis not present

## 2017-12-08 DIAGNOSIS — E11622 Type 2 diabetes mellitus with other skin ulcer: Secondary | ICD-10-CM | POA: Diagnosis not present

## 2017-12-13 ENCOUNTER — Encounter: Payer: Medicare Other | Admitting: Physician Assistant

## 2017-12-13 DIAGNOSIS — E11622 Type 2 diabetes mellitus with other skin ulcer: Secondary | ICD-10-CM | POA: Diagnosis not present

## 2017-12-15 NOTE — Progress Notes (Signed)
LAWSON, MAHONE ([id number]) Visit Report for 12/08/2017 Arrival Information Details Patient Name: [name], [name] Date of Service: 12/08/2017 10:15 AM Medical Record Number: [id number] Patient Account Number: [id number] Date of Birth/Sex: 03/25/1951 (67 y.o. M) Treating RN: Ahmed Prima Primary Care Chamar Broughton: Royetta Crochet Other Clinician: Referring Suleima Ohlendorf: Royetta Crochet Treating Davyn Morandi/Extender: Cathie Olden in Treatment: 6 Visit Information History Since Last Visit All ordered tests and consults were completed: No Patient Arrived: Ambulatory Added or deleted any medications: No Arrival Time: 08:05 Any new allergies or adverse reactions: No Accompanied By: self Had a fall or experienced change in No Transfer Assistance: None activities of daily living that may affect Patient Identification Verified: Yes risk of falls: Secondary Verification Process Yes Signs or symptoms of abuse/neglect since last visito No Completed: Hospitalized since last visit: No Patient Requires Transmission-Based No Implantable device outside of the clinic excluding No Precautions: cellular tissue based products placed in the center Patient Has Alerts: Yes since last visit: Patient Alerts: Patient on Blood Has Dressing in Place as Prescribed: Yes Thinner Has Compression in Place as Prescribed: Yes DMII warfarin Pain Present Now: No Electronic Signature(s) Signed: 12/12/2017 8:05:58 AM By: Alric Quan Entered By: Alric Quan on 12/12/2017 08:05:57 Crom, [name] ([id number]) -------------------------------------------------------------------------------- Wound Assessment Details Patient Name: [name] Date of Service: 12/08/2017 10:15 AM Medical Record Number: [id number] Patient Account Number: [id number] Date of Birth/Sex: [birth year] (66 y.o. M) Treating RN: Ahmed Prima Primary Care Konnar Ben: Royetta Crochet Other Clinician: Referring Deano Tomaszewski:  Royetta Crochet Treating Cage Gupton/Extender: Cathie Olden in Treatment: 6 Wound Status Wound Number: 8 Primary Diabetic Wound/Ulcer of the Lower Extremity Etiology: Wound Location: Right Lower Leg - Medial, Proximal Secondary Lymphedema Wounding Event: Gradually Appeared Etiology: Date Acquired: 10/17/2017 Wound Open Weeks Of Treatment: 6 Status: Clustered Wound: No Comorbid Chronic Obstructive Pulmonary Disease History: (COPD), Arrhythmia, Deep Vein Thrombosis, Hypertension, Type II Diabetes, Gout, Neuropathy Wound Measurements Length: (cm) 4.5 Width: (cm) 2.4 Depth: (cm) 0.2 Area: (cm) 8.482 Volume: (cm) 1.696 % Reduction in Area: -5040.6% % Reduction in Volume: -10500% Epithelialization: Small (1-33%) Tunneling: No Undermining: No Wound Description Classification: Grade 2 Wound Margin: Flat and Intact Exudate Amount: Large Exudate Type: Serosanguineous Exudate Color: red, brown Foul Odor After Cleansing: No Slough/Fibrino Yes Wound Bed Granulation Amount: Medium (34-66%) Exposed Structure Granulation Quality: Pink Fascia Exposed: No Necrotic Amount: Medium (34-66%) Fat Layer (Subcutaneous Tissue) Exposed: Yes Necrotic Quality: Adherent Slough Tendon Exposed: No Muscle Exposed: No Joint Exposed: No Bone Exposed: No Periwound Skin Texture Texture Color No Abnormalities Noted: No No Abnormalities Noted: No Callus: No Atrophie Blanche: No Crepitus: No Cyanosis: No Excoriation: No Ecchymosis: No Induration: No Erythema: No Rash: No Hemosiderin Staining: No Scarring: No Mottled: No Pallor: No Moisture Rubor: No No Abnormalities Noted: No Dry / Scaly: No Temperature / Pain Maceration: Yes Temperature: No Abnormality Tenderness on Palpation: Yes Wound Preparation Ulcer Cleansing: Rinsed/Irrigated with Saline, Other: soap and water, Topical Anesthetic Applied: None Spira, Vinicius ([id number]) Electronic Signature(s) Signed: 12/12/2017  8:06:45 AM By: Alric Quan Entered By: Alric Quan on 12/12/2017 08:06:44

## 2017-12-20 ENCOUNTER — Encounter: Payer: Medicare Other | Attending: Nurse Practitioner | Admitting: Nurse Practitioner

## 2017-12-20 DIAGNOSIS — F17210 Nicotine dependence, cigarettes, uncomplicated: Secondary | ICD-10-CM | POA: Insufficient documentation

## 2017-12-20 DIAGNOSIS — L97812 Non-pressure chronic ulcer of other part of right lower leg with fat layer exposed: Secondary | ICD-10-CM | POA: Diagnosis present

## 2017-12-20 DIAGNOSIS — J449 Chronic obstructive pulmonary disease, unspecified: Secondary | ICD-10-CM | POA: Diagnosis not present

## 2017-12-20 DIAGNOSIS — I10 Essential (primary) hypertension: Secondary | ICD-10-CM | POA: Diagnosis not present

## 2017-12-20 DIAGNOSIS — Z86718 Personal history of other venous thrombosis and embolism: Secondary | ICD-10-CM | POA: Diagnosis not present

## 2017-12-20 DIAGNOSIS — E1140 Type 2 diabetes mellitus with diabetic neuropathy, unspecified: Secondary | ICD-10-CM | POA: Insufficient documentation

## 2017-12-20 DIAGNOSIS — Z8673 Personal history of transient ischemic attack (TIA), and cerebral infarction without residual deficits: Secondary | ICD-10-CM | POA: Insufficient documentation

## 2017-12-20 DIAGNOSIS — E11622 Type 2 diabetes mellitus with other skin ulcer: Secondary | ICD-10-CM | POA: Diagnosis not present

## 2017-12-20 DIAGNOSIS — I872 Venous insufficiency (chronic) (peripheral): Secondary | ICD-10-CM | POA: Diagnosis not present

## 2017-12-25 NOTE — Progress Notes (Signed)
[name] ([id number]) Visit Report for 12/13/2017 Arrival Information Details Patient Name: [name], [name] Date of Service: 12/13/2017 8:30 AM Medical Record Number: [id number] Patient Account Number: [id number] Date of Birth/Sex: [birth year] (67 y.o. M) Treating RN: Roger Shelter Primary Care Jacquel Mccamish: Royetta Crochet Other Clinician: Referring Reneka Nebergall: Royetta Crochet Treating Shalisa Mcquade/Extender: Melburn Hake, HOYT Weeks in Treatment: 7 Visit Information History Since Last Visit All ordered tests and consults were completed: No Patient Arrived: Ambulatory Added or deleted any medications: No Arrival Time: 08:19 Any new allergies or adverse reactions: No Accompanied By: self Had a fall or experienced change in No Transfer Assistance: None activities of daily living that may affect Patient Identification Verified: Yes risk of falls: Secondary Verification Process Yes Signs or symptoms of abuse/neglect since last visito No Completed: Hospitalized since last visit: No Patient Requires Transmission-Based No Implantable device outside of the clinic excluding No Precautions: cellular tissue based products placed in the center Patient Has Alerts: Yes since last visit: Patient Alerts: Patient on Blood Pain Present Now: No Thinner DMII warfarin Electronic Signature(s) Signed: 12/14/2017 5:09:20 PM By: Roger Shelter Entered By: Roger Shelter on 12/13/2017 08:19:37 Nick, [name] ([id number]) -------------------------------------------------------------------------------- Encounter Discharge Information Details Patient Name: [name] Date of Service: 12/13/2017 8:30 AM Medical Record Number: [id number] Patient Account Number: [id number] Date of Birth/Sex: 1950/09/05 (66 y.o. M) Treating RN: Cornell Barman Primary Care Marvena Tally: Royetta Crochet Other Clinician: Referring Ellie Bryand: Royetta Crochet Treating Shaymus Eveleth/Extender: Melburn Hake, HOYT Weeks in  Treatment: 7 Encounter Discharge Information Items Discharge Condition: Stable Ambulatory Status: Ambulatory Discharge Destination: Home Transportation: Private Auto Schedule Follow-up Appointment: Yes Clinical Summary of Care: Electronic Signature(s) Signed: 12/16/2017 6:17:48 PM By: Gretta Cool, [name], RN, CWS, [name], [name] Entered By: Gretta Cool, [name], RN, CWS, Kim on 12/13/2017 09:24:23 [name] ([id number]) -------------------------------------------------------------------------------- Lower Extremity Assessment Details Patient Name: [name] Date of Service: 12/13/2017 8:30 AM Medical Record Number: [id number] Patient Account Number: [id number] Date of Birth/Sex: 1951/01/22 (66 y.o. M) Treating RN: Roger Shelter Primary Care Taylin Mans: Royetta Crochet Other Clinician: Referring Mane Consolo: Royetta Crochet Treating Remedy Corporan/Extender: STONE III, HOYT Weeks in Treatment: 7 Edema Assessment Assessed: [Left: No] [Right: No] Edema: [Left: Ye] [Right: s] Calf Left: Right: Point of Measurement: 40 cm From Medial Instep cm 42 cm Ankle Left: Right: Point of Measurement: 14 cm From Medial Instep cm 29.2 cm Vascular Assessment Claudication: Claudication Assessment [Right:None] Pulses: Dorsalis Pedis Palpable: [Right:Yes] Posterior Tibial Extremity colors, hair growth, and conditions: Extremity Color: [Right:Hyperpigmented] Hair Growth on Extremity: [Right:Yes] Temperature of Extremity: [Right:Warm] Capillary Refill: [Right:< 3 seconds] Toe Nail Assessment Left: Right: Thick: Yes Discolored: Yes Deformed: Yes Improper Length and Hygiene: Yes Electronic Signature(s) Signed: 12/14/2017 5:09:20 PM By: Roger Shelter Entered By: Roger Shelter on 12/13/2017 08:23:49 Sahagian, [name] ([id number]) -------------------------------------------------------------------------------- Multi Wound Chart Details Patient Name: [name] Date of Service: 12/13/2017 8:30  AM Medical Record Number: [id number] Patient Account Number: [id number] Date of Birth/Sex: [birth year] (66 y.o. 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] [N/A:N/A] Secondary Etiology: [8:Lymphedema] [N/A:N/A] Comorbid History: [8:Chronic Obstructive Pulmonary Disease (COPD), Arrhythmia, Deep Vein Thrombosis, Hypertension, Type II Diabetes, Gout, Neuropathy] [N/A:N/A] Date Acquired: [8:10/17/2017] [N/A:N/A] Weeks of  Treatment: [8:7] [N/A:N/A] Wound Status: [8:Open] [N/A:N/A] Measurements L x W x D [8:6x5x0.2] [N/A:N/A] (cm) Area (cm) : [8:23.562] [N/A:N/A] Volume (cm) : [8:4.712] [N/A:N/A] % Reduction in Area: [8:-14180.00%] [N/A:N/A] % Reduction in Volume: [8:-29350.00%] [N/A:N/A] Classification: [8:Grade 2] [N/A:N/A] Exudate Amount: [8:Large] [N/A:N/A] Exudate Type: [8:Serosanguineous] [N/A:N/A] Exudate Color: [8:red, brown] [N/A:N/A] Foul Odor After Cleansing: [8:Yes] [N/A:N/A] Odor Anticipated Due to [8:No] [N/A:N/A] Product Use: Wound Margin: [8:Flat and Intact] [N/A:N/A] Granulation Amount: [8:Small (1-33%)] [N/A:N/A] Granulation Quality: [8:Red, Pink] [N/A:N/A] Necrotic Amount: [8:Large (67-100%)] [N/A:N/A] Exposed Structures: [8:Fat Layer (Subcutaneous Tissue) Exposed: Yes Fascia: No Tendon: No] [N/A:N/A] Muscle: No Joint: No Bone: No Epithelialization: None N/A N/A Periwound Skin Texture: Excoriation: No N/A N/A Induration: No Callus:  No Crepitus: No Rash: No Scarring: No Periwound Skin Moisture: Maceration: Yes N/A N/A Dry/Scaly: No Periwound Skin Color: Atrophie Blanche: No N/A N/A Cyanosis: No Ecchymosis: No Erythema: No Hemosiderin Staining: No Mottled: No Pallor: No Rubor: No Temperature: No Abnormality N/A N/A Tenderness on Palpation: Yes N/A N/A Wound Preparation: Ulcer Cleansing: N/A N/A Rinsed/Irrigated with Saline, Other: soap and water Topical Anesthetic Applied: Other: lidocaine 4% Treatment Notes Electronic Signature(s) Signed: 12/16/2017 6:17:48 PM By: Gretta Cool, [name], RN, CWS, [name], [name] Entered By: Gretta Cool, [name], RN, CWS, Kim on 12/13/2017 09:07:21 [name] ([id number]) -------------------------------------------------------------------------------- Multi-Disciplinary Care Plan Details Patient Name: [name] Date of Service: 12/13/2017 8:30 AM Medical Record Number: [id number] Patient Account Number: [id number] Date of Birth/Sex: 16-Jun-1950 (66 y.o. M) Treating RN: Cornell Barman Primary Care Digby Groeneveld: Royetta Crochet Other Clinician: Referring Jancarlos Thrun: Royetta Crochet Treating Gean Laursen/Extender: Melburn Hake, HOYT Weeks in Treatment: 7 Active Inactive ` Abuse / Safety / Falls / Self Care Management Nursing Diagnoses: Potential for falls Goals: Patient will not experience any injury related to falls Date Initiated: 10/25/2017 Target Resolution Date: 02/18/2018 Goal Status: Active Interventions: Assess Activities of Daily Living upon admission and as needed Assess fall risk on admission and as needed Assess: immobility, friction, shearing, incontinence upon admission and as needed Assess impairment of mobility on admission and as needed per policy Assess personal safety and home safety (as indicated) on admission and as needed Assess self care needs on admission and as needed Notes: ` Nutrition Nursing Diagnoses: Imbalanced nutrition Impaired glucose control: actual or  potential Potential for alteratiion in Nutrition/Potential for imbalanced nutrition Goals: Patient/caregiver agrees to and verbalizes understanding of need to use nutritional supplements and/or vitamins as prescribed Date Initiated: 10/25/2017 Target Resolution Date: 02/18/2018 Goal Status: Active Patient/caregiver will maintain therapeutic glucose control Date Initiated: 10/25/2017 Target Resolution Date: 02/18/2018 Goal Status: Active Interventions: Assess patient nutrition upon admission and as needed per policy Provide education on elevated blood sugars and impact on wound healing Provide education on nutrition JERIS, ROSER ([id number]) Notes: ` Orientation to the Wound Care Program Nursing Diagnoses: Knowledge deficit related to the wound healing center program Goals: Patient/caregiver will verbalize understanding of the [facility] Date Initiated: 10/25/2017 Target Resolution Date: 11/19/2017 Goal Status: Active Interventions: Provide education on orientation to the wound center Notes: ` Wound/Skin Impairment Nursing Diagnoses: Impaired tissue integrity Knowledge deficit related to ulceration/compromised skin integrity Goals: Ulcer/skin breakdown will have a volume reduction of 80% by week 12 Date Initiated: 10/25/2017 Target Resolution Date: 02/11/2018 Goal Status: Active Interventions: Assess patient/caregiver ability to perform ulcer/skin care regimen upon admission and as needed Assess ulceration(s) every visit Notes: Electronic Signature(s) Signed: 12/16/2017 6:17:48 PM By: Gretta Cool, [name], RN, CWS, [name], [name] Entered By: Gretta Cool, [name], RN, CWS, Kim on 12/13/2017 09:07:03  SEVIN, LANGENBACH ([id number]) -------------------------------------------------------------------------------- Pain Assessment Details Patient Name: NAREK, KNISS Date of Service: 12/13/2017 8:30 AM Medical Record Number: [id number] Patient Account Number: [id number] Date of  Birth/Sex: [birth year] (66 y.o. M) Treating RN: Roger Shelter Primary Care Pheonix Clinkscale: Royetta Crochet Other Clinician: Referring Zalyn Amend: Royetta Crochet Treating Bradrick Kamau/Extender: Melburn Hake, HOYT Weeks in Treatment: 7 Active Problems Location of Pain Severity and Description of Pain Patient Has Paino No Site Locations Pain Management and Medication Current Pain Management: Electronic Signature(s) Signed: 12/14/2017 5:09:20 PM By: Roger Shelter Entered By: Roger Shelter on 12/13/2017 08:19:46 Schlereth, [name] ([id number]) -------------------------------------------------------------------------------- Patient/Caregiver Education Details Patient Name: [name] Date of Service: 12/13/2017 8:30 AM Medical Record Number: [id number] Patient Account Number: [id number] Date of Birth/Gender: 1951-04-03 (66 y.o. M) Treating RN: Cornell Barman Primary Care Physician: Royetta Crochet Other Clinician: Referring Physician: Royetta Crochet Treating Physician/Extender: Sharalyn Ink in Treatment: 7 Education Assessment Education Provided To: Patient Education Topics Provided Wound Debridement: Handouts: Wound Debridement Methods: Explain/Verbal Responses: State content correctly Wound/Skin Impairment: Handouts: Caring for Your Ulcer Methods: Explain/Verbal Responses: State content correctly Electronic Signature(s) Signed: 12/16/2017 6:17:48 PM By: Gretta Cool, [name], RN, CWS, [name], [name] Entered By: Gretta Cool, [name], RN, CWS, Kim on 12/13/2017 09:24:45 Tinkey, [name] ([id number]) -------------------------------------------------------------------------------- Wound Assessment Details Patient Name: [name] Date of Service: 12/13/2017 8:30 AM Medical Record Number: [id number] Patient Account Number: [id number] Date of Birth/Sex: 1951-02-23 (66 y.o. M) Treating RN: Roger Shelter Primary Care Maks Cavallero: Royetta Crochet Other Clinician: Referring Kenyetta Wimbish: Royetta Crochet Treating Arrianna Catala/Extender: STONE III, HOYT Weeks in Treatment: 7 Wound Status Wound Number: 8 Primary Diabetic Wound/Ulcer of the Lower Extremity Etiology: Wound Location: Right Lower Leg - Medial, Proximal Secondary Lymphedema Wounding Event: Gradually Appeared Etiology: Date Acquired: 10/17/2017 Wound Open Weeks Of Treatment: 7 Status: Clustered Wound: No Comorbid Chronic Obstructive Pulmonary Disease History: (COPD), Arrhythmia, Deep Vein Thrombosis, Hypertension, Type II Diabetes, Gout, Neuropathy Photos Photo Uploaded By: Roger Shelter on 12/13/2017 09:08:07 Wound Measurements Length: (cm) 6 Width: (cm) 5 Depth: (cm) 0.2 Area: (cm) 23.562 Volume: (cm) 4.712 % Reduction in Area: -14180% % Reduction in Volume: -29350% Epithelialization: None Tunneling: No Undermining: No Wound Description Classification: Grade 2 Wound Margin: Flat and Intact Exudate Amount: Large Exudate Type: Serosanguineous Exudate Color: red, brown Foul Odor After Cleansing: Yes Due to Product Use: No Slough/Fibrino Yes Wound Bed Granulation Amount: Small (1-33%) Exposed Structure Granulation Quality: Red, Pink Fascia Exposed: No Necrotic Amount: Large (67-100%) Fat Layer (Subcutaneous Tissue) Exposed: Yes Necrotic Quality: Adherent Slough Tendon Exposed: No Muscle Exposed: No Joint Exposed: No Rion, Armanii ([id number]) Bone Exposed: No Periwound Skin Texture Texture Color No Abnormalities Noted: No No Abnormalities Noted: No Callus: No Atrophie Blanche: No Crepitus: No Cyanosis: No Excoriation: No Ecchymosis: No Induration: No Erythema: No Rash: No Hemosiderin Staining: No Scarring: No Mottled: No Pallor: No Moisture Rubor: No No Abnormalities Noted: No Dry / Scaly: No Temperature / Pain Maceration: Yes Temperature: No Abnormality Tenderness on Palpation: Yes Wound Preparation Ulcer Cleansing: Rinsed/Irrigated with Saline, Other: soap and  water, Topical Anesthetic Applied: Other: lidocaine 4%, Electronic Signature(s) Signed: 12/14/2017 5:09:20 PM By: Roger Shelter Entered By: Roger Shelter on 12/13/2017 08:21:46 Waldman, [name] ([id number]) -------------------------------------------------------------------------------- Vitals Details Patient Name: [name] Date of Service: 12/13/2017 8:30 AM Medical Record Number: [id number] Patient Account Number: [id number] Date of Birth/Sex: 09-03-1950 (66 y.o. M) Treating RN: Roger Shelter Primary Care Markeesha Char: Royetta Crochet Other Clinician: Referring Unika Nazareno: Royetta Crochet Treating Stiven Kaspar/Extender: STONE III, HOYT Weeks in Treatment: 7 Vital Signs Time Taken: 08:15  Temperature (F): 97.6 Height (in): 74 Pulse (bpm): 82 Weight (lbs): 236 Respiratory Rate (breaths/min): 18 Body Mass Index (BMI): 30.3 Blood Pressure (mmHg): 152/78 Reference Range: 80 - 120 mg / dl Electronic Signature(s) Signed: 12/14/2017 5:09:20 PM By: Roger Shelter Entered By: Roger Shelter on 12/13/2017 08:20:05

## 2017-12-26 DIAGNOSIS — E11622 Type 2 diabetes mellitus with other skin ulcer: Secondary | ICD-10-CM | POA: Diagnosis not present

## 2017-12-27 ENCOUNTER — Ambulatory Visit: Payer: Medicare Other | Admitting: Nurse Practitioner

## 2017-12-28 NOTE — Progress Notes (Signed)
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 right lower extremity ulcer appears to be doing very well. He has been tolerating the [facility]  Dressing and tells me that fortunately he is finally improvement. He is pleased with how this is progressing. [name], [name] ([id number]) 11/30/16; improved using Hydrofera Blue 12/07/16; continue dramatic progress. Wound is now small and healthy looking. Continue use of Hydrofera Blue 12/14/16; very small wound albeit with some depth. Continued use of Hydrofera blue. 12/21/16 on evaluation today patient's right lower extremity ulcer appears healed at this point. He is having no discomfort and overall this is doing very well. And there's no sign of infection. 04/26/17; READMISSION Patient we know from previous stays in this clinic. The patient has known diabetic PAD and has a stent in the right leg. 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 When he was last in the clinic in August we had healed him out for what was felt to be a mostly venous insufficiency wound on the medial right leg. He follows with vascular surgery Dr. dew is at [place] [facility]. He has previously undergone successful laser ablation. Reflux studies on 4/24 showed reflux in the common femoral, femoral and popliteal. He underwent successful ablation of the right greater saphenous vein from the distal thigh to the mid calf level. Successful ablation of the right small saphenous vein. He had unsuccessful ablation of the right greater saphenous vein from the saphenofemoral junction to the distal 5 level. The patient wears to let her compression stockings and he has been compliant with this With regards to his diabetes he is not currently on any treatment. He does describe pain in his leg which forces him to stop although I couldn't really quantify this. His last arterial studies were on 07/05/16 which showed triphasic waveforms on the right to the level of the popliteal artery. The right posterior tibial and anterior tibial artery were not visualized on this study. I don't actually see ABIs  or TBIs. 05/04/17; the patient has small wounds on the right medial heel and the right medial leg. The area on the foot is just about closed. The area on the right medial leg is improved. He is tolerating the 3 layer compression without any complaints. The patient has both known PAD and chronic venous reflux [see discussion above]. 05/11/17; the areas on the patient's medial right heel with healed he still has an open area on the right medial leg. This does not require debridement today I think I did read it last week. We are using silver collagen under 3 layer compression. 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 He has been tolerating the dressing changes without complication. With that being said he does have some epithelialization growing into the wound bed and it appears this wound may be trying to healed in a depressed fashion. To be  honest that is not the worst case scenario and I think this may definitely do fine even if it heals in that way. There was no requirement for debridement today as patient appears to be doing very well in regard to his ulcer. His swelling was a little bit more severe than last week but nothing too significant. 07/05/17 on evaluation today patient appears to be doing rather well in regard to his right medial lower extremity ulcer. The wound does appear to be healing although it is healing and somewhat of a cratered fashion the good news is he has excellent epithelium noted. Fortunately there does not appear to be any evidence of infection at this point he is tolerating the dressings as well is the wrap well. 07/12/17 on evaluation today patient appears to be doing very well in regard to his left medial lower extremity ulcer. He has been tolerating the dressing changes without complication. In fact  compared to last week this wound which was somewhat concave has fielding dramatically and looks to be doing excellent. I'm extremely happy with the progress. He still has pain rated to be a 4-5/10 but the wound looks great. 07/20/17; the patient's right medial lower extremity ulcer is closed. He has good edema control. Unfortunately he does not really have adequate compression stockings. 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 He has been treated with compression stockings for some time now in fact we have notes having treated him even as far back as 2016 here in our clinic. Fortunately he has excellent blood flow and tends to heal well. Unfortunately despite wearing his compression stockings, keeping his legs elevated, being on fluid pills (Lasix) and doing everything he can to try to help with fluid he still continues to have issues with this reopening. I think that he may be a good candidate for compression/lymphedema pumps. We have never attempted to get these for him previously. No fevers, chills, nausea, or vomiting noted at this time. Currently patient states his ulcer does hurt although not as bad as some in the past it's also not as large as it has been in the past when it reopened he talked is somewhat early. Obviously this is good news. [name], [name] ([id number]) 11/01/17 on evaluation today patient appears to be doing better in regard to his lower extremity ulcer. He has been tolerating the wraps without complication. Overall things seem to be showing signs of good improvement which is excellent. That's what were looking for obviously. He has always done well with  the wraps in the past as well. Fortunately there does not appear to be any evidence of infection at this point in time. 11/08/17 on evaluation today patient appears to be doing rather okay in regard to his right medial malleolus ulcer. With that being said he still seems to have some fluid control issues at this point. With that being said I think we may [name] to switch from the silvercell to maybe Prisma to try to help this and prove more significantly as far as the moisture control is concerned. Overall I do think she's made some progress and I believe that we may be able to aid him along with  just a few minor changes. His swelling seems to be doing well with the wrap. 11/15/17 on evaluation today patient's right medial malleolus ulcer appears to be very moist as far as drainage is concerned. It does appear that the Drawtex was not as beneficial for him as we would've liked. He fortunately has no evidence of infection at this time unfortunately again he did have a lot of maceration noted at this point. His wound was a little bit larger. Otherwise there is no evidence of active infection at this point. 11/22/17 on evaluation today patient's wound actually is a little bit larger and more macerated today compared to previous. The dressings that were applying do seem to be helping to trap the fluid nonetheless the wound is a little bit larger and has spread. With that being said there still slough in the surface of the wound he's had a lot of increased pain therefore I'm trying to avoid sharp debridement today at the patient request. 11/29/17 on evaluation today patient appears to be doing a little better in my pinion in regard to his right medial ankle ulcer. Nonetheless this continues to be very macerated. We have been performing at one time a week dressing change I think this may not be benefiting him I think it may be possible that he would benefit from more frequent dressing changes. At least another  rat change once during the week. Nonetheless he is not opposed to this and I think we should consider setting him up with home health. On evaluation today patient appears to be doing rather poorly in regard to his right lower [place] ulcer. It really has not shown any signs of improvement unfortunately he does have an infection with pseudomonas. Nonetheless the doxycycline is not going to work for this we need to have them on something else I'm gonna go ahead and suggest that we get him on the appropriate antibiotic Cipro. He's in agreement this plan. Unfortunately has been having a lot more pain he states it pretty much hurts all the time this is understandable considering it is infected. 12/13/17 on evaluation today patient appears to actually be doing very well in regard to his right medial ankle ulcer compared to last week's evaluation. I think having him on the correct antibiotics has made all the difference in the world. With that being said he's not having discomfort like he was previous and in general I feel like he has made excellent strides towards healing in the past week. No fevers, chills, nausea, or vomiting noted at this time. Patient History Information obtained from Patient. Family History Heart Disease - Mother,Father, Hypertension - Mother,Father, Stroke - Father, No family history of Cancer, Diabetes, Hereditary Spherocytosis, Thyroid Problems, Tuberculosis. Social History Current every day smoker, Marital Status - Single, Alcohol Use - Never, Drug Use - No History, Caffeine Use - Daily. Medical History Hospitalization/Surgery History - 05/17/2012, [facility], CVA. - 03/17/2017, [facility], Pneumonia. Medical And Surgical History Notes Ear/Nose/Mouth/Throat difficulty speaking Respiratory Hospitalized for Pneumonia 02/2017 Neurologic CVA in 2014 [name], [name] ([id number]) Review of Systems (ROS) Constitutional Symptoms (General Health) Denies complaints or symptoms of Fever,  Chills. Respiratory The patient has no complaints or symptoms. Cardiovascular Complains or has symptoms of LE edema. Psychiatric The patient has no complaints or symptoms. Objective Constitutional Well-nourished and well-hydrated in no acute distress. Vitals Time Taken: 8:15 AM, Height: 74 in, Weight: 236 lbs, BMI: 30.3, Temperature: 97.6 F, Pulse: 82 bpm, Respiratory Rate: 18 breaths/min, Blood Pressure: 152/78 mmHg. Respiratory normal  breathing without difficulty. clear to auscultation bilaterally. Cardiovascular regular rate and rhythm with normal S1, S2. 1+ pitting edema of the bilateral lower extremities. Psychiatric this patient is able to make decisions and demonstrates good insight into disease process. Alert and Oriented x 3. pleasant and cooperative. General Notes: Patient's wound bed does have some Slough noted at several locations that did require sharp debridement today. This was performed without complication post debridement the wound beds appear to be cleaner in general he's not having as much drainage maceration as previously noted I think that the wound is now finally in the healing stage which is excellent news obviously he's been frustrated with the fact it was getting larger I think the main reason for this was the infection. Integumentary (Hair, Skin) Wound #8 status is Open. Original cause of wound was Gradually Appeared. The wound is located on the Right,Proximal,Medial Lower Leg. The wound measures 6cm length x 5cm width x 0.2cm depth; 23.562cm^2 area and 4.712cm^3 volume. There is Fat Layer (Subcutaneous Tissue) Exposed exposed. There is no tunneling or undermining noted. There is a large amount of serosanguineous drainage noted. Foul odor after cleansing was noted. The wound margin is flat and intact. There is small (1-33%) red, pink granulation within the wound bed. There is a large (67-100%) amount of necrotic tissue within the wound bed including Adherent  Slough. The periwound skin appearance exhibited: Maceration. The periwound skin appearance did not exhibit: Callus, Crepitus, Excoriation, Induration, Rash, Scarring, Dry/Scaly, Atrophie Blanche, Cyanosis, Ecchymosis, Hemosiderin Staining, Mottled, Pallor, Rubor, Erythema. Periwound temperature was noted as No Abnormality. The periwound has tenderness on palpation. 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 There was a Excisional Skin/Subcutaneous Tissue Debridement with a total area of 30 sq cm performed by STONE III, HOYT E., PA-C. With the following instrument(s): Curette to remove Viable and Non-Viable tissue/material. Material removed includes Subcutaneous Tissue, Slough, and Biofilm after achieving pain control using Other (lidocaine 4%). No specimens were taken. A time out was conducted at 09:08, prior to the start of the procedure. A Minimum amount of bleeding was controlled with Pressure. The procedure was tolerated well with a pain level of 1 throughout and a pain level of 2 following the procedure. Patient s Level of Consciousness post procedure was recorded as Awake and Alert. Post Debridement Measurements: 6cm length x 5cm width x 0.3cm depth; 7.069cm^3 volume. Character of Wound/Ulcer Post Debridement is stable. Severity of Tissue Post Debridement is: Fat layer exposed. Post procedure Diagnosis Wound #8: Same as Pre-Procedure Plan Wound Cleansing: Wound #8 Right,Proximal,Medial Lower Leg: Clean wound with Normal Saline. Cleanse wound with  mild soap and water Anesthetic (add to Medication List): Wound #8 Right,Proximal,Medial Lower Leg: Topical Lidocaine 4% cream applied to wound bed prior to debridement (In Clinic Only). Skin Barriers/Peri-Wound Care: Wound #8 Right,Proximal,Medial Lower Leg: Moisturizing lotion - not on wound area Primary Wound Dressing: Wound #8 Right,Proximal,Medial Lower Leg: Silver Alginate Secondary Dressing: Wound #8 Right,Proximal,Medial Lower Leg: ABD pad XtraSorb Dressing Change Frequency: Wound #8 Right,Proximal,Medial Lower Leg: Other: - HHRN to change on Friday and pt will come to [place] on Tuesdays Follow-up Appointments: Wound #  8 Right,Proximal,Medial Lower Leg: [name], [name] ([id number]) Return Appointment in 1 week. Edema Control: Wound #8 Right,Proximal,Medial Lower Leg: 3 Layer Compression System - Right Lower Extremity - unna to anchor Other: - order bilateral compression pumps Additional Orders / Instructions: Wound #8 Right,Proximal,Medial Lower Leg: Increase protein intake. Home Health: Wound #8 Right,Proximal,Medial Lower Leg: [place] for [place] Nurse may visit PRN to address patient s wound care needs. FACE TO FACE ENCOUNTER: MEDICARE and MEDICAID PATIENTS: I certify that this patient is under my care and that I had a face-to-face encounter that meets the physician face-to-face encounter requirements with this patient on this date. The encounter with the patient was in whole or in part for the following MEDICAL CONDITION: (primary reason for [place]) MEDICAL NECESSITY: I certify, that based on my findings, NURSING services are a medically necessary home health service. HOME BOUND STATUS: I certify that my clinical findings support that this patient is homebound (i.e., Due to illness or injury, pt requires aid of supportive devices such as crutches, cane, wheelchairs, walkers, the use of  special transportation or the assistance of another person to leave their place of residence. There is a normal inability to leave the home and doing so requires considerable and taxing effort. Other absences are for medical reasons / religious services and are infrequent or of short duration when for other reasons). If current dressing causes regression in wound condition, may D/C ordered dressing product/s and apply Normal Saline Moist Dressing daily until next [place] / Other MD appointment. [place] of regression in wound condition at [phone]. Please direct any NON-WOUND related issues/requests for orders to patient's Primary Care Physician I'm gonna recommend that we go ahead and continue with the current wound care measures for the next week. He's in agreement the plan. Once which I will make is that I am gonna discontinue the Iodoflex and switch him to a silver alginate dressing. Patients in agreement with plan. Please see above for specific wound care orders. We will see patient for re-evaluation in 1 week(s) here in the clinic. If anything worsens or changes patient will contact our office for additional recommendations. Electronic Signature(s) Signed: 12/13/2017 9:24:47 PM By: Worthy Keeler PA-C Entered By: Worthy Keeler on 12/13/2017 09:43:09 [place]horn, [name] ([id number]) -------------------------------------------------------------------------------- ROS/PFSH Details Patient Name: [name] Date of Service: 12/13/2017 8:30 AM Medical Record Number: [id number] Patient Account Number: [id number] Date of Birth/Sex: October 24, 1950 (66 y.o. M) Treating RN: Ahmed Prima Primary Care Provider: Royetta Crochet Other Clinician: Referring Provider: Royetta Crochet Treating Provider/Extender: Melburn Hake, HOYT Weeks in Treatment: 7 Information Obtained From Patient Wound History Do you currently have one or more open woundso Yes How many  open wounds do you currently haveo 1 Approximately how long have you had your woundso 1 week How have you been treating your wound(s) until nowo lotion Has your wound(s) ever healed and then re-openedo Yes Have you had any lab work done in the past montho No Have you tested positive for an antibiotic resistant organism (MRSA, VRE)o No Have you tested positive for osteomyelitis (bone infection)o No Have you had any tests for circulation on your legso Yes Who ordered the testo [facility] Where was the test doneo [facility] Constitutional Symptoms (General Health) Complaints and Symptoms: Negative for: Fever; Chills Cardiovascular Complaints and Symptoms: Positive for: LE edema Medical History: Positive for: Arrhythmia - a fib; Deep Vein Thrombosis; Hypertension Negative for: Angina; Congestive  Heart Failure; Coronary Artery Disease; Hypotension; Myocardial Infarction; Peripheral Arterial Disease; Peripheral Venous Disease; Phlebitis; Vasculitis Eyes Medical History: Negative for: Cataracts; Glaucoma; Optic Neuritis Ear/Nose/Mouth/Throat Medical History: Negative for: Chronic sinus problems/congestion; Middle ear problems Past Medical History Notes: difficulty speaking Hematologic/Lymphatic Medical History: Negative for: Anemia; Hemophilia; Human Immunodeficiency Virus; Lymphedema; Sickle Cell Disease Respiratory [place], [place] ([id number]) Complaints and Symptoms: No Complaints or Symptoms Medical History: Positive for: Chronic Obstructive Pulmonary Disease (COPD) Negative for: Aspiration; Asthma; Pneumothorax; Sleep Apnea; Tuberculosis Past Medical History Notes: Hospitalized for Pneumonia 02/2017 Gastrointestinal Medical History: Negative for: Cirrhosis ; Colitis; Crohnos; Hepatitis A; Hepatitis B; Hepatitis C Endocrine Medical History: Positive for: Type II Diabetes Negative for: Type I Diabetes Time with diabetes: since 2017 Treated with: Diet Blood sugar tested every  day: No Genitourinary Medical History: Negative for: End Stage Renal Disease Immunological Medical History: Negative for: Lupus Erythematosus; Raynaudos; Scleroderma Integumentary (Skin) Medical History: Negative for: History of Burn; History of pressure wounds Musculoskeletal Medical History: Positive for: Gout Negative for: Rheumatoid Arthritis; Osteoarthritis; Osteomyelitis Neurologic Medical History: Positive for: Neuropathy Negative for: Dementia; Quadriplegia; Paraplegia; Seizure Disorder Past Medical History Notes: CVA in 2014 Oncologic Medical History: Negative for: Received Chemotherapy; Received Radiation Psychiatric JAQUELL, SEDDON ([id number]) Complaints and Symptoms: No Complaints or Symptoms Medical History: Negative for: Anorexia/bulimia; Confinement Anxiety Immunizations Pneumococcal Vaccine: Received Pneumococcal Vaccination: Yes Immunization Notes: up to date Implantable Devices Hospitalization / Surgery History Name of Hospital Purpose of Hospitalization/Surgery Date [facility] CVA 05/17/2012 [place] Pneumonia 03/17/2017 Family and Social History Cancer: No; Diabetes: No; Heart Disease: Yes - Mother,Father; Hereditary Spherocytosis: No; Hypertension: Yes - Mother,Father; Stroke: Yes - Father; Thyroid Problems: No; Tuberculosis: No; Current every day smoker; Marital Status - Single; Alcohol Use: Never; Drug Use: No History; Caffeine Use: Daily; Financial Concerns: No; Food, Clothing or Shelter Needs: No; Support System Lacking: No; Transportation Concerns: No; Advanced Directives: No; Patient does not [name] information on Advanced Directives Physician Affirmation I have reviewed and agree with the above information. Electronic Signature(s) Signed: 12/13/2017 9:24:47 PM By: Worthy Keeler PA-C Signed: 12/19/2017 4:59:33 PM By: Alric Quan Entered By: Worthy Keeler on 12/13/2017 09:41:56 Braatz, [name]  ([id number]) -------------------------------------------------------------------------------- SuperBill Details Patient Name: [name] Date of Service: 12/13/2017 Medical Record Number: [id number] Patient Account Number: [id number] Date of Birth/Sex: 1950-12-01 (66 y.o. M) Treating RN: Ahmed Prima Primary Care Provider: Royetta Crochet Other Clinician: Referring Provider: Royetta Crochet Treating Provider/Extender: Melburn Hake, HOYT Weeks in Treatment: 7 Diagnosis Coding ICD-10 Codes Code Description I87.2 Venous insufficiency (chronic) (peripheral) I89.0 Lymphedema, not elsewhere classified E11.622 Type 2 diabetes mellitus with other skin ulcer L97.812 Non-pressure chronic ulcer of other part of right lower leg with fat layer exposed Facility Procedures CPT4 Code Description: 19509326 11042 - DEB SUBQ TISSUE 20 SQ CM/< ICD-10 Diagnosis Description L97.812 Non-pressure chronic ulcer of other part of right lower leg wit Modifier: h fat layer expo Quantity: 1 sed CPT4 Code Description: 71245809 11045 - DEB SUBQ TISS EA ADDL 20CM ICD-10 Diagnosis Description X83.382 Non-pressure chronic ulcer of other part of right lower leg wit Modifier: h fat layer expo Quantity: 1 sed Physician Procedures CPT4 Code Description: 5053976 11042 - WC PHYS SUBQ TISS 20 SQ CM ICD-10 Diagnosis Description B34.193 Non-pressure chronic ulcer of other part of right lower leg with Modifier: fat layer expo Quantity: 1 sed CPT4 Code Description: 7902409 11045 - WC PHYS SUBQ TISS EA ADDL 20 CM ICD-10 Diagnosis Description B35.329 Non-pressure chronic ulcer of other part of right lower leg with Modifier:  fat layer expo Quantity: 1 sed Electronic Signature(s) Signed: 12/13/2017 9:24:47 PM By: Worthy Keeler PA-C Entered By: Worthy Keeler on 12/13/2017 09:45:37

## 2017-12-30 NOTE — Progress Notes (Signed)
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 05/11/17; the areas on the patient's medial right heel with healed he still has an open area on the right medial leg. This does not require debridement today I think I did read it last week. We are using silver collagen under 3 layer compression. 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 He has been tolerating the dressing changes without complication. With that being said he does have some epithelialization growing into the wound bed and it appears this wound may be trying to healed in a depressed fashion. To be honest that is not the worst case scenario and I think this may definitely do fine even if it heals in that way. There was no requirement for debridement today as patient appears to be doing very well in regard to his ulcer. His swelling was a little bit more severe than last week but nothing too significant. 07/05/17 on evaluation today patient appears to be doing rather well in regard to his right medial lower extremity ulcer. The wound does appear to be healing although it is healing and somewhat of a cratered fashion the good news is he has excellent epithelium noted. Fortunately there does not appear to be any evidence of infection at this point he is tolerating the dressings as well is the wrap well. 07/12/17 on evaluation today patient appears to be doing very well in regard to his left medial lower extremity ulcer. He has been tolerating the dressing changes without complication. In fact compared to last week this wound which was somewhat concave has fielding dramatically and looks to be doing excellent. I'm extremely happy with the progress. He still has pain rated to be a 4-5/10 but the wound looks great. 07/20/17; the patient's right medial lower extremity ulcer is  closed. He has good edema control. Unfortunately he does not really have adequate compression stockings. 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 He has been treated with compression stockings for some time now in fact we have notes having treated him even as far back as 2016 here in our clinic. Fortunately he has excellent blood flow and tends to heal well. Unfortunately despite wearing his compression stockings, keeping his legs elevated, being on fluid pills (Lasix) and doing everything he can to try to help with fluid he still continues to have issues with this reopening. I think that he may be a  good candidate for compression/lymphedema pumps. We have never attempted to get these for him previously. No fevers, chills, nausea, or vomiting noted at this time. Currently patient states his ulcer does hurt although not as bad as some in the past it's also not as large as it has been in the past when it reopened he talked is somewhat early. Obviously this is good news. KEENAN, TREFRY ([id number]) 11/01/17 on evaluation today patient appears to be doing better in regard to his lower extremity ulcer. He has been tolerating the wraps without complication. Overall things seem to be showing signs of good improvement which is excellent. That's what were looking for obviously. He has always done well with the wraps in the past as well. Fortunately there does not appear to be any evidence of infection at this point in time. 11/08/17 on evaluation today patient appears to be doing rather okay in regard to his right medial malleolus ulcer. With that being said he still seems to have some  fluid control issues at this point. With that being said I think we may want to switch from the silvercell to maybe Prisma to try to help this and prove more significantly as far as the moisture control is concerned. Overall I do think she's made some progress and I believe that we may be able to aid him along with just a few minor changes. His swelling seems to be doing well with the wrap. 11/15/17 on evaluation today patient's right medial malleolus ulcer appears to be very moist as far as drainage is concerned. It does appear that the Drawtex was not as beneficial for him as we would've liked. He fortunately has no evidence of infection at this time unfortunately again he did have a lot of maceration noted at this point. His wound was a little bit larger. Otherwise there is no evidence of active infection at this point. 11/22/17 on evaluation today patient's wound actually is a little bit larger and more macerated today compared to previous. The dressings that were applying do seem to be helping to trap the fluid nonetheless the wound is a little bit larger and has spread. With that being said there still slough in the surface of the wound he's had a lot of increased pain therefore I'm trying to avoid sharp debridement today at the patient request. 11/29/17 on evaluation today patient appears to be doing a little better in my pinion in regard to his right medial ankle ulcer. Nonetheless this continues to be very macerated. We have been performing at one time a week dressing change I think this may not be benefiting him I think it may be possible that he would benefit from more frequent dressing changes. At least another rat change once during the week. Nonetheless he is not opposed to this and I think we should consider setting him up with home health. On evaluation today patient appears to be doing rather poorly in regard to his right lower [place] ulcer. It really has not shown any signs of  improvement unfortunately he does have an infection with pseudomonas. Nonetheless the doxycycline is not going to work for this we need to have them on something else I'm gonna go ahead and suggest that we get him on the appropriate antibiotic Cipro. He's in agreement this plan. Unfortunately has been having a lot more pain he states it pretty much hurts all the time this is understandable considering it is infected. 12/13/17 on evaluation today patient appears to actually be  doing very well in regard to his right medial ankle ulcer compared to last week's evaluation. I think having him on the correct antibiotics has made all the difference in the world. With that being said he's not having discomfort like he was previous and in general I feel like he has made excellent strides towards healing in the past week. No fevers, chills, nausea, or vomiting noted at this time. 12/20/17-He is seen in follow-up evaluation for a right medial lower extremity wound. There is improvement. He has completed the Cipro with no GI disturbance. We will continue with the same treatment plan and he will follow-up next week Objective Constitutional Vitals Time Taken: 8:08 AM, Height: 74 in, Weight: 236 lbs, BMI: 30.3, Temperature: 97.7 F, Pulse: 80 bpm, Respiratory Rate: 18 breaths/min, Blood Pressure: 132/88 mmHg. Integumentary (Hair, Skin) Wound #8 status is Open. Original cause of wound was Gradually Appeared. The wound is located on the Right,Proximal,Medial Lower Leg. The wound measures 5cm length x 4cm width x 0.2cm depth; 15.708cm^2 area and 3.142cm^3 volume. There is Fat Layer (Subcutaneous Tissue) Exposed exposed. There is no tunneling or undermining Havlin, Norwin ([id number]) noted. There is a large amount of serosanguineous drainage noted. Foul odor after cleansing was noted. The wound margin is flat and intact. There is medium (34-66%) red, pink granulation within the wound bed. There is a medium  (34-66%) amount of necrotic tissue within the wound bed including Adherent Slough. The periwound skin appearance exhibited: Maceration. The periwound skin appearance did not exhibit: Callus, Crepitus, Excoriation, Induration, Rash, Scarring, Dry/Scaly, Atrophie Blanche, Cyanosis, Ecchymosis, Hemosiderin Staining, Mottled, Pallor, Rubor, Erythema. Periwound temperature was noted as No Abnormality. The periwound has tenderness on palpation. 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 There was a Excisional Skin/Subcutaneous Tissue Debridement with a total area of 20 sq cm performed by Lawanda Cousins, NP. With the following instrument(s): Curette to remove Viable and Non-Viable tissue/material. Material removed includes Subcutaneous Tissue, Slough, and Fibrin/Exudate after achieving pain control using Lidocaine 4% Topical Solution. No specimens were taken. A time out was conducted at 08:25, prior to the start of the procedure. A Minimum amount of bleeding was controlled with Pressure. The procedure was tolerated well with a pain level of 0 throughout and a pain level of 0 following the procedure. Patient s Level of Consciousness post procedure was recorded as Awake and Alert. Post Debridement Measurements: 5cm length x 4cm width x 0.3cm depth; 4.712cm^3 volume. Character of Wound/Ulcer Post Debridement requires further debridement. Severity of Tissue Post Debridement is: Fat layer exposed. Post procedure Diagnosis Wound #8: Same as Pre-Procedure Plan Wound Cleansing: Wound #8  Right,Proximal,Medial Lower Leg: Clean wound with Normal Saline. Cleanse wound with mild soap and water Anesthetic (add to Medication List): Wound #8 Right,Proximal,Medial Lower Leg: Topical Lidocaine 4% cream applied to wound bed prior to debridement (In Clinic Only). Skin Barriers/Peri-Wound Care: Wound #8 Right,Proximal,Medial Lower Leg: Moisturizing lotion - not on wound area Slabach, Breyon ([id number]) Primary Wound Dressing: Wound #8 Right,Proximal,Medial Lower Leg: Iodoflex Secondary Dressing: Wound #8 Right,Proximal,Medial Lower Leg: ABD pad Dry Gauze Dressing Change Frequency: Wound #8 Right,Proximal,Medial Lower Leg: Change dressing every week Follow-up Appointments: Wound #  8 Right,Proximal,Medial Lower Leg: Return Appointment in 1 week. Nurse Visit as needed Edema Control: Wound #8 Right,Proximal,Medial Lower Leg: 3 Layer Compression System - Right Lower Extremity - unna to anchor Other: - order bilateral compression pumps Additional Orders / Instructions: Wound #8 Right,Proximal,Medial Lower Leg: Increase protein intake. The following medication(s) was prescribed: lidocaine topical 4 % cream 1 1 cream topical was prescribed at facility Electronic Signature(s) Signed: 12/20/2017 8:42:52 AM By: Lawanda Cousins Entered By: Lawanda Cousins on 12/20/2017 08:42:52 Reade, Juleen [name] ([id number]) -------------------------------------------------------------------------------- SuperBill Details Patient Name: [name] Date of Service: 12/20/2017 Medical Record Number: [id number] Patient Account Number: [id number] Date of Birth/Sex: July 22, 1950 (67 y.o. M) Treating RN: Ahmed Prima Primary Care Provider: Royetta Crochet Other Clinician: Referring Provider: Royetta Crochet Treating Provider/Extender: Cathie Olden in Treatment: 8 Diagnosis Coding ICD-10 Codes Code Description I87.2 Venous insufficiency (chronic) (peripheral) I89.0 Lymphedema, not  elsewhere classified E11.622 Type 2 diabetes mellitus with other skin ulcer L97.812 Non-pressure chronic ulcer of other part of right lower leg with fat layer exposed Facility Procedures CPT4 Code Description: [id number] 11042 - DEB SUBQ TISSUE 20 SQ CM/< ICD-10 Diagnosis Description K91.791 Non-pressure chronic ulcer of other part of right lower leg wit Modifier: h fat layer expo Quantity: 1 sed Physician Procedures CPT4 Code Description: [id number] 11042 - WC PHYS SUBQ TISS 20 SQ CM ICD-10 Diagnosis Description Y80.165 Non-pressure chronic ulcer of other part of right lower leg wit Modifier: h fat layer expo Quantity: 1 sed Electronic Signature(s) Signed: 12/20/2017 8:43:18 AM By: Lawanda Cousins Entered By: Lawanda Cousins on 12/20/2017 08:43:18

## 2017-12-30 NOTE — Progress Notes (Signed)
[name] ([id number]) Visit Report for 12/20/2017 Arrival Information Details Patient Name: [name], [name] Date of Service: 12/20/2017 8:00 AM Medical Record Number: [id number] Patient Account Number: [id number] Date of Birth/Sex: 04-03-1951 (67 y.o. M) Treating RN: Roger Shelter Primary Care Malaika Arnall: Royetta Crochet Other Clinician: Referring Kaly Mcquary: Royetta Crochet Treating Isaiyah Feldhaus/Extender: Cathie Olden in Treatment: 8 Visit Information History Since Last Visit All ordered tests and consults were completed: No Patient Arrived: Ambulatory Added or deleted any medications: No Arrival Time: 08:07 Any new allergies or adverse reactions: No Accompanied By: self Had a fall or experienced change in No Transfer Assistance: None activities of daily living that may affect Patient Identification Verified: Yes risk of falls: Secondary Verification Process Yes Signs or symptoms of abuse/neglect since last visito No Completed: Hospitalized since last visit: No Patient Requires Transmission-Based No Implantable device outside of the clinic excluding No Precautions: cellular tissue based products placed in the center Patient Has Alerts: Yes since last visit: Patient Alerts: Patient on Blood Pain Present Now: No Thinner DMII warfarin Electronic Signature(s) Signed: 12/20/2017 8:47:36 AM By: Roger Shelter Entered By: Roger Shelter on 12/20/2017 08:07:53 Monje, [name] ([id number]) -------------------------------------------------------------------------------- Encounter Discharge Information Details Patient Name: [name] Date of Service: 12/20/2017 8:00 AM Medical Record Number: [id number] Patient Account Number: [id number] Date of Birth/Sex: [birth year] (67 y.o. M) Treating RN: Roger Shelter Primary Care Aniesha Haughn: Royetta Crochet Other Clinician: Referring Roshini Fulwider: Royetta Crochet Treating Araya Roel/Extender: Cathie Olden in  Treatment: 8 Encounter Discharge Information Items Discharge Condition: Stable Ambulatory Status: Ambulatory Discharge Destination: Home Transportation: Private Auto Schedule Follow-up Appointment: Yes Clinical Summary of Care: Electronic Signature(s) Signed: 12/20/2017 8:47:36 AM By: Roger Shelter Entered By: Roger Shelter on 12/20/2017 08:42:15 Petrilla, [name] ([id number]) -------------------------------------------------------------------------------- Lower Extremity Assessment Details Patient Name: [name] Date of Service: 12/20/2017 8:00 AM Medical Record Number: [id number] Patient Account Number: [id number] Date of Birth/Sex: 02/23/1951 (67 y.o. M) Treating RN: Roger Shelter Primary Care Zenya Hickam: Royetta Crochet Other Clinician: Referring Artist Bloom: Royetta Crochet Treating Janeen Watson/Extender: Cathie Olden in Treatment: 8 Edema Assessment Assessed: [Left: No] [Right: No] Edema: [Left: Ye] [Right: s] Calf Left: Right: Point of Measurement: 40 cm From Medial Instep cm 43.5 cm Ankle Left: Right: Point of Measurement: 14 cm From Medial Instep cm 29 cm Vascular Assessment Claudication: Claudication Assessment [Right:None] Pulses: Dorsalis Pedis Palpable: [Right:Yes] Posterior Tibial Extremity colors, hair growth, and conditions: Extremity Color: [Right:Normal] Hair Growth on Extremity: [Right:Yes] Temperature of Extremity: [Right:Warm] Capillary Refill: [Right:< 3 seconds] Toe Nail Assessment Left: Right: Thick: Yes Discolored: Yes Deformed: No Improper Length and Hygiene: No Electronic Signature(s) Signed: 12/20/2017 8:47:36 AM By: Roger Shelter Entered By: Roger Shelter on 12/20/2017 08:15:49 Srinivasan, [name] ([id number]) -------------------------------------------------------------------------------- Multi Wound Chart Details Patient Name: [name] Date of Service: 12/20/2017 8:00 AM Medical Record Number:  [id number] Patient Account Number: [id number] Date of Birth/Sex: November 10, 1950 (67 y.o. 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] [N/A:N/A] Secondary Etiology: [8:Lymphedema] [N/A:N/A] Comorbid History: [8:Chronic Obstructive Pulmonary Disease (COPD), Arrhythmia, Deep Vein Thrombosis, Hypertension, Type II Diabetes, Gout, Neuropathy] [N/A:N/A] Date Acquired: [8:10/17/2017] [N/A:N/A] Weeks of Treatment: [8:8] [N/A:N/A] Wound Status: [8:Open] [N/A:N/A] Measurements L x W x  D [8:5x4x0.2] [N/A:N/A] (cm) Area (cm) : [8:15.708] [N/A:N/A] Volume (cm) : [8:3.142] [N/A:N/A] % Reduction in Area: [8:-9420.00%] [N/A:N/A] % Reduction in Volume: [8:-19537.50%] [N/A:N/A] Classification: [8:Grade 2] [N/A:N/A] Exudate Amount: [8:Large] [N/A:N/A] Exudate Type: [8:Serosanguineous] [N/A:N/A] Exudate Color: [8:red, brown] [N/A:N/A] Foul Odor After Cleansing: [8:Yes] [N/A:N/A] Odor Anticipated Due to [8:No] [N/A:N/A] Product Use: Wound Margin: [8:Flat and Intact] [N/A:N/A] Granulation Amount: [8:Medium (34-66%)] [N/A:N/A] Granulation Quality: [8:Red, Pink] [N/A:N/A] Necrotic Amount: [8:Medium (34-66%)] [N/A:N/A] Exposed Structures: [8:Fat Layer (Subcutaneous Tissue) Exposed: Yes Fascia: No Tendon: No] [N/A:N/A] Muscle: No Joint: No Bone: No Epithelialization: None N/A N/A Debridement: Debridement - Excisional N/A N/A Pre-procedure 08:25 N/A N/A Verification/Time Out  Taken: Pain Control: Lidocaine 4% Topical Solution N/A N/A Tissue Debrided: Subcutaneous, Slough N/A N/A Level: Skin/Subcutaneous Tissue N/A N/A Debridement Area (sq cm): 20 N/A N/A Instrument: Curette N/A N/A Bleeding: Minimum N/A N/A Hemostasis Achieved: Pressure N/A N/A Procedural Pain: 0 N/A N/A Post Procedural Pain: 0 N/A N/A Debridement Treatment Procedure was tolerated well N/A N/A Response: Post Debridement 5x4x0.3 N/A N/A Measurements L x W x D (cm) Post Debridement Volume: 4.712 N/A N/A (cm) Periwound Skin Texture: Excoriation: No N/A N/A Induration: No Callus: No Crepitus: No Rash: No Scarring: No Periwound Skin Moisture: Maceration: Yes N/A N/A Dry/Scaly: No Periwound Skin Color: Atrophie Blanche: No N/A N/A Cyanosis: No Ecchymosis: No Erythema: No Hemosiderin Staining: No Mottled: No Pallor: No Rubor: No Temperature: No Abnormality N/A N/A Tenderness on Palpation: Yes N/A N/A Wound Preparation: Ulcer Cleansing: N/A N/A Rinsed/Irrigated with Saline, Other: soap and water Topical Anesthetic Applied: Other: lidocaine 4% Procedures Performed: Debridement N/A N/A Treatment Notes Electronic Signature(s) Signed: 12/20/2017 8:39:01 AM By: Lawanda Cousins Entered By: Lawanda Cousins on 12/20/2017 08:39:01 Lyday, [name] ([id number]) -------------------------------------------------------------------------------- Multi-Disciplinary Care Plan Details Patient Name: [name] Date of Service: 12/20/2017 8:00 AM Medical Record Number: [id number] Patient Account Number: [id number] Date of Birth/Sex: April 14, 1951 (67 y.o. M) Treating RN: Ahmed Prima Primary Care Amarie Tarte: Royetta Crochet Other Clinician: Referring Avaiah Stempel: Royetta Crochet Treating Harshitha Fretz/Extender: Cathie Olden in Treatment: 8 Active Inactive ` Abuse / Safety / Falls / Self Care Management Nursing Diagnoses: Potential for falls Goals: Patient will not experience any injury  related to falls Date Initiated: 10/25/2017 Target Resolution Date: 02/18/2018 Goal Status: Active Interventions: Assess Activities of Daily Living upon admission and as needed Assess fall risk on admission and as needed Assess: immobility, friction, shearing, incontinence upon admission and as needed Assess impairment of mobility on admission and as needed per policy Assess personal safety and home safety (as indicated) on admission and as needed Assess self care needs on admission and as needed Notes: ` Nutrition Nursing Diagnoses: Imbalanced nutrition Impaired glucose control: actual or potential Potential for alteratiion in Nutrition/Potential for imbalanced nutrition Goals: Patient/caregiver agrees to and verbalizes understanding of need to use nutritional supplements and/or vitamins as prescribed Date Initiated: 10/25/2017 Target Resolution Date: 02/18/2018 Goal Status: Active Patient/caregiver will maintain therapeutic glucose control Date Initiated: 10/25/2017 Target Resolution Date: 02/18/2018 Goal Status: Active Interventions: Assess patient nutrition upon admission and as needed per policy Provide education on elevated blood sugars and impact on wound healing Provide education on nutrition GILL, DELROSSI ([id number]) Notes: ` Orientation to the Wound Care Program Nursing Diagnoses: Knowledge deficit related to the wound healing center program Goals: Patient/caregiver will verbalize understanding of the [facility] Date Initiated: 10/25/2017 Target Resolution Date: 11/19/2017 Goal Status: Active Interventions: Provide education on orientation to the wound center Notes: ` Wound/Skin Impairment Nursing Diagnoses: Impaired  tissue integrity Knowledge deficit related to ulceration/compromised skin integrity Goals: Ulcer/skin breakdown will have a volume reduction of 80% by week 12 Date Initiated: 10/25/2017 Target Resolution Date: 02/11/2018 Goal  Status: Active Interventions: Assess patient/caregiver ability to perform ulcer/skin care regimen upon admission and as needed Assess ulceration(s) every visit Notes: Electronic Signature(s) Signed: 12/20/2017 3:52:40 PM By: Alric Quan Entered By: Alric Quan on 12/20/2017 08:25:44 Roberti, [name] ([id number]) -------------------------------------------------------------------------------- Pain Assessment Details Patient Name: [name] Date of Service: 12/20/2017 8:00 AM Medical Record Number: [id number] Patient Account Number: [id number] Date of Birth/Sex: [birth year] (67 y.o. M) Treating RN: Roger Shelter Primary Care Melina Mosteller: Royetta Crochet Other Clinician: Referring Terrisa Curfman: Royetta Crochet Treating Jathen Sudano/Extender: Cathie Olden in Treatment: 8 Active Problems Location of Pain Severity and Description of Pain Patient Has Paino No Site Locations Pain Management and Medication Current Pain Management: Electronic Signature(s) Signed: 12/20/2017 8:47:36 AM By: Roger Shelter Entered By: Roger Shelter on 12/20/2017 08:08:31 Sopher, [name] ([id number]) -------------------------------------------------------------------------------- Patient/Caregiver Education Details Patient Name: [name] Date of Service: 12/20/2017 8:00 AM Medical Record Number: [id number] Patient Account Number: [id number] Date of Birth/Gender: [birth year] (67 y.o. M) Treating RN: Roger Shelter Primary Care Physician: Royetta Crochet Other Clinician: Referring Physician: Royetta Crochet Treating Physician/Extender: Cathie Olden in Treatment: 8 Education Assessment Education Provided To: Patient Education Topics Provided Wound/Skin Impairment: Handouts: Caring for Your Ulcer Methods: Explain/Verbal Responses: State content correctly Electronic Signature(s) Signed: 12/20/2017 8:47:36 AM By: Roger Shelter Entered By: Roger Shelter on 12/20/2017  08:42:26 Tamez, [name] ([id number]) -------------------------------------------------------------------------------- Wound Assessment Details Patient Name: [name] Date of Service: 12/20/2017 8:00 AM Medical Record Number: [id number] Patient Account Number: [id number] Date of Birth/Sex: 02-Jul-1950 (67 y.o. M) Treating RN: Roger Shelter Primary Care Atreus Hasz: Royetta Crochet Other Clinician: Referring Meghna Hagmann: Royetta Crochet Treating Zakiya Sporrer/Extender: Cathie Olden in Treatment: 8 Wound Status Wound Number: 8 Primary Diabetic Wound/Ulcer of the Lower Extremity Etiology: Wound Location: Right Lower Leg - Medial, Proximal Secondary Lymphedema Wounding Event: Gradually Appeared Etiology: Date Acquired: 10/17/2017 Wound Open Weeks Of Treatment: 8 Status: Clustered Wound: No Comorbid Chronic Obstructive Pulmonary Disease History: (COPD), Arrhythmia, Deep Vein Thrombosis, Hypertension, Type II Diabetes, Gout, Neuropathy Photos Photo Uploaded By: Roger Shelter on 12/20/2017 08:47:11 Wound Measurements Length: (cm) 5 Width: (cm) 4 Depth: (cm) 0.2 Area: (cm) 15.708 Volume: (cm) 3.142 % Reduction in Area: -9420% % Reduction in Volume: -19537.5% Epithelialization: None Tunneling: No Undermining: No Wound Description Classification: Grade 2 Wound Margin: Flat and Intact Exudate Amount: Large Exudate Type: Serosanguineous Exudate Color: red, brown Foul Odor After Cleansing: Yes Due to Product Use: No Slough/Fibrino Yes Wound Bed Granulation Amount: Medium (34-66%) Exposed Structure Granulation Quality: Red, Pink Fascia Exposed: No Necrotic Amount: Medium (34-66%) Fat Layer (Subcutaneous Tissue) Exposed: Yes Necrotic Quality: Adherent Slough Tendon Exposed: No Muscle Exposed: No Joint Exposed: No Boak, Philander ([id number]) Bone Exposed: No Periwound Skin Texture Texture Color No Abnormalities Noted: No No Abnormalities Noted:  No Callus: No Atrophie Blanche: No Crepitus: No Cyanosis: No Excoriation: No Ecchymosis: No Induration: No Erythema: No Rash: No Hemosiderin Staining: No Scarring: No Mottled: No Pallor: No Moisture Rubor: No No Abnormalities Noted: No Dry / Scaly: No Temperature / Pain Maceration: Yes Temperature: No Abnormality Tenderness on Palpation: Yes Wound Preparation Ulcer Cleansing: Rinsed/Irrigated with Saline, Other: soap and water, Topical Anesthetic Applied: Other: lidocaine 4%, Treatment Notes Wound #8 (Right, Proximal, Medial Lower Leg) 1. Cleansed with: Clean wound with Normal Saline 2. Anesthetic Topical Lidocaine 4% cream to wound bed prior to debridement 3. Peri-wound  Care: Moisturizing lotion 4. Dressing Applied: Iodoflex 5. Secondary Dressing Applied ABD Pad 7. Secured with 3 Layer Compression System - Right Lower Extremity Notes unna to anchor Electronic Signature(s) Signed: 12/20/2017 8:47:36 AM By: Roger Shelter Entered By: Roger Shelter on 12/20/2017 08:16:29 Koziol, [name] ([id number]) -------------------------------------------------------------------------------- Vitals Details Patient Name: [name] Date of Service: 12/20/2017 8:00 AM Medical Record Number: [id number] Patient Account Number: [id number] Date of Birth/Sex: 11-04-1950 (67 y.o. M) Treating RN: Roger Shelter Primary Care Denece Shearer: Royetta Crochet Other Clinician: Referring Kasandra Fehr: Royetta Crochet Treating Lillyanne Bradburn/Extender: Cathie Olden in Treatment: 8 Vital Signs Time Taken: 08:08 Temperature (F): 97.7 Height (in): 74 Pulse (bpm): 80 Weight (lbs): 236 Respiratory Rate (breaths/min): 18 Body Mass Index (BMI): 30.3 Blood Pressure (mmHg): 132/88 Reference Range: 80 - 120 mg / dl Electronic Signature(s) Signed: 12/20/2017 8:47:36 AM By: Roger Shelter Entered By: Roger Shelter on 12/20/2017 08:09:49

## 2018-01-03 ENCOUNTER — Encounter: Payer: Medicare Other | Admitting: Physician Assistant

## 2018-01-03 DIAGNOSIS — E11622 Type 2 diabetes mellitus with other skin ulcer: Secondary | ICD-10-CM | POA: Diagnosis not present

## 2018-01-06 NOTE — Progress Notes (Signed)
[name], [name] ([id number]) Visit Report for 12/26/2017 Arrival Information Details Patient Name: [name], [name] Date of Service: 12/26/2017 10:00 AM Medical Record Number: [id number] Patient Account Number: [id number] Date of Birth/Sex: 04-23-1951 (67 y.o. M) Treating RN: Ahmed Prima Primary Care Tracy Gerken: Royetta Crochet Other Clinician: Referring Lawan Nanez: Royetta Crochet Treating Kannan Proia/Extender: Melburn Hake, HOYT Weeks in Treatment: 8 Visit Information History Since Last Visit All ordered tests and consults were completed: No Patient Arrived: Ambulatory Added or deleted any medications: No Arrival Time: 10:00 Any new allergies or adverse reactions: No Accompanied By: self Had a fall or experienced change in No Transfer Assistance: None activities of daily living that may affect Patient Identification Verified: Yes risk of falls: Secondary Verification Process Yes Signs or symptoms of abuse/neglect since last visito No Completed: Hospitalized since last visit: No Patient Requires Transmission-Based No Implantable device outside of the clinic excluding No Precautions: cellular tissue based products placed in the center Patient Has Alerts: Yes since last visit: Patient Alerts: Patient on Blood Has Dressing in Place as Prescribed: Yes Thinner Has Compression in Place as Prescribed: Yes DMII warfarin Pain Present Now: Yes Electronic Signature(s) Signed: 12/26/2017 11:34:14 AM By: Alric Quan Entered By: Alric Quan on 12/26/2017 10:01:04 [name], [name] ([id number]) -------------------------------------------------------------------------------- Encounter Discharge Information Details Patient Name: [name] Date of Service: 12/26/2017 10:00 AM Medical Record Number: [id number] Patient Account Number: [id number] Date of Birth/Sex: 11-15-1950 (67 y.o. M) Treating RN: Ahmed Prima Primary Care Karo Rog: Royetta Crochet Other  Clinician: Referring Tori Cupps: Royetta Crochet Treating Loucille Takach/Extender: Melburn Hake, HOYT Weeks in Treatment: 8 Encounter Discharge Information Items Discharge Condition: Stable Ambulatory Status: Ambulatory Discharge Destination: Home Transportation: Private Auto Accompanied By: self Schedule Follow-up Appointment: Yes Clinical Summary of Care: Electronic Signature(s) Signed: 12/26/2017 11:34:14 AM By: Alric Quan Entered By: Alric Quan on 12/26/2017 10:07:53 [name], [name] ([id number]) -------------------------------------------------------------------------------- Patient/Caregiver Education Details Patient Name: [name] Date of Service: 12/26/2017 10:00 AM Medical Record Number: [id number] Patient Account Number: [id number] Date of Birth/Gender: Nov 30, 1950 (67 y.o. M) Treating RN: Ahmed Prima Primary Care Physician: Royetta Crochet Other Clinician: Referring Physician: Royetta Crochet Treating Physician/Extender: Sharalyn Ink in Treatment: 8 Education Assessment Education Provided To: Patient Education Topics Provided Wound/Skin Impairment: Handouts: Caring for Your Ulcer, Skin Care Do's and Dont's, Other: change dressing as ordered Methods: Demonstration, Explain/Verbal Responses: State content correctly Electronic Signature(s) Signed: 12/26/2017 11:34:14 AM By: Alric Quan Entered By: Alric Quan on 12/26/2017 10:07:45 [name], [name] ([id number]) -------------------------------------------------------------------------------- Wound Assessment Details Patient Name: [name] Date of Service: 12/26/2017 10:00 AM Medical Record Number: [id number] Patient Account Number: [id number] Date of Birth/Sex: [birth year] (67 y.o. M) Treating RN: Ahmed Prima Primary Care Zoelle Markus: Royetta Crochet Other Clinician: Referring Terran Hollenkamp: Royetta Crochet Treating Caydon Feasel/Extender: STONE III, HOYT Weeks in Treatment: 8 Wound  Status Wound Number: 8 Primary Diabetic Wound/Ulcer of the Lower Extremity Etiology: Wound Location: Right Lower Leg - Medial, Proximal Secondary Lymphedema Wounding Event: Gradually Appeared Etiology: Date Acquired: 10/17/2017 Wound Open Weeks Of Treatment: 8 Status: Clustered Wound: No Comorbid Chronic Obstructive Pulmonary Disease History: (COPD), Arrhythmia, Deep Vein Thrombosis, Hypertension, Type II Diabetes, Gout, Neuropathy Photos Photo Uploaded By: Alric Quan on 12/26/2017 10:41:43 Wound Measurements Length: (cm) 4.8 % Reduct Width: (cm) 2.6 % Reduct Depth: (cm) 0.2 Epitheli Area: (cm) 9.802 Tunneli Volume: (cm) 1.96 Undermi ion in Area: -5840.6% ion in Volume: -12150% alization: None ng: No ning: No Wound Description Classification: Grade 2 Wound Margin: Flat and Intact Exudate Amount: Large Exudate Type: Purulent Exudate Color: yellow, brown, green Foul Odor  After Cleansing: Yes Due to Product Use: No Slough/Fibrino Yes Wound Bed Granulation Amount: Large (67-100%) Exposed Structure Granulation Quality: Red, Pink Fascia Exposed: No Necrotic Amount: Small (1-33%) Fat Layer (Subcutaneous Tissue) Exposed: Yes Necrotic Quality: Eschar, Adherent Slough Tendon Exposed: No Muscle Exposed: No Joint Exposed: No [name], [name] ([id number]) Bone Exposed: No Periwound Skin Texture Texture Color No Abnormalities Noted: No No Abnormalities Noted: No Callus: No Atrophie Blanche: No Crepitus: No Cyanosis: No Excoriation: No Ecchymosis: No Induration: No Erythema: No Rash: No Hemosiderin Staining: No Scarring: Yes Mottled: No Pallor: No Moisture Rubor: No No Abnormalities Noted: No Dry / Scaly: No Temperature / Pain Maceration: Yes Temperature: No Abnormality Tenderness on Palpation: Yes Wound Preparation Ulcer Cleansing: Rinsed/Irrigated with Saline, Other: soap and water, Topical Anesthetic Applied: None Treatment Notes Wound #8  (Right, Proximal, Medial Lower Leg) 1. Cleansed with: Clean wound with Normal Saline Cleanse wound with antibacterial soap and water 3. Peri-wound Care: Moisturizing lotion 4. Dressing Applied: Iodoflex 5. Secondary Dressing Applied ABD Pad 7. Secured with Tape 3 Layer Compression System - Right Lower Extremity Notes unna to anchor Electronic Signature(s) Signed: 12/26/2017 11:34:14 AM By: Alric Quan Entered By: Alric Quan on 12/26/2017 10:06:56

## 2018-01-10 ENCOUNTER — Encounter: Payer: Medicare Other | Admitting: Physician Assistant

## 2018-01-10 DIAGNOSIS — E11622 Type 2 diabetes mellitus with other skin ulcer: Secondary | ICD-10-CM | POA: Diagnosis not present

## 2018-01-13 NOTE — Progress Notes (Signed)
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 and tells me that fortunately he is finally improvement. He is pleased with how this is progressing. SEANPAUL, PREECE ([id number]) 11/30/16; improved using Hydrofera Blue 12/07/16; continue dramatic progress. Wound is now small and healthy looking. Continue use of Hydrofera Blue 12/14/16; very small wound albeit with some depth. Continued use of Hydrofera blue. 12/21/16 on evaluation today patient's right lower extremity ulcer appears healed at this point. He is having no discomfort and overall this is doing very well. And there's no sign of infection. 04/26/17; READMISSION Patient we know from previous stays in this clinic. The patient has known diabetic PAD and has a stent in the right leg. 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 When he was last in the clinic in August we had healed him out for what was felt to be a mostly venous insufficiency wound on the medial right leg. He follows with vascular surgery Dr. dew is at [place] [facility]. He has previously undergone successful laser ablation. Reflux studies on 4/24 showed reflux in the common femoral, femoral and popliteal. He underwent successful ablation of the right greater saphenous vein from the distal thigh to the mid calf level. Successful ablation of the right small saphenous vein. He had unsuccessful ablation of the right greater saphenous vein from the saphenofemoral junction to the distal 5 level. The patient wears to let her compression stockings and he has been compliant with this With regards to his diabetes he is not currently on any treatment. He does describe pain in his leg which forces him to stop although I couldn't really quantify this. His last arterial studies were on 07/05/16 which showed triphasic waveforms on the right to the level of the popliteal artery. The right posterior tibial and anterior tibial artery were not visualized on this  study. I don't actually see ABIs or TBIs. 05/04/17; the patient has small wounds on the right medial heel and the right medial leg. The area on the foot is just about closed. The area on the right medial leg is improved. He is tolerating the 3 layer compression without any complaints. The patient has both known PAD and chronic venous reflux [see discussion above]. 05/11/17; the areas on the patient's medial right heel with healed he still has an open area on the right medial leg. This does not require debridement today I think I did read it last week. We are using silver collagen under 3 layer compression. 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 He has been tolerating the dressing changes without complication. With that being said he does have some epithelialization growing into the wound bed and it appears this wound may be trying to healed in a depressed fashion. To be honest that is not the worst case scenario and I think this may definitely do fine even if it heals in that way. There was no requirement for debridement today as patient appears to be doing very well  in regard to his ulcer. His swelling was a little bit more severe than last week but nothing too significant. 07/05/17 on evaluation today patient appears to be doing rather well in regard to his right medial lower extremity ulcer. The wound does appear to be healing although it is healing and somewhat of a cratered fashion the good news is he has excellent epithelium noted. Fortunately there does not appear to be any evidence of infection at this point he is tolerating the dressings as well is the wrap well. 07/12/17 on evaluation today patient appears to be doing very well in regard to his left medial lower extremity ulcer. He has been tolerating the dressing  changes without complication. In fact compared to last week this wound which was somewhat concave has fielding dramatically and looks to be doing excellent. I'm extremely happy with the progress. He still has pain rated to be a 4-5/10 but the wound looks great. 07/20/17; the patient's right medial lower extremity ulcer is closed. He has good edema control. Unfortunately he does not really have adequate compression stockings. 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 He has been treated with compression stockings for some time now in fact we have notes having treated him even as far back as 2016 here in our clinic. Fortunately he has excellent blood flow and tends to heal well. Unfortunately despite wearing his compression stockings, keeping his legs elevated, being on fluid pills (Lasix) and doing everything he can to try to help with fluid he still continues to have issues with this reopening. I think that he may be a good candidate for compression/lymphedema pumps. We have never attempted to get these for him previously. No fevers, chills, nausea, or vomiting noted at this time. Currently patient states his ulcer does hurt although not as bad as some in the past it's also not as large as it has been in the past when it reopened he talked is somewhat early. Obviously this is good news. NYLAN, NEVEL ([id number]) 11/01/17 on evaluation today patient appears to be doing better in regard to his lower extremity ulcer. He has been tolerating the wraps without complication. Overall things seem to be showing signs of good improvement which is excellent. That's what were looking for  obviously. He has always done well with the wraps in the past as well. Fortunately there does not appear to be any evidence of infection at this point in time. 11/08/17 on evaluation today patient appears to be doing rather okay in regard to his right medial malleolus ulcer. With that being said he still seems to have some fluid control issues at this point. With that being said I think we may want to switch from the silvercell to maybe Prisma to try to help this and prove more significantly as far as the moisture control is concerned. Overall I do think she's made some progress and I believe that we may be able to aid him along with just a few minor changes. His swelling seems to be doing well with the wrap. 11/15/17 on evaluation today patient's right medial malleolus ulcer appears to be very moist as far as drainage is concerned. It does appear  that the Drawtex was not as beneficial for him as we would've liked. He fortunately has no evidence of infection at this time unfortunately again he did have a lot of maceration noted at this point. His wound was a little bit larger. Otherwise there is no evidence of active infection at this point. 11/22/17 on evaluation today patient's wound actually is a little bit larger and more macerated today compared to previous. The dressings that were applying do seem to be helping to trap the fluid nonetheless the wound is a little bit larger and has spread. With that being said there still slough in the surface of the wound he's had a lot of increased pain therefore I'm trying to avoid sharp debridement today at the patient request. 11/29/17 on evaluation today patient appears to be doing a little better in my pinion in regard to his right medial ankle ulcer. Nonetheless this continues to be very macerated. We have been performing at one time a week dressing change I think this may not be benefiting him I think it may be possible that he would benefit from more  frequent dressing changes. At least another rat change once during the week. Nonetheless he is not opposed to this and I think we should consider setting him up with home health. On evaluation today patient appears to be doing rather poorly in regard to his right lower [place] ulcer. It really has not shown any signs of improvement unfortunately he does have an infection with pseudomonas. Nonetheless the doxycycline is not going to work for this we need to have them on something else I'm gonna go ahead and suggest that we get him on the appropriate antibiotic Cipro. He's in agreement this plan. Unfortunately has been having a lot more pain he states it pretty much hurts all the time this is understandable considering it is infected. 12/13/17 on evaluation today patient appears to actually be doing very well in regard to his right medial ankle ulcer compared to last week's evaluation. I think having him on the correct antibiotics has made all the difference in the world. With that being said he's not having discomfort like he was previous and in general I feel like he has made excellent strides towards healing in the past week. No fevers, chills, nausea, or vomiting noted at this time. 12/20/17-He is seen in follow-up evaluation for a right medial lower extremity wound. There is improvement. He has completed the Cipro with no GI disturbance. We will continue with the same treatment plan and he will follow-up next week 01/03/18 on evaluation today patient appears to be doing rather well in my pinion in regard to his right medial lower extremity ulcer. He's been tolerating the dressing changes without complication the wrap has been causing a little bit of dry skin irritation at this point. We may want to initiate treatment with triamcinolone along with lotion to help with dissertation. Other than this overall the patient seems to be doing better as far as the wound itself is concerned in general  appearance. Patient History Information obtained from Patient. Family History Heart Disease - Mother,Father, Hypertension - Mother,Father, Stroke - Father, No family history of Cancer, Diabetes, Hereditary Spherocytosis, Thyroid Problems, Tuberculosis. Social History Current every day smoker, Marital Status - Single, Alcohol Use - Never, Drug Use - No History, Caffeine Use - Daily. Medical History Hospitalization/Surgery History - 05/17/2012, [facility], CVA. - 03/17/2017, [facility], Pneumonia. SREEKAR, BROYHILL ([id number]) Medical And Surgical History Notes Ear/Nose/Mouth/Throat difficulty speaking Respiratory Hospitalized  for Pneumonia 02/2017 Neurologic CVA in 2014 Review of Systems (ROS) Constitutional Symptoms (General Health) Denies complaints or symptoms of Fever, Chills. Respiratory The patient has no complaints or symptoms. Cardiovascular Complains or has symptoms of LE edema. Psychiatric The patient has no complaints or symptoms. Objective Constitutional Well-nourished and well-hydrated in no acute distress. Vitals Time Taken: 8:08 AM, Height: 74 in, Weight: 236 lbs, BMI: 30.3, Temperature: 97.6 F, Pulse: 55 bpm, Respiratory Rate: 18 breaths/min, Blood Pressure: 154/76 mmHg. Respiratory normal breathing without difficulty. Cardiovascular 1+ pitting edema of the bilateral lower extremities. Psychiatric this patient is able to make decisions and demonstrates good insight into disease process. Alert and Oriented x 3. pleasant and cooperative. General Notes: Patient's wound bed shows evidence of good improvement as far as the overall quality of the surface is concerned he does still have some Slough that did require sharp debridement today. The good news is post debridement the wound bed seems to be showing signs of improving which is excellent news. Integumentary (Hair, Skin) Wound #8 status is Open. Original cause of wound was Gradually Appeared. The wound is located on  the Right,Proximal,Medial Lower Leg. The wound measures 4.9cm length x 2.6cm width x 0.3cm depth; 10.006cm^2 area and 3.002cm^3 volume. There is Fat Layer (Subcutaneous Tissue) Exposed exposed. There is no tunneling or undermining noted. There is a large amount of purulent drainage noted. Foul odor after cleansing was noted. The wound margin is flat and intact. There is large (67-100%) red, pink granulation within the wound bed. There is a small (1-33%) amount of necrotic tissue within the wound bed including Eschar and Adherent Slough. The periwound skin appearance exhibited: Scarring, Maceration. The periwound skin appearance did not exhibit: Callus, Crepitus, Excoriation, Induration, Rash, Dry/Scaly, Atrophie Blanche, Cyanosis, Ecchymosis, Hemosiderin Staining, Mottled, Pallor, Rubor, Erythema. Periwound temperature Westrup, Dimitry ([id number]) was noted as No Abnormality. The periwound has tenderness on palpation. 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 There was a Excisional Skin/Subcutaneous Tissue Debridement with a total area of 12.74 sq cm performed by STONE III, HOYT E., PA-C. With the following instrument(s): Curette to remove Viable and Non-Viable tissue/material. Material removed includes Subcutaneous Tissue, Slough, and Fibrin/Exudate after achieving pain control using Other (lidocaine 4%). No specimens were taken. A time out was conducted at 08:33, prior to the start of the procedure. A Minimum amount of bleeding was controlled with Pressure. The procedure  was tolerated well with a pain level of 0 throughout and a pain level of 0 following the procedure. Patient s Level of Consciousness post procedure was recorded as Awake and Alert. Post Debridement Measurements: 4.9cm length x 2.6cm width x 0.4cm depth; 4.002cm^3 volume. Character of Wound/Ulcer Post Debridement is stable. Severity of Tissue Post Debridement is: Fat layer exposed. Post procedure Diagnosis Wound #8: Same as Pre-Procedure Plan Wound Cleansing: Wound #8 Right,Proximal,Medial Lower Leg: Clean wound with Normal Saline. Cleanse wound with mild soap and water Anesthetic (add to Medication List): Wound #8 Right,Proximal,Medial Lower Leg: Topical Lidocaine 4% cream applied to wound bed prior to debridement (In Clinic Only). Skin Barriers/Peri-Wound Care: Wound #8 Right,Proximal,Medial Lower Leg: Moisturizing lotion - not on wound area Triamcinolone Acetonide Ointment (  TCA) Primary Wound Dressing: Wound #8 Right,Proximal,Medial Lower Leg: Iodoflex Secondary Dressing: Wound #8 Right,Proximal,Medial Lower Leg: Kolker, Delvin ([id number]) ABD pad Dry Gauze Dressing Change Frequency: Wound #8 Right,Proximal,Medial Lower Leg: Change dressing every week Follow-up Appointments: Wound #8 Right,Proximal,Medial Lower Leg: Return Appointment in 1 week. Nurse Visit as needed Edema Control: Wound #8 Right,Proximal,Medial Lower Leg: 3 Layer Compression System - Right Lower Extremity - unna to anchor Other: - order bilateral compression pumps Additional Orders / Instructions: Wound #8 Right,Proximal,Medial Lower Leg: Increase protein intake. The following medication(s) was prescribed: lidocaine topical 4 % cream 1 1 cream topical was prescribed at facility I'm gonna suggest currently that we actually go ahead and continue with the above wound care orders for the next week. He's in agreement the plan. He is concerned about the fact that the wound is somewhat deeper I explained  again that this is due to the debridement getting down to good tissue. With that being said it's only minimally deeper compared to previous. Overall I think he is making really good progress. Please see above for specific wound care orders. We will see patient for re-evaluation in 1 week(s) here in the clinic. If anything worsens or changes patient will contact our office for additional recommendations. Electronic Signature(s) Signed: 01/04/2018 12:11:53 PM By: Worthy Keeler PA-C Entered By: Worthy Keeler on 01/03/2018 08:41:28 Pettaway, [name] ([id number]) -------------------------------------------------------------------------------- ROS/PFSH Details Patient Name: [name] Date of Service: 01/03/2018 8:00 AM Medical Record Number: [id number] Patient Account Number: [id number] Date of Birth/Sex: Jun 23, 1950 (67 y.o. M) Treating RN: Ahmed Prima Primary Care Provider: Royetta Crochet Other Clinician: Referring Provider: Royetta Crochet Treating Provider/Extender: STONE III, HOYT Weeks in Treatment: 10 Information Obtained From Patient Wound History Do you currently have one or more open woundso Yes How many open wounds do you currently haveo 1 Approximately how long have you had your woundso 1 week How have you been treating your wound(s) until nowo lotion Has your wound(s) ever healed and then re-openedo Yes Have you had any lab work done in the past montho No Have you tested positive for an antibiotic resistant organism (MRSA, VRE)o No Have you tested positive for osteomyelitis (bone infection)o No Have you had any tests for circulation on your legso Yes Who ordered the testo [facility] Where was the test doneo [facility] Constitutional Symptoms (General Health) Complaints and Symptoms: Negative for: Fever; Chills Cardiovascular Complaints and Symptoms: Positive for: LE edema Medical History: Positive for: Arrhythmia - a fib; Deep Vein Thrombosis;  Hypertension Negative for: Angina; Congestive Heart Failure; Coronary Artery Disease; Hypotension; Myocardial Infarction; Peripheral Arterial Disease; Peripheral Venous Disease; Phlebitis; Vasculitis Eyes Medical History: Negative for: Cataracts; Glaucoma; Optic Neuritis Ear/Nose/Mouth/Throat Medical History: Negative for: Chronic sinus problems/congestion; Middle ear problems Past Medical History Notes: difficulty speaking Hematologic/Lymphatic Medical History: Negative for: Anemia; Hemophilia; Human Immunodeficiency Virus; Lymphedema; Sickle Cell Disease Respiratory Jurich, Warner ([id number]) Complaints and Symptoms: No Complaints or Symptoms Medical History: Positive for: Chronic Obstructive Pulmonary Disease (COPD) Negative for: Aspiration; Asthma; Pneumothorax; Sleep Apnea; Tuberculosis Past Medical History Notes: Hospitalized for Pneumonia 02/2017 Gastrointestinal Medical History: Negative for: Cirrhosis ; Colitis; Crohnos; Hepatitis A; Hepatitis B; Hepatitis C Endocrine Medical History: Positive for: Type II Diabetes Negative for: Type I Diabetes Time with diabetes: since 2017 Treated with: Diet Blood sugar tested every day: No Genitourinary Medical History: Negative for: End Stage Renal Disease Immunological Medical History: Negative for: Lupus Erythematosus; Raynaudos; Scleroderma Integumentary (Skin) Medical History: Negative for: History of Burn; History of pressure wounds  Musculoskeletal Medical History: Positive for: Gout Negative for: Rheumatoid Arthritis; Osteoarthritis; Osteomyelitis Neurologic Medical History: Positive for: Neuropathy Negative for: Dementia; Quadriplegia; Paraplegia; Seizure Disorder Past Medical History Notes: CVA in 2014 Oncologic Medical History: Negative for: Received Chemotherapy; Received Radiation Psychiatric VALERY, CHANCE ([id number]) Complaints and Symptoms: No Complaints or Symptoms Medical  History: Negative for: Anorexia/bulimia; Confinement Anxiety Immunizations Pneumococcal Vaccine: Received Pneumococcal Vaccination: Yes Immunization Notes: up to date Implantable Devices Hospitalization / Surgery History Name of Hospital Purpose of Hospitalization/Surgery Date [facility] CVA 05/17/2012 [facility] Pneumonia 03/17/2017 Family and Social History Cancer: No; Diabetes: No; Heart Disease: Yes - Mother,Father; Hereditary Spherocytosis: No; Hypertension: Yes - Mother,Father; Stroke: Yes - Father; Thyroid Problems: No; Tuberculosis: No; Current every day smoker; Marital Status - Single; Alcohol Use: Never; Drug Use: No History; Caffeine Use: Daily; Financial Concerns: No; Food, Clothing or Shelter Needs: No; Support System Lacking: No; Transportation Concerns: No; Advanced Directives: No; Patient does not want information on Advanced Directives Physician Affirmation I have reviewed and agree with the above information. Electronic Signature(s) Signed: 01/04/2018 12:11:53 PM By: Worthy Keeler PA-C Signed: 01/04/2018 4:43:18 PM By: Alric Quan Entered By: Worthy Keeler on 01/03/2018 08:40:50 Midgett, [name] ([id number]) -------------------------------------------------------------------------------- SuperBill Details Patient Name: [name] Date of Service: 01/03/2018 Medical Record Number: [id number] Patient Account Number: [id number] Date of Birth/Sex: 1950-10-17 (67 y.o. M) Treating RN: Ahmed Prima Primary Care Provider: Royetta Crochet Other Clinician: Referring Provider: Royetta Crochet Treating Provider/Extender: Melburn Hake, HOYT Weeks in Treatment: 10 Diagnosis Coding ICD-10 Codes Code Description I87.2 Venous insufficiency (chronic) (peripheral) I89.0 Lymphedema, not elsewhere classified E11.622 Type 2 diabetes mellitus with other skin ulcer L97.812 Non-pressure chronic ulcer of other part of right lower leg with fat layer exposed Facility Procedures CPT4  Code Description: 13643837 11042 - DEB SUBQ TISSUE 20 SQ CM/< ICD-10 Diagnosis Description R93.968 Non-pressure chronic ulcer of other part of right lower leg wit Modifier: h fat layer expo Quantity: 1 sed Physician Procedures CPT4 Code Description: 8648472 11042 - WC PHYS SUBQ TISS 20 SQ CM ICD-10 Diagnosis Description W72.182 Non-pressure chronic ulcer of other part of right lower leg wit Modifier: h fat layer expo Quantity: 1 sed Electronic Signature(s) Signed: 01/04/2018 12:11:53 PM By: Worthy Keeler PA-C Entered By: Worthy Keeler on 01/03/2018 08:41:38

## 2018-01-13 NOTE — Progress Notes (Signed)
WENDELIN, BRADT ([id number]) Visit Report for 01/03/2018 Arrival Information Details Patient Name: WATT, GEILER Date of Service: 01/03/2018 8:00 AM Medical Record Number: [id number] Patient Account Number: [id number] Date of Birth/Sex: 04/05/1951 (67 y.o. M) Treating RN: Montey Hora Primary Care Whittley Carandang: Royetta Crochet Other Clinician: Referring Trayveon Beckford: Royetta Crochet Treating Monee Dembeck/Extender: Melburn Hake, HOYT Weeks in Treatment: 10 Visit Information History Since Last Visit Added or deleted any medications: No Patient Arrived: Ambulatory Any new allergies or adverse reactions: No Arrival Time: 08:08 Had a fall or experienced change in No Accompanied By: self activities of daily living that may affect Transfer Assistance: None risk of falls: Patient Identification Verified: Yes Signs or symptoms of abuse/neglect since last visito No Secondary Verification Process Yes Hospitalized since last visit: No Completed: Implantable device outside of the clinic excluding No Patient Requires Transmission-Based No cellular tissue based products placed in the center Precautions: since last visit: Patient Has Alerts: Yes Has Dressing in Place as Prescribed: Yes Patient Alerts: Patient on Blood Has Compression in Place as Prescribed: Yes Thinner Pain Present Now: No DMII warfarin Electronic Signature(s) Signed: 01/03/2018 5:07:08 PM By: Montey Hora Entered By: Montey Hora on 01/03/2018 08:08:26 Mccoll, [name] ([id number]) -------------------------------------------------------------------------------- Encounter Discharge Information Details Patient Name: [name] Date of Service: 01/03/2018 8:00 AM Medical Record Number: [id number] Patient Account Number: [id number] Date of Birth/Sex: August 06, 1950 (67 y.o. M) Treating RN: Roger Shelter Primary Care Anderson Middlebrooks: Royetta Crochet Other Clinician: Referring Lian Tanori: Royetta Crochet Treating  Stellan Vick/Extender: Melburn Hake, HOYT Weeks in Treatment: 10 Encounter Discharge Information Items Discharge Condition: Stable Ambulatory Status: Ambulatory Discharge Destination: Home Transportation: Private Auto Schedule Follow-up Appointment: Yes Clinical Summary of Care: Electronic Signature(s) Signed: 01/03/2018 4:53:55 PM By: Roger Shelter Entered By: Roger Shelter on 01/03/2018 08:49:52 Mcclintock, [name] ([id number]) -------------------------------------------------------------------------------- Lower Extremity Assessment Details Patient Name: [name] Date of Service: 01/03/2018 8:00 AM Medical Record Number: [id number] Patient Account Number: [id number] Date of Birth/Sex: 25-Dec-1950 (67 y.o. M) Treating RN: Montey Hora Primary Care Campbell Kray: Royetta Crochet Other Clinician: Referring Griff Badley: Royetta Crochet Treating Rhylan Kagel/Extender: Melburn Hake, HOYT Weeks in Treatment: 10 Edema Assessment Assessed: [Left: No] [Right: No] E[Left: dema] [Right: :] Calf Left: Right: Point of Measurement: 40 cm From Medial Instep cm 44 cm Ankle Left: Right: Point of Measurement: 14 cm From Medial Instep cm 29.5 cm Vascular Assessment Pulses: Dorsalis Pedis Palpable: [Right:Yes] Posterior Tibial Extremity colors, hair growth, and conditions: Extremity Color: [Right:Hyperpigmented] Hair Growth on Extremity: [Right:No] Temperature of Extremity: [Right:Warm] Capillary Refill: [Right:< 3 seconds] Toe Nail Assessment Left: Right: Thick: Yes Discolored: Yes Deformed: Yes Improper Length and Hygiene: No Electronic Signature(s) Signed: 01/03/2018 5:07:08 PM By: Montey Hora Entered By: Montey Hora on 01/03/2018 08:15:33 Tamer, [name] ([id number]) -------------------------------------------------------------------------------- Multi Wound Chart Details Patient Name: [name] Date of Service: 01/03/2018 8:00 AM Medical Record Number:  [id number] Patient Account Number: [id number] Date of Birth/Sex: August 05, 1950 (67 y.o. 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] [N/A:N/A] Secondary Etiology: [8:Lymphedema] [N/A:N/A] Comorbid History: [8:Chronic Obstructive Pulmonary Disease (COPD), Arrhythmia, Deep Vein Thrombosis, Hypertension, Type II Diabetes, Gout, Neuropathy] [N/A:N/A] Date Acquired: [8:10/17/2017] [N/A:N/A] Weeks of Treatment: [8:10] [N/A:N/A] Wound Status: [8:Open] [N/A:N/A]  Measurements L x W x D [8:4.9x2.6x0.3] [N/A:N/A] (cm) Area (cm) : [8:10.006] [N/A:N/A] Volume (cm) : [8:3.002] [N/A:N/A] % Reduction in Area: [8:-5964.20%] [N/A:N/A] % Reduction in Volume: [8:-18662.50%] [N/A:N/A] Classification: [8:Grade 2] [N/A:N/A] Exudate Amount: [8:Large] [N/A:N/A] Exudate Type: [8:Purulent] [N/A:N/A] Exudate Color: [8:yellow, brown, green] [N/A:N/A] Foul Odor After Cleansing: [8:Yes] [N/A:N/A] Odor Anticipated Due to [8:No] [N/A:N/A] Product Use: Wound Margin: [8:Flat and Intact] [N/A:N/A] Granulation Amount: [8:Large (67-100%)] [N/A:N/A] Granulation Quality: [8:Red, Pink] [N/A:N/A] Necrotic Amount: [8:Small (1-33%)] [N/A:N/A] Necrotic Tissue: [8:Eschar, Adherent Slough] [N/A:N/A] Exposed Structures: [8:Fat Layer (Subcutaneous Tissue) Exposed: Yes Fascia: No] [N/A:N/A] Tendon: No Muscle: No Joint: No Bone: No Epithelialization: None N/A N/A Periwound Skin Texture: Scarring: Yes  N/A N/A Excoriation: No Induration: No Callus: No Crepitus: No Rash: No Periwound Skin Moisture: Maceration: Yes N/A N/A Dry/Scaly: No Periwound Skin Color: Atrophie Blanche: No N/A N/A Cyanosis: No Ecchymosis: No Erythema: No Hemosiderin Staining: No Mottled: No Pallor: No Rubor: No Temperature: No Abnormality N/A N/A Tenderness on Palpation: Yes N/A N/A Wound Preparation: Ulcer Cleansing: N/A N/A Rinsed/Irrigated with Saline, Other: soap and water Topical Anesthetic Applied: Other: lidocaine 4% Treatment Notes Electronic Signature(s) Signed: 01/04/2018 4:43:18 PM By: Alric Quan Entered By: Alric Quan on 01/03/2018 08:32:31 Brinkmeier, [name] ([id number]) -------------------------------------------------------------------------------- [place] Details Patient Name: [name] Date of Service: 01/03/2018 8:00 AM Medical Record Number: [id number] Patient Account Number: [id number] Date of Birth/Sex: Oct 09, 1950 (67 y.o. M) Treating RN: Ahmed Prima Primary Care Dezirae Service: Royetta Crochet Other Clinician: Referring Montie Gelardi: Royetta Crochet Treating Roanne Haye/Extender: Melburn Hake, HOYT Weeks in Treatment: 10 Active Inactive ` Abuse / Safety / Falls / Self Care Management Nursing Diagnoses: Potential for falls Goals: Patient will not experience any injury related to falls Date Initiated: 10/25/2017 Target Resolution Date: 02/18/2018 Goal Status: Active Interventions: Assess Activities of Daily Living upon admission and as needed Assess fall risk on admission and as needed Assess: immobility, friction, shearing, incontinence upon admission and as needed Assess impairment of mobility on admission and as needed per policy Assess personal safety and home safety (as indicated) on admission and as needed Assess self care needs on admission and as needed Notes: ` Nutrition Nursing Diagnoses: Imbalanced nutrition Impaired glucose  control: actual or potential Potential for alteratiion in Nutrition/Potential for imbalanced nutrition Goals: Patient/caregiver agrees to and verbalizes understanding of need to use nutritional supplements and/or vitamins as prescribed Date Initiated: 10/25/2017 Target Resolution Date: 02/18/2018 Goal Status: Active Patient/caregiver will maintain therapeutic glucose control Date Initiated: 10/25/2017 Target Resolution Date: 02/18/2018 Goal Status: Active Interventions: Assess patient nutrition upon admission and as needed per policy Provide education on elevated blood sugars and impact on wound healing Provide education on nutrition ALEJANDRA, BARNA ([id number]) Notes: ` Orientation to the Wound Care Program Nursing Diagnoses: Knowledge deficit related to the wound healing center program Goals: Patient/caregiver will verbalize understanding of the [place] Program Date Initiated: 10/25/2017 Target Resolution Date: 11/19/2017 Goal Status: Active Interventions: Provide education on orientation to the wound center Notes: ` Wound/Skin Impairment Nursing Diagnoses: Impaired tissue integrity Knowledge deficit related to ulceration/compromised skin integrity Goals: Ulcer/skin breakdown will have a volume reduction of 80% by week 12 Date Initiated: 10/25/2017 Target Resolution Date: 02/11/2018 Goal Status: Active Interventions: Assess patient/caregiver ability to perform ulcer/skin care regimen upon admission and as needed Assess ulceration(s) every visit Notes: Electronic Signature(s) Signed: 01/04/2018 4:43:18 PM By: Alric Quan Entered By: Alric Quan on 01/03/2018 08:32:24 Kohlman, [name] ([id number]) -------------------------------------------------------------------------------- Pain Assessment Details Patient Name: [name] Date of Service: 01/03/2018 8:00  AM Medical Record Number: [id number] Patient Account Number: [id number] Date of  Birth/Sex: 1950/06/07 (67 y.o. M) Treating RN: Montey Hora Primary Care Zanna Hawn: Royetta Crochet Other Clinician: Referring Lasonia Casino: Royetta Crochet Treating Norvin Ohlin/Extender: STONE III, HOYT Weeks in Treatment: 10 Active Problems Location of Pain Severity and Description of Pain Patient Has Paino Yes Site Locations Pain Location: Pain in Ulcers With Dressing Change: Yes Duration of the Pain. Constant / Intermittento Constant Pain Management and Medication Current Pain Management: Electronic Signature(s) Signed: 01/03/2018 5:07:08 PM By: Montey Hora Entered By: Montey Hora on 01/03/2018 08:08:39 Davanzo, [name] ([id number]) -------------------------------------------------------------------------------- Patient/Caregiver Education Details Patient Name: [name] Date of Service: 01/03/2018 8:00 AM Medical Record Number: [id number] Patient Account Number: [id number] Date of Birth/Gender: [birth year] (67 y.o. M) Treating RN: Roger Shelter Primary Care Physician: Royetta Crochet Other Clinician: Referring Physician: Royetta Crochet Treating Physician/Extender: Sharalyn Ink in Treatment: 10 Education Assessment Education Provided To: Patient Education Topics Provided Wound Debridement: Handouts: Wound Debridement Methods: Explain/Verbal Responses: State content correctly Wound/Skin Impairment: Handouts: Caring for Your Ulcer Methods: Explain/Verbal Responses: State content correctly Electronic Signature(s) Signed: 01/03/2018 4:53:55 PM By: Roger Shelter Entered By: Roger Shelter on 01/03/2018 08:50:10 Dennard, [name] ([id number]) -------------------------------------------------------------------------------- Wound Assessment Details Patient Name: [name] Date of Service: 01/03/2018 8:00 AM Medical Record Number: [id number] Patient Account Number: [id number] Date of Birth/Sex: 1950-10-01 (67 y.o. M) Treating RN: Montey Hora Primary Care Kalicia Dufresne: Royetta Crochet Other Clinician: Referring Shamieka Gullo: Royetta Crochet Treating Mannie Ohlin/Extender: STONE III, HOYT Weeks in Treatment: 10 Wound Status Wound Number: 8 Primary Diabetic Wound/Ulcer of the Lower Extremity Etiology: Wound Location: Right Lower Leg - Medial, Proximal Secondary Lymphedema Wounding Event: Gradually Appeared Etiology: Date Acquired: 10/17/2017 Wound Open Weeks Of Treatment: 10 Status: Clustered Wound: No Comorbid Chronic Obstructive Pulmonary Disease History: (COPD), Arrhythmia, Deep Vein Thrombosis, Hypertension, Type II Diabetes, Gout, Neuropathy Photos Photo Uploaded By: Montey Hora on 01/03/2018 11:27:38 Wound Measurements Length: (cm) 4.9 Width: (cm) 2.6 Depth: (cm) 0.3 Area: (cm) 10.006 Volume: (cm) 3.002 % Reduction in Area: -5964.2% % Reduction in Volume: -18662.5% Epithelialization: None Tunneling: No Undermining: No Wound Description Classification: Grade 2 Wound Margin: Flat and Intact Exudate Amount: Large Exudate Type: Purulent Exudate Color: yellow, brown, green Foul Odor After Cleansing: Yes Due to Product Use: No Slough/Fibrino Yes Wound Bed Granulation Amount: Large (67-100%) Exposed Structure Granulation Quality: Red, Pink Fascia Exposed: No Necrotic Amount: Small (1-33%) Fat Layer (Subcutaneous Tissue) Exposed: Yes Necrotic Quality: Eschar, Adherent Slough Tendon Exposed: No Muscle Exposed: No Joint Exposed: No Brisbin, Rahil ([id number]) Bone Exposed: No Periwound Skin Texture Texture Color No Abnormalities Noted: No No Abnormalities Noted: No Callus: No Atrophie Blanche: No Crepitus: No Cyanosis: No Excoriation: No Ecchymosis: No Induration: No Erythema: No Rash: No Hemosiderin Staining: No Scarring: Yes Mottled: No Pallor: No Moisture Rubor: No No Abnormalities Noted: No Dry / Scaly: No Temperature / Pain Maceration: Yes Temperature: No  Abnormality Tenderness on Palpation: Yes Wound Preparation Ulcer Cleansing: Rinsed/Irrigated with Saline, Other: soap and water, Topical Anesthetic Applied: Other: lidocaine 4%, Treatment Notes Wound #8 (Right, Proximal, Medial Lower Leg) 1. Cleansed with: Clean wound with Normal Saline 2. Anesthetic Topical Lidocaine 4% cream to wound bed prior to debridement 3. Peri-wound Care: Moisturizing lotion Other peri-wound care (specify in notes) 4. Dressing Applied: Iodoflex 5. Secondary Dressing Applied ABD Pad 7. Secured with 3 Layer Compression System - Right Lower Extremity Notes tca cream mixed with lotion on lower leg, unna to anchor Electronic Signature(s) Signed: 01/03/2018 5:07:08 PM By: Marjory Lies,  Di Kindle Entered By: Montey Hora on 01/03/2018 08:19:05 Cathers, [name] ([id number]) -------------------------------------------------------------------------------- Vitals Details Patient Name: [name] Date of Service: 01/03/2018 8:00 AM Medical Record Number: [id number] Patient Account Number: [id number] Date of Birth/Sex: 1951-02-28 (67 y.o. M) Treating RN: Montey Hora Primary Care Amberrose Friebel: Royetta Crochet Other Clinician: Referring Cloey Sferrazza: Royetta Crochet Treating Rayce Brahmbhatt/Extender: STONE III, HOYT Weeks in Treatment: 10 Vital Signs Time Taken: 08:08 Temperature (F): 97.6 Height (in): 74 Pulse (bpm): 55 Weight (lbs): 236 Respiratory Rate (breaths/min): 18 Body Mass Index (BMI): 30.3 Blood Pressure (mmHg): 154/76 Reference Range: 80 - 120 mg / dl Electronic Signature(s) Signed: 01/03/2018 5:07:08 PM By: Montey Hora Entered By: Montey Hora on 01/03/2018 08:09:35

## 2018-01-16 ENCOUNTER — Encounter: Payer: Self-pay | Admitting: Emergency Medicine

## 2018-01-16 ENCOUNTER — Emergency Department
Admission: EM | Admit: 2018-01-16 | Discharge: 2018-01-16 | Disposition: A | Discharge status: Home / Self Care | Payer: Medicare Other | Attending: Emergency Medicine | Admitting: Emergency Medicine

## 2018-01-16 ENCOUNTER — Emergency Department: Payer: Medicare Other

## 2018-01-16 ENCOUNTER — Other Ambulatory Visit: Payer: Self-pay

## 2018-01-16 DIAGNOSIS — M79605 Pain in left leg: Secondary | ICD-10-CM | POA: Diagnosis present

## 2018-01-16 DIAGNOSIS — Z8673 Personal history of transient ischemic attack (TIA), and cerebral infarction without residual deficits: Secondary | ICD-10-CM | POA: Insufficient documentation

## 2018-01-16 DIAGNOSIS — I10 Essential (primary) hypertension: Secondary | ICD-10-CM | POA: Diagnosis not present

## 2018-01-16 DIAGNOSIS — E119 Type 2 diabetes mellitus without complications: Secondary | ICD-10-CM | POA: Insufficient documentation

## 2018-01-16 DIAGNOSIS — I82402 Acute embolism and thrombosis of unspecified deep veins of left lower extremity: Secondary | ICD-10-CM | POA: Diagnosis not present

## 2018-01-16 DIAGNOSIS — F17210 Nicotine dependence, cigarettes, uncomplicated: Secondary | ICD-10-CM | POA: Diagnosis not present

## 2018-01-16 LAB — CBC WITH DIFFERENTIAL/PLATELET
Basophils Absolute: 0 10*3/uL (ref 0–0.1)
Basophils Relative: 1 %
EOS ABS: 0.1 10*3/uL (ref 0–0.7)
EOS PCT: 3 %
HCT: 33.1 % — ABNORMAL LOW (ref 40.0–52.0)
HEMOGLOBIN: 10.8 g/dL — AB (ref 13.0–18.0)
Lymphocytes Relative: 21 %
Lymphs Abs: 0.7 10*3/uL — ABNORMAL LOW (ref 1.0–3.6)
MCH: 24.7 pg — AB (ref 26.0–34.0)
MCHC: 32.6 g/dL (ref 32.0–36.0)
MCV: 75.9 fL — ABNORMAL LOW (ref 80.0–100.0)
Monocytes Absolute: 0.4 10*3/uL (ref 0.2–1.0)
Monocytes Relative: 12 %
NEUTROS PCT: 63 %
Neutro Abs: 2.1 10*3/uL (ref 1.4–6.5)
PLATELETS: 127 10*3/uL — AB (ref 150–440)
RBC: 4.35 MIL/uL — AB (ref 4.40–5.90)
RDW: 16.9 % — ABNORMAL HIGH (ref 11.5–14.5)
WBC: 3.3 10*3/uL — AB (ref 3.8–10.6)

## 2018-01-16 LAB — COMPREHENSIVE METABOLIC PANEL
ALT: 24 U/L (ref 0–44)
AST: 28 U/L (ref 15–41)
Albumin: 3.1 g/dL — ABNORMAL LOW (ref 3.5–5.0)
Alkaline Phosphatase: 71 U/L (ref 38–126)
Anion gap: 6 (ref 5–15)
BUN: 12 mg/dL (ref 8–23)
CALCIUM: 8.3 mg/dL — AB (ref 8.9–10.3)
CHLORIDE: 105 mmol/L (ref 98–111)
CO2: 26 mmol/L (ref 22–32)
CREATININE: 0.73 mg/dL (ref 0.61–1.24)
GFR calc non Af Amer: >60 mL/min (ref >60)
GLUCOSE: 102 mg/dL — AB (ref 70–99)
Potassium: 3.6 mmol/L (ref 3.5–5.1)
SODIUM: 137 mmol/L (ref 135–145)
Total Bilirubin: 1.5 mg/dL — ABNORMAL HIGH (ref 0.3–1.2)
Total Protein: 6.5 g/dL (ref 6.5–8.1)

## 2018-01-16 LAB — PROTIME-INR
INR: 2.21
Prothrombin Time: 24.3 seconds — ABNORMAL HIGH (ref 11.4–15.2)

## 2018-01-16 LAB — APTT: aPTT: 50 seconds — ABNORMAL HIGH (ref 24–36)

## 2018-01-16 MED ORDER — APIXABAN 5 MG PO TABS
10.0000 mg | ORAL_TABLET | Freq: Once | ORAL | Status: AC
  Administered 2018-01-16: 10 mg via ORAL
  Filled 2018-01-16: qty 2

## 2018-01-16 MED ORDER — OXYCODONE-ACETAMINOPHEN 5-325 MG PO TABS
2.0000 | ORAL_TABLET | Freq: Once | ORAL | Status: AC
  Administered 2018-01-16: 2 via ORAL
  Filled 2018-01-16: qty 2

## 2018-01-16 MED ORDER — OXYCODONE-ACETAMINOPHEN 7.5-325 MG PO TABS: 1.0000 | ORAL_TABLET | ORAL | 0 refills | Status: DC | PRN

## 2018-01-16 MED ORDER — HYDROMORPHONE HCL 1 MG/ML IJ SOLN
0.5000 mg | Freq: Once | INTRAMUSCULAR | Status: AC
  Administered 2018-01-16: 0.5 mg via INTRAVENOUS
  Filled 2018-01-16: qty 1

## 2018-01-16 MED ORDER — APIXABAN 5 MG PO TABS: ORAL_TABLET | ORAL | 3 refills | Status: DC

## 2018-01-16 NOTE — ED Notes (Signed)
Pt handed remote, oriented to call bell, and repositioned for comfort. Pain medicine given.

## 2018-01-16 NOTE — ED Triage Notes (Signed)
L leg pain x 2 days. Denies injury. Both ankles noted edematous which he states is usual for him, appear equal in wheelchair. Denies chest pain, states slight SOB but not increased from baseline.

## 2018-01-16 NOTE — ED Provider Notes (Addendum)
[facility] Emergency Department Provider Note       Time seen: ----------------------------------------- 8:42 AM on 01/16/2018 -----------------------------------------   I have reviewed the triage vital signs and the nursing notes.  HISTORY   Chief Complaint Leg Pain    HPI [name] is a 67 y.o. male with a history of atrial fibrillation, cocaine abuse who presents to the ED for increasing pain to the left leg.  Patient has had left leg pain for the past 2 days.  He denies any injuries.  Both legs have been edematous, currently he is being treated for a wound around his right ankle at the wound clinic and is wearing a Unna boot dressing.  Patient describes pain like a toothache in his right leg.  Past Medical History:  Diagnosis Date  . Atrial fibrillation ([place])   . Cocaine abuse ([place])    + UDS on admission  . Gout current and history of  . History of deep vein thrombosis 2006  . History of tobacco abuse    has smoked for 50 years  . Hypertension   . Pulmonary embolism ([place])   . Stroke [facility])     Patient Active Problem List   Diagnosis Date Noted  . Arthritis 05/12/2017  . Varicose veins of leg with swelling, bilateral 01/18/2017  . Facial twitching   . Cerebral infarction ([place]) 10/03/2016  . 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 Pneumonia 10/12/2014  . Osteoarthritis of right knee 02/11/2014  . Pain and swelling of knee 01/08/2014  . Pain in extremity 01/08/2014  . Weakness due to cerebrovascular accident 01/08/2014  . Pulmonary embolism ([place]) 06/19/2012  . Gout 09/22/2011  . 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 CVA (cerebral infarction) 04/26/2011  . Diabetes mellitus ([place]) 04/26/2011  . 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 Anemia 04/26/2011  . DVT (deep venous thrombosis) ([place]) 04/26/2011  . Essential (primary) hypertension 04/26/2011    Past Surgical History:   Procedure Laterality Date  . VASCULAR SURGERY     Stent in leg    Allergies Patient has no known allergies.  Social History Social History   Tobacco Use  . Smoking status: Current Every Day Smoker    Packs/day: 0.50    Years: 50.00    Pack years: 25.00    Types: Cigarettes  . Smokeless tobacco: Never Used  Substance Use Topics  . Alcohol use: No  . Drug use: No    Comment: [facility] drug screen + cocaine on admit   Review of Systems Constitutional: Negative for fever. Cardiovascular: Negative for chest pain. Respiratory: Negative for shortness of breath. Musculoskeletal: Positive for left leg pain Skin: Positive for left ankle wound Neurological: Negative for headaches, focal weakness or numbness.  All systems negative/normal/unremarkable except as stated in the HPI  ____________________________________________   PHYSICAL EXAM:  VITAL SIGNS: ED Triage Vitals  Enc Vitals Group     BP 01/16/18 0835 (!) 174/97     Pulse Rate 01/16/18 0835 66     Resp 01/16/18 0835 18     Temp 01/16/18 0835 99 F (37.2 C)     Temp Source 01/16/18 0835 Oral     SpO2 01/16/18 0835 98 %     Weight 01/16/18 0837 235 lb (106.6 kg)     Height 01/16/18 0837 6\' 2"  (1.88 m)     Head Circumference --      Peak Flow --  Pain Score 01/16/18 0836 8     Pain Loc --      Pain Edu? --      Excl. in [place]? --    Constitutional: Alert and oriented. Well appearing and in no distress. Eyes: Conjunctivae are normal. Normal extraocular movements. Cardiovascular: Normal rate, regular rhythm. No murmurs, rubs, or gallops. Respiratory: Normal respiratory effort without tachypnea nor retractions. Breath sounds are clear and equal bilaterally. No wheezes/rales/rhonchi. Gastrointestinal: Soft and nontender. Normal bowel sounds Musculoskeletal: Bilateral lower extremity edema is noted, right lower extremity in a Unna boot wrap.  Left lower extremity is non-focally tender but diffusely tender from the  knee to the ankle.  No obvious erythema or wounds.  Extremity is warm with good capillary refill Neurologic:  Normal speech and language. No gross focal neurologic deficits are appreciated.  Skin:  Skin is warm, dry and intact. No rash noted. ____________________________________________  ED COURSE:  As part of my medical decision making, I reviewed the following data within the [place] History obtained from family if available, nursing notes, old chart and ekg, as well as notes from prior ED visits. Patient presented for left lower extremity pain and swelling, we will assess with labs and imaging as indicated at this time.   Procedures ____________________________________________   LABS (pertinent positives/negatives)  Labs Reviewed  CBC WITH DIFFERENTIAL/PLATELET - Abnormal; Notable for the following components:      Result Value   WBC 3.3 (*)    RBC 4.35 (*)    Hemoglobin 10.8 (*)    HCT 33.1 (*)    MCV 75.9 (*)    MCH 24.7 (*)    RDW 16.9 (*)    Platelets 127 (*)    Lymphs Abs 0.7 (*)    All other components within normal limits  COMPREHENSIVE METABOLIC PANEL - Abnormal; Notable for the following components:   Glucose, Bld 102 (*)    Calcium 8.3 (*)    Albumin 3.1 (*)    Total Bilirubin 1.5 (*)    All other components within normal limits  PROTIME-INR - Abnormal; Notable for the following components:   Prothrombin Time 24.3 (*)    All other components within normal limits  APTT - Abnormal; Notable for the following components:   aPTT 50 (*)    All other components within normal limits   EKG: Interpreted by me, likely atrial fibrillation with a rate of 64 bpm, normal axis, normal QT.  RADIOLOGY Images were viewed by me  Tib-fib x-ray, ultrasound left lower extremity IMPRESSION: No acute abnormality.  Osteoarthritis left knee and tibiotalar joint.  Atherosclerosis. 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  Please note that it is difficult to confirm by imaging that there is acute thrombus given the extent of chronic thrombus which was also present on prior imaging from 11/13/2013. ____________________________________________  DIFFERENTIAL DIAGNOSIS   Edema, DVT, cellulitis, chronic pain, gout  FINAL ASSESSMENT AND PLAN  Left lower extremity pain, DVT   Plan: The patient had presented for left lower extremity pain. Patient's labs do not reveal any acute process, his INR is therapeutic. Patient's imaging was concerning for some acute on chronic DVT in the left leg.  Patient has normal dopplerable pulses in the left leg and good capillary refill.  This is likely more chronic than acute.  Have discussed with vascular surgery who  recommended changing from Coumadin to Eliquis.  He received his first dose of Eliquis here.  We will advise stopping Coumadin and outpatient follow-up with his doctor.   Laurence Aly, MD   Note: This note was generated in part or whole with voice recognition software. Voice recognition is usually quite accurate but there are transcription errors that can and very often do occur. I apologize for any typographical errors that were not detected and corrected.     Earleen Newport, MD 01/16/18 1105    Earleen Newport, MD 01/16/18 1126

## 2018-01-17 ENCOUNTER — Encounter: Payer: Medicare Other | Attending: Physician Assistant | Admitting: Physician Assistant

## 2018-01-17 DIAGNOSIS — L97812 Non-pressure chronic ulcer of other part of right lower leg with fat layer exposed: Secondary | ICD-10-CM | POA: Diagnosis not present

## 2018-01-17 DIAGNOSIS — E1140 Type 2 diabetes mellitus with diabetic neuropathy, unspecified: Secondary | ICD-10-CM | POA: Insufficient documentation

## 2018-01-17 DIAGNOSIS — M109 Gout, unspecified: Secondary | ICD-10-CM | POA: Diagnosis not present

## 2018-01-17 DIAGNOSIS — Z86718 Personal history of other venous thrombosis and embolism: Secondary | ICD-10-CM | POA: Insufficient documentation

## 2018-01-17 DIAGNOSIS — J449 Chronic obstructive pulmonary disease, unspecified: Secondary | ICD-10-CM | POA: Diagnosis not present

## 2018-01-17 DIAGNOSIS — E11622 Type 2 diabetes mellitus with other skin ulcer: Secondary | ICD-10-CM | POA: Diagnosis not present

## 2018-01-17 DIAGNOSIS — I890 Lymphedema, not elsewhere classified: Secondary | ICD-10-CM | POA: Diagnosis not present

## 2018-01-17 DIAGNOSIS — I872 Venous insufficiency (chronic) (peripheral): Secondary | ICD-10-CM | POA: Diagnosis not present

## 2018-01-17 DIAGNOSIS — Z86711 Personal history of pulmonary embolism: Secondary | ICD-10-CM | POA: Insufficient documentation

## 2018-01-17 DIAGNOSIS — F17210 Nicotine dependence, cigarettes, uncomplicated: Secondary | ICD-10-CM | POA: Diagnosis not present

## 2018-01-17 DIAGNOSIS — E1151 Type 2 diabetes mellitus with diabetic peripheral angiopathy without gangrene: Secondary | ICD-10-CM | POA: Insufficient documentation

## 2018-01-17 DIAGNOSIS — I4891 Unspecified atrial fibrillation: Secondary | ICD-10-CM | POA: Diagnosis not present

## 2018-01-17 DIAGNOSIS — Z8673 Personal history of transient ischemic attack (TIA), and cerebral infarction without residual deficits: Secondary | ICD-10-CM | POA: Insufficient documentation

## 2018-01-17 DIAGNOSIS — Z7901 Long term (current) use of anticoagulants: Secondary | ICD-10-CM | POA: Diagnosis not present

## 2018-01-19 NOTE — Progress Notes (Signed)
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 improvement. He is pleased with how this is progressing. GIOVANNY, DUGAL ([id number]) 11/30/16; improved using Hydrofera Blue 12/07/16; continue dramatic progress. Wound is now small and healthy  looking. Continue use of Hydrofera Blue 12/14/16; very small wound albeit with some depth. Continued use of Hydrofera blue. 12/21/16 on evaluation today patient's right lower extremity ulcer appears healed at this point. He is having no discomfort and overall this is doing very well. And there's no sign of infection. 04/26/17; READMISSION Patient we know from previous stays in this clinic. The patient has known diabetic PAD and has a stent in the right leg. 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 When he was last in the clinic in August we had healed him out for what was felt to be a mostly venous insufficiency wound on the medial right leg. He follows with vascular surgery Dr. dew is at [place] [facility]. He has previously undergone successful laser ablation. Reflux studies on 4/24 showed reflux in the common femoral, femoral and popliteal. He underwent successful ablation of the right greater saphenous vein from the distal thigh to the mid calf level. Successful ablation of the right small saphenous vein. He had unsuccessful ablation of the right greater saphenous vein from the saphenofemoral junction to the distal 5 level. The patient wears to let her compression stockings and he has been compliant with this With regards to his diabetes he is not currently on any treatment. He does describe pain in his leg which forces him to stop although I couldn't really quantify this. His last arterial studies were on 07/05/16 which showed triphasic waveforms on the right to the level of the popliteal artery. The right posterior tibial and anterior tibial artery were not visualized on this study. I don't actually see ABIs or TBIs. 05/04/17; the patient has small wounds on the right medial heel and the right medial leg. The area on the foot is just about closed. The area on the right medial leg is improved. He is tolerating the 3 layer compression without any  complaints. The patient has both known PAD and chronic venous reflux [see discussion above]. 05/11/17; the areas on the patient's medial right heel with healed he still has an open area on the right medial leg. This does not require debridement today I think I did read it last week. We are using silver collagen under 3 layer compression. 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 He has been tolerating the dressing changes without complication. With that being said he does have some epithelialization growing into the wound bed and it appears this wound may be trying to healed in a depressed fashion. To be honest that is not the worst case scenario and I think this may definitely do  fine even if it heals in that way. There was no requirement for debridement today as patient appears to be doing very well in regard to his ulcer. His swelling was a little bit more severe than last week but nothing too significant. 07/05/17 on evaluation today patient appears to be doing rather well in regard to his right medial lower extremity ulcer. The wound does appear to be healing although it is healing and somewhat of a cratered fashion the good news is he has excellent epithelium noted. Fortunately there does not appear to be any evidence of infection at this point he is tolerating the dressings as well is the wrap well. 07/12/17 on evaluation today patient appears to be doing very well in regard to his left medial lower extremity ulcer. He has been tolerating the dressing changes without complication. In fact compared to last week this wound which was somewhat concave has fielding dramatically and looks to be doing excellent. I'm extremely happy with the progress. He still has pain rated to be a 4-5/10 but the wound looks great. 07/20/17; the  patient's right medial lower extremity ulcer is closed. He has good edema control. Unfortunately he does not really have adequate compression stockings. 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 He has been treated with compression stockings for some time now in fact we have notes having treated him even as far back as 2016 here in our clinic. Fortunately he has excellent blood flow and tends to heal well. Unfortunately despite wearing his compression stockings, keeping his legs elevated, being on fluid pills (Lasix) and doing everything he can to try to help with fluid he still continues to have issues with this reopening. I think that he may be a good candidate for compression/lymphedema pumps. We have never attempted to get these for him previously. No fevers, chills, nausea, or vomiting noted at this time. Currently patient states his ulcer does hurt although not as bad as some in the past it's also not as large as it has been in the past when it reopened he talked is somewhat early. Obviously this is good news. HOLLIS, TULLER ([id number]) 11/01/17 on evaluation today patient appears to be doing better in regard to his lower extremity ulcer. He has been tolerating the wraps without complication. Overall things seem to be showing signs of good improvement which is excellent. That's what were looking for obviously. He has always done well with the wraps in the past as well. Fortunately there does not appear to be any evidence of infection at this point in time. 11/08/17 on evaluation today patient appears to be doing rather okay in regard to his right medial malleolus ulcer.  With that being said he still seems to have some fluid control issues at this point. With that being said I think we may want to switch from the silvercell to maybe Prisma to try to help this and prove more significantly as far as the moisture control is concerned. Overall I do think she's made some progress and I believe that we may be able to aid him along with just a few minor changes. His swelling seems to be doing well with the wrap.  11/15/17 on evaluation today patient's right medial malleolus ulcer appears to be very moist as far as drainage is concerned. It does appear that the Drawtex was not as beneficial for him as we would've liked. He fortunately has no evidence of infection at this time unfortunately again he did have a lot of maceration noted at this point. His wound was a little bit larger. Otherwise there is no evidence of active infection at this point. 11/22/17 on evaluation today patient's wound actually is a little bit larger and more macerated today compared to previous. The dressings that were applying do seem to be helping to trap the fluid nonetheless the wound is a little bit larger and has spread. With that being said there still slough in the surface of the wound he's had a lot of increased pain therefore I'm trying to avoid sharp debridement today at the patient request. 11/29/17 on evaluation today patient appears to be doing a little better in my pinion in regard to his right medial ankle ulcer. Nonetheless this continues to be very macerated. We have been performing at one time a week dressing change I think this may not be benefiting him I think it may be possible that he would benefit from more frequent dressing changes. At least another rat change once during the week. Nonetheless he is not opposed to this and I think we should consider setting him up with home health. On evaluation today patient appears to be doing rather poorly in regard to his right lower [place]  ulcer. It really has not shown any signs of improvement unfortunately he does have an infection with pseudomonas. Nonetheless the doxycycline is not going to work for this we need to have them on something else I'm gonna go ahead and suggest that we get him on the appropriate antibiotic Cipro. He's in agreement this plan. Unfortunately has been having a lot more pain he states it pretty much hurts all the time this is understandable considering it is infected. 12/13/17 on evaluation today patient appears to actually be doing very well in regard to his right medial ankle ulcer compared to last week's evaluation. I think having him on the correct antibiotics has made all the difference in the world. With that being said he's not having discomfort like he was previous and in general I feel like he has made excellent strides towards healing in the past week. No fevers, chills, nausea, or vomiting noted at this time. 12/20/17-He is seen in follow-up evaluation for a right medial lower extremity wound. There is improvement. He has completed the Cipro with no GI disturbance. We will continue with the same treatment plan and he will follow-up next week 01/03/18 on evaluation today patient appears to be doing rather well in my pinion in regard to his right medial lower extremity ulcer. He's been tolerating the dressing changes without complication the wrap has been causing a little bit of dry skin irritation at this point. We may want to initiate treatment with triamcinolone along with lotion to help with dissertation. Other than this overall the patient seems to be doing better as far as the wound itself is concerned in general appearance. 01/10/18 on evaluation today patient appears to be doing a little worse compared to last week's evaluation. I think that though the Iodoflex has help with cleaning up the surface of the wound subsequently he has having a lot of drainage which I think is causing some issues in  that regard. At this point  I think we may want to switch the dressings. 01/17/18 on evaluation today patient appears to be doing rather well in regard to his right medial malleolus ulcer. The good news is I do feel like he showing some signs of improvement which is excellent. I feel like that the [facility] Dressing been better for him than the Iodoflex that we will previously utilizing. He is having much less in the way of discomfort which is good news. Overall very pleased in that regard. Nonetheless the appearance of the wound is improved today as well and that is excellent news. He still has pain but not seemingly as much as previous and I don't feel like there's much maceration either. Patient History Information obtained from Patient. Family History GRANT, SWAGER ([id number]) Heart Disease - Mother,Father, Hypertension - Mother,Father, Stroke - Father, No family history of Cancer, Diabetes, Hereditary Spherocytosis, Thyroid Problems, Tuberculosis. Social History Current every day smoker, Marital Status - Single, Alcohol Use - Never, Drug Use - No History, Caffeine Use - Daily. Medical History Hospitalization/Surgery History - 05/17/2012, [facility], CVA. - 03/17/2017, [facility], Pneumonia. Medical And Surgical History Notes Ear/Nose/Mouth/Throat difficulty speaking Respiratory Hospitalized for Pneumonia 02/2017 Neurologic CVA in 2014 Review of Systems (ROS) Constitutional Symptoms (General Health) Denies complaints or symptoms of Fever, Chills. Respiratory The patient has no complaints or symptoms. Cardiovascular Complains or has symptoms of LE edema. Psychiatric The patient has no complaints or symptoms. Objective Constitutional Well-nourished and well-hydrated in no acute distress. Vitals Time Taken: 8:13 AM, Height: 74 in, Weight: 236 lbs, BMI: 30.3, Temperature: 97.9 F, Pulse: 52 bpm, Respiratory Rate: 18 breaths/min, Blood Pressure: 151/88 mmHg. Respiratory normal  breathing without difficulty. Psychiatric this patient is able to make decisions and demonstrates good insight into disease process. Alert and Oriented x 3. pleasant and cooperative. General Notes: Patient's wound bed actually shows evidence of some Slough on the surface of the wound but there is actually more granulation that appears healthy poking through than what I have been seen in past weeks. Overall I'm fairly pleased in that regard. I would recommend that we likely continue with the current measures. Sharp debridement was performed to clean off the surface of the wound patient seem to tolerate that much better this week as well which was also good news. Rivero, [name] ([id number]) Integumentary (Hair, Skin) Wound #8 status is Open. Original cause of wound was Gradually Appeared. The wound is located on the Right,Proximal,Medial Lower Leg. The wound measures 5.5cm length x 2.5cm width x 0.3cm depth; 10.799cm^2 area and 3.24cm^3 volume. There is Fat Layer (Subcutaneous Tissue) Exposed exposed. There is no tunneling or undermining noted. There is a large amount of purulent drainage noted. Foul odor after cleansing was noted. The wound margin is flat and intact. There is large (67-100%) red, pink granulation within the wound bed. There is a small (1-33%) amount of necrotic tissue within the wound bed including Eschar and Adherent Slough. The periwound skin appearance exhibited: Scarring, Maceration. The periwound skin appearance did not exhibit: Callus, Crepitus, Excoriation, Induration, Rash, Dry/Scaly, Atrophie Blanche, Cyanosis, Ecchymosis, Hemosiderin Staining, Mottled, Pallor, Rubor, Erythema. Periwound temperature was noted as No Abnormality. The periwound has tenderness on palpation. 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 There was a Excisional Skin/Subcutaneous Tissue Debridement with a total area of 13.75 sq cm performed by STONE III, HOYT E., PA-C. With the following instrument(s): Curette to remove Viable and Non-Viable tissue/material. Material removed includes Subcutaneous Tissue and Slough and after achieving pain control using Lidocaine 4% Topical Solution. No specimens were taken. A time out was conducted at 08:31, prior to the start of the procedure. A Minimum amount of bleeding was controlled with Pressure. The procedure was tolerated well with a pain level of 0 throughout and a pain level of 0 following the procedure. Patient s Level of Consciousness post procedure was recorded as Awake and Alert. Post Debridement Measurements: 5.5cm length x 2.5cm width x 0.4cm depth; 4.32cm^3 volume. Character of Wound/Ulcer Post Debridement is improved. Severity of Tissue Post Debridement is: Fat layer exposed. Post procedure Diagnosis Wound #8: Same as Pre-Procedure Plan Wound Cleansing: Wound #8 Right,Proximal,Medial Lower Leg: Clean wound with Normal Saline. Cleanse wound with mild soap and water Anesthetic (add to Medication List): Wound #8 Right,Proximal,Medial Lower Leg: Topical Lidocaine 4% cream applied to wound bed prior to debridement (In Clinic Only). Ferns, [name] ([id number]) Skin Barriers/Peri-Wound Care: Wound #8 Right,Proximal,Medial Lower Leg: Moisturizing lotion - not on wound area Triamcinolone Acetonide Ointment (TCA) Primary Wound Dressing: Wound #8 Right,Proximal,Medial Lower Leg: Hydrafera Blue Ready Transfer Secondary Dressing: Wound #8 Right,Proximal,Medial Lower Leg: ABD pad XtraSorb Dressing Change  Frequency: Wound #8 Right,Proximal,Medial Lower Leg: Change dressing every week Follow-up Appointments: Wound #8 Right,Proximal,Medial Lower Leg: Return Appointment in 1 week. Nurse Visit as needed Edema Control: Wound #8 Right,Proximal,Medial Lower Leg: 3 Layer Compression System - Right Lower Extremity - unna to anchor Other: - order bilateral compression pumps Additional Orders / Instructions: Wound #8 Right,Proximal,Medial Lower Leg: Increase protein intake. At this point I'm in a recommend that we actually continue with the above wound care orders for the next week. If anything changes or worsens in the interim the patient will let me know. Otherwise my hope is that this will be a turnaround point where the wound will continue to improve from this point forward especially as good as things appear today. Please see above for specific wound care orders. We will see patient for re-evaluation in 1 week(s) here in the clinic. If anything worsens or changes patient will contact our office for additional recommendations. Electronic Signature(s) Signed: 01/18/2018 11:11:19 AM By: Worthy Keeler PA-C Entered By: Worthy Keeler on 01/17/2018 08:40:37 Kawano, [name] ([id number]) -------------------------------------------------------------------------------- ROS/PFSH Details Patient Name: [name] Date of Service: 01/17/2018 8:00 AM Medical Record Number: [id number] Patient Account Number: [id number] Date of Birth/Sex: 1950-12-30 (67 y.o. Male) Treating RN: Montey Hora Primary Care Provider: Royetta Crochet Other Clinician: Referring Provider: Royetta Crochet Treating Provider/Extender: Melburn Hake, HOYT Weeks in Treatment: 12 Information Obtained From Patient Wound History Do you currently have one or more open woundso Yes How many open wounds do you currently haveo 1 Approximately how long have you had your woundso 1 week How have you been treating your wound(s) until  nowo lotion Has your wound(s) ever healed and then re-openedo Yes Have you had any lab work done in the past montho No Have you tested positive for an antibiotic resistant organism (MRSA, VRE)o No Have you tested positive for osteomyelitis (bone infection)o No Have you had any tests for circulation on your legso Yes Who ordered the testo [facility] Where was the test doneo [facility] Constitutional Symptoms (General Health) Complaints  and Symptoms: Negative for: Fever; Chills Cardiovascular Complaints and Symptoms: Positive for: LE edema Medical History: Positive for: Arrhythmia - a fib; Deep Vein Thrombosis; Hypertension Negative for: Angina; Congestive Heart Failure; Coronary Artery Disease; Hypotension; Myocardial Infarction; Peripheral Arterial Disease; Peripheral Venous Disease; Phlebitis; Vasculitis Eyes Medical History: Negative for: Cataracts; Glaucoma; Optic Neuritis Ear/Nose/Mouth/Throat Medical History: Negative for: Chronic sinus problems/congestion; Middle ear problems Past Medical History Notes: difficulty speaking Hematologic/Lymphatic Medical History: Negative for: Anemia; Hemophilia; Human Immunodeficiency Virus; Lymphedema; Sickle Cell Disease Respiratory Headings, Fallon ([id number]) Complaints and Symptoms: No Complaints or Symptoms Medical History: Positive for: Chronic Obstructive Pulmonary Disease (COPD) Negative for: Aspiration; Asthma; Pneumothorax; Sleep Apnea; Tuberculosis Past Medical History Notes: Hospitalized for Pneumonia 02/2017 Gastrointestinal Medical History: Negative for: Cirrhosis ; Colitis; Crohnos; Hepatitis A; Hepatitis B; Hepatitis C Endocrine Medical History: Positive for: Type II Diabetes Negative for: Type I Diabetes Time with diabetes: since 2017 Treated with: Diet Blood sugar tested every day: No Genitourinary Medical History: Negative for: End Stage Renal Disease Immunological Medical History: Negative for: Lupus  Erythematosus; Raynaudos; Scleroderma Integumentary (Skin) Medical History: Negative for: History of Burn; History of pressure wounds Musculoskeletal Medical History: Positive for: Gout Negative for: Rheumatoid Arthritis; Osteoarthritis; Osteomyelitis Neurologic Medical History: Positive for: Neuropathy Negative for: Dementia; Quadriplegia; Paraplegia; Seizure Disorder Past Medical History Notes: CVA in 2014 Oncologic Medical History: Negative for: Received Chemotherapy; Received Radiation Psychiatric FRANKIE, SCIPIO ([id number]) Complaints and Symptoms: No Complaints or Symptoms Medical History: Negative for: Anorexia/bulimia; Confinement Anxiety Immunizations Pneumococcal Vaccine: Received Pneumococcal Vaccination: Yes Immunization Notes: up to date Implantable Devices Hospitalization / Surgery History Name of Hospital Purpose of Hospitalization/Surgery Date [place] CVA 05/17/2012 [place] Pneumonia 03/17/2017 Family and Social History Cancer: No; Diabetes: No; Heart Disease: Yes - Mother,Father; Hereditary Spherocytosis: No; Hypertension: Yes - Mother,Father; Stroke: Yes - Father; Thyroid Problems: No; Tuberculosis: No; Current every day smoker; Marital Status - Single; Alcohol Use: Never; Drug Use: No History; Caffeine Use: Daily; Financial Concerns: No; Food, Clothing or Shelter Needs: No; Support System Lacking: No; Transportation Concerns: No; Advanced Directives: No; Patient does not want information on Advanced Directives Physician Affirmation I have reviewed and agree with the above information. Electronic Signature(s) Signed: 01/17/2018 5:51:08 PM By: Montey Hora Signed: 01/18/2018 11:11:19 AM By: Worthy Keeler PA-C Entered By: Worthy Keeler on 01/17/2018 08:37:46 Hoggard, [name] ([id number]) -------------------------------------------------------------------------------- SuperBill Details Patient Name: [name] Date of Service: 01/17/2018 Medical  Record Number: [id number] Patient Account Number: [id number] Date of Birth/Sex: 1950/12/02 (67 y.o. Male) Treating RN: Montey Hora Primary Care Provider: Royetta Crochet Other Clinician: Referring Provider: Royetta Crochet Treating Provider/Extender: Melburn Hake, HOYT Weeks in Treatment: 12 Diagnosis Coding ICD-10 Codes Code Description I87.2 Venous insufficiency (chronic) (peripheral) I89.0 Lymphedema, not elsewhere classified E11.622 Type 2 diabetes mellitus with other skin ulcer L97.812 Non-pressure chronic ulcer of other part of right lower leg with fat layer exposed Facility Procedures CPT4 Code Description: 35009381 11042 - DEB SUBQ TISSUE 20 SQ CM/< ICD-10 Diagnosis Description W29.937 Non-pressure chronic ulcer of other part of right lower leg wit Modifier: h fat layer expo Quantity: 1 sed Physician Procedures CPT4 Code Description: 1696789 11042 - WC PHYS SUBQ TISS 20 SQ CM ICD-10 Diagnosis Description F81.017 Non-pressure chronic ulcer of other part of right lower leg wit Modifier: h fat layer expo Quantity: 1 sed Electronic Signature(s) Signed: 01/18/2018 11:11:19 AM By: Worthy Keeler PA-C Entered By: Worthy Keeler on 01/17/2018 08:40:52

## 2018-01-19 NOTE — Progress Notes (Signed)
Mark, [name] ([id number]) Visit Report for 01/17/2018 Arrival Information Details Patient Name: [name], [name] Date of Service: 01/17/2018 8:00 AM Medical Record Number: [id number] Patient Account Number: [id number] Date of Birth/Sex: Sep 27, 1950 (67 y.o. Male) Treating RN: Roger Shelter Primary Care Jen Eppinger: Royetta Crochet Other Clinician: Referring Klever Twyford: Royetta Crochet Treating Tyah Acord/Extender: Melburn Hake, HOYT Weeks in Treatment: 12 Visit Information History Since Last Visit All ordered tests and consults were completed: No Patient Arrived: Ambulatory Added or deleted any medications: No Arrival Time: 08:12 Any new allergies or adverse reactions: No Accompanied By: self Had a fall or experienced change in No Transfer Assistance: None activities of daily living that may affect Patient Requires Transmission-Based No risk of falls: Precautions: Signs or symptoms of abuse/neglect since last visito No Patient Has Alerts: Yes Hospitalized since last visit: No Patient Alerts: Patient on Blood Implantable device outside of the clinic excluding No Thinner cellular tissue based products placed in the center DMII since last visit: warfarin Pain Present Now: No Electronic Signature(s) Signed: 01/17/2018 4:40:26 PM By: Roger Shelter Entered By: Roger Shelter on 01/17/2018 08:13:40 Boss, [name] ([id number]) -------------------------------------------------------------------------------- Encounter Discharge Information Details Patient Name: [name] Date of Service: 01/17/2018 8:00 AM Medical Record Number: [id number] Patient Account Number: [id number] Date of Birth/Sex: [birth year] (67 y.o. Male) Treating RN: Cornell Barman Primary Care Eon Zunker: Royetta Crochet Other Clinician: Referring Kayo Zion: Royetta Crochet Treating Cristal Howatt/Extender: Melburn Hake, HOYT Weeks in Treatment: 12 Encounter Discharge Information Items Discharge Condition:  Stable Ambulatory Status: Ambulatory Discharge Destination: Home Transportation: Private Auto Accompanied By: self Schedule Follow-up Appointment: Yes Clinical Summary of Care: Electronic Signature(s) Signed: 01/17/2018 6:56:50 PM By: Gretta Cool, [name], RN, CWS, [name], [name] Entered By: Gretta Cool, [name], RN, CWS, Kim on 01/17/2018 08:47:08 Bartow, [name] ([id number]) -------------------------------------------------------------------------------- Lower Extremity Assessment Details Patient Name: [name] Date of Service: 01/17/2018 8:00 AM Medical Record Number: [id number] Patient Account Number: [id number] Date of Birth/Sex: February 05, 1951 (67 y.o. Male) Treating RN: Roger Shelter Primary Care Tavaris Eudy: Royetta Crochet Other Clinician: Referring Aiman Noe: Royetta Crochet Treating Atthew Coutant/Extender: STONE III, HOYT Weeks in Treatment: 12 Edema Assessment Assessed: [Left: No] [Right: No] Edema: [Left: Ye] [Right: s] Calf Left: Right: Point of Measurement: cm From Medial Instep cm 43.3 cm Ankle Left: Right: Point of Measurement: cm From Medial Instep cm 28 cm Vascular Assessment Claudication: Claudication Assessment [Right:None] Pulses: Dorsalis Pedis Palpable: [Right:Yes] Posterior Tibial Extremity colors, hair growth, and conditions: Extremity Color: [Right:Hyperpigmented] Temperature of Extremity: [Right:Warm] Capillary Refill: [Right:< 3 seconds] Toe Nail Assessment Left: Right: Thick: Yes Discolored: Yes Deformed: Yes Improper Length and Hygiene: Yes Electronic Signature(s) Signed: 01/17/2018 4:40:26 PM By: Roger Shelter Entered By: Roger Shelter on 01/17/2018 08:22:55 Pisarski, [name] ([id number]) -------------------------------------------------------------------------------- Multi Wound Chart Details Patient Name: [name] Date of Service: 01/17/2018 8:00 AM Medical Record Number: [id number] Patient Account Number: [id number] Date of Birth/Sex:  1950/11/18 (67 y.o. 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] [N/A:N/A] Secondary Etiology: [8:Lymphedema] [N/A:N/A] Comorbid History: [8:Chronic Obstructive Pulmonary Disease (COPD), Arrhythmia, Deep Vein Thrombosis, Hypertension, Type II Diabetes, Gout, Neuropathy] [N/A:N/A] Date Acquired: [8:10/17/2017] [N/A:N/A] Weeks of Treatment: [8:12] [N/A:N/A] Wound Status: [8:Open] [N/A:N/A] Measurements L x W x D [  8:5.5x2.5x0.3] [N/A:N/A] (cm) Area (cm) : [8:10.799] [N/A:N/A] Volume (cm) : [8:3.24] [N/A:N/A] % Reduction in Area: [8:-6444.80%] [N/A:N/A] % Reduction in Volume: [8:-20150.00%] [N/A:N/A] Classification: [8:Grade 2] [N/A:N/A] Exudate Amount: [8:Large] [N/A:N/A] Exudate Type: [8:Purulent] [N/A:N/A] Exudate Color: [8:yellow, brown, green] [N/A:N/A] Foul Odor After Cleansing: [8:Yes] [N/A:N/A] Odor Anticipated Due to [8:No] [N/A:N/A] Product Use: Wound Margin: [8:Flat and Intact] [N/A:N/A] Granulation Amount: [8:Large (67-100%)] [N/A:N/A] Granulation Quality: [8:Red, Pink] [N/A:N/A] Necrotic Amount: [8:Small (1-33%)] [N/A:N/A] Necrotic Tissue: [8:Eschar, Adherent Slough] [N/A:N/A] Exposed Structures: [8:Fat Layer (Subcutaneous Tissue) Exposed: Yes Fascia: No] [N/A:N/A] Tendon: No Muscle: No Joint: No Bone: No Epithelialization: None N/A N/A Periwound Skin Texture: Scarring: Yes N/A N/A Excoriation: No Induration: No Callus: No Crepitus:  No Rash: No Periwound Skin Moisture: Maceration: Yes N/A N/A Dry/Scaly: No Periwound Skin Color: Atrophie Blanche: No N/A N/A Cyanosis: No Ecchymosis: No Erythema: No Hemosiderin Staining: No Mottled: No Pallor: No Rubor: No Temperature: No Abnormality N/A N/A Tenderness on Palpation: Yes N/A N/A Wound Preparation: Ulcer Cleansing: N/A N/A Rinsed/Irrigated with Saline, Other: soap and water Topical Anesthetic Applied: Other: lidocaine 4% Treatment Notes Electronic Signature(s) Signed: 01/17/2018 5:51:08 PM By: Montey Hora Entered By: Montey Hora on 01/17/2018 08:31:39 Schellenberg, [name] ([id number]) -------------------------------------------------------------------------------- Multi-Disciplinary Care Plan Details Patient Name: [name] Date of Service: 01/17/2018 8:00 AM Medical Record Number: [id number] Patient Account Number: [id number] Date of Birth/Sex: [birth year] (67 y.o. Male) Treating RN: Montey Hora Primary Care Anglea Gordner: Royetta Crochet Other Clinician: Referring Breyana Follansbee: Royetta Crochet Treating Mikaelyn Arthurs/Extender: Melburn Hake, HOYT Weeks in Treatment: 12 Active Inactive ` Abuse / Safety / Falls / Self Care Management Nursing Diagnoses: Potential for falls Goals: Patient will not experience any injury related to falls Date Initiated: 10/25/2017 Target Resolution Date: 02/18/2018 Goal Status: Active Interventions: Assess Activities of Daily Living upon admission and as needed Assess fall risk on admission and as needed Assess: immobility, friction, shearing, incontinence upon admission and as needed Assess impairment of mobility on admission and as needed per policy Assess personal safety and home safety (as indicated) on admission and as needed Assess self care needs on admission and as needed Notes: ` Nutrition Nursing Diagnoses: Imbalanced nutrition Impaired glucose control: actual or potential Potential for alteratiion in  Nutrition/Potential for imbalanced nutrition Goals: Patient/caregiver agrees to and verbalizes understanding of need to use nutritional supplements and/or vitamins as prescribed Date Initiated: 10/25/2017 Target Resolution Date: 02/18/2018 Goal Status: Active Patient/caregiver will maintain therapeutic glucose control Date Initiated: 10/25/2017 Target Resolution Date: 02/18/2018 Goal Status: Active Interventions: Assess patient nutrition upon admission and as needed per policy Provide education on elevated blood sugars and impact on wound healing Provide education on nutrition DAMAREA, MERKEL ([id number]) Notes: ` Orientation to the Wound Care Program Nursing Diagnoses: Knowledge deficit related to the wound healing center program Goals: Patient/caregiver will verbalize understanding of the [facility] Date Initiated: 10/25/2017 Target Resolution Date: 11/19/2017 Goal Status: Active Interventions: Provide education on orientation to the wound center Notes: ` Wound/Skin Impairment Nursing Diagnoses: Impaired tissue integrity Knowledge deficit related to ulceration/compromised skin integrity Goals: Ulcer/skin breakdown will have a volume reduction of 80% by week 12 Date Initiated: 10/25/2017 Target Resolution Date: 02/11/2018 Goal Status: Active Interventions: Assess patient/caregiver ability to perform ulcer/skin care regimen upon admission and as needed Assess ulceration(s) every visit Notes: Electronic Signature(s) Signed: 01/17/2018 5:51:08 PM By: Montey Hora Entered By: Montey Hora on 01/17/2018 08:31:29 Dunton, [name] ([id number]) -------------------------------------------------------------------------------- Pain Assessment Details Patient Name: [name] Date of Service: 01/17/2018 8:00 AM Medical Record Number: [id number] Patient  Account Number: [id number] Date of Birth/Sex: [birth year] (67 y.o. Male) Treating RN: Roger Shelter Primary Care Elvin Banker: Royetta Crochet Other Clinician: Referring Dorcas Melito: Royetta Crochet Treating Huda Petrey/Extender: STONE III, HOYT Weeks in Treatment: 12 Active Problems Location of Pain Severity and Description of Pain Patient Has Paino No Site Locations Pain Management and Medication Current Pain Management: Electronic Signature(s) Signed: 01/17/2018 4:40:26 PM By: Roger Shelter Entered By: Roger Shelter on 01/17/2018 08:13:47 Wolfgang, [name] ([id number]) -------------------------------------------------------------------------------- Patient/Caregiver Education Details Patient Name: [name] Date of Service: 01/17/2018 8:00 AM Medical Record Number: [id number] Patient Account Number: [id number] Date of Birth/Gender: [birth year] (67 y.o. Male) Treating RN: Cornell Barman Primary Care Physician: Royetta Crochet Other Clinician: Referring Physician: Royetta Crochet Treating Physician/Extender: Sharalyn Ink in Treatment: 12 Education Assessment Education Provided To: Patient Education Topics Provided Elevated Blood Sugar/ Impact on Healing: Handouts: Elevated Blood Sugars: How Do They Affect Wound Healing Methods: Demonstration, Explain/Verbal Responses: State content correctly Venous: Controlling Swelling with Multilayered Compression Wraps, Other: do not get wet, call if wraps slides more than Handouts: one inch down Methods: Demonstration, Explain/Verbal Responses: State content correctly Electronic Signature(s) Signed: 01/17/2018 6:56:50 PM By: Gretta Cool, [name], RN, CWS, [name], [name] Entered By: Gretta Cool, [name], RN, CWS, Kim on 01/17/2018 08:48:18 Pilar, [name] ([id number]) -------------------------------------------------------------------------------- Wound Assessment Details Patient Name: [name] Date of Service: 01/17/2018 8:00 AM Medical Record Number: [id number] Patient Account Number: [id number] Date of Birth/Sex: [birth year] (67 y.o.  Male) Treating RN: Roger Shelter Primary Care Korde Jeppsen: Royetta Crochet Other Clinician: Referring Najla Aughenbaugh: Royetta Crochet Treating Melaysia Streed/Extender: STONE III, HOYT Weeks in Treatment: 12 Wound Status Wound Number: 8 Primary Diabetic Wound/Ulcer of the Lower Extremity Etiology: Wound Location: Right Lower Leg - Medial, Proximal Secondary Lymphedema Wounding Event: Gradually Appeared Etiology: Date Acquired: 10/17/2017 Wound Open Weeks Of Treatment: 12 Status: Clustered Wound: No Comorbid Chronic Obstructive Pulmonary Disease History: (COPD), Arrhythmia, Deep Vein Thrombosis, Hypertension, Type II Diabetes, Gout, Neuropathy Photos Photo Uploaded By: Roger Shelter on 01/17/2018 14:34:40 Wound Measurements Length: (cm) 5.5 Width: (cm) 2.5 Depth: (cm) 0.3 Area: (cm) 10.799 Volume: (cm) 3.24 % Reduction in Area: -6444.8% % Reduction in Volume: -20150% Epithelialization: None Tunneling: No Undermining: No Wound Description Classification: Grade 2 Wound Margin: Flat and Intact Exudate Amount: Large Exudate Type: Purulent Exudate Color: yellow, brown, green Foul Odor After Cleansing: Yes Due to Product Use: No Slough/Fibrino Yes Wound Bed Granulation Amount: Large (67-100%) Exposed Structure Granulation Quality: Red, Pink Fascia Exposed: No Necrotic Amount: Small (1-33%) Fat Layer (Subcutaneous Tissue) Exposed: Yes Necrotic Quality: Eschar, Adherent Slough Tendon Exposed: No Muscle Exposed: No Joint Exposed: No Montour, Elias ([id number]) Bone Exposed: No Periwound Skin Texture Texture Color No Abnormalities Noted: No No Abnormalities Noted: No Callus: No Atrophie Blanche: No Crepitus: No Cyanosis: No Excoriation: No Ecchymosis: No Induration: No Erythema: No Rash: No Hemosiderin Staining: No Scarring: Yes Mottled: No Pallor: No Moisture Rubor: No No Abnormalities Noted: No Dry / Scaly: No Temperature / Pain Maceration:  Yes Temperature: No Abnormality Tenderness on Palpation: Yes Wound Preparation Ulcer Cleansing: Rinsed/Irrigated with Saline, Other: soap and water, Topical Anesthetic Applied: Other: lidocaine 4%, Treatment Notes Wound #8 (Right, Proximal, Medial Lower Leg) 3. Peri-wound Care: Moisturizing lotion 4. Dressing Applied: Hydrafera Blue 5. Secondary Dressing Applied ABD Pad 7. Secured with 3 Layer Compression System - Right Lower Extremity Notes unna to anchor Electronic Signature(s) Signed: 01/17/2018 4:40:26 PM By: Roger Shelter Entered By: Roger Shelter on 01/17/2018 08:21:49 Gambrell, [name] ([id number]) -------------------------------------------------------------------------------- Vitals Details Patient Name: [name] Date of  Service: 01/17/2018 8:00 AM Medical Record Number: [id number] Patient Account Number: [id number] Date of Birth/Sex: 1950/08/07 (67 y.o. Male) Treating RN: Roger Shelter Primary Care Daveah Varone: Royetta Crochet Other Clinician: Referring Rayman Petrosian: Royetta Crochet Treating Ande Therrell/Extender: STONE III, HOYT Weeks in Treatment: 12 Vital Signs Time Taken: 08:13 Temperature (F): 97.9 Height (in): 74 Pulse (bpm): 52 Weight (lbs): 236 Respiratory Rate (breaths/min): 18 Body Mass Index (BMI): 30.3 Blood Pressure (mmHg): 151/88 Reference Range: 80 - 120 mg / dl Electronic Signature(s) Signed: 01/17/2018 4:40:26 PM By: Roger Shelter Entered By: Roger Shelter on 01/17/2018 08:21:20

## 2018-01-23 ENCOUNTER — Encounter: Payer: Medicare Other | Admitting: Physician Assistant

## 2018-01-23 DIAGNOSIS — E11622 Type 2 diabetes mellitus with other skin ulcer: Secondary | ICD-10-CM | POA: Diagnosis not present

## 2018-01-24 ENCOUNTER — Encounter: Payer: Medicare Other | Admitting: Physician Assistant

## 2018-01-24 NOTE — Progress Notes (Signed)
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 good candidate for compression/lymphedema pumps. We have never attempted to get these for him previously. No fevers, chills, nausea, or vomiting noted at this time. Currently patient states his ulcer does hurt although not as bad as some in the past  it's also not as large as it has been in the past when it reopened he talked is somewhat early. Obviously this is good news. KYO, COCUZZA ([id number]) 12/13/17 on evaluation today patient appears to actually be doing very well in regard to his right medial ankle ulcer compared to last week's evaluation. I think having him on the correct antibiotics has made all the difference in the world. With that being said he's not having discomfort like he was previous and in general I feel like he has made excellent strides towards healing in the past week. No fevers, chills, nausea, or vomiting noted at this time. 01/10/18 on evaluation today patient appears to be doing a little worse compared to last week's evaluation. I think that though the Iodoflex has help with cleaning up the surface of the wound subsequently he has having a lot of drainage which I think is causing some issues in that regard. At this point I think we may want to switch the dressings. 01/17/18 on evaluation today patient appears to be doing rather well in regard to his right medial malleolus ulcer. The good news is I do feel like he showing some signs of improvement which is excellent. I feel like that the [facility] Dressing been better for him than the Iodoflex that we will previously utilizing. He is having much less in the way of discomfort which is good news. Overall very pleased in that regard. Nonetheless the appearance of the wound is improved today as well and that is excellent news. He still has pain but not seemingly as much as previous and I don't feel like there's much maceration either. 01/23/18 on evaluation today patient appears in our clinic a day early for his appointment due to the fact that he was having discomfort and issues with his wrap. Upon inspection of the wound actually appears to be doing fairly well which is good news. With that being said he is having a lot of discomfort at this point unfortunately. I  think that this is mainly due to the fact that he is having such drainage as the wound bed itself seems to be doing well. I do believe the [facility] Dressing well is helpful for him which is good news. Nonetheless again I think he may need more  frequent dressing changes. Patient History Information obtained from Patient. Family History Heart Disease - Mother,Father, Hypertension - Mother,Father, Stroke - Father, No family history of Cancer, Diabetes, Hereditary Spherocytosis, Thyroid Problems, Tuberculosis. Social History Current every day smoker, Marital Status - Single, Alcohol Use - Never, Drug Use - No History, Caffeine Use - Daily. Medical History Hospitalization/Surgery History - 05/17/2012, [facility], CVA. - 03/17/2017, [facility], Pneumonia. Medical And Surgical History Notes Ear/Nose/Mouth/Throat difficulty speaking Respiratory Hospitalized for Pneumonia 02/2017 Neurologic CVA in 2014 Review of Systems (ROS) Constitutional Symptoms (General Health) Denies complaints or symptoms of Fever, Chills. Respiratory The patient has no complaints or symptoms. Cardiovascular Complains or has symptoms of LE edema. Psychiatric The patient has no complaints or symptoms. [name], [name] ([id number]) Objective Constitutional Well-nourished and well-hydrated in no acute distress. Vitals Time Taken: 8:53 AM, Height: 74 in, Weight: 236 lbs, BMI: 30.3, Temperature: 97.6 F, Pulse: 53 bpm, Respiratory Rate: 18 breaths/min, Blood Pressure: 144/74 mmHg. Respiratory normal breathing without difficulty. clear to auscultation bilaterally. Cardiovascular regular rate and rhythm with normal S1, S2. trace pitting edema of the bilateral lower extremities. Psychiatric this patient is able to make decisions and demonstrates good insight into disease process. Alert and Oriented x 3. pleasant and cooperative. General Notes: Patient's wound bed at this time actually does show better granulation even this  week compared to last week which is good news. No sharp debridement performed today due to the discomfort that is experiencing but again I believe this is more a issue with moisture control than anything else at this point. He does not seem to have any evidence of infection at this time which is good news. Integumentary (Hair, Skin) Wound #8 status is Open. Original cause of wound was Gradually Appeared. The wound is located on the Right,Proximal,Medial Lower Leg. The wound measures 5.8cm length x 2.5cm width x 0.3cm depth; 11.388cm^2 area and 3.416cm^3 volume. There is Fat Layer (Subcutaneous Tissue) Exposed exposed. There is no tunneling or undermining noted. There is a large amount of purulent drainage noted. Foul odor after cleansing was noted. The wound margin is flat and intact. There is large (67-100%) red, pink granulation within the wound bed. There is a small (1-33%) amount of necrotic tissue within the wound bed including Eschar and Adherent Slough. The periwound skin appearance exhibited: Scarring, Maceration. The periwound skin appearance did not exhibit: Callus, Crepitus, Excoriation, Induration, Rash, Dry/Scaly, Atrophie Blanche, Cyanosis, Ecchymosis, Hemosiderin Staining, Mottled, Pallor, Rubor, Erythema. Periwound temperature was noted as No Abnormality. The periwound has tenderness on palpation. 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 Cleanse wound with mild soap and water Anesthetic (add to Medication List): Wound #8 Right,Proximal,Medial Lower Leg: Topical Lidocaine 4% cream applied to wound bed prior to debridement (In Clinic Only). Skin  Barriers/Peri-Wound Care: Wound #8 Right,Proximal,Medial Lower Leg: Moisturizing lotion - not on wound area Primary Wound Dressing: Wound #8 Right,Proximal,Medial Lower Leg: Hydrafera Blue Ready Transfer Secondary Dressing: Wound #8 Right,Proximal,Medial Lower Leg: ABD pad XtraSorb Dressing Change Frequency: Wound #8 Right,Proximal,Medial Lower Leg: Change dressing every week Follow-up Appointments: Wound #8 Right,Proximal,Medial Lower Leg: Return Appointment in 1 week. Nurse Visit as needed Edema Control: Wound #8 Right,Proximal,Medial Lower Leg: 3 Layer Compression System - Right Lower Extremity - unna to anchor Other: -  order bilateral compression pumps Additional Orders / Instructions: Wound #8 Right,Proximal,Medial Lower Leg: Increase protein intake. My suggestion at this point is gonna be that we actually continue with the above wound care measures although I am going to suggest a nurse visit once a week to change his wrap. At least until the drainage is under better control and this is closer to healing. Patients in agreement the plan. Please see above for specific wound care orders. We will see patient for re-evaluation in 1 week(s) here in the clinic. If anything worsens or changes patient will contact our office for additional recommendations. Electronic Signature(s) Signed: 01/23/2018 12:14:07 PM By: Worthy Keeler PA-C Entered By: Worthy Keeler on 01/23/2018 09:21:08 [name], [name] ([id number]) -------------------------------------------------------------------------------- ROS/PFSH Details Patient Name: [name] Date of Service: 01/23/2018 8:30 AM Medical Record Number: [id number] Patient Account Number: [id number] Date of Birth/Sex: 1950-08-31 (67 y.o. M) Treating RN: Roger Shelter Primary Care Provider: Royetta Crochet Other Clinician: Referring Provider: Royetta Crochet Treating Provider/Extender: Melburn Hake, HOYT Weeks in Treatment:  12 Information Obtained From Patient Wound History Do you currently have one or more open woundso Yes How many open wounds do you currently haveo 1 Approximately how long have you had your woundso 1 week How have you been treating your wound(s) until nowo lotion Has your wound(s) ever healed and then re-openedo Yes Have you had any lab work done in the past montho No Have you tested positive for an antibiotic resistant organism (MRSA, VRE)o No Have you tested positive for osteomyelitis (bone infection)o No Have you had any tests for circulation on your legso Yes Who ordered the testo [facility] Where was the test doneo [facility] Constitutional Symptoms (General Health) Complaints and Symptoms: Negative for: Fever; Chills Cardiovascular Complaints and Symptoms: Positive for: LE edema Medical History: Positive for: Arrhythmia - a fib; Deep Vein Thrombosis; Hypertension Negative for: Angina; Congestive Heart Failure; Coronary Artery Disease; Hypotension; Myocardial Infarction; Peripheral Arterial Disease; Peripheral Venous Disease; Phlebitis; Vasculitis Eyes Medical History: Negative for: Cataracts; Glaucoma; Optic Neuritis Ear/Nose/Mouth/Throat Medical History: Negative for: Chronic sinus problems/congestion; Middle ear problems Past Medical History Notes: difficulty speaking Hematologic/Lymphatic Medical History: Negative for: Anemia; Hemophilia; Human Immunodeficiency Virus; Lymphedema; Sickle Cell Disease Respiratory [place], Pope ([id number]) Complaints and Symptoms: No Complaints or Symptoms Medical History: Positive for: Chronic Obstructive Pulmonary Disease (COPD) Negative for: Aspiration; Asthma; Pneumothorax; Sleep Apnea; Tuberculosis Past Medical History Notes: Hospitalized for Pneumonia 02/2017 Gastrointestinal Medical History: Negative for: Cirrhosis ; Colitis; Crohnos; Hepatitis A; Hepatitis B; Hepatitis C Endocrine Medical History: Positive for: Type II  Diabetes Negative for: Type I Diabetes Time with diabetes: since 2017 Treated with: Diet Blood sugar tested every day: No Genitourinary Medical History: Negative for: End Stage Renal Disease Immunological Medical History: Negative for: Lupus Erythematosus; Raynaudos; Scleroderma Integumentary (Skin) Medical History: Negative for: History of Burn; History of pressure wounds Musculoskeletal Medical History: Positive for: Gout Negative for: Rheumatoid Arthritis; Osteoarthritis; Osteomyelitis Neurologic Medical History: Positive for: Neuropathy Negative for: Dementia; Quadriplegia; Paraplegia; Seizure Disorder Past Medical History Notes: CVA in 2014 Oncologic Medical History: Negative for: Received Chemotherapy; Received Radiation Psychiatric JOURDIN, CONNORS ([id number]) Complaints and Symptoms: No Complaints or Symptoms Medical History: Negative for: Anorexia/bulimia; Confinement Anxiety Immunizations Pneumococcal Vaccine: Received Pneumococcal Vaccination: Yes Immunization Notes: up to date Implantable Devices Hospitalization / Surgery History Name of Hospital Purpose of Hospitalization/Surgery Date [place] CVA 05/17/2012 [place] Pneumonia 03/17/2017 Family and Social History Cancer: No; Diabetes: No; Heart Disease: Yes - Mother,Father; Hereditary Spherocytosis: No; Hypertension: Yes - Mother,Father;  Stroke: Yes - Father; Thyroid Problems: No; Tuberculosis: No; Current every day smoker; Marital Status - Single; Alcohol Use: Never; Drug Use: No History; Caffeine Use: Daily; Financial Concerns: No; Food, Clothing or Shelter Needs: No; Support System Lacking: No; Transportation Concerns: No; Advanced Directives: No; Patient does not want information on Advanced Directives Physician Affirmation I have reviewed and agree with the above information. Electronic Signature(s) Signed: 01/23/2018 12:14:07 PM By: Worthy Keeler PA-C Signed: 01/23/2018 4:39:16 PM By: Roger Shelter Entered By: Worthy Keeler on 01/23/2018 09:20:03 Marrin, [name] ([id number]) -------------------------------------------------------------------------------- SuperBill Details Patient Name: [name] Date of Service: 01/23/2018 Medical Record Number: [id number] Patient Account Number: [id number] Date of Birth/Sex: Apr 08, 1951 (67 y.o. M) Treating RN: Roger Shelter Primary Care Provider: Royetta Crochet Other Clinician: Referring Provider: Royetta Crochet Treating Provider/Extender: Melburn Hake, HOYT Weeks in Treatment: 12 Diagnosis Coding ICD-10 Codes Code Description I87.2 Venous insufficiency (chronic) (peripheral) I89.0 Lymphedema, not elsewhere classified E11.622 Type 2 diabetes mellitus with other skin ulcer L97.812 Non-pressure chronic ulcer of other part of right lower leg with fat layer exposed Facility Procedures CPT4 Code: 35521747 Description: 15953 - WOUND CARE VISIT-LEV 2 EST PT Modifier: Quantity: 1 Physician Procedures CPT4 Code Description: 9672897 91504 - WC PHYS LEVEL 3 - EST PT ICD-10 Diagnosis Description I87.2 Venous insufficiency (chronic) (peripheral) I89.0 Lymphedema, not elsewhere classified E11.622 Type 2 diabetes mellitus with other skin ulcer L97.812  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

## 2018-01-24 NOTE — Progress Notes (Signed)
[name], [name] ([id number]) Visit Report for 01/23/2018 Arrival Information Details Patient Name: [name], [name] Date of Service: 01/23/2018 8:30 AM Medical Record Number: [id number] Patient Account Number: [id number] Date of Birth/Sex: 1950-10-22 (67 y.o. M) Treating RN: [name] Primary Care [name]: [name] Other Clinician: Referring [name]: [name] Treating [name]: Mark Hake, [name]: 12 Visit Information History Since Last Visit All ordered tests and consults were completed: No Patient Arrived: Ambulatory Added or deleted any medications: No Arrival Time: 08:51 Any new allergies or adverse reactions: No Accompanied By: self Had a fall or experienced change in No Transfer Assistance: None activities of daily living that may affect Patient Requires Transmission-Based No risk of falls: Precautions: Signs or symptoms of abuse/neglect since last visito No Patient Has Alerts: Yes Hospitalized since last visit: No Patient Alerts: Patient on Blood Implantable device outside of the clinic excluding No Thinner cellular tissue based products placed in the center DMII since last visit: warfarin Pain Present Now: Yes Electronic Signature(s) Signed: 01/23/2018 4:39:16 PM By: [name] Entered By: [name] on 01/23/2018 08:53:28 [name] ([id number]) -------------------------------------------------------------------------------- Clinic Level of Care Assessment Details Patient Name: [name] Date of Service: 01/23/2018 8:30 AM Medical Record Number: [id number] Patient Account Number: [id number] Date of Birth/Sex: 1950-11-12 (67 y.o. M) Treating RN: [name] Primary Care [name]: [name] Other Clinician: Referring [name]: [name] Treating Paysen Goza/Extender: Mark Hake, [name]: 12 Clinic Level of Care Assessment Items TOOL 4 Quantity Score X - Use when  only an EandM is performed on FOLLOW-UP visit 1 0 ASSESSMENTS - Nursing Assessment / Reassessment []  - Reassessment of Co-morbidities (includes updates in patient status) 0 X- 1 5 Reassessment of Adherence to [name] Plan ASSESSMENTS - Wound and Skin Assessment / Reassessment X - Simple Wound Assessment / Reassessment - one wound 1 5 []  - 0 Complex Wound Assessment / Reassessment - multiple wounds []  - 0 Dermatologic / Skin Assessment (not related to wound area) ASSESSMENTS - Focused Assessment []  - Circumferential Edema Measurements - multi extremities 0 []  - 0 Nutritional Assessment / Counseling / Intervention []  - 0 Lower Extremity Assessment (monofilament, tuning fork, pulses) []  - 0 Peripheral Arterial Disease Assessment (using hand held doppler) ASSESSMENTS - Ostomy and/or Continence Assessment and Care []  - Incontinence Assessment and Management 0 []  - 0 Ostomy Care Assessment and Management (repouching, etc.) PROCESS - Coordination of Care X - Simple Patient / Family Education for ongoing care 1 15 []  - 0 Complex (extensive) Patient / Family Education for ongoing care []  - 0 Staff obtains Programmer, systems, Records, Test Results / Process Orders []  - 0 Staff telephones HHA, Nursing Homes / Clarify orders / etc []  - 0 Routine Transfer to another Facility (non-emergent condition) []  - 0 Routine Hospital Admission (non-emergent condition) []  - 0 New Admissions / Biomedical engineer / Ordering NPWT, Apligraf, etc. []  - 0 Emergency Hospital Admission (emergent condition) X- 1 10 Simple Discharge Coordination [name] ([id number]) []  - 0 Complex (extensive) Discharge Coordination PROCESS - Special Needs []  - Pediatric / Minor Patient Management 0 []  - 0 Isolation Patient Management []  - 0 Hearing / Language / Visual special needs []  - 0 Assessment of Community assistance (transportation, D/C planning, etc.) []  - 0 Additional assistance / Altered  mentation []  - 0 Support Surface(s) Assessment (bed, cushion, seat, etc.) INTERVENTIONS - Wound Cleansing / Measurement X - Simple Wound Cleansing - one wound 1 5 []  - 0 Complex Wound Cleansing - multiple wounds X-  1 5 Wound Imaging (photographs - any number of wounds) []  - 0 Wound Tracing (instead of photographs) X- 1 5 Simple Wound Measurement - one wound []  - 0 Complex Wound Measurement - multiple wounds INTERVENTIONS - Wound Dressings X - Small Wound Dressing one or multiple wounds 1 10 []  - 0 Medium Wound Dressing one or multiple wounds []  - 0 Large Wound Dressing one or multiple wounds []  - 0 Application of Medications - topical []  - 0 Application of Medications - injection INTERVENTIONS - Miscellaneous []  - External ear exam 0 []  - 0 Specimen Collection (cultures, biopsies, blood, body fluids, etc.) []  - 0 Specimen(s) / Culture(s) sent or taken to Lab for analysis []  - 0 Patient Transfer (multiple staff / Civil Service fast streamer / Similar devices) []  - 0 Simple Staple / Suture removal (25 or less) []  - 0 Complex Staple / Suture removal (26 or more) []  - 0 Hypo / Hyperglycemic Management (close monitor of Blood Glucose) []  - 0 Ankle / Brachial Index (ABI) - do not check if billed separately X- 1 5 Vital Signs [name], Mark ([id number]) Has the patient been seen at the hospital within the last three years: Yes Total Score: 65 Level Of Care: New/Established - Level 2 Electronic Signature(s) Signed: 01/23/2018 4:39:16 PM By: [name] Entered By: [name] on 01/23/2018 09:16:15 [name] ([id number]) -------------------------------------------------------------------------------- Encounter Discharge Information Details Patient Name: [name] Date of Service: 01/23/2018 8:30 AM Medical Record Number: [id number] Patient Account Number: [id number] Date of Birth/Sex: 16-May-1951 (67 y.o. M) Treating RN: [name] Primary Care  Kolbee Bogusz: [name] Other Clinician: Referring Daqwan Dougal: [name] Treating [name]: Mark Hake, [name]: 12 Encounter Discharge Information Items Discharge Condition: Stable Ambulatory Status: Ambulatory Discharge Destination: Home Transportation: Private Auto Schedule Follow-up Appointment: Yes Clinical Summary of Care: Electronic Signature(s) Signed: 01/23/2018 4:39:16 PM By: [name] Entered By: [name] on 01/23/2018 09:26:57 Mapp, [name] ([id number]) -------------------------------------------------------------------------------- Lower Extremity Assessment Details Patient Name: [name] Date of Service: 01/23/2018 8:30 AM Medical Record Number: [id number] Patient Account Number: [id number] Date of Birth/Sex: Sep 08, 1950 (67 y.o. M) Treating RN: [name] Primary Care Mattison Stuckey: [name] Other Clinician: Referring Jacobi Ryant: [name] Treating Brunetta Newingham/Extender: Mark Hake, [name]: 12 Edema Assessment Assessed: [Left: No] [Right: No] E[Left: dema] [Right: :] Calf Left: Right: Point of Measurement: 38 cm From Medial Instep cm 41 cm Ankle Left: Right: Point of Measurement: 14 cm From Medial Instep cm 27 cm Vascular Assessment Claudication: Claudication Assessment [Right:None] Pulses: Dorsalis Pedis Palpable: [Right:Yes] Posterior Tibial Extremity colors, hair growth, and conditions: Extremity Color: [Right:Hyperpigmented] Hair Growth on Extremity: [Right:No] Temperature of Extremity: [Right:Warm] Capillary Refill: [Right:< 3 seconds] Toe Nail Assessment Left: Right: Thick: Yes Discolored: Yes Deformed: Yes Improper Length and Hygiene: No Electronic Signature(s) Signed: 01/23/2018 4:39:16 PM By: [name] Entered By: [name] on 01/23/2018 09:02:21 Yaun, [name]  ([id number]) -------------------------------------------------------------------------------- Multi Wound Chart Details Patient Name: [name] Date of Service: 01/23/2018 8:30 AM Medical Record Number: [id number] Patient Account Number: [id number] Date of Birth/Sex: 03/25/1951 (67 y.o. M) Treating RN: [name] Primary Care Ercel Normoyle: [name] Other Clinician: Referring Annalena Piatt: [name] Treating Demetric Dunnaway/Extender: STONE III, [name]: 12 Vital Signs Height(in): 74 Pulse(bpm): 53 Weight(lbs): 236 Blood Pressure(mmHg): 144/74 Body Mass Index(BMI): 30 Temperature(F): 97.6 Respiratory Rate 18 (breaths/min): Photos: [N/A:N/A] Wound Location: Right Lower Leg - Medial, N/A N/A Proximal Wounding Event: Gradually Appeared N/A N/A Primary Etiology: Diabetic Wound/Ulcer of the N/A N/A Lower Extremity Secondary  Etiology: Lymphedema N/A N/A Comorbid History: Chronic Obstructive N/A N/A Pulmonary Disease (COPD), Arrhythmia, Deep Vein Thrombosis, Hypertension, Type II Diabetes, Gout, Neuropathy Date Acquired: 10/17/2017 N/A N/A Weeks of [name]: 12 N/A N/A Wound Status: Open N/A N/A Measurements L x W x D 5.8x2.5x0.3 N/A N/A (cm) Area (cm) : 11.388 N/A N/A Volume (cm) : 3.416 N/A N/A % Reduction in Area: -6801.80% N/A N/A % Reduction in Volume: -21250.00% N/A N/A Classification: Grade 2 N/A N/A Exudate Amount: Large N/A N/A Exudate Type: Purulent N/A N/A Exudate Color: yellow, brown, green N/A N/A Foul Odor After Cleansing: Yes N/A N/A Odor Anticipated Due to No N/A N/A Product Use: Wound Margin: Flat and Intact N/A N/A Scharf, Rakim ([id number]) Granulation Amount: Large (67-100%) N/A N/A Granulation Quality: Red, Pink N/A N/A Necrotic Amount: Small (1-33%) N/A N/A Necrotic Tissue: Eschar, Adherent Slough N/A N/A Exposed Structures: Fat Layer (Subcutaneous N/A N/A Tissue) Exposed: Yes Fascia: No Tendon: No Muscle:  No Joint: No Bone: No Epithelialization: None N/A N/A Periwound Skin Texture: Scarring: Yes N/A N/A Excoriation: No Induration: No Callus: No Crepitus: No Rash: No Periwound Skin Moisture: Maceration: Yes N/A N/A Dry/Scaly: No Periwound Skin Color: Atrophie Blanche: No N/A N/A Cyanosis: No Ecchymosis: No Erythema: No Hemosiderin Staining: No Mottled: No Pallor: No Rubor: No Temperature: No Abnormality N/A N/A Tenderness on Palpation: Yes N/A N/A Wound Preparation: Ulcer Cleansing: N/A N/A Rinsed/Irrigated with Saline, Other: soap and water Topical Anesthetic Applied: Other: lidocaine 4% [name] Notes Electronic Signature(s) Signed: 01/23/2018 4:39:16 PM By: [name] Entered By: [name] on 01/23/2018 09:10:03 Placzek, [name] ([id number]) -------------------------------------------------------------------------------- [place] Details Patient Name: [name] Date of Service: 01/23/2018 8:30 AM Medical Record Number: [id number] Patient Account Number: [id number] Date of Birth/Sex: 21-Dec-1950 (67 y.o. M) Treating RN: [name] Primary Care Ingra Rother: [name] Other Clinician: Referring Ian Cavey: [name] Treating Micca Matura/Extender: Mark Hake, [name]: 12 Active Inactive ` Abuse / Safety / Falls / Self Care Management Nursing Diagnoses: Potential for falls Goals: Patient will not experience any injury related to falls Date Initiated: 10/25/2017 Target Resolution Date: 02/18/2018 Goal Status: Active Interventions: Assess Activities of Daily Living upon admission and as needed Assess fall risk on admission and as needed Assess: immobility, friction, shearing, incontinence upon admission and as needed Assess impairment of mobility on admission and as needed per policy Assess personal safety and home safety (as indicated) on admission and as needed Assess self care needs on admission and  as needed Notes: ` Nutrition Nursing Diagnoses: Imbalanced nutrition Impaired glucose control: actual or potential Potential for alteratiion in Nutrition/Potential for imbalanced nutrition Goals: Patient/caregiver agrees to and verbalizes understanding of need to use nutritional supplements and/or vitamins as prescribed Date Initiated: 10/25/2017 Target Resolution Date: 02/18/2018 Goal Status: Active Patient/caregiver will maintain therapeutic glucose control Date Initiated: 10/25/2017 Target Resolution Date: 02/18/2018 Goal Status: Active Interventions: Assess patient nutrition upon admission and as needed per policy Provide education on elevated blood sugars and impact on wound healing Provide education on nutrition CHASKE, PASKETT ([id number]) Notes: ` Orientation to the Wound Care Program Nursing Diagnoses: Knowledge deficit related to the wound healing center program Goals: Patient/caregiver will verbalize understanding of the [facility] Date Initiated: 10/25/2017 Target Resolution Date: 11/19/2017 Goal Status: Active Interventions: Provide education on orientation to the wound center Notes: ` Wound/Skin Impairment Nursing Diagnoses: Impaired tissue integrity Knowledge deficit related to ulceration/compromised skin integrity Goals: Ulcer/skin breakdown will have a volume reduction of 80% by week 12 Date Initiated:  10/25/2017 Target Resolution Date: 02/11/2018 Goal Status: Active Interventions: Assess patient/caregiver ability to perform ulcer/skin care regimen upon admission and as needed Assess ulceration(s) every visit Notes: Electronic Signature(s) Signed: 01/23/2018 4:39:16 PM By: [name] Entered By: [name] on 01/23/2018 09:14:43 Pagnotta, [name] ([id number]) -------------------------------------------------------------------------------- Pain Assessment Details Patient Name: [name] Date of Service: 01/23/2018  8:30 AM Medical Record Number: [id number] Patient Account Number: [id number] Date of Birth/Sex: 09/22/1950 (67 y.o. M) Treating RN: [name] Primary Care Caela Huot: [name] Other Clinician: Referring Romelle Muldoon: [name] Treating Jewelianna Pancoast/Extender: STONE III, [name]: 12 Active Problems Location of Pain Severity and Description of Pain Patient Has Paino Yes Site Locations Rate the pain. Current Pain Level: 2 Character of Pain Describe the Pain: Burning Pain Management and Medication Current Pain Management: Electronic Signature(s) Signed: 01/23/2018 4:39:16 PM By: [name] Entered By: [name] on 01/23/2018 08:53:56 Edgin, [name] ([id number]) -------------------------------------------------------------------------------- Patient/Caregiver Education Details Patient Name: [name] Date of Service: 01/23/2018 8:30 AM Medical Record Number: [id number] Patient Account Number: [id number] Date of Birth/Gender: 02-Jun-1950 (67 y.o. M) Treating RN: [name] Primary Care Physician: [name] Other Clinician: Referring Physician: Royetta [name] Treating Physician/Extender: Sharalyn Ink in [name]: 12 Education Assessment Education Provided To: Patient Education Topics Provided Wound Debridement: Wound/Skin Impairment: Handouts: Caring for Your Ulcer Methods: Explain/Verbal Responses: State content correctly Electronic Signature(s) Signed: 01/23/2018 4:39:16 PM By: [name] Entered By: [name] on 01/23/2018 09:27:18 Dumas, [name] ([id number]) -------------------------------------------------------------------------------- Wound Assessment Details Patient Name: [name] Date of Service: 01/23/2018 8:30 AM Medical Record Number: [id number] Patient Account Number: [id number] Date of Birth/Sex: [birth year] (67 y.o. M) Treating RN: [name] Primary Care Tasheba Henson:  [name] Other Clinician: Referring Iridessa Harrow: [name] Treating Matty Deamer/Extender: STONE III, [name]: 12 Wound Status Wound Number: 8 Primary Diabetic Wound/Ulcer of the Lower Extremity Etiology: Wound Location: Right Lower Leg - Medial, Proximal Secondary Lymphedema Wounding Event: Gradually Appeared Etiology: Date Acquired: 10/17/2017 Wound Open Weeks Of [name]: 12 Status: Clustered Wound: No Comorbid Chronic Obstructive Pulmonary Disease History: (COPD), Arrhythmia, Deep Vein Thrombosis, Hypertension, Type II Diabetes, Gout, Neuropathy Photos Photo Uploaded By: Secundino Ginger on 01/23/2018 09:08:31 Wound Measurements Length: (cm) 5.8 Width: (cm) 2.5 Depth: (cm) 0.3 Area: (cm) 11.388 Volume: (cm) 3.416 % Reduction in Area: -6801.8% % Reduction in Volume: -21250% Epithelialization: None Tunneling: No Undermining: No Wound Description Classification: Grade 2 Foul Odo Wound Margin: Flat and Intact Due to P Exudate Amount: Large Slough/F Exudate Type: Purulent Exudate Color: yellow, brown, green r After Cleansing: Yes roduct Use: No ibrino Yes Wound Bed Granulation Amount: Large (67-100%) Exposed Structure Granulation Quality: Red, Pink Fascia Exposed: No Necrotic Amount: Small (1-33%) Fat Layer (Subcutaneous Tissue) Exposed: Yes Necrotic Quality: Eschar, Adherent Slough Tendon Exposed: No Muscle Exposed: No Joint Exposed: No Kernen, Naeem ([id number]) Bone Exposed: No Periwound Skin Texture Texture Color No Abnormalities Noted: No No Abnormalities Noted: No Callus: No Atrophie Blanche: No Crepitus: No Cyanosis: No Excoriation: No Ecchymosis: No Induration: No Erythema: No Rash: No Hemosiderin Staining: No Scarring: Yes Mottled: No Pallor: No Moisture Rubor: No No Abnormalities Noted: No Dry / Scaly: No Temperature / Pain Maceration: Yes Temperature: No Abnormality Tenderness on Palpation: Yes Wound  Preparation Ulcer Cleansing: Rinsed/Irrigated with Saline, Other: soap and water, Topical Anesthetic Applied: Other: lidocaine 4%, [name] Notes Wound #8 (Right, Proximal, Medial Lower Leg) 1. Cleansed with: Clean wound with Normal Saline 2. Anesthetic Topical Lidocaine 4% cream to wound bed prior to debridement 3. Peri-wound  Care: Moisturizing lotion 4. Dressing Applied: Hydrafera Blue 5. Secondary Dressing Applied ABD Pad 7. Secured with 3 Layer Compression System - Right Lower Extremity Notes xtrasorb, unna to Engineer, production) Signed: 01/23/2018 4:39:16 PM By: [name] Entered By: [name] on 01/23/2018 09:00:53 Kellett, [name] ([id number]) -------------------------------------------------------------------------------- Vitals Details Patient Name: [name] Date of Service: 01/23/2018 8:30 AM Medical Record Number: [id number] Patient Account Number: [id number] Date of Birth/Sex: 22-Jul-1950 (67 y.o. M) Treating RN: [name] Primary Care Nathaneal Sommers: [name] Other Clinician: Referring Laval Cafaro: [name] Treating Lacee Grey/Extender: STONE III, [name]: 12 Vital Signs Time Taken: 08:53 Temperature (F): 97.6 Height (in): 74 Pulse (bpm): 53 Weight (lbs): 236 Respiratory Rate (breaths/min): 18 Body Mass Index (BMI): 30.3 Blood Pressure (mmHg): 144/74 Reference Range: 80 - 120 mg / dl Electronic Signature(s) Signed: 01/23/2018 4:39:16 PM By: [name] Entered By: [name] on 01/23/2018 08:54:20

## 2018-01-26 DIAGNOSIS — E11622 Type 2 diabetes mellitus with other skin ulcer: Secondary | ICD-10-CM | POA: Diagnosis not present

## 2018-01-31 ENCOUNTER — Encounter: Payer: Medicare Other | Admitting: Physician Assistant

## 2018-01-31 DIAGNOSIS — E11622 Type 2 diabetes mellitus with other skin ulcer: Secondary | ICD-10-CM | POA: Diagnosis not present

## 2018-01-31 NOTE — Progress Notes (Signed)
Mark, [name] ([id number]) Visit Report for 01/26/2018 Arrival Information Details Patient Name: [name], [name] Date of Service: 01/26/2018 3:45 PM Medical Record Number: [id number] Patient Account Number: [id number] Date of Birth/Sex: 11/15/1950 (67 y.o. M) Treating RN: Montey Hora Primary Care Haliey Romberg: Royetta Crochet Other Clinician: Referring Nayleen Janosik: Royetta Crochet Treating Axl Rodino/Extender: Melburn Hake, HOYT Weeks in Treatment: 16 Visit Information History Since Last Visit Added or deleted any medications: No Patient Arrived: Ambulatory Any new allergies or adverse reactions: No Arrival Time: 15:50 Had a fall or experienced change in No Accompanied By: self activities of daily living that may affect Transfer Assistance: None risk of falls: Patient Identification Verified: Yes Signs or symptoms of abuse/neglect since last visito No Secondary Verification Process Yes Hospitalized since last visit: No Completed: Implantable device outside of the clinic excluding No Patient Requires Transmission-Based No cellular tissue based products placed in the center Precautions: since last visit: Patient Has Alerts: Yes Has Dressing in Place as Prescribed: Yes Patient Alerts: Patient on Blood Has Compression in Place as Prescribed: Yes Thinner Pain Present Now: Yes DMII warfarin Electronic Signature(s) Signed: 01/26/2018 4:12:26 PM By: Montey Hora Entered By: Montey Hora on 01/26/2018 16:12:26 Trimm, [name] ([id number]) -------------------------------------------------------------------------------- Compression Therapy Details Patient Name: [name] Date of Service: 01/26/2018 3:45 PM Medical Record Number: [id number] Patient Account Number: [id number] Date of Birth/Sex: 1950/09/30 (67 y.o. M) Treating RN: Montey Hora Primary Care Nezzie Manera: Royetta Crochet Other Clinician: Referring Kaylaann Mountz: Royetta Crochet Treating Lorayne Getchell/Extender: STONE  III, HOYT Weeks in Treatment: 13 Compression Therapy Performed for Wound Assessment: Wound #8 Right,Proximal,Medial Lower Leg Performed By: Clinician Montey Hora, RN Compression Type: Three Layer Pre Treatment ABI: 1.2 Electronic Signature(s) Signed: 01/26/2018 4:13:13 PM By: Montey Hora Entered By: Montey Hora on 01/26/2018 16:13:13 Kirkey, [name] ([id number]) -------------------------------------------------------------------------------- Encounter Discharge Information Details Patient Name: [name] Date of Service: 01/26/2018 3:45 PM Medical Record Number: [id number] Patient Account Number: [id number] Date of Birth/Sex: 19-Aug-1950 (67 y.o. M) Treating RN: Montey Hora Primary Care Tabbetha Kutscher: Royetta Crochet Other Clinician: Referring Eural Holzschuh: Royetta Crochet Treating Joah Patlan/Extender: Melburn Hake, HOYT Weeks in Treatment: 13 Encounter Discharge Information Items Discharge Condition: Stable Ambulatory Status: Ambulatory Discharge Destination: Home Transportation: Private Auto Accompanied By: self Schedule Follow-up Appointment: No Clinical Summary of Care: Electronic Signature(s) Signed: 01/26/2018 4:14:49 PM By: Montey Hora Entered By: Montey Hora on 01/26/2018 16:14:48 Maggi, [name] ([id number]) -------------------------------------------------------------------------------- Pain Assessment Details Patient Name: [name] Date of Service: 01/26/2018 3:45 PM Medical Record Number: [id number] Patient Account Number: [id number] Date of Birth/Sex: Jan 27, 1951 (67 y.o. M) Treating RN: Montey Hora Primary Care Alica Shellhammer: Royetta Crochet Other Clinician: Referring Reilyn Nelson: Royetta Crochet Treating Laila Myhre/Extender: STONE III, HOYT Weeks in Treatment: 13 Active Problems Location of Pain Severity and Description of Pain Patient Has Paino Yes Site Locations Pain Location: Pain in Ulcers With Dressing Change: Yes Duration of the  Pain. Constant / Intermittento Constant Pain Management and Medication Current Pain Management: Electronic Signature(s) Signed: 01/26/2018 4:13:34 PM By: Montey Hora Entered By: Montey Hora on 01/26/2018 16:13:34 Sobalvarro, [name] ([id number]) -------------------------------------------------------------------------------- Patient/Caregiver Education Details Patient Name: [name] Date of Service: 01/26/2018 3:45 PM Medical Record Number: [id number] Patient Account Number: [id number] Date of Birth/Gender: [birth year] (67 y.o. M) Treating RN: Montey Hora Primary Care Physician: Royetta Crochet Other Clinician: Referring Physician: Royetta Crochet Treating Physician/Extender: Sharalyn Ink in Treatment: 13 Education Assessment Education Provided To: Patient Education Topics Provided Venous: Handouts: Other: please use your pumps Methods: Explain/Verbal Responses: State content correctly Electronic Signature(s) Signed: 01/26/2018 5:06:40 PM By: Montey Hora Entered  By: Montey Hora on 01/26/2018 16:14:35 Hinchcliff, [name] ([id number]) -------------------------------------------------------------------------------- Wound Assessment Details Patient Name: [name], Mark Date of Service: 01/26/2018 3:45 PM Medical Record Number: [id number] Patient Account Number: [id number] Date of Birth/Sex: Dec 17, 1950 (67 y.o. M) Treating RN: Montey Hora Primary Care Dequandre Cordova: Royetta Crochet Other Clinician: Referring Leyanna Bittman: Royetta Crochet Treating Kaiyu Mirabal/Extender: STONE III, HOYT Weeks in Treatment: 13 Wound Status Wound Number: 8 Primary Diabetic Wound/Ulcer of the Lower Extremity Etiology: Wound Location: Right Lower Leg - Medial, Proximal Secondary Lymphedema Wounding Event: Gradually Appeared Etiology: Date Acquired: 10/17/2017 Wound Open Weeks Of Treatment: 13 Status: Clustered Wound: No Comorbid Chronic Obstructive Pulmonary Disease History:  (COPD), Arrhythmia, Deep Vein Thrombosis, Hypertension, Type II Diabetes, Gout, Neuropathy Wound Measurements Length: (cm) 5.8 Width: (cm) 2.5 Depth: (cm) 0.3 Area: (cm) 11.388 Volume: (cm) 3.416 % Reduction in Area: -6801.8% % Reduction in Volume: -21250% Epithelialization: None Tunneling: No Undermining: No Wound Description Classification: Grade 2 Foul Odo Wound Margin: Flat and Intact Due to P Exudate Amount: Large Slough/F Exudate Type: Purulent Exudate Color: yellow, brown, green r After Cleansing: Yes roduct Use: No ibrino Yes Wound Bed Granulation Amount: Large (67-100%) Exposed Structure Granulation Quality: Red, Pink Fascia Exposed: No Necrotic Amount: Small (1-33%) Fat Layer (Subcutaneous Tissue) Exposed: Yes Necrotic Quality: Eschar, Adherent Slough Tendon Exposed: No Muscle Exposed: No Joint Exposed: No Bone Exposed: No Periwound Skin Texture Texture Color No Abnormalities Noted: No No Abnormalities Noted: No Callus: No Atrophie Blanche: No Crepitus: No Cyanosis: No Excoriation: No Ecchymosis: No Induration: No Erythema: No Rash: No Hemosiderin Staining: No Scarring: Yes Mottled: No Pallor: No Moisture Rubor: No Sarwar, Ary ([id number]) No Abnormalities Noted: No Temperature / Pain Dry / Scaly: No Temperature: No Abnormality Maceration: Yes Tenderness on Palpation: Yes Wound Preparation Ulcer Cleansing: Rinsed/Irrigated with Saline, Other: soap and water, Topical Anesthetic Applied: None Treatment Notes Wound #8 (Right, Proximal, Medial Lower Leg) 1. Cleansed with: Cleanse wound with antibacterial soap and water 3. Peri-wound Care: Moisturizing lotion 4. Dressing Applied: Hydrafera Blue Other dressing (specify in notes) 5. Secondary Dressing Applied ABD Pad 7. Secured with 3 Layer Compression System - Right Lower Extremity Notes xtrasorb, unna to Engineer, production) Signed: 01/26/2018 4:12:52 PM By:  Montey Hora Entered By: Montey Hora on 01/26/2018 16:12:52

## 2018-02-02 NOTE — Progress Notes (Signed)
DEO, MEHRINGER ([id number]) Visit Report for 01/31/2018 Arrival Information Details Patient Name: [name], [name] Date of Service: 01/31/2018 8:00 AM Medical Record Number: [id number] Patient Account Number: [id number] Date of Birth/Sex: Dec 16, 1950 (67 y.o. M) Treating RN: Cornell Barman Primary Care Trenton Passow: Royetta Crochet Other Clinician: Referring Tramane Gorum: Royetta Crochet Treating Mayfield Schoene/Extender: Melburn Hake, HOYT Weeks in Treatment: 14 Visit Information History Since Last Visit Added or deleted any medications: No Patient Arrived: Ambulatory Any new allergies or adverse reactions: No Arrival Time: 08:07 Had a fall or experienced change in No Accompanied By: self activities of daily living that may affect Transfer Assistance: None risk of falls: Patient Identification Verified: Yes Signs or symptoms of abuse/neglect since last visito No Secondary Verification Process Yes Hospitalized since last visit: No Completed: Implantable device outside of the clinic excluding No Patient Requires Transmission-Based No cellular tissue based products placed in the center Precautions: since last visit: Patient Has Alerts: Yes Has Dressing in Place as Prescribed: Yes Patient Alerts: Patient on Blood Has Compression in Place as Prescribed: Yes Thinner Pain Present Now: Yes DMII warfarin Electronic Signature(s) Signed: 01/31/2018 8:42:56 AM By: Gretta Cool, [name], RN, CWS, [name], [name] Entered By: Gretta Cool, [name], RN, CWS, Kim on 01/31/2018 08:07:36 Netzel, [name] ([id number]) -------------------------------------------------------------------------------- Encounter Discharge Information Details Patient Name: [name] Date of Service: 01/31/2018 8:00 AM Medical Record Number: [id number] Patient Account Number: [id number] Date of Birth/Sex: [birth year] (67 y.o. M) Treating RN: Montey Hora Primary Care Renee Erb: Royetta Crochet Other Clinician: Referring Tatiyanna Lashley: Royetta Crochet Treating Sisto Granillo/Extender: Melburn Hake, HOYT Weeks in Treatment: 14 Encounter Discharge Information Items Discharge Condition: Stable Ambulatory Status: Ambulatory Discharge Destination: Home Transportation: Private Auto Accompanied By: self Schedule Follow-up Appointment: Yes Clinical Summary of Care: Post Procedure Vitals: Temperature (F): 97.6 Pulse (bpm): 78 Respiratory Rate (breaths/min): 18 Blood Pressure (mmHg): 145/86 Electronic Signature(s) Signed: 01/31/2018 9:08:22 AM By: Montey Hora Entered By: Montey Hora on 01/31/2018 09:08:22 Carswell, [name] ([id number]) -------------------------------------------------------------------------------- Lower Extremity Assessment Details Patient Name: [name] Date of Service: 01/31/2018 8:00 AM Medical Record Number: [id number] Patient Account Number: [id number] Date of Birth/Sex: 01-03-1951 (67 y.o. M) Treating RN: Cornell Barman Primary Care Oaklie Durrett: Royetta Crochet Other Clinician: Referring Kurt Hoffmeier: Royetta Crochet Treating Almedia Cordell/Extender: Melburn Hake, HOYT Weeks in Treatment: 14 Edema Assessment Assessed: [Left: No] [Right: No] E[Left: dema] [Right: :] Calf Left: Right: Point of Measurement: 38 cm From Medial Instep cm 43 cm Ankle Left: Right: Point of Measurement: 14 cm From Medial Instep cm 28.5 cm Vascular Assessment Pulses: Dorsalis Pedis Palpable: [Right:Yes] Posterior Tibial Extremity colors, hair growth, and conditions: Extremity Color: [Right:Hyperpigmented] Hair Growth on Extremity: [Right:No] Temperature of Extremity: [Right:Warm] Capillary Refill: [Right:< 3 seconds] Toe Nail Assessment Left: Right: Thick: Yes Discolored: Yes Deformed: Yes Improper Length and Hygiene: No Electronic Signature(s) Signed: 01/31/2018 8:42:56 AM By: Gretta Cool, [name], RN, CWS, [name], [name] Entered By: Gretta Cool, [name], RN, CWS, Kim on 01/31/2018 08:13:38 Hocutt, [name]  ([id number]) -------------------------------------------------------------------------------- Multi Wound Chart Details Patient Name: [name] Date of Service: 01/31/2018 8:00 AM Medical Record Number: [id number] Patient Account Number: [id number] Date of Birth/Sex: 07/19/1950 (67 y.o. M) Treating RN: Montey Hora Primary Care Para Cossey: Royetta Crochet Other Clinician: Referring Jadin Kagel: Royetta Crochet Treating Lenny Fiumara/Extender: STONE III, HOYT Weeks in Treatment: 14 Vital Signs Height(in): 74 Pulse(bpm): 78 Weight(lbs): 236 Blood Pressure(mmHg): 145/86 Body Mass Index(BMI): 30 Temperature(F): 97.6 Respiratory Rate 18 (breaths/min): Photos: [8:No Photos] [N/A:N/A] Wound Location: [8:Right Lower Leg - Medial, Proximal] [N/A:N/A] Wounding Event: [8:Gradually Appeared] [N/A:N/A] Primary Etiology: [8:Diabetic Wound/Ulcer of the Lower Extremity] [  N/A:N/A] Secondary Etiology: [8:Lymphedema] [N/A:N/A] Comorbid History: [8:Chronic Obstructive Pulmonary Disease (COPD), Arrhythmia, Deep Vein Thrombosis, Hypertension, Type II Diabetes, Gout, Neuropathy] [N/A:N/A] Date Acquired: [8:10/17/2017] [N/A:N/A] Weeks of Treatment: [8:14] [N/A:N/A] Wound Status: [8:Open] [N/A:N/A] Measurements L x W x D [8:3.2x2.5x0.3] [N/A:N/A] (cm) Area (cm) : [8:6.283] [N/A:N/A] Volume (cm) : [8:1.885] [N/A:N/A] % Reduction in Area: [8:-3707.90%] [N/A:N/A] % Reduction in Volume: [8:-11681.20%] [N/A:N/A] Classification: [8:Grade 2] [N/A:N/A] Exudate Amount: [8:Large] [N/A:N/A] Exudate Type: [8:Purulent] [N/A:N/A] Exudate Color: [8:yellow, brown, green] [N/A:N/A] Foul Odor After Cleansing: [8:Yes] [N/A:N/A] Odor Anticipated Due to [8:No] [N/A:N/A] Product Use: Wound Margin: [8:Flat and Intact] [N/A:N/A] Granulation Amount: [8:Large (67-100%)] [N/A:N/A] Granulation Quality: [8:Red, Pink] [N/A:N/A] Necrotic Amount: [8:Small (1-33%)] [N/A:N/A] Necrotic Tissue: [8:Eschar, Adherent Slough]  [N/A:N/A] Exposed Structures: [8:Fat Layer (Subcutaneous Tissue) Exposed: Yes Fascia: No] [N/A:N/A] Tendon: No Muscle: No Joint: No Bone: No Epithelialization: Small (1-33%) N/A N/A Periwound Skin Texture: Scarring: Yes N/A N/A Excoriation: No Induration: No Callus: No Crepitus: No Rash: No Periwound Skin Moisture: Maceration: Yes N/A N/A Dry/Scaly: No Periwound Skin Color: Atrophie Blanche: No N/A N/A Cyanosis: No Ecchymosis: No Erythema: No Hemosiderin Staining: No Mottled: No Pallor: No Rubor: No Temperature: No Abnormality N/A N/A Tenderness on Palpation: Yes N/A N/A Wound Preparation: Ulcer Cleansing: N/A N/A Rinsed/Irrigated with Saline, Other: soap and water Topical Anesthetic Applied: None Treatment Notes Electronic Signature(s) Signed: 01/31/2018 5:04:25 PM By: Montey Hora Entered By: Montey Hora on 01/31/2018 08:28:04 Weckerly, [name] ([id number]) -------------------------------------------------------------------------------- Multi-Disciplinary Care Plan Details Patient Name: [name] Date of Service: 01/31/2018 8:00 AM Medical Record Number: [id number] Patient Account Number: [id number] Date of Birth/Sex: Mar 07, 1951 (67 y.o. M) Treating RN: Montey Hora Primary Care Curtez Brallier: Royetta Crochet Other Clinician: Referring Sheila Gervasi: Royetta Crochet Treating Fredonia Casalino/Extender: Melburn Hake, HOYT Weeks in Treatment: 14 Active Inactive ` Abuse / Safety / Falls / Self Care Management Nursing Diagnoses: Potential for falls Goals: Patient will not experience any injury related to falls Date Initiated: 10/25/2017 Target Resolution Date: 02/18/2018 Goal Status: Active Interventions: Assess Activities of Daily Living upon admission and as needed Assess fall risk on admission and as needed Assess: immobility, friction, shearing, incontinence upon admission and as needed Assess impairment of mobility on admission and as needed per policy Assess  personal safety and home safety (as indicated) on admission and as needed Assess self care needs on admission and as needed Notes: ` Nutrition Nursing Diagnoses: Imbalanced nutrition Impaired glucose control: actual or potential Potential for alteratiion in Nutrition/Potential for imbalanced nutrition Goals: Patient/caregiver agrees to and verbalizes understanding of need to use nutritional supplements and/or vitamins as prescribed Date Initiated: 10/25/2017 Target Resolution Date: 02/18/2018 Goal Status: Active Patient/caregiver will maintain therapeutic glucose control Date Initiated: 10/25/2017 Target Resolution Date: 02/18/2018 Goal Status: Active Interventions: Assess patient nutrition upon admission and as needed per policy Provide education on elevated blood sugars and impact on wound healing Provide education on nutrition RAUDEL, BAZEN ([id number]) Notes: ` Orientation to the Wound Care Program Nursing Diagnoses: Knowledge deficit related to the wound healing center program Goals: Patient/caregiver will verbalize understanding of the [facility] Date Initiated: 10/25/2017 Target Resolution Date: 11/19/2017 Goal Status: Active Interventions: Provide education on orientation to the wound center Notes: ` Wound/Skin Impairment Nursing Diagnoses: Impaired tissue integrity Knowledge deficit related to ulceration/compromised skin integrity Goals: Ulcer/skin breakdown will have a volume reduction of 80% by week 12 Date Initiated: 10/25/2017 Target Resolution Date: 02/11/2018 Goal Status: Active Interventions: Assess patient/caregiver ability to perform ulcer/skin care regimen upon admission and as needed Assess ulceration(s)  every visit Notes: Electronic Signature(s) Signed: 01/31/2018 5:04:25 PM By: Montey Hora Entered By: Montey Hora on 01/31/2018 08:27:46 Disney, [name]  ([id number]) -------------------------------------------------------------------------------- Pain Assessment Details Patient Name: [name] Date of Service: 01/31/2018 8:00 AM Medical Record Number: [id number] Patient Account Number: [id number] Date of Birth/Sex: January 13, 1951 (67 y.o. M) Treating RN: Cornell Barman Primary Care Haneef Hallquist: Royetta Crochet Other Clinician: Referring Feather Berrie: Royetta Crochet Treating Sindi Beckworth/Extender: Melburn Hake, HOYT Weeks in Treatment: 14 Active Problems Location of Pain Severity and Description of Pain Patient Has Paino Yes Site Locations Rate the pain. Current Pain Level: 5 Character of Pain Describe the Pain: Shooting, Throbbing Pain Management and Medication Current Pain Management: Electronic Signature(s) Signed: 01/31/2018 8:42:56 AM By: Gretta Cool, [name], RN, CWS, [name], [name] Entered By: Gretta Cool, [name], RN, CWS, Kim on 01/31/2018 08:08:01 [name] ([id number]) -------------------------------------------------------------------------------- Patient/Caregiver Education Details Patient Name: [name] Date of Service: 01/31/2018 8:00 AM Medical Record Number: [id number] Patient Account Number: [id number] Date of Birth/Gender: 1950-09-24 (67 y.o. M) Treating RN: Montey Hora Primary Care Physician: Royetta Crochet Other Clinician: Referring Physician: Royetta Crochet Treating Physician/Extender: Sharalyn Ink in Treatment: 14 Education Assessment Education Provided To: Patient Education Topics Provided Venous: Handouts: Other: leg elevation Methods: Explain/Verbal Responses: State content correctly Electronic Signature(s) Signed: 01/31/2018 5:04:25 PM By: Montey Hora Entered By: Montey Hora on 01/31/2018 09:08:37 Sadler, [name] ([id number]) -------------------------------------------------------------------------------- Wound Assessment Details Patient Name: [name] Date of Service: 01/31/2018  8:00 AM Medical Record Number: [id number] Patient Account Number: [id number] Date of Birth/Sex: 20-Sep-1950 (67 y.o. M) Treating RN: Cornell Barman Primary Care Imara Standiford: Royetta Crochet Other Clinician: Referring Yvonnie Schinke: Royetta Crochet Treating Janyia Guion/Extender: STONE III, HOYT Weeks in Treatment: 14 Wound Status Wound Number: 8 Primary Diabetic Wound/Ulcer of the Lower Extremity Etiology: Wound Location: Right Lower Leg - Medial, Proximal Secondary Lymphedema Wounding Event: Gradually Appeared Etiology: Date Acquired: 10/17/2017 Wound Open Weeks Of Treatment: 14 Status: Clustered Wound: No Comorbid Chronic Obstructive Pulmonary Disease History: (COPD), Arrhythmia, Deep Vein Thrombosis, Hypertension, Type II Diabetes, Gout, Neuropathy Photos Photo Uploaded By: Gretta Cool, [name], RN, CWS, Kim on 01/31/2018 18:08:51 Wound Measurements Length: (cm) 3.2 Width: (cm) 2.5 Depth: (cm) 0.3 Area: (cm) 6.283 Volume: (cm) 1.885 % Reduction in Area: -3707.9% % Reduction in Volume: -11681.2% Epithelialization: Small (1-33%) Tunneling: No Undermining: No Wound Description Classification: Grade 2 Foul Odo Wound Margin: Flat and Intact Due to P Exudate Amount: Large Slough/F Exudate Type: Purulent Exudate Color: yellow, brown, green r After Cleansing: Yes roduct Use: No ibrino Yes Wound Bed Granulation Amount: Large (67-100%) Exposed Structure Granulation Quality: Red, Pink Fascia Exposed: No Necrotic Amount: Small (1-33%) Fat Layer (Subcutaneous Tissue) Exposed: Yes Necrotic Quality: Eschar, Adherent Slough Tendon Exposed: No Muscle Exposed: No Joint Exposed: No Kronk, Darryll ([id number]) Bone Exposed: No Periwound Skin Texture Texture Color No Abnormalities Noted: No No Abnormalities Noted: No Callus: No Atrophie Blanche: No Crepitus: No Cyanosis: No Excoriation: No Ecchymosis: No Induration: No Erythema: No Rash: No Hemosiderin Staining: No Scarring:  Yes Mottled: No Pallor: No Moisture Rubor: No No Abnormalities Noted: No Dry / Scaly: No Temperature / Pain Maceration: Yes Temperature: No Abnormality Tenderness on Palpation: Yes Wound Preparation Ulcer Cleansing: Rinsed/Irrigated with Saline, Other: soap and water, Topical Anesthetic Applied: None Treatment Notes Wound #8 (Right, Proximal, Medial Lower Leg) 1. Cleansed with: Cleanse wound with antibacterial soap and water 2. Anesthetic Topical Lidocaine 4% cream to wound bed prior to debridement 3. Peri-wound Care: Moisturizing lotion 4. Dressing Applied: Hydrafera Blue Other dressing (specify in notes) 5. Secondary  Dressing Applied ABD Pad 7. Secured with 3 Layer Compression System - Right Lower Extremity Notes xtrasorb, unna to Engineer, production) Signed: 01/31/2018 8:42:56 AM By: Gretta Cool, [name], RN, CWS, [name], [name] Entered By: Gretta Cool, [name], RN, CWS, Kim on 01/31/2018 08:14:15 Signer, [name] ([id number]) -------------------------------------------------------------------------------- Vitals Details Patient Name: [name] Date of Service: 01/31/2018 8:00 AM Medical Record Number: [id number] Patient Account Number: [id number] Date of Birth/Sex: 10-Nov-1950 (67 y.o. M) Treating RN: Cornell Barman Primary Care Skyy Mcknight: Royetta Crochet Other Clinician: Referring Burns Timson: Royetta Crochet Treating Anyeli Hockenbury/Extender: Melburn Hake, HOYT Weeks in Treatment: 14 Vital Signs Time Taken: 08:08 Temperature (F): 97.6 Height (in): 74 Pulse (bpm): 78 Weight (lbs): 236 Respiratory Rate (breaths/min): 18 Body Mass Index (BMI): 30.3 Blood Pressure (mmHg): 145/86 Reference Range: 80 - 120 mg / dl Electronic Signature(s) Signed: 01/31/2018 8:42:56 AM By: Gretta Cool, [name], RN, CWS, [name], [name] Entered By: Gretta Cool, [name], RN, CWS, Kim on 01/31/2018 08:10:10

## 2018-02-02 NOTE — Progress Notes (Signed)
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 dressing changes without complication. With that being said he does have some epithelialization growing into the wound bed and it appears this wound may  be trying to healed in a depressed fashion. To be honest that is not the worst case scenario and I think this may definitely do fine even if it heals in that way. There was no requirement for debridement today as patient appears to be doing very well in regard to his ulcer. His swelling was a little bit more severe than last week but nothing too significant. 07/05/17 on evaluation today patient appears to be doing rather well in regard to his right medial lower extremity ulcer. The wound does appear to be healing although it is healing and somewhat of a cratered fashion the good news is he has excellent epithelium noted. Fortunately there does not appear to be any evidence of infection at this point he is tolerating the dressings as well is the wrap well. 07/12/17 on evaluation today patient appears to be doing very well in regard to his left medial lower extremity ulcer. He has been tolerating the dressing changes without complication. In fact compared to last week this wound which was somewhat concave has fielding dramatically and looks to be doing excellent. I'm extremely happy with the progress. He still has pain rated to be a 4-5/10 but the wound looks great. 07/20/17; the patient's right medial lower extremity ulcer is closed. He has good edema control. Unfortunately he does not really have adequate compression stockings. 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 He has been treated with compression stockings for some time now in fact we have notes having treated him even as far back as 2016 here in our clinic. Fortunately he has excellent blood flow and tends to heal well.  Unfortunately despite wearing his compression stockings, keeping his legs elevated, being on fluid pills (Lasix) and doing everything he can to try to help with fluid he still continues to have issues with this reopening. I think that he may be a good candidate for compression/lymphedema pumps. We have never attempted to get these for him previously. No fevers, chills, nausea, or vomiting noted at this time. Currently patient states his ulcer does hurt although not as bad as some in the past it's also not as large as it has been in the past when it reopened he talked is somewhat early. Obviously this is good news. [name], [name] ([id number]) 12/13/17 on evaluation today patient appears to actually be doing very well in regard to his right medial ankle ulcer compared to last week's evaluation. I think having him on the correct antibiotics has made all the difference in the world. With that being said he's not having discomfort like  he was previous and in general I feel like he has made excellent strides towards healing in the past week. No fevers, chills, nausea, or vomiting noted at this time. 01/10/18 on evaluation today patient appears to be doing a little worse compared to last week's evaluation. I think that though the Iodoflex has help with cleaning up the surface of the wound subsequently he has having a lot of drainage which I think is causing some issues in that regard. At this point I think we may want to switch the dressings. 01/17/18 on evaluation today patient appears to be doing rather well in regard to his right medial malleolus ulcer. The good news is I do feel like he showing some signs of improvement which is excellent. I feel like that the [facility] Dressing been better for him than the Iodoflex that we will previously utilizing. He is having much [name] in the way of discomfort which is good news. Overall very pleased in that regard. Nonetheless the appearance of the wound is  improved today as well and that is excellent news. He still has pain but not seemingly as much as previous and I don't feel like there's much maceration either. 01/23/18 on evaluation today patient appears in our clinic a day early for his appointment due to the fact that he was having discomfort and issues with his wrap. Upon inspection of the wound actually appears to be doing fairly well which is good news. With that being said he is having a lot of discomfort at this point unfortunately. I think that this is mainly due to the fact that he is having such drainage as the wound bed itself seems to be doing well. I do believe the [facility] Dressing well is helpful for him which is good news. Nonetheless again I think he may need more frequent dressing changes. 01/31/18 on evaluation today patient actually appears to be doing very well in regard to his right medial ankle ulcer. This is significantly better than what was even noted last week I do think that the extra dressing change with the nurse visit toward the end of the week made a significant improvement for him. Overall I'm very pleased with how things appear today. Patient History Information obtained from Patient. Family History Heart Disease - Mother,Father, Hypertension - Mother,Father, Stroke - Father, No family history of Cancer, Diabetes, Hereditary Spherocytosis, Thyroid Problems, Tuberculosis. Social History Current every day smoker, Marital Status - Single, Alcohol Use - Never, Drug Use - No History, Caffeine Use - Daily. Medical History Hospitalization/Surgery History - 05/17/2012, [facility], CVA. - 03/17/2017, [facility], Pneumonia. Medical And Surgical History Notes Ear/Nose/Mouth/Throat difficulty speaking Respiratory Hospitalized for Pneumonia 02/2017 Neurologic CVA in 2014 Review of Systems (ROS) Constitutional Symptoms (General Health) Denies complaints or symptoms of Fever, Chills. Respiratory The patient has no complaints or  symptoms. Cardiovascular Complains or has symptoms of LE edema. Psychiatric The patient has no complaints or symptoms. [name], [name] ([id number]) Objective Constitutional Well-nourished and well-hydrated in no acute distress. Vitals Time Taken: 8:08 AM, Height: 74 in, Weight: 236 lbs, BMI: 30.3, Temperature: 97.6 F, Pulse: 78 bpm, Respiratory Rate: 18 breaths/min, Blood Pressure: 145/86 mmHg. Respiratory normal breathing without difficulty. Psychiatric this patient is able to make decisions and demonstrates good insight into disease process. Alert and Oriented x 3. pleasant and cooperative. General Notes: Patient's wound bed actually did have some Slough noted on the surface of the wound although he does seem to be making good progress and there was  a lot of new epithelialization noted as well today. Overall I'm very pleased with the progression from last time to this time. I did perform sharp debridement today to clean away necrotic tissue from the surface of the wound the patient tolerated this without complication. Integumentary (Hair, Skin) Wound #8 status is Open. Original cause of wound was Gradually Appeared. The wound is located on the Right,Proximal,Medial Lower Leg. The wound measures 3.2cm length x 2.5cm width x 0.3cm depth; 6.283cm^2 area and 1.885cm^3 volume. There is Fat Layer (Subcutaneous Tissue) Exposed exposed. There is no tunneling or undermining noted. There is a large amount of purulent drainage noted. Foul odor after cleansing was noted. The wound margin is flat and intact. There is large (67-100%) red, pink granulation within the wound bed. There is a small (1-33%) amount of necrotic tissue within the wound bed including Eschar and Adherent Slough. The periwound skin appearance exhibited: Scarring, Maceration. The periwound skin appearance did not exhibit: Callus, Crepitus, Excoriation, Induration, Rash, Dry/Scaly, Atrophie Blanche, Cyanosis, Ecchymosis,  Hemosiderin Staining, Mottled, Pallor, Rubor, Erythema. Periwound temperature was noted as No Abnormality. The periwound has tenderness on palpation. 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 There was a Excisional Skin/Subcutaneous Tissue Debridement with a total area of 8 sq cm performed by STONE III, Florette Thai E., PA-C. With the following instrument(s): Curette to remove Viable and Non-Viable tissue/material. Material removed includes Subcutaneous Tissue and Slough and after achieving pain control using Lidocaine 4% Topical Solution. No specimens were taken. A time out was conducted at 08:28, prior to the start of the procedure. A Minimum amount of bleeding was controlled with Pressure. The procedure was tolerated well with a pain level of 0 throughout and a pain level of 0 following the procedure. Post Debridement Measurements: 3.2cm length x 2.5cm width x 0.4cm depth; 2.513cm^3 volume. Character of Wound/Ulcer Post Debridement is improved. Severity of Tissue Post Debridement is: Fat layer exposed. Post procedure Diagnosis Wound #8: Same as Pre-Procedure Plan Wound Cleansing: Wound #8 Right,Proximal,Medial Lower Leg: Clean wound with Normal Saline. Cleanse wound with mild soap and water Anesthetic (add to Medication List): Wound #8 Right,Proximal,Medial Lower Leg: Topical Lidocaine 4% cream applied to wound bed prior to debridement (In Clinic Only). Skin Barriers/Peri-Wound Care: Wound #8 Right,Proximal,Medial Lower Leg: Moisturizing lotion - not  on wound area Primary Wound Dressing: Wound #8 Right,Proximal,Medial Lower Leg: Hydrafera Blue Ready Transfer Secondary Dressing: Wound #8 Right,Proximal,Medial Lower Leg: ABD pad XtraSorb Dressing Change Frequency: Wound #8 Right,Proximal,Medial Lower Leg: Change dressing every week Follow-up Appointments: Wound #8 Right,Proximal,Medial Lower Leg: Return Appointment in 1 week. Nurse Visit as needed Edema Control: Wound #8 Right,Proximal,Medial Lower Leg: 3 Layer Compression System - Right Lower Extremity - unna to anchor Other: - order bilateral compression pumps Additional Orders / Instructions: Wound #8 Right,Proximal,Medial Lower Leg: Increase protein intake. [name], [name] ([id number]) I'm gonna suggest currently that we continue with the above wound care measures for the next week. Patients in agreement with plan. We will see him back for a nurse visit later this week. Please see above for specific wound care orders. We will see patient for re-evaluation in 1 week(s) here  in the clinic. If anything worsens or changes patient will contact our office for additional recommendations. Electronic Signature(s) Signed: 01/31/2018 4:58:18 PM By: Worthy Keeler PA-C Entered By: Worthy Keeler on 01/31/2018 08:35:50 [name], [name] ([id number]) -------------------------------------------------------------------------------- ROS/PFSH Details Patient Name: [name] Date of Service: 01/31/2018 8:00 AM Medical Record Number: [id number] Patient Account Number: [id number] Date of Birth/Sex: Jul 08, 1950 (67 y.o. M) Treating RN: Montey Hora Primary Care Provider: Royetta Crochet Other Clinician: Referring Provider: Royetta Crochet Treating Provider/Extender: Melburn Hake, Lynzie Cliburn Weeks in Treatment: 14 Information Obtained From Patient Wound History Do you currently have one or more open woundso Yes How many open wounds do you currently haveo 1 Approximately how long have  you had your woundso 1 week How have you been treating your wound(s) until nowo lotion Has your wound(s) ever healed and then re-openedo Yes Have you had any lab work done in the past montho No Have you tested positive for an antibiotic resistant organism (MRSA, VRE)o No Have you tested positive for osteomyelitis (bone infection)o No Have you had any tests for circulation on your legso Yes Who ordered the testo [facility] Where was the test doneo [facility] Constitutional Symptoms (General Health) Complaints and Symptoms: Negative for: Fever; Chills Cardiovascular Complaints and Symptoms: Positive for: LE edema Medical History: Positive for: Arrhythmia - a fib; Deep Vein Thrombosis; Hypertension Negative for: Angina; Congestive Heart Failure; Coronary Artery Disease; Hypotension; Myocardial Infarction; Peripheral Arterial Disease; Peripheral Venous Disease; Phlebitis; Vasculitis Eyes Medical History: Negative for: Cataracts; Glaucoma; Optic Neuritis Ear/Nose/Mouth/Throat Medical History: Negative for: Chronic sinus problems/congestion; Middle ear problems Past Medical History Notes: difficulty speaking Hematologic/Lymphatic Medical History: Negative for: Anemia; Hemophilia; Human Immunodeficiency Virus; Lymphedema; Sickle Cell Disease Respiratory Minihan, Tryson ([id number]) Complaints and Symptoms: No Complaints or Symptoms Medical History: Positive for: Chronic Obstructive Pulmonary Disease (COPD) Negative for: Aspiration; Asthma; Pneumothorax; Sleep Apnea; Tuberculosis Past Medical History Notes: Hospitalized for Pneumonia 02/2017 Gastrointestinal Medical History: Negative for: Cirrhosis ; Colitis; Crohnos; Hepatitis A; Hepatitis B; Hepatitis C Endocrine Medical History: Positive for: Type II Diabetes Negative for: Type I Diabetes Time with diabetes: since 2017 Treated with: Diet Blood sugar tested every day: No Genitourinary Medical History: Negative for: End Stage  Renal Disease Immunological Medical History: Negative for: Lupus Erythematosus; Raynaudos; Scleroderma Integumentary (Skin) Medical History: Negative for: History of Burn; History of pressure wounds Musculoskeletal Medical History: Positive for: Gout Negative for: Rheumatoid Arthritis; Osteoarthritis; Osteomyelitis Neurologic Medical History: Positive for: Neuropathy Negative for: Dementia; Quadriplegia; Paraplegia; Seizure Disorder Past Medical History Notes: CVA in 2014 Oncologic Medical History: Negative for: Received Chemotherapy; Received Radiation Psychiatric [name], [name] ([id number]) Complaints and Symptoms: No Complaints or Symptoms Medical History: Negative for: Anorexia/bulimia; Confinement Anxiety Immunizations Pneumococcal Vaccine: Received Pneumococcal Vaccination: Yes Immunization Notes: up to date Implantable Devices Hospitalization / Surgery History Name of Hospital Purpose of Hospitalization/Surgery Date [facility] CVA 05/17/2012 [place] Pneumonia 03/17/2017 Family and Social History Cancer: No; Diabetes: No; Heart Disease: Yes - Mother,Father; Hereditary Spherocytosis: No; Hypertension: Yes - Mother,Father; Stroke: Yes - Father; Thyroid Problems: No; Tuberculosis: No; Current every day smoker; Marital Status - Single; Alcohol Use: Never; Drug Use: No History; Caffeine Use: Daily; Financial Concerns: No; Food, Clothing or Shelter Needs: No; Support System Lacking: No; Transportation Concerns: No; Advanced Directives: No; Patient does not want information on Advanced Directives Physician Affirmation I have reviewed and agree with the above information. Electronic Signature(s) Signed: 01/31/2018 4:58:18 PM By: Worthy Keeler PA-C Signed: 01/31/2018 5:04:25 PM By: Montey Hora Entered By: Worthy Keeler  on 01/31/2018 08:35:16 Degrave, [name] ([id number]) -------------------------------------------------------------------------------- SuperBill  Details Patient Name: [name] Date of Service: 01/31/2018 Medical Record Number: [id number] Patient Account Number: [id number] Date of Birth/Sex: 12/30/1950 (67 y.o. M) Treating RN: Montey Hora Primary Care Provider: Royetta Crochet Other Clinician: Referring Provider: Royetta Crochet Treating Provider/Extender: Melburn Hake, Maxamilian Amadon Weeks in Treatment: 14 Diagnosis Coding ICD-10 Codes Code Description I87.2 Venous insufficiency (chronic) (peripheral) I89.0 Lymphedema, not elsewhere classified E11.622 Type 2 diabetes mellitus with other skin ulcer L97.812 Non-pressure chronic ulcer of other part of right lower leg with fat layer exposed Facility Procedures CPT4 Code Description: 96728979 11042 - DEB SUBQ TISSUE 20 SQ CM/< ICD-10 Diagnosis Description N50.413 Non-pressure chronic ulcer of other part of right lower leg wit Modifier: h fat layer expo Quantity: 1 sed Physician Procedures CPT4 Code Description: [id number] 11042 - WC PHYS SUBQ TISS 20 SQ CM ICD-10 Diagnosis Description P39.688 Non-pressure chronic ulcer of other part of right lower leg wit Modifier: h fat layer expo Quantity: 1 sed Electronic Signature(s) Signed: 01/31/2018 4:58:18 PM By: Worthy Keeler PA-C Entered By: Worthy Keeler on 01/31/2018 08:36:11

## 2018-02-03 DIAGNOSIS — E11622 Type 2 diabetes mellitus with other skin ulcer: Secondary | ICD-10-CM | POA: Diagnosis not present

## 2018-02-04 ENCOUNTER — Emergency Department: Payer: Medicare Other

## 2018-02-04 ENCOUNTER — Other Ambulatory Visit: Payer: Self-pay

## 2018-02-04 ENCOUNTER — Emergency Department
Admission: EM | Admit: 2018-02-04 | Discharge: 2018-02-04 | Disposition: A | Discharge status: Home / Self Care | Payer: Medicare Other | Attending: Student in an Organized Health Care Education/Training Program | Admitting: Student in an Organized Health Care Education/Training Program

## 2018-02-04 DIAGNOSIS — Z7901 Long term (current) use of anticoagulants: Secondary | ICD-10-CM | POA: Insufficient documentation

## 2018-02-04 DIAGNOSIS — Z79899 Other long term (current) drug therapy: Secondary | ICD-10-CM | POA: Insufficient documentation

## 2018-02-04 DIAGNOSIS — I509 Heart failure, unspecified: Secondary | ICD-10-CM | POA: Diagnosis not present

## 2018-02-04 DIAGNOSIS — R0601 Orthopnea: Secondary | ICD-10-CM

## 2018-02-04 DIAGNOSIS — Z7984 Long term (current) use of oral hypoglycemic drugs: Secondary | ICD-10-CM | POA: Diagnosis not present

## 2018-02-04 DIAGNOSIS — R0602 Shortness of breath: Secondary | ICD-10-CM | POA: Diagnosis present

## 2018-02-04 DIAGNOSIS — I110 Hypertensive heart disease with heart failure: Secondary | ICD-10-CM | POA: Diagnosis not present

## 2018-02-04 DIAGNOSIS — F17210 Nicotine dependence, cigarettes, uncomplicated: Secondary | ICD-10-CM | POA: Diagnosis not present

## 2018-02-04 DIAGNOSIS — E119 Type 2 diabetes mellitus without complications: Secondary | ICD-10-CM | POA: Diagnosis not present

## 2018-02-04 DIAGNOSIS — Z7982 Long term (current) use of aspirin: Secondary | ICD-10-CM | POA: Insufficient documentation

## 2018-02-04 LAB — CBC WITH DIFFERENTIAL/PLATELET
Basophils Absolute: 0 10*3/uL (ref 0–0.1)
Basophils Relative: 1 %
EOS ABS: 0.1 10*3/uL (ref 0–0.7)
EOS PCT: 3 %
HCT: 34.5 % — ABNORMAL LOW (ref 40.0–52.0)
Hemoglobin: 11.1 g/dL — ABNORMAL LOW (ref 13.0–18.0)
LYMPHS ABS: 0.9 10*3/uL — AB (ref 1.0–3.6)
Lymphocytes Relative: 27 %
MCH: 24.6 pg — AB (ref 26.0–34.0)
MCHC: 32.2 g/dL (ref 32.0–36.0)
MCV: 76.5 fL — AB (ref 80.0–100.0)
Monocytes Absolute: 0.3 10*3/uL (ref 0.2–1.0)
Monocytes Relative: 7 %
Neutro Abs: 2.2 10*3/uL (ref 1.4–6.5)
Neutrophils Relative %: 62 %
PLATELETS: 139 10*3/uL — AB (ref 150–440)
RBC: 4.51 MIL/uL (ref 4.40–5.90)
RDW: 18 % — ABNORMAL HIGH (ref 11.5–14.5)
WBC: 3.5 10*3/uL — ABNORMAL LOW (ref 3.8–10.6)

## 2018-02-04 LAB — COMPREHENSIVE METABOLIC PANEL
ALBUMIN: 3.4 g/dL — AB (ref 3.5–5.0)
ALT: 26 U/L (ref 0–44)
ANION GAP: 9 (ref 5–15)
AST: 35 U/L (ref 15–41)
Alkaline Phosphatase: 80 U/L (ref 38–126)
BUN: 14 mg/dL (ref 8–23)
CALCIUM: 8.6 mg/dL — AB (ref 8.9–10.3)
CO2: 26 mmol/L (ref 22–32)
CREATININE: 0.89 mg/dL (ref 0.61–1.24)
Chloride: 106 mmol/L (ref 98–111)
GFR calc Af Amer: >60 mL/min (ref >60)
Glucose, Bld: 101 mg/dL — ABNORMAL HIGH (ref 70–99)
POTASSIUM: 3.3 mmol/L — AB (ref 3.5–5.1)
Sodium: 141 mmol/L (ref 135–145)
TOTAL PROTEIN: 6.9 g/dL (ref 6.5–8.1)
Total Bilirubin: 1 mg/dL (ref 0.3–1.2)

## 2018-02-04 LAB — PROTIME-INR
INR: 1.36
PROTHROMBIN TIME: 16.7 s — AB (ref 11.4–15.2)

## 2018-02-04 LAB — TROPONIN I: Troponin I: 0.03 ng/mL (ref <0.03)

## 2018-02-04 LAB — URINE DRUG SCREEN, QUALITATIVE (ARMC ONLY)
Amphetamines, Ur Screen: NOT DETECTED
Barbiturates, Ur Screen: NOT DETECTED
Benzodiazepine, Ur Scrn: NOT DETECTED
CANNABINOID 50 NG, UR ~~LOC~~: NOT DETECTED
COCAINE METABOLITE, UR ~~LOC~~: NOT DETECTED
MDMA (Ecstasy)Ur Screen: NOT DETECTED
Methadone Scn, Ur: NOT DETECTED
Opiate, Ur Screen: NOT DETECTED
PHENCYCLIDINE (PCP) UR S: NOT DETECTED
TRICYCLIC, UR SCREEN: NOT DETECTED

## 2018-02-04 LAB — BRAIN NATRIURETIC PEPTIDE: B Natriuretic Peptide: 482 pg/mL — ABNORMAL HIGH (ref 0.0–100.0)

## 2018-02-04 LAB — ETHANOL: Alcohol, Ethyl (B): <10 mg/dL (ref <10)

## 2018-02-04 MED ORDER — AMLODIPINE BESYLATE 5 MG PO TABS
5.0000 mg | ORAL_TABLET | Freq: Once | ORAL | Status: AC
  Administered 2018-02-04: 5 mg via ORAL
  Filled 2018-02-04: qty 1

## 2018-02-04 MED ORDER — CLONIDINE HCL 0.1 MG PO TABS
0.2000 mg | ORAL_TABLET | Freq: Once | ORAL | Status: AC
  Administered 2018-02-04: 0.2 mg via ORAL
  Filled 2018-02-04: qty 2

## 2018-02-04 MED ORDER — POTASSIUM CHLORIDE CRYS ER 20 MEQ PO TBCR
30.0000 meq | EXTENDED_RELEASE_TABLET | Freq: Once | ORAL | Status: AC
Start: 2018-02-04 — End: 2018-02-04
  Administered 2018-02-04: 30 meq via ORAL
  Filled 2018-02-04: qty 2

## 2018-02-04 MED ORDER — FUROSEMIDE 10 MG/ML IJ SOLN
60.0000 mg | Freq: Once | INTRAMUSCULAR | Status: AC
  Administered 2018-02-04: 60 mg via INTRAVENOUS
  Filled 2018-02-04: qty 8

## 2018-02-04 NOTE — ED Provider Notes (Signed)
[facility] Emergency Department Provider Note    First MD Initiated Contact with Patient 02/04/18 [phone]     (approximate)  I have reviewed the triage vital signs and the nursing notes.   HISTORY  Chief Complaint Shortness of Breath    HPI [name] is a 67 y.o. male history of A. fib on anticoagulation with Eliquis as well as DVT polysubstance abuse who presents the ER for evaluation of 3 days of worsening additional dyspnea orthopnea and chest tightness.  Denies any fevers.  Denies any pain when taking deep breath.  No productive cough.  Denies any nausea or vomiting.  Has had worsening leg swelling.  States that he is still having response to Lasix which he takes at home.    NM 08/22/17 Borderline myocardial perfusion scan no evidence of   stress-induced myocardial ischemia there is left ventricular enlargement   ejection fraction between 40 and 45% with no clear ischemia conclusion   intermediate scan   Past Medical History:  Diagnosis Date  . Atrial fibrillation ([place])   . Cocaine abuse ([place])    + UDS on admission  . Gout current and history of  . History of deep vein thrombosis 2006  . History of tobacco abuse    has smoked for 50 years  . Hypertension   . Pulmonary embolism ([place])   . Stroke [facility])    Family History  Problem Relation Age of Onset  . Aneurysm Mother   . Diabetes Father    Past Surgical History:  Procedure Laterality Date  . VASCULAR SURGERY     Stent in leg   Patient Active Problem List   Diagnosis Date Noted  . Arthritis 05/12/2017  . Varicose veins of leg with swelling, bilateral 01/18/2017  . Facial twitching   . Cerebral infarction ([place]) 10/03/2016  . 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 Pneumonia 10/12/2014  . Osteoarthritis of right knee 02/11/2014  . Pain and swelling of knee 01/08/2014  . Pain in extremity 01/08/2014  . Weakness due to cerebrovascular  accident 01/08/2014  . Pulmonary embolism ([place]) 06/19/2012  . Gout 09/22/2011  . 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 CVA (cerebral infarction) 04/26/2011  . Diabetes mellitus ([place]) 04/26/2011  . 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 Anemia 04/26/2011  . DVT (deep venous thrombosis) ([place]) 04/26/2011  . Essential (primary) hypertension 04/26/2011      Prior to Admission medications   Medication Sig Start Date End Date Taking? Authorizing Provider  apixaban (ELIQUIS) 5 MG TABS tablet 10 mg twice daily for 7 days followed by 5 mg twice daily. 01/16/18   Earleen Newport, MD  aspirin 81 MG tablet Take 81 mg by mouth daily.    [provider]  carisoprodol (SOMA) 350 MG tablet Take 1 tablet (350 mg total) by mouth 3 (three) times daily as needed. 11/24/17   Orbie Pyo, MD  colchicine 0.6 MG tablet Take 0.6 mg by mouth daily.     [provider]  colchicine 0.6 MG tablet Take 2 tablets (1.2mg ) by mouth at first sign of gout flare followed by 1 tablet (0.6mg ) after 1 hour. (Max 1.8mg  within 1 hour).  Cut back if diarrhea develops. 11/14/12   [provider]  Ferrous Sulfate (IRON) 28 MG TABS Take 1 tablet by mouth daily.    [provider]  furosemide (LASIX) 40 MG tablet Take 40 mg by mouth daily.  08/05/15   [provider]  guaiFENesin-dextromethorphan (ROBITUSSIN DM) 100-10 MG/5ML syrup Take 5 mLs by mouth every 4 (four) hours as needed for cough. Patient not taking: Reported on 07/20/2017 03/04/17   Fritzi Mandes, MD  HYDROcodone-acetaminophen [facility]) 5-325 MG tablet Take 1 tablet by mouth every 4 (four) hours as needed for moderate pain. Patient not taking: Reported on 10/03/2016 07/18/16   Merlyn Lot, MD  ibuprofen (ADVIL,MOTRIN) 200 MG tablet Take 600 mg by mouth daily.     [provider]  lactulose (CHRONULAC) 10 GM/15ML solution Take 30 mLs (20 g total) by mouth daily as needed for mild  constipation. Patient not taking: Reported on 10/03/2016 05/11/15   Paulette Blanch, MD  metFORMIN (GLUCOPHAGE) 500 MG tablet Take 500 mg by mouth 2 (two) times daily with a meal.  04/12/17   [provider]  metoprolol tartrate (LOPRESSOR) 25 MG tablet Take 25 mg by mouth 2 (two) times daily.    [provider]  nutrition supplement, JUVEN, (JUVEN) PACK Take 1 packet by mouth 2 (two) times daily between meals. Patient not taking: Reported on 07/20/2017 10/05/16   Demetrios Loll, MD  oxyCODONE-acetaminophen (PERCOCET) 7.5-325 MG tablet Take 1 tablet by mouth every 4 (four) hours as needed for severe pain. 01/16/18 01/16/19  Earleen Newport, MD  polyethylene glycol [facility]) packet Take 17 g by mouth daily. Patient not taking: Reported on 03/03/2017 05/14/15   Loney Hering, MD    Allergies Patient has no known allergies.    Social History Social History   Tobacco Use  . Smoking status: Current Every Day Smoker    Packs/day: 0.50    Years: 50.00    Pack years: 25.00    Types: Cigarettes  . Smokeless tobacco: Never Used  Substance Use Topics  . Alcohol use: No  . Drug use: No    Comment: [facility]rinain[facility] drug screen + cocaine on admit    Review of Systems Patient denies headaches, rhinorrhea, blurry vision, numbness, shortness of breath, chest pain, edema, cough, abdominal pain, nausea, vomiting, diarrhea, dysuria, fevers, rashes or hallucinations unless otherwise stated above in HPI. ____________________________________________   PHYSICAL EXAM:  VITAL SIGNS: Vitals:   02/04/18 0830 02/04/18 0914  BP: (!) 160/98 (!) 181/99  Pulse: 65 65  Resp: 19 17  Temp:    SpO2: 100% 100%    Constitutional: Alert and oriented.  Eyes: Conjunctivae are normal.  Head: Atraumatic. Nose: No congestion/rhinnorhea. Mouth/Throat: Mucous membranes are moist.   Neck: No stridor. Painless ROM.  Cardiovascular: Normal rate, regular rhythm. Grossly normal heart sounds.  Good  peripheral circulation. Respiratory: Normal respiratory effort.  No retractions. Lungs with bibasilar crackles, no wheeze Gastrointestinal: Soft and nontender. No distention. No abdominal bruits. No CVA tenderness. Genitourinary: deferred Musculoskeletal: No lower extremity tenderness .  2+ BLE edema.  No joint effusions. Neurologic:  Normal speech and language. No gross focal neurologic deficits are appreciated. No facial droop Skin:  Skin is warm, dry and intact. No rash noted. Psychiatric: Mood and affect are normal. Speech and behavior are normal.  ____________________________________________   LABS (all labs ordered are listed, but only abnormal results are displayed)  Results for orders placed or performed during the hospital encounter of 02/04/18 (from the past 24 hour(s))  Comprehensive metabolic panel     Status: Abnormal   Collection Time: 02/04/18  6:36 AM  Result Value Ref Range   Sodium 141 135 - 145 mmol/L   Potassium 3.3 (L) 3.5 - 5.1  mmol/L   Chloride 106 98 - 111 mmol/L   CO2 26 22 - 32 mmol/L   Glucose, Bld 101 (H) 70 - 99 mg/dL   BUN 14 8 - 23 mg/dL   Creatinine, Ser 0.89 0.61 - 1.24 mg/dL   Calcium 8.6 (L) 8.9 - 10.3 mg/dL   Total Protein 6.9 6.5 - 8.1 g/dL   Albumin 3.4 (L) 3.5 - 5.0 g/dL   AST 35 15 - 41 U/L   ALT 26 0 - 44 U/L   Alkaline Phosphatase 80 38 - 126 U/L   Total Bilirubin 1.0 0.3 - 1.2 mg/dL   GFR calc non Af Amer >60 >60 mL/min   GFR calc Af Amer >60 >60 mL/min   Anion gap 9 5 - 15  Brain natriuretic peptide     Status: Abnormal   Collection Time: 02/04/18  6:36 AM  Result Value Ref Range   B Natriuretic Peptide 482.0 (H) 0.0 - 100.0 pg/mL  Troponin I     Status: Abnormal   Collection Time: 02/04/18  6:36 AM  Result Value Ref Range   Troponin I 0.03 (HH) <0.03 ng/mL  CBC with Differential     Status: Abnormal   Collection Time: 02/04/18  6:36 AM  Result Value Ref Range   WBC 3.5 (L) 3.8 - 10.6 K/uL   RBC 4.51 4.40 - 5.90 MIL/uL    Hemoglobin 11.1 (L) 13.0 - 18.0 g/dL   HCT 34.5 (L) 40.0 - 52.0 %   MCV 76.5 (L) 80.0 - 100.0 fL   MCH 24.6 (L) 26.0 - 34.0 pg   MCHC 32.2 32.0 - 36.0 g/dL   RDW 18.0 (H) 11.5 - 14.5 %   Platelets 139 (L) 150 - 440 K/uL   Neutrophils Relative % 62 %   Neutro Abs 2.2 1.4 - 6.5 K/uL   Lymphocytes Relative 27 %   Lymphs Abs 0.9 (L) 1.0 - 3.6 K/uL   Monocytes Relative 7 %   Monocytes Absolute 0.3 0.2 - 1.0 K/uL   Eosinophils Relative 3 %   Eosinophils Absolute 0.1 0 - 0.7 K/uL   Basophils Relative 1 %   Basophils Absolute 0.0 0 - 0.1 K/uL  Protime-INR     Status: Abnormal   Collection Time: 02/04/18  6:36 AM  Result Value Ref Range   Prothrombin Time 16.7 (H) 11.4 - 15.2 seconds   INR 1.36   Urine Drug Screen, Qualitative     Status: None   Collection Time: 02/04/18  6:53 AM  Result Value Ref Range   Tricyclic, Ur Screen NONE DETECTED NONE DETECTED   Amphetamines, Ur Screen NONE DETECTED NONE DETECTED   MDMA (Ecstasy)Ur Screen NONE DETECTED NONE DETECTED   Cocaine Metabolite,Ur [place] NONE DETECTED NONE DETECTED   Opiate, Ur Screen NONE DETECTED NONE DETECTED   Phencyclidine (PCP) Ur S NONE DETECTED NONE DETECTED   Cannabinoid 50 Ng, Ur [place] NONE DETECTED NONE DETECTED   Barbiturates, Ur Screen NONE DETECTED NONE DETECTED   Benzodiazepine, Ur Scrn NONE DETECTED NONE DETECTED   Methadone Scn, Ur NONE DETECTED NONE DETECTED  Ethanol     Status: None   Collection Time: 02/04/18  6:53 AM  Result Value Ref Range   Alcohol, Ethyl (B) <10 <10 mg/dL  Troponin I     Status: None   Collection Time: 02/04/18  9:37 AM  Result Value Ref Range   Troponin I <0.03 <0.03 ng/mL   ____________________________________________  EKG My review and personal interpretation at Time: 6:30  Indication: sob  Rate: 60  Rhythm: afib Axis: normal Other: normal intervals, no stemi ____________________________________________  RADIOLOGY  I personally reviewed all radiographic images ordered to evaluate  for the above acute complaints and reviewed radiology reports and findings.  These findings were personally discussed with the patient.  Please see medical record for radiology report.  ____________________________________________   PROCEDURES  Procedure(s) performed:  Procedures    Critical Care performed: no ____________________________________________   INITIAL IMPRESSION / ASSESSMENT AND PLAN / ED COURSE  Pertinent labs & imaging results that were available during my care of the patient were reviewed by me and considered in my medical decision making (see chart for details).   DDX: Asthma, copd, CHF, pna, ptx, malignancy, Pe, anemia   [name] is a 67 y.o. who presents to the ED with symptoms as described above.  Patient is afebrile without any hypoxia.  Is having intermittent episodes of bradycardia down to the mid 40s but no sustainable bradycardia.  Likely compensatory due to the patient's hypertension.  Does have elevated diastolic pressures and does appear volume overloaded therefore will give dose of Lasix.  No evidence of ischemia.  Doubt infectious process or COPD.  The patient will be placed on continuous pulse oximetry and telemetry for monitoring.  Laboratory evaluation will be sent to evaluate for the above complaints.     Clinical Course as of Feb 04 1030  Sat Feb 04, 2018  5329 Patient does have 3 kg weight gain since last visit.  Presentation concerning for worsening heart failure.  Will serial enzyme and observe for response to Lasix.   [PR]  [phone] Patient reassessed.  Is already having significant output from IV Lasix.  States that shortness of breath is improving.  We will continue to monitor.   [PR]  1030 Patient is already had over 1.5 L of diuresis with improvement in symptoms.  Diastolic now much improved.  Was given a dose of oral clonidine with improvement as well but as he continues to diurese I do not believe this needs to be continued.  He is  not having any chest pain or shortness of breath at this time I do believe he stable and appropriate for outpatient follow-up.  Discussed signs and symptoms for which he should return to the ER.   [PR]    Clinical Course User Index [PR] Merlyn Lot, MD     As part of my medical decision making, I reviewed the following data within the [place] notes reviewed and incorporated, Labs reviewed, notes from prior ED visits and [place] Controlled Substance Database   ____________________________________________   FINAL CLINICAL IMPRESSION(S) / ED DIAGNOSES  Final diagnoses:  Orthopnea  Acute on chronic congestive heart failure, unspecified heart failure type ([place])      NEW MEDICATIONS STARTED DURING THIS VISIT:  New Prescriptions   No medications on file     Note:  This document was prepared using Dragon voice recognition software and may include unintentional dictation errors.    Merlyn Lot, MD 02/04/18 1031

## 2018-02-04 NOTE — Discharge Instructions (Signed)
Please take 2 of your furosemide (Lasix) pills every morning for the next 3 mornings.  Also please start measuring your daily weight.  Return to the ER for any worsening pain, shortness of breath or for any additional questions or concerns.  Please follow-up with cardiology.

## 2018-02-04 NOTE — ED Notes (Signed)
PT  Was able to ambulate while maintaining oxygen saturation above 95%. MD made aware

## 2018-02-04 NOTE — ED Triage Notes (Signed)
Pt reports he was awakened from sleep with a feeling of shortness of breath. Pt also reports having a cough. Pt noted to have irregular heart beat going from low 50's up to the 140's

## 2018-02-05 NOTE — Progress Notes (Signed)
AZARIAN, STARACE ([id number]) Visit Report for 02/03/2018 Arrival Information Details Patient Name: Mark, [name] Date of Service: 02/03/2018 8:15 AM Medical Record Number: [id number] Patient Account Number: [id number] Date of Birth/Sex: 04/20/1951 (67 y.o. M) Treating RN: [name] Primary Care [name]: [name] Other Clinician: Referring [name]: [name] Treating [name]/Extender: [name], [name] in Treatment: 14 Visit Information History Since Last Visit Added or deleted any medications: No Patient Arrived: Ambulatory Any new allergies or adverse reactions: No Arrival Time: 08:28 Had a fall or experienced change in No Accompanied By: self activities of daily living that may affect Transfer Assistance: None risk of falls: Patient Identification Verified: Yes Signs or symptoms of abuse/neglect since last visito No Secondary Verification Process Yes Hospitalized since last visit: No Completed: Implantable device outside of the clinic excluding No Patient Requires Transmission-Based No cellular tissue based products placed in the center Precautions: since last visit: Patient Has Alerts: Yes Has Dressing in Place as Prescribed: Yes Patient Alerts: Patient on Blood Has Compression in Place as Prescribed: Yes Thinner Pain Present Now: Yes DMII warfarin Electronic Signature(s) Signed: 02/03/2018 5:18:43 PM By: [name] Entered By: [name] on 02/03/2018 08:34:52 [name], [name] ([id number]) -------------------------------------------------------------------------------- Compression Therapy Details Patient Name: [name] Date of Service: 02/03/2018 8:15 AM Medical Record Number: [id number] Patient Account Number: [id number] Date of Birth/Sex: 1951/02/19 (67 y.o. M) Treating RN: [name] Primary Care [name]: [name] Other Clinician: Referring [name]: [name] Treating [name]/Extender: [name], [name] in Treatment: 14 Compression Therapy Performed for Wound Assessment: Wound #8 Right,Proximal,Medial Lower Leg Performed By: Clinician Mark Hora, RN Compression Type: Three Layer Pre Treatment ABI: 1.2 Electronic Signature(s) Signed: 02/03/2018 9:56:56 AM By: [name] Entered By: [name] on 02/03/2018 09:56:56 [name], [name] ([id number]) -------------------------------------------------------------------------------- Encounter Discharge Information Details Patient Name: [name] Date of Service: 02/03/2018 8:15 AM Medical Record Number: [id number] Patient Account Number: [id number] Date of Birth/Sex: 09-May-1951 (67 y.o. M) Treating RN: [name] Primary Care [name]: [name] Other Clinician: Referring Avelardo Reesman: [name] Treating Robben Jagiello/Extender: [name], [name] in Treatment: 14 Encounter Discharge Information Items Discharge Condition: Stable Ambulatory Status: Ambulatory Discharge Destination: Home Transportation: Private Auto Accompanied By: self Schedule Follow-up Appointment: Yes Clinical Summary of Care: Electronic Signature(s) Signed: 02/03/2018 9:58:01 AM By: [name] Entered By: [name] on 02/03/2018 09:58:01 [name] ([id number]) -------------------------------------------------------------------------------- Patient/Caregiver Education Details Patient Name: [name] Date of Service: 02/03/2018 8:15 AM Medical Record Number: [id number] Patient Account Number: [id number] Date of Birth/Gender: May 25, 1950 (67 y.o. M) Treating RN: [name] Primary Care Physician: [name] Other Clinician: Referring Physician: Royetta [name] Treating Physician/Extender: [name] in Treatment: 14 Education Assessment Education Provided To: Patient Education Topics Provided Venous: Handouts: Other: leg elevation Methods: Explain/Verbal Responses: State  content correctly Electronic Signature(s) Signed: 02/03/2018 5:18:43 PM By: [name] Entered By: [name] on 02/03/2018 09:57:49 [name], [name] ([id number]) -------------------------------------------------------------------------------- Wound Assessment Details Patient Name: [name] Date of Service: 02/03/2018 8:15 AM Medical Record Number: [id number] Patient Account Number: [id number] Date of Birth/Sex: 1950-08-29 (67 y.o. M) Treating RN: [name] Primary Care [name]: [name] Other Clinician: Referring [name]: [name] Treating [name]/Extender: [name], [name] in Treatment: 14 Wound Status Wound Number: 8 Primary Diabetic Wound/Ulcer of the Lower Extremity Etiology: Wound Location: Right Lower Leg - Medial, Proximal Secondary Lymphedema Wounding Event: Gradually Appeared Etiology: Date Acquired: 10/17/2017 Wound Open [name] Of Treatment: 14 Status: Clustered Wound: No Comorbid Chronic Obstructive Pulmonary Disease History: (COPD), Arrhythmia, Deep Vein Thrombosis,  Hypertension, Type II Diabetes, Gout, Neuropathy Wound Measurements Length: (cm) 4.2 Width: (cm) 2.5 Depth: (cm) 0.3 Area: (cm) 8.247 Volume: (cm) 2.474 % Reduction in Area: -4898.2% % Reduction in Volume: -15362.5% Epithelialization: Small (1-33%) Tunneling: No Undermining: No Wound Description Classification: Grade 2 Foul Odo Wound Margin: Flat and Intact Due to P Exudate Amount: Large Slough/F Exudate Type: Purulent Exudate Color: yellow, brown, green r After Cleansing: Yes roduct Use: No ibrino Yes Wound Bed Granulation Amount: Large (67-100%) Exposed Structure Granulation Quality: Red, Pink Fascia Exposed: No Necrotic Amount: Small (1-33%) Fat Layer (Subcutaneous Tissue) Exposed: Yes Necrotic Quality: Eschar, Adherent Slough Tendon Exposed: No Muscle Exposed: No Joint Exposed: No Bone Exposed: No Periwound Skin Texture Texture  Color No Abnormalities Noted: No No Abnormalities Noted: No Callus: No Atrophie Blanche: No Crepitus: No Cyanosis: No Excoriation: No Ecchymosis: No Induration: No Erythema: No Rash: No Hemosiderin Staining: No Scarring: Yes Mottled: No Pallor: No Moisture Rubor: No [name], [name] ([id number]) No Abnormalities Noted: No Temperature / Pain Dry / Scaly: No Temperature: No Abnormality Maceration: Yes Tenderness on Palpation: Yes Wound Preparation Ulcer Cleansing: Rinsed/Irrigated with Saline, Other: soap and water, Topical Anesthetic Applied: None Treatment Notes Wound #8 (Right, Proximal, Medial Lower Leg) 1. Cleansed with: Cleanse wound with antibacterial soap and water 3. Peri-wound Care: Moisturizing lotion 4. Dressing Applied: Hydrafera Blue Other dressing (specify in notes) 5. Secondary Dressing Applied ABD Pad 7. Secured with 3 Layer Compression System - Right Lower Extremity Notes xtrasorb, unna to Engineer, production) Signed: 02/03/2018 5:18:43 PM By: [name] Entered By: [name] on 02/03/2018 08:42:19

## 2018-02-07 ENCOUNTER — Encounter: Payer: Medicare Other | Admitting: Physician Assistant

## 2018-02-07 DIAGNOSIS — E11622 Type 2 diabetes mellitus with other skin ulcer: Secondary | ICD-10-CM | POA: Diagnosis not present

## 2018-02-09 NOTE — Progress Notes (Signed)
KWASI, JOUNG ([id number]) Visit Report for 02/07/2018 Arrival Information Details Patient Name: LAURENT, CARGILE Date of Service: 02/07/2018 8:15 AM Medical Record Number: [id number] Patient Account Number: [id number] Date of Birth/Sex: [birth year] (67 y.o. M) Treating RN: Roger Shelter Primary Care Beckie Viscardi: Royetta Crochet Other Clinician: Referring Danikah Budzik: Royetta Crochet Treating Zaydyn Havey/Extender: Melburn Hake, HOYT Weeks in Treatment: 15 Visit Information History Since Last Visit All ordered tests and consults were completed: No Patient Arrived: Ambulatory Added or deleted any medications: No Arrival Time: 08:20 Any new allergies or adverse reactions: No Accompanied By: self Had a fall or experienced change in No Transfer Assistance: None activities of daily living that may affect Patient Identification Verified: Yes risk of falls: Secondary Verification Process Yes Signs or symptoms of abuse/neglect since last visito No Completed: Hospitalized since last visit: No Patient Requires Transmission-Based No Implantable device outside of the clinic excluding No Precautions: cellular tissue based products placed in the center Patient Has Alerts: Yes since last visit: Patient Alerts: Patient on Blood Pain Present Now: Yes Thinner DMII warfarin Electronic Signature(s) Signed: 02/07/2018 10:33:34 AM By: Roger Shelter Entered By: Roger Shelter on 02/07/2018 08:21:15 Ikeda, [name] ([id number]) -------------------------------------------------------------------------------- Encounter Discharge Information Details Patient Name: [name] Date of Service: 02/07/2018 8:15 AM Medical Record Number: [id number] Patient Account Number: [id number] Date of Birth/Sex: 1950/07/09 (67 y.o. M) Treating RN: Cornell Barman Primary Care Sloan Takagi: Royetta Crochet Other Clinician: Referring Paco Cislo: Royetta Crochet Treating Kaleena Corrow/Extender: Melburn Hake, HOYT Weeks in  Treatment: 15 Encounter Discharge Information Items Discharge Condition: Stable Ambulatory Status: Ambulatory Discharge Destination: Home Transportation: Private Auto Accompanied By: self Schedule Follow-up Appointment: No Clinical Summary of Care: Post Procedure Vitals: Temperature (F): 98.7 Pulse (bpm): 59 Respiratory Rate (breaths/min): 18 Blood Pressure (mmHg): 147/78 Electronic Signature(s) Signed: 02/07/2018 9:53:11 AM By: Gretta Cool, [name], RN, CWS, [name], [name] Entered By: Gretta Cool, [name], RN, CWS, Kim on 02/07/2018 09:53:10 Jerome, [name] ([id number]) -------------------------------------------------------------------------------- Lower Extremity Assessment Details Patient Name: [name] Date of Service: 02/07/2018 8:15 AM Medical Record Number: [id number] Patient Account Number: [id number] Date of Birth/Sex: [birth year] (67 y.o. M) Treating RN: Roger Shelter Primary Care Dontrae Morini: Royetta Crochet Other Clinician: Referring Marquize Seib: Royetta Crochet Treating Helane Briceno/Extender: STONE III, HOYT Weeks in Treatment: 15 Edema Assessment Assessed: [Left: No] [Right: No] Edema: [Left: Ye] [Right: s] Calf Left: Right: Point of Measurement: 38 cm From Medial Instep cm 42 cm Ankle Left: Right: Point of Measurement: 14 cm From Medial Instep cm 28.5 cm Vascular Assessment Claudication: Claudication Assessment [Right:None] Pulses: Dorsalis Pedis Palpable: [Right:Yes] Posterior Tibial Extremity colors, hair growth, and conditions: Extremity Color: [Right:Hyperpigmented] Hair Growth on Extremity: [Right:No] Temperature of Extremity: [Right:Warm] Capillary Refill: [Right:< 3 seconds] Toe Nail Assessment Left: Right: Thick: Yes Discolored: Yes Deformed: Yes Improper Length and Hygiene: No Electronic Signature(s) Signed: 02/07/2018 10:33:34 AM By: Roger Shelter Entered By: Roger Shelter on 02/07/2018 08:29:37 Cozine, [name]  ([id number]) -------------------------------------------------------------------------------- Multi Wound Chart Details Patient Name: [name] Date of Service: 02/07/2018 8:15 AM Medical Record Number: [id number] Patient Account Number: [id number] Date of Birth/Sex: 1950/06/09 (67 y.o. 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] [N/A:N/A] Secondary Etiology: [8:Lymphedema] [N/A:N/A] Comorbid History: [8:Chronic Obstructive  Pulmonary Disease (COPD), Arrhythmia, Deep Vein Thrombosis, Hypertension, Type II Diabetes, Gout, Neuropathy] [N/A:N/A] Date Acquired: [8:10/17/2017] [N/A:N/A] Weeks of Treatment: [8:15] [N/A:N/A] Wound Status: [8:Open] [N/A:N/A] Measurements L x W x D [8:3.8x2.2x0.3] [N/A:N/A] (cm) Area (cm) : [8:6.566] [N/A:N/A] Volume (cm) : [8:1.97] [N/A:N/A] % Reduction in Area: [8:-3879.40%] [N/A:N/A] % Reduction in Volume: [8:-12212.50%] [N/A:N/A] Classification: [8:Grade 2] [N/A:N/A] Exudate Amount: [8:Large] [N/A:N/A] Exudate Type: [8:Serous] [N/A:N/A] Exudate Color: [8:amber] [N/A:N/A] Foul Odor After Cleansing: [8:Yes] [N/A:N/A] Odor Anticipated Due to [8:No] [N/A:N/A] Product Use: Wound Margin: [8:Flat and Intact] [N/A:N/A] Granulation Amount: [8:Large (67-100%)] [N/A:N/A] Granulation Quality: [8:Red, Pink] [N/A:N/A] Necrotic Amount: [8:Small (1-33%)] [N/A:N/A] Necrotic Tissue: [8:Eschar, Adherent Slough] [N/A:N/A] Exposed  Structures: [8:Fat Layer (Subcutaneous Tissue) Exposed: Yes Fascia: No] [N/A:N/A] Tendon: No Muscle: No Joint: No Bone: No Epithelialization: Small (1-33%) N/A N/A Periwound Skin Texture: Scarring: Yes N/A N/A Excoriation: No Induration: No Callus: No Crepitus: No Rash: No Periwound Skin Moisture: Maceration: Yes N/A N/A Dry/Scaly: No Periwound Skin Color: Atrophie Blanche: No N/A N/A Cyanosis: No Ecchymosis: No Erythema: No Hemosiderin Staining: No Mottled: No Pallor: No Rubor: No Temperature: No Abnormality N/A N/A Tenderness on Palpation: Yes N/A N/A Wound Preparation: Ulcer Cleansing: N/A N/A Rinsed/Irrigated with Saline, Other: soap and water Topical Anesthetic Applied: None, Other: lidocaine 4% Treatment Notes Electronic Signature(s) Signed: 02/08/2018 5:04:30 PM By: Montey Hora Entered By: Montey Hora on 02/07/2018 08:38:44 Vasconcelos, [name] ([id number]) -------------------------------------------------------------------------------- Multi-Disciplinary Care Plan Details Patient Name: [name] Date of Service: 02/07/2018 8:15 AM Medical Record Number: [id number] Patient Account Number: [id number] Date of Birth/Sex: June 18, 1950 (67 y.o. M) Treating RN: Montey Hora Primary Care Kumari Sculley: Royetta Crochet Other Clinician: Referring Gelene Recktenwald: Royetta Crochet Treating Carrye Goller/Extender: Melburn Hake, HOYT Weeks in Treatment: 15 Active Inactive ` Abuse / Safety / Falls / Self Care Management Nursing Diagnoses: Potential for falls Goals: Patient will not experience any injury related to falls Date Initiated: 10/25/2017 Target Resolution Date: 02/18/2018 Goal Status: Active Interventions: Assess Activities of Daily Living upon admission and as needed Assess fall risk on admission and as needed Assess: immobility, friction, shearing, incontinence upon admission and as needed Assess impairment of mobility on admission and as needed per policy Assess  personal safety and home safety (as indicated) on admission and as needed Assess self care needs on admission and as needed Notes: ` Nutrition Nursing Diagnoses: Imbalanced nutrition Impaired glucose control: actual or potential Potential for alteratiion in Nutrition/Potential for imbalanced nutrition Goals: Patient/caregiver agrees to and verbalizes understanding of need to use nutritional supplements and/or vitamins as prescribed Date Initiated: 10/25/2017 Target Resolution Date: 02/18/2018 Goal Status: Active Patient/caregiver will maintain therapeutic glucose control Date Initiated: 10/25/2017 Target Resolution Date: 02/18/2018 Goal Status: Active Interventions: Assess patient nutrition upon admission and as needed per policy Provide education on elevated blood sugars and impact on wound healing Provide education on nutrition CATHERINE, OAK ([id number]) Notes: ` Orientation to the Wound Care Program Nursing Diagnoses: Knowledge deficit related to the wound healing center program Goals: Patient/caregiver will verbalize understanding of the [employer] Program Date Initiated: 10/25/2017 Target Resolution Date: 11/19/2017 Goal Status: Active Interventions: Provide education on orientation to the wound center Notes: ` Wound/Skin Impairment Nursing Diagnoses: Impaired tissue integrity Knowledge deficit related to ulceration/compromised skin integrity Goals: Ulcer/skin breakdown will have a volume reduction of 80% by week 12 Date Initiated: 10/25/2017 Target Resolution Date: 02/11/2018 Goal Status: Active Interventions: Assess patient/caregiver ability to perform ulcer/skin care regimen upon admission and as needed Assess ulceration(s) every visit Notes: Electronic Signature(s) Signed: 02/08/2018 5:04:30  PM By: Montey Hora Entered By: Montey Hora on 02/07/2018 08:38:36 Broxton, [name]  ([id number]) -------------------------------------------------------------------------------- Pain Assessment Details Patient Name: [name] Date of Service: 02/07/2018 8:15 AM Medical Record Number: [id number] Patient Account Number: [id number] Date of Birth/Sex: 1950/10/02 (67 y.o. M) Treating RN: Roger Shelter Primary Care Jancarlo Biermann: Royetta Crochet Other Clinician: Referring Luwana Butrick: Royetta Crochet Treating Caleb Decock/Extender: STONE III, HOYT Weeks in Treatment: 15 Active Problems Location of Pain Severity and Description of Pain Patient Has Paino Yes Site Locations Rate the pain. Current Pain Level: 9 Pain Management and Medication Current Pain Management: Electronic Signature(s) Signed: 02/07/2018 10:33:34 AM By: Roger Shelter Entered By: Roger Shelter on 02/07/2018 08:21:26 Hochberg, [name] ([id number]) -------------------------------------------------------------------------------- Patient/Caregiver Education Details Patient Name: [name] Date of Service: 02/07/2018 8:15 AM Medical Record Number: [id number] Patient Account Number: [id number] Date of Birth/Gender: 09/02/1950 (67 y.o. M) Treating RN: Cornell Barman Primary Care Physician: Royetta Crochet Other Clinician: Referring Physician: Royetta Crochet Treating Physician/Extender: Sharalyn Ink in Treatment: 15 Education Assessment Education Provided To: Patient Education Topics Provided Venous: Handouts: Controlling Swelling with Multilayered Compression Wraps Methods: Explain/Verbal Responses: State content correctly Wound/Skin Impairment: Handouts: Caring for Your Ulcer Methods: Demonstration, Explain/Verbal Responses: State content correctly Electronic Signature(s) Signed: 02/07/2018 4:44:51 PM By: Gretta Cool, [name], RN, CWS, [name], [name] Entered By: Gretta Cool, [name], RN, CWS, Kim on 02/07/2018 09:53:40 Hinderman, [name]  ([id number]) -------------------------------------------------------------------------------- Wound Assessment Details Patient Name: [name] Date of Service: 02/07/2018 8:15 AM Medical Record Number: [id number] Patient Account Number: [id number] Date of Birth/Sex: [birth year] (67 y.o. M) Treating RN: Roger Shelter Primary Care Korbin Mapps: Royetta Crochet Other Clinician: Referring Josearmando Kuhnert: Royetta Crochet Treating Lazarius Rivkin/Extender: STONE III, HOYT Weeks in Treatment: 15 Wound Status Wound Number: 8 Primary Diabetic Wound/Ulcer of the Lower Extremity Etiology: Wound Location: Right Lower Leg - Medial, Proximal Secondary Lymphedema Wounding Event: Gradually Appeared Etiology: Date Acquired: 10/17/2017 Wound Open Weeks Of Treatment: 15 Status: Clustered Wound: No Comorbid Chronic Obstructive Pulmonary Disease History: (COPD), Arrhythmia, Deep Vein Thrombosis, Hypertension, Type II Diabetes, Gout, Neuropathy Photos Photo Uploaded By: Roger Shelter on 02/07/2018 13:55:36 Wound Measurements Length: (cm) 3.8 Width: (cm) 2.2 Depth: (cm) 0.3 Area: (cm) 6.566 Volume: (cm) 1.97 % Reduction in Area: -3879.4% % Reduction in Volume: -12212.5% Epithelialization: Small (1-33%) Tunneling: No Undermining: No Wound Description Classification: Grade 2 Wound Margin: Flat and Intact Exudate Amount: Large Exudate Type: Serous Exudate Color: amber Foul Odor After Cleansing: Yes Due to Product Use: No Slough/Fibrino Yes Wound Bed Granulation Amount: Large (67-100%) Exposed Structure Granulation Quality: Red, Pink Fascia Exposed: No Necrotic Amount: Small (1-33%) Fat Layer (Subcutaneous Tissue) Exposed: Yes Necrotic Quality: Eschar, Adherent Slough Tendon Exposed: No Muscle Exposed: No Joint Exposed: No Vanallen, Zakarie ([id number]) Bone Exposed: No Periwound Skin Texture Texture Color No Abnormalities Noted: No No Abnormalities Noted: No Callus: No Atrophie  Blanche: No Crepitus: No Cyanosis: No Excoriation: No Ecchymosis: No Induration: No Erythema: No Rash: No Hemosiderin Staining: No Scarring: Yes Mottled: No Pallor: No Moisture Rubor: No No Abnormalities Noted: No Dry / Scaly: No Temperature / Pain Maceration: Yes Temperature: No Abnormality Tenderness on Palpation: Yes Wound Preparation Ulcer Cleansing: Rinsed/Irrigated with Saline, Other: soap and water, Topical Anesthetic Applied: None, Other: lidocaine 4%, Treatment Notes Wound #8 (Right, Proximal, Medial Lower Leg) 4. Dressing Applied: Hydrafera Blue 7. Secured with 3 Layer Compression System - Right Lower Extremity Notes xtrasorb, unna to Engineer, production) Signed: 02/07/2018 10:33:34 AM By: Roger Shelter Entered By: Roger Shelter on 02/07/2018 08:27:07 Boonstra, [name] ([id number]) -------------------------------------------------------------------------------- Vitals Details  Patient Name: MARKEY, DEADY Date of Service: 02/07/2018 8:15 AM Medical Record Number: [id number] Patient Account Number: [id number] Date of Birth/Sex: 04/11/1951 (67 y.o. M) Treating RN: Roger Shelter Primary Care Jahki Witham: Royetta Crochet Other Clinician: Referring Johnie Makki: Royetta Crochet Treating Kailan Laws/Extender: STONE III, HOYT Weeks in Treatment: 15 Vital Signs Time Taken: 08:20 Temperature (F): 97.7 Height (in): 74 Pulse (bpm): 59 Weight (lbs): 236 Respiratory Rate (breaths/min): 18 Body Mass Index (BMI): 30.3 Blood Pressure (mmHg): 147/78 Reference Range: 80 - 120 mg / dl Electronic Signature(s) Signed: 02/07/2018 10:33:34 AM By: Roger Shelter Entered By: Roger Shelter on 02/07/2018 08:22:37

## 2018-02-09 NOTE — Progress Notes (Signed)
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 think I did read it last week. We are using silver collagen under 3 layer compression. The patient has PAD and chronic venous insufficiency with inflammation/stasis dermatitis 06/28/17 on evaluation today patient appears to be  doing about the same in regard to his right medial lower extremity ulcer. He has been tolerating the dressing changes without complication. With that being said he does have some epithelialization growing into the wound bed and it appears this wound may be trying to healed in a depressed fashion. To be honest that is not the worst case scenario and I think this may definitely do fine even if it heals in that way. There was no requirement for debridement today as patient appears to be doing very well in regard to his ulcer. His swelling was a little bit more severe than last week but nothing too significant. 07/05/17 on evaluation today patient appears to be doing rather well in regard to his right medial lower extremity ulcer. The wound does appear to be healing although it is healing and somewhat of a cratered fashion the good news is he has excellent epithelium noted. Fortunately there does not appear to be any evidence of infection at this point he is tolerating the dressings as well is the wrap well. 07/12/17 on evaluation today patient appears to be doing very well in regard to his left medial lower extremity ulcer. He has been tolerating the dressing changes without complication. In fact compared to last week this wound which was somewhat concave has fielding dramatically and looks to be doing excellent. I'm extremely happy with the progress. He still has pain rated to be a 4-5/10 but the wound looks great. 07/20/17; the patient's right medial lower extremity ulcer is closed. He has good edema control. Unfortunately he does not really have adequate compression stockings. 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 He has a long- standing history of venous insufficiency, lymphedema stage 1  bordering on stage 2 and hypertension. He has been treated with compression stockings for some time now in fact we have notes having treated him even as far back as 2016 here in our clinic. Fortunately he has excellent blood flow and tends to heal well. Unfortunately despite wearing his compression stockings, keeping his legs elevated, being on fluid pills (Lasix) and doing everything he can to try to help with fluid he still continues to have issues with this reopening. I think that he may be a good candidate for compression/lymphedema pumps. We have never attempted to get these for him previously. No fevers, chills, nausea, or vomiting noted at this time. Currently patient states his ulcer does hurt although not as bad as some in the past it's also not as large as it has been in the past when it reopened he talked is somewhat early. Obviously this is good news. EUELL, SCHIFF ([id number]) 12/13/17 on evaluation today patient appears  to actually be doing very well in regard to his right medial ankle ulcer compared to last week's evaluation. I think having him on the correct antibiotics has made all the difference in the world. With that being said he's not having discomfort like he was previous and in general I feel like he has made excellent strides towards healing in the past week. No fevers, chills, nausea, or vomiting noted at this time. 01/10/18 on evaluation today patient appears to be doing a little worse compared to last week's evaluation. I think that though the Iodoflex has help with cleaning up the surface of the wound subsequently he has having a lot of drainage which I think is causing some issues in that regard. At this point I think we may want to switch the dressings. 01/17/18 on evaluation today patient appears to be doing rather well in regard to his right medial malleolus ulcer. The good news is I do feel like he showing some signs of improvement which is excellent. I feel like  that the [facility] Dressing been better for him than the Iodoflex that we will previously utilizing. He is having much less in the way of discomfort which is good news. Overall very pleased in that regard. Nonetheless the appearance of the wound is improved today as well and that is excellent news. He still has pain but not seemingly as much as previous and I don't feel like there's much maceration either. 01/23/18 on evaluation today patient appears in our clinic a day early for his appointment due to the fact that he was having discomfort and issues with his wrap. Upon inspection of the wound actually appears to be doing fairly well which is good news. With that being said he is having a lot of discomfort at this point unfortunately. I think that this is mainly due to the fact that he is having such drainage as the wound bed itself seems to be doing well. I do believe the [facility] Dressing well is helpful for him which is good news. Nonetheless again I think he may need more frequent dressing changes. 01/31/18 on evaluation today patient actually appears to be doing very well in regard to his right medial ankle ulcer. This is significantly better than what was even noted last week I do think that the extra dressing change with the nurse visit toward the end of the week made a significant improvement for him. Overall I'm very pleased with how things appear today. 02/07/18 on evaluation today patient actually appears to be doing a little better in regard to his wound. The measurements are smaller today compared to previous. Fortunately there's no evidence of any infection and I do believe the moisture is much better control with more frequent wrap changes. Overall the patient seems to be making good progress. Patient History Information obtained from Patient. Family History Heart Disease - Mother,Father, Hypertension - Mother,Father, Stroke - Father, No family history of Cancer, Diabetes,  Hereditary Spherocytosis, Thyroid Problems, Tuberculosis. Social History Current every day smoker, Marital Status - Single, Alcohol Use - Never, Drug Use - No History, Caffeine Use - Daily. Medical History Hospitalization/Surgery History - 05/17/2012, [facility], CVA. - 03/17/2017, [facility], Pneumonia. Medical And Surgical History Notes Ear/Nose/Mouth/Throat difficulty speaking Respiratory Hospitalized for Pneumonia 02/2017 Neurologic CVA in 2014 Review of Systems (ROS) Constitutional Symptoms (General Health) Denies complaints or symptoms of Fever, Chills. Respiratory The patient has no complaints or symptoms. Cardiovascular Complains or has symptoms of LE edema. Brunetto, [name] ([id number])  Psychiatric The patient has no complaints or symptoms. Objective Constitutional Well-nourished and well-hydrated in no acute distress. Vitals Time Taken: 8:20 AM, Height: 74 in, Weight: 236 lbs, BMI: 30.3, Temperature: 97.7 F, Pulse: 59 bpm, Respiratory Rate: 18 breaths/min, Blood Pressure: 147/78 mmHg. Respiratory normal breathing without difficulty. Cardiovascular trace pitting edema of the bilateral lower extremities. Psychiatric this patient is able to make decisions and demonstrates good insight into disease process. Alert and Oriented x 3. pleasant and cooperative. General Notes: Patient's wound bed today did require sharp debridement. He tolerated this with some discomfort unfortunately. With that being said post debridement the wound bed appears to be doing significantly better I was able to remove the majority of the slough from the surface of the wound which was excellent news. Integumentary (Hair, Skin) Wound #8 status is Open. Original cause of wound was Gradually Appeared. The wound is located on the Right,Proximal,Medial Lower Leg. The wound measures 3.8cm length x 2.2cm width x 0.3cm depth; 6.566cm^2 area and 1.97cm^3 volume. There is Fat Layer (Subcutaneous Tissue) Exposed  exposed. There is no tunneling or undermining noted. There is a large amount of serous drainage noted. Foul odor after cleansing was noted. The wound margin is flat and intact. There is large (67-100%) red, pink granulation within the wound bed. There is a small (1-33%) amount of necrotic tissue within the wound bed including Eschar and Adherent Slough. The periwound skin appearance exhibited: Scarring, Maceration. The periwound skin appearance did not exhibit: Callus, Crepitus, Excoriation, Induration, Rash, Dry/Scaly, Atrophie Blanche, Cyanosis, Ecchymosis, Hemosiderin Staining, Mottled, Pallor, Rubor, Erythema. Periwound temperature was noted as No Abnormality. The periwound has tenderness on palpation. 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 There was a Excisional Skin/Subcutaneous Tissue Debridement with a total area of 8.36 sq cm performed by STONE III, Yaniris Braddock E., PA-C. With the following instrument(s): Curette to remove Viable and Non-Viable tissue/material. Material removed includes Subcutaneous Tissue and Slough and after achieving pain control using Lidocaine 4% Topical Solution. No specimens were taken. A time out was conducted at 08:39, prior to the start of the procedure. A Minimum amount of bleeding was controlled with Pressure. The procedure was tolerated well with a pain level of 0 throughout and a pain level of 0 following the procedure. Post Debridement Measurements: 3.8cm length x 2.2cm width x 0.4cm depth; 2.626cm^3 volume. Character  of Wound/Ulcer Post Debridement is improved. Severity of Tissue Post Debridement is: Fat layer exposed. Post procedure Diagnosis Wound #8: Same as Pre-Procedure Plan Wound Cleansing: Wound #8 Right,Proximal,Medial Lower Leg: Clean wound with Normal Saline. Cleanse wound with mild soap and water Anesthetic (add to Medication List): Wound #8 Right,Proximal,Medial Lower Leg: Topical Lidocaine 4% cream applied to wound bed prior to debridement (In Clinic Only). Skin Barriers/Peri-Wound Care: Wound #8 Right,Proximal,Medial Lower Leg: Moisturizing lotion - not on wound area Primary Wound Dressing: Wound #8 Right,Proximal,Medial Lower Leg: Hydrafera Blue Ready Transfer Secondary Dressing: Wound #8 Right,Proximal,Medial Lower Leg: ABD pad XtraSorb Dressing Change Frequency: Wound #8 Right,Proximal,Medial Lower Leg: Change dressing every week Follow-up Appointments: Wound #8 Right,Proximal,Medial Lower Leg: Return Appointment in 1 week. Nurse Visit as needed Edema Control: Wound #8  Right,Proximal,Medial Lower Leg: 3 Layer Compression System - Right Lower Extremity - unna to anchor Other: - order bilateral compression pumps Additional Orders / Instructions: Wound #8 Right,Proximal,Medial Lower Leg: Increase protein intake. [name], [name] ([id number]) I'm gonna suggest currently that we continue with the above wound care measures for the next week. Patient is in agreement with plan. We will subsequently see were things stand at follow-up. He'll continue to come in for the nurse visits as well in order to keep things from getting too macerated and wet I think this has made an improvement for him overall. Please see above for specific wound care orders. We will see patient for re-evaluation in 1 week(s) here in the clinic. If anything worsens or changes patient will contact our office for additional recommendations. Electronic Signature(s) Signed: 02/07/2018 5:36:35 PM By: Worthy Keeler PA-C Entered By: Worthy Keeler on 02/07/2018 08:47:32 [name], [name] -------------------------------------------------------------------------------- ROS/PFSH Details Patient Name: [name] Date of Service: 02/07/2018 8:15 AM Medical Record Number: [id number] Patient Account Number: [id number] Date of Birth/Sex: February 10, 1951 (67 y.o. M) Treating RN: Montey Hora Primary Care Provider: Royetta Crochet Other Clinician: Referring Provider: Royetta Crochet Treating Provider/Extender: Melburn Hake, Antawan Mchugh Weeks in Treatment: 15 Information Obtained From Patient Wound History Do you currently have one or more open woundso Yes How many open wounds do you currently haveo 1 Approximately how long have you had your woundso 1 week How have you been treating your wound(s) until nowo lotion Has your wound(s) ever healed and then re-openedo Yes Have you had any lab work done in the past montho No Have you tested positive for an antibiotic resistant organism (MRSA, VRE)o No Have you tested positive for osteomyelitis (bone infection)o No Have you had any tests for circulation on your legso Yes Who ordered the testo [facility] Where was the test doneo [facility] Constitutional Symptoms (General Health) Complaints and Symptoms: Negative for: Fever; Chills Cardiovascular Complaints and Symptoms: Positive for: LE edema Medical History: Positive for: Arrhythmia - a fib; Deep Vein Thrombosis; Hypertension Negative for: Angina; Congestive Heart Failure; Coronary Artery Disease; Hypotension; Myocardial Infarction; Peripheral Arterial Disease; Peripheral Venous Disease; Phlebitis; Vasculitis Eyes Medical History: Negative for: Cataracts; Glaucoma; Optic Neuritis Ear/Nose/Mouth/Throat Medical History: Negative for: Chronic sinus problems/congestion; Middle ear problems Past Medical History Notes: difficulty speaking Hematologic/Lymphatic Medical History: Negative for: Anemia;  Hemophilia; Human Immunodeficiency Virus; Lymphedema; Sickle Cell Disease Respiratory Shuck, Wylee ([id number]) Complaints and Symptoms: No Complaints or Symptoms Medical History: Positive for: Chronic Obstructive Pulmonary Disease (COPD) Negative for: Aspiration; Asthma; Pneumothorax; Sleep Apnea; Tuberculosis Past Medical History Notes: Hospitalized for Pneumonia 02/2017 Gastrointestinal Medical History: Negative for: Cirrhosis ; Colitis; Crohnos; Hepatitis A; Hepatitis B; Hepatitis C Endocrine Medical History: Positive for: Type II Diabetes Negative for: Type I Diabetes Time with diabetes: since 2017 Treated with: Diet Blood sugar tested every day: No Genitourinary Medical History: Negative for: End Stage Renal Disease Immunological Medical History: Negative for: Lupus Erythematosus; Raynaudos; Scleroderma Integumentary (Skin) Medical History: Negative for: History of Burn; History of pressure wounds Musculoskeletal Medical History: Positive for: Gout Negative for: Rheumatoid Arthritis; Osteoarthritis; Osteomyelitis Neurologic Medical History: Positive for: Neuropathy Negative for: Dementia; Quadriplegia; Paraplegia; Seizure Disorder Past Medical History Notes: CVA in 2014 Oncologic Medical History: Negative for: Received Chemotherapy; Received Radiation Psychiatric JEMARCUS, DOUGAL ([id number]) Complaints and Symptoms: No Complaints or Symptoms Medical History: Negative for: Anorexia/bulimia; Confinement Anxiety Immunizations Pneumococcal Vaccine: Received Pneumococcal Vaccination: Yes Immunization Notes: up to date Implantable Devices Hospitalization / Surgery History Name of [facility]  Purpose of Hospitalization/Surgery Date [facility] CVA 05/17/2012 [facility] Pneumonia 03/17/2017 Family and Social History Cancer: No; Diabetes: No; Heart Disease: Yes - Mother,Father; Hereditary Spherocytosis: No; Hypertension: Yes - Mother,Father; Stroke: Yes - Father;  Thyroid Problems: No; Tuberculosis: No; Current every day smoker; Marital Status - Single; Alcohol Use: Never; Drug Use: No History; Caffeine Use: Daily; Financial Concerns: No; Food, Clothing or Shelter Needs: No; Support System Lacking: No; Transportation Concerns: No; Advanced Directives: No; Patient does not want information on Advanced Directives Physician Affirmation I have reviewed and agree with the above information. Electronic Signature(s) Signed: 02/07/2018 5:36:35 PM By: Worthy Keeler PA-C Signed: 02/08/2018 5:04:30 PM By: Montey Hora Entered By: Worthy Keeler on 02/07/2018 08:44:38 Ganger, [name] ([id number]) -------------------------------------------------------------------------------- SuperBill Details Patient Name: [name] Date of Service: 02/07/2018 Medical Record Number: [id number] Patient Account Number: [id number] Date of Birth/Sex: [birth year] (67 y.o. M) Treating RN: Montey Hora Primary Care Provider: Royetta Crochet Other Clinician: Referring Provider: Royetta Crochet Treating Provider/Extender: Melburn Hake, Denario Bagot Weeks in Treatment: 15 Diagnosis Coding ICD-10 Codes Code Description I87.2 Venous insufficiency (chronic) (peripheral) I89.0 Lymphedema, not elsewhere classified E11.622 Type 2 diabetes mellitus with other skin ulcer L97.812 Non-pressure chronic ulcer of other part of right lower leg with fat layer exposed Facility Procedures CPT4 Code Description: [id number] 11042 - DEB SUBQ TISSUE 20 SQ CM/< ICD-10 Diagnosis Description D40.814 Non-pressure chronic ulcer of other part of right lower leg wit Modifier: h fat layer expo Quantity: 1 sed Physician Procedures CPT4 Code Description: [id number] 11042 - WC PHYS SUBQ TISS 20 SQ CM ICD-10 Diagnosis Description J49.702 Non-pressure chronic ulcer of other part of right lower leg wit Modifier: h fat layer expo Quantity: 1 sed Electronic Signature(s) Signed: 02/07/2018 5:36:35 PM By: Worthy Keeler PA-C Entered By: Worthy Keeler on 02/07/2018 08:47:43

## 2018-02-10 DIAGNOSIS — E11622 Type 2 diabetes mellitus with other skin ulcer: Secondary | ICD-10-CM | POA: Diagnosis not present

## 2018-02-12 NOTE — Progress Notes (Signed)
TRAVANTI, MCMANUS ([id number]) Visit Report for 02/10/2018 Arrival Information Details Patient Name: [name], [name] Date of Service: 02/10/2018 8:00 AM Medical Record Number: [id number] Patient Account Number: [id number] Date of Birth/Sex: 05/18/1950 (67 y.o. M) Treating RN: Roger Shelter Primary Care Emiah Pellicano: Royetta Crochet Other Clinician: Referring Myreon Wimer: Royetta Crochet Treating Mickie Kozikowski/Extender: Melburn Hake, HOYT Weeks in Treatment: 15 Visit Information History Since Last Visit Pain Present Now: No Patient Arrived: Ambulatory Arrival Time: 08:05 Accompanied By: self Transfer Assistance: None Patient Identification Verified: Yes Secondary Verification Process Yes Completed: Patient Requires Transmission-Based No Precautions: Patient Has Alerts: Yes Patient Alerts: Patient on Blood Thinner DMII warfarin Electronic Signature(s) Signed: 02/10/2018 4:22:48 PM By: Roger Shelter Entered By: Roger Shelter on 02/10/2018 08:08:58 Tavella, [name] ([id number]) -------------------------------------------------------------------------------- Clinic Level of Care Assessment Details Patient Name: [name] Date of Service: 02/10/2018 8:00 AM Medical Record Number: [id number] Patient Account Number: [id number] Date of Birth/Sex: [birth year] (67 y.o. M) Treating RN: Roger Shelter Primary Care Rosio Weiss: Royetta Crochet Other Clinician: Referring Lakshya Mcgillicuddy: Royetta Crochet Treating Isabelly Kobler/Extender: Melburn Hake, HOYT Weeks in Treatment: 15 Clinic Level of Care Assessment Items TOOL 4 Quantity Score X - Use when only an EandM is performed on FOLLOW-UP visit 1 0 ASSESSMENTS - Nursing Assessment / Reassessment []  - Reassessment of Co-morbidities (includes updates in patient status) 0 []  - 0 Reassessment of Adherence to Treatment Plan ASSESSMENTS - Wound and Skin Assessment / Reassessment X - Simple Wound Assessment / Reassessment - one wound 1 5 []  - 0 Complex  Wound Assessment / Reassessment - multiple wounds []  - 0 Dermatologic / Skin Assessment (not related to wound area) ASSESSMENTS - Focused Assessment []  - Circumferential Edema Measurements - multi extremities 0 []  - 0 Nutritional Assessment / Counseling / Intervention []  - 0 Lower Extremity Assessment (monofilament, tuning fork, pulses) []  - 0 Peripheral Arterial Disease Assessment (using hand held doppler) ASSESSMENTS - Ostomy and/or Continence Assessment and Care []  - Incontinence Assessment and Management 0 []  - 0 Ostomy Care Assessment and Management (repouching, etc.) PROCESS - Coordination of Care X - Simple Patient / Family Education for ongoing care 1 15 []  - 0 Complex (extensive) Patient / Family Education for ongoing care []  - 0 Staff obtains Programmer, systems, Records, Test Results / Process Orders []  - 0 Staff telephones HHA, Nursing Homes / Clarify orders / etc []  - 0 Routine Transfer to another Facility (non-emergent condition) []  - 0 Routine Hospital Admission (non-emergent condition) []  - 0 New Admissions / Biomedical engineer / Ordering NPWT, Apligraf, etc. []  - 0 Emergency Hospital Admission (emergent condition) X- 1 10 Simple Discharge Coordination Jolliff, Pacey ([id number]) []  - 0 Complex (extensive) Discharge Coordination PROCESS - Special Needs []  - Pediatric / Minor Patient Management 0 []  - 0 Isolation Patient Management []  - 0 Hearing / Language / Visual special needs []  - 0 Assessment of Community assistance (transportation, D/C planning, etc.) []  - 0 Additional assistance / Altered mentation []  - 0 Support Surface(s) Assessment (bed, cushion, seat, etc.) INTERVENTIONS - Wound Cleansing / Measurement X - Simple Wound Cleansing - one wound 1 5 []  - 0 Complex Wound Cleansing - multiple wounds X- 1 5 Wound Imaging (photographs - any number of wounds) []  - 0 Wound Tracing (instead of photographs) X- 1 5 Simple Wound Measurement - one  wound []  - 0 Complex Wound Measurement - multiple wounds INTERVENTIONS - Wound Dressings []  - Small Wound Dressing one or multiple wounds 0 X- 1 15 Medium Wound Dressing one or multiple wounds []  - 0  Large Wound Dressing one or multiple wounds []  - 0 Application of Medications - topical []  - 0 Application of Medications - injection INTERVENTIONS - Miscellaneous []  - External ear exam 0 []  - 0 Specimen Collection (cultures, biopsies, blood, body fluids, etc.) []  - 0 Specimen(s) / Culture(s) sent or taken to Lab for analysis []  - 0 Patient Transfer (multiple staff / Civil Service fast streamer / Similar devices) []  - 0 Simple Staple / Suture removal (25 or less) []  - 0 Complex Staple / Suture removal (26 or more) []  - 0 Hypo / Hyperglycemic Management (close monitor of Blood Glucose) []  - 0 Ankle / Brachial Index (ABI) - do not check if billed separately []  - 0 Vital Signs Elsasser, Ulas ([id number]) Has the patient been seen at the hospital within the last three years: Yes Total Score: 60 Level Of Care: New/Established - Level 2 Electronic Signature(s) Signed: 02/10/2018 4:22:48 PM By: Roger Shelter Entered By: Roger Shelter on 02/10/2018 08:11:42 Bosak, [name] ([id number]) -------------------------------------------------------------------------------- Compression Therapy Details Patient Name: [name] Date of Service: 02/10/2018 8:00 AM Medical Record Number: [id number] Patient Account Number: [id number] Date of Birth/Sex: [birth year] (67 y.o. M) Treating RN: Roger Shelter Primary Care Wilbert Schouten: Royetta Crochet Other Clinician: Referring Adalai Perl: Royetta Crochet Treating Sayaka Hoeppner/Extender: STONE III, HOYT Weeks in Treatment: 15 Compression Therapy Performed for Wound Assessment: Wound #8 Right,Proximal,Medial Lower Leg Performed By: Clinician Roger Shelter, RN Compression Type: Three Layer Electronic Signature(s) Signed: 02/10/2018 4:22:48 PM By:  Roger Shelter Entered By: Roger Shelter on 02/10/2018 08:09:53 Pottle, [name] ([id number]) -------------------------------------------------------------------------------- Encounter Discharge Information Details Patient Name: [name] Date of Service: 02/10/2018 8:00 AM Medical Record Number: [id number] Patient Account Number: [id number] Date of Birth/Sex: 09-11-1950 (67 y.o. M) Treating RN: Roger Shelter Primary Care Jamail Cullers: Royetta Crochet Other Clinician: Referring Cadell Gabrielson: Royetta Crochet Treating Garet Hooton/Extender: Melburn Hake, HOYT Weeks in Treatment: 15 Encounter Discharge Information Items Discharge Condition: Stable Ambulatory Status: Ambulatory Discharge Destination: Home Transportation: Private Auto Schedule Follow-up Appointment: Yes Clinical Summary of Care: Electronic Signature(s) Signed: 02/10/2018 4:22:48 PM By: Roger Shelter Entered By: Roger Shelter on 02/10/2018 08:11:02 Patchin, [name] ([id number]) -------------------------------------------------------------------------------- Patient/Caregiver Education Details Patient Name: [name] Date of Service: 02/10/2018 8:00 AM Medical Record Number: [id number] Patient Account Number: [id number] Date of Birth/Gender: 1950-10-14 (67 y.o. M) Treating RN: Roger Shelter Primary Care Physician: Royetta Crochet Other Clinician: Referring Physician: Royetta Crochet Treating Physician/Extender: Sharalyn Ink in Treatment: 15 Education Assessment Education Provided To: Patient Education Topics Provided Wound/Skin Impairment: Methods: Explain/Verbal Responses: State content correctly Electronic Signature(s) Signed: 02/10/2018 4:22:48 PM By: Roger Shelter Entered By: Roger Shelter on 02/10/2018 08:10:50 Shutes, [name] ([id number]) -------------------------------------------------------------------------------- Wound Assessment Details Patient Name: [name] Date of Service: 02/10/2018 8:00 AM Medical Record Number: [id number] Patient Account Number: [id number] Date of Birth/Sex: 06-11-1950 (67 y.o. M) Treating RN: Roger Shelter Primary Care Laelah Siravo: Royetta Crochet Other Clinician: Referring Mande Auvil: Royetta Crochet Treating Celia Gibbons/Extender: STONE III, HOYT Weeks in Treatment: 15 Wound Status Wound Number: 8 Primary Diabetic Wound/Ulcer of the Lower Extremity Etiology: Wound Location: Right Lower Leg - Medial, Proximal Secondary Lymphedema Wounding Event: Gradually Appeared Etiology: Date Acquired: 10/17/2017 Wound Open Weeks Of Treatment: 15 Status: Clustered Wound: No Comorbid Chronic Obstructive Pulmonary Disease History: (COPD), Arrhythmia, Deep Vein Thrombosis, Hypertension, Type II Diabetes, Gout, Neuropathy Photos Photo Uploaded By: Roger Shelter on 02/10/2018 16:09:38 Wound Measurements Length: (cm) 4 Width: (cm) 3.5 Depth: (cm) 0.3 Area: (cm) 10.996 Volume: (cm) 3.299 % Reduction in Area: -6564.2% % Reduction in Volume: -20518.7% Epithelialization: Small (1-33%)  Tunneling: No Undermining: No Wound Description Classification: Grade 2 Wound Margin: Flat and Intact Exudate Amount: Large Exudate Type: Serous Exudate Color: amber Foul Odor After Cleansing: Yes Due to Product Use: No Slough/Fibrino Yes Wound Bed Granulation Amount: Large (67-100%) Exposed Structure Granulation Quality: Red, Pink Fascia Exposed: No Necrotic Amount: Small (1-33%) Fat Layer (Subcutaneous Tissue) Exposed: Yes Necrotic Quality: Eschar, Adherent Slough Tendon Exposed: No Muscle Exposed: No Joint Exposed: No Holeman, Meric ([id number]) Bone Exposed: No Periwound Skin Texture Texture Color No Abnormalities Noted: No No Abnormalities Noted: No Callus: No Atrophie Blanche: No Crepitus: No Cyanosis: No Excoriation: No Ecchymosis: No Induration: No Erythema: No Rash: No Hemosiderin Staining: No Scarring:  Yes Mottled: No Pallor: No Moisture Rubor: No No Abnormalities Noted: No Dry / Scaly: No Temperature / Pain Maceration: Yes Temperature: No Abnormality Tenderness on Palpation: Yes Wound Preparation Ulcer Cleansing: Rinsed/Irrigated with Saline, Other: soap and water, Topical Anesthetic Applied: None Treatment Notes Wound #8 (Right, Proximal, Medial Lower Leg) 1. Cleansed with: Clean wound with Normal Saline 3. Peri-wound Care: Moisturizing lotion 4. Dressing Applied: Hydrafera Blue 7. Secured with 3 Layer Compression System - Right Lower Extremity Notes xtrasorb, unna to Engineer, production) Signed: 02/10/2018 4:22:48 PM By: Roger Shelter Entered By: Roger Shelter on 02/10/2018 08:09:32

## 2018-02-13 ENCOUNTER — Ambulatory Visit: Payer: Medicare Other | Admitting: Podiatry

## 2018-02-14 ENCOUNTER — Encounter: Payer: Medicare Other | Attending: Physician Assistant | Admitting: Physician Assistant

## 2018-02-14 DIAGNOSIS — E1151 Type 2 diabetes mellitus with diabetic peripheral angiopathy without gangrene: Secondary | ICD-10-CM | POA: Diagnosis not present

## 2018-02-14 DIAGNOSIS — Z8249 Family history of ischemic heart disease and other diseases of the circulatory system: Secondary | ICD-10-CM | POA: Insufficient documentation

## 2018-02-14 DIAGNOSIS — I890 Lymphedema, not elsewhere classified: Secondary | ICD-10-CM | POA: Insufficient documentation

## 2018-02-14 DIAGNOSIS — E11622 Type 2 diabetes mellitus with other skin ulcer: Secondary | ICD-10-CM | POA: Insufficient documentation

## 2018-02-14 DIAGNOSIS — L97812 Non-pressure chronic ulcer of other part of right lower leg with fat layer exposed: Secondary | ICD-10-CM | POA: Insufficient documentation

## 2018-02-14 DIAGNOSIS — Z8673 Personal history of transient ischemic attack (TIA), and cerebral infarction without residual deficits: Secondary | ICD-10-CM | POA: Insufficient documentation

## 2018-02-14 DIAGNOSIS — I872 Venous insufficiency (chronic) (peripheral): Secondary | ICD-10-CM | POA: Insufficient documentation

## 2018-02-14 DIAGNOSIS — J449 Chronic obstructive pulmonary disease, unspecified: Secondary | ICD-10-CM | POA: Diagnosis not present

## 2018-02-14 DIAGNOSIS — Z86718 Personal history of other venous thrombosis and embolism: Secondary | ICD-10-CM | POA: Insufficient documentation

## 2018-02-14 DIAGNOSIS — F17210 Nicotine dependence, cigarettes, uncomplicated: Secondary | ICD-10-CM | POA: Diagnosis not present

## 2018-02-14 DIAGNOSIS — Z7901 Long term (current) use of anticoagulants: Secondary | ICD-10-CM | POA: Insufficient documentation

## 2018-02-14 DIAGNOSIS — E1140 Type 2 diabetes mellitus with diabetic neuropathy, unspecified: Secondary | ICD-10-CM | POA: Insufficient documentation

## 2018-02-14 DIAGNOSIS — Z86711 Personal history of pulmonary embolism: Secondary | ICD-10-CM | POA: Diagnosis not present

## 2018-02-14 DIAGNOSIS — I4891 Unspecified atrial fibrillation: Secondary | ICD-10-CM | POA: Insufficient documentation

## 2018-02-14 DIAGNOSIS — I10 Essential (primary) hypertension: Secondary | ICD-10-CM | POA: Diagnosis not present

## 2018-02-15 NOTE — Progress Notes (Signed)
SPYRIDON, HORNSTEIN ([id number]) Visit Report for 02/14/2018 Arrival Information Details Patient Name: Mark, [name] Date of Service: 02/14/2018 8:00 AM Medical Record Number: [id number] Patient Account Number: [id number] Date of Birth/Sex: [birth year] (67 y.o. M) Treating RN: Montey Hora Primary Care Mathea Frieling: Royetta Crochet Other Clinician: Referring Shital Crayton: Royetta Crochet Treating Jammi Morrissette/Extender: Melburn Hake, HOYT Weeks in Treatment: 81 Visit Information History Since Last Visit Added or deleted any medications: No Patient Arrived: Ambulatory Any new allergies or adverse reactions: No Arrival Time: 07:59 Had a fall or experienced change in No Accompanied By: self activities of daily living that may affect Transfer Assistance: None risk of falls: Patient Identification Verified: Yes Signs or symptoms of abuse/neglect since last visito No Secondary Verification Process Yes Hospitalized since last visit: No Completed: Implantable device outside of the clinic excluding No Patient Requires Transmission-Based No cellular tissue based products placed in the center Precautions: since last visit: Patient Has Alerts: Yes Has Dressing in Place as Prescribed: Yes Patient Alerts: Patient on Blood Pain Present Now: Yes Thinner DMII warfarin Electronic Signature(s) Signed: 02/14/2018 8:47:37 AM By: Lorine Bears RCP, RRT, CHT Entered By: Lorine Bears on 02/14/2018 08:00:42 Skillin, [name] ([id number]) -------------------------------------------------------------------------------- Encounter Discharge Information Details Patient Name: [name] Date of Service: 02/14/2018 8:00 AM Medical Record Number: [id number] Patient Account Number: [id number] Date of Birth/Sex: 11/20/1950 (67 y.o. M) Treating RN: Montey Hora Primary Care Dang Mathison: Royetta Crochet Other Clinician: Referring Jevan Gaunt: Royetta Crochet Treating Dantavious Snowball/Extender:  Melburn Hake, HOYT Weeks in Treatment: 16 Encounter Discharge Information Items Discharge Condition: Stable Ambulatory Status: Ambulatory Discharge Destination: Home Transportation: Private Auto Schedule Follow-up Appointment: Yes Clinical Summary of Care: Post Procedure Vitals: Temperature (F): 97.6 Pulse (bpm): 49 Respiratory Rate (breaths/min): 18 Blood Pressure (mmHg): 124/80 Electronic Signature(s) Signed: 02/14/2018 5:00:45 PM By: Montey Hora Entered By: Montey Hora on 02/14/2018 08:39:42 Sorey, [name] ([id number]) -------------------------------------------------------------------------------- Lower Extremity Assessment Details Patient Name: [name] Date of Service: 02/14/2018 8:00 AM Medical Record Number: [id number] Patient Account Number: [id number] Date of Birth/Sex: 1951/01/06 (67 y.o. M) Treating RN: Cornell Barman Primary Care Yvan Dority: Royetta Crochet Other Clinician: Referring Timmya Blazier: Royetta Crochet Treating Tailer Volkert/Extender: Melburn Hake, HOYT Weeks in Treatment: 16 Edema Assessment Assessed: [Left: No] [Right: No] E[Left: dema] [Right: :] Calf Left: Right: Point of Measurement: 38 cm From Medial Instep cm 44 cm Ankle Left: Right: Point of Measurement: 14 cm From Medial Instep cm 29.2 cm Vascular Assessment Pulses: Dorsalis Pedis Palpable: [Right:Yes] Posterior Tibial Extremity colors, hair growth, and conditions: Extremity Color: [Right:Hyperpigmented] Hair Growth on Extremity: [Right:No] Temperature of Extremity: [Right:Warm] Capillary Refill: [Right:< 3 seconds] Toe Nail Assessment Left: Right: Thick: Yes Discolored: Yes Deformed: Yes Improper Length and Hygiene: No Electronic Signature(s) Signed: 02/14/2018 4:33:57 PM By: Gretta Cool, [name], RN, CWS, [name], [name] Entered By: Gretta Cool, [name], RN, CWS, Kim on 02/14/2018 08:14:39 Bedel, [name]  ([id number]) -------------------------------------------------------------------------------- Multi Wound Chart Details Patient Name: [name] Date of Service: 02/14/2018 8:00 AM Medical Record Number: [id number] Patient Account Number: [id number] Date of Birth/Sex: 08/02/1950 (67 y.o. 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] [N/A:N/A] Secondary Etiology: [8:Lymphedema] [N/A:N/A] Comorbid History: [8:Chronic Obstructive Pulmonary Disease (COPD), Arrhythmia,  Deep Vein Thrombosis, Hypertension, Type II Diabetes, Gout, Neuropathy] [N/A:N/A] Date Acquired: [8:10/17/2017] [N/A:N/A] Weeks of Treatment: [8:16] [N/A:N/A] Wound Status: [8:Open] [N/A:N/A] Measurements L x W x D [8:4.5x2.5x0.3] [N/A:N/A] (cm) Area (cm) : [8:8.836] [N/A:N/A] Volume (cm) : [8:2.651] [N/A:N/A] % Reduction in Area: [8:-5255.20%] [N/A:N/A] % Reduction in Volume: [8:-16468.70%] [N/A:N/A] Classification: [8:Grade 2] [N/A:N/A] Exudate Amount: [8:Large] [N/A:N/A] Exudate Type: [8:Serous] [N/A:N/A] Exudate Color: [8:amber] [N/A:N/A] Foul Odor After Cleansing: [8:Yes] [N/A:N/A] Odor Anticipated Due to [8:No] [N/A:N/A] Product Use: Wound Margin: [8:Flat and Intact] [N/A:N/A] Granulation Amount: [8:Large (67-100%)] [N/A:N/A] Granulation Quality: [8:Red, Pink] [N/A:N/A] Necrotic Amount: [8:Small (1-33%)] [N/A:N/A] Exposed Structures: [8:Fat Layer (Subcutaneous Tissue) Exposed: Yes  Fascia: No Tendon: No] [N/A:N/A] Muscle: No Joint: No Bone: No Epithelialization: Small (1-33%) N/A N/A Periwound Skin Texture: Scarring: Yes N/A N/A Excoriation: No Induration: No Callus: No Crepitus: No Rash: No Periwound Skin Moisture: Maceration: Yes N/A N/A Dry/Scaly: No Periwound Skin Color: Atrophie Blanche: No N/A N/A Cyanosis: No Ecchymosis: No Erythema: No Hemosiderin Staining: No Mottled: No Pallor: No Rubor: No Temperature: No Abnormality N/A N/A Tenderness on Palpation: Yes N/A N/A Wound Preparation: Ulcer Cleansing: N/A N/A Rinsed/Irrigated with Saline, Other: soap and water Topical Anesthetic Applied: Other: lidociane4% Treatment Notes Electronic Signature(s) Signed: 02/14/2018 5:00:45 PM By: Montey Hora Entered By: Montey Hora on 02/14/2018 08:25:42 Kille, [name] ([id number]) -------------------------------------------------------------------------------- Multi-Disciplinary Care Plan Details Patient Name: [name] Date of Service: 02/14/2018 8:00 AM Medical Record Number: [id number] Patient Account Number: [id number] Date of Birth/Sex: March 14, 1951 (67 y.o. M) Treating RN: Montey Hora Primary Care Titianna Loomis: Royetta Crochet Other Clinician: Referring Rylie Knierim: Royetta Crochet Treating Koula Venier/Extender: Melburn Hake, HOYT Weeks in Treatment: 16 Active Inactive ` Abuse / Safety / Falls / Self Care Management Nursing Diagnoses: Potential for falls Goals: Patient will not experience any injury related to falls Date Initiated: 10/25/2017 Target Resolution Date: 02/18/2018 Goal Status: Active Interventions: Assess Activities of Daily Living upon admission and as needed Assess fall risk on admission and as needed Assess: immobility, friction, shearing, incontinence upon admission and as needed Assess impairment of mobility on admission and as needed per policy Assess personal safety and home safety (as indicated) on admission and as  needed Assess self care needs on admission and as needed Notes: ` Nutrition Nursing Diagnoses: Imbalanced nutrition Impaired glucose control: actual or potential Potential for alteratiion in Nutrition/Potential for imbalanced nutrition Goals: Patient/caregiver agrees to and verbalizes understanding of need to use nutritional supplements and/or vitamins as prescribed Date Initiated: 10/25/2017 Target Resolution Date: 02/18/2018 Goal Status: Active Patient/caregiver will maintain therapeutic glucose control Date Initiated: 10/25/2017 Target Resolution Date: 02/18/2018 Goal Status: Active Interventions: Assess patient nutrition upon admission and as needed per policy Provide education on elevated blood sugars and impact on wound healing Provide education on nutrition YONAS, BUNDA ([id number]) Notes: ` Orientation to the Wound Care Program Nursing Diagnoses: Knowledge deficit related to the wound healing center program Goals: Patient/caregiver will verbalize understanding of the [facility] Date Initiated: 10/25/2017 Target Resolution Date: 11/19/2017 Goal Status: Active Interventions: Provide education on orientation to the wound center Notes: ` Wound/Skin Impairment Nursing Diagnoses: Impaired tissue integrity Knowledge deficit related to ulceration/compromised skin integrity Goals: Ulcer/skin breakdown will have a volume reduction of 80% by week 12 Date Initiated: 10/25/2017 Target Resolution Date: 02/11/2018 Goal Status: Active Interventions: Assess patient/caregiver ability to perform ulcer/skin care regimen upon admission and as needed Assess ulceration(s) every visit Notes: Electronic Signature(s) Signed: 02/14/2018 5:00:45 PM By: Montey Hora Entered By: Montey Hora on 02/14/2018 08:23:07 [place],  [name] ([id number]) -------------------------------------------------------------------------------- Pain Assessment Details Patient Name:  [name], [name] Date of Service: 02/14/2018 8:00 AM Medical Record Number: [id number] Patient Account Number: [id number] Date of Birth/Sex: 1950-08-30 (67 y.o. M) Treating RN: Montey Hora Primary Care Liany Mumpower: Royetta Crochet Other Clinician: Referring Advith Martine: Royetta Crochet Treating Angelyse Heslin/Extender: STONE III, HOYT Weeks in Treatment: 16 Active Problems Location of Pain Severity and Description of Pain Patient Has Paino Yes Site Locations Rate the pain. Current Pain Level: 5 Character of Pain Describe the Pain: Burning Pain Management and Medication Current Pain Management: Electronic Signature(s) Signed: 02/14/2018 8:47:37 AM By: Lorine Bears RCP, RRT, CHT Signed: 02/14/2018 5:00:45 PM By: Montey Hora Entered By: Lorine Bears on 02/14/2018 08:00:59 Dillen, [name] ([id number]) -------------------------------------------------------------------------------- Patient/Caregiver Education Details Patient Name: [name] Date of Service: 02/14/2018 8:00 AM Medical Record Number: [id number] Patient Account Number: [id number] Date of Birth/Gender: [birth year] (67 y.o. M) Treating RN: Montey Hora Primary Care Physician: Royetta Crochet Other Clinician: Referring Physician: Royetta Crochet Treating Physician/Extender: Sharalyn Ink in Treatment: 16 Education Assessment Education Provided To: Patient Education Topics Provided Venous: Handouts: Other: wrap precautions Methods: Explain/Verbal Responses: State content correctly Electronic Signature(s) Signed: 02/14/2018 5:00:45 PM By: Montey Hora Entered By: Montey Hora on 02/14/2018 08:39:49 Milstein, [name] ([id number]) -------------------------------------------------------------------------------- Wound Assessment Details Patient Name: [name] Date of Service: 02/14/2018 8:00 AM Medical Record Number: [id number] Patient Account Number:  [id number] Date of Birth/Sex: 1951-01-02 (67 y.o. M) Treating RN: Cornell Barman Primary Care Lacoya Wilbanks: Royetta Crochet Other Clinician: Referring Raea Magallon: Royetta Crochet Treating Taygen Newsome/Extender: STONE III, HOYT Weeks in Treatment: 16 Wound Status Wound Number: 8 Primary Diabetic Wound/Ulcer of the Lower Extremity Etiology: Wound Location: Right Lower Leg - Medial, Proximal Secondary Lymphedema Wounding Event: Gradually Appeared Etiology: Date Acquired: 10/17/2017 Wound Open Weeks Of Treatment: 16 Status: Clustered Wound: No Comorbid Chronic Obstructive Pulmonary Disease History: (COPD), Arrhythmia, Deep Vein Thrombosis, Hypertension, Type II Diabetes, Gout, Neuropathy Photos Photo Uploaded By: Gretta Cool, [name], RN, CWS, Kim on 02/14/2018 09:19:33 Wound Measurements Length: (cm) 4.5 Width: (cm) 2.5 Depth: (cm) 0.3 Area: (cm) 8.836 Volume: (cm) 2.651 % Reduction in Area: -5255.2% % Reduction in Volume: -16468.7% Epithelialization: Small (1-33%) Tunneling: No Undermining: No Wound Description Classification: Grade 2 Foul Odo Wound Margin: Flat and Intact Due to P Exudate Amount: Large Slough/F Exudate Type: Serous Exudate Color: amber r After Cleansing: Yes roduct Use: No ibrino Yes Wound Bed Granulation Amount: Large (67-100%) Exposed Structure Granulation Quality: Red, Pink Fascia Exposed: No Necrotic Amount: Small (1-33%) Fat Layer (Subcutaneous Tissue) Exposed: Yes Necrotic Quality: Adherent Slough Tendon Exposed: No Muscle Exposed: No Joint Exposed: No Masaki, Tylin ([id number]) Bone Exposed: No Periwound Skin Texture Texture Color No Abnormalities Noted: No No Abnormalities Noted: No Callus: No Atrophie Blanche: No Crepitus: No Cyanosis: No Excoriation: No Ecchymosis: No Induration: No Erythema: No Rash: No Hemosiderin Staining: No Scarring: Yes Mottled: No Pallor: No Moisture Rubor: No No Abnormalities Noted: No Dry / Scaly: No  Temperature / Pain Maceration: Yes Temperature: No Abnormality Tenderness on Palpation: Yes Wound Preparation Ulcer Cleansing: Rinsed/Irrigated with Saline, Other: soap and water, Topical Anesthetic Applied: Other: lidociane4%, Treatment Notes Wound #8 (Right, Proximal, Medial Lower Leg) 1. Cleansed with: Clean wound with Normal Saline 2. Anesthetic Topical Lidocaine 4% cream to wound bed prior to debridement 3. Peri-wound Care: Moisturizing lotion 4. Dressing Applied: Hydrafera Blue 5. Secondary Dressing Applied ABD Pad 7. Secured with 3 Layer Compression System - Right Lower Extremity Notes unna to anchor Electronic Signature(s) Signed: 02/14/2018 4:33:57 PM By:  Gretta Cool, [name], RN, CWS, [name], [name] Entered By: Gretta Cool, [name], RN, CWS, Kim on 02/14/2018 08:13:19 Deyoe, [name] ([id number]) -------------------------------------------------------------------------------- Vitals Details Patient Name: [name] Date of Service: 02/14/2018 8:00 AM Medical Record Number: [id number] Patient Account Number: [id number] Date of Birth/Sex: 1951-02-09 (67 y.o. M) Treating RN: Montey Hora Primary Care Anavictoria Wilk: Royetta Crochet Other Clinician: Referring Zaedyn Covin: Royetta Crochet Treating Janavia Rottman/Extender: STONE III, HOYT Weeks in Treatment: 16 Vital Signs Time Taken: 08:00 Temperature (F): 97.6 Height (in): 74 Pulse (bpm): 49 Weight (lbs): 236 Respiratory Rate (breaths/min): 18 Body Mass Index (BMI): 30.3 Blood Pressure (mmHg): 124/80 Reference Range: 80 - 120 mg / dl Electronic Signature(s) Signed: 02/14/2018 8:47:37 AM By: Lorine Bears RCP, RRT, CHT Entered By: Becky Sax, Amado Nash on 02/14/2018 08:05:18

## 2018-02-15 NOTE — Progress Notes (Signed)
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 medial heel and the right medial leg. The area on the foot is just about closed. The area on the right medial leg is improved. He is tolerating the 3 layer  compression without any complaints. The patient has both known PAD and chronic venous reflux [see discussion above]. 05/11/17; the areas on the patient's medial right heel with healed he still has an open area on the right medial leg. This does not require debridement today I think I did read it last week. We are using silver collagen under 3 layer compression. 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 He has been tolerating the dressing changes without complication. With that being said he does have some epithelialization growing into the wound bed and it appears this wound may be trying to healed in a depressed fashion. To be honest that is not the worst case scenario and I think this may definitely do fine even if it heals in that way. There was no requirement for debridement today as patient appears to be doing very well in regard to his ulcer. His swelling was a little bit more severe than last week but nothing too significant. 07/05/17 on evaluation today patient appears to be doing rather well in regard to his right medial lower extremity ulcer. The wound does appear to be healing although it is healing and somewhat of a cratered fashion the good news is he has excellent epithelium noted. Fortunately there does not appear to be any evidence of infection at this point he is tolerating the dressings as well is the wrap well. 07/12/17 on evaluation today patient appears to be doing very well in regard to his left medial lower extremity ulcer. He has been tolerating the dressing changes without complication. In fact compared to last week this wound which was somewhat concave has fielding dramatically and looks to be doing excellent. I'm extremely happy with the progress. He still has pain rated to be a 4-5/10 but the wound looks  great. 07/20/17; the patient's right medial lower extremity ulcer is closed. He has good edema control. Unfortunately he does not really have adequate compression stockings. 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 He has been treated with compression stockings for some time now in fact we have notes having treated him even as far back as 2016 here in our clinic. Fortunately he has excellent blood flow and tends to heal well. Unfortunately despite wearing his compression stockings, keeping his legs elevated, being on fluid pills (Lasix) and doing everything he can  to try to help with fluid he still continues to have issues with this reopening. I think that he may be a good candidate for compression/lymphedema pumps. We have never attempted to get these for him previously. No fevers, chills, nausea, or vomiting noted at this time. Currently patient states his ulcer does hurt although not as bad as some in the past it's also not as large as it has been in the past when it reopened he talked is somewhat early. Obviously this is good news. GAUTHAM, HEWINS ([id number]) 12/13/17 on evaluation today patient appears to actually be doing very well in regard to his right medial ankle ulcer compared to last week's evaluation. I think having him on the correct antibiotics has made all the difference in the world. With that being said he's not having discomfort like he was previous and in general I feel like he has made excellent strides towards healing in the past week. No fevers, chills, nausea, or vomiting noted at this time. 01/10/18 on evaluation today patient appears to be doing a  little worse compared to last week's evaluation. I think that though the Iodoflex has help with cleaning up the surface of the wound subsequently he has having a lot of drainage which I think is causing some issues in that regard. At this point I think we may want to switch the dressings. 01/17/18 on evaluation today patient appears to be doing rather well in regard to his right medial malleolus ulcer. The good news is I do feel like he showing some signs of improvement which is excellent. I feel like that the [facility] Dressing been better for him than the Iodoflex that we will previously utilizing. He is having much less in the way of discomfort which is good news. Overall very pleased in that regard. Nonetheless the appearance of the wound is improved today as well and that is excellent news. He still has pain but not seemingly as much as previous and I don't feel like there's much maceration either. 01/23/18 on evaluation today patient appears in our clinic a day early for his appointment due to the fact that he was having discomfort and issues with his wrap. Upon inspection of the wound actually appears to be doing fairly well which is good news. With that being said he is having a lot of discomfort at this point unfortunately. I think that this is mainly due to the fact that he is having such drainage as the wound bed itself seems to be doing well. I do believe the [facility] Dressing well is helpful for him which is good news. Nonetheless again I think he may need more frequent dressing changes. 01/31/18 on evaluation today patient actually appears to be doing very well in regard to his right medial ankle ulcer. This is significantly better than what was even noted last week I do think that the extra dressing change with the nurse visit toward the end of the week made a significant improvement for him. Overall I'm very pleased with how things appear today. 02/07/18 on evaluation today  patient actually appears to be doing a little better in regard to his wound. The measurements are smaller today compared to previous. Fortunately there's no evidence of any infection and I do believe the moisture is much better control with more frequent wrap changes. Overall the patient seems to be making good progress. 02/14/18 on evaluation today patient actually appears to be doing very well in regard to his medial ankle  ulcer of the right lower extremity. With that being said he saw slough buildup and this is still been somewhat slow to heal. For that reason my suggestion currently is gonna be that we may need to see about checking on a skin substitute possibly Apligraf to see if this will be of benefit for him. In the past he has benefited from Apligraf. Overall there does not appear to be any evidence of infection at this time which is definitely good news. Patient History Information obtained from Patient. Family History Heart Disease - Mother,Father, Hypertension - Mother,Father, Stroke - Father, No family history of Cancer, Diabetes, Hereditary Spherocytosis, Thyroid Problems, Tuberculosis. Social History Current every day smoker, Marital Status - Single, Alcohol Use - Never, Drug Use - No History, Caffeine Use - Daily. Medical History Hospitalization/Surgery History - 05/17/2012, [facility], CVA. - 03/17/2017, [facility], Pneumonia. Medical And Surgical History Notes Ear/Nose/Mouth/Throat difficulty speaking Respiratory Hospitalized for Pneumonia 02/2017 Neurologic CVA in 2014 Review of Systems (ROS) Nordmann, Aron ([id number]) Constitutional Symptoms (General Health) Denies complaints or symptoms of Fever, Chills. Respiratory The patient has no complaints or symptoms. Cardiovascular Complains or has symptoms of LE edema. Psychiatric The patient has no complaints or symptoms. Objective Constitutional Well-nourished and well-hydrated in no acute distress. Vitals Time Taken: 8:00  AM, Height: 74 in, Weight: 236 lbs, BMI: 30.3, Temperature: 97.6 F, Pulse: 49 bpm, Respiratory Rate: 18 breaths/min, Blood Pressure: 124/80 mmHg. Respiratory normal breathing without difficulty. Psychiatric this patient is able to make decisions and demonstrates good insight into disease process. Alert and Oriented x 3. pleasant and cooperative. General Notes: Patient's wound bed did have slough noted on the surface of the wound which did require sharp debridement today. This was performed without complication post debridement the wound bed appears to be doing significantly better which is good news. His swelling is much better controlled with the twice a week wrap changes at this time. Integumentary (Hair, Skin) Wound #8 status is Open. Original cause of wound was Gradually Appeared. The wound is located on the Right,Proximal,Medial Lower Leg. The wound measures 4.5cm length x 2.5cm width x 0.3cm depth; 8.836cm^2 area and 2.651cm^3 volume. There is Fat Layer (Subcutaneous Tissue) Exposed exposed. There is no tunneling or undermining noted. There is a large amount of serous drainage noted. Foul odor after cleansing was noted. The wound margin is flat and intact. There is large (67-100%) red, pink granulation within the wound bed. There is a small (1-33%) amount of necrotic tissue within the wound bed including Adherent Slough. The periwound skin appearance exhibited: Scarring, Maceration. The periwound skin appearance did not exhibit: Callus, Crepitus, Excoriation, Induration, Rash, Dry/Scaly, Atrophie Blanche, Cyanosis, Ecchymosis, Hemosiderin Staining, Mottled, Pallor, Rubor, Erythema. Periwound temperature was noted as No Abnormality. The periwound has tenderness on palpation. 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 There was a Excisional Skin/Subcutaneous Tissue Debridement with a total area of 11.25 sq cm performed by STONE III, HOYT E., PA-C. With the following instrument(s): Curette to remove Viable and Non-Viable tissue/material. Material removed includes Subcutaneous Tissue and Slough and after achieving pain control using Lidocaine 4% Topical Solution. No specimens were taken. A time  out was conducted at 08:26, prior to the start of the procedure. A Minimum amount of bleeding was controlled with Pressure. The procedure was tolerated well with a pain level of 0 throughout and a pain level of 0 following the procedure. Post Debridement Measurements: 4.5cm length x 2.5cm width x 0.3cm depth; 2.651cm^3 volume. Character of Wound/Ulcer Post Debridement is improved. Severity of Tissue Post Debridement is: Fat layer exposed. Post procedure Diagnosis Wound #8: Same as Pre-Procedure Plan Wound Cleansing: Wound #8 Right,Proximal,Medial Lower Leg: Clean wound with Normal Saline. Cleanse wound with mild soap and water Anesthetic (add to Medication List): Wound #8 Right,Proximal,Medial Lower Leg: Topical Lidocaine 4% cream applied to wound bed prior to debridement (In Clinic Only). Skin Barriers/Peri-Wound Care: Wound #8 Right,Proximal,Medial Lower Leg: Moisturizing lotion - not on wound area Primary Wound Dressing: Wound #8 Right,Proximal,Medial Lower Leg: Hydrafera Blue Ready Transfer Secondary Dressing: Wound #8 Right,Proximal,Medial Lower Leg: ABD pad XtraSorb Dressing Change Frequency: Wound #8 Right,Proximal,Medial Lower Leg: Change dressing every week Follow-up Appointments: Wound #8 Right,Proximal,Medial Lower  Leg: Return Appointment in 1 week. Nurse Visit as needed Edema Control: Wound #8 Right,Proximal,Medial Lower Leg: 3 Layer Compression System - Right Lower Extremity - unna to anchor Other: - order bilateral compression pumps Mcpheeters, Siddharth ([id number]) Additional Orders / Instructions: Wound #8 Right,Proximal,Medial Lower Leg: Increase protein intake. I am going to suggest currently that we continue with the above wound care measures for the next two weeks. Time. I'm gonna see in two weeks from now. I will be on vacation next week so we will do a nurse visit on Tuesday and then he'll just wear his wrap from Tuesday until I see him the following Tuesday for this week only. He's in agreement with plan. Subsequently if anything changes or worsens in the interim patient will contact the office for additional recommendations. In the meantime we are gonna see about getting approval for Apligraf for him which I think could be of benefit as well. Please see above for specific wound care orders. We will see patient for re-evaluation in 2 week(s) here in the clinic. If anything worsens or changes patient will contact our office for additional recommendations. Electronic Signature(s) Signed: 02/14/2018 9:56:08 AM By: Worthy Keeler PA-C Entered By: Worthy Keeler on 02/14/2018 08:36:20 Meissner, [name] ([id number]) -------------------------------------------------------------------------------- ROS/PFSH Details Patient Name: [name] Date of Service: 02/14/2018 8:00 AM Medical Record Number: [id number] Patient Account Number: [id number] Date of Birth/Sex: [birth year] (67 y.o. M) Treating RN: Montey Hora Primary Care Provider: Royetta Crochet Other Clinician: Referring Provider: Royetta Crochet Treating Provider/Extender: Melburn Hake, HOYT Weeks in Treatment: 16 Information Obtained From Patient Wound History Do you currently have one or more open woundso Yes How many open  wounds do you currently haveo 1 Approximately how long have you had your woundso 1 week How have you been treating your wound(s) until nowo lotion Has your wound(s) ever healed and then re-openedo Yes Have you had any lab work done in the past montho No Have you tested positive for an antibiotic resistant organism (MRSA, VRE)o No Have you tested positive for osteomyelitis (bone infection)o No Have you had any tests for circulation on your legso Yes Who ordered the testo [facility] Where was the test doneo [facility] Constitutional Symptoms (General Health) Complaints and Symptoms: Negative for: Fever; Chills Cardiovascular Complaints and Symptoms: Positive for: LE edema Medical History: Positive for: Arrhythmia - a fib; Deep Vein Thrombosis; Hypertension Negative for: Angina; Congestive Heart Failure; Coronary Artery Disease; Hypotension; Myocardial  Infarction; Peripheral Arterial Disease; Peripheral Venous Disease; Phlebitis; Vasculitis Eyes Medical History: Negative for: Cataracts; Glaucoma; Optic Neuritis Ear/Nose/Mouth/Throat Medical History: Negative for: Chronic sinus problems/congestion; Middle ear problems Past Medical History Notes: difficulty speaking Hematologic/Lymphatic Medical History: Negative for: Anemia; Hemophilia; Human Immunodeficiency Virus; Lymphedema; Sickle Cell Disease Respiratory Boehner, Mahlik ([id number]) Complaints and Symptoms: No Complaints or Symptoms Medical History: Positive for: Chronic Obstructive Pulmonary Disease (COPD) Negative for: Aspiration; Asthma; Pneumothorax; Sleep Apnea; Tuberculosis Past Medical History Notes: Hospitalized for Pneumonia 02/2017 Gastrointestinal Medical History: Negative for: Cirrhosis ; Colitis; Crohnos; Hepatitis A; Hepatitis B; Hepatitis C Endocrine Medical History: Positive for: Type II Diabetes Negative for: Type I Diabetes Time with diabetes: since 2017 Treated with: Diet Blood sugar tested every day:  No Genitourinary Medical History: Negative for: End Stage Renal Disease Immunological Medical History: Negative for: Lupus Erythematosus; Raynaudos; Scleroderma Integumentary (Skin) Medical History: Negative for: History of Burn; History of pressure wounds Musculoskeletal Medical History: Positive for: Gout Negative for: Rheumatoid Arthritis; Osteoarthritis; Osteomyelitis Neurologic Medical History: Positive for: Neuropathy Negative for: Dementia; Quadriplegia; Paraplegia; Seizure Disorder Past Medical History Notes: CVA in 2014 Oncologic Medical History: Negative for: Received Chemotherapy; Received Radiation Psychiatric ROBY, DONAWAY ([id number]) Complaints and Symptoms: No Complaints or Symptoms Medical History: Negative for: Anorexia/bulimia; Confinement Anxiety Immunizations Pneumococcal Vaccine: Received Pneumococcal Vaccination: Yes Immunization Notes: up to date Implantable Devices Hospitalization / Surgery History Name of Hospital Purpose of Hospitalization/Surgery Date [facility] CVA 05/17/2012 [place] Pneumonia 03/17/2017 Family and Social History Cancer: No; Diabetes: No; Heart Disease: Yes - Mother,Father; Hereditary Spherocytosis: No; Hypertension: Yes - Mother,Father; Stroke: Yes - Father; Thyroid Problems: No; Tuberculosis: No; Current every day smoker; Marital Status - Single; Alcohol Use: Never; Drug Use: No History; Caffeine Use: Daily; Financial Concerns: No; Food, Clothing or Shelter Needs: No; Support System Lacking: No; Transportation Concerns: No; Advanced Directives: No; Patient does not want information on Advanced Directives Physician Affirmation I have reviewed and agree with the above information. Electronic Signature(s) Signed: 02/14/2018 9:56:08 AM By: Worthy Keeler PA-C Signed: 02/14/2018 5:00:45 PM By: Montey Hora Entered By: Worthy Keeler on 02/14/2018 08:35:16 Mckinney, [name]  ([id number]) -------------------------------------------------------------------------------- SuperBill Details Patient Name: [name] Date of Service: 02/14/2018 Medical Record Number: [id number] Patient Account Number: [id number] Date of Birth/Sex: 1950/11/03 (67 y.o. 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 with fat layer exposed Facility Procedures CPT4 Code Description: 02542706 11042 - DEB SUBQ TISSUE 20 SQ CM/< ICD-10 Diagnosis Description C37.628 Non-pressure chronic ulcer of other part of right lower leg wit Modifier: h fat layer expo Quantity: 1 sed Physician Procedures CPT4 Code Description: 3151761 11042 - WC PHYS SUBQ TISS 20 SQ CM ICD-10 Diagnosis Description Y07.371 Non-pressure chronic ulcer of other part of right lower leg wit Modifier: h fat layer expo Quantity: 1 sed Electronic Signature(s) Signed: 02/14/2018 9:56:08 AM By: Worthy Keeler PA-C Entered By: Worthy Keeler on 02/14/2018 08:36:31

## 2018-02-17 DIAGNOSIS — E11622 Type 2 diabetes mellitus with other skin ulcer: Secondary | ICD-10-CM | POA: Diagnosis not present

## 2018-02-19 NOTE — Progress Notes (Addendum)
DONOVIN, KRAEMER ([id number]) Visit Report for 02/17/2018 Arrival Information Details Patient Name: [name], [name] Date of Service: 02/17/2018 8:00 AM Medical Record Number: [id number] Patient Account Number: [id number] Date of Birth/Sex: [birth year] (67 y.o. M) Treating RN: Roger Shelter Primary Care Azarah Dacy: Royetta Crochet Other Clinician: Referring Terrie Haring: Royetta Crochet Treating Mailyn Steichen/Extender: Melburn Hake, HOYT Weeks [name] Treatment: 35 Visit Information History Since Last Visit All ordered tests and consults were completed: No Patient Arrived: Ambulatory Added or deleted any medications: No Arrival Time: 08:00 Any new allergies or adverse reactions: No Accompanied By: self Had a fall or experienced change [name] No Transfer Assistance: None activities of daily living that may affect Patient Identification Verified: Yes risk of falls: Secondary Verification Process Yes Signs or symptoms of abuse/neglect since last visito No Completed: Hospitalized since last visit: No Patient Requires Transmission-Based No Implantable device outside of the clinic excluding No Precautions: cellular tissue based products placed [name] the center Patient Has Alerts: Yes since last visit: Patient Alerts: Patient on Blood Pain Present Now: Yes Thinner DMII warfarin Electronic Signature(s) Signed: 02/17/2018 3:59:43 PM By: Roger Shelter Entered By: Roger Shelter on 02/17/2018 08:00:59 Teresi, [name] ([id number]) -------------------------------------------------------------------------------- Clinic Level of Care Assessment Details Patient Name: [name] Date of Service: 02/17/2018 8:00 AM Medical Record Number: [id number] Patient Account Number: [id number] Date of Birth/Sex: [birth year] (67 y.o. M) Treating RN: Cornell Barman Primary Care Doni Bacha: Royetta Crochet Other Clinician: Referring Athol Bolds: Royetta Crochet Treating Suzzanne Brunkhorst/Extender: Melburn Hake, HOYT Weeks [name]  Treatment: 16 Clinic Level of Care Assessment Items TOOL 1 Quantity Score []  - Use when EandM and Procedure is performed on INITIAL visit 0 ASSESSMENTS - Nursing Assessment / Reassessment []  - General Physical Exam (combine w/ comprehensive assessment (listed just below) when 0 performed on new pt. evals) []  - 0 Comprehensive Assessment (HX, ROS, Risk Assessments, Wounds Hx, etc.) ASSESSMENTS - Wound and Skin Assessment / Reassessment []  - Dermatologic / Skin Assessment (not related to wound area) 0 ASSESSMENTS - Ostomy and/or Continence Assessment and Care []  - Incontinence Assessment and Management 0 []  - 0 Ostomy Care Assessment and Management (repouching, etc.) PROCESS - Coordination of Care []  - Simple Patient / Family Education for ongoing care 0 []  - 0 Complex (extensive) Patient / Family Education for ongoing care []  - 0 Staff obtains Programmer, systems, Records, Test Results / Process Orders []  - 0 Staff telephones HHA, Nursing Homes / Clarify orders / etc []  - 0 Routine Transfer to another Facility (non-emergent condition) []  - 0 Routine Hospital Admission (non-emergent condition) []  - 0 New Admissions / Biomedical engineer / Ordering NPWT, Apligraf, etc. []  - 0 Emergency Hospital Admission (emergent condition) PROCESS - Special Needs []  - Pediatric / Minor Patient Management 0 []  - 0 Isolation Patient Management []  - 0 Hearing / Language / Visual special needs []  - 0 Assessment of Community assistance (transportation, D/C planning, etc.) []  - 0 Additional assistance / Altered mentation []  - 0 Support Surface(s) Assessment (bed, cushion, seat, etc.) Kempa, Alyus ([id number]) INTERVENTIONS - Miscellaneous []  - External ear exam 0 []  - 0 Patient Transfer (multiple staff / Civil Service fast streamer / Similar devices) []  - 0 Simple Staple / Suture removal (25 or less) []  - 0 Complex Staple / Suture removal (26 or more) []  - 0 Hypo/Hyperglycemic Management (do not check  if billed separately) []  - 0 Ankle / Brachial Index (ABI) - do not check if billed separately Has the patient been seen at the hospital within the last three years: Yes Total Score: 0  Level Of Care: ____ Electronic Signature(s) Signed: 02/17/2018 5:11:12 PM By: Gretta Cool, [name], RN, CWS, [name], [name] Entered By: Gretta Cool, [name], RN, CWS, Kim on 02/17/2018 16:52:20 [place], [name] ([id number]) -------------------------------------------------------------------------------- Encounter Discharge Information Details Patient Name: [name] Date of Service: 02/17/2018 8:00 AM Medical Record Number: [id number] Patient Account Number: [id number] Date of Birth/Sex: 1950-12-01 (67 y.o. M) Treating RN: Cornell Barman Primary Care Davis Vannatter: Royetta Crochet Other Clinician: Referring Virginie Josten: Royetta Crochet Treating Khoen Genet/Extender: Melburn Hake, HOYT Weeks [name] Treatment: 16 Encounter Discharge Information Items Discharge Condition: Stable Ambulatory Status: Ambulatory Discharge Destination: Home Transportation: Private Auto Accompanied By: self Schedule Follow-up Appointment: No Clinical Summary of Care: Electronic Signature(s) Signed: 02/17/2018 4:52:13 PM By: Gretta Cool, [name], RN, CWS, [name], [name] Entered By: Gretta Cool, [name], RN, CWS, Kim on 02/17/2018 16:52:12 Notaro, [name] ([id number]) -------------------------------------------------------------------------------- Wound Assessment Details Patient Name: [name] Date of Service: 02/17/2018 8:00 AM Medical Record Number: [id number] Patient Account Number: [id number] Date of Birth/Sex: Jun 01, 1950 (67 y.o. M) Treating RN: Roger Shelter Primary Care Ha Placeres: Royetta Crochet Other Clinician: Referring Martinez Boxx: Royetta Crochet Treating Cheria Sadiq/Extender: STONE III, HOYT Weeks [name] Treatment: 16 Wound Status Wound Number: 8 Primary Diabetic Wound/Ulcer of the Lower Extremity Etiology: Wound Location: Right Lower Leg - Medial,  Proximal Secondary Lymphedema Wounding Event: Gradually Appeared Etiology: Date Acquired: 10/17/2017 Wound Open Weeks Of Treatment: 16 Status: Clustered Wound: No Comorbid Chronic Obstructive Pulmonary Disease History: (COPD), Arrhythmia, Deep Vein Thrombosis, Hypertension, Type II Diabetes, Gout, Neuropathy Photos Photo Uploaded By: Roger Shelter on 02/17/2018 09:12:55 Wound Measurements Length: (cm) 4.5 Width: (cm) 2.5 Depth: (cm) 0.3 Area: (cm) 8.836 Volume: (cm) 2.651 % Reduction [name] Area: -5255.2% % Reduction [name] Volume: -16468.7% Epithelialization: Small (1-33%) Tunneling: No Undermining: No Wound Description Classification: Grade 2 Wound Margin: Flat and Intact Exudate Amount: Large Exudate Type: Serous Exudate Color: amber Foul Odor After Cleansing: No Slough/Fibrino Yes Wound Bed Granulation Amount: Large (67-100%) Exposed Structure Granulation Quality: Red, Pink Fascia Exposed: No Necrotic Amount: Small (1-33%) Fat Layer (Subcutaneous Tissue) Exposed: Yes Necrotic Quality: Adherent Slough Tendon Exposed: No Muscle Exposed: No Joint Exposed: No Bourbeau, Kurt ([id number]) Bone Exposed: No Periwound Skin Texture Texture Color No Abnormalities Noted: No No Abnormalities Noted: No Callus: No Atrophie Blanche: No Crepitus: No Cyanosis: No Excoriation: No Ecchymosis: No Induration: No Erythema: No Rash: No Hemosiderin Staining: No Scarring: Yes Mottled: No Pallor: No Moisture Rubor: No No Abnormalities Noted: No Dry / Scaly: No Temperature / Pain Maceration: Yes Temperature: No Abnormality Tenderness on Palpation: Yes Wound Preparation Ulcer Cleansing: Rinsed/Irrigated with Saline, Other: soap and water, Topical Anesthetic Applied: None Treatment Notes Wound #8 (Right, Proximal, Medial Lower Leg) 1. Cleansed with: Cleanse wound with antibacterial soap and water 4. Dressing Applied: Hydrafera Blue 7. Secured with 3 Layer  Compression System - Right Lower Extremity Notes X sorb, unna to anchor Electronic Signature(s) Signed: 02/17/2018 3:59:43 PM By: Roger Shelter Entered By: Roger Shelter on 02/17/2018 08:07:49

## 2018-02-20 ENCOUNTER — Ambulatory Visit (INDEPENDENT_AMBULATORY_CARE_PROVIDER_SITE_OTHER): Payer: Medicare Other | Admitting: Podiatry

## 2018-02-20 ENCOUNTER — Encounter: Payer: Self-pay | Admitting: Podiatry

## 2018-02-20 DIAGNOSIS — B351 Tinea unguium: Secondary | ICD-10-CM | POA: Diagnosis not present

## 2018-02-20 DIAGNOSIS — E1142 Type 2 diabetes mellitus with diabetic polyneuropathy: Secondary | ICD-10-CM | POA: Diagnosis not present

## 2018-02-20 DIAGNOSIS — M79676 Pain in unspecified toe(s): Secondary | ICD-10-CM | POA: Diagnosis not present

## 2018-02-20 NOTE — Progress Notes (Signed)
Complaint:  Visit Type: Patient returns to my office for continued preventative foot care services. Complaint: Patient states" my nails have grown long and thick and become painful to walk and wear shoes" Patient has been diagnosed with DM with neuropathy.. The patient presents for preventative foot care services. No changes to ROS.  Patient is being treated for leg ulcers right leg.  Podiatric Exam: Vascular: dorsalis pedis and posterior tibial pulses are palpable bilateral. Capillary return is immediate. Temperature gradient is WNL. Skin turgor WNL .  Venous stasis leg/foot  B/L. Sensorium: Diminished  Semmes Weinstein monofilament test. Normal tactile sensation bilaterally. Nail Exam: Pt has thick disfigured discolored nails with subungual debris noted bilateral entire nail hallux through fifth toenails Ulcer Exam: There is no evidence of ulcer or pre-ulcerative changes or infection. Orthopedic Exam: Muscle tone and strength are WNL. No limitations in general ROM. No crepitus or effusions noted. Foot type and digits show no abnormalities. DJD midfoot right. Skin: No Porokeratosis. No infection or ulcers  Diagnosis:  Onychomycosis, , Pain in right toe, pain in left toes  Treatment & Plan Procedures and Treatment: Consent by patient was obtained for treatment procedures.   Debridement of mycotic and hypertrophic toenails, 1 through 5 bilateral and clearing of subungual debris. No ulceration, no infection noted.  Return Visit-Office Procedure: Patient instructed to return to the office for a follow up visit 3 months for continued evaluation and treatment.    Gardiner Barefoot DPM

## 2018-02-21 DIAGNOSIS — E11622 Type 2 diabetes mellitus with other skin ulcer: Secondary | ICD-10-CM | POA: Diagnosis not present

## 2018-02-23 NOTE — Progress Notes (Signed)
DUC, CROCKET ([id number]) Visit Report for 02/21/2018 Arrival Information Details Patient Name: JAJA, SWITALSKI Date of Service: 02/21/2018 8:30 AM Medical Record Number: [id number] Patient Account Number: [id number] Date of Birth/Sex: 08/12/1950 (67 y.o. M) Treating RN: Montey Hora Primary Care Kimbra Marcelino: Royetta Crochet Other Clinician: Referring Elim Economou: Royetta Crochet Treating Shiven Junious/Extender: Melburn Hake, HOYT Weeks in Treatment: 52 Visit Information History Since Last Visit Added or deleted any medications: No Patient Arrived: Ambulatory Any new allergies or adverse reactions: No Arrival Time: 08:29 Had a fall or experienced change in No Accompanied By: self activities of daily living that may affect Transfer Assistance: None risk of falls: Patient Identification Verified: Yes Signs or symptoms of abuse/neglect since last visito No Secondary Verification Process Yes Hospitalized since last visit: No Completed: Implantable device outside of the clinic excluding No Patient Requires Transmission-Based No cellular tissue based products placed in the center Precautions: since last visit: Patient Has Alerts: Yes Has Dressing in Place as Prescribed: Yes Patient Alerts: Patient on Blood Has Compression in Place as Prescribed: Yes Thinner Pain Present Now: Yes DMII warfarin Electronic Signature(s) Signed: 02/21/2018 11:54:50 AM By: Montey Hora Entered By: Montey Hora on 02/21/2018 08:29:53 Doescher, [name] ([id number]) -------------------------------------------------------------------------------- Clinic Level of Care Assessment Details Patient Name: [name] Date of Service: 02/21/2018 8:30 AM Medical Record Number: [id number] Patient Account Number: [id number] Date of Birth/Sex: November 10, 1950 (67 y.o. M) Treating RN: Montey Hora Primary Care Jorja Empie: Royetta Crochet Other Clinician: Referring Lorene Klimas: Royetta Crochet Treating  Raliyah Montella/Extender: Melburn Hake, HOYT Weeks in Treatment: 17 Clinic Level of Care Assessment Items TOOL 4 Quantity Score X - Use when only an EandM is performed on FOLLOW-UP visit 1 0 ASSESSMENTS - Nursing Assessment / Reassessment []  - Reassessment of Co-morbidities (includes updates in patient status) 0 X- 1 5 Reassessment of Adherence to Treatment Plan ASSESSMENTS - Wound and Skin Assessment / Reassessment X - Simple Wound Assessment / Reassessment - one wound 1 5 []  - 0 Complex Wound Assessment / Reassessment - multiple wounds []  - 0 Dermatologic / Skin Assessment (not related to wound area) ASSESSMENTS - Focused Assessment []  - Circumferential Edema Measurements - multi extremities 0 []  - 0 Nutritional Assessment / Counseling / Intervention []  - 0 Lower Extremity Assessment (monofilament, tuning fork, pulses) []  - 0 Peripheral Arterial Disease Assessment (using hand held doppler) ASSESSMENTS - Ostomy and/or Continence Assessment and Care []  - Incontinence Assessment and Management 0 []  - 0 Ostomy Care Assessment and Management (repouching, etc.) PROCESS - Coordination of Care X - Simple Patient / Family Education for ongoing care 1 15 []  - 0 Complex (extensive) Patient / Family Education for ongoing care []  - 0 Staff obtains Programmer, systems, Records, Test Results / Process Orders []  - 0 Staff telephones HHA, Nursing Homes / Clarify orders / etc []  - 0 Routine Transfer to another Facility (non-emergent condition) []  - 0 Routine Hospital Admission (non-emergent condition) []  - 0 New Admissions / Biomedical engineer / Ordering NPWT, Apligraf, etc. []  - 0 Emergency Hospital Admission (emergent condition) X- 1 10 Simple Discharge Coordination Rondon, Tallan ([id number]) []  - 0 Complex (extensive) Discharge Coordination PROCESS - Special Needs []  - Pediatric / Minor Patient Management 0 []  - 0 Isolation Patient Management []  - 0 Hearing / Language / Visual special  needs []  - 0 Assessment of Community assistance (transportation, D/C planning, etc.) []  - 0 Additional assistance / Altered mentation []  - 0 Support Surface(s) Assessment (bed, cushion, seat, etc.) INTERVENTIONS - Wound Cleansing / Measurement X - Simple Wound Cleansing -  one wound 1 5 []  - 0 Complex Wound Cleansing - multiple wounds X- 1 5 Wound Imaging (photographs - any number of wounds) []  - 0 Wound Tracing (instead of photographs) X- 1 5 Simple Wound Measurement - one wound []  - 0 Complex Wound Measurement - multiple wounds INTERVENTIONS - Wound Dressings X - Small Wound Dressing one or multiple wounds 1 10 []  - 0 Medium Wound Dressing one or multiple wounds []  - 0 Large Wound Dressing one or multiple wounds []  - 0 Application of Medications - topical []  - 0 Application of Medications - injection INTERVENTIONS - Miscellaneous []  - External ear exam 0 []  - 0 Specimen Collection (cultures, biopsies, blood, body fluids, etc.) []  - 0 Specimen(s) / Culture(s) sent or taken to Lab for analysis []  - 0 Patient Transfer (multiple staff / Civil Service fast streamer / Similar devices) []  - 0 Simple Staple / Suture removal (25 or less) []  - 0 Complex Staple / Suture removal (26 or more) []  - 0 Hypo / Hyperglycemic Management (close monitor of Blood Glucose) []  - 0 Ankle / Brachial Index (ABI) - do not check if billed separately X- 1 5 Vital Signs Word, Sael ([id number]) Has the patient been seen at the hospital within the last three years: Yes Total Score: 65 Level Of Care: New/Established - Level 2 Electronic Signature(s) Signed: 02/21/2018 11:54:50 AM By: Montey Hora Entered By: Montey Hora on 02/21/2018 08:40:58 Colton, [name] ([id number]) -------------------------------------------------------------------------------- Compression Therapy Details Patient Name: [name] Date of Service: 02/21/2018 8:30 AM Medical Record Number: [id number] Patient  Account Number: [id number] Date of Birth/Sex: 01-27-1951 (67 y.o. M) Treating RN: Montey Hora Primary Care Moses Odoherty: Royetta Crochet Other Clinician: Referring Carsin Randazzo: Royetta Crochet Treating Jermain Curt/Extender: STONE III, HOYT Weeks in Treatment: 17 Compression Therapy Performed for Wound Assessment: Wound #8 Right,Proximal,Medial Lower Leg Performed By: Clinician Montey Hora, RN Compression Type: Three Layer Electronic Signature(s) Signed: 02/21/2018 11:54:50 AM By: Montey Hora Entered By: Montey Hora on 02/21/2018 08:39:28 Almario, [name] ([id number]) -------------------------------------------------------------------------------- Encounter Discharge Information Details Patient Name: [name] Date of Service: 02/21/2018 8:30 AM Medical Record Number: [id number] Patient Account Number: [id number] Date of Birth/Sex: 04-20-1951 (67 y.o. M) Treating RN: Montey Hora Primary Care Nayson Traweek: Royetta Crochet Other Clinician: Referring Ithan Touhey: Royetta Crochet Treating Mallory Schaad/Extender: Melburn Hake, HOYT Weeks in Treatment: 17 Encounter Discharge Information Items Discharge Condition: Stable Ambulatory Status: Ambulatory Discharge Destination: Home Transportation: Private Auto Schedule Follow-up Appointment: Yes Clinical Summary of Care: Electronic Signature(s) Signed: 02/21/2018 11:54:50 AM By: Montey Hora Entered By: Montey Hora on 02/21/2018 08:40:37 [place], [name] ([id number]) -------------------------------------------------------------------------------- Pain Assessment Details Patient Name: [name] Date of Service: 02/21/2018 8:30 AM Medical Record Number: [id number] Patient Account Number: [id number] Date of Birth/Sex: January 01, 1951 (67 y.o. M) Treating RN: Montey Hora Primary Care Embry Manrique: Royetta Crochet Other Clinician: Referring Mykah Bellomo: Royetta Crochet Treating Merlin Golden/Extender: STONE III, HOYT Weeks in Treatment: 17 Active  Problems Location of Pain Severity and Description of Pain Patient Has Paino Yes Site Locations Pain Location: Pain in Ulcers With Dressing Change: Yes Duration of the Pain. Constant / Intermittento Constant Pain Management and Medication Current Pain Management: Electronic Signature(s) Signed: 02/21/2018 11:54:50 AM By: Montey Hora Entered By: Montey Hora on 02/21/2018 08:30:15 [place]rossa, [name] ([id number]) -------------------------------------------------------------------------------- Patient/Caregiver Education Details Patient Name: [name] Date of Service: 02/21/2018 8:30 AM Medical Record Number: [id number] Patient Account Number: [id number] Date of Birth/Gender: 09-28-1950 (67 y.o. M) Treating RN: Montey Hora Primary Care Physician: Royetta Crochet Other Clinician: Referring Physician: Royetta Crochet Treating Physician/Extender: Melburn Hake, HOYT  Weeks in Treatment: 17 Education Assessment Education Provided To: Patient Education Topics Provided Wound/Skin Impairment: Handouts: Caring for Your Ulcer Methods: Explain/Verbal Responses: State content correctly Electronic Signature(s) Signed: 02/21/2018 11:54:50 AM By: Montey Hora Entered By: Montey Hora on 02/21/2018 08:40:22 Mccuiston, [name] ([id number]) -------------------------------------------------------------------------------- Wound Assessment Details Patient Name: [name] Date of Service: 02/21/2018 8:30 AM Medical Record Number: [id number] Patient Account Number: [id number] Date of Birth/Sex: 28-Sep-1950 (67 y.o. M) Treating RN: Montey Hora Primary Care Brayan Votaw: Royetta Crochet Other Clinician: Referring Mikenzie Mccannon: Royetta Crochet Treating Hartlee Amedee/Extender: STONE III, HOYT Weeks in Treatment: 17 Wound Status Wound Number: 8 Primary Diabetic Wound/Ulcer of the Lower Extremity Etiology: Wound Location: Right Lower Leg - Medial, Proximal Secondary Lymphedema Wounding  Event: Gradually Appeared Etiology: Date Acquired: 10/17/2017 Wound Open Weeks Of Treatment: 17 Status: Clustered Wound: No Comorbid Chronic Obstructive Pulmonary Disease History: (COPD), Arrhythmia, Deep Vein Thrombosis, Hypertension, Type II Diabetes, Gout, Neuropathy Photos Photo Uploaded By: Montey Hora on 02/21/2018 08:55:10 Wound Measurements Length: (cm) 4.5 Width: (cm) 2.5 Depth: (cm) 0.3 Area: (cm) 8.836 Volume: (cm) 2.651 % Reduction in Area: -5255.2% % Reduction in Volume: -16468.7% Epithelialization: Small (1-33%) Tunneling: No Undermining: No Wound Description Classification: Grade 2 Wound Margin: Flat and Intact Exudate Amount: Large Exudate Type: Serous Exudate Color: amber Foul Odor After Cleansing: No Slough/Fibrino Yes Wound Bed Granulation Amount: Large (67-100%) Exposed Structure Granulation Quality: Red, Pink Fascia Exposed: No Necrotic Amount: Small (1-33%) Fat Layer (Subcutaneous Tissue) Exposed: Yes Necrotic Quality: Adherent Slough Tendon Exposed: No Muscle Exposed: No Joint Exposed: No Massaro, Harman ([id number]) Bone Exposed: No Periwound Skin Texture Texture Color No Abnormalities Noted: No No Abnormalities Noted: No Callus: No Atrophie Blanche: No Crepitus: No Cyanosis: No Excoriation: No Ecchymosis: No Induration: No Erythema: No Rash: No Hemosiderin Staining: No Scarring: Yes Mottled: No Pallor: No Moisture Rubor: No No Abnormalities Noted: No Dry / Scaly: No Temperature / Pain Maceration: Yes Temperature: No Abnormality Tenderness on Palpation: Yes Wound Preparation Ulcer Cleansing: Rinsed/Irrigated with Saline, Other: soap and water, Topical Anesthetic Applied: None Treatment Notes Wound #8 (Right, Proximal, Medial Lower Leg) 1. Cleansed with: Clean wound with Normal Saline Cleanse wound with antibacterial soap and water 3. Peri-wound Care: Moisturizing lotion 4. Dressing Applied: Hydrafera  Blue 5. Secondary Dressing Applied ABD Pad 7. Secured with 3 Layer Compression System - Right Lower Extremity Notes X sorb, unna to anchor Electronic Signature(s) Signed: 02/21/2018 11:54:50 AM By: Montey Hora Entered By: Montey Hora on 02/21/2018 08:39:12

## 2018-02-27 ENCOUNTER — Emergency Department: Payer: Medicare Other

## 2018-02-27 ENCOUNTER — Encounter: Payer: Self-pay | Admitting: Emergency Medicine

## 2018-02-27 ENCOUNTER — Other Ambulatory Visit: Payer: Self-pay

## 2018-02-27 ENCOUNTER — Emergency Department
Admission: EM | Admit: 2018-02-27 | Discharge: 2018-02-27 | Disposition: A | Discharge status: Home / Self Care | Payer: Medicare Other | Attending: Emergency Medicine | Admitting: Emergency Medicine

## 2018-02-27 DIAGNOSIS — E119 Type 2 diabetes mellitus without complications: Secondary | ICD-10-CM | POA: Insufficient documentation

## 2018-02-27 DIAGNOSIS — Z7984 Long term (current) use of oral hypoglycemic drugs: Secondary | ICD-10-CM | POA: Diagnosis not present

## 2018-02-27 DIAGNOSIS — F17210 Nicotine dependence, cigarettes, uncomplicated: Secondary | ICD-10-CM | POA: Diagnosis not present

## 2018-02-27 DIAGNOSIS — I10 Essential (primary) hypertension: Secondary | ICD-10-CM | POA: Diagnosis not present

## 2018-02-27 DIAGNOSIS — R109 Unspecified abdominal pain: Secondary | ICD-10-CM

## 2018-02-27 DIAGNOSIS — R1032 Left lower quadrant pain: Secondary | ICD-10-CM | POA: Insufficient documentation

## 2018-02-27 DIAGNOSIS — Z79899 Other long term (current) drug therapy: Secondary | ICD-10-CM | POA: Diagnosis not present

## 2018-02-27 DIAGNOSIS — Z7982 Long term (current) use of aspirin: Secondary | ICD-10-CM | POA: Diagnosis not present

## 2018-02-27 LAB — URINALYSIS, COMPLETE (UACMP) WITH MICROSCOPIC
BILIRUBIN URINE: NEGATIVE
Glucose, UA: NEGATIVE mg/dL
Hgb urine dipstick: NEGATIVE
KETONES UR: NEGATIVE mg/dL
LEUKOCYTES UA: NEGATIVE
Nitrite: NEGATIVE
PH: 5 (ref 5.0–8.0)
PROTEIN: 30 mg/dL — AB
Specific Gravity, Urine: 1.01 (ref 1.005–1.030)

## 2018-02-27 LAB — CBC
HCT: 36 % — ABNORMAL LOW (ref 39.0–52.0)
Hemoglobin: 11.4 g/dL — ABNORMAL LOW (ref 13.0–17.0)
MCH: 24.3 pg — ABNORMAL LOW (ref 26.0–34.0)
MCHC: 31.7 g/dL (ref 30.0–36.0)
MCV: 76.6 fL — AB (ref 80.0–100.0)
NRBC: 0 % (ref 0.0–0.2)
PLATELETS: 150 10*3/uL (ref 150–400)
RBC: 4.7 MIL/uL (ref 4.22–5.81)
RDW: 17.9 % — ABNORMAL HIGH (ref 11.5–15.5)
WBC: 3.5 10*3/uL — AB (ref 4.0–10.5)

## 2018-02-27 LAB — BASIC METABOLIC PANEL
ANION GAP: 7 (ref 5–15)
BUN: 14 mg/dL (ref 8–23)
CALCIUM: 8.5 mg/dL — AB (ref 8.9–10.3)
CO2: 27 mmol/L (ref 22–32)
Chloride: 104 mmol/L (ref 98–111)
Creatinine, Ser: 0.88 mg/dL (ref 0.61–1.24)
GFR calc Af Amer: >60 mL/min (ref >60)
GLUCOSE: 83 mg/dL (ref 70–99)
POTASSIUM: 3.5 mmol/L (ref 3.5–5.1)
SODIUM: 138 mmol/L (ref 135–145)

## 2018-02-27 MED ORDER — HYDROCODONE-ACETAMINOPHEN 5-325 MG PO TABS: 1.0000 | ORAL_TABLET | ORAL | 0 refills | Status: DC | PRN

## 2018-02-27 MED ORDER — HYDROMORPHONE HCL 1 MG/ML IJ SOLN
1.0000 mg | Freq: Once | INTRAMUSCULAR | Status: AC
  Administered 2018-02-27: 1 mg via INTRAMUSCULAR
  Filled 2018-02-27 (×2): qty 1

## 2018-02-27 NOTE — ED Notes (Signed)
Pt taken to the his POV via [place] where he was able to bear weight and ambulate to the POV with his wife. VSS. NAD. Discharge instructions, RX and follow up reviewed. All questions and concerns addressed.

## 2018-02-27 NOTE — ED Triage Notes (Signed)
Pt also reports that he had bright a little blood in his stool when he had a BM. States that it was bright red.

## 2018-02-27 NOTE — ED Provider Notes (Signed)
[facility] Emergency Department Provider Note  Time seen: 2:07 PM  I have reviewed the triage vital signs and the nursing notes.   HISTORY  Chief Complaint Flank Pain    HPI [name] is a 67 y.o. male with a past medical history of atrial fibrillation, gout, hypertension, CVA with speech difficulties, presents to the emergency department for left flank pain.  According to the patient for the past 2 to 3 days he has been experiencing a sharp pain in his left flank currently rates the pain as a 9/10.  Denies any nausea, vomiting, diarrhea.  Denies any chest pain or shortness of breath.  Denies a history of kidney stones.  No dysuria or hematuria.  States the pain is much worse with any type of movement.   Past Medical History:  Diagnosis Date  . Atrial fibrillation ([place])   . Cocaine abuse ([place])    + UDS on admission  . Gout current and history of  . History of deep vein thrombosis 2006  . History of tobacco abuse    has smoked for 50 years  . Hypertension   . Pulmonary embolism ([place])   . Stroke [facility])     Patient Active Problem List   Diagnosis Date Noted  . Arthritis 05/12/2017  . Varicose veins of leg with swelling, bilateral 01/18/2017  . Facial twitching   . Cerebral infarction ([place]) 10/03/2016  . 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 Pneumonia 10/12/2014  . Osteoarthritis of right knee 02/11/2014  . Pain and swelling of knee 01/08/2014  . Pain in extremity 01/08/2014  . Weakness due to cerebrovascular accident 01/08/2014  . Pulmonary embolism ([place]) 06/19/2012  . Gout 09/22/2011  . 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 CVA (cerebral infarction) 04/26/2011  . Diabetes mellitus ([place]) 04/26/2011  . 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 Anemia 04/26/2011  . DVT (deep venous thrombosis) ([place]) 04/26/2011  . Essential (primary) hypertension 04/26/2011    Past Surgical History:   Procedure Laterality Date  . VASCULAR SURGERY     Stent in leg    Prior to Admission medications   Medication Sig Start Date End Date Taking? Authorizing Provider  apixaban (ELIQUIS) 5 MG TABS tablet 10 mg twice daily for 7 days followed by 5 mg twice daily. 01/16/18   Earleen Newport, MD  aspirin 81 MG tablet Take 81 mg by mouth daily.    [provider]  carisoprodol (SOMA) 350 MG tablet Take 1 tablet (350 mg total) by mouth 3 (three) times daily as needed. 11/24/17   Schaevitz, Randall An, MD  Ferrous Sulfate (IRON) 28 MG TABS Take 1 tablet by mouth daily.    [provider]  furosemide (LASIX) 40 MG tablet Take 40 mg by mouth daily.  08/05/15   [provider]  guaiFENesin-dextromethorphan (ROBITUSSIN DM) 100-10 MG/5ML syrup Take 5 mLs by mouth every 4 (four) hours as needed for cough. 03/04/17   Fritzi Mandes, MD  HYDROcodone-acetaminophen (NORCO) 5-325 MG tablet Take 1 tablet by mouth every 4 (four) hours as needed for moderate pain. 07/18/16   Merlyn Lot, MD  ibuprofen (ADVIL,MOTRIN) 200 MG tablet Take 600 mg by mouth daily.     [provider]  lactulose (CHRONULAC) 10 GM/15ML solution Take 30 mLs (20 g total) by mouth daily as needed for mild constipation. 05/11/15   Paulette Blanch, MD  losartan (COZAAR) 100 MG tablet Take by mouth. 12/14/17  [provider]  losartan-hydrochlorothiazide Konrad Penta) 50-12.5 MG tablet Take by mouth. 02/09/18 02/09/19  [provider]  metFORMIN (GLUCOPHAGE) 500 MG tablet Take 500 mg by mouth 2 (two) times daily with a meal.  04/12/17   [provider]  metoprolol tartrate (LOPRESSOR) 25 MG tablet Take 25 mg by mouth 2 (two) times daily.    [provider]  nutrition supplement, JUVEN, (JUVEN) PACK Take 1 packet by mouth 2 (two) times daily between meals. 10/05/16   Demetrios Loll, MD  oxyCODONE-acetaminophen (PERCOCET) 7.5-325 MG tablet Take 1 tablet by mouth every 4 (four) hours as  needed for severe pain. 01/16/18 01/16/19  Earleen Newport, MD  polyethylene glycol [facility]) packet Take 17 g by mouth daily. 05/14/15   Loney Hering, MD  potassium chloride (K-DUR) 10 MEQ tablet Take by mouth. 02/09/18 02/09/19  [provider]  simvastatin (ZOCOR) 20 MG tablet Take 20 mg by mouth at bedtime. 02/07/18   [provider]    No Known Allergies  Family History  Problem Relation Age of Onset  . Aneurysm Mother   . Diabetes Father     Social History Social History   Tobacco Use  . Smoking status: Current Every Day Smoker    Packs/day: 0.50    Years: 50.00    Pack years: 25.00    Types: Cigarettes  . Smokeless tobacco: Never Used  Substance Use Topics  . Alcohol use: No  . Drug use: No    Comment: Urinainary drug screen + cocaine on admit    Review of Systems Constitutional: Negative for fever. Cardiovascular: Negative for chest pain. Respiratory: Negative for shortness of breath. Gastrointestinal: Left flank pain.  Negative for nausea vomiting or diarrhea.  Normal bowel movement yesterday. Genitourinary: Negative for urinary compaints, without dysuria or hematuria. Musculoskeletal: Negative for musculoskeletal complaints Skin: Negative for skin complaints  Neurological: Negative for headache All other ROS negative  ____________________________________________   PHYSICAL EXAM:  VITAL SIGNS: ED Triage Vitals  Enc Vitals Group     BP 02/27/18 1128 140/74     Pulse Rate 02/27/18 1128 63     Resp 02/27/18 1128 18     Temp 02/27/18 1128 97.8 F (36.6 C)     Temp Source 02/27/18 1128 Oral     SpO2 02/27/18 1128 98 %     Weight 02/27/18 1130 241 lb 3.2 oz (109.4 kg)     Height 02/27/18 1130 6\' 2"  (1.88 m)     Head Circumference --      Peak Flow --      Pain Score 02/27/18 1129 8     Pain Loc --      Pain Edu? --      Excl. in [place]? --    Constitutional: Alert and oriented. Well appearing and in no distress. Eyes: Normal  exam ENT   Head: Normocephalic and atraumatic.   Mouth/Throat: Mucous membranes are moist. Cardiovascular: Normal rate, regular rhythm. No murmur Respiratory: Normal respiratory effort without tachypnea nor retractions. Breath sounds are clear  Gastrointestinal: Soft and nontender. No distention.  Light left CVA tenderness to palpation. Musculoskeletal: Nontender with normal range of motion in all extremities.  2+ lower extremity edema, nontender, chronic per patient. Neurologic: Patient has occasional stuttering speech which she states is chronic due to CVA.  No other gross deficits identified on exam. Skin:  Skin is warm, dry  Psychiatric: Mood and affect are normal.   ____________________________________________    RADIOLOGY  IMPRESSION: 1. No urolithiasis. No hydronephrosis. 2. No evidence of bowel obstruction or acute bowel inflammation. 3. Nonspecific new mild bilateral inguinal lymphadenopathy. If there are clinical or laboratory findings concerning for a lymphoproliferative process, ultrasound-guided biopsy could be obtained. Otherwise, these nodes can be followed clinically and/or with follow-up CT abdomen/pelvis with oral and IV contrast in 3-6 months. 4. Cardiomegaly. 5. Mildly enlarged prostate. 6. Aortic Atherosclerosis (ICD10-I70.0).  ____________________________________________   INITIAL IMPRESSION / ASSESSMENT AND PLAN / ED COURSE  Pertinent labs & imaging results that were available during my care of the patient were reviewed by me and considered in my medical decision making (see chart for details).  She presents emergency department for sharp left flank pain ongoing for the past 2 to 3 days.  Differential would include ureterolithiasis, urinary tract infection, pyelonephritis, colitis, diverticulitis, muscular skeletal pain.  Lab work is largely at baseline, H&H is stable, patient white blood cell count of 3.5 which is chronic.  Chemistry is normal.   Urinalysis is largely normal as well.  We will obtain a CT renal scan to further evaluate.  We will dose pain medication and continue to closely monitor.  Patient agreeable to plan of care.   His labs are largely within normal limits.  Blood work and urinalysis did not show any acute concerning findings.  CT scan shows no acute findings however the patient does have nonspecific mild bilateral renal lymphadenopathy.  I discussed this with the patient need to follow-up with his doctor for repeat imaging in 3 to 6 months.  Patient agreeable to plan of care.  Overall the patient appears well with a reassuring work-up and reassuring vitals we will discharge the patient home with short course of pain medication have him follow-up with his doctor.  ____________________________________________   FINAL CLINICAL IMPRESSION(S) / ED DIAGNOSES  Left flank pain    Harvest Dark, MD 02/27/18 1500

## 2018-02-27 NOTE — ED Triage Notes (Signed)
Pt reports that he has had left flank pain for the last several days. Denies any blood in urine or trouble urinating. States that it hurts worse when he ambulates.

## 2018-02-28 ENCOUNTER — Other Ambulatory Visit: Payer: Self-pay

## 2018-02-28 ENCOUNTER — Encounter: Payer: Self-pay | Admitting: Emergency Medicine

## 2018-02-28 ENCOUNTER — Emergency Department
Admission: EM | Admit: 2018-02-28 | Discharge: 2018-02-28 | Disposition: A | Discharge status: Home / Self Care | Payer: Medicare Other | Attending: Emergency Medicine | Admitting: Emergency Medicine

## 2018-02-28 ENCOUNTER — Encounter: Payer: Medicare Other | Admitting: Physician Assistant

## 2018-02-28 DIAGNOSIS — E119 Type 2 diabetes mellitus without complications: Secondary | ICD-10-CM | POA: Diagnosis not present

## 2018-02-28 DIAGNOSIS — R109 Unspecified abdominal pain: Secondary | ICD-10-CM | POA: Diagnosis not present

## 2018-02-28 DIAGNOSIS — F17210 Nicotine dependence, cigarettes, uncomplicated: Secondary | ICD-10-CM | POA: Insufficient documentation

## 2018-02-28 DIAGNOSIS — I10 Essential (primary) hypertension: Secondary | ICD-10-CM | POA: Diagnosis not present

## 2018-02-28 DIAGNOSIS — E11622 Type 2 diabetes mellitus with other skin ulcer: Secondary | ICD-10-CM | POA: Diagnosis not present

## 2018-02-28 LAB — URINALYSIS, COMPLETE (UACMP) WITH MICROSCOPIC
BACTERIA UA: NONE SEEN
Bilirubin Urine: NEGATIVE
Glucose, UA: NEGATIVE mg/dL
Hgb urine dipstick: NEGATIVE
Ketones, ur: NEGATIVE mg/dL
Leukocytes, UA: NEGATIVE
Nitrite: NEGATIVE
PROTEIN: 30 mg/dL — AB
SPECIFIC GRAVITY, URINE: 1.006 (ref 1.005–1.030)
SQUAMOUS EPITHELIAL / LPF: NONE SEEN (ref 0–5)
WBC UA: NONE SEEN WBC/hpf (ref 0–5)
pH: 6 (ref 5.0–8.0)

## 2018-02-28 MED ORDER — DIAZEPAM 5 MG PO TABS: 5.0000 mg | ORAL_TABLET | Freq: Three times a day (TID) | ORAL | 0 refills | Status: AC | PRN

## 2018-02-28 MED ORDER — KETOROLAC TROMETHAMINE 60 MG/2ML IM SOLN
60.0000 mg | Freq: Once | INTRAMUSCULAR | Status: DC
  Filled 2018-02-28: qty 2

## 2018-02-28 MED ORDER — DIAZEPAM 5 MG PO TABS
5.0000 mg | ORAL_TABLET | Freq: Once | ORAL | Status: AC
  Administered 2018-02-28: 5 mg via ORAL
  Filled 2018-02-28: qty 1

## 2018-02-28 MED ORDER — HYDROMORPHONE HCL 1 MG/ML IJ SOLN
1.0000 mg | Freq: Once | INTRAMUSCULAR | Status: AC
  Administered 2018-02-28: 1 mg via INTRAMUSCULAR
  Filled 2018-02-28: qty 1
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
## 2018-02-28 NOTE — Discharge Instructions (Signed)
Please take Tylenol for mild to moderate pain.  If you develop muscle spasm that is severe, you may take Valium.  For severe pain that does not respond to Tylenol, you may take 1-2 hydrocodone tablets every 4 hours.  When you take Valium and hydrocodone, these medications can make you unsteady on your feet so please make sure you take all precautions to prevent falls.  You may not drive within 8 hours of taking these medications.  Return to the emergency department if you develop severe pain, numbness tingling or weakness, lightheadedness or fainting, fever, or any other symptoms concerning to you.

## 2018-02-28 NOTE — ED Triage Notes (Signed)
Pt to ED via POV with c/o LFT flank pain. Was seen in ED yesterday and given RX of hydrocodone. PT states no relief at home. HX of speech difficulties. NAD ntoe d

## 2018-03-02 ENCOUNTER — Encounter: Payer: Self-pay | Admitting: Emergency Medicine

## 2018-03-02 ENCOUNTER — Emergency Department
Admission: EM | Admit: 2018-03-02 | Discharge: 2018-03-02 | Disposition: A | Discharge status: Home / Self Care | Payer: Medicare Other | Attending: Emergency Medicine | Admitting: Emergency Medicine

## 2018-03-02 ENCOUNTER — Emergency Department: Payer: Medicare Other

## 2018-03-02 DIAGNOSIS — Z86718 Personal history of other venous thrombosis and embolism: Secondary | ICD-10-CM | POA: Insufficient documentation

## 2018-03-02 DIAGNOSIS — F17210 Nicotine dependence, cigarettes, uncomplicated: Secondary | ICD-10-CM | POA: Diagnosis not present

## 2018-03-02 DIAGNOSIS — I10 Essential (primary) hypertension: Secondary | ICD-10-CM | POA: Diagnosis not present

## 2018-03-02 DIAGNOSIS — N400 Enlarged prostate without lower urinary tract symptoms: Secondary | ICD-10-CM

## 2018-03-02 DIAGNOSIS — R1032 Left lower quadrant pain: Secondary | ICD-10-CM

## 2018-03-02 DIAGNOSIS — Z8673 Personal history of transient ischemic attack (TIA), and cerebral infarction without residual deficits: Secondary | ICD-10-CM | POA: Insufficient documentation

## 2018-03-02 DIAGNOSIS — Z7982 Long term (current) use of aspirin: Secondary | ICD-10-CM | POA: Insufficient documentation

## 2018-03-02 DIAGNOSIS — R590 Localized enlarged lymph nodes: Secondary | ICD-10-CM | POA: Insufficient documentation

## 2018-03-02 DIAGNOSIS — I4891 Unspecified atrial fibrillation: Secondary | ICD-10-CM | POA: Diagnosis not present

## 2018-03-02 DIAGNOSIS — Z7984 Long term (current) use of oral hypoglycemic drugs: Secondary | ICD-10-CM | POA: Diagnosis not present

## 2018-03-02 DIAGNOSIS — Z7901 Long term (current) use of anticoagulants: Secondary | ICD-10-CM | POA: Insufficient documentation

## 2018-03-02 DIAGNOSIS — R591 Generalized enlarged lymph nodes: Secondary | ICD-10-CM

## 2018-03-02 HISTORY — DX: Type 2 diabetes mellitus without complications: E11.9

## 2018-03-02 LAB — CBC WITH DIFFERENTIAL/PLATELET
Abs Immature Granulocytes: 0.01 10*3/uL (ref 0.00–0.07)
Basophils Absolute: 0 10*3/uL (ref 0.0–0.1)
Basophils Relative: 0 %
EOS ABS: 0.1 10*3/uL (ref 0.0–0.5)
EOS PCT: 4 %
HEMATOCRIT: 36.4 % — AB (ref 39.0–52.0)
Hemoglobin: 11.3 g/dL — ABNORMAL LOW (ref 13.0–17.0)
Immature Granulocytes: 0 %
LYMPHS ABS: 1.1 10*3/uL (ref 0.7–4.0)
Lymphocytes Relative: 32 %
MCH: 23.6 pg — AB (ref 26.0–34.0)
MCHC: 31 g/dL (ref 30.0–36.0)
MCV: 76.2 fL — AB (ref 80.0–100.0)
MONOS PCT: 10 %
Monocytes Absolute: 0.3 10*3/uL (ref 0.1–1.0)
Neutro Abs: 1.8 10*3/uL (ref 1.7–7.7)
Neutrophils Relative %: 54 %
PLATELETS: 141 10*3/uL — AB (ref 150–400)
RBC: 4.78 MIL/uL (ref 4.22–5.81)
RDW: 17.7 % — ABNORMAL HIGH (ref 11.5–15.5)
WBC: 3.3 10*3/uL — ABNORMAL LOW (ref 4.0–10.5)
nRBC: 0 % (ref 0.0–0.2)

## 2018-03-02 LAB — COMPREHENSIVE METABOLIC PANEL
ALBUMIN: 3.3 g/dL — AB (ref 3.5–5.0)
ALK PHOS: 65 U/L (ref 38–126)
ALT: 12 U/L (ref 0–44)
AST: 23 U/L (ref 15–41)
Anion gap: 6 (ref 5–15)
BILIRUBIN TOTAL: 0.6 mg/dL (ref 0.3–1.2)
BUN: 17 mg/dL (ref 8–23)
CALCIUM: 8.6 mg/dL — AB (ref 8.9–10.3)
CO2: 28 mmol/L (ref 22–32)
CREATININE: 0.81 mg/dL (ref 0.61–1.24)
Chloride: 105 mmol/L (ref 98–111)
GFR calc non Af Amer: >60 mL/min (ref >60)
GLUCOSE: 99 mg/dL (ref 70–99)
Potassium: 3.7 mmol/L (ref 3.5–5.1)
Sodium: 139 mmol/L (ref 135–145)
Total Protein: 6.5 g/dL (ref 6.5–8.1)

## 2018-03-02 LAB — TROPONIN I

## 2018-03-02 LAB — LIPASE, BLOOD: LIPASE: 18 U/L (ref 11–51)

## 2018-03-02 MED ORDER — IOPAMIDOL (ISOVUE-300) INJECTION 61%
100.0000 mL | Freq: Once | INTRAVENOUS | Status: AC | PRN
  Administered 2018-03-02: 100 mL via INTRAVENOUS

## 2018-03-02 MED ORDER — IOPAMIDOL (ISOVUE-300) INJECTION 61%
30.0000 mL | Freq: Once | INTRAVENOUS | Status: AC | PRN
  Administered 2018-03-02: 30 mL via ORAL

## 2018-03-02 NOTE — ED Triage Notes (Signed)
PT arrived with concerns over left lower side/abdomen pain. Pt was seen and sent home with a RX from [facility] but states inability to fill RX.

## 2018-03-02 NOTE — ED Notes (Signed)
Pt unable to sign due to signature malfnx due to signature pad malrfnx

## 2018-03-02 NOTE — ED Notes (Signed)
Pt verbalizes d/c understanding of d/c instructions, pt VSS, up to wheelchair, waiting on rid [employer]

## 2018-03-02 NOTE — ED Provider Notes (Signed)
[facility] Emergency Department Provider Note  ____________________________________________   I have reviewed the triage vital signs and the nursing notes. Where available I have reviewed prior notes and, if possible and indicated, outside hospital notes.    HISTORY  Chief Complaint Abdominal Pain    HPI [name] is a 67 y.o. male with history of atrial fibrillation cocaine use, history of DVT tobacco abuse CVA which makes it difficult for him to talk at baseline, presents today complaining of left lower quadrant abdominal pain which is been there for over a week.  He has been seen here twice for it.  Did have a noncontrasted CT scan for this 3 days ago which was negative.  He states he has had no vomiting or fever or other associated symptoms nothing makes it better nothing makes it worse but is very concerned because the pain seems to be persistently there and sometimes seems worse.  He denies any dysuria he denies any urinary frequency he denies any hematuria has a history of kidney stones, no kidney stone was seen on prior noncontrasted CT.  Nothing makes it better otherwise nothing makes it worse no other prior treatment.  He is upset that he has to keep coming back here for this.     Past Medical History:  Diagnosis Date  . Atrial fibrillation ([place])   . Cocaine abuse ([place])    + UDS on admission  . Gout current and history of  . History of deep vein thrombosis 2006  . History of tobacco abuse    has smoked for 50 years  . Hypertension   . Pulmonary embolism ([place])   . Stroke [facility])     Patient Active Problem List   Diagnosis Date Noted  . Arthritis 05/12/2017  . Varicose veins of leg with swelling, bilateral 01/18/2017  . Facial twitching   . Cerebral infarction ([place]) 10/03/2016  . 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 Pneumonia 10/12/2014  . Osteoarthritis of right knee 02/11/2014  . Pain and  swelling of knee 01/08/2014  . Pain in extremity 01/08/2014  . Weakness due to cerebrovascular accident 01/08/2014  . Pulmonary embolism ([place]) 06/19/2012  . Gout 09/22/2011  . 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 CVA (cerebral infarction) 04/26/2011  . Diabetes mellitus ([place]) 04/26/2011  . 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 Anemia 04/26/2011  . DVT (deep venous thrombosis) ([place]) 04/26/2011  . Essential (primary) hypertension 04/26/2011    Past Surgical History:  Procedure Laterality Date  . VASCULAR SURGERY     Stent in leg    Prior to Admission medications   Medication Sig Start Date End Date Taking? Authorizing Provider  apixaban (ELIQUIS) 5 MG TABS tablet 10 mg twice daily for 7 days followed by 5 mg twice daily. 01/16/18   Earleen Newport, MD  aspirin 81 MG tablet Take 81 mg by mouth daily.    [provider]  carisoprodol (SOMA) 350 MG tablet Take 1 tablet (350 mg total) by mouth 3 (three) times daily as needed. 11/24/17   Schaevitz, Randall An, MD  diazepam (VALIUM) 5 MG tablet Take 1 tablet (5 mg total) by mouth every 8 (eight) hours as needed for anxiety. 02/28/18 02/28/19  Eula Listen, MD  Ferrous Sulfate (IRON) 28 MG TABS Take 1 tablet by mouth daily.    [provider]  furosemide (LASIX) 40 MG tablet Take 40 mg by mouth daily.  08/05/15  [provider]  guaiFENesin-dextromethorphan (ROBITUSSIN DM) 100-10 MG/5ML syrup Take 5 mLs by mouth every 4 (four) hours as needed for cough. 03/04/17   Fritzi Mandes, MD  HYDROcodone-acetaminophen (NORCO/VICODIN) 5-325 MG tablet Take 1 tablet by mouth every 4 (four) hours as needed. 02/27/18   Harvest Dark, MD  ibuprofen (ADVIL,MOTRIN) 200 MG tablet Take 600 mg by mouth daily.     [provider]  lactulose (CHRONULAC) 10 GM/15ML solution Take 30 mLs (20 g total) by mouth daily as needed for mild constipation. 05/11/15   Paulette Blanch, MD  losartan (COZAAR) 100 MG  tablet Take by mouth. 12/14/17   [provider]  losartan-hydrochlorothiazide Konrad Penta) 50-12.5 MG tablet Take by mouth. 02/09/18 02/09/19  [provider]  metFORMIN (GLUCOPHAGE) 500 MG tablet Take 500 mg by mouth 2 (two) times daily with a meal.  04/12/17   [provider]  metoprolol tartrate (LOPRESSOR) 25 MG tablet Take 25 mg by mouth 2 (two) times daily.    [provider]  nutrition supplement, JUVEN, (JUVEN) PACK Take 1 packet by mouth 2 (two) times daily between meals. 10/05/16   Demetrios Loll, MD  oxyCODONE-acetaminophen (PERCOCET) 7.5-325 MG tablet Take 1 tablet by mouth every 4 (four) hours as needed for severe pain. 01/16/18 01/16/19  Earleen Newport, MD  polyethylene glycol [facility]) packet Take 17 g by mouth daily. 05/14/15   Loney Hering, MD  potassium chloride (K-DUR) 10 MEQ tablet Take by mouth. 02/09/18 02/09/19  [provider]  simvastatin (ZOCOR) 20 MG tablet Take 20 mg by mouth at bedtime. 02/07/18   [provider]    Allergies Patient has no known allergies.  Family History  Problem Relation Age of Onset  . Aneurysm Mother   . Diabetes Father     Social History Social History   Tobacco Use  . Smoking status: Current Every Day Smoker    Packs/day: 0.50    Years: 50.00    Pack years: 25.00    Types: Cigarettes  . Smokeless tobacco: Never Used  Substance Use Topics  . Alcohol use: No  . Drug use: No    Comment: [facility] drug screen + cocaine on admit    Review of Systems Constitutional: No fever/chills Eyes: No visual changes. ENT: No sore throat. No stiff neck no neck pain Cardiovascular: Denies chest pain. Respiratory: Denies shortness of breath. Gastrointestinal:   no vomiting.  No diarrhea.  No constipation. Genitourinary: Negative for dysuria. Musculoskeletal: Negative lower extremity swelling Skin: Negative for rash. Neurological: Negative for severe headaches, focal weakness or  numbness.   ____________________________________________   PHYSICAL EXAM:  VITAL SIGNS: ED Triage Vitals  Enc Vitals Group     BP 03/02/18 0924 (!) 172/95     Pulse Rate 03/02/18 0924 66     Resp 03/02/18 0924 20     Temp 03/02/18 0924 (!) 97.4 F (36.3 C)     Temp Source 03/02/18 0924 Oral     SpO2 03/02/18 0924 100 %     Weight 03/02/18 0923 235 lb (106.6 kg)     Height 03/02/18 0923 6\' 2"  (1.88 m)     Head Circumference --      Peak Flow --      Pain Score 03/02/18 0923 10     Pain Loc --      Pain Edu? --      Excl. in [place]? --     Constitutional: Alert and oriented. Well appearing and in  no acute distress. Eyes: Conjunctivae are normal Head: Atraumatic HEENT: No congestion/rhinnorhea. Mucous membranes are moist.  Oropharynx non-erythematous Neck:   Nontender with no meningismus, no masses, no stridor Cardiovascular: Normal rate, regular rhythm. Grossly normal heart sounds.  Good peripheral circulation. Respiratory: Normal respiratory effort.  No retractions. Lungs CTAB. Abdominal: Soft and positive mild distractible left lower quadrant tenderness. No distention. No guarding no rebound Back:  There is no focal tenderness or step off.  there is no midline tenderness there are no lesions noted. there is no CVA tenderness GU: Nontender, no testicular swelling or mass no scrotal mass Musculoskeletal: No lower extremity tenderness, no upper extremity tenderness. No joint effusions, no DVT signs strong distal pulses no edema Neurologic:  Normal speech and language. No gross focal neurologic deficits are appreciated.  Skin:  Skin is warm, dry and intact. No rash noted. Psychiatric: Mood and affect are normal. Speech and behavior are normal.  ____________________________________________   LABS (all labs ordered are listed, but only abnormal results are displayed)  Labs Reviewed  COMPREHENSIVE METABOLIC PANEL - Abnormal; Notable for the following components:      Result  Value   Calcium 8.6 (*)    Albumin 3.3 (*)    All other components within normal limits  CBC WITH DIFFERENTIAL/PLATELET - Abnormal; Notable for the following components:   WBC 3.3 (*)    Hemoglobin 11.3 (*)    HCT 36.4 (*)    MCV 76.2 (*)    MCH 23.6 (*)    RDW 17.7 (*)    Platelets 141 (*)    All other components within normal limits  LIPASE, BLOOD  TROPONIN I  URINALYSIS, COMPLETE (UACMP) WITH MICROSCOPIC    Pertinent labs  results that were available during my care of the patient were reviewed by me and considered in my medical decision making (see chart for details). ____________________________________________  EKG  I personally interpreted any EKGs ordered by me or triage Fibrillation, no acute ST elevation or depression ossific ST changes, borderline LAD ____________________________________________  RADIOLOGY  Pertinent labs & imaging results that were available during my care of the patient were reviewed by me and considered in my medical decision making (see chart for details). If possible, patient and/or family made aware of any abnormal findings.  No results found. ____________________________________________    PROCEDURES  Procedure(s) performed: None  Procedures  Critical Care performed: None  ____________________________________________   INITIAL IMPRESSION / ASSESSMENT AND PLAN / ED COURSE  Pertinent labs & imaging results that were available during my care of the patient were reviewed by me and considered in my medical decision making (see chart for details).  She continually complaining of lower abdominal discomfort, do not have a clear etiology for it is at exam does not show any evidence of herniation.  Could certainly be an indolent diverticulitis that was not picked up on a noncontrasted CT scan, the rest of his work-up is however quite reassuring we will obtain an a CT scan with contrast this time to ensure that there is no other pathology we  will monitor him closely here.  Patient is not requesting pain meds he is relaxing in the bed in no acute distress    ____________________________________________   FINAL CLINICAL IMPRESSION(S) / ED DIAGNOSES  Final diagnoses:  None      This chart was dictated using voice recognition software.  Despite best efforts to proofread,  errors can occur which can change meaning.      Mc[place]ane,  Gerda Diss, MD 03/02/18 1343

## 2018-03-03 NOTE — Progress Notes (Signed)
JAYDE, DAFFIN ([id number]) Visit Report for 02/28/2018 Arrival Information Details Patient Name: CHARLTON, BOULE Date of Service: 02/28/2018 8:00 AM Medical Record Number: [id number] Patient Account Number: [id number] Date of Birth/Sex: 1950/10/30 (67 y.o. M) Treating RN: Cornell Barman Primary Care Jasun Gasparini: Royetta Crochet Other Clinician: Referring Kemyah Buser: Royetta Crochet Treating Burrel Legrand/Extender: Melburn Hake, HOYT Weeks in Treatment: 18 Visit Information History Since Last Visit Added or deleted any medications: No Patient Arrived: Ambulatory Any new allergies or adverse reactions: No Arrival Time: 08:05 Had a fall or experienced change in No Accompanied By: self activities of daily living that may affect Transfer Assistance: None risk of falls: Patient Identification Verified: Yes Signs or symptoms of abuse/neglect since last visito No Secondary Verification Process Yes Hospitalized since last visit: No Completed: Implantable device outside of the clinic excluding No Patient Requires Transmission-Based No cellular tissue based products placed in the center Precautions: since last visit: Patient Has Alerts: Yes Has Dressing in Place as Prescribed: Yes Patient Alerts: Patient on Blood Pain Present Now: Yes Thinner DMII warfarin Electronic Signature(s) Signed: 03/01/2018 5:17:02 PM By: Gretta Cool, [name], RN, CWS, [name], [name] Entered By: Gretta Cool, [name], RN, CWS, Kim on 02/28/2018 08:09:54 [name] ([id number]) -------------------------------------------------------------------------------- Encounter Discharge Information Details Patient Name: [name] Date of Service: 02/28/2018 8:00 AM Medical Record Number: [id number] Patient Account Number: [id number] Date of Birth/Sex: 08/05/1950 (67 y.o. M) Treating RN: Cornell Barman Primary Care Johnell Landowski: Royetta Crochet Other Clinician: Referring Jessicca Stitzer: Royetta Crochet Treating Annaliesa Blann/Extender: Melburn Hake,  HOYT Weeks in Treatment: 18 Encounter Discharge Information Items Discharge Condition: Stable Ambulatory Status: Ambulatory Discharge Destination: Home Transportation: Private Auto Accompanied By: self Schedule Follow-up Appointment: Yes Clinical Summary of Care: Post Procedure Vitals: Temperature (F): 97.9 Pulse (bpm): 72 Respiratory Rate (breaths/min): 16 Blood Pressure (mmHg): 136/91 Electronic Signature(s) Signed: 03/01/2018 5:17:02 PM By: Gretta Cool, [name], RN, CWS, [name], [name] Entered By: Gretta Cool, [name], RN, CWS, Kim on 02/28/2018 08:42:44 Kama, [name] -------------------------------------------------------------------------------- Lower Extremity Assessment Details Patient Name: [name] Date of Service: 02/28/2018 8:00 AM Medical Record Number: [id number] Patient Account Number: [id number] Date of Birth/Sex: [birth year] (67 y.o. M) Treating RN: Cornell Barman Primary Care Mckinsey Keagle: Royetta Crochet Other Clinician: Referring Habib Kise: Royetta Crochet Treating Twylah Bennetts/Extender: Melburn Hake, HOYT Weeks in Treatment: 18 Edema Assessment Assessed: [Left: No] [Right: No] E[Left: dema] [Right: :] Calf Left: Right: Point of Measurement: 38 cm From Medial Instep cm 42 cm Ankle Left: Right: Point of Measurement: 14 cm From Medial Instep cm 30.5 cm Vascular Assessment Claudication: Claudication Assessment [Right:None] Pulses: Dorsalis Pedis Palpable: [Right:Yes] Posterior Tibial Extremity colors, hair growth, and conditions: Extremity Color: [Right:Hyperpigmented] Hair Growth on Extremity: [Right:Yes] Temperature of Extremity: [Right:Warm] Capillary Refill: [Right:< 3 seconds] Toe Nail Assessment Left: Right: Thick: Yes Discolored: Yes Deformed: Yes Improper Length and Hygiene: No Electronic Signature(s) Signed: 03/01/2018 5:17:02 PM By: Gretta Cool, [name], RN, CWS, [name], [name] Entered By: Gretta Cool, [name], RN, CWS, Kim on 02/28/2018 08:19:25 Seher, [name]  ([id number]) -------------------------------------------------------------------------------- Multi Wound Chart Details Patient Name: [name] Date of Service: 02/28/2018 8:00 AM Medical Record Number: [id number] Patient Account Number: [id number] Date of Birth/Sex: [birth year] (67 y.o. 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] [N/A:N/A] Secondary Etiology: [8:Lymphedema] [N/A:N/A] Comorbid History: [8:Chronic Obstructive Pulmonary Disease (COPD), Arrhythmia, Deep Vein Thrombosis, Hypertension, Type II Diabetes, Gout, Neuropathy] [N/A:N/A] Date Acquired: [8:10/17/2017] [N/A:N/A] Weeks of Treatment: [8:18] [N/A:N/A] Wound Status: [8:Open] [N/A:N/A] Measurements L x W x D [8:4x4x0.2] [N/A:N/A] (cm) Area (cm) : [8:12.566] [N/A:N/A] Volume (cm) : [8:2.513] [N/A:N/A] % Reduction in Area: [8:-7515.80%] [N/A:N/A] % Reduction in Volume: [8:-15606.20%] [N/A:N/A] Classification: [8:Grade 2] [N/A:N/A] Exudate Amount: [8:Large] [N/A:N/A] Exudate Type: [8:Serous] [N/A:N/A] Exudate Color: [8:amber] [N/A:N/A] Wound Margin: [8:Flat and Intact] [N/A:N/A] Granulation Amount: [8:Large (67-100%)] [N/A:N/A] Granulation Quality: [8:Red, Pink] [N/A:N/A] Necrotic Amount: [8:Small (1-33%)] [N/A:N/A] Exposed Structures: [8:Fat Layer (Subcutaneous Tissue) Exposed: Yes Fascia: No Tendon: No Muscle: No Joint: No Bone: No] [N/A:N/A] Epithelialization: Small (1-33%) N/A  N/A Periwound Skin Texture: Excoriation: No N/A N/A Induration: No Callus: No Crepitus: No Rash: No Scarring: No Periwound Skin Moisture: Maceration: No N/A N/A Dry/Scaly: No Periwound Skin Color: Hemosiderin Staining: Yes N/A N/A Atrophie Blanche: No Cyanosis: No Ecchymosis: No Erythema: No Mottled: No Pallor: No Rubor: No Temperature: No Abnormality N/A N/A Tenderness on Palpation: Yes N/A N/A Wound Preparation: Ulcer Cleansing: N/A N/A Rinsed/Irrigated with Saline, Other: soap and water Topical Anesthetic Applied: None, Other: lidocaine 4% Treatment Notes Electronic Signature(s) Signed: 03/01/2018 5:17:02 PM By: Gretta Cool, [name], RN, CWS, [name], [name] Entered By: Gretta Cool, [name], RN, CWS, Kim on 02/28/2018 08:30:26 Delgadillo, [name] ([id number]) -------------------------------------------------------------------------------- Multi-Disciplinary Care Plan Details Patient Name: [name] Date of Service: 02/28/2018 8:00 AM Medical Record Number: [id number] Patient Account Number: [id number] Date of Birth/Sex: 16-Aug-1950 (67 y.o. M) Treating RN: Cornell Barman Primary Care Terrian Sentell: Royetta Crochet Other Clinician: Referring Doyel Mulkern: Royetta Crochet Treating Jawana Reagor/Extender: Melburn Hake, HOYT Weeks in Treatment: 18 Active Inactive ` Abuse / Safety / Falls / Self Care Management Nursing Diagnoses: Potential for falls Goals: Patient will not experience any injury related to falls Date Initiated: 10/25/2017 Target Resolution Date: 02/18/2018 Goal Status: Active Interventions: Assess Activities of Daily Living upon admission and as needed Assess fall risk on admission and as needed Assess: immobility, friction, shearing, incontinence upon admission and as needed Assess impairment of mobility on admission and as needed per policy Assess personal safety and home safety (as indicated) on admission and as needed Assess self care needs on admission and as  needed Notes: ` Nutrition Nursing Diagnoses: Imbalanced nutrition Impaired glucose control: actual or potential Potential for alteratiion in Nutrition/Potential for imbalanced nutrition Goals: Patient/caregiver agrees to and verbalizes understanding of need to use nutritional supplements and/or vitamins as prescribed Date Initiated: 10/25/2017 Target Resolution Date: 02/18/2018 Goal Status: Active Patient/caregiver will maintain therapeutic glucose control Date Initiated: 10/25/2017 Target Resolution Date: 02/18/2018 Goal Status: Active Interventions: Assess patient nutrition upon admission and as needed per policy Provide education on elevated blood sugars and impact on wound healing Provide education on nutrition ANVITH, MAURIELLO ([id number]) Notes: ` Orientation to the Wound Care Program Nursing Diagnoses: Knowledge deficit related to the wound healing center program Goals: Patient/caregiver will verbalize understanding of the [facility] Date Initiated: 10/25/2017 Target Resolution Date: 11/19/2017 Goal Status: Active Interventions: Provide education on orientation to the wound center Notes: ` Wound/Skin Impairment Nursing Diagnoses: Impaired tissue integrity Knowledge deficit related to ulceration/compromised skin integrity Goals: Ulcer/skin breakdown will have a volume reduction of 80% by week 12 Date Initiated: 10/25/2017 Target Resolution Date: 02/11/2018 Goal Status: Active Interventions: Assess patient/caregiver ability to perform ulcer/skin care regimen upon admission and as needed Assess ulceration(s) every visit Notes: Electronic Signature(s) Signed:  03/01/2018 5:17:02 PM By: Gretta Cool, [name], RN, CWS, [name], [name] Entered By: Gretta Cool, [name], RN, CWS, Kim on 02/28/2018 08:30:09 Golab, [name] ([id number]) -------------------------------------------------------------------------------- Pain Assessment Details Patient Name: [name] Date of Service: 02/28/2018 8:00 AM Medical Record Number: [id number] Patient Account Number: [id number] Date of Birth/Sex: [birth year] (67 y.o. M) Treating RN: Cornell Barman Primary Care Quamir Willemsen: Royetta Crochet Other Clinician: Referring Judia Arnott: Royetta Crochet Treating Nonie Lochner/Extender: Melburn Hake, HOYT Weeks in Treatment: 18 Active Problems Location of Pain Severity and Description of Pain Patient Has Paino Yes Site Locations Rate the pain. Current Pain Level: 8 Character of Pain Describe the Pain: Sharp, Shooting Pain Management and Medication Current Pain Management: Electronic Signature(s) Signed: 03/01/2018 5:17:02 PM By: Gretta Cool, [name], RN, CWS, [name], [name] Entered By: Gretta Cool, [name], RN, CWS, Kim on 02/28/2018 08:10:06 Leet, [name] ([id number]) -------------------------------------------------------------------------------- Patient/Caregiver Education Details Patient Name: [name] Date of Service: 02/28/2018 8:00 AM Medical Record Number: [id number] Patient Account Number: [id number] Date of Birth/Gender: 10/17/1950 (67 y.o. M) Treating RN: Cornell Barman Primary Care Physician: Royetta Crochet Other Clinician: Referring Physician: Royetta Crochet Treating Physician/Extender: Sharalyn Ink in Treatment: 40 Education Assessment Education Provided To: Patient Education Topics Provided Venous: Handouts: Controlling Swelling with Multilayered Compression Wraps, Other: Do not get dressing wet Methods: Demonstration, Explain/Verbal Electronic Signature(s) Signed: 03/01/2018 5:17:02 PM By: Gretta Cool, [name], RN, CWS, [name], [name] Entered By: Gretta Cool, [name], RN, CWS, Kim on 02/28/2018 08:41:18 [name], [name] ([id number]) -------------------------------------------------------------------------------- Wound Assessment Details Patient Name: [name] Date of Service: 02/28/2018 8:00 AM Medical Record Number: [id number] Patient Account Number:  [id number] Date of Birth/Sex: 11-10-1950 (67 y.o. M) Treating RN: Cornell Barman Primary Care Shaeley Segall: Royetta Crochet Other Clinician: Referring Rozalynn Buege: Royetta Crochet Treating Neriah Brott/Extender: STONE III, HOYT Weeks in Treatment: 18 Wound Status Wound Number: 8 Primary Diabetic Wound/Ulcer of the Lower Extremity Etiology: Wound Location: Right Lower Leg - Medial, Proximal Secondary Lymphedema Wounding Event: Gradually Appeared Etiology: Date Acquired: 10/17/2017 Wound Open Weeks Of Treatment: 18 Status: Clustered Wound: No Comorbid Chronic Obstructive Pulmonary Disease History: (COPD), Arrhythmia, Deep Vein Thrombosis, Hypertension, Type II Diabetes, Gout, Neuropathy Photos Photo Uploaded By: Gretta Cool, [name], RN, CWS, Kim on 02/28/2018 15:40:20 Wound Measurements Length: (cm) 4 Width: (cm) 4 Depth: (cm) 0.2 Area: (cm) 12.566 Volume: (cm) 2.513 % Reduction in Area: -7515.8% % Reduction in Volume: -15606.2% Epithelialization: Small (1-33%) Tunneling: No Undermining: No Wound Description Classification: Grade 2 Foul Odo Wound Margin: Flat and Intact Slough/F Exudate Amount: Large Exudate Type: Serous Exudate Color: amber r After Cleansing: No ibrino Yes Wound Bed Granulation Amount: Large (67-100%) Exposed Structure Granulation Quality: Red, Pink Fascia Exposed: No Necrotic Amount: Small (1-33%) Fat Layer (Subcutaneous Tissue) Exposed: Yes Necrotic Quality: Adherent Slough Tendon Exposed: No Muscle Exposed: No Joint Exposed: No Stout, Derron ([id number]) Bone Exposed: No Periwound Skin Texture Texture Color No Abnormalities Noted: No No Abnormalities Noted: No Callus: No Atrophie Blanche: No Crepitus: No Cyanosis: No Excoriation: No Ecchymosis: No Induration: No Erythema: No Rash: No Hemosiderin Staining: Yes Scarring: No Mottled: No Pallor: No Moisture Rubor: No No Abnormalities Noted: No Dry / Scaly: No Temperature / Pain Maceration:  No Temperature: No Abnormality Tenderness on Palpation: Yes Wound Preparation Ulcer Cleansing: Rinsed/Irrigated with Saline, Other: soap and water, Topical Anesthetic Applied: None, Other: lidocaine 4%, Treatment Notes Wound #8 (Right, Proximal, Medial Lower Leg) 1. Cleansed with: Cleanse wound with antibacterial soap and water 2. Anesthetic Topical Lidocaine 4% cream to wound bed prior to debridement 3. Peri-wound Care: Moisturizing lotion 4. Dressing  Applied: Hydrafera Blue 7. Secured with 3 Layer Compression System - Left Lower Extremity Notes X sorb, unna to Engineer, production) Signed: 03/01/2018 5:17:02 PM By: Gretta Cool, [name], RN, CWS, [name], [name] Entered By: Gretta Cool, [name], RN, CWS, Kim on 02/28/2018 08:15:35 Alwine, [name] ([id number]) -------------------------------------------------------------------------------- Vitals Details Patient Name: [name] Date of Service: 02/28/2018 8:00 AM Medical Record Number: [id number] Patient Account Number: [id number] Date of Birth/Sex: 20-May-1950 (67 y.o. M) Treating RN: Cornell Barman Primary Care Georgena Weisheit: Royetta Crochet Other Clinician: Referring Dayelin Balducci: Royetta Crochet Treating Viviana Trimble/Extender: Melburn Hake, HOYT Weeks in Treatment: 18 Vital Signs Time Taken: 08:10 Temperature (F): 97.9 Height (in): 74 Pulse (bpm): 72 Weight (lbs): 233.6 Respiratory Rate (breaths/min): 18 Body Mass Index (BMI): 30 Blood Pressure (mmHg): 136/91 Reference Range: 80 - 120 mg / dl Electronic Signature(s) Signed: 03/01/2018 5:17:02 PM By: Gretta Cool, [name], RN, CWS, [name], [name] Entered By: Gretta Cool, [name], RN, CWS, Kim on 02/28/2018 08:14:08

## 2018-03-03 NOTE — Progress Notes (Addendum)
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 level. Successful ablation of the right small saphenous vein. He had unsuccessful ablation of the right greater saphenous vein from the saphenofemoral junction to the distal 5 level. The patient wears to let her compression stockings and he  has been compliant with this With regards to his diabetes he is not currently on any treatment. He does describe pain in his leg which forces him to stop although I couldn't really quantify this. His last arterial studies were on 07/05/16 which showed triphasic waveforms on the right to the level of the popliteal artery. The right posterior tibial and anterior tibial artery were not visualized on this study. I don't actually see ABIs or TBIs. 05/04/17; the patient has small wounds on the right medial heel and the right medial leg. The area on the foot is just about closed. The area on the right medial leg is improved. He is tolerating the 3 layer compression without any complaints. The patient has both known PAD and chronic venous reflux [see discussion above]. 05/11/17; the areas on the patient's medial right heel with healed he still has an open area on the right medial leg. This does not require debridement today I think I did read it last week. We are using silver collagen under 3 layer compression. 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 He has been tolerating the dressing changes without complication. With that being said he does have some epithelialization growing into the wound bed and it appears this wound may be trying to healed in a depressed fashion. To be honest that is not the worst case scenario and I think this may definitely do fine even if it heals in that way. There was no requirement for debridement today as patient appears to be doing very well in regard to his ulcer. His swelling was a little bit more severe than last week but nothing too significant. 07/05/17 on evaluation today patient appears to be doing rather well in regard to his right medial lower extremity ulcer. The wound does appear to be healing  although it is healing and somewhat of a cratered fashion the good news is he has excellent epithelium noted. Fortunately there does not appear to be any evidence of infection at this point he is tolerating the dressings as well is the wrap well. 07/12/17 on evaluation today patient appears to be doing very well in regard to his left medial lower extremity ulcer. He has been tolerating the dressing changes without complication. In fact compared to last week this wound which was somewhat concave has fielding dramatically and looks to be doing excellent. I'm extremely happy with the progress. He still has pain rated to be a 4-5/10 but the wound looks great. 07/20/17; the patient's right medial lower extremity ulcer is closed. He has good edema control. Unfortunately he does not really have adequate compression stockings. Certainly what he brought in the blood on his right leg  might of been support hose however there is whole in the stocking already Readmission: 10/25/17 patient seen today for reevaluation due to a recurrence of the right medial ankle venous leg ulcer. 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 He has been treated with compression stockings for some time now in fact we have notes having treated him even as far back as 2016 here in our clinic. Fortunately he has excellent blood flow and tends to heal well. Unfortunately despite wearing his compression stockings, keeping his legs elevated, being on fluid pills (Lasix) and doing everything he can to try to help with fluid he still continues to have issues with this reopening. I think that he may be a good candidate for compression/lymphedema pumps. We have never attempted to get these for him previously. No fevers, chills, nausea, or vomiting noted at this time. Currently patient states his ulcer does hurt although not as bad as some in the past it's also not as large as  it has been in the past when it reopened he talked is somewhat early. Obviously this is good news. TERAN, KNITTLE ([id number]) 12/13/17 on evaluation today patient appears to actually be doing very well in regard to his right medial ankle ulcer compared to last week's evaluation. I think having him on the correct antibiotics has made all the difference in the world. With that being said he's not having discomfort like he was previous and in general I feel like he has made excellent strides towards healing in the past week. No fevers, chills, nausea, or vomiting noted at this time. 01/10/18 on evaluation today patient appears to be doing a little worse compared to last week's evaluation. I think that though the Iodoflex has help with cleaning up the surface of the wound subsequently he has having a lot of drainage which I think is causing some issues in that regard. At this point I think we may want to switch the dressings. 01/17/18 on evaluation today patient appears to be doing rather well in regard to his right medial malleolus ulcer. The good news is I do feel like he showing some signs of improvement which is excellent. I feel like that the [facility] Dressing been better for him than the Iodoflex that we will previously utilizing. He is having much less in the way of discomfort which is good news. Overall very pleased in that regard. Nonetheless the appearance of the wound is improved today as well and that is excellent news. He still has pain but not seemingly as much as previous and I don't feel like there's much maceration either. 01/23/18 on evaluation today patient appears in our clinic a day early for his appointment due to the fact that he was having discomfort and issues with his wrap. Upon inspection of the wound actually appears to be doing fairly well which is good news. With that being said he is having a lot of discomfort at this point unfortunately. I think that this is mainly  due to the fact that he is having such drainage as the wound bed itself seems to be doing well. I do believe the [facility] Dressing well is helpful for him which is good news. Nonetheless again I think he may need more frequent dressing changes. 01/31/18 on evaluation today patient actually appears to be doing very well in regard to his right medial ankle ulcer. This is significantly better than what was even noted  last week I do think that the extra dressing change with the nurse visit toward the end of the week made a significant improvement for him. Overall I'm very pleased with how things appear today. 02/07/18 on evaluation today patient actually appears to be doing a little better in regard to his wound. The measurements are smaller today compared to previous. Fortunately there's no evidence of any infection and I do believe the moisture is much better control with more frequent wrap changes. Overall the patient seems to be making good progress. 02/14/18 on evaluation today patient actually appears to be doing very well in regard to his medial ankle ulcer of the right lower extremity. With that being said he saw slough buildup and this is still been somewhat slow to heal. For that reason my suggestion currently is gonna be that we may need to see about checking on a skin substitute possibly Apligraf to see if this will be of benefit for him. In the past he has benefited from Apligraf. Overall there does not appear to be any evidence of infection at this time which is definitely good news. 02/28/18 on evaluation today patient's medial ankle ulcer on the right actually appears to be doing significantly better compared to previous. He has been tolerating the dressing changes without complication. Fortunately there appears to be no evidence of infection which is great news. Overall I do feel like that he has made improvement although there has been some delay in general and the wound is taken a  little longer than I would've liked up to this point. Nonetheless there's no sign of infection and therefore I think that we can proceed with the Apligraf applications we actually got this approved for him. That something that I'm gonna see about ordering for next week. Patient History Information obtained from Patient. Family History Heart Disease - Mother,Father, Hypertension - Mother,Father, Stroke - Father, No family history of Cancer, Diabetes, Hereditary Spherocytosis, Thyroid Problems, Tuberculosis. Social History Current every day smoker, Marital Status - Single, Alcohol Use - Never, Drug Use - No History, Caffeine Use - Daily. Medical History Hospitalization/Surgery History - 05/17/2012, [facility], CVA. - 03/17/2017, [facility], Pneumonia. Medical And Surgical History Notes Ear/Nose/Mouth/Throat RALPHEAL, ZAPPONE ([id number]) difficulty speaking Respiratory Hospitalized for Pneumonia 02/2017 Neurologic CVA in 2014 Review of Systems (ROS) Constitutional Symptoms (General Health) Denies complaints or symptoms of Fever, Chills. Respiratory The patient has no complaints or symptoms. Cardiovascular Complains or has symptoms of LE edema. Psychiatric The patient has no complaints or symptoms. Objective Constitutional Well-nourished and well-hydrated in no acute distress. Vitals Time Taken: 8:10 AM, Height: 74 in, Weight: 233.6 lbs, BMI: 30, Temperature: 97.9 F, Pulse: 72 bpm, Respiratory Rate: 18 breaths/min, Blood Pressure: 136/91 mmHg. Respiratory normal breathing without difficulty. Psychiatric this patient is able to make decisions and demonstrates good insight into disease process. Alert and Oriented x 3. pleasant and cooperative. General Notes: Patient's wound bed actually shows evidence of slough covering the surface of the wound although not as significant as previously noted. This was sharply debrided today which the patient tolerated without complication post debridement the  wound bed appears to be doing better. Integumentary (Hair, Skin) Wound #8 status is Open. Original cause of wound was Gradually Appeared. The wound is located on the Right,Proximal,Medial Lower Leg. The wound measures 4cm length x 4cm width x 0.2cm depth; 12.566cm^2 area and 2.513cm^3 volume. There is Fat Layer (Subcutaneous Tissue) Exposed exposed. There is no tunneling or undermining noted. There is a large amount of  serous drainage noted. The wound margin is flat and intact. There is large (67-100%) red, pink granulation within the wound bed. There is a small (1-33%) amount of necrotic tissue within the wound bed including Adherent Slough. The periwound skin appearance exhibited: Hemosiderin Staining. The periwound skin appearance did not exhibit: Callus, Crepitus, Excoriation, Induration, Rash, Scarring, Dry/Scaly, Maceration, Atrophie Blanche, Cyanosis, Ecchymosis, Mottled, Pallor, Rubor, Erythema. Periwound temperature was noted as No Abnormality. The periwound has tenderness on palpation. 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 There was a Excisional Skin/Subcutaneous Tissue Debridement with a total area of 8 sq cm performed by STONE III, Leonardo Plaia E., PA-C. With the following instrument(s): Curette to remove Viable and Non-Viable tissue/material. Material removed includes Subcutaneous Tissue and Slough and after achieving pain control using Other (lidocaine 4%). A time out was conducted at 08:30, prior to the start of the procedure. A Minimum  amount of bleeding was controlled with Pressure. The procedure was tolerated well. Post Debridement Measurements: 4cm length x 4cm width x 0.2cm depth; 2.513cm^3 volume. Character of Wound/Ulcer Post Debridement requires further debridement. Severity of Tissue Post Debridement is: Fat layer exposed. Post procedure Diagnosis Wound #8: Same as Pre-Procedure Plan Wound Cleansing: Wound #8 Right,Proximal,Medial Lower Leg: Clean wound with Normal Saline. Cleanse wound with mild soap and water Anesthetic (add to Medication List): Wound #8 Right,Proximal,Medial Lower Leg: Topical Lidocaine 4% cream applied to wound bed prior to debridement (In Clinic Only). Skin Barriers/Peri-Wound Care: Wound #8 Right,Proximal,Medial Lower Leg: Moisturizing lotion - not on wound area Primary Wound Dressing: Wound #8 Right,Proximal,Medial Lower Leg: Hydrafera Blue Ready Transfer Secondary Dressing: Wound #8 Right,Proximal,Medial Lower Leg: Other - Kerramax Dressing Change Frequency: Wound #8 Right,Proximal,Medial Lower Leg: Change dressing every week Follow-up Appointments: Wound #8 Right,Proximal,Medial Lower Leg: Return Appointment in 1 week. Lank, [name] ([id number]) Nurse Visit as needed Edema Control: Wound #8 Right,Proximal,Medial Lower Leg: 3 Layer Compression System - Right Lower Extremity - unna to anchor Other: - order bilateral compression pumps Additional Orders / Instructions: Wound #8 Right,Proximal,Medial Lower Leg: Increase protein intake. I'm gonna suggest currently that we continue with the above wound care measures for the time being. The patient is in agreement with that plan. The more frequent dressing changes in regard to his wrap have been beneficial for him. Subsequently I'm gonna see him back for reevaluation in one weeks time to see were things stand. Please see above for specific wound care orders. We will see patient for re-evaluation in 1 week(s) here in the clinic.  If anything worsens or changes patient will contact our office for additional recommendations. Electronic Signature(s) Signed: 03/02/2018 5:08:41 PM By: Worthy Keeler PA-C Entered By: Worthy Keeler on 03/02/2018 10:12:05 Altergott, [name] ([id number]) -------------------------------------------------------------------------------- ROS/PFSH Details Patient Name: [name] Date of Service: 02/28/2018 8:00 AM Medical Record Number: [id number] Patient Account Number: [id number] Date of Birth/Sex: 11-08-1950 (67 y.o. M) Treating RN: Montey Hora Primary Care Provider: Royetta Crochet Other Clinician: Referring Provider: Royetta Crochet Treating Provider/Extender: Melburn Hake, Eknoor Novack Weeks in Treatment: 18 Information Obtained From Patient Wound History Do you currently have one or more open woundso Yes How many open wounds do  you currently haveo 1 Approximately how long have you had your woundso 1 week How have you been treating your wound(s) until nowo lotion Has your wound(s) ever healed and then re-openedo Yes Have you had any lab work done in the past montho No Have you tested positive for an antibiotic resistant organism (MRSA, VRE)o No Have you tested positive for osteomyelitis (bone infection)o No Have you had any tests for circulation on your legso Yes Who ordered the testo [facility] Where was the test doneo [facility] Constitutional Symptoms (General Health) Complaints and Symptoms: Negative for: Fever; Chills Cardiovascular Complaints and Symptoms: Positive for: LE edema Medical History: Positive for: Arrhythmia - a fib; Deep Vein Thrombosis; Hypertension Negative for: Angina; Congestive Heart Failure; Coronary Artery Disease; Hypotension; Myocardial Infarction; Peripheral Arterial Disease; Peripheral Venous Disease; Phlebitis; Vasculitis Eyes Medical History: Negative for: Cataracts; Glaucoma; Optic Neuritis Ear/Nose/Mouth/Throat Medical History: Negative for:  Chronic sinus problems/congestion; Middle ear problems Past Medical History Notes: difficulty speaking Hematologic/Lymphatic Medical History: Negative for: Anemia; Hemophilia; Human Immunodeficiency Virus; Lymphedema; Sickle Cell Disease Respiratory Kleeman, Omer ([id number]) Complaints and Symptoms: No Complaints or Symptoms Medical History: Positive for: Chronic Obstructive Pulmonary Disease (COPD) Negative for: Aspiration; Asthma; Pneumothorax; Sleep Apnea; Tuberculosis Past Medical History Notes: Hospitalized for Pneumonia 02/2017 Gastrointestinal Medical History: Negative for: Cirrhosis ; Colitis; Crohnos; Hepatitis A; Hepatitis B; Hepatitis C Endocrine Medical History: Positive for: Type II Diabetes Negative for: Type I Diabetes Time with diabetes: since 2017 Treated with: Diet Blood sugar tested every day: No Genitourinary Medical History: Negative for: End Stage Renal Disease Immunological Medical History: Negative for: Lupus Erythematosus; Raynaudos; Scleroderma Integumentary (Skin) Medical History: Negative for: History of Burn; History of pressure wounds Musculoskeletal Medical History: Positive for: Gout Negative for: Rheumatoid Arthritis; Osteoarthritis; Osteomyelitis Neurologic Medical History: Positive for: Neuropathy Negative for: Dementia; Quadriplegia; Paraplegia; Seizure Disorder Past Medical History Notes: CVA in 2014 Oncologic Medical History: Negative for: Received Chemotherapy; Received Radiation Psychiatric FELICIA, BOTH ([id number]) Complaints and Symptoms: No Complaints or Symptoms Medical History: Negative for: Anorexia/bulimia; Confinement Anxiety Immunizations Pneumococcal Vaccine: Received Pneumococcal Vaccination: Yes Immunization Notes: up to date Implantable Devices Hospitalization / Surgery History Name of Hospital Purpose of Hospitalization/Surgery Date [facility] CVA 05/17/2012 [facility] Pneumonia 03/17/2017 Family and  Social History Cancer: No; Diabetes: No; Heart Disease: Yes - Mother,Father; Hereditary Spherocytosis: No; Hypertension: Yes - Mother,Father; Stroke: Yes - Father; Thyroid Problems: No; Tuberculosis: No; Current every day smoker; Marital Status - Single; Alcohol Use: Never; Drug Use: No History; Caffeine Use: Daily; Financial Concerns: No; Food, Clothing or Shelter Needs: No; Support System Lacking: No; Transportation Concerns: No; Advanced Directives: No; Patient does not want information on Advanced Directives Physician Affirmation I have reviewed and agree with the above information. Electronic Signature(s) Signed: 03/02/2018 5:08:41 PM By: Worthy Keeler PA-C Signed: 03/02/2018 5:25:24 PM By: Montey Hora Entered By: Worthy Keeler on 03/02/2018 10:11:29 Hamstra, [name] ([id number]) -------------------------------------------------------------------------------- SuperBill Details Patient Name: [name] Date of Service: 02/28/2018 Medical Record Number: [id number] Patient Account Number: [id number] Date of Birth/Sex: 1951-01-13 (67 y.o. M) Treating RN: Montey Hora Primary Care Provider: Royetta Crochet Other Clinician: Referring Provider: Royetta Crochet Treating Provider/Extender: Melburn Hake, Ludia Gartland Weeks in Treatment: 18 Diagnosis Coding ICD-10 Codes Code Description I87.2 Venous insufficiency (chronic) (peripheral) I89.0 Lymphedema, not elsewhere classified E11.622 Type 2 diabetes mellitus with other skin ulcer L97.812 Non-pressure chronic ulcer of other part of right lower leg with fat layer exposed Facility Procedures CPT4 Code Description: 18841660 11042 - DEB SUBQ TISSUE 20 SQ CM/< ICD-10  Diagnosis Description L97.812 Non-pressure chronic ulcer of other part of right lower leg wit Modifier: h fat layer expo Quantity: 1 sed Physician Procedures CPT4 Code Description: 4196222 11042 - WC PHYS SUBQ TISS 20 SQ CM ICD-10 Diagnosis Description L79.892 Non-pressure  chronic ulcer of other part of right lower leg wit Modifier: h fat layer expo Quantity: 1 sed Electronic Signature(s) Signed: 03/01/2018 11:13:36 AM By: Worthy Keeler PA-C Entered By: Worthy Keeler on 03/01/2018 10:31:44

## 2018-03-07 ENCOUNTER — Encounter: Payer: Self-pay | Admitting: Emergency Medicine

## 2018-03-07 ENCOUNTER — Encounter: Payer: Medicare Other | Admitting: Physician Assistant

## 2018-03-07 ENCOUNTER — Emergency Department: Payer: Medicare Other

## 2018-03-07 ENCOUNTER — Emergency Department
Admission: EM | Admit: 2018-03-07 | Discharge: 2018-03-07 | Disposition: A | Discharge status: Home / Self Care | Payer: Medicare Other | Attending: Emergency Medicine | Admitting: Emergency Medicine

## 2018-03-07 ENCOUNTER — Other Ambulatory Visit: Payer: Self-pay

## 2018-03-07 DIAGNOSIS — I10 Essential (primary) hypertension: Secondary | ICD-10-CM | POA: Insufficient documentation

## 2018-03-07 DIAGNOSIS — F17210 Nicotine dependence, cigarettes, uncomplicated: Secondary | ICD-10-CM | POA: Insufficient documentation

## 2018-03-07 DIAGNOSIS — Z7901 Long term (current) use of anticoagulants: Secondary | ICD-10-CM | POA: Diagnosis not present

## 2018-03-07 DIAGNOSIS — R479 Unspecified speech disturbances: Secondary | ICD-10-CM | POA: Insufficient documentation

## 2018-03-07 DIAGNOSIS — Z7984 Long term (current) use of oral hypoglycemic drugs: Secondary | ICD-10-CM | POA: Insufficient documentation

## 2018-03-07 DIAGNOSIS — E11622 Type 2 diabetes mellitus with other skin ulcer: Secondary | ICD-10-CM | POA: Diagnosis not present

## 2018-03-07 DIAGNOSIS — I4819 Other persistent atrial fibrillation: Secondary | ICD-10-CM | POA: Insufficient documentation

## 2018-03-07 DIAGNOSIS — E119 Type 2 diabetes mellitus without complications: Secondary | ICD-10-CM | POA: Diagnosis not present

## 2018-03-07 LAB — BASIC METABOLIC PANEL
Anion gap: 10 (ref 5–15)
BUN: 16 mg/dL (ref 8–23)
CHLORIDE: 105 mmol/L (ref 98–111)
CO2: 25 mmol/L (ref 22–32)
CREATININE: 1.2 mg/dL (ref 0.61–1.24)
Calcium: 8.9 mg/dL (ref 8.9–10.3)
GFR, EST NON AFRICAN AMERICAN: 60 mL/min — AB (ref >60)
Glucose, Bld: 101 mg/dL — ABNORMAL HIGH (ref 70–99)
POTASSIUM: 4.1 mmol/L (ref 3.5–5.1)
SODIUM: 140 mmol/L (ref 135–145)

## 2018-03-07 LAB — CBC
HCT: 39.8 % (ref 39.0–52.0)
Hemoglobin: 12.3 g/dL — ABNORMAL LOW (ref 13.0–17.0)
MCH: 23.9 pg — ABNORMAL LOW (ref 26.0–34.0)
MCHC: 30.9 g/dL (ref 30.0–36.0)
MCV: 77.3 fL — ABNORMAL LOW (ref 80.0–100.0)
NRBC: 0 % (ref 0.0–0.2)
PLATELETS: 150 10*3/uL (ref 150–400)
RBC: 5.15 MIL/uL (ref 4.22–5.81)
RDW: 17.6 % — ABNORMAL HIGH (ref 11.5–15.5)
WBC: 3.7 10*3/uL — ABNORMAL LOW (ref 4.0–10.5)

## 2018-03-07 LAB — GLUCOSE, CAPILLARY: GLUCOSE-CAPILLARY: 97 mg/dL (ref 70–99)

## 2018-03-07 NOTE — ED Notes (Signed)
Family at bedside. 

## 2018-03-07 NOTE — Discharge Instructions (Addendum)
You were evaluated for possible slurred speech and slow heart rate, and as we discussed your exam and evaluation are reassuring in the Emergency Department today.  Return to the ER for any worsening condition including weakness, numbness, dizziness, or passing out, slurred speech or any other symptoms concerning to you.

## 2018-03-07 NOTE — ED Triage Notes (Addendum)
Pt to ED via EMS from wound care center with c/o weakness since last night and bradycardia. PT hx of a-fib and HR into the 30s intermit per EMS. Pt A&OX4, 138/72 , glucose 132 per EMS.
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
## 2018-03-07 NOTE — ED Notes (Signed)
PT aware of needed urine specimen, unable to void at this time. Urinal at bedside

## 2018-03-10 NOTE — Progress Notes (Signed)
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 ablation of the right greater saphenous vein from the distal thigh to the mid calf level. Successful ablation of the right small saphenous vein. He had unsuccessful ablation of the right greater saphenous vein from the saphenofemoral junction to the distal 5 level. The patient wears to let her  compression stockings and he has been compliant with this With regards to his diabetes he is not currently on any treatment. He does describe pain in his leg which forces him to stop although I couldn't really quantify this. His last arterial studies were on 07/05/16 which showed triphasic waveforms on the right to the level of the popliteal artery. The right posterior tibial and anterior tibial artery were not visualized on this study. I don't actually see ABIs or TBIs. 05/04/17; the patient has small wounds on the right medial heel and the right medial leg. The area on the foot is just about closed. The area on the right medial leg is improved. He is tolerating the 3 layer compression without any complaints. The patient has both known PAD and chronic venous reflux [see discussion above]. 05/11/17; the areas on the patient's medial right heel with healed he still has an open area on the right medial leg. This does not require debridement today I think I did read it last week. We are using silver collagen under 3 layer compression. 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 He has been tolerating the dressing changes without complication. With that being said he does have some epithelialization growing into the wound bed and it appears this wound may be trying to healed in a depressed fashion. To be honest that is not the worst case scenario and I think this may definitely do fine even if it heals in that way. There was no requirement for debridement today as patient appears to be doing very well in regard to his ulcer. His swelling was a little bit more severe than last week but nothing too significant. 07/05/17 on evaluation today patient appears to be doing rather well in regard to his right medial lower extremity ulcer. The wound  does appear to be healing although it is healing and somewhat of a cratered fashion the good news is he has excellent epithelium noted. Fortunately there does not appear to be any evidence of infection at this point he is tolerating the dressings as well is the wrap well. 07/12/17 on evaluation today patient appears to be doing very well in regard to his left medial lower extremity ulcer. He has been tolerating the dressing changes without complication.  In fact compared to last week this wound which was somewhat concave has fielding dramatically and looks to be doing excellent. I'm extremely happy with the progress. He still has pain rated to be a 4-5/10 but the wound looks great. 07/20/17; the patient's right medial lower extremity ulcer is closed. He has good edema control. Unfortunately he does not really have adequate compression stockings. 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 He has been treated with compression stockings for some time now in fact we have notes having treated him even as far back as 2016 here in our clinic. Fortunately he has excellent blood flow and tends to heal well. Unfortunately despite wearing his compression stockings, keeping his legs elevated, being on fluid pills (Lasix) and doing everything he can to try to help with fluid he still continues to have issues with this reopening. I think that he may be a good candidate for compression/lymphedema pumps. We have never attempted to get these for him previously. No fevers, chills, nausea, or vomiting noted at this time. Currently patient states his ulcer does hurt although not as bad as some in the past  it's also not as large as it has been in the past when it reopened he talked is somewhat early. Obviously this is good news. AREL, TIPPEN ([id number]) 12/13/17 on evaluation today patient appears to actually be doing very well in regard to his right medial ankle ulcer compared to last week's evaluation. I think having him on the correct antibiotics has made all the difference in the world. With that being said he's not having discomfort like he was previous and in general I feel like he has made excellent strides towards healing in the past week. No fevers, chills, nausea, or vomiting noted at this time. 01/10/18 on evaluation today patient appears to be doing a little worse compared to last week's evaluation. I think that though the Iodoflex has help with cleaning up the surface of the wound subsequently he has having a lot of drainage which I think is causing some issues in that regard. At this point I think we may want to switch the dressings. 01/17/18 on evaluation today patient appears to be doing rather well in regard to his right medial malleolus ulcer. The good news is I do feel like he showing some signs of improvement which is excellent. I feel like that the [facility] Dressing been better for him than the Iodoflex that we will previously utilizing. He is having much less in the way of discomfort which is good news. Overall very pleased in that regard. Nonetheless the appearance of the wound is improved today as well and that is excellent news. He still has pain but not seemingly as much as previous and I don't feel like there's much maceration either. 01/23/18 on evaluation today patient appears in our clinic a day early for his appointment due to the fact that he was having discomfort and issues with his wrap. Upon inspection of the wound actually appears to be doing fairly well which is good news. With that being said he is having a lot of discomfort at this point unfortunately. I  think that this is mainly  due to the fact that he is having such drainage as the wound bed itself seems to be doing well. I do believe the [facility] Dressing well is helpful for him which is good news. Nonetheless again I think he may need more frequent dressing changes. 01/31/18 on evaluation today patient actually appears to be doing very well in regard to his right medial ankle ulcer. This is significantly better than what was even noted last week I do think that the extra dressing change with the nurse visit toward the end of the week made a significant improvement for him. Overall I'm very pleased with how things appear today. 02/07/18 on evaluation today patient actually appears to be doing a little better in regard to his wound. The measurements are smaller today compared to previous. Fortunately there's no evidence of any infection and I do believe the moisture is much better control with more frequent wrap changes. Overall the patient seems to be making good progress. 02/14/18 on evaluation today patient actually appears to be doing very well in regard to his medial ankle ulcer of the right lower extremity. With that being said he saw slough buildup and this is still been somewhat slow to heal. For that reason my suggestion currently is gonna be that we may need to see about checking on a skin substitute possibly Apligraf to see if this will be of benefit for him. In the past he has benefited from Apligraf. Overall there does not appear to be any evidence of infection at this time which is definitely good news. 02/28/18 on evaluation today patient's medial ankle ulcer on the right actually appears to be doing significantly better compared to previous. He has been tolerating the dressing changes without complication. Fortunately there appears to be no evidence of infection which is great news. Overall I do feel like that he has made improvement although there has been some delay in general  and the wound is taken a little longer than I would've liked up to this point. Nonetheless there's no sign of infection and therefore I think that we can proceed with the Apligraf applications we actually got this approved for him. That something that I'm gonna see about ordering for next week. 03/07/18 on evaluation today patient unfortunately appears to be doing significantly worse than normal as far as his overall and general appearance is concerned. He has been tolerating the dressing changes without complication. With that being said nothing appears to be problematic in regard to his wound. The issue that I see right now involves more his general malaise and the fact that he does appear to have atrial fibrillation which is a known issue but unfortunately he is dropping low to the point of having 3-5 seconds of sustained pulses that are around 30 bpm. This was confirmed actually during evaluation today by myself. He's been having left lower quadrant pain and was actually in the emergency department on 17 October most recently. According to the notes reviewed it did not appear that anything was identified as far as a cause of the reason for this. It does not appear to the wound related which is good news. Nonetheless still I'm unsure of exactly what is going on but I am worried on top of everything else and the patient feeling lethargic he showed up late for his appointment today which is very uncharacteristic for this gentleman. Patient History Information obtained from Patient. Family History Heart Disease - Mother,Father, Hypertension - Mother,Father, Stroke - Father, Crickenberger,  Bee ([id number]) No family history of Cancer, Diabetes, Hereditary Spherocytosis, Thyroid Problems, Tuberculosis. Social History Current every day smoker, Marital Status - Single, Alcohol Use - Never, Drug Use - No History, Caffeine Use - Daily. Medical History Hospitalization/Surgery History - 05/17/2012, [facility],  CVA. - 03/17/2017, [facility], Pneumonia. Medical And Surgical History Notes Ear/Nose/Mouth/Throat difficulty speaking Respiratory Hospitalized for Pneumonia 02/2017 Neurologic CVA in 2014 Review of Systems (ROS) Constitutional Symptoms (General Health) Complains or has symptoms of Fatigue. Denies complaints or symptoms of Fever, Chills. Respiratory The patient has no complaints or symptoms. Cardiovascular The patient has no complaints or symptoms. Psychiatric The patient has no complaints or symptoms. Objective Constitutional patient is hypertensive.. heart rate irregular (Afib) and low with 3-5 second sustained periods of his heart rate dropping to the low 30s. respirations regular, non-labored and within target range for patient.[name] temperature within target range for patient.. Ill appearing but in NAD. Vitals Time Taken: 8:10 AM, Height: 74 in, Weight: 233.6 lbs, BMI: 30, Temperature: 97.7 F, Pulse: 46 bpm, Respiratory Rate: 18 breaths/min, Blood Pressure: 145/94 mmHg. Eyes conjunctiva clear no eyelid edema noted. pupils equal round and reactive to light and accommodation. Ears, Nose, Mouth, and Throat no gross abnormality of ear auricles or external auditory canals. normal hearing noted during conversation. mucus membranes moist. Respiratory normal breathing without difficulty. clear to auscultation bilaterally. Cardiovascular Low pulse with Afib noted. no clubbing, cyanosis, significant edema, Gastrointestinal (GI) [name], Cono ([id number]) Left lower quadrant abdominal pain. no ventral hernia noted. Musculoskeletal normal gait and posture. no significant deformity or arthritic changes, no loss or range of motion, no clubbing. Psychiatric this patient is able to make decisions and demonstrates good insight into disease process. Alert and Oriented x 3. Patient appears very lethargic. General Notes: Patient's wound again at this point appears to be doing excellent. I did  not perform any debridement today since he seems to be doing well I'm just going to continue with the Current wound care measures and we did rewrap his leg at this point. With that being said I'm much more concerned about the general malaise and overall poor feeling that he is experiencing at this point. Integumentary (Hair, Skin) Wound #8 status is Open. Original cause of wound was Gradually Appeared. The wound is located on the Right,Proximal,Medial Lower Leg. The wound measures 4cm length x 3.5cm width x 0.2cm depth; 10.996cm^2 area and 2.199cm^3 volume. There is Fat Layer (Subcutaneous Tissue) Exposed exposed. There is no tunneling or undermining noted. There is a large amount of serous drainage noted. The wound margin is flat and intact. There is large (67-100%) red, pink granulation within the wound bed. There is a small (1-33%) amount of necrotic tissue within the wound bed including Adherent Slough. The periwound skin appearance exhibited: Scarring, Hemosiderin Staining. The periwound skin appearance did not exhibit: Callus, Crepitus, Excoriation, Induration, Rash, Dry/Scaly, Maceration, Atrophie Blanche, Cyanosis, Ecchymosis, Mottled, Pallor, Rubor, Erythema. Periwound temperature was noted as No Abnormality. The periwound has tenderness on palpation. 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 There was a Three Layer Compression Therapy Procedure with a pre-treatment ABI of 1.2 by Cornell Barman, RN. Post procedure Diagnosis Wound #8: Same as Pre-Procedure Plan Pang, Corinthian ([id number]) Wound Cleansing:  Wound #8  Right,Proximal,Medial Lower Leg: Clean wound with Normal Saline. Cleanse wound with mild soap and water Anesthetic (add to Medication List): Wound #8 Right,Proximal,Medial Lower Leg: Topical Lidocaine 4% cream applied to wound bed prior to debridement (In Clinic Only). Skin Barriers/Peri-Wound Care: Wound #8 Right,Proximal,Medial Lower Leg: Moisturizing lotion - not on wound area Primary Wound Dressing: Wound #8 Right,Proximal,Medial Lower Leg: Hydrafera Blue Ready Transfer Secondary Dressing: Wound #8 Right,Proximal,Medial Lower Leg: ABD pad Dressing Change Frequency: Wound #8 Right,Proximal,Medial Lower Leg: Change dressing every week Follow-up Appointments: Wound #8 Right,Proximal,Medial Lower Leg: Return Appointment in 1 week. Nurse Visit as needed Edema Control: Wound #8 Right,Proximal,Medial Lower Leg: 3 Layer Compression System - Right Lower Extremity - unna to anchor Other: - order bilateral compression pumps Additional Orders / Instructions: Wound #8 Right,Proximal,Medial Lower Leg: Increase protein intake. My suggestion currently is going to be that we go ahead and have the patient transported by EMS to the Emergency department for further evaluation. We will continue with the Current wound care measures at this point. He is in agreement with that plan. Obviously he is not feeling very well at all today. We will see him back for reevaluation next week to see were things stand. Please see above for specific wound care orders. We will see patient for re-evaluation in 1 week(s) here in the clinic. If anything worsens or changes patient will contact our office for additional recommendations. Electronic Signature(s) Signed: 03/09/2018 1:45:00 AM By: Worthy Keeler PA-C Entered By: Worthy Keeler on 03/07/2018 08:58:31 [name], [name] ([id number]) -------------------------------------------------------------------------------- ROS/PFSH Details Patient Name:  [name] Date of Service: 03/07/2018 8:15 AM Medical Record Number: [id number] Patient Account Number: [id number] Date of Birth/Sex: 1950/09/22 (67 y.o. M) Treating RN: Montey Hora Primary Care Provider: Royetta Crochet Other Clinician: Referring Provider: Royetta Crochet Treating Provider/Extender: Melburn Hake, Anaiah Mcmannis Weeks in Treatment: 19 Information Obtained From Patient Wound History Do you currently have one or more open woundso Yes How many open wounds do you currently haveo 1 Approximately how long have you had your woundso 1 week How have you been treating your wound(s) until nowo lotion Has your wound(s) ever healed and then re-openedo Yes Have you had any lab work done in the past montho No Have you tested positive for an antibiotic resistant organism (MRSA, VRE)o No Have you tested positive for osteomyelitis (bone infection)o No Have you had any tests for circulation on your legso Yes Who ordered the testo [facility] Where was the test doneo [facility] Constitutional Symptoms (General Health) Complaints and Symptoms: Positive for: Fatigue Negative for: Fever; Chills Eyes Medical History: Negative for: Cataracts; Glaucoma; Optic Neuritis Ear/Nose/Mouth/Throat Medical History: Negative for: Chronic sinus problems/congestion; Middle ear problems Past Medical History Notes: difficulty speaking Hematologic/Lymphatic Medical History: Negative for: Anemia; Hemophilia; Human Immunodeficiency Virus; Lymphedema; Sickle Cell Disease Respiratory Complaints and Symptoms: No Complaints or Symptoms Medical History: Positive for: Chronic Obstructive Pulmonary Disease (COPD) Negative for: Aspiration; Asthma; Pneumothorax; Sleep Apnea; Tuberculosis Past Medical History Notes: Hospitalized for Pneumonia 02/2017 JUSTYCE, BABY ([id number]) Cardiovascular Complaints and Symptoms: No Complaints or Symptoms Medical History: Positive for: Arrhythmia - a fib; Deep Vein  Thrombosis; Hypertension Negative for: Angina; Congestive Heart Failure; Coronary Artery Disease; Hypotension; Myocardial Infarction; Peripheral Arterial Disease; Peripheral Venous Disease; Phlebitis; Vasculitis Gastrointestinal Medical History: Negative for: Cirrhosis ; Colitis; Crohnos; Hepatitis A; Hepatitis B; Hepatitis C Endocrine Medical History: Positive for: Type II Diabetes Negative for: Type I Diabetes Time with diabetes: since 2017 Treated with: Diet Blood sugar tested  every day: No Genitourinary Medical History: Negative for: End Stage Renal Disease Immunological Medical History: Negative for: Lupus Erythematosus; Raynaudos; Scleroderma Integumentary (Skin) Medical History: Negative for: History of Burn; History of pressure wounds Musculoskeletal Medical History: Positive for: Gout Negative for: Rheumatoid Arthritis; Osteoarthritis; Osteomyelitis Neurologic Medical History: Positive for: Neuropathy Negative for: Dementia; Quadriplegia; Paraplegia; Seizure Disorder Past Medical History Notes: CVA in 2014 Oncologic Medical History: Negative for: Received Chemotherapy; Received Radiation Psychiatric LYAM, PROVENCIO ([id number]) Complaints and Symptoms: No Complaints or Symptoms Medical History: Negative for: Anorexia/bulimia; Confinement Anxiety Immunizations Pneumococcal Vaccine: Received Pneumococcal Vaccination: Yes Immunization Notes: up to date Implantable Devices Hospitalization / Surgery History Name of Hospital Purpose of Hospitalization/Surgery Date [place] CVA 05/17/2012 [facility] Pneumonia 03/17/2017 Family and Social History Cancer: No; Diabetes: No; Heart Disease: Yes - Mother,Father; Hereditary Spherocytosis: No; Hypertension: Yes - Mother,Father; Stroke: Yes - Father; Thyroid Problems: No; Tuberculosis: No; Current every day smoker; Marital Status - Single; Alcohol Use: Never; Drug Use: No History; Caffeine Use: Daily; Financial Concerns: No;  Food, Clothing or Shelter Needs: No; Support System Lacking: No; Transportation Concerns: No; Advanced Directives: No; Patient does not want information on Advanced Directives Physician Affirmation I have reviewed and agree with the above information. Electronic Signature(s) Signed: 03/07/2018 5:03:55 PM By: Montey Hora Signed: 03/09/2018 1:45:00 AM By: Worthy Keeler PA-C Entered By: Worthy Keeler on 03/07/2018 08:51:58 Splitt, [name] ([id number]) -------------------------------------------------------------------------------- SuperBill Details Patient Name: [name] Date of Service: 03/07/2018 Medical Record Number: [id number] Patient Account Number: [id number] Date of Birth/Sex: [birth year] (67 y.o. M) Treating RN: Cornell Barman Primary Care Provider: Royetta Crochet Other Clinician: Referring Provider: Royetta Crochet Treating Provider/Extender: Melburn Hake, Karlon Schlafer Weeks in Treatment: 19 Diagnosis Coding ICD-10 Codes Code Description I87.2 Venous insufficiency (chronic) (peripheral) I89.0 Lymphedema, not elsewhere classified E11.622 Type 2 diabetes mellitus with other skin ulcer L97.812 Non-pressure chronic ulcer of other part of right lower leg with fat layer exposed Facility Procedures CPT4 Code Description: [id number] (Facility Use Only) [phone] - APPLY MULTLAY COMPRS LWR RT LEG Modifier: Quantity: 1 Physician Procedures CPT4 Code Description: [id number] - WC PHYS LEVEL 4 - EST PT ICD-10 Diagnosis Description I87.2 Venous insufficiency (chronic) (peripheral) I89.0 Lymphedema, not elsewhere classified E11.622 Type 2 diabetes mellitus with other skin ulcer L97.812  Non-pressure chronic ulcer of other part of right lower leg wi Modifier: th fat layer expos Quantity: 1 ed Electronic Signature(s) Signed: 03/09/2018 1:45:00 AM By: Worthy Keeler PA-C Entered By: Worthy Keeler on 03/07/2018 08:57:29

## 2018-03-10 NOTE — Progress Notes (Signed)
RAFAN, SANDERS ([id number]) Visit Report for 03/07/2018 Arrival Information Details Patient Name: [name], [name] Date of Service: 03/07/2018 8:15 AM Medical Record Number: [id number] Patient Account Number: [id number] Date of Birth/Sex: 06-02-1950 (67 y.o. M) Treating RN: Montey Hora Primary Care Easter Kennebrew: Royetta Crochet Other Clinician: Referring Addy Mcmannis: Royetta Crochet Treating Cy Bresee/Extender: Melburn Hake, HOYT Weeks in Treatment: 24 Visit Information History Since Last Visit Added or deleted any medications: No Patient Arrived: Ambulatory Any new allergies or adverse reactions: No Arrival Time: 08:07 Had a fall or experienced change in No Accompanied By: self activities of daily living that may affect Transfer Assistance: None risk of falls: Patient Identification Verified: Yes Signs or symptoms of abuse/neglect since last visito No Secondary Verification Process Yes Hospitalized since last visit: No Completed: Implantable device outside of the clinic excluding No Patient Requires Transmission-Based No cellular tissue based products placed in the center Precautions: since last visit: Patient Has Alerts: Yes Has Dressing in Place as Prescribed: Yes Patient Alerts: Patient on Blood Pain Present Now: Yes Thinner DMII warfarin Electronic Signature(s) Signed: 03/07/2018 2:02:18 PM By: Lorine Bears RCP, RRT, CHT Entered By: Lorine Bears on 03/07/2018 08:11:00 [name], [name] ([id number]) -------------------------------------------------------------------------------- Clinic Level of Care Assessment Details Patient Name: [name] Date of Service: 03/07/2018 8:15 AM Medical Record Number: [id number] Patient Account Number: [id number] Date of Birth/Sex: May 11, 1951 (67 y.o. M) Treating RN: Cornell Barman Primary Care Ellinor Test: Royetta Crochet Other Clinician: Referring Tramar Brueckner: Royetta Crochet Treating Janetta Vandoren/Extender:  Melburn Hake, HOYT Weeks in Treatment: 19 Clinic Level of Care Assessment Items TOOL 3 Quantity Score []  - Use when EandM and Procedure is performed on FOLLOW-UP visit 0 ASSESSMENTS - Nursing Assessment / Reassessment X - Reassessment of Co-morbidities (includes updates in patient status) 1 10 []  - 0 Reassessment of Adherence to Treatment Plan ASSESSMENTS - Wound and Skin Assessment / Reassessment []  - Points for Wound Assessment can only be taken for a new wound of unknown or different 0 etiology and a procedure is NOT performed to that wound []  - 0 Simple Wound Assessment / Reassessment - one wound []  - 0 Complex Wound Assessment / Reassessment - multiple wounds []  - 0 Dermatologic / Skin Assessment (not related to wound area) ASSESSMENTS - Focused Assessment []  - Circumferential Edema Measurements - multi extremities 0 []  - 0 Nutritional Assessment / Counseling / Intervention []  - 0 Lower Extremity Assessment (monofilament, tuning fork, pulses) []  - 0 Peripheral Arterial Disease Assessment (using hand held doppler) ASSESSMENTS - Ostomy and/or Continence Assessment and Care []  - Incontinence Assessment and Management 0 []  - 0 Ostomy Care Assessment and Management (repouching, etc.) PROCESS - Coordination of Care []  - Points for Discharge Coordination can only be taken for a new wound of unknown or different 0 etiology and a procedure is NOT performed to that wound []  - 0 Simple Patient / Family Education for ongoing care []  - 0 Complex (extensive) Patient / Family Education for ongoing care []  - 0 Staff obtains Programmer, systems, Records, Test Results / Process Orders []  - 0 Staff telephones HHA, Nursing Homes / Clarify orders / etc []  - 0 Routine Transfer to another Facility (non-emergent condition) []  - 0 Routine Hospital Admission (non-emergent condition) [name], Mark ([id number]) []  - 0 New Admissions / Biomedical engineer / Ordering NPWT, Apligraf, etc. X- 1  20 Emergency Hospital Admission (emergent condition) []  - 0 Simple Discharge Coordination []  - 0 Complex (extensive) Discharge Coordination PROCESS - Special Needs []  - Pediatric / Minor Patient Management 0 []  -  0 Isolation Patient Management []  - 0 Hearing / Language / Visual special needs []  - 0 Assessment of Community assistance (transportation, D/C planning, etc.) []  - 0 Additional assistance / Altered mentation []  - 0 Support Surface(s) Assessment (bed, cushion, seat, etc.) INTERVENTIONS - Wound Cleansing / Measurement []  - Points for Wound Cleaning / Measurement, Wound Dressing, Specimen Collection and 0 Specimen taken to lab can only be taken for a new wound of unknown or different etiology and a procedure is NOT performed to that wound []  - 0 Simple Wound Cleansing - one wound []  - 0 Complex Wound Cleansing - multiple wounds []  - 0 Wound Imaging (photographs - any number of wounds) []  - 0 Wound Tracing (instead of photographs) []  - 0 Simple Wound Measurement - one wound []  - 0 Complex Wound Measurement - multiple wounds INTERVENTIONS - Wound Dressings []  - Small Wound Dressing one or multiple wounds 0 []  - 0 Medium Wound Dressing one or multiple wounds []  - 0 Large Wound Dressing one or multiple wounds INTERVENTIONS - Miscellaneous []  - External ear exam 0 []  - 0 Specimen Collection (cultures, biopsies, blood, body fluids, etc.) []  - 0 Specimen(s) / Culture(s) sent or taken to Lab for analysis []  - 0 Patient Transfer (multiple staff / Civil Service fast streamer / Similar devices) []  - 0 Simple Staple / Suture removal (25 or less) []  - 0 Complex Staple / Suture removal (26 or more) [name], Mark ([id number]) []  - 0 Hypo / Hyperglycemic Management (close monitor of Blood Glucose) []  - 0 Ankle / Brachial Index (ABI) - do not check if billed separately X- 1 5 Vital Signs Has the patient been seen at the hospital within the last three years: Yes Total Score: 35  Level Of Care: New/Established - Level 1 Electronic Signature(s) Signed: 03/07/2018 4:14:49 PM By: Gretta Cool, [name], RN, CWS, [name], [name] Entered By: Gretta Cool, [name], RN, CWS, Kim on 03/07/2018 08:44:53 [name], [name] ([id number]) -------------------------------------------------------------------------------- Compression Therapy Details Patient Name: [name] Date of Service: 03/07/2018 8:15 AM Medical Record Number: [id number] Patient Account Number: [id number] Date of Birth/Sex: October 30, 1950 (67 y.o. M) Treating RN: Cornell Barman Primary Care Alyla Pietila: Royetta Crochet Other Clinician: Referring Wagner Tanzi: Royetta Crochet Treating Fanchon Papania/Extender: Melburn Hake, HOYT Weeks in Treatment: 19 Compression Therapy Performed for Wound Assessment: Wound #8 Right,Proximal,Medial Lower Leg Performed By: Clinician Cornell Barman, RN Compression Type: Three Layer Pre Treatment ABI: 1.2 Post Procedure Diagnosis Same as Pre-procedure Electronic Signature(s) Signed: 03/07/2018 4:14:49 PM By: Gretta Cool, [name], RN, CWS, [name], [name] Entered By: Gretta Cool, [name], RN, CWS, Kim on 03/07/2018 08:26:20 [name] -------------------------------------------------------------------------------- Encounter Discharge Information Details Patient Name: [name] Date of Service: 03/07/2018 8:15 AM Medical Record Number: [id number] Patient Account Number: [id number] Date of Birth/Sex: 1950-12-05 (67 y.o. M) Treating RN: Cornell Barman Primary Care Nakai Pollio: Royetta Crochet Other Clinician: Referring Allissa Albright: Royetta Crochet Treating Claude Swendsen/Extender: Melburn Hake, HOYT Weeks in Treatment: 75 Encounter Discharge Information Items Discharge Condition: Unstable Ambulatory Status: Stretcher Discharge Destination: Hospital Telephoned: Yes Spoke With: A spoke with Heather Transportation: Ambulance Accompanied By: EMS Schedule Follow-up Appointment: Yes Clinical Summary of Care: Electronic  Signature(s) Signed: 03/07/2018 4:14:49 PM By: Gretta Cool, [name], RN, CWS, [name], [name] Entered By: Gretta Cool, [name], RN, CWS, Kim on 03/07/2018 08:44:04 [name], [name] ([id number]) -------------------------------------------------------------------------------- Lower Extremity Assessment Details Patient Name: [name] Date of Service: 03/07/2018 8:15 AM Medical Record Number: [id number] Patient Account Number: [id number] Date of Birth/Sex: [birth year] (67 y.o. M) Treating RN: Cornell Barman Primary Care Lauris Keepers: Royetta Crochet Other  Clinician: Referring Myishia Kasik: Royetta Crochet Treating Shandricka Monroy/Extender: STONE III, HOYT Weeks in Treatment: 19 Edema Assessment Assessed: [Left: No] [Right: No] E[Left: dema] [Right: :] Calf Left: Right: Point of Measurement: 38 cm From Medial Instep cm 42 cm Ankle Left: Right: Point of Measurement: 14 cm From Medial Instep cm 27.5 cm Vascular Assessment Pulses: Dorsalis Pedis Palpable: [Right:Yes] Posterior Tibial Extremity colors, hair growth, and conditions: Extremity Color: [Right:Hyperpigmented] Hair Growth on Extremity: [Right:No] Temperature of Extremity: [Right:Warm] Capillary Refill: [Right:< 3 seconds] Toe Nail Assessment Left: Right: Thick: Yes Discolored: Yes Deformed: Yes Improper Length and Hygiene: No Electronic Signature(s) Signed: 03/07/2018 4:14:49 PM By: Gretta Cool, [name], RN, CWS, [name], [name] Entered By: Gretta Cool, [name], RN, CWS, Kim on 03/07/2018 08:22:10 Rase, [name] ([id number]) -------------------------------------------------------------------------------- Multi Wound Chart Details Patient Name: [name] Date of Service: 03/07/2018 8:15 AM Medical Record Number: [id number] Patient Account Number: [id number] Date of Birth/Sex: 04/01/1951 (67 y.o. 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] [N/A:N/A] Secondary Etiology: [8:Lymphedema] [N/A:N/A] Comorbid History: [8:Chronic Obstructive Pulmonary Disease (COPD), Arrhythmia, Deep Vein Thrombosis, Hypertension, Type II Diabetes, Gout, Neuropathy] [N/A:N/A] Date Acquired: [8:10/17/2017] [N/A:N/A] Weeks of Treatment: [8:19] [N/A:N/A] Wound Status: [8:Open] [N/A:N/A] Measurements L x W x D [8:4x3.5x0.2] [N/A:N/A] (cm) Area (cm) : [8:10.996] [N/A:N/A] Volume (cm) : [8:2.199] [N/A:N/A] % Reduction in Area: [8:-6564.20%] [N/A:N/A] % Reduction in Volume: [8:-13643.70%] [N/A:N/A] Classification: [8:Grade 2] [N/A:N/A] Exudate Amount: [8:Large] [N/A:N/A] Exudate Type: [8:Serous] [N/A:N/A] Exudate Color: [8:amber] [N/A:N/A] Wound Margin: [8:Flat and Intact] [N/A:N/A] Granulation Amount: [8:Large (67-100%)] [N/A:N/A] Granulation Quality: [8:Red, Pink] [N/A:N/A] Necrotic Amount: [8:Small (1-33%)] [N/A:N/A] Exposed Structures: [8:Fat Layer (Subcutaneous Tissue) Exposed: Yes Fascia: No Tendon: No Muscle: No Joint: No Bone: No] [N/A:N/A] Epithelialization: Small (1-33%) N/A N/A Periwound Skin Texture: Scarring: Yes N/A N/A Excoriation: No Induration: No Callus: No Crepitus: No Rash: No Periwound Skin Moisture: Maceration: No N/A N/A Dry/Scaly: No Periwound Skin Color: Hemosiderin Staining: Yes N/A N/A Atrophie Blanche: No Cyanosis: No Ecchymosis: No Erythema: No Mottled: No Pallor: No Rubor: No Temperature: No Abnormality N/A N/A Tenderness on Palpation: Yes N/A N/A Wound Preparation: Ulcer Cleansing: N/A  N/A Rinsed/Irrigated with Saline, Other: soap and water Topical Anesthetic Applied: None, Other: lidocaine 4% Treatment Notes Electronic Signature(s) Signed: 03/07/2018 4:14:49 PM By: Gretta Cool, [name], RN, CWS, [name], [name] Entered By: Gretta Cool, [name], RN, CWS, Kim on 03/07/2018 08:25:54 [name], [name] ([id number]) -------------------------------------------------------------------------------- Multi-Disciplinary Care Plan Details Patient Name: [name] Date of Service: 03/07/2018 8:15 AM Medical Record Number: [id number] Patient Account Number: [id number] Date of Birth/Sex: 07-01-1950 (67 y.o. M) Treating RN: Cornell Barman Primary Care Brookelin Felber: Royetta Crochet Other Clinician: Referring Traquan Duarte: Royetta Crochet Treating Tranice Laduke/Extender: Melburn Hake, HOYT Weeks in Treatment: 68 Active Inactive ` Abuse / Safety / Falls / Self Care Management Nursing Diagnoses: Potential for falls Goals: Patient will not experience any injury related to falls Date Initiated: 10/25/2017 Target Resolution Date: 02/18/2018 Goal Status: Active Interventions: Assess Activities of Daily Living upon admission and as needed Assess fall risk on admission and as needed Assess: immobility, friction, shearing, incontinence upon admission and as needed Assess impairment of mobility on admission and as needed per policy Assess  personal safety and home safety (as indicated) on admission and as needed Assess self care needs on admission and as needed Notes: ` Nutrition Nursing Diagnoses: Imbalanced nutrition Impaired glucose control: actual or potential Potential for alteratiion in Nutrition/Potential for imbalanced nutrition Goals: Patient/caregiver agrees to and verbalizes understanding of need to use nutritional supplements and/or vitamins as prescribed Date Initiated: 10/25/2017 Target Resolution Date: 02/18/2018 Goal Status: Active Patient/caregiver will maintain therapeutic glucose control Date  Initiated: 10/25/2017 Target Resolution Date: 02/18/2018 Goal Status: Active Interventions: Assess patient nutrition upon admission and as needed per policy Provide education on elevated blood sugars and impact on wound healing Provide education on nutrition ANDREY, MCCASKILL ([id number]) Notes: ` Orientation to the Wound Care Program Nursing Diagnoses: Knowledge deficit related to the wound healing center program Goals: Patient/caregiver will verbalize understanding of the [facility] Program Date Initiated: 10/25/2017 Target Resolution Date: 11/19/2017 Goal Status: Active Interventions: Provide education on orientation to the wound center Notes: ` Wound/Skin Impairment Nursing Diagnoses: Impaired tissue integrity Knowledge deficit related to ulceration/compromised skin integrity Goals: Ulcer/skin breakdown will have a volume reduction of 80% by week 12 Date Initiated: 10/25/2017 Target Resolution Date: 02/11/2018 Goal Status: Active Interventions: Assess patient/caregiver ability to perform ulcer/skin care regimen upon admission and as needed Assess ulceration(s) every visit Notes: Electronic Signature(s) Signed: 03/07/2018 4:14:49 PM By: Gretta Cool, [name], RN, CWS, [name], [name] Entered By: Gretta Cool, [name], RN, CWS, Kim on 03/07/2018 08:25:46 [name], [name] ([id number]) -------------------------------------------------------------------------------- Pain Assessment Details Patient Name: [name] Date of Service: 03/07/2018 8:15 AM Medical Record Number: [id number] Patient Account Number: [id number] Date of Birth/Sex: 1951/02/13 (67 y.o. M) Treating RN: Montey Hora Primary Care Adeana Grilliot: Royetta Crochet Other Clinician: Referring Zaylie Gisler: Royetta Crochet Treating Aolanis Crispen/Extender: STONE III, HOYT Weeks in Treatment: 19 Active Problems Location of Pain Severity and Description of Pain Patient Has Paino Yes Site Locations Rate the pain. Current Pain Level:  8 Pain Management and Medication Current Pain Management: Electronic Signature(s) Signed: 03/07/2018 2:02:18 PM By: Lorine Bears RCP, RRT, CHT Signed: 03/07/2018 5:03:55 PM By: Montey Hora Entered By: Lorine Bears on 03/07/2018 08:11:11 [name], [name] ([id number]) -------------------------------------------------------------------------------- Wound Assessment Details Patient Name: [name] Date of Service: 03/07/2018 8:15 AM Medical Record Number: [id number] Patient Account Number: [id number] Date of Birth/Sex: [birth year] (67 y.o. M) Treating RN: Cornell Barman Primary Care Labron Bloodgood: Royetta Crochet Other Clinician: Referring Kinan Safley: Royetta Crochet Treating Antoino Westhoff/Extender: STONE III, HOYT Weeks in Treatment: 19 Wound Status Wound Number: 8 Primary Diabetic Wound/Ulcer of the Lower Extremity Etiology: Wound Location: Right Lower Leg - Medial, Proximal Secondary Lymphedema Wounding Event: Gradually Appeared Etiology: Date Acquired: 10/17/2017 Wound Open Weeks Of Treatment: 19 Status: Clustered Wound: No Comorbid Chronic Obstructive Pulmonary Disease History: (COPD), Arrhythmia, Deep Vein Thrombosis, Hypertension, Type II Diabetes, Gout, Neuropathy Photos Photo Uploaded By: Sharon Mt on 03/08/2018 11:14:59 Wound Measurements Length: (cm) 4 Width: (cm) 3.5 Depth: (cm) 0.2 Area: (cm) 10.996 Volume: (cm) 2.199 % Reduction in Area: -6564.2% % Reduction in Volume: -13643.7% Epithelialization: Small (1-33%) Tunneling: No Undermining: No Wound Description Classification: Grade 2 Foul Odo Wound Margin: Flat and Intact Slough/F Exudate Amount: Large Exudate Type: Serous Exudate Color: amber r After Cleansing: No ibrino Yes Wound Bed Granulation Amount: Large (67-100%) Exposed Structure Granulation Quality: Red, Pink Fascia Exposed: No Necrotic Amount: Small (1-33%) Fat Layer (Subcutaneous Tissue) Exposed:  Yes Necrotic Quality: Adherent Slough Tendon Exposed: No Muscle Exposed: No Joint Exposed: No [name], Mark ([id number]) Bone Exposed: No Periwound Skin Texture Texture Color No Abnormalities Noted: No  No Abnormalities Noted: No Callus: No Atrophie Blanche: No Crepitus: No Cyanosis: No Excoriation: No Ecchymosis: No Induration: No Erythema: No Rash: No Hemosiderin Staining: Yes Scarring: Yes Mottled: No Pallor: No Moisture Rubor: No No Abnormalities Noted: No Dry / Scaly: No Temperature / Pain Maceration: No Temperature: No Abnormality Tenderness on Palpation: Yes Wound Preparation Ulcer Cleansing: Rinsed/Irrigated with Saline, Other: soap and water, Topical Anesthetic Applied: None, Other: lidocaine 4%, Treatment Notes Wound #8 (Right, Proximal, Medial Lower Leg) Notes unna to anchor, Hydrofera Blue, Gauze secured with 3 Layer wrap Electronic Signature(s) Signed: 03/07/2018 4:14:49 PM By: Gretta Cool, [name], RN, CWS, [name], [name] Entered By: Gretta Cool, [name], RN, CWS, Kim on 03/07/2018 08:17:41 [name], [name] ([id number]) -------------------------------------------------------------------------------- Vitals Details Patient Name: [name] Date of Service: 03/07/2018 8:15 AM Medical Record Number: [id number] Patient Account Number: [id number] Date of Birth/Sex: 1950/12/12 (67 y.o. M) Treating RN: Montey Hora Primary Care Lysander Calixte: Royetta Crochet Other Clinician: Referring [name] Mcclenahan: Royetta Crochet Treating Tamyah Cutbirth/Extender: STONE III, HOYT Weeks in Treatment: 19 Vital Signs Time Taken: 08:10 Temperature (F): 97.7 Height (in): 74 Pulse (bpm): 46 Weight (lbs): 233.6 Respiratory Rate (breaths/min): 18 Body Mass Index (BMI): 30 Blood Pressure (mmHg): 145/94 Reference Range: 80 - 120 mg / dl Electronic Signature(s) Signed: 03/07/2018 2:02:18 PM By: Lorine Bears RCP, RRT, CHT Entered By: Lorine Bears on 03/07/2018  08:15:37

## 2018-03-15 ENCOUNTER — Encounter: Payer: Medicare Other | Admitting: Internal Medicine

## 2018-03-15 DIAGNOSIS — E11622 Type 2 diabetes mellitus with other skin ulcer: Secondary | ICD-10-CM | POA: Diagnosis not present

## 2018-03-16 ENCOUNTER — Ambulatory Visit: Payer: Medicare Other | Admitting: Physician Assistant

## 2018-03-18 NOTE — Progress Notes (Signed)
[name], [name] ([id number]) Visit Report for 03/15/2018 Arrival Information Details Patient Name: [name], [name] Date of Service: 03/15/2018 1:30 PM Medical Record Number: [id number] Patient Account Number: [id number] Date of Birth/Sex: [birth year] (67 y.o. M) Treating [name]: Montey Hora Primary Care Ciarrah Rae: Royetta Crochet Other Clinician: Referring Lilli Dewald: Royetta Crochet Treating [name]/Extender: Tito Dine in Treatment: 20 Visit Information History Since Last Visit Added or deleted any medications: No Patient Arrived: Ambulatory Any new allergies or adverse reactions: No Arrival Time: 13:39 Had a fall or experienced change in No Accompanied By: self activities of daily living that may affect Transfer Assistance: None risk of falls: Patient Identification Verified: Yes Signs or symptoms of abuse/neglect since last visito No Secondary Verification Process Yes Hospitalized since last visit: No Completed: Implantable device outside of the clinic excluding No Patient Requires Transmission-Based No cellular tissue based products placed in the center Precautions: since last visit: Patient Has Alerts: Yes Has Dressing in Place as Prescribed: Yes Patient Alerts: Patient on Blood Has Compression in Place as Prescribed: Yes Thinner Pain Present Now: Yes DMII warfarin Electronic Signature(s) Signed: 03/15/2018 5:41:21 PM By: Montey Hora Entered By: Montey Hora on 03/15/2018 13:40:14 Dueitt, [name] ([id number]) -------------------------------------------------------------------------------- Encounter Discharge Information Details Patient Name: [name] Date of Service: 03/15/2018 1:30 PM Medical Record Number: [id number] Patient Account Number: [id number] Date of Birth/Sex: 12/06/1950 (67 y.o. M) Treating [name]: Montey Hora Primary Care Zoanne Newill: Royetta Crochet Other Clinician: Referring [place]: Royetta Crochet Treating  Neko Mcgeehan/Extender: Tito Dine in Treatment: 20 Encounter Discharge Information Items Post Procedure Vitals Discharge Condition: Stable Temperature (F): 97.8 Ambulatory Status: Ambulatory Pulse (bpm): 64 Discharge Destination: Home Respiratory Rate (breaths/min): 18 Transportation: Private Auto Blood Pressure (mmHg): 132/70 Accompanied By: self Schedule Follow-up Appointment: Yes Clinical Summary of Care: Electronic Signature(s) Signed: 03/15/2018 5:22:03 PM By: Montey Hora Entered By: Montey Hora on 03/15/2018 17:22:03 Boldon, [name] ([id number]) -------------------------------------------------------------------------------- Lower Extremity Assessment Details Patient Name: [name] Date of Service: 03/15/2018 1:30 PM Medical Record Number: [id number] Patient Account Number: [id number] Date of Birth/Sex: 08-01-1950 (67 y.o. M) Treating [name]: Montey Hora Primary Care Ronia Hazelett: Royetta Crochet Other Clinician: Referring Jaedyn Lard: Royetta Crochet Treating Makalia Bare/Extender: Tito Dine in Treatment: 20 Edema Assessment Assessed: [Left: No] [Right: No] E[Left: dema] [Right: :] Calf Left: Right: Point of Measurement: 38 cm From Medial Instep cm 41.3 cm Ankle Left: Right: Point of Measurement: 14 cm From Medial Instep cm 27.6 cm Vascular Assessment Pulses: Dorsalis Pedis Palpable: [Right:Yes] Posterior Tibial Extremity colors, hair growth, and conditions: Extremity Color: [Right:Hyperpigmented] Hair Growth on Extremity: [Right:No] Temperature of Extremity: [Right:Warm] Capillary Refill: [Right:< 3 seconds] Toe Nail Assessment Left: Right: Thick: Yes Discolored: Yes Deformed: Yes Improper Length and Hygiene: Yes Electronic Signature(s) Signed: 03/15/2018 5:41:21 PM By: Montey Hora Entered By: Montey Hora on 03/15/2018 13:48:49 [name], [name]  ([id number]) -------------------------------------------------------------------------------- Multi Wound Chart Details Patient Name: [name] Date of Service: 03/15/2018 1:30 PM Medical Record Number: [id number] Patient Account Number: [id number] Date of Birth/Sex: 05/19/1950 (67 y.o. 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] [N/A:N/A] Secondary Etiology: [8:Lymphedema] [N/A:N/A] Comorbid History: [8:Chronic Obstructive Pulmonary Disease (COPD), Arrhythmia, Deep Vein Thrombosis,  Hypertension, Type II Diabetes, Gout, Neuropathy] [N/A:N/A] Date Acquired: [8:10/17/2017] [N/A:N/A] Weeks of Treatment: [8:20] [N/A:N/A] Wound Status: [8:Open] [N/A:N/A] Measurements L x W x D [8:3.9x1.8x0.2] [N/A:N/A] (cm) Area (cm) : [8:5.513] [N/A:N/A] Volume (cm) : [8:1.103] [N/A:N/A] % Reduction in Area: [8:-3241.20%] [N/A:N/A] % Reduction in Volume: [8:-6793.70%] [N/A:N/A] Classification: [8:Grade 2] [N/A:N/A] Exudate Amount: [8:Large] [N/A:N/A] Exudate Type: [8:Serous] [N/A:N/A] Exudate Color: [8:amber] [N/A:N/A] Wound Margin: [8:Flat and Intact] [N/A:N/A] Granulation Amount: [8:Large (67-100%)] [N/A:N/A] Granulation Quality: [8:Red, Pink] [N/A:N/A] Necrotic Amount: [8:Small (1-33%)] [N/A:N/A] Exposed Structures: [8:Fat Layer (Subcutaneous Tissue) Exposed: Yes Fascia: No Tendon: No Muscle: No Joint: No Bone: No] [N/A:N/A] Epithelialization: Small (1-33%) N/A  N/A Periwound Skin Texture: Scarring: Yes N/A N/A Excoriation: No Induration: No Callus: No Crepitus: No Rash: No Periwound Skin Moisture: Maceration: No N/A N/A Dry/Scaly: No Periwound Skin Color: Hemosiderin Staining: Yes N/A N/A Atrophie Blanche: No Cyanosis: No Ecchymosis: No Erythema: No Mottled: No Pallor: No Rubor: No Temperature: No Abnormality N/A N/A Tenderness on Palpation: Yes N/A N/A Wound Preparation: Ulcer Cleansing: N/A N/A Rinsed/Irrigated with Saline, Other: soap and water Topical Anesthetic Applied: None, Other: lidocaine 4% Procedures Performed: Cellular or Tissue Based N/A N/A Product Treatment Notes Electronic Signature(s) Signed: 03/15/2018 5:32:05 PM By: Mark Ham MD Entered By: [name] on 03/15/2018 14:14:22 [name], [name] ([id number]) -------------------------------------------------------------------------------- Multi-Disciplinary Care Plan Details Patient Name: [name] Date of Service: 03/15/2018 1:30 PM Medical Record Number: [id number] Patient Account Number: [id number] Date of Birth/Sex: Oct 29, 1950 (67 y.o. M) Treating [name]: [name] Primary Care Deano Tomaszewski: Royetta Crochet Other Clinician: Referring Khyra Viscuso: Royetta Crochet Treating Aviannah Castoro/Extender: Tito Dine in Treatment: 20 Active Inactive ` Abuse / Safety / Falls / Self Care Management Nursing Diagnoses: Potential for falls Goals: Patient [name] not experience any injury related to falls Date Initiated: 10/25/2017 Target Resolution Date: 02/18/2018 Goal Status: Active Interventions: Assess Activities of Daily Living upon admission and as needed Assess fall risk on admission and as needed Assess: immobility, friction, shearing, incontinence upon admission and as needed Assess impairment of mobility on admission and as needed per policy Assess personal safety and home safety (as indicated) on admission and as needed Assess self care  needs on admission and as needed Notes: ` Nutrition Nursing Diagnoses: Imbalanced nutrition Impaired glucose control: actual or potential Potential for alteratiion in Nutrition/Potential for imbalanced nutrition Goals: Patient/caregiver agrees to and verbalizes understanding of need to use nutritional supplements and/or vitamins as prescribed Date Initiated: 10/25/2017 Target Resolution Date: 02/18/2018 Goal Status: Active Patient/caregiver [name] maintain therapeutic glucose control Date Initiated: 10/25/2017 Target Resolution Date: 02/18/2018 Goal Status: Active Interventions: Assess patient nutrition upon admission and as needed per policy Provide education on elevated blood sugars and impact on wound healing Provide education on nutrition [name], [name] ([id number]) Notes: ` Orientation to the Wound Care Program Nursing Diagnoses: Knowledge deficit related to the wound healing center program Goals: Patient/caregiver [name] verbalize understanding of the [facility] Program Date Initiated: 10/25/2017 Target Resolution Date: 11/19/2017 Goal Status: Active Interventions: Provide education on orientation to the wound center Notes: ` Wound/Skin Impairment Nursing Diagnoses: Impaired tissue integrity Knowledge deficit related to ulceration/compromised skin integrity Goals: Ulcer/skin breakdown [name] have a volume reduction of 80% by week 12 Date Initiated: 10/25/2017 Target Resolution Date: 02/11/2018 Goal Status: Active Interventions: Assess patient/caregiver ability to perform ulcer/skin care regimen upon admission and as needed Assess ulceration(s) every visit Notes: Electronic Signature(s) Signed: 03/16/2018 7:26:08 AM By: [name], [name], [name], [name], Kim [name], [name] Entered By: [name], [name], [name], [name], [name] on  03/15/2018 14:06:21 [name], [name] ([id number]) -------------------------------------------------------------------------------- Pain Assessment Details Patient  Name: [name], [name] Date of Service: 03/15/2018 1:30 PM Medical Record Number: [id number] Patient Account Number: [id number] Date of Birth/Sex: 1950-09-10 (67 y.o. M) Treating [name]: Montey Hora Primary Care Ayomide Purdy: Royetta Crochet Other Clinician: Referring Emidio Warrell: Royetta Crochet Treating Shauntel Prest/Extender: Tito Dine in Treatment: 20 Active Problems Location of Pain Severity and Description of Pain Patient Has Paino Yes Site Locations Pain Location: Pain in Ulcers With Dressing Change: Yes Duration of the Pain. Constant / Intermittento Constant Rate the pain. Current Pain Level: 6 Pain Management and Medication Current Pain Management: Electronic Signature(s) Signed: 03/15/2018 5:41:21 PM By: Montey Hora Entered By: Montey Hora on 03/15/2018 13:40:37 [name], [name] ([id number]) -------------------------------------------------------------------------------- Patient/Caregiver Education Details Patient Name: [name] Date of Service: 03/15/2018 1:30 PM Medical Record Number: [id number] Patient Account Number: [id number] Date of Birth/Gender: [birth year] (67 y.o. M) Treating [name]: [name] Primary Care Physician: Royetta Crochet Other Clinician: Referring Physician: Royetta Crochet Treating Physician/Extender: Tito Dine in Treatment: 20 Education Assessment Education Provided To: Patient Education Topics Provided Wound/Skin Impairment: Handouts: Caring for Your Ulcer Methods: Demonstration Responses: State content correctly Electronic Signature(s) Signed: 03/16/2018 7:26:08 AM By: [name], [name], [name], [name], Kim [name], [name] Entered By: [name], [name], [name], [name], [name] on 03/15/2018 17:19:31 [name], [name] ([id number]) -------------------------------------------------------------------------------- Wound Assessment Details Patient Name: [name] Date of Service: 03/15/2018 1:30 PM Medical Record Number:  [id number] Patient Account Number: [id number] Date of Birth/Sex: 1950/06/28 (67 y.o. M) Treating [name]: Montey Hora Primary Care Arnika Larzelere: Royetta Crochet Other Clinician: Referring Yeraldi Fidler: Royetta Crochet Treating Malayshia All/Extender: Tito Dine in Treatment: 20 Wound Status Wound Number: 8 Primary Diabetic Wound/Ulcer of the Lower Extremity Etiology: Wound Location: Right Lower Leg - Medial, Proximal Secondary Lymphedema Wounding Event: Gradually Appeared Etiology: Date Acquired: 10/17/2017 Wound Open Weeks Of Treatment: 20 Status: Clustered Wound: No Comorbid Chronic Obstructive Pulmonary Disease History: (COPD), Arrhythmia, Deep Vein Thrombosis, Hypertension, Type II Diabetes, Gout, Neuropathy Photos Photo Uploaded By: Montey Hora on 03/15/2018 15:06:21 Wound Measurements Length: (cm) 3.9 Width: (cm) 1.8 Depth: (cm) 0.2 Area: (cm) 5.513 Volume: (cm) 1.103 % Reduction in Area: -3241.2% % Reduction in Volume: -6793.7% Epithelialization: Small (1-33%) Tunneling: No Undermining: No Wound Description Classification: Grade 2 Wound Margin: Flat and Intact Exudate Amount: Large Exudate Type: Serous Exudate Color: amber Foul Odor After Cleansing: No Slough/Fibrino Yes Wound Bed Granulation Amount: Large (67-100%) Exposed Structure Granulation Quality: Red, Pink Fascia Exposed: No Necrotic Amount: Small (1-33%) Fat Layer (Subcutaneous Tissue) Exposed: Yes Necrotic Quality: Adherent Slough Tendon Exposed: No Muscle Exposed: No Joint Exposed: No Paul, Hameed ([id number]) Bone Exposed: No Periwound Skin Texture Texture Color No Abnormalities Noted: No No Abnormalities Noted: No Callus: No Atrophie Blanche: No Crepitus: No Cyanosis: No Excoriation: No Ecchymosis: No Induration: No Erythema: No Rash: No Hemosiderin Staining: Yes Scarring: Yes Mottled: No Pallor: No Moisture Rubor: No No Abnormalities Noted: No Dry / Scaly: No  Temperature / Pain Maceration: No Temperature: No Abnormality Tenderness on Palpation: Yes Wound Preparation Ulcer Cleansing: Rinsed/Irrigated with Saline, Other: soap and water, Topical Anesthetic Applied: None, Other: lidocaine 4%, Treatment Notes Wound #8 (Right, Proximal, Medial Lower Leg) 1. Cleansed with: Cleanse wound with antibacterial soap and water 2. Anesthetic Topical Lidocaine 4% cream to wound bed prior to debridement 3. Peri-wound Care: Skin Prep Notes Apligraf applied in clinic today, xtrasorb, ABD, unna to anchor and 3 layer wrap Electronic Signature(s) Signed: 03/15/2018 5:41:21 PM By: Montey Hora Entered By: Montey Hora on  03/15/2018 13:52:05 TERREL, NESHEIWAT ([id number]) -------------------------------------------------------------------------------- Vitals Details Patient Name: [name] Date of Service: 03/15/2018 1:30 PM Medical Record Number: [id number] Patient Account Number: [id number] Date of Birth/Sex: [birth year] (67 y.o. M) Treating [name]: Montey Hora Primary Care Nolita Kutter: Royetta Crochet Other Clinician: Referring Shontez Sermon: Royetta Crochet Treating Dravin Lance/Extender: Tito Dine in Treatment: 20 Vital Signs Time Taken: 13:41 Temperature (F): 97.8 Height (in): 74 Pulse (bpm): 64 Weight (lbs): 233.6 Respiratory Rate (breaths/min): 18 Body Mass Index (BMI): 30 Blood Pressure (mmHg): 132/70 Reference Range: 80 - 120 mg / dl Electronic Signature(s) Signed: 03/15/2018 5:41:21 PM By: Montey Hora Entered By: Montey Hora on 03/15/2018 13:43:53

## 2018-03-18 NOTE — Progress Notes (Signed)
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 the distal thigh to the mid calf level. Successful ablation of the right small saphenous vein.  He had unsuccessful ablation of the right greater saphenous vein from the saphenofemoral junction to the distal 5 level. The patient wears to let her compression stockings and he has been compliant with this With regards to his diabetes he is not currently on any treatment. He does describe pain in his leg which forces him to stop although I couldn't really quantify this. His last arterial studies were on 07/05/16 which showed triphasic waveforms on the right to the level of the popliteal artery. The right posterior tibial and anterior tibial artery were not visualized on this study. I don't actually see ABIs or TBIs. 05/04/17; the patient has small wounds on the right medial heel and the right medial leg. The area on the foot is just about closed. The area on the right medial leg is improved. He is tolerating the 3 layer compression without any complaints. The patient has both known PAD and chronic venous reflux [see discussion above]. 05/11/17; the areas on the patient's medial right heel with healed he still has an open area on the right medial leg. This does not require debridement today I think I did read it last week. We are using silver collagen under 3 layer compression. 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 He has been tolerating the dressing changes without complication. With that being said he does have some epithelialization growing into the wound bed and it appears this wound may be trying to healed in a depressed fashion. To be honest that is not the worst case scenario and I think this may definitely do fine even if it heals in that way. There was no requirement for debridement today as patient appears to be doing very well in regard to his ulcer. His swelling was a little bit more severe than last week but  nothing too significant. 07/05/17 on evaluation today patient appears to be doing rather well in regard to his right medial lower extremity ulcer. The wound does appear to be healing although it is healing and somewhat of a cratered fashion the good news is he has excellent epithelium noted. Fortunately there does not appear to be any evidence of infection at this point he is tolerating the dressings as well is the wrap well. 07/12/17 on evaluation today patient appears to be doing very well in regard to his left medial lower  extremity ulcer. He has been tolerating the dressing changes without complication. In fact compared to last week this wound which was somewhat concave has fielding dramatically and looks to be doing excellent. I'm extremely happy with the progress. He still has pain rated to be a 4-5/10 but the wound looks great. 07/20/17; the patient's right medial lower extremity ulcer is closed. He has good edema control. Unfortunately he does not really have adequate compression stockings. 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 He has been treated with compression stockings for some time now in fact we have notes having treated him even as far back as 2016 here in our clinic. Fortunately he has excellent blood flow and tends to heal well. Unfortunately despite wearing his compression stockings, keeping his legs elevated, being on fluid pills (Lasix) and doing everything he can to try to help with fluid he still continues to have issues with this reopening. I think that he may be a good candidate for compression/lymphedema pumps. We have never attempted to get these for  him previously. No fevers, chills, nausea, or vomiting noted at this time. Currently patient states his ulcer does hurt although not as bad as some in the past it's also not as large as it has been in the past when it reopened he talked is somewhat early. Obviously this is good news. 12/13/17 on evaluation today patient appears to actually be doing very well in regard to his right medial ankle ulcer compared to last week's evaluation. I think having him on the correct antibiotics has made all the difference in the world. With that being said he's not having discomfort like he was previous and in general I feel like he has made excellent strides towards healing in the past week. No fevers, chills, nausea, or vomiting noted at this time. LOUAY, MYRIE ([id number]) 01/10/18 on evaluation today patient appears to be doing a little worse compared to last week's evaluation. I think that though the Iodoflex has help with cleaning up the surface of the wound subsequently he has having a lot of drainage which I think is causing some issues in that regard. At this point I think we may want to switch the dressings. 01/17/18 on evaluation today patient appears to be doing rather well in regard to his right medial malleolus ulcer. The good news is I do feel like he showing some signs of improvement which is excellent. I feel like that the [facility] Dressing been better for him than the Iodoflex that we will previously utilizing. He is having much less in the way of discomfort which is good news. Overall very pleased in that regard. Nonetheless the appearance of the wound is improved today as well and that is excellent news. He still has pain but not seemingly as much as previous and I don't feel like there's much maceration either. 01/23/18 on evaluation today patient appears in our clinic a day early for his appointment due to the fact that he was having discomfort and issues with his wrap. Upon inspection  of the wound actually appears to be doing fairly well which is good news. With that being said he is having a lot of  discomfort at this point unfortunately. I think that this is mainly due to the fact that he is having such drainage as the wound bed itself seems to be doing well. I do believe the [facility] Dressing well is helpful for him which is good news. Nonetheless again I think he may need more frequent dressing changes. 01/31/18 on evaluation today patient actually appears to be doing very well in regard to his right medial ankle ulcer. This is significantly better than what was even noted last week I do think that the extra dressing change with the nurse visit toward the end of the week made a significant improvement for him. Overall I'm very pleased with how things appear today. 02/07/18 on evaluation today patient actually appears to be doing a little better in regard to his wound. The measurements are smaller today compared to previous. Fortunately there's no evidence of any infection and I do believe the moisture is much better control with more frequent wrap changes. Overall the patient seems to be making good progress. 02/14/18 on evaluation today patient actually appears to be doing very well in regard to his medial ankle ulcer of the right lower extremity. With that being said he saw slough buildup and this is still been somewhat slow to heal. For that reason my suggestion currently is gonna be that we may need to see about checking on a skin substitute possibly Apligraf to see if this will be of benefit for him. In the past he has benefited from Apligraf. Overall there does not appear to be any evidence of infection at this time which is definitely good news. 02/28/18 on evaluation today patient's medial ankle ulcer on the right actually appears to be doing significantly better compared to previous. He has been tolerating the dressing changes without complication. Fortunately there  appears to be no evidence of infection which is great news. Overall I do feel like that he has made improvement although there has been some delay in general and the wound is taken a little longer than I would've liked up to this point. Nonetheless there's no sign of infection and therefore I think that we can proceed with the Apligraf applications we actually got this approved for him. That something that I'm gonna see about ordering for next week. 03/07/18 on evaluation today patient unfortunately appears to be doing significantly worse than normal as far as his overall and general appearance is concerned. He has been tolerating the dressing changes without complication. With that being said nothing appears to be problematic in regard to his wound. The issue that I see right now involves more his general malaise and the fact that he does appear to have atrial fibrillation which is a known issue but unfortunately he is dropping low to the point of having 3-5 seconds of sustained pulses that are around 30 bpm. This was confirmed actually during evaluation today by myself. He's been having left lower quadrant pain and was actually in the emergency department on 17 October most recently. According to the notes reviewed it did not appear that anything was identified as far as a cause of the reason for this. It does not appear to the wound related which is good news. Nonetheless still I'm unsure of exactly what is going on but I am worried on top of everything else and the patient feeling lethargic he showed up late for his appointment today which is very uncharacteristic for this gentleman. 03/15/18; the patient was sent to the ER  last week during his evaluation but he was not admitted. I have not reviewed the emergency room record. He states he feels better. He is here for application of Apligraf #1. The Apligraf we had would've expired tomorrow so he was brought in early [place], [place]  ([id number]) Objective Constitutional Sitting or standing Blood Pressure is within target range for patient.. Pulse regular and within target range for patient.[name] Respirations regular, non-labored and within target range.. Temperature is normal and within the target range for the patient.[name] appears in no distress. Vitals Time Taken: 1:41 PM, Height: 74 in, Weight: 233.6 lbs, BMI: 30, Temperature: 97.8 F, Pulse: 64 bpm, Respiratory Rate: 18 breaths/min, Blood Pressure: 132/70 mmHg. General Notes: Wound exam; 2 small punched out wounds on the right medial calf. These have some depth. Surrounding damaged skin secondary to hemosiderin deposition from venous insufficiency. His edema is well controlled. No debridement was necessary. Apligraf #1 applied in standard fashion. Integumentary (Hair, Skin) Wound #8 status is Open. Original cause of wound was Gradually Appeared. The wound is located on the Right,Proximal,Medial Lower Leg. The wound measures 3.9cm length x 1.8cm width x 0.2cm depth; 5.513cm^2 area and 1.103cm^3 volume. There is Fat Layer (Subcutaneous Tissue) Exposed exposed. There is no tunneling or undermining noted. There is a large amount of serous drainage noted. The wound margin is flat and intact. There is large (67-100%) red, pink granulation within the wound bed. There is a small (1-33%) amount of necrotic tissue within the wound bed including Adherent Slough. The periwound skin appearance exhibited: Scarring, Hemosiderin Staining. The periwound skin appearance did not exhibit: Callus, Crepitus, Excoriation, Induration, Rash, Dry/Scaly, Maceration, Atrophie Blanche, Cyanosis, Ecchymosis, Mottled, Pallor, Rubor, Erythema. Periwound temperature was noted as No Abnormality. The periwound has tenderness on palpation. 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 A skin graft procedure using a bioengineered skin substitute/cellular or tissue based product was performed by Ricard Dillon, MD with the following instrument(s): Forceps. Apligraf was applied and secured with Steri-Strips. 7 sq cm of product was utilized and 37 sq cm was wasted due to wound size. Post Application, mepitel one was applied. A Time Out was conducted at 14:07, prior to the start of the procedure. The procedure was tolerated well with a pain level of 0 throughout and a pain level of 0 following the procedure. Post procedure Diagnosis Wound #8: Same as Pre-Procedure . Pollak, [name] ([id number]) Apligraf #1 applied in the standard fashion. He'll be back next week for a nurse visit and then he'll be seen by a provider in 2 weeks time. Plan Wound Cleansing: Wound #8 Right,Proximal,Medial Lower Leg: Clean wound with Normal Saline. Cleanse wound with mild soap and water Anesthetic (add to Medication List): Wound #8 Right,Proximal,Medial Lower Leg: Topical Lidocaine 4% cream applied to wound bed prior to debridement (In Clinic Only). Skin Barriers/Peri-Wound Care: Wound #8 Right,Proximal,Medial Lower Leg: Moisturizing lotion - not on wound area Primary Wound Dressing: XtraSorb Secondary Dressing: ABD pad Dressing Change Frequency: Wound #8 Right,Proximal,Medial Lower Leg: Change dressing every week Follow-up Appointments: Wound #8 Right,Proximal,Medial Lower Leg: Return Appointment in 1 week. - Nurse Visit Nurse Visit as needed Edema Control: Wound #8 Right,Proximal,Medial Lower Leg: 3 Layer Compression System - Right  Lower Extremity - unna to anchor Additional Orders / Instructions: Wound #8 Right,Proximal,Medial Lower  Leg: Increase protein intake. Advanced Therapies: Wound #8 Right,Proximal,Medial Lower Leg: Apligraf application in clinic; including contact layer, fixation with steri strips, dry gauze and cover dressing. #1 Apligraf #1 applied #2 continue 3 layer compression Electronic Signature(s) Signed: 03/15/2018 5:32:05 PM By: [name] MD Entered By: [name] on 03/15/2018 14:20:10 Nickle, [name] ([id number]) -------------------------------------------------------------------------------- SuperBill Details Patient Name: [name] Date of Service: 03/15/2018 Medical Record Number: [id number] Patient Account Number: [id number] Date of Birth/Sex: 05/30/1950 (67 y.o. M) Treating RN: Cornell Barman Primary Care Provider: Royetta Crochet Other Clinician: Referring Provider: Royetta Crochet Treating Provider/Extender: Tito Dine in Treatment: 20 Diagnosis Coding ICD-10 Codes Code Description I87.2 Venous insufficiency (chronic) (peripheral) I89.0 Lymphedema, not elsewhere classified E11.622 Type 2 diabetes mellitus with other skin ulcer L97.812 Non-pressure chronic ulcer of other part of right lower leg with fat layer exposed Facility Procedures CPT4 Code Description: [id number] Q4101 (Facility Use Only) Apligraf 44 SQ CM Modifier: Quantity: 55 CPT4 Code Description: 21747159 15271 - SKIN SUB GRAFT TRNK/ARM/LEG ICD-10 Diagnosis Description B39.672 Non-pressure chronic ulcer of other part of right lower leg wit Modifier: h fat layer expo Quantity: 1 sed Physician Procedures CPT4 Code Description: 8979150 15271 - WC PHYS SKIN SUB GRAFT TRNK/ARM/LEG ICD-10 Diagnosis Description C13.643 Non-pressure chronic ulcer of other part of right lower leg with Modifier: fat layer expo Quantity: 1 sed Electronic Signature(s) Signed: 03/15/2018 5:32:05 PM By: [name] MD Entered By: [name] on 03/15/2018 14:20:37

## 2018-03-21 ENCOUNTER — Ambulatory Visit: Payer: Medicare Other

## 2018-03-22 ENCOUNTER — Encounter: Payer: Medicare Other | Attending: Internal Medicine

## 2018-03-22 DIAGNOSIS — I4891 Unspecified atrial fibrillation: Secondary | ICD-10-CM | POA: Diagnosis not present

## 2018-03-22 DIAGNOSIS — E11621 Type 2 diabetes mellitus with foot ulcer: Secondary | ICD-10-CM | POA: Insufficient documentation

## 2018-03-22 DIAGNOSIS — E1140 Type 2 diabetes mellitus with diabetic neuropathy, unspecified: Secondary | ICD-10-CM | POA: Insufficient documentation

## 2018-03-22 DIAGNOSIS — I872 Venous insufficiency (chronic) (peripheral): Secondary | ICD-10-CM | POA: Diagnosis not present

## 2018-03-22 DIAGNOSIS — E11622 Type 2 diabetes mellitus with other skin ulcer: Secondary | ICD-10-CM | POA: Insufficient documentation

## 2018-03-22 DIAGNOSIS — L97919 Non-pressure chronic ulcer of unspecified part of right lower leg with unspecified severity: Secondary | ICD-10-CM | POA: Insufficient documentation

## 2018-03-22 DIAGNOSIS — F17210 Nicotine dependence, cigarettes, uncomplicated: Secondary | ICD-10-CM | POA: Insufficient documentation

## 2018-03-22 DIAGNOSIS — Z86711 Personal history of pulmonary embolism: Secondary | ICD-10-CM | POA: Diagnosis not present

## 2018-03-22 DIAGNOSIS — Z8249 Family history of ischemic heart disease and other diseases of the circulatory system: Secondary | ICD-10-CM | POA: Diagnosis not present

## 2018-03-22 DIAGNOSIS — Z86718 Personal history of other venous thrombosis and embolism: Secondary | ICD-10-CM | POA: Insufficient documentation

## 2018-03-22 DIAGNOSIS — J449 Chronic obstructive pulmonary disease, unspecified: Secondary | ICD-10-CM | POA: Diagnosis not present

## 2018-03-22 DIAGNOSIS — E1151 Type 2 diabetes mellitus with diabetic peripheral angiopathy without gangrene: Secondary | ICD-10-CM | POA: Insufficient documentation

## 2018-03-22 DIAGNOSIS — Z823 Family history of stroke: Secondary | ICD-10-CM | POA: Insufficient documentation

## 2018-03-22 DIAGNOSIS — F1410 Cocaine abuse, uncomplicated: Secondary | ICD-10-CM | POA: Diagnosis not present

## 2018-03-22 DIAGNOSIS — M109 Gout, unspecified: Secondary | ICD-10-CM | POA: Insufficient documentation

## 2018-03-22 DIAGNOSIS — Z7901 Long term (current) use of anticoagulants: Secondary | ICD-10-CM | POA: Insufficient documentation

## 2018-03-22 DIAGNOSIS — Z8673 Personal history of transient ischemic attack (TIA), and cerebral infarction without residual deficits: Secondary | ICD-10-CM | POA: Insufficient documentation

## 2018-03-22 DIAGNOSIS — I10 Essential (primary) hypertension: Secondary | ICD-10-CM | POA: Insufficient documentation

## 2018-03-24 NOTE — Progress Notes (Addendum)
GRYPHON, VANDERVEEN ([id number]) Visit Report for 03/22/2018 Arrival Information Details Patient Name: [name], [name] Date of Service: 03/22/2018 8:30 AM Medical Record Number: [id number] Patient Account Number: [id number] Date of Birth/Sex: 10/27/1950 (67 y.o. M) Treating RN: Montey Hora Primary Care Jaylee Lantry: Royetta Crochet Other Clinician: Referring Inocencio Roy: Royetta Crochet Treating Phillippa Straub/Extender: Tito Dine in Treatment: 21 Visit Information History Since Last Visit Added or deleted any medications: No Patient Arrived: Ambulatory Any new allergies or adverse reactions: No Arrival Time: 08:23 Had a fall or experienced change in No Accompanied By: self activities of daily living that may affect Transfer Assistance: None risk of falls: Patient Identification Verified: Yes Signs or symptoms of abuse/neglect since last visito No Secondary Verification Process Yes Hospitalized since last visit: No Completed: Implantable device outside of the clinic excluding No Patient Requires Transmission-Based No cellular tissue based products placed in the center Precautions: since last visit: Patient Has Alerts: Yes Has Dressing in Place as Prescribed: Yes Patient Alerts: Patient on Blood Has Compression in Place as Prescribed: Yes Thinner Pain Present Now: Yes DMII warfarin Electronic Signature(s) Signed: 03/22/2018 9:51:09 AM By: Montey Hora Entered By: Montey Hora on 03/22/2018 09:51:09 Lowenstein, [name] ([id number]) -------------------------------------------------------------------------------- Compression Therapy Details Patient Name: [name] Date of Service: 03/22/2018 8:30 AM Medical Record Number: [id number] Patient Account Number: [id number] Date of Birth/Sex: 29-Oct-1950 (67 y.o. M) Treating RN: Montey Hora Primary Care Clay Menser: Royetta Crochet Other Clinician: Referring Davien Malone: Royetta Crochet Treating Averyanna Sax/Extender: Tito Dine in Treatment: 21 Compression Therapy Performed for Wound Assessment: Wound #8 Right,Proximal,Medial Lower Leg Performed By: Clinician Montey Hora, RN Compression Type: Three Layer Pre Treatment ABI: 1.2 Electronic Signature(s) Signed: 03/22/2018 9:52:59 AM By: Montey Hora Entered By: Montey Hora on 03/22/2018 09:52:58 Rickles, [name] ([id number]) -------------------------------------------------------------------------------- Encounter Discharge Information Details Patient Name: [name] Date of Service: 03/22/2018 8:30 AM Medical Record Number: [id number] Patient Account Number: [id number] Date of Birth/Sex: [birth year] (67 y.o. M) Treating RN: Montey Hora Primary Care Kaleena Corrow: Royetta Crochet Other Clinician: Referring Berdena Cisek: Royetta Crochet Treating Kele Barthelemy/Extender: Tito Dine in Treatment: 21 Encounter Discharge Information Items Discharge Condition: Stable Ambulatory Status: Ambulatory Discharge Destination: Home Transportation: Private Auto Accompanied By: self Schedule Follow-up Appointment: Yes Clinical Summary of Care: Electronic Signature(s) Signed: 03/22/2018 9:54:12 AM By: Montey Hora Entered By: Montey Hora on 03/22/2018 09:54:12 Soler, [name] ([id number]) -------------------------------------------------------------------------------- Patient/Caregiver Education Details Patient Name: [name] Date of Service: 03/22/2018 8:30 AM Medical Record Number: [id number] Patient Account Number: [id number] Date of Birth/Gender: June 19, 1950 (67 y.o. M) Treating RN: Montey Hora Primary Care Physician: Royetta Crochet Other Clinician: Referring Physician: Royetta Crochet Treating Physician/Extender: Tito Dine in Treatment: 21 Education Assessment Education Provided To: Patient Education Topics Provided Venous: Handouts: Other: wrap precautions Methods: Explain/Verbal Responses:  State content correctly Electronic Signature(s) Signed: 03/23/2018 5:01:53 PM By: Montey Hora Entered By: Montey Hora on 03/22/2018 09:53:59 Lingenfelter, [name] ([id number]) -------------------------------------------------------------------------------- Wound Assessment Details Patient Name: [name] Date of Service: 03/22/2018 8:30 AM Medical Record Number: [id number] Patient Account Number: [id number] Date of Birth/Sex: [birth year] (67 y.o. M) Treating RN: Montey Hora Primary Care Vere Diantonio: Royetta Crochet Other Clinician: Referring Dameir Gentzler: Royetta Crochet Treating Katrese Shell/Extender: Tito Dine in Treatment: 21 Wound Status Wound Number: 8 Primary Diabetic Wound/Ulcer of the Lower Extremity Etiology: Wound Location: Right Lower Leg - Medial, Proximal Secondary Lymphedema Wounding Event: Gradually Appeared Etiology: Date Acquired: 10/17/2017 Wound Open Weeks Of Treatment: 21 Status: Clustered Wound: No Comorbid Chronic Obstructive Pulmonary Disease History: (COPD), Arrhythmia, Deep Vein Thrombosis,  Hypertension, Type II Diabetes, Gout, Neuropathy Photos Photo Uploaded By: Montey Hora on 03/22/2018 10:17:41 Wound Measurements Length: (cm) 3.9 Width: (cm) 1.8 Depth: (cm) 0.2 Area: (cm) 5.513 Volume: (cm) 1.103 % Reduction in Area: -3241.2% % Reduction in Volume: -6793.7% Epithelialization: Small (1-33%) Tunneling: No Undermining: No Wound Description Classification: Grade 2 Wound Margin: Flat and Intact Exudate Amount: Large Exudate Type: Serous Exudate Color: amber Foul Odor After Cleansing: No Slough/Fibrino Yes Wound Bed Granulation Amount: Large (67-100%) Exposed Structure Granulation Quality: Red, Pink Fascia Exposed: No Necrotic Amount: Small (1-33%) Fat Layer (Subcutaneous Tissue) Exposed: Yes Necrotic Quality: Adherent Slough Tendon Exposed: No Muscle Exposed: No Joint Exposed: No Shelvin, Tanor  ([id number]) Bone Exposed: No Periwound Skin Texture Texture Color No Abnormalities Noted: No No Abnormalities Noted: No Callus: No Atrophie Blanche: No Crepitus: No Cyanosis: No Excoriation: No Ecchymosis: No Induration: No Erythema: No Rash: No Hemosiderin Staining: Yes Scarring: Yes Mottled: No Pallor: No Moisture Rubor: No No Abnormalities Noted: No Dry / Scaly: No Temperature / Pain Maceration: No Temperature: No Abnormality Tenderness on Palpation: Yes Wound Preparation Ulcer Cleansing: Rinsed/Irrigated with Saline, Other: soap and water, Topical Anesthetic Applied: None Treatment Notes Wound #8 (Right, Proximal, Medial Lower Leg) Notes Apligraf applied in clinic last week, xtrasorb, unna to anchor and 3 layer wrap Electronic Signature(s) Signed: 03/22/2018 9:51:50 AM By: Montey Hora Entered By: Montey Hora on 03/22/2018 09:51:50

## 2018-03-28 ENCOUNTER — Encounter: Payer: Medicare Other | Admitting: Physician Assistant

## 2018-03-28 DIAGNOSIS — E11621 Type 2 diabetes mellitus with foot ulcer: Secondary | ICD-10-CM | POA: Diagnosis not present

## 2018-03-30 NOTE — Progress Notes (Signed)
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 TheraSkin #2 08/17/16- the patient is here for follow-up of a attenuation of his right lower Schmeltzer. He is status post 2 TheraSkin applications and he states he has an appointment for venous  ablation with Dr.Dew on 4/13. 08/31/16; the patient had laser ablation by Dr. dew on 4/13. I think this involved both the greater and lesser saphenous veins. He tolerated this well. We have been putting TheraSkin on the wound every 2 weeks and he arrives with better-looking epithelialization today 09/14/16; the patient arrives today with an odor to his wound and some greenish necrotic surface over the wound approximately 70%. He had a small satellite lesion noted last week when we changed his dressing in between application of TheraSkin. I elected not to put that TheraSkin on today. 09/21/16; deterioration in the wound last week. I gave him empiric Cefdinir out of fear for a gram-negative infection although the CULTURE turned out to be negative. He completed his antibiotics this morning. Wound looks somewhat better, I put silver alginate on it last week again out of concern for infection. We do not have a TheraSkin this week not ordered last week 09/28/16; no major change from last week. We've looked over the volume of this wound and of not had major changes in spite of TheraSkin although the last TheraSkin was almost a month ago. We put silver alginate on last week out of fear of infection. I will switch to [facility] as looking over the records didn't really suggest that theraskin had helped 10/12/16; we'll use Hydrofera Blue starting last week. No major change in the wound dimensions. 10/19/16; continue Hydrofera Blue. 1.8 x 2.5 x 0.2. Wound base looks healthy 10/26/16; continue with Hydrofera Blue. 1.5 x 2.4 x 0.2 11/02/16 no change in dimensions. Change from [facility] to Iodoflex 11/09/16; patient complains of increasing pain. Dimensions slightly larger. Last week I put Iodoflex on this wound to see if we can get a better surface I also increased him to 4 layer compression. He has not been systemically unwell. 11/16/16; patient states his leg feels better. He has completed his antibiotics.  Dimensions are better. I change back to [facility] last week. We also change the dressing once on Friday which may have helped. Wound looks a lot better today than last week.. 2.2 x 2.4 x 0.3 11/23/16 on evaluation today patient's right lower extremity ulcer appears to be doing very well. He has been tolerating the [facility] Dressing and tells me that fortunately he is finally improvement. He is pleased with how this is progressing. XYLAN, SHEILS ([id number]) 11/30/16; improved using Hydrofera Blue 12/07/16; continue dramatic progress. Wound is now small and healthy looking. Continue use of Hydrofera Blue 12/14/16; very small wound albeit with some depth. Continued use of Hydrofera blue. 12/21/16 on evaluation today patient's right lower extremity ulcer appears healed at this point. He is having no discomfort and overall this is doing very well. And there's no sign of infection. 04/26/17; READMISSION Patient we know from previous stays in this clinic. The patient has known diabetic PAD and has a stent in the right leg. 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 When he was last in the clinic in August we had healed him out for what was felt to be a mostly venous insufficiency wound on the medial right leg. He follows with vascular surgery Dr.  dew is at [place] [facility]. He has previously undergone successful laser ablation. Reflux studies on 4/24 showed reflux in the common femoral, femoral and popliteal. He underwent successful ablation of the right greater saphenous vein from the distal thigh to the mid calf level. Successful ablation of the right small saphenous vein. He had unsuccessful ablation of the right greater saphenous vein from the saphenofemoral junction to the distal 5 level. The patient wears to let her compression stockings and he has been compliant with this With regards to his diabetes he is not currently on any treatment. He  does describe pain in his leg which forces him to stop although I couldn't really quantify this. His last arterial studies were on 07/05/16 which showed triphasic waveforms on the right to the level of the popliteal artery. The right posterior tibial and anterior tibial artery were not visualized on this study. I don't actually see ABIs or TBIs. 05/04/17; the patient has small wounds on the right medial heel and the right medial leg. The area on the foot is just about closed. The area on the right medial leg is improved. He is tolerating the 3 layer compression without any complaints. The patient has both known PAD and chronic venous reflux [see discussion above]. 05/11/17; the areas on the patient's medial right heel with healed he still has an open area on the right medial leg. This does not require debridement today I think I did read it last week. We are using silver collagen under 3 layer compression. 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 He has been tolerating the dressing changes without complication. With that being said he does have some epithelialization growing into the wound bed and it appears this wound may be trying to healed in a depressed fashion. To be honest that is not the worst case scenario and I think this may definitely do fine even if it heals in that way. There was no requirement for debridement today as patient appears to be doing very well in regard to his ulcer. His swelling was a little bit more severe than last week but nothing too significant. 07/05/17 on evaluation today patient appears to be doing rather well in regard to his right medial lower extremity ulcer. The wound does appear to be healing although it is healing and somewhat of a cratered fashion the good news is he has  excellent epithelium noted. Fortunately there does not appear to be any evidence of infection at this point he is tolerating the dressings as well is the wrap well. 07/12/17 on evaluation today patient appears to be doing very well in regard to his left medial lower extremity ulcer. He has been tolerating the dressing changes without complication. In fact compared to last week this wound which was somewhat concave has fielding dramatically and looks to be doing excellent. I'm extremely happy with the progress. He still has pain rated to be a 4-5/10 but the wound looks great. 07/20/17; the patient's right medial lower extremity ulcer is closed. He has good edema control. Unfortunately he does not really have adequate compression stockings. 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 He has been treated with compression stockings for some time now in fact we have notes having treated him even as far back as 2016 here in our clinic. Fortunately he has excellent blood flow and tends to heal well. Unfortunately despite wearing his compression stockings, keeping his legs elevated, being on fluid pills (Lasix) and doing everything he can to try to help with fluid he still continues to have issues with this reopening. I think that he may be a good candidate for compression/lymphedema pumps. We have never attempted to get these for him previously. No fevers, chills, nausea, or vomiting noted at this time. Currently patient states his ulcer does hurt although not as bad as some in the past it's also not as large as it has been in the past when it reopened he talked is somewhat early. Obviously  this is good news. ODIE, EDMONDS ([id number]) 12/13/17 on evaluation today patient appears to actually be doing very well in regard to his right medial ankle ulcer compared to last week's evaluation. I think having him on the correct antibiotics has made all the difference in the world. With that being said he's not having discomfort like he was previous and in general I feel like he has made excellent strides towards healing in the past week. No fevers, chills, nausea, or vomiting noted at this time. 01/10/18 on evaluation today patient appears to be doing a little worse compared to last week's evaluation. I think that though the Iodoflex has help with cleaning up the surface of the wound subsequently he has having a lot of drainage which I think is causing some issues in that regard. At this point I think we may want to switch the dressings. 01/17/18 on evaluation today patient appears to be doing rather well in regard to his right medial malleolus ulcer. The good news is I do feel like he showing some signs of improvement which is excellent. I feel like that the [facility] Dressing been better for him than the Iodoflex that we will previously utilizing. He is having much less in the way of discomfort which is good news. Overall very pleased in that regard. Nonetheless the appearance of the wound is improved today as well and that is excellent news. He still has pain but not seemingly as much as previous and I don't feel like there's much maceration either. 01/23/18 on evaluation today patient appears in our clinic a day early for his appointment due to the fact that he was having discomfort and issues with his wrap. Upon inspection of the wound actually appears to be doing fairly well which is good news. With that being said he is having a lot of discomfort at this point unfortunately. I think that this is mainly due to the fact that he is having such drainage as the wound bed itself seems to  be doing well. I do believe the [facility] Dressing well is helpful for him which is good news. Nonetheless again I think he may need more frequent dressing changes. 01/31/18 on evaluation today patient actually appears to be doing very well in regard to his right medial ankle ulcer. This is significantly better than what was even noted last week I do think that the extra dressing change with the nurse visit toward the end of the week made a significant improvement for him. Overall I'm very pleased with how  things appear today. 02/07/18 on evaluation today patient actually appears to be doing a little better in regard to his wound. The measurements are smaller today compared to previous. Fortunately there's no evidence of any infection and I do believe the moisture is much better control with more frequent wrap changes. Overall the patient seems to be making good progress. 02/14/18 on evaluation today patient actually appears to be doing very well in regard to his medial ankle ulcer of the right lower extremity. With that being said he saw slough buildup and this is still been somewhat slow to heal. For that reason my suggestion currently is gonna be that we may need to see about checking on a skin substitute possibly Apligraf to see if this will be of benefit for him. In the past he has benefited from Apligraf. Overall there does not appear to be any evidence of infection at this time which is definitely good news. 02/28/18 on evaluation today patient's medial ankle ulcer on the right actually appears to be doing significantly better compared to previous. He has been tolerating the dressing changes without complication. Fortunately there appears to be no evidence of infection which is great news. Overall I do feel like that he has made improvement although there has been some delay in general and the wound is taken a little longer than I would've liked up to this point. Nonetheless there's no sign  of infection and therefore I think that we can proceed with the Apligraf applications we actually got this approved for him. That something that I'm gonna see about ordering for next week. 03/07/18 on evaluation today patient unfortunately appears to be doing significantly worse than normal as far as his overall and general appearance is concerned. He has been tolerating the dressing changes without complication. With that being said nothing appears to be problematic in regard to his wound. The issue that I see right now involves more his general malaise and the fact that he does appear to have atrial fibrillation which is a known issue but unfortunately he is dropping low to the point of having 3-5 seconds of sustained pulses that are around 30 bpm. This was confirmed actually during evaluation today by myself. He's been having left lower quadrant pain and was actually in the emergency department on 17 October most recently. According to the notes reviewed it did not appear that anything was identified as far as a cause of the reason for this. It does not appear to the wound related which is good news. Nonetheless still I'm unsure of exactly what is going on but I am worried on top of everything else and the patient feeling lethargic he showed up late for his appointment today which is very uncharacteristic for this gentleman. 03/15/18; the patient was sent to the ER last week during his evaluation but he was not admitted. I have not reviewed the emergency room record. He states he feels better. He is here for application of Apligraf #1. The Apligraf we had would've expired tomorrow so he was brought in early 03/28/18 on evaluation today patient actually appears to be doing well in my pinion in regard to the overall appearance of his right lower [place] ulcer. He has been tolerating the dressing changes without complication. Fortunately there does not appear to be any evidence of infection at this  time which is also good news. He does seem to have tolerated the first Apligraf Clowers, Colton ([id number]) application. He is here for number two today.  Patient History Information obtained from Patient. Family History Heart Disease - Mother,Father, Hypertension - Mother,Father, Stroke - Father, No family history of Cancer, Diabetes, Hereditary Spherocytosis, Thyroid Problems, Tuberculosis. Social History Current every day smoker, Marital Status - Single, Alcohol Use - Never, Drug Use - No History, Caffeine Use - Daily. Medical History Hospitalization/Surgery History - 05/17/2012, [facility], CVA. - 03/17/2017, [facility], Pneumonia. Medical And Surgical History Notes Ear/Nose/Mouth/Throat difficulty speaking Respiratory Hospitalized for Pneumonia 02/2017 Neurologic CVA in 2014 Review of Systems (ROS) Constitutional Symptoms (General Health) Denies complaints or symptoms of Fever, Chills. Respiratory The patient has no complaints or symptoms. Cardiovascular Complains or has symptoms of LE edema. Psychiatric The patient has no complaints or symptoms. Objective Constitutional Well-nourished and well-hydrated in no acute distress. Vitals Time Taken: 8:00 AM, Height: 74 in, Weight: 233.6 lbs, BMI: 30, Temperature: 97.7 F, Pulse: 51 bpm, Respiratory Rate: 18 breaths/min, Blood Pressure: 157/80 mmHg. Respiratory normal breathing without difficulty. Psychiatric this patient is able to make decisions and demonstrates good insight into disease process. Alert and Oriented x 3. pleasant and cooperative. [name], [name] ([id number]) General Notes: Patient's wound bed currently did require sharp debridement to clear away the necrotic tissue from the surface of the wound. He tolerated this debridement today without complication post debridement the wound appears to be doing significantly better which is great news. Overall very pleased with how things progressed. Subsequently the  second Apligraf application was applied today in the clinic. This was secured down with [employer] followed by Steri-Strips. The compression wraps was then reapplied. Integumentary (Hair, Skin) Wound #8 status is Open. Original cause of wound was Gradually Appeared. The wound is located on the Right,Proximal,Medial Lower Leg. The wound measures 4cm length x 2cm width x 0.2cm depth; 6.283cm^2 area and 1.257cm^3 volume. There is Fat Layer (Subcutaneous Tissue) Exposed exposed. There is no tunneling or undermining noted. There is a large amount of serous drainage noted. The wound margin is flat and intact. There is medium (34-66%) red, pink granulation within the wound bed. There is a medium (34-66%) amount of necrotic tissue within the wound bed including Adherent Slough. The periwound skin appearance exhibited: Scarring, Hemosiderin Staining. The periwound skin appearance did not exhibit: Callus, Crepitus, Excoriation, Induration, Rash, Dry/Scaly, Maceration, Atrophie Blanche, Cyanosis, Ecchymosis, Mottled, Pallor, Rubor, Erythema. Periwound temperature was noted as No Abnormality. The periwound has tenderness on palpation. 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 There was a Excisional Skin/Subcutaneous Tissue Debridement with a total area of 8 sq cm performed by STONE III, Carmin Dibartolo E., PA-C. With the following instrument(s): Curette to remove Viable and Non-Viable tissue/material. Material removed includes Subcutaneous Tissue and Slough and after achieving pain control using Lidocaine 4%  Topical Solution. No specimens were taken. A time out was conducted at 08:21, prior to the start of the procedure. A Minimum amount of bleeding was controlled with Pressure. The procedure was tolerated well with a pain level of 0 throughout and a pain level of 0 following the procedure. Post Debridement Measurements: 4cm length x 2cm width x 0.3cm depth; 1.885cm^3 volume. Character of Wound/Ulcer Post Debridement is improved. Severity of Tissue Post Debridement is: Fat layer exposed. Post procedure  Diagnosis Wound #8: Same as Pre-Procedure Pre-procedure diagnosis of Wound #8 is a Diabetic Wound/Ulcer of the Lower Extremity located on the Right,Proximal,Medial Lower Leg. A skin graft procedure using a bioengineered skin substitute/cellular or tissue based product was performed by STONE III, Tomia Enlow E., PA-C with the following instrument(s): Forceps and Scissors. Apligraf was applied and secured with Steri-Strips. 8 sq cm of product was utilized and 36 sq cm was wasted due to wound size. Post Application, mepitel one was applied. A Time Out was conducted at 08:27, prior to the start of the procedure. The procedure was tolerated well with a pain level of 0 throughout and a pain level of 0 following the procedure. Post procedure Diagnosis Wound #8: Same as Pre-Procedure Mamula, Antwain ([id number]) . Plan Wound Cleansing: Wound #8 Right,Proximal,Medial Lower Leg: Clean wound with Normal Saline. Cleanse wound with mild soap and water Anesthetic (add to Medication List): Wound #8 Right,Proximal,Medial Lower Leg: Topical Lidocaine 4% cream applied to wound bed prior to debridement (In Clinic Only). Skin Barriers/Peri-Wound Care: Wound #8 Right,Proximal,Medial Lower Leg: Moisturizing lotion - not on wound area Primary Wound Dressing: Drawtex Dressing Change Frequency: Wound #8 Right,Proximal,Medial Lower Leg: Change dressing every week Follow-up Appointments: Wound #8 Right,Proximal,Medial  Lower Leg: Return Appointment in 1 week. - Nurse Visit Nurse Visit as needed Edema Control: Wound #8 Right,Proximal,Medial Lower Leg: 3 Layer Compression System - Right Lower Extremity - unna to anchor Additional Orders / Instructions: Wound #8 Right,Proximal,Medial Lower Leg: Increase protein intake. Advanced Therapies: Wound #8 Right,Proximal,Medial Lower Leg: Apligraf application in clinic; including contact layer, fixation with steri strips, dry gauze and cover dressing. I am going to recommend at this point that we continue with the above wound care measures for the next week. The patient is in agreement with the plan. We will subsequently see were things stand at follow-up in two weeks time. We will see him for a nurse visit in one week. Hopefully things will continue to show signs of good improvement as far as new skin growth is concerned. Electronic Signature(s) Signed: 03/28/2018 9:12:01 AM By: Worthy Keeler PA-C Entered By: Worthy Keeler on 03/28/2018 09:04:59 [name], [name] ([id number]) -------------------------------------------------------------------------------- ROS/PFSH Details Patient Name: [name] Date of Service: 03/28/2018 8:00 AM Medical Record Number: [id number] Patient Account Number: [id number] Date of Birth/Sex: 30-Jan-1951 (67 y.o. M) Treating RN: Montey Hora Primary Care Provider: Royetta Crochet Other Clinician: Referring Provider: Royetta Crochet Treating Provider/Extender: Melburn Hake, Jorie Zee Weeks in Treatment: 22 Information Obtained From Patient Wound History Do you currently have one or more open woundso Yes How many open wounds do you currently haveo 1 Approximately how long have you had your woundso 1 week How have you been treating your wound(s) until nowo lotion Has your wound(s) ever healed and then re-openedo Yes Have you had any lab work done in the past montho No Have you tested positive for an antibiotic resistant  organism (MRSA, VRE)o No Have you tested positive for osteomyelitis (bone infection)o No Have you had any tests for circulation on your legso Yes Who ordered the testo [facility] Where was the test doneo [facility] Constitutional Symptoms (General Health) Complaints and Symptoms: Negative for: Fever; Chills Cardiovascular Complaints and Symptoms: Positive for: LE edema Medical History: Positive for: Arrhythmia - a fib; Deep Vein Thrombosis; Hypertension Negative for: Angina; Congestive Heart Failure; Coronary Artery Disease; Hypotension; Myocardial Infarction; Peripheral Arterial Disease; Peripheral Venous Disease; Phlebitis; Vasculitis Eyes Medical History: Negative for: Cataracts; Glaucoma; Optic Neuritis Ear/Nose/Mouth/Throat Medical History: Negative for:  Chronic sinus problems/congestion; Middle ear problems Past Medical History Notes: difficulty speaking Hematologic/Lymphatic Medical History: Negative for: Anemia; Hemophilia; Human Immunodeficiency Virus; Lymphedema; Sickle Cell Disease Respiratory Mckibben, Tatsuo ([id number]) Complaints and Symptoms: No Complaints or Symptoms Medical History: Positive for: Chronic Obstructive Pulmonary Disease (COPD) Negative for: Aspiration; Asthma; Pneumothorax; Sleep Apnea; Tuberculosis Past Medical History Notes: Hospitalized for Pneumonia 02/2017 Gastrointestinal Medical History: Negative for: Cirrhosis ; Colitis; Crohnos; Hepatitis A; Hepatitis B; Hepatitis C Endocrine Medical History: Positive for: Type II Diabetes Negative for: Type I Diabetes Time with diabetes: since 2017 Treated with: Diet Blood sugar tested every day: No Genitourinary Medical History: Negative for: End Stage Renal Disease Immunological Medical History: Negative for: Lupus Erythematosus; Raynaudos; Scleroderma Integumentary (Skin) Medical History: Negative for: History of Burn; History of pressure wounds Musculoskeletal Medical History: Positive  for: Gout Negative for: Rheumatoid Arthritis; Osteoarthritis; Osteomyelitis Neurologic Medical History: Positive for: Neuropathy Negative for: Dementia; Quadriplegia; Paraplegia; Seizure Disorder Past Medical History Notes: CVA in 2014 Oncologic Medical History: Negative for: Received Chemotherapy; Received Radiation Psychiatric AADIL, SUR ([id number]) Complaints and Symptoms: No Complaints or Symptoms Medical History: Negative for: Anorexia/bulimia; Confinement Anxiety Immunizations Pneumococcal Vaccine: Received Pneumococcal Vaccination: Yes Immunization Notes: up to date Implantable Devices Hospitalization / Surgery History Name of Hospital Purpose of Hospitalization/Surgery Date [facility]A 05/17/2012 [facility]neumonia 03/17/2017 Family and Social History Cancer: No; Diabetes: No; Heart Disease: Yes - Mother,Father; Hereditary Spherocytosis: No; Hypertension: Yes - Mother,Father; Stroke: Yes - Father; Thyroid Problems: No; Tuberculosis: No; Current every day smoker; Marital Status - Single; Alcohol Use: Never; Drug Use: No History; Caffeine Use: Daily; Financial Concerns: No; Food, Clothing or Shelter Needs: No; Support System Lacking: No; Transportation Concerns: No; Advanced Directives: No; Patient does not want information on Advanced Directives Physician Affirmation I have reviewed and agree with the above information. Electronic Signature(s) Signed: 03/28/2018 9:12:01 AM By: Worthy Keeler PA-C Signed: 03/28/2018 5:03:03 PM By: Montey Hora Entered By: Worthy Keeler on 03/28/2018 09:04:02 Trautman, [name] ([id number]) -------------------------------------------------------------------------------- SuperBill Details Patient Name: [name] Date of Service: 03/28/2018 Medical Record Number: [id number] Patient Account Number: [id number] Date of Birth/Sex: 1951/01/27 (67 y.o. M) Treating RN: Montey Hora Primary Care Provider: Royetta Crochet Other  Clinician: Referring Provider: Royetta Crochet Treating Provider/Extender: Melburn Hake, Itzelle Gains Weeks in Treatment: 22 Diagnosis Coding ICD-10 Codes Code Description I87.2 Venous insufficiency (chronic) (peripheral) I89.0 Lymphedema, not elsewhere classified E11.622 Type 2 diabetes mellitus with other skin ulcer L97.812 Non-pressure chronic ulcer of other part of right lower leg with fat layer exposed Facility Procedures CPT4 Code Description: 97989211 Q4101 (Facility Use Only) Apligraf 44 SQ CM Modifier: Quantity: 34 CPT4 Code Description: 94174081 15271 - SKIN SUB GRAFT TRNK/ARM/LEG ICD-10 Diagnosis Description K48.185 Non-pressure chronic ulcer of other part of right lower leg wit Modifier: h fat layer expo Quantity: 1 sed Physician Procedures CPT4 Code Description: 6314970 15271 - WC PHYS SKIN SUB GRAFT TRNK/ARM/LEG ICD-10 Diagnosis Description Y63.785 Non-pressure chronic ulcer of other part of right lower leg with Modifier: fat layer expo Quantity: 1 sed Electronic Signature(s) Signed: 03/28/2018 9:12:01 AM By: Worthy Keeler PA-C Entered By: Worthy Keeler on 03/28/2018 09:06:06

## 2018-03-30 NOTE — Progress Notes (Signed)
TANNER, [name] ([id number]) Visit Report for 03/28/2018 Arrival Information Details Patient Name: [name], [name] Date of Service: 03/28/2018 8:00 AM Medical Record Number: [id number] Patient Account Number: [id number] Date of Birth/Sex: 08-Jul-1950 (67 y.o. M) Treating RN: Montey Hora Primary Care Abrar Koone: Royetta Crochet Other Clinician: Referring Muhammadali Ries: Royetta Crochet Treating Kylen Schliep/Extender: Melburn Hake, HOYT Weeks in Treatment: 48 Visit Information History Since Last Visit Added or deleted any medications: No Patient Arrived: Ambulatory Any new allergies or adverse reactions: No Arrival Time: 08:03 Had a fall or experienced change in No Accompanied By: self activities of daily living that may affect Transfer Assistance: None risk of falls: Patient Identification Verified: Yes Signs or symptoms of abuse/neglect since last visito No Secondary Verification Process Yes Hospitalized since last visit: No Completed: Implantable device outside of the clinic excluding No Patient Requires Transmission-Based No cellular tissue based products placed in the center Precautions: since last visit: Patient Has Alerts: Yes Has Dressing in Place as Prescribed: Yes Patient Alerts: Patient on Blood Pain Present Now: Yes Thinner DMII warfarin Electronic Signature(s) Signed: 03/28/2018 2:49:09 PM By: Lorine Bears RCP, RRT, CHT Entered By: Lorine Bears on 03/28/2018 08:04:30 Visscher, [name] ([id number]) -------------------------------------------------------------------------------- Encounter Discharge Information Details Patient Name: [name] Date of Service: 03/28/2018 8:00 AM Medical Record Number: [id number] Patient Account Number: [id number] Date of Birth/Sex: [birth year] (67 y.o. M) Treating RN: Montey Hora Primary Care Azalie Harbeck: Royetta Crochet Other Clinician: Referring Cyani Kallstrom: Royetta Crochet Treating  Dyamond Tolosa/Extender: Melburn Hake, HOYT Weeks in Treatment: 22 Encounter Discharge Information Items Post Procedure Vitals Discharge Condition: Stable Temperature (F): 97.7 Ambulatory Status: Ambulatory Pulse (bpm): 51 Discharge Destination: Home Respiratory Rate (breaths/min): 16 Transportation: Private Auto Blood Pressure (mmHg): 157/80 Accompanied By: self Schedule Follow-up Appointment: Yes Clinical Summary of Care: Electronic Signature(s) Signed: 03/28/2018 5:03:03 PM By: Montey Hora Entered By: Montey Hora on 03/28/2018 08:35:27 [name], [name] ([id number]) -------------------------------------------------------------------------------- Lower Extremity Assessment Details Patient Name: [name] Date of Service: 03/28/2018 8:00 AM Medical Record Number: [id number] Patient Account Number: [id number] Date of Birth/Sex: 03-03-1951 (67 y.o. M) Treating RN: Montey Hora Primary Care Catie Chiao: Royetta Crochet Other Clinician: Referring Finlay Godbee: Royetta Crochet Treating Giamarie Bueche/Extender: Melburn Hake, HOYT Weeks in Treatment: 22 Edema Assessment Assessed: [Left: No] [Right: No] E[Left: dema] [Right: :] Calf Left: Right: Point of Measurement: 38 cm From Medial Instep cm 41.3 cm Ankle Left: Right: Point of Measurement: 14 cm From Medial Instep cm 27.2 cm Vascular Assessment Pulses: Dorsalis Pedis Palpable: [Right:Yes] Posterior Tibial Extremity colors, hair growth, and conditions: Extremity Color: [Right:Hyperpigmented] Hair Growth on Extremity: [Right:No] Temperature of Extremity: [Right:Warm] Capillary Refill: [Right:< 3 seconds] Toe Nail Assessment Left: Right: Thick: Yes Discolored: Yes Deformed: Yes Improper Length and Hygiene: No Electronic Signature(s) Signed: 03/28/2018 5:03:03 PM By: Montey Hora Entered By: Montey Hora on 03/28/2018 08:10:55 [name], [name]  ([id number]) -------------------------------------------------------------------------------- Multi Wound Chart Details Patient Name: [name] Date of Service: 03/28/2018 8:00 AM Medical Record Number: [id number] Patient Account Number: [id number] Date of Birth/Sex: [birth year] (67 y.o. 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] [N/A:N/A] Secondary Etiology: [8:Lymphedema] [N/A:N/A] Comorbid History: [8:Chronic Obstructive Pulmonary Disease (COPD), Arrhythmia, Deep Vein Thrombosis, Hypertension, Type  II Diabetes, Gout, Neuropathy] [N/A:N/A] Date Acquired: [8:10/17/2017] [N/A:N/A] Weeks of Treatment: [8:22] [N/A:N/A] Wound Status: [8:Open] [N/A:N/A] Measurements L x W x D [8:4x2x0.2] [N/A:N/A] (cm) Area (cm) : [8:6.283] [N/A:N/A] Volume (cm) : [8:1.257] [N/A:N/A] % Reduction in Area: [8:-3707.90%] [N/A:N/A] % Reduction in Volume: [8:-7756.20%] [N/A:N/A] Classification: [8:Grade 2] [N/A:N/A] Exudate Amount: [8:Large] [N/A:N/A] Exudate Type: [8:Serous] [N/A:N/A] Exudate Color: [8:amber] [N/A:N/A] Wound Margin: [8:Flat and Intact] [N/A:N/A] Granulation Amount: [8:Medium (34-66%)] [N/A:N/A] Granulation Quality: [8:Red, Pink] [N/A:N/A] Necrotic Amount: [8:Medium (34-66%)] [N/A:N/A] Exposed Structures: [8:Fat Layer (Subcutaneous Tissue) Exposed: Yes Fascia: No Tendon: No Muscle: No Joint: No Bone: No] [N/A:N/A] Epithelialization: Small (1-33%) N/A  N/A Periwound Skin Texture: Scarring: Yes N/A N/A Excoriation: No Induration: No Callus: No Crepitus: No Rash: No Periwound Skin Moisture: Maceration: No N/A N/A Dry/Scaly: No Periwound Skin Color: Hemosiderin Staining: Yes N/A N/A Atrophie Blanche: No Cyanosis: No Ecchymosis: No Erythema: No Mottled: No Pallor: No Rubor: No Temperature: No Abnormality N/A N/A Tenderness on Palpation: Yes N/A N/A Wound Preparation: Ulcer Cleansing: N/A N/A Rinsed/Irrigated with Saline, Other: soap and water Topical Anesthetic Applied: Other: lidocaine 4% Treatment Notes Electronic Signature(s) Signed: 03/28/2018 5:03:03 PM By: Montey Hora Entered By: Montey Hora on 03/28/2018 08:14:57 [name], [name] ([id number]) -------------------------------------------------------------------------------- Multi-Disciplinary Care Plan Details Patient Name: [name] Date of Service: 03/28/2018 8:00 AM Medical Record Number: [id number] Patient Account Number: [id number] Date of Birth/Sex: 1951-01-02 (67 y.o. M) Treating RN: Montey Hora Primary Care Judge Duque: Royetta Crochet Other Clinician: Referring Yitzchok Carriger: Royetta Crochet Treating Laticia Vannostrand/Extender: Melburn Hake, HOYT Weeks in Treatment: 22 Active Inactive ` Abuse / Safety / Falls / Self Care Management Nursing Diagnoses: Potential for falls Goals: Patient will not experience any injury related to falls Date Initiated: 10/25/2017 Target Resolution Date: 02/18/2018 Goal Status: Active Interventions: Assess Activities of Daily Living upon admission and as needed Assess fall risk on admission and as needed Assess: immobility, friction, shearing, incontinence upon admission and as needed Assess impairment of mobility on admission and as needed per policy Assess personal safety and home safety (as indicated) on admission and as needed Assess self care needs on admission and as needed Notes: ` Nutrition Nursing  Diagnoses: Imbalanced nutrition Impaired glucose control: actual or potential Potential for alteratiion in Nutrition/Potential for imbalanced nutrition Goals: Patient/caregiver agrees to and verbalizes understanding of need to use nutritional supplements and/or vitamins as prescribed Date Initiated: 10/25/2017 Target Resolution Date: 02/18/2018 Goal Status: Active Patient/caregiver will maintain therapeutic glucose control Date Initiated: 10/25/2017 Target Resolution Date: 02/18/2018 Goal Status: Active Interventions: Assess patient nutrition upon admission and as needed per policy Provide education on elevated blood sugars and impact on wound healing Provide education on nutrition JARIOUS, [name] ([id number]) Notes: ` Orientation to the Wound Care Program Nursing Diagnoses: Knowledge deficit related to the wound healing center program Goals: Patient/caregiver will verbalize understanding of the [place] Program Date Initiated: 10/25/2017 Target Resolution Date: 11/19/2017 Goal Status: Active Interventions: Provide education on orientation to the wound center Notes: ` Wound/Skin Impairment Nursing Diagnoses: Impaired tissue integrity Knowledge deficit related to ulceration/compromised skin integrity Goals: Ulcer/skin breakdown will have a volume reduction of 80% by week 12 Date Initiated: 10/25/2017 Target Resolution Date: 02/11/2018 Goal Status: Active Interventions: Assess patient/caregiver ability to perform ulcer/skin care regimen upon admission and as needed Assess ulceration(s) every visit Notes: Electronic Signature(s) Signed: 03/28/2018 5:03:03 PM By: Montey Hora Entered By: Montey Hora on 03/28/2018 08:14:51 [name], [name] ([id number]) -------------------------------------------------------------------------------- Pain Assessment Details Patient Name: [name] Date of Service: 03/28/2018 8:00 AM Medical Record  Number:  [id number] Patient Account Number: [id number] Date of Birth/Sex: 07-29-1950 (67 y.o. M) Treating RN: Montey Hora Primary Care Adriona Kaney: Royetta Crochet Other Clinician: Referring Haile Bosler: Royetta Crochet Treating Elowyn Raupp/Extender: [name], HOYT Weeks in Treatment: 22 Active Problems Location of Pain Severity and Description of Pain Patient Has Paino Yes Site Locations Rate the pain. Current Pain Level: 8 Pain Management and Medication Current Pain Management: Electronic Signature(s) Signed: 03/28/2018 2:49:09 PM By: Lorine Bears RCP, RRT, CHT Signed: 03/28/2018 5:03:03 PM By: Montey Hora Entered By: Lorine Bears on 03/28/2018 08:04:44 [name], [name] ([id number]) -------------------------------------------------------------------------------- Patient/Caregiver Education Details Patient Name: [name] Date of Service: 03/28/2018 8:00 AM Medical Record Number: [id number] Patient Account Number: [id number] Date of Birth/Gender: [birth year] (67 y.o. M) Treating RN: Montey Hora Primary Care Physician: Royetta Crochet Other Clinician: Referring Physician: Royetta Crochet Treating Physician/Extender: Sharalyn Ink in Treatment: 22 Education Assessment Education Provided To: Patient Education Topics Provided Venous: Handouts: Other: wrap precautions and leg elevation Methods: Explain/Verbal Responses: State content correctly Electronic Signature(s) Signed: 03/28/2018 5:03:03 PM By: Montey Hora Entered By: Montey Hora on 03/28/2018 08:34:30 [name], [name] ([id number]) -------------------------------------------------------------------------------- Wound Assessment Details Patient Name: [name] Date of Service: 03/28/2018 8:00 AM Medical Record Number: [id number] Patient Account Number: [id number] Date of Birth/Sex: Nov 20, 1950 (67 y.o. M) Treating RN: Montey Hora Primary Care Castulo Scarpelli: Royetta Crochet Other Clinician: Referring Selah Klang: Royetta Crochet Treating Darrill Vreeland/Extender: [name], HOYT Weeks in Treatment: 22 Wound Status Wound Number: 8 Primary Diabetic Wound/Ulcer of the Lower Extremity Etiology: Wound Location: Right Lower Leg - Medial, Proximal Secondary Lymphedema Wounding Event: Gradually Appeared Etiology: Date Acquired: 10/17/2017 Wound Open Weeks Of Treatment: 22 Status: Clustered Wound: No Comorbid Chronic Obstructive Pulmonary Disease History: (COPD), Arrhythmia, Deep Vein Thrombosis, Hypertension, Type II Diabetes, Gout, Neuropathy Photos Photo Uploaded By: Montey Hora on 03/28/2018 16:59:01 Wound Measurements Length: (cm) 4 Width: (cm) 2 Depth: (cm) 0.2 Area: (cm) 6.283 Volume: (cm) 1.257 % Reduction in Area: -3707.9% % Reduction in Volume: -7756.2% Epithelialization: Small (1-33%) Tunneling: No Undermining: No Wound Description Classification: Grade 2 Wound Margin: Flat and Intact Exudate Amount: Large Exudate Type: Serous Exudate Color: amber Foul Odor After Cleansing: No Slough/Fibrino Yes Wound Bed Granulation Amount: Medium (34-66%) Exposed Structure Granulation Quality: Red, Pink Fascia Exposed: No Necrotic Amount: Medium (34-66%) Fat Layer (Subcutaneous Tissue) Exposed: Yes Necrotic Quality: Adherent Slough Tendon Exposed: No Muscle Exposed: No Joint Exposed: No [place], Dasani ([id number]) Bone Exposed: No Periwound Skin Texture Texture Color No Abnormalities Noted: No No Abnormalities Noted: No Callus: No Atrophie Blanche: No Crepitus: No Cyanosis: No Excoriation: No Ecchymosis: No Induration: No Erythema: No Rash: No Hemosiderin Staining: Yes Scarring: Yes Mottled: No Pallor: No Moisture Rubor: No No Abnormalities Noted: No Dry / Scaly: No Temperature / Pain Maceration: No Temperature: No Abnormality Tenderness on Palpation: Yes Wound Preparation Ulcer Cleansing: Rinsed/Irrigated with  Saline, Other: soap and water, Topical Anesthetic Applied: Other: lidocaine 4%, Treatment Notes Wound #8 (Right, Proximal, Medial Lower Leg) Notes Apligraf applied in clinic last week, drawtex, unna to anchor and 3 layer wrap Electronic Signature(s) Signed: 03/28/2018 5:03:03 PM By: Montey Hora Entered By: Montey Hora on 03/28/2018 08:14:41 Panos, [name] ([id number]) -------------------------------------------------------------------------------- Vitals Details Patient Name: [name] Date of Service: 03/28/2018 8:00 AM Medical Record Number: [id number] Patient Account Number: [id number] Date of Birth/Sex: 12/30/1950 (67 y.o. M) Treating RN: Montey Hora Primary Care Micael Barb: Royetta Crochet Other Clinician: Referring Alnita Aybar: Royetta Crochet Treating Yulian Gosney/Extender: [name], HOYT Weeks in Treatment: 22 Vital Signs Time Taken:  08:00 Temperature (F): 97.7 Height (in): 74 Pulse (bpm): 51 Weight (lbs): 233.6 Respiratory Rate (breaths/min): 18 Body Mass Index (BMI): 30 Blood Pressure (mmHg): 157/80 Reference Range: 80 - 120 mg / dl Electronic Signature(s) Signed: 03/28/2018 2:49:09 PM By: Lorine Bears RCP, RRT, CHT Entered By: Becky Sax, Amado Nash on 03/28/2018 08:07:25

## 2018-04-04 DIAGNOSIS — E11621 Type 2 diabetes mellitus with foot ulcer: Secondary | ICD-10-CM | POA: Diagnosis not present

## 2018-04-06 NOTE — Progress Notes (Signed)
FREDDERICK, SWANGER ([id number]) Visit Report for 04/04/2018 Arrival Information Details Patient Name: [name], [name] Date of Service: 04/04/2018 8:00 AM Medical Record Number: [id number] Patient Account Number: [id number] Date of Birth/Sex: 1951/03/16 (67 y.o. M) Treating RN: Montey Hora Primary Care Emberlin Verner: Royetta Crochet Other Clinician: Referring Cherilyn Sautter: Royetta Crochet Treating Amiliah Campisi/Extender: Melburn Hake, HOYT Weeks in Treatment: 23 Visit Information History Since Last Visit Added or deleted any medications: No Patient Arrived: Ambulatory Any new allergies or adverse reactions: No Arrival Time: 08:05 Had a fall or experienced change in No Accompanied By: self activities of daily living that may affect Transfer Assistance: None risk of falls: Patient Identification Verified: Yes Signs or symptoms of abuse/neglect since last visito No Secondary Verification Process Yes Hospitalized since last visit: No Completed: Implantable device outside of the clinic excluding No Patient Requires Transmission-Based No cellular tissue based products placed in the center Precautions: since last visit: Patient Has Alerts: Yes Has Dressing in Place as Prescribed: Yes Patient Alerts: Patient on Blood Has Compression in Place as Prescribed: Yes Thinner Pain Present Now: Yes DMII warfarin Electronic Signature(s) Signed: 04/04/2018 4:52:40 PM By: Montey Hora Entered By: Montey Hora on 04/04/2018 08:23:25 [name], [name] ([id number]) -------------------------------------------------------------------------------- Compression Therapy Details Patient Name: [name] Date of Service: 04/04/2018 8:00 AM Medical Record Number: [id number] Patient Account Number: [id number] Date of Birth/Sex: Oct 07, 1950 (67 y.o. M) Treating RN: Montey Hora Primary Care Jarrell Armond: Royetta Crochet Other Clinician: Referring Averi Kilty: Royetta Crochet Treating Aldrick Derrig/Extender: STONE  III, HOYT Weeks in Treatment: 23 Compression Therapy Performed for Wound Assessment: Wound #8 Right,Proximal,Medial Lower Leg Performed By: Clinician Montey Hora, RN Compression Type: Three Layer Pre Treatment ABI: 1.2 Electronic Signature(s) Signed: 04/04/2018 4:52:40 PM By: Montey Hora Entered By: Montey Hora on 04/04/2018 08:24:25 Engelmann, [name] ([id number]) -------------------------------------------------------------------------------- Encounter Discharge Information Details Patient Name: [name] Date of Service: 04/04/2018 8:00 AM Medical Record Number: [id number] Patient Account Number: [id number] Date of Birth/Sex: [birth year] (67 y.o. M) Treating RN: Montey Hora Primary Care Jahsiah Carpenter: Royetta Crochet Other Clinician: Referring Jordynn Perrier: Royetta Crochet Treating Tanith Dagostino/Extender: Melburn Hake, HOYT Weeks in Treatment: 23 Encounter Discharge Information Items Discharge Condition: Stable Ambulatory Status: Ambulatory Discharge Destination: Home Transportation: Private Auto Accompanied By: self Schedule Follow-up Appointment: Yes Clinical Summary of Care: Electronic Signature(s) Signed: 04/04/2018 4:52:40 PM By: Montey Hora Entered By: Montey Hora on 04/04/2018 08:25:13 [name], [name] ([id number]) -------------------------------------------------------------------------------- Patient/Caregiver Education Details Patient Name: [name] Date of Service: 04/04/2018 8:00 AM Medical Record Number: [id number] Patient Account Number: [id number] Date of Birth/Gender: 1950-12-04 (67 y.o. M) Treating RN: Montey Hora Primary Care Physician: Royetta Crochet Other Clinician: Referring Physician: Royetta Crochet Treating Physician/Extender: Sharalyn Ink in Treatment: 43 Education Assessment Education Provided To: Patient Education Topics Provided Venous: Handouts: Other: lerg elevation Methods: Explain/Verbal Responses:  State content correctly Electronic Signature(s) Signed: 04/04/2018 4:52:40 PM By: Montey Hora Entered By: Montey Hora on 04/04/2018 08:25:01 [name], [name] ([id number]) -------------------------------------------------------------------------------- Wound Assessment Details Patient Name: [name] Date of Service: 04/04/2018 8:00 AM Medical Record Number: [id number] Patient Account Number: [id number] Date of Birth/Sex: 1951/02/14 (67 y.o. M) Treating RN: Montey Hora Primary Care Jacque Byron: Royetta Crochet Other Clinician: Referring Arleigh Dicola: Royetta Crochet Treating [name] Gillyard/Extender: STONE III, HOYT Weeks in Treatment: 23 Wound Status Wound Number: 8 Primary Diabetic Wound/Ulcer of the Lower Extremity Etiology: Wound Location: Right Lower Leg - Medial, Proximal Secondary Lymphedema Wounding Event: Gradually Appeared Etiology: Date Acquired: 10/17/2017 Wound Open Weeks Of Treatment: 23 Status: Clustered Wound: No Comorbid Chronic Obstructive Pulmonary Disease History: (COPD), Arrhythmia, Deep Vein Thrombosis,  Hypertension, Type II Diabetes, Gout, Neuropathy Photos Photo Uploaded By: Montey Hora on 04/04/2018 10:59:52 Wound Measurements Length: (cm) 4 Width: (cm) 2 Depth: (cm) 0.2 Area: (cm) 6.283 Volume: (cm) 1.257 % Reduction in Area: -3707.9% % Reduction in Volume: -7756.2% Epithelialization: Small (1-33%) Tunneling: No Undermining: No Wound Description Classification: Grade 2 Wound Margin: Flat and Intact Exudate Amount: Large Exudate Type: Serous Exudate Color: amber Foul Odor After Cleansing: No Slough/Fibrino Yes Wound Bed Granulation Amount: Medium (34-66%) Exposed Structure Granulation Quality: Red, Pink Fascia Exposed: No Necrotic Amount: Medium (34-66%) Fat Layer (Subcutaneous Tissue) Exposed: Yes Necrotic Quality: Adherent Slough Tendon Exposed: No Muscle Exposed: No Joint Exposed: No [name], [name]  ([id number]) Bone Exposed: No Periwound Skin Texture Texture Color No Abnormalities Noted: No No Abnormalities Noted: No Callus: No Atrophie Blanche: No Crepitus: No Cyanosis: No Excoriation: No Ecchymosis: No Induration: No Erythema: No Rash: No Hemosiderin Staining: Yes Scarring: Yes Mottled: No Pallor: No Moisture Rubor: No No Abnormalities Noted: No Dry / Scaly: No Temperature / Pain Maceration: No Temperature: No Abnormality Tenderness on Palpation: Yes Wound Preparation Ulcer Cleansing: Rinsed/Irrigated with Saline, Other: soap and water, Topical Anesthetic Applied: None Treatment Notes Wound #8 (Right, Proximal, Medial Lower Leg) Notes Apligraf applied in clinic last week, drawtex, unna to anchor and 3 layer wrap Electronic Signature(s) Signed: 04/04/2018 4:52:40 PM By: Montey Hora Entered By: Montey Hora on 04/04/2018 08:24:03

## 2018-04-11 ENCOUNTER — Encounter: Payer: Medicare Other | Admitting: Physician Assistant

## 2018-04-11 DIAGNOSIS — E11621 Type 2 diabetes mellitus with foot ulcer: Secondary | ICD-10-CM | POA: Diagnosis not present

## 2018-04-13 NOTE — Progress Notes (Signed)
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  anterior tibial arteries not visualized due to wound bandages which they unfortunately did not take off. Right lower extremity small vessel disease cannot be excluded due to limited visualization. There is of note that they want to follow-up with vascular lab study on 08/23/16. 3/7/ 18; patient comes in today with the wound bed in fairly good condition. No debridement. TheraSkin #1 08/04/16 no major change in wound dimensions although the base of this looks fairly healthy. No debridement TheraSkin #2 08/17/16- the patient is here for follow-up of a attenuation of his right lower Schmeltzer. He is status post 2 TheraSkin applications and he states he has an appointment for venous ablation with Dr.Dew on 4/13. 08/31/16; the patient had laser ablation by Dr. dew on 4/13. I think this involved both the greater and lesser saphenous veins. He tolerated this well. We have been putting TheraSkin on the wound every 2 weeks and he arrives with better-looking epithelialization today 09/14/16; the patient arrives today with an odor to his wound and some greenish necrotic surface over the wound approximately 70%. He had a small satellite lesion noted last week when we changed his dressing in between application of TheraSkin. I elected not to put that TheraSkin on today. 09/21/16; deterioration in the wound last week. I gave him empiric Cefdinir out of fear for a gram-negative infection although the CULTURE turned out to be negative. He completed his antibiotics this morning. Wound looks somewhat better, I put silver alginate on it last week again out of concern for infection. We do not have a TheraSkin this week not ordered last week 09/28/16; no major change from last week. We've looked over the volume of this wound and of not had major changes in spite of TheraSkin although the last TheraSkin was almost a month ago. We put silver alginate on last week out of fear of infection. I will switch to [facility] as looking  over the records didn't really suggest that theraskin had helped 10/12/16; we'll use Hydrofera Blue starting last week. No major change in the wound dimensions. 10/19/16; continue Hydrofera Blue. 1.8 x 2.5 x 0.2. Wound base looks healthy 10/26/16; continue with Hydrofera Blue. 1.5 x 2.4 x 0.2 11/02/16 no change in dimensions. Change from [facility] to Iodoflex 11/09/16; patient complains of increasing pain. Dimensions slightly larger. Last week I put Iodoflex on this wound to see if we can get a better surface I also increased him to 4 layer compression. He has not been systemically unwell. 11/16/16; patient states his leg feels better. He has completed his antibiotics. Dimensions are better. I change back to [facility] last week. We also change the dressing once on Friday which may have helped. Wound looks a lot better today than last week.. 2.2 x 2.4 x 0.3 11/23/16 on evaluation today patient's right lower extremity ulcer appears to be doing very well. He has been tolerating the [facility] Dressing and tells me that fortunately he is finally improvement. He is pleased with how this is progressing. [name], [name] ([id number]) 11/30/16; improved using Hydrofera Blue 12/07/16; continue dramatic progress. Wound is now small and healthy looking. Continue use of Hydrofera Blue 12/14/16; very small wound albeit with some depth. Continued use of Hydrofera blue. 12/21/16 on evaluation today patient's right lower extremity  ulcer appears healed at this point. He is having no discomfort and overall this is doing very well. And there's no sign of infection. 04/26/17; READMISSION Patient we know from previous stays in this clinic. The patient has known diabetic PAD and has a stent in the right leg. 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 When he was last in the clinic in August we had healed him out for what was felt to be a mostly venous insufficiency wound on  the medial right leg. He follows with vascular surgery Dr. dew is at [place] and vascular. He has previously undergone successful laser ablation. Reflux studies on 4/24 showed reflux in the common femoral, femoral and popliteal. He underwent successful ablation of the right greater saphenous vein from the distal thigh to the mid calf level. Successful ablation of the right small saphenous vein. He had unsuccessful ablation of the right greater saphenous vein from the saphenofemoral junction to the distal 5 level. The patient wears to let her compression stockings and he has been compliant with this With regards to his diabetes he is not currently on any treatment. He does describe pain in his leg which forces him to stop although I couldn't really quantify this. His last arterial studies were on 07/05/16 which showed triphasic waveforms on the right to the level of the popliteal artery. The right posterior tibial and anterior tibial artery were not visualized on this study. I don't actually see ABIs or TBIs. 05/04/17; the patient has small wounds on the right medial heel and the right medial leg. The area on the foot is just about closed. The area on the right medial leg is improved. He is tolerating the 3 layer compression without any complaints. The patient has both known PAD and chronic venous reflux [see discussion above]. 05/11/17; the areas on the patient's medial right heel with healed he still has an open area on the right medial leg. This does not require debridement today I think I did read it last week. We are using silver collagen under 3 layer compression. 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 He has been tolerating the dressing changes without complication. With that being said he does have some  epithelialization growing into the wound bed and it appears this wound may be trying to healed in a depressed fashion. To be honest that is not the worst case scenario and I think this may definitely do fine even if it heals in that way. There was no requirement for debridement today as patient appears to be doing very well in regard to his ulcer. His swelling was a little bit more severe than last week but nothing too significant. 07/05/17 on evaluation today patient appears to be doing rather well in regard to his right medial lower extremity ulcer. The wound does appear to be healing although it is healing and somewhat of a cratered fashion the good news is he has excellent epithelium noted. Fortunately there does not appear to be any evidence of infection at this point he is tolerating the dressings as well is the wrap well. 07/12/17 on evaluation today patient appears to be doing very well in regard to his left medial lower extremity ulcer. He has been tolerating the dressing changes without complication. In fact compared to last week  this wound which was somewhat concave has fielding dramatically and looks to be doing excellent. I'm extremely happy with the progress. He still has pain rated to be a 4-5/10 but the wound looks great. 07/20/17; the patient's right medial lower extremity ulcer is closed. He has good edema control. Unfortunately he does not really have adequate compression stockings. 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 He has been treated with compression stockings for some time now in fact we have notes having treated him even as far back as 2016 here in  our clinic. Fortunately he has excellent blood flow and tends to heal well. Unfortunately despite wearing his compression stockings, keeping his legs elevated, being on fluid pills (Lasix) and doing everything he can to try to help with fluid he still continues to have issues with this reopening. I think that he may be a good candidate for compression/lymphedema pumps. We have never attempted to get these for him previously. No fevers, chills, nausea, or vomiting noted at this time. Currently patient states his ulcer does hurt although not as bad as some in the past it's also not as large as it has been in the past when it reopened he talked is somewhat early. Obviously this is good news. [name], BARNHILL ([id number]) 12/13/17 on evaluation today patient appears to actually be doing very well in regard to his right medial ankle ulcer compared to last week's evaluation. I think having him on the correct antibiotics has made all the difference in the world. With that being said he's not having discomfort like he was previous and in general I feel like he has made excellent strides towards healing in the past week. No fevers, chills, nausea, or vomiting noted at this time. 01/10/18 on evaluation today patient appears to be doing a little worse compared to last week's evaluation. I think that though the Iodoflex has help with cleaning up the surface of the wound subsequently he has having a lot of drainage which I think is causing some issues in that regard. At this point I think we may want to switch the dressings. 01/17/18 on evaluation today patient appears to be doing rather well in regard to his right medial malleolus ulcer. The good news is I do feel like he showing some signs of improvement which is excellent. I feel like that the [facility] Dressing been better for him than the Iodoflex that we will previously utilizing. He is having much less in the way of discomfort which is good news.  Overall very pleased in that regard. Nonetheless the appearance of the wound is improved today as well and that is excellent news. He still has pain but not seemingly as much as previous and I don't feel like there's much maceration either. 01/23/18 on evaluation today patient appears in our clinic a day early for his appointment due to the fact that he was having discomfort and issues with his wrap. Upon inspection of the wound actually appears to be doing fairly well which is good news. With that being said he is having a lot of discomfort at this point unfortunately. I think that this is mainly due to the fact that he  is having such drainage as the wound bed itself seems to be doing well. I do believe the [facility] Dressing well is helpful for him which is good news. Nonetheless again I think he may need more frequent dressing changes. 01/31/18 on evaluation today patient actually appears to be doing very well in regard to his right medial ankle ulcer. This is significantly better than what was even noted last week I do think that the extra dressing change with the nurse visit toward the end of the week made a significant improvement for him. Overall I'm very pleased with how things appear today. 02/07/18 on evaluation today patient actually appears to be doing a little better in regard to his wound. The measurements are smaller today compared to previous. Fortunately there's no evidence of any infection and I do believe the moisture is much better control with more frequent wrap changes. Overall the patient seems to be making good progress. 02/14/18 on evaluation today patient actually appears to be doing very well in regard to his medial ankle ulcer of the right lower extremity. With that being said he saw slough buildup and this is still been somewhat slow to heal. For that reason my suggestion currently is gonna be that we may need to see about checking on a skin substitute possibly Apligraf to  see if this will be of benefit for him. In the past he has benefited from Apligraf. Overall there does not appear to be any evidence of infection at this time which is definitely good news. 02/28/18 on evaluation today patient's medial ankle ulcer on the right actually appears to be doing significantly better compared to previous. He has been tolerating the dressing changes without complication. Fortunately there appears to be no evidence of infection which is great news. Overall I do feel like that he has made improvement although there has been some delay in general and the wound is taken a little longer than I would've liked up to this point. Nonetheless there's no sign of infection and therefore I think that we can proceed with the Apligraf applications we actually got this approved for him. That something that I'm gonna see about ordering for next week. 03/07/18 on evaluation today patient unfortunately appears to be doing significantly worse than normal as far as his overall and general appearance is concerned. He has been tolerating the dressing changes without complication. With that being said nothing appears to be problematic in regard to his wound. The issue that I see right now involves more his general malaise and the fact that he does appear to have atrial fibrillation which is a known issue but unfortunately he is dropping low to the point of having 3-5 seconds of sustained pulses that are around 30 bpm. This was confirmed actually during evaluation today by myself. He's been having left lower quadrant pain and was actually in the emergency department on 17 October most recently. According to the notes reviewed it did not appear that anything was identified as far as a cause of the reason for this. It does not appear to the wound related which is good news. Nonetheless still I'm unsure of exactly what is going on but I am worried on top of everything else and the patient feeling  lethargic he showed up late for his appointment today which is very uncharacteristic for this gentleman. 03/15/18; the patient was sent to the ER last week during his evaluation but he was not admitted. I have not reviewed the emergency  room record. He states he feels better. He is here for application of Apligraf #1. The Apligraf we had would've expired tomorrow so he was brought in early 03/28/18 on evaluation today patient actually appears to be doing well in my pinion in regard to the overall appearance of his right lower Trinity ulcer. He has been tolerating the dressing changes without complication. Fortunately there does not appear to be any evidence of infection at this time which is also good news. He does seem to have tolerated the first Apligraf [name], [name] ([id number]) application. He is here for number two today. 04/11/18 upon evaluation today patient actually appears to be doing very well in regard to his right medial malleolus ulcer. He has been tolerating the dressing changes specifically the compression wrap without complication. The Apligraf does seem to be beneficial for him he showing signs of good improvement which is excellent news. Overall I'm very pleased even though the measurements have not dramatically improve the appearance of the wound has. Patient History Information obtained from Patient. Family History Heart Disease - Mother,Father, Hypertension - Mother,Father, Stroke - Father, No family history of Cancer, Diabetes, Hereditary Spherocytosis, Thyroid Problems, Tuberculosis. Social History Current every day smoker, Marital Status - Single, Alcohol Use - Never, Drug Use - No History, Caffeine Use - Daily. Medical History Hospitalization/Surgery History - 05/17/2012, [facility], CVA. - 03/17/2017, [facility], Pneumonia. Medical And Surgical History Notes Ear/Nose/Mouth/Throat difficulty speaking Respiratory Hospitalized for Pneumonia 02/2017 Neurologic CVA in  2014 Review of Systems (ROS) Constitutional Symptoms (General Health) Denies complaints or symptoms of Fever, Chills, Marked Weight Change. Respiratory The patient has no complaints or symptoms. Cardiovascular Complains or has symptoms of LE edema. Psychiatric The patient has no complaints or symptoms. Objective Constitutional Well-nourished and well-hydrated in no acute distress. Vitals Time Taken: 8:00 AM, Height: 74 in, Weight: 233.6 lbs, BMI: 30, Temperature: 97.5 F, Pulse: 53 bpm, Respiratory Rate: 18 breaths/min, Blood Pressure: 139/78 mmHg. Respiratory normal breathing without difficulty. Horiuchi, [name] ([id number]) Psychiatric this patient is able to make decisions and demonstrates good insight into disease process. Alert and Oriented x 3. pleasant and cooperative. General Notes: Patient has good epithelialization around several areas of the previous wound bed although there is still some Slough in necrotic tissue on the surface of the wound which is going to require sharp debridement today. The patient tolerated this he did have discomfort but fortunately we were able to get the area clean and prepared for the next application of Apligraf. There does not appear to be any signs of infection. Therefore I did go ahead and applied the third Apligraf application for the patient today. This was secured down with [employer] and then Steri-Strips. Integumentary (Hair, Skin) Wound #8 status is Open. Original cause of wound was Gradually Appeared. The wound is located on the Right,Proximal,Medial Lower Leg. The wound measures 4cm length x 1.8cm width x 0.2cm depth; 5.655cm^2 area and 1.131cm^3 volume. There is Fat Layer (Subcutaneous Tissue) Exposed exposed. There is a large amount of serosanguineous drainage noted. The wound margin is flat and intact. There is medium (34-66%) red, pink granulation within the wound bed. There is a medium (34-66%) amount of necrotic tissue within the  wound bed including Adherent Slough. The periwound skin appearance exhibited: Scarring, Hemosiderin Staining. The periwound skin appearance did not exhibit: Callus, Crepitus, Excoriation, Induration, Rash, Dry/Scaly, Maceration, Atrophie Blanche, Cyanosis, Ecchymosis, Mottled, Pallor, Rubor, Erythema. Periwound temperature was noted as No Abnormality. The periwound has tenderness on palpation. 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 There was a Excisional Skin/Subcutaneous Tissue Debridement with a total area of 5.4 sq cm performed by STONE III, HOYT E., PA-C. With the following instrument(s): Curette to remove Viable and Non-Viable tissue/material. Material removed includes Subcutaneous Tissue and Slough and after achieving pain control using Lidocaine 4% Topical Solution. No specimens were taken. A time out was conducted at 08:39, prior to the start of the procedure. A Minimum amount of bleeding was controlled with Pressure. The procedure was tolerated well with a pain level of 0 throughout and a pain level of 0 following the procedure. Post Debridement Measurements: 4cm length x 1.8cm width x 0.3cm depth; 1.696cm^3 volume. Character of Wound/Ulcer Post Debridement is improved. Severity of Tissue Post Debridement is: Fat layer exposed. Post procedure Diagnosis Wound #8: Same as Pre-Procedure Pre-procedure diagnosis of Wound #8 is a Diabetic Wound/Ulcer of the Lower Extremity located on the Right,Proximal,Medial Lower Leg. A skin graft procedure using a bioengineered skin  substitute/cellular or tissue based product was performed by STONE III, HOYT E., PA-C with the following instrument(s): Forceps and Scissors. Apligraf was applied and secured with Layne, Barron ([id number]) Steri-Strips. 22 sq cm of product was utilized and 22 sq cm was wasted due to wound size. Post Application, mepitel one was applied. A Time Out was conducted at 08:46, prior to the start of the procedure. The procedure was tolerated well with a pain level of 0 throughout and a pain level of 0 following the procedure. Post procedure Diagnosis Wound #8: Same as Pre-Procedure . Plan Wound Cleansing: Wound #8 Right,Proximal,Medial Lower Leg: Clean wound with Normal Saline. Cleanse wound with mild soap and water Anesthetic (add to Medication List): Wound #8 Right,Proximal,Medial Lower Leg: Topical Lidocaine 4% cream applied to wound bed prior to debridement (In Clinic Only). Skin Barriers/Peri-Wound Care: Wound #8 Right,Proximal,Medial Lower Leg: Moisturizing lotion - not on wound area Primary Wound Dressing: ABD Pad - may use hydrogel if needed for dryness Dressing Change Frequency: Wound #8 Right,Proximal,Medial Lower Leg: Change dressing every week Follow-up Appointments: Wound #8 Right,Proximal,Medial Lower Leg: Return Appointment in 1 week. - Nurse Visit 04/18/18 Nurse Visit as needed Edema Control: Wound #8 Right,Proximal,Medial Lower Leg: 3 Layer Compression System - Right Lower Extremity - unna to anchor Additional Orders / Instructions: Wound #8 Right,Proximal,Medial Lower Leg: Increase protein intake. Advanced Therapies: Wound #8 Right,Proximal,Medial Lower Leg: Apligraf application in clinic; including contact layer, fixation with steri strips, dry gauze and cover dressing. We will see were things stand for a nurse visit next week and then subsequently will see him back for follow-up in two weeks for the next Apligraf applications. Patient is in agreement that plan.  If anything changes or worsens meantime he will let me know. Please see above for specific wound care orders. We will see patient for re-evaluation in 1 week(s) here in the clinic. If anything worsens or changes patient will contact our office for additional recommendations. Electronic Signature(s) Signed: 04/12/2018 12:54:11 AM By: Worthy Keeler PA-C Entered By: Worthy Keeler on 04/11/2018 11:21:08 Chambers, [name] ([id number]) -------------------------------------------------------------------------------- ROS/PFSH Details Patient Name: [name] Date of Service: 04/11/2018 8:00 AM Medical Record Number: [id number] Patient Account  Number: [id number] Date of Birth/Sex: 1950-06-20 (67 y.o. M) Treating RN: Montey Hora Primary Care Provider: Royetta Crochet Other Clinician: Referring Provider: Royetta Crochet Treating Provider/Extender: Melburn Hake, HOYT Weeks in Treatment: 24 Information Obtained From Patient Wound History Do you currently have one or more open woundso Yes How many open wounds do you currently haveo 1 Approximately how long have you had your woundso 1 week How have you been treating your wound(s) until nowo lotion Has your wound(s) ever healed and then re-openedo Yes Have you had any lab work done in the past montho No Have you tested positive for an antibiotic resistant organism (MRSA, VRE)o No Have you tested positive for osteomyelitis (bone infection)o No Have you had any tests for circulation on your legso Yes Who ordered the testo [facility] Where was the test doneo [facility] Constitutional Symptoms (General Health) Complaints and Symptoms: Negative for: Fever; Chills; Marked Weight Change Cardiovascular Complaints and Symptoms: Positive for: LE edema Medical History: Positive for: Arrhythmia - a fib; Deep Vein Thrombosis; Hypertension Negative for: Angina; Congestive Heart Failure; Coronary Artery Disease; Hypotension; Myocardial Infarction;  Peripheral Arterial Disease; Peripheral Venous Disease; Phlebitis; Vasculitis Eyes Medical History: Negative for: Cataracts; Glaucoma; Optic Neuritis Ear/Nose/Mouth/Throat Medical History: Negative for: Chronic sinus problems/congestion; Middle ear problems Past Medical History Notes: difficulty speaking Hematologic/Lymphatic Medical History: Negative for: Anemia; Hemophilia; Human Immunodeficiency Virus; Lymphedema; Sickle Cell Disease Respiratory Olano, Aladdin ([id number]) Complaints and Symptoms: No Complaints or Symptoms Medical History: Positive for: Chronic Obstructive Pulmonary Disease (COPD) Negative for: Aspiration; Asthma; Pneumothorax; Sleep Apnea; Tuberculosis Past Medical History Notes: Hospitalized for Pneumonia 02/2017 Gastrointestinal Medical History: Negative for: Cirrhosis ; Colitis; Crohnos; Hepatitis A; Hepatitis B; Hepatitis C Endocrine Medical History: Positive for: Type II Diabetes Negative for: Type I Diabetes Time with diabetes: since 2017 Treated with: Diet Blood sugar tested every day: No Genitourinary Medical History: Negative for: End Stage Renal Disease Immunological Medical History: Negative for: Lupus Erythematosus; Raynaudos; Scleroderma Integumentary (Skin) Medical History: Negative for: History of Burn; History of pressure wounds Musculoskeletal Medical History: Positive for: Gout Negative for: Rheumatoid Arthritis; Osteoarthritis; Osteomyelitis Neurologic Medical History: Positive for: Neuropathy Negative for: Dementia; Quadriplegia; Paraplegia; Seizure Disorder Past Medical History Notes: CVA in 2014 Oncologic Medical History: Negative for: Received Chemotherapy; Received Radiation Psychiatric [name], [name] ([id number]) Complaints and Symptoms: No Complaints or Symptoms Medical History: Negative for: Anorexia/bulimia; Confinement Anxiety Immunizations Pneumococcal Vaccine: Received Pneumococcal Vaccination:  Yes Immunization Notes: up to date Implantable Devices Hospitalization / Surgery History Name of Hospital Purpose of Hospitalization/Surgery Date [facility] CVA 05/17/2012 [place] Pneumonia 03/17/2017 Family and Social History Cancer: No; Diabetes: No; Heart Disease: Yes - Mother,Father; Hereditary Spherocytosis: No; Hypertension: Yes - Mother,Father; Stroke: Yes - Father; Thyroid Problems: No; Tuberculosis: No; Current every day smoker; Marital Status - Single; Alcohol Use: Never; Drug Use: No History; Caffeine Use: Daily; Financial Concerns: No; Food, Clothing or Shelter Needs: No; Support System Lacking: No; Transportation Concerns: No; Advanced Directives: No; Patient does not want information on Advanced Directives Physician Affirmation I have reviewed and agree with the above information. Electronic Signature(s) Signed: 04/11/2018 5:02:25 PM By: Montey Hora Signed: 04/12/2018 12:54:11 AM By: Worthy Keeler PA-C Entered By: Worthy Keeler on 04/11/2018 11:20:10 Pistole, [name] ([id number]) -------------------------------------------------------------------------------- SuperBill Details Patient Name: [name] Date of Service: 04/11/2018 Medical Record Number: [id number] Patient Account Number: [id number] Date of Birth/Sex: 1950/07/04 (67 y.o. M) Treating RN: Montey Hora Primary Care Provider: Royetta Crochet Other Clinician: Referring Provider: Royetta Crochet Treating Provider/Extender: STONE III, HOYT Weeks in  Treatment: 24 Diagnosis Coding ICD-10 Codes Code Description I87.2 Venous insufficiency (chronic) (peripheral) I89.0 Lymphedema, not elsewhere classified E11.622 Type 2 diabetes mellitus with other skin ulcer L97.812 Non-pressure chronic ulcer of other part of right lower leg with fat layer exposed Facility Procedures CPT4 Code Description: 14604799 Q4101 (Facility Use Only) Apligraf 44 SQ CM Modifier: Quantity: 80 CPT4 Code Description: 87215872 15271 -  SKIN SUB GRAFT TRNK/ARM/LEG ICD-10 Diagnosis Description B61.848 Non-pressure chronic ulcer of other part of right lower leg wit Modifier: h fat layer expo Quantity: 1 sed Physician Procedures CPT4 Code Description: 5927639 15271 - WC PHYS SKIN SUB GRAFT TRNK/ARM/LEG ICD-10 Diagnosis Description E32.003 Non-pressure chronic ulcer of other part of right lower leg with Modifier: fat layer expo Quantity: 1 sed Electronic Signature(s) Signed: 04/12/2018 12:54:11 AM By: Worthy Keeler PA-C Entered By: Worthy Keeler on 04/11/2018 11:21:19

## 2018-04-18 ENCOUNTER — Encounter: Payer: Medicare Other | Attending: Physician Assistant

## 2018-04-18 DIAGNOSIS — E1151 Type 2 diabetes mellitus with diabetic peripheral angiopathy without gangrene: Secondary | ICD-10-CM | POA: Insufficient documentation

## 2018-04-18 DIAGNOSIS — I4891 Unspecified atrial fibrillation: Secondary | ICD-10-CM | POA: Insufficient documentation

## 2018-04-18 DIAGNOSIS — Z86718 Personal history of other venous thrombosis and embolism: Secondary | ICD-10-CM | POA: Diagnosis not present

## 2018-04-18 DIAGNOSIS — Z8249 Family history of ischemic heart disease and other diseases of the circulatory system: Secondary | ICD-10-CM | POA: Diagnosis not present

## 2018-04-18 DIAGNOSIS — E1140 Type 2 diabetes mellitus with diabetic neuropathy, unspecified: Secondary | ICD-10-CM | POA: Insufficient documentation

## 2018-04-18 DIAGNOSIS — J449 Chronic obstructive pulmonary disease, unspecified: Secondary | ICD-10-CM | POA: Insufficient documentation

## 2018-04-18 DIAGNOSIS — I872 Venous insufficiency (chronic) (peripheral): Secondary | ICD-10-CM | POA: Insufficient documentation

## 2018-04-18 DIAGNOSIS — I890 Lymphedema, not elsewhere classified: Secondary | ICD-10-CM | POA: Diagnosis not present

## 2018-04-18 DIAGNOSIS — F17210 Nicotine dependence, cigarettes, uncomplicated: Secondary | ICD-10-CM | POA: Insufficient documentation

## 2018-04-18 DIAGNOSIS — L97812 Non-pressure chronic ulcer of other part of right lower leg with fat layer exposed: Secondary | ICD-10-CM | POA: Diagnosis not present

## 2018-04-18 DIAGNOSIS — Z8673 Personal history of transient ischemic attack (TIA), and cerebral infarction without residual deficits: Secondary | ICD-10-CM | POA: Insufficient documentation

## 2018-04-18 DIAGNOSIS — Z86711 Personal history of pulmonary embolism: Secondary | ICD-10-CM | POA: Insufficient documentation

## 2018-04-18 DIAGNOSIS — Z7901 Long term (current) use of anticoagulants: Secondary | ICD-10-CM | POA: Diagnosis not present

## 2018-04-18 DIAGNOSIS — M109 Gout, unspecified: Secondary | ICD-10-CM | POA: Insufficient documentation

## 2018-04-18 DIAGNOSIS — E11621 Type 2 diabetes mellitus with foot ulcer: Secondary | ICD-10-CM | POA: Insufficient documentation

## 2018-04-18 DIAGNOSIS — Z823 Family history of stroke: Secondary | ICD-10-CM | POA: Insufficient documentation

## 2018-04-18 DIAGNOSIS — I110 Hypertensive heart disease with heart failure: Secondary | ICD-10-CM | POA: Insufficient documentation

## 2018-04-19 NOTE — Progress Notes (Signed)
RONIE, BARNHART ([id number]) Visit Report for 04/18/2018 Arrival Information Details Patient Name: [name], [name] Date of Service: 04/18/2018 8:00 AM Medical Record Number: [id number] Patient Account Number: [id number] Date of Birth/Sex: 1951-05-16 (67 y.o. M) Treating RN: Montey Hora Primary Care Malekai Markwood: Royetta Crochet Other Clinician: Referring Lorrin Bodner: Royetta Crochet Treating Keisa Blow/Extender: Melburn Hake, HOYT Weeks in Treatment: 25 Visit Information History Since Last Visit Added or deleted any medications: No Patient Arrived: Ambulatory Any new allergies or adverse reactions: No Arrival Time: 08:03 Had a fall or experienced change in No Accompanied By: self activities of daily living that may affect Transfer Assistance: None risk of falls: Patient Identification Verified: Yes Signs or symptoms of abuse/neglect since last visito No Secondary Verification Process Yes Hospitalized since last visit: No Completed: Implantable device outside of the clinic excluding No Patient Requires Transmission-Based No cellular tissue based products placed in the center Precautions: since last visit: Patient Has Alerts: Yes Has Dressing in Place as Prescribed: Yes Patient Alerts: Patient on Blood Has Compression in Place as Prescribed: Yes Thinner Pain Present Now: Yes DMII warfarin Electronic Signature(s) Signed: 04/18/2018 8:29:16 AM By: Montey Hora Entered By: Montey Hora on 04/18/2018 08:29:15 [name], [name] ([id number]) -------------------------------------------------------------------------------- Compression Therapy Details Patient Name: [name] Date of Service: 04/18/2018 8:00 AM Medical Record Number: [id number] Patient Account Number: [id number] Date of Birth/Sex: 1951/02/02 (67 y.o. M) Treating RN: Montey Hora Primary Care Harla Mensch: Royetta Crochet Other Clinician: Referring Oshea Percival: Royetta Crochet Treating Allenmichael Mcpartlin/Extender: STONE  III, HOYT Weeks in Treatment: 25 Compression Therapy Performed for Wound Assessment: Wound #8 Right,Proximal,Medial Lower Leg Performed By: Clinician Montey Hora, RN Compression Type: Three Layer Pre Treatment ABI: 1.2 Electronic Signature(s) Signed: 04/18/2018 8:30:55 AM By: Montey Hora Entered By: Montey Hora on 04/18/2018 08:30:55 [name], [name] ([id number]) -------------------------------------------------------------------------------- Encounter Discharge Information Details Patient Name: [name] Date of Service: 04/18/2018 8:00 AM Medical Record Number: [id number] Patient Account Number: [id number] Date of Birth/Sex: May 28, 1950 (67 y.o. M) Treating RN: Montey Hora Primary Care Brax Walen: Royetta Crochet Other Clinician: Referring Ellamarie Naeve: Royetta Crochet Treating Shuntia Exton/Extender: Melburn Hake, HOYT Weeks in Treatment: 25 Encounter Discharge Information Items Discharge Condition: Stable Ambulatory Status: Ambulatory Discharge Destination: Home Transportation: Private Auto Accompanied By: self Schedule Follow-up Appointment: Yes Clinical Summary of Care: Electronic Signature(s) Signed: 04/18/2018 8:32:06 AM By: Montey Hora Entered By: Montey Hora on 04/18/2018 08:32:06 [name], [name] ([id number]) -------------------------------------------------------------------------------- Pain Assessment Details Patient Name: [name] Date of Service: 04/18/2018 8:00 AM Medical Record Number: [id number] Patient Account Number: [id number] Date of Birth/Sex: [birth year] (67 y.o. M) Treating RN: Montey Hora Primary Care Lasean Gorniak: Royetta Crochet Other Clinician: Referring Haeleigh Streiff: Royetta Crochet Treating Samad Thon/Extender: STONE III, HOYT Weeks in Treatment: 25 Active Problems Location of Pain Severity and Description of Pain Patient Has Paino Yes Site Locations Pain Location: Pain in Ulcers With Dressing Change: Yes Duration of the  Pain. Constant / Intermittento Constant Rate the pain. Current Pain Level: 6 Pain Management and Medication Current Pain Management: Electronic Signature(s) Signed: 04/18/2018 8:29:44 AM By: Montey Hora Entered By: Montey Hora on 04/18/2018 08:29:44 [name], [name] ([id number]) -------------------------------------------------------------------------------- Patient/Caregiver Education Details Patient Name: [name] Date of Service: 04/18/2018 8:00 AM Medical Record Number: [id number] Patient Account Number: [id number] Date of Birth/Gender: 1951/04/05 (67 y.o. M) Treating RN: Montey Hora Primary Care Physician: Royetta Crochet Other Clinician: Referring Physician: Royetta Crochet Treating Physician/Extender: Sharalyn Ink in Treatment: 25 Education Assessment Education Provided To: Patient Education Topics Provided Venous: Handouts: Other: leg elevation Methods: Explain/Verbal Responses: State content correctly Electronic Signature(s) Signed: 04/18/2018 5:21:38  PM By: Montey Hora Entered By: Montey Hora on 04/18/2018 08:31:55 [name], [name] ([id number]) -------------------------------------------------------------------------------- Wound Assessment Details Patient Name: [name] Date of Service: 04/18/2018 8:00 AM Medical Record Number: [id number] Patient Account Number: [id number] Date of Birth/Sex: Sep 21, 1950 (67 y.o. M) Treating RN: Montey Hora Primary Care Trisha Ken: Royetta Crochet Other Clinician: Referring Kiyonna Tortorelli: Royetta Crochet Treating Carmino Ocain/Extender: STONE III, HOYT Weeks in Treatment: 25 Wound Status Wound Number: 8 Primary Diabetic Wound/Ulcer of the Lower Extremity Etiology: Wound Location: Right Lower Leg - Medial, Proximal Secondary Lymphedema Wounding Event: Gradually Appeared Etiology: Date Acquired: 10/17/2017 Wound Open Weeks Of Treatment: 25 Status: Clustered Wound: No Comorbid Chronic  Obstructive Pulmonary Disease History: (COPD), Arrhythmia, Deep Vein Thrombosis, Hypertension, Type II Diabetes, Gout, Neuropathy Photos Photo Uploaded By: Montey Hora on 04/18/2018 08:32:47 Wound Measurements Length: (cm) 4 Width: (cm) 1.8 Depth: (cm) 0.2 Area: (cm) 5.655 Volume: (cm) 1.131 % Reduction in Area: -3327.3% % Reduction in Volume: -6968.7% Epithelialization: Small (1-33%) Tunneling: No Undermining: No Wound Description Classification: Grade 2 Wound Margin: Flat and Intact Exudate Amount: Large Exudate Type: Serosanguineous Exudate Color: red, brown Foul Odor After Cleansing: No Slough/Fibrino Yes Wound Bed Granulation Amount: Medium (34-66%) Exposed Structure Granulation Quality: Red, Pink Fascia Exposed: No Necrotic Amount: Medium (34-66%) Fat Layer (Subcutaneous Tissue) Exposed: Yes Necrotic Quality: Adherent Slough Tendon Exposed: No Muscle Exposed: No Joint Exposed: No [name], [name] ([id number]) Bone Exposed: No Periwound Skin Texture Texture Color No Abnormalities Noted: No No Abnormalities Noted: No Callus: No Atrophie Blanche: No Crepitus: No Cyanosis: No Excoriation: No Ecchymosis: No Induration: No Erythema: No Rash: No Hemosiderin Staining: Yes Scarring: Yes Mottled: No Pallor: No Moisture Rubor: No No Abnormalities Noted: No Dry / Scaly: No Temperature / Pain Maceration: No Temperature: No Abnormality Tenderness on Palpation: Yes Wound Preparation Ulcer Cleansing: Rinsed/Irrigated with Saline, Other: soap and water, Topical Anesthetic Applied: None Treatment Notes Wound #8 (Right, Proximal, Medial Lower Leg) Notes Apligraf applied in clinic last week, hydrogel, drawtex, unna to anchor and 3 layer wrap Electronic Signature(s) Signed: 04/18/2018 8:30:33 AM By: Montey Hora Entered By: Montey Hora on 04/18/2018 08:30:33

## 2018-04-25 ENCOUNTER — Encounter: Payer: Medicare Other | Admitting: Physician Assistant

## 2018-04-25 DIAGNOSIS — E11621 Type 2 diabetes mellitus with foot ulcer: Secondary | ICD-10-CM | POA: Diagnosis not present

## 2018-04-30 NOTE — Progress Notes (Signed)
OUMAR, MARCOTT ([id number]) Visit Report for 04/25/2018 Arrival Information Details Patient Name: [name], [name] Date of Service: 04/25/2018 8:15 AM Medical Record Number: [id number] Patient Account Number: [id number] Date of Birth/Sex: Sep 13, 1950 (67 y.o. M) Treating RN: Cornell Barman Primary Care Amyrie Illingworth: Royetta Crochet Other Clinician: Referring Lela Gell: Royetta Crochet Treating Aubrionna Istre/Extender: Melburn Hake, HOYT Weeks in Treatment: 26 Visit Information History Since Last Visit Added or deleted any medications: No Patient Arrived: Ambulatory Any new allergies or adverse reactions: No Arrival Time: 08:16 Had a fall or experienced change in No Accompanied By: self activities of daily living that may affect Transfer Assistance: None risk of falls: Patient Identification Verified: Yes Signs or symptoms of abuse/neglect since last visito No Secondary Verification Process Yes Hospitalized since last visit: No Completed: Implantable device outside of the clinic excluding No Patient Requires Transmission-Based No cellular tissue based products placed in the center Precautions: since last visit: Patient Has Alerts: Yes Has Dressing in Place as Prescribed: Yes Patient Alerts: Patient on Blood Pain Present Now: No Thinner DMII warfarin Electronic Signature(s) Signed: 04/26/2018 5:09:03 PM By: Gretta Cool, [name], RN, CWS, [name], [name] Entered By: Gretta Cool, [name], RN, CWS, Kim on 04/25/2018 08:17:05 Yorke, [name] ([id number]) -------------------------------------------------------------------------------- Encounter Discharge Information Details Patient Name: [name] Date of Service: 04/25/2018 8:15 AM Medical Record Number: [id number] Patient Account Number: [id number] Date of Birth/Sex: Jan 09, 1951 (67 y.o. M) Treating RN: Montey Hora Primary Care Silvanna Ohmer: Royetta Crochet Other Clinician: Referring Florie Carico: Royetta Crochet Treating Della Homan/Extender: Melburn Hake,  HOYT Weeks in Treatment: 75 Encounter Discharge Information Items Post Procedure Vitals Discharge Condition: Stable Temperature (F): 98.0 Ambulatory Status: Ambulatory Pulse (bpm): 56 Discharge Destination: Home Respiratory Rate (breaths/min): 16 Transportation: Private Auto Blood Pressure (mmHg): 113/60 Accompanied By: self Schedule Follow-up Appointment: Yes Clinical Summary of Care: Electronic Signature(s) Signed: 04/25/2018 5:28:58 PM By: Montey Hora Entered By: Montey Hora on 04/25/2018 08:55:25 Lazard, [name] ([id number]) -------------------------------------------------------------------------------- Lower Extremity Assessment Details Patient Name: [name] Date of Service: 04/25/2018 8:15 AM Medical Record Number: [id number] Patient Account Number: [id number] Date of Birth/Sex: 10-Sep-1950 (67 y.o. M) Treating RN: Harold Barban Primary Care Laquisha Northcraft: Royetta Crochet Other Clinician: Referring [place]re [place]: Royetta Crochet Treating Alissia Lory/Extender: Melburn Hake, HOYT Weeks in Treatment: 26 Electronic Signature(s) Signed: 04/25/2018 5:05:35 PM By: Harold Barban Entered By: Harold Barban on 04/25/2018 08:29:40 Fish, [name] ([id number]) -------------------------------------------------------------------------------- Multi Wound Chart Details Patient Name: [name] Date of Service: 04/25/2018 8:15 AM Medical Record Number: [id number] Patient Account Number: [id number] Date of Birth/Sex: 03-03-1951 (67 y.o. 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] [N/A:N/A] Secondary Etiology: [8:Lymphedema] [N/A:N/A] Comorbid History: [8:Chronic Obstructive Pulmonary Disease (COPD), Arrhythmia, Deep Vein Thrombosis, Hypertension, Type II Diabetes, Gout, Neuropathy] [N/A:N/A] Date Acquired: [8:10/17/2017] [N/A:N/A] Weeks of Treatment: [8:26] [N/A:N/A] Wound Status: [8:Open] [N/A:N/A] Measurements L x W x D [8:1.6x1.8x0.3] [N/A:N/A] (cm) Area (cm) : [8:2.262] [N/A:N/A] Volume (cm) : [8:0.679] [N/A:N/A] % Reduction in Area: [8:-1270.90%] [N/A:N/A] % Reduction in Volume: [8:-4143.70%] [N/A:N/A] Classification: [8:Grade 2] [N/A:N/A] Exudate Amount: [8:Large] [N/A:N/A] Exudate Type: [8:Serosanguineous] [N/A:N/A] Exudate Color: [8:red, brown] [N/A:N/A] Wound Margin: [8:Flat and Intact] [N/A:N/A] Granulation Amount: [8:Medium (34-66%)] [N/A:N/A] Granulation Quality: [8:Red, Pink] [N/A:N/A] Necrotic Amount: [  8:Medium (34-66%)] [N/A:N/A] Exposed Structures: [8:Fat Layer (Subcutaneous Tissue) Exposed: Yes Fascia: No Tendon: No Muscle: No Joint: No Bone: No] [N/A:N/A] Epithelialization: Small (1-33%) N/A N/A Periwound Skin Texture: Scarring: Yes N/A N/A Excoriation: No Induration: No Callus: No Crepitus: No Rash: No Periwound Skin Moisture: Maceration: No N/A N/A Dry/Scaly: No Periwound Skin Color: Hemosiderin Staining: Yes N/A N/A Atrophie Blanche: No Cyanosis: No Ecchymosis: No Erythema: No Mottled: No Pallor: No Rubor: No Temperature: No Abnormality N/A N/A Tenderness on Palpation: Yes N/A N/A Wound Preparation: Ulcer Cleansing: N/A N/A Rinsed/Irrigated with Saline, Other: soap and water Topical Anesthetic Applied: None Treatment Notes Electronic Signature(s) Signed: 04/25/2018 5:28:58 PM By: Montey Hora Entered By: Montey Hora on 04/25/2018 08:39:17 Oyervides, [name]  ([id number]) -------------------------------------------------------------------------------- Multi-Disciplinary Care Plan Details Patient Name: [name] Date of Service: 04/25/2018 8:15 AM Medical Record Number: [id number] Patient Account Number: [id number] Date of Birth/Sex: 1951/01/04 (67 y.o. M) Treating RN: Montey Hora Primary Care Nolyn Eilert: Royetta Crochet Other Clinician: Referring Ireland Chagnon: Royetta Crochet Treating Lior Hoen/Extender: Melburn Hake, HOYT Weeks in Treatment: 26 Active Inactive Abuse / Safety / Falls / Self Care Management Nursing Diagnoses: Potential for falls Goals: Patient will not experience any injury related to falls Date Initiated: 10/25/2017 Target Resolution Date: 02/18/2018 Goal Status: Active Interventions: Assess Activities of Daily Living upon admission and as needed Assess fall risk on admission and as needed Assess: immobility, friction, shearing, incontinence upon admission and as needed Assess impairment of mobility on admission and as needed per policy Assess personal safety and home safety (as indicated) on admission and as needed Assess self care needs on admission and as needed Notes: Nutrition Nursing Diagnoses: Imbalanced nutrition Impaired glucose control: actual or potential Potential for alteratiion in Nutrition/Potential for imbalanced nutrition Goals: Patient/caregiver agrees to and verbalizes understanding of need to use nutritional supplements and/or vitamins as prescribed Date Initiated: 10/25/2017 Target Resolution Date: 02/18/2018 Goal Status: Active Patient/caregiver will maintain therapeutic glucose control Date Initiated: 10/25/2017 Target Resolution Date: 02/18/2018 Goal Status: Active Interventions: Assess patient nutrition upon admission and as needed per policy Provide education on elevated blood sugars and impact on wound healing Provide education on nutrition ANTOINETTE, HASKETT  ([id number]) Notes: Orientation to the Wound Care Program Nursing Diagnoses: Knowledge deficit related to the wound healing center program Goals: Patient/caregiver will verbalize understanding of the [facility] Date Initiated: 10/25/2017 Target Resolution Date: 11/19/2017 Goal Status: Active Interventions: Provide education on orientation to the wound center Notes: Wound/Skin Impairment Nursing Diagnoses: Impaired tissue integrity Knowledge deficit related to ulceration/compromised skin integrity Goals: Ulcer/skin breakdown will have a volume reduction of 80% by week 12 Date Initiated: 10/25/2017 Target Resolution Date: 02/11/2018 Goal Status: Active Interventions: Assess patient/caregiver ability to perform ulcer/skin care regimen upon admission and as needed Assess ulceration(s) every visit Notes: Electronic Signature(s) Signed: 04/25/2018 5:28:58 PM By: Montey Hora Entered By: Montey Hora on 04/25/2018 08:39:10 Rupnow, [name] ([id number]) -------------------------------------------------------------------------------- Pain Assessment Details Patient Name: [name] Date of Service: 04/25/2018 8:15 AM Medical Record Number: [id number] Patient Account Number: [id number] Date of Birth/Sex: [birth year] (67 y.o. M) Treating RN: Montey Hora Primary Care Carmelita Amparo: Royetta Crochet Other Clinician: Referring Su Duma: Royetta Crochet Treating Arles Rumbold/Extender: STONE III, HOYT Weeks in Treatment: 26 Active Problems Location of Pain Severity and Description of Pain Patient Has Paino Yes Site Locations Rate the pain. Current Pain Level: 6 Pain Management and Medication Current Pain Management: Electronic Signature(s) Signed: 04/25/2018 1:28:07 PM By: Paulla Fore, RRT, CHT Signed: 04/25/2018 5:28:58 PM By: Montey Hora Entered By:  Lorine Bears on 04/25/2018 08:17:00 XIAN, ALVES  ([id number]) -------------------------------------------------------------------------------- Patient/Caregiver Education Details Patient Name: JAIDEN, DINKINS Date of Service: 04/25/2018 8:15 AM Medical Record Number: [id number] Patient Account Number: [id number] Date of Birth/Gender: 1951/02/17 (67 y.o. M) Treating RN: Montey Hora Primary Care Physician: Royetta Crochet Other Clinician: Referring Physician: Royetta Crochet Treating Physician/Extender: Sharalyn Ink in Treatment: 70 Education Assessment Education Provided To: Patient Education Topics Provided Venous: Handouts: Other: leg elevation Methods: Explain/Verbal Responses: State content correctly Electronic Signature(s) Signed: 04/25/2018 5:28:58 PM By: Montey Hora Entered By: Montey Hora on 04/25/2018 08:39:56 Tabares, [name] ([id number]) -------------------------------------------------------------------------------- Wound Assessment Details Patient Name: [name] Date of Service: 04/25/2018 8:15 AM Medical Record Number: [id number] Patient Account Number: [id number] Date of Birth/Sex: 1950/12/29 (67 y.o. M) Treating RN: Harold Barban Primary Care Zeah Germano: Royetta Crochet Other Clinician: Referring Regine Christian: Royetta Crochet Treating Jhan Conery/Extender: STONE III, HOYT Weeks in Treatment: 26 Wound Status Wound Number: 8 Primary Diabetic Wound/Ulcer of the Lower Extremity Etiology: Wound Location: Right Lower Leg - Medial, Proximal Secondary Lymphedema Wounding Event: Gradually Appeared Etiology: Date Acquired: 10/17/2017 Wound Open Weeks Of Treatment: 26 Status: Clustered Wound: No Comorbid Chronic Obstructive Pulmonary Disease History: (COPD), Arrhythmia, Deep Vein Thrombosis, Hypertension, Type II Diabetes, Gout, Neuropathy Photos Photo Uploaded By: Harold Barban on 04/25/2018 17:03:31 Wound Measurements Length: (cm) 1.6 Width: (cm) 1.8 Depth: (cm) 0.3 Area: (cm)  2.262 Volume: (cm) 0.679 % Reduction in Area: -1270.9% % Reduction in Volume: -4143.7% Epithelialization: Small (1-33%) Tunneling: No Undermining: No Wound Description Classification: Grade 2 Wound Margin: Flat and Intact Exudate Amount: Large Exudate Type: Serosanguineous Exudate Color: red, brown Foul Odor After Cleansing: No Slough/Fibrino Yes Wound Bed Granulation Amount: Medium (34-66%) Exposed Structure Granulation Quality: Red, Pink Fascia Exposed: No Necrotic Amount: Medium (34-66%) Fat Layer (Subcutaneous Tissue) Exposed: Yes Necrotic Quality: Adherent Slough Tendon Exposed: No Muscle Exposed: No Joint Exposed: No Hoh, Deon ([id number]) Bone Exposed: No Periwound Skin Texture Texture Color No Abnormalities Noted: No No Abnormalities Noted: No Callus: No Atrophie Blanche: No Crepitus: No Cyanosis: No Excoriation: No Ecchymosis: No Induration: No Erythema: No Rash: No Hemosiderin Staining: Yes Scarring: Yes Mottled: No Pallor: No Moisture Rubor: No No Abnormalities Noted: No Dry / Scaly: No Temperature / Pain Maceration: No Temperature: No Abnormality Tenderness on Palpation: Yes Wound Preparation Ulcer Cleansing: Rinsed/Irrigated with Saline, Other: soap and water, Topical Anesthetic Applied: None Treatment Notes Wound #8 (Right, Proximal, Medial Lower Leg) Notes Apligraf applied in clinic today, hydrogel, drawtex, unna to anchor and 3 layer wrap Electronic Signature(s) Signed: 04/25/2018 5:05:35 PM By: Harold Barban Entered By: Harold Barban on 04/25/2018 08:29:13 Stillings, [name] ([id number]) -------------------------------------------------------------------------------- Vitals Details Patient Name: [name] Date of Service: 04/25/2018 8:15 AM Medical Record Number: [id number] Patient Account Number: [id number] Date of Birth/Sex: 1950/11/30 (67 y.o. M) Treating RN: Montey Hora Primary Care Cerita Rabelo: Royetta Crochet Other Clinician: Referring Jayjay Littles: Royetta Crochet Treating Ryen Rhames/Extender: STONE III, HOYT Weeks in Treatment: 26 Vital Signs Time Taken: 08:17 Temperature (F): 98.0 Height (in): 74 Pulse (bpm): 56 Weight (lbs): 233.6 Respiratory Rate (breaths/min): 18 Body Mass Index (BMI): 30 Blood Pressure (mmHg): 113/60 Reference Range: 80 - 120 mg / dl Electronic Signature(s) Signed: 04/25/2018 1:28:07 PM By: Lorine Bears RCP, RRT, CHT Entered By: Becky Sax, Amado Nash on 04/25/2018 08:19:49

## 2018-04-30 NOTE — Progress Notes (Signed)
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 further arterial studies that showed triphasic waveforms bilaterally. They also note subocclusive DVT and right posterior tibial and anterior tibial arteries not  visualized due to wound bandages which they unfortunately did not take off. Right lower extremity small vessel disease cannot be excluded due to limited visualization. There is of note that they want to follow-up with vascular lab study on 08/23/16. 3/7/ 18; patient comes in today with the wound bed in fairly good condition. No debridement. TheraSkin #1 08/04/16 no major change in wound dimensions although the base of this looks fairly healthy. No debridement TheraSkin #2 08/17/16- the patient is here for follow-up of a attenuation of his right lower Schmeltzer. He is status post 2 TheraSkin applications and he states he has an appointment for venous ablation with Dr.Dew on 4/13. 08/31/16; the patient had laser ablation by Dr. dew on 4/13. I think this involved both the greater and lesser saphenous veins. He tolerated this well. We have been putting TheraSkin on the wound every 2 weeks and he arrives with better-looking epithelialization today 09/14/16; the patient arrives today with an odor to his wound and some greenish necrotic surface over the wound approximately 70%. He had a small satellite lesion noted last week when we changed his dressing in between application of TheraSkin. I elected not to put that TheraSkin on today. 09/21/16; deterioration in the wound last week. I gave him empiric Cefdinir out of fear for a gram-negative infection although the CULTURE turned out to be negative. He completed his antibiotics this morning. Wound looks somewhat better, I put silver alginate on it last week again out of concern for infection. We do not have a TheraSkin this week not ordered last week 09/28/16; no major change from last week. We've looked over the volume of this wound and of not had major changes in spite of TheraSkin although the last TheraSkin was almost a month ago. We put silver alginate on last week out of fear of infection. I will switch to [facility] as looking over the records didn't  really suggest that theraskin had helped 10/12/16; we'll use Hydrofera Blue starting last week. No major change in the wound dimensions. 10/19/16; continue Hydrofera Blue. 1.8 x 2.5 x 0.2. Wound base looks healthy 10/26/16; continue with Hydrofera Blue. 1.5 x 2.4 x 0.2 11/02/16 no change in dimensions. Change from [facility] to Iodoflex 11/09/16; patient complains of increasing pain. Dimensions slightly larger. Last week I put Iodoflex on this wound to see if we can get a better surface I also increased him to 4 layer compression. He has not been systemically unwell. 11/16/16; patient states his leg feels better. He has completed his antibiotics. Dimensions are better. I change back to [facility] last week. We also change the dressing once on Friday which may have helped. Wound looks a lot better today than last week.. 2.2 x 2.4 x 0.3 11/23/16 on evaluation today patient's right lower extremity ulcer appears to be doing very well. He has been tolerating the [facility] Dressing and tells me that fortunately he is finally improvement. He is pleased with how this is progressing. CONSTANTINOS, [name] ([id number]) 11/30/16; improved using Hydrofera Blue 12/07/16; continue dramatic progress. Wound is now small and healthy looking. Continue use of Hydrofera Blue 12/14/16; very small wound albeit  with some depth. Continued use of Hydrofera blue. 12/21/16 on evaluation today patient's right lower extremity ulcer appears healed at this point. He is having no discomfort and overall this is doing very well. And there's no sign of infection. 04/26/17; READMISSION Patient we know from previous stays in this clinic. The patient has known diabetic PAD and has a stent in the right leg. 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 When he was last in the clinic in August we had healed him out for what was felt to be a mostly venous insufficiency wound on the medial right leg. He  follows with vascular surgery Dr. dew is at [place] [facility]. He has previously undergone successful laser ablation. Reflux studies on 4/24 showed reflux in the common femoral, femoral and popliteal. He underwent successful ablation of the right greater saphenous vein from the distal thigh to the mid calf level. Successful ablation of the right small saphenous vein. He had unsuccessful ablation of the right greater saphenous vein from the saphenofemoral junction to the distal 5 level. The patient wears to let her compression stockings and he has been compliant with this With regards to his diabetes he is not currently on any treatment. He does describe pain in his leg which forces him to stop although I couldn't really quantify this. His last arterial studies were on 07/05/16 which showed triphasic waveforms on the right to the level of the popliteal artery. The right posterior tibial and anterior tibial artery were not visualized on this study. I don't actually see ABIs or TBIs. 05/04/17; the patient has small wounds on the right medial heel and the right medial leg. The area on the foot is just about closed. The area on the right medial leg is improved. He is tolerating the 3 layer compression without any complaints. The patient has both known PAD and chronic venous reflux [see discussion above]. 05/11/17; the areas on the patient's medial right heel with healed he still has an open area on the right medial leg. This does not require debridement today I think I did read it last week. We are using silver collagen under 3 layer compression. 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 He has been tolerating the dressing changes without complication. With that being said he does have some epithelialization growing into the wound bed  and it appears this wound may be trying to healed in a depressed fashion. To be honest that is not the worst case scenario and I think this may definitely do fine even if it heals in that way. There was no requirement for debridement today as patient appears to be doing very well in regard to his ulcer. His swelling was a little bit more severe than last week but nothing too significant. 07/05/17 on evaluation today patient appears to be doing rather well in regard to his right medial lower extremity ulcer. The wound does appear to be healing although it is healing and somewhat of a cratered fashion the good news is he has excellent epithelium noted. Fortunately there does not appear to be any evidence of infection at this point he is tolerating the dressings as well is the wrap well. 07/12/17 on evaluation today patient appears to be doing very well in regard to his left medial lower extremity  ulcer. He has been tolerating the dressing changes without complication. In fact compared to last week this wound which was somewhat concave has fielding dramatically and looks to be doing excellent. I'm extremely happy with the progress. He still has pain rated to be a 4-5/10 but the wound looks great. 07/20/17; the patient's right medial lower extremity ulcer is closed. He has good edema control. Unfortunately he does not really have adequate compression stockings. 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 He has been treated with compression stockings for some time now in fact we have notes having treated him even as far back as 2016 here in our clinic. Fortunately he has excellent blood  flow and tends to heal well. Unfortunately despite wearing his compression stockings, keeping his legs elevated, being on fluid pills (Lasix) and doing everything he can to try to help with fluid he still continues to have issues with this reopening. I think that he may be a good candidate for compression/lymphedema pumps. We have never attempted to get these for him previously. No fevers, chills, nausea, or vomiting noted at this time. Currently patient states his ulcer does hurt although not as bad as some in the past it's also not as large as it has been in the past when it reopened he talked is somewhat early. Obviously this is good news. [name], [name] ([id number]) 12/13/17 on evaluation today patient appears to actually be doing very well in regard to his right medial ankle ulcer compared to last week's evaluation. I think having him on the correct antibiotics has made all the difference in the world. With that being said he's not having discomfort like he was previous and in general I feel like he has made excellent strides towards healing in the past week. No fevers, chills, nausea, or vomiting noted at this time. 01/10/18 on evaluation today patient appears to be doing a little worse compared to last week's evaluation. I think that though the Iodoflex has help with cleaning up the surface of the wound subsequently he has having a lot of drainage which I think is causing some issues in that regard. At this point I think we may want to switch the dressings. 01/17/18 on evaluation today patient appears to be doing rather well in regard to his right medial malleolus ulcer. The good news is I do feel like he showing some signs of improvement which is excellent. I feel like that the [facility] Dressing been better for him than the Iodoflex that we will previously utilizing. He is having much less in the way of discomfort which is good news. Overall very pleased in that regard. Nonetheless the  appearance of the wound is improved today as well and that is excellent news. He still has pain but not seemingly as much as previous and I don't feel like there's much maceration either. 01/23/18 on evaluation today patient appears in our clinic a day early for his appointment due to the fact that he was having discomfort and issues with his wrap. Upon inspection of the wound actually appears to be doing fairly well which is good news. With that being said he is having a lot of discomfort  at this point unfortunately. I think that this is mainly due to the fact that he is having such drainage as the wound bed itself seems to be doing well. I do believe the [facility] Dressing well is helpful for him which is good news. Nonetheless again I think he may need more frequent dressing changes. 01/31/18 on evaluation today patient actually appears to be doing very well in regard to his right medial ankle ulcer. This is significantly better than what was even noted last week I do think that the extra dressing change with the nurse visit toward the end of the week made a significant improvement for him. Overall I'm very pleased with how things appear today. 02/07/18 on evaluation today patient actually appears to be doing a little better in regard to his wound. The measurements are smaller today compared to previous. Fortunately there's no evidence of any infection and I do believe the moisture is much better control with more frequent wrap changes. Overall the patient seems to be making good progress. 02/14/18 on evaluation today patient actually appears to be doing very well in regard to his medial ankle ulcer of the right lower extremity. With that being said he saw slough buildup and this is still been somewhat slow to heal. For that reason my suggestion currently is gonna be that we may need to see about checking on a skin substitute possibly Apligraf to see if this will be of benefit for him. In the past  he has benefited from Apligraf. Overall there does not appear to be any evidence of infection at this time which is definitely good news. 02/28/18 on evaluation today patient's medial ankle ulcer on the right actually appears to be doing significantly better compared to previous. He has been tolerating the dressing changes without complication. Fortunately there appears to be no evidence of infection which is great news. Overall I do feel like that he has made improvement although there has been some delay in general and the wound is taken a little longer than I would've liked up to this point. Nonetheless there's no sign of infection and therefore I think that we can proceed with the Apligraf applications we actually got this approved for him. That something that I'm gonna see about ordering for next week. 03/07/18 on evaluation today patient unfortunately appears to be doing significantly worse than normal as far as his overall and general appearance is concerned. He has been tolerating the dressing changes without complication. With that being said nothing appears to be problematic in regard to his wound. The issue that I see right now involves more his general malaise and the fact that he does appear to have atrial fibrillation which is a known issue but unfortunately he is dropping low to the point of having 3-5 seconds of sustained pulses that are around 30 bpm. This was confirmed actually during evaluation today by myself. He's been having left lower quadrant pain and was actually in the emergency department on 17 October most recently. According to the notes reviewed it did not appear that anything was identified as far as a cause of the reason for this. It does not appear to the wound related which is good news. Nonetheless still I'm unsure of exactly what is going on but I am worried on top of everything else and the patient feeling lethargic he showed up late for his appointment today  which is very uncharacteristic for this gentleman. 03/15/18; the patient was sent to the ER  last week during his evaluation but he was not admitted. I have not reviewed the emergency room record. He states he feels better. He is here for application of Apligraf #1. The Apligraf we had would've expired tomorrow so he was brought in early 03/28/18 on evaluation today patient actually appears to be doing well in my pinion in regard to the overall appearance of his right lower Trinity ulcer. He has been tolerating the dressing changes without complication. Fortunately there does not appear to be any evidence of infection at this time which is also good news. He does seem to have tolerated the first Apligraf [name], [name] ([id number]) application. He is here for number two today. 04/11/18 upon evaluation today patient actually appears to be doing very well in regard to his right medial malleolus ulcer. He has been tolerating the dressing changes specifically the compression wrap without complication. The Apligraf does seem to be beneficial for him he showing signs of good improvement which is excellent news. Overall I'm very pleased even though the measurements have not dramatically improve the appearance of the wound has. 04/25/18 on evaluation today patient actually appears to be doing much better in regard to his right medial ankle ulcer. He has been tolerating the dressing changes without complication specifically the Apligraf and the compression wrap. At this point I think he has made great progress even since last week. Fortunately there does not appear to be any evidence of infection at this time. Patient History Information obtained from Patient. Family History Heart Disease - Mother,Father, Hypertension - Mother,Father, Stroke - Father, No family history of Cancer, Diabetes, Hereditary Spherocytosis, Thyroid Problems, Tuberculosis. Social History Current every day smoker, Marital  Status - Single, Alcohol Use - Never, Drug Use - No History, Caffeine Use - Daily. Medical History Hospitalization/Surgery History - 05/17/2012, [facility], CVA. - 03/17/2017, [facility], Pneumonia. Medical And Surgical History Notes Ear/Nose/Mouth/Throat difficulty speaking Respiratory Hospitalized for Pneumonia 02/2017 Neurologic CVA in 2014 Review of Systems (ROS) Constitutional Symptoms (General Health) Denies complaints or symptoms of Fever, Chills. Respiratory The patient has no complaints or symptoms. Cardiovascular Complains or has symptoms of LE edema. Psychiatric The patient has no complaints or symptoms. Objective Constitutional Well-nourished and well-hydrated in no acute distress. Vitals Time Taken: 8:17 AM, Height: 74 in, Weight: 233.6 lbs, BMI: 30, Temperature: 98.0 F, Pulse: 56 bpm, Respiratory Hausen, Argyle ([id number]) Rate: 18 breaths/min, Blood Pressure: 113/60 mmHg. Respiratory normal breathing without difficulty. Psychiatric this patient is able to make decisions and demonstrates good insight into disease process. Alert and Oriented x 3. pleasant and cooperative. General Notes: Patient's wound bed currently shows evidence of good granulation at this time he has excellent epithelialization noted as well. Overall I'm extremely pleased with what I'm seeing. I did sharply debride away the dead tissue from the surface of the wound before applying the next application of Apligraf to the wound bed today. The patient tolerated all this without complication he only had pain during the last part of the debridement where I cleared the surface lot from the wound bed. Integumentary (Hair, Skin) Wound #8 status is Open. Original cause of wound was Gradually Appeared. The wound is located on the Right,Proximal,Medial Lower Leg. The wound measures 1.6cm length x 1.8cm width x 0.3cm depth; 2.262cm^2 area and 0.679cm^3 volume. There is Fat Layer (Subcutaneous Tissue) Exposed  exposed. There is no tunneling or undermining noted. There is a large amount of serosanguineous drainage noted. The wound margin is flat and intact. There is medium (34-66%) red,  pink granulation within the wound bed. There is a medium (34-66%) amount of necrotic tissue within the wound bed including Adherent Slough. The periwound skin appearance exhibited: Scarring, Hemosiderin Staining. The periwound skin appearance did not exhibit: Callus, Crepitus, Excoriation, Induration, Rash, Dry/Scaly, Maceration, Atrophie Blanche, Cyanosis, Ecchymosis, Mottled, Pallor, Rubor, Erythema. Periwound temperature was noted as No Abnormality. The periwound has tenderness on palpation. 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 There was a Excisional Skin/Subcutaneous Tissue Debridement with a total area of 2.88 sq cm performed by STONE III, Shauntea Lok E., PA-C. With the following instrument(s): Curette to remove Viable and Non-Viable tissue/material. Material removed includes Subcutaneous Tissue, Slough, Skin: Dermis, and Skin: Epidermis after achieving pain control using Lidocaine 4% Topical Solution. No specimens were taken. A time out was conducted at 08:38, prior to the start of the procedure. A Minimum amount of bleeding was controlled with Pressure. The procedure was tolerated well with a pain level of 0 throughout and a pain level of 0 following the procedure. Post Debridement Measurements: 1.6cm length x 1.8cm width x 0.4cm depth; 0.905cm^3 volume. Character of Wound/Ulcer Post Debridement is  improved. Severity of Tissue Post Debridement is: Fat layer exposed. Schemm, [name] ([id number]) Post procedure Diagnosis Wound #8: Same as Pre-Procedure Pre-procedure diagnosis of Wound #8 is a Diabetic Wound/Ulcer of the Lower Extremity located on the Right,Proximal,Medial Lower Leg. A skin graft procedure using a bioengineered skin substitute/cellular or tissue based product was performed by STONE III, Yannis Broce E., PA-C with the following instrument(s): Forceps and Scissors. Apligraf was applied and secured with Steri-Strips. 22 sq cm of product was utilized and 22 sq cm was wasted due to wound size. Post Application, mepitel one was applied. A Time Out was conducted at 08:47, prior to the start of the procedure. The procedure was tolerated well with a pain level of 0 throughout and a pain level of 0 following the procedure. Post procedure Diagnosis Wound #8: Same as Pre-Procedure . Plan Wound Cleansing: Wound #8 Right,Proximal,Medial Lower Leg: Clean wound with Normal Saline. Cleanse wound with mild soap and water Anesthetic (add to Medication List): Wound #8 Right,Proximal,Medial Lower Leg: Topical Lidocaine 4% cream applied to wound bed prior to debridement (In Clinic Only). Skin Barriers/Peri-Wound Care: Wound #8 Right,Proximal,Medial Lower Leg: Moisturizing lotion - not on wound area Primary Wound Dressing: ABD Pad - hydrogel Dressing Change Frequency: Wound #8 Right,Proximal,Medial Lower Leg: Change dressing every week Follow-up Appointments: Wound #8 Right,Proximal,Medial Lower Leg: Return Appointment in 1 week. - Nurse Visit 05/02/18 and PA visit on 05/09/18 Nurse Visit as needed Edema Control: Wound #8 Right,Proximal,Medial Lower Leg: 3 Layer Compression System - Right Lower Extremity - unna to anchor Additional Orders / Instructions: Wound #8 Right,Proximal,Medial Lower Leg: Increase protein intake. Advanced Therapies: Wound #8 Right,Proximal,Medial Lower  Leg: Apligraf application in clinic; including contact layer, fixation with steri strips, dry gauze and cover dressing. I'm in a recommend at this point that we go ahead and continue with the above wound care measures. The patient is in agreement with plan. Please see above for specific wound care orders. We will see patient for re-evaluation in 2 week(s) here in the clinic. If anything  worsens or changes patient will contact our office for additional recommendations. Electronic Signature(s) ANTONY, SIAN ([id number]) Signed: 04/28/2018 8:01:46 PM By: Worthy Keeler PA-C Entered By: Worthy Keeler on 04/25/2018 08:57:09 Crislip, [name] ([id number]) -------------------------------------------------------------------------------- ROS/PFSH Details Patient Name: [name] Date of Service: 04/25/2018 8:15 AM Medical Record Number: [id number] Patient Account Number: [id number] Date of Birth/Sex: 05-Nov-1950 (67 y.o. M) Treating RN: Montey Hora Primary Care Provider: Royetta Crochet Other Clinician: Referring Provider: Royetta Crochet Treating Provider/Extender: Melburn Hake, Darric Plante Weeks in Treatment: 26 Information Obtained From Patient Wound History Do you currently have one or more open woundso Yes How many open wounds do you currently haveo 1 Approximately how long have you had your woundso 1 week How have you been treating your wound(s) until nowo lotion Has your wound(s) ever healed and then re-openedo Yes Have you had any lab work done in the past montho No Have you tested positive for an antibiotic resistant organism (MRSA, VRE)o No Have you tested positive for osteomyelitis (bone infection)o No Have you had any tests for circulation on your legso Yes Who ordered the testo [facility] Where was the test doneo [facility] Constitutional Symptoms (General Health) Complaints and Symptoms: Negative for: Fever; Chills Cardiovascular Complaints and Symptoms: Positive for: LE  edema Medical History: Positive for: Arrhythmia - a fib; Deep Vein Thrombosis; Hypertension Negative for: Angina; Congestive Heart Failure; Coronary Artery Disease; Hypotension; Myocardial Infarction; Peripheral Arterial Disease; Peripheral Venous Disease; Phlebitis; Vasculitis Eyes Medical History: Negative for: Cataracts; Glaucoma; Optic Neuritis Ear/Nose/Mouth/Throat Medical History: Negative for: Chronic sinus problems/congestion; Middle ear problems Past Medical History Notes: difficulty speaking Hematologic/Lymphatic Medical History: Negative for: Anemia; Hemophilia; Human Immunodeficiency Virus; Lymphedema; Sickle Cell Disease Respiratory Blandino, Marcelle ([id number]) Complaints and Symptoms: No Complaints or Symptoms Medical History: Positive for: Chronic Obstructive Pulmonary Disease (COPD) Negative for: Aspiration; Asthma; Pneumothorax; Sleep Apnea; Tuberculosis Past Medical History Notes: Hospitalized for Pneumonia 02/2017 Gastrointestinal Medical History: Negative for: Cirrhosis ; Colitis; Crohnos; Hepatitis A; Hepatitis B; Hepatitis C Endocrine Medical History: Positive for: Type II Diabetes Negative for: Type I Diabetes Time with diabetes: since 2017 Treated with: Diet Blood sugar tested every day: No Genitourinary Medical History: Negative for: End Stage Renal Disease Immunological Medical History: Negative for: Lupus Erythematosus; Raynaudos; Scleroderma Integumentary (Skin) Medical History: Negative for: History of Burn; History of pressure wounds Musculoskeletal Medical History: Positive for: Gout Negative for: Rheumatoid Arthritis; Osteoarthritis; Osteomyelitis Neurologic Medical History: Positive for: Neuropathy Negative for: Dementia; Quadriplegia; Paraplegia; Seizure Disorder Past Medical History Notes: CVA in 2014 Oncologic Medical History: Negative for: Received Chemotherapy; Received Radiation Psychiatric TOA, MIA  ([id number]) Complaints and Symptoms: No Complaints or Symptoms Medical History: Negative for: Anorexia/bulimia; Confinement Anxiety Immunizations Pneumococcal Vaccine: Received Pneumococcal Vaccination: Yes Immunization Notes: up to date Implantable Devices Hospitalization / Surgery History Name of Hospital Purpose of Hospitalization/Surgery Date [facility] 05/17/2012 [place] Pneumonia 03/17/2017 Family and Social History Cancer: No; Diabetes: No; Heart Disease: Yes - Mother,Father; Hereditary Spherocytosis: No; Hypertension: Yes - Mother,Father; Stroke: Yes - Father; Thyroid Problems: No; Tuberculosis: No; Current every day smoker; Marital Status - Single; Alcohol Use: Never; Drug Use: No History; Caffeine Use: Daily; Financial Concerns: No; Food, Clothing or Shelter Needs: No; Support System Lacking: No; Transportation Concerns: No; Advanced Directives: No; Patient does not want information on Advanced Directives Physician Affirmation I have reviewed and agree with the above information. Electronic Signature(s) Signed: 04/25/2018 5:28:58 PM By: Montey Hora Signed: 04/28/2018 8:01:46 PM By: Worthy Keeler PA-C Entered By: Montey Hora on 04/25/2018 08:56:56  JAN, OLANO ([id number]) -------------------------------------------------------------------------------- SuperBill Details Patient Name: ORLEN, LEEDY Date of Service: 04/25/2018 Medical Record Number: [id number] Patient Account Number: [id number] Date of Birth/Sex: 08/15/1950 (67 y.o. M) Treating RN: Montey Hora Primary Care Provider: Royetta Crochet Other Clinician: Referring Provider: Royetta Crochet Treating Provider/Extender: Melburn Hake, Deaglan Lile Weeks in Treatment: 26 Diagnosis Coding ICD-10 Codes Code Description I87.2 Venous insufficiency (chronic) (peripheral) I89.0 Lymphedema, not elsewhere classified E11.622 Type 2 diabetes mellitus with other skin ulcer L97.812 Non-pressure chronic ulcer of other  part of right lower leg with fat layer exposed Facility Procedures CPT4 Code Description: [id number] Q4101 (Facility Use Only) A[address]ig[address] 44 SQ CM Modifier: Quantity: 57 CPT4 Code Description: [id number] 15271 - SKIN SUB GRAFT TRNK/ARM/LEG ICD-10 Diagnosis Description Y50.354 Non-pressure chronic ulcer of other part of right lower leg wit Modifier: h fat layer expo Quantity: 1 sed Physician Procedures CPT4 Code Description: [id number] 15271 - WC PHYS SKIN SUB GRAFT TRNK/ARM/LEG ICD-10 Diagnosis Description N17.001 Non-pressure chronic ulcer of other part of right lower leg with Modifier: fat layer expo Quantity: 1 sed Electronic Signature(s) Signed: 04/28/2018 8:01:46 PM By: Worthy Keeler PA-C Entered By: Worthy Keeler on 04/25/2018 08:57:32

## 2018-05-02 DIAGNOSIS — E11621 Type 2 diabetes mellitus with foot ulcer: Secondary | ICD-10-CM | POA: Diagnosis not present

## 2018-05-09 ENCOUNTER — Encounter: Payer: Medicare Other | Admitting: Physician Assistant

## 2018-05-09 DIAGNOSIS — E11621 Type 2 diabetes mellitus with foot ulcer: Secondary | ICD-10-CM | POA: Diagnosis not present

## 2018-05-10 NOTE — Progress Notes (Signed)
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 know that he is seen him for recurrent wounds on his lower legs. I would like Dr. dew to review both his venous and arterial situation. Arterial Dopplers are probably in order. So I  had called him last month when a chest x-ray suggested mild heart failure and asked him to see his primary doctor I don't really see that he followed up with a doctor who is apparently in the [facility]. I would like this patient to follow-up with Dr. dew about the recurrent wounds on the right leg that are painful both an arterial and venous assessment. Will also try to set up an appointment with his primary physician. 07/07/16; The patient has been to see Dr. Lucky Cowboy although we don't have notes. Also been to see primary MD and has new "pills". States he feels better. Using [facility] 07/14/16; the patient is been to see Dr. dew. I think he had further arterial studies that showed triphasic waveforms bilaterally. They also note subocclusive DVT and right posterior tibial and anterior tibial arteries not visualized due to wound bandages which they unfortunately did not take off. Right lower extremity small vessel disease cannot be excluded due to limited visualization. There is of note that they want to follow-up with vascular lab study on 08/23/16. 3/7/ 18; patient comes in today with the wound bed in fairly good condition. No debridement. TheraSkin #1 08/04/16 no major change in wound dimensions although the base of this looks fairly healthy. No debridement TheraSkin #2 08/17/16- the patient is here for follow-up of a attenuation of his right lower Schmeltzer. He is status post 2 TheraSkin applications and he states he has an appointment for venous ablation with Dr.Dew on 4/13. 08/31/16; the patient had laser ablation by Dr. dew on 4/13. I think this involved both the greater and lesser saphenous veins. He tolerated this well. We have been putting TheraSkin on the wound every 2 weeks and he arrives with better-looking epithelialization today 09/14/16; the patient arrives today with an odor to his wound and some greenish necrotic surface over the wound approximately 70%. He had a small satellite lesion  noted last week when we changed his dressing in between application of TheraSkin. I elected not to put that TheraSkin on today. 09/21/16; deterioration in the wound last week. I gave him empiric Cefdinir out of fear for a gram-negative infection although the CULTURE turned out to be negative. He completed his antibiotics this morning. Wound looks somewhat better, I put silver alginate on it last week again out of concern for infection. We do not have a TheraSkin this week not ordered last week 09/28/16; no major change from last week. We've looked over the volume of this wound and of not had major changes in spite of TheraSkin although the last TheraSkin was almost a month ago. We put silver alginate on last week out of fear of infection. I will switch to [facility] as looking over the records didn't really suggest that theraskin had helped 10/12/16; we'll use Hydrofera Blue starting last week. No major change in the wound dimensions. 10/19/16; continue Hydrofera Blue. 1.8 x 2.5 x 0.2. Wound base looks healthy 10/26/16; continue with Hydrofera Blue. 1.5 x 2.4 x 0.2 11/02/16 no change in dimensions. Change from [facility] to Iodoflex 11/09/16; patient complains of increasing pain. Dimensions slightly larger. Last week I put Iodoflex on this wound to see if we can get a better surface I also increased him to  4 layer compression. He has not been systemically unwell. 11/16/16; patient states his leg feels better. He has completed his antibiotics. Dimensions are better. I change back to [name] last week. We also change the dressing once on Friday which may have helped. Wound looks a lot better today than last week.. 2.2 x 2.4 x 0.3 11/23/16 on evaluation today patient's right lower extremity ulcer appears to be doing very well. He has been tolerating the [facility] Dressing and tells me that fortunately he is finally improvement. He is pleased with how this is progressing. TIMUR, NIBERT  ([id number]) 11/30/16; improved using Hydrofera Blue 12/07/16; continue dramatic progress. Wound is now small and healthy looking. Continue use of Hydrofera Blue 12/14/16; very small wound albeit with some depth. Continued use of Hydrofera blue. 12/21/16 on evaluation today patient's right lower extremity ulcer appears healed at this point. He is having no discomfort and overall this is doing very well. And there's no sign of infection. 04/26/17; READMISSION Patient we know from previous stays in this clinic. The patient has known diabetic PAD and has a stent in the right leg. 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 When he was last in the clinic in August we had healed him out for what was felt to be a mostly venous insufficiency wound on the medial right leg. He follows with vascular surgery Dr. dew is at [place] [facility]. He has previously undergone successful laser ablation. Reflux studies on 4/24 showed reflux in the common femoral, femoral and popliteal. He underwent successful ablation of the right greater saphenous vein from the distal thigh to the mid calf level. Successful ablation of the right small saphenous vein. He had unsuccessful ablation of the right greater saphenous vein from the saphenofemoral junction to the distal 5 level. The patient wears to let her compression stockings and he has been compliant with this With regards to his diabetes he is not currently on any treatment. He does describe pain in his leg which forces him to stop although I couldn't really quantify this. His last arterial studies were on 07/05/16 which showed triphasic waveforms on the right to the level of the popliteal artery. The right posterior tibial and anterior tibial artery were not visualized on this study. I don't actually see ABIs or TBIs. 05/04/17; the patient has small wounds on the right medial heel and the right medial leg. The area on the foot is just  about closed. The area on the right medial leg is improved. He is tolerating the 3 layer compression without any complaints. The patient has both known PAD and chronic venous reflux [see discussion above]. 05/11/17; the areas on the patient's medial right heel with healed he still has an open area on the right medial leg. This does not require debridement today I think I did read it last week. We are using silver collagen under 3 layer compression. 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 He has been tolerating the dressing changes without complication. With that being said he does have some epithelialization growing into the wound bed and it appears this wound may be trying to healed in a depressed fashion. To be honest that is not the worst case scenario and I think this may definitely do fine even if it heals in  that way. There was no requirement for debridement today as patient appears to be doing very well in regard to his ulcer. His swelling was a little bit more severe than last week but nothing too significant. 07/05/17 on evaluation today patient appears to be doing rather well in regard to his right medial lower extremity ulcer. The wound does appear to be healing although it is healing and somewhat of a cratered fashion the good news is he has excellent epithelium noted. Fortunately there does not appear to be any evidence of infection at this point he is tolerating the dressings as well is the wrap well. 07/12/17 on evaluation today patient appears to be doing very well in regard to his left medial lower extremity ulcer. He has been tolerating the dressing changes without complication. In fact compared to last week this wound which was somewhat concave has fielding dramatically and looks to be doing excellent. I'm  extremely happy with the progress. He still has pain rated to be a 4-5/10 but the wound looks great. 07/20/17; the patient's right medial lower extremity ulcer is closed. He has good edema control. Unfortunately he does not really have adequate compression stockings. 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 He has been treated with compression stockings for some time now in fact we have notes having treated him even as far back as 2016 here in our clinic. Fortunately he has excellent blood flow and tends to heal well. Unfortunately despite wearing his compression stockings, keeping his legs elevated, being on fluid pills (Lasix) and doing everything he can to try to help with fluid he still continues to have issues with this reopening. I think that he may be a good candidate for compression/lymphedema pumps. We have never attempted to get these for him previously. No fevers, chills, nausea, or vomiting noted at this time. Currently patient states his ulcer does hurt although not as bad as some in the past it's also not as large as it has been in the past when it reopened he talked is somewhat early. Obviously this is good news. HARJIT, LEIDER ([id number]) 12/13/17 on evaluation today patient appears to actually be doing very well in regard to his right medial ankle ulcer compared to last week's evaluation. I think having him on the correct antibiotics has made all the difference in the world. With that being said he's not having discomfort like he was previous and in general I feel like he has made excellent strides towards healing in the past week. No fevers, chills, nausea,  or vomiting noted at this time. 01/10/18 on evaluation today patient appears to be doing a little worse compared to last week's evaluation. I think that though the Iodoflex has help with cleaning up the surface of the wound subsequently he has having a lot of drainage which I think is causing some issues in that regard. At this point I think we may want to switch the dressings. 01/17/18 on evaluation today patient appears to be doing rather well in regard to his right medial malleolus ulcer. The good news is I do feel like he showing some signs of improvement which is excellent. I feel like that the  Hydrofera Blue Dressing been better for him than the Iodoflex that we will previously utilizing. He is having much less in the way of discomfort which is good news. Overall very pleased in that regard. Nonetheless the appearance of the wound is improved today as well and that is excellent news. He still has pain but not seemingly as much as previous and I don't feel like there's much maceration either. 01/23/18 on evaluation today patient appears in our clinic a day early for his appointment due to the fact that he was having discomfort and issues with his wrap. Upon inspection of the wound actually appears to be doing fairly well which is good news. With that being said he is having a lot of discomfort at this point unfortunately. I think that this is mainly due to the fact that he is having such drainage as the wound bed itself seems to be doing well. I do believe the [facility] Dressing well is helpful for him which is good news. Nonetheless again I think he may need more frequent dressing changes. 01/31/18 on evaluation today patient actually appears to be doing very well in regard to his right medial ankle ulcer. This is significantly better than what was even noted last week I do think that the extra dressing change with the nurse visit toward the end of the week made a significant improvement for  him. Overall I'm very pleased with how things appear today. 02/07/18 on evaluation today patient actually appears to be doing a little better in regard to his wound. The measurements are smaller today compared to previous. Fortunately there's no evidence of any infection and I do believe the moisture is much better control with more frequent wrap changes. Overall the patient seems to be making good progress. 02/14/18 on evaluation today patient actually appears to be doing very well in regard to his medial ankle ulcer of the right lower extremity. With that being said he saw slough buildup and this is still been somewhat slow to heal. For that reason my suggestion currently is gonna be that we may need to see about checking on a skin substitute possibly Apligraf to see if this will be of benefit for him. In the past he has benefited from Apligraf. Overall there does not appear to be any evidence of infection at this time which is definitely good news. 02/28/18 on evaluation today patient's medial ankle ulcer on the right actually appears to be doing significantly better compared to previous. He has been tolerating the dressing changes without complication. Fortunately there appears to be no evidence of infection which is great news. Overall I do feel like that he has made improvement although there has been some delay in general and the wound is taken a little longer than I would've liked up to this point. Nonetheless there's no sign of infection and therefore I think that we can proceed with the Apligraf applications we actually got this approved for him. That something that I'm gonna see about ordering for next week. 03/07/18 on evaluation today patient unfortunately appears to be doing significantly worse than normal as far as his overall and general appearance is concerned. He has been tolerating the dressing changes without complication. With that being said nothing appears to be problematic in  regard to his wound. The issue that I see right now involves more his general malaise and the fact that he does appear to have atrial fibrillation which is a known issue but unfortunately he is  dropping low to the point of having 3-5 seconds of sustained pulses that are around 30 bpm. This was confirmed actually during evaluation today by myself. He's been having left lower quadrant pain and was actually in the emergency department on 17 October most recently. According to the notes reviewed it did not appear that anything was identified as far as a cause of the reason for this. It does not appear to the wound related which is good news. Nonetheless still I'm unsure of exactly what is going on but I am worried on top of everything else and the patient feeling lethargic he showed up late for his appointment today which is very uncharacteristic for this gentleman. 03/15/18; the patient was sent to the ER last week during his evaluation but he was not admitted. I have not reviewed the emergency room record. He states he feels better. He is here for application of Apligraf #1. The Apligraf we had would've expired tomorrow so he was brought in early 03/28/18 on evaluation today patient actually appears to be doing well in my pinion in regard to the overall appearance of his right lower Trinity ulcer. He has been tolerating the dressing changes without complication. Fortunately there does not appear to be any evidence of infection at this time which is also good news. He does seem to have tolerated the first Apligraf Exantus, Khalil ([id number]) application. He is here for number two today. 04/11/18 upon evaluation today patient actually appears to be doing very well in regard to his right medial malleolus ulcer. He has been tolerating the dressing changes specifically the compression wrap without complication. The Apligraf does seem to be beneficial for him he showing signs of good improvement which  is excellent news. Overall I'm very pleased even though the measurements have not dramatically improve the appearance of the wound has. 04/25/18 on evaluation today patient actually appears to be doing much better in regard to his right medial ankle ulcer. He has been tolerating the dressing changes without complication specifically the Apligraf and the compression wrap. At this point I think he has made great progress even since last week. Fortunately there does not appear to be any evidence of infection at this time. 05/09/18 on evaluation today patient appears to be doing very well in regard to his lower Trinity ulcer. In fact this has made great progress since last time I seen him there's just one small area remaining this is at the top where it was worse. Fortunately there is no sign of infection and I do believe he's making great progress. This is his last application of the Apligraf today. Patient History Information obtained from Patient. Family History Heart Disease - Mother,Father, Hypertension - Mother,Father, Stroke - Father, No family history of Cancer, Diabetes, Hereditary Spherocytosis, Thyroid Problems, Tuberculosis. Social History Current every day smoker, Marital Status - Single, Alcohol Use - Never, Drug Use - No History, Caffeine Use - Daily. Medical History Hospitalization/Surgery History - 05/17/2012, [facility], CVA. - 03/17/2017, [facility], Pneumonia. Medical And Surgical History Notes Ear/Nose/Mouth/Throat difficulty speaking Respiratory Hospitalized for Pneumonia 02/2017 Neurologic CVA in 2014 Review of Systems (ROS) Constitutional Symptoms (General Health) Denies complaints or symptoms of Fever, Chills. Respiratory The patient has no complaints or symptoms. Cardiovascular Complains or has symptoms of LE edema. Psychiatric The patient has no complaints or symptoms. Objective Gunderson, Shigeru ([id number]) Constitutional Well-nourished and well-hydrated in no acute  distress. Vitals Time Taken: 8:16 AM, Height: 74 in, Weight: 220 lbs, BMI: 28.2, Temperature: 97.6 F, Pulse:  60 bpm, Respiratory Rate: 18 breaths/min, Blood Pressure: 126/66 mmHg. Respiratory normal breathing without difficulty. Psychiatric this patient is able to make decisions and demonstrates good insight into disease process. Alert and Oriented x 3. pleasant and cooperative. General Notes: Patient's wound bed currently did require some sharp debridement to clear away the necrotic tissue from the surface of the wound including exudate, Slough, and a little eschar around the edge. Nonetheless I think that post debridement this appear to be doing excellent there was a lot of new skin and epithelialization around the wound. The lower portion of the wound appears to be completely healed which is great news. Once everything was cleared away in preparation for application of the next Apligraf this was then applied and secured with [employer] followed by Steri-Strips. He tolerated all this will only minimal discomfort during the debridement. Integumentary (Hair, Skin) Wound #8 status is Open. Original cause of wound was Gradually Appeared. The wound is located on the Right,Proximal,Medial Lower Leg. The wound measures 0.7cm length x 0.9cm width x 0.2cm depth; 0.495cm^2 area and 0.099cm^3 volume. There is Fat Layer (Subcutaneous Tissue) Exposed exposed. There is no tunneling or undermining noted. There is a small amount of serous drainage noted. The wound margin is flat and intact. There is large (67-100%) red, pink granulation within the wound bed. There is a small (1-33%) amount of necrotic tissue within the wound bed including Adherent Slough. The periwound skin appearance exhibited: Scarring, Hemosiderin Staining. The periwound skin appearance did not exhibit: Callus, Crepitus, Excoriation, Induration, Rash, Dry/Scaly, Maceration, Atrophie Blanche, Cyanosis, Ecchymosis, Mottled, Pallor, Rubor,  Erythema. Periwound temperature was noted as No Abnormality. The periwound has tenderness on palpation. 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 There was a Excisional Skin/Subcutaneous Tissue Debridement with a total area of 0.63 sq cm performed by STONE III, Nikan Ellingson E., PA-C. With the following instrument(s): Falk, Tyee ([id number]) Curette to remove Viable and Non-Viable tissue/material. Material removed includes Subcutaneous Tissue, Slough, and Fibrin/Exudate after achieving pain control using Lidocaine 4% Topical Solution. No specimens were taken. A time out was conducted at 08:32, prior to the start of the procedure. A Minimum amount of bleeding was controlled with Pressure. The procedure was tolerated well with a pain level of 0 throughout and a pain level of 0 following the procedure. Post Debridement Measurements: 0.7cm length x 0.9cm width x 0.3cm depth; 0.148cm^3 volume. Character of Wound/Ulcer Post Debridement is improved. Severity of Tissue Post Debridement is: Fat layer exposed. Post procedure Diagnosis Wound #8: Same as Pre-Procedure Pre-procedure diagnosis of Wound #8 is a Diabetic Wound/Ulcer of the Lower Extremity located on the Right,Proximal,Medial Lower Leg. A skin graft procedure using a bioengineered skin substitute/cellular or tissue based product was performed by STONE III, Lark Runk E., PA-C with the following instrument(s): Forceps and Scissors. Apligraf was applied and secured with Steri-Strips. 11 sq cm of product was utilized and 33 sq cm was wasted due  to wound size. Post Application, mepitel one was applied. A Time Out was conducted at 08:42, prior to the start of the procedure. The procedure was tolerated well with a pain level of 0 throughout and a pain level of 0  following the procedure. Post procedure Diagnosis Wound #8: Same as Pre-Procedure . Plan Wound Cleansing: Wound #8 Right,Proximal,Medial Lower Leg: Clean wound with Normal Saline. Cleanse wound with mild soap and water Anesthetic (add to Medication List): Wound #8 Right,Proximal,Medial Lower Leg: Topical Lidocaine 4% cream applied to wound bed prior to debridement (In Clinic Only). Skin Barriers/Peri-Wound Care: Wound #8 Right,Proximal,Medial Lower Leg: Moisturizing lotion - not on wound area Primary Wound Dressing: ABD Pad - hydrogel Dressing Change Frequency: Wound #8 Right,Proximal,Medial Lower Leg: Change dressing every week Follow-up Appointments: Wound #8 Right,Proximal,Medial Lower Leg: Return Appointment in 1 week. - Nurse Visit 05/16/18 and PA visit on 05/23/2018 Nurse Visit as needed Edema Control: Wound #8 Right,Proximal,Medial Lower Leg: 3 Layer Compression System - Right Lower Extremity - unna to anchor Additional Orders / Instructions: Wound #8 Right,Proximal,Medial Lower Leg: Increase protein intake. Advanced Therapies: Wound #8 Right,Proximal,Medial Lower Leg: Apligraf application in clinic; including contact layer, fixation with steri strips, dry gauze and cover dressing. I'm gonna recommend currently that we continue with the above wound care measures for the next week with the Apligraf. Will see him for nurse visit one week and then subsequently I'll see you back for a physician visit in two weeks time. I'm hoping that he will be completely healed at that point. Reece, [name] ([id number]) Please see above for specific wound care orders. We will see patient for re-evaluation in 1 week(s) here in the clinic. If anything worsens or changes  patient will contact our office for additional recommendations. Electronic Signature(s) Signed: 05/09/2018 1:28:21 PM By: Worthy Keeler PA-C Entered By: Worthy Keeler on 05/09/2018 08:49:33 Bevis, [name] -------------------------------------------------------------------------------- ROS/PFSH Details Patient Name: [name] Date of Service: 05/09/2018 8:15 AM Medical Record Number: [id number] Patient Account Number: [id number] Date of Birth/Sex: 03-Jan-1951 (67 y.o. M) Treating RN: Montey Hora Primary Care Provider: Royetta Crochet Other Clinician: Referring Provider: Royetta Crochet Treating Provider/Extender: Melburn Hake, Garielle Mroz Weeks in Treatment: 28 Information Obtained From Patient Wound History Do you currently have one or more open woundso Yes How many open wounds do you currently haveo 1 Approximately how long have you had your woundso 1 week How have you been treating your wound(s) until nowo lotion Has your wound(s) ever healed and then re-openedo Yes Have you had any lab work done in the past montho No Have you tested positive for an antibiotic resistant organism (MRSA, VRE)o No Have you tested positive for osteomyelitis (bone infection)o No Have you had any tests for circulation on your legso Yes Who ordered the testo [facility] Where was the test doneo [facility] Constitutional Symptoms (General Health) Complaints and Symptoms: Negative for: Fever; Chills Cardiovascular Complaints and Symptoms: Positive for: LE edema Medical History: Positive for: Arrhythmia - a fib; Deep Vein Thrombosis; Hypertension Negative for: Angina; Congestive Heart Failure; Coronary Artery Disease; Hypotension; Myocardial Infarction; Peripheral Arterial Disease; Peripheral Venous Disease; Phlebitis; Vasculitis Eyes Medical History: Negative for: Cataracts; Glaucoma; Optic Neuritis Ear/Nose/Mouth/Throat Medical History: Negative for: Chronic sinus problems/congestion;  Middle ear problems Past Medical History Notes: difficulty speaking Hematologic/Lymphatic Medical History: Negative for: Anemia; Hemophilia; Human Immunodeficiency Virus; Lymphedema; Sickle Cell Disease Respiratory Dupre, Daxtin ([id number]) Complaints and Symptoms: No Complaints or Symptoms Medical History: Positive for: Chronic Obstructive Pulmonary Disease (COPD) Negative for: Aspiration; Asthma; Pneumothorax; Sleep Apnea; Tuberculosis Past Medical History Notes: Hospitalized for Pneumonia 02/2017 Gastrointestinal Medical History: Negative for: Cirrhosis ; Colitis; Crohnos; Hepatitis A; Hepatitis B; Hepatitis C Endocrine Medical History: Positive for: Type II Diabetes Negative for: Type I  Diabetes Time with diabetes: since 2017 Treated with: Diet Blood sugar tested every day: No Genitourinary Medical History: Negative for: End Stage Renal Disease Immunological Medical History: Negative for: Lupus Erythematosus; Raynaudos; Scleroderma Integumentary (Skin) Medical History: Negative for: History of Burn; History of pressure wounds Musculoskeletal Medical History: Positive for: Gout Negative for: Rheumatoid Arthritis; Osteoarthritis; Osteomyelitis Neurologic Medical History: Positive for: Neuropathy Negative for: Dementia; Quadriplegia; Paraplegia; Seizure Disorder Past Medical History Notes: CVA in 2014 Oncologic Medical History: Negative for: Received Chemotherapy; Received Radiation Psychiatric ALEM, [facility] ([id number]) Complaints and Symptoms: No Complaints or Symptoms Medical History: Negative for: Anorexia/bulimia; Confinement Anxiety Immunizations Pneumococcal Vaccine: Received Pneumococcal Vaccination: Yes Immunization Notes: up to date Implantable Devices Hospitalization / Surgery History Name of Hospital Purpose of Hospitalization/Surgery Date [place] CVA 05/17/2012 [place] Pneumonia 03/17/2017 Family and Social History Cancer: No; Diabetes:  No; Heart Disease: Yes - Mother,Father; Hereditary Spherocytosis: No; Hypertension: Yes - Mother,Father; Stroke: Yes - Father; Thyroid Problems: No; Tuberculosis: No; Current every day smoker; Marital Status - Single; Alcohol Use: Never; Drug Use: No History; Caffeine Use: Daily; Financial Concerns: No; Food, Clothing or Shelter Needs: No; Support System Lacking: No; Transportation Concerns: No; Advanced Directives: No; Patient does not want information on Advanced Directives Physician Affirmation I have reviewed and agree with the above information. Electronic Signature(s) Signed: 05/09/2018 1:28:21 PM By: Worthy Keeler PA-C Signed: 05/09/2018 1:47:23 PM By: Montey Hora Entered By: Worthy Keeler on 05/09/2018 08:48:35 Ono, [name] ([id number]) -------------------------------------------------------------------------------- SuperBill Details Patient Name: [name] Date of Service: 05/09/2018 Medical Record Number: [id number] Patient Account Number: [id number] Date of Birth/Sex: 09/29/1950 (67 y.o. M) Treating RN: Montey Hora Primary Care Provider: Royetta Crochet Other Clinician: Referring Provider: Royetta Crochet Treating Provider/Extender: Melburn Hake, Clare Fennimore Weeks in Treatment: 28 Diagnosis Coding ICD-10 Codes Code Description I87.2 Venous insufficiency (chronic) (peripheral) I89.0 Lymphedema, not elsewhere classified E11.622 Type 2 diabetes mellitus with other skin ulcer L97.812 Non-pressure chronic ulcer of other part of right lower leg with fat layer exposed Facility Procedures CPT4 Code Description: 38453646 Q4101 (Facility Use Only) Apligraf 44 SQ CM Modifier: Quantity: 105 CPT4 Code Description: 80321224 15271 - SKIN SUB GRAFT TRNK/ARM/LEG ICD-10 Diagnosis Description M25.003 Non-pressure chronic ulcer of other part of right lower leg wit Modifier: h fat layer expo Quantity: 1 sed Physician Procedures CPT4 Code Description: 7048889 15271 - WC PHYS  SKIN SUB GRAFT TRNK/ARM/LEG ICD-10 Diagnosis Description V69.450 Non-pressure chronic ulcer of other part of right lower leg with Modifier: fat layer expo Quantity: 1 sed Electronic Signature(s) Signed: 05/09/2018 1:28:21 PM By: Worthy Keeler PA-C Entered By: Worthy Keeler on 05/09/2018 08:49:43

## 2018-05-10 NOTE — Progress Notes (Signed)
KLAYTEN, JOLLIFF ([id number]) Visit Report for 05/09/2018 Arrival Information Details Patient Name: [name], [name] Date of Service: 05/09/2018 8:15 AM Medical Record Number: [id number] Patient Account Number: [id number] Date of Birth/Sex: 12/02/1950 (67 y.o. M) Treating RN: Cornell Barman Primary Care Sonji Starkes: Royetta Crochet Other Clinician: Referring Dene Nazir: Royetta Crochet Treating Nafeesah Lapaglia/Extender: Melburn Hake, HOYT Weeks in Treatment: 28 Visit Information History Since Last Visit Added or deleted any medications: No Patient Arrived: Ambulatory Any new allergies or adverse reactions: No Arrival Time: 08:16 Had a fall or experienced change in No Accompanied By: self activities of daily living that may affect Transfer Assistance: None risk of falls: Patient Identification Verified: Yes Signs or symptoms of abuse/neglect since last visito No Secondary Verification Process Yes Hospitalized since last visit: No Completed: Implantable device outside of the clinic excluding No Patient Requires Transmission-Based No cellular tissue based products placed in the center Precautions: since last visit: Patient Has Alerts: Yes Has Dressing in Place as Prescribed: Yes Patient Alerts: Patient on Blood Has Compression in Place as Prescribed: Yes Thinner Pain Present Now: Yes DMII warfarin Electronic Signature(s) Signed: 05/09/2018 1:27:23 PM By: Gretta Cool, [name], RN, CWS, [name], [name] Entered By: Gretta Cool, [name], RN, CWS, Kim on 05/09/2018 08:16:31 Whisman, [name] ([id number]) -------------------------------------------------------------------------------- Complex / Palliative Patient Assessment Details Patient Name: [name] Date of Service: 05/09/2018 8:15 AM Medical Record Number: [id number] Patient Account Number: [id number] Date of Birth/Sex: 04-29-1951 (67 y.o. M) Treating RN: Montey Hora Primary Care Johnathen Testa: Royetta Crochet Other Clinician: Referring Wenda Vanschaick:  Royetta Crochet Treating Jonnathan Birman/Extender: Melburn Hake, HOYT Weeks in Treatment: 28 Palliative Management Criteria Complex Wound Management Criteria Patient has remarkable or complex co-morbidities requiring medications or treatments that extend wound healing times. Examples: o Diabetes mellitus with chronic renal failure or end stage renal disease requiring dialysis o Advanced or poorly controlled rheumatoid arthritis o Diabetes mellitus and end stage chronic obstructive pulmonary disease o Active cancer with current chemo- or radiation therapy lymphedema, COPD, a fib, DMII with neuropathy Care Approach Wound Care Plan: Complex Wound Management Electronic Signature(s) Signed: 05/09/2018 1:28:21 PM By: Worthy Keeler PA-C Signed: 05/09/2018 1:47:23 PM By: Montey Hora Entered By: Montey Hora on 05/09/2018 08:55:06 Karpf, [name] ([id number]) -------------------------------------------------------------------------------- Encounter Discharge Information Details Patient Name: [name] Date of Service: 05/09/2018 8:15 AM Medical Record Number: [id number] Patient Account Number: [id number] Date of Birth/Sex: 1950/09/02 (67 y.o. M) Treating RN: Harold Barban Primary Care Isaura Schiller: Royetta Crochet Other Clinician: Referring Juanda Luba: Royetta Crochet Treating Kerianna Rawlinson/Extender: Melburn Hake, HOYT Weeks in Treatment: 28 Encounter Discharge Information Items Post Procedure Vitals Discharge Condition: Stable Temperature (F): 97.6 Ambulatory Status: Ambulatory Pulse (bpm): 60 Discharge Destination: Home Respiratory Rate (breaths/min): 18 Transportation: Private Auto Blood Pressure (mmHg): 126/66 Accompanied By: self Schedule Follow-up Appointment: Yes Clinical Summary of Care: Electronic Signature(s) Signed: 05/09/2018 1:37:43 PM By: Harold Barban Entered By: Harold Barban on 05/09/2018 09:00:58 Shiner, [name]  ([id number]) -------------------------------------------------------------------------------- Lower Extremity Assessment Details Patient Name: [name] Date of Service: 05/09/2018 8:15 AM Medical Record Number: [id number] Patient Account Number: [id number] Date of Birth/Sex: July 17, 1950 (67 y.o. M) Treating RN: Cornell Barman Primary Care Marlow Berenguer: Royetta Crochet Other Clinician: Referring Florestine Carmical: Royetta Crochet Treating Challen Spainhour/Extender: Melburn Hake, HOYT Weeks in Treatment: 28 Vascular Assessment Pulses: Dorsalis Pedis Palpable: [Right:Yes] Posterior Tibial Extremity colors, hair growth, and conditions: Extremity Color: [Right:Hyperpigmented] Hair Growth on Extremity: [Right:No] Temperature of Extremity: [Right:Warm] Capillary Refill: [Right:< 3 seconds] Toe Nail Assessment Left: Right: Thick: Yes Discolored: Yes Deformed: Yes Improper Length and Hygiene: Yes Electronic Signature(s) Signed: 05/09/2018 1:27:23  PM By: Gretta Cool, [name], RN, CWS, [name], [name] Entered By: Gretta Cool, [name], RN, CWS, Kim on 05/09/2018 08:27:52 Rohl, [name] ([id number]) -------------------------------------------------------------------------------- Multi Wound Chart Details Patient Name: [name] Date of Service: 05/09/2018 8:15 AM Medical Record Number: [id number] Patient Account Number: [id number] Date of Birth/Sex: 02/28/1951 (67 y.o. M) Treating RN: Montey Hora Primary Care Arihaan Bellucci: Royetta Crochet Other Clinician: Referring Rebeca Valdivia: Royetta Crochet Treating Mirko Tailor/Extender: STONE III, HOYT Weeks in Treatment: 28 Vital Signs Height(in): 74 Pulse(bpm): 60 Weight(lbs): 220 Blood Pressure(mmHg): 126/66 Body Mass Index(BMI): 28 Temperature(F): 97.6 Respiratory Rate 18 (breaths/min): Photos: [8:No Photos] [N/A:N/A] Wound Location: [8:Right Lower Leg - Medial, Proximal] [N/A:N/A] Wounding Event: [8:Gradually Appeared] [N/A:N/A] Primary Etiology: [8:Diabetic Wound/Ulcer of  the Lower Extremity] [N/A:N/A] Secondary Etiology: [8:Lymphedema] [N/A:N/A] Comorbid History: [8:Chronic Obstructive Pulmonary Disease (COPD), Arrhythmia, Deep Vein Thrombosis, Hypertension, Type II Diabetes, Gout, Neuropathy] [N/A:N/A] Date Acquired: [8:10/17/2017] [N/A:N/A] Weeks of Treatment: [8:28] [N/A:N/A] Wound Status: [8:Open] [N/A:N/A] Measurements L x W x D [8:0.7x0.9x0.2] [N/A:N/A] (cm) Area (cm) : [8:0.495] [N/A:N/A] Volume (cm) : [8:0.099] [N/A:N/A] % Reduction in Area: [8:-200.00%] [N/A:N/A] % Reduction in Volume: [8:-518.70%] [N/A:N/A] Classification: [8:Grade 2] [N/A:N/A] Exudate Amount: [8:Small] [N/A:N/A] Exudate Type: [8:Serous] [N/A:N/A] Exudate Color: [8:amber] [N/A:N/A] Wound Margin: [8:Flat and Intact] [N/A:N/A] Granulation Amount: [8:Large (67-100%)] [N/A:N/A] Granulation Quality: [8:Red, Pink] [N/A:N/A] Necrotic Amount: [8:Small (1-33%)] [N/A:N/A] Exposed Structures: [8:Fat Layer (Subcutaneous Tissue) Exposed: Yes Fascia: No Tendon: No Muscle: No Joint: No Bone: No] [N/A:N/A] Epithelialization: Medium (34-66%) N/A N/A Periwound Skin Texture: Scarring: Yes N/A N/A Excoriation: No Induration: No Callus: No Crepitus: No Rash: No Periwound Skin Moisture: Maceration: No N/A N/A Dry/Scaly: No Periwound Skin Color: Hemosiderin Staining: Yes N/A N/A Atrophie Blanche: No Cyanosis: No Ecchymosis: No Erythema: No Mottled: No Pallor: No Rubor: No Temperature: No Abnormality N/A N/A Tenderness on Palpation: Yes N/A N/A Wound Preparation: Ulcer Cleansing: N/A N/A Rinsed/Irrigated with Saline, Other: soap and water Topical Anesthetic Applied: None Treatment Notes Electronic Signature(s) Signed: 05/09/2018 1:47:23 PM By: Montey Hora Entered By: Montey Hora on 05/09/2018 08:33:20 Portela, [name] ([id number]) -------------------------------------------------------------------------------- Multi-Disciplinary Care Plan Details Patient Name:  [name] Date of Service: 05/09/2018 8:15 AM Medical Record Number: [id number] Patient Account Number: [id number] Date of Birth/Sex: 1951-05-11 (67 y.o. M) Treating RN: Montey Hora Primary Care Yuko Coventry: Royetta Crochet Other Clinician: Referring Vyncent Overby: Royetta Crochet Treating Adira Limburg/Extender: Melburn Hake, HOYT Weeks in Treatment: 28 Active Inactive Abuse / Safety / Falls / Self Care Management Nursing Diagnoses: Potential for falls Goals: Patient will not experience any injury related to falls Date Initiated: 10/25/2017 Target Resolution Date: 02/18/2018 Goal Status: Active Interventions: Assess Activities of Daily Living upon admission and as needed Assess fall risk on admission and as needed Assess: immobility, friction, shearing, incontinence upon admission and as needed Assess impairment of mobility on admission and as needed per policy Assess personal safety and home safety (as indicated) on admission and as needed Assess self care needs on admission and as needed Notes: Nutrition Nursing Diagnoses: Imbalanced nutrition Impaired glucose control: actual or potential Potential for alteratiion in Nutrition/Potential for imbalanced nutrition Goals: Patient/caregiver agrees to and verbalizes understanding of need to use nutritional supplements and/or vitamins as prescribed Date Initiated: 10/25/2017 Target Resolution Date: 02/18/2018 Goal Status: Active Patient/caregiver will maintain therapeutic glucose control Date Initiated: 10/25/2017 Target Resolution Date: 02/18/2018 Goal Status: Active Interventions: Assess patient nutrition upon admission and as needed per policy Provide education on elevated blood sugars and impact on wound healing Provide education on nutrition SANCHEZ, HEMMER ([id number]) Notes: Orientation to the Wound  Care Program Nursing Diagnoses: Knowledge deficit related to the wound healing center program Goals: Patient/caregiver will  verbalize understanding of the [facility] Date Initiated: 10/25/2017 Target Resolution Date: 11/19/2017 Goal Status: Active Interventions: Provide education on orientation to the wound center Notes: Wound/Skin Impairment Nursing Diagnoses: Impaired tissue integrity Knowledge deficit related to ulceration/compromised skin integrity Goals: Ulcer/skin breakdown will have a volume reduction of 80% by week 12 Date Initiated: 10/25/2017 Target Resolution Date: 02/11/2018 Goal Status: Active Interventions: Assess patient/caregiver ability to perform ulcer/skin care regimen upon admission and as needed Assess ulceration(s) every visit Notes: Electronic Signature(s) Signed: 05/09/2018 1:47:23 PM By: Montey Hora Entered By: Montey Hora on 05/09/2018 08:32:52 Mccay, [name] ([id number]) -------------------------------------------------------------------------------- Pain Assessment Details Patient Name: [name] Date of Service: 05/09/2018 8:15 AM Medical Record Number: [id number] Patient Account Number: [id number] Date of Birth/Sex: 1950/06/09 (67 y.o. M) Treating RN: Cornell Barman Primary Care Taylah Dubiel: Royetta Crochet Other Clinician: Referring Hira Trent: Royetta Crochet Treating Christan Ciccarelli/Extender: Melburn Hake, HOYT Weeks in Treatment: 28 Active Problems Location of Pain Severity and Description of Pain Patient Has Paino Yes Site Locations Rate the pain. Current Pain Level: 8 Character of Pain Describe the Pain: Burning, Sharp Pain Management and Medication Current Pain Management: Electronic Signature(s) Signed: 05/09/2018 1:27:23 PM By: Gretta Cool, [name], RN, CWS, [name], [name] Entered By: Gretta Cool, [name], RN, CWS, Kim on 05/09/2018 08:16:48 [name] ([id number]) -------------------------------------------------------------------------------- Patient/Caregiver Education Details Patient Name: [name] Date of Service: 05/09/2018 8:15  AM Medical Record Number: [id number] Patient Account Number: [id number] Date of Birth/Gender: May 30, 1950 (67 y.o. M) Treating RN: Montey Hora Primary Care Physician: Royetta Crochet Other Clinician: Referring Physician: Royetta Crochet Treating Physician/Extender: Sharalyn Ink in Treatment: 28 Education Assessment Education Provided To: Patient Education Topics Provided Venous: Handouts: Other: leg elevation Methods: Explain/Verbal Responses: State content correctly Electronic Signature(s) Signed: 05/09/2018 1:37:43 PM By: Harold Barban Entered By: Harold Barban on 05/09/2018 09:01:47 Olenik, [name] ([id number]) -------------------------------------------------------------------------------- Wound Assessment Details Patient Name: [name] Date of Service: 05/09/2018 8:15 AM Medical Record Number: [id number] Patient Account Number: [id number] Date of Birth/Sex: 1950-06-28 (67 y.o. M) Treating RN: Cornell Barman Primary Care Maaliyah Adolph: Royetta Crochet Other Clinician: Referring Keshara Kiger: Royetta Crochet Treating Ronaldo Crilly/Extender: STONE III, HOYT Weeks in Treatment: 28 Wound Status Wound Number: 8 Primary Diabetic Wound/Ulcer of the Lower Extremity Etiology: Wound Location: Right Lower Leg - Medial, Proximal Secondary Lymphedema Wounding Event: Gradually Appeared Etiology: Date Acquired: 10/17/2017 Wound Open Weeks Of Treatment: 28 Status: Clustered Wound: No Comorbid Chronic Obstructive Pulmonary Disease History: (COPD), Arrhythmia, Deep Vein Thrombosis, Hypertension, Type II Diabetes, Gout, Neuropathy Photos Photo Uploaded By: Gretta Cool, [name], RN, CWS, Kim on 05/09/2018 11:47:57 Wound Measurements Length: (cm) 0.7 Width: (cm) 0.9 Depth: (cm) 0.2 Area: (cm) 0.495 Volume: (cm) 0.099 % Reduction in Area: -200% % Reduction in Volume: -518.7% Epithelialization: Medium (34-66%) Tunneling: No Undermining: No Wound Description Classification: Grade  2 Wound Margin: Flat and Intact Exudate Amount: Small Exudate Type: Serous Exudate Color: amber Foul Odor After Cleansing: No Slough/Fibrino Yes Wound Bed Granulation Amount: Large (67-100%) Exposed Structure Granulation Quality: Red, Pink Fascia Exposed: No Necrotic Amount: Small (1-33%) Fat Layer (Subcutaneous Tissue) Exposed: Yes Necrotic Quality: Adherent Slough Tendon Exposed: No Muscle Exposed: No Joint Exposed: No [name], Mark ([id number]) Bone Exposed: No Periwound Skin Texture Texture Color No Abnormalities Noted: No No Abnormalities Noted: No Callus: No Atrophie Blanche: No Crepitus: No Cyanosis: No Excoriation: No Ecchymosis: No Induration: No Erythema: No Rash: No Hemosiderin Staining: Yes Scarring: Yes Mottled: No Pallor: No  Moisture Rubor: No No Abnormalities Noted: No Dry / Scaly: No Temperature / Pain Maceration: No Temperature: No Abnormality Tenderness on Palpation: Yes Wound Preparation Ulcer Cleansing: Rinsed/Irrigated with Saline, Other: soap and water, Topical Anesthetic Applied: None Treatment Notes Wound #8 (Right, Proximal, Medial Lower Leg) Notes hydrogel, and unna to anchor and 3 layer wrap Electronic Signature(s) Signed: 05/09/2018 1:27:23 PM By: Gretta Cool, [name], RN, CWS, [name], [name] Entered By: Gretta Cool, [name], RN, CWS, Kim on 05/09/2018 08:27:23 Pressley, [name] ([id number]) -------------------------------------------------------------------------------- [place] Details Patient Name: [name] Date of Service: 05/09/2018 8:15 AM Medical Record Number: [id number] Patient Account Number: [id number] Date of Birth/Sex: 10/02/1950 (67 y.o. M) Treating RN: Cornell Barman Primary Care Kelly Ranieri: Royetta Crochet Other Clinician: Referring Tejas Seawood: Royetta Crochet Treating Abrahm Mancia/Extender: Melburn Hake, HOYT Weeks in Treatment: 28 Vital Signs Time Taken: 08:16 Temperature (F): 97.6 Height (in): 74 Pulse (bpm): 60 Weight  (lbs): 220 Respiratory Rate (breaths/min): 18 Body Mass Index (BMI): 28.2 Blood Pressure (mmHg): 126/66 Reference Range: 80 - 120 mg / dl Electronic Signature(s) Signed: 05/09/2018 1:27:23 PM By: Gretta Cool, [name], RN, CWS, [name], [name] Entered By: Gretta Cool, [name], RN, CWS, Kim on 05/09/2018 08:21:36

## 2018-05-23 ENCOUNTER — Encounter: Payer: Medicare Other | Attending: Physician Assistant | Admitting: Physician Assistant

## 2018-05-23 DIAGNOSIS — I4891 Unspecified atrial fibrillation: Secondary | ICD-10-CM | POA: Diagnosis not present

## 2018-05-23 DIAGNOSIS — I10 Essential (primary) hypertension: Secondary | ICD-10-CM | POA: Insufficient documentation

## 2018-05-23 DIAGNOSIS — Z86718 Personal history of other venous thrombosis and embolism: Secondary | ICD-10-CM | POA: Insufficient documentation

## 2018-05-23 DIAGNOSIS — Z7901 Long term (current) use of anticoagulants: Secondary | ICD-10-CM | POA: Insufficient documentation

## 2018-05-23 DIAGNOSIS — I872 Venous insufficiency (chronic) (peripheral): Secondary | ICD-10-CM | POA: Insufficient documentation

## 2018-05-23 DIAGNOSIS — F17210 Nicotine dependence, cigarettes, uncomplicated: Secondary | ICD-10-CM | POA: Diagnosis not present

## 2018-05-23 DIAGNOSIS — E1151 Type 2 diabetes mellitus with diabetic peripheral angiopathy without gangrene: Secondary | ICD-10-CM | POA: Diagnosis not present

## 2018-05-23 DIAGNOSIS — Z86711 Personal history of pulmonary embolism: Secondary | ICD-10-CM | POA: Insufficient documentation

## 2018-05-23 DIAGNOSIS — L97812 Non-pressure chronic ulcer of other part of right lower leg with fat layer exposed: Secondary | ICD-10-CM | POA: Diagnosis present

## 2018-05-23 DIAGNOSIS — E1140 Type 2 diabetes mellitus with diabetic neuropathy, unspecified: Secondary | ICD-10-CM | POA: Diagnosis not present

## 2018-05-23 DIAGNOSIS — M109 Gout, unspecified: Secondary | ICD-10-CM | POA: Diagnosis not present

## 2018-05-23 DIAGNOSIS — Z8673 Personal history of transient ischemic attack (TIA), and cerebral infarction without residual deficits: Secondary | ICD-10-CM | POA: Diagnosis not present

## 2018-05-23 DIAGNOSIS — E11622 Type 2 diabetes mellitus with other skin ulcer: Secondary | ICD-10-CM | POA: Insufficient documentation

## 2018-05-23 DIAGNOSIS — J449 Chronic obstructive pulmonary disease, unspecified: Secondary | ICD-10-CM | POA: Diagnosis not present

## 2018-05-24 NOTE — Progress Notes (Signed)
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 with better-looking epithelialization today 09/14/16; the patient arrives today with an odor to his wound and some greenish necrotic surface over the wound approximately 70%. He had a small satellite lesion noted last week when we changed his dressing in  between application of TheraSkin. I elected not to put that TheraSkin on today. 09/21/16; deterioration in the wound last week. I gave him empiric Cefdinir out of fear for a gram-negative infection although the CULTURE turned out to be negative. He completed his antibiotics this morning. Wound looks somewhat better, I put silver alginate on it last week again out of concern for infection. We do not have a TheraSkin this week not ordered last week 09/28/16; no major change from last week. We've looked over the volume of this wound and of not had major changes in spite of TheraSkin although the last TheraSkin was almost a month ago. We put silver alginate on last week out of fear of infection. I will switch to [facility] as looking over the records didn't really suggest that theraskin had helped 10/12/16; we'll use Hydrofera Blue starting last week. No major change in the wound dimensions. 10/19/16; continue Hydrofera Blue. 1.8 x 2.5 x 0.2. Wound base looks healthy 10/26/16; continue with Hydrofera Blue. 1.5 x 2.4 x 0.2 11/02/16 no change in dimensions. Change from [facility] to Iodoflex 11/09/16; patient complains of increasing pain. Dimensions slightly larger. Last week I put Iodoflex on this wound to see if we can get a better surface I also increased him to 4 layer compression. He has not been systemically unwell. 11/16/16; patient states his leg feels better. He has completed his antibiotics. Dimensions are better. I change back to [facility] last week. We also change the dressing once on Friday which may have helped. Wound looks a lot better today than last week.. 2.2 x 2.4 x 0.3 11/23/16 on evaluation today patient's right lower extremity ulcer appears to be doing very well. He has been tolerating the [facility] Dressing and tells me that fortunately he is finally improvement. He is pleased with how this is progressing. PIERRE, DELLAROCCO ([id number]) 11/30/16; improved using Hydrofera  Blue 12/07/16; continue dramatic progress. Wound is now small and healthy looking. Continue use of Hydrofera Blue 12/14/16; very small wound albeit with some depth. Continued use of Hydrofera blue. 12/21/16 on evaluation today patient's right lower extremity ulcer appears healed at this point. He is having no discomfort and overall this is doing very well. And there's no sign of infection. 04/26/17; READMISSION Patient we know from previous stays in this clinic. The patient has known diabetic PAD and has a stent in the right leg. 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 When he was last in the clinic in August we had healed him out for what was felt to be a mostly venous insufficiency wound on the medial right leg. He follows with vascular surgery Dr. dew is at [place] and vascular. He has previously undergone successful laser ablation. Reflux  studies on 4/24 showed reflux in the common femoral, femoral and popliteal. He underwent successful ablation of the right greater saphenous vein from the distal thigh to the mid calf level. Successful ablation of the right small saphenous vein. He had unsuccessful ablation of the right greater saphenous vein from the saphenofemoral junction to the distal 5 level. The patient wears to let her compression stockings and he has been compliant with this With regards to his diabetes he is not currently on any treatment. He does describe pain in his leg which forces him to stop although I couldn't really quantify this. His last arterial studies were on 07/05/16 which showed triphasic waveforms on the right to the level of the popliteal artery. The right posterior tibial and anterior tibial artery were not visualized on this study. I don't actually see ABIs or TBIs. 05/04/17; the patient has small wounds on the right medial heel and the right medial leg. The area on the foot is just about closed. The area on the right medial leg is  improved. He is tolerating the 3 layer compression without any complaints. The patient has both known PAD and chronic venous reflux [see discussion above]. 05/11/17; the areas on the patient's medial right heel with healed he still has an open area on the right medial leg. This does not require debridement today I think I did read it last week. We are using silver collagen under 3 layer compression. 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 He has been tolerating the dressing changes without complication. With that being said he does have some epithelialization growing into the wound bed and it appears this wound may be trying to healed in a depressed fashion. To be honest that is not the worst case scenario and I think this may definitely do fine even if it heals in that way. There was no requirement for debridement today as patient appears to be doing very well in regard to his ulcer. His swelling was a little bit more severe than last week but nothing too significant. 07/05/17 on evaluation today patient appears to be doing rather well in regard to his right medial lower extremity ulcer. The wound does appear to be healing although it is healing and somewhat of a cratered fashion the good news is he has excellent epithelium noted. Fortunately there does not appear to be any evidence of infection at this point he is tolerating the dressings as well is the wrap well. 07/12/17 on evaluation today patient appears to be doing very well in regard to his left medial lower extremity ulcer. He has been tolerating the dressing changes without complication. In fact compared to last week this wound which was somewhat concave has fielding dramatically and looks to be doing excellent. I'm extremely happy with the progress. He still has  pain rated to be a 4-5/10 but the wound looks great. 07/20/17; the patient's right medial lower extremity ulcer is closed. He has good edema control. Unfortunately he does not really have adequate compression stockings. 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 He has been treated with compression stockings for some time now in fact we have notes having treated him even as far back as 2016 here in our clinic. Fortunately he has excellent blood flow and tends to heal well. Unfortunately despite wearing his compression stockings, keeping his legs elevated, being on fluid pills (Lasix) and doing everything he can to try to help with fluid he still continues to have issues with this reopening. I think that he may be a good candidate for compression/lymphedema pumps. We have never attempted to get these for him previously. No fevers, chills, nausea, or vomiting noted at this time. Currently patient states his ulcer does hurt although not as bad as some in the past it's also not as large as it has been in the past when it reopened he talked is somewhat early. Obviously this is good news. LONI, ABDON ([id number]) 12/13/17 on evaluation today patient appears to actually be doing very well in regard to his right medial ankle ulcer compared to last week's evaluation. I think having him on the correct antibiotics has made all the difference in the world. With that being said he's not having discomfort like he was previous and in general I feel like he has made excellent strides towards healing in the past week. No fevers, chills, nausea, or vomiting noted at this time. 01/10/18 on  evaluation today patient appears to be doing a little worse compared to last week's evaluation. I think that though the Iodoflex has help with cleaning up the surface of the wound subsequently he has having a lot of drainage which I think is causing some issues in that regard. At this point I think we may want to switch the dressings. 01/17/18 on evaluation today patient appears to be doing rather well in regard to his right medial malleolus ulcer. The good news is I do feel like he showing some signs of improvement which is excellent. I feel like that the [facility] Dressing been better for him than the Iodoflex that we will previously utilizing. He is having much less in the way of discomfort which is good news. Overall very pleased in that regard. Nonetheless the appearance of the wound is improved today as well and that is excellent news. He still has pain but not seemingly as much as previous and I don't feel like there's much maceration either. 01/23/18 on evaluation today patient appears in our clinic a day early for his appointment due to the fact that he was having discomfort and issues with his wrap. Upon inspection of the wound actually appears to be doing fairly well which is good news. With that being said he is having a lot of discomfort at this point unfortunately. I think that this is mainly due to the fact that he is having such drainage as the wound bed itself seems to be doing well. I do believe the [facility] Dressing well is helpful for him which is good news. Nonetheless again I think he may need more frequent dressing changes. 01/31/18 on evaluation today patient actually appears to be doing very well in regard to his right medial ankle ulcer. This is significantly better than what was even noted last week I do think that the extra dressing change with the nurse visit toward the end of the week made a significant improvement for him. Overall I'm very pleased with how things  appear today. 02/07/18 on evaluation today patient actually appears to be doing a  little better in regard to his wound. The measurements are smaller today compared to previous. Fortunately there's no evidence of any infection and I do believe the moisture is much better control with more frequent wrap changes. Overall the patient seems to be making good progress. 02/14/18 on evaluation today patient actually appears to be doing very well in regard to his medial ankle ulcer of the right lower extremity. With that being said he saw slough buildup and this is still been somewhat slow to heal. For that reason my suggestion currently is gonna be that we may need to see about checking on a skin substitute possibly Apligraf to see if this will be of benefit for him. In the past he has benefited from Apligraf. Overall there does not appear to be any evidence of infection at this time which is definitely good news. 02/28/18 on evaluation today patient's medial ankle ulcer on the right actually appears to be doing significantly better compared to previous. He has been tolerating the dressing changes without complication. Fortunately there appears to be no evidence of infection which is great news. Overall I do feel like that he has made improvement although there has been some delay in general and the wound is taken a little longer than I would've liked up to this point. Nonetheless there's no sign of infection and therefore I think that we can proceed with the Apligraf applications we actually got this approved for him. That something that I'm gonna see about ordering for next week. 03/07/18 on evaluation today patient unfortunately appears to be doing significantly worse than normal as far as his overall and general appearance is concerned. He has been tolerating the dressing changes without complication. With that being said nothing appears to be problematic in regard to his wound. The issue that I see right  now involves more his general malaise and the fact that he does appear to have atrial fibrillation which is a known issue but unfortunately he is dropping low to the point of having 3-5 seconds of sustained pulses that are around 30 bpm. This was confirmed actually during evaluation today by myself. He's been having left lower quadrant pain and was actually in the emergency department on 17 October most recently. According to the notes reviewed it did not appear that anything was identified as far as a cause of the reason for this. It does not appear to the wound related which is good news. Nonetheless still I'm unsure of exactly what is going on but I am worried on top of everything else and the patient feeling lethargic he showed up late for his appointment today which is very uncharacteristic for this gentleman. 03/15/18; the patient was sent to the ER last week during his evaluation but he was not admitted. I have not reviewed the emergency room record. He states he feels better. He is here for application of Apligraf #1. The Apligraf we had would've expired tomorrow so he was brought in early 03/28/18 on evaluation today patient actually appears to be doing well in my pinion in regard to the overall appearance of his right lower Trinity ulcer. He has been tolerating the dressing changes without complication. Fortunately there does not appear to be any evidence of infection at this time which is also good news. He does seem to have tolerated the first Apligraf Felter, Ladislaus ([id number]) application. He is here for number two today. 04/11/18 upon evaluation today patient actually appears to be doing very well in regard to  his right medial malleolus ulcer. He has been tolerating the dressing changes specifically the compression wrap without complication. The Apligraf does seem to be beneficial for him he showing signs of good improvement which is excellent news. Overall I'm very pleased even  though the measurements have not dramatically improve the appearance of the wound has. 04/25/18 on evaluation today patient actually appears to be doing much better in regard to his right medial ankle ulcer. He has been tolerating the dressing changes without complication specifically the Apligraf and the compression wrap. At this point I think he has made great progress even since last week. Fortunately there does not appear to be any evidence of infection at this time. 05/09/18 on evaluation today patient appears to be doing very well in regard to his lower Trinity ulcer. In fact this has made great progress since last time I seen him there's just one small area remaining this is at the top where it was worse. Fortunately there is no sign of infection and I do believe he's making great progress. This is his last application of the Apligraf today. 05/23/18 on evaluation today patient's wound actually appears to be showing signs of good improvement. He has completed his series of Apligraf at this point. All in all I do believe this was of great benefit for him and were very close to seeing this completely resolved. Patient History Information obtained from Patient. Family History Heart Disease - Mother,Father, Hypertension - Mother,Father, Stroke - Father, No family history of Cancer, Diabetes, Hereditary Spherocytosis, Thyroid Problems, Tuberculosis. Social History Current every day smoker, Marital Status - Single, Alcohol Use - Never, Drug Use - No History, Caffeine Use - Daily. Medical History Hospitalization/Surgery History - 05/17/2012, [facility], CVA. - 03/17/2017, [facility], Pneumonia. Medical And Surgical History Notes Ear/Nose/Mouth/Throat difficulty speaking Respiratory Hospitalized for Pneumonia 02/2017 Neurologic CVA in 2014 Review of Systems (ROS) Constitutional Symptoms (General Health) Denies complaints or symptoms of Fever, Chills. Respiratory The patient has no complaints or  symptoms. Cardiovascular Complains or has symptoms of LE edema. Psychiatric The patient has no complaints or symptoms. Osorto, [name] ([id number]) Objective Constitutional Well-nourished and well-hydrated in no acute distress. Vitals Time Taken: 8:17 AM, Height: 74 in, Weight: 220 lbs, BMI: 28.2, Temperature: 98.0 F, Pulse: 49 bpm, Respiratory Rate: 18 breaths/min, Blood Pressure: 136/72 mmHg. Respiratory normal breathing without difficulty. Psychiatric this patient is able to make decisions and demonstrates good insight into disease process. Alert and Oriented x 3. pleasant and cooperative. General Notes: Patient's wound bed currently shows evidence of good granulation at this time there was some minimal Slough noted on the surface of the wound which did require sharp debridement at this point. Post debridement when bed appears to be doing much better. Integumentary (Hair, Skin) Wound #8 status is Open. Original cause of wound was Gradually Appeared. The wound is located on the Right,Proximal,Medial Lower Leg. The wound measures 1cm length x 1.3cm width x 0.1cm depth; 1.021cm^2 area and 0.102cm^3 volume. There is Fat Layer (Subcutaneous Tissue) Exposed exposed. There is no tunneling or undermining noted. There is a small amount of serous drainage noted. The wound margin is flat and intact. There is large (67-100%) red, pink granulation within the wound bed. There is a small (1-33%) amount of necrotic tissue within the wound bed including Adherent Slough. The periwound skin appearance exhibited: Scarring, Hemosiderin Staining. The periwound skin appearance did not exhibit: Callus, Crepitus, Excoriation, Induration, Rash, Dry/Scaly, Maceration, Atrophie Blanche, Cyanosis, Ecchymosis, Mottled, Pallor, Rubor, Erythema. Periwound temperature was  noted as No Abnormality. The periwound has tenderness on palpation. 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 There was a Excisional Skin/Subcutaneous Tissue Muckleroy, Donnavin ([id number]) Debridement with a total area of 1.3 sq cm performed by STONE III, Cait Locust E., PA-C. With the following instrument(s): Curette to remove Viable and Non-Viable tissue/material. Material removed includes Subcutaneous Tissue, Slough, Skin: Dermis, Skin: Epidermis, and Fibrin/Exudate after achieving pain control using Lidocaine 4% Topical Solution. No specimens were taken. A time out was conducted at 08:40, prior to the start of the procedure. A Minimum amount of bleeding was controlled with Pressure. The procedure was tolerated well with a pain level of 0 throughout and a pain level of 0 following the procedure. Post Debridement Measurements: 1cm length x 1.3cm width x 0.2cm depth; 0.204cm^3 volume. Character of Wound/Ulcer Post Debridement is improved. Severity of Tissue Post Debridement is: Fat layer exposed. Post procedure Diagnosis Wound #8: Same as Pre-Procedure Plan Wound Cleansing: Wound #8 Right,Proximal,Medial Lower Leg: Clean wound with Normal Saline. Cleanse wound with mild soap and water Anesthetic (add to Medication List): Wound #8 Right,Proximal,Medial Lower Leg: Topical Lidocaine 4% cream applied to wound bed prior to debridement (In Clinic Only). Skin Barriers/Peri-Wound Care: Wound #8 Right,Proximal,Medial Lower Leg: Moisturizing lotion - not on wound area Primary Wound Dressing: Hydrogel Silver Collagen Secondary Dressing: Wound #8 Right,Proximal,Medial Lower Leg: ABD pad Dressing Change Frequency: Wound #8  Right,Proximal,Medial Lower Leg: Change dressing every week Follow-up Appointments: Wound #8 Right,Proximal,Medial Lower Leg: Return Appointment in 1 week. Edema Control: Wound #8 Right,Proximal,Medial Lower Leg: 3 Layer Compression System - Right Lower Extremity - unna to anchor Additional Orders / Instructions: Wound #8 Right,Proximal,Medial Lower Leg: Increase protein intake. My suggestion currently is gonna be that we go ahead and continue with the above wound care measures for the next week. Hopefully the Prisma will be beneficial in helping to get this to completely heal up. He is very close. Please see above for specific wound care orders. We will see patient for re-evaluation in 1 week(s) here in the clinic. If anything worsens or changes patient will contact our office for additional recommendations. Electronic Signature(s) Signed: 05/24/2018 8:35:34 AM By: Irean Hong Garcialopez, [place] ([id number]) Entered By: Worthy Keeler on 05/23/2018 08:47:20 Mandato, [name] ([id number]) -------------------------------------------------------------------------------- ROS/PFSH Details Patient Name: [name] Date of Service: 05/23/2018 8:15 AM Medical Record Number: [id number] Patient Account Number: [id number] Date of Birth/Sex: [birth year] (67 y.o. M) Treating RN: Montey Hora Primary Care Provider: Royetta Crochet Other Clinician: Referring Provider: Royetta Crochet Treating Provider/Extender: Melburn Hake, Emoni [name] Weeks in Treatment: 30 Information Obtained From Patient Wound History Do you currently have one or more open woundso Yes How many open wounds do you currently haveo 1 Approximately how long have you had your woundso 1 week How have you been treating your wound(s) until nowo lotion Has your wound(s) ever healed and then re-openedo Yes Have you had any lab work done in the past montho No Have you tested positive for an antibiotic resistant organism (MRSA,  VRE)o No Have you tested positive for osteomyelitis (bone infection)o No Have you had any tests for circulation on your legso Yes Who  ordered the testo [facility] Where was the test doneo [facility] Constitutional Symptoms (General Health) Complaints and Symptoms: Negative for: Fever; Chills Cardiovascular Complaints and Symptoms: Positive for: LE edema Medical History: Positive for: Arrhythmia - a fib; Deep Vein Thrombosis; Hypertension Negative for: Angina; Congestive Heart Failure; Coronary Artery Disease; Hypotension; Myocardial Infarction; Peripheral Arterial Disease; Peripheral Venous Disease; Phlebitis; Vasculitis Eyes Medical History: Negative for: Cataracts; Glaucoma; Optic Neuritis Ear/Nose/Mouth/Throat Medical History: Negative for: Chronic sinus problems/congestion; Middle ear problems Past Medical History Notes: difficulty speaking Hematologic/Lymphatic Medical History: Negative for: Anemia; Hemophilia; Human Immunodeficiency Virus; Lymphedema; Sickle Cell Disease Respiratory Leech, Lynn ([id number]) Complaints and Symptoms: No Complaints or Symptoms Medical History: Positive for: Chronic Obstructive Pulmonary Disease (COPD) Negative for: Aspiration; Asthma; Pneumothorax; Sleep Apnea; Tuberculosis Past Medical History Notes: Hospitalized for Pneumonia 02/2017 Gastrointestinal Medical History: Negative for: Cirrhosis ; Colitis; Crohnos; Hepatitis A; Hepatitis B; Hepatitis C Endocrine Medical History: Positive for: Type II Diabetes Negative for: Type I Diabetes Time with diabetes: since 2017 Treated with: Diet Blood sugar tested every day: No Genitourinary Medical History: Negative for: End Stage Renal Disease Immunological Medical History: Negative for: Lupus Erythematosus; Raynaudos; Scleroderma Integumentary (Skin) Medical History: Negative for: History of Burn; History of pressure wounds Musculoskeletal Medical History: Positive for:  Gout Negative for: Rheumatoid Arthritis; Osteoarthritis; Osteomyelitis Neurologic Medical History: Positive for: Neuropathy Negative for: Dementia; Quadriplegia; Paraplegia; Seizure Disorder Past Medical History Notes: CVA in 2014 Oncologic Medical History: Negative for: Received Chemotherapy; Received Radiation Psychiatric DONNALD, TABAR ([id number]) Complaints and Symptoms: No Complaints or Symptoms Medical History: Negative for: Anorexia/bulimia; Confinement Anxiety Immunizations Pneumococcal Vaccine: Received Pneumococcal Vaccination: Yes Immunization Notes: up to date Implantable Devices Hospitalization / Surgery History Name of Hospital Purpose of Hospitalization/Surgery Date [place] CVA 05/17/2012 [place] Pneumonia 03/17/2017 Family and Social History Cancer: No; Diabetes: No; Heart Disease: Yes - Mother,Father; Hereditary Spherocytosis: No; Hypertension: Yes - Mother,Father; Stroke: Yes - Father; Thyroid Problems: No; Tuberculosis: No; Current every day smoker; Marital Status - Single; Alcohol Use: Never; Drug Use: No History; Caffeine Use: Daily; Financial Concerns: No; Food, Clothing or Shelter Needs: No; Support System Lacking: No; Transportation Concerns: No; Advanced Directives: No; Patient does not want information on Advanced Directives Physician Affirmation I have reviewed and agree with the above information. Electronic Signature(s) Signed: 05/23/2018 4:56:07 PM By: Montey Hora Signed: 05/24/2018 8:35:34 AM By: Worthy Keeler PA-C Entered By: Worthy Keeler on 05/23/2018 08:46:42 Yamada, [name] ([id number]) -------------------------------------------------------------------------------- SuperBill Details Patient Name: [name] Date of Service: 05/23/2018 Medical Record Number: [id number] Patient Account Number: [id number] Date of Birth/Sex: [birth year] (67 y.o. 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 

## 2018-05-25 ENCOUNTER — Encounter: Payer: Self-pay | Admitting: Podiatry

## 2018-05-25 ENCOUNTER — Ambulatory Visit (INDEPENDENT_AMBULATORY_CARE_PROVIDER_SITE_OTHER): Payer: Medicare Other | Admitting: Podiatry

## 2018-05-25 DIAGNOSIS — M79676 Pain in unspecified toe(s): Secondary | ICD-10-CM

## 2018-05-25 DIAGNOSIS — E1142 Type 2 diabetes mellitus with diabetic polyneuropathy: Secondary | ICD-10-CM | POA: Diagnosis not present

## 2018-05-25 DIAGNOSIS — B351 Tinea unguium: Secondary | ICD-10-CM

## 2018-05-25 NOTE — Progress Notes (Signed)
Complaint:  Visit Type: Patient returns to my office for continued preventative foot care services. Complaint: Patient states" my nails have grown long and thick and become painful to walk and wear shoes" Patient has been diagnosed with DM with neuropathy.. The patient presents for preventative foot care services. No changes to ROS.  Patient is being treated for leg ulcers right leg.  Podiatric Exam: Vascular: dorsalis pedis and posterior tibial pulses are palpable bilateral. Capillary return is immediate. Temperature gradient is WNL. Skin turgor WNL .  Venous stasis leg/foot  B/L. Sensorium: Diminished  Semmes Weinstein monofilament test. Normal tactile sensation bilaterally. Nail Exam: Pt has thick disfigured discolored nails with subungual debris noted bilateral entire nail hallux through fifth toenails Ulcer Exam: There is no evidence of ulcer or pre-ulcerative changes or infection. Orthopedic Exam: Muscle tone and strength are WNL. No limitations in general ROM. No crepitus or effusions noted. Foot type and digits show no abnormalities. DJD midfoot right. Skin: No Porokeratosis. No infection or ulcers  Diagnosis:  Onychomycosis, , Pain in right toe, pain in left toes  Treatment & Plan Procedures and Treatment: Consent by patient was obtained for treatment procedures.   Debridement of mycotic and hypertrophic toenails, 1 through 5 bilateral and clearing of subungual debris. No ulceration, no infection noted.  Return Visit-Office Procedure: Patient instructed to return to the office for a follow up visit 3 months for continued evaluation and treatment.    Gardiner Barefoot DPM

## 2018-05-26 NOTE — Progress Notes (Signed)
JAZION, ATTEBERRY ([id number]) Visit Report for 05/23/2018 Arrival Information Details Patient Name: [name], [name] Date of Service: 05/23/2018 8:15 AM Medical Record Number: [id number] Patient Account Number: [id number] Date of Birth/Sex: 08-12-1950 (67 y.o. M) Treating RN: Montey Hora Primary Care Vollie Aaron: Royetta Crochet Other Clinician: Referring Myeisha Kruser: Royetta Crochet Treating Yisell Sprunger/Extender: Melburn Hake, HOYT Weeks in Treatment: 38 Visit Information History Since Last Visit Added or deleted any medications: No Patient Arrived: Ambulatory Any new allergies or adverse reactions: No Arrival Time: 08:17 Had a fall or experienced change in No Accompanied By: self activities of daily living that may affect Transfer Assistance: None risk of falls: Patient Identification Verified: Yes Signs or symptoms of abuse/neglect since last visito No Secondary Verification Process Yes Hospitalized since last visit: No Completed: Implantable device outside of the clinic excluding No Patient Requires Transmission-Based No cellular tissue based products placed in the center Precautions: since last visit: Patient Has Alerts: Yes Has Dressing in Place as Prescribed: Yes Patient Alerts: Patient on Blood Pain Present Now: Yes Thinner DMII warfarin Electronic Signature(s) Signed: 05/23/2018 3:06:21 PM By: Lorine Bears RCP, RRT, CHT Entered By: Lorine Bears on 05/23/2018 08:17:34 Bortle, [name] ([id number]) -------------------------------------------------------------------------------- Encounter Discharge Information Details Patient Name: [name] Date of Service: 05/23/2018 8:15 AM Medical Record Number: [id number] Patient Account Number: [id number] Date of Birth/Sex: [birth year] (67 y.o. M) Treating RN: Montey Hora Primary Care Demitrius Crass: Royetta Crochet Other Clinician: Referring Rowen Wilmer: Royetta Crochet Treating Terrianna Holsclaw/Extender:  Melburn Hake, HOYT Weeks in Treatment: 30 Encounter Discharge Information Items Post Procedure Vitals Discharge Condition: Stable Temperature (F): 98.0 Ambulatory Status: Ambulatory Pulse (bpm): 49 Discharge Destination: Home Respiratory Rate (breaths/min): 16 Transportation: Private Auto Blood Pressure (mmHg): 136/72 Accompanied By: self Schedule Follow-up Appointment: Yes Clinical Summary of Care: Electronic Signature(s) Signed: 05/23/2018 4:56:07 PM By: Montey Hora Entered By: Montey Hora on 05/23/2018 08:48:36 Rowlette, [name] ([id number]) -------------------------------------------------------------------------------- Lower Extremity Assessment Details Patient Name: [name] Date of Service: 05/23/2018 8:15 AM Medical Record Number: [id number] Patient Account Number: [id number] Date of Birth/Sex: 06-12-1950 (67 y.o. M) Treating RN: Harold Barban Primary Care Allysen Lazo: Royetta Crochet Other Clinician: Referring Jayda White: Royetta Crochet Treating Reha Martinovich/Extender: Melburn Hake, HOYT Weeks in Treatment: 30 Edema Assessment Assessed: [Left: No] [Right: No] E[Left: dema] [Right: :] Calf Left: Right: Point of Measurement: 34 cm From Medial Instep cm 38 cm Ankle Left: Right: Point of Measurement: 12 cm From Medial Instep cm 22 cm Vascular Assessment Pulses: Dorsalis Pedis Palpable: [Right:Yes] Posterior Tibial Palpable: [Right:No] Extremity colors, hair growth, and conditions: Hair Growth on Extremity: [Right:No] Temperature of Extremity: [Right:Warm] Toe Nail Assessment Left: Right: Thick: Yes Discolored: Yes Deformed: Yes Improper Length and Hygiene: No Electronic Signature(s) Signed: 05/25/2018 2:41:13 PM By: Harold Barban Entered By: Harold Barban on 05/23/2018 08:34:18 Holston, [name] ([id number]) -------------------------------------------------------------------------------- Multi Wound Chart Details Patient Name: [name] Date of Service: 05/23/2018 8:15 AM Medical Record Number: [id number] Patient Account Number: [id number] Date of Birth/Sex: [birth year] (67 y.o. 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] [N/A:N/A] Secondary Etiology: [8:Lymphedema] [N/A:N/A] Comorbid History: [8:Chronic Obstructive Pulmonary Disease (COPD), Arrhythmia, Deep Vein Thrombosis, Hypertension, Type II Diabetes, Gout, Neuropathy] [N/A:N/A] Date  Acquired: [8:10/17/2017] [N/A:N/A] Weeks of Treatment: [8:30] [N/A:N/A] Wound Status: [8:Open] [N/A:N/A] Measurements L x W x D [8:1x1.3x0.1] [N/A:N/A] (cm) Area (cm) : [8:1.021] [N/A:N/A] Volume (cm) : [8:0.102] [N/A:N/A] % Reduction in Area: [8:-518.80%] [N/A:N/A] % Reduction in Volume: [8:-537.50%] [N/A:N/A] Classification: [8:Grade 2] [N/A:N/A] Exudate Amount: [8:Small] [N/A:N/A] Exudate Type: [8:Serous] [N/A:N/A] Exudate Color: [8:amber] [N/A:N/A] Wound Margin: [8:Flat and Intact] [N/A:N/A] Granulation Amount: [8:Large (67-100%)] [N/A:N/A] Granulation Quality: [8:Red, Pink] [N/A:N/A] Necrotic Amount: [8:Small (1-33%)] [N/A:N/A] Exposed Structures: [8:Fat Layer (Subcutaneous Tissue) Exposed: Yes Fascia: No Tendon: No Muscle: No Joint: No Bone: No] [N/A:N/A] Epithelialization: Medium (34-66%) N/A N/A Periwound Skin Texture: Scarring: Yes N/A N/A Excoriation: No Induration: No Callus: No Crepitus: No Rash: No Periwound Skin  Moisture: Maceration: No N/A N/A Dry/Scaly: No Periwound Skin Color: Hemosiderin Staining: Yes N/A N/A Atrophie Blanche: No Cyanosis: No Ecchymosis: No Erythema: No Mottled: No Pallor: No Rubor: No Temperature: No Abnormality N/A N/A Tenderness on Palpation: Yes N/A N/A Wound Preparation: Ulcer Cleansing: N/A N/A Rinsed/Irrigated with Saline, Other: soap and water Topical Anesthetic Applied: Other: lidocaine 4% Treatment Notes Electronic Signature(s) Signed: 05/23/2018 4:56:07 PM By: Montey Hora Entered By: Montey Hora on 05/23/2018 08:42:27 Zoss, [name] ([id number]) -------------------------------------------------------------------------------- Multi-Disciplinary Care Plan Details Patient Name: [name] Date of Service: 05/23/2018 8:15 AM Medical Record Number: [id number] Patient Account Number: [id number] Date of Birth/Sex: 15-Jun-1950 (67 y.o. M) Treating RN: Montey Hora Primary Care Lenae Wherley: Royetta Crochet Other Clinician: Referring Domini Vandehei: Royetta Crochet Treating Ramesses Crampton/Extender: Melburn Hake, HOYT Weeks in Treatment: 30 Active Inactive Abuse / Safety / Falls / Self Care Management Nursing Diagnoses: Potential for falls Goals: Patient will not experience any injury related to falls Date Initiated: 10/25/2017 Target Resolution Date: 02/18/2018 Goal Status: Active Interventions: Assess Activities of Daily Living upon admission and as needed Assess fall risk on admission and as needed Assess: immobility, friction, shearing, incontinence upon admission and as needed Assess impairment of mobility on admission and as needed per policy Assess personal safety and home safety (as indicated) on admission and as needed Assess self care needs on admission and as needed Notes: Nutrition Nursing Diagnoses: Imbalanced nutrition Impaired glucose control: actual or potential Potential for alteratiion in Nutrition/Potential for imbalanced  nutrition Goals: Patient/caregiver agrees to and verbalizes understanding of need to use nutritional supplements and/or vitamins as prescribed Date Initiated: 10/25/2017 Target Resolution Date: 02/18/2018 Goal Status: Active Patient/caregiver will maintain therapeutic glucose control Date Initiated: 10/25/2017 Target Resolution Date: 02/18/2018 Goal Status: Active Interventions: Assess patient nutrition upon admission and as needed per policy Provide education on elevated blood sugars and impact on wound healing Provide education on nutrition BALRAJ, BRAYFIELD ([id number]) Notes: Orientation to the Wound Care Program Nursing Diagnoses: Knowledge deficit related to the wound healing center program Goals: Patient/caregiver will verbalize understanding of the [facility] Program Date Initiated: 10/25/2017 Target Resolution Date: 11/19/2017 Goal Status: Active Interventions: Provide education on orientation to the wound center Notes: Wound/Skin Impairment Nursing Diagnoses: Impaired tissue integrity Knowledge deficit related to ulceration/compromised skin integrity Goals: Ulcer/skin breakdown will have a volume reduction of 80% by week 12 Date Initiated: 10/25/2017 Target Resolution Date: 02/11/2018 Goal Status: Active Interventions: Assess patient/caregiver ability to perform ulcer/skin care regimen upon admission and as needed Assess ulceration(s) every visit Notes: Electronic Signature(s) Signed: 05/23/2018 4:56:07 PM By: Montey Hora Entered By: Montey Hora on 05/23/2018 08:41:19 Samples, [name] ([id number]) -------------------------------------------------------------------------------- Pain Assessment Details Patient Name: [name] Date of Service: 05/23/2018 8:15 AM Medical Record Number: [id number] Patient Account Number: [id number] Date of Birth/Sex: 07/03/1950 (  67 y.o. M) Treating RN: Montey Hora Primary Care Stefania Goulart: Royetta Crochet Other  Clinician: Referring Yuval Rubens: Royetta Crochet Treating Jasmon Graffam/Extender: STONE III, HOYT Weeks in Treatment: 30 Active Problems Location of Pain Severity and Description of Pain Patient Has Paino Yes Site Locations Rate the pain. Current Pain Level: 5 Pain Management and Medication Current Pain Management: Electronic Signature(s) Signed: 05/23/2018 3:06:21 PM By: Lorine Bears RCP, RRT, CHT Signed: 05/23/2018 4:56:07 PM By: Montey Hora Entered By: Lorine Bears on 05/23/2018 08:17:44 Tupper, [name] ([id number]) -------------------------------------------------------------------------------- Patient/Caregiver Education Details Patient Name: [name] Date of Service: 05/23/2018 8:15 AM Medical Record Number: [id number] Patient Account Number: [id number] Date of Birth/Gender: 01/20/1951 (67 y.o. M) Treating RN: Montey Hora Primary Care Physician: Royetta Crochet Other Clinician: Referring Physician: Royetta Crochet Treating Physician/Extender: Sharalyn Ink in Treatment: 30 Education Assessment Education Provided To: Patient Education Topics Provided Venous: Handouts: Other: wound care as ordered Methods: Explain/Verbal Responses: State content correctly Electronic Signature(s) Signed: 05/23/2018 4:56:07 PM By: Montey Hora Entered By: Montey Hora on 05/23/2018 08:47:38 Fuquay, [name] ([id number]) -------------------------------------------------------------------------------- Wound Assessment Details Patient Name: [name] Date of Service: 05/23/2018 8:15 AM Medical Record Number: [id number] Patient Account Number: [id number] Date of Birth/Sex: [birth year] (67 y.o. M) Treating RN: Harold Barban Primary Care Carrick Rijos: Royetta Crochet Other Clinician: Referring Darius Fillingim: Royetta Crochet Treating Tanajah Boulter/Extender: STONE III, HOYT Weeks in Treatment: 30 Wound Status Wound Number: 8 Primary Diabetic  Wound/Ulcer of the Lower Extremity Etiology: Wound Location: Right Lower Leg - Medial, Proximal Secondary Lymphedema Wounding Event: Gradually Appeared Etiology: Date Acquired: 10/17/2017 Wound Open Weeks Of Treatment: 30 Status: Clustered Wound: No Comorbid Chronic Obstructive Pulmonary Disease History: (COPD), Arrhythmia, Deep Vein Thrombosis, Hypertension, Type II Diabetes, Gout, Neuropathy Photos Photo Uploaded By: Harold Barban on 05/23/2018 10:07:52 Wound Measurements Length: (cm) 1 Width: (cm) 1.3 Depth: (cm) 0.1 Area: (cm) 1.021 Volume: (cm) 0.102 % Reduction in Area: -518.8% % Reduction in Volume: -537.5% Epithelialization: Medium (34-66%) Tunneling: No Undermining: No Wound Description Classification: Grade 2 Wound Margin: Flat and Intact Exudate Amount: Small Exudate Type: Serous Exudate Color: amber Foul Odor After Cleansing: No Slough/Fibrino Yes Wound Bed Granulation Amount: Large (67-100%) Exposed Structure Granulation Quality: Red, Pink Fascia Exposed: No Necrotic Amount: Small (1-33%) Fat Layer (Subcutaneous Tissue) Exposed: Yes Necrotic Quality: Adherent Slough Tendon Exposed: No Muscle Exposed: No Joint Exposed: No Tidwell, Davied ([id number]) Bone Exposed: No Periwound Skin Texture Texture Color No Abnormalities Noted: No No Abnormalities Noted: No Callus: No Atrophie Blanche: No Crepitus: No Cyanosis: No Excoriation: No Ecchymosis: No Induration: No Erythema: No Rash: No Hemosiderin Staining: Yes Scarring: Yes Mottled: No Pallor: No Moisture Rubor: No No Abnormalities Noted: No Dry / Scaly: No Temperature / Pain Maceration: No Temperature: No Abnormality Tenderness on Palpation: Yes Wound Preparation Ulcer Cleansing: Rinsed/Irrigated with Saline, Other: soap and water, Topical Anesthetic Applied: Other: lidocaine 4%, Treatment Notes Wound #8 (Right, Proximal, Medial Lower Leg) Notes hydrogel, prisma, abd and  unna to anchor and 3 layer wrap Electronic Signature(s) Signed: 05/25/2018 2:41:13 PM By: Harold Barban Entered By: Harold Barban on 05/23/2018 08:32:43 Guidry, [name] ([id number]) -------------------------------------------------------------------------------- Vitals Details Patient Name: [name] Date of Service: 05/23/2018 8:15 AM Medical Record Number: [id number] Patient Account Number: [id number] Date of Birth/Sex: [birth year] (68 y.o. M) Treating RN: Montey Hora Primary Care Myan Locatelli: Royetta Crochet Other Clinician: Referring Simar Pothier: Royetta Crochet Treating Ayushi Pla/Extender: STONE III, HOYT Weeks in Treatment: 30 Vital Signs Time Taken: 08:17 Temperature (F): 98.0 Height (in): 74 Pulse (bpm): 49 Weight (lbs): 220 Respiratory  Rate (breaths/min): 18 Body Mass Index (BMI): 28.2 Blood Pressure (mmHg): 136/72 Reference Range: 80 - 120 mg / dl Electronic Signature(s) Signed: 05/23/2018 3:06:21 PM By: Lorine Bears RCP, RRT, CHT Entered By: Becky Sax, Amado Nash on 05/23/2018 08:21:13

## 2018-05-30 ENCOUNTER — Encounter: Payer: Medicare Other | Admitting: Physician Assistant

## 2018-05-30 DIAGNOSIS — E11622 Type 2 diabetes mellitus with other skin ulcer: Secondary | ICD-10-CM | POA: Diagnosis not present

## 2018-05-31 NOTE — Progress Notes (Signed)
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 08/04/16 no major change in wound dimensions although the base of this looks fairly healthy. No debridement TheraSkin #2 08/17/16- the patient [name] here for follow-up of a attenuation of his right lower Schmeltzer. He [name] status post 2 TheraSkin applications and he states he has an appointment for venous ablation with Dr.Dew on 4/13. 08/31/16; the patient had laser ablation by Dr. dew on 4/13. I think this involved both the greater and lesser saphenous veins. He  tolerated this well. We have been putting TheraSkin on the wound every 2 weeks and he arrives with better-looking epithelialization today 09/14/16; the patient arrives today with an odor to his wound and some greenish necrotic surface over the wound approximately 70%. He had a small satellite lesion noted last week when we changed his dressing in between application of TheraSkin. I elected not to put that TheraSkin on today. 09/21/16; deterioration in the wound last week. I gave him empiric Cefdinir out of fear for a gram-negative infection although the CULTURE turned out to be negative. He completed his antibiotics this morning. Wound looks somewhat better, I put silver alginate on it last week again out of concern for infection. We do not have a TheraSkin this week not ordered last week 09/28/16; no major change from last week. We've looked over the volume of this wound and of not had major changes in spite of TheraSkin although the last TheraSkin was almost a month ago. We put silver alginate on last week out of fear of infection. I will switch to [facility] as looking over the records didn't really suggest that theraskin had helped 10/12/16; we'll use Hydrofera Blue starting last week. No major change in the wound dimensions. 10/19/16; continue Hydrofera Blue. 1.8 x 2.5 x 0.2. Wound base looks healthy 10/26/16; continue with Hydrofera Blue. 1.5 x 2.4 x 0.2 11/02/16 no change in dimensions. Change from [facility] to Iodoflex 11/09/16; patient complains of increasing pain. Dimensions slightly larger. Last week I put Iodoflex on this wound to see if we can get a better surface I also increased him to 4 layer compression. He has not been systemically unwell. 11/16/16; patient states his leg feels better. He has completed his antibiotics. Dimensions are better. I change back to [facility] last week. We also change the dressing once on Friday which may have helped. Wound looks a lot better today than  last week.. 2.2 x 2.4 x 0.3 11/23/16 on evaluation today patient's right lower extremity ulcer appears to be doing very well. He has been tolerating the [facility] Dressing and tells me that fortunately he [name] finally improvement. He [name] pleased with how this [name] progressing. VIDYUTH, BELSITO ([id number]) 11/30/16; improved using Hydrofera Blue 12/07/16; continue dramatic progress. Wound [name] now small and healthy looking. Continue use of Hydrofera Blue 12/14/16; very small wound albeit with some depth. Continued use of Hydrofera blue. 12/21/16 on evaluation today patient's right lower extremity ulcer appears healed at this point. He [name] having no discomfort and overall this [name] doing very well. And there's no sign of infection. 04/26/17; READMISSION Patient we know from previous stays in this clinic. The patient has known diabetic PAD and has a stent in the right leg. 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 When he was last in the clinic in August we had healed him out for what was felt to be a mostly venous insufficiency wound on the medial right leg. He follows with vascular surgery Dr. dew [name] at [place] [facility]. He has previously undergone successful laser ablation. Reflux studies on 4/24 showed reflux in the common femoral, femoral and popliteal. He underwent successful ablation of the right greater saphenous vein from the distal thigh to the mid calf level. Successful ablation of the right small saphenous vein. He had unsuccessful ablation of the right greater saphenous vein from the saphenofemoral junction to the distal 5 level. The patient wears to let her compression stockings and he has been compliant with this With regards to his diabetes he [name] not currently on any treatment. He does describe pain in his leg which forces him to stop although I couldn't really quantify this. His last arterial studies were on 07/05/16 which showed triphasic waveforms  on the right to the level of the popliteal artery. The right posterior tibial and anterior tibial artery were not visualized on this study. I don't actually see ABIs Mark TBIs. 05/04/17; the patient has small wounds on the right medial heel and the right medial leg. The area on the foot [name] just about closed. The area on the right medial leg [name] improved. He [name] tolerating the 3 layer compression without any complaints. The patient has both known PAD and chronic venous reflux [see discussion above]. 05/11/17; the areas on the patient's medial right heel with healed he still has an open area on the right medial leg. This does not require debridement today I think I did read it last week. We are using silver collagen under 3 layer compression. 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 He has been tolerating the dressing changes without complication. With that being said he does have some epithelialization growing into the wound bed and it appears this wound may be trying to healed in a depressed fashion. To be honest that [name] not the worst case scenario and I think this may definitely do fine even if it heals in that way. There was no requirement for debridement today as patient appears to be doing very well in regard to his ulcer. His swelling was a little bit more severe than last week but nothing too significant. 07/05/17 on evaluation today patient appears to be doing rather well in regard to his right medial lower extremity ulcer. The wound does appear to be healing although it [name] healing and somewhat of a cratered fashion the good news [name] he has excellent epithelium noted. Fortunately there does not appear to be any evidence of infection at this point he [name] tolerating the dressings as well [name] the wrap well. 07/12/17 on evaluation  today patient appears to be doing very well in regard to his left medial lower extremity ulcer. He has been tolerating the dressing changes without complication. In fact compared to last week this wound which was somewhat concave has fielding dramatically and looks to be doing excellent. I'm extremely happy with the progress. He still has pain rated to be a 4-5/10 but the wound looks great. 07/20/17; the patient's right medial lower extremity ulcer [name] closed. He has good edema control. Unfortunately he does not really have adequate compression stockings. Certainly what he  brought in the blood on his right leg might of been support hose however there [name] whole in the stocking already Readmission: 10/25/17 patient seen today for reevaluation due to a recurrence of the right medial ankle venous leg ulcer. 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 He has been treated with compression stockings for some time now in fact we have notes having treated him even as far back as 2016 here in our clinic. Fortunately he has excellent blood flow and tends to heal well. Unfortunately despite wearing his compression stockings, keeping his legs elevated, being on fluid pills (Lasix) and doing everything he can to try to help with fluid he still continues to have issues with this reopening. I think that he may be a good candidate for compression/lymphedema pumps. We have never attempted to get these for him previously. No fevers, chills, nausea, Mark vomiting noted at this time. Currently patient states his ulcer does hurt although not as bad as some in the past it's also not as large as it has been in the past when it reopened he talked [name] somewhat early. Obviously this [name] good news. AROLDO, GALLI ([id number]) 12/13/17 on evaluation today patient appears to actually be doing very well in regard to his right medial ankle ulcer compared to last  week's evaluation. I think having him on the correct antibiotics has made all the difference in the world. With that being said he's not having discomfort like he was previous and in general I feel like he has made excellent strides towards healing in the past week. No fevers, chills, nausea, Mark vomiting noted at this time. 01/10/18 on evaluation today patient appears to be doing a little worse compared to last week's evaluation. I think that though the Iodoflex has help with cleaning up the surface of the wound subsequently he has having a lot of drainage which I think [name] causing some issues in that regard. At this point I think we may want to switch the dressings. 01/17/18 on evaluation today patient appears to be doing rather well in regard to his right medial malleolus ulcer. The good news [name] I do feel like he showing some signs of improvement which [name] excellent. I feel like that the [facility] Dressing been better for him than the Iodoflex that we will previously utilizing. He [name] having much less in the way of discomfort which [name] good news. Overall very pleased in that regard. Nonetheless the appearance of the wound [name] improved today as well and that [name] excellent news. He still has pain but not seemingly as much as previous and I don't feel like there's much maceration either. 01/23/18 on evaluation today patient appears in our clinic a day early for his appointment due to the fact that he was having discomfort and issues with his wrap. Upon inspection of the wound actually appears to be doing fairly well which [name] good news. With that being said he [name] having a lot of discomfort at this point unfortunately. I think that this [name] mainly due to the fact that he [name] having such drainage as the wound bed itself seems to be doing well. I do believe the [facility] Dressing well [name] helpful for him which [name] good news. Nonetheless again I think he may need more frequent dressing changes. 01/31/18 on  evaluation today patient actually appears to be doing very well in regard to his right medial ankle ulcer. This  [name] significantly better than what was even noted last week I do think that the extra dressing change with the nurse visit toward the end of the week made a significant improvement for him. Overall I'm very pleased with how things appear today. 02/07/18 on evaluation today patient actually appears to be doing a little better in regard to his wound. The measurements are smaller today compared to previous. Fortunately there's no evidence of any infection and I do believe the moisture [name] much better control with more frequent wrap changes. Overall the patient seems to be making good progress. 02/14/18 on evaluation today patient actually appears to be doing very well in regard to his medial ankle ulcer of the right lower extremity. With that being said he saw slough buildup and this [name] still been somewhat slow to heal. For that reason my suggestion currently [name] gonna be that we may need to see about checking on a skin substitute possibly Apligraf to see if this will be of benefit for him. In the past he has benefited from Apligraf. Overall there does not appear to be any evidence of infection at this time which [name] definitely good news. 02/28/18 on evaluation today patient's medial ankle ulcer on the right actually appears to be doing significantly better compared to previous. He has been tolerating the dressing changes without complication. Fortunately there appears to be no evidence of infection which [name] great news. Overall I do feel like that he has made improvement although there has been some delay in general and the wound [name] taken a little longer than I would've liked up to this point. Nonetheless there's no sign of infection and therefore I think that we can proceed with the Apligraf applications we actually got this approved for him. That something that I'm gonna see about ordering for  next week. 03/07/18 on evaluation today patient unfortunately appears to be doing significantly worse than normal as far as his overall and general appearance [name] concerned. He has been tolerating the dressing changes without complication. With that being said nothing appears to be problematic in regard to his wound. The issue that I see right now involves more his general malaise and the fact that he does appear to have atrial fibrillation which [name] a known issue but unfortunately he [name] dropping low to the point of having 3-5 seconds of sustained pulses that are around 30 bpm. This was confirmed actually during evaluation today by myself. He's been having left lower quadrant pain and was actually in the emergency department on 17 October most recently. According to the notes reviewed it did not appear that anything was identified as far as a cause of the reason for this. It does not appear to the wound related which [name] good news. Nonetheless still I'm unsure of exactly what [name] going on but I am worried on top of everything else and the patient feeling lethargic he showed up late for his appointment today which [name] very uncharacteristic for this gentleman. 03/15/18; the patient was sent to the ER last week during his evaluation but he was not admitted. I have not reviewed the emergency room record. He states he feels better. He [name] here for application of Apligraf #1. The Apligraf we had would've expired tomorrow so he was brought in early 03/28/18 on evaluation today patient actually appears to be doing well in my pinion in regard to the overall appearance of his right lower Trinity ulcer. He has been tolerating the dressing changes without complication.  Fortunately there does not appear to be any evidence of infection at this time which [name] also good news. He does seem to have tolerated the first Apligraf Kiper, Onyx ([id number]) application. He [name] here for number two today. 04/11/18 upon  evaluation today patient actually appears to be doing very well in regard to his right medial malleolus ulcer. He has been tolerating the dressing changes specifically the compression wrap without complication. The Apligraf does seem to be beneficial for him he showing signs of good improvement which [name] excellent news. Overall I'm very pleased even though the measurements have not dramatically improve the appearance of the wound has. 04/25/18 on evaluation today patient actually appears to be doing much better in regard to his right medial ankle ulcer. He has been tolerating the dressing changes without complication specifically the Apligraf and the compression wrap. At this point I think he has made great progress even since last week. Fortunately there does not appear to be any evidence of infection at this time. 05/09/18 on evaluation today patient appears to be doing very well in regard to his lower Trinity ulcer. In fact this has made great progress since last time I seen him there's just one small area remaining this [name] at the top where it was worse. Fortunately there [name] no sign of infection and I do believe he's making great progress. This [name] his last application of the Apligraf today. 05/23/18 on evaluation today patient's wound actually appears to be showing signs of good improvement. He has completed his series of Apligraf at this point. All in all I do believe this was of great benefit for him and were very close to seeing this completely resolved. 05/30/18 upon evaluation today patient actually appears to be doing fairly well in regard to his lower extremity ulcer. He has been tolerating the compression wrap without complication. With that being said it does appear at this point that there still a small opening at the proximal portion where the entire wound was although there's good epithelialization noted here as well to some Slough at this point. Nonetheless I did perform sharp  debridement to remove some of the slough today. Patient History Information obtained from Patient. Family History Heart Disease - Mother,Father, Hypertension - Mother,Father, Stroke - Father, No family history of Cancer, Diabetes, Hereditary Spherocytosis, Thyroid Problems, Tuberculosis. Social History Current every day smoker, Marital Status - Single, Alcohol Use - Never, Drug Use - No History, Caffeine Use - Daily. Medical History Hospitalization/Surgery History - 05/17/2012, [facility], CVA. - 03/17/2017, [facility], Pneumonia. Medical And Surgical History Notes Ear/Nose/Mouth/Throat difficulty speaking Respiratory Hospitalized for Pneumonia 02/2017 Neurologic CVA in 2014 Review of Systems (ROS) Constitutional Symptoms (General Health) Denies complaints Mark symptoms of Fever, Chills. Respiratory The patient has no complaints Mark symptoms. Cardiovascular Complains Mark has symptoms of LE edema. Psychiatric The patient has no complaints Mark symptoms. Horseman, [name] ([id number]) Objective Constitutional Well-nourished and well-hydrated in no acute distress. Vitals Time Taken: 8:11 AM, Height: 74 in, Weight: 220 lbs, BMI: 28.2, Temperature: 98.1 F, Pulse: 49 bpm, Respiratory Rate: 18 breaths/min, Blood Pressure: 109/52 mmHg. Respiratory normal breathing without difficulty. Psychiatric this patient [name] able to make decisions and demonstrates good insight into disease process. Alert and Oriented x 3. pleasant and cooperative. General Notes: Patient's wound post debridement appear to be doing significantly better which [name] great news. He tolerated this feeling minimal discomfort which [name] good news. Integumentary (Hair, Skin) Wound #8 status [name] Open. Original cause of wound was Gradually Appeared.  The wound [name] located on the Right,Proximal,Medial Lower Leg. The wound measures 1cm length x 1.1cm width x 0.1cm depth; 0.864cm^2 area and 0.086cm^3 volume. There [name] Fat Layer (Subcutaneous Tissue)  Exposed exposed. There [name] no tunneling Mark undermining noted. There [name] a small amount of serous drainage noted. The wound margin [name] flat and intact. There [name] large (67-100%) red, pink granulation within the wound bed. There [name] a small (1-33%) amount of necrotic tissue within the wound bed including Adherent Slough. The periwound skin appearance exhibited: Scarring, Hemosiderin Staining. The periwound skin appearance did not exhibit: Callus, Crepitus, Excoriation, Induration, Rash, Dry/Scaly, Maceration, Atrophie Blanche, Cyanosis, Ecchymosis, Mottled, Pallor, Rubor, Erythema. Periwound temperature was noted as No Abnormality. The periwound has tenderness on palpation. 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 There was a Excisional Skin/Subcutaneous Tissue Debridement with a total area of 1.1 sq cm performed by STONE III, HOYT E., PA-C. With the following instrument(s): Curette to remove Viable and Non-Viable tissue/material. Material removed includes Subcutaneous Tissue, Slough, and Fibrin/Exudate after achieving pain control using Lidocaine 4% Topical Solution. No specimens were taken. A time out was conducted at 08:38, prior to the start of the procedure. A Minimum amount of bleeding was controlled with Pressure. The procedure was tolerated well with a pain level of 0 throughout and a pain level of 0 following the procedure. Post Debridement Measurements: 1cm length x 1.1cm width x 0.2cm depth; 0.173cm^3 volume. Character of Wound/Ulcer Post  Debridement [name] improved. Severity of Tissue Post Debridement [name]: Fat layer exposed. Post procedure Diagnosis Wound #8: Same as Pre-Procedure Plan Wound Cleansing: Wound #8 Right,Proximal,Medial Lower Leg: Clean wound with Normal Saline. Cleanse wound with mild soap and water Anesthetic (add to Medication List): Wound #8 Right,Proximal,Medial Lower Leg: Topical Lidocaine 4% cream applied to wound bed prior to debridement (In Clinic Only). Skin Barriers/Peri-Wound Care: Wound #8 Right,Proximal,Medial Lower Leg: Moisturizing lotion - not on wound area Primary Wound Dressing: Xeroform Secondary Dressing: Wound #8 Right,Proximal,Medial Lower Leg: Dry Gauze Dressing Change Frequency: Wound #8 Right,Proximal,Medial Lower Leg: Change dressing every week Follow-up Appointments: Wound #8 Right,Proximal,Medial Lower Leg: Return Appointment in 1 week. Edema Control: Wound #8 Right,Proximal,Medial Lower Leg: 3 Layer Compression System - Right Lower Extremity - unna to anchor Additional Orders / Instructions: Wound #8 Right,Proximal,Medial Lower Leg: Increase protein intake. My suggestion currently [name] gonna be that we continue with the above wound care measures for the next week. The patient [name] in agreement with plan. We will subsequently see were things stand at follow-up. I'm hopeful that the switch to Xeroform will be of benefit for him. Please see above for specific wound care orders. We will see patient for re-evaluation in 1 week(s) here in the clinic. If anything worsens Mark changes patient will contact our office for additional recommendations. GARELD, OBRECHT ([id number]) Electronic Signature(s) Signed: 05/30/2018 5:17:02 PM By: Worthy Keeler PA-C Entered By: Worthy Keeler on 05/30/2018 09:25:28 Tse, [name] ([id number]) -------------------------------------------------------------------------------- ROS/PFSH Details Patient Name: [name] Date of Service:  05/30/2018 8:15 AM Medical Record Number: [id number] Patient Account Number: [id number] Date of Birth/Sex: [birth year] (67 y.o. M) Treating RN: Montey Hora Primary Care  Provider: Royetta Crochet Other Clinician: Referring Provider: Royetta Crochet Treating Provider/Extender: Melburn Hake, HOYT Weeks in Treatment: 31 Information Obtained From Patient Wound History Do you currently have one Mark more open woundso Yes How many open wounds do you currently haveo 1 Approximately how long have you had your woundso 1 week How have you been treating your wound(s) until nowo lotion Has your wound(s) ever healed and then re-openedo Yes Have you had any lab work done in the past montho No Have you tested positive for an antibiotic resistant organism (MRSA, VRE)o No Have you tested positive for osteomyelitis (bone infection)o No Have you had any tests for circulation on your legso Yes Who ordered the testo [facility] Where was the test doneo [facility] Constitutional Symptoms (General Health) Complaints and Symptoms: Negative for: Fever; Chills Cardiovascular Complaints and Symptoms: Positive for: LE edema Medical History: Positive for: Arrhythmia - a fib; Deep Vein Thrombosis; Hypertension Negative for: Angina; Congestive Heart Failure; Coronary Artery Disease; Hypotension; Myocardial Infarction; Peripheral Arterial Disease; Peripheral Venous Disease; Phlebitis; Vasculitis Eyes Medical History: Negative for: Cataracts; Glaucoma; Optic Neuritis Ear/Nose/Mouth/Throat Medical History: Negative for: Chronic sinus problems/congestion; Middle ear problems Past Medical History Notes: difficulty speaking Hematologic/Lymphatic Medical History: Negative for: Anemia; Hemophilia; Human Immunodeficiency Virus; Lymphedema; Sickle Cell Disease Respiratory Sirmons, Carlester ([id number]) Complaints and Symptoms: No Complaints Mark Symptoms Medical History: Positive for: Chronic Obstructive Pulmonary Disease  (COPD) Negative for: Aspiration; Asthma; Pneumothorax; Sleep Apnea; Tuberculosis Past Medical History Notes: Hospitalized for Pneumonia 02/2017 Gastrointestinal Medical History: Negative for: Cirrhosis ; Colitis; Crohnos; Hepatitis A; Hepatitis B; Hepatitis C Endocrine Medical History: Positive for: Type II Diabetes Negative for: Type I Diabetes Time with diabetes: since 2017 Treated with: Diet Blood sugar tested every day: No Genitourinary Medical History: Negative for: End Stage Renal Disease Immunological Medical History: Negative for: Lupus Erythematosus; Raynaudos; Scleroderma Integumentary (Skin) Medical History: Negative for: History of Burn; History of pressure wounds Musculoskeletal Medical History: Positive for: Gout Negative for: Rheumatoid Arthritis; Osteoarthritis; Osteomyelitis Neurologic Medical History: Positive for: Neuropathy Negative for: Dementia; Quadriplegia; Paraplegia; Seizure Disorder Past Medical History Notes: CVA in 2014 Oncologic Medical History: Negative for: Received Chemotherapy; Received Radiation Psychiatric ASSER, LUCENA ([id number]) Complaints and Symptoms: No Complaints Mark Symptoms Medical History: Negative for: Anorexia/bulimia; Confinement Anxiety Immunizations Pneumococcal Vaccine: Received Pneumococcal Vaccination: Yes Immunization Notes: up to date Implantable Devices Hospitalization / Surgery History Name of Hospital Purpose of Hospitalization/Surgery Date [place] CVA 05/17/2012 [place] Pneumonia 03/17/2017 Family and Social History Cancer: No; Diabetes: No; Heart Disease: Yes - Mother,Father; Hereditary Spherocytosis: No; Hypertension: Yes - Mother,Father; Stroke: Yes - Father; Thyroid Problems: No; Tuberculosis: No; Current every day smoker; Marital Status - Single; Alcohol Use: Never; Drug Use: No History; Caffeine Use: Daily; Financial Concerns: No; Food, Clothing Mark Shelter Needs: No; Support System Lacking: No;  Transportation Concerns: No; Advanced Directives: No; Patient does not want information on Advanced Directives Physician Affirmation I have reviewed and agree with the above information. Electronic Signature(s) Signed: 05/30/2018 5:17:02 PM By: Worthy Keeler PA-C Signed: 05/30/2018 5:22:36 PM By: Montey Hora Entered By: Worthy Keeler on 05/30/2018 09:24:57 Noori, [name] ([id number]) -------------------------------------------------------------------------------- SuperBill Details Patient Name: [name] Date of Service: 05/30/2018 Medical Record Number: [id number] Patient Account Number: [id number] Date of Birth/Sex: 1951-01-09 (67 y.o. M) Treating RN: Montey Hora Primary Care Provider: Royetta Crochet Other Clinician: Referring Provider: Royetta Crochet Treating Provider/Extender: STONE III, HOYT Weeks in Treatment: 31 Diagnosis Coding ICD-10 Codes Code Description I87.2 Venous insufficiency (chronic) (peripheral) I89.0 Lymphedema, not elsewhere classified  E11.622 Type 2 diabetes mellitus with other skin ulcer L97.812 Non-pressure chronic ulcer of other part of right lower leg with fat layer exposed Facility Procedures CPT4 Code Description: 15947076 11042 - DEB SUBQ TISSUE 20 SQ CM/< ICD-10 Diagnosis Description J51.834 Non-pressure chronic ulcer of other part of right lower leg wit Modifier: h fat layer expo Quantity: 1 sed Physician Procedures CPT4 Code Description: 3735789 11042 - WC PHYS SUBQ TISS 20 SQ CM ICD-10 Diagnosis Description B84.784 Non-pressure chronic ulcer of other part of right lower leg wit Modifier: h fat layer expo Quantity: 1 sed Electronic Signature(s) Signed: 05/30/2018 5:17:02 PM By: Worthy Keeler PA-C Entered By: Worthy Keeler on 05/30/2018 09:25:39

## 2018-06-02 NOTE — Progress Notes (Signed)
[name] ([id number]) Visit Report for 05/30/2018 Arrival Information Details Patient Name: [name], [name] Date of Service: 05/30/2018 8:15 AM Medical Record Number: [id number] Patient Account Number: [id number] Date of Birth/Sex: 21-Jan-1951 (67 y.o. M) Treating RN: Montey Hora Primary Care Pranay Hilbun: Royetta Crochet Other Clinician: Referring Samrat Hayward: Royetta Crochet Treating Shain Pauwels/Extender: Melburn Hake, HOYT Weeks in Treatment: 44 Visit Information History Since Last Visit Added or deleted any medications: No Patient Arrived: Ambulatory Any new allergies or adverse reactions: No Arrival Time: 08:10 Had a fall or experienced change in No Accompanied By: self activities of daily living that may affect Transfer Assistance: None risk of falls: Patient Identification Verified: Yes Signs or symptoms of abuse/neglect since last visito No Secondary Verification Process Yes Hospitalized since last visit: No Completed: Implantable device outside of the clinic excluding No Patient Requires Transmission-Based No cellular tissue based products placed in the center Precautions: since last visit: Patient Has Alerts: Yes Has Dressing in Place as Prescribed: Yes Patient Alerts: Patient on Blood Pain Present Now: Yes Thinner DMII warfarin Electronic Signature(s) Signed: 05/30/2018 1:47:13 PM By: Lorine Bears RCP, RRT, CHT Entered By: Lorine Bears on 05/30/2018 08:10:49 [name] ([id number]) -------------------------------------------------------------------------------- Encounter Discharge Information Details Patient Name: [name] Date of Service: 05/30/2018 8:15 AM Medical Record Number: [id number] Patient Account Number: [id number] Date of Birth/Sex: [birth year] (67 y.o. M) Treating RN: Montey Hora Primary Care Tyleah Loh: Royetta Crochet Other Clinician: Referring Justn Quale: Royetta Crochet Treating Jarmal Lewelling/Extender:  Melburn Hake, HOYT Weeks in Treatment: 31 Encounter Discharge Information Items Post Procedure Vitals Discharge Condition: Stable Temperature (F): 98.1 Ambulatory Status: Ambulatory Pulse (bpm): 49 Discharge Destination: Home Respiratory Rate (breaths/min): 18 Transportation: Private Auto Blood Pressure (mmHg): 109/52 Accompanied By: self Schedule Follow-up Appointment: Yes Clinical Summary of Care: Electronic Signature(s) Signed: 05/30/2018 5:22:36 PM By: Montey Hora Entered By: Montey Hora on 05/30/2018 08:42:05 [name] ([id number]) -------------------------------------------------------------------------------- Lower Extremity Assessment Details Patient Name: [name] Date of Service: 05/30/2018 8:15 AM Medical Record Number: [id number] Patient Account Number: [id number] Date of Birth/Sex: [birth year] (67 y.o. M) Treating RN: Harold Barban Primary Care Cambelle Suchecki: Royetta Crochet Other Clinician: Referring Tailor Lucking: Royetta Crochet Treating Letta Cargile/Extender: STONE III, HOYT Weeks in Treatment: 31 Edema Assessment Assessed: [Left: No] [Right: No] E[Left: dema] [Right: :] Calf Left: Right: Point of Measurement: 34 cm From Medial Instep cm 38.2 cm Ankle Left: Right: Point of Measurement: 12 cm From Medial Instep cm 22 cm Vascular Assessment Pulses: Dorsalis Pedis Palpable: [Right:Yes] Posterior Tibial Palpable: [Right:Yes] Extremity colors, hair growth, and conditions: Hair Growth on Extremity: [Right:No] Temperature of Extremity: [Right:Warm] Capillary Refill: [Right:< 3 seconds] Toe Nail Assessment Left: Right: Thick: Yes Discolored: Yes Deformed: Yes Improper Length and Hygiene: No Electronic Signature(s) Signed: 06/01/2018 4:21:01 PM By: Harold Barban Entered By: Harold Barban on 05/30/2018 08:25:58 [name] ([id number]) -------------------------------------------------------------------------------- Multi Wound Chart  Details Patient Name: [name] Date of Service: 05/30/2018 8:15 AM Medical Record Number: [id number] Patient Account Number: [id number] Date of Birth/Sex: November 03, 1950 (67 y.o. 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] [N/A:N/A] Secondary Etiology: [8:Lymphedema] [N/A:N/A] Comorbid History: [8:Chronic Obstructive Pulmonary Disease (COPD), Arrhythmia, Deep Vein Thrombosis, Hypertension, Type II  Diabetes, Gout, Neuropathy] [N/A:N/A] Date Acquired: [8:10/17/2017] [N/A:N/A] Weeks of Treatment: [8:31] [N/A:N/A] Wound Status: [8:Open] [N/A:N/A] Measurements L x W x D [8:1x1.1x0.1] [N/A:N/A] (cm) Area (cm) : [8:0.864] [N/A:N/A] Volume (cm) : [8:0.086] [N/A:N/A] % Reduction in Area: [8:-423.60%] [N/A:N/A] % Reduction in Volume: [8:-437.50%] [N/A:N/A] Classification: [8:Grade 2] [N/A:N/A] Exudate Amount: [8:Small] [N/A:N/A] Exudate Type: [8:Serous] [N/A:N/A] Exudate Color: [8:amber] [N/A:N/A] Wound Margin: [8:Flat and Intact] [N/A:N/A] Granulation Amount: [8:Large (67-100%)] [N/A:N/A] Granulation Quality: [8:Red, Pink] [N/A:N/A] Necrotic Amount: [8:Small (1-33%)] [N/A:N/A] Exposed Structures: [8:Fat Layer (Subcutaneous Tissue) Exposed: Yes Fascia: No Tendon: No Muscle: No Joint: No Bone: No] [N/A:N/A] Epithelialization: Medium (34-66%) N/A N/A Periwound Skin Texture: Scarring: Yes N/A N/A Excoriation: No Induration: No Callus: No Crepitus:  No Rash: No Periwound Skin Moisture: Maceration: No N/A N/A Dry/Scaly: No Periwound Skin Color: Hemosiderin Staining: Yes N/A N/A Atrophie Blanche: No Cyanosis: No Ecchymosis: No Erythema: No Mottled: No Pallor: No Rubor: No Temperature: No Abnormality N/A N/A Tenderness on Palpation: Yes N/A N/A Wound Preparation: Ulcer Cleansing: N/A N/A Rinsed/Irrigated with Saline, Other: soap and water Topical Anesthetic Applied: Other: lidocaine 4% Treatment Notes Electronic Signature(s) Signed: 05/30/2018 5:22:36 PM By: Montey Hora Entered By: Montey Hora on 05/30/2018 08:39:00 Chriscoe, Juleen [name] ([id number]) -------------------------------------------------------------------------------- Multi-Disciplinary Care Plan Details Patient Name: [name] Date of Service: 05/30/2018 8:15 AM Medical Record Number: [id number] Patient Account Number: [id number] Date of Birth/Sex: [birth year] (67 y.o. M) Treating RN: Montey Hora Primary Care Yee Gangi: Royetta Crochet Other Clinician: Referring Treyvon Blahut: Royetta Crochet Treating Clifford Benninger/Extender: Melburn Hake, HOYT Weeks in Treatment: 31 Active Inactive Abuse / Safety / Falls / Self Care Management Nursing Diagnoses: Potential for falls Goals: Patient will not experience any injury related to falls Date Initiated: 10/25/2017 Target Resolution Date: 02/18/2018 Goal Status: Active Interventions: Assess Activities of Daily Living upon admission and as needed Assess fall risk on admission and as needed Assess: immobility, friction, shearing, incontinence upon admission and as needed Assess impairment of mobility on admission and as needed per policy Assess personal safety and home safety (as indicated) on admission and as needed Assess self care needs on admission and as needed Notes: Nutrition Nursing Diagnoses: Imbalanced nutrition Impaired glucose control: actual or potential Potential for alteratiion in  Nutrition/Potential for imbalanced nutrition Goals: Patient/caregiver agrees to and verbalizes understanding of need to use nutritional supplements and/or vitamins as prescribed Date Initiated: 10/25/2017 Target Resolution Date: 02/18/2018 Goal Status: Active Patient/caregiver will maintain therapeutic glucose control Date Initiated: 10/25/2017 Target Resolution Date: 02/18/2018 Goal Status: Active Interventions: Assess patient nutrition upon admission and as needed per policy Provide education on elevated blood sugars and impact on wound healing Provide education on nutrition EZZARD, DITMER ([id number]) Notes: Orientation to the Wound Care Program Nursing Diagnoses: Knowledge deficit related to the wound healing center program Goals: Patient/caregiver will verbalize understanding of the [place] Program Date Initiated: 10/25/2017 Target Resolution Date: 11/19/2017 Goal Status: Active Interventions: Provide education on orientation to the wound center Notes: Wound/Skin Impairment Nursing Diagnoses: Impaired tissue integrity Knowledge deficit related to ulceration/compromised skin integrity Goals: Ulcer/skin breakdown will have a volume reduction of 80% by week 12 Date Initiated: 10/25/2017 Target Resolution Date: 02/11/2018 Goal Status: Active Interventions: Assess patient/caregiver ability to perform ulcer/skin care regimen upon admission and as needed Assess ulceration(s) every visit Notes: Electronic Signature(s) Signed: 05/30/2018 5:22:36 PM By: Montey Hora Entered By: Montey Hora on 05/30/2018 08:38:52 Bong, Juleen [name] ([id number]) -------------------------------------------------------------------------------- Pain Assessment Details Patient Name: [name] Date of Service: 05/30/2018 8:15 AM Medical Record Number: [id number] Patient Account Number:  [id number] Date of Birth/Sex: [birth year] (68 y.o. M) Treating RN: Montey Hora Primary  Care Traxton Kolenda: Royetta Crochet Other Clinician: Referring Melis Trochez: Royetta Crochet Treating Amalya Salmons/Extender: STONE III, HOYT Weeks in Treatment: 31 Active Problems Location of Pain Severity and Description of Pain Patient Has Paino Yes Site Locations Rate the pain. Current Pain Level: 5 Pain Management and Medication Current Pain Management: Electronic Signature(s) Signed: 05/30/2018 1:47:13 PM By: Paulla Fore, RRT, CHT Signed: 05/30/2018 5:22:36 PM By: Montey Hora Entered By: Lorine Bears on 05/30/2018 08:11:00 Mulvaney, Juleen [name] ([id number]) -------------------------------------------------------------------------------- Patient/Caregiver Education Details Patient Name: [name] Date of Service: 05/30/2018 8:15 AM Medical Record Number: [id number] Patient Account Number: [id number] Date of Birth/Gender: May 17, 1951 (67 y.o. M) Treating RN: Montey Hora Primary Care Physician: Royetta Crochet Other Clinician: Referring Physician: Royetta Crochet Treating Physician/Extender: Sharalyn Ink in Treatment: 31 Education Assessment Education Provided To: Patient Education Topics Provided Venous: Handouts: Other: leg elevation Methods: Explain/Verbal Responses: State content correctly Electronic Signature(s) Signed: 05/30/2018 5:22:36 PM By: Montey Hora Entered By: Montey Hora on 05/30/2018 08:42:59 Hardgrove, Juleen [name] ([id number]) -------------------------------------------------------------------------------- Wound Assessment Details Patient Name: [name] Date of Service: 05/30/2018 8:15 AM Medical Record Number: [id number] Patient Account Number: [id number] Date of Birth/Sex: [birth year] (67 y.o. M) Treating RN: Harold Barban Primary Care Valen Gillison: Royetta Crochet Other Clinician: Referring Kenyan Karnes: Royetta Crochet Treating Sreenidhi Ganson/Extender: STONE III, HOYT Weeks in Treatment: 31 Wound Status Wound  Number: 8 Primary Diabetic Wound/Ulcer of the Lower Extremity Etiology: Wound Location: Right Lower Leg - Medial, Proximal Secondary Lymphedema Wounding Event: Gradually Appeared Etiology: Date Acquired: 10/17/2017 Wound Open Weeks Of Treatment: 31 Status: Clustered Wound: No Comorbid Chronic Obstructive Pulmonary Disease History: (COPD), Arrhythmia, Deep Vein Thrombosis, Hypertension, Type II Diabetes, Gout, Neuropathy Photos Photo Uploaded By: Harold Barban on 05/30/2018 10:00:15 Wound Measurements Length: (cm) 1 Width: (cm) 1.1 Depth: (cm) 0.1 Area: (cm) 0.864 Volume: (cm) 0.086 % Reduction in Area: -423.6% % Reduction in Volume: -437.5% Epithelialization: Medium (34-66%) Tunneling: No Undermining: No Wound Description Classification: Grade 2 Wound Margin: Flat and Intact Exudate Amount: Small Exudate Type: Serous Exudate Color: amber Foul Odor After Cleansing: No Slough/Fibrino Yes Wound Bed Granulation Amount: Large (67-100%) Exposed Structure Granulation Quality: Red, Pink Fascia Exposed: No Necrotic Amount: Small (1-33%) Fat Layer (Subcutaneous Tissue) Exposed: Yes Necrotic Quality: Adherent Slough Tendon Exposed: No Muscle Exposed: No Joint Exposed: No Lechuga, Garlon ([id number]) Bone Exposed: No Periwound Skin Texture Texture Color No Abnormalities Noted: No No Abnormalities Noted: No Callus: No Atrophie Blanche: No Crepitus: No Cyanosis: No Excoriation: No Ecchymosis: No Induration: No Erythema: No Rash: No Hemosiderin Staining: Yes Scarring: Yes Mottled: No Pallor: No Moisture Rubor: No No Abnormalities Noted: No Dry / Scaly: No Temperature / Pain Maceration: No Temperature: No Abnormality Tenderness on Palpation: Yes Wound Preparation Ulcer Cleansing: Rinsed/Irrigated with Saline, Other: soap and water, Topical Anesthetic Applied: Other: lidocaine 4%, Treatment Notes Wound #8 (Right, Proximal, Medial Lower  Leg) Notes xeroform, gause and unna to anchor and 3 layer wrap Electronic Signature(s) Signed: 06/01/2018 4:21:01 PM By: Harold Barban Entered By: Harold Barban on 05/30/2018 08:25:16 Koehler, Juleen [name] ([id number]) -------------------------------------------------------------------------------- Vitals Details Patient Name: [name] Date of Service: 05/30/2018 8:15 AM Medical Record Number: [id number] Patient Account Number: [id number] Date of Birth/Sex: December 27, 1950 (68 y.o. M) Treating RN: Montey Hora Primary Care Derrin Currey: Royetta Crochet Other Clinician: Referring Tangee Marszalek: Royetta Crochet Treating Eleny Cortez/Extender: STONE III, HOYT Weeks in Treatment: 31 Vital Signs Time Taken: 08:11 Temperature (F): 98.1 Height (in): 74 Pulse (bpm): 49 Weight (lbs):  220 Respiratory Rate (breaths/min): 18 Body Mass Index (BMI): 28.2 Blood Pressure (mmHg): 109/52 Reference Range: 80 - 120 mg / dl Electronic Signature(s) Signed: 05/30/2018 1:47:13 PM By: Lorine Bears RCP, RRT, CHT Entered By: Lorine Bears on 05/30/2018 08:14:05

## 2018-06-06 ENCOUNTER — Encounter: Payer: Medicare Other | Admitting: Physician Assistant

## 2018-06-06 DIAGNOSIS — E11622 Type 2 diabetes mellitus with other skin ulcer: Secondary | ICD-10-CM | POA: Diagnosis not present

## 2018-06-08 NOTE — Progress Notes (Signed)
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 and anterior tibial arteries not visualized due to wound bandages which they unfortunately did not take off. Right lower  extremity small vessel disease cannot be excluded due to limited visualization. There is of note that they want to follow-up with vascular lab study on 08/23/16. 3/7/ 18; patient comes in today with the wound bed in fairly good condition. No debridement. TheraSkin #1 08/04/16 no major change in wound dimensions although the base of this looks fairly healthy. No debridement TheraSkin #2 08/17/16- the patient is here for follow-up of a attenuation of his right lower [name]. He is status post 2 TheraSkin applications and he states he has an appointment for venous ablation with Dr.Dew on 4/13. 08/31/16; the patient had laser ablation by Dr. dew on 4/13. I think this involved both the greater and lesser saphenous veins. He tolerated this well. We have been putting TheraSkin on the wound every 2 weeks and he arrives with better-looking epithelialization today 09/14/16; the patient arrives today with an odor to his wound and some greenish necrotic surface over the wound approximately 70%. He had a small satellite lesion noted last week when we changed his dressing in between application of TheraSkin. I elected not to put that TheraSkin on today. 09/21/16; deterioration in the wound last week. I gave him empiric Cefdinir out of fear for a gram-negative infection although the CULTURE turned out to be negative. He completed his antibiotics this morning. Wound looks somewhat better, I put silver alginate on it last week again out of concern for infection. We do not have a TheraSkin this week not ordered last week 09/28/16; no major change from last week. We've looked over the volume of this wound and of not had major changes in spite of TheraSkin although the last TheraSkin was almost a month ago. We put silver alginate on last week out of fear of infection. I will switch to [facility] as looking over the records didn't really suggest that theraskin had helped 10/12/16; we'll use Hydrofera Blue starting last  week. No major change in the wound dimensions. 10/19/16; continue Hydrofera Blue. 1.8 x 2.5 x 0.2. Wound base looks healthy 10/26/16; continue with Hydrofera Blue. 1.5 x 2.4 x 0.2 11/02/16 no change in dimensions. Change from [facility] to Iodoflex 11/09/16; patient complains of increasing pain. Dimensions slightly larger. Last week I put Iodoflex on this wound to see if we can get a better surface I also increased him to 4 layer compression. He has not been systemically unwell. 11/16/16; patient states his leg feels better. He has completed his antibiotics. Dimensions are better. I change back to [employer] last week. We also change the dressing once on Friday which may have helped. Wound looks a lot better today than last week.. 2.2 x 2.4 x 0.3 11/23/16 on evaluation today patient's right lower extremity ulcer appears to be doing very well. He  has been tolerating the [facility] Dressing and tells me that fortunately he is finally improvement. He is pleased with how this is progressing. JACQUEZ, SHEETZ ([id number]) 11/30/16; improved using Hydrofera Blue 12/07/16; continue dramatic progress. Wound is now small and healthy looking. Continue use of Hydrofera Blue 12/14/16; very small wound albeit with some depth. Continued use of Hydrofera blue. 12/21/16 on evaluation today patient's right lower extremity ulcer appears healed at this point. He is having no discomfort and overall this is doing very well. And there's no sign of infection. 04/26/17; READMISSION Patient we know from previous stays in this clinic. The patient has known diabetic PAD and has a stent in the right leg. 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 When he was last in the clinic in August we had healed him out for what was felt to be a mostly venous insufficiency wound on the medial right leg. He follows with vascular surgery Dr. dew is at [place] [facility]. He has previously  undergone successful laser ablation. Reflux studies on 4/24 showed reflux in the common femoral, femoral and popliteal. He underwent successful ablation of the right greater saphenous vein from the distal thigh to the mid calf level. Successful ablation of the right small saphenous vein. He had unsuccessful ablation of the right greater saphenous vein from the saphenofemoral junction to the distal 5 level. The patient wears to let her compression stockings and he has been compliant with this With regards to his diabetes he is not currently on any treatment. He does describe pain in his leg which forces him to stop although I couldn't really quantify this. His last arterial studies were on 07/05/16 which showed triphasic waveforms on the right to the level of the popliteal artery. The right posterior tibial and anterior tibial artery were not visualized on this study. I don't actually see ABIs or TBIs. 05/04/17; the patient has small wounds on the right medial heel and the right medial leg. The area on the foot is just about closed. The area on the right medial leg is improved. He is tolerating the 3 layer compression without any complaints. The patient has both known PAD and chronic venous reflux [see discussion above]. 05/11/17; the areas on the patient's medial right heel with healed he still has an open area on the right medial leg. This does not require debridement today I think I did read it last week. We are using silver collagen under 3 layer compression. 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 He has been tolerating the dressing changes without complication. With that being said he does have some epithelialization growing into the wound bed and it appears this wound may be trying to healed in a depressed fashion. To be  honest that is not the worst case scenario and I think this may definitely do fine even if it heals in that way. There was no requirement for debridement today as patient appears to be doing very well in regard to his ulcer. His swelling was a little bit more severe than last week but nothing too significant. 07/05/17 on evaluation today patient appears to be doing rather well in regard to his right medial lower extremity ulcer. The wound does appear to be healing although it is healing and somewhat of a cratered fashion  the good news is he has excellent epithelium noted. Fortunately there does not appear to be any evidence of infection at this point he is tolerating the dressings as well is the wrap well. 07/12/17 on evaluation today patient appears to be doing very well in regard to his left medial lower extremity ulcer. He has been tolerating the dressing changes without complication. In fact compared to last week this wound which was somewhat concave has fielding dramatically and looks to be doing excellent. I'm extremely happy with the progress. He still has pain rated to be a 4-5/10 but the wound looks great. 07/20/17; the patient's right medial lower extremity ulcer is closed. He has good edema control. Unfortunately he does not really have adequate compression stockings. 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 He has been treated with compression stockings for some time now in fact we have notes having treated him even as far back as 2016 here in our clinic. Fortunately he has excellent blood flow and tends to heal well. Unfortunately despite wearing his  compression stockings, keeping his legs elevated, being on fluid pills (Lasix) and doing everything he can to try to help with fluid he still continues to have issues with this reopening. I think that he may be a good candidate for compression/lymphedema pumps. We have never attempted to get these for him previously. No fevers, chills, nausea, or vomiting noted at this time. Currently patient states his ulcer does hurt although not as bad as some in the past it's also not as large as it has been in the past when it reopened he talked is somewhat early. Obviously this is good news. TYSHAUN, VINZANT ([id number]) 12/13/17 on evaluation today patient appears to actually be doing very well in regard to his right medial ankle ulcer compared to last week's evaluation. I think having him on the correct antibiotics has made all the difference in the world. With that being said he's not having discomfort like he was previous and in general I feel like he has made excellent strides towards healing in the past week. No fevers, chills, nausea, or vomiting noted at this time. 01/10/18 on evaluation today patient appears to be doing a little worse compared to last week's evaluation. I think that though the Iodoflex has help with cleaning up the surface of the wound subsequently he has having a lot of drainage which I think is causing some issues in that regard. At this point I think we may want to switch the dressings. 01/17/18 on evaluation today patient appears to be doing rather well in regard to his right medial malleolus ulcer. The good news is I do feel like he showing some signs of improvement which is excellent. I feel like that the [facility] Dressing been better for him than the Iodoflex that we will previously utilizing. He is having much less in the way of discomfort which is good news. Overall very pleased in that regard. Nonetheless the appearance of the wound is improved today as well and that is  excellent news. He still has pain but not seemingly as much as previous and I don't feel like there's much maceration either. 01/23/18 on evaluation  today patient appears in our clinic a day early for his appointment due to the fact that he was having discomfort and issues with his wrap. Upon inspection of the wound actually appears to be doing fairly well which is good news. With that being said he is having a lot of discomfort at this point unfortunately. I think that this is mainly due to the fact that he is having such drainage as the wound bed itself seems to be doing well. I do believe the [facility] Dressing well is helpful for him which is good news. Nonetheless again I think he may need more frequent dressing changes. 01/31/18 on evaluation today patient actually appears to be doing very well in regard to his right medial ankle ulcer. This is significantly better than what was even noted last week I do think that the extra dressing change with the nurse visit toward the end of the week made a significant improvement for him. Overall I'm very pleased with how things appear today. 02/07/18 on evaluation today patient actually appears to be doing a little better in regard to his wound. The measurements are smaller today compared to previous. Fortunately there's no evidence of any infection and I do believe the moisture is much better control with more frequent wrap changes. Overall the patient seems to be making good progress. 02/14/18 on evaluation today patient actually appears to be doing very well in regard to his medial ankle ulcer of the right lower extremity. With that being said he saw slough buildup and this is still been somewhat slow to heal. For that reason my suggestion currently is gonna be that we may need to see about checking on a skin substitute possibly Apligraf to see if this will be of benefit for him. In the past he has benefited from Apligraf. Overall there does not appear  to be any evidence of infection at this time which is definitely good news. 02/28/18 on evaluation today patient's medial ankle ulcer on the right actually appears to be doing significantly better compared to previous. He has been tolerating the dressing changes without complication. Fortunately there appears to be no evidence of infection which is great news. Overall I do feel like that he has made improvement although there has been some delay in general and the wound is taken a little longer than I would've liked up to this point. Nonetheless there's no sign of infection and therefore I think that we can proceed with the Apligraf applications we actually got this approved for him. That something that I'm gonna see about ordering for next week. 03/07/18 on evaluation today patient unfortunately appears to be doing significantly worse than normal as far as his overall and general appearance is concerned. He has been tolerating the dressing changes without complication. With that being said nothing appears to be problematic in regard to his wound. The issue that I see right now involves more his general malaise and the fact that he does appear to have atrial fibrillation which is a known issue but unfortunately he is dropping low to the point of having 3-5 seconds of sustained pulses that are around 30 bpm. This was confirmed actually during evaluation today by myself. He's been having left lower quadrant pain and was actually in the emergency department on 17 October most recently. According to the notes reviewed it did not appear that anything was identified as far as a cause of the reason for this. It does not appear to the wound  related which is good news. Nonetheless still I'm unsure of exactly what is going on but I am worried on top of everything else and the patient feeling lethargic he showed up late for his appointment today which is very uncharacteristic for this gentleman. 03/15/18; the  patient was sent to the ER last week during his evaluation but he was not admitted. I have not reviewed the emergency room record. He states he feels better. He is here for application of Apligraf #1. The Apligraf we had would've expired tomorrow so he was brought in early 03/28/18 on evaluation today patient actually appears to be doing well in my pinion in regard to the overall appearance of his right lower Trinity ulcer. He has been tolerating the dressing changes without complication. Fortunately there does not appear to be any evidence of infection at this time which is also good news. He does seem to have tolerated the first Apligraf [name], [name] ([id number]) application. He is here for number two today. 04/11/18 upon evaluation today patient actually appears to be doing very well in regard to his right medial malleolus ulcer. He has been tolerating the dressing changes specifically the compression wrap without complication. The Apligraf does seem to be beneficial for him he showing signs of good improvement which is excellent news. Overall I'm very pleased even though the measurements have not dramatically improve the appearance of the wound has. 04/25/18 on evaluation today patient actually appears to be doing much better in regard to his right medial ankle ulcer. He has been tolerating the dressing changes without complication specifically the Apligraf and the compression wrap. At this point I think he has made great progress even since last week. Fortunately there does not appear to be any evidence of infection at this time. 05/09/18 on evaluation today patient appears to be doing very well in regard to his lower Trinity ulcer. In fact this has made great progress since last time I seen him there's just one small area remaining this is at the top where it was worse. Fortunately there is no sign of infection and I do believe he's making great progress. This is his last application of  the Apligraf today. 05/23/18 on evaluation today patient's wound actually appears to be showing signs of good improvement. He has completed his series of Apligraf at this point. All in all I do believe this was of great benefit for him and were very close to seeing this completely resolved. 05/30/18 upon evaluation today patient actually appears to be doing fairly well in regard to his lower extremity ulcer. He has been tolerating the compression wrap without complication. With that being said it does appear at this point that there still a small opening at the proximal portion where the entire wound was although there's good epithelialization noted here as well to some Slough at this point. Nonetheless I did perform sharp debridement to remove some of the slough today. 06/06/18 on evaluation today patient appears to actually be doing very well in regard to his lower extremity ulcer. He has been tolerating the dressing changes without complication. Fortunately there does not appear to be any evidence of infection at this time which is good news. Overall I'm very pleased with the progress that he seems to be making. Patient History Information obtained from Patient. Family History Heart Disease - Mother,Father, Hypertension - Mother,Father, Stroke - Father, No family history of Cancer, Diabetes, Hereditary Spherocytosis, Thyroid Problems, Tuberculosis. Social History Current every day smoker, Marital Status -  Single, Alcohol Use - Never, Drug Use - No History, Caffeine Use - Daily. Medical History Hospitalization/Surgery History - 05/17/2012, [facility], CVA. - 03/17/2017, [facility], Pneumonia. Medical And Surgical History Notes Ear/Nose/Mouth/Throat difficulty speaking Respiratory Hospitalized for Pneumonia 02/2017 Neurologic CVA in 2014 Review of Systems (ROS) Constitutional Symptoms (General Health) Denies complaints or symptoms of Fever, Chills. Respiratory The patient has no complaints or  symptoms. Cardiovascular Complains or has symptoms of LE edema. [name], [name] ([id number]) Psychiatric The patient has no complaints or symptoms. Objective Constitutional Well-nourished and well-hydrated in no acute distress. Vitals Time Taken: 8:11 AM, Height: 74 in, Weight: 220 lbs, BMI: 28.2, Temperature: 97.6 F, Pulse: 53 bpm, Respiratory Rate: 18 breaths/min, Blood Pressure: 146/85 mmHg. Respiratory normal breathing without difficulty. Psychiatric this patient is able to make decisions and demonstrates good insight into disease process. Alert and Oriented x 3. pleasant and cooperative. General Notes: Patient's wound on inspection today shows to be about half the size it was last week. His swelling is still very well controlled with the compression wrap. I do believe he is close to complete resolution although we are not quite there yet. This form seem to have done well for him which is great news sharp debridement was required minimally to remove some [facility] as well as dry skin from the edge of the wound he tolerated this without complication today post debridement the wound appear to be much better. Integumentary (Hair, Skin) Wound #8 status is Open. Original cause of wound was Gradually Appeared. The wound is located on the Right,Proximal,Medial Lower Leg. The wound measures 0.5cm length x 0.9cm width x 0.1cm depth; 0.353cm^2 area and 0.035cm^3 volume. There is Fat Layer (Subcutaneous Tissue) Exposed exposed. There is no tunneling noted. There is a small amount of serous drainage noted. The wound margin is flat and intact. There is large (67-100%) red, pink granulation within the wound bed. There is a small (1-33%) amount of necrotic tissue within the wound bed including Adherent Slough. The periwound skin appearance exhibited: Scarring, Hemosiderin Staining. The periwound skin appearance did not exhibit: Callus, Crepitus, Excoriation, Induration, Rash, Dry/Scaly,  Maceration, Atrophie Blanche, Cyanosis, Ecchymosis, Mottled, Pallor, Rubor, Erythema. Periwound temperature was noted as No Abnormality. The periwound has tenderness on palpation. 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 There was a Excisional Skin/Subcutaneous Tissue Debridement with a total area of 0.45 sq cm performed by STONE III, HOYT E., PA-C. With the following instrument(s): Curette Material removed includes Subcutaneous Tissue, Slough, Skin: Dermis, and Skin: Epidermis after achieving pain control using Lidocaine 4% Topical Solution. No specimens were taken. A time out was conducted at 08:30, prior to the start of the procedure. A Minimum amount of bleeding was controlled with Pressure. The procedure was tolerated well with a pain level of 0 throughout and a pain level of 0 following the procedure. Post Debridement Measurements: 0.5cm length x 0.9cm width x 0.2cm depth; 0.071cm^3 volume. Character of Wound/Ulcer Post Debridement is improved. Severity of Tissue Post Debridement is: Fat layer exposed. Post procedure Diagnosis Wound #8: Same as Pre-Procedure Plan Wound Cleansing: Wound #8 Right,Proximal,Medial Lower Leg: Clean wound with Normal Saline. Cleanse wound with mild  soap and water Anesthetic (add to Medication List): Wound #8 Right,Proximal,Medial Lower Leg: Topical Lidocaine 4% cream applied to wound bed prior to debridement (In Clinic Only). Skin Barriers/Peri-Wound Care: Wound #8 Right,Proximal,Medial Lower Leg: Moisturizing  lotion - not on wound area Primary Wound Dressing: Xeroform Secondary Dressing: Wound #8 Right,Proximal,Medial Lower Leg: Dry Gauze Non-adherent pad Dressing Change Frequency: Wound #8 Right,Proximal,Medial Lower Leg: Change dressing every week Follow-up Appointments: Wound #8 Right,Proximal,Medial Lower Leg: Return Appointment in 1 week. Edema Control: Wound #8 Right,Proximal,Medial Lower Leg: 3 Layer Compression System - Right Lower Extremity - unna to anchor Additional Orders / Instructions: Wound #8 Right,Proximal,Medial Lower Leg: Increase protein intake. Ariola, [name] ([id number]) I'm gonna recommend that we continue with the above wound care measures for the next week. Patient is in agreement with the plan. If the patient has any concerns or questions he will let me know. Otherwise will see were things stand upon reevaluation. Please see above for specific wound care orders. We will see patient for re-evaluation in 1 week(s) here in the clinic. If anything worsens or changes patient will contact our office for additional recommendations. Electronic Signature(s) Signed: 06/08/2018 9:23:56 AM By: Worthy Keeler PA-C Entered By: Worthy Keeler on 06/06/2018 08:37:09 [place], [name] ([id number]) -------------------------------------------------------------------------------- ROS/PFSH Details Patient Name: [name] Date of Service: 06/06/2018 8:00 AM Medical Record Number: [id number] Patient Account Number: [id number] Date of Birth/Sex: [birth year] (67 y.o. M) Treating RN: Montey Hora Primary Care Provider: Royetta Crochet Other Clinician: Referring Provider: Royetta Crochet Treating Provider/Extender: Melburn Hake, HOYT Weeks in Treatment: 32 Information Obtained From Patient Wound History Do you currently have one or more open woundso Yes How many open wounds do you currently haveo 1 Approximately how long have you had your woundso 1 week How have you  been treating your wound(s) until nowo lotion Has your wound(s) ever healed and then re-openedo Yes Have you had any lab work done in the past montho No Have you tested positive for an antibiotic resistant organism (MRSA, VRE)o No Have you tested positive for osteomyelitis (bone infection)o No Have you had any tests for circulation on your legso Yes Who ordered the testo [facility] Where was the test doneo [facility] Constitutional Symptoms (General Health) Complaints and Symptoms: Negative for: Fever; Chills Cardiovascular Complaints and Symptoms: Positive for: LE edema Medical History: Positive for: Arrhythmia - a fib; Deep Vein Thrombosis; Hypertension Negative for: Angina; Congestive Heart Failure; Coronary Artery Disease; Hypotension; Myocardial Infarction; Peripheral Arterial Disease; Peripheral Venous Disease; Phlebitis; Vasculitis Eyes Medical History: Negative for: Cataracts; Glaucoma; Optic Neuritis Ear/Nose/Mouth/Throat Medical History: Negative for: Chronic sinus problems/congestion; Middle ear problems Past Medical History Notes: difficulty speaking Hematologic/Lymphatic Medical History: Negative for: Anemia; Hemophilia; Human Immunodeficiency Virus; Lymphedema; Sickle Cell Disease Respiratory Artus, Dicky ([id number]) Complaints and Symptoms: No Complaints or Symptoms Medical History: Positive for: Chronic Obstructive Pulmonary Disease (COPD) Negative for: Aspiration; Asthma; Pneumothorax; Sleep Apnea; Tuberculosis Past Medical History Notes: Hospitalized for Pneumonia 02/2017 Gastrointestinal Medical History: Negative for: Cirrhosis ; Colitis; Crohnos; Hepatitis A; Hepatitis B; Hepatitis C Endocrine Medical History: Positive for: Type II Diabetes Negative for: Type I Diabetes Time with diabetes: since 2017 Treated with: Diet Blood sugar tested every day: No Genitourinary Medical History: Negative for: End Stage Renal Disease Immunological Medical  History: Negative for: Lupus Erythematosus; Raynaudos; Scleroderma Integumentary (Skin) Medical History: Negative for: History of Burn; History of pressure wounds Musculoskeletal Medical History: Positive for: Gout Negative for: Rheumatoid Arthritis; Osteoarthritis; Osteomyelitis Neurologic Medical History: Positive for: Neuropathy Negative for:  Dementia; Quadriplegia; Paraplegia; Seizure Disorder Past Medical History Notes: CVA in 2014 Oncologic Medical History: Negative for: Received Chemotherapy; Received Radiation Psychiatric KHANI, PAINO ([id number]) Complaints and Symptoms: No Complaints or Symptoms Medical History: Negative for: Anorexia/bulimia; Confinement Anxiety Immunizations Pneumococcal Vaccine: Received Pneumococcal Vaccination: Yes Immunization Notes: up to date Implantable Devices Hospitalization / Surgery History Name of Hospital Purpose of Hospitalization/Surgery Date [facility] CVA 05/17/2012 [place] Pneumonia 03/17/2017 Family and Social History Cancer: No; Diabetes: No; Heart Disease: Yes - Mother,Father; Hereditary Spherocytosis: No; Hypertension: Yes - Mother,Father; Stroke: Yes - Father; Thyroid Problems: No; Tuberculosis: No; Current every day smoker; Marital Status - Single; Alcohol Use: Never; Drug Use: No History; Caffeine Use: Daily; Financial Concerns: No; Food, Clothing or Shelter Needs: No; Support System Lacking: No; Transportation Concerns: No; Advanced Directives: No; Patient does not want information on Advanced Directives Physician Affirmation I have reviewed and agree with the above information. Electronic Signature(s) Signed: 06/06/2018 5:03:48 PM By: Montey Hora Signed: 06/08/2018 9:23:56 AM By: Worthy Keeler PA-C Entered By: Worthy Keeler on 06/06/2018 08:36:01 Piascik, [name] ([id number]) -------------------------------------------------------------------------------- SuperBill Details Patient Name: [name] Date  of Service: 06/06/2018 Medical Record Number: [id number] Patient Account Number: [id number] Date of Birth/Sex: [birth year] (67 y.o. M) Treating RN: Montey Hora Primary Care Provider: Royetta Crochet Other Clinician: Referring Provider: Royetta Crochet Treating Provider/Extender: Melburn Hake, HOYT Weeks in Treatment: 32 Diagnosis Coding ICD-10 Codes Code Description I87.2 Venous insufficiency (chronic) (peripheral) I89.0 Lymphedema, not elsewhere classified E11.622 Type 2 diabetes mellitus with other skin ulcer L97.812 Non-pressure chronic ulcer of other part of right lower leg with fat layer exposed Facility Procedures CPT4 Code Description: 61607371 11042 - DEB SUBQ TISSUE 20 SQ CM/< ICD-10 Diagnosis Description G62.694 Non-pressure chronic ulcer of other part of right lower leg wit Modifier: h fat layer expo Quantity: 1 sed Physician Procedures CPT4 Code Description: 8546270 11042 - WC PHYS SUBQ TISS 20 SQ CM ICD-10 Diagnosis Description J50.093 Non-pressure chronic ulcer of other part of right lower leg wit Modifier: h fat layer expo Quantity: 1 sed Electronic Signature(s) Signed: 06/08/2018 9:23:56 AM By: Worthy Keeler PA-C Entered By: Worthy Keeler on 06/06/2018 08:37:18

## 2018-06-08 NOTE — Progress Notes (Signed)
DEVEON, KISIEL ([id number]) Visit Report for 06/06/2018 Arrival Information Details Patient Name: [name], [name] Date of Service: 06/06/2018 8:00 AM Medical Record Number: [id number] Patient Account Number: [id number] Date of Birth/Sex: [birth year] (67 y.o. M) Treating RN: Montey Hora Primary Care Alaycia Eardley: Royetta Crochet Other Clinician: Referring Rigdon Macomber: Royetta Crochet Treating Ondria Oswald/Extender: Melburn Hake, HOYT Weeks in Treatment: 82 Visit Information History Since Last Visit Added or deleted any medications: No Patient Arrived: Ambulatory Any new allergies or adverse reactions: No Arrival Time: 08:10 Had a fall or experienced change in No Accompanied By: self activities of daily living that may affect Transfer Assistance: None risk of falls: Patient Identification Verified: Yes Signs or symptoms of abuse/neglect since last visito No Secondary Verification Process Yes Hospitalized since last visit: No Completed: Implantable device outside of the clinic excluding No Patient Requires Transmission-Based No cellular tissue based products placed in the center Precautions: since last visit: Patient Has Alerts: Yes Has Dressing in Place as Prescribed: Yes Patient Alerts: Patient on Blood Pain Present Now: Yes Thinner DMII warfarin Electronic Signature(s) Signed: 06/06/2018 4:33:45 PM By: Lorine Bears RCP, RRT, CHT Entered By: Lorine Bears on 06/06/2018 08:11:17 Mangen, [name] ([id number]) -------------------------------------------------------------------------------- Encounter Discharge Information Details Patient Name: [name] Date of Service: 06/06/2018 8:00 AM Medical Record Number: [id number] Patient Account Number: [id number] Date of Birth/Sex: Jun 09, 1950 (67 y.o. M) Treating RN: Montey Hora Primary Care Tanor Glaspy: Royetta Crochet Other Clinician: Referring Haila Dena: Royetta Crochet Treating Challen Spainhour/Extender:  Melburn Hake, HOYT Weeks in Treatment: 16 Encounter Discharge Information Items Post Procedure Vitals Discharge Condition: Stable Temperature (F): 97.6 Ambulatory Status: Ambulatory Pulse (bpm): 53 Discharge Destination: Home Respiratory Rate (breaths/min): 16 Transportation: Private Auto Blood Pressure (mmHg): 146/85 Accompanied By: self Schedule Follow-up Appointment: Yes Clinical Summary of Care: Electronic Signature(s) Signed: 06/06/2018 5:03:48 PM By: Montey Hora Entered By: Montey Hora on 06/06/2018 08:34:41 [name], [name] ([id number]) -------------------------------------------------------------------------------- Lower Extremity Assessment Details Patient Name: [name] Date of Service: 06/06/2018 8:00 AM Medical Record Number: [id number] Patient Account Number: [id number] Date of Birth/Sex: [birth year] (67 y.o. M) Treating RN: Cornell Barman Primary Care Aubri Gathright: Royetta Crochet Other Clinician: Referring Peyson Postema: Royetta Crochet Treating Rechel Delosreyes/Extender: Melburn Hake, HOYT Weeks in Treatment: 32 Edema Assessment Assessed: [Left: No] [Right: No] E[Left: dema] [Right: :] Calf Left: Right: Point of Measurement: 34 cm From Medial Instep cm 40 cm Ankle Left: Right: Point of Measurement: 12 cm From Medial Instep cm 27.5 cm Vascular Assessment Claudication: Claudication Assessment [Right:Intermittent] Pulses: Dorsalis Pedis Palpable: [Right:Yes] Posterior Tibial Extremity colors, hair growth, and conditions: Extremity Color: [Right:Hyperpigmented] Hair Growth on Extremity: [Right:Yes] Temperature of Extremity: [Right:Warm] Capillary Refill: [Right:< 3 seconds] Toe Nail Assessment Left: Right: Thick: Yes Discolored: Yes Deformed: Yes Improper Length and Hygiene: Yes Electronic Signature(s) Signed: 06/06/2018 5:15:57 PM By: Gretta Cool, [name], RN, CWS, [name], [name] Entered By: Gretta Cool, [name], RN, CWS, Kim on 06/06/2018 08:22:07 [name], [name]  ([id number]) -------------------------------------------------------------------------------- Multi Wound Chart Details Patient Name: [name] Date of Service: 06/06/2018 8:00 AM Medical Record Number: [id number] Patient Account Number: [id number] Date of Birth/Sex: June 19, 1950 (67 y.o. 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] [N/A:N/A] Secondary Etiology: [8:Lymphedema] [N/A:N/A] Comorbid  History: [8:Chronic Obstructive Pulmonary Disease (COPD), Arrhythmia, Deep Vein Thrombosis, Hypertension, Type II Diabetes, Gout, Neuropathy] [N/A:N/A] Date Acquired: [8:10/17/2017] [N/A:N/A] Weeks of Treatment: [8:32] [N/A:N/A] Wound Status: [8:Open] [N/A:N/A] Measurements L x W x D [8:0.5x0.9x0.1] [N/A:N/A] (cm) Area (cm) : [8:0.353] [N/A:N/A] Volume (cm) : [8:0.035] [N/A:N/A] % Reduction in Area: [8:-113.90%] [N/A:N/A] % Reduction in Volume: [8:-118.70%] [N/A:N/A] Classification: [8:Grade 2] [N/A:N/A] Exudate Amount: [8:Small] [N/A:N/A] Exudate Type: [8:Serous] [N/A:N/A] Exudate Color: [8:amber] [N/A:N/A] Wound Margin: [8:Flat and Intact] [N/A:N/A] Granulation Amount: [8:Large (67-100%)] [N/A:N/A] Granulation Quality: [8:Red, Pink] [N/A:N/A] Necrotic Amount: [8:Small (1-33%)] [N/A:N/A] Exposed Structures: [8:Fat Layer (Subcutaneous Tissue) Exposed: Yes Fascia: No Tendon: No Muscle: No Joint: No Bone: No] [N/A:N/A] Epithelialization: Medium (34-66%) N/A  N/A Periwound Skin Texture: Scarring: Yes N/A N/A Excoriation: No Induration: No Callus: No Crepitus: No Rash: No Periwound Skin Moisture: Maceration: No N/A N/A Dry/Scaly: No Periwound Skin Color: Hemosiderin Staining: Yes N/A N/A Atrophie Blanche: No Cyanosis: No Ecchymosis: No Erythema: No Mottled: No Pallor: No Rubor: No Temperature: No Abnormality N/A N/A Tenderness on Palpation: Yes N/A N/A Wound Preparation: Ulcer Cleansing: N/A N/A Rinsed/Irrigated with Saline, Other: soap and water Topical Anesthetic Applied: Other: lidocaine 4% Treatment Notes Electronic Signature(s) Signed: 06/06/2018 5:03:48 PM By: Montey Hora Entered By: Montey Hora on 06/06/2018 08:29:23 [name], [name] ([id number]) -------------------------------------------------------------------------------- Multi-Disciplinary Care Plan Details Patient Name: [name] Date of Service: 06/06/2018 8:00 AM Medical Record Number: [id number] Patient Account Number: [id number] Date of Birth/Sex: 05-23-1950 (67 y.o. M) Treating RN: Montey Hora Primary Care Haston Casebolt: Royetta Crochet Other Clinician: Referring Hermilo Dutter: Royetta Crochet Treating Aaro Meyers/Extender: Melburn Hake, HOYT Weeks in Treatment: 20 Active Inactive Abuse / Safety / Falls / Self Care Management Nursing Diagnoses: Potential for falls Goals: Patient will not experience any injury related to falls Date Initiated: 10/25/2017 Target Resolution Date: 02/18/2018 Goal Status: Active Interventions: Assess Activities of Daily Living upon admission and as needed Assess fall risk on admission and as needed Assess: immobility, friction, shearing, incontinence upon admission and as needed Assess impairment of mobility on admission and as needed per policy Assess personal safety and home safety (as indicated) on admission and as needed Assess self care needs on admission and as needed Notes: Nutrition Nursing  Diagnoses: Imbalanced nutrition Impaired glucose control: actual or potential Potential for alteratiion in Nutrition/Potential for imbalanced nutrition Goals: Patient/caregiver agrees to and verbalizes understanding of need to use nutritional supplements and/or vitamins as prescribed Date Initiated: 10/25/2017 Target Resolution Date: 02/18/2018 Goal Status: Active Patient/caregiver will maintain therapeutic glucose control Date Initiated: 10/25/2017 Target Resolution Date: 02/18/2018 Goal Status: Active Interventions: Assess patient nutrition upon admission and as needed per policy Provide education on elevated blood sugars and impact on wound healing Provide education on nutrition LATRAVIS, GRINE ([id number]) Notes: Orientation to the Wound Care Program Nursing Diagnoses: Knowledge deficit related to the wound healing center program Goals: Patient/caregiver will verbalize understanding of the [facility] Date Initiated: 10/25/2017 Target Resolution Date: 11/19/2017 Goal Status: Active Interventions: Provide education on orientation to the wound center Notes: Wound/Skin Impairment Nursing Diagnoses: Impaired tissue integrity Knowledge deficit related to ulceration/compromised skin integrity Goals: Ulcer/skin breakdown will have a volume reduction of 80% by week 12 Date Initiated: 10/25/2017 Target Resolution Date: 02/11/2018 Goal Status: Active Interventions: Assess patient/caregiver ability to perform ulcer/skin care regimen upon admission and as needed Assess ulceration(s) every visit Notes: Electronic Signature(s) Signed: 06/06/2018 5:03:48 PM By: Montey Hora Entered By: Montey Hora on 06/06/2018 08:29:14 [name], [name] ([id number]) -------------------------------------------------------------------------------- Pain Assessment Details Patient Name: [name]  Date of Service: 06/06/2018 8:00 AM Medical Record Number:  [id number] Patient Account Number: [id number] Date of Birth/Sex: Oct 09, 1950 (67 y.o. M) Treating RN: Montey Hora Primary Care Anthonymichael Munday: Royetta Crochet Other Clinician: Referring Johntavious Francom: Royetta Crochet Treating Caylen Yardley/Extender: STONE III, HOYT Weeks in Treatment: 32 Active Problems Location of Pain Severity and Description of Pain Patient Has Paino Yes Site Locations Rate the pain. Current Pain Level: 8 Pain Management and Medication Current Pain Management: Electronic Signature(s) Signed: 06/06/2018 4:33:45 PM By: Lorine Bears RCP, RRT, CHT Signed: 06/06/2018 5:03:48 PM By: Montey Hora Entered By: Lorine Bears on 06/06/2018 08:11:26 [name], [name] ([id number]) -------------------------------------------------------------------------------- Patient/Caregiver Education Details Patient Name: [name] Date of Service: 06/06/2018 8:00 AM Medical Record Number: [id number] Patient Account Number: [id number] Date of Birth/Gender: 12/13/1950 (67 y.o. M) Treating RN: Montey Hora Primary Care Physician: Royetta Crochet Other Clinician: Referring Physician: Royetta Crochet Treating Physician/Extender: Sharalyn Ink in Treatment: 38 Education Assessment Education Provided To: Patient Education Topics Provided Venous: Handouts: Other: leg elevation Methods: Explain/Verbal Responses: State content correctly Electronic Signature(s) Signed: 06/06/2018 5:03:48 PM By: Montey Hora Entered By: Montey Hora on 06/06/2018 08:33:52 [name], [name] ([id number]) -------------------------------------------------------------------------------- Wound Assessment Details Patient Name: [name] Date of Service: 06/06/2018 8:00 AM Medical Record Number: [id number] Patient Account Number: [id number] Date of Birth/Sex: Mar 21, 1951 (67 y.o. M) Treating RN: Cornell Barman Primary Care Aseel Uhde: Royetta Crochet Other  Clinician: Referring Kyndall Amero: Royetta Crochet Treating Jayson [name]/Extender: STONE III, HOYT Weeks in Treatment: 32 Wound Status Wound Number: 8 Primary Diabetic Wound/Ulcer of the Lower Extremity Etiology: Wound Location: Right Lower Leg - Medial, Proximal Secondary Lymphedema Wounding Event: Gradually Appeared Etiology: Date Acquired: 10/17/2017 Wound Open Weeks Of Treatment: 32 Status: Clustered Wound: No Comorbid Chronic Obstructive Pulmonary Disease History: (COPD), Arrhythmia, Deep Vein Thrombosis, Hypertension, Type II Diabetes, Gout, Neuropathy Photos Photo Uploaded By: Gretta Cool, [name], RN, CWS, Kim on 06/06/2018 13:08:05 Wound Measurements Length: (cm) 0.5 Width: (cm) 0.9 Depth: (cm) 0.1 Area: (cm) 0.353 Volume: (cm) 0.035 % Reduction in Area: -113.9% % Reduction in Volume: -118.7% Epithelialization: Medium (34-66%) Tunneling: No Wound Description Classification: Grade 2 Wound Margin: Flat and Intact Exudate Amount: Small Exudate Type: Serous Exudate Color: amber Foul Odor After Cleansing: No Slough/Fibrino Yes Wound Bed Granulation Amount: Large (67-100%) Exposed Structure Granulation Quality: Red, Pink Fascia Exposed: No Necrotic Amount: Small (1-33%) Fat Layer (Subcutaneous Tissue) Exposed: Yes Necrotic Quality: Adherent Slough Tendon Exposed: No Muscle Exposed: No Joint Exposed: No [name], [name] ([id number]) Bone Exposed: No Periwound Skin Texture Texture Color No Abnormalities Noted: No No Abnormalities Noted: No Callus: No Atrophie Blanche: No Crepitus: No Cyanosis: No Excoriation: No Ecchymosis: No Induration: No Erythema: No Rash: No Hemosiderin Staining: Yes Scarring: Yes Mottled: No Pallor: No Moisture Rubor: No No Abnormalities Noted: No Dry / Scaly: No Temperature / Pain Maceration: No Temperature: No Abnormality Tenderness on Palpation: Yes Wound Preparation Ulcer Cleansing: Rinsed/Irrigated with Saline, Other: soap  and water, Topical Anesthetic Applied: Other: lidocaine 4%, Treatment Notes Wound #8 (Right, Proximal, Medial Lower Leg) Notes xeroform, non adherent pad, gauze and unna to anchor and 3 layer wrap Electronic Signature(s) Signed: 06/06/2018 5:15:57 PM By: Gretta Cool, [name], RN, CWS, [name], [name] Entered By: Gretta Cool, [name], RN, CWS, Kim on 06/06/2018 08:20:08 [name], [name] ([id number]) -------------------------------------------------------------------------------- Vitals Details Patient Name: [name] Date of Service: 06/06/2018 8:00 AM Medical Record Number: [id number] Patient Account Number: [id number] Date of Birth/Sex: 1951-04-26 (67 y.o. M) Treating RN: Montey Hora Primary Care Spike Desilets: Royetta Crochet Other Clinician: Referring Shakinah Navis: Royetta Crochet  Treating Kaige Whistler/Extender: STONE III, HOYT Weeks in Treatment: 32 Vital Signs Time Taken: 08:11 Temperature (F): 97.6 Height (in): 74 Pulse (bpm): 53 Weight (lbs): 220 Respiratory Rate (breaths/min): 18 Body Mass Index (BMI): 28.2 Blood Pressure (mmHg): 146/85 Reference Range: 80 - 120 mg / dl Electronic Signature(s) Signed: 06/06/2018 4:33:45 PM By: Lorine Bears RCP, RRT, CHT Entered By: Lorine Bears on 06/06/2018 08:14:06

## 2018-06-13 ENCOUNTER — Encounter: Payer: Medicare Other | Admitting: Physician Assistant

## 2018-06-13 DIAGNOSIS — E11622 Type 2 diabetes mellitus with other skin ulcer: Secondary | ICD-10-CM | POA: Diagnosis not present

## 2018-06-15 NOTE — Progress Notes (Signed)
MARCELLOUS, [name] ([id number]) Visit Report for 06/13/2018 Arrival Information Details Patient Name: [name], [name] Date of Service: 06/13/2018 8:15 AM Medical Record Number: [id number] Patient Account Number: [id number] Date of Birth/Sex: Jan 30, 1951 (67 y.o. M) Treating RN: Montey Hora Primary Care Sundee Garland: Royetta Crochet Other Clinician: Referring Danyele Smejkal: Royetta Crochet Treating Zaydin Billey/Extender: Melburn Hake, HOYT Weeks in Treatment: 67 Visit Information History Since Last Visit Added or deleted any medications: No Patient Arrived: Ambulatory Any new allergies or adverse reactions: No Arrival Time: 08:10 Had a fall or experienced change in No Accompanied By: self activities of daily living that may affect Transfer Assistance: None risk of falls: Patient Requires Transmission-Based No Signs or symptoms of abuse/neglect since last visito No Precautions: Hospitalized since last visit: No Patient Has Alerts: Yes Implantable device outside of the clinic excluding No Patient Alerts: Patient on Blood cellular tissue based products placed in the center Thinner since last visit: DMII Has Dressing in Place as Prescribed: Yes warfarin Pain Present Now: Yes Electronic Signature(s) Signed: 06/13/2018 4:05:03 PM By: Lorine Bears RCP, RRT, CHT Entered By: Lorine Bears on 06/13/2018 08:12:48 Labrake, [name] ([id number]) -------------------------------------------------------------------------------- Compression Therapy Details Patient Name: [name] Date of Service: 06/13/2018 8:15 AM Medical Record Number: [id number] Patient Account Number: [id number] Date of Birth/Sex: [birth year] (67 y.o. M) Treating RN: Montey Hora Primary Care Khaniyah Bezek: Royetta Crochet Other Clinician: Referring Sandford Diop: Royetta Crochet Treating Rocky Gladden/Extender: STONE III, HOYT Weeks in Treatment: 33 Compression Therapy Performed for Wound Assessment: Wound  #8 Right,Proximal,Medial Lower Leg Performed By: Clinician Montey Hora, RN Compression Type: Three Layer Pre Treatment ABI: 1.2 Post Procedure Diagnosis Same as Pre-procedure Electronic Signature(s) Signed: 06/13/2018 4:47:13 PM By: Montey Hora Entered By: Montey Hora on 06/13/2018 08:44:46 Hilyard, [name] ([id number]) -------------------------------------------------------------------------------- Encounter Discharge Information Details Patient Name: [name] Date of Service: 06/13/2018 8:15 AM Medical Record Number: [id number] Patient Account Number: [id number] Date of Birth/Sex: Jul 30, 1950 (68 y.o. M) Treating RN: Montey Hora Primary Care Gad Aymond: Royetta Crochet Other Clinician: Referring Miniya Miguez: Royetta Crochet Treating Shikara Mcauliffe/Extender: Melburn Hake, HOYT Weeks in Treatment: 52 Encounter Discharge Information Items Discharge Condition: Stable Ambulatory Status: Ambulatory Discharge Destination: Home Transportation: Private Auto Accompanied By: self Schedule Follow-up Appointment: Yes Clinical Summary of Care: Electronic Signature(s) Signed: 06/13/2018 4:47:13 PM By: Montey Hora Entered By: Montey Hora on 06/13/2018 08:45:23 Brazzel, [name] ([id number]) -------------------------------------------------------------------------------- Lower Extremity Assessment Details Patient Name: [name] Date of Service: 06/13/2018 8:15 AM Medical Record Number: [id number] Patient Account Number: [id number] Date of Birth/Sex: [birth year] (67 y.o. M) Treating RN: Montey Hora Primary Care Markiah Janeway: Royetta Crochet Other Clinician: Referring Promyse Ardito: Royetta Crochet Treating Chenoa Luddy/Extender: Melburn Hake, HOYT Weeks in Treatment: 33 Edema Assessment Assessed: [Left: No] [Right: No] E[Left: dema] [Right: :] Calf Left: Right: Point of Measurement: 34 cm From Medial Instep cm 39.7 cm Ankle Left: Right: Point of Measurement: 12 cm From Medial  Instep cm 27.1 cm Vascular Assessment Pulses: Dorsalis Pedis Palpable: [Right:Yes] Posterior Tibial Extremity colors, hair growth, and conditions: Extremity Color: [Right:Hyperpigmented] Hair Growth on Extremity: [Right:No] Temperature of Extremity: [Right:Warm] Capillary Refill: [Right:< 3 seconds] Toe Nail Assessment Left: Right: Thick: Yes Discolored: Yes Deformed: Yes Improper Length and Hygiene: Yes Electronic Signature(s) Signed: 06/13/2018 4:47:13 PM By: Montey Hora Entered By: Montey Hora on 06/13/2018 08:21:27 Vowles, [name] ([id number]) -------------------------------------------------------------------------------- Multi Wound Chart Details Patient Name: [name] Date of Service: 06/13/2018 8:15 AM Medical Record Number: [id number] Patient Account Number: [id number] Date of Birth/Sex: [birth year] (67 y.o. 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] [N/A:N/A] Secondary Etiology: [8:Lymphedema] [N/A:N/A] Comorbid History: [8:Chronic Obstructive Pulmonary Disease (COPD), Arrhythmia, Deep Vein Thrombosis, Hypertension, Type II Diabetes, Gout, Neuropathy] [N/A:N/A] Date Acquired: [8:10/17/2017] [N/A:N/A] Weeks of Treatment: [8:33] [N/A:N/A] Wound Status: [8:Open] [N/A:N/A] Measurements L x W x D [8:0.1x0.1x0.1] [N/A:N/A] (cm) Area (cm) : [8:0.008] [N/A:N/A] Volume (cm) : [8:0.001] [N/A:N/A] % Reduction in Area: [8:95.20%]  [N/A:N/A] % Reduction in Volume: [8:93.80%] [N/A:N/A] Classification: [8:Grade 2] [N/A:N/A] Exudate Amount: [8:Small] [N/A:N/A] Exudate Type: [8:Serous] [N/A:N/A] Exudate Color: [8:amber] [N/A:N/A] Wound Margin: [8:Flat and Intact] [N/A:N/A] Granulation Amount: [8:Large (67-100%)] [N/A:N/A] Granulation Quality: [8:Red, Pink] [N/A:N/A] Necrotic Amount: [8:Small (1-33%)] [N/A:N/A] Exposed Structures: [8:Fat Layer (Subcutaneous Tissue) Exposed: Yes Fascia: No Tendon: No Muscle: No Joint: No Bone: No] [N/A:N/A] Epithelialization: Medium (34-66%) N/A N/A Periwound Skin Texture: Scarring: Yes N/A N/A Excoriation: No Induration: No Callus: No Crepitus: No Rash: No Periwound Skin Moisture: Maceration: No N/A N/A Dry/Scaly: No Periwound Skin Color: Hemosiderin Staining: Yes N/A N/A Atrophie Blanche: No Cyanosis: No Ecchymosis: No Erythema: No Mottled: No Pallor: No Rubor: No Temperature: No Abnormality N/A N/A Tenderness on Palpation: Yes N/A N/A Wound Preparation: Ulcer Cleansing: N/A N/A Rinsed/Irrigated with Saline, Other: soap and water Topical Anesthetic Applied: Other: lidocaine 4% Treatment Notes Electronic Signature(s) Signed: 06/13/2018 4:47:13 PM By: Montey Hora Entered By: Montey Hora on 06/13/2018 08:43:33 Blas, [name] ([id number]) -------------------------------------------------------------------------------- Multi-Disciplinary Care Plan Details Patient Name: [name] Date of Service: 06/13/2018 8:15 AM Medical Record Number: [id number] Patient Account Number: [id number] Date of Birth/Sex: 03-26-1951 (67 y.o. M) Treating RN: Montey Hora Primary Care Omarri Eich: Royetta Crochet Other Clinician: Referring Jani Ploeger: Royetta Crochet Treating Dannell Gortney/Extender: Melburn Hake, HOYT Weeks in Treatment: 14 Active Inactive Abuse / Safety / Falls / Self Care Management Nursing Diagnoses: Potential for falls Goals: Patient will not experience any  injury related to falls Date Initiated: 10/25/2017 Target Resolution Date: 02/18/2018 Goal Status: Active Interventions: Assess Activities of Daily Living upon admission and as needed Assess fall risk on admission and as needed Assess: immobility, friction, shearing, incontinence upon admission and as needed Assess impairment of mobility on admission and as needed per policy Assess personal safety and home safety (as indicated) on admission and as needed Assess self care needs on admission and as needed Notes: Nutrition Nursing Diagnoses: Imbalanced nutrition Impaired glucose control: actual or potential Potential for alteratiion in Nutrition/Potential for imbalanced nutrition Goals: Patient/caregiver agrees to and verbalizes understanding of need to use nutritional supplements and/or vitamins as prescribed Date Initiated: 10/25/2017 Target Resolution Date: 02/18/2018 Goal Status: Active Patient/caregiver will maintain therapeutic glucose control Date Initiated: 10/25/2017 Target Resolution Date: 02/18/2018 Goal Status: Active Interventions: Assess patient nutrition upon admission and as needed per policy Provide education on elevated blood sugars and impact on wound healing Provide education on nutrition DAVION, FLANNERY ([id number]) Notes: Orientation to the Wound Care Program Nursing Diagnoses: Knowledge deficit related to the wound healing center program Goals: Patient/caregiver will verbalize understanding of the [facility] Date Initiated: 10/25/2017 Target Resolution Date: 11/19/2017 Goal Status: Active Interventions: Provide education on orientation to the wound center Notes: Wound/Skin Impairment Nursing Diagnoses: Impaired tissue integrity Knowledge deficit related to ulceration/compromised skin integrity Goals: Ulcer/skin breakdown will have  a volume reduction of 80% by week 12 Date Initiated: 10/25/2017 Target Resolution Date: 02/11/2018 Goal  Status: Active Interventions: Assess patient/caregiver ability to perform ulcer/skin care regimen upon admission and as needed Assess ulceration(s) every visit Notes: Electronic Signature(s) Signed: 06/13/2018 4:47:13 PM By: Montey Hora Entered By: Montey Hora on 06/13/2018 08:43:27 Delpozo, [name] ([id number]) -------------------------------------------------------------------------------- Pain Assessment Details Patient Name: [name] Date of Service: 06/13/2018 8:15 AM Medical Record Number: [id number] Patient Account Number: [id number] Date of Birth/Sex: December 07, 1950 (68 y.o. M) Treating RN: Montey Hora Primary Care Errol Ala: Royetta Crochet Other Clinician: Referring Jaid Quirion: Royetta Crochet Treating Marica Trentham/Extender: STONE III, HOYT Weeks in Treatment: 2 Active Problems Location of Pain Severity and Description of Pain Patient Has Paino Yes Site Locations Rate the pain. Current Pain Level: 8 Pain Management and Medication Current Pain Management: Electronic Signature(s) Signed: 06/13/2018 4:05:03 PM By: Paulla Fore, RRT, CHT Signed: 06/13/2018 4:47:13 PM By: Montey Hora Entered By: Lorine Bears on 06/13/2018 08:12:57 Happe, [name] ([id number]) -------------------------------------------------------------------------------- Patient/Caregiver Education Details Patient Name: [name] Date of Service: 06/13/2018 8:15 AM Medical Record Number: [id number] Patient Account Number: [id number] Date of Birth/Gender: 1951/03/20 (67 y.o. M) Treating RN: Montey Hora Primary Care Physician: Royetta Crochet Other Clinician: Referring Physician: Royetta Crochet Treating Physician/Extender: Sharalyn Ink in Treatment: 2 Education Assessment Education Provided To: Patient Education Topics Provided Venous: Handouts: Other: leg elevation Methods: Explain/Verbal Responses: State content  correctly Electronic Signature(s) Signed: 06/13/2018 4:47:13 PM By: Montey Hora Entered By: Montey Hora on 06/13/2018 08:42:20 Metzgar, [name] ([id number]) -------------------------------------------------------------------------------- Wound Assessment Details Patient Name: [name] Date of Service: 06/13/2018 8:15 AM Medical Record Number: [id number] Patient Account Number: [id number] Date of Birth/Sex: 1950/10/15 (67 y.o. M) Treating RN: Montey Hora Primary Care Tyrick Dunagan: Royetta Crochet Other Clinician: Referring Tyrina Hines: Royetta Crochet Treating Michah Minton/Extender: STONE III, HOYT Weeks in Treatment: 33 Wound Status Wound Number: 8 Primary Diabetic Wound/Ulcer of the Lower Extremity Etiology: Wound Location: Right, Proximal, Medial Lower Leg Secondary Lymphedema Wounding Event: Gradually Appeared Etiology: Date Acquired: 10/17/2017 Wound Open Weeks Of Treatment: 33 Status: Clustered Wound: No Comorbid Chronic Obstructive Pulmonary Disease History: (COPD), Arrhythmia, Deep Vein Thrombosis, Hypertension, Type II Diabetes, Gout, Neuropathy Photos Photo Uploaded By: Harold Barban on 06/13/2018 11:35:03 Wound Measurements Length: (cm) 0.1 Width: (cm) 0.1 Depth: (cm) 0.1 Area: (cm) 0.008 Volume: (cm) 0.001 % Reduction in Area: 95.2% % Reduction in Volume: 93.8% Epithelialization: Medium (34-66%) Tunneling: No Undermining: No Wound Description Classification: Grade 2 Wound Margin: Flat and Intact Exudate Amount: Small Exudate Type: Serous Exudate Color: amber Foul Odor After Cleansing: No Slough/Fibrino Yes Wound Bed Granulation Amount: Large (67-100%) Exposed Structure Granulation Quality: Red, Pink Fascia Exposed: No Necrotic Amount: Small (1-33%) Fat Layer (Subcutaneous Tissue) Exposed: Yes Necrotic Quality: Adherent Slough Tendon Exposed: No Muscle Exposed: No Joint Exposed: No Boudreau, Naftoli ([id number]) Bone Exposed:  No Periwound Skin Texture Texture Color No Abnormalities Noted: No No Abnormalities Noted: No Callus: No Atrophie Blanche: No Crepitus: No Cyanosis: No Excoriation: No Ecchymosis: No Induration: No Erythema: No Rash: No Hemosiderin Staining: Yes Scarring: Yes Mottled: No Pallor: No Moisture Rubor: No No Abnormalities Noted: No Dry / Scaly: No Temperature / Pain Maceration: No Temperature: No Abnormality Tenderness on Palpation: Yes Wound Preparation Ulcer Cleansing: Rinsed/Irrigated with Saline, Other: soap and water, Topical Anesthetic Applied: Other: lidocaine 4%, Treatment Notes Wound #8 (Right, Proximal, Medial Lower Leg) Notes xeroform, non adherent pad, unna to anchor and 3 layer wrap Electronic Signature(s) Signed: 06/13/2018 4:47:13 PM By: Montey Hora Entered  By: Montey Hora on 06/13/2018 08:43:20 Tolleson, [name] ([id number]) -------------------------------------------------------------------------------- Vitals Details Patient Name: [name] Date of Service: 06/13/2018 8:15 AM Medical Record Number: [id number] Patient Account Number: [id number] Date of Birth/Sex: 04-Oct-1950 (67 y.o. M) Treating RN: Montey Hora Primary Care Demarrius Guerrero: Royetta Crochet Other Clinician: Referring Marijose Curington: Royetta Crochet Treating Augie Vane/Extender: STONE III, HOYT Weeks in Treatment: 33 Vital Signs Time Taken: 08:13 Temperature (F): 97.8 Height (in): 74 Pulse (bpm): 57 Weight (lbs): 220 Respiratory Rate (breaths/min): 16 Body Mass Index (BMI): 28.2 Blood Pressure (mmHg): 120/62 Reference Range: 80 - 120 mg / dl Airway Electronic Signature(s) Signed: 06/13/2018 4:05:03 PM By: Lorine Bears RCP, RRT, CHT Entered By: Lorine Bears on 06/13/2018 61:47:09

## 2018-06-15 NOTE — Progress Notes (Signed)
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 antibiotics this morning. Wound looks somewhat better, I put silver alginate on it last week again out of concern for infection. We do not have  a TheraSkin this week not ordered last week 09/28/16; no major change from last week. We've looked over the volume of this wound and of not had major changes in spite of TheraSkin although the last TheraSkin was almost a month ago. We put silver alginate on last week out of fear of infection. I will switch to [facility] as looking over the records didn't really suggest that theraskin had helped 10/12/16; we'll use Hydrofera Blue starting last week. No major change in the wound dimensions. 10/19/16; continue Hydrofera Blue. 1.8 x 2.5 x 0.2. Wound base looks healthy 10/26/16; continue with Hydrofera Blue. 1.5 x 2.4 x 0.2 11/02/16 no change in dimensions. Change from [facility] to Iodoflex 11/09/16; patient complains of increasing pain. Dimensions slightly larger. Last week I put Iodoflex on this wound to see if we can get a better surface I also increased him to 4 layer compression. He has not been systemically unwell. 11/16/16; patient states his leg feels better. He has completed his antibiotics. Dimensions are better. I change back to [facility] last week. We also change the dressing once on Friday which may have helped. Wound looks a lot better today than last week.. 2.2 x 2.4 x 0.3 11/23/16 on evaluation today patient's right lower extremity ulcer appears to be doing very well. He has been tolerating the [facility] Dressing and tells me that fortunately he is finally improvement. He is pleased with how this is progressing. JARVIN, OGREN ([id number]) 11/30/16; improved using Hydrofera Blue 12/07/16; continue dramatic progress. Wound is now small and healthy looking. Continue use of Hydrofera Blue 12/14/16; very small wound albeit with some depth. Continued use of Hydrofera blue. 12/21/16 on evaluation today patient's right lower extremity ulcer appears healed at this point. He is having no discomfort and overall this is doing very well. And there's no sign of infection. 04/26/17;  READMISSION Patient we know from previous stays in this clinic. The patient has known diabetic PAD and has a stent in the right leg. 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 When he was last in the clinic in August we had healed him out for what was felt to be a mostly venous insufficiency wound on the medial right leg. He follows with vascular surgery Dr. dew is at [place] [facility]. He has previously undergone successful laser ablation. Reflux studies on 4/24 showed reflux  in the common femoral, femoral and popliteal. He underwent successful ablation of the right greater saphenous vein from the distal thigh to the mid calf level. Successful ablation of the right small saphenous vein. He had unsuccessful ablation of the right greater saphenous vein from the saphenofemoral junction to the distal 5 level. The patient wears to let her compression stockings and he has been compliant with this With regards to his diabetes he is not currently on any treatment. He does describe pain in his leg which forces him to stop although I couldn't really quantify this. His last arterial studies were on 07/05/16 which showed triphasic waveforms on the right to the level of the popliteal artery. The right posterior tibial and anterior tibial artery were not visualized on this study. I don't actually see ABIs or TBIs. 05/04/17; the patient has small wounds on the right medial heel and the right medial leg. The area on the foot is just about closed. The area on the right medial leg is improved. He is tolerating the 3 layer compression without any complaints. The patient has both known PAD and chronic venous reflux [see discussion above]. 05/11/17; the areas on the patient's medial right heel with healed he still has an open area on the right medial leg. This does not require debridement today I think I did read it last week. We are using silver collagen under 3 layer  compression. 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 He has been tolerating the dressing changes without complication. With that being said he does have some epithelialization growing into the wound bed and it appears this wound may be trying to healed in a depressed fashion. To be honest that is not the worst case scenario and I think this may definitely do fine even if it heals in that way. There was no requirement for debridement today as patient appears to be doing very well in regard to his ulcer. His swelling was a little bit more severe than last week but nothing too significant. 07/05/17 on evaluation today patient appears to be doing rather well in regard to his right medial lower extremity ulcer. The wound does appear to be healing although it is healing and somewhat of a cratered fashion the good news is he has excellent epithelium noted. Fortunately there does not appear to be any evidence of infection at this point he is tolerating the dressings as well is the wrap well. 07/12/17 on evaluation today patient appears to be doing very well in regard to his left medial lower extremity ulcer. He has been tolerating the dressing changes without complication. In fact compared to last week this wound which was somewhat concave has fielding dramatically and looks to be doing excellent. I'm extremely happy with the progress. He still has pain rated to be a 4-5/10 but the wound looks great. 07/20/17; the patient's right medial lower extremity ulcer is closed. He has good edema control. Unfortunately he does not really have adequate compression stockings. Certainly what he brought in the blood on his right leg might of been support hose however there is whole in the stocking already Readmission: 10/25/17 patient seen today for  reevaluation due to a recurrence of the right medial ankle venous leg ulcer. 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 He  has been treated with compression stockings for some time now in fact we have notes having treated him even as far back as 2016 here in our clinic. Fortunately he has excellent blood flow and tends to heal well. Unfortunately despite wearing his compression stockings, keeping his legs elevated, being on fluid pills (Lasix) and doing everything he can to try to help with fluid he still continues to have issues with this reopening. I think that he may be a good candidate for compression/lymphedema pumps. We have never attempted to get these for him previously. No fevers, chills, nausea, or vomiting noted at this time. Currently patient states his ulcer does hurt although not as bad as some in the past it's also not as large as it has been in the past when it reopened he talked is somewhat early. Obviously this is good news. KYION, GAUTIER ([id number]) 12/13/17 on evaluation today patient appears to actually be doing very well in regard to his right medial ankle ulcer compared to last week's evaluation. I think having him on the correct antibiotics has made all the difference in the world. With that being said he's not having discomfort like he was previous and in general I feel like he has made excellent strides towards healing in the past week. No fevers, chills, nausea, or vomiting noted at this time. 01/10/18 on evaluation today patient appears to be doing a little worse compared to last week's evaluation. I think that though the Iodoflex has help with cleaning up the surface of the wound subsequently he has having a lot of drainage which I think is causing some issues in that regard. At this point I think we may want to switch the dressings. 01/17/18 on evaluation today patient appears to be doing rather  well in regard to his right medial malleolus ulcer. The good news is I do feel like he showing some signs of improvement which is excellent. I feel like that the [facility] Dressing been better for him than the Iodoflex that we will previously utilizing. He is having much less in the way of discomfort which is good news. Overall very pleased in that regard. Nonetheless the appearance of the wound is improved today as well and that is excellent news. He still has pain but not seemingly as much as previous and I don't feel like there's much maceration either. 01/23/18 on evaluation today patient appears in our clinic a day early for his appointment due to the fact that he was having discomfort and issues with his wrap. Upon inspection of the wound actually appears to be doing fairly well which is good news. With that being said he is having a lot of discomfort at this point unfortunately. I think that this is mainly due to the fact that he is having such drainage as the wound bed itself seems to be doing well. I do believe the [facility] Dressing well is helpful for him which is good news. Nonetheless again I think he may need more frequent dressing changes. 01/31/18 on evaluation today patient actually appears to be doing very well in regard to his right medial ankle ulcer. This is significantly better than what was even noted last week I do think that the extra dressing change with the nurse visit toward the end of the week made a significant improvement for him. Overall I'm very pleased with how things appear today. 02/07/18 on evaluation today patient actually appears to be doing a little better in regard to his  wound. The measurements are smaller today compared to previous. Fortunately there's no evidence of any infection and I do believe the moisture is much better control with more frequent wrap changes. Overall the patient seems to be making good progress. 02/14/18 on evaluation today  patient actually appears to be doing very well in regard to his medial ankle ulcer of the right lower extremity. With that being said he saw slough buildup and this is still been somewhat slow to heal. For that reason my suggestion currently is gonna be that we may need to see about checking on a skin substitute possibly Apligraf to see if this will be of benefit for him. In the past he has benefited from Apligraf. Overall there does not appear to be any evidence of infection at this time which is definitely good news. 02/28/18 on evaluation today patient's medial ankle ulcer on the right actually appears to be doing significantly better compared to previous. He has been tolerating the dressing changes without complication. Fortunately there appears to be no evidence of infection which is great news. Overall I do feel like that he has made improvement although there has been some delay in general and the wound is taken a little longer than I would've liked up to this point. Nonetheless there's no sign of infection and therefore I think that we can proceed with the Apligraf applications we actually got this approved for him. That something that I'm gonna see about ordering for next week. 03/07/18 on evaluation today patient unfortunately appears to be doing significantly worse than normal as far as his overall and general appearance is concerned. He has been tolerating the dressing changes without complication. With that being said nothing appears to be problematic in regard to his wound. The issue that I see right now involves more his general malaise and the fact that he does appear to have atrial fibrillation which is a known issue but unfortunately he is dropping low to the point of having 3-5 seconds of sustained pulses that are around 30 bpm. This was confirmed actually during evaluation today by myself. He's been having left lower quadrant pain and was actually in the emergency department on 17  October most recently. According to the notes reviewed it did not appear that anything was identified as far as a cause of the reason for this. It does not appear to the wound related which is good news. Nonetheless still I'm unsure of exactly what is going on but I am worried on top of everything else and the patient feeling lethargic he showed up late for his appointment today which is very uncharacteristic for this gentleman. 03/15/18; the patient was sent to the ER last week during his evaluation but he was not admitted. I have not reviewed the emergency room record. He states he feels better. He is here for application of Apligraf #1. The Apligraf we had would've expired tomorrow so he was brought in early 03/28/18 on evaluation today patient actually appears to be doing well in my pinion in regard to the overall appearance of his right lower Trinity ulcer. He has been tolerating the dressing changes without complication. Fortunately there does not appear to be any evidence of infection at this time which is also good news. He does seem to have tolerated the first Apligraf Sprinkle, Izmael ([id number]) application. He is here for number two today. 04/11/18 upon evaluation today patient actually appears to be doing very well in regard to his right medial malleolus ulcer.  He has been tolerating the dressing changes specifically the compression wrap without complication. The Apligraf does seem to be beneficial for him he showing signs of good improvement which is excellent news. Overall I'm very pleased even though the measurements have not dramatically improve the appearance of the wound has. 04/25/18 on evaluation today patient actually appears to be doing much better in regard to his right medial ankle ulcer. He has been tolerating the dressing changes without complication specifically the Apligraf and the compression wrap. At this point I think he has made great progress even since last  week. Fortunately there does not appear to be any evidence of infection at this time. 05/09/18 on evaluation today patient appears to be doing very well in regard to his lower Trinity ulcer. In fact this has made great progress since last time I seen him there's just one small area remaining this is at the top where it was worse. Fortunately there is no sign of infection and I do believe he's making great progress. This is his last application of the Apligraf today. 05/23/18 on evaluation today patient's wound actually appears to be showing signs of good improvement. He has completed his series of Apligraf at this point. All in all I do believe this was of great benefit for him and were very close to seeing this completely resolved. 05/30/18 upon evaluation today patient actually appears to be doing fairly well in regard to his lower extremity ulcer. He has been tolerating the compression wrap without complication. With that being said it does appear at this point that there still a small opening at the proximal portion where the entire wound was although there's good epithelialization noted here as well to some Slough at this point. Nonetheless I did perform sharp debridement to remove some of the slough today. 06/06/18 on evaluation today patient appears to actually be doing very well in regard to his lower extremity ulcer. He has been tolerating the dressing changes without complication. Fortunately there does not appear to be any evidence of infection at this time which is good news. Overall I'm very pleased with the progress that he seems to be making. 06/13/18 on evaluation today patient's wound appears to be almost completely healed. He is new skin growing over the surface of the wound which is great news. Fortunately he's been tolerating the dressing changes without complication. Overall been very pleased with how things have progressed in fact today this appears to be doing excellent. Overall  the patient is also pleased no need for sharp debridement today which is actually the best news he's heard in quite some time. Patient History Information obtained from Patient. Family History Heart Disease - Mother,Father, Hypertension - Mother,Father, Stroke - Father, No family history of Cancer, Diabetes, Hereditary Spherocytosis, Thyroid Problems, Tuberculosis. Social History Current every day smoker, Marital Status - Single, Alcohol Use - Never, Drug Use - No History, Caffeine Use - Daily. Medical History Eyes Denies history of Cataracts, Glaucoma, Optic Neuritis Ear/Nose/Mouth/Throat Denies history of Chronic sinus problems/congestion, Middle ear problems Hematologic/Lymphatic Denies history of Anemia, Hemophilia, Human Immunodeficiency Virus, Lymphedema, Sickle Cell Disease Respiratory Patient has history of Chronic Obstructive Pulmonary Disease (COPD) Denies history of Aspiration, Asthma, Pneumothorax, Sleep Apnea, Tuberculosis Cardiovascular Patient has history of Arrhythmia - a fib, Deep Vein Thrombosis, Hypertension Denies history of Angina, Congestive Heart Failure, Coronary Artery Disease, Hypotension, Myocardial Infarction, Peripheral Arterial Disease, Peripheral Venous Disease, Phlebitis, Vasculitis Rathel, Ejay ([id number]) Gastrointestinal Denies history of Cirrhosis , Colitis, Crohn s,  Hepatitis A, Hepatitis B, Hepatitis C Endocrine Patient has history of Type II Diabetes Denies history of Type I Diabetes Genitourinary Denies history of End Stage Renal Disease Immunological Denies history of Lupus Erythematosus, Raynaud s, Scleroderma Integumentary (Skin) Denies history of History of Burn, History of pressure wounds Musculoskeletal Patient has history of Gout Denies history of Rheumatoid Arthritis, Osteoarthritis, Osteomyelitis Neurologic Patient has history of Neuropathy Denies history of Dementia, Quadriplegia, Paraplegia, Seizure  Disorder Oncologic Denies history of Received Chemotherapy, Received Radiation Psychiatric Denies history of Anorexia/bulimia, Confinement Anxiety Hospitalization/Surgery History - 05/17/2012, [facility], CVA. - 03/17/2017, [facility], Pneumonia. Medical And Surgical History Notes Ear/Nose/Mouth/Throat difficulty speaking Respiratory Hospitalized for Pneumonia 02/2017 Neurologic CVA in 2014 Review of Systems (ROS) Constitutional Symptoms (General Health) Denies complaints or symptoms of Fever, Chills. Respiratory The patient has no complaints or symptoms. Cardiovascular Complains or has symptoms of LE edema. Psychiatric The patient has no complaints or symptoms. Objective Constitutional Well-nourished and well-hydrated in no acute distress. Vitals Time Taken: 8:13 AM, Height: 74 in, Weight: 220 lbs, BMI: 28.2, Temperature: 97.8 F, Pulse: 57 bpm, Respiratory Rate: 16 breaths/min, Blood Pressure: 120/62 mmHg. Shawhan, [name] ([id number]) Respiratory normal breathing without difficulty. clear to auscultation bilaterally. Cardiovascular regular rate and rhythm with normal S1, S2. Psychiatric this patient is able to make decisions and demonstrates good insight into disease process. Alert and Oriented x 3. pleasant and cooperative. General Notes: Patient's wound bed currently shows evidence of good granulation at this time. He has been tolerating the dressing changes without complication. He fact has excellent epithelialization pretty much covering the entire area of the right lower extremity. Overall this seems to be doing excellent. Integumentary (Hair, Skin) Wound #8 status is Open. Original cause of wound was Gradually Appeared. The wound is located on the Right,Proximal,Medial Lower Leg. The wound measures 0.1cm length x 0.1cm width x 0.1cm depth; 0.008cm^2 area and 0.001cm^3 volume. There is Fat Layer (Subcutaneous Tissue) Exposed exposed. There is no tunneling or undermining noted.  There is a small amount of serous drainage noted. The wound margin is flat and intact. There is large (67-100%) red, pink granulation within the wound bed. There is a small (1-33%) amount of necrotic tissue within the wound bed including Adherent Slough. The periwound skin appearance exhibited: Scarring, Hemosiderin Staining. The periwound skin appearance did not exhibit: Callus, Crepitus, Excoriation, Induration, Rash, Dry/Scaly, Maceration, Atrophie Blanche, Cyanosis, Ecchymosis, Mottled, Pallor, Rubor, Erythema. Periwound temperature was noted as No Abnormality. The periwound has tenderness on palpation. 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 There was a Three Layer Compression Therapy Procedure with a pre-treatment ABI of 1.2 by Mark Hora, RN. Post procedure Diagnosis Wound #8: Same as Pre-Procedure Plan Ganesh, Dorsie ([id number]) Wound Cleansing: Wound #8 Right,Proximal,Medial Lower Leg: Clean wound with Normal Saline. Cleanse wound with mild soap and water Anesthetic (add to Medication List): Wound #8 Right,Proximal,Medial Lower Leg: Topical Lidocaine 4% cream applied to wound bed prior to debridement (In Clinic Only). Skin Barriers/Peri-Wound Care: Wound #8 Right,Proximal,Medial Lower Leg: Moisturizing lotion - not on wound area Primary Wound Dressing: Xeroform Secondary Dressing: Wound #8 Right,Proximal,Medial Lower Leg: Dry Gauze Non-adherent pad Dressing Change Frequency: Wound #8 Right,Proximal,Medial Lower Leg: Change dressing every week Follow-up Appointments:  Wound #8 Right,Proximal,Medial Lower  Leg: Return Appointment in 1 week. Edema Control: Wound #8 Right,Proximal,Medial Lower Leg: 3 Layer Compression System - Right Lower Extremity - unna to anchor Additional Orders / Instructions: Wound #8 Right,Proximal,Medial Lower Leg: Increase protein intake. My suggestion at this point is gonna be that we go ahead and continue with the compression wrap for one more week. We will continue with your form as well since this seems to be doing well. We will subsequently see were things stand at follow-up. Please see above for specific wound care orders. We will see patient for re-evaluation in 1 week(s) here in the clinic. If anything worsens or changes patient will contact our office for additional recommendations. Electronic Signature(s) Signed: 06/15/2018 1:01:02 AM By: Mark Keeler PA-C Entered By: [name] on 06/13/2018 09:08:37 Keithly, [name] ([id number]) -------------------------------------------------------------------------------- ROS/PFSH Details Patient Name: [name] Date of Service: 06/13/2018 8:15 AM Medical Record Number: [id number] Patient Account Number: [id number] Date of Birth/Sex: [birth year] (67 y.o. M) Treating RN: [name] Primary Care Provider: Royetta [name] Other Clinician: Referring Provider: Royetta [name] Treating Provider/Extender: [name], Nashon Erbes [name] in Treatment: 39 Information Obtained From Patient Wound History Do you currently have one or more open woundso Yes How many open wounds do you currently haveo 1 Approximately how long have you had your woundso 1 week How have you been treating your wound(s) until nowo lotion Has your wound(s) ever healed and then re-openedo Yes Have you had any lab work done in the past montho No Have you tested positive for an antibiotic resistant organism (MRSA, VRE)o No Have you tested positive for osteomyelitis (bone infection)o No Have you had any tests for circulation on your legso  Yes Who ordered the testo [facility] Where was the test doneo [facility] Constitutional Symptoms (General Health) Complaints and Symptoms: Negative for: Fever; Chills Cardiovascular Complaints and Symptoms: Positive for: LE edema Medical History: Positive for: Arrhythmia - a fib; Deep Vein Thrombosis; Hypertension Negative for: Angina; Congestive Heart Failure; Coronary Artery Disease; Hypotension; Myocardial Infarction; Peripheral Arterial Disease; Peripheral Venous Disease; Phlebitis; Vasculitis Eyes Medical History: Negative for: Cataracts; Glaucoma; Optic Neuritis Ear/Nose/Mouth/Throat Medical History: Negative for: Chronic sinus problems/congestion; Middle ear problems Past Medical History Notes: difficulty speaking Hematologic/Lymphatic Medical History: Negative for: Anemia; Hemophilia; Human Immunodeficiency Virus; Lymphedema; Sickle Cell Disease Respiratory Pinela, Drevion ([id number]) Complaints and Symptoms: No Complaints or Symptoms Medical History: Positive for: Chronic Obstructive Pulmonary Disease (COPD) Negative for: Aspiration; Asthma; Pneumothorax; Sleep Apnea; Tuberculosis Past Medical History Notes: Hospitalized for Pneumonia 02/2017 Gastrointestinal Medical History: Negative for: Cirrhosis ; Colitis; Crohnos; Hepatitis A; Hepatitis B; Hepatitis C Endocrine Medical History: Positive for: Type II Diabetes Negative for: Type I Diabetes Time with diabetes: since 2017 Treated with: Diet Blood sugar tested every day: No Genitourinary Medical History: Negative for: End Stage Renal Disease Immunological Medical History: Negative for: Lupus Erythematosus; Raynaudos; Scleroderma Integumentary (Skin) Medical History: Negative for: History of Burn; History of pressure wounds Musculoskeletal Medical History: Positive for: Gout Negative for: Rheumatoid Arthritis; Osteoarthritis; Osteomyelitis Neurologic Medical History: Positive for: Neuropathy Negative  for: Dementia; Quadriplegia; Paraplegia; Seizure Disorder Past Medical History Notes: CVA in 2014 Oncologic Medical History: Negative for: Received Chemotherapy; Received Radiation Psychiatric REYN, FAIVRE ([id number]) Complaints and Symptoms: No Complaints or Symptoms Medical History: Negative for: Anorexia/bulimia; Confinement Anxiety Immunizations Pneumococcal Vaccine: Received Pneumococcal Vaccination: Yes Immunization Notes: up to date Implantable Devices Hospitalization / Surgery History Name of Hospital Purpose of Hospitalization/[place] Date [facility] [place] CVA 05/17/2012 [facility] Pneumonia 03/17/2017 Family and  Social History Cancer: No; Diabetes: No; Heart Disease: Yes - Mother,Father; Hereditary Spherocytosis: No; Hypertension: Yes - Mother,Father; Stroke: Yes - Father; Thyroid Problems: No; Tuberculosis: No; Current every day smoker; Marital Status - Single; Alcohol Use: Never; Drug Use: No History; Caffeine Use: Daily; Financial Concerns: No; Food, Clothing or Shelter Needs: No; Support System Lacking: No; Transportation Concerns: No; Advanced Directives: No; Patient does not want information on Advanced Directives Physician Affirmation I have reviewed and agree with the above information. Electronic Signature(s) Signed: 06/13/2018 4:47:13 PM By: [name] Signed: 06/15/2018 1:01:02 AM By: Mark Keeler PA-C Entered By: [name] on 06/13/2018 09:08:02 Lucks, [name] ([id number]) -------------------------------------------------------------------------------- SuperBill Details Patient Name: [name] Date of Service: 06/13/2018 Medical Record Number: [id number] Patient Account Number: [id number] Date of Birth/Sex: June 11, 1950 (67 y.o. M) Treating RN: [name] Primary Care Provider: Royetta [name] Other Clinician: Referring Provider: Royetta [name] Treating Provider/Extender: [name], Jenene Kauffmann [name] in Treatment: 33 Diagnosis Coding ICD-10  Codes Code Description I87.2 Venous insufficiency (chronic) (peripheral) I89.0 Lymphedema, not elsewhere classified E11.622 Type 2 diabetes mellitus with other skin ulcer L97.812 Non-pressure chronic ulcer of other part of right lower leg with fat layer exposed Physician Procedures CPT4 Code Description: [id number] - WC PHYS LEVEL 4 - EST PT ICD-10 Diagnosis Description I87.2 Venous insufficiency (chronic) (peripheral) I89.0 Lymphedema, not elsewhere classified E11.622 Type 2 diabetes mellitus with other skin ulcer L97.812  Non-pressure chronic ulcer of other part of right lower leg wi Modifier: th fat layer expo Quantity: 1 sed Electronic Signature(s) Signed: 06/15/2018 1:01:02 AM By: Mark Keeler PA-C Entered By: [name] on 06/13/2018 09:08:54

## 2018-06-20 ENCOUNTER — Encounter: Payer: Medicare Other | Attending: Physician Assistant | Admitting: Physician Assistant

## 2018-06-20 DIAGNOSIS — Z8673 Personal history of transient ischemic attack (TIA), and cerebral infarction without residual deficits: Secondary | ICD-10-CM | POA: Insufficient documentation

## 2018-06-20 DIAGNOSIS — J449 Chronic obstructive pulmonary disease, unspecified: Secondary | ICD-10-CM | POA: Insufficient documentation

## 2018-06-20 DIAGNOSIS — I10 Essential (primary) hypertension: Secondary | ICD-10-CM | POA: Insufficient documentation

## 2018-06-20 DIAGNOSIS — Z7901 Long term (current) use of anticoagulants: Secondary | ICD-10-CM | POA: Insufficient documentation

## 2018-06-20 DIAGNOSIS — Z8249 Family history of ischemic heart disease and other diseases of the circulatory system: Secondary | ICD-10-CM | POA: Insufficient documentation

## 2018-06-20 DIAGNOSIS — Z09 Encounter for follow-up examination after completed treatment for conditions other than malignant neoplasm: Secondary | ICD-10-CM | POA: Insufficient documentation

## 2018-06-20 DIAGNOSIS — I890 Lymphedema, not elsewhere classified: Secondary | ICD-10-CM | POA: Insufficient documentation

## 2018-06-20 DIAGNOSIS — Z86718 Personal history of other venous thrombosis and embolism: Secondary | ICD-10-CM | POA: Insufficient documentation

## 2018-06-20 DIAGNOSIS — Z86711 Personal history of pulmonary embolism: Secondary | ICD-10-CM | POA: Insufficient documentation

## 2018-06-20 DIAGNOSIS — I4891 Unspecified atrial fibrillation: Secondary | ICD-10-CM | POA: Insufficient documentation

## 2018-06-20 DIAGNOSIS — F17210 Nicotine dependence, cigarettes, uncomplicated: Secondary | ICD-10-CM | POA: Insufficient documentation

## 2018-06-20 DIAGNOSIS — E1140 Type 2 diabetes mellitus with diabetic neuropathy, unspecified: Secondary | ICD-10-CM | POA: Insufficient documentation

## 2018-06-20 DIAGNOSIS — I872 Venous insufficiency (chronic) (peripheral): Secondary | ICD-10-CM | POA: Insufficient documentation

## 2018-06-21 NOTE — Progress Notes (Signed)
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 follow-up with Dr. dew about the recurrent wounds on the right leg that are painful both an arterial and venous assessment. Will also try to  set up an appointment with his primary physician. 07/07/16; The patient has been to see Dr. Lucky Cowboy although we don't have notes. Also been to see primary MD and has new "pills". States he feels better. Using [facility] 07/14/16; the patient is been to see Dr. dew. I think he had further arterial studies that showed triphasic waveforms bilaterally. They also note subocclusive DVT and right posterior tibial and anterior tibial arteries not visualized due to wound bandages which they unfortunately did not take off. Right lower extremity small vessel disease cannot be excluded due to limited visualization. There is of note that they want to follow-up with vascular lab study on 08/23/16. 3/7/ 18; patient comes in today with the wound bed in fairly good condition. No debridement. TheraSkin #1 08/04/16 no major change in wound dimensions although the base of this looks fairly healthy. No debridement TheraSkin #2 08/17/16- the patient is here for follow-up of a attenuation of his right lower Schmeltzer. He is status post 2 TheraSkin applications and he states he has an appointment for venous ablation with Dr.Dew on 4/13. 08/31/16; the patient had laser ablation by Dr. dew on 4/13. I think this involved both the greater and lesser saphenous veins. He tolerated this well. We have been putting TheraSkin on the wound every 2 weeks and he arrives with better-looking epithelialization today 09/14/16; the patient arrives today with an odor to his wound and some greenish necrotic surface over the wound approximately 70%. He had a small satellite lesion noted last week when we changed his dressing in between application of TheraSkin. I elected not to put that TheraSkin on today. 09/21/16; deterioration in the wound last week. I gave him empiric Cefdinir out of fear for a gram-negative infection although the CULTURE turned out to be negative. He completed his antibiotics this morning. Wound looks somewhat better, I put  silver alginate on it last week again out of concern for infection. We do not have a TheraSkin this week not ordered last week 09/28/16; no major change from last week. We've looked over the volume of this wound and of not had major changes in spite of TheraSkin although the last TheraSkin was almost a month ago. We put silver alginate on last week out of fear of infection. I will switch to [facility] as looking over the records didn't really suggest that theraskin had helped 10/12/16; we'll use Hydrofera Blue starting last week. No major change in the wound dimensions. 10/19/16; continue Hydrofera Blue. 1.8 x 2.5 x 0.2. Wound base looks healthy 10/26/16; continue with Hydrofera Blue. 1.5 x 2.4 x 0.2 11/02/16 no change in dimensions. Change from [facility] to Iodoflex 11/09/16; patient complains of increasing pain. Dimensions slightly larger. Last week I put Iodoflex on this wound to see if we  can get a better surface I also increased him to 4 layer compression. He has not been systemically unwell. 11/16/16; patient states his leg feels better. He has completed his antibiotics. Dimensions are better. I change back to [facility] last week. We also change the dressing once on Friday which may have helped. Wound looks a lot better today than last week.. 2.2 x 2.4 x 0.3 11/23/16 on evaluation today patient's right lower extremity ulcer appears to be doing very well. He has been tolerating the [facility] Dressing and tells me that fortunately he is finally improvement. He is pleased with how this is progressing. [name], [name] ([id number]) 11/30/16; improved using Hydrofera Blue 12/07/16; continue dramatic progress. Wound is now small and healthy looking. Continue use of Hydrofera Blue 12/14/16; very small wound albeit with some depth. Continued use of Hydrofera blue. 12/21/16 on evaluation today patient's right lower extremity ulcer appears healed at this point. He is having no discomfort  and overall this is doing very well. And there's no sign of infection. 04/26/17; READMISSION Patient we know from previous stays in this clinic. The patient has known diabetic PAD and has a stent in the right leg. 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 When he was last in the clinic in August we had healed him out for what was felt to be a mostly venous insufficiency wound on the medial right leg. He follows with vascular surgery Dr. dew is at [place] [facility]. He has previously undergone successful laser ablation. Reflux studies on 4/24 showed reflux in the common femoral, femoral and popliteal. He underwent successful ablation of the right greater saphenous vein from the distal thigh to the mid calf level. Successful ablation of the right small saphenous vein. He had unsuccessful ablation of the right greater saphenous vein from the saphenofemoral junction to the distal 5 level. The patient wears to let her compression stockings and he has been compliant with this With regards to his diabetes he is not currently on any treatment. He does describe pain in his leg which forces him to stop although I couldn't really quantify this. His last arterial studies were on 07/05/16 which showed triphasic waveforms on the right to the level of the popliteal artery. The right posterior tibial and anterior tibial artery were not visualized on this study. I don't actually see ABIs or TBIs. 05/04/17; the patient has small wounds on the right medial heel and the right medial leg. The area on the foot is just about closed. The area on the right medial leg is improved. He is tolerating the 3 layer compression without any complaints. The patient has both known PAD and chronic venous reflux [see discussion above]. 05/11/17; the areas on the patient's medial right heel with healed he still has an open area on the right medial leg. This does not require debridement today I  think I did read it last week. We are using silver collagen under 3 layer compression. 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 He has been tolerating the dressing changes without complication. With that being said he does have some epithelialization growing into the wound bed and it appears this wound may be trying to healed in a depressed fashion. To be honest that is not the worst case scenario and I think  this may definitely do fine even if it heals in that way. There was no requirement for debridement today as patient appears to be doing very well in regard to his ulcer. His swelling was a little bit more severe than last week but nothing too significant. 07/05/17 on evaluation today patient appears to be doing rather well in regard to his right medial lower extremity ulcer. The wound does appear to be healing although it is healing and somewhat of a cratered fashion the good news is he has excellent epithelium noted. Fortunately there does not appear to be any evidence of infection at this point he is tolerating the dressings as well is the wrap well. 07/12/17 on evaluation today patient appears to be doing very well in regard to his left medial lower extremity ulcer. He has been tolerating the dressing changes without complication. In fact compared to last week this wound which was somewhat concave has fielding dramatically and looks to be doing excellent. I'm extremely happy with the progress. He still has pain rated to be a 4-5/10 but the wound looks great. 07/20/17; the patient's right medial lower extremity ulcer is closed. He has good edema control. Unfortunately he does not really have adequate compression stockings. Certainly what he brought in the blood on his right leg might of been support hose however there is whole  in the stocking already Readmission: 10/25/17 patient seen today for reevaluation due to a recurrence of the right medial ankle venous leg ulcer. 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 He has been treated with compression stockings for some time now in fact we have notes having treated him even as far back as 2016 here in our clinic. Fortunately he has excellent blood flow and tends to heal well. Unfortunately despite wearing his compression stockings, keeping his legs elevated, being on fluid pills (Lasix) and doing everything he can to try to help with fluid he still continues to have issues with this reopening. I think that he may be a good candidate for compression/lymphedema pumps. We have never attempted to get these for him previously. No fevers, chills, nausea, or vomiting noted at this time. Currently patient states his ulcer does hurt although not as bad as some in the past it's also not as large as it has been in the past when it reopened he talked is somewhat early. Obviously this is good news. [name], [name] ([id number]) 12/13/17 on evaluation today patient appears to actually be doing very well in regard to his right medial ankle ulcer compared to last week's evaluation. I think having him on the correct antibiotics has made all the difference in the world. With that being said he's not having discomfort like he was previous and in general I feel like he has made excellent strides towards healing in the past week. No fevers, chills, nausea, or vomiting noted at this time. 01/10/18 on evaluation today patient appears to be doing a little worse compared to last week's evaluation. I think that though the Iodoflex has help with cleaning up the surface of the wound subsequently he has having a lot of drainage which I think is causing some issues in that regard. At this point I think we may want to switch the  dressings. 01/17/18 on evaluation today patient appears to be doing rather well in regard to his right medial malleolus ulcer. The good news is I do feel like he showing some signs  of improvement which is excellent. I feel like that the [facility] Dressing been better for him than the Iodoflex that we will previously utilizing. He is having much less in the way of discomfort which is good news. Overall very pleased in that regard. Nonetheless the appearance of the wound is improved today as well and that is excellent news. He still has pain but not seemingly as much as previous and I don't feel like there's much maceration either. 01/23/18 on evaluation today patient appears in our clinic a day early for his appointment due to the fact that he was having discomfort and issues with his wrap. Upon inspection of the wound actually appears to be doing fairly well which is good news. With that being said he is having a lot of discomfort at this point unfortunately. I think that this is mainly due to the fact that he is having such drainage as the wound bed itself seems to be doing well. I do believe the [facility] Dressing well is helpful for him which is good news. Nonetheless again I think he may need more frequent dressing changes. 01/31/18 on evaluation today patient actually appears to be doing very well in regard to his right medial ankle ulcer. This is significantly better than what was even noted last week I do think that the extra dressing change with the nurse visit toward the end of the week made a significant improvement for him. Overall I'm very pleased with how things appear today. 02/07/18 on evaluation today patient actually appears to be doing a little better in regard to his wound. The measurements are smaller today compared to previous. Fortunately there's no evidence of any infection and I do believe the moisture is much better control with more frequent wrap changes. Overall the  patient seems to be making good progress. 02/14/18 on evaluation today patient actually appears to be doing very well in regard to his medial ankle ulcer of the right lower extremity. With that being said he saw slough buildup and this is still been somewhat slow to heal. For that reason my suggestion currently is gonna be that we may need to see about checking on a skin substitute possibly Apligraf to see if this will be of benefit for him. In the past he has benefited from Apligraf. Overall there does not appear to be any evidence of infection at this time which is definitely good news. 02/28/18 on evaluation today patient's medial ankle ulcer on the right actually appears to be doing significantly better compared to previous. He has been tolerating the dressing changes without complication. Fortunately there appears to be no evidence of infection which is great news. Overall I do feel like that he has made improvement although there has been some delay in general and the wound is taken a little longer than I would've liked up to this point. Nonetheless there's no sign of infection and therefore I think that we can proceed with the Apligraf applications we actually got this approved for him. That something that I'm gonna see about ordering for next week. 03/07/18 on evaluation today patient unfortunately appears to be doing significantly worse than normal as far as his overall and general appearance is concerned. He has been tolerating the dressing changes without complication. With that being said nothing appears to be problematic in regard to his wound. The issue that I see right now involves more his general malaise and the fact that he does appear to have atrial fibrillation  which is a known issue but unfortunately he is dropping low to the point of having 3-5 seconds of sustained pulses that are around 30 bpm. This was confirmed actually during evaluation today by myself. He's been having left  lower quadrant pain and was actually in the emergency department on 17 October most recently. According to the notes reviewed it did not appear that anything was identified as far as a cause of the reason for this. It does not appear to the wound related which is good news. Nonetheless still I'm unsure of exactly what is going on but I am worried on top of everything else and the patient feeling lethargic he showed up late for his appointment today which is very uncharacteristic for this gentleman. 03/15/18; the patient was sent to the ER last week during his evaluation but he was not admitted. I have not reviewed the emergency room record. He states he feels better. He is here for application of Apligraf #1. The Apligraf we had would've expired tomorrow so he was brought in early 03/28/18 on evaluation today patient actually appears to be doing well in my pinion in regard to the overall appearance of his right lower Trinity ulcer. He has been tolerating the dressing changes without complication. Fortunately there does not appear to be any evidence of infection at this time which is also good news. He does seem to have tolerated the first Apligraf [name], [name] ([id number]) application. He is here for number two today. 04/11/18 upon evaluation today patient actually appears to be doing very well in regard to his right medial malleolus ulcer. He has been tolerating the dressing changes specifically the compression wrap without complication. The Apligraf does seem to be beneficial for him he showing signs of good improvement which is excellent news. Overall I'm very pleased even though the measurements have not dramatically improve the appearance of the wound has. 04/25/18 on evaluation today patient actually appears to be doing much better in regard to his right medial ankle ulcer. He has been tolerating the dressing changes without complication specifically the Apligraf and the compression  wrap. At this point I think he has made great progress even since last week. Fortunately there does not appear to be any evidence of infection at this time. 05/09/18 on evaluation today patient appears to be doing very well in regard to his lower Trinity ulcer. In fact this has made great progress since last time I seen him there's just one small area remaining this is at the top where it was worse. Fortunately there is no sign of infection and I do believe he's making great progress. This is his last application of the Apligraf today. 05/23/18 on evaluation today patient's wound actually appears to be showing signs of good improvement. He has completed his series of Apligraf at this point. All in all I do believe this was of great benefit for him and were very close to seeing this completely resolved. 05/30/18 upon evaluation today patient actually appears to be doing fairly well in regard to his lower extremity ulcer. He has been tolerating the compression wrap without complication. With that being said it does appear at this point that there still a small opening at the proximal portion where the entire wound was although there's good epithelialization noted here as well to some Slough at this point. Nonetheless I did perform sharp debridement to remove some of the slough today. 06/06/18 on evaluation today patient appears to actually be doing very well in  regard to his lower extremity ulcer. He has been tolerating the dressing changes without complication. Fortunately there does not appear to be any evidence of infection at this time which is good news. Overall I'm very pleased with the progress that he seems to be making. 06/13/18 on evaluation today patient's wound appears to be almost completely healed. He is new skin growing over the surface of the wound which is great news. Fortunately he's been tolerating the dressing changes without complication. Overall been very pleased with how things  have progressed in fact today this appears to be doing excellent. Overall the patient is also pleased no need for sharp debridement today which is actually the best news he's heard in quite some time. 06/20/18 on evaluation today patient appears to be doing very well and is very close to complete resolution. With that being said he still has some discomfort at the site and there is still a small opening and a wound underlying this once debridement was performed. With that being said he does seem to be making good progress it's just taking longer here at the end than what we would've liked obviously. Patient History Information obtained from Patient. Family History Heart Disease - Mother,Father, Hypertension - Mother,Father, Stroke - Father, No family history of Cancer, Diabetes, Hereditary Spherocytosis, Thyroid Problems, Tuberculosis. Social History Current every day smoker, Marital Status - Single, Alcohol Use - Never, Drug Use - No History, Caffeine Use - Daily. Medical History Eyes Denies history of Cataracts, Glaucoma, Optic Neuritis Ear/Nose/Mouth/Throat Denies history of Chronic sinus problems/congestion, Middle ear problems Hematologic/Lymphatic Denies history of Anemia, Hemophilia, Human Immunodeficiency Virus, Lymphedema, Sickle Cell Disease Respiratory Patient has history of Chronic Obstructive Pulmonary Disease (COPD) Allcorn, Trevonte ([id number]) Denies history of Aspiration, Asthma, Pneumothorax, Sleep Apnea, Tuberculosis Cardiovascular Patient has history of Arrhythmia - a fib, Deep Vein Thrombosis, Hypertension Denies history of Angina, Congestive Heart Failure, Coronary Artery Disease, Hypotension, Myocardial Infarction, Peripheral Arterial Disease, Peripheral Venous Disease, Phlebitis, Vasculitis Gastrointestinal Denies history of Cirrhosis , Colitis, Crohn s, Hepatitis A, Hepatitis B, Hepatitis C Endocrine Patient has history of Type II Diabetes Denies history of  Type I Diabetes Genitourinary Denies history of End Stage Renal Disease Immunological Denies history of Lupus Erythematosus, Raynaud s, Scleroderma Integumentary (Skin) Denies history of History of Burn, History of pressure wounds Musculoskeletal Patient has history of Gout Denies history of Rheumatoid Arthritis, Osteoarthritis, Osteomyelitis Neurologic Patient has history of Neuropathy Denies history of Dementia, Quadriplegia, Paraplegia, Seizure Disorder Oncologic Denies history of Received Chemotherapy, Received Radiation Psychiatric Denies history of Anorexia/bulimia, Confinement Anxiety Hospitalization/Surgery History - 05/17/2012, [facility], CVA. - 03/17/2017, [facility], Pneumonia. Medical And Surgical History Notes Ear/Nose/Mouth/Throat difficulty speaking Respiratory Hospitalized for Pneumonia 02/2017 Neurologic CVA in 2014 Review of Systems (ROS) Constitutional Symptoms (General Health) Denies complaints or symptoms of Fever, Chills. Respiratory The patient has no complaints or symptoms. Cardiovascular The patient has no complaints or symptoms. Psychiatric The patient has no complaints or symptoms. Objective Constitutional Well-nourished and well-hydrated in no acute distress. [name], [name] ([id number]) Vitals Time Taken: 8:11 AM, Height: 74 in, Weight: 220 lbs, BMI: 28.2, Temperature: 97.8 F, Pulse: 51 bpm, Respiratory Rate: 16 breaths/min, Blood Pressure: 120/70 mmHg. Respiratory normal breathing without difficulty. Psychiatric this patient is able to make decisions and demonstrates good insight into disease process. Alert and Oriented x 3. pleasant and cooperative. General Notes: Patient's wound bed currently did require sharp debridement and post debridement the wound appears to be doing significantly better which is excellent news. There is no  evidence of infection at this time which is also good news. Integumentary (Hair, Skin) Wound #8 status is Open.  Original cause of wound was Gradually Appeared. The wound is located on the Right,Proximal,Medial Lower Leg. The wound measures 0.1cm length x 0.1cm width x 0.1cm depth; 0.008cm^2 area and 0.001cm^3 volume. There is Fat Layer (Subcutaneous Tissue) Exposed exposed. There is no tunneling or undermining noted. There is a small amount of serous drainage noted. The wound margin is flat and intact. There is large (67-100%) red, pink granulation within the wound bed. There is a small (1-33%) amount of necrotic tissue within the wound bed including Adherent Slough. The periwound skin appearance exhibited: Scarring, Hemosiderin Staining. The periwound skin appearance did not exhibit: Callus, Crepitus, Excoriation, Induration, Rash, Dry/Scaly, Maceration, Atrophie Blanche, Cyanosis, Ecchymosis, Mottled, Pallor, Rubor, Erythema. Periwound temperature was noted as No Abnormality. The periwound has tenderness on palpation. 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 There was a Excisional Skin/Subcutaneous Tissue Debridement with a total area of 0.01 sq cm performed by STONE III, HOYT E., PA-C. With the following instrument(s): Curette to remove Non-Viable tissue/material. Material removed includes Eschar, Subcutaneous Tissue, and Slough after achieving pain control using Lidocaine 4% Topical Solution. No specimens were taken. A time out was conducted at 08:33, prior to the start of the procedure. There was no bleeding. The procedure was tolerated well with a pain level of 0 throughout and a pain  level of 0 following the procedure. Post Debridement Measurements: 0.3cm length x 0.3cm width x 0.1cm depth; 0.007cm^3 volume. Character of Wound/Ulcer Post Debridement is improved. Severity of Tissue Post Debridement is: Fat layer exposed. Post procedure Diagnosis Wound #8: Same as Pre-Procedure Costin, [name] ([id number]) Plan Wound Cleansing: Wound #8 Right,Proximal,Medial Lower Leg: Clean wound with Normal Saline. Cleanse wound with mild soap and water Anesthetic (add to Medication List): Wound #8 Right,Proximal,Medial Lower Leg: Topical Lidocaine 4% cream applied to wound bed prior to debridement (In Clinic Only). Skin Barriers/Peri-Wound Care: Wound #8 Right,Proximal,Medial Lower Leg: Moisturizing lotion - not on wound area Primary Wound Dressing: Xeroform Secondary Dressing: Wound #8 Right,Proximal,Medial Lower Leg: Non-adherent pad Dressing Change Frequency: Wound #8 Right,Proximal,Medial Lower Leg: Change dressing every week Follow-up Appointments: Wound #8 Right,Proximal,Medial Lower Leg: Return Appointment in 1 week. Edema Control: Wound #8 Right,Proximal,Medial Lower Leg: 3 Layer Compression System - Right Lower Extremity - unna to anchor Additional Orders / Instructions: Wound #8 Right,Proximal,Medial Lower Leg: Increase protein intake. My suggestion currently is gonna be that we continue with the above wound care measures. The patient is in agreement with plan. We will subsequently see were things stand at follow-up. We will also check on his Velcro compression wrap we've ordered to see were things stand in that regard. Please see above for specific wound care orders. We will see patient for re-evaluation in 1 week(s) here in the clinic. If anything worsens or changes patient will contact our office for additional recommendations. Electronic Signature(s) Signed: 06/20/2018 5:55:30 PM By: Worthy Keeler PA-C Entered By: Worthy Keeler on 06/20/2018  08:40:53 Erber, [name] ([id number]) -------------------------------------------------------------------------------- ROS/PFSH Details Patient Name: [name] Date of Service: 06/20/2018 8:00 AM Medical Record Number:  [id number] Patient Account Number: [id number] Date of Birth/Sex: 1951/02/02 (68 y.o. M) Treating RN: Montey Hora Primary Care Provider: Royetta Crochet Other Clinician: Referring Provider: Royetta Crochet Treating Provider/Extender: Melburn Hake, HOYT Weeks in Treatment: 34 Information Obtained From Patient Wound History Do you currently have one or more open woundso Yes How many open wounds do you currently haveo 1 Approximately how long have you had your woundso 1 week How have you been treating your wound(s) until nowo lotion Has your wound(s) ever healed and then re-openedo Yes Have you had any lab work done in the past montho No Have you tested positive for an antibiotic resistant organism (MRSA, VRE)o No Have you tested positive for osteomyelitis (bone infection)o No Have you had any tests for circulation on your legso Yes Who ordered the testo [facility] Where was the test doneo [facility] Constitutional Symptoms (General Health) Complaints and Symptoms: Negative for: Fever; Chills Eyes Medical History: Negative for: Cataracts; Glaucoma; Optic Neuritis Ear/Nose/Mouth/Throat Medical History: Negative for: Chronic sinus problems/congestion; Middle ear problems Past Medical History Notes: difficulty speaking Hematologic/Lymphatic Medical History: Negative for: Anemia; Hemophilia; Human Immunodeficiency Virus; Lymphedema; Sickle Cell Disease Respiratory Complaints and Symptoms: No Complaints or Symptoms Medical History: Positive for: Chronic Obstructive Pulmonary Disease (COPD) Negative for: Aspiration; Asthma; Pneumothorax; Sleep Apnea; Tuberculosis Past Medical History Notes: Hospitalized for Pneumonia 02/2017 OSWELL, SAY  ([id number]) Cardiovascular Complaints and Symptoms: No Complaints or Symptoms Medical History: Positive for: Arrhythmia - a fib; Deep Vein Thrombosis; Hypertension Negative for: Angina; Congestive Heart Failure; Coronary Artery Disease; Hypotension; Myocardial Infarction; Peripheral Arterial Disease; Peripheral Venous Disease; Phlebitis; Vasculitis Gastrointestinal Medical History: Negative for: Cirrhosis ; Colitis; Crohnos; Hepatitis A; Hepatitis B; Hepatitis C Endocrine Medical History: Positive for: Type II Diabetes Negative for: Type I Diabetes Time with diabetes: since 2017 Treated with: Diet Blood sugar tested every day: No Genitourinary Medical History: Negative for: End Stage Renal Disease Immunological Medical History: Negative for: Lupus Erythematosus; Raynaudos; Scleroderma Integumentary (Skin) Medical History: Negative for: History of Burn; History of pressure wounds Musculoskeletal Medical History: Positive for: Gout Negative for: Rheumatoid Arthritis; Osteoarthritis; Osteomyelitis Neurologic Medical History: Positive for: Neuropathy Negative for: Dementia; Quadriplegia; Paraplegia; Seizure Disorder Past Medical History Notes: CVA in 2014 Oncologic Medical History: Negative for: Received Chemotherapy; Received Radiation Psychiatric KANAN, SOBEK ([id number]) Complaints and Symptoms: No Complaints or Symptoms Medical History: Negative for: Anorexia/bulimia; Confinement Anxiety Immunizations Pneumococcal Vaccine: Received Pneumococcal Vaccination: Yes Immunization Notes: up to date Implantable Devices Hospitalization / Surgery History Name of Hospital Purpose of Hospitalization/[place] Date [place] CVA 05/17/2012 [facility] Pneumonia 03/17/2017 Family and Social History Cancer: No; Diabetes: No; Heart Disease: Yes - Mother,Father; Hereditary Spherocytosis: No; Hypertension: Yes - Mother,Father; Stroke: Yes - Father; Thyroid Problems: No; Tuberculosis: No;  Current every day smoker; Marital Status - Single; Alcohol Use: Never; Drug Use: No History; Caffeine Use: Daily; Financial Concerns: No; Food, Clothing or Shelter Needs: No; Support System Lacking: No; Transportation Concerns: No; Advanced Directives: No; Patient does not want information on Advanced Directives Physician Affirmation I have reviewed and agree with the above information. Electronic Signature(s) Signed: 06/20/2018 4:52:45 PM By: Montey Hora Signed: 06/20/2018 5:55:30 PM By: Worthy Keeler PA-C Entered By: Worthy Keeler on 06/20/2018 08:39:59 Buford, [name] ([id number]) -------------------------------------------------------------------------------- SuperBill Details Patient Name: [name] Date of Service: 06/20/2018 Medical Record Number: [id number] Patient Account Number: [id number] Date of Birth/Sex: Oct 04, 1950 (67 y.o. M) Treating RN: Montey Hora Primary Care Provider: Royetta Crochet Other Clinician: Referring Provider: Royetta Crochet Treating Provider/Extender: STONE III, HOYT Weeks in  Treatment: 34 Diagnosis Coding ICD-10 Codes Code Description I87.2 Venous insufficiency (chronic) (peripheral) I89.0 Lymphedema, not elsewhere classified E11.622 Type 2 diabetes mellitus with other skin ulcer L97.812 Non-pressure chronic ulcer of other part of right lower leg with fat layer exposed Facility Procedures CPT4 Code Description: [id number] 11042 - DEB SUBQ TISSUE 20 SQ CM/< ICD-10 Diagnosis Description L37.342 Non-pressure chronic ulcer of other part of right lower leg wit Modifier: h fat layer expo Quantity: 1 sed Physician Procedures CPT4 Code Description: [id number] 11042 - WC PHYS SUBQ TISS 20 SQ CM ICD-10 Diagnosis Description B26.203 Non-pressure chronic ulcer of other part of right lower leg wit Modifier: h fat layer expo Quantity: 1 sed Electronic Signature(s) Signed: 06/20/2018 5:55:30 PM By: Worthy Keeler PA-C Entered By: Worthy Keeler on  06/20/2018 08:41:03

## 2018-06-22 ENCOUNTER — Inpatient Hospital Stay
Admission: EM | Admit: 2018-06-22 | Discharge: 2018-06-24 | DRG: 193 | Disposition: A | Discharge status: Home / Self Care | Payer: Medicare Other | Attending: Internal Medicine | Admitting: Internal Medicine

## 2018-06-22 ENCOUNTER — Other Ambulatory Visit: Payer: Self-pay

## 2018-06-22 ENCOUNTER — Emergency Department: Payer: Medicare Other

## 2018-06-22 ENCOUNTER — Encounter: Payer: Self-pay | Admitting: Emergency Medicine

## 2018-06-22 DIAGNOSIS — Z86718 Personal history of other venous thrombosis and embolism: Secondary | ICD-10-CM

## 2018-06-22 DIAGNOSIS — I10 Essential (primary) hypertension: Secondary | ICD-10-CM | POA: Diagnosis present

## 2018-06-22 DIAGNOSIS — J690 Pneumonitis due to inhalation of food and vomit: Secondary | ICD-10-CM | POA: Diagnosis present

## 2018-06-22 DIAGNOSIS — J9601 Acute respiratory failure with hypoxia: Secondary | ICD-10-CM | POA: Diagnosis present

## 2018-06-22 DIAGNOSIS — J181 Lobar pneumonia, unspecified organism: Secondary | ICD-10-CM

## 2018-06-22 DIAGNOSIS — I69320 Aphasia following cerebral infarction: Secondary | ICD-10-CM

## 2018-06-22 DIAGNOSIS — Z79899 Other long term (current) drug therapy: Secondary | ICD-10-CM

## 2018-06-22 DIAGNOSIS — Z7901 Long term (current) use of anticoagulants: Secondary | ICD-10-CM | POA: Diagnosis not present

## 2018-06-22 DIAGNOSIS — Z791 Long term (current) use of non-steroidal anti-inflammatories (NSAID): Secondary | ICD-10-CM

## 2018-06-22 DIAGNOSIS — Z95828 Presence of other vascular implants and grafts: Secondary | ICD-10-CM | POA: Diagnosis not present

## 2018-06-22 DIAGNOSIS — Z7984 Long term (current) use of oral hypoglycemic drugs: Secondary | ICD-10-CM | POA: Diagnosis not present

## 2018-06-22 DIAGNOSIS — M109 Gout, unspecified: Secondary | ICD-10-CM | POA: Diagnosis present

## 2018-06-22 DIAGNOSIS — R6520 Severe sepsis without septic shock: Secondary | ICD-10-CM

## 2018-06-22 DIAGNOSIS — E119 Type 2 diabetes mellitus without complications: Secondary | ICD-10-CM | POA: Diagnosis present

## 2018-06-22 DIAGNOSIS — Z833 Family history of diabetes mellitus: Secondary | ICD-10-CM | POA: Diagnosis not present

## 2018-06-22 DIAGNOSIS — Z86711 Personal history of pulmonary embolism: Secondary | ICD-10-CM

## 2018-06-22 DIAGNOSIS — R509 Fever, unspecified: Secondary | ICD-10-CM | POA: Diagnosis present

## 2018-06-22 DIAGNOSIS — I482 Chronic atrial fibrillation, unspecified: Secondary | ICD-10-CM | POA: Diagnosis present

## 2018-06-22 DIAGNOSIS — J101 Influenza due to other identified influenza virus with other respiratory manifestations: Secondary | ICD-10-CM

## 2018-06-22 DIAGNOSIS — J1000 Influenza due to other identified influenza virus with unspecified type of pneumonia: Principal | ICD-10-CM | POA: Diagnosis present

## 2018-06-22 DIAGNOSIS — F17210 Nicotine dependence, cigarettes, uncomplicated: Secondary | ICD-10-CM | POA: Diagnosis present

## 2018-06-22 DIAGNOSIS — A419 Sepsis, unspecified organism: Secondary | ICD-10-CM

## 2018-06-22 DIAGNOSIS — J189 Pneumonia, unspecified organism: Secondary | ICD-10-CM | POA: Diagnosis present

## 2018-06-22 LAB — CBC WITH DIFFERENTIAL/PLATELET
ABS IMMATURE GRANULOCYTES: 0.01 10*3/uL (ref 0.00–0.07)
Basophils Absolute: 0 10*3/uL (ref 0.0–0.1)
Basophils Relative: 0 %
Eosinophils Absolute: 0 10*3/uL (ref 0.0–0.5)
Eosinophils Relative: 1 %
HCT: 37.8 % — ABNORMAL LOW (ref 39.0–52.0)
HEMOGLOBIN: 12 g/dL — AB (ref 13.0–17.0)
Immature Granulocytes: 0 %
LYMPHS ABS: 0.4 10*3/uL — AB (ref 0.7–4.0)
LYMPHS PCT: 10 %
MCH: 24.4 pg — AB (ref 26.0–34.0)
MCHC: 31.7 g/dL (ref 30.0–36.0)
MCV: 77 fL — ABNORMAL LOW (ref 80.0–100.0)
MONO ABS: 0.3 10*3/uL (ref 0.1–1.0)
Monocytes Relative: 7 %
NEUTROS ABS: 2.8 10*3/uL (ref 1.7–7.7)
Neutrophils Relative %: 82 %
Platelets: 135 10*3/uL — ABNORMAL LOW (ref 150–400)
RBC: 4.91 MIL/uL (ref 4.22–5.81)
RDW: 17.2 % — ABNORMAL HIGH (ref 11.5–15.5)
WBC: 3.5 10*3/uL — ABNORMAL LOW (ref 4.0–10.5)
nRBC: 0 % (ref 0.0–0.2)

## 2018-06-22 LAB — COMPREHENSIVE METABOLIC PANEL
ALBUMIN: 3.9 g/dL (ref 3.5–5.0)
ALT: 15 U/L (ref 0–44)
AST: 24 U/L (ref 15–41)
Alkaline Phosphatase: 52 U/L (ref 38–126)
Anion gap: 8 (ref 5–15)
BILIRUBIN TOTAL: 1.3 mg/dL — AB (ref 0.3–1.2)
BUN: 20 mg/dL (ref 8–23)
CO2: 26 mmol/L (ref 22–32)
Calcium: 8.9 mg/dL (ref 8.9–10.3)
Chloride: 102 mmol/L (ref 98–111)
Creatinine, Ser: 0.84 mg/dL (ref 0.61–1.24)
GFR calc Af Amer: >60 mL/min (ref >60)
GFR calc non Af Amer: >60 mL/min (ref >60)
GLUCOSE: 100 mg/dL — AB (ref 70–99)
POTASSIUM: 3.7 mmol/L (ref 3.5–5.1)
Sodium: 136 mmol/L (ref 135–145)
Total Protein: 7 g/dL (ref 6.5–8.1)

## 2018-06-22 LAB — URINALYSIS, COMPLETE (UACMP) WITH MICROSCOPIC
BILIRUBIN URINE: NEGATIVE
Bacteria, UA: NONE SEEN
GLUCOSE, UA: NEGATIVE mg/dL
Ketones, ur: NEGATIVE mg/dL
Leukocytes, UA: NEGATIVE
NITRITE: NEGATIVE
PROTEIN: 30 mg/dL — AB
SPECIFIC GRAVITY, URINE: 1.035 — AB (ref 1.005–1.030)
Squamous Epithelial / LPF: NONE SEEN (ref 0–5)
pH: 6 (ref 5.0–8.0)

## 2018-06-22 LAB — LACTIC ACID, PLASMA: Lactic Acid, Venous: 1.1 mmol/L (ref 0.5–1.9)

## 2018-06-22 LAB — INFLUENZA PANEL BY PCR (TYPE A & B)
INFLBPCR: NEGATIVE
Influenza A By PCR: POSITIVE — AB

## 2018-06-22 LAB — LIPASE, BLOOD: Lipase: 19 U/L (ref 11–51)

## 2018-06-22 LAB — GLUCOSE, CAPILLARY
GLUCOSE-CAPILLARY: 83 mg/dL (ref 70–99)
Glucose-Capillary: 96 mg/dL (ref 70–99)

## 2018-06-22 LAB — TROPONIN I: Troponin I: <0.03 ng/mL (ref <0.03)

## 2018-06-22 MED ORDER — OSELTAMIVIR PHOSPHATE 75 MG PO CAPS
75.0000 mg | ORAL_CAPSULE | Freq: Once | ORAL | Status: AC
  Administered 2018-06-22: 75 mg via ORAL
  Filled 2018-06-22: qty 1

## 2018-06-22 MED ORDER — LACTULOSE 10 GM/15ML PO SOLN: 20.0000 g | Freq: Every day | ORAL | Status: DC | PRN

## 2018-06-22 MED ORDER — SODIUM CHLORIDE 0.9 % IV SOLN
500.0000 mg | INTRAVENOUS | Status: DC
  Administered 2018-06-22: 500 mg via INTRAVENOUS
  Filled 2018-06-22 (×2): qty 500

## 2018-06-22 MED ORDER — DOCUSATE SODIUM 100 MG PO CAPS: 100.0000 mg | ORAL_CAPSULE | Freq: Two times a day (BID) | ORAL | Status: DC | PRN

## 2018-06-22 MED ORDER — SIMVASTATIN 20 MG PO TABS
20.0000 mg | ORAL_TABLET | Freq: Every day | ORAL | Status: DC
  Administered 2018-06-22 – 2018-06-23 (×2): 20 mg via ORAL
  Filled 2018-06-22 (×2): qty 1

## 2018-06-22 MED ORDER — FUROSEMIDE 40 MG PO TABS
40.0000 mg | ORAL_TABLET | Freq: Two times a day (BID) | ORAL | Status: DC
  Administered 2018-06-22 – 2018-06-24 (×4): 40 mg via ORAL
  Filled 2018-06-22 (×4): qty 1

## 2018-06-22 MED ORDER — POTASSIUM CHLORIDE CRYS ER 10 MEQ PO TBCR
10.0000 meq | EXTENDED_RELEASE_TABLET | Freq: Every day | ORAL | Status: DC
  Administered 2018-06-23 – 2018-06-24 (×2): 10 meq via ORAL
  Filled 2018-06-22 (×2): qty 1

## 2018-06-22 MED ORDER — SODIUM CHLORIDE 0.9 % IV SOLN
2.0000 g | INTRAVENOUS | Status: DC
  Administered 2018-06-22 – 2018-06-23 (×2): 2 g via INTRAVENOUS
  Filled 2018-06-22 (×2): qty 20
  Filled 2018-06-22: qty 2

## 2018-06-22 MED ORDER — IPRATROPIUM-ALBUTEROL 0.5-2.5 (3) MG/3ML IN SOLN
3.0000 mL | RESPIRATORY_TRACT | Status: DC
  Administered 2018-06-22 – 2018-06-24 (×9): 3 mL via RESPIRATORY_TRACT
  Filled 2018-06-22 (×9): qty 3

## 2018-06-22 MED ORDER — HYDROCHLOROTHIAZIDE 25 MG PO TABS
12.5000 mg | ORAL_TABLET | Freq: Every day | ORAL | Status: DC
  Administered 2018-06-23 – 2018-06-24 (×2): 12.5 mg via ORAL
  Filled 2018-06-22 (×2): qty 0.5

## 2018-06-22 MED ORDER — FERROUS GLUCONATE 324 (38 FE) MG PO TABS
324.0000 mg | ORAL_TABLET | Freq: Every day | ORAL | Status: DC
  Administered 2018-06-23 – 2018-06-24 (×2): 324 mg via ORAL
  Filled 2018-06-22 (×2): qty 1

## 2018-06-22 MED ORDER — ONDANSETRON HCL 4 MG/2ML IJ SOLN
4.0000 mg | Freq: Once | INTRAMUSCULAR | Status: AC
  Administered 2018-06-22: 4 mg via INTRAVENOUS
  Filled 2018-06-22: qty 2

## 2018-06-22 MED ORDER — METOPROLOL TARTRATE 25 MG PO TABS
25.0000 mg | ORAL_TABLET | Freq: Two times a day (BID) | ORAL | Status: DC
  Administered 2018-06-22 – 2018-06-24 (×4): 25 mg via ORAL
  Filled 2018-06-22 (×4): qty 1

## 2018-06-22 MED ORDER — INSULIN ASPART 100 UNIT/ML ~~LOC~~ SOLN
0.0000 [IU] | Freq: Three times a day (TID) | SUBCUTANEOUS | Status: DC
  Administered 2018-06-23: 1 [IU] via SUBCUTANEOUS
  Filled 2018-06-22: qty 1

## 2018-06-22 MED ORDER — OSELTAMIVIR PHOSPHATE 75 MG PO CAPS
75.0000 mg | ORAL_CAPSULE | Freq: Two times a day (BID) | ORAL | Status: DC
  Administered 2018-06-22 – 2018-06-24 (×5): 75 mg via ORAL
  Filled 2018-06-22 (×5): qty 1

## 2018-06-22 MED ORDER — APIXABAN 5 MG PO TABS
5.0000 mg | ORAL_TABLET | Freq: Two times a day (BID) | ORAL | Status: DC
  Administered 2018-06-22 – 2018-06-24 (×4): 5 mg via ORAL
  Filled 2018-06-22 (×4): qty 1

## 2018-06-22 MED ORDER — INSULIN ASPART 100 UNIT/ML ~~LOC~~ SOLN: 0.0000 [IU] | Freq: Every day | SUBCUTANEOUS | Status: DC

## 2018-06-22 MED ORDER — SODIUM CHLORIDE 0.9 % IV BOLUS
1000.0000 mL | Freq: Once | INTRAVENOUS | Status: AC
  Administered 2018-06-22: 1000 mL via INTRAVENOUS

## 2018-06-22 MED ORDER — IOPAMIDOL (ISOVUE-300) INJECTION 61%
100.0000 mL | Freq: Once | INTRAVENOUS | Status: AC | PRN
  Administered 2018-06-22: 100 mL via INTRAVENOUS

## 2018-06-22 MED ORDER — IBUPROFEN 400 MG PO TABS
600.0000 mg | ORAL_TABLET | Freq: Every day | ORAL | Status: DC
  Administered 2018-06-23 – 2018-06-24 (×2): 600 mg via ORAL
  Filled 2018-06-22 (×2): qty 2

## 2018-06-22 MED ORDER — METFORMIN HCL 500 MG PO TABS
500.0000 mg | ORAL_TABLET | Freq: Two times a day (BID) | ORAL | Status: DC
  Administered 2018-06-22 – 2018-06-24 (×4): 500 mg via ORAL
  Filled 2018-06-22 (×5): qty 1

## 2018-06-22 MED ORDER — COLCHICINE 0.6 MG PO TABS
0.6000 mg | ORAL_TABLET | Freq: Every day | ORAL | Status: DC
  Administered 2018-06-23 – 2018-06-24 (×2): 0.6 mg via ORAL
  Filled 2018-06-22 (×2): qty 1

## 2018-06-22 MED ORDER — SODIUM CHLORIDE 0.9 % IV BOLUS (SEPSIS)
2000.0000 mL | Freq: Once | INTRAVENOUS | Status: AC
  Administered 2018-06-22: 1000 mL via INTRAVENOUS

## 2018-06-22 MED ORDER — CARISOPRODOL 350 MG PO TABS: 350.0000 mg | ORAL_TABLET | Freq: Three times a day (TID) | ORAL | Status: DC | PRN

## 2018-06-22 MED ORDER — HYDROCODONE-ACETAMINOPHEN 5-325 MG PO TABS: 1.0000 | ORAL_TABLET | ORAL | Status: DC | PRN

## 2018-06-22 NOTE — ED Triage Notes (Signed)
Patient complaining of mid chest pain starting last PM, cough with congestion "yellow stuff", states he was up coughing last PM.  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  Denies hx of chest pain.

## 2018-06-22 NOTE — ED Notes (Signed)
Patient off unit to CT

## 2018-06-22 NOTE — Progress Notes (Signed)
Family Meeting Note  Advance Directive:yes  Today a meeting took place with the Patient.  The following clinical team members were present during this meeting:MD  The following were discussed:Patient's diagnosis: A. fib, hypertension, pneumonia, influenza, Patient's progosis: Unable to determine and Goals for treatment: Full Code  Additional follow-up to be provided: PMD  Time spent during discussion:20 minutes  Vaughan Basta, MD

## 2018-06-22 NOTE — ED Notes (Signed)
Returned from [place]. C/o of pain to right side chest wall and ribs.

## 2018-06-22 NOTE — ED Notes (Signed)
MD in room to assess patient.  Will continue to monitor.

## 2018-06-22 NOTE — ED Notes (Signed)
Urine and urine culture resent.

## 2018-06-22 NOTE — ED Notes (Signed)
Patient to Rm 5, [name] aware.

## 2018-06-22 NOTE — ED Provider Notes (Signed)
[facility] Emergency Department Provider Note  ____________________________________________  Time seen: Approximately 12:33 PM  I have reviewed the triage vital signs and the nursing notes.   HISTORY  Chief Complaint Chest Pain and Cough    HPI [name] is a 68 y.o. male with a history of atrial fibrillation, diabetes, hypertension who complains of shortness of breath, generalized abdominal pain, productive cough for the past 24 hours.  No aggravating or alleviating factors.  Denies chest pain.  Worried that he has the flu.      Past Medical History:  Diagnosis Date  . Atrial fibrillation ([place])   . Cocaine abuse ([place])    + UDS on admission  . Diabetes mellitus without complication ([place])   . Gout current and history of  . History of deep vein thrombosis 2006  . History of tobacco abuse    has smoked for 50 years  . Hypertension   . Pulmonary embolism ([place])   . Stroke [facility])      Patient Active Problem List   Diagnosis Date Noted  . Influenza A 06/22/2018  . Community acquired pneumonia 06/22/2018  . Arthritis 05/12/2017  . Varicose veins of leg with swelling, bilateral 01/18/2017  . Facial twitching   . Cerebral infarction ([place]) 10/03/2016  . 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 Pneumonia 10/12/2014  . Osteoarthritis of right knee 02/11/2014  . Pain and swelling of knee 01/08/2014  . Pain in extremity 01/08/2014  . Weakness due to cerebrovascular accident 01/08/2014  . Pulmonary embolism ([place]) 06/19/2012  . Gout 09/22/2011  . 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 CVA (cerebral infarction) 04/26/2011  . Diabetes mellitus ([place]) 04/26/2011  . 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 Anemia 04/26/2011  . DVT (deep venous thrombosis) ([place]) 04/26/2011  . Essential (primary) hypertension 04/26/2011     Past Surgical History:  Procedure Laterality Date  . VASCULAR SURGERY      Stent in leg     Prior to Admission medications   Medication Sig Start Date End Date Taking? Authorizing Provider  apixaban (ELIQUIS) 5 MG TABS tablet 10 mg twice daily for 7 days followed by 5 mg twice daily. Patient taking differently: Take 5 mg by mouth 2 (two) times daily.  01/16/18  Yes Earleen Newport, MD  carisoprodol (SOMA) 350 MG tablet Take 1 tablet (350 mg total) by mouth 3 (three) times daily as needed. 11/24/17  Yes Orbie Pyo, MD  colchicine 0.6 MG tablet Take 0.6 mg by mouth daily.  04/24/18  Yes [provider]  diazepam (VALIUM) 5 MG tablet Take 1 tablet (5 mg total) by mouth every 8 (eight) hours as needed for anxiety. 02/28/18 02/28/19 Yes Eula Listen, MD  Ferrous Sulfate (IRON) 28 MG TABS Take 1 tablet by mouth daily.   Yes [provider]  furosemide (LASIX) 40 MG tablet Take 40 mg by mouth 2 (two) times daily.    Yes [provider]  hydrochlorothiazide (HYDRODIURIL) 12.5 MG tablet Take 12.5 mg by mouth daily. 04/10/18  Yes [provider]  HYDROcodone-acetaminophen (NORCO/VICODIN) 5-325 MG tablet Take 1 tablet by mouth every 4 (four) hours as needed. Patient taking differently: Take 1 tablet by mouth every 4 (four) hours as needed for moderate pain or severe pain.  02/27/18  Yes Harvest Dark, MD  ibuprofen (ADVIL,MOTRIN) 200 MG tablet Take 600 mg by mouth daily.    Yes [provider]  lactulose ([place])  10 GM/15ML solution Take 30 mLs (20 g total) by mouth daily as needed for mild constipation. 05/11/15  Yes Paulette Blanch, MD  losartan (COZAAR) 50 MG tablet Take 50 mg by mouth daily.  04/10/18 04/10/19 Yes [provider]  metFORMIN (GLUCOPHAGE) 500 MG tablet Take 500 mg by mouth 2 (two) times daily with a meal.  04/12/17  Yes [provider]  metoprolol tartrate (LOPRESSOR) 25 MG tablet Take 25 mg by mouth 2 (two) times daily.   Yes [provider]  potassium  chloride (K-DUR) 10 MEQ tablet Take 10 mEq by mouth daily.  02/09/18  Yes [provider]  simvastatin (ZOCOR) 20 MG tablet Take 20 mg by mouth at bedtime. 02/07/18  Yes [provider]     Allergies Patient has no known allergies.   Family History  Problem Relation Age of Onset  . Aneurysm Mother   . Diabetes Father     Social History Social History   Tobacco Use  . Smoking status: Current Every Day Smoker    Packs/day: 0.50    Years: 50.00    Pack years: 25.00    Types: Cigarettes  . Smokeless tobacco: Never Used  Substance Use Topics  . Alcohol use: No  . Drug use: No    Comment: Urinainary drug screen + cocaine on admit    Review of Systems  Constitutional:   No fever positive chills.  ENT:   No sore throat. No rhinorrhea. Cardiovascular:   No chest pain or syncope. Respiratory: Positive shortness of breath and productive cough. Gastrointestinal:   As above as above for generalized abdominal pain, nonradiating, without vomiting and diarrhea.  Musculoskeletal:   Negative for focal pain or swelling All other systems reviewed and are negative except as documented above in ROS and HPI.  ____________________________________________   PHYSICAL EXAM:  VITAL SIGNS: ED Triage Vitals  Enc Vitals Group     BP 06/22/18 0849 (!) 122/47     Pulse Rate 06/22/18 0849 74     Resp 06/22/18 0849 (!) 24     Temp 06/22/18 0849 99.7 F (37.6 C)     Temp Source 06/22/18 0849 Oral     SpO2 06/22/18 0849 100 %     Weight 06/22/18 0850 225 lb (102.1 kg)     Height 06/22/18 0850 6\' 2"  (1.88 m)     Head Circumference --      Peak Flow --      Pain Score 06/22/18 0850 7     Pain Loc --      Pain Edu? --      Excl. in [place]? --     Vital signs reviewed, nursing assessments reviewed.   Constitutional:   Alert and oriented.  Ill-appearing. Eyes:   Conjunctivae are normal. EOMI. PERRL. ENT      Head:   Normocephalic and atraumatic.      Nose:   No  congestion/rhinnorhea.       Mouth/Throat:   Mucous membranes, no pharyngeal erythema. No peritonsillar mass.       Neck:   No meningismus. Full ROM. Hematological/Lymphatic/Immunilogical:   No cervical lymphadenopathy. Cardiovascular:   RRR. Symmetric bilateral radial and DP pulses.  No murmurs. Cap refill less than 2 seconds. Respiratory:   Tachypnea, normal work of breathing.  Bibasilar crackles. Gastrointestinal:   Soft with diffuse upper abdominal tenderness. Non distended. There is no CVA tenderness.  No rebound, rigidity, or guarding. Musculoskeletal:   Normal range of motion  in all extremities. No joint effusions.  No lower extremity tenderness.  No edema. Neurologic:   Normal speech and language.  Motor grossly intact. No acute focal neurologic deficits are appreciated.  Skin:    Skin is warm, dry and intact. No rash noted.  No petechiae, purpura, or bullae.  ____________________________________________    LABS (pertinent positives/negatives) (all labs ordered are listed, but only abnormal results are displayed) Labs Reviewed  COMPREHENSIVE METABOLIC PANEL - Abnormal; Notable for the following components:      Result Value   Glucose, Bld 100 (*)    Total Bilirubin 1.3 (*)    All other components within normal limits  CBC WITH DIFFERENTIAL/PLATELET - Abnormal; Notable for the following components:   WBC 3.5 (*)    Hemoglobin 12.0 (*)    HCT 37.8 (*)    MCV 77.0 (*)    MCH 24.4 (*)    RDW 17.2 (*)    Platelets 135 (*)    Lymphs Abs 0.4 (*)    All other components within normal limits  INFLUENZA PANEL BY PCR (TYPE A & B) - Abnormal; Notable for the following components:   Influenza A By PCR POSITIVE (*)    All other components within normal limits  URINALYSIS, COMPLETE (UACMP) WITH MICROSCOPIC - Abnormal; Notable for the following components:   Color, Urine YELLOW (*)    APPearance CLEAR (*)    Specific Gravity, Urine 1.035 (*)    Hgb urine dipstick SMALL (*)     Protein, ur 30 (*)    All other components within normal limits  URINE CULTURE  CULTURE, BLOOD (ROUTINE X 2)  CULTURE, BLOOD (ROUTINE X 2)  LIPASE, BLOOD  TROPONIN I  LACTIC ACID, PLASMA  LACTIC ACID, PLASMA   ____________________________________________   EKG  Interpreted by me Atrial fibrillation rate of 77, normal axis and intervals.  Normal QRS and ST segments.  Diffuse T wave inversions in inferior and lateral leads.  Compared to prior EKG March 08, 2018, atrial fibrillation is old.  T wave inversions appear to be new.  ____________________________________________    RADIOLOGY  Ct Chest W Contrast  Result Date: 06/22/2018 CLINICAL DATA:  Patient complaining of mid chest pain starting last PM, cough with congestion "yellow stuff", states he was up coughing last PM. Right sided abdominal pain x 4 days. 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 Denies hx of chest pain EXAM: CT CHEST, ABDOMEN, AND PELVIS WITH CONTRAST TECHNIQUE: Multidetector CT imaging of the chest, abdomen and pelvis was performed following the standard protocol during bolus administration of intravenous contrast. CONTRAST:  137mL ISOVUE-300 IOPAMIDOL (ISOVUE-300) INJECTION 61% COMPARISON:  03/07/2018 and previous FINDINGS: CT CHEST FINDINGS Cardiovascular: Biatrial cardiac enlargement. No pericardial effusion. Dilated central pulmonary arteries. Scattered coarse coronary arterial calcifications. Scattered calcified plaque in the thoracic aorta without aneurysm. Mediastinum/Nodes: No hilar or mediastinal adenopathy. 1.8 cm low-attenuation right thyroid lesion. Lungs/Pleura: No pleural effusion. No pneumothorax. Patchy airspace opacities centrally in both lower lobes, new since prior CT chest from 03/03/2017. Chronic coarse pleural based scarring/atelectasis in the posterior right upper lobe. Small subpleural blebs in both upper lobes. Central peribronchial thickening bilaterally. Musculoskeletal: Vertebral endplate  spurring at multiple levels in the mid and lower thoracic spine. CT ABDOMEN PELVIS FINDINGS Hepatobiliary: No focal liver abnormality is seen. No gallstones, gallbladder wall thickening, or biliary dilatation. Pancreas: Unremarkable. No pancreatic ductal dilatation or surrounding inflammatory changes. Spleen: Normal in size without focal abnormality. Adrenals/Urinary Tract: Adrenal glands are unremarkable.  Kidneys are normal, without renal calculi, focal lesion, or hydronephrosis. Bladder is unremarkable. Stomach/Bowel: Stomach and small bowel are nondistended. Normal appendix. Colon is nondilated, unremarkable. Vascular/Lymphatic: Aortoiliac atheromatous calcified plaque without aneurysm or high-grade stenosis. Retrievable infrarenal IVC filter. No caval thrombus evident. No abdominal or pelvic adenopathy localized. Reproductive: Prostatic enlargement. Other: No ascites. No free air. Musculoskeletal: Spondylitic changes throughout the lumbar spine. Bilateral hip DJD. No fracture or worrisome bone lesion. IMPRESSION: 1. Patchy bilateral lower lobe airspace opacities suggesting pneumonia. 2. Dilated central pulmonary arteries suggesting pulmonary arterial hypertension. 3. Coronary and aortoiliac  atherosclerosis (ICD10-I70.0). 4. Retrievable IVC filter present since 2014. If caval filtration is no longer clinically indicated, consider IR consultation for filter removal. Electronically Signed   By: Lucrezia Europe M.D.   On: 06/22/2018 11:40   Ct Abdomen Pelvis W Contrast  Result Date: 06/22/2018 CLINICAL DATA:  Patient complaining of mid chest pain starting last PM, cough with congestion "yellow stuff", states he was up coughing last PM. Right sided abdominal pain x 4 days. 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 Denies hx of chest pain EXAM: CT CHEST, ABDOMEN, AND PELVIS WITH CONTRAST TECHNIQUE: Multidetector CT imaging of the chest, abdomen and pelvis was performed following the standard protocol during bolus  administration of intravenous contrast. CONTRAST:  191mL ISOVUE-300 IOPAMIDOL (ISOVUE-300) INJECTION 61% COMPARISON:  03/07/2018 and previous FINDINGS: CT CHEST FINDINGS Cardiovascular: Biatrial cardiac enlargement. No pericardial effusion. Dilated central pulmonary arteries. Scattered coarse coronary arterial calcifications. Scattered calcified plaque in the thoracic aorta without aneurysm. Mediastinum/Nodes: No hilar or mediastinal adenopathy. 1.8 cm low-attenuation right thyroid lesion. Lungs/Pleura: No pleural effusion. No pneumothorax. Patchy airspace opacities centrally in both lower lobes, new since prior CT chest from 03/03/2017. Chronic coarse pleural based scarring/atelectasis in the posterior right upper lobe. Small subpleural blebs in both upper lobes. Central peribronchial thickening bilaterally. Musculoskeletal: Vertebral endplate spurring at multiple levels in the mid and lower thoracic spine. CT ABDOMEN PELVIS FINDINGS Hepatobiliary: No focal liver abnormality is seen. No gallstones, gallbladder wall thickening, or biliary dilatation. Pancreas: Unremarkable. No pancreatic ductal dilatation or surrounding inflammatory changes. Spleen: Normal in size without focal abnormality. Adrenals/Urinary Tract: Adrenal glands are unremarkable. Kidneys are normal, without renal calculi, focal lesion, or hydronephrosis. Bladder is unremarkable. Stomach/Bowel: Stomach and small bowel are nondistended. Normal appendix. Colon is nondilated, unremarkable. Vascular/Lymphatic: Aortoiliac atheromatous calcified plaque without aneurysm or high-grade stenosis. Retrievable infrarenal IVC filter. No caval thrombus evident. No abdominal or pelvic adenopathy localized. Reproductive: Prostatic enlargement. Other: No ascites. No free air. Musculoskeletal: Spondylitic changes throughout the lumbar spine. Bilateral hip DJD. No fracture or worrisome bone lesion. IMPRESSION: 1. Patchy bilateral lower lobe airspace opacities  suggesting pneumonia. 2. Dilated central pulmonary arteries suggesting pulmonary arterial hypertension. 3. Coronary and aortoiliac  atherosclerosis (ICD10-I70.0). 4. Retrievable IVC filter present since 2014. If caval filtration is no longer clinically indicated, consider IR consultation for filter removal. Electronically Signed   By: Lucrezia Europe M.D.   On: 06/22/2018 11:40    ____________________________________________   PROCEDURES .Critical Care Performed by: Carrie Mew, MD Authorized by: Carrie Mew, MD   Critical care provider statement:    Critical care time (minutes):  35   Critical care time was exclusive of:  Separately billable procedures and treating other patients   Critical care was necessary to treat or prevent imminent or life-threatening deterioration of the following conditions:  Sepsis and respiratory failure   Critical care was time spent personally by me on the following activities:  Development of treatment plan with patient or surrogate, discussions with consultants, evaluation of patient's response to treatment, examination of patient, obtaining history from patient or surrogate, ordering and performing treatments and interventions, ordering and review of laboratory studies, ordering and review of radiographic studies, pulse oximetry, re-evaluation of patient's condition and review of old charts    ____________________________________________  DIFFERENTIAL DIAGNOSIS   Pancreatitis, bowel perforation, obstruction, cholecystitis/choledocholithiasis, pneumonia, pneumothorax, influenza  CLINICAL IMPRESSION / ASSESSMENT AND PLAN / ED COURSE  Pertinent labs & imaging results that were available during my care of the patient were reviewed by me and considered in my medical decision making (see chart for details).    Patient presents with upper abdominal pain and tenderness, some respiratory symptoms.  Possibly influenza, also worried about intra-abdominal  pathology.  Patient is tachypneic on arrival but otherwise unremarkable vital signs and not septic.  Clinical Course as of Jun 22 1417  Thu Jun 22, 2018  1223 Work-up shows bilateral basilar pneumonia as well as influenza A positive.  With patient's tachypnea and leukopenia, age and comorbidities, recommend hospitalization for further management and antibiotic therapy.   [PS]    Clinical Course User Index [PS] Carrie Mew, MD    ----------------------------------------- 2:18 PM on 06/22/2018 -----------------------------------------  During patient stay in the ED, in addition to leukopenia and persistent tachypnea he also developed hypoxia with a room air oxygen saturation of 89%, meeting sepsis criteria at that time at 12:20 PM.  Blood cultures and lactate were sent.  Lactate was normal.  Repeat lactate not indicated.  Large volume fluid boluses not indicated.  Case discussed with hospitalist for further management.  Oxygenation stabilized on nasal cannula.   ____________________________________________   FINAL CLINICAL IMPRESSION(S) / ED DIAGNOSES    Final diagnoses:  Pneumonia of both lower lobes due to infectious organism [facility])  Influenza A  Sepsis with acute hypoxic respiratory failure without septic shock, due to unspecified organism [facility])     ED Discharge Orders    None      Portions of this note were generated with dragon dictation software. Dictation errors may occur despite best attempts at proofreading.   Carrie Mew, MD 06/22/18 [id number]

## 2018-06-22 NOTE — H&P (Addendum)
[facility] [place] NAME: [name]    MR#:  [id number]  DATE OF BIRTH:  [birth year]  DATE OF ADMISSION:  06/22/2018  PRIMARY CARE PHYSICIAN: [name], MD   REQUESTING/REFERRING PHYSICIAN: Joni Fears  CHIEF COMPLAINT:   Chief Complaint  Patient presents with  . Chest Pain  . Cough    HISTORY OF PRESENT ILLNESS: [name]  is a 68 y.o. male with a known history of atrial fibrillation, diabetes, gout, DVT, hypertension, pulmonary embolism, stroke-was feeling short of breath with cough and yellowish sputum production with some fever and chills for last 3 days.  Emergency room noted to have influenza A positive and also had pneumonia noted on chest CT. Concerned with this ER physician suggested to admit to hospitalist service. He has generalized weakness and shortness of breath on exertion and could not perform his day-to-day activities so he need to be admitted.  PAST MEDICAL HISTORY:   Past Medical History:  Diagnosis Date  . Atrial fibrillation ([place])   . Cocaine abuse ([place])    + UDS on admission  . Diabetes mellitus without complication ([place])   . Gout current and history of  . History of deep vein thrombosis 2006  . History of tobacco abuse    has smoked for 50 years  . Hypertension   . Pulmonary embolism ([place])   . Stroke [facility])     PAST SURGICAL HISTORY:  Past Surgical History:  Procedure Laterality Date  . VASCULAR SURGERY     Stent in leg    SOCIAL HISTORY:  Social History   Tobacco Use  . Smoking status: Current Every Day Smoker    Packs/day: 0.50    Years: 50.00    Pack years: 25.00    Types: Cigarettes  . Smokeless tobacco: Never Used  Substance Use Topics  . Alcohol use: No    FAMILY HISTORY:  Family History  Problem Relation Age of Onset  . Aneurysm Mother   . Diabetes Father     DRUG ALLERGIES: No Known Allergies  REVIEW OF SYSTEMS:   CONSTITUTIONAL: No fever, fatigue or  weakness.  EYES: No blurred or double vision.  EARS, NOSE, AND THROAT: No tinnitus or ear pain.  RESPIRATORY: He have cough, shortness of breath, no wheezing or hemoptysis.  CARDIOVASCULAR: No chest pain, orthopnea, edema.  GASTROINTESTINAL: No nausea, vomiting, diarrhea or abdominal pain.  GENITOURINARY: No dysuria, hematuria.  ENDOCRINE: No polyuria, nocturia,  HEMATOLOGY: No anemia, easy bruising or bleeding SKIN: No rash or lesion. MUSCULOSKELETAL: No joint pain or arthritis.   NEUROLOGIC: No tingling, numbness, weakness.  PSYCHIATRY: No anxiety or depression.   MEDICATIONS AT HOME:  Prior to Admission medications   Medication Sig Start Date End Date Taking? Authorizing Provider  apixaban (ELIQUIS) 5 MG TABS tablet 10 mg twice daily for 7 days followed by 5 mg twice daily. Patient taking differently: Take 5 mg by mouth 2 (two) times daily.  01/16/18  Yes Earleen Newport, MD  carisoprodol (SOMA) 350 MG tablet Take 1 tablet (350 mg total) by mouth 3 (three) times daily as needed. 11/24/17  Yes Orbie Pyo, MD  colchicine 0.6 MG tablet Take 0.6 mg by mouth daily.  04/24/18  Yes [provider]  diazepam (VALIUM) 5 MG tablet Take 1 tablet (5 mg total) by mouth every 8 (eight) hours as needed for anxiety. 02/28/18 02/28/19 Yes Eula Listen, MD  Ferrous Sulfate (IRON) 28 MG TABS Take 1  tablet by mouth daily.   Yes [provider]  furosemide (LASIX) 40 MG tablet Take 40 mg by mouth 2 (two) times daily.    Yes [provider]  hydrochlorothiazide (HYDRODIURIL) 12.5 MG tablet Take 12.5 mg by mouth daily. 04/10/18  Yes [provider]  HYDROcodone-acetaminophen (NORCO/VICODIN) 5-325 MG tablet Take 1 tablet by mouth every 4 (four) hours as needed. Patient taking differently: Take 1 tablet by mouth every 4 (four) hours as needed for moderate pain or severe pain.  02/27/18  Yes Harvest Dark, MD  ibuprofen (ADVIL,MOTRIN) 200 MG tablet  Take 600 mg by mouth daily.    Yes [provider]  lactulose (CHRONULAC) 10 GM/15ML solution Take 30 mLs (20 g total) by mouth daily as needed for mild constipation. 05/11/15  Yes Paulette Blanch, MD  losartan (COZAAR) 50 MG tablet Take 50 mg by mouth daily.  04/10/18 04/10/19 Yes [provider]  metFORMIN (GLUCOPHAGE) 500 MG tablet Take 500 mg by mouth 2 (two) times daily with a meal.  04/12/17  Yes [provider]  metoprolol tartrate (LOPRESSOR) 25 MG tablet Take 25 mg by mouth 2 (two) times daily.   Yes [provider]  potassium chloride (K-DUR) 10 MEQ tablet Take 10 mEq by mouth daily.  02/09/18  Yes [provider]  simvastatin (ZOCOR) 20 MG tablet Take 20 mg by mouth at bedtime. 02/07/18  Yes [provider]      PHYSICAL EXAMINATION:   VITAL SIGNS: Blood pressure (!) 140/95, pulse 76, temperature 98.1 F (36.7 C), temperature source Oral, resp. rate (!) 24, height 6\' 2"  (1.88 m), weight 102.1 kg, SpO2 100 %.  GENERAL:  68 y.o.-year-old patient lying in the bed with no acute distress.  EYES: Pupils equal, round, reactive to light and accommodation. No scleral icterus. Extraocular muscles intact.  HEENT: Head atraumatic, normocephalic. Oropharynx and nasopharynx clear.  NECK:  Supple, no jugular venous distention. No thyroid enlargement, no tenderness.  LUNGS: Normal breath sounds bilaterally, no wheezing, some crepitation. No use of accessory muscles of respiration.  Repeated coughing during my exam. CARDIOVASCULAR: S1, S2 normal. No murmurs, rubs, or gallops.  ABDOMEN: Soft, nontender, nondistended. Bowel sounds present. No organomegaly or mass.  EXTREMITIES: No pedal edema, cyanosis, or clubbing.  Right lower extremity has dressing present due to wound on his feet from wound care center which is chronic. NEUROLOGIC: Cranial nerves II through XII are intact.  Some aphasia which is chronic since stroke 3 years ago.  Muscle strength 4/5  in all extremities. Sensation intact. Gait not checked.  PSYCHIATRIC: The patient is alert and oriented x 3.  SKIN: No obvious rash, lesion, or ulcer.   LABORATORY PANEL:   CBC Recent Labs  Lab 06/22/18 1013  WBC 3.5*  HGB 12.0*  HCT 37.8*  PLT 135*  MCV 77.0*  MCH 24.4*  MCHC 31.7  RDW 17.2*  LYMPHSABS 0.4*  MONOABS 0.3  EOSABS 0.0  BASOSABS 0.0   ------------------------------------------------------------------------------------------------------------------  Chemistries  Recent Labs  Lab 06/22/18 1013  NA 136  K 3.7  CL 102  CO2 26  GLUCOSE 100*  BUN 20  CREATININE 0.84  CALCIUM 8.9  AST 24  ALT 15  ALKPHOS 52  BILITOT 1.3*   ------------------------------------------------------------------------------------------------------------------ estimated creatinine clearance is 105.9 mL/min (by C-G formula based on SCr of 0.84 mg/dL). ------------------------------------------------------------------------------------------------------------------ No results for input(s): TSH, T4TOTAL, T3FREE, THYROIDAB in the last 72 hours.  Invalid input(s): FREET3   Coagulation profile No results for  input(s): INR, PROTIME in the last 168 hours. ------------------------------------------------------------------------------------------------------------------- No results for input(s): DDIMER in the last 72 hours. -------------------------------------------------------------------------------------------------------------------  Cardiac Enzymes Recent Labs  Lab 06/22/18 1013  TROPONINI <0.03   ------------------------------------------------------------------------------------------------------------------ Invalid input(s): POCBNP  ---------------------------------------------------------------------------------------------------------------  Urinalysis    Component Value Date/Time   COLORURINE STRAW (A) 02/28/2018 1025   APPEARANCEUR CLEAR (A) 02/28/2018 1025    APPEARANCEUR Clear 01/25/2013 1042   LABSPEC 1.006 02/28/2018 1025   LABSPEC 1.026 01/25/2013 1042   PHURINE 6.0 02/28/2018 1025   GLUCOSEU NEGATIVE 02/28/2018 1025   GLUCOSEU Negative 01/25/2013 1042   HGBUR NEGATIVE 02/28/2018 1025   BILIRUBINUR NEGATIVE 02/28/2018 1025   BILIRUBINUR Negative 01/25/2013 1042   KETONESUR NEGATIVE 02/28/2018 1025   PROTEINUR 30 (A) 02/28/2018 1025   UROBILINOGEN 1.0 04/28/2011 1934   NITRITE NEGATIVE 02/28/2018 1025   LEUKOCYTESUR NEGATIVE 02/28/2018 1025   LEUKOCYTESUR Negative 01/25/2013 1042     RADIOLOGY: Ct Chest W Contrast  Result Date: 06/22/2018 CLINICAL DATA:  Patient complaining of mid chest pain starting last PM, cough with congestion "yellow stuff", states he was up coughing last PM. Right sided abdominal pain x 4 days. 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 Denies hx of chest pain EXAM: CT CHEST, ABDOMEN, AND PELVIS WITH CONTRAST TECHNIQUE: Multidetector CT imaging of the chest, abdomen and pelvis was performed following the standard protocol during bolus administration of intravenous contrast. CONTRAST:  166mL ISOVUE-300 IOPAMIDOL (ISOVUE-300) INJECTION 61% COMPARISON:  03/07/2018 and previous FINDINGS: CT CHEST FINDINGS Cardiovascular: Biatrial cardiac enlargement. No pericardial effusion. Dilated central pulmonary arteries. Scattered coarse coronary arterial calcifications. Scattered calcified plaque in the thoracic aorta without aneurysm. Mediastinum/Nodes: No hilar or mediastinal adenopathy. 1.8 cm low-attenuation right thyroid lesion. Lungs/Pleura: No pleural effusion. No pneumothorax. Patchy airspace opacities centrally in both lower lobes, new since prior CT chest from 03/03/2017. Chronic coarse pleural based scarring/atelectasis in the posterior right upper lobe. Small subpleural blebs in both upper lobes. Central peribronchial thickening bilaterally. Musculoskeletal: Vertebral endplate spurring at multiple levels in the mid and  lower thoracic spine. CT ABDOMEN PELVIS FINDINGS Hepatobiliary: No focal liver abnormality is seen. No gallstones, gallbladder wall thickening, or biliary dilatation. Pancreas: Unremarkable. No pancreatic ductal dilatation or surrounding inflammatory changes. Spleen: Normal in size without focal abnormality. Adrenals/Urinary Tract: Adrenal glands are unremarkable. Kidneys are normal, without renal calculi, focal lesion, or hydronephrosis. Bladder is unremarkable. Stomach/Bowel: Stomach and small bowel are nondistended. Normal appendix. Colon is nondilated, unremarkable. Vascular/Lymphatic: Aortoiliac atheromatous calcified plaque without aneurysm or high-grade stenosis. Retrievable infrarenal IVC filter. No caval thrombus evident. No abdominal or pelvic adenopathy localized. Reproductive: Prostatic enlargement. Other: No ascites. No free air. Musculoskeletal: Spondylitic changes throughout the lumbar spine. Bilateral hip DJD. No fracture or worrisome bone lesion. IMPRESSION: 1. Patchy bilateral lower lobe airspace opacities suggesting pneumonia. 2. Dilated central pulmonary arteries suggesting pulmonary arterial hypertension. 3. Coronary and aortoiliac  atherosclerosis (ICD10-I70.0). 4. Retrievable IVC filter present since 2014. If caval filtration is no longer clinically indicated, consider IR consultation for filter removal. Electronically Signed   By: Lucrezia Europe M.D.   On: 06/22/2018 11:40   Ct Abdomen Pelvis W Contrast  Result Date: 06/22/2018 CLINICAL DATA:  Patient complaining of mid chest pain starting last PM, cough with congestion "yellow stuff", states he was up coughing last PM. Right sided abdominal pain x 4 days. 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 Denies hx of chest pain EXAM: CT CHEST, ABDOMEN, AND PELVIS WITH CONTRAST TECHNIQUE: Multidetector CT imaging  of the chest, abdomen and pelvis was performed following the standard protocol during bolus administration of intravenous contrast.  CONTRAST:  154mL ISOVUE-300 IOPAMIDOL (ISOVUE-300) INJECTION 61% COMPARISON:  03/07/2018 and previous FINDINGS: CT CHEST FINDINGS Cardiovascular: Biatrial cardiac enlargement. No pericardial effusion. Dilated central pulmonary arteries. Scattered coarse coronary arterial calcifications. Scattered calcified plaque in the thoracic aorta without aneurysm. Mediastinum/Nodes: No hilar or mediastinal adenopathy. 1.8 cm low-attenuation right thyroid lesion. Lungs/Pleura: No pleural effusion. No pneumothorax. Patchy airspace opacities centrally in both lower lobes, new since prior CT chest from 03/03/2017. Chronic coarse pleural based scarring/atelectasis in the posterior right upper lobe. Small subpleural blebs in both upper lobes. Central peribronchial thickening bilaterally. Musculoskeletal: Vertebral endplate spurring at multiple levels in the mid and lower thoracic spine. CT ABDOMEN PELVIS FINDINGS Hepatobiliary: No focal liver abnormality is seen. No gallstones, gallbladder wall thickening, or biliary dilatation. Pancreas: Unremarkable. No pancreatic ductal dilatation or surrounding inflammatory changes. Spleen: Normal in size without focal abnormality. Adrenals/Urinary Tract: Adrenal glands are unremarkable. Kidneys are normal, without renal calculi, focal lesion, or hydronephrosis. Bladder is unremarkable. Stomach/Bowel: Stomach and small bowel are nondistended. Normal appendix. Colon is nondilated, unremarkable. Vascular/Lymphatic: Aortoiliac atheromatous calcified plaque without aneurysm or high-grade stenosis. Retrievable infrarenal IVC filter. No caval thrombus evident. No abdominal or pelvic adenopathy localized. Reproductive: Prostatic enlargement. Other: No ascites. No free air. Musculoskeletal: Spondylitic changes throughout the lumbar spine. Bilateral hip DJD. No fracture or worrisome bone lesion. IMPRESSION: 1. Patchy bilateral lower lobe airspace opacities suggesting pneumonia. 2. Dilated central  pulmonary arteries suggesting pulmonary arterial hypertension. 3. Coronary and aortoiliac  atherosclerosis (ICD10-I70.0). 4. Retrievable IVC filter present since 2014. If caval filtration is no longer clinically indicated, consider IR consultation for filter removal. Electronically Signed   By: Lucrezia Europe M.D.   On: 06/22/2018 11:40    EKG: Orders placed or performed during the hospital encounter of 03/07/18  . EKG 12-Lead  . EKG 12-Lead  . ED EKG  . ED EKG  . EKG 12-Lead  . EKG 12-Lead  . EKG    IMPRESSION AND PLAN:  *Influenza A We will give Tamiflu.  *Pneumonia Noted on chest CT. Will give Rocephin and azithromycin for community-acquired pneumonia. Blood cultures are sent.  *History of DVT and PE more than 4 years ago He is on Eliquis. Continue for now. As per CT scan abdomen he has IVC filter still in place. He does not seem to be at high risk of getting recurrent DVT, which would recommend him to discuss this with his primary care physician and decide taking the IVC filter off if not needed any longer.  *Atrial fibrillation Continue metoprolol and Eliquis.  *Gout Continue colchicine.  *Hypertension Continue losartan, Lasix, hydrochlorothiazide, metoprolol.  *Active smoking Counseled to quit smoking for 4 minutes and offered nicotine patch.  All the records are reviewed and case discussed with ED provider. Management plans discussed with the patient, family and they are in agreement.  CODE STATUS: Full code Code Status History    Date Active Date Inactive Code Status Order ID Comments User Context   03/03/2017 1541 03/04/2017 1545 Full Code [id number]  Demetrios Loll, MD Inpatient   10/03/2016 1535 10/05/2016 1535 Full Code [id number]  [name], MD Inpatient   10/12/2014 0953 10/14/2014 1514 Full Code [id number]  Gladstone Lighter, MD Inpatient       TOTAL TIME TAKING CARE OF THIS PATIENT: 50 minutes.    Vaughan Basta M.D on 06/22/2018   Between 7am  to 6pm - Pager -  [phone]  After 6pm go to [email or web address] - password [email or web address] [place] [facility]  Office  [phone]  CC: Primary care physician; Theotis Burrow, MD   Note: This dictation was prepared with Dragon dictation along with smaller phrase technology. Any transcriptional errors that result from this process are unintentional.

## 2018-06-22 NOTE — ED Notes (Signed)
ED TO INPATIENT HANDOFF REPORT  Name/Age/Gender [name] 68 y.o. male  Code Status Code Status History    Date Active Date Inactive Code Status Order ID Comments User Context   03/03/2017 1541 03/04/2017 1545 Full Code [id number]  Demetrios Loll, MD Inpatient   10/03/2016 1535 10/05/2016 1535 Full Code [id number]  [name], MD Inpatient   10/12/2014 0953 10/14/2014 1514 Full Code [id number]  Gladstone Lighter, MD Inpatient      Home/SNF/Other From home   Chief Complaint chest pain  Level of Care/Admitting Diagnosis ED Disposition    ED Disposition Condition [place]: [place] [100120]  Level of Care: Med-Surg [16]  Diagnosis: Community acquired pneumonia [196222]  Admitting Physician: Vaughan Basta [phone]  Attending Physician: Vaughan Basta [phone]  Estimated length of stay: past midnight tomorrow  Certification:: I certify this patient will need inpatient services for at least 2 midnights  PT Class (Do Not Modify): Inpatient [101]  PT Acc Code (Do Not Modify): Private [1]       Medical History Past Medical History:  Diagnosis Date  . Atrial fibrillation ([place])   . Cocaine abuse ([place])    + UDS on admission  . Diabetes mellitus without complication ([place])   . Gout current and history of  . History of deep vein thrombosis 2006  . History of tobacco abuse    has smoked for 50 years  . Hypertension   . Pulmonary embolism ([place])   . Stroke [facility])     Allergies No Known Allergies  IV Location/Drains/Wounds Patient Lines/Drains/Airways Status   Active Line/Drains/Airways    Name:   Placement date:   Placement time:   Site:   Days:   Peripheral IV 06/22/18 Right Forearm   06/22/18    1015    Forearm   less than 1          Labs/Imaging Results for orders placed or performed during the hospital encounter of 06/22/18 (from the past 48 hour(s))  Comprehensive metabolic panel     Status:  Abnormal   Collection Time: 06/22/18 10:13 AM  Result Value Ref Range   Sodium 136 135 - 145 mmol/L   Potassium 3.7 3.5 - 5.1 mmol/L   Chloride 102 98 - 111 mmol/L   CO2 26 22 - 32 mmol/L   Glucose, Bld 100 (H) 70 - 99 mg/dL   BUN 20 8 - 23 mg/dL   Creatinine, Ser 0.84 0.61 - 1.24 mg/dL   Calcium 8.9 8.9 - 10.3 mg/dL   Total Protein 7.0 6.5 - 8.1 g/dL   Albumin 3.9 3.5 - 5.0 g/dL   AST 24 15 - 41 U/L   ALT 15 0 - 44 U/L   Alkaline Phosphatase 52 38 - 126 U/L   Total Bilirubin 1.3 (H) 0.3 - 1.2 mg/dL   GFR calc non Af Amer >60 >60 mL/min   GFR calc Af Amer >60 >60 mL/min   Anion gap 8 5 - 15    Comment: Performed at [facility], [address] [place], [place] [address]  CBC with Differential     Status: Abnormal   Collection Time: 06/22/18 10:13 AM  Result Value Ref Range   WBC 3.5 (L) 4.0 - 10.5 K/uL   RBC 4.91 4.22 - 5.81 MIL/uL   Hemoglobin 12.0 (L) 13.0 - 17.0 g/dL   HCT 37.8 (L) 39.0 - 52.0 %   MCV 77.0 (L) 80.0 - 100.0 fL   MCH  24.4 (L) 26.0 - 34.0 pg   MCHC 31.7 30.0 - 36.0 g/dL   RDW 17.2 (H) 11.5 - 15.5 %   Platelets 135 (L) 150 - 400 K/uL   nRBC 0.0 0.0 - 0.2 %   Neutrophils Relative % 82 %   Neutro Abs 2.8 1.7 - 7.7 K/uL   Lymphocytes Relative 10 %   Lymphs Abs 0.4 (L) 0.7 - 4.0 K/uL   Monocytes Relative 7 %   Monocytes Absolute 0.3 0.1 - 1.0 K/uL   Eosinophils Relative 1 %   Eosinophils Absolute 0.0 0.0 - 0.5 K/uL   Basophils Relative 0 %   Basophils Absolute 0.0 0.0 - 0.1 K/uL   Immature Granulocytes 0 %   Abs Immature Granulocytes 0.01 0.00 - 0.07 K/uL    Comment: Performed at [facility], [address] [place], [place] [address]  Lipase, blood     Status: None   Collection Time: 06/22/18 10:13 AM  Result Value Ref Range   Lipase 19 11 - 51 U/L    Comment: Performed at [facility], [address] [place], [place] [address]  Troponin I - Add-On to previous collection     Status: None   Collection Time: 06/22/18  10:13 AM  Result Value Ref Range   Troponin I <0.03 <0.03 ng/mL    Comment: Performed at [facility], [address] [place], [place] [address]  Influenza panel by PCR (type A & B)     Status: Abnormal   Collection Time: 06/22/18 10:14 AM  Result Value Ref Range   Influenza A By PCR POSITIVE (A) NEGATIVE   Influenza B By PCR NEGATIVE NEGATIVE    Comment: (NOTE) The Xpert Xpress Flu assay is intended as an aid in the diagnosis of  influenza and should not be used as a sole basis for treatment.  This  assay is FDA approved for nasopharyngeal swab specimens only. Nasal  washings and aspirates are unacceptable for Xpert Xpress Flu testing. Performed at [facility], [address] [place], [place] [address]   Lactic acid, plasma     Status: None   Collection Time: 06/22/18  1:25 PM  Result Value Ref Range   Lactic Acid, Venous 1.1 0.5 - 1.9 mmol/L    Comment: Performed at [facility], [address] [place], [place] [address]  Urinalysis, Complete w Microscopic     Status: Abnormal   Collection Time: 06/22/18  1:55 PM  Result Value Ref Range   Color, Urine YELLOW (A) YELLOW   APPearance CLEAR (A) CLEAR   Specific Gravity, Urine 1.035 (H) 1.005 - 1.030   pH 6.0 5.0 - 8.0   Glucose, UA NEGATIVE NEGATIVE mg/dL   Hgb urine dipstick SMALL (A) NEGATIVE   Bilirubin Urine NEGATIVE NEGATIVE   Ketones, ur NEGATIVE NEGATIVE mg/dL   Protein, ur 30 (A) NEGATIVE mg/dL   Nitrite NEGATIVE NEGATIVE   Leukocytes, UA NEGATIVE NEGATIVE   RBC / HPF 6-10 0 - 5 RBC/hpf   WBC, UA 0-5 0 - 5 WBC/hpf   Bacteria, UA NONE SEEN NONE SEEN   Squamous Epithelial / LPF NONE SEEN 0 - 5    Comment: Performed at [facility], [address] [place], [place] [address]   Ct Chest W Contrast  Result Date: 06/22/2018 CLINICAL DATA:  Patient complaining of mid chest pain starting last PM, cough with congestion "yellow stuff", states he was up coughing last PM. Right sided  abdominal pain  x 4 days. 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 Denies hx of chest pain EXAM: CT CHEST, ABDOMEN, AND PELVIS WITH CONTRAST TECHNIQUE: Multidetector CT imaging of the chest, abdomen and pelvis was performed following the standard protocol during bolus administration of intravenous contrast. CONTRAST:  180mL ISOVUE-300 IOPAMIDOL (ISOVUE-300) INJECTION 61% COMPARISON:  03/07/2018 and previous FINDINGS: CT CHEST FINDINGS Cardiovascular: Biatrial cardiac enlargement. No pericardial effusion. Dilated central pulmonary arteries. Scattered coarse coronary arterial calcifications. Scattered calcified plaque in the thoracic aorta without aneurysm. Mediastinum/Nodes: No hilar or mediastinal adenopathy. 1.8 cm low-attenuation right thyroid lesion. Lungs/Pleura: No pleural effusion. No pneumothorax. Patchy airspace opacities centrally in both lower lobes, new since prior CT chest from 03/03/2017. Chronic coarse pleural based scarring/atelectasis in the posterior right upper lobe. Small subpleural blebs in both upper lobes. Central peribronchial thickening bilaterally. Musculoskeletal: Vertebral endplate spurring at multiple levels in the mid and lower thoracic spine. CT ABDOMEN PELVIS FINDINGS Hepatobiliary: No focal liver abnormality is seen. No gallstones, gallbladder wall thickening, or biliary dilatation. Pancreas: Unremarkable. No pancreatic ductal dilatation or surrounding inflammatory changes. Spleen: Normal in size without focal abnormality. Adrenals/Urinary Tract: Adrenal glands are unremarkable. Kidneys are normal, without renal calculi, focal lesion, or hydronephrosis. Bladder is unremarkable. Stomach/Bowel: Stomach and small bowel are nondistended. Normal appendix. Colon is nondilated, unremarkable. Vascular/Lymphatic: Aortoiliac atheromatous calcified plaque without aneurysm or high-grade stenosis. Retrievable infrarenal IVC filter. No caval thrombus evident. No abdominal or pelvic adenopathy  localized. Reproductive: Prostatic enlargement. Other: No ascites. No free air. Musculoskeletal: Spondylitic changes throughout the lumbar spine. Bilateral hip DJD. No fracture or worrisome bone lesion. IMPRESSION: 1. Patchy bilateral lower lobe airspace opacities suggesting pneumonia. 2. Dilated central pulmonary arteries suggesting pulmonary arterial hypertension. 3. Coronary and aortoiliac  atherosclerosis (ICD10-I70.0). 4. Retrievable IVC filter present since 2014. If caval filtration is no longer clinically indicated, consider IR consultation for filter removal. Electronically Signed   By: Lucrezia Europe M.D.   On: 06/22/2018 11:40   Ct Abdomen Pelvis W Contrast  Result Date: 06/22/2018 CLINICAL DATA:  Patient complaining of mid chest pain starting last PM, cough with congestion "yellow stuff", states he was up coughing last PM. Right sided abdominal pain x 4 days. 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 Denies hx of chest pain EXAM: CT CHEST, ABDOMEN, AND PELVIS WITH CONTRAST TECHNIQUE: Multidetector CT imaging of the chest, abdomen and pelvis was performed following the standard protocol during bolus administration of intravenous contrast. CONTRAST:  156mL ISOVUE-300 IOPAMIDOL (ISOVUE-300) INJECTION 61% COMPARISON:  03/07/2018 and previous FINDINGS: CT CHEST FINDINGS Cardiovascular: Biatrial cardiac enlargement. No pericardial effusion. Dilated central pulmonary arteries. Scattered coarse coronary arterial calcifications. Scattered calcified plaque in the thoracic aorta without aneurysm. Mediastinum/Nodes: No hilar or mediastinal adenopathy. 1.8 cm low-attenuation right thyroid lesion. Lungs/Pleura: No pleural effusion. No pneumothorax. Patchy airspace opacities centrally in both lower lobes, new since prior CT chest from 03/03/2017. Chronic coarse pleural based scarring/atelectasis in the posterior right upper lobe. Small subpleural blebs in both upper lobes. Central peribronchial thickening  bilaterally. Musculoskeletal: Vertebral endplate spurring at multiple levels in the mid and lower thoracic spine. CT ABDOMEN PELVIS FINDINGS Hepatobiliary: No focal liver abnormality is seen. No gallstones, gallbladder wall thickening, or biliary dilatation. Pancreas: Unremarkable. No pancreatic ductal dilatation or surrounding inflammatory changes. Spleen: Normal in size without focal abnormality. Adrenals/Urinary Tract: Adrenal glands are unremarkable. Kidneys are normal, without renal calculi, focal lesion, or hydronephrosis. Bladder is unremarkable. Stomach/Bowel: Stomach and small bowel are nondistended. Normal appendix. Colon  is nondilated, unremarkable. Vascular/Lymphatic: Aortoiliac atheromatous calcified plaque without aneurysm or high-grade stenosis. Retrievable infrarenal IVC filter. No caval thrombus evident. No abdominal or pelvic adenopathy localized. Reproductive: Prostatic enlargement. Other: No ascites. No free air. Musculoskeletal: Spondylitic changes throughout the lumbar spine. Bilateral hip DJD. No fracture or worrisome bone lesion. IMPRESSION: 1. Patchy bilateral lower lobe airspace opacities suggesting pneumonia. 2. Dilated central pulmonary arteries suggesting pulmonary arterial hypertension. 3. Coronary and aortoiliac  atherosclerosis (ICD10-I70.0). 4. Retrievable IVC filter present since 2014. If caval filtration is no longer clinically indicated, consider IR consultation for filter removal. Electronically Signed   By: Lucrezia Europe M.D.   On: 06/22/2018 11:40    Pending Labs Unresulted Labs (From admission, onward)    Start     Ordered   06/22/18 1249  Lactic acid, plasma  STAT Now then every 3 hours,   STAT     06/22/18 1248   06/22/18 1249  Blood Culture (routine x 2)  BLOOD CULTURE X 2,   STAT     06/22/18 1248   06/22/18 0930  Urine culture  Once,   STAT     06/22/18 0930   Signed and Held  HIV antibody (Routine Testing)  Once,   R     Signed and Held   Signed and Held   Basic metabolic panel  Tomorrow morning,   R     Signed and Held   Signed and Held  CBC  Tomorrow morning,   R     Signed and Held          Vitals/Pain Today's Vitals   06/22/18 1300 06/22/18 1315 06/22/18 1330 06/22/18 1345  BP: (!) 153/98     Pulse: 84 96 80 77  Resp: (!) 22     Temp:      TempSrc:      SpO2: 94% 92% 99% 96%  Weight:      Height:      PainSc:        Isolation Precautions Droplet precaution  Medications Medications  cefTRIAXone (ROCEPHIN) 2 g in sodium chloride 0.9 % 100 mL IVPB (0 g Intravenous Stopped 06/22/18 1417)  azithromycin (ZITHROMAX) 500 mg in sodium chloride 0.9 % 250 mL IVPB (500 mg Intravenous New Bag/Given 06/22/18 1418)  insulin aspart (novoLOG) injection 0-9 Units (has no administration in time range)  insulin aspart (novoLOG) injection 0-5 Units (has no administration in time range)  oseltamivir (TAMIFLU) capsule 75 mg (has no administration in time range)  ipratropium-albuterol (DUONEB) 0.5-2.5 (3) MG/3ML nebulizer solution 3 mL (has no administration in time range)  sodium chloride 0.9 % bolus 1,000 mL (0 mLs Intravenous Stopped 06/22/18 1128)  ondansetron (ZOFRAN) injection 4 mg (4 mg Intravenous Given 06/22/18 1017)  iopamidol (ISOVUE-300) 61 % injection 100 mL (100 mLs Intravenous Contrast Given 06/22/18 1113)  oseltamivir (TAMIFLU) capsule 75 mg (75 mg Oral Given 06/22/18 1348)  sodium chloride 0.9 % bolus 2,000 mL (1,000 mLs Intravenous New Bag/Given 06/22/18 1343)    Mobility ambulatory

## 2018-06-23 LAB — BASIC METABOLIC PANEL
Anion gap: 9 (ref 5–15)
BUN: 19 mg/dL (ref 8–23)
CHLORIDE: 100 mmol/L (ref 98–111)
CO2: 27 mmol/L (ref 22–32)
Calcium: 8.3 mg/dL — ABNORMAL LOW (ref 8.9–10.3)
Creatinine, Ser: 0.88 mg/dL (ref 0.61–1.24)
GFR calc Af Amer: >60 mL/min (ref >60)
GFR calc non Af Amer: >60 mL/min (ref >60)
Glucose, Bld: 105 mg/dL — ABNORMAL HIGH (ref 70–99)
Potassium: 3.8 mmol/L (ref 3.5–5.1)
Sodium: 136 mmol/L (ref 135–145)

## 2018-06-23 LAB — CBC
HCT: 37.1 % — ABNORMAL LOW (ref 39.0–52.0)
Hemoglobin: 11.7 g/dL — ABNORMAL LOW (ref 13.0–17.0)
MCH: 24 pg — ABNORMAL LOW (ref 26.0–34.0)
MCHC: 31.5 g/dL (ref 30.0–36.0)
MCV: 76.2 fL — ABNORMAL LOW (ref 80.0–100.0)
NRBC: 0 % (ref 0.0–0.2)
Platelets: 111 10*3/uL — ABNORMAL LOW (ref 150–400)
RBC: 4.87 MIL/uL (ref 4.22–5.81)
RDW: 17.2 % — ABNORMAL HIGH (ref 11.5–15.5)
WBC: 4.2 10*3/uL (ref 4.0–10.5)

## 2018-06-23 LAB — PROCALCITONIN: PROCALCITONIN: 0.13 ng/mL

## 2018-06-23 LAB — GLUCOSE, CAPILLARY
GLUCOSE-CAPILLARY: 125 mg/dL — AB (ref 70–99)
Glucose-Capillary: 105 mg/dL — ABNORMAL HIGH (ref 70–99)
Glucose-Capillary: 111 mg/dL — ABNORMAL HIGH (ref 70–99)
Glucose-Capillary: 188 mg/dL — ABNORMAL HIGH (ref 70–99)

## 2018-06-23 MED ORDER — LOSARTAN POTASSIUM 50 MG PO TABS
50.0000 mg | ORAL_TABLET | Freq: Every day | ORAL | Status: DC
  Administered 2018-06-23 – 2018-06-24 (×2): 50 mg via ORAL
  Filled 2018-06-23 (×2): qty 1

## 2018-06-23 MED ORDER — AZITHROMYCIN 500 MG PO TABS
250.0000 mg | ORAL_TABLET | Freq: Every day | ORAL | Status: DC
  Administered 2018-06-23 – 2018-06-24 (×2): 250 mg via ORAL
  Filled 2018-06-23 (×2): qty 1

## 2018-06-23 MED ORDER — METHYLPREDNISOLONE SODIUM SUCC 125 MG IJ SOLR
60.0000 mg | INTRAMUSCULAR | Status: DC
  Administered 2018-06-23: 13:00:00 60 mg via INTRAVENOUS
  Filled 2018-06-23: qty 2

## 2018-06-23 MED ORDER — SODIUM CHLORIDE 0.9 % IV SOLN
INTRAVENOUS | Status: DC | PRN
  Administered 2018-06-23: 13:00:00 500 mL via INTRAVENOUS

## 2018-06-23 NOTE — Progress Notes (Signed)
PHARMACIST - PHYSICIAN COMMUNICATION DR: Darvin Neighbours CONCERNING: Antibiotic IV to Oral Route Change Policy  RECOMMENDATION: This patient is receiving azithromycin by the intravenous route.  Based on criteria approved by the Pharmacy and Therapeutics Committee, the antibiotic(s) is/are being converted to the equivalent oral dose form(s).   DESCRIPTION: These criteria include:  Patient being treated for a respiratory tract infection, urinary tract infection, cellulitis or clostridium difficile associated diarrhea if on metronidazole  The patient is not neutropenic and does not exhibit a GI malabsorption state  The patient is eating (either orally or via tube) and/or has been taking other orally administered medications for a least 24 hours  The patient is improving clinically and has a Tmax < 100.5  If you have questions about this conversion, please contact the Pharmacy Department  []   [phone] )  [facility] [x]   [phone] )  [facility] []   [phone] )  [facility] []   [phone] )  [facility] []   [phone] )  [place], PharmD, BCPS Clinical Pharmacist 06/23/2018 7:37 AM

## 2018-06-23 NOTE — Progress Notes (Signed)
[facility] NAME: Mars Scheaffer    MR#:  [id number]  DATE OF BIRTH:  [birth year]  SUBJECTIVE:  CHIEF COMPLAINT:   Chief Complaint  Patient presents with  . Chest Pain  . Cough   Continues to have cough, shortness of breath and wheezing.  Feels weak. Oxygen saturations 92% on room air today.  Off oxygen.  Afebrile. His girlfriend at bedside. Continued to smoke though he got to the hospital.  REVIEW OF SYSTEMS:    Review of Systems  Constitutional: Positive for malaise/fatigue. Negative for chills and fever.  HENT: Negative for sore throat.   Eyes: Negative for blurred vision, double vision and pain.  Respiratory: Positive for cough, sputum production, shortness of breath and wheezing. Negative for hemoptysis.   Cardiovascular: Negative for chest pain, palpitations, orthopnea and leg swelling.  Gastrointestinal: Negative for abdominal pain, constipation, diarrhea, heartburn, nausea and vomiting.  Genitourinary: Negative for dysuria and hematuria.  Musculoskeletal: Negative for back pain and joint pain.  Skin: Negative for rash.  Neurological: Negative for sensory change, speech change, focal weakness and headaches.  Endo/Heme/Allergies: Does not bruise/bleed easily.  Psychiatric/Behavioral: Negative for depression. The patient is not nervous/anxious.     DRUG ALLERGIES:  No Known Allergies  VITALS:  Blood pressure (!) 149/90, pulse 82, temperature 98.6 F (37 C), temperature source Oral, resp. rate 16, height 6\' 2"  (1.88 m), weight 102.1 kg, SpO2 93 %.  PHYSICAL EXAMINATION:   Physical Exam  GENERAL:  68 y.o.-year-old patient lying in the bed with conversational dyspnea EYES: Pupils equal, round, reactive to light and accommodation. No scleral icterus. Extraocular muscles intact.  HEENT: Head atraumatic, normocephalic. Oropharynx and nasopharynx clear.  NECK:  Supple, no jugular venous distention. No thyroid  enlargement, no tenderness.  LUNGS: Bilateral wheezing and decreased air entry CARDIOVASCULAR: S1, S2 normal. No murmurs, rubs, or gallops.  ABDOMEN: Soft, nontender, nondistended. Bowel sounds present. No organomegaly or mass.  EXTREMITIES: No cyanosis, clubbing or edema b/l.    NEUROLOGIC: Cranial nerves II through XII are intact. No focal Motor or sensory deficits b/l.   PSYCHIATRIC: The patient is alert and oriented x 3.  SKIN: No obvious rash, lesion, or ulcer.   LABORATORY PANEL:   CBC Recent Labs  Lab 06/23/18 0451  WBC 4.2  HGB 11.7*  HCT 37.1*  PLT 111*   ------------------------------------------------------------------------------------------------------------------ Chemistries  Recent Labs  Lab 06/22/18 1013 06/23/18 0451  NA 136 136  K 3.7 3.8  CL 102 100  CO2 26 27  GLUCOSE 100* 105*  BUN 20 19  CREATININE 0.84 0.88  CALCIUM 8.9 8.3*  AST 24  --   ALT 15  --   ALKPHOS 52  --   BILITOT 1.3*  --    ------------------------------------------------------------------------------------------------------------------  Cardiac Enzymes Recent Labs  Lab 06/22/18 1013  TROPONINI <0.03   ------------------------------------------------------------------------------------------------------------------  RADIOLOGY:  Ct Chest W Contrast  Result Date: 06/22/2018 CLINICAL DATA:  Patient complaining of mid chest pain starting last PM, cough with congestion "yellow stuff", states he was up coughing last PM. Right sided abdominal pain x 4 days. 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 Denies hx of chest pain EXAM: CT CHEST, ABDOMEN, AND PELVIS WITH CONTRAST TECHNIQUE: Multidetector CT imaging of the chest, abdomen and pelvis was performed following the standard protocol during bolus administration of intravenous contrast. CONTRAST:  129mL ISOVUE-300 IOPAMIDOL (ISOVUE-300) INJECTION 61% COMPARISON:  03/07/2018 and previous FINDINGS: CT CHEST FINDINGS Cardiovascular:  Biatrial cardiac enlargement. No pericardial effusion. Dilated central pulmonary arteries. Scattered coarse coronary arterial calcifications. Scattered calcified plaque in the thoracic aorta without aneurysm. Mediastinum/Nodes: No hilar or mediastinal adenopathy. 1.8 cm low-attenuation right thyroid lesion. Lungs/Pleura: No pleural effusion. No pneumothorax. Patchy airspace opacities centrally in both lower lobes, new since prior CT chest from 03/03/2017. Chronic coarse pleural based scarring/atelectasis in the posterior right upper lobe. Small subpleural blebs in both upper lobes. Central peribronchial thickening bilaterally. Musculoskeletal: Vertebral endplate spurring at multiple levels in the mid and lower thoracic spine. CT ABDOMEN PELVIS FINDINGS Hepatobiliary: No focal liver abnormality is seen. No gallstones, gallbladder wall thickening, or biliary dilatation. Pancreas: Unremarkable. No pancreatic ductal dilatation or surrounding inflammatory changes. Spleen: Normal in size without focal abnormality. Adrenals/Urinary Tract: Adrenal glands are unremarkable. Kidneys are normal, without renal calculi, focal lesion, or hydronephrosis. Bladder is unremarkable. Stomach/Bowel: Stomach and small bowel are nondistended. Normal appendix. Colon is nondilated, unremarkable. Vascular/Lymphatic: Aortoiliac atheromatous calcified plaque without aneurysm or high-grade stenosis. Retrievable infrarenal IVC filter. No caval thrombus evident. No abdominal or pelvic adenopathy localized. Reproductive: Prostatic enlargement. Other: No ascites. No free air. Musculoskeletal: Spondylitic changes throughout the lumbar spine. Bilateral hip DJD. No fracture or worrisome bone lesion. IMPRESSION: 1. Patchy bilateral lower lobe airspace opacities suggesting pneumonia. 2. Dilated central pulmonary arteries suggesting pulmonary arterial hypertension. 3. Coronary and aortoiliac  atherosclerosis (ICD10-I70.0). 4. Retrievable IVC filter  present since 2014. If caval filtration is no longer clinically indicated, consider IR consultation for filter removal. Electronically Signed   By: Lucrezia Europe M.D.   On: 06/22/2018 11:40   Ct Abdomen Pelvis W Contrast  Result Date: 06/22/2018 CLINICAL DATA:  Patient complaining of mid chest pain starting last PM, cough with congestion "yellow stuff", states he was up coughing last PM. Right sided abdominal pain x 4 days. 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 Denies hx of chest pain EXAM: CT CHEST, ABDOMEN, AND PELVIS WITH CONTRAST TECHNIQUE: Multidetector CT imaging of the chest, abdomen and pelvis was performed following the standard protocol during bolus administration of intravenous contrast. CONTRAST:  125mL ISOVUE-300 IOPAMIDOL (ISOVUE-300) INJECTION 61% COMPARISON:  03/07/2018 and previous FINDINGS: CT CHEST FINDINGS Cardiovascular: Biatrial cardiac enlargement. No pericardial effusion. Dilated central pulmonary arteries. Scattered coarse coronary arterial calcifications. Scattered calcified plaque in the thoracic aorta without aneurysm. Mediastinum/Nodes: No hilar or mediastinal adenopathy. 1.8 cm low-attenuation right thyroid lesion. Lungs/Pleura: No pleural effusion. No pneumothorax. Patchy airspace opacities centrally in both lower lobes, new since prior CT chest from 03/03/2017. Chronic coarse pleural based scarring/atelectasis in the posterior right upper lobe. Small subpleural blebs in both upper lobes. Central peribronchial thickening bilaterally. Musculoskeletal: Vertebral endplate spurring at multiple levels in the mid and lower thoracic spine. CT ABDOMEN PELVIS FINDINGS Hepatobiliary: No focal liver abnormality is seen. No gallstones, gallbladder wall thickening, or biliary dilatation. Pancreas: Unremarkable. No pancreatic ductal dilatation or surrounding inflammatory changes. Spleen: Normal in size without focal abnormality. Adrenals/Urinary Tract: Adrenal glands are unremarkable.  Kidneys are normal, without renal calculi, focal lesion, or hydronephrosis. Bladder is unremarkable. Stomach/Bowel: Stomach and small bowel are nondistended. Normal appendix. Colon is nondilated, unremarkable. Vascular/Lymphatic: Aortoiliac atheromatous calcified plaque without aneurysm or high-grade stenosis. Retrievable infrarenal IVC filter. No caval thrombus evident. No abdominal or pelvic adenopathy localized. Reproductive: Prostatic enlargement. Other: No ascites. No free air. Musculoskeletal: Spondylitic changes throughout the lumbar spine. Bilateral hip DJD. No fracture or worrisome bone lesion. IMPRESSION: 1. Patchy bilateral lower lobe airspace opacities suggesting pneumonia. 2. Dilated central  pulmonary arteries suggesting pulmonary arterial hypertension. 3. Coronary and aortoiliac  atherosclerosis (ICD10-I70.0). 4. Retrievable IVC filter present since 2014. If caval filtration is no longer clinically indicated, consider IR consultation for filter removal. Electronically Signed   By: Lucrezia Europe M.D.   On: 06/22/2018 11:40     ASSESSMENT AND PLAN:   *Influenza A We will give Tamiflu  Infiltrate on imaging.  Procalcitonin low.  Will stop IV antibiotics.  Due to influenza. Start IV steroids with wheezing. 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 Continue for now. As per CT scan abdomen he has IVC filter still in place. He does not seem to be at high risk of getting recurrent DVT, which would recommend him to discuss this with his primary care physician and decide taking the IVC filter off if not needed any longer.  *Atrial fibrillation Continue metoprolol and Eliquis.  *Gout Continue colchicine.  *Hypertension Continue losartan, Lasix, hydrochlorothiazide, metoprolol.  *Active smoking Counseled to quit smoking on admission  All the records are reviewed and case discussed with Care Management/Social Workerr. Management plans  discussed with the patient, family and they are in agreement.  CODE STATUS: Full code  DVT Prophylaxis: SCDs  TOTAL TIME TAKING CARE OF THIS PATIENT: 35 minutes.   POSSIBLE D/C IN 1-2 DAYS, DEPENDING ON CLINICAL CONDITION.  Leia Alf Karinna Beadles M.D on 06/23/2018 at 12:26 PM  Between 7am to 6pm - Pager - [phone]  After 6pm go to [email or web address] - password [email or web address] [facility]  Office  [phone]  CC: Primary care physician; Theotis Burrow, MD  Note: This dictation was prepared with Dragon dictation along with smaller phrase technology. Any transcriptional errors that result from this process are unintentional.

## 2018-06-23 NOTE — Consult Note (Signed)
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  Seen at wound care center for weekly wraps on Tuesday.  No needs at this time.  Will follow up at wound care center.  Wound type:venous insufficiency/lymphedema/nonitnact lesion Pressure Injury POA: Yes  Dressing procedure/placement/frequency:AS ordered by wound care center.  Weekly wraps on Tuesday.  Will not follow at this time.  Please re-consult if needed.  Domenic Moras MSN, RN, FNP-BC [facility] Wound, Ostomy, Continence Nurse Pager [phone]

## 2018-06-23 NOTE — Progress Notes (Signed)
Pt walks 40 feet around unit with walker. Room air, O2 sats 88-90% HR 90. Pt become tired after 40 feet. Went back to room. o2 sats increased with rest. Notified Dr. Darvin Neighbours. Leave pt on RN

## 2018-06-23 NOTE — Progress Notes (Signed)
[name], [name] ([id number]) Visit Report for 06/20/2018 Arrival Information Details Patient Name: [name], [name] Date of Service: 06/20/2018 8:00 AM Medical Record Number: [id number] Patient Account Number: [id number] Date of Birth/Sex: 1950-06-30 (67 y.o. M) Treating RN: Montey Hora Primary Care Allen Egerton: Royetta Crochet Other Clinician: Referring Deward Sebek: Royetta Crochet Treating Kashia Brossard/Extender: Melburn Hake, HOYT Weeks in Treatment: 33 Visit Information History Since Last Visit Added or deleted any medications: No Patient Arrived: Ambulatory Any new allergies or adverse reactions: No Arrival Time: 08:10 Had a fall or experienced change in No Accompanied By: self activities of daily living that may affect Transfer Assistance: None risk of falls: Patient Identification Verified: Yes Signs or symptoms of abuse/neglect since last visito No Secondary Verification Process Yes Hospitalized since last visit: No Completed: Implantable device outside of the clinic excluding No Patient Requires Transmission-Based No cellular tissue based products placed in the center Precautions: since last visit: Patient Has Alerts: Yes Has Dressing in Place as Prescribed: Yes Patient Alerts: Patient on Blood Pain Present Now: Yes Thinner DMII warfarin Electronic Signature(s) Signed: 06/20/2018 4:14:38 PM By: Lorine Bears RCP, RRT, CHT Entered By: Lorine Bears on 06/20/2018 08:11:10 [name], [name] ([id number]) -------------------------------------------------------------------------------- Encounter Discharge Information Details Patient Name: [name] Date of Service: 06/20/2018 8:00 AM Medical Record Number: [id number] Patient Account Number: [id number] Date of Birth/Sex: 02-Jun-1950 (67 y.o. M) Treating RN: Montey Hora Primary Care Rolly Magri: Royetta Crochet Other Clinician: Referring Gussie Towson: Royetta Crochet Treating Hasnain Manheim/Extender:  Melburn Hake, HOYT Weeks in Treatment: 52 Encounter Discharge Information Items Post Procedure Vitals Discharge Condition: Stable Temperature (F): 97.8 Ambulatory Status: Ambulatory Pulse (bpm): 51 Discharge Destination: Home Respiratory Rate (breaths/min): 16 Transportation: Private Auto Blood Pressure (mmHg): 120/70 Accompanied By: self Schedule Follow-up Appointment: Yes Clinical Summary of Care: Electronic Signature(s) Signed: 06/20/2018 5:09:39 PM By: Gretta Cool, [name], RN, CWS, [name], [name] Entered By: Gretta Cool, [name], RN, CWS, Kim on 06/20/2018 08:47:40 Yambao, [name] ([id number]) -------------------------------------------------------------------------------- Lower Extremity Assessment Details Patient Name: [name] Date of Service: 06/20/2018 8:00 AM Medical Record Number: [id number] Patient Account Number: [id number] Date of Birth/Sex: 1951-05-09 (67 y.o. M) Treating RN: Harold Barban Primary Care Santhiago Collingsworth: Royetta Crochet Other Clinician: Referring Uzziel Russey: Royetta Crochet Treating Hira Trent/Extender: Melburn Hake, HOYT Weeks in Treatment: 34 Edema Assessment Assessed: [Left: No] [Right: No] E[Left: dema] [Right: :] Calf Left: Right: Point of Measurement: 34 cm From Medial Instep cm 37 cm Ankle Left: Right: Point of Measurement: 12 cm From Medial Instep cm 28 cm Vascular Assessment Pulses: Dorsalis Pedis Palpable: [Right:Yes] Posterior Tibial Palpable: [Right:No] Extremity colors, hair growth, and conditions: Extremity Color: [Right:Hyperpigmented] Hair Growth on Extremity: [Right:No] Temperature of Extremity: [Right:Warm] Capillary Refill: [Right:< 3 seconds] Toe Nail Assessment Left: Right: Thick: Yes Discolored: Yes Deformed: Yes Improper Length and Hygiene: No Electronic Signature(s) Signed: 06/22/2018 11:43:02 AM By: Harold Barban Entered By: Harold Barban on 06/20/2018 08:28:29 [name], [name]  ([id number]) -------------------------------------------------------------------------------- Multi Wound Chart Details Patient Name: [name] Date of Service: 06/20/2018 8:00 AM Medical Record Number: [id number] Patient Account Number: [id number] Date of Birth/Sex: March 02, 1951 (67 y.o. 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] [N/A:N/A] Secondary Etiology: [8:Lymphedema] [N/A:N/A] Comorbid History: [8:Chronic  Obstructive Pulmonary Disease (COPD), Arrhythmia, Deep Vein Thrombosis, Hypertension, Type II Diabetes, Gout, Neuropathy] [N/A:N/A] Date Acquired: [8:10/17/2017] [N/A:N/A] Weeks of Treatment: [8:34] [N/A:N/A] Wound Status: [8:Open] [N/A:N/A] Measurements L x W x D [8:0.1x0.1x0.1] [N/A:N/A] (cm) Area (cm) : [8:0.008] [N/A:N/A] Volume (cm) : [8:0.001] [N/A:N/A] % Reduction in Area: [8:95.20%] [N/A:N/A] % Reduction in Volume: [8:93.80%] [N/A:N/A] Classification: [8:Grade 2] [N/A:N/A] Exudate Amount: [8:Small] [N/A:N/A] Exudate Type: [8:Serous] [N/A:N/A] Exudate Color: [8:amber] [N/A:N/A] Wound Margin: [8:Flat and Intact] [N/A:N/A] Granulation Amount: [8:Large (67-100%)] [N/A:N/A] Granulation Quality: [8:Red, Pink] [N/A:N/A] Necrotic Amount: [8:Small (1-33%)] [N/A:N/A] Exposed Structures: [8:Fat Layer (Subcutaneous Tissue) Exposed: Yes Fascia: No Tendon: No Muscle: No Joint: No Bone: No] [N/A:N/A] Epithelialization: Medium (34-66%) N/A  N/A Periwound Skin Texture: Scarring: Yes N/A N/A Excoriation: No Induration: No Callus: No Crepitus: No Rash: No Periwound Skin Moisture: Maceration: No N/A N/A Dry/Scaly: No Periwound Skin Color: Hemosiderin Staining: Yes N/A N/A Atrophie Blanche: No Cyanosis: No Ecchymosis: No Erythema: No Mottled: No Pallor: No Rubor: No Temperature: No Abnormality N/A N/A Tenderness on Palpation: Yes N/A N/A Wound Preparation: Ulcer Cleansing: N/A N/A Rinsed/Irrigated with Saline, Other: soap and water Topical Anesthetic Applied: Other: lidocaine 4% Treatment Notes Electronic Signature(s) Signed: 06/20/2018 4:52:45 PM By: Montey Hora Entered By: Montey Hora on 06/20/2018 08:35:13 [name], [name] ([id number]) -------------------------------------------------------------------------------- Multi-Disciplinary Care Plan Details Patient Name: [name] Date of Service: 06/20/2018 8:00 AM Medical Record Number: [id number] Patient Account Number: [id number] Date of Birth/Sex: 07-Aug-1950 (67 y.o. M) Treating RN: Montey Hora Primary Care Vasilia Dise: Royetta Crochet Other Clinician: Referring Alexandrina Fiorini: Royetta Crochet Treating Radley Teston/Extender: Melburn Hake, HOYT Weeks in Treatment: 19 Active Inactive Abuse / Safety / Falls / Self Care Management Nursing Diagnoses: Potential for falls Goals: Patient will not experience any injury related to falls Date Initiated: 10/25/2017 Target Resolution Date: 02/18/2018 Goal Status: Active Interventions: Assess Activities of Daily Living upon admission and as needed Assess fall risk on admission and as needed Assess: immobility, friction, shearing, incontinence upon admission and as needed Assess impairment of mobility on admission and as needed per policy Assess personal safety and home safety (as indicated) on admission and as needed Assess self care needs on admission and as needed Notes: Nutrition Nursing Diagnoses: Imbalanced  nutrition Impaired glucose control: actual or potential Potential for alteratiion in Nutrition/Potential for imbalanced nutrition Goals: Patient/caregiver agrees to and verbalizes understanding of need to use nutritional supplements and/or vitamins as prescribed Date Initiated: 10/25/2017 Target Resolution Date: 02/18/2018 Goal Status: Active Patient/caregiver will maintain therapeutic glucose control Date Initiated: 10/25/2017 Target Resolution Date: 02/18/2018 Goal Status: Active Interventions: Assess patient nutrition upon admission and as needed per policy Provide education on elevated blood sugars and impact on wound healing Provide education on nutrition [name], [name] ([id number]) Notes: Orientation to the Wound Care Program Nursing Diagnoses: Knowledge deficit related to the wound healing center program Goals: Patient/caregiver will verbalize understanding of the [facility] Program Date Initiated: 10/25/2017 Target Resolution Date: 11/19/2017 Goal Status: Active Interventions: Provide education on orientation to the wound center Notes: Wound/Skin Impairment Nursing Diagnoses: Impaired tissue integrity Knowledge deficit related to ulceration/compromised skin integrity Goals: Ulcer/skin breakdown will have a volume reduction of 80% by week 12 Date Initiated: 10/25/2017 Target Resolution Date: 02/11/2018 Goal Status: Active Interventions: Assess patient/caregiver ability to perform ulcer/skin care regimen upon admission and as needed Assess ulceration(s) every visit Notes: Electronic Signature(s) Signed: 06/20/2018 4:52:45 PM By: Montey Hora Entered By: Montey Hora on 06/20/2018 08:35:06 [name], [name] ([id number]) -------------------------------------------------------------------------------- Pain Assessment Details Patient Name: [name] Date of  Service: 06/20/2018 8:00 AM Medical Record Number: [id number] Patient Account Number:  [id number] Date of Birth/Sex: [birth year] (67 y.o. M) Treating RN: Montey Hora Primary Care Elizabeth Paulsen: Royetta Crochet Other Clinician: Referring Eudell Julian: Royetta Crochet Treating Valisha Heslin/Extender: STONE III, HOYT Weeks in Treatment: 34 Active Problems Location of Pain Severity and Description of Pain Patient Has Paino Yes Site Locations Rate the pain. Current Pain Level: 5 Pain Management and Medication Current Pain Management: Electronic Signature(s) Signed: 06/20/2018 4:14:38 PM By: Lorine Bears RCP, RRT, CHT Signed: 06/20/2018 4:52:45 PM By: Montey Hora Entered By: Lorine Bears on 06/20/2018 08:11:20 [name], [name] ([id number]) -------------------------------------------------------------------------------- Patient/Caregiver Education Details Patient Name: [name] Date of Service: 06/20/2018 8:00 AM Medical Record Number: [id number] Patient Account Number: [id number] Date of Birth/Gender: 1951/02/09 (67 y.o. M) Treating RN: Montey Hora Primary Care Physician: Royetta Crochet Other Clinician: Referring Physician: Royetta Crochet Treating Physician/Extender: Sharalyn Ink in Treatment: 24 Education Assessment Education Provided To: Patient Education Topics Provided Venous: Handouts: Other: leg elevation Methods: Explain/Verbal Responses: State content correctly Electronic Signature(s) Signed: 06/20/2018 4:52:45 PM By: Montey Hora Entered By: Montey Hora on 06/20/2018 08:39:21 [name], [name] ([id number]) -------------------------------------------------------------------------------- Wound Assessment Details Patient Name: [name] Date of Service: 06/20/2018 8:00 AM Medical Record Number: [id number] Patient Account Number: [id number] Date of Birth/Sex: 13-Oct-1950 (67 y.o. M) Treating RN: Harold Barban Primary Care Chasidy Janak: Royetta Crochet Other Clinician: Referring Lavenia Stumpo: Royetta Crochet Treating Balin Vandegrift/Extender: STONE III, HOYT Weeks in Treatment: 34 Wound Status Wound Number: 8 Primary Diabetic Wound/Ulcer of the Lower Extremity Etiology: Wound Location: Right Lower Leg - Medial, Proximal Secondary Lymphedema Wounding Event: Gradually Appeared Etiology: Date Acquired: 10/17/2017 Wound Open Weeks Of Treatment: 34 Status: Clustered Wound: No Comorbid Chronic Obstructive Pulmonary Disease History: (COPD), Arrhythmia, Deep Vein Thrombosis, Hypertension, Type II Diabetes, Gout, Neuropathy Photos Photo Uploaded By: Harold Barban on 06/20/2018 12:19:43 Wound Measurements Length: (cm) 0.1 Width: (cm) 0.1 Depth: (cm) 0.1 Area: (cm) 0.008 Volume: (cm) 0.001 % Reduction in Area: 95.2% % Reduction in Volume: 93.8% Epithelialization: Medium (34-66%) Tunneling: No Undermining: No Wound Description Classification: Grade 2 Wound Margin: Flat and Intact Exudate Amount: Small Exudate Type: Serous Exudate Color: amber Foul Odor After Cleansing: No Slough/Fibrino Yes Wound Bed Granulation Amount: Large (67-100%) Exposed Structure Granulation Quality: Red, Pink Fascia Exposed: No Necrotic Amount: Small (1-33%) Fat Layer (Subcutaneous Tissue) Exposed: Yes Necrotic Quality: Adherent Slough Tendon Exposed: No Muscle Exposed: No Joint Exposed: No Clucas, Haedyn ([id number]) Bone Exposed: No Periwound Skin Texture Texture Color No Abnormalities Noted: No No Abnormalities Noted: No Callus: No Atrophie Blanche: No Crepitus: No Cyanosis: No Excoriation: No Ecchymosis: No Induration: No Erythema: No Rash: No Hemosiderin Staining: Yes Scarring: Yes Mottled: No Pallor: No Moisture Rubor: No No Abnormalities Noted: No Dry / Scaly: No Temperature / Pain Maceration: No Temperature: No Abnormality Tenderness on Palpation: Yes Wound Preparation Ulcer Cleansing: Rinsed/Irrigated with Saline, Other: soap and water, Topical Anesthetic  Applied: Other: lidocaine 4%, Treatment Notes Wound #8 (Right, Proximal, Medial Lower Leg) Notes xeroform, non adherent pad, unna to anchor and 3 layer wrap Electronic Signature(s) Signed: 06/22/2018 11:43:02 AM By: Harold Barban Entered By: Harold Barban on 06/20/2018 08:25:19 [name], [name] ([id number]) -------------------------------------------------------------------------------- Vitals Details Patient Name: [name] Date of Service: 06/20/2018 8:00 AM Medical Record Number: [id number] Patient Account Number: [id number] Date of Birth/Sex: 06/01/1950 (68 y.o. M) Treating RN: Montey Hora Primary Care Rori Goar: Royetta Crochet Other Clinician: Referring Emran Molzahn: Royetta Crochet Treating Melissaann Dizdarevic/Extender: STONE III, HOYT Weeks in Treatment: 34 Vital Signs Time Taken:  08:11 Temperature (F): 97.8 Height (in): 74 Pulse (bpm): 51 Weight (lbs): 220 Respiratory Rate (breaths/min): 16 Body Mass Index (BMI): 28.2 Blood Pressure (mmHg): 120/70 Reference Range: 80 - 120 mg / dl Airway Electronic Signature(s) Signed: 06/20/2018 4:14:38 PM By: Lorine Bears RCP, RRT, CHT Entered By: Lorine Bears on 06/20/2018 08:15:14

## 2018-06-24 LAB — GLUCOSE, CAPILLARY
GLUCOSE-CAPILLARY: 85 mg/dL (ref 70–99)
Glucose-Capillary: 109 mg/dL — ABNORMAL HIGH (ref 70–99)

## 2018-06-24 LAB — URINE CULTURE: Culture: <10000 — AB

## 2018-06-24 MED ORDER — IPRATROPIUM-ALBUTEROL 0.5-2.5 (3) MG/3ML IN SOLN: 3.0000 mL | Freq: Three times a day (TID) | RESPIRATORY_TRACT | Status: DC

## 2018-06-24 MED ORDER — HYDROCODONE-ACETAMINOPHEN 5-325 MG PO TABS: 2.0000 | ORAL_TABLET | Freq: Four times a day (QID) | ORAL | 0 refills | Status: AC | PRN

## 2018-06-24 MED ORDER — APIXABAN 5 MG PO TABS: 5.0000 mg | ORAL_TABLET | Freq: Two times a day (BID) | ORAL | 0 refills | Status: DC

## 2018-06-24 MED ORDER — FUROSEMIDE 40 MG PO TABS: 40.0000 mg | ORAL_TABLET | Freq: Every day | ORAL | 0 refills | Status: DC

## 2018-06-24 MED ORDER — PREDNISONE 10 MG PO TABS: 10.0000 mg | ORAL_TABLET | Freq: Every day | ORAL | 0 refills | Status: DC

## 2018-06-24 MED ORDER — OSELTAMIVIR PHOSPHATE 75 MG PO CAPS: 75.0000 mg | ORAL_CAPSULE | Freq: Two times a day (BID) | ORAL | 0 refills | Status: AC

## 2018-06-24 NOTE — Progress Notes (Signed)
Reviewed AVS with pt, pt verbalized understanding. 2 printed RX with the AVS

## 2018-06-24 NOTE — Discharge Summary (Signed)
[facility] NAME: [name]    MR#:  [id number]  DATE OF BIRTH:  12-Jul-1950  DATE OF ADMISSION:  06/22/2018 ADMITTING PHYSICIAN: Vaughan Basta, MD  DATE OF DISCHARGE: 06/24/2018  PRIMARY CARE PHYSICIAN: Theotis Burrow, MD   ADMISSION DIAGNOSIS:  Influenza A [J10.1] Pneumonia of both lower lobes due to infectious organism ([place]) [J18.1] Sepsis with acute hypoxic respiratory failure without septic shock, due to unspecified organism ([place]) [A41.9, R65.20, J96.01]  DISCHARGE DIAGNOSIS:  Influenza A infection Tobacco abuse History of DVT and palm embolism Chronic atrial fibrillation Gout Hypertension 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 Atrial fibrillation ([place])   . Cocaine abuse ([place])    + UDS on admission  . Diabetes mellitus without complication ([place])   . Gout current and history of  . History of deep vein thrombosis 2006  . History of tobacco abuse    has smoked for 50 years  . Hypertension   . Pulmonary embolism ([place])   . Stroke [facility])      ADMITTING HISTORY  [name]  is a 68 y.o. male with a known history of atrial fibrillation, diabetes, gout, DVT, hypertension, pulmonary embolism, stroke-was feeling short of breath with cough and yellowish sputum production with some fever and chills for last 3 days.  Emergency room noted to have influenza A positive and also had pneumonia noted on chest CT.Concerned with this ER physician suggested to admit to hospitalist service. He has generalized weakness and shortness of breath on exertion and could not perform his day-to-day activities so he need to be admitted.  HOSPITAL COURSE:  Patient was admitted to medical floor started on Tamiflu.  He was also started on IV Rocephin and Zithromax antibiotic for possible pneumonia.  Cultures did not reveal any growth and  procalcitonin was low and antibiotics were stopped.  Eliquis was started for anticoagulation.  Metoprolol was continued for rate control.  Tobacco cessation was counseled to the patient and nicotine patch offered.  Patient's shortness of breath and cough resolved.  He felt much better and was weaned off oxygen.  He will be discharged home on Tamiflu.  And patient will follow-up in wound care center for his nonhealing right lower extremity venous wound ulcer.  CONSULTS OBTAINED:    DRUG ALLERGIES:  No Known Allergies  DISCHARGE MEDICATIONS:   Allergies as of 06/24/2018   No Known Allergies     Medication List    STOP taking these medications   hydrochlorothiazide 12.5 MG tablet Commonly known as:  HYDRODIURIL   ibuprofen 200 MG tablet Commonly known as:  ADVIL,MOTRIN     TAKE these medications   apixaban 5 MG Tabs tablet Commonly known as:  ELIQUIS Take 1 tablet (5 mg total) by mouth 2 (two) times daily for 30 days.   carisoprodol 350 MG tablet Commonly known as:  SOMA Take 1 tablet (350 mg total) by mouth 3 (three) times daily as needed.   colchicine 0.6 MG tablet Take 0.6 mg by mouth daily.   diazepam 5 MG tablet Commonly known as:  VALIUM Take 1 tablet (5 mg total) by mouth every 8 (eight) hours as needed for anxiety.   furosemide 40 MG tablet Commonly known as:  LASIX Take 1 tablet (40 mg total) by mouth daily for 30 days. What changed:  when to take this   HYDROcodone-acetaminophen 5-325 MG tablet Commonly  known as:  NORCO/VICODIN Take 2 tablets by mouth every 6 (six) hours as needed for up to 5 days for moderate pain. What changed:   how much to take when to take this reasons to take this   Iron 28 MG Tabs Take 1 tablet by mouth daily.   lactulose 10 GM/15ML solution Commonly known as:  CHRONULAC Take 30 mLs (20 g total) by mouth daily as needed for mild constipation.   losartan 50 MG tablet Commonly known as:  COZAAR Take 50 mg by mouth daily.    metFORMIN 500 MG tablet Commonly known as:  GLUCOPHAGE Take 500 mg by mouth 2 (two) times daily with a meal.   metoprolol tartrate 25 MG tablet Commonly known as:  LOPRESSOR Take 25 mg by mouth 2 (two) times daily.   oseltamivir 75 MG capsule Commonly known as:  TAMIFLU Take 1 capsule (75 mg total) by mouth 2 (two) times daily for 3 days.   potassium chloride 10 MEQ tablet Commonly known as:  K-DUR Take 10 mEq by mouth daily.   predniSONE 10 MG tablet Commonly known as:  DELTASONE Take 1 tablet (10 mg total) by mouth daily. Label  & dispense according to the schedule below.  6 tablets day one, then 5 table day 2, then 4 tablets day 3, then 3 tablets day 4, 2 tablets day 5, then 1 tablet day 6, then stop   simvastatin 20 MG tablet Commonly known as:  ZOCOR Take 20 mg by mouth at bedtime.       Today  Patient seen and evaluated today No shortness of breath No cough No fever Hemodynamically stable  VITAL SIGNS:  Blood pressure 129/85, pulse 68, temperature 97.8 F (36.6 C), temperature source Oral, resp. rate 15, height 6\' 2"  (1.88 m), weight 102.1 kg, SpO2 96 %.  I/O:    Intake/Output Summary (Last 24 hours) at 06/24/2018 1146 Last data filed at 06/24/2018 0800 Gross per 24 hour  Intake 821.44 ml  Output 1350 ml  Net -528.56 ml    PHYSICAL EXAMINATION:  Physical Exam  GENERAL:  68 y.o.-year-old patient lying in the bed with no acute distress.  LUNGS: Normal breath sounds bilaterally, no wheezing, rales,rhonchi or crepitation. No use of accessory muscles of respiration.  CARDIOVASCULAR: S1, S2 normal. No murmurs, rubs, or gallops.  ABDOMEN: Soft, non-tender, non-distended. Bowel sounds present. No organomegaly or mass.  NEUROLOGIC: Moves all 4 extremities. PSYCHIATRIC: The patient is alert and oriented x 3.  SKIN: No obvious rash, lesion, or ulcer.   DATA REVIEW:   CBC Recent Labs  Lab 06/23/18 0451  WBC 4.2  HGB 11.7*  HCT 37.1*  PLT 111*     Chemistries  Recent Labs  Lab 06/22/18 1013 06/23/18 0451  NA 136 136  K 3.7 3.8  CL 102 100  CO2 26 27  GLUCOSE 100* 105*  BUN 20 19  CREATININE 0.84 0.88  CALCIUM 8.9 8.3*  AST 24  --   ALT 15  --   ALKPHOS 52  --   BILITOT 1.3*  --     Cardiac Enzymes Recent Labs  Lab 06/22/18 1013  TROPONINI <0.03    Microbiology Results  Results for orders placed or performed during the hospital encounter of 06/22/18  Blood Culture (routine x 2)     Status: None (Preliminary result)   Collection Time: 06/22/18  1:25 PM  Result Value Ref Range Status   Specimen Description BLOOD BLOOD RIGHT HAND  Final  Special Requests   Final    BOTTLES DRAWN AEROBIC AND ANAEROBIC Blood Culture results may not be optimal due to an inadequate volume of blood received in culture bottles   Culture   Final    NO GROWTH 2 DAYS Performed at [facility], [address], [place], [place] [address]    Report Status PENDING  Incomplete  Blood Culture (routine x 2)     Status: None (Preliminary result)   Collection Time: 06/22/18  1:25 PM  Result Value Ref Range Status   Specimen Description BLOOD LEFT ANTECUBITAL  Final   Special Requests   Final    BOTTLES DRAWN AEROBIC AND ANAEROBIC Blood Culture results may not be optimal due to an inadequate volume of blood received in culture bottles   Culture   Final    NO GROWTH 2 DAYS Performed at [facility], [address], [place], [place] [address]    Report Status PENDING  Incomplete  Urine culture     Status: Abnormal   Collection Time: 06/22/18  1:55 PM  Result Value Ref Range Status   Specimen Description URINE, RANDOM  Final   Special Requests   Final    NONE Performed at [facility], [address] [place], [place] [address]    Culture <10,000 COLONIES/mL INSIGNIFICANT GROWTH (A)  Final   Report Status 06/24/2018 FINAL  Final    RADIOLOGY:  No results found.  Follow up with PCP in 1  week.  Management plans discussed with the patient, family and they are in agreement.  CODE STATUS: Full code    Code Status Orders  (From admission, onward)         Start     Ordered   06/22/18 1458  Full code  Continuous     06/22/18 1457        Code Status History    Date Active Date Inactive Code Status Order ID Comments User Context   03/03/2017 1541 03/04/2017 1545 Full Code [id number]  Demetrios Loll, MD Inpatient   10/03/2016 1535 10/05/2016 1535 Full Code [id number]  [name], MD Inpatient   10/12/2014 0953 10/14/2014 1514 Full Code [id number]  Gladstone Lighter, MD Inpatient      TOTAL TIME TAKING CARE OF THIS PATIENT ON DAY OF DISCHARGE: more than 35 minutes.   Saundra Shelling M.D on 06/24/2018 at 11:46 AM  Between 7am to 6pm - Pager - [phone]  After 6pm go to [email or web address] - password [email or web address] [facility]  Office  [phone]  CC: Primary care physician; Theotis Burrow, MD  Note: This dictation was prepared with Dragon dictation along with smaller phrase technology. Any transcriptional errors that result from this process are unintentional.

## 2018-06-26 LAB — HIV ANTIBODY (ROUTINE TESTING W REFLEX): HIV Screen 4th Generation wRfx: NONREACTIVE

## 2018-06-27 ENCOUNTER — Encounter: Payer: Medicare Other | Admitting: Physician Assistant

## 2018-06-27 DIAGNOSIS — I4891 Unspecified atrial fibrillation: Secondary | ICD-10-CM | POA: Diagnosis not present

## 2018-06-27 DIAGNOSIS — I872 Venous insufficiency (chronic) (peripheral): Secondary | ICD-10-CM | POA: Diagnosis present

## 2018-06-27 DIAGNOSIS — I890 Lymphedema, not elsewhere classified: Secondary | ICD-10-CM | POA: Diagnosis not present

## 2018-06-27 DIAGNOSIS — Z86718 Personal history of other venous thrombosis and embolism: Secondary | ICD-10-CM | POA: Diagnosis not present

## 2018-06-27 DIAGNOSIS — Z7901 Long term (current) use of anticoagulants: Secondary | ICD-10-CM | POA: Diagnosis not present

## 2018-06-27 DIAGNOSIS — Z8673 Personal history of transient ischemic attack (TIA), and cerebral infarction without residual deficits: Secondary | ICD-10-CM | POA: Diagnosis not present

## 2018-06-27 DIAGNOSIS — E1140 Type 2 diabetes mellitus with diabetic neuropathy, unspecified: Secondary | ICD-10-CM | POA: Diagnosis not present

## 2018-06-27 DIAGNOSIS — F17210 Nicotine dependence, cigarettes, uncomplicated: Secondary | ICD-10-CM | POA: Diagnosis not present

## 2018-06-27 DIAGNOSIS — Z09 Encounter for follow-up examination after completed treatment for conditions other than malignant neoplasm: Secondary | ICD-10-CM | POA: Diagnosis not present

## 2018-06-27 DIAGNOSIS — Z86711 Personal history of pulmonary embolism: Secondary | ICD-10-CM | POA: Diagnosis not present

## 2018-06-27 DIAGNOSIS — J449 Chronic obstructive pulmonary disease, unspecified: Secondary | ICD-10-CM | POA: Diagnosis not present

## 2018-06-27 DIAGNOSIS — I10 Essential (primary) hypertension: Secondary | ICD-10-CM | POA: Diagnosis not present

## 2018-06-27 DIAGNOSIS — Z8249 Family history of ischemic heart disease and other diseases of the circulatory system: Secondary | ICD-10-CM | POA: Diagnosis not present

## 2018-06-27 LAB — CULTURE, BLOOD (ROUTINE X 2)
CULTURE: NO GROWTH
Culture: NO GROWTH

## 2018-07-02 NOTE — Progress Notes (Signed)
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 application of TheraSkin. I elected not to put that TheraSkin on today. 09/21/16; deterioration in the wound last week. I gave him empiric Cefdinir out of  fear for a gram-negative infection although the CULTURE turned out to be negative. He completed his antibiotics this morning. Wound looks somewhat better, I put silver alginate on it last week again out of concern for infection. We do not have a TheraSkin this week not ordered last week 09/28/16; no major change from last week. We've looked over the volume of this wound and of not had major changes in spite of TheraSkin although the last TheraSkin was almost a month ago. We put silver alginate on last week out of fear of infection. I will switch to [facility] as looking over the records didn't really suggest that theraskin had helped 10/12/16; we'll use Hydrofera Blue starting last week. No major change in the wound dimensions. 10/19/16; continue Hydrofera Blue. 1.8 x 2.5 x 0.2. Wound base looks healthy 10/26/16; continue with Hydrofera Blue. 1.5 x 2.4 x 0.2 11/02/16 no change in dimensions. Change from [facility] to Iodoflex 11/09/16; patient complains of increasing pain. Dimensions slightly larger. Last week I put Iodoflex on this wound to see if we can get a better surface I also increased him to 4 layer compression. He has not been systemically unwell. 11/16/16; patient states his leg feels better. He has completed his antibiotics. Dimensions are better. I change back to [facility] last week. We also change the dressing once on Friday which may have helped. Wound looks a lot better today than last week.. 2.2 x 2.4 x 0.3 11/23/16 on evaluation today patient's right lower extremity ulcer appears to be doing very well. He has been tolerating the [facility] Dressing and tells me that fortunately he is finally improvement. He is pleased with how this is progressing. [name], [name] ([id number]) 11/30/16; improved using Hydrofera Blue 12/07/16; continue dramatic progress. Wound is now small and healthy looking. Continue use of Hydrofera Blue 12/14/16; very small wound albeit with some depth.  Continued use of Hydrofera blue. 12/21/16 on evaluation today patient's right lower extremity ulcer appears healed at this point. He is having no discomfort and overall this is doing very well. And there's no sign of infection. 04/26/17; READMISSION Patient we know from previous stays in this clinic. The patient has known diabetic PAD and has a stent in the right leg. 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 When he was last in the clinic in August we had healed him out for what was felt to be a mostly venous insufficiency wound on the medial right leg. He follows with vascular surgery Dr. dew is at [place] [facility]. He has previously undergone successful laser ablation. Reflux studies on 4/24 showed reflux in the common femoral, femoral and popliteal. He underwent successful ablation of the right greater saphenous vein from the distal thigh to the mid calf level. Successful ablation of the right small saphenous vein. He had unsuccessful ablation of the right greater saphenous vein from the saphenofemoral junction to the distal 5 level. The patient wears to let her compression stockings and he has been compliant with this With regards to his diabetes he is not currently on any treatment. He does describe pain in his leg which forces him to stop although I couldn't really quantify this. His last arterial studies were on 07/05/16 which showed triphasic waveforms on the right to the level of the popliteal artery. The right posterior tibial and anterior tibial artery were not visualized on this study. I don't actually see ABIs or TBIs. 05/04/17; the patient has small wounds on the right medial heel and the right medial leg. The area on the foot is just about closed. The area on the right medial leg is improved. He is tolerating the 3 layer compression without any complaints. The patient has both known PAD and chronic venous reflux [see discussion  above]. 05/11/17; the areas on the patient's medial right heel with healed he still has an open area on the right medial leg. This does not require debridement today I think I did read it last week. We are using silver collagen under 3 layer compression. 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 He has been tolerating the dressing changes without complication. With that being said he does have some epithelialization growing into the wound bed and it appears this wound may be trying to healed in a depressed fashion. To be honest that is not the worst case scenario and I think this may definitely do fine even if it heals in that way. There was no requirement for debridement today as patient appears to be doing very well in regard to his ulcer. His swelling was a little bit more severe than last week but nothing too significant. 07/05/17 on evaluation today patient appears to be doing rather well in regard to his right medial lower extremity ulcer. The wound does appear to be healing although it is healing and somewhat of a cratered fashion the good news is he has excellent epithelium noted. Fortunately there does not appear to be any evidence of infection at this point he is tolerating the dressings as well is the wrap well. 07/12/17 on evaluation today patient appears to be doing very well in regard to his left medial lower extremity ulcer. He has been tolerating the dressing changes without complication. In fact compared to last week this wound which was somewhat concave has fielding dramatically and looks to be doing excellent. I'm extremely happy with the progress. He still has pain rated to be a 4-5/10 but the wound looks great. 07/20/17; the patient's right medial lower extremity ulcer is closed. He has good edema control.  Unfortunately he does not really have adequate compression stockings. Certainly  what he brought in the blood on his right leg might of been support hose however there is whole in the stocking already Readmission: 10/25/17 patient seen today for reevaluation due to a recurrence of the right medial ankle venous leg ulcer. 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 He has been treated with compression stockings for some time now in fact we have notes having treated him even as far back as 2016 here in our clinic. Fortunately he has excellent blood flow and tends to heal well. Unfortunately despite wearing his compression stockings, keeping his legs elevated, being on fluid pills (Lasix) and doing everything he can to try to help with fluid he still continues to have issues with this reopening. I think that he may be a good candidate for compression/lymphedema pumps. We have never attempted to get these for him previously. No fevers, chills, nausea, or vomiting noted at this time. Currently patient states his ulcer does hurt although not as bad as some in the past it's also not as large as it has been in the past when it reopened he talked is somewhat early. Obviously this is good news. [name], [name] ([id number]) 12/13/17 on evaluation today patient appears to actually be doing very well in regard to his right medial ankle ulcer compared to last week's evaluation. I think having him on the correct antibiotics has made all the difference in the world. With that being said he's not having discomfort like he was previous and in general I feel like he has made excellent strides towards healing in the past week. No fevers, chills, nausea, or vomiting noted at this time. 01/10/18 on evaluation today patient appears to be doing a little worse compared to last week's evaluation. I think that though the Iodoflex has help with cleaning up  the surface of the wound subsequently he has having a lot of drainage which I think is causing some issues in that regard. At this point I think we may want to switch the dressings. 01/17/18 on evaluation today patient appears to be doing rather well in regard to his right medial malleolus ulcer. The good news is I do feel like he showing some signs of improvement which is excellent. I feel like that the [facility] Dressing been better for him than the Iodoflex that we will previously utilizing. He is having much less in the way of discomfort which is good news. Overall very pleased in that regard. Nonetheless the appearance of the wound is improved today as well and that is excellent news. He still has pain but not seemingly as much as previous and I don't feel like there's much maceration either. 01/23/18 on evaluation today patient appears in our clinic a day early for his appointment due to the fact that he was having discomfort and issues with his wrap. Upon inspection of the wound actually appears to be doing fairly well which is good news. With that being said he is having a lot of discomfort at this point unfortunately. I think that this is mainly due to the fact that he is having such drainage as the wound bed itself seems to be doing well. I do believe the [facility] Dressing well is helpful for him which is good news. Nonetheless again I think he may need more frequent dressing changes. 01/31/18 on evaluation today patient actually appears to be doing very well in regard to his right medial ankle ulcer.  This is significantly better than what was even noted last week I do think that the extra dressing change with the nurse visit toward the end of the week made a significant improvement for him. Overall I'm very pleased with how things appear today. 02/07/18 on evaluation today patient actually appears to be doing a little better in regard to his wound. The measurements are smaller today  compared to previous. Fortunately there's no evidence of any infection and I do believe the moisture is much better control with more frequent wrap changes. Overall the patient seems to be making good progress. 02/14/18 on evaluation today patient actually appears to be doing very well in regard to his medial ankle ulcer of the right lower extremity. With that being said he saw slough buildup and this is still been somewhat slow to heal. For that reason my suggestion currently is gonna be that we may need to see about checking on a skin substitute possibly Apligraf to see if this will be of benefit for him. In the past he has benefited from Apligraf. Overall there does not appear to be any evidence of infection at this time which is definitely good news. 02/28/18 on evaluation today patient's medial ankle ulcer on the right actually appears to be doing significantly better compared to previous. He has been tolerating the dressing changes without complication. Fortunately there appears to be no evidence of infection which is great news. Overall I do feel like that he has made improvement although there has been some delay in general and the wound is taken a little longer than I would've liked up to this point. Nonetheless there's no sign of infection and therefore I think that we can proceed with the Apligraf applications we actually got this approved for him. That something that I'm gonna see about ordering for next week. 03/07/18 on evaluation today patient unfortunately appears to be doing significantly worse than normal as far as his overall and general appearance is concerned. He has been tolerating the dressing changes without complication. With that being said nothing appears to be problematic in regard to his wound. The issue that I see right now involves more his general malaise and the fact that he does appear to have atrial fibrillation which is a known issue but unfortunately he is dropping  low to the point of having 3-5 seconds of sustained pulses that are around 30 bpm. This was confirmed actually during evaluation today by myself. He's been having left lower quadrant pain and was actually in the emergency department on 17 October most recently. According to the notes reviewed it did not appear that anything was identified as far as a cause of the reason for this. It does not appear to the wound related which is good news. Nonetheless still I'm unsure of exactly what is going on but I am worried on top of everything else and the patient feeling lethargic he showed up late for his appointment today which is very uncharacteristic for this gentleman. 03/15/18; the patient was sent to the ER last week during his evaluation but he was not admitted. I have not reviewed the emergency room record. He states he feels better. He is here for application of Apligraf #1. The Apligraf we had would've expired tomorrow so he was brought in early 03/28/18 on evaluation today patient actually appears to be doing well in my pinion in regard to the overall appearance of his right lower Trinity ulcer. He has been tolerating the dressing changes  without complication. Fortunately there does not appear to be any evidence of infection at this time which is also good news. He does seem to have tolerated the first Apligraf [name], [name] ([id number]) application. He is here for number two today. 04/11/18 upon evaluation today patient actually appears to be doing very well in regard to his right medial malleolus ulcer. He has been tolerating the dressing changes specifically the compression wrap without complication. The Apligraf does seem to be beneficial for him he showing signs of good improvement which is excellent news. Overall I'm very pleased even though the measurements have not dramatically improve the appearance of the wound has. 04/25/18 on evaluation today patient actually appears to be doing  much better in regard to his right medial ankle ulcer. He has been tolerating the dressing changes without complication specifically the Apligraf and the compression wrap. At this point I think he has made great progress even since last week. Fortunately there does not appear to be any evidence of infection at this time. 05/09/18 on evaluation today patient appears to be doing very well in regard to his lower Trinity ulcer. In fact this has made great progress since last time I seen him there's just one small area remaining this is at the top where it was worse. Fortunately there is no sign of infection and I do believe he's making great progress. This is his last application of the Apligraf today. 05/23/18 on evaluation today patient's wound actually appears to be showing signs of good improvement. He has completed his series of Apligraf at this point. All in all I do believe this was of great benefit for him and were very close to seeing this completely resolved. 05/30/18 upon evaluation today patient actually appears to be doing fairly well in regard to his lower extremity ulcer. He has been tolerating the compression wrap without complication. With that being said it does appear at this point that there still a small opening at the proximal portion where the entire wound was although there's good epithelialization noted here as well to some Slough at this point. Nonetheless I did perform sharp debridement to remove some of the slough today. 06/06/18 on evaluation today patient appears to actually be doing very well in regard to his lower extremity ulcer. He has been tolerating the dressing changes without complication. Fortunately there does not appear to be any evidence of infection at this time which is good news. Overall I'm very pleased with the progress that he seems to be making. 06/13/18 on evaluation today patient's wound appears to be almost completely healed. He is new skin growing over  the surface of the wound which is great news. Fortunately he's been tolerating the dressing changes without complication. Overall been very pleased with how things have progressed in fact today this appears to be doing excellent. Overall the patient is also pleased no need for sharp debridement today which is actually the best news he's heard in quite some time. 06/20/18 on evaluation today patient appears to be doing very well and is very close to complete resolution. With that being said he still has some discomfort at the site and there is still a small opening and a wound underlying this once debridement was performed. With that being said he does seem to be making good progress it's just taking longer here at the end than what we would've liked obviously. 06/27/18 on evaluation today patient actually appears to be doing excellent in regard to his right medial  ankle ulcer. He's been tolerating the compression wrap without complication and fortunately appears to be healed today. He's not have any pain which is also good news. Overall I'm very pleased in this regard. With that being said the area does have brand-new skin overlying the region where it's healed I think it would be good to wrap the leg one more week for protection sake. Patient History Information obtained from Patient. Family History Heart Disease - Mother,Father, Hypertension - Mother,Father, Stroke - Father, No family history of Cancer, Diabetes, Hereditary Spherocytosis, Thyroid Problems, Tuberculosis. Social History Current every day smoker, Marital Status - Single, Alcohol Use - Never, Drug Use - No History, Caffeine Use - Daily. Medical History Eyes Denies history of Cataracts, Glaucoma, Optic Neuritis Ear/Nose/Mouth/Throat [name], [name] ([id number]) Denies history of Chronic sinus problems/congestion, Middle ear problems Hematologic/Lymphatic Denies history of Anemia, Hemophilia, Human Immunodeficiency Virus,  Lymphedema, Sickle Cell Disease Respiratory Patient has history of Chronic Obstructive Pulmonary Disease (COPD) Denies history of Aspiration, Asthma, Pneumothorax, Sleep Apnea, Tuberculosis Cardiovascular Patient has history of Arrhythmia - a fib, Deep Vein Thrombosis, Hypertension Denies history of Angina, Congestive Heart Failure, Coronary Artery Disease, Hypotension, Myocardial Infarction, Peripheral Arterial Disease, Peripheral Venous Disease, Phlebitis, Vasculitis Gastrointestinal Denies history of Cirrhosis , Colitis, Crohn s, Hepatitis A, Hepatitis B, Hepatitis C Endocrine Patient has history of Type II Diabetes Denies history of Type I Diabetes Genitourinary Denies history of End Stage Renal Disease Immunological Denies history of Lupus Erythematosus, Raynaud s, Scleroderma Integumentary (Skin) Denies history of History of Burn, History of pressure wounds Musculoskeletal Patient has history of Gout Denies history of Rheumatoid Arthritis, Osteoarthritis, Osteomyelitis Neurologic Patient has history of Neuropathy Denies history of Dementia, Quadriplegia, Paraplegia, Seizure Disorder Oncologic Denies history of Received Chemotherapy, Received Radiation Psychiatric Denies history of Anorexia/bulimia, Confinement Anxiety Hospitalization/Surgery History - 05/17/2012, [facility], CVA. - 03/17/2017, [facility], Pneumonia. Medical And Surgical History Notes Ear/Nose/Mouth/Throat difficulty speaking Respiratory Hospitalized for Pneumonia 02/2017 Neurologic CVA in 2014 Review of Systems (ROS) Constitutional Symptoms (General Health) Denies complaints or symptoms of Fever, Chills. Respiratory The patient has no complaints or symptoms. Cardiovascular The patient has no complaints or symptoms. Psychiatric The patient has no complaints or symptoms. [name], [name] ([id number]) Objective Constitutional Well-nourished and well-hydrated in no acute distress. Vitals Time Taken: 8:17 AM,  Height: 74 in, Weight: 220 lbs, BMI: 28.2, Temperature: 98.0 F, Pulse: 63 bpm, Respiratory Rate: 18 breaths/min, Blood Pressure: 112/72 mmHg. Respiratory normal breathing without difficulty. clear to auscultation bilaterally. Cardiovascular regular rate and rhythm with normal S1, S2. trace pitting edema of the bilateral lower extremities. Psychiatric this patient is able to make decisions and demonstrates good insight into disease process. Alert and Oriented x 3. pleasant and cooperative. General Notes: Patient's wound bed currently shows evidence of good epithelialization at this time. Fortunately there is no sign of opening or ulceration and although the new epithelial tissue does appear to be new and fragile it is doing excellent. He really has no significant lower extremity edema which is also good news. Integumentary (Hair, Skin) Wound #8 status is Healed - Epithelialized. Original cause of wound was Gradually Appeared. The wound is located on the Right,Proximal,Medial Lower Leg. The wound measures 0cm length x 0cm width x 0cm depth; 0cm^2 area and 0cm^3 volume. There is Fat Layer (Subcutaneous Tissue) Exposed exposed. There is no tunneling or undermining noted. There is a small amount of serous drainage noted. The wound margin is flat and intact. There is large (67-100%) red, pink granulation within the wound bed.  There is a small (1-33%) amount of necrotic tissue within the wound bed. The periwound skin appearance exhibited: Scarring, Hemosiderin Staining. The periwound skin appearance did not exhibit: Callus, Crepitus, Excoriation, Induration, Rash, Dry/Scaly, Maceration, Atrophie Blanche, Cyanosis, Ecchymosis, Mottled, Pallor, Rubor, Erythema. Periwound temperature was noted as No Abnormality. The periwound has tenderness on palpation. 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 Post procedure Diagnosis Wound #: Same as Pre-Procedure [name], [name] ([id number]) Plan Primary Wound Dressing: Non-adherent pad Dressing Change Frequency: Change dressing every week Follow-up Appointments: Return Appointment in 1 week. Edema Control: 3 Layer Compression System - Right Lower Extremity My suggestion at this point is gonna be that we wrap them for one more week just to protect the area and keep them from having to pull the compression stocking up and over his leg since again this would be a potential for harming the newly healed region. He's in agreement this plan. Following next week I fully expect him to be discharged from the clinic as having completely healed. If anything changes or worsens in the meantime he will contact the office and let me know otherwise what is put in a nonadherent pad over the healed region and then wrapping him with a three layer compression wrap. We will see where it stands in one weeks time. Please see above for specific wound care orders. We will see patient for re-evaluation in 1 week(s) here in the clinic. If anything worsens or changes patient will contact our office for additional recommendations. Electronic Signature(s) Signed: 07/02/2018 7:15:48 AM By: Worthy Keeler PA-C Entered By: Worthy Keeler on 06/27/2018 09:09:31 Loeza, [name] ([id number]) -------------------------------------------------------------------------------- ROS/PFSH Details Patient Name: [name] Date of Service: 06/27/2018 8:15 AM Medical Record Number: [id number] Patient Account Number: [id number] Date of Birth/Sex: December 15, 1950 (67 y.o. M) Treating RN: Montey Hora Primary Care Provider: Royetta Crochet Other  Clinician: Referring Provider: Royetta Crochet Treating Provider/Extender: Melburn Hake, HOYT Weeks in Treatment: 35 Information Obtained From Patient Wound History Do you currently have one or more open woundso Yes How many open wounds do you currently haveo 1 Approximately how long have you had your woundso 1 week How have you been treating your wound(s) until nowo lotion Has your wound(s) ever healed and then re-openedo Yes Have you had any lab work done in the past montho No Have you tested positive for an antibiotic resistant organism (MRSA, VRE)o No Have you tested positive for osteomyelitis (bone infection)o No Have you had any tests for circulation on your legso Yes Who ordered the testo [facility] Where was the test doneo [facility] Constitutional Symptoms (General Health) Complaints and Symptoms: Negative for: Fever; Chills Cardiovascular Complaints and Symptoms: No Complaints or Symptoms Complaints and Symptoms: Negative for: LE edema Medical History: Positive for: Arrhythmia - a fib; Deep Vein Thrombosis; Hypertension Negative for: Angina; Congestive Heart Failure; Coronary Artery Disease; Hypotension; Myocardial Infarction; Peripheral Arterial Disease; Peripheral Venous Disease; Phlebitis; Vasculitis Eyes Medical History: Negative for: Cataracts; Glaucoma; Optic Neuritis Ear/Nose/Mouth/Throat Medical History: Negative for: Chronic sinus problems/congestion; Middle ear problems Past Medical History Notes: difficulty speaking Hematologic/Lymphatic EDUAR, KUMPF ([id number]) Medical History: Negative for: Anemia; Hemophilia; Human Immunodeficiency Virus; Lymphedema; Sickle Cell Disease Respiratory Complaints and Symptoms: No Complaints or Symptoms Medical  History: Positive for: Chronic Obstructive Pulmonary Disease (COPD) Negative for: Aspiration; Asthma; Pneumothorax; Sleep Apnea; Tuberculosis Past Medical History Notes: Hospitalized for Pneumonia  02/2017 Gastrointestinal Medical History: Negative for: Cirrhosis ; Colitis; Crohnos; Hepatitis A; Hepatitis B; Hepatitis C Endocrine Medical History: Positive for: Type II Diabetes Negative for: Type I Diabetes Time with diabetes: since 2017 Treated with: Diet Blood sugar tested every day: No Genitourinary Medical History: Negative for: End Stage Renal Disease Immunological Medical History: Negative for: Lupus Erythematosus; Raynaudos; Scleroderma Integumentary (Skin) Medical History: Negative for: History of Burn; History of pressure wounds Musculoskeletal Medical History: Positive for: Gout Negative for: Rheumatoid Arthritis; Osteoarthritis; Osteomyelitis Neurologic Medical History: Positive for: Neuropathy Negative for: Dementia; Quadriplegia; Paraplegia; Seizure Disorder Past Medical History Notes: CVA in 2014 Oncologic Helmes, OLANREWAJU ([id number]) Medical History: Negative for: Received Chemotherapy; Received Radiation Psychiatric Complaints and Symptoms: No Complaints or Symptoms Medical History: Negative for: Anorexia/bulimia; Confinement Anxiety Immunizations Pneumococcal Vaccine: Received Pneumococcal Vaccination: Yes Immunization Notes: up to date Implantable Devices Hospitalization / Surgery History Name of Hospital Purpose of Hospitalization/[facility] Date [facility] 05/17/2012 [facility] Pneumonia 03/17/2017 Family and Social History Cancer: No; Diabetes: No; Heart Disease: Yes - Mother,Father; Hereditary Spherocytosis: No; Hypertension: Yes - Mother,Father; Stroke: Yes - Father; Thyroid Problems: No; Tuberculosis: No; Current every day smoker; Marital Status - Single; Alcohol Use: Never; Drug Use: No History; Caffeine Use: Daily; Financial Concerns: No; Food, Clothing or Shelter Needs: No; Support System Lacking: No; Transportation Concerns: No; Advanced Directives: No; Patient does not want information on Advanced Directives Electronic Signature(s) Signed:  06/27/2018 4:52:44 PM By: Montey Hora Signed: 07/02/2018 7:15:48 AM By: Worthy Keeler PA-C Entered By: Worthy Keeler on 06/27/2018 09:08:42 Dalton, [name] ([id number]) -------------------------------------------------------------------------------- SuperBill Details Patient Name: [name] Date of Service: 06/27/2018 Medical Record Number: [id number] Patient Account Number: [id number] Date of Birth/Sex: Nov 05, 1950 (67 y.o. M) Treating RN: Cornell Barman Primary Care Provider: Royetta Crochet Other Clinician: Referring Provider: Royetta Crochet Treating Provider/Extender: Melburn Hake, HOYT Weeks in Treatment: 35 Diagnosis Coding ICD-10 Codes Code Description I87.2 Venous insufficiency (chronic) (peripheral) I89.0 Lymphedema, not elsewhere classified E11.622 Type 2 diabetes mellitus with other skin ulcer L97.812 Non-pressure chronic ulcer of other part of right lower leg with fat layer exposed Facility Procedures CPT4 Code Description: 80034917 (Facility Use Only) [phone] - [facility] LWR RT LEG Modifier: Quantity: 1 Physician Procedures CPT4 Code Description: 7948016 [id number] - WC PHYS LEVEL 4 - EST PT ICD-10 Diagnosis Description I87.2 Venous insufficiency (chronic) (peripheral) I89.0 Lymphedema, not elsewhere classified E11.622 Type 2 diabetes mellitus with other skin ulcer L97.812  Non-pressure chronic ulcer of other part of right lower leg wi Modifier: th fat layer expos Quantity: 1 ed Electronic Signature(s) Signed: 07/02/2018 7:15:48 AM By: Worthy Keeler PA-C Entered By: Worthy Keeler on 06/27/2018 09:10:04

## 2018-07-02 NOTE — Progress Notes (Signed)
ISON, WICHMANN ([id number]) Visit Report for 06/27/2018 Arrival Information Details Patient Name: [name], [name] Date of Service: 06/27/2018 8:15 AM Medical Record Number: [id number] Patient Account Number: [id number] Date of Birth/Sex: 01-31-1951 (67 y.o. M) Treating RN: Montey Hora Primary Care Giavonni Cizek: Royetta Crochet Other Clinician: Referring Thalya Fouche: Royetta Crochet Treating Natisha Trzcinski/Extender: Melburn Hake, HOYT Weeks in Treatment: 47 Visit Information History Since Last Visit Added or deleted any medications: No Patient Arrived: Ambulatory Any new allergies or adverse reactions: No Arrival Time: 08:16 Had a fall or experienced change in No Accompanied By: self activities of daily living that may affect Transfer Assistance: None risk of falls: Patient Identification Verified: Yes Signs or symptoms of abuse/neglect since last visito No Secondary Verification Process Yes Hospitalized since last visit: Yes Completed: Has Dressing in Place as Prescribed: Yes Patient Requires Transmission-Based No Pain Present Now: Yes Precautions: Patient Has Alerts: Yes Patient Alerts: Patient on Blood Thinner DMII warfarin Electronic Signature(s) Signed: 06/27/2018 3:52:51 PM By: Lorine Bears RCP, RRT, CHT Entered By: Lorine Bears on 06/27/2018 08:17:34 [name], [name] ([id number]) -------------------------------------------------------------------------------- Compression Therapy Details Patient Name: [name] Date of Service: 06/27/2018 8:15 AM Medical Record Number: [id number] Patient Account Number: [id number] Date of Birth/Sex: 14-Mar-1951 (67 y.o. M) Treating RN: Montey Hora Primary Care Annah Jasko: Royetta Crochet Other Clinician: Referring Edwing Figley: Royetta Crochet Treating Roda Lauture/Extender: STONE III, HOYT Weeks in Treatment: 35 Compression Therapy Performed for Wound Assessment: NonWound Condition Lymphedema - Right  Leg Performed By: Clinician Montey Hora, RN Compression Type: Three Layer Pre Treatment ABI: 1.2 Post Procedure Diagnosis Same as Pre-procedure Electronic Signature(s) Signed: 06/27/2018 4:52:44 PM By: Montey Hora Entered By: Montey Hora on 06/27/2018 08:54:21 [name], [name] ([id number]) -------------------------------------------------------------------------------- Encounter Discharge Information Details Patient Name: [name] Date of Service: 06/27/2018 8:15 AM Medical Record Number: [id number] Patient Account Number: [id number] Date of Birth/Sex: Feb 14, 1951 (67 y.o. M) Treating RN: Montey Hora Primary Care Joory Gough: Royetta Crochet Other Clinician: Referring Amoree Newlon: Royetta Crochet Treating Vertis Bauder/Extender: Melburn Hake, HOYT Weeks in Treatment: 15 Encounter Discharge Information Items Discharge Condition: Stable Ambulatory Status: Ambulatory Discharge Destination: Home Transportation: Private Auto Accompanied By: self Schedule Follow-up Appointment: Yes Clinical Summary of Care: Electronic Signature(s) Signed: 06/27/2018 4:52:44 PM By: Montey Hora Entered By: Montey Hora on 06/27/2018 09:00:22 [name], [name] ([id number]) -------------------------------------------------------------------------------- Lower Extremity Assessment Details Patient Name: [name] Date of Service: 06/27/2018 8:15 AM Medical Record Number: [id number] Patient Account Number: [id number] Date of Birth/Sex: 1951-01-17 (67 y.o. M) Treating RN: Army Melia Primary Care Mikylah Ackroyd: Royetta Crochet Other Clinician: Referring Moris Ratchford: Royetta Crochet Treating Artesia Berkey/Extender: Melburn Hake, HOYT Weeks in Treatment: 35 Edema Assessment Assessed: [Left: No] [Right: No] E[Left: dema] [Right: :] Calf Left: Right: Point of Measurement: 34 cm From Medial Instep cm 38 cm Ankle Left: Right: Point of Measurement: 12 cm From Medial Instep cm 27 cm Vascular  Assessment Pulses: Dorsalis Pedis Palpable: [Right:Yes] Posterior Tibial Extremity colors, hair growth, and conditions: Extremity Color: [Right:Hyperpigmented] Hair Growth on Extremity: [Right:No] Temperature of Extremity: [Right:Warm] Capillary Refill: [Right:< 3 seconds] Toe Nail Assessment Left: Right: Thick: Yes Discolored: Yes Deformed: No Improper Length and Hygiene: No Electronic Signature(s) Signed: 06/27/2018 4:35:39 PM By: Army Melia Entered By: Army Melia on 06/27/2018 08:34:57 [name], [name] ([id number]) -------------------------------------------------------------------------------- Multi Wound Chart Details Patient Name: [name] Date of Service: 06/27/2018 8:15 AM Medical Record Number: [id number] Patient Account Number: [id number] Date of Birth/Sex: 1950/08/19 (67 y.o. 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] [N/A:N/A] Secondary Etiology: [8:Lymphedema] [N/A:N/A] Comorbid History: [8:Chronic Obstructive Pulmonary Disease (COPD), Arrhythmia, Deep Vein Thrombosis, Hypertension, Type II Diabetes, Gout, Neuropathy] [N/A:N/A] Date Acquired: [8:10/17/2017] [N/A:N/A] Weeks of Treatment: [8:35] [N/A:N/A] Wound Status: [8:Healed - Epithelialized] [N/A:N/A] Measurements L x W x D [8:0x0x0] [N/A:N/A] (cm) Area (cm) : [8:0] [N/A:N/A] Volume (cm) : [8:0] [N/A:N/A] % Reduction in Area: [8:100.00%] [N/A:N/A] % Reduction in  Volume: [8:100.00%] [N/A:N/A] Classification: [8:Grade 2] [N/A:N/A] Exudate Amount: [8:Small] [N/A:N/A] Exudate Type: [8:Serous] [N/A:N/A] Exudate Color: [8:amber] [N/A:N/A] Wound Margin: [8:Flat and Intact] [N/A:N/A] Granulation Amount: [8:Large (67-100%)] [N/A:N/A] Granulation Quality: [8:Red, Pink] [N/A:N/A] Necrotic Amount: [8:Small (1-33%)] [N/A:N/A] Exposed Structures: [8:Fat Layer (Subcutaneous Tissue) Exposed: Yes Fascia: No Tendon: No Muscle: No Joint: No Bone: No] [N/A:N/A] Epithelialization: Medium (34-66%) N/A N/A Periwound Skin Texture: Scarring: Yes N/A N/A Excoriation: No Induration: No Callus: No Crepitus: No Rash: No Periwound Skin Moisture: Maceration: No N/A N/A Dry/Scaly: No Periwound Skin Color: Hemosiderin Staining: Yes N/A N/A Atrophie Blanche: No Cyanosis: No Ecchymosis: No Erythema: No Mottled: No Pallor: No Rubor: No Temperature: No Abnormality N/A N/A Tenderness on Palpation: Yes N/A N/A Wound Preparation: Ulcer Cleansing: N/A N/A Rinsed/Irrigated with Saline, Other: soap and water Topical Anesthetic Applied: Other: lidocaine 4% Treatment Notes Electronic Signature(s) Signed: 06/27/2018 4:52:44 PM By: Montey Hora Entered By: Montey Hora on 06/27/2018 08:53:31 [name], [name] ([id number]) -------------------------------------------------------------------------------- Multi-Disciplinary Care Plan Details Patient Name: [name] Date of Service: 06/27/2018 8:15 AM Medical Record Number: [id number] Patient Account Number: [id number] Date of Birth/Sex: August 04, 1950 (67 y.o. M) Treating RN: Montey Hora Primary Care Aymee Fomby: Royetta Crochet Other Clinician: Referring Jazmin Ley: Royetta Crochet Treating Kage [name]/Extender: Melburn Hake, HOYT Weeks in Treatment: 41 Active Inactive Abuse / Safety / Falls / Self Care Management Nursing Diagnoses: Potential for falls Goals: Patient will not experience any injury related to  falls Date Initiated: 10/25/2017 Target Resolution Date: 02/18/2018 Goal Status: Active Interventions: Assess Activities of Daily Living upon admission and as needed Assess fall risk on admission and as needed Assess: immobility, friction, shearing, incontinence upon admission and as needed Assess impairment of mobility on admission and as needed per policy Assess personal safety and home safety (as indicated) on admission and as needed Assess self care needs on admission and as needed Notes: Nutrition Nursing Diagnoses: Imbalanced nutrition Impaired glucose control: actual or potential Potential for alteratiion in Nutrition/Potential for imbalanced nutrition Goals: Patient/caregiver agrees to and verbalizes understanding of need to use nutritional supplements and/or vitamins as prescribed Date Initiated: 10/25/2017 Target Resolution Date: 02/18/2018 Goal Status: Active Patient/caregiver will maintain therapeutic glucose control Date Initiated: 10/25/2017 Target Resolution Date: 02/18/2018 Goal Status: Active Interventions: Assess patient nutrition upon admission and as needed per policy Provide education on elevated blood sugars and impact on wound healing Provide education on nutrition ABDINASIR, SPADAFORE ([id number]) Notes: Orientation to the Wound Care Program Nursing Diagnoses: Knowledge deficit related to the wound healing center program Goals: Patient/caregiver will verbalize understanding of the [facility] Program Date Initiated: 10/25/2017 Target Resolution Date: 11/19/2017 Goal Status: Active Interventions: Provide education on orientation to the wound center Notes: Electronic Signature(s) Signed: 06/27/2018 4:52:44 PM By: Montey Hora Entered By: Montey Hora on 06/27/2018 08:53:24 [name], [name] ([id number]) -------------------------------------------------------------------------------- Pain Assessment Details Patient Name: Gouge,  Ankit Date of Service: 06/27/2018 8:15 AM Medical Record Number: [id number] Patient Account Number: [id number] Date of Birth/Sex: [birth year] (68 y.o. M) Treating RN: Montey Hora Primary Care Abiha Lukehart: Royetta Crochet Other Clinician: Referring Kitrina Maurin: Royetta Crochet Treating Taylin Leder/Extender: STONE III, HOYT Weeks in Treatment: 35 Active Problems Location of Pain Severity and Description of Pain Patient Has Paino Yes Site Locations Rate the pain. Current Pain Level: 5 Pain Management and Medication Current Pain Management: Electronic Signature(s) Signed: 06/27/2018 3:52:51 PM By: Lorine Bears RCP, RRT, CHT Signed: 06/27/2018 4:52:44 PM By: Montey Hora Entered By: Lorine Bears on 06/27/2018 08:17:44 [name], [name] ([id number]) -------------------------------------------------------------------------------- Patient/Caregiver Education Details Patient Name: [name] Date of Service: 06/27/2018 8:15 AM Medical Record Number: [id number] Patient Account Number: [id number] Date of Birth/Gender: 04/08/1951 (67 y.o. M) Treating RN: Montey Hora Primary Care Physician: Royetta Crochet Other Clinician: Referring Physician: Royetta Crochet Treating Physician/Extender: Sharalyn Ink in Treatment: 47 Education Assessment Education Provided To: Patient Education Topics Provided Venous: Handouts: Other: bring compression next visit Methods: Explain/Verbal Responses: State content correctly Electronic Signature(s) Signed: 06/27/2018 4:52:44 PM By: Montey Hora Entered By: Montey Hora on 06/27/2018 09:00:39 [name], [name] ([id number]) -------------------------------------------------------------------------------- Wound Assessment Details Patient Name: [name] Date of Service: 06/27/2018 8:15 AM Medical Record Number: [id number] Patient Account Number: [id number] Date of Birth/Sex: 25-Dec-1950 (67 y.o.  M) Treating RN: Montey Hora Primary Care Ariadna Setter: Royetta Crochet Other Clinician: Referring Palmer Fahrner: Royetta Crochet Treating Magdelena Kinsella/Extender: STONE III, HOYT Weeks in Treatment: 35 Wound Status Wound Number: 8 Primary Diabetic Wound/Ulcer of the Lower Extremity Etiology: Wound Location: Right, Proximal, Medial Lower Leg Secondary Lymphedema Wounding Event: Gradually Appeared Etiology: Date Acquired: 10/17/2017 Wound Healed - Epithelialized Weeks Of Treatment: 35 Status: Clustered Wound: No Comorbid Chronic Obstructive Pulmonary Disease History: (COPD), Arrhythmia, Deep Vein Thrombosis, Hypertension, Type II Diabetes, Gout, Neuropathy Photos Photo Uploaded By: Army Melia on 06/27/2018 10:20:05 Wound Measurements Length: (cm) Width: (cm) Depth: (cm) Area: (cm) Volume: (cm) 0 % Reduction in Area: 100% 0 % Reduction in Volume: 100% 0 Epithelialization: Medium (34-66%) 0 Tunneling: No 0 Undermining: No Wound Description Classification: Grade 2 Wound Margin: Flat and Intact Exudate Amount: Small Exudate Type: Serous Exudate Color: amber Foul Odor After Cleansing: No Slough/Fibrino No Wound Bed Granulation Amount: Large (67-100%) Exposed Structure Granulation Quality: Red, Pink Fascia Exposed: No Necrotic Amount: Small (1-33%) Fat Layer (Subcutaneous Tissue) Exposed: Yes Tendon Exposed: No Muscle Exposed: No Joint Exposed: No [name], Dezmen ([id number]) Bone Exposed: No Periwound Skin Texture Texture Color No Abnormalities Noted: No No Abnormalities Noted: No Callus: No Atrophie Blanche: No Crepitus: No Cyanosis: No Excoriation: No Ecchymosis: No Induration: No Erythema: No Rash: No Hemosiderin Staining: Yes Scarring: Yes Mottled: No Pallor: No Moisture Rubor: No No Abnormalities Noted: No Dry / Scaly: No Temperature / Pain Maceration: No Temperature: No Abnormality Tenderness on Palpation: Yes Wound Preparation Ulcer  Cleansing: Rinsed/Irrigated with Saline, Other: soap and water, Topical Anesthetic Applied: Other: lidocaine 4%, Electronic Signature(s) Signed: 06/27/2018 4:52:44 PM By: Montey Hora Entered By: Montey Hora on 06/27/2018 08:52:08 [name], [name] ([id number]) -------------------------------------------------------------------------------- Vitals Details Patient Name: [name] Date of Service: 06/27/2018 8:15 AM Medical Record Number: [id number] Patient Account Number: [id number] Date of Birth/Sex: 1950/08/06 (67 y.o. M) Treating RN: Montey Hora Primary Care Kimerly Rowand: Royetta Crochet Other Clinician: Referring Coreon Simkins: Royetta Crochet Treating Athanasia Stanwood/Extender: STONE III, HOYT Weeks in Treatment: 35 Vital Signs Time Taken: 08:17 Temperature (F): 98.0 Height (in): 74 Pulse (bpm): 63 Weight (lbs): 220 Respiratory Rate (breaths/min): 18 Body Mass Index (  BMI): 28.2 Blood Pressure (mmHg): 112/72 Reference Range: 80 - 120 mg / dl Airway Electronic Signature(s) Signed: 06/27/2018 3:52:51 PM By: Lorine Bears RCP, RRT, CHT Entered By: Becky Sax, Amado Nash on 06/27/2018 08:23:15

## 2018-07-03 ENCOUNTER — Telehealth: Payer: Self-pay

## 2018-07-03 NOTE — Telephone Encounter (Signed)
EMMI Follow-up: Noted on the report that the patient wasn't aware of follow-up appointments.  I talked with [name] and he said he had already seen his PCP last week and was aware for future appointments for wound care on Feb. 18th and regular foot care in April. No other questions or concerns noted for today.

## 2018-07-04 ENCOUNTER — Encounter: Payer: Medicare Other | Admitting: Physician Assistant

## 2018-07-04 DIAGNOSIS — Z09 Encounter for follow-up examination after completed treatment for conditions other than malignant neoplasm: Secondary | ICD-10-CM | POA: Diagnosis not present

## 2018-07-05 NOTE — Progress Notes (Signed)
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 although the CULTURE turned out to be negative. He completed his antibiotics this morning. Wound looks somewhat better,  I put silver alginate on it last week again out of concern for infection. We do not have a TheraSkin this week not ordered last week 09/28/16; no major change from last week. We've looked over the volume of this wound and of not had major changes in spite of TheraSkin although the last TheraSkin was almost a month ago. We put silver alginate on last week out of fear of infection. I will switch to [facility] as looking over the records didn't really suggest that theraskin had helped 10/12/16; we'll use Hydrofera Blue starting last week. No major change in the wound dimensions. 10/19/16; continue Hydrofera Blue. 1.8 x 2.5 x 0.2. Wound base looks healthy 10/26/16; continue with Hydrofera Blue. 1.5 x 2.4 x 0.2 11/02/16 no change in dimensions. Change from [facility] to Iodoflex 11/09/16; patient complains of increasing [name]. Dimensions slightly larger. Last week I put Iodoflex on this wound to see if we can get a better surface I also increased him to 4 layer compression. He has not been systemically unwell. 11/16/16; patient states his leg feels better. He has completed his antibiotics. Dimensions are better. I change back to [facility] last week. We also change the dressing once on Friday which may have helped. Wound looks a lot better today than last week.. 2.2 x 2.4 x 0.3 11/23/16 on evaluation today patient's right lower extremity ulcer appears to be doing very well. He has been tolerating the [facility] Dressing and tells me that fortunately he is finally improvement. He is pleased with how this is progressing. [name], [name] ([id number]) 11/30/16; improved using Hydrofera Blue 12/07/16; continue dramatic progress. Wound is now small and healthy looking. Continue use of Hydrofera Blue 12/14/16; very small wound albeit with some depth. Continued use of Hydrofera blue. 12/21/16 on evaluation today patient's right lower extremity ulcer appears healed at this point. He is having no discomfort  and overall this is doing very well. And there's no sign of infection. 04/26/17; READMISSION Patient we know from previous stays in this clinic. The patient has known diabetic PAD and has a stent in the right leg. 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 When he  was last in the clinic in August we had healed him out for what was felt to be a mostly venous insufficiency wound on the medial right leg. He follows with vascular surgery Dr. dew is at [place] [facility]. He has previously undergone successful laser ablation. Reflux studies on 4/24 showed reflux in the common femoral, femoral and popliteal. He underwent successful ablation of the right greater saphenous vein from the distal thigh to the mid calf level. Successful ablation of the right small saphenous vein. He had unsuccessful ablation of the right greater saphenous vein from the saphenofemoral junction to the distal 5 level. The patient wears to let her compression stockings and he has been compliant with this With regards to his diabetes he is not currently on any treatment. He does describe [name] in his leg which forces him to stop although I couldn't really quantify this. His last arterial studies were on 07/05/16 which showed triphasic waveforms on the right to the level of the popliteal artery. The right posterior tibial and anterior tibial artery were not visualized on this study. I don't actually see ABIs or TBIs. 05/04/17; the patient has small wounds on the right medial heel and the right medial leg. The area on the foot is just about closed. The area on the right medial leg is improved. He is tolerating the 3 layer compression without any complaints. The patient has both known PAD and chronic venous reflux [see discussion above]. 05/11/17; the areas on the patient's medial right heel with healed he still has an open area on the right medial leg. This does not require debridement today I  think I did read it last week. We are using silver collagen under 3 layer compression. 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 He has been tolerating the dressing changes without complication. With that being said he does have some epithelialization growing into the wound bed and it appears this wound may be trying to healed in a depressed fashion. To be honest that is not the worst case scenario and I think this may definitely do fine even if it heals in that way. There was no requirement for debridement today as patient appears to be doing very well in regard to his ulcer. His swelling was a little bit more severe than last week but nothing too significant. 07/05/17 on evaluation today patient appears to be doing rather well in regard to his right medial lower extremity ulcer. The wound does appear to be healing although it is healing and somewhat of a cratered fashion the good news is he has excellent epithelium noted. Fortunately there does not appear to be any evidence of infection at this point he is tolerating the dressings as well is the wrap well. 07/12/17 on evaluation today patient appears to be doing very well in regard to his left medial lower extremity ulcer. He has been tolerating the dressing changes without complication. In fact compared to last week this wound which was somewhat concave has fielding dramatically and looks to be doing excellent. I'm extremely happy with the progress. He still has [name] rated to be a 4-5/10 but the wound looks great. 07/20/17; the patient's right medial lower extremity ulcer is closed. He has good edema control. Unfortunately he does not really have adequate compression stockings. Certainly what he brought in the blood on his right  leg might of been support hose however there is whole  in the stocking already Readmission: 10/25/17 patient seen today for reevaluation due to a recurrence of the right medial ankle venous leg ulcer. 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 He has been treated with compression stockings for some time now in fact we have notes having treated him even as far back as 2016 here in our clinic. Fortunately he has excellent blood flow and tends to heal well. Unfortunately despite wearing his compression stockings, keeping his legs elevated, being on fluid pills (Lasix) and doing everything he can to try to help with fluid he still continues to have issues with this reopening. I think that he may be a good candidate for compression/lymphedema pumps. We have never attempted to get these for him previously. No fevers, chills, nausea, or vomiting noted at this time. Currently patient states his ulcer does hurt although not as bad as some in the past it's also not as large as it has been in the past when it reopened he talked is somewhat early. Obviously this is good news. [name], [name] ([id number]) 12/13/17 on evaluation today patient appears to actually be doing very well in regard to his right medial ankle ulcer compared to last week's evaluation. I think having him on the correct antibiotics has made all the difference in the world. With that being said he's not having discomfort like he was previous and in general I feel like he has made excellent strides towards healing in the past week. No fevers, chills, nausea, or vomiting noted at this time. 01/10/18 on evaluation today patient appears to be doing a little worse compared to last week's evaluation. I think that though the Iodoflex has help with cleaning up the surface of the wound subsequently he has having a lot of drainage which I think is causing some issues in that regard. At this point I think we may want to switch the  dressings. 01/17/18 on evaluation today patient appears to be doing rather well in regard to his right medial malleolus ulcer. The good news is I do feel like he showing some signs of improvement which is excellent. I feel like that the [facility] Dressing been better for him than the Iodoflex that we will previously utilizing. He is having much less in the way of discomfort which is good news. Overall very pleased in that regard. Nonetheless the appearance of the wound is improved today as well and that is excellent news. He still has [name] but not seemingly as much as previous and I don't feel like there's much maceration either. 01/23/18 on evaluation today patient appears in our clinic a day early for his appointment due to the fact that he was having discomfort and issues with his wrap. Upon inspection of the wound actually appears to be doing fairly well which is good news. With that being said he is having a lot of discomfort at this point unfortunately. I think that this is mainly due to the fact that he is having such drainage as the wound bed itself seems to be doing well. I do believe the [facility] Dressing well is helpful for him which is good news. Nonetheless again I think he may need more frequent dressing changes. 01/31/18 on evaluation today patient actually appears to be doing very well in regard to his right medial ankle ulcer. This is significantly better than what was even  noted last week I do think that the extra dressing change with the nurse visit toward the end of the week made a significant improvement for him. Overall I'm very pleased with how things appear today. 02/07/18 on evaluation today patient actually appears to be doing a little better in regard to his wound. The measurements are smaller today compared to previous. Fortunately there's no evidence of any infection and I do believe the moisture is much better control with more frequent wrap changes. Overall the  patient seems to be making good progress. 02/14/18 on evaluation today patient actually appears to be doing very well in regard to his medial ankle ulcer of the right lower extremity. With that being said he saw slough buildup and this is still been somewhat slow to heal. For that reason my suggestion currently is gonna be that we may need to see about checking on a skin substitute possibly Apligraf to see if this will be of benefit for him. In the past he has benefited from Apligraf. Overall there does not appear to be any evidence of infection at this time which is definitely good news. 02/28/18 on evaluation today patient's medial ankle ulcer on the right actually appears to be doing significantly better compared to previous. He has been tolerating the dressing changes without complication. Fortunately there appears to be no evidence of infection which is great news. Overall I do feel like that he has made improvement although there has been some delay in general and the wound is taken a little longer than I would've liked up to this point. Nonetheless there's no sign of infection and therefore I think that we can proceed with the Apligraf applications we actually got this approved for him. That something that I'm gonna see about ordering for next week. 03/07/18 on evaluation today patient unfortunately appears to be doing significantly worse than normal as far as his overall and general appearance is concerned. He has been tolerating the dressing changes without complication. With that being said nothing appears to be problematic in regard to his wound. The issue that I see right now involves more his general malaise and the fact that he does appear to have atrial fibrillation which is a known issue but unfortunately he is dropping low to the point of having 3-5 seconds of sustained pulses that are around 30 bpm. This was confirmed actually during evaluation today by myself. He's been having left  lower quadrant [name] and was actually in the emergency department on 17 October most recently. According to the notes reviewed it did not appear that anything was identified as far as a cause of the reason for this. It does not appear to the wound related which is good news. Nonetheless still I'm unsure of exactly what is going on but I am worried on top of everything else and the patient feeling lethargic he showed up late for his appointment today which is very uncharacteristic for this gentleman. 03/15/18; the patient was sent to the ER last week during his evaluation but he was not admitted. I have not reviewed the emergency room record. He states he feels better. He is here for application of Apligraf #1. The Apligraf we had would've expired tomorrow so he was brought in early 03/28/18 on evaluation today patient actually appears to be doing well in my pinion in regard to the overall appearance of his right lower Trinity ulcer. He has been tolerating the dressing changes without complication. Fortunately there does not appear to  be any evidence of infection at this time which is also good news. He does seem to have tolerated the first Apligraf Sloop, Herminio ([id number]) application. He is here for number two today. 04/11/18 upon evaluation today patient actually appears to be doing very well in regard to his right medial malleolus ulcer. He has been tolerating the dressing changes specifically the compression wrap without complication. The Apligraf does seem to be beneficial for him he showing signs of good improvement which is excellent news. Overall I'm very pleased even though the measurements have not dramatically improve the appearance of the wound has. 04/25/18 on evaluation today patient actually appears to be doing much better in regard to his right medial ankle ulcer. He has been tolerating the dressing changes without complication specifically the Apligraf and the compression  wrap. At this point I think he has made great progress even since last week. Fortunately there does not appear to be any evidence of infection at this time. 05/09/18 on evaluation today patient appears to be doing very well in regard to his lower Trinity ulcer. In fact this has made great progress since last time I seen him there's just one small area remaining this is at the top where it was worse. Fortunately there is no sign of infection and I do believe he's making great progress. This is his last application of the Apligraf today. 05/23/18 on evaluation today patient's wound actually appears to be showing signs of good improvement. He has completed his series of Apligraf at this point. All in all I do believe this was of great benefit for him and were very close to seeing this completely resolved. 05/30/18 upon evaluation today patient actually appears to be doing fairly well in regard to his lower extremity ulcer. He has been tolerating the compression wrap without complication. With that being said it does appear at this point that there still a small opening at the proximal portion where the entire wound was although there's good epithelialization noted here as well to some Slough at this point. Nonetheless I did perform sharp debridement to remove some of the slough today. 06/06/18 on evaluation today patient appears to actually be doing very well in regard to his lower extremity ulcer. He has been tolerating the dressing changes without complication. Fortunately there does not appear to be any evidence of infection at this time which is good news. Overall I'm very pleased with the progress that he seems to be making. 06/13/18 on evaluation today patient's wound appears to be almost completely healed. He is new skin growing over the surface of the wound which is great news. Fortunately he's been tolerating the dressing changes without complication. Overall been very pleased with how things  have progressed in fact today this appears to be doing excellent. Overall the patient is also pleased no need for sharp debridement today which is actually the best news he's heard in quite some time. 06/20/18 on evaluation today patient appears to be doing very well and is very close to complete resolution. With that being said he still has some discomfort at the site and there is still a small opening and a wound underlying this once debridement was performed. With that being said he does seem to be making good progress it's just taking longer here at the end than what we would've liked obviously. 06/27/18 on evaluation today patient actually appears to be doing excellent in regard to his right medial ankle ulcer. He's been tolerating the compression wrap  without complication and fortunately appears to be healed today. He's not have any [name] which is also good news. Overall I'm very pleased in this regard. With that being said the area does have brand-new skin overlying the region where it's healed I think it would be good to wrap the leg one more week for protection sake. 07/04/18 on evaluation today patient's wound appears to be completely healed and he has done extremely well in this regard. Fortunately there's no signs of infection at this time. No fevers, chills, nausea, or vomiting noted at this time. Patient History Information obtained from Patient. Family History Heart Disease - Mother,Father, Hypertension - Mother,Father, Stroke - Father, No family history of Cancer, Diabetes, Hereditary Spherocytosis, Thyroid Problems, Tuberculosis. Social History Current every day smoker, Marital Status - Single, Alcohol Use - Never, Drug Use - No History, Caffeine Use - Daily. Medical History CASSANDRA, MCMANAMAN ([id number]) Eyes Denies history of Cataracts, Glaucoma, Optic Neuritis Ear/Nose/Mouth/Throat Denies history of Chronic sinus problems/congestion, Middle ear  problems Hematologic/Lymphatic Denies history of Anemia, Hemophilia, Human Immunodeficiency Virus, Lymphedema, Sickle Cell Disease Respiratory Patient has history of Chronic Obstructive Pulmonary Disease (COPD) Denies history of Aspiration, Asthma, Pneumothorax, Sleep Apnea, Tuberculosis Cardiovascular Patient has history of Arrhythmia - a fib, Deep Vein Thrombosis, Hypertension Denies history of Angina, Congestive Heart Failure, Coronary Artery Disease, Hypotension, Myocardial Infarction, Peripheral Arterial Disease, Peripheral Venous Disease, Phlebitis, Vasculitis Gastrointestinal Denies history of Cirrhosis , Colitis, Crohn s, Hepatitis A, Hepatitis B, Hepatitis C Endocrine Patient has history of Type II Diabetes Denies history of Type I Diabetes Genitourinary Denies history of End Stage Renal Disease Immunological Denies history of Lupus Erythematosus, Raynaud s, Scleroderma Integumentary (Skin) Denies history of History of Burn, History of pressure wounds Musculoskeletal Patient has history of Gout Denies history of Rheumatoid Arthritis, Osteoarthritis, Osteomyelitis Neurologic Patient has history of Neuropathy Denies history of Dementia, Quadriplegia, Paraplegia, Seizure Disorder Oncologic Denies history of Received Chemotherapy, Received Radiation Psychiatric Denies history of Anorexia/bulimia, Confinement Anxiety Hospitalization/Surgery History - 05/17/2012, [facility], CVA. - 03/17/2017, [facility], Pneumonia. Medical And Surgical History Notes Ear/Nose/Mouth/Throat difficulty speaking Respiratory Hospitalized for Pneumonia 02/2017 Neurologic CVA in 2014 Review of Systems (ROS) Constitutional Symptoms (General Health) Denies complaints or symptoms of Fever, Chills. Respiratory The patient has no complaints or symptoms. Cardiovascular Complains or has symptoms of LE edema. Psychiatric The patient has no complaints or symptoms. Minkoff, [name]  ([id number]) Objective Constitutional Well-nourished and well-hydrated in no acute distress. Vitals Time Taken: 8:06 AM, Height: 74 in, Weight: 220 lbs, BMI: 28.2, Temperature: 97.8 F, Pulse: 49 bpm, Respiratory Rate: 16 breaths/min, Blood Pressure: 120/62 mmHg. Respiratory normal breathing without difficulty. Psychiatric this patient is able to make decisions and demonstrates good insight into disease process. Alert and Oriented x 3. pleasant and cooperative. General Notes: On evaluation today patient's wound bed actually showed signs of complete epithelialization in the epithelium is much more healthy appearing and stronger compared to last week's evaluation. Overall I think he has done very well. 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 I suggested that he wear his compression. I  suggested really 20-30 mmHg compression. With that being said he states that he really cannot get this on very well and therefore he's going to stick with this 15 to 20 mmHg compression that he has with him today. Although this is not ideal obviously if he's not wearing it that's also not ideal. We will therefore continue with this treatment to help prevent future recurrence of living is like ulcer. If anything changes worsens will contact the office and let me know. SHIELDS, PAUTZ ([id number]) Electronic Signature(s) Signed: 07/04/2018 5:20:18 PM By: Mark Keeler PA-C Entered By: [name] on 07/04/2018  08:40:57 [place], [name] ([id number]) -------------------------------------------------------------------------------- ROS/PFSH Details Patient Name: [name] Date of Service: 07/04/2018 8:00 AM Medical Record Number: [id number] Patient Account Number: [id number] Date of Birth/Sex: 1950-11-07 (67 y.o. M) Treating RN: [name] Primary Care Provider: Royetta [name] Other Clinician: Referring Provider: Royetta [name] Treating Provider/Extender: [name], [name] in Treatment: 36 Information Obtained From Patient Wound History Do you currently have one or more open woundso Yes How many open wounds do you currently haveo 1 Approximately how long have you had your woundso 1 week How have you been treating your wound(s) until nowo lotion Has your wound(s) ever healed and then re-openedo Yes Have you had any lab work done in the past montho No Have you tested positive for an antibiotic resistant organism (MRSA, VRE)o No Have you tested positive for osteomyelitis (bone infection)o No Have you had any tests for circulation on your legso Yes Who ordered the testo [facility] Where was the test doneo [facility] Constitutional Symptoms (General Health) Complaints and Symptoms: Negative for: Fever; Chills Cardiovascular Complaints and Symptoms: Positive for: LE edema Medical History: Positive for: Arrhythmia - a fib; Deep Vein Thrombosis; Hypertension Negative for: Angina; Congestive Heart Failure; Coronary Artery Disease; Hypotension; Myocardial Infarction; Peripheral Arterial Disease; Peripheral Venous Disease; Phlebitis; Vasculitis Eyes Medical History: Negative for: Cataracts; Glaucoma; Optic Neuritis Ear/Nose/Mouth/Throat Medical History: Negative for: Chronic sinus problems/congestion; Middle ear problems Past Medical History Notes: difficulty speaking Hematologic/Lymphatic Medical History: Negative for: Anemia; Hemophilia; Human Immunodeficiency Virus; Lymphedema;  Sickle Cell Disease Respiratory [name], Zackeriah ([id number]) Complaints and Symptoms: No Complaints or Symptoms Medical History: Positive for: Chronic Obstructive Pulmonary Disease (COPD) Negative for: Aspiration; Asthma; Pneumothorax; Sleep Apnea; Tuberculosis Past Medical History Notes: Hospitalized for Pneumonia 02/2017 Gastrointestinal Medical History: Negative for: Cirrhosis ; Colitis; Crohnos; Hepatitis A; Hepatitis B; Hepatitis C Endocrine Medical History: Positive for: Type II Diabetes Negative for: Type I Diabetes Time with diabetes: since 2017 Treated with: Diet Blood sugar tested every day: No Genitourinary Medical History: Negative for: End Stage Renal Disease Immunological Medical History: Negative for: Lupus Erythematosus; Raynaudos; Scleroderma Integumentary (Skin) Medical History: Negative for: History of Burn; History of pressure wounds Musculoskeletal Medical History: Positive for: Gout Negative for: Rheumatoid Arthritis; Osteoarthritis; Osteomyelitis Neurologic Medical History: Positive for: Neuropathy Negative for: Dementia; Quadriplegia; Paraplegia; Seizure Disorder Past Medical History Notes: CVA in 2014 Oncologic Medical History: Negative for: Received Chemotherapy; Received Radiation Psychiatric [name], [name] ([id number]) Complaints and Symptoms: No Complaints or Symptoms Medical History: Negative for: Anorexia/bulimia; Confinement Anxiety Immunizations Pneumococcal Vaccine: Received Pneumococcal Vaccination: Yes Immunization Notes: up to date Implantable Devices Hospitalization / Surgery History Name of Hospital Purpose of Hospitalization/[place] Date [place] CVA 05/17/2012 [facility] Pneumonia 03/17/2017 Family and Social History Cancer: No; Diabetes: No; Heart Disease: Yes - Mother,Father; Hereditary Spherocytosis: No; Hypertension: Yes - Mother,Father; Stroke: Yes - Father; Thyroid Problems: No; Tuberculosis: No; Current every day  smoker; Marital Status - Single;  Alcohol Use: Never; Drug Use: No History; Caffeine Use: Daily; Financial Concerns: No; Food, Clothing or Shelter Needs: No; Support System Lacking: No; Transportation Concerns: No; Advanced Directives: No; Patient does not want information on Advanced Directives Physician Affirmation I have reviewed and agree with the above information. Electronic Signature(s) Signed: 07/04/2018 4:18:49 PM By: [name] Signed: 07/04/2018 5:20:18 PM By: Mark Keeler PA-C Entered By: [name] on 07/04/2018 08:39:49 Schaad, [name] ([id number]) -------------------------------------------------------------------------------- SuperBill Details Patient Name: [name] Date of Service: 07/04/2018 Medical Record Number: [id number] Patient Account Number: [id number] Date of Birth/Sex: June 24, 1950 (67 y.o. 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 Facility Procedures CPT4 Code: 68088110 Description: 99213 - WOUND CARE VISIT-LEV 3 EST PT Modifier: Quantity: 1 Physician Procedures CPT4 Code Description: 3159458 [id number] - WC PHYS LEVEL 3 - EST PT ICD-10 Diagnosis Description I87.2 Venous insufficiency (chronic) (peripheral) I89.0 Lymphedema, not elsewhere classified E11.622 Type 2 diabetes mellitus with other skin ulcer L97.812  Non-pressure chronic ulcer of other part of right lower leg wi Modifier: th fat layer expo Quantity: 1 sed Electronic Signature(s) Signed: 07/04/2018 5:20:18 PM By: Mark Keeler PA-C Entered By: [name] on 07/04/2018 08:41:07

## 2018-07-08 NOTE — Progress Notes (Signed)
[name], [name] ([id number]) Visit Report for 07/04/2018 Arrival Information Details Patient Name: [name], [name] Date of Service: 07/04/2018 8:00 AM Medical Record Number: [id number] Patient Account Number: [id number] Date of Birth/Sex: [birth year] (67 y.o. M) Treating RN: Army Melia Primary Care Liyat Faulkenberry: Royetta Crochet Other Clinician: Referring Latitia Housewright: Royetta Crochet Treating Bonifacio Pruden/Extender: Melburn Hake, HOYT Weeks in Treatment: 40 Visit Information History Since Last Visit Added or deleted any medications: No Patient Arrived: Ambulatory Any new allergies or adverse reactions: No Arrival Time: 08:05 Had a fall or experienced change in No Accompanied By: self activities of daily living that may affect Transfer Assistance: None risk of falls: Patient Identification Verified: Yes Signs or symptoms of abuse/neglect since last visito No Patient Requires Transmission-Based No Hospitalized since last visit: No Precautions: Pain Present Now: No Patient Has Alerts: Yes Patient Alerts: Patient on Blood Thinner DMII warfarin Electronic Signature(s) Signed: 07/07/2018 4:09:31 PM By: Army Melia Entered By: Army Melia on 07/04/2018 08:06:20 Mcclintic, [name] ([id number]) -------------------------------------------------------------------------------- Clinic Level of Care Assessment Details Patient Name: [name] Date of Service: 07/04/2018 8:00 AM Medical Record Number: [id number] Patient Account Number: [id number] Date of Birth/Sex: 07/20/1950 (67 y.o. M) Treating RN: Montey Hora Primary Care Tywon Niday: Royetta Crochet Other Clinician: Referring Sher Hellinger: Royetta Crochet Treating Aishia Barkey/Extender: Melburn Hake, HOYT Weeks in Treatment: 36 Clinic Level of Care Assessment Items TOOL 4 Quantity Score []  - Use when only an EandM is performed on FOLLOW-UP visit 0 ASSESSMENTS - Nursing Assessment / Reassessment X - Reassessment of Co-morbidities (includes updates  in patient status) 1 10 X- 1 5 Reassessment of Adherence to Treatment Plan ASSESSMENTS - Wound and Skin Assessment / Reassessment X - Simple Wound Assessment / Reassessment - one wound 1 5 []  - 0 Complex Wound Assessment / Reassessment - multiple wounds []  - 0 Dermatologic / Skin Assessment (not related to wound area) ASSESSMENTS - Focused Assessment X - Circumferential Edema Measurements - multi extremities 1 5 []  - 0 Nutritional Assessment / Counseling / Intervention X- 1 5 Lower Extremity Assessment (monofilament, tuning fork, pulses) []  - 0 Peripheral Arterial Disease Assessment (using hand held doppler) ASSESSMENTS - Ostomy and/or Continence Assessment and Care []  - Incontinence Assessment and Management 0 []  - 0 Ostomy Care Assessment and Management (repouching, etc.) PROCESS - Coordination of Care X - Simple Patient / Family Education for ongoing care 1 15 []  - 0 Complex (extensive) Patient / Family Education for ongoing care X- 1 10 Staff obtains Programmer, systems, Records, Test Results / Process Orders []  - 0 Staff telephones HHA, Nursing Homes / Clarify orders / etc []  - 0 Routine Transfer to another Facility (non-emergent condition) []  - 0 Routine Hospital Admission (non-emergent condition) []  - 0 New Admissions / Biomedical engineer / Ordering NPWT, Apligraf, etc. []  - 0 Emergency Hospital Admission (emergent condition) X- 1 10 Simple Discharge Coordination Italiano, Kobee ([id number]) []  - 0 Complex (extensive) Discharge Coordination PROCESS - Special Needs []  - Pediatric / Minor Patient Management 0 []  - 0 Isolation Patient Management []  - 0 Hearing / Language / Visual special needs []  - 0 Assessment of Community assistance (transportation, D/C planning, etc.) []  - 0 Additional assistance / Altered mentation []  - 0 Support Surface(s) Assessment (bed, cushion, seat, etc.) INTERVENTIONS - Wound Cleansing / Measurement X - Simple Wound Cleansing - one  wound 1 5 []  - 0 Complex Wound Cleansing - multiple wounds X- 1 5 Wound Imaging (photographs - any number of wounds) []  - 0 Wound Tracing (instead of photographs) X- 1 5 Simple  Wound Measurement - one wound []  - 0 Complex Wound Measurement - multiple wounds INTERVENTIONS - Wound Dressings []  - Small Wound Dressing one or multiple wounds 0 []  - 0 Medium Wound Dressing one or multiple wounds []  - 0 Large Wound Dressing one or multiple wounds []  - 0 Application of Medications - topical []  - 0 Application of Medications - injection INTERVENTIONS - Miscellaneous []  - External ear exam 0 []  - 0 Specimen Collection (cultures, biopsies, blood, body fluids, etc.) []  - 0 Specimen(s) / Culture(s) sent or taken to Lab for analysis []  - 0 Patient Transfer (multiple staff / Civil Service fast streamer / Similar devices) []  - 0 Simple Staple / Suture removal (25 or less) []  - 0 Complex Staple / Suture removal (26 or more) []  - 0 Hypo / Hyperglycemic Management (close monitor of Blood Glucose) []  - 0 Ankle / Brachial Index (ABI) - do not check if billed separately X- 1 5 Vital Signs [name], Mark ([id number]) Has the patient been seen at the hospital within the last three years: Yes Total Score: 85 Level Of Care: New/Established - Level 3 Electronic Signature(s) Signed: 07/04/2018 4:18:49 PM By: Montey Hora Entered By: Montey Hora on 07/04/2018 08:31:29 Deeney, [name] ([id number]) -------------------------------------------------------------------------------- Encounter Discharge Information Details Patient Name: [name] Date of Service: 07/04/2018 8:00 AM Medical Record Number: [id number] Patient Account Number: [id number] Date of Birth/Sex: 1951/02/13 (67 y.o. M) Treating RN: Montey Hora Primary Care Becker Christopher: Royetta Crochet Other Clinician: Referring Ellyanna Holton: Royetta Crochet Treating Fumiko Cham/Extender: Melburn Hake, HOYT Weeks in Treatment: 61 Encounter Discharge  Information Items Discharge Condition: Stable Ambulatory Status: Ambulatory Discharge Destination: Home Transportation: Private Auto Accompanied By: self Schedule Follow-up Appointment: No Clinical Summary of Care: Electronic Signature(s) Signed: 07/04/2018 4:18:49 PM By: Montey Hora Entered By: Montey Hora on 07/04/2018 08:31:43 Vanderweele, [name] ([id number]) -------------------------------------------------------------------------------- Lower Extremity Assessment Details Patient Name: [name] Date of Service: 07/04/2018 8:00 AM Medical Record Number: [id number] Patient Account Number: [id number] Date of Birth/Sex: 24-Nov-1950 (67 y.o. M) Treating RN: Army Melia Primary Care Melody Cirrincione: Royetta Crochet Other Clinician: Referring Nakyra Bourn: Royetta Crochet Treating Jahmere Bramel/Extender: STONE III, HOYT Weeks in Treatment: 36 Edema Assessment Assessed: [Left: No] [Right: No] Edema: [Left: N] [Right: o] Calf Left: Right: Point of Measurement: 34 cm From Medial Instep cm 39 cm Ankle Left: Right: Point of Measurement: 12 cm From Medial Instep cm 26 cm Vascular Assessment Pulses: Dorsalis Pedis Palpable: [Right:Yes] Posterior Tibial Extremity colors, hair growth, and conditions: Extremity Color: [Right:Hyperpigmented] Hair Growth on Extremity: [Right:No] Temperature of Extremity: [Right:Warm] Capillary Refill: [Right:< 3 seconds] Toe Nail Assessment Left: Right: Thick: Yes Discolored: Yes Deformed: No Improper Length and Hygiene: No Electronic Signature(s) Signed: 07/07/2018 4:09:31 PM By: Army Melia Entered By: Army Melia on 07/04/2018 08:16:47 Warnell, [name] ([id number]) -------------------------------------------------------------------------------- Multi-Disciplinary Care Plan Details Patient Name: [name] Date of Service: 07/04/2018 8:00 AM Medical Record Number: [id number] Patient Account Number: [id number] Date of Birth/Sex:  [birth year] (67 y.o. M) Treating RN: Montey Hora Primary Care Rylan Bernard: Royetta Crochet Other Clinician: Referring Damiah Mcdonald: Royetta Crochet Treating Teofil Maniaci/Extender: Melburn Hake, HOYT Weeks in Treatment: 36 Active Inactive Electronic Signature(s) Signed: 07/04/2018 4:18:49 PM By: Montey Hora Entered By: Montey Hora on 07/04/2018 08:24:47 Hoerner, [name] ([id number]) -------------------------------------------------------------------------------- Pain Assessment Details Patient Name: [name] Date of Service: 07/04/2018 8:00 AM Medical Record Number: [id number] Patient Account Number: [id number] Date of Birth/Sex: Feb 07, 1951 (67 y.o. M) Treating RN: Army Melia Primary Care Elexus Barman: Royetta Crochet Other Clinician: Referring Byanka Landrus: Royetta Crochet Treating Kaylor Maiers/Extender: STONE III, HOYT Weeks in Treatment:  36 Active Problems Location of Pain Severity and Description of Pain Patient Has Paino No Site Locations Pain Management and Medication Current Pain Management: Electronic Signature(s) Signed: 07/07/2018 4:09:31 PM By: Army Melia Entered By: Army Melia on 07/04/2018 08:06:28 Abrams, [name] ([id number]) -------------------------------------------------------------------------------- Patient/Caregiver Education Details Patient Name: [name] Date of Service: 07/04/2018 8:00 AM Medical Record Number: [id number] Patient Account Number: [id number] Date of Birth/Gender: [birth year] (67 y.o. M) Treating RN: Montey Hora Primary Care Physician: Royetta Crochet Other Clinician: Referring Physician: Royetta Crochet Treating Physician/Extender: Sharalyn Ink in Treatment: 71 Education Assessment Education Provided To: Patient Education Topics Provided Venous: Handouts: Other: continue wearing compression Methods: Explain/Verbal Responses: State content correctly Electronic Signature(s) Signed: 07/04/2018 4:18:49 PM By: Montey Hora Entered By: Montey Hora on 07/04/2018 08:32:02 Blitzer, [name] ([id number]) -------------------------------------------------------------------------------- Vitals Details Patient Name: [name] Date of Service: 07/04/2018 8:00 AM Medical Record Number: [id number] Patient Account Number: [id number] Date of Birth/Sex: [birth year] (67 y.o. M) Treating RN: Army Melia Primary Care Kyndle Schlender: Royetta Crochet Other Clinician: Referring Yaretsi Humphres: Royetta Crochet Treating Gaige Fussner/Extender: STONE III, HOYT Weeks in Treatment: 36 Vital Signs Time Taken: 08:06 Temperature (F): 97.8 Height (in): 74 Pulse (bpm): 49 Weight (lbs): 220 Respiratory Rate (breaths/min): 16 Body Mass Index (BMI): 28.2 Blood Pressure (mmHg): 120/62 Reference Range: 80 - 120 mg / dl Electronic Signature(s) Signed: 07/07/2018 4:09:31 PM By: Army Melia Entered By: Army Melia on 07/04/2018 08:10:27

## 2018-08-24 ENCOUNTER — Ambulatory Visit: Payer: Medicare Other | Admitting: Podiatry

## 2018-08-24 ENCOUNTER — Ambulatory Visit (INDEPENDENT_AMBULATORY_CARE_PROVIDER_SITE_OTHER): Payer: Medicare Other | Admitting: Podiatry

## 2018-08-24 ENCOUNTER — Other Ambulatory Visit: Payer: Self-pay

## 2018-08-24 ENCOUNTER — Encounter: Payer: Self-pay | Admitting: Podiatry

## 2018-08-24 DIAGNOSIS — L84 Corns and callosities: Secondary | ICD-10-CM

## 2018-08-24 DIAGNOSIS — B351 Tinea unguium: Secondary | ICD-10-CM

## 2018-08-24 DIAGNOSIS — M79676 Pain in unspecified toe(s): Secondary | ICD-10-CM | POA: Diagnosis not present

## 2018-08-24 DIAGNOSIS — E1142 Type 2 diabetes mellitus with diabetic polyneuropathy: Secondary | ICD-10-CM

## 2018-08-24 NOTE — Progress Notes (Signed)
Complaint:  Visit Type: Patient returns to my office for continued preventative foot care services. Complaint: Patient states" my nails have grown long and thick and become painful to walk and wear shoes" Patient has been diagnosed with DM with neuropathy.. The patient presents for preventative foot care services. No changes to ROS.  Patient right leg ulcer has closed.  Podiatric Exam: Vascular: dorsalis pedis and posterior tibial pulses are palpable bilateral. Capillary return is immediate. Temperature gradient is WNL. Skin turgor WNL .  Venous stasis leg/foot  B/L. Sensorium: Diminished  Semmes Weinstein monofilament test. Normal tactile sensation bilaterally. Nail Exam: Pt has thick disfigured discolored nails with subungual debris noted bilateral entire nail hallux through fifth toenails Ulcer Exam: There is no evidence of ulcer or pre-ulcerative changes or infection. Orthopedic Exam: Muscle tone and strength are WNL. No limitations in general ROM. No crepitus or effusions noted. Foot type and digits show no abnormalities. DJD midfoot right. Skin: No Porokeratosis. No infection or ulcers  Diagnosis:  Onychomycosis, , Pain in right toe, pain in left toes  Treatment & Plan Procedures and Treatment: Consent by patient was obtained for treatment procedures.   Debridement of mycotic and hypertrophic toenails, 1 through 5 bilateral and clearing of subungual debris. No ulceration, no infection noted.  Return Visit-Office Procedure: Patient instructed to return to the office for a follow up visit 3 months for continued evaluation and treatment.    Gardiner Barefoot DPM

## 2018-11-08 IMAGING — DX DG TIBIA/FIBULA 2V*L*
4 series · 4 of 4 positions shown · non-contrast
Comparison: None.

CLINICAL DATA: Left lower leg pain for 2 days.  No known injury.

EXAM:
LEFT TIBIA AND FIBULA - 2 VIEW

[tibia ap (1 of 2)]
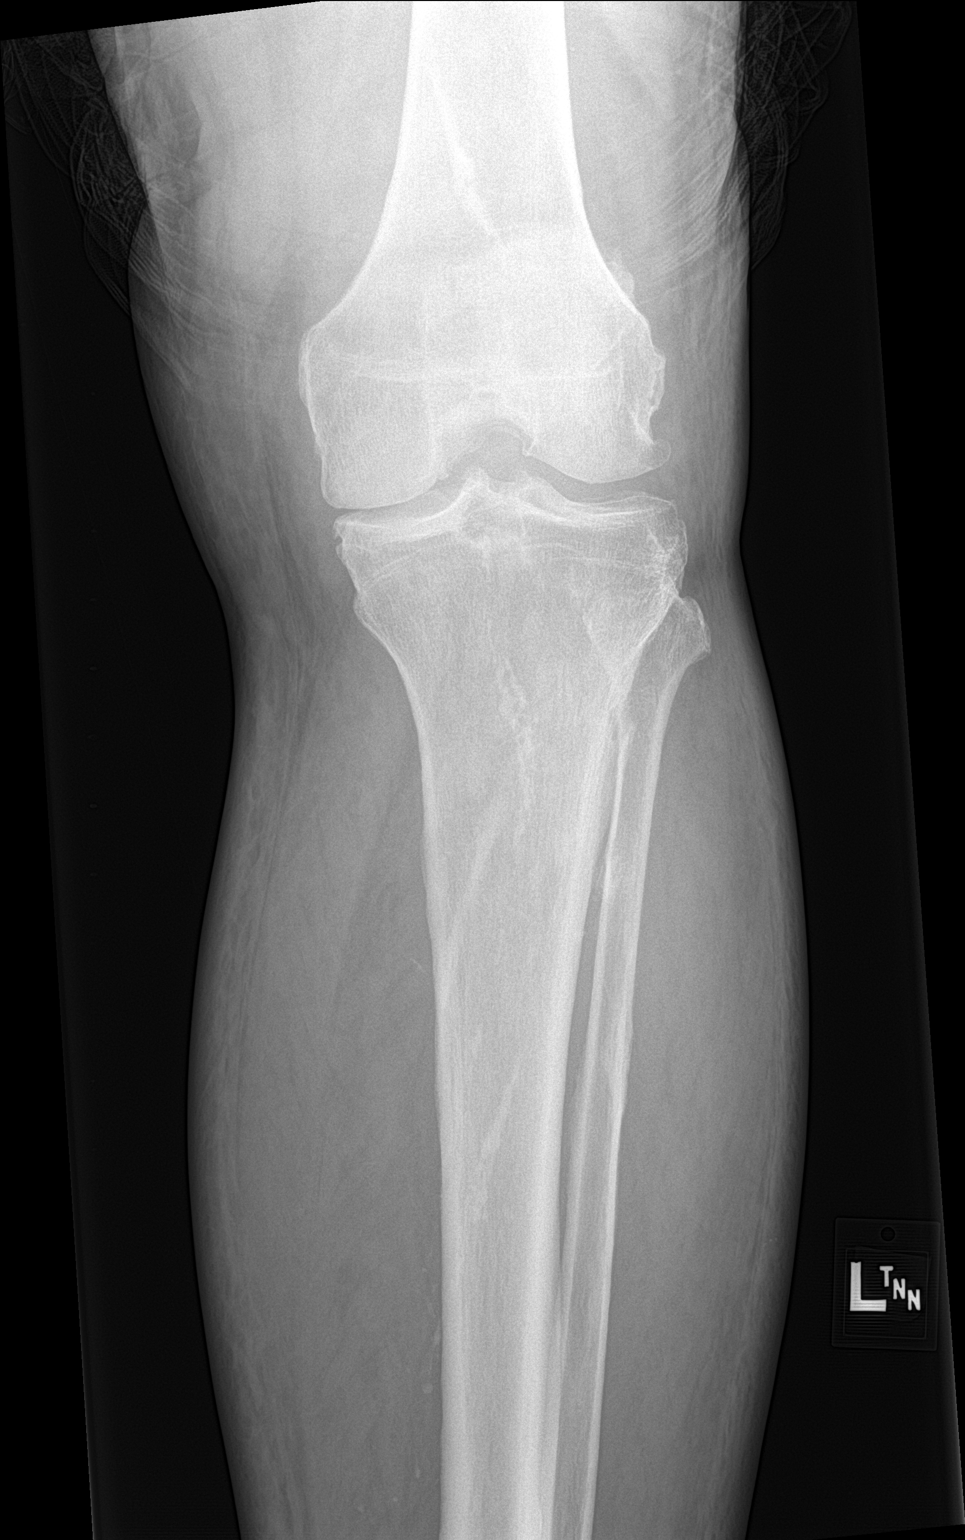

[tibia ap (2 of 2)]
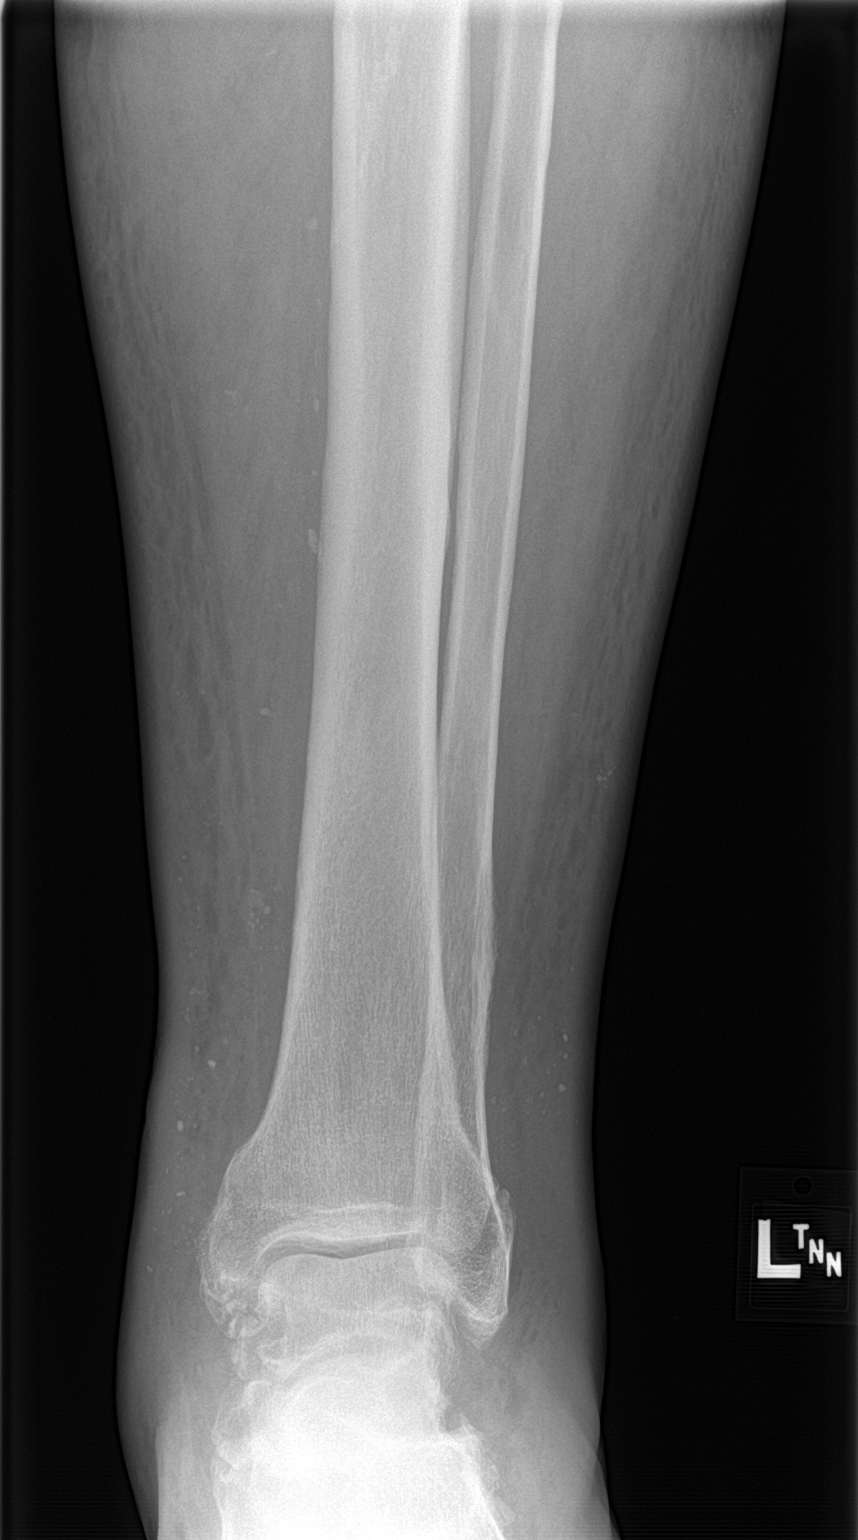

[tibia lat (1 of 2)]
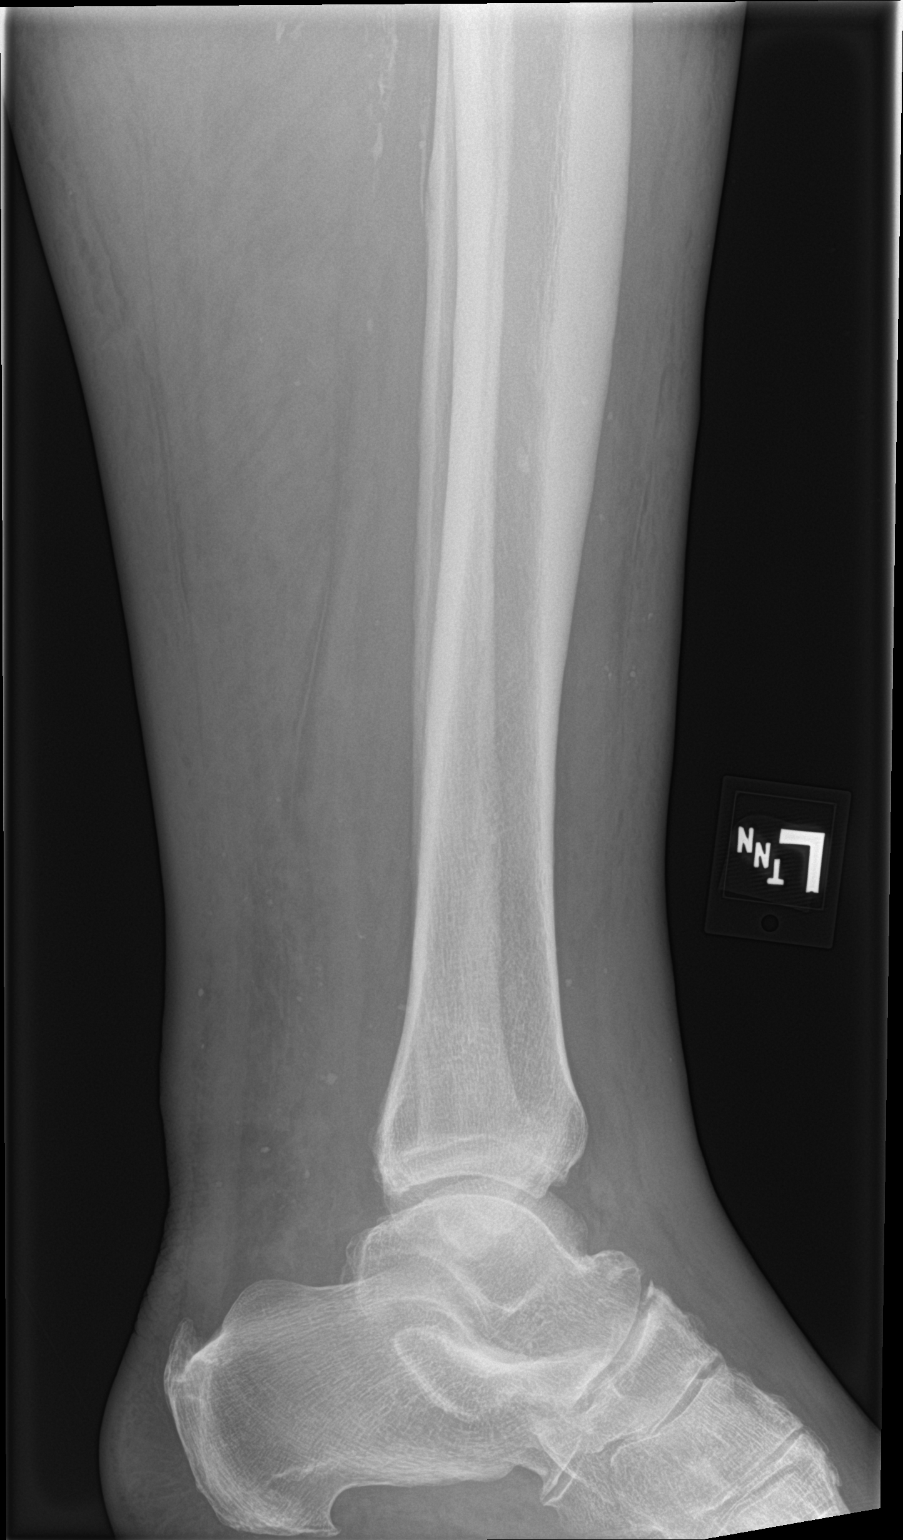

[tibia lat (2 of 2)]
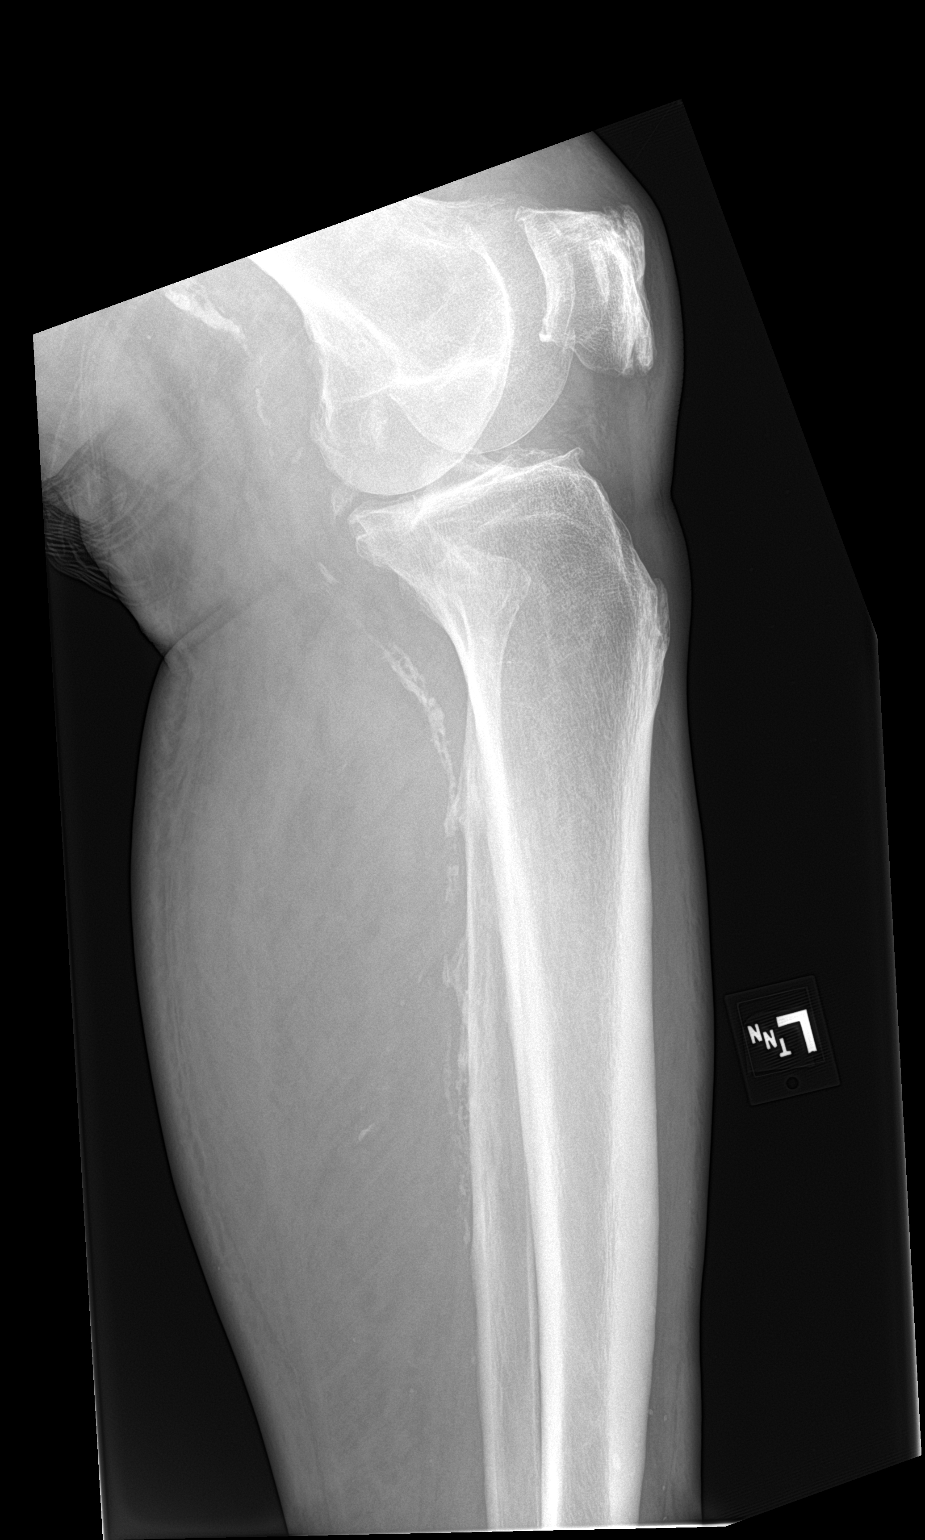

[4 of 4 positions shown; findings below may reference images not displayed]

FINDINGS: There is no acute bony or joint abnormality. Moderate to advanced
degenerative disease is seen about the knee and tibiotalar joint.
Extensive atherosclerosis is noted. Soft tissues unremarkable.
IMPRESSION: No acute abnormality.

Osteoarthritis left knee and tibiotalar joint.

Atherosclerosis.

## 2018-11-20 ENCOUNTER — Other Ambulatory Visit: Payer: Self-pay

## 2018-11-20 ENCOUNTER — Ambulatory Visit (INDEPENDENT_AMBULATORY_CARE_PROVIDER_SITE_OTHER): Payer: Medicare Other | Admitting: Podiatry

## 2018-11-20 ENCOUNTER — Encounter: Payer: Self-pay | Admitting: Podiatry

## 2018-11-20 DIAGNOSIS — E1142 Type 2 diabetes mellitus with diabetic polyneuropathy: Secondary | ICD-10-CM | POA: Diagnosis not present

## 2018-11-20 DIAGNOSIS — B351 Tinea unguium: Secondary | ICD-10-CM | POA: Diagnosis not present

## 2018-11-20 DIAGNOSIS — E1140 Type 2 diabetes mellitus with diabetic neuropathy, unspecified: Secondary | ICD-10-CM | POA: Insufficient documentation

## 2018-11-20 DIAGNOSIS — M79675 Pain in left toe(s): Secondary | ICD-10-CM | POA: Diagnosis not present

## 2018-11-20 DIAGNOSIS — M79674 Pain in right toe(s): Secondary | ICD-10-CM

## 2018-11-20 NOTE — Progress Notes (Signed)
Complaint:  Visit Type: Patient returns to my office for continued preventative foot care services. Complaint: Patient states" my nails have grown long and thick and become painful to walk and wear shoes" Patient has been diagnosed with DM with neuropathy.. The patient presents for preventative foot care services. No changes to ROS.  Patient right leg ulcer has closed.  Podiatric Exam: Vascular: dorsalis pedis and posterior tibial pulses are palpable bilateral. Capillary return is immediate. Temperature gradient is WNL. Skin turgor WNL .  Venous stasis leg/foot  B/L. Sensorium: Diminished  Semmes Weinstein monofilament test. Normal tactile sensation bilaterally. Nail Exam: Pt has thick disfigured discolored nails with subungual debris noted bilateral entire nail hallux through fifth toenails Ulcer Exam: There is no evidence of ulcer or pre-ulcerative changes or infection. Orthopedic Exam: Muscle tone and strength are WNL. No limitations in general ROM. No crepitus or effusions noted. Foot type and digits show no abnormalities. DJD midfoot right. Skin: No Porokeratosis. No infection or ulcers  Diagnosis:  Onychomycosis, , Pain in right toe, pain in left toes  Treatment & Plan Procedures and Treatment: Consent by patient was obtained for treatment procedures.   Debridement of mycotic and hypertrophic toenails, 1 through 5 bilateral and clearing of subungual debris. No ulceration, no infection noted.  Return Visit-Office Procedure: Patient instructed to return to the office for a follow up visit 3 months for continued evaluation and treatment.    Gardiner Barefoot DPM

## 2019-02-19 ENCOUNTER — Other Ambulatory Visit: Payer: Self-pay

## 2019-02-19 ENCOUNTER — Encounter: Payer: Self-pay | Admitting: Podiatry

## 2019-02-19 ENCOUNTER — Ambulatory Visit (INDEPENDENT_AMBULATORY_CARE_PROVIDER_SITE_OTHER): Payer: Medicare Other | Admitting: Podiatry

## 2019-02-19 DIAGNOSIS — M79675 Pain in left toe(s): Secondary | ICD-10-CM

## 2019-02-19 DIAGNOSIS — M79674 Pain in right toe(s): Secondary | ICD-10-CM

## 2019-02-19 DIAGNOSIS — B351 Tinea unguium: Secondary | ICD-10-CM | POA: Diagnosis not present

## 2019-02-19 DIAGNOSIS — E1142 Type 2 diabetes mellitus with diabetic polyneuropathy: Secondary | ICD-10-CM | POA: Diagnosis not present

## 2019-02-19 NOTE — Progress Notes (Signed)
Complaint:  Visit Type: Patient returns to my office for continued preventative foot care services. Complaint: Patient states" my nails have grown long and thick and become painful to walk and wear shoes" Patient has been diagnosed with DM with neuropathy.. The patient presents for preventative foot care services. No changes to ROS.  Patient right leg ulcer has closed.  Podiatric Exam: Vascular: dorsalis pedis and posterior tibial pulses are palpable bilateral. Capillary return is immediate. Temperature gradient is WNL. Skin turgor WNL .  Venous stasis leg/foot  B/L. Sensorium: Diminished  Semmes Weinstein monofilament test. Normal tactile sensation bilaterally. Nail Exam: Pt has thick disfigured discolored nails with subungual debris noted bilateral entire nail hallux through fifth toenails Ulcer Exam: There is no evidence of ulcer or pre-ulcerative changes or infection. Orthopedic Exam: Muscle tone and strength are WNL. No limitations in general ROM. No crepitus or effusions noted. Foot type and digits show no abnormalities. DJD midfoot right. Skin: No Porokeratosis. No infection or ulcers  Diagnosis:  Onychomycosis, , Pain in right toe, pain in left toes  Treatment & Plan Procedures and Treatment: Consent by patient was obtained for treatment procedures.   Debridement of mycotic and hypertrophic toenails, 1 through 5 bilateral and clearing of subungual debris. No ulceration, no infection noted.  Return Visit-Office Procedure: Patient instructed to return to the office for a follow up visit 3 months for continued evaluation and treatment.    Gardiner Barefoot DPM

## 2019-03-05 DIAGNOSIS — D696 Thrombocytopenia, unspecified: Secondary | ICD-10-CM | POA: Insufficient documentation

## 2019-03-05 DIAGNOSIS — M2550 Pain in unspecified joint: Secondary | ICD-10-CM | POA: Insufficient documentation

## 2019-03-09 ENCOUNTER — Other Ambulatory Visit: Payer: Self-pay | Admitting: Specialist

## 2019-03-18 DIAGNOSIS — N280 Ischemia and infarction of kidney: Secondary | ICD-10-CM

## 2019-03-18 HISTORY — DX: Ischemia and infarction of kidney: N28.0

## 2019-03-23 ENCOUNTER — Other Ambulatory Visit: Payer: Self-pay | Admitting: Specialist

## 2019-03-27 ENCOUNTER — Other Ambulatory Visit: Payer: Self-pay

## 2019-03-27 ENCOUNTER — Encounter
Admission: RE | Admit: 2019-03-27 | Discharge: 2019-03-27 | Disposition: A | Discharge status: Home / Self Care | Payer: Medicare Other | Source: Ambulatory Visit | Attending: Specialist | Admitting: Specialist

## 2019-03-27 DIAGNOSIS — Z01818 Encounter for other preprocedural examination: Secondary | ICD-10-CM | POA: Diagnosis present

## 2019-03-27 DIAGNOSIS — I10 Essential (primary) hypertension: Secondary | ICD-10-CM | POA: Insufficient documentation

## 2019-03-27 DIAGNOSIS — I4891 Unspecified atrial fibrillation: Secondary | ICD-10-CM | POA: Insufficient documentation

## 2019-03-27 HISTORY — DX: Unspecified osteoarthritis, unspecified site: M19.90

## 2019-03-27 LAB — BASIC METABOLIC PANEL
Anion gap: 13 (ref 5–15)
BUN: 19 mg/dL (ref 8–23)
CO2: 26 mmol/L (ref 22–32)
Calcium: 9 mg/dL (ref 8.9–10.3)
Chloride: 101 mmol/L (ref 98–111)
Creatinine, Ser: 1 mg/dL (ref 0.61–1.24)
GFR calc Af Amer: >60 mL/min (ref >60)
GFR calc non Af Amer: >60 mL/min (ref >60)
Glucose, Bld: 114 mg/dL — ABNORMAL HIGH (ref 70–99)
Potassium: 3.4 mmol/L — ABNORMAL LOW (ref 3.5–5.1)
Sodium: 140 mmol/L (ref 135–145)

## 2019-03-27 NOTE — Pre-Procedure Instructions (Signed)
Echo complete10/16/2019 [place] Component Name Value Ref Range  LV Ejection Fraction (%) 40   Aortic Valve Regurgitation Grade trivial   Aortic Valve Stenosis Grade none   Aortic Valve Max Velocity (m/s) 1 m/sec  Aortic Valve Stenosis Mean Gradient (mmHg) 2.1 mmHg  Mitral Valve Regurgitation Grade moderate   Mitral Valve Stenosis Grade none   Tricuspid Valve Regurgitation Grade moderate   Tricuspid Valve Regurgitation Max Velocity (m/s) 3.8 m/sec  Right Ventricle Systolic Pressure (mmHg) 23.5 mmHg  LV End Diastolic Diameter (cm) 5.1 cm  LV End Systolic Diameter (cm) 4.1 cm  LV Septum Wall Thickness (cm) 1 cm  LV Posterior Wall Thickness (cm) 1.3 cm  Left Atrium Diameter (cm) 4.6 cm  Result Narrative             CARDIOLOGY DEPARTMENT          [place], [place]                  [id number]      [facility]              Acct #: [id number]      [address], [place], [place] [address]    Date: 02/24/2018 11: 35 AM                                Adult  Male Age: 68 yrs      ECHOCARDIOGRAM REPORT               Outpatient                                KC^^KCWC    STUDY:CHEST WALL        TAPE:0000: 00: 0: 00: 00 MD1: CALLWOOD, DWAYNE DENNIS    ECHO:Yes  DOPPLER:Yes    FILE:0000-000-000    BP: 128/76 mmHg    COLOR:Yes  CONTRAST:No   MACHINE:Philips  RV BIOPSY:No     3D:No SOUND QLTY:Moderate      Height: 74 in   MEDIUM:None                       Weight: 225 lb                                BSA: 2.3 m2 _________________________________________________________________________________________        HISTORY: DOE, Chest pain         REASON: Assess, LV function        INDICATION: R06.02 SOB (shortness of breath), I20.8 Other forms of angina             pectoris _________________________________________________________________________________________ ECHOCARDIOGRAPHIC MEASUREMENTS 2D DIMENSIONS AORTA         Values  Normal Range  MAIN PA     Values  Normal Range        Annulus: nm*     [2.3-2.9]     PA Main: nm*    [1.5-2.1]       Aorta Sin: 3.1 cm    [3.1-3.7]  RIGHT VENTRICLE      ST Junction: nm*     [2.6-3.2]     RV Base: 5.4 cm  [< 4.2]       Asc.Aorta: nm*     [  2.6-3.4]     RV Mid: nm*    [<3.5] LEFT VENTRICLE                   RV Length: nm*    [<8.6]         LVIDd: 5.1 cm    [4.2-5.9]  INFERIOR VENA CAVA         LVIDs: 4.1 cm            Max. IVC: nm*    [<=2.1]           FS: 19.9 %    [>25]      Min. IVC: nm*          SWT: 1.0 cm    [0.6-1.0]  ------------------          PWT: 1.3 cm    [0.6-1.0]  nm* - not measured LEFT ATRIUM        LA Diam: 4.6 cm    [3.0-4.0]      LA A4C Area: nm*     [<20]       LA Volume: nm*     [18-58] _________________________________________________________________________________________ ECHOCARDIOGRAPHIC DESCRIPTIONS AORTIC ROOT          Size: Normal       Dissection: No dissection AORTIC VALVE        Leaflets: Tricuspid          Morphology: MILDLY THICKENED        Mobility: Fully mobile LEFT VENTRICLE          Size: Normal            Anterior: HYPOCONTRACTILE      Contraction: MILD GLOBAL DECREASE      Lateral: HYPOCONTRACTILE       Closest EF: 40% (Estimated)         Septal: HYPOCONTRACTILE       LV Masses: No Masses            Apical: HYPOCONTRACTILE          LVH: None              Inferior: HYPOCONTRACTILE                           Posterior: HYPOCONTRACTILE      Dias.FxClass: can't be determined MITRAL VALVE        Leaflets: Normal            Mobility: Fully mobile       Morphology: Normal LEFT ATRIUM          Size: MODERATELY ENLARGED     LA Masses: No masses       IA Septum: Normal IAS MAIN PA          Size: Normal PULMONIC VALVE       Morphology: Normal            Mobility: Fully mobile RIGHT VENTRICLE          Size: MODERATELY ENLARGED     RV Masses: No Masses       Free Wall: Normal           Contraction: Normal TRICUSPID VALVE        Leaflets: Normal            Mobility: Fully mobile       Morphology: Normal RIGHT ATRIUM          Size: MODERATELY ENLARGED      RA  Other: None        RA Mass: No masses PERICARDIUM         Fluid: No effusion INFERIOR VENACAVA          Size: DILATED ABNORMAL RESPIRATORY COLLAPSE _________________________________________________________________________________________  DOPPLER ECHO and OTHER SPECIAL PROCEDURES         Aortic: TRIVIAL AR         No AS             100.2 cm/sec peak vel   4.0 mmHg peak grad             2.1 mmHg mean grad     3.4 cm^2 by DOPPLER         Mitral: MODERATE MR        No MS             MV Inflow E Vel = 101.0 cm/sec   MV Annulus E'Vel = nm*             E/E'Ratio = nm*       Tricuspid: MODERATE TR        No TS             379.7 cm/sec peak TR vel  72.7 mmHg peak RV pressure       Pulmonary: TRIVIAL PR         No PS _________________________________________________________________________________________ INTERPRETATION MILD LV SYSTOLIC DYSFUNCTION WITH AN ESTIMATED EF = 40 % NORMAL  RIGHT VENTRICULAR SYSTOLIC FUNCTION MODERATE TRICUSPID AND MITRAL VALVE INSUFFICIENCY TRACE AORTIC VALVE INSUFFICIENCY NO VALVULAR STENOSIS MODERATE RV ENLARGEMENT MODERATE BIATRIAL ENLARGEMENT _________________________________________________________________________________________ Electronically signed by  Lujean Amel, MD on 03/01/2018 04: 22 PM      Performed By: Johnathan Hausen, RDCS, RVT   Ordering Physician: Lujean Amel _________________________________________________________________________________________  Other Result Information  Interface, Text Results In - 03/01/2018  4:22 PM EDT                      CARDIOLOGY DEPARTMENT                   [place], [place]                                    [phone]           [facility]                           Acct #: [id number]           [address], [place], [place] [address]       Date: 02/24/2018 11: 17 AM                                                              Adult   Male  Age: 89 yrs           ECHOCARDIOGRAM REPORT                              Outpatient  KC^^KCWC      STUDY:CHEST WALL               TAPE:0000: 00: 0: 00: 00 MD1: CALLWOOD, DWAYNE DENNIS       ECHO:Yes    DOPPLER:Yes       FILE:0000-000-000        BP: 128/76 mmHg      COLOR:Yes   CONTRAST:No     MACHINE:Philips  RV BIOPSY:No          3D:No  SOUND QLTY:Moderate            Height: 74 in     MEDIUM:None                                              Weight: 225 lb                                                              BSA: 2.3 m2 _________________________________________________________________________________________               HISTORY: DOE, Chest pain                REASON: Assess, LV function            INDICATION: R06.02 SOB (shortness of breath), I20.8 Other forms of angina                         pectoris _________________________________________________________________________________________ ECHOCARDIOGRAPHIC MEASUREMENTS 2D DIMENSIONS AORTA                  Values   Normal Range   MAIN PA         Values    Normal Range               Annulus: nm*          [2.3-2.9]         PA Main: nm*       [1.5-2.1]             Aorta Sin: 3.1 cm       [3.1-3.7]    RIGHT VENTRICLE           ST Junction: nm*          [2.6-3.2]         RV Base: 5.4 cm    [< 4.2]             Asc.Aorta: nm*          [2.6-3.4]          RV Mid: nm*       [<3.5] LEFT VENTRICLE                                      RV Length: nm*       [<8.6]                 LVIDd: 5.1 cm       [4.2-5.9]    INFERIOR VENA CAVA  LVIDs: 4.1 cm                        Max. IVC: nm*       [<=2.1]                    FS: 19.9 %       [>25]            Min. IVC: nm*                   SWT: 1.0 cm       [0.6-1.0]    ------------------                   PWT: 1.3 cm       [0.6-1.0]    nm* - not measured LEFT ATRIUM               LA Diam: 4.6 cm       [3.0-4.0]           LA A4C Area: nm*          [<20]             LA Volume: nm*          [18-58] _________________________________________________________________________________________ ECHOCARDIOGRAPHIC DESCRIPTIONS AORTIC ROOT                  Size: Normal            Dissection: No dissection AORTIC VALVE              Leaflets: Tricuspid                   Morphology: MILDLY THICKENED              Mobility: Fully mobile LEFT VENTRICLE                  Size: Normal                        Anterior: HYPOCONTRACTILE           Contraction: MILD GLOBAL DECREASE           Lateral: HYPOCONTRACTILE            Closest EF: 40% (Estimated)                 Septal: HYPOCONTRACTILE             LV Masses: No Masses                       Apical: HYPOCONTRACTILE                   LVH: None                          Inferior: HYPOCONTRACTILE                                                     Posterior:  HYPOCONTRACTILE          Dias.FxClass: can't be determined MITRAL VALVE              Leaflets: Normal  Mobility: Fully mobile            Morphology: Normal LEFT ATRIUM                  Size: MODERATELY ENLARGED          LA Masses: No masses             IA Septum: Normal IAS MAIN PA                  Size: Normal PULMONIC VALVE            Morphology: Normal                        Mobility: Fully mobile RIGHT VENTRICLE                  Size: MODERATELY ENLARGED          RV Masses: No Masses             Free Wall: Normal                     Contraction: Normal TRICUSPID VALVE              Leaflets: Normal                        Mobility: Fully mobile            Morphology: Normal RIGHT ATRIUM                  Size: MODERATELY ENLARGED           RA Other: None               RA Mass: No masses PERICARDIUM                 Fluid: No effusion INFERIOR VENACAVA                  Size: DILATED ABNORMAL RESPIRATORY COLLAPSE _________________________________________________________________________________________  DOPPLER ECHO and OTHER SPECIAL PROCEDURES                Aortic: TRIVIAL AR                 No AS                        100.2 cm/sec peak vel      4.0 mmHg peak grad                        2.1 mmHg mean grad         3.4 cm^2 by DOPPLER                Mitral: MODERATE MR                No MS                        MV Inflow E Vel = 101.0 cm/sec      MV Annulus E'Vel = nm*                        E/E'Ratio = nm*             Tricuspid: MODERATE TR  No TS                        379.7 cm/sec peak TR vel   72.7 mmHg peak RV pressure             Pulmonary: TRIVIAL PR                 No PS _________________________________________________________________________________________ INTERPRETATION MILD LV SYSTOLIC DYSFUNCTION WITH AN ESTIMATED EF = 40 % NORMAL RIGHT VENTRICULAR SYSTOLIC FUNCTION MODERATE TRICUSPID AND MITRAL VALVE INSUFFICIENCY TRACE  AORTIC VALVE INSUFFICIENCY NO VALVULAR STENOSIS MODERATE RV ENLARGEMENT MODERATE BIATRIAL ENLARGEMENT _________________________________________________________________________________________ Electronically signed by    Lujean Amel, MD on 03/01/2018 04: 22 PM          Performed By: Johnathan Hausen, RDCS, RVT    Ordering Physician: Lujean Amel _________________________________________________________________________________________  Status Results Details   Encounter Summary

## 2019-03-27 NOTE — Patient Instructions (Signed)
Your procedure is scheduled on: 04-02-19 MONDAY Report to Same Day Surgery 2nd floor medical mall [facility] Entrance-take elevator on left to 2nd floor.  Check in with surgery information desk.) To find out your arrival time please call [phone] between 1PM - 3PM on 03-30-19 FRIDAY  Remember: Instructions that are not followed completely may result in serious medical risk, up to and including death, or upon the discretion of your surgeon and anesthesiologist your surgery may need to be rescheduled.    _x___ 1. Do not eat food after midnight the night before your procedure. NO GUM OR CANDY AFTER MIDNIGHT. You may drink WATER up to 2 hours before you are scheduled to arrive at the hospital for your procedure.  Do not drink WATER  within 2 hours of your scheduled arrival to the hospital.  Type 1 and type 2 diabetics should only drink water.   ____Ensure clear carbohydrate drink on the way to the hospital for bariatric patients  ____Ensure clear carbohydrate drink 3 hours before surgery.      __x__ 2. No Alcohol for 24 hours before or after surgery.   __x__3. No Smoking or e-cigarettes for 24 prior to surgery.  Do not use any chewable tobacco products for at least 6 hour prior to surgery   ____  4. Bring all medications with you on the day of surgery if instructed.    __x__ 5. Notify your doctor if there is any change in your medical condition     (cold, fever, infections).    x___6. On the morning of surgery brush your teeth with toothpaste and water.  You may rinse your mouth with mouth wash if you wish.  Do not swallow any toothpaste or mouthwash.   Do not wear jewelry, make-up, hairpins, clips or nail polish.  Do not wear lotions, powders, or perfumes.   Do not shave 48 hours prior to surgery. Men may shave face and neck.  Do not bring valuables to the hospital.    [facility] is not responsible for any belongings or valuables.               Contacts, dentures or  bridgework may not be worn into surgery.  Leave your suitcase in the car. After surgery it may be brought to your room.  For patients admitted to the hospital, discharge time is determined by your treatment team.  _  Patients discharged the day of surgery will not be allowed to drive home.  You will need someone to drive you home and stay with you the night of your procedure.    Please read over the following fact sheets that you were given:   [facility] Preparing for Surgery and or MRSA Information   _x___ TAKE THE FOLLOWING MEDICATION THE MORNING OF SURGERY WITH A SMALL SIP OF WATER. These include:  1. METOPROLOL  2.  3.  4.  5.  6.  ____Fleets enema or Magnesium Citrate as directed.   _x___ Use CHG Soap or sage wipes as directed on instruction sheet   ____ Use inhalers on the day of surgery and bring to hospital day of surgery  _X___ Stop Metformin 2 days prior to surgery-LAST DOSE ON Friday 03-30-19     ____ Take 1/2 of usual insulin dose the night before surgery and none on the morning surgery.   _x___ Follow recommendations from Cardiologist, Pulmonologist or PCP regarding stopping Aspirin, Coumadin, Plavix ,Eliquis, Effient, or Pradaxa, and Pletal-PT HAS BEEN INSTRUCTED BY  OFFICE TO STOP ELIQUIS 5 DAYS PRIOR TO SURGERY-LAST DOSE TODAY Tuesday 03-27-19    X____Stop Anti-inflammatories such as Advil, Aleve, Ibuprofen, Motrin, Naproxen, Naprosyn, Goodies powders or aspirin products NOW-OK to take Tylenol   ____ Stop supplements until after surgery.    ____ Bring C-Pap to the hospital.

## 2019-03-27 NOTE — Pre-Procedure Instructions (Signed)
Progress Notes - documented in this encounter Jobe Gibbon, MD - 02/27/2019 2:00 PM EDT Formatting of this note might be different from the original. Established Patient Visit   Chief Complaint: Chief Complaint  Patient presents with  . Follow-up  echo and stress test  Date of Service: 02/27/2019 Date of Birth: Oct 17, 1950 PCP: Karl Luke, Althia Forts, MD  History of Present Illness: [name] is a 68 y.o.male patient who history of CVA weakness hemiparesis PE DVT hypertension diabetes states to be doing reasonably well recently had echocardiogram and Myoview which showed mild to moderate left ventricular dysfunction around 40 to 45%. Patient has evidence of possible anterior apical possible ischemia patient denies any recent worsening symptoms. Shortness of breath is somewhat improved patient would like to wait on further invasive evaluation patient had losartan and metoprolol increased denies any worsening swelling no worsening chest pain angina here for routine follow-up  Past Medical and Surgical History  Past Medical History Past Medical History:  Diagnosis Date  . Arthritis  . Atrial fibrillation (CMS-HCC)  . DVT (deep venous thrombosis) (CMS-HCC)  . Gout  . Hyperlipidemia  . Hypertension  . PE (pulmonary embolism) (CMS-HCC) 05/2012  . Stroke (CMS-HCC)   Past Surgical History He has no past surgical history on file.   Medications and Allergies  Current Medications  Current Outpatient Medications  Medication Sig Dispense Refill  . allopurinoL (ZYLOPRIM) 100 MG tablet TAKE 1 TABLET BY MOUTH ONCE DAILY FOR 2 WEEKS THEN INCREASE TO 1 TABLET TWICE DAILY. FOR REFILLS CALL IN RX [id number]  . aspirin-calcium carbonate 81 mg-300 mg calcium(777 mg) Tab Take by mouth  . FUROsemide (LASIX) 40 MG tablet Take 1 tablet (40 mg total) by mouth 2 (two) times daily 60 tablet 11  . ibuprofen (ADVIL,MOTRIN) 200 MG tablet Take by mouth  . metFORMIN (GLUCOPHAGE) 500 MG  tablet Take 500 mg by mouth 2 (two) times daily 2  . naproxen (NAPROSYN) 500 MG tablet TAKE 1 TABLET BY MOUTH TWICE DAILY FOR 5 DAYS  . potassium chloride (KLOR-CON) 10 MEQ ER tablet Take by mouth  . simvastatin (ZOCOR) 20 MG tablet Take 20 mg by mouth nightly.  [name] losartan (COZAAR) 50 MG tablet Take 50 mg by mouth once daily  . metoprolol tartrate (LOPRESSOR) 50 MG tablet Take 0.5 tablets (25 mg total) by mouth 2 (two) times daily 90 tablet 3  . predniSONE (DELTASONE) 10 MG tablet Take 1 tablet (10 mg total) by mouth once daily for 10 days 5,5,4,4,3,3,2,2,1,1 tab po tapering dose only for gout flare 30 tablet 0  . traMADoL (ULTRAM) 50 mg tablet Take 1 tablet by mouth every 6 (six) hours   No current facility-administered medications for this visit.   Allergies: Patient has no known allergies.  Social and Family History  Social History reports that he has been smoking cigarettes. He has smoked for the past 48.00 years. He has never used smokeless tobacco. He reports that he does not drink alcohol or use drugs.  Family History Family History  Problem Relation Age of Onset  . No Known Problems Mother  . Stroke Neg Hx  . High blood pressure (Hypertension) Neg Hx  . Diabetes type II Neg Hx   Review of Systems   Review of Systems: The patient denies chest pain, shortness of breath, orthopnea, paroxysmal nocturnal dyspnea, pedal edema, palpitations, heart racing, presyncope, syncope. Review of 12 Systems is negative except as described above.  Physical Examination   Vitals:BP 122/68  Pulse 83  Ht 188 cm (6\' 2" )  Wt (!) 102.1 kg (225 lb)  SpO2 100%  BMI 28.89 kg/m  Ht:188 cm (6\' 2" ) Wt:(!) 102.1 kg (225 lb) UYE:BXID surface area is 2.31 meters squared. Body mass index is 28.89 kg/m.  HEENT: Pupils equally reactive to light and accomodation  Neck: Supple without thyromegaly, carotid pulses 2+ Lungs: clear to auscultation bilaterally; no wheezes, rales, rhonchi Heart: Regular rate  and rhythm. No gallops, murmurs or rub Abdomen: soft nontender, nondistended, with normal bowel sounds Extremities: no cyanosis, clubbing, or edema Peripheral Pulses: 2+ in all extremities, 2+ femoral pulses bilaterally  Assessment   68 y.o. male with  1. Angina at rest (CMS-HCC)  2. SOB (shortness of breath)  3. Varicose veins of leg with swelling, bilateral  4. Other pulmonary embolism without acute cor pulmonale, unspecified chronicity (CMS-HCC)  5. Essential hypertension  6. Cerebral infarction, unspecified mechanism (CMS-HCC)  7. Other secondary osteoarthritis of right knee  8. Controlled type 2 diabetes mellitus without complication, unspecified whether long term insulin use (CMS-HCC)  9. 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 Deep vein thrombosis (DVT) of non-extremity vein, unspecified chronicity   Plan  1 cardiomyopathy unclear etiology ejection fraction between 40 and 45% by echo recommend conservative medical therapy beta-blocker and losartan continue current management 2 diabetes type 2 uncomplicated patient maintained on metformin therapy 3 hypertension reasonably controlled on HCTZ metoprolol increase metoprolol to 50 twice a day losartan increased to 100 mg once a day 4 history of CVA in the past currently maintained on Eliquis therapy for anticoagulation 5 right-sided hemiparesis related to CVA recommend continue anticoagulation and blood pressure control 6 hyperlipidemia continue simvastatin therapy for lipid management 7 history of DVT continue Eliquis therapy long-term 8 have the patient follow-up in 6 months  Return in about 6 months (around 08/28/2019).  Mark [name] Rome, MD  This dictation was prepared with dragon dictation. Any transcription errors that result from this process are unintentional.   Electronically signed by Jobe Gibbon, MD at 03/05/2019 5:13 PM EDT   Plan of Treatment -  documented as of this encounter

## 2019-03-27 NOTE — Pre-Procedure Instructions (Signed)
NM myocardial perfusion SPECT multiple (stress and rest)03/01/2018 [place] Result Impression   Abnormal myocardial perfusion scan evidence of anterior  inferior slightly apical defect with possible ischemia overall ejection  fraction of around 43%. This study is consistent with possible ischemia  and further evaluation invasively may be warranted if symptoms persist or  worsen  Result Narrative  CARDIOLOGY DEPARTMENT [address], [place], [place] [address] [phone]  Procedure: Pharmacologic Myocardial Perfusion Imaging  ONE day procedure  Indication: Angina at rest (CMS-HCC) Plan: NM myocardial perfusion SPECT multiple (stress     and rest), ECG stress test only  SOB (shortness of breath) Plan: NM myocardial perfusion SPECT multiple (stress     and rest), ECG stress test only  Ordering Physician:   Dr. Lujean Amel   Clinical History: 68 y.o. year old male recent anginal symptoms Vitals: Height: 74 in Weight: 225 lb Cardiac risk factors include:   AFIB, CVA, PVD, Smoking, Hyperlipidemia, Diabetes and HTN    Procedure:  Pharmacologic stress testing was performed with Regadenoson using a single  use 0.4mg /55ml (0.08 mg/ml) prefilled syringe intravenously infused as a  bolus dose. The stress test was stopped due to Infusion completion. Blood  pressure response was normal. The patient developed symptoms other than  fatigue during the procedure; specific symptoms included DISORIENTATION  AND SLURRED SPEACH.   Rest HR: 58bpm Rest BP: 126/25mmHg Max HR: 69bpm Min BP: 120/64mmHg  Stress Test Administered by: Oswald Hillock, CMA  ECG Interpretation: Rest ECG: atrial fibrillation, none Stress ECG: atrial fibrillation, Recovery ECG: atrial fibrillation ECG Interpretation: non-diagnostic due to pharmacologic testing.   Administrations This Visit   regadenoson (LEXISCAN) 0.4 mg/5  mL inj syringe 0.4 mg   Admin Date 02/24/2018 Action Given Dose 0.4 mg Route Intravenous Administered By Herbert Seta, CNMT     technetium Tc52m sestamibi (CARDIOLITE) injection 27.06 millicurie   Admin Date 02/24/2018 Action Given Dose 23.76 millicurie Route Intravenous Administered By Herbert Seta, CNMT     technetium Tc59m sestamibi (CARDIOLITE) injection 28.31 millicurie   Admin Date 02/24/2018 Action Given Dose 51.76 millicurie Route Intravenous Administered By Herbert Seta, CNMT       Gated post-stress perfusion imaging was performed 30 minutes after stress.  Rest images were performed 30 minutes after injection.  Gated LV Analysis:  TID Ratio: 1.07  LVEF= 40-45 %  FINDINGS: Regional wall motion: demonstrates hypokinesis of the ant/apical inf. The overall quality of the study is good.  Artifacts noted: no Left ventricular cavity: enlarged.  Perfusion Analysis: SPECT images demonstrate moderate perfusion  abnormality of moderate intensity is present in the ant/apical/inf region  on the stress images.   Status Results Details   Encounter Summary

## 2019-03-28 NOTE — Pre-Procedure Instructions (Signed)
Secure chat with dr Kayleen Memos regarding ekg and pt with h/o stroke and afib. Dr Kayleen Memos not concerned with EKG but does want cardiac clearance due to afib and h/o cva with residual se of right sided weakness and slurred speech. Called Tiffany at dr [facility] The office and left message about pt needing clearance. Faxed this clearance to dr Clayborn Bigness and to dr Sanjuan Dame office

## 2019-03-29 ENCOUNTER — Other Ambulatory Visit: Payer: Self-pay

## 2019-03-29 ENCOUNTER — Other Ambulatory Visit
Admission: RE | Admit: 2019-03-29 | Discharge: 2019-03-29 | Disposition: A | Discharge status: Home / Self Care | Payer: Medicare Other | Source: Ambulatory Visit | Attending: Specialist | Admitting: Specialist

## 2019-03-29 DIAGNOSIS — Z20828 Contact with and (suspected) exposure to other viral communicable diseases: Secondary | ICD-10-CM | POA: Insufficient documentation

## 2019-03-29 DIAGNOSIS — Z01812 Encounter for preprocedural laboratory examination: Secondary | ICD-10-CM | POA: Insufficient documentation

## 2019-03-29 LAB — SARS CORONAVIRUS 2 (TAT 6-24 HRS): SARS Coronavirus 2: NEGATIVE

## 2019-03-30 NOTE — Pre-Procedure Instructions (Signed)
Cardiac clearance on chart low risk

## 2019-04-01 MED ORDER — CLINDAMYCIN PHOSPHATE 600 MG/50ML IV SOLN
600.0000 mg | INTRAVENOUS | Status: AC
  Administered 2019-04-02: 600 mg via INTRAVENOUS

## 2019-04-02 ENCOUNTER — Ambulatory Visit: Payer: Medicare Other | Admitting: Anesthesiology

## 2019-04-02 ENCOUNTER — Other Ambulatory Visit: Payer: Self-pay

## 2019-04-02 ENCOUNTER — Encounter
Admission: RE | Disposition: A | Discharge status: Home / Self Care | Payer: Self-pay | Source: Home / Self Care | Attending: Specialist

## 2019-04-02 ENCOUNTER — Ambulatory Visit
Admission: RE | Admit: 2019-04-02 | Discharge: 2019-04-02 | Disposition: A | Discharge status: Home / Self Care | Payer: Medicare Other | Attending: Specialist | Admitting: Specialist

## 2019-04-02 DIAGNOSIS — I10 Essential (primary) hypertension: Secondary | ICD-10-CM | POA: Insufficient documentation

## 2019-04-02 DIAGNOSIS — E1140 Type 2 diabetes mellitus with diabetic neuropathy, unspecified: Secondary | ICD-10-CM | POA: Diagnosis not present

## 2019-04-02 DIAGNOSIS — M1711 Unilateral primary osteoarthritis, right knee: Secondary | ICD-10-CM | POA: Diagnosis not present

## 2019-04-02 DIAGNOSIS — M1A9XX1 Chronic gout, unspecified, with tophus (tophi): Secondary | ICD-10-CM | POA: Insufficient documentation

## 2019-04-02 DIAGNOSIS — E1151 Type 2 diabetes mellitus with diabetic peripheral angiopathy without gangrene: Secondary | ICD-10-CM | POA: Insufficient documentation

## 2019-04-02 DIAGNOSIS — I69328 Other speech and language deficits following cerebral infarction: Secondary | ICD-10-CM | POA: Insufficient documentation

## 2019-04-02 DIAGNOSIS — Z86718 Personal history of other venous thrombosis and embolism: Secondary | ICD-10-CM | POA: Insufficient documentation

## 2019-04-02 DIAGNOSIS — I4891 Unspecified atrial fibrillation: Secondary | ICD-10-CM | POA: Insufficient documentation

## 2019-04-02 DIAGNOSIS — F17200 Nicotine dependence, unspecified, uncomplicated: Secondary | ICD-10-CM | POA: Diagnosis not present

## 2019-04-02 HISTORY — PX: OLECRANON BURSECTOMY: SHX2097

## 2019-04-02 LAB — GLUCOSE, CAPILLARY
Glucose-Capillary: 103 mg/dL — ABNORMAL HIGH (ref 70–99)
Glucose-Capillary: 117 mg/dL — ABNORMAL HIGH (ref 70–99)
Glucose-Capillary: 82 mg/dL (ref 70–99)

## 2019-04-02 SURGERY — BURSECTOMY, ELBOW
Anesthesia: General | Site: Elbow | Laterality: Left

## 2019-04-02 MED ORDER — GABAPENTIN 400 MG PO CAPS: 400.0000 mg | ORAL_CAPSULE | Freq: Three times a day (TID) | ORAL | 3 refills | Status: DC

## 2019-04-02 MED ORDER — HYDROCODONE-ACETAMINOPHEN 5-325 MG PO TABS: 1.0000 | ORAL_TABLET | Freq: Four times a day (QID) | ORAL | 0 refills | Status: DC | PRN

## 2019-04-02 MED ORDER — PROPOFOL 10 MG/ML IV BOLUS
INTRAVENOUS | Status: AC
  Filled 2019-04-02: qty 40

## 2019-04-02 MED ORDER — MELOXICAM 15 MG PO TABS: 15.0000 mg | ORAL_TABLET | Freq: Every day | ORAL | 3 refills | Status: DC

## 2019-04-02 MED ORDER — LIDOCAINE HCL (PF) 2 % IJ SOLN
INTRAMUSCULAR | Status: AC
  Filled 2019-04-02: qty 10

## 2019-04-02 MED ORDER — METOPROLOL TARTRATE 25 MG PO TABS
25.0000 mg | ORAL_TABLET | Freq: Once | ORAL | Status: AC
  Administered 2019-04-02: 07:00:00 25 mg via ORAL

## 2019-04-02 MED ORDER — ACETAMINOPHEN NICU IV SYRINGE 10 MG/ML
INTRAVENOUS | Status: AC
  Filled 2019-04-02: qty 1

## 2019-04-02 MED ORDER — ONDANSETRON HCL 4 MG/2ML IJ SOLN: 4.0000 mg | Freq: Once | INTRAMUSCULAR | Status: DC | PRN

## 2019-04-02 MED ORDER — METOPROLOL TARTRATE 25 MG PO TABS
ORAL_TABLET | ORAL | Status: AC
  Filled 2019-04-02: qty 1

## 2019-04-02 MED ORDER — NEOMYCIN-POLYMYXIN B GU 40-200000 IR SOLN
Status: AC
  Filled 2019-04-02: qty 4

## 2019-04-02 MED ORDER — LIDOCAINE HCL (PF) 1 % IJ SOLN
INTRAMUSCULAR | Status: AC
  Filled 2019-04-02: qty 30

## 2019-04-02 MED ORDER — FAMOTIDINE 20 MG PO TABS
ORAL_TABLET | ORAL | Status: AC
  Administered 2019-04-02: 07:00:00 20 mg via ORAL
  Filled 2019-04-02: qty 1

## 2019-04-02 MED ORDER — HYDRALAZINE HCL 20 MG/ML IJ SOLN
10.0000 mg | Freq: Once | INTRAMUSCULAR | Status: AC
  Administered 2019-04-02: 10:00:00 10 mg via INTRAVENOUS

## 2019-04-02 MED ORDER — BUPIVACAINE HCL (PF) 0.5 % IJ SOLN
INTRAMUSCULAR | Status: AC
  Filled 2019-04-02: qty 30

## 2019-04-02 MED ORDER — EPHEDRINE SULFATE 50 MG/ML IJ SOLN
INTRAMUSCULAR | Status: AC
  Filled 2019-04-02: qty 1

## 2019-04-02 MED ORDER — ONDANSETRON HCL 4 MG/2ML IJ SOLN
INTRAMUSCULAR | Status: AC
  Filled 2019-04-02: qty 2

## 2019-04-02 MED ORDER — FENTANYL CITRATE (PF) 100 MCG/2ML IJ SOLN
INTRAMUSCULAR | Status: DC | PRN
  Administered 2019-04-02 (×4): 25 ug via INTRAVENOUS

## 2019-04-02 MED ORDER — HYDRALAZINE HCL 20 MG/ML IJ SOLN
INTRAMUSCULAR | Status: AC
  Administered 2019-04-02: 10:00:00 5 mg via INTRAVENOUS
  Filled 2019-04-02: qty 1

## 2019-04-02 MED ORDER — CHLORHEXIDINE GLUCONATE CLOTH 2 % EX PADS: 6.0000 | MEDICATED_PAD | Freq: Once | CUTANEOUS | Status: DC

## 2019-04-02 MED ORDER — SUCCINYLCHOLINE CHLORIDE 20 MG/ML IJ SOLN
INTRAMUSCULAR | Status: DC | PRN
  Administered 2019-04-02: 100 mg via INTRAVENOUS

## 2019-04-02 MED ORDER — ONDANSETRON HCL 4 MG/2ML IJ SOLN
INTRAMUSCULAR | Status: DC | PRN
  Administered 2019-04-02: 4 mg via INTRAVENOUS

## 2019-04-02 MED ORDER — ACETAMINOPHEN 10 MG/ML IV SOLN
INTRAVENOUS | Status: DC | PRN
  Administered 2019-04-02: 1000 mg via INTRAVENOUS

## 2019-04-02 MED ORDER — SODIUM CHLORIDE 0.9 % IV SOLN
INTRAVENOUS | Status: DC
  Administered 2019-04-02: 07:00:00 via INTRAVENOUS

## 2019-04-02 MED ORDER — LIDOCAINE HCL (CARDIAC) PF 100 MG/5ML IV SOSY
PREFILLED_SYRINGE | INTRAVENOUS | Status: DC | PRN
  Administered 2019-04-02: 100 mg via INTRAVENOUS

## 2019-04-02 MED ORDER — SUCCINYLCHOLINE CHLORIDE 20 MG/ML IJ SOLN
INTRAMUSCULAR | Status: AC
  Filled 2019-04-02: qty 1

## 2019-04-02 MED ORDER — MIDAZOLAM HCL 2 MG/2ML IJ SOLN
INTRAMUSCULAR | Status: AC
  Filled 2019-04-02: qty 2

## 2019-04-02 MED ORDER — GABAPENTIN 300 MG PO CAPS
300.0000 mg | ORAL_CAPSULE | ORAL | Status: AC
  Administered 2019-04-02: 07:00:00 300 mg via ORAL

## 2019-04-02 MED ORDER — CEFAZOLIN SODIUM-DEXTROSE 2-4 GM/100ML-% IV SOLN
2.0000 g | INTRAVENOUS | Status: AC
  Administered 2019-04-02: 2 g via INTRAVENOUS

## 2019-04-02 MED ORDER — FAMOTIDINE 20 MG PO TABS
20.0000 mg | ORAL_TABLET | Freq: Once | ORAL | Status: AC
  Administered 2019-04-02: 07:00:00 20 mg via ORAL

## 2019-04-02 MED ORDER — FENTANYL CITRATE (PF) 100 MCG/2ML IJ SOLN
INTRAMUSCULAR | Status: AC
  Filled 2019-04-02: qty 2

## 2019-04-02 MED ORDER — PHENYLEPHRINE HCL (PRESSORS) 10 MG/ML IV SOLN
INTRAVENOUS | Status: DC | PRN
  Administered 2019-04-02 (×2): 50 ug via INTRAVENOUS

## 2019-04-02 MED ORDER — CEFAZOLIN SODIUM-DEXTROSE 2-4 GM/100ML-% IV SOLN
INTRAVENOUS | Status: AC
  Filled 2019-04-02: qty 100

## 2019-04-02 MED ORDER — PROPOFOL 10 MG/ML IV BOLUS
INTRAVENOUS | Status: DC | PRN
  Administered 2019-04-02: 120 mg via INTRAVENOUS
  Administered 2019-04-02 (×2): 40 mg via INTRAVENOUS

## 2019-04-02 MED ORDER — LACTATED RINGERS IV SOLN: INTRAVENOUS | Status: DC | PRN

## 2019-04-02 MED ORDER — EPHEDRINE SULFATE 50 MG/ML IJ SOLN
INTRAMUSCULAR | Status: DC | PRN
  Administered 2019-04-02: 5 mg via INTRAVENOUS
  Administered 2019-04-02: 10 mg via INTRAVENOUS
  Administered 2019-04-02: 5 mg via INTRAVENOUS
  Administered 2019-04-02 (×3): 10 mg via INTRAVENOUS

## 2019-04-02 MED ORDER — HYDRALAZINE HCL 20 MG/ML IJ SOLN
5.0000 mg | Freq: Once | INTRAMUSCULAR | Status: AC
  Administered 2019-04-02: 10:00:00 5 mg via INTRAVENOUS

## 2019-04-02 MED ORDER — CLINDAMYCIN PHOSPHATE 600 MG/50ML IV SOLN
INTRAVENOUS | Status: AC
  Filled 2019-04-02: qty 50

## 2019-04-02 MED ORDER — BUPIVACAINE HCL (PF) 0.5 % IJ SOLN
INTRAMUSCULAR | Status: DC | PRN
  Administered 2019-04-02: 26 mL

## 2019-04-02 MED ORDER — PHENYLEPHRINE HCL (PRESSORS) 10 MG/ML IV SOLN
INTRAVENOUS | Status: AC
  Filled 2019-04-02: qty 1

## 2019-04-02 MED ORDER — FENTANYL CITRATE (PF) 100 MCG/2ML IJ SOLN: 25.0000 ug | INTRAMUSCULAR | Status: DC | PRN

## 2019-04-02 MED ORDER — GABAPENTIN 300 MG PO CAPS
ORAL_CAPSULE | ORAL | Status: AC
  Administered 2019-04-02: 07:00:00 300 mg via ORAL
  Filled 2019-04-02: qty 1

## 2019-04-02 MED ORDER — NEOMYCIN-POLYMYXIN B GU 40-200000 IR SOLN
Status: DC | PRN
  Administered 2019-04-02: 4 mL

## 2019-04-02 SURGICAL SUPPLY — 44 items
BLADE SURG 15 STRL LF DISP TIS (BLADE) ×1 IMPLANT
BLADE SURG 15 STRL SS (BLADE) ×2
BLADE SURG SZ10 CARB STEEL (BLADE) ×3 IMPLANT
BNDG COHESIVE 4X5 TAN STRL (GAUZE/BANDAGES/DRESSINGS) ×3 IMPLANT
BNDG ESMARK 4X12 TAN STRL LF (GAUZE/BANDAGES/DRESSINGS) ×3 IMPLANT
CANISTER SUCT 1200ML W/VALVE (MISCELLANEOUS) ×3 IMPLANT
CHLORAPREP W/TINT 26 (MISCELLANEOUS) ×3 IMPLANT
COVER WAND RF STERILE (DRAPES) ×3 IMPLANT
CUFF TOURN SGL QUICK 18X4 (TOURNIQUET CUFF) IMPLANT
CUFF TOURN SGL QUICK 24 (TOURNIQUET CUFF) ×2
CUFF TRNQT CYL 24X4X16.5-23 (TOURNIQUET CUFF) ×1 IMPLANT
ELECT REM PT RETURN 9FT ADLT (ELECTROSURGICAL) ×3
ELECTRODE REM PT RTRN 9FT ADLT (ELECTROSURGICAL) ×1 IMPLANT
GLOVE BIO SURGEON STRL SZ8 (GLOVE) ×3 IMPLANT
GLOVE SURG ORTHO 8.5 STRL (GLOVE) ×3 IMPLANT
GOWN STRL REUS W/ TWL LRG LVL3 (GOWN DISPOSABLE) ×1 IMPLANT
GOWN STRL REUS W/TWL LRG LVL3 (GOWN DISPOSABLE) ×2
GOWN STRL REUS W/TWL LRG LVL4 (GOWN DISPOSABLE) ×3 IMPLANT
KIT TURNOVER KIT A (KITS) ×3 IMPLANT
LABEL OR SOLS (LABEL) ×3 IMPLANT
LOOP VESSEL SUPERMAXI WHITE (MISCELLANEOUS) ×3 IMPLANT
NDL SAFETY ECLIPSE 18X1.5 (NEEDLE) ×1 IMPLANT
NEEDLE FILTER BLUNT 18X 1/2SAF (NEEDLE) ×2
NEEDLE FILTER BLUNT 18X1 1/2 (NEEDLE) ×1 IMPLANT
NEEDLE HYPO 18GX1.5 SHARP (NEEDLE) ×2
NS IRRIG 1000ML POUR BTL (IV SOLUTION) ×3 IMPLANT
PACK EXTREMITY ARMC (MISCELLANEOUS) ×3 IMPLANT
PADDING CAST 4IN STRL (MISCELLANEOUS) ×4
PADDING CAST BLEND 4X4 STRL (MISCELLANEOUS) ×2 IMPLANT
SPLINT CAST 1 STEP 3X12 (MISCELLANEOUS) ×3 IMPLANT
SPLINT CAST 1 STEP 4X30 (MISCELLANEOUS) ×3 IMPLANT
SPONGE LAP 18X18 RF (DISPOSABLE) ×3 IMPLANT
STAPLER SKIN PROX 35W (STAPLE) ×3 IMPLANT
STOCKINETTE BIAS CUT 4 980044 (GAUZE/BANDAGES/DRESSINGS) ×3 IMPLANT
STOCKINETTE BIAS CUT 6 980064 (GAUZE/BANDAGES/DRESSINGS) ×3 IMPLANT
STOCKINETTE IMPERVIOUS 9X36 MD (GAUZE/BANDAGES/DRESSINGS) ×3 IMPLANT
SUT ETHILON 4-0 (SUTURE) ×2
SUT ETHILON 4-0 FS2 18XMFL BLK (SUTURE) ×1
SUT VIC AB 2-0 CT1 36 (SUTURE) ×3 IMPLANT
SUT VIC AB 4-0 SH 27 (SUTURE) ×2
SUT VIC AB 4-0 SH 27XANBCTRL (SUTURE) ×1 IMPLANT
SUT VICRYL 3-0 27IN (SUTURE) ×3 IMPLANT
SUTURE ETHLN 4-0 FS2 18XMF BLK (SUTURE) ×1 IMPLANT
SYR 10ML LL (SYRINGE) ×3 IMPLANT

## 2019-04-02 NOTE — Anesthesia Procedure Notes (Signed)
Procedure Name: Intubation Date/Time: 04/02/2019 7:59 AM Performed by: Debe Coder, CRNA Pre-anesthesia Checklist: Patient identified, Emergency Drugs available, Suction available and Patient being monitored Patient Re-evaluated:Patient Re-evaluated prior to induction Oxygen Delivery Method: Circle system utilized Preoxygenation: Pre-oxygenation with 100% oxygen Induction Type: IV induction Ventilation: Mask ventilation without difficulty Laryngoscope Size: Mac and 4 Grade View: Grade I Tube type: Oral Tube size: 7.5 mm Number of attempts: 1 Airway Equipment and Method: Stylet and Oral airway (Anterior laryngeal pressure) Placement Confirmation: ETT inserted through vocal cords under direct vision,  positive ETCO2 and breath sounds checked- equal and bilateral Secured at: 22 cm Tube secured with: Tape Dental Injury: Teeth and Oropharynx as per pre-operative assessment

## 2019-04-02 NOTE — Anesthesia Post-op Follow-up Note (Signed)
Anesthesia QCDR form completed.        

## 2019-04-02 NOTE — Discharge Instructions (Signed)

## 2019-04-02 NOTE — H&P (Signed)
THE PATIENT WAS SEEN PRIOR TO SURGERY TODAY.  HISTORY, ALLERGIES, HOME MEDICATIONS AND OPERATIVE PROCEDURE WERE REVIEWED. RISKS AND BENEFITS OF SURGERY DISCUSSED WITH PATIENT AGAIN.  NO CHANGES FROM INITIAL HISTORY AND PHYSICAL NOTED.    

## 2019-04-02 NOTE — Progress Notes (Signed)
Pt BP: 166/102. Dr. Kayleen Memos notified. Acknowledged. Orders received.

## 2019-04-02 NOTE — Op Note (Signed)
04/02/2019  9:09 AM  PATIENT:  [name]  67 y.o. male  PRE-OPERATIVE DIAGNOSIS:  [id number] chronic gout, unspecified, with tophus left elbow  POST-OPERATIVE DIAGNOSIS:  Same  PROCEDURE:  OLECRANON BURSA AND TOPHUS EXCISION LEFT ELBOW  SURGEON:  Vearl Aitken E Kella Splinter MD  ASST:    ANESTHESIA:   General  EBL: None  TOURNIQUET TIME: 38 minutes  OPERATIVE FINDINGS: Large gouty tophus left elbow  OPERATIVE PROCEDURE: The patient was brought to the operating room and underwent satisfactory general endotracheal anesthesia in the supine position on the operating room table.  The left arm was prepped and draped in sterile fashion and Esmarch applied.  Tourniquet was inflated to 250 mmHg.  A posterior incision was made over the proximal ulna and olecranon area.  Dissection was carried out bluntly and sharply around the large tophus until it was completely excised.  Rondure was used to remain as few small fragments of gouty tissue.  The wound was irrigated irrigated.  Subcutaneous tissue was closed with 4-0 Vicryl over a vessel loop drain and the skin was closed with staples.  Half percent Marcaine was placed in the tissues.  A dry sterile dressing and posterior splint were applied.  Tourniquet was deflated with good return of blood flow to the hand.  Patient was awakened and taken recovery room in good condition.  Mark Leys, MD

## 2019-04-02 NOTE — Transfer of Care (Signed)
Immediate Anesthesia Transfer of Care Note  Patient: [name]  Procedure(s) Performed: OLECRANON BURSA (Left Elbow)  Patient Location: PACU  Anesthesia Type:General  Level of Consciousness: awake, oriented, drowsy and patient cooperative  Airway & Oxygen Therapy: Patient Spontanous Breathing and Patient connected to face mask oxygen  Post-op Assessment: Report given to RN and Post -op Vital signs reviewed and stable  Post vital signs: Reviewed and stable  Last Vitals:  Vitals Value Taken Time  BP 167/107 04/02/19 0919  Temp    Pulse 69 04/02/19 0926  Resp 10 04/02/19 0926  SpO2 100 % 04/02/19 0926  Vitals shown include unvalidated device data.  Last Pain:  Vitals:   04/02/19 0919  TempSrc:   PainSc: (P) Asleep         Complications: No apparent anesthesia complications

## 2019-04-02 NOTE — Progress Notes (Signed)
Pt BP: 155/106: Dr. Kayleen Memos at bedside. Orders received.

## 2019-04-02 NOTE — Anesthesia Postprocedure Evaluation (Signed)
Anesthesia Post Note  Patient: [name]  Procedure(s) Performed: OLECRANON BURSA (Left Elbow)  Patient location during evaluation: PACU Anesthesia Type: General Level of consciousness: awake and alert and oriented Pain management: pain level controlled Vital Signs Assessment: post-procedure vital signs reviewed and stable Respiratory status: spontaneous breathing Cardiovascular status: blood pressure returned to baseline Anesthetic complications: no     Last Vitals:  Vitals:   04/02/19 1021 04/02/19 1054  BP: (!) 147/87 140/81  Pulse: 79 60  Resp: 18   Temp: (!) 35.7 C   SpO2: 100%     Last Pain:  Vitals:   04/02/19 1021  TempSrc: Tympanic  PainSc: 0-No pain                 Krystale Rinkenberger

## 2019-04-02 NOTE — Anesthesia Preprocedure Evaluation (Signed)
Anesthesia Evaluation  Patient identified by MRN, date of birth, ID band Patient awake    Reviewed: Allergy & Precautions, NPO status , Patient's Chart, lab work & pertinent test results  Airway Mallampati: II  TM Distance: >3 FB     Dental  (+) Edentulous Upper, Edentulous Lower   Pulmonary Current Smoker, PE   Pulmonary exam normal        Cardiovascular hypertension, + Peripheral Vascular Disease and + DVT  Normal cardiovascular exam+ dysrhythmias Atrial Fibrillation      Neuro/Psych CVA, Residual Symptoms negative psych ROS   GI/Hepatic negative GI ROS, (+)     substance abuse  cocaine use,   Endo/Other  diabetes  Renal/GU negative Renal ROS     Musculoskeletal  (+) Arthritis , Osteoarthritis,    Abdominal Normal abdominal exam  (+)   Peds  Hematology  (+) anemia ,   Anesthesia Other Findings Past Medical History: No date: Arthritis No date: Atrial fibrillation (HCC) No date: Cocaine abuse ([place])     Comment:  + UDS on admission No date: Diabetes mellitus without complication ([place]) current and history of: Gout 2006: History of deep vein thrombosis No date: History of tobacco abuse     Comment:  has smoked for 50 years No date: Hypertension 2010: Pulmonary embolism ([place]) 2017: Stroke ([place])     Comment:  affected speech and right side-uses cane  Reproductive/Obstetrics                             Anesthesia Physical Anesthesia Plan  ASA: III  Anesthesia Plan: General   Post-op Pain Management:    Induction: Intravenous  PONV Risk Score and Plan:   Airway Management Planned: Oral ETT  Additional Equipment:   Intra-op Plan:   Post-operative Plan:   Informed Consent: I have reviewed the patients History and Physical, chart, labs and discussed the procedure including the risks, benefits and alternatives for the proposed anesthesia with the patient or authorized  representative who has indicated his/her understanding and acceptance.     Dental advisory given  Plan Discussed with: CRNA and Surgeon  Anesthesia Plan Comments:         Anesthesia Quick Evaluation

## 2019-04-03 ENCOUNTER — Encounter: Payer: Self-pay | Admitting: Specialist

## 2019-04-04 LAB — SURGICAL PATHOLOGY

## 2019-04-07 ENCOUNTER — Other Ambulatory Visit: Payer: Self-pay

## 2019-04-07 ENCOUNTER — Inpatient Hospital Stay
Admission: EM | Admit: 2019-04-07 | Discharge: 2019-04-11 | DRG: 698 | Disposition: A | Discharge status: Home / Self Care | Payer: Medicare Other | Attending: Internal Medicine | Admitting: Internal Medicine

## 2019-04-07 ENCOUNTER — Encounter: Payer: Self-pay | Admitting: Radiology

## 2019-04-07 ENCOUNTER — Emergency Department: Payer: Medicare Other

## 2019-04-07 DIAGNOSIS — R1032 Left lower quadrant pain: Secondary | ICD-10-CM

## 2019-04-07 DIAGNOSIS — Z833 Family history of diabetes mellitus: Secondary | ICD-10-CM

## 2019-04-07 DIAGNOSIS — J209 Acute bronchitis, unspecified: Secondary | ICD-10-CM | POA: Diagnosis not present

## 2019-04-07 DIAGNOSIS — M109 Gout, unspecified: Secondary | ICD-10-CM | POA: Diagnosis present

## 2019-04-07 DIAGNOSIS — M1711 Unilateral primary osteoarthritis, right knee: Secondary | ICD-10-CM | POA: Diagnosis present

## 2019-04-07 DIAGNOSIS — F17210 Nicotine dependence, cigarettes, uncomplicated: Secondary | ICD-10-CM | POA: Diagnosis present

## 2019-04-07 DIAGNOSIS — I2699 Other pulmonary embolism without acute cor pulmonale: Secondary | ICD-10-CM | POA: Diagnosis present

## 2019-04-07 DIAGNOSIS — R509 Fever, unspecified: Secondary | ICD-10-CM

## 2019-04-07 DIAGNOSIS — I70203 Unspecified atherosclerosis of native arteries of extremities, bilateral legs: Secondary | ICD-10-CM | POA: Diagnosis present

## 2019-04-07 DIAGNOSIS — E1142 Type 2 diabetes mellitus with diabetic polyneuropathy: Secondary | ICD-10-CM

## 2019-04-07 DIAGNOSIS — Z86711 Personal history of pulmonary embolism: Secondary | ICD-10-CM

## 2019-04-07 DIAGNOSIS — Z7984 Long term (current) use of oral hypoglycemic drugs: Secondary | ICD-10-CM | POA: Diagnosis not present

## 2019-04-07 DIAGNOSIS — E1151 Type 2 diabetes mellitus with diabetic peripheral angiopathy without gangrene: Secondary | ICD-10-CM | POA: Diagnosis present

## 2019-04-07 DIAGNOSIS — Z8701 Personal history of pneumonia (recurrent): Secondary | ICD-10-CM

## 2019-04-07 DIAGNOSIS — Z7901 Long term (current) use of anticoagulants: Secondary | ICD-10-CM | POA: Diagnosis not present

## 2019-04-07 DIAGNOSIS — I69328 Other speech and language deficits following cerebral infarction: Secondary | ICD-10-CM

## 2019-04-07 DIAGNOSIS — Z20828 Contact with and (suspected) exposure to other viral communicable diseases: Secondary | ICD-10-CM | POA: Diagnosis present

## 2019-04-07 DIAGNOSIS — A419 Sepsis, unspecified organism: Secondary | ICD-10-CM | POA: Diagnosis not present

## 2019-04-07 DIAGNOSIS — I69322 Dysarthria following cerebral infarction: Secondary | ICD-10-CM

## 2019-04-07 DIAGNOSIS — N280 Ischemia and infarction of kidney: Secondary | ICD-10-CM | POA: Diagnosis not present

## 2019-04-07 DIAGNOSIS — Z79899 Other long term (current) drug therapy: Secondary | ICD-10-CM

## 2019-04-07 DIAGNOSIS — I482 Chronic atrial fibrillation, unspecified: Secondary | ICD-10-CM | POA: Diagnosis present

## 2019-04-07 DIAGNOSIS — Z95828 Presence of other vascular implants and grafts: Secondary | ICD-10-CM

## 2019-04-07 DIAGNOSIS — I69398 Other sequelae of cerebral infarction: Secondary | ICD-10-CM | POA: Diagnosis not present

## 2019-04-07 DIAGNOSIS — I10 Essential (primary) hypertension: Secondary | ICD-10-CM | POA: Diagnosis not present

## 2019-04-07 DIAGNOSIS — Z791 Long term (current) use of non-steroidal anti-inflammatories (NSAID): Secondary | ICD-10-CM

## 2019-04-07 DIAGNOSIS — E1140 Type 2 diabetes mellitus with diabetic neuropathy, unspecified: Secondary | ICD-10-CM | POA: Diagnosis present

## 2019-04-07 DIAGNOSIS — I825Y3 Chronic embolism and thrombosis of unspecified deep veins of proximal lower extremity, bilateral: Secondary | ICD-10-CM | POA: Diagnosis not present

## 2019-04-07 DIAGNOSIS — Z86718 Personal history of other venous thrombosis and embolism: Secondary | ICD-10-CM | POA: Diagnosis not present

## 2019-04-07 DIAGNOSIS — R109 Unspecified abdominal pain: Secondary | ICD-10-CM | POA: Diagnosis present

## 2019-04-07 DIAGNOSIS — M79662 Pain in left lower leg: Secondary | ICD-10-CM | POA: Diagnosis not present

## 2019-04-07 DIAGNOSIS — E119 Type 2 diabetes mellitus without complications: Secondary | ICD-10-CM | POA: Diagnosis not present

## 2019-04-07 DIAGNOSIS — I82409 Acute embolism and thrombosis of unspecified deep veins of unspecified lower extremity: Secondary | ICD-10-CM | POA: Diagnosis not present

## 2019-04-07 LAB — URINE DRUG SCREEN, QUALITATIVE (ARMC ONLY)
Amphetamines, Ur Screen: NOT DETECTED
Barbiturates, Ur Screen: NOT DETECTED
Benzodiazepine, Ur Scrn: NOT DETECTED
Cannabinoid 50 Ng, Ur ~~LOC~~: NOT DETECTED
Cocaine Metabolite,Ur ~~LOC~~: NOT DETECTED
MDMA (Ecstasy)Ur Screen: NOT DETECTED
Methadone Scn, Ur: NOT DETECTED
Opiate, Ur Screen: POSITIVE — AB
Phencyclidine (PCP) Ur S: NOT DETECTED
Tricyclic, Ur Screen: NOT DETECTED

## 2019-04-07 LAB — COMPREHENSIVE METABOLIC PANEL
ALT: 14 U/L (ref 0–44)
AST: 23 U/L (ref 15–41)
Albumin: 4.2 g/dL (ref 3.5–5.0)
Alkaline Phosphatase: 66 U/L (ref 38–126)
Anion gap: 14 (ref 5–15)
BUN: 22 mg/dL (ref 8–23)
CO2: 28 mmol/L (ref 22–32)
Calcium: 9.4 mg/dL (ref 8.9–10.3)
Chloride: 96 mmol/L — ABNORMAL LOW (ref 98–111)
Creatinine, Ser: 1.07 mg/dL (ref 0.61–1.24)
GFR calc Af Amer: >60 mL/min (ref >60)
GFR calc non Af Amer: >60 mL/min (ref >60)
Glucose, Bld: 168 mg/dL — ABNORMAL HIGH (ref 70–99)
Potassium: 3.6 mmol/L (ref 3.5–5.1)
Sodium: 138 mmol/L (ref 135–145)
Total Bilirubin: 0.8 mg/dL (ref 0.3–1.2)
Total Protein: 8.7 g/dL — ABNORMAL HIGH (ref 6.5–8.1)

## 2019-04-07 LAB — URINALYSIS, COMPLETE (UACMP) WITH MICROSCOPIC
Bacteria, UA: NONE SEEN
Bilirubin Urine: NEGATIVE
Glucose, UA: NEGATIVE mg/dL
Hgb urine dipstick: NEGATIVE
Ketones, ur: NEGATIVE mg/dL
Leukocytes,Ua: NEGATIVE
Nitrite: NEGATIVE
Protein, ur: 100 mg/dL — AB
Specific Gravity, Urine: 1.035 — ABNORMAL HIGH (ref 1.005–1.030)
pH: 6 (ref 5.0–8.0)

## 2019-04-07 LAB — SARS CORONAVIRUS 2 (TAT 6-24 HRS): SARS Coronavirus 2: NEGATIVE

## 2019-04-07 LAB — APTT
aPTT: 27 seconds (ref 24–36)
aPTT: 61 seconds — ABNORMAL HIGH (ref 24–36)

## 2019-04-07 LAB — HEPARIN LEVEL (UNFRACTIONATED): Heparin Unfractionated: 1.32 IU/mL — ABNORMAL HIGH (ref 0.30–0.70)

## 2019-04-07 LAB — HEMOGLOBIN A1C
Hgb A1c MFr Bld: 6.8 % — ABNORMAL HIGH (ref 4.8–5.6)
Mean Plasma Glucose: 148.46 mg/dL

## 2019-04-07 LAB — LIPASE, BLOOD: Lipase: 22 U/L (ref 11–51)

## 2019-04-07 LAB — CBC
HCT: 41.2 % (ref 39.0–52.0)
Hemoglobin: 13.2 g/dL (ref 13.0–17.0)
MCH: 25.2 pg — ABNORMAL LOW (ref 26.0–34.0)
MCHC: 32 g/dL (ref 30.0–36.0)
MCV: 78.6 fL — ABNORMAL LOW (ref 80.0–100.0)
Platelets: 248 10*3/uL (ref 150–400)
RBC: 5.24 MIL/uL (ref 4.22–5.81)
RDW: 16 % — ABNORMAL HIGH (ref 11.5–15.5)
WBC: 4.5 10*3/uL (ref 4.0–10.5)
nRBC: 0 % (ref 0.0–0.2)

## 2019-04-07 LAB — MAGNESIUM: Magnesium: 1.9 mg/dL (ref 1.7–2.4)

## 2019-04-07 LAB — PROTIME-INR
INR: 1.1 (ref 0.8–1.2)
Prothrombin Time: 13.9 seconds (ref 11.4–15.2)

## 2019-04-07 LAB — TSH: TSH: 0.487 u[IU]/mL (ref 0.350–4.500)

## 2019-04-07 LAB — PHOSPHORUS: Phosphorus: 3.3 mg/dL (ref 2.5–4.6)

## 2019-04-07 MED ORDER — ATORVASTATIN CALCIUM 20 MG PO TABS
80.0000 mg | ORAL_TABLET | Freq: Every day | ORAL | Status: DC
  Administered 2019-04-07: 80 mg via ORAL
  Filled 2019-04-07: qty 4
  Filled 2019-04-07: qty 1

## 2019-04-07 MED ORDER — HEPARIN BOLUS VIA INFUSION
2000.0000 [IU] | Freq: Once | INTRAVENOUS | Status: AC
  Administered 2019-04-07: 2000 [IU] via INTRAVENOUS
  Filled 2019-04-07: qty 2000

## 2019-04-07 MED ORDER — ONDANSETRON HCL 4 MG/2ML IJ SOLN
4.0000 mg | Freq: Once | INTRAMUSCULAR | Status: AC
  Administered 2019-04-07: 4 mg via INTRAVENOUS
  Filled 2019-04-07: qty 2

## 2019-04-07 MED ORDER — MORPHINE SULFATE (PF) 4 MG/ML IV SOLN
4.0000 mg | Freq: Once | INTRAVENOUS | Status: AC
  Administered 2019-04-07: 4 mg via INTRAVENOUS
  Filled 2019-04-07: qty 1

## 2019-04-07 MED ORDER — IOHEXOL 300 MG/ML  SOLN
100.0000 mL | Freq: Once | INTRAMUSCULAR | Status: AC | PRN
  Administered 2019-04-07: 100 mL via INTRAVENOUS

## 2019-04-07 MED ORDER — METOPROLOL TARTRATE 25 MG PO TABS
25.0000 mg | ORAL_TABLET | Freq: Two times a day (BID) | ORAL | Status: DC
  Administered 2019-04-07 – 2019-04-11 (×9): 25 mg via ORAL
  Filled 2019-04-07: qty 1
  Filled 2019-04-07: qty 0.5
  Filled 2019-04-07 (×8): qty 1

## 2019-04-07 MED ORDER — HEPARIN BOLUS VIA INFUSION
4000.0000 [IU] | Freq: Once | INTRAVENOUS | Status: DC
  Filled 2019-04-07: qty 4000

## 2019-04-07 MED ORDER — HEPARIN (PORCINE) 25000 UT/250ML-% IV SOLN
1850.0000 [IU]/h | INTRAVENOUS | Status: DC
  Administered 2019-04-07: 1600 [IU]/h via INTRAVENOUS
  Administered 2019-04-08: 1800 [IU]/h via INTRAVENOUS
  Administered 2019-04-09 – 2019-04-10 (×3): 1850 [IU]/h via INTRAVENOUS
  Filled 2019-04-07 (×7): qty 250

## 2019-04-07 MED ORDER — SODIUM CHLORIDE 0.9 % IV BOLUS
500.0000 mL | Freq: Once | INTRAVENOUS | Status: AC
  Administered 2019-04-07: 500 mL via INTRAVENOUS

## 2019-04-07 MED ORDER — IOHEXOL 9 MG/ML PO SOLN
500.0000 mL | Freq: Two times a day (BID) | ORAL | Status: DC | PRN
  Administered 2019-04-07 (×2): 500 mL via ORAL
  Filled 2019-04-07 (×3): qty 500

## 2019-04-07 MED ORDER — HYDRALAZINE HCL 20 MG/ML IJ SOLN
5.0000 mg | Freq: Four times a day (QID) | INTRAMUSCULAR | Status: DC | PRN
  Administered 2019-04-07: 5 mg via INTRAVENOUS
  Filled 2019-04-07: qty 0.25
  Filled 2019-04-07: qty 1

## 2019-04-07 NOTE — ED Notes (Signed)
Pt taken to CT via stretcher.

## 2019-04-07 NOTE — Consult Note (Signed)
ANTICOAGULATION CONSULT NOTE   Pharmacy Consult for Heparin Indication: DVT  No Known Allergies  Patient Measurements: Height: 6\' 2"  (188 cm) Weight: 250 lb (113.4 kg) IBW/kg (Calculated) : 82.2 Heparin Dosing Weight: 105.9 kg  Vital Signs: BP: 143/85 (11/21 1713) Pulse Rate: 73 (11/21 1713)  Labs: Recent Labs    04/07/19 0529 04/07/19 1134 04/07/19 1452 04/07/19 2033  HGB 13.2  --   --   --   HCT 41.2  --   --   --   PLT 248  --   --   --   APTT  --  27  --  61*  LABPROT  --  13.9  --   --   INR  --  1.1  --   --   HEPARINUNFRC  --   --  1.32*  --   CREATININE 1.07  --   --   --     Estimated Creatinine Clearance: 86 mL/min (by C-G formula based on SCr of 1.07 mg/dL).   Medical History: Past Medical History:  Diagnosis Date  . Arthritis   . Atrial fibrillation ([place])   . Cocaine abuse ([place])    + UDS on admission  . Diabetes mellitus without complication ([place])   . Gout current and history of  . History of deep vein thrombosis 2006  . History of tobacco abuse    has smoked for 50 years  . Hypertension   . Pulmonary embolism ([place]) 2010  . Stroke [facility]) 2017   affected speech and right side-uses cane    Medications:  Apixaban on prior to admission medication list. Out-patient prescription appears expired since 11/10. Per RN last dose was yesterday.   Assessment: Patient is a 68 y/o M with medical history of DVT, PE, CVA, gout, diabetes, hypertension who presented to [facility] ED 11/21 c/o abdominal pain. He reports eating something bad and several episodes of non-black non-bloody emesis. 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 CT abdomen pelvis w contrast impression of left renal artery occlusion and bilateral deep vein calcification correlating with remote DVT with IVC filter in place. Pharmacy has been consulted for heparin drip for VTE treatment. Baseline aPTT and INR within normal limits. Hgb and plt within normal limits.    1121 @ 20:33 aPTT 61 (results available @2114 ).Subtherapeutic. Confirmed with nurse no heparin interruptions. Will follow aPTT until HL and aPTT levels correlate.   Goal of Therapy:  Heparin level 0.3-0.7 units/ml aPTT 66-102 seconds Monitor platelets by anticoagulation protocol: Yes   Plan:  -Heparin infusion: Increase rate to 1800 units/hr -aPTT in 6 hours and check HL with AM labs -Daily CBC per protocol  [place] Resident 04/07/2019,9:18 PM

## 2019-04-07 NOTE — ED Notes (Signed)
Pt ambulatory to toilet, informed of need for urine sample. Given cup to collect urine in. Denied wanting this RN to remain in room.

## 2019-04-07 NOTE — H&P (Signed)
History and Physical    [name] NTI:[id number] DOB: Oct 05, 1950 DOA: 04/07/2019  PCP: Theotis Burrow, MD (Confirm with patient/family/NH records and if not entered, this has to be entered at [facility] point of entry) Patient coming from: Home  I have personally briefly reviewed patient's old medical records in [place]  Chief Complaint: Home  HPI: [name] is a 68 y.o. male with medical history significant of atrial fibrillation, diabetes mellitus, hypertension seen in the emergency room for left-sided abdominal pain 10 out of 10 started last night while at rest at home.  Patient states the pain is new onset he has never had similar episode in the past.  He has not used any substances in the past 9 years.  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  His left arm is in a bandage he has recently had surgery olecranon bursectomy that was done on November 16 of this year.  Patient also reports that he had nausea and has had vomiting 3 times since yesterday because of this abdominal pain he denies any changes in bowel movements or bowel habits, patient denies any hematochezia or melena.  Patient also denies any dysuria or hematuria.  Patient also has a history of stent in his leg, patient has an IVC filter.  Family history is significant for aneurysm in the mother, diabetes in his father.  Home meds reviewed consist of Eliquis 5 mg twice daily, meloxicam, Metformin, metoprolol, tramadol.  ED Course:  Blood pressure (!) 174/102, pulse 72, temperature 98 F (36.7 C), temperature source Oral, resp. rate 17, height 6' 2"  (1.88 m), weight 113.4 kg, SpO2 96 %. Initial CBC shows hemoglobin of 13.2, hematocrit of 41, however MCV 78.6, RDW 16, platelet count of 248. Compressive metabolic panel shows a normal sodium of 138, potassium of 3.6, blood glucose of 168, serum creatinine of 1.07, EGFR of 60, AST of 23, ALT of 14.   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 No underlying dissection or definite embolic source identified. No evident area of infarction or ischemia in the other viscera. 2. Cardiomegaly 3.  Aortic Atherosclerosis (ICD10-I70.0). 4. Bilateral deep femoral vein calcification correlating with remote DVT. An IVC filter is in place. Review of Systems: As per HPI otherwise 10 point review of systems negative.   Past Medical History:  Diagnosis Date  . Arthritis   . Atrial fibrillation ([place])   . Cocaine abuse ([place])    + UDS on admission  . Diabetes mellitus without complication ([place])   . Gout current and history of  . History of deep vein thrombosis 2006  . History of tobacco abuse    has smoked for 50 years  . Hypertension   . Pulmonary embolism ([place]) 2010  . Stroke [facility]) 2017   affected speech and right side-uses cane    Past Surgical History:  Procedure Laterality Date  . COLONOSCOPY    . OLECRANON BURSECTOMY Left 04/02/2019   Procedure: OLECRANON BURSA;  Surgeon: Earnestine Leys, MD;  Location: [facility] ORS;  Service: Orthopedics;  Laterality: Left;  [name] VASCULAR SURGERY     Stent in leg     reports that he has been smoking cigarettes. He has a 25.00 pack-year smoking history. He has never used smokeless tobacco. He reports that he does not drink alcohol or use drugs.  No Known Allergies  Family History  Problem Relation Age of Onset  . Aneurysm Mother   . Diabetes Father     Prior to Admission medications   Medication Sig Start Date End Date Taking? Authorizing Provider  allopurinol (ZYLOPRIM) 100 MG tablet Take 100 mg by mouth 2 (two) times daily.  09/18/18   [provider]  apixaban (ELIQUIS) 5 MG TABS tablet Take 1 tablet (5 mg total) by mouth 2 (two) times daily for 30 days. 06/24/18 03/27/19  Saundra Shelling, MD  furosemide (LASIX) 40 MG tablet Take 1 tablet (40 mg total) by  mouth daily for 30 days. Patient taking differently: Take 40 mg by mouth 2 (two) times daily.  06/24/18 03/18/20  Saundra Shelling, MD  gabapentin (NEURONTIN) 400 MG capsule Take 1 capsule (400 mg total) by mouth 3 (three) times daily. 04/02/19   Earnestine Leys, MD  HYDROcodone-acetaminophen (NORCO) 5-325 MG tablet Take 1-2 tablets by mouth every 6 (six) hours as needed. 04/02/19   Earnestine Leys, MD  losartan (COZAAR) 50 MG tablet Take 50 mg by mouth every morning.  04/10/18 04/10/19  [provider]  meloxicam (MOBIC) 15 MG tablet Take 1 tablet (15 mg total) by mouth daily. 04/02/19   Earnestine Leys, MD  metFORMIN (GLUCOPHAGE) 500 MG tablet Take 500 mg by mouth 2 (two) times daily with a meal.  04/12/17   [provider]  metoprolol tartrate (LOPRESSOR) 25 MG tablet Take 25 mg by mouth 2 (two) times daily.    [provider]  potassium chloride (K-DUR) 10 MEQ tablet Take 10 mEq by mouth daily.  02/09/18   [provider]  simvastatin (ZOCOR) 20 MG tablet Take 20 mg by mouth at bedtime. 02/07/18   [provider]  traMADol (ULTRAM) 50 MG tablet Take 50 mg by mouth every 6 (six) hours. 02/27/19   [provider]    Physical Exam: Vitals:   04/07/19 1400 04/07/19 1415 04/07/19 1430 04/07/19 1445  BP: (!) 171/106  (!) 174/102   Pulse:   72   Resp: 14 (!) 21 15 17   Temp:      TempSrc:      SpO2: 95%  96%   Weight:      Height:        Constitutional: NAD, calm, comfortable Vitals:   04/07/19 1400 04/07/19 1415 04/07/19 1430 04/07/19 1445  BP: (!) 171/106  (!) 174/102   Pulse:   72   Resp: 14 (!) 21 15 17   Temp:      TempSrc:      SpO2: 95%  96%   Weight:      Height:       Eyes: PERRL, EOMI, lids and conjunctivae normal ENMT: Mucous membranes are moist. Posterior pharynx clear of any exudate or lesions.Normal dentition.  Neck: normal, supple, no masses, no thyromegaly Respiratory: clear to auscultation bilaterally, no wheezing, no  crackles. Normal respiratory effort. No accessory muscle use.  Cardiovascular: Irregular rate and rhythm, no murmurs / rubs / gallops. No extremity edema. 2+ pedal pulses. No carotid bruits.  Abdomen: no tenderness, no masses palpated. No hepatosplenomegaly. Bowel sounds positive.  Musculoskeletal: no clubbing / cyanosis. No joint deformity upper and lower extremities. Good ROM, no contractures. Normal muscle tone.  Skin: no rashes, lesions, ulcers. No induration Neurologic: CN 2-12 grossly intact. Sensation intact, DTR normal. Strength 5/5 in all 4.  Psychiatric: Normal judgment and insight. Alert and oriented x 3. Normal mood.    Labs on Admission: I have personally  reviewed following labs and imaging studies  CBC: Recent Labs  Lab 04/07/19 0529  WBC 4.5  HGB 13.2  HCT 41.2  MCV 78.6*  PLT 299   Basic Metabolic Panel: Recent Labs  Lab 04/07/19 0529  NA 138  K 3.6  CL 96*  CO2 28  GLUCOSE 168*  BUN 22  CREATININE 1.07  CALCIUM 9.4  MG 1.9  PHOS 3.3   GFR: Estimated Creatinine Clearance: 86 mL/min (by C-G formula based on SCr of 1.07 mg/dL). Liver Function Tests: Recent Labs  Lab 04/07/19 0529  AST 23  ALT 14  ALKPHOS 66  BILITOT 0.8  PROT 8.7*  ALBUMIN 4.2   Recent Labs  Lab 04/07/19 0529  LIPASE 22   No results for input(s): AMMONIA in the last 168 hours. Coagulation Profile: Recent Labs  Lab 04/07/19 1134  INR 1.1   Cardiac Enzymes: No results for input(s): CKTOTAL, CKMB, CKMBINDEX, TROPONINI in the last 168 hours. BNP (last 3 results) No results for input(s): PROBNP in the last 8760 hours. HbA1C: No results for input(s): HGBA1C in the last 72 hours. CBG: Recent Labs  Lab 04/02/19 0628 04/02/19 0925 04/02/19 1031  GLUCAP 103* 117* 82   Lipid Profile: No results for input(s): CHOL, HDL, LDLCALC, TRIG, CHOLHDL, LDLDIRECT in the last 72 hours. Thyroid Function Tests: Recent Labs    04/07/19 0529  TSH 0.487   Anemia Panel: No results  for input(s): VITAMINB12, FOLATE, FERRITIN, TIBC, IRON, RETICCTPCT in the last 72 hours. Urine analysis:    Component Value Date/Time   COLORURINE YELLOW (A) 04/07/2019 1028   APPEARANCEUR CLEAR (A) 04/07/2019 1028   APPEARANCEUR Clear 01/25/2013 1042   LABSPEC 1.035 (H) 04/07/2019 1028   LABSPEC 1.026 01/25/2013 1042   PHURINE 6.0 04/07/2019 1028   GLUCOSEU NEGATIVE 04/07/2019 1028   GLUCOSEU Negative 01/25/2013 1042   HGBUR NEGATIVE 04/07/2019 1028   BILIRUBINUR NEGATIVE 04/07/2019 1028   BILIRUBINUR Negative 01/25/2013 1042   KETONESUR NEGATIVE 04/07/2019 1028   PROTEINUR 100 (A) 04/07/2019 1028   UROBILINOGEN 1.0 04/28/2011 1934   NITRITE NEGATIVE 04/07/2019 1028   LEUKOCYTESUR NEGATIVE 04/07/2019 1028   LEUKOCYTESUR Negative 01/25/2013 1042    Radiological Exams on Admission: Ct Abdomen Pelvis W Contrast  Result Date: 04/07/2019 CLINICAL DATA:  Nausea and vomiting.  Abdominal pain. EXAM: CT ABDOMEN AND PELVIS WITH CONTRAST TECHNIQUE: Multidetector CT imaging of the abdomen and pelvis was performed using the standard protocol following bolus administration of intravenous contrast. CONTRAST:  135m OMNIPAQUE IOHEXOL 300 MG/ML  SOLN COMPARISON:  06/22/2018 FINDINGS: Lower chest:  Cardiomegaly.  Stable mild scarring at the bases Hepatobiliary: No focal liver abnormality.No evidence of biliary obstruction or stone. Pancreas: Unremarkable. Spleen: Unremarkable. Adrenals/Urinary Tract: Negative adrenals. Dense non enhancement of a moderate area in the upper and interpolar left kidney compatible with segmental arterial occlusion. The main left renal artery is widely patent. No accessory renal artery with seen on prior study. Best seen on sagittal reformats there is an associated segmental renal artery occlusion. Mild right lower pole renal cortical scarring. No hydronephrosis. Unremarkable bladder. Stomach/Bowel: No obstruction. No evidence of bowel inflammation including appendicitis  Vascular/Lymphatic: Unremarkable appearance of IVC filter. Luminal or mural calcification at the bilateral common femoral vein compatible with sequela of remote DVT. Atherosclerotic calcification. No aortic ulceration or dissection. Left renal artery finding as described. Reproductive:No pathologic findings. Other: No ascites or pneumoperitoneum. Musculoskeletal: Spondylosis with remarkably bulky spurring and multilevel bridging osteophyte. IMPRESSION: 1. Segmental left renal artery occlusion with  large renal infarct. No underlying dissection or definite embolic source identified. No evident area of infarction or ischemia in the other viscera. 2. Cardiomegaly 3.  Aortic Atherosclerosis (ICD10-I70.0). 4. Bilateral deep femoral vein calcification correlating with remote DVT. An IVC filter is in place.   EKG: Independently reviewed.a.fibrillation at 63.  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  Question of cholesterol emboli.  She is to get angiogram in the morning per vascular's.  Patient's continued on his statin therapy with atorvastatin.  History of DVT/PE: Patient to be restarted on Eliquis postprocedural. Patient also to consider follow-up with hematologist for hypercoagulable work-up.  History of CVA with dysarthria: Continue Eliquis therapy postprocedural.  Diabetes mellitus type 2: Sliding scale regimen Accu-Cheks before meals and at bedtime.  Hypertension:  Patient is started on low-dose hydralazine with parameters, patient's creatinine is within normal limits however, patient's losartan 50 mg, meloxicam 15 mg,  metformin 500 mg, held as he will be getting contrast.  Patient continued on his metoprolol 25 mg twice a day regimen.   DVT prophylaxis: Heparin GTT Code Status: Full code Family Communication: None at bedside. Disposition Plan: Expect discharge within 2 to 3 days to home.  Consults called: Vascular surgery Dr. Lorenso Courier Admission status: Inpatient admission to [place] with telemetry monitoring.    Para Skeans MD [facility] If 7PM-7AM, please contact night-coverage [email or web address] Password [facility]  04/07/2019, 3:19 PM

## 2019-04-07 NOTE — Consult Note (Addendum)
ANTICOAGULATION CONSULT NOTE - Initial Consult  Pharmacy Consult for Heparin Indication: DVT  No Known Allergies  Patient Measurements: Height: 6\' 2"  (188 cm) Weight: 250 lb (113.4 kg) IBW/kg (Calculated) : 82.2 Heparin Dosing Weight: 105.9 kg  Vital Signs: Temp: 98 F (36.7 C) (11/21 0518) Temp Source: Oral (11/21 0518) BP: 184/99 (11/21 1130) Pulse Rate: 72 (11/21 1130)  Labs: Recent Labs    04/07/19 0529 04/07/19 1134  HGB 13.2  --   HCT 41.2  --   PLT 248  --   APTT  --  27  LABPROT  --  13.9  INR  --  1.1  CREATININE 1.07  --     Estimated Creatinine Clearance: 86 mL/min (by C-G formula based on SCr of 1.07 mg/dL).   Medical History: Past Medical History:  Diagnosis Date  . Arthritis   . Atrial fibrillation ([place])   . Cocaine abuse ([place])    + UDS on admission  . Diabetes mellitus without complication ([place])   . Gout current and history of  . History of deep vein thrombosis 2006  . History of tobacco abuse    has smoked for 50 years  . Hypertension   . Pulmonary embolism ([place]) 2010  . Stroke [facility]) 2017   affected speech and right side-uses cane    Medications:  Apixaban on prior to admission medication list. Out-patient prescription appears expired since 11/10. Per RN last dose was yesterday.   Assessment: Patient is a 68 y/o M with medical history of DVT, PE, CVA, gout, diabetes, hypertension who presented to [facility] ED 11/21 c/o abdominal pain. He reports eating something bad and several episodes of non-black non-bloody emesis. 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 CT abdomen pelvis w contrast impression of left renal artery occlusion and bilateral deep vein calcification correlating with remote DVT with IVC filter in place. Pharmacy has been consulted for heparin drip for VTE treatment. Baseline aPTT and INR within normal limits. Hgb and plt within normal limits.   Goal of Therapy:  aPTT 66-102 seconds Monitor  platelets by anticoagulation protocol: Yes   Plan:  -Heparin 2000 unit IV bolus x 1 followed by continuous infusion at 1600 units/hr -aPTT in 6 hours, will follow aPTT in view of apixaban interference of heparin level -Daily CBC per protocol  [place] Resident 04/07/2019,12:07 PM

## 2019-04-07 NOTE — ED Notes (Signed)
Pt c/o of LLQ abd pain since last night. Denies N&V&D&fever. A&O. Denies hx of kidney stones and diverticulitis. Denies urinary symptoms. Recent L arm surgery. Last BM 2 days ago.

## 2019-04-07 NOTE — Consult Note (Signed)
Reason for Consult:LEFT segmental Renal Infarct/Occlusion Referring Physician: Dr. Manning [name] [name] is an 68 y.o. male.  HPI: Patient with AFIB, DVTs, history of CVA, PE, HTN presents with left flank and abdominal pain that began this morning. He states it was sudden and sharp. Denies nausea, vomiting, diarrhea. Denies syncope. He had elbow surgery last week and was off of his Eliquis for 5-6 days. He only took his first dose last night. 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   Past Medical History:  Diagnosis Date  . Arthritis   . Atrial fibrillation ([place])   . Cocaine abuse ([place])    + UDS on admission  . Diabetes mellitus without complication ([place])   . Gout current and history of  . History of deep vein thrombosis 2006  . History of tobacco abuse    has smoked for 50 years  . Hypertension   . Pulmonary embolism ([place]) 2010  . Stroke [name]) 2017   affected speech and right side-uses cane    Past Surgical History:  Procedure Laterality Date  . COLONOSCOPY    . OLECRANON BURSECTOMY Left 04/02/2019   Procedure: OLECRANON BURSA;  Surgeon: Earnestine Leys, MD;  Location: [facility] ORS;  Service: Orthopedics;  Laterality: Left;  [name] VASCULAR SURGERY     Stent in leg    Family History  Problem Relation Age of Onset  . Aneurysm Mother   . Diabetes Father     Social History:  reports that he has been smoking cigarettes. He has a 25.00 pack-year smoking history. He has never used smokeless tobacco. He reports that he does not drink alcohol or use drugs.  Allergies: No Known Allergies  Medications: I have reviewed the patient's current medications.  Results for orders placed or performed during the hospital encounter of 04/07/19 (from the past 48 hour(s))  Lipase, blood     Status: None   Collection Time: 04/07/19  5:29 AM  Result Value Ref Range    Lipase 22 11 - 51 U/L    Comment: Performed at [facility], [address] [place], [place] [address]  Comprehensive metabolic panel     Status: Abnormal   Collection Time: 04/07/19  5:29 AM  Result Value Ref Range   Sodium 138 135 - 145 mmol/L   Potassium 3.6 3.5 - 5.1 mmol/L   Chloride 96 (L) 98 - 111 mmol/L   CO2 28 22 - 32 mmol/L   Glucose, Bld 168 (H) 70 - 99 mg/dL   BUN 22 8 - 23 mg/dL   Creatinine, Ser 1.07 0.61 - 1.24 mg/dL   Calcium 9.4 8.9 - 10.3 mg/dL   Total Protein 8.7 (H) 6.5 - 8.1 g/dL   Albumin 4.2 3.5 - 5.0 g/dL   AST 23 15 - 41 U/L   ALT 14 0 - 44 U/L   Alkaline Phosphatase 66 38 - 126 U/L   Total Bilirubin 0.8 0.3 - 1.2 mg/dL   GFR calc non Af Amer >60 >60 mL/min   GFR calc Af Amer >60 >60 mL/min   Anion gap 14 5 - 15    Comment: Performed at [facility], [address] [place], [place] [address]  CBC     Status: Abnormal   Collection Time: 04/07/19  5:29 AM  Result Value Ref Range   WBC 4.5 4.0 - 10.5 K/uL   RBC 5.24 4.22 -  5.81 MIL/uL   Hemoglobin 13.2 13.0 - 17.0 g/dL   HCT 41.2 39.0 - 52.0 %   MCV 78.6 (L) 80.0 - 100.0 fL   MCH 25.2 (L) 26.0 - 34.0 pg   MCHC 32.0 30.0 - 36.0 g/dL   RDW 16.0 (H) 11.5 - 15.5 %   Platelets 248 150 - 400 K/uL   nRBC 0.0 0.0 - 0.2 %    Comment: Performed at [facility], [address] [place], [place] [address]  Urinalysis, Complete w Microscopic     Status: Abnormal   Collection Time: 04/07/19 10:28 AM  Result Value Ref Range   Color, Urine YELLOW (A) YELLOW   APPearance CLEAR (A) CLEAR   Specific Gravity, Urine 1.035 (H) 1.005 - 1.030   pH 6.0 5.0 - 8.0   Glucose, UA NEGATIVE NEGATIVE mg/dL   Hgb urine dipstick NEGATIVE NEGATIVE   Bilirubin Urine NEGATIVE NEGATIVE   Ketones, ur NEGATIVE NEGATIVE mg/dL   Protein, ur 100 (A) NEGATIVE mg/dL   Nitrite NEGATIVE NEGATIVE   Leukocytes,Ua NEGATIVE NEGATIVE   RBC / HPF 0-5 0 - 5 RBC/hpf   WBC, UA 0-5 0 - 5 WBC/hpf   Bacteria, UA  NONE SEEN NONE SEEN   Squamous Epithelial / LPF 0-5 0 - 5   Mucus PRESENT     Comment: Performed at [facility] [address] [place], [place] [address]  Protime-INR     Status: None   Collection Time: 04/07/19 11:34 AM  Result Value Ref Range   Prothrombin Time 13.9 11.4 - 15.2 seconds   INR 1.1 0.8 - 1.2    Comment: (NOTE) INR goal varies based on device and disease states. Performed at [facility], [address] [place], [place] [address]   APTT     Status: None   Collection Time: 04/07/19 11:34 AM  Result Value Ref Range   aPTT 27 24 - 36 seconds    Comment: Performed at [facility], [address] [place], [place] [address]    Ct Abdomen Pelvis W Contrast  Result Date: 04/07/2019 CLINICAL DATA:  Nausea and vomiting.  Abdominal pain. EXAM: CT ABDOMEN AND PELVIS WITH CONTRAST TECHNIQUE: Multidetector CT imaging of the abdomen and pelvis was performed using the standard protocol following bolus administration of intravenous contrast. CONTRAST:  140mL OMNIPAQUE IOHEXOL 300 MG/ML  SOLN COMPARISON:  06/22/2018 FINDINGS: Lower chest:  Cardiomegaly.  Stable mild scarring at the bases Hepatobiliary: No focal liver abnormality.No evidence of biliary obstruction or stone. Pancreas: Unremarkable. Spleen: Unremarkable. Adrenals/Urinary Tract: Negative adrenals. Dense non enhancement of a moderate area in the upper and interpolar left kidney compatible with segmental arterial occlusion. The main left renal artery is widely patent. No accessory renal artery with seen on prior study. Best seen on sagittal reformats there is an associated segmental renal artery occlusion. Mild right lower pole renal cortical scarring. No hydronephrosis. Unremarkable bladder. Stomach/Bowel: No obstruction. No evidence of bowel inflammation including appendicitis Vascular/Lymphatic: Unremarkable appearance of IVC filter. Luminal or mural calcification at the bilateral common femoral  vein compatible with sequela of remote DVT. Atherosclerotic calcification. No aortic ulceration or dissection. Left renal artery finding as described. Reproductive:No pathologic findings. Other: No ascites or pneumoperitoneum. Musculoskeletal: Spondylosis with remarkably bulky spurring and multilevel bridging osteophyte. IMPRESSION: 1. 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 No underlying dissection or definite embolic source identified. No evident area of infarction or ischemia in the other viscera. 2. Cardiomegaly 3.  Aortic  Atherosclerosis (ICD10-I70.0). 4. Bilateral deep femoral vein calcification correlating with remote DVT. An IVC filter is in place. Electronically Signed   By: Monte Fantasia M.D.   On: 04/07/2019 09:40    Review of Systems  Constitutional: Negative for chills, fever, malaise/fatigue and weight loss.  HENT: Negative.   Eyes: Negative.   Respiratory: Negative for cough, sputum production and shortness of breath.   Cardiovascular: Positive for palpitations. Negative for chest pain, orthopnea, claudication and leg swelling.  Gastrointestinal: Positive for abdominal pain. Negative for constipation and diarrhea.  Genitourinary: Positive for flank pain. Negative for hematuria.  Skin: Negative.   Neurological: Negative.   Endo/Heme/Allergies: Negative.   Psychiatric/Behavioral: Negative.    Blood pressure (!) 184/99, pulse 72, temperature 98 F (36.7 C), temperature source Oral, resp. rate 16, height 6\' 2"  (1.88 m), weight 113.4 kg, SpO2 98 %. Physical Exam  Nursing note and vitals reviewed. Constitutional: He is oriented to person, place, and time. He appears well-developed and well-nourished.  HENT:  Head: Normocephalic.  Neck: Normal range of motion. Neck supple.  Cardiovascular: Normal rate.  IRREGULAR- AFIB  Respiratory: Effort normal and breath sounds normal. No stridor. No respiratory distress. He has no wheezes.  GI: Soft. Bowel sounds are  normal. He exhibits no distension. There is abdominal tenderness. There is no rebound and no guarding.  LEFT Flank tenderness, no rebound, no guarding  Musculoskeletal: Normal range of motion.        General: No tenderness or edema.  Neurological: He is alert and oriented to person, place, and time.  Skin: Skin is warm.    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  Patient likely had an embolic event with history of AFIB, DVTs off anticoagulation for his elbow surgery last week.   The main renal artery is patent. An upper pole segmental artery is partially occluded with territorial renal parenchymal infarction.    Recommend conservative management at this time:  Heparin gtt. Will need coumadin.  Monitor renal function closely- IVF hydration. ECHO- known AFIb and embolic event BP Control Pain control  ,  A 04/07/2019, 1:02 PM

## 2019-04-07 NOTE — ED Notes (Signed)
Pt finished drinking oral contrast, CT notified.

## 2019-04-07 NOTE — ED Provider Notes (Signed)
[facility] Emergency Department Provider Note ____________________________________________   First MD Initiated Contact with Patient 04/07/19 0725     (approximate)  I have reviewed the triage vital signs and the nursing notes.  HISTORY  Chief Complaint Abdominal Pain  HPI [name] is a 68 y.o. male for evaluation abdominal pain  Patient reports that he began experiencing quite a bit of abdominal pain starting about 10 or 11 PM last night  Reports he ate something that he is pretty sure was old chicken, within an hour of that he started feeling real nauseated, vomited several times nonblack nonbloody.  Reports since then he has been continued to have pain and cramping abdominal discomfort especially mid lower abdomen.  Last bowel movement was 2 days ago normal.  His arms were recovering well after his elbow surgery with Dr. Sabra Heck  Denies COVID-19 symptoms.  No exposure.  Recently tested negative as well.  Reports moderate to severe pain lower abdomen primarily crampy.  Denies any trouble urinating.  Reports she is able to urinate normally without any difficulty.   Past Medical History:  Diagnosis Date  . Arthritis   . Atrial fibrillation ([place])   . Cocaine abuse ([place])    + UDS on admission  . Diabetes mellitus without complication ([place])   . Gout current and history of  . History of deep vein thrombosis 2006  . History of tobacco abuse    has smoked for 50 years  . Hypertension   . Pulmonary embolism ([place]) 2010  . Stroke [facility]) 2017   affected speech and right side-uses cane    Patient Active Problem List   Diagnosis Date Noted  . Abdominal pain 04/07/2019  . Pain due to onychomycosis of toenails of both feet 11/20/2018  . Diabetic neuropathy ([place]) 11/20/2018  . Influenza A 06/22/2018  . Community acquired pneumonia 06/22/2018  . Arthritis 05/12/2017  . Varicose veins of leg with swelling, bilateral 01/18/2017  . Facial twitching    . Cerebral infarction ([place]) 10/03/2016  . 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 Pneumonia 10/12/2014  . Osteoarthritis of right knee 02/11/2014  . Pain and swelling of knee 01/08/2014  . Pain in extremity 01/08/2014  . Weakness due to cerebrovascular accident 01/08/2014  . Pulmonary embolism ([place]) 06/19/2012  . Gout 09/22/2011  . 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 CVA (cerebral infarction) 04/26/2011  . Diabetes mellitus ([place]) 04/26/2011  . 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 Anemia 04/26/2011  . DVT (deep venous thrombosis) ([place]) 04/26/2011  . Essential (primary) hypertension 04/26/2011    Past Surgical History:  Procedure Laterality Date  . COLONOSCOPY    . OLECRANON BURSECTOMY Left 04/02/2019   Procedure: OLECRANON BURSA;  Surgeon: Earnestine Leys, MD;  Location: [facility] ORS;  Service: Orthopedics;  Laterality: Left;  [name] VASCULAR SURGERY     Stent in leg    Prior to Admission medications   Medication Sig Start Date End Date Taking? Authorizing Provider  allopurinol (ZYLOPRIM) 100 MG tablet Take 100 mg by mouth 2 (two) times daily.  09/18/18   [provider]  apixaban (ELIQUIS) 5 MG TABS tablet Take 1 tablet (5 mg total) by mouth 2 (two) times daily for 30 days. 06/24/18 03/27/19  Saundra Shelling, MD  furosemide (LASIX) 40 MG tablet Take 1 tablet (40 mg total) by mouth daily for 30 days. Patient taking differently: Take 40 mg by mouth 2 (two) times daily.  06/24/18  03/18/20  Saundra Shelling, MD  gabapentin (NEURONTIN) 400 MG capsule Take 1 capsule (400 mg total) by mouth 3 (three) times daily. 04/02/19   Earnestine Leys, MD  HYDROcodone-acetaminophen (NORCO) 5-325 MG tablet Take 1-2 tablets by mouth every 6 (six) hours as needed. 04/02/19   Earnestine Leys, MD  losartan (COZAAR) 50 MG tablet Take 50 mg by mouth every morning.  04/10/18 04/10/19  [provider]  meloxicam (MOBIC) 15 MG tablet Take 1 tablet (15 mg  total) by mouth daily. 04/02/19   Earnestine Leys, MD  metFORMIN (GLUCOPHAGE) 500 MG tablet Take 500 mg by mouth 2 (two) times daily with a meal.  04/12/17   [provider]  metoprolol tartrate (LOPRESSOR) 25 MG tablet Take 25 mg by mouth 2 (two) times daily.    [provider]  potassium chloride (K-DUR) 10 MEQ tablet Take 10 mEq by mouth daily.  02/09/18   [provider]  simvastatin (ZOCOR) 20 MG tablet Take 20 mg by mouth at bedtime. 02/07/18   [provider]  traMADol (ULTRAM) 50 MG tablet Take 50 mg by mouth every 6 (six) hours. 02/27/19   [provider]    Allergies Patient has no known allergies.  Family History  Problem Relation Age of Onset  . Aneurysm Mother   . Diabetes Father     Social History Social History   Tobacco Use  . Smoking status: Current Every Day Smoker    Packs/day: 0.50    Years: 50.00    Pack years: 25.00    Types: Cigarettes  . Smokeless tobacco: Never Used  Substance Use Topics  . Alcohol use: No  . Drug use: No    Comment: [facility] drug screen + cocaine on admit-last used cocaine in 2015    Review of Systems Constitutional: No fever/chills no exposure to Covid Eyes: No visual changes. ENT: No sore throat. Cardiovascular: Denies chest pain. Respiratory: Denies shortness of breath. Gastrointestinal: See HPI Genitourinary: Negative for dysuria. Musculoskeletal: Negative for back pain. Skin: Negative for rash. Neurological: Negative for headaches, areas of focal weakness or numbness.    ____________________________________________   PHYSICAL EXAM:  VITAL SIGNS: ED Triage Vitals  Enc Vitals Group     BP 04/07/19 0518 (!) 177/96     Pulse Rate 04/07/19 0518 60     Resp 04/07/19 0518 20     Temp 04/07/19 0518 98 F (36.7 C)     Temp Source 04/07/19 0518 Oral     SpO2 04/07/19 0518 96 %     Weight 04/07/19 0516 250 lb (113.4 kg)     Height 04/07/19 0516 6\' 2"  (1.88 m)     Head  Circumference --      Peak Flow --      Pain Score --      Pain Loc --      Pain Edu? --      Excl. in [place]? --     Constitutional: Alert and oriented.  Appears in moderate pain, holding his hand over his mid abdomen, also appears nauseated eyes: Conjunctivae are normal. Head: Atraumatic. Nose: No congestion/rhinnorhea. Mouth/Throat: Mucous membranes are moist. Neck: No stridor.  Cardiovascular: Normal rate, regular rhythm. Grossly normal heart sounds.  Good peripheral circulation. Respiratory: Normal respiratory effort.  No retractions. Lungs CTAB. Gastrointestinal: Soft and no distention, but reports moderate tenderness especially across the mid to lower suprapubic region.  No groin pain or masses.  no noted hernias Musculoskeletal: No lower extremity tenderness nor  edema.  Left upper extremities warm well perfused, in splint.  Patient reports it is healing well not causing him any discomfort at this time. Neurologic: Somewhat thickened speech and language. No gross focal neurologic deficits are appreciated.  Skin:  Skin is warm, dry and intact. No rash noted. Psychiatric: Mood and affect are normal. Speech is somewhat thickened, patient reports that chronic from previous stroke, and behavior are normal.  ____________________________________________   LABS (all labs ordered are listed, but only abnormal results are displayed)  Labs Reviewed  COMPREHENSIVE METABOLIC PANEL - Abnormal; Notable for the following components:      Result Value   Chloride 96 (*)    Glucose, Bld 168 (*)    Total Protein 8.7 (*)    All other components within normal limits  CBC - Abnormal; Notable for the following components:   MCV 78.6 (*)    MCH 25.2 (*)    RDW 16.0 (*)    All other components within normal limits  URINALYSIS, COMPLETE (UACMP) WITH MICROSCOPIC - Abnormal; Notable for the following components:   Color, Urine YELLOW (*)    APPearance CLEAR (*)    Specific Gravity, Urine 1.035 (*)     Protein, ur 100 (*)    All other components within normal limits  SARS CORONAVIRUS 2 (TAT 6-24 HRS)  LIPASE, BLOOD  PROTIME-INR  APTT  URINE DRUG SCREEN, QUALITATIVE ([facility] ONLY)  HEPARIN LEVEL (UNFRACTIONATED)  MAGNESIUM  PHOSPHORUS  TSH  HEMOGLOBIN A1C   ____________________________________________  EKG  Reviewed entered by me at 1139 heart rate 69 QRS 109 QTc 430 Course A. Fib vs flutter with NSTWA ____________________________________________  RADIOLOGY  Ct Abdomen Pelvis W Contrast  Result Date: 04/07/2019 CLINICAL DATA:  Nausea and vomiting.  Abdominal pain. EXAM: CT ABDOMEN AND PELVIS WITH CONTRAST TECHNIQUE: Multidetector CT imaging of the abdomen and pelvis was performed using the standard protocol following bolus administration of intravenous contrast. CONTRAST:  16mL OMNIPAQUE IOHEXOL 300 MG/ML  SOLN COMPARISON:  06/22/2018 FINDINGS: Lower chest:  Cardiomegaly.  Stable mild scarring at the bases Hepatobiliary: No focal liver abnormality.No evidence of biliary obstruction or stone. Pancreas: Unremarkable. Spleen: Unremarkable. Adrenals/Urinary Tract: Negative adrenals. Dense non enhancement of a moderate area in the upper and interpolar left kidney compatible with segmental arterial occlusion. The main left renal artery is widely patent. No accessory renal artery with seen on prior study. Best seen on sagittal reformats there is an associated segmental renal artery occlusion. Mild right lower pole renal cortical scarring. No hydronephrosis. Unremarkable bladder. Stomach/Bowel: No obstruction. No evidence of bowel inflammation including appendicitis Vascular/Lymphatic: Unremarkable appearance of IVC filter. Luminal or mural calcification at the bilateral common femoral vein compatible with sequela of remote DVT. Atherosclerotic calcification. No aortic ulceration or dissection. Left renal artery finding as described. Reproductive:No pathologic findings. Other: No ascites or  pneumoperitoneum. Musculoskeletal: Spondylosis with remarkably bulky spurring and multilevel bridging osteophyte. IMPRESSION: 1. 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 No underlying dissection or definite embolic source identified. No evident area of infarction or ischemia in the other viscera. 2. Cardiomegaly 3.  Aortic Atherosclerosis (ICD10-I70.0). 4. Bilateral deep femoral vein calcification correlating with remote DVT. An IVC filter is in place. Electronically Signed   By: Monte Fantasia M.D.   On: 04/07/2019 09:40    Imaging study reviewed, concerning for left renal artery occlusion. ____________________________________________   PROCEDURES  Procedure(s) performed: None  Procedures  Critical Care performed: Yes, see critical care note(s)  CRITICAL CARE Performed  by: Delman Kitten   Total critical care time: 35 minutes  Critical care time was exclusive of separately billable procedures and treating other patients.  Critical care was necessary to treat or prevent imminent or life-threatening deterioration.  Critical care was time spent personally by me on the following activities: development of treatment plan with patient and/or surrogate as well as nursing, discussions with consultants, evaluation of patient's response to treatment, examination of patient, obtaining history from patient or surrogate, ordering and performing treatments and interventions, ordering and review of laboratory studies, ordering and review of radiographic studies, pulse oximetry and re-evaluation of patient's condition.  ____________________________________________   INITIAL IMPRESSION / ASSESSMENT AND PLAN / ED COURSE  Pertinent labs & imaging results that were available during my care of the patient were reviewed by me and considered in my medical decision making (see chart for details).   Differential diagnosis includes but is not limited to, abdominal perforation, aortic  dissection, cholecystitis, appendicitis, diverticulitis, colitis, esophagitis/gastritis, kidney stone, pyelonephritis, urinary tract infection, aortic aneurysm. All are considered in decision and treatment plan. Based upon the patient's presentation and risk factors, will provide pain medicine antiemetics and proceed with CT abdomen pelvis.  No associated neurologic cardiac vascular or pulmonary symptoms.   Clinical Course as of Apr 06 1216  Sat Apr 07, 2019  0850 Labs reviewed.  Reassuring   [MQ]  1154 Paged vascular surgery, had sent haiku message but have not yet heard back.  Patient pain currently controlled.  Patient understanding comfortable with plan to be admitted.  Reports he has been compliant with his anticoagulant (eliquis) and last took it last night   [MQ]    Clinical Course User Index [MQ] Delman Kitten, MD   ----------------------------------------- 12:03 PM on 04/07/2019 -----------------------------------------  Discussed with Dr. Lorenso Courier vascular surgery, recommends patient be placed on heparin as anticoagulant at present time.  She will see patient in consult within about 1 hour.  Admit to hospital, vascular surgery final consultation recommendations to follow  ____________________________________________   FINAL CLINICAL IMPRESSION(S) / ED DIAGNOSES  Final diagnoses:  Renal artery occlusion [facility])        Note:  This document was prepared using Dragon voice recognition software and may include unintentional dictation errors       Delman Kitten, MD 04/07/19 1642

## 2019-04-07 NOTE — ED Notes (Signed)
Pt given meal tray. Pt able to eat on side of bed without trouble.

## 2019-04-07 NOTE — ED Notes (Signed)
Pt given ginger ale.

## 2019-04-07 NOTE — ED Triage Notes (Signed)
Patient reports lower abdominal pain with nausea and vomiting since last night.

## 2019-04-08 ENCOUNTER — Inpatient Hospital Stay
Admit: 2019-04-08 | Discharge: 2019-04-08 | Disposition: A | Discharge status: Home / Self Care | Payer: Medicare Other | Attending: Internal Medicine | Admitting: Internal Medicine

## 2019-04-08 ENCOUNTER — Inpatient Hospital Stay: Payer: Medicare Other

## 2019-04-08 DIAGNOSIS — I825Y3 Chronic embolism and thrombosis of unspecified deep veins of proximal lower extremity, bilateral: Secondary | ICD-10-CM

## 2019-04-08 DIAGNOSIS — A419 Sepsis, unspecified organism: Secondary | ICD-10-CM

## 2019-04-08 DIAGNOSIS — R509 Fever, unspecified: Secondary | ICD-10-CM | POA: Diagnosis present

## 2019-04-08 DIAGNOSIS — I69328 Other speech and language deficits following cerebral infarction: Secondary | ICD-10-CM

## 2019-04-08 DIAGNOSIS — I482 Chronic atrial fibrillation, unspecified: Secondary | ICD-10-CM | POA: Diagnosis present

## 2019-04-08 DIAGNOSIS — E119 Type 2 diabetes mellitus without complications: Secondary | ICD-10-CM

## 2019-04-08 DIAGNOSIS — I10 Essential (primary) hypertension: Secondary | ICD-10-CM

## 2019-04-08 DIAGNOSIS — N280 Ischemia and infarction of kidney: Principal | ICD-10-CM

## 2019-04-08 LAB — BRAIN NATRIURETIC PEPTIDE: B Natriuretic Peptide: 298 pg/mL — ABNORMAL HIGH (ref 0.0–100.0)

## 2019-04-08 LAB — EXPECTORATED SPUTUM ASSESSMENT W GRAM STAIN, RFLX TO RESP C

## 2019-04-08 LAB — ECHOCARDIOGRAM COMPLETE
Height: 74 in
Weight: 4000 oz

## 2019-04-08 LAB — CBC
HCT: 36.7 % — ABNORMAL LOW (ref 39.0–52.0)
Hemoglobin: 11.9 g/dL — ABNORMAL LOW (ref 13.0–17.0)
MCH: 25.1 pg — ABNORMAL LOW (ref 26.0–34.0)
MCHC: 32.4 g/dL (ref 30.0–36.0)
MCV: 77.3 fL — ABNORMAL LOW (ref 80.0–100.0)
Platelets: 206 10*3/uL (ref 150–400)
RBC: 4.75 MIL/uL (ref 4.22–5.81)
RDW: 16 % — ABNORMAL HIGH (ref 11.5–15.5)
WBC: 10.2 10*3/uL (ref 4.0–10.5)
nRBC: 0 % (ref 0.0–0.2)

## 2019-04-08 LAB — GLUCOSE, CAPILLARY
Glucose-Capillary: 91 mg/dL (ref 70–99)
Glucose-Capillary: 90 mg/dL (ref 70–99)
Glucose-Capillary: 98 mg/dL (ref 70–99)
Glucose-Capillary: 118 mg/dL — ABNORMAL HIGH (ref 70–99)

## 2019-04-08 LAB — HEPARIN LEVEL (UNFRACTIONATED): Heparin Unfractionated: 0.3 IU/mL (ref 0.30–0.70)

## 2019-04-08 LAB — APTT
aPTT: 71 seconds — ABNORMAL HIGH (ref 24–36)
aPTT: 69 seconds — ABNORMAL HIGH (ref 24–36)

## 2019-04-08 LAB — LACTIC ACID, PLASMA
Lactic Acid, Venous: 1.5 mmol/L (ref 0.5–1.9)
Lactic Acid, Venous: 2.3 mmol/L (ref 0.5–1.9)

## 2019-04-08 LAB — PROCALCITONIN: Procalcitonin: 0.14 ng/mL

## 2019-04-08 MED ORDER — GUAIFENESIN ER 600 MG PO TB12
600.0000 mg | ORAL_TABLET | Freq: Two times a day (BID) | ORAL | Status: DC
  Administered 2019-04-08 – 2019-04-11 (×6): 600 mg via ORAL
  Filled 2019-04-08 (×6): qty 1

## 2019-04-08 MED ORDER — INSULIN ASPART 100 UNIT/ML ~~LOC~~ SOLN
0.0000 [IU] | Freq: Three times a day (TID) | SUBCUTANEOUS | Status: DC
  Administered 2019-04-10: 1 [IU] via SUBCUTANEOUS
  Filled 2019-04-08: qty 1

## 2019-04-08 MED ORDER — SODIUM CHLORIDE 0.9 % IV SOLN: INTRAVENOUS | Status: DC | PRN

## 2019-04-08 MED ORDER — SIMVASTATIN 20 MG PO TABS
20.0000 mg | ORAL_TABLET | Freq: Every day | ORAL | Status: DC
  Administered 2019-04-08 – 2019-04-10 (×3): 20 mg via ORAL
  Filled 2019-04-08 (×4): qty 1

## 2019-04-08 MED ORDER — ALLOPURINOL 100 MG PO TABS
100.0000 mg | ORAL_TABLET | Freq: Two times a day (BID) | ORAL | Status: DC
  Administered 2019-04-08 – 2019-04-11 (×7): 100 mg via ORAL
  Filled 2019-04-08 (×7): qty 1

## 2019-04-08 MED ORDER — POTASSIUM CHLORIDE ER 10 MEQ PO TBCR: 10.0000 meq | EXTENDED_RELEASE_TABLET | Freq: Every day | ORAL | Status: DC

## 2019-04-08 MED ORDER — SENNA 8.6 MG PO TABS
1.0000 | ORAL_TABLET | Freq: Two times a day (BID) | ORAL | Status: DC
  Administered 2019-04-08 – 2019-04-11 (×7): 8.6 mg via ORAL
  Filled 2019-04-08 (×7): qty 1

## 2019-04-08 MED ORDER — DM-GUAIFENESIN ER 30-600 MG PO TB12
1.0000 | ORAL_TABLET | Freq: Two times a day (BID) | ORAL | Status: DC
  Filled 2019-04-08 (×3): qty 1

## 2019-04-08 MED ORDER — LOSARTAN POTASSIUM 50 MG PO TABS
50.0000 mg | ORAL_TABLET | ORAL | Status: DC
  Administered 2019-04-08 – 2019-04-11 (×3): 50 mg via ORAL
  Filled 2019-04-08 (×3): qty 1

## 2019-04-08 MED ORDER — VANCOMYCIN HCL 10 G IV SOLR
2000.0000 mg | Freq: Once | INTRAVENOUS | Status: DC
  Administered 2019-04-08: 2000 mg via INTRAVENOUS
  Filled 2019-04-08: qty 2000

## 2019-04-08 MED ORDER — DM-GUAIFENESIN ER 30-600 MG PO TB12: 1.0000 | ORAL_TABLET | Freq: Two times a day (BID) | ORAL | Status: DC

## 2019-04-08 MED ORDER — SODIUM CHLORIDE 0.9 % IV SOLN
2.0000 g | Freq: Once | INTRAVENOUS | Status: AC
  Administered 2019-04-08: 2 g via INTRAVENOUS
  Filled 2019-04-08: qty 2

## 2019-04-08 MED ORDER — SODIUM CHLORIDE 0.9 % IV SOLN
1.0000 g | Freq: Every day | INTRAVENOUS | Status: DC
  Administered 2019-04-08 – 2019-04-10 (×3): 1 g via INTRAVENOUS
  Filled 2019-04-08: qty 10
  Filled 2019-04-08 (×3): qty 1

## 2019-04-08 MED ORDER — DEXTROMETHORPHAN POLISTIREX ER 30 MG/5ML PO SUER
30.0000 mg | Freq: Two times a day (BID) | ORAL | Status: DC
  Filled 2019-04-08 (×2): qty 5

## 2019-04-08 MED ORDER — VANCOMYCIN HCL IN DEXTROSE 1-5 GM/200ML-% IV SOLN
1000.0000 mg | Freq: Once | INTRAVENOUS | Status: DC
  Filled 2019-04-08: qty 200

## 2019-04-08 MED ORDER — LEVALBUTEROL TARTRATE 45 MCG/ACT IN AERO: 2.0000 | INHALATION_SPRAY | Freq: Four times a day (QID) | RESPIRATORY_TRACT | Status: DC | PRN

## 2019-04-08 MED ORDER — HYDROCODONE-ACETAMINOPHEN 5-325 MG PO TABS
1.0000 | ORAL_TABLET | Freq: Four times a day (QID) | ORAL | Status: DC | PRN
  Administered 2019-04-08 – 2019-04-09 (×3): 1 via ORAL
  Administered 2019-04-10 – 2019-04-11 (×3): 2 via ORAL
  Filled 2019-04-08 (×6): qty 2

## 2019-04-08 MED ORDER — SODIUM CHLORIDE 0.9 % IV SOLN
INTRAVENOUS | Status: DC
  Administered 2019-04-08 – 2019-04-11 (×6): via INTRAVENOUS

## 2019-04-08 MED ORDER — VANCOMYCIN HCL IN DEXTROSE 1-5 GM/200ML-% IV SOLN
1000.0000 mg | Freq: Two times a day (BID) | INTRAVENOUS | Status: DC
  Filled 2019-04-08: qty 200

## 2019-04-08 MED ORDER — SODIUM CHLORIDE 0.9 % IV SOLN
500.0000 mg | Freq: Every day | INTRAVENOUS | Status: DC
  Administered 2019-04-08 – 2019-04-10 (×3): 500 mg via INTRAVENOUS
  Filled 2019-04-08 (×4): qty 500

## 2019-04-08 MED ORDER — HYDRALAZINE HCL 20 MG/ML IJ SOLN
5.0000 mg | INTRAMUSCULAR | Status: DC | PRN
  Administered 2019-04-08: 5 mg via INTRAVENOUS
  Filled 2019-04-08: qty 1

## 2019-04-08 MED ORDER — INSULIN ASPART 100 UNIT/ML ~~LOC~~ SOLN: 0.0000 [IU] | Freq: Every day | SUBCUTANEOUS | Status: DC

## 2019-04-08 MED ORDER — DEXTROMETHORPHAN POLISTIREX ER 30 MG/5ML PO SUER
30.0000 mg | Freq: Two times a day (BID) | ORAL | Status: DC
  Administered 2019-04-08 – 2019-04-11 (×7): 30 mg via ORAL
  Filled 2019-04-08 (×8): qty 5

## 2019-04-08 MED ORDER — SODIUM CHLORIDE 0.9 % IV SOLN
2.0000 g | Freq: Three times a day (TID) | INTRAVENOUS | Status: DC
  Filled 2019-04-08 (×2): qty 2

## 2019-04-08 MED ORDER — LEVALBUTEROL HCL 1.25 MG/0.5ML IN NEBU
1.2500 mg | INHALATION_SOLUTION | Freq: Four times a day (QID) | RESPIRATORY_TRACT | Status: DC | PRN
  Filled 2019-04-08: qty 0.5

## 2019-04-08 MED ORDER — GUAIFENESIN ER 600 MG PO TB12
600.0000 mg | ORAL_TABLET | Freq: Two times a day (BID) | ORAL | Status: DC
  Administered 2019-04-08: 600 mg via ORAL
  Filled 2019-04-08: qty 1

## 2019-04-08 MED ORDER — POLYETHYLENE GLYCOL 3350 17 G PO PACK
17.0000 g | PACK | Freq: Every day | ORAL | Status: DC | PRN
  Administered 2019-04-08: 17 g via ORAL
  Filled 2019-04-08: qty 1

## 2019-04-08 MED ORDER — SODIUM CHLORIDE 0.9 % IV BOLUS
1000.0000 mL | Freq: Once | INTRAVENOUS | Status: AC
  Administered 2019-04-08: 1000 mL via INTRAVENOUS

## 2019-04-08 MED ORDER — GABAPENTIN 400 MG PO CAPS
400.0000 mg | ORAL_CAPSULE | Freq: Three times a day (TID) | ORAL | Status: DC
  Administered 2019-04-08 – 2019-04-11 (×9): 400 mg via ORAL
  Filled 2019-04-08 (×9): qty 1

## 2019-04-08 NOTE — Progress Notes (Addendum)
PT Cancellation Note  Patient Details Name: [name] MRN: [id number] DOB: Jan 13, 1951   Cancelled Treatment:    Reason Eval/Treat Not Completed: Patient not medically ready. will attempt tomorrow.   [address], [place], [place] DPT 04/08/2019, 12:02 PM

## 2019-04-08 NOTE — Consult Note (Signed)
ANTICOAGULATION CONSULT NOTE   Pharmacy Consult for Heparin Indication: DVT  No Known Allergies  Patient Measurements: Height: 6\' 2"  (188 cm) Weight: 250 lb (113.4 kg) IBW/kg (Calculated) : 82.2 Heparin Dosing Weight: 105.9 kg  Vital Signs: Temp: 98.6 F (37 C) (11/21 2136) Temp Source: Oral (11/21 2136) BP: 165/91 (11/21 2136) Pulse Rate: 71 (11/21 2136)  Labs: Recent Labs    04/07/19 0529 04/07/19 1134 04/07/19 1452 04/07/19 2033  HGB 13.2  --   --   --   HCT 41.2  --   --   --   PLT 248  --   --   --   APTT  --  27  --  61*  LABPROT  --  13.9  --   --   INR  --  1.1  --   --   HEPARINUNFRC  --   --  1.32*  --   CREATININE 1.07  --   --   --     Estimated Creatinine Clearance: 86 mL/min (by C-G formula based on SCr of 1.07 mg/dL).   Medical History: Past Medical History:  Diagnosis Date  . Arthritis   . Atrial fibrillation ([place])   . Cocaine abuse ([place])    + UDS on admission  . Diabetes mellitus without complication ([place])   . Gout current and history of  . History of deep vein thrombosis 2006  . History of tobacco abuse    has smoked for 50 years  . Hypertension   . Pulmonary embolism ([place]) 2010  . Stroke [facility]) 2017   affected speech and right side-uses cane    Medications:  Apixaban on prior to admission medication list. Out-patient prescription appears expired since 11/10. Per RN last dose was yesterday.   Assessment: Patient is a 68 y/o M with medical history of DVT, PE, CVA, gout, diabetes, hypertension who presented to [facility] ED 11/21 c/o abdominal pain. He reports eating something bad and several episodes of non-black non-bloody emesis. 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 CT abdomen pelvis w contrast impression of left renal artery occlusion and bilateral deep vein calcification correlating with remote DVT with IVC filter in place. Pharmacy has been consulted for heparin drip for VTE treatment. Baseline aPTT  and INR within normal limits. Hgb and plt within normal limits.   Goal of Therapy:  Heparin level 0.3-0.7 units/ml aPTT 66-102 seconds Monitor platelets by anticoagulation protocol: Yes   Plan:  11/22 @ 0300 heparin drip was not increased to 1800 units/hr until now, will reschedule aPTT for 0900, will f/u w/ am CBC and continue to monitor.  Tobie Lords, PharmD, BCPS Clinical Pharmacist 04/08/2019,3:01 AM

## 2019-04-08 NOTE — Consult Note (Signed)
ANTICOAGULATION CONSULT NOTE   Pharmacy Consult for Heparin Indication: DVT  No Known Allergies  Patient Measurements: Height: 6\' 2"  (188 cm) Weight: 250 lb (113.4 kg) IBW/kg (Calculated) : 82.2 Heparin Dosing Weight: 105.9 kg  Vital Signs: Temp: 98.6 F (37 C) (11/22 1617) Temp Source: Oral (11/22 1617) BP: 167/110 (11/22 1705) Pulse Rate: 96 (11/22 1705)  Labs: Recent Labs    04/07/19 0529  04/07/19 1134 04/07/19 1452 04/07/19 2033 04/08/19 0848 04/08/19 1701  HGB 13.2  --   --   --   --  11.9*  --   HCT 41.2  --   --   --   --  36.7*  --   PLT 248  --   --   --   --  206  --   APTT  --    < > 27  --  61* 71* 69*  LABPROT  --   --  13.9  --   --   --   --   INR  --   --  1.1  --   --   --   --   HEPARINUNFRC  --   --   --  1.32*  --   --  0.30  CREATININE 1.07  --   --   --   --   --   --    < > = values in this interval not displayed.    Estimated Creatinine Clearance: 86 mL/min (by C-G formula based on SCr of 1.07 mg/dL).   Medical History: Past Medical History:  Diagnosis Date  . Arthritis   . Atrial fibrillation ([place])   . Cocaine abuse ([place])    + UDS on admission  . Diabetes mellitus without complication ([place])   . Gout current and history of  . History of deep vein thrombosis 2006  . History of tobacco abuse    has smoked for 50 years  . Hypertension   . Pulmonary embolism ([place]) 2010  . Stroke [facility]) 2017   affected speech and right side-uses cane    Medications:  Apixaban on prior to admission medication list. Out-patient prescription appears expired since 11/10. Per RN last dose was yesterday.   Assessment: Patient is a 68 y/o M with medical history of DVT, PE, CVA, gout, diabetes, hypertension who presented to [facility] ED 11/21 c/o abdominal pain. He reports eating something bad and several episodes of non-black non-bloody emesis. 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 CT abdomen pelvis w contrast impression of  left renal artery occlusion and bilateral deep vein calcification correlating with remote DVT with IVC filter in place. Pharmacy has been consulted for heparin drip for VTE treatment. Baseline aPTT and INR within normal limits. Hgb and plt within normal limits.   Goal of Therapy:  Heparin level 0.3-0.7 units/ml aPTT 66-102 seconds Monitor platelets by anticoagulation protocol: Yes   Plan:  11/22 @ 1701 aPTT = 69; HL @1701  0.30. Correlating and therapeutic (lower end). No interruptions of heparin- except to briefly fix line. Confirmed with nurse no concerns for active bleeding and hgb could be dilutional. Will continue to monitor CBC.   Will increase heparin rate to 1850 units per hour and recheck HL in 6 hours.   Rowland Lathe, [facility] Clinical Pharmacist 04/08/2019,6:57 PM

## 2019-04-08 NOTE — Consult Note (Signed)
ANTICOAGULATION CONSULT NOTE   Pharmacy Consult for Heparin Indication: DVT  No Known Allergies  Patient Measurements: Height: 6\' 2"  (188 cm) Weight: 250 lb (113.4 kg) IBW/kg (Calculated) : 82.2 Heparin Dosing Weight: 105.9 kg  Vital Signs: Temp: 101.1 F (38.4 C) (11/22 0345) Temp Source: Axillary (11/22 0345) BP: 155/86 (11/22 0345) Pulse Rate: 93 (11/22 0345)  Labs: Recent Labs    04/07/19 0529 04/07/19 1134 04/07/19 1452 04/07/19 2033 04/08/19 0848  HGB 13.2  --   --   --  11.9*  HCT 41.2  --   --   --  36.7*  PLT 248  --   --   --  206  APTT  --  27  --  61* 71*  LABPROT  --  13.9  --   --   --   INR  --  1.1  --   --   --   HEPARINUNFRC  --   --  1.32*  --   --   CREATININE 1.07  --   --   --   --     Estimated Creatinine Clearance: 86 mL/min (by C-G formula based on SCr of 1.07 mg/dL).   Medical History: Past Medical History:  Diagnosis Date  . Arthritis   . Atrial fibrillation ([place])   . Cocaine abuse ([place])    + UDS on admission  . Diabetes mellitus without complication ([place])   . Gout current and history of  . History of deep vein thrombosis 2006  . History of tobacco abuse    has smoked for 50 years  . Hypertension   . Pulmonary embolism ([place]) 2010  . Stroke [facility]) 2017   affected speech and right side-uses cane    Medications:  Apixaban on prior to admission medication list. Out-patient prescription appears expired since 11/10. Per RN last dose was yesterday.   Assessment: Patient is a 68 y/o M with medical history of DVT, PE, CVA, gout, diabetes, hypertension who presented to [facility] ED 11/21 c/o abdominal pain. He reports eating something bad and several episodes of non-black non-bloody emesis. 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 CT abdomen pelvis w contrast impression of left renal artery occlusion and bilateral deep vein calcification correlating with remote DVT with IVC filter in place. Pharmacy has been  consulted for heparin drip for VTE treatment. Baseline aPTT and INR within normal limits. Hgb and plt within normal limits.   Goal of Therapy:  Heparin level 0.3-0.7 units/ml aPTT 66-102 seconds Monitor platelets by anticoagulation protocol: Yes   Plan:  11/22 @ 0848 aPTT = 71.  Continue current drip rate.  APTT and HL ordered for tonight at 17:00.   Olivia Canter, [facility] Clinical Pharmacist 04/08/2019,10:25 AM

## 2019-04-08 NOTE — Progress Notes (Addendum)
PROGRESS NOTE    [name]  MEB:[id number] DOB: Apr 25, 1951 DOA: 04/07/2019 PCP: Theotis Burrow, MD    Brief Narrative:    [name] is a 68 y.o. male with medical history significant of atrial fibrillation, diabetes mellitus, hypertension seen in the emergency room for left-sided abdominal pain 10 out of 10 started last night while at rest at home.  Patient states the pain is new onset he has never had similar episode in the past.  He has not used any substances in the past 9 years.  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  His left arm is in a bandage he has recently had surgery olecranon bursectomy that was done on November 16 of this year.  Patient also reports that he had nausea and has had vomiting 3 times since yesterday because of this abdominal pain he denies any changes in bowel movements or bowel habits, patient denies any hematochezia or melena.  Patient also denies any dysuria or hematuria.  Patient also has a history of stent in his leg, patient has an IVC filter.  Family history is significant for aneurysm in the mother, diabetes in his father.  Home meds reviewed consist of Eliquis 5 mg twice daily, meloxicam, Metformin, metoprolol, tramadol.  ED Course:  Blood pressure (!) 174/102, pulse 72, temperature 98 F (36.7 C), temperature source Oral, resp. rate 17, height 6' 2"  (1.88 m), weight 113.4 kg, SpO2 96 %. Initial CBC shows hemoglobin of 13.2, hematocrit of 41, however MCV 78.6, RDW 16, platelet count of 248. Compressive metabolic panel shows a normal sodium of 138, potassium of 3.6, blood glucose of 168, serum creatinine of 1.07, EGFR of 60, AST of 23, ALT of 14.    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 No underlying dissection or definite embolic source identified. No evident area of  infarction or ischemia in the other viscera. 2. Cardiomegaly 3.  Aortic Atherosclerosis (ICD10-I70.0). 4. Bilateral deep femoral vein calcification correlating with remote DVT. An IVC filter is in place.  Interim History: - 11/22: pt developed fever or 101.1, lactic acid 1.5 --> 2.3, unclear source of infection, started Vanco and cefepime - 11/22 CXR pending  Subjective: Patient still has left-sided abdominal pain and left flank pain, which is moderate, nonradiating.  Denies nausea, vomiting or diarrhea.  Patient is constipated in the past several days.  Denies chills, patient has fever of 101.1.  Patient has productive cough, no chest pain.  He has mild shortness of breath.   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 Lorenso Courier was consulted, suspecting embolic event with history of AFIB, DVTs, and is off anticoagulation for his left elbow surgery last week. Dr. Lorenso Courier recommend conservative management at this time: -continue IV heparin -pain control: prn home Norco  History of DVT/PE: was on Eliquis, which is on hold due to left elbow surgery last week.  -Patient to be restarted on Eliquis  at discharge  History of CVA with dysarthria: Continue Eliquis therapy at discharge -Statin  Diabetes mellitus without complication: Y0V 6.8 on admission. Pt is taking Metformin at home -SSI in hosptial  Hypertension: Blood pressure 155/86 -Hold Lasix due to sepsis - losartan -Continue metoprolol -As needed hydralazine  S/of left elbow surgery:  -monitoring  Gout: -continue home  allopurinol  Chronic Atrial Fibrillation: CHA2DS2-VASc Score is 5 , needs oral anticoagulation. Patient was on Eliquis at home. Heart rate is  90-110.  -continue metoprolol -will resume Eliquis at discharge  Fever and sepsis: Source of infection is not clear.  Patient has fever of 101.1.  WBC 4.5.  He meets criteria for sepsis with fever, tachycardia.  Lactic acid is elevated 1.5, 2.3.  Source of infection is not clear.  UA negative. Pt has cough and mild shortness of breath.  Bronchitis and pneumonia differential diagnosis. CXR showed mild left basilar atelectasis and unchanged cardiomegaly, no infiltration. Covid 19 negative. Pt may have acute bronchitis. -started vancomycin and cefepime before CXR is done -->will narrow down to rocephin and azithromycin -Blood culture, sputum culture -will get Procalcitonin and trend lactic acid levels per sepsis protocol. -IVF: 1.5 L of NS bolus in ED, followed by 100 cc/h     DVT prophylaxis: on IV heparin Code Status: rull Family Communication: none Disposition Plan: not ready for discharge. Pt still need IV heparin. Developed and fever and sepsis with unclear source of infection. Will need to be treated in hospital for another 1 or 2 days at least.   Consultants:   VVS  Procedures:   Antimicrobials: Anti-infectives (From admission, onward)   Start     Dose/Rate Route Frequency Ordered Stop   04/08/19 2200  vancomycin (VANCOCIN) IVPB 1000 mg/200 mL premix  Status:  Discontinued     1,000 mg 200 mL/hr over 60 Minutes Intravenous Every 12 hours 04/08/19 0842 04/08/19 1239   04/08/19 1600  ceFEPIme (MAXIPIME) 2 g in sodium chloride 0.9 % 100 mL IVPB  Status:  Discontinued     2 g 200 mL/hr over 30 Minutes Intravenous Every 8 hours 04/08/19 0827 04/08/19 1239   04/08/19 1245  cefTRIAXone (ROCEPHIN) 1 g in sodium chloride 0.9 % 100 mL IVPB     1 g 200 mL/hr over 30 Minutes Intravenous Every 24 hours 04/08/19 1240     04/08/19 1245   azithromycin (ZITHROMAX) 500 mg in sodium chloride 0.9 % 250 mL IVPB     500 mg 250 mL/hr over 60 Minutes Intravenous Every 24 hours 04/08/19 1240     04/08/19 0900  vancomycin (VANCOCIN) 2,000 mg in sodium chloride 0.9 % 500 mL IVPB  Status:  Discontinued     2,000 mg 250 mL/hr over 120 Minutes Intravenous  Once 04/08/19 0842 04/08/19 1239   04/08/19 0800  ceFEPIme (MAXIPIME) 2 g in sodium chloride 0.9 % 100 mL IVPB     2 g 200 mL/hr over 30 Minutes Intravenous  Once 04/08/19 0755 04/08/19 0946   04/08/19 0800  vancomycin (VANCOCIN) IVPB 1000 mg/200 mL premix  Status:  Discontinued     1,000 mg 200 mL/hr over 60 Minutes Intravenous  Once 04/08/19 0755 04/08/19 0842       Objective: Vitals:   04/07/19 1445 04/07/19 1713 04/07/19 2136 04/08/19 0345  BP:  (!) 143/85 (!) 165/91 (!) 155/86  Pulse:  73 71 93  Resp: 17 18 20 20   Temp:   98.6 F (37 C) (!) 101.1 F (38.4 C)  TempSrc:   Oral Axillary  SpO2:  96% 96% 99%  Weight:      Height:        Intake/Output Summary (Last 24 hours) at 04/08/2019 1240 Last data filed at 04/08/2019 1209 Gross per 24 hour  Intake 646.94 ml  Output 500 ml  Net 146.94 ml   Filed Weights   04/07/19 0516  Weight: 113.4 kg    Examination:  Physical Exam:  General: Not in acute distress HEENT: PERRL, EOMI, no scleral icterus, No JVD or bruit Cardiac: S1/S2, RRR, No murmurs, gallops or rubs Pulm: has rhonchi on lung auscultation Abd: Soft, nondistended, has left side abdomen tenderness, no rebound pain, no organomegaly, BS present Ext: No edema. 2+DP/PT pulse bilaterally Musculoskeletal: No joint deformities, erythema, or stiffness, ROM full Skin: No rashes.  Neuro: Alert and oriented X3, cranial nerves II-XII grossly intact, moves all extremeties normally. Pt has dysarthria from previous stroke. Psych: Patient is not psychotic, no suicidal or hemocidal ideation.    Data Reviewed: I have personally reviewed following labs and imaging  studies  CBC: Recent Labs  Lab 04/07/19 0529 04/08/19 0848  WBC 4.5 10.2  HGB 13.2 11.9*  HCT 41.2 36.7*  MCV 78.6* 77.3*  PLT 248 970   Basic Metabolic Panel: Recent Labs  Lab 04/07/19 0529  NA 138  K 3.6  CL 96*  CO2 28  GLUCOSE 168*  BUN 22  CREATININE 1.07  CALCIUM 9.4  MG 1.9  PHOS 3.3   GFR: Estimated Creatinine Clearance: 86 mL/min (by C-G formula based on SCr of 1.07 mg/dL). Liver Function Tests: Recent Labs  Lab 04/07/19 0529  AST 23  ALT 14  ALKPHOS 66  BILITOT 0.8  PROT 8.7*  ALBUMIN 4.2   Recent Labs  Lab 04/07/19 0529  LIPASE 22   No results for input(s): AMMONIA in the last 168 hours. Coagulation Profile: Recent Labs  Lab 04/07/19 1134  INR 1.1   Cardiac Enzymes: No results for input(s): CKTOTAL, CKMB, CKMBINDEX, TROPONINI in the last 168 hours. BNP (last 3 results) No results for input(s): PROBNP in the last 8760 hours. HbA1C: Recent Labs    04/07/19 1452  HGBA1C 6.8*   CBG: Recent Labs  Lab 04/02/19 0628 04/02/19 0925 04/02/19 1031 04/08/19 0749 04/08/19 1153  GLUCAP 103* 117* 82 91 90   Lipid Profile: No results for input(s): CHOL, HDL, LDLCALC, TRIG, CHOLHDL, LDLDIRECT in the last 72 hours. Thyroid Function Tests: Recent Labs    04/07/19 0529  TSH 0.487   Anemia Panel: No results for input(s): VITAMINB12, FOLATE, FERRITIN, TIBC, IRON, RETICCTPCT in the last 72 hours. Sepsis Labs: Recent Labs  Lab 04/08/19 0848 04/08/19 1121  PROCALCITON 0.14  --   LATICACIDVEN 1.5 2.3*    Recent Results (from the past 240 hour(s))  SARS CORONAVIRUS 2 (TAT 6-24 HRS) Nasopharyngeal Nasopharyngeal Swab     Status: None   Collection Time: 04/07/19 11:34 AM   Specimen: Nasopharyngeal Swab  Result Value Ref Range Status   SARS Coronavirus 2 NEGATIVE NEGATIVE Final    Comment: (NOTE) SARS-CoV-2 target nucleic acids are NOT DETECTED. The SARS-CoV-2 RNA is generally detectable in upper and lower respiratory specimens during  the acute phase of infection. Negative results do not preclude SARS-CoV-2 infection, do not rule out co-infections with other pathogens, and should not be used as the sole basis for treatment or other patient management decisions. Negative results must be combined with clinical observations, patient history, and epidemiological information. The expected result  is Negative. Fact Sheet for Patients: SugarRoll.be Fact Sheet for Healthcare Providers: https://www.woods-mathews.com/ This test is not yet approved or cleared by the [place] FDA and  has been authorized for detection and/or diagnosis of SARS-CoV-2 by FDA under an Emergency Use Authorization (EUA). This EUA will remain  in effect (meaning this test can be used) for the duration of the COVID-19 declaration under Section 56 4(b)(1) of the Act, 21 U.S.C. section 360bbb-3(b)(1), unless the authorization is terminated or revoked sooner. Performed at [facility], [place] [address] [place], [place] [address]      RN Pressure Injury Documentation:       Radiology Studies: Ct Abdomen Pelvis W Contrast  Result Date: 04/07/2019 CLINICAL DATA:  Nausea and vomiting.  Abdominal pain. EXAM: CT ABDOMEN AND PELVIS WITH CONTRAST TECHNIQUE: Multidetector CT imaging of the abdomen and pelvis was performed using the standard protocol following bolus administration of intravenous contrast. CONTRAST:  154m OMNIPAQUE IOHEXOL 300 MG/ML  SOLN COMPARISON:  06/22/2018 FINDINGS: Lower chest:  Cardiomegaly.  Stable mild scarring at the bases Hepatobiliary: No focal liver abnormality.No evidence of biliary obstruction or stone. Pancreas: Unremarkable. Spleen: Unremarkable. Adrenals/Urinary Tract: Negative adrenals. Dense non enhancement of a moderate area in the upper and interpolar left kidney compatible with segmental arterial occlusion. The main left renal artery is widely patent. No accessory renal  artery with seen on prior study. Best seen on sagittal reformats there is an associated segmental renal artery occlusion. Mild right lower pole renal cortical scarring. No hydronephrosis. Unremarkable bladder. Stomach/Bowel: No obstruction. No evidence of bowel inflammation including appendicitis Vascular/Lymphatic: Unremarkable appearance of IVC filter. Luminal or mural calcification at the bilateral common femoral vein compatible with sequela of remote DVT. Atherosclerotic calcification. No aortic ulceration or dissection. Left renal artery finding as described. Reproductive:No pathologic findings. Other: No ascites or pneumoperitoneum. Musculoskeletal: Spondylosis with remarkably bulky spurring and multilevel bridging osteophyte. IMPRESSION: 1. 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 No underlying dissection or definite embolic source identified. No evident area of infarction or ischemia in the other viscera. 2. Cardiomegaly 3.  Aortic Atherosclerosis (ICD10-I70.0). 4. Bilateral deep femoral vein calcification correlating with remote DVT. An IVC filter is in place. Electronically Signed   By: JMonte FantasiaM.D.   On: 04/07/2019 09:40   Portable Cxr  Result Date: 04/08/2019 CLINICAL DATA:  Fever EXAM: PORTABLE CHEST 1 VIEW COMPARISON:  Chest radiograph dated 02/04/2018. FINDINGS: The heart is enlarged. There is mild left basilar atelectasis. The right lung is clear. There is no pleural effusion or pneumothorax. Degenerative changes are seen in the thoracic spine and shoulders. IMPRESSION: 1. Mild left basilar atelectasis. 2. Unchanged cardiomegaly. Electronically Signed   By: TZerita BoersM.D.   On: 04/08/2019 12:09        Scheduled Meds: . allopurinol  100 mg Oral BID  . guaiFENesin  600 mg Oral BID   Or  . dextromethorphan  30 mg Oral BID  . gabapentin  400 mg Oral TID  . insulin aspart  0-5 Units Subcutaneous QHS  . insulin aspart  0-9 Units Subcutaneous TID WC  .  metoprolol tartrate  25 mg Oral BID  . senna  1 tablet Oral BID  . simvastatin  20 mg Oral QHS   Continuous Infusions: . sodium chloride    . sodium chloride 100 mL/hr at 04/08/19 0911  . azithromycin    . cefTRIAXone (ROCEPHIN)  IV    . heparin 1,800 Units/hr (04/08/19 0243)  .  sodium chloride       LOS: 1 day    Time spent: 30 min     Ivor Costa, DO [facility] PAGER is on [place]  If 7PM-7AM, please contact night-coverage [email or web address] Password [facility] 04/08/2019, 12:40 PM

## 2019-04-09 LAB — BASIC METABOLIC PANEL
Anion gap: 12 (ref 5–15)
BUN: 20 mg/dL (ref 8–23)
CO2: 22 mmol/L (ref 22–32)
Calcium: 8.4 mg/dL — ABNORMAL LOW (ref 8.9–10.3)
Chloride: 99 mmol/L (ref 98–111)
Creatinine, Ser: 1.09 mg/dL (ref 0.61–1.24)
GFR calc Af Amer: >60 mL/min (ref >60)
GFR calc non Af Amer: >60 mL/min (ref >60)
Glucose, Bld: 118 mg/dL — ABNORMAL HIGH (ref 70–99)
Potassium: 3.4 mmol/L — ABNORMAL LOW (ref 3.5–5.1)
Sodium: 133 mmol/L — ABNORMAL LOW (ref 135–145)

## 2019-04-09 LAB — GLUCOSE, CAPILLARY
Glucose-Capillary: 105 mg/dL — ABNORMAL HIGH (ref 70–99)
Glucose-Capillary: 114 mg/dL — ABNORMAL HIGH (ref 70–99)
Glucose-Capillary: 102 mg/dL — ABNORMAL HIGH (ref 70–99)
Glucose-Capillary: 107 mg/dL — ABNORMAL HIGH (ref 70–99)
Glucose-Capillary: 105 mg/dL — ABNORMAL HIGH (ref 70–99)

## 2019-04-09 LAB — MAGNESIUM: Magnesium: 1.8 mg/dL (ref 1.7–2.4)

## 2019-04-09 LAB — CBC
HCT: 35.1 % — ABNORMAL LOW (ref 39.0–52.0)
Hemoglobin: 11.5 g/dL — ABNORMAL LOW (ref 13.0–17.0)
MCH: 24.8 pg — ABNORMAL LOW (ref 26.0–34.0)
MCHC: 32.8 g/dL (ref 30.0–36.0)
MCV: 75.6 fL — ABNORMAL LOW (ref 80.0–100.0)
Platelets: 193 10*3/uL (ref 150–400)
RBC: 4.64 MIL/uL (ref 4.22–5.81)
RDW: 16 % — ABNORMAL HIGH (ref 11.5–15.5)
WBC: 12.7 10*3/uL — ABNORMAL HIGH (ref 4.0–10.5)
nRBC: 0 % (ref 0.0–0.2)

## 2019-04-09 LAB — HEPARIN LEVEL (UNFRACTIONATED): Heparin Unfractionated: 0.11 IU/mL — ABNORMAL LOW (ref 0.30–0.70)

## 2019-04-09 LAB — APTT: aPTT: 71 seconds — ABNORMAL HIGH (ref 24–36)

## 2019-04-09 MED ORDER — POTASSIUM CHLORIDE CRYS ER 20 MEQ PO TBCR
40.0000 meq | EXTENDED_RELEASE_TABLET | Freq: Once | ORAL | Status: AC
  Administered 2019-04-09: 40 meq via ORAL
  Filled 2019-04-09: qty 2

## 2019-04-09 MED ORDER — HYDRALAZINE HCL 25 MG PO TABS
25.0000 mg | ORAL_TABLET | Freq: Four times a day (QID) | ORAL | Status: DC | PRN
  Administered 2019-04-09 – 2019-04-10 (×2): 25 mg via ORAL
  Filled 2019-04-09 (×2): qty 1

## 2019-04-09 NOTE — Consult Note (Signed)
ANTICOAGULATION CONSULT NOTE   Pharmacy Consult for Heparin Indication: DVT  No Known Allergies  Patient Measurements: Height: 6\' 2"  (188 cm) Weight: 250 lb (113.4 kg) IBW/kg (Calculated) : 82.2 Heparin Dosing Weight: 105.9 kg  Vital Signs: Temp: 100.2 F (37.9 C) (11/22 2035) Temp Source: Oral (11/22 2035) BP: 124/99 (11/22 2035) Pulse Rate: 97 (11/22 2035)  Labs: Recent Labs    04/07/19 0529 04/07/19 1134 04/07/19 1452  04/08/19 0848 04/08/19 1701 04/09/19 0127  HGB 13.2  --   --   --  11.9*  --  11.5*  HCT 41.2  --   --   --  36.7*  --  35.1*  PLT 248  --   --   --  206  --  193  APTT  --  27  --    < > 71* 69* 71*  LABPROT  --  13.9  --   --   --   --   --   INR  --  1.1  --   --   --   --   --   HEPARINUNFRC  --   --  1.32*  --   --  0.30 0.11*  CREATININE 1.07  --   --   --   --   --  1.09   < > = values in this interval not displayed.    Estimated Creatinine Clearance: 84.5 mL/min (by C-G formula based on SCr of 1.09 mg/dL).   Medical History: Past Medical History:  Diagnosis Date  . Arthritis   . Atrial fibrillation ([place])   . Cocaine abuse ([place])    + UDS on admission  . Diabetes mellitus without complication ([place])   . Gout current and history of  . History of deep vein thrombosis 2006  . History of tobacco abuse    has smoked for 50 years  . Hypertension   . Pulmonary embolism ([place]) 2010  . Stroke [facility]) 2017   affected speech and right side-uses cane    Medications:  Apixaban on prior to admission medication list. Out-patient prescription appears expired since 11/10. Per RN last dose was yesterday.   Assessment: Patient is a 68 y/o M with medical history of DVT, PE, CVA, gout, diabetes, hypertension who presented to [facility] ED 11/21 c/o abdominal pain. He reports eating something bad and several episodes of non-black non-bloody emesis. 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 CT abdomen pelvis w contrast  impression of left renal artery occlusion and bilateral deep vein calcification correlating with remote DVT with IVC filter in place. Pharmacy has been consulted for heparin drip for VTE treatment. Baseline aPTT and INR within normal limits. Hgb and plt within normal limits.   Goal of Therapy:  Heparin level 0.3-0.7 units/ml aPTT 66-102 seconds Monitor platelets by anticoagulation protocol: Yes   Plan:  11/23 @ 0130 aPTT 71 seconds therapeutic, HL 0.11 subtherapeutic still not correlating. Will continue to dose off aPTT and continue current rate and will recheck aPTT/HL w/ am labs, CBC trended down but stable will continue to monitor.  Tobie Lords, [facility] Clinical Pharmacist 04/09/2019,4:45 AM

## 2019-04-09 NOTE — Progress Notes (Addendum)
PROGRESS NOTE    [name]  DJS:[id number] DOB: Jul 11, 1950 DOA: 04/07/2019 PCP: Theotis Burrow, MD    Brief Narrative:    Iktan [name] is a 68 y.o. male with medical history significant of atrial fibrillation, diabetes mellitus, hypertension seen in the emergency room for left-sided abdominal pain 10 out of 10 started last night while at rest at home.  Patient states the pain is new onset he has never had similar episode in the past.  He has not used any substances in the past 9 years.  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  His left arm is in a bandage he has recently had surgery olecranon bursectomy that was done on November 16 of this year.  Patient also reports that he had nausea and has had vomiting 3 times since yesterday because of this abdominal pain he denies any changes in bowel movements or bowel habits, patient denies any hematochezia or melena.  Patient also denies any dysuria or hematuria.  Patient also has a history of stent in his leg, patient has an IVC filter.  Family history is significant for aneurysm in the mother, diabetes in his father.  Home meds reviewed consist of Eliquis 5 mg twice daily, meloxicam, Metformin, metoprolol, tramadol.  ED Course:  Blood pressure (!) 174/102, pulse 72, temperature 98 F (36.7 C), temperature source Oral, resp. rate 17, height 6' 2"  (1.88 m), weight 113.4 kg, SpO2 96 %. Initial CBC shows hemoglobin of 13.2, hematocrit of 41, however MCV 78.6, RDW 16, platelet count of 248. Compressive metabolic panel shows a normal sodium of 138, potassium of 3.6, blood glucose of 168, serum creatinine of 1.07, EGFR of 60, AST of 23, ALT of 14.    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 No underlying dissection or definite embolic source identified. No evident area of  infarction or ischemia in the other viscera. 2. Cardiomegaly 3.  Aortic Atherosclerosis (ICD10-I70.0). 4. Bilateral deep femoral vein calcification correlating with remote DVT. An IVC filter is in place.  Interim History: - 11/22: pt developed fever or 101.1, lactic acid 1.5 --> 2.3, unclear source of infection, started Vanco and cefepime - 11/22 CXR pending  Subjective: Patient has mild left-sided abdominal pain and left flank pain. Denies nausea, vomiting or diarrhea. No fever or chills today. Patient has productive cough, and mild SOB, no chest pain.    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 Lorenso Courier was consulted, suspecting embolic event with history of AFIB, DVTs, and is off anticoagulation for his left elbow surgery last week. Dr. Lorenso Courier recommend conservative management at this time: -continue IV heparin -pain control: prn home Norco  History of DVT/PE: was on Eliquis, which is on hold due to left elbow surgery last week.  -Patient to be restarted on Eliquis at discharge  History of CVA with dysarthria: Continue Eliquis therapy at discharge -Statin  Diabetes mellitus without complication: Y8F 6.8  on admission. Pt is taking Metformin at home -SSI in hosptial  Hypertension: Blood pressure 155/86 -Hold Lasix due to sepsis - losartan -Continue metoprolol -As needed hydralazine  S/of left elbow surgery:  -monitoring  Gout: -continue home allopurinol  Chronic Atrial Fibrillation: CHA2DS2-VASc Score is 5 , needs oral anticoagulation. Patient was on Eliquis at  home. Heart rate is  90-110.  -continue metoprolol -will resume Eliquis at discharge  Fever and sepsis: Source of infection is not clear.  Patient had fever of 101.1.  WBC 4.5.  He meets criteria for sepsis with fever, tachycardia.  Lactic acid is elevated 1.5, 2.3.  Source of infection is not clear.  UA negative. Pt has cough and mild shortness of breath.  Bronchitis and pneumonia differential diagnosis. CXR showed mild left basilar atelectasis and unchanged cardiomegaly, no infiltration. Covid 19 negative. Pt may have acute bronchitis. No fever today. -started vancomycin and cefepime before CXR is done --> narrowed down to rocephin and azithromycin -Blood culture, sputum culture -will get Procalcitonin and trend lactic acid levels per sepsis protocol. -IVF: 1.5 L of NS bolus in ED, followed by 100 cc/h -->75 cc/h on 11/23    DVT prophylaxis: on IV heparin Code Status: full Family Communication: no, family not at bedside Disposition Plan: not ready for discharge. Pt still need IV heparin. Developed and fever and sepsis with unclear source of infection. Will need to be treated in hospital for another 1 or 2 days at least.   Consultants:   VVS  Procedures:   Antimicrobials: Anti-infectives (From admission, onward)   Start     Dose/Rate Route Frequency Ordered Stop   04/08/19 2200  vancomycin (VANCOCIN) IVPB 1000 mg/200 mL premix  Status:  Discontinued     1,000 mg 200 mL/hr over 60 Minutes Intravenous Every 12 hours 04/08/19 0842 04/08/19 1239   04/08/19 1800  azithromycin (ZITHROMAX) 500 mg in sodium chloride 0.9 % 250 mL IVPB     500 mg 250 mL/hr over 60 Minutes Intravenous Daily-1800 04/08/19 1240     04/08/19 1600  ceFEPIme (MAXIPIME) 2 g in sodium chloride 0.9 % 100 mL IVPB  Status:  Discontinued     2 g 200 mL/hr over 30 Minutes Intravenous Every 8 hours 04/08/19 0827 04/08/19 1239   04/08/19 1600  cefTRIAXone (ROCEPHIN) 1 g in sodium chloride 0.9 % 100 mL IVPB     1 g 200  mL/hr over 30 Minutes Intravenous Daily-1800 04/08/19 1240     04/08/19 0900  vancomycin (VANCOCIN) 2,000 mg in sodium chloride 0.9 % 500 mL IVPB  Status:  Discontinued     2,000 mg 250 mL/hr over 120 Minutes Intravenous  Once 04/08/19 0842 04/08/19 1239   04/08/19 0800  ceFEPIme (MAXIPIME) 2 g in sodium chloride 0.9 % 100 mL IVPB     2 g 200 mL/hr over 30 Minutes Intravenous  Once 04/08/19 0755 04/08/19 0946   04/08/19 0800  vancomycin (VANCOCIN) IVPB 1000 mg/200 mL premix  Status:  Discontinued     1,000 mg 200 mL/hr over 60 Minutes Intravenous  Once 04/08/19 0755 04/08/19 0842       Objective: Vitals:   04/08/19 1705 04/08/19 2033 04/08/19 2035 04/09/19 0453  BP: (!) 167/110 (!) 124/99 (!) 124/99 (!) 161/99  Pulse: 96  97 99  Resp:   18 20  Temp:   100.2 F (37.9 C) 98.7 F (37.1 C)  TempSrc:   Oral Oral  SpO2:   98% 98%  Weight:  Height:        Intake/Output Summary (Last 24 hours) at 04/09/2019 0841 Last data filed at 04/09/2019 0600 Gross per 24 hour  Intake 3370.65 ml  Output 1000 ml  Net 2370.65 ml   Filed Weights   04/07/19 0516  Weight: 113.4 kg    Examination:  Physical Exam:  General: Not in acute distress HEENT: PERRL, EOMI, no scleral icterus, No JVD or bruit Cardiac: S1/S2, RRR, No murmurs, gallops or rubs Pulm: has rhonchi on lung auscultation Abd: Soft, nondistended, has left side abdomen tenderness, no rebound pain, no organomegaly, BS present Ext: No edema. 2+DP/PT pulse bilaterally Musculoskeletal: No joint deformities, erythema, or stiffness, ROM full Skin: No rashes.  Neuro: Alert and oriented X3, cranial nerves II-XII grossly intact, moves all extremeties normally. Pt has dysarthria from previous stroke. Psych: Patient is not psychotic, no suicidal or hemocidal ideation.    Data Reviewed: I have personally reviewed following labs and imaging studies  CBC: Recent Labs  Lab 04/07/19 0529 04/08/19 0848 04/09/19 0127  WBC 4.5  10.2 12.7*  HGB 13.2 11.9* 11.5*  HCT 41.2 36.7* 35.1*  MCV 78.6* 77.3* 75.6*  PLT 248 206 366   Basic Metabolic Panel: Recent Labs  Lab 04/07/19 0529 04/09/19 0127  NA 138 133*  K 3.6 3.4*  CL 96* 99  CO2 28 22  GLUCOSE 168* 118*  BUN 22 20  CREATININE 1.07 1.09  CALCIUM 9.4 8.4*  MG 1.9 1.8  PHOS 3.3  --    GFR: Estimated Creatinine Clearance: 84.5 mL/min (by C-G formula based on SCr of 1.09 mg/dL). Liver Function Tests: Recent Labs  Lab 04/07/19 0529  AST 23  ALT 14  ALKPHOS 66  BILITOT 0.8  PROT 8.7*  ALBUMIN 4.2   Recent Labs  Lab 04/07/19 0529  LIPASE 22   No results for input(s): AMMONIA in the last 168 hours. Coagulation Profile: Recent Labs  Lab 04/07/19 1134  INR 1.1   Cardiac Enzymes: No results for input(s): CKTOTAL, CKMB, CKMBINDEX, TROPONINI in the last 168 hours. BNP (last 3 results) No results for input(s): PROBNP in the last 8760 hours. HbA1C: Recent Labs    04/07/19 1452  HGBA1C 6.8*   CBG: Recent Labs  Lab 04/08/19 0749 04/08/19 1153 04/08/19 1700 04/08/19 2125 04/09/19 0808  GLUCAP 91 90 98 118* 105*   Lipid Profile: No results for input(s): CHOL, HDL, LDLCALC, TRIG, CHOLHDL, LDLDIRECT in the last 72 hours. Thyroid Function Tests: Recent Labs    04/07/19 0529  TSH 0.487   Anemia Panel: No results for input(s): VITAMINB12, FOLATE, FERRITIN, TIBC, IRON, RETICCTPCT in the last 72 hours. Sepsis Labs: Recent Labs  Lab 04/08/19 0848 04/08/19 1121  PROCALCITON 0.14  --   LATICACIDVEN 1.5 2.3*    Recent Results (from the past 240 hour(s))  SARS CORONAVIRUS 2 (TAT 6-24 HRS) Nasopharyngeal Nasopharyngeal Swab     Status: None   Collection Time: 04/07/19 11:34 AM   Specimen: Nasopharyngeal Swab  Result Value Ref Range Status   SARS Coronavirus 2 NEGATIVE NEGATIVE Final    Comment: (NOTE) SARS-CoV-2 target nucleic acids are NOT DETECTED. The SARS-CoV-2 RNA is generally detectable in upper and lower respiratory  specimens during the acute phase of infection. Negative results do not preclude SARS-CoV-2 infection, do not rule out co-infections with other pathogens, and should not be used as the sole basis for treatment or other patient management decisions. Negative results must be combined with clinical observations, patient history, and epidemiological information.  The expected result is Negative. Fact Sheet for Patients: SugarRoll.be Fact Sheet for Healthcare Providers: https://www.woods-mathews.com/ This test is not yet approved or cleared by the [place] FDA and  has been authorized for detection and/or diagnosis of SARS-CoV-2 by FDA under an Emergency Use Authorization (EUA). This EUA will remain  in effect (meaning this test can be used) for the duration of the COVID-19 declaration under Section 56 4(b)(1) of the Act, 21 U.S.C. section 360bbb-3(b)(1), unless the authorization is terminated or revoked sooner. Performed at [facility], [place] [address] [place], [place] [address]   Culture, expectorated sputum-assessment     Status: None   Collection Time: 04/08/19  7:26 PM   Specimen: Expectorated Sputum  Result Value Ref Range Status   Specimen Description EXPECTORATED SPUTUM  Final   Special Requests NONE  Final   Sputum evaluation   Final    Sputum specimen not acceptable for testing.  Please recollect.   RESULT CALLED TO, READ BACK BY AND VERIFIED WITH: DAWN SONGSTER ON 04/08/2019 AT 2108 TIK Performed at [facility], [place] [place],  [address]    Report Status 04/08/2019 FINAL  Final     RN Pressure Injury Documentation:       Radiology Studies: Ct Abdomen Pelvis W Contrast  Result Date: 04/07/2019 CLINICAL DATA:  Nausea and vomiting.  Abdominal pain. EXAM: CT ABDOMEN AND PELVIS WITH CONTRAST TECHNIQUE: Multidetector CT imaging of the abdomen and pelvis was performed using the standard  protocol following bolus administration of intravenous contrast. CONTRAST:  148m OMNIPAQUE IOHEXOL 300 MG/ML  SOLN COMPARISON:  06/22/2018 FINDINGS: Lower chest:  Cardiomegaly.  Stable mild scarring at the bases Hepatobiliary: No focal liver abnormality.No evidence of biliary obstruction or stone. Pancreas: Unremarkable. Spleen: Unremarkable. Adrenals/Urinary Tract: Negative adrenals. Dense non enhancement of a moderate area in the upper and interpolar left kidney compatible with segmental arterial occlusion. The main left renal artery is widely patent. No accessory renal artery with seen on prior study. Best seen on sagittal reformats there is an associated segmental renal artery occlusion. Mild right lower pole renal cortical scarring. No hydronephrosis. Unremarkable bladder. Stomach/Bowel: No obstruction. No evidence of bowel inflammation including appendicitis Vascular/Lymphatic: Unremarkable appearance of IVC filter. Luminal or mural calcification at the bilateral common femoral vein compatible with sequela of remote DVT. Atherosclerotic calcification. No aortic ulceration or dissection. Left renal artery finding as described. Reproductive:No pathologic findings. Other: No ascites or pneumoperitoneum. Musculoskeletal: Spondylosis with remarkably bulky spurring and multilevel bridging osteophyte. IMPRESSION: 1. 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 No underlying dissection or definite embolic source identified. No evident area of infarction or ischemia in the other viscera. 2. Cardiomegaly 3.  Aortic Atherosclerosis (ICD10-I70.0). 4. Bilateral deep femoral vein calcification correlating with remote DVT. An IVC filter is in place. Electronically Signed   By: JMonte FantasiaM.D.   On: 04/07/2019 09:40   Portable Cxr  Result Date: 04/08/2019 CLINICAL DATA:  Fever EXAM: PORTABLE CHEST 1 VIEW COMPARISON:  Chest radiograph dated 02/04/2018. FINDINGS: The heart is enlarged. There is  mild left basilar atelectasis. The right lung is clear. There is no pleural effusion or pneumothorax. Degenerative changes are seen in the thoracic spine and shoulders. IMPRESSION: 1. Mild left basilar atelectasis. 2. Unchanged cardiomegaly. Electronically Signed   By: TZerita BoersM.D.   On: 04/08/2019 12:09        Scheduled Meds: . allopurinol  100 mg Oral BID  . dextromethorphan  30  mg Oral BID  . gabapentin  400 mg Oral TID  . guaiFENesin  600 mg Oral BID  . insulin aspart  0-5 Units Subcutaneous QHS  . insulin aspart  0-9 Units Subcutaneous TID WC  . losartan  50 mg Oral BH-q7a  . metoprolol tartrate  25 mg Oral BID  . potassium chloride  40 mEq Oral Once  . senna  1 tablet Oral BID  . simvastatin  20 mg Oral QHS   Continuous Infusions: . sodium chloride Stopped (04/08/19 1831)  . sodium chloride 100 mL/hr at 04/08/19 2023  . azithromycin 500 mg (04/08/19 2113)  . cefTRIAXone (ROCEPHIN)  IV 1 g (04/08/19 2027)  . heparin 1,850 Units/hr (04/08/19 2015)     LOS: 2 days    Time spent: 30 min     Ivor Costa, DO [facility] PAGER is on [place]  If 7PM-7AM, please contact night-coverage [email or web address] Password [facility] 04/09/2019, 8:41 AM

## 2019-04-09 NOTE — Progress Notes (Signed)
PT Cancellation Note  Patient Details Name: [name] MRN: [id number] DOB: April 19, 1951   Cancelled Treatment:    Reason Eval/Treat Not Completed: Patient not medically ready(Chart reviewed, MD consulted. Pt being followed by pharmacy for anticoagulation, but with renal arterial thrombus/occlusion. Awaiting clearance from MD prior to iniitating evaluation.)  1:24 PM, 04/09/19 Etta Grandchild, PT, DPT Physical Therapist - [facility]  [phone] ([place])    [place] C 04/09/2019, 1:24 PM

## 2019-04-10 LAB — BASIC METABOLIC PANEL
Anion gap: 9 (ref 5–15)
BUN: 19 mg/dL (ref 8–23)
CO2: 21 mmol/L — ABNORMAL LOW (ref 22–32)
Calcium: 8.2 mg/dL — ABNORMAL LOW (ref 8.9–10.3)
Chloride: 104 mmol/L (ref 98–111)
Creatinine, Ser: 1.09 mg/dL (ref 0.61–1.24)
GFR calc Af Amer: >60 mL/min (ref >60)
GFR calc non Af Amer: >60 mL/min (ref >60)
Glucose, Bld: 121 mg/dL — ABNORMAL HIGH (ref 70–99)
Potassium: 3.9 mmol/L (ref 3.5–5.1)
Sodium: 134 mmol/L — ABNORMAL LOW (ref 135–145)

## 2019-04-10 LAB — CBC
HCT: 31.3 % — ABNORMAL LOW (ref 39.0–52.0)
Hemoglobin: 10.7 g/dL — ABNORMAL LOW (ref 13.0–17.0)
MCH: 24.9 pg — ABNORMAL LOW (ref 26.0–34.0)
MCHC: 34.2 g/dL (ref 30.0–36.0)
MCV: 72.8 fL — ABNORMAL LOW (ref 80.0–100.0)
Platelets: 150 10*3/uL (ref 150–400)
RBC: 4.3 MIL/uL (ref 4.22–5.81)
RDW: 16.2 % — ABNORMAL HIGH (ref 11.5–15.5)
WBC: 10.7 10*3/uL — ABNORMAL HIGH (ref 4.0–10.5)
nRBC: 0 % (ref 0.0–0.2)

## 2019-04-10 LAB — LACTIC ACID, PLASMA: Lactic Acid, Venous: 0.9 mmol/L (ref 0.5–1.9)

## 2019-04-10 LAB — APTT: aPTT: 69 seconds — ABNORMAL HIGH (ref 24–36)

## 2019-04-10 LAB — GLUCOSE, CAPILLARY
Glucose-Capillary: 120 mg/dL — ABNORMAL HIGH (ref 70–99)
Glucose-Capillary: 92 mg/dL (ref 70–99)
Glucose-Capillary: 139 mg/dL — ABNORMAL HIGH (ref 70–99)
Glucose-Capillary: 104 mg/dL — ABNORMAL HIGH (ref 70–99)

## 2019-04-10 LAB — HEPARIN LEVEL (UNFRACTIONATED): Heparin Unfractionated: 0.16 IU/mL — ABNORMAL LOW (ref 0.30–0.70)

## 2019-04-10 MED ORDER — APIXABAN 5 MG PO TABS
5.0000 mg | ORAL_TABLET | Freq: Two times a day (BID) | ORAL | Status: DC
  Administered 2019-04-10 – 2019-04-11 (×2): 5 mg via ORAL
  Filled 2019-04-10 (×2): qty 1

## 2019-04-10 MED ORDER — CYCLOBENZAPRINE HCL 10 MG PO TABS
5.0000 mg | ORAL_TABLET | Freq: Three times a day (TID) | ORAL | Status: DC | PRN
  Administered 2019-04-10: 5 mg via ORAL
  Filled 2019-04-10: qty 1

## 2019-04-10 NOTE — Progress Notes (Signed)
PROGRESS NOTE    [name]  RFF:[id number] DOB: 1951/02/20 DOA: 04/07/2019 PCP: Theotis Burrow, MD    Brief Narrative:    [name] is a 68 y.o. male with medical history significant of atrial fibrillation, diabetes mellitus, hypertension seen in the emergency room for left-sided abdominal pain 10 out of 10 started last night while at rest at home.  Patient states the pain is new onset he has never had similar episode in the past.  He has not used any substances in the past 9 years.  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  His left arm is in a bandage he has recently had surgery olecranon bursectomy that was done on November 16 of this year.  Patient also reports that he had nausea and has had vomiting 3 times since yesterday because of this abdominal pain he denies any changes in bowel movements or bowel habits, patient denies any hematochezia or melena.  Patient also denies any dysuria or hematuria.  Patient also has a history of stent in his leg, patient has an IVC filter.  Family history is significant for aneurysm in the mother, diabetes in his father.  Home meds reviewed consist of Eliquis 5 mg twice daily, meloxicam, Metformin, metoprolol, tramadol.  ED Course:  Blood pressure (!) 174/102, pulse 72, temperature 98 F (36.7 C), temperature source Oral, resp. rate 17, height 6' 2"  (1.88 m), weight 113.4 kg, SpO2 96 %. Initial CBC shows hemoglobin of 13.2, hematocrit of 41, however MCV 78.6, RDW 16, platelet count of 248. Compressive metabolic panel shows a normal sodium of 138, potassium of 3.6, blood glucose of 168, serum creatinine of 1.07, EGFR of 60, AST of 23, ALT of 14.    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 No underlying dissection or definite embolic source identified. No evident area of  infarction or ischemia in the other viscera. 2. Cardiomegaly 3.  Aortic Atherosclerosis (ICD10-I70.0). 4. Bilateral deep femoral vein calcification correlating with remote DVT. An IVC filter is in place.  Interim History: - 11/22: pt developed fever or 101.1, lactic acid 1.5 --> 2.3, unclear source of infection, started Vanco and cefepime - 11/22 CXR pending  Subjective: Patient has mild left-sided abdominal pain, no left flank pain. Denies nausea, vomiting or diarrhea. No fever or chills today. Patient has mild nonproductive cough, and mild SOB, no chest pain. No fever or chill.   Assessment & Plan:   Principal Problem:   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HCC) Active Problems:   Diabetes mellitus without complication (HCC)   HTN (hypertension), malignant   DVT (deep venous thrombosis) (HCC)   CVA, old, speech/language deficit   Essential (primary) hypertension   Gout   Pulmonary embolism (HCC)   Abdominal pain   Atrial fibrillation, chronic (HCC)   Fever   Sepsis ([place])  Acute left renal artery occlusion/large renal infarct: VVS, Dr. Lorenso Courier was consulted, suspecting embolic event with history of AFIB, DVTs, and is off anticoagulation for his left elbow surgery last week. Dr. Lorenso Courier recommend conservative management at this time: -continue IV heparin -->will change to Eliquis at discharge -pain control: prn home Norco  History of DVT/PE: was on Eliquis, which is on hold due to left elbow surgery last week.  -Patient to be restarted on Eliquis at discharge  History of CVA with dysarthria: Continue Eliquis therapy  at discharge -Statin  Diabetes mellitus without complication: P2R 6.8 on admission. Pt is taking Metformin at home -SSI in hosptial  Hypertension: Blood pressure 155/86 -Hold Lasix due to sepsis - losartan -Continue metoprolol -As needed hydralazine  S/of left elbow surgery:  -monitoring  Gout: -continue home allopurinol  Chronic Atrial Fibrillation: CHA2DS2-VASc  Score is 5 , needs oral anticoagulation. Patient was on Eliquis at home. Heart rate is  90-110.  -continue metoprolol -will resume Eliquis at discharge  Fever and sepsis: Source of infection is not clear.  Patient had fever of 101.1.  WBC 4.5.  He meets criteria for sepsis with fever, tachycardia.  Lactic acid is elevated 1.5, 2.3.  Source of infection is not clear.  UA negative. Pt has cough and mild shortness of breath.  Bronchitis and pneumonia differential diagnosis. CXR showed mild left basilar atelectasis and unchanged cardiomegaly, no infiltration. Covid 19 negative. Pt may have acute bronchitis. No fever today. But still has mild leukocytosis. Lactic acid normalized 11/24. -started vancomycin and cefepime before CXR is done --> narrowed down to rocephin and azithromycin -Blood culture -->so far no growth, sputum culture -will get Procalcitonin and trend lactic acid levels per sepsis protocol. -IVF: 1.5 L of NS bolus in ED, followed by 100 cc/h -->75 cc/h on 11/23 -->d/c IVF 11/24  S/p of OLECRANON BURSA AND TOPHUS EXCISION LEFT ELBOW on 11/16:  -consulted ortho, Dr. Sabra Heck for possible removal of splint   DVT prophylaxis: on IV heparin Code Status: full Family Communication: no, family not at bedside Disposition Plan: not ready for discharge. Pt still need IV heparin, still has leukocytosis. Likely d/c on 11/25   Consultants:   VVS  Procedures:   Antimicrobials: Anti-infectives (From admission, onward)   Start     Dose/Rate Route Frequency Ordered Stop   04/08/19 2200  vancomycin (VANCOCIN) IVPB 1000 mg/200 mL premix  Status:  Discontinued     1,000 mg 200 mL/hr over 60 Minutes Intravenous Every 12 hours 04/08/19 0842 04/08/19 1239   04/08/19 1800  azithromycin (ZITHROMAX) 500 mg in sodium chloride 0.9 % 250 mL IVPB     500 mg 250 mL/hr over 60 Minutes Intravenous Daily-1800 04/08/19 1240     04/08/19 1600  ceFEPIme (MAXIPIME) 2 g in sodium chloride 0.9 % 100 mL IVPB   Status:  Discontinued     2 g 200 mL/hr over 30 Minutes Intravenous Every 8 hours 04/08/19 0827 04/08/19 1239   04/08/19 1600  cefTRIAXone (ROCEPHIN) 1 g in sodium chloride 0.9 % 100 mL IVPB     1 g 200 mL/hr over 30 Minutes Intravenous Daily-1800 04/08/19 1240     04/08/19 0900  vancomycin (VANCOCIN) 2,000 mg in sodium chloride 0.9 % 500 mL IVPB  Status:  Discontinued     2,000 mg 250 mL/hr over 120 Minutes Intravenous  Once 04/08/19 0842 04/08/19 1239   04/08/19 0800  ceFEPIme (MAXIPIME) 2 g in sodium chloride 0.9 % 100 mL IVPB     2 g 200 mL/hr over 30 Minutes Intravenous  Once 04/08/19 0755 04/08/19 0946   04/08/19 0800  vancomycin (VANCOCIN) IVPB 1000 mg/200 mL premix  Status:  Discontinued     1,000 mg 200 mL/hr over 60 Minutes Intravenous  Once 04/08/19 0755 04/08/19 0842       Objective: Vitals:   04/09/19 1149 04/09/19 1959 04/10/19 0010 04/10/19 0415  BP: (!) 148/90 (!) 153/97  (!) 142/95  Pulse: (!) 102 94 96 96  Resp: 17 20  20  Temp: 99.5 F (37.5 C) 98.7 F (37.1 C)  99.1 F (37.3 C)  TempSrc: Oral Oral  Oral  SpO2: 99% 99% 97% 98%  Weight:    105.2 kg  Height:        Intake/Output Summary (Last 24 hours) at 04/10/2019 0741 Last data filed at 04/10/2019 1761 Gross per 24 hour  Intake 2619.03 ml  Output 1325 ml  Net 1294.03 ml   Filed Weights   04/07/19 0516 04/10/19 0415  Weight: 113.4 kg 105.2 kg    Examination:  Physical Exam:  General: Not in acute distress HEENT: PERRL, EOMI, no scleral icterus, No JVD or bruit Cardiac: S1/S2, RRR, No murmurs, gallops or rubs Pulm: has rhonchi on lung auscultation Abd: Soft, nondistended, has left side abdomen tenderness, no rebound pain, no organomegaly, BS present Ext: No edema. 2+DP/PT pulse bilaterally Musculoskeletal: No joint deformities, erythema, or stiffness, ROM full Skin: No rashes.  Neuro: Alert and oriented X3, cranial nerves II-XII grossly intact, moves all extremeties normally. Pt has  dysarthria from previous stroke. Psych: Patient is not psychotic, no suicidal or hemocidal ideation.    Data Reviewed: I have personally reviewed following labs and imaging studies  CBC: Recent Labs  Lab 04/07/19 0529 04/08/19 0848 04/09/19 0127 04/10/19 0454  WBC 4.5 10.2 12.7* 10.7*  HGB 13.2 11.9* 11.5* 10.7*  HCT 41.2 36.7* 35.1* 31.3*  MCV 78.6* 77.3* 75.6* 72.8*  PLT 248 206 193 607   Basic Metabolic Panel: Recent Labs  Lab 04/07/19 0529 04/09/19 0127 04/10/19 0454  NA 138 133* 134*  K 3.6 3.4* 3.9  CL 96* 99 104  CO2 28 22 21*  GLUCOSE 168* 118* 121*  BUN 22 20 19   CREATININE 1.07 1.09 1.09  CALCIUM 9.4 8.4* 8.2*  MG 1.9 1.8  --   PHOS 3.3  --   --    GFR: Estimated Creatinine Clearance: 81.5 mL/min (by C-G formula based on SCr of 1.09 mg/dL). Liver Function Tests: Recent Labs  Lab 04/07/19 0529  AST 23  ALT 14  ALKPHOS 66  BILITOT 0.8  PROT 8.7*  ALBUMIN 4.2   Recent Labs  Lab 04/07/19 0529  LIPASE 22   No results for input(s): AMMONIA in the last 168 hours. Coagulation Profile: Recent Labs  Lab 04/07/19 1134  INR 1.1   Cardiac Enzymes: No results for input(s): CKTOTAL, CKMB, CKMBINDEX, TROPONINI in the last 168 hours. BNP (last 3 results) No results for input(s): PROBNP in the last 8760 hours. HbA1C: Recent Labs    04/07/19 1452  HGBA1C 6.8*   CBG: Recent Labs  Lab 04/09/19 0808 04/09/19 1150 04/09/19 1705 04/09/19 2127 04/09/19 2130  GLUCAP 105* 114* 102* 107* 105*   Lipid Profile: No results for input(s): CHOL, HDL, LDLCALC, TRIG, CHOLHDL, LDLDIRECT in the last 72 hours. Thyroid Function Tests: No results for input(s): TSH, T4TOTAL, FREET4, T3FREE, THYROIDAB in the last 72 hours. Anemia Panel: No results for input(s): VITAMINB12, FOLATE, FERRITIN, TIBC, IRON, RETICCTPCT in the last 72 hours. Sepsis Labs: Recent Labs  Lab 04/08/19 0848 04/08/19 1121 04/10/19 0454  PROCALCITON 0.14  --   --   LATICACIDVEN 1.5 2.3*  0.9    Recent Results (from the past 240 hour(s))  SARS CORONAVIRUS 2 (TAT 6-24 HRS) Nasopharyngeal Nasopharyngeal Swab     Status: None   Collection Time: 04/07/19 11:34 AM   Specimen: Nasopharyngeal Swab  Result Value Ref Range Status   SARS Coronavirus 2 NEGATIVE NEGATIVE Final  Comment: (NOTE) SARS-CoV-2 target nucleic acids are NOT DETECTED. The SARS-CoV-2 RNA is generally detectable in upper and lower respiratory specimens during the acute phase of infection. Negative results do not preclude SARS-CoV-2 infection, do not rule out co-infections with other pathogens, and should not be used as the sole basis for treatment or other patient management decisions. Negative results must be combined with clinical observations, patient history, and epidemiological information. The expected result is Negative. Fact Sheet for Patients: SugarRoll.be Fact Sheet for Healthcare Providers: https://www.woods-mathews.com/ This test is not yet approved or cleared by the [place] FDA and  has been authorized for detection and/or diagnosis of SARS-CoV-2 by FDA under an Emergency Use Authorization (EUA). This EUA will remain  in effect (meaning this test can be used) for the duration of the COVID-19 declaration under Section 56 4(b)(1) of the Act, 21 U.S.C. section 360bbb-3(b)(1), unless the authorization is terminated or revoked sooner. Performed at [facility], [place] [address] [place], [place] [address]   CULTURE, BLOOD (ROUTINE X 2) w Reflex to ID Panel     Status: None (Preliminary result)   Collection Time: 04/08/19  8:48 AM   Specimen: BLOOD  Result Value Ref Range Status   Specimen Description BLOOD RIGHT ANTECUBITAL  Final   Special Requests   Final    BOTTLES DRAWN AEROBIC AND ANAEROBIC Blood Culture adequate volume   Culture   Final    NO GROWTH 1 DAY Performed at [facility], [address], [place], [place]  [address]    Report Status PENDING  Incomplete  CULTURE, BLOOD (ROUTINE X 2) w Reflex to ID Panel     Status: None (Preliminary result)   Collection Time: 04/08/19  8:48 AM   Specimen: BLOOD  Result Value Ref Range Status   Specimen Description BLOOD BLOOD RIGHT HAND  Final   Special Requests   Final    BOTTLES DRAWN AEROBIC AND ANAEROBIC Blood Culture results may not be optimal due to an inadequate volume of blood received in culture bottles   Culture   Final    NO GROWTH 1 DAY Performed at [facility], [address], [place], [place] [address]    Report Status PENDING  Incomplete  Culture, expectorated sputum-assessment     Status: None   Collection Time: 04/08/19  7:26 PM   Specimen: Expectorated Sputum  Result Value Ref Range Status   Specimen Description EXPECTORATED SPUTUM  Final   Special Requests NONE  Final   Sputum evaluation   Final    Sputum specimen not acceptable for testing.  Please recollect.   RESULT CALLED TO, READ BACK BY AND VERIFIED WITH: DAWN SONGSTER ON 04/08/2019 AT 2108 TIK Performed at [facility], [address] [place], [place] [address]    Report Status 04/08/2019 FINAL  Final        Radiology Studies: Portable Cxr  Result Date: 04/08/2019 CLINICAL DATA:  Fever EXAM: PORTABLE CHEST 1 VIEW COMPARISON:  Chest radiograph dated 02/04/2018. FINDINGS: The heart is enlarged. There is mild left basilar atelectasis. The right lung is clear. There is no pleural effusion or pneumothorax. Degenerative changes are seen in the thoracic spine and shoulders. IMPRESSION: 1. Mild left basilar atelectasis. 2. Unchanged cardiomegaly. Electronically Signed   By: Zerita Boers M.D.   On: 04/08/2019 12:09        Scheduled Meds: . allopurinol  100 mg Oral BID  . dextromethorphan  30 mg Oral BID  . gabapentin  400 mg Oral TID  . guaiFENesin  600 mg Oral BID  . insulin aspart  0-5 Units Subcutaneous QHS  . insulin aspart  0-9 Units Subcutaneous  TID WC  . losartan  50 mg Oral [id number]  . metoprolol tartrate  25 mg Oral BID  . senna  1 tablet Oral BID  . simvastatin  20 mg Oral QHS   Continuous Infusions: . sodium chloride Stopped (04/08/19 1831)  . sodium chloride 75 mL/hr at 04/10/19 0628  . azithromycin Stopped (04/09/19 1925)  . cefTRIAXone (ROCEPHIN)  IV Stopped (04/09/19 1804)  . heparin 1,850 Units/hr (04/10/19 2561)     LOS: 3 days    Time spent: 30 min     Ivor Costa, DO [facility] PAGER is on [place]  If 7PM-7AM, please contact night-coverage [email or web address] Password [facility] 04/10/2019, 7:41 AM

## 2019-04-10 NOTE — Evaluation (Signed)
Occupational Therapy Evaluation Patient Details Name: [name] MRN: [id number] DOB: 02-13-1951 Today's Date: 04/10/2019    History of Present Illness [name] is a 68 y.o. male with medical history significant of atrial fibrillation, diabetes mellitus, hypertension seen in the emergency room for left-sided abdominal pain 10 out of 10 started last night while at rest at home, Patient also reports that he had nausea and has had vomiting 3 times since yesterday because of this abdominal pain he denies any changes in bowel movements. 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   Clinical Impression   [name] was seen for OT evaluation this date. Prior to hospital admission, pt reports he was active and independent. He reports he likes to stay active and does not like to "sit at home".  He is independent in all ADL/IADL tasks, driving, and ambulating within the community using a SPC. Pt lives alone in a 1-level apartment home with no steps to enter. He states his girlfriend is around frequently and could be available 24/7 if needed. Currently pt demonstrates impairments in strength and coordination in BLEs, as well as over activity tolerance and functional LUE use 2/2 recent L elbow surgery which functionally limits his his ability to perform self-care and ADL tasks. Pt currently requires minimal assistance for LB ADL management. Pt would benefit from skilled OT services to address noted impairments and functional limitations (see below for any additional details) in order to maximize safety and independence while minimizing falls risk and caregiver burden.  Upon hospital discharge, recommend HHOT to maximize pt safety and functional independence during meaningful occupations of daily life.      Follow Up Recommendations  Home health OT    Equipment Recommendations  3 in 1 bedside commode    Recommendations for Other Services       Precautions /  Restrictions Precautions Precautions: Fall Restrictions Weight Bearing Restrictions: No      Mobility Bed Mobility Overal bed mobility: Needs Assistance Bed Mobility: Sit to Supine     Supine to sit: Supervision Sit to supine: Supervision   General bed mobility comments: Pt slow and laborious with bed mobility, but otherwise no need for physical assistance. Seated on EOB t/o majority of OT evaluation.  Transfers Overall transfer level: Needs assistance Equipment used: Straight cane Transfers: Sit to/from Stand Sit to Stand: Min assist;Min guard         General transfer comment: VC to avoid LUE for bearing down as it is still splinted, pt has no clear weight bearing status at this time.    Balance Overall balance assessment: Needs assistance Sitting-balance support: Feet supported;No upper extremity supported Sitting balance-Leahy Scale: Good Sitting balance - Comments: Pt steady static and dynamic sitting at EOB. No LOB noted with weight shift when reaching to feet to adjust socks this date.   Standing balance support: Single extremity supported;During functional activity Standing balance-Leahy Scale: Poor                             ADL either performed or assessed with clinical judgement   ADL                                         General ADL Comments: Pt currently requires min assist for ADL management including LB dressing and bathing. Pt noted  to require increased effort to reach his foot to adjust sock this date. Pt interested in learning about AE to support independence and safety during ADLs at home.     Vision Baseline Vision/History: No visual deficits Patient Visual Report: No change from baseline       Perception     Praxis      Pertinent Vitals/Pain Pain Assessment: Faces Pain Score: 10-Worst pain ever Faces Pain Scale: Hurts even more Pain Location: BLE - Pt requesting "gout pills" Pain Descriptors / Indicators:  Aching;Constant;Cramping;Grimacing Pain Intervention(s): Limited activity within patient's tolerance;Patient requesting pain meds-RN notified;RN gave pain meds during session;Repositioned     Hand Dominance Right   Extremity/Trunk Assessment Upper Extremity Assessment Upper Extremity Assessment: Generalized weakness;LUE deficits/detail(BUE grossly weak with limited AROM, particularly at the shoulder.) LUE Deficits / Details: Recent L elbow surgery (8 days prior to OT eval) with RN in room to remove bandages/posterior splint during session. Per primary RN pt is cleared for LUE use as tolerated by MD. 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 RN in room during session to remove ace wrapping and posterior splint.    Exercises Other Exercises Other Exercises: Pt educated on falls prevention strategies, safe use of AE/DME for ADL management, and energy conservation stategies this date. Would benefit from opportunity to trial AE for LB ADL management.   Shoulder Instructions      Home Living Family/patient expects to be discharged to:: Private residence Living Arrangements: Alone Available Help at Discharge: Friend(s);Available 24 hours/day(Pt reports his girlfriend is available if needed.) Type of Home: Apartment Home Access: Level entry      Home Layout: One level     Bathroom Shower/Tub: Occupational psychologist: Standard     Home Equipment: Cane - single point          Prior Functioning/Environment Level of Independence: Independent with assistive device(s)        Comments: Pt reports he is active and independent at baseline. Manages all BADL tasks independenty, drives, and shops. He states his girlfriend assists with cooking and cleaning. Endorses a few near miss falls onto his bed, but no significant falls in the past year.        OT Problem List: Decreased strength;Decreased coordination;Decreased range of motion;Impaired sensation;Decreased activity tolerance;Decreased safety awareness;Decreased knowledge of use of DME or AE;Impaired UE functional use      OT Treatment/Interventions: Self-care/ADL training;Therapeutic exercise;Therapeutic activities;Energy conservation;DME and/or AE instruction;Balance training;Patient/family education    OT Goals(Current goals can be found in the care plan section) Acute Rehab OT Goals Patient Stated Goal: Regain mobility per baseline, be rid of LLE pain OT Goal Formulation: With patient Time For Goal Achievement: 04/24/19 Potential to Achieve Goals: Good ADL Goals Pt Will Perform Lower Body Bathing: with modified independence;sitting/lateral leans;with adaptive equipment(With LRAD PRN for  improved safety and functional independence) Pt Will Perform Lower Body Dressing: with adaptive equipment;sit to/from stand;with modified independence(With LRAD PRN for improved safety and functional independence) Additional ADL Goal #1: Pt will independently verbalize a plan to implement at least 3 learned falls prevention strategies into his daily routines/home environment for improved safety and functional independence upon hospital DC.  OT Frequency: Min 1X/week   Barriers to D/C:            Co-evaluation              AM-PAC OT "6 Clicks" Daily Activity      Outcome Measure Help from another person eating meals?: None Help from another person taking care of personal grooming?: None Help from another person toileting, which includes using toliet, bedpan, or urinal?: A Little Help from another person bathing (including washing, rinsing, drying)?: A Little Help from another person to put on and taking off regular upper body clothing?: A Little Help from another person to put on and taking off regular lower body clothing?: A Little 6 Click Score: 20   End of Session Nurse Communication: Patient requests pain meds  Activity Tolerance: Patient tolerated treatment well Patient left: in bed;with call bell/phone within reach(Pt recieved with bed alarm off. Requests it remain off at end of OT session.)  OT Visit Diagnosis: Muscle weakness (generalized) (M62.81);Other abnormalities of gait and mobility (R26.89)                Time: 1431-1501 OT Time Calculation (min): 30 min Charges:  OT General Charges $OT Visit: 1 Visit OT Evaluation $OT Eval Moderate Complexity: 1 Mod OT Treatments $Self Care/Home Management : 8-22 mins  Shara Blazing, M.S., OTR/L Ascom: [phone] 04/10/19, 3:47 PM

## 2019-04-10 NOTE — Evaluation (Signed)
Physical Therapy Evaluation Patient Details Name: [name] MRN: [id number] DOB: [birth year] Today's Date: 04/10/2019   History of Present Illness  [name] is a 68 y.o. male with medical history significant of atrial fibrillation, diabetes mellitus, hypertension seen in the emergency room for left-sided abdominal pain 10 out of 10 started last night while at rest at home, Patient also reports that he had nausea and has had vomiting 3 times since yesterday because of this abdominal pain he denies any changes in bowel movements. 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  Clinical Impression  Pt admitted with above diagnosis. Pt currently with functional limitations due to the deficits listed below (see "PT Problem List"). Upon entry, pt in bed, awake and agreeable to participate. The pt is alert and oriented x3, pleasant, conversational, and generally a fair historian, often vague in detail. Pt is weak with labored performance to EOB and apparent weakness with transition to feet at bedside. AMB is limited by gait instability and pain in Left calf area related to a cramping phenomenon. Pt requires minA physical assist for safety with transfers and short household distance AMB. Functional mobility assessment demonstrates increased effort/time requirements, poor tolerance, and need for physical assistance, whereas the patient performed these at a higher level of independence PTA. Pt will benefit from skilled PT intervention to increase independence and safety with basic mobility in preparation for discharge to the venue listed below.      Follow Up Recommendations Home health PT    Equipment Recommendations  Rolling walker with 5" wheels;Other (comment)(unclear if permitted to use, still awaiting weight bearing status.)    Recommendations for Other Services       Precautions / Restrictions Precautions Precautions: Fall Restrictions Weight Bearing  Restrictions: No      Mobility  Bed Mobility Overal bed mobility: Needs Assistance Bed Mobility: Supine to Sit     Supine to sit: Supervision     General bed mobility comments: labored and delayed, but otherwise no need for physical assistance  Transfers Overall transfer level: Needs assistance Equipment used: Straight cane Transfers: Sit to/from Stand Sit to Stand: Min assist;Min guard         General transfer comment: VC to avoid LUE for bearing down as it is still splinted, pt has no clear weight bearing status at this time.  Ambulation/Gait Ambulation/Gait assistance: Min guard Gait Distance (Feet): 40 Feet Assistive device: Straight cane Gait Pattern/deviations: Step-to pattern;Antalgic;Staggering left;Staggering right;Drifts right/left     General Gait Details: very small steps, uses SPC but still unsure about footing, uses LUE to stabilize self despite cues to avoid. AMB to BR and back to recliner.  Stairs            Wheelchair Mobility    Modified Rankin (Stroke Patients Only)       Balance Overall balance assessment: Needs assistance Sitting-balance support: Single extremity supported;Feet supported Sitting balance-Leahy Scale: Fair     Standing balance support: Single extremity supported;During functional activity Standing balance-Leahy Scale: Poor                               Pertinent Vitals/Pain Pain Assessment: Faces Faces Pain Scale: Hurts even more Pain Location: Left foreleg Pain Descriptors / Indicators: Aching;Cramping Pain Intervention(s): Limited activity within patient's tolerance;Monitored during session;Repositioned    Home Living Family/patient expects to be discharged to:: Private residence Living Arrangements: Alone Available Help at Discharge: Friend(s)  Type of Home: House Home Access: Level entry     Home Layout: One level Home Equipment: Cane - single point      Prior Function Level of  Independence: Independent with assistive device(s)               Hand Dominance   Dominant Hand: Right    Extremity/Trunk Assessment   Upper Extremity Assessment Upper Extremity Assessment: Overall WFL for tasks assessed;Generalized weakness;LUE deficits/detail LUE Deficits / Details: recent Left elbow surgeyr 8DA, still splinted LUE: Unable to fully assess due to immobilization    Lower Extremity Assessment Lower Extremity Assessment: Generalized weakness;Overall [facility] for tasks assessed    Cervical / Trunk Assessment Cervical / Trunk Assessment: Normal  Communication   Communication: (some intermittent slurred speech/word finiding issues simiilar to prior episodes.)  Cognition Arousal/Alertness: Awake/alert Behavior During Therapy: WFL for tasks assessed/performed Overall Cognitive Status: Within Functional Limits for tasks assessed                                        General Comments      Exercises     Assessment/Plan    PT Assessment Patient needs continued PT services  PT Problem List Decreased balance;Decreased activity tolerance;Decreased strength;Decreased mobility;Decreased coordination       PT Treatment Interventions DME instruction;Gait training;Balance training;Functional mobility training;Stair training;Therapeutic activities;Therapeutic exercise;Patient/family education    PT Goals (Current goals can be found in the Care Plan section)  Acute Rehab PT Goals Patient Stated Goal: Regain mobility per baseline, b erid of LLE pain PT Goal Formulation: With patient Time For Goal Achievement: 04/24/19 Potential to Achieve Goals: Fair    Frequency Min 2X/week   Barriers to discharge Decreased caregiver support      Co-evaluation               AM-PAC PT "6 Clicks" Mobility  Outcome Measure Help needed turning from your back to your side while in a flat bed without using bedrails?: A Little Help needed moving from lying on  your back to sitting on the side of a flat bed without using bedrails?: A Little Help needed moving to and from a bed to a chair (including a wheelchair)?: A Little Help needed standing up from a chair using your arms (e.g., wheelchair or bedside chair)?: A Little Help needed to walk in hospital room?: A Little Help needed climbing 3-5 steps with a railing? : A Little 6 Click Score: 18    End of Session Equipment Utilized During Treatment: Gait belt Activity Tolerance: Patient limited by pain;Patient tolerated treatment well Patient left: in chair;with call bell/phone within reach Nurse Communication: Mobility status PT Visit Diagnosis: Unsteadiness on feet (R26.81);Difficulty in walking, not elsewhere classified (R26.2);Other abnormalities of gait and mobility (R26.89);Muscle weakness (generalized) (M62.81)    Time: 9030-0923 PT Time Calculation (min) (ACUTE ONLY): 25 min   Charges:   PT Evaluation $PT Eval Moderate Complexity: 1 Mod PT Treatments $Therapeutic Exercise: 8-22 mins        2:49 PM, 04/10/19 Etta Grandchild, PT, DPT Physical Therapist - [facility]  [phone] ([place])    Buccola,Allan C 04/10/2019, 2:48 PM

## 2019-04-10 NOTE — Consult Note (Signed)
ANTICOAGULATION CONSULT NOTE   Pharmacy Consult for Heparin Indication: DVT  No Known Allergies  Patient Measurements: Height: 6\' 2"  (188 cm) Weight: 250 lb (113.4 kg) IBW/kg (Calculated) : 82.2 Heparin Dosing Weight: 105.9 kg  Vital Signs: Temp: 99.1 F (37.3 C) (11/24 0415) Temp Source: Oral (11/24 0415) BP: 142/95 (11/24 0415) Pulse Rate: 96 (11/24 0415)  Labs: Recent Labs    04/07/19 1134  04/08/19 0848 04/08/19 1701 04/09/19 0127 04/10/19 0454  HGB  --    < > 11.9*  --  11.5* 10.7*  HCT  --   --  36.7*  --  35.1* 31.3*  PLT  --   --  206  --  193 150  APTT 27   < > 71* 69* 71* 69*  LABPROT 13.9  --   --   --   --   --   INR 1.1  --   --   --   --   --   HEPARINUNFRC  --    < >  --  0.30 0.11* 0.16*  CREATININE  --   --   --   --  1.09 1.09   < > = values in this interval not displayed.    Estimated Creatinine Clearance: 84.5 mL/min (by C-G formula based on SCr of 1.09 mg/dL).   Medical History: Past Medical History:  Diagnosis Date  . Arthritis   . Atrial fibrillation ([place])   . Cocaine abuse ([place])    + UDS on admission  . Diabetes mellitus without complication ([place])   . Gout current and history of  . History of deep vein thrombosis 2006  . History of tobacco abuse    has smoked for 50 years  . Hypertension   . Pulmonary embolism ([place]) 2010  . Stroke [facility]) 2017   affected speech and right side-uses cane    Medications:  Apixaban on prior to admission medication list. Out-patient prescription appears expired since 11/10. Per RN last dose was yesterday.   Assessment: Patient is a 68 y/o M with medical history of DVT, PE, CVA, gout, diabetes, hypertension who presented to [facility] ED 11/21 c/o abdominal pain. He reports eating something bad and several episodes of non-black non-bloody emesis. 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 CT abdomen pelvis w contrast impression of left renal artery occlusion and bilateral deep  vein calcification correlating with remote DVT with IVC filter in place. Pharmacy has been consulted for heparin drip for VTE treatment. Baseline aPTT and INR within normal limits. Hgb and plt within normal limits.   Goal of Therapy:  Heparin level 0.3-0.7 units/ml aPTT 66-102 seconds Monitor platelets by anticoagulation protocol: Yes   Plan:  11/23 @ 0130 aPTT 71 seconds therapeutic, HL 0.11 subtherapeutic still not correlating. Will continue to dose off aPTT and continue current rate and will recheck aPTT/HL w/ am labs, CBC trended down but stable will continue to monitor.  11/24 @ 0454:   APTT = 69,  HL = 0.16 HL still subtherapeutic but aPTT is within range.  Will continue pt on current rate and recheck HL and aPTT tomorrow with AM labs.   Lisvet Rasheed D, [facility] Clinical Pharmacist 04/10/2019,5:57 AM

## 2019-04-10 NOTE — Care Management Important Message (Signed)
Important Message  Patient Details  Name: [name] MRN: [id number] Date of Birth: 10-09-1950   Medicare Important Message Given:  Yes     [name] 04/10/2019, 11:31 AM

## 2019-04-11 DIAGNOSIS — M79662 Pain in left lower leg: Secondary | ICD-10-CM

## 2019-04-11 LAB — CBC
HCT: 31.7 % — ABNORMAL LOW (ref 39.0–52.0)
Hemoglobin: 10.9 g/dL — ABNORMAL LOW (ref 13.0–17.0)
MCH: 24.9 pg — ABNORMAL LOW (ref 26.0–34.0)
MCHC: 34.4 g/dL (ref 30.0–36.0)
MCV: 72.4 fL — ABNORMAL LOW (ref 80.0–100.0)
Platelets: 177 10*3/uL (ref 150–400)
RBC: 4.38 MIL/uL (ref 4.22–5.81)
RDW: 16 % — ABNORMAL HIGH (ref 11.5–15.5)
WBC: 8.3 10*3/uL (ref 4.0–10.5)
nRBC: 0 % (ref 0.0–0.2)

## 2019-04-11 LAB — BASIC METABOLIC PANEL
Anion gap: 12 (ref 5–15)
BUN: 22 mg/dL (ref 8–23)
CO2: 20 mmol/L — ABNORMAL LOW (ref 22–32)
Calcium: 8.5 mg/dL — ABNORMAL LOW (ref 8.9–10.3)
Chloride: 103 mmol/L (ref 98–111)
Creatinine, Ser: 1.15 mg/dL (ref 0.61–1.24)
GFR calc Af Amer: >60 mL/min (ref >60)
GFR calc non Af Amer: >60 mL/min (ref >60)
Glucose, Bld: 111 mg/dL — ABNORMAL HIGH (ref 70–99)
Potassium: 3.6 mmol/L (ref 3.5–5.1)
Sodium: 135 mmol/L (ref 135–145)

## 2019-04-11 LAB — GLUCOSE, CAPILLARY
Glucose-Capillary: 92 mg/dL (ref 70–99)
Glucose-Capillary: 92 mg/dL (ref 70–99)

## 2019-04-11 MED ORDER — CYCLOBENZAPRINE HCL 5 MG PO TABS: 5.0000 mg | ORAL_TABLET | Freq: Three times a day (TID) | ORAL | 0 refills | Status: DC | PRN

## 2019-04-11 MED ORDER — POLYETHYLENE GLYCOL 3350 17 G PO PACK: 17.0000 g | PACK | Freq: Every day | ORAL | 0 refills | Status: DC | PRN

## 2019-04-11 MED ORDER — AZITHROMYCIN 250 MG PO TABS: ORAL_TABLET | ORAL | 0 refills | Status: DC

## 2019-04-11 MED ORDER — APIXABAN 5 MG PO TABS: 5.0000 mg | ORAL_TABLET | Freq: Two times a day (BID) | ORAL | 2 refills | Status: DC

## 2019-04-11 MED ORDER — ASPIRIN EC 81 MG PO TBEC: 81.0000 mg | DELAYED_RELEASE_TABLET | Freq: Every day | ORAL | 1 refills | Status: AC

## 2019-04-11 MED ORDER — APIXABAN 5 MG PO TABS: 5.0000 mg | ORAL_TABLET | Freq: Two times a day (BID) | ORAL | 0 refills | Status: DC

## 2019-04-11 MED ORDER — GUAIFENESIN ER 600 MG PO TB12: 600.0000 mg | ORAL_TABLET | Freq: Two times a day (BID) | ORAL | 0 refills | Status: AC

## 2019-04-11 MED ORDER — SENNA 8.6 MG PO TABS: 1.0000 | ORAL_TABLET | Freq: Two times a day (BID) | ORAL | 0 refills | Status: DC

## 2019-04-11 MED ORDER — APIXABAN 5 MG PO TABS: 10.0000 mg | ORAL_TABLET | Freq: Two times a day (BID) | ORAL | 0 refills | Status: DC

## 2019-04-11 NOTE — Progress Notes (Signed)
Patient became confused as the night progressed and had to be redirected back to his room because he was walking into another patient's room. I had to reorient him to the fact that he was in the hospital and that he may go home during the day because he was putting on his clothes and took off the cardiac monitor like he was leaving. I encouraged the patient to get back in bed and turned off his lights so that he could go to sleep. Will continue to monitor.  Christene Slates

## 2019-04-11 NOTE — Discharge Summary (Signed)
Physician Discharge Summary  [name] GUR:[id number] DOB: [birth year] DOA: 04/07/2019  PCP: Theotis Burrow, MD  Admit date: 04/07/2019 Discharge date: 04/11/2019  Recommendations for Outpatient Follow-up:  1. Follow up with PCP in 5 days and vascular surgeon 2. Please obtain BMP/CBC in one week 3. Please follow up Blood culture and sputum culture   Home Health: HH PT, HH OT,  Equipment/Devices: Rolling walker 5"  Discharge Condition: stable CODE STATUS: full  Diet recommendation: heart/carb modified diet  Brief/Interim Summary (HPI)   [name] is a 68 y.o. male with medical history significant of atrial fibrillation, diabetes mellitus, hypertension seen in the emergency room for left-sided abdominal pain 10 out of 10 started last night while at rest at home.  Patient states the pain is new onset he has never had similar episode in the past.  He has not used any substances in the past 9 years.  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  His left arm is in a bandage he has recently had surgery olecranon bursectomy that was done on November 16 of this year.  Patient also reports that he had nausea and has had vomiting 3 times since yesterday because of this abdominal pain he denies any changes in bowel movements or bowel habits, patient denies any hematochezia or melena.  Patient also denies any dysuria or hematuria.  Patient also has a history of stent in his leg, patient has an IVC filter.  Family history is significant for aneurysm in the mother, diabetes in his father.  Home meds reviewed consist of Eliquis 5 mg twice daily, meloxicam, Metformin, metoprolol, tramadol.  ED Course:  Blood pressure (!) 174/102, pulse 72, temperature 98 F (36.7 C), temperature source Oral, resp. rate 17, height 6' 2"  (1.88 m), weight 113.4 kg, SpO2 96 %. Initial CBC shows hemoglobin of 13.2, hematocrit of 41, however MCV  78.6, RDW 16, platelet count of 248. Compressive metabolic panel shows a normal sodium of 138, potassium of 3.6, blood glucose of 168, serum creatinine of 1.07, EGFR of 60, AST of 23, ALT of 14.  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 No underlying dissection or definite embolic source identified. No evident area of infarction or ischemia in the other viscera. 2. Cardiomegaly 3.  Aortic Atherosclerosis (ICD10-I70.0). 4. Bilateral deep femoral vein calcification correlating with remote DVT. An IVC filter is in place.   Subjective  Pt still has mild left side abdominal pain, no flank pain.  He complains sulfa left leg pain from knee down to the ankle.  No nausea, vomiting, diarrhea.  No fever or chills.  Denies chest pain.  He has some mild dry cough and mild shortness of breath.  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 Lorenso Courier was consulted, suspecting embolic event with history of AFIB, DVTs, and he was off anticoagulation Eliquis for his left elbow surgery since  11/16. Dr. Lorenso Courier recommend conservative management in hospital. Pt was treated with IV Heparin. His left flank pain has resolved.  He still has some mild left-sided abdominal pain.  This morning 11/25, patient complains of right leg pain, d from the knee down to the ankle.   Due the concerning of risk of occlusive PAD, I consulted [facility] again. Dr. Delana Meyer evaluated pt. Per Dr. Delana Meyer, "I  believe I feel a week dorsalis pedis pulse bilaterally" and "I believe he is at his baseline.  I would continue Eliquis with a baby aspirin.  I have requested that he follow-up with me in the office where noninvasive studies and ultrasound evaluations of his arterial system can be obtained", and "I would continue Eliquis with a baby aspirin" per Dr. Carroll Kinds.  -Pt was treated with IV heparin in hospital -->will change to Eliquis at discharge. Pt will instructed to take 10 mg of Eliquis for 7 days and then start taking his 5 mg of Eliquis twice per day. Our pharmacist, Velora Mediate will kindly help to provide 10 mg Eliquis for two days, and he will help to get piror authorization of 10 mg of Eliquis prescription to patient's outpatient pharmacy. - He will need to follow with [facility] in 7 days. - will also be on ASA 81 mg daily per Dr. Delana Meyer  History of DVT/PE: was on Eliquis, which is on hold due to recent left elbow surgery -Patient to be restarted on Eliquis at discharge  History of CVA with dysarthria: -Continue Eliquis therapy at discharge -Statin - ASA 81 mg daily  Diabetes mellitus without complication: Z6X 6.8 on admission.  -Pt is taking Metformin at home  Hypertension: -Hold Lasix due to sepsis -continue home meds  Gout: -continue home allopurinol  Chronic Atrial Fibrillation: CHA2DS2-VASc Score is 5 , needs oral anticoagulation. Patient was on Eliquis at home. Heart rate is  90-110.  -continue metoprolol -will resume Eliquis at discharge  Fever and sepsis: Source of infection is not clear.  Patient had fever of 101.1.  WBC 4.5.  He meets criteria for sepsis with fever, tachycardia.  Lactic acid is elevated 1.5, 2.3.  Source of infection is not clear.  UA negative. Pt has cough and mild shortness of breath.  Bronchitis is potential differential diagnosis. CXR showed mild left basilar atelectasis and unchanged cardiomegaly, no infiltration. Covid 19 negative. Pt may have acute  bronchitis. No fever and leukocytosis. Lactic acid normalized 11/24. -started vancomycin and cefepime before CXR is done --> narrowed down to rocephin and azithromycin --> will discharge on azithromycin for 2 more day (total of 5 days of antibitics) -Blood culture -->so far no growth, sputum culture -will get Procalcitonin and trend lactic acid levels per sepsis protocol. -IVF: 1.5 L of NS bolus in ED, followed by 100 cc/h -->75 cc/h on 11/23 -->d/c IVF 11/24  S/p of OLECRANON BURSA AND TOPHUS EXCISION LEFT ELBOW on 11/16:  -consulted ortho, Dr. Sabra Heck, splint is removed   Discharge Instructions   Allergies as of 04/11/2019   No Known Allergies     Medication List    STOP taking these medications   meloxicam 15 MG tablet Commonly known as: MOBIC   potassium chloride 10 MEQ tablet Commonly known as: KLOR-CON     TAKE these medications   allopurinol 100 MG tablet Commonly known as: ZYLOPRIM Take 100 mg by mouth 2 (two) times daily.   apixaban 5 MG Tabs tablet Commonly known as: Eliquis Take 2 tablets (10 mg total) by  mouth 2 (two) times daily for 7 days. What changed: how much to take   apixaban 5 MG Tabs tablet Commonly known as: Eliquis Take 1 tablet (5 mg total) by mouth 2 (two) times daily. Start taking on: April 18, 2019 What changed: You were already taking a medication with the same name, and this prescription was added. Make sure you understand how and when to take each.   aspirin EC 81 MG tablet Take 1 tablet (81 mg total) by mouth daily.   azithromycin 250 MG tablet Commonly known as: Zithromax Please take 250 mg per day for two days   cyclobenzaprine 5 MG tablet Commonly known as: FLEXERIL Take 1 tablet (5 mg total) by mouth 3 (three) times daily as needed for muscle spasms.   furosemide 40 MG tablet Commonly known as: LASIX Take 1 tablet (40 mg total) by mouth daily for 30 days. What changed: when to take this   gabapentin 400 MG  capsule Commonly known as: Neurontin Take 1 capsule (400 mg total) by mouth 3 (three) times daily.   guaiFENesin 600 MG 12 hr tablet Commonly known as: MUCINEX Take 1 tablet (600 mg total) by mouth 2 (two) times daily for 7 days.   HYDROcodone-acetaminophen 5-325 MG tablet Commonly known as: Norco Take 1-2 tablets by mouth every 6 (six) hours as needed.   losartan 50 MG tablet Commonly known as: COZAAR Take 50 mg by mouth every morning.   metFORMIN 500 MG tablet Commonly known as: GLUCOPHAGE Take 500 mg by mouth 2 (two) times daily with a meal.   metoprolol tartrate 25 MG tablet Commonly known as: LOPRESSOR Take 25 mg by mouth 2 (two) times daily.   polyethylene glycol 17 g packet Commonly known as: MIRALAX / GLYCOLAX Take 17 g by mouth daily as needed for mild constipation.   senna 8.6 MG Tabs tablet Commonly known as: SENOKOT Take 1 tablet (8.6 mg total) by mouth 2 (two) times daily.   simvastatin 20 MG tablet Commonly known as: ZOCOR Take 20 mg by mouth at bedtime.      Follow-up Information    Revelo, Elyse Jarvis, MD Follow up in 5 day(s).   Specialty: Family Medicine Contact information: [address] [place] [address] [phone]        Delana Meyer, Dolores Lory, MD Follow up in 1 week(s).   Specialties: Vascular Surgery, Cardiology, Radiology, Vascular Surgery Why: with an ABI and bilateral arterial duplex  Can be with Eulogio Ditch  double book if needed Contact information: [address] [address] [phone]          No Known Allergies  Consultations:  [facility]   Procedures/Studies: Ct Abdomen Pelvis W Contrast  Result Date: 04/07/2019 CLINICAL DATA:  Nausea and vomiting.  Abdominal pain. EXAM: CT ABDOMEN AND PELVIS WITH CONTRAST TECHNIQUE: Multidetector CT imaging of the abdomen and pelvis was performed using the standard protocol following bolus administration of intravenous contrast. CONTRAST:  131m OMNIPAQUE  IOHEXOL 300 MG/ML  SOLN COMPARISON:  06/22/2018 FINDINGS: Lower chest:  Cardiomegaly.  Stable mild scarring at the bases Hepatobiliary: No focal liver abnormality.No evidence of biliary obstruction or stone. Pancreas: Unremarkable. Spleen: Unremarkable. Adrenals/Urinary Tract: Negative adrenals. Dense non enhancement of a moderate area in the upper and interpolar left kidney compatible with segmental arterial occlusion. The main left renal artery is widely patent. No accessory renal artery with seen on prior study. Best seen on sagittal reformats there is an associated segmental renal artery occlusion.  Mild right lower pole renal cortical scarring. No hydronephrosis. Unremarkable bladder. Stomach/Bowel: No obstruction. No evidence of bowel inflammation including appendicitis Vascular/Lymphatic: Unremarkable appearance of IVC filter. Luminal or mural calcification at the bilateral common femoral vein compatible with sequela of remote DVT. Atherosclerotic calcification. No aortic ulceration or dissection. Left renal artery finding as described. Reproductive:No pathologic findings. Other: No ascites or pneumoperitoneum. Musculoskeletal: Spondylosis with remarkably bulky spurring and multilevel bridging osteophyte. IMPRESSION: 1. 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 No underlying dissection or definite embolic source identified. No evident area of infarction or ischemia in the other viscera. 2. Cardiomegaly 3.  Aortic Atherosclerosis (ICD10-I70.0). 4. Bilateral deep femoral vein calcification correlating with remote DVT. An IVC filter is in place. Electronically Signed   By: Monte Fantasia M.D.   On: 04/07/2019 09:40   Portable Cxr  Result Date: 04/08/2019 CLINICAL DATA:  Fever EXAM: PORTABLE CHEST 1 VIEW COMPARISON:  Chest radiograph dated 02/04/2018. FINDINGS: The heart is enlarged. There is mild left basilar atelectasis. The right lung is clear. There is no pleural effusion or  pneumothorax. Degenerative changes are seen in the thoracic spine and shoulders. IMPRESSION: 1. Mild left basilar atelectasis. 2. Unchanged cardiomegaly. Electronically Signed   By: Zerita Boers M.D.   On: 04/08/2019 12:09      Discharge Exam: Vitals:   04/11/19 0831 04/11/19 1141  BP: (!) 140/91 (!) 131/91  Pulse: 62 82  Resp:  20  Temp:  98 F (36.7 C)  SpO2:  100%   Vitals:   04/11/19 0448 04/11/19 0500 04/11/19 0831 04/11/19 1141  BP: (!) 144/88  (!) 140/91 (!) 131/91  Pulse: 98  62 82  Resp: 20   20  Temp: 98.3 F (36.8 C)   98 F (36.7 C)  TempSrc: Oral   Oral  SpO2: 99%   100%  Weight:  104.8 kg    Height:        General: Pt is alert, awake, not in acute distress Cardiovascular: RRR, S1/S2 +, no rubs, no gallops Respiratory: CTA bilaterally, no wheezing, no rhonchi Abdominal: Soft, NT, ND, bowel sounds + Extremities:  Has left leg tenderness and bilateral leg swelling.    The results of significant diagnostics from this hospitalization (including imaging, microbiology, ancillary and laboratory) are listed below for reference.     Microbiology: Recent Results (from the past 240 hour(s))  SARS CORONAVIRUS 2 (TAT 6-24 HRS) Nasopharyngeal Nasopharyngeal Swab     Status: None   Collection Time: 04/07/19 11:34 AM   Specimen: Nasopharyngeal Swab  Result Value Ref Range Status   SARS Coronavirus 2 NEGATIVE NEGATIVE Final    Comment: (NOTE) SARS-CoV-2 target nucleic acids are NOT DETECTED. The SARS-CoV-2 RNA is generally detectable in upper and lower respiratory specimens during the acute phase of infection. Negative results do not preclude SARS-CoV-2 infection, do not rule out co-infections with other pathogens, and should not be used as the sole basis for treatment or other patient management decisions. Negative results must be combined with clinical observations, patient history, and epidemiological information. The expected result is Negative. Fact Sheet for  Patients: SugarRoll.be Fact Sheet for Healthcare Providers: https://www.woods-mathews.com/ This test is not yet approved or cleared by the [place] FDA and  has been authorized for detection and/or diagnosis of SARS-CoV-2 by FDA under an Emergency Use Authorization (EUA). This EUA will remain  in effect (meaning this test can be used) for the duration of the COVID-19 declaration under Section 56 4(b)(1) of the  Act, 21 U.S.C. section 360bbb-3(b)(1), unless the authorization is terminated or revoked sooner. Performed at [facility], [place] [address] [place], [place] [address]   CULTURE, BLOOD (ROUTINE X 2) w Reflex to ID Panel     Status: None (Preliminary result)   Collection Time: 04/08/19  8:48 AM   Specimen: BLOOD  Result Value Ref Range Status   Specimen Description BLOOD RIGHT ANTECUBITAL  Final   Special Requests   Final    BOTTLES DRAWN AEROBIC AND ANAEROBIC Blood Culture adequate volume   Culture   Final    NO GROWTH 3 DAYS Performed at [facility], [address], [place], [place] [address]    Report Status PENDING  Incomplete  CULTURE, BLOOD (ROUTINE X 2) w Reflex to ID Panel     Status: None (Preliminary result)   Collection Time: 04/08/19  8:48 AM   Specimen: BLOOD  Result Value Ref Range Status   Specimen Description BLOOD BLOOD RIGHT HAND  Final   Special Requests   Final    BOTTLES DRAWN AEROBIC AND ANAEROBIC Blood Culture results may not be optimal due to an inadequate volume of blood received in culture bottles   Culture   Final    NO GROWTH 3 DAYS Performed at [facility], [address], [place], [place] [address]    Report Status PENDING  Incomplete  Culture, expectorated sputum-assessment     Status: None   Collection Time: 04/08/19  7:26 PM   Specimen: Expectorated Sputum  Result Value Ref Range Status   Specimen Description EXPECTORATED SPUTUM  Final   Special Requests NONE   Final   Sputum evaluation   Final    Sputum specimen not acceptable for testing.  Please recollect.   RESULT CALLED TO, READ BACK BY AND VERIFIED WITH: DAWN SONGSTER ON 04/08/2019 AT 2108 TIK Performed at [facility], [address] [place], [place] [address]    Report Status 04/08/2019 FINAL  Final     Labs: BNP (last 3 results) Recent Labs    04/08/19 0848  BNP 993.5*   Basic Metabolic Panel: Recent Labs  Lab 04/07/19 0529 04/09/19 0127 04/10/19 0454 04/11/19 0454  NA 138 133* 134* 135  K 3.6 3.4* 3.9 3.6  CL 96* 99 104 103  CO2 28 22 21* 20*  GLUCOSE 168* 118* 121* 111*  BUN 22 20 19 22   CREATININE 1.07 1.09 1.09 1.15  CALCIUM 9.4 8.4* 8.2* 8.5*  MG 1.9 1.8  --   --   PHOS 3.3  --   --   --    Liver Function Tests: Recent Labs  Lab 04/07/19 0529  AST 23  ALT 14  ALKPHOS 66  BILITOT 0.8  PROT 8.7*  ALBUMIN 4.2   Recent Labs  Lab 04/07/19 0529  LIPASE 22   No results for input(s): AMMONIA in the last 168 hours. CBC: Recent Labs  Lab 04/07/19 0529 04/08/19 0848 04/09/19 0127 04/10/19 0454 04/11/19 0454  WBC 4.5 10.2 12.7* 10.7* 8.3  HGB 13.2 11.9* 11.5* 10.7* 10.9*  HCT 41.2 36.7* 35.1* 31.3* 31.7*  MCV 78.6* 77.3* 75.6* 72.8* 72.4*  PLT 248 206 193 150 177   Cardiac Enzymes: No results for input(s): CKTOTAL, CKMB, CKMBINDEX, TROPONINI in the last 168 hours. BNP: Invalid input(s): POCBNP CBG: Recent Labs  Lab 04/10/19 1234 04/10/19 1621 04/10/19 2101 04/11/19 0740 04/11/19 1141  GLUCAP 92 139* 104* 92 92   D-Dimer No results for input(s): DDIMER  in the last 72 hours. Hgb A1c No results for input(s): HGBA1C in the last 72 hours. Lipid Profile No results for input(s): CHOL, HDL, LDLCALC, TRIG, CHOLHDL, LDLDIRECT in the last 72 hours. Thyroid function studies No results for input(s): TSH, T4TOTAL, T3FREE, THYROIDAB in the last 72 hours.  Invalid input(s): FREET3 Anemia work up No results for input(s): VITAMINB12,  FOLATE, FERRITIN, TIBC, IRON, RETICCTPCT in the last 72 hours. Urinalysis    Component Value Date/Time   COLORURINE YELLOW (A) 04/07/2019 1028   APPEARANCEUR CLEAR (A) 04/07/2019 1028   APPEARANCEUR Clear 01/25/2013 1042   LABSPEC 1.035 (H) 04/07/2019 1028   LABSPEC 1.026 01/25/2013 1042   PHURINE 6.0 04/07/2019 1028   GLUCOSEU NEGATIVE 04/07/2019 1028   GLUCOSEU Negative 01/25/2013 1042   HGBUR NEGATIVE 04/07/2019 1028   BILIRUBINUR NEGATIVE 04/07/2019 1028   BILIRUBINUR Negative 01/25/2013 1042   KETONESUR NEGATIVE 04/07/2019 1028   PROTEINUR 100 (A) 04/07/2019 1028   UROBILINOGEN 1.0 04/28/2011 1934   NITRITE NEGATIVE 04/07/2019 1028   LEUKOCYTESUR NEGATIVE 04/07/2019 1028   LEUKOCYTESUR Negative 01/25/2013 1042   Sepsis Labs Invalid input(s): PROCALCITONIN,  WBC,  LACTICIDVEN Microbiology Recent Results (from the past 240 hour(s))  SARS CORONAVIRUS 2 (TAT 6-24 HRS) Nasopharyngeal Nasopharyngeal Swab     Status: None   Collection Time: 04/07/19 11:34 AM   Specimen: Nasopharyngeal Swab  Result Value Ref Range Status   SARS Coronavirus 2 NEGATIVE NEGATIVE Final    Comment: (NOTE) SARS-CoV-2 target nucleic acids are NOT DETECTED. The SARS-CoV-2 RNA is generally detectable in upper and lower respiratory specimens during the acute phase of infection. Negative results do not preclude SARS-CoV-2 infection, do not rule out co-infections with other pathogens, and should not be used as the sole basis for treatment or other patient management decisions. Negative results must be combined with clinical observations, patient history, and epidemiological information. The expected result is Negative. Fact Sheet for Patients: SugarRoll.be Fact Sheet for Healthcare Providers: https://www.woods-mathews.com/ This test is not yet approved or cleared by the [place] FDA and  has been authorized for detection and/or diagnosis of SARS-CoV-2  by FDA under an Emergency Use Authorization (EUA). This EUA will remain  in effect (meaning this test can be used) for the duration of the COVID-19 declaration under Section 56 4(b)(1) of the Act, 21 U.S.C. section 360bbb-3(b)(1), unless the authorization is terminated or revoked sooner. Performed at [facility], [place] [address], [place], [place] [address]   CULTURE, BLOOD (ROUTINE X 2) w Reflex to ID Panel     Status: None (Preliminary result)   Collection Time: 04/08/19  8:48 AM   Specimen: BLOOD  Result Value Ref Range Status   Specimen Description BLOOD RIGHT ANTECUBITAL  Final   Special Requests   Final    BOTTLES DRAWN AEROBIC AND ANAEROBIC Blood Culture adequate volume   Culture   Final    NO GROWTH 3 DAYS Performed at [facility], [address], [place], [place] [address]    Report Status PENDING  Incomplete  CULTURE, BLOOD (ROUTINE X 2) w Reflex to ID Panel     Status: None (Preliminary result)   Collection Time: 04/08/19  8:48 AM   Specimen: BLOOD  Result Value Ref Range Status   Specimen Description BLOOD BLOOD RIGHT HAND  Final   Special Requests   Final    BOTTLES DRAWN AEROBIC AND ANAEROBIC Blood Culture results may not be optimal due to an inadequate volume of blood received in culture  bottles   Culture   Final    NO GROWTH 3 DAYS Performed at [facility], [address] [place], [place] [address]    Report Status PENDING  Incomplete  Culture, expectorated sputum-assessment     Status: None   Collection Time: 04/08/19  7:26 PM   Specimen: Expectorated Sputum  Result Value Ref Range Status   Specimen Description EXPECTORATED SPUTUM  Final   Special Requests NONE  Final   Sputum evaluation   Final    Sputum specimen not acceptable for testing.  Please recollect.   RESULT CALLED TO, READ BACK BY AND VERIFIED WITH: DAWN SONGSTER ON 04/08/2019 AT 2108 TIK Performed at [facility], [address] [place], [place]  [address]    Report Status 04/08/2019 FINAL  Final    Time coordinating discharge:  35 minutes.  SIGNED:  Ivor Costa, DO [facility] 04/11/2019, 1:59 PM Pager is on [place]  If 7PM-7AM, please contact night-coverage [email or web address] Password [email or web address]

## 2019-04-11 NOTE — Discharge Instructions (Signed)
You were cared for by a hospitalist during your hospital stay. If you have any questions about your discharge medications or the care you received while you were in the hospital after you are discharged, you can call the unit and ask to speak with the hospitalist on call if the hospitalist that took care of you is not available. Once you are discharged, your primary care physician will handle any further medical issues. Please note that NO REFILLS for any discharge medications will be authorized once you are discharged, as it is imperative that you return to your primary care physician (or establish a relationship with a primary care physician if you do not have one) for your aftercare needs so that they can reassess your need for medications and monitor your lab values.  Follow up with PCP and vascular surgeon take all medications as prescribed. If symptoms change or worsen please return to the ED for evaluation.  Please take Eliquis 10 mg twice per day for 7 days first, and then start taking your 5 mg of Eliquis twice per day.

## 2019-04-11 NOTE — Progress Notes (Signed)
[facility]  Daily Progress Note   Subjective  - * No surgery found *  Patient states his flank pain is much better.  He is having some pain of his left lower extremity.  He rates it as somewhat moderate.  Objective Vitals:   04/11/19 0448 04/11/19 0500 04/11/19 0831 04/11/19 1141  BP: (!) 144/88  (!) 140/91 (!) 131/91  Pulse: 98  62 82  Resp: 20   20  Temp: 98.3 F (36.8 C)   98 F (36.7 C)  TempSrc: Oral   Oral  SpO2: 99%   100%  Weight:  104.8 kg    Height:        Intake/Output Summary (Last 24 hours) at 04/11/2019 1224 Last data filed at 04/11/2019 1008 Gross per 24 hour  Intake 1811.78 ml  Output 300 ml  Net 1511.78 ml    PULM  Normal effort , no use of accessory muscles CV  No JVD, RRR Abd      No distended, nontender VASC  physical examination of the lower extremities demonstrates 2-3+ pitting edema.  Both legs and feet are quite warm to the touch with brisk capillary refill.  I believe I feel a week dorsalis pedis pulse bilaterally.  There are no open wounds or sores.  The patient also insisted that I evaluate his left elbow.  Dressing is taken down staple line is clean dry and intact and looks quite good.  There is no drainage appears to be healing well.  Laboratory CBC    Component Value Date/Time   WBC 8.3 04/11/2019 0454   HGB 10.9 (L) 04/11/2019 0454   HGB 11.3 (L) 11/06/2013 0304   HCT 31.7 (L) 04/11/2019 0454   HCT 36.3 (L) 11/06/2013 0304   PLT 177 04/11/2019 0454   PLT 139 (L) 11/06/2013 0304    BMET    Component Value Date/Time   NA 135 04/11/2019 0454   NA 142 11/06/2013 0304   K 3.6 04/11/2019 0454   K 3.3 (L) 11/06/2013 0304   CL 103 04/11/2019 0454   CL 109 (H) 11/06/2013 0304   CO2 20 (L) 04/11/2019 0454   CO2 26 11/06/2013 0304   GLUCOSE 111 (H) 04/11/2019 0454   GLUCOSE 98 11/06/2013 0304   BUN 22 04/11/2019 0454   BUN 12 11/06/2013 0304   CREATININE 1.15 04/11/2019 0454   CREATININE 0.94 11/06/2013  0304   CALCIUM 8.5 (L) 04/11/2019 0454   CALCIUM 8.7 11/06/2013 0304   GFRNONAA >60 04/11/2019 0454   GFRNONAA >60 11/06/2013 0304   GFRAA >60 04/11/2019 0454   GFRAA >60 11/06/2013 0304    Assessment/Planning:   Atherosclerotic occlusive disease bilateral lower extremities:    I believe he is at his baseline.  I would continue Eliquis with a baby aspirin.  I have requested that he follow-up with me in the office where noninvasive studies and ultrasound evaluations of his arterial system can be obtained.    With respect to his renal infarct no further interventions or surgery is indicated at this time.    With respect to his history of DVT again he is on his Eliquis both for his atrial fibrillation as well as his DVT and this should adequately cover him.    He will follow-up with me in the office as an outpatient.    Hortencia Pilar  04/11/2019, 12:24 PM

## 2019-04-11 NOTE — Progress Notes (Signed)
Enrico Inzunza A and O x4. VSS. Pt tolerating diet well. No complaints of nausea or vomiting. IV removed intact, prescriptions given. Pt voices understanding of discharge instructions with no further questions. Patient discharged via wheelchair with NT  Allergies as of 04/11/2019   No Known Allergies     Medication List    STOP taking these medications   meloxicam 15 MG tablet Commonly known as: MOBIC   potassium chloride 10 MEQ tablet Commonly known as: KLOR-CON     TAKE these medications   allopurinol 100 MG tablet Commonly known as: ZYLOPRIM Take 100 mg by mouth 2 (two) times daily.   apixaban 5 MG Tabs tablet Commonly known as: Eliquis Take 2 tablets (10 mg total) by mouth 2 (two) times daily for 7 days. What changed: how much to take   apixaban 5 MG Tabs tablet Commonly known as: Eliquis Take 1 tablet (5 mg total) by mouth 2 (two) times daily. Start taking on: April 18, 2019 What changed: You were already taking a medication with the same name, and this prescription was added. Make sure you understand how and when to take each.   aspirin EC 81 MG tablet Take 1 tablet (81 mg total) by mouth daily.   azithromycin 250 MG tablet Commonly known as: Zithromax Please take 250 mg per day for two days   cyclobenzaprine 5 MG tablet Commonly known as: FLEXERIL Take 1 tablet (5 mg total) by mouth 3 (three) times daily as needed for muscle spasms.   furosemide 40 MG tablet Commonly known as: LASIX Take 1 tablet (40 mg total) by mouth daily for 30 days. What changed: when to take this   gabapentin 400 MG capsule Commonly known as: Neurontin Take 1 capsule (400 mg total) by mouth 3 (three) times daily.   guaiFENesin 600 MG 12 hr tablet Commonly known as: MUCINEX Take 1 tablet (600 mg total) by mouth 2 (two) times daily for 7 days.   HYDROcodone-acetaminophen 5-325 MG tablet Commonly known as: Norco Take 1-2 tablets by mouth every 6 (six) hours as needed.    losartan 50 MG tablet Commonly known as: COZAAR Take 50 mg by mouth every morning.   metFORMIN 500 MG tablet Commonly known as: GLUCOPHAGE Take 500 mg by mouth 2 (two) times daily with a meal.   metoprolol tartrate 25 MG tablet Commonly known as: LOPRESSOR Take 25 mg by mouth 2 (two) times daily.   polyethylene glycol 17 g packet Commonly known as: MIRALAX / GLYCOLAX Take 17 g by mouth daily as needed for mild constipation.   senna 8.6 MG Tabs tablet Commonly known as: SENOKOT Take 1 tablet (8.6 mg total) by mouth 2 (two) times daily.   simvastatin 20 MG tablet Commonly known as: ZOCOR Take 20 mg by mouth at bedtime.            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[place]) Active Problems:   Diabetes mellitus without complication ([place])   HTN (hypertension), malignant   DVT (deep venous thrombosis) ([place])   CVA, old, speech/language deficit   Essential (primary) hypertension   Gout   Pulmonary embolism ([place])  Abdominal pain   Atrial fibrillation, chronic ([place])   Fever   Sepsis ([place])  Question:  Patient needs a walker to treat with the following condition  Answer:  1+ pitting edema   04/11/19 1404          Vitals:   04/11/19 1141 04/11/19 1420  BP: (!) 131/91 127/81  Pulse: 82 (!) 104  Resp: 20 18  Temp: 98 F (36.7 C) 98 F (36.7 C)  SpO2: 100% 99%    Darnelle Catalan

## 2019-04-11 NOTE — Progress Notes (Signed)
I have met with [name] and spoken with him about the new directions for apixaban, which is 10 mg BID for 7 days, then 5 mg twice daily thereafter. I also wrote out these directions on a piece of paper and given him my Ascom number for any questions. The medication dose has been pre-approved and confirmed by fax. I also supplied a copy of the pre-approval to the patient.

## 2019-04-11 NOTE — Progress Notes (Signed)
PT Cancellation Note  Patient Details Name: [name] MRN: [id number] DOB: [birth year]   Cancelled Treatment:    Reason Eval/Treat Not Completed: Patient declined, no reason specified. Patient sitting up on side of bed. Reports he was sleeping.  Somewhat lethargic/groggy. Declines ambulation at this time, will re-attempt later today if time allows.     Dyrell Tuccillo 04/11/2019, 11:22 AM

## 2019-04-13 LAB — CULTURE, BLOOD (ROUTINE X 2)
Culture: NO GROWTH
Culture: NO GROWTH
Special Requests: ADEQUATE

## 2019-04-26 ENCOUNTER — Other Ambulatory Visit (INDEPENDENT_AMBULATORY_CARE_PROVIDER_SITE_OTHER): Payer: Self-pay | Admitting: Vascular Surgery

## 2019-04-26 DIAGNOSIS — I70293 Other atherosclerosis of native arteries of extremities, bilateral legs: Secondary | ICD-10-CM

## 2019-04-30 ENCOUNTER — Encounter (INDEPENDENT_AMBULATORY_CARE_PROVIDER_SITE_OTHER): Payer: Self-pay | Admitting: Vascular Surgery

## 2019-04-30 ENCOUNTER — Ambulatory Visit (INDEPENDENT_AMBULATORY_CARE_PROVIDER_SITE_OTHER): Payer: Medicare Other

## 2019-04-30 ENCOUNTER — Encounter (INDEPENDENT_AMBULATORY_CARE_PROVIDER_SITE_OTHER): Payer: Self-pay

## 2019-04-30 ENCOUNTER — Other Ambulatory Visit: Payer: Self-pay

## 2019-04-30 ENCOUNTER — Ambulatory Visit (INDEPENDENT_AMBULATORY_CARE_PROVIDER_SITE_OTHER): Payer: Medicare Other | Admitting: Vascular Surgery

## 2019-04-30 VITALS — BP 137/72 | HR 93 | Resp 16 | Wt 212.0 lb

## 2019-04-30 DIAGNOSIS — I482 Chronic atrial fibrillation, unspecified: Secondary | ICD-10-CM | POA: Diagnosis not present

## 2019-04-30 DIAGNOSIS — I83893 Varicose veins of bilateral lower extremities with other complications: Secondary | ICD-10-CM | POA: Diagnosis not present

## 2019-04-30 DIAGNOSIS — N280 Ischemia and infarction of kidney: Secondary | ICD-10-CM

## 2019-04-30 DIAGNOSIS — I739 Peripheral vascular disease, unspecified: Secondary | ICD-10-CM

## 2019-04-30 DIAGNOSIS — I70293 Other atherosclerosis of native arteries of extremities, bilateral legs: Secondary | ICD-10-CM | POA: Diagnosis not present

## 2019-04-30 DIAGNOSIS — E119 Type 2 diabetes mellitus without complications: Secondary | ICD-10-CM

## 2019-04-30 DIAGNOSIS — I10 Essential (primary) hypertension: Secondary | ICD-10-CM

## 2019-04-30 DIAGNOSIS — Z79899 Other long term (current) drug therapy: Secondary | ICD-10-CM | POA: Insufficient documentation

## 2019-04-30 NOTE — Progress Notes (Signed)
MRN : [id number]  [name] is a 68 y.o. (1951/04/04) male who presents with chief complaint of  Chief Complaint  Patient presents with  . Follow-up    [facility] 1week ultrasound follow up  .  History of Present Illness:   The patient returns to the office for followup and review of the noninvasive studies. There have been no interval changes in lower extremity symptoms. No interval shortening of the patient's claudication distance or development of rest pain symptoms. No new ulcers or wounds have occurred since the last visit.  There have been no significant changes to the patient's overall health care.  The patient denies amaurosis fugax or recent TIA symptoms. There are no recent neurological changes noted. The patient denies history of DVT, PE or superficial thrombophlebitis. The patient denies recent episodes of angina or shortness of breath.     Current Meds  Medication Sig  . allopurinol (ZYLOPRIM) 100 MG tablet Take 100 mg by mouth 2 (two) times daily.   [name] apixaban (ELIQUIS) 5 MG TABS tablet Take 1 tablet (5 mg total) by mouth 2 (two) times daily.  [name] aspirin EC 81 MG tablet Take 1 tablet (81 mg total) by mouth daily.  [name] azithromycin (ZITHROMAX) 250 MG tablet Please take 250 mg per day for two days  . cyclobenzaprine (FLEXERIL) 5 MG tablet Take 1 tablet (5 mg total) by mouth 3 (three) times daily as needed for muscle spasms.  . furosemide (LASIX) 40 MG tablet Take 1 tablet (40 mg total) by mouth daily for 30 days. (Patient taking differently: Take 40 mg by mouth 2 (two) times daily. )  . gabapentin (NEURONTIN) 400 MG capsule Take 1 capsule (400 mg total) by mouth 3 (three) times daily.  . metFORMIN (GLUCOPHAGE) 500 MG tablet Take 500 mg by mouth 2 (two) times daily with a meal.   . metoprolol tartrate (LOPRESSOR) 25 MG tablet Take 25 mg by mouth 2 (two) times daily.  . polyethylene glycol (MIRALAX / GLYCOLAX) 17 g packet Take 17 g by mouth daily as needed for mild  constipation.  . senna (SENOKOT) 8.6 MG TABS tablet Take 1 tablet (8.6 mg total) by mouth 2 (two) times daily.  . simvastatin (ZOCOR) 20 MG tablet Take 20 mg by mouth at bedtime.    Past Medical History:  Diagnosis Date  . Arthritis   . Atrial fibrillation ([place])   . Cocaine abuse ([place])    + UDS on admission  . Diabetes mellitus without complication ([place])   . Gout current and history of  . History of deep vein thrombosis 2006  . History of tobacco abuse    has smoked for 50 years  . Hypertension   . Pulmonary embolism ([place]) 2010  . Stroke [facility]) 2017   affected speech and right side-uses cane    Past Surgical History:  Procedure Laterality Date  . COLONOSCOPY    . OLECRANON BURSECTOMY Left 04/02/2019   Procedure: OLECRANON BURSA;  Surgeon: Earnestine Leys, MD;  Location: [facility] ORS;  Service: Orthopedics;  Laterality: Left;  [name] VASCULAR SURGERY     Stent in leg    Social History Social History   Tobacco Use  . Smoking status: Current Every Day Smoker    Packs/day: 0.50    Years: 50.00    Pack years: 25.00    Types: Cigarettes  . Smokeless tobacco: Never Used  Substance Use Topics  . Alcohol use: No  . Drug use: No    Comment:  Urinainary drug screen + cocaine on admit-last used cocaine in 2015    Family History Family History  Problem Relation Age of Onset  . Aneurysm Mother   . Diabetes Father     No Known Allergies   REVIEW OF SYSTEMS (Negative unless checked)  Constitutional: [] Weight loss  [] Fever  [] Chills Cardiac: [] Chest pain   [] Chest pressure   [] Palpitations   [] Shortness of breath when laying flat   [] Shortness of breath with exertion. Vascular:  [x] Pain in legs with walking   [] Pain in legs at rest  [] History of DVT   [] Phlebitis   [] Swelling in legs   [] Varicose veins   [] Non-healing ulcers Pulmonary:   [] Uses home oxygen   [] Productive cough   [] Hemoptysis   [] Wheeze  [] COPD   [] Asthma Neurologic:  [] Dizziness   [] Seizures   [] History of stroke    [] History of TIA  [] Aphasia   [] Vissual changes   [] Weakness or numbness in arm   [] Weakness or numbness in leg Musculoskeletal:   [] Joint swelling   [] Joint pain   [] Low back pain Hematologic:  [] Easy bruising  [] Easy bleeding   [] Hypercoagulable state   [] Anemic Gastrointestinal:  [] Diarrhea   [] Vomiting  [] Gastroesophageal reflux/heartburn   [] Difficulty swallowing. Genitourinary:  [] Chronic kidney disease   [] Difficult urination  [] Frequent urination   [] Blood in urine Skin:  [] Rashes   [] Ulcers  Psychological:  [] History of anxiety   []  History of major depression.  Physical Examination  Vitals:   04/30/19 1116  BP: 137/72  Pulse: 93  Resp: 16  Weight: 212 lb (96.2 kg)   Body mass index is 27.22 kg/m. Gen: WD/WN, NAD Head: [place]/AT, No temporalis wasting.  Ear/Nose/Throat: Hearing grossly intact, nares w/o erythema or drainage Eyes: PER, EOMI, sclera nonicteric.  Neck: Supple, no large masses.   Pulmonary:  Good air movement, no audible wheezing bilaterally, no use of accessory muscles.  Cardiac: RRR, no JVD Vascular:  Vessel Right Left  PT Not Palpable Not Palpable  DP Not Palpable Not Palpable  Gastrointestinal: Non-distended. No guarding/no peritoneal signs.  Musculoskeletal: M/S 5/5 throughout.  No deformity or atrophy.  Neurologic: CN 2-12 intact. Symmetrical.  Speech is fluent. Motor exam as listed above. Psychiatric: Judgment intact, Mood & affect appropriate for pt's clinical situation. Dermatologic: No rashes or ulcers noted.  No changes consistent with cellulitis. Lymph : No lichenification or skin changes of chronic lymphedema.  CBC Lab Results  Component Value Date   WBC 8.3 04/11/2019   HGB 10.9 (L) 04/11/2019   HCT 31.7 (L) 04/11/2019   MCV 72.4 (L) 04/11/2019   PLT 177 04/11/2019    BMET    Component Value Date/Time   NA 135 04/11/2019 0454   NA 142 11/06/2013 0304   K 3.6 04/11/2019 0454   K 3.3 (L) 11/06/2013 0304   CL 103 04/11/2019 0454   CL  109 (H) 11/06/2013 0304   CO2 20 (L) 04/11/2019 0454   CO2 26 11/06/2013 0304   GLUCOSE 111 (H) 04/11/2019 0454   GLUCOSE 98 11/06/2013 0304   BUN 22 04/11/2019 0454   BUN 12 11/06/2013 0304   CREATININE 1.15 04/11/2019 0454   CREATININE 0.94 11/06/2013 0304   CALCIUM 8.5 (L) 04/11/2019 0454   CALCIUM 8.7 11/06/2013 0304   GFRNONAA >60 04/11/2019 0454   GFRNONAA >60 11/06/2013 0304   GFRAA >60 04/11/2019 0454   GFRAA >60 11/06/2013 0304   Estimated Creatinine Clearance: 69.5 mL/min (by C-G formula based on SCr of  1.15 mg/dL).  COAG Lab Results  Component Value Date   INR 1.1 04/07/2019   INR 1.36 02/04/2018   INR 2.21 01/16/2018    Radiology CT ABDOMEN PELVIS W CONTRAST  Result Date: 04/07/2019 CLINICAL DATA:  Nausea and vomiting.  Abdominal pain. EXAM: CT ABDOMEN AND PELVIS WITH CONTRAST TECHNIQUE: Multidetector CT imaging of the abdomen and pelvis was performed using the standard protocol following bolus administration of intravenous contrast. CONTRAST:  18mL OMNIPAQUE IOHEXOL 300 MG/ML  SOLN COMPARISON:  06/22/2018 FINDINGS: Lower chest:  Cardiomegaly.  Stable mild scarring at the bases Hepatobiliary: No focal liver abnormality.No evidence of biliary obstruction or stone. Pancreas: Unremarkable. Spleen: Unremarkable. Adrenals/Urinary Tract: Negative adrenals. Dense non enhancement of a moderate area in the upper and interpolar left kidney compatible with segmental arterial occlusion. The main left renal artery is widely patent. No accessory renal artery with seen on prior study. Best seen on sagittal reformats there is an associated segmental renal artery occlusion. Mild right lower pole renal cortical scarring. No hydronephrosis. Unremarkable bladder. Stomach/Bowel: No obstruction. No evidence of bowel inflammation including appendicitis Vascular/Lymphatic: Unremarkable appearance of IVC filter. Luminal or mural calcification at the bilateral common femoral vein compatible with  sequela of remote DVT. Atherosclerotic calcification. No aortic ulceration or dissection. Left renal artery finding as described. Reproductive:No pathologic findings. Other: No ascites or pneumoperitoneum. Musculoskeletal: Spondylosis with remarkably bulky spurring and multilevel bridging osteophyte. IMPRESSION: 1. 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 No underlying dissection or definite embolic source identified. No evident area of infarction or ischemia in the other viscera. 2. Cardiomegaly 3.  Aortic Atherosclerosis (ICD10-I70.0). 4. Bilateral deep femoral vein calcification correlating with remote DVT. An IVC filter is in place. Electronically Signed   By: Monte Fantasia M.D.   On: 04/07/2019 09:40   Portable CXR  Result Date: 04/08/2019 CLINICAL DATA:  Fever EXAM: PORTABLE CHEST 1 VIEW COMPARISON:  Chest radiograph dated 02/04/2018. FINDINGS: The heart is enlarged. There is mild left basilar atelectasis. The right lung is clear. There is no pleural effusion or pneumothorax. Degenerative changes are seen in the thoracic spine and shoulders. IMPRESSION: 1. Mild left basilar atelectasis. 2. Unchanged cardiomegaly. Electronically Signed   By: Zerita Boers M.D.   On: 04/08/2019 12:09   ECHOCARDIOGRAM COMPLETE  Result Date: 04/08/2019   ECHOCARDIOGRAM REPORT   Patient Name:   OCIE Mormile Date of Exam: 04/08/2019 Medical Rec #:  [id number]          Height:       74.0 in Accession #:    [id number]         Weight:       250.0 lb Date of Birth:  03-Jul-1950           BSA:          2.39 m Patient Age:    28 years           BP:           155/86 mmHg Patient Gender: M                  HR:           91 bpm. Exam Location:  [facility] Procedure: 2D Echo Indications:     Infaction of Kidney  History:         Patient has prior history of Echocardiogram examinations, most                  recent 10/04/2016.  Stroke; Risk Factors:Diabetes and                  Hypertension.  Sonographer:      Avanell Shackleton Referring Phys:  [place] Diagnosing Phys: Serafina Royals MD IMPRESSIONS  1. Left ventricular ejection fraction, by visual estimation, is 50 to 55%. The left ventricle has normal function. There is no left ventricular hypertrophy.  2. Global right ventricle has normal systolic function.The right ventricular size is normal. No increase in right ventricular wall thickness.  3. Left atrial size was moderately dilated.  4. Right atrial size was moderately dilated.  5. The mitral valve is normal in structure. Mild to moderate mitral valve regurgitation.  6. The tricuspid valve is normal in structure. Tricuspid valve regurgitation mild-moderate.  7. The aortic valve is normal in structure. Aortic valve regurgitation is trivial.  8. The pulmonic valve was grossly normal. Pulmonic valve regurgitation is not visualized.  9. Moderately elevated pulmonary artery systolic pressure. FINDINGS  Left Ventricle: Left ventricular ejection fraction, by visual estimation, is 50 to 55%. The left ventricle has normal function. There is no left ventricular hypertrophy. Right Ventricle: The right ventricular size is normal. No increase in right ventricular wall thickness. Global RV systolic function is has normal systolic function. The tricuspid regurgitant velocity is 2.90 m/s, and with an assumed right atrial pressure  of 10 mmHg, the estimated right ventricular systolic pressure is moderately elevated at 43.6 mmHg. Left Atrium: Left atrial size was moderately dilated. Right Atrium: Right atrial size was moderately dilated Pericardium: There is no evidence of pericardial effusion. Mitral Valve: The mitral valve is normal in structure. Mild to moderate mitral valve regurgitation. Tricuspid Valve: The tricuspid valve is normal in structure. Tricuspid valve regurgitation mild-moderate. Aortic Valve: The aortic valve is normal in structure. Aortic valve regurgitation is trivial. Pulmonic Valve: The pulmonic valve  was grossly normal. Pulmonic valve regurgitation is not visualized. Aorta: The aortic root and ascending aorta are structurally normal, with no evidence of dilitation. IAS/Shunts: No atrial level shunt detected by color flow Doppler.  LEFT VENTRICLE PLAX 2D LVIDd:         4.89 cm LVIDs:         3.55 cm LV PW:         1.29 cm LV IVS:        1.01 cm LVOT diam:     2.30 cm LV SV:         60 ml LV SV Index:   24.22 LVOT Area:     4.15 cm  RIGHT VENTRICLE RV S prime:     14.00 cm/s LEFT ATRIUM              Index       RIGHT ATRIUM           Index LA diam:        4.30 cm  1.80 cm/m  RA Area:     29.90 cm LA Vol (A2C):   130.0 ml 54.40 ml/m RA Volume:   93.20 ml  39.00 ml/m LA Vol (A4C):   69.5 ml  29.08 ml/m LA Biplane Vol: 96.7 ml  40.47 ml/m   AORTA Ao Root diam: 3.40 cm TRICUSPID VALVE TR Peak grad:   33.6 mmHg TR Vmax:        360.00 cm/s  SHUNTS Systemic Diam: 2.30 cm  Serafina Royals MD Electronically signed by Serafina Royals MD Signature Date/Time: 04/08/2019/5:22:32 PM    Final  Assessment/Plan 1. PAD (peripheral artery disease) (HCC)  Recommend:  The patient has evidence of atherosclerosis of the lower extremities with claudication.  The patient does not voice lifestyle limiting changes at this point in time.  Noninvasive studies do not suggest clinically significant change.  No invasive studies, angiography or surgery at this time The patient should continue walking and begin a more formal exercise program.  The patient should continue antiplatelet therapy and aggressive treatment of the lipid abnormalities  No changes in the patient's medications at this time  The patient should continue wearing graduated compression socks 10-15 mmHg strength to control the mild edema.   - ABI; Future  2. 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HCC) No further intervention   3. Varicose veins of leg with swelling, bilateral No surgery or intervention at this point in time.    I have had a long discussion  with the patient regarding venous insufficiency and why it  causes symptoms. I have discussed with the patient the chronic skin changes that accompany venous insufficiency and the long term sequela such as infection and ulceration.  Patient will begin wearing graduated compression stockings class 1 (20-30 mmHg) or compression wraps on a daily basis a prescription was given. The patient will put the stockings on first thing in the morning and removing them in the evening. The patient is instructed specifically not to sleep in the stockings.    In addition, behavioral modification including several periods of elevation of the lower extremities during the day will be continued. I have demonstrated that proper elevation is a position with the ankles at heart level.  The patient is instructed to begin routine exercise, especially walking on a daily basis   4. Atrial fibrillation, chronic (HCC) Continue antiarrhythmia medications as already ordered, these medications have been reviewed and there are no changes at this time.  Continue anticoagulation as ordered by Cardiology Service   5. Essential (primary) hypertension Continue antihypertensive medications as already ordered, these medications have been reviewed and there are no changes at this time.   6. Diabetes mellitus without complication ([place]) Continue hypoglycemic medications as already ordered, these medications have been reviewed and there are no changes at this time.  Hgb A1C to be monitored as already arranged by primary service     Hortencia Pilar, MD  04/30/2019 11:25 AM

## 2019-05-20 ENCOUNTER — Encounter (INDEPENDENT_AMBULATORY_CARE_PROVIDER_SITE_OTHER): Payer: Self-pay | Admitting: Vascular Surgery

## 2019-05-20 DIAGNOSIS — I739 Peripheral vascular disease, unspecified: Secondary | ICD-10-CM | POA: Insufficient documentation

## 2019-05-21 ENCOUNTER — Ambulatory Visit: Payer: Medicare Other | Admitting: Podiatry

## 2019-05-24 ENCOUNTER — Ambulatory Visit: Payer: Medicare Other | Admitting: Podiatry

## 2019-05-31 ENCOUNTER — Encounter: Payer: Self-pay | Admitting: Podiatry

## 2019-05-31 ENCOUNTER — Other Ambulatory Visit: Payer: Self-pay

## 2019-05-31 ENCOUNTER — Ambulatory Visit (INDEPENDENT_AMBULATORY_CARE_PROVIDER_SITE_OTHER): Payer: Medicare Other | Admitting: Podiatry

## 2019-05-31 DIAGNOSIS — E1142 Type 2 diabetes mellitus with diabetic polyneuropathy: Secondary | ICD-10-CM | POA: Diagnosis not present

## 2019-05-31 DIAGNOSIS — B351 Tinea unguium: Secondary | ICD-10-CM | POA: Diagnosis not present

## 2019-05-31 DIAGNOSIS — M79674 Pain in right toe(s): Secondary | ICD-10-CM

## 2019-05-31 DIAGNOSIS — M79675 Pain in left toe(s): Secondary | ICD-10-CM

## 2019-05-31 NOTE — Progress Notes (Signed)
Complaint:  Visit Type: Patient returns to my office for continued preventative foot care services. Complaint: Patient states" my nails have grown long and thick and become painful to walk and wear shoes" Patient has been diagnosed with DM with neuropathy.. The patient presents for preventative foot care services. No changes to ROS.    Podiatric Exam: Vascular: dorsalis pedis and posterior tibial pulses are palpable bilateral. Capillary return is immediate. Temperature gradient is WNL. Skin turgor WNL .  Venous stasis leg/foot  B/L. Sensorium: Diminished  Semmes Weinstein monofilament test. Normal tactile sensation bilaterally. Nail Exam: Pt has thick disfigured discolored nails with subungual debris noted bilateral entire nail hallux through fifth toenails Ulcer Exam: There is no evidence of ulcer or pre-ulcerative changes or infection. Orthopedic Exam: Muscle tone and strength are WNL. No limitations in general ROM. No crepitus or effusions noted. Foot type and digits show no abnormalities. DJD midfoot right.  HAV  B/L. Skin: No Porokeratosis. No infection or ulcers  Diagnosis:  Onychomycosis, , Pain in right toe, pain in left toes  Treatment & Plan Procedures and Treatment: Consent by patient was obtained for treatment procedures.   Debridement of mycotic and hypertrophic toenails, 1 through 5 bilateral and clearing of subungual debris. No ulceration, no infection noted.  Return Visit-Office Procedure: Patient instructed to return to the office for a follow up visit 3 months for continued evaluation and treatment.    Gardiner Barefoot DPM

## 2019-06-14 DIAGNOSIS — M79642 Pain in left hand: Secondary | ICD-10-CM | POA: Insufficient documentation

## 2019-06-14 DIAGNOSIS — M79641 Pain in right hand: Secondary | ICD-10-CM | POA: Insufficient documentation

## 2019-06-27 DIAGNOSIS — M25441 Effusion, right hand: Secondary | ICD-10-CM | POA: Insufficient documentation

## 2019-06-29 ENCOUNTER — Emergency Department: Payer: Medicare Other

## 2019-06-29 ENCOUNTER — Other Ambulatory Visit: Payer: Self-pay

## 2019-06-29 ENCOUNTER — Encounter: Payer: Self-pay | Admitting: Emergency Medicine

## 2019-06-29 ENCOUNTER — Emergency Department
Admission: EM | Admit: 2019-06-29 | Discharge: 2019-06-29 | Disposition: A | Discharge status: Home / Self Care | Payer: Medicare Other | Attending: Student in an Organized Health Care Education/Training Program | Admitting: Student in an Organized Health Care Education/Training Program

## 2019-06-29 DIAGNOSIS — E119 Type 2 diabetes mellitus without complications: Secondary | ICD-10-CM | POA: Insufficient documentation

## 2019-06-29 DIAGNOSIS — M25551 Pain in right hip: Secondary | ICD-10-CM

## 2019-06-29 DIAGNOSIS — I10 Essential (primary) hypertension: Secondary | ICD-10-CM | POA: Diagnosis not present

## 2019-06-29 DIAGNOSIS — R52 Pain, unspecified: Secondary | ICD-10-CM

## 2019-06-29 DIAGNOSIS — Z7984 Long term (current) use of oral hypoglycemic drugs: Secondary | ICD-10-CM | POA: Diagnosis not present

## 2019-06-29 DIAGNOSIS — F17210 Nicotine dependence, cigarettes, uncomplicated: Secondary | ICD-10-CM | POA: Insufficient documentation

## 2019-06-29 DIAGNOSIS — Z8673 Personal history of transient ischemic attack (TIA), and cerebral infarction without residual deficits: Secondary | ICD-10-CM | POA: Insufficient documentation

## 2019-06-29 DIAGNOSIS — Z79899 Other long term (current) drug therapy: Secondary | ICD-10-CM | POA: Diagnosis not present

## 2019-06-29 DIAGNOSIS — Z7901 Long term (current) use of anticoagulants: Secondary | ICD-10-CM | POA: Insufficient documentation

## 2019-06-29 MED ORDER — LIDOCAINE 5 % EX PTCH: 1.0000 | MEDICATED_PATCH | Freq: Two times a day (BID) | CUTANEOUS | 0 refills | Status: DC

## 2019-06-29 MED ORDER — LIDOCAINE 5 % EX PTCH
1.0000 | MEDICATED_PATCH | CUTANEOUS | Status: DC
  Administered 2019-06-29: 15:00:00 1 via TRANSDERMAL
  Filled 2019-06-29: qty 1
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
## 2019-06-29 NOTE — ED Notes (Signed)
Pt trx to x-ray

## 2019-06-29 NOTE — ED Triage Notes (Signed)
Pt to ED via POV c/o right hip pain x 2 days. Pt denies any known injury. Pt is in NAD.

## 2019-06-29 NOTE — Discharge Instructions (Addendum)
Follow-up with your primary care provider if any continued problems.  Continue taking your pain medication that was prescribed to you by your orthopedist.  The patch that was placed on you while in the ED is a pain patch.  Call make an appointment on Monday for reevaluation if you continue to have problems.

## 2019-08-30 ENCOUNTER — Ambulatory Visit (INDEPENDENT_AMBULATORY_CARE_PROVIDER_SITE_OTHER): Payer: Medicare Other | Admitting: Podiatry

## 2019-08-30 ENCOUNTER — Encounter: Payer: Self-pay | Admitting: Podiatry

## 2019-08-30 ENCOUNTER — Other Ambulatory Visit: Payer: Self-pay

## 2019-08-30 VITALS — Temp 94.8°F

## 2019-08-30 DIAGNOSIS — B351 Tinea unguium: Secondary | ICD-10-CM | POA: Diagnosis not present

## 2019-08-30 DIAGNOSIS — E1142 Type 2 diabetes mellitus with diabetic polyneuropathy: Secondary | ICD-10-CM

## 2019-08-30 DIAGNOSIS — M79675 Pain in left toe(s): Secondary | ICD-10-CM | POA: Diagnosis not present

## 2019-08-30 DIAGNOSIS — M79674 Pain in right toe(s): Secondary | ICD-10-CM

## 2019-08-30 DIAGNOSIS — I739 Peripheral vascular disease, unspecified: Secondary | ICD-10-CM

## 2019-08-30 NOTE — Progress Notes (Signed)
This patient returns to my office for at risk foot care.  This patient requires this care by a professional since this patient will be at risk due to having diabetes, history of DVT and PAD.  Patient is taking eliquiss.  This patient is unable to cut nails himself since the patient cannot reach his nails.These nails are painful walking and wearing shoes.  This patient presents for at risk foot care today.  General Appearance  Alert, conversant and in no acute stress.  Vascular  Dorsalis pedis and posterior tibial  pulses are palpable  bilaterally.  Capillary return is within normal limits  bilaterally. Temperature is within normal limits  Bilaterally. Venous stasis legs  B/l.  Neurologic  Senn-Weinstein monofilament wire test diminished   bilaterally. Muscle power within normal limits bilaterally.  Nails Thick disfigured discolored nails with subungual debris  from hallux to fifth toes bilaterally. No evidence of bacterial infection or drainage bilaterally.  Orthopedic  No limitations of motion  feet .  No crepitus or effusions noted.  No bony pathology or digital deformities noted.  DJD midfoot right.  HAV  B/L  Skin  normotropic skin with no porokeratosis noted bilaterally.  No signs of infections or ulcers noted.     Onychomycosis  Pain in right toes  Pain in left toes  Consent was obtained for treatment procedures.   Mechanical debridement of nails 1-5  bilaterally performed with a nail nipper.  Filed with dremel without incident.    Return office visit   3 months                   Told patient to return for periodic foot care and evaluation due to potential at risk complications.   Gardiner Barefoot DPM

## 2019-09-12 ENCOUNTER — Inpatient Hospital Stay
Admission: EM | Admit: 2019-09-12 | Discharge: 2019-09-18 | DRG: 065 | Disposition: A | Discharge status: Rehabilitation Facility | Payer: Medicare Other | Attending: Internal Medicine | Admitting: Internal Medicine

## 2019-09-12 ENCOUNTER — Other Ambulatory Visit: Payer: Self-pay

## 2019-09-12 ENCOUNTER — Emergency Department: Payer: Medicare Other

## 2019-09-12 ENCOUNTER — Observation Stay: Payer: Medicare Other

## 2019-09-12 ENCOUNTER — Encounter: Payer: Self-pay | Admitting: Internal Medicine

## 2019-09-12 DIAGNOSIS — R001 Bradycardia, unspecified: Secondary | ICD-10-CM

## 2019-09-12 DIAGNOSIS — R42 Dizziness and giddiness: Secondary | ICD-10-CM

## 2019-09-12 DIAGNOSIS — I63531 Cerebral infarction due to unspecified occlusion or stenosis of right posterior cerebral artery: Principal | ICD-10-CM | POA: Diagnosis present

## 2019-09-12 DIAGNOSIS — I10 Essential (primary) hypertension: Secondary | ICD-10-CM

## 2019-09-12 DIAGNOSIS — F17210 Nicotine dependence, cigarettes, uncomplicated: Secondary | ICD-10-CM | POA: Diagnosis present

## 2019-09-12 DIAGNOSIS — I482 Chronic atrial fibrillation, unspecified: Secondary | ICD-10-CM

## 2019-09-12 DIAGNOSIS — I4729 Other ventricular tachycardia: Secondary | ICD-10-CM

## 2019-09-12 DIAGNOSIS — E119 Type 2 diabetes mellitus without complications: Secondary | ICD-10-CM

## 2019-09-12 DIAGNOSIS — E1140 Type 2 diabetes mellitus with diabetic neuropathy, unspecified: Secondary | ICD-10-CM | POA: Diagnosis present

## 2019-09-12 DIAGNOSIS — Z79891 Long term (current) use of opiate analgesic: Secondary | ICD-10-CM

## 2019-09-12 DIAGNOSIS — Z720 Tobacco use: Secondary | ICD-10-CM

## 2019-09-12 DIAGNOSIS — R4781 Slurred speech: Secondary | ICD-10-CM | POA: Diagnosis not present

## 2019-09-12 DIAGNOSIS — R519 Headache, unspecified: Secondary | ICD-10-CM

## 2019-09-12 DIAGNOSIS — E1142 Type 2 diabetes mellitus with diabetic polyneuropathy: Secondary | ICD-10-CM

## 2019-09-12 DIAGNOSIS — Z7901 Long term (current) use of anticoagulants: Secondary | ICD-10-CM

## 2019-09-12 DIAGNOSIS — E785 Hyperlipidemia, unspecified: Secondary | ICD-10-CM | POA: Diagnosis present

## 2019-09-12 DIAGNOSIS — Z86718 Personal history of other venous thrombosis and embolism: Secondary | ICD-10-CM

## 2019-09-12 DIAGNOSIS — F1411 Cocaine abuse, in remission: Secondary | ICD-10-CM | POA: Diagnosis present

## 2019-09-12 DIAGNOSIS — G4730 Sleep apnea, unspecified: Secondary | ICD-10-CM | POA: Diagnosis present

## 2019-09-12 DIAGNOSIS — I69351 Hemiplegia and hemiparesis following cerebral infarction affecting right dominant side: Secondary | ICD-10-CM

## 2019-09-12 DIAGNOSIS — E1169 Type 2 diabetes mellitus with other specified complication: Secondary | ICD-10-CM | POA: Diagnosis present

## 2019-09-12 DIAGNOSIS — R29705 NIHSS score 5: Secondary | ICD-10-CM | POA: Diagnosis present

## 2019-09-12 DIAGNOSIS — I639 Cerebral infarction, unspecified: Secondary | ICD-10-CM | POA: Diagnosis not present

## 2019-09-12 DIAGNOSIS — E876 Hypokalemia: Secondary | ICD-10-CM | POA: Diagnosis present

## 2019-09-12 DIAGNOSIS — I429 Cardiomyopathy, unspecified: Secondary | ICD-10-CM | POA: Diagnosis present

## 2019-09-12 DIAGNOSIS — H543 Unqualified visual loss, both eyes: Secondary | ICD-10-CM

## 2019-09-12 DIAGNOSIS — I472 Ventricular tachycardia: Secondary | ICD-10-CM | POA: Diagnosis present

## 2019-09-12 DIAGNOSIS — I959 Hypotension, unspecified: Secondary | ICD-10-CM | POA: Diagnosis present

## 2019-09-12 DIAGNOSIS — N179 Acute kidney failure, unspecified: Secondary | ICD-10-CM

## 2019-09-12 DIAGNOSIS — Z86711 Personal history of pulmonary embolism: Secondary | ICD-10-CM

## 2019-09-12 DIAGNOSIS — Z7984 Long term (current) use of oral hypoglycemic drugs: Secondary | ICD-10-CM

## 2019-09-12 DIAGNOSIS — F1410 Cocaine abuse, uncomplicated: Secondary | ICD-10-CM | POA: Diagnosis present

## 2019-09-12 DIAGNOSIS — H18419 Arcus senilis, unspecified eye: Secondary | ICD-10-CM | POA: Diagnosis present

## 2019-09-12 DIAGNOSIS — J449 Chronic obstructive pulmonary disease, unspecified: Secondary | ICD-10-CM | POA: Diagnosis present

## 2019-09-12 DIAGNOSIS — Z833 Family history of diabetes mellitus: Secondary | ICD-10-CM

## 2019-09-12 DIAGNOSIS — Z20822 Contact with and (suspected) exposure to covid-19: Secondary | ICD-10-CM | POA: Diagnosis present

## 2019-09-12 DIAGNOSIS — Z79899 Other long term (current) drug therapy: Secondary | ICD-10-CM

## 2019-09-12 HISTORY — DX: Cocaine abuse, in remission: F14.11

## 2019-09-12 HISTORY — DX: Asymptomatic varicose veins of bilateral lower extremities: I83.93

## 2019-09-12 LAB — COMPREHENSIVE METABOLIC PANEL
ALT: 17 U/L (ref 0–44)
AST: 24 U/L (ref 15–41)
Albumin: 3.6 g/dL (ref 3.5–5.0)
Alkaline Phosphatase: 72 U/L (ref 38–126)
Anion gap: 8 (ref 5–15)
BUN: 16 mg/dL (ref 8–23)
CO2: 28 mmol/L (ref 22–32)
Calcium: 8.8 mg/dL — ABNORMAL LOW (ref 8.9–10.3)
Chloride: 106 mmol/L (ref 98–111)
Creatinine, Ser: 0.93 mg/dL (ref 0.61–1.24)
GFR calc Af Amer: >60 mL/min (ref >60)
GFR calc non Af Amer: >60 mL/min (ref >60)
Glucose, Bld: 155 mg/dL — ABNORMAL HIGH (ref 70–99)
Potassium: 3.2 mmol/L — ABNORMAL LOW (ref 3.5–5.1)
Sodium: 142 mmol/L (ref 135–145)
Total Bilirubin: 0.7 mg/dL (ref 0.3–1.2)
Total Protein: 6.6 g/dL (ref 6.5–8.1)

## 2019-09-12 LAB — DIFFERENTIAL
Abs Immature Granulocytes: 0.01 10*3/uL (ref 0.00–0.07)
Basophils Absolute: 0 10*3/uL (ref 0.0–0.1)
Basophils Relative: 1 %
Eosinophils Absolute: 0.1 10*3/uL (ref 0.0–0.5)
Eosinophils Relative: 3 %
Immature Granulocytes: 0 %
Lymphocytes Relative: 34 %
Lymphs Abs: 1 10*3/uL (ref 0.7–4.0)
Monocytes Absolute: 0.3 10*3/uL (ref 0.1–1.0)
Monocytes Relative: 10 %
Neutro Abs: 1.6 10*3/uL — ABNORMAL LOW (ref 1.7–7.7)
Neutrophils Relative %: 52 %

## 2019-09-12 LAB — CBC
HCT: 37.9 % — ABNORMAL LOW (ref 39.0–52.0)
Hemoglobin: 11.7 g/dL — ABNORMAL LOW (ref 13.0–17.0)
MCH: 25.2 pg — ABNORMAL LOW (ref 26.0–34.0)
MCHC: 30.9 g/dL (ref 30.0–36.0)
MCV: 81.5 fL (ref 80.0–100.0)
Platelets: 152 10*3/uL (ref 150–400)
RBC: 4.65 MIL/uL (ref 4.22–5.81)
RDW: 18.3 % — ABNORMAL HIGH (ref 11.5–15.5)
WBC: 3 10*3/uL — ABNORMAL LOW (ref 4.0–10.5)
nRBC: 0 % (ref 0.0–0.2)

## 2019-09-12 LAB — HEMOGLOBIN A1C
Hgb A1c MFr Bld: 6.2 % — ABNORMAL HIGH (ref 4.8–5.6)
Mean Plasma Glucose: 131.24 mg/dL

## 2019-09-12 LAB — PROTIME-INR
INR: 1.1 (ref 0.8–1.2)
Prothrombin Time: 13.4 seconds (ref 11.4–15.2)

## 2019-09-12 LAB — APTT: aPTT: 30 seconds (ref 24–36)

## 2019-09-12 LAB — GLUCOSE, CAPILLARY
Glucose-Capillary: 162 mg/dL — ABNORMAL HIGH (ref 70–99)
Glucose-Capillary: 82 mg/dL (ref 70–99)
Glucose-Capillary: 100 mg/dL — ABNORMAL HIGH (ref 70–99)

## 2019-09-12 LAB — RESPIRATORY PANEL BY RT PCR (FLU A&B, COVID)
Influenza A by PCR: NEGATIVE
Influenza B by PCR: NEGATIVE
SARS Coronavirus 2 by RT PCR: NEGATIVE

## 2019-09-12 MED ORDER — GABAPENTIN 400 MG PO CAPS
400.0000 mg | ORAL_CAPSULE | Freq: Three times a day (TID) | ORAL | Status: DC
  Administered 2019-09-12 – 2019-09-17 (×16): 400 mg via ORAL
  Filled 2019-09-12 (×17): qty 1

## 2019-09-12 MED ORDER — METOPROLOL TARTRATE 25 MG PO TABS
12.5000 mg | ORAL_TABLET | Freq: Two times a day (BID) | ORAL | Status: DC
  Administered 2019-09-12: 23:00:00 12.5 mg via ORAL
  Filled 2019-09-12: qty 1

## 2019-09-12 MED ORDER — HYDROCODONE-ACETAMINOPHEN 5-325 MG PO TABS: 1.0000 | ORAL_TABLET | Freq: Four times a day (QID) | ORAL | Status: DC | PRN

## 2019-09-12 MED ORDER — CYCLOBENZAPRINE HCL 10 MG PO TABS
5.0000 mg | ORAL_TABLET | Freq: Three times a day (TID) | ORAL | Status: DC | PRN
  Administered 2019-09-17: 5 mg via ORAL
  Filled 2019-09-12: qty 1

## 2019-09-12 MED ORDER — COLCHICINE 0.6 MG PO TABS
0.6000 mg | ORAL_TABLET | Freq: Every day | ORAL | Status: DC
  Administered 2019-09-13 – 2019-09-17 (×5): 0.6 mg via ORAL
  Filled 2019-09-12 (×5): qty 1

## 2019-09-12 MED ORDER — LIDOCAINE 5 % EX PTCH
1.0000 | MEDICATED_PATCH | Freq: Two times a day (BID) | CUTANEOUS | Status: DC
  Administered 2019-09-15 – 2019-09-18 (×5): 1 via TRANSDERMAL
  Filled 2019-09-12 (×12): qty 1

## 2019-09-12 MED ORDER — ATORVASTATIN CALCIUM 20 MG PO TABS
80.0000 mg | ORAL_TABLET | Freq: Every evening | ORAL | Status: DC
  Administered 2019-09-13 – 2019-09-17 (×5): 80 mg via ORAL
  Filled 2019-09-12 (×5): qty 4

## 2019-09-12 MED ORDER — TRAMADOL HCL 50 MG PO TABS
50.0000 mg | ORAL_TABLET | Freq: Four times a day (QID) | ORAL | Status: DC | PRN
  Administered 2019-09-12 – 2019-09-17 (×4): 50 mg via ORAL
  Filled 2019-09-12 (×4): qty 1

## 2019-09-12 MED ORDER — INSULIN ASPART 100 UNIT/ML ~~LOC~~ SOLN: 0.0000 [IU] | Freq: Every day | SUBCUTANEOUS | Status: DC

## 2019-09-12 MED ORDER — ACETAMINOPHEN 325 MG PO TABS
650.0000 mg | ORAL_TABLET | ORAL | Status: DC | PRN
  Administered 2019-09-13 – 2019-09-17 (×7): 650 mg via ORAL
  Filled 2019-09-12 (×7): qty 2

## 2019-09-12 MED ORDER — IOHEXOL 350 MG/ML SOLN
75.0000 mL | Freq: Once | INTRAVENOUS | Status: AC | PRN
  Administered 2019-09-12: 15:00:00 75 mL via INTRAVENOUS

## 2019-09-12 MED ORDER — NICOTINE 7 MG/24HR TD PT24
7.0000 mg | MEDICATED_PATCH | Freq: Every day | TRANSDERMAL | Status: DC
  Administered 2019-09-13 – 2019-09-18 (×6): 7 mg via TRANSDERMAL
  Filled 2019-09-12 (×7): qty 1

## 2019-09-12 MED ORDER — ACETAMINOPHEN 160 MG/5ML PO SOLN
650.0000 mg | ORAL | Status: DC | PRN
  Filled 2019-09-12: qty 20.3

## 2019-09-12 MED ORDER — ACETAMINOPHEN 325 MG PO TABS
ORAL_TABLET | ORAL | Status: AC
  Administered 2019-09-12: 650 mg
  Filled 2019-09-12: qty 2

## 2019-09-12 MED ORDER — INSULIN ASPART 100 UNIT/ML ~~LOC~~ SOLN
0.0000 [IU] | Freq: Three times a day (TID) | SUBCUTANEOUS | Status: DC
  Administered 2019-09-14: 13:00:00 1 [IU] via SUBCUTANEOUS
  Filled 2019-09-12: qty 1

## 2019-09-12 MED ORDER — METOPROLOL TARTRATE 25 MG PO TABS: 25.0000 mg | ORAL_TABLET | Freq: Two times a day (BID) | ORAL | Status: DC

## 2019-09-12 MED ORDER — STROKE: EARLY STAGES OF RECOVERY BOOK: Freq: Once | Status: AC

## 2019-09-12 MED ORDER — ACETAMINOPHEN 650 MG RE SUPP: 650.0000 mg | RECTAL | Status: DC | PRN

## 2019-09-12 MED ORDER — POLYETHYLENE GLYCOL 3350 17 G PO PACK: 17.0000 g | PACK | Freq: Every day | ORAL | Status: DC | PRN

## 2019-09-12 MED ORDER — SODIUM CHLORIDE 0.9% FLUSH
3.0000 mL | Freq: Once | INTRAVENOUS | Status: AC
Start: 2019-09-12 — End: 2019-09-12
  Administered 2019-09-12: 3 mL via INTRAVENOUS

## 2019-09-12 MED ORDER — APIXABAN 5 MG PO TABS
5.0000 mg | ORAL_TABLET | Freq: Two times a day (BID) | ORAL | Status: DC
  Administered 2019-09-12 – 2019-09-18 (×12): 5 mg via ORAL
  Filled 2019-09-12 (×12): qty 1

## 2019-09-12 MED ORDER — FUROSEMIDE 40 MG PO TABS
40.0000 mg | ORAL_TABLET | Freq: Two times a day (BID) | ORAL | Status: DC
  Administered 2019-09-13: 10:00:00 40 mg via ORAL
  Filled 2019-09-12 (×2): qty 1

## 2019-09-12 MED ORDER — METFORMIN HCL 500 MG PO TABS
500.0000 mg | ORAL_TABLET | Freq: Two times a day (BID) | ORAL | Status: DC
  Administered 2019-09-13: 10:00:00 500 mg via ORAL
  Filled 2019-09-12: qty 1

## 2019-09-12 MED ORDER — ALLOPURINOL 300 MG PO TABS
300.0000 mg | ORAL_TABLET | Freq: Every day | ORAL | Status: DC
  Administered 2019-09-13 – 2019-09-18 (×6): 300 mg via ORAL
  Filled 2019-09-12 (×7): qty 1

## 2019-09-12 NOTE — H&P (Signed)
[facility] NAME: [name]    MR#:  [id number]  DATE OF BIRTH:  12/22/1950  DATE OF ADMISSION:  09/12/2019  PRIMARY CARE PHYSICIAN: [name], MD   REQUESTING/REFERRING PHYSICIAN: Dr Lavonia Drafts  CHIEF COMPLAINT:   Chief Complaint  Patient presents with  . Dizziness    HISTORY OF PRESENT ILLNESS:  [name]  is a 69 y.o. male with a known history of previous stroke, atrial fibrillation, hypertension diabetes presents with dizziness and headache going on for a couple hours.  He also states that he has had difficulty speaking for about a week but has no problem swallowing.  He states that he always has a little balance issues from prior stroke and he walks with a cane.  He has been having some weight loss.  He has blurry vision in both eyes.  Hospitalist services were contacted for further evaluation after code stroke was called.  Patient had a CT angio of the head and neck that did not show any acute stroke but did show vertebral artery stenosis bilaterally.  PAST MEDICAL HISTORY:   Past Medical History:  Diagnosis Date  . Arthritis   . Atrial fibrillation ([place])   . Cocaine abuse ([place])    + UDS on admission  . Diabetes mellitus without complication ([place])   . Gout current and history of  . History of deep vein thrombosis 2006  . History of tobacco abuse    has smoked for 50 years  . Hypertension   . Pulmonary embolism ([place]) 2010  . Stroke [facility]) 2017   affected speech and right side-uses cane    PAST SURGICAL HISTORY:   Past Surgical History:  Procedure Laterality Date  . COLONOSCOPY    . OLECRANON BURSECTOMY Left 04/02/2019   Procedure: OLECRANON BURSA;  Surgeon: Earnestine Leys, MD;  Location: [facility] ORS;  Service: Orthopedics;  Laterality: Left;  [name] VASCULAR SURGERY     Stent in leg    SOCIAL HISTORY:   Social History   Tobacco Use  . Smoking status: Current Every Day Smoker     Packs/day: 0.50    Years: 50.00    Pack years: 25.00    Types: Cigarettes  . Smokeless tobacco: Never Used  Substance Use Topics  . Alcohol use: No    FAMILY HISTORY:   Family History  Problem Relation Age of Onset  . Aneurysm Mother   . Diabetes Father     DRUG ALLERGIES:  No Known Allergies  REVIEW OF SYSTEMS:  CONSTITUTIONAL: No fever, fatigue or weakness.  Positive for weight loss. EYES: Positive for blurred vision bilaterally EARS, NOSE, AND THROAT: No tinnitus or ear pain. No sore throat.  Positive for runny nose RESPIRATORY: Some cough and shortness of breath.  No wheezing or hemoptysis.  CARDIOVASCULAR: No chest pain, orthopnea, edema.  GASTROINTESTINAL: No nausea, vomiting, diarrhea or abdominal pain. No blood in bowel movements.  Positive for constipation GENITOURINARY: No dysuria, hematuria.  ENDOCRINE: No polyuria, nocturia,  HEMATOLOGY: No anemia, easy bruising or bleeding SKIN: No rash or lesion. MUSCULOSKELETAL: No joint pain or arthritis.   NEUROLOGIC: No tingling, numbness, weakness.  PSYCHIATRY: No anxiety or depression.   MEDICATIONS AT HOME:   Prior to Admission medications   Medication Sig Start Date End Date Taking? Authorizing Provider  allopurinol (ZYLOPRIM) 300 MG tablet Take 300 mg by mouth daily. 04/30/19   [provider]  apixaban (ELIQUIS) 5 MG TABS tablet Take 1  tablet (5 mg total) by mouth 2 (two) times daily. 04/18/19   Ivor Costa, MD  colchicine 0.6 MG tablet Take 0.6 mg by mouth daily. 08/29/19   [provider]  cyclobenzaprine (FLEXERIL) 5 MG tablet Take 1 tablet (5 mg total) by mouth 3 (three) times daily as needed for muscle spasms. 04/11/19   Ivor Costa, MD  furosemide (LASIX) 40 MG tablet Take 1 tablet (40 mg total) by mouth daily for 30 days. Patient taking differently: Take 40 mg by mouth 2 (two) times daily.  06/24/18 03/18/20  Saundra Shelling, MD  gabapentin (NEURONTIN) 400 MG capsule Take 1 capsule (400 mg total)  by mouth 3 (three) times daily. 04/02/19   Earnestine Leys, MD  lidocaine (LIDODERM) 5 % Place 1 patch onto the skin every 12 (twelve) hours. Remove & Discard patch within 12 hours or as directed by MD 06/29/19 06/28/20  Letitia Neri L, PA-C  losartan (COZAAR) 50 MG tablet Take 50 mg by mouth every morning.  04/10/18 04/10/19  [provider]  metFORMIN (GLUCOPHAGE) 500 MG tablet Take 500 mg by mouth 2 (two) times daily with a meal.  04/12/17   [provider]  metoprolol tartrate (LOPRESSOR) 25 MG tablet Take 25 mg by mouth 2 (two) times daily.    [provider]  polyethylene glycol (MIRALAX / GLYCOLAX) 17 g packet Take 17 g by mouth daily as needed for mild constipation. 04/11/19   Ivor Costa, MD  senna (SENOKOT) 8.6 MG TABS tablet Take 1 tablet (8.6 mg total) by mouth 2 (two) times daily. 04/11/19   Ivor Costa, MD  simvastatin (ZOCOR) 20 MG tablet Take 20 mg by mouth at bedtime. 02/07/18   [provider]   Medication reconciliation still undergoing  VITAL SIGNS:  Blood pressure (!) 153/83, pulse 72, temperature 98.3 F (36.8 C), temperature source Oral, resp. rate 18, height 6\' 2"  (1.88 m), weight 97.1 kg, SpO2 99 %.  PHYSICAL EXAMINATION:  GENERAL:  69 y.o.-year-old patient lying in the bed with no acute distress.  EYES: Pupils equal, round, reactive to light and accommodation. No scleral icterus. Extraocular muscles intact.  HEENT: Head atraumatic, normocephalic. Oropharynx and nasopharynx clear.  Unable to look into right tympanic membrane secondary to wax.  Left tympanic membrane no erythema NECK:  Supple, no jugular venous distention. No thyroid enlargement, no tenderness.  LUNGS: Normal breath sounds bilaterally, no wheezing, rales,rhonchi or crepitation. No use of accessory muscles of respiration.  CARDIOVASCULAR: S1, S2 normal. No murmurs, rubs, or gallops.  ABDOMEN: Soft, nontender, nondistended. Bowel sounds present. No organomegaly or mass.   EXTREMITIES: No pedal edema, cyanosis, or clubbing.  NEUROLOGIC: Cranial nerves II through XII are intact.  Speech at times seems a little slurred.  Muscle strength 5/5 in all extremities. Sensation intact. Gait not checked.  Slow with finger nose bilaterally but able to do it.  Slow with heel shin but able to do it.  Able to do rapid finger touch bilaterally. PSYCHIATRIC: The patient is alert and oriented x 3.  SKIN: No rash, lesion, or ulcer.   LABORATORY PANEL:   CBC Recent Labs  Lab 09/12/19 1420  WBC 3.0*  HGB 11.7*  HCT 37.9*  PLT 152   ------------------------------------------------------------------------------------------------------------------  Chemistries  Recent Labs  Lab 09/12/19 1420  NA 142  K 3.2*  CL 106  CO2 28  GLUCOSE 155*  BUN 16  CREATININE 0.93  CALCIUM 8.8*  AST 24  ALT 17  ALKPHOS 72  BILITOT 0.7   ------------------------------------------------------------------------------------------------------------------  RADIOLOGY:  CT ANGIO HEAD W OR WO CONTRAST  Result Date: 09/12/2019 CLINICAL DATA:  69 year old male code stroke presentation this afternoon. History of chronic cerebral infarcts with age indeterminate right cerebellar infarct on plain CT. EXAM: CT ANGIOGRAPHY HEAD AND NECK TECHNIQUE: Multidetector CT imaging of the head and neck was performed using the standard protocol during bolus administration of intravenous contrast. Multiplanar CT image reconstructions and MIPs were obtained to evaluate the vascular anatomy. Carotid stenosis measurements (when applicable) are obtained utilizing NASCET criteria, using the distal internal carotid diameter as the denominator. CONTRAST:  94mL OMNIPAQUE IOHEXOL 350 MG/ML SOLN COMPARISON:  Plain head CT 1409 hours today. Carotid Doppler ultrasound 10/03/2016. Head MRA 10/04/2016. FINDINGS: CTA NECK Skeleton: Diffuse idiopathic skeletal hyperostosis (DISH). Bulky and flowing osteophytes throughout the  visible spine. Absent maxillary dentition. No acute osseous abnormality identified. Upper chest: Mild upper lung scarring. Upper limits of normal superior mediastinal lymph nodes. Other neck: 2 cm anterior right thyroid lobe hypodense nodule which meets size criteria for ultrasound follow-up (ref: J Am Coll Radiol. 2015 Feb;12(2): 143-50).The glottis is closed. Partial effaced pharynx also. Negative for neck mass or lymphadenopathy. Aortic arch: 3 vessel arch configuration with mild arch atherosclerosis. Right carotid system: Mild brachiocephalic atherosclerosis without stenosis. Negative right CCA origin. Mild calcified plaque proximal to the bifurcation. Moderate mixed soft and calcified plaque at the right ICA origin and bulb, but less than 50 % stenosis with respect to the distal vessel. Tortuous right ICA just below the skull. Left carotid system: Soft and calcified plaque at the left CCA origin with less than 50 % stenosis with respect to the distal vessel. Occasional mild calcified plaque proximal to the bifurcation. Bulky calcified plaque at the left ICA origin and bulb with up to 50 % stenosis with respect to the distal vessel. Tortuous left ICA just below the skull. Vertebral arteries: Proximal right subclavian artery plaque without stenosis. The right vertebral artery origin is difficult to identify with suspected at least moderate stenosis on series 7, image 179. The right vertebral is somewhat diminutive but otherwise patent to the skull base without stenosis. Mild proximal left subclavian artery plaque without stenosis. Dominant left vertebral artery. Similar to the right side evidence of moderate to severe left vertebral artery origin stenosis due to atherosclerosis on series 8, image 160. Some of the V1 segment is obscured by dense paravertebral venous contrast. But the left vertebral artery remains dominant and patent to the skull base without additional stenosis. CTA HEAD Posterior circulation:  Mild left V4 calcified plaque without stenosis. Non dominant right vertebral artery functionally terminates in PICA. Both PICA vessels appear to be patent. Tortuous left vertebrobasilar junction and basilar artery. No basilar stenosis. Patent SCA and PCA origins. Posterior communicating arteries are diminutive or absent. Bilateral PCA branches are within normal limits, the left mildly asymmetrically smaller in the setting of chronic left distal PCA infarct. Anterior circulation: Both ICA siphons are patent and tortuous. Bilateral siphon calcified plaque does not result in significant stenosis. Patent carotid termini, MCA and ACA origins. Codominant A1 segments. Anterior communicating artery and bilateral ACA branches are within normal limits. Left MCA M1 segment and bifurcation are patent without stenosis. Left MCA branches are within normal limits. Right MCA M1 segment and bifurcation are patent without stenosis. Right MCA branches are within normal limits. Venous sinuses: Patent. Anatomic variants: Dominant left vertebral artery which primarily supplies the basilar. Review of the MIP images confirms the above findings IMPRESSION: 1. Negative for large vessel  occlusion. 2. 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 The left vertebral artery is dominant and primarily supplies the basilar without additional stenosis. Both PICA vessels appear to remain patent. 3. Bilateral cervical and intracranial carotid atherosclerosis. Up to 50% stenosis at the left ICA origin, no hemodynamically significant stenosis elsewhere. 4. A 2 cm right thyroid lobe nodule which meets size criteria for Thyroid Ultrasound follow-up. 5. Diffuse idiopathic skeletal hyperostosis (DISH). Electronically Signed   By: Genevie Ann M.D.   On: 09/12/2019 14:59   CT ANGIO NECK W OR WO CONTRAST  Result Date: 09/12/2019 CLINICAL DATA:  69 year old male code stroke presentation this afternoon. History of chronic cerebral  infarcts with age indeterminate right cerebellar infarct on plain CT. EXAM: CT ANGIOGRAPHY HEAD AND NECK TECHNIQUE: Multidetector CT imaging of the head and neck was performed using the standard protocol during bolus administration of intravenous contrast. Multiplanar CT image reconstructions and MIPs were obtained to evaluate the vascular anatomy. Carotid stenosis measurements (when applicable) are obtained utilizing NASCET criteria, using the distal internal carotid diameter as the denominator. CONTRAST:  26mL OMNIPAQUE IOHEXOL 350 MG/ML SOLN COMPARISON:  Plain head CT 1409 hours today. Carotid Doppler ultrasound 10/03/2016. Head MRA 10/04/2016. FINDINGS: CTA NECK Skeleton: Diffuse idiopathic skeletal hyperostosis (DISH). Bulky and flowing osteophytes throughout the visible spine. Absent maxillary dentition. No acute osseous abnormality identified. Upper chest: Mild upper lung scarring. Upper limits of normal superior mediastinal lymph nodes. Other neck: 2 cm anterior right thyroid lobe hypodense nodule which meets size criteria for ultrasound follow-up (ref: J Am Coll Radiol. 2015 Feb;12(2): 143-50).The glottis is closed. Partial effaced pharynx also. Negative for neck mass or lymphadenopathy. Aortic arch: 3 vessel arch configuration with mild arch atherosclerosis. Right carotid system: Mild brachiocephalic atherosclerosis without stenosis. Negative right CCA origin. Mild calcified plaque proximal to the bifurcation. Moderate mixed soft and calcified plaque at the right ICA origin and bulb, but less than 50 % stenosis with respect to the distal vessel. Tortuous right ICA just below the skull. Left carotid system: Soft and calcified plaque at the left CCA origin with less than 50 % stenosis with respect to the distal vessel. Occasional mild calcified plaque proximal to the bifurcation. Bulky calcified plaque at the left ICA origin and bulb with up to 50 % stenosis with respect to the distal vessel. Tortuous left  ICA just below the skull. Vertebral arteries: Proximal right subclavian artery plaque without stenosis. The right vertebral artery origin is difficult to identify with suspected at least moderate stenosis on series 7, image 179. The right vertebral is somewhat diminutive but otherwise patent to the skull base without stenosis. Mild proximal left subclavian artery plaque without stenosis. Dominant left vertebral artery. Similar to the right side evidence of moderate to severe left vertebral artery origin stenosis due to atherosclerosis on series 8, image 160. Some of the V1 segment is obscured by dense paravertebral venous contrast. But the left vertebral artery remains dominant and patent to the skull base without additional stenosis. CTA HEAD Posterior circulation: Mild left V4 calcified plaque without stenosis. Non dominant right vertebral artery functionally terminates in PICA. Both PICA vessels appear to be patent. Tortuous left vertebrobasilar junction and basilar artery. No basilar stenosis. Patent SCA and PCA origins. Posterior communicating arteries are diminutive or absent. Bilateral PCA branches are within normal limits, the left mildly asymmetrically smaller in the setting of chronic left distal PCA infarct. Anterior circulation: Both ICA siphons are patent and tortuous. Bilateral siphon calcified plaque  does not result in significant stenosis. Patent carotid termini, MCA and ACA origins. Codominant A1 segments. Anterior communicating artery and bilateral ACA branches are within normal limits. Left MCA M1 segment and bifurcation are patent without stenosis. Left MCA branches are within normal limits. Right MCA M1 segment and bifurcation are patent without stenosis. Right MCA branches are within normal limits. Venous sinuses: Patent. Anatomic variants: Dominant left vertebral artery which primarily supplies the basilar. Review of the MIP images confirms the above findings IMPRESSION: 1. Negative for large  vessel occlusion. 2. 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 The left vertebral artery is dominant and primarily supplies the basilar without additional stenosis. Both PICA vessels appear to remain patent. 3. Bilateral cervical and intracranial carotid atherosclerosis. Up to 50% stenosis at the left ICA origin, no hemodynamically significant stenosis elsewhere. 4. A 2 cm right thyroid lobe nodule which meets size criteria for Thyroid Ultrasound follow-up. 5. Diffuse idiopathic skeletal hyperostosis (DISH). Electronically Signed   By: Genevie Ann M.D.   On: 09/12/2019 14:59   CT HEAD CODE STROKE WO CONTRAST  Result Date: 09/12/2019 CLINICAL DATA:  Code stroke. EXAM: CT HEAD WITHOUT CONTRAST TECHNIQUE: Contiguous axial images were obtained from the base of the skull through the vertex without intravenous contrast. COMPARISON:  2019 FINDINGS: Brain: There is no acute intracranial hemorrhage, mass effect, or edema. There is no new loss of gray-white differentiation. Chronic left MCA territory infarction involving the frontoparietal lobes and insula again identified. Additional chronic left occipital infarct is again seen. There is a new small infarct of the superior right cerebellum. Otherwise, patchy hypoattenuation in the supratentorial white matter is nonspecific but may reflect stable chronic microvascular ischemic changes. The ventricles and sulci are within normal limits in size and configuration apart from volume loss associated with infarcts. Vascular: There is no hyperdense vessel. Mild intracranial atherosclerotic calcification is present at the skull base. Skull: Unremarkable. Sinuses/Orbits: No acute abnormality. Relative prominence of the left superior ophthalmic vein may reflect a varix. Other: Mastoid air cells are clear. ASPECTS ([name] Stroke Program Early CT Score) - Ganglionic level infarction (caudate, lentiform nuclei, internal capsule, insula, M1-M3 cortex): 7 -  Supraganglionic infarction (M4-M6 cortex): 3 Total score (0-10 with 10 being normal): 10, accounting for chronic infarct IMPRESSION: No acute intracranial hemorrhage or evidence of acute infarction. ASPECT score is 10 accounting for chronic infarct. New small infarct of the right cerebellum is favored to be chronic but age indeterminate. These results were called by telephone at the time of interpretation on 09/12/2019 at 2:17 pm to provider Lavonia Drafts , who verbally acknowledged these results. Electronically Signed   By: [name] M.D.   On: 09/12/2019 14:25    EKG:   Interpreted by me.  Atrial fibrillation 68 bpm, LVH.  IMPRESSION AND PLAN:   1.  Dizziness, headache, slurred speech.  MRI of the brain to rule out stroke from posterior circulation.  CT angio shows bilateral vertebral stenosis.  Patient already on Eliquis.  Change Zocor over to Lipitor and check a lipid profile.  Appreciate neurology consultation.  PT, OT, speech therapy consultations. 2.  Atrial fibrillation on metoprolol for rate control I will decrease the dose and continue Eliquis 3.  Essential hypertension hold other antihypertensive medications this evening and decrease metoprolol dose. 4.  Type 2 diabetes mellitus with hyperlipidemia change Zocor over to Lipitor and check lipid profile.  Put on sliding scale.  Check a hemoglobin A1c. 5.  History of gout  on allopurinol and colchicine   All the records, laboratory and radiological data are reviewed and case discussed with ED provider. Management plans discussed with the patient, and patient's friend as patient request and they are in agreement.  CODE STATUS: full code  TOTAL TIME TAKING CARE OF THIS PATIENT: 50 minutes.    Loletha [name] M.D on 09/12/2019 at 4:08 PM  Between 7am to 6pm - Pager - [phone]  After 6pm call admission pager [phone]  [facility]  CC: Primary care physician; [name], MD

## 2019-09-12 NOTE — ED Notes (Signed)
Patient returned from MRI. Patient on phone to update family.

## 2019-09-12 NOTE — ED Notes (Signed)
Patient back from re-peat ct head r/o LVO

## 2019-09-12 NOTE — ED Notes (Signed)
Code stroke called to Secretary at 14:05

## 2019-09-12 NOTE — Progress Notes (Signed)
CODE STROKE- PHARMACY COMMUNICATION   Time CODE STROKE called/page received: 1411  Time response to CODE STROKE was made (in person or via phone): 1411  Time Stroke Kit retrieved from [place] (only if needed): NA  Name of Provider/Nurse contacted: Dr. Doy Mince - no tPA, patient on [place] ,PharmD Clinical Pharmacist  09/12/2019  2:27 PM

## 2019-09-12 NOTE — ED Provider Notes (Signed)
[facility] Emergency Department Provider Note   ____________________________________________    I have reviewed the triage vital signs and the nursing notes.   HISTORY  Chief Complaint Dizziness     HPI [name] is a 69 y.o. male with a history of atrial fibrillation, diabetes, hypertension, CVA presents with complaints of dizziness and headache which started relatively abruptly approximately 1 hour prior to arrival.  He does have prior deficits from prior CVA including affected speech and weakness on the right side.  Is also on blood thinners.  No fevers chills or neck pain.  No trauma or falls.  Denies drug use.  Has not taken anything for this.  Past Medical History:  Diagnosis Date  . Arthritis   . Atrial fibrillation ([place])   . Cocaine abuse ([place])    + UDS on admission  . Diabetes mellitus without complication ([place])   . Gout current and history of  . History of deep vein thrombosis 2006  . History of tobacco abuse    has smoked for 50 years  . Hypertension   . Pulmonary embolism ([place]) 2010  . Stroke [facility]) 2017   affected speech and right side-uses cane    Patient Active Problem List   Diagnosis Date Noted  . PAD (peripheral artery disease) ([place]) 05/20/2019  . Atrial fibrillation, chronic ([place]) 04/08/2019  . Fever 04/08/2019  . Sepsis ([place]) 04/08/2019  . Abdominal pain 04/07/2019  . 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 Pain due to onychomycosis of toenails of both feet 11/20/2018  . Diabetic neuropathy ([place]) 11/20/2018  . Influenza A 06/22/2018  . Community acquired pneumonia 06/22/2018  . Arthritis 05/12/2017  . Varicose veins of leg with swelling, bilateral 01/18/2017  . Facial twitching   . 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 Pneumonia 10/12/2014  . Osteoarthritis of right knee 02/11/2014  . Pain and swelling of knee 01/08/2014  . Pain in extremity 01/08/2014  .  Weakness due to cerebrovascular accident 01/08/2014  . Pulmonary embolism ([place]) 06/19/2012  . Gout 09/22/2011  . 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 CVA (cerebral infarction) 04/26/2011  . Diabetes mellitus without complication ([place]) 47/42/5956  . 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 Anemia 04/26/2011  . DVT (deep venous thrombosis) ([place]) 04/26/2011  . Essential (primary) hypertension 04/26/2011    Past Surgical History:  Procedure Laterality Date  . COLONOSCOPY    . OLECRANON BURSECTOMY Left 04/02/2019   Procedure: OLECRANON BURSA;  Surgeon: Earnestine Leys, MD;  Location: [facility] ORS;  Service: Orthopedics;  Laterality: Left;  [name] VASCULAR SURGERY     Stent in leg    Prior to Admission medications   Medication Sig Start Date End Date Taking? Authorizing Provider  allopurinol (ZYLOPRIM) 300 MG tablet Take 300 mg by mouth daily. 04/30/19   [provider]  apixaban (ELIQUIS) 5 MG TABS tablet Take 1 tablet (5 mg total) by mouth 2 (two) times daily. 04/18/19   Ivor Costa, MD  colchicine 0.6 MG tablet Take 0.6 mg by mouth daily. 08/29/19   [provider]  cyclobenzaprine (FLEXERIL) 5 MG tablet Take 1 tablet (5 mg total) by mouth 3 (three) times daily as needed for muscle spasms. 04/11/19   Ivor Costa, MD  doxycycline (VIBRA-TABS) 100 MG tablet Take 100 mg by mouth 2 (two) times daily. 04/20/19   [provider]  furosemide (LASIX) 40 MG tablet Take 1 tablet (40 mg total) by  mouth daily for 30 days. Patient taking differently: Take 40 mg by mouth 2 (two) times daily.  06/24/18 03/18/20  Saundra Shelling, MD  gabapentin (NEURONTIN) 400 MG capsule Take 1 capsule (400 mg total) by mouth 3 (three) times daily. 04/02/19   Earnestine Leys, MD  HYDROcodone-acetaminophen (NORCO) 5-325 MG tablet Take 1-2 tablets by mouth every 6 (six) hours as needed. 04/02/19   Earnestine Leys, MD  lidocaine (LIDODERM) 5 % Place 1 patch onto the skin every 12 (twelve) hours. Remove  & Discard patch within 12 hours or as directed by MD 06/29/19 06/28/20  Letitia Neri L, PA-C  losartan (COZAAR) 50 MG tablet Take 50 mg by mouth every morning.  04/10/18 04/10/19  [provider]  metFORMIN (GLUCOPHAGE) 500 MG tablet Take 500 mg by mouth 2 (two) times daily with a meal.  04/12/17   [provider]  metoprolol tartrate (LOPRESSOR) 25 MG tablet Take 25 mg by mouth 2 (two) times daily.    [provider]  polyethylene glycol (MIRALAX / GLYCOLAX) 17 g packet Take 17 g by mouth daily as needed for mild constipation. 04/11/19   Ivor Costa, MD  predniSONE (DELTASONE) 10 MG tablet Take 40 mg by mouth daily. 04/21/19   [provider]  senna (SENOKOT) 8.6 MG TABS tablet Take 1 tablet (8.6 mg total) by mouth 2 (two) times daily. 04/11/19   Ivor Costa, MD  simvastatin (ZOCOR) 20 MG tablet Take 20 mg by mouth at bedtime. 02/07/18   [provider]  traMADol (ULTRAM) 50 MG tablet Take 50 mg by mouth every 6 (six) hours. 05/15/19   [provider]     Allergies Patient has no known allergies.  Family History  Problem Relation Age of Onset  . Aneurysm Mother   . Diabetes Father     Social History Social History   Tobacco Use  . Smoking status: Current Every Day Smoker    Packs/day: 0.50    Years: 50.00    Pack years: 25.00    Types: Cigarettes  . Smokeless tobacco: Never Used  Substance Use Topics  . Alcohol use: No  . Drug use: No    Comment: [facility] drug screen + cocaine on admit-last used cocaine in 2015    Review of Systems  Constitutional: No fever/chills Eyes: No visual changes.  ENT: No sore throat. Cardiovascular: Denies chest pain. Respiratory: Denies shortness of breath. Gastrointestinal:  No nausea, no vomiting.   Genitourinary: Negative for dysuria. Musculoskeletal: Negative for back pain. Skin: Negative for rash. Neurological: As above   ____________________________________________   PHYSICAL  EXAM:  VITAL SIGNS: ED Triage Vitals  Enc Vitals Group     BP 09/12/19 1359 (!) 156/73     Pulse Rate 09/12/19 1359 72     Resp 09/12/19 1359 18     Temp 09/12/19 1359 98 F (36.7 C)     Temp Source 09/12/19 1422 Oral     SpO2 09/12/19 1359 100 %     Weight 09/12/19 1359 97.1 kg (214 lb)     Height 09/12/19 1359 1.88 m (6\' 2" )     Head Circumference --      Peak Flow --      Pain Score 09/12/19 1359 10     Pain Loc --      Pain Edu? --      Excl. in [place]? --     Constitutional: Alert and oriented.  Eyes: Conjunctivae are normal.  Head: Atraumatic. Nose: No  congestion/rhinnorhea. Mouth/Throat: Mucous membranes are moist.    Cardiovascular: Normal rate, irregular rhythm. Grossly normal heart sounds.  Good peripheral circulation. Respiratory: Normal respiratory effort.  No retractions. Gastrointestinal: Soft and nontender. No distention.    Musculoskeletal: No lower extremity tenderness nor edema.  Warm and well perfused Neurologic: Mild dysarthria, chronic.  Chronic weakness on the right Skin:  Skin is warm, dry and intact. No rash noted. Psychiatric: Mood and affect are normal. Speech and behavior are normal.  ____________________________________________   LABS (all labs ordered are listed, but only abnormal results are displayed)  Labs Reviewed  GLUCOSE, CAPILLARY - Abnormal; Notable for the following components:      Result Value   Glucose-Capillary 162 (*)    All other components within normal limits  CBC - Abnormal; Notable for the following components:   WBC 3.0 (*)    Hemoglobin 11.7 (*)    HCT 37.9 (*)    MCH 25.2 (*)    RDW 18.3 (*)    All other components within normal limits  DIFFERENTIAL - Abnormal; Notable for the following components:   Neutro Abs 1.6 (*)    All other components within normal limits  COMPREHENSIVE METABOLIC PANEL - Abnormal; Notable for the following components:   Potassium 3.2 (*)    Glucose, Bld 155 (*)    Calcium 8.8 (*)     All other components within normal limits  RESPIRATORY PANEL BY RT PCR (FLU A&B, COVID)  PROTIME-INR  APTT  I-STAT CREATININE, ED  CBG MONITORING, ED   ____________________________________________  EKG  ED ECG REPORT I, Lavonia Drafts, the attending physician, personally viewed and interpreted this ECG.  Date: 09/12/2019  Rhythm: Atrial fibrillation QRS Axis: normal Intervals: Abnormal ST/T Wave abnormalities: normal Narrative Interpretation: no evidence of acute ischemia  ____________________________________________  RADIOLOGY  CT head no acute change CT angiography demonstrates vertebral stenosis ____________________________________________   PROCEDURES  Procedure(s) performed: No  Procedures   Critical Care performed: yes  CRITICAL CARE Performed by: Lavonia Drafts   Total critical care time: 30 minutes  Critical care time was exclusive of separately billable procedures and treating other patients.  Critical care was necessary to treat or prevent imminent or life-threatening deterioration.  Critical care was time spent personally by me on the following activities: development of treatment plan with patient and/or surrogate as well as nursing, discussions with consultants, evaluation of patient's response to treatment, examination of patient, obtaining history from patient or surrogate, ordering and performing treatments and interventions, ordering and review of laboratory studies, ordering and review of radiographic studies, pulse oximetry and re-evaluation of patient's condition.  ____________________________________________   INITIAL IMPRESSION / ASSESSMENT AND PLAN / ED COURSE  Pertinent labs & imaging results that were available during my care of the patient were reviewed by me and considered in my medical decision making (see chart for details).  Patient presents with abrupt onset dizziness, difficulty walking, headache.  History of CVA, diabetes,  hypertension.  Code stroke called and patient seen rapidly by Dr. Doy Mince of neurology, greatly appreciate the consult.  CT head and CT angiography demonstrate vertebral stenosis that is significant.  Dr. Doy Mince recommends avoiding aggressive blood pressure control at this time.  She notes patient should be admitted to the hospitalist service for MRI and further work-up.  tPA considered but patient is on Eliquis and NIH stroke scale of 3.  Dr. Doy Mince recommends no TPA at this time  Discussed with hospitalist service for admission    ____________________________________________  FINAL CLINICAL IMPRESSION(S) / ED DIAGNOSES  Final diagnoses:  Cerebrovascular accident (CVA), unspecified mechanism ([place])        Note:  This document was prepared using Dragon voice recognition software and may include unintentional dictation errors.   Lavonia Drafts, MD 09/12/19 1531

## 2019-09-12 NOTE — ED Notes (Signed)
Patient sitting up in bed having dinner. [id number]

## 2019-09-12 NOTE — ED Notes (Signed)
Report received from end-shift RN. Patient care assumed. Patient/RN introduction complete. Will continue to monitor. Pt awaiting admission bed, resting comfortably, alert and oriented. Co headache rates it 8/10, IV to LAC intact.

## 2019-09-12 NOTE — Progress Notes (Signed)
[place] responded to page for [place]; pt. in bed when [facility] arrived, being attended by medical team; neurologist arrived shortly after --> performed stroke eval.  CH spoke briefly w/pt.; Pt. says he was brought to ED by nephew; no other needs at this time.      09/12/19 1420  Clinical Encounter Type  Visited With Patient  Visit Type Initial;ED;Other (Comment) (CODE STROKE)  Referral From Nurse  Stress Factors  Patient Stress Factors Health changes

## 2019-09-12 NOTE — ED Notes (Signed)
Report called to [facility] RN pt admitted to 136 via stretcher.

## 2019-09-12 NOTE — ED Notes (Signed)
Attempted to call report, nurse unavailable at this time.

## 2019-09-12 NOTE — ED Notes (Signed)
Pt arrived to CT room

## 2019-09-12 NOTE — Consult Note (Signed)
Requesting Physician: Corky Downs    Chief Complaint: Dizziness  I have been asked by Dr. Corky Downs to see this patient in consultation for code stroke.  HPI: [name] is an 69 y.o. male with medical history significant of atrial fibrillation on Eliquis, diabetes mellitus, hypertension who presents with acute onset of dizziness.  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  Patient reports that he was eating and had acute onset of dizziness.  Also reports headache and blurred vision that occurred at the same time.  Patient took 2 Tylenol and was brought in for evaluation.  Initial NIHSS of 3 (for chronic deficits).  Date last known well: 09/12/2019 Time last known well: Time: 13:00 tPA Given: No: Patient on Eliquis and compliant  Past Medical History:  Diagnosis Date  . Arthritis   . Atrial fibrillation ([place])   . Cocaine abuse ([place])    + UDS on admission  . Diabetes mellitus without complication ([place])   . Gout current and history of  . History of deep vein thrombosis 2006  . History of tobacco abuse    has smoked for 50 years  . Hypertension   . Pulmonary embolism ([place]) 2010  . Stroke [facility]) 2017   affected speech and right side-uses cane    Past Surgical History:  Procedure Laterality Date  . COLONOSCOPY    . OLECRANON BURSECTOMY Left 04/02/2019   Procedure: OLECRANON BURSA;  Surgeon: Earnestine Leys, MD;  Location: [facility] ORS;  Service: Orthopedics;  Laterality: Left;  [name] VASCULAR SURGERY     Stent in leg    Family History  Problem Relation Age of Onset  . Aneurysm Mother   . Diabetes Father    Social History:  reports that he has been smoking cigarettes. He has a 25.00 pack-year smoking history. He has never used smokeless tobacco. He reports that he does not drink alcohol or use drugs.  Allergies: No Known Allergies  Medications: I have reviewed the patient's current medications. Prior to Admission medications    Medication Sig Start Date End Date Taking? Authorizing Provider  allopurinol (ZYLOPRIM) 300 MG tablet Take 300 mg by mouth daily. 04/30/19   [provider]  apixaban (ELIQUIS) 5 MG TABS tablet Take 1 tablet (5 mg total) by mouth 2 (two) times daily. 04/18/19   Ivor Costa, MD  colchicine 0.6 MG tablet Take 0.6 mg by mouth daily. 08/29/19   [provider]  cyclobenzaprine (FLEXERIL) 5 MG tablet Take 1 tablet (5 mg total) by mouth 3 (three) times daily as needed for muscle spasms. 04/11/19   Ivor Costa, MD  doxycycline (VIBRA-TABS) 100 MG tablet Take 100 mg by mouth 2 (two) times daily. 04/20/19   [provider]  furosemide (LASIX) 40 MG tablet Take 1 tablet (40 mg total) by mouth daily for 30 days. Patient taking differently: Take 40 mg by mouth 2 (two) times daily.  06/24/18 03/18/20  Saundra Shelling, MD  gabapentin (NEURONTIN) 400 MG capsule Take 1 capsule (400 mg total) by mouth 3 (three) times daily. 04/02/19   Earnestine Leys, MD  HYDROcodone-acetaminophen (NORCO) 5-325 MG tablet Take 1-2 tablets by mouth every 6 (six) hours as needed. 04/02/19   Earnestine Leys, MD  lidocaine (LIDODERM) 5 % Place 1 patch onto the skin every 12 (twelve) hours. Remove & Discard patch within 12 hours or as directed by MD 06/29/19 06/28/20  Letitia Neri L, PA-C  losartan (COZAAR) 50 MG tablet Take 50 mg  by mouth every morning.  04/10/18 04/10/19  [provider]  metFORMIN (GLUCOPHAGE) 500 MG tablet Take 500 mg by mouth 2 (two) times daily with a meal.  04/12/17   [provider]  metoprolol tartrate (LOPRESSOR) 25 MG tablet Take 25 mg by mouth 2 (two) times daily.    [provider]  polyethylene glycol (MIRALAX / GLYCOLAX) 17 g packet Take 17 g by mouth daily as needed for mild constipation. 04/11/19   Ivor Costa, MD  predniSONE (DELTASONE) 10 MG tablet Take 40 mg by mouth daily. 04/21/19   [provider]  senna (SENOKOT) 8.6 MG TABS tablet Take 1 tablet  (8.6 mg total) by mouth 2 (two) times daily. 04/11/19   Ivor Costa, MD  simvastatin (ZOCOR) 20 MG tablet Take 20 mg by mouth at bedtime. 02/07/18   [provider]  traMADol (ULTRAM) 50 MG tablet Take 50 mg by mouth every 6 (six) hours. 05/15/19   [provider]    ROS: History obtained from the patient  General ROS: negative for - chills, fatigue, fever, night sweats, weight gain or weight loss Psychological ROS: negative for - behavioral disorder, hallucinations, memory difficulties, mood swings or suicidal ideation Ophthalmic ROS: blurred vision ENT ROS: negative for - epistaxis, nasal discharge, oral lesions, sore throat, tinnitus  Allergy and Immunology ROS: negative for - hives or itchy/watery eyes Hematological and Lymphatic ROS: negative for - bleeding problems, bruising or swollen lymph nodes Endocrine ROS: negative for - galactorrhea, hair pattern changes, polydipsia/polyuria or temperature intolerance Respiratory ROS: negative for - cough, hemoptysis, shortness of breath or wheezing Cardiovascular ROS: BLE edema Gastrointestinal ROS: negative for - abdominal pain, diarrhea, hematemesis, nausea/vomiting or stool incontinence Genito-Urinary ROS: negative for - dysuria, hematuria, incontinence or urinary frequency/urgency Musculoskeletal ROS: negative for - joint swelling or muscular weakness Neurological ROS: as noted in HPI Dermatological ROS: negative for rash and skin lesion changes  Physical Examination: Blood pressure (!) 153/83, pulse 72, temperature 98.3 F (36.8 C), temperature source Oral, resp. rate 18, height 6\' 2"  (1.88 m), weight 97.1 kg, SpO2 99 %.  HEENT-  Normocephalic, no lesions, without obvious abnormality.  Normal external eye and conjunctiva.  Normal TM's bilaterally.  Normal auditory canals and external ears. Normal external nose, mucus membranes and septum.  Normal pharynx. Cardiovascular- S1, S2 normal, pulses palpable throughout    Lungs- chest clear, no wheezing, rales, normal symmetric air entry Abdomen- soft, non-tender; bowel sounds normal; no masses,  no organomegaly Extremities- BLE edema Lymph-no adenopathy palpable Musculoskeletal-no joint tenderness, deformity or swelling Skin-warm and dry, no hyperpigmentation, vitiligo, or suspicious lesions  Neurological Examination   Mental Status: Alert, oriented, thought content appropriate.  Patient with some aphasia and dysarthria.  Able to follow 3 step commands without difficulty. Cranial Nerves: II: Visual fields grossly normal, pupils equal, round, reactive to light and accommodation.  Blurred vision in both eyes. III,IV, VI: ptosis not present, extra-ocular motions intact bilaterally V,VII: smile symmetric, facial light touch sensation normal bilaterally VIII: hearing normal bilaterally IX,X: gag reflex present XI: bilateral shoulder shrug XII: midline tongue extension Motor: Right : Upper extremity   5/5    Left:     Upper extremity   5/5  Lower extremity   4/5     Lower extremity   5/5 Tone and bulk:normal tone throughout; no atrophy noted Sensory: Pinprick and light touch intact throughout, bilaterally Deep Tendon Reflexes: Symmetric throughout Plantars: Right: upgoing   Left: upgoing Cerebellar: Normal finger-to-nose and normal  heel-to-shin testing bilaterally Gait: not tested due to safety concerns    Laboratory Studies:  Basic Metabolic Panel: Recent Labs  Lab 09/12/19 1420  NA 142  K 3.2*  CL 106  CO2 28  GLUCOSE 155*  BUN 16  CREATININE 0.93  CALCIUM 8.8*    Liver Function Tests: Recent Labs  Lab 09/12/19 1420  AST 24  ALT 17  ALKPHOS 72  BILITOT 0.7  PROT 6.6  ALBUMIN 3.6   No results for input(s): LIPASE, AMYLASE in the last 168 hours. No results for input(s): AMMONIA in the last 168 hours.  CBC: Recent Labs  Lab 09/12/19 1420  WBC 3.0*  NEUTROABS 1.6*  HGB 11.7*  HCT 37.9*  MCV 81.5  PLT 152    Cardiac  Enzymes: No results for input(s): CKTOTAL, CKMB, CKMBINDEX, TROPONINI in the last 168 hours.  BNP: Invalid input(s): POCBNP  CBG: Recent Labs  Lab 09/12/19 1402  GLUCAP 162*    Microbiology: Results for orders placed or performed during the hospital encounter of 04/07/19  SARS CORONAVIRUS 2 (TAT 6-24 HRS) Nasopharyngeal Nasopharyngeal Swab     Status: None   Collection Time: 04/07/19 11:34 AM   Specimen: Nasopharyngeal Swab  Result Value Ref Range Status   SARS Coronavirus 2 NEGATIVE NEGATIVE Final    Comment: (NOTE) SARS-CoV-2 target nucleic acids are NOT DETECTED. The SARS-CoV-2 RNA is generally detectable in upper and lower respiratory specimens during the acute phase of infection. Negative results do not preclude SARS-CoV-2 infection, do not rule out co-infections with other pathogens, and should not be used as the sole basis for treatment or other patient management decisions. Negative results must be combined with clinical observations, patient history, and epidemiological information. The expected result is Negative. Fact Sheet for Patients: SugarRoll.be Fact Sheet for Healthcare Providers: https://www.woods-mathews.com/ This test is not yet approved or cleared by the [place] FDA and  has been authorized for detection and/or diagnosis of SARS-CoV-2 by FDA under an Emergency Use Authorization (EUA). This EUA will remain  in effect (meaning this test can be used) for the duration of the COVID-19 declaration under Section 56 4(b)(1) of the Act, 21 U.S.C. section 360bbb-3(b)(1), unless the authorization is terminated or revoked sooner. Performed at [facility], [place] [address], [place], [place] [address]   CULTURE, BLOOD (ROUTINE X 2) w Reflex to ID Panel     Status: None   Collection Time: 04/08/19  8:48 AM   Specimen: BLOOD  Result Value Ref Range Status   Specimen Description BLOOD RIGHT ANTECUBITAL  Final    Special Requests   Final    BOTTLES DRAWN AEROBIC AND ANAEROBIC Blood Culture adequate volume   Culture   Final    NO GROWTH 5 DAYS Performed at [facility], [address] [place], [place] [address]    Report Status 04/13/2019 FINAL  Final  CULTURE, BLOOD (ROUTINE X 2) w Reflex to ID Panel     Status: None   Collection Time: 04/08/19  8:48 AM   Specimen: BLOOD  Result Value Ref Range Status   Specimen Description BLOOD BLOOD RIGHT HAND  Final   Special Requests   Final    BOTTLES DRAWN AEROBIC AND ANAEROBIC Blood Culture results may not be optimal due to an inadequate volume of blood received in culture bottles   Culture   Final    NO GROWTH 5 DAYS Performed at [facility], [address], [place], [place] [address]  Report Status 04/13/2019 FINAL  Final  Culture, expectorated sputum-assessment     Status: None   Collection Time: 04/08/19  7:26 PM   Specimen: Expectorated Sputum  Result Value Ref Range Status   Specimen Description EXPECTORATED SPUTUM  Final   Special Requests NONE  Final   Sputum evaluation   Final    Sputum specimen not acceptable for testing.  Please recollect.   RESULT CALLED TO, READ BACK BY AND VERIFIED WITH: DAWN SONGSTER ON 04/08/2019 AT 2108 TIK Performed at [facility], [address] [place], [place] [address]    Report Status 04/08/2019 FINAL  Final    Coagulation Studies: Recent Labs    09/12/19 1420  LABPROT 13.4  INR 1.1    Urinalysis: No results for input(s): COLORURINE, LABSPEC, PHURINE, GLUCOSEU, HGBUR, BILIRUBINUR, KETONESUR, PROTEINUR, UROBILINOGEN, NITRITE, LEUKOCYTESUR in the last 168 hours.  Invalid input(s): APPERANCEUR  Lipid Panel:    Component Value Date/Time   CHOL 129 10/04/2016 0438   CHOL 124 06/06/2012 0445   TRIG 89 10/04/2016 0438   TRIG 74 06/06/2012 0445   HDL 41 10/04/2016 0438   HDL 53 06/06/2012 0445   CHOLHDL 3.1 10/04/2016 0438   VLDL 18 10/04/2016 0438   VLDL 15  06/06/2012 0445   LDLCALC 70 10/04/2016 0438   LDLCALC 56 06/06/2012 0445    HgbA1C:  Lab Results  Component Value Date   HGBA1C 6.8 (H) 04/07/2019    Urine Drug Screen:      Component Value Date/Time   LABOPIA POSITIVE (A) 04/07/2019 1028   COCAINSCRNUR NONE DETECTED 04/07/2019 1028   LABBENZ NONE DETECTED 04/07/2019 1028   AMPHETMU NONE DETECTED 04/07/2019 1028   THCU NONE DETECTED 04/07/2019 1028   LABBARB NONE DETECTED 04/07/2019 1028    Alcohol Level: No results for input(s): ETH in the last 168 hours.  Other results: EKG: atrial fibrillation, rate 68 bpm.  Imaging: CT ANGIO HEAD W OR WO CONTRAST  Result Date: 09/12/2019 CLINICAL DATA:  69 year old male code stroke presentation this afternoon. History of chronic cerebral infarcts with age indeterminate right cerebellar infarct on plain CT. EXAM: CT ANGIOGRAPHY HEAD AND NECK TECHNIQUE: Multidetector CT imaging of the head and neck was performed using the standard protocol during bolus administration of intravenous contrast. Multiplanar CT image reconstructions and MIPs were obtained to evaluate the vascular anatomy. Carotid stenosis measurements (when applicable) are obtained utilizing NASCET criteria, using the distal internal carotid diameter as the denominator. CONTRAST:  58mL OMNIPAQUE IOHEXOL 350 MG/ML SOLN COMPARISON:  Plain head CT 1409 hours today. Carotid Doppler ultrasound 10/03/2016. Head MRA 10/04/2016. FINDINGS: CTA NECK Skeleton: Diffuse idiopathic skeletal hyperostosis (DISH). Bulky and flowing osteophytes throughout the visible spine. Absent maxillary dentition. No acute osseous abnormality identified. Upper chest: Mild upper lung scarring. Upper limits of normal superior mediastinal lymph nodes. Other neck: 2 cm anterior right thyroid lobe hypodense nodule which meets size criteria for ultrasound follow-up (ref: J Am Coll Radiol. 2015 Feb;12(2): 143-50).The glottis is closed. Partial effaced pharynx also. Negative  for neck mass or lymphadenopathy. Aortic arch: 3 vessel arch configuration with mild arch atherosclerosis. Right carotid system: Mild brachiocephalic atherosclerosis without stenosis. Negative right CCA origin. Mild calcified plaque proximal to the bifurcation. Moderate mixed soft and calcified plaque at the right ICA origin and bulb, but less than 50 % stenosis with respect to the distal vessel. Tortuous right ICA just below the skull. Left carotid system: Soft and calcified plaque at the left CCA origin with less than  50 % stenosis with respect to the distal vessel. Occasional mild calcified plaque proximal to the bifurcation. Bulky calcified plaque at the left ICA origin and bulb with up to 50 % stenosis with respect to the distal vessel. Tortuous left ICA just below the skull. Vertebral arteries: Proximal right subclavian artery plaque without stenosis. The right vertebral artery origin is difficult to identify with suspected at least moderate stenosis on series 7, image 179. The right vertebral is somewhat diminutive but otherwise patent to the skull base without stenosis. Mild proximal left subclavian artery plaque without stenosis. Dominant left vertebral artery. Similar to the right side evidence of moderate to severe left vertebral artery origin stenosis due to atherosclerosis on series 8, image 160. Some of the V1 segment is obscured by dense paravertebral venous contrast. But the left vertebral artery remains dominant and patent to the skull base without additional stenosis. CTA HEAD Posterior circulation: Mild left V4 calcified plaque without stenosis. Non dominant right vertebral artery functionally terminates in PICA. Both PICA vessels appear to be patent. Tortuous left vertebrobasilar junction and basilar artery. No basilar stenosis. Patent SCA and PCA origins. Posterior communicating arteries are diminutive or absent. Bilateral PCA branches are within normal limits, the left mildly asymmetrically  smaller in the setting of chronic left distal PCA infarct. Anterior circulation: Both ICA siphons are patent and tortuous. Bilateral siphon calcified plaque does not result in significant stenosis. Patent carotid termini, MCA and ACA origins. Codominant A1 segments. Anterior communicating artery and bilateral ACA branches are within normal limits. Left MCA M1 segment and bifurcation are patent without stenosis. Left MCA branches are within normal limits. Right MCA M1 segment and bifurcation are patent without stenosis. Right MCA branches are within normal limits. Venous sinuses: Patent. Anatomic variants: Dominant left vertebral artery which primarily supplies the basilar. Review of the MIP images confirms the above findings IMPRESSION: 1. Negative for large vessel occlusion. 2. 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 The left vertebral artery is dominant and primarily supplies the basilar without additional stenosis. Both PICA vessels appear to remain patent. 3. Bilateral cervical and intracranial carotid atherosclerosis. Up to 50% stenosis at the left ICA origin, no hemodynamically significant stenosis elsewhere. 4. A 2 cm right thyroid lobe nodule which meets size criteria for Thyroid Ultrasound follow-up. 5. Diffuse idiopathic skeletal hyperostosis (DISH). Electronically Signed   By: Genevie Ann M.D.   On: 09/12/2019 14:59   CT ANGIO NECK W OR WO CONTRAST  Result Date: 09/12/2019 CLINICAL DATA:  69 year old male code stroke presentation this afternoon. History of chronic cerebral infarcts with age indeterminate right cerebellar infarct on plain CT. EXAM: CT ANGIOGRAPHY HEAD AND NECK TECHNIQUE: Multidetector CT imaging of the head and neck was performed using the standard protocol during bolus administration of intravenous contrast. Multiplanar CT image reconstructions and MIPs were obtained to evaluate the vascular anatomy. Carotid stenosis measurements (when applicable) are  obtained utilizing NASCET criteria, using the distal internal carotid diameter as the denominator. CONTRAST:  68mL OMNIPAQUE IOHEXOL 350 MG/ML SOLN COMPARISON:  Plain head CT 1409 hours today. Carotid Doppler ultrasound 10/03/2016. Head MRA 10/04/2016. FINDINGS: CTA NECK Skeleton: Diffuse idiopathic skeletal hyperostosis (DISH). Bulky and flowing osteophytes throughout the visible spine. Absent maxillary dentition. No acute osseous abnormality identified. Upper chest: Mild upper lung scarring. Upper limits of normal superior mediastinal lymph nodes. Other neck: 2 cm anterior right thyroid lobe hypodense nodule which meets size criteria for ultrasound follow-up (ref: J Am Coll Radiol. 2015 Feb;12(2):  143-50).The glottis is closed. Partial effaced pharynx also. Negative for neck mass or lymphadenopathy. Aortic arch: 3 vessel arch configuration with mild arch atherosclerosis. Right carotid system: Mild brachiocephalic atherosclerosis without stenosis. Negative right CCA origin. Mild calcified plaque proximal to the bifurcation. Moderate mixed soft and calcified plaque at the right ICA origin and bulb, but less than 50 % stenosis with respect to the distal vessel. Tortuous right ICA just below the skull. Left carotid system: Soft and calcified plaque at the left CCA origin with less than 50 % stenosis with respect to the distal vessel. Occasional mild calcified plaque proximal to the bifurcation. Bulky calcified plaque at the left ICA origin and bulb with up to 50 % stenosis with respect to the distal vessel. Tortuous left ICA just below the skull. Vertebral arteries: Proximal right subclavian artery plaque without stenosis. The right vertebral artery origin is difficult to identify with suspected at least moderate stenosis on series 7, image 179. The right vertebral is somewhat diminutive but otherwise patent to the skull base without stenosis. Mild proximal left subclavian artery plaque without stenosis. Dominant left  vertebral artery. Similar to the right side evidence of moderate to severe left vertebral artery origin stenosis due to atherosclerosis on series 8, image 160. Some of the V1 segment is obscured by dense paravertebral venous contrast. But the left vertebral artery remains dominant and patent to the skull base without additional stenosis. CTA HEAD Posterior circulation: Mild left V4 calcified plaque without stenosis. Non dominant right vertebral artery functionally terminates in PICA. Both PICA vessels appear to be patent. Tortuous left vertebrobasilar junction and basilar artery. No basilar stenosis. Patent SCA and PCA origins. Posterior communicating arteries are diminutive or absent. Bilateral PCA branches are within normal limits, the left mildly asymmetrically smaller in the setting of chronic left distal PCA infarct. Anterior circulation: Both ICA siphons are patent and tortuous. Bilateral siphon calcified plaque does not result in significant stenosis. Patent carotid termini, MCA and ACA origins. Codominant A1 segments. Anterior communicating artery and bilateral ACA branches are within normal limits. Left MCA M1 segment and bifurcation are patent without stenosis. Left MCA branches are within normal limits. Right MCA M1 segment and bifurcation are patent without stenosis. Right MCA branches are within normal limits. Venous sinuses: Patent. Anatomic variants: Dominant left vertebral artery which primarily supplies the basilar. Review of the MIP images confirms the above findings IMPRESSION: 1. Negative for large vessel occlusion. 2. 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 The left vertebral artery is dominant and primarily supplies the basilar without additional stenosis. Both PICA vessels appear to remain patent. 3. Bilateral cervical and intracranial carotid atherosclerosis. Up to 50% stenosis at the left ICA origin, no hemodynamically significant stenosis elsewhere. 4. A  2 cm right thyroid lobe nodule which meets size criteria for Thyroid Ultrasound follow-up. 5. Diffuse idiopathic skeletal hyperostosis (DISH). Electronically Signed   By: Genevie Ann M.D.   On: 09/12/2019 14:59   CT HEAD CODE STROKE WO CONTRAST  Result Date: 09/12/2019 CLINICAL DATA:  Code stroke. EXAM: CT HEAD WITHOUT CONTRAST TECHNIQUE: Contiguous axial images were obtained from the base of the skull through the vertex without intravenous contrast. COMPARISON:  2019 FINDINGS: Brain: There is no acute intracranial hemorrhage, mass effect, or edema. There is no new loss of gray-white differentiation. Chronic left MCA territory infarction involving the frontoparietal lobes and insula again identified. Additional chronic left occipital infarct is again seen. There is a new small infarct of the  superior right cerebellum. Otherwise, patchy hypoattenuation in the supratentorial white matter is nonspecific but may reflect stable chronic microvascular ischemic changes. The ventricles and sulci are within normal limits in size and configuration apart from volume loss associated with infarcts. Vascular: There is no hyperdense vessel. Mild intracranial atherosclerotic calcification is present at the skull base. Skull: Unremarkable. Sinuses/Orbits: No acute abnormality. Relative prominence of the left superior ophthalmic vein may reflect a varix. Other: Mastoid air cells are clear. ASPECTS ([place] Stroke Program Early CT Score) - Ganglionic level infarction (caudate, lentiform nuclei, internal capsule, insula, M1-M3 cortex): 7 - Supraganglionic infarction (M4-M6 cortex): 3 Total score (0-10 with 10 being normal): 10, accounting for chronic infarct IMPRESSION: No acute intracranial hemorrhage or evidence of acute infarction. ASPECT score is 10 accounting for chronic infarct. New small infarct of the right cerebellum is favored to be chronic but age indeterminate. These results were called by telephone at the time of  interpretation on 09/12/2019 at 2:17 pm to provider Lavonia Drafts , who verbally acknowledged these results. Electronically Signed   By: Macy Mis M.D.   On: 09/12/2019 14:25    Assessment: 69 y.o. male with medical history significant of atrial fibrillation on Eliquis, diabetes mellitus, hypertension who presents with acute onset of dizziness, headache and blurred vision.  Head CT personally reviewed and shows no acute changes.  CTA of the head and neck personally reviewed and shows no evidence of LVO.  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  PICAs appear patent.  Patient hypertensive.  Due to the possibility of some vertebrobasilar insufficiency, further observation recommended even though patient not considered a tPA or thrombectomy candidate.    Stroke Risk Factors - atrial fibrillation, diabetes mellitus and hypertension  Plan: 1. HgbA1c, fasting lipid panel 2. MRI of the brain without contrast 3. PT consult, OT consult, Speech consult 4. Echocardiogram 5. Prophylactic therapy-Continue Eliquis 6. NPO until RN stroke swallow screen 7. Telemetry monitoring 8. Frequent neuro checks 9 May use Ultram prn for headache abortion 10.  Would not be aggressive with BP control for the first 24 hours.     Alexis Goodell, MD Neurology [phone] 09/12/2019, 3:17 PM

## 2019-09-12 NOTE — ED Notes (Signed)
Pt arrived to ED 26 with RN at bedside

## 2019-09-12 NOTE — ED Triage Notes (Addendum)
Pt comes via POV from home with c/o dizziness and severe headache. Pt states this just started about an hour ago LKW was around 13:00. Pt states worse possible headache.  Pt states hx of stroke.  Pt denies any CP or SOB.  Pt states some blurry vision. Pt states he takes blood thinners. Pt states some weakness.

## 2019-09-13 ENCOUNTER — Observation Stay
Admit: 2019-09-13 | Discharge: 2019-09-13 | Disposition: A | Discharge status: Home / Self Care | Payer: Medicare Other | Attending: Internal Medicine | Admitting: Internal Medicine

## 2019-09-13 ENCOUNTER — Inpatient Hospital Stay: Payer: Medicare Other

## 2019-09-13 DIAGNOSIS — I472 Ventricular tachycardia: Secondary | ICD-10-CM

## 2019-09-13 DIAGNOSIS — Z86718 Personal history of other venous thrombosis and embolism: Secondary | ICD-10-CM | POA: Diagnosis not present

## 2019-09-13 DIAGNOSIS — I10 Essential (primary) hypertension: Secondary | ICD-10-CM | POA: Diagnosis present

## 2019-09-13 DIAGNOSIS — N179 Acute kidney failure, unspecified: Secondary | ICD-10-CM | POA: Diagnosis not present

## 2019-09-13 DIAGNOSIS — I429 Cardiomyopathy, unspecified: Secondary | ICD-10-CM | POA: Diagnosis present

## 2019-09-13 DIAGNOSIS — R001 Bradycardia, unspecified: Secondary | ICD-10-CM

## 2019-09-13 DIAGNOSIS — I639 Cerebral infarction, unspecified: Secondary | ICD-10-CM | POA: Diagnosis not present

## 2019-09-13 DIAGNOSIS — Z7901 Long term (current) use of anticoagulants: Secondary | ICD-10-CM | POA: Diagnosis not present

## 2019-09-13 DIAGNOSIS — Z79899 Other long term (current) drug therapy: Secondary | ICD-10-CM | POA: Diagnosis not present

## 2019-09-13 DIAGNOSIS — G4730 Sleep apnea, unspecified: Secondary | ICD-10-CM | POA: Diagnosis present

## 2019-09-13 DIAGNOSIS — J449 Chronic obstructive pulmonary disease, unspecified: Secondary | ICD-10-CM | POA: Diagnosis present

## 2019-09-13 DIAGNOSIS — I959 Hypotension, unspecified: Secondary | ICD-10-CM | POA: Diagnosis present

## 2019-09-13 DIAGNOSIS — E876 Hypokalemia: Secondary | ICD-10-CM | POA: Diagnosis present

## 2019-09-13 DIAGNOSIS — I63531 Cerebral infarction due to unspecified occlusion or stenosis of right posterior cerebral artery: Secondary | ICD-10-CM | POA: Diagnosis present

## 2019-09-13 DIAGNOSIS — E119 Type 2 diabetes mellitus without complications: Secondary | ICD-10-CM | POA: Diagnosis not present

## 2019-09-13 DIAGNOSIS — R29705 NIHSS score 5: Secondary | ICD-10-CM | POA: Diagnosis present

## 2019-09-13 DIAGNOSIS — H543 Unqualified visual loss, both eyes: Secondary | ICD-10-CM

## 2019-09-13 DIAGNOSIS — E1169 Type 2 diabetes mellitus with other specified complication: Secondary | ICD-10-CM | POA: Diagnosis present

## 2019-09-13 DIAGNOSIS — E1140 Type 2 diabetes mellitus with diabetic neuropathy, unspecified: Secondary | ICD-10-CM | POA: Diagnosis present

## 2019-09-13 DIAGNOSIS — Z833 Family history of diabetes mellitus: Secondary | ICD-10-CM | POA: Diagnosis not present

## 2019-09-13 DIAGNOSIS — F17210 Nicotine dependence, cigarettes, uncomplicated: Secondary | ICD-10-CM | POA: Diagnosis present

## 2019-09-13 DIAGNOSIS — I69351 Hemiplegia and hemiparesis following cerebral infarction affecting right dominant side: Secondary | ICD-10-CM | POA: Diagnosis not present

## 2019-09-13 DIAGNOSIS — Z720 Tobacco use: Secondary | ICD-10-CM

## 2019-09-13 DIAGNOSIS — I63532 Cerebral infarction due to unspecified occlusion or stenosis of left posterior cerebral artery: Secondary | ICD-10-CM | POA: Diagnosis not present

## 2019-09-13 DIAGNOSIS — R42 Dizziness and giddiness: Secondary | ICD-10-CM | POA: Diagnosis present

## 2019-09-13 DIAGNOSIS — H18419 Arcus senilis, unspecified eye: Secondary | ICD-10-CM | POA: Diagnosis present

## 2019-09-13 DIAGNOSIS — I482 Chronic atrial fibrillation, unspecified: Secondary | ICD-10-CM | POA: Diagnosis present

## 2019-09-13 DIAGNOSIS — Z20822 Contact with and (suspected) exposure to covid-19: Secondary | ICD-10-CM | POA: Diagnosis present

## 2019-09-13 DIAGNOSIS — Z79891 Long term (current) use of opiate analgesic: Secondary | ICD-10-CM | POA: Diagnosis not present

## 2019-09-13 DIAGNOSIS — I4729 Other ventricular tachycardia: Secondary | ICD-10-CM

## 2019-09-13 DIAGNOSIS — E785 Hyperlipidemia, unspecified: Secondary | ICD-10-CM | POA: Diagnosis present

## 2019-09-13 LAB — LIPID PANEL
Cholesterol: 132 mg/dL (ref 0–200)
HDL: 50 mg/dL (ref >40)
LDL Cholesterol: 72 mg/dL (ref 0–99)
Total CHOL/HDL Ratio: 2.6 RATIO
Triglycerides: 48 mg/dL (ref <150)
VLDL: 10 mg/dL (ref 0–40)

## 2019-09-13 LAB — BASIC METABOLIC PANEL
Anion gap: 5 (ref 5–15)
BUN: 15 mg/dL (ref 8–23)
CO2: 27 mmol/L (ref 22–32)
Calcium: 8.6 mg/dL — ABNORMAL LOW (ref 8.9–10.3)
Chloride: 107 mmol/L (ref 98–111)
Creatinine, Ser: 0.95 mg/dL (ref 0.61–1.24)
GFR calc Af Amer: >60 mL/min (ref >60)
GFR calc non Af Amer: >60 mL/min (ref >60)
Glucose, Bld: 95 mg/dL (ref 70–99)
Potassium: 3.2 mmol/L — ABNORMAL LOW (ref 3.5–5.1)
Sodium: 139 mmol/L (ref 135–145)

## 2019-09-13 LAB — GLUCOSE, CAPILLARY
Glucose-Capillary: 101 mg/dL — ABNORMAL HIGH (ref 70–99)
Glucose-Capillary: 85 mg/dL (ref 70–99)
Glucose-Capillary: 93 mg/dL (ref 70–99)
Glucose-Capillary: 86 mg/dL (ref 70–99)

## 2019-09-13 LAB — ECHOCARDIOGRAM COMPLETE
Height: 74 in
Weight: 3424 oz

## 2019-09-13 MED ORDER — FUROSEMIDE 20 MG PO TABS
20.0000 mg | ORAL_TABLET | Freq: Every day | ORAL | Status: DC
  Administered 2019-09-14 – 2019-09-17 (×4): 20 mg via ORAL
  Filled 2019-09-13 (×5): qty 1

## 2019-09-13 MED ORDER — LOSARTAN POTASSIUM 50 MG PO TABS
100.0000 mg | ORAL_TABLET | Freq: Every day | ORAL | Status: DC
  Administered 2019-09-13 – 2019-09-17 (×5): 100 mg via ORAL
  Filled 2019-09-13 (×5): qty 2

## 2019-09-13 MED ORDER — POTASSIUM CHLORIDE CRYS ER 20 MEQ PO TBCR
40.0000 meq | EXTENDED_RELEASE_TABLET | Freq: Two times a day (BID) | ORAL | Status: DC
  Administered 2019-09-13 – 2019-09-18 (×11): 40 meq via ORAL
  Filled 2019-09-13 (×11): qty 2

## 2019-09-13 MED ORDER — AMLODIPINE BESYLATE 5 MG PO TABS
5.0000 mg | ORAL_TABLET | Freq: Every day | ORAL | Status: DC
  Administered 2019-09-13: 5 mg via ORAL
  Filled 2019-09-13: qty 1

## 2019-09-13 MED ORDER — MAGNESIUM SULFATE 2 GM/50ML IV SOLN
2.0000 g | Freq: Once | INTRAVENOUS | Status: AC
  Administered 2019-09-13: 2 g via INTRAVENOUS
  Filled 2019-09-13: qty 50

## 2019-09-13 NOTE — Progress Notes (Signed)
Subjective: Patient with no new neurological complaints.   Objective: Current vital signs: BP (!) 175/102 (BP Location: Right Arm)   Pulse (!) 59   Temp 97.8 F (36.6 C) (Oral)   Resp 16   Ht 6\' 2"  (1.88 m)   Wt 97.1 kg   SpO2 100%   BMI 27.48 kg/m  Vital signs in last 24 hours: Temp:  [97.5 F (36.4 C)-98.4 F (36.9 C)] 97.8 F (36.6 C) (04/29 1011) Pulse Rate:  [50-72] 59 (04/29 1046) Resp:  [12-20] 16 (04/29 1011) BP: (141-184)/(73-110) 175/102 (04/29 1046) SpO2:  [95 %-100 %] 100 % (04/29 1011) Weight:  [97.1 kg] 97.1 kg (04/28 1359)  Intake/Output from previous day: No intake/output data recorded. Intake/Output this shift: Total I/O In: -  Out: 250 [Urine:250] Nutritional status:  Diet Order            Diet heart healthy/carb modified Room service appropriate? Yes; Fluid consistency: Thin  Diet effective now              Neurologic Exam: Mental Status: Alert, oriented.  +aphasia.  +dysarthria Cranial Nerves: II: Blurred vision III,IV, VI: ptosis not present, extra-ocular motions intact bilaterally V,VII: smile symmetric, facial light touch sensation normal bilaterally VIII: hearing normal bilaterally IX,X: gag reflex present XI: bilateral shoulder shrug XII: midline tongue extension Motor: Right : Upper extremity   5/5    Left:     Upper extremity   5/5  Lower extremity   4/5     Lower extremity   5/5 Tone and bulk:normal tone throughout; no atrophy noted Sensory: Pinprick and light touch intact throughout, bilaterally Deep Tendon Reflexes: Symmetric throughout Plantars: Right: upgoing   Left: upgoing Cerebellar: Normal finger-to-nose and normal heel-to-shin testing bilaterally Gait: not tested due to safety concerns   Lab Results: Basic Metabolic Panel: Recent Labs  Lab 09/12/19 1420 09/13/19 0524  NA 142 139  K 3.2* 3.2*  CL 106 107  CO2 28 27  GLUCOSE 155* 95  BUN 16 15  CREATININE 0.93 0.95  CALCIUM 8.8* 8.6*    Liver Function  Tests: Recent Labs  Lab 09/12/19 1420  AST 24  ALT 17  ALKPHOS 72  BILITOT 0.7  PROT 6.6  ALBUMIN 3.6   No results for input(s): LIPASE, AMYLASE in the last 168 hours. No results for input(s): AMMONIA in the last 168 hours.  CBC: Recent Labs  Lab 09/12/19 1420  WBC 3.0*  NEUTROABS 1.6*  HGB 11.7*  HCT 37.9*  MCV 81.5  PLT 152    Cardiac Enzymes: No results for input(s): CKTOTAL, CKMB, CKMBINDEX, TROPONINI in the last 168 hours.  Lipid Panel: Recent Labs  Lab 09/13/19 0524  CHOL 132  TRIG 48  HDL 50  CHOLHDL 2.6  VLDL 10  LDLCALC 72    CBG: Recent Labs  Lab 09/12/19 1402 09/12/19 1617 09/12/19 2209 09/13/19 0759 09/13/19 1132  GLUCAP 162* 82 100* 101* 85    Microbiology: Results for orders placed or performed during the hospital encounter of 09/12/19  Respiratory Panel by RT PCR (Flu A&B, Covid) - Nasopharyngeal Swab     Status: None   Collection Time: 09/12/19  3:20 PM   Specimen: Nasopharyngeal Swab  Result Value Ref Range Status   SARS Coronavirus 2 by RT PCR NEGATIVE NEGATIVE Final    Comment: (NOTE) SARS-CoV-2 target nucleic acids are NOT DETECTED. The SARS-CoV-2 RNA is generally detectable in upper respiratoy specimens during the acute phase of infection. The lowest concentration of  SARS-CoV-2 viral copies this assay can detect is 131 copies/mL. A negative result does not preclude SARS-Cov-2 infection and should not be used as the sole basis for treatment or other patient management decisions. A negative result may occur with  improper specimen collection/handling, submission of specimen other than nasopharyngeal swab, presence of viral mutation(s) within the areas targeted by this assay, and inadequate number of viral copies (<131 copies/mL). A negative result must be combined with clinical observations, patient history, and epidemiological information. The expected result is Negative. Fact Sheet for Patients:   PinkCheek.be Fact Sheet for Healthcare Providers:  GravelBags.it This test is not yet ap proved or cleared by the [place] FDA and  has been authorized for detection and/or diagnosis of SARS-CoV-2 by FDA under an Emergency Use Authorization (EUA). This EUA will remain  in effect (meaning this test can be used) for the duration of the COVID-19 declaration under Section 564(b)(1) of the Act, 21 U.S.C. section 360bbb-3(b)(1), unless the authorization is terminated or revoked sooner.    Influenza A by PCR NEGATIVE NEGATIVE Final   Influenza B by PCR NEGATIVE NEGATIVE Final    Comment: (NOTE) The Xpert Xpress SARS-CoV-2/FLU/RSV assay is intended as an aid in  the diagnosis of influenza from Nasopharyngeal swab specimens and  should not be used as a sole basis for treatment. Nasal washings and  aspirates are unacceptable for Xpert Xpress SARS-CoV-2/FLU/RSV  testing. Fact Sheet for Patients: PinkCheek.be Fact Sheet for Healthcare Providers: GravelBags.it This test is not yet approved or cleared by the [place] FDA and  has been authorized for detection and/or diagnosis of SARS-CoV-2 by  FDA under an Emergency Use Authorization (EUA). This EUA will remain  in effect (meaning this test can be used) for the duration of the  Covid-19 declaration under Section 564(b)(1) of the Act, 21  U.S.C. section 360bbb-3(b)(1), unless the authorization is  terminated or revoked. Performed at [facility], [address] [place], [place] [address]     Coagulation Studies: Recent Labs    09/12/19 1420  LABPROT 13.4  INR 1.1    Imaging: CT ANGIO HEAD W OR WO CONTRAST  Result Date: 09/12/2019 CLINICAL DATA:  69 year old male code stroke presentation this afternoon. History of chronic cerebral infarcts with age indeterminate right cerebellar infarct on plain CT.  EXAM: CT ANGIOGRAPHY HEAD AND NECK TECHNIQUE: Multidetector CT imaging of the head and neck was performed using the standard protocol during bolus administration of intravenous contrast. Multiplanar CT image reconstructions and MIPs were obtained to evaluate the vascular anatomy. Carotid stenosis measurements (when applicable) are obtained utilizing NASCET criteria, using the distal internal carotid diameter as the denominator. CONTRAST:  74mL OMNIPAQUE IOHEXOL 350 MG/ML SOLN COMPARISON:  Plain head CT 1409 hours today. Carotid Doppler ultrasound 10/03/2016. Head MRA 10/04/2016. FINDINGS: CTA NECK Skeleton: Diffuse idiopathic skeletal hyperostosis (DISH). Bulky and flowing osteophytes throughout the visible spine. Absent maxillary dentition. No acute osseous abnormality identified. Upper chest: Mild upper lung scarring. Upper limits of normal superior mediastinal lymph nodes. Other neck: 2 cm anterior right thyroid lobe hypodense nodule which meets size criteria for ultrasound follow-up (ref: J Am Coll Radiol. 2015 Feb;12(2): 143-50).The glottis is closed. Partial effaced pharynx also. Negative for neck mass or lymphadenopathy. Aortic arch: 3 vessel arch configuration with mild arch atherosclerosis. Right carotid system: Mild brachiocephalic atherosclerosis without stenosis. Negative right CCA origin. Mild calcified plaque proximal to the bifurcation. Moderate mixed soft and calcified plaque at the right ICA origin and bulb, but  less than 50 % stenosis with respect to the distal vessel. Tortuous right ICA just below the skull. Left carotid system: Soft and calcified plaque at the left CCA origin with less than 50 % stenosis with respect to the distal vessel. Occasional mild calcified plaque proximal to the bifurcation. Bulky calcified plaque at the left ICA origin and bulb with up to 50 % stenosis with respect to the distal vessel. Tortuous left ICA just below the skull. Vertebral arteries: Proximal right subclavian  artery plaque without stenosis. The right vertebral artery origin is difficult to identify with suspected at least moderate stenosis on series 7, image 179. The right vertebral is somewhat diminutive but otherwise patent to the skull base without stenosis. Mild proximal left subclavian artery plaque without stenosis. Dominant left vertebral artery. Similar to the right side evidence of moderate to severe left vertebral artery origin stenosis due to atherosclerosis on series 8, image 160. Some of the V1 segment is obscured by dense paravertebral venous contrast. But the left vertebral artery remains dominant and patent to the skull base without additional stenosis. CTA HEAD Posterior circulation: Mild left V4 calcified plaque without stenosis. Non dominant right vertebral artery functionally terminates in PICA. Both PICA vessels appear to be patent. Tortuous left vertebrobasilar junction and basilar artery. No basilar stenosis. Patent SCA and PCA origins. Posterior communicating arteries are diminutive or absent. Bilateral PCA branches are within normal limits, the left mildly asymmetrically smaller in the setting of chronic left distal PCA infarct. Anterior circulation: Both ICA siphons are patent and tortuous. Bilateral siphon calcified plaque does not result in significant stenosis. Patent carotid termini, MCA and ACA origins. Codominant A1 segments. Anterior communicating artery and bilateral ACA branches are within normal limits. Left MCA M1 segment and bifurcation are patent without stenosis. Left MCA branches are within normal limits. Right MCA M1 segment and bifurcation are patent without stenosis. Right MCA branches are within normal limits. Venous sinuses: Patent. Anatomic variants: Dominant left vertebral artery which primarily supplies the basilar. Review of the MIP images confirms the above findings IMPRESSION: 1. Negative for large vessel occlusion. 2. Positive for moderate to severe bilateral vertebral  artery origin stenosis. The left vertebral artery is dominant and primarily supplies the basilar without additional stenosis. Both PICA vessels appear to remain patent. 3. Bilateral cervical and intracranial carotid atherosclerosis. Up to 50% stenosis at the left ICA origin, no hemodynamically significant stenosis elsewhere. 4. A 2 cm right thyroid lobe nodule which meets size criteria for Thyroid Ultrasound follow-up. 5. Diffuse idiopathic skeletal hyperostosis (DISH). Electronically Signed   By: Genevie Ann M.D.   On: 09/12/2019 14:59   CT ANGIO NECK W OR WO CONTRAST  Result Date: 09/12/2019 CLINICAL DATA:  69 year old male code stroke presentation this afternoon. History of chronic cerebral infarcts with age indeterminate right cerebellar infarct on plain CT. EXAM: CT ANGIOGRAPHY HEAD AND NECK TECHNIQUE: Multidetector CT imaging of the head and neck was performed using the standard protocol during bolus administration of intravenous contrast. Multiplanar CT image reconstructions and MIPs were obtained to evaluate the vascular anatomy. Carotid stenosis measurements (when applicable) are obtained utilizing NASCET criteria, using the distal internal carotid diameter as the denominator. CONTRAST:  37mL OMNIPAQUE IOHEXOL 350 MG/ML SOLN COMPARISON:  Plain head CT 1409 hours today. Carotid Doppler ultrasound 10/03/2016. Head MRA 10/04/2016. FINDINGS: CTA NECK Skeleton: Diffuse idiopathic skeletal hyperostosis (DISH). Bulky and flowing osteophytes throughout the visible spine. Absent maxillary dentition. No acute osseous abnormality identified. Upper chest: Mild upper lung  scarring. Upper limits of normal superior mediastinal lymph nodes. Other neck: 2 cm anterior right thyroid lobe hypodense nodule which meets size criteria for ultrasound follow-up (ref: J Am Coll Radiol. 2015 Feb;12(2): 143-50).The glottis is closed. Partial effaced pharynx also. Negative for neck mass or lymphadenopathy. Aortic arch: 3 vessel arch  configuration with mild arch atherosclerosis. Right carotid system: Mild brachiocephalic atherosclerosis without stenosis. Negative right CCA origin. Mild calcified plaque proximal to the bifurcation. Moderate mixed soft and calcified plaque at the right ICA origin and bulb, but less than 50 % stenosis with respect to the distal vessel. Tortuous right ICA just below the skull. Left carotid system: Soft and calcified plaque at the left CCA origin with less than 50 % stenosis with respect to the distal vessel. Occasional mild calcified plaque proximal to the bifurcation. Bulky calcified plaque at the left ICA origin and bulb with up to 50 % stenosis with respect to the distal vessel. Tortuous left ICA just below the skull. Vertebral arteries: Proximal right subclavian artery plaque without stenosis. The right vertebral artery origin is difficult to identify with suspected at least moderate stenosis on series 7, image 179. The right vertebral is somewhat diminutive but otherwise patent to the skull base without stenosis. Mild proximal left subclavian artery plaque without stenosis. Dominant left vertebral artery. Similar to the right side evidence of moderate to severe left vertebral artery origin stenosis due to atherosclerosis on series 8, image 160. Some of the V1 segment is obscured by dense paravertebral venous contrast. But the left vertebral artery remains dominant and patent to the skull base without additional stenosis. CTA HEAD Posterior circulation: Mild left V4 calcified plaque without stenosis. Non dominant right vertebral artery functionally terminates in PICA. Both PICA vessels appear to be patent. Tortuous left vertebrobasilar junction and basilar artery. No basilar stenosis. Patent SCA and PCA origins. Posterior communicating arteries are diminutive or absent. Bilateral PCA branches are within normal limits, the left mildly asymmetrically smaller in the setting of chronic left distal PCA infarct.  Anterior circulation: Both ICA siphons are patent and tortuous. Bilateral siphon calcified plaque does not result in significant stenosis. Patent carotid termini, MCA and ACA origins. Codominant A1 segments. Anterior communicating artery and bilateral ACA branches are within normal limits. Left MCA M1 segment and bifurcation are patent without stenosis. Left MCA branches are within normal limits. Right MCA M1 segment and bifurcation are patent without stenosis. Right MCA branches are within normal limits. Venous sinuses: Patent. Anatomic variants: Dominant left vertebral artery which primarily supplies the basilar. Review of the MIP images confirms the above findings IMPRESSION: 1. Negative for large vessel occlusion. 2. 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 The left vertebral artery is dominant and primarily supplies the basilar without additional stenosis. Both PICA vessels appear to remain patent. 3. Bilateral cervical and intracranial carotid atherosclerosis. Up to 50% stenosis at the left ICA origin, no hemodynamically significant stenosis elsewhere. 4. A 2 cm right thyroid lobe nodule which meets size criteria for Thyroid Ultrasound follow-up. 5. Diffuse idiopathic skeletal hyperostosis (DISH). Electronically Signed   By: Genevie Ann M.D.   On: 09/12/2019 14:59   MR BRAIN WO CONTRAST  Result Date: 09/12/2019 CLINICAL DATA:  Ataxia, stroke suspected. EXAM: MRI HEAD WITHOUT CONTRAST TECHNIQUE: Multiplanar, multiecho pulse sequences of the brain and surrounding structures were obtained without intravenous contrast. COMPARISON:  Noncontrast CT head and CT angiogram head/neck performed earlier the same day 09/12/2019 FINDINGS: Brain: There is cortical/subcortical restricted diffusion  consistent with acute infarction within the right PCA vascular territory involving the posteromedial right temporal lobe and right occipital lobe. This encompasses a region of approximately 6 cm.  Patchy corresponding T2/FLAIR hyperintensity at this site. There is no significant mass effect at this time. Redemonstrated chronic cortically based infarct within the left MCA vascular territory within the left frontoparietal lobes. Mild chronic hemosiderin deposition at this site. Redemonstrated chronic left PCA territory cortically based occipital lobe infarct. Chronic lacunar infarct within the superior right cerebellar hemisphere. Mild ill-defined T2/FLAIR hyperintensity within the pons consistent with chronic small ischemic changes. Mild generalized parenchymal atrophy. Incidentally noted cavum septum pellucidum and cavum vergae. No evidence of intracranial mass. No chronic intracranial blood products. No extra-axial fluid collection. No midline shift. Vascular: Expected proximal arterial flow voids. Skull and upper cervical spine: No focal marrow lesion. Sinuses/Orbits: Visualized orbits show no acute finding. Mild ethmoid and maxillary sinus mucosal thickening. No significant mastoid effusion IMPRESSION: 1. Acute cortical/subcortical infarct within the posteromedial right temporal lobe and right occipital lobe within the right PCA vascular territory. 2. Redemonstrated chronic infarcts within the left frontoparietal lobes, left occipital lobe and superior right cerebellum. 3. Mild chronic small vessel ischemic changes within the pons. 4. Mild generalized parenchymal atrophy. 5. Mild paranasal sinus mucosal thickening. Electronically Signed   By: Kellie Simmering DO   On: 09/12/2019 18:19   ECHOCARDIOGRAM COMPLETE  Result Date: 09/13/2019    ECHOCARDIOGRAM REPORT   Patient Name:   [name] Date of Exam: 09/13/2019 Medical Rec #:  [id number]          Height:       74.0 in Accession #:    [id number]         Weight:       214.0 lb Date of Birth:  11-03-1950           BSA:          2.237 m Patient Age:    69 years           BP:           168/92 mmHg Patient Gender: M                  HR:           66 bpm.  Exam Location:  [facility] Procedure: 2D Echo, Cardiac Doppler and Color Doppler Indications:     Stroke 434.91  History:         Patient has prior history of Echocardiogram examinations, most                  recent 04/08/2019. Stroke, Arrythmias:Atrial Fibrillation; Risk                  Factors:Hypertension. Pulmonary embolism.  Sonographer:     Sherrie Sport RDCS (AE) Referring Phys:  [id number] Loletha [name] Diagnosing Phys: Bartholome Bill MD IMPRESSIONS  1. Left ventricular ejection fraction, by estimation, is 40 to 45%. The left ventricle has mildly decreased function. The left ventricle has no regional wall motion abnormalities. The left ventricular internal cavity size was mildly dilated. There is mild left ventricular hypertrophy. Left ventricular diastolic parameters are consistent with Grade I diastolic dysfunction (impaired relaxation).  2. Right ventricular systolic function is normal. The right ventricular size is mildly enlarged. There is normal pulmonary artery systolic pressure.  3. Left atrial size was mildly dilated.  4. Right atrial size was mildly dilated.  5. The mitral valve is grossly normal.  Mild mitral valve regurgitation.  6. The aortic valve is grossly normal. Aortic valve regurgitation is mild. FINDINGS  Left Ventricle: Left ventricular ejection fraction, by estimation, is 40 to 45%. The left ventricle has mildly decreased function. The left ventricle has no regional wall motion abnormalities. The left ventricular internal cavity size was mildly dilated. There is mild left ventricular hypertrophy. Left ventricular diastolic parameters are consistent with Grade I diastolic dysfunction (impaired relaxation). Right Ventricle: The right ventricular size is mildly enlarged. No increase in right ventricular wall thickness. Right ventricular systolic function is normal. There is normal pulmonary artery systolic pressure. The tricuspid regurgitant velocity is 2.16  m/s, and with an assumed right atrial  pressure of 10 mmHg, the estimated right ventricular systolic pressure is 54.0 mmHg. Left Atrium: Left atrial size was mildly dilated. Right Atrium: Right atrial size was mildly dilated. Pericardium: There is no evidence of pericardial effusion. Mitral Valve: The mitral valve is grossly normal. Mild mitral valve regurgitation. Tricuspid Valve: The tricuspid valve is grossly normal. Tricuspid valve regurgitation is mild. Aortic Valve: The aortic valve is grossly normal. Aortic valve regurgitation is mild. Aortic valve mean gradient measures 2.5 mmHg. Aortic valve peak gradient measures 4.2 mmHg. Aortic valve area, by VTI measures 2.79 cm. Pulmonic Valve: The pulmonic valve was not well visualized. Pulmonic valve regurgitation is trivial. Aorta: The aortic root is normal in size and structure. IAS/Shunts: The interatrial septum was not assessed.  LEFT VENTRICLE PLAX 2D LVIDd:         5.01 cm LVIDs:         4.11 cm LV PW:         1.38 cm LV IVS:        1.15 cm LVOT diam:     2.20 cm LV SV:         51 LV SV Index:   23 LVOT Area:     3.80 cm  RIGHT VENTRICLE RV Basal diam:  3.53 cm RV S prime:     9.57 cm/s TAPSE (M-mode): 4.3 cm LEFT ATRIUM              Index       RIGHT ATRIUM           Index LA diam:        4.70 cm  2.10 cm/m  RA Area:     39.70 cm LA Vol (A2C):   100.0 ml 44.71 ml/m RA Volume:   157.00 ml 70.19 ml/m LA Vol (A4C):   163.0 ml 72.87 ml/m LA Biplane Vol: 137.0 ml 61.25 ml/m  AORTIC VALVE                   PULMONIC VALVE AV Area (Vmax):    2.07 cm    RVOT Peak grad: 1 mmHg AV Area (Vmean):   2.01 cm AV Area (VTI):     2.79 cm AV Vmax:           103.00 cm/s AV Vmean:          69.550 cm/s AV VTI:            0.181 m AV Peak Grad:      4.2 mmHg AV Mean Grad:      2.5 mmHg LVOT Vmax:         56.10 cm/s LVOT Vmean:        36.800 cm/s LVOT VTI:          0.133 m LVOT/AV VTI ratio: 0.73  AORTA Ao Root diam: 3.10 cm MITRAL VALVE                TRICUSPID VALVE MV Area (PHT): 4.74 cm     TR Peak grad:    18.7 mmHg MV Decel Time: 160 msec     TR Vmax:        216.00 cm/s MV E velocity: 123.00 cm/s                             SHUNTS                             Systemic VTI:  0.13 m                             Systemic Diam: 2.20 cm Bartholome Bill MD Electronically signed by Bartholome Bill MD Signature Date/Time: 09/13/2019/11:29:55 AM    Final    CT HEAD CODE STROKE WO CONTRAST  Result Date: 09/12/2019 CLINICAL DATA:  Code stroke. EXAM: CT HEAD WITHOUT CONTRAST TECHNIQUE: Contiguous axial images were obtained from the base of the skull through the vertex without intravenous contrast. COMPARISON:  2019 FINDINGS: Brain: There is no acute intracranial hemorrhage, mass effect, or edema. There is no new loss of gray-white differentiation. Chronic left MCA territory infarction involving the frontoparietal lobes and insula again identified. Additional chronic left occipital infarct is again seen. There is a new small infarct of the superior right cerebellum. Otherwise, patchy hypoattenuation in the supratentorial white matter is nonspecific but may reflect stable chronic microvascular ischemic changes. The ventricles and sulci are within normal limits in size and configuration apart from volume loss associated with infarcts. Vascular: There is no hyperdense vessel. Mild intracranial atherosclerotic calcification is present at the skull base. Skull: Unremarkable. Sinuses/Orbits: No acute abnormality. Relative prominence of the left superior ophthalmic vein may reflect a varix. Other: Mastoid air cells are clear. ASPECTS ([place] Stroke Program Early CT Score) - Ganglionic level infarction (caudate, lentiform nuclei, internal capsule, insula, M1-M3 cortex): 7 - Supraganglionic infarction (M4-M6 cortex): 3 Total score (0-10 with 10 being normal): 10, accounting for chronic infarct IMPRESSION: No acute intracranial hemorrhage or evidence of acute infarction. ASPECT score is 10 accounting for chronic infarct. New small infarct of  the right cerebellum is favored to be chronic but age indeterminate. These results were called by telephone at the time of interpretation on 09/12/2019 at 2:17 pm to provider Lavonia Drafts , who verbally acknowledged these results. Electronically Signed   By: Macy Mis M.D.   On: 09/12/2019 14:25    Medications:  I have reviewed the patient's current medications. Scheduled: . allopurinol  300 mg Oral Daily  . apixaban  5 mg Oral BID  . atorvastatin  80 mg Oral QPM  . colchicine  0.6 mg Oral Daily  . furosemide  40 mg Oral BID  . gabapentin  400 mg Oral TID  . insulin aspart  0-5 Units Subcutaneous QHS  . insulin aspart  0-9 Units Subcutaneous TID WC  . lidocaine  1 patch Transdermal Q12H  . losartan  100 mg Oral Daily  . metFORMIN  500 mg Oral BID WC  . nicotine  7 mg Transdermal Daily  . potassium chloride  40 mEq Oral BID    Assessment/Plan: 69 y.o. male with medical history significant ofatrial fibrillation on Eliquis, diabetes  mellitus, hypertension who presents with acute onset of dizziness, headache and blurred vision.  Head CT personally reviewed and shows no acute changes.  CTA of the head and neck personally reviewed and shows no evidence of LVO.  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  PICAs appear patent.  MRI of the brain now complete and personally reviewed.  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  Patient hypertensive.  Has cardiac issues overnight.  Cardiology is following patient.  Echocardiogram is pending.  LDL 72, A1c 6.2.  Stroke Risk Factors - atrial fibrillation, diabetes mellitus and hypertension  Plan: 1. PT consult, OT consult, Speech consult 2. High potency statin initiation with target LDL<70. 3. Telemetry monitoring 4. Frequent neuro checks 5. Would follow cardiology recommendations for BP control in light of recent cardiac issues.      LOS: 0 days   Alexis Goodell,  MD Neurology [phone] 09/13/2019  12:10 PM

## 2019-09-13 NOTE — Consult Note (Signed)
  HPI  Per the ED, "[name] is a 69 y.o. male with a history of atrial fibrillation, diabetes, hypertension, CVA presents with complaints of dizziness and headache which started relatively abruptly approximately 1 hour prior to arrival in the ED.  He does have prior deficits from prior CVA including affected speech and weakness on the right side.  Is also on blood thinners.  No fevers chills or neck pain.  No trauma or falls.  Denies drug use.  Has not taken anything for this."  MRI brain showed an ACUTE infarct of the right occipital lobe, along with CHRONIC infarcts of the left occipital lobe.   Patient recently observed that his vision is worse than it was before his arrival at the hospital.  Objective  Vision;  Count Fingers OD, unreliable responses OS Pressure: 21 OD / 22 OS Pupils: Miotic, minimally reactive Visual fields: Unreliable responses Extraocular movements: Full  Lids: Normal OU Lashes: Normal OU Conjunctiva: Racial melanosis OU Cornea: Arcus senilis OU Anterior Chamber: Clear, no inflammation Pupils: Miotic OU Lens: 2+ nuclear sclerosis OU  Optic nerve: Sharp. C:D 0.6 OU Vitreous: PVD OU Macula: Clear Peripheral Retina: Clear, flat, within normal limits.  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  The acute right occipital infarct is likely causing a left homonymous hemianopia.  The combined effect of the two means the patient will have limited peripheral vision, and potentially impaired central vision. - The patient's ocular structures appear normal, heightening suspicion that this is a cerebral issue - Recommend treatment per neurology and/or cardiology. - At discharge, please arrange outpatient follow-up at an eye clinic (to correct any refractive error).  [name] MD Ophthalmology - [facility]

## 2019-09-13 NOTE — Progress Notes (Signed)
Rehab Admissions Coordinator Note:  Patient was screened by Cleatrice Burke for appropriateness for an Inpatient Acute Rehab admit at [facility] per therapy recs.   At this time, we are recommending Inpatient Rehab consult. I will place order per protocol and one of our Admission Coordinators with follow up for full assessment. Thanks for the referral.  Cleatrice Burke RN MSN 09/13/2019, 1:17 PM  I can be reached at [phone].

## 2019-09-13 NOTE — Evaluation (Signed)
Physical Therapy Evaluation Patient Details Name: [name] MRN: [id number] DOB: 1950-08-11 Today's Date: 09/13/2019   History of Present Illness  Pt admitted for HTN with positive R temporal/occipital lobe CVA. PMH includes prior CVA, DVT, Afib, DM, and HTN. Pt with complaints of dizziness, HA, and speech difficulties.   Clinical Impression  Pt is a pleasant 69 year old male who was admitted for HTN and now with + CVA. Pt performs bed mobility with supervision and transfers/ambulation with min assist with HHA. Uses SPC at baseline. Pt demonstrates deficits with balance/endurance. Pt with elevated vitals including resting BP at 175/108. Pt complains of vision deficits, inconsistent with testing, however both eyes appear to be affected. Has difficulty with recognizing pictures of objects and reading sentences. Coordination slow, but grossly intact. Would benefit from skilled PT to address above deficits and promote optimal return to PLOF. Currently recommending CIR at this time as he is off his baseline and would benefit from intense therapy from multiple disciplines.    Follow Up Recommendations CIR    Equipment Recommendations  None recommended by PT    Recommendations for Other Services Rehab consult     Precautions / Restrictions Precautions Precautions: Fall Restrictions Weight Bearing Restrictions: No      Mobility  Bed Mobility Overal bed mobility: Needs Assistance Bed Mobility: Supine to Sit     Supine to sit: Supervision     General bed mobility comments: needs cues for task initiation. ONce seated at EOB, able to sit with upright posture. Uses bed rail for assistance  Transfers Overall transfer level: Needs assistance Equipment used: 1 person hand held assist Transfers: Sit to/from Stand Sit to Stand: Min assist         General transfer comment: needs assist for standing. Once standing, upright posture noted  Ambulation/Gait Ambulation/Gait  assistance: Min assist Gait Distance (Feet): 5 Feet Assistive device: 1 person hand held assist Gait Pattern/deviations: Step-to pattern     General Gait Details: ambulated to recliner with 1 assist, however unsteady and has difficulty visualizing furniture in room without direct head turns and attention to task  Stairs            Wheelchair Mobility    Modified Rankin (Stroke Patients Only)       Balance Overall balance assessment: Needs assistance Sitting-balance support: Feet supported Sitting balance-Leahy Scale: Good     Standing balance support: Single extremity supported Standing balance-Leahy Scale: Good                               Pertinent Vitals/Pain Pain Assessment: No/denies pain    Home Living Family/patient expects to be discharged to:: Private residence Living Arrangements: Alone Available Help at Discharge: Friend(s);Available PRN/intermittently Type of Home: Apartment Home Access: Level entry     Home Layout: One level Home Equipment: Cane - single point      Prior Function Level of Independence: Independent with assistive device(s)         Comments: From previous chart review, Pt reports he is active and independent at baseline. Manages all BADL tasks independenty, drives, and shops. He states his girlfriend assists with cooking and cleaning. Endorses a few near miss falls onto his bed, but no significant falls in the past year.     Hand Dominance        Extremity/Trunk Assessment   Upper Extremity Assessment Upper Extremity Assessment: Generalized weakness(R UE grossly 4/5; L UE grossly  5/5)    Lower Extremity Assessment Lower Extremity Assessment: Overall WFL for tasks assessed       Communication   Communication: Expressive difficulties;HOH  Cognition Arousal/Alertness: Awake/alert Behavior During Therapy: WFL for tasks assessed/performed Overall Cognitive Status: Within Functional Limits for tasks  assessed                                        General Comments      Exercises Other Exercises Other Exercises: Seated ther-ex performed on B LE including LAQ, alt marching and SLRs. All ther-ex performed on B LE with supervision. Flat affect throughout session.   Assessment/Plan    PT Assessment Patient needs continued PT services  PT Problem List Decreased strength;Decreased balance;Decreased activity tolerance;Decreased mobility       PT Treatment Interventions DME instruction;Gait training;Stair training;Therapeutic activities;Therapeutic exercise;Balance training    PT Goals (Current goals can be found in the Care Plan section)  Acute Rehab PT Goals Patient Stated Goal: to go home PT Goal Formulation: With patient Time For Goal Achievement: 09/27/19 Potential to Achieve Goals: Good    Frequency 7X/week   Barriers to discharge        Co-evaluation               AM-PAC PT "6 Clicks" Mobility  Outcome Measure Help needed turning from your back to your side while in a flat bed without using bedrails?: None Help needed moving from lying on your back to sitting on the side of a flat bed without using bedrails?: A Little Help needed moving to and from a bed to a chair (including a wheelchair)?: A Little Help needed standing up from a chair using your arms (e.g., wheelchair or bedside chair)?: A Little Help needed to walk in hospital room?: A Little Help needed climbing 3-5 steps with a railing? : A Lot 6 Click Score: 18    End of Session Equipment Utilized During Treatment: Gait belt Activity Tolerance: Patient tolerated treatment well Patient left: in chair;with chair alarm set Nurse Communication: Mobility status PT Visit Diagnosis: Muscle weakness (generalized) (M62.81);Difficulty in walking, not elsewhere classified (R26.2);Unsteadiness on feet (R26.81)    Time: 0814-4818 PT Time Calculation (min) (ACUTE ONLY): 28 min   Charges:   PT  Evaluation $PT Eval Moderate Complexity: 1 Mod PT Treatments $Therapeutic Exercise: 8-22 mins        Greggory Stallion, PT, DPT [phone]   Greggory Safranek 09/13/2019, 12:58 PM

## 2019-09-13 NOTE — Evaluation (Signed)
Occupational Therapy Evaluation Patient Details Name: [name] MRN: [id number] DOB: 02-03-1951 Today's Date: 09/13/2019    History of Present Illness Pt admitted for HTN with positive R temporal/occipital lobe CVA. PMH includes prior CVA, DVT, Afib, DM, and HTN. Pt with complaints of dizziness, HA, and speech difficulties.    Clinical Impression   Pt seen for OT evaluation this date. Pt lives in a 1st fl apartment by himself and independent before this admission. Pt reports his girlfriend helps out doing cooking and cleaning (although pt is capable). Pt indep with med mgt and driving. Pt reports mild headache and blurry vision. Increased processing and problem solving time required for tasks and verbal responses to questioning. Inconsistent visual testing, but appears pt may have visual field deficits or inattention deficit. Difficulty with quadrant testing (inconsistent) and seeing L side of objects such as the TV and clock on the wall. Able to correctly identify colors, objects, and body parts, although some slurring/expressive language deficits noted. Pt unable to reach text either in up close or far away 2/2 blurriness per pt but reports being able to read at baseline. Able to correctly identify numbers on L side. Pt currently presents with significant visual deficits which can impact his functional mobility, safety, and ability to perform ADL and IADL tasks at [facility], including driving. Pt/family educated in compensatory strategies safety awareness, falls prevention, navigating the environment. Pt endorses he would be unable to drive at this point.  Pt at supervision to [place] level for ADL mobility, intact sensation and coordination and no additional cognitive deficits noted upon assessment. Recommend high intensity skilled OT services at this time to support recovery and return to PLOF.    Follow Up Recommendations  CIR    Equipment Recommendations  None recommended by OT     Recommendations for Other Services       Precautions / Restrictions Precautions Precautions: Fall Restrictions Weight Bearing Restrictions: No      Mobility Bed Mobility Overal bed mobility: Needs Assistance Bed Mobility: Supine to Sit;Sit to Supine     Supine to sit: Supervision Sit to supine: Supervision   General bed mobility comments: cues for task initiation  Transfers Overall transfer level: Needs assistance Equipment used: 1 person hand held assist Transfers: Sit to/from Stand Sit to Stand: Min assist         General transfer comment: needs assist for standing. Once standing, upright posture noted    Balance Overall balance assessment: Needs assistance Sitting-balance support: Feet supported Sitting balance-Leahy Scale: Good     Standing balance support: Single extremity supported Standing balance-Leahy Scale: Good                             ADL either performed or assessed with clinical judgement   ADL Overall ADL's : Needs assistance/impaired Eating/Feeding: Sitting;Modified independent Eating/Feeding Details (indicate cue type and reason): increased time to find items located on L side     Upper Body Bathing: Sitting;Set up;Supervision/ safety   Lower Body Bathing: Min guard;Set up;Sit to/from stand   Upper Body Dressing : Sitting;Set up;Supervision/safety   Lower Body Dressing: Sit to/from stand;Min guard;Set up   Toilet Transfer: Ambulation;Min guard                   Vision Baseline Vision/History: No visual deficits Patient Visual Report: Blurring of vision Vision Assessment?: Yes Eye Alignment: Within Functional Limits Ocular Range of Motion: Within Functional Limits Alignment/Gaze  Preference: Within Defined Limits Tracking/Visual Pursuits: Able to track stimulus in all quads without difficulty Visual Fields: Left visual field deficit(inconsistent, reports only seeing R half of tv and clock but initially states  he cannot see the clock at all (RUQ of vision)) Additional Comments: inconsistent visual testing but does appear to have most trouble with L side of vision but did not appear to have hemianopsia with testing, (denied not seeing finger move around side of vision), difficulty with quadrant testing but inconsistent     Perception     Praxis      Pertinent Vitals/Pain Pain Assessment: 0-10 Pain Score: 3  Pain Location: headache Pain Descriptors / Indicators: Headache Pain Intervention(s): Limited activity within patient's tolerance;Monitored during session     Hand Dominance Right   Extremity/Trunk Assessment Upper Extremity Assessment Upper Extremity Assessment: Generalized weakness(R UE grossly 4/5; L UE grossly 5/5, denies sensory deficits, FMC WNL)   Lower Extremity Assessment Lower Extremity Assessment: Overall WFL for tasks assessed   Cervical / Trunk Assessment Cervical / Trunk Assessment: Normal   Communication Communication Communication: Expressive difficulties;HOH   Cognition Arousal/Alertness: Awake/alert Behavior During Therapy: WFL for tasks assessed/performed Overall Cognitive Status: Within Functional Limits for tasks assessed                                 General Comments: slightly increased processing time and cues to initiate tasks   General Comments       Exercises Exercises: Other exercises Other Exercises Other Exercises: Seated ther-ex performed on B LE including LAQ, alt marching and SLRs. All ther-ex performed on B LE with supervision. Flat affect throughout session. Other Exercises: Pt able to correctly identify objects and pictures of objects 100% of the time, able to visually locate and reach for items R>L side of tray table, encouraged girlfriend to continue to sit on L side of room   Shoulder Instructions      Home Living Family/patient expects to be discharged to:: Private residence Living Arrangements: Alone Available Help  at Discharge: Friend(s);Available PRN/intermittently Type of Home: Apartment Home Access: Level entry     Home Layout: One level     Bathroom Shower/Tub: Teacher, early years/pre: Standard     Home Equipment: Cane - single point;Grab bars - tub/shower          Prior Functioning/Environment Level of Independence: Independent with assistive device(s)        Comments: From previous chart review, Pt reports he is active and independent at baseline. Manages all BADL tasks independenty, drives, and shops. He states his girlfriend assists with cooking and cleaning. Endorses a few near miss falls onto his bed, but no significant falls in the past year.        OT Problem List: Impaired vision/perception;Impaired balance (sitting and/or standing);Decreased cognition      OT Treatment/Interventions: Self-care/ADL training;Therapeutic exercise;Therapeutic activities;Neuromuscular education;DME and/or AE instruction;Visual/perceptual remediation/compensation;Patient/family education;Balance training    OT Goals(Current goals can be found in the care plan section) Acute Rehab OT Goals Patient Stated Goal: to see better and get back to driving OT Goal Formulation: With patient/family Time For Goal Achievement: 09/27/19 Potential to Achieve Goals: Good  OT Frequency: Min 3X/week   Barriers to D/C:            Co-evaluation              AM-PAC OT "6 Clicks" Daily Activity  Outcome Measure Help from another person eating meals?: None Help from another person taking care of personal grooming?: None Help from another person toileting, which includes using toliet, bedpan, or urinal?: A Little Help from another person bathing (including washing, rinsing, drying)?: A Little Help from another person to put on and taking off regular upper body clothing?: None Help from another person to put on and taking off regular lower body clothing?: A Little 6 Click Score: 21    End of Session    Activity Tolerance: Patient tolerated treatment well Patient left: in bed;with call bell/phone within reach;with bed alarm set;with family/visitor present(MD in room)  OT Visit Diagnosis: Other abnormalities of gait and mobility (R26.89);Low vision, both eyes (H54.2)                Time: 9147-8295 OT Time Calculation (min): 23 min Charges:  OT General Charges $OT Visit: 1 Visit OT Evaluation $OT Eval Moderate Complexity: 1 Mod OT Treatments $Neuromuscular Re-education: 8-22 mins  Jeni Salles, MPH, MS, OTR/L ascom [phone] 09/13/19, 2:10 PM

## 2019-09-13 NOTE — Progress Notes (Signed)
*  PRELIMINARY RESULTS* Echocardiogram 2D Echocardiogram has been performed.  [name] 09/13/2019, 10:07 AM

## 2019-09-13 NOTE — Progress Notes (Signed)
Received call from telemetry that patient heart rate 33, with multiple pauses, longest pause 2.47. At 412am Secure chat was sent to NP Rufina Falco. At 0413 NP Ouma replied, orders were placed for EKG. At 0431 secure chat was sent to NP Ouma to notified the patient's MEWS score is now yellow due to the low heart rate. Notified NP Ouma via secure chat the EKG should be in the computer. At 0446 NP Ouma replied "I can see the EKG thanks". At [place] NP Ouma replied "Placing cariology consult, holding next metoprolol". At [place] chat NP Ouma patient had 7 beats of VTach. At [place] NP Ouma replied "noted thank you".

## 2019-09-13 NOTE — Progress Notes (Signed)
Patient ID: [name], male   DOB: 1951/05/04, 69 y.o.   MRN: [id number] [facility] PROGRESS NOTE  [name] NWG:[id number] DOB: 12-09-1950 DOA: 09/12/2019 PCP: Theotis Burrow, MD  HPI/Subjective: Patient stated yesterday he had some blurred vision.  Overnight he stated he had worsening vision.  No eye pain.  Blood pressure up today.  Overnight had bradycardia and a pause.  Objective: Vitals:   09/13/19 1204 09/13/19 1303  BP: (!) 188/129 (!) 177/99  Pulse: (!) 58 (!) 55  Resp:    Temp:    SpO2:      Intake/Output Summary (Last 24 hours) at 09/13/2019 1310 Last data filed at 09/13/2019 1229 Gross per 24 hour  Intake --  Output 975 ml  Net -975 ml   Filed Weights   09/12/19 1359  Weight: 97.1 kg    ROS: Review of Systems  Constitutional: Negative for chills and fever.  Eyes: Positive for blurred vision. Negative for pain.  Respiratory: Negative for cough and shortness of breath.   Cardiovascular: Negative for chest pain.  Gastrointestinal: Negative for constipation, diarrhea, nausea and vomiting.  Genitourinary: Negative for dysuria.  Musculoskeletal: Negative for joint pain.  Neurological: Negative for dizziness and headaches.   Exam: Physical Exam  HENT:  Nose: No mucosal edema.  Mouth/Throat: No oropharyngeal exudate or posterior oropharyngeal edema.  Eyes: Pupils are equal, round, and reactive to light. Conjunctivae and lids are normal.  Right eye slightly deviated out  Neck: Carotid bruit is not present.  Cardiovascular: S1 normal and S2 normal. An irregularly irregular rhythm present. Exam reveals no gallop.  No murmur heard. Respiratory: No respiratory distress. He has no wheezes. He has no rhonchi. He has no rales.  GI: Soft. Bowel sounds are normal. There is no abdominal tenderness.  Musculoskeletal:     Right ankle: Swelling present.     Left ankle: Swelling present.  Lymphadenopathy:    He has no cervical adenopathy.   Neurological: He is alert.  Poor vision, each eye tested separately and both eyes together..  Can hardly see my fingers in front of his face.  Could not read the white board in the room and could not read close vision chart. Power 5 out of 5 bilateral upper and lower extremity  Skin: Skin is warm. No rash noted. Nails show no clubbing.  Psychiatric: He has a normal mood and affect.      Data Reviewed: Basic Metabolic Panel: Recent Labs  Lab 09/12/19 1420 09/13/19 0524  NA 142 139  K 3.2* 3.2*  CL 106 107  CO2 28 27  GLUCOSE 155* 95  BUN 16 15  CREATININE 0.93 0.95  CALCIUM 8.8* 8.6*   Liver Function Tests: Recent Labs  Lab 09/12/19 1420  AST 24  ALT 17  ALKPHOS 72  BILITOT 0.7  PROT 6.6  ALBUMIN 3.6   CBC: Recent Labs  Lab 09/12/19 1420  WBC 3.0*  NEUTROABS 1.6*  HGB 11.7*  HCT 37.9*  MCV 81.5  PLT 152   BNP (last 3 results) Recent Labs    04/08/19 0848  BNP 298.0*     CBG: Recent Labs  Lab 09/12/19 1402 09/12/19 1617 09/12/19 2209 09/13/19 0759 09/13/19 1132  GLUCAP 162* 82 100* 101* 85    Recent Results (from the past 240 hour(s))  Respiratory Panel by RT PCR (Flu A&B, Covid) - Nasopharyngeal Swab     Status: None   Collection Time: 09/12/19  3:20 PM   Specimen: Nasopharyngeal Swab  Result Value Ref Range Status   SARS Coronavirus 2 by RT PCR NEGATIVE NEGATIVE Final    Comment: (NOTE) SARS-CoV-2 target nucleic acids are NOT DETECTED. The SARS-CoV-2 RNA is generally detectable in upper respiratoy specimens during the acute phase of infection. The lowest concentration of SARS-CoV-2 viral copies this assay can detect is 131 copies/mL. A negative result does not preclude SARS-Cov-2 infection and should not be used as the sole basis for treatment or other patient management decisions. A negative result may occur with  improper specimen collection/handling, submission of specimen other than nasopharyngeal swab, presence of viral  mutation(s) within the areas targeted by this assay, and inadequate number of viral copies (<131 copies/mL). A negative result must be combined with clinical observations, patient history, and epidemiological information. The expected result is Negative. Fact Sheet for Patients:  PinkCheek.be Fact Sheet for Healthcare Providers:  GravelBags.it This test is not yet ap proved or cleared by the [place] FDA and  has been authorized for detection and/or diagnosis of SARS-CoV-2 by FDA under an Emergency Use Authorization (EUA). This EUA will remain  in effect (meaning this test can be used) for the duration of the COVID-19 declaration under Section 564(b)(1) of the Act, 21 U.S.C. section 360bbb-3(b)(1), unless the authorization is terminated or revoked sooner.    Influenza A by PCR NEGATIVE NEGATIVE Final   Influenza B by PCR NEGATIVE NEGATIVE Final    Comment: (NOTE) The Xpert Xpress SARS-CoV-2/FLU/RSV assay is intended as an aid in  the diagnosis of influenza from Nasopharyngeal swab specimens and  should not be used as a sole basis for treatment. Nasal washings and  aspirates are unacceptable for Xpert Xpress SARS-CoV-2/FLU/RSV  testing. Fact Sheet for Patients: PinkCheek.be Fact Sheet for Healthcare Providers: GravelBags.it This test is not yet approved or cleared by the [place] FDA and  has been authorized for detection and/or diagnosis of SARS-CoV-2 by  FDA under an Emergency Use Authorization (EUA). This EUA will remain  in effect (meaning this test can be used) for the duration of the  Covid-19 declaration under Section 564(b)(1) of the Act, 21  U.S.C. section 360bbb-3(b)(1), unless the authorization is  terminated or revoked. Performed at [facility], [address], [place], [place] [address]      Studies: CT ANGIO HEAD W OR WO  CONTRAST  Result Date: 09/12/2019 CLINICAL DATA:  69 year old male code stroke presentation this afternoon. History of chronic cerebral infarcts with age indeterminate right cerebellar infarct on plain CT. EXAM: CT ANGIOGRAPHY HEAD AND NECK TECHNIQUE: Multidetector CT imaging of the head and neck was performed using the standard protocol during bolus administration of intravenous contrast. Multiplanar CT image reconstructions and MIPs were obtained to evaluate the vascular anatomy. Carotid stenosis measurements (when applicable) are obtained utilizing NASCET criteria, using the distal internal carotid diameter as the denominator. CONTRAST:  41mL OMNIPAQUE IOHEXOL 350 MG/ML SOLN COMPARISON:  Plain head CT 1409 hours today. Carotid Doppler ultrasound 10/03/2016. Head MRA 10/04/2016. FINDINGS: CTA NECK Skeleton: Diffuse idiopathic skeletal hyperostosis (DISH). Bulky and flowing osteophytes throughout the visible spine. Absent maxillary dentition. No acute osseous abnormality identified. Upper chest: Mild upper lung scarring. Upper limits of normal superior mediastinal lymph nodes. Other neck: 2 cm anterior right thyroid lobe hypodense nodule which meets size criteria for ultrasound follow-up (ref: J Am Coll Radiol. 2015 Feb;12(2): 143-50).The glottis is closed. Partial effaced pharynx also. Negative for neck mass or lymphadenopathy. Aortic arch: 3 vessel arch configuration with mild arch atherosclerosis. Right carotid  system: Mild brachiocephalic atherosclerosis without stenosis. Negative right CCA origin. Mild calcified plaque proximal to the bifurcation. Moderate mixed soft and calcified plaque at the right ICA origin and bulb, but less than 50 % stenosis with respect to the distal vessel. Tortuous right ICA just below the skull. Left carotid system: Soft and calcified plaque at the left CCA origin with less than 50 % stenosis with respect to the distal vessel. Occasional mild calcified plaque proximal to the  bifurcation. Bulky calcified plaque at the left ICA origin and bulb with up to 50 % stenosis with respect to the distal vessel. Tortuous left ICA just below the skull. Vertebral arteries: Proximal right subclavian artery plaque without stenosis. The right vertebral artery origin is difficult to identify with suspected at least moderate stenosis on series 7, image 179. The right vertebral is somewhat diminutive but otherwise patent to the skull base without stenosis. Mild proximal left subclavian artery plaque without stenosis. Dominant left vertebral artery. Similar to the right side evidence of moderate to severe left vertebral artery origin stenosis due to atherosclerosis on series 8, image 160. Some of the V1 segment is obscured by dense paravertebral venous contrast. But the left vertebral artery remains dominant and patent to the skull base without additional stenosis. CTA HEAD Posterior circulation: Mild left V4 calcified plaque without stenosis. Non dominant right vertebral artery functionally terminates in PICA. Both PICA vessels appear to be patent. Tortuous left vertebrobasilar junction and basilar artery. No basilar stenosis. Patent SCA and PCA origins. Posterior communicating arteries are diminutive or absent. Bilateral PCA branches are within normal limits, the left mildly asymmetrically smaller in the setting of chronic left distal PCA infarct. Anterior circulation: Both ICA siphons are patent and tortuous. Bilateral siphon calcified plaque does not result in significant stenosis. Patent carotid termini, MCA and ACA origins. Codominant A1 segments. Anterior communicating artery and bilateral ACA branches are within normal limits. Left MCA M1 segment and bifurcation are patent without stenosis. Left MCA branches are within normal limits. Right MCA M1 segment and bifurcation are patent without stenosis. Right MCA branches are within normal limits. Venous sinuses: Patent. Anatomic variants: Dominant left  vertebral artery which primarily supplies the basilar. Review of the MIP images confirms the above findings IMPRESSION: 1. Negative for large vessel occlusion. 2. 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 The left vertebral artery is dominant and primarily supplies the basilar without additional stenosis. Both PICA vessels appear to remain patent. 3. Bilateral cervical and intracranial carotid atherosclerosis. Up to 50% stenosis at the left ICA origin, no hemodynamically significant stenosis elsewhere. 4. A 2 cm right thyroid lobe nodule which meets size criteria for Thyroid Ultrasound follow-up. 5. Diffuse idiopathic skeletal hyperostosis (DISH). Electronically Signed   By: Genevie Ann M.D.   On: 09/12/2019 14:59   CT ANGIO NECK W OR WO CONTRAST  Result Date: 09/12/2019 CLINICAL DATA:  69 year old male code stroke presentation this afternoon. History of chronic cerebral infarcts with age indeterminate right cerebellar infarct on plain CT. EXAM: CT ANGIOGRAPHY HEAD AND NECK TECHNIQUE: Multidetector CT imaging of the head and neck was performed using the standard protocol during bolus administration of intravenous contrast. Multiplanar CT image reconstructions and MIPs were obtained to evaluate the vascular anatomy. Carotid stenosis measurements (when applicable) are obtained utilizing NASCET criteria, using the distal internal carotid diameter as the denominator. CONTRAST:  63mL OMNIPAQUE IOHEXOL 350 MG/ML SOLN COMPARISON:  Plain head CT 1409 hours today. Carotid Doppler ultrasound 10/03/2016. Head MRA  10/04/2016. FINDINGS: CTA NECK Skeleton: Diffuse idiopathic skeletal hyperostosis (DISH). Bulky and flowing osteophytes throughout the visible spine. Absent maxillary dentition. No acute osseous abnormality identified. Upper chest: Mild upper lung scarring. Upper limits of normal superior mediastinal lymph nodes. Other neck: 2 cm anterior right thyroid lobe hypodense nodule which  meets size criteria for ultrasound follow-up (ref: J Am Coll Radiol. 2015 Feb;12(2): 143-50).The glottis is closed. Partial effaced pharynx also. Negative for neck mass or lymphadenopathy. Aortic arch: 3 vessel arch configuration with mild arch atherosclerosis. Right carotid system: Mild brachiocephalic atherosclerosis without stenosis. Negative right CCA origin. Mild calcified plaque proximal to the bifurcation. Moderate mixed soft and calcified plaque at the right ICA origin and bulb, but less than 50 % stenosis with respect to the distal vessel. Tortuous right ICA just below the skull. Left carotid system: Soft and calcified plaque at the left CCA origin with less than 50 % stenosis with respect to the distal vessel. Occasional mild calcified plaque proximal to the bifurcation. Bulky calcified plaque at the left ICA origin and bulb with up to 50 % stenosis with respect to the distal vessel. Tortuous left ICA just below the skull. Vertebral arteries: Proximal right subclavian artery plaque without stenosis. The right vertebral artery origin is difficult to identify with suspected at least moderate stenosis on series 7, image 179. The right vertebral is somewhat diminutive but otherwise patent to the skull base without stenosis. Mild proximal left subclavian artery plaque without stenosis. Dominant left vertebral artery. Similar to the right side evidence of moderate to severe left vertebral artery origin stenosis due to atherosclerosis on series 8, image 160. Some of the V1 segment is obscured by dense paravertebral venous contrast. But the left vertebral artery remains dominant and patent to the skull base without additional stenosis. CTA HEAD Posterior circulation: Mild left V4 calcified plaque without stenosis. Non dominant right vertebral artery functionally terminates in PICA. Both PICA vessels appear to be patent. Tortuous left vertebrobasilar junction and basilar artery. No basilar stenosis. Patent SCA and  PCA origins. Posterior communicating arteries are diminutive or absent. Bilateral PCA branches are within normal limits, the left mildly asymmetrically smaller in the setting of chronic left distal PCA infarct. Anterior circulation: Both ICA siphons are patent and tortuous. Bilateral siphon calcified plaque does not result in significant stenosis. Patent carotid termini, MCA and ACA origins. Codominant A1 segments. Anterior communicating artery and bilateral ACA branches are within normal limits. Left MCA M1 segment and bifurcation are patent without stenosis. Left MCA branches are within normal limits. Right MCA M1 segment and bifurcation are patent without stenosis. Right MCA branches are within normal limits. Venous sinuses: Patent. Anatomic variants: Dominant left vertebral artery which primarily supplies the basilar. Review of the MIP images confirms the above findings IMPRESSION: 1. Negative for large vessel occlusion. 2. 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 The left vertebral artery is dominant and primarily supplies the basilar without additional stenosis. Both PICA vessels appear to remain patent. 3. Bilateral cervical and intracranial carotid atherosclerosis. Up to 50% stenosis at the left ICA origin, no hemodynamically significant stenosis elsewhere. 4. A 2 cm right thyroid lobe nodule which meets size criteria for Thyroid Ultrasound follow-up. 5. Diffuse idiopathic skeletal hyperostosis (DISH). Electronically Signed   By: Genevie Ann M.D.   On: 09/12/2019 14:59   MR BRAIN WO CONTRAST  Result Date: 09/12/2019 CLINICAL DATA:  Ataxia, stroke suspected. EXAM: MRI HEAD WITHOUT CONTRAST TECHNIQUE: Multiplanar, multiecho pulse sequences of the  brain and surrounding structures were obtained without intravenous contrast. COMPARISON:  Noncontrast CT head and CT angiogram head/neck performed earlier the same day 09/12/2019 FINDINGS: Brain: There is cortical/subcortical  restricted diffusion consistent with acute infarction within the right PCA vascular territory involving the posteromedial right temporal lobe and right occipital lobe. This encompasses a region of approximately 6 cm. Patchy corresponding T2/FLAIR hyperintensity at this site. There is no significant mass effect at this time. Redemonstrated chronic cortically based infarct within the left MCA vascular territory within the left frontoparietal lobes. Mild chronic hemosiderin deposition at this site. Redemonstrated chronic left PCA territory cortically based occipital lobe infarct. Chronic lacunar infarct within the superior right cerebellar hemisphere. Mild ill-defined T2/FLAIR hyperintensity within the pons consistent with chronic small ischemic changes. Mild generalized parenchymal atrophy. Incidentally noted cavum septum pellucidum and cavum vergae. No evidence of intracranial mass. No chronic intracranial blood products. No extra-axial fluid collection. No midline shift. Vascular: Expected proximal arterial flow voids. Skull and upper cervical spine: No focal marrow lesion. Sinuses/Orbits: Visualized orbits show no acute finding. Mild ethmoid and maxillary sinus mucosal thickening. No significant mastoid effusion IMPRESSION: 1. Acute cortical/subcortical infarct within the posteromedial right temporal lobe and right occipital lobe within the right PCA vascular territory. 2. Redemonstrated chronic infarcts within the left frontoparietal lobes, left occipital lobe and superior right cerebellum. 3. Mild chronic small vessel ischemic changes within the pons. 4. Mild generalized parenchymal atrophy. 5. Mild paranasal sinus mucosal thickening. Electronically Signed   By: Kellie Simmering DO   On: 09/12/2019 18:19   ECHOCARDIOGRAM COMPLETE  Result Date: 09/13/2019    ECHOCARDIOGRAM REPORT   Patient Name:   IDRISSA Buenger Date of Exam: 09/13/2019 Medical Rec #:  [id number]          Height:       74.0 in Accession #:     [id number]         Weight:       214.0 lb Date of Birth:  1950/12/09           BSA:          2.237 m Patient Age:    49 years           BP:           168/92 mmHg Patient Gender: M                  HR:           66 bpm. Exam Location:  [facility] Procedure: 2D Echo, Cardiac Doppler and Color Doppler Indications:     Stroke 434.91  History:         Patient has prior history of Echocardiogram examinations, most                  recent 04/08/2019. Stroke, Arrythmias:Atrial Fibrillation; Risk                  Factors:Hypertension. Pulmonary embolism.  Sonographer:     Sherrie Sport RDCS (AE) Referring Phys:  [id number] Loletha [name] Diagnosing Phys: Bartholome Bill MD IMPRESSIONS  1. Left ventricular ejection fraction, by estimation, is 40 to 45%. The left ventricle has mildly decreased function. The left ventricle has no regional wall motion abnormalities. The left ventricular internal cavity size was mildly dilated. There is mild left ventricular hypertrophy. Left ventricular diastolic parameters are consistent with Grade I diastolic dysfunction (impaired relaxation).  2. Right ventricular systolic function is normal. The right ventricular size is mildly enlarged.  There is normal pulmonary artery systolic pressure.  3. Left atrial size was mildly dilated.  4. Right atrial size was mildly dilated.  5. The mitral valve is grossly normal. Mild mitral valve regurgitation.  6. The aortic valve is grossly normal. Aortic valve regurgitation is mild. FINDINGS  Left Ventricle: Left ventricular ejection fraction, by estimation, is 40 to 45%. The left ventricle has mildly decreased function. The left ventricle has no regional wall motion abnormalities. The left ventricular internal cavity size was mildly dilated. There is mild left ventricular hypertrophy. Left ventricular diastolic parameters are consistent with Grade I diastolic dysfunction (impaired relaxation). Right Ventricle: The right ventricular size is mildly enlarged. No increase in  right ventricular wall thickness. Right ventricular systolic function is normal. There is normal pulmonary artery systolic pressure. The tricuspid regurgitant velocity is 2.16  m/s, and with an assumed right atrial pressure of 10 mmHg, the estimated right ventricular systolic pressure is 59.1 mmHg. Left Atrium: Left atrial size was mildly dilated. Right Atrium: Right atrial size was mildly dilated. Pericardium: There is no evidence of pericardial effusion. Mitral Valve: The mitral valve is grossly normal. Mild mitral valve regurgitation. Tricuspid Valve: The tricuspid valve is grossly normal. Tricuspid valve regurgitation is mild. Aortic Valve: The aortic valve is grossly normal. Aortic valve regurgitation is mild. Aortic valve mean gradient measures 2.5 mmHg. Aortic valve peak gradient measures 4.2 mmHg. Aortic valve area, by VTI measures 2.79 cm. Pulmonic Valve: The pulmonic valve was not well visualized. Pulmonic valve regurgitation is trivial. Aorta: The aortic root is normal in size and structure. IAS/Shunts: The interatrial septum was not assessed.  LEFT VENTRICLE PLAX 2D LVIDd:         5.01 cm LVIDs:         4.11 cm LV PW:         1.38 cm LV IVS:        1.15 cm LVOT diam:     2.20 cm LV SV:         51 LV SV Index:   23 LVOT Area:     3.80 cm  RIGHT VENTRICLE RV Basal diam:  3.53 cm RV S prime:     9.57 cm/s TAPSE (M-mode): 4.3 cm LEFT ATRIUM              Index       RIGHT ATRIUM           Index LA diam:        4.70 cm  2.10 cm/m  RA Area:     39.70 cm LA Vol (A2C):   100.0 ml 44.71 ml/m RA Volume:   157.00 ml 70.19 ml/m LA Vol (A4C):   163.0 ml 72.87 ml/m LA Biplane Vol: 137.0 ml 61.25 ml/m  AORTIC VALVE                   PULMONIC VALVE AV Area (Vmax):    2.07 cm    RVOT Peak grad: 1 mmHg AV Area (Vmean):   2.01 cm AV Area (VTI):     2.79 cm AV Vmax:           103.00 cm/s AV Vmean:          69.550 cm/s AV VTI:            0.181 m AV Peak Grad:      4.2 mmHg AV Mean Grad:      2.5 mmHg LVOT Vmax:  56.10 cm/s LVOT Vmean:        36.800 cm/s LVOT VTI:          0.133 m LVOT/AV VTI ratio: 0.73  AORTA Ao Root diam: 3.10 cm MITRAL VALVE                TRICUSPID VALVE MV Area (PHT): 4.74 cm     TR Peak grad:   18.7 mmHg MV Decel Time: 160 msec     TR Vmax:        216.00 cm/s MV E velocity: 123.00 cm/s                             SHUNTS                             Systemic VTI:  0.13 m                             Systemic Diam: 2.20 cm Bartholome Bill MD Electronically signed by Bartholome Bill MD Signature Date/Time: 09/13/2019/11:29:55 AM    Final    CT HEAD CODE STROKE WO CONTRAST  Result Date: 09/12/2019 CLINICAL DATA:  Code stroke. EXAM: CT HEAD WITHOUT CONTRAST TECHNIQUE: Contiguous axial images were obtained from the base of the skull through the vertex without intravenous contrast. COMPARISON:  2019 FINDINGS: Brain: There is no acute intracranial hemorrhage, mass effect, or edema. There is no new loss of gray-white differentiation. Chronic left MCA territory infarction involving the frontoparietal lobes and insula again identified. Additional chronic left occipital infarct is again seen. There is a new small infarct of the superior right cerebellum. Otherwise, patchy hypoattenuation in the supratentorial white matter is nonspecific but may reflect stable chronic microvascular ischemic changes. The ventricles and sulci are within normal limits in size and configuration apart from volume loss associated with infarcts. Vascular: There is no hyperdense vessel. Mild intracranial atherosclerotic calcification is present at the skull base. Skull: Unremarkable. Sinuses/Orbits: No acute abnormality. Relative prominence of the left superior ophthalmic vein may reflect a varix. Other: Mastoid air cells are clear. ASPECTS ([place] Stroke Program Early CT Score) - Ganglionic level infarction (caudate, lentiform nuclei, internal capsule, insula, M1-M3 cortex): 7 - Supraganglionic infarction (M4-M6 cortex): 3 Total score  (0-10 with 10 being normal): 10, accounting for chronic infarct IMPRESSION: No acute intracranial hemorrhage or evidence of acute infarction. ASPECT score is 10 accounting for chronic infarct. New small infarct of the right cerebellum is favored to be chronic but age indeterminate. These results were called by telephone at the time of interpretation on 09/12/2019 at 2:17 pm to provider Lavonia Drafts , who verbally acknowledged these results. Electronically Signed   By: Macy Mis M.D.   On: 09/12/2019 14:25    Scheduled Meds: . allopurinol  300 mg Oral Daily  . amLODipine  5 mg Oral Daily  . apixaban  5 mg Oral BID  . atorvastatin  80 mg Oral QPM  . colchicine  0.6 mg Oral Daily  . [START ON 09/14/2019] furosemide  20 mg Oral Daily  . gabapentin  400 mg Oral TID  . insulin aspart  0-5 Units Subcutaneous QHS  . insulin aspart  0-9 Units Subcutaneous TID WC  . lidocaine  1 patch Transdermal Q12H  . losartan  100 mg Oral Daily  . metFORMIN  500 mg Oral BID WC  .  nicotine  7 mg Transdermal Daily  . potassium chloride  40 mEq Oral BID    Assessment/Plan:  1. 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  Patient already on Eliquis.  I switched Zocor over to high-dose atorvastatin.  Physical therapy recommending CIR. 2. Worsening vision overnight.  Case discussed with neurology and we will get a stat CT scan of the head to make sure he did not have a hemorrhage.  Also messaged ophthalmology to see. 3. Accelerated hypertension today.  Restart Cozaar and give Norvasc.  Had to hold the patient's beta-blocker secondary to bradycardia. 4. Bradycardia and 2.5-second pause.  Hold metoprolol continue to monitor on telemetry 5. Chronic atrial fibrillation.  Had low rates and pause overnight holding beta-blocker at this time.  On Eliquis for anticoagulation 6. Type 2 diabetes mellitus with hyperlipidemia.  Zocor switched over to Lipitor.  Hemoglobin A1c excellent at  6.2. 7. Tobacco abuse on nicotine patch 8. Few beats of nonsustained ventricular tachycardia I did give IV magnesium and oral potassium  Code Status:     Code Status Orders  (From admission, onward)         Start     Ordered   09/12/19 1541  Full code  Continuous     09/12/19 1542        Code Status History    Date Active Date Inactive Code Status Order ID Comments User Context   04/07/2019 1210 04/11/2019 2052 Full Code [id number]  Para Skeans, MD ED   06/22/2018 1457 06/24/2018 1553 Full Code [id number]  Vaughan Basta, MD Inpatient   03/03/2017 1541 03/04/2017 1545 Full Code [id number]  Demetrios Loll, MD Inpatient   10/03/2016 1535 10/05/2016 1535 Full Code [id number]  [name], MD Inpatient   10/12/2014 0953 10/14/2014 1514 Full Code [id number]  Gladstone Lighter, MD Inpatient   Advance Care Planning Activity     Family Communication: Spoke with the patient's friend Farrel Gobble at the bedside Disposition Plan: We will have transitional care team look into to possibility of CIR. With the patient's blood pressure being high today and bradycardia and pause overnight I will watch here in the hospital at least another day.  Consultants:  Neurology  Consult ophthalmology  Time spent: 30 minutes  [place]

## 2019-09-13 NOTE — TOC Initial Note (Signed)
Transition of Care [facility]) - Initial/Assessment Note    Patient Details  Name: [name] MRN: [id number] Date of Birth: [birth year]  Transition of [facility]) CM/SW Contact:    [name], LCSW Phone Number: 09/13/2019, 2:31 PM  Clinical Narrative:  Pt lives alone and was independent prior to this stroke. He used a cane but was driving and taking care of himself. He is concerned due to his vision has really been affected this time and it is concerning to him. He has a significant other-[name] who helps with his home management but does not live with him. He wants to recover and get back to his baseline function. He had a stroke in 2017 and has some weakness on his right side from this and some speech issues but his vision was not affected. He will do what is needed to recover and is open to CIR if his insurance covers it. Made aware a liaison and MD will be evaluating him for this. If unable to accept he may need to go to SNF to continue rehab before going home. Continue to work on safe discharge plan for pt.           Expected Discharge Plan: IP Rehab Facility Barriers to Discharge: Continued Medical Work up   Patient Goals and CMS Choice Patient states their goals for this hospitalization and ongoing recovery are:: I hope my vision gets better I can't well at all      Expected Discharge Plan and Services Expected Discharge Plan: [place] In-house Referral: Clinical Social Work   Post Acute Care Choice: IP Rehab Living arrangements for the past 2 months: Apartment                                      Prior Living Arrangements/Services Living arrangements for the past 2 months: Apartment Lives with:: Self Patient language and need for interpreter reviewed:: No Do you feel safe going back to the place where you live?: Yes      Need for Family Participation in Patient Care: No (Comment) Care giver support system in place?: Yes (comment) Current home  services: DME(cane) Criminal Activity/Legal Involvement Pertinent to Current Situation/Hospitalization: No - Comment as needed  Activities of Daily Living Home Assistive Devices/Equipment: Cane (specify quad or straight) ADL Screening (condition at time of admission) Patient's cognitive ability adequate to safely complete daily activities?: Yes Is the patient deaf or have difficulty hearing?: No Does the patient have difficulty seeing, even when wearing glasses/contacts?: Yes(c/o blurred vision) Does the patient have difficulty concentrating, remembering, or making decisions?: No Patient able to express need for assistance with ADLs?: Yes Does the patient have difficulty dressing or bathing?: No Independently performs ADLs?: Yes (appropriate for developmental age) Does the patient have difficulty walking or climbing stairs?: Yes Weakness of Legs: Both Weakness of Arms/Hands: Both  Permission Sought/Granted Permission sought to share information with : Family Supports, Chartered certified accountant granted to share information with : Yes, Verbal Permission Granted  Share Information with NAME: [name]  Permission granted to share info w AGENCY: CIR  Permission granted to share info w Relationship: Significant other     Emotional Assessment Appearance:: Appears stated age Attitude/Demeanor/Rapport: Guarded, Gracious Affect (typically observed): Adaptable, Accepting Orientation: : Oriented to Self, Oriented to Place, Oriented to  Time, Oriented to Situation Alcohol / Substance Use: Tobacco Use Psych Involvement: No (comment)  Admission  diagnosis:  Dizziness [R42] Cerebrovascular accident (CVA), unspecified mechanism ([place]) [I63.9] CVA (cerebral vascular accident) [facility]) [I63.9] Patient Active Problem List   Diagnosis Date Noted  . CVA (cerebral vascular accident) ([place]) 09/13/2019  . Vision loss, bilateral   . Tobacco abuse   . Bradycardia   . Non-sustained  ventricular tachycardia ([place])   . Dizziness 09/12/2019  . Nonintractable headache   . Slurred speech   . Type 2 diabetes mellitus with hyperlipidemia ([place])   . PAD (peripheral artery disease) ([place]) 05/20/2019  . Atrial fibrillation, chronic ([place]) 04/08/2019  . Fever 04/08/2019  . Sepsis ([place]) 04/08/2019  . Abdominal pain 04/07/2019  . 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 Pain due to onychomycosis of toenails of both feet 11/20/2018  . Diabetic neuropathy ([place]) 11/20/2018  . Influenza A 06/22/2018  . Community acquired pneumonia 06/22/2018  . Arthritis 05/12/2017  . Varicose veins of leg with swelling, bilateral 01/18/2017  . Facial twitching   . 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 Pneumonia 10/12/2014  . Osteoarthritis of right knee 02/11/2014  . Pain and swelling of knee 01/08/2014  . Pain in extremity 01/08/2014  . Weakness due to cerebrovascular accident 01/08/2014  . Pulmonary embolism ([place]) 06/19/2012  . Gout 09/22/2011  . 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 CVA (cerebral infarction) 04/26/2011  . Diabetes mellitus without complication ([place]) 55/37/4827  . Accelerated hypertension 04/26/2011  . Anemia 04/26/2011  . DVT (deep venous thrombosis) ([place]) 04/26/2011  . Essential hypertension 04/26/2011   PCP:  Theotis Burrow, MD Pharmacy:   [facility] [address]), [place] [employer]) [place] [address] Phone: [phone] Fax: [phone]     Social Determinants of Health (SDOH) Interventions    Readmission Risk Interventions No flowsheet data found.

## 2019-09-13 NOTE — Progress Notes (Addendum)
    BRIEF OVERNIGHT PROGRESS REPORT   SUBJECTIVE:Per telemetry tech patient heart went to 33 with multiple pauses, longest pause 2.47 seconds.   OBJECTIVE: He is afebrile with blood pressure 147/110 mm Hg and pulse rate 50 beats/min. There were no focal neurological deficits; he was alert and oriented x4, without other associated symptoms.  BRIEF PATIENT DESCRIPTION: 69 y.o male with PMH of CVA, DVT, Afib, DM, thrombocytopenia, and HTN who was admitted with stroke like symptoms dizziness, headache and speech difficulty.  ASSESSMENT/PLAN: Dizziness -MRI Brain showed an ACUTE infarct of the right occipital lobe, along with CHRONIC infarcts of the left occipital lobe. [name] CTA head and neck negative for LVO. * - Repeat EKG done due to abnormality on telemetry showing HR 33 with multiple pauses. EKG shows afib with slow ventricular response?  - Echo pending - Given initial presentation with dizziness will place consult to cardiology for further evaluation.     Rufina Falco, DNP, CCRN, FNP-C [facility] Nurse Practitioner

## 2019-09-13 NOTE — Consult Note (Signed)
Cardiology Consultation Note    Patient ID: [name], MRN: [id number], DOB/AGE: 71-May-1952 71 y.o. Admit date: 09/12/2019   Date of Consult: 09/13/2019 Primary Physician: Theotis Burrow, MD Primary Cardiologist: Dr. Clayborn Bigness  Chief Complaint: dizzy/blurred vision Reason for Consultation: bradycardia Requesting MD: Dr. Leslye Peer  HPI: [name] is a 69 y.o. male with history of 69 year old male with history of chronic atrial fibrillation anticoagulated with Eliquis which he states he is compliant with twice daily, diabetes mellitus, hypertension, use of beta-blockers as an outpatient who presented to the emergency room with complaints of dizziness and lightheadedness.  In the emergency room he was relatively hypokalemic with a potassium of 3.2 and a creatinine of 0.93.  His PT and APTT were normal.  EKG revealed atrial fibrillation with slow ventricular response with a ventricular rate in the mid 50s.  He was hemodynamically stable.  On telemetry he developed transient bradycardia and a 2.5-second pause while sleeping.  This a.m. patient continues to complain of dizziness and visual changes.  He was taken off of his beta-blocker last p.m.  Brain MRI revealed acute cortical/subcortical infarct with posterior medial right temporal lobe and right occipital lobe affects within the right PCA vascular territory.  These were new from previous evaluation.  There was redemonstrated chronic infarcts in the left frontoparietal left occipital and right superior cerebellum.  He denies chest pain or shortness of breath.  Past Medical History:  Diagnosis Date  . Arthritis   . Atrial fibrillation ([place])   . Cocaine abuse ([place])    + UDS on admission  . Diabetes mellitus without complication ([place])   . Gout current and history of  . History of deep vein thrombosis 2006  . History of tobacco abuse    has smoked for 50 years  . Hypertension   . Pulmonary embolism ([place]) 2010  . Stroke  [facility]) 2017   affected speech and right side-uses cane      Surgical History:  Past Surgical History:  Procedure Laterality Date  . COLONOSCOPY    . OLECRANON BURSECTOMY Left 04/02/2019   Procedure: OLECRANON BURSA;  Surgeon: Earnestine Leys, MD;  Location: [facility] ORS;  Service: Orthopedics;  Laterality: Left;  [name] VASCULAR SURGERY     Stent in leg     Home Meds: Prior to Admission medications   Medication Sig Start Date End Date Taking? Authorizing Provider  allopurinol (ZYLOPRIM) 300 MG tablet Take 300 mg by mouth daily. 04/30/19  Yes [provider]  apixaban (ELIQUIS) 5 MG TABS tablet Take 1 tablet (5 mg total) by mouth 2 (two) times daily. 04/18/19  Yes Ivor Costa, MD  Aspirin-Calcium Carbonate [phone] MG TABS Take 1 tablet by mouth daily.   Yes [provider]  colchicine 0.6 MG tablet Take 0.6 mg by mouth daily as needed (gout flare).  08/29/19  Yes [provider]  furosemide (LASIX) 40 MG tablet Take 1 tablet (40 mg total) by mouth daily for 30 days. Patient taking differently: Take 40 mg by mouth 2 (two) times daily.  06/24/18 03/18/20 Yes Pyreddy, Reatha Harps, MD  losartan (COZAAR) 100 MG tablet Take 50 mg by mouth daily.    Yes [provider]  metoprolol tartrate (LOPRESSOR) 25 MG tablet Take 25 mg by mouth 2 (two) times daily.   Yes [provider]  polyethylene glycol (MIRALAX / GLYCOLAX) 17 g packet Take 17 g by mouth daily as needed for mild constipation. 04/11/19  Yes Ivor Costa, MD  simvastatin [facility])  20 MG tablet Take 20 mg by mouth at bedtime. 02/07/18  Yes [provider]    Inpatient Medications:  . allopurinol  300 mg Oral Daily  . apixaban  5 mg Oral BID  . atorvastatin  80 mg Oral QPM  . colchicine  0.6 mg Oral Daily  . furosemide  40 mg Oral BID  . gabapentin  400 mg Oral TID  . insulin aspart  0-5 Units Subcutaneous QHS  . insulin aspart  0-9 Units Subcutaneous TID WC  . lidocaine  1 patch Transdermal Q12H  .  metFORMIN  500 mg Oral BID WC  . nicotine  7 mg Transdermal Daily  . potassium chloride  40 mEq Oral BID   . magnesium sulfate bolus IVPB      Allergies: No Known Allergies  Social History   Socioeconomic History  . Marital status: Divorced    Spouse name: Not on file  . Number of children: Not on file  . Years of education: Not on file  . Highest education level: Not on file  Occupational History  . Not on file  Tobacco Use  . Smoking status: Current Every Day Smoker    Packs/day: 0.50    Years: 50.00    Pack years: 25.00    Types: Cigarettes  . Smokeless tobacco: Never Used  Substance and Sexual Activity  . Alcohol use: No  . Drug use: No    Comment: Urinainary drug screen + cocaine on admit-last used cocaine in 2015  . Sexual activity: Not Currently  Other Topics Concern  . Not on file  Social History Narrative   Lives at home by himself. Girlfriend lives close by   Social Determinants of Health   Financial Resource Strain:   . Difficulty of Paying Living Expenses:   Food Insecurity:   . Worried About Charity fundraiser in the Last Year:   . Arboriculturist in the Last Year:   Transportation Needs:   . Film/video editor (Medical):   [name] Lack of Transportation (Non-Medical):   Physical Activity:   . Days of Exercise per Week:   . Minutes of Exercise per Session:   Stress:   . Feeling of Stress :   Social Connections:   . Frequency of Communication with Friends and Family:   . Frequency of Social Gatherings with Friends and Family:   . Attends Religious Services:   . Active Member of Clubs or Organizations:   . Attends Archivist Meetings:   [name] Marital Status:   Intimate Partner Violence:   . Fear of Current or Ex-Partner:   . Emotionally Abused:   [name] Physically Abused:   . Sexually Abused:      Family History  Problem Relation Age of Onset  . Aneurysm Mother   . Diabetes Father      Review of Systems: A 12-system review of systems  was performed and is negative except as noted in the HPI.  Labs: No results for input(s): CKTOTAL, CKMB, TROPONINI in the last 72 hours. Lab Results  Component Value Date   WBC 3.0 (L) 09/12/2019   HGB 11.7 (L) 09/12/2019   HCT 37.9 (L) 09/12/2019   MCV 81.5 09/12/2019   PLT 152 09/12/2019    Recent Labs  Lab 09/12/19 1420 09/12/19 1420 09/13/19 0524  NA 142   < > 139  K 3.2*   < > 3.2*  CL 106   < > 107  CO2 28   < >  27  BUN 16   < > 15  CREATININE 0.93   < > 0.95  CALCIUM 8.8*   < > 8.6*  PROT 6.6  --   --   BILITOT 0.7  --   --   ALKPHOS 72  --   --   ALT 17  --   --   AST 24  --   --   GLUCOSE 155*   < > 95   < > = values in this interval not displayed.   Lab Results  Component Value Date   CHOL 132 09/13/2019   HDL 50 09/13/2019   LDLCALC 72 09/13/2019   TRIG 48 09/13/2019   No results found for: DDIMER  Radiology/Studies:  CT ANGIO HEAD W OR WO CONTRAST  Result Date: 09/12/2019 CLINICAL DATA:  68 year old male code stroke presentation this afternoon. History of chronic cerebral infarcts with age indeterminate right cerebellar infarct on plain CT. EXAM: CT ANGIOGRAPHY HEAD AND NECK TECHNIQUE: Multidetector CT imaging of the head and neck was performed using the standard protocol during bolus administration of intravenous contrast. Multiplanar CT image reconstructions and MIPs were obtained to evaluate the vascular anatomy. Carotid stenosis measurements (when applicable) are obtained utilizing NASCET criteria, using the distal internal carotid diameter as the denominator. CONTRAST:  16mL OMNIPAQUE IOHEXOL 350 MG/ML SOLN COMPARISON:  Plain head CT 1409 hours today. Carotid Doppler ultrasound 10/03/2016. Head MRA 10/04/2016. FINDINGS: CTA NECK Skeleton: Diffuse idiopathic skeletal hyperostosis (DISH). Bulky and flowing osteophytes throughout the visible spine. Absent maxillary dentition. No acute osseous abnormality identified. Upper chest: Mild upper lung scarring.  Upper limits of normal superior mediastinal lymph nodes. Other neck: 2 cm anterior right thyroid lobe hypodense nodule which meets size criteria for ultrasound follow-up (ref: J Am Coll Radiol. 2015 Feb;12(2): 143-50).The glottis is closed. Partial effaced pharynx also. Negative for neck mass or lymphadenopathy. Aortic arch: 3 vessel arch configuration with mild arch atherosclerosis. Right carotid system: Mild brachiocephalic atherosclerosis without stenosis. Negative right CCA origin. Mild calcified plaque proximal to the bifurcation. Moderate mixed soft and calcified plaque at the right ICA origin and bulb, but less than 50 % stenosis with respect to the distal vessel. Tortuous right ICA just below the skull. Left carotid system: Soft and calcified plaque at the left CCA origin with less than 50 % stenosis with respect to the distal vessel. Occasional mild calcified plaque proximal to the bifurcation. Bulky calcified plaque at the left ICA origin and bulb with up to 50 % stenosis with respect to the distal vessel. Tortuous left ICA just below the skull. Vertebral arteries: Proximal right subclavian artery plaque without stenosis. The right vertebral artery origin is difficult to identify with suspected at least moderate stenosis on series 7, image 179. The right vertebral is somewhat diminutive but otherwise patent to the skull base without stenosis. Mild proximal left subclavian artery plaque without stenosis. Dominant left vertebral artery. Similar to the right side evidence of moderate to severe left vertebral artery origin stenosis due to atherosclerosis on series 8, image 160. Some of the V1 segment is obscured by dense paravertebral venous contrast. But the left vertebral artery remains dominant and patent to the skull base without additional stenosis. CTA HEAD Posterior circulation: Mild left V4 calcified plaque without stenosis. Non dominant right vertebral artery functionally terminates in PICA. Both PICA  vessels appear to be patent. Tortuous left vertebrobasilar junction and basilar artery. No basilar stenosis. Patent SCA and PCA origins. Posterior communicating arteries are diminutive or  absent. Bilateral PCA branches are within normal limits, the left mildly asymmetrically smaller in the setting of chronic left distal PCA infarct. Anterior circulation: Both ICA siphons are patent and tortuous. Bilateral siphon calcified plaque does not result in significant stenosis. Patent carotid termini, MCA and ACA origins. Codominant A1 segments. Anterior communicating artery and bilateral ACA branches are within normal limits. Left MCA M1 segment and bifurcation are patent without stenosis. Left MCA branches are within normal limits. Right MCA M1 segment and bifurcation are patent without stenosis. Right MCA branches are within normal limits. Venous sinuses: Patent. Anatomic variants: Dominant left vertebral artery which primarily supplies the basilar. Review of the MIP images confirms the above findings IMPRESSION: 1. Negative for large vessel occlusion. 2. 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 The left vertebral artery is dominant and primarily supplies the basilar without additional stenosis. Both PICA vessels appear to remain patent. 3. Bilateral cervical and intracranial carotid atherosclerosis. Up to 50% stenosis at the left ICA origin, no hemodynamically significant stenosis elsewhere. 4. A 2 cm right thyroid lobe nodule which meets size criteria for Thyroid Ultrasound follow-up. 5. Diffuse idiopathic skeletal hyperostosis (DISH). Electronically Signed   By: Genevie Ann M.D.   On: 09/12/2019 14:59   CT ANGIO NECK W OR WO CONTRAST  Result Date: 09/12/2019 CLINICAL DATA:  69 year old male code stroke presentation this afternoon. History of chronic cerebral infarcts with age indeterminate right cerebellar infarct on plain CT. EXAM: CT ANGIOGRAPHY HEAD AND NECK TECHNIQUE:  Multidetector CT imaging of the head and neck was performed using the standard protocol during bolus administration of intravenous contrast. Multiplanar CT image reconstructions and MIPs were obtained to evaluate the vascular anatomy. Carotid stenosis measurements (when applicable) are obtained utilizing NASCET criteria, using the distal internal carotid diameter as the denominator. CONTRAST:  17mL OMNIPAQUE IOHEXOL 350 MG/ML SOLN COMPARISON:  Plain head CT 1409 hours today. Carotid Doppler ultrasound 10/03/2016. Head MRA 10/04/2016. FINDINGS: CTA NECK Skeleton: Diffuse idiopathic skeletal hyperostosis (DISH). Bulky and flowing osteophytes throughout the visible spine. Absent maxillary dentition. No acute osseous abnormality identified. Upper chest: Mild upper lung scarring. Upper limits of normal superior mediastinal lymph nodes. Other neck: 2 cm anterior right thyroid lobe hypodense nodule which meets size criteria for ultrasound follow-up (ref: J Am Coll Radiol. 2015 Feb;12(2): 143-50).The glottis is closed. Partial effaced pharynx also. Negative for neck mass or lymphadenopathy. Aortic arch: 3 vessel arch configuration with mild arch atherosclerosis. Right carotid system: Mild brachiocephalic atherosclerosis without stenosis. Negative right CCA origin. Mild calcified plaque proximal to the bifurcation. Moderate mixed soft and calcified plaque at the right ICA origin and bulb, but less than 50 % stenosis with respect to the distal vessel. Tortuous right ICA just below the skull. Left carotid system: Soft and calcified plaque at the left CCA origin with less than 50 % stenosis with respect to the distal vessel. Occasional mild calcified plaque proximal to the bifurcation. Bulky calcified plaque at the left ICA origin and bulb with up to 50 % stenosis with respect to the distal vessel. Tortuous left ICA just below the skull. Vertebral arteries: Proximal right subclavian artery plaque without stenosis. The right  vertebral artery origin is difficult to identify with suspected at least moderate stenosis on series 7, image 179. The right vertebral is somewhat diminutive but otherwise patent to the skull base without stenosis. Mild proximal left subclavian artery plaque without stenosis. Dominant left vertebral artery. Similar to the right side evidence of moderate to  severe left vertebral artery origin stenosis due to atherosclerosis on series 8, image 160. Some of the V1 segment is obscured by dense paravertebral venous contrast. But the left vertebral artery remains dominant and patent to the skull base without additional stenosis. CTA HEAD Posterior circulation: Mild left V4 calcified plaque without stenosis. Non dominant right vertebral artery functionally terminates in PICA. Both PICA vessels appear to be patent. Tortuous left vertebrobasilar junction and basilar artery. No basilar stenosis. Patent SCA and PCA origins. Posterior communicating arteries are diminutive or absent. Bilateral PCA branches are within normal limits, the left mildly asymmetrically smaller in the setting of chronic left distal PCA infarct. Anterior circulation: Both ICA siphons are patent and tortuous. Bilateral siphon calcified plaque does not result in significant stenosis. Patent carotid termini, MCA and ACA origins. Codominant A1 segments. Anterior communicating artery and bilateral ACA branches are within normal limits. Left MCA M1 segment and bifurcation are patent without stenosis. Left MCA branches are within normal limits. Right MCA M1 segment and bifurcation are patent without stenosis. Right MCA branches are within normal limits. Venous sinuses: Patent. Anatomic variants: Dominant left vertebral artery which primarily supplies the basilar. Review of the MIP images confirms the above findings IMPRESSION: 1. Negative for large vessel occlusion. 2. 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 The left  vertebral artery is dominant and primarily supplies the basilar without additional stenosis. Both PICA vessels appear to remain patent. 3. Bilateral cervical and intracranial carotid atherosclerosis. Up to 50% stenosis at the left ICA origin, no hemodynamically significant stenosis elsewhere. 4. A 2 cm right thyroid lobe nodule which meets size criteria for Thyroid Ultrasound follow-up. 5. Diffuse idiopathic skeletal hyperostosis (DISH). Electronically Signed   By: Genevie Ann M.D.   On: 09/12/2019 14:59   MR BRAIN WO CONTRAST  Result Date: 09/12/2019 CLINICAL DATA:  Ataxia, stroke suspected. EXAM: MRI HEAD WITHOUT CONTRAST TECHNIQUE: Multiplanar, multiecho pulse sequences of the brain and surrounding structures were obtained without intravenous contrast. COMPARISON:  Noncontrast CT head and CT angiogram head/neck performed earlier the same day 09/12/2019 FINDINGS: Brain: There is cortical/subcortical restricted diffusion consistent with acute infarction within the right PCA vascular territory involving the posteromedial right temporal lobe and right occipital lobe. This encompasses a region of approximately 6 cm. Patchy corresponding T2/FLAIR hyperintensity at this site. There is no significant mass effect at this time. Redemonstrated chronic cortically based infarct within the left MCA vascular territory within the left frontoparietal lobes. Mild chronic hemosiderin deposition at this site. Redemonstrated chronic left PCA territory cortically based occipital lobe infarct. Chronic lacunar infarct within the superior right cerebellar hemisphere. Mild ill-defined T2/FLAIR hyperintensity within the pons consistent with chronic small ischemic changes. Mild generalized parenchymal atrophy. Incidentally noted cavum septum pellucidum and cavum vergae. No evidence of intracranial mass. No chronic intracranial blood products. No extra-axial fluid collection. No midline shift. Vascular: Expected proximal arterial flow voids.  Skull and upper cervical spine: No focal marrow lesion. Sinuses/Orbits: Visualized orbits show no acute finding. Mild ethmoid and maxillary sinus mucosal thickening. No significant mastoid effusion IMPRESSION: 1. Acute cortical/subcortical infarct within the posteromedial right temporal lobe and right occipital lobe within the right PCA vascular territory. 2. Redemonstrated chronic infarcts within the left frontoparietal lobes, left occipital lobe and superior right cerebellum. 3. Mild chronic small vessel ischemic changes within the pons. 4. Mild generalized parenchymal atrophy. 5. Mild paranasal sinus mucosal thickening. Electronically Signed   By: Kellie Simmering DO   On: 09/12/2019 18:19  CT HEAD CODE STROKE WO CONTRAST  Result Date: 09/12/2019 CLINICAL DATA:  Code stroke. EXAM: CT HEAD WITHOUT CONTRAST TECHNIQUE: Contiguous axial images were obtained from the base of the skull through the vertex without intravenous contrast. COMPARISON:  2019 FINDINGS: Brain: There is no acute intracranial hemorrhage, mass effect, or edema. There is no new loss of gray-white differentiation. Chronic left MCA territory infarction involving the frontoparietal lobes and insula again identified. Additional chronic left occipital infarct is again seen. There is a new small infarct of the superior right cerebellum. Otherwise, patchy hypoattenuation in the supratentorial white matter is nonspecific but may reflect stable chronic microvascular ischemic changes. The ventricles and sulci are within normal limits in size and configuration apart from volume loss associated with infarcts. Vascular: There is no hyperdense vessel. Mild intracranial atherosclerotic calcification is present at the skull base. Skull: Unremarkable. Sinuses/Orbits: No acute abnormality. Relative prominence of the left superior ophthalmic vein may reflect a varix. Other: Mastoid air cells are clear. ASPECTS ([place] Stroke Program Early CT Score) - Ganglionic  level infarction (caudate, lentiform nuclei, internal capsule, insula, M1-M3 cortex): 7 - Supraganglionic infarction (M4-M6 cortex): 3 Total score (0-10 with 10 being normal): 10, accounting for chronic infarct IMPRESSION: No acute intracranial hemorrhage or evidence of acute infarction. ASPECT score is 10 accounting for chronic infarct. New small infarct of the right cerebellum is favored to be chronic but age indeterminate. These results were called by telephone at the time of interpretation on 09/12/2019 at 2:17 pm to provider Lavonia Drafts , who verbally acknowledged these results. Electronically Signed   By: Macy Mis M.D.   On: 09/12/2019 14:25    Wt Readings from Last 3 Encounters:  09/12/19 97.1 kg  06/29/19 91.6 kg  04/30/19 96.2 kg    EKG: Atrial fibrillation with slow ventricular response.  No ischemia  Physical Exam:  Blood pressure (!) 175/108, pulse (!) 55, temperature 97.8 F (36.6 C), temperature source Oral, resp. rate 16, height 6\' 2"  (1.88 m), weight 97.1 kg, SpO2 100 %. Body mass index is 27.48 kg/m. General: Well developed, well nourished, in no acute distress. Head: Normocephalic, atraumatic, sclera non-icteric, no xanthomas, nares are without discharge.  Neck: Negative for carotid bruits. JVD not elevated. Lungs: Clear bilaterally to auscultation without wheezes, rales, or rhonchi. Breathing is unlabored. Heart: Irregularly irregular rhythm Abdomen: Soft, non-tender, non-distended with normoactive bowel sounds. No hepatomegaly. No rebound/guarding. No obvious abdominal masses. Msk:  Strength and tone appear normal for age. Extremities: No clubbing or cyanosis. No edema.  Distal pedal pulses are 2+ and equal bilaterally. Neuro: Alert and oriented X 3.  Left facial weakness.  Blurred vision.  Speech somewhat slow. Psych:  Responds to questions appropriately with a normal affect.     Assessment and Plan  69 year old male with history of chronic atrial fibrillation  treated with anticoagulation and rate control with metoprolol, history of COPD, history of mildly reduced LV function by echocardiogram.  EF approximately 40% which is not change from previous echo.  Presented with dizziness and visual changes noted to have a right acute posterior medial temporal lobe and right occipital lobe CVA in the right posterior cerebral artery vascular territory.  1.  Atrial fibrillation-states he is compliant with Eliquis.  His PT and PTT are normal.  While these values do not allow following of the levels, typically PTT is somewhat prolonged when patient is compliant with Eliquis.  Restressed the importance of compliance with this.  Will need to continue with this  unless contraindicated from a neurologic standpoint.  Would agree with holding metoprolol due to relative bradycardia.  If the patient develops tachycardia, consideration for carvedilol or lower dose metoprolol would be raised.  Patient did have pauses at night however this may suggest sleep apnea.  As mentioned above would defer using beta-blockers for now and consider outpatient work-up for obstructive sleep apnea to guide further therapy.  Does not appear to require pacemaker therapy at this point.  Will likely need a Holter monitor after discharge as an outpatient.  2.  CVA-we will defer to neurology input.  3.  Cardiomyopathy-mild cardiomyopathy EF around 40%.  No evidence of congestive heart failure clinically.  Would continue to follow.  Low-sodium diet.  4.  Diabetes mellitus-continue with medical management.  5.  Hypertension-continue with careful diuresis following hemodynamics and electrolytes.  6.  Hyperlipidemia-continue with atorvastatin 80 mg daily.  LDL goal of 70.  This can be followed as an outpatient.  Signed, Teodoro Spray MD 09/13/2019, 9:57 AM Pager: [phone]

## 2019-09-13 NOTE — Plan of Care (Signed)
  Problem: Clinical Measurements: Goal: Cardiovascular complication will be avoided Outcome: Not Progressing Note: On telemetry, bradycardiac HR 33, multiple heart pause longest pause 2.52 seconds.  NP [place] notified

## 2019-09-13 NOTE — Progress Notes (Signed)
OT Cancellation Note  Patient Details Name: [name] MRN: [id number] DOB: 1950/06/02   Cancelled Treatment:    Reason Eval/Treat Not Completed: Patient at procedure or test/ unavailable. Consult received, chart reviewed. Pt receiving echocardiogram, unavailable for OT evaluation at this time. Will re-attempt at later time as pt is available and medically appropriate.   Jeni Salles, MPH, MS, OTR/L ascom [phone] 09/13/19, 9:27 AM

## 2019-09-14 DIAGNOSIS — I482 Chronic atrial fibrillation, unspecified: Secondary | ICD-10-CM

## 2019-09-14 LAB — GLUCOSE, CAPILLARY
Glucose-Capillary: 101 mg/dL — ABNORMAL HIGH (ref 70–99)
Glucose-Capillary: 121 mg/dL — ABNORMAL HIGH (ref 70–99)
Glucose-Capillary: 101 mg/dL — ABNORMAL HIGH (ref 70–99)
Glucose-Capillary: 102 mg/dL — ABNORMAL HIGH (ref 70–99)

## 2019-09-14 LAB — BASIC METABOLIC PANEL
Anion gap: 8 (ref 5–15)
BUN: 13 mg/dL (ref 8–23)
CO2: 27 mmol/L (ref 22–32)
Calcium: 9 mg/dL (ref 8.9–10.3)
Chloride: 101 mmol/L (ref 98–111)
Creatinine, Ser: 0.97 mg/dL (ref 0.61–1.24)
GFR calc Af Amer: >60 mL/min (ref >60)
GFR calc non Af Amer: >60 mL/min (ref >60)
Glucose, Bld: 102 mg/dL — ABNORMAL HIGH (ref 70–99)
Potassium: 3.9 mmol/L (ref 3.5–5.1)
Sodium: 136 mmol/L (ref 135–145)

## 2019-09-14 LAB — HIV ANTIBODY (ROUTINE TESTING W REFLEX): HIV Screen 4th Generation wRfx: NONREACTIVE

## 2019-09-14 LAB — MAGNESIUM: Magnesium: 1.9 mg/dL (ref 1.7–2.4)

## 2019-09-14 MED ORDER — AMLODIPINE BESYLATE 10 MG PO TABS
10.0000 mg | ORAL_TABLET | Freq: Every day | ORAL | Status: DC
  Administered 2019-09-14 – 2019-09-16 (×3): 10 mg via ORAL
  Filled 2019-09-14 (×4): qty 1

## 2019-09-14 MED ORDER — HYDRALAZINE HCL 50 MG PO TABS
50.0000 mg | ORAL_TABLET | Freq: Three times a day (TID) | ORAL | Status: DC
  Administered 2019-09-14 – 2019-09-15 (×5): 50 mg via ORAL
  Filled 2019-09-14 (×5): qty 1

## 2019-09-14 MED ORDER — HYDRALAZINE HCL 25 MG PO TABS: 25.0000 mg | ORAL_TABLET | Freq: Three times a day (TID) | ORAL | Status: DC

## 2019-09-14 NOTE — Progress Notes (Signed)
Inpatient Rehab Admissions:  Inpatient Rehab Consult received.  I spoke with pt over the phone for rehabilitation assessment and to discuss goals and expectations of an inpatient rehab admission.  Difficult to tell whether he has word finding difficulty or just difficult to understand over the phone due to accent.  SLP eval pending.  Note pt already mobilizing at min assist or better and will need insurance authorization for potential CIR admission (expect several days to hear determination from [place]).  Pt may progress past needing CIR, but will open insurance and f/u with pt on Monday to see how he's progressed over the weekend.    Signed: Shann Medal, PT, DPT Admissions Coordinator [phone] 09/14/19  11:05 AM

## 2019-09-14 NOTE — TOC Progression Note (Signed)
Transition of Care [facility]) - Progression Note    Patient Details  Name: [name] MRN: [id number] Date of Birth: [birth year]  [facility] CM/SW Contact  Heidie Krall, Gardiner Rhyme, LCSW Phone Number: 09/14/2019, 12:14 PM  Clinical Narrative: Pt aware trying to get insurance auth for CIR. He spoke with [place]-liaison today and would like to pursue this if insurance will approve. The other option is to go to a SNF, will do FL2 and try to work on both angles although can only have insurance auth one at a time. [place] working on [employer] fast streamer for [employer] admission     Expected Discharge Plan: [place] Barriers to Discharge: Continued Medical Work up  [employer] and Services Expected Discharge Plan: [place] In-house Referral: Clinical Social Work   Post Acute Care Choice: IP Rehab Living arrangements for the past 2 months: Apartment                                       Social Determinants of Health (SDOH) Interventions    Readmission Risk Interventions No flowsheet data found.

## 2019-09-14 NOTE — Plan of Care (Signed)
  Problem: Education: Goal: Knowledge of General Education information will improve Description: Including pain rating scale, medication(s)/side effects and non-pharmacologic comfort measures Outcome: Progressing   Problem: Clinical Measurements: Goal: Ability to maintain clinical measurements within normal limits will improve Outcome: Progressing Goal: Will remain free from infection Outcome: Progressing Goal: Diagnostic test results will improve Outcome: Progressing Goal: Respiratory complications will improve Outcome: Progressing Goal: Cardiovascular complication will be avoided Outcome: Progressing   Problem: Activity: Goal: Risk for activity intolerance will decrease Outcome: Progressing   Problem: Coping: Goal: Level of anxiety will decrease Outcome: Progressing   Problem: Pain Managment: Goal: General experience of comfort will improve Outcome: Progressing   Problem: Safety: Goal: Ability to remain free from injury will improve Outcome: Progressing   

## 2019-09-14 NOTE — Evaluation (Signed)
Speech Language Pathology Evaluation Patient Details Name: [name] MRN: [id number] DOB: 1951/04/14 Today's Date: 09/14/2019 Time: 9562-1308 SLP Time Calculation (min) (ACUTE ONLY): 36 min  Problem List:  Patient Active Problem List   Diagnosis Date Noted  . CVA (cerebral vascular accident) ([place]) 09/13/2019  . Vision loss, bilateral   . Tobacco abuse   . Bradycardia   . Non-sustained ventricular tachycardia ([place])   . Dizziness 09/12/2019  . Nonintractable headache   . Slurred speech   . Type 2 diabetes mellitus with hyperlipidemia ([place])   . PAD (peripheral artery disease) ([place]) 05/20/2019  . Atrial fibrillation, chronic ([place]) 04/08/2019  . Fever 04/08/2019  . Sepsis ([place]) 04/08/2019  . Abdominal pain 04/07/2019  . 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 Pain due to onychomycosis of toenails of both feet 11/20/2018  . Diabetic neuropathy ([name]) 11/20/2018  . Influenza A 06/22/2018  . Community acquired pneumonia 06/22/2018  . Arthritis 05/12/2017  . Varicose veins of leg with swelling, bilateral 01/18/2017  . Facial twitching   . 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 Pneumonia 10/12/2014  . Osteoarthritis of right knee 02/11/2014  . Pain and swelling of knee 01/08/2014  . Pain in extremity 01/08/2014  . Weakness due to cerebrovascular accident 01/08/2014  . Pulmonary embolism ([place]) 06/19/2012  . Gout 09/22/2011  . 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 CVA (cerebral infarction) 04/26/2011  . Diabetes mellitus without complication ([place]) 65/78/4696  . Accelerated hypertension 04/26/2011  . Anemia 04/26/2011  . DVT (deep venous thrombosis) ([place]) 04/26/2011  . Essential hypertension 04/26/2011   Past Medical History:  Past Medical History:  Diagnosis Date  . Arthritis   . Atrial fibrillation ([place])   . Cocaine abuse ([place])    + UDS on admission  . Diabetes mellitus without complication ([place])   . Gout current  and history of  . History of deep vein thrombosis 2006  . History of tobacco abuse    has smoked for 50 years  . Hypertension   . Pulmonary embolism ([place]) 2010  . Stroke [facility]) 2017   affected speech and right side-uses cane   Past Surgical History:  Past Surgical History:  Procedure Laterality Date  . COLONOSCOPY    . OLECRANON BURSECTOMY Left 04/02/2019   Procedure: OLECRANON BURSA;  Surgeon: Earnestine Leys, MD;  Location: [facility] ORS;  Service: Orthopedics;  Laterality: Left;  [name] VASCULAR SURGERY     Stent in leg   HPI:  Pt admitted for HTN with positive R temporal/occipital lobe CVA. PMH includes prior CVA, DVT, Afib, DM, and HTN. Pt with complaints of dizziness, HA, and speech difficulties.    Assessment / Plan / Recommendation Clinical Impression  Pt's presents with word finding deficits as well as dysarthria that results in decreased ability to communicate. However, per chart review, pt, his nephew, and his girlfriend, pt's current cognitive linguistic abilities are at baseline. His speech is ~ 50% intelligible at the sentence level. To strangers within the community, yes/no questions have been most effective means of communication for pt. Despite this, pt was able to independently recall phone conversation with [facility] from [place] and asked appropriate questions about program. While pt continues to exhibit chronic deficits, he has been living with these chronic cognitive linguistic deficits in an independent environment. He appears to have good family support that ensure he has access to all community needs. He and his significant other voice desire to further rehab  at [facility] and are committed to continuing to support pt at discharge. Even though skilled ST doesn't require follow up services, pt would like to proceed with CIR process.     SLP Assessment  SLP Recommendation/Assessment: Patient does not need any further Speech Lanaguage Pathology Services SLP Visit Diagnosis:  Cognitive communication deficit (R41.841)    Follow Up Recommendations  (none for ST)    Frequency and Duration   N/A        SLP Evaluation Cognition  Overall Cognitive Status: Within Functional Limits for tasks assessed       Comprehension       Expression Expression Primary Mode of Expression: Verbal Verbal Expression Overall Verbal Expression: Impaired at baseline(dysarthria and word finding deficits - at baseline)   Oral / Motor  Oral Motor/Sensory Function Overall Oral Motor/Sensory Function: (at baseline)   GO                   Deny Chevez B. Rutherford Nail M.S., CCC-SLP, [place] Office [phone]   Stormy Fabian 09/14/2019, 3:11 PM

## 2019-09-14 NOTE — Progress Notes (Signed)
Occupational Therapy Treatment Patient Details Name: [name] MRN: [id number] DOB: 06/05/1950 Today's Date: 09/14/2019    History of present illness Pt admitted for HTN with positive R temporal/occipital lobe CVA. PMH includes prior CVA, DVT, Afib, DM, and HTN. Pt with complaints of dizziness, HA, and speech difficulties.    OT comments  Pt seen for OT tx this date. Pt eager to participate. Seated EOB upon arrival finishing breakfast. Pt noted with significant spillage, on tray, minimally disturbed items on L side of tray. With cues pt able to negotiate to grasp items on L side. Difficulty with [facility] tasks including opening containers/packets requiring assist. Pt endorses dizziness and headache. BP 143/90, relayed to RN. Pt noting improvement in dizziness once long sitting in bed with head slightly reclined. Pt instructed in visual compensatory strategies to improve pt's access to environment, particularly table top items for meals, grooming, and IADL such as reading tasks (e.g., read nurse's name on white board, read off menu items for meals, etc). Pt demo'd difficulty with identifying letter, often confusing similarly shaped letters but with cues for visual rest breaks and using fingers to help pt visually scan L to R, pt able to correctly identify "MENU" and "STROKE" with several attempts. Pt reports that sometimes he is unable to see something he is looking at but with brief visual rest breaks (with initial instruction) pt reports he sometimes is then able to see what he missed. Pt continues to demonstrate excellent motivation and good progress towards goals. Continues to be great candidate for CIR upon discharge to support pt's return to modified independent level or better in order to minimize falls, functional decline, and increased caregiver burden.   Follow Up Recommendations  CIR    Equipment Recommendations  None recommended by OT    Recommendations for Other Services       Precautions / Restrictions Precautions Precautions: Fall Restrictions Weight Bearing Restrictions: No       Mobility Bed Mobility Overal bed mobility: Needs Assistance Bed Mobility: Sit to Supine              Transfers                      Balance Overall balance assessment: Needs assistance Sitting-balance support: Feet supported Sitting balance-Leahy Scale: Fair Sitting balance - Comments: pt leaning heavily to L side upon entry into room, with bed adjustments, pt sitting EOB level and able to maintain upright posture without LOB or lean                                   ADL either performed or assessed with clinical judgement   ADL Overall ADL's : Needs assistance/impaired Eating/Feeding: Sitting;Set up;Minimal assistance Eating/Feeding Details (indicate cue type and reason): Pt seated EOB finishing breakfast this morning. Significant spillage noted, unable to open OJ container requiring assist along with preparing coffee with very small creamer and sugar packets being difficult for him.Pt noted with increased spillage on L side of tray with food on plate (which was on R side of tray) being primarily what had been eaten. Pt did not touch items on far left side of tray.                                         Vision Baseline  Vision/History: No visual deficits Patient Visual Report: Blurring of vision Vision Assessment?: Yes;Vision impaired- to be further tested in functional context Additional Comments: inconsistent still, but increased difficulty with L side but some impairment of R side too, per MD, chronic R HH and likely new [facility]   Perception     Praxis      Cognition Arousal/Alertness: Awake/alert Behavior During Therapy: WFL for tasks assessed/performed                                   General Comments: slightly increased processing time and cues to initiate tasks, slight difficulty with problem  solving requiring cues        Exercises Other Exercises Other Exercises: Pt instructed in visual compensatory strategies to improve pt's access to environment, particularly table top items for meals, grooming, and IADL such as reading tasks (e.g., read nurse's name on white board, read off menu items for meals, etc). Pt demo'd difficulty with identifying letter, often confusing similarly shaped letters but with cues for visual rest breaks and using fingers to help pt visually scan L to R, pt able to correctly identify "MENU" and "STROKE" with several attempts   Shoulder Instructions       General Comments      Pertinent Vitals/ Pain       Pain Assessment: 0-10 Pain Score: 8  Pain Location: headache Pain Descriptors / Indicators: Headache Pain Intervention(s): Limited activity within patient's tolerance;Monitored during session;Premedicated before session;Repositioned  Home Living                                          Prior Functioning/Environment              Frequency  Min 3X/week        Progress Toward Goals  OT Goals(current goals can now be found in the care plan section)  Progress towards OT goals: Progressing toward goals  Acute Rehab OT Goals Patient Stated Goal: to see better and get back to driving OT Goal Formulation: With patient/family Time For Goal Achievement: 09/27/19 Potential to Achieve Goals: Good  Plan Discharge plan remains appropriate;Frequency remains appropriate    Co-evaluation                 AM-PAC OT "6 Clicks" Daily Activity     Outcome Measure   Help from another person eating meals?: None Help from another person taking care of personal grooming?: None Help from another person toileting, which includes using toliet, bedpan, or urinal?: A Little Help from another person bathing (including washing, rinsing, drying)?: A Little Help from another person to put on and taking off regular upper body clothing?:  None Help from another person to put on and taking off regular lower body clothing?: A Little 6 Click Score: 21    End of Session    OT Visit Diagnosis: Other abnormalities of gait and mobility (R26.89);Low vision, both eyes (H54.2)   Activity Tolerance Patient tolerated treatment well   Patient Left in bed;with call bell/phone within reach;with bed alarm set   Nurse Communication          Time: [id number] OT Time Calculation (min): 30 min  Charges: OT General Charges $OT Visit: 1 Visit OT Treatments $Neuromuscular Re-education: 23-37 mins  Jeni Salles, MPH, MS, OTR/L ascom [phone] 09/14/19,  10:18 AM

## 2019-09-14 NOTE — Progress Notes (Addendum)
Patient Name: [name] Date of Encounter: 09/14/2019  Hospital Problem List     Active Problems:   Accelerated hypertension   Essential hypertension   Dizziness   CVA (cerebral vascular accident) ([place])   Vision loss, bilateral   Tobacco abuse   Bradycardia   Non-sustained ventricular tachycardia [facility])    Patient Profile     69 y.o. male with history of 69 year old male with history of chronic atrial fibrillation anticoagulated with Eliquis which he states he is compliant with twice daily, diabetes mellitus, hypertension, use of beta-blockers as an outpatient who presented to the emergency room with complaints of dizziness and lightheadedness.  In the emergency room he was relatively hypokalemic with a potassium of 3.2 and a creatinine of 0.93.  His PT and APTT were normal.  EKG revealed atrial fibrillation with slow ventricular response with a ventricular rate in the mid 50s.  He was hemodynamically stable.  On telemetry he developed transient bradycardia and a 2.5-second pause while sleeping.  This a.m. patient continues to complain of dizziness and visual changes. Echo done yesterday showed EF of 40-45% similar to previous echo.   Subjective   Still with visual changes.   Inpatient Medications    . allopurinol  300 mg Oral Daily  . amLODipine  10 mg Oral Daily  . apixaban  5 mg Oral BID  . atorvastatin  80 mg Oral QPM  . colchicine  0.6 mg Oral Daily  . furosemide  20 mg Oral Daily  . gabapentin  400 mg Oral TID  . hydrALAZINE  25 mg Oral Q8H  . insulin aspart  0-5 Units Subcutaneous QHS  . insulin aspart  0-9 Units Subcutaneous TID WC  . lidocaine  1 patch Transdermal Q12H  . losartan  100 mg Oral Daily  . nicotine  7 mg Transdermal Daily  . potassium chloride  40 mEq Oral BID    Vital Signs    Vitals:   09/13/19 2348 09/14/19 0054 09/14/19 0410 09/14/19 0739  BP: (!) 170/116 (!) 158/101 (!) 165/106 (!) 163/99  Pulse: 68 69 71 85  Resp: 20  20 18    Temp: 98.3 F (36.8 C)  98.4 F (36.9 C) 97.6 F (36.4 C)  TempSrc: Oral   Oral  SpO2: 97%  99% 98%  Weight:      Height:        Intake/Output Summary (Last 24 hours) at 09/14/2019 0755 Last data filed at 09/13/2019 1859 Gross per 24 hour  Intake 240 ml  Output 2775 ml  Net -2535 ml   Filed Weights   09/12/19 1359  Weight: 97.1 kg    Physical Exam    GEN: Well nourished, well developed, in no acute distress.  HEENT: normal.  Neck: Supple, no JVD, carotid bruits, or masses. Cardiac: Irregular regular rhythm Respiratory:  Respirations regular and unlabored, clear to auscultation bilaterally. GI: Soft, nontender, nondistended, BS + x 4. MS: no deformity or atrophy. Skin: warm and dry, no rash. Neuro: Mild lower extremity weakness. Labs    CBC Recent Labs    09/12/19 1420  WBC 3.0*  NEUTROABS 1.6*  HGB 11.7*  HCT 37.9*  MCV 81.5  PLT 333   Basic Metabolic Panel Recent Labs    09/13/19 0524 09/14/19 0627  NA 139 136  K 3.2* 3.9  CL 107 101  CO2 27 27  GLUCOSE 95 102*  BUN 15 13  CREATININE 0.95 0.97  CALCIUM 8.6* 9.0  MG  --  1.9   Liver Function Tests Recent Labs    09/12/19 1420  AST 24  ALT 17  ALKPHOS 72  BILITOT 0.7  PROT 6.6  ALBUMIN 3.6   No results for input(s): LIPASE, AMYLASE in the last 72 hours. Cardiac Enzymes No results for input(s): CKTOTAL, CKMB, CKMBINDEX, TROPONINI in the last 72 hours. BNP No results for input(s): BNP in the last 72 hours. D-Dimer No results for input(s): DDIMER in the last 72 hours. Hemoglobin A1C Recent Labs    09/12/19 1420  HGBA1C 6.2*   Fasting Lipid Panel Recent Labs    09/13/19 0524  CHOL 132  HDL 50  LDLCALC 72  TRIG 48  CHOLHDL 2.6   Thyroid Function Tests No results for input(s): TSH, T4TOTAL, T3FREE, THYROIDAB in the last 72 hours.  Invalid input(s): FREET3  Telemetry    Atrial fibrillation with variable ventricular response.  No pauses longer than 3 seconds.  Brief episodes  of wide-complex tachycardia consistent with probable aberrant atrial fibrillation.  Nonsustained VT possible  ECG    Atrial fibrillation with slow ventricular response  Radiology    CT ANGIO HEAD W OR WO CONTRAST  Result Date: 09/12/2019 CLINICAL DATA:  69 year old male code stroke presentation this afternoon. History of chronic cerebral infarcts with age indeterminate right cerebellar infarct on plain CT. EXAM: CT ANGIOGRAPHY HEAD AND NECK TECHNIQUE: Multidetector CT imaging of the head and neck was performed using the standard protocol during bolus administration of intravenous contrast. Multiplanar CT image reconstructions and MIPs were obtained to evaluate the vascular anatomy. Carotid stenosis measurements (when applicable) are obtained utilizing NASCET criteria, using the distal internal carotid diameter as the denominator. CONTRAST:  89mL OMNIPAQUE IOHEXOL 350 MG/ML SOLN COMPARISON:  Plain head CT 1409 hours today. Carotid Doppler ultrasound 10/03/2016. Head MRA 10/04/2016. FINDINGS: CTA NECK Skeleton: Diffuse idiopathic skeletal hyperostosis (DISH). Bulky and flowing osteophytes throughout the visible spine. Absent maxillary dentition. No acute osseous abnormality identified. Upper chest: Mild upper lung scarring. Upper limits of normal superior mediastinal lymph nodes. Other neck: 2 cm anterior right thyroid lobe hypodense nodule which meets size criteria for ultrasound follow-up (ref: J Am Coll Radiol. 2015 Feb;12(2): 143-50).The glottis is closed. Partial effaced pharynx also. Negative for neck mass or lymphadenopathy. Aortic arch: 3 vessel arch configuration with mild arch atherosclerosis. Right carotid system: Mild brachiocephalic atherosclerosis without stenosis. Negative right CCA origin. Mild calcified plaque proximal to the bifurcation. Moderate mixed soft and calcified plaque at the right ICA origin and bulb, but less than 50 % stenosis with respect to the distal vessel. Tortuous right  ICA just below the skull. Left carotid system: Soft and calcified plaque at the left CCA origin with less than 50 % stenosis with respect to the distal vessel. Occasional mild calcified plaque proximal to the bifurcation. Bulky calcified plaque at the left ICA origin and bulb with up to 50 % stenosis with respect to the distal vessel. Tortuous left ICA just below the skull. Vertebral arteries: Proximal right subclavian artery plaque without stenosis. The right vertebral artery origin is difficult to identify with suspected at least moderate stenosis on series 7, image 179. The right vertebral is somewhat diminutive but otherwise patent to the skull base without stenosis. Mild proximal left subclavian artery plaque without stenosis. Dominant left vertebral artery. Similar to the right side evidence of moderate to severe left vertebral artery origin stenosis due to atherosclerosis on series 8, image 160. Some of the V1 segment is obscured by dense paravertebral venous  contrast. But the left vertebral artery remains dominant and patent to the skull base without additional stenosis. CTA HEAD Posterior circulation: Mild left V4 calcified plaque without stenosis. Non dominant right vertebral artery functionally terminates in PICA. Both PICA vessels appear to be patent. Tortuous left vertebrobasilar junction and basilar artery. No basilar stenosis. Patent SCA and PCA origins. Posterior communicating arteries are diminutive or absent. Bilateral PCA branches are within normal limits, the left mildly asymmetrically smaller in the setting of chronic left distal PCA infarct. Anterior circulation: Both ICA siphons are patent and tortuous. Bilateral siphon calcified plaque does not result in significant stenosis. Patent carotid termini, MCA and ACA origins. Codominant A1 segments. Anterior communicating artery and bilateral ACA branches are within normal limits. Left MCA M1 segment and bifurcation are patent without stenosis.  Left MCA branches are within normal limits. Right MCA M1 segment and bifurcation are patent without stenosis. Right MCA branches are within normal limits. Venous sinuses: Patent. Anatomic variants: Dominant left vertebral artery which primarily supplies the basilar. Review of the MIP images confirms the above findings IMPRESSION: 1. Negative for large vessel occlusion. 2. 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 The left vertebral artery is dominant and primarily supplies the basilar without additional stenosis. Both PICA vessels appear to remain patent. 3. Bilateral cervical and intracranial carotid atherosclerosis. Up to 50% stenosis at the left ICA origin, no hemodynamically significant stenosis elsewhere. 4. A 2 cm right thyroid lobe nodule which meets size criteria for Thyroid Ultrasound follow-up. 5. Diffuse idiopathic skeletal hyperostosis (DISH). Electronically Signed   By: Genevie Ann M.D.   On: 09/12/2019 14:59   CT HEAD WO CONTRAST  Result Date: 09/13/2019 CLINICAL DATA:  Vision loss. EXAM: CT HEAD WITHOUT CONTRAST TECHNIQUE: Contiguous axial images were obtained from the base of the skull through the vertex without intravenous contrast. COMPARISON:  September 12, 2019. FINDINGS: Brain: Right occipital low density is noted most consistent with acute infarction. Ventricular size is within normal limits. No midline shift is noted. Old left frontal and occipital infarctions are noted. No definite hemorrhage is noted. Vascular: No hyperdense vessel or unexpected calcification. Skull: Normal. Negative for fracture or focal lesion. Sinuses/Orbits: No acute finding. Other: None. IMPRESSION: Right occipital low density is noted most consistent with acute infarction as described on prior MRI. Old left frontal and occipital infarctions are noted. No definite hemorrhage is noted. Electronically Signed   By: Marijo Conception M.D.   On: 09/13/2019 14:14   CT ANGIO NECK W OR WO  CONTRAST  Result Date: 09/12/2019 CLINICAL DATA:  69 year old male code stroke presentation this afternoon. History of chronic cerebral infarcts with age indeterminate right cerebellar infarct on plain CT. EXAM: CT ANGIOGRAPHY HEAD AND NECK TECHNIQUE: Multidetector CT imaging of the head and neck was performed using the standard protocol during bolus administration of intravenous contrast. Multiplanar CT image reconstructions and MIPs were obtained to evaluate the vascular anatomy. Carotid stenosis measurements (when applicable) are obtained utilizing NASCET criteria, using the distal internal carotid diameter as the denominator. CONTRAST:  54mL OMNIPAQUE IOHEXOL 350 MG/ML SOLN COMPARISON:  Plain head CT 1409 hours today. Carotid Doppler ultrasound 10/03/2016. Head MRA 10/04/2016. FINDINGS: CTA NECK Skeleton: Diffuse idiopathic skeletal hyperostosis (DISH). Bulky and flowing osteophytes throughout the visible spine. Absent maxillary dentition. No acute osseous abnormality identified. Upper chest: Mild upper lung scarring. Upper limits of normal superior mediastinal lymph nodes. Other neck: 2 cm anterior right thyroid lobe hypodense nodule which meets size criteria  for ultrasound follow-up (ref: J Am Coll Radiol. 2015 Feb;12(2): 143-50).The glottis is closed. Partial effaced pharynx also. Negative for neck mass or lymphadenopathy. Aortic arch: 3 vessel arch configuration with mild arch atherosclerosis. Right carotid system: Mild brachiocephalic atherosclerosis without stenosis. Negative right CCA origin. Mild calcified plaque proximal to the bifurcation. Moderate mixed soft and calcified plaque at the right ICA origin and bulb, but less than 50 % stenosis with respect to the distal vessel. Tortuous right ICA just below the skull. Left carotid system: Soft and calcified plaque at the left CCA origin with less than 50 % stenosis with respect to the distal vessel. Occasional mild calcified plaque proximal to the  bifurcation. Bulky calcified plaque at the left ICA origin and bulb with up to 50 % stenosis with respect to the distal vessel. Tortuous left ICA just below the skull. Vertebral arteries: Proximal right subclavian artery plaque without stenosis. The right vertebral artery origin is difficult to identify with suspected at least moderate stenosis on series 7, image 179. The right vertebral is somewhat diminutive but otherwise patent to the skull base without stenosis. Mild proximal left subclavian artery plaque without stenosis. Dominant left vertebral artery. Similar to the right side evidence of moderate to severe left vertebral artery origin stenosis due to atherosclerosis on series 8, image 160. Some of the V1 segment is obscured by dense paravertebral venous contrast. But the left vertebral artery remains dominant and patent to the skull base without additional stenosis. CTA HEAD Posterior circulation: Mild left V4 calcified plaque without stenosis. Non dominant right vertebral artery functionally terminates in PICA. Both PICA vessels appear to be patent. Tortuous left vertebrobasilar junction and basilar artery. No basilar stenosis. Patent SCA and PCA origins. Posterior communicating arteries are diminutive or absent. Bilateral PCA branches are within normal limits, the left mildly asymmetrically smaller in the setting of chronic left distal PCA infarct. Anterior circulation: Both ICA siphons are patent and tortuous. Bilateral siphon calcified plaque does not result in significant stenosis. Patent carotid termini, MCA and ACA origins. Codominant A1 segments. Anterior communicating artery and bilateral ACA branches are within normal limits. Left MCA M1 segment and bifurcation are patent without stenosis. Left MCA branches are within normal limits. Right MCA M1 segment and bifurcation are patent without stenosis. Right MCA branches are within normal limits. Venous sinuses: Patent. Anatomic variants: Dominant left  vertebral artery which primarily supplies the basilar. Review of the MIP images confirms the above findings IMPRESSION: 1. Negative for large vessel occlusion. 2. 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 The left vertebral artery is dominant and primarily supplies the basilar without additional stenosis. Both PICA vessels appear to remain patent. 3. Bilateral cervical and intracranial carotid atherosclerosis. Up to 50% stenosis at the left ICA origin, no hemodynamically significant stenosis elsewhere. 4. A 2 cm right thyroid lobe nodule which meets size criteria for Thyroid Ultrasound follow-up. 5. Diffuse idiopathic skeletal hyperostosis (DISH). Electronically Signed   By: Genevie Ann M.D.   On: 09/12/2019 14:59   MR BRAIN WO CONTRAST  Result Date: 09/12/2019 CLINICAL DATA:  Ataxia, stroke suspected. EXAM: MRI HEAD WITHOUT CONTRAST TECHNIQUE: Multiplanar, multiecho pulse sequences of the brain and surrounding structures were obtained without intravenous contrast. COMPARISON:  Noncontrast CT head and CT angiogram head/neck performed earlier the same day 09/12/2019 FINDINGS: Brain: There is cortical/subcortical restricted diffusion consistent with acute infarction within the right PCA vascular territory involving the posteromedial right temporal lobe and right occipital lobe. This encompasses a  region of approximately 6 cm. Patchy corresponding T2/FLAIR hyperintensity at this site. There is no significant mass effect at this time. Redemonstrated chronic cortically based infarct within the left MCA vascular territory within the left frontoparietal lobes. Mild chronic hemosiderin deposition at this site. Redemonstrated chronic left PCA territory cortically based occipital lobe infarct. Chronic lacunar infarct within the superior right cerebellar hemisphere. Mild ill-defined T2/FLAIR hyperintensity within the pons consistent with chronic small ischemic changes. Mild generalized  parenchymal atrophy. Incidentally noted cavum septum pellucidum and cavum vergae. No evidence of intracranial mass. No chronic intracranial blood products. No extra-axial fluid collection. No midline shift. Vascular: Expected proximal arterial flow voids. Skull and upper cervical spine: No focal marrow lesion. Sinuses/Orbits: Visualized orbits show no acute finding. Mild ethmoid and maxillary sinus mucosal thickening. No significant mastoid effusion IMPRESSION: 1. Acute cortical/subcortical infarct within the posteromedial right temporal lobe and right occipital lobe within the right PCA vascular territory. 2. Redemonstrated chronic infarcts within the left frontoparietal lobes, left occipital lobe and superior right cerebellum. 3. Mild chronic small vessel ischemic changes within the pons. 4. Mild generalized parenchymal atrophy. 5. Mild paranasal sinus mucosal thickening. Electronically Signed   By: Kellie Simmering DO   On: 09/12/2019 18:19   ECHOCARDIOGRAM COMPLETE  Result Date: 09/13/2019    ECHOCARDIOGRAM REPORT   Patient Name:   IRBY Bertino Date of Exam: 09/13/2019 Medical Rec #:  [id number]          Height:       74.0 in Accession #:    [id number]         Weight:       214.0 lb Date of Birth:  06/27/1950           BSA:          2.237 m Patient Age:    80 years           BP:           168/92 mmHg Patient Gender: M                  HR:           66 bpm. Exam Location:  [facility] Procedure: 2D Echo, Cardiac Doppler and Color Doppler Indications:     Stroke 434.91  History:         Patient has prior history of Echocardiogram examinations, most                  recent 04/08/2019. Stroke, Arrythmias:Atrial Fibrillation; Risk                  Factors:Hypertension. Pulmonary embolism.  Sonographer:     Sherrie Sport RDCS (AE) Referring Phys:  [id number] Loletha [name] Diagnosing Phys: Bartholome Bill MD IMPRESSIONS  1. Left ventricular ejection fraction, by estimation, is 40 to 45%. The left ventricle has mildly decreased  function. The left ventricle has no regional wall motion abnormalities. The left ventricular internal cavity size was mildly dilated. There is mild left ventricular hypertrophy. Left ventricular diastolic parameters are consistent with Grade I diastolic dysfunction (impaired relaxation).  2. Right ventricular systolic function is normal. The right ventricular size is mildly enlarged. There is normal pulmonary artery systolic pressure.  3. Left atrial size was mildly dilated.  4. Right atrial size was mildly dilated.  5. The mitral valve is grossly normal. Mild mitral valve regurgitation.  6. The aortic valve is grossly normal. Aortic valve regurgitation is mild. FINDINGS  Left Ventricle: Left ventricular  ejection fraction, by estimation, is 40 to 45%. The left ventricle has mildly decreased function. The left ventricle has no regional wall motion abnormalities. The left ventricular internal cavity size was mildly dilated. There is mild left ventricular hypertrophy. Left ventricular diastolic parameters are consistent with Grade I diastolic dysfunction (impaired relaxation). Right Ventricle: The right ventricular size is mildly enlarged. No increase in right ventricular wall thickness. Right ventricular systolic function is normal. There is normal pulmonary artery systolic pressure. The tricuspid regurgitant velocity is 2.16  m/s, and with an assumed right atrial pressure of 10 mmHg, the estimated right ventricular systolic pressure is 21.2 mmHg. Left Atrium: Left atrial size was mildly dilated. Right Atrium: Right atrial size was mildly dilated. Pericardium: There is no evidence of pericardial effusion. Mitral Valve: The mitral valve is grossly normal. Mild mitral valve regurgitation. Tricuspid Valve: The tricuspid valve is grossly normal. Tricuspid valve regurgitation is mild. Aortic Valve: The aortic valve is grossly normal. Aortic valve regurgitation is mild. Aortic valve mean gradient measures 2.5 mmHg. Aortic  valve peak gradient measures 4.2 mmHg. Aortic valve area, by VTI measures 2.79 cm. Pulmonic Valve: The pulmonic valve was not well visualized. Pulmonic valve regurgitation is trivial. Aorta: The aortic root is normal in size and structure. IAS/Shunts: The interatrial septum was not assessed.  LEFT VENTRICLE PLAX 2D LVIDd:         5.01 cm LVIDs:         4.11 cm LV PW:         1.38 cm LV IVS:        1.15 cm LVOT diam:     2.20 cm LV SV:         51 LV SV Index:   23 LVOT Area:     3.80 cm  RIGHT VENTRICLE RV Basal diam:  3.53 cm RV S prime:     9.57 cm/s TAPSE (M-mode): 4.3 cm LEFT ATRIUM              Index       RIGHT ATRIUM           Index LA diam:        4.70 cm  2.10 cm/m  RA Area:     39.70 cm LA Vol (A2C):   100.0 ml 44.71 ml/m RA Volume:   157.00 ml 70.19 ml/m LA Vol (A4C):   163.0 ml 72.87 ml/m LA Biplane Vol: 137.0 ml 61.25 ml/m  AORTIC VALVE                   PULMONIC VALVE AV Area (Vmax):    2.07 cm    RVOT Peak grad: 1 mmHg AV Area (Vmean):   2.01 cm AV Area (VTI):     2.79 cm AV Vmax:           103.00 cm/s AV Vmean:          69.550 cm/s AV VTI:            0.181 m AV Peak Grad:      4.2 mmHg AV Mean Grad:      2.5 mmHg LVOT Vmax:         56.10 cm/s LVOT Vmean:        36.800 cm/s LVOT VTI:          0.133 m LVOT/AV VTI ratio: 0.73  AORTA Ao Root diam: 3.10 cm MITRAL VALVE  TRICUSPID VALVE MV Area (PHT): 4.74 cm     TR Peak grad:   18.7 mmHg MV Decel Time: 160 msec     TR Vmax:        216.00 cm/s MV E velocity: 123.00 cm/s                             SHUNTS                             Systemic VTI:  0.13 m                             Systemic Diam: 2.20 cm Bartholome Bill MD Electronically signed by Bartholome Bill MD Signature Date/Time: 09/13/2019/11:29:55 AM    Final    CT HEAD CODE STROKE WO CONTRAST  Result Date: 09/12/2019 CLINICAL DATA:  Code stroke. EXAM: CT HEAD WITHOUT CONTRAST TECHNIQUE: Contiguous axial images were obtained from the base of the skull through the vertex  without intravenous contrast. COMPARISON:  2019 FINDINGS: Brain: There is no acute intracranial hemorrhage, mass effect, or edema. There is no new loss of gray-white differentiation. Chronic left MCA territory infarction involving the frontoparietal lobes and insula again identified. Additional chronic left occipital infarct is again seen. There is a new small infarct of the superior right cerebellum. Otherwise, patchy hypoattenuation in the supratentorial white matter is nonspecific but may reflect stable chronic microvascular ischemic changes. The ventricles and sulci are within normal limits in size and configuration apart from volume loss associated with infarcts. Vascular: There is no hyperdense vessel. Mild intracranial atherosclerotic calcification is present at the skull base. Skull: Unremarkable. Sinuses/Orbits: No acute abnormality. Relative prominence of the left superior ophthalmic vein may reflect a varix. Other: Mastoid air cells are clear. ASPECTS ([place] Stroke Program Early CT Score) - Ganglionic level infarction (caudate, lentiform nuclei, internal capsule, insula, M1-M3 cortex): 7 - Supraganglionic infarction (M4-M6 cortex): 3 Total score (0-10 with 10 being normal): 10, accounting for chronic infarct IMPRESSION: No acute intracranial hemorrhage or evidence of acute infarction. ASPECT score is 10 accounting for chronic infarct. New small infarct of the right cerebellum is favored to be chronic but age indeterminate. These results were called by telephone at the time of interpretation on 09/12/2019 at 2:17 pm to provider Lavonia Drafts , who verbally acknowledged these results. Electronically Signed   By: Macy Mis M.D.   On: 09/12/2019 14:25    Assessment & Plan    69 year old male with history of chronic atrial fibrillation treated with anticoagulation and rate control with metoprolol, history of COPD, history of mildly reduced LV function by echocardiogram.  EF approximately 40% which  is not change from previous echo.  Presented with dizziness and visual changes noted to have a right acute posterior medial temporal lobe and right occipital lobe CVA in the right posterior cerebral artery vascular territory.  1.  Atrial fibrillation-states he is compliant with Eliquis.  His PT and PTT are normal.  While these values do not allow following of the levels, typically PTT is somewhat prolonged when patient is compliant with Eliquis.  Restressed the importance of compliance with this.  Will need to continue with this unless contraindicated from a neurologic standpoint.  Would agree with holding metoprolol due to relative bradycardia.  No sustained tachyarrhythmias.  Occasional wide-complex nonsustained rhythm.  Aberrant A. fib  conduction versus ventricular run.  Patient ventricle is 40 to 45%. Patient did have pauses at night however this may suggest sleep apnea.  As mentioned above would defer using beta-blockers for now.  Will need a Holter monitor as an outpatient. Does not appear to require pacemaker therapy at this point.    2.  CVA-appreciate neurology's input.  We will continue with Eliquis and follow his rhythm.  3.  Cardiomyopathy-mild cardiomyopathy EF around 40%.  Current echo shows no appreciable change. No evidence of congestive heart failure clinically.  Would continue to follow.  Low-sodium diet.Negative 2.5 liters since admission.   4.  Diabetes mellitus-continue with medical management.  5.  Hypertension-continue with careful diuresis following hemodynamics and electrolytes.  Patient remains relatively hypotensive.  Will remain off of metoprolol and increase hydralazine to 50 mg 3 times daily and continue with amlodipine and losartan at current doses.  6.  Hyperlipidemia-continue with atorvastatin 80 mg daily.  LDL goal of 70.  This can be followed as an outpatient.  Signed, Javier Docker Chayne Baumgart MD 09/14/2019, 7:55 AM  Pager: [phone]

## 2019-09-14 NOTE — Progress Notes (Signed)
SLP Cancellation Note  Patient Details Name: Lawarence Meek MRN: [id number] DOB: 10-23-1950   Cancelled treatment:       Reason Eval/Treat Not Completed: (chart reviewed. ST services will f/u in AM w/ eval.)   Ahleah Simko 09/14/2019, 7:50 AM

## 2019-09-14 NOTE — Progress Notes (Signed)
Physical Therapy Treatment Patient Details Name: [name] MRN: [id number] DOB: [birth year] Today's Date: 09/14/2019    History of Present Illness Pt admitted for HTN with positive R temporal/occipital lobe CVA. PMH includes prior CVA, DVT, Afib, DM, and HTN. Pt with complaints of dizziness, HA, and speech difficulties.     PT Comments    Pt was long sitting in bed upon arriving. He agrees to PT session and is motivated and pleasant throughout. He reports no pain but did at one point c/o of having slight head ache. BP at rest 138/93 with HR 72 and sao2 99%. He was able to exit L side of bed with increased time and vcs for improved technique. BP in sitting 141/90 with HR slightly elevating to 89 bpm.  CGA for safety with bed mobility + minimal ataxia noted. Pt does have dysarthria and requires slightly increased time to process desired task/ relaying his thoughts. He stood from EOB to [facility] with min assist + moderate vcs for improved technique and sequencing. Pt stood 3 x EOB prior to ambulation 25 ft with SPC. Unsteadiness noted during gait and pt fatigues quickly. HR elevated to 116 with ambulation and pt required prolonged rest in sitting afterwards. He is great CIR candidate and will benefit from continued skilled PT to address strength, balance, and overall safe functional mobility. Pt was completely independent prior to admission. Acute PT will continue to follow per POC and progress pt as able per pt tolerance. He is seated up in recliner post session with call bell in reach and chair alarm in place.     Follow Up Recommendations  CIR     Equipment Recommendations  None recommended by PT    Recommendations for Other Services Rehab consult     Precautions / Restrictions Precautions Precautions: Fall Restrictions Weight Bearing Restrictions: No    Mobility  Bed Mobility Overal bed mobility: Needs Assistance Bed Mobility: Supine to Sit;Sit to Supine     Supine to sit: Min  guard     General bed mobility comments: Pt required CGA assist for safety with vcs for technique and sequnencing improvements. HOB elevated. pt with slightly axatic movements. Sat EOB without LOB with BLes/UE support.  Transfers Overall transfer level: Needs assistance Equipment used: Straight cane Transfers: Sit to/from Stand Sit to Stand: Min assist         General transfer comment: pt required min assist to stand from slightly elevated bed height with vcs for proper placement of SPC and improved fwd wt shift/technique. Min assist to achieve full upright standing.  Ambulation/Gait Ambulation/Gait assistance: Min assist   Assistive device: Straight cane Gait Pattern/deviations: Staggering right;Staggering left;Trunk flexed;Wide base of support Gait velocity: decreased   General Gait Details: Pt was able to ambulate 25 ft in room with SPC + min assist. pt has slow cadence and unsteadiness throughout. Unable to ambulate further distances 2/2 to fatigue.     Stairs             Wheelchair Mobility    Modified Rankin (Stroke Patients Only)       Balance Overall balance assessment: Needs assistance Sitting-balance support: Feet supported Sitting balance-Leahy Scale: Good Sitting balance - Comments: no LOB in sitting   Standing balance support: Single extremity supported Standing balance-Leahy Scale: Fair Standing balance comment: pt slightly unsteady with dynamic balance/gait activities  Cognition Arousal/Alertness: Awake/alert Behavior During Therapy: WFL for tasks assessed/performed Overall Cognitive Status: Within Functional Limits for tasks assessed                                 General Comments: pt is A and O but does require incraesed time to process/dysarthria      Exercises      General Comments        Pertinent Vitals/Pain      Home Living                      Prior Function             PT Goals (current goals can now be found in the care plan section) Acute Rehab PT Goals Patient Stated Goal: " I want to return to PLOF" Progress towards PT goals: Progressing toward goals    Frequency    7X/week      PT Plan Current plan remains appropriate    Co-evaluation              AM-PAC PT "6 Clicks" Mobility   Outcome Measure  Help needed turning from your back to your side while in a flat bed without using bedrails?: None Help needed moving from lying on your back to sitting on the side of a flat bed without using bedrails?: A Little Help needed moving to and from a bed to a chair (including a wheelchair)?: A Little Help needed standing up from a chair using your arms (e.g., wheelchair or bedside chair)?: A Little Help needed to walk in hospital room?: A Little Help needed climbing 3-5 steps with a railing? : A Lot 6 Click Score: 18    End of Session Equipment Utilized During Treatment: Gait belt Activity Tolerance: Patient tolerated treatment well Patient left: in chair;with chair alarm set Nurse Communication: Mobility status PT Visit Diagnosis: Muscle weakness (generalized) (M62.81);Difficulty in walking, not elsewhere classified (R26.2);Unsteadiness on feet (R26.81)     Time: 1015-1040 PT Time Calculation (min) (ACUTE ONLY): 25 min  Charges:  $Gait Training: 8-22 mins $Neuromuscular Re-education: 8-22 mins                     Julaine Fusi PTA 09/14/19, 2:25 PM

## 2019-09-14 NOTE — Progress Notes (Addendum)
Patient ID: [name], male   DOB: 1950/07/07, 69 y.o.   MRN: [id number] [facility] PROGRESS NOTE  Wasyl Dornfeld FTD:[id number] DOB: 04/19/1951 DOA: 09/12/2019 PCP: Theotis Burrow, MD  HPI/Subjective: Patient felt unsteady with walking and still has blurred vision.  Otherwise feels okay.  Objective: Vitals:   09/14/19 1223 09/14/19 1507  BP: (!) 146/68 (!) 154/94  Pulse: 78 69  Resp: 16   Temp: 99.7 F (37.6 C)   SpO2: 100%     Intake/Output Summary (Last 24 hours) at 09/14/2019 1611 Last data filed at 09/14/2019 1017 Gross per 24 hour  Intake 600 ml  Output 1350 ml  Net -750 ml   Filed Weights   09/12/19 1359  Weight: 97.1 kg    ROS: Review of Systems  Constitutional: Negative for fever.  Eyes: Positive for blurred vision. Negative for pain.  Respiratory: Negative for cough and shortness of breath.   Cardiovascular: Negative for chest pain.  Gastrointestinal: Negative for abdominal pain, nausea and vomiting.  Genitourinary: Negative for dysuria.  Musculoskeletal: Negative for joint pain.  Neurological: Negative for dizziness.   Exam: Physical Exam  HENT:  Nose: No mucosal edema.  Mouth/Throat: No oropharyngeal exudate or posterior oropharyngeal edema.  Eyes: Pupils are equal, round, and reactive to light. Conjunctivae and lids are normal.  Right eye slightly deviated out  Neck: Carotid bruit is not present.  Cardiovascular: S1 normal and S2 normal. An irregularly irregular rhythm present. Exam reveals no gallop.  No murmur heard. Respiratory: No respiratory distress. He has no wheezes. He has no rhonchi. He has no rales.  GI: Soft. Bowel sounds are normal. There is no abdominal tenderness.  Musculoskeletal:     Right ankle: Swelling present.     Left ankle: Swelling present.  Lymphadenopathy:    He has no cervical adenopathy.  Neurological: He is alert.  Poor vision, each eye tested separately and both eyes together.  Can hardly see  my fingers in front of his face.  Power 5 out of 5 bilateral upper and lower extremity  Skin: Skin is warm. No rash noted. Nails show no clubbing.  Psychiatric: He has a normal mood and affect.      Data Reviewed: Basic Metabolic Panel: Recent Labs  Lab 09/12/19 1420 09/13/19 0524 09/14/19 0627  NA 142 139 136  K 3.2* 3.2* 3.9  CL 106 107 101  CO2 28 27 27   GLUCOSE 155* 95 102*  BUN 16 15 13   CREATININE 0.93 0.95 0.97  CALCIUM 8.8* 8.6* 9.0  MG  --   --  1.9   Liver Function Tests: Recent Labs  Lab 09/12/19 1420  AST 24  ALT 17  ALKPHOS 72  BILITOT 0.7  PROT 6.6  ALBUMIN 3.6   CBC: Recent Labs  Lab 09/12/19 1420  WBC 3.0*  NEUTROABS 1.6*  HGB 11.7*  HCT 37.9*  MCV 81.5  PLT 152   BNP (last 3 results) Recent Labs    04/08/19 0848  BNP 298.0*     CBG: Recent Labs  Lab 09/13/19 1132 09/13/19 1630 09/13/19 2120 09/14/19 0731 09/14/19 1148  GLUCAP 85 93 86 101* 121*    Recent Results (from the past 240 hour(s))  Respiratory Panel by RT PCR (Flu A&B, Covid) - Nasopharyngeal Swab     Status: None   Collection Time: 09/12/19  3:20 PM   Specimen: Nasopharyngeal Swab  Result Value Ref Range Status   SARS Coronavirus 2 by RT PCR NEGATIVE NEGATIVE Final  Comment: (NOTE) SARS-CoV-2 target nucleic acids are NOT DETECTED. The SARS-CoV-2 RNA is generally detectable in upper respiratoy specimens during the acute phase of infection. The lowest concentration of SARS-CoV-2 viral copies this assay can detect is 131 copies/mL. A negative result does not preclude SARS-Cov-2 infection and should not be used as the sole basis for treatment or other patient management decisions. A negative result may occur with  improper specimen collection/handling, submission of specimen other than nasopharyngeal swab, presence of viral mutation(s) within the areas targeted by this assay, and inadequate number of viral copies (<131 copies/mL). A negative result must be  combined with clinical observations, patient history, and epidemiological information. The expected result is Negative. Fact Sheet for Patients:  PinkCheek.be Fact Sheet for Healthcare Providers:  GravelBags.it This test is not yet ap proved or cleared by the [place] FDA and  has been authorized for detection and/or diagnosis of SARS-CoV-2 by FDA under an Emergency Use Authorization (EUA). This EUA will remain  in effect (meaning this test can be used) for the duration of the COVID-19 declaration under Section 564(b)(1) of the Act, 21 U.S.C. section 360bbb-3(b)(1), unless the authorization is terminated or revoked sooner.    Influenza A by PCR NEGATIVE NEGATIVE Final   Influenza B by PCR NEGATIVE NEGATIVE Final    Comment: (NOTE) The Xpert Xpress SARS-CoV-2/FLU/RSV assay is intended as an aid in  the diagnosis of influenza from Nasopharyngeal swab specimens and  should not be used as a sole basis for treatment. Nasal washings and  aspirates are unacceptable for Xpert Xpress SARS-CoV-2/FLU/RSV  testing. Fact Sheet for Patients: PinkCheek.be Fact Sheet for Healthcare Providers: GravelBags.it This test is not yet approved or cleared by the [place] FDA and  has been authorized for detection and/or diagnosis of SARS-CoV-2 by  FDA under an Emergency Use Authorization (EUA). This EUA will remain  in effect (meaning this test can be used) for the duration of the  Covid-19 declaration under Section 564(b)(1) of the Act, 21  U.S.C. section 360bbb-3(b)(1), unless the authorization is  terminated or revoked. Performed at [facility], [address], [place], [place] [address]      Studies: CT HEAD WO CONTRAST  Result Date: 09/13/2019 CLINICAL DATA:  Vision loss. EXAM: CT HEAD WITHOUT CONTRAST TECHNIQUE: Contiguous axial images were obtained from the  base of the skull through the vertex without intravenous contrast. COMPARISON:  September 12, 2019. FINDINGS: Brain: Right occipital low density is noted most consistent with acute infarction. Ventricular size is within normal limits. No midline shift is noted. Old left frontal and occipital infarctions are noted. No definite hemorrhage is noted. Vascular: No hyperdense vessel or unexpected calcification. Skull: Normal. Negative for fracture or focal lesion. Sinuses/Orbits: No acute finding. Other: None. IMPRESSION: Right occipital low density is noted most consistent with acute infarction as described on prior MRI. Old left frontal and occipital infarctions are noted. No definite hemorrhage is noted. Electronically Signed   By: Marijo Conception M.D.   On: 09/13/2019 14:14   MR BRAIN WO CONTRAST  Result Date: 09/12/2019 CLINICAL DATA:  Ataxia, stroke suspected. EXAM: MRI HEAD WITHOUT CONTRAST TECHNIQUE: Multiplanar, multiecho pulse sequences of the brain and surrounding structures were obtained without intravenous contrast. COMPARISON:  Noncontrast CT head and CT angiogram head/neck performed earlier the same day 09/12/2019 FINDINGS: Brain: There is cortical/subcortical restricted diffusion consistent with acute infarction within the right PCA vascular territory involving the posteromedial right temporal lobe and right occipital lobe. This encompasses  a region of approximately 6 cm. Patchy corresponding T2/FLAIR hyperintensity at this site. There is no significant mass effect at this time. Redemonstrated chronic cortically based infarct within the left MCA vascular territory within the left frontoparietal lobes. Mild chronic hemosiderin deposition at this site. Redemonstrated chronic left PCA territory cortically based occipital lobe infarct. Chronic lacunar infarct within the superior right cerebellar hemisphere. Mild ill-defined T2/FLAIR hyperintensity within the pons consistent with chronic small ischemic changes.  Mild generalized parenchymal atrophy. Incidentally noted cavum septum pellucidum and cavum vergae. No evidence of intracranial mass. No chronic intracranial blood products. No extra-axial fluid collection. No midline shift. Vascular: Expected proximal arterial flow voids. Skull and upper cervical spine: No focal marrow lesion. Sinuses/Orbits: Visualized orbits show no acute finding. Mild ethmoid and maxillary sinus mucosal thickening. No significant mastoid effusion IMPRESSION: 1. Acute cortical/subcortical infarct within the posteromedial right temporal lobe and right occipital lobe within the right PCA vascular territory. 2. Redemonstrated chronic infarcts within the left frontoparietal lobes, left occipital lobe and superior right cerebellum. 3. Mild chronic small vessel ischemic changes within the pons. 4. Mild generalized parenchymal atrophy. 5. Mild paranasal sinus mucosal thickening. Electronically Signed   By: Kellie Simmering DO   On: 09/12/2019 18:19   ECHOCARDIOGRAM COMPLETE  Result Date: 09/13/2019    ECHOCARDIOGRAM REPORT   Patient Name:   HOLLIE Patman Date of Exam: 09/13/2019 Medical Rec #:  [id number]          Height:       74.0 in Accession #:    [id number]         Weight:       214.0 lb Date of Birth:  1950-10-29           BSA:          2.237 m Patient Age:    54 years           BP:           168/92 mmHg Patient Gender: M                  HR:           66 bpm. Exam Location:  [facility] Procedure: 2D Echo, Cardiac Doppler and Color Doppler Indications:     Stroke 434.91  History:         Patient has prior history of Echocardiogram examinations, most                  recent 04/08/2019. Stroke, Arrythmias:Atrial Fibrillation; Risk                  Factors:Hypertension. Pulmonary embolism.  Sonographer:     Sherrie Sport RDCS (AE) Referring Phys:  [id number] Loletha [name] Diagnosing Phys: Bartholome Bill MD IMPRESSIONS  1. Left ventricular ejection fraction, by estimation, is 40 to 45%. The left ventricle has  mildly decreased function. The left ventricle has no regional wall motion abnormalities. The left ventricular internal cavity size was mildly dilated. There is mild left ventricular hypertrophy. Left ventricular diastolic parameters are consistent with Grade I diastolic dysfunction (impaired relaxation).  2. Right ventricular systolic function is normal. The right ventricular size is mildly enlarged. There is normal pulmonary artery systolic pressure.  3. Left atrial size was mildly dilated.  4. Right atrial size was mildly dilated.  5. The mitral valve is grossly normal. Mild mitral valve regurgitation.  6. The aortic valve is grossly normal. Aortic valve regurgitation is mild. FINDINGS  Left Ventricle: Left  ventricular ejection fraction, by estimation, is 40 to 45%. The left ventricle has mildly decreased function. The left ventricle has no regional wall motion abnormalities. The left ventricular internal cavity size was mildly dilated. There is mild left ventricular hypertrophy. Left ventricular diastolic parameters are consistent with Grade I diastolic dysfunction (impaired relaxation). Right Ventricle: The right ventricular size is mildly enlarged. No increase in right ventricular wall thickness. Right ventricular systolic function is normal. There is normal pulmonary artery systolic pressure. The tricuspid regurgitant velocity is 2.16  m/s, and with an assumed right atrial pressure of 10 mmHg, the estimated right ventricular systolic pressure is 25.4 mmHg. Left Atrium: Left atrial size was mildly dilated. Right Atrium: Right atrial size was mildly dilated. Pericardium: There is no evidence of pericardial effusion. Mitral Valve: The mitral valve is grossly normal. Mild mitral valve regurgitation. Tricuspid Valve: The tricuspid valve is grossly normal. Tricuspid valve regurgitation is mild. Aortic Valve: The aortic valve is grossly normal. Aortic valve regurgitation is mild. Aortic valve mean gradient measures  2.5 mmHg. Aortic valve peak gradient measures 4.2 mmHg. Aortic valve area, by VTI measures 2.79 cm. Pulmonic Valve: The pulmonic valve was not well visualized. Pulmonic valve regurgitation is trivial. Aorta: The aortic root is normal in size and structure. IAS/Shunts: The interatrial septum was not assessed.  LEFT VENTRICLE PLAX 2D LVIDd:         5.01 cm LVIDs:         4.11 cm LV PW:         1.38 cm LV IVS:        1.15 cm LVOT diam:     2.20 cm LV SV:         51 LV SV Index:   23 LVOT Area:     3.80 cm  RIGHT VENTRICLE RV Basal diam:  3.53 cm RV S prime:     9.57 cm/s TAPSE (M-mode): 4.3 cm LEFT ATRIUM              Index       RIGHT ATRIUM           Index LA diam:        4.70 cm  2.10 cm/m  RA Area:     39.70 cm LA Vol (A2C):   100.0 ml 44.71 ml/m RA Volume:   157.00 ml 70.19 ml/m LA Vol (A4C):   163.0 ml 72.87 ml/m LA Biplane Vol: 137.0 ml 61.25 ml/m  AORTIC VALVE                   PULMONIC VALVE AV Area (Vmax):    2.07 cm    RVOT Peak grad: 1 mmHg AV Area (Vmean):   2.01 cm AV Area (VTI):     2.79 cm AV Vmax:           103.00 cm/s AV Vmean:          69.550 cm/s AV VTI:            0.181 m AV Peak Grad:      4.2 mmHg AV Mean Grad:      2.5 mmHg LVOT Vmax:         56.10 cm/s LVOT Vmean:        36.800 cm/s LVOT VTI:          0.133 m LVOT/AV VTI ratio: 0.73  AORTA Ao Root diam: 3.10 cm MITRAL VALVE  TRICUSPID VALVE MV Area (PHT): 4.74 cm     TR Peak grad:   18.7 mmHg MV Decel Time: 160 msec     TR Vmax:        216.00 cm/s MV E velocity: 123.00 cm/s                             SHUNTS                             Systemic VTI:  0.13 m                             Systemic Diam: 2.20 cm Bartholome Bill MD Electronically signed by Bartholome Bill MD Signature Date/Time: 09/13/2019/11:29:55 AM    Final     Scheduled Meds: . allopurinol  300 mg Oral Daily  . amLODipine  10 mg Oral Daily  . apixaban  5 mg Oral BID  . atorvastatin  80 mg Oral QPM  . colchicine  0.6 mg Oral Daily  . furosemide  20 mg  Oral Daily  . gabapentin  400 mg Oral TID  . hydrALAZINE  50 mg Oral Q8H  . insulin aspart  0-5 Units Subcutaneous QHS  . insulin aspart  0-9 Units Subcutaneous TID WC  . lidocaine  1 patch Transdermal Q12H  . losartan  100 mg Oral Daily  . nicotine  7 mg Transdermal Daily  . potassium chloride  40 mEq Oral BID    Assessment/Plan:  1. 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  Patient already on Eliquis.  Continue high-dose atorvastatin.  Physical therapy recommending CIR. they will wait to see how he progresses over the weekend to see how he does on whether he is a candidate and would still need insurance authorization. 2. Bilateral vision loss.  Appreciate ophthalmology consultation likely from prior stroke and current stroke.  On Eliquis for anticoagulation 3. Accelerated hypertension this a.m.  Continue Cozaar and low-dose Lasix.  I added hydralazine today and increase the dose of Norvasc.  Had to hold the patient's beta-blocker secondary to bradycardia. 4. Bradycardia and 2.5-second pause.  Hold metoprolol continue to monitor on telemetry 5. Chronic atrial fibrillation.  Holding beta-blocker secondary to pause and low heart rates overnight on presentation.  On Eliquis for anticoagulation 6. Type 2 diabetes mellitus with hyperlipidemia.  Zocor switched over to Lipitor.  Hemoglobin A1c excellent at 6.2. 7. Tobacco abuse on nicotine patch 8. Few beats of nonsustained ventricular tachycardia.  Electrolytes given the other day.  Code Status:     Code Status Orders  (From admission, onward)         Start     Ordered   09/12/19 1541  Full code  Continuous     09/12/19 1542        Code Status History    Date Active Date Inactive Code Status Order ID Comments User Context   04/07/2019 1210 04/11/2019 2052 Full Code [id number]  Para Skeans, MD ED   06/22/2018 1457 06/24/2018 1553 Full Code [id number]  Vaughan Basta, MD Inpatient    03/03/2017 1541 03/04/2017 1545 Full Code [id number]  Demetrios Loll, MD Inpatient   10/03/2016 1535 10/05/2016 1535 Full Code [id number]  [name], MD Inpatient   10/12/2014 0953 10/14/2014 1514 Full Code [id number]  Gladstone Lighter, MD  Inpatient   Advance Care Planning Activity     Family Communication: Spoke with the patient's friend Farrel Gobble on the phone Disposition Plan: With the patient's blood pressure again being high this morning I decided to titrate blood pressure medications today.  Still looking into seeing IR as an option.  With the patient's poor vision and living by himself, he will need to depend on friends and family to help him out.  He will not be able to drive with his poor vision.  Consultants:  Neurology  Ophthalmology  Time spent: 27 minutes  [place]

## 2019-09-15 LAB — GLUCOSE, CAPILLARY
Glucose-Capillary: 99 mg/dL (ref 70–99)
Glucose-Capillary: 108 mg/dL — ABNORMAL HIGH (ref 70–99)
Glucose-Capillary: 88 mg/dL (ref 70–99)
Glucose-Capillary: 87 mg/dL (ref 70–99)

## 2019-09-15 MED ORDER — HYDRALAZINE HCL 25 MG PO TABS
25.0000 mg | ORAL_TABLET | Freq: Three times a day (TID) | ORAL | Status: DC
  Administered 2019-09-15 – 2019-09-17 (×4): 25 mg via ORAL
  Filled 2019-09-15 (×5): qty 1

## 2019-09-15 MED ORDER — METOPROLOL TARTRATE 25 MG PO TABS
12.5000 mg | ORAL_TABLET | Freq: Two times a day (BID) | ORAL | Status: DC
  Administered 2019-09-15 – 2019-09-18 (×6): 12.5 mg via ORAL
  Filled 2019-09-15 (×7): qty 1

## 2019-09-15 NOTE — Progress Notes (Signed)
Physical Therapy Treatment Patient Details Name: [name] MRN: [id number] DOB: 18-May-1950 Today's Date: 09/15/2019    History of Present Illness Pt admitted for HTN with positive R temporal/occipital lobe CVA. PMH includes prior CVA, DVT, Afib, DM, and HTN. Pt with complaints of dizziness, HA, and speech difficulties.     PT Comments    Pt was awake in long sitting upon arriving. He agrees to PT session and is cooperative and pleasant throughout. Does endorse having severe headache but was willing to participate. He is A and O x 4 however requires increased time to process and time to express thoughts due to dysarthria. He was able to exit L side of bed with increased time and use of bed rails. HOB was elevated. Pt stood with min assist from lowest bed height with vcs for proper technique with use of SPC. Pt rely's on use of momentum to achieve standing. Once standing, pt demonstrates no LOB in static balance activities but does have unsteadiness with dynamic balance exercises. He ambulated 100 ft with SPC with min assist + two occasions of LOB with therapist intervention to prevent fall. Overall pt fatigues quickly and gets slightly frustrated when he has unsteadiness during gait. Pt is great CIR candidate and is progressing with acute PT well. He was seated in recliner post session with call bell in reach, chair alarm in place, and RN in room to address headache complaint. Pt will continue to benefit from skilled PT to assist pt with returning to PLOF.      Follow Up Recommendations  CIR     Equipment Recommendations  None recommended by PT    Recommendations for Other Services Rehab consult     Precautions / Restrictions Precautions Precautions: Fall Restrictions Weight Bearing Restrictions: No    Mobility  Bed Mobility Overal bed mobility: Needs Assistance Bed Mobility: Supine to Sit     Supine to sit: Supervision     General bed mobility comments: Pt was able to  exit L side of bed with supervision however HOB was elevated, bed rails used, and increased time to perform. Pt continues to have coordination deficits present.  Transfers Overall transfer level: Needs assistance Equipment used: Straight cane Transfers: Sit to/from Stand Sit to Stand: Min assist         General transfer comment: Min assist to stand from lowest bed height with vcs for correct sequencing and handplacement. Pt relys on momentum to stand by using rocking method.  Ambulation/Gait Ambulation/Gait assistance: Min assist Gait Distance (Feet): 100 Feet Assistive device: Straight cane Gait Pattern/deviations: Staggering right;Trunk flexed;Decreased step length - left;Decreased step length - right;Step-through pattern Gait velocity: decreased   General Gait Details: Pt was able to asmbulate 100 ft with [facility] however does require min assist to prevent LOB. he has two episodes of LOB with therapist intervention to correct. Pt fatigued quickly and requested to return to room.    Stairs             Wheelchair Mobility    Modified Rankin (Stroke Patients Only)       Balance Overall balance assessment: Needs assistance Sitting-balance support: Feet supported Sitting balance-Leahy Scale: Good Sitting balance - Comments: no LOB in sitting   Standing balance support: Single extremity supported Standing balance-Leahy Scale: Fair Standing balance comment: pt unsteady with dynamic balance/gait activities  Cognition Arousal/Alertness: Awake/alert Behavior During Therapy: WFL for tasks assessed/performed Overall Cognitive Status: Within Functional Limits for tasks assessed                                 General Comments: pt is A and O but does require incraesed time to process/dysarthria      Exercises      General Comments        Pertinent Vitals/Pain Pain Assessment: 0-10 Pain Score: 2  Pain Location:  headache Pain Descriptors / Indicators: Headache Pain Intervention(s): Limited activity within patient's tolerance;Monitored during session    Home Living                      Prior Function            PT Goals (current goals can now be found in the care plan section) Acute Rehab PT Goals Patient Stated Goal: I want to be able to do what I use to do Progress towards PT goals: Progressing toward goals    Frequency    7X/week      PT Plan Current plan remains appropriate    Co-evaluation              AM-PAC PT "6 Clicks" Mobility   Outcome Measure  Help needed turning from your back to your side while in a flat bed without using bedrails?: None Help needed moving from lying on your back to sitting on the side of a flat bed without using bedrails?: A Little Help needed moving to and from a bed to a chair (including a wheelchair)?: A Little Help needed standing up from a chair using your arms (e.g., wheelchair or bedside chair)?: A Little Help needed to walk in hospital room?: A Little Help needed climbing 3-5 steps with a railing? : A Lot 6 Click Score: 18    End of Session Equipment Utilized During Treatment: Gait belt Activity Tolerance: Patient tolerated treatment well;Patient limited by fatigue Patient left: in chair;with chair alarm set;with nursing/sitter in room;with call bell/phone within reach Nurse Communication: Mobility status PT Visit Diagnosis: Muscle weakness (generalized) (M62.81);Difficulty in walking, not elsewhere classified (R26.2);Unsteadiness on feet (R26.81)     Time: 9381-8299 PT Time Calculation (min) (ACUTE ONLY): 26 min  Charges:  $Gait Training: 8-22 mins $Neuromuscular Re-education: 8-22 mins                     Julaine Fusi PTA 09/15/19, 8:50 AM

## 2019-09-15 NOTE — Progress Notes (Signed)
Occupational Therapy Treatment Patient Details Name: [name] MRN: [id number] DOB: August 13, 1950 Today's Date: 09/15/2019    History of present illness Pt admitted for HTN with positive R temporal/occipital lobe CVA. PMH includes prior CVA, DVT, Afib, DM, and HTN. Pt with complaints of dizziness, HA, and speech difficulties.    OT comments  Pt seen for LB dressing skills and standing to use urinal and visually navigate ambulation in room followed by vision exercises.  He has decreased convergence in R eye and presents with homonymous hemianopsia and needs cues to learn how to compensate for this.  He needs extra time to communicate his thoughts and get words out with good articulation.  Difficulty with [facility] tasks including opening containers/packets requiring assist but able to use urinal with only SBA for balance but did not communicate needs appropriately since he said he would use urinal sitting but then proceeded to stand up. Pt stated he has less dizziness but currently had a headache. Pt instructed in continued visual compensatory strategies to improve safe access to environment, particularly table top items for meals, grooming, and IADL such as reading tasks. Pt demo'd difficulty with identifying letter, often confusing similarly shaped letters but with cues for visual rest breaks and using fingers to help pt visually scan L to R. Pt reports that sometimes he is unable to see something he is looking at but with brief visual rest breaks (with initial instruction) pt reports he sometimes is then able to see what he missed. Pt continues to demonstrate excellent motivation and good progress towards goals. Continues to be great candidate for CIR upon discharge to support pt's return to modified independent level or better in order to minimize falls, functional decline, and increased caregiver burden.   Follow Up Recommendations  CIR    Equipment Recommendations  None recommended by OT     Recommendations for Other Services      Precautions / Restrictions Precautions Precautions: Fall Restrictions Weight Bearing Restrictions: No       Mobility Bed Mobility                  Transfers                      Balance                                           ADL either performed or assessed with clinical judgement   ADL Overall ADL's : Needs assistance/impaired                                       General ADL Comments: Pt seen for LB dressing skills and standing to use urinal and visually navigate ambulation in room followed by vision exercises.  He has decreased convergence in R eye and presents with homonymous hemianopsia and needs cues to learn how to compensate for this.  He needs extra time to communicate his thoughts and get words out with good articulation.  Difficulty with [facility] tasks including opening containers/packets requiring assist but able to use urinal with only SBA for balance but did not communicate needs appropriately since he said he would use urinal sitting but then proceeded to stand up. Pt stated he has less dizziness but currently had a headache. Pt instructed  in continued visual compensatory strategies to improve safe access to environment, particularly table top items for meals, grooming, and IADL such as reading tasks. Pt demo'd difficulty with identifying letter, often confusing similarly shaped letters but with cues for visual rest breaks and using fingers to help pt visually scan L to R. Pt reports that sometimes he is unable to see something he is looking at but with brief visual rest breaks (with initial instruction) pt reports he sometimes is then able to see what he missed. Pt continues to demonstrate excellent motivation and good progress towards goals. Continues to be great candidate for CIR upon discharge to support pt's return to modified independent level or better in order to minimize falls,  functional decline, and increased caregiver burden.     Vision Patient Visual Report: Blurring of vision     Perception     Praxis      Cognition Arousal/Alertness: Awake/alert Behavior During Therapy: WFL for tasks assessed/performed Overall Cognitive Status: Within Functional Limits for tasks assessed                                 General Comments: pt is A and O but does require incraesed time to process/dysarthria        Exercises     Shoulder Instructions       General Comments      Pertinent Vitals/ Pain       Pain Assessment: 0-10 Pain Score: 2  Pain Location: headache Pain Descriptors / Indicators: Headache Pain Intervention(s): Limited activity within patient's tolerance;Monitored during session  Home Living                                          Prior Functioning/Environment              Frequency  Min 3X/week        Progress Toward Goals  OT Goals(current goals can now be found in the care plan section)  Progress towards OT goals: Progressing toward goals  Acute Rehab OT Goals Patient Stated Goal: I want to be able to do what I use to do OT Goal Formulation: With patient/family Time For Goal Achievement: 09/27/19 Potential to Achieve Goals: Good  Plan Discharge plan remains appropriate;Frequency remains appropriate    Co-evaluation                 AM-PAC OT "6 Clicks" Daily Activity     Outcome Measure   Help from another person eating meals?: None Help from another person taking care of personal grooming?: None Help from another person toileting, which includes using toliet, bedpan, or urinal?: A Little Help from another person bathing (including washing, rinsing, drying)?: A Little Help from another person to put on and taking off regular upper body clothing?: None Help from another person to put on and taking off regular lower body clothing?: A Little 6 Click Score: 21    End of  Session    OT Visit Diagnosis: Other abnormalities of gait and mobility (R26.89);Low vision, both eyes (H54.2)   Activity Tolerance Patient tolerated treatment well   Patient Left in bed;with call bell/phone within reach;with bed alarm set   Nurse Communication          Time: 1530-1600 OT Time Calculation (min): 30 min  Charges: OT General Charges $  OT Visit: 1 Visit OT Treatments $Self Care/Home Management : 8-22 mins $Therapeutic Activity: 8-22 mins  Chrys Racer, OTR/L, [place] [facility] [phone] 09/15/19, 4:15 PM

## 2019-09-15 NOTE — Progress Notes (Signed)
Patient ID: [name], male   DOB: 23-Jul-1950, 69 y.o.   MRN: [id number] [facility] PROGRESS NOTE  [name] CBS:[id number] DOB: 1950/08/26 DOA: 09/12/2019 PCP: Theotis Burrow, MD  HPI/Subjective: Patient thinks that he did better with physical therapy today but did get off balance.  Patient thinks his vision is a little bit better but still having trouble seeing fingers in front of his face and getting the right amount of fingers.  Objective: Vitals:   09/15/19 0743 09/15/19 1156  BP: (!) 147/93 115/69  Pulse: 71 69  Resp: 16 16  Temp: 97.9 F (36.6 C) (!) 97.5 F (36.4 C)  SpO2: 99% 100%    Intake/Output Summary (Last 24 hours) at 09/15/2019 1507 Last data filed at 09/15/2019 0436 Gross per 24 hour  Intake 240 ml  Output 300 ml  Net -60 ml   Filed Weights   09/12/19 1359  Weight: 97.1 kg    ROS: Review of Systems  Constitutional: Negative for fever.  Eyes: Positive for blurred vision. Negative for pain.  Respiratory: Negative for cough and shortness of breath.   Cardiovascular: Negative for chest pain.  Gastrointestinal: Negative for abdominal pain, nausea and vomiting.  Genitourinary: Negative for dysuria.  Musculoskeletal: Negative for joint pain.  Neurological: Negative for dizziness.   Exam: Physical Exam  HENT:  Nose: No mucosal edema.  Mouth/Throat: No oropharyngeal exudate or posterior oropharyngeal edema.  Eyes: Pupils are equal, round, and reactive to light. Conjunctivae and lids are normal.  Right eye slightly deviated out  Neck: Carotid bruit is not present.  Cardiovascular: S1 normal and S2 normal. An irregularly irregular rhythm present. Exam reveals no gallop.  No murmur heard. Respiratory: No respiratory distress. He has no wheezes. He has no rhonchi. He has no rales.  GI: Soft. Bowel sounds are normal. There is no abdominal tenderness.  Musculoskeletal:     Right ankle: Swelling present.     Left ankle: Swelling  present.  Lymphadenopathy:    He has no cervical adenopathy.  Neurological: He is alert.  Still with trouble seeing fingers in front of his face with both eyes.  Skin: Skin is warm. No rash noted. Nails show no clubbing.  Psychiatric: He has a normal mood and affect.      Data Reviewed: Basic Metabolic Panel: Recent Labs  Lab 09/12/19 1420 09/13/19 0524 09/14/19 0627  NA 142 139 136  K 3.2* 3.2* 3.9  CL 106 107 101  CO2 28 27 27   GLUCOSE 155* 95 102*  BUN 16 15 13   CREATININE 0.93 0.95 0.97  CALCIUM 8.8* 8.6* 9.0  MG  --   --  1.9   Liver Function Tests: Recent Labs  Lab 09/12/19 1420  AST 24  ALT 17  ALKPHOS 72  BILITOT 0.7  PROT 6.6  ALBUMIN 3.6   CBC: Recent Labs  Lab 09/12/19 1420  WBC 3.0*  NEUTROABS 1.6*  HGB 11.7*  HCT 37.9*  MCV 81.5  PLT 152   BNP (last 3 results) Recent Labs    04/08/19 0848  BNP 298.0*     CBG: Recent Labs  Lab 09/14/19 1148 09/14/19 1643 09/14/19 2111 09/15/19 0741 09/15/19 1153  GLUCAP 121* 101* 102* 99 108*    Recent Results (from the past 240 hour(s))  Respiratory Panel by RT PCR (Flu A&B, Covid) - Nasopharyngeal Swab     Status: None   Collection Time: 09/12/19  3:20 PM   Specimen: Nasopharyngeal Swab  Result Value Ref Range  Status   SARS Coronavirus 2 by RT PCR NEGATIVE NEGATIVE Final    Comment: (NOTE) SARS-CoV-2 target nucleic acids are NOT DETECTED. The SARS-CoV-2 RNA is generally detectable in upper respiratoy specimens during the acute phase of infection. The lowest concentration of SARS-CoV-2 viral copies this assay can detect is 131 copies/mL. A negative result does not preclude SARS-Cov-2 infection and should not be used as the sole basis for treatment or other patient management decisions. A negative result may occur with  improper specimen collection/handling, submission of specimen other than nasopharyngeal swab, presence of viral mutation(s) within the areas targeted by this assay, and  inadequate number of viral copies (<131 copies/mL). A negative result must be combined with clinical observations, patient history, and epidemiological information. The expected result is Negative. Fact Sheet for Patients:  PinkCheek.be Fact Sheet for Healthcare Providers:  GravelBags.it This test is not yet ap proved or cleared by the [place] FDA and  has been authorized for detection and/or diagnosis of SARS-CoV-2 by FDA under an Emergency Use Authorization (EUA). This EUA will remain  in effect (meaning this test can be used) for the duration of the COVID-19 declaration under Section 564(b)(1) of the Act, 21 U.S.C. section 360bbb-3(b)(1), unless the authorization is terminated or revoked sooner.    Influenza A by PCR NEGATIVE NEGATIVE Final   Influenza B by PCR NEGATIVE NEGATIVE Final    Comment: (NOTE) The Xpert Xpress SARS-CoV-2/FLU/RSV assay is intended as an aid in  the diagnosis of influenza from Nasopharyngeal swab specimens and  should not be used as a sole basis for treatment. Nasal washings and  aspirates are unacceptable for Xpert Xpress SARS-CoV-2/FLU/RSV  testing. Fact Sheet for Patients: PinkCheek.be Fact Sheet for Healthcare Providers: GravelBags.it This test is not yet approved or cleared by the [place] FDA and  has been authorized for detection and/or diagnosis of SARS-CoV-2 by  FDA under an Emergency Use Authorization (EUA). This EUA will remain  in effect (meaning this test can be used) for the duration of the  Covid-19 declaration under Section 564(b)(1) of the Act, 21  U.S.C. section 360bbb-3(b)(1), unless the authorization is  terminated or revoked. Performed at [facility], [address] [place], [place] [address]       Scheduled Meds: . allopurinol  300 mg Oral Daily  . amLODipine  10 mg Oral Daily  . apixaban   5 mg Oral BID  . atorvastatin  80 mg Oral QPM  . colchicine  0.6 mg Oral Daily  . furosemide  20 mg Oral Daily  . gabapentin  400 mg Oral TID  . hydrALAZINE  50 mg Oral Q8H  . insulin aspart  0-5 Units Subcutaneous QHS  . insulin aspart  0-9 Units Subcutaneous TID WC  . lidocaine  1 patch Transdermal Q12H  . losartan  100 mg Oral Daily  . metoprolol tartrate  12.5 mg Oral BID  . nicotine  7 mg Transdermal Daily  . potassium chloride  40 mEq Oral BID    Assessment/Plan:  1. 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  Patient already on Eliquis.  Continue high-dose atorvastatin.  Physical therapy recommending CIR. potential disposition to CIR on Monday. 2. Bilateral vision loss.  This is likely from prior stroke and current stroke.  On Eliquis for anticoagulation.  Follow-up with ophthalmology as outpatient. 3. Accelerated hypertension.  Blood pressure up this a.m. but down this afternoon.  Cardiology restarted low-dose metoprolol.  Continue  Cozaar, Lasix and hydralazine 4. Patient with fast heart rate with ambulation.  Bradycardia and pauses overnight.  Cardiology thinking that he does have sleep apnea and likely the cause of these.  He is okay restarting metoprolol at low-dose 5. Chronic atrial fibrillation.  Restarted low-dose metoprolol.  On Eliquis for anticoagulation. 6. Type 2 diabetes mellitus with hyperlipidemia.  Continue high-dose Lipitor.  Hemoglobin A1c excellent at 6.2. 7. Tobacco abuse on nicotine patch 8. Few beats of nonsustained ventricular tachycardia.  Electrolytes given the other day.  Code Status:     Code Status Orders  (From admission, onward)         Start     Ordered   09/12/19 1541  Full code  Continuous     09/12/19 1542        Code Status History    Date Active Date Inactive Code Status Order ID Comments User Context   04/07/2019 1210 04/11/2019 2052 Full Code [id number]  Para Skeans, MD ED   06/22/2018 1457  06/24/2018 1553 Full Code [id number]  Vaughan Basta, MD Inpatient   03/03/2017 1541 03/04/2017 1545 Full Code [id number]  Demetrios Loll, MD Inpatient   10/03/2016 1535 10/05/2016 1535 Full Code [id number]  [name], MD Inpatient   10/12/2014 0953 10/14/2014 1514 Full Code [id number]  Gladstone Lighter, MD Inpatient   Advance Care Planning Activity     Family Communication: Spoke with the patient's friend Farrel Gobble at the bedside Disposition Plan: Hopeful disposition to acute inpatient rehab on Monday pending insurance authorization.  Because of his poor vision, I told him he cannot drive anymore.  Consultants:  Neurology  Ophthalmology  Time spent: 27 minutes  [place]

## 2019-09-15 NOTE — Progress Notes (Signed)
Patient resting in bed. mNIHSS performed throughout day, no changes from previous assessment.

## 2019-09-15 NOTE — Progress Notes (Signed)
Patient Name: [name] Date of Encounter: 09/15/2019  Hospital Problem List     Active Problems:   Accelerated hypertension   Essential hypertension   Chronic a-fib (HCC)   Dizziness   CVA (cerebral vascular accident) ([employer])   Vision loss, bilateral   Tobacco abuse   Bradycardia   Non-sustained ventricular tachycardia [facility])    Patient Profile     69 y.o.malewith history of48 year old male with history of chronic atrial fibrillation anticoagulated with Eliquis which he states he is compliant with twice daily, diabetes mellitus, hypertension, use of beta-blockers as an outpatient who presented to the emergency room with complaints of dizziness and lightheadedness. In the emergency room he was relatively hypokalemic with a potassium of 3.2 and a creatinine of 0.93. His PT and APTT were normal. EKG revealed atrial fibrillation with slow ventricular response with a ventricular rate in the mid 50s. He was hemodynamically stable. On telemetry he developed transient bradycardia and a 2.5-second pause while sleeping. This a.m. patient continues to complain of dizziness and visual changes. Echo done yesterday showed EF of 40-45% similar to previous echo.   Subjective   Still with some visual changes and instability with gait.  I had further less than 3-second pauses during sleeping hours.  Patient asymptomatic.  Inpatient Medications    . allopurinol  300 mg Oral Daily  . amLODipine  10 mg Oral Daily  . apixaban  5 mg Oral BID  . atorvastatin  80 mg Oral QPM  . colchicine  0.6 mg Oral Daily  . furosemide  20 mg Oral Daily  . gabapentin  400 mg Oral TID  . hydrALAZINE  50 mg Oral Q8H  . insulin aspart  0-5 Units Subcutaneous QHS  . insulin aspart  0-9 Units Subcutaneous TID WC  . lidocaine  1 patch Transdermal Q12H  . losartan  100 mg Oral Daily  . metoprolol tartrate  12.5 mg Oral BID  . nicotine  7 mg Transdermal Daily  . potassium chloride  40 mEq Oral BID     Vital Signs    Vitals:   09/14/19 2014 09/15/19 0044 09/15/19 0435 09/15/19 0743  BP: (!) 163/92 (!) 141/84 (!) 149/102 (!) 147/93  Pulse: 79 62 68 71  Resp: 17 18 18 16   Temp: 98 F (36.7 C) 97.7 F (36.5 C) 98 F (36.7 C) 97.9 F (36.6 C)  TempSrc: Oral Oral Oral Oral  SpO2: 100% 98% 99% 99%  Weight:      Height:        Intake/Output Summary (Last 24 hours) at 09/15/2019 1019 Last data filed at 09/15/2019 0436 Gross per 24 hour  Intake 240 ml  Output 300 ml  Net -60 ml   Filed Weights   09/12/19 1359  Weight: 97.1 kg    Physical Exam    GEN: Well nourished, well developed, in no acute distress.  HEENT: normal.  Neck: Supple, no JVD, carotid bruits, or masses. Cardiac: RRR, no murmurs, rubs, or gallops. No clubbing, cyanosis, edema.  Radials/DP/PT 2+ and equal bilaterally.  Respiratory:  Respirations regular and unlabored, clear to auscultation bilaterally. GI: Soft, nontender, nondistended, BS + x 4. MS: no deformity or atrophy. Skin: warm and dry, no rash. Neuro:  Strength and sensation are intact. Psych: Normal affect.  Labs    CBC Recent Labs    09/12/19 1420  WBC 3.0*  NEUTROABS 1.6*  HGB 11.7*  HCT 37.9*  MCV 81.5  PLT 481   Basic Metabolic Panel  Recent Labs    09/13/19 0524 09/14/19 0627  NA 139 136  K 3.2* 3.9  CL 107 101  CO2 27 27  GLUCOSE 95 102*  BUN 15 13  CREATININE 0.95 0.97  CALCIUM 8.6* 9.0  MG  --  1.9   Liver Function Tests Recent Labs    09/12/19 1420  AST 24  ALT 17  ALKPHOS 72  BILITOT 0.7  PROT 6.6  ALBUMIN 3.6   No results for input(s): LIPASE, AMYLASE in the last 72 hours. Cardiac Enzymes No results for input(s): CKTOTAL, CKMB, CKMBINDEX, TROPONINI in the last 72 hours. BNP No results for input(s): BNP in the last 72 hours. D-Dimer No results for input(s): DDIMER in the last 72 hours. Hemoglobin A1C Recent Labs    09/12/19 1420  HGBA1C 6.2*   Fasting Lipid Panel Recent Labs    09/13/19 0524   CHOL 132  HDL 50  LDLCALC 72  TRIG 48  CHOLHDL 2.6   Thyroid Function Tests No results for input(s): TSH, T4TOTAL, T3FREE, THYROIDAB in the last 72 hours.  Invalid input(s): FREET3  Telemetry    Atrial fibrillation with slow ventricular response.  Several less than 3-second pauses during sleeping hours.  ECG    Atrial fibrillation with slow ventricular response  Radiology    CT ANGIO HEAD W OR WO CONTRAST  Result Date: 09/12/2019 CLINICAL DATA:  69 year old male code stroke presentation this afternoon. History of chronic cerebral infarcts with age indeterminate right cerebellar infarct on plain CT. EXAM: CT ANGIOGRAPHY HEAD AND NECK TECHNIQUE: Multidetector CT imaging of the head and neck was performed using the standard protocol during bolus administration of intravenous contrast. Multiplanar CT image reconstructions and MIPs were obtained to evaluate the vascular anatomy. Carotid stenosis measurements (when applicable) are obtained utilizing NASCET criteria, using the distal internal carotid diameter as the denominator. CONTRAST:  81mL OMNIPAQUE IOHEXOL 350 MG/ML SOLN COMPARISON:  Plain head CT 1409 hours today. Carotid Doppler ultrasound 10/03/2016. Head MRA 10/04/2016. FINDINGS: CTA NECK Skeleton: Diffuse idiopathic skeletal hyperostosis (DISH). Bulky and flowing osteophytes throughout the visible spine. Absent maxillary dentition. No acute osseous abnormality identified. Upper chest: Mild upper lung scarring. Upper limits of normal superior mediastinal lymph nodes. Other neck: 2 cm anterior right thyroid lobe hypodense nodule which meets size criteria for ultrasound follow-up (ref: J Am Coll Radiol. 2015 Feb;12(2): 143-50).The glottis is closed. Partial effaced pharynx also. Negative for neck mass or lymphadenopathy. Aortic arch: 3 vessel arch configuration with mild arch atherosclerosis. Right carotid system: Mild brachiocephalic atherosclerosis without stenosis. Negative right CCA  origin. Mild calcified plaque proximal to the bifurcation. Moderate mixed soft and calcified plaque at the right ICA origin and bulb, but less than 50 % stenosis with respect to the distal vessel. Tortuous right ICA just below the skull. Left carotid system: Soft and calcified plaque at the left CCA origin with less than 50 % stenosis with respect to the distal vessel. Occasional mild calcified plaque proximal to the bifurcation. Bulky calcified plaque at the left ICA origin and bulb with up to 50 % stenosis with respect to the distal vessel. Tortuous left ICA just below the skull. Vertebral arteries: Proximal right subclavian artery plaque without stenosis. The right vertebral artery origin is difficult to identify with suspected at least moderate stenosis on series 7, image 179. The right vertebral is somewhat diminutive but otherwise patent to the skull base without stenosis. Mild proximal left subclavian artery plaque without stenosis. Dominant left vertebral artery. Similar to  the right side evidence of moderate to severe left vertebral artery origin stenosis due to atherosclerosis on series 8, image 160. Some of the V1 segment is obscured by dense paravertebral venous contrast. But the left vertebral artery remains dominant and patent to the skull base without additional stenosis. CTA HEAD Posterior circulation: Mild left V4 calcified plaque without stenosis. Non dominant right vertebral artery functionally terminates in PICA. Both PICA vessels appear to be patent. Tortuous left vertebrobasilar junction and basilar artery. No basilar stenosis. Patent SCA and PCA origins. Posterior communicating arteries are diminutive or absent. Bilateral PCA branches are within normal limits, the left mildly asymmetrically smaller in the setting of chronic left distal PCA infarct. Anterior circulation: Both ICA siphons are patent and tortuous. Bilateral siphon calcified plaque does not result in significant stenosis. Patent  carotid termini, MCA and ACA origins. Codominant A1 segments. Anterior communicating artery and bilateral ACA branches are within normal limits. Left MCA M1 segment and bifurcation are patent without stenosis. Left MCA branches are within normal limits. Right MCA M1 segment and bifurcation are patent without stenosis. Right MCA branches are within normal limits. Venous sinuses: Patent. Anatomic variants: Dominant left vertebral artery which primarily supplies the basilar. Review of the MIP images confirms the above findings IMPRESSION: 1. Negative for large vessel occlusion. 2. 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 The left vertebral artery is dominant and primarily supplies the basilar without additional stenosis. Both PICA vessels appear to remain patent. 3. Bilateral cervical and intracranial carotid atherosclerosis. Up to 50% stenosis at the left ICA origin, no hemodynamically significant stenosis elsewhere. 4. A 2 cm right thyroid lobe nodule which meets size criteria for Thyroid Ultrasound follow-up. 5. Diffuse idiopathic skeletal hyperostosis (DISH). Electronically Signed   By: Genevie Ann M.D.   On: 09/12/2019 14:59   CT HEAD WO CONTRAST  Result Date: 09/13/2019 CLINICAL DATA:  Vision loss. EXAM: CT HEAD WITHOUT CONTRAST TECHNIQUE: Contiguous axial images were obtained from the base of the skull through the vertex without intravenous contrast. COMPARISON:  September 12, 2019. FINDINGS: Brain: Right occipital low density is noted most consistent with acute infarction. Ventricular size is within normal limits. No midline shift is noted. Old left frontal and occipital infarctions are noted. No definite hemorrhage is noted. Vascular: No hyperdense vessel or unexpected calcification. Skull: Normal. Negative for fracture or focal lesion. Sinuses/Orbits: No acute finding. Other: None. IMPRESSION: Right occipital low density is noted most consistent with acute infarction as  described on prior MRI. Old left frontal and occipital infarctions are noted. No definite hemorrhage is noted. Electronically Signed   By: Marijo Conception M.D.   On: 09/13/2019 14:14   CT ANGIO NECK W OR WO CONTRAST  Result Date: 09/12/2019 CLINICAL DATA:  69 year old male code stroke presentation this afternoon. History of chronic cerebral infarcts with age indeterminate right cerebellar infarct on plain CT. EXAM: CT ANGIOGRAPHY HEAD AND NECK TECHNIQUE: Multidetector CT imaging of the head and neck was performed using the standard protocol during bolus administration of intravenous contrast. Multiplanar CT image reconstructions and MIPs were obtained to evaluate the vascular anatomy. Carotid stenosis measurements (when applicable) are obtained utilizing NASCET criteria, using the distal internal carotid diameter as the denominator. CONTRAST:  68mL OMNIPAQUE IOHEXOL 350 MG/ML SOLN COMPARISON:  Plain head CT 1409 hours today. Carotid Doppler ultrasound 10/03/2016. Head MRA 10/04/2016. FINDINGS: CTA NECK Skeleton: Diffuse idiopathic skeletal hyperostosis (DISH). Bulky and flowing osteophytes throughout the visible spine. Absent maxillary dentition. No  acute osseous abnormality identified. Upper chest: Mild upper lung scarring. Upper limits of normal superior mediastinal lymph nodes. Other neck: 2 cm anterior right thyroid lobe hypodense nodule which meets size criteria for ultrasound follow-up (ref: J Am Coll Radiol. 2015 Feb;12(2): 143-50).The glottis is closed. Partial effaced pharynx also. Negative for neck mass or lymphadenopathy. Aortic arch: 3 vessel arch configuration with mild arch atherosclerosis. Right carotid system: Mild brachiocephalic atherosclerosis without stenosis. Negative right CCA origin. Mild calcified plaque proximal to the bifurcation. Moderate mixed soft and calcified plaque at the right ICA origin and bulb, but less than 50 % stenosis with respect to the distal vessel. Tortuous right ICA  just below the skull. Left carotid system: Soft and calcified plaque at the left CCA origin with less than 50 % stenosis with respect to the distal vessel. Occasional mild calcified plaque proximal to the bifurcation. Bulky calcified plaque at the left ICA origin and bulb with up to 50 % stenosis with respect to the distal vessel. Tortuous left ICA just below the skull. Vertebral arteries: Proximal right subclavian artery plaque without stenosis. The right vertebral artery origin is difficult to identify with suspected at least moderate stenosis on series 7, image 179. The right vertebral is somewhat diminutive but otherwise patent to the skull base without stenosis. Mild proximal left subclavian artery plaque without stenosis. Dominant left vertebral artery. Similar to the right side evidence of moderate to severe left vertebral artery origin stenosis due to atherosclerosis on series 8, image 160. Some of the V1 segment is obscured by dense paravertebral venous contrast. But the left vertebral artery remains dominant and patent to the skull base without additional stenosis. CTA HEAD Posterior circulation: Mild left V4 calcified plaque without stenosis. Non dominant right vertebral artery functionally terminates in PICA. Both PICA vessels appear to be patent. Tortuous left vertebrobasilar junction and basilar artery. No basilar stenosis. Patent SCA and PCA origins. Posterior communicating arteries are diminutive or absent. Bilateral PCA branches are within normal limits, the left mildly asymmetrically smaller in the setting of chronic left distal PCA infarct. Anterior circulation: Both ICA siphons are patent and tortuous. Bilateral siphon calcified plaque does not result in significant stenosis. Patent carotid termini, MCA and ACA origins. Codominant A1 segments. Anterior communicating artery and bilateral ACA branches are within normal limits. Left MCA M1 segment and bifurcation are patent without stenosis. Left  MCA branches are within normal limits. Right MCA M1 segment and bifurcation are patent without stenosis. Right MCA branches are within normal limits. Venous sinuses: Patent. Anatomic variants: Dominant left vertebral artery which primarily supplies the basilar. Review of the MIP images confirms the above findings IMPRESSION: 1. Negative for large vessel occlusion. 2. 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 The left vertebral artery is dominant and primarily supplies the basilar without additional stenosis. Both PICA vessels appear to remain patent. 3. Bilateral cervical and intracranial carotid atherosclerosis. Up to 50% stenosis at the left ICA origin, no hemodynamically significant stenosis elsewhere. 4. A 2 cm right thyroid lobe nodule which meets size criteria for Thyroid Ultrasound follow-up. 5. Diffuse idiopathic skeletal hyperostosis (DISH). Electronically Signed   By: Genevie Ann M.D.   On: 09/12/2019 14:59   MR BRAIN WO CONTRAST  Result Date: 09/12/2019 CLINICAL DATA:  Ataxia, stroke suspected. EXAM: MRI HEAD WITHOUT CONTRAST TECHNIQUE: Multiplanar, multiecho pulse sequences of the brain and surrounding structures were obtained without intravenous contrast. COMPARISON:  Noncontrast CT head and CT angiogram head/neck performed earlier the same  day 09/12/2019 FINDINGS: Brain: There is cortical/subcortical restricted diffusion consistent with acute infarction within the right PCA vascular territory involving the posteromedial right temporal lobe and right occipital lobe. This encompasses a region of approximately 6 cm. Patchy corresponding T2/FLAIR hyperintensity at this site. There is no significant mass effect at this time. Redemonstrated chronic cortically based infarct within the left MCA vascular territory within the left frontoparietal lobes. Mild chronic hemosiderin deposition at this site. Redemonstrated chronic left PCA territory cortically based occipital lobe  infarct. Chronic lacunar infarct within the superior right cerebellar hemisphere. Mild ill-defined T2/FLAIR hyperintensity within the pons consistent with chronic small ischemic changes. Mild generalized parenchymal atrophy. Incidentally noted cavum septum pellucidum and cavum vergae. No evidence of intracranial mass. No chronic intracranial blood products. No extra-axial fluid collection. No midline shift. Vascular: Expected proximal arterial flow voids. Skull and upper cervical spine: No focal marrow lesion. Sinuses/Orbits: Visualized orbits show no acute finding. Mild ethmoid and maxillary sinus mucosal thickening. No significant mastoid effusion IMPRESSION: 1. Acute cortical/subcortical infarct within the posteromedial right temporal lobe and right occipital lobe within the right PCA vascular territory. 2. Redemonstrated chronic infarcts within the left frontoparietal lobes, left occipital lobe and superior right cerebellum. 3. Mild chronic small vessel ischemic changes within the pons. 4. Mild generalized parenchymal atrophy. 5. Mild paranasal sinus mucosal thickening. Electronically Signed   By: Kellie Simmering DO   On: 09/12/2019 18:19   ECHOCARDIOGRAM COMPLETE  Result Date: 09/13/2019    ECHOCARDIOGRAM REPORT   Patient Name:   JAYQUON Kalp Date of Exam: 09/13/2019 Medical Rec #:  [id number]          Height:       74.0 in Accession #:    [id number]         Weight:       214.0 lb Date of Birth:  [birth year]           BSA:          2.237 m Patient Age:    51 years           BP:           168/92 mmHg Patient Gender: M                  HR:           66 bpm. Exam Location:  [facility] Procedure: 2D Echo, Cardiac Doppler and Color Doppler Indications:     Stroke 434.91  History:         Patient has prior history of Echocardiogram examinations, most                  recent 04/08/2019. Stroke, Arrythmias:Atrial Fibrillation; Risk                  Factors:Hypertension. Pulmonary embolism.  Sonographer:     Sherrie Sport  RDCS (AE) Referring Phys:  [id number] Loletha [name] Diagnosing Phys: Bartholome Bill MD IMPRESSIONS  1. Left ventricular ejection fraction, by estimation, is 40 to 45%. The left ventricle has mildly decreased function. The left ventricle has no regional wall motion abnormalities. The left ventricular internal cavity size was mildly dilated. There is mild left ventricular hypertrophy. Left ventricular diastolic parameters are consistent with Grade I diastolic dysfunction (impaired relaxation).  2. Right ventricular systolic function is normal. The right ventricular size is mildly enlarged. There is normal pulmonary artery systolic pressure.  3. Left atrial size was mildly dilated.  4. Right atrial size was mildly  dilated.  5. The mitral valve is grossly normal. Mild mitral valve regurgitation.  6. The aortic valve is grossly normal. Aortic valve regurgitation is mild. FINDINGS  Left Ventricle: Left ventricular ejection fraction, by estimation, is 40 to 45%. The left ventricle has mildly decreased function. The left ventricle has no regional wall motion abnormalities. The left ventricular internal cavity size was mildly dilated. There is mild left ventricular hypertrophy. Left ventricular diastolic parameters are consistent with Grade I diastolic dysfunction (impaired relaxation). Right Ventricle: The right ventricular size is mildly enlarged. No increase in right ventricular wall thickness. Right ventricular systolic function is normal. There is normal pulmonary artery systolic pressure. The tricuspid regurgitant velocity is 2.16  m/s, and with an assumed right atrial pressure of 10 mmHg, the estimated right ventricular systolic pressure is 24.2 mmHg. Left Atrium: Left atrial size was mildly dilated. Right Atrium: Right atrial size was mildly dilated. Pericardium: There is no evidence of pericardial effusion. Mitral Valve: The mitral valve is grossly normal. Mild mitral valve regurgitation. Tricuspid Valve: The tricuspid  valve is grossly normal. Tricuspid valve regurgitation is mild. Aortic Valve: The aortic valve is grossly normal. Aortic valve regurgitation is mild. Aortic valve mean gradient measures 2.5 mmHg. Aortic valve peak gradient measures 4.2 mmHg. Aortic valve area, by VTI measures 2.79 cm. Pulmonic Valve: The pulmonic valve was not well visualized. Pulmonic valve regurgitation is trivial. Aorta: The aortic root is normal in size and structure. IAS/Shunts: The interatrial septum was not assessed.  LEFT VENTRICLE PLAX 2D LVIDd:         5.01 cm LVIDs:         4.11 cm LV PW:         1.38 cm LV IVS:        1.15 cm LVOT diam:     2.20 cm LV SV:         51 LV SV Index:   23 LVOT Area:     3.80 cm  RIGHT VENTRICLE RV Basal diam:  3.53 cm RV S prime:     9.57 cm/s TAPSE (M-mode): 4.3 cm LEFT ATRIUM              Index       RIGHT ATRIUM           Index LA diam:        4.70 cm  2.10 cm/m  RA Area:     39.70 cm LA Vol (A2C):   100.0 ml 44.71 ml/m RA Volume:   157.00 ml 70.19 ml/m LA Vol (A4C):   163.0 ml 72.87 ml/m LA Biplane Vol: 137.0 ml 61.25 ml/m  AORTIC VALVE                   PULMONIC VALVE AV Area (Vmax):    2.07 cm    RVOT Peak grad: 1 mmHg AV Area (Vmean):   2.01 cm AV Area (VTI):     2.79 cm AV Vmax:           103.00 cm/s AV Vmean:          69.550 cm/s AV VTI:            0.181 m AV Peak Grad:      4.2 mmHg AV Mean Grad:      2.5 mmHg LVOT Vmax:         56.10 cm/s LVOT Vmean:        36.800 cm/s LVOT VTI:  0.133 m LVOT/AV VTI ratio: 0.73  AORTA Ao Root diam: 3.10 cm MITRAL VALVE                TRICUSPID VALVE MV Area (PHT): 4.74 cm     TR Peak grad:   18.7 mmHg MV Decel Time: 160 msec     TR Vmax:        216.00 cm/s MV E velocity: 123.00 cm/s                             SHUNTS                             Systemic VTI:  0.13 m                             Systemic Diam: 2.20 cm Bartholome Bill MD Electronically signed by Bartholome Bill MD Signature Date/Time: 09/13/2019/11:29:55 AM    Final    CT HEAD CODE  STROKE WO CONTRAST  Result Date: 09/12/2019 CLINICAL DATA:  Code stroke. EXAM: CT HEAD WITHOUT CONTRAST TECHNIQUE: Contiguous axial images were obtained from the base of the skull through the vertex without intravenous contrast. COMPARISON:  2019 FINDINGS: Brain: There is no acute intracranial hemorrhage, mass effect, or edema. There is no new loss of gray-white differentiation. Chronic left MCA territory infarction involving the frontoparietal lobes and insula again identified. Additional chronic left occipital infarct is again seen. There is a new small infarct of the superior right cerebellum. Otherwise, patchy hypoattenuation in the supratentorial white matter is nonspecific but may reflect stable chronic microvascular ischemic changes. The ventricles and sulci are within normal limits in size and configuration apart from volume loss associated with infarcts. Vascular: There is no hyperdense vessel. Mild intracranial atherosclerotic calcification is present at the skull base. Skull: Unremarkable. Sinuses/Orbits: No acute abnormality. Relative prominence of the left superior ophthalmic vein may reflect a varix. Other: Mastoid air cells are clear. ASPECTS ([place] Stroke Program Early CT Score) - Ganglionic level infarction (caudate, lentiform nuclei, internal capsule, insula, M1-M3 cortex): 7 - Supraganglionic infarction (M4-M6 cortex): 3 Total score (0-10 with 10 being normal): 10, accounting for chronic infarct IMPRESSION: No acute intracranial hemorrhage or evidence of acute infarction. ASPECT score is 10 accounting for chronic infarct. New small infarct of the right cerebellum is favored to be chronic but age indeterminate. These results were called by telephone at the time of interpretation on 09/12/2019 at 2:17 pm to provider Lavonia Drafts , who verbally acknowledged these results. Electronically Signed   By: Macy Mis M.D.   On: 09/12/2019 14:25    Assessment & Plan    69 year old male with  history of chronic atrial fibrillation treated with anticoagulation and rate control with metoprolol, history of COPD, history of mildly reduced LV function by echocardiogram. EF approximately 40% which is not change from previous echo. Presented with dizziness and visual changes noted to have a right acute posterior medial temporal lobe and right occipital lobe CVA in the right posterior cerebral artery vascular territory.  1. Atrial fibrillation-states he is compliant with Eliquis. His PT and PTT are normal. While these values do not allow following of the levels, typically PTT is somewhat prolonged when patient is compliant with Eliquis. Restressed the importance of compliance with this. Will need to continue with this unless contraindicated from a neurologic standpoint.  Occasional wide-complex nonsustained rhythm.  Aberrant A. fib conduction versus ventricular run.  Patient ventricle is 40 to 45%.Patient does have pauses at night however this may suggest sleep apnea. .  Will need a Holter monitor as an outpatient.Does not appear to require pacemaker therapy at this point. Has episdoes of rvr during waking hours.  Will add metoprolol 12.5 mg bid and follow. Will need sleep study as outpatient  2. CVA-appreciate neurology's input.  We will continue with Eliquis and follow his rhythm.  3. Cardiomyopathy-mild cardiomyopathy EF around 40%.  Current echo shows no appreciable change.No evidence of congestive heart failure clinically. Would continue to follow. Low-sodium diet.Negative 2.5 liters since admission.   4. Diabetes mellitus-continue with medical management.  5. Hypertension-continue with careful diuresis following hemodynamics and electrolytes.  Patient remains relatively hypotensive.  Will remain on hydralazine at 50 mg 3 times daily, add back low-dose metoprolol and continue with amlodipine and losartan at current doses.  6. Hyperlipidemia-continue with atorvastatin 80  mg daily. LDL goal of 70. This can be followed as an outpatient.  Signed,  Signed, Javier Docker Davelyn Gwinn MD 09/15/2019, 10:19 AM  Pager: [phone]

## 2019-09-16 DIAGNOSIS — E119 Type 2 diabetes mellitus without complications: Secondary | ICD-10-CM

## 2019-09-16 LAB — GLUCOSE, CAPILLARY
Glucose-Capillary: 119 mg/dL — ABNORMAL HIGH (ref 70–99)
Glucose-Capillary: 78 mg/dL (ref 70–99)
Glucose-Capillary: 128 mg/dL — ABNORMAL HIGH (ref 70–99)
Glucose-Capillary: 100 mg/dL — ABNORMAL HIGH (ref 70–99)

## 2019-09-16 NOTE — Progress Notes (Signed)
No changes in NIHSS stroke assessment q4 hours on this shift.

## 2019-09-16 NOTE — Progress Notes (Signed)
Patient ID: [name], male   DOB: 1950/10/05, 69 y.o.   MRN: [id number] [facility] PROGRESS NOTE  [name] GYK:[id number] DOB: [birth year] DOA: 09/12/2019 PCP: Theotis Burrow, MD  HPI/Subjective: Patient thinks that he did better with physical therapy today but did get off balance.  Patient thinks his vision is a little bit better but still having trouble seeing fingers in front of his face and getting the right amount of fingers.  Objective: Vitals:   09/16/19 0750 09/16/19 1157  BP: 137/87 126/82  Pulse: 83 99  Resp: 16 16  Temp: 97.9 F (36.6 C) 97.7 F (36.5 C)  SpO2: 100% 100%    Intake/Output Summary (Last 24 hours) at 09/16/2019 1340 Last data filed at 09/16/2019 1020 Gross per 24 hour  Intake 720 ml  Output 1700 ml  Net -980 ml   Filed Weights   09/12/19 1359  Weight: 97.1 kg    ROS: Review of Systems  Constitutional: Negative for fever.  Eyes: Positive for blurred vision. Negative for pain.  Respiratory: Negative for cough and shortness of breath.   Cardiovascular: Negative for chest pain.  Gastrointestinal: Negative for abdominal pain, nausea and vomiting.  Genitourinary: Negative for dysuria.  Musculoskeletal: Negative for joint pain.  Neurological: Negative for dizziness.   Exam: Physical Exam  HENT:  Nose: No mucosal edema.  Mouth/Throat: No oropharyngeal exudate or posterior oropharyngeal edema.  Eyes: Pupils are equal, round, and reactive to light. Conjunctivae and lids are normal.  Right eye slightly deviated out  Neck: Carotid bruit is not present.  Cardiovascular: S1 normal and S2 normal. An irregularly irregular rhythm present. Exam reveals no gallop.  No murmur heard. Respiratory: No respiratory distress. He has no wheezes. He has no rhonchi. He has no rales.  GI: Soft. Bowel sounds are normal. There is no abdominal tenderness.  Musculoskeletal:     Right ankle: Swelling present.     Left ankle: Swelling present.   Lymphadenopathy:    He has no cervical adenopathy.  Neurological: He is alert.  Still with trouble seeing fingers in front of his face with both eyes.  Skin: Skin is warm. No rash noted. Nails show no clubbing.  Psychiatric: He has a normal mood and affect.      Data Reviewed: Basic Metabolic Panel: Recent Labs  Lab 09/12/19 1420 09/13/19 0524 09/14/19 0627  NA 142 139 136  K 3.2* 3.2* 3.9  CL 106 107 101  CO2 28 27 27   GLUCOSE 155* 95 102*  BUN 16 15 13   CREATININE 0.93 0.95 0.97  CALCIUM 8.8* 8.6* 9.0  MG  --   --  1.9   Liver Function Tests: Recent Labs  Lab 09/12/19 1420  AST 24  ALT 17  ALKPHOS 72  BILITOT 0.7  PROT 6.6  ALBUMIN 3.6   CBC: Recent Labs  Lab 09/12/19 1420  WBC 3.0*  NEUTROABS 1.6*  HGB 11.7*  HCT 37.9*  MCV 81.5  PLT 152   BNP (last 3 results) Recent Labs    04/08/19 0848  BNP 298.0*     CBG: Recent Labs  Lab 09/15/19 1153 09/15/19 1644 09/15/19 2114 09/16/19 0752 09/16/19 1158  GLUCAP 108* 88 87 119* 78    Recent Results (from the past 240 hour(s))  Respiratory Panel by RT PCR (Flu A&B, Covid) - Nasopharyngeal Swab     Status: None   Collection Time: 09/12/19  3:20 PM   Specimen: Nasopharyngeal Swab  Result Value Ref Range Status  SARS Coronavirus 2 by RT PCR NEGATIVE NEGATIVE Final    Comment: (NOTE) SARS-CoV-2 target nucleic acids are NOT DETECTED. The SARS-CoV-2 RNA is generally detectable in upper respiratoy specimens during the acute phase of infection. The lowest concentration of SARS-CoV-2 viral copies this assay can detect is 131 copies/mL. A negative result does not preclude SARS-Cov-2 infection and should not be used as the sole basis for treatment or other patient management decisions. A negative result may occur with  improper specimen collection/handling, submission of specimen other than nasopharyngeal swab, presence of viral mutation(s) within the areas targeted by this assay, and inadequate  number of viral copies (<131 copies/mL). A negative result must be combined with clinical observations, patient history, and epidemiological information. The expected result is Negative. Fact Sheet for Patients:  PinkCheek.be Fact Sheet for Healthcare Providers:  GravelBags.it This test is not yet ap proved or cleared by the [place] FDA and  has been authorized for detection and/or diagnosis of SARS-CoV-2 by FDA under an Emergency Use Authorization (EUA). This EUA will remain  in effect (meaning this test can be used) for the duration of the COVID-19 declaration under Section 564(b)(1) of the Act, 21 U.S.C. section 360bbb-3(b)(1), unless the authorization is terminated or revoked sooner.    Influenza A by PCR NEGATIVE NEGATIVE Final   Influenza B by PCR NEGATIVE NEGATIVE Final    Comment: (NOTE) The Xpert Xpress SARS-CoV-2/FLU/RSV assay is intended as an aid in  the diagnosis of influenza from Nasopharyngeal swab specimens and  should not be used as a sole basis for treatment. Nasal washings and  aspirates are unacceptable for Xpert Xpress SARS-CoV-2/FLU/RSV  testing. Fact Sheet for Patients: PinkCheek.be Fact Sheet for Healthcare Providers: GravelBags.it This test is not yet approved or cleared by the [place] FDA and  has been authorized for detection and/or diagnosis of SARS-CoV-2 by  FDA under an Emergency Use Authorization (EUA). This EUA will remain  in effect (meaning this test can be used) for the duration of the  Covid-19 declaration under Section 564(b)(1) of the Act, 21  U.S.C. section 360bbb-3(b)(1), unless the authorization is  terminated or revoked. Performed at [facility], [address] [place], [place] [address]       Scheduled Meds: . allopurinol  300 mg Oral Daily  . amLODipine  10 mg Oral Daily  . apixaban  5 mg Oral  BID  . atorvastatin  80 mg Oral QPM  . colchicine  0.6 mg Oral Daily  . furosemide  20 mg Oral Daily  . gabapentin  400 mg Oral TID  . hydrALAZINE  25 mg Oral Q8H  . lidocaine  1 patch Transdermal Q12H  . losartan  100 mg Oral Daily  . metoprolol tartrate  12.5 mg Oral BID  . nicotine  7 mg Transdermal Daily  . potassium chloride  40 mEq Oral BID    Assessment/Plan:  1. 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  Patient already on Eliquis.  Continue high-dose atorvastatin.  Physical therapy recommending CIR. potential disposition to CIR on Monday.  Patient walked around better with physical therapy today but did walk into a chair that was in the hallway.  I think his vision is likely contributing to his unsteady gait. 2. Bilateral vision loss.  This is likely from prior stroke and current stroke.  On Eliquis for anticoagulation.  Follow-up with ophthalmology as outpatient. 3. Essential hypertension.  Blood pressure stable today.  Cardiology  restarted low-dose metoprolol yesterday.  Continue Cozaar, Lasix and hydralazine 4. Patient with fast heart rate with ambulation.  Bradycardia and pauses overnight.  Cardiology thinking that he does have sleep apnea and likely the cause of these. 5. Chronic atrial fibrillation.  Low-dose metoprolol.  On Eliquis for anticoagulation. 6. Type 2 diabetes mellitus with hyperlipidemia.  Continue high-dose Lipitor.  Hemoglobin A1c excellent at 6.2. 7. Tobacco abuse on nicotine patch 8. Few beats of nonsustained ventricular tachycardia.  Electrolytes given the other day.  Code Status:     Code Status Orders  (From admission, onward)         Start     Ordered   09/12/19 1541  Full code  Continuous     09/12/19 1542        Code Status History    Date Active Date Inactive Code Status Order ID Comments User Context   04/07/2019 1210 04/11/2019 2052 Full Code [id number]  Para Skeans, MD ED   06/22/2018 1457 06/24/2018  1553 Full Code [id number]  Vaughan Basta, MD Inpatient   03/03/2017 1541 03/04/2017 1545 Full Code [id number]  Demetrios Loll, MD Inpatient   10/03/2016 1535 10/05/2016 1535 Full Code [id number]  [name], MD Inpatient   10/12/2014 0953 10/14/2014 1514 Full Code [id number]  Gladstone Lighter, MD Inpatient   Advance Care Planning Activity     Family Communication: Spoke with the patient's friend Farrel Gobble yesterday Disposition Plan: Hopeful disposition to acute inpatient rehab on Monday pending insurance authorization.  The patient does live home alone and because of his poor vision and inability to drive anymore I do not think this is a safe disposition.  Consultants:  Neurology  Ophthalmology  Time spent: 26 minutes  [place]

## 2019-09-16 NOTE — Progress Notes (Signed)
Physical Therapy Treatment Patient Details Name: [name] MRN: [id number] DOB: 03/02/1951 Today's Date: 09/16/2019    History of Present Illness Pt admitted for HTN with positive R temporal/occipital lobe CVA. PMH includes prior CVA, DVT, Afib, DM, and HTN. Pt with complaints of dizziness, HA, and speech difficulties.     PT Comments    Pt with urine spilled in bed.  Stated he tried to use urinal but spilled it in the bed attributed to vision and coordination.  To EOB with rail and min guard.  He stands to [facility] with min guard from low surface and takes several very short shuffling guarded steps to doorway.  Pt given RW to trial and has significantly improved gait quality with RW vs SPC.  Pt reports only using SPC on occasion at home.  While gait is improved with RW, visual deficits continue to limit safety with gait as pt has poor scanning/compensation strategies and runs into objects in hallway needing cues to avoid and correct.  He reports feeling overall more comfortable with RW at this time.   He is able to participate in standing balance and strengthening activities in the hallway.  Lunch tray arrives and he requires assist to set up tray and open containers, cut up food but is able to feed himself without assist.  OT needs also support CIR admission. Discussed with MD regarding progress and continued recommendation for CIR.  Pt is highly motivated to participate in session and has good tolerance.  He does not feel ready for discharge home and wishes to continue with CIR admission if available to him.  He is making steady progress but would benefit from CIR to address current functional, balance, gait and safety deficits.  He is aware of deficits but will benefit from continued education, mobility skills and compensation strategy education to make for a safe and successful discharge home.   Follow Up Recommendations  CIR     Equipment Recommendations  Rolling walker with 5" wheels     Recommendations for Other Services       Precautions / Restrictions Precautions Precautions: Fall Restrictions Weight Bearing Restrictions: No    Mobility  Bed Mobility Overal bed mobility: Needs Assistance Bed Mobility: Supine to Sit     Supine to sit: Supervision        Transfers Overall transfer level: Needs assistance Equipment used: Straight cane;Rolling walker (2 wheeled) Transfers: Sit to/from Stand Sit to Stand: Min guard;Min assist         General transfer comment: verbal cues for hand placements  Ambulation/Gait Ambulation/Gait assistance: Min assist Gait Distance (Feet): 200 Feet Assistive device: Rolling walker (2 wheeled) Gait Pattern/deviations: Step-through pattern;Decreased step length - right Gait velocity: decreased   General Gait Details: trial of SPC and RW - PT feels more comfortable with RW with increased speed and confidence   Stairs             Wheelchair Mobility    Modified Rankin (Stroke Patients Only)       Balance Overall balance assessment: Needs assistance Sitting-balance support: Feet supported Sitting balance-Leahy Scale: Good Sitting balance - Comments: no LOB in sitting   Standing balance support: Single extremity supported Standing balance-Leahy Scale: Fair Standing balance comment: pt unsteady with dynamic balance/gait activities                            Cognition Arousal/Alertness: Awake/alert Behavior During Therapy: WFL for tasks assessed/performed Overall Cognitive  Status: Within Functional Limits for tasks assessed                                        Exercises Other Exercises Other Exercises: balance activities in hallway and BLE ex including SLR, marches, sidestepping and heel raises x 10    General Comments        Pertinent Vitals/Pain Pain Assessment: 0-10 Pain Score: 3  Pain Location: headache Pain Descriptors / Indicators: Headache Pain  Intervention(s): Monitored during session    Home Living                      Prior Function            PT Goals (current goals can now be found in the care plan section) Progress towards PT goals: Progressing toward goals    Frequency    7X/week      PT Plan Current plan remains appropriate    Co-evaluation              AM-PAC PT "6 Clicks" Mobility   Outcome Measure  Help needed turning from your back to your side while in a flat bed without using bedrails?: None Help needed moving from lying on your back to sitting on the side of a flat bed without using bedrails?: A Little Help needed moving to and from a bed to a chair (including a wheelchair)?: A Little Help needed standing up from a chair using your arms (e.g., wheelchair or bedside chair)?: A Little Help needed to walk in hospital room?: A Little Help needed climbing 3-5 steps with a railing? : A Little 6 Click Score: 19    End of Session Equipment Utilized During Treatment: Gait belt Activity Tolerance: Patient tolerated treatment well Patient left: in chair;with chair alarm set;with call bell/phone within reach Nurse Communication: Mobility status       Time: 7414-2395 PT Time Calculation (min) (ACUTE ONLY): 29 min  Charges:  $Gait Training: 8-22 mins $Therapeutic Exercise: 8-22 mins                    Chesley Noon, PTA 09/16/19, 12:04 PM

## 2019-09-16 NOTE — Progress Notes (Signed)
Verified d/c order of sliding scale insulin with MD. Per MD: d/c blood glucose check via finger stick.

## 2019-09-17 LAB — CBC
HCT: 41.9 % (ref 39.0–52.0)
Hemoglobin: 13.6 g/dL (ref 13.0–17.0)
MCH: 25.6 pg — ABNORMAL LOW (ref 26.0–34.0)
MCHC: 32.5 g/dL (ref 30.0–36.0)
MCV: 78.8 fL — ABNORMAL LOW (ref 80.0–100.0)
Platelets: 161 10*3/uL (ref 150–400)
RBC: 5.32 MIL/uL (ref 4.22–5.81)
RDW: 17.7 % — ABNORMAL HIGH (ref 11.5–15.5)
WBC: 2.6 10*3/uL — ABNORMAL LOW (ref 4.0–10.5)
nRBC: 0 % (ref 0.0–0.2)

## 2019-09-17 LAB — GLUCOSE, CAPILLARY
Glucose-Capillary: 82 mg/dL (ref 70–99)
Glucose-Capillary: 90 mg/dL (ref 70–99)

## 2019-09-17 MED ORDER — HYDRALAZINE HCL 10 MG PO TABS
10.0000 mg | ORAL_TABLET | Freq: Three times a day (TID) | ORAL | Status: DC
  Filled 2019-09-17: qty 1

## 2019-09-17 MED ORDER — AMLODIPINE BESYLATE 5 MG PO TABS
5.0000 mg | ORAL_TABLET | Freq: Every day | ORAL | Status: DC
  Administered 2019-09-17 – 2019-09-18 (×2): 5 mg via ORAL
  Filled 2019-09-17: qty 1

## 2019-09-17 NOTE — Progress Notes (Signed)
Physical Therapy Treatment Patient Details Name: [name] MRN: [id number] DOB: Nov 09, 1950 Today's Date: 09/17/2019    History of Present Illness Pt  is a 69 y.o. male who was admitted to [facility] for HTN with positive R temporal/occipital lobe CVA. PMH includes prior CVA, DVT, Afib, DM, and HTN. Pt with complaints of dizziness, HA, and speech difficulties.    PT Comments    Ready for session.  Bed mobility with rail but without assist.  Stands to RW and is able to walk x 1 lap around unit with RW and min guard.  He continues to run into objects with walker typically on R side.  Cues and education provided but it remains a fall hazard for pt.  He reports feeling of increased dizziness today.  Returned to room and BP 127/82 P 74 after gait. Pt unable to see BP monitor which was just off to the right of him and I needed to put in in front of him to see it when he asked what his BP was.  He continues to show poor compensatory strategies for decreased vision with gait and tasks in room.  Reports feeling "swimmy headed" vs feeling as he will pass out.  Participated in exercises as described below. Berg Balance test was performed with 40/56 score.  See details below.  Score supports RW at all times to prevent fall.  He did not use an AD upon admission (only used cane on occasion).  Given continued balance deficits, decreased safety awareness due to vision disturbances and need for continued PT and OT services, CIR remains appropriate if available upon discharge.     Follow Up Recommendations  CIR     Equipment Recommendations  Rolling walker with 5" wheels    Recommendations for Other Services       Precautions / Restrictions Precautions Precautions: Fall Restrictions Weight Bearing Restrictions: No    Mobility  Bed Mobility Overal bed mobility: Modified Independent Bed Mobility: Supine to Sit;Sit to Supine     Supine to sit: Modified independent (Device/Increase time) Sit to supine:  Modified independent (Device/Increase time)      Transfers Overall transfer level: Needs assistance Equipment used: Rolling walker (2 wheeled) Transfers: Sit to/from Stand Sit to Stand: Min guard;Min assist         General transfer comment: verbal cues for hand placements, some difficulty from lower bed surfaces  Ambulation/Gait Ambulation/Gait assistance: Min assist Gait Distance (Feet): 160 Feet Assistive device: Rolling walker (2 wheeled) Gait Pattern/deviations: Step-through pattern;Decreased step length - left;Decreased step length - right;Decreased stance time - left Gait velocity: decreased   General Gait Details: continues to run inot objects in hallway with RW increasing fall risk.  limited today due to increased dizziness.   Stairs             Wheelchair Mobility    Modified Rankin (Stroke Patients Only)       Balance Overall balance assessment: Needs assistance Sitting-balance support: Feet supported Sitting balance-Leahy Scale: Good     Standing balance support: Bilateral upper extremity supported Standing balance-Leahy Scale: Fair Standing balance comment: hesitant with SLS activities on LLE despite BUE handhold on walker                 Standardized Balance Assessment Standardized Balance Assessment : Berg Balance Test Berg Balance Test Sit to Stand: Able to stand without using hands and stabilize independently Standing Unsupported: Able to stand safely 2 minutes Sitting with Back Unsupported but Feet Supported on  Floor or Stool: Able to sit safely and securely 2 minutes Stand to Sit: Controls descent by using hands Transfers: Able to transfer safely, definite need of hands Standing Unsupported with Eyes Closed: Able to stand 10 seconds safely Standing Ubsupported with Feet Together: Able to place feet together independently and stand 1 minute safely From Standing, Reach Forward with Outstretched Arm: Can reach forward >12 cm safely  (5") From Standing Position, Pick up Object from Floor: Able to pick up shoe, needs supervision From Standing Position, Turn to Look Behind Over each Shoulder: Looks behind one side only/other side shows less weight shift Turn 360 Degrees: Needs close supervision or verbal cueing Standing Unsupported, Alternately Place Feet on Step/Stool: Able to complete >2 steps/needs minimal assist Standing Unsupported, One Foot in Front: Able to take small step independently and hold 30 seconds Standing on One Leg: Tries to lift leg/unable to hold 3 seconds but remains standing independently Total Score: 40        Cognition Arousal/Alertness: Awake/alert Behavior During Therapy: WFL for tasks assessed/performed Overall Cognitive Status: Within Functional Limits for tasks assessed                                        Exercises Other Exercises Other Exercises: standing ex with walker for support - heel raises, SLR and marches 2 x 10    General Comments        Pertinent Vitals/Pain Pain Assessment: No/denies pain Pain Location: headache    Home Living                      Prior Function            PT Goals (current goals can now be found in the care plan section) Acute Rehab PT Goals Patient Stated Goal: I want to be able to do what I use to do Progress towards PT goals: Progressing toward goals    Frequency    7X/week      PT Plan Current plan remains appropriate    Co-evaluation              AM-PAC PT "6 Clicks" Mobility   Outcome Measure  Help needed turning from your back to your side while in a flat bed without using bedrails?: None Help needed moving from lying on your back to sitting on the side of a flat bed without using bedrails?: A Little Help needed moving to and from a bed to a chair (including a wheelchair)?: A Little Help needed standing up from a chair using your arms (e.g., wheelchair or bedside chair)?: A Little Help  needed to walk in hospital room?: A Little Help needed climbing 3-5 steps with a railing? : A Little 6 Click Score: 19    End of Session Equipment Utilized During Treatment: Gait belt Activity Tolerance: Patient tolerated treatment well;Treatment limited secondary to medical complications (Comment)(increased dizziness today.  BP 127/82 P 74) Patient left: in bed;with call bell/phone within reach;with bed alarm set Nurse Communication: Mobility status       Time: 0347-4259 PT Time Calculation (min) (ACUTE ONLY): 28 min  Charges:  $Gait Training: 8-22 mins $Therapeutic Exercise: 8-22 mins                    Chesley Noon, PTA 09/17/19, 10:41 AM

## 2019-09-17 NOTE — Care Management Important Message (Signed)
Important Message  Patient Details  Name: [name] MRN: [id number] Date of Birth: [birth year]   Medicare Important Message Given:  Yes     [name] A Nikkita Adeyemi 09/17/2019, 11:15 AM

## 2019-09-17 NOTE — TOC Progression Note (Signed)
Transition of Care [facility]) - Progression Note    Patient Details  Name: [name] MRN: [id number] Date of Birth: 21-Jan-1951  Transition of [facility]) CM/SW [place], RN Phone Number: 09/17/2019, 9:00 AM  Clinical Narrative:    Moshe Salisbury liason with CIR to inquire on insurance auth, It is still pending I left my contact information for a call with Updates   Expected Discharge Plan: [place] Barriers to Discharge: Continued Medical Work up  Expected Discharge Plan and Services Expected Discharge Plan: [place] In-house Referral: Clinical Social Work   Post Acute Care Choice: IP Rehab Living arrangements for the past 2 months: Apartment                                       Social Determinants of Health (SDOH) Interventions    Readmission Risk Interventions No flowsheet data found.

## 2019-09-17 NOTE — Evaluation (Signed)
Occupational Therapy Evaluation Patient Details Name: [name] MRN: [id number] DOB: Aug 20, 1950 Today's Date: 09/17/2019    History of Present Illness Pt  is a 69 y.o. male who was admitted to [facility] for HTN with positive R temporal/occipital lobe CVA. PMH includes prior CVA, DVT, Afib, DM, and HTN. Pt with complaints of dizziness, HA, and speech difficulties.   Clinical Impression   Pt. education was provided about visual compensatory strategies for meal tray set-up, room set-up and accessing the remote, and nurses call button. Pt. Education was provided about left sided awareness, and visual scanning to the left. Pt. continues to benefit from OT services for ADL training, A/E training, and pt. education about visual compensatory strategies, home modification, and DME. Discharge disposition remains appropriate.    Follow Up Recommendations  CIR    Equipment Recommendations  None recommended by OT    Recommendations for Other Services       Precautions / Restrictions        Mobility Bed Mobility Overal bed mobility: Needs Assistance       Supine to sit: Supervision        Transfers   Equipment used: Straight cane;Rolling walker (2 wheeled)   Sit to Stand: Min guard;Min assist              Balance                                     ADL either performed or assessed with clinical judgement   ADL Overall ADL's : Needs assistance/impaired Eating/Feeding: Sitting;Set up;Minimal assistance Eating/Feeding Details (indicate cue type and reason): Visual cues                                         Vision Patient Visual Report: Blurring of vision Vision Assessment?: Yes;Vision impaired- to be further tested in functional context Visual Fields: Left visual field deficit(Pt. reports only being able to see the right side of the television.)     Perception     Praxis      Pertinent Vitals/Pain Pain Assessment: No/denies  pain Pain Location: headache     Hand Dominance     Extremity/Trunk Assessment             Communication     Cognition Arousal/Alertness: Awake/alert Behavior During Therapy: WFL for tasks assessed/performed Overall Cognitive Status: Within Functional Limits for tasks assessed                                     General Comments       Exercises     Shoulder Instructions      Home Living                                          Prior Functioning/Environment                   OT Problem List: Impaired vision/perception;Impaired balance (sitting and/or standing);Decreased cognition      OT Treatment/Interventions: Self-care/ADL training;Therapeutic exercise;Therapeutic activities;Neuromuscular education;DME and/or AE instruction;Visual/perceptual remediation/compensation;Patient/family education;Balance training    OT Goals(Current goals can be found in the care  plan section) Acute Rehab OT Goals Patient Stated Goal: I want to be able to do what I use to do OT Goal Formulation: With patient/family Time For Goal Achievement: 09/27/19 Potential to Achieve Goals: Good  OT Frequency: Min 3X/week   Barriers to D/C:            Co-evaluation              AM-PAC OT "6 Clicks" Daily Activity     Outcome Measure Help from another person eating meals?: A Little Help from another person taking care of personal grooming?: A Little Help from another person toileting, which includes using toliet, bedpan, or urinal?: A Little Help from another person bathing (including washing, rinsing, drying)?: A Little Help from another person to put on and taking off regular upper body clothing?: A Little Help from another person to put on and taking off regular lower body clothing?: A Little 6 Click Score: 18   End of Session Equipment Utilized During Treatment: Gait belt  Activity Tolerance: Patient tolerated treatment well Patient  left: in bed;with call bell/phone within reach;with bed alarm set  OT Visit Diagnosis: Other abnormalities of gait and mobility (R26.89);Low vision, both eyes (H54.2)                Time: 2080-2233 OT Time Calculation (min): 17 min Charges:  OT General Charges $OT Visit: 1 Visit OT Treatments $Self Care/Home Management : 8-22 mins  Harrel Carina, MS, OTR/L Harrel Carina 09/17/2019, 10:16 AM

## 2019-09-17 NOTE — TOC Progression Note (Signed)
Transition of Care [facility]) - Progression Note    Patient Details  Name: [name] MRN: [id number] Date of Birth: [birth year]  Transition of Care [facility]) CM/SW [place], RN Phone Number: 09/17/2019, 3:14 PM  Clinical Narrative:     [name] and spoke to [facility] [place] for [employer] about insurance auth, still Pending.   Expected Discharge Plan: IP Rehab Facility Barriers to Discharge: Continued Medical Work up  Expected Discharge Plan and Services Expected Discharge Plan: [place] In-house Referral: Clinical Social Work   Post Acute Care Choice: IP Rehab Living arrangements for the past 2 months: Apartment                                       Social Determinants of Health (SDOH) Interventions    Readmission Risk Interventions No flowsheet data found.

## 2019-09-17 NOTE — Plan of Care (Signed)
  Problem: Education: Goal: Knowledge of General Education information will improve Description: Including pain rating scale, medication(s)/side effects and non-pharmacologic comfort measures Outcome: Progressing   Problem: Health Behavior/Discharge Planning: Goal: Ability to manage health-related needs will improve Outcome: Progressing   Problem: Clinical Measurements: Goal: Will remain free from infection Outcome: Progressing   Problem: Coping: Goal: Level of anxiety will decrease Outcome: Progressing

## 2019-09-17 NOTE — PMR Pre-admission (Signed)
PMR Admission Coordinator Pre-Admission Assessment  Patient: [name] is an 69 y.o., male MRN: [id number] DOB: August 22, 1950 Height: 6' 2"  (188 cm) Weight: 97.1 kg  Insurance Information HMO: yes    PPO:      PCP:      IPA:      80/20:      OTHER:  PRIMARY: U[employer] Medicare      Policy#: [id number]      Subscriber: pt  CM Name: Kennyth Lose      Phone#: [phone]     Fax#: [phone] Pre-Cert#: [id number] Josem Kaufmann for CIR provided by Kennyth Lose at [place] with updates due to fax listed above on 5/10      Employer: n/a Benefits:  Phone #: [phone]     Name:  Eff. Date: 05/18/19     Deduct: $0      Out of Pocket Max: $3600 (met $150.53)      Life Max: n/a CIR: $295/day for days 1-5      SNF: 20 full days Outpatient:      Co-Pay: $30/visit Home Health: 100%      Co-Pay:  DME: 80%     Co-Pay: 20% Providers:  SECONDARY:       Policy#:      Phone#:   Development worker, community:       Phone#:   The Therapist, art Information Summary" for patients in Inpatient Rehabilitation Facilities with attached "Privacy Act [place] Records" was provided and verbally reviewed with: Patient and Family  Emergency Contact Information Contact Information    Name Relation Home Work Mobile   [place] [phone]  [place], Lia Foyer   [phone]      Current Medical History  Patient Admitting Diagnosis: L PCA CVA   History of Present Illness: Pt is a 69 y/o male with PMH of CVA, DVT, afib, DM, and HTN admitted with dizziness and HA for a few hours.  Also reported difficulty speaking x1 week but did not seek medical intention for that, as well as blurry vision and difficulty walking.  CT angio showed bilateral vertebral artery stenosis, MRI showed acute CVA in R temporal lobe and R occipital lobe in the PCA.  Pt already on Eliquis and tele-neurology recommended continue high dose atorvastatin.  Pt trending hypotensive and BP medications adjusted.  Pt with tachycardia  with ambulation but bradycardia with pauses overnight.  Cardiology consult felt due to possible sleep apnea.  Therapy evaluations were completed and pt was recommended for CIR to maximize independence prior to returning home.   Complete NIHSS TOTAL: 5  Patient's medical record from [facility] has been reviewed by the rehabilitation admission coordinator and physician.  Past Medical History  Past Medical History:  Diagnosis Date  . Arthritis   . Atrial fibrillation ([place])   . Cocaine abuse ([place])    + UDS on admission  . Diabetes mellitus without complication ([place])   . Gout current and history of  . History of deep vein thrombosis 2006  . History of tobacco abuse    has smoked for 50 years  . Hypertension   . Pulmonary embolism ([place]) 2010  . Stroke [facility]) 2017   affected speech and right side-uses cane    Family History   family history includes Aneurysm in his mother; Diabetes in his father.  Prior Rehab/Hospitalizations Has the patient had prior rehab or hospitalizations prior to admission? Yes  Has the patient had major surgery during 100 days prior to admission? No   Current  Medications  Current Facility-Administered Medications:  .  acetaminophen (TYLENOL) tablet 650 mg, 650 mg, Oral, Q4H PRN, 650 mg at 09/17/19 0627 **OR** acetaminophen (TYLENOL) 160 MG/5ML solution 650 mg, 650 mg, Per Tube, Q4H PRN **OR** acetaminophen (TYLENOL) suppository 650 mg, 650 mg, Rectal, Q4H PRN, Wieting, Richard, MD .  allopurinol (ZYLOPRIM) tablet 300 mg, 300 mg, Oral, Daily, Leslye Peer, Richard, MD, 300 mg at 09/18/19 0857 .  amLODipine (NORVASC) tablet 5 mg, 5 mg, Oral, Daily, Leslye Peer, Richard, MD, 5 mg at 09/18/19 0856 .  apixaban (ELIQUIS) tablet 5 mg, 5 mg, Oral, BID, Leslye Peer, Richard, MD, 5 mg at 09/18/19 0855 .  atorvastatin (LIPITOR) tablet 80 mg, 80 mg, Oral, QPM, Loletha [name], MD, 80 mg at 09/17/19 1802 .  cyclobenzaprine (FLEXERIL) tablet 5 mg, 5 mg, Oral, TID PRN, Loletha [name], MD, 5  mg at 09/17/19 2146 .  gabapentin (NEURONTIN) capsule 300 mg, 300 mg, Oral, TID, Leslye Peer, Richard, MD, 300 mg at 09/18/19 0857 .  lidocaine (LIDODERM) 5 % 1 patch, 1 patch, Transdermal, Q12H, Loletha [name], MD, 1 patch at 09/18/19 0859 .  metoprolol tartrate (LOPRESSOR) tablet 12.5 mg, 12.5 mg, Oral, BID, Teodoro Spray, MD, 12.5 mg at 09/18/19 0855 .  nicotine (NICODERM CQ - dosed in mg/24 hr) patch 7 mg, 7 mg, Transdermal, Daily, Leslye Peer, Richard, MD, 7 mg at 09/18/19 0858 .  polyethylene glycol (MIRALAX / GLYCOLAX) packet 17 g, 17 g, Oral, Daily PRN, Wieting, Richard, MD .  potassium chloride SA (KLOR-CON) CR tablet 40 mEq, 40 mEq, Oral, BID, Loletha [name], MD, 40 mEq at 09/18/19 0855 .  traMADol (ULTRAM) tablet 50 mg, 50 mg, Oral, Q6H PRN, Loletha [name], MD, 50 mg at 09/17/19 2146  Patients Current Diet:  Diet Order            Diet heart healthy/carb modified Room service appropriate? Yes; Fluid consistency: Thin  Diet effective now              Precautions / Restrictions Precautions Precautions: Fall Restrictions Weight Bearing Restrictions: No   Has the patient had 2 or more falls or a fall with injury in the past year? Yes  Prior Activity Level Community (5-7x/wk): independent (reportedly still driving with L hemonymous hemianopsia? from previous R PCA CVA), no DME used prior to admission  Prior Functional Level Self Care: Did the patient need help bathing, dressing, using the toilet or eating? Independent  Indoor Mobility: Did the patient need assistance with walking from room to room (with or without device)? Independent  Stairs: Did the patient need assistance with internal or external stairs (with or without device)? Independent  Functional Cognition: Did the patient need help planning regular tasks such as shopping or remembering to take medications? [place] Devices/Equipment: Cane (specify quad or  straight) Home Equipment: Cane - single point, Grab bars - tub/shower  Prior Device Use: Indicate devices/aids used by the patient prior to current illness, exacerbation or injury? cane  Current Functional Level Cognition  Overall Cognitive Status: Within Functional Limits for tasks assessed Orientation Level: Oriented X4 General Comments: pt is A and O but does require incraesed time to process/dysarthria    Extremity Assessment (includes Sensation/Coordination)  Upper Extremity Assessment: Generalized weakness(R UE grossly 4/5; L UE grossly 5/5, denies sensory deficits, FMC WNL)  Lower Extremity Assessment: Overall WFL for tasks assessed    ADLs  Overall ADL's : Needs assistance/impaired Eating/Feeding: Sitting, Set up, Minimal assistance Eating/Feeding Details (indicate cue  type and reason): Visual cues Upper Body Bathing: Sitting, Set up, Supervision/ safety Lower Body Bathing: Min guard, Set up, Sit to/from stand Upper Body Dressing : Sitting, Set up, Supervision/safety Lower Body Dressing: Sit to/from stand, Min guard, Set up Toilet Transfer: Ambulation, Min guard General ADL Comments: Pt seen for LB dressing skills and standing to use urinal and visually navigate ambulation in room followed by vision exercises.  He has decreased convergence in R eye and presents with homonymous hemianopsia and needs cues to learn how to compensate for this.  He needs extra time to communicate his thoughts and get words out with good articulation.  Difficulty with [facility] tasks including opening containers/packets requiring assist but able to use urinal with only SBA for balance but did not communicate needs appropriately since he said he would use urinal sitting but then proceeded to stand up. Pt stated he has less dizziness but currently had a headache. Pt instructed in continued visual compensatory strategies to improve safe access to environment, particularly table top items for meals, grooming, and  IADL such as reading tasks. Pt demo'd difficulty with identifying letter, often confusing similarly shaped letters but with cues for visual rest breaks and using fingers to help pt visually scan L to R. Pt reports that sometimes he is unable to see something he is looking at but with brief visual rest breaks (with initial instruction) pt reports he sometimes is then able to see what he missed. Pt continues to demonstrate excellent motivation and good progress towards goals. Continues to be great candidate for CIR upon discharge to support pt's return to modified independent level or better in order to minimize falls, functional decline, and increased caregiver burden.    Mobility  Overal bed mobility: Modified Independent Bed Mobility: Supine to Sit, Sit to Supine Supine to sit: Modified independent (Device/Increase time) Sit to supine: Modified independent (Device/Increase time) General bed mobility comments: Pt was able to exit L side of bed with supervision however HOB was elevated, bed rails used, and increased time to perform. Pt continues to have coordination deficits present.    Transfers  Overall transfer level: Needs assistance Equipment used: Rolling walker (2 wheeled) Transfers: Sit to/from Stand Sit to Stand: Min guard, Min assist General transfer comment: verbal cues for hand placements, some difficulty from lower bed surfaces    Ambulation / Gait / Stairs / Wheelchair Mobility  Ambulation/Gait Ambulation/Gait assistance: Herbalist (Feet): 160 Feet Assistive device: Rolling walker (2 wheeled) Gait Pattern/deviations: Step-through pattern, Decreased step length - left, Decreased step length - right, Decreased stance time - left General Gait Details: continues to run inot objects in hallway with RW increasing fall risk.  limited today due to increased dizziness. Gait velocity: decreased    Posture / Balance Dynamic Sitting Balance Sitting balance - Comments: no LOB  in sitting Balance Overall balance assessment: Needs assistance Sitting-balance support: Feet supported Sitting balance-Leahy Scale: Good Sitting balance - Comments: no LOB in sitting Standing balance support: Bilateral upper extremity supported Standing balance-Leahy Scale: Fair Standing balance comment: hesitant with SLS activities on LLE despite BUE handhold on walker Standardized Balance Assessment Standardized Balance Assessment : Berg Balance Test Berg Balance Test Sit to Stand: Able to stand without using hands and stabilize independently Standing Unsupported: Able to stand safely 2 minutes Sitting with Back Unsupported but Feet Supported on Floor or Stool: Able to sit safely and securely 2 minutes Stand to Sit: Controls descent by using hands Transfers: Able to transfer  safely, definite need of hands Standing Unsupported with Eyes Closed: Able to stand 10 seconds safely Standing Ubsupported with Feet Together: Able to place feet together independently and stand 1 minute safely From Standing, Reach Forward with Outstretched Arm: Can reach forward >12 cm safely (5") From Standing Position, Pick up Object from Floor: Able to pick up shoe, needs supervision From Standing Position, Turn to Look Behind Over each Shoulder: Looks behind one side only/other side shows less weight shift Turn 360 Degrees: Needs close supervision or verbal cueing Standing Unsupported, Alternately Place Feet on Step/Stool: Able to complete >2 steps/needs minimal assist Standing Unsupported, One Foot in Front: Able to take small step independently and hold 30 seconds Standing on One Leg: Tries to lift leg/unable to hold 3 seconds but remains standing independently Total Score: 40    Special needs/care consideration Diabetic management yes, Special service needs bilateral hemianopsia and Designated visitor Vincent (from acute therapy documentation) Living Arrangements:  Alone  Lives With: Alone Available Help at Discharge: Friend(s), Available PRN/intermittently Type of Home: Apartment Home Layout: One level Home Access: Level entry Bathroom Shower/Tub: Chiropodist: [place]: No  Discharge Living Setting Plans for Discharge Living Setting: Patient's home Type of Home at Discharge: Apartment Discharge Home Layout: One level Discharge Home Access: Level entry Discharge Bathroom Shower/Tub: Tub/shower unit Discharge Bathroom Toilet: Standard Discharge Bathroom Accessibility: Yes How Accessible: Accessible via walker Does the patient have any problems obtaining your medications?: No  Social/Family/Support Systems Anticipated Caregiver: friend, Karna Dupes Anticipated Caregiver's Contact Information: [phone] Ability/Limitations of Caregiver: supervision to min guard only Caregiver Availability: 24/7 Discharge Plan Discussed with Primary Caregiver: Yes(confirmed by Ovidio Kin (CSW)) Is Caregiver In Agreement with Plan?: Yes Does Caregiver/Family have Issues with Lodging/Transportation while Pt is in Rehab?: No  Goals Patient/Family Goal for Rehab: PT/OT mod I to min guard, SLP n/a Expected length of stay: 6-9 days Pt/Family Agrees to Admission and willing to participate: Yes Program Orientation Provided & Reviewed with Pt/Caregiver Including Roles  & Responsibilities: Yes  Barriers to Discharge: Insurance for SNF coverage, Other (comments)(new visual deficits)  Decrease burden of Care through IP rehab admission: n/a  Possible need for SNF placement upon discharge: Not anticipated  Patient Condition: I have reviewed medical records from [facility], spoken with CM, and patient. I discussed via phone for inpatient rehabilitation assessment.  Patient will benefit from ongoing PT and OT, can actively participate in 3 hours of therapy a day 5 days of the week, and can make measurable gains during the  admission.  Patient will also benefit from the coordinated team approach during an Inpatient Acute Rehabilitation admission.  The patient will receive intensive therapy as well as Rehabilitation physician, nursing, social worker, and care management interventions.  Due to safety, disease management and patient education the patient requires 24 hour a day rehabilitation nursing.  The patient is currently min guard with mobility and basic ADLs.  Discharge setting and therapy post discharge at home with home health is anticipated.  Patient has agreed to participate in the Acute Inpatient Rehabilitation Program and will admit today.  Preadmission Screen Completed By:  Michel Santee, PT, DPT 09/18/2019 9:50 AM ______________________________________________________________________   Discussed status with Dr. Dagoberto Ligas on 09/18/19  at 9:50 AM  and received approval for admission today.  Admission Coordinator:  Michel Santee, PT, DPT time 9:50 AM Sudie Grumbling 09/18/19    Assessment/Plan: Diagnosis: 1. Does the need for close, 24  hr/day Medical supervision in concert with the patient's rehab needs make it unreasonable for this patient to be served in a less intensive setting? Yes 2. Co-Morbidities requiring supervision/potential complications: Afib, cocaine, DM, DVT, HTN 3. Due to bowel management, safety, skin/wound care, disease management, medication administration, pain management and patient education, does the patient require 24 hr/day rehab nursing? Yes 4. Does the patient require coordinated care of a physician, rehab nurse, PT, OT, and SLP to address physical and functional deficits in the context of the above medical diagnosis(es)? Yes Addressing deficits in the following areas: balance, endurance, locomotion, strength, transferring, bathing, dressing, feeding, grooming and toileting 5. Can the patient actively participate in an intensive therapy program of at least 3 hrs of therapy 5 days a week?  Yes 6. The potential for patient to make measurable gains while on inpatient rehab is good and fair 7. Anticipated functional outcomes upon discharge from inpatient rehab: modified independent and supervision PT, modified independent, supervision and min assist OT, n/a SLP 8. Estimated rehab length of stay to reach the above functional goals is: 6-9 days 9. Anticipated discharge destination: Home 10. Overall Rehab/Functional Prognosis: good and fair   MD Signature:

## 2019-09-17 NOTE — Progress Notes (Signed)
Patient ID: [name], male   DOB: 1951-01-31, 69 y.o.   MRN: [id number] [facility] PROGRESS NOTE  Darryle Dennie ZGY:[id number] DOB: 10-Nov-1950 DOA: 09/12/2019 PCP: Theotis Burrow, MD  HPI/Subjective: Patient still has blurred vision.  Feels a little bit stronger.  Still off balance.  Eating well.  No chest pain or shortness of breath.  Objective: Vitals:   09/17/19 1006 09/17/19 1011  BP: 127/82   Pulse: 74 74  Resp:    Temp:    SpO2:      Intake/Output Summary (Last 24 hours) at 09/17/2019 1528 Last data filed at 09/17/2019 1414 Gross per 24 hour  Intake 360 ml  Output 1225 ml  Net -865 ml   Filed Weights   09/12/19 1359  Weight: 97.1 kg    ROS: Review of Systems  Constitutional: Negative for fever.  Eyes: Positive for blurred vision. Negative for pain.  Respiratory: Negative for cough and shortness of breath.   Cardiovascular: Negative for chest pain.  Gastrointestinal: Negative for abdominal pain, nausea and vomiting.  Genitourinary: Negative for dysuria.  Musculoskeletal: Negative for joint pain.  Neurological: Negative for dizziness.   Exam: Physical Exam  HENT:  Nose: No mucosal edema.  Mouth/Throat: No oropharyngeal exudate or posterior oropharyngeal edema.  Eyes: Pupils are equal, round, and reactive to light. Conjunctivae and lids are normal.  Right eye slightly deviated out  Neck: Carotid bruit is not present.  Cardiovascular: S1 normal and S2 normal. An irregularly irregular rhythm present. Exam reveals no gallop.  No murmur heard. Respiratory: No respiratory distress. He has no wheezes. He has no rhonchi. He has no rales.  GI: Soft. Bowel sounds are normal. There is no abdominal tenderness.  Musculoskeletal:     Right ankle: Swelling present.     Left ankle: Swelling present.  Lymphadenopathy:    He has no cervical adenopathy.  Neurological: He is alert.  Still with trouble seeing fingers in front of his face with both eyes.   Skin: Skin is warm. No rash noted. Nails show no clubbing.  Psychiatric: He has a normal mood and affect.      Data Reviewed: Basic Metabolic Panel: Recent Labs  Lab 09/12/19 1420 09/13/19 0524 09/14/19 0627  NA 142 139 136  K 3.2* 3.2* 3.9  CL 106 107 101  CO2 28 27 27   GLUCOSE 155* 95 102*  BUN 16 15 13   CREATININE 0.93 0.95 0.97  CALCIUM 8.8* 8.6* 9.0  MG  --   --  1.9   Liver Function Tests: Recent Labs  Lab 09/12/19 1420  AST 24  ALT 17  ALKPHOS 72  BILITOT 0.7  PROT 6.6  ALBUMIN 3.6   CBC: Recent Labs  Lab 09/12/19 1420 09/17/19 0253  WBC 3.0* 2.6*  NEUTROABS 1.6*  --   HGB 11.7* 13.6  HCT 37.9* 41.9  MCV 81.5 78.8*  PLT 152 161   BNP (last 3 results) Recent Labs    04/08/19 0848  BNP 298.0*     CBG: Recent Labs  Lab 09/16/19 1158 09/16/19 1722 09/16/19 2035 09/17/19 0735 09/17/19 1141  GLUCAP 78 128* 100* 82 90    Recent Results (from the past 240 hour(s))  Respiratory Panel by RT PCR (Flu A&B, Covid) - Nasopharyngeal Swab     Status: None   Collection Time: 09/12/19  3:20 PM   Specimen: Nasopharyngeal Swab  Result Value Ref Range Status   SARS Coronavirus 2 by RT PCR NEGATIVE NEGATIVE Final  Comment: (NOTE) SARS-CoV-2 target nucleic acids are NOT DETECTED. The SARS-CoV-2 RNA is generally detectable in upper respiratoy specimens during the acute phase of infection. The lowest concentration of SARS-CoV-2 viral copies this assay can detect is 131 copies/mL. A negative result does not preclude SARS-Cov-2 infection and should not be used as the sole basis for treatment or other patient management decisions. A negative result may occur with  improper specimen collection/handling, submission of specimen other than nasopharyngeal swab, presence of viral mutation(s) within the areas targeted by this assay, and inadequate number of viral copies (<131 copies/mL). A negative result must be combined with clinical observations, patient  history, and epidemiological information. The expected result is Negative. Fact Sheet for Patients:  PinkCheek.be Fact Sheet for Healthcare Providers:  GravelBags.it This test is not yet ap proved or cleared by the [place] FDA and  has been authorized for detection and/or diagnosis of SARS-CoV-2 by FDA under an Emergency Use Authorization (EUA). This EUA will remain  in effect (meaning this test can be used) for the duration of the COVID-19 declaration under Section 564(b)(1) of the Act, 21 U.S.C. section 360bbb-3(b)(1), unless the authorization is terminated or revoked sooner.    Influenza A by PCR NEGATIVE NEGATIVE Final   Influenza B by PCR NEGATIVE NEGATIVE Final    Comment: (NOTE) The Xpert Xpress SARS-CoV-2/FLU/RSV assay is intended as an aid in  the diagnosis of influenza from Nasopharyngeal swab specimens and  should not be used as a sole basis for treatment. Nasal washings and  aspirates are unacceptable for Xpert Xpress SARS-CoV-2/FLU/RSV  testing. Fact Sheet for Patients: PinkCheek.be Fact Sheet for Healthcare Providers: GravelBags.it This test is not yet approved or cleared by the [place] FDA and  has been authorized for detection and/or diagnosis of SARS-CoV-2 by  FDA under an Emergency Use Authorization (EUA). This EUA will remain  in effect (meaning this test can be used) for the duration of the  Covid-19 declaration under Section 564(b)(1) of the Act, 21  U.S.C. section 360bbb-3(b)(1), unless the authorization is  terminated or revoked. Performed at [facility], [address] [place], [place] [address]       Scheduled Meds: . allopurinol  300 mg Oral Daily  . amLODipine  5 mg Oral Daily  . apixaban  5 mg Oral BID  . atorvastatin  80 mg Oral QPM  . colchicine  0.6 mg Oral Daily  . furosemide  20 mg Oral Daily  .  gabapentin  400 mg Oral TID  . lidocaine  1 patch Transdermal Q12H  . losartan  100 mg Oral Daily  . metoprolol tartrate  12.5 mg Oral BID  . nicotine  7 mg Transdermal Daily  . potassium chloride  40 mEq Oral BID    Assessment/Plan:  1. 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  Patient already on Eliquis.  Continue high-dose atorvastatin.  Physical therapy recommending CIR. still awaiting insurance authorization. 2. Bilateral vision loss.  This is likely from prior stroke and current stroke.  On Eliquis for anticoagulation.  Follow-up with ophthalmology as outpatient. 3. Essential hypertension.  Blood pressure low yesterday and again this morning.  Continue low-dose metoprolol.  I discontinued hydralazine.  I decreased the dose of Norvasc to 5 mg.  Patient on low-dose Lasix and Cozaar also.  Continue to monitor blood pressure if continues to trend low will readjust medications. 4. Patient with fast heart rate with ambulation.  Bradycardia and pauses  overnight.  Cardiology thinking that he does have sleep apnea and likely the cause of these.  Cardiology okay with low-dose metoprolol 5. Chronic atrial fibrillation.  Low-dose metoprolol.  On Eliquis for anticoagulation. 6. Type 2 diabetes mellitus with hyperlipidemia.  Continue high-dose Lipitor.  Hemoglobin A1c excellent at 6.2.  Diet control for diabetes. 7. Tobacco abuse on nicotine patch 8. Few beats of nonsustained ventricular tachycardia.  Electrolytes given the other day.  Code Status:     Code Status Orders  (From admission, onward)         Start     Ordered   09/12/19 1541  Full code  Continuous     09/12/19 1542        Code Status History    Date Active Date Inactive Code Status Order ID Comments User Context   04/07/2019 1210 04/11/2019 2052 Full Code [id number]  Para Skeans, MD ED   06/22/2018 1457 06/24/2018 1553 Full Code [id number]  Vaughan Basta, MD Inpatient   03/03/2017  1541 03/04/2017 1545 Full Code [id number]  Demetrios Loll, MD Inpatient   10/03/2016 1535 10/05/2016 1535 Full Code [id number]  [name], MD Inpatient   10/12/2014 0953 10/14/2014 1514 Full Code [id number]  Gladstone Lighter, MD Inpatient   Advance Care Planning Activity     Family Communication: Spoke with the patient's friend Farrel Gobble on the phone. Disposition Plan: Awaiting insurance authorization for acute inpatient rehab.  Once bed available will discharge to acute inpatient rehab.  Patient medically stable to go to go to acute inpatient rehab.  Consultants:  Neurology  Ophthalmology  Time spent: 28 minutes, case discussed with transitional care team  [facility]  [facility]

## 2019-09-18 ENCOUNTER — Encounter: Payer: Self-pay | Admitting: Internal Medicine

## 2019-09-18 ENCOUNTER — Inpatient Hospital Stay (HOSPITAL_COMMUNITY)
Admission: RE | Admit: 2019-09-18 | Discharge: 2019-09-27 | DRG: 057 | Disposition: A | Discharge status: Home / Self Care | Payer: Medicare Other | Source: Other Acute Inpatient Hospital | Attending: Physical Medicine & Rehabilitation | Admitting: Physical Medicine & Rehabilitation

## 2019-09-18 DIAGNOSIS — Z833 Family history of diabetes mellitus: Secondary | ICD-10-CM | POA: Diagnosis not present

## 2019-09-18 DIAGNOSIS — I63532 Cerebral infarction due to unspecified occlusion or stenosis of left posterior cerebral artery: Secondary | ICD-10-CM | POA: Diagnosis not present

## 2019-09-18 DIAGNOSIS — Z7901 Long term (current) use of anticoagulants: Secondary | ICD-10-CM

## 2019-09-18 DIAGNOSIS — H538 Other visual disturbances: Secondary | ICD-10-CM | POA: Diagnosis present

## 2019-09-18 DIAGNOSIS — I69322 Dysarthria following cerebral infarction: Secondary | ICD-10-CM

## 2019-09-18 DIAGNOSIS — I878 Other specified disorders of veins: Secondary | ICD-10-CM | POA: Diagnosis present

## 2019-09-18 DIAGNOSIS — Z86711 Personal history of pulmonary embolism: Secondary | ICD-10-CM | POA: Diagnosis not present

## 2019-09-18 DIAGNOSIS — M25571 Pain in right ankle and joints of right foot: Secondary | ICD-10-CM

## 2019-09-18 DIAGNOSIS — I69351 Hemiplegia and hemiparesis following cerebral infarction affecting right dominant side: Secondary | ICD-10-CM | POA: Diagnosis not present

## 2019-09-18 DIAGNOSIS — G8929 Other chronic pain: Secondary | ICD-10-CM | POA: Diagnosis present

## 2019-09-18 DIAGNOSIS — M1A9XX1 Chronic gout, unspecified, with tophus (tophi): Secondary | ICD-10-CM | POA: Diagnosis present

## 2019-09-18 DIAGNOSIS — H53461 Homonymous bilateral field defects, right side: Secondary | ICD-10-CM | POA: Diagnosis present

## 2019-09-18 DIAGNOSIS — M109 Gout, unspecified: Secondary | ICD-10-CM | POA: Diagnosis present

## 2019-09-18 DIAGNOSIS — I10 Essential (primary) hypertension: Secondary | ICD-10-CM | POA: Diagnosis present

## 2019-09-18 DIAGNOSIS — F1411 Cocaine abuse, in remission: Secondary | ICD-10-CM | POA: Diagnosis present

## 2019-09-18 DIAGNOSIS — F17210 Nicotine dependence, cigarettes, uncomplicated: Secondary | ICD-10-CM | POA: Diagnosis present

## 2019-09-18 DIAGNOSIS — E1142 Type 2 diabetes mellitus with diabetic polyneuropathy: Secondary | ICD-10-CM

## 2019-09-18 DIAGNOSIS — M10471 Other secondary gout, right ankle and foot: Secondary | ICD-10-CM

## 2019-09-18 DIAGNOSIS — Z79899 Other long term (current) drug therapy: Secondary | ICD-10-CM | POA: Diagnosis not present

## 2019-09-18 DIAGNOSIS — E041 Nontoxic single thyroid nodule: Secondary | ICD-10-CM | POA: Diagnosis present

## 2019-09-18 DIAGNOSIS — Z86718 Personal history of other venous thrombosis and embolism: Secondary | ICD-10-CM

## 2019-09-18 DIAGNOSIS — M7122 Synovial cyst of popliteal space [Baker], left knee: Secondary | ICD-10-CM | POA: Diagnosis present

## 2019-09-18 DIAGNOSIS — I69398 Other sequelae of cerebral infarction: Principal | ICD-10-CM

## 2019-09-18 DIAGNOSIS — E119 Type 2 diabetes mellitus without complications: Secondary | ICD-10-CM | POA: Diagnosis present

## 2019-09-18 DIAGNOSIS — E876 Hypokalemia: Secondary | ICD-10-CM | POA: Diagnosis present

## 2019-09-18 DIAGNOSIS — D709 Neutropenia, unspecified: Secondary | ICD-10-CM | POA: Diagnosis present

## 2019-09-18 DIAGNOSIS — R41841 Cognitive communication deficit: Secondary | ICD-10-CM | POA: Diagnosis present

## 2019-09-18 DIAGNOSIS — H543 Unqualified visual loss, both eyes: Secondary | ICD-10-CM | POA: Diagnosis not present

## 2019-09-18 DIAGNOSIS — I69328 Other speech and language deficits following cerebral infarction: Secondary | ICD-10-CM

## 2019-09-18 DIAGNOSIS — F419 Anxiety disorder, unspecified: Secondary | ICD-10-CM | POA: Diagnosis present

## 2019-09-18 DIAGNOSIS — I69392 Facial weakness following cerebral infarction: Secondary | ICD-10-CM | POA: Diagnosis not present

## 2019-09-18 DIAGNOSIS — H53462 Homonymous bilateral field defects, left side: Secondary | ICD-10-CM | POA: Diagnosis present

## 2019-09-18 DIAGNOSIS — I63531 Cerebral infarction due to unspecified occlusion or stenosis of right posterior cerebral artery: Secondary | ICD-10-CM | POA: Diagnosis not present

## 2019-09-18 DIAGNOSIS — N179 Acute kidney failure, unspecified: Secondary | ICD-10-CM | POA: Diagnosis present

## 2019-09-18 DIAGNOSIS — I69320 Aphasia following cerebral infarction: Secondary | ICD-10-CM

## 2019-09-18 DIAGNOSIS — I6522 Occlusion and stenosis of left carotid artery: Secondary | ICD-10-CM | POA: Diagnosis present

## 2019-09-18 DIAGNOSIS — M4810 Ankylosing hyperostosis [Forestier], site unspecified: Secondary | ICD-10-CM | POA: Diagnosis present

## 2019-09-18 DIAGNOSIS — I482 Chronic atrial fibrillation, unspecified: Secondary | ICD-10-CM | POA: Diagnosis present

## 2019-09-18 DIAGNOSIS — F1410 Cocaine abuse, uncomplicated: Secondary | ICD-10-CM | POA: Diagnosis present

## 2019-09-18 LAB — GLUCOSE, CAPILLARY: Glucose-Capillary: 96 mg/dL (ref 70–99)

## 2019-09-18 LAB — CREATININE, SERUM
Creatinine, Ser: 1.45 mg/dL — ABNORMAL HIGH (ref 0.61–1.24)
GFR calc Af Amer: 56 mL/min — ABNORMAL LOW (ref >60)
GFR calc non Af Amer: 48 mL/min — ABNORMAL LOW (ref >60)

## 2019-09-18 MED ORDER — PROCHLORPERAZINE 25 MG RE SUPP: 12.5000 mg | Freq: Four times a day (QID) | RECTAL | Status: DC | PRN

## 2019-09-18 MED ORDER — ATORVASTATIN CALCIUM 80 MG PO TABS
80.0000 mg | ORAL_TABLET | Freq: Every evening | ORAL | Status: DC
  Administered 2019-09-18 – 2019-09-26 (×9): 80 mg via ORAL
  Filled 2019-09-18 (×10): qty 1

## 2019-09-18 MED ORDER — ACETAMINOPHEN 325 MG PO TABS: 650.0000 mg | ORAL_TABLET | ORAL | Status: DC | PRN

## 2019-09-18 MED ORDER — GABAPENTIN 300 MG PO CAPS
300.0000 mg | ORAL_CAPSULE | Freq: Three times a day (TID) | ORAL | Status: DC
  Administered 2019-09-18: 300 mg via ORAL
  Filled 2019-09-18: qty 1

## 2019-09-18 MED ORDER — CYCLOBENZAPRINE HCL 5 MG PO TABS: 5.0000 mg | ORAL_TABLET | Freq: Three times a day (TID) | ORAL | Status: DC | PRN

## 2019-09-18 MED ORDER — FLEET ENEMA 7-19 GM/118ML RE ENEM: 1.0000 | ENEMA | Freq: Once | RECTAL | Status: DC | PRN

## 2019-09-18 MED ORDER — ASPIRIN EC 81 MG PO TBEC
81.0000 mg | DELAYED_RELEASE_TABLET | Freq: Every day | ORAL | Status: DC
  Administered 2019-09-18 – 2019-09-27 (×10): 81 mg via ORAL
  Filled 2019-09-18 (×10): qty 1

## 2019-09-18 MED ORDER — AMLODIPINE BESYLATE 5 MG PO TABS: 5.0000 mg | ORAL_TABLET | Freq: Every day | ORAL | 0 refills | Status: DC

## 2019-09-18 MED ORDER — GABAPENTIN 100 MG PO CAPS: 100.0000 mg | ORAL_CAPSULE | Freq: Three times a day (TID) | ORAL | Status: DC

## 2019-09-18 MED ORDER — CYCLOBENZAPRINE HCL 5 MG PO TABS: 5.0000 mg | ORAL_TABLET | Freq: Three times a day (TID) | ORAL | 0 refills | Status: DC | PRN

## 2019-09-18 MED ORDER — NICOTINE 7 MG/24HR TD PT24
7.0000 mg | MEDICATED_PATCH | Freq: Every day | TRANSDERMAL | Status: DC
  Administered 2019-09-19 – 2019-09-27 (×9): 7 mg via TRANSDERMAL
  Filled 2019-09-18 (×10): qty 1

## 2019-09-18 MED ORDER — ACETAMINOPHEN 325 MG PO TABS
325.0000 mg | ORAL_TABLET | ORAL | Status: DC | PRN
  Administered 2019-09-21 – 2019-09-23 (×3): 650 mg via ORAL
  Filled 2019-09-18 (×3): qty 2

## 2019-09-18 MED ORDER — BISACODYL 10 MG RE SUPP: 10.0000 mg | Freq: Every day | RECTAL | Status: DC | PRN

## 2019-09-18 MED ORDER — METOPROLOL TARTRATE 12.5 MG HALF TABLET
12.5000 mg | ORAL_TABLET | Freq: Two times a day (BID) | ORAL | Status: DC
  Administered 2019-09-18 – 2019-09-27 (×18): 12.5 mg via ORAL
  Filled 2019-09-18 (×18): qty 1

## 2019-09-18 MED ORDER — ALLOPURINOL 300 MG PO TABS
300.0000 mg | ORAL_TABLET | Freq: Every day | ORAL | Status: DC
  Administered 2019-09-19 – 2019-09-27 (×9): 300 mg via ORAL
  Filled 2019-09-18 (×9): qty 1

## 2019-09-18 MED ORDER — PROCHLORPERAZINE MALEATE 5 MG PO TABS: 5.0000 mg | ORAL_TABLET | Freq: Four times a day (QID) | ORAL | Status: DC | PRN

## 2019-09-18 MED ORDER — TRAMADOL HCL 50 MG PO TABS
50.0000 mg | ORAL_TABLET | Freq: Four times a day (QID) | ORAL | Status: DC | PRN
  Administered 2019-09-19 – 2019-09-26 (×10): 50 mg via ORAL
  Filled 2019-09-18 (×10): qty 1

## 2019-09-18 MED ORDER — GABAPENTIN 100 MG PO CAPS: 100.0000 mg | ORAL_CAPSULE | Freq: Three times a day (TID) | ORAL | 0 refills | Status: DC

## 2019-09-18 MED ORDER — ATORVASTATIN CALCIUM 80 MG PO TABS: 80.0000 mg | ORAL_TABLET | Freq: Every evening | ORAL | 0 refills | Status: DC

## 2019-09-18 MED ORDER — TRAZODONE HCL 50 MG PO TABS
25.0000 mg | ORAL_TABLET | Freq: Every evening | ORAL | Status: DC | PRN
  Administered 2019-09-18: 22:00:00 50 mg via ORAL
  Filled 2019-09-18: qty 1

## 2019-09-18 MED ORDER — DIPHENHYDRAMINE HCL 12.5 MG/5ML PO ELIX: 12.5000 mg | ORAL_SOLUTION | Freq: Four times a day (QID) | ORAL | Status: DC | PRN

## 2019-09-18 MED ORDER — GABAPENTIN 100 MG PO CAPS
100.0000 mg | ORAL_CAPSULE | Freq: Three times a day (TID) | ORAL | Status: DC
  Administered 2019-09-18 – 2019-09-27 (×27): 100 mg via ORAL
  Filled 2019-09-18 (×26): qty 1

## 2019-09-18 MED ORDER — POTASSIUM CHLORIDE CRYS ER 20 MEQ PO TBCR
40.0000 meq | EXTENDED_RELEASE_TABLET | Freq: Every day | ORAL | Status: DC
  Administered 2019-09-19 – 2019-09-26 (×8): 40 meq via ORAL
  Filled 2019-09-18 (×8): qty 2

## 2019-09-18 MED ORDER — SODIUM CHLORIDE 0.9 % IV BOLUS
250.0000 mL | Freq: Once | INTRAVENOUS | Status: AC
  Administered 2019-09-18: 09:00:00 250 mL via INTRAVENOUS

## 2019-09-18 MED ORDER — AMLODIPINE BESYLATE 5 MG PO TABS
5.0000 mg | ORAL_TABLET | Freq: Every day | ORAL | Status: DC
  Administered 2019-09-19 – 2019-09-27 (×9): 5 mg via ORAL
  Filled 2019-09-18 (×9): qty 1

## 2019-09-18 MED ORDER — NICOTINE 7 MG/24HR TD PT24: 7.0000 mg | MEDICATED_PATCH | Freq: Every day | TRANSDERMAL | 0 refills | Status: DC

## 2019-09-18 MED ORDER — PROCHLORPERAZINE EDISYLATE 10 MG/2ML IJ SOLN: 5.0000 mg | Freq: Four times a day (QID) | INTRAMUSCULAR | Status: DC | PRN

## 2019-09-18 MED ORDER — ALUM & MAG HYDROXIDE-SIMETH 200-200-20 MG/5ML PO SUSP: 30.0000 mL | ORAL | Status: DC | PRN

## 2019-09-18 MED ORDER — METOPROLOL TARTRATE 25 MG PO TABS: 12.5000 mg | ORAL_TABLET | Freq: Two times a day (BID) | ORAL | 0 refills | Status: DC

## 2019-09-18 MED ORDER — POLYETHYLENE GLYCOL 3350 17 G PO PACK
17.0000 g | PACK | Freq: Every day | ORAL | Status: DC | PRN
  Administered 2019-09-20: 09:00:00 17 g via ORAL
  Filled 2019-09-18: qty 1

## 2019-09-18 MED ORDER — GUAIFENESIN-DM 100-10 MG/5ML PO SYRP: 5.0000 mL | ORAL_SOLUTION | Freq: Four times a day (QID) | ORAL | Status: DC | PRN

## 2019-09-18 MED ORDER — LIDOCAINE 5 % EX PTCH: MEDICATED_PATCH | CUTANEOUS | 0 refills | Status: DC

## 2019-09-18 MED ORDER — APIXABAN 5 MG PO TABS
5.0000 mg | ORAL_TABLET | Freq: Two times a day (BID) | ORAL | Status: DC
  Administered 2019-09-18 – 2019-09-27 (×18): 5 mg via ORAL
  Filled 2019-09-18 (×18): qty 1

## 2019-09-18 MED ORDER — LIDOCAINE 5 % EX PTCH
1.0000 | MEDICATED_PATCH | Freq: Two times a day (BID) | CUTANEOUS | Status: DC
  Administered 2019-09-18 – 2019-09-27 (×18): 1 via TRANSDERMAL
  Filled 2019-09-18 (×18): qty 1

## 2019-09-18 NOTE — Progress Notes (Signed)
Pt discharged to CIR vis ambulance. Report given to [place], Therapist, sports. No further questions at this time. Discharge instructions placed in pt packet.  No s/s of distress noted. IV and tele monitor removed.

## 2019-09-18 NOTE — Progress Notes (Signed)
PMR Admission Coordinator Pre-Admission Assessment   Patient: [name] is an 69 y.o., male MRN: [id number] DOB: Sep 20, 1950 Height: 6' 2"  (188 cm) Weight: 97.1 kg   Insurance Information HMO: yes    PPO:      PCP:      IPA:      80/20:      OTHER:  PRIMARY: [employer] Medicare      Policy#: [id number]      Subscriber: pt  CM Name: Kennyth Lose      Phone#: [phone]     Fax#: [phone] Pre-Cert#: [id number] Josem Kaufmann for CIR provided by Kennyth Lose at [facility] with updates due to fax listed above on 5/10      Employer: n/a Benefits:  Phone #: [phone]     Name:  Eff. Date: 05/18/19     Deduct: $0      Out of Pocket Max: $3600 (met $150.53)      Life Max: n/a CIR: $295/day for days 1-5      SNF: 20 full days Outpatient:      Co-Pay: $30/visit Home Health: 100%      Co-Pay:  DME: 80%     Co-Pay: 20% Providers:  SECONDARY:       Policy#:      Phone#:    Development worker, community:       Phone#:    The Therapist, art Information Summary" for patients in Inpatient Rehabilitation Facilities with attached "Privacy Act [place] Records" was provided and verbally reviewed with: Patient and Family   Emergency Contact Information         Contact Information     Name Relation Home Work Mobile    [place] [phone]   [place], Lia Foyer     [phone]         Current Medical History  Patient Admitting Diagnosis: L PCA CVA   History of Present Illness: Pt is a 69 y/o male with PMH of CVA, DVT, afib, DM, and HTN admitted with dizziness and HA for a few hours.  Also reported difficulty speaking x1 week but did not seek medical intention for that, as well as blurry vision and difficulty walking.  CT angio showed bilateral vertebral artery stenosis, MRI showed acute CVA in R temporal lobe and R occipital lobe in the PCA.  Pt already on Eliquis and tele-neurology recommended continue high dose atorvastatin.  Pt trending hypotensive and BP medications  adjusted.  Pt with tachycardia with ambulation but bradycardia with pauses overnight.  Cardiology consult felt due to possible sleep apnea.  Therapy evaluations were completed and pt was recommended for CIR to maximize independence prior to returning home.    Complete NIHSS TOTAL: 5   Patient's medical record from [facility] has been reviewed by the rehabilitation admission coordinator and physician.   Past Medical History      Past Medical History:  Diagnosis Date  . Arthritis    . Atrial fibrillation ([place])    . Cocaine abuse ([place])      + UDS on admission  . Diabetes mellitus without complication ([place])    . Gout current and history of  . History of deep vein thrombosis 2006  . History of tobacco abuse      has smoked for 50 years  . Hypertension    . Pulmonary embolism ([place]) 2010  . Stroke [facility]) 2017    affected speech and right side-uses cane      Family History   family history includes  Aneurysm in his mother; Diabetes in his father.   Prior Rehab/Hospitalizations Has the patient had prior rehab or hospitalizations prior to admission? Yes   Has the patient had major surgery during 100 days prior to admission? No               Current Medications   Current Facility-Administered Medications:  .  acetaminophen (TYLENOL) tablet 650 mg, 650 mg, Oral, Q4H PRN, 650 mg at 09/17/19 6144 **OR** acetaminophen (TYLENOL) 160 MG/5ML solution 650 mg, 650 mg, Per Tube, Q4H PRN **OR** acetaminophen (TYLENOL) suppository 650 mg, 650 mg, Rectal, Q4H PRN, Wieting, Richard, MD .  allopurinol (ZYLOPRIM) tablet 300 mg, 300 mg, Oral, Daily, Leslye Peer, Richard, MD, 300 mg at 09/18/19 0857 .  amLODipine (NORVASC) tablet 5 mg, 5 mg, Oral, Daily, Leslye Peer, Richard, MD, 5 mg at 09/18/19 0856 .  apixaban (ELIQUIS) tablet 5 mg, 5 mg, Oral, BID, Leslye Peer, Richard, MD, 5 mg at 09/18/19 0855 .  atorvastatin (LIPITOR) tablet 80 mg, 80 mg, Oral, QPM, Loletha [name], MD, 80 mg at 09/17/19 1802 .  cyclobenzaprine  (FLEXERIL) tablet 5 mg, 5 mg, Oral, TID PRN, Loletha [name], MD, 5 mg at 09/17/19 2146 .  gabapentin (NEURONTIN) capsule 300 mg, 300 mg, Oral, TID, Leslye Peer, Richard, MD, 300 mg at 09/18/19 0857 .  lidocaine (LIDODERM) 5 % 1 patch, 1 patch, Transdermal, Q12H, Loletha [name], MD, 1 patch at 09/18/19 0859 .  metoprolol tartrate (LOPRESSOR) tablet 12.5 mg, 12.5 mg, Oral, BID, Teodoro Spray, MD, 12.5 mg at 09/18/19 0855 .  nicotine (NICODERM CQ - dosed in mg/24 hr) patch 7 mg, 7 mg, Transdermal, Daily, Leslye Peer, Richard, MD, 7 mg at 09/18/19 0858 .  polyethylene glycol (MIRALAX / GLYCOLAX) packet 17 g, 17 g, Oral, Daily PRN, Wieting, Richard, MD .  potassium chloride SA (KLOR-CON) CR tablet 40 mEq, 40 mEq, Oral, BID, Loletha [name], MD, 40 mEq at 09/18/19 0855 .  traMADol (ULTRAM) tablet 50 mg, 50 mg, Oral, Q6H PRN, Loletha [name], MD, 50 mg at 09/17/19 2146   Patients Current Diet:     Diet Order                      Diet heart healthy/carb modified Room service appropriate? Yes; Fluid consistency: Thin  Diet effective now                   Precautions / Restrictions Precautions Precautions: Fall Restrictions Weight Bearing Restrictions: No    Has the patient had 2 or more falls or a fall with injury in the past year? Yes   Prior Activity Level Community (5-7x/wk): independent (reportedly still driving with L hemonymous hemianopsia? from previous R PCA CVA), no DME used prior to admission   Prior Functional Level Self Care: Did the patient need help bathing, dressing, using the toilet or eating? Independent   Indoor Mobility: Did the patient need assistance with walking from room to room (with or without device)? Independent   Stairs: Did the patient need assistance with internal or external stairs (with or without device)? Independent   Functional Cognition: Did the patient need help planning regular tasks such as shopping or remembering to take medications? Independent    Home Assistive Devices / Equipment Home Assistive Devices/Equipment: Cane (specify quad or straight) Home Equipment: Cane - single point, Grab bars - tub/shower   Prior Device Use: Indicate devices/aids used by the patient prior to current illness, exacerbation or injury? cane   Current Functional Level  Cognition   Overall Cognitive Status: Within Functional Limits for tasks assessed Orientation Level: Oriented X4 General Comments: pt is A and O but does require incraesed time to process/dysarthria    Extremity Assessment (includes Sensation/Coordination)   Upper Extremity Assessment: Generalized weakness(R UE grossly 4/5; L UE grossly 5/5, denies sensory deficits, FMC WNL)  Lower Extremity Assessment: Overall WFL for tasks assessed     ADLs   Overall ADL's : Needs assistance/impaired Eating/Feeding: Sitting, Set up, Minimal assistance Eating/Feeding Details (indicate cue type and reason): Visual cues Upper Body Bathing: Sitting, Set up, Supervision/ safety Lower Body Bathing: Min guard, Set up, Sit to/from stand Upper Body Dressing : Sitting, Set up, Supervision/safety Lower Body Dressing: Sit to/from stand, Min guard, Set up Toilet Transfer: Ambulation, Min guard General ADL Comments: Pt seen for LB dressing skills and standing to use urinal and visually navigate ambulation in room followed by vision exercises.  He has decreased convergence in R eye and presents with homonymous hemianopsia and needs cues to learn how to compensate for this.  He needs extra time to communicate his thoughts and get words out with good articulation.  Difficulty with [facility] tasks including opening containers/packets requiring assist but able to use urinal with only SBA for balance but did not communicate needs appropriately since he said he would use urinal sitting but then proceeded to stand up. Pt stated he has less dizziness but currently had a headache. Pt instructed in continued visual compensatory  strategies to improve safe access to environment, particularly table top items for meals, grooming, and IADL such as reading tasks. Pt demo'd difficulty with identifying letter, often confusing similarly shaped letters but with cues for visual rest breaks and using fingers to help pt visually scan L to R. Pt reports that sometimes he is unable to see something he is looking at but with brief visual rest breaks (with initial instruction) pt reports he sometimes is then able to see what he missed. Pt continues to demonstrate excellent motivation and good progress towards goals. Continues to be great candidate for CIR upon discharge to support pt's return to modified independent level or better in order to minimize falls, functional decline, and increased caregiver burden.     Mobility   Overal bed mobility: Modified Independent Bed Mobility: Supine to Sit, Sit to Supine Supine to sit: Modified independent (Device/Increase time) Sit to supine: Modified independent (Device/Increase time) General bed mobility comments: Pt was able to exit L side of bed with supervision however HOB was elevated, bed rails used, and increased time to perform. Pt continues to have coordination deficits present.     Transfers   Overall transfer level: Needs assistance Equipment used: Rolling walker (2 wheeled) Transfers: Sit to/from Stand Sit to Stand: Min guard, Min assist General transfer comment: verbal cues for hand placements, some difficulty from lower bed surfaces     Ambulation / Gait / Stairs / Wheelchair Mobility   Ambulation/Gait Ambulation/Gait assistance: Herbalist (Feet): 160 Feet Assistive device: Rolling walker (2 wheeled) Gait Pattern/deviations: Step-through pattern, Decreased step length - left, Decreased step length - right, Decreased stance time - left General Gait Details: continues to run inot objects in hallway with RW increasing fall risk.  limited today due to increased  dizziness. Gait velocity: decreased     Posture / Balance Dynamic Sitting Balance Sitting balance - Comments: no LOB in sitting Balance Overall balance assessment: Needs assistance Sitting-balance support: Feet supported Sitting balance-Leahy Scale: Good Sitting balance -  Comments: no LOB in sitting Standing balance support: Bilateral upper extremity supported Standing balance-Leahy Scale: Fair Standing balance comment: hesitant with SLS activities on LLE despite BUE handhold on walker Standardized Balance Assessment Standardized Balance Assessment : Berg Balance Test Berg Balance Test Sit to Stand: Able to stand without using hands and stabilize independently Standing Unsupported: Able to stand safely 2 minutes Sitting with Back Unsupported but Feet Supported on Floor or Stool: Able to sit safely and securely 2 minutes Stand to Sit: Controls descent by using hands Transfers: Able to transfer safely, definite need of hands Standing Unsupported with Eyes Closed: Able to stand 10 seconds safely Standing Ubsupported with Feet Together: Able to place feet together independently and stand 1 minute safely From Standing, Reach Forward with Outstretched Arm: Can reach forward >12 cm safely (5") From Standing Position, Pick up Object from Floor: Able to pick up shoe, needs supervision From Standing Position, Turn to Look Behind Over each Shoulder: Looks behind one side only/other side shows less weight shift Turn 360 Degrees: Needs close supervision or verbal cueing Standing Unsupported, Alternately Place Feet on Step/Stool: Able to complete >2 steps/needs minimal assist Standing Unsupported, One Foot in Front: Able to take small step independently and hold 30 seconds Standing on One Leg: Tries to lift leg/unable to hold 3 seconds but remains standing independently Total Score: 40     Special needs/care consideration Diabetic management yes, Special service needs bilateral hemianopsia and  Designated visitor [place] (from acute therapy documentation) Living Arrangements: Alone  Lives With: Alone Available Help at Discharge: Friend(s), Available PRN/intermittently Type of Home: Apartment Home Layout: One level Home Access: Level entry Bathroom Shower/Tub: Chiropodist: Standard Home Care Services: No   Discharge Living Setting Plans for Discharge Living Setting: Patient's home Type of Home at Discharge: Apartment Discharge Home Layout: One level Discharge Home Access: Level entry Discharge Bathroom Shower/Tub: Tub/shower unit Discharge Bathroom Toilet: Standard Discharge Bathroom Accessibility: Yes How Accessible: Accessible via walker Does the patient have any problems obtaining your medications?: No   Social/Family/Support Systems Anticipated Caregiver: friend, Karna Dupes Anticipated Caregiver's Contact Information: [phone] Ability/Limitations of Caregiver: supervision to min guard only Caregiver Availability: 24/7 Discharge Plan Discussed with Primary Caregiver: Yes(confirmed by Ovidio Kin (CSW)) Is Caregiver In Agreement with Plan?: Yes Does Caregiver/Family have Issues with Lodging/Transportation while Pt is in Rehab?: No   Goals Patient/Family Goal for Rehab: PT/OT mod I to min guard, SLP n/a Expected length of stay: 6-9 days Pt/Family Agrees to Admission and willing to participate: Yes Program Orientation Provided & Reviewed with Pt/Caregiver Including Roles  & Responsibilities: Yes  Barriers to Discharge: Insurance for SNF coverage, Other (comments)(new visual deficits)   Decrease burden of Care through IP rehab admission: n/a   Possible need for SNF placement upon discharge: Not anticipated   Patient Condition: I have reviewed medical records from [facility], spoken with CM, and patient. I discussed via phone for inpatient rehabilitation assessment.  Patient will benefit from ongoing PT and  OT, can actively participate in 3 hours of therapy a day 5 days of the week, and can make measurable gains during the admission.  Patient will also benefit from the coordinated team approach during an Inpatient Acute Rehabilitation admission.  The patient will receive intensive therapy as well as Rehabilitation physician, nursing, social worker, and care management interventions.  Due to safety, disease management and patient education the patient requires 24 hour a day rehabilitation nursing.  The patient is currently min guard with mobility and basic ADLs.  Discharge setting and therapy post discharge at home with home health is anticipated.  Patient has agreed to participate in the Acute Inpatient Rehabilitation Program and will admit today.   Preadmission Screen Completed By:  Michel Santee, PT, DPT 09/18/2019 9:50 AM ______________________________________________________________________   Discussed status with Dr. Dagoberto Ligas on 09/18/19  at 9:50 AM  and received approval for admission today.   Admission Coordinator:  Michel Santee, PT, DPT time 9:50 AM Sudie Grumbling 09/18/19     Assessment/Plan: Diagnosis: 1. Does the need for close, 24 hr/day Medical supervision in concert with the patient's rehab needs make it unreasonable for this patient to be served in a less intensive setting? Yes 2. Co-Morbidities requiring supervision/potential complications: Afib, cocaine, DM, DVT, HTN 3. Due to bowel management, safety, skin/wound care, disease management, medication administration, pain management and patient education, does the patient require 24 hr/day rehab nursing? Yes 4. Does the patient require coordinated care of a physician, rehab nurse, PT, OT, and SLP to address physical and functional deficits in the context of the above medical diagnosis(es)? Yes Addressing deficits in the following areas: balance, endurance, locomotion, strength, transferring, bathing, dressing, feeding, grooming and  toileting 5. Can the patient actively participate in an intensive therapy program of at least 3 hrs of therapy 5 days a week? Yes 6. The potential for patient to make measurable gains while on inpatient rehab is good and fair 7. Anticipated functional outcomes upon discharge from inpatient rehab: modified independent and supervision PT, modified independent, supervision and min assist OT, n/a SLP 8. Estimated rehab length of stay to reach the above functional goals is: 6-9 days 9. Anticipated discharge destination: Home 10. Overall Rehab/Functional Prognosis: good and fair     MD Signature:

## 2019-09-18 NOTE — Progress Notes (Signed)
Patient ID: [name], male   DOB: [birth year], 69 y.o.   MRN: [id number] arrived to unit A/O x4 . Plan of care, Patient safety fall prevention, and visitor's policy has been reviewed with patient. Denies any pains at the moment, will continue to monitor. Amanda Cockayne, LPN

## 2019-09-18 NOTE — Progress Notes (Signed)
Inpatient Rehab Admissions Coordinator:   I have insurance authorization and a bed available for this patient to admit to CIR today. I will let pt/family and TOC team know.   Shann Medal, PT, DPT Admissions Coordinator [phone] 09/18/19  10:10 AM
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
## 2019-09-18 NOTE — Discharge Summary (Signed)
[place] [facility] [place] NAME: [name]    MR#:  [id number]  DATE OF BIRTH:  19-Mar-1951  DATE OF ADMISSION:  09/12/2019 ADMITTING PHYSICIAN: Loletha [name], MD  DATE OF DISCHARGE: 09/18/2019  PRIMARY CARE PHYSICIAN: Theotis Burrow, MD    ADMISSION DIAGNOSIS:  Dizziness [R42] Cerebrovascular accident (CVA), unspecified mechanism ([place]) [I63.9] CVA (cerebral vascular accident) ([place]) [I63.9]  DISCHARGE DIAGNOSIS:  Active Problems:   Type 2 diabetes mellitus without complication ([place])   Accelerated hypertension   Essential hypertension   Chronic a-fib (HCC)   Dizziness   CVA (cerebral vascular accident) ([place])   Vision loss, bilateral   Tobacco abuse   Bradycardia   Non-sustained ventricular tachycardia ([place])   SECONDARY DIAGNOSIS:   Past Medical History:  Diagnosis Date  . Arthritis   . Atrial fibrillation ([place])   . Cocaine abuse ([place])    + UDS on admission  . Diabetes mellitus without complication ([place])   . Gout current and history of  . History of deep vein thrombosis 2006  . History of tobacco abuse    has smoked for 50 years  . Hypertension   . Pulmonary embolism ([place]) 2010  . Stroke [facility]) 2017   affected speech and right side-uses cane    HOSPITAL COURSE:   1.  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  Patient already on Eliquis.  Zocor switched over to high-dose atorvastatin.  Physical therapy recommending CIR.  Received insurance authorization today so will be going over there.  LDL 72 and goal less than 70.  Patient also has some difficulty with speech and incoordination which could be secondary to the vision. 2.  Bilateral vision loss.  This is likely from prior stroke and current stroke.  On Eliquis for anticoagulation.  Seen by ophthalmology and can follow-up with them as outpatient. 3.  Acute kidney injury likely from relative hypotension.  Giving a fluid bolus  this morning.  Holding Lasix and Cozaar.  Recommend checking a BMP on Thursday. 4.  Patient had accelerated hypertension on presentation now blood pressure on the lower side.  We will continue Norvasc 5 mg and metoprolol 12.5 mg twice daily.  Hold Lasix and Cozaar.  Hydralazine discontinued yesterday.  Patient did have a little dizziness this morning. 5.  Patient's heart rate increases with ambulation but did have bradycardia and pauses overnight.  Audiology thinks that he has sleep apnea which is the cause of the bradycardia and pauses overnight.  Okay with low-dose metoprolol 12.5 mg twice daily 6.  Chronic atrial fibrillation on low-dose metoprolol and Eliquis for anticoagulation 7.  Type 2 diabetes mellitus with hyperlipidemia.  Hemoglobin A1c is excellent at 6.2.  Diet control for diabetes.  high-dose Lipitor.  Diabetic neuropathy on gabapentin will lower the dose.    DISCHARGE CONDITIONS:   Satisfactory  CONSULTS OBTAINED:  Treatment Team:  Catarina Hartshorn, MD Yolonda Kida, MD Teodoro Spray, MD [name], MD  DRUG ALLERGIES:  No Known Allergies  DISCHARGE MEDICATIONS:   Allergies as of 09/18/2019   No Known Allergies     Medication List    STOP taking these medications   Aspirin-Calcium Carbonate 81-777 MG Tabs   furosemide 40 MG tablet Commonly known as: LASIX   losartan 100 MG tablet Commonly known as: COZAAR   simvastatin 20 MG tablet Commonly known as: ZOCOR     TAKE these medications   acetaminophen 325 MG  tablet Commonly known as: TYLENOL Take 2 tablets (650 mg total) by mouth every 4 (four) hours as needed for mild pain (or temp > 37.5 C (99.5 F)).   allopurinol 300 MG tablet Commonly known as: ZYLOPRIM Take 300 mg by mouth daily.   amLODipine 5 MG tablet Commonly known as: NORVASC Take 1 tablet (5 mg total) by mouth daily. Start taking on: Sep 19, 2019   apixaban 5 MG Tabs tablet Commonly known as: Eliquis Take 1 tablet (5 mg total)  by mouth 2 (two) times daily.   atorvastatin 80 MG tablet Commonly known as: LIPITOR Take 1 tablet (80 mg total) by mouth every evening.   colchicine 0.6 MG tablet Take 0.6 mg by mouth daily as needed (gout flare).   cyclobenzaprine 5 MG tablet Commonly known as: FLEXERIL Take 1 tablet (5 mg total) by mouth 3 (three) times daily as needed for muscle spasms.   gabapentin 100 MG capsule Commonly known as: NEURONTIN Take 1 capsule (100 mg total) by mouth 3 (three) times daily. What changed:  medication strength how much to take   lidocaine 5 % Commonly known as: Lidoderm Remove & Discard patch within 12 hours or as directed by MD on areas of pain What changed:  how much to take how to take this when to take this additional instructions   metoprolol tartrate 25 MG tablet Commonly known as: LOPRESSOR Take 0.5 tablets (12.5 mg total) by mouth 2 (two) times daily. What changed: how much to take   nicotine 7 mg/24hr patch Commonly known as: NICODERM CQ - dosed in mg/24 hr Place 1 patch (7 mg total) onto the skin daily. Start taking on: Sep 19, 2019   polyethylene glycol 17 g packet Commonly known as: MIRALAX / GLYCOLAX Take 17 g by mouth daily as needed for mild constipation.        DISCHARGE INSTRUCTIONS:   Follow-up with team at rehab 1 day  If you experience worsening of your admission symptoms, develop shortness of breath, life threatening emergency, suicidal or homicidal thoughts you must seek medical attention immediately by calling 911 or calling your MD immediately  if symptoms less severe.  You Must read complete instructions/literature along with all the possible adverse reactions/side effects for all the Medicines you take and that have been prescribed to you. Take any new Medicines after you have completely understood and accept all the possible adverse reactions/side effects.   Please note  You were cared for by a hospitalist during your hospital stay. If  you have any questions about your discharge medications or the care you received while you were in the hospital after you are discharged, you can call the unit and asked to speak with the hospitalist on call if the hospitalist that took care of you is not available. Once you are discharged, your primary care physician will handle any further medical issues. Please note that NO REFILLS for any discharge medications will be authorized once you are discharged, as it is imperative that you return to your primary care physician (or establish a relationship with a primary care physician if you do not have one) for your aftercare needs so that they can reassess your need for medications and monitor your lab values.    Today   CHIEF COMPLAINT:   Chief Complaint  Patient presents with  . Dizziness    HISTORY OF PRESENT ILLNESS:  [name]  is a 69 y.o. male came in with dizziness and found to have  an acute stroke   VITAL SIGNS:  Blood pressure 107/80, pulse 63, temperature 97.7 F (36.5 C), temperature source Oral, resp. rate 16, height 6\' 2"  (1.88 m), weight 97.1 kg, SpO2 99 %.   PHYSICAL EXAMINATION:  GENERAL:  69 y.o.-year-old patient lying in the bed with no acute distress.  EYES: Pupils equal, round, reactive to light and accommodation. No scleral icterus.  HEENT: Head atraumatic, normocephalic. Oropharynx and nasopharynx clear.  NECK:  Supple, no jugular venous distention. No thyroid enlargement, no tenderness.  LUNGS: Normal breath sounds bilaterally, no wheezing, rales,rhonchi or crepitation. No use of accessory muscles of respiration.  CARDIOVASCULAR: S1, S2 irregularly irregular. No murmurs, rubs, or gallops.  ABDOMEN: Soft, non-tender, non-distended. Bowel sounds present. No organomegaly or mass.  EXTREMITIES: No pedal edema, cyanosis, or clubbing.  NEUROLOGIC: Slight speech slurred.  Muscle strength 5/5 in all extremities. PSYCHIATRIC: The patient is alert and oriented x  3.  SKIN: No obvious rash, lesion, or ulcer.   DATA REVIEW:   CBC Recent Labs  Lab 09/17/19 0253  WBC 2.6*  HGB 13.6  HCT 41.9  PLT 161    Chemistries  Recent Labs  Lab 09/12/19 1420 09/13/19 0524 09/14/19 0627 09/14/19 0627 09/18/19 0433  NA 142   < > 136  --   --   K 3.2*   < > 3.9  --   --   CL 106   < > 101  --   --   CO2 28   < > 27  --   --   GLUCOSE 155*   < > 102*  --   --   BUN 16   < > 13  --   --   CREATININE 0.93   < > 0.97   < > 1.45*  CALCIUM 8.8*   < > 9.0  --   --   MG  --   --  1.9  --   --   AST 24  --   --   --   --   ALT 17  --   --   --   --   ALKPHOS 72  --   --   --   --   BILITOT 0.7  --   --   --   --    < > = values in this interval not displayed.     Microbiology Results  Results for orders placed or performed during the hospital encounter of 09/12/19  Respiratory Panel by RT PCR (Flu A&B, Covid) - Nasopharyngeal Swab     Status: None   Collection Time: 09/12/19  3:20 PM   Specimen: Nasopharyngeal Swab  Result Value Ref Range Status   SARS Coronavirus 2 by RT PCR NEGATIVE NEGATIVE Final    Comment: (NOTE) SARS-CoV-2 target nucleic acids are NOT DETECTED. The SARS-CoV-2 RNA is generally detectable in upper respiratoy specimens during the acute phase of infection. The lowest concentration of SARS-CoV-2 viral copies this assay can detect is 131 copies/mL. A negative result does not preclude SARS-Cov-2 infection and should not be used as the sole basis for treatment or other patient management decisions. A negative result may occur with  improper specimen collection/handling, submission of specimen other than nasopharyngeal swab, presence of viral mutation(s) within the areas targeted by this assay, and inadequate number of viral copies (<131 copies/mL). A negative result must be combined with clinical observations, patient history, and epidemiological information. The expected result is Negative. Fact Sheet for Patients:  PinkCheek.be Fact Sheet for Healthcare Providers:  GravelBags.it This test is not yet ap proved or cleared by the [place] FDA and  has been authorized for detection and/or diagnosis of SARS-CoV-2 by FDA under an Emergency Use Authorization (EUA). This EUA will remain  in effect (meaning this test can be used) for the duration of the COVID-19 declaration under Section 564(b)(1) of the Act, 21 U.S.C. section 360bbb-3(b)(1), unless the authorization is terminated or revoked sooner.    Influenza A by PCR NEGATIVE NEGATIVE Final   Influenza B by PCR NEGATIVE NEGATIVE Final    Comment: (NOTE) The Xpert Xpress SARS-CoV-2/FLU/RSV assay is intended as an aid in  the diagnosis of influenza from Nasopharyngeal swab specimens and  should not be used as a sole basis for treatment. Nasal washings and  aspirates are unacceptable for Xpert Xpress SARS-CoV-2/FLU/RSV  testing. Fact Sheet for Patients: PinkCheek.be Fact Sheet for Healthcare Providers: GravelBags.it This test is not yet approved or cleared by the [place] FDA and  has been authorized for detection and/or diagnosis of SARS-CoV-2 by  FDA under an Emergency Use Authorization (EUA). This EUA will remain  in effect (meaning this test can be used) for the duration of the  Covid-19 declaration under Section 564(b)(1) of the Act, 21  U.S.C. section 360bbb-3(b)(1), unless the authorization is  terminated or revoked. Performed at [facility], [address] [place], [place] [address]      Management plans discussed with the patient, family and they are in agreement.  CODE STATUS:     Code Status Orders  (From admission, onward)         Start     Ordered   09/12/19 1541  Full code  Continuous     09/12/19 1542        Code Status History    Date Active Date Inactive Code Status Order ID  Comments User Context   04/07/2019 1210 04/11/2019 2052 Full Code [id number]  Para Skeans, MD ED   06/22/2018 1457 06/24/2018 1553 Full Code [id number]  Vaughan Basta, MD Inpatient   03/03/2017 1541 03/04/2017 1545 Full Code [id number]  Demetrios Loll, MD Inpatient   10/03/2016 1535 10/05/2016 1535 Full Code [id number]  [name], MD Inpatient   10/12/2014 0953 10/14/2014 1514 Full Code [id number]  Gladstone Lighter, MD Inpatient   Advance Care Planning Activity      TOTAL TIME TAKING CARE OF THIS PATIENT: 35 minutes.    Loletha [name] M.D on 09/18/2019 at 10:23 AM  Between 7am to 6pm - Pager - [phone]  After 6pm go to [email or web address] - password [email or web address]  [facility]  CC: Primary care physician; Revelo, Elyse Jarvis, MD

## 2019-09-19 ENCOUNTER — Inpatient Hospital Stay (HOSPITAL_COMMUNITY): Payer: Medicare Other

## 2019-09-19 ENCOUNTER — Inpatient Hospital Stay (HOSPITAL_COMMUNITY): Payer: Medicare Other | Admitting: Physical Therapy

## 2019-09-19 ENCOUNTER — Encounter (HOSPITAL_COMMUNITY): Payer: Self-pay | Admitting: Physical Medicine & Rehabilitation

## 2019-09-19 ENCOUNTER — Other Ambulatory Visit: Payer: Self-pay

## 2019-09-19 DIAGNOSIS — I63531 Cerebral infarction due to unspecified occlusion or stenosis of right posterior cerebral artery: Secondary | ICD-10-CM

## 2019-09-19 LAB — COMPREHENSIVE METABOLIC PANEL
ALT: 13 U/L (ref 0–44)
AST: 20 U/L (ref 15–41)
Albumin: 3.8 g/dL (ref 3.5–5.0)
Alkaline Phosphatase: 86 U/L (ref 38–126)
Anion gap: 7 (ref 5–15)
BUN: 35 mg/dL — ABNORMAL HIGH (ref 8–23)
CO2: 24 mmol/L (ref 22–32)
Calcium: 9.5 mg/dL (ref 8.9–10.3)
Chloride: 103 mmol/L (ref 98–111)
Creatinine, Ser: 1.37 mg/dL — ABNORMAL HIGH (ref 0.61–1.24)
GFR calc Af Amer: >60 mL/min (ref >60)
GFR calc non Af Amer: 52 mL/min — ABNORMAL LOW (ref >60)
Glucose, Bld: 121 mg/dL — ABNORMAL HIGH (ref 70–99)
Potassium: 5.1 mmol/L (ref 3.5–5.1)
Sodium: 134 mmol/L — ABNORMAL LOW (ref 135–145)
Total Bilirubin: 1 mg/dL (ref 0.3–1.2)
Total Protein: 7 g/dL (ref 6.5–8.1)

## 2019-09-19 LAB — CBC WITH DIFFERENTIAL/PLATELET
Abs Immature Granulocytes: 0.01 10*3/uL (ref 0.00–0.07)
Basophils Absolute: 0 10*3/uL (ref 0.0–0.1)
Basophils Relative: 1 %
Eosinophils Absolute: 0.1 10*3/uL (ref 0.0–0.5)
Eosinophils Relative: 4 %
HCT: 47.2 % (ref 39.0–52.0)
Hemoglobin: 14.9 g/dL (ref 13.0–17.0)
Immature Granulocytes: 0 %
Lymphocytes Relative: 44 %
Lymphs Abs: 1.5 10*3/uL (ref 0.7–4.0)
MCH: 25.5 pg — ABNORMAL LOW (ref 26.0–34.0)
MCHC: 31.6 g/dL (ref 30.0–36.0)
MCV: 80.7 fL (ref 80.0–100.0)
Monocytes Absolute: 0.3 10*3/uL (ref 0.1–1.0)
Monocytes Relative: 10 %
Neutro Abs: 1.4 10*3/uL — ABNORMAL LOW (ref 1.7–7.7)
Neutrophils Relative %: 41 %
Platelets: 196 10*3/uL (ref 150–400)
RBC: 5.85 MIL/uL — ABNORMAL HIGH (ref 4.22–5.81)
RDW: 17.8 % — ABNORMAL HIGH (ref 11.5–15.5)
WBC: 3.4 10*3/uL — ABNORMAL LOW (ref 4.0–10.5)
nRBC: 0 % (ref 0.0–0.2)
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
## 2019-09-19 NOTE — Progress Notes (Signed)
Social Work Patient ID: [name], male   DOB: 06/26/1950, 69 y.o.   MRN: [id number] SW entered room, patient still with therapy for today. SW called Ms. [name] to provide team conference update. Provided information from: MD, nursing and therapy. She was pleased. Sw will follow up with any questions or concerns.

## 2019-09-19 NOTE — Care Management (Signed)
[place] Individual Statement of Services  Patient Name:  [name]  Date:  09/19/2019  Welcome to the [facility].  Our goal is to provide you with an individualized program based on your diagnosis and situation, designed to meet your specific needs.  With this comprehensive rehabilitation program, you will be expected to participate in at least 3 hours of rehabilitation therapies Monday-Friday, with modified therapy programming on the weekends.  Your rehabilitation program will include the following services:  Physical Therapy (PT), Occupational Therapy (OT), Speech Therapy (ST), 24 hour per day rehabilitation nursing, Therapeutic Recreaction (TR), Neuropsychology, Case Management (Social Worker), Rehabilitation Medicine, Nutrition Services and Pharmacy Services  Weekly team conferences will be held on Wednesdays to discuss your progress.  Your Social Worker will talk with you frequently to get your input and to update you on team discussions.  Team conferences with you and your family in attendance may also be held.  Expected length of stay: 6-10 Days  Overall anticipated outcome: MOD I to Supervision   Depending on your progress and recovery, your program may change. Your Social Worker will coordinate services and will keep you informed of any changes. Your Social Worker's name and contact numbers are listed  below.  The following services may also be recommended but are not provided by the [facility]:    [place] will be made to provide these services after discharge if needed.  Arrangements include referral to agencies that provide these services.  Your insurance has been verified to be:  [employer] Your primary doctor is:  Theotis Burrow,  MD  Pertinent information will be shared with your doctor and your insurance  company.  Social Worker:  Erlene Quan, [place] or ([phone]   Information discussed with and copy given to patient by: Dyanne Iha, 09/19/2019, 9:33 AM
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
## 2019-09-19 NOTE — Patient Care Conference (Signed)
Inpatient RehabilitationTeam Conference and Plan of Care Update Date: 09/19/2019   Time: 10:20 AM    Patient Name: [name]      Medical Record Number: [id number]  Date of Birth: 1951/04/15 Sex: Male         Room/Bed: 4M10C/4M10C-01 Payor Info: Payor: Marine scientist / Plan: [employer] MEDICARE / Product Type: *No Product type* /    Admit Date/Time:  09/18/2019  2:05 PM  Primary Diagnosis:  <principal problem not specified>  Patient Active Problem List   Diagnosis Date Noted  . AKI (acute kidney injury) ([place])   . Cocaine abuse ([place])   . Acute ischemic right PCA stroke ([place]) 09/13/2019  . Vision loss, bilateral   . Tobacco abuse   . Bradycardia   . Non-sustained ventricular tachycardia ([place])   . Dizziness 09/12/2019  . Nonintractable headache   . Slurred speech   . Type 2 diabetes mellitus with hyperlipidemia ([place])   . PAD (peripheral artery disease) ([place]) 05/20/2019  . Chronic a-fib ([place]) 04/08/2019  . Fever 04/08/2019  . Sepsis ([place]) 04/08/2019  . Abdominal pain 04/07/2019  . 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 Pain due to onychomycosis of toenails of both feet 11/20/2018  . Diabetic neuropathy ([place]) 11/20/2018  . Influenza A 06/22/2018  . Community acquired pneumonia 06/22/2018  . Arthritis 05/12/2017  . Varicose veins of leg with swelling, bilateral 01/18/2017  . Facial twitching   . 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 Pneumonia 10/12/2014  . Osteoarthritis of right knee 02/11/2014  . Pain and swelling of knee 01/08/2014  . Pain in extremity 01/08/2014  . Weakness due to cerebrovascular accident 01/08/2014  . Pulmonary embolism ([place]) 06/19/2012  . Gout 09/22/2011  . 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 CVA (cerebral infarction) 04/26/2011  . Type 2 diabetes mellitus without complication ([place]) 40/98/1191  . Accelerated hypertension 04/26/2011  . Anemia 04/26/2011  . DVT (deep venous thrombosis)  ([place]) 04/26/2011  . Essential hypertension 04/26/2011    Expected Discharge Date: Expected Discharge Date: (Estimated LOS 7-10 days)  Team Members Present: Physician leading conference: Dr. Alysia Penna Care Coodinator Present: Karene Fry, RN, MSN;Christina Sampson Goon, BSW;[name] Tobin Chad, RN, [name], CRRN Nurse Present: Dorthula Nettles, RN PT Present: Barrie Folk, PT;Rosita Dechalus, PTA OT Present: Darleen Crocker, OT SLP Present: Jettie Booze, CF-SLP PPS Coordinator present : Ileana Ladd, Burna Mortimer, SLP     Current Status/Progress Goal Weekly Team Focus  Bowel/Bladder   continent of b/b; LBM: 05/04  remain continent of b/b  assist with toileting needs prn   Swallow/Nutrition/ Hydration   eval pending         ADL's   eval pending  eval pending  eval pending   Mobility   Eval pending  Eval Pending.      Communication   eval pending         Safety/Cognition/ Behavioral Observations  eval pending         Pain   no c/o pain  remain pain free  assess pain QS and prn   Skin   skin intact  maintain skin integrity  assess skin QS and prn    Rehab Goals Patient on target to meet rehab goals: Yes Rehab Goals Revised: On target with goals per evaluation *See Care Plan and progress notes for long and short-term goals.     Barriers to Discharge  Current Status/Progress Possible Resolutions Date Resolved   Nursing  PT  Inaccessible home environment;Decreased caregiver support;Home environment access/layout;Lack of/limited family support;Insurance for SNF coverage  SO not available 24/7 per patient              OT Decreased caregiver support;Lack of/limited family support                SLP Decreased caregiver support              SW Medical stability   On target with goals          Discharge Planning/Teaching Needs:  Patient plans to discharge home with his friend [name]. She confirmed that this is the plan  Will schedule education if  needed   Team Discussion: MD New R PCA infarct, L neglect, chronic gout, DM, BP low normal.  RN cont B/B, BM yesterday, slurred speech.  PT eval pending.  OT min A overall.  SW GF can provide 24/7 S.   Revisions to Treatment Plan: N/A     Medical Summary Current Status: Chronic right visual field deficit, partial with new left visual field deficit, does have left neglect as well, continent Weekly Focus/Goal: Initiate rehabilitation program  Barriers to Discharge: Medical stability;Other (comments)  Barriers to Discharge Comments: Stroke related visual deficits Possible Resolutions to Barriers: Cue to the left side for left neglect, initiate program   Continued Need for Acute Rehabilitation Level of Care: The patient requires daily medical management by a physician with specialized training in physical medicine and rehabilitation for the following reasons: Direction of a multidisciplinary physical rehabilitation program to maximize functional independence : Yes Medical management of patient stability for increased activity during participation in an intensive rehabilitation regime.: Yes Analysis of laboratory values and/or radiology reports with any subsequent need for medication adjustment and/or medical intervention. : Yes   I attest that I was present, lead the team conference, and concur with the assessment and plan of the team.   Retta Diones 09/20/2019, 5:00 PM   Team conference was held via web/ teleconference due to [place] - 19

## 2019-09-19 NOTE — Progress Notes (Signed)
Inpatient Rehabilitation  Patient information reviewed and entered into eRehab system by Klaus Casteneda M. Emersen Carroll, M.A., CCC/SLP, PPS Coordinator.  Information including medical coding, functional ability and quality indicators will be reviewed and updated through discharge.    

## 2019-09-19 NOTE — Progress Notes (Signed)
[facility] PHYSICAL MEDICINE & REHABILITATION PROGRESS NOTE   Subjective/Complaints:  Patient indicates that her vision is worse on the left side than on the right side. He is able to see the right side of the TV  No gout pain  Review of systems negative per chest pain shortness of breath nausea vomiting diarrhea  Objective:   No results found. Recent Labs    09/17/19 0253  WBC 2.6*  HGB 13.6  HCT 41.9  PLT 161   Recent Labs    09/18/19 0433  CREATININE 1.45*    Intake/Output Summary (Last 24 hours) at 09/19/2019 0805 Last data filed at 09/19/2019 0615 Gross per 24 hour  Intake 240 ml  Output 1400 ml  Net -1160 ml     Physical Exam: Vital Signs Blood pressure 102/81, pulse 87, temperature 97.9 F (36.6 C), resp. rate 15, height 6\' 2"  (1.88 m), weight 92.6 kg, SpO2 100 %.   General: No acute distress Mood and affect are appropriate Heart: Regular rate and rhythm no rubs murmurs or extra sounds Lungs: Clear to auscultation, breathing unlabored, no rales or wheezes Abdomen: Positive bowel sounds, soft nontender to palpation, nondistended Extremities: No clubbing, cyanosis, or edema Skin: No evidence of breakdown, no evidence of rash Neurologic: Cranial nerves II through XII intact, motor strength is 4/5 in bilateral deltoid, bicep, tricep, grip, hip flexor, knee extensors, ankle dorsiflexor and plantar flexor Sensory exam normal sensation to light touch  in bilateral upper and lower extremities  Musculoskeletal: Full range of motion in all 4 extremities. No joint swelling    Assessment/Plan: 1. Functional deficits secondary to acute R PCA infarct which require 3+ hours per day of interdisciplinary therapy in a comprehensive inpatient rehab setting.  Physiatrist is providing close team supervision and 24 hour management of active medical problems listed below.  Physiatrist and rehab team continue to assess barriers to discharge/monitor patient progress toward  functional and medical goals  Care Tool:  Bathing    Body parts bathed by patient: Buttocks, Right upper leg, Left upper leg         Bathing assist Assist Level: Contact Guard/Touching assist     Upper Body Dressing/Undressing Upper body dressing   What is the patient wearing?: Pull over shirt    Upper body assist Assist Level: Independent    Lower Body Dressing/Undressing Lower body dressing      What is the patient wearing?: Pants     Lower body assist Assist for lower body dressing: Independent     Toileting Toileting    Toileting assist Assist for toileting: Independent with assistive device Assistive Device Comment: urinal   Transfers Chair/bed transfer  Transfers assist     Chair/bed transfer assist level: Independent with assistive device     Locomotion Ambulation   Ambulation assist              Walk 10 feet activity   Assist           Walk 50 feet activity   Assist           Walk 150 feet activity   Assist           Walk 10 feet on uneven surface  activity   Assist           Wheelchair     Assist               Wheelchair 50 feet with 2 turns activity    Assist  Wheelchair 150 feet activity     Assist          Blood pressure 102/81, pulse 87, temperature 97.9 F (36.6 C), resp. rate 15, height 6\' 2"  (1.88 m), weight 92.6 kg, SpO2 100 %.  Medical Problem List and Plan: 1.  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  PE/DVT hx/Antithrombotics: -anticoagulation:  Pharmaceutical: Other (comment)--Eliquis             -antiplatelet therapy:   Stroke felt to be embolic--add ASA if patient has been compliant with Eliquis per neuro input. Will discuss with patient. .  3. Chronic pain/Pain Management: Continue  flexeril prn and lidocaine patches for local measures.  4. Mood: LCSW to follow for evaluation and support.              -antipsychotic agents: N/A 5. Neuropsych: This patient is capable of making decisions on his own behalf. 6. Skin/Wound Care: Routine pressure relief measures.  7. Fluids/Electrolytes/Nutrition: Monitor I/O. Check lytes in am.  8. CAF: Monitor HR TID.  9. GTX:MIWOEHO TID --continue low dose metoprolol and amlodipine. Was also on furosemide and Cozaar PTA.  Vitals:   09/19/19 0438 09/19/19 0803  BP: 120/84 102/81  Pulse: 67 87  Resp: 15   Temp: 97.9 F (36.6 C)   SpO2: 98% 100%  controlled 5/5 10. Neutropenia: Question transient --recheck in am.  11.  Right thyroid nodule: will need follow up ultrasound.  12.  H/o  Tophaceous gout: Continue colchicine  13. Hypokalemia: Has resolved--decrease supplement to once a day.  14. T2DM: Hgb A1c- 6.2 and is diet controlled, but pt says eats cookies/candy daily  CBG (last 3)  Recent Labs    09/17/19 0735 09/17/19 1141 09/18/19 1243  GLUCAP 82 90 96  controlled 5/5  15. 

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
## 2019-09-20 ENCOUNTER — Inpatient Hospital Stay (HOSPITAL_COMMUNITY): Payer: Medicare Other | Admitting: Occupational Therapy

## 2019-09-20 ENCOUNTER — Inpatient Hospital Stay (HOSPITAL_COMMUNITY): Payer: Medicare Other

## 2019-09-20 ENCOUNTER — Inpatient Hospital Stay (HOSPITAL_COMMUNITY): Payer: Medicare Other | Admitting: Physical Therapy

## 2019-09-20 NOTE — Progress Notes (Signed)
Physical Therapy Session Note  Patient Details  Name: Alastor Kneale MRN: [id number] Date of Birth: [birth year]  Today's Date: 09/20/2019 PT Individual Time: 1292-9090 PT Individual Time Calculation (min): 40 min   Short Term Goals: Week 1:  PT Short Term Goal 1 (Week 1): STG=LTG due to ELOS  Skilled Therapeutic Interventions/Progress Updates:   Pt received sitting in WC and agreeable to PT. Pt reports need to urination. Ambulatory transfer to toilet with SPC and CGA. Min cues for safety over threshold into bathroom. Pt able to void standing at toilet with supervision assist for safety and clothing management. Gait training through hall with [place] 2x 137f with close supervision assist; min cues from PT to improved Cane placemen to prevent kicking with the RLE in swing-through. UBE for BLE NMR and cardiorespiratory training, 10 min (5 min forward/5 min reverse) random resistance training. Pt returned to room and performed ambulatory transfer to bed with SPC. Sit>supine completed with supervision assist, and left supine in bed with call bell in reach and all needs met.        Therapy Documentation Precautions:  Precautions Precautions: Fall Restrictions Weight Bearing Restrictions: No    Vital Signs: Therapy Vitals Temp: 97.6 F (36.4 C) Temp Source: Oral Pulse Rate: 60 Resp: 16 BP: 118/70 Patient Position (if appropriate): Sitting Oxygen Therapy SpO2: 98 % O2 Device: Room Air Pain: denies   Therapy/Group: Individual Therapy  ALorie Phenix5/10/2019, 5:25 PM

## 2019-09-20 NOTE — Progress Notes (Addendum)
[place] PHYSICAL MEDICINE & REHABILITATION PROGRESS NOTE   Subjective/Complaints:  No issues overnite   Review of systems negative per chest pain shortness of breath nausea vomiting diarrhea  Objective:   No results found. Recent Labs    09/19/19 0716  WBC 3.4*  HGB 14.9  HCT 47.2  PLT 196   Recent Labs    09/18/19 0433 09/19/19 0716  NA  --  134*  K  --  5.1  CL  --  103  CO2  --  24  GLUCOSE  --  121*  BUN  --  35*  CREATININE 1.45* 1.37*  CALCIUM  --  9.5    Intake/Output Summary (Last 24 hours) at 09/20/2019 6568 Last data filed at 09/20/2019 0802 Gross per 24 hour  Intake 380 ml  Output 1175 ml  Net -795 ml     Physical Exam: Vital Signs Blood pressure 140/89, pulse (!) 57, temperature (!) 97.5 F (36.4 C), resp. rate 16, height 6\' 2"  (1.88 m), weight 92.6 kg, SpO2 100 %.    General: No acute distress Mood and affect appropriate  Heart: Regular rate and rhythm no rubs murmurs or extra sounds Lungs: Clear to auscultation, breathing unlabored, no rales or wheezes Abdomen: Positive bowel sounds, soft nontender to palpation, nondistended Extremities: No clubbing, cyanosis, or edema Skin: No evidence of breakdown, no evidence of rash   Musculoskeletal:RUE with dorsum hand and wrist edema, ecchymosis , no upper arm deformitiy     Assessment/Plan: 1. Functional deficits secondary to acute R PCA infarct which require 3+ hours per day of interdisciplinary therapy in a comprehensive inpatient rehab setting.  Physiatrist is providing close team supervision and 24 hour management of active medical problems listed below.  Physiatrist and rehab team continue to assess barriers to discharge/monitor patient progress toward functional and medical goals  Care Tool:  Bathing    Body parts bathed by patient: Right arm, Left arm, Chest, Abdomen, Front perineal area, Buttocks, Right upper leg, Left upper leg, Right lower leg, Left lower leg, Face          Bathing assist Assist Level: Minimal Assistance - Patient > 75%     Upper Body Dressing/Undressing Upper body dressing   What is the patient wearing?: Pull over shirt    Upper body assist Assist Level: Supervision/Verbal cueing    Lower Body Dressing/Undressing Lower body dressing      What is the patient wearing?: Pants, Underwear/pull up     Lower body assist Assist for lower body dressing: Minimal Assistance - Patient > 75%     Toileting Toileting    Toileting assist Assist for toileting: Independent with assistive device Assistive Device Comment: urinal   Transfers Chair/bed transfer  Transfers assist     Chair/bed transfer assist level: Minimal Assistance - Patient > 75%     Locomotion Ambulation   Ambulation assist      Assist level: Moderate Assistance - Patient 50 - 74% Assistive device: No Device Max distance: 50   Walk 10 feet activity   Assist     Assist level: Minimal Assistance - Patient > 75% Assistive device: No Device   Walk 50 feet activity   Assist    Assist level: Moderate Assistance - Patient - 50 - 74% Assistive device: No Device    Walk 150 feet activity   Assist Walk 150 feet activity did not occur: Safety/medical concerns         Walk 10 feet on uneven surface  activity   Assist     Assist level: Moderate Assistance - Patient - 50 - 74%     Wheelchair     Assist Will patient use wheelchair at discharge?: No Type of Wheelchair: Manual    Wheelchair assist level: Minimal Assistance - Patient > 75% Max wheelchair distance: 150    Wheelchair 50 feet with 2 turns activity    Assist        Assist Level: Minimal Assistance - Patient > 75%   Wheelchair 150 feet activity     Assist      Assist Level: Minimal Assistance - Patient > 75%   Blood pressure 140/89, pulse (!) 57, temperature (!) 97.5 F (36.4 C), resp. rate 16, height 6\' 2"  (1.88 m), weight 92.6 kg, SpO2 100 %.  Medical  Problem List and Plan: 1.  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  PE/DVT hx/Antithrombotics: -anticoagulation:  Pharmaceutical: Other (comment)--Eliquis             -antiplatelet therapy:   Stroke felt to be embolic--add ASA if patient has been compliant with Eliquis per neuro input. Will discuss with patient. .  3. Chronic pain/Pain Management: Continue flexeril prn and lidocaine patches for local measures.  4. Mood: LCSW to follow for evaluation and support.              -antipsychotic agents:  5. Neuropsych: This patient is capable of making decisions on his own behalf. 6. Skin/Wound Care: Routine pressure relief measures.  7. Fluids/Electrolytes/Nutrition: Monitor I/O. Check lytes in am.  8. CAF: Monitor HR TID.  9. PYK:DXIPJAS TID --continue low dose metoprolol and amlodipine. Was also on furosemide and Cozaar PTA.  Vitals:   09/19/19 2025 09/20/19 0544  BP: 138/82 140/89  Pulse: 72 (!) 57  Resp: 19 16  Temp: 98.5 F (36.9 C) (!) 97.5 F (36.4 C)  SpO2: 100% 100%  controlled 5/6 10. Neutropenia: Question transient --recheck in am.  11.  Right thyroid nodule: will need follow up ultrasound.  12.  H/o  Tophaceous gout: Continue colchicine  13. Hypokalemia: Has resolved--decrease supplement to once a day.  14. T2DM: Hgb A1c- 6.2 and is diet controlled, but pt says eats cookies/candy daily  CBG (last 3)  Recent Labs    09/17/19 1141 09/18/19 1243  GLUCAP 90 96  controlled 5/6  15. 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

## 2019-09-20 NOTE — IPOC Note (Signed)
Overall Plan of Care [facility]) Patient Details Name: [name] MRN: [id number] DOB: 1951-03-24  Admitting Diagnosis: <principal problem not specified>  Hospital Problems: Active Problems:   Type 2 diabetes mellitus without complication ([place])   CVA, old, speech/language deficit   Acute ischemic right PCA stroke ([place])     Functional Problem List: Nursing Bowel, Endurance, Motor, Nutrition, Safety, Behavior, Medication Management, Pain  PT Balance, Endurance, Motor, Perception, Safety, Sensory  OT Balance, Cognition, Endurance, Motor, Pain, Safety, Sensory, Vision  SLP    TR         Basic ADL's: OT Grooming, Bathing, Dressing, Toileting     Advanced  ADL's: OT       Transfers: PT Bed Mobility, Bed to Chair, Car, [employer], Futures trader, Metallurgist: PT Ambulation, Emergency planning/management officer, Stairs     Additional Impairments: OT Fuctional Use of Upper Extremity  SLP Communication, Social Cognition expression Memory  TR      Anticipated Outcomes Item Anticipated Outcome  Self Feeding no goal  Swallowing      Basic self-care  MOD I  Toileting  MOD I toilet; S shower   Bathroom Transfers MOD I toilet; S shower  Bowel/Bladder  manage bowel and bladder with mod I assist  Transfers  Mod I with LRAD  Locomotion  Mod I with LRAD at [employer] level  Communication  Supervision- Mod I  Cognition  Supervision A  Pain  pain level less than 4 on scale of 0-10  Safety/Judgment  remain free of injury, prevent falls with cues and reminders   Therapy Plan: PT Intensity: Minimum of 1-2 x/day ,45 to 90 minutes PT Frequency: 5 out of 7 days PT Duration Estimated Length of Stay: 7-10 days OT Intensity: Minimum of 1-2 x/day, 45 to 90 minutes OT Frequency: 5 out of 7 days OT Duration/Estimated Length of Stay: 7-10 SLP Intensity: Minumum of 1-2 x/day, 30 to 90 minutes SLP Frequency: 3 to 5 out of 7 days SLP Duration/Estimated Length of Stay: 7-10 days    Due to the current state of emergency, patients may not be receiving their 3-hours of Medicare-mandated therapy.   Team Interventions: Nursing Interventions Patient/Family Education, Pain Management, Medication Management, Bladder Management, Bowel Management, Psychosocial Support, Discharge Planning, Disease Management/Prevention  PT interventions Ambulation/gait training, Discharge planning, Functional mobility training, Psychosocial support, Therapeutic Activities, Visual/perceptual remediation/compensation, Balance/vestibular training, Disease management/prevention, Neuromuscular re-education, Skin care/wound management, Therapeutic Exercise, Wheelchair propulsion/positioning, Cognitive remediation/compensation, DME/adaptive equipment instruction, Pain management, Splinting/orthotics, UE/LE Strength taining/ROM, Community reintegration, Technical sales engineer stimulation, Patient/family education, IT trainer, UE/LE Coordination activities  OT Interventions Training and development officer, Discharge planning, Pain management, Self Care/advanced ADL retraining, Therapeutic Activities, UE/LE Coordination activities, Visual/perceptual remediation/compensation, Therapeutic Exercise, Skin care/wound managment, Patient/family education, Functional mobility training, Disease mangement/prevention, Cognitive remediation/compensation, Community reintegration, Engineer, drilling, Neuromuscular re-education, Psychosocial support, Splinting/orthotics, UE/LE Strength taining/ROM  SLP Interventions Cognitive remediation/compensation, Cueing hierarchy, Functional tasks, Internal/external aids  TR Interventions    SW/CM Interventions Psychosocial Support, Discharge Planning, Patient/Family Education   Barriers to Discharge MD  Medical stability and Visual impairment  Nursing      PT Inaccessible home environment, Decreased caregiver support, Home environment access/layout, Lack of/limited family  support, Insurance for SNF coverage SO not available 24/7 per patient  OT Decreased caregiver support, Lack of/limited family support    SLP Decreased caregiver support    SW Medical stability     Team Discharge Planning: Destination: PT-Home ,OT- Home , SLP-Home Projected Follow-up: PT-Home health PT,  OT-  Home health OT, SLP-Home Health SLP, Other (comment)(TBD for safety, but HH for dysarthria) Projected Equipment Needs: PT-Rolling walker with 5" wheels, To be determined, OT- Tub/shower bench, 3 in 1 bedside comode, SLP-None recommended by SLP Equipment Details: PT- , OT-  Patient/family involved in discharge planning: PT- Patient,  OT-Patient, SLP-Patient, Family member/caregiver  MD ELOS: 7-10d Medical Rehab Prognosis:  Good Assessment:  69 year old male with history of multiple CVAs with residual mild , R-HH and aphasia, T2DM, HTN, gout, CAF- on Eliquis, polysubstance abuse in the past, PE who was admitted to [facility] on 09/12/19 with HA dizziness and blurred vision. 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  MRI brain done revealing acute cortical/subcortical infarct in posteromedial right temporal and right occipital lobe and chronic infarcts in left frontoparietal, left occipital and superior right cerebellum.  2D echo showed EF 40-40% with mild LVH and grade I DD.  Dr. Doy Mince recommended management of stroke risk factors.  Arterial dopplers showed left Baker's cyst posterior knee and near normal exam.   Dr. Ubaldo Glassing consulted for input on transient bradycardia and recommended holding metoprolol or consider change to coreg in case of tachyrhythmia and holter monitor wor work up after discharge.  Dr. Harrow/opthalmology consulted for input on worsening of vision and felt that changes due to new left  HH combined with chronic R-HH as eye exam WNL. ST evaluation showed moderate dysarthria with word finding deficits --no follow up recommended as felt to be baseline. Patient with resultant visual deficits with left inattention and balance deficits affecting ADLs and mobility. CIR recommended due to functional decline.    Now requiring 24/7 Rehab RN,MD, as well as CIR level PT, OT and SLP.  Treatment team will focus on ADLs and mobility with goals set at Sup/Mod I   See Team Conference Notes for weekly updates to the plan of care

## 2019-09-20 NOTE — Progress Notes (Signed)
Occupational Therapy Session Note  Patient Details  Name: [name] MRN: [id number] Date of Birth: [birth year]  Today's Date: 09/20/2019 OT Individual Time: 1000-1110 OT Individual Time Calculation (min): 70 min    Short Term Goals: Week 1:  OT Short Term Goal 1 (Week 1): STG=LTG d/t ELOS  Skilled Therapeutic Interventions/Progress Updates:    Upon entering the room, pt supine in bed and sleeping soundly. Pt is agreeable to OT intervention with no c/o pain this session. Pt requesting to "wash up at sink" this session. Pt reports that at home he takes a shower every other day and washes at sink in between. Pt ambulating with RW to sink with min guard. Pt bathing with sit <>stand from wheelchair and min A needed for standing balance during LB clothing management. Pt returning to sitting on EOB to apply lotion and B socks. His girlfriend arrived at end of session and therapist requested she bring patient's clothing next time she visits. She verbalized understanding. OT also discussed goals and possible equipment needs with plans to look at [facility] tomorrow. Pt left supine in bed with caregiver present in the room. Bed alarm activated and call bell within reach.   Therapy Documentation Precautions:  Precautions Precautions: Fall Restrictions Weight Bearing Restrictions: No   Pain: Pain Assessment Pain Scale: 0-10 Pain Score: 4  Pain Type: Chronic pain Pain Location: Leg Pain Orientation: Right;Medial;Lower Pain Descriptors / Indicators: Aching;Discomfort Pain Frequency: Constant Pain Onset: On-going Patients Stated Pain Goal: 3 Pain Intervention(s): Medication (See eMAR) ADL: ADL Grooming: Minimal assistance Where Assessed-Lower Body Bathing: Standing at sink Upper Body Dressing: Supervision/safety Where Assessed-Upper Body Dressing: Sitting at sink Lower Body Dressing: Minimal assistance Where Assessed-Lower Body Dressing: Sitting at sink, Standing at sink Toilet Transfer:  Minimal assistance Science writer: Bedside commode, Grab bars Social research officer, government: Minimal assistance Social research officer, government Method: Heritage manager: Shower seat with back   Therapy/Group: Individual Therapy  Gypsy Decant 09/20/2019, 12:40 PM

## 2019-09-20 NOTE — Progress Notes (Signed)
Speech Language Pathology Daily Session Note  Patient Details  Name: Ebin Palazzi MRN: [id number] Date of Birth: [birth year]  Today's Date: 09/20/2019 SLP Individual Time: 0805-0830 SLP Individual Time Calculation (min): 25 min  Short Term Goals: Week 1: SLP Short Term Goal 1 (Week 1): STG=LTG due to short ELOS  Skilled Therapeutic Interventions: Skilled ST services focused on cognitive skills. SLP facilitated education of vision strategies, teaching "lighthouse" strategy and tracing borders with finger to aid in scanning left to see complete "picture". Pt required mod A verbal cues fade to min A verbal cues to utilize vision strategies, however continued to miss sections of large print pictures and words due to blurred vision. SLP and pt communicated about ways to compensate for vision deficits and need for assistance for higher level task like money and medication management. Pt required supervision A verbal cues to utilize phrase level during communication, in which demonstrated 70% intelligibility with min A verbal cues for over articulation and slowed rate. SLP recommends to continued skilled [place] services. Pt was left with call bell within reach and bed alarm set.     Pain Pain Assessment Pain Scale: 0-10 Pain Score: 0-No pain Pain Type: Chronic pain Pain Location: Leg Pain Orientation: Right;Medial;Lower Pain Descriptors / Indicators: Aching;Discomfort Pain Frequency: Constant Pain Onset: On-going Patients Stated Pain Goal: 3 Pain Intervention(s): Medication (See eMAR)  Therapy/Group: Individual Therapy  Muath Hallam  [facility] 09/20/2019, 12:49 PM

## 2019-09-20 NOTE — Progress Notes (Signed)
Physical Therapy Session Note  Patient Details  Name: [name] MRN: [id number] Date of Birth: 06/08/1950  Today's Date: 09/20/2019 PT Individual Time: 6606-0045 PT Individual Time Calculation (min): 68 min   Short Term Goals: Week 1:  PT Short Term Goal 1 (Week 1): STG=LTG due to ELOS  Skilled Therapeutic Interventions/Progress Updates:   Pt received supine in bed and agreeable to PT. Supine>sit transfer without assistor cues  From PT. Stand pivot transfer to [facility] with RW and supervision assist for safety and min cues for AD management. Pt transported to rehab gym in [facility].   Gait training with RW x 171f with and CGA for safety, min cues for increased step height on the R in turns to reduce fall risk. Dynamic gait training in parallel bars forward/reverse 4 x853feach direction and side stepping R and L 4 x [id number] with cues for step height on the R as well as improved step length bil and improved posture. Additional gait training with SPC in the RUE x [id number] CGA, min cues for increased lateral placement of AD to prevent hitting cane with RLE on advancement. Dynamic gait training with SPC to weave through 8 cones x 2 with CGA as well as stepping over 3 obstacles (1-2"). With min assist for safety and improved sequencing.   Dynamic balance training while engaged in visual recongnition task of lateral reach to obtain bean bags and sort into colors. Pt able to pick bean bags up from floor with CGA and 1 UE supported on RW. Dynavision program A, 1 min x3 (14/15/15); supervision assist for balance and mi ncues for improved visual scanning in all quadrants. Patient returned to room and [facility] reach and all needs met.        Therapy Documentation Precautions:  Precautions Precautions: Fall Restrictions Weight Bearing Restrictions: No Vital Signs: Therapy Vitals Temp: 97.6 F (36.4 C) Temp Source: Oral Pulse Rate: 60 Resp: 16 BP: 118/70 Patient Position  (if appropriate): Sitting Oxygen Therapy SpO2: 98 % O2 Device: Room Air Pain: Pain Assessment Pain Score: 0-No pain    Therapy/Group: Individual Therapy  AustLorie Phenix/2021, 3:23 PM

## 2019-09-21 ENCOUNTER — Inpatient Hospital Stay (HOSPITAL_COMMUNITY): Payer: Medicare Other

## 2019-09-21 ENCOUNTER — Inpatient Hospital Stay (HOSPITAL_COMMUNITY): Payer: Medicare Other | Admitting: Occupational Therapy

## 2019-09-21 ENCOUNTER — Inpatient Hospital Stay (HOSPITAL_COMMUNITY): Payer: Medicare Other | Admitting: Physical Therapy

## 2019-09-21 MED ORDER — DICLOFENAC SODIUM 1 % EX GEL
2.0000 g | Freq: Four times a day (QID) | CUTANEOUS | Status: DC
  Administered 2019-09-21 – 2019-09-27 (×24): 2 g via TOPICAL
  Filled 2019-09-21: qty 100

## 2019-09-21 NOTE — Plan of Care (Signed)
  Problem: Consults Goal: RH STROKE PATIENT EDUCATION Description: See Patient Education module for education specifics  Outcome: Progressing   Problem: RH BOWEL ELIMINATION Goal: RH STG MANAGE BOWEL WITH ASSISTANCE Description: STG Manage Bowel with mod I Assistance. Outcome: Progressing   Problem: RH SAFETY Goal: RH STG ADHERE TO SAFETY PRECAUTIONS W/ASSISTANCE/DEVICE Description: STG Adhere to Safety Precautions With cues and reminders Outcome: Progressing   Problem: RH PAIN MANAGEMENT Goal: RH STG PAIN MANAGED AT OR BELOW PT'S PAIN GOAL Description: Pain level less than 4 on scale of 0-10 Outcome: Progressing   Problem: RH KNOWLEDGE DEFICIT Goal: RH STG INCREASE KNOWLEDGE OF HYPERTENSION Description: Pt will be able to adhere to medication regimen and dietary modification to better control blood pressure and prevent stroke with mod I assist upon discharge.  Outcome: Progressing Goal: RH STG INCREASE KNOWLEGDE OF HYPERLIPIDEMIA Description: Pt will be able to adhere to medication regimen and dietary modification to better control cholesterol levels with mod I assist upon discharge.  Outcome: Progressing Goal: RH STG INCREASE KNOWLEDGE OF STROKE PROPHYLAXIS Description: Pt will be able to adhere to medication regimen and dietary modification to better control blood pressure and prevent stroke with mod I assist upon discharge.  Outcome: Progressing

## 2019-09-21 NOTE — Progress Notes (Signed)
Physical Therapy Session Note  Patient Details  Name: [name] MRN: [id number] Date of Birth: 01/19/1951  Today's Date: 09/21/2019 PT Individual Time: 1346-1500 PT Individual Time Calculation (min): 74 min   Short Term Goals: Week 1:  PT Short Term Goal 1 (Week 1): STG=LTG due to ELOS  Skilled Therapeutic Interventions/Progress Updates:   Pt received supine in bed and agreeable to PT. Supine>sit transfer without assist or cues.   PT instructed pt in gait training through hall with [place] x 193f + [id number] [id number] supervision assist-CGA for clothing management. Improved AD management noted on this day compared to previous sessions, with no LOB to kicking of cane on the R. Dynamic gait training through obstacles course to simulate gait in community environment with SPC to walk over unlevel foam mat, over 1inch canes, and up/down 4inch curb step. Completed x 6 with CGA-supervision assist and min cues for AD management as well as attention to L side of obstacles when noted to veer to R.   Dynamic balance training/fine motor control to place and remove clothes pins on horizontal rails with cross body reach x 15 BUE, as well as place clothes pins on basketball net with lateral reach x 10 BUE; pt able to maintain balance for clothes pin fine motor task while standing on red wedge with CGA-supervision assist.   Kinetron seated march 30cm/sec 2 x 1 min and standing 2 x 45 sec at 20cm/sec. CGA from PT and cues for full ROM in sitting and standing. Patient returned to room and [facility] and all needs met.         Therapy Documentation Precautions:  Precautions Precautions: Fall Restrictions Weight Bearing Restrictions: No    Vital Signs: Therapy Vitals Temp: 97.6 F (36.4 C) Temp Source: Oral Pulse Rate: 84 Resp: 18 BP: 130/85 Patient Position (if appropriate): Sitting Oxygen Therapy SpO2: 100 % O2 Device: Room  Air Pain: denies  Therapy/Group: Individual Therapy  AusLorie Phenix7/2021, 3:28 PM

## 2019-09-21 NOTE — Progress Notes (Signed)
Occupational Therapy Session Note  Patient Details  Name: [name] MRN: [id number] Date of Birth: 14-Feb-1951  Today's Date: 09/21/2019 OT Individual Time: 1015-1045 OT Individual Time Calculation (min): 30 min    Short Term Goals: Week 1:  OT Short Term Goal 1 (Week 1): STG=LTG d/t ELOS  Skilled Therapeutic Interventions/Progress Updates:    Pt resting in bed upon arrival and agreeable to therapy.  OT intervention with focus on visual deficits compensatory strategies, functional amb with SPC, and toileiting.  Pt amb in room with SPC at supervision level.  Pt requested use of toilet and amb with SPC to bathroom.  Pt completed all toleting tasks at supervision level.  Pt returned to room and stood at sink to wash hands. Min verbal cues for scanning strategies to locate items in L visual field. Pt returned to bed and remained in bed with all needs within reach and bed alarm activated.   Therapy Documentation Precautions:  Precautions Precautions: Fall Restrictions Weight Bearing Restrictions: No   Pain: Pain Assessment Pain Scale: 0-10 Pain Score: 8  Pain Type: Chronic pain Pain Location: Leg Pain Orientation: Right Pain Descriptors / Indicators: Stabbing Pain Frequency: Intermittent Pain Onset: Progressive Patients Stated Pain Goal: 2 Pain Intervention(s): Pt premedicated and repositioned  Therapy/Group: Individual Therapy  Leroy Libman 09/21/2019, 10:51 AM

## 2019-09-21 NOTE — Progress Notes (Signed)
Occupational Therapy Session Note  Patient Details  Name: [name] MRN: [id number] Date of Birth: 27-Apr-1951  Today's Date: 09/21/2019 OT Individual Time: 3267-1245 OT Individual Time Calculation (min): 56 min   Short Term Goals: Week 1:  OT Short Term Goal 1 (Week 1): STG=LTG d/t ELOS  Skilled Therapeutic Interventions/Progress Updates:    Pt greeted EOB after breakfast. Increased time required for word finding in order for pt to tell OT about his increased Rt LE pain. He received pain medicine this AM, wanted something PRN as pain was still an 8/10. RN in during session to provide tylenol. Pt declined shower as he did not sleep well last night and wanted to be mindful of not increasing his pain. Stand pivot<w/c completed with CGA and no AD. He then completed bathing/dressing tasks w/c level sit<stand at the sink. He needed min vcs for motor planning and also cuing for incorporating hemi techniques, dressing Rt side first. Note that he needed increased time to meet FM demands of grooming tasks with the Rt hand. We worked on recognizing shapes of grooming containers (I.e. deoderant and lotion bottle) vs trying to read labels to compensate for visual deficits. Per pt, he "can't see anything," Lt eye reportedly worse than the Rt. Discussed using head turns to compensate as well. CGA for dynamic standing balance during LB self care. He was also able to utilize seated figure 4 position functionally for these tasks. Min cuing during oral care completion w/c level afterwards. Stand pivot<bed completed with CGA and pt returned to supine unassisted. Left him with all needs within reach and bed alarm set.    Therapy Documentation Precautions:  Precautions Precautions: Fall Restrictions Weight Bearing Restrictions: No ADL: ADL Grooming: Minimal assistance Where Assessed-Lower Body Bathing: Standing at sink Upper Body Dressing: Supervision/safety Where Assessed-Upper Body Dressing: Sitting at  sink Lower Body Dressing: Minimal assistance Where Assessed-Lower Body Dressing: Sitting at sink, Standing at sink Toilet Transfer: Minimal assistance Toilet Transfer Equipment: Bedside commode, Emergency planning/management officer Transfer: Minimal assistance Social research officer, government Method: Heritage manager: Shower seat with back      Therapy/Group: Individual Therapy  Josie Mesa A Jaretzy Lhommedieu 09/21/2019, 12:14 PM

## 2019-09-21 NOTE — Progress Notes (Addendum)
[facility] PHYSICAL MEDICINE & REHABILITATION PROGRESS NOTE   Subjective/Complaints: Pt feels vision worse in ams, discussed bilateral strokes affecting visual cortex of brain RIght foot and ankle pain  Review of systems negative per chest pain shortness of breath nausea vomiting diarrhea  Objective:   No results found. Recent Labs    09/19/19 0716  WBC 3.4*  HGB 14.9  HCT 47.2  PLT 196   Recent Labs    09/19/19 0716  NA 134*  K 5.1  CL 103  CO2 24  GLUCOSE 121*  BUN 35*  CREATININE 1.37*  CALCIUM 9.5    Intake/Output Summary (Last 24 hours) at 09/21/2019 0815 Last data filed at 09/21/2019 0805 Gross per 24 hour  Intake 1196 ml  Output 450 ml  Net 746 ml     Physical Exam: Vital Signs Blood pressure (!) 148/87, pulse 73, temperature 97.7 F (36.5 C), temperature source Oral, resp. rate 18, height 6\' 2"  (1.88 m), weight 92.6 kg, SpO2 100 %.     General: No acute distress Mood and affect are appropriate Heart: Regular rate and rhythm no rubs murmurs or extra sounds Lungs: Clear to auscultation, breathing unlabored, no rales or wheezes Abdomen: Positive bowel sounds, soft nontender to palpation, nondistended Extremities: No clubbing, cyanosis, or edema Skin: No evidence of breakdown, no evidence of rash Left neglect     Musculoskeletal:gouty tophi PIPs RIght hand No Right foot or ankle swelling . Pain with R foot and ankle ROM    Assessment/Plan: 1. Functional deficits secondary to acute R PCA infarct which require 3+ hours per day of interdisciplinary therapy in a comprehensive inpatient rehab setting.  Physiatrist is providing close team supervision and 24 hour management of active medical problems listed below.  Physiatrist and rehab team continue to assess barriers to discharge/monitor patient progress toward functional and medical goals  Care Tool:  Bathing    Body parts bathed by patient: Right arm, Left arm, Chest, Abdomen, Front perineal  area, Buttocks, Right upper leg, Left upper leg, Right lower leg, Left lower leg, Face         Bathing assist Assist Level: Minimal Assistance - Patient > 75%     Upper Body Dressing/Undressing Upper body dressing   What is the patient wearing?: Pull over shirt    Upper body assist Assist Level: Set up assist    Lower Body Dressing/Undressing Lower body dressing      What is the patient wearing?: Pants, Underwear/pull up     Lower body assist Assist for lower body dressing: Contact Guard/Touching assist     Toileting Toileting    Toileting assist Assist for toileting: Independent with assistive device Assistive Device Comment: urinal   Transfers Chair/bed transfer  Transfers assist     Chair/bed transfer assist level: Contact Guard/Touching assist     Locomotion Ambulation   Ambulation assist      Assist level: Contact Guard/Touching assist Assistive device: Walker-rolling Max distance: 15'   Walk 10 feet activity   Assist     Assist level: Contact Guard/Touching assist Assistive device: No Device   Walk 50 feet activity   Assist    Assist level: Moderate Assistance - Patient - 50 - 74% Assistive device: No Device    Walk 150 feet activity   Assist Walk 150 feet activity did not occur: Safety/medical concerns         Walk 10 feet on uneven surface  activity   Assist     Assist level:  Moderate Assistance - Patient - 50 - 74%     Wheelchair     Assist Will patient use wheelchair at discharge?: Yes Type of Wheelchair: Manual    Wheelchair assist level: Minimal Assistance - Patient > 75% Max wheelchair distance: 150    Wheelchair 50 feet with 2 turns activity    Assist        Assist Level: Minimal Assistance - Patient > 75%   Wheelchair 150 feet activity     Assist      Assist Level: Minimal Assistance - Patient > 75%   Blood pressure (!) 148/87, pulse 73, temperature 97.7 F (36.5 C), temperature  source Oral, resp. rate 18, height 6\' 2"  (1.88 m), weight 92.6 kg, SpO2 100 %.  Medical Problem List and Plan: 1.  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  PE/DVT hx/Antithrombotics: -anticoagulation:  Pharmaceutical: Other (comment)--Eliquis             -antiplatelet therapy:   Stroke felt to be embolic--add ASA if patient has been compliant with Eliquis per neuro input. Will discuss with patient. .  3. Chronic pain/Pain Management: Continue flexeril prn and lidocaine patches for local measures.  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 Mood: LCSW to follow for evaluation and support.              -antipsychotic agents:  5. Neuropsych: This patient is capable of making decisions on his own behalf. 6. Skin/Wound Care: Routine pressure relief measures.  7. Fluids/Electrolytes/Nutrition: Monitor I/O. Check lytes in am.  8. CAF: Monitor HR TID.  9. UUE:KCMKLKJ TID --continue low dose metoprolol and amlodipine. Was also on furosemide and Cozaar PTA.  Vitals:   09/20/19 2051 09/21/19 0608  BP: (!) 128/93 (!) 148/87  Pulse: 81 73  Resp: 17 18  Temp: (!) 97.5 F (36.4 C) 97.7 F (36.5 C)  SpO2: 100% 100%  controlled 5/7 10. Neutropenia: Question transient --recheck in am.  11.  Right thyroid nodule: will need follow up ultrasound.  12.  H/o  Tophaceous gout: Continue colchicine  13. Hypokalemia: Has resolved--decrease supplement to once a day.  14. T2DM: Hgb A1c- 6.2 and is diet controlled, but pt says eats cookies/candy daily  CBG (last 3)  Recent Labs    09/18/19 1243  GLUCAP 96    15. 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

## 2019-09-21 NOTE — Progress Notes (Signed)
Speech Language Pathology Daily Session Note  Patient Details  Name: [name] MRN: [id number] Date of Birth: 1951/01/13  Today's Date: 09/21/2019 SLP Individual Time: 1135-1202 SLP Individual Time Calculation (min): 27 min  Short Term Goals: Week 1: SLP Short Term Goal 1 (Week 1): STG=LTG due to short ELOS  Skilled Therapeutic Interventions: Skilled ST services focused on speech skills. SLP facilitated speech intelligibility strategies targeting precise articulation at word and simple sentence level utilizing 3 and 4 syllable words. Pt demonstrated use of strategies (slow rate and over articulation) with 3 syllable words/simple sentences with 85% accuracy given min A verbal cues and 4 syllable word/ simple sentences with 80% accuracy given mod A verbal cues. Pt demonstrated ability to express wants/needs with 60% intelligibility, characterized by halting speech with min A verbal cues to utilize strategies.  Pt was left in room with call bell within reach and bed alarm set. ST recommends to continue skilled ST services.      Pain Pain Assessment Pain Scale: 0-10 Pain Score: 2  Pain Type: Chronic pain Pain Location: Leg Pain Orientation: Right Pain Descriptors / Indicators: Aching Pain Frequency: Intermittent Pain Onset: On-going Patients Stated Pain Goal: 3 Pain Intervention(s): Medication (See eMAR)  Therapy/Group: Individual Therapy  Erskine Steinfeldt  [facility] 09/21/2019, 7:50 AM

## 2019-09-22 ENCOUNTER — Inpatient Hospital Stay (HOSPITAL_COMMUNITY): Payer: Medicare Other | Admitting: Occupational Therapy

## 2019-09-22 ENCOUNTER — Inpatient Hospital Stay (HOSPITAL_COMMUNITY): Payer: Medicare Other | Admitting: Speech Pathology

## 2019-09-22 ENCOUNTER — Inpatient Hospital Stay (HOSPITAL_COMMUNITY): Payer: Medicare Other | Admitting: Physical Therapy

## 2019-09-22 MED ORDER — COLCHICINE 0.6 MG PO TABS
0.6000 mg | ORAL_TABLET | Freq: Every day | ORAL | Status: DC
  Administered 2019-09-22 – 2019-09-27 (×6): 0.6 mg via ORAL
  Filled 2019-09-22 (×6): qty 1

## 2019-09-22 NOTE — Progress Notes (Signed)
[facility] PHYSICAL MEDICINE & REHABILITATION PROGRESS NOTE   Subjective/Complaints: Still c/o vision. No different from yesterday. Has left shoulder and knee pain. C/o rigth leg pain yesterday  ROS: Patient denies fever, rash, sore throat,  nausea, vomiting, diarrhea, cough, shortness of breath or chest pain,  headache, or mood change.   Objective:   DG Ankle 2 Views Right  Result Date: 09/21/2019 CLINICAL DATA:  Right ankle pain. Gout. EXAM: RIGHT ANKLE - 2 VIEW COMPARISON:  None. FINDINGS: There is slight arthritic changes at the ankle joint a tiny old avulsions of the tips of the medial and lateral malleoli. Prominent Haglund deformity of the posterior aspect of the calcaneus at the Achilles insertion consistent with chronic Achilles tendinopathy. Slight dorsal spurring in the midfoot. Extensive bizarre appearing soft tissue calcification in the posterior and medial aspects of the distal lower leg. Smaller calcifications are seen anteriorly and laterally in the soft tissues. IMPRESSION: 1. Slight arthritic changes of the ankle joint. 2. Chronic Achilles tendinopathy. 3. Extensive appearing soft tissue calcification in the distal lower leg may be due to gout. The dermatomyositis or polymyositis or remote thermal burn could also give this appearance. The patient also has a history of deep venous thrombosis but this calcification is not typical for venous stasis disease. 4. No acute bone abnormality. Electronically Signed   By: Lorriane Shire M.D.   On: 09/21/2019 10:11   DG Foot Complete Right  Result Date: 09/21/2019 CLINICAL DATA:  69 year old male with a history of gout and pain at the right foot EXAM: RIGHT FOOT COMPLETE - 3+ VIEW COMPARISON:  None. FINDINGS: Dystrophic calcifications within the posterior soft tissues of the distal calf, incompletely imaged. Enthesopathic changes at the Achilles insertion and plantar fascia insertion. Partial fusion of the tarsal bones as well as the  tarsometatarsal joints of the first, second, third ray. Subchondral sclerosis of the first metatarsophalangeal joint with overhanging edges. No soft tissue calcifications at the first ray or first digit. Marginal osteophyte formation at the IP joint. Joint space narrowing of the remainder of the interphalangeal joints. Focal soft tissue swelling associated with the fifth toe/fifth metatarsophalangeal joint. IMPRESSION: Arthritic changes at the first metatarsophalangeal joint with mild soft tissue change, may indicate chronic changes of gout given the history. Nonspecific soft tissue swelling of the fifth toe and fifth metatarsophalangeal joint without significant arthritic change at the fifth metatarsophalangeal joint. Partial ankylosis of the tarsal bones and the tarsometatarsal joints of first, second, third ray. Degenerative changes of the IP. Dystrophic soft tissue calcifications of the distal posterior calf, incompletely imaged. Electronically Signed   By: Corrie Mckusick D.O.   On: 09/21/2019 09:49   No results for input(s): WBC, HGB, HCT, PLT in the last 72 hours. No results for input(s): NA, K, CL, CO2, GLUCOSE, BUN, CREATININE, CALCIUM in the last 72 hours.  Intake/Output Summary (Last 24 hours) at 09/22/2019 0917 Last data filed at 09/22/2019 0425 Gross per 24 hour  Intake --  Output 830 ml  Net -830 ml     Physical Exam: Vital Signs Blood pressure 118/86, pulse 66, temperature 97.6 F (36.4 C), temperature source Axillary, resp. rate 16, height 6\' 2"  (1.88 m), weight 92.6 kg, SpO2 100 %.     General: No acute distress Mood and affect are appropriate Heart: Regular rate and rhythm no rubs murmurs or extra sounds Lungs: Clear to auscultation, breathing unlabored, no rales or wheezes Abdomen: Positive bowel sounds, soft nontender to palpation, nondistended Extremities: No clubbing, cyanosis,  or edema Skin: No evidence of breakdown, no evidence of rash Left neglect      Musculoskeletal:gouty tophi PIPs RIght hand No Right foot or ankle swelling . Pain with R foot and ankle ROM    Assessment/Plan: 1. Functional deficits secondary to acute R PCA infarct which require 3+ hours per day of interdisciplinary therapy in a comprehensive inpatient rehab setting.  Physiatrist is providing close team supervision and 24 hour management of active medical problems listed below.  Physiatrist and rehab team continue to assess barriers to discharge/monitor patient progress toward functional and medical goals  Care Tool:  Bathing    Body parts bathed by patient: Right arm, Left arm, Chest, Abdomen, Front perineal area, Buttocks, Right upper leg, Left upper leg, Right lower leg, Left lower leg, Face         Bathing assist Assist Level: Contact Guard/Touching assist     Upper Body Dressing/Undressing Upper body dressing   What is the patient wearing?: Pull over shirt    Upper body assist Assist Level: Supervision/Verbal cueing    Lower Body Dressing/Undressing Lower body dressing      What is the patient wearing?: Pants, Incontinence brief     Lower body assist Assist for lower body dressing: Moderate Assistance - Patient 50 - 74%     Toileting Toileting    Toileting assist Assist for toileting: Independent Assistive Device Comment: urinal   Transfers Chair/bed transfer  Transfers assist     Chair/bed transfer assist level: Supervision/Verbal cueing     Locomotion Ambulation   Ambulation assist      Assist level: Supervision/Verbal cueing Assistive device: Cane-straight Max distance: 250   Walk 10 feet activity   Assist     Assist level: Supervision/Verbal cueing Assistive device: Cane-straight   Walk 50 feet activity   Assist    Assist level: Supervision/Verbal cueing Assistive device: Cane-straight    Walk 150 feet activity   Assist Walk 150 feet activity did not occur: Safety/medical concerns  Assist  level: Supervision/Verbal cueing Assistive device: Cane-straight    Walk 10 feet on uneven surface  activity   Assist     Assist level: Contact Guard/Touching assist     Wheelchair     Assist Will patient use wheelchair at discharge?: Yes Type of Wheelchair: Manual    Wheelchair assist level: Minimal Assistance - Patient > 75% Max wheelchair distance: 150    Wheelchair 50 feet with 2 turns activity    Assist        Assist Level: Minimal Assistance - Patient > 75%   Wheelchair 150 feet activity     Assist      Assist Level: Minimal Assistance - Patient > 75%   Blood pressure 118/86, pulse 66, temperature 97.6 F (36.4 C), temperature source Axillary, resp. rate 16, height 6\' 2"  (1.88 m), weight 92.6 kg, SpO2 100 %.  Medical Problem List and Plan: 1.  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  PE/DVT hx/Antithrombotics: -anticoagulation:  Pharmaceutical: Other (comment)--Eliquis             -antiplatelet therapy:   Stroke felt to be embolic--add ASA if patient has been compliant with Eliquis per neuro input. Will discuss with patient. [name]  3. Chronic pain/Pain Management: Continue flexeril prn and lidocaine patches for local measures.   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 Mood: LCSW to follow for evaluation and support.              -antipsychotic agents:  5. Neuropsych: This patient is capable of making decisions on his own behalf. 6. Skin/Wound Care: Routine pressure relief measures.  7. Fluids/Electrolytes/Nutrition: Monitor I/O.    8. CAF: Monitor HR TID.  9. VWP:[id number] TID --continue low dose metoprolol and amlodipine. Was also on furosemide and Cozaar PTA.  Vitals:    09/21/19 2022 09/22/19 0343  BP: 123/86 118/86  Pulse: 62 66  Resp: 18 16  Temp: 97.6 F (36.4 C) 97.6 F (36.4 C)  SpO2: 100% 100%  controlled 5/8 10. Neutropenia: Question transient -- 11.  Right thyroid nodule: will need follow up ultrasound.  12.  H/o  Tophaceous gout: Continue colchicine  13. Hypokalemia: Has resolved--decrease supplement to once a day.  14. T2DM: Hgb A1c- 6.2 and is diet controlled, but pt says eats cookies/candy daily  CBG (last 3)  No results for input(s): GLUCAP in the last 72 hours.  15. 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

## 2019-09-22 NOTE — Progress Notes (Signed)
Speech Language Pathology Daily Session Note  Patient Details  Name: [name] MRN: [id number] Date of Birth: 07/10/1950  Today's Date: 09/22/2019 SLP Individual Time: 1131-1200 SLP Individual Time Calculation (min): 29 min  Short Term Goals: Week 1: SLP Short Term Goal 1 (Week 1): STG=LTG due to short ELOS  Skilled Therapeutic Interventions: Pt was seen for skilled ST targeting speech and memory goals. Pt able to recall activities of last ST session with only a question prompt. He required Min A verbal cues to recall main speech intelligibility strategies. Pt produced 3-syllable words and 4-5 word phrases with 90% ineligibility. Min A verbal cues quickly faded do supervision for use of slower rate. Pt self corrected verbal errors during structured speech tasks, however increased cueing (~Min) is required for intelligibility (~80-85%) at the sentence and conversation levels during informal tasks. Pt left sitting in wheelchair with RN present, needs within reach, and NT aware pt would like to bathe at sink. Continue per current plan of care.         Pain Pain Assessment Pain Scale: Faces Faces Pain Scale: No hurt    Therapy/Group: Individual Therapy  Arbutus Leas 09/22/2019, 7:13 AM

## 2019-09-22 NOTE — Progress Notes (Signed)
Occupational Therapy Session Note  Patient Details  Name: [name] MRN: [id number] Date of Birth: 02/16/1951  Today's Date: 09/22/2019 OT Individual Time: 1300-1429 OT Individual Time Calculation (min): 89 min   Short Term Goals: Week 1:  OT Short Term Goal 1 (Week 1): STG=LTG d/t ELOS  Skilled Therapeutic Interventions/Progress Updates:    Pt greeted in his w/c, finishing up lunch. Pt premedicated for Rt LE pain and declined showering. Started session by doffing his gripper socks and donning his compression socks + sneakers. Pt able to meet demands of task with supervision for dynamic balance and increased time. Transitioned to the tub shower room where OT demonstrated TTB transfer technique. Pt exhibited carryover of education after when he practiced this transfer using SPC with CGA. Discussed purchase of nonslip shower treads and placement of shower curtain to prevent water spillage. Pt reports he already has a vertical grab bar installed in his shower. To work on higher level dynamic standing balance, escorted pt via w/c to the outdoor patio. Pt ambulated over uneven terrain using SPC with CGA, transferred to a community bench. He also ambulated in the food court area of the atrium using device, completing transfers in and out of booth, and scooting in booth with CGA-supervision assistance. Pt was then returned to room via w/c and completed stand pivot<bed with CGA and no AD. He removed his sneakers and compression socks by himself and then donned gripper socks. Pt able to transition to supine without bedrail use per setup at home. He remained in bed with all needs within reach and bed alarm set.   Therapy Documentation Precautions:  Precautions Precautions: Fall Restrictions Weight Bearing Restrictions: No Vital Signs: Therapy Vitals Temp: 97.8 F (36.6 C) Pulse Rate: 71 Resp: 18 BP: 121/81 Patient Position (if appropriate): Lying Oxygen Therapy SpO2: 100 % O2 Device: Room  Air Pain: Pain Assessment Pain Scale: Faces Faces Pain Scale: No hurt ADL: ADL Grooming: Minimal assistance Where Assessed-Lower Body Bathing: Standing at sink Upper Body Dressing: Supervision/safety Where Assessed-Upper Body Dressing: Sitting at sink Lower Body Dressing: Minimal assistance Where Assessed-Lower Body Dressing: Sitting at sink, Standing at sink Toilet Transfer: Minimal assistance Toilet Transfer Equipment: Bedside commode, Grab bars Gaffer Transfer: Minimal assistance Social research officer, government Method: Heritage manager: Shower seat with back      Therapy/Group: Individual Therapy  Layne Dilauro A Emmry Hinsch 09/22/2019, 3:44 PM

## 2019-09-22 NOTE — Progress Notes (Signed)
Physical Therapy Session Note  Patient Details  Name: [name] MRN: [id number] Date of Birth: Sep 03, 1950  Today's Date: 09/22/2019 PT Individual Time: 0915-1016 PT Individual Time Calculation (min): 61 min   Short Term Goals: Week 1:  PT Short Term Goal 1 (Week 1): STG=LTG due to ELOS  Skilled Therapeutic Interventions/Progress Updates:    Pt received supine in bed and agreeable to therapy session. Supine>sitting L EOB mod-I. Sit<>stands with and without SPC and close supervision for safety throughout session. Gait training ~26ft to main therapy gym using SPC in R UE with CGA for safety but no significant unsteadiness noted - no instances of kicking cane with R LE. Pt reports his main impairments are speech and L eye vision - pt noted to have R visual field cut in L eye appearing to have more difficulty with right lower quadrant compared to right upper quadrant. Gait training ~22ft while performing higher level dynamic gait challenges of horizontal head turns, sudden start/stops, faster walking, sudden turns R/L, backwards walking, and lateral side stepping all randomly and on sudden verbal command - with CGA for safety. Performed dynamic gait with dual-task of locating items in the room, walking up to touch them, and stating the name of the object outloud all with CGA for safety. Dynamic balance challenge of repeated R/L LE forward stepping on/off 6" step without UE support with CGA/min assist for balance x15-20reps each. Dynamic balance challenge of repeated R/L lateral stepping on/off 6" step without UE support with min assist for balance x10-12 reps each LE. Gait training ~274ft back to room using [facility] with CGA/close supervision for safety. Pt left seated in w/c with needs in reach and seat belt alarm on.  Therapy Documentation Precautions:  Precautions Precautions: Fall Restrictions Weight Bearing Restrictions: No  Pain:   Pt reports L sided pain in arm, trunk and LE - RN notified  for medication administration - this does not limit pt's participation in therapy session.    Therapy/Group: Individual Therapy  Tawana Scale, PT, DPT 09/22/2019, 7:54 AM

## 2019-09-23 NOTE — Plan of Care (Signed)
  Problem: Consults Goal: RH STROKE PATIENT EDUCATION Description: See Patient Education module for education specifics  Outcome: Progressing   Problem: RH BOWEL ELIMINATION Goal: RH STG MANAGE BOWEL WITH ASSISTANCE Description: STG Manage Bowel with mod I Assistance. Outcome: Progressing   Problem: RH SAFETY Goal: RH STG ADHERE TO SAFETY PRECAUTIONS W/ASSISTANCE/DEVICE Description: STG Adhere to Safety Precautions With cues and reminders Outcome: Progressing   Problem: RH PAIN MANAGEMENT Goal: RH STG PAIN MANAGED AT OR BELOW PT'S PAIN GOAL Description: Pain level less than 4 on scale of 0-10 Outcome: Progressing   Problem: RH KNOWLEDGE DEFICIT Goal: RH STG INCREASE KNOWLEDGE OF HYPERTENSION Description: Pt will be able to adhere to medication regimen and dietary modification to better control blood pressure and prevent stroke with mod I assist upon discharge.  Outcome: Progressing Goal: RH STG INCREASE KNOWLEGDE OF HYPERLIPIDEMIA Description: Pt will be able to adhere to medication regimen and dietary modification to better control cholesterol levels with mod I assist upon discharge.  Outcome: Progressing Goal: RH STG INCREASE KNOWLEDGE OF STROKE PROPHYLAXIS Description: Pt will be able to adhere to medication regimen and dietary modification to better control blood pressure and prevent stroke with mod I assist upon discharge.  Outcome: Progressing

## 2019-09-24 ENCOUNTER — Inpatient Hospital Stay (HOSPITAL_COMMUNITY): Payer: Medicare Other | Admitting: Occupational Therapy

## 2019-09-24 ENCOUNTER — Inpatient Hospital Stay (HOSPITAL_COMMUNITY): Payer: Medicare Other | Admitting: Physical Therapy

## 2019-09-24 ENCOUNTER — Inpatient Hospital Stay (HOSPITAL_COMMUNITY): Payer: Medicare Other | Admitting: Speech Pathology

## 2019-09-24 LAB — BASIC METABOLIC PANEL
Anion gap: 12 (ref 5–15)
BUN: 30 mg/dL — ABNORMAL HIGH (ref 8–23)
CO2: 23 mmol/L (ref 22–32)
Calcium: 9.3 mg/dL (ref 8.9–10.3)
Chloride: 99 mmol/L (ref 98–111)
Creatinine, Ser: 1.29 mg/dL — ABNORMAL HIGH (ref 0.61–1.24)
GFR calc Af Amer: >60 mL/min (ref >60)
GFR calc non Af Amer: 56 mL/min — ABNORMAL LOW (ref >60)
Glucose, Bld: 106 mg/dL — ABNORMAL HIGH (ref 70–99)
Potassium: 4.7 mmol/L (ref 3.5–5.1)
Sodium: 134 mmol/L — ABNORMAL LOW (ref 135–145)

## 2019-09-24 LAB — CBC
HCT: 43.6 % (ref 39.0–52.0)
Hemoglobin: 13.8 g/dL (ref 13.0–17.0)
MCH: 25.2 pg — ABNORMAL LOW (ref 26.0–34.0)
MCHC: 31.7 g/dL (ref 30.0–36.0)
MCV: 79.6 fL — ABNORMAL LOW (ref 80.0–100.0)
Platelets: 143 10*3/uL — ABNORMAL LOW (ref 150–400)
RBC: 5.48 MIL/uL (ref 4.22–5.81)
RDW: 16.8 % — ABNORMAL HIGH (ref 11.5–15.5)
WBC: 3.3 10*3/uL — ABNORMAL LOW (ref 4.0–10.5)
nRBC: 0 % (ref 0.0–0.2)

## 2019-09-24 MED ORDER — METOPROLOL TARTRATE 25 MG PO TABS
25.0000 mg | ORAL_TABLET | Freq: Once | ORAL | Status: DC
  Filled 2019-09-24: qty 1

## 2019-09-24 NOTE — Progress Notes (Signed)
Occupational Therapy Session Note  Patient Details  Name: [name] MRN: [id number] Date of Birth: [birth year]  Today's Date: 09/24/2019 OT Individual Time: 4235-3614 and 4315-4008 OT Individual Time Calculation (min): 41 min and 28 mins   Short Term Goals: Week 1:  OT Short Term Goal 1 (Week 1): STG=LTG d/t ELOS  Skilled Therapeutic Interventions/Progress Updates:    Session 1: Upon entering the room, pt seated in wheelchair with breakfast tray in front of him. OT writing down schedule and reviewing with him this morning. Pt with sudden urge for urination but does not feel he can make it to commode. He utilized urinal independently this session. Pt finishing with meal and donning pull over pants with set up A and supervision when standing. Pt ambulating with SPC to standing at sink for grooming tasks with no LOB and supervision only. Pt returning to wheelchair at end of session with chair alarm belt donned and activated. Call bell and all needed items within reach upon exiting the room.   Session 2: Upon entering the room, pt seated in wheelchair with no c/o pain. Pt declines need for toileting this session. Pt standing with mod I and ambulating to ADL apartment with [facility] and supervision. Pt engaged in several transfers similar to home environment including recliner chair, standard bed, and sofa with min cuing for technique and supervision for each one. Pt ambulating on carpeted surface without issue and supervision. Pt demonstrating transfer onto TTB with min cuing for technique and supervision as well. Pt returning back to room with supervision and returning to sit in wheelchair with chair alarm belt donned and activated. Call bell and all needed items within reach.   Therapy Documentation Precautions:  Precautions Precautions: Fall Restrictions Weight Bearing Restrictions: No ADL: ADL Grooming: Minimal assistance Where Assessed-Lower Body Bathing: Standing at sink Upper Body  Dressing: Supervision/safety Where Assessed-Upper Body Dressing: Sitting at sink Lower Body Dressing: Minimal assistance Where Assessed-Lower Body Dressing: Sitting at sink, Standing at sink Toilet Transfer: Minimal assistance Toilet Transfer Equipment: Bedside commode, Emergency planning/management officer Transfer: Minimal assistance Social research officer, government Method: Heritage manager: Shower seat with back   Therapy/Group: Individual Therapy  Gypsy Decant 09/24/2019, 8:30 AM

## 2019-09-24 NOTE — Progress Notes (Signed)
[facility] PHYSICAL MEDICINE & REHABILITATION PROGRESS NOTE   Subjective/Complaints:  Visual field deficits (as expected given CVA location )   ROS:No CP, SOB, N/V/D Objective:   No results found. Recent Labs    09/24/19 0507  WBC 3.3*  HGB 13.8  HCT 43.6  PLT 143*   Recent Labs    09/24/19 0507  NA 134*  K 4.7  CL 99  CO2 23  GLUCOSE 106*  BUN 30*  CREATININE 1.29*  CALCIUM 9.3    Intake/Output Summary (Last 24 hours) at 09/24/2019 0747 Last data filed at 09/24/2019 0324 Gross per 24 hour  Intake 480 ml  Output 600 ml  Net -120 ml     Physical Exam: Vital Signs Blood pressure 122/81, pulse 79, temperature 98.3 F (36.8 C), temperature source Oral, resp. rate 17, height 6\' 2"  (1.88 m), weight 92.6 kg, SpO2 100 %.     General: No acute distress Mood and affect are appropriate Heart: Regular rate and rhythm no rubs murmurs or extra sounds Lungs: Clear to auscultation, breathing unlabored, no rales or wheezes Abdomen: Positive bowel sounds, soft nontender to palpation, nondistended Extremities: No clubbing, cyanosis, or edema Skin: No evidence of breakdown, no evidence of rash Left neglect     Musculoskeletal:gouty tophi PIPs RIght hand No Right foot or ankle swelling . Pain with R foot and ankle ROM    Assessment/Plan: 1. Functional deficits secondary to acute R PCA infarct which require 3+ hours per day of interdisciplinary therapy in a comprehensive inpatient rehab setting.  Physiatrist is providing close team supervision and 24 hour management of active medical problems listed below.  Physiatrist and rehab team continue to assess barriers to discharge/monitor patient progress toward functional and medical goals  Care Tool:  Bathing    Body parts bathed by patient: Right arm, Left arm, Chest, Abdomen, Front perineal area, Buttocks, Right upper leg, Left upper leg, Right lower leg, Left lower leg, Face         Bathing assist Assist Level:  Contact Guard/Touching assist     Upper Body Dressing/Undressing Upper body dressing   What is the patient wearing?: Pull over shirt    Upper body assist Assist Level: Supervision/Verbal cueing    Lower Body Dressing/Undressing Lower body dressing      What is the patient wearing?: Pants, Incontinence brief     Lower body assist Assist for lower body dressing: Moderate Assistance - Patient 50 - 74%     Toileting Toileting    Toileting assist Assist for toileting: Independent Assistive Device Comment: urinal   Transfers Chair/bed transfer  Transfers assist     Chair/bed transfer assist level: Supervision/Verbal cueing     Locomotion Ambulation   Ambulation assist      Assist level: Supervision/Verbal cueing Assistive device: Cane-straight Max distance: 266ft   Walk 10 feet activity   Assist     Assist level: Supervision/Verbal cueing Assistive device: Cane-straight   Walk 50 feet activity   Assist    Assist level: Supervision/Verbal cueing Assistive device: Cane-straight    Walk 150 feet activity   Assist Walk 150 feet activity did not occur: Safety/medical concerns  Assist level: Supervision/Verbal cueing Assistive device: Cane-straight    Walk 10 feet on uneven surface  activity   Assist     Assist level: Contact Guard/Touching assist     Wheelchair     Assist Will patient use wheelchair at discharge?: Yes Type of Wheelchair: Manual    Wheelchair assist level:  Minimal Assistance - Patient > 75% Max wheelchair distance: 150    Wheelchair 50 feet with 2 turns activity    Assist        Assist Level: Minimal Assistance - Patient > 75%   Wheelchair 150 feet activity     Assist      Assist Level: Minimal Assistance - Patient > 75%   Blood pressure 122/81, pulse 79, temperature 98.3 F (36.8 C), temperature source Oral, resp. rate 17, height 6\' 2"  (1.88 m), weight 92.6 kg, SpO2 100 %.  Medical Problem  List and Plan: 1.  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  PE/DVT hx/Antithrombotics: -anticoagulation:  Pharmaceutical: Other (comment)--Eliquis             -antiplatelet therapy:   Stroke felt to be embolic--add ASA if patient has been compliant with Eliquis per neuro input. Will discuss with patient. .  3. Chronic pain/Pain Management: Continue flexeril prn and lidocaine patches for local measures.   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 Mood: LCSW to follow for evaluation and support.              -antipsychotic agents:  5. Neuropsych: This patient is capable of making decisions on his own behalf. 6. Skin/Wound Care: Routine pressure relief measures.  7. Fluids/Electrolytes/Nutrition: Monitor I/O.    8. CAF: Monitor HR TID.  9. HWT:UUEKCMK TID --continue low dose metoprolol and amlodipine. Was also on furosemide and Cozaar PTA.  Vitals:   09/23/19 1947 09/24/19 0306  BP: (!) 129/99 122/81  Pulse: 78 79  Resp: 18 17  Temp: 97.6 F (36.4 C) 98.3 F (36.8 C)  SpO2: 97% 100%  controlled 5/10 10. Neutropenia: Question transient -- 11.  Right thyroid nodule: will need follow up ultrasound.  12.  H/o  Tophaceous gout: Continue colchicine  13. Hypokalemia: Has resolved--decrease supplement to once a day.  14. T2DM: Hgb A1c- 6.2 and is diet controlled, but pt says eats cookies/candy daily  CBG (last 3)  No results for input(s): GLUCAP in the last 72 hours.  15. 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  Elevated BUN , oral intake <1068ml per day enc po fluid  LOS: 6  days A FACE TO [place] Jasleen Riepe 09/24/2019, 7:47 AM

## 2019-09-24 NOTE — Progress Notes (Signed)
Speech Language Pathology Daily Session Note  Patient Details  Name: [name] MRN: [id number] Date of Birth: April 11, 1951  Today's Date: 09/24/2019 SLP Individual Time: 3295-1884 SLP Individual Time Calculation (min): 26 min  Short Term Goals: Week 1: SLP Short Term Goal 1 (Week 1): STG=LTG due to short ELOS  Skilled Therapeutic Interventions: Pt was seen for skilled ST targeting cognitive goals. Pt required overall Min A verbal cues to verbally recall strategies to compensate for left visual field impairments. He used them with Min A verbal cues during functional tasks such as making a phone call and reading his therapy schedule. Pt with excellent verbal recall of speech strategies and other therapy techniques/targets he has been working on this admission. Pt left sitting in bed with alarm est and needs within reach. Continue per current plan of care.        Pain Pain Assessment Pain Scale: 0-10 Pain Score: 8  Pain Type: Acute pain Pain Location: Shoulder Pain Orientation: Left Pain Descriptors / Indicators: Aching Pain Onset: On-going Pain Intervention(s): RN made aware Multiple Pain Sites: No  Therapy/Group: Individual Therapy  Arbutus Leas 09/24/2019, 7:21 AM

## 2019-09-24 NOTE — Progress Notes (Signed)
Physical Therapy Session Note  Patient Details  Name: [name] MRN: [id number] Date of Birth: 1950/11/01  Today's Date: 09/24/2019 PT Individual Time: 0920-1020 and 5830-9407 PT Individual Time Calculation (min): 60 min and 28 min  Short Term Goals: Week 1:  PT Short Term Goal 1 (Week 1): STG=LTG due to ELOS  Skilled Therapeutic Interventions/Progress Updates: Pt presented in w/c agreeable to therapy. Pt states pain 8/10 at R arm/elbow but no intervention required during session. Pt ambulated to rehab gym with [facility] and supervision. Discussed with pt any current barriers/concerns that pt has at d/c. Per pt feels ok except for maneuvering in bathroom (small space and toilet transfers). Performed STS no AD x 5 from lowest mat surface. Pt then performed partial squats 2 x 5 with cues for increased eccentric control. Pt also participated in agility ladder activities without AD forward steps/step to's, side stepping, ins/outs. Pt requiring minA with in/outs due to difficulty with coordination. Participated in locating cards in hallway then returning to gym to place cards on board. Pt was able to correctly locate and place 7/8 cards without assist but with increased time. After seated rest pt ambulated back to room with [facility] and supervision. Pt returned to w/c at end of session and left with belt alarm on, call bell within reach and needs met.   Tx2: Pt presented in w/c agreeable to therapy. Pt states minimal pain in R arm (not rated). Pt ambulated to rehab gym with supervision and SPC. Pt participated in floor transfer with pt able to get onto floor with CGA and was able to return to standing bracing against mat with supervision. Pt then participated in use of rebounder forward and then with R/L rotation x 15 each direction on level tile. Pt did not demonstrate any LOB with activity. Pt then ambulated to stairs and performed step ups x 6 with 1 rail for LE strengthening. Pt was able to safely clear  steps with activity without cues. Pt ambulated back to room at end of session and requested to return to bed. Performed bed mobility supervision to flat bed with use of bed rail. Pt left with bed alarm on, call bell within reach and needs met.      Therapy Documentation Precautions:  Precautions Precautions: Fall Restrictions Weight Bearing Restrictions: No General:   Vital Signs:   Pain:   Mobility:   Locomotion :    Trunk/Postural Assessment :    Balance:   Exercises:   Other Treatments:      Therapy/Group: Individual Therapy  Kaisen Ackers 09/24/2019, 12:26 PM

## 2019-09-25 ENCOUNTER — Inpatient Hospital Stay (HOSPITAL_COMMUNITY): Payer: Medicare Other | Admitting: Occupational Therapy

## 2019-09-25 ENCOUNTER — Inpatient Hospital Stay (HOSPITAL_COMMUNITY): Payer: Medicare Other | Admitting: Physical Therapy

## 2019-09-25 ENCOUNTER — Inpatient Hospital Stay (HOSPITAL_COMMUNITY): Payer: Medicare Other

## 2019-09-25 ENCOUNTER — Other Ambulatory Visit: Payer: Self-pay

## 2019-09-25 DIAGNOSIS — J449 Chronic obstructive pulmonary disease, unspecified: Secondary | ICD-10-CM

## 2019-09-25 DIAGNOSIS — F17200 Nicotine dependence, unspecified, uncomplicated: Secondary | ICD-10-CM

## 2019-09-25 DIAGNOSIS — R0602 Shortness of breath: Secondary | ICD-10-CM

## 2019-09-25 NOTE — Progress Notes (Signed)
[place] PHYSICAL MEDICINE & REHABILITATION PROGRESS NOTE   Subjective/Complaints:   Able to take meds in applesauce  No issues overnite   ROS:No CP, SOB, N/V/D Objective:   No results found. Recent Labs    09/24/19 0507  WBC 3.3*  HGB 13.8  HCT 43.6  PLT 143*   Recent Labs    09/24/19 0507  NA 134*  K 4.7  CL 99  CO2 23  GLUCOSE 106*  BUN 30*  CREATININE 1.29*  CALCIUM 9.3    Intake/Output Summary (Last 24 hours) at 09/25/2019 0749 Last data filed at 09/25/2019 0513 Gross per 24 hour  Intake 698 ml  Output 800 ml  Net -102 ml     Physical Exam: Vital Signs Blood pressure 122/83, pulse 70, temperature 97.9 F (36.6 C), temperature source Axillary, resp. rate 16, height 6\' 2"  (1.88 m), weight 92.6 kg, SpO2 100 %.      General: No acute distress Mood and affect are appropriate Heart: Regular rate and rhythm no rubs murmurs or extra sounds Lungs: Clear to auscultation, breathing unlabored, no rales or wheezes Abdomen: Positive bowel sounds, soft nontender to palpation, nondistended Extremities: No clubbing, cyanosis, or edema Skin: No evidence of breakdown, no evidence of rash Neurologic: Cranial nerves II through XII intact, motor strength is 4/5 in Left , 5/5 RIght  deltoid, bicep, tricep, grip, hip flexor, knee extensors, ankle dorsiflexor and plantar flexor  Musculoskeletal: Full range of motion in all 4 extremities. No joint swelling Left neglect     Musculoskeletal:gouty tophi PIPs RIght hand No Right foot or ankle swelling . Pain with R foot and ankle ROM    Assessment/Plan: 1. Functional deficits secondary to acute R PCA infarct which require 3+ hours per day of interdisciplinary therapy in a comprehensive inpatient rehab setting.  Physiatrist is providing close team supervision and 24 hour management of active medical problems listed below.  Physiatrist and rehab team continue to assess barriers to discharge/monitor patient progress  toward functional and medical goals  Care Tool:  Bathing    Body parts bathed by patient: Right arm, Left arm, Chest, Abdomen, Front perineal area, Buttocks, Right upper leg, Left upper leg, Right lower leg, Left lower leg, Face         Bathing assist Assist Level: Contact Guard/Touching assist     Upper Body Dressing/Undressing Upper body dressing   What is the patient wearing?: Pull over shirt    Upper body assist Assist Level: Supervision/Verbal cueing    Lower Body Dressing/Undressing Lower body dressing      What is the patient wearing?: Pants, Incontinence brief     Lower body assist Assist for lower body dressing: Moderate Assistance - Patient 50 - 74%     Toileting Toileting    Toileting assist Assist for toileting: Independent Assistive Device Comment: urinal   Transfers Chair/bed transfer  Transfers assist     Chair/bed transfer assist level: Supervision/Verbal cueing     Locomotion Ambulation   Ambulation assist      Assist level: Supervision/Verbal cueing Assistive device: Cane-straight Max distance: 24ft   Walk 10 feet activity   Assist     Assist level: Supervision/Verbal cueing Assistive device: Cane-straight   Walk 50 feet activity   Assist    Assist level: Supervision/Verbal cueing Assistive device: Cane-straight    Walk 150 feet activity   Assist Walk 150 feet activity did not occur: Safety/medical concerns  Assist level: Supervision/Verbal cueing Assistive device: Cane-straight    Walk  10 feet on uneven surface  activity   Assist     Assist level: Contact Guard/Touching assist     Wheelchair     Assist Will patient use wheelchair at discharge?: Yes Type of Wheelchair: Manual    Wheelchair assist level: Minimal Assistance - Patient > 75% Max wheelchair distance: 150    Wheelchair 50 feet with 2 turns activity    Assist        Assist Level: Minimal Assistance - Patient > 75%    Wheelchair 150 feet activity     Assist      Assist Level: Minimal Assistance - Patient > 75%   Blood pressure 122/83, pulse 70, temperature 97.9 F (36.6 C), temperature source Axillary, resp. rate 16, height 6\' 2"  (1.88 m), weight 92.6 kg, SpO2 100 %.  Medical Problem List and Plan: 1.  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  PE/DVT hx/Antithrombotics: -anticoagulation:  Pharmaceutical: Other (comment)--Eliquis             -antiplatelet therapy:   Stroke felt to be embolic--add ASA if patient has been compliant with Eliquis per neuro input. Will discuss with patient. .  3. Chronic pain/Pain Management: Continue flexeril prn and lidocaine patches for local measures.   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 Mood: LCSW to follow for evaluation and support.              -antipsychotic agents:  5. Neuropsych: This patient is capable of making decisions on his own behalf. 6. Skin/Wound Care: Routine pressure relief measures.  7. Fluids/Electrolytes/Nutrition: Monitor I/O.    8. CAF: Monitor HR TID.  9. JEH:UDJSHFW TID --continue low dose metoprolol and amlodipine. Was also on furosemide and Cozaar PTA.  Vitals:   09/24/19 2301 09/25/19 0450  BP: 134/85 122/83  Pulse: 60 70  Resp:  16  Temp:  97.9 F (36.6 C)  SpO2:  100%  controlled 5/11 10. Neutropenia: Question transient -- 11.  Right thyroid nodule: will need follow up ultrasound.  12.  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 Hypokalemia: Has resolved--decrease supplement to once a day.  14. T2DM: Hgb A1c- 6.2 and is diet  controlled, but pt says eats cookies/candy daily  CBG (last 3)  No results for input(s): GLUCAP in the last 72 hours.  15. 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  Elevated BUN , oral intake <1043ml per day enc po fluid  LOS: 7 days A FACE TO FACE EVALUATION WAS PERFORMED  [name] 09/25/2019, 7:49 AM

## 2019-09-25 NOTE — Progress Notes (Addendum)
Occupational Therapy Session Note  Patient Details  Name: [name] MRN: [id number] Date of Birth: 09/25/1950  Today's Date: 09/25/2019 OT Individual Time: 0100-0215 OT Individual Time Calculation (min): 75 min    Short Term Goals: Week 1:  OT Short Term Goal 1 (Week 1): STG=LTG d/t ELOS  Skilled Therapeutic Interventions/Progress Updates:  Pt received seated in w/c finishing up lunch agreeable to OT intervention. Pt transported to therapy gym for time mgmt. Pt complete dynavision standing 2 mins with OTA noting decreased awareness to L upper quadrant. Trialed task again with emphasis only in L upper quadrant for 2 mins in standing with a score of 31 and a reaction time of 3.85. Pt required MAX cues to turn head in order to locate stimulus in L upper quadrant. Remainder of session to focus on functional mobility related to IADL tasks with pt able to complete various dynamic balance tasks such as sweeping, making bed, and transporting ADL items with SPC with supervision overall. Pt complete functional mobility obstacle course with SPC and gross supervision. Continued to work on pts vision with pt denying field cut but reports blurry vision during catch and release task. Education provided on low vision compensatory methods related IADL tasks. Pt required MAX cues to return to room using signs to locate room number. Pt left supine in bed with all needs within reach and bed alarm activated.   Therapy Documentation Precautions:  Precautions Precautions: Fall Restrictions Weight Bearing Restrictions: No General:   Vital Signs: Therapy Vitals Temp: (!) 97.5 F (36.4 C) Temp Source: Tympanic Pulse Rate: 71 Resp: 14 BP: 121/86 Patient Position (if appropriate): Sitting Oxygen Therapy SpO2: 100 % O2 Device: Room Air Pain: Pt reports no pain during session.   Therapy/Group: Individual Therapy  Ihor Gully 09/25/2019, 3:59 PM

## 2019-09-25 NOTE — Progress Notes (Signed)
Physical Therapy Session Note  Patient Details  Name: [name] MRN: [id number] Date of Birth: 03-Aug-1950  Today's Date: 09/25/2019 PT Individual Time:  1010 - 1110 PT Individual Time Calculation (min):  60 min       Short Term Goals: Week 1:  PT Short Term Goal 1 (Week 1): STG=LTG due to ELOS  Skilled Therapeutic Interventions/Progress Updates: pt presented in bed sleeping but easily awakened and agreeable to therapy. Session focused on community ambulation and d/c planning. Performed bed mobility mod I with use of bed features and donned socks and shoes with set up and increased time. Pt then performed ambulatory transfer to w/c without AD and CGA. Pt transported to main entrance of hospital for energy conservation and performed ambulation around courtyard with SPC. Pt was able to ambulate >535f with SPC and supervision demonstrating good B foot clearance and overall safety with ambulation. Returned to bench and provided education regarding energy conservation upon d/c and energy conservation in community environment. Pt asked questions regarding driving again with PTA advising to follow up with MD however did indicate that CVA did cause vision deficits in addition to previous deficits that pt had. Per pt vision is improving in that he is not able to see approx 75% of his watch however is still unable to see upper R quadrant of watch. PTA indicated that if he were driving how that could cause an accident with pt expressing some understanding. Pt then ambulated from entrance to rehab unit with SPC. At entrance pt's girlfriend at desk and was able to ambulate and speak with pt with pt able to maintain supervision level ambulation and no deviations. Pt took seated rest at rehab gym and discussed possible d/c of Thursday with HHPT follow up with pt verbalizing understanding and pleased with progress. Pt ambulated back to room at end of session and left in w/c with seat alarm on, call bell within  reach and needs met.      Therapy Documentation Precautions:  Precautions Precautions: Fall Restrictions Weight Bearing Restrictions: No General:   Vital Signs: Therapy Vitals Temp: 97.9 F (36.6 C) Temp Source: Axillary Pulse Rate: 70 Resp: 16 BP: 122/83 Patient Position (if appropriate): Lying Oxygen Therapy SpO2: 100 % O2 Device: Room Air Pain:   Mobility:   Locomotion :    Trunk/Postural Assessment :    Balance:   Exercises:   Other Treatments:      Therapy/Group: Individual Therapy  Loreto Loescher 09/25/2019, 7:59 AM

## 2019-09-25 NOTE — Progress Notes (Signed)
Placed order for cardiac CT for [facility] for Dr. Clayborn Bigness.

## 2019-09-25 NOTE — Progress Notes (Signed)
Speech Language Pathology Daily Session Note  Patient Details  Name: [name] MRN: [id number] Date of Birth: [birth year]  Today's Date: 09/25/2019 SLP Individual Time: 8022-1798 SLP Individual Time Calculation (min): 30 min  Short Term Goals: Week 1: SLP Short Term Goal 1 (Week 1): STG=LTG due to short ELOS  Skilled Therapeutic Interventions: Skilled ST services focused on cognitive skills. SLP facilitated use of vision strategies while navigating cell phone to call 911. Pt was unable to dial 911 and assess for errors due to visual deficits, therefore SLP assisted pt in creating 911 contact in which pt was able to locate given supervision A verbal cues to recognize errors. Pt demonstrated errors due to visual deficits verse congitive deficits. SLP also facilitated word finding strategies in verbal description task, pt required supervision A verbal cues. Pt was left in room with call bell within reach and bed alarm set. ST recommends to continue skilled ST services.      Pain Pain Assessment Pain Scale: 0-10 Pain Score: 0-No pain Pain Type: Acute pain Pain Location: Shoulder Pain Orientation: Left Pain Descriptors / Indicators: Aching Pain Frequency: Intermittent Pain Onset: On-going Patients Stated Pain Goal: 3 Pain Intervention(s): Medication (See eMAR)(tramadol given)  Therapy/Group: Individual Therapy  [name]  [facility] 09/25/2019, 4:44 PM

## 2019-09-25 NOTE — Progress Notes (Signed)
Occupational Therapy Session Note  Patient Details  Name: [name] MRN: [id number] Date of Birth: Jul 30, 1950  Today's Date: 09/25/2019 OT Individual Time: 2993-7169 OT Individual Time Calculation (min): 44 min    Short Term Goals: Week 1:  OT Short Term Goal 1 (Week 1): STG=LTG d/t ELOS  Skilled Therapeutic Interventions/Progress Updates:    Upon entering the room, pt seated in wheelchair at sink brushing teeth. Pt is agreeable to shower this session. Pt ambulating into bathroom with SPC . Pt verbalizes," I don't need that (points to shower seat)". OT provides education to pt about TTB recommendation and how he should be seated as much as possible for shower secondary to increase fall risk. Pt sitting on shower seat and then doffing clothing with increased time. Pt bathing with sit <>stand periodically with supervision. Pt drying self and exiting the bathroom with SPC. Pt seated in wheelchair for dressing tasks at mod I overall. Pt utilized figure four position to apply lotion and don B socks and shoes without assistance. Pt transferred from wheelchair >bed at end of session with call bell and all needed items within reach upon exiting the room.   Therapy Documentation Precautions:  Precautions Precautions: Fall Restrictions Weight Bearing Restrictions: No    ADL: ADL Grooming: Minimal assistance Where Assessed-Lower Body Bathing: Standing at sink Upper Body Dressing: Supervision/safety Where Assessed-Upper Body Dressing: Sitting at sink Lower Body Dressing: Minimal assistance Where Assessed-Lower Body Dressing: Sitting at sink, Standing at sink Toilet Transfer: Minimal assistance Toilet Transfer Equipment: Bedside commode, Emergency planning/management officer Transfer: Minimal assistance Social research officer, government Method: Heritage manager: Shower seat with back   Therapy/Group: Individual Therapy  Gypsy Decant 09/25/2019, 12:33 PM

## 2019-09-26 ENCOUNTER — Inpatient Hospital Stay (HOSPITAL_COMMUNITY): Payer: Medicare Other | Admitting: Occupational Therapy

## 2019-09-26 ENCOUNTER — Inpatient Hospital Stay (HOSPITAL_COMMUNITY): Payer: Medicare Other | Admitting: Physical Therapy

## 2019-09-26 ENCOUNTER — Inpatient Hospital Stay (HOSPITAL_COMMUNITY): Payer: Medicare Other

## 2019-09-26 NOTE — Discharge Summary (Signed)
Physician Discharge Summary  Patient ID: [name] MRN: [id number] DOB/AGE: [birth year] 69 y.o.  Admit date: 09/18/2019 Discharge date: 09/27/2019  Discharge Diagnoses:  Principal Problem:   Acute ischemic right PCA stroke [facility]) Active Problems:   Type 2 diabetes mellitus without complication (HCC)   CVA, old, speech/language deficit   Essential hypertension   Gout   Chronic a-fib (HCC)   AKI (acute kidney injury) ([place])   Discharged Condition: stable   Significant Diagnostic Studies: N/A   Labs:  Basic Metabolic Panel: BMP Latest Ref Rng & Units 09/27/2019 09/24/2019 09/19/2019  Glucose 70 - 99 mg/dL 107(H) 106(H) 121(H)  BUN 8 - 23 mg/dL 27(H) 30(H) 35(H)  Creatinine 0.61 - 1.24 mg/dL 1.10 1.29(H) 1.37(H)  Sodium 135 - 145 mmol/L 136 134(L) 134(L)  Potassium 3.5 - 5.1 mmol/L 5.1 4.7 5.1  Chloride 98 - 111 mmol/L 100 99 103  CO2 22 - 32 mmol/L 26 23 24   Calcium 8.9 - 10.3 mg/dL 9.7 9.3 9.5    CBC: CBC Latest Ref Rng & Units 09/27/2019 09/24/2019 09/19/2019  WBC 4.0 - 10.5 K/uL 2.7(L) 3.3(L) 3.4(L)  Hemoglobin 13.0 - 17.0 g/dL 14.1 13.8 14.9  Hematocrit 39.0 - 52.0 % 45.8 43.6 47.2  Platelets 150 - 400 K/uL 147(L) 143(L) 196    CBG: No results for input(s): GLUCAP in the last 168 hours.  Brief HPI:   [name] is a 69 y.o. 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  MRI brain showed acute cortical/subcortical infarct in posterior medial right temporal and right occipital lobe.  Dr. Fanny Bien was consulted for input on transient bradycardia and recommended Holter  monitor for work-up after discharge.  Dr. Harrell/ophthalmology was consulted for input and worsening of vision and felt changes were due to new left HH combined with chronic right-HH as eye exam was WNL.  Therapy evaluations done revealing moderate dysarthria with word finding deficits, left inattention as well as balance deficits affecting ADLs and mobility.  CIR was recommended due to functional decline   Hospital Course: [name] was admitted to rehab 09/18/2019 for inpatient therapies to consist of PT, ST and OT at least three hours five days a week. Past admission physiatrist, therapy team and rehab RN have worked together to provide customized collaborative inpatient rehab. Neurology was consulted for input and recommended addition of low-dose ASA as patient had been compliant with Eliquis. Blood pressures were monitored on TID basis and and has been controlled on metoprolol and amlodipine.  Serial CBC shows neutropenia to be stable and thrombocytopenia has resolved.  X-rays were done due to complaints of ankle and foot pain and pain felt to be due to gouty changes.  Voltaren gel has been used to right ankle and right MTP with improvement in symptoms.  Follow-up labs showed evidence of acute renal failure and this has improved with increase in fluid intake.  He was advised to continue pushing fluids after discharge    Transient hypokalemia has resolved with supplementation and care was discontinued at discharge.  He was maintained on carb modified diet due to his history of prediabetes.  Fasting blood sugars have been reasonable.  OT has been working with patient to with focus on compensate for visual deficits as well as dynamic balance tasks. He has made gains during his rehab stay and is currently at modified independent to supervision level.  He will continue to receive outpatient PT, OT and ST at [facility] outpatient rehab at [facility]   Rehab course: During patient's stay in  rehab weekly team conferences were held to monitor patient's progress, set goals and discuss barriers to discharge. At admission, patient required min assist with mobility and with basic ADL tasks.  He exhibited cognitive linguistic deficits with higher word finding deficits, delayed recall and dysarthria at phrase level. He  has had improvement in activity tolerance, balance, postural control as well as ability to compensate for deficits. He is able to complete ADL tasks at modified independent level and supervision is recommended for showering.  He is modified independent for transfers and is able to ambulate 150 feet with straight cane.  He is able to utilize speech and word finding strategies with supervisory cues.  Speech rate and intelligibility at 80 to 85% intelligibility with supervision.  He is able to utilize strategies at modified independent to supervision level.  Family education was completed regarding all aspects of safety and care.   Disposition: Home  Diet: Carb modified/heart healthy  Special Instructions: 1.  Will need follow-up thyroid ultrasound to evaluate 2 cm right thyroid nodule. 2.  Increase fluid intake. 3. Will need CBC repeated in 1-2 weeks for follow up on neutropenia.   Discharge Instructions    Ambulatory referral to Physical Medicine Rehab   Complete by: As directed    1-2 weeks TC appointment     Allergies as of 09/27/2019   No Known Allergies     Medication List    TAKE these medications   acetaminophen 325 MG tablet Commonly known as: TYLENOL Take 1-2 tablets (325-650 mg total) by mouth every 4 (four) hours as needed for mild pain. What changed:   how much to take  reasons to take this   allopurinol 300 MG tablet Commonly known as: ZYLOPRIM Take 1 tablet (300 mg total) by mouth daily.   amLODipine 5 MG tablet Commonly known as: NORVASC Take 1 tablet (5 mg total) by mouth daily.   apixaban 5 MG Tabs tablet Commonly known as: Eliquis Take 1  tablet (5 mg total) by mouth 2 (two) times daily.   aspirin 81 MG EC tablet Take 1 tablet (81 mg total) by mouth daily.   atorvastatin 80 MG tablet Commonly known as: LIPITOR Take 1 tablet (80 mg total) by mouth every evening.   colchicine 0.6 MG tablet Take 1 tablet (0.6 mg total) by mouth daily as needed (gout flare).   cyclobenzaprine 5 MG tablet Commonly known as: FLEXERIL Take 1 tablet (5 mg total) by mouth 3 (three) times daily as needed for muscle spasms.   diclofenac Sodium 1 % Gel Commonly known as: VOLTAREN Apply 2 g topically 4 (four) times daily. To right ankle   gabapentin 100 MG capsule Commonly known as: NEURONTIN Take 1 capsule (100 mg total) by mouth 3 (three) times daily.   lidocaine 5 % Commonly known as: Lidoderm Remove & Discard patch within 12 hours or as directed by MD on areas of pain Notes to patient: Purchase over the counter--applied to right shoulder   metoprolol tartrate 25 MG tablet Commonly known as: LOPRESSOR Take 0.5 tablets (12.5 mg total) by mouth 2 (two) times daily.  nicotine 7 mg/24hr patch Commonly known as: NICODERM CQ - dosed in mg/24 hr Place 1 patch (7 mg total) onto the skin daily.   polyethylene glycol 17 g packet Commonly known as: MIRALAX / GLYCOLAX Take 17 g by mouth daily as needed for mild constipation. Notes to patient: Over the counter--one scoop mixed with 8 ounces of fluid      Follow-up Information    Revelo, Elyse Jarvis, MD. Call.   Specialty: Family Medicine Why: for post hospital follow up Contact information: [address] [place] [address] [phone]        [name], MD Follow up.   Specialty: Physical Medicine and Rehabilitation Why: Office will call you with follow up appointment Contact information: [place] [address] [phone]        Teodoro Spray, MD. Call.   Specialty: Cardiology Why: for follow up appointment/holter  monitor Contact information: [place] [address] [phone]        [name], MD. Call.   Specialty: Ophthalmology Why: for follow up appointment Contact information: [place] [address] [place] Follow up.   Specialty: Pediatric Ophthalmology Why: Neuro-opthalmologist for stroke visual changes Contact information: [place] [address] [phone]           Signed: Bary Leriche 09/28/2019, 6:20 PM

## 2019-09-26 NOTE — Progress Notes (Signed)
Team Conference Report to Patient/Family  Team Conference discussion was reviewed with the patient and caregiver, [name], including goals, any changes in plan of care and target discharge date of tomorrow 09/27/19 Patient and caregiver express understanding and are in agreement.  The patient has a target discharge date of (Estimated LOS 7-10 days).  [name] 09/26/2019, 2:35 PM

## 2019-09-26 NOTE — Progress Notes (Signed)
[facility] PHYSICAL MEDICINE & REHABILITATION PROGRESS NOTE   Subjective/Complaints:  No issues overnite   ROS:No CP, SOB, N/V/D Objective:   No results found. Recent Labs    09/24/19 0507  WBC 3.3*  HGB 13.8  HCT 43.6  PLT 143*   Recent Labs    09/24/19 0507  NA 134*  K 4.7  CL 99  CO2 23  GLUCOSE 106*  BUN 30*  CREATININE 1.29*  CALCIUM 9.3    Intake/Output Summary (Last 24 hours) at 09/26/2019 0758 Last data filed at 09/26/2019 0434 Gross per 24 hour  Intake 720 ml  Output 550 ml  Net 170 ml     Physical Exam: Vital Signs Blood pressure (!) 127/91, pulse 69, temperature 97.6 F (36.4 C), resp. rate 18, height 6' 2"  (1.88 m), weight 92.6 kg, SpO2 98 %.   General: No acute distress Mood and affect are appropriate Heart: Regular rate and rhythm no rubs murmurs or extra sounds Lungs: Clear to auscultation, breathing unlabored, no rales or wheezes Abdomen: Positive bowel sounds, soft nontender to palpation, nondistended Extremities: No clubbing, cyanosis, or edema Skin: No evidence of breakdown, no evidence of rash Neurologic: Cranial nerves II through XII intact, motor strength is 5/5 inright 4/5 left deltoid, bicep, tricep, grip, hip flexor, knee extensors, ankle dorsiflexor and plantar flexor  Musculoskeletal: Full range of motion in all 4 extremities. No joint swelling    Musculoskeletal:gouty tophi PIPs RIght hand No Right foot or ankle swelling . Pain with R foot and ankle ROM    Assessment/Plan: 1. Functional deficits secondary to acute R PCA infarct which require 3+ hours per day of interdisciplinary therapy in a comprehensive inpatient rehab setting.  Physiatrist is providing close team supervision and 24 hour management of active medical problems listed below.  Physiatrist and rehab team continue to assess barriers to discharge/monitor patient progress toward functional and medical goals  Care Tool:  Bathing    Body parts bathed by  patient: Right arm, Left arm, Chest, Abdomen, Front perineal area, Buttocks, Right upper leg, Left upper leg, Right lower leg, Left lower leg, Face         Bathing assist Assist Level: Supervision/Verbal cueing     Upper Body Dressing/Undressing Upper body dressing   What is the patient wearing?: Pull over shirt    Upper body assist Assist Level: Independent with assistive device    Lower Body Dressing/Undressing Lower body dressing      What is the patient wearing?: Underwear/pull up, Pants     Lower body assist Assist for lower body dressing: Independent with assitive device     Toileting Toileting    Toileting assist Assist for toileting: Independent with assistive device Assistive Device Comment: urinal   Transfers Chair/bed transfer  Transfers assist     Chair/bed transfer assist level: Supervision/Verbal cueing     Locomotion Ambulation   Ambulation assist      Assist level: Supervision/Verbal cueing Assistive device: Cane-straight Max distance: 242f   Walk 10 feet activity   Assist     Assist level: Supervision/Verbal cueing Assistive device: Cane-straight   Walk 50 feet activity   Assist    Assist level: Supervision/Verbal cueing Assistive device: Cane-straight    Walk 150 feet activity   Assist Walk 150 feet activity did not occur: Safety/medical concerns  Assist level: Supervision/Verbal cueing Assistive device: Cane-straight    Walk 10 feet on uneven surface  activity   Assist     Assist level: Contact Guard/Touching  assist     Wheelchair     Assist Will patient use wheelchair at discharge?: Yes Type of Wheelchair: Manual    Wheelchair assist level: Minimal Assistance - Patient > 75% Max wheelchair distance: 150    Wheelchair 50 feet with 2 turns activity    Assist        Assist Level: Minimal Assistance - Patient > 75%   Wheelchair 150 feet activity     Assist      Assist Level:  Minimal Assistance - Patient > 75%   Blood pressure (!) 127/91, pulse 69, temperature 97.6 F (36.4 C), resp. rate 18, height 6' 2"  (7.53 m), weight 92.6 kg, SpO2 98 %.  Medical Problem List and Plan: 1.  Impaired function, ADLs, and mobility secondary to Aoute R PCA and  Chronic L PCA infarct             -patient may  shower             -ELOS/Goals: 7-10 days; mod I to supervision, pt lives alone, has friend that can check each day Team conference today please see physician documentation under team conference tab, met with team  to discuss problems,progress, and goals. Formulized individual treatment plan based on medical history, underlying problem and comorbidities.    2.  PE/DVT hx/Antithrombotics: -anticoagulation:  Pharmaceutical: Other (comment)--Eliquis             -antiplatelet therapy:   Stroke felt to be embolic--add ASA if patient has been compliant with Eliquis per neuro input. Will discuss with patient. .  3. Chronic pain/Pain Management: Continue flexeril prn and lidocaine patches for local measures.   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 Mood: LCSW to follow for evaluation and support.              -antipsychotic agents:  5. Neuropsych: This patient is capable of making decisions on his own behalf. 6. Skin/Wound Care: Routine pressure relief measures.  7. Fluids/Electrolytes/Nutrition: Monitor I/O.    8. CAF: Monitor HR TID.  9. YYF:RTMYTRZ TID --continue low dose metoprolol and amlodipine. Was also on furosemide and Cozaar PTA.  Vitals:   09/25/19 2251 09/26/19 0432  BP: (!) 139/94 (!) 127/91  Pulse: 70 69  Resp:  18  Temp:  97.6 F (36.4 C)  SpO2:  98%  controlled 5/12 10. Neutropenia: Question transient -- 11.  Right thyroid nodule: will need follow up ultrasound.  12.  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 Hypokalemia: Has resolved--decrease supplement to once a day.  14. T2DM: Hgb A1c- 6.2 and is diet controlled, but pt says eats cookies/candy daily  CBG (last 3)  No results for input(s): GLUCAP in the last 72 hours.  15. 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  Elevated BUN , oral intake <1031m per day enc po fluid  LOS: 8 days A FACE TO FACE EVALUATION WAS PERFORMED  ACharlett Blake5/04/2020, 7:58 AM

## 2019-09-26 NOTE — Progress Notes (Signed)
Physical Therapy Discharge Summary  Patient Details  Name: [name] MRN: [id number] Date of Birth: November 17, 1950  Today's Date: 09/26/2019   Patient has met 8 of 8 long term goals due to improved activity tolerance, improved balance, improved postural control, increased strength, ability to compensate for deficits, improved attention, improved awareness and improved coordination.  Patient to discharge at an ambulatory level Modified Independent.   Patient's care partner is independent to provide the necessary physical and cognitive assistance at discharge.  Reasons goals not met: N/A all goals met  Recommendation:  Patient will benefit from ongoing skilled PT services in outpatient setting to continue to advance safe functional mobility, address ongoing impairments in balance, endurance, safety, community access, and minimize fall risk.  Equipment: No equipment provided  Reasons for discharge: treatment goals met  Patient/family agrees with progress made and goals achieved: Yes  PT Discharge Precautions/Restrictions Precautions Precautions: Fall  Pain Pain Assessment Pain Scale: 0-10 Pain Score: 0-No pain Vision/Perception  Vision - Assessment Tracking/Visual Pursuits: Able to track stimulus in all quads without difficulty Perception Perception: Within Functional Limits Praxis Praxis: Intact  Cognition Overall Cognitive Status: Impaired/Different from baseline Orientation Level: Oriented X4 Sensation Sensation Light Touch: Appears Intact Hot/Cold: Appears Intact Proprioception: Appears Intact Stereognosis: Not tested Coordination Gross Motor Movements are Fluid and Coordinated: Yes Fine Motor Movements are Fluid and Coordinated: Yes Motor  Motor Motor: Hemiplegia;Ataxia Motor - Discharge Observations: improved  Mobility Bed Mobility Bed Mobility: Supine to Sit;Rolling Right;Rolling Left;Sit to Supine Rolling Left: Independent Supine to Sit:  Independent Sit to Supine: Independent Sit to Sidelying Right: Independent Transfers Transfers: Sit to Stand;Stand to Sit;Stand Pivot Transfers Sit to Stand: Independent with assistive device Stand to Sit: Independent with assistive device Stand Pivot Transfers: Independent with assistive device Transfer (Assistive device): Straight cane Locomotion  Gait Ambulation: Yes Gait Assistance: Independent with assistive device Gait Distance (Feet): 150 Feet Assistive device: Straight cane Gait Gait: Yes Gait Pattern: Impaired Gait Pattern: Decreased stride length;Lateral hip instability Stairs / Additional Locomotion Stairs: Yes Stairs Assistance: Supervision/Verbal cueing Stair Management Technique: Two rails Number of Stairs: 12 Height of Stairs: 6 Wheelchair Mobility Wheelchair Mobility: No  Trunk/Postural Assessment  Cervical Assessment Cervical Assessment: Within Functional Limits Thoracic Assessment Thoracic Assessment: Within Functional Limits Lumbar Assessment Lumbar Assessment: Within Functional Limits Postural Control Postural Control: Within Functional Limits  Balance Balance Balance Assessed: Yes Standardized Balance Assessment Standardized Balance Assessment: Berg Balance Test;Timed Up and Go Test Berg Balance Test Sit to Stand: Able to stand without using hands and stabilize independently Standing Unsupported: Able to stand safely 2 minutes Sitting with Back Unsupported but Feet Supported on Floor or Stool: Able to sit safely and securely 2 minutes Stand to Sit: Controls descent by using hands Transfers: Able to transfer safely, minor use of hands Standing Unsupported with Eyes Closed: Able to stand 10 seconds safely Standing Ubsupported with Feet Together: Able to place feet together independently and stand 1 minute safely From Standing, Reach Forward with Outstretched Arm: Can reach confidently >25 cm (10") From Standing Position, Pick up Object from Floor:  Able to pick up shoe safely and easily From Standing Position, Turn to Look Behind Over each Shoulder: Looks behind one side only/other side shows less weight shift Turn 360 Degrees: Able to turn 360 degrees safely one side only in 4 seconds or less Standing Unsupported, Alternately Place Feet on Step/Stool: Able to stand independently and complete 8 steps >20 seconds Standing Unsupported, One Foot in Front: Able to plae  foot ahead of the other independently and hold 30 seconds Standing on One Leg: Tries to lift leg/unable to hold 3 seconds but remains standing independently Total Score: 48 Timed Up and Go Test TUG: Normal TUG Normal TUG (seconds): 8 Static Sitting Balance Static Sitting - Balance Support: No upper extremity supported Static Sitting - Level of Assistance: 7: Independent Dynamic Sitting Balance Dynamic Sitting - Balance Support: No upper extremity supported Dynamic Sitting - Level of Assistance: 7: Independent Static Standing Balance Static Standing - Balance Support: No upper extremity supported Static Standing - Level of Assistance: 6: Modified independent (Device/Increase time) Dynamic Standing Balance Dynamic Standing - Balance Support: No upper extremity supported Dynamic Standing - Level of Assistance: 6: Modified independent (Device/Increase time) Extremity Assessment  RUE Assessment Passive Range of Motion (PROM) Comments: WFLs Active Range of Motion (AROM) Comments: WFLs General Strength Comments: 3+/5 throuhgout with decreased speed and dexterity LUE Assessment LUE Assessment: Within Functional Limits RLE Assessment RLE Assessment: Within Functional Limits LLE Assessment LLE Assessment: Within Functional Limits    Rosita DeChalus 09/26/2019, 1:03 PM

## 2019-09-26 NOTE — Progress Notes (Signed)
Physical Therapy Session Note  Patient Details  Name: [name] MRN: [id number] Date of Birth: 29-Mar-1951  Today's Date: 09/26/2019 PT Individual Time: 1125-1200 and 1330-1340 PT Individual Time Calculation (min): 35 min and 60 min  Short Term Goals: Week 1:  PT Short Term Goal 1 (Week 1): STG=LTG due to ELOS  Skilled Therapeutic Interventions/Progress Updates: Pt presented in bed agreeable to therapy. Pt denies pain during session. Pt now mod I in room, performed bed mobilty mod I and ambulated to ortho gym mod I. Performed car transfer at [place] I and ambulated to ADL apt. Performed furniture transfer with L arm rest and SPC then ambulated to rehab gym. Pt ascended/descened x 12 steps with B rails and step through pattern supervision. Pt participated in TUG and Berg Balance assess with improved scores and 8 sec and 49/56 respectively. Pt also participated in obstacle course including compliant surface, weaving through cones, and stepping over over objects. Pt ambulated back to room at end of session and went to bathroom as PTA obtained drink for pt. Pt left in bathroom with current needs met.    Tx2: Pt presented in bed agreeable to therapy. Pt denies pain during session. Pt ambulated to ortho gym and participated in [place] for scanning activities. Performed in Mode A for 2 min bouts. Due to buttons sticking intermittently response time inaccurate however pt noted to require increased time and min cues to scan to far L. Pt performed both on level tile and on compliant surface with pt able to maintain good balance on both surfaces. Pt then ambulated to rehab gym and participated in STS while standing on balance beam x 5. Pt required min to modA to achieve full standing due to increased instability with unstable surface. Pt then performed while standing on Airex with supervision. Participated in re-bounder standing on Aiurex x 20 with pt able to reach low to bounce ball to pick up  with supervision assist only. Pt ambulated to day room and checked weight on scale, it was noted that pt was unable to read scale without assistance. Participated in NuStep L2 x 12 min for general conditioning with pt able to maintain avg 60-70 SPM. After brief rest pt ambulated back to room and returned to bed with all needs met.      Therapy Documentation Precautions:  Precautions Precautions: Fall Restrictions Weight Bearing Restrictions: No General: PT Amount of Missed Time (min): 10 Minutes PT Missed Treatment Reason: Other (Comment)(late arrival) Vital Signs: Therapy Vitals Temp: (!) 97.5 F (36.4 C) Temp Source: Oral Pulse Rate: 69 Resp: 14 BP: 112/87 Patient Position (if appropriate): Sitting Oxygen Therapy SpO2: 100 % O2 Device: Room Air Pain: Pain Assessment Pain Scale: 0-10 Pain Score: 0-No pain Mobility: Bed Mobility Bed Mobility: Supine to Sit;Rolling Right;Rolling Left;Sit to Supine Rolling Left: Independent Supine to Sit: Independent Sit to Supine: Independent Sit to Sidelying Right: Independent Transfers Transfers: Sit to Stand;Stand to Sit;Stand Pivot Transfers Sit to Stand: Independent with assistive device Stand to Sit: Independent with assistive device Stand Pivot Transfers: Independent with assistive device Transfer (Assistive device): Straight cane Locomotion : Gait Ambulation: Yes Gait Assistance: Independent with assistive device Gait Distance (Feet): 150 Feet Assistive device: Straight cane Gait Gait: Yes Gait Pattern: Impaired Gait Pattern: Decreased stride length;Lateral hip instability Stairs / Additional Locomotion Stairs: Yes Stairs Assistance: Supervision/Verbal cueing Stair Management Technique: Two rails Number of Stairs: 12 Height of Stairs: 6 Wheelchair Mobility Wheelchair Mobility: No  Trunk/Postural Assessment : Cervical Assessment  Cervical Assessment: Within Functional Limits Thoracic Assessment Thoracic Assessment:  Within Functional Limits Lumbar Assessment Lumbar Assessment: Within Functional Limits Postural Control Postural Control: Within Functional Limits  Balance: Balance Balance Assessed: Yes Standardized Balance Assessment Standardized Balance Assessment: Berg Balance Test;Timed Up and Go Test Static Sitting Balance Static Sitting - Balance Support: No upper extremity supported Static Sitting - Level of Assistance: 7: Independent Dynamic Sitting Balance Dynamic Sitting - Balance Support: No upper extremity supported Dynamic Sitting - Level of Assistance: 7: Independent Static Standing Balance Static Standing - Balance Support: No upper extremity supported Static Standing - Level of Assistance: 6: Modified independent (Device/Increase time) Dynamic Standing Balance Dynamic Standing - Balance Support: No upper extremity supported Dynamic Standing - Level of Assistance: 6: Modified independent (Device/Increase time) Exercises:   Other Treatments:      Therapy/Group: Individual Therapy  Freddie Dymek 09/26/2019, 3:44 PM

## 2019-09-26 NOTE — Progress Notes (Signed)
Speech Language Pathology Discharge Summary  Patient Details  Name: [name] MRN: [id number] Date of Birth: [birth year]  Today's Date: 09/26/2019 SLP Individual Time: 1021-1173 SLP Individual Time Calculation (min): 26 min   Skilled Therapeutic Interventions:  Skilled ST services focused on education and speech skills. SLP facilitated recall of vision strategies and speech intelligibility strategies given supervision A verbal cues. Pt demonstrated ability to utilize speech and word finding strategies (organizing thought, slow rate and over articulation) in creating of sentences with multisyllable words with 80% intelligibility given supervision A verbal cues. Pt rats speech intelligibility level at 60% to baseline. SLP provided education about continued use of speech intelligibility word finding strategies in informal conversation. All questions answered to satisfaction. Pt was left in room with call bell within reach. ST recommends to continue skilled ST services.     Patient has met 3 of 3 long term goals.  Patient to discharge at overall Modified Independent;Supervision level.  Reasons goals not met:     Clinical Impression/Discharge Summary:    Pt demonstrated great progress meeting 3 out 3 goals. Pt demonstrated improvement in recall of speech intelligibility and vision strategies with supervision A to mod I on compensate for bilatel left field cut. Pt demonstrated increase in speech intelligibility following instruction of slowing rate and over articulation in formal (multisyalble word and sentences) and informal conversation. Pt's intelligibility during structured task is 80-85% intelligibility with supervision A verbal cues to utilize strategies, however when expressing wants/needs pt' speech intelligibility reflects 60-70% intelligibility with increase difficulty organizing speech further impacted by word finding deficits. Pt' has a history of dysarthria and word finding deficits,  however impairments exacerbated by acute CVA. Pt would continue to benefit from skills ST services in order to [place] functional indepdence and reduce burden of care and continued ST services. SLP recommends 24/hour supervision due to novel vision deficits and concerns during an emergency situation, but only intermittent supervision appears to be available.   Care Partner:  Caregiver Able to Provide Assistance: Yes  Type of Caregiver Assistance: Physical;Cognitive  Recommendation:  Home Health SLP;Other (comment);Outpatient SLP  Rationale for SLP Follow Up: Maximize functional communication   Equipment: N/A   Reasons for discharge: Treatment goals met   Patient/Family Agrees with Progress Made and Goals Achieved: Yes    [name]  [facility] 09/26/2019, 4:48 PM

## 2019-09-26 NOTE — Progress Notes (Signed)
Occupational Therapy Discharge Summary  Patient Details  Name: [name] MRN: [id number] Date of Birth: [birth year]  Today's Date: 09/26/2019 OT Individual Time: 0905-1000 OT Individual Time Calculation (min): 55 min    Patient has met 15 of 15 long term goals due to improved activity tolerance, improved balance, postural control, ability to compensate for deficits and functional use of  RIGHT upper and RIGHT lower extremity.  Patient to discharge at overall mod I level and recommended supervision for shower level.    Reasons goals not met: all goals met  Recommendation:  Patient will benefit from ongoing skilled OT services in outpatient setting to continue to advance functional skills in the area of BADL and iADL.  Equipment: TTB  Reasons for discharge: treatment goals met  Patient/family agrees with progress made and goals achieved: Yes   OT Intervention: Upon entering the room, pt seated on commode for BM with reports of stomach pain. Pt able to have successful BM and performed clothing management and hygiene at mod I level with use of SPC when standing. Pt exiting and performing hand hygiene and grooming tasks while standing at sink at mod I level. Pt requesting therapist assist him with calling significant other. OT also reviewing with caregiver pt's progress, goal level, and plan for discharge tomorrow with her asking questions as needed. Pt made mod I in room with staff being notified and OT reviewing with pt. Call bell and all needs within reach.   OT Discharge Precautions/Restrictions  Precautions Precautions: Fall  Pain Pain Assessment Pain Scale: 0-10 Pain Score: 0-No pain ADL ADL Grooming: Minimal assistance Where Assessed-Lower Body Bathing: Standing at sink Upper Body Dressing: Supervision/safety Where Assessed-Upper Body Dressing: Sitting at sink Lower Body Dressing: Minimal assistance Where Assessed-Lower Body Dressing: Sitting at sink, Standing at  sink Toilet Transfer: Minimal assistance Toilet Transfer Equipment: Bedside commode, Grab bars Social research officer, government: Minimal assistance Social research officer, government Method: Print production planner with back Vision Baseline Vision/History: No visual deficits Patient Visual Report: Blurring of vision;Peripheral vision impairment Tracking/Visual Pursuits: Able to track stimulus in all quads without difficulty Visual Fields: Right visual field deficit Perception  Perception: Within Functional Limits Praxis Praxis: Intact Cognition Overall Cognitive Status: Impaired/Different from baseline Orientation Level: Oriented X4 Sensation Sensation Light Touch: Appears Intact Hot/Cold: Appears Intact Proprioception: Appears Intact Stereognosis: Not tested Coordination Gross Motor Movements are Fluid and Coordinated: Yes Fine Motor Movements are Fluid and Coordinated: Yes Motor  Motor Motor: Hemiplegia;Ataxia Motor - Discharge Observations: improved since initial evaluation Mobility  Bed Mobility Bed Mobility: Supine to Sit;Rolling Right;Rolling Left;Sit to Supine Rolling Left: Independent Supine to Sit: Independent Sit to Supine: Independent Sit to Sidelying Right: Independent Transfers Sit to Stand: Independent with assistive device Stand to Sit: Independent with assistive device  Trunk/Postural Assessment  Cervical Assessment Cervical Assessment: Within Functional Limits Thoracic Assessment Thoracic Assessment: Within Functional Limits Lumbar Assessment Lumbar Assessment: Within Functional Limits Postural Control Postural Control: Within Functional Limits  Balance Balance Balance Assessed: Yes Standardized Balance Assessment Standardized Balance Assessment: (P) Berg Balance Test;Timed Up and Go Test Berg Balance Test Sit to Stand: (P) Able to stand without using hands and stabilize independently Standing Unsupported: (P) Able to stand safely 2  minutes Sitting with Back Unsupported but Feet Supported on Floor or Stool: (P) Able to sit safely and securely 2 minutes Stand to Sit: (P) Controls descent by using hands Transfers: (P) Able to transfer safely, minor use of hands Standing Unsupported with Eyes Closed: (P)  Able to stand 10 seconds safely Standing Ubsupported with Feet Together: (P) Able to place feet together independently and stand 1 minute safely From Standing, Reach Forward with Outstretched Arm: (P) Can reach confidently >25 cm (10") From Standing Position, Pick up Object from Floor: (P) Able to pick up shoe safely and easily From Standing Position, Turn to Look Behind Over each Shoulder: (P) Looks behind one side only/other side shows less weight shift Turn 360 Degrees: (P) Able to turn 360 degrees safely one side only in 4 seconds or less Standing Unsupported, Alternately Place Feet on Step/Stool: (P) Able to stand independently and complete 8 steps >20 seconds Standing Unsupported, One Foot in Front: (P) Able to plae foot ahead of the other independently and hold 30 seconds Standing on One Leg: (P) Tries to lift leg/unable to hold 3 seconds but remains standing independently Total Score: (P) 48 Timed Up and Go Test TUG: (P) Normal TUG Normal TUG (seconds): (P) 8 Static Sitting Balance Static Sitting - Balance Support: No upper extremity supported Static Sitting - Level of Assistance: 7: Independent Dynamic Sitting Balance Dynamic Sitting - Balance Support: No upper extremity supported Dynamic Sitting - Level of Assistance: 7: Independent Static Standing Balance Static Standing - Balance Support: No upper extremity supported Static Standing - Level of Assistance: 6: Modified independent (Device/Increase time) Dynamic Standing Balance Dynamic Standing - Balance Support: No upper extremity supported Dynamic Standing - Level of Assistance: 6: Modified independent (Device/Increase time) Extremity/Trunk Assessment RUE  Assessment Passive Range of Motion (PROM) Comments: WFLs Active Range of Motion (AROM) Comments: WFLs General Strength Comments: 3+/5 throuhgout with decreased speed and dexterity LUE Assessment LUE Assessment: Within Functional Limits   Gypsy Decant 09/26/2019, 12:37 PM

## 2019-09-26 NOTE — Patient Care Conference (Signed)
Inpatient RehabilitationTeam Conference and Plan of Care Update Date: 09/26/2019   Time: 2:54 PM    Patient Name: [name]      Medical Record Number: [id number]  Date of Birth: February 19, 1951 Sex: Male         Room/Bed: 4M10C/4M10C-01 Payor Info: Payor: Marine scientist / Plan: [facility] MEDICARE / Product Type: *No Product type* /    Admit Date/Time:  09/18/2019  2:05 PM  Primary Diagnosis:  Acute ischemic right PCA stroke [facility])  Patient Active Problem List   Diagnosis Date Noted  . AKI (acute kidney injury) ([place])   . History of cocaine abuse ([place])   . Acute ischemic right PCA stroke ([place]) 09/13/2019  . Vision loss, bilateral   . Tobacco abuse   . Bradycardia   . Non-sustained ventricular tachycardia ([place])   . Dizziness 09/12/2019  . Nonintractable headache   . Slurred speech   . Type 2 diabetes mellitus with hyperlipidemia ([place])   . PAD (peripheral artery disease) ([place]) 05/20/2019  . Chronic a-fib ([place]) 04/08/2019  . Fever 04/08/2019  . Sepsis ([place]) 04/08/2019  . Abdominal pain 04/07/2019  . 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 Pain due to onychomycosis of toenails of both feet 11/20/2018  . Diabetic neuropathy ([place]) 11/20/2018  . Influenza A 06/22/2018  . Community acquired pneumonia 06/22/2018  . Arthritis 05/12/2017  . Varicose veins of leg with swelling, bilateral 01/18/2017  . Facial twitching   . 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 Pneumonia 10/12/2014  . Osteoarthritis of right knee 02/11/2014  . Pain and swelling of knee 01/08/2014  . Pain in extremity 01/08/2014  . Weakness due to cerebrovascular accident 01/08/2014  . Pulmonary embolism ([place]) 06/19/2012  . Gout 09/22/2011  . 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 CVA (cerebral infarction) 04/26/2011  . Type 2 diabetes mellitus without complication ([place]) 66/59/9357  . Accelerated hypertension 04/26/2011  . Anemia 04/26/2011  . DVT (deep  venous thrombosis) ([place]) 04/26/2011  . Essential hypertension 04/26/2011    Expected Discharge Date: Expected Discharge Date: 09/27/19  Team Members Present: Physician leading conference: Dr. Alysia Penna Care Coodinator Present: Nestor Lewandowsky, RN, [name], CRRN;Christina Sampson Goon, [place] Nurse Present: Mohammed Kindle, RN PT Present: Phylliss Bob, PTA OT Present: Darleen Crocker, OT SLP Present: Charolett Bumpers, SLP PPS Coordinator present : Ileana Ladd, Burna Mortimer, SLP     Current Status/Progress Goal Weekly Team Focus  Bowel/Bladder   patient is continent of bowel and bladder. LBM 09/25/19  remain continent of bowel and bladder  assist with toileting needs PRN   Swallow/Nutrition/ Hydration             ADL's   supervision overall for all self care tasks and transfers with use of [facility]  supervision for shower transfer and bathing with Mod I for all other goals  family/pt edu, self care retraining, balance, endurance, strengthening   Mobility   supervision bed mobilty, transfers, gait with SPC, stairs x 4  mod I  d/c planning, fall recovery   Communication   Supervision A, 80-90% intelligibility formal and informal 70% intelligibility  Supervision A speech intelligibilty and Mod I word finding  education   Safety/Cognition/ Behavioral Observations  Supervision A recall vision and speech strategies (Mod I)  Supervision A  education   Pain   no c/o pain  remain pain free  assess pain q shift and prn   Skin   skin intact  maintain skin  integrity  assess skin q shift and PRN    Rehab Goals Patient on target to meet rehab goals: Yes Rehab Goals Revised: Patient currely on target *See Care Plan and progress notes for long and short-term goals.     Barriers to Discharge  Current Status/Progress Possible Resolutions Date Resolved   Nursing        on target for d/c          PT         on target for d/c   Practiced fall recovery    09/26/19   OT       on target for d/c           SLP      on target for d/c          SW Medical stability SW not aware of any currently on target          Discharge Planning/Teaching Needs:  Goal to discharge home  Will schedule if recommended   Team Discussion:  Reviewed discharge home with lack of 24 hour supervision. Plan for OP CT in the future per cardiology.  Revisions to Treatment Plan:  Made MOD I in the room in prep for discharge home    Medical Summary Current Status: bilateral visual deficits, Left sided weakness, BPs ok Weekly Focus/Goal: DC planning  Barriers to Discharge: Medical stability   Possible Resolutions to Barriers: d/c planning   Continued Need for Acute Rehabilitation Level of Care: The patient requires daily medical management by a physician with specialized training in physical medicine and rehabilitation for the following reasons: Direction of a multidisciplinary physical rehabilitation program to maximize functional independence : Yes Medical management of patient stability for increased activity during participation in an intensive rehabilitation regime.: Yes Analysis of laboratory values and/or radiology reports with any subsequent need for medication adjustment and/or medical intervention. : Yes   I attest that I was present, lead the team conference, and concur with the assessment and plan of the team.   Dorien Chihuahua B 09/26/2019, 2:54 PM

## 2019-09-27 ENCOUNTER — Inpatient Hospital Stay (HOSPITAL_COMMUNITY): Payer: Medicare Other | Admitting: Occupational Therapy

## 2019-09-27 ENCOUNTER — Inpatient Hospital Stay (HOSPITAL_COMMUNITY): Payer: Medicare Other | Admitting: Physical Therapy

## 2019-09-27 ENCOUNTER — Inpatient Hospital Stay (HOSPITAL_COMMUNITY): Payer: Medicare Other

## 2019-09-27 LAB — CBC WITH DIFFERENTIAL/PLATELET
Abs Immature Granulocytes: 0.01 10*3/uL (ref 0.00–0.07)
Basophils Absolute: 0 10*3/uL (ref 0.0–0.1)
Basophils Relative: 0 %
Eosinophils Absolute: 0.1 10*3/uL (ref 0.0–0.5)
Eosinophils Relative: 3 %
HCT: 45.8 % (ref 39.0–52.0)
Hemoglobin: 14.1 g/dL (ref 13.0–17.0)
Immature Granulocytes: 0 %
Lymphocytes Relative: 44 %
Lymphs Abs: 1.2 10*3/uL (ref 0.7–4.0)
MCH: 24.9 pg — ABNORMAL LOW (ref 26.0–34.0)
MCHC: 30.8 g/dL (ref 30.0–36.0)
MCV: 80.9 fL (ref 80.0–100.0)
Monocytes Absolute: 0.2 10*3/uL (ref 0.1–1.0)
Monocytes Relative: 9 %
Neutro Abs: 1.2 10*3/uL — ABNORMAL LOW (ref 1.7–7.7)
Neutrophils Relative %: 44 %
Platelets: 147 10*3/uL — ABNORMAL LOW (ref 150–400)
RBC: 5.66 MIL/uL (ref 4.22–5.81)
RDW: 16.6 % — ABNORMAL HIGH (ref 11.5–15.5)
WBC: 2.7 10*3/uL — ABNORMAL LOW (ref 4.0–10.5)
nRBC: 0 % (ref 0.0–0.2)

## 2019-09-27 LAB — BASIC METABOLIC PANEL
Anion gap: 10 (ref 5–15)
BUN: 27 mg/dL — ABNORMAL HIGH (ref 8–23)
CO2: 26 mmol/L (ref 22–32)
Calcium: 9.7 mg/dL (ref 8.9–10.3)
Chloride: 100 mmol/L (ref 98–111)
Creatinine, Ser: 1.1 mg/dL (ref 0.61–1.24)
GFR calc Af Amer: >60 mL/min (ref >60)
GFR calc non Af Amer: >60 mL/min (ref >60)
Glucose, Bld: 107 mg/dL — ABNORMAL HIGH (ref 70–99)
Potassium: 5.1 mmol/L (ref 3.5–5.1)
Sodium: 136 mmol/L (ref 135–145)

## 2019-09-27 MED ORDER — AMLODIPINE BESYLATE 5 MG PO TABS: 5.0000 mg | ORAL_TABLET | Freq: Every day | ORAL | 0 refills | Status: DC

## 2019-09-27 MED ORDER — ALLOPURINOL 300 MG PO TABS: 300.0000 mg | ORAL_TABLET | Freq: Every day | ORAL | 0 refills | Status: DC

## 2019-09-27 MED ORDER — ASPIRIN 81 MG PO TBEC: 81.0000 mg | DELAYED_RELEASE_TABLET | Freq: Every day | ORAL | 0 refills | Status: DC

## 2019-09-27 MED ORDER — NICOTINE 7 MG/24HR TD PT24: 7.0000 mg | MEDICATED_PATCH | Freq: Every day | TRANSDERMAL | 0 refills | Status: DC

## 2019-09-27 MED ORDER — GABAPENTIN 100 MG PO CAPS: 100.0000 mg | ORAL_CAPSULE | Freq: Three times a day (TID) | ORAL | 0 refills | Status: DC

## 2019-09-27 MED ORDER — APIXABAN 5 MG PO TABS: 5.0000 mg | ORAL_TABLET | Freq: Two times a day (BID) | ORAL | 0 refills | Status: DC

## 2019-09-27 MED ORDER — ATORVASTATIN CALCIUM 80 MG PO TABS: 80.0000 mg | ORAL_TABLET | Freq: Every evening | ORAL | 0 refills | Status: DC

## 2019-09-27 MED ORDER — CYCLOBENZAPRINE HCL 5 MG PO TABS: 5.0000 mg | ORAL_TABLET | Freq: Three times a day (TID) | ORAL | 0 refills | Status: DC | PRN

## 2019-09-27 MED ORDER — DICLOFENAC SODIUM 1 % EX GEL: 2.0000 g | Freq: Four times a day (QID) | CUTANEOUS | 0 refills | Status: DC

## 2019-09-27 MED ORDER — ACETAMINOPHEN 325 MG PO TABS: 325.0000 mg | ORAL_TABLET | ORAL | Status: DC | PRN

## 2019-09-27 MED ORDER — COLCHICINE 0.6 MG PO TABS: 0.6000 mg | ORAL_TABLET | Freq: Every day | ORAL | 0 refills | Status: DC | PRN

## 2019-09-27 MED ORDER — METOPROLOL TARTRATE 25 MG PO TABS: 12.5000 mg | ORAL_TABLET | Freq: Two times a day (BID) | ORAL | 0 refills | Status: DC

## 2019-09-27 NOTE — Progress Notes (Signed)
Pt discharged home with family. Discharge instructions given by Jeannene Patella, PA. Belongings and equipment sent with pt. No further questions from pt or family. Denies pain or discomfort. Pt stable at time of discharge.   Gerald Stabs, RN

## 2019-09-27 NOTE — Progress Notes (Signed)
[facility] [facility] PHYSICAL MEDICINE & REHABILITATION PROGRESS NOTE   Subjective/Complaints:  No issues overnite,  Discussed no driving based on visual deficits, pt understands  ROS:No CP, SOB, N/V/D Objective:   No results found. No results for input(s): WBC, HGB, HCT, PLT in the last 72 hours. No results for input(s): NA, K, CL, CO2, GLUCOSE, BUN, CREATININE, CALCIUM in the last 72 hours.  Intake/Output Summary (Last 24 hours) at 09/27/2019 0737 Last data filed at 09/26/2019 1848 Gross per 24 hour  Intake 720 ml  Output --  Net 720 ml     Physical Exam: Vital Signs Blood pressure 131/90, pulse 75, temperature 97.7 F (36.5 C), resp. rate 17, height 6\' 2"  (1.88 m), weight 92.6 kg, SpO2 100 %.    General: No acute distress Mood and affect are appropriate Heart: Regular rate and rhythm no rubs murmurs or extra sounds Lungs: Clear to auscultation, breathing unlabored, no rales or wheezes Abdomen: Positive bowel sounds, soft nontender to palpation, nondistended Extremities: No clubbing, cyanosis, or edema Skin: No evidence of breakdown, no evidence of rash  Neurologic: Cranial nerves II through XII intact, motor strength is 5/5 inright 4/5 left deltoid, bicep, tricep, grip, hip flexor, knee extensors, ankle dorsiflexor and plantar flexor  Musculoskeletal: Full range of motion in all 4 extremities. No joint swelling    Musculoskeletal:gouty tophi PIPs RIght hand No Right foot or ankle swelling . Pain with R foot and ankle ROM    Assessment/Plan: 1. Functional deficits secondary to acute R PCA infarct  Stable for D/C today F/u PCP in 3-4 weeks F/u PM&R 2 weeks See D/C summary See D/C instructions- no driving  Care Tool:  Bathing    Body parts bathed by patient: Right arm, Left arm, Chest, Abdomen, Front perineal area, Buttocks, Right upper leg, Left upper leg, Right lower leg, Left lower leg, Face         Bathing assist Assist Level: Supervision/Verbal cueing      Upper Body Dressing/Undressing Upper body dressing   What is the patient wearing?: Pull over shirt    Upper body assist Assist Level: Independent with assistive device    Lower Body Dressing/Undressing Lower body dressing      What is the patient wearing?: Underwear/pull up, Pants     Lower body assist Assist for lower body dressing: Independent with assitive device     Toileting Toileting    Toileting assist Assist for toileting: Independent with assistive device Assistive Device Comment: urinal   Transfers Chair/bed transfer  Transfers assist     Chair/bed transfer assist level: Independent with assistive device     Locomotion Ambulation   Ambulation assist      Assist level: Independent with assistive device Assistive device: Cane-straight Max distance: 155ft   Walk 10 feet activity   Assist     Assist level: Independent with assistive device Assistive device: Cane-straight   Walk 50 feet activity   Assist    Assist level: Independent with assistive device Assistive device: Cane-straight    Walk 150 feet activity   Assist Walk 150 feet activity did not occur: Safety/medical concerns  Assist level: Independent with assistive device Assistive device: Cane-straight    Walk 10 feet on uneven surface  activity   Assist     Assist level: Independent with assistive device Assistive device: Government social research officer Will patient use wheelchair at discharge?: No Type of Wheelchair: Manual    Wheelchair assist level: Minimal Assistance -  Patient > 75% Max wheelchair distance: 150    Wheelchair 50 feet with 2 turns activity    Assist        Assist Level: Minimal Assistance - Patient > 75%   Wheelchair 150 feet activity     Assist      Assist Level: Minimal Assistance - Patient > 75%   Blood pressure 131/90, pulse 75, temperature 97.7 F (36.5 C), resp. rate 17, height 6\' 2"  (1.88 m), weight 92.6  kg, SpO2 100 %.  Medical Problem List and Plan: 1.  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  PE/DVT hx/Antithrombotics: -anticoagulation:  Pharmaceutical: Other (comment)--Eliquis             -antiplatelet therapy:   Stroke felt to be embolic--add ASA if patient has been compliant with Eliquis per neuro input. Will discuss with patient. .  3. Chronic pain/Pain Management: Continue flexeril prn and lidocaine patches for local measures.    4. Mood: LCSW to follow for evaluation and support.              -antipsychotic agents:  5. Neuropsych: This patient is capable of making decisions on his own behalf. 6. Skin/Wound Care: Routine pressure relief measures.  7. Fluids/Electrolytes/Nutrition: Monitor I/O.    8. CAF: Monitor HR TID.  9. CEY:EMVVKPQ TID --continue low dose metoprolol and amlodipine. Was also on furosemide and Cozaar PTA.  Vitals:   09/26/19 1947 09/27/19 0543  BP: 115/82 131/90  Pulse: 71 75  Resp: 18 17  Temp: 97.7 F (36.5 C)   SpO2: 100% 100%  controlled 5/13 10. Neutropenia: Question transient -- 11.  Right thyroid nodule: will need follow up ultrasound.  12.  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 Hypokalemia: Has resolved--decrease supplement to once a day.  14. T2DM: Hgb A1c- 6.2 and is diet controlled, but pt says eats cookies/candy daily  CBG (last 3)  No results for input(s): GLUCAP in the last 72 hours.  15. 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  Elevated BUN , oral intake <1038ml per day enc po fluid  LOS: 9 days A FACE TO Herreid E Naquisha Whitehair 09/27/2019, 7:37 AM

## 2019-09-27 NOTE — Progress Notes (Signed)
Inpatient Rehab Care Coordinator Discharge Note Inpatient Rehabilitation Care Coordinator  Discharge Note  The overall goal for the admission was met for:   Discharge location: Yes, Home  Length of Stay: Yes.  Discharge activity level: Yes  Home/community participation: Yes  Services provided included: MD, RD, PT, OT, SLP, RN, CM, TR, Pharmacy and SW  Financial Services: Private Insurance: [employer]  Follow-up services arranged: Outpatient: [facility]  Comments (or additional information): PT OT ST  Patient/Family verbalized understanding of follow-up arrangements: Yes  Individual responsible for coordination of the follow-up plan: [name] [phone]  Confirmed correct DME delivered: Dyanne Iha 09/27/2019    Dyanne Iha

## 2019-09-27 NOTE — Discharge Instructions (Signed)
Inpatient Rehab Discharge Instructions  [name] Discharge date and time:  09/27/19  Activities/Precautions/ Functional Status: Activity: no lifting, driving, or strenuous exercise till cleared by MD Diet: cardiac diet and diabetic diet Wound Care: none needed   Functional status:   ___ No restrictions     ___ Walk up steps independently _X__ 24/7 supervision/assistance   ___ Walk up steps with assistance ___ Intermittent supervision/assistance  ___ Bathe/dress independently ___ Walk with walker     __X_ Bathe/dress with assistance ___ Walk Independently    ___ Shower independently ___ Walk with assistance    ___ Shower with assistance _X__ No alcohol     ___ Return to work/school ________   Special Instructions: 1. Drink plenty of fluids.   COMMUNITY REFERRALS UPON DISCHARGE:    Outpatient: PT     OT    ST               Agency: [facility] Phone: [phone]              Appointment Date/Time: Tuesday, May 18th at 1:00 PM  Medical Equipment/Items Ordered: Tub Producer, television/film/video                                                 Agency/Supplier:[facility] [employer] [phone]  Information on my medicine - ELIQUIS (apixaban)  This medication education was reviewed with me or my healthcare representative as part of my discharge preparation.    Why was Eliquis prescribed for you? Eliquis was prescribed for you to reduce the risk of forming blood clots that can cause a stroke if you have a medical condition called atrial fibrillation (a type of irregular heartbeat) OR to reduce the risk of a blood clots forming after orthopedic surgery.  What do You need to know about Eliquis ? Take your Eliquis TWICE DAILY - one tablet in the morning and one tablet in the evening with or without food.  It would be best to take the doses about the same time each day.  If you have difficulty swallowing the tablet whole please discuss with your  pharmacist how to take the medication safely.  Take Eliquis exactly as prescribed by your doctor and DO NOT stop taking Eliquis without talking to the doctor who prescribed the medication.  Stopping may increase your risk of developing a new clot or stroke.  Refill your prescription before you run out.  After discharge, you should have regular check-up appointments with your healthcare provider that is prescribing your Eliquis.  In the future your dose may need to be changed if your kidney function or weight changes by a significant amount or as you get older.  What do you do if you miss a dose? If you miss a dose, take it as soon as you remember on the same day and resume taking twice daily.  Do not take more than one dose of ELIQUIS at the same time.  Important Safety Information A possible side effect of Eliquis is bleeding. You should call your healthcare provider right away if you experience any of the following: ? Bleeding from an injury or your nose that does not stop. ? Unusual colored urine (red or dark brown) or unusual colored stools (red or black). ? Unusual bruising for unknown reasons. ? A serious fall or if you hit your  head (even if there is no bleeding).  Some medicines may interact with Eliquis and might increase your risk of bleeding or clotting while on Eliquis. To help avoid this, consult your healthcare provider or pharmacist prior to using any new prescription or non-prescription medications, including herbals, vitamins, non-steroidal anti-inflammatory drugs (NSAIDs) and supplements.  This website has more information on Eliquis (apixaban): [email or web address].no.    My questions have been answered and I understand these instructions. I will adhere to these goals and the provided educational materials after my discharge from the hospital.  Patient/Caregiver Signature _______________________________ Date __________  Clinician Signature  _______________________________________ Date __________  Please bring this form and your medication list with you to all your follow-up doctor's appointments.

## 2019-10-02 ENCOUNTER — Other Ambulatory Visit: Payer: Self-pay

## 2019-10-02 ENCOUNTER — Ambulatory Visit: Payer: Medicare Other | Attending: Physician Assistant | Admitting: Occupational Therapy

## 2019-10-02 ENCOUNTER — Encounter: Payer: Self-pay | Admitting: Occupational Therapy

## 2019-10-02 DIAGNOSIS — R2689 Other abnormalities of gait and mobility: Secondary | ICD-10-CM | POA: Insufficient documentation

## 2019-10-02 DIAGNOSIS — R262 Difficulty in walking, not elsewhere classified: Secondary | ICD-10-CM | POA: Insufficient documentation

## 2019-10-02 DIAGNOSIS — R41841 Cognitive communication deficit: Secondary | ICD-10-CM | POA: Diagnosis present

## 2019-10-02 DIAGNOSIS — M6281 Muscle weakness (generalized): Secondary | ICD-10-CM | POA: Diagnosis not present

## 2019-10-02 DIAGNOSIS — R278 Other lack of coordination: Secondary | ICD-10-CM | POA: Diagnosis present
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
## 2019-10-02 NOTE — Addendum Note (Signed)
Addended by: Lucia Bitter on: 10/02/2019 05:45 PM   Modules accepted: Orders

## 2019-10-04 ENCOUNTER — Ambulatory Visit: Payer: Medicare Other | Admitting: Speech Pathology

## 2019-10-04 ENCOUNTER — Other Ambulatory Visit: Payer: Self-pay

## 2019-10-04 ENCOUNTER — Encounter: Payer: Self-pay | Admitting: Physical Therapy

## 2019-10-04 ENCOUNTER — Encounter: Payer: Self-pay | Admitting: Speech Pathology

## 2019-10-04 ENCOUNTER — Ambulatory Visit: Payer: Medicare Other | Admitting: Physical Therapy

## 2019-10-04 DIAGNOSIS — R278 Other lack of coordination: Secondary | ICD-10-CM

## 2019-10-04 DIAGNOSIS — R2689 Other abnormalities of gait and mobility: Secondary | ICD-10-CM

## 2019-10-04 DIAGNOSIS — M6281 Muscle weakness (generalized): Secondary | ICD-10-CM | POA: Diagnosis not present

## 2019-10-04 DIAGNOSIS — R262 Difficulty in walking, not elsewhere classified: Secondary | ICD-10-CM

## 2019-10-04 DIAGNOSIS — R41841 Cognitive communication deficit: Secondary | ICD-10-CM
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
## 2019-10-08 ENCOUNTER — Encounter: Payer: Medicare Other | Admitting: Registered Nurse

## 2019-10-09 ENCOUNTER — Other Ambulatory Visit: Payer: Self-pay

## 2019-10-09 ENCOUNTER — Encounter: Payer: Self-pay | Admitting: Occupational Therapy

## 2019-10-09 ENCOUNTER — Encounter: Payer: Medicare Other | Admitting: Speech Pathology

## 2019-10-09 ENCOUNTER — Ambulatory Visit: Payer: Medicare Other | Admitting: Occupational Therapy

## 2019-10-09 DIAGNOSIS — R278 Other lack of coordination: Secondary | ICD-10-CM

## 2019-10-09 DIAGNOSIS — M6281 Muscle weakness (generalized): Secondary | ICD-10-CM | POA: Diagnosis not present
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
## 2019-10-11 ENCOUNTER — Ambulatory Visit: Payer: Medicare Other

## 2019-10-12 ENCOUNTER — Ambulatory Visit: Payer: Medicare Other | Admitting: Speech Pathology

## 2019-10-12 ENCOUNTER — Ambulatory Visit: Payer: Medicare Other

## 2019-10-16 ENCOUNTER — Ambulatory Visit: Payer: Medicare Other | Attending: Physician Assistant | Admitting: Occupational Therapy

## 2019-10-16 ENCOUNTER — Ambulatory Visit: Payer: Medicare Other | Admitting: Physical Therapy

## 2019-10-16 ENCOUNTER — Encounter: Payer: Self-pay | Admitting: Occupational Therapy

## 2019-10-16 ENCOUNTER — Other Ambulatory Visit: Payer: Self-pay

## 2019-10-16 DIAGNOSIS — R278 Other lack of coordination: Secondary | ICD-10-CM | POA: Diagnosis present

## 2019-10-16 DIAGNOSIS — H543 Unqualified visual loss, both eyes: Secondary | ICD-10-CM | POA: Insufficient documentation

## 2019-10-16 DIAGNOSIS — M6281 Muscle weakness (generalized): Secondary | ICD-10-CM | POA: Insufficient documentation
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
## 2019-10-17 ENCOUNTER — Other Ambulatory Visit: Payer: Self-pay | Admitting: Physical Medicine and Rehabilitation

## 2019-10-17 ENCOUNTER — Encounter: Payer: Medicare Other | Admitting: Speech Pathology

## 2019-10-18 ENCOUNTER — Other Ambulatory Visit: Payer: Self-pay

## 2019-10-18 ENCOUNTER — Encounter: Payer: Self-pay | Admitting: Occupational Therapy

## 2019-10-18 ENCOUNTER — Ambulatory Visit: Payer: Medicare Other | Admitting: Occupational Therapy

## 2019-10-18 DIAGNOSIS — R278 Other lack of coordination: Secondary | ICD-10-CM | POA: Diagnosis not present

## 2019-10-18 DIAGNOSIS — M6281 Muscle weakness (generalized): Secondary | ICD-10-CM

## 2019-10-18 DIAGNOSIS — H543 Unqualified visual loss, both eyes: Secondary | ICD-10-CM
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
## 2019-10-19 ENCOUNTER — Ambulatory Visit: Payer: Medicare Other

## 2019-10-22 ENCOUNTER — Encounter: Payer: Medicare Other | Admitting: Speech Pathology

## 2019-10-22 ENCOUNTER — Ambulatory Visit: Payer: Medicare Other

## 2019-10-23 ENCOUNTER — Encounter: Payer: Self-pay | Admitting: Occupational Therapy

## 2019-10-23 ENCOUNTER — Other Ambulatory Visit: Payer: Self-pay

## 2019-10-23 ENCOUNTER — Ambulatory Visit: Payer: Medicare Other | Admitting: Occupational Therapy

## 2019-10-23 DIAGNOSIS — R278 Other lack of coordination: Secondary | ICD-10-CM | POA: Diagnosis not present

## 2019-10-23 DIAGNOSIS — M6281 Muscle weakness (generalized): Secondary | ICD-10-CM

## 2019-10-23 DIAGNOSIS — H543 Unqualified visual loss, both eyes: Secondary | ICD-10-CM
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
## 2019-10-24 ENCOUNTER — Encounter: Payer: Medicare Other | Admitting: Speech Pathology

## 2019-10-24 ENCOUNTER — Ambulatory Visit: Payer: Medicare Other | Admitting: Physical Therapy

## 2019-10-24 ENCOUNTER — Encounter: Payer: Medicare Other | Admitting: Occupational Therapy

## 2019-10-25 ENCOUNTER — Other Ambulatory Visit: Payer: Self-pay

## 2019-10-25 ENCOUNTER — Ambulatory Visit: Payer: Medicare Other | Admitting: Occupational Therapy

## 2019-10-25 ENCOUNTER — Encounter: Payer: Self-pay | Admitting: Occupational Therapy

## 2019-10-25 DIAGNOSIS — R278 Other lack of coordination: Secondary | ICD-10-CM | POA: Diagnosis not present

## 2019-10-25 DIAGNOSIS — M6281 Muscle weakness (generalized): Secondary | ICD-10-CM
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
## 2019-10-28 ENCOUNTER — Other Ambulatory Visit: Payer: Self-pay | Admitting: Physical Medicine and Rehabilitation

## 2019-10-30 ENCOUNTER — Ambulatory Visit: Payer: Medicare Other | Admitting: Occupational Therapy

## 2019-10-30 ENCOUNTER — Other Ambulatory Visit: Payer: Self-pay

## 2019-10-30 ENCOUNTER — Encounter: Payer: Self-pay | Admitting: Occupational Therapy

## 2019-10-30 DIAGNOSIS — M6281 Muscle weakness (generalized): Secondary | ICD-10-CM

## 2019-10-30 DIAGNOSIS — R278 Other lack of coordination: Secondary | ICD-10-CM

## 2019-10-30 DIAGNOSIS — H543 Unqualified visual loss, both eyes: Secondary | ICD-10-CM
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
## 2019-10-31 ENCOUNTER — Ambulatory Visit: Payer: Medicare Other

## 2019-10-31 ENCOUNTER — Encounter: Payer: Medicare Other | Admitting: Speech Pathology

## 2019-10-31 ENCOUNTER — Ambulatory Visit: Payer: Medicare Other | Admitting: Physical Therapy

## 2019-11-01 ENCOUNTER — Encounter: Payer: Self-pay | Admitting: Occupational Therapy

## 2019-11-01 ENCOUNTER — Other Ambulatory Visit: Payer: Self-pay

## 2019-11-01 ENCOUNTER — Ambulatory Visit: Payer: Medicare Other | Admitting: Occupational Therapy

## 2019-11-01 DIAGNOSIS — H543 Unqualified visual loss, both eyes: Secondary | ICD-10-CM

## 2019-11-01 DIAGNOSIS — M6281 Muscle weakness (generalized): Secondary | ICD-10-CM

## 2019-11-01 DIAGNOSIS — R278 Other lack of coordination: Secondary | ICD-10-CM
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
## 2019-11-02 ENCOUNTER — Encounter: Payer: Medicare Other | Admitting: Occupational Therapy

## 2019-11-02 ENCOUNTER — Ambulatory Visit: Payer: Medicare Other

## 2019-11-06 ENCOUNTER — Other Ambulatory Visit: Payer: Self-pay

## 2019-11-06 ENCOUNTER — Encounter: Payer: Self-pay | Admitting: Occupational Therapy

## 2019-11-06 ENCOUNTER — Ambulatory Visit: Payer: Medicare Other | Admitting: Occupational Therapy

## 2019-11-06 DIAGNOSIS — R278 Other lack of coordination: Secondary | ICD-10-CM | POA: Diagnosis not present

## 2019-11-06 DIAGNOSIS — M6281 Muscle weakness (generalized): Secondary | ICD-10-CM
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
## 2019-11-07 ENCOUNTER — Encounter: Payer: Medicare Other | Admitting: Occupational Therapy

## 2019-11-07 ENCOUNTER — Ambulatory Visit: Payer: Medicare Other | Admitting: Physical Therapy

## 2019-11-08 ENCOUNTER — Ambulatory Visit: Payer: Medicare Other | Admitting: Occupational Therapy

## 2019-11-08 ENCOUNTER — Encounter: Payer: Self-pay | Admitting: Occupational Therapy

## 2019-11-08 ENCOUNTER — Other Ambulatory Visit: Payer: Self-pay

## 2019-11-08 DIAGNOSIS — H543 Unqualified visual loss, both eyes: Secondary | ICD-10-CM

## 2019-11-08 DIAGNOSIS — R278 Other lack of coordination: Secondary | ICD-10-CM

## 2019-11-08 DIAGNOSIS — M6281 Muscle weakness (generalized): Secondary | ICD-10-CM
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
## 2019-11-09 ENCOUNTER — Encounter: Payer: Medicare Other | Admitting: Occupational Therapy

## 2019-11-09 ENCOUNTER — Ambulatory Visit: Payer: Medicare Other

## 2019-11-09 ENCOUNTER — Encounter: Payer: Medicare Other | Admitting: Speech Pathology

## 2019-11-13 ENCOUNTER — Other Ambulatory Visit: Payer: Self-pay

## 2019-11-13 ENCOUNTER — Encounter: Payer: Self-pay | Admitting: Occupational Therapy

## 2019-11-13 ENCOUNTER — Ambulatory Visit: Payer: Medicare Other | Admitting: Occupational Therapy

## 2019-11-13 DIAGNOSIS — R278 Other lack of coordination: Secondary | ICD-10-CM | POA: Diagnosis not present

## 2019-11-13 DIAGNOSIS — M6281 Muscle weakness (generalized): Secondary | ICD-10-CM
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
## 2019-11-14 ENCOUNTER — Encounter: Payer: Medicare Other | Admitting: Speech Pathology

## 2019-11-14 ENCOUNTER — Ambulatory Visit: Payer: Medicare Other | Admitting: Physical Therapy

## 2019-11-14 ENCOUNTER — Encounter: Payer: Medicare Other | Admitting: Occupational Therapy

## 2019-11-15 ENCOUNTER — Other Ambulatory Visit: Payer: Self-pay

## 2019-11-15 ENCOUNTER — Encounter: Payer: Self-pay | Admitting: Occupational Therapy

## 2019-11-15 ENCOUNTER — Ambulatory Visit: Payer: Medicare Other | Attending: Physician Assistant | Admitting: Occupational Therapy

## 2019-11-15 DIAGNOSIS — M6281 Muscle weakness (generalized): Secondary | ICD-10-CM | POA: Diagnosis not present

## 2019-11-15 DIAGNOSIS — R278 Other lack of coordination: Secondary | ICD-10-CM
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
## 2019-11-16 ENCOUNTER — Ambulatory Visit: Payer: Medicare Other

## 2019-11-16 ENCOUNTER — Encounter: Payer: Medicare Other | Admitting: Speech Pathology

## 2019-11-18 ENCOUNTER — Other Ambulatory Visit: Payer: Self-pay | Admitting: Physical Medicine and Rehabilitation

## 2019-11-20 ENCOUNTER — Ambulatory Visit: Payer: Medicare Other | Admitting: Occupational Therapy

## 2019-11-20 ENCOUNTER — Other Ambulatory Visit: Payer: Self-pay | Admitting: Physical Medicine and Rehabilitation

## 2019-11-22 ENCOUNTER — Ambulatory Visit: Payer: Medicare Other | Admitting: Occupational Therapy

## 2019-11-24 ENCOUNTER — Other Ambulatory Visit: Payer: Self-pay | Admitting: Physical Medicine and Rehabilitation

## 2019-11-27 ENCOUNTER — Ambulatory Visit: Payer: Medicare Other | Admitting: Occupational Therapy

## 2019-11-29 ENCOUNTER — Ambulatory Visit (INDEPENDENT_AMBULATORY_CARE_PROVIDER_SITE_OTHER): Payer: Medicare Other | Admitting: Podiatry

## 2019-11-29 ENCOUNTER — Other Ambulatory Visit: Payer: Self-pay

## 2019-11-29 ENCOUNTER — Ambulatory Visit: Payer: Medicare Other | Admitting: Occupational Therapy

## 2019-11-29 ENCOUNTER — Encounter: Payer: Self-pay | Admitting: Podiatry

## 2019-11-29 DIAGNOSIS — M79674 Pain in right toe(s): Secondary | ICD-10-CM

## 2019-11-29 DIAGNOSIS — E1142 Type 2 diabetes mellitus with diabetic polyneuropathy: Secondary | ICD-10-CM | POA: Diagnosis not present

## 2019-11-29 DIAGNOSIS — B351 Tinea unguium: Secondary | ICD-10-CM

## 2019-11-29 DIAGNOSIS — I739 Peripheral vascular disease, unspecified: Secondary | ICD-10-CM | POA: Diagnosis not present

## 2019-11-29 DIAGNOSIS — M79675 Pain in left toe(s): Secondary | ICD-10-CM | POA: Diagnosis not present

## 2019-11-29 DIAGNOSIS — D689 Coagulation defect, unspecified: Secondary | ICD-10-CM

## 2019-11-29 NOTE — Progress Notes (Signed)
This patient returns to my office for at risk foot care.  This patient requires this care by a professional since this patient will be at risk due to having diabetes, history of DVT and PAD.  Patient is taking eliquiss.  This patient is unable to cut nails himself since the patient cannot reach his nails.These nails are painful walking and wearing shoes.  This patient presents for at risk foot care today.  General Appearance  Alert, conversant and in no acute stress.  Vascular  Dorsalis pedis and posterior tibial  pulses are palpable  bilaterally.  Capillary return is within normal limits  bilaterally. Temperature is within normal limits  Bilaterally. Venous stasis legs  B/l.  Neurologic  Senn-Weinstein monofilament wire test diminished   bilaterally. Muscle power within normal limits bilaterally.  Nails Thick disfigured discolored nails with subungual debris  from hallux to fifth toes bilaterally. No evidence of bacterial infection or drainage bilaterally.  Orthopedic  No limitations of motion  feet .  No crepitus or effusions noted.  No bony pathology or digital deformities noted.  DJD midfoot right.  HAV  B/L  Skin  normotropic skin with no porokeratosis noted bilaterally.  No signs of infections or ulcers noted.     Onychomycosis  Pain in right toes  Pain in left toes  Consent was obtained for treatment procedures.   Mechanical debridement of nails 1-5  bilaterally performed with a nail nipper.  Filed with dremel without incident.    Return office visit   3 months                   Told patient to return for periodic foot care and evaluation due to potential at risk complications.   Gardiner Barefoot DPM

## 2019-12-04 ENCOUNTER — Ambulatory Visit: Payer: Medicare Other | Admitting: Occupational Therapy

## 2019-12-06 ENCOUNTER — Encounter: Payer: Medicare Other | Admitting: Occupational Therapy

## 2019-12-06 ENCOUNTER — Other Ambulatory Visit: Payer: Self-pay | Admitting: Physical Medicine and Rehabilitation

## 2019-12-11 ENCOUNTER — Ambulatory Visit: Payer: Medicare Other | Admitting: Occupational Therapy

## 2019-12-13 ENCOUNTER — Encounter: Payer: Medicare Other | Admitting: Occupational Therapy

## 2019-12-18 ENCOUNTER — Encounter: Payer: Medicare Other | Admitting: Occupational Therapy

## 2019-12-24 ENCOUNTER — Encounter: Payer: Medicare Other | Admitting: Occupational Therapy

## 2019-12-26 ENCOUNTER — Other Ambulatory Visit: Payer: Self-pay | Admitting: Physical Medicine and Rehabilitation

## 2019-12-26 ENCOUNTER — Encounter: Payer: Medicare Other | Admitting: Occupational Therapy

## 2019-12-31 ENCOUNTER — Encounter: Payer: Medicare Other | Admitting: Occupational Therapy

## 2020-01-02 ENCOUNTER — Encounter: Payer: Medicare Other | Admitting: Occupational Therapy

## 2020-01-07 ENCOUNTER — Encounter: Payer: Medicare Other | Admitting: Occupational Therapy

## 2020-01-09 ENCOUNTER — Encounter: Payer: Medicare Other | Admitting: Occupational Therapy

## 2020-01-14 ENCOUNTER — Encounter: Payer: Medicare Other | Admitting: Occupational Therapy

## 2020-01-16 ENCOUNTER — Encounter: Payer: Medicare Other | Admitting: Occupational Therapy

## 2020-01-28 ENCOUNTER — Inpatient Hospital Stay
Admission: EM | Admit: 2020-01-28 | Discharge: 2020-02-03 | DRG: 871 | Disposition: A | Discharge status: Skilled Nursing Facility | Payer: Medicare Other | Attending: Internal Medicine | Admitting: Internal Medicine

## 2020-01-28 ENCOUNTER — Emergency Department: Payer: Medicare Other

## 2020-01-28 ENCOUNTER — Inpatient Hospital Stay: Payer: Medicare Other

## 2020-01-28 ENCOUNTER — Other Ambulatory Visit: Payer: Self-pay

## 2020-01-28 DIAGNOSIS — R197 Diarrhea, unspecified: Secondary | ICD-10-CM | POA: Diagnosis present

## 2020-01-28 DIAGNOSIS — I110 Hypertensive heart disease with heart failure: Secondary | ICD-10-CM | POA: Diagnosis present

## 2020-01-28 DIAGNOSIS — Z8673 Personal history of transient ischemic attack (TIA), and cerebral infarction without residual deficits: Secondary | ICD-10-CM

## 2020-01-28 DIAGNOSIS — M109 Gout, unspecified: Secondary | ICD-10-CM | POA: Diagnosis present

## 2020-01-28 DIAGNOSIS — R410 Disorientation, unspecified: Secondary | ICD-10-CM | POA: Diagnosis not present

## 2020-01-28 DIAGNOSIS — Z86711 Personal history of pulmonary embolism: Secondary | ICD-10-CM | POA: Diagnosis not present

## 2020-01-28 DIAGNOSIS — N3000 Acute cystitis without hematuria: Secondary | ICD-10-CM

## 2020-01-28 DIAGNOSIS — Z7901 Long term (current) use of anticoagulants: Secondary | ICD-10-CM

## 2020-01-28 DIAGNOSIS — Z7982 Long term (current) use of aspirin: Secondary | ICD-10-CM

## 2020-01-28 DIAGNOSIS — I2699 Other pulmonary embolism without acute cor pulmonale: Secondary | ICD-10-CM | POA: Diagnosis present

## 2020-01-28 DIAGNOSIS — I482 Chronic atrial fibrillation, unspecified: Secondary | ICD-10-CM | POA: Diagnosis present

## 2020-01-28 DIAGNOSIS — E1151 Type 2 diabetes mellitus with diabetic peripheral angiopathy without gangrene: Secondary | ICD-10-CM | POA: Diagnosis present

## 2020-01-28 DIAGNOSIS — R778 Other specified abnormalities of plasma proteins: Secondary | ICD-10-CM | POA: Diagnosis not present

## 2020-01-28 DIAGNOSIS — R7881 Bacteremia: Secondary | ICD-10-CM | POA: Diagnosis not present

## 2020-01-28 DIAGNOSIS — I5042 Chronic combined systolic (congestive) and diastolic (congestive) heart failure: Secondary | ICD-10-CM | POA: Diagnosis present

## 2020-01-28 DIAGNOSIS — R5381 Other malaise: Secondary | ICD-10-CM

## 2020-01-28 DIAGNOSIS — I10 Essential (primary) hypertension: Secondary | ICD-10-CM | POA: Diagnosis not present

## 2020-01-28 DIAGNOSIS — E785 Hyperlipidemia, unspecified: Secondary | ICD-10-CM | POA: Diagnosis present

## 2020-01-28 DIAGNOSIS — E876 Hypokalemia: Secondary | ICD-10-CM | POA: Diagnosis present

## 2020-01-28 DIAGNOSIS — N390 Urinary tract infection, site not specified: Secondary | ICD-10-CM | POA: Diagnosis present

## 2020-01-28 DIAGNOSIS — R6520 Severe sepsis without septic shock: Secondary | ICD-10-CM | POA: Diagnosis present

## 2020-01-28 DIAGNOSIS — I248 Other forms of acute ischemic heart disease: Secondary | ICD-10-CM | POA: Diagnosis present

## 2020-01-28 DIAGNOSIS — I82409 Acute embolism and thrombosis of unspecified deep veins of unspecified lower extremity: Secondary | ICD-10-CM | POA: Diagnosis present

## 2020-01-28 DIAGNOSIS — D6959 Other secondary thrombocytopenia: Secondary | ICD-10-CM | POA: Diagnosis present

## 2020-01-28 DIAGNOSIS — Z86718 Personal history of other venous thrombosis and embolism: Secondary | ICD-10-CM

## 2020-01-28 DIAGNOSIS — Z720 Tobacco use: Secondary | ICD-10-CM | POA: Diagnosis not present

## 2020-01-28 DIAGNOSIS — Z79899 Other long term (current) drug therapy: Secondary | ICD-10-CM | POA: Diagnosis not present

## 2020-01-28 DIAGNOSIS — I825Y3 Chronic embolism and thrombosis of unspecified deep veins of proximal lower extremity, bilateral: Secondary | ICD-10-CM

## 2020-01-28 DIAGNOSIS — I639 Cerebral infarction, unspecified: Secondary | ICD-10-CM | POA: Diagnosis present

## 2020-01-28 DIAGNOSIS — A4151 Sepsis due to Escherichia coli [E. coli]: Principal | ICD-10-CM | POA: Diagnosis present

## 2020-01-28 DIAGNOSIS — A419 Sepsis, unspecified organism: Secondary | ICD-10-CM | POA: Diagnosis not present

## 2020-01-28 DIAGNOSIS — G9341 Metabolic encephalopathy: Secondary | ICD-10-CM | POA: Diagnosis present

## 2020-01-28 DIAGNOSIS — F17210 Nicotine dependence, cigarettes, uncomplicated: Secondary | ICD-10-CM | POA: Diagnosis present

## 2020-01-28 DIAGNOSIS — Z20822 Contact with and (suspected) exposure to covid-19: Secondary | ICD-10-CM | POA: Diagnosis present

## 2020-01-28 DIAGNOSIS — I5043 Acute on chronic combined systolic (congestive) and diastolic (congestive) heart failure: Secondary | ICD-10-CM | POA: Diagnosis present

## 2020-01-28 DIAGNOSIS — Z23 Encounter for immunization: Secondary | ICD-10-CM | POA: Diagnosis present

## 2020-01-28 DIAGNOSIS — R17 Unspecified jaundice: Secondary | ICD-10-CM

## 2020-01-28 DIAGNOSIS — R079 Chest pain, unspecified: Secondary | ICD-10-CM | POA: Diagnosis present

## 2020-01-28 LAB — COMPREHENSIVE METABOLIC PANEL
ALT: 22 U/L (ref 0–44)
AST: 31 U/L (ref 15–41)
Albumin: 3.5 g/dL (ref 3.5–5.0)
Alkaline Phosphatase: 123 U/L (ref 38–126)
Anion gap: 10 (ref 5–15)
BUN: 20 mg/dL (ref 8–23)
CO2: 24 mmol/L (ref 22–32)
Calcium: 8.6 mg/dL — ABNORMAL LOW (ref 8.9–10.3)
Chloride: 99 mmol/L (ref 98–111)
Creatinine, Ser: 1.11 mg/dL (ref 0.61–1.24)
GFR calc Af Amer: >60 mL/min (ref >60)
GFR calc non Af Amer: >60 mL/min (ref >60)
Glucose, Bld: 110 mg/dL — ABNORMAL HIGH (ref 70–99)
Potassium: 3.4 mmol/L — ABNORMAL LOW (ref 3.5–5.1)
Sodium: 133 mmol/L — ABNORMAL LOW (ref 135–145)
Total Bilirubin: 2.1 mg/dL — ABNORMAL HIGH (ref 0.3–1.2)
Total Protein: 7.4 g/dL (ref 6.5–8.1)

## 2020-01-28 LAB — GASTROINTESTINAL PANEL BY PCR, STOOL (REPLACES STOOL CULTURE)

## 2020-01-28 LAB — URINE DRUG SCREEN, QUALITATIVE (ARMC ONLY)
Amphetamines, Ur Screen: NOT DETECTED
Barbiturates, Ur Screen: NOT DETECTED
Benzodiazepine, Ur Scrn: NOT DETECTED
Cannabinoid 50 Ng, Ur ~~LOC~~: NOT DETECTED
Cocaine Metabolite,Ur ~~LOC~~: NOT DETECTED
MDMA (Ecstasy)Ur Screen: NOT DETECTED
Methadone Scn, Ur: NOT DETECTED
Opiate, Ur Screen: NOT DETECTED
Phencyclidine (PCP) Ur S: NOT DETECTED
Tricyclic, Ur Screen: POSITIVE — AB

## 2020-01-28 LAB — CBC WITH DIFFERENTIAL/PLATELET
Abs Immature Granulocytes: 0.01 10*3/uL (ref 0.00–0.07)
Basophils Absolute: 0 10*3/uL (ref 0.0–0.1)
Basophils Relative: 0 %
Eosinophils Absolute: 0 10*3/uL (ref 0.0–0.5)
Eosinophils Relative: 1 %
HCT: 35.6 % — ABNORMAL LOW (ref 39.0–52.0)
Hemoglobin: 11.9 g/dL — ABNORMAL LOW (ref 13.0–17.0)
Immature Granulocytes: 0 %
Lymphocytes Relative: 11 %
Lymphs Abs: 0.3 10*3/uL — ABNORMAL LOW (ref 0.7–4.0)
MCH: 25.4 pg — ABNORMAL LOW (ref 26.0–34.0)
MCHC: 33.4 g/dL (ref 30.0–36.0)
MCV: 75.9 fL — ABNORMAL LOW (ref 80.0–100.0)
Monocytes Absolute: 0 10*3/uL — ABNORMAL LOW (ref 0.1–1.0)
Monocytes Relative: 0 %
Neutro Abs: 2.5 10*3/uL (ref 1.7–7.7)
Neutrophils Relative %: 88 %
Platelets: 133 10*3/uL — ABNORMAL LOW (ref 150–400)
RBC: 4.69 MIL/uL (ref 4.22–5.81)
RDW: 17.8 % — ABNORMAL HIGH (ref 11.5–15.5)
Smear Review: NORMAL
WBC Morphology: INCREASED
WBC: 2.9 10*3/uL — ABNORMAL LOW (ref 4.0–10.5)
nRBC: 0 % (ref 0.0–0.2)

## 2020-01-28 LAB — URINALYSIS, COMPLETE (UACMP) WITH MICROSCOPIC
Bilirubin Urine: NEGATIVE
Glucose, UA: NEGATIVE mg/dL
Ketones, ur: NEGATIVE mg/dL
Nitrite: NEGATIVE
Protein, ur: >=300 mg/dL — AB
RBC / HPF: >50 RBC/hpf — ABNORMAL HIGH (ref 0–5)
Specific Gravity, Urine: 1.02 (ref 1.005–1.030)
WBC, UA: >50 WBC/hpf — ABNORMAL HIGH (ref 0–5)
pH: 6 (ref 5.0–8.0)

## 2020-01-28 LAB — TROPONIN I (HIGH SENSITIVITY)
Troponin I (High Sensitivity): 48 ng/L — ABNORMAL HIGH (ref <18)
Troponin I (High Sensitivity): 72 ng/L — ABNORMAL HIGH (ref <18)
Troponin I (High Sensitivity): 65 ng/L — ABNORMAL HIGH (ref <18)
Troponin I (High Sensitivity): 46 ng/L — ABNORMAL HIGH
Troponin I (High Sensitivity): 44 ng/L — ABNORMAL HIGH (ref <18)

## 2020-01-28 LAB — LACTIC ACID, PLASMA
Lactic Acid, Venous: 2.1 mmol/L (ref 0.5–1.9)
Lactic Acid, Venous: 1.9 mmol/L (ref 0.5–1.9)
Lactic Acid, Venous: 1.8 mmol/L (ref 0.5–1.9)

## 2020-01-28 LAB — C DIFFICILE QUICK SCREEN W PCR REFLEX
C Diff antigen: POSITIVE — AB
C Diff toxin: NEGATIVE

## 2020-01-28 LAB — PROTIME-INR
INR: 1.3 — ABNORMAL HIGH (ref 0.8–1.2)
Prothrombin Time: 15.4 seconds — ABNORMAL HIGH (ref 11.4–15.2)

## 2020-01-28 LAB — BRAIN NATRIURETIC PEPTIDE: B Natriuretic Peptide: 276.3 pg/mL — ABNORMAL HIGH (ref 0.0–100.0)

## 2020-01-28 LAB — SARS CORONAVIRUS 2 BY RT PCR (HOSPITAL ORDER, PERFORMED IN ~~LOC~~ HOSPITAL LAB): SARS Coronavirus 2: NEGATIVE

## 2020-01-28 LAB — CLOSTRIDIUM DIFFICILE BY PCR, REFLEXED: Toxigenic C. Difficile by PCR: NEGATIVE

## 2020-01-28 MED ORDER — DICLOFENAC SODIUM 1 % EX GEL
2.0000 g | Freq: Four times a day (QID) | CUTANEOUS | Status: DC | PRN
  Filled 2020-01-28: qty 100

## 2020-01-28 MED ORDER — ACETAMINOPHEN 500 MG PO TABS
1000.0000 mg | ORAL_TABLET | Freq: Once | ORAL | Status: AC
  Administered 2020-01-28: 1000 mg via ORAL
  Filled 2020-01-28: qty 2

## 2020-01-28 MED ORDER — ACETAMINOPHEN 325 MG PO TABS
650.0000 mg | ORAL_TABLET | Freq: Four times a day (QID) | ORAL | Status: DC | PRN
  Administered 2020-01-29 – 2020-02-03 (×4): 650 mg via ORAL
  Filled 2020-01-28 (×4): qty 2

## 2020-01-28 MED ORDER — ASPIRIN EC 81 MG PO TBEC
81.0000 mg | DELAYED_RELEASE_TABLET | Freq: Every day | ORAL | Status: DC
  Administered 2020-01-28 – 2020-02-03 (×7): 81 mg via ORAL
  Filled 2020-01-28 (×7): qty 1

## 2020-01-28 MED ORDER — CYCLOBENZAPRINE HCL 10 MG PO TABS: 5.0000 mg | ORAL_TABLET | Freq: Three times a day (TID) | ORAL | Status: DC | PRN

## 2020-01-28 MED ORDER — ONDANSETRON HCL 4 MG/2ML IJ SOLN: 4.0000 mg | Freq: Three times a day (TID) | INTRAMUSCULAR | Status: DC | PRN

## 2020-01-28 MED ORDER — SODIUM CHLORIDE 0.9 % IV SOLN
1.0000 g | INTRAVENOUS | Status: DC
  Administered 2020-01-28: 1 g via INTRAVENOUS
  Filled 2020-01-28: qty 10

## 2020-01-28 MED ORDER — ACETAMINOPHEN 325 MG PO TABS: 650.0000 mg | ORAL_TABLET | Freq: Four times a day (QID) | ORAL | Status: DC | PRN

## 2020-01-28 MED ORDER — ALLOPURINOL 100 MG PO TABS
300.0000 mg | ORAL_TABLET | Freq: Every day | ORAL | Status: DC
  Administered 2020-01-28 – 2020-02-03 (×7): 300 mg via ORAL
  Filled 2020-01-28: qty 1
  Filled 2020-01-28 (×5): qty 3
  Filled 2020-01-28: qty 1
  Filled 2020-01-28 (×2): qty 3

## 2020-01-28 MED ORDER — INFLUENZA VAC A&B SA ADJ QUAD 0.5 ML IM PRSY
0.5000 mL | PREFILLED_SYRINGE | INTRAMUSCULAR | Status: AC
  Administered 2020-02-03: 0.5 mL via INTRAMUSCULAR
  Filled 2020-01-28 (×2): qty 0.5

## 2020-01-28 MED ORDER — LACTATED RINGERS IV BOLUS (SEPSIS)
1000.0000 mL | Freq: Once | INTRAVENOUS | Status: AC
  Administered 2020-01-28: 1000 mL via INTRAVENOUS

## 2020-01-28 MED ORDER — APIXABAN 5 MG PO TABS
5.0000 mg | ORAL_TABLET | Freq: Two times a day (BID) | ORAL | Status: DC
  Administered 2020-01-28 – 2020-02-03 (×13): 5 mg via ORAL
  Filled 2020-01-28 (×13): qty 1

## 2020-01-28 MED ORDER — ACETAMINOPHEN 650 MG RE SUPP: 650.0000 mg | Freq: Four times a day (QID) | RECTAL | Status: DC | PRN

## 2020-01-28 MED ORDER — LACTATED RINGERS IV SOLN: INTRAVENOUS | Status: DC

## 2020-01-28 MED ORDER — SODIUM CHLORIDE 0.9 % IV SOLN
2.0000 g | INTRAVENOUS | Status: DC
  Administered 2020-01-28 – 2020-01-30 (×3): 2 g via INTRAVENOUS
  Filled 2020-01-28 (×4): qty 20

## 2020-01-28 MED ORDER — ATORVASTATIN CALCIUM 20 MG PO TABS
80.0000 mg | ORAL_TABLET | Freq: Every evening | ORAL | Status: DC
  Administered 2020-01-28 – 2020-02-02 (×6): 80 mg via ORAL
  Filled 2020-01-28 (×6): qty 4

## 2020-01-28 MED ORDER — GABAPENTIN 400 MG PO CAPS
400.0000 mg | ORAL_CAPSULE | Freq: Three times a day (TID) | ORAL | Status: DC
  Administered 2020-01-28 – 2020-02-03 (×19): 400 mg via ORAL
  Filled 2020-01-28 (×21): qty 1

## 2020-01-28 MED ORDER — METOPROLOL TARTRATE 25 MG PO TABS
12.5000 mg | ORAL_TABLET | Freq: Two times a day (BID) | ORAL | Status: DC
  Administered 2020-01-28 – 2020-01-30 (×5): 12.5 mg via ORAL
  Filled 2020-01-28 (×5): qty 1

## 2020-01-28 MED ORDER — NICOTINE 7 MG/24HR TD PT24
7.0000 mg | MEDICATED_PATCH | Freq: Every day | TRANSDERMAL | Status: DC
  Administered 2020-01-28 – 2020-02-03 (×7): 7 mg via TRANSDERMAL
  Filled 2020-01-28 (×8): qty 1

## 2020-01-28 MED ORDER — LATANOPROST 0.005 % OP SOLN
1.0000 [drp] | Freq: Every day | OPHTHALMIC | Status: DC
  Administered 2020-01-29 – 2020-02-02 (×6): 1 [drp] via OPHTHALMIC
  Filled 2020-01-28: qty 2.5

## 2020-01-28 MED ORDER — COLCHICINE 0.6 MG PO TABS
0.6000 mg | ORAL_TABLET | Freq: Every day | ORAL | Status: DC | PRN
  Filled 2020-01-28: qty 1

## 2020-01-28 MED ORDER — POTASSIUM CHLORIDE CRYS ER 20 MEQ PO TBCR
30.0000 meq | EXTENDED_RELEASE_TABLET | Freq: Once | ORAL | Status: AC
  Administered 2020-01-28: 30 meq via ORAL
  Filled 2020-01-28: qty 2

## 2020-01-28 NOTE — ED Notes (Signed)
Pt given phone to speak with friend mrs parker

## 2020-01-28 NOTE — ED Notes (Signed)
Dinner tray provided to patient; pt able to feed self.

## 2020-01-28 NOTE — ED Notes (Signed)
Given breakfast tray. 

## 2020-01-28 NOTE — ED Notes (Signed)
Pt is going to CT. Troponin to be collected when pt is back from CT

## 2020-01-28 NOTE — ED Notes (Signed)
RN attempted to call report x1. Floor stated to call back after 5 minutes

## 2020-01-28 NOTE — ED Notes (Signed)
Date and time results received: 01/28/20 0520 (use smartphrase ".now" to insert current time)  Test: Lactic Acid Critical Value: 2.1  Name of Provider Notified: Dr. Karma Greaser  Orders Received? Or Actions Taken?: provider notified

## 2020-01-28 NOTE — ED Notes (Signed)
Charge RN was unable to get a second IV and second set of blood cultures. Provider notified and IV antibiotics started.

## 2020-01-28 NOTE — ED Notes (Signed)
Pt still in CT, unable to obtain repeat troponin at this time

## 2020-01-28 NOTE — ED Triage Notes (Addendum)
Patient coming [facility] from home for difficulty walking for a couple of days per friend. Patient's friend reports confusion beginning this am. Patient with increased urination with foul odor per friend.   Hx DM, HTN, Afib EMS vitals: CBG 144, 100% on RA, 99.1 oral, 163/94, afib on monitor.   Patient oriented to person, place, and situation for this RN. Patient disoriented to time.  Patient c/o chest pain.

## 2020-01-28 NOTE — ED Notes (Signed)
Provider notified of pts work of breathing. Pt placed on 2LPM of oxygen via [place] as per ER provider

## 2020-01-28 NOTE — Progress Notes (Signed)
CODE SEPSIS - PHARMACY COMMUNICATION  **Broad Spectrum Antibiotics should be administered within 1 hour of Sepsis diagnosis**  Time Code Sepsis Called/Page Received: 1735  Antibiotics Ordered: ceftriaxone  Time of 1st antibiotic administration: 0520  Additional action taken by pharmacy:   If necessary, Name of Provider/Nurse Contacted:     Tobie Lords ,PharmD Clinical Pharmacist  01/28/2020  5:34 AM
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
## 2020-01-28 NOTE — ED Notes (Signed)
Pt states coming in with shortness of breath. Pt does not know the year or who the president is. Pt is on cardiac, bp and pulse ox monitoring.  RN attempted 3 times to get IV and second set of blood cultures. RN was unable to. Agricultural consultant at bedside attempting.

## 2020-01-28 NOTE — ED Notes (Signed)
Pt provided lunch tray; able to feed self. 

## 2020-01-28 NOTE — Progress Notes (Signed)
PHARMACY - PHYSICIAN COMMUNICATION CRITICAL VALUE ALERT - BLOOD CULTURE IDENTIFICATION (BCID)  [name] is an 69 y.o. male who presented to [facility] on 01/28/2020 with a chief complaint of altered mental status, fever, chills, dysuria, chest pain, and diarrhea. PMH includes HTN, HLD, diabetes, stroke, gout, PVD, afib, DVT/PE on Eliquis, and CHF. Pt febrile on admission (103.1), LA 2.1, WBC 2.9.   Assessment: 1 anaerobic bottle showing GNRs - e.coli, ESBL not detected.   Name of physician (or Provider) Contacted: [name]  Current antibiotics: ceftriaxone  Changes to prescribed antibiotics recommended:  Increase ceftriaxone to 2 g IV q24h Recommendations accepted by provider  No results found for this or any previous visit.  Benn Moulder, PharmD Pharmacy Resident  01/28/2020 4:54 PM
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
## 2020-01-28 NOTE — ED Notes (Signed)
Pt ambulatory to toilet with cane.

## 2020-01-29 DIAGNOSIS — R6520 Severe sepsis without septic shock: Secondary | ICD-10-CM

## 2020-01-29 DIAGNOSIS — R7881 Bacteremia: Secondary | ICD-10-CM

## 2020-01-29 LAB — MAGNESIUM: Magnesium: 2.1 mg/dL (ref 1.7–2.4)

## 2020-01-29 LAB — HEMOGLOBIN A1C
Hgb A1c MFr Bld: 6.3 % — ABNORMAL HIGH (ref 4.8–5.6)
Mean Plasma Glucose: 134.11 mg/dL

## 2020-01-29 LAB — LIPID PANEL
Cholesterol: 111 mg/dL (ref 0–200)
HDL: <10 mg/dL — ABNORMAL LOW (ref >40)
Triglycerides: 220 mg/dL — ABNORMAL HIGH (ref <150)
VLDL: 44 mg/dL — ABNORMAL HIGH (ref 0–40)

## 2020-01-29 LAB — BLOOD CULTURE ID PANEL (REFLEXED) - BCID2

## 2020-01-29 LAB — CBC
HCT: 32.3 % — ABNORMAL LOW (ref 39.0–52.0)
Hemoglobin: 11.3 g/dL — ABNORMAL LOW (ref 13.0–17.0)
MCH: 25.9 pg — ABNORMAL LOW (ref 26.0–34.0)
MCHC: 35 g/dL (ref 30.0–36.0)
MCV: 74.1 fL — ABNORMAL LOW (ref 80.0–100.0)
Platelets: 118 10*3/uL — ABNORMAL LOW (ref 150–400)
RBC: 4.36 MIL/uL (ref 4.22–5.81)
RDW: 17.8 % — ABNORMAL HIGH (ref 11.5–15.5)
WBC: 10.9 10*3/uL — ABNORMAL HIGH (ref 4.0–10.5)
nRBC: 0 % (ref 0.0–0.2)

## 2020-01-29 LAB — BASIC METABOLIC PANEL
Anion gap: 11 (ref 5–15)
BUN: 25 mg/dL — ABNORMAL HIGH (ref 8–23)
CO2: 23 mmol/L (ref 22–32)
Calcium: 8.4 mg/dL — ABNORMAL LOW (ref 8.9–10.3)
Chloride: 101 mmol/L (ref 98–111)
Creatinine, Ser: 0.99 mg/dL (ref 0.61–1.24)
GFR calc Af Amer: >60 mL/min (ref >60)
GFR calc non Af Amer: >60 mL/min (ref >60)
Glucose, Bld: 108 mg/dL — ABNORMAL HIGH (ref 70–99)
Potassium: 4 mmol/L (ref 3.5–5.1)
Sodium: 135 mmol/L (ref 135–145)

## 2020-01-29 NOTE — Progress Notes (Signed)
PROGRESS NOTE    [name]  FIE:[id number] DOB: [birth year] DOA: 01/28/2020 PCP: Theotis Burrow, MD   Assessment & Plan:   Principal Problem:   UTI (urinary tract infection) Active Problems:   Cerebral infarction [facility])   DVT (deep venous thrombosis) ([place])   Essential hypertension   Gout   Pulmonary embolism (HCC)   Chronic a-fib (HCC)   Severe sepsis (HCC)   Tobacco abuse   Elevated troponin   Hypokalemia   Diarrhea   Chest pain   Chronic combined systolic (congestive) and diastolic (congestive) heart failure (HCC)   Acute metabolic encephalopathy   Severe sepsis: due to UTI & bacteremia.  Meets criteria w/ WBC 2.9, tachycardia, tachypnea. Lactic acid was elevated. Continue on IV rocephin. Blood cxs growing gram neg rods. Urine cx growing e.coli, sens pending   Bacteremia: blood cxs growing e. coli, sens pending. Continue on IV rocpehin   UTI: urine cx is growing e. coli, sens pending. Continue on IV rocephin  Leukocytosis: secondary to above infections. Continue on IV abxs  Thrombocytopenia: etiology unclear, will continue to monitor   Chest pain: w/ elevated troponin. Likely due to demand ischemia. Trending down   CVA: continue on home dose of aspirin, statin. Pt still drives sometimes and I strongly encouraged pt not to drive at all as he is putting himself and others on the road at risk/danger as pt recently had a CVA and does not see well among other comorbidities   Hx of DVT & PE: continue on home dose of eliquis   HTN: continue on home dose of metoprolol. Hold home dose of cozarr   Chronic a-fib : continue on home dose of metoprolol, eliquis   Gout: continue on home dose of allopurinol, colchicine   Tobacco abuse: nicotine patch to prevent w/drawal. Smoking cessation counseling   Hypokalemia: WNL today. Will continue to monitor   Diarrhea and abdominal pain: possibly due to viral enteritis. C. Diff, GI PCR panel are all neg    Chronic combined CHF: echo on 09/13/2019 showed EF of 40-45% with grade 1 diastolic dysfunction.  Pt seems euvolemia. Will hold lasix   Acute metabolic encephalopathy: likely secondary to UTI. CT brain shows no acute intracranial findings but remote b/l PCA & left MCA branch  Infarcts    DVT prophylaxis: eliquis  Code Status: full  Family Communication: Disposition Plan: likely d/c back home, depends on PT/OT recs  Status is: Inpatient  Remains inpatient appropriate because:Ongoing diagnostic testing needed not appropriate for outpatient work up   Dispo: The patient is from: Home              Anticipated d/c is to: Home vs SNF, depends on PT/OT recs               Anticipated d/c date is: 3 days              Patient currently is not medically stable to d/c.     Consultants:      Procedures:    Antimicrobials: rocephin   Subjective: Pt c/o malaise   Objective: Vitals:   01/28/20 2027 01/28/20 2152 01/29/20 0418 01/29/20 0747  BP: 115/82 133/81 128/83 132/83  Pulse: 86 74 84 77  Resp: 12 16 16 14   Temp: 97.9 F (36.6 C) 97.8 F (36.6 C) 97.7 F (36.5 C) (!) 97.4 F (36.3 C)  TempSrc:    Oral  SpO2: 100% 99% 100% 100%  Weight:  97.9 kg    Height:  6\' 2"  (1.88 m)      Intake/Output Summary (Last 24 hours) at 01/29/2020 0812 Last data filed at 01/29/2020 0700 Gross per 24 hour  Intake 1346.35 ml  Output 300 ml  Net 1046.35 ml   Filed Weights   01/28/20 0430 01/28/20 2152  Weight: 92.6 kg 97.9 kg    Examination:  General exam: Appears calm and comfortable  Respiratory system: Clear to auscultation. Respiratory effort normal. Cardiovascular system: irregularly irregular. No  rubs, gallops or clicks. Gastrointestinal system: Abdomen is nondistended, soft and nontender. Normal bowel sounds heard. Central nervous system: Alert and oriented. Moves all 4 extremities  Psychiatry: Judgement and insight appear normal. Flat mood and affect .     Data  Reviewed: I have personally reviewed following labs and imaging studies  CBC: Recent Labs  Lab 01/28/20 0444 01/29/20 0611  WBC 2.9* 10.9*  NEUTROABS 2.5  --   HGB 11.9* 11.3*  HCT 35.6* 32.3*  MCV 75.9* 74.1*  PLT 133* 992*   Basic Metabolic Panel: Recent Labs  Lab 01/28/20 0444 01/29/20 0611  NA 133* 135  K 3.4* 4.0  CL 99 101  CO2 24 23  GLUCOSE 110* 108*  BUN 20 25*  CREATININE 1.11 0.99  CALCIUM 8.6* 8.4*  MG  --  2.1   GFR: Estimated Creatinine Clearance: 79.6 mL/min (by C-G formula based on SCr of 0.99 mg/dL). Liver Function Tests: Recent Labs  Lab 01/28/20 0444  AST 31  ALT 22  ALKPHOS 123  BILITOT 2.1*  PROT 7.4  ALBUMIN 3.5   No results for input(s): LIPASE, AMYLASE in the last 168 hours. No results for input(s): AMMONIA in the last 168 hours. Coagulation Profile: Recent Labs  Lab 01/28/20 0444  INR 1.3*   Cardiac Enzymes: No results for input(s): CKTOTAL, CKMB, CKMBINDEX, TROPONINI in the last 168 hours. BNP (last 3 results) No results for input(s): PROBNP in the last 8760 hours. HbA1C: No results for input(s): HGBA1C in the last 72 hours. CBG: No results for input(s): GLUCAP in the last 168 hours. Lipid Profile: Recent Labs    01/29/20 0611  CHOL 111  HDL <10*  LDLCALC NOT CALCULATED  TRIG 220*  CHOLHDL NOT CALCULATED   Thyroid Function Tests: No results for input(s): TSH, T4TOTAL, FREET4, T3FREE, THYROIDAB in the last 72 hours. Anemia Panel: No results for input(s): VITAMINB12, FOLATE, FERRITIN, TIBC, IRON, RETICCTPCT in the last 72 hours. Sepsis Labs: Recent Labs  Lab 01/28/20 0444 01/28/20 0628 01/28/20 0845  LATICACIDVEN 2.1* 1.9 1.8    Recent Results (from the past 240 hour(s))  Culture, blood (Routine x 2)     Status: None (Preliminary result)   Collection Time: 01/28/20  4:44 AM   Specimen: BLOOD  Result Value Ref Range Status   Specimen Description   Final    BLOOD LEFT ANTECUBITAL Performed at [facility], [address], [place], [place] [address]    Special Requests   Final    BOTTLES DRAWN AEROBIC AND ANAEROBIC Blood Culture results may not be optimal due to an excessive volume of blood received in culture bottles Performed at [facility], [address], [place], [place] [address]    Culture  Setup Time   Final    ANAEROBIC BOTTLE ONLY GRAM NEGATIVE RODS Organism ID to follow CRITICAL RESULT CALLED TO, READ BACK BY AND VERIFIED WITH: MORGAN CUNNINGHAM @1652  01/28/20 MJU    Culture GRAM NEGATIVE RODS  Final   Report Status PENDING  Incomplete  Blood Culture ID Panel (Reflexed)     Status: Abnormal   Collection Time: 01/28/20  4:44 AM  Result Value Ref Range Status   Enterococcus faecalis NOT DETECTED NOT DETECTED Final   Enterococcus Faecium NOT DETECTED NOT DETECTED Final   Listeria monocytogenes NOT DETECTED NOT DETECTED Final   Staphylococcus species NOT DETECTED NOT DETECTED Final   Staphylococcus aureus (BCID) NOT DETECTED NOT DETECTED Final   Staphylococcus epidermidis NOT DETECTED NOT DETECTED Final   Staphylococcus lugdunensis NOT DETECTED NOT DETECTED Final   Streptococcus species NOT DETECTED NOT DETECTED Final   Streptococcus agalactiae NOT DETECTED NOT DETECTED Final   Streptococcus pneumoniae NOT DETECTED NOT DETECTED Final   Streptococcus pyogenes NOT DETECTED NOT DETECTED Final   A.calcoaceticus-baumannii NOT DETECTED NOT DETECTED Final   Bacteroides fragilis NOT DETECTED NOT DETECTED Final   Enterobacterales DETECTED (A) NOT DETECTED Final    Comment: Enterobacterales represent a large order of gram negative bacteria, not a single organism. CRITICAL RESULT CALLED TO, READ BACK BY AND VERIFIED WITH: MORGAN CUNNINGHAMM AT [place] [facility] 01/28/20 MJU/SDR    Enterobacter cloacae complex NOT DETECTED NOT DETECTED Final   Escherichia coli DETECTED (A) NOT DETECTED Final    Comment: CRITICAL RESULT CALLED TO, READ BACK BY AND VERIFIED WITH: MORGAN  CUNNINGHAM @1652  01/28/20 MJU    Klebsiella aerogenes NOT DETECTED NOT DETECTED Final   Klebsiella oxytoca NOT DETECTED NOT DETECTED Final   Klebsiella pneumoniae NOT DETECTED NOT DETECTED Final   Proteus species NOT DETECTED NOT DETECTED Final   Salmonella species NOT DETECTED NOT DETECTED Final   Serratia marcescens NOT DETECTED NOT DETECTED Final   Haemophilus influenzae NOT DETECTED NOT DETECTED Final   Neisseria meningitidis NOT DETECTED NOT DETECTED Final   Pseudomonas aeruginosa NOT DETECTED NOT DETECTED Final   Stenotrophomonas maltophilia NOT DETECTED NOT DETECTED Final   Candida albicans NOT DETECTED NOT DETECTED Final   Candida auris NOT DETECTED NOT DETECTED Final   Candida glabrata NOT DETECTED NOT DETECTED Final   Candida krusei NOT DETECTED NOT DETECTED Final   Candida parapsilosis NOT DETECTED NOT DETECTED Final   Candida tropicalis NOT DETECTED NOT DETECTED Final   Cryptococcus neoformans/gattii NOT DETECTED NOT DETECTED Final   CTX-M ESBL NOT DETECTED NOT DETECTED Final   Carbapenem resistance IMP NOT DETECTED NOT DETECTED Final   Carbapenem resistance KPC NOT DETECTED NOT DETECTED Final   Carbapenem resistance NDM NOT DETECTED NOT DETECTED Final   Carbapenem resist OXA 48 LIKE NOT DETECTED NOT DETECTED Final   Carbapenem resistance VIM NOT DETECTED NOT DETECTED Final    Comment: Performed at [facility], [address] [place], [place] [address]  Culture, blood (Routine x 2)     Status: None (Preliminary result)   Collection Time: 01/28/20  5:09 AM   Specimen: BLOOD  Result Value Ref Range Status   Specimen Description BLOOD RIGHT ANTECUBITAL  Final   Special Requests   Final    BOTTLES DRAWN AEROBIC AND ANAEROBIC Blood Culture results may not be optimal due to an excessive volume of blood received in culture bottles   Culture   Final    NO GROWTH < 24 HOURS Performed at [facility], [address] [place], [place] [address]    Report  Status PENDING  Incomplete  SARS Coronavirus 2 by RT PCR (hospital order, performed in [place] hospital lab) Nasopharyngeal Nasopharyngeal Swab     Status: None   Collection Time: 01/28/20  5:09 AM   Specimen: Nasopharyngeal Swab  Result Value Ref Range Status   SARS Coronavirus 2 NEGATIVE NEGATIVE Final    Comment: (NOTE) SARS-CoV-2 target nucleic acids are NOT DETECTED.  The SARS-CoV-2 RNA is generally detectable in upper and lower respiratory specimens during the acute phase of infection. The lowest concentration of SARS-CoV-2 viral copies this assay can detect is 250 copies / mL. A negative result does not preclude SARS-CoV-2 infection and should not be used as the sole basis for treatment or other patient management decisions.  A negative result may occur with improper specimen collection / handling, submission of specimen other than nasopharyngeal swab, presence of viral mutation(s) within the areas targeted by this assay, and inadequate number of viral copies (<250 copies / mL). A negative result must be combined with clinical observations, patient history, and epidemiological information.  Fact Sheet for Patients:   StrictlyIdeas.no  Fact Sheet for Healthcare Providers: BankingDealers.co.za  This test is not yet approved or  cleared by the [place] FDA and has been authorized for detection and/or diagnosis of SARS-CoV-2 by FDA under an Emergency Use Authorization (EUA).  This EUA will remain in effect (meaning this test can be used) for the duration of the COVID-19 declaration under Section 564(b)(1) of the Act, 21 U.S.C. section 360bbb-3(b)(1), unless the authorization is terminated or revoked sooner.  Performed at [facility], [place], [place] [address]   C Difficile Quick Screen w PCR reflex     Status: Abnormal   Collection Time: 01/28/20  6:31 PM   Specimen: STOOL  Result Value Ref  Range Status   C Diff antigen POSITIVE (A) NEGATIVE Final   C Diff toxin NEGATIVE NEGATIVE Final   C Diff interpretation Results are indeterminate. See PCR results.  Final    Comment: Performed at [facility], [address] [place], [place] [address]  Gastrointestinal Panel by PCR , Stool     Status: None   Collection Time: 01/28/20  6:31 PM   Specimen: STOOL  Result Value Ref Range Status   Campylobacter species NOT DETECTED NOT DETECTED Final   Plesimonas shigelloides NOT DETECTED NOT DETECTED Final   Salmonella species NOT DETECTED NOT DETECTED Final   Yersinia enterocolitica NOT DETECTED NOT DETECTED Final   Vibrio species NOT DETECTED NOT DETECTED Final   Vibrio cholerae NOT DETECTED NOT DETECTED Final   Enteroaggregative E coli (EAEC) NOT DETECTED NOT DETECTED Final   Enteropathogenic E coli (EPEC) NOT DETECTED NOT DETECTED Final   Enterotoxigenic E coli (ETEC) NOT DETECTED NOT DETECTED Final   Shiga like toxin producing E coli (STEC) NOT DETECTED NOT DETECTED Final   Shigella/Enteroinvasive E coli (EIEC) NOT DETECTED NOT DETECTED Final   Cryptosporidium NOT DETECTED NOT DETECTED Final   Cyclospora cayetanensis NOT DETECTED NOT DETECTED Final   Entamoeba histolytica NOT DETECTED NOT DETECTED Final   Giardia lamblia NOT DETECTED NOT DETECTED Final   Adenovirus F40/41 NOT DETECTED NOT DETECTED Final   Astrovirus NOT DETECTED NOT DETECTED Final   Norovirus GI/GII NOT DETECTED NOT DETECTED Final   Rotavirus A NOT DETECTED NOT DETECTED Final   Sapovirus (I, II, IV, and V) NOT DETECTED NOT DETECTED Final    Comment: Performed at [facility], [address] [place],  [address]  C. Diff by PCR, Reflexed     Status: None   Collection Time: 01/28/20  6:31 PM  Result Value Ref Range Status   Toxigenic C. Difficile by PCR NEGATIVE NEGATIVE Final    Comment: Patient  is colonized with non toxigenic C. difficile. May not need treatment unless significant  symptoms are present. Performed at [facility], [address] [place], [place] [address]          Radiology Studies: CT HEAD WO CONTRAST  Result Date: 01/28/2020 CLINICAL DATA:  Mental status change with unknown cause EXAM: CT HEAD WITHOUT CONTRAST TECHNIQUE: Contiguous axial images were obtained from the base of the skull through the vertex without intravenous contrast. COMPARISON:  09/13/2019 FINDINGS: Brain: The right PCA territory infarct at the occipital lobe has evolved as expected since prior. Remote left occipital and left frontal infarcts as previously seen. Small remote right superior cerebellar infarct. No visible acute infarct, hemorrhage, hydrocephalus, or collection. Vascular: No hyperdense vessel or unexpected calcification. Skull: Hyperostosis interna Sinuses/Orbits: No acute finding IMPRESSION: 1. No acute finding. 2. Remote bilateral PCA and left MCA branch infarcts. Electronically Signed   By: Monte Fantasia M.D.   On: 01/28/2020 10:09   DG Chest Port 1 View  Result Date: 01/28/2020 CLINICAL DATA:  Confusion. EXAM: PORTABLE CHEST 1 VIEW COMPARISON:  04/08/2019 FINDINGS: The heart is enlarged but stable. Prominent mediastinal and hilar contours are unchanged. Moderate tortuosity of the thoracic aorta. Chronic lung changes but no definite acute overlying pulmonary process. Evidence of remote trauma involving the right shoulder area. No acute bony findings. IMPRESSION: Chronic lung changes but no acute pulmonary findings. Electronically Signed   By: Marijo Sanes M.D.   On: 01/28/2020 05:33   CT Renal Stone Study  Result Date: 01/28/2020 CLINICAL DATA:  UTI, sepsis.  Rule out kidney stone. EXAM: CT ABDOMEN AND PELVIS WITHOUT CONTRAST TECHNIQUE: Multidetector CT imaging of the abdomen and pelvis was performed following the standard protocol without IV contrast. COMPARISON:  CT abdomen pelvis 04/07/2019 FINDINGS: Lower chest: Lung bases clear bilaterally. Cardiac  enlargement. Enlargement of the right atrium and right ventricle similar to the prior study. Hepatobiliary: Punctate calcification in the inferior right lobe of the liver unchanged from prior study. No liver mass. Gallbladder and bile ducts normal. Pancreas: Negative Spleen: Negative Adrenals/Urinary Tract: Negative for renal hydronephrosis. Chronic infarct left upper renal pole with associated punctate calcification. Right kidney normal. No ureteral or bladder calculus. Mild bladder wall thickening. Stomach/Bowel: Negative for bowel obstruction. No bowel mass or edema. Appendix nonvisualized. Negative for diverticulitis. Vascular/Lymphatic: Atherosclerotic aorta and iliacs without aneurysm. No adenopathy. IVC filter unchanged in position at the L2-3 level. Reproductive: Prostate enlargement measuring 4.7 by 5.9 cm. Other: No free fluid.  Negative for hernia. Musculoskeletal: No acute skeletal abnormality. Lumbar degenerative changes with severe facet degeneration at L4-5 and L5-S1. IMPRESSION: 1. Negative for obstructing urinary tract calculus. 2. Chronic infarct left upper pole with punctate calcification. No other renal calculi. Electronically Signed   By: Franchot Gallo M.D.   On: 01/28/2020 07:30        Scheduled Meds: . allopurinol  300 mg Oral Daily  . apixaban  5 mg Oral BID  . aspirin EC  81 mg Oral Daily  . atorvastatin  80 mg Oral QPM  . gabapentin  400 mg Oral TID  . influenza vaccine adjuvanted  0.5 mL Intramuscular Tomorrow-1000  . latanoprost  1 drop Both Eyes QHS  . metoprolol tartrate  12.5 mg Oral BID  . nicotine  7 mg Transdermal Daily   Continuous Infusions: . cefTRIAXone (ROCEPHIN)  IV Stopped (01/28/20 1859)  . lactated ringers 75 mL/hr at 01/29/20 0300     LOS: 1 day  Time spent: 35 mins     Wyvonnia Dusky, MD [facility] Pager [phone]  If 7PM-7AM, please contact night-coverage [email or web address] 01/29/2020, 8:12 AM

## 2020-01-29 NOTE — TOC Initial Note (Addendum)
Transition of Care [facility]) - Initial/Assessment Note    Patient Details  Name: [name] MRN: [id number] Date of Birth: May 12, 1951  [facility] CM/SW Contact:    Beverly Sessions, RN Phone Number: 01/29/2020, 12:54 PM  Clinical Narrative:                 Patient admitted from home with UTI Patient states that he lives at home alone.   PCP [place] - patient states that his girlfriend provides transportation to all appointments  Pharmacy [facility] - Denies issues obtaining medications  Patient states that he ambulates with a cane and does not have any other DME.    PT eval pending   Update:  PT and OT has assessed patient and recommends home health.  Patient agreeable.  States he does not have a preference of home health agency.  Referral made to [facility] with [place].  He is checking to see if they can accept patient.  Referral mae to [facility] with [facility] for [facility]  Expected Discharge Plan: [place] Barriers to Discharge: Continued Medical Work up   Patient Goals and CMS Choice   CMS Medicare.gov Compare Post Acute Care list provided to:: Patient Choice offered to / list presented to : Patient  Expected Discharge Plan and Services Expected Discharge Plan: [place]       Living arrangements for the past 2 months: Apartment                                      Prior Living Arrangements/Services Living arrangements for the past 2 months: Apartment Lives with:: Self Patient language and need for interpreter reviewed:: Yes Do you feel safe going back to the place where you live?: Yes      Need for Family Participation in Patient Care: Yes (Comment) Care giver support system in place?: Yes (comment) Current home services: DME Criminal Activity/Legal Involvement Pertinent to Current Situation/Hospitalization: No - Comment as needed  Activities of Daily Living Home Assistive Devices/Equipment: Cane  (specify quad or straight) ADL Screening (condition at time of admission) Patient's cognitive ability adequate to safely complete daily activities?: Yes Is the patient deaf or have difficulty hearing?: No Does the patient have difficulty seeing, even when wearing glasses/contacts?: No Does the patient have difficulty concentrating, remembering, or making decisions?: No Patient able to express need for assistance with ADLs?: Yes Does the patient have difficulty dressing or bathing?: No Independently performs ADLs?: Yes (appropriate for developmental age) Does the patient have difficulty walking or climbing stairs?: Yes Weakness of Legs: Both Weakness of Arms/Hands: None  Permission Sought/Granted                  Emotional Assessment Appearance:: Appears stated age Attitude/Demeanor/Rapport: Gracious Affect (typically observed): Accepting Orientation: : Oriented to Self, Oriented to Place, Oriented to  Time, Oriented to Situation   Psych Involvement: No (comment)  Admission diagnosis:  Delirium [R41.0] UTI (urinary tract infection) [N39.0] Elevated troponin level [R77.8] Total bilirubin, elevated [R17] Sepsis ([place]) [A41.9] Urinary tract infection with hematuria, site unspecified [N39.0, R31.9] Sepsis, due to unspecified organism, unspecified whether acute organ dysfunction present [facility]) [A41.9] Patient Active Problem List   Diagnosis Date Noted  . Elevated troponin 01/28/2020  . UTI (urinary tract infection) 01/28/2020  . Hypokalemia 01/28/2020  . Diarrhea 01/28/2020  . Chest pain 01/28/2020  . Chronic  combined systolic (congestive) and diastolic (congestive) heart failure ([place]) 01/28/2020  . Acute metabolic encephalopathy 17/71/1657  . Coagulation disorder ([place]) 11/29/2019  . AKI (acute kidney injury) ([place])   . History of cocaine abuse ([place])   . Acute ischemic right PCA stroke ([place]) 09/13/2019  . Vision loss, bilateral   . Tobacco abuse   . Bradycardia   .  Non-sustained ventricular tachycardia ([place])   . Dizziness 09/12/2019  . Nonintractable headache   . Slurred speech   . Type 2 diabetes mellitus with hyperlipidemia ([place])   . PAD (peripheral artery disease) ([place]) 05/20/2019  . Chronic a-fib ([place]) 04/08/2019  . Fever 04/08/2019  . Severe sepsis ([place]) 04/08/2019  . Abdominal pain 04/07/2019  . 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 Pain due to onychomycosis of toenails of both feet 11/20/2018  . Diabetic neuropathy ([place]) 11/20/2018  . Influenza A 06/22/2018  . Community acquired pneumonia 06/22/2018  . Arthritis 05/12/2017  . Varicose veins of leg with swelling, bilateral 01/18/2017  . Facial twitching   . 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 Pneumonia 10/12/2014  . Osteoarthritis of right knee 02/11/2014  . Pain and swelling of knee 01/08/2014  . Pain in extremity 01/08/2014  . Weakness due to cerebrovascular accident 01/08/2014  . Pulmonary embolism ([place]) 06/19/2012  . Gout 09/22/2011  . 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 Cerebral infarction ([place]) 04/26/2011  . Type 2 diabetes mellitus without complication ([place]) 90/38/3338  . Accelerated hypertension 04/26/2011  . Anemia 04/26/2011  . DVT (deep venous thrombosis) ([place]) 04/26/2011  . Essential hypertension 04/26/2011   PCP:  Theotis Burrow, MD Pharmacy:   [facility] [address] [place]) [place] [address] Phone: [phone] Fax: [phone]     Social Determinants of Health (SDOH) Interventions    Readmission Risk Interventions Readmission Risk Prevention Plan 01/29/2020  Transportation Screening Complete  Palliative Care Screening Not Applicable  Medication Review (RN Care Manager) Complete  Some recent data might be hidden

## 2020-01-29 NOTE — Evaluation (Signed)
Occupational Therapy Evaluation Patient Details Name: [name] MRN: [id number] DOB: 1951-01-06 Today's Date: 01/29/2020    History of Present Illness [name] is a 69 y.o. male with medical history significant of hypertension, hyperlipidemia, diet-controlled diabetes, stroke, gout, PVD, varicose vein legs, atrial fibrillation, PE/DVT on Eliquis, left renal infarct, tobacco abuse, CHF with EF of 40-45%, who presents with altered mental status, fever, chills, chest pain, diarrhea.   Clinical Impression   Pt was seen for OT evaluation this date. Prior to hospital admission, pt was relatively independent with basic ADL, getting assist from girlfriend for meal prep, cleaning, and med mgt per pt report. Pt endorses getting help with transportation to appointments. No family/caregiver present to verify information. Pt alert and pleasant, follows commands. HOH vs slight increased processing time for questions/commands but performs well. Supervision for ADL transfers for safety. Pt instructed in DME to increase toilet height at home for improved safety with toileting and to minimize risk of falls/caregiver burden. Pt verbalized understanding. Currently pt demonstrates impairments as described below (See OT problem list) which functionally limit his ability to perform ADL/self-care tasks at baseline independence. Pt would benefit from skilled OT to address noted impairments and functional limitations (see below for any additional details) in order to maximize safety and independence while minimizing falls risk and caregiver burden. Upon hospital discharge, recommend HHOT to maximize pt safety and return to functional independence during meaningful occupations of daily life. RNCM notified of follow up recommendations.     Follow Up Recommendations  Home health OT    Equipment Recommendations  3 in 1 bedside commode    Recommendations for Other Services       Precautions / Restrictions  Precautions Precautions: Fall Restrictions Weight Bearing Restrictions: No      Mobility Bed Mobility Overal bed mobility: Modified Independent             General bed mobility comments: HOB elevated, no physical assist  Transfers Overall transfer level: Modified independent Equipment used: Straight cane                  Balance Overall balance assessment: Needs assistance Sitting-balance support: Feet supported;No upper extremity supported Sitting balance-Leahy Scale: Good     Standing balance support: No upper extremity supported;During functional activity Standing balance-Leahy Scale: Fair Standing balance comment: slight unsteady, feeling weak with ambulation                           ADL either performed or assessed with clinical judgement   ADL Overall ADL's : Needs assistance/impaired                                       General ADL Comments: Supervision to [place] for ADL Transfers for safety     Vision Patient Visual Report: No change from baseline       Perception     Praxis      Pertinent Vitals/Pain Pain Assessment: No/denies pain Faces Pain Scale: Hurts a little bit Pain Location: stomach Pain Descriptors / Indicators: Discomfort Pain Intervention(s): Monitored during session     Hand Dominance Right   Extremity/Trunk Assessment Upper Extremity Assessment Upper Extremity Assessment: Overall WFL for tasks assessed   Lower Extremity Assessment Lower Extremity Assessment: Generalized weakness (grossly at least 4/5  bilat)   Cervical / Trunk Assessment Cervical / Trunk Assessment: Normal  Communication Communication Communication: HOH   Cognition Arousal/Alertness: Awake/alert Behavior During Therapy: WFL for tasks assessed/performed Overall Cognitive Status: Within Functional Limits for tasks assessed                                 General Comments: HOH vs mild increased processing  time required, but appropriate throughout and follows all commands   General Comments       Exercises Other Exercises Other Exercises: Pt educated in DME to elevate toilet for improved safety with toilet transfers at home   Shoulder Instructions      [place] expects to be discharged to:: Private residence Living Arrangements: Alone Available Help at Discharge: Friend(s);Available PRN/intermittently Type of Home: Apartment Home Access: Level entry     Home Layout: One level     Bathroom Shower/Tub: Teacher, early years/pre: Standard     Home Equipment: Cane - single point;Grab bars - tub/shower          Prior Functioning/Environment Level of Independence: Independent with assistive device(s)        Comments: From previous chart review, Pt reports he is active and independent at baseline. Manages all BADL tasks independenty, Pt reports he gets help for transportation and girlfriend assists with cooking and cleaning. Endorses a few near miss falls onto his bed, but no significant falls in the past year.        OT Problem List: Decreased strength;Impaired balance (sitting and/or standing);Decreased activity tolerance;Decreased safety awareness      OT Treatment/Interventions: Self-care/ADL training;Therapeutic exercise;Therapeutic activities;Energy conservation;DME and/or AE instruction;Patient/family education;Balance training    OT Goals(Current goals can be found in the care plan section) Acute Rehab OT Goals Patient Stated Goal: to return home OT Goal Formulation: With patient Time For Goal Achievement: 02/12/20 Potential to Achieve Goals: Good ADL Goals Additional ADL Goal #1: Pt will perform UB/LB dressing with STS transfer with modified independence and no LOB. Additional ADL Goal #2: Pt will perform seated UB/LB bathing with STS transfer with modified independence and no LOB. Additional ADL Goal #3: Pt will verbalize plan to  implement at least 2 learned energy conservation strategies to maximize safety/indep with ADL and IADL.  OT Frequency: Min 1X/week   Barriers to D/C:            Co-evaluation              AM-PAC OT "6 Clicks" Daily Activity     Outcome Measure Help from another person eating meals?: None Help from another person taking care of personal grooming?: None Help from another person toileting, which includes using toliet, bedpan, or urinal?: A Little Help from another person bathing (including washing, rinsing, drying)?: A Little Help from another person to put on and taking off regular upper body clothing?: None Help from another person to put on and taking off regular lower body clothing?: A Little 6 Click Score: 21   End of Session    Activity Tolerance: Patient tolerated treatment well Patient left: in bed;with call bell/phone within reach;with bed alarm set  OT Visit Diagnosis: Other abnormalities of gait and mobility (R26.89);Muscle weakness (generalized) (M62.81)                Time: 9528-4132 OT Time Calculation (min): 10 min Charges:  OT General Charges $OT Visit: 1 Visit OT Evaluation $OT Eval Moderate Complexity: 1 Mod  Jeni Salles, MPH, MS, OTR/L ascom [phone]  01/29/20, 3:29 PM

## 2020-01-29 NOTE — Evaluation (Signed)
Physical Therapy Evaluation Patient Details Name: [name] MRN: [id number] DOB: 07-12-1950 Today's Date: 01/29/2020   History of Present Illness  [name] is a 69 y.o. male with medical history significant of hypertension, hyperlipidemia, diet-controlled diabetes, stroke, gout, PVD, varicose vein legs, atrial fibrillation, PE/DVT on Eliquis, left renal infarct, tobacco abuse, CHF with EF of 40-45%, who presents with altered mental status, fever, chills, chest pain, diarrhea.  Clinical Impression  Patient received in bed, agrees to PT assessment. Girlfriend present. Patient is mod independent with bed mobility, transfers with supervision/min guard. He ambulated 35 feet with SPC in room and min guard. He reports fatigue and weakness with mobility. He will continue to benefit from skilled PT while here for improved strengthening and functional independence for safe return home.       Follow Up Recommendations Home health PT    Equipment Recommendations  None recommended by PT    Recommendations for Other Services       Precautions / Restrictions Precautions Precautions: Fall Restrictions Weight Bearing Restrictions: No      Mobility  Bed Mobility Overal bed mobility: Modified Independent             General bed mobility comments: HOB elevated, no physical assist  Transfers Overall transfer level: Modified independent Equipment used: Straight cane                Ambulation/Gait Ambulation/Gait assistance: Min guard Gait Distance (Feet): 45 Feet Assistive device: Straight cane Gait Pattern/deviations: Step-through pattern;Decreased stride length Gait velocity: decreased   General Gait Details: patient ambulated with SPC and min guard, slight unsteadiness.  Stairs            Wheelchair Mobility    Modified Rankin (Stroke Patients Only)       Balance Overall balance assessment: Needs assistance Sitting-balance support: Feet  supported Sitting balance-Leahy Scale: Good     Standing balance support: Single extremity supported;During functional activity Standing balance-Leahy Scale: Fair Standing balance comment: slight unsteady, feeling weak with ambulation                             Pertinent Vitals/Pain Pain Assessment: Faces Faces Pain Scale: Hurts a little bit Pain Location: stomach Pain Descriptors / Indicators: Discomfort Pain Intervention(s): Monitored during session    Home Living Family/patient expects to be discharged to:: Private residence Living Arrangements: Alone Available Help at Discharge: Friend(s);Available PRN/intermittently Type of Home: Apartment Home Access: Level entry     Home Layout: One level Home Equipment: Cane - single point;Grab bars - tub/shower      Prior Function Level of Independence: Independent with assistive device(s)         Comments: From previous chart review, Pt reports he is active and independent at baseline. Manages all BADL tasks independenty, drives, and shops. He states his girlfriend assists with cooking and cleaning. Endorses a few near miss falls onto his bed, but no significant falls in the past year.     Hand Dominance   Dominant Hand: Right    Extremity/Trunk Assessment   Upper Extremity Assessment Upper Extremity Assessment: Overall WFL for tasks assessed    Lower Extremity Assessment Lower Extremity Assessment: Generalized weakness    Cervical / Trunk Assessment Cervical / Trunk Assessment: Normal  Communication   Communication: HOH  Cognition Arousal/Alertness: Awake/alert Behavior During Therapy: WFL for tasks assessed/performed Overall Cognitive Status: Within Functional Limits for tasks assessed  General Comments      Exercises     Assessment/Plan    PT Assessment Patient needs continued PT services  PT Problem List Decreased strength;Decreased  mobility;Decreased balance;Decreased knowledge of use of DME;Decreased activity tolerance       PT Treatment Interventions Therapeutic activities;Therapeutic exercise;Gait training;Stair training;Balance training;Functional mobility training;Patient/family education    PT Goals (Current goals can be found in the Care Plan section)  Acute Rehab PT Goals Patient Stated Goal: to return home PT Goal Formulation: With patient Time For Goal Achievement: 02/12/20 Potential to Achieve Goals: Good    Frequency Min 2X/week   Barriers to discharge Decreased caregiver support lives alone, but girlfriend is available to help    Co-evaluation               AM-PAC PT "6 Clicks" Mobility  Outcome Measure Help needed turning from your back to your side while in a flat bed without using bedrails?: A Little Help needed moving from lying on your back to sitting on the side of a flat bed without using bedrails?: A Little Help needed moving to and from a bed to a chair (including a wheelchair)?: A Little Help needed standing up from a chair using your arms (e.g., wheelchair or bedside chair)?: A Little Help needed to walk in hospital room?: A Little Help needed climbing 3-5 steps with a railing? : A Lot 6 Click Score: 17    End of Session Equipment Utilized During Treatment: Gait belt Activity Tolerance: Patient tolerated treatment well Patient left: in bed;with bed alarm set;with call bell/phone within reach Nurse Communication: Mobility status PT Visit Diagnosis: Muscle weakness (generalized) (M62.81);Difficulty in walking, not elsewhere classified (R26.2);Unsteadiness on feet (R26.81)    Time: 2924-4628 PT Time Calculation (min) (ACUTE ONLY): 36 min   Charges:   PT Evaluation $PT Eval Moderate Complexity: 1 Mod PT Treatments $Gait Training: 8-22 mins        Amoria Mclees, PT, GCS 01/29/20,2:16 PM

## 2020-01-30 DIAGNOSIS — G9341 Metabolic encephalopathy: Secondary | ICD-10-CM

## 2020-01-30 LAB — CBC
HCT: 29 % — ABNORMAL LOW (ref 39.0–52.0)
Hemoglobin: 9.8 g/dL — ABNORMAL LOW (ref 13.0–17.0)
MCH: 25.5 pg — ABNORMAL LOW (ref 26.0–34.0)
MCHC: 33.8 g/dL (ref 30.0–36.0)
MCV: 75.3 fL — ABNORMAL LOW (ref 80.0–100.0)
Platelets: 139 10*3/uL — ABNORMAL LOW (ref 150–400)
RBC: 3.85 MIL/uL — ABNORMAL LOW (ref 4.22–5.81)
RDW: 18 % — ABNORMAL HIGH (ref 11.5–15.5)
WBC: 8.3 10*3/uL (ref 4.0–10.5)
nRBC: 0 % (ref 0.0–0.2)

## 2020-01-30 LAB — URINE CULTURE
Culture: >=100000 — AB
Special Requests: NORMAL

## 2020-01-30 LAB — BASIC METABOLIC PANEL
Anion gap: 9 (ref 5–15)
BUN: 20 mg/dL (ref 8–23)
CO2: 25 mmol/L (ref 22–32)
Calcium: 8.3 mg/dL — ABNORMAL LOW (ref 8.9–10.3)
Chloride: 102 mmol/L (ref 98–111)
Creatinine, Ser: 0.92 mg/dL (ref 0.61–1.24)
GFR calc Af Amer: >60 mL/min (ref >60)
GFR calc non Af Amer: >60 mL/min (ref >60)
Glucose, Bld: 113 mg/dL — ABNORMAL HIGH (ref 70–99)
Potassium: 3.5 mmol/L (ref 3.5–5.1)
Sodium: 136 mmol/L (ref 135–145)

## 2020-01-30 MED ORDER — METOPROLOL TARTRATE 25 MG PO TABS
25.0000 mg | ORAL_TABLET | Freq: Two times a day (BID) | ORAL | Status: DC
  Administered 2020-01-30 – 2020-01-31 (×4): 25 mg via ORAL
  Filled 2020-01-30 (×4): qty 1

## 2020-01-30 MED ORDER — POTASSIUM CHLORIDE CRYS ER 20 MEQ PO TBCR
40.0000 meq | EXTENDED_RELEASE_TABLET | Freq: Once | ORAL | Status: AC
  Administered 2020-01-30: 40 meq via ORAL
  Filled 2020-01-30: qty 2

## 2020-01-30 NOTE — Progress Notes (Addendum)
Progress Note    [name]  GGE:[id number] DOB: 26-Jun-1950  DOA: 01/28/2020 PCP: Theotis Burrow, MD      Brief Narrative:    Medical records reviewed and are as summarized below:  [name] is a 69 y.o. male       Assessment/Plan:   Principal Problem:   UTI (urinary tract infection) Active Problems:   Cerebral infarction ([place])   DVT (deep venous thrombosis) ([place])   Essential hypertension   Gout   Pulmonary embolism (HCC)   Chronic a-fib (HCC)   Severe sepsis (HCC)   Tobacco abuse   Elevated troponin   Hypokalemia   Diarrhea   Chest pain   Chronic combined systolic (congestive) and diastolic (congestive) heart failure (HCC)   Acute metabolic encephalopathy Atrial fibrillation with RVR   Body mass index is 27.71 kg/m.    PLAN  Patient received 12.5 mg of metoprolol before his heart rate went into the 140s.  He was given additional metoprolol 25 mg in the morning.  Metoprolol has been increased from 12.5 mg twice daily to 25 mg twice daily.  Monitor heart rate closely on telemetry.  Consider IV medications for rate control if heart rate remains elevated.  Continue IV Rocephin for E. coli sepsis secondary to UTI and bacteremia. Discontinue IV fluids Continue Eliquis for A. fib, history of stroke and PE Replete potassium.  We will try to keep potassium at 4 or more. Magnesium was 2.1 yesterday. Monitor potassium and magnesium levels and replete accordingly.  Diet Order            Diet Heart Room service appropriate? Yes; Fluid consistency: Thin  Diet effective now                      Medications:   . allopurinol  300 mg Oral Daily  . apixaban  5 mg Oral BID  . aspirin EC  81 mg Oral Daily  . atorvastatin  80 mg Oral QPM  . gabapentin  400 mg Oral TID  . influenza vaccine adjuvanted  0.5 mL Intramuscular Tomorrow-1000  . latanoprost  1 drop Both Eyes QHS  . metoprolol tartrate  25 mg Oral BID  . nicotine  7 mg  Transdermal Daily   Continuous Infusions: . cefTRIAXone (ROCEPHIN)  IV 2 g (01/29/20 1651)     Anti-infectives (From admission, onward)   Start     Dose/Rate Route Frequency Ordered Stop   01/28/20 1800  cefTRIAXone (ROCEPHIN) 2 g in sodium chloride 0.9 % 100 mL IVPB        2 g 200 mL/hr over 30 Minutes Intravenous Every 24 hours 01/28/20 1706     01/28/20 0515  cefTRIAXone (ROCEPHIN) 1 g in sodium chloride 0.9 % 100 mL IVPB  Status:  Discontinued        1 g 200 mL/hr over 30 Minutes Intravenous Every 24 hours 01/28/20 0510 01/28/20 1706             Family Communication/Anticipated D/C date and plan/Code Status   DVT prophylaxis:  apixaban (ELIQUIS) tablet 5 mg     Code Status: Full Code  Family Communication: Plan discussed with patient Disposition Plan:    Status is: Inpatient  Remains inpatient appropriate because:Inpatient level of care appropriate due to severity of illness   Dispo: The patient is from: Home              Anticipated d/c is to: Home  Anticipated d/c date is: 1 day              Patient currently is not medically stable to d/c.           Subjective:   Interval events noted.  Telemetry showed that patient went into rapid A. fib with heart rate in the 140s.  He complained of shortness of breath and palpitations.  Objective:    Vitals:   01/30/20 0505 01/30/20 0804 01/30/20 1000 01/30/20 1236  BP: (!) 148/97 (!) 146/95 130/89 115/84  Pulse: (!) 107 91 95 84  Resp: 16   18  Temp: (!) 97.5 F (36.4 C)   98.5 F (36.9 C)  TempSrc: Oral     SpO2: 97% 95% 100% 100%  Weight:      Height:       No data found.   Intake/Output Summary (Last 24 hours) at 01/30/2020 1659 Last data filed at 01/30/2020 0935 Gross per 24 hour  Intake 240 ml  Output 750 ml  Net -510 ml   Filed Weights   01/28/20 0430 01/28/20 2152  Weight: 92.6 kg 97.9 kg    Exam:  GEN: NAD SKIN: No rash EYES: EOMI ENT: MMM CV: Irregular rate  and tachycardic PULM: CTA B ABD: soft, ND, NT, +BS CNS: AAO x 3, non focal EXT: No edema or tenderness   Data Reviewed:   I have personally reviewed following labs and imaging studies:  Labs: Labs show the following:   Basic Metabolic Panel: Recent Labs  Lab 01/28/20 0444 01/28/20 0444 01/29/20 0611 01/30/20 0547  NA 133*  --  135 136  K 3.4*   < > 4.0 3.5  CL 99  --  101 102  CO2 24  --  23 25  GLUCOSE 110*  --  108* 113*  BUN 20  --  25* 20  CREATININE 1.11  --  0.99 0.92  CALCIUM 8.6*  --  8.4* 8.3*  MG  --   --  2.1  --    < > = values in this interval not displayed.   GFR Estimated Creatinine Clearance: 85.6 mL/min (by C-G formula based on SCr of 0.92 mg/dL). Liver Function Tests: Recent Labs  Lab 01/28/20 0444  AST 31  ALT 22  ALKPHOS 123  BILITOT 2.1*  PROT 7.4  ALBUMIN 3.5   No results for input(s): LIPASE, AMYLASE in the last 168 hours. No results for input(s): AMMONIA in the last 168 hours. Coagulation profile Recent Labs  Lab 01/28/20 0444  INR 1.3*    CBC: Recent Labs  Lab 01/28/20 0444 01/29/20 0611 01/30/20 0547  WBC 2.9* 10.9* 8.3  NEUTROABS 2.5  --   --   HGB 11.9* 11.3* 9.8*  HCT 35.6* 32.3* 29.0*  MCV 75.9* 74.1* 75.3*  PLT 133* 118* 139*   Cardiac Enzymes: No results for input(s): CKTOTAL, CKMB, CKMBINDEX, TROPONINI in the last 168 hours. BNP (last 3 results) No results for input(s): PROBNP in the last 8760 hours. CBG: No results for input(s): GLUCAP in the last 168 hours. D-Dimer: No results for input(s): DDIMER in the last 72 hours. Hgb A1c: Recent Labs    01/29/20 0611  HGBA1C 6.3*   Lipid Profile: Recent Labs    01/29/20 0611  CHOL 111  HDL <10*  LDLCALC NOT CALCULATED  TRIG 220*  CHOLHDL NOT CALCULATED   Thyroid function studies: No results for input(s): TSH, T4TOTAL, T3FREE, THYROIDAB in the last 72 hours.  Invalid input(s):  FREET3 Anemia work up: No results for input(s): VITAMINB12, FOLATE,  FERRITIN, TIBC, IRON, RETICCTPCT in the last 72 hours. Sepsis Labs: Recent Labs  Lab 01/28/20 0444 01/28/20 0628 01/28/20 0845 01/29/20 0611 01/30/20 0547  WBC 2.9*  --   --  10.9* 8.3  LATICACIDVEN 2.1* 1.9 1.8  --   --     Microbiology Recent Results (from the past 240 hour(s))  Culture, blood (Routine x 2)     Status: Abnormal (Preliminary result)   Collection Time: 01/28/20  4:44 AM   Specimen: BLOOD  Result Value Ref Range Status   Specimen Description   Final    BLOOD LEFT ANTECUBITAL Performed at [facility], [address] [place], [place] [address]    Special Requests   Final    BOTTLES DRAWN AEROBIC AND ANAEROBIC Blood Culture results may not be optimal due to an excessive volume of blood received in culture bottles Performed at [facility], [address] [place],  [address]    Culture  Setup Time   Final    ANAEROBIC BOTTLE ONLY GRAM NEGATIVE RODS Organism ID to follow CRITICAL RESULT CALLED TO, READ BACK BY AND VERIFIED WITH: MORGAN CUNNINGHAM @1652  01/28/20 MJU    Culture ESCHERICHIA COLI (A)  Final   Report Status PENDING  Incomplete  Blood Culture ID Panel (Reflexed)     Status: Abnormal   Collection Time: 01/28/20  4:44 AM  Result Value Ref Range Status   Enterococcus faecalis NOT DETECTED NOT DETECTED Final   Enterococcus Faecium NOT DETECTED NOT DETECTED Final   Listeria monocytogenes NOT DETECTED NOT DETECTED Final   Staphylococcus species NOT DETECTED NOT DETECTED Final   Staphylococcus aureus (BCID) NOT DETECTED NOT DETECTED Final   Staphylococcus epidermidis NOT DETECTED NOT DETECTED Final   Staphylococcus lugdunensis NOT DETECTED NOT DETECTED Final   Streptococcus species NOT DETECTED NOT DETECTED Final   Streptococcus agalactiae NOT DETECTED NOT DETECTED Final   Streptococcus pneumoniae NOT DETECTED NOT DETECTED Final   Streptococcus pyogenes NOT DETECTED NOT DETECTED Final   A.calcoaceticus-baumannii NOT  DETECTED NOT DETECTED Final   Bacteroides fragilis NOT DETECTED NOT DETECTED Final   Enterobacterales DETECTED (A) NOT DETECTED Final    Comment: Enterobacterales represent a large order of gram negative bacteria, not a single organism. CRITICAL RESULT CALLED TO, READ BACK BY AND VERIFIED WITH: MORGAN CUNNINGHAMM AT 7124 01/28/20 MJU/SDR    Enterobacter cloacae complex NOT DETECTED NOT DETECTED Final   Escherichia coli DETECTED (A) NOT DETECTED Final    Comment: CRITICAL RESULT CALLED TO, READ BACK BY AND VERIFIED WITH: MORGAN CUNNINGHAM @1652  01/28/20 MJU    Klebsiella aerogenes NOT DETECTED NOT DETECTED Final   Klebsiella oxytoca NOT DETECTED NOT DETECTED Final   Klebsiella pneumoniae NOT DETECTED NOT DETECTED Final   Proteus species NOT DETECTED NOT DETECTED Final   Salmonella species NOT DETECTED NOT DETECTED Final   Serratia marcescens NOT DETECTED NOT DETECTED Final   Haemophilus influenzae NOT DETECTED NOT DETECTED Final   Neisseria meningitidis NOT DETECTED NOT DETECTED Final   Pseudomonas aeruginosa NOT DETECTED NOT DETECTED Final   Stenotrophomonas maltophilia NOT DETECTED NOT DETECTED Final   Candida albicans NOT DETECTED NOT DETECTED Final   Candida auris NOT DETECTED NOT DETECTED Final   Candida glabrata NOT DETECTED NOT DETECTED Final   Candida krusei NOT DETECTED NOT DETECTED Final   Candida parapsilosis NOT DETECTED NOT DETECTED Final   Candida tropicalis NOT DETECTED NOT DETECTED Final   Cryptococcus neoformans/gattii NOT  DETECTED NOT DETECTED Final   CTX-M ESBL NOT DETECTED NOT DETECTED Final   Carbapenem resistance IMP NOT DETECTED NOT DETECTED Final   Carbapenem resistance KPC NOT DETECTED NOT DETECTED Final   Carbapenem resistance NDM NOT DETECTED NOT DETECTED Final   Carbapenem resist OXA 48 LIKE NOT DETECTED NOT DETECTED Final   Carbapenem resistance VIM NOT DETECTED NOT DETECTED Final    Comment: Performed at [facility], [address]  [place], [place] [address]  Culture, blood (Routine x 2)     Status: None (Preliminary result)   Collection Time: 01/28/20  5:09 AM   Specimen: BLOOD  Result Value Ref Range Status   Specimen Description BLOOD RIGHT ANTECUBITAL  Final   Special Requests   Final    BOTTLES DRAWN AEROBIC AND ANAEROBIC Blood Culture results may not be optimal due to an excessive volume of blood received in culture bottles   Culture   Final    NO GROWTH 2 DAYS Performed at [facility], [address] [place], [place] [address]    Report Status PENDING  Incomplete  SARS Coronavirus 2 by RT PCR (hospital order, performed in [place] hospital lab) Nasopharyngeal Nasopharyngeal Swab     Status: None   Collection Time: 01/28/20  5:09 AM   Specimen: Nasopharyngeal Swab  Result Value Ref Range Status   SARS Coronavirus 2 NEGATIVE NEGATIVE Final    Comment: (NOTE) SARS-CoV-2 target nucleic acids are NOT DETECTED.  The SARS-CoV-2 RNA is generally detectable in upper and lower respiratory specimens during the acute phase of infection. The lowest concentration of SARS-CoV-2 viral copies this assay can detect is 250 copies / mL. A negative result does not preclude SARS-CoV-2 infection and should not be used as the sole basis for treatment or other patient management decisions.  A negative result may occur with improper specimen collection / handling, submission of specimen other than nasopharyngeal swab, presence of viral mutation(s) within the areas targeted by this assay, and inadequate number of viral copies (<250 copies / mL). A negative result must be combined with clinical observations, patient history, and epidemiological information.  Fact Sheet for Patients:   StrictlyIdeas.no  Fact Sheet for Healthcare Providers: BankingDealers.co.za  This test is not yet approved or  cleared by the [place] FDA and has been authorized for detection and/or  diagnosis of SARS-CoV-2 by FDA under an Emergency Use Authorization (EUA).  This EUA will remain in effect (meaning this test can be used) for the duration of the COVID-19 declaration under Section 564(b)(1) of the Act, 21 U.S.C. section 360bbb-3(b)(1), unless the authorization is terminated or revoked sooner.  Performed at [facility], [address], [place], [place] [address]   Urine Culture     Status: Abnormal   Collection Time: 01/28/20  5:09 AM   Specimen: Urine, Random  Result Value Ref Range Status   Specimen Description   Final    URINE, RANDOM Performed at [facility], [address] [place], [place] [address]    Special Requests   Final    Normal Performed at [facility], [address] [place], [place] [address]    Culture >=100,000 COLONIES/mL ESCHERICHIA COLI (A)  Final   Report Status 01/30/2020 FINAL  Final   Organism ID, Bacteria ESCHERICHIA COLI (A)  Final      Susceptibility   Escherichia coli - MIC*    AMPICILLIN <=2 SENSITIVE Sensitive     CEFAZOLIN <=4 SENSITIVE Sensitive  CEFTRIAXONE <=0.25 SENSITIVE Sensitive     CIPROFLOXACIN <=0.25 SENSITIVE Sensitive     GENTAMICIN <=1 SENSITIVE Sensitive     IMIPENEM <=0.25 SENSITIVE Sensitive     NITROFURANTOIN <=16 SENSITIVE Sensitive     TRIMETH/SULFA <=20 SENSITIVE Sensitive     AMPICILLIN/SULBACTAM <=2 SENSITIVE Sensitive     PIP/TAZO <=4 SENSITIVE Sensitive     * >=100,000 COLONIES/mL ESCHERICHIA COLI  C Difficile Quick Screen w PCR reflex     Status: Abnormal   Collection Time: 01/28/20  6:31 PM   Specimen: STOOL  Result Value Ref Range Status   C Diff antigen POSITIVE (A) NEGATIVE Final   C Diff toxin NEGATIVE NEGATIVE Final   C Diff interpretation Results are indeterminate. See PCR results.  Final    Comment: Performed at [facility], [address] [place], [place] [address]  Gastrointestinal Panel by PCR , Stool     Status: None   Collection Time:  01/28/20  6:31 PM   Specimen: STOOL  Result Value Ref Range Status   Campylobacter species NOT DETECTED NOT DETECTED Final   Plesimonas shigelloides NOT DETECTED NOT DETECTED Final   Salmonella species NOT DETECTED NOT DETECTED Final   Yersinia enterocolitica NOT DETECTED NOT DETECTED Final   Vibrio species NOT DETECTED NOT DETECTED Final   Vibrio cholerae NOT DETECTED NOT DETECTED Final   Enteroaggregative E coli (EAEC) NOT DETECTED NOT DETECTED Final   Enteropathogenic E coli (EPEC) NOT DETECTED NOT DETECTED Final   Enterotoxigenic E coli (ETEC) NOT DETECTED NOT DETECTED Final   Shiga like toxin producing E coli (STEC) NOT DETECTED NOT DETECTED Final   Shigella/Enteroinvasive E coli (EIEC) NOT DETECTED NOT DETECTED Final   Cryptosporidium NOT DETECTED NOT DETECTED Final   Cyclospora cayetanensis NOT DETECTED NOT DETECTED Final   Entamoeba histolytica NOT DETECTED NOT DETECTED Final   Giardia lamblia NOT DETECTED NOT DETECTED Final   Adenovirus F40/41 NOT DETECTED NOT DETECTED Final   Astrovirus NOT DETECTED NOT DETECTED Final   Norovirus GI/GII NOT DETECTED NOT DETECTED Final   Rotavirus A NOT DETECTED NOT DETECTED Final   Sapovirus (I, II, IV, and V) NOT DETECTED NOT DETECTED Final    Comment: Performed at [facility], [address] [place], [place] [address]  C. Diff by PCR, Reflexed     Status: None   Collection Time: 01/28/20  6:31 PM  Result Value Ref Range Status   Toxigenic C. Difficile by PCR NEGATIVE NEGATIVE Final    Comment: Patient is colonized with non toxigenic C. difficile. May not need treatment unless significant symptoms are present. Performed at [facility], [address] [place], [place] [address]     Procedures and diagnostic studies:  No results found.             LOS: 2 days   Bowie Delia  [facility]   Pager on [email or web address]. If 7PM-7AM, please contact night-coverage at [email or web address]     01/30/2020,  4:59 PM

## 2020-01-30 NOTE — Care Plan (Addendum)
Patient having episodes of afib with rvr just now at 944am. Patient had his metoprolol PO this am. Notified by tele. MD Ayiku notified. Awaiting orders.  Update: Pt ordered an addition 25mg  Metoprolol at this time. Given PO. Pt stated he was SOB at this time he could feel his heart beating "alittle faster." He states this happens at home from time to time. MD made aware. Pt placed on 2 L oxygen, 100% at this time.

## 2020-01-30 NOTE — TOC Progression Note (Signed)
Transition of Care [facility]) - Progression Note    Patient Details  Name: [name] MRN: [id number] Date of Birth: Oct 21, 1950  Transition of Care [facility]) CM/SW Contact  Beverly Sessions, RN Phone Number: 01/30/2020, 10:31 AM  Clinical Narrative:    [facility] unable to accept patient with OT need at this time.   Referral accepted by [place] with [facility] for RN, PT, and OT   Expected Discharge Plan: [place] Barriers to Discharge: Continued Medical Work up  Expected Discharge Plan and Services Expected Discharge Plan: [place] arrangements for the past 2 months: Apartment                                       Social Determinants of Health (SDOH) Interventions    Readmission Risk Interventions Readmission Risk Prevention Plan 01/29/2020  Transportation Screening Complete  Palliative Care Screening Not Applicable  Medication Review (RN Care Manager) Complete  Some recent data might be hidden

## 2020-01-31 LAB — POTASSIUM: Potassium: 3.9 mmol/L (ref 3.5–5.1)

## 2020-01-31 LAB — MAGNESIUM: Magnesium: 1.9 mg/dL (ref 1.7–2.4)

## 2020-01-31 LAB — CULTURE, BLOOD (ROUTINE X 2)

## 2020-01-31 MED ORDER — LEVOFLOXACIN 500 MG PO TABS
500.0000 mg | ORAL_TABLET | Freq: Every day | ORAL | Status: AC
  Administered 2020-01-31 – 2020-02-03 (×4): 500 mg via ORAL
  Filled 2020-01-31 (×4): qty 1

## 2020-01-31 MED ORDER — MAGNESIUM SULFATE 2 GM/50ML IV SOLN
2.0000 g | Freq: Once | INTRAVENOUS | Status: AC
  Administered 2020-01-31: 2 g via INTRAVENOUS
  Filled 2020-01-31: qty 50

## 2020-01-31 MED ORDER — POTASSIUM CHLORIDE CRYS ER 20 MEQ PO TBCR
40.0000 meq | EXTENDED_RELEASE_TABLET | Freq: Once | ORAL | Status: AC
  Administered 2020-01-31: 40 meq via ORAL
  Filled 2020-01-31: qty 2

## 2020-01-31 MED ORDER — DILTIAZEM HCL 60 MG PO TABS
60.0000 mg | ORAL_TABLET | Freq: Three times a day (TID) | ORAL | Status: DC
  Administered 2020-01-31 – 2020-02-02 (×5): 60 mg via ORAL
  Filled 2020-01-31 (×8): qty 1

## 2020-01-31 NOTE — TOC Progression Note (Signed)
Transition of Care [facility]) - Progression Note    Patient Details  Name: Ziere Docken MRN: [id number] Date of Birth: Jun 11, 1950  [facility]) CM/SW Contact  Beverly Sessions, RN Phone Number: 01/31/2020, 3:12 PM  Clinical Narrative:     Patient did poorly with PT today New onset confusion  PT is no recommending SNF.  Long term girlfriend in agreement.  Existing PASRR fl2 sent for signature Bed search initiated   Expected Discharge Plan: [place] Barriers to Discharge: Continued Medical Work up  Expected Discharge Plan and Services Expected Discharge Plan: [place] arrangements for the past 2 months: Apartment                                       Social Determinants of Health (SDOH) Interventions    Readmission Risk Interventions Readmission Risk Prevention Plan 01/29/2020  Transportation Screening Complete  Palliative Care Screening Not Applicable  Medication Review (RN Care Manager) Complete  Some recent data might be hidden

## 2020-01-31 NOTE — Progress Notes (Signed)
Bladder scanned patient and there was 682mL in bladder. Encouraged patient to urinate and he put out 250. He also urinated in bed with about 2 occurrences.  Will continue to monitor.  Christene Slates

## 2020-01-31 NOTE — Progress Notes (Signed)
Occupational Therapy Treatment Patient Details Name: [name] MRN: [id number] DOB: [birth year] Today's Date: 01/31/2020    History of present illness [name] is a 69 y.o. male with medical history significant of hypertension, hyperlipidemia, diet-controlled diabetes, stroke, gout, PVD, varicose vein legs, atrial fibrillation, PE/DVT on Eliquis, left renal infarct, tobacco abuse, CHF with EF of 40-45%, who presents with altered mental status, fever, chills, chest pain, diarrhea.   OT comments  Upon entering the room, pt supine in bed with no c/o pain and agreeable to OT intervention. Pt requesting to "wash up" this session. Pt standing from bed, at standard height, with min A and use of SPC x 5 reps and displaying controlled descend. Pt standing at sink for grooming tasks with mod multimodal cuing for sequencing, initiation, and attention to task. Pt with increasing tangential speech and confusion this session. Pt asking therapist about "the house above [place]" and therapist orienting pt to situation, location, and time. Pt standing and performing bathing tasks with sit <>stand at sink and min A overall. Pt returning to bed at end of session secondary to fatigue. All needs within reach. OT now recommending SNF at discharge secondary to decline in cognition and safety. Pt does not have 24/7 assist at discharge.    Follow Up Recommendations  SNF    Equipment Recommendations  Other (comment) (defer to next venue of care)       Precautions / Restrictions Precautions Precautions: Fall       Mobility Bed Mobility Overal bed mobility: Needs Assistance Bed Mobility: Supine to Sit;Sit to Supine     Supine to sit: HOB elevated;Min guard Sit to supine: Min guard   General bed mobility comments: min cuing for technique  Transfers Overall transfer level: Needs assistance Equipment used: Straight cane   Sit to Stand: Min assist         Balance Overall balance assessment:  Needs assistance Sitting-balance support: Feet supported;No upper extremity supported Sitting balance-Leahy Scale: Good Sitting balance - Comments: no LOB in sitting    Standing balance support: Single extremity supported;During functional activity Standing balance-Leahy Scale: Fair Standing balance comment: min A for balance        ADL either performed or assessed with clinical judgement   ADL Overall ADL's : Needs assistance/impaired     Grooming: Wash/dry hands;Wash/dry face;Oral care;Standing;Minimal assistance;Cueing for safety;Cueing for sequencing   Upper Body Bathing: Standing;Minimal assistance;Cueing for sequencing;Cueing for safety   Lower Body Bathing: Minimal assistance;Sit to/from stand   Upper Body Dressing : Set up Upper Body Dressing Details (indicate cue type and reason): hospital gown Lower Body Dressing: Minimal assistance Lower Body Dressing Details (indicate cue type and reason): B socks             Functional mobility during ADLs: Min guard General ADL Comments: Supervision to [place] for ADL Transfers for safety     Vision Patient Visual Report: No change from baseline            Cognition Arousal/Alertness: Awake/alert Behavior During Therapy: Impulsive Overall Cognitive Status: Impaired/Different from baseline Area of Impairment: Safety/judgement;Attention;Following commands;Memory;Problem solving;Awareness    Current Attention Level: Selective Memory: Decreased recall of precautions;Decreased short-term memory Following Commands: Follows one step commands consistently Safety/Judgement: Decreased awareness of safety;Decreased awareness of deficits Awareness: Intellectual Problem Solving: Slow processing;Requires tactile cues;Requires verbal cues;Difficulty sequencing;Decreased initiation General Comments: Pt does not remember girlfriend being present earlier today. He asks, " Who lives in that house above [place]?" and seems surprised when I tell  him we are in the hospital.                   Pertinent Vitals/ Pain       Pain Assessment: No/denies pain         Frequency  Min 1X/week        Progress Toward Goals  OT Goals(current goals can now be found in the care plan section)  Progress towards OT goals: Progressing toward goals  Acute Rehab OT Goals Patient Stated Goal: none stated OT Goal Formulation: With patient Time For Goal Achievement: 02/12/20 Potential to Achieve Goals: Good  Plan Discharge plan needs to be updated       AM-PAC OT "6 Clicks" Daily Activity     Outcome Measure   Help from another person eating meals?: None Help from another person taking care of personal grooming?: None Help from another person toileting, which includes using toliet, bedpan, or urinal?: A Little Help from another person bathing (including washing, rinsing, drying)?: A Little Help from another person to put on and taking off regular upper body clothing?: None Help from another person to put on and taking off regular lower body clothing?: A Little 6 Click Score: 21    End of Session Equipment Utilized During Treatment: Other (comment) (SPC)  OT Visit Diagnosis: Other abnormalities of gait and mobility (R26.89);Muscle weakness (generalized) (M62.81)   Activity Tolerance Patient tolerated treatment well   Patient Left in bed;with call bell/phone within reach;with bed alarm set           Time: 3545-6256 OT Time Calculation (min): 57 min  Charges: OT General Charges $OT Visit: 1 Visit OT Treatments $Self Care/Home Management : 53-67 mins  Darleen Crocker, MS, OTR/L , CBIS ascom [phone]  01/31/20, 4:39 PM

## 2020-01-31 NOTE — NC FL2 (Signed)
[place] LEVEL OF CARE SCREENING TOOL     IDENTIFICATION  Patient Name: [name] Birthdate: 14-Nov-1950 Sex: male Admission Date (Current Location): 01/28/2020  [facility] and [place] Number:  Engineering geologist and Address:         Provider Number: [id number]  Attending Physician Name and Address:  Jennye Boroughs, MD  Relative Name and Phone Number:       Current Level of Care: Hospital Recommended Level of Care: [place] Prior Approval Number:    Date Approved/Denied:   PASRR Number: [id number]  Discharge Plan: SNF    Current Diagnoses: Patient Active Problem List   Diagnosis Date Noted   Elevated troponin 01/28/2020   UTI (urinary tract infection) 01/28/2020   Hypokalemia 01/28/2020   Diarrhea 01/28/2020   Chest pain 01/28/2020   Chronic combined systolic (congestive) and diastolic (congestive) heart failure ([place]) 42/70/6237   Acute metabolic encephalopathy 62/83/1517   Coagulation disorder ([place]) 11/29/2019   AKI (acute kidney injury) ([place])    History of cocaine abuse ([place])    Acute ischemic right PCA stroke ([place]) 09/13/2019   Vision loss, bilateral    Tobacco abuse    Bradycardia    Non-sustained ventricular tachycardia (H[facility])    Dizziness 09/12/2019   Nonintractable headache    Slurred speech    Type 2 diabetes mellitus with hyperlipidemia (HCC)    PAD (peripheral artery disease) ([place]) 05/20/2019   Chronic a-fib ([place]) 04/08/2019   Fever 04/08/2019   Severe sepsis ([place]) 04/08/2019   Abdominal pain 04/07/2019   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[place]) 04/07/2019   Pain due to onychomycosis of toenails of both feet 11/20/2018   Diabetic neuropathy ([place]) 11/20/2018   Influenza A 06/22/2018   Community acquired pneumonia 06/22/2018   Arthritis 05/12/2017   Varicose veins of leg with swelling, bilateral 01/18/2017   Facial twitching    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) 04/26/2011   Type 2 diabetes mellitus without complication ([place]) 61/60/7371   Accelerated hypertension 04/26/2011   Anemia 04/26/2011   DVT (deep venous thrombosis) ([place]) 04/26/2011   Essential hypertension 04/26/2011    Orientation RESPIRATION BLADDER Height & Weight     Self  Normal Incontinent, External catheter Weight: 97.9 kg Height:  6\' 2"  (188 cm)  BEHAVIORAL SYMPTOMS/MOOD NEUROLOGICAL BOWEL NUTRITION STATUS      Continent Diet (Heart Healthy)  AMBULATORY STATUS COMMUNICATION OF NEEDS Skin   Extensive Assist Verbally Normal                       Personal Care Assistance Level of Assistance              Functional Limitations Info             SPECIAL CARE FACTORS FREQUENCY  PT (By licensed PT), OT (By licensed OT)                    Contractures Contractures Info: Not present    Additional Factors Info  Code Status, Allergies Code Status Info: Full Allergies Info: NKDA  Current Medications (01/31/2020):  This is the current hospital active medication list Current Facility-Administered Medications  Medication Dose Route Frequency Provider Last Rate Last Admin   acetaminophen (TYLENOL) tablet 650 mg  650 mg Oral Q6H PRN Ivor Costa, MD   650 mg at 01/29/20 0818   Or   acetaminophen (TYLENOL) suppository 650 mg  650 mg Rectal Q6H PRN Ivor Costa, MD       allopurinol (ZYLOPRIM) tablet 300 mg  300 mg Oral Daily Ivor Costa, MD   300 mg at 01/31/20 4259   apixaban (ELIQUIS) tablet 5 mg  5 mg Oral BID Ivor Costa, MD   5 mg at 01/31/20 5638   aspirin EC tablet 81 mg  81 mg Oral Daily Ivor Costa, MD   81 mg at 01/31/20 7564   atorvastatin (LIPITOR) tablet 80 mg  80 mg Oral QPM Ivor Costa, MD   80 mg at 01/30/20 1612   colchicine tablet 0.6 mg  0.6 mg Oral Daily PRN Ivor Costa, MD       cyclobenzaprine (FLEXERIL) tablet 5 mg  5 mg Oral TID PRN Ivor Costa, MD       diclofenac Sodium (VOLTAREN) 1 % topical gel 2 g  2 g Topical QID PRN Ivor Costa, MD       diltiazem (CARDIZEM) tablet 60 mg  60 mg Oral Q8H Jennye Boroughs, MD   60 mg at 01/31/20 1130   gabapentin (NEURONTIN) capsule 400 mg  400 mg Oral TID Ivor Costa, MD   400 mg at 01/31/20 3329   influenza vaccine adjuvanted (FLUAD) injection 0.5 mL  0.5 mL Intramuscular Tomorrow-1000 Ivor Costa, MD       latanoprost (XALATAN) 0.005 % ophthalmic solution 1 drop  1 drop Both Eyes QHS Ivor Costa, MD   1 drop at 01/30/20 2057   levofloxacin (LEVAQUIN) tablet 500 mg  500 mg Oral Daily Jennye Boroughs, MD   500 mg at 01/31/20 1130   magnesium sulfate IVPB 2 g 50 mL  2 g Intravenous Once Jennye Boroughs, MD       metoprolol tartrate (LOPRESSOR) tablet 25 mg  25 mg Oral BID Jennye Boroughs, MD   25 mg at 01/31/20 5188   nicotine (NICODERM CQ - dosed in mg/24 hr) patch 7 mg  7 mg Transdermal Daily Ivor Costa, MD   7 mg at 01/31/20 0926   ondansetron (ZOFRAN) injection 4 mg  4 mg Intravenous Q8H PRN Ivor Costa, MD       potassium chloride SA (KLOR-CON) CR tablet 40 mEq  40 mEq Oral Once Jennye Boroughs, MD         Discharge Medications: Please see discharge summary for a list of discharge medications.  Relevant Imaging Results:  Relevant Lab Results:   Additional Information ss [id number]  Beverly Sessions, RN

## 2020-01-31 NOTE — Plan of Care (Signed)
Patient became intermittently confused during the night, trying to get out of bed. He was redirected and put back in bed. Will continue to monitor.  Christene Slates

## 2020-01-31 NOTE — Progress Notes (Signed)
Physical Therapy Treatment Patient Details Name: [name] MRN: [id number] DOB: December 26, 1950 Today's Date: 01/31/2020    History of Present Illness [name] is a 69 y.o. male with medical history significant of hypertension, hyperlipidemia, diet-controlled diabetes, stroke, gout, PVD, varicose vein legs, atrial fibrillation, PE/DVT on Eliquis, left renal infarct, tobacco abuse, CHF with EF of 40-45%, who presents with altered mental status, fever, chills, chest pain, diarrhea.    PT Comments    Pt was long sitting in bed, awake, with girlfriend of over 20 + years in room. He is confused. Was able to answer questions of orientation correctly however does have hallucinations during session. Much less safe this session than previously observed a few days prior. He was able to sit up on L side of EOB with CGA for safety. Pt has great strength but poor insight of deficits and poor safety awareness. Pt used SPC during evaluation a few days ago, and was steady on his feet. Today, pt requires max assist to prevent fall with only standing and taking ~ 8 steps. RN aware. Highly recommend use of RW at all times pt is on his feet. Recommend +2 for safety due to impulsivity and pt's size. He is a high fall risk.  Acute PT changing recommendation to SNF at [place] because he does not have adequate assistance at home and has had major change in cognition.     Follow Up Recommendations  SNF;Supervision/Assistance - 24 hour;Supervision - Intermittent;Supervision for mobility/OOB     Equipment Recommendations  Rolling walker with 5" wheels    Recommendations for Other Services       Precautions / Restrictions Precautions Precautions: Fall Precaution Comments: high fall risk Restrictions Weight Bearing Restrictions: No    Mobility  Bed Mobility Overal bed mobility: Needs Assistance Bed Mobility: Supine to Sit;Sit to Supine     Supine to sit: HOB elevated;Min guard Sit to supine: Min  guard   General bed mobility comments: Requires CGA for safety and sequencing. Alot of vcs for technique improvements. pt's cognition slows progress  Transfers Overall transfer level: Needs assistance Equipment used: Straight cane Transfers: Sit to/from Stand Sit to Stand: Min assist;From elevated surface         General transfer comment: pt required min assist 2/2 to impulsivity and balance. SPC in room but pt unsafe to use. recommend use of RW for improved safety  Ambulation/Gait Ambulation/Gait assistance: Max assist Gait Distance (Feet): 8 Feet Assistive device: Straight cane Gait Pattern/deviations: Staggering left;Staggering right;Drifts right/left;WFL(Within Functional Limits) Gait velocity: decreased   General Gait Details: pt extremely unsteady with use of SPC. within ambulation only 8 ft pt required max assist to prevent fall. he is unsafe to use SPC at this time.         Balance Overall balance assessment: Needs assistance Sitting-balance support: Feet supported;No upper extremity supported Sitting balance-Leahy Scale: Good Sitting balance - Comments: no LOB in sitting    Standing balance support: Single extremity supported;During functional activity Standing balance-Leahy Scale: Poor Standing balance comment: very unsteady this date with use of SPC.         Cognition Arousal/Alertness: Awake/alert Behavior During Therapy: Impulsive Overall Cognitive Status: Impaired/Different from baseline Area of Impairment: Safety/judgement;Attention;Following commands;Memory;Problem solving;Awareness      Current Attention Level: Selective Memory: Decreased recall of precautions;Decreased short-term memory Following Commands: Follows one step commands consistently Safety/Judgement: Decreased awareness of safety;Decreased awareness of deficits Awareness: Intellectual Problem Solving: Slow processing;Requires tactile cues;Requires verbal cues;Difficulty  sequencing;Decreased initiation General Comments:  Per pt's "girlfriend/ significant other" pt's cognition is way off from baseline. he has poor insight of his deficits and was unable to follow commands consistently.             Pertinent Vitals/Pain Pain Assessment: No/denies pain Faces Pain Scale: No hurt           PT Goals (current goals can now be found in the care plan section) Acute Rehab PT Goals Patient Stated Goal: none stated Progress towards PT goals: Progressing toward goals    Frequency    Min 2X/week      PT Plan Discharge plan needs to be updated       AM-PAC PT "6 Clicks" Mobility   Outcome Measure  Help needed turning from your back to your side while in a flat bed without using bedrails?: A Little Help needed moving from lying on your back to sitting on the side of a flat bed without using bedrails?: A Little Help needed moving to and from a bed to a chair (including a wheelchair)?: A Lot Help needed standing up from a chair using your arms (e.g., wheelchair or bedside chair)?: A Lot Help needed to walk in hospital room?: A Lot Help needed climbing 3-5 steps with a railing? : A Lot 6 Click Score: 14    End of Session Equipment Utilized During Treatment: Gait belt Activity Tolerance: Patient tolerated treatment well Patient left: in bed;with bed alarm set;with call bell/phone within reach Nurse Communication: Mobility status PT Visit Diagnosis: Muscle weakness (generalized) (M62.81);Difficulty in walking, not elsewhere classified (R26.2);Unsteadiness on feet (R26.81)     Time: 5885-0277 PT Time Calculation (min) (ACUTE ONLY): 24 min  Charges:  $Gait Training: 8-22 mins $Therapeutic Activity: 8-22 mins                     Julaine Fusi PTA 01/31/20, 1:05 PM

## 2020-01-31 NOTE — Progress Notes (Addendum)
Progress Note    [name]  ZYY:[id number] DOB: 1950/06/24  DOA: 01/28/2020 PCP: Theotis Burrow, MD      Brief Narrative:    Medical records reviewed and are as summarized below:  [name] is a 69 y.o. male       Assessment/Plan:   Principal Problem:   UTI (urinary tract infection) Active Problems:   Cerebral infarction ([facility])   DVT (deep venous thrombosis) ([place])   Essential hypertension   Gout   Pulmonary embolism (HCC)   Chronic a-fib (HCC)   Severe sepsis (HCC)   Tobacco abuse   Elevated troponin   Hypokalemia   Diarrhea   Chest pain   Chronic combined systolic (congestive) and diastolic (congestive) heart failure (HCC)   Acute metabolic encephalopathy Atrial fibrillation with RVR Hallucination/confusion/delirium  Body mass index is 27.71 kg/m.    PLAN  Start oral Cardizem for adequate rate control of A. fib.  Monitor heart rate for response. Continue metoprolol Discontinue IV Rocephin and start oral Levaquin for E. coli sepsis, UTI and bacteremia Continue Eliquis for A. fib, history of stroke and PE Replete potassium and magnesium to keep levels above 4 and 2 respectively. He did not do well with PT today so PT has recommended discharge to SNF.  Unfortunately, patient lives alone and he is a high fall risk.  He said he has a friend who comes to cook for him from time to time.  Follow-up with case manager to assist with disposition. He will be ready for discharge when heart rate and confusion improves.   Diet Order            Diet Heart Room service appropriate? Yes; Fluid consistency: Thin  Diet effective now                      Medications:   . allopurinol  300 mg Oral Daily  . apixaban  5 mg Oral BID  . aspirin EC  81 mg Oral Daily  . atorvastatin  80 mg Oral QPM  . diltiazem  60 mg Oral Q8H  . gabapentin  400 mg Oral TID  . influenza vaccine adjuvanted  0.5 mL Intramuscular Tomorrow-1000  .  latanoprost  1 drop Both Eyes QHS  . levofloxacin  500 mg Oral Daily  . metoprolol tartrate  25 mg Oral BID  . nicotine  7 mg Transdermal Daily   Continuous Infusions:    Anti-infectives (From admission, onward)   Start     Dose/Rate Route Frequency Ordered Stop   01/31/20 1015  levofloxacin (LEVAQUIN) tablet 500 mg        500 mg Oral Daily 01/31/20 1012 02/04/20 0959   01/28/20 1800  cefTRIAXone (ROCEPHIN) 2 g in sodium chloride 0.9 % 100 mL IVPB  Status:  Discontinued        2 g 200 mL/hr over 30 Minutes Intravenous Every 24 hours 01/28/20 1706 01/31/20 1012   01/28/20 0515  cefTRIAXone (ROCEPHIN) 1 g in sodium chloride 0.9 % 100 mL IVPB  Status:  Discontinued        1 g 200 mL/hr over 30 Minutes Intravenous Every 24 hours 01/28/20 0510 01/28/20 1706             Family Communication/Anticipated D/C date and plan/Code Status   DVT prophylaxis:  apixaban (ELIQUIS) tablet 5 mg     Code Status: Full Code  Family Communication: Plan discussed with patient Disposition Plan:  Status is: Inpatient  Remains inpatient appropriate because:Inpatient level of care appropriate due to severity of illness   Dispo: The patient is from: Home              Anticipated d/c is to: Home              Anticipated d/c date is: 1 day              Patient currently is not medically stable to d/c.           Subjective:   Interval events noted.  Patient has had some confusion overnight.  He's also had visual hallucinations this morning.  He has been tachycardic on telemetry with heart rate between 100 and 125  Objective:    Vitals:   01/30/20 1000 01/30/20 1236 01/30/20 2002 01/31/20 0612  BP: 130/89 115/84 135/88 (!) 169/96  Pulse: 95 84 93 97  Resp:  18 19   Temp:  98.5 F (36.9 C) 97.6 F (36.4 C) 98.1 F (36.7 C)  TempSrc:   Oral   SpO2: 100% 100% 100% 100%  Weight:      Height:       No data found.   Intake/Output Summary (Last 24 hours) at 01/31/2020  1012 Last data filed at 01/31/2020 0263 Gross per 24 hour  Intake 120 ml  Output 250 ml  Net -130 ml   Filed Weights   01/28/20 0430 01/28/20 2152  Weight: 92.6 kg 97.9 kg    Exam:  GEN: NAD SKIN: Warm and dry EYES: No pallor or icterus ENT: MMM CV: Irregular rate and rhythm, tachycardic PULM: CTA B ABD: soft, ND, NT, +BS CNS: AAO x 1 (person), slurred speech at baseline, no focal deficits EXT: No edema or tenderness    Data Reviewed:   I have personally reviewed following labs and imaging studies:  Labs: Labs show the following:   Basic Metabolic Panel: Recent Labs  Lab 01/28/20 0444 01/28/20 0444 01/29/20 0611 01/30/20 0547  NA 133*  --  135 136  K 3.4*   < > 4.0 3.5  CL 99  --  101 102  CO2 24  --  23 25  GLUCOSE 110*  --  108* 113*  BUN 20  --  25* 20  CREATININE 1.11  --  0.99 0.92  CALCIUM 8.6*  --  8.4* 8.3*  MG  --   --  2.1  --    < > = values in this interval not displayed.   GFR Estimated Creatinine Clearance: 85.6 mL/min (by C-G formula based on SCr of 0.92 mg/dL). Liver Function Tests: Recent Labs  Lab 01/28/20 0444  AST 31  ALT 22  ALKPHOS 123  BILITOT 2.1*  PROT 7.4  ALBUMIN 3.5   No results for input(s): LIPASE, AMYLASE in the last 168 hours. No results for input(s): AMMONIA in the last 168 hours. Coagulation profile Recent Labs  Lab 01/28/20 0444  INR 1.3*    CBC: Recent Labs  Lab 01/28/20 0444 01/29/20 0611 01/30/20 0547  WBC 2.9* 10.9* 8.3  NEUTROABS 2.5  --   --   HGB 11.9* 11.3* 9.8*  HCT 35.6* 32.3* 29.0*  MCV 75.9* 74.1* 75.3*  PLT 133* 118* 139*   Cardiac Enzymes: No results for input(s): CKTOTAL, CKMB, CKMBINDEX, TROPONINI in the last 168 hours. BNP (last 3 results) No results for input(s): PROBNP in the last 8760 hours. CBG: No results for input(s): GLUCAP in the last 168 hours.  D-Dimer: No results for input(s): DDIMER in the last 72 hours. Hgb A1c: Recent Labs    01/29/20 0611  HGBA1C 6.3*    Lipid Profile: Recent Labs    01/29/20 0611  CHOL 111  HDL <10*  LDLCALC NOT CALCULATED  TRIG 220*  CHOLHDL NOT CALCULATED   Thyroid function studies: No results for input(s): TSH, T4TOTAL, T3FREE, THYROIDAB in the last 72 hours.  Invalid input(s): FREET3 Anemia work up: No results for input(s): VITAMINB12, FOLATE, FERRITIN, TIBC, IRON, RETICCTPCT in the last 72 hours. Sepsis Labs: Recent Labs  Lab 01/28/20 0444 01/28/20 0628 01/28/20 0845 01/29/20 0611 01/30/20 0547  WBC 2.9*  --   --  10.9* 8.3  LATICACIDVEN 2.1* 1.9 1.8  --   --     Microbiology Recent Results (from the past 240 hour(s))  Culture, blood (Routine x 2)     Status: Abnormal   Collection Time: 01/28/20  4:44 AM   Specimen: BLOOD  Result Value Ref Range Status   Specimen Description   Final    BLOOD LEFT ANTECUBITAL Performed at [facility], [address] [place], [place] [address]    Special Requests   Final    BOTTLES DRAWN AEROBIC AND ANAEROBIC Blood Culture results may not be optimal due to an excessive volume of blood received in culture bottles Performed at [facility], [address] [place], [place] [address]    Culture  Setup Time   Final    ANAEROBIC BOTTLE ONLY GRAM NEGATIVE RODS Organism ID to follow CRITICAL RESULT CALLED TO, READ BACK BY AND VERIFIED WITH: MORGAN CUNNINGHAM @1652  01/28/20 MJU    Culture ESCHERICHIA COLI (A)  Final   Report Status 01/31/2020 FINAL  Final   Organism ID, Bacteria ESCHERICHIA COLI  Final      Susceptibility   Escherichia coli - MIC*    AMPICILLIN <=2 SENSITIVE Sensitive     CEFAZOLIN <=4 SENSITIVE Sensitive     CEFEPIME <=0.12 SENSITIVE Sensitive     CEFTAZIDIME <=1 SENSITIVE Sensitive     CEFTRIAXONE <=0.25 SENSITIVE Sensitive     CIPROFLOXACIN <=0.25 SENSITIVE Sensitive     GENTAMICIN <=1 SENSITIVE Sensitive     IMIPENEM <=0.25 SENSITIVE Sensitive     TRIMETH/SULFA <=20 SENSITIVE Sensitive     AMPICILLIN/SULBACTAM  <=2 SENSITIVE Sensitive     PIP/TAZO <=4 SENSITIVE Sensitive     * ESCHERICHIA COLI  Blood Culture ID Panel (Reflexed)     Status: Abnormal   Collection Time: 01/28/20  4:44 AM  Result Value Ref Range Status   Enterococcus faecalis NOT DETECTED NOT DETECTED Final   Enterococcus Faecium NOT DETECTED NOT DETECTED Final   Listeria monocytogenes NOT DETECTED NOT DETECTED Final   Staphylococcus species NOT DETECTED NOT DETECTED Final   Staphylococcus aureus (BCID) NOT DETECTED NOT DETECTED Final   Staphylococcus epidermidis NOT DETECTED NOT DETECTED Final   Staphylococcus lugdunensis NOT DETECTED NOT DETECTED Final   Streptococcus species NOT DETECTED NOT DETECTED Final   Streptococcus agalactiae NOT DETECTED NOT DETECTED Final   Streptococcus pneumoniae NOT DETECTED NOT DETECTED Final   Streptococcus pyogenes NOT DETECTED NOT DETECTED Final   A.calcoaceticus-baumannii NOT DETECTED NOT DETECTED Final   Bacteroides fragilis NOT DETECTED NOT DETECTED Final   Enterobacterales DETECTED (A) NOT DETECTED Final    Comment: Enterobacterales represent a large order of gram negative bacteria, not a single organism. CRITICAL RESULT CALLED TO, READ BACK BY AND VERIFIED WITH: [facility] AT 5009 01/28/20 MJU/SDR  Enterobacter cloacae complex NOT DETECTED NOT DETECTED Final   Escherichia coli DETECTED (A) NOT DETECTED Final    Comment: CRITICAL RESULT CALLED TO, READ BACK BY AND VERIFIED WITH: MORGAN CUNNINGHAM @1652  01/28/20 MJU    Klebsiella aerogenes NOT DETECTED NOT DETECTED Final   Klebsiella oxytoca NOT DETECTED NOT DETECTED Final   Klebsiella pneumoniae NOT DETECTED NOT DETECTED Final   Proteus species NOT DETECTED NOT DETECTED Final   Salmonella species NOT DETECTED NOT DETECTED Final   Serratia marcescens NOT DETECTED NOT DETECTED Final   Haemophilus influenzae NOT DETECTED NOT DETECTED Final   Neisseria meningitidis NOT DETECTED NOT DETECTED Final   Pseudomonas aeruginosa NOT  DETECTED NOT DETECTED Final   Stenotrophomonas maltophilia NOT DETECTED NOT DETECTED Final   Candida albicans NOT DETECTED NOT DETECTED Final   Candida auris NOT DETECTED NOT DETECTED Final   Candida glabrata NOT DETECTED NOT DETECTED Final   Candida krusei NOT DETECTED NOT DETECTED Final   Candida parapsilosis NOT DETECTED NOT DETECTED Final   Candida tropicalis NOT DETECTED NOT DETECTED Final   Cryptococcus neoformans/gattii NOT DETECTED NOT DETECTED Final   CTX-M ESBL NOT DETECTED NOT DETECTED Final   Carbapenem resistance IMP NOT DETECTED NOT DETECTED Final   Carbapenem resistance KPC NOT DETECTED NOT DETECTED Final   Carbapenem resistance NDM NOT DETECTED NOT DETECTED Final   Carbapenem resist OXA 48 LIKE NOT DETECTED NOT DETECTED Final   Carbapenem resistance VIM NOT DETECTED NOT DETECTED Final    Comment: Performed at [facility], [address] [place], [place] [address]  Culture, blood (Routine x 2)     Status: None (Preliminary result)   Collection Time: 01/28/20  5:09 AM   Specimen: BLOOD  Result Value Ref Range Status   Specimen Description BLOOD RIGHT ANTECUBITAL  Final   Special Requests   Final    BOTTLES DRAWN AEROBIC AND ANAEROBIC Blood Culture results may not be optimal due to an excessive volume of blood received in culture bottles   Culture   Final    NO GROWTH 3 DAYS Performed at [facility], [address] [place], [place] [address]    Report Status PENDING  Incomplete  SARS Coronavirus 2 by RT PCR (hospital order, performed in [place] hospital lab) Nasopharyngeal Nasopharyngeal Swab     Status: None   Collection Time: 01/28/20  5:09 AM   Specimen: Nasopharyngeal Swab  Result Value Ref Range Status   SARS Coronavirus 2 NEGATIVE NEGATIVE Final    Comment: (NOTE) SARS-CoV-2 target nucleic acids are NOT DETECTED.  The SARS-CoV-2 RNA is generally detectable in upper and lower respiratory specimens during the acute phase of infection.  The lowest concentration of SARS-CoV-2 viral copies this assay can detect is 250 copies / mL. A negative result does not preclude SARS-CoV-2 infection and should not be used as the sole basis for treatment or other patient management decisions.  A negative result may occur with improper specimen collection / handling, submission of specimen other than nasopharyngeal swab, presence of viral mutation(s) within the areas targeted by this assay, and inadequate number of viral copies (<250 copies / mL). A negative result must be combined with clinical observations, patient history, and epidemiological information.  Fact Sheet for Patients:   StrictlyIdeas.no  Fact Sheet for Healthcare Providers: BankingDealers.co.za  This test is not yet approved or  cleared by the [place] FDA and has been authorized for detection and/or diagnosis of SARS-CoV-2 by FDA under an Emergency Use Authorization (EUA).  This EUA will remain in effect (meaning this test can be used) for the duration of the COVID-19 declaration under Section 564(b)(1) of the Act, 21 U.S.C. section 360bbb-3(b)(1), unless the authorization is terminated or revoked sooner.  Performed at [facility], [address] [place], [place] [address]   Urine Culture     Status: Abnormal   Collection Time: 01/28/20  5:09 AM   Specimen: Urine, Random  Result Value Ref Range Status   Specimen Description   Final    URINE, RANDOM Performed at [facility], [address] [place], [place] [address]    Special Requests   Final    Normal Performed at [facility], [place], [place] [address]    Culture >=100,000 COLONIES/mL ESCHERICHIA COLI (A)  Final   Report Status 01/30/2020 FINAL  Final   Organism ID, Bacteria ESCHERICHIA COLI (A)  Final      Susceptibility   Escherichia coli - MIC*    AMPICILLIN <=2 SENSITIVE Sensitive     CEFAZOLIN  <=4 SENSITIVE Sensitive     CEFTRIAXONE <=0.25 SENSITIVE Sensitive     CIPROFLOXACIN <=0.25 SENSITIVE Sensitive     GENTAMICIN <=1 SENSITIVE Sensitive     IMIPENEM <=0.25 SENSITIVE Sensitive     NITROFURANTOIN <=16 SENSITIVE Sensitive     TRIMETH/SULFA <=20 SENSITIVE Sensitive     AMPICILLIN/SULBACTAM <=2 SENSITIVE Sensitive     PIP/TAZO <=4 SENSITIVE Sensitive     * >=100,000 COLONIES/mL ESCHERICHIA COLI  C Difficile Quick Screen w PCR reflex     Status: Abnormal   Collection Time: 01/28/20  6:31 PM   Specimen: STOOL  Result Value Ref Range Status   C Diff antigen POSITIVE (A) NEGATIVE Final   C Diff toxin NEGATIVE NEGATIVE Final   C Diff interpretation Results are indeterminate. See PCR results.  Final    Comment: Performed at [facility], [address] [place], [place] [address]  Gastrointestinal Panel by PCR , Stool     Status: None   Collection Time: 01/28/20  6:31 PM   Specimen: STOOL  Result Value Ref Range Status   Campylobacter species NOT DETECTED NOT DETECTED Final   Plesimonas shigelloides NOT DETECTED NOT DETECTED Final   Salmonella species NOT DETECTED NOT DETECTED Final   Yersinia enterocolitica NOT DETECTED NOT DETECTED Final   Vibrio species NOT DETECTED NOT DETECTED Final   Vibrio cholerae NOT DETECTED NOT DETECTED Final   Enteroaggregative E coli (EAEC) NOT DETECTED NOT DETECTED Final   Enteropathogenic E coli (EPEC) NOT DETECTED NOT DETECTED Final   Enterotoxigenic E coli (ETEC) NOT DETECTED NOT DETECTED Final   Shiga like toxin producing E coli (STEC) NOT DETECTED NOT DETECTED Final   Shigella/Enteroinvasive E coli (EIEC) NOT DETECTED NOT DETECTED Final   Cryptosporidium NOT DETECTED NOT DETECTED Final   Cyclospora cayetanensis NOT DETECTED NOT DETECTED Final   Entamoeba histolytica NOT DETECTED NOT DETECTED Final   Giardia lamblia NOT DETECTED NOT DETECTED Final   Adenovirus F40/41 NOT DETECTED NOT DETECTED Final   Astrovirus NOT DETECTED NOT  DETECTED Final   Norovirus GI/GII NOT DETECTED NOT DETECTED Final   Rotavirus A NOT DETECTED NOT DETECTED Final   Sapovirus (I, II, IV, and V) NOT DETECTED NOT DETECTED Final    Comment: Performed at [facility], [address] [place], [place] [address]  C. Diff by PCR, Reflexed     Status: None   Collection Time: 01/28/20  6:31 PM  Result  Value Ref Range Status   Toxigenic C. Difficile by PCR NEGATIVE NEGATIVE Final    Comment: Patient is colonized with non toxigenic C. difficile. May not need treatment unless significant symptoms are present. Performed at [facility], [address] [place], [place] [address]     Procedures and diagnostic studies:  No results found.             LOS: 3 days   Dotty Gonzalo  [facility]   Pager on [email or web address]. If 7PM-7AM, please contact night-coverage at [email or web address]     01/31/2020, 10:12 AM

## 2020-01-31 NOTE — Care Management Important Message (Signed)
Important Message  Patient Details  Name: [name] MRN: [id number] Date of Birth: 1950-07-07   Medicare Important Message Given:  Yes     [name] 01/31/2020, 1:11 PM

## 2020-02-01 MED ORDER — METOPROLOL TARTRATE 50 MG PO TABS
50.0000 mg | ORAL_TABLET | Freq: Two times a day (BID) | ORAL | Status: DC
  Administered 2020-02-01 – 2020-02-03 (×5): 50 mg via ORAL
  Filled 2020-02-01 (×5): qty 1

## 2020-02-01 MED ORDER — HALOPERIDOL 1 MG PO TABS
1.0000 mg | ORAL_TABLET | Freq: Three times a day (TID) | ORAL | Status: AC
  Administered 2020-02-01 – 2020-02-02 (×4): 1 mg via ORAL
  Filled 2020-02-01 (×4): qty 1

## 2020-02-01 MED ORDER — HYDRALAZINE HCL 20 MG/ML IJ SOLN
10.0000 mg | Freq: Four times a day (QID) | INTRAMUSCULAR | Status: AC | PRN
  Administered 2020-02-01: 10 mg via INTRAVENOUS
  Filled 2020-02-01: qty 1

## 2020-02-01 MED ORDER — HALOPERIDOL 2 MG PO TABS
2.0000 mg | ORAL_TABLET | Freq: Three times a day (TID) | ORAL | Status: DC
  Administered 2020-02-01: 2 mg via ORAL
  Filled 2020-02-01 (×3): qty 1

## 2020-02-01 NOTE — Progress Notes (Signed)
Patient is very confused, pulling at condom catheter so mittens were placed on hands. He also refused to take his cardizem this morning.  Will continue to monitor.  Christene Slates

## 2020-02-01 NOTE — Progress Notes (Signed)
Physical Therapy Treatment Patient Details Name: [name] MRN: [id number] DOB: 03-02-1951 Today's Date: 02/01/2020    History of Present Illness [name] is a 69 y.o. male with medical history significant of hypertension, hyperlipidemia, diet-controlled diabetes, stroke, gout, PVD, varicose vein legs, atrial fibrillation, PE/DVT on Eliquis, left renal infarct, tobacco abuse, CHF with EF of 40-45%, who presents with altered mental status, fever, chills, chest pain, diarrhea.    PT Comments    Pt was long sitting in bed upon arriving. He is alert but is confused. Was able to follow commands consistently however has poor insight of deficits and poor safety awareness. Was able to exit L side of bed without physical assistance but struggles with motor planning. Stood to [employer] and ambulated in hallway but has several occasions of unsteadiness with assistance required to prevent fall. Much improved from previous date but will continue to require reenforcement. Pt returned to room, in bed with bed alarm set, call bell in reach, and RN aware of pt's abilities. PT recommends DC to SNF to address balance, strength, and endurance deficits while improving safety.    Follow Up Recommendations  SNF;Supervision/Assistance - 24 hour;Supervision - Intermittent;Supervision for mobility/OOB     Equipment Recommendations  Other (comment) (defer to next level of care)    Recommendations for Other Services       Precautions / Restrictions Precautions Precautions: Fall Precaution Comments: high fall risk Restrictions Weight Bearing Restrictions: No    Mobility  Bed Mobility Overal bed mobility: Needs Assistance Bed Mobility: Supine to Sit;Sit to Supine     Supine to sit: HOB elevated;Supervision Sit to supine: Supervision   General bed mobility comments: Pt was able to exit L side of bed with supervision and return to supine with supervision. pt does require increased time and has a  little difficulty with motor planning.  Transfers Overall transfer level: Needs assistance Equipment used: Rolling walker (2 wheeled) Transfers: Sit to/from Stand Sit to Stand: Min guard;Min assist         General transfer comment: CGA  for standing. Min assist for eccentric controlled lowering to sitting. poor safety awareness  Ambulation/Gait Ambulation/Gait assistance: Min assist Gait Distance (Feet): 160 Feet Assistive device: Rolling walker (2 wheeled) Gait Pattern/deviations: Trunk flexed;Staggering left;Staggering right Gait velocity: decreased   General Gait Details: pt was able to ambulate 1 lap in hallway with RW with CGA mostly but does have occasions of min assist to prevent fall. He is unsteady on his feet.       Balance Overall balance assessment: Needs assistance Sitting-balance support: Feet supported;No upper extremity supported Sitting balance-Leahy Scale: Good Sitting balance - Comments: no LOB in sitting    Standing balance support: Bilateral upper extremity supported;During functional activity Standing balance-Leahy Scale: Fair Standing balance comment: pt has balance deficits especially dynamic balance.       Cognition Arousal/Alertness: Awake/alert (disoriented and confused but able to follow commands) Behavior During Therapy: Impulsive;Restless Overall Cognitive Status: Impaired/Different from baseline Area of Impairment: Safety/judgement;Attention;Following commands;Memory;Problem solving;Awareness      Current Attention Level: Selective Memory: Decreased recall of precautions;Decreased short-term memory Following Commands: Follows one step commands consistently Safety/Judgement: Decreased awareness of safety;Decreased awareness of deficits Awareness: Intellectual Problem Solving: Slow processing;Requires tactile cues;Requires verbal cues;Difficulty sequencing;Decreased initiation General Comments: pt was long sitting in bed, awake , and  agreeable to PT session. he is confused and disoriented but was able to follow commands and is cooperative throughout.  Pertinent Vitals/Pain Pain Assessment: No/denies pain Pain Score: 0-No pain Faces Pain Scale: No hurt           PT Goals (current goals can now be found in the care plan section) Acute Rehab PT Goals Patient Stated Goal: none stated Progress towards PT goals: Progressing toward goals    Frequency    Min 2X/week      PT Plan Current plan remains appropriate       AM-PAC PT "6 Clicks" Mobility   Outcome Measure  Help needed turning from your back to your side while in a flat bed without using bedrails?: A Little Help needed moving from lying on your back to sitting on the side of a flat bed without using bedrails?: A Little Help needed moving to and from a bed to a chair (including a wheelchair)?: A Little Help needed standing up from a chair using your arms (e.g., wheelchair or bedside chair)?: A Little Help needed to walk in hospital room?: A Lot Help needed climbing 3-5 steps with a railing? : A Lot 6 Click Score: 16    End of Session Equipment Utilized During Treatment: Gait belt Activity Tolerance: Patient tolerated treatment well;Patient limited by fatigue Patient left: in bed;with bed alarm set;with call bell/phone within reach Nurse Communication: Mobility status PT Visit Diagnosis: Muscle weakness (generalized) (M62.81);Difficulty in walking, not elsewhere classified (R26.2);Unsteadiness on feet (R26.81)     Time: 5170-0174 PT Time Calculation (min) (ACUTE ONLY): 24 min  Charges:  $Gait Training: 8-22 mins $Therapeutic Activity: 8-22 mins                     Julaine Fusi PTA 02/01/20, 12:36 PM

## 2020-02-01 NOTE — TOC Progression Note (Addendum)
Transition of Care [facility]) - Progression Note    Patient Details  Name: [name] MRN: [id number] Date of Birth: 1950-08-30  Transition of Care [facility]) CM/SW Contact  Beverly Sessions, RN Phone Number: 02/01/2020, 2:08 PM  Clinical Narrative:     Patient still with confusion.  He defers all questions to his long time girlfriend [name].  I have called and left her a voicemail to present bed offers.  Awaiting return call    Update:  Return call from [place].  She accepted [facility]. Submitted auth through navi portal   Expected Discharge Plan: [place] Barriers to Discharge: Continued Medical Work up  Expected Discharge Plan and Services Expected Discharge Plan: [place] arrangements for the past 2 months: Apartment                                       Social Determinants of Health (SDOH) Interventions    Readmission Risk Interventions Readmission Risk Prevention Plan 01/29/2020  Transportation Screening Complete  Palliative Care Screening Not Applicable  Medication Review (RN Care Manager) Complete  Some recent data might be hidden

## 2020-02-01 NOTE — Progress Notes (Signed)
Notified Dr. Damita Dunnings regarding patient's blood pressure- 179/109. Orders were given for IV hydralazine. Will continue to monitor. Christene Slates

## 2020-02-01 NOTE — Progress Notes (Addendum)
Progress Note    [name]  YYT:[id number] DOB: January 09, 1951  DOA: 01/28/2020 PCP: Theotis Burrow, MD      Brief Narrative:    Medical records reviewed and are as summarized below:  [name] is a 69 y.o. male       Assessment/Plan:   Principal Problem:   UTI (urinary tract infection) Active Problems:   Cerebral infarction ([facility])   DVT (deep venous thrombosis) ([facility])   Essential hypertension   Gout   Pulmonary embolism ([facility])   Chronic a-fib ([facility])   Severe sepsis ([facility])   Tobacco abuse   Elevated troponin   Hypokalemia   Diarrhea   Chest pain   Chronic combined systolic (congestive) and diastolic (congestive) heart failure (H[facility])   Acute metabolic encephalopathy Atrial fibrillation with RVR Hallucination/confusion/delirium  Body mass index is 27.71 kg/m.    PLAN  Continue oral Cardizem for A. fib Increase metoprolol from 25 mg twice daily to 50 mg twice daily for adequate rate control  Continue oral Levaquin for E. coli sepsis, UTI and bacteremia  Low-dose Haldol has been started for possible delirium.  Continue Eliquis for PE and stroke prophylaxis  Plan to discharge to SNF when confusion improves and heart rate is controlled.     Diet Order            Diet Heart Room service appropriate? Yes; Fluid consistency: Thin  Diet effective now                      Medications:   . allopurinol  300 mg Oral Daily  . apixaban  5 mg Oral BID  . aspirin EC  81 mg Oral Daily  . atorvastatin  80 mg Oral QPM  . diltiazem  60 mg Oral Q8H  . gabapentin  400 mg Oral TID  . haloperidol  2 mg Oral TID  . influenza vaccine adjuvanted  0.5 mL Intramuscular Tomorrow-1000  . latanoprost  1 drop Both Eyes QHS  . levofloxacin  500 mg Oral Daily  . metoprolol tartrate  50 mg Oral BID  . nicotine  7 mg Transdermal Daily   Continuous Infusions:    Anti-infectives (From admission, onward)   Start     Dose/Rate Route Frequency  Ordered Stop   01/31/20 1100  levofloxacin (LEVAQUIN) tablet 500 mg        500 mg Oral Daily 01/31/20 1012 02/04/20 0959   01/28/20 1800  cefTRIAXone (ROCEPHIN) 2 g in sodium chloride 0.9 % 100 mL IVPB  Status:  Discontinued        2 g 200 mL/hr over 30 Minutes Intravenous Every 24 hours 01/28/20 1706 01/31/20 1012   01/28/20 0515  cefTRIAXone (ROCEPHIN) 1 g in sodium chloride 0.9 % 100 mL IVPB  Status:  Discontinued        1 g 200 mL/hr over 30 Minutes Intravenous Every 24 hours 01/28/20 0510 01/28/20 1706             Family Communication/Anticipated D/C date and plan/Code Status   DVT prophylaxis:  apixaban (ELIQUIS) tablet 5 mg     Code Status: Full Code  Family Communication: Plan discussed with patient Disposition Plan:    Status is: Inpatient  Remains inpatient appropriate because:Inpatient level of care appropriate due to severity of illness   Dispo: The patient is from: Home              Anticipated d/c is to: Home  Anticipated d/c date is: 1 day              Patient currently is not medically stable to d/c.           Subjective:   Overnight events noted.  According to nursing staff, patient was more confused and mittens had to be placed on his hands.  However, at the time I saw him, mittens had been removed and patient's confusion was better.  Objective:    Vitals:   02/01/20 0529 02/01/20 0834 02/01/20 1319 02/01/20 1423  BP: (!) 181/107 (!) 166/108 (!) 134/93 (!) 143/98  Pulse: 95 84  77  Resp:    16  Temp:    98 F (36.7 C)  TempSrc:    Oral  SpO2:  95%  100%  Weight:      Height:       No data found.   Intake/Output Summary (Last 24 hours) at 02/01/2020 1628 Last data filed at 02/01/2020 0845 Gross per 24 hour  Intake 0 ml  Output 750 ml  Net -750 ml   Filed Weights   01/28/20 0430 01/28/20 2152  Weight: 92.6 kg 97.9 kg    Exam:  GEN: NAD SKIN: Warm and dry EYES: EOMI ENT: MMM CV: Irregular rate,  tachycardic PULM: No wheezing or rales heard. ABD: soft, ND, NT, +BS CNS: AAO x 2 (person), non focal, slurred speech at baseline. EXT: No edema or tenderness      Data Reviewed:   I have personally reviewed following labs and imaging studies:  Labs: Labs show the following:   Basic Metabolic Panel: Recent Labs  Lab 01/28/20 0444 01/28/20 0444 01/29/20 0611 01/29/20 0611 01/30/20 0547 01/31/20 1056  NA 133*  --  135  --  136  --   K 3.4*   < > 4.0   < > 3.5 3.9  CL 99  --  101  --  102  --   CO2 24  --  23  --  25  --   GLUCOSE 110*  --  108*  --  113*  --   BUN 20  --  25*  --  20  --   CREATININE 1.11  --  0.99  --  0.92  --   CALCIUM 8.6*  --  8.4*  --  8.3*  --   MG  --   --  2.1  --   --  1.9   < > = values in this interval not displayed.   GFR Estimated Creatinine Clearance: 85.6 mL/min (by C-G formula based on SCr of 0.92 mg/dL). Liver Function Tests: Recent Labs  Lab 01/28/20 0444  AST 31  ALT 22  ALKPHOS 123  BILITOT 2.1*  PROT 7.4  ALBUMIN 3.5   No results for input(s): LIPASE, AMYLASE in the last 168 hours. No results for input(s): AMMONIA in the last 168 hours. Coagulation profile Recent Labs  Lab 01/28/20 0444  INR 1.3*    CBC: Recent Labs  Lab 01/28/20 0444 01/29/20 0611 01/30/20 0547  WBC 2.9* 10.9* 8.3  NEUTROABS 2.5  --   --   HGB 11.9* 11.3* 9.8*  HCT 35.6* 32.3* 29.0*  MCV 75.9* 74.1* 75.3*  PLT 133* 118* 139*   Cardiac Enzymes: No results for input(s): CKTOTAL, CKMB, CKMBINDEX, TROPONINI in the last 168 hours. BNP (last 3 results) No results for input(s): PROBNP in the last 8760 hours. CBG: No results for input(s): GLUCAP in the last 168  hours. D-Dimer: No results for input(s): DDIMER in the last 72 hours. Hgb A1c: No results for input(s): HGBA1C in the last 72 hours. Lipid Profile: No results for input(s): CHOL, HDL, LDLCALC, TRIG, CHOLHDL, LDLDIRECT in the last 72 hours. Thyroid function studies: No results for  input(s): TSH, T4TOTAL, T3FREE, THYROIDAB in the last 72 hours.  Invalid input(s): FREET3 Anemia work up: No results for input(s): VITAMINB12, FOLATE, FERRITIN, TIBC, IRON, RETICCTPCT in the last 72 hours. Sepsis Labs: Recent Labs  Lab 01/28/20 0444 01/28/20 0628 01/28/20 0845 01/29/20 0611 01/30/20 0547  WBC 2.9*  --   --  10.9* 8.3  LATICACIDVEN 2.1* 1.9 1.8  --   --     Microbiology Recent Results (from the past 240 hour(s))  Culture, blood (Routine x 2)     Status: Abnormal   Collection Time: 01/28/20  4:44 AM   Specimen: BLOOD  Result Value Ref Range Status   Specimen Description   Final    BLOOD LEFT ANTECUBITAL Performed at [facility], [address] [place], [place] [address]    Special Requests   Final    BOTTLES DRAWN AEROBIC AND ANAEROBIC Blood Culture results may not be optimal due to an excessive volume of blood received in culture bottles Performed at [facility], [address] [place], [place] [address]    Culture  Setup Time   Final    ANAEROBIC BOTTLE ONLY GRAM NEGATIVE RODS Organism ID to follow CRITICAL RESULT CALLED TO, READ BACK BY AND VERIFIED WITH: MORGAN CUNNINGHAM @1652  01/28/20 MJU    Culture ESCHERICHIA COLI (A)  Final   Report Status 01/31/2020 FINAL  Final   Organism ID, Bacteria ESCHERICHIA COLI  Final      Susceptibility   Escherichia coli - MIC*    AMPICILLIN <=2 SENSITIVE Sensitive     CEFAZOLIN <=4 SENSITIVE Sensitive     CEFEPIME <=0.12 SENSITIVE Sensitive     CEFTAZIDIME <=1 SENSITIVE Sensitive     CEFTRIAXONE <=0.25 SENSITIVE Sensitive     CIPROFLOXACIN <=0.25 SENSITIVE Sensitive     GENTAMICIN <=1 SENSITIVE Sensitive     IMIPENEM <=0.25 SENSITIVE Sensitive     TRIMETH/SULFA <=20 SENSITIVE Sensitive     AMPICILLIN/SULBACTAM <=2 SENSITIVE Sensitive     PIP/TAZO <=4 SENSITIVE Sensitive     * ESCHERICHIA COLI  Blood Culture ID Panel (Reflexed)     Status: Abnormal   Collection Time: 01/28/20  4:44 AM   Result Value Ref Range Status   Enterococcus faecalis NOT DETECTED NOT DETECTED Final   Enterococcus Faecium NOT DETECTED NOT DETECTED Final   Listeria monocytogenes NOT DETECTED NOT DETECTED Final   Staphylococcus species NOT DETECTED NOT DETECTED Final   Staphylococcus aureus (BCID) NOT DETECTED NOT DETECTED Final   Staphylococcus epidermidis NOT DETECTED NOT DETECTED Final   Staphylococcus lugdunensis NOT DETECTED NOT DETECTED Final   Streptococcus species NOT DETECTED NOT DETECTED Final   Streptococcus agalactiae NOT DETECTED NOT DETECTED Final   Streptococcus pneumoniae NOT DETECTED NOT DETECTED Final   Streptococcus pyogenes NOT DETECTED NOT DETECTED Final   A.calcoaceticus-baumannii NOT DETECTED NOT DETECTED Final   Bacteroides fragilis NOT DETECTED NOT DETECTED Final   Enterobacterales DETECTED (A) NOT DETECTED Final    Comment: Enterobacterales represent a large order of gram negative bacteria, not a single organism. CRITICAL RESULT CALLED TO, READ BACK BY AND VERIFIED WITH: [facility] AT 1652 01/28/20 MJU/SDR    Enterobacter cloacae complex NOT DETECTED NOT DETECTED Final   Escherichia coli  DETECTED (A) NOT DETECTED Final    Comment: CRITICAL RESULT CALLED TO, READ BACK BY AND VERIFIED WITH: MORGAN CUNNINGHAM @1652  01/28/20 MJU    Klebsiella aerogenes NOT DETECTED NOT DETECTED Final   Klebsiella oxytoca NOT DETECTED NOT DETECTED Final   Klebsiella pneumoniae NOT DETECTED NOT DETECTED Final   Proteus species NOT DETECTED NOT DETECTED Final   Salmonella species NOT DETECTED NOT DETECTED Final   Serratia marcescens NOT DETECTED NOT DETECTED Final   Haemophilus influenzae NOT DETECTED NOT DETECTED Final   Neisseria meningitidis NOT DETECTED NOT DETECTED Final   Pseudomonas aeruginosa NOT DETECTED NOT DETECTED Final   Stenotrophomonas maltophilia NOT DETECTED NOT DETECTED Final   Candida albicans NOT DETECTED NOT DETECTED Final   Candida auris NOT DETECTED NOT DETECTED  Final   Candida glabrata NOT DETECTED NOT DETECTED Final   Candida krusei NOT DETECTED NOT DETECTED Final   Candida parapsilosis NOT DETECTED NOT DETECTED Final   Candida tropicalis NOT DETECTED NOT DETECTED Final   Cryptococcus neoformans/gattii NOT DETECTED NOT DETECTED Final   CTX-M ESBL NOT DETECTED NOT DETECTED Final   Carbapenem resistance IMP NOT DETECTED NOT DETECTED Final   Carbapenem resistance KPC NOT DETECTED NOT DETECTED Final   Carbapenem resistance NDM NOT DETECTED NOT DETECTED Final   Carbapenem resist OXA 48 LIKE NOT DETECTED NOT DETECTED Final   Carbapenem resistance VIM NOT DETECTED NOT DETECTED Final    Comment: Performed at [facility], [address] [place], [place] [address]  Culture, blood (Routine x 2)     Status: None (Preliminary result)   Collection Time: 01/28/20  5:09 AM   Specimen: BLOOD  Result Value Ref Range Status   Specimen Description BLOOD RIGHT ANTECUBITAL  Final   Special Requests   Final    BOTTLES DRAWN AEROBIC AND ANAEROBIC Blood Culture results may not be optimal due to an excessive volume of blood received in culture bottles   Culture   Final    NO GROWTH 4 DAYS Performed at [facility], [address] [place], [place] [address]    Report Status PENDING  Incomplete  SARS Coronavirus 2 by RT PCR (hospital order, performed in [facility] hospital lab) Nasopharyngeal Nasopharyngeal Swab     Status: None   Collection Time: 01/28/20  5:09 AM   Specimen: Nasopharyngeal Swab  Result Value Ref Range Status   SARS Coronavirus 2 NEGATIVE NEGATIVE Final    Comment: (NOTE) SARS-CoV-2 target nucleic acids are NOT DETECTED.  The SARS-CoV-2 RNA is generally detectable in upper and lower respiratory specimens during the acute phase of infection. The lowest concentration of SARS-CoV-2 viral copies this assay can detect is 250 copies / mL. A negative result does not preclude SARS-CoV-2 infection and should not be used as the  sole basis for treatment or other patient management decisions.  A negative result may occur with improper specimen collection / handling, submission of specimen other than nasopharyngeal swab, presence of viral mutation(s) within the areas targeted by this assay, and inadequate number of viral copies (<250 copies / mL). A negative result must be combined with clinical observations, patient history, and epidemiological information.  Fact Sheet for Patients:   StrictlyIdeas.no  Fact Sheet for Healthcare Providers: BankingDealers.co.za  This test is not yet approved or  cleared by the [place] FDA and has been authorized for detection and/or diagnosis of SARS-CoV-2 by FDA under an Emergency Use Authorization (EUA).  This EUA will remain in effect (meaning this test can be  used) for the duration of the COVID-19 declaration under Section 564(b)(1) of the Act, 21 U.S.C. section 360bbb-3(b)(1), unless the authorization is terminated or revoked sooner.  Performed at [facility], [address] [place], [place] [address]   Urine Culture     Status: Abnormal   Collection Time: 01/28/20  5:09 AM   Specimen: Urine, Random  Result Value Ref Range Status   Specimen Description   Final    URINE, RANDOM Performed at [facility], [address] [place], [place] [address]    Special Requests   Final    Normal Performed at [facility], [place], [place] [address]    Culture >=100,000 COLONIES/mL ESCHERICHIA COLI (A)  Final   Report Status 01/30/2020 FINAL  Final   Organism ID, Bacteria ESCHERICHIA COLI (A)  Final      Susceptibility   Escherichia coli - MIC*    AMPICILLIN <=2 SENSITIVE Sensitive     CEFAZOLIN <=4 SENSITIVE Sensitive     CEFTRIAXONE <=0.25 SENSITIVE Sensitive     CIPROFLOXACIN <=0.25 SENSITIVE Sensitive     GENTAMICIN <=1 SENSITIVE Sensitive     IMIPENEM <=0.25 SENSITIVE  Sensitive     NITROFURANTOIN <=16 SENSITIVE Sensitive     TRIMETH/SULFA <=20 SENSITIVE Sensitive     AMPICILLIN/SULBACTAM <=2 SENSITIVE Sensitive     PIP/TAZO <=4 SENSITIVE Sensitive     * >=100,000 COLONIES/mL ESCHERICHIA COLI  C Difficile Quick Screen w PCR reflex     Status: Abnormal   Collection Time: 01/28/20  6:31 PM   Specimen: STOOL  Result Value Ref Range Status   C Diff antigen POSITIVE (A) NEGATIVE Final   C Diff toxin NEGATIVE NEGATIVE Final   C Diff interpretation Results are indeterminate. See PCR results.  Final    Comment: Performed at [facility], [address] [place], [place] [address]  Gastrointestinal Panel by PCR , Stool     Status: None   Collection Time: 01/28/20  6:31 PM   Specimen: STOOL  Result Value Ref Range Status   Campylobacter species NOT DETECTED NOT DETECTED Final   Plesimonas shigelloides NOT DETECTED NOT DETECTED Final   Salmonella species NOT DETECTED NOT DETECTED Final   Yersinia enterocolitica NOT DETECTED NOT DETECTED Final   Vibrio species NOT DETECTED NOT DETECTED Final   Vibrio cholerae NOT DETECTED NOT DETECTED Final   Enteroaggregative E coli (EAEC) NOT DETECTED NOT DETECTED Final   Enteropathogenic E coli (EPEC) NOT DETECTED NOT DETECTED Final   Enterotoxigenic E coli (ETEC) NOT DETECTED NOT DETECTED Final   Shiga like toxin producing E coli (STEC) NOT DETECTED NOT DETECTED Final   Shigella/Enteroinvasive E coli (EIEC) NOT DETECTED NOT DETECTED Final   Cryptosporidium NOT DETECTED NOT DETECTED Final   Cyclospora cayetanensis NOT DETECTED NOT DETECTED Final   Entamoeba histolytica NOT DETECTED NOT DETECTED Final   Giardia lamblia NOT DETECTED NOT DETECTED Final   Adenovirus F40/41 NOT DETECTED NOT DETECTED Final   Astrovirus NOT DETECTED NOT DETECTED Final   Norovirus GI/GII NOT DETECTED NOT DETECTED Final   Rotavirus A NOT DETECTED NOT DETECTED Final   Sapovirus (I, II, IV, and V) NOT DETECTED NOT DETECTED Final     Comment: Performed at [facility], [address] [place], [place] [address]  C. Diff by PCR, Reflexed     Status: None   Collection Time: 01/28/20  6:31 PM  Result Value Ref Range Status   Toxigenic C. Difficile by PCR  NEGATIVE NEGATIVE Final    Comment: Patient is colonized with non toxigenic C. difficile. May not need treatment unless significant symptoms are present. Performed at [facility], [address] [place], [place] [address]     Procedures and diagnostic studies:  No results found.             LOS: 4 days   Mervyn Pflaum  [facility]   Pager on [email or web address]. If 7PM-7AM, please contact night-coverage at [email or web address]     02/01/2020, 4:28 PM

## 2020-02-02 ENCOUNTER — Inpatient Hospital Stay: Payer: Medicare Other

## 2020-02-02 LAB — BASIC METABOLIC PANEL
Anion gap: 13 (ref 5–15)
BUN: 10 mg/dL (ref 8–23)
CO2: 22 mmol/L (ref 22–32)
Calcium: 8.9 mg/dL (ref 8.9–10.3)
Chloride: 101 mmol/L (ref 98–111)
Creatinine, Ser: 0.91 mg/dL (ref 0.61–1.24)
GFR calc Af Amer: >60 mL/min (ref >60)
GFR calc non Af Amer: >60 mL/min (ref >60)
Glucose, Bld: 108 mg/dL — ABNORMAL HIGH (ref 70–99)
Potassium: 4.1 mmol/L (ref 3.5–5.1)
Sodium: 136 mmol/L (ref 135–145)

## 2020-02-02 LAB — CBC
HCT: 37.2 % — ABNORMAL LOW (ref 39.0–52.0)
Hemoglobin: 11.9 g/dL — ABNORMAL LOW (ref 13.0–17.0)
MCH: 25 pg — ABNORMAL LOW (ref 26.0–34.0)
MCHC: 32 g/dL (ref 30.0–36.0)
MCV: 78.2 fL — ABNORMAL LOW (ref 80.0–100.0)
Platelets: 228 10*3/uL (ref 150–400)
RBC: 4.76 MIL/uL (ref 4.22–5.81)
RDW: 18.9 % — ABNORMAL HIGH (ref 11.5–15.5)
WBC: 5.5 10*3/uL (ref 4.0–10.5)
nRBC: 0 % (ref 0.0–0.2)

## 2020-02-02 LAB — CULTURE, BLOOD (ROUTINE X 2): Culture: NO GROWTH

## 2020-02-02 LAB — MAGNESIUM: Magnesium: 2.1 mg/dL (ref 1.7–2.4)

## 2020-02-02 MED ORDER — LEVOFLOXACIN 500 MG PO TABS
500.0000 mg | ORAL_TABLET | Freq: Every day | ORAL | 0 refills | Status: DC
Start: 2020-02-03 — End: 2020-02-03

## 2020-02-02 MED ORDER — METOPROLOL TARTRATE 50 MG PO TABS: 50.0000 mg | ORAL_TABLET | Freq: Two times a day (BID) | ORAL | Status: DC

## 2020-02-02 MED ORDER — DILTIAZEM HCL 60 MG PO TABS
60.0000 mg | ORAL_TABLET | Freq: Four times a day (QID) | ORAL | Status: DC
  Administered 2020-02-02 – 2020-02-03 (×5): 60 mg via ORAL
  Filled 2020-02-02 (×7): qty 1

## 2020-02-02 MED ORDER — DILTIAZEM HCL ER COATED BEADS 240 MG PO CP24: 240.0000 mg | ORAL_CAPSULE | Freq: Every day | ORAL | Status: DC

## 2020-02-02 NOTE — Discharge Summary (Addendum)
Physician Discharge Summary  [name] OEU:[id number] DOB: 10/16/1950 DOA: 01/28/2020  PCP: Theotis Burrow, MD  Admit date: 01/28/2020 Discharge date: 02/02/2020  Discharge disposition: Skilled nursing facility   Recommendations for Outpatient Follow-Up:   Follow-up with physician at the nursing home within 3 days of discharge   Discharge Diagnosis:   Principal Problem:   UTI (urinary tract infection) Active Problems:   Cerebral infarction [facility])   DVT (deep venous thrombosis) (HCC)   Essential hypertension   Gout   Pulmonary embolism (HCC)   Chronic a-fib (HCC)   Severe sepsis (HCC)   Tobacco abuse   Elevated troponin   Hypokalemia   Diarrhea   Chest pain   Chronic combined systolic (congestive) and diastolic (congestive) heart failure (HCC)   Acute metabolic encephalopathy    Discharge Condition: Stable.  Diet recommendation:  Diet Order            Diet - low sodium heart healthy           Diet Heart Room service appropriate? Yes; Fluid consistency: Thin  Diet effective now                   Code Status: Full Code     Hospital Course:   [name] is a 69 y.o. male with medical history significant of hypertension, hyperlipidemia, diet-controlled diabetes, stroke, gout, PVD, varicose vein legs, atrial fibrillation, PE/DVT on Eliquis, left renal infarct, tobacco abuse, CHF with EF of 40-45%, who presented to the hospital with confusion, fever, chills, chest pain and diarrhea.  According to his girlfriend, patient has had some confusion at baseline since he had a stroke.  However, his confusion had worsened.  He was admitted to the hospital for severe sepsis secondary to UTI.  He was treated with IV fluids and empiric IV antibiotics.  Urine culture and blood culture revealed E. coli consistent with E. coli sepsis secondary to E. coli bacteremia and UTI.  He was initially treated with IV Rocephin but this was switched to oral  Levaquin.  He had intermittent episodes of hallucination and worsening confusion during this hospitalization which was attributed to delirium.  He also developed atrial fibrillation with rapid ventricular response.  His metoprolol was increased from 12.5 mg twice daily to 50 mg twice daily and oral Cardizem was added for adequate rate control.  Heart rate has improved.  He was evaluated by PT and OT who recommended further rehabilitation at a skilled nursing facility.  Overall, his condition has improved and he is deemed stable for discharge to SNF today.  Discharge plan was discussed with the patient and his girlfriend, Ms. Farrel Gobble. Ms. Jerline Pain confirmed that patient has had some confusion post stroke (in April 2021) and she is concerned that patient may be developing dementia.  Of note, CT of the head was done twice during this hospitalization, there was no evidence of acute stroke.      Discharge Exam:    Vitals:   02/01/20 2030 02/02/20 0515 02/02/20 0637 02/02/20 1000  BP: (!) 157/95 (!) 171/109 (!) 164/110 140/86  Pulse: 92 78 84   Resp: 20 20    Temp: 98.2 F (36.8 C) 97.9 F (36.6 C)    TempSrc: Oral Oral    SpO2: 100% 100%    Weight:      Height:         GEN: NAD SKIN: Warm and dry EYES: EOMI ENT: MMM CV: RRR PULM: CTA B ABD: soft,  ND, NT, +BS CNS: AAO x person, non focal EXT: No edema or tenderness   The results of significant diagnostics from this hospitalization (including imaging, microbiology, ancillary and laboratory) are listed below for reference.     Procedures and Diagnostic Studies:   CT HEAD WO CONTRAST  Result Date: 01/28/2020 CLINICAL DATA:  Mental status change with unknown cause EXAM: CT HEAD WITHOUT CONTRAST TECHNIQUE: Contiguous axial images were obtained from the base of the skull through the vertex without intravenous contrast. COMPARISON:  09/13/2019 FINDINGS: Brain: The right PCA territory infarct at the occipital lobe has evolved as  expected since prior. Remote left occipital and left frontal infarcts as previously seen. Small remote right superior cerebellar infarct. No visible acute infarct, hemorrhage, hydrocephalus, or collection. Vascular: No hyperdense vessel or unexpected calcification. Skull: Hyperostosis interna Sinuses/Orbits: No acute finding IMPRESSION: 1. No acute finding. 2. Remote bilateral PCA and left MCA branch infarcts. Electronically Signed   By: Monte Fantasia M.D.   On: 01/28/2020 10:09   DG Chest Port 1 View  Result Date: 01/28/2020 CLINICAL DATA:  Confusion. EXAM: PORTABLE CHEST 1 VIEW COMPARISON:  04/08/2019 FINDINGS: The heart is enlarged but stable. Prominent mediastinal and hilar contours are unchanged. Moderate tortuosity of the thoracic aorta. Chronic lung changes but no definite acute overlying pulmonary process. Evidence of remote trauma involving the right shoulder area. No acute bony findings. IMPRESSION: Chronic lung changes but no acute pulmonary findings. Electronically Signed   By: Marijo Sanes M.D.   On: 01/28/2020 05:33   CT Renal Stone Study  Result Date: 01/28/2020 CLINICAL DATA:  UTI, sepsis.  Rule out kidney stone. EXAM: CT ABDOMEN AND PELVIS WITHOUT CONTRAST TECHNIQUE: Multidetector CT imaging of the abdomen and pelvis was performed following the standard protocol without IV contrast. COMPARISON:  CT abdomen pelvis 04/07/2019 FINDINGS: Lower chest: Lung bases clear bilaterally. Cardiac enlargement. Enlargement of the right atrium and right ventricle similar to the prior study. Hepatobiliary: Punctate calcification in the inferior right lobe of the liver unchanged from prior study. No liver mass. Gallbladder and bile ducts normal. Pancreas: Negative Spleen: Negative Adrenals/Urinary Tract: Negative for renal hydronephrosis. Chronic infarct left upper renal pole with associated punctate calcification. Right kidney normal. No ureteral or bladder calculus. Mild bladder wall thickening.  Stomach/Bowel: Negative for bowel obstruction. No bowel mass or edema. Appendix nonvisualized. Negative for diverticulitis. Vascular/Lymphatic: Atherosclerotic aorta and iliacs without aneurysm. No adenopathy. IVC filter unchanged in position at the L2-3 level. Reproductive: Prostate enlargement measuring 4.7 by 5.9 cm. Other: No free fluid.  Negative for hernia. Musculoskeletal: No acute skeletal abnormality. Lumbar degenerative changes with severe facet degeneration at L4-5 and L5-S1. IMPRESSION: 1. Negative for obstructing urinary tract calculus. 2. Chronic infarct left upper pole with punctate calcification. No other renal calculi. Electronically Signed   By: Franchot Gallo M.D.   On: 01/28/2020 07:30     Labs:   Basic Metabolic Panel: Recent Labs  Lab 01/28/20 0444 01/28/20 0444 01/29/20 0611 01/29/20 0611 01/30/20 0547 01/30/20 0547 01/31/20 1056 02/02/20 0852  NA 133*  --  135  --  136  --   --  136  K 3.4*   < > 4.0   < > 3.5   < > 3.9 4.1  CL 99  --  101  --  102  --   --  101  CO2 24  --  23  --  25  --   --  22  GLUCOSE 110*  --  108*  --  113*  --   --  108*  BUN 20  --  25*  --  20  --   --  10  CREATININE 1.11  --  0.99  --  0.92  --   --  0.91  CALCIUM 8.6*  --  8.4*  --  8.3*  --   --  8.9  MG  --   --  2.1  --   --   --  1.9 2.1   < > = values in this interval not displayed.   GFR Estimated Creatinine Clearance: 86.6 mL/min (by C-G formula based on SCr of 0.91 mg/dL). Liver Function Tests: Recent Labs  Lab 01/28/20 0444  AST 31  ALT 22  ALKPHOS 123  BILITOT 2.1*  PROT 7.4  ALBUMIN 3.5   No results for input(s): LIPASE, AMYLASE in the last 168 hours. No results for input(s): AMMONIA in the last 168 hours. Coagulation profile Recent Labs  Lab 01/28/20 0444  INR 1.3*    CBC: Recent Labs  Lab 01/28/20 0444 01/29/20 0611 01/30/20 0547 02/02/20 0852  WBC 2.9* 10.9* 8.3 5.5  NEUTROABS 2.5  --   --   --   HGB 11.9* 11.3* 9.8* 11.9*  HCT 35.6* 32.3*  29.0* 37.2*  MCV 75.9* 74.1* 75.3* 78.2*  PLT 133* 118* 139* 228   Cardiac Enzymes: No results for input(s): CKTOTAL, CKMB, CKMBINDEX, TROPONINI in the last 168 hours. BNP: Invalid input(s): POCBNP CBG: No results for input(s): GLUCAP in the last 168 hours. D-Dimer No results for input(s): DDIMER in the last 72 hours. Hgb A1c No results for input(s): HGBA1C in the last 72 hours. Lipid Profile No results for input(s): CHOL, HDL, LDLCALC, TRIG, CHOLHDL, LDLDIRECT in the last 72 hours. Thyroid function studies No results for input(s): TSH, T4TOTAL, T3FREE, THYROIDAB in the last 72 hours.  Invalid input(s): FREET3 Anemia work up No results for input(s): VITAMINB12, FOLATE, FERRITIN, TIBC, IRON, RETICCTPCT in the last 72 hours. Microbiology Recent Results (from the past 240 hour(s))  Culture, blood (Routine x 2)     Status: Abnormal   Collection Time: 01/28/20  4:44 AM   Specimen: BLOOD  Result Value Ref Range Status   Specimen Description   Final    BLOOD LEFT ANTECUBITAL Performed at [facility], [address], [place], [place] [address]    Special Requests   Final    BOTTLES DRAWN AEROBIC AND ANAEROBIC Blood Culture results may not be optimal due to an excessive volume of blood received in culture bottles Performed at [facility], [address] [place], [place] [address]    Culture  Setup Time   Final    ANAEROBIC BOTTLE ONLY GRAM NEGATIVE RODS Organism ID to follow CRITICAL RESULT CALLED TO, READ BACK BY AND VERIFIED WITH: MORGAN CUNNINGHAM @1652  01/28/20 MJU    Culture ESCHERICHIA COLI (A)  Final   Report Status 01/31/2020 FINAL  Final   Organism ID, Bacteria ESCHERICHIA COLI  Final      Susceptibility   Escherichia coli - MIC*    AMPICILLIN <=2 SENSITIVE Sensitive     CEFAZOLIN <=4 SENSITIVE Sensitive     CEFEPIME <=0.12 SENSITIVE Sensitive     CEFTAZIDIME <=1 SENSITIVE Sensitive     CEFTRIAXONE <=0.25 SENSITIVE Sensitive     CIPROFLOXACIN  <=0.25 SENSITIVE Sensitive     GENTAMICIN <=1 SENSITIVE Sensitive     IMIPENEM <=0.25 SENSITIVE Sensitive     TRIMETH/SULFA <=20 SENSITIVE Sensitive  AMPICILLIN/SULBACTAM <=2 SENSITIVE Sensitive     PIP/TAZO <=4 SENSITIVE Sensitive     * ESCHERICHIA COLI  Blood Culture ID Panel (Reflexed)     Status: Abnormal   Collection Time: 01/28/20  4:44 AM  Result Value Ref Range Status   Enterococcus faecalis NOT DETECTED NOT DETECTED Final   Enterococcus Faecium NOT DETECTED NOT DETECTED Final   Listeria monocytogenes NOT DETECTED NOT DETECTED Final   Staphylococcus species NOT DETECTED NOT DETECTED Final   Staphylococcus aureus (BCID) NOT DETECTED NOT DETECTED Final   Staphylococcus epidermidis NOT DETECTED NOT DETECTED Final   Staphylococcus lugdunensis NOT DETECTED NOT DETECTED Final   Streptococcus species NOT DETECTED NOT DETECTED Final   Streptococcus agalactiae NOT DETECTED NOT DETECTED Final   Streptococcus pneumoniae NOT DETECTED NOT DETECTED Final   Streptococcus pyogenes NOT DETECTED NOT DETECTED Final   A.calcoaceticus-baumannii NOT DETECTED NOT DETECTED Final   Bacteroides fragilis NOT DETECTED NOT DETECTED Final   Enterobacterales DETECTED (A) NOT DETECTED Final    Comment: Enterobacterales represent a large order of gram negative bacteria, not a single organism. CRITICAL RESULT CALLED TO, READ BACK BY AND VERIFIED WITH: MORGAN CUNNINGHAMM AT [place] 01/28/20 MJU/SDR    Enterobacter cloacae complex NOT DETECTED NOT DETECTED Final   Escherichia coli DETECTED (A) NOT DETECTED Final    Comment: CRITICAL RESULT CALLED TO, READ BACK BY AND VERIFIED WITH: MORGAN CUNNINGHAM @1652  01/28/20 MJU    Klebsiella aerogenes NOT DETECTED NOT DETECTED Final   Klebsiella oxytoca NOT DETECTED NOT DETECTED Final   Klebsiella pneumoniae NOT DETECTED NOT DETECTED Final   Proteus species NOT DETECTED NOT DETECTED Final   Salmonella species NOT DETECTED NOT DETECTED Final   Serratia marcescens NOT  DETECTED NOT DETECTED Final   Haemophilus influenzae NOT DETECTED NOT DETECTED Final   Neisseria meningitidis NOT DETECTED NOT DETECTED Final   Pseudomonas aeruginosa NOT DETECTED NOT DETECTED Final   Stenotrophomonas maltophilia NOT DETECTED NOT DETECTED Final   Candida albicans NOT DETECTED NOT DETECTED Final   Candida auris NOT DETECTED NOT DETECTED Final   Candida glabrata NOT DETECTED NOT DETECTED Final   Candida krusei NOT DETECTED NOT DETECTED Final   Candida parapsilosis NOT DETECTED NOT DETECTED Final   Candida tropicalis NOT DETECTED NOT DETECTED Final   Cryptococcus neoformans/gattii NOT DETECTED NOT DETECTED Final   CTX-M ESBL NOT DETECTED NOT DETECTED Final   Carbapenem resistance IMP NOT DETECTED NOT DETECTED Final   Carbapenem resistance KPC NOT DETECTED NOT DETECTED Final   Carbapenem resistance NDM NOT DETECTED NOT DETECTED Final   Carbapenem resist OXA 48 LIKE NOT DETECTED NOT DETECTED Final   Carbapenem resistance VIM NOT DETECTED NOT DETECTED Final    Comment: Performed at [facility], [address] [place], [place] [address]  Culture, blood (Routine x 2)     Status: None   Collection Time: 01/28/20  5:09 AM   Specimen: BLOOD  Result Value Ref Range Status   Specimen Description BLOOD RIGHT ANTECUBITAL  Final   Special Requests   Final    BOTTLES DRAWN AEROBIC AND ANAEROBIC Blood Culture results may not be optimal due to an excessive volume of blood received in culture bottles   Culture   Final    NO GROWTH 5 DAYS Performed at [facility], [address] [place], [place] [address]    Report Status 02/02/2020 FINAL  Final  SARS Coronavirus 2 by RT PCR (hospital order, performed in [facility] hospital lab) Nasopharyngeal Nasopharyngeal Swab  Status: None   Collection Time: 01/28/20  5:09 AM   Specimen: Nasopharyngeal Swab  Result Value Ref Range Status   SARS Coronavirus 2 NEGATIVE NEGATIVE Final    Comment: (NOTE) SARS-CoV-2 target  nucleic acids are NOT DETECTED.  The SARS-CoV-2 RNA is generally detectable in upper and lower respiratory specimens during the acute phase of infection. The lowest concentration of SARS-CoV-2 viral copies this assay can detect is 250 copies / mL. A negative result does not preclude SARS-CoV-2 infection and should not be used as the sole basis for treatment or other patient management decisions.  A negative result may occur with improper specimen collection / handling, submission of specimen other than nasopharyngeal swab, presence of viral mutation(s) within the areas targeted by this assay, and inadequate number of viral copies (<250 copies / mL). A negative result must be combined with clinical observations, patient history, and epidemiological information.  Fact Sheet for Patients:   StrictlyIdeas.no  Fact Sheet for Healthcare Providers: BankingDealers.co.za  This test is not yet approved or  cleared by the [place] FDA and has been authorized for detection and/or diagnosis of SARS-CoV-2 by FDA under an Emergency Use Authorization (EUA).  This EUA will remain in effect (meaning this test can be used) for the duration of the COVID-19 declaration under Section 564(b)(1) of the Act, 21 U.S.C. section 360bbb-3(b)(1), unless the authorization is terminated or revoked sooner.  Performed at [facility], [address] [place], [place] [address]   Urine Culture     Status: Abnormal   Collection Time: 01/28/20  5:09 AM   Specimen: Urine, Random  Result Value Ref Range Status   Specimen Description   Final    URINE, RANDOM Performed at [facility], [address] [place], [place] [address]    Special Requests   Final    Normal Performed at [facility], [place], [place] [address]    Culture >=100,000 COLONIES/mL ESCHERICHIA COLI (A)  Final   Report Status 01/30/2020 FINAL  Final    Organism ID, Bacteria ESCHERICHIA COLI (A)  Final      Susceptibility   Escherichia coli - MIC*    AMPICILLIN <=2 SENSITIVE Sensitive     CEFAZOLIN <=4 SENSITIVE Sensitive     CEFTRIAXONE <=0.25 SENSITIVE Sensitive     CIPROFLOXACIN <=0.25 SENSITIVE Sensitive     GENTAMICIN <=1 SENSITIVE Sensitive     IMIPENEM <=0.25 SENSITIVE Sensitive     NITROFURANTOIN <=16 SENSITIVE Sensitive     TRIMETH/SULFA <=20 SENSITIVE Sensitive     AMPICILLIN/SULBACTAM <=2 SENSITIVE Sensitive     PIP/TAZO <=4 SENSITIVE Sensitive     * >=100,000 COLONIES/mL ESCHERICHIA COLI  C Difficile Quick Screen w PCR reflex     Status: Abnormal   Collection Time: 01/28/20  6:31 PM   Specimen: STOOL  Result Value Ref Range Status   C Diff antigen POSITIVE (A) NEGATIVE Final   C Diff toxin NEGATIVE NEGATIVE Final   C Diff interpretation Results are indeterminate. See PCR results.  Final    Comment: Performed at [facility], [address] [place],  [address]  Gastrointestinal Panel by PCR , Stool     Status: None   Collection Time: 01/28/20  6:31 PM   Specimen: STOOL  Result Value Ref Range Status   Campylobacter species NOT DETECTED NOT DETECTED Final   Plesimonas shigelloides NOT DETECTED NOT DETECTED Final   Salmonella species NOT DETECTED NOT DETECTED Final   Yersinia  enterocolitica NOT DETECTED NOT DETECTED Final   Vibrio species NOT DETECTED NOT DETECTED Final   Vibrio cholerae NOT DETECTED NOT DETECTED Final   Enteroaggregative E coli (EAEC) NOT DETECTED NOT DETECTED Final   Enteropathogenic E coli (EPEC) NOT DETECTED NOT DETECTED Final   Enterotoxigenic E coli (ETEC) NOT DETECTED NOT DETECTED Final   Shiga like toxin producing E coli (STEC) NOT DETECTED NOT DETECTED Final   Shigella/Enteroinvasive E coli (EIEC) NOT DETECTED NOT DETECTED Final   Cryptosporidium NOT DETECTED NOT DETECTED Final   Cyclospora cayetanensis NOT DETECTED NOT DETECTED Final   Entamoeba histolytica NOT DETECTED  NOT DETECTED Final   Giardia lamblia NOT DETECTED NOT DETECTED Final   Adenovirus F40/41 NOT DETECTED NOT DETECTED Final   Astrovirus NOT DETECTED NOT DETECTED Final   Norovirus GI/GII NOT DETECTED NOT DETECTED Final   Rotavirus A NOT DETECTED NOT DETECTED Final   Sapovirus (I, II, IV, and V) NOT DETECTED NOT DETECTED Final    Comment: Performed at [facility], [address] [place], [place] [address]  C. Diff by PCR, Reflexed     Status: None   Collection Time: 01/28/20  6:31 PM  Result Value Ref Range Status   Toxigenic C. Difficile by PCR NEGATIVE NEGATIVE Final    Comment: Patient is colonized with non toxigenic C. difficile. May not need treatment unless significant symptoms are present. Performed at [facility], [address], [place], [place] [address]      Discharge Instructions:   Discharge Instructions    DME Bedside commode   Complete by: As directed    CHF, debility, history of stroke   Patient needs a bedside commode to treat with the following condition: Debility   Diet - low sodium heart healthy   Complete by: As directed    Increase activity slowly   Complete by: As directed      Allergies as of 02/02/2020   No Known Allergies     Medication List    STOP taking these medications   diclofenac Sodium 1 % Gel Commonly known as: VOLTAREN     TAKE these medications   acetaminophen 325 MG tablet Commonly known as: TYLENOL Take 1-2 tablets (325-650 mg total) by mouth every 4 (four) hours as needed for mild pain.   allopurinol 300 MG tablet Commonly known as: ZYLOPRIM Take 1 tablet (300 mg total) by mouth daily.   apixaban 5 MG Tabs tablet Commonly known as: Eliquis Take 1 tablet (5 mg total) by mouth 2 (two) times daily.   aspirin 81 MG EC tablet Take 1 tablet (81 mg total) by mouth daily.   atorvastatin 80 MG tablet Commonly known as: LIPITOR Take 1 tablet (80 mg total) by mouth every evening.   colchicine 0.6 MG  tablet Take 1 tablet (0.6 mg total) by mouth daily as needed (gout flare).   cyclobenzaprine 5 MG tablet Commonly known as: FLEXERIL Take 1 tablet (5 mg total) by mouth 3 (three) times daily as needed for muscle spasms.   diltiazem 240 MG 24 hr capsule Commonly known as: Cartia XT Take 1 capsule (240 mg total) by mouth daily. Start taking on: February 03, 2020   gabapentin 400 MG capsule Commonly known as: NEURONTIN Take 400 mg by mouth 3 (three) times daily.   latanoprost 0.005 % ophthalmic solution Commonly known as: XALATAN Place 1 drop into both eyes at bedtime.   levofloxacin 500 MG tablet Commonly known as: LEVAQUIN Take 1 tablet (500 mg  total) by mouth daily for 1 day. Start taking on: February 03, 2020   losartan 100 MG tablet Commonly known as: COZAAR Take 50 mg by mouth daily.   metoprolol tartrate 50 MG tablet Commonly known as: LOPRESSOR Take 1 tablet (50 mg total) by mouth 2 (two) times daily. What changed:   medication strength  how much to take   nicotine 7 mg/24hr patch Commonly known as: NICODERM CQ - dosed in mg/24 hr Place 1 patch (7 mg total) onto the skin daily.            Durable Medical Equipment  (From admission, onward)         Start     Ordered   01/30/20 1253  For home use only DME Bedside commode  Once       Question:  Patient needs a bedside commode to treat with the following condition  Answer:  Weakness   01/30/20 1252   01/30/20 0000  DME Bedside commode       Comments: CHF, debility, history of stroke  Question:  Patient needs a bedside commode to treat with the following condition  Answer:  Debility   01/30/20 1738          Contact information for after-discharge care    [facility] .   Service: Skilled Nursing Contact information: [place] [phone]                   Time coordinating discharge: 33  minutes.  Signed:  Jennye Boroughs  [facility] 02/02/2020, 5:16 PM   Pager on [email or web address]. If 7PM-7AM, please contact night-coverage at [email or web address]

## 2020-02-03 LAB — SARS CORONAVIRUS 2 BY RT PCR (HOSPITAL ORDER, PERFORMED IN ~~LOC~~ HOSPITAL LAB): SARS Coronavirus 2: NEGATIVE

## 2020-02-03 NOTE — TOC Transition Note (Addendum)
Transition of Care [facility]) - CM/SW Discharge Note   Patient Details  Name: [name] MRN: [id number] Date of Birth: 1950-08-28  Transition of [facility]) CM/SW Contact:  Harriet Masson, RN Phone Number: [phone] 02/03/2020, 8:34 AM   Clinical Narrative:    Received requested on pt is ready for discharge to the facility. Spoke with [facility] Tanya who requested a more recent co-vid test which was completed and reported this morning as negative. Also requested discharge summary.  Spoke with both the patient and his friend [name]) as requested with updates on pending discharge today to [facility] for rehabilitation.   TOC will continue to follow.  AddendumLavella Lemons confirmed transport.  EMS called for transport to [facility] room #34. Team is aware. No further needs at this time.    Barriers to Discharge: Continued Medical Work up   Patient Goals and CMS Choice   CMS Medicare.gov Compare Post Acute Care list provided to:: Patient Choice offered to / list presented to : Patient  Discharge Placement                       Discharge Plan and Services                                     Social Determinants of Health (SDOH) Interventions     Readmission Risk Interventions Readmission Risk Prevention Plan 01/29/2020  Transportation Screening Complete  Palliative Care Screening Not Applicable  Medication Review (RN Care Manager) Complete  Some recent data might be hidden

## 2020-02-03 NOTE — Progress Notes (Signed)
[name]  A and O x 4 VSS. Pt tolerating diet well. No complaints of pain or nausea. IV removed intact, prescriptions given. Pt voices understanding of discharge instructions with no further questions. Pt discharged to facility via ambulance.   Allergies as of 02/03/2020   No Known Allergies     Medication List    STOP taking these medications   diclofenac Sodium 1 % Gel Commonly known as: VOLTAREN     TAKE these medications   acetaminophen 325 MG tablet Commonly known as: TYLENOL Take 1-2 tablets (325-650 mg total) by mouth every 4 (four) hours as needed for mild pain.   allopurinol 300 MG tablet Commonly known as: ZYLOPRIM Take 1 tablet (300 mg total) by mouth daily.   apixaban 5 MG Tabs tablet Commonly known as: Eliquis Take 1 tablet (5 mg total) by mouth 2 (two) times daily.   aspirin 81 MG EC tablet Take 1 tablet (81 mg total) by mouth daily.   atorvastatin 80 MG tablet Commonly known as: LIPITOR Take 1 tablet (80 mg total) by mouth every evening.   colchicine 0.6 MG tablet Take 1 tablet (0.6 mg total) by mouth daily as needed (gout flare).   cyclobenzaprine 5 MG tablet Commonly known as: FLEXERIL Take 1 tablet (5 mg total) by mouth 3 (three) times daily as needed for muscle spasms.   diltiazem 240 MG 24 hr capsule Commonly known as: Cartia XT Take 1 capsule (240 mg total) by mouth daily.   gabapentin 400 MG capsule Commonly known as: NEURONTIN Take 400 mg by mouth 3 (three) times daily.   latanoprost 0.005 % ophthalmic solution Commonly known as: XALATAN Place 1 drop into both eyes at bedtime.   losartan 100 MG tablet Commonly known as: COZAAR Take 50 mg by mouth daily.   metoprolol tartrate 50 MG tablet Commonly known as: LOPRESSOR Take 1 tablet (50 mg total) by mouth 2 (two) times daily. What changed:   medication strength  how much to take   nicotine 7 mg/24hr patch Commonly known as: NICODERM CQ - dosed in mg/24 hr Place 1 patch (7  mg total) onto the skin daily.            Durable Medical Equipment  (From admission, onward)         Start     Ordered   01/30/20 1253  For home use only DME Bedside commode  Once       Question:  Patient needs a bedside commode to treat with the following condition  Answer:  Weakness   01/30/20 1252          Vitals:   02/03/20 0413 02/03/20 0828  BP: (!) 135/94 135/88  Pulse: 73 77  Resp: 19   Temp: 98 F (36.7 C)   SpO2: 100%     [name]

## 2020-02-03 NOTE — Discharge Summary (Addendum)
Physician Discharge Summary  [name] HCW:[id number] DOB: 1950-11-18 DOA: 01/28/2020  PCP: Theotis Burrow, MD  Admit date: 01/28/2020 Discharge date: 02/03/2020  Discharge disposition: Skilled nursing facility   Recommendations for Outpatient Follow-Up:   Follow-up with physician at the nursing home within 3 days of discharge   Discharge Diagnosis:   Principal Problem:   UTI (urinary tract infection) Active Problems:   Cerebral infarction [facility])   DVT (deep venous thrombosis) (HCC)   Essential hypertension   Gout   Pulmonary embolism (HCC)   Chronic a-fib (HCC)   Severe sepsis (HCC)   Tobacco abuse   Elevated troponin   Hypokalemia   Diarrhea   Chest [name]   Chronic combined systolic (congestive) and diastolic (congestive) heart failure (HCC)   Acute metabolic encephalopathy    Discharge Condition: Stable.  Diet recommendation:  Diet Order            Diet - low sodium heart healthy           Diet Heart Room service appropriate? Yes; Fluid consistency: Thin  Diet effective now                   Code Status: Full Code     Hospital Course:   Mr. Mark Drumwrightis a 69 y.o.malewith medical history significant ofhypertension, hyperlipidemia, diet-controlled diabetes, stroke, gout, PVD, varicose vein legs, atrial fibrillation, PE/DVT on Eliquis, left renal infarct, tobacco abuse, CHF with EF of 40-45%, who presented to the hospital with confusion, fever, chills, chest [name] and diarrhea.  According to his girlfriend, patient has had some confusion at baseline since he had a stroke.  However, his confusion had worsened.  He was admitted to the hospital for severe sepsis secondary to UTI.  He was treated with IV fluids and empiric IV antibiotics.  Urine culture and blood culture revealed E. coli consistent with E. coli sepsis secondary to E. coli bacteremia and UTI.  He was initially treated with IV Rocephin but this was switched to oral  Levaquin.  He had intermittent episodes of hallucination and worsening confusion during this hospitalization which was attributed to delirium.  He also developed atrial fibrillation with rapid ventricular response.  His metoprolol was increased from 12.5 mg twice daily to 50 mg twice daily and oral Cardizem was added for adequate rate control.  Heart rate has improved.  He was evaluated by PT and OT who recommended further rehabilitation at a skilled nursing facility.  Overall, his condition has improved and he is deemed stable for discharge to SNF today.  Discharge plan was discussed with the patient and his girlfriend, Ms. [name]. Ms. [name] confirmed that patient has had some confusion post stroke (in April 2021) and she is concerned that patient may be developing dementia.  Of note, CT of the head was done twice during this hospitalization, there was no evidence of acute stroke.     Discharge Exam:    Vitals:   02/02/20 1000 02/02/20 1925 02/03/20 0413 02/03/20 0828  BP: 140/86 129/68 (!) 135/94 135/88  Pulse:  72 73 77  Resp:   19   Temp:  98 F (36.7 C) 98 F (36.7 C)   TempSrc:  Oral Oral   SpO2:  99% 100%   Weight:      Height:         GEN: NAD SKIN: No rash EYES: EOMI ENT: MMM CV: RRR PULM: CTA B ABD: soft, ND, NT, +BS CNS: AAO x 2 (person  and place), non focal EXT: No edema or tenderness   The results of significant diagnostics from this hospitalization (including imaging, microbiology, ancillary and laboratory) are listed below for reference.     Procedures and Diagnostic Studies:   CT HEAD WO CONTRAST  Result Date: 01/28/2020 CLINICAL DATA:  Mental status change with unknown cause EXAM: CT HEAD WITHOUT CONTRAST TECHNIQUE: Contiguous axial images were obtained from the base of the skull through the vertex without intravenous contrast. COMPARISON:  09/13/2019 FINDINGS: Brain: The right PCA territory infarct at the occipital lobe has evolved as expected  since prior. Remote left occipital and left frontal infarcts as previously seen. Small remote right superior cerebellar infarct. No visible acute infarct, hemorrhage, hydrocephalus, or collection. Vascular: No hyperdense vessel or unexpected calcification. Skull: Hyperostosis interna Sinuses/Orbits: No acute finding IMPRESSION: 1. No acute finding. 2. Remote bilateral PCA and left MCA branch infarcts. Electronically Signed   By: Monte Fantasia M.D.   On: 01/28/2020 10:09   DG Chest Port 1 View  Result Date: 01/28/2020 CLINICAL DATA:  Confusion. EXAM: PORTABLE CHEST 1 VIEW COMPARISON:  04/08/2019 FINDINGS: The heart is enlarged but stable. Prominent mediastinal and hilar contours are unchanged. Moderate tortuosity of the thoracic aorta. Chronic lung changes but no definite acute overlying pulmonary process. Evidence of remote trauma involving the right shoulder area. No acute bony findings. IMPRESSION: Chronic lung changes but no acute pulmonary findings. Electronically Signed   By: Marijo Sanes M.D.   On: 01/28/2020 05:33   CT Renal Stone Study  Result Date: 01/28/2020 CLINICAL DATA:  UTI, sepsis.  Rule out kidney stone. EXAM: CT ABDOMEN AND PELVIS WITHOUT CONTRAST TECHNIQUE: Multidetector CT imaging of the abdomen and pelvis was performed following the standard protocol without IV contrast. COMPARISON:  CT abdomen pelvis 04/07/2019 FINDINGS: Lower chest: Lung bases clear bilaterally. Cardiac enlargement. Enlargement of the right atrium and right ventricle similar to the prior study. Hepatobiliary: Punctate calcification in the inferior right lobe of the liver unchanged from prior study. No liver mass. Gallbladder and bile ducts normal. Pancreas: Negative Spleen: Negative Adrenals/Urinary Tract: Negative for renal hydronephrosis. Chronic infarct left upper renal pole with associated punctate calcification. Right kidney normal. No ureteral or bladder calculus. Mild bladder wall thickening. Stomach/Bowel:  Negative for bowel obstruction. No bowel mass or edema. Appendix nonvisualized. Negative for diverticulitis. Vascular/Lymphatic: Atherosclerotic aorta and iliacs without aneurysm. No adenopathy. IVC filter unchanged in position at the L2-3 level. Reproductive: Prostate enlargement measuring 4.7 by 5.9 cm. Other: No free fluid.  Negative for hernia. Musculoskeletal: No acute skeletal abnormality. Lumbar degenerative changes with severe facet degeneration at L4-5 and L5-S1. IMPRESSION: 1. Negative for obstructing urinary tract calculus. 2. Chronic infarct left upper pole with punctate calcification. No other renal calculi. Electronically Signed   By: Franchot Gallo M.D.   On: 01/28/2020 07:30     Labs:   Basic Metabolic Panel: Recent Labs  Lab 01/28/20 0444 01/28/20 0444 01/29/20 0611 01/29/20 0611 01/30/20 0547 01/30/20 0547 01/31/20 1056 02/02/20 0852  NA 133*  --  135  --  136  --   --  136  K 3.4*   < > 4.0   < > 3.5   < > 3.9 4.1  CL 99  --  101  --  102  --   --  101  CO2 24  --  23  --  25  --   --  22  GLUCOSE 110*  --  108*  --  113*  --   --  108*  BUN 20  --  25*  --  20  --   --  10  CREATININE 1.11  --  0.99  --  0.92  --   --  0.91  CALCIUM 8.6*  --  8.4*  --  8.3*  --   --  8.9  MG  --   --  2.1  --   --   --  1.9 2.1   < > = values in this interval not displayed.   GFR Estimated Creatinine Clearance: 86.6 mL/min (by C-G formula based on SCr of 0.91 mg/dL). Liver Function Tests: Recent Labs  Lab 01/28/20 0444  AST 31  ALT 22  ALKPHOS 123  BILITOT 2.1*  PROT 7.4  ALBUMIN 3.5   No results for input(s): LIPASE, AMYLASE in the last 168 hours. No results for input(s): AMMONIA in the last 168 hours. Coagulation profile Recent Labs  Lab 01/28/20 0444  INR 1.3*    CBC: Recent Labs  Lab 01/28/20 0444 01/29/20 0611 01/30/20 0547 02/02/20 0852  WBC 2.9* 10.9* 8.3 5.5  NEUTROABS 2.5  --   --   --   HGB 11.9* 11.3* 9.8* 11.9*  HCT 35.6* 32.3* 29.0* 37.2*   MCV 75.9* 74.1* 75.3* 78.2*  PLT 133* 118* 139* 228   Cardiac Enzymes: No results for input(s): CKTOTAL, CKMB, CKMBINDEX, TROPONINI in the last 168 hours. BNP: Invalid input(s): POCBNP CBG: No results for input(s): GLUCAP in the last 168 hours. D-Dimer No results for input(s): DDIMER in the last 72 hours. Hgb A1c No results for input(s): HGBA1C in the last 72 hours. Lipid Profile No results for input(s): CHOL, HDL, LDLCALC, TRIG, CHOLHDL, LDLDIRECT in the last 72 hours. Thyroid function studies No results for input(s): TSH, T4TOTAL, T3FREE, THYROIDAB in the last 72 hours.  Invalid input(s): FREET3 Anemia work up No results for input(s): VITAMINB12, FOLATE, FERRITIN, TIBC, IRON, RETICCTPCT in the last 72 hours. Microbiology Recent Results (from the past 240 hour(s))  Culture, blood (Routine x 2)     Status: Abnormal   Collection Time: 01/28/20  4:44 AM   Specimen: BLOOD  Result Value Ref Range Status   Specimen Description   Final    BLOOD LEFT ANTECUBITAL Performed at [facility], [address], [place], [place] [address]    Special Requests   Final    BOTTLES DRAWN AEROBIC AND ANAEROBIC Blood Culture results may not be optimal due to an excessive volume of blood received in culture bottles Performed at [facility], [address] [place], [place] [address]    Culture  Setup Time   Final    ANAEROBIC BOTTLE ONLY GRAM NEGATIVE RODS Organism ID to follow CRITICAL RESULT CALLED TO, READ BACK BY AND VERIFIED WITH: MORGAN CUNNINGHAM @1652  01/28/20 MJU    Culture ESCHERICHIA COLI (A)  Final   Report Status 01/31/2020 FINAL  Final   Organism ID, Bacteria ESCHERICHIA COLI  Final      Susceptibility   Escherichia coli - MIC*    AMPICILLIN <=2 SENSITIVE Sensitive     CEFAZOLIN <=4 SENSITIVE Sensitive     CEFEPIME <=0.12 SENSITIVE Sensitive     CEFTAZIDIME <=1 SENSITIVE Sensitive     CEFTRIAXONE <=0.25 SENSITIVE Sensitive     CIPROFLOXACIN <=0.25  SENSITIVE Sensitive     GENTAMICIN <=1 SENSITIVE Sensitive     IMIPENEM <=0.25 SENSITIVE Sensitive     TRIMETH/SULFA <=20 SENSITIVE Sensitive     AMPICILLIN/SULBACTAM <=2 SENSITIVE Sensitive  PIP/TAZO <=4 SENSITIVE Sensitive     * ESCHERICHIA COLI  Blood Culture ID Panel (Reflexed)     Status: Abnormal   Collection Time: 01/28/20  4:44 AM  Result Value Ref Range Status   Enterococcus faecalis NOT DETECTED NOT DETECTED Final   Enterococcus Faecium NOT DETECTED NOT DETECTED Final   Listeria monocytogenes NOT DETECTED NOT DETECTED Final   Staphylococcus species NOT DETECTED NOT DETECTED Final   Staphylococcus aureus (BCID) NOT DETECTED NOT DETECTED Final   Staphylococcus epidermidis NOT DETECTED NOT DETECTED Final   Staphylococcus lugdunensis NOT DETECTED NOT DETECTED Final   Streptococcus species NOT DETECTED NOT DETECTED Final   Streptococcus agalactiae NOT DETECTED NOT DETECTED Final   Streptococcus pneumoniae NOT DETECTED NOT DETECTED Final   Streptococcus pyogenes NOT DETECTED NOT DETECTED Final   A.calcoaceticus-baumannii NOT DETECTED NOT DETECTED Final   Bacteroides fragilis NOT DETECTED NOT DETECTED Final   Enterobacterales DETECTED (A) NOT DETECTED Final    Comment: Enterobacterales represent a large order of gram negative bacteria, not a single organism. CRITICAL RESULT CALLED TO, READ BACK BY AND VERIFIED WITH: MORGAN CUNNINGHAMM AT [place] 01/28/20 MJU/SDR    Enterobacter cloacae complex NOT DETECTED NOT DETECTED Final   Escherichia coli DETECTED (A) NOT DETECTED Final    Comment: CRITICAL RESULT CALLED TO, READ BACK BY AND VERIFIED WITH: MORGAN CUNNINGHAM @1652  01/28/20 MJU    Klebsiella aerogenes NOT DETECTED NOT DETECTED Final   Klebsiella oxytoca NOT DETECTED NOT DETECTED Final   Klebsiella pneumoniae NOT DETECTED NOT DETECTED Final   Proteus species NOT DETECTED NOT DETECTED Final   Salmonella species NOT DETECTED NOT DETECTED Final   Serratia marcescens NOT DETECTED  NOT DETECTED Final   Haemophilus influenzae NOT DETECTED NOT DETECTED Final   Neisseria meningitidis NOT DETECTED NOT DETECTED Final   Pseudomonas aeruginosa NOT DETECTED NOT DETECTED Final   Stenotrophomonas maltophilia NOT DETECTED NOT DETECTED Final   Candida albicans NOT DETECTED NOT DETECTED Final   Candida auris NOT DETECTED NOT DETECTED Final   Candida glabrata NOT DETECTED NOT DETECTED Final   Candida krusei NOT DETECTED NOT DETECTED Final   Candida parapsilosis NOT DETECTED NOT DETECTED Final   Candida tropicalis NOT DETECTED NOT DETECTED Final   Cryptococcus neoformans/gattii NOT DETECTED NOT DETECTED Final   CTX-M ESBL NOT DETECTED NOT DETECTED Final   Carbapenem resistance IMP NOT DETECTED NOT DETECTED Final   Carbapenem resistance KPC NOT DETECTED NOT DETECTED Final   Carbapenem resistance NDM NOT DETECTED NOT DETECTED Final   Carbapenem resist OXA 48 LIKE NOT DETECTED NOT DETECTED Final   Carbapenem resistance VIM NOT DETECTED NOT DETECTED Final    Comment: Performed at [facility], [address] [place], [place] [address]  Culture, blood (Routine x 2)     Status: None   Collection Time: 01/28/20  5:09 AM   Specimen: BLOOD  Result Value Ref Range Status   Specimen Description BLOOD RIGHT ANTECUBITAL  Final   Special Requests   Final    BOTTLES DRAWN AEROBIC AND ANAEROBIC Blood Culture results may not be optimal due to an excessive volume of blood received in culture bottles   Culture   Final    NO GROWTH 5 DAYS Performed at [facility], [address] [place], [place] [address]    Report Status 02/02/2020 FINAL  Final  SARS Coronavirus 2 by RT PCR (hospital order, performed in [place] hospital lab) Nasopharyngeal Nasopharyngeal Swab     Status: None   Collection Time:  01/28/20  5:09 AM   Specimen: Nasopharyngeal Swab  Result Value Ref Range Status   SARS Coronavirus 2 NEGATIVE NEGATIVE Final    Comment: (NOTE) SARS-CoV-2 target nucleic  acids are NOT DETECTED.  The SARS-CoV-2 RNA is generally detectable in upper and lower respiratory specimens during the acute phase of infection. The lowest concentration of SARS-CoV-2 viral copies this assay can detect is 250 copies / mL. A negative result does not preclude SARS-CoV-2 infection and should not be used as the sole basis for treatment or other patient management decisions.  A negative result may occur with improper specimen collection / handling, submission of specimen other than nasopharyngeal swab, presence of viral mutation(s) within the areas targeted by this assay, and inadequate number of viral copies (<250 copies / mL). A negative result must be combined with clinical observations, patient history, and epidemiological information.  Fact Sheet for Patients:   StrictlyIdeas.no  Fact Sheet for Healthcare Providers: BankingDealers.co.za  This test is not yet approved or  cleared by the [place] FDA and has been authorized for detection and/or diagnosis of SARS-CoV-2 by FDA under an Emergency Use Authorization (EUA).  This EUA will remain in effect (meaning this test can be used) for the duration of the COVID-19 declaration under Section 564(b)(1) of the Act, 21 U.S.C. section 360bbb-3(b)(1), unless the authorization is terminated or revoked sooner.  Performed at [facility], [address] [place], [place] [address]   Urine Culture     Status: Abnormal   Collection Time: 01/28/20  5:09 AM   Specimen: Urine, Random  Result Value Ref Range Status   Specimen Description   Final    URINE, RANDOM Performed at [facility], [address] [place], [place] [address]    Special Requests   Final    Normal Performed at [facility], [place], [place] [address]    Culture >=100,000 COLONIES/mL ESCHERICHIA COLI (A)  Final   Report Status 01/30/2020 FINAL  Final    Organism ID, Bacteria ESCHERICHIA COLI (A)  Final      Susceptibility   Escherichia coli - MIC*    AMPICILLIN <=2 SENSITIVE Sensitive     CEFAZOLIN <=4 SENSITIVE Sensitive     CEFTRIAXONE <=0.25 SENSITIVE Sensitive     CIPROFLOXACIN <=0.25 SENSITIVE Sensitive     GENTAMICIN <=1 SENSITIVE Sensitive     IMIPENEM <=0.25 SENSITIVE Sensitive     NITROFURANTOIN <=16 SENSITIVE Sensitive     TRIMETH/SULFA <=20 SENSITIVE Sensitive     AMPICILLIN/SULBACTAM <=2 SENSITIVE Sensitive     PIP/TAZO <=4 SENSITIVE Sensitive     * >=100,000 COLONIES/mL ESCHERICHIA COLI  C Difficile Quick Screen w PCR reflex     Status: Abnormal   Collection Time: 01/28/20  6:31 PM   Specimen: STOOL  Result Value Ref Range Status   C Diff antigen POSITIVE (A) NEGATIVE Final   C Diff toxin NEGATIVE NEGATIVE Final   C Diff interpretation Results are indeterminate. See PCR results.  Final    Comment: Performed at [facility], [address] [place], [place] [address]  Gastrointestinal Panel by PCR , Stool     Status: None   Collection Time: 01/28/20  6:31 PM   Specimen: STOOL  Result Value Ref Range Status   Campylobacter species NOT DETECTED NOT DETECTED Final   Plesimonas shigelloides NOT DETECTED NOT DETECTED Final   Salmonella species NOT DETECTED NOT DETECTED Final   Yersinia enterocolitica NOT DETECTED NOT DETECTED Final  Vibrio species NOT DETECTED NOT DETECTED Final   Vibrio cholerae NOT DETECTED NOT DETECTED Final   Enteroaggregative E coli (EAEC) NOT DETECTED NOT DETECTED Final   Enteropathogenic E coli (EPEC) NOT DETECTED NOT DETECTED Final   Enterotoxigenic E coli (ETEC) NOT DETECTED NOT DETECTED Final   Shiga like toxin producing E coli (STEC) NOT DETECTED NOT DETECTED Final   Shigella/Enteroinvasive E coli (EIEC) NOT DETECTED NOT DETECTED Final   Cryptosporidium NOT DETECTED NOT DETECTED Final   Cyclospora cayetanensis NOT DETECTED NOT DETECTED Final   Entamoeba histolytica NOT DETECTED NOT  DETECTED Final   Giardia lamblia NOT DETECTED NOT DETECTED Final   Adenovirus F40/41 NOT DETECTED NOT DETECTED Final   Astrovirus NOT DETECTED NOT DETECTED Final   Norovirus GI/GII NOT DETECTED NOT DETECTED Final   Rotavirus A NOT DETECTED NOT DETECTED Final   Sapovirus (I, II, IV, and V) NOT DETECTED NOT DETECTED Final    Comment: Performed at [facility], [address] [place], [place] [address]  C. Diff by PCR, Reflexed     Status: None   Collection Time: 01/28/20  6:31 PM  Result Value Ref Range Status   Toxigenic C. Difficile by PCR NEGATIVE NEGATIVE Final    Comment: Patient is colonized with non toxigenic C. difficile. May not need treatment unless significant symptoms are present. Performed at [facility], [address] [place], [place] [address]   SARS Coronavirus 2 by RT PCR (hospital order, performed in [facility] hospital lab) Nasopharyngeal Nasopharyngeal Swab     Status: None   Collection Time: 02/02/20  4:00 AM   Specimen: Nasopharyngeal Swab  Result Value Ref Range Status   SARS Coronavirus 2 NEGATIVE NEGATIVE Final    Comment: (NOTE) SARS-CoV-2 target nucleic acids are NOT DETECTED.  The SARS-CoV-2 RNA is generally detectable in upper and lower respiratory specimens during the acute phase of infection. The lowest concentration of SARS-CoV-2 viral copies this assay can detect is 250 copies / mL. A negative result does not preclude SARS-CoV-2 infection and should not be used as the sole basis for treatment or other patient management decisions.  A negative result may occur with improper specimen collection / handling, submission of specimen other than nasopharyngeal swab, presence of viral mutation(s) within the areas targeted by this assay, and inadequate number of viral copies (<250 copies / mL). A negative result must be combined with clinical observations, patient history, and epidemiological information.  Fact Sheet for Patients:    StrictlyIdeas.no  Fact Sheet for Healthcare Providers: BankingDealers.co.za  This test is not yet approved or  cleared by the [place] FDA and has been authorized for detection and/or diagnosis of SARS-CoV-2 by FDA under an Emergency Use Authorization (EUA).  This EUA will remain in effect (meaning this test can be used) for the duration of the COVID-19 declaration under Section 564(b)(1) of the Act, 21 U.S.C. section 360bbb-3(b)(1), unless the authorization is terminated or revoked sooner.  Performed at [facility], [address] [place], [place] [address]      Discharge Instructions:   Discharge Instructions    Diet - low sodium heart healthy   Complete by: As directed    Increase activity slowly   Complete by: As directed      Allergies as of 02/03/2020   No Known Allergies     Medication List    STOP taking these medications   diclofenac Sodium 1 % Gel Commonly known as: VOLTAREN  TAKE these medications   acetaminophen 325 MG tablet Commonly known as: TYLENOL Take 1-2 tablets (325-650 mg total) by mouth every 4 (four) hours as needed for mild [name].   allopurinol 300 MG tablet Commonly known as: ZYLOPRIM Take 1 tablet (300 mg total) by mouth daily.   apixaban 5 MG Tabs tablet Commonly known as: Eliquis Take 1 tablet (5 mg total) by mouth 2 (two) times daily.   aspirin 81 MG EC tablet Take 1 tablet (81 mg total) by mouth daily.   atorvastatin 80 MG tablet Commonly known as: LIPITOR Take 1 tablet (80 mg total) by mouth every evening.   colchicine 0.6 MG tablet Take 1 tablet (0.6 mg total) by mouth daily as needed (gout flare).   cyclobenzaprine 5 MG tablet Commonly known as: FLEXERIL Take 1 tablet (5 mg total) by mouth 3 (three) times daily as needed for muscle spasms.   diltiazem 240 MG 24 hr capsule Commonly known as: Cartia XT Take 1 capsule (240 mg total) by mouth daily.    gabapentin 400 MG capsule Commonly known as: NEURONTIN Take 400 mg by mouth 3 (three) times daily.   latanoprost 0.005 % ophthalmic solution Commonly known as: XALATAN Place 1 drop into both eyes at bedtime.   losartan 100 MG tablet Commonly known as: COZAAR Take 50 mg by mouth daily.   metoprolol tartrate 50 MG tablet Commonly known as: LOPRESSOR Take 1 tablet (50 mg total) by mouth 2 (two) times daily. What changed:   medication strength  how much to take   nicotine 7 mg/24hr patch Commonly known as: NICODERM CQ - dosed in mg/24 hr Place 1 patch (7 mg total) onto the skin daily.                          Contact information for after-discharge care    [facility] .   Service: Skilled Nursing Contact information: [place] [phone]                   Time coordinating discharge: 28 minutes  Signed:  Jennye Boroughs  [facility] 02/03/2020, 10:04 AM   Pager on [email or web address]. If 7PM-7AM, please contact night-coverage at [email or web address]

## 2020-02-13 ENCOUNTER — Encounter: Payer: Self-pay | Admitting: Occupational Therapy

## 2020-02-13 DIAGNOSIS — M6281 Muscle weakness (generalized): Secondary | ICD-10-CM

## 2020-02-13 NOTE — Therapy (Signed)
[place] MAIN [facility] SERVICES [address] [place], [place], [address] Phone: [phone]   Fax:  [phone]  February 13, 2020    No Recipients  Occupational Therapy Discharge Summary   Patient: [name] MRN: [id number] Date of Birth: 09-24-1950  Diagnosis: Muscle weakness (generalized)  Referring Provider (OT): [place]   The above patient had been seen in Occupational Therapy 12 times   The treatment consisted of ADL training, A/E training, UE there. Ex. neuromuscular re-education, visual perceptual functioning, and pt. education The patient is: Improved  Discharge Findings: Pt. was seen for 12 OT visits, and had made progress overall. Pt.    Functional Status at Discharge:    OT Long Term Goals - 11/08/19 1026      OT LONG TERM GOAL #1   Title Pt. will improve Bilateral grip strength by 10# to be able to independently open jars, and containers.    Baseline Eval: pt. presents with limited  grip strength, and has difficulty opening jars, and containers. 11/08/19 grip improved but still has diff with managing jars and cont.    Time 12    Period Weeks    Status Partially met   Target Date 12/25/19      OT LONG TERM GOAL #2   Title Pt. will improve bilateral [place] skills by 20 sec. to be able to manipulate items during ADLs, and IADLs.    Baseline Eval: R: 1 min. & 53 sec., and Left: 2 min & 38 sec. to place 8 pegs only., 11/08/19 able to complete 9 hole peg right 1 min 38, left 2 min 31 sec.    Time 12    Period Weeks    Status Not met, however pt had progressed.   Target Date 12/25/19      OT LONG TERM GOAL #3   Title Pt. will increase BUE strength to be able to reach up into cabinetry.    Baseline Eval: BUE strength is limited. 11/08/2019 improved but still working on improved motion    Time 12    Period Weeks    Status Partially met   Target Date 12/25/19      OT LONG TERM GOAL #4   Title Pt. will independently  demonstrate visual compensatory strategies while navigating through his environment during ADLs, and IADLs 100% of the time    Baseline Eval: Pt. does not utilize visual compensatory strategies, 11/08/19 decreased cues and able to demo several strategies.    Time 12    Period Weeks    Status Partially met   Target Date 12/25/19      OT LONG TERM GOAL #5   Title Pt. will demonstrate visual compensatory strategies 100% of the time during tabletop ADL, and IADL tasks    Baseline Eval: pt. does not utilize visual compensatory strategies. 11/08/2019 increased knowledge of strategies.    Time 12    Period Weeks    Status Partially met   Target Date 12/25/19             Sincerely,  Harrel Carina, OT  CC No Recipients  [place] MAIN [facility] SERVICES [address] [place], [place], [address] Phone: [phone]   Fax:  [phone]  Patient: [name] MRN: [id number] Date of Birth: Sep 16, 1950

## 2020-03-06 ENCOUNTER — Emergency Department: Payer: Medicare Other

## 2020-03-06 ENCOUNTER — Encounter: Payer: Self-pay | Admitting: Emergency Medicine

## 2020-03-06 ENCOUNTER — Other Ambulatory Visit: Payer: Self-pay

## 2020-03-06 ENCOUNTER — Ambulatory Visit: Payer: Medicare Other | Admitting: Podiatry

## 2020-03-06 ENCOUNTER — Emergency Department
Admission: EM | Admit: 2020-03-06 | Discharge: 2020-03-06 | Disposition: A | Discharge status: Home / Self Care | Payer: Medicare Other | Attending: Emergency Medicine | Admitting: Emergency Medicine

## 2020-03-06 DIAGNOSIS — Z79899 Other long term (current) drug therapy: Secondary | ICD-10-CM | POA: Insufficient documentation

## 2020-03-06 DIAGNOSIS — E1140 Type 2 diabetes mellitus with diabetic neuropathy, unspecified: Secondary | ICD-10-CM | POA: Insufficient documentation

## 2020-03-06 DIAGNOSIS — Z86718 Personal history of other venous thrombosis and embolism: Secondary | ICD-10-CM | POA: Diagnosis not present

## 2020-03-06 DIAGNOSIS — M109 Gout, unspecified: Secondary | ICD-10-CM | POA: Diagnosis not present

## 2020-03-06 DIAGNOSIS — E1159 Type 2 diabetes mellitus with other circulatory complications: Secondary | ICD-10-CM | POA: Insufficient documentation

## 2020-03-06 DIAGNOSIS — I110 Hypertensive heart disease with heart failure: Secondary | ICD-10-CM | POA: Diagnosis not present

## 2020-03-06 DIAGNOSIS — M25562 Pain in left knee: Secondary | ICD-10-CM | POA: Diagnosis present

## 2020-03-06 DIAGNOSIS — I5042 Chronic combined systolic (congestive) and diastolic (congestive) heart failure: Secondary | ICD-10-CM | POA: Diagnosis not present

## 2020-03-06 DIAGNOSIS — F17210 Nicotine dependence, cigarettes, uncomplicated: Secondary | ICD-10-CM | POA: Diagnosis not present

## 2020-03-06 DIAGNOSIS — Z86711 Personal history of pulmonary embolism: Secondary | ICD-10-CM | POA: Insufficient documentation

## 2020-03-06 DIAGNOSIS — Z7901 Long term (current) use of anticoagulants: Secondary | ICD-10-CM | POA: Insufficient documentation

## 2020-03-06 DIAGNOSIS — Z7982 Long term (current) use of aspirin: Secondary | ICD-10-CM | POA: Diagnosis not present

## 2020-03-06 MED ORDER — COLCHICINE 0.6 MG PO TABS: 0.6000 mg | ORAL_TABLET | Freq: Every day | ORAL | 1 refills | Status: DC

## 2020-03-06 MED ORDER — PREDNISONE 10 MG PO TABS: ORAL_TABLET | ORAL | 0 refills | Status: DC

## 2020-03-06 MED ORDER — COLCHICINE 0.6 MG PO TABS
1.2000 mg | ORAL_TABLET | ORAL | Status: AC
  Administered 2020-03-06: 1.2 mg via ORAL
  Filled 2020-03-06 (×2): qty 2

## 2020-03-06 MED ORDER — PREDNISONE 20 MG PO TABS
40.0000 mg | ORAL_TABLET | ORAL | Status: AC
  Administered 2020-03-06: 40 mg via ORAL
  Filled 2020-03-06: qty 2

## 2020-03-06 NOTE — Discharge Instructions (Signed)
As we discussed, most likely your pain is coming from a gout flare.  We gave you a higher dose of colchicine in the emergency department and recommend that you take another dose of 0.6 mg of colchicine this morning and continue to take it once daily until you see your regular doctor or an orthopedic surgeon in follow up or until your pain resolves.  I also gave you a prescription for prednisone that I recommend you take as recommended for the full course of treatment along with your other medications.  Please call the orthopedics office this morning to schedule the next available follow-up appointment.  You may additionally want to follow-up with your regular doctor.  Return to the emergency department if you develop new or worsening symptoms that concern you.
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
## 2020-03-06 NOTE — ED Triage Notes (Signed)
Patient with complaint of left knee pain times two days. Patient denies injury. Patient states that he has had to have fluid removed from his knee before and that this feels the same.

## 2020-04-26 ENCOUNTER — Other Ambulatory Visit: Payer: Self-pay | Admitting: Physical Medicine and Rehabilitation
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
## 2020-05-01 ENCOUNTER — Encounter (INDEPENDENT_AMBULATORY_CARE_PROVIDER_SITE_OTHER): Payer: Medicare Other

## 2020-05-01 ENCOUNTER — Ambulatory Visit (INDEPENDENT_AMBULATORY_CARE_PROVIDER_SITE_OTHER): Payer: Medicare Other | Admitting: Vascular Surgery

## 2020-05-21 ENCOUNTER — Encounter: Payer: Self-pay | Admitting: Urology

## 2020-05-21 ENCOUNTER — Ambulatory Visit (INDEPENDENT_AMBULATORY_CARE_PROVIDER_SITE_OTHER): Payer: Medicare Other | Admitting: Urology

## 2020-05-21 ENCOUNTER — Other Ambulatory Visit: Payer: Self-pay

## 2020-05-21 VITALS — BP 147/88 | HR 74 | Ht 74.0 in | Wt 225.0 lb

## 2020-05-21 DIAGNOSIS — R6889 Other general symptoms and signs: Secondary | ICD-10-CM | POA: Diagnosis not present

## 2020-05-21 DIAGNOSIS — Z87440 Personal history of urinary (tract) infections: Secondary | ICD-10-CM

## 2020-05-21 DIAGNOSIS — R972 Elevated prostate specific antigen [PSA]: Secondary | ICD-10-CM | POA: Diagnosis not present

## 2020-05-21 LAB — BLADDER SCAN AMB NON-IMAGING: Scan Result: 138

## 2020-05-21 NOTE — Patient Instructions (Signed)
Prostate Biopsy Instructions ° °Stop all aspirin or blood thinners (aspirin, plavix, coumadin, warfarin, motrin, ibuprofen, advil, aleve, naproxen, naprosyn) for 7 days prior to the procedure.  If you have any questions about stopping these medications, please contact your primary care physician or cardiologist. ° °Having a light meal prior to the procedure is recommended.  If you are diabetic or have low blood sugar please bring a small snack or glucose tablet. ° °A Fleets enema is needed to be purchased over the counter at a local pharmacy and used 2 hours before you scheduled appointment.  This can be purchased over the counter at any pharmacy. ° °Antibiotics will be administered in the clinic at the time of the procedure unless otherwise specified.   ° °Please bring someone with you to the procedure to drive you home. ° °A follow up appointment has been scheduled for you to receive the results of the biopsy. ° °If you have any questions or concerns, please feel free to call the office at [phone] or send a Mychart message. ° ° ° °Thank you, °Staff at [facility]  °
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
## 2020-05-22 LAB — PSA: Prostate Specific Ag, Serum: 104 ng/mL — ABNORMAL HIGH (ref 0.0–4.0)

## 2020-06-02 ENCOUNTER — Ambulatory Visit: Payer: Self-pay

## 2020-06-03 ENCOUNTER — Ambulatory Visit (INDEPENDENT_AMBULATORY_CARE_PROVIDER_SITE_OTHER): Payer: Medicare Other | Admitting: Urology

## 2020-06-03 ENCOUNTER — Other Ambulatory Visit: Payer: Self-pay

## 2020-06-03 DIAGNOSIS — R972 Elevated prostate specific antigen [PSA]: Secondary | ICD-10-CM

## 2020-06-03 LAB — BLADDER SCAN AMB NON-IMAGING: Scan Result: 66

## 2020-06-03 NOTE — Progress Notes (Signed)
Patient presents today for a UA, PVR. Ua was collected and PVR was performed. The residual for the PVR is 17ml.

## 2020-06-04 ENCOUNTER — Telehealth: Payer: Self-pay | Admitting: *Deleted

## 2020-06-04 LAB — URINALYSIS, COMPLETE
Bilirubin, UA: NEGATIVE
Glucose, UA: NEGATIVE
Ketones, UA: NEGATIVE
Leukocytes,UA: NEGATIVE
Nitrite, UA: NEGATIVE
Specific Gravity, UA: 1.02 (ref 1.005–1.030)
Urobilinogen, Ur: 0.2 mg/dL (ref 0.2–1.0)
pH, UA: 7 (ref 5.0–7.5)

## 2020-06-04 LAB — MICROSCOPIC EXAMINATION: Bacteria, UA: NONE SEEN

## 2020-06-04 NOTE — Telephone Encounter (Signed)
Called patient to inform of Cardiac clearance received, voiced understanding to Hold Eliquis for 7 days prior to Biopsy

## 2020-06-10 ENCOUNTER — Other Ambulatory Visit: Payer: Self-pay

## 2020-06-10 ENCOUNTER — Encounter: Payer: Self-pay | Admitting: Urology

## 2020-06-10 ENCOUNTER — Ambulatory Visit (INDEPENDENT_AMBULATORY_CARE_PROVIDER_SITE_OTHER): Payer: Medicare Other | Admitting: Urology

## 2020-06-10 VITALS — BP 168/91 | HR 91 | Ht 74.0 in | Wt 218.0 lb

## 2020-06-10 DIAGNOSIS — R972 Elevated prostate specific antigen [PSA]: Secondary | ICD-10-CM | POA: Diagnosis not present

## 2020-06-10 MED ORDER — LEVOFLOXACIN 500 MG PO TABS
500.0000 mg | ORAL_TABLET | Freq: Once | ORAL | Status: AC
  Administered 2020-06-10: 500 mg via ORAL

## 2020-06-10 MED ORDER — GENTAMICIN SULFATE 40 MG/ML IJ SOLN
80.0000 mg | Freq: Once | INTRAMUSCULAR | Status: AC
  Administered 2020-06-10: 80 mg via INTRAMUSCULAR

## 2020-06-10 NOTE — Progress Notes (Signed)
   06/10/20  CC:  Chief Complaint  Patient presents with  . Prostate Biopsy    HPI: 70 year old with markedly elevated PSA and abnormal rectal exam who presents today for prostate biopsy.  Please see previous notes for details.  NED. A&Ox3.   No respiratory distress   Abd soft, NT, ND Normal sphincter tone  Prostate Biopsy Procedure   Informed consent was obtained after discussing risks/benefits of the procedure.  A time out was performed to ensure correct patient identity.  Pre-Procedure: - Gentamicin given prophylactically - Levaquin 500 mg administered PO -Transrectal Ultrasound performed revealing a 52.4 gm prostate -Transrectal ultrasound was grossly abnormal with multiple hypoechoic areas particularly on the left which is highly concerning.  He also had dilated seminal vesicles bilaterally.  Procedure: - Prostate block performed using 10 cc 1% lidocaine and biopsies taken from sextant areas, a total of 12 under ultrasound guidance.  Post-Procedure: - Patient tolerated the procedure well - He was counseled to seek immediate medical attention if experiences any severe pain, significant bleeding, or fevers  - Return in one week to discuss biopsy results; he is aware that based on his PSA and very suspicious exam and ultrasound today, there is a good chance he has prostate cancer.  If he does not fact have prostate cancer, we may call him prior to his follow-up and schedule staging imaging.    Hollice Espy, MD

## 2020-06-12 LAB — SURGICAL PATHOLOGY

## 2020-06-13 ENCOUNTER — Telehealth: Payer: Self-pay | Admitting: *Deleted

## 2020-06-13 DIAGNOSIS — C61 Malignant neoplasm of prostate: Secondary | ICD-10-CM

## 2020-06-13 DIAGNOSIS — R972 Elevated prostate specific antigen [PSA]: Secondary | ICD-10-CM

## 2020-06-13 NOTE — Telephone Encounter (Addendum)
Patient informed, voiced understanding. Placed order for PET scan and CT ABD. Rescheduled follow up.  Patient verbalizes understanding.     ----- Message from Hollice Espy, MD sent at 06/13/2020  9:36 AM EST ----- I mention to the patient that if the pathology is consistent with prostate cancer which I had fully anticipated, we would probably want to get staging imaging before he returns for his visit so that we can better frame our discussion and management options.  Prostate biopsy did in fact show prostate cancer.  I would like him to get a CT abdomen pelvis with contrast as well as bone scan before his follow-up.  He is scheduled to follow-up next week so this may need to be get pushed out.  Please order and reschedule.  Hollice Espy, MD

## 2020-06-16 ENCOUNTER — Ambulatory Visit: Payer: Self-pay

## 2020-06-17 ENCOUNTER — Ambulatory Visit: Payer: Self-pay | Admitting: Urology

## 2020-06-17 ENCOUNTER — Other Ambulatory Visit: Payer: Self-pay | Admitting: Urology

## 2020-06-24 ENCOUNTER — Ambulatory Visit: Payer: Self-pay | Admitting: Urology

## 2020-06-30 ENCOUNTER — Ambulatory Visit
Admission: RE | Admit: 2020-06-30 | Discharge: 2020-06-30 | Disposition: A | Discharge status: Home / Self Care | Payer: Medicare Other | Source: Ambulatory Visit | Attending: Urology | Admitting: Urology

## 2020-06-30 ENCOUNTER — Other Ambulatory Visit: Payer: Self-pay

## 2020-06-30 DIAGNOSIS — C61 Malignant neoplasm of prostate: Secondary | ICD-10-CM | POA: Insufficient documentation

## 2020-06-30 LAB — POCT I-STAT CREATININE: Creatinine, Ser: 1 mg/dL (ref 0.61–1.24)

## 2020-06-30 MED ORDER — TECHNETIUM TC 99M MEDRONATE IV KIT
20.0000 | PACK | Freq: Once | INTRAVENOUS | Status: AC | PRN
  Administered 2020-06-30: 21.642 via INTRAVENOUS

## 2020-06-30 MED ORDER — IOHEXOL 300 MG/ML  SOLN
100.0000 mL | Freq: Once | INTRAMUSCULAR | Status: AC | PRN
  Administered 2020-06-30: 100 mL via INTRAVENOUS

## 2020-07-01 ENCOUNTER — Ambulatory Visit (INDEPENDENT_AMBULATORY_CARE_PROVIDER_SITE_OTHER): Payer: Medicare Other | Admitting: Urology

## 2020-07-01 VITALS — BP 162/91 | HR 80

## 2020-07-01 DIAGNOSIS — C61 Malignant neoplasm of prostate: Secondary | ICD-10-CM

## 2020-07-01 NOTE — Patient Instructions (Signed)
Leuprolide depot injection What is this medicine? LEUPROLIDE (loo PROE lide) is a man-made protein that acts like a natural hormone in the body. It decreases testosterone in men and decreases estrogen in women. In men, this medicine is used to treat advanced prostate cancer. In women, some forms of this medicine may be used to treat endometriosis, uterine fibroids, or other male hormone-related problems. This medicine may be used for other purposes; ask your health care provider or pharmacist if you have questions. COMMON BRAND NAME(S): Eligard, Fensolv, Lupron Depot, Lupron Depot-Ped, Viadur What should I tell my health care provider before I take this medicine? They need to know if you have any of these conditions:  diabetes  heart disease or previous heart attack  high blood pressure  high cholesterol  mental illness  osteoporosis  pain or difficulty passing urine  seizures  spinal cord metastasis  stroke  suicidal thoughts, plans, or attempt; a previous suicide attempt by you or a family member  tobacco smoker  unusual vaginal bleeding (women)  an unusual or allergic reaction to leuprolide, benzyl alcohol, other medicines, foods, dyes, or preservatives  pregnant or trying to get pregnant  breast-feeding How should I use this medicine? This medicine is for injection into a muscle or for injection under the skin. It is given by a health care professional in a hospital or clinic setting. The specific product will determine how it will be given to you. Make sure you understand which product you receive and how often you will receive it. Talk to your pediatrician regarding the use of this medicine in children. Special care may be needed. Overdosage: If you think you have taken too much of this medicine contact a poison control center or emergency room at once. NOTE: This medicine is only for you. Do not share this medicine with others. What if I miss a dose? It is  important not to miss a dose. Call your doctor or health care professional if you are unable to keep an appointment. Depot injections: Depot injections are given either once-monthly, every 12 weeks, every 16 weeks, or every 24 weeks depending on the product you are prescribed. The product you are prescribed will be based on if you are male or male, and your condition. Make sure you understand your product and dosing. What may interact with this medicine? Do not take this medicine with any of the following medications:  chasteberry  cisapride  dronedarone  pimozide  thioridazine This medicine may also interact with the following medications:  herbal or dietary supplements, like black cohosh or DHEA  male hormones, like estrogens or progestins and birth control pills, patches, rings, or injections  male hormones, like testosterone  other medicines that prolong the QT interval (abnormal heart rhythm) This list may not describe all possible interactions. Give your health care provider a list of all the medicines, herbs, non-prescription drugs, or dietary supplements you use. Also tell them if you smoke, drink alcohol, or use illegal drugs. Some items may interact with your medicine. What should I watch for while using this medicine? Visit your doctor or health care professional for regular checks on your progress. During the first weeks of treatment, your symptoms may get worse, but then will improve as you continue your treatment. You may get hot flashes, increased bone pain, increased difficulty passing urine, or an aggravation of nerve symptoms. Discuss these effects with your doctor or health care professional, some of them may improve with continued use of this  medicine. Male patients may experience a menstrual cycle or spotting during the first months of therapy with this medicine. If this continues, contact your doctor or health care professional. This medicine may increase blood  sugar. Ask your healthcare provider if changes in diet or medicines are needed if you have diabetes. What side effects may I notice from receiving this medicine? Side effects that you should report to your doctor or health care professional as soon as possible:  allergic reactions like skin rash, itching or hives, swelling of the face, lips, or tongue  breathing problems  chest pain  depression or memory disorders  pain in your legs or groin  pain at site where injected or implanted  seizures  severe headache  signs and symptoms of high blood sugar such as being more thirsty or hungry or having to urinate more than normal. You may also feel very tired or have blurry vision  swelling of the feet and legs  suicidal thoughts or other mood changes  visual changes  vomiting Side effects that usually do not require medical attention (report to your doctor or health care professional if they continue or are bothersome):  breast swelling or tenderness  decrease in sex drive or performance  diarrhea  hot flashes  loss of appetite  muscle, joint, or bone pains  nausea  redness or irritation at site where injected or implanted  skin problems or acne This list may not describe all possible side effects. Call your doctor for medical advice about side effects. You may report side effects to FDA at 1-800-FDA-1088. Where should I keep my medicine? This drug is given in a hospital or clinic and will not be stored at home. NOTE: This sheet is a summary. It may not cover all possible information. If you have questions about this medicine, talk to your doctor, pharmacist, or health care provider.  2021 Elsevier/Gold Standard (2019-04-04 10:35:13)
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
## 2020-07-08 ENCOUNTER — Telehealth: Payer: Self-pay

## 2020-07-08 NOTE — Telephone Encounter (Signed)
-----   Message from Alvera Novel, [place] sent at 07/01/2020  4:11 PM EST ----- Regarding: Eligard Pt is scheduled for Eligard on 07/23/2020 with Dr. Erlene Quan

## 2020-07-08 NOTE — Telephone Encounter (Signed)
Benefits eligibility faxed for Lupron/Eliagrd. Awaiting response.

## 2020-07-09 ENCOUNTER — Ambulatory Visit
Admission: RE | Admit: 2020-07-09 | Discharge: 2020-07-09 | Disposition: A | Discharge status: Home / Self Care | Payer: Medicare Other | Source: Ambulatory Visit | Attending: Radiation Oncology | Admitting: Radiation Oncology

## 2020-07-09 ENCOUNTER — Other Ambulatory Visit: Payer: Self-pay

## 2020-07-09 VITALS — BP 155/89 | HR 61 | Temp 96.2°F | Wt 225.0 lb

## 2020-07-09 DIAGNOSIS — C61 Malignant neoplasm of prostate: Secondary | ICD-10-CM
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
## 2020-07-10 ENCOUNTER — Telehealth: Payer: Self-pay | Admitting: Urology

## 2020-07-10 ENCOUNTER — Telehealth: Payer: Self-pay | Admitting: Licensed Clinical Social Worker

## 2020-07-10 NOTE — Telephone Encounter (Signed)
Patient notified that he has a CT sim appt on 07/25/2020 at 10am. Patient verbalized understanding

## 2020-07-10 NOTE — Telephone Encounter (Signed)
-----   Message from [place] sent at 07/09/2020  1:39 PM EST ----- Patient will need Eliguard injection and markers placed. Please let me know when the appt is scheduled. Thank you!

## 2020-07-10 NOTE — Telephone Encounter (Signed)
Patient has been scheduled  Clearance to stop his ASA and eliquis are in his chart Patient was a little confused about the instructions, having the CMS call him to speak with him again.  Sharyn Lull

## 2020-07-10 NOTE — Telephone Encounter (Signed)
Spoke with patients friend [name] ( on [place]) gave her specific instructions to stop eliquis, ASA 7 days prior to procedure, enema 2 hours priors to procedure, and to bring gold seeds. She verbalized understanding.

## 2020-07-11 ENCOUNTER — Telehealth: Payer: Self-pay

## 2020-07-11 NOTE — Telephone Encounter (Signed)
[facility] PA obtained for Shiela Mayer # [id number], Dates : 07/11/20- 07/11/21.

## 2020-07-23 ENCOUNTER — Other Ambulatory Visit: Payer: Self-pay

## 2020-07-23 ENCOUNTER — Ambulatory Visit (INDEPENDENT_AMBULATORY_CARE_PROVIDER_SITE_OTHER): Payer: Medicare Other | Admitting: Urology

## 2020-07-23 ENCOUNTER — Encounter: Payer: Self-pay | Admitting: Urology

## 2020-07-23 VITALS — BP 156/80 | HR 60 | Ht 74.0 in | Wt 225.0 lb

## 2020-07-23 DIAGNOSIS — C61 Malignant neoplasm of prostate: Secondary | ICD-10-CM | POA: Diagnosis not present

## 2020-07-23 MED ORDER — GENTAMICIN SULFATE 40 MG/ML IJ SOLN
80.0000 mg | Freq: Once | INTRAMUSCULAR | Status: AC
  Administered 2020-07-23: 80 mg via INTRAMUSCULAR

## 2020-07-23 MED ORDER — LEUPROLIDE ACETATE (6 MONTH) 45 MG ~~LOC~~ KIT
45.0000 mg | PACK | Freq: Once | SUBCUTANEOUS | Status: AC
  Administered 2020-07-23: 45 mg via SUBCUTANEOUS

## 2020-07-23 MED ORDER — LEVOFLOXACIN 500 MG PO TABS
500.0000 mg | ORAL_TABLET | Freq: Once | ORAL | Status: AC
  Administered 2020-07-23: 500 mg via ORAL

## 2020-07-23 NOTE — Progress Notes (Signed)
Eligard SubQ Injection   Due to Prostate Cancer patient is present today for a Eligard Injection.  Medication: Eligard 6 month Dose: 45 mg  Location: right  Lot: [id number] Exp: 10/2021  Patient tolerated well, no complications were noted  Performed by: Fonnie Jarvis, CMA  This is a one time dose of ADT. Patient give a reminder today to continue on Vitamin D 800-1000iu and Calium 1000-1200mg  daily while on Androgen Deprivation Therapy.  PA approval dates:  [facility] PA obtained for Shiela Mayer # [id number], Dates : 07/11/20- 07/11/21.

## 2020-07-23 NOTE — Progress Notes (Signed)
07/23/20  CC: gold seed markers  HPI: 70 y.o. male with prostate cancer who presents today for placement of fiducial seed markers in anticipation of his upcoming IMRT with Dr. Baruch Gouty.  He is also scheduled to receive his adjuvant leuprolide today for which she is previously counseled.  Prostate Gold Seed Marker Placement Procedure   Informed consent was obtained after discussing risks/benefits of the procedure.  A time out was performed to ensure correct patient identity.  Pre-Procedure: - Gentamicin given prophylactically - PO Levaquin 500 mg also given today  Procedure: -Lidocaine jelly was administered per rectum -Rectal ultrasound probe was placed without difficulty and the prostate visualized - 3 fiducial gold seed markers placed, one at right base, one at left base, one at apex of prostate gland under transrectal ultrasound guidance  Post-Procedure: - Patient tolerated the procedure well - He was counseled to seek immediate medical attention if experiences any severe pain, significant bleeding, or fevers  F/u 6 months for PSA  Hollice Espy, MD

## 2020-07-25 ENCOUNTER — Ambulatory Visit
Admission: RE | Admit: 2020-07-25 | Discharge: 2020-07-25 | Disposition: A | Discharge status: Home / Self Care | Payer: Medicare Other | Source: Ambulatory Visit | Attending: Radiation Oncology | Admitting: Radiation Oncology

## 2020-07-25 DIAGNOSIS — C775 Secondary and unspecified malignant neoplasm of intrapelvic lymph nodes: Secondary | ICD-10-CM | POA: Diagnosis not present

## 2020-07-25 DIAGNOSIS — C61 Malignant neoplasm of prostate: Secondary | ICD-10-CM | POA: Diagnosis present

## 2020-07-25 DIAGNOSIS — Z510 Encounter for antineoplastic radiation therapy: Secondary | ICD-10-CM | POA: Insufficient documentation

## 2020-07-31 DIAGNOSIS — Z510 Encounter for antineoplastic radiation therapy: Secondary | ICD-10-CM | POA: Diagnosis not present

## 2020-08-01 ENCOUNTER — Other Ambulatory Visit: Payer: Self-pay | Admitting: *Deleted

## 2020-08-01 DIAGNOSIS — C61 Malignant neoplasm of prostate: Secondary | ICD-10-CM

## 2020-08-04 ENCOUNTER — Inpatient Hospital Stay: Payer: Medicare Other | Attending: Radiation Oncology

## 2020-08-04 ENCOUNTER — Ambulatory Visit: Admission: RE | Admit: 2020-08-04 | Payer: Medicare Other | Source: Ambulatory Visit

## 2020-08-05 ENCOUNTER — Inpatient Hospital Stay: Payer: Medicare Other

## 2020-08-05 ENCOUNTER — Ambulatory Visit
Admission: RE | Admit: 2020-08-05 | Discharge: 2020-08-05 | Disposition: A | Discharge status: Home / Self Care | Payer: Medicare Other | Source: Ambulatory Visit | Attending: Radiation Oncology | Admitting: Radiation Oncology

## 2020-08-05 DIAGNOSIS — Z510 Encounter for antineoplastic radiation therapy: Secondary | ICD-10-CM | POA: Diagnosis not present

## 2020-08-06 ENCOUNTER — Other Ambulatory Visit: Payer: Self-pay

## 2020-08-06 ENCOUNTER — Ambulatory Visit
Admission: RE | Admit: 2020-08-06 | Discharge: 2020-08-06 | Disposition: A | Discharge status: Home / Self Care | Payer: Medicare Other | Source: Ambulatory Visit | Attending: Radiation Oncology | Admitting: Radiation Oncology

## 2020-08-06 ENCOUNTER — Inpatient Hospital Stay: Payer: Medicare Other

## 2020-08-06 DIAGNOSIS — Z510 Encounter for antineoplastic radiation therapy: Secondary | ICD-10-CM | POA: Diagnosis not present

## 2020-08-07 ENCOUNTER — Inpatient Hospital Stay: Payer: Medicare Other

## 2020-08-07 ENCOUNTER — Ambulatory Visit
Admission: RE | Admit: 2020-08-07 | Discharge: 2020-08-07 | Disposition: A | Discharge status: Home / Self Care | Payer: Medicare Other | Source: Ambulatory Visit | Attending: Radiation Oncology | Admitting: Radiation Oncology

## 2020-08-07 DIAGNOSIS — Z510 Encounter for antineoplastic radiation therapy: Secondary | ICD-10-CM | POA: Diagnosis not present

## 2020-08-08 ENCOUNTER — Ambulatory Visit
Admission: RE | Admit: 2020-08-08 | Discharge: 2020-08-08 | Disposition: A | Discharge status: Home / Self Care | Payer: Medicare Other | Source: Ambulatory Visit | Attending: Radiation Oncology | Admitting: Radiation Oncology

## 2020-08-08 ENCOUNTER — Inpatient Hospital Stay: Payer: Medicare Other

## 2020-08-08 DIAGNOSIS — Z510 Encounter for antineoplastic radiation therapy: Secondary | ICD-10-CM | POA: Diagnosis not present

## 2020-08-11 ENCOUNTER — Inpatient Hospital Stay: Payer: Medicare Other

## 2020-08-11 ENCOUNTER — Ambulatory Visit
Admission: RE | Admit: 2020-08-11 | Discharge: 2020-08-11 | Disposition: A | Discharge status: Home / Self Care | Payer: Medicare Other | Source: Ambulatory Visit | Attending: Radiation Oncology | Admitting: Radiation Oncology

## 2020-08-11 DIAGNOSIS — Z510 Encounter for antineoplastic radiation therapy: Secondary | ICD-10-CM | POA: Diagnosis not present

## 2020-08-12 ENCOUNTER — Inpatient Hospital Stay: Payer: Medicare Other

## 2020-08-12 ENCOUNTER — Ambulatory Visit
Admission: RE | Admit: 2020-08-12 | Discharge: 2020-08-12 | Disposition: A | Discharge status: Home / Self Care | Payer: Medicare Other | Source: Ambulatory Visit | Attending: Radiation Oncology | Admitting: Radiation Oncology

## 2020-08-12 DIAGNOSIS — Z510 Encounter for antineoplastic radiation therapy: Secondary | ICD-10-CM | POA: Diagnosis not present

## 2020-08-13 ENCOUNTER — Ambulatory Visit
Admission: RE | Admit: 2020-08-13 | Discharge: 2020-08-13 | Disposition: A | Discharge status: Home / Self Care | Payer: Medicare Other | Source: Ambulatory Visit | Attending: Radiation Oncology | Admitting: Radiation Oncology

## 2020-08-13 ENCOUNTER — Inpatient Hospital Stay: Payer: Medicare Other

## 2020-08-13 DIAGNOSIS — Z510 Encounter for antineoplastic radiation therapy: Secondary | ICD-10-CM | POA: Diagnosis not present

## 2020-08-14 ENCOUNTER — Inpatient Hospital Stay: Payer: Medicare Other

## 2020-08-14 ENCOUNTER — Ambulatory Visit
Admission: RE | Admit: 2020-08-14 | Discharge: 2020-08-14 | Disposition: A | Discharge status: Home / Self Care | Payer: Medicare Other | Source: Ambulatory Visit | Attending: Radiation Oncology | Admitting: Radiation Oncology

## 2020-08-14 DIAGNOSIS — Z510 Encounter for antineoplastic radiation therapy: Secondary | ICD-10-CM | POA: Diagnosis not present

## 2020-08-15 ENCOUNTER — Ambulatory Visit
Admission: RE | Admit: 2020-08-15 | Discharge: 2020-08-15 | Disposition: A | Discharge status: Home / Self Care | Payer: Medicare Other | Source: Ambulatory Visit | Attending: Radiation Oncology | Admitting: Radiation Oncology

## 2020-08-15 ENCOUNTER — Other Ambulatory Visit: Payer: Self-pay

## 2020-08-15 ENCOUNTER — Inpatient Hospital Stay: Payer: Medicare Other | Attending: Radiation Oncology

## 2020-08-15 DIAGNOSIS — C61 Malignant neoplasm of prostate: Secondary | ICD-10-CM | POA: Insufficient documentation

## 2020-08-15 DIAGNOSIS — C775 Secondary and unspecified malignant neoplasm of intrapelvic lymph nodes: Secondary | ICD-10-CM | POA: Diagnosis not present

## 2020-08-15 DIAGNOSIS — Z510 Encounter for antineoplastic radiation therapy: Secondary | ICD-10-CM | POA: Diagnosis present

## 2020-08-18 ENCOUNTER — Other Ambulatory Visit: Payer: Self-pay

## 2020-08-18 ENCOUNTER — Ambulatory Visit
Admission: RE | Admit: 2020-08-18 | Discharge: 2020-08-18 | Disposition: A | Discharge status: Home / Self Care | Payer: Medicare Other | Source: Ambulatory Visit | Attending: Radiation Oncology | Admitting: Radiation Oncology

## 2020-08-18 ENCOUNTER — Inpatient Hospital Stay: Payer: Medicare Other

## 2020-08-18 DIAGNOSIS — Z510 Encounter for antineoplastic radiation therapy: Secondary | ICD-10-CM | POA: Diagnosis not present

## 2020-08-19 ENCOUNTER — Inpatient Hospital Stay: Payer: Medicare Other

## 2020-08-19 ENCOUNTER — Ambulatory Visit
Admission: RE | Admit: 2020-08-19 | Discharge: 2020-08-19 | Disposition: A | Discharge status: Home / Self Care | Payer: Medicare Other | Source: Ambulatory Visit | Attending: Radiation Oncology | Admitting: Radiation Oncology

## 2020-08-19 DIAGNOSIS — Z510 Encounter for antineoplastic radiation therapy: Secondary | ICD-10-CM | POA: Diagnosis not present

## 2020-08-20 ENCOUNTER — Inpatient Hospital Stay: Payer: Medicare Other

## 2020-08-20 ENCOUNTER — Other Ambulatory Visit: Payer: Self-pay

## 2020-08-20 ENCOUNTER — Ambulatory Visit
Admission: RE | Admit: 2020-08-20 | Discharge: 2020-08-20 | Disposition: A | Discharge status: Home / Self Care | Payer: Medicare Other | Source: Ambulatory Visit | Attending: Radiation Oncology | Admitting: Radiation Oncology

## 2020-08-20 DIAGNOSIS — Z510 Encounter for antineoplastic radiation therapy: Secondary | ICD-10-CM | POA: Diagnosis not present

## 2020-08-21 ENCOUNTER — Ambulatory Visit
Admission: RE | Admit: 2020-08-21 | Discharge: 2020-08-21 | Disposition: A | Discharge status: Home / Self Care | Payer: Medicare Other | Source: Ambulatory Visit | Attending: Radiation Oncology | Admitting: Radiation Oncology

## 2020-08-21 ENCOUNTER — Inpatient Hospital Stay: Payer: Medicare Other

## 2020-08-21 DIAGNOSIS — Z510 Encounter for antineoplastic radiation therapy: Secondary | ICD-10-CM | POA: Diagnosis not present

## 2020-08-22 ENCOUNTER — Other Ambulatory Visit: Payer: Self-pay

## 2020-08-22 ENCOUNTER — Inpatient Hospital Stay: Payer: Medicare Other

## 2020-08-22 ENCOUNTER — Ambulatory Visit
Admission: RE | Admit: 2020-08-22 | Discharge: 2020-08-22 | Disposition: A | Discharge status: Home / Self Care | Payer: Medicare Other | Source: Ambulatory Visit | Attending: Radiation Oncology | Admitting: Radiation Oncology

## 2020-08-22 DIAGNOSIS — Z510 Encounter for antineoplastic radiation therapy: Secondary | ICD-10-CM | POA: Diagnosis not present

## 2020-08-25 ENCOUNTER — Inpatient Hospital Stay: Payer: Medicare Other

## 2020-08-25 ENCOUNTER — Ambulatory Visit
Admission: RE | Admit: 2020-08-25 | Discharge: 2020-08-25 | Disposition: A | Discharge status: Home / Self Care | Payer: Medicare Other | Source: Ambulatory Visit | Attending: Radiation Oncology | Admitting: Radiation Oncology

## 2020-08-25 DIAGNOSIS — Z510 Encounter for antineoplastic radiation therapy: Secondary | ICD-10-CM | POA: Diagnosis not present

## 2020-08-26 ENCOUNTER — Inpatient Hospital Stay: Payer: Medicare Other

## 2020-08-26 ENCOUNTER — Ambulatory Visit
Admission: RE | Admit: 2020-08-26 | Discharge: 2020-08-26 | Disposition: A | Discharge status: Home / Self Care | Payer: Medicare Other | Source: Ambulatory Visit | Attending: Radiation Oncology | Admitting: Radiation Oncology

## 2020-08-26 DIAGNOSIS — Z510 Encounter for antineoplastic radiation therapy: Secondary | ICD-10-CM | POA: Diagnosis not present

## 2020-08-27 ENCOUNTER — Inpatient Hospital Stay: Payer: Medicare Other

## 2020-08-27 ENCOUNTER — Ambulatory Visit
Admission: RE | Admit: 2020-08-27 | Discharge: 2020-08-27 | Disposition: A | Discharge status: Home / Self Care | Payer: Medicare Other | Source: Ambulatory Visit | Attending: Radiation Oncology | Admitting: Radiation Oncology

## 2020-08-27 DIAGNOSIS — Z510 Encounter for antineoplastic radiation therapy: Secondary | ICD-10-CM | POA: Diagnosis not present

## 2020-08-28 ENCOUNTER — Ambulatory Visit
Admission: RE | Admit: 2020-08-28 | Discharge: 2020-08-28 | Disposition: A | Discharge status: Home / Self Care | Payer: Medicare Other | Source: Ambulatory Visit | Attending: Radiation Oncology | Admitting: Radiation Oncology

## 2020-08-28 ENCOUNTER — Inpatient Hospital Stay: Payer: Medicare Other

## 2020-08-28 DIAGNOSIS — Z510 Encounter for antineoplastic radiation therapy: Secondary | ICD-10-CM | POA: Diagnosis not present

## 2020-08-29 ENCOUNTER — Ambulatory Visit
Admission: RE | Admit: 2020-08-29 | Discharge: 2020-08-29 | Disposition: A | Discharge status: Home / Self Care | Payer: Medicare Other | Source: Ambulatory Visit | Attending: Radiation Oncology | Admitting: Radiation Oncology

## 2020-08-29 ENCOUNTER — Inpatient Hospital Stay: Payer: Medicare Other

## 2020-08-29 DIAGNOSIS — Z510 Encounter for antineoplastic radiation therapy: Secondary | ICD-10-CM | POA: Diagnosis not present

## 2020-09-01 ENCOUNTER — Inpatient Hospital Stay: Payer: Medicare Other

## 2020-09-01 ENCOUNTER — Other Ambulatory Visit: Payer: Self-pay

## 2020-09-01 ENCOUNTER — Ambulatory Visit
Admission: RE | Admit: 2020-09-01 | Discharge: 2020-09-01 | Disposition: A | Discharge status: Home / Self Care | Payer: Medicare Other | Source: Ambulatory Visit | Attending: Radiation Oncology | Admitting: Radiation Oncology

## 2020-09-01 DIAGNOSIS — Z510 Encounter for antineoplastic radiation therapy: Secondary | ICD-10-CM | POA: Diagnosis not present

## 2020-09-01 DIAGNOSIS — C61 Malignant neoplasm of prostate: Secondary | ICD-10-CM

## 2020-09-01 LAB — CBC
HCT: 39 % (ref 39.0–52.0)
Hemoglobin: 12.4 g/dL — ABNORMAL LOW (ref 13.0–17.0)
MCH: 25.2 pg — ABNORMAL LOW (ref 26.0–34.0)
MCHC: 31.8 g/dL (ref 30.0–36.0)
MCV: 79.1 fL — ABNORMAL LOW (ref 80.0–100.0)
Platelets: 108 10*3/uL — ABNORMAL LOW (ref 150–400)
RBC: 4.93 MIL/uL (ref 4.22–5.81)
RDW: 17.7 % — ABNORMAL HIGH (ref 11.5–15.5)
WBC: 2.3 10*3/uL — ABNORMAL LOW (ref 4.0–10.5)
nRBC: 0 % (ref 0.0–0.2)

## 2020-09-02 ENCOUNTER — Ambulatory Visit
Admission: RE | Admit: 2020-09-02 | Discharge: 2020-09-02 | Disposition: A | Discharge status: Home / Self Care | Payer: Medicare Other | Source: Ambulatory Visit | Attending: Radiation Oncology | Admitting: Radiation Oncology

## 2020-09-02 ENCOUNTER — Inpatient Hospital Stay: Payer: Medicare Other

## 2020-09-02 DIAGNOSIS — Z510 Encounter for antineoplastic radiation therapy: Secondary | ICD-10-CM | POA: Diagnosis not present

## 2020-09-03 ENCOUNTER — Inpatient Hospital Stay: Payer: Medicare Other

## 2020-09-03 ENCOUNTER — Ambulatory Visit
Admission: RE | Admit: 2020-09-03 | Discharge: 2020-09-03 | Disposition: A | Discharge status: Home / Self Care | Payer: Medicare Other | Source: Ambulatory Visit | Attending: Radiation Oncology | Admitting: Radiation Oncology

## 2020-09-03 DIAGNOSIS — Z510 Encounter for antineoplastic radiation therapy: Secondary | ICD-10-CM | POA: Diagnosis not present

## 2020-09-04 ENCOUNTER — Inpatient Hospital Stay: Payer: Medicare Other

## 2020-09-04 ENCOUNTER — Ambulatory Visit
Admission: RE | Admit: 2020-09-04 | Discharge: 2020-09-04 | Disposition: A | Discharge status: Home / Self Care | Payer: Medicare Other | Source: Ambulatory Visit | Attending: Radiation Oncology | Admitting: Radiation Oncology

## 2020-09-04 DIAGNOSIS — Z510 Encounter for antineoplastic radiation therapy: Secondary | ICD-10-CM | POA: Diagnosis not present

## 2020-09-05 ENCOUNTER — Inpatient Hospital Stay: Payer: Medicare Other

## 2020-09-05 ENCOUNTER — Other Ambulatory Visit: Payer: Self-pay

## 2020-09-05 ENCOUNTER — Ambulatory Visit
Admission: RE | Admit: 2020-09-05 | Discharge: 2020-09-05 | Disposition: A | Discharge status: Home / Self Care | Payer: Medicare Other | Source: Ambulatory Visit | Attending: Radiation Oncology | Admitting: Radiation Oncology

## 2020-09-05 DIAGNOSIS — Z510 Encounter for antineoplastic radiation therapy: Secondary | ICD-10-CM | POA: Diagnosis not present

## 2020-09-08 ENCOUNTER — Inpatient Hospital Stay: Payer: Medicare Other

## 2020-09-08 ENCOUNTER — Ambulatory Visit
Admission: RE | Admit: 2020-09-08 | Discharge: 2020-09-08 | Disposition: A | Discharge status: Home / Self Care | Payer: Medicare Other | Source: Ambulatory Visit | Attending: Radiation Oncology | Admitting: Radiation Oncology

## 2020-09-08 DIAGNOSIS — Z510 Encounter for antineoplastic radiation therapy: Secondary | ICD-10-CM | POA: Diagnosis not present

## 2020-09-09 ENCOUNTER — Inpatient Hospital Stay: Payer: Medicare Other

## 2020-09-09 ENCOUNTER — Ambulatory Visit
Admission: RE | Admit: 2020-09-09 | Discharge: 2020-09-09 | Disposition: A | Discharge status: Home / Self Care | Payer: Medicare Other | Source: Ambulatory Visit | Attending: Radiation Oncology | Admitting: Radiation Oncology

## 2020-09-09 DIAGNOSIS — Z510 Encounter for antineoplastic radiation therapy: Secondary | ICD-10-CM | POA: Diagnosis not present

## 2020-09-10 ENCOUNTER — Ambulatory Visit
Admission: RE | Admit: 2020-09-10 | Discharge: 2020-09-10 | Disposition: A | Discharge status: Home / Self Care | Payer: Medicare Other | Source: Ambulatory Visit | Attending: Radiation Oncology | Admitting: Radiation Oncology

## 2020-09-10 ENCOUNTER — Other Ambulatory Visit: Payer: Self-pay

## 2020-09-10 ENCOUNTER — Inpatient Hospital Stay: Payer: Medicare Other

## 2020-09-10 DIAGNOSIS — Z510 Encounter for antineoplastic radiation therapy: Secondary | ICD-10-CM | POA: Diagnosis not present

## 2020-09-11 ENCOUNTER — Inpatient Hospital Stay: Payer: Medicare Other

## 2020-09-11 ENCOUNTER — Ambulatory Visit
Admission: RE | Admit: 2020-09-11 | Discharge: 2020-09-11 | Disposition: A | Discharge status: Home / Self Care | Payer: Medicare Other | Source: Ambulatory Visit | Attending: Radiation Oncology | Admitting: Radiation Oncology

## 2020-09-11 DIAGNOSIS — Z510 Encounter for antineoplastic radiation therapy: Secondary | ICD-10-CM | POA: Diagnosis not present

## 2020-09-12 ENCOUNTER — Ambulatory Visit
Admission: RE | Admit: 2020-09-12 | Discharge: 2020-09-12 | Disposition: A | Discharge status: Home / Self Care | Payer: Medicare Other | Source: Ambulatory Visit | Attending: Radiation Oncology | Admitting: Radiation Oncology

## 2020-09-12 ENCOUNTER — Inpatient Hospital Stay: Payer: Medicare Other

## 2020-09-12 ENCOUNTER — Other Ambulatory Visit: Payer: Self-pay

## 2020-09-12 DIAGNOSIS — Z510 Encounter for antineoplastic radiation therapy: Secondary | ICD-10-CM | POA: Diagnosis not present

## 2020-09-15 ENCOUNTER — Other Ambulatory Visit: Payer: Self-pay

## 2020-09-15 ENCOUNTER — Ambulatory Visit
Admission: RE | Admit: 2020-09-15 | Discharge: 2020-09-15 | Disposition: A | Discharge status: Home / Self Care | Payer: Medicare Other | Source: Ambulatory Visit | Attending: Radiation Oncology | Admitting: Radiation Oncology

## 2020-09-15 ENCOUNTER — Inpatient Hospital Stay: Payer: Medicare Other

## 2020-09-15 ENCOUNTER — Inpatient Hospital Stay: Payer: Medicare Other | Attending: Radiation Oncology

## 2020-09-15 DIAGNOSIS — C775 Secondary and unspecified malignant neoplasm of intrapelvic lymph nodes: Secondary | ICD-10-CM | POA: Diagnosis not present

## 2020-09-15 DIAGNOSIS — C61 Malignant neoplasm of prostate: Secondary | ICD-10-CM | POA: Insufficient documentation

## 2020-09-15 DIAGNOSIS — Z510 Encounter for antineoplastic radiation therapy: Secondary | ICD-10-CM | POA: Insufficient documentation

## 2020-09-15 LAB — CBC
HCT: 37.2 % — ABNORMAL LOW (ref 39.0–52.0)
Hemoglobin: 11.9 g/dL — ABNORMAL LOW (ref 13.0–17.0)
MCH: 25.2 pg — ABNORMAL LOW (ref 26.0–34.0)
MCHC: 32 g/dL (ref 30.0–36.0)
MCV: 78.6 fL — ABNORMAL LOW (ref 80.0–100.0)
Platelets: 105 10*3/uL — ABNORMAL LOW (ref 150–400)
RBC: 4.73 MIL/uL (ref 4.22–5.81)
RDW: 17.4 % — ABNORMAL HIGH (ref 11.5–15.5)
WBC: 3.2 10*3/uL — ABNORMAL LOW (ref 4.0–10.5)
nRBC: 0 % (ref 0.0–0.2)

## 2020-09-16 ENCOUNTER — Ambulatory Visit
Admission: RE | Admit: 2020-09-16 | Discharge: 2020-09-16 | Disposition: A | Discharge status: Home / Self Care | Payer: Medicare Other | Source: Ambulatory Visit | Attending: Radiation Oncology | Admitting: Radiation Oncology

## 2020-09-16 ENCOUNTER — Other Ambulatory Visit: Payer: Self-pay | Admitting: *Deleted

## 2020-09-16 ENCOUNTER — Inpatient Hospital Stay: Payer: Medicare Other

## 2020-09-16 DIAGNOSIS — Z510 Encounter for antineoplastic radiation therapy: Secondary | ICD-10-CM | POA: Diagnosis not present

## 2020-09-16 MED ORDER — TAMSULOSIN HCL 0.4 MG PO CAPS: 0.4000 mg | ORAL_CAPSULE | Freq: Every day | ORAL | 6 refills | Status: DC

## 2020-09-17 ENCOUNTER — Ambulatory Visit
Admission: RE | Admit: 2020-09-17 | Discharge: 2020-09-17 | Disposition: A | Discharge status: Home / Self Care | Payer: Medicare Other | Source: Ambulatory Visit | Attending: Radiation Oncology | Admitting: Radiation Oncology

## 2020-09-17 ENCOUNTER — Other Ambulatory Visit: Payer: Self-pay

## 2020-09-17 ENCOUNTER — Inpatient Hospital Stay: Payer: Medicare Other

## 2020-09-17 DIAGNOSIS — Z510 Encounter for antineoplastic radiation therapy: Secondary | ICD-10-CM | POA: Diagnosis not present

## 2020-09-18 ENCOUNTER — Ambulatory Visit
Admission: RE | Admit: 2020-09-18 | Discharge: 2020-09-18 | Disposition: A | Discharge status: Home / Self Care | Payer: Medicare Other | Source: Ambulatory Visit | Attending: Radiation Oncology | Admitting: Radiation Oncology

## 2020-09-18 ENCOUNTER — Inpatient Hospital Stay: Payer: Medicare Other

## 2020-09-18 DIAGNOSIS — Z510 Encounter for antineoplastic radiation therapy: Secondary | ICD-10-CM | POA: Diagnosis not present

## 2020-09-19 ENCOUNTER — Ambulatory Visit
Admission: RE | Admit: 2020-09-19 | Discharge: 2020-09-19 | Disposition: A | Discharge status: Home / Self Care | Payer: Medicare Other | Source: Ambulatory Visit | Attending: Radiation Oncology | Admitting: Radiation Oncology

## 2020-09-19 ENCOUNTER — Other Ambulatory Visit: Payer: Self-pay

## 2020-09-19 ENCOUNTER — Inpatient Hospital Stay: Payer: Medicare Other

## 2020-09-19 DIAGNOSIS — Z510 Encounter for antineoplastic radiation therapy: Secondary | ICD-10-CM | POA: Diagnosis not present

## 2020-09-22 ENCOUNTER — Inpatient Hospital Stay: Payer: Medicare Other

## 2020-09-22 ENCOUNTER — Ambulatory Visit
Admission: RE | Admit: 2020-09-22 | Discharge: 2020-09-22 | Disposition: A | Discharge status: Home / Self Care | Payer: Medicare Other | Source: Ambulatory Visit | Attending: Radiation Oncology | Admitting: Radiation Oncology

## 2020-09-22 DIAGNOSIS — Z510 Encounter for antineoplastic radiation therapy: Secondary | ICD-10-CM | POA: Diagnosis not present

## 2020-09-23 ENCOUNTER — Ambulatory Visit
Admission: RE | Admit: 2020-09-23 | Discharge: 2020-09-23 | Disposition: A | Discharge status: Home / Self Care | Payer: Medicare Other | Source: Ambulatory Visit | Attending: Radiation Oncology | Admitting: Radiation Oncology

## 2020-09-23 ENCOUNTER — Inpatient Hospital Stay: Payer: Medicare Other

## 2020-09-23 DIAGNOSIS — Z510 Encounter for antineoplastic radiation therapy: Secondary | ICD-10-CM | POA: Diagnosis not present

## 2020-09-24 ENCOUNTER — Ambulatory Visit
Admission: RE | Admit: 2020-09-24 | Discharge: 2020-09-24 | Disposition: A | Discharge status: Home / Self Care | Payer: Medicare Other | Source: Ambulatory Visit | Attending: Radiation Oncology | Admitting: Radiation Oncology

## 2020-09-24 ENCOUNTER — Inpatient Hospital Stay: Payer: Medicare Other

## 2020-09-24 DIAGNOSIS — Z510 Encounter for antineoplastic radiation therapy: Secondary | ICD-10-CM | POA: Diagnosis not present

## 2020-09-25 ENCOUNTER — Inpatient Hospital Stay: Payer: Medicare Other

## 2020-09-25 ENCOUNTER — Ambulatory Visit
Admission: RE | Admit: 2020-09-25 | Discharge: 2020-09-25 | Disposition: A | Discharge status: Home / Self Care | Payer: Medicare Other | Source: Ambulatory Visit | Attending: Radiation Oncology | Admitting: Radiation Oncology

## 2020-09-25 DIAGNOSIS — Z510 Encounter for antineoplastic radiation therapy: Secondary | ICD-10-CM | POA: Diagnosis not present

## 2020-09-26 ENCOUNTER — Ambulatory Visit
Admission: RE | Admit: 2020-09-26 | Discharge: 2020-09-26 | Disposition: A | Discharge status: Home / Self Care | Payer: Medicare Other | Source: Ambulatory Visit | Attending: Radiation Oncology | Admitting: Radiation Oncology

## 2020-09-26 ENCOUNTER — Inpatient Hospital Stay: Payer: Medicare Other

## 2020-09-26 DIAGNOSIS — Z510 Encounter for antineoplastic radiation therapy: Secondary | ICD-10-CM | POA: Diagnosis not present

## 2020-09-29 ENCOUNTER — Inpatient Hospital Stay: Payer: Medicare Other

## 2020-09-29 ENCOUNTER — Ambulatory Visit
Admission: RE | Admit: 2020-09-29 | Discharge: 2020-09-29 | Disposition: A | Discharge status: Home / Self Care | Payer: Medicare Other | Source: Ambulatory Visit | Attending: Radiation Oncology | Admitting: Radiation Oncology

## 2020-09-29 DIAGNOSIS — Z510 Encounter for antineoplastic radiation therapy: Secondary | ICD-10-CM | POA: Diagnosis not present

## 2020-10-23 ENCOUNTER — Encounter: Payer: Self-pay | Admitting: Ophthalmology

## 2020-11-02 NOTE — Anesthesia Preprocedure Evaluation (Addendum)
Anesthesia Evaluation  Patient identified by MRN, date of birth, ID band Patient awake    Reviewed: Allergy & Precautions, NPO status , Patient's Chart, lab work & pertinent test results  History of Anesthesia Complications Negative for: history of anesthetic complications  Airway Mallampati: III   Neck ROM: Full    Dental  (+) Edentulous Upper, Edentulous Lower   Pulmonary Current Smoker and Patient abstained from smoking.,    Pulmonary exam normal breath sounds clear to auscultation       Cardiovascular hypertension, + Peripheral Vascular Disease  Normal cardiovascular exam+ dysrhythmias (a fib on Eliquis)  Rhythm:Regular Rate:Normal  Hx DVT/PE; cardiomyopathy   Neuro/Psych Hx cocaine use, last 5 years ago CVA (with residual right-sided weakness, uses cane to ambulate)    GI/Hepatic negative GI ROS,   Endo/Other  diabetes, Type 2  Renal/GU      Musculoskeletal  (+) Arthritis , Gout    Abdominal   Peds  Hematology  (+) Blood dyscrasia, anemia , Prostate CA   Anesthesia Other Findings   Reproductive/Obstetrics                            Anesthesia Physical Anesthesia Plan  ASA: 4  Anesthesia Plan: MAC   Post-op Pain Management:    Induction: Intravenous  PONV Risk Score and Plan: 0 and TIVA, Midazolam and Treatment may vary due to age or medical condition  Airway Management Planned: Nasal Cannula  Additional Equipment:   Intra-op Plan:   Post-operative Plan:   Informed Consent: I have reviewed the patients History and Physical, chart, labs and discussed the procedure including the risks, benefits and alternatives for the proposed anesthesia with the patient or authorized representative who has indicated his/her understanding and acceptance.       Plan Discussed with: CRNA  Anesthesia Plan Comments:        Anesthesia Quick Evaluation

## 2020-11-03 ENCOUNTER — Inpatient Hospital Stay: Payer: Medicare Other | Attending: Radiation Oncology

## 2020-11-03 ENCOUNTER — Other Ambulatory Visit: Payer: Self-pay

## 2020-11-03 ENCOUNTER — Ambulatory Visit
Admission: RE | Admit: 2020-11-03 | Discharge: 2020-11-03 | Disposition: A | Discharge status: Home / Self Care | Payer: Medicare Other | Source: Ambulatory Visit | Attending: Radiation Oncology | Admitting: Radiation Oncology

## 2020-11-03 VITALS — BP 150/90 | HR 76 | Temp 98.5°F | Resp 20 | Wt 218.4 lb

## 2020-11-03 DIAGNOSIS — C61 Malignant neoplasm of prostate: Secondary | ICD-10-CM | POA: Diagnosis present

## 2020-11-03 DIAGNOSIS — Z923 Personal history of irradiation: Secondary | ICD-10-CM | POA: Diagnosis not present

## 2020-11-03 NOTE — Progress Notes (Signed)
Radiation Oncology Follow up Note  Name: [name]   Date:   11/03/2020 MRN:  [id number] DOB: 1950-12-21    This 70 y.o. male presents to the clinic today for 1 month follow-up status post IMRT image guided radiation therapy for Gleason 7 adenocarcinoma the prostate presenting with a PSA of 110.  REFERRING PROVIDER: Theotis Burrow*  HPI: Patient is a.  70 year old male now 1 month out having completed radiation therapy to both his prostate and pelvic nodes for a Gleason 7 (3+4) adenocarcinoma the prostate presenting with extremely high PSA of 110.  Seen today in routine follow-up 1 month out he is doing well he specifically denies any increased lower urinary tract symptoms diarrhea or fatigue.    COMPLICATIONS OF TREATMENT: none  FOLLOW UP COMPLIANCE: keeps appointments   PHYSICAL EXAM:  BP (!) 150/90   Pulse 76   Temp 98.5 F (36.9 C) (Tympanic)   Resp 20   Wt 218 lb 6.4 oz (99.1 kg)   BMI 27.30 kg/m  Well-developed well-nourished patient in NAD. HEENT reveals PERLA, EOMI, discs not visualized.  Oral cavity is clear. No oral mucosal lesions are identified. Neck is clear without evidence of cervical or supraclavicular adenopathy. Lungs are clear to A&P. Cardiac examination is essentially unremarkable with regular rate and rhythm without murmur rub or thrill. Abdomen is benign with no organomegaly or masses noted. Motor sensory and DTR levels are equal and symmetric in the upper and lower extremities. Cranial nerves II through XII are grossly intact. Proprioception is intact. No peripheral adenopathy or edema is identified. No motor or sensory levels are noted. Crude visual fields are within normal range.  RADIOLOGY RESULTS: No current films to review  PLAN: Present time patient is doing well 1 month out from prostate radiation.  Very low side effect profile.  I have asked to see him back in 3 months with a PSA at that time.  Patient knows to call sooner with any can  concerns.  He continues on ADT therapy.  I would like to take this opportunity to thank you for allowing me to participate in the care of your patient.Noreene Filbert, MD

## 2020-11-04 ENCOUNTER — Encounter
Admission: RE | Disposition: A | Discharge status: Home / Self Care | Payer: Self-pay | Source: Home / Self Care | Attending: Ophthalmology

## 2020-11-04 ENCOUNTER — Encounter: Payer: Self-pay | Admitting: Ophthalmology

## 2020-11-04 ENCOUNTER — Ambulatory Visit: Payer: Medicare Other | Admitting: Anesthesiology

## 2020-11-04 ENCOUNTER — Ambulatory Visit
Admission: RE | Admit: 2020-11-04 | Discharge: 2020-11-04 | Disposition: A | Discharge status: Home / Self Care | Payer: Medicare Other | Attending: Ophthalmology | Admitting: Ophthalmology

## 2020-11-04 ENCOUNTER — Other Ambulatory Visit: Payer: Self-pay

## 2020-11-04 DIAGNOSIS — Z8673 Personal history of transient ischemic attack (TIA), and cerebral infarction without residual deficits: Secondary | ICD-10-CM | POA: Diagnosis not present

## 2020-11-04 DIAGNOSIS — Z95828 Presence of other vascular implants and grafts: Secondary | ICD-10-CM | POA: Insufficient documentation

## 2020-11-04 DIAGNOSIS — F17210 Nicotine dependence, cigarettes, uncomplicated: Secondary | ICD-10-CM | POA: Diagnosis not present

## 2020-11-04 DIAGNOSIS — Z8546 Personal history of malignant neoplasm of prostate: Secondary | ICD-10-CM | POA: Diagnosis not present

## 2020-11-04 DIAGNOSIS — Z86711 Personal history of pulmonary embolism: Secondary | ICD-10-CM | POA: Insufficient documentation

## 2020-11-04 DIAGNOSIS — E1136 Type 2 diabetes mellitus with diabetic cataract: Secondary | ICD-10-CM | POA: Diagnosis present

## 2020-11-04 DIAGNOSIS — Z7982 Long term (current) use of aspirin: Secondary | ICD-10-CM | POA: Insufficient documentation

## 2020-11-04 DIAGNOSIS — Z86718 Personal history of other venous thrombosis and embolism: Secondary | ICD-10-CM | POA: Diagnosis not present

## 2020-11-04 DIAGNOSIS — H2511 Age-related nuclear cataract, right eye: Secondary | ICD-10-CM | POA: Diagnosis not present

## 2020-11-04 DIAGNOSIS — Z833 Family history of diabetes mellitus: Secondary | ICD-10-CM | POA: Insufficient documentation

## 2020-11-04 DIAGNOSIS — Z79899 Other long term (current) drug therapy: Secondary | ICD-10-CM | POA: Diagnosis not present

## 2020-11-04 HISTORY — DX: Malignant (primary) neoplasm, unspecified: C80.1

## 2020-11-04 HISTORY — PX: CATARACT EXTRACTION W/PHACO: SHX586

## 2020-11-04 HISTORY — DX: Presence of dental prosthetic device (complete) (partial): Z97.2

## 2020-11-04 SURGERY — PHACOEMULSIFICATION, CATARACT, WITH IOL INSERTION
Anesthesia: Monitor Anesthesia Care | Site: Eye | Laterality: Right

## 2020-11-04 MED ORDER — MOXIFLOXACIN HCL 0.5 % OP SOLN
OPHTHALMIC | Status: DC | PRN
  Administered 2020-11-04: 0.2 mL via OPHTHALMIC

## 2020-11-04 MED ORDER — FENTANYL CITRATE (PF) 100 MCG/2ML IJ SOLN
INTRAMUSCULAR | Status: DC | PRN
  Administered 2020-11-04: 50 ug via INTRAVENOUS

## 2020-11-04 MED ORDER — ARMC OPHTHALMIC DILATING DROPS: 1.0000 "application " | OPHTHALMIC | Status: DC | PRN

## 2020-11-04 MED ORDER — LIDOCAINE HCL (PF) 2 % IJ SOLN
INTRAOCULAR | Status: DC | PRN
  Administered 2020-11-04: 1 mL

## 2020-11-04 MED ORDER — CYCLOPENTOLATE HCL 2 % OP SOLN
1.0000 [drp] | OPHTHALMIC | Status: DC | PRN
  Administered 2020-11-04 (×3): 1 [drp] via OPHTHALMIC

## 2020-11-04 MED ORDER — BRIMONIDINE TARTRATE-TIMOLOL 0.2-0.5 % OP SOLN
OPHTHALMIC | Status: DC | PRN
  Administered 2020-11-04: 1 [drp] via OPHTHALMIC

## 2020-11-04 MED ORDER — ACETAMINOPHEN 160 MG/5ML PO SOLN: 325.0000 mg | ORAL | Status: DC | PRN

## 2020-11-04 MED ORDER — MIDAZOLAM HCL 2 MG/2ML IJ SOLN
INTRAMUSCULAR | Status: DC | PRN
  Administered 2020-11-04: 1 mg via INTRAVENOUS

## 2020-11-04 MED ORDER — PHENYLEPHRINE HCL 10 % OP SOLN
1.0000 [drp] | OPHTHALMIC | Status: DC | PRN
  Administered 2020-11-04 (×3): 1 [drp] via OPHTHALMIC

## 2020-11-04 MED ORDER — ACETAMINOPHEN 325 MG PO TABS: 650.0000 mg | ORAL_TABLET | Freq: Once | ORAL | Status: DC | PRN

## 2020-11-04 MED ORDER — NA CHONDROIT SULF-NA HYALURON 40-17 MG/ML IO SOLN
INTRAOCULAR | Status: DC | PRN
  Administered 2020-11-04: 1 mL via INTRAOCULAR

## 2020-11-04 MED ORDER — LACTATED RINGERS IV SOLN: INTRAVENOUS | Status: DC

## 2020-11-04 MED ORDER — TETRACAINE HCL 0.5 % OP SOLN
1.0000 [drp] | OPHTHALMIC | Status: DC | PRN
  Administered 2020-11-04 (×3): 1 [drp] via OPHTHALMIC

## 2020-11-04 MED ORDER — ONDANSETRON HCL 4 MG/2ML IJ SOLN: 4.0000 mg | Freq: Once | INTRAMUSCULAR | Status: DC | PRN

## 2020-11-04 MED ORDER — EPINEPHRINE PF 1 MG/ML IJ SOLN
INTRAOCULAR | Status: DC | PRN
  Administered 2020-11-04: 97 mL via OPHTHALMIC

## 2020-11-04 SURGICAL SUPPLY — 18 items
CANNULA ANT/CHMB 27GA (MISCELLANEOUS) ×6 IMPLANT
GLOVE SURG TRIUMPH 8.0 PF LTX (GLOVE) ×3 IMPLANT
GOWN STRL REUS W/ TWL LRG LVL3 (GOWN DISPOSABLE) ×2 IMPLANT
GOWN STRL REUS W/TWL LRG LVL3 (GOWN DISPOSABLE) ×6
LENS IOL TECNIS EYHANCE 18.5 (Intraocular Lens) ×3 IMPLANT
MARKER SKIN DUAL TIP RULER LAB (MISCELLANEOUS) ×3 IMPLANT
NEEDLE FILTER BLUNT 18X 1/2SAF (NEEDLE) ×2
NEEDLE FILTER BLUNT 18X1 1/2 (NEEDLE) ×1 IMPLANT
PACK EYE AFTER SURG (MISCELLANEOUS) ×3 IMPLANT
PACK OPTHALMIC (MISCELLANEOUS) IMPLANT
PACK PORFILIO (MISCELLANEOUS) IMPLANT
RING MALYGIN (MISCELLANEOUS) IMPLANT
SUT ETHILON 10-0 CS-B-6CS-B-6 (SUTURE)
SUTURE EHLN 10-0 CS-B-6CS-B-6 (SUTURE) IMPLANT
SYR 3ML LL SCALE MARK (SYRINGE) ×3 IMPLANT
SYR TB 1ML LUER SLIP (SYRINGE) ×3 IMPLANT
WATER STERILE IRR 250ML POUR (IV SOLUTION) ×3 IMPLANT
WIPE NON LINTING 3.25X3.25 (MISCELLANEOUS) ×3 IMPLANT

## 2020-11-04 NOTE — Transfer of Care (Signed)
Immediate Anesthesia Transfer of Care Note  Patient: [name]  Procedure(s) Performed: CATARACT EXTRACTION PHACO AND INTRAOCULAR LENS PLACEMENT (IOC) RIGHT DIABETIC MALYUGIN (Right: Eye)  Patient Location: PACU  Anesthesia Type: MAC  Level of Consciousness: awake, alert  and patient cooperative  Airway and Oxygen Therapy: Patient Spontanous Breathing and Patient connected to supplemental oxygen  Post-op Assessment: Post-op Vital signs reviewed, Patient's Cardiovascular Status Stable, Respiratory Function Stable, Patent Airway and No signs of Nausea or vomiting  Post-op Vital Signs: Reviewed and stable  Complications: No notable events documented.

## 2020-11-04 NOTE — Anesthesia Procedure Notes (Signed)
Procedure Name: MAC Date/Time: 11/04/2020 7:32 AM Performed by: Dionne Bucy, CRNA Pre-anesthesia Checklist: Patient identified, Emergency Drugs available, Suction available, Patient being monitored and Timeout performed Oxygen Delivery Method: Nasal cannula Placement Confirmation: positive ETCO2

## 2020-11-04 NOTE — Anesthesia Postprocedure Evaluation (Signed)
Anesthesia Post Note  Patient: [name]  Procedure(s) Performed: CATARACT EXTRACTION PHACO AND INTRAOCULAR LENS PLACEMENT (IOC) RIGHT DIABETIC MALYUGIN (Right: Eye)     Patient location during evaluation: PACU Anesthesia Type: MAC Level of consciousness: awake and alert, oriented and patient cooperative Pain management: pain level controlled Vital Signs Assessment: post-procedure vital signs reviewed and stable Respiratory status: spontaneous breathing, nonlabored ventilation and respiratory function stable Cardiovascular status: blood pressure returned to baseline and stable Postop Assessment: adequate PO intake Anesthetic complications: no   No notable events documented.  Darrin Nipper

## 2020-11-04 NOTE — Op Note (Signed)
PREOPERATIVE DIAGNOSIS:  Nuclear sclerotic cataract of the right eye.   POSTOPERATIVE DIAGNOSIS:  Cataract   OPERATIVE PROCEDURE:ORPROCALL@   SURGEON:  Birder [name], MD.   ANESTHESIA:  Anesthesiologist: Darrin Nipper, MD CRNA: Dionne Bucy, CRNA  1.      Managed anesthesia care. 2.      0.67ml of Shugarcaine was instilled in the eye following the paracentesis.   COMPLICATIONS:  None.   TECHNIQUE:   Stop and chop   DESCRIPTION OF PROCEDURE:  The patient was examined and consented in the preoperative holding area where the aforementioned topical anesthesia was applied to the right eye and then brought back to the Operating Room where the right eye was prepped and draped in the usual sterile ophthalmic fashion and a lid speculum was placed. A paracentesis was created with the side port blade and the anterior chamber was filled with viscoelastic. A near clear corneal incision was performed with the steel keratome. A continuous curvilinear capsulorrhexis was performed with a cystotome followed by the capsulorrhexis forceps. Hydrodissection and hydrodelineation were carried out with BSS on a blunt cannula. The lens was removed in a stop and chop  technique and the remaining cortical material was removed with the irrigation-aspiration handpiece. The capsular bag was inflated with viscoelastic and the Technis [id number]  lens was placed in the capsular bag without complication. The remaining viscoelastic was removed from the eye with the irrigation-aspiration handpiece. The wounds were hydrated. The anterior chamber was flushed with BSS and the eye was inflated to physiologic pressure. 0.20ml of Vigamox was placed in the anterior chamber. The wounds were found to be water tight. The eye was dressed with Combigan. The patient was given protective glasses to wear throughout the day and a shield with which to sleep tonight. The patient was also given drops with which to begin a drop regimen today and will  follow-up with me in one day. Implant Name Type Inv. Item Serial No. Manufacturer Lot No. LRB No. Used Action  LENS IOL TECNIS EYHANCE 18.5 - [id number] Intraocular Lens LENS IOL TECNIS EYHANCE 18.5 [id number] JOHNSON   Right 1 Implanted   Procedure(s) with comments: CATARACT EXTRACTION PHACO AND INTRAOCULAR LENS PLACEMENT (IOC) RIGHT DIABETIC MALYUGIN (Right) - 8.56 0:54.1  Electronically signed: Birder [name] 11/04/2020 7:52 AM
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
## 2020-11-10 ENCOUNTER — Encounter: Payer: Self-pay | Admitting: Ophthalmology

## 2020-11-12 ENCOUNTER — Encounter: Payer: Self-pay | Admitting: Ophthalmology

## 2020-11-18 ENCOUNTER — Encounter: Payer: Self-pay | Admitting: Ophthalmology

## 2020-11-18 ENCOUNTER — Ambulatory Visit
Admission: RE | Admit: 2020-11-18 | Discharge: 2020-11-18 | Disposition: A | Discharge status: Home / Self Care | Payer: Medicare Other | Attending: Ophthalmology | Admitting: Ophthalmology

## 2020-11-18 ENCOUNTER — Ambulatory Visit: Payer: Medicare Other | Admitting: Anesthesiology

## 2020-11-18 ENCOUNTER — Encounter
Admission: RE | Disposition: A | Discharge status: Home / Self Care | Payer: Self-pay | Source: Home / Self Care | Attending: Ophthalmology

## 2020-11-18 ENCOUNTER — Other Ambulatory Visit: Payer: Self-pay

## 2020-11-18 DIAGNOSIS — Z79899 Other long term (current) drug therapy: Secondary | ICD-10-CM | POA: Insufficient documentation

## 2020-11-18 DIAGNOSIS — Z7901 Long term (current) use of anticoagulants: Secondary | ICD-10-CM | POA: Insufficient documentation

## 2020-11-18 DIAGNOSIS — Z833 Family history of diabetes mellitus: Secondary | ICD-10-CM | POA: Insufficient documentation

## 2020-11-18 DIAGNOSIS — Z86718 Personal history of other venous thrombosis and embolism: Secondary | ICD-10-CM | POA: Insufficient documentation

## 2020-11-18 DIAGNOSIS — Z8673 Personal history of transient ischemic attack (TIA), and cerebral infarction without residual deficits: Secondary | ICD-10-CM | POA: Insufficient documentation

## 2020-11-18 DIAGNOSIS — E1136 Type 2 diabetes mellitus with diabetic cataract: Secondary | ICD-10-CM | POA: Diagnosis not present

## 2020-11-18 DIAGNOSIS — F17210 Nicotine dependence, cigarettes, uncomplicated: Secondary | ICD-10-CM | POA: Insufficient documentation

## 2020-11-18 DIAGNOSIS — H2512 Age-related nuclear cataract, left eye: Secondary | ICD-10-CM | POA: Insufficient documentation

## 2020-11-18 DIAGNOSIS — Z95828 Presence of other vascular implants and grafts: Secondary | ICD-10-CM | POA: Diagnosis not present

## 2020-11-18 DIAGNOSIS — Z7982 Long term (current) use of aspirin: Secondary | ICD-10-CM | POA: Diagnosis not present

## 2020-11-18 DIAGNOSIS — Z8546 Personal history of malignant neoplasm of prostate: Secondary | ICD-10-CM | POA: Insufficient documentation

## 2020-11-18 DIAGNOSIS — Z86711 Personal history of pulmonary embolism: Secondary | ICD-10-CM | POA: Diagnosis not present

## 2020-11-18 HISTORY — PX: CATARACT EXTRACTION W/PHACO: SHX586

## 2020-11-18 SURGERY — PHACOEMULSIFICATION, CATARACT, WITH IOL INSERTION
Anesthesia: Monitor Anesthesia Care | Site: Eye | Laterality: Left

## 2020-11-18 MED ORDER — PHENYLEPHRINE HCL 10 % OP SOLN
1.0000 [drp] | OPHTHALMIC | Status: DC | PRN
  Administered 2020-11-18 (×3): 1 [drp] via OPHTHALMIC

## 2020-11-18 MED ORDER — BRIMONIDINE TARTRATE-TIMOLOL 0.2-0.5 % OP SOLN
OPHTHALMIC | Status: DC | PRN
  Administered 2020-11-18: 1 [drp] via OPHTHALMIC

## 2020-11-18 MED ORDER — SIGHTPATH DOSE#1 NA CHONDROIT SULF-NA HYALURON 40-17 MG/ML IO SOLN
INTRAOCULAR | Status: DC | PRN
  Administered 2020-11-18: 1 mL via INTRAOCULAR

## 2020-11-18 MED ORDER — FENTANYL CITRATE (PF) 100 MCG/2ML IJ SOLN
INTRAMUSCULAR | Status: DC | PRN
  Administered 2020-11-18: 50 ug via INTRAVENOUS

## 2020-11-18 MED ORDER — ACETAMINOPHEN 160 MG/5ML PO SOLN: 975.0000 mg | Freq: Once | ORAL | Status: DC | PRN

## 2020-11-18 MED ORDER — MIDAZOLAM HCL 2 MG/2ML IJ SOLN
INTRAMUSCULAR | Status: DC | PRN
  Administered 2020-11-18: 1 mg via INTRAVENOUS

## 2020-11-18 MED ORDER — SIGHTPATH DOSE#1 BSS IO SOLN
INTRAOCULAR | Status: DC | PRN
  Administered 2020-11-18: 72 mL via OPHTHALMIC

## 2020-11-18 MED ORDER — ONDANSETRON HCL 4 MG/2ML IJ SOLN: 4.0000 mg | Freq: Once | INTRAMUSCULAR | Status: DC | PRN

## 2020-11-18 MED ORDER — LACTATED RINGERS IV SOLN: INTRAVENOUS | Status: DC

## 2020-11-18 MED ORDER — LIDOCAINE HCL (PF) 2 % IJ SOLN
INTRAOCULAR | Status: DC | PRN
  Administered 2020-11-18: 1 mL

## 2020-11-18 MED ORDER — TETRACAINE HCL 0.5 % OP SOLN
1.0000 [drp] | OPHTHALMIC | Status: DC | PRN
  Administered 2020-11-18 (×3): 1 [drp] via OPHTHALMIC

## 2020-11-18 MED ORDER — CYCLOPENTOLATE HCL 2 % OP SOLN
1.0000 [drp] | OPHTHALMIC | Status: DC | PRN
  Administered 2020-11-18 (×3): 1 [drp] via OPHTHALMIC

## 2020-11-18 MED ORDER — ACETAMINOPHEN 500 MG PO TABS: 1000.0000 mg | ORAL_TABLET | Freq: Once | ORAL | Status: DC | PRN

## 2020-11-18 MED ORDER — MOXIFLOXACIN HCL 0.5 % OP SOLN
OPHTHALMIC | Status: DC | PRN
  Administered 2020-11-18: 0.2 mL via OPHTHALMIC

## 2020-11-18 SURGICAL SUPPLY — 15 items
CANNULA ANT/CHMB 27GA (MISCELLANEOUS) ×4 IMPLANT
GLOVE SURG ENC TEXT LTX SZ8 (GLOVE) ×2 IMPLANT
GLOVE SURG TRIUMPH 8.0 PF LTX (GLOVE) ×2 IMPLANT
GOWN STRL REUS W/ TWL LRG LVL3 (GOWN DISPOSABLE) ×2 IMPLANT
GOWN STRL REUS W/TWL LRG LVL3 (GOWN DISPOSABLE) ×4
LENS IOL TECNIS EYHANCE 20.5 (Intraocular Lens) ×2 IMPLANT
MARKER SKIN DUAL TIP RULER LAB (MISCELLANEOUS) ×2 IMPLANT
NEEDLE FILTER BLUNT 18X 1/2SAF (NEEDLE) ×1
NEEDLE FILTER BLUNT 18X1 1/2 (NEEDLE) ×1 IMPLANT
PACK EYE AFTER SURG (MISCELLANEOUS) ×2 IMPLANT
SYR 3ML LL SCALE MARK (SYRINGE) ×2 IMPLANT
SYR TB 1ML LUER SLIP (SYRINGE) ×2 IMPLANT
TIP ITREPID SGL USE BENT I/A (SUCTIONS) ×2 IMPLANT
WATER STERILE IRR 250ML POUR (IV SOLUTION) ×2 IMPLANT
WIPE NON LINTING 3.25X3.25 (MISCELLANEOUS) ×2 IMPLANT

## 2020-11-18 NOTE — Transfer of Care (Signed)
Immediate Anesthesia Transfer of Care Note  Patient: [name]  Procedure(s) Performed: CATARACT EXTRACTION PHACO AND INTRAOCULAR LENS PLACEMENT (IOC) LEFT DIABETIC  8.58 01:02.5 (Left: Eye)  Patient Location: PACU  Anesthesia Type: MAC  Level of Consciousness: awake, alert  and patient cooperative  Airway and Oxygen Therapy: Patient Spontanous Breathing and Patient connected to supplemental oxygen  Post-op Assessment: Post-op Vital signs reviewed, Patient's Cardiovascular Status Stable, Respiratory Function Stable, Patent Airway and No signs of Nausea or vomiting  Post-op Vital Signs: Reviewed and stable  Complications: No notable events documented.

## 2020-11-18 NOTE — Op Note (Signed)
PREOPERATIVE DIAGNOSIS:  Nuclear sclerotic cataract of the left eye.   POSTOPERATIVE DIAGNOSIS:  Nuclear sclerotic cataract of the left eye.   OPERATIVE PROCEDURE:ORPROCALL@   SURGEON:  Birder [name], MD.   ANESTHESIA:  Anesthesiologist: April Manson, MD CRNA: Cameron Ali, CRNA  1.      Managed anesthesia care. 2.     0.47ml of Shugarcaine was instilled following the paracentesis   COMPLICATIONS:  None.   TECHNIQUE:   Stop and chop   DESCRIPTION OF PROCEDURE:  The patient was examined and consented in the preoperative holding area where the aforementioned topical anesthesia was applied to the left eye and then brought back to the Operating Room where the left eye was prepped and draped in the usual sterile ophthalmic fashion and a lid speculum was placed. A paracentesis was created with the side port blade and the anterior chamber was filled with viscoelastic. A near clear corneal incision was performed with the steel keratome. A continuous curvilinear capsulorrhexis was performed with a cystotome followed by the capsulorrhexis forceps. Hydrodissection and hydrodelineation were carried out with BSS on a blunt cannula. The lens was removed in a stop and chop  technique and the remaining cortical material was removed with the irrigation-aspiration handpiece. The capsular bag was inflated with viscoelastic and the Technis ZCB00 lens was placed in the capsular bag without complication. The remaining viscoelastic was removed from the eye with the irrigation-aspiration handpiece. The wounds were hydrated. The anterior chamber was flushed with BSS and the eye was inflated to physiologic pressure. 0.73ml Vigamox was placed in the anterior chamber. The wounds were found to be water tight. The eye was dressed with Combigan. The patient was given protective glasses to wear throughout the day and a shield with which to sleep tonight. The patient was also given drops with which to begin a drop regimen  today and will follow-up with me in one day. Implant Name Type Inv. Item Serial No. Manufacturer Lot No. LRB No. Used Action  LENS IOL TECNIS EYHANCE 20.5 - [id number] Intraocular Lens LENS IOL TECNIS EYHANCE 20.5 [id number] JOHNSON   Left 1 Implanted    Procedure(s) with comments: CATARACT EXTRACTION PHACO AND INTRAOCULAR LENS PLACEMENT (IOC) LEFT DIABETIC  8.58 01:02.5 (Left) - Diabetic - no meds  Electronically signed: Birder [name] 11/18/2020 7:48 AM

## 2020-11-18 NOTE — Anesthesia Procedure Notes (Signed)
Procedure Name: MAC Date/Time: 11/18/2020 7:28 AM Performed by: Cameron Ali, CRNA Pre-anesthesia Checklist: Patient identified, Emergency Drugs available, Suction available, Timeout performed and Patient being monitored Patient Re-evaluated:Patient Re-evaluated prior to induction Oxygen Delivery Method: Nasal cannula Placement Confirmation: positive ETCO2

## 2020-11-18 NOTE — Anesthesia Preprocedure Evaluation (Signed)
Anesthesia Evaluation  Patient identified by MRN, date of birth, ID band Patient awake    Reviewed: Allergy & Precautions, NPO status , Patient's Chart, lab work & pertinent test results  History of Anesthesia Complications Negative for: history of anesthetic complications  Airway Mallampati: III   Neck ROM: Full    Dental  (+) Edentulous Upper, Edentulous Lower   Pulmonary Current Smoker and Patient abstained from smoking.,    Pulmonary exam normal breath sounds clear to auscultation       Cardiovascular hypertension, + Peripheral Vascular Disease and +CHF  Normal cardiovascular exam+ dysrhythmias (a fib on Eliquis)  Rhythm:Regular Rate:Normal  Hx DVT/PE; cardiomyopathy  4.2021 TTE -  IMPRESSIONS    1. Left ventricular ejection fraction, by estimation, is 40 to 45%. The  left ventricle has mildly decreased function. The left ventricle has no  regional wall motion abnormalities. The left ventricular internal cavity  size was mildly dilated. There is  mild left ventricular hypertrophy. Left ventricular diastolic parameters  are consistent with Grade I diastolic dysfunction (impaired relaxation).  2. Right ventricular systolic function is normal. The right ventricular  size is mildly enlarged. There is normal pulmonary artery systolic  pressure.  3. Left atrial size was mildly dilated.  4. Right atrial size was mildly dilated.  5. The mitral valve is grossly normal. Mild mitral valve regurgitation.  6. The aortic valve is grossly normal. Aortic valve regurgitation is  mild.    Neuro/Psych Hx cocaine use, last 5 years ago CVA (with residual right-sided weakness, uses cane to ambulate)    GI/Hepatic negative GI ROS,   Endo/Other  diabetes, Type 2  Renal/GU      Musculoskeletal  (+) Arthritis , Gout    Abdominal   Peds  Hematology  (+) Blood dyscrasia, anemia , Prostate CA   Anesthesia Other Findings    Reproductive/Obstetrics                             Anesthesia Physical  Anesthesia Plan  ASA: 4  Anesthesia Plan: MAC   Post-op Pain Management:    Induction: Intravenous  PONV Risk Score and Plan: 0 and TIVA, Midazolam and Treatment may vary due to age or medical condition  Airway Management Planned: Nasal Cannula  Additional Equipment:   Intra-op Plan:   Post-operative Plan:   Informed Consent: I have reviewed the patients History and Physical, chart, labs and discussed the procedure including the risks, benefits and alternatives for the proposed anesthesia with the patient or authorized representative who has indicated his/her understanding and acceptance.       Plan Discussed with: CRNA  Anesthesia Plan Comments:         Anesthesia Quick Evaluation

## 2020-11-18 NOTE — Anesthesia Postprocedure Evaluation (Signed)
Anesthesia Post Note  Patient: [name]  Procedure(s) Performed: CATARACT EXTRACTION PHACO AND INTRAOCULAR LENS PLACEMENT (IOC) LEFT DIABETIC  8.58 01:02.5 (Left: Eye)     Patient location during evaluation: PACU Anesthesia Type: MAC Level of consciousness: awake and alert Pain management: pain level controlled Vital Signs Assessment: post-procedure vital signs reviewed and stable Respiratory status: spontaneous breathing, nonlabored ventilation and respiratory function stable Cardiovascular status: stable and blood pressure returned to baseline Postop Assessment: no apparent nausea or vomiting Anesthetic complications: no   No notable events documented.  April Manson
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
## 2020-11-19 ENCOUNTER — Encounter: Payer: Self-pay | Admitting: Ophthalmology

## 2020-11-24 ENCOUNTER — Other Ambulatory Visit: Payer: Self-pay | Admitting: Physical Medicine and Rehabilitation

## 2020-11-27 ENCOUNTER — Encounter: Payer: Self-pay | Admitting: Podiatry

## 2020-11-27 ENCOUNTER — Other Ambulatory Visit: Payer: Self-pay

## 2020-11-27 ENCOUNTER — Ambulatory Visit (INDEPENDENT_AMBULATORY_CARE_PROVIDER_SITE_OTHER): Payer: Medicare Other | Admitting: Podiatry

## 2020-11-27 DIAGNOSIS — D689 Coagulation defect, unspecified: Secondary | ICD-10-CM | POA: Diagnosis not present

## 2020-11-27 DIAGNOSIS — M79674 Pain in right toe(s): Secondary | ICD-10-CM

## 2020-11-27 DIAGNOSIS — I739 Peripheral vascular disease, unspecified: Secondary | ICD-10-CM | POA: Diagnosis not present

## 2020-11-27 DIAGNOSIS — M79675 Pain in left toe(s): Secondary | ICD-10-CM

## 2020-11-27 DIAGNOSIS — E1142 Type 2 diabetes mellitus with diabetic polyneuropathy: Secondary | ICD-10-CM | POA: Diagnosis not present

## 2020-11-27 DIAGNOSIS — B351 Tinea unguium: Secondary | ICD-10-CM

## 2020-11-27 NOTE — Progress Notes (Signed)
This patient returns to my office for at risk foot care.  This patient requires this care by a professional since this patient will be at risk due to having diabetes, history of DVT and PAD.  Patient is taking eliquiss.  This patient is unable to cut nails himself since the patient cannot reach his nails.These nails are painful walking and wearing shoes.  This patient presents for at risk foot care today.  General Appearance  Alert, conversant and in no acute stress.  Vascular  Dorsalis pedis and posterior tibial  pulses are palpable  bilaterally.  Capillary return is within normal limits  bilaterally. Temperature is within normal limits  Bilaterally. Venous stasis legs  B/l.  Neurologic  Senn-Weinstein monofilament wire test diminished   bilaterally. Muscle power within normal limits bilaterally.  Nails Thick disfigured discolored nails with subungual debris  from hallux to fifth toes bilaterally. No evidence of bacterial infection or drainage bilaterally.  Orthopedic  No limitations of motion  feet .  No crepitus or effusions noted.  No bony pathology or digital deformities noted.  DJD midfoot right.  HAV  B/L  Skin  normotropic skin with no porokeratosis noted bilaterally.  No signs of infections or ulcers noted.     Onychomycosis  Pain in right toes  Pain in left toes  Consent was obtained for treatment procedures.   Mechanical debridement of nails 1-5  bilaterally performed with a nail nipper.  Filed with dremel without incident.    Return office visit   3 months                   Told patient to return for periodic foot care and evaluation due to potential at risk complications.   Gardiner Barefoot DPM
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
## 2021-01-27 ENCOUNTER — Ambulatory Visit (INDEPENDENT_AMBULATORY_CARE_PROVIDER_SITE_OTHER): Payer: Medicare Other | Admitting: Urology

## 2021-01-27 ENCOUNTER — Encounter: Payer: Self-pay | Admitting: Urology

## 2021-01-27 ENCOUNTER — Other Ambulatory Visit: Payer: Self-pay

## 2021-01-27 VITALS — BP 135/74 | HR 86 | Ht 75.0 in

## 2021-01-27 DIAGNOSIS — C61 Malignant neoplasm of prostate: Secondary | ICD-10-CM

## 2021-01-27 MED ORDER — LEUPROLIDE ACETATE (6 MONTH) 45 MG ~~LOC~~ KIT
45.0000 mg | PACK | Freq: Once | SUBCUTANEOUS | Status: AC
  Administered 2021-01-27: 45 mg via SUBCUTANEOUS

## 2021-01-27 NOTE — Progress Notes (Signed)
Eligard SubQ Injection   Due to Prostate Cancer patient is present today for a Eligard Injection.  Medication: Eligard 6 month Dose: 45 mg  Location: right  Lot: [id number]  Exp: 05/2022  Patient tolerated well, no complications were noted  Performed by: Lesli Albee  Per Dr. Erlene Quan patient is to continue therapy for 6 months . Patient's next follow up was scheduled for 6 months. This appointment was scheduled using wheel and given to patient today along with reminder continue on Vitamin D 800-1000iu and Calium 1000-1200mg  daily while on Androgen Deprivation Therapy.  PA approval dates:  [facility] PA obtained for Shiela Mayer # [id number], Dates : 07/11/20- 07/11/21.

## 2021-01-27 NOTE — Patient Instructions (Signed)
continue on Vitamin D 800-1000iu and Calium 1000-1200mg  daily while on Androgen Deprivation Therapy.  PA approval dates:

## 2021-01-28 ENCOUNTER — Inpatient Hospital Stay: Payer: Medicare Other | Attending: Radiation Oncology

## 2021-01-28 ENCOUNTER — Other Ambulatory Visit: Payer: Self-pay | Admitting: *Deleted

## 2021-01-28 DIAGNOSIS — C61 Malignant neoplasm of prostate: Secondary | ICD-10-CM

## 2021-01-28 LAB — PSA: Prostate Specific Ag, Serum: 0.4 ng/mL (ref 0.0–4.0)

## 2021-01-29 ENCOUNTER — Telehealth: Payer: Self-pay

## 2021-01-29 NOTE — Telephone Encounter (Signed)
Pt aware. Verbalized understanding.  

## 2021-01-29 NOTE — Telephone Encounter (Signed)
-----   Message from Hollice Espy, MD sent at 01/28/2021  8:03 AM EDT ----- PSA is down to 0.4, good news.    Hollice Espy, MD

## 2021-02-04 ENCOUNTER — Ambulatory Visit
Admission: RE | Admit: 2021-02-04 | Discharge: 2021-02-04 | Disposition: A | Discharge status: Home / Self Care | Payer: Medicare Other | Source: Ambulatory Visit | Attending: Radiation Oncology | Admitting: Radiation Oncology

## 2021-02-04 ENCOUNTER — Other Ambulatory Visit: Payer: Self-pay

## 2021-02-04 VITALS — BP 145/92 | HR 82 | Temp 97.6°F | Resp 20 | Wt 222.6 lb

## 2021-02-04 DIAGNOSIS — Z923 Personal history of irradiation: Secondary | ICD-10-CM | POA: Diagnosis not present

## 2021-02-04 DIAGNOSIS — C61 Malignant neoplasm of prostate: Secondary | ICD-10-CM | POA: Diagnosis not present

## 2021-02-04 NOTE — Progress Notes (Signed)
Radiation Oncology Follow up Note  Name: [name]   Date:   02/04/2021 MRN:  [id number] DOB: 31-Dec-1950    This 70 y.o. male presents to the clinic today for 68-month follow-up status post IMRT radiation therapy for Gleason 7 adenocarcinoma the prostate presenting with a PSA of 110  REFERRING PROVIDER: Theotis Burrow*  HPI: Patient is a 70 year old male now at 4 months having completed IMRT image guided radiation therapy for Gleason 7 adenocarcinoma the prostate..  Presenting with extremely high PSA of 110.  Seen today in routine follow-up he is doing well specifically denies any increased lower urinary tract symptoms or diarrhea.  He is currently on ADT therapy.  Most recent PSA is 0.4  COMPLICATIONS OF TREATMENT: none  FOLLOW UP COMPLIANCE: keeps appointments   PHYSICAL EXAM:  BP (!) 145/92   Pulse 82   Temp 97.6 F (36.4 C) (Tympanic)   Resp 20   Wt 222 lb 9.6 oz (101 kg)   BMI 27.82 kg/m  Well-developed well-nourished patient in NAD. HEENT reveals PERLA, EOMI, discs not visualized.  Oral cavity is clear. No oral mucosal lesions are identified. Neck is clear without evidence of cervical or supraclavicular adenopathy. Lungs are clear to A&P. Cardiac examination is essentially unremarkable with regular rate and rhythm without murmur rub or thrill. Abdomen is benign with no organomegaly or masses noted. Motor sensory and DTR levels are equal and symmetric in the upper and lower extremities. Cranial nerves II through XII are grossly intact. Proprioception is intact. No peripheral adenopathy or edema is identified. No motor or sensory levels are noted. Crude visual fields are within normal range.  RADIOLOGY RESULTS: No current films for review  PLAN: Present time patient is doing well excellent biochemical control of his prostate cancer.  We will leave it to urology to continue or pulses ADT therapy.  I am in favor of continuing him for least 2 years.  Patient is low side  effect profile of asked to see him back in 6 months for follow-up.  Patient knows to call with any concerns.  I would like to take this opportunity to thank you for allowing me to participate in the care of your patient.Noreene Filbert, MD

## 2021-03-05 ENCOUNTER — Encounter: Payer: Self-pay | Admitting: Podiatry

## 2021-03-05 ENCOUNTER — Other Ambulatory Visit: Payer: Self-pay

## 2021-03-05 ENCOUNTER — Ambulatory Visit (INDEPENDENT_AMBULATORY_CARE_PROVIDER_SITE_OTHER): Payer: Medicare Other | Admitting: Podiatry

## 2021-03-05 DIAGNOSIS — B351 Tinea unguium: Secondary | ICD-10-CM

## 2021-03-05 DIAGNOSIS — M79675 Pain in left toe(s): Secondary | ICD-10-CM

## 2021-03-05 DIAGNOSIS — I739 Peripheral vascular disease, unspecified: Secondary | ICD-10-CM | POA: Diagnosis not present

## 2021-03-05 DIAGNOSIS — M79674 Pain in right toe(s): Secondary | ICD-10-CM

## 2021-03-05 DIAGNOSIS — D689 Coagulation defect, unspecified: Secondary | ICD-10-CM | POA: Diagnosis not present

## 2021-03-05 DIAGNOSIS — E1142 Type 2 diabetes mellitus with diabetic polyneuropathy: Secondary | ICD-10-CM

## 2021-03-05 NOTE — Progress Notes (Signed)
This patient returns to my office for at risk foot care.  This patient requires this care by a professional since this patient will be at risk due to having diabetes, history of DVT and PAD.  Patient is taking eliquiss.  This patient is unable to cut nails himself since the patient cannot reach his nails.These nails are painful walking and wearing shoes.  This patient presents for at risk foot care today.  General Appearance  Alert, conversant and in no acute stress.  Vascular  Dorsalis pedis and posterior tibial  pulses are palpable  bilaterally.  Capillary return is within normal limits  bilaterally. Temperature is within normal limits  Bilaterally. Venous stasis legs  B/l.  Neurologic  Senn-Weinstein monofilament wire test diminished   bilaterally. Muscle power within normal limits bilaterally.  Nails Thick disfigured discolored nails with subungual debris  from hallux to fifth toes bilaterally. No evidence of bacterial infection or drainage bilaterally.  Orthopedic  No limitations of motion  feet .  No crepitus or effusions noted.  No bony pathology or digital deformities noted.  DJD midfoot right.  HAV  B/L  Skin  normotropic skin with no porokeratosis noted bilaterally.  No signs of infections or ulcers noted.     Onychomycosis  Pain in right toes  Pain in left toes  Consent was obtained for treatment procedures.   Mechanical debridement of nails 1-5  bilaterally performed with a nail nipper.  Filed with dremel without incident.    Return office visit   3 months                   Told patient to return for periodic foot care and evaluation due to potential at risk complications.   Gardiner Barefoot DPM

## 2021-05-21 ENCOUNTER — Encounter: Payer: Self-pay | Admitting: Podiatry

## 2021-06-11 ENCOUNTER — Ambulatory Visit: Payer: Medicare Other | Admitting: Podiatry

## 2021-06-18 ENCOUNTER — Ambulatory Visit: Payer: Medicare Other | Admitting: Podiatry

## 2021-06-29 ENCOUNTER — Other Ambulatory Visit: Payer: Self-pay

## 2021-06-29 ENCOUNTER — Encounter: Payer: Self-pay | Admitting: Podiatry

## 2021-06-29 ENCOUNTER — Ambulatory Visit (INDEPENDENT_AMBULATORY_CARE_PROVIDER_SITE_OTHER): Payer: Medicare Other | Admitting: Podiatry

## 2021-06-29 DIAGNOSIS — I739 Peripheral vascular disease, unspecified: Secondary | ICD-10-CM

## 2021-06-29 DIAGNOSIS — D689 Coagulation defect, unspecified: Secondary | ICD-10-CM | POA: Diagnosis not present

## 2021-06-29 DIAGNOSIS — M79675 Pain in left toe(s): Secondary | ICD-10-CM

## 2021-06-29 DIAGNOSIS — E1142 Type 2 diabetes mellitus with diabetic polyneuropathy: Secondary | ICD-10-CM | POA: Diagnosis not present

## 2021-06-29 DIAGNOSIS — M79674 Pain in right toe(s): Secondary | ICD-10-CM

## 2021-06-29 DIAGNOSIS — B351 Tinea unguium: Secondary | ICD-10-CM | POA: Diagnosis not present

## 2021-06-29 NOTE — Progress Notes (Signed)
This patient returns to my office for at risk foot care.  This patient requires this care by a professional since this patient will be at risk due to having diabetes, history of DVT and PAD.  Patient is taking eliquiss.  This patient is unable to cut nails himself since the patient cannot reach his nails.These nails are painful walking and wearing shoes.  This patient presents for at risk foot care today.  General Appearance  Alert, conversant and in no acute stress.  Vascular  Dorsalis pedis and posterior tibial  pulses are palpable  bilaterally.  Capillary return is within normal limits  bilaterally. Temperature is within normal limits  Bilaterally. Venous stasis legs  B/l.  Neurologic  Senn-Weinstein monofilament wire test diminished   bilaterally. Muscle power within normal limits bilaterally.  Nails Thick disfigured discolored nails with subungual debris  from hallux to fifth toes bilaterally. No evidence of bacterial infection or drainage bilaterally.  Orthopedic  No limitations of motion  feet .  No crepitus or effusions noted.  No bony pathology or digital deformities noted.  DJD midfoot right.  HAV  B/L  Skin  normotropic skin with no porokeratosis noted bilaterally.  No signs of infections or ulcers noted.     Onychomycosis  Pain in right toes  Pain in left toes  Consent was obtained for treatment procedures.   Mechanical debridement of nails 1-5  bilaterally performed with a nail nipper.  Filed with dremel without incident.    Return office visit   3 months                   Told patient to return for periodic foot care and evaluation due to potential at risk complications.   Gardiner Barefoot DPM
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
## 2021-07-27 ENCOUNTER — Other Ambulatory Visit: Payer: Self-pay

## 2021-07-27 ENCOUNTER — Other Ambulatory Visit: Payer: Medicare Other

## 2021-07-27 DIAGNOSIS — C61 Malignant neoplasm of prostate: Secondary | ICD-10-CM

## 2021-07-28 ENCOUNTER — Ambulatory Visit: Payer: Medicare Other | Admitting: Urology

## 2021-08-05 ENCOUNTER — Ambulatory Visit: Payer: Medicare Other | Admitting: Radiation Oncology

## 2021-08-09 ENCOUNTER — Other Ambulatory Visit: Payer: Self-pay

## 2021-08-09 ENCOUNTER — Observation Stay
Admission: EM | Admit: 2021-08-09 | Discharge: 2021-08-10 | Disposition: A | Discharge status: Home Health | Payer: Medicare Other | Attending: Internal Medicine | Admitting: Internal Medicine

## 2021-08-09 ENCOUNTER — Emergency Department: Payer: Medicare Other

## 2021-08-09 ENCOUNTER — Encounter: Payer: Self-pay | Admitting: Emergency Medicine

## 2021-08-09 DIAGNOSIS — Z7984 Long term (current) use of oral hypoglycemic drugs: Secondary | ICD-10-CM | POA: Diagnosis not present

## 2021-08-09 DIAGNOSIS — F17210 Nicotine dependence, cigarettes, uncomplicated: Secondary | ICD-10-CM | POA: Insufficient documentation

## 2021-08-09 DIAGNOSIS — A419 Sepsis, unspecified organism: Secondary | ICD-10-CM | POA: Diagnosis not present

## 2021-08-09 DIAGNOSIS — R059 Cough, unspecified: Secondary | ICD-10-CM | POA: Diagnosis present

## 2021-08-09 DIAGNOSIS — I482 Chronic atrial fibrillation, unspecified: Secondary | ICD-10-CM | POA: Diagnosis not present

## 2021-08-09 DIAGNOSIS — Z8673 Personal history of transient ischemic attack (TIA), and cerebral infarction without residual deficits: Secondary | ICD-10-CM | POA: Diagnosis not present

## 2021-08-09 DIAGNOSIS — I5042 Chronic combined systolic (congestive) and diastolic (congestive) heart failure: Secondary | ICD-10-CM | POA: Diagnosis not present

## 2021-08-09 DIAGNOSIS — Z86711 Personal history of pulmonary embolism: Secondary | ICD-10-CM | POA: Insufficient documentation

## 2021-08-09 DIAGNOSIS — M109 Gout, unspecified: Secondary | ICD-10-CM | POA: Diagnosis present

## 2021-08-09 DIAGNOSIS — Z8546 Personal history of malignant neoplasm of prostate: Secondary | ICD-10-CM | POA: Insufficient documentation

## 2021-08-09 DIAGNOSIS — Z7901 Long term (current) use of anticoagulants: Secondary | ICD-10-CM | POA: Insufficient documentation

## 2021-08-09 DIAGNOSIS — I110 Hypertensive heart disease with heart failure: Secondary | ICD-10-CM | POA: Diagnosis not present

## 2021-08-09 DIAGNOSIS — E782 Mixed hyperlipidemia: Secondary | ICD-10-CM | POA: Diagnosis not present

## 2021-08-09 DIAGNOSIS — Z20822 Contact with and (suspected) exposure to covid-19: Secondary | ICD-10-CM | POA: Diagnosis not present

## 2021-08-09 DIAGNOSIS — E1169 Type 2 diabetes mellitus with other specified complication: Secondary | ICD-10-CM | POA: Diagnosis not present

## 2021-08-09 DIAGNOSIS — I10 Essential (primary) hypertension: Secondary | ICD-10-CM | POA: Diagnosis present

## 2021-08-09 DIAGNOSIS — E1142 Type 2 diabetes mellitus with diabetic polyneuropathy: Secondary | ICD-10-CM | POA: Diagnosis not present

## 2021-08-09 DIAGNOSIS — R519 Headache, unspecified: Secondary | ICD-10-CM | POA: Insufficient documentation

## 2021-08-09 DIAGNOSIS — Z7982 Long term (current) use of aspirin: Secondary | ICD-10-CM | POA: Diagnosis not present

## 2021-08-09 DIAGNOSIS — I5043 Acute on chronic combined systolic (congestive) and diastolic (congestive) heart failure: Secondary | ICD-10-CM | POA: Diagnosis present

## 2021-08-09 DIAGNOSIS — Z86718 Personal history of other venous thrombosis and embolism: Secondary | ICD-10-CM | POA: Diagnosis not present

## 2021-08-09 DIAGNOSIS — Z79899 Other long term (current) drug therapy: Secondary | ICD-10-CM | POA: Insufficient documentation

## 2021-08-09 DIAGNOSIS — J189 Pneumonia, unspecified organism: Principal | ICD-10-CM | POA: Insufficient documentation

## 2021-08-09 LAB — COMPREHENSIVE METABOLIC PANEL
ALT: 18 U/L (ref 0–44)
ALT: 14 U/L (ref 0–44)
AST: 26 U/L (ref 15–41)
AST: 24 U/L (ref 15–41)
Albumin: 3.8 g/dL (ref 3.5–5.0)
Albumin: 3.2 g/dL — ABNORMAL LOW (ref 3.5–5.0)
Alkaline Phosphatase: 85 U/L (ref 38–126)
Alkaline Phosphatase: 72 U/L (ref 38–126)
Anion gap: 9 (ref 5–15)
Anion gap: 9 (ref 5–15)
BUN: 14 mg/dL (ref 8–23)
BUN: 15 mg/dL (ref 8–23)
CO2: 27 mmol/L (ref 22–32)
CO2: 25 mmol/L (ref 22–32)
Calcium: 8.6 mg/dL — ABNORMAL LOW (ref 8.9–10.3)
Calcium: 8.3 mg/dL — ABNORMAL LOW (ref 8.9–10.3)
Chloride: 99 mmol/L (ref 98–111)
Chloride: 100 mmol/L (ref 98–111)
Creatinine, Ser: 1.06 mg/dL (ref 0.61–1.24)
Creatinine, Ser: 1.01 mg/dL (ref 0.61–1.24)
GFR, Estimated: >60 mL/min (ref >60)
GFR, Estimated: >60 mL/min (ref >60)
Glucose, Bld: 107 mg/dL — ABNORMAL HIGH (ref 70–99)
Glucose, Bld: 133 mg/dL — ABNORMAL HIGH (ref 70–99)
Potassium: 3.8 mmol/L (ref 3.5–5.1)
Potassium: 3.5 mmol/L (ref 3.5–5.1)
Sodium: 135 mmol/L (ref 135–145)
Sodium: 134 mmol/L — ABNORMAL LOW (ref 135–145)
Total Bilirubin: 1 mg/dL (ref 0.3–1.2)
Total Bilirubin: 0.9 mg/dL (ref 0.3–1.2)
Total Protein: 7.4 g/dL (ref 6.5–8.1)
Total Protein: 6.1 g/dL — ABNORMAL LOW (ref 6.5–8.1)

## 2021-08-09 LAB — CBC WITH DIFFERENTIAL/PLATELET
Abs Immature Granulocytes: 0.01 10*3/uL (ref 0.00–0.07)
Abs Immature Granulocytes: 0.02 10*3/uL (ref 0.00–0.07)
Basophils Absolute: 0 10*3/uL (ref 0.0–0.1)
Basophils Absolute: 0 10*3/uL (ref 0.0–0.1)
Basophils Relative: 0 %
Basophils Relative: 0 %
Eosinophils Absolute: 0.1 10*3/uL (ref 0.0–0.5)
Eosinophils Absolute: 0.1 10*3/uL (ref 0.0–0.5)
Eosinophils Relative: 2 %
Eosinophils Relative: 2 %
HCT: 37 % — ABNORMAL LOW (ref 39.0–52.0)
HCT: 32.7 % — ABNORMAL LOW (ref 39.0–52.0)
Hemoglobin: 11.4 g/dL — ABNORMAL LOW (ref 13.0–17.0)
Hemoglobin: 10.2 g/dL — ABNORMAL LOW (ref 13.0–17.0)
Immature Granulocytes: 0 %
Immature Granulocytes: 1 %
Lymphocytes Relative: 21 %
Lymphocytes Relative: 22 %
Lymphs Abs: 0.7 10*3/uL (ref 0.7–4.0)
Lymphs Abs: 0.6 10*3/uL — ABNORMAL LOW (ref 0.7–4.0)
MCH: 24 pg — ABNORMAL LOW (ref 26.0–34.0)
MCH: 24.1 pg — ABNORMAL LOW (ref 26.0–34.0)
MCHC: 30.8 g/dL (ref 30.0–36.0)
MCHC: 31.2 g/dL (ref 30.0–36.0)
MCV: 77.9 fL — ABNORMAL LOW (ref 80.0–100.0)
MCV: 77.3 fL — ABNORMAL LOW (ref 80.0–100.0)
Monocytes Absolute: 0.3 10*3/uL (ref 0.1–1.0)
Monocytes Absolute: 0.3 10*3/uL (ref 0.1–1.0)
Monocytes Relative: 8 %
Monocytes Relative: 9 %
Neutro Abs: 2.3 10*3/uL (ref 1.7–7.7)
Neutro Abs: 1.9 10*3/uL (ref 1.7–7.7)
Neutrophils Relative %: 69 %
Neutrophils Relative %: 66 %
Platelets: 117 10*3/uL — ABNORMAL LOW (ref 150–400)
Platelets: 107 10*3/uL — ABNORMAL LOW (ref 150–400)
RBC: 4.75 MIL/uL (ref 4.22–5.81)
RBC: 4.23 MIL/uL (ref 4.22–5.81)
RDW: 17.5 % — ABNORMAL HIGH (ref 11.5–15.5)
RDW: 17.3 % — ABNORMAL HIGH (ref 11.5–15.5)
Smear Review: NORMAL
WBC: 3.4 10*3/uL — ABNORMAL LOW (ref 4.0–10.5)
WBC: 2.9 10*3/uL — ABNORMAL LOW (ref 4.0–10.5)
nRBC: 0 % (ref 0.0–0.2)
nRBC: 0 % (ref 0.0–0.2)

## 2021-08-09 LAB — GLUCOSE, CAPILLARY
Glucose-Capillary: 102 mg/dL — ABNORMAL HIGH (ref 70–99)
Glucose-Capillary: 94 mg/dL (ref 70–99)
Glucose-Capillary: 68 mg/dL — ABNORMAL LOW (ref 70–99)
Glucose-Capillary: 94 mg/dL (ref 70–99)
Glucose-Capillary: 115 mg/dL — ABNORMAL HIGH (ref 70–99)

## 2021-08-09 LAB — RESP PANEL BY RT-PCR (FLU A&B, COVID) ARPGX2
Influenza A by PCR: NEGATIVE
Influenza B by PCR: NEGATIVE
SARS Coronavirus 2 by RT PCR: NEGATIVE

## 2021-08-09 LAB — HEMOGLOBIN A1C
Hgb A1c MFr Bld: 6.6 % — ABNORMAL HIGH (ref 4.8–5.6)
Mean Plasma Glucose: 142.72 mg/dL

## 2021-08-09 LAB — MAGNESIUM: Magnesium: 1.7 mg/dL (ref 1.7–2.4)

## 2021-08-09 LAB — LACTIC ACID, PLASMA
Lactic Acid, Venous: 1.2 mmol/L (ref 0.5–1.9)
Lactic Acid, Venous: 1.2 mmol/L (ref 0.5–1.9)

## 2021-08-09 LAB — STREP PNEUMONIAE URINARY ANTIGEN: Strep Pneumo Urinary Antigen: NEGATIVE

## 2021-08-09 LAB — TROPONIN I (HIGH SENSITIVITY)
Troponin I (High Sensitivity): 37 ng/L — ABNORMAL HIGH (ref <18)
Troponin I (High Sensitivity): 36 ng/L — ABNORMAL HIGH (ref <18)

## 2021-08-09 LAB — BRAIN NATRIURETIC PEPTIDE: B Natriuretic Peptide: 489.2 pg/mL — ABNORMAL HIGH (ref 0.0–100.0)

## 2021-08-09 LAB — PROCALCITONIN: Procalcitonin: <0.1 ng/mL

## 2021-08-09 MED ORDER — ACETAMINOPHEN 325 MG PO TABS
650.0000 mg | ORAL_TABLET | Freq: Four times a day (QID) | ORAL | Status: DC | PRN
Start: 2021-08-09 — End: 2021-08-10
  Administered 2021-08-10: 650 mg via ORAL
  Filled 2021-08-09: qty 2

## 2021-08-09 MED ORDER — ACETAMINOPHEN 650 MG RE SUPP: 650.0000 mg | Freq: Four times a day (QID) | RECTAL | Status: DC | PRN

## 2021-08-09 MED ORDER — FUROSEMIDE 40 MG PO TABS
40.0000 mg | ORAL_TABLET | Freq: Two times a day (BID) | ORAL | Status: DC
  Administered 2021-08-09 – 2021-08-10 (×3): 40 mg via ORAL
  Filled 2021-08-09 (×3): qty 1

## 2021-08-09 MED ORDER — LOSARTAN POTASSIUM 50 MG PO TABS
50.0000 mg | ORAL_TABLET | Freq: Every day | ORAL | Status: DC
  Administered 2021-08-09 – 2021-08-10 (×2): 50 mg via ORAL
  Filled 2021-08-09 (×2): qty 1

## 2021-08-09 MED ORDER — ONDANSETRON HCL 4 MG PO TABS: 4.0000 mg | ORAL_TABLET | Freq: Four times a day (QID) | ORAL | Status: DC | PRN

## 2021-08-09 MED ORDER — ALLOPURINOL 100 MG PO TABS
300.0000 mg | ORAL_TABLET | Freq: Every day | ORAL | Status: DC
Start: 2021-08-09 — End: 2021-08-10
  Administered 2021-08-09 – 2021-08-10 (×2): 300 mg via ORAL
  Filled 2021-08-09 (×2): qty 3

## 2021-08-09 MED ORDER — HYDRALAZINE HCL 20 MG/ML IJ SOLN
10.0000 mg | Freq: Four times a day (QID) | INTRAMUSCULAR | Status: DC | PRN
  Administered 2021-08-09: 10 mg via INTRAVENOUS
  Filled 2021-08-09: qty 1

## 2021-08-09 MED ORDER — POLYETHYLENE GLYCOL 3350 17 G PO PACK: 17.0000 g | PACK | Freq: Every day | ORAL | Status: DC | PRN

## 2021-08-09 MED ORDER — LACTATED RINGERS IV SOLN: INTRAVENOUS | Status: DC

## 2021-08-09 MED ORDER — COLCHICINE 0.6 MG PO TABS
0.6000 mg | ORAL_TABLET | Freq: Every day | ORAL | Status: DC | PRN
  Filled 2021-08-09: qty 1

## 2021-08-09 MED ORDER — GABAPENTIN 100 MG PO CAPS
400.0000 mg | ORAL_CAPSULE | Freq: Three times a day (TID) | ORAL | Status: DC
  Administered 2021-08-09 – 2021-08-10 (×4): 400 mg via ORAL
  Filled 2021-08-09 (×4): qty 1

## 2021-08-09 MED ORDER — ATORVASTATIN CALCIUM 20 MG PO TABS
80.0000 mg | ORAL_TABLET | Freq: Every evening | ORAL | Status: DC
Start: 2021-08-09 — End: 2021-08-09

## 2021-08-09 MED ORDER — ONDANSETRON HCL 4 MG/2ML IJ SOLN: 4.0000 mg | Freq: Four times a day (QID) | INTRAMUSCULAR | Status: DC | PRN

## 2021-08-09 MED ORDER — ASPIRIN EC 81 MG PO TBEC
81.0000 mg | DELAYED_RELEASE_TABLET | Freq: Every day | ORAL | Status: DC
  Administered 2021-08-09 – 2021-08-10 (×2): 81 mg via ORAL
  Filled 2021-08-09 (×2): qty 1

## 2021-08-09 MED ORDER — ACETAMINOPHEN 500 MG PO TABS
1000.0000 mg | ORAL_TABLET | Freq: Once | ORAL | Status: AC
  Administered 2021-08-09: 1000 mg via ORAL
  Filled 2021-08-09: qty 2

## 2021-08-09 MED ORDER — METOPROLOL SUCCINATE ER 25 MG PO TB24
12.5000 mg | ORAL_TABLET | Freq: Every day | ORAL | Status: DC
  Administered 2021-08-09 – 2021-08-10 (×2): 12.5 mg via ORAL
  Filled 2021-08-09 (×2): qty 1

## 2021-08-09 MED ORDER — SIMVASTATIN 20 MG PO TABS
20.0000 mg | ORAL_TABLET | Freq: Every day | ORAL | Status: DC
Start: 2021-08-09 — End: 2021-08-10
  Administered 2021-08-09: 20 mg via ORAL
  Filled 2021-08-09: qty 1

## 2021-08-09 MED ORDER — SODIUM CHLORIDE 0.9 % IV SOLN
2.0000 g | INTRAVENOUS | Status: DC
  Administered 2021-08-09 – 2021-08-10 (×2): 2 g via INTRAVENOUS
  Filled 2021-08-09 (×2): qty 20

## 2021-08-09 MED ORDER — LACTATED RINGERS IV BOLUS (SEPSIS)
500.0000 mL | Freq: Once | INTRAVENOUS | Status: AC
  Administered 2021-08-09: 500 mL via INTRAVENOUS

## 2021-08-09 MED ORDER — TIOTROPIUM BROMIDE MONOHYDRATE 18 MCG IN CAPS
18.0000 ug | ORAL_CAPSULE | Freq: Every day | RESPIRATORY_TRACT | Status: DC
  Administered 2021-08-09 – 2021-08-10 (×2): 18 ug via RESPIRATORY_TRACT
  Filled 2021-08-09: qty 5

## 2021-08-09 MED ORDER — SODIUM CHLORIDE 0.9 % IV SOLN
500.0000 mg | INTRAVENOUS | Status: DC
  Administered 2021-08-09 – 2021-08-10 (×2): 500 mg via INTRAVENOUS
  Filled 2021-08-09 (×2): qty 5

## 2021-08-09 MED ORDER — APIXABAN 5 MG PO TABS
5.0000 mg | ORAL_TABLET | Freq: Two times a day (BID) | ORAL | Status: DC
Start: 2021-08-09 — End: 2021-08-10
  Administered 2021-08-09 – 2021-08-10 (×3): 5 mg via ORAL
  Filled 2021-08-09 (×3): qty 1

## 2021-08-09 MED ORDER — LATANOPROST 0.005 % OP SOLN
1.0000 [drp] | Freq: Every day | OPHTHALMIC | Status: DC
  Administered 2021-08-09: 1 [drp] via OPHTHALMIC
  Filled 2021-08-09: qty 2.5

## 2021-08-09 MED ORDER — INSULIN ASPART 100 UNIT/ML IJ SOLN: 0.0000 [IU] | Freq: Three times a day (TID) | INTRAMUSCULAR | Status: DC

## 2021-08-09 MED ORDER — AMLODIPINE BESYLATE 5 MG PO TABS
5.0000 mg | ORAL_TABLET | Freq: Every day | ORAL | Status: DC
  Administered 2021-08-09 – 2021-08-10 (×2): 5 mg via ORAL
  Filled 2021-08-09 (×2): qty 1

## 2021-08-09 NOTE — Assessment & Plan Note (Signed)
.   Resume patients home regimen of oral antihypertensives . Titrate antihypertensive regimen as necessary to achieve adequate BP control . PRN intravenous antihypertensives for excessively elevated blood pressure   

## 2021-08-09 NOTE — Assessment & Plan Note (Addendum)
?   Currently rate controlled on metoprolol ?? Continue home regimen of anticoagulation ?? Monitoring on telemetry ? ?

## 2021-08-09 NOTE — Assessment & Plan Note (Signed)
•   No clinical evidence of cardiogenic volume overload ° °

## 2021-08-09 NOTE — Progress Notes (Signed)
CODE SEPSIS - PHARMACY COMMUNICATION ? ?**Broad Spectrum Antibiotics should be administered within 1 hour of Sepsis diagnosis** ? ?Time Code Sepsis Called/Page Received: 2904 ? ?Antibiotics Ordered: Azithromycin & Ceftriaxone ? ?Time of 1st antibiotic administration: 0137 ? ?Renda Rolls, PharmD, MBA ?08/09/2021 ?1:35 AM ? ? ?
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
## 2021-08-09 NOTE — Sepsis Progress Note (Addendum)
Confirmed with the ED RN about the time blood cultures were drawn. ED RN reports that blood cultures were drawn at 0053 along with initial labs during patients triage and before antibiotics were given. Will continue to monitor code sepsis.  ?

## 2021-08-09 NOTE — Progress Notes (Signed)
Brief hospitalist update note.  This is a nonbillable note.  Please see same-day H&P from Dr. Cyd Silence for full billable details. ? ?Briefly, this is a 71 year old male with history of hypertension, atrial fibrillation on Eliquis, nicotine dependence, systolic and diastolic congestive heart failure, CVA with residual aphasia in 2021 who presents to [facility] ED with chief complaint of headache cough and shortness of breath. ? ?Laboratory investigation overall reassuring.  COVID-negative.  Patient did have low-grade fever on admission.  Chest x-ray with right lung infiltrates consistent with community-acquired pneumonia.  Negative procalcitonin however clinical symptoms consistent with a community-acquired pneumonia. ? ?Plan: ?Continue CAP coverage with azithromycin and Rocephin ?Diet as tolerated ?Monitor vitals and fever curve ?Oxygen if necessary ?Possible discharge in 24 hours ? ?Ralene Muskrat MD ?

## 2021-08-09 NOTE — Assessment & Plan Note (Signed)
.   Continuing home regimen of lipid lowering therapy.  

## 2021-08-09 NOTE — Plan of Care (Signed)

## 2021-08-09 NOTE — Assessment & Plan Note (Addendum)
?   3 day history of cough productive of green sputum, myalgias and headaches. ?? Lung exam reveals significant right lower field rales  ?? Evidence of right-sided reticulonodular infiltrates on chest x-ray ?? Patient has been placed on intravenous ceftriaxone and azithromycin ?? Procalcitonin normal however I believe clinically patient still has pneumonia and will treat as above.  ?? Providing supplemental oxygen for any associated hypoxia ?? Providing PRN bronchodilator therapy for any associated wheezing ?? Blood cultures obtained ?? Legionella and Strep Penumoniae Ag ordered  ? ?

## 2021-08-09 NOTE — ED Triage Notes (Signed)
Pt arrives via [facility], reports worsening H/A x 3 days with pressure behind eyes and forehead.   Pt presents hypertensive w/BP 171/99 per EMS.  Pt has strong cough and SHOB.  Pt states that he has been taking his BP medication as prescribed.  ?

## 2021-08-09 NOTE — Sepsis Progress Note (Signed)
Elink following code sepsis °

## 2021-08-09 NOTE — Assessment & Plan Note (Signed)
.   Patient been placed on Accu-Cheks before every meal and nightly with sliding scale insulin . Holding home regimen of hypoglycemics . Hemoglobin A1C ordered . Diabetic Diet  

## 2021-08-09 NOTE — Evaluation (Signed)
Physical Therapy Evaluation ?Patient Details ?Name: [name] ?MRN: [id number] ?DOB: 1951/04/05 ?Today's Date: 08/09/2021 ? ?History of Present Illness ? Pt is a 71 y/o M who presented on 08/09/21 with c/c of worsening HA x 3 days with pressure behind the eyes & forehead. Pt was hypertensive & also noted to have cough & SOB. Pt is being tx for PNA of RLL 2/2 infectious organism. PMH: prostate CA on hormonal infusions, a-fib on eliquis, PE, HTN, CVA, DM, stroke affecting R side  ?Clinical Impression ? Pt seen for PT evaluation with pt agreeable to tx & received in recliner. Pt reports prior to admission he was mod I with SPC, denies falls, living alone in 1 level apartment with level entry.  On this date, pt is able to complete STS from low recliner with supervision & ambulate 1 lap around nurses station with IV pole to simulate SPC with CGA fade to close supervision with gait pattern noted below. Pt reports his friend can assist him with cooking/cleaning upon d/c. Pt can d/c home with HHPT f/u but will continue to follow pt acutely to focus on endurance, gait with SPC, & balance. ?   ? ?Recommendations for follow up therapy are one component of a multi-disciplinary discharge planning process, led by the attending physician.  Recommendations may be updated based on patient status, additional functional criteria and insurance authorization. ? ?Follow Up Recommendations Home health PT ? ?  ?Assistance Recommended at Discharge PRN  ?Patient can return home with the following ? Assistance with cooking/housework;Assist for transportation ? ?  ?Equipment Recommendations None recommended by PT  ?Recommendations for Other Services ?    ?  ?Functional Status Assessment Patient has had a recent decline in their functional status and demonstrates the ability to make significant improvements in function in a reasonable and predictable amount of time.  ? ?  ?Precautions / Restrictions Precautions ?Precautions:  Fall ?Restrictions ?Weight Bearing Restrictions: No  ? ?  ? ?Mobility ? Bed Mobility ?  ?  ?  ?  ?  ?  ?  ?General bed mobility comments: not observed, pt received & left sitting in recliner ?  ? ?Transfers ?Overall transfer level: Needs assistance ?Equipment used: None ?Transfers: Sit to/from Stand ?Sit to Stand: Supervision ?  ?  ?  ?  ?  ?General transfer comment: extra time & effort as pt is 6'3" and transferring STS from very low recliner but pt is able to power up without assistance ?  ? ?Ambulation/Gait ?Ambulation/Gait assistance: Min guard, Supervision (CGA fade to supervision') ?Gait Distance (Feet): 165 Feet ?Assistive device: IV Pole (to simulate [place]) ?Gait Pattern/deviations: Decreased weight shift to left, Decreased step length - right, Decreased step length - left, Step-through pattern, Decreased stride length ?Gait velocity: slightly decreased ?  ?  ?  ? ?Stairs ?  ?  ?  ?  ?  ? ?Wheelchair Mobility ?  ? ?Modified Rankin (Stroke Patients Only) ?  ? ?  ? ?Balance Overall balance assessment: Mild deficits observed, not formally tested ?Sitting-balance support: Feet supported ?Sitting balance-Leahy Scale: Good ?  ?  ?Standing balance support: During functional activity, Single extremity supported ?Standing balance-Leahy Scale: Fair ?  ?  ?  ?  ?  ?  ?  ?  ?  ?  ?  ?  ?   ? ? ? ?Pertinent Vitals/Pain Pain Assessment ?Pain Assessment: c/o mild, unrated HA - monitored during session ?  ? ? ?Home Living Family/patient expects to be  discharged to:: Private residence ?Living Arrangements: Alone ?Available Help at Discharge: Friend(s);Available PRN/intermittently ?Type of Home: Apartment ?Home Access: Level entry ?  ?  ?  ?Home Layout: One level ?Home Equipment: [name] - single point ?   ?  ?Prior Function Prior Level of Function : Independent/Modified Independent ?  ?  ?  ?  ?  ?  ?Mobility Comments: Pt reports he's mod I with SPC, denies falls. ?  ?  ? ? ?Hand Dominance  ?   ? ?  ?Extremity/Trunk Assessment  ?  Upper Extremity Assessment ?Upper Extremity Assessment: Overall WFL for tasks assessed ?  ? ?Lower Extremity Assessment ?Lower Extremity Assessment: Generalized weakness ?  ? ?   ?Communication  ? Communication: HOH (somewhat difficult to understand at times)  ?Cognition Arousal/Alertness: Awake/alert ?Behavior During Therapy: [facility] for tasks assessed/performed ?Overall Cognitive Status: Within Functional Limits for tasks assessed ?  ?  ?  ?  ?  ?  ?  ?  ?  ?  ?  ?  ?  ?  ?  ?  ?  ?  ?  ? ?  ?General Comments   ? ?  ?Exercises    ? ?Assessment/Plan  ?  ?PT Assessment Patient needs continued PT services  ?PT Problem List Decreased strength;Decreased mobility;Decreased activity tolerance;Decreased balance;Cardiopulmonary status limiting activity ? ?   ?  ?PT Treatment Interventions Therapeutic exercise;DME instruction;Gait training;Balance training;Stair training;Neuromuscular re-education;Functional mobility training;Therapeutic activities;Patient/family education   ? ?PT Goals (Current goals can be found in the Care Plan section)  ?Acute Rehab PT Goals ?Patient Stated Goal: get better ?PT Goal Formulation: With patient ?Time For Goal Achievement: 08/23/21 ?Potential to Achieve Goals: Good ? ?  ?Frequency Min 2X/week ?  ? ? ?Co-evaluation   ?  ?  ?  ?  ? ? ?  ?AM-PAC PT "6 Clicks" Mobility  ?Outcome Measure Help needed turning from your back to your side while in a flat bed without using bedrails?: None ?Help needed moving from lying on your back to sitting on the side of a flat bed without using bedrails?: None ?Help needed moving to and from a bed to a chair (including a wheelchair)?: A Little ?Help needed standing up from a chair using your arms (e.g., wheelchair or bedside chair)?: A Little ?Help needed to walk in hospital room?: A Little ?Help needed climbing 3-5 steps with a railing? : A Little ?6 Click Score: 20 ? ?  ?End of Session Equipment Utilized During Treatment: Gait belt ?Activity Tolerance: Patient  tolerated treatment well ?Patient left: in chair;with call bell/phone within reach ?  ?PT Visit Diagnosis: Muscle weakness (generalized) (M62.81);Unsteadiness on feet (R26.81) ?  ? ?Time: 3825-0539 ?PT Time Calculation (min) (ACUTE ONLY): 11 min ? ? ?Charges:   PT Evaluation ?$PT Eval Low Complexity: 1 Low ?  ?  ?   ? ? ?Lavone Nian, PT, DPT ?08/09/21, 2:46 PM ? ? ?Waunita Schooner ?08/09/2021, 2:44 PM ? ?
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
## 2021-08-09 NOTE — Assessment & Plan Note (Signed)
?   Continue home regimen of Allopurinol ?

## 2021-08-10 DIAGNOSIS — J189 Pneumonia, unspecified organism: Secondary | ICD-10-CM | POA: Diagnosis not present

## 2021-08-10 LAB — GLUCOSE, CAPILLARY
Glucose-Capillary: 106 mg/dL — ABNORMAL HIGH (ref 70–99)
Glucose-Capillary: 110 mg/dL — ABNORMAL HIGH (ref 70–99)

## 2021-08-10 MED ORDER — AMOXICILLIN-POT CLAVULANATE 875-125 MG PO TABS: 1.0000 | ORAL_TABLET | Freq: Two times a day (BID) | ORAL | 0 refills | Status: AC

## 2021-08-10 NOTE — TOC Initial Note (Addendum)
Transition of Care (TOC) - Initial/Assessment Note  ? ? ?Patient Details  ?Name: [name] ?MRN: [id number] ?Date of Birth: [birth year] ? ?Transition of Care (TOC) CM/SW Contact:    ?Pete Pelt, RN ?Phone Number: ?08/10/2021, 9:34 AM ? ?Clinical Narrative:      Patient lives at home alone.  He states he has a girlfriend and others that can assist if needed with transportation.   ? ?Patient has no medication concerns.   ? ?Home health PT ordered.  Reached out to [place] with [facility] for PT.  Patient is accepting of [facility] PT and states he has no other TOC needs at this time.        ? ?[facility] will accept patient for services, confirmed by Corene Cornea ?Expected Discharge Plan: [place] ?Barriers to Discharge: Barriers Resolved ? ? ?Patient Goals and CMS Choice ?  ?  ?Choice offered to / list presented to : NA ? ?Expected Discharge Plan and Services ?Expected Discharge Plan: [place] ?  ?Discharge Planning Services: CM Consult ?Post Acute Care Choice: Home Health ?Living arrangements for the past 2 months: [place] ?Expected Discharge Date: 08/10/21               ?  ?  ?  ?  ?  ?HH Arranged: PT ?  ?  ?  ?  ? ?Prior Living Arrangements/Services ?Living arrangements for the past 2 months: [place] ?Lives with:: Self ?Patient language and need for interpreter reviewed:: Yes ?Do you feel safe going back to the place where you live?: Yes      ?Need for Family Participation in Patient Care: Yes (Comment) ?Care giver support system in place?: Yes (comment) ?  ?Criminal Activity/Legal Involvement Pertinent to Current Situation/Hospitalization: No - Comment as needed ? ?Activities of Daily Living ?Home Assistive Devices/Equipment: Eyeglasses, [name] (specify quad or straight) ?ADL Screening (condition at time of admission) ?Patient's cognitive ability adequate to safely complete daily activities?: Yes ?Is the patient deaf or have difficulty hearing?: No ?Does the  patient have difficulty seeing, even when wearing glasses/contacts?: No ?Does the patient have difficulty concentrating, remembering, or making decisions?: No ?Patient able to express need for assistance with ADLs?: Yes ?Does the patient have difficulty dressing or bathing?: No ?Independently performs ADLs?: Yes (appropriate for developmental age) ?Does the patient have difficulty walking or climbing stairs?: No ?Weakness of Legs: Both ?Weakness of Arms/Hands: Both ? ?Permission Sought/Granted ?Permission sought to share information with : Case Manager ?Permission granted to share information with : Yes, Verbal Permission Granted ?   ? Permission granted to share info w AGENCY: Prospective HH agencies ?   ?   ? ?Emotional Assessment ?Appearance:: Appears stated age ?Attitude/Demeanor/Rapport: Gracious, Engaged ?Affect (typically observed): Pleasant, Appropriate ?Orientation: : Oriented to Self, Oriented to Place, Oriented to  Time, Oriented to Situation ?Alcohol / Substance Use: Not Applicable ?Psych Involvement: No (comment) ? ?Admission diagnosis:  Pneumonia of right lung due to infectious organism [J18.9] ?Community acquired pneumonia, unspecified laterality [J18.9] ?Sepsis without acute organ dysfunction, due to unspecified organism [facility]) [A41.9] ?Patient Active Problem List  ? Diagnosis Date Noted  ? Pneumonia of right lower lobe due to infectious organism 08/09/2021  ? Elevated troponin 01/28/2020  ? UTI (urinary tract infection) 01/28/2020  ? Hypokalemia 01/28/2020  ? Diarrhea 01/28/2020  ? Chest pain 01/28/2020  ? Chronic combined systolic (congestive) and diastolic (congestive) heart failure ([place]) 01/28/2020  ? Acute metabolic encephalopathy 40/98/1191  ?  Coagulation disorder ([place]) 11/29/2019  ? AKI (acute kidney injury) ([place])   ? History of cocaine abuse ([place])   ? Acute ischemic right PCA stroke ([place]) 09/13/2019  ? Vision loss, bilateral   ? Tobacco abuse   ? Bradycardia   ? Non-sustained ventricular  tachycardia   ? Dizziness 09/12/2019  ? Nonintractable headache   ? Slurred speech   ? Mixed diabetic hyperlipidemia associated with type 2 diabetes mellitus ([place])   ? Finger joint swelling, right 06/27/2019  ? Bilateral hand pain 06/14/2019  ? PAD (peripheral artery disease) ([place]) 05/20/2019  ? Encounter for long-term (current) use of high-risk medication 04/30/2019  ? Atrial fibrillation, chronic ([place]) 04/08/2019  ? Fever 04/08/2019  ? Severe sepsis ([place]) 04/08/2019  ? Abdominal pain 04/07/2019  ? 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 Polyarthralgia 03/05/2019  ? Thrombocytopenia ([place]) 03/05/2019  ? Pain due to onychomycosis of toenails of both feet 11/20/2018  ? Diabetic neuropathy ([place]) 11/20/2018  ? Influenza A 06/22/2018  ? Community acquired pneumonia 06/22/2018  ? Arthritis 05/12/2017  ? Varicose veins of leg with swelling, bilateral 01/18/2017  ? Facial twitching   ? 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 ([place]) 02/24/2015  ? Pneumonia 10/12/2014  ? Osteoarthritis of right knee 02/11/2014  ? Pain and swelling of knee 01/08/2014  ? Pain in extremity 01/08/2014  ? Weakness due to cerebrovascular accident 01/08/2014  ? Pulmonary embolism ([place]) 06/19/2012  ? Gout 09/22/2011  ? CVA, old, speech/language deficit 09/01/2011  ? Cerebral infarction ([place]) 04/26/2011  ? Type 2 diabetes mellitus with diabetic polyneuropathy, without long-term current use of insulin ([place]) 04/26/2011  ? Accelerated hypertension 04/26/2011  ? Anemia 04/26/2011  ? DVT (deep venous thrombosis) ([place]) 04/26/2011  ? Essential hypertension 04/26/2011  ? ?PCP:  Theotis Burrow, MD ?Pharmacy:   ?[facility] [place] [address] ([place]) [place] [address] ?Phone: [phone] Fax: [phone] ? ? ? ? ?Social Determinants of Health (SDOH) Interventions ?  ? ?Readmission Risk Interventions ? ?  01/29/2020  ? 12:41 PM  ?Readmission Risk Prevention  Plan  ?Transportation Screening Complete  ?Palliative Care Screening Not Applicable  ?Medication Review Press photographer) Complete  ? ? ? ?

## 2021-08-10 NOTE — Discharge Summary (Signed)
Physician Discharge Summary  ?[name] MWN:[id number] DOB: 04/06/1951 DOA: 08/09/2021 ? ?PCP: Theotis Burrow, MD ? ?Admit date: 08/09/2021 ?Discharge date: 08/10/2021 ? ?Admitted From: Home ?Disposition: Home with home health ? ?Recommendations for Outpatient Follow-up:  ?Follow up with PCP in 1-2 weeks ? ? ?Home Health: Yes PT ?Equipment/Devices: None ? ?Discharge Condition: Stable ?CODE STATUS: Full ?Diet recommendation: Regular ? ?Brief/Interim Summary: ?71 year old male with history of hypertension, atrial fibrillation on Eliquis, nicotine dependence, systolic and diastolic congestive heart failure, CVA with residual aphasia in 2021 who presents to [facility] ED with chief complaint of headache cough and shortness of breath. ?  ?Laboratory investigation overall reassuring.  COVID-negative.  Patient did have low-grade fever on admission.  Chest x-ray with right lung infiltrates consistent with community-acquired pneumonia.  Negative procalcitonin however clinical symptoms consistent with a community-acquired pneumonia. ? ?Patient remained stable and fever free for duration of hospitalization.  He is sitting up in chair eating breakfast on day of discharge.  Respiratory status back to baseline.  Stable for discharge home.  Discontinue Rocephin and azithromycin.  Transition to Augmentin to complete total 7-day antibiotic course.  Home health PT services ordered. ? ? ? ?Discharge Diagnoses:  ?Principal Problem: ?  Pneumonia of right lower lobe due to infectious organism ?Active Problems: ?  Atrial fibrillation, chronic ([place]) ?  Chronic combined systolic (congestive) and diastolic (congestive) heart failure (HCC) ?  Essential hypertension ?  Type 2 diabetes mellitus with diabetic polyneuropathy, without long-term current use of insulin ([place]) ?  Gout ?  Mixed diabetic hyperlipidemia associated with type 2 diabetes mellitus ([place]) ? ?Right-sided community-acquired pneumonia ?Patient presented with 3-day history  of cough productive of greenish sputum, myalgia, headaches.  Chest imaging with right lower lobe infiltrates.  Patient remained on room air and fever free for duration of hospitalization.  Stable for discharge home at this time.  Will transition to p.o. Augmentin.  Complete additional 6 days post discharge complete 7-day antibiotic course ? ?Discharge Instructions ? ?Discharge Instructions   ? ? Diet - low sodium heart healthy   Complete by: As directed ?  ? Increase activity slowly   Complete by: As directed ?  ? ?  ? ?Allergies as of 08/10/2021   ?No Known Allergies ?  ? ?  ?Medication List  ?  ? ?TAKE these medications   ? ?acetaminophen 325 MG tablet ?Commonly known as: TYLENOL ?Take 1-2 tablets (325-650 mg total) by mouth every 4 (four) hours as needed for mild pain. ?  ?allopurinol 300 MG tablet ?Commonly known as: ZYLOPRIM ?Take 1 tablet (300 mg total) by mouth daily. ?  ?amLODipine 5 MG tablet ?Commonly known as: NORVASC ?Take 5 mg by mouth daily. ?  ?amoxicillin-clavulanate 875-125 MG tablet ?Commonly known as: Augmentin ?Take 1 tablet by mouth 2 (two) times daily for 6 days. ?  ?apixaban 5 MG Tabs tablet ?Commonly known as: Eliquis ?Take 1 tablet (5 mg total) by mouth 2 (two) times daily. ?  ?aspirin 81 MG EC tablet ?Take 1 tablet (81 mg total) by mouth daily. ?  ?atorvastatin 80 MG tablet ?Commonly known as: LIPITOR ?Take 1 tablet (80 mg total) by mouth every evening. ?  ?colchicine 0.6 MG tablet ?Take 1 tablet (0.6 mg total) by mouth daily. Stop taking when your symptoms resolve. ?  ?furosemide 40 MG tablet ?Commonly known as: LASIX ?Take 40 mg by mouth 2 (two) times daily. ?  ?gabapentin 400 MG capsule ?Commonly known as: NEURONTIN ?Take 400 mg by mouth  3 (three) times daily. ?  ?Jardiance 10 MG Tabs tablet ?Generic drug: empagliflozin ?Take 10 mg by mouth daily. ?  ?latanoprost 0.005 % ophthalmic solution ?Commonly known as: XALATAN ?Place 1 drop into both eyes at bedtime. ?  ?losartan 50 MG  tablet ?Commonly known as: COZAAR ?Take 50 mg by mouth daily. ?  ?metoprolol succinate 25 MG 24 hr tablet ?Commonly known as: TOPROL-XL ?Take 12.5 mg by mouth daily. ?  ?Mucinex DM 30-600 MG Tb12 ?SMARTSIG:1-2 Tablet(s) By Mouth Every 12 Hours PRN ?  ?simvastatin 20 MG tablet ?Commonly known as: ZOCOR ?Take 1 tablet by mouth at bedtime. ?  ?Spiriva Respimat 1.25 MCG/ACT Aers ?Generic drug: Tiotropium Bromide Monohydrate ?Take 1 spray by mouth daily. ?  ? ?  ? ? ?No Known Allergies ? ?Consultations: ?None ? ? ?Procedures/Studies: ?CT HEAD WO CONTRAST (5MM) ? ?Result Date: 08/09/2021 ?CLINICAL DATA:  Headache. EXAM: CT HEAD WITHOUT CONTRAST TECHNIQUE: Contiguous axial images were obtained from the base of the skull through the vertex without intravenous contrast. RADIATION DOSE REDUCTION: This exam was performed according to the departmental dose-optimization program which includes automated exposure control, adjustment of the mA and/or kV according to patient size and/or use of iterative reconstruction technique. COMPARISON:  Head CT dated 02/02/2020. FINDINGS: Brain: There is mild age-related atrophy and chronic microvascular ischemic changes. Left MCA territory as well as bilateral occipital old infarcts and encephalomalacia. There is no acute intracranial hemorrhage. No mass effect or midline shift. No extra-axial fluid collection. Vascular: No hyperdense vessel or unexpected calcification. Skull: Normal. Negative for fracture or focal lesion. Sinuses/Orbits: Diffuse mucoperiosteal thickening of paranasal sinuses. No air-fluid level. The mastoid air cells are clear. Other: None IMPRESSION: 1. No acute intracranial pathology. 2. Mild age-related atrophy and chronic microvascular ischemic changes. Old left MCA territory and bilateral occipital infarcts and encephalomalacia. Electronically Signed   By: Anner Crete M.D.   On: 08/09/2021 01:24  ? ?DG Chest Portable 1 View ? ?Result Date: 08/09/2021 ?CLINICAL DATA:   Headache, shortness of breath and cough EXAM: PORTABLE CHEST 1 VIEW COMPARISON:  01/28/2020 FINDINGS: Moderate cardiomegaly. No focal airspace consolidation. Reticulonodular opacities in the right lung. IMPRESSION: Reticulonodular opacities in the right lung may indicate infection. Electronically Signed   By: Ulyses Jarred M.D.   On: 08/09/2021 01:22   ? ? ? ?Subjective: ?Seen and examined the day of discharge.  Stable no distress.  Sitting up in chair eating breakfast.  Stable for discharge home ? ?Discharge Exam: ?Vitals:  ? 08/10/21 0700 08/10/21 1000  ?BP: (!) 148/95 117/83  ?Pulse: 84 78  ?Resp: 18 18  ?Temp: 98.4 ?F (36.9 ?C) 98.6 ?F (37 ?C)  ?SpO2: 98% 100%  ? ?Vitals:  ? 08/10/21 0042 08/10/21 0353 08/10/21 0700 08/10/21 1000  ?BP: (!) 162/88 (!) 150/99 (!) 148/95 117/83  ?Pulse: 79 84 84 78  ?Resp: 16 16 18 18   ?Temp: 99.4 ?F (37.4 ?C) 98.4 ?F (36.9 ?C) 98.4 ?F (36.9 ?C) 98.6 ?F (37 ?C)  ?TempSrc: Oral Oral Oral Oral  ?SpO2: 98% 97% 98% 100%  ?Weight:      ?Height:      ? ? ?General: Pt is alert, awake, not in acute distress ?Cardiovascular: RRR, S1/S2 +, no rubs, no gallops ?Respiratory: CTA bilaterally, no wheezing, no rhonchi ?Abdominal: Soft, NT, ND, bowel sounds + ?Extremities: no edema, no cyanosis ? ? ? ?The results of significant diagnostics from this hospitalization (including imaging, microbiology, ancillary and laboratory) are listed below for reference.   ? ? ?  Microbiology: ?Recent Results (from the past 240 hour(s))  ?Resp Panel by RT-PCR (Flu A&B, Covid) Nasopharyngeal Swab     Status: None  ? Collection Time: 08/09/21 12:53 AM  ? Specimen: Nasopharyngeal Swab; Nasopharyngeal(NP) swabs in vial transport medium  ?Result Value Ref Range Status  ? SARS Coronavirus 2 by RT PCR NEGATIVE NEGATIVE Final  ?  Comment: (NOTE) ?SARS-CoV-2 target nucleic acids are NOT DETECTED. ? ?The SARS-CoV-2 RNA is generally detectable in upper respiratory ?specimens during the acute phase of infection. The  lowest ?concentration of SARS-CoV-2 viral copies this assay can detect is ?138 copies/mL. A negative result does not preclude SARS-Cov-2 ?infection and should not be used as the sole basis for treatment or ?other patient m

## 2021-08-12 LAB — LEGIONELLA PNEUMOPHILA SEROGP 1 UR AG: L. pneumophila Serogp 1 Ur Ag: NEGATIVE

## 2021-09-14 ENCOUNTER — Encounter: Payer: Self-pay | Admitting: *Deleted

## 2021-09-14 ENCOUNTER — Emergency Department: Payer: Medicare Other

## 2021-09-14 ENCOUNTER — Other Ambulatory Visit: Payer: Self-pay

## 2021-09-14 ENCOUNTER — Inpatient Hospital Stay
Admission: EM | Admit: 2021-09-14 | Discharge: 2021-09-17 | DRG: 291 | Disposition: A | Discharge status: Home Health | Payer: Medicare Other | Attending: Internal Medicine | Admitting: Internal Medicine

## 2021-09-14 DIAGNOSIS — R0602 Shortness of breath: Secondary | ICD-10-CM | POA: Diagnosis present

## 2021-09-14 DIAGNOSIS — R42 Dizziness and giddiness: Secondary | ICD-10-CM

## 2021-09-14 DIAGNOSIS — I5043 Acute on chronic combined systolic (congestive) and diastolic (congestive) heart failure: Secondary | ICD-10-CM | POA: Diagnosis present

## 2021-09-14 DIAGNOSIS — E1169 Type 2 diabetes mellitus with other specified complication: Secondary | ICD-10-CM | POA: Diagnosis present

## 2021-09-14 DIAGNOSIS — E782 Mixed hyperlipidemia: Secondary | ICD-10-CM | POA: Diagnosis present

## 2021-09-14 DIAGNOSIS — Z9842 Cataract extraction status, left eye: Secondary | ICD-10-CM

## 2021-09-14 DIAGNOSIS — I69322 Dysarthria following cerebral infarction: Secondary | ICD-10-CM

## 2021-09-14 DIAGNOSIS — I248 Other forms of acute ischemic heart disease: Secondary | ICD-10-CM | POA: Diagnosis present

## 2021-09-14 DIAGNOSIS — Z7984 Long term (current) use of oral hypoglycemic drugs: Secondary | ICD-10-CM

## 2021-09-14 DIAGNOSIS — I110 Hypertensive heart disease with heart failure: Secondary | ICD-10-CM | POA: Diagnosis not present

## 2021-09-14 DIAGNOSIS — I69320 Aphasia following cerebral infarction: Secondary | ICD-10-CM

## 2021-09-14 DIAGNOSIS — I482 Chronic atrial fibrillation, unspecified: Secondary | ICD-10-CM | POA: Diagnosis present

## 2021-09-14 DIAGNOSIS — Z86718 Personal history of other venous thrombosis and embolism: Secondary | ICD-10-CM

## 2021-09-14 DIAGNOSIS — Z961 Presence of intraocular lens: Secondary | ICD-10-CM | POA: Diagnosis present

## 2021-09-14 DIAGNOSIS — F17210 Nicotine dependence, cigarettes, uncomplicated: Secondary | ICD-10-CM | POA: Diagnosis present

## 2021-09-14 DIAGNOSIS — I5042 Chronic combined systolic (congestive) and diastolic (congestive) heart failure: Secondary | ICD-10-CM | POA: Diagnosis present

## 2021-09-14 DIAGNOSIS — Z720 Tobacco use: Secondary | ICD-10-CM | POA: Diagnosis present

## 2021-09-14 DIAGNOSIS — E876 Hypokalemia: Secondary | ICD-10-CM | POA: Diagnosis present

## 2021-09-14 DIAGNOSIS — Z8546 Personal history of malignant neoplasm of prostate: Secondary | ICD-10-CM

## 2021-09-14 DIAGNOSIS — R7989 Other specified abnormal findings of blood chemistry: Secondary | ICD-10-CM | POA: Diagnosis present

## 2021-09-14 DIAGNOSIS — Z79899 Other long term (current) drug therapy: Secondary | ICD-10-CM

## 2021-09-14 DIAGNOSIS — T502X5A Adverse effect of carbonic-anhydrase inhibitors, benzothiadiazides and other diuretics, initial encounter: Secondary | ICD-10-CM | POA: Diagnosis present

## 2021-09-14 DIAGNOSIS — I10 Essential (primary) hypertension: Secondary | ICD-10-CM | POA: Diagnosis present

## 2021-09-14 DIAGNOSIS — Z7901 Long term (current) use of anticoagulants: Secondary | ICD-10-CM

## 2021-09-14 DIAGNOSIS — Z833 Family history of diabetes mellitus: Secondary | ICD-10-CM

## 2021-09-14 DIAGNOSIS — I160 Hypertensive urgency: Secondary | ICD-10-CM | POA: Diagnosis present

## 2021-09-14 DIAGNOSIS — I509 Heart failure, unspecified: Secondary | ICD-10-CM

## 2021-09-14 DIAGNOSIS — M109 Gout, unspecified: Secondary | ICD-10-CM | POA: Diagnosis present

## 2021-09-14 DIAGNOSIS — N179 Acute kidney failure, unspecified: Secondary | ICD-10-CM | POA: Diagnosis present

## 2021-09-14 DIAGNOSIS — E1142 Type 2 diabetes mellitus with diabetic polyneuropathy: Secondary | ICD-10-CM | POA: Diagnosis present

## 2021-09-14 DIAGNOSIS — Z9841 Cataract extraction status, right eye: Secondary | ICD-10-CM

## 2021-09-14 DIAGNOSIS — R778 Other specified abnormalities of plasma proteins: Secondary | ICD-10-CM | POA: Diagnosis present

## 2021-09-14 DIAGNOSIS — D649 Anemia, unspecified: Secondary | ICD-10-CM | POA: Diagnosis present

## 2021-09-14 LAB — CBC
HCT: 37.8 % — ABNORMAL LOW (ref 39.0–52.0)
Hemoglobin: 11.3 g/dL — ABNORMAL LOW (ref 13.0–17.0)
MCH: 24.1 pg — ABNORMAL LOW (ref 26.0–34.0)
MCHC: 29.9 g/dL — ABNORMAL LOW (ref 30.0–36.0)
MCV: 80.8 fL (ref 80.0–100.0)
Platelets: 153 10*3/uL (ref 150–400)
RBC: 4.68 MIL/uL (ref 4.22–5.81)
RDW: 18.5 % — ABNORMAL HIGH (ref 11.5–15.5)
WBC: 4 10*3/uL (ref 4.0–10.5)
nRBC: 0 % (ref 0.0–0.2)

## 2021-09-14 LAB — BASIC METABOLIC PANEL
Anion gap: 11 (ref 5–15)
BUN: 14 mg/dL (ref 8–23)
CO2: 26 mmol/L (ref 22–32)
Calcium: 9.1 mg/dL (ref 8.9–10.3)
Chloride: 103 mmol/L (ref 98–111)
Creatinine, Ser: 1.19 mg/dL (ref 0.61–1.24)
GFR, Estimated: >60 mL/min (ref >60)
Glucose, Bld: 97 mg/dL (ref 70–99)
Potassium: 3.4 mmol/L — ABNORMAL LOW (ref 3.5–5.1)
Sodium: 140 mmol/L (ref 135–145)

## 2021-09-14 LAB — BRAIN NATRIURETIC PEPTIDE: B Natriuretic Peptide: 661.8 pg/mL — ABNORMAL HIGH (ref 0.0–100.0)

## 2021-09-14 LAB — TROPONIN I (HIGH SENSITIVITY)
Troponin I (High Sensitivity): 24 ng/L — ABNORMAL HIGH (ref <18)
Troponin I (High Sensitivity): 23 ng/L — ABNORMAL HIGH (ref <18)

## 2021-09-14 MED ORDER — LOSARTAN POTASSIUM 50 MG PO TABS
50.0000 mg | ORAL_TABLET | ORAL | Status: AC
  Administered 2021-09-14: 50 mg via ORAL
  Filled 2021-09-14: qty 1

## 2021-09-14 MED ORDER — FUROSEMIDE 10 MG/ML IJ SOLN
60.0000 mg | INTRAMUSCULAR | Status: AC
  Administered 2021-09-14: 60 mg via INTRAVENOUS
  Filled 2021-09-14: qty 8

## 2021-09-14 MED ORDER — AMLODIPINE BESYLATE 5 MG PO TABS
5.0000 mg | ORAL_TABLET | Freq: Once | ORAL | Status: AC
  Administered 2021-09-14: 5 mg via ORAL
  Filled 2021-09-14: qty 1

## 2021-09-14 NOTE — ED Provider Notes (Signed)
? ?[facility] ?Provider Note ? ? ? Event Date/Time  ? First MD Initiated Contact with Patient 09/14/21 2124   ?  (approximate) ? ? ?History  ? ?Shortness of Breath ? ? ?HPI ? ?[name] is a 71 y.o. male   who on my review of discharge summary from March 26 this year history of hypertension, atrial fibrillation on Eliquis, nicotine dependence, systolic and diastolic congestive heart failure, CVA with residual aphasia in 2021  ? ?Patient reports his shortness of breath for about 2 days.  No chest pain.  Said similar symptoms in the past, saw Dr. Elana Alm and it felt like he might be having too much fluid on his lungs.  Was referred from [facility] to the ED ? ?Symptoms are worse with exertion and laying down.  Some shortness of breath at rest, ? ?He takes his medications including his blood thinner, but reports that he had not taken Lasix for a while though he supposed to.  He resumed that this morning thinking that he was having too much fluid and has also noticed swelling in his legs and feet over the last few days as well ? ?  ? ? ?Physical Exam  ? ?Triage Vital Signs: ?ED Triage Vitals  ?Enc Vitals Group  ?   BP 09/14/21 1749 (!) 178/113  ?   Pulse Rate 09/14/21 1749 70  ?   Resp 09/14/21 1749 20  ?   Temp 09/14/21 1749 98.7 ?F (37.1 ?C)  ?   Temp Source 09/14/21 1749 Oral  ?   SpO2 09/14/21 1749 100 %  ?   Weight 09/14/21 1746 236 lb (107 kg)  ?   Height 09/14/21 1746 6\' 2"  (1.88 m)  ?   Head Circumference --   ?   Peak Flow --   ?   Pain Score 09/14/21 1746 8  ?   Pain Loc --   ?   Pain Edu? --   ?   Excl. in [place]? --   ? ? ?Most recent vital signs: ?Vitals:  ? 09/14/21 2130 09/14/21 2315  ?BP: (!) 187/95 (!) 192/92  ?Pulse: 63 (!) 51  ?Resp: (!) 21 20  ?Temp:    ?SpO2: 96% 99%  ? ? ? ?General: Awake, no distress.  Pleasant. ?CV:  Good peripheral perfusion.  Normal heart tones, slightly irregular rhythm ?Resp:  Normal effort.  Diminished lungs in the bases, faint crackles denoted.  No  wheezing.  Work of breathing appears unlabored normal rate.  No noted accessory muscle use.  Speaks in phrases ?Abd:  No distention.  ?Other:  Bilateral lower extremity pitting edema to mid shins to the feet bilateral ? ? ?ED Results / Procedures / Treatments  ? ?Labs ?(all labs ordered are listed, but only abnormal results are displayed) ?Labs Reviewed  ?BASIC METABOLIC PANEL - Abnormal; Notable for the following components:  ?    Result Value  ? Potassium 3.4 (*)   ? All other components within normal limits  ?CBC - Abnormal; Notable for the following components:  ? Hemoglobin 11.3 (*)   ? HCT 37.8 (*)   ? MCH 24.1 (*)   ? MCHC 29.9 (*)   ? RDW 18.5 (*)   ? All other components within normal limits  ?BRAIN NATRIURETIC PEPTIDE - Abnormal; Notable for the following components:  ? B Natriuretic Peptide 661.8 (*)   ? All other components within normal limits  ?TROPONIN I (HIGH SENSITIVITY) - Abnormal; Notable for  the following components:  ? Troponin I (High Sensitivity) 24 (*)   ? All other components within normal limits  ?TROPONIN I (HIGH SENSITIVITY) - Abnormal; Notable for the following components:  ? Troponin I (High Sensitivity) 23 (*)   ? All other components within normal limits  ?PROCALCITONIN  ? ? ? ?EKG ? ?Reviewed inter by me at 1802 ?Occasional pattern of bigeminy, mild nonspecific T wave abnormality, frequent PVCs.  No STEMI.  Mild ST segment depression noted in lateral precordial leads. ? ? ?RADIOLOGY ?DG Chest 2 View ? ?Result Date: 09/14/2021 ?CLINICAL DATA:  Short of breath, dizziness, cough EXAM: CHEST - 2 VIEW COMPARISON:  08/09/2021 FINDINGS: Frontal and lateral views of the chest demonstrate an enlarged cardiac silhouette. Stable ectasia of the thoracic aorta. Mild chronic central vascular congestion without focal airspace disease. Trace right pleural effusion. No pneumothorax. No acute bony abnormalities. IMPRESSION: 1. Mild residual central vascular congestion without overt edema. Improved  volume status since prior exam. 2. Trace right pleural effusion. 3. Stable cardiomegaly. Electronically Signed   By: Randa Ngo M.D.   On: 09/14/2021 19:52   ? ? ?Chest x-ray personally viewed and interpreted by me.  Notable cardiomegaly.  Vascular congestion.  Trace right pleural effusion ? ? ? ?PROCEDURES: ? ?Critical Care performed: No ? ?Procedures ? ? ?MEDICATIONS ORDERED IN ED: ?Medications  ?furosemide (LASIX) injection 60 mg (60 mg Intravenous Given 09/14/21 2254)  ?amLODipine (NORVASC) tablet 5 mg (5 mg Oral Given 09/14/21 2347)  ?losartan (COZAAR) tablet 50 mg (50 mg Oral Given 09/14/21 2347)  ? ? ? ?IMPRESSION / MDM / ASSESSMENT AND PLAN / ED COURSE  ?I reviewed the triage vital signs and the nursing notes. ?             ?               ? ?Differential diagnosis includes, but is not limited to, suspect volume overload possible pulmonary edema.  Labs show elevated BNP.  Minimally elevated troponin that is consistent in keeping with previous.  No chest pain clinical exam and EKG are not consistent with acute ACS.  CBC with mild anemia.  Metabolic panel normal except for slight hypokalemia.  No fever, no noted findings that would be highly suggestive of underlying infection such as pneumonia. ? ?The patient is on the cardiac monitor to evaluate for evidence of arrhythmia and/or significant heart rate changes. ? ?Known A-fib.  No chest pain.  Patient compliant with oral anticoagulation therapy, low risk for pulmonary embolism based on clinical history and known anticoagulation status ? ?Clinical Course as of 09/15/21 0024  ?Mon Sep 14, 2021  ?2323 Patient urinating well.  Reports he has been up to urinate 3 times in the last hour.  Currently ambulating without distress.  We will continue to observe for improvement, if continues to do well anticipate likely discharge. [MQ]  ?  ?Clinical Course User Index ?[MQ] Delman Kitten, MD  ? ?Admission discussed with Dr. Girard Cooter, the hospitalist.  After discussing and  consulting with the hospitalist decision made to admit for further care and management.  Patient has diuresed well after Lasix, and shortness of breath has improved however his blood pressure remains quite elevated and given this patient appears to have hypertensive urgency associated with CHF findings.  I will admit for further care and management.  Patient and his wife both understanding very agreeable with this plan for admission ? ?Procalcitonin remains pending at time of admission decision, but  based on clinical history and information obtained at this point suspect unlikely represent infectious etiology and most likely hypertensive urgency with CHF and volume overload current working diagnosis ? ?FINAL CLINICAL IMPRESSION(S) / ED DIAGNOSES  ? ?Final diagnoses:  ?Hypertensive urgency  ?Acute on chronic congestive heart failure, unspecified heart failure type ([place])  ? ? ? ?Rx / DC Orders  ? ?ED Discharge Orders   ? ? None  ? ?  ? ? ? ?Note:  This document was prepared using Dragon voice recognition software and may include unintentional dictation errors. ?  Delman Kitten, MD ?09/15/21 0024 ? ?

## 2021-09-14 NOTE — ED Triage Notes (Signed)
Pt sent from [place] drew for eval of sob and dizziness.    Sx for 2 days.  Pt has a cough . No chest pain.  Pt ambulating with a cane.  Pt alert  speech clear.  ?

## 2021-09-15 ENCOUNTER — Encounter: Payer: Self-pay | Admitting: Internal Medicine

## 2021-09-15 ENCOUNTER — Observation Stay: Payer: Medicare Other

## 2021-09-15 ENCOUNTER — Inpatient Hospital Stay (HOSPITAL_COMMUNITY)
Admit: 2021-09-15 | Discharge: 2021-09-15 | Disposition: A | Discharge status: Home / Self Care | Payer: Medicare Other | Attending: Internal Medicine | Admitting: Internal Medicine

## 2021-09-15 DIAGNOSIS — Z9841 Cataract extraction status, right eye: Secondary | ICD-10-CM | POA: Diagnosis not present

## 2021-09-15 DIAGNOSIS — I5021 Acute systolic (congestive) heart failure: Secondary | ICD-10-CM | POA: Diagnosis not present

## 2021-09-15 DIAGNOSIS — Z9842 Cataract extraction status, left eye: Secondary | ICD-10-CM | POA: Diagnosis not present

## 2021-09-15 DIAGNOSIS — I248 Other forms of acute ischemic heart disease: Secondary | ICD-10-CM | POA: Diagnosis present

## 2021-09-15 DIAGNOSIS — N179 Acute kidney failure, unspecified: Secondary | ICD-10-CM | POA: Diagnosis present

## 2021-09-15 DIAGNOSIS — E876 Hypokalemia: Secondary | ICD-10-CM | POA: Diagnosis present

## 2021-09-15 DIAGNOSIS — I160 Hypertensive urgency: Secondary | ICD-10-CM | POA: Diagnosis present

## 2021-09-15 DIAGNOSIS — I110 Hypertensive heart disease with heart failure: Secondary | ICD-10-CM | POA: Diagnosis present

## 2021-09-15 DIAGNOSIS — E782 Mixed hyperlipidemia: Secondary | ICD-10-CM | POA: Diagnosis present

## 2021-09-15 DIAGNOSIS — R0602 Shortness of breath: Secondary | ICD-10-CM | POA: Diagnosis present

## 2021-09-15 DIAGNOSIS — M109 Gout, unspecified: Secondary | ICD-10-CM | POA: Diagnosis present

## 2021-09-15 DIAGNOSIS — D649 Anemia, unspecified: Secondary | ICD-10-CM | POA: Diagnosis present

## 2021-09-15 DIAGNOSIS — Z79899 Other long term (current) drug therapy: Secondary | ICD-10-CM | POA: Diagnosis not present

## 2021-09-15 DIAGNOSIS — I5043 Acute on chronic combined systolic (congestive) and diastolic (congestive) heart failure: Secondary | ICD-10-CM | POA: Diagnosis present

## 2021-09-15 DIAGNOSIS — F17210 Nicotine dependence, cigarettes, uncomplicated: Secondary | ICD-10-CM | POA: Diagnosis present

## 2021-09-15 DIAGNOSIS — R778 Other specified abnormalities of plasma proteins: Secondary | ICD-10-CM | POA: Diagnosis not present

## 2021-09-15 DIAGNOSIS — Z8546 Personal history of malignant neoplasm of prostate: Secondary | ICD-10-CM | POA: Diagnosis not present

## 2021-09-15 DIAGNOSIS — E1142 Type 2 diabetes mellitus with diabetic polyneuropathy: Secondary | ICD-10-CM | POA: Diagnosis present

## 2021-09-15 DIAGNOSIS — Z833 Family history of diabetes mellitus: Secondary | ICD-10-CM | POA: Diagnosis not present

## 2021-09-15 DIAGNOSIS — I69322 Dysarthria following cerebral infarction: Secondary | ICD-10-CM | POA: Diagnosis not present

## 2021-09-15 DIAGNOSIS — I69320 Aphasia following cerebral infarction: Secondary | ICD-10-CM | POA: Diagnosis not present

## 2021-09-15 DIAGNOSIS — I482 Chronic atrial fibrillation, unspecified: Secondary | ICD-10-CM | POA: Diagnosis present

## 2021-09-15 DIAGNOSIS — Z86718 Personal history of other venous thrombosis and embolism: Secondary | ICD-10-CM | POA: Diagnosis not present

## 2021-09-15 DIAGNOSIS — Z7984 Long term (current) use of oral hypoglycemic drugs: Secondary | ICD-10-CM | POA: Diagnosis not present

## 2021-09-15 DIAGNOSIS — Z961 Presence of intraocular lens: Secondary | ICD-10-CM | POA: Diagnosis present

## 2021-09-15 DIAGNOSIS — Z7901 Long term (current) use of anticoagulants: Secondary | ICD-10-CM | POA: Diagnosis not present

## 2021-09-15 DIAGNOSIS — T502X5A Adverse effect of carbonic-anhydrase inhibitors, benzothiadiazides and other diuretics, initial encounter: Secondary | ICD-10-CM | POA: Diagnosis present

## 2021-09-15 LAB — CBC
HCT: 34.7 % — ABNORMAL LOW (ref 39.0–52.0)
Hemoglobin: 10.7 g/dL — ABNORMAL LOW (ref 13.0–17.0)
MCH: 24.3 pg — ABNORMAL LOW (ref 26.0–34.0)
MCHC: 30.8 g/dL (ref 30.0–36.0)
MCV: 78.7 fL — ABNORMAL LOW (ref 80.0–100.0)
Platelets: 143 10*3/uL — ABNORMAL LOW (ref 150–400)
RBC: 4.41 MIL/uL (ref 4.22–5.81)
RDW: 18.4 % — ABNORMAL HIGH (ref 11.5–15.5)
WBC: 3.7 10*3/uL — ABNORMAL LOW (ref 4.0–10.5)
nRBC: 0 % (ref 0.0–0.2)

## 2021-09-15 LAB — COMPREHENSIVE METABOLIC PANEL
ALT: 20 U/L (ref 0–44)
AST: 26 U/L (ref 15–41)
Albumin: 3.4 g/dL — ABNORMAL LOW (ref 3.5–5.0)
Alkaline Phosphatase: 84 U/L (ref 38–126)
Anion gap: 10 (ref 5–15)
BUN: 15 mg/dL (ref 8–23)
CO2: 30 mmol/L (ref 22–32)
Calcium: 8.9 mg/dL (ref 8.9–10.3)
Chloride: 101 mmol/L (ref 98–111)
Creatinine, Ser: 1.12 mg/dL (ref 0.61–1.24)
GFR, Estimated: >60 mL/min (ref >60)
Glucose, Bld: 107 mg/dL — ABNORMAL HIGH (ref 70–99)
Potassium: 2.7 mmol/L — CL (ref 3.5–5.1)
Sodium: 141 mmol/L (ref 135–145)
Total Bilirubin: 0.9 mg/dL (ref 0.3–1.2)
Total Protein: 6.9 g/dL (ref 6.5–8.1)

## 2021-09-15 LAB — GLUCOSE, CAPILLARY
Glucose-Capillary: 134 mg/dL — ABNORMAL HIGH (ref 70–99)
Glucose-Capillary: 95 mg/dL (ref 70–99)

## 2021-09-15 LAB — ECHOCARDIOGRAM COMPLETE
AR max vel: 3.34 cm2
AV Area VTI: 3.4 cm2
AV Area mean vel: 3.55 cm2
AV Mean grad: 2 mmHg
AV Peak grad: 3.6 mmHg
Ao pk vel: 0.94 m/s
Area-P 1/2: 3.58 cm2
Height: 74 in
MV VTI: 1.88 cm2
S' Lateral: 3.69 cm
Weight: 3776 oz

## 2021-09-15 LAB — TYPE AND SCREEN
ABO/RH(D): A POS
Antibody Screen: NEGATIVE

## 2021-09-15 LAB — PROCALCITONIN: Procalcitonin: <0.1 ng/mL

## 2021-09-15 LAB — PHOSPHORUS: Phosphorus: 4.1 mg/dL (ref 2.5–4.6)

## 2021-09-15 LAB — CBG MONITORING, ED: Glucose-Capillary: 108 mg/dL — ABNORMAL HIGH (ref 70–99)

## 2021-09-15 MED ORDER — ACETAMINOPHEN 650 MG RE SUPP: 650.0000 mg | Freq: Four times a day (QID) | RECTAL | Status: DC | PRN

## 2021-09-15 MED ORDER — FUROSEMIDE 10 MG/ML IJ SOLN: 20.0000 mg | Freq: Two times a day (BID) | INTRAMUSCULAR | Status: DC

## 2021-09-15 MED ORDER — ATORVASTATIN CALCIUM 20 MG PO TABS: 80.0000 mg | ORAL_TABLET | Freq: Every evening | ORAL | Status: DC

## 2021-09-15 MED ORDER — PANTOPRAZOLE SODIUM 40 MG IV SOLR
40.0000 mg | Freq: Two times a day (BID) | INTRAVENOUS | Status: DC
  Administered 2021-09-15 – 2021-09-17 (×5): 40 mg via INTRAVENOUS
  Filled 2021-09-15 (×5): qty 10

## 2021-09-15 MED ORDER — ASPIRIN EC 81 MG PO TBEC
81.0000 mg | DELAYED_RELEASE_TABLET | Freq: Every day | ORAL | Status: DC
  Administered 2021-09-15 – 2021-09-17 (×2): 81 mg via ORAL
  Filled 2021-09-15 (×3): qty 1

## 2021-09-15 MED ORDER — SODIUM CHLORIDE 0.9% FLUSH
3.0000 mL | Freq: Two times a day (BID) | INTRAVENOUS | Status: DC
  Administered 2021-09-15 – 2021-09-17 (×5): 3 mL via INTRAVENOUS

## 2021-09-15 MED ORDER — GABAPENTIN 400 MG PO CAPS
400.0000 mg | ORAL_CAPSULE | Freq: Three times a day (TID) | ORAL | Status: DC
  Administered 2021-09-15 – 2021-09-17 (×7): 400 mg via ORAL
  Filled 2021-09-15 (×7): qty 1

## 2021-09-15 MED ORDER — LOSARTAN POTASSIUM 50 MG PO TABS
50.0000 mg | ORAL_TABLET | Freq: Every day | ORAL | Status: DC
Start: 2021-09-15 — End: 2021-09-17
  Administered 2021-09-15 – 2021-09-17 (×3): 50 mg via ORAL
  Filled 2021-09-15 (×3): qty 1

## 2021-09-15 MED ORDER — BISACODYL 5 MG PO TBEC: 5.0000 mg | DELAYED_RELEASE_TABLET | Freq: Every day | ORAL | Status: DC | PRN

## 2021-09-15 MED ORDER — ONDANSETRON HCL 4 MG/2ML IJ SOLN: 4.0000 mg | Freq: Four times a day (QID) | INTRAMUSCULAR | Status: DC | PRN

## 2021-09-15 MED ORDER — POLYETHYLENE GLYCOL 3350 17 G PO PACK: 17.0000 g | PACK | Freq: Every day | ORAL | Status: DC | PRN

## 2021-09-15 MED ORDER — POTASSIUM CHLORIDE CRYS ER 20 MEQ PO TBCR
40.0000 meq | EXTENDED_RELEASE_TABLET | ORAL | Status: AC
  Administered 2021-09-15 (×2): 40 meq via ORAL
  Filled 2021-09-15 (×2): qty 2

## 2021-09-15 MED ORDER — POTASSIUM CHLORIDE 10 MEQ/100ML IV SOLN
10.0000 meq | INTRAVENOUS | Status: AC
  Administered 2021-09-15: 10 meq via INTRAVENOUS
  Filled 2021-09-15: qty 100

## 2021-09-15 MED ORDER — INSULIN ASPART 100 UNIT/ML IJ SOLN: 0.0000 [IU] | Freq: Three times a day (TID) | INTRAMUSCULAR | Status: DC

## 2021-09-15 MED ORDER — HYDRALAZINE HCL 20 MG/ML IJ SOLN: 5.0000 mg | INTRAMUSCULAR | Status: DC | PRN

## 2021-09-15 MED ORDER — ONDANSETRON HCL 4 MG PO TABS: 4.0000 mg | ORAL_TABLET | Freq: Four times a day (QID) | ORAL | Status: DC | PRN

## 2021-09-15 MED ORDER — METOPROLOL SUCCINATE ER 25 MG PO TB24
12.5000 mg | ORAL_TABLET | Freq: Every day | ORAL | Status: DC
Start: 2021-09-15 — End: 2021-09-17
  Administered 2021-09-15 – 2021-09-17 (×3): 12.5 mg via ORAL
  Filled 2021-09-15: qty 0.5
  Filled 2021-09-15 (×2): qty 1

## 2021-09-15 MED ORDER — NICOTINE 14 MG/24HR TD PT24
14.0000 mg | MEDICATED_PATCH | Freq: Every day | TRANSDERMAL | Status: DC
  Filled 2021-09-15: qty 1

## 2021-09-15 MED ORDER — LATANOPROST 0.005 % OP SOLN: 1.0000 [drp] | Freq: Every day | OPHTHALMIC | Status: DC

## 2021-09-15 MED ORDER — ALLOPURINOL 100 MG PO TABS
300.0000 mg | ORAL_TABLET | Freq: Every day | ORAL | Status: DC
  Administered 2021-09-15 – 2021-09-17 (×3): 300 mg via ORAL
  Filled 2021-09-15: qty 3
  Filled 2021-09-15: qty 1
  Filled 2021-09-15: qty 3

## 2021-09-15 MED ORDER — APIXABAN 5 MG PO TABS
5.0000 mg | ORAL_TABLET | Freq: Two times a day (BID) | ORAL | Status: DC
  Administered 2021-09-15 – 2021-09-17 (×5): 5 mg via ORAL
  Filled 2021-09-15 (×5): qty 1

## 2021-09-15 MED ORDER — ACETAMINOPHEN 325 MG PO TABS
650.0000 mg | ORAL_TABLET | Freq: Four times a day (QID) | ORAL | Status: DC | PRN
  Administered 2021-09-16 – 2021-09-17 (×3): 650 mg via ORAL
  Filled 2021-09-15 (×3): qty 2

## 2021-09-15 MED ORDER — AMLODIPINE BESYLATE 5 MG PO TABS
5.0000 mg | ORAL_TABLET | Freq: Every day | ORAL | Status: DC
  Administered 2021-09-15 – 2021-09-17 (×3): 5 mg via ORAL
  Filled 2021-09-15 (×3): qty 1

## 2021-09-15 MED ORDER — METOPROLOL TARTRATE 25 MG PO TABS: 12.5000 mg | ORAL_TABLET | Freq: Every day | ORAL | Status: DC

## 2021-09-15 MED ORDER — DOCUSATE SODIUM 100 MG PO CAPS
100.0000 mg | ORAL_CAPSULE | Freq: Two times a day (BID) | ORAL | Status: DC
  Administered 2021-09-15 – 2021-09-17 (×4): 100 mg via ORAL
  Filled 2021-09-15 (×5): qty 1

## 2021-09-15 MED ORDER — EMPAGLIFLOZIN 10 MG PO TABS
10.0000 mg | ORAL_TABLET | Freq: Every day | ORAL | Status: DC
Start: 2021-09-15 — End: 2021-09-17
  Administered 2021-09-15 – 2021-09-17 (×3): 10 mg via ORAL
  Filled 2021-09-15 (×3): qty 1

## 2021-09-15 MED ORDER — NITROGLYCERIN 2 % TD OINT
0.5000 [in_us] | TOPICAL_OINTMENT | Freq: Once | TRANSDERMAL | Status: AC
  Administered 2021-09-15: 0.5 [in_us] via TOPICAL
  Filled 2021-09-15: qty 1

## 2021-09-15 MED ORDER — LATANOPROST 0.005 % OP SOLN
1.0000 [drp] | Freq: Every day | OPHTHALMIC | Status: DC
  Administered 2021-09-16: 1 [drp] via OPHTHALMIC
  Filled 2021-09-15: qty 2.5

## 2021-09-15 NOTE — Evaluation (Signed)
Physical Therapy Evaluation ?Patient Details ?Name: [name] ?MRN: [id number] ?DOB: [birth year] ?Today's Date: 09/15/2021 ? ?History of Present Illness ? 71 y.o. male   who on my review of discharge summary from March 26 this year history of hypertension, atrial fibrillation on Eliquis, nicotine dependence, systolic and diastolic congestive heart failure, CVA with residual aphasia in 2021  ?Clinical Impression ? Pt sitting at EOB finishing breakfast on arrival.  He reports feeling close to his baseline and states that he is feeling much better with no shortness of breath.  PT checked SpO2 multiple times during rest and activity t/o session, always in the 90s with high 90s at rest and mid 90s during activity, no DOE or signs of excessive fatigue.  Pt not overly reliant on the walker, but clearly using it for some stability.  Pt states that he had recently started having HHPT coming to to home working on general strength/gait/balance activities, appropriate to continue with this at discharge, no PT reasons that he would be unable to return home with girlfriend and continue HHPT. ?   ? ?Recommendations for follow up therapy are one component of a multi-disciplinary discharge planning process, led by the attending physician.  Recommendations may be updated based on patient status, additional functional criteria and insurance authorization. ? ?Follow Up Recommendations Home health PT (pt reports HHPT services having recently started pre hospitalization - appropriate to continue) ? ?  ?Assistance Recommended at Discharge PRN  ?Patient can return home with the following ?   ? ?  ?Equipment Recommendations Rolling walker (2 wheels)  ?Recommendations for Other Services ?    ?  ?Functional Status Assessment Patient has not had a recent decline in their functional status  ? ?  ?Precautions / Restrictions Precautions ?Precautions: None ?Restrictions ?Weight Bearing Restrictions: No  ? ?  ? ?Mobility ? Bed Mobility ?Overal  bed mobility: Modified Independent ?  ?  ?  ?  ?  ?  ?  ?  ? ?Transfers ?Overall transfer level: Modified independent ?Equipment used: Straight cane ?  ?Sit to Stand: Modified independent (Device/Increase time) ?  ?  ?  ?  ?  ?  ?  ? ?Ambulation/Gait ?Ambulation/Gait assistance: Supervision ?Gait Distance (Feet): 300 Feet ?Assistive device: Straight cane ?  ?  ?  ?  ?General Gait Details: Pt was able to able easily and confidently circumambulate the ED desk with consistent speed and cadence. No LOBs or overt safety issues - O2 remains in the 90s on room air t/o the session ? ?Stairs ?  ?  ?  ?  ?  ? ?Wheelchair Mobility ?  ? ?Modified Rankin (Stroke Patients Only) ?  ? ?  ? ?Balance Overall balance assessment: Modified Independent ?  ?  ?  ?  ?  ?  ?  ?  ?  ?  ?  ?  ?  ?  ?  ?  ?  ?  ?   ? ? ? ?Pertinent Vitals/Pain Pain Assessment ?Pain Assessment: No/denies pain  ? ? ?Home Living Family/patient expects to be discharged to:: Private residence ?Living Arrangements: Spouse/significant other ?Available Help at Discharge: Friend(s);Available PRN/intermittently ?Type of Home: Apartment ?Home Access: Level entry ?  ?  ?  ?Home Layout: One level ?Home Equipment: Cane - single point;BSC/3in1 ?   ?  ?Prior Function Prior Level of Function : Independent/Modified Independent ?  ?  ?  ?  ?  ?  ?Mobility Comments: Pt reports he's mod I with SPC,  denies falls - states he is out of the house/driving almost daily ?  ?  ? ? ?Hand Dominance  ?   ? ?  ?Extremity/Trunk Assessment  ? Upper Extremity Assessment ?Upper Extremity Assessment: Overall WFL for tasks assessed;Generalized weakness ?  ? ?Lower Extremity Assessment ?Lower Extremity Assessment: Overall WFL for tasks assessed;Generalized weakness ?  ? ?   ?Communication  ? Communication: HOH;Other (comment) (baseline slurred speech)  ?Cognition Arousal/Alertness: Awake/alert ?Behavior During Therapy: [facility] for tasks assessed/performed ?Overall Cognitive Status: Within Functional  Limits for tasks assessed ?  ?  ?  ?  ?  ?  ?  ?  ?  ?  ?  ?  ?  ?  ?  ?  ?  ?  ?  ? ?  ?General Comments   ? ?  ?Exercises    ? ?Assessment/Plan  ?  ?PT Assessment Patient needs continued PT services  ?PT Problem List Decreased activity tolerance;Decreased balance;Decreased safety awareness;Decreased strength;Cardiopulmonary status limiting activity ? ?   ?  ?PT Treatment Interventions Functional mobility training;DME instruction;Gait training;Therapeutic activities;Therapeutic exercise;Balance training;Patient/family education   ? ?PT Goals (Current goals can be found in the Care Plan section)  ?Acute Rehab PT Goals ?Patient Stated Goal: go home ?PT Goal Formulation: With patient ?Time For Goal Achievement: 09/29/21 ?Potential to Achieve Goals: Good ? ?  ?Frequency Min 2X/week ?  ? ? ?Co-evaluation   ?  ?  ?  ?  ? ? ?  ?AM-PAC PT "6 Clicks" Mobility  ?Outcome Measure Help needed turning from your back to your side while in a flat bed without using bedrails?: None ?Help needed moving from lying on your back to sitting on the side of a flat bed without using bedrails?: None ?Help needed moving to and from a bed to a chair (including a wheelchair)?: None ?Help needed standing up from a chair using your arms (e.g., wheelchair or bedside chair)?: None ?Help needed to walk in hospital room?: None ?Help needed climbing 3-5 steps with a railing? : A Little ?6 Click Score: 23 ? ?  ?End of Session Equipment Utilized During Treatment: Gait belt ?Activity Tolerance: Patient tolerated treatment well ?Patient left: in bed;with call bell/phone within reach ?Nurse Communication: Mobility status ?PT Visit Diagnosis: Difficulty in walking, not elsewhere classified (R26.2);Unsteadiness on feet (R26.81);Muscle weakness (generalized) (M62.81) ?  ? ?Time: 6761-9509 ?PT Time Calculation (min) (ACUTE ONLY): 28 min ? ? ?Charges:   PT Evaluation ?$PT Eval Low Complexity: 1 Low ?  ?  ?   ? ? ?Kreg Shropshire, DPT ?09/15/2021, 3:09 PM ? ?

## 2021-09-15 NOTE — ED Notes (Signed)
Lab called with critical K+ 2.7, will update attending MD  ?

## 2021-09-15 NOTE — ED Notes (Signed)
PT at bedside.

## 2021-09-15 NOTE — Progress Notes (Signed)
Admission profile updated. ?

## 2021-09-15 NOTE — Progress Notes (Signed)
[name] is a 71 y.o. 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  He presented to the hospital with shortness of breath, dizziness and lower extremity swelling.  He was admitted to the hospital for acute exacerbation of chronic systolic and diastolic CHF.  He is feeling better but is not quite back to baseline.  Continue IV Lasix and monitor BMP, daily weight and urine output.  Replete potassium for hypokalemia.  Continue home medications for comorbidities. ? ?

## 2021-09-15 NOTE — Evaluation (Signed)
Occupational Therapy Evaluation ?Patient Details ?Name: [name] ?MRN: [id number] ?DOB: 04/24/1951 ?Today's Date: 09/15/2021 ? ? ?History of Present Illness 71 y.o. male   who on my review of discharge summary from March 26 this year history of hypertension, atrial fibrillation on Eliquis, nicotine dependence, systolic and diastolic congestive heart failure, CVA with residual aphasia in 2021  ? ?Clinical Impression ?  ?Upon entering the room, pt supine in bed with no c/o pain and reports feeling much better with no SOB this session. He is agreeable to OT evaluation. Pt reports living at home alone in an apartment with use of [facility] for functional mobility and self care. Pt has someone that comes on the weekends to assist with IADL tasks.  He performs bed mobility and self care tasks without assistance in room with use of SPC. Pt reports feeling back to baseline. All needs within reach and pt returned to bed. OT to SIGN OFF with pt in agreement. No further skilled need at this time.  ?   ? ?Recommendations for follow up therapy are one component of a multi-disciplinary discharge planning process, led by the attending physician.  Recommendations may be updated based on patient status, additional functional criteria and insurance authorization.  ? ?Follow Up Recommendations ? No OT follow up  ?  ?Assistance Recommended at Discharge None  ?   ?Functional Status Assessment ? Patient has not had a recent decline in their functional status  ?Equipment Recommendations ? None recommended by OT  ?  ?   ?Precautions / Restrictions Precautions ?Precautions: None  ? ?  ? ?Mobility Bed Mobility ?Overal bed mobility: Modified Independent ?  ?  ?  ?  ?  ?  ?General bed mobility comments: no physical assistance provided from ED stretcher ?  ? ?Transfers ?Overall transfer level: Modified independent ?Equipment used: Straight cane ?Transfers: Sit to/from Stand, Bed to chair/wheelchair/BSC ?Sit to Stand: Modified independent  (Device/Increase time) ?Stand pivot transfers: Modified independent (Device/Increase time) ?  ?Step pivot transfers: Modified independent (Device/Increase time) ?  ?  ?  ?  ? ?  ?Balance Overall balance assessment: Modified Independent ?  ?  ?  ?  ?  ?  ?  ?  ?  ?  ?  ?  ?  ?  ?  ?  ?  ?  ?   ? ?ADL either performed or assessed with clinical judgement  ? ?ADL Overall ADL's : At baseline ?  ?  ?  ?  ?  ?  ?  ?  ?  ?  ?  ?  ?  ?  ?  ?  ?  ?  ?  ?   ? ? ? ?Vision Patient Visual Report: No change from baseline ?   ?   ?   ?   ? ?Pertinent Vitals/Pain Pain Assessment ?Pain Assessment: No/denies pain  ? ? ? ?Hand Dominance Right ?  ?Extremity/Trunk Assessment Upper Extremity Assessment ?Upper Extremity Assessment: Overall WFL for tasks assessed ?  ?Lower Extremity Assessment ?Lower Extremity Assessment: Overall WFL for tasks assessed ?  ?  ?  ?Communication Communication ?Communication: HOH;Expressive difficulties;Other (comment) (slurred speech at baseline) ?  ?Cognition Arousal/Alertness: Awake/alert ?Behavior During Therapy: [facility] for tasks assessed/performed ?Overall Cognitive Status: Within Functional Limits for tasks assessed ?  ?  ?  ?  ?  ?  ?  ?  ?  ?  ?  ?  ?  ?  ?  ?  ?  ?  ?  ?   ?   ?   ? ? ?  Home Living Family/patient expects to be discharged to:: Private residence ?Living Arrangements: Alone ?Available Help at Discharge: Friend(s);Available PRN/intermittently ?Type of Home: Apartment ?Home Access: Level entry ?  ?  ?Home Layout: One level ?  ?  ?Bathroom Shower/Tub: Tub/shower unit ?  ?Bathroom Toilet: Standard ?  ?  ?Home Equipment: Cane - single point;BSC/3in1 ?  ?  ?  ? ?  ?Prior Functioning/Environment Prior Level of Function : Independent/Modified Independent ?  ?  ?  ?  ?  ?  ?Mobility Comments: Pt reports he's mod I with SPC, denies falls. ?ADLs Comments: He has someone that comes on the weekends to assist with IADL tasks ?  ? ?  ?  ?   ?   ?   ?OT Goals(Current goals can be found in the care plan  section) Acute Rehab OT Goals ?Patient Stated Goal: to go home ?OT Goal Formulation: With patient ?Time For Goal Achievement: 09/15/21 ?Potential to Achieve Goals: Good  ?OT Frequency:   ?  ? ?   ?AM-PAC OT "6 Clicks" Daily Activity     ?Outcome Measure Help from another person eating meals?: None ?Help from another person taking care of personal grooming?: None ?Help from another person toileting, which includes using toliet, bedpan, or urinal?: None ?Help from another person bathing (including washing, rinsing, drying)?: None ?Help from another person to put on and taking off regular upper body clothing?: None ?Help from another person to put on and taking off regular lower body clothing?: None ?6 Click Score: 24 ?  ?End of Session Equipment Utilized During Treatment: Other (comment) [facility]) ?Nurse Communication: Mobility status ? ?Activity Tolerance: Patient tolerated treatment well ?Patient left: in bed;with call bell/phone within reach ? ?   ?              ?Time: 2683-4196 ?OT Time Calculation (min): 11 min ?Charges:  OT General Charges ?$OT Visit: 1 Visit ?OT Evaluation ?$OT Eval Low Complexity: 1 Low ? ?Darleen Crocker, MS, OTR/L , CBIS ?ascom [phone]  ?09/15/21, 11:09 AM  ?

## 2021-09-15 NOTE — Consult Note (Addendum)
? ?  Heart Failure Nurse Navigator Note ? ?HFrEF 40-45%.  Mild LVH.  Grade I diastolic dysfunction.  Mild bi atrial enlargement.  Mild mitral regurgitation.  Mild aortic insufficiency.  Echocardiogram performed on this admission results are pending. ? ?He presented to the emergency room from home with complaints of shortness of breath, orthopnea, lower extremity edema and dizziness for 2 days. ? ?Comorbidities: ? ?Prostate cancer ?Hypertension ?Gout ?Chronic atrial fibrillation on NOAC ?Tobacco abuse ?Pulmonary embolus ?Diabetes ?Stroke ? ?Medications: ? ?Metoprolol succinate 12.5 mg daily ?Amlodipine 5 mg daily ?Apixaban 5 mg twice a day ?Aspirin 81 mg daily ?Jardiance 10 mg daily ?NicoDerm patch 14 mg daily ?Furosemide 20 mg IV twice a day ? ?Labs: ? ?BNP 661, hemoglobin 10.7, hematocrit 34.7, sodium 141, potassium 2.7, chloride 101, CO2 30, BUN 15, creatinine 1.12, GFR greater than 60. ?Weight is 107 kg ?Blood pressure 143/93 ? ?Initial meeting with patient in the emergency room.  Sitting on the edge of the bed eating lunch. ? ?Patient states that he lives by himself but he has a girlfriend and family that does his meal preparation.  States that he does eat bacon,egg biscuit  every morning with breakfast.  But states that he does not add salt to his food.  Discussed reading labels and taking healthier choices of low sodium foods. ? ?Also discussed the importance of fluid restriction and drinking no more than 64 ounces in a days time.  He states that he drinks 2 bottles of water daily that are 16.9 ounce also has juice, coffee in the morning and will drink tea at lunch but did not feel that he went over the 64 ounces daily. ? ?Stressed the importance of daily weights and what to report.  Along with changes in symptoms to report. ? 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  He states that he did not graduate from high school. ? ?He states that paying  for his medications is not a  problem. ? ?Discussed follow up in the outpatient heart failure clinic.  Has an appointment for May 15 at 3 PM.  He has a 13% no-show for appointments which is 46 out of 366. ? ?Pricilla Riffle RN [facility] ?
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
## 2021-09-16 DIAGNOSIS — R778 Other specified abnormalities of plasma proteins: Secondary | ICD-10-CM

## 2021-09-16 DIAGNOSIS — I482 Chronic atrial fibrillation, unspecified: Secondary | ICD-10-CM

## 2021-09-16 LAB — GLUCOSE, CAPILLARY
Glucose-Capillary: 103 mg/dL — ABNORMAL HIGH (ref 70–99)
Glucose-Capillary: 94 mg/dL (ref 70–99)
Glucose-Capillary: 103 mg/dL — ABNORMAL HIGH (ref 70–99)
Glucose-Capillary: 103 mg/dL — ABNORMAL HIGH (ref 70–99)

## 2021-09-16 LAB — BASIC METABOLIC PANEL
Anion gap: 7 (ref 5–15)
BUN: 15 mg/dL (ref 8–23)
CO2: 29 mmol/L (ref 22–32)
Calcium: 8.9 mg/dL (ref 8.9–10.3)
Chloride: 102 mmol/L (ref 98–111)
Creatinine, Ser: 1.23 mg/dL (ref 0.61–1.24)
GFR, Estimated: >60 mL/min (ref >60)
Glucose, Bld: 116 mg/dL — ABNORMAL HIGH (ref 70–99)
Potassium: 3.6 mmol/L (ref 3.5–5.1)
Sodium: 138 mmol/L (ref 135–145)

## 2021-09-16 LAB — MAGNESIUM: Magnesium: 1.7 mg/dL (ref 1.7–2.4)

## 2021-09-16 MED ORDER — FUROSEMIDE 10 MG/ML IJ SOLN
40.0000 mg | Freq: Two times a day (BID) | INTRAMUSCULAR | Status: DC
  Administered 2021-09-16 (×2): 40 mg via INTRAVENOUS
  Filled 2021-09-16 (×2): qty 4

## 2021-09-16 NOTE — Hospital Course (Signed)
[name] is a 71 y.o. male with medical history significant of prostate cancer, hypertension, gout, chronic A-fib on Eliquis, nicotine dependence, combined systolic and diastolic congestive heart failure,.  History of pulmonary embolism and DVT on Eliquis, diabetes mellitus type 2, history of stroke presenting with shortness of breath.   ?She is diagnosed with acute on chronic combined systolic diastolic congestive heart failure, started IV Lasix. ?

## 2021-09-16 NOTE — Progress Notes (Signed)
Nutrition Brief Note ? ?RD consulted for assessment of nutritional requirements and status.  ? ?Wt Readings from Last 15 Encounters:  ?09/16/21 96 kg  ?08/09/21 100.3 kg  ?02/04/21 101 kg  ?11/18/20 96.2 kg  ?11/04/20 99.3 kg  ?11/03/20 99.1 kg  ?07/23/20 102.1 kg  ?07/09/20 102.1 kg  ?06/10/20 98.9 kg  ?05/21/20 102.1 kg  ?03/06/20 99.8 kg  ?01/28/20 97.9 kg  ?09/18/19 92.6 kg  ?09/12/19 97.1 kg  ?06/29/19 91.6 kg  ? ?[name] is a 71 y.o. male with medical history significant of prostate cancer, hypertension, gout, chronic A-fib on Eliquis, nicotine dependence, combined systolic and diastolic congestive heart failure, pulmonary embolism and DVT on Eliquis, diabetes mellitus type 2, history of stroke presenting with shortness of breath. ? ?Pt admitted with CHF exacerbation.  ? ?Reviewed I/O's: +43 ml x 24 hours ? ?UOP: 200 ml x 24 hours ? ?Spoke with pt at bedside, who was pleasant and in good spirits today. Pt has a good appetite and consumed 100% of his breakfast. PTA he reports consuming 3 meals per day. He reports his girlfriend prepares his meals and does not add salt to his food.  ? ?Pt denies weight loss. He reports his UBW is around 220#. Pt does not have a scale at home, but Heart Failure RN has provided one for him for home use.  ? ?Reviewed patient's dietary recall. Provided examples on ways to decrease sodium intake in diet. Discouraged intake of processed foods and use of salt shaker. Encouraged fresh fruits and vegetables as well as whole grain sources of carbohydrates to maximize fiber intake.  ? ?RD discussed why it is important for patient to adhere to diet recommendations, and emphasized the role of fluids, foods to avoid, and importance of weighing self daily. Teach back method used. ? ?Expect fair to good compliance. ? ?Nutrition-Focused physical exam completed. Findings are no fat depletion, no muscle depletion, and mild edema.   ? ?Assisted pt with reading menu and ordering dinner.   ? ?Current diet order is Heart Healthy/ carb modified with 1.5 L fluid restriction, patient is consuming approximately 100% of meals at this time. Labs and medications reviewed.  ? ?No nutrition interventions warranted at this time. If nutrition issues arise, please consult RD.  ? ?Loistine Chance, RD, LDN, CDCES ?Registered Dietitian II ?Certified Diabetes Care and Education Specialist ?Please refer to [facility] for RD and/or RD on-call/weekend/after hours pager   ?

## 2021-09-16 NOTE — Progress Notes (Signed)
?  Progress Note ? ? ?PatientLonnie [name] IOM:[id number] DOB: [birth year] DOA: 09/14/2021     1 ?DOS: the patient was seen and examined on 09/16/2021 ?  ?Brief hospital course: ?[name] is a 71 y.o. male with medical history significant of prostate cancer, hypertension, gout, chronic A-fib on Eliquis, nicotine dependence, combined systolic and diastolic congestive heart failure,.  History of pulmonary embolism and DVT on Eliquis, diabetes mellitus type 2, history of stroke presenting with shortness of breath.   ?She is diagnosed with acute on chronic combined systolic diastolic congestive heart failure, started IV Lasix. ? ?Assessment and Plan: ?Acute on chronic combined diastolic and systolic congestive heart failure. ?Hypertension urgency. ?Elevated troponin secondary to congestive heart failure. ?Patient had a significant short of breath at time of admission, elevated BNP level and chest x-ray showed vascular congestion. ?I will continue diuretics, increase Lasix to 40 mg twice a day. ?Patient can be discharged home tomorrow. ? ?Chronic atrial fibrillation. ?History of stroke with dysarthria. ?Heart rate under control, continue anticoagulation. ? ?Type 2 diabetes with diabetic polyneuropathy  ?Continue sliding scale insulin. ? ?Hypokalemia. ?Improved.  Recheck level tomorrow. ? ? ?  ? ?Subjective:  ?Patient doing better today, short of breath is improving.  No chest pain ? ?Physical Exam: ?Vitals:  ? 09/16/21 0220 09/16/21 0806 09/16/21 1149 09/16/21 1155  ?BP: (!) 144/98 (!) 158/92  (!) 126/96  ?Pulse: 68 69  90  ?Resp: 19 16  16   ?Temp: 98.2 ?F (36.8 ?C) 97.8 ?F (36.6 ?C)  97.8 ?F (36.6 ?C)  ?TempSrc:      ?SpO2: 96% 99%  96%  ?Weight:   96 kg   ?Height:      ? ?General exam: Appears calm and comfortable  ?Respiratory system: Clear to auscultation. Respiratory effort normal. ?Cardiovascular system: S1 & S2 heard, RRR. No JVD, murmurs, rubs, gallops or clicks. No pedal edema. ?Gastrointestinal system:  Abdomen is nondistended, soft and nontender. No organomegaly or masses felt. Normal bowel sounds heard. ?Central nervous system: Alert and oriented. No focal neurological deficits. ?Extremities: Symmetric 5 x 5 power. ?Skin: No rashes, lesions or ulcers ?Psychiatry: Judgement and insight appear normal. Mood & affect appropriate.  ? ?Data Reviewed: ? ?Reviewed prior echocardiogram results, all lab results ? ?Family Communication:  ? ?Disposition: ?Status is: Inpatient ?Remains inpatient appropriate because: Severity of disease, IV Lasix. ? Planned Discharge Destination: Home with Home Health ? ? ? ?Time spent: 28 minutes ? ?Author: Sharen Hones, MD ?09/16/2021 2:03 PM ? ?For on call review [email or web address].  ?

## 2021-09-16 NOTE — Progress Notes (Signed)
? ?  Heart Failure Nurse Navigator Note ? ?Met with patient today, he was up ambulating about the room in no acute distress.  No SOB noted with talking.  He states he is feeling better. ? ?By teach back method discussed daily weights, fluid restriction etc.  Needs very little assistance. ? ?Asked if he had looked at the heart failure materials.  He admitted that he does not read very well.  He states he only went to the 10 th grade. ? ?He lives with his girlfriend of 63 years. ? ?He also stated he does not have a scale, instructed that I would supply him with one.Supplied one from [place]. ? ?He had no further questions. ? ?Pricilla Riffle RN CHFN ?

## 2021-09-16 NOTE — TOC Initial Note (Addendum)
Transition of Care (TOC) - Initial/Assessment Note  ? ? ?Patient Details  ?Name: [name] ?MRN: [id number] ?Date of Birth: [birth year] ? ?Transition of Care (TOC) CM/SW Contact:    ?Laurena Slimmer, RN ?Phone Number: ?09/16/2021, 2:48 PM ? ?Clinical Narrative:                 ?Patient from is from home . Attempted TOC assessment. Patient was working with PT. Patient requested his girlfriend, [name] be called. Patient is active for HHPT with [facility]. [employer] notified.  ? ?Admitted for: Shortness of breath r/t CHF ?Admitted from:Home ?DJS:HFWYOV [place] [place] ?Pharmacy: [facility] ?Current home health/prior home health/DME: [facility] ? ?  ?  ? ? ?Patient Goals and CMS Choice ?  ?  ?  ? ?Expected Discharge Plan and Services ?  ?  ?  ?  ?  ?                ?  ?  ?  ?  ?  ?  ?  ?  ?  ?  ? ?Prior Living Arrangements/Services ?  ?  ?  ?       ?  ?  ?  ?  ? ?Activities of Daily Living ?Home Assistive Devices/Equipment: Cane (specify quad or straight) ?ADL Screening (condition at time of admission) ?Patient's cognitive ability adequate to safely complete daily activities?: Yes ?Is the patient deaf or have difficulty hearing?: No ?Does the patient have difficulty seeing, even when wearing glasses/contacts?: No ?Does the patient have difficulty concentrating, remembering, or making decisions?: No ?Patient able to express need for assistance with ADLs?: Yes ?Does the patient have difficulty dressing or bathing?: No ?Independently performs ADLs?: Yes (appropriate for developmental age) ?Does the patient have difficulty walking or climbing stairs?: No ?Weakness of Legs: None ?Weakness of Arms/Hands: None ? ?Permission Sought/Granted ?  ?  ?   ?   ?   ?   ? ?Emotional Assessment ?  ?  ?  ?  ?  ?  ? ?Admission diagnosis:  SOB (shortness of breath) [R06.02] ?Hypertensive urgency [I16.0] ?Acute on chronic combined systolic and diastolic CHF (congestive heart failure) ([place]) [I50.43] ?Acute on chronic  congestive heart failure, unspecified heart failure type ([place]) [I50.9] ?Patient Active Problem List  ? Diagnosis Date Noted  ? SOB (shortness of breath) 09/15/2021  ? Hypertensive urgency 09/15/2021  ? Pneumonia of right lower lobe due to infectious organism 08/09/2021  ? Elevated troponin 01/28/2020  ? UTI (urinary tract infection) 01/28/2020  ? Hypokalemia 01/28/2020  ? Diarrhea 01/28/2020  ? Chest pain 01/28/2020  ? Acute on chronic combined systolic and diastolic CHF (congestive heart failure) ([place]) 01/28/2020  ? Acute metabolic encephalopathy 78/58/8502  ? Coagulation disorder ([place]) 11/29/2019  ? AKI (acute kidney injury) ([place])   ? History of cocaine abuse ([place])   ? Acute ischemic right PCA stroke ([place]) 09/13/2019  ? Vision loss, bilateral   ? Tobacco abuse   ? Bradycardia   ? Non-sustained ventricular tachycardia ([place])   ? Dizziness 09/12/2019  ? Nonintractable headache   ? Slurred speech   ? Mixed diabetic hyperlipidemia associated with type 2 diabetes mellitus ([place])   ? Finger joint swelling, right 06/27/2019  ? Bilateral hand pain 06/14/2019  ? PAD (peripheral artery disease) ([place]) 05/20/2019  ? Encounter for long-term (current) use of high-risk medication 04/30/2019  ? Atrial fibrillation, chronic ([place]) 04/08/2019  ? Fever 04/08/2019  ? Severe sepsis ([place]) 04/08/2019  ? Abdominal pain  04/07/2019  ? 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 Polyarthralgia 03/05/2019  ? Thrombocytopenia ([place]) 03/05/2019  ? Pain due to onychomycosis of toenails of both feet 11/20/2018  ? Diabetic neuropathy ([place]) 11/20/2018  ? Influenza A 06/22/2018  ? Community acquired pneumonia 06/22/2018  ? Arthritis 05/12/2017  ? Varicose veins of leg with swelling, bilateral 01/18/2017  ? Facial twitching   ? 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 ([place]) 02/24/2015  ? Pneumonia 10/12/2014  ? Osteoarthritis of right knee 02/11/2014  ? Pain and swelling of knee 01/08/2014  ? Pain in extremity 01/08/2014  ? Weakness due  to cerebrovascular accident 01/08/2014  ? Pulmonary embolism ([place]) 06/19/2012  ? Gout 09/22/2011  ? CVA, old, speech/language deficit 09/01/2011  ? Cerebral infarction ([place]) 04/26/2011  ? Type 2 diabetes mellitus with diabetic polyneuropathy, without long-term current use of insulin ([place]) 04/26/2011  ? Accelerated hypertension 04/26/2011  ? Anemia 04/26/2011  ? DVT (deep venous thrombosis) ([place]) 04/26/2011  ? Essential hypertension 04/26/2011  ? ?PCP:  Theotis Burrow, MD ?Pharmacy:   ?[facility] ([facility]), [place] ?[address] ([place]) [place] [address] ?Phone: [phone] Fax: [phone] ? ? ? ? ?Social Determinants of Health (SDOH) Interventions ?  ? ?Readmission Risk Interventions ? ?  01/29/2020  ? 12:41 PM  ?Readmission Risk Prevention Plan  ?Transportation Screening Complete  ?Palliative Care Screening Not Applicable  ?Medication Review Press photographer) Complete  ? ? ? ?

## 2021-09-16 NOTE — Progress Notes (Signed)
Physical Therapy Treatment ?Patient Details ?Name: [name] ?MRN: [id number] ?DOB: [birth year] ?Today's Date: 09/16/2021 ? ? ?History of Present Illness 71 y.o. male here with shortness of breath.  History of hypertension, atrial fibrillation on Eliquis, nicotine dependence, systolic and diastolic congestive heart failure, CVA with residual aphasia in 2021 ? ?  ?PT Comments  ? ? Pt eager to get up and do some activity and did quite well.  He was able to circumambulate the nurses' station X 2 and negotiate up/down steps w/o assist and w/o real safety concerns.  He used the [facility] t/o the effort and while he certainly needed it he was not overly reliant on it.  He does endorse that with very prolonged activity he becomes more tired (cites needing to use grocery cart like a walker).  Pt's HR and O2 remains stable and appropriate t/o the effort.    ?Recommendations for follow up therapy are one component of a multi-disciplinary discharge planning process, led by the attending physician.  Recommendations may be updated based on patient status, additional functional criteria and insurance authorization. ? ?Follow Up Recommendations ? Home health PT ?  ?  ?Assistance Recommended at Discharge PRN  ?Patient can return home with the following   ?  ?Equipment Recommendations ? Rolling walker (2 wheels)  ?  ?Recommendations for Other Services   ? ? ?  ?Precautions / Restrictions Precautions ?Precautions: None;Fall ?Restrictions ?Weight Bearing Restrictions: No  ?  ? ?Mobility ? Bed Mobility ?Overal bed mobility: Modified Independent ?  ?  ?  ?  ?  ?  ?General bed mobility comments: no physical assistance, easily gets to sitting EOB ?  ? ?Transfers ?Overall transfer level: Modified independent ?Equipment used: Straight cane ?Transfers: Sit to/from Stand ?Sit to Stand: Modified independent (Device/Increase time) ?Stand pivot transfers: Modified independent (Device/Increase time) ?  ?  ?  ?  ?General transfer comment: Pt able to  rise and reach for walker with good confidence, and no safety concerns ?  ? ?Ambulation/Gait ?Ambulation/Gait assistance: Supervision ?Gait Distance (Feet): 350 Feet ?Assistive device: Straight cane ?  ?  ?  ?  ?General Gait Details: Pt was able to able easily and confidently circumambulate the nurses' station x 2 with consistent speed and cadence. No LOBs or overt safety issues - O2 remains in the 90s on room air t/o the effort ? ? ?Stairs ?Stairs: Yes ?Stairs assistance: Min guard ?Stair Management: One rail Left, With cane ?Number of Stairs: 5 ?  ? ? ?Wheelchair Mobility ?  ? ?Modified Rankin (Stroke Patients Only) ?  ? ? ?  ?Balance Overall balance assessment: Modified Independent ?  ?  ?  ?  ?  ?  ?  ?  ?  ?  ?  ?  ?  ?  ?  ?  ?  ?  ?  ? ?  ?Cognition Arousal/Alertness: Awake/alert ?Behavior During Therapy: [facility] for tasks assessed/performed ?Overall Cognitive Status: Within Functional Limits for tasks assessed ?  ?  ?  ?  ?  ?  ?  ?  ?  ?  ?  ?  ?  ?  ?  ?  ?  ?  ?  ? ?  ?Exercises   ? ?  ?General Comments   ?  ?  ? ?Pertinent Vitals/Pain Pain Assessment ?Pain Assessment: No/denies pain  ? ? ?Home Living   ?  ?  ?  ?  ?  ?  ?  ?  ?  ?   ?  ?  Prior Function    ?  ?  ?   ? ?PT Goals (current goals can now be found in the care plan section) Progress towards PT goals: Progressing toward goals ? ?  ?Frequency ? ? ? Min 2X/week ? ? ? ?  ?PT Plan Current plan remains appropriate  ? ? ?Co-evaluation   ?  ?  ?  ?  ? ?  ?AM-PAC PT "6 Clicks" Mobility   ?Outcome Measure ? Help needed turning from your back to your side while in a flat bed without using bedrails?: None ?Help needed moving from lying on your back to sitting on the side of a flat bed without using bedrails?: None ?Help needed moving to and from a bed to a chair (including a wheelchair)?: None ?Help needed standing up from a chair using your arms (e.g., wheelchair or bedside chair)?: None ?Help needed to walk in hospital room?: None ?Help needed climbing 3-5  steps with a railing? : None ?6 Click Score: 24 ? ?  ?End of Session Equipment Utilized During Treatment: Gait belt ?Activity Tolerance: Patient tolerated treatment well ?Patient left: in bed;with call bell/phone within reach ?Nurse Communication: Mobility status ?PT Visit Diagnosis: Difficulty in walking, not elsewhere classified (R26.2);Unsteadiness on feet (R26.81);Muscle weakness (generalized) (M62.81) ?  ? ? ?Time: 1735-6701 ?PT Time Calculation (min) (ACUTE ONLY): 14 min ? ?Charges:  $Gait Training: 8-22 mins          ?          ? ?Kreg Shropshire, DPT ?09/16/2021, 3:25 PM ? ?

## 2021-09-17 LAB — GLUCOSE, CAPILLARY: Glucose-Capillary: 108 mg/dL — ABNORMAL HIGH (ref 70–99)

## 2021-09-17 LAB — BASIC METABOLIC PANEL
Anion gap: 9 (ref 5–15)
BUN: 18 mg/dL (ref 8–23)
CO2: 30 mmol/L (ref 22–32)
Calcium: 9 mg/dL (ref 8.9–10.3)
Chloride: 100 mmol/L (ref 98–111)
Creatinine, Ser: 1.45 mg/dL — ABNORMAL HIGH (ref 0.61–1.24)
GFR, Estimated: 51 mL/min — ABNORMAL LOW (ref >60)
Glucose, Bld: 118 mg/dL — ABNORMAL HIGH (ref 70–99)
Potassium: 3.5 mmol/L (ref 3.5–5.1)
Sodium: 139 mmol/L (ref 135–145)

## 2021-09-17 LAB — MAGNESIUM: Magnesium: 1.8 mg/dL (ref 1.7–2.4)

## 2021-09-17 MED ORDER — POTASSIUM CHLORIDE 20 MEQ PO PACK
40.0000 meq | PACK | Freq: Once | ORAL | Status: AC
  Administered 2021-09-17: 40 meq via ORAL
  Filled 2021-09-17: qty 2

## 2021-09-17 MED ORDER — JARDIANCE 10 MG PO TABS: 10.0000 mg | ORAL_TABLET | Freq: Every day | ORAL | 0 refills | Status: DC

## 2021-09-17 NOTE — Progress Notes (Signed)
? ?  Heart Failure Nurse Navigator Note ? ?Met with patient who was lying in bed in no acute distress and his girlfriend of 22 years [name]. ? ?Went over the importance of daily weights, fluid restriction and eating low-sodium foods and not using salt at the table.  Also sticking with fluid restriction of 64 ounces or less.  ? ?[name] states that she does not live with him but that he does check on him daily and sets of his medications along with making his meals. ? ?He has an appointment in the outpatient heart failure clinic on May 15 at 3 PM. ? ?They had no further questions. ? ?Pricilla Riffle RN [facility] ?

## 2021-09-17 NOTE — TOC Transition Note (Addendum)
Transition of Care (TOC) - CM/SW Discharge Note ? ? ?Patient Details  ?Name: [name] ?MRN: [id number] ?Date of Birth: 1951-01-07 ? ?Transition of Care (TOC) CM/SW Contact:  ?Laurena Slimmer, RN ?Phone Number: ?09/17/2021, 10:09 AM ? ? ?Clinical Narrative:    ?Discharge order received. [facility] representative notified. TOC signing off ? ? ?  ?  ? ? ?Patient Goals and CMS Choice ?  ?  ?  ? ?Discharge Placement ?  ?           ?  ?  ?  ?  ? ?Discharge Plan and Services ?  ?  ?           ?  ?  ?  ?  ?  ?  ?  ?  ?  ?  ? ?Social Determinants of Health (SDOH) Interventions ?  ? ? ?Readmission Risk Interventions ? ?  01/29/2020  ? 12:41 PM  ?Readmission Risk Prevention Plan  ?Transportation Screening Complete  ?Palliative Care Screening Not Applicable  ?Medication Review Press photographer) Complete  ? ? ? ? ? ?

## 2021-09-17 NOTE — Discharge Summary (Signed)
?Physician Discharge Summary ?  ?Patient: [name] MRN: [id number] DOB: 10/11/1950  ?Admit date:     09/14/2021  ?Discharge date: 09/17/21  ?Discharge Physician: Sharen Hones  ? ?PCP: Theotis Burrow, MD  ? ?Recommendations at discharge:  ? ?Follow-up with PCP in 1 week. ?Follow-up with heart failure clinic in 1 to 2 weeks ? ?Discharge Diagnoses: ?Principal Problem: ?  SOB (shortness of breath) ?Active Problems: ?  Acute on chronic combined systolic and diastolic CHF (congestive heart failure) ([place]) ?  Elevated troponin ?  Essential hypertension ?  Hypertensive urgency ?  Atrial fibrillation, chronic ([place]) ?  Dizziness ?  Type 2 diabetes mellitus with diabetic polyneuropathy, without long-term current use of insulin ([place]) ?  Anemia ?  Gout ?  Mixed diabetic hyperlipidemia associated with type 2 diabetes mellitus ([place]) ?  Hypokalemia ?  Tobacco abuse ? ?Resolved Problems: ?  * No resolved hospital problems. * ? ?Hospital Course: ?[name] is a 71 y.o. male with medical history significant of prostate cancer, hypertension, gout, chronic A-fib on Eliquis, nicotine dependence, combined systolic and diastolic congestive heart failure,.  History of pulmonary embolism and DVT on Eliquis, diabetes mellitus type 2, history of stroke presenting with shortness of breath.   ?She is diagnosed with acute on chronic combined systolic diastolic congestive heart failure, started IV Lasix. ? ?Assessment and Plan: ?Acute on chronic combined diastolic and systolic congestive heart failure. ?Hypertension urgency. ?Elevated troponin secondary to congestive heart failure. ?Patient had a significant short of breath at time of admission, elevated BNP level and chest x-ray showed vascular congestion. ?Did not is treated with IV Lasix, condition much improved. ?Patient be followed by PCP and the heart failure clinic in the near future. ?  ?Chronic atrial fibrillation. ?History of stroke with dysarthria. ?Heart rate  under control, continue anticoagulation. ?  ?Type 2 diabetes with diabetic polyneuropathy  ?Resume home regimen ? ?Hypokalemia. ?Acute kidney injury secondary to diuretics. ?Potassium normalized at 3.5 today, will give 40 mill equivalent oral potassium before discharge. ?He also had a creatinine of 1.45 after diuretics.  IV Lasix discontinued, resume home dose oral Lasix.  Please follow-up on the BMP at next office visit ?  ? ? ?  ? ? ?Consultants: None ?Procedures performed: None  ?Disposition: Home ?Diet recommendation:  ?Discharge Diet Orders (From admission, onward)  ? ?  Start     Ordered  ? 09/17/21 0000  Diet - low sodium heart healthy       ? 09/17/21 5681  ? ?  ?  ? ?  ? ?Cardiac diet ?DISCHARGE MEDICATION: ?Allergies as of 09/17/2021   ?No Known Allergies ?  ? ?  ?Medication List  ?  ? ?STOP taking these medications   ? ?atorvastatin 80 MG tablet ?Commonly known as: LIPITOR ?  ?simvastatin 20 MG tablet ?Commonly known as: ZOCOR ?  ? ?  ? ?TAKE these medications   ? ?acetaminophen 325 MG tablet ?Commonly known as: TYLENOL ?Take 1-2 tablets (325-650 mg total) by mouth every 4 (four) hours as needed for mild pain. ?  ?albuterol 108 (90 Base) MCG/ACT inhaler ?Commonly known as: VENTOLIN HFA ?Inhale into the lungs every 6 (six) hours as needed for wheezing or shortness of breath. ?  ?allopurinol 300 MG tablet ?Commonly known as: ZYLOPRIM ?Take 1 tablet (300 mg total) by mouth daily. ?  ?apixaban 5 MG Tabs tablet ?Commonly known as: Eliquis ?Take 1 tablet (5 mg total) by mouth 2 (two) times daily. ?  ?  aspirin 81 MG EC tablet ?Take 1 tablet (81 mg total) by mouth daily. ?  ?colchicine 0.6 MG tablet ?Take 1 tablet (0.6 mg total) by mouth daily. Stop taking when your symptoms resolve. ?  ?furosemide 40 MG tablet ?Commonly known as: LASIX ?Take 40 mg by mouth 2 (two) times daily. ?  ?gabapentin 400 MG capsule ?Commonly known as: NEURONTIN ?Take 400 mg by mouth 3 (three) times daily. ?  ?Jardiance 10 MG Tabs  tablet ?Generic drug: empagliflozin ?Take 1 tablet (10 mg total) by mouth daily. ?  ?latanoprost 0.005 % ophthalmic solution ?Commonly known as: XALATAN ?Place 1 drop into both eyes at bedtime. ?  ?losartan 50 MG tablet ?Commonly known as: COZAAR ?Take 50 mg by mouth daily. ?  ?metoprolol succinate 25 MG 24 hr tablet ?Commonly known as: TOPROL-XL ?Take 12.5 mg by mouth daily. ?  ?Spiriva Respimat 1.25 MCG/ACT Aers ?Generic drug: Tiotropium Bromide Monohydrate ?Take 1 spray by mouth daily. ?  ? ?  ? ?  ?  ? ? ?  ?Durable Medical Equipment  ?(From admission, onward)  ?  ? ? ?  ? ?  Start     Ordered  ? 09/16/21 1519  For home use only DME Walker rolling  Once       ?Question Answer Comment  ?Walker: With 5 Inch Wheels   ?Patient needs a walker to treat with the following condition Unsteadiness on feet   ?  ? 09/16/21 1519  ? ?  ?  ? ?  ? ? Follow-up Information   ? ? [place], Elyse Jarvis, MD Follow up in 1 week(s).   ?Specialty: Family Medicine ?Contact information: ?[address] ?[address] ?[place] [address] ?[phone] ? ? ?  ?  ? ? [place] Follow up in 1 week(s).   ?Specialty: Cardiology ?Contact information: ?[address] ?[place] ?[phone] ? ?  ?  ? ?  ?  ? ?  ? ?Discharge Exam: ?Filed Weights  ? 09/16/21 0100 09/16/21 1149 09/17/21 0500  ?Weight: 97.3 kg 96 kg 96.3 kg  ? ?General exam: Appears calm and comfortable  ?Respiratory system: Clear to auscultation. Respiratory effort normal. ?Cardiovascular system: S1 & S2 heard, RRR. No JVD, murmurs, rubs, gallops or clicks. No pedal edema. ?Gastrointestinal system: Abdomen is nondistended, soft and nontender. No organomegaly or masses felt. Normal bowel sounds heard. ?Central nervous system: Alert and oriented. No focal neurological deficits. ?Extremities: Symmetric 5 x 5 power. ?Skin: No rashes, lesions or ulcers ?Psychiatry: Judgement and insight appear normal. Mood  & affect appropriate.  ? ? ?Condition at discharge: good ? ?The results of significant diagnostics from this hospitalization (including imaging, microbiology, ancillary and laboratory) are listed below for reference.  ? ?Imaging Studies: ?DG Chest 2 View ? ?Result Date: 09/14/2021 ?CLINICAL DATA:  Short of breath, dizziness, cough EXAM: CHEST - 2 VIEW COMPARISON:  08/09/2021 FINDINGS: Frontal and lateral views of the chest demonstrate an enlarged cardiac silhouette. Stable ectasia of the thoracic aorta. Mild chronic central vascular congestion without focal airspace disease. Trace right pleural effusion. No pneumothorax. No acute bony abnormalities. IMPRESSION: 1. Mild residual central vascular congestion without overt edema. Improved volume status since prior exam. 2. Trace right pleural effusion. 3. Stable cardiomegaly. Electronically Signed   By: Randa Ngo M.D.   On: 09/14/2021 19:52  ? ?CT HEAD WO CONTRAST (5MM) ? ?Result Date: 09/15/2021 ?CLINICAL DATA:  Weakness and confusion. EXAM: CT  HEAD WITHOUT CONTRAST TECHNIQUE: Contiguous axial images were obtained from the base of the skull through the vertex without intravenous contrast. RADIATION DOSE REDUCTION: This exam was performed according to the departmental dose-optimization program which includes automated exposure control, adjustment of the mA and/or kV according to patient size and/or use of iterative reconstruction technique. COMPARISON:  August 09, 2021 FINDINGS: Brain: There is mild cerebral atrophy with widening of the extra-axial spaces and ventricular dilatation. There are areas of decreased attenuation within the white matter tracts of the supratentorial brain, consistent with microvascular disease changes. Cavum septum pellucidum is noted. Chronic left parietal lobe and chronic bilateral occipital lobe infarcts are seen. Vascular: No hyperdense vessel or unexpected calcification. Skull: Normal. Negative for fracture or focal lesion. Sinuses/Orbits:  No acute finding. Other: None. IMPRESSION: 1. No acute intracranial abnormality. 2. Generalized cerebral atrophy with chronic left parietal lobe and bilateral occipital lobe infarcts. Electronically Signed   By: Darene Lamer
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
## 2021-09-28 ENCOUNTER — Other Ambulatory Visit
Admission: RE | Admit: 2021-09-28 | Discharge: 2021-09-28 | Disposition: A | Discharge status: Home / Self Care | Payer: Medicare Other | Source: Ambulatory Visit | Attending: Family | Admitting: Family

## 2021-09-28 ENCOUNTER — Ambulatory Visit (HOSPITAL_BASED_OUTPATIENT_CLINIC_OR_DEPARTMENT_OTHER): Payer: Medicare Other | Admitting: Family

## 2021-09-28 ENCOUNTER — Encounter: Payer: Self-pay | Admitting: Family

## 2021-09-28 VITALS — BP 96/62 | HR 69 | Resp 20 | Ht 74.0 in | Wt 222.0 lb

## 2021-09-28 DIAGNOSIS — I509 Heart failure, unspecified: Secondary | ICD-10-CM | POA: Insufficient documentation

## 2021-09-28 DIAGNOSIS — I82409 Acute embolism and thrombosis of unspecified deep veins of unspecified lower extremity: Secondary | ICD-10-CM | POA: Insufficient documentation

## 2021-09-28 DIAGNOSIS — Z8546 Personal history of malignant neoplasm of prostate: Secondary | ICD-10-CM | POA: Insufficient documentation

## 2021-09-28 DIAGNOSIS — R0602 Shortness of breath: Secondary | ICD-10-CM | POA: Insufficient documentation

## 2021-09-28 DIAGNOSIS — E119 Type 2 diabetes mellitus without complications: Secondary | ICD-10-CM

## 2021-09-28 DIAGNOSIS — I482 Chronic atrial fibrillation, unspecified: Secondary | ICD-10-CM | POA: Diagnosis not present

## 2021-09-28 DIAGNOSIS — N280 Ischemia and infarction of kidney: Secondary | ICD-10-CM | POA: Insufficient documentation

## 2021-09-28 DIAGNOSIS — I10 Essential (primary) hypertension: Secondary | ICD-10-CM | POA: Diagnosis not present

## 2021-09-28 DIAGNOSIS — F1490 Cocaine use, unspecified, uncomplicated: Secondary | ICD-10-CM | POA: Insufficient documentation

## 2021-09-28 DIAGNOSIS — Z7901 Long term (current) use of anticoagulants: Secondary | ICD-10-CM | POA: Insufficient documentation

## 2021-09-28 DIAGNOSIS — Z8673 Personal history of transient ischemic attack (TIA), and cerebral infarction without residual deficits: Secondary | ICD-10-CM | POA: Insufficient documentation

## 2021-09-28 DIAGNOSIS — I5022 Chronic systolic (congestive) heart failure: Secondary | ICD-10-CM | POA: Diagnosis not present

## 2021-09-28 DIAGNOSIS — M109 Gout, unspecified: Secondary | ICD-10-CM | POA: Insufficient documentation

## 2021-09-28 DIAGNOSIS — I110 Hypertensive heart disease with heart failure: Secondary | ICD-10-CM | POA: Insufficient documentation

## 2021-09-28 DIAGNOSIS — F17200 Nicotine dependence, unspecified, uncomplicated: Secondary | ICD-10-CM | POA: Insufficient documentation

## 2021-09-28 LAB — BASIC METABOLIC PANEL
Anion gap: 10 (ref 5–15)
BUN: 22 mg/dL (ref 8–23)
CO2: 29 mmol/L (ref 22–32)
Calcium: 9.2 mg/dL (ref 8.9–10.3)
Chloride: 101 mmol/L (ref 98–111)
Creatinine, Ser: 1.51 mg/dL — ABNORMAL HIGH (ref 0.61–1.24)
GFR, Estimated: 49 mL/min — ABNORMAL LOW (ref >60)
Glucose, Bld: 143 mg/dL — ABNORMAL HIGH (ref 70–99)
Potassium: 3 mmol/L — ABNORMAL LOW (ref 3.5–5.1)
Sodium: 140 mmol/L (ref 135–145)

## 2021-09-28 NOTE — Patient Instructions (Addendum)
Continue weighing daily and call for an overnight weight gain of 3 pounds or more or a weekly weight gain of more than 5 pounds. ? ? ?If you have voicemail, please make sure your mailbox is cleaned out so that we may leave a message and please make sure to listen to any voicemails.  ? ? ?Bring ALL medications including any over the counter or vitamins to every visit.  ? ? ?

## 2021-09-29 ENCOUNTER — Telehealth: Payer: Self-pay | Admitting: Family

## 2021-09-29 MED ORDER — POTASSIUM CHLORIDE CRYS ER 20 MEQ PO TBCR: 20.0000 meq | EXTENDED_RELEASE_TABLET | Freq: Two times a day (BID) | ORAL | 5 refills | Status: DC

## 2021-09-29 NOTE — Telephone Encounter (Signed)
Called patient and discussed BMP results obtained yesterday. Potassium is low at 3.0. Renal function with slight decline.  ? ?Currently taking furosemide 40mg  BID, jardiance 10mg  QD and losartan 50mg  QD.  ? ?Will add potassium 107meq BID and recheck labs at next visit. Patient verbalized understanding.  ?

## 2021-10-01 ENCOUNTER — Encounter: Payer: Self-pay | Admitting: Podiatry

## 2021-10-01 ENCOUNTER — Ambulatory Visit (INDEPENDENT_AMBULATORY_CARE_PROVIDER_SITE_OTHER): Payer: Medicare Other | Admitting: Podiatry

## 2021-10-01 DIAGNOSIS — E1142 Type 2 diabetes mellitus with diabetic polyneuropathy: Secondary | ICD-10-CM

## 2021-10-01 DIAGNOSIS — M79674 Pain in right toe(s): Secondary | ICD-10-CM | POA: Diagnosis not present

## 2021-10-01 DIAGNOSIS — D689 Coagulation defect, unspecified: Secondary | ICD-10-CM

## 2021-10-01 DIAGNOSIS — B351 Tinea unguium: Secondary | ICD-10-CM | POA: Diagnosis not present

## 2021-10-01 DIAGNOSIS — M79675 Pain in left toe(s): Secondary | ICD-10-CM

## 2021-10-01 DIAGNOSIS — I739 Peripheral vascular disease, unspecified: Secondary | ICD-10-CM

## 2021-10-01 NOTE — Progress Notes (Signed)
This patient returns to my office for at risk foot care.  This patient requires this care by a professional since this patient will be at risk due to having diabetes, history of DVT and PAD.  Patient is taking eliquiss.  This patient is unable to cut nails himself since the patient cannot reach his nails.These nails are painful walking and wearing shoes.  This patient presents for at risk foot care today.  General Appearance  Alert, conversant and in no acute stress.  Vascular  Dorsalis pedis and posterior tibial  pulses are palpable  bilaterally.  Capillary return is within normal limits  bilaterally. Temperature is within normal limits  Bilaterally. Venous stasis legs  B/l.  Neurologic  Senn-Weinstein monofilament wire test diminished   bilaterally. Muscle power within normal limits bilaterally.  Nails Thick disfigured discolored nails with subungual debris  from hallux to fifth toes bilaterally. No evidence of bacterial infection or drainage bilaterally.  Orthopedic  No limitations of motion  feet .  No crepitus or effusions noted.  No bony pathology or digital deformities noted.  DJD midfoot right.  HAV  B/L  Skin  normotropic skin with no porokeratosis noted bilaterally.  No signs of infections or ulcers noted.     Onychomycosis  Pain in right toes  Pain in left toes  Consent was obtained for treatment procedures.   Mechanical debridement of nails 1-5  bilaterally performed with a nail nipper.  Filed with dremel without incident.    Return office visit   3 months                   Told patient to return for periodic foot care and evaluation due to potential at risk complications.   Gardiner Barefoot DPM

## 2021-11-10 ENCOUNTER — Ambulatory Visit: Payer: Medicare Other | Attending: Family | Admitting: Family

## 2021-11-10 ENCOUNTER — Encounter: Payer: Self-pay | Admitting: Family

## 2021-11-10 VITALS — BP 128/70 | HR 68 | Resp 16 | Ht 74.0 in | Wt 224.0 lb

## 2021-11-10 DIAGNOSIS — Z7902 Long term (current) use of antithrombotics/antiplatelets: Secondary | ICD-10-CM | POA: Diagnosis not present

## 2021-11-10 DIAGNOSIS — Z7982 Long term (current) use of aspirin: Secondary | ICD-10-CM | POA: Diagnosis not present

## 2021-11-10 DIAGNOSIS — I482 Chronic atrial fibrillation, unspecified: Secondary | ICD-10-CM

## 2021-11-10 DIAGNOSIS — Z86718 Personal history of other venous thrombosis and embolism: Secondary | ICD-10-CM | POA: Insufficient documentation

## 2021-11-10 DIAGNOSIS — Z86711 Personal history of pulmonary embolism: Secondary | ICD-10-CM | POA: Insufficient documentation

## 2021-11-10 DIAGNOSIS — I6930 Unspecified sequelae of cerebral infarction: Secondary | ICD-10-CM | POA: Diagnosis not present

## 2021-11-10 DIAGNOSIS — F1411 Cocaine abuse, in remission: Secondary | ICD-10-CM | POA: Diagnosis not present

## 2021-11-10 DIAGNOSIS — Z8546 Personal history of malignant neoplasm of prostate: Secondary | ICD-10-CM | POA: Diagnosis not present

## 2021-11-10 DIAGNOSIS — M109 Gout, unspecified: Secondary | ICD-10-CM | POA: Diagnosis not present

## 2021-11-10 DIAGNOSIS — Z7984 Long term (current) use of oral hypoglycemic drugs: Secondary | ICD-10-CM | POA: Diagnosis not present

## 2021-11-10 DIAGNOSIS — F17210 Nicotine dependence, cigarettes, uncomplicated: Secondary | ICD-10-CM | POA: Insufficient documentation

## 2021-11-10 DIAGNOSIS — E119 Type 2 diabetes mellitus without complications: Secondary | ICD-10-CM

## 2021-11-10 DIAGNOSIS — I110 Hypertensive heart disease with heart failure: Secondary | ICD-10-CM | POA: Insufficient documentation

## 2021-11-10 DIAGNOSIS — I5022 Chronic systolic (congestive) heart failure: Secondary | ICD-10-CM | POA: Diagnosis not present

## 2021-11-10 DIAGNOSIS — Z79899 Other long term (current) drug therapy: Secondary | ICD-10-CM | POA: Diagnosis not present

## 2021-11-10 DIAGNOSIS — I4891 Unspecified atrial fibrillation: Secondary | ICD-10-CM | POA: Insufficient documentation

## 2021-11-10 DIAGNOSIS — I10 Essential (primary) hypertension: Secondary | ICD-10-CM | POA: Diagnosis not present

## 2021-11-10 DIAGNOSIS — Z720 Tobacco use: Secondary | ICD-10-CM

## 2021-11-12 ENCOUNTER — Telehealth: Payer: Self-pay

## 2021-11-12 ENCOUNTER — Telehealth: Payer: Self-pay | Admitting: Family

## 2021-11-12 ENCOUNTER — Telehealth: Payer: Self-pay | Admitting: Licensed Clinical Social Worker

## 2021-11-12 NOTE — Telephone Encounter (Signed)
CSW referred to assist patient with obtaining a scale. Patient informed scale will be delivered to home. CSW available as needed. Raquel Sarna, [place], [place]

## 2021-11-12 NOTE — Telephone Encounter (Signed)
Received BMP results from [facility] dated 11/09/21:  Potassium 4.2 Sodium 139 BUN 25 Creatinine 1.42 GFR 53 TSH 0.589 Magnesium 2.1

## 2021-11-12 NOTE — Telephone Encounter (Signed)
Called to inform patient that our social worker was able to order him a talking scale and it will be shipped directly to his home. At patient's last visit earlier in the week he stated he was not weighing because he could not make out the numbers on the scale due to vision problems. Patient voiced appreciation and states he will be on the lookout for the scale to ship to him. Georg Ruddle, RN

## 2021-12-04 ENCOUNTER — Encounter: Payer: Self-pay | Admitting: *Deleted

## 2022-01-11 ENCOUNTER — Encounter: Payer: Self-pay | Admitting: Podiatry

## 2022-01-11 ENCOUNTER — Ambulatory Visit (INDEPENDENT_AMBULATORY_CARE_PROVIDER_SITE_OTHER): Payer: Medicare Other | Admitting: Podiatry

## 2022-01-11 ENCOUNTER — Other Ambulatory Visit: Payer: Self-pay | Admitting: Internal Medicine

## 2022-01-11 DIAGNOSIS — E1142 Type 2 diabetes mellitus with diabetic polyneuropathy: Secondary | ICD-10-CM | POA: Diagnosis not present

## 2022-01-11 DIAGNOSIS — B351 Tinea unguium: Secondary | ICD-10-CM | POA: Diagnosis not present

## 2022-01-11 DIAGNOSIS — I739 Peripheral vascular disease, unspecified: Secondary | ICD-10-CM | POA: Diagnosis not present

## 2022-01-11 DIAGNOSIS — D689 Coagulation defect, unspecified: Secondary | ICD-10-CM

## 2022-01-11 DIAGNOSIS — M79674 Pain in right toe(s): Secondary | ICD-10-CM

## 2022-01-11 DIAGNOSIS — M79675 Pain in left toe(s): Secondary | ICD-10-CM

## 2022-01-11 NOTE — Progress Notes (Signed)
This patient returns to my office for at risk foot care.  This patient requires this care by a professional since this patient will be at risk due to having diabetes, history of DVT and PAD.  Patient is taking eliquiss.  This patient is unable to cut nails himself since the patient cannot reach his nails.These nails are painful walking and wearing shoes.  This patient presents for at risk foot care today.  General Appearance  Alert, conversant and in no acute stress.  Vascular  Dorsalis pedis and posterior tibial  pulses are palpable  bilaterally.  Capillary return is within normal limits  bilaterally. Temperature is within normal limits  Bilaterally. Venous stasis legs  B/l.  Neurologic  Senn-Weinstein monofilament wire test diminished   bilaterally. Muscle power within normal limits bilaterally.  Nails Thick disfigured discolored nails with subungual debris  from hallux to fifth toes bilaterally. No evidence of bacterial infection or drainage bilaterally.  Orthopedic  No limitations of motion  feet .  No crepitus or effusions noted.  No bony pathology or digital deformities noted.  DJD midfoot right.  HAV  B/L  Skin  normotropic skin with no porokeratosis noted bilaterally.  No signs of infections or ulcers noted.     Onychomycosis  Pain in right toes  Pain in left toes  Consent was obtained for treatment procedures.   Mechanical debridement of nails 1-5  bilaterally performed with a nail nipper.  Filed with dremel without incident.    Return office visit   3 months                   Told patient to return for periodic foot care and evaluation due to potential at risk complications.   Gardiner Barefoot DPM

## 2022-01-20 ENCOUNTER — Ambulatory Visit (INDEPENDENT_AMBULATORY_CARE_PROVIDER_SITE_OTHER): Payer: Medicare Other | Admitting: Urology

## 2022-01-20 ENCOUNTER — Telehealth: Payer: Self-pay

## 2022-01-20 ENCOUNTER — Encounter: Payer: Self-pay | Admitting: Urology

## 2022-01-20 ENCOUNTER — Encounter: Payer: Self-pay | Admitting: Internal Medicine

## 2022-01-20 VITALS — BP 115/65 | HR 66 | Ht 74.0 in | Wt 227.0 lb

## 2022-01-20 DIAGNOSIS — Z8546 Personal history of malignant neoplasm of prostate: Secondary | ICD-10-CM | POA: Diagnosis not present

## 2022-01-20 DIAGNOSIS — C61 Malignant neoplasm of prostate: Secondary | ICD-10-CM

## 2022-01-20 DIAGNOSIS — N5203 Combined arterial insufficiency and corporo-venous occlusive erectile dysfunction: Secondary | ICD-10-CM | POA: Diagnosis not present

## 2022-01-20 MED ORDER — SILDENAFIL CITRATE 20 MG PO TABS: ORAL_TABLET | ORAL | 1 refills | Status: DC
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
## 2022-01-20 NOTE — Telephone Encounter (Signed)
Eligard approved via [facility] portal.   Dates: 01/20/22 - 01/21/23  Auth # [id number]

## 2022-01-24 ENCOUNTER — Encounter: Payer: Self-pay | Admitting: Emergency Medicine

## 2022-01-24 ENCOUNTER — Emergency Department
Admission: EM | Admit: 2022-01-24 | Discharge: 2022-01-24 | Disposition: A | Discharge status: Home / Self Care | Payer: Medicare Other | Attending: Emergency Medicine | Admitting: Emergency Medicine

## 2022-01-24 ENCOUNTER — Other Ambulatory Visit: Payer: Self-pay

## 2022-01-24 DIAGNOSIS — Z7901 Long term (current) use of anticoagulants: Secondary | ICD-10-CM | POA: Diagnosis not present

## 2022-01-24 DIAGNOSIS — I5043 Acute on chronic combined systolic (congestive) and diastolic (congestive) heart failure: Secondary | ICD-10-CM | POA: Diagnosis not present

## 2022-01-24 DIAGNOSIS — M79631 Pain in right forearm: Secondary | ICD-10-CM | POA: Diagnosis not present

## 2022-01-24 DIAGNOSIS — F17200 Nicotine dependence, unspecified, uncomplicated: Secondary | ICD-10-CM | POA: Diagnosis not present

## 2022-01-24 DIAGNOSIS — M25521 Pain in right elbow: Secondary | ICD-10-CM

## 2022-01-24 DIAGNOSIS — Z8546 Personal history of malignant neoplasm of prostate: Secondary | ICD-10-CM | POA: Insufficient documentation

## 2022-01-24 DIAGNOSIS — M25511 Pain in right shoulder: Secondary | ICD-10-CM | POA: Insufficient documentation

## 2022-01-24 DIAGNOSIS — E119 Type 2 diabetes mellitus without complications: Secondary | ICD-10-CM | POA: Insufficient documentation

## 2022-01-24 DIAGNOSIS — I110 Hypertensive heart disease with heart failure: Secondary | ICD-10-CM | POA: Diagnosis not present

## 2022-01-24 DIAGNOSIS — M79601 Pain in right arm: Secondary | ICD-10-CM | POA: Diagnosis present

## 2022-01-24 MED ORDER — ACETAMINOPHEN 500 MG PO TABS
1000.0000 mg | ORAL_TABLET | Freq: Once | ORAL | Status: AC
  Administered 2022-01-24: 1000 mg via ORAL
  Filled 2022-01-24: qty 2

## 2022-01-24 MED ORDER — OXYCODONE HCL 5 MG PO TABS: 5.0000 mg | ORAL_TABLET | Freq: Three times a day (TID) | ORAL | 0 refills | Status: AC | PRN

## 2022-01-24 NOTE — Discharge Instructions (Addendum)
I suspect that the pain in your arm is coming from a pinched nerve in your neck.  You can take the oxycodone as needed for pain in addition to taking Tylenol scheduled 1 g every 8 hours.  Please do not drive after you take this medication.  Please follow-up with your primary care provider if your symptoms or not improving.  You may need to have an MRI of the neck if symptoms or not improving.  If you develop weakness in the arm or difficulty walking please return to the emergency department.

## 2022-01-24 NOTE — ED Provider Notes (Signed)
[facility] Provider Note    Event Date/Time   First MD Initiated Contact with Patient 01/24/22 1352     (approximate)   History   Arm Pain   HPI  [name] is a 71 y.o. male  with pmh a fib, PE on eliquis, CHF, prostate cancer, DM, CVA who presents with arm pain.  Symptoms started about 6 days ago.  Patient endorses pain in the entire arm that starts in the shoulder and radiates down to his hand.  Denies any preceding trauma.  The pain is a shooting type pain that goes down the entire arm.  Does feel numb over the right wrist and hand says it involves all 5 fingers.  Denies weakness.  He denies any chest pain or difficulty breathing.  Denies fevers.  Denies history of similar.  He denies any neck pain at all.  He has tried [place], [employer], materials and his girlfriend gave him the muscle relaxer and about 2 hours prior to arrival which has helped some.     Past Medical History:  Diagnosis Date   Arthritis    Atrial fibrillation ([place])    Cancer ([place])    Prostate   CHF (congestive heart failure) ([facility])    Diabetes mellitus without complication ([place])    Gout current and history of   History of cocaine abuse ([place])    + UDS on admission   History of deep vein thrombosis 2006   History of tobacco abuse    has smoked for 50 years   Hypertension    Pulmonary embolism ([place]) 2010   Renal infarct ([place]) 03/2019   left renal infarct   Stroke ([place]) 2017   affected speech and right side-uses cane   Varicose veins of both lower extremities    Wears dentures    full upper, partial lower    Patient Active Problem List   Diagnosis Date Noted   SOB (shortness of breath) 09/15/2021   Hypertensive urgency 09/15/2021   Pneumonia of right lower lobe due to infectious organism 08/09/2021   Elevated troponin 01/28/2020   UTI (urinary tract infection) 01/28/2020   Hypokalemia 01/28/2020   Diarrhea 01/28/2020   Chest pain 01/28/2020   Acute on chronic combined  systolic and diastolic CHF (congestive heart failure) ([place]) 09/73/5329   Acute metabolic encephalopathy 92/42/6834   Coagulation disorder ([place]) 11/29/2019   AKI (acute kidney injury) ([place])    History of cocaine abuse ([place])    Acute ischemic right PCA stroke ([place]) 09/13/2019   Vision loss, bilateral    Tobacco abuse    Bradycardia    Non-sustained ventricular tachycardia ([facility])    Dizziness 09/12/2019   Nonintractable headache    Slurred speech    Mixed diabetic hyperlipidemia associated with type 2 diabetes mellitus ([place])    Finger joint swelling, right 06/27/2019   Bilateral hand pain 06/14/2019   PAD (peripheral artery disease) ([place]) 05/20/2019   Encounter for long-term (current) use of high-risk medication 04/30/2019   Atrial fibrillation, chronic ([place]) 04/08/2019   Fever 04/08/2019   Severe sepsis ([place]) 04/08/2019   Abdominal pain 04/07/2019   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   Polyarthralgia 03/05/2019   Thrombocytopenia ([place]) 03/05/2019   Pain due to onychomycosis of toenails of both feet 11/20/2018   Diabetic neuropathy ([place]) 11/20/2018   Influenza A 06/22/2018   Community acquired pneumonia 06/22/2018   Arthritis 05/12/2017   Varicose veins of leg with swelling, bilateral 01/18/2017   Facial twitching  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 ([place]) 02/24/2015   Pneumonia 10/12/2014   Osteoarthritis of right knee 02/11/2014   Pain and swelling of knee 01/08/2014   Pain in extremity 01/08/2014   Weakness due to cerebrovascular accident 01/08/2014   Pulmonary embolism ([place]) 06/19/2012   Gout 09/22/2011   CVA, old, speech/language deficit 09/01/2011   Cerebral infarction ([place]) 04/26/2011   Type 2 diabetes mellitus with diabetic polyneuropathy, without long-term current use of insulin ([place]) 04/26/2011   Accelerated hypertension 04/26/2011   Anemia 04/26/2011   DVT (deep venous thrombosis) ([place]) 04/26/2011   Essential hypertension 04/26/2011      Physical Exam  Triage Vital Signs: ED Triage Vitals  Enc Vitals Group     BP 01/24/22 1349 (!) 154/75     Pulse Rate 01/24/22 1349 60     Resp 01/24/22 1349 16     Temp 01/24/22 1349 97.8 F (36.6 C)     Temp src --      SpO2 01/24/22 1349 97 %     Weight 01/24/22 1348 227 lb 1.2 oz (103 kg)     Height 01/24/22 1348 6\' 2"  (1.88 m)     Head Circumference --      Peak Flow --      Pain Score 01/24/22 1348 10     Pain Loc --      Pain Edu? --      Excl. in [place]? --     Most recent vital signs: Vitals:   01/24/22 1349  BP: (!) 154/75  Pulse: 60  Resp: 16  Temp: 97.8 F (36.6 C)  SpO2: 97%     General: Awake, no distress.  CV:  Good peripheral perfusion.  Resp:  Normal effort.  Abd:  No distention.  Neuro:             Awake, Alert, Oriented x 3  Other:  Mild tenderness to palpation over the right anterior shoulder, no midline C-spine tenderness or paraspinal C-spine tenderness, patient has no pain with range of neck laterally Has some difficulty with abduction of the right arm secondary to pain, also has some mild pain with flexion but range of motion is intact 5 out of 5 strength with elbow flexion, elbow extension, grip, 4-5 strength with finger abduction on the right compared to left Objective decreased sensation to palpation over the right medial forearm and all 5 fingers 2+ radial pulses bilaterally, symmetric No focal tenderness of the upper or lower arm elbow or wrist, compartments are soft, no edema or skin changes   ED Results / Procedures / Treatments  Labs (all labs ordered are listed, but only abnormal results are displayed) Labs Reviewed - No data to display   EKG     RADIOLOGY    PROCEDURES:  Critical Care performed: No  Procedures    MEDICATIONS ORDERED IN ED: Medications - No data to display   IMPRESSION / MDM / [place] / ED COURSE  I reviewed the triage vital signs and the nursing notes.                               Patient's presentation is most consistent with acute, uncomplicated illness.  Differential diagnosis includes, but is not limited to, cervical radiculopathy, other peripheral neuropathy, referred pain from the shoulder from impingement syndrome or rotator cuff pathology, less likely to be DVT, exam not consistent with limb  ischemia  Patient is a 71 year old male with multiple medical comorbidities presents with right arm pain x6 days.  There was no preceding trauma.  Pain seems to radiate from the shoulder down to the hand.  There is some associated numbness but no weakness.  He denies any neck pain.  No history of similar.  On exam he does have some pain with palpation of the shoulder but not at the neck.  He has some pain with arm abduction and flexion but range of motion is preserved.  Does have positive impingement testing.  His strength is intact other than 4-5 strength with finger abduction on the right compared to the left.  He has subjective decreased sensation to light touch over the medial forearm and all 5 fingers.  I suspect that this is more of a cervical radiculopathy given the radiation and numbness, his pain seems to localize more to the shoulder the next this could be referred pain from rotator cuff pathology.  The sensory distribution does not fit perfectly with a specific nerve root but there could be multiple nerve roots affected.  He has only mild weakness with just finger abduction not progressive weakness no findings such as this myelopathy.  Do not feel that he requires urgent imaging today.  He is anticoagulated so we will avoid NSAIDs.  Will prescribe oxycodone.  Advised that he follow-up with his primary care provider.  If symptoms are not improving may need to have MRI of the shoulder or neck.     FINAL CLINICAL IMPRESSION(S) / ED DIAGNOSES   Final diagnoses:  Arthralgia of right upper arm     Rx / DC Orders   ED Discharge Orders     None        Note:  This  document was prepared using Dragon voice recognition software and may include unintentional dictation errors.   Rada Hay, MD 01/24/22 [phone]

## 2022-01-24 NOTE — ED Triage Notes (Addendum)
C/O right arm pain 5-6 days.  Hurts worse when laying down.  Denies injury States took a muscle relaxed given to patient by a friend 2 hours PTA

## 2022-01-29 ENCOUNTER — Ambulatory Visit (INDEPENDENT_AMBULATORY_CARE_PROVIDER_SITE_OTHER): Payer: Medicare Other | Admitting: Physician Assistant

## 2022-01-29 DIAGNOSIS — C61 Malignant neoplasm of prostate: Secondary | ICD-10-CM | POA: Diagnosis not present

## 2022-01-29 MED ORDER — LEUPROLIDE ACETATE (6 MONTH) 45 MG ~~LOC~~ KIT
45.0000 mg | PACK | Freq: Once | SUBCUTANEOUS | Status: AC
  Administered 2022-01-29: 45 mg via SUBCUTANEOUS

## 2022-01-29 NOTE — Progress Notes (Signed)
Patient ID: [name], male   DOB: 1950/09/22, 71 y.o.   MRN: [id number] Eligard SubQ Injection   Due to Prostate Cancer patient is present today for a Eligard Injection.  Medication: Eligard 6 month Dose: 45 mg  Location: right  Lot: [id number] Exp: 05/18/2023  Patient tolerated well, no complications were noted  Performed by: Edwin Dada, [place]  Per Dr. Erlene Quan patient is to continue therapy for 1 year . Patient's next follow up was scheduled for July 28 2022. This appointment was scheduled using wheel and given to patient today along with reminder continue on Vitamin D 800-1000iu and Calcium 1000-1200mg  daily while on Androgen Deprivation Therapy.  PA approval dates:

## 2022-01-31 ENCOUNTER — Emergency Department: Payer: Medicare Other

## 2022-01-31 ENCOUNTER — Encounter: Payer: Self-pay | Admitting: Emergency Medicine

## 2022-01-31 ENCOUNTER — Emergency Department
Admission: EM | Admit: 2022-01-31 | Discharge: 2022-01-31 | Disposition: A | Discharge status: Home / Self Care | Payer: Medicare Other | Attending: Student in an Organized Health Care Education/Training Program | Admitting: Student in an Organized Health Care Education/Training Program

## 2022-01-31 ENCOUNTER — Other Ambulatory Visit: Payer: Self-pay

## 2022-01-31 DIAGNOSIS — K5903 Drug induced constipation: Secondary | ICD-10-CM | POA: Diagnosis not present

## 2022-01-31 DIAGNOSIS — K5900 Constipation, unspecified: Secondary | ICD-10-CM | POA: Diagnosis present

## 2022-01-31 LAB — CBC
HCT: 41.5 % (ref 39.0–52.0)
Hemoglobin: 12.7 g/dL — ABNORMAL LOW (ref 13.0–17.0)
MCH: 24.4 pg — ABNORMAL LOW (ref 26.0–34.0)
MCHC: 30.6 g/dL (ref 30.0–36.0)
MCV: 79.8 fL — ABNORMAL LOW (ref 80.0–100.0)
Platelets: 149 10*3/uL — ABNORMAL LOW (ref 150–400)
RBC: 5.2 MIL/uL (ref 4.22–5.81)
RDW: 17.8 % — ABNORMAL HIGH (ref 11.5–15.5)
WBC: 3.1 10*3/uL — ABNORMAL LOW (ref 4.0–10.5)
nRBC: 0 % (ref 0.0–0.2)

## 2022-01-31 LAB — COMPREHENSIVE METABOLIC PANEL
ALT: 11 U/L (ref 0–44)
AST: 22 U/L (ref 15–41)
Albumin: 4.4 g/dL (ref 3.5–5.0)
Alkaline Phosphatase: 103 U/L (ref 38–126)
Anion gap: 8 (ref 5–15)
BUN: 29 mg/dL — ABNORMAL HIGH (ref 8–23)
CO2: 27 mmol/L (ref 22–32)
Calcium: 9.5 mg/dL (ref 8.9–10.3)
Chloride: 102 mmol/L (ref 98–111)
Creatinine, Ser: 1.67 mg/dL — ABNORMAL HIGH (ref 0.61–1.24)
GFR, Estimated: 43 mL/min — ABNORMAL LOW (ref >60)
Glucose, Bld: 107 mg/dL — ABNORMAL HIGH (ref 70–99)
Potassium: 4.6 mmol/L (ref 3.5–5.1)
Sodium: 137 mmol/L (ref 135–145)
Total Bilirubin: 0.7 mg/dL (ref 0.3–1.2)
Total Protein: 8.1 g/dL (ref 6.5–8.1)

## 2022-01-31 LAB — LIPASE, BLOOD: Lipase: 24 U/L (ref 11–51)

## 2022-01-31 MED ORDER — LACTULOSE 10 GM/15ML PO SOLN
20.0000 g | Freq: Once | ORAL | Status: AC
  Administered 2022-01-31: 20 g via ORAL
  Filled 2022-01-31: qty 30

## 2022-01-31 NOTE — ED Triage Notes (Signed)
Pt reports has not had a BM in a week and a half. Pt reports went and saw his MD about it and was given medication with no relief. Pt reports pain and discomfort to lower abd.

## 2022-01-31 NOTE — ED Provider Notes (Signed)
[facility] Provider Note    None    (approximate)   History   Abdominal Pain and Constipation   HPI  [name] is a 71 y.o. male presents to the ER for evaluation of constipation.  States he has not moved his bowels in over a week after he was given pain medications.  Has been trying over-the-counter laxatives stool softeners without relief.  No nausea or vomiting.  He is still passing gas.  Denies any fevers or chills.       Physical Exam   Triage Vital Signs: ED Triage Vitals  Enc Vitals Group     BP 01/31/22 0811 (!) 145/89     Pulse Rate 01/31/22 0811 (!) 52     Resp 01/31/22 0811 20     Temp 01/31/22 0811 (!) 97.5 F (36.4 C)     Temp Source 01/31/22 0811 Oral     SpO2 01/31/22 0811 95 %     Weight 01/31/22 0809 227 lb 1.2 oz (103 kg)     Height 01/31/22 0809 6\' 2"  (1.88 m)     Head Circumference --      Peak Flow --      Pain Score --      Pain Loc --      Pain Edu? --      Excl. in [place]? --     Most recent vital signs: Vitals:   01/31/22 0811 01/31/22 1018  BP: (!) 145/89 125/85  Pulse: (!) 52 (!) 58  Resp: 20 18  Temp: (!) 97.5 F (36.4 C) 98.3 F (36.8 C)  SpO2: 95% 98%     Constitutional: Alert  Eyes: Conjunctivae are normal.  Head: Atraumatic. Nose: No congestion/rhinnorhea. Mouth/Throat: Mucous membranes are moist.   Neck: Painless ROM.  Cardiovascular:   Good peripheral circulation. Respiratory: Normal respiratory effort.  No retractions.  Gastrointestinal: Soft and nontender in all four quadrants Musculoskeletal:  no deformity Neurologic:  MAE spontaneously. No gross focal neurologic deficits are appreciated.  Skin:  Skin is warm, dry and intact. No rash noted. Psychiatric: Mood and affect are normal. Speech and behavior are normal.    ED Results / Procedures / Treatments   Labs (all labs ordered are listed, but only abnormal results are displayed) Labs Reviewed  COMPREHENSIVE METABOLIC PANEL -  Abnormal; Notable for the following components:      Result Value   Glucose, Bld 107 (*)    BUN 29 (*)    Creatinine, Ser 1.67 (*)    GFR, Estimated 43 (*)    All other components within normal limits  CBC - Abnormal; Notable for the following components:   WBC 3.1 (*)    Hemoglobin 12.7 (*)    MCV 79.8 (*)    MCH 24.4 (*)    RDW 17.8 (*)    Platelets 149 (*)    All other components within normal limits  LIPASE, BLOOD     EKG     RADIOLOGY Please see ED Course for my review and interpretation.  I personally reviewed all radiographic images ordered to evaluate for the above acute complaints and reviewed radiology reports and findings.  These findings were personally discussed with the patient.  Please see medical record for radiology report.    PROCEDURES:  Critical Care performed:   Procedures   MEDICATIONS ORDERED IN ED: Medications  lactulose (CHRONULAC) 10 GM/15ML solution 20 g (20 g Oral Given 01/31/22 1011)     IMPRESSION /  MDM / ASSESSMENT AND PLAN / ED COURSE  I reviewed the triage vital signs and the nursing notes.                              Differential diagnosis includes, but is not limited to, Abnormality, dehydration, SBO, constipation, obstipation, medication induced constipation, mass, diverticulitis, colitis  Patient presented to the ER for evaluation of symptoms as described above.  This presenting complaint could reflect a potentially life-threatening illness therefore the patient will be placed on continuous pulse oximetry and telemetry for monitoring.  Laboratory evaluation will be sent to evaluate for the above complaints.  Patient is nontoxic-appearing however with benign abdominal exam.  No obstructive pattern found on x-ray.  Likely secondary to recent narcotic use.  Will give enema here in the ER.  Do not feel that advanced imaging clinically indicated given nonobstructive pattern on x-ray and previous CT imaging from last year without any  evidence of more insidious process such as mass or AAA.  Patient's symptoms felt improved after enema successful bowel movement.  Dyspnea stable and appropriate for continued outpatient management.   FINAL CLINICAL IMPRESSION(S) / ED DIAGNOSES   Final diagnoses:  Drug-induced constipation     Rx / DC Orders   ED Discharge Orders     None        Note:  This document was prepared using Dragon voice recognition software and may include unintentional dictation errors.    Merlyn Lot, MD 01/31/22 1120

## 2022-02-03 ENCOUNTER — Encounter: Payer: Self-pay | Admitting: Nurse Practitioner

## 2022-02-04 ENCOUNTER — Ambulatory Visit
Admission: RE | Admit: 2022-02-04 | Discharge: 2022-02-04 | Disposition: A | Discharge status: Home / Self Care | Payer: Medicare Other | Attending: Family Medicine | Admitting: Family Medicine

## 2022-02-04 ENCOUNTER — Other Ambulatory Visit: Payer: Self-pay | Admitting: Family Medicine

## 2022-02-04 ENCOUNTER — Other Ambulatory Visit (INDEPENDENT_AMBULATORY_CARE_PROVIDER_SITE_OTHER): Payer: Self-pay | Admitting: Internal Medicine

## 2022-02-04 ENCOUNTER — Ambulatory Visit
Admission: RE | Admit: 2022-02-04 | Discharge: 2022-02-04 | Disposition: A | Discharge status: Home / Self Care | Payer: Medicare Other | Source: Ambulatory Visit | Attending: Family Medicine | Admitting: Family Medicine

## 2022-02-04 DIAGNOSIS — M5412 Radiculopathy, cervical region: Secondary | ICD-10-CM | POA: Insufficient documentation

## 2022-02-04 DIAGNOSIS — M7501 Adhesive capsulitis of right shoulder: Secondary | ICD-10-CM

## 2022-02-04 DIAGNOSIS — M79604 Pain in right leg: Secondary | ICD-10-CM

## 2022-02-04 DIAGNOSIS — M25421 Effusion, right elbow: Secondary | ICD-10-CM

## 2022-02-04 DIAGNOSIS — I739 Peripheral vascular disease, unspecified: Secondary | ICD-10-CM

## 2022-03-02 ENCOUNTER — Other Ambulatory Visit: Payer: Self-pay | Admitting: Orthopedic Surgery

## 2022-03-02 DIAGNOSIS — M4722 Other spondylosis with radiculopathy, cervical region: Secondary | ICD-10-CM

## 2022-03-03 DIAGNOSIS — E785 Hyperlipidemia, unspecified: Secondary | ICD-10-CM | POA: Insufficient documentation

## 2022-03-03 DIAGNOSIS — I872 Venous insufficiency (chronic) (peripheral): Secondary | ICD-10-CM | POA: Insufficient documentation
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
## 2022-03-06 ENCOUNTER — Emergency Department: Payer: Medicare Other

## 2022-03-06 ENCOUNTER — Emergency Department
Admission: EM | Admit: 2022-03-06 | Discharge: 2022-03-06 | Disposition: A | Discharge status: Home / Self Care | Payer: Medicare Other | Attending: Emergency Medicine | Admitting: Emergency Medicine

## 2022-03-06 ENCOUNTER — Other Ambulatory Visit: Payer: Self-pay

## 2022-03-06 DIAGNOSIS — R079 Chest pain, unspecified: Secondary | ICD-10-CM | POA: Insufficient documentation

## 2022-03-06 DIAGNOSIS — I110 Hypertensive heart disease with heart failure: Secondary | ICD-10-CM | POA: Diagnosis not present

## 2022-03-06 DIAGNOSIS — E119 Type 2 diabetes mellitus without complications: Secondary | ICD-10-CM | POA: Insufficient documentation

## 2022-03-06 DIAGNOSIS — I509 Heart failure, unspecified: Secondary | ICD-10-CM | POA: Insufficient documentation

## 2022-03-06 DIAGNOSIS — M79601 Pain in right arm: Secondary | ICD-10-CM | POA: Insufficient documentation

## 2022-03-06 DIAGNOSIS — Z8673 Personal history of transient ischemic attack (TIA), and cerebral infarction without residual deficits: Secondary | ICD-10-CM | POA: Diagnosis not present

## 2022-03-06 DIAGNOSIS — M792 Neuralgia and neuritis, unspecified: Secondary | ICD-10-CM

## 2022-03-06 LAB — CBC WITH DIFFERENTIAL/PLATELET
Abs Immature Granulocytes: 0.01 10*3/uL (ref 0.00–0.07)
Basophils Absolute: 0 10*3/uL (ref 0.0–0.1)
Basophils Relative: 1 %
Eosinophils Absolute: 0 10*3/uL (ref 0.0–0.5)
Eosinophils Relative: 1 %
HCT: 42.8 % (ref 39.0–52.0)
Hemoglobin: 13.1 g/dL (ref 13.0–17.0)
Immature Granulocytes: 0 %
Lymphocytes Relative: 29 %
Lymphs Abs: 1 10*3/uL (ref 0.7–4.0)
MCH: 24.1 pg — ABNORMAL LOW (ref 26.0–34.0)
MCHC: 30.6 g/dL (ref 30.0–36.0)
MCV: 78.7 fL — ABNORMAL LOW (ref 80.0–100.0)
Monocytes Absolute: 0.3 10*3/uL (ref 0.1–1.0)
Monocytes Relative: 9 %
Neutro Abs: 2.1 10*3/uL (ref 1.7–7.7)
Neutrophils Relative %: 60 %
Platelets: 165 10*3/uL (ref 150–400)
RBC: 5.44 MIL/uL (ref 4.22–5.81)
RDW: 17.2 % — ABNORMAL HIGH (ref 11.5–15.5)
WBC: 3.4 10*3/uL — ABNORMAL LOW (ref 4.0–10.5)
nRBC: 0 % (ref 0.0–0.2)

## 2022-03-06 LAB — BASIC METABOLIC PANEL
Anion gap: 9 (ref 5–15)
BUN: 37 mg/dL — ABNORMAL HIGH (ref 8–23)
CO2: 23 mmol/L (ref 22–32)
Calcium: 9.5 mg/dL (ref 8.9–10.3)
Chloride: 105 mmol/L (ref 98–111)
Creatinine, Ser: 1.47 mg/dL — ABNORMAL HIGH (ref 0.61–1.24)
GFR, Estimated: 50 mL/min — ABNORMAL LOW (ref >60)
Glucose, Bld: 115 mg/dL — ABNORMAL HIGH (ref 70–99)
Potassium: 3.9 mmol/L (ref 3.5–5.1)
Sodium: 137 mmol/L (ref 135–145)

## 2022-03-06 LAB — TROPONIN I (HIGH SENSITIVITY)
Troponin I (High Sensitivity): 16 ng/L (ref <18)
Troponin I (High Sensitivity): 16 ng/L (ref <18)

## 2022-03-06 LAB — BRAIN NATRIURETIC PEPTIDE: B Natriuretic Peptide: 211.1 pg/mL — ABNORMAL HIGH (ref 0.0–100.0)

## 2022-03-06 MED ORDER — OXYCODONE-ACETAMINOPHEN 5-325 MG PO TABS: 1.0000 | ORAL_TABLET | ORAL | 0 refills | Status: DC | PRN

## 2022-03-06 MED ORDER — OXYCODONE-ACETAMINOPHEN 5-325 MG PO TABS
2.0000 | ORAL_TABLET | Freq: Once | ORAL | Status: AC
  Administered 2022-03-06: 2 via ORAL
  Filled 2022-03-06: qty 2

## 2022-03-06 NOTE — ED Triage Notes (Signed)
Pt to ED POV for R sided chest pain since 3 weeks ago. Describes pain as burning.  Pt endorses SOB with exertion. States was given something by doctors for this pain that didn't help.

## 2022-03-06 NOTE — ED Provider Notes (Signed)
[facility] Provider Note    Event Date/Time   First MD Initiated Contact with Patient 03/06/22 1812     (approximate)  History   Chief Complaint: Chest Pain  HPI  [name] is a 71 y.o. male with a past medical history of arthritis, CHF, diabetes, hypertension, presents emergency department for right arm pain.  According to the patient for the past 3 to 4 weeks he has been experiencing pain in the right chest and into the right arm.  Patient states he has a history of a CVA with right-sided deficits and since his stroke he has had pain in the right arm.  Patient is currently prescribed gabapentin for his neuropathic pain but states he has not been able to sleep much over the last 3 to 4 weeks due to increased pain in the arm with movement or while lying on the arm trying to sleep.  Patient denies any shortness of breath denies any central chest pain.  No pleuritic pain.  No arm swelling.  Physical Exam   Triage Vital Signs: ED Triage Vitals  Enc Vitals Group     BP 03/06/22 1501 115/67     Pulse Rate 03/06/22 1501 78     Resp 03/06/22 1501 16     Temp 03/06/22 1501 97.6 F (36.4 C)     Temp Source 03/06/22 1501 Oral     SpO2 03/06/22 1501 98 %     Weight 03/06/22 1458 212 lb (96.2 kg)     Height 03/06/22 1458 6\' 2"  (1.88 m)     Head Circumference --      Peak Flow --      Pain Score 03/06/22 1456 9     Pain Loc --      Pain Edu? --      Excl. in [place]? --     Most recent vital signs: Vitals:   03/06/22 1501 03/06/22 1843  BP: 115/67 127/65  Pulse: 78 74  Resp: 16 20  Temp: 97.6 F (36.4 C) 97.6 F (36.4 C)  SpO2: 98% 97%    General: Awake, no distress.  CV:  Good peripheral perfusion.  Regular rate and rhythm  Resp:  Normal effort.  Equal breath sounds bilaterally.  Abd:  No distention.  Soft, nontender.  No rebound or guarding. Other:  Mild tenderness to palpation of the right shoulder and muscles of the right arm.  States pain  with range of motion of the right arm.  Good range of motion however no concern for fracture or dislocation.   ED Results / Procedures / Treatments   EKG  EKG viewed and interpreted by myself shows what appears most consistent atrial fibrillation around 79 bpm with a narrow QRS, normal axis, normal intervals, nonspecific ST changes.  RADIOLOGY  I have reviewed and interpreted the chest x-ray images I do not see any obvious consolidation on my evaluation. Radiology has read the x-ray as negative.   MEDICATIONS ORDERED IN ED: Medications  oxyCODONE-acetaminophen (PERCOCET/ROXICET) 5-325 MG per tablet 2 tablet (2 tablets Oral Given 03/06/22 1842)     IMPRESSION / MDM / ASSESSMENT AND PLAN / ED COURSE  I reviewed the triage vital signs and the nursing notes.  Patient's presentation is most consistent with acute presentation with potential threat to life or bodily function.  Patient presents emergency department for right-sided chest and arm pain he states this has been ongoing really since his stroke, but has been getting worse over the  past 3 weeks.  States it mostly bothers him at night when he is trying to rest he is unable to do so due to the pain.  Patient has prescribed gabapentin to take for the discomfort but states it is barely helping.  Patient denies any central pain denies any shortness of breath, no pleuritic pain.  The pain is worse with movement of the right arm.  But no concern for fracture or dislocation.  Patient's work-up is overall reassuring with a normal CBC, reassuring chemistry and a negative troponin.  Chest x-ray is clear.  We will dose a small amount of pain medication.  I discussed with the patient a trial of a short course of pain medication be taken at night still taking his gabapentin as prescribed by his doctor and following up with his doctor regarding further work-up.  Given the patient's reassuring work-up I believe the patient will be safe for discharge  home and outpatient follow-up.  Given 3 weeks of discomfort and a negative troponin does not seem concerning for ACS.  FINAL CLINICAL IMPRESSION(S) / ED DIAGNOSES   Neuropathic pain  Rx / DC Orders   Percocet  Note:  This document was prepared using Dragon voice recognition software and may include unintentional dictation errors.   Harvest Dark, MD 03/06/22 1925

## 2022-03-06 NOTE — ED Notes (Signed)
Blue top sent also.

## 2022-03-06 NOTE — ED Provider Triage Note (Signed)
Emergency Medicine Provider Triage Evaluation Note  [name] , a 71 y.o. male  was evaluated in triage.  Pt complains of burning right sided chest pain x3 weeks. Reports that he feels SOB now.    Review of Systems  Positive: CP, SOB Negative: Nausea, diaphoresis  Physical Exam  There were no vitals taken for this visit. Gen:   Awake, no distress   Resp:  Normal effort  MSK:   Moves extremities without difficulty  Other:    Medical Decision Making  Medically screening exam initiated at 2:56 PM.  Appropriate orders placed.  [name] was informed that the remainder of the evaluation will be completed by another provider, this initial triage assessment does not replace that evaluation, and the importance of remaining in the ED until their evaluation is complete.     Mark Old, PA-C 03/06/22 1502

## 2022-03-06 NOTE — ED Notes (Signed)
Patient discharged at this time. Wheeled to lobby to meet ride. Breathing unlabored speaking in full sentences. Verbalized understanding of all discharge, follow up, and medication teaching. Discharged homed with all belongings.

## 2022-03-08 ENCOUNTER — Encounter (INDEPENDENT_AMBULATORY_CARE_PROVIDER_SITE_OTHER): Payer: Self-pay | Admitting: Vascular Surgery

## 2022-03-08 ENCOUNTER — Ambulatory Visit (INDEPENDENT_AMBULATORY_CARE_PROVIDER_SITE_OTHER): Payer: Medicare Other | Admitting: Vascular Surgery

## 2022-03-08 ENCOUNTER — Ambulatory Visit (INDEPENDENT_AMBULATORY_CARE_PROVIDER_SITE_OTHER): Payer: Medicare Other

## 2022-03-08 VITALS — BP 130/83 | HR 76 | Resp 17 | Ht 78.0 in | Wt 215.0 lb

## 2022-03-08 DIAGNOSIS — M79605 Pain in left leg: Secondary | ICD-10-CM

## 2022-03-08 DIAGNOSIS — N280 Ischemia and infarction of kidney: Secondary | ICD-10-CM | POA: Diagnosis not present

## 2022-03-08 DIAGNOSIS — I872 Venous insufficiency (chronic) (peripheral): Secondary | ICD-10-CM

## 2022-03-08 DIAGNOSIS — I482 Chronic atrial fibrillation, unspecified: Secondary | ICD-10-CM | POA: Diagnosis not present

## 2022-03-08 DIAGNOSIS — M79604 Pain in right leg: Secondary | ICD-10-CM | POA: Diagnosis not present

## 2022-03-08 DIAGNOSIS — I739 Peripheral vascular disease, unspecified: Secondary | ICD-10-CM

## 2022-03-08 DIAGNOSIS — E785 Hyperlipidemia, unspecified: Secondary | ICD-10-CM

## 2022-03-09 ENCOUNTER — Telehealth: Payer: Self-pay | Admitting: Family

## 2022-03-09 ENCOUNTER — Ambulatory Visit: Payer: Medicare Other | Admitting: Family
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
## 2022-03-09 NOTE — Telephone Encounter (Signed)
Patient did not show for his [facility] appointment on 03/09/22. Will attempt to reschedule.

## 2022-03-19 ENCOUNTER — Inpatient Hospital Stay: Admission: RE | Admit: 2022-03-19 | Payer: Medicare Other | Source: Ambulatory Visit

## 2022-03-21 ENCOUNTER — Emergency Department: Payer: Medicare Other

## 2022-03-21 ENCOUNTER — Other Ambulatory Visit: Payer: Self-pay

## 2022-03-21 ENCOUNTER — Emergency Department
Admission: EM | Admit: 2022-03-21 | Discharge: 2022-03-21 | Disposition: A | Discharge status: Home / Self Care | Payer: Medicare Other | Attending: Emergency Medicine | Admitting: Emergency Medicine

## 2022-03-21 ENCOUNTER — Encounter: Payer: Self-pay | Admitting: Intensive Care

## 2022-03-21 DIAGNOSIS — I110 Hypertensive heart disease with heart failure: Secondary | ICD-10-CM | POA: Insufficient documentation

## 2022-03-21 DIAGNOSIS — E119 Type 2 diabetes mellitus without complications: Secondary | ICD-10-CM | POA: Insufficient documentation

## 2022-03-21 DIAGNOSIS — Z7901 Long term (current) use of anticoagulants: Secondary | ICD-10-CM | POA: Diagnosis not present

## 2022-03-21 DIAGNOSIS — M542 Cervicalgia: Secondary | ICD-10-CM | POA: Insufficient documentation

## 2022-03-21 DIAGNOSIS — Z86711 Personal history of pulmonary embolism: Secondary | ICD-10-CM | POA: Insufficient documentation

## 2022-03-21 DIAGNOSIS — M79601 Pain in right arm: Secondary | ICD-10-CM | POA: Insufficient documentation

## 2022-03-21 DIAGNOSIS — R079 Chest pain, unspecified: Secondary | ICD-10-CM | POA: Insufficient documentation

## 2022-03-21 DIAGNOSIS — I509 Heart failure, unspecified: Secondary | ICD-10-CM | POA: Diagnosis not present

## 2022-03-21 LAB — CBC
HCT: 38.4 % — ABNORMAL LOW (ref 39.0–52.0)
Hemoglobin: 12.2 g/dL — ABNORMAL LOW (ref 13.0–17.0)
MCH: 24.4 pg — ABNORMAL LOW (ref 26.0–34.0)
MCHC: 31.8 g/dL (ref 30.0–36.0)
MCV: 77 fL — ABNORMAL LOW (ref 80.0–100.0)
Platelets: 118 10*3/uL — ABNORMAL LOW (ref 150–400)
RBC: 4.99 MIL/uL (ref 4.22–5.81)
RDW: 17 % — ABNORMAL HIGH (ref 11.5–15.5)
WBC: 4 10*3/uL (ref 4.0–10.5)
nRBC: 0 % (ref 0.0–0.2)

## 2022-03-21 LAB — BASIC METABOLIC PANEL
Anion gap: 10 (ref 5–15)
BUN: 31 mg/dL — ABNORMAL HIGH (ref 8–23)
CO2: 22 mmol/L (ref 22–32)
Calcium: 9.6 mg/dL (ref 8.9–10.3)
Chloride: 110 mmol/L (ref 98–111)
Creatinine, Ser: 1.48 mg/dL — ABNORMAL HIGH (ref 0.61–1.24)
GFR, Estimated: 50 mL/min — ABNORMAL LOW (ref >60)
Glucose, Bld: 111 mg/dL — ABNORMAL HIGH (ref 70–99)
Potassium: 3.9 mmol/L (ref 3.5–5.1)
Sodium: 142 mmol/L (ref 135–145)

## 2022-03-21 LAB — TROPONIN I (HIGH SENSITIVITY): Troponin I (High Sensitivity): 14 ng/L (ref <18)

## 2022-03-21 MED ORDER — IOHEXOL 350 MG/ML SOLN
75.0000 mL | Freq: Once | INTRAVENOUS | Status: AC | PRN
  Administered 2022-03-21: 75 mL via INTRAVENOUS

## 2022-03-21 MED ORDER — OXYCODONE-ACETAMINOPHEN 5-325 MG PO TABS: 1.0000 | ORAL_TABLET | ORAL | 0 refills | Status: DC | PRN

## 2022-03-21 MED ORDER — OXYCODONE-ACETAMINOPHEN 5-325 MG PO TABS
1.0000 | ORAL_TABLET | Freq: Once | ORAL | Status: AC
  Administered 2022-03-21: 1 via ORAL
  Filled 2022-03-21: qty 1

## 2022-03-21 MED ORDER — LIDOCAINE 5 % EX PTCH
1.0000 | MEDICATED_PATCH | CUTANEOUS | Status: DC
  Administered 2022-03-21: 1 via TRANSDERMAL
  Filled 2022-03-21: qty 1

## 2022-03-21 MED ORDER — LIDOCAINE 5 % EX PTCH: 1.0000 | MEDICATED_PATCH | Freq: Two times a day (BID) | CUTANEOUS | 0 refills | Status: DC

## 2022-03-21 MED ORDER — MORPHINE SULFATE (PF) 4 MG/ML IV SOLN
4.0000 mg | Freq: Once | INTRAVENOUS | Status: AC
  Administered 2022-03-21: 4 mg via INTRAVENOUS
  Filled 2022-03-21: qty 1

## 2022-03-21 NOTE — ED Notes (Signed)
Called no answer

## 2022-03-21 NOTE — ED Provider Notes (Signed)
[facility] Provider Note    Event Date/Time   First MD Initiated Contact with Patient 03/21/22 1633     (approximate)   History   Chief Complaint Chest Pain   HPI  [name] is a 71 y.o. male with past medical history of hypertension, diabetes, stroke, CHF, DVT/PE, anemia, and atrial fibrillation on Eliquis who presents to the ED complaining of chest pain.  Patient reports that he has been dealing with about 1 month of pain in the right side of his chest and neck, which seems to radiate down his right arm.  Pain is constant and sharp, exacerbated by movement of the right arm or palpation along right chest and neck.  He denies any traumatic injury, states he has been prescribed pain medication without significant relief.  He does report feeling slightly short of breath, denies associated fever, cough, pain or swelling in his legs.  He states he has been compliant with his blood thinner.  He has baseline speech deficits and right-sided deficits following prior stroke, denies any numbness or weakness beyond his baseline.     Physical Exam   Triage Vital Signs: ED Triage Vitals [03/21/22 1422]  Enc Vitals Group     BP (!) 144/86     Pulse Rate 86     Resp 16     Temp 97.8 F (36.6 C)     Temp Source Oral     SpO2 100 %     Weight 211 lb (95.7 kg)     Height 6\' 2"  (1.88 m)     Head Circumference      Peak Flow      Pain Score 10     Pain Loc      Pain Edu?      Excl. in [place]?     Most recent vital signs: Vitals:   03/21/22 1422  BP: (!) 144/86  Pulse: 86  Resp: 16  Temp: 97.8 F (36.6 C)  SpO2: 100%    Constitutional: Alert and oriented. Eyes: Conjunctivae are normal. Head: Atraumatic. Nose: No congestion/rhinnorhea. Mouth/Throat: Mucous membranes are moist.  Neck: Tenderness to palpation noted over right trapezius, no midline cervical spine tenderness to palpation. Cardiovascular: Normal rate, regular rhythm. Grossly normal  heart sounds.  2+ radial pulses bilaterally. Respiratory: Normal respiratory effort.  No retractions. Lungs CTAB.  Right chest wall tenderness to palpation noted. Gastrointestinal: Soft and nontender. No distention. Musculoskeletal: No lower extremity tenderness nor edema.  Range of motion intact throughout right shoulder, elbow, and wrist without significant pain. Neurologic: Speech delay noted.  4 out of 5 strength with elbow flexion and extension as well as grip on right, 5 out of 5 strength in left upper extremity.    ED Results / Procedures / Treatments   Labs (all labs ordered are listed, but only abnormal results are displayed) Labs Reviewed  BASIC METABOLIC PANEL - Abnormal; Notable for the following components:      Result Value   Glucose, Bld 111 (*)    BUN 31 (*)    Creatinine, Ser 1.48 (*)    GFR, Estimated 50 (*)    All other components within normal limits  CBC - Abnormal; Notable for the following components:   Hemoglobin 12.2 (*)    HCT 38.4 (*)    MCV 77.0 (*)    MCH 24.4 (*)    RDW 17.0 (*)    Platelets 118 (*)    All other components within normal  limits  TROPONIN I (HIGH SENSITIVITY)     EKG  ED ECG REPORT I, Blake Divine, the attending physician, personally viewed and interpreted this ECG.   Date: 03/21/2022  EKG Time: 14:27  Rate: 86  Rhythm: atrial fibrillation, frequent PVC's noted  Axis: Normal  Intervals:none  ST&T Change: Inferolateral T wave inversions, similar to previous  RADIOLOGY Chest x-ray reviewed and interpreted by me with no infiltrate, edema, or effusion.  PROCEDURES:  Critical Care performed: No  Procedures   MEDICATIONS ORDERED IN ED: Medications  oxyCODONE-acetaminophen (PERCOCET/ROXICET) 5-325 MG per tablet 1 tablet (has no administration in time range)  lidocaine (LIDODERM) 5 % 1 patch (has no administration in time range)  morphine (PF) 4 MG/ML injection 4 mg (4 mg Intravenous Given 03/21/22 1716)  iohexol  (OMNIPAQUE) 350 MG/ML injection 75 mL (75 mLs Intravenous Contrast Given 03/21/22 1739)     IMPRESSION / MDM / ASSESSMENT AND PLAN / ED COURSE  I reviewed the triage vital signs and the nursing notes.                              71 y.o. male with past medical history of hypertension, diabetes, stroke, DVT/PE, atrial fibrillation on Eliquis, anemia, CHF, and PAD who presents to the ED with 1 month of worsening pain over the right side of his neck, right chest wall, and radiating into his right arm.  Patient's presentation is most consistent with acute presentation with potential threat to life or bodily function.  Differential diagnosis includes, but is not limited to, cervical myelopathy, cervical radiculopathy, PE, ACS, pneumonia, pneumothorax, muscle strain.  Patient uncomfortable appearing but in no acute distress, vital signs are unremarkable.  EKG shows chronic atrial fibrillation with T wave inversions inferolaterally, overall similar to previous and symptoms seem atypical for ACS.  Initial troponin is negative and I doubt ACS given constant symptoms for 1 month.  He reports being compliant with his anticoagulation, but does have history of DVT/PE and we will check CTA of his chest to rule out recurrent PE.  He remains neurovascular intact to his right upper extremity, if CT imaging is unremarkable would proceed with MRI of his cervical spine.  It appears he was scheduled for this 2 days ago but missed his appointment, states he was not informed of this appointment.  Additional labs are reassuring with no significant anemia or leukocytosis, chronic kidney disease stable compared to previous with no acute electrolyte abnormality.  We will treat symptomatically with IV morphine and reassess following imaging.  CTA chest is positive for scattered tiny pulmonary emboli, favored to be chronic per radiology.  These findings were reviewed with Dr. Trula Slade of vascular surgery, who agrees that they are  likely old clot and does not recommend changing patient's anticoagulation regimen, does not recommend further work-up at this time.  Patient does have a history of noncompliance with his anticoagulation per prior cardiology notes and I emphasized the need for him to take this medication regularly.  MRI of his cervical spine was performed and negative for pathology that would explain his pain, left sided spinal canal narrowing noted however patient has no pain on the left side.  Given otherwise reassuring work-up, patient is appropriate for discharge home with PCP follow-up, will be prescribed short course of pain medication as well as Lidoderm patches.  He was counseled to return to the ED for new or worsening symptoms, patient agrees with plan.  FINAL CLINICAL IMPRESSION(S) / ED DIAGNOSES   Final diagnoses:  History of pulmonary embolism  Right arm pain  Neck pain     Rx / DC Orders   ED Discharge Orders          Ordered    oxyCODONE-acetaminophen (PERCOCET) 5-325 MG tablet  Every 4 hours PRN        03/21/22 1927    lidocaine (LIDODERM) 5 %  Every 12 hours        03/21/22 1927             Note:  This document was prepared using Dragon voice recognition software and may include unintentional dictation errors.   Blake Divine, MD 03/21/22 [phone]

## 2022-03-21 NOTE — ED Triage Notes (Signed)
Patient c/o right sided chest pain X2 weeks with right arm pain and radiation to back. Seen for same on 03/06/22.   A&O x4 in triage

## 2022-04-14 ENCOUNTER — Other Ambulatory Visit: Payer: Self-pay | Admitting: Family Medicine

## 2022-04-14 DIAGNOSIS — S43431A Superior glenoid labrum lesion of right shoulder, initial encounter: Secondary | ICD-10-CM

## 2022-04-15 ENCOUNTER — Ambulatory Visit: Payer: Medicare Other | Admitting: Podiatry

## 2022-04-15 ENCOUNTER — Ambulatory Visit
Admission: RE | Admit: 2022-04-15 | Discharge: 2022-04-15 | Disposition: A | Discharge status: Home / Self Care | Payer: Medicare Other | Source: Ambulatory Visit | Attending: Family Medicine | Admitting: Family Medicine

## 2022-04-15 DIAGNOSIS — S43431A Superior glenoid labrum lesion of right shoulder, initial encounter: Secondary | ICD-10-CM | POA: Insufficient documentation

## 2022-04-22 ENCOUNTER — Ambulatory Visit: Payer: Medicare Other | Admitting: Podiatry

## 2022-04-22 ENCOUNTER — Encounter: Payer: Self-pay | Admitting: Podiatry

## 2022-04-22 VITALS — BP 112/65 | HR 83

## 2022-04-22 DIAGNOSIS — E1142 Type 2 diabetes mellitus with diabetic polyneuropathy: Secondary | ICD-10-CM

## 2022-04-22 DIAGNOSIS — I739 Peripheral vascular disease, unspecified: Secondary | ICD-10-CM

## 2022-04-22 DIAGNOSIS — M79675 Pain in left toe(s): Secondary | ICD-10-CM

## 2022-04-22 DIAGNOSIS — B351 Tinea unguium: Secondary | ICD-10-CM | POA: Diagnosis not present

## 2022-04-22 DIAGNOSIS — D689 Coagulation defect, unspecified: Secondary | ICD-10-CM

## 2022-04-22 DIAGNOSIS — M79674 Pain in right toe(s): Secondary | ICD-10-CM

## 2022-04-22 NOTE — Progress Notes (Signed)
This patient returns to my office for at risk foot care.  This patient requires this care by a professional since this patient will be at risk due to having diabetes, history of DVT and PAD.  Patient is taking eliquiss.  This patient is unable to cut nails himself since the patient cannot reach his nails.These nails are painful walking and wearing shoes.  This patient presents for at risk foot care today.  General Appearance  Alert, conversant and in no acute stress.  Vascular  Dorsalis pedis and posterior tibial  pulses are palpable  bilaterally.  Capillary return is within normal limits  bilaterally. Temperature is within normal limits  Bilaterally. Venous stasis legs  B/l.  Neurologic  Senn-Weinstein monofilament wire test diminished   bilaterally. Muscle power within normal limits bilaterally.  Nails Thick disfigured discolored nails with subungual debris  from hallux to fifth toes bilaterally. No evidence of bacterial infection or drainage bilaterally.  Orthopedic  No limitations of motion  feet .  No crepitus or effusions noted.  No bony pathology or digital deformities noted.  DJD midfoot right.  HAV  B/L  Skin  normotropic skin with no porokeratosis noted bilaterally.  No signs of infections or ulcers noted.     Onychomycosis  Pain in right toes  Pain in left toes  Consent was obtained for treatment procedures.   Mechanical debridement of nails 1-5  bilaterally performed with a nail nipper.  Filed with dremel without incident.    Return office visit   3 months                   Told patient to return for periodic foot care and evaluation due to potential at risk complications.   Gardiner Barefoot DPM

## 2022-05-10 ENCOUNTER — Emergency Department: Payer: Medicare Other

## 2022-05-10 ENCOUNTER — Other Ambulatory Visit: Payer: Self-pay

## 2022-05-10 DIAGNOSIS — Z7982 Long term (current) use of aspirin: Secondary | ICD-10-CM

## 2022-05-10 DIAGNOSIS — Z86718 Personal history of other venous thrombosis and embolism: Secondary | ICD-10-CM

## 2022-05-10 DIAGNOSIS — D696 Thrombocytopenia, unspecified: Secondary | ICD-10-CM | POA: Diagnosis not present

## 2022-05-10 DIAGNOSIS — I480 Paroxysmal atrial fibrillation: Secondary | ICD-10-CM | POA: Diagnosis present

## 2022-05-10 DIAGNOSIS — I69320 Aphasia following cerebral infarction: Secondary | ICD-10-CM

## 2022-05-10 DIAGNOSIS — I5022 Chronic systolic (congestive) heart failure: Secondary | ICD-10-CM | POA: Diagnosis present

## 2022-05-10 DIAGNOSIS — E1142 Type 2 diabetes mellitus with diabetic polyneuropathy: Secondary | ICD-10-CM | POA: Diagnosis present

## 2022-05-10 DIAGNOSIS — C7951 Secondary malignant neoplasm of bone: Secondary | ICD-10-CM | POA: Diagnosis present

## 2022-05-10 DIAGNOSIS — I69351 Hemiplegia and hemiparesis following cerebral infarction affecting right dominant side: Secondary | ICD-10-CM

## 2022-05-10 DIAGNOSIS — S46111A Strain of muscle, fascia and tendon of long head of biceps, right arm, initial encounter: Secondary | ICD-10-CM | POA: Diagnosis present

## 2022-05-10 DIAGNOSIS — J449 Chronic obstructive pulmonary disease, unspecified: Secondary | ICD-10-CM | POA: Diagnosis present

## 2022-05-10 DIAGNOSIS — C3411 Malignant neoplasm of upper lobe, right bronchus or lung: Principal | ICD-10-CM | POA: Diagnosis present

## 2022-05-10 DIAGNOSIS — F17210 Nicotine dependence, cigarettes, uncomplicated: Secondary | ICD-10-CM | POA: Diagnosis present

## 2022-05-10 DIAGNOSIS — E785 Hyperlipidemia, unspecified: Secondary | ICD-10-CM | POA: Diagnosis present

## 2022-05-10 DIAGNOSIS — M659 Synovitis and tenosynovitis, unspecified: Secondary | ICD-10-CM | POA: Diagnosis present

## 2022-05-10 DIAGNOSIS — Z86711 Personal history of pulmonary embolism: Secondary | ICD-10-CM

## 2022-05-10 DIAGNOSIS — E1122 Type 2 diabetes mellitus with diabetic chronic kidney disease: Secondary | ICD-10-CM | POA: Diagnosis present

## 2022-05-10 DIAGNOSIS — Z7984 Long term (current) use of oral hypoglycemic drugs: Secondary | ICD-10-CM

## 2022-05-10 DIAGNOSIS — N189 Chronic kidney disease, unspecified: Secondary | ICD-10-CM | POA: Diagnosis present

## 2022-05-10 DIAGNOSIS — C771 Secondary and unspecified malignant neoplasm of intrathoracic lymph nodes: Secondary | ICD-10-CM | POA: Diagnosis present

## 2022-05-10 DIAGNOSIS — Z79899 Other long term (current) drug therapy: Secondary | ICD-10-CM

## 2022-05-10 DIAGNOSIS — M67911 Unspecified disorder of synovium and tendon, right shoulder: Secondary | ICD-10-CM | POA: Diagnosis present

## 2022-05-10 DIAGNOSIS — Z8546 Personal history of malignant neoplasm of prostate: Secondary | ICD-10-CM

## 2022-05-10 DIAGNOSIS — Z833 Family history of diabetes mellitus: Secondary | ICD-10-CM

## 2022-05-10 DIAGNOSIS — Z95820 Peripheral vascular angioplasty status with implants and grafts: Secondary | ICD-10-CM

## 2022-05-10 DIAGNOSIS — Z7951 Long term (current) use of inhaled steroids: Secondary | ICD-10-CM

## 2022-05-10 DIAGNOSIS — D72819 Decreased white blood cell count, unspecified: Secondary | ICD-10-CM | POA: Diagnosis present

## 2022-05-10 DIAGNOSIS — I110 Hypertensive heart disease with heart failure: Secondary | ICD-10-CM | POA: Diagnosis present

## 2022-05-10 DIAGNOSIS — I482 Chronic atrial fibrillation, unspecified: Secondary | ICD-10-CM | POA: Diagnosis present

## 2022-05-10 DIAGNOSIS — M25511 Pain in right shoulder: Secondary | ICD-10-CM | POA: Diagnosis not present

## 2022-05-10 DIAGNOSIS — D509 Iron deficiency anemia, unspecified: Secondary | ICD-10-CM | POA: Diagnosis present

## 2022-05-10 DIAGNOSIS — Z1152 Encounter for screening for COVID-19: Secondary | ICD-10-CM

## 2022-05-10 DIAGNOSIS — Z7901 Long term (current) use of anticoagulants: Secondary | ICD-10-CM

## 2022-05-10 DIAGNOSIS — M19011 Primary osteoarthritis, right shoulder: Secondary | ICD-10-CM | POA: Diagnosis present

## 2022-05-10 DIAGNOSIS — M109 Gout, unspecified: Secondary | ICD-10-CM | POA: Diagnosis present

## 2022-05-10 DIAGNOSIS — I69322 Dysarthria following cerebral infarction: Secondary | ICD-10-CM

## 2022-05-10 LAB — CBC WITH DIFFERENTIAL/PLATELET
Abs Immature Granulocytes: 0.01 10*3/uL (ref 0.00–0.07)
Basophils Absolute: 0 10*3/uL (ref 0.0–0.1)
Basophils Relative: 0 %
Eosinophils Absolute: 0.1 10*3/uL (ref 0.0–0.5)
Eosinophils Relative: 1 %
HCT: 44.1 % (ref 39.0–52.0)
Hemoglobin: 13.2 g/dL (ref 13.0–17.0)
Immature Granulocytes: 0 %
Lymphocytes Relative: 19 %
Lymphs Abs: 1 10*3/uL (ref 0.7–4.0)
MCH: 24 pg — ABNORMAL LOW (ref 26.0–34.0)
MCHC: 29.9 g/dL — ABNORMAL LOW (ref 30.0–36.0)
MCV: 80.2 fL (ref 80.0–100.0)
Monocytes Absolute: 0.5 10*3/uL (ref 0.1–1.0)
Monocytes Relative: 10 %
Neutro Abs: 3.6 10*3/uL (ref 1.7–7.7)
Neutrophils Relative %: 70 %
Platelets: 186 10*3/uL (ref 150–400)
RBC: 5.5 MIL/uL (ref 4.22–5.81)
RDW: 17.2 % — ABNORMAL HIGH (ref 11.5–15.5)
WBC: 5.2 10*3/uL (ref 4.0–10.5)
nRBC: 0 % (ref 0.0–0.2)

## 2022-05-10 LAB — COMPREHENSIVE METABOLIC PANEL
ALT: 20 U/L (ref 0–44)
AST: 23 U/L (ref 15–41)
Albumin: 4.3 g/dL (ref 3.5–5.0)
Alkaline Phosphatase: 68 U/L (ref 38–126)
Anion gap: 12 (ref 5–15)
BUN: 30 mg/dL — ABNORMAL HIGH (ref 8–23)
CO2: 24 mmol/L (ref 22–32)
Calcium: 9.8 mg/dL (ref 8.9–10.3)
Chloride: 100 mmol/L (ref 98–111)
Creatinine, Ser: 1.23 mg/dL (ref 0.61–1.24)
GFR, Estimated: >60 mL/min (ref >60)
Glucose, Bld: 103 mg/dL — ABNORMAL HIGH (ref 70–99)
Potassium: 4 mmol/L (ref 3.5–5.1)
Sodium: 136 mmol/L (ref 135–145)
Total Bilirubin: 0.8 mg/dL (ref 0.3–1.2)
Total Protein: 8.2 g/dL — ABNORMAL HIGH (ref 6.5–8.1)

## 2022-05-10 LAB — RESP PANEL BY RT-PCR (RSV, FLU A&B, COVID)  RVPGX2
Influenza A by PCR: NEGATIVE
Influenza B by PCR: NEGATIVE
Resp Syncytial Virus by PCR: NEGATIVE
SARS Coronavirus 2 by RT PCR: NEGATIVE

## 2022-05-10 LAB — PROTIME-INR
INR: 1.1 (ref 0.8–1.2)
Prothrombin Time: 14.3 seconds (ref 11.4–15.2)

## 2022-05-10 LAB — TROPONIN I (HIGH SENSITIVITY): Troponin I (High Sensitivity): 17 ng/L (ref <18)

## 2022-05-10 NOTE — ED Notes (Signed)
FIRST NURSE NOTE:  Pt to ED from home, [facility] for chronic R shoulder pain, scheduled for surgery on 05/14/22 but can't wait that long due to pain. Seen here for same several other times per EMS report.   180/120 BP, hx HTN, other VSS, no EKG done

## 2022-05-10 NOTE — ED Triage Notes (Signed)
See first nurse note. Pt to ED for R shoulder pain since 2 months, scheduled for surgery on 12/29, states cannot take the pain. AEMS from home. Alert, oriented. Grimacing.

## 2022-05-10 NOTE — ED Provider Triage Note (Signed)
Emergency Medicine Provider Triage Evaluation Note  [name] , a 71 y.o. male  was evaluated in triage.  Pt complains of CP, L shoulder and arm pain. Patient was triaged as a L shoulder pain needing pain relief. Patient however was seen by myself in the [place] with confusion, L chest, L shoulder, L arm pain. No reported fevers, shob, cough.  Review of Systems  Positive: L chest pain, L shoulder, L arm pain Negative: Fever, cough  Physical Exam  BP (!) 127/92   Pulse 79   Temp 98.5 F (36.9 C) (Oral)   Resp 18   Ht 6\' 2"  (1.88 m)   Wt 94.3 kg   SpO2 100%   BMI 26.71 kg/m  Gen:   Awake, no distress   Resp:  Normal effort  MSK:   Moves extremities without difficulty  Other:    Medical Decision Making  Medically screening exam initiated at 7:55 PM.  Appropriate orders placed.  [name] was informed that the remainder of the evaluation will be completed by another provider, this initial triage assessment does not replace that evaluation, and the importance of remaining in the ED until their evaluation is complete.  Patient to have labs, EKG, chest xray   Darletta Moll, PA-C 05/10/22 1955

## 2022-05-11 ENCOUNTER — Inpatient Hospital Stay
Admission: EM | Admit: 2022-05-11 | Discharge: 2022-05-15 | DRG: 181 | Disposition: A | Discharge status: Home / Self Care | Payer: Medicare Other | Attending: Internal Medicine | Admitting: Internal Medicine

## 2022-05-11 ENCOUNTER — Emergency Department: Payer: Medicare Other

## 2022-05-11 DIAGNOSIS — I5020 Unspecified systolic (congestive) heart failure: Secondary | ICD-10-CM | POA: Diagnosis present

## 2022-05-11 DIAGNOSIS — S46219A Strain of muscle, fascia and tendon of other parts of biceps, unspecified arm, initial encounter: Secondary | ICD-10-CM | POA: Diagnosis present

## 2022-05-11 DIAGNOSIS — Z1152 Encounter for screening for COVID-19: Secondary | ICD-10-CM | POA: Diagnosis not present

## 2022-05-11 DIAGNOSIS — S46111A Strain of muscle, fascia and tendon of long head of biceps, right arm, initial encounter: Secondary | ICD-10-CM | POA: Diagnosis present

## 2022-05-11 DIAGNOSIS — E1142 Type 2 diabetes mellitus with diabetic polyneuropathy: Secondary | ICD-10-CM

## 2022-05-11 DIAGNOSIS — G8929 Other chronic pain: Secondary | ICD-10-CM

## 2022-05-11 DIAGNOSIS — I6930 Unspecified sequelae of cerebral infarction: Secondary | ICD-10-CM | POA: Diagnosis not present

## 2022-05-11 DIAGNOSIS — C3411 Malignant neoplasm of upper lobe, right bronchus or lung: Secondary | ICD-10-CM | POA: Diagnosis present

## 2022-05-11 DIAGNOSIS — C7951 Secondary malignant neoplasm of bone: Secondary | ICD-10-CM | POA: Diagnosis present

## 2022-05-11 DIAGNOSIS — E785 Hyperlipidemia, unspecified: Secondary | ICD-10-CM | POA: Diagnosis present

## 2022-05-11 DIAGNOSIS — I110 Hypertensive heart disease with heart failure: Secondary | ICD-10-CM | POA: Diagnosis present

## 2022-05-11 DIAGNOSIS — Z95820 Peripheral vascular angioplasty status with implants and grafts: Secondary | ICD-10-CM | POA: Diagnosis not present

## 2022-05-11 DIAGNOSIS — Z86711 Personal history of pulmonary embolism: Secondary | ICD-10-CM | POA: Diagnosis not present

## 2022-05-11 DIAGNOSIS — I69351 Hemiplegia and hemiparesis following cerebral infarction affecting right dominant side: Secondary | ICD-10-CM | POA: Diagnosis not present

## 2022-05-11 DIAGNOSIS — Z86718 Personal history of other venous thrombosis and embolism: Secondary | ICD-10-CM

## 2022-05-11 DIAGNOSIS — D72819 Decreased white blood cell count, unspecified: Secondary | ICD-10-CM | POA: Diagnosis present

## 2022-05-11 DIAGNOSIS — I480 Paroxysmal atrial fibrillation: Secondary | ICD-10-CM | POA: Diagnosis present

## 2022-05-11 DIAGNOSIS — E611 Iron deficiency: Secondary | ICD-10-CM | POA: Diagnosis present

## 2022-05-11 DIAGNOSIS — N189 Chronic kidney disease, unspecified: Secondary | ICD-10-CM | POA: Diagnosis present

## 2022-05-11 DIAGNOSIS — J449 Chronic obstructive pulmonary disease, unspecified: Secondary | ICD-10-CM | POA: Diagnosis present

## 2022-05-11 DIAGNOSIS — I10 Essential (primary) hypertension: Secondary | ICD-10-CM | POA: Diagnosis present

## 2022-05-11 DIAGNOSIS — I69320 Aphasia following cerebral infarction: Secondary | ICD-10-CM | POA: Diagnosis not present

## 2022-05-11 DIAGNOSIS — Z720 Tobacco use: Secondary | ICD-10-CM | POA: Diagnosis present

## 2022-05-11 DIAGNOSIS — M25511 Pain in right shoulder: Secondary | ICD-10-CM | POA: Diagnosis present

## 2022-05-11 DIAGNOSIS — E1122 Type 2 diabetes mellitus with diabetic chronic kidney disease: Secondary | ICD-10-CM | POA: Diagnosis present

## 2022-05-11 DIAGNOSIS — I482 Chronic atrial fibrillation, unspecified: Secondary | ICD-10-CM | POA: Diagnosis present

## 2022-05-11 DIAGNOSIS — F17210 Nicotine dependence, cigarettes, uncomplicated: Secondary | ICD-10-CM | POA: Diagnosis present

## 2022-05-11 DIAGNOSIS — Z7901 Long term (current) use of anticoagulants: Secondary | ICD-10-CM

## 2022-05-11 DIAGNOSIS — I5022 Chronic systolic (congestive) heart failure: Secondary | ICD-10-CM | POA: Diagnosis present

## 2022-05-11 DIAGNOSIS — D509 Iron deficiency anemia, unspecified: Secondary | ICD-10-CM | POA: Diagnosis present

## 2022-05-11 DIAGNOSIS — D696 Thrombocytopenia, unspecified: Secondary | ICD-10-CM | POA: Diagnosis not present

## 2022-05-11 DIAGNOSIS — C771 Secondary and unspecified malignant neoplasm of intrathoracic lymph nodes: Secondary | ICD-10-CM | POA: Diagnosis present

## 2022-05-11 DIAGNOSIS — C3410 Malignant neoplasm of upper lobe, unspecified bronchus or lung: Secondary | ICD-10-CM | POA: Diagnosis present

## 2022-05-11 DIAGNOSIS — I69322 Dysarthria following cerebral infarction: Secondary | ICD-10-CM | POA: Diagnosis not present

## 2022-05-11 LAB — APTT
aPTT: 31 seconds (ref 24–36)
aPTT: 64 seconds — ABNORMAL HIGH (ref 24–36)

## 2022-05-11 LAB — CBG MONITORING, ED
Glucose-Capillary: 89 mg/dL (ref 70–99)
Glucose-Capillary: 90 mg/dL (ref 70–99)
Glucose-Capillary: 73 mg/dL (ref 70–99)
Glucose-Capillary: 104 mg/dL — ABNORMAL HIGH (ref 70–99)

## 2022-05-11 LAB — TROPONIN I (HIGH SENSITIVITY): Troponin I (High Sensitivity): 15 ng/L (ref <18)

## 2022-05-11 MED ORDER — HYDROMORPHONE HCL 1 MG/ML IJ SOLN
1.0000 mg | Freq: Once | INTRAMUSCULAR | Status: AC
  Administered 2022-05-11: 1 mg via INTRAVENOUS
  Filled 2022-05-11: qty 1

## 2022-05-11 MED ORDER — ONDANSETRON HCL 4 MG/2ML IJ SOLN: 4.0000 mg | Freq: Four times a day (QID) | INTRAMUSCULAR | Status: DC | PRN

## 2022-05-11 MED ORDER — ALBUTEROL SULFATE (2.5 MG/3ML) 0.083% IN NEBU: 2.5000 mg | INHALATION_SOLUTION | Freq: Four times a day (QID) | RESPIRATORY_TRACT | Status: DC | PRN

## 2022-05-11 MED ORDER — EMPAGLIFLOZIN 10 MG PO TABS
10.0000 mg | ORAL_TABLET | Freq: Every day | ORAL | Status: DC
  Filled 2022-05-11 (×2): qty 1

## 2022-05-11 MED ORDER — SODIUM CHLORIDE 0.9% FLUSH
3.0000 mL | Freq: Two times a day (BID) | INTRAVENOUS | Status: DC
  Administered 2022-05-11 – 2022-05-14 (×6): 3 mL via INTRAVENOUS

## 2022-05-11 MED ORDER — FERROUS SULFATE 325 (65 FE) MG PO TABS
325.0000 mg | ORAL_TABLET | Freq: Every day | ORAL | Status: DC
  Administered 2022-05-12 – 2022-05-15 (×4): 325 mg via ORAL
  Filled 2022-05-11 (×5): qty 1

## 2022-05-11 MED ORDER — ONDANSETRON HCL 4 MG PO TABS: 4.0000 mg | ORAL_TABLET | Freq: Four times a day (QID) | ORAL | Status: DC | PRN

## 2022-05-11 MED ORDER — HEPARIN (PORCINE) 25000 UT/250ML-% IV SOLN
1550.0000 [IU]/h | INTRAVENOUS | Status: DC
  Administered 2022-05-11: 1500 [IU]/h via INTRAVENOUS
  Administered 2022-05-12 – 2022-05-13 (×2): 1650 [IU]/h via INTRAVENOUS
  Administered 2022-05-13 – 2022-05-14 (×2): 1550 [IU]/h via INTRAVENOUS
  Filled 2022-05-11 (×6): qty 250

## 2022-05-11 MED ORDER — HYDROMORPHONE HCL 1 MG/ML IJ SOLN: 0.5000 mg | INTRAMUSCULAR | Status: DC | PRN

## 2022-05-11 MED ORDER — HYDROMORPHONE HCL 1 MG/ML IJ SOLN
0.5000 mg | INTRAMUSCULAR | Status: DC | PRN
  Administered 2022-05-12: 0.5 mg via INTRAVENOUS
  Filled 2022-05-11: qty 0.5

## 2022-05-11 MED ORDER — ACETAMINOPHEN 325 MG PO TABS: 650.0000 mg | ORAL_TABLET | Freq: Four times a day (QID) | ORAL | Status: DC | PRN

## 2022-05-11 MED ORDER — LATANOPROST 0.005 % OP SOLN
1.0000 [drp] | Freq: Every day | OPHTHALMIC | Status: DC
  Administered 2022-05-13 – 2022-05-14 (×2): 1 [drp] via OPHTHALMIC
  Filled 2022-05-11: qty 2.5

## 2022-05-11 MED ORDER — LIDOCAINE 5 % EX PTCH
1.0000 | MEDICATED_PATCH | Freq: Two times a day (BID) | CUTANEOUS | Status: DC
  Administered 2022-05-11 – 2022-05-15 (×8): 1 via TRANSDERMAL
  Filled 2022-05-11 (×8): qty 1

## 2022-05-11 MED ORDER — LOSARTAN POTASSIUM 50 MG PO TABS
50.0000 mg | ORAL_TABLET | Freq: Every day | ORAL | Status: DC
  Administered 2022-05-12 – 2022-05-15 (×3): 50 mg via ORAL
  Filled 2022-05-11 (×5): qty 1

## 2022-05-11 MED ORDER — POTASSIUM CHLORIDE CRYS ER 20 MEQ PO TBCR
20.0000 meq | EXTENDED_RELEASE_TABLET | Freq: Every day | ORAL | Status: DC
  Administered 2022-05-12 – 2022-05-15 (×4): 20 meq via ORAL
  Filled 2022-05-11 (×5): qty 1

## 2022-05-11 MED ORDER — ONDANSETRON HCL 4 MG/2ML IJ SOLN
4.0000 mg | Freq: Once | INTRAMUSCULAR | Status: AC
  Administered 2022-05-11: 4 mg via INTRAVENOUS
  Filled 2022-05-11: qty 2

## 2022-05-11 MED ORDER — ASPIRIN 81 MG PO TBEC
81.0000 mg | DELAYED_RELEASE_TABLET | Freq: Every day | ORAL | Status: DC
  Administered 2022-05-13 – 2022-05-15 (×2): 81 mg via ORAL
  Filled 2022-05-11 (×5): qty 1

## 2022-05-11 MED ORDER — INSULIN ASPART 100 UNIT/ML IJ SOLN
0.0000 [IU] | Freq: Three times a day (TID) | INTRAMUSCULAR | Status: DC
  Administered 2022-05-13: 1 [IU] via SUBCUTANEOUS
  Filled 2022-05-11: qty 1

## 2022-05-11 MED ORDER — SIMVASTATIN 20 MG PO TABS
20.0000 mg | ORAL_TABLET | Freq: Every day | ORAL | Status: DC
  Administered 2022-05-11 – 2022-05-14 (×4): 20 mg via ORAL
  Filled 2022-05-11: qty 1
  Filled 2022-05-11: qty 2
  Filled 2022-05-11 (×2): qty 1

## 2022-05-11 MED ORDER — OXYCODONE HCL 5 MG PO TABS
5.0000 mg | ORAL_TABLET | ORAL | Status: DC | PRN
  Administered 2022-05-11 (×2): 10 mg via ORAL
  Administered 2022-05-11: 5 mg via ORAL
  Filled 2022-05-11: qty 1
  Filled 2022-05-11 (×2): qty 2

## 2022-05-11 MED ORDER — GABAPENTIN 400 MG PO CAPS
400.0000 mg | ORAL_CAPSULE | Freq: Three times a day (TID) | ORAL | Status: DC
  Administered 2022-05-11 – 2022-05-15 (×11): 400 mg via ORAL
  Filled 2022-05-11 (×12): qty 1

## 2022-05-11 MED ORDER — ACETAMINOPHEN 650 MG RE SUPP: 650.0000 mg | Freq: Four times a day (QID) | RECTAL | Status: DC | PRN

## 2022-05-11 MED ORDER — FUROSEMIDE 40 MG PO TABS
40.0000 mg | ORAL_TABLET | Freq: Two times a day (BID) | ORAL | Status: DC
  Administered 2022-05-11 – 2022-05-15 (×7): 40 mg via ORAL
  Filled 2022-05-11 (×9): qty 1

## 2022-05-11 MED ORDER — ALLOPURINOL 100 MG PO TABS
100.0000 mg | ORAL_TABLET | Freq: Every day | ORAL | Status: DC
  Administered 2022-05-12 – 2022-05-15 (×4): 100 mg via ORAL
  Filled 2022-05-11 (×4): qty 1

## 2022-05-11 MED ORDER — METOPROLOL SUCCINATE ER 25 MG PO TB24
12.5000 mg | ORAL_TABLET | Freq: Every day | ORAL | Status: DC
  Administered 2022-05-12 – 2022-05-15 (×3): 12.5 mg via ORAL
  Filled 2022-05-11 (×4): qty 1

## 2022-05-11 MED ORDER — IOHEXOL 350 MG/ML SOLN
75.0000 mL | Freq: Once | INTRAVENOUS | Status: AC | PRN
  Administered 2022-05-11: 75 mL via INTRAVENOUS

## 2022-05-11 MED ORDER — KETOROLAC TROMETHAMINE 30 MG/ML IJ SOLN
15.0000 mg | Freq: Once | INTRAMUSCULAR | Status: AC
  Administered 2022-05-11: 15 mg via INTRAVENOUS
  Filled 2022-05-11: qty 1

## 2022-05-11 MED ORDER — HEPARIN BOLUS VIA INFUSION
1400.0000 [IU] | Freq: Once | INTRAVENOUS | Status: AC
  Administered 2022-05-11: 1400 [IU] via INTRAVENOUS
  Filled 2022-05-11: qty 1400

## 2022-05-11 MED ORDER — OXYCODONE HCL 5 MG PO TABS: 5.0000 mg | ORAL_TABLET | Freq: Four times a day (QID) | ORAL | Status: DC | PRN

## 2022-05-11 MED ORDER — DICLOFENAC SODIUM 1 % EX GEL: 2.0000 g | Freq: Four times a day (QID) | CUTANEOUS | Status: DC | PRN

## 2022-05-11 MED ORDER — POLYETHYLENE GLYCOL 3350 17 GM/SCOOP PO POWD: 17.0000 g | Freq: Every day | ORAL | Status: DC | PRN

## 2022-05-11 NOTE — Progress Notes (Signed)
ANTICOAGULATION CONSULT NOTE - Initial Consult  Pharmacy Consult for IV heparin Indication: pulmonary embolus  No Known Allergies  Patient Measurements: Height: 6\' 2"  (188 cm) Weight: 94.3 kg (208 lb) IBW/kg (Calculated) : 82.2 Heparin Dosing Weight: 94.3 kg  Vital Signs: Temp: 97.8 F (36.6 C) (12/26 0558) Temp Source: Oral (12/26 0558) BP: 135/88 (12/26 0558) Pulse Rate: 93 (12/26 0558)  Labs: Recent Labs    05/10/22 2029 05/11/22 0421  HGB 13.2  --   HCT 44.1  --   PLT 186  --   LABPROT 14.3  --   INR 1.1  --   CREATININE 1.23  --   TROPONINIHS 17 15    Estimated Creatinine Clearance: 62.2 mL/min (by C-G formula based on SCr of 1.23 mg/dL).   Medical History: Past Medical History:  Diagnosis Date   Arthritis    Atrial fibrillation ([place])    Cancer ([place])    Prostate   CHF (congestive heart failure) ([facility])    Diabetes mellitus without complication ([place])    Gout current and history of   History of cocaine abuse ([place])    + UDS on admission   History of deep vein thrombosis 2006   History of tobacco abuse    has smoked for 50 years   Hypertension    Pulmonary embolism ([place]) 2010   Renal infarct ([place]) 03/2019   left renal infarct   Stroke ([place]) 2017   affected speech and right side-uses cane   Varicose veins of both lower extremities    Wears dentures    full upper, partial lower    Medications:   Xarelto 15 mg daily (last fill 04/2022): last dose 05/09/2022 at 1700  Assessment: 71 year old male presenting with severe right shoulder pain with previously planned surgery for 05/14/2022. PMH includes HFrEF (45-50%), hx of DVT/PE, CVA, renal infarct, and tobacco abuse. Starting IV heparin for previous PE ISO upcoming surgery.  CT angiogram of the chest was obtained which identified nonocclusive linear and bandlike scarring within the pulmonary arteries sequela of prior PEs, a right apical pleural-based mass with erosive changes of the right first rib  consistent with malignancy, right hilar adenopathy thought to be likely metastatic disease, and faint sclerotic foci at T11 that was thought to be nonspecific.   INR elevated at 1.1. aPTT 31. Baseline Hgb and Platelets stable.  Goal of Therapy:  aPTT 66-102 seconds Monitor platelets by anticoagulation protocol: Yes   Plan:  No bolus (elevated INR on DOAC PTA and indication for prior PEs) Start heparin infusion at 1500 units/hr Check aPTT level in 8 hours and daily while on heparin Follow aPTTs until correlation with heparin level. Continue to monitor H&H and platelets   Glean Salvo, PharmD, BCPS Clinical Pharmacist  05/11/2022 7:37 AM

## 2022-05-11 NOTE — Progress Notes (Signed)
Vascular and Interventional Radiology  Brief Consult Note  Patient: [name] DOB: 07-21-1950 Medical Record Number: [id number] Note Date/Time: 05/11/22 3:00 PM   Admitting Diagnosis: Pancoast tumor ([place]) [C34.10]  1. 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 Acute pain of right shoulder      Assessment  Plan: 71 y.o. year old male whom Primary Team reached out to [place] to evaluate for lung mass Bx  Imaging independently reviewed, demonstrating R apical lung mass   *consider Pulmonary consult for potential bronchoscopic Bx *no VIR intervention at this time *if inadequate sampling then may submit VIR biopsy review request for percutaneous Bx in the ambulatory setting.  Discussed plan with [facility], Dr. Fuller Plan.   Michaelle Birks, MD [facility] Radiology   Pager. [place]

## 2022-05-11 NOTE — ED Notes (Signed)
Pt transferred from Flex room 44 to 35 CPOD. Pt resting in bed Hep IV currently infusing. Will continue plan of care.
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
## 2022-05-11 NOTE — Progress Notes (Signed)
ANTICOAGULATION CONSULT NOTE - Initial Consult  Pharmacy Consult for IV heparin Indication: pulmonary embolus  No Known Allergies  Patient Measurements: Height: 6\' 2"  (188 cm) Weight: 94.3 kg (208 lb) IBW/kg (Calculated) : 82.2 Heparin Dosing Weight: 94.3 kg  Vital Signs: Temp: 99.7 F (37.6 C) (12/26 1000) Temp Source: Oral (12/26 1000) BP: 117/80 (12/26 1400) Pulse Rate: 43 (12/26 1400)  Labs: Recent Labs    05/10/22 2029 05/11/22 0421 05/11/22 0745 05/11/22 1600  HGB 13.2  --   --   --   HCT 44.1  --   --   --   PLT 186  --   --   --   APTT  --   --  31 64*  LABPROT 14.3  --   --   --   INR 1.1  --   --   --   CREATININE 1.23  --   --   --   TROPONINIHS 17 15  --   --      Estimated Creatinine Clearance: 62.2 mL/min (by C-G formula based on SCr of 1.23 mg/dL).   Medical History: Past Medical History:  Diagnosis Date   Arthritis    Atrial fibrillation ([place])    Cancer ([place])    Prostate   CHF (congestive heart failure) ([facility])    Diabetes mellitus without complication ([place])    Gout current and history of   History of cocaine abuse ([place])    + UDS on admission   History of deep vein thrombosis 2006   History of tobacco abuse    has smoked for 50 years   Hypertension    Pulmonary embolism ([place]) 2010   Renal infarct ([place]) 03/2019   left renal infarct   Stroke ([place]) 2017   affected speech and right side-uses cane   Varicose veins of both lower extremities    Wears dentures    full upper, partial lower    Medications:   Xarelto 15 mg daily (last fill 04/2022): last dose 05/09/2022 at 1700  Assessment: 71 year old male presenting with severe right shoulder pain with previously planned surgery for 05/14/2022. PMH includes HFrEF (45-50%), hx of DVT/PE, CVA, renal infarct, and tobacco abuse. Starting IV heparin for previous PE ISO upcoming surgery.  CT angiogram of the chest was obtained which identified nonocclusive linear and bandlike scarring within  the pulmonary arteries sequela of prior PEs, a right apical pleural-based mass with erosive changes of the right first rib consistent with malignancy, right hilar adenopathy thought to be likely metastatic disease, and faint sclerotic foci at T11 that was thought to be nonspecific.   INR elevated at 1.1. aPTT 31. Baseline Hgb and Platelets stable.  Goal of Therapy:  aPTT 66-102 seconds Monitor platelets by anticoagulation protocol: Yes   1226 1600 aPTT 64 sec, subtherapeutic @ 1500 units/hr  Plan:  Give heparin 1400 unit IV bolus Increase heparin infusion to 1650 units/hr Check aPTT level in 8 hours after rate change Follow aPTTs until correlation with heparin level. Continue to monitor H&H and platelets  Lorin Picket, PharmD Clinical Pharmacist  05/11/2022 5:27 PM
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
## 2022-05-11 NOTE — ED Notes (Signed)
Right shoulder / arm chronic pain x2 months , distal pulses present , movement WNL . Uses a cane to assist in ambulating at home

## 2022-05-12 ENCOUNTER — Other Ambulatory Visit: Payer: Self-pay

## 2022-05-12 LAB — CBC
HCT: 37.8 % — ABNORMAL LOW (ref 39.0–52.0)
Hemoglobin: 11.7 g/dL — ABNORMAL LOW (ref 13.0–17.0)
MCH: 24.5 pg — ABNORMAL LOW (ref 26.0–34.0)
MCHC: 31 g/dL (ref 30.0–36.0)
MCV: 79.1 fL — ABNORMAL LOW (ref 80.0–100.0)
Platelets: 149 10*3/uL — ABNORMAL LOW (ref 150–400)
RBC: 4.78 MIL/uL (ref 4.22–5.81)
RDW: 17 % — ABNORMAL HIGH (ref 11.5–15.5)
WBC: 3.9 10*3/uL — ABNORMAL LOW (ref 4.0–10.5)
nRBC: 0 % (ref 0.0–0.2)

## 2022-05-12 LAB — BASIC METABOLIC PANEL
Anion gap: 8 (ref 5–15)
BUN: 37 mg/dL — ABNORMAL HIGH (ref 8–23)
CO2: 25 mmol/L (ref 22–32)
Calcium: 9.2 mg/dL (ref 8.9–10.3)
Chloride: 104 mmol/L (ref 98–111)
Creatinine, Ser: 1.14 mg/dL (ref 0.61–1.24)
GFR, Estimated: >60 mL/min (ref >60)
Glucose, Bld: 116 mg/dL — ABNORMAL HIGH (ref 70–99)
Potassium: 3.9 mmol/L (ref 3.5–5.1)
Sodium: 137 mmol/L (ref 135–145)

## 2022-05-12 LAB — APTT
aPTT: 99 seconds — ABNORMAL HIGH (ref 24–36)
aPTT: 97 seconds — ABNORMAL HIGH (ref 24–36)

## 2022-05-12 LAB — GLUCOSE, CAPILLARY
Glucose-Capillary: 30 mg/dL — CL (ref 70–99)
Glucose-Capillary: 84 mg/dL (ref 70–99)
Glucose-Capillary: 84 mg/dL (ref 70–99)
Glucose-Capillary: 90 mg/dL (ref 70–99)

## 2022-05-12 LAB — IRON AND TIBC
Iron: 64 ug/dL (ref 45–182)
Saturation Ratios: 20 % (ref 17.9–39.5)
TIBC: 329 ug/dL (ref 250–450)
UIBC: 265 ug/dL

## 2022-05-12 LAB — FERRITIN: Ferritin: 267 ng/mL (ref 24–336)

## 2022-05-12 LAB — HEPARIN LEVEL (UNFRACTIONATED)
Heparin Unfractionated: 0.66 IU/mL (ref 0.30–0.70)
Heparin Unfractionated: 0.73 IU/mL — ABNORMAL HIGH (ref 0.30–0.70)

## 2022-05-12 LAB — CBG MONITORING, ED
Glucose-Capillary: 123 mg/dL — ABNORMAL HIGH (ref 70–99)
Glucose-Capillary: 76 mg/dL (ref 70–99)

## 2022-05-12 LAB — HEMOGLOBIN A1C
Hgb A1c MFr Bld: 7.2 % — ABNORMAL HIGH (ref 4.8–5.6)
Mean Plasma Glucose: 160 mg/dL

## 2022-05-12 MED ORDER — MORPHINE SULFATE ER 15 MG PO TBCR
15.0000 mg | EXTENDED_RELEASE_TABLET | Freq: Two times a day (BID) | ORAL | Status: DC
  Administered 2022-05-12 – 2022-05-15 (×7): 15 mg via ORAL
  Filled 2022-05-12 (×8): qty 1

## 2022-05-12 MED ORDER — GLUCOSE 40 % PO GEL
ORAL | Status: AC
  Filled 2022-05-12: qty 1

## 2022-05-12 MED ORDER — KETOROLAC TROMETHAMINE 15 MG/ML IJ SOLN
15.0000 mg | Freq: Three times a day (TID) | INTRAMUSCULAR | Status: AC
  Administered 2022-05-12 – 2022-05-14 (×5): 15 mg via INTRAVENOUS
  Filled 2022-05-12 (×5): qty 1

## 2022-05-12 MED ORDER — OXYCODONE HCL 5 MG PO TABS
5.0000 mg | ORAL_TABLET | ORAL | Status: DC | PRN
  Administered 2022-05-12 – 2022-05-14 (×3): 5 mg via ORAL
  Filled 2022-05-12 (×4): qty 1

## 2022-05-12 MED ORDER — GLUCOSE 40 % PO GEL
2.0000 | ORAL | Status: AC
  Administered 2022-05-12: 62 g via ORAL

## 2022-05-12 MED ORDER — KETOROLAC TROMETHAMINE 15 MG/ML IJ SOLN
15.0000 mg | Freq: Three times a day (TID) | INTRAMUSCULAR | Status: DC
  Administered 2022-05-12: 15 mg via INTRAVENOUS
  Filled 2022-05-12: qty 1

## 2022-05-12 MED ORDER — HYDROMORPHONE HCL 1 MG/ML IJ SOLN
0.5000 mg | INTRAMUSCULAR | Status: DC | PRN
  Administered 2022-05-14: 0.5 mg via INTRAVENOUS
  Filled 2022-05-12: qty 1

## 2022-05-12 NOTE — Progress Notes (Signed)
[facility] PROGRESS NOTE  [name] SHF:[id number] DOB: 08-30-1950 DOA: 05/11/2022 PCP: Theotis Burrow, MD  Status: Remains inpatient appropriate because: Continues to require treatment and evaluation for possible Pancoast tumor.  Also has intractable pain related to known bicep tear prior to admission.  Has recently initiated long-acting oral as well as short acting oral narcotics and also utilizing IV narcotics as bridge until pain better controlled.  Level of care: Med-Surg   Code Status: Full Family Communication: Patient only DVT prophylaxis: Heparin infusion   HPI: 71 year old male with history of hypertension, HFrEF (45 to 50%), PAF on anticoagulation as well as history of DVT/PE, CVA with residual right-sided weakness, dysarthria, prior renal infarct.  Patient presented to the ER because of right shoulder pain.  Recent MRI did demonstrate tear of the long head of the biceps as well as mild supraspinatus and subscapularis tendinopathy in the context of moderate to severe degenerative glue no humeral arthropathy as well as mild rotator synovitis.  During the evaluation in the ER imaging revealed a mass concerning for Pancoast tumor.  Patient was admitted for further workup of the possible malignancy as well as treatment for intractable right arm and shoulder pain.  Subjective: Patient continues to report significant right shoulder pain and inability to use the right arm for a significant period of time.  He is unable to feed himself.  Agreeable to adjustment in pain medication since he is unable to sleep due to pain  Objective: Vitals:   05/12/22 0530 05/12/22 0600  BP: (!) 134/101 (!) 129/104  Pulse:  (!) 54  Resp: 13 20  Temp:    SpO2:      Intake/Output Summary (Last 24 hours) at 05/12/2022 0810 Last data filed at 05/11/2022 1400 Gross per 24 hour  Intake --  Output 150 ml  Net -150 ml   Filed Weights   05/10/22 1827  Weight: 94.3 kg     Exam:  Constitutional: NAD, calm, uncomfortable secondary to ongoing right shoulder pain Respiratory: clear to auscultation bilaterally, no wheezing, no crackles. Normal respiratory effort. No accessory muscle use.  Cardiovascular: Atrial fibrillation, no murmurs / rubs / gallops. No extremity edema. 2+ pedal pulses.  Abdomen: no tenderness, no masses palpated. No hepatosplenomegaly. Bowel sounds positive.  Musculoskeletal: no clubbing / cyanosis. No joint deformity upper and lower extremities. Good ROM, no contractures.  Atrophy of right upper extremity noted.  Inability to move RUE at the shoulder or lift arm up or outwards.  Able to move hand but this also causes pain that radiates to the shoulder. Skin: no rashes, lesions, ulcers. No induration Neurologic: CN 2-12 grossly intact. Sensation intact, DTR normal. Strength 5/5 x all 4 extremities although RUE limited secondary to pain Psychiatric: Normal judgment and insight. Alert and oriented x 3. Normal mood.    Assessment/Plan:  Right shoulder pain /Biceps tendon tear Acute on chronic.  Patient has been having right shoulder pain since back in September, but recently noted to be more severe. MR Shoulder 11/30: 1. Longitudinal tearing of the long head of the biceps tendon in the proximal bicipital groove. 2. Mild supraspinatus and subscapularis tendinopathy. Unfavorable subacromial morphology. 3. Deformity of the distal clavicle probably from an old fracture. There is also substantial ossification below the distal clavicle along the expected location of the coracoclavicular ligament, pseudo articulating with the coracoid. 4. Moderate to severe degenerative glenohumeral arthropathy. 5. Mild synovitis along the rotator interval.  -Evaluated and has been seen by orthopedics with recent MRI  showing biceps tendon tear abnormalities for which patient was scheduled to have surgery with orthopedics on 12/29.  Question if patient's  symptoms -Have added MS Contin 15 mg twice daily, supplemental low-dose oral oxycodone as well as IV Dilaudid for breakthrough pain -Will give scheduled Toradol IV every 8 hours for 5 doses -Continue gabapentin -Please note that patient will need to follow-up with orthopedic physician Dr. Roland Rack after discharge.  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  CT angiogram of the chest noted right apical pleural-based mass with erosive changes of the right first rib consistent with malignancy, right hilar adenopathy thought to be likely metastatic disease, and faint sclerotic foci at T11 that was thought to be nonspecific. [facility] IR and pulmonary medicine input -Timing of bronchoscopy with EBUS at discretion of pulmonary medicine -Patient will need to be set up with hematology oncology tissue diagnosis confirmed -Patient does have slightly low WBC as well as borderline thrombocytopenia that may or may not be related to malignancy process   Chronic atrial fibrillation on chronic anticoagulation Currently rate controlled on metoprolol -Hold Xarelto in favor of full dose heparin pending procedures as described above   History of DVT/PE Patient with prior history of DVT back in 2018 on the right leg, and  CTA of the chest from 03/21/2022 noted concern for scattered tiny pulmonary emboli are favored to be sequela of prior pulmonary embolism. -Anticoagulation as above   Controlled diabetes mellitus type 2, without long-term use of insulin On admission glucose 103.  Glucose last hemoglobin A1c 6.6 on 08/09/2021. -Hypoglycemic protocol -Continue home Jardiance -CBGs before every meal with sensitive SSI  -Continue statin  Heart failure with reduced EF Chronic patient appears  relatively euvolemic at this time without significant lower extremity edema or signs of JVD. Last EF noted to be 45 to 50% with determinate diastolic parameters 06/9516. -Continue Lasix, Cozaar and Jardiance as part of GDMT -Strict I&Os and daily weights   Essential hypertension Blood pressures currently maintained 97/77-169/100. -Continue current blood pressure regimen of furosemide, losartan, and metoprolol.   History of CVA with residual deficit Patient with history of prior stroke with residual right-sided weakness and expressive aphasia -Continue aspirin and statin   History of iron deficiency anemia Chronic.  Labs significant for hemoglobin 13.3 with MCH low at 24 with elevated RDW.  Patient with prior history of iron deficiency in the distant past. -Anemia panel unremarkable and hemoglobin stable at 11.7 -Platelets are 149,000 so continue to monitor in setting of IV heparin use   Hyperlipidemia -Continue simvastatin   Tobacco abuse -Continue to counsel on need of cessation of tobacco use    Data Reviewed: Basic Metabolic Panel: Recent Labs  Lab 05/10/22 2029 05/12/22 0243  NA 136 137  K 4.0 3.9  CL 100 104  CO2 24 25  GLUCOSE 103* 116*  BUN 30* 37*  CREATININE 1.23 1.14  CALCIUM 9.8 9.2   Liver Function Tests: Recent Labs  Lab 05/10/22 2029  AST 23  ALT 20  ALKPHOS 68  BILITOT 0.8  PROT 8.2*  ALBUMIN 4.3   No results for input(s): "LIPASE", "AMYLASE" in the last 168 hours. No results for input(s): "AMMONIA" in the last 168 hours. CBC: Recent Labs  Lab 05/10/22 2029 05/12/22 0243  WBC 5.2 3.9*  NEUTROABS 3.6  --   HGB 13.2 11.7*  HCT 44.1 37.8*  MCV 80.2 79.1*  PLT 186 149*   Cardiac Enzymes: No results for input(s): "CKTOTAL", "CKMB", "CKMBINDEX", "TROPONINI" in the last 168 hours. BNP (last 3 results) Recent Labs    08/09/21 0053 09/14/21 1748 03/06/22 1502  BNP 489.2* 661.8* 211.1*    ProBNP (last 3 results) No results for input(s):  "PROBNP" in the last 8760 hours.  CBG: Recent Labs  Lab 05/11/22 1222 05/11/22 1615 05/11/22 2120 05/12/22 0252 05/12/22 0755  GLUCAP 90 73 104* 123* 76    Recent Results (from the past 240 hour(s))  Resp panel by RT-PCR (RSV, Flu A&B, Covid) Anterior Nasal Swab     Status: None   Collection Time: 05/10/22  8:29 PM   Specimen: Anterior Nasal Swab  Result Value Ref Range Status   SARS Coronavirus 2 by RT PCR NEGATIVE NEGATIVE Final    Comment: (NOTE) SARS-CoV-2 target nucleic acids are NOT DETECTED.  The SARS-CoV-2 RNA is generally detectable in upper respiratory specimens during the acute phase of infection. The lowest concentration of SARS-CoV-2 viral copies this assay can detect is 138 copies/mL. A negative result does not preclude SARS-Cov-2 infection and should not be used as the sole basis for treatment or other patient management decisions. A negative result may occur with  improper specimen collection/handling, submission of specimen other than nasopharyngeal swab, presence of viral mutation(s) within the areas targeted by this assay, and inadequate number of viral copies(<138 copies/mL). A negative result must be combined with clinical observations, patient history, and epidemiological information. The expected result is Negative.  Fact Sheet for Patients:  EntrepreneurPulse.com.au  Fact Sheet for Healthcare Providers:  IncredibleEmployment.be  This test is no t yet approved or cleared by the [place] FDA and  has been authorized for detection and/or diagnosis of SARS-CoV-2 by FDA under an Emergency Use Authorization (EUA). This EUA will remain  in effect (meaning this test can be used) for the duration of the COVID-19 declaration under Section 564(b)(1) of the Act, 21 U.S.C.section 360bbb-3(b)(1), unless the authorization is terminated  or revoked sooner.       Influenza A by PCR NEGATIVE NEGATIVE Final   Influenza B  by PCR NEGATIVE NEGATIVE Final    Comment: (NOTE) The Xpert Xpress SARS-CoV-2/FLU/RSV plus assay is intended as an aid in the diagnosis of influenza from Nasopharyngeal swab specimens and should not be used as a sole basis for treatment. Nasal washings and aspirates are unacceptable for Xpert Xpress SARS-CoV-2/FLU/RSV testing.  Fact Sheet for Patients: EntrepreneurPulse.com.au  Fact Sheet for Healthcare Providers: IncredibleEmployment.be  This test is not yet approved or cleared by the [place] FDA and has been authorized for detection and/or diagnosis of SARS-CoV-2 by FDA under an Emergency Use Authorization (EUA). This EUA will remain in effect (meaning this test can be used) for the duration of the COVID-19 declaration under Section 564(b)(1) of the Act, 21 U.S.C. section 360bbb-3(b)(1), unless the authorization is terminated or revoked.     Resp Syncytial Virus by PCR NEGATIVE NEGATIVE Final    Comment: (NOTE) Fact Sheet for Patients: EntrepreneurPulse.com.au  Fact Sheet for Healthcare Providers: IncredibleEmployment.be  This test is not yet approved or cleared by the [place] FDA and has been authorized for detection and/or diagnosis of SARS-CoV-2 by FDA under an Emergency Use Authorization (EUA). This EUA will remain in effect (meaning this test can be used) for the duration of the COVID-19 declaration under Section 564(b)(1)  of the Act, 21 U.S.C. section 360bbb-3(b)(1), unless the authorization is terminated or revoked.  Performed at [facility], [address] [place], [place] [address]      Studies: CT Angio Chest PE W and/or Wo Contrast  Result Date: 05/11/2022 CLINICAL DATA:  Concern for pulmonary embolism. EXAM: CT ANGIOGRAPHY CHEST WITH CONTRAST TECHNIQUE: Multidetector CT imaging of the chest was performed using the standard protocol during bolus administration of  intravenous contrast. Multiplanar CT image reconstructions and MIPs were obtained to evaluate the vascular anatomy. RADIATION DOSE REDUCTION: This exam was performed according to the departmental dose-optimization program which includes automated exposure control, adjustment of the mA and/or kV according to patient size and/or use of iterative reconstruction technique. CONTRAST:  41mL OMNIPAQUE IOHEXOL 350 MG/ML SOLN COMPARISON:  Chest CT dated 03/21/2022. FINDINGS: Cardiovascular: There is mild cardiomegaly with biatrial dilatation. No pericardial effusion. Three-vessel coronary vascular calcification. Mild atherosclerotic calcification of the thoracic aorta. No aneurysmal dilatation. Nonocclusive linear and bandlike scarring within the pulmonary arteries sequela of prior PEs. No acute pulmonary artery embolus. No CT evidence of right heart straining. Mediastinum/Nodes: Right hilar adenopathy measures 2.5 cm in short axis similar to prior CT. No mediastinal adenopathy. The esophagus is grossly unremarkable. No mediastinal fluid collection. Lungs/Pleura: There is a 3.2 x 5.1 cm right apical pleural based mass with erosive changes of the first rib consistent with malignancy. No pleural effusion or pneumothorax. A 9 mm right lung base posterior pleural base nodule similar to prior CT. The central airways are patent. Upper Abdomen: No acute findings. Atrophic appearance of the upper pole of the left kidney as seen previously. Musculoskeletal: Osteopenia with degenerative changes of the spine. Erosion of the right first rib by the right apical tumor. Faint sclerotic foci in T11, nonspecific. Metastatic disease is not excluded. No acute osseous pathology. Review of the MIP images confirms the above findings. IMPRESSION: 1. No acute pulmonary artery embolus. Nonocclusive linear and bandlike scarring within the pulmonary arteries sequela of prior PEs. 2. Right apical pleural based mass with erosive changes of the right  first rib consistent with malignancy. 3. Right hilar adenopathy, likely metastatic disease. 4. Faint sclerotic foci in T11, nonspecific. Metastatic disease is not excluded. 5.  Aortic Atherosclerosis (ICD10-I70.0). Electronically Signed   By: Anner Crete M.D.   On: 05/11/2022 02:35   DG Chest 2 View  Result Date: 05/10/2022 CLINICAL DATA:  Chest pain EXAM: CHEST - 2 VIEW COMPARISON:  Chest x-ray dated March 21, 2022 FINDINGS: Stable cardiomegaly. Asymmetric right apical opacity which is increased when compared with prior exam. Both lungs are otherwise clear. The visualized skeletal structures are unremarkable. IMPRESSION: 1. Asymmetric right apical opacity concerning for Pancoast tumor. Recommend further evaluation with chest CT. 2. Stable cardiomegaly. Electronically Signed   By: Yetta Glassman M.D.   On: 05/10/2022 20:26    Scheduled Meds:  allopurinol  100 mg Oral Daily   aspirin EC  81 mg Oral Daily   empagliflozin  10 mg Oral Daily   ferrous sulfate  325 mg Oral Q breakfast   furosemide  40 mg Oral BID   gabapentin  400 mg Oral TID   insulin aspart  0-9 Units Subcutaneous TID WC   latanoprost  1 drop Both Eyes QHS   lidocaine  1 patch Transdermal Q12H   losartan  50 mg Oral Daily   metoprolol succinate  12.5 mg Oral Daily   potassium chloride SA  20 mEq Oral Daily   simvastatin  20 mg Oral QHS   sodium chloride flush  3 mL Intravenous Q12H   Continuous Infusions:  heparin 1,650 Units/hr (05/11/22 1732)    Principal Problem:   Right shoulder pain Active Problems:   Type 2 diabetes mellitus with diabetic polyneuropathy, without long-term current use of insulin (HCC)   Essential hypertension   Atrial fibrillation, chronic (HCC)   Tobacco abuse   Hyperlipidemia   Pancoast tumor (HCC)   Biceps tendon tear   History of DVT (deep vein thrombosis)   History of pulmonary embolus (PE)   Chronic anticoagulation   Heart failure with reduced ejection fraction ([place])   History  of CVA with residual deficit   Iron deficiency   Consultants: Interventional radiology Pulmonary medicine  Procedures: None  Antibiotics: None    Time spent: 35 minutes    Erin Hearing ANP  [facility] 7 am - 330 pm/M-F for direct patient care and secure chat Please refer to Amion for contact info 1  days

## 2022-05-12 NOTE — ED Notes (Signed)
Sitting at nurses station when patient came to his doorway with blood running down arm, IV pulled out, and vital sign machinery pulled off. Patient assisted back into room and onto bench while this nurse changed patient's linens, cleaned vital sign machinery, and patient of all blood. Bleeding controlled with guaze and coban. Patient assisted back in bed and provided with clean gown and warm blanket. EVS contacted to assist in cleanup of room. Room cleaned and mopped. When this nurse asked patient why he got up without assistance, patient states "I had to go to the bathroom. I had to pee." (Urinal was at bedside and within reach.) Patient reeducated to use call light if assistance is needed. Patient provided with new urinal and call light remains within reach.

## 2022-05-12 NOTE — ED Notes (Signed)
Patient complaining of pain in right shoulder/arm/back. Patient requesting something for pain. Prn medication given at this time.
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
## 2022-05-12 NOTE — Progress Notes (Signed)
ANTICOAGULATION CONSULT NOTE   Pharmacy Consult for IV heparin Indication: pulmonary embolus  No Known Allergies  Patient Measurements: Height: 6\' 2"  (188 cm) Weight: 94.3 kg (208 lb) IBW/kg (Calculated) : 82.2 Heparin Dosing Weight: 94.3 kg  Vital Signs: Temp: 98.2 F (36.8 C) (12/26 2357) Temp Source: Oral (12/26 2357) BP: 109/65 (12/27 0230) Pulse Rate: 71 (12/27 0230)  Labs: Recent Labs    05/10/22 2029 05/11/22 0421 05/11/22 0745 05/11/22 1600 05/12/22 0243  HGB 13.2  --   --   --  11.7*  HCT 44.1  --   --   --  37.8*  PLT 186  --   --   --  149*  APTT  --   --  31 64* 99*  LABPROT 14.3  --   --   --   --   INR 1.1  --   --   --   --   HEPARINUNFRC  --   --   --   --  0.66  CREATININE 1.23  --   --   --  1.14  TROPONINIHS 17 15  --   --   --      Estimated Creatinine Clearance: 67.1 mL/min (by C-G formula based on SCr of 1.14 mg/dL).   Medical History: Past Medical History:  Diagnosis Date   Arthritis    Atrial fibrillation ([place])    Cancer ([place])    Prostate   CHF (congestive heart failure) (HCC)    Diabetes mellitus without complication ([place])    Gout current and history of   History of cocaine abuse ([place])    + UDS on admission   History of deep vein thrombosis 2006   History of tobacco abuse    has smoked for 50 years   Hypertension    Pulmonary embolism ([place]) 2010   Renal infarct ([place]) 03/2019   left renal infarct   Stroke ([place]) 2017   affected speech and right side-uses cane   Varicose veins of both lower extremities    Wears dentures    full upper, partial lower    Medications:   Xarelto 15 mg daily (last fill 04/2022): last dose 05/09/2022 at 1700  Assessment: 71 year old male presenting with severe right shoulder pain with previously planned surgery for 05/14/2022. PMH includes HFrEF (45-50%), hx of DVT/PE, CVA, renal infarct, and tobacco abuse. Starting IV heparin for previous PE ISO upcoming surgery.  CT angiogram of the chest was  obtained which identified nonocclusive linear and bandlike scarring within the pulmonary arteries sequela of prior PEs, a right apical pleural-based mass with erosive changes of the right first rib consistent with malignancy, right hilar adenopathy thought to be likely metastatic disease, and faint sclerotic foci at T11 that was thought to be nonspecific.   INR elevated at 1.1. aPTT 31. Baseline Hgb and Platelets stable.  Goal of Therapy:  aPTT 66-102 seconds Monitor platelets by anticoagulation protocol: Yes   1226 1600 aPTT 64 sec, subtherapeutic @ 1500 units/hr 12/27 0252 aPTT 99, HL 0.66, therapeutic x 1  Plan:  Continue heparin infusion at 1650 units/hr Recheck aPTT in 8 hours to confirm Recheck HL in 8 hrs to confirm correlation Continue to monitor H&H and platelets  Renda Rolls, PharmD, [facility] 05/12/2022 3:43 AM

## 2022-05-12 NOTE — Progress Notes (Signed)
[facility] for IV heparin Indication: pulmonary embolus  No Known Allergies  Patient Measurements: Height: 6\' 2"  (188 cm) Weight: 94.3 kg (208 lb) IBW/kg (Calculated) : 82.2 Heparin Dosing Weight: 94.3 kg  Vital Signs: Temp: 98 F (36.7 C) (12/27 1026) BP: 124/69 (12/27 1026) Pulse Rate: 68 (12/27 1026)  Labs: Recent Labs    05/10/22 2029 05/11/22 0421 05/11/22 0745 05/11/22 1600 05/12/22 0243 05/12/22 1242  HGB 13.2  --   --   --  11.7*  --   HCT 44.1  --   --   --  37.8*  --   PLT 186  --   --   --  149*  --   APTT  --   --    < > 64* 99* 97*  LABPROT 14.3  --   --   --   --   --   INR 1.1  --   --   --   --   --   HEPARINUNFRC  --   --   --   --  0.66 0.73*  CREATININE 1.23  --   --   --  1.14  --   TROPONINIHS 17 15  --   --   --   --    < > = values in this interval not displayed.     Estimated Creatinine Clearance: 67.1 mL/min (by C-G formula based on SCr of 1.14 mg/dL).   Medical History: Past Medical History:  Diagnosis Date   Arthritis    Atrial fibrillation ([place])    Cancer ([place])    Prostate   CHF (congestive heart failure) (H[facility])    Diabetes mellitus without complication ([place])    Gout current and history of   History of cocaine abuse ([place])    + UDS on admission   History of deep vein thrombosis 2006   History of tobacco abuse    has smoked for 50 years   Hypertension    Pulmonary embolism ([place]) 2010   Renal infarct ([place]) 03/2019   left renal infarct   Stroke ([place]) 2017   affected speech and right side-uses cane   Varicose veins of both lower extremities    Wears dentures    full upper, partial lower    Medications:   Xarelto 15 mg daily (last fill 04/2022): last dose 05/09/2022 at 1700  Assessment: 71 year old male presenting with severe right shoulder pain with previously planned surgery for 05/14/2022. PMH includes HFrEF (45-50%), hx of DVT/PE, CVA, renal infarct, and tobacco abuse. Starting IV  heparin for previous PE ISO upcoming surgery.  CT angiogram of the chest was obtained which identified nonocclusive linear and bandlike scarring within the pulmonary arteries sequela of prior PEs, a right apical pleural-based mass with erosive changes of the right first rib consistent with malignancy, right hilar adenopathy thought to be likely metastatic disease, and faint sclerotic foci at T11 that was thought to be nonspecific.   INR elevated at 1.1. aPTT 31. Baseline Hgb and Platelets stable.  Goal of Therapy:  aPTT 66-102 seconds Monitor platelets by anticoagulation protocol: Yes   1226 1600 aPTT 64 sec, subtherapeutic @ 1500 units/hr 12/27 0252 aPTT 99, HL 0.66, therapeutic x 1 12/27 1242 aPTT 97; HL 0.73, therapeutic x 2   Plan:  Continue heparin infusion at 1650 units/hr Next aPTT tomorrow AM Recheck HL in 8 hrs to confirm correlation (not correlating yet) Continue to monitor H&H and platelets  Glean Salvo, PharmD, BCPS Clinical Pharmacist  05/12/2022 2:55 PM

## 2022-05-12 NOTE — ED Notes (Signed)
Patient is resting comfortably. 

## 2022-05-13 DIAGNOSIS — I482 Chronic atrial fibrillation, unspecified: Secondary | ICD-10-CM | POA: Diagnosis not present

## 2022-05-13 DIAGNOSIS — C3411 Malignant neoplasm of upper lobe, right bronchus or lung: Secondary | ICD-10-CM | POA: Diagnosis not present

## 2022-05-13 DIAGNOSIS — M25511 Pain in right shoulder: Secondary | ICD-10-CM | POA: Diagnosis not present

## 2022-05-13 DIAGNOSIS — S46219A Strain of muscle, fascia and tendon of other parts of biceps, unspecified arm, initial encounter: Secondary | ICD-10-CM | POA: Diagnosis not present

## 2022-05-13 LAB — GLUCOSE, CAPILLARY
Glucose-Capillary: 96 mg/dL (ref 70–99)
Glucose-Capillary: 68 mg/dL — ABNORMAL LOW (ref 70–99)
Glucose-Capillary: 79 mg/dL (ref 70–99)
Glucose-Capillary: 132 mg/dL — ABNORMAL HIGH (ref 70–99)
Glucose-Capillary: 124 mg/dL — ABNORMAL HIGH (ref 70–99)

## 2022-05-13 LAB — CBC
HCT: 37.1 % — ABNORMAL LOW (ref 39.0–52.0)
Hemoglobin: 11.5 g/dL — ABNORMAL LOW (ref 13.0–17.0)
MCH: 24.2 pg — ABNORMAL LOW (ref 26.0–34.0)
MCHC: 31 g/dL (ref 30.0–36.0)
MCV: 77.9 fL — ABNORMAL LOW (ref 80.0–100.0)
Platelets: 152 10*3/uL (ref 150–400)
RBC: 4.76 MIL/uL (ref 4.22–5.81)
RDW: 16.9 % — ABNORMAL HIGH (ref 11.5–15.5)
WBC: 3.3 10*3/uL — ABNORMAL LOW (ref 4.0–10.5)
nRBC: 0 % (ref 0.0–0.2)

## 2022-05-13 LAB — APTT: aPTT: 129 seconds — ABNORMAL HIGH (ref 24–36)

## 2022-05-13 LAB — HEPARIN LEVEL (UNFRACTIONATED)
Heparin Unfractionated: 0.75 IU/mL — ABNORMAL HIGH (ref 0.30–0.70)
Heparin Unfractionated: 0.61 IU/mL (ref 0.30–0.70)

## 2022-05-13 NOTE — Progress Notes (Signed)
PROGRESS NOTE    [name]  PNT:[id number] DOB: 09/14/1950 DOA: 05/11/2022 PCP: Theotis Burrow, MD   Brief Narrative:  HPI per Dr. Jon Billings [name] is a 71 y.o. 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  He has been evaluated by orthopedic surgery and had recent MRI of the shoulder 11/30 which showed longitudinal tearing of the long head of the biceps tendon in the proximal bicipital groove, mild supraspinatus and subscapularis tendinopathy, deformity of the distal clavicle, likely old fracture, moderate to severe degenerative glenohumeral arthropathy, and mild synovitis along the rotator interval.  Plans were made for surgery on 05/14/2022 with orthopedics.  He reports that at home he was having severe pain and only receiving 1 to 2 hours of relief with the pain medications as given.  Review of PMDP notes given 14 pills of tramadol 50 mg last on 12/18.  He was having associated symptoms of numbness.  At baseline patient ambulates with use of a cane and lives alone, but reported having difficulty caring for himself due to his symptoms.  He does still smoke approximately 4 cigarettes/day on average.   In the emergency department patient was noted to be afebrile with blood pressure was 127/92 -169/100.  Labs noted hemoglobin 13.2, MCH 24, RDW 17.2, BUN 30, creatinine 1.23, and high-sensitivity troponins negative x 2.  Chest x-ray noted asymmetric right apical opacity concerning for Pancoast tumor and stable cardiomegaly.  CT angiogram of the chest was obtained which identified a right apical pleural-based mass with erosive changes of the right first rib consistent with malignancy, right hilar adenopathy thought to be likely metastatic disease, and  faint sclerotic foci at T11 that was thought to be nonspecific.  Patient has been given Zofran, ketorolac, and Dilaudid IV for pain.   **Interim History  Continues to have shoulder pain.  After further evaluation pulmonary feels that he can have an outpatient biopsy of his lung nodule and the interventional radiologist feels that there is no VIR intervention required at this time but they feel that if there is inadequate sampling that they may submit a VIR biopsy request for percutaneous biopsy in the ambulatory setting  Assessment and Plan:  Right shoulder pain  Biceps tendon tear -Acute on chronic.   -Patient has been having right shoulder pain since back in September, but recently noted to be more severe.   -Evaluated and has been seen by orthopedics with recent MRI showing biceps tendon tear abnormalities for which patient was scheduled to have surgery with orthopedics on 12/29.  Question if patient's symptoms -Admit to MedSurg bed -Oxycodone/Dilaudid IV as needed for moderate to severe pain -Continue gabapentin and currently he is getting scheduled Toradol IV every 8 hours for 5 doses -Consult Orthopedic Surgery to see if there is anything else that can be done besides pain control; he is supposed to follow-up with Dr. 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   -Chest x-ray gave concern for right apical opacity.   -CT angiogram of the chest noted right apical pleural-based mass with erosive changes of the right first rib  consistent with malignancy, right hilar adenopathy thought to be likely metastatic disease, and faint sclerotic foci at T11 that was thought to be nonspecific. -Initially consulted IR for possible  biopsy, but was advised to consult PCCM for possible endobronchial biopsy -Pulmonary recommends outpatient bronchoscopy and tissue sampling -Patient will need to be set up with hematology oncology eventually -He continues to have a slightly low WBC as well as borderline thrombocytopenia that may or may not be related to the malignancy process   Chronic atrial fibrillation on chronic anticoagulation -Patient appears to be in atrial fibrillation  and currently rate controlled. -Continue metoprolol   -Hold Xarelto in case he was to get an EBUS but this looks like this could be done in outpatient setting as the pulmonary note indicates that [facility] to perform biopsy of lung nodule on outpatient basis and he has been placed on a heparin drip as below -Heparin per pharmacy   History of DVT/PE -Patient with prior history of DVT back in 2018 on the right leg, and  CTA of the chest from 03/21/2022 noted concern for scattered tiny pulmonary emboli are favored to be sequela of prior pulmonary embolism. -Hold Xarelto -Heparin per pharmacy for possible need of procedure   Controlled diabetes mellitus type 2, without long-term use of insulin -On admission glucose 103.  Glucose last hemoglobin A1c 6.6 on 08/09/2021.  Home medications include Jardiance 10 mg daily. -Hypoglycemic protocol -Continue Jardiance -CBGs before every meal with sensitive SSI -Continue to monitor and discontinue sliding scale -CBGs ranging from 68-132   Heart failure with reduced EF -Chronic patient appears relatively euvolemic at this time without significant lower extremity edema or signs of JVD. Last EF noted to be 45 to 50% with determinate diastolic parameters 06/4399. -Strict I&Os and daily weights   Essential hypertension -Blood pressures currently maintained 97/77-169/100. -Continue current blood pressure regimen of furosemide, losartan, and metoprolol.   History of CVA with residual deficit -Patient with history of prior  stroke with residual right-sided weakness and expressive aphasia -Continue aspirin and statin   Iron deficiency Chronic.  Labs significant for hemoglobin 13.3 with MCH low at 24 with elevated RDW.  Patient with prior history of iron deficiency in the distant past. -Check iron studies in a.m. -Continue to monitor blood count   Hyperlipidemia -Continue simvastatin   Microcytic Anemia -Patient's Hgb/Hct Trend: Recent Labs  Lab 05/10/22 2029 05/12/22 0243 05/13/22 0443  HGB 13.2 11.7* 11.5*  HCT 44.1 37.8* 37.1*  MCV 80.2 79.1* 77.9*  -Anemia panel done and showed an iron level of 64, UIBC of 265, TIBC of 329, saturation ratios of 20%, ferritin level 267 -Continue to monitor for signs and symptoms bleeding; no overt bleeding noted Repeat CBC in a.m.   DVT prophylaxis: On a heparin drip    Code Status: Full Code Family Communication: No family at bedside  Disposition Plan:  Level of care: Med-Surg Status is: Inpatient Remains inpatient appropriate because: Needs a safe discharge disposition and evaluation by PT OT and discussion with the orthopedic surgery team   Consultants:  Interventional radiology Pulmonology  Procedures:  As delineated as above  Antimicrobials:  Anti-infectives (From admission, onward)    None       Subjective: Seen and examined at bedside continues to have some right shoulder pain.  Denies any nausea or vomiting.  Feels okay.  No other concerns or complaints at this time.  Objective: Vitals:   05/12/22 2031 05/13/22 0809 05/13/22 1144 05/13/22 1558  BP: 112/78  98/66 101/75 109/68  Pulse: 61 68 (!) 53 (!) 55  Resp: 20 20 14 17   Temp: 06.2 F (36.6 C) 97.9 F (36.6 C) 98.1 F (36.7 C) 97.7 F (36.5 C)  TempSrc:  Oral Oral Oral  SpO2: 100% 100% 99% 100%  Weight:      Height:        Intake/Output Summary (Last 24 hours) at 05/13/2022 1949 Last data filed at 05/13/2022 1614 Gross per 24 hour  Intake --  Output 1200 ml  Net -1200  ml   Filed Weights   05/10/22 1827  Weight: 94.3 kg   Examination: Physical Exam:  Constitutional: Chronically ill-appearing elderly African-American male currently no acute distress Respiratory: Diminished to auscultation bilaterally, no wheezing, rales, rhonchi or crackles. Normal respiratory effort and patient is not tachypenic. No accessory muscle use.  Unlabored breathing Cardiovascular: RRR, no murmurs / rubs / gallops. S1 and S2 auscultated. No extremity edema.  Abdomen: Soft, non-tender, mildly distended secondary to body habitus.  Bowel sounds positive.  GU: Deferred. Musculoskeletal: No clubbing / cyanosis of digits/nails. No joint deformity upper and lower extremities but has difficulty raising his right shoulder Skin: No rashes, lesions, ulcers. No induration; Warm and dry.  Neurologic: Continues to have some dysarthria and hemiplegia  Psychiatric: He is awake and alert  Data Reviewed: I have personally reviewed following labs and imaging studies  CBC: Recent Labs  Lab 05/10/22 2029 05/12/22 0243 05/13/22 0443  WBC 5.2 3.9* 3.3*  NEUTROABS 3.6  --   --   HGB 13.2 11.7* 11.5*  HCT 44.1 37.8* 37.1*  MCV 80.2 79.1* 77.9*  PLT 186 149* 376   Basic Metabolic Panel: Recent Labs  Lab 05/10/22 2029 05/12/22 0243  NA 136 137  K 4.0 3.9  CL 100 104  CO2 24 25  GLUCOSE 103* 116*  BUN 30* 37*  CREATININE 1.23 1.14  CALCIUM 9.8 9.2   GFR: Estimated Creatinine Clearance: 67.1 mL/min (by C-G formula based on SCr of 1.14 mg/dL). Liver Function Tests: Recent Labs  Lab 05/10/22 2029  AST 23  ALT 20  ALKPHOS 68  BILITOT 0.8  PROT 8.2*  ALBUMIN 4.3   No results for input(s): "LIPASE", "AMYLASE" in the last 168 hours. No results for input(s): "AMMONIA" in the last 168 hours. Coagulation Profile: Recent Labs  Lab 05/10/22 2029  INR 1.1   Cardiac Enzymes: No results for input(s): "CKTOTAL", "CKMB", "CKMBINDEX", "TROPONINI" in the last 168 hours. BNP (last 3  results) No results for input(s): "PROBNP" in the last 8760 hours. HbA1C: Recent Labs    05/11/22 0745  HGBA1C 7.2*   CBG: Recent Labs  Lab 05/12/22 2029 05/13/22 0812 05/13/22 1146 05/13/22 1214 05/13/22 1610  GLUCAP 90 96 68* 79 132*   Lipid Profile: No results for input(s): "CHOL", "HDL", "LDLCALC", "TRIG", "CHOLHDL", "LDLDIRECT" in the last 72 hours. Thyroid Function Tests: No results for input(s): "TSH", "T4TOTAL", "FREET4", "T3FREE", "THYROIDAB" in the last 72 hours. Anemia Panel: Recent Labs    05/12/22 0243  FERRITIN 267  TIBC 329  IRON 64   Sepsis Labs: No results for input(s): "PROCALCITON", "LATICACIDVEN" in the last 168 hours.  Recent Results (from the past 240 hour(s))  Resp panel by RT-PCR (RSV, Flu A&B, Covid) Anterior Nasal Swab     Status: None   Collection Time: 05/10/22  8:29 PM   Specimen: Anterior Nasal Swab  Result Value Ref Range Status   SARS Coronavirus 2 by RT PCR NEGATIVE NEGATIVE Final  Comment: (NOTE) SARS-CoV-2 target nucleic acids are NOT DETECTED.  The SARS-CoV-2 RNA is generally detectable in upper respiratory specimens during the acute phase of infection. The lowest concentration of SARS-CoV-2 viral copies this assay can detect is 138 copies/mL. A negative result does not preclude SARS-Cov-2 infection and should not be used as the sole basis for treatment or other patient management decisions. A negative result may occur with  improper specimen collection/handling, submission of specimen other than nasopharyngeal swab, presence of viral mutation(s) within the areas targeted by this assay, and inadequate number of viral copies(<138 copies/mL). A negative result must be combined with clinical observations, patient history, and epidemiological information. The expected result is Negative.  Fact Sheet for Patients:  EntrepreneurPulse.com.au  Fact Sheet for Healthcare Providers:   IncredibleEmployment.be  This test is no t yet approved or cleared by the [place] FDA and  has been authorized for detection and/or diagnosis of SARS-CoV-2 by FDA under an Emergency Use Authorization (EUA). This EUA will remain  in effect (meaning this test can be used) for the duration of the COVID-19 declaration under Section 564(b)(1) of the Act, 21 U.S.C.section 360bbb-3(b)(1), unless the authorization is terminated  or revoked sooner.       Influenza A by PCR NEGATIVE NEGATIVE Final   Influenza B by PCR NEGATIVE NEGATIVE Final    Comment: (NOTE) The Xpert Xpress SARS-CoV-2/FLU/RSV plus assay is intended as an aid in the diagnosis of influenza from Nasopharyngeal swab specimens and should not be used as a sole basis for treatment. Nasal washings and aspirates are unacceptable for Xpert Xpress SARS-CoV-2/FLU/RSV testing.  Fact Sheet for Patients: EntrepreneurPulse.com.au  Fact Sheet for Healthcare Providers: IncredibleEmployment.be  This test is not yet approved or cleared by the [place] FDA and has been authorized for detection and/or diagnosis of SARS-CoV-2 by FDA under an Emergency Use Authorization (EUA). This EUA will remain in effect (meaning this test can be used) for the duration of the COVID-19 declaration under Section 564(b)(1) of the Act, 21 U.S.C. section 360bbb-3(b)(1), unless the authorization is terminated or revoked.     Resp Syncytial Virus by PCR NEGATIVE NEGATIVE Final    Comment: (NOTE) Fact Sheet for Patients: EntrepreneurPulse.com.au  Fact Sheet for Healthcare Providers: IncredibleEmployment.be  This test is not yet approved or cleared by the [place] FDA and has been authorized for detection and/or diagnosis of SARS-CoV-2 by FDA under an Emergency Use Authorization (EUA). This EUA will remain in effect (meaning this test can be used) for  the duration of the COVID-19 declaration under Section 564(b)(1) of the Act, 21 U.S.C. section 360bbb-3(b)(1), unless the authorization is terminated or revoked.  Performed at [facility], [address] [place], [place] [address]     Radiology Studies: No results found.  Scheduled Meds:  allopurinol  100 mg Oral Daily   aspirin EC  81 mg Oral Daily   ferrous sulfate  325 mg Oral Q breakfast   furosemide  40 mg Oral BID   gabapentin  400 mg Oral TID   insulin aspart  0-9 Units Subcutaneous TID WC   ketorolac  15 mg Intravenous Q8H   latanoprost  1 drop Both Eyes QHS   lidocaine  1 patch Transdermal Q12H   losartan  50 mg Oral Daily   metoprolol succinate  12.5 mg Oral Daily   morphine  15 mg Oral Q12H   potassium chloride SA  20 mEq Oral Daily   simvastatin  20 mg Oral QHS  sodium chloride flush  3 mL Intravenous Q12H   Continuous Infusions:  heparin 1,550 Units/hr (05/13/22 7893)    LOS: 2 days   Raiford Noble, DO [facility] Available via Epic secure chat 7am-7pm After these hours, please refer to coverage provider listed on [email or web address] 05/13/2022, 7:49 PM

## 2022-05-13 NOTE — Progress Notes (Signed)
TOC consulted for medication assistance, however, pt not eligible due to pt having commercial insurance.
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
## 2022-05-13 NOTE — Progress Notes (Signed)
ANTICOAGULATION CONSULT NOTE   Pharmacy Consult for IV heparin Indication: pulmonary embolus  No Known Allergies  Patient Measurements: Height: 6\' 2"  (188 cm) Weight: 94.3 kg (208 lb) IBW/kg (Calculated) : 82.2 Heparin Dosing Weight: 94.3 kg  Vital Signs: Temp: 97.9 F (36.6 C) (12/27 2031) BP: 112/78 (12/27 2031) Pulse Rate: 61 (12/27 2031)  Labs: Recent Labs    05/10/22 2029 05/11/22 0421 05/11/22 0745 05/12/22 0243 05/12/22 1242 05/13/22 0443  HGB 13.2  --   --  11.7*  --  11.5*  HCT 44.1  --   --  37.8*  --  37.1*  PLT 186  --   --  149*  --  152  APTT  --   --    < > 99* 97* 129*  LABPROT 14.3  --   --   --   --   --   INR 1.1  --   --   --   --   --   HEPARINUNFRC  --   --   --  0.66 0.73* 0.75*  CREATININE 1.23  --   --  1.14  --   --   TROPONINIHS 17 15  --   --   --   --    < > = values in this interval not displayed.     Estimated Creatinine Clearance: 67.1 mL/min (by C-G formula based on SCr of 1.14 mg/dL).   Medical History: Past Medical History:  Diagnosis Date   Arthritis    Atrial fibrillation ([place])    Cancer ([place])    Prostate   CHF (congestive heart failure) ([facility])    Diabetes mellitus without complication ([place])    Gout current and history of   History of cocaine abuse ([place])    + UDS on admission   History of deep vein thrombosis 2006   History of tobacco abuse    has smoked for 50 years   Hypertension    Pulmonary embolism ([place]) 2010   Renal infarct ([name]) 03/2019   left renal infarct   Stroke ([place]) 2017   affected speech and right side-uses cane   Varicose veins of both lower extremities    Wears dentures    full upper, partial lower    Medications:   Xarelto 15 mg daily (last fill 04/2022): last dose 05/09/2022 at 1700  Assessment: 71 year old male presenting with severe right shoulder pain with previously planned surgery for 05/14/2022. PMH includes HFrEF (45-50%), hx of DVT/PE, CVA, renal infarct, and tobacco abuse. Starting  IV heparin for previous PE ISO upcoming surgery.  CT angiogram of the chest was obtained which identified nonocclusive linear and bandlike scarring within the pulmonary arteries sequela of prior PEs, a right apical pleural-based mass with erosive changes of the right first rib consistent with malignancy, right hilar adenopathy thought to be likely metastatic disease, and faint sclerotic foci at T11 that was thought to be nonspecific.   INR elevated at 1.1. aPTT 31. Baseline Hgb and Platelets stable.  Goal of Therapy:  aPTT 66-102 seconds Monitor platelets by anticoagulation protocol: Yes   1226 1600 aPTT 64 sec, subtherapeutic @ 1500 units/hr 12/27 0252 aPTT 99, HL 0.66, therapeutic x 1 12/27 1242 aPTT 97; HL 0.73, therapeutic x 2  12/28 0443 aPTT 129, HL 0.75 supratherapeutic  Plan:  Decrease heparin infusion to 1550 units/hr Recheck HL in 8 hrs after rate change Continue to monitor H&H and platelets  Renda Rolls, PharmD, [facility] 05/13/2022 6:34  AM

## 2022-05-13 NOTE — Progress Notes (Addendum)
ANTICOAGULATION CONSULT NOTE   Pharmacy Consult for IV heparin Indication: pulmonary embolus  No Known Allergies  Patient Measurements: Height: 6\' 2"  (188 cm) Weight: 94.3 kg (208 lb) IBW/kg (Calculated) : 82.2 Heparin Dosing Weight: 94.3 kg  Vital Signs: Temp: 97.7 F (36.5 C) (12/28 1558) Temp Source: Oral (12/28 1144) BP: 109/68 (12/28 1558) Pulse Rate: 55 (12/28 1558)  Labs: Recent Labs    05/10/22 2029 05/11/22 0421 05/11/22 0745 05/12/22 0243 05/12/22 1242 05/13/22 0443 05/13/22 1519  HGB 13.2  --   --  11.7*  --  11.5*  --   HCT 44.1  --   --  37.8*  --  37.1*  --   PLT 186  --   --  149*  --  152  --   APTT  --   --    < > 99* 97* 129*  --   LABPROT 14.3  --   --   --   --   --   --   INR 1.1  --   --   --   --   --   --   HEPARINUNFRC  --   --    < > 0.66 0.73* 0.75* 0.61  CREATININE 1.23  --   --  1.14  --   --   --   TROPONINIHS 17 15  --   --   --   --   --    < > = values in this interval not displayed.     Estimated Creatinine Clearance: 67.1 mL/min (by C-G formula based on SCr of 1.14 mg/dL).   Medical History: Past Medical History:  Diagnosis Date   Arthritis    Atrial fibrillation ([place])    Cancer ([place])    Prostate   CHF (congestive heart failure) ([facility])    Diabetes mellitus without complication ([place])    Gout current and history of   History of cocaine abuse ([place])    + UDS on admission   History of deep vein thrombosis 2006   History of tobacco abuse    has smoked for 50 years   Hypertension    Pulmonary embolism ([place]) 2010   Renal infarct ([place]) 03/2019   left renal infarct   Stroke ([place]) 2017   affected speech and right side-uses cane   Varicose veins of both lower extremities    Wears dentures    full upper, partial lower    Medications:   Xarelto 15 mg daily (last fill 04/2022): last dose 05/09/2022 at 1700  Assessment: 71 year old male presenting with severe right shoulder pain with previously planned surgery for  05/14/2022. PMH includes HFrEF (45-50%), hx of DVT/PE, CVA, renal infarct, and tobacco abuse. Starting IV heparin for previous PE ISO upcoming surgery.  CT angiogram of the chest was obtained which identified nonocclusive linear and bandlike scarring within the pulmonary arteries sequela of prior PEs, a right apical pleural-based mass with erosive changes of the right first rib consistent with malignancy, right hilar adenopathy thought to be likely metastatic disease, and faint sclerotic foci at T11 that was thought to be nonspecific.   INR elevated at 1.1. aPTT 31. Baseline Hgb and Platelets stable.  Goal of Therapy:  aPTT 66-102 seconds Monitor platelets by anticoagulation protocol: Yes   1226 1600 aPTT 64 sec, subtherapeutic @ 1500 units/hr 12/27 0252 aPTT 99, HL 0.66, therapeutic x 1 12/27 1242 aPTT 97; HL 0.73, therapeutic x 2  12/28 0443 aPTT 129, HL  0.75 supratherapeutic -- heparin level correlating 12/28 1500 HL 0.6, therapeutic  Plan:  Continue heparin infusion at 1550 units/hr Will transition to dosing via daily heparin levels. Will order aPTT as well to re-confirm correlation tomorrow AM. Continue to monitor H&H and platelets   Glean Salvo, PharmD, BCPS Clinical Pharmacist  05/13/2022 4:05 PM

## 2022-05-14 DIAGNOSIS — C3411 Malignant neoplasm of upper lobe, right bronchus or lung: Secondary | ICD-10-CM | POA: Diagnosis not present

## 2022-05-14 DIAGNOSIS — M25511 Pain in right shoulder: Secondary | ICD-10-CM | POA: Diagnosis not present

## 2022-05-14 DIAGNOSIS — S46219A Strain of muscle, fascia and tendon of other parts of biceps, unspecified arm, initial encounter: Secondary | ICD-10-CM | POA: Diagnosis not present

## 2022-05-14 DIAGNOSIS — Z7901 Long term (current) use of anticoagulants: Secondary | ICD-10-CM | POA: Diagnosis not present

## 2022-05-14 LAB — COMPREHENSIVE METABOLIC PANEL
ALT: 14 U/L (ref 0–44)
AST: 18 U/L (ref 15–41)
Albumin: 3.4 g/dL — ABNORMAL LOW (ref 3.5–5.0)
Alkaline Phosphatase: 57 U/L (ref 38–126)
Anion gap: 11 (ref 5–15)
BUN: 40 mg/dL — ABNORMAL HIGH (ref 8–23)
CO2: 25 mmol/L (ref 22–32)
Calcium: 9.3 mg/dL (ref 8.9–10.3)
Chloride: 101 mmol/L (ref 98–111)
Creatinine, Ser: 1.38 mg/dL — ABNORMAL HIGH (ref 0.61–1.24)
GFR, Estimated: 54 mL/min — ABNORMAL LOW (ref >60)
Glucose, Bld: 99 mg/dL (ref 70–99)
Potassium: 4.6 mmol/L (ref 3.5–5.1)
Sodium: 137 mmol/L (ref 135–145)
Total Bilirubin: 0.5 mg/dL (ref 0.3–1.2)
Total Protein: 6.9 g/dL (ref 6.5–8.1)

## 2022-05-14 LAB — CBC WITH DIFFERENTIAL/PLATELET
Abs Immature Granulocytes: 0.02 10*3/uL (ref 0.00–0.07)
Basophils Absolute: 0 10*3/uL (ref 0.0–0.1)
Basophils Relative: 0 %
Eosinophils Absolute: 0.1 10*3/uL (ref 0.0–0.5)
Eosinophils Relative: 2 %
HCT: 38.1 % — ABNORMAL LOW (ref 39.0–52.0)
Hemoglobin: 11.8 g/dL — ABNORMAL LOW (ref 13.0–17.0)
Immature Granulocytes: 1 %
Lymphocytes Relative: 24 %
Lymphs Abs: 0.8 10*3/uL (ref 0.7–4.0)
MCH: 24.5 pg — ABNORMAL LOW (ref 26.0–34.0)
MCHC: 31 g/dL (ref 30.0–36.0)
MCV: 79 fL — ABNORMAL LOW (ref 80.0–100.0)
Monocytes Absolute: 0.5 10*3/uL (ref 0.1–1.0)
Monocytes Relative: 15 %
Neutro Abs: 1.9 10*3/uL (ref 1.7–7.7)
Neutrophils Relative %: 58 %
Platelets: 149 10*3/uL — ABNORMAL LOW (ref 150–400)
RBC: 4.82 MIL/uL (ref 4.22–5.81)
RDW: 17.1 % — ABNORMAL HIGH (ref 11.5–15.5)
WBC: 3.2 10*3/uL — ABNORMAL LOW (ref 4.0–10.5)
nRBC: 0 % (ref 0.0–0.2)

## 2022-05-14 LAB — HEPARIN LEVEL (UNFRACTIONATED): Heparin Unfractionated: 0.57 IU/mL (ref 0.30–0.70)

## 2022-05-14 LAB — APTT: aPTT: 112 seconds — ABNORMAL HIGH (ref 24–36)

## 2022-05-14 LAB — GLUCOSE, CAPILLARY
Glucose-Capillary: 65 mg/dL — ABNORMAL LOW (ref 70–99)
Glucose-Capillary: 81 mg/dL (ref 70–99)
Glucose-Capillary: 87 mg/dL (ref 70–99)
Glucose-Capillary: 65 mg/dL — ABNORMAL LOW (ref 70–99)
Glucose-Capillary: 117 mg/dL — ABNORMAL HIGH (ref 70–99)
Glucose-Capillary: 33 mg/dL — CL (ref 70–99)
Glucose-Capillary: 45 mg/dL — ABNORMAL LOW (ref 70–99)
Glucose-Capillary: 70 mg/dL (ref 70–99)
Glucose-Capillary: 80 mg/dL (ref 70–99)

## 2022-05-14 LAB — PHOSPHORUS: Phosphorus: 4.5 mg/dL (ref 2.5–4.6)

## 2022-05-14 LAB — MAGNESIUM: Magnesium: 2.4 mg/dL (ref 1.7–2.4)

## 2022-05-14 MED ORDER — LIDOCAINE 5 % EX PTCH: 1.0000 | MEDICATED_PATCH | Freq: Two times a day (BID) | CUTANEOUS | 0 refills | Status: DC

## 2022-05-14 MED ORDER — MORPHINE SULFATE ER 15 MG PO TBCR: 15.0000 mg | EXTENDED_RELEASE_TABLET | Freq: Two times a day (BID) | ORAL | 0 refills | Status: DC

## 2022-05-14 MED ORDER — RIVAROXABAN 20 MG PO TABS: 20.0000 mg | ORAL_TABLET | Freq: Every day | ORAL | 0 refills | Status: DC

## 2022-05-14 MED ORDER — RIVAROXABAN 20 MG PO TABS
20.0000 mg | ORAL_TABLET | Freq: Every day | ORAL | Status: DC
  Administered 2022-05-14: 20 mg via ORAL
  Filled 2022-05-14: qty 1

## 2022-05-14 MED ORDER — OXYCODONE HCL 5 MG PO TABS: 5.0000 mg | ORAL_TABLET | Freq: Four times a day (QID) | ORAL | 0 refills | Status: DC | PRN

## 2022-05-14 MED ORDER — ONDANSETRON HCL 4 MG PO TABS: 4.0000 mg | ORAL_TABLET | Freq: Four times a day (QID) | ORAL | 0 refills | Status: DC | PRN

## 2022-05-14 NOTE — Discharge Summary (Signed)
Physician Discharge Summary   Patient: [name] MRN: [id number] DOB: 06/19/1950  Admit date:     05/11/2022  Discharge date: 05/14/22  Discharge Physician: Raiford Noble   PCP: Theotis Burrow, MD   Recommendations at discharge:   Follow-up with PCP within 1 to 2 weeks and repeat CBC, CMP, mag, Phos within 1 week Follow-up with pulmonary in outpatient setting for outpatient lung nodule biopsy Follow-up with orthopedic surgery Dr. 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 *  Hospital Course: HPI per Dr. Jon Billings Terlecki is a 71 y.o. 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  He has been evaluated by orthopedic surgery and had recent MRI of the shoulder 11/30 which showed longitudinal tearing of the long head of the biceps tendon in the proximal bicipital groove, mild supraspinatus and subscapularis tendinopathy, deformity of the distal clavicle, likely old fracture, moderate to severe degenerative glenohumeral arthropathy, and mild synovitis along the rotator interval.  Plans were made for surgery on 05/14/2022 with orthopedics.  He reports that at home he was  having severe pain and only receiving 1 to 2 hours of relief with the pain medications as given.  Review of PMDP notes given 14 pills of tramadol 50 mg last on 12/18.  He was having associated symptoms of numbness.  At baseline patient ambulates with use of a cane and lives alone, but reported having difficulty caring for himself due to his symptoms.  He does still smoke approximately 4 cigarettes/day on average.   In the emergency department patient was noted to be afebrile with blood pressure was 127/92 -169/100.  Labs noted hemoglobin 13.2, MCH 24, RDW 17.2, BUN 30, creatinine 1.23, and high-sensitivity troponins negative x 2.  Chest x-ray noted asymmetric right apical opacity concerning for Pancoast tumor and stable cardiomegaly.  CT angiogram of the chest was obtained which identified a right apical pleural-based mass with erosive changes of the right first rib consistent with malignancy, right hilar adenopathy thought to be likely metastatic disease, and faint sclerotic foci at T11 that was thought to be nonspecific.  Patient has been given Zofran, ketorolac, and Dilaudid IV for pain.   **Interim History  Continues to have shoulder pain.  After further evaluation pulmonary feels that he can have an outpatient biopsy of his lung nodule and the interventional radiologist feels that there is no VIR intervention required at this time but they feel that if there is inadequate sampling that they may submit a VIR biopsy request for percutaneous biopsy in the ambulatory setting  I discussed the case with orthopedics and they have nothing else to offer the patient inpatient.  PT OT evaluated and recommended no follow-up.  He is deemed medically stable given  that his pain was well-controlled with his current regimen.  He will have an outpatient biopsy for concern for Pancoast tumor as well as outpatient evaluation with Dr. Roland Rack for consideration of Shoulder repair.  He is medically stable to be discharged at  this time.  Assessment and Plan:  Right shoulder pain  Biceps tendon tear -Acute on chronic.   -Patient has been having right shoulder pain since back in September, but recently noted to be more severe.   -Evaluated and has been seen by orthopedics with recent MRI showing biceps tendon tear abnormalities for which patient was scheduled to have surgery with orthopedics on 12/29.  Question if patient's symptoms -Admit to MedSurg bed -Oxycodone/Dilaudid IV as needed for moderate to severe pain -Continue gabapentin and currently he is getting scheduled Toradol IV every 8 hours for 5 doses -Consult Orthopedic Surgery to see if there is anything else that can be done besides pain control; he is supposed to follow-up with Dr. Roland Rack after discharge and received steroid injection in the shoulder on 04/06/2022 and orthopedic surgery team reported that they were unable to pursue operative intervention until 3 months after steroid injections at least and I discussed the case with orthopedic surgery team and they have no further inpatient recommendations -Will obtain PT and OT evaluation prior to safe discharge disposition they recommended no home health   Suspected Pancoast tumor -Acute.   -Chest x-ray gave concern for right apical opacity.   -CT angiogram of the chest noted right apical pleural-based mass with erosive changes of the right first rib consistent with malignancy, right hilar adenopathy thought to be likely metastatic disease, and faint sclerotic foci at T11 that was thought to be nonspecific. -Initially consulted IR for possible biopsy, but was advised to consult PCCM for possible endobronchial biopsy -Pulmonary recommends outpatient bronchoscopy and tissue sampling -Patient will need to be set up with hematology oncology eventually -He continues to have a slightly low WBC as well as borderline thrombocytopenia that may or may not be related to the malignancy process -Patient to follow-up  with pulmonology in outpatient setting for bronchoscopy and tissue sampling   Chronic atrial fibrillation on chronic anticoagulation -Patient appears to be in atrial fibrillation  and currently rate controlled. -Continue metoprolol   -Hold Xarelto in case he was to get an EBUS but this looks like this could be done in outpatient setting as the pulmonary note indicates that [facility] to perform biopsy of lung nodule on outpatient basis and he has been placed on a heparin drip as below -Heparin per pharmacy now being transition back to Xarelto   History of DVT/PE -Patient with prior history of DVT back in 2018 on the right leg, and  CTA of the chest from 03/21/2022 noted concern for scattered tiny pulmonary emboli are favored to be sequela of prior pulmonary embolism. -Hold Xarelto initially but given that he does not need any acute surgical intervention not going under biopsy can be transition back to oral -Heparin per pharmacy for possible need of procedure   Controlled diabetes mellitus type 2, without long-term use of insulin -On admission glucose 103.  Glucose last hemoglobin A1c 6.6 on 08/09/2021.  Home medications include Jardiance 10 mg daily. -Hypoglycemic protocol -Continue Jardiance -CBGs before every meal with sensitive SSI -Continue to monitor and discontinue sliding scale -CBGs ranging from 68-132   Heart failure with reduced EF -Chronic patient appears relatively euvolemic at this time without significant lower extremity edema or signs of JVD. Last EF  noted to be 45 to 50% with determinate diastolic parameters 05/6604. -Strict I&Os and daily weights   Essential hypertension -Blood pressures currently maintained 97/77-169/100. -Continue current blood pressure regimen of furosemide, losartan, and metoprolol.   History of CVA with residual deficit -Patient with history of prior stroke with residual right-sided weakness and expressive aphasia -Continue aspirin and statin   Iron  deficiency Chronic.  Labs significant for hemoglobin 13.3 with MCH low at 24 with elevated RDW.  Patient with prior history of iron deficiency in the distant past. -See Below -Continue to monitor blood count   Hyperlipidemia -Continue simvastatin  Mild renal insufficiency -BUN/Cr Trend: Recent Labs  Lab 05/10/22 2029 05/12/22 0243 05/14/22 0453  BUN 30* 37* 40*  CREATININE 1.23 1.14 1.38*  -Avoid Nephrotoxic Medications, Contrast Dyes, Hypotension and Dehydration to Ensure Adequate Renal Perfusion and will need to Renally Adjust Meds -Continue to Monitor and Trend Renal Function carefully and repeat CMP in the outpatient setting  Hypoalbuminemia -Patient's Albumin is now gone from 4.3 -> 3.4 -Continue to Monitor and Trend -Repeat CMP in the AM   Thrombocytopenia -Patient's Plt Count Trend: Recent Labs  Lab 05/10/22 2029 05/12/22 0243 05/13/22 0443 05/14/22 0453  PLT 186 149* 152 149*  -Continue to Monitor and Trend and Repeat CBC within 1 week   Microcytic Anemia -Patient's Hgb/Hct Trend: Recent Labs  Lab 05/10/22 2029 05/12/22 0243 05/13/22 0443 05/14/22 0453  HGB 13.2 11.7* 11.5* 11.8*  HCT 44.1 37.8* 37.1* 38.1*  -Anemia panel done and showed an iron level of 64, UIBC of 265, TIBC of 329, saturation ratios of 20%, ferritin level 267 -Continue to monitor for signs and symptoms bleeding; no overt bleeding noted Repeat CBC in a.m.    Pain control - [employer] Controlled Substance Reporting System database was reviewed. and patient was instructed, not to drive, operate heavy machinery, perform activities at heights, swimming or participation in water activities or provide baby-sitting services while on Pain, Sleep and Anxiety Medications; until their outpatient Physician has advised to do so again. Also recommended to not to take more than prescribed Pain, Sleep and Anxiety Medications.   Consultants: Pulmonary and interventional radiology; discussed the case  with orthopedic surgery Procedures performed: As above Disposition: Home Diet recommendation:  Discharge Diet Orders (From admission, onward)     Start     Ordered   05/14/22 0000  Diet - low sodium heart healthy        05/14/22 1658   05/14/22 0000  Diet Carb Modified        05/14/22 1658           Cardiac and Carb modified diet DISCHARGE MEDICATION: Allergies as of 05/14/2022   No Known Allergies      Medication List     STOP taking these medications    oxyCODONE-acetaminophen 5-325 MG tablet Commonly known as: Percocet   traMADol 50 MG tablet Commonly known as: ULTRAM       TAKE these medications    acetaminophen 325 MG tablet Commonly known as: TYLENOL Take 1-2 tablets (325-650 mg total) by mouth every 4 (four) hours as needed for mild pain.   albuterol 108 (90 Base) MCG/ACT inhaler Commonly known as: VENTOLIN HFA Inhale into the lungs every 6 (six) hours as needed for wheezing or shortness of breath.   allopurinol 100 MG tablet Commonly known as: ZYLOPRIM Take 100 mg by mouth daily.   aspirin EC 81 MG tablet Take 1 tablet (81 mg total) by mouth  daily.   colchicine 0.6 MG tablet Take 1 tablet (0.6 mg total) by mouth daily. Stop taking when your symptoms resolve.   diclofenac Sodium 1 % Gel Commonly known as: VOLTAREN Apply topically.   FeroSul 325 (65 FE) MG tablet Generic drug: ferrous sulfate Take 0.5 tablets by mouth daily with breakfast.   furosemide 40 MG tablet Commonly known as: LASIX Take 40 mg by mouth 2 (two) times daily.   gabapentin 400 MG capsule Commonly known as: NEURONTIN Take 400 mg by mouth 3 (three) times daily.   Jardiance 10 MG Tabs tablet Generic drug: empagliflozin Take 1 tablet (10 mg total) by mouth daily.   latanoprost 0.005 % ophthalmic solution Commonly known as: XALATAN Place 1 drop into both eyes at bedtime.   lidocaine 5 % Commonly known as: Lidoderm Place 1 patch onto the skin every 12 (twelve)  hours. Remove & Discard patch within 12 hours or as directed by MD   losartan 50 MG tablet Commonly known as: COZAAR Take 50 mg by mouth daily.   metoprolol succinate 25 MG 24 hr tablet Commonly known as: TOPROL-XL Take 12.5 mg by mouth daily.   MiraLax 17 GM/SCOOP powder Generic drug: polyethylene glycol powder Take 17 g by mouth daily.   morphine 15 MG 12 hr tablet Commonly known as: MS CONTIN Take 1 tablet (15 mg total) by mouth every 12 (twelve) hours.   ondansetron 4 MG tablet Commonly known as: ZOFRAN Take 1 tablet (4 mg total) by mouth every 6 (six) hours as needed for nausea.   oxyCODONE 5 MG immediate release tablet Commonly known as: Oxy IR/ROXICODONE Take 1 tablet (5 mg total) by mouth every 6 (six) hours as needed for breakthrough pain (MS contin).   potassium chloride SA 20 MEQ tablet Commonly known as: KLOR-CON M Take 1 tablet (20 mEq total) by mouth 2 (two) times daily. What changed: when to take this   rivaroxaban 20 MG Tabs tablet Commonly known as: XARELTO Take 1 tablet (20 mg total) by mouth daily with supper. Start taking on: May 15, 2022 What changed:  medication strength how much to take when to take this   sildenafil 20 MG tablet Commonly known as: REVATIO Take 3-5 tablets 1 hour prior to intercourse as needed   simvastatin 20 MG tablet Commonly known as: ZOCOR Take 20 mg by mouth at bedtime.        Follow-up Information     Revelo, Elyse Jarvis, MD. Call.   Specialty: Family Medicine Why: Follow up within 1-2 weeks Contact information: [address] [place] [address] [phone]         Ottie Glazier, MD Follow up.   Specialty: Pulmonary Disease Why: Follow up for Outpaitent Lung Biopsy Contact information: [place] [address] [phone]         Corky Mull, MD. Call.   Specialty: Orthopedic Surgery Why: Follow up in the outpatient setting for Shoulder Surgery and  Hospital Follow up Contact information: [place] [address] [phone]                Discharge Exam: [facility] Weights   05/10/22 1827  Weight: 94.3 kg   Vitals:   05/14/22 0748 05/14/22 1509  BP: 106/63 109/77  Pulse: (!) 57 60  Resp: 17 19  Temp: 97.8 F (36.6 C) 98 F (36.7 C)  SpO2: 100% 99%   Examination: Physical Exam:  Constitutional: WN/WD slightly  overweight African-American male in no acute distress Respiratory: Diminished to auscultation bilaterally, no wheezing, rales, rhonchi or crackles. Normal respiratory effort and patient is not tachypenic. No accessory muscle use.  Unlabored breathing Cardiovascular: RRR, no murmurs / rubs / gallops. S1 and S2 auscultated. Abdomen: Soft, non-tender, non-distended. No masses palpated.  GU: Deferred. Musculoskeletal: No clubbing / cyanosis of digits/nails. No joint deformity upper and lower extremities but has some right shoulder also range of motion and some hemiplegia Skin: No rashes, lesions, ulcers limited skin evaluation. No induration; Warm and dry.  Neurologic: CN 2-12 grossly intact with no focal deficits.  Romberg sign and cerebellar reflexes not assessed.  Psychiatric: Normal judgment and insight. Alert and oriented x 3. Normal mood and appropriate affect.   Condition at discharge: stable  The results of significant diagnostics from this hospitalization (including imaging, microbiology, ancillary and laboratory) are listed below for reference.   Imaging Studies: CT Angio Chest PE W and/or Wo Contrast  Result Date: 05/11/2022 CLINICAL DATA:  Concern for pulmonary embolism. EXAM: CT ANGIOGRAPHY CHEST WITH CONTRAST TECHNIQUE: Multidetector CT imaging of the chest was performed using the standard protocol during bolus administration of intravenous contrast. Multiplanar CT image reconstructions and MIPs were obtained to evaluate the vascular anatomy. RADIATION DOSE  REDUCTION: This exam was performed according to the departmental dose-optimization program which includes automated exposure control, adjustment of the mA and/or kV according to patient size and/or use of iterative reconstruction technique. CONTRAST:  21mL OMNIPAQUE IOHEXOL 350 MG/ML SOLN COMPARISON:  Chest CT dated 03/21/2022. FINDINGS: Cardiovascular: There is mild cardiomegaly with biatrial dilatation. No pericardial effusion. Three-vessel coronary vascular calcification. Mild atherosclerotic calcification of the thoracic aorta. No aneurysmal dilatation. Nonocclusive linear and bandlike scarring within the pulmonary arteries sequela of prior PEs. No acute pulmonary artery embolus. No CT evidence of right heart straining. Mediastinum/Nodes: Right hilar adenopathy measures 2.5 cm in short axis similar to prior CT. No mediastinal adenopathy. The esophagus is grossly unremarkable. No mediastinal fluid collection. Lungs/Pleura: There is a 3.2 x 5.1 cm right apical pleural based mass with erosive changes of the first rib consistent with malignancy. No pleural effusion or pneumothorax. A 9 mm right lung base posterior pleural base nodule similar to prior CT. The central airways are patent. Upper Abdomen: No acute findings. Atrophic appearance of the upper pole of the left kidney as seen previously. Musculoskeletal: Osteopenia with degenerative changes of the spine. Erosion of the right first rib by the right apical tumor. Faint sclerotic foci in T11, nonspecific. Metastatic disease is not excluded. No acute osseous pathology. Review of the MIP images confirms the above findings. IMPRESSION: 1. No acute pulmonary artery embolus. Nonocclusive linear and bandlike scarring within the pulmonary arteries sequela of prior PEs. 2. Right apical pleural based mass with erosive changes of the right first rib consistent with malignancy. 3. Right hilar adenopathy, likely metastatic disease. 4. Faint sclerotic foci in T11,  nonspecific. Metastatic disease is not excluded. 5.  Aortic Atherosclerosis (ICD10-I70.0). Electronically Signed   By: Anner Crete M.D.   On: 05/11/2022 02:35   DG Chest 2 View  Result Date: 05/10/2022 CLINICAL DATA:  Chest pain EXAM: CHEST - 2 VIEW COMPARISON:  Chest x-ray dated March 21, 2022 FINDINGS: Stable cardiomegaly. Asymmetric right apical opacity which is increased when compared with prior exam. Both lungs are otherwise clear. The visualized skeletal structures are unremarkable. IMPRESSION: 1. Asymmetric right apical opacity concerning for Pancoast tumor. Recommend further evaluation with chest CT. 2. Stable cardiomegaly. Electronically Signed  By: Yetta Glassman M.D.   On: 05/10/2022 20:26   MR SHOULDER RIGHT WO CONTRAST  Result Date: 04/15/2022 CLINICAL DATA:  Right shoulder pain with reduced range of motion over the last 6 weeks. EXAM: MRI OF THE RIGHT SHOULDER WITHOUT CONTRAST TECHNIQUE: Multiplanar, multisequence MR imaging of the shoulder was performed. No intravenous contrast was administered. COMPARISON:  Radiographs 02/04/2022 FINDINGS: Rotator cuff:  Mild supraspinatus and subscapularis tendinopathy. Muscles:  Unremarkable Biceps long head: Longitudinal tearing of the long head of the biceps tendon is visible in the proximal bicipital groove. Acromioclavicular Joint: Deformity of the distal clavicle probably from an old fracture. Notable subacromial spurring along the coracoacromial ligament. There is also substantial ossification below the distal clavicle pseudo articulating with the top of the coracoid, along the expected location of the coracoclavicular ligament. Type III acromion. No significant bursitis. Glenohumeral Joint: Moderate to severe degenerative glenohumeral arthropathy with spurring and loss of articular cartilage. No joint effusion. Mild synovitis along the rotator interval. Labrum:  Grossly intact Bones:  No additional bony findings. Other: No supplemental  non-categorized findings. IMPRESSION: 1. Longitudinal tearing of the long head of the biceps tendon in the proximal bicipital groove. 2. Mild supraspinatus and subscapularis tendinopathy. Unfavorable subacromial morphology. 3. Deformity of the distal clavicle probably from an old fracture. There is also substantial ossification below the distal clavicle along the expected location of the coracoclavicular ligament, pseudo articulating with the coracoid. 4. Moderate to severe degenerative glenohumeral arthropathy. 5. Mild synovitis along the rotator interval. Electronically Signed   By: Van Clines M.D.   On: 04/15/2022 12:17    Microbiology: Results for orders placed or performed during the hospital encounter of 05/11/22  Resp panel by RT-PCR (RSV, Flu A&B, Covid) Anterior Nasal Swab     Status: None   Collection Time: 05/10/22  8:29 PM   Specimen: Anterior Nasal Swab  Result Value Ref Range Status   SARS Coronavirus 2 by RT PCR NEGATIVE NEGATIVE Final    Comment: (NOTE) SARS-CoV-2 target nucleic acids are NOT DETECTED.  The SARS-CoV-2 RNA is generally detectable in upper respiratory specimens during the acute phase of infection. The lowest concentration of SARS-CoV-2 viral copies this assay can detect is 138 copies/mL. A negative result does not preclude SARS-Cov-2 infection and should not be used as the sole basis for treatment or other patient management decisions. A negative result may occur with  improper specimen collection/handling, submission of specimen other than nasopharyngeal swab, presence of viral mutation(s) within the areas targeted by this assay, and inadequate number of viral copies(<138 copies/mL). A negative result must be combined with clinical observations, patient history, and epidemiological information. The expected result is Negative.  Fact Sheet for Patients:  EntrepreneurPulse.com.au  Fact Sheet for Healthcare Providers:   IncredibleEmployment.be  This test is no t yet approved or cleared by the [place] FDA and  has been authorized for detection and/or diagnosis of SARS-CoV-2 by FDA under an Emergency Use Authorization (EUA). This EUA will remain  in effect (meaning this test can be used) for the duration of the COVID-19 declaration under Section 564(b)(1) of the Act, 21 U.S.C.section 360bbb-3(b)(1), unless the authorization is terminated  or revoked sooner.       Influenza A by PCR NEGATIVE NEGATIVE Final   Influenza B by PCR NEGATIVE NEGATIVE Final    Comment: (NOTE) The Xpert Xpress SARS-CoV-2/FLU/RSV plus assay is intended as an aid in the diagnosis of influenza from Nasopharyngeal swab specimens and should not be used as  a sole basis for treatment. Nasal washings and aspirates are unacceptable for Xpert Xpress SARS-CoV-2/FLU/RSV testing.  Fact Sheet for Patients: EntrepreneurPulse.com.au  Fact Sheet for Healthcare Providers: IncredibleEmployment.be  This test is not yet approved or cleared by the [place] FDA and has been authorized for detection and/or diagnosis of SARS-CoV-2 by FDA under an Emergency Use Authorization (EUA). This EUA will remain in effect (meaning this test can be used) for the duration of the COVID-19 declaration under Section 564(b)(1) of the Act, 21 U.S.C. section 360bbb-3(b)(1), unless the authorization is terminated or revoked.     Resp Syncytial Virus by PCR NEGATIVE NEGATIVE Final    Comment: (NOTE) Fact Sheet for Patients: EntrepreneurPulse.com.au  Fact Sheet for Healthcare Providers: IncredibleEmployment.be  This test is not yet approved or cleared by the [place] FDA and has been authorized for detection and/or diagnosis of SARS-CoV-2 by FDA under an Emergency Use Authorization (EUA). This EUA will remain in effect (meaning this test can be used) for  the duration of the COVID-19 declaration under Section 564(b)(1) of the Act, 21 U.S.C. section 360bbb-3(b)(1), unless the authorization is terminated or revoked.  Performed at [facility], [address] [place], [place] [address]     Labs: CBC: Recent Labs  Lab 05/10/22 2029 05/12/22 0243 05/13/22 0443 05/14/22 0453  WBC 5.2 3.9* 3.3* 3.2*  NEUTROABS 3.6  --   --  1.9  HGB 13.2 11.7* 11.5* 11.8*  HCT 44.1 37.8* 37.1* 38.1*  MCV 80.2 79.1* 77.9* 79.0*  PLT 186 149* 152 096*   Basic Metabolic Panel: Recent Labs  Lab 05/10/22 2029 05/12/22 0243 05/14/22 0453  NA 136 137 137  K 4.0 3.9 4.6  CL 100 104 101  CO2 24 25 25   GLUCOSE 103* 116* 99  BUN 30* 37* 40*  CREATININE 1.23 1.14 1.38*  CALCIUM 9.8 9.2 9.3  MG  --   --  2.4  PHOS  --   --  4.5   Liver Function Tests: Recent Labs  Lab 05/10/22 2029 05/14/22 0453  AST 23 18  ALT 20 14  ALKPHOS 68 57  BILITOT 0.8 0.5  PROT 8.2* 6.9  ALBUMIN 4.3 3.4*   CBG: Recent Labs  Lab 05/14/22 1747 05/14/22 1751 05/14/22 1758 05/14/22 1819 05/14/22 2009  GLUCAP 45* 33* 70 117* 80   Discharge time spent: greater than 30 minutes.  Signed: Raiford Noble, DO [facility] 05/14/2022

## 2022-05-14 NOTE — Progress Notes (Signed)
[facility] for IV heparin Indication: pulmonary embolus  No Known Allergies  Patient Measurements: Height: 6\' 2"  (188 cm) Weight: 94.3 kg (208 lb) IBW/kg (Calculated) : 82.2 Heparin Dosing Weight: 94.3 kg  Vital Signs: Temp: 98.1 F (36.7 C) (12/29 0017) Temp Source: Oral (12/29 0017) BP: 99/66 (12/29 0017) Pulse Rate: 63 (12/29 0017)  Labs: Recent Labs    05/12/22 0243 05/12/22 1242 05/13/22 0443 05/13/22 1519 05/14/22 0453  HGB 11.7*  --  11.5*  --  11.8*  HCT 37.8*  --  37.1*  --  38.1*  PLT 149*  --  152  --  149*  APTT 99* 97* 129*  --  112*  HEPARINUNFRC 0.66 0.73* 0.75* 0.61 0.57  CREATININE 1.14  --   --   --  1.38*     Estimated Creatinine Clearance: 55.4 mL/min (A) (by C-G formula based on SCr of 1.38 mg/dL (H)).   Medical History: Past Medical History:  Diagnosis Date   Arthritis    Atrial fibrillation ([place])    Cancer ([place])    Prostate   CHF (congestive heart failure) (HCC)    Diabetes mellitus without complication ([place])    Gout current and history of   History of cocaine abuse ([place])    + UDS on admission   History of deep vein thrombosis 2006   History of tobacco abuse    has smoked for 50 years   Hypertension    Pulmonary embolism ([place]) 2010   Renal infarct ([place]) 03/2019   left renal infarct   Stroke ([place]) 2017   affected speech and right side-uses cane   Varicose veins of both lower extremities    Wears dentures    full upper, partial lower    Medications:   Xarelto 15 mg daily (last fill 04/2022): last dose 05/09/2022 at 1700  Assessment: 71 year old male presenting with severe right shoulder pain with previously planned surgery for 05/14/2022. PMH includes HFrEF (45-50%), hx of DVT/PE, CVA, renal infarct, and tobacco abuse. Starting IV heparin for previous PE ISO upcoming surgery.  CT angiogram of the chest was obtained which identified nonocclusive linear and bandlike scarring within the  pulmonary arteries sequela of prior PEs, a right apical pleural-based mass with erosive changes of the right first rib consistent with malignancy, right hilar adenopathy thought to be likely metastatic disease, and faint sclerotic foci at T11 that was thought to be nonspecific.   INR elevated at 1.1. aPTT 31. Baseline Hgb and Platelets stable.  Goal of Therapy:  aPTT 66-102 seconds Monitor platelets by anticoagulation protocol: Yes   1226 1600 aPTT 64 sec, subtherapeutic @ 1500 units/hr 12/27 0252 aPTT 99, HL 0.66, therapeutic x 1 12/27 1242 aPTT 97; HL 0.73, therapeutic x 2  12/28 0443 aPTT 129, HL 0.75 supratherapeutic -- heparin level correlating 12/28 1500 HL 0.6, therapeutic 12/29 0453 HL 0.57, therapeutic x 2  Plan:  Continue heparin infusion at 1550 units/hr Recheck HL daily while therapeutic on heparin. Continue to monitor H&H and platelets  Renda Rolls, PharmD, [facility] 05/14/2022 6:37 AM

## 2022-05-14 NOTE — Evaluation (Signed)
Physical Therapy Evaluation Patient Details Name: [name] MRN: [id number] DOB: 08-09-1950 Today's Date: 05/14/2022  History of Present Illness  [name] is a 71 y.o. male who presents to the ED from home with a chief complaint of severe right shoulder pain.  Patient has been evaluated by orthopedic surgery who plans for surgery on 05/14/2022.  States he cannot take the pain.  Endorses associated numbness.  Denies fever, cough, chest pain, shortness of breath, abdominal pain, nausea, vomiting or dizziness.   Clinical Impression  Pt admitted with above diagnosis. Pt received upright in bed agreeable to PT. 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 Has a girlfriend that assists him intermittently over the weekends and is available for 24/7 assist if needed.   To date, pt demonstrates adequate R shoulder flexion AROM to complete needed overhead ADL's. Able to utilize RUE equal to LUE with bed mobility, donning/doffing gowns demonstrating adequate R shoulder mobility for dressing. Pt mod-I with bed mobility and STS transfers ambulating > 200' with IV pole in LUE mimicking SPC use. Pt at supervision with gait with minimal step through gait due to wide BOS of IV pole. Reports he is at his baseline level for gait. Pt returning to room sitting in recliner due to soiled bed. Encouraged pt to utilize girlfriend at home if needs increased assist at home as pt does not present with any f/u recs or acute PT needs currently. Pt's biggest concern remains with his shoulder pain and his surgery. Encouraged pt to discuss with attending MD when MD rounds today on updates or next steps. Pt understanding. All needs in reach. PT to sign off.     Recommendations for follow up therapy are one component of a multi-disciplinary discharge planning process, led by the attending physician.   Recommendations may be updated based on patient status, additional functional criteria and insurance authorization.  Follow Up Recommendations No PT follow up      Assistance Recommended at Discharge Intermittent Supervision/Assistance  Patient can return home with the following  Assistance with cooking/housework;A little help with bathing/dressing/bathroom    Equipment Recommendations None recommended by PT  Recommendations for Other Services       Functional Status Assessment Patient has not had a recent decline in their functional status     Precautions / Restrictions Precautions Precautions: Fall Restrictions Weight Bearing Restrictions: No      Mobility  Bed Mobility Overal bed mobility: Modified Independent               Patient Response: Cooperative  Transfers Overall transfer level: Modified independent                 General transfer comment: to IV pole    Ambulation/Gait Ambulation/Gait assistance: Supervision Gait Distance (Feet): 220 Feet Assistive device: IV Pole Gait Pattern/deviations: Step-through pattern, Decreased step length - right, Decreased step length - left       General Gait Details: use of IV pole to mimic cane. COmpletes > 200' minimal step through pattern due to wide IV pole base. Reports this is his baseline ambulation.  Stairs            Wheelchair Mobility    Modified Rankin (Stroke Patients Only)       Balance Overall balance assessment: Needs assistance Sitting-balance support: No upper extremity supported, Feet supported Sitting balance-Leahy Scale: Good     Standing balance  support: No upper extremity supported, During functional activity Standing balance-Leahy Scale: Fair Standing balance comment: maintains standing without AD                             Pertinent Vitals/Pain Pain Assessment Pain Assessment: Faces Faces Pain Scale: Hurts little more Pain Location: R shoulder Pain  Descriptors / Indicators: Discomfort, Grimacing Pain Intervention(s): Monitored during session    Home Living Family/patient expects to be discharged to:: Private residence Living Arrangements: Spouse/significant other Available Help at Discharge: Available PRN/intermittently Type of Home: Apartment Home Access: Level entry       Home Layout: One level Home Equipment: Cane - single point;BSC/3in1      Prior Function Prior Level of Function : Independent/Modified Independent             Mobility Comments: Mod-I with SPC. Still driving. ADLs Comments: Reports having girlfriend that assists ADL's as needed. Able to do his ADL's with R shoulder, is just painful. Adequate AROM appreciated.     Hand Dominance   Dominant Hand: Right    Extremity/Trunk Assessment   Upper Extremity Assessment Upper Extremity Assessment: Generalized weakness;RUE deficits/detail RUE Deficits / Details: R shoulder pain. Presents with adequate forward shoulder flexion to perform overhead ADL's.    Lower Extremity Assessment Lower Extremity Assessment: Overall WFL for tasks assessed    Cervical / Trunk Assessment Cervical / Trunk Assessment: Normal  Communication   Communication: HOH;Other (comment);Expressive difficulties  Cognition Arousal/Alertness: Awake/alert Behavior During Therapy: WFL for tasks assessed/performed Overall Cognitive Status: Within Functional Limits for tasks assessed                                          General Comments      Exercises Other Exercises Other Exercises: Role of PT in acute setting. educated no necessary DME needs or d/c recs.   Assessment/Plan    PT Assessment Patient does not need any further PT services  PT Problem List         PT Treatment Interventions      PT Goals (Current goals can be found in the Care Plan section)  Acute Rehab PT Goals PT Goal Formulation: All assessment and education complete, DC therapy     Frequency       Co-evaluation               AM-PAC PT "6 Clicks" Mobility  Outcome Measure Help needed turning from your back to your side while in a flat bed without using bedrails?: None Help needed moving from lying on your back to sitting on the side of a flat bed without using bedrails?: None Help needed moving to and from a bed to a chair (including a wheelchair)?: A Little Help needed standing up from a chair using your arms (e.g., wheelchair or bedside chair)?: None Help needed to walk in hospital room?: A Little Help needed climbing 3-5 steps with a railing? : A Little 6 Click Score: 21    End of Session Equipment Utilized During Treatment: Gait belt Activity Tolerance: Patient tolerated treatment well Patient left: in chair;with call bell/phone within reach;with chair alarm set Nurse Communication: Mobility status      Time: 5400-8676 PT Time Calculation (min) (ACUTE ONLY): 20 min   Charges:   PT Evaluation $PT Eval Low Complexity: 1 Low  Salem Caster. [place] IV, PT, DPT Physical Therapist- [facility]  05/14/2022, 10:59 AM

## 2022-05-14 NOTE — Care Management Important Message (Signed)
Important Message  Patient Details  Name: [name] MRN: [id number] Date of Birth: [birth year]   Medicare Important Message Given:  Yes     [name] [name] 05/14/2022, 2:43 PM

## 2022-05-14 NOTE — Evaluation (Signed)
Occupational Therapy Evaluation Patient Details Name: [name] MRN: [id number] DOB: 07-Aug-1950 Today's Date: 05/14/2022   History of Present Illness [name] is a 71 y.o. male who presents to the ED from home with a chief complaint of severe right shoulder pain.  Patient has been evaluated by orthopedic surgery who plans for surgery on 05/14/2022.  States he cannot take the pain.  Endorses associated numbness.  Denies fever, cough, chest pain, shortness of breath, abdominal pain, nausea, vomiting or dizziness.   Clinical Impression   Upon entering the room, pt seated on EOB and is agreeable to OT evaluation. Pt reports living with significant other and being Mod I with use of SPC for mobility. Pt reports having occasional assistance at home for self care but demonstrated he can perform independently with increased time. Pt with adequate ROM to complete self care tasks. Pt performing mobility and self care at baseline level of function. Pt does not need additional equipment or education at this time. No skilled OT intervention needed. OT to complete orders.      Recommendations for follow up therapy are one component of a multi-disciplinary discharge planning process, led by the attending physician.  Recommendations may be updated based on patient status, additional functional criteria and insurance authorization.   Follow Up Recommendations  No OT follow up     Assistance Recommended at Discharge None     Functional Status Assessment  Patient has not had a recent decline in their functional status  Equipment Recommendations  None recommended by OT       Precautions / Restrictions Precautions Precautions: Fall Restrictions Weight Bearing Restrictions: No      Mobility Bed Mobility Overal bed mobility: Modified Independent                  Transfers Overall transfer level: Modified independent                        Balance Overall balance  assessment: Needs assistance Sitting-balance support: No upper extremity supported, Feet supported Sitting balance-Leahy Scale: Good     Standing balance support: No upper extremity supported, During functional activity Standing balance-Leahy Scale: Fair                             ADL either performed or assessed with clinical judgement   ADL Overall ADL's : At baseline;Modified independent                                       General ADL Comments: Pt demonstrates donning B socks without assistance and reports feeling at his normal and "just awaiting surgery".He is functionally independent with self care tasks in room.     Vision Patient Visual Report: No change from baseline              Pertinent Vitals/Pain Pain Assessment Pain Assessment: Faces Faces Pain Scale: Hurts a little bit Pain Location: R shoulder Pain Descriptors / Indicators: Discomfort Pain Intervention(s): Monitored during session     Hand Dominance Right   Extremity/Trunk Assessment Upper Extremity Assessment RUE Deficits / Details: R shoulder pain. Presents with adequate forward shoulder flexion to perform overhead ADL's.   Lower Extremity Assessment Lower Extremity Assessment: Overall WFL for tasks assessed       Communication Communication Communication: HOH;Other (comment);Expressive difficulties  Cognition Arousal/Alertness: Awake/alert Behavior During Therapy: WFL for tasks assessed/performed Overall Cognitive Status: Within Functional Limits for tasks assessed                                                  Home Living Family/patient expects to be discharged to:: Private residence Living Arrangements: Spouse/significant other Available Help at Discharge: Available PRN/intermittently Type of Home: Apartment Home Access: Level entry     Home Layout: One level     Bathroom Shower/Tub: Teacher, early years/pre:  Standard Bathroom Accessibility: Yes   Home Equipment: Cane - single point;BSC/3in1          Prior Functioning/Environment Prior Level of Function : Independent/Modified Independent             Mobility Comments: Mod-I with SPC. Still driving. ADLs Comments: Reports having girlfriend that assists ADL's as needed. Able to do his ADL's with R shoulder, is just painful. Adequate AROM appreciated.                 OT Goals(Current goals can be found in the care plan section) Acute Rehab OT Goals Patient Stated Goal: to get my surgery OT Goal Formulation: With patient Time For Goal Achievement: 05/14/22 Potential to Achieve Goals: Good  OT Frequency:         AM-PAC OT "6 Clicks" Daily Activity     Outcome Measure Help from another person eating meals?: None Help from another person taking care of personal grooming?: None Help from another person toileting, which includes using toliet, bedpan, or urinal?: None Help from another person bathing (including washing, rinsing, drying)?: None Help from another person to put on and taking off regular upper body clothing?: None Help from another person to put on and taking off regular lower body clothing?: None 6 Click Score: 24   End of Session Nurse Communication: Mobility status  Activity Tolerance: Patient tolerated treatment well Patient left: in bed;with call bell/phone within reach;with bed alarm set                   Time: 9030-0923 OT Time Calculation (min): 17 min Charges:  OT General Charges $OT Visit: 1 Visit OT Evaluation $OT Eval Low Complexity: 1 Low OT Treatments $Self Care/Home Management : 8-22 mins  Darleen Crocker, MS, OTR/L , CBIS ascom [phone]  05/14/22, 3:21 PM

## 2022-05-14 NOTE — Progress Notes (Signed)
[facility] for Xarelto Indication: Hx of PE/DVT and Afib  No Known Allergies  Patient Measurements: Height: 6\' 2"  (188 cm) Weight: 94.3 kg (208 lb) IBW/kg (Calculated) : 82.2 Heparin Dosing Weight: 94.3 kg  Vital Signs: Temp: 98 F (36.7 C) (12/29 1509) Temp Source: Oral (12/29 1509) BP: 109/77 (12/29 1509) Pulse Rate: 60 (12/29 1509)  Labs: Recent Labs    05/12/22 0243 05/12/22 1242 05/13/22 0443 05/13/22 1519 05/14/22 0453  HGB 11.7*  --  11.5*  --  11.8*  HCT 37.8*  --  37.1*  --  38.1*  PLT 149*  --  152  --  149*  APTT 99* 97* 129*  --  112*  HEPARINUNFRC 0.66 0.73* 0.75* 0.61 0.57  CREATININE 1.14  --   --   --  1.38*     Estimated Creatinine Clearance: 55.4 mL/min (A) (by C-G formula based on SCr of 1.38 mg/dL (H)).   Medical History: Past Medical History:  Diagnosis Date   Arthritis    Atrial fibrillation ([place])    Cancer ([place])    Prostate   CHF (congestive heart failure) (HCC)    Diabetes mellitus without complication ([place])    Gout current and history of   History of cocaine abuse ([place])    + UDS on admission   History of deep vein thrombosis 2006   History of tobacco abuse    has smoked for 50 years   Hypertension    Pulmonary embolism ([place]) 2010   Renal infarct ([place]) 03/2019   left renal infarct   Stroke ([place]) 2017   affected speech and right side-uses cane   Varicose veins of both lower extremities    Wears dentures    full upper, partial lower    Medications:   Xarelto 15 mg daily (last fill 04/2022): last dose 05/09/2022 at 1700  Assessment: 70 year old male presenting with severe right shoulder pain with previously planned surgery for 05/14/2022. PMH includes HFrEF (45-50%), hx of DVT/PE, CVA, renal infarct, and tobacco abuse. Starting IV heparin for previous PE ISO upcoming surgery.  CT angiogram of the chest was obtained which identified nonocclusive linear and bandlike scarring within the  pulmonary arteries sequela of prior PEs, a right apical pleural-based mass with erosive changes of the right first rib consistent with malignancy, right hilar adenopathy thought to be likely metastatic disease, and faint sclerotic foci at T11 that was thought to be nonspecific.   12/29 Pharmacy consulted to resume Xarelto.  Goal of Therapy:  Monitor platelets by anticoagulation protocol: Yes   Plan:  -Stop heparin infusion -Start Xarelto 20 mg po daily (patient's eCrCl > 50 ml/min) -CBC at least every 3 days  Tollie Eth, PharmD Clinical Pharmacist  05/14/2022 4:57 PM

## 2022-05-14 NOTE — Progress Notes (Signed)
Patient d/c'd. Has cab voucher. Cab company does not give rides after 1830. MD notified. Will keep patient on tele overnight. Patient will leave early AM.

## 2022-05-15 LAB — CBC
HCT: 36.2 % — ABNORMAL LOW (ref 39.0–52.0)
Hemoglobin: 11.4 g/dL — ABNORMAL LOW (ref 13.0–17.0)
MCH: 24.4 pg — ABNORMAL LOW (ref 26.0–34.0)
MCHC: 31.5 g/dL (ref 30.0–36.0)
MCV: 77.4 fL — ABNORMAL LOW (ref 80.0–100.0)
Platelets: 134 10*3/uL — ABNORMAL LOW (ref 150–400)
RBC: 4.68 MIL/uL (ref 4.22–5.81)
RDW: 17 % — ABNORMAL HIGH (ref 11.5–15.5)
WBC: 4 10*3/uL (ref 4.0–10.5)
nRBC: 0 % (ref 0.0–0.2)

## 2022-05-15 LAB — GLUCOSE, CAPILLARY: Glucose-Capillary: 77 mg/dL (ref 70–99)

## 2022-05-15 MED ORDER — SENNOSIDES-DOCUSATE SODIUM 8.6-50 MG PO TABS: 1.0000 | ORAL_TABLET | Freq: Every day | ORAL | 0 refills | Status: DC

## 2022-05-15 MED ORDER — BISACODYL 10 MG RE SUPP: 10.0000 mg | Freq: Every day | RECTAL | Status: DC | PRN

## 2022-05-15 MED ORDER — SENNOSIDES-DOCUSATE SODIUM 8.6-50 MG PO TABS
1.0000 | ORAL_TABLET | Freq: Two times a day (BID) | ORAL | Status: DC
  Administered 2022-05-15: 1 via ORAL
  Filled 2022-05-15: qty 1

## 2022-05-15 MED ORDER — POLYETHYLENE GLYCOL 3350 17 G PO PACK
17.0000 g | PACK | Freq: Two times a day (BID) | ORAL | Status: DC
  Administered 2022-05-15: 17 g via ORAL

## 2022-05-15 NOTE — Plan of Care (Signed)
  Problem: Pain Managment: Goal: General experience of comfort will improve Outcome: Progressing   Problem: Safety: Goal: Ability to remain free from injury will improve Outcome: Progressing   

## 2022-05-15 NOTE — Discharge Summary (Addendum)
Physician Discharge Summary   Patient: [name] MRN: [id number] DOB: 1950/10/29  Admit date:     05/11/2022  Discharge date: 05/15/22  Discharge Physician: Raiford Noble   PCP: Theotis Burrow, MD   Recommendations at discharge:   Follow-up with PCP within 1 to 2 weeks and repeat CBC, CMP, mag, Phos within 1 week Follow-up with pulmonary in outpatient setting for outpatient lung nodule biopsy Follow-up with orthopedic surgery Dr. 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 *  Hospital Course: HPI per Dr. Jon Billings [name] is a 71 y.o. 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  He has been evaluated by orthopedic surgery and had recent MRI of the shoulder 11/30 which showed longitudinal tearing of the long head of the biceps tendon in the proximal bicipital groove, mild supraspinatus and subscapularis tendinopathy, deformity of the distal clavicle, likely old fracture, moderate to severe degenerative glenohumeral arthropathy, and mild synovitis along the rotator interval.  Plans were made for surgery on 05/14/2022 with orthopedics.  He reports that at home he was  having severe pain and only receiving 1 to 2 hours of relief with the pain medications as given.  Review of PMDP notes given 14 pills of tramadol 50 mg last on 12/18.  He was having associated symptoms of numbness.  At baseline patient ambulates with use of a cane and lives alone, but reported having difficulty caring for himself due to his symptoms.  He does still smoke approximately 4 cigarettes/day on average.   In the emergency department patient was noted to be afebrile with blood pressure was 127/92 -169/100.  Labs noted hemoglobin 13.2, MCH 24, RDW 17.2, BUN 30, creatinine 1.23, and high-sensitivity troponins negative x 2.  Chest x-ray noted asymmetric right apical opacity concerning for Pancoast tumor and stable cardiomegaly.  CT angiogram of the chest was obtained which identified a right apical pleural-based mass with erosive changes of the right first rib consistent with malignancy, right hilar adenopathy thought to be likely metastatic disease, and faint sclerotic foci at T11 that was thought to be nonspecific.  Patient has been given Zofran, ketorolac, and Dilaudid IV for pain.   **Interim History  Continues to have shoulder pain.  After further evaluation pulmonary feels that he can have an outpatient biopsy of his lung nodule and the interventional radiologist feels that there is no VIR intervention required at this time but they feel that if there is inadequate sampling that they may submit a VIR biopsy request for percutaneous biopsy in the ambulatory setting  I discussed the case with orthopedics and they have nothing else to offer the patient inpatient.  PT OT evaluated and recommended no follow-up.  He is deemed medically stable given  that his pain was well-controlled with his current regimen.  He will have an outpatient biopsy for concern for Pancoast tumor as well as outpatient evaluation with Dr. Roland Rack for consideration of Shoulder repair.  He is medically stable to be discharged at  this time.  ADDENDUM 05/15/22: He was stable to D/C yesterday but did not leave due to inability to get the [employer] last evening. He remained medically stable and was not examined today as he left the hospital prior to my evaluation. He stated to the nursing staff he had not had a bowel movement since PTA and he was resumed on a bowel regimen. No acute issues overnight. He will need to follow up with PCP, Orthopedic Surgery, and Pulmonary in the outpatient setting.   Assessment and Plan:  Right shoulder pain  Biceps tendon tear -Acute on chronic.   -Patient has been having right shoulder pain since back in September, but recently noted to be more severe.   -Evaluated and has been seen by orthopedics with recent MRI showing biceps tendon tear abnormalities for which patient was scheduled to have surgery with orthopedics on 12/29.  Question if patient's symptoms -Admit to MedSurg bed -Oxycodone/Dilaudid IV as needed for moderate to severe pain -Continue gabapentin and currently he is getting scheduled Toradol IV every 8 hours for 5 doses -Consult Orthopedic Surgery to see if there is anything else that can be done besides pain control; he is supposed to follow-up with Dr. Roland Rack after discharge and received steroid injection in the shoulder on 04/06/2022 and orthopedic surgery team reported that they were unable to pursue operative intervention until 3 months after steroid injections at least and I discussed the case with orthopedic surgery team and they have no further inpatient recommendations -Will obtain PT and OT evaluation prior to safe discharge disposition they recommended no home health   Suspected Pancoast tumor -Acute.   -Chest x-ray gave concern for right apical opacity.   -CT angiogram of the chest noted right apical pleural-based mass with erosive changes of the right first rib consistent with malignancy, right hilar adenopathy thought to be likely metastatic disease, and faint  sclerotic foci at T11 that was thought to be nonspecific. -Initially consulted IR for possible biopsy, but was advised to consult PCCM for possible endobronchial biopsy -Pulmonary recommends outpatient bronchoscopy and tissue sampling -Patient will need to be set up with hematology oncology eventually -He continues to have a slightly low WBC as well as borderline thrombocytopenia that may or may not be related to the malignancy process -Patient to follow-up with pulmonology in outpatient setting for bronchoscopy and tissue sampling   Chronic atrial fibrillation on chronic anticoagulation -Patient appears to be in atrial fibrillation  and currently rate controlled. -Continue metoprolol   -Hold Xarelto in case he was to get an EBUS but this looks like this could be done in outpatient setting as the pulmonary note indicates that [facility] to perform biopsy of lung nodule on outpatient basis and he has been placed on a heparin drip as below -Heparin per pharmacy now being transition back to Xarelto   History of DVT/PE -Patient with prior history of DVT back in 2018 on the right leg, and  CTA of the chest from 03/21/2022 noted concern for scattered tiny pulmonary emboli are favored to be sequela of prior pulmonary embolism. -Hold Xarelto initially but given that he does not need any acute surgical intervention not going under biopsy can be transition back to oral -Heparin per pharmacy  for possible need of procedure   Controlled diabetes mellitus type 2, without long-term use of insulin -On admission glucose 103.  Glucose last hemoglobin A1c 6.6 on 08/09/2021.  Home medications include Jardiance 10 mg daily. -Hypoglycemic protocol -Continue Jardiance -CBGs before every meal with sensitive SSI -Continue to monitor and discontinue sliding scale -CBGs ranging from 68-132   Heart failure with reduced EF -Chronic patient appears relatively euvolemic at this time without significant lower extremity edema or  signs of JVD. Last EF noted to be 45 to 50% with determinate diastolic parameters 0/9323. -Strict I&Os and daily weights   Essential hypertension -Blood pressures currently maintained 97/77-169/100. -Continue current blood pressure regimen of furosemide, losartan, and metoprolol.   History of CVA with residual deficit -Patient with history of prior stroke with residual right-sided weakness and expressive aphasia -Continue aspirin and statin   Iron deficiency Chronic.  Labs significant for hemoglobin 13.3 with MCH low at 24 with elevated RDW.  Patient with prior history of iron deficiency in the distant past. -See Below -Continue to monitor blood count   Hyperlipidemia -Continue simvastatin  Mild renal insufficiency -BUN/Cr Trend: Recent Labs  Lab 05/10/22 2029 05/12/22 0243 05/14/22 0453  BUN 30* 37* 40*  CREATININE 1.23 1.14 1.38*   -Avoid Nephrotoxic Medications, Contrast Dyes, Hypotension and Dehydration to Ensure Adequate Renal Perfusion and will need to Renally Adjust Meds -Continue to Monitor and Trend Renal Function carefully and repeat CMP in the outpatient setting  Hypoalbuminemia -Patient's Albumin is now gone from 4.3 -> 3.4 -Continue to Monitor and Trend -Repeat CMP in the AM   Thrombocytopenia -Patient's Plt Count Trend: Recent Labs  Lab 05/10/22 2029 05/12/22 0243 05/13/22 0443 05/14/22 0453 05/15/22 0349  PLT 186 149* 152 149* 134*   -Continue to Monitor and Trend and Repeat CBC within 1 week   Microcytic Anemia -Patient's Hgb/Hct Trend: Recent Labs  Lab 05/10/22 2029 05/12/22 0243 05/13/22 0443 05/14/22 0453 05/15/22 0349  HGB 13.2 11.7* 11.5* 11.8* 11.4*  HCT 44.1 37.8* 37.1* 38.1* 36.2*   -Anemia panel done and showed an iron level of 64, UIBC of 265, TIBC of 329, saturation ratios of 20%, ferritin level 267 -Continue to monitor for signs and symptoms bleeding; no overt bleeding noted Repeat CBC in a.m.    Pain control - [employer] Controlled Substance Reporting System database was reviewed. and patient was instructed, not to drive, operate heavy machinery, perform activities at heights, swimming or participation in water activities or provide baby-sitting services while on Pain, Sleep and Anxiety Medications; until their outpatient Physician has advised to do so again. Also recommended to not to take more than prescribed Pain, Sleep and Anxiety Medications.   Consultants: Pulmonary and interventional radiology; discussed the case with orthopedic surgery Procedures performed: As above Disposition: Home Diet recommendation:  Discharge Diet Orders (From admission, onward)     Start     Ordered   05/14/22 0000  Diet - low sodium heart healthy        05/14/22 1658   05/14/22 0000  Diet Carb Modified        05/14/22 1658           Cardiac and Carb modified diet DISCHARGE MEDICATION: Allergies as of 05/15/2022   No Known Allergies      Medication List     STOP taking these medications    oxyCODONE-acetaminophen 5-325 MG tablet Commonly known as: Percocet   traMADol 50 MG tablet Commonly known as: Veatrice Bourbon  TAKE these medications    acetaminophen 325 MG tablet Commonly known as: TYLENOL Take 1-2 tablets (325-650 mg total) by mouth every 4 (four) hours as needed for mild pain.   albuterol 108 (90 Base) MCG/ACT inhaler Commonly known as: VENTOLIN HFA Inhale into the lungs every 6 (six) hours as needed for wheezing or shortness of breath.   allopurinol 100 MG tablet Commonly known as: ZYLOPRIM Take 100 mg by mouth daily.   aspirin EC 81 MG tablet Take 1 tablet (81 mg total) by mouth daily.   colchicine 0.6 MG tablet Take 1 tablet (0.6 mg total) by mouth daily. Stop taking when your symptoms resolve.   diclofenac Sodium 1 % Gel Commonly known as: VOLTAREN Apply topically.   FeroSul 325 (65 FE) MG tablet Generic drug: ferrous sulfate Take 0.5 tablets by mouth daily with  breakfast.   furosemide 40 MG tablet Commonly known as: LASIX Take 40 mg by mouth 2 (two) times daily.   gabapentin 400 MG capsule Commonly known as: NEURONTIN Take 400 mg by mouth 3 (three) times daily.   Jardiance 10 MG Tabs tablet Generic drug: empagliflozin Take 1 tablet (10 mg total) by mouth daily.   latanoprost 0.005 % ophthalmic solution Commonly known as: XALATAN Place 1 drop into both eyes at bedtime.   lidocaine 5 % Commonly known as: Lidoderm Place 1 patch onto the skin every 12 (twelve) hours. Remove & Discard patch within 12 hours or as directed by MD   losartan 50 MG tablet Commonly known as: COZAAR Take 50 mg by mouth daily.   metoprolol succinate 25 MG 24 hr tablet Commonly known as: TOPROL-XL Take 12.5 mg by mouth daily.   MiraLax 17 GM/SCOOP powder Generic drug: polyethylene glycol powder Take 17 g by mouth daily.   morphine 15 MG 12 hr tablet Commonly known as: MS CONTIN Take 1 tablet (15 mg total) by mouth every 12 (twelve) hours.   ondansetron 4 MG tablet Commonly known as: ZOFRAN Take 1 tablet (4 mg total) by mouth every 6 (six) hours as needed for nausea.   oxyCODONE 5 MG immediate release tablet Commonly known as: Oxy IR/ROXICODONE Take 1 tablet (5 mg total) by mouth every 6 (six) hours as needed for breakthrough pain (MS contin).   potassium chloride SA 20 MEQ tablet Commonly known as: KLOR-CON M Take 1 tablet (20 mEq total) by mouth 2 (two) times daily. What changed: when to take this   rivaroxaban 20 MG Tabs tablet Commonly known as: XARELTO Take 1 tablet (20 mg total) by mouth daily with supper. What changed:  medication strength how much to take when to take this   senna-docusate 8.6-50 MG tablet Commonly known as: Senokot-S Take 1 tablet by mouth at bedtime.   sildenafil 20 MG tablet Commonly known as: REVATIO Take 3-5 tablets 1 hour prior to intercourse as needed   simvastatin 20 MG tablet Commonly known as:  ZOCOR Take 20 mg by mouth at bedtime.        Follow-up Information     Revelo, Elyse Jarvis, MD. Call.   Specialty: Family Medicine Why: Follow up within 1-2 weeks Contact information: [address] [place] [address] [phone]         Ottie Glazier, MD Follow up.   Specialty: Pulmonary Disease Why: Follow up for Outpaitent Lung Biopsy Contact information: [place] [address] [phone]         Corky Mull, MD. Call.  Specialty: Orthopedic Surgery Why: Follow up in the outpatient setting for Shoulder Surgery and Hospital Follow up Contact information: [place] [address] [phone]                Discharge Exam: Danley Danker Weights   05/10/22 1827 05/15/22 0500  Weight: 94.3 kg 94.9 kg   Vitals:   05/15/22 0035 05/15/22 0731  BP:  101/64  Pulse: 74 67  Resp:  17  Temp:  98.6 F (37 C)  SpO2: 97% 100%   Examination: Physical Exam:  Constitutional: WN/WD slightly overweight African-American male in no acute distress Respiratory: Diminished to auscultation bilaterally, no wheezing, rales, rhonchi or crackles. Normal respiratory effort and patient is not tachypenic. No accessory muscle use.  Unlabored breathing Cardiovascular: RRR, no murmurs / rubs / gallops. S1 and S2 auscultated. Abdomen: Soft, non-tender, non-distended. No masses palpated.  GU: Deferred. Musculoskeletal: No clubbing / cyanosis of digits/nails. No joint deformity upper and lower extremities but has some right shoulder also range of motion and some hemiplegia Skin: No rashes, lesions, ulcers limited skin evaluation. No induration; Warm and dry.  Neurologic: CN 2-12 grossly intact with no focal deficits.  Romberg sign and cerebellar reflexes not assessed.  Psychiatric: Normal judgment and insight. Alert and oriented x 3. Normal mood and appropriate affect.   Condition at discharge:  stable  The results of significant diagnostics from this hospitalization (including imaging, microbiology, ancillary and laboratory) are listed below for reference.   Imaging Studies: CT Angio Chest PE W and/or Wo Contrast  Result Date: 05/11/2022 CLINICAL DATA:  Concern for pulmonary embolism. EXAM: CT ANGIOGRAPHY CHEST WITH CONTRAST TECHNIQUE: Multidetector CT imaging of the chest was performed using the standard protocol during bolus administration of intravenous contrast. Multiplanar CT image reconstructions and MIPs were obtained to evaluate the vascular anatomy. RADIATION DOSE REDUCTION: This exam was performed according to the departmental dose-optimization program which includes automated exposure control, adjustment of the mA and/or kV according to patient size and/or use of iterative reconstruction technique. CONTRAST:  8mL OMNIPAQUE IOHEXOL 350 MG/ML SOLN COMPARISON:  Chest CT dated 03/21/2022. FINDINGS: Cardiovascular: There is mild cardiomegaly with biatrial dilatation. No pericardial effusion. Three-vessel coronary vascular calcification. Mild atherosclerotic calcification of the thoracic aorta. No aneurysmal dilatation. Nonocclusive linear and bandlike scarring within the pulmonary arteries sequela of prior PEs. No acute pulmonary artery embolus. No CT evidence of right heart straining. Mediastinum/Nodes: Right hilar adenopathy measures 2.5 cm in short axis similar to prior CT. No mediastinal adenopathy. The esophagus is grossly unremarkable. No mediastinal fluid collection. Lungs/Pleura: There is a 3.2 x 5.1 cm right apical pleural based mass with erosive changes of the first rib consistent with malignancy. No pleural effusion or pneumothorax. A 9 mm right lung base posterior pleural base nodule similar to prior CT. The central airways are patent. Upper Abdomen: No acute findings. Atrophic appearance of the upper pole of the left kidney as seen previously. Musculoskeletal: Osteopenia with  degenerative changes of the spine. Erosion of the right first rib by the right apical tumor. Faint sclerotic foci in T11, nonspecific. Metastatic disease is not excluded. No acute osseous pathology. Review of the MIP images confirms the above findings. IMPRESSION: 1. No acute pulmonary artery embolus. Nonocclusive linear and bandlike scarring within the pulmonary arteries sequela of prior PEs. 2. Right apical pleural based mass with erosive changes of the right first rib consistent with malignancy. 3. Right hilar adenopathy, likely metastatic disease. 4. Faint sclerotic foci  in T11, nonspecific. Metastatic disease is not excluded. 5.  Aortic Atherosclerosis (ICD10-I70.0). Electronically Signed   By: Anner Crete M.D.   On: 05/11/2022 02:35   DG Chest 2 View  Result Date: 05/10/2022 CLINICAL DATA:  Chest pain EXAM: CHEST - 2 VIEW COMPARISON:  Chest x-ray dated March 21, 2022 FINDINGS: Stable cardiomegaly. Asymmetric right apical opacity which is increased when compared with prior exam. Both lungs are otherwise clear. The visualized skeletal structures are unremarkable. IMPRESSION: 1. Asymmetric right apical opacity concerning for Pancoast tumor. Recommend further evaluation with chest CT. 2. Stable cardiomegaly. Electronically Signed   By: Yetta Glassman M.D.   On: 05/10/2022 20:26    Microbiology: Results for orders placed or performed during the hospital encounter of 05/11/22  Resp panel by RT-PCR (RSV, Flu A&B, Covid) Anterior Nasal Swab     Status: None   Collection Time: 05/10/22  8:29 PM   Specimen: Anterior Nasal Swab  Result Value Ref Range Status   SARS Coronavirus 2 by RT PCR NEGATIVE NEGATIVE Final    Comment: (NOTE) SARS-CoV-2 target nucleic acids are NOT DETECTED.  The SARS-CoV-2 RNA is generally detectable in upper respiratory specimens during the acute phase of infection. The lowest concentration of SARS-CoV-2 viral copies this assay can detect is 138 copies/mL. A negative  result does not preclude SARS-Cov-2 infection and should not be used as the sole basis for treatment or other patient management decisions. A negative result may occur with  improper specimen collection/handling, submission of specimen other than nasopharyngeal swab, presence of viral mutation(s) within the areas targeted by this assay, and inadequate number of viral copies(<138 copies/mL). A negative result must be combined with clinical observations, patient history, and epidemiological information. The expected result is Negative.  Fact Sheet for Patients:  EntrepreneurPulse.com.au  Fact Sheet for Healthcare Providers:  IncredibleEmployment.be  This test is no t yet approved or cleared by the [place] FDA and  has been authorized for detection and/or diagnosis of SARS-CoV-2 by FDA under an Emergency Use Authorization (EUA). This EUA will remain  in effect (meaning this test can be used) for the duration of the COVID-19 declaration under Section 564(b)(1) of the Act, 21 U.S.C.section 360bbb-3(b)(1), unless the authorization is terminated  or revoked sooner.       Influenza A by PCR NEGATIVE NEGATIVE Final   Influenza B by PCR NEGATIVE NEGATIVE Final    Comment: (NOTE) The Xpert Xpress SARS-CoV-2/FLU/RSV plus assay is intended as an aid in the diagnosis of influenza from Nasopharyngeal swab specimens and should not be used as a sole basis for treatment. Nasal washings and aspirates are unacceptable for Xpert Xpress SARS-CoV-2/FLU/RSV testing.  Fact Sheet for Patients: EntrepreneurPulse.com.au  Fact Sheet for Healthcare Providers: IncredibleEmployment.be  This test is not yet approved or cleared by the [place] FDA and has been authorized for detection and/or diagnosis of SARS-CoV-2 by FDA under an Emergency Use Authorization (EUA). This EUA will remain in effect (meaning this test can be used)  for the duration of the COVID-19 declaration under Section 564(b)(1) of the Act, 21 U.S.C. section 360bbb-3(b)(1), unless the authorization is terminated or revoked.     Resp Syncytial Virus by PCR NEGATIVE NEGATIVE Final    Comment: (NOTE) Fact Sheet for Patients: EntrepreneurPulse.com.au  Fact Sheet for Healthcare Providers: IncredibleEmployment.be  This test is not yet approved or cleared by the [place] FDA and has been authorized for detection and/or diagnosis of SARS-CoV-2 by FDA under an Emergency Use Authorization (EUA).  This EUA will remain in effect (meaning this test can be used) for the duration of the COVID-19 declaration under Section 564(b)(1) of the Act, 21 U.S.C. section 360bbb-3(b)(1), unless the authorization is terminated or revoked.  Performed at [facility], [address] [place], [place] [address]     Labs: CBC: Recent Labs  Lab 05/10/22 2029 05/12/22 0243 05/13/22 0443 05/14/22 0453 05/15/22 0349  WBC 5.2 3.9* 3.3* 3.2* 4.0  NEUTROABS 3.6  --   --  1.9  --   HGB 13.2 11.7* 11.5* 11.8* 11.4*  HCT 44.1 37.8* 37.1* 38.1* 36.2*  MCV 80.2 79.1* 77.9* 79.0* 77.4*  PLT 186 149* 152 149* 134*    Basic Metabolic Panel: Recent Labs  Lab 05/10/22 2029 05/12/22 0243 05/14/22 0453  NA 136 137 137  K 4.0 3.9 4.6  CL 100 104 101  CO2 24 25 25   GLUCOSE 103* 116* 99  BUN 30* 37* 40*  CREATININE 1.23 1.14 1.38*  CALCIUM 9.8 9.2 9.3  MG  --   --  2.4  PHOS  --   --  4.5    Liver Function Tests: Recent Labs  Lab 05/10/22 2029 05/14/22 0453  AST 23 18  ALT 20 14  ALKPHOS 68 57  BILITOT 0.8 0.5  PROT 8.2* 6.9  ALBUMIN 4.3 3.4*    CBG: Recent Labs  Lab 05/14/22 1751 05/14/22 1758 05/14/22 1819 05/14/22 2009 05/15/22 0857  GLUCAP 33* 70 117* 80 77    Discharge time spent: greater than 30 minutes.  Signed: Raiford Noble, DO [facility] 05/15/2022

## 2022-06-09 ENCOUNTER — Institutional Professional Consult (permissible substitution): Payer: Medicare Other | Admitting: Pulmonary Disease

## 2022-06-09 ENCOUNTER — Telehealth: Payer: Self-pay

## 2022-06-09 NOTE — Telephone Encounter (Signed)
Patient no showed 06/09/2022 appt.  Pt seen Dr. Lanney Gins during admission and should f/u with him.  ATC patient to f/u. No answer with no option to leave vm due to mailbox not being setup.  Will call back.

## 2022-06-10 NOTE — Telephone Encounter (Signed)
Spoke to patient and relayed below message. He stated that he has an appointment with Dr. Lanney Gins on 06/25/2022. Nothing further needed.

## 2022-06-11 ENCOUNTER — Observation Stay
Admission: EM | Admit: 2022-06-11 | Discharge: 2022-06-12 | Disposition: A | Discharge status: Home / Self Care | Payer: Medicare Other | Attending: Internal Medicine | Admitting: Internal Medicine

## 2022-06-11 ENCOUNTER — Emergency Department: Payer: Medicare Other

## 2022-06-11 ENCOUNTER — Other Ambulatory Visit: Payer: Self-pay

## 2022-06-11 DIAGNOSIS — R4182 Altered mental status, unspecified: Secondary | ICD-10-CM | POA: Diagnosis not present

## 2022-06-11 DIAGNOSIS — M25511 Pain in right shoulder: Secondary | ICD-10-CM | POA: Diagnosis not present

## 2022-06-11 DIAGNOSIS — Z86718 Personal history of other venous thrombosis and embolism: Secondary | ICD-10-CM | POA: Insufficient documentation

## 2022-06-11 DIAGNOSIS — I110 Hypertensive heart disease with heart failure: Secondary | ICD-10-CM | POA: Diagnosis not present

## 2022-06-11 DIAGNOSIS — C3411 Malignant neoplasm of upper lobe, right bronchus or lung: Secondary | ICD-10-CM | POA: Diagnosis not present

## 2022-06-11 DIAGNOSIS — E785 Hyperlipidemia, unspecified: Secondary | ICD-10-CM | POA: Insufficient documentation

## 2022-06-11 DIAGNOSIS — E119 Type 2 diabetes mellitus without complications: Secondary | ICD-10-CM | POA: Insufficient documentation

## 2022-06-11 DIAGNOSIS — I509 Heart failure, unspecified: Secondary | ICD-10-CM | POA: Diagnosis not present

## 2022-06-11 DIAGNOSIS — E871 Hypo-osmolality and hyponatremia: Secondary | ICD-10-CM | POA: Diagnosis not present

## 2022-06-11 DIAGNOSIS — N179 Acute kidney failure, unspecified: Secondary | ICD-10-CM | POA: Diagnosis present

## 2022-06-11 DIAGNOSIS — I482 Chronic atrial fibrillation, unspecified: Secondary | ICD-10-CM | POA: Diagnosis present

## 2022-06-11 DIAGNOSIS — Z79899 Other long term (current) drug therapy: Secondary | ICD-10-CM | POA: Insufficient documentation

## 2022-06-11 DIAGNOSIS — Z86711 Personal history of pulmonary embolism: Secondary | ICD-10-CM | POA: Insufficient documentation

## 2022-06-11 DIAGNOSIS — E1142 Type 2 diabetes mellitus with diabetic polyneuropathy: Secondary | ICD-10-CM | POA: Diagnosis present

## 2022-06-11 DIAGNOSIS — E1140 Type 2 diabetes mellitus with diabetic neuropathy, unspecified: Secondary | ICD-10-CM | POA: Diagnosis not present

## 2022-06-11 DIAGNOSIS — I4891 Unspecified atrial fibrillation: Secondary | ICD-10-CM | POA: Insufficient documentation

## 2022-06-11 DIAGNOSIS — Z7982 Long term (current) use of aspirin: Secondary | ICD-10-CM | POA: Diagnosis not present

## 2022-06-11 DIAGNOSIS — Z8546 Personal history of malignant neoplasm of prostate: Secondary | ICD-10-CM | POA: Insufficient documentation

## 2022-06-11 DIAGNOSIS — Z85118 Personal history of other malignant neoplasm of bronchus and lung: Secondary | ICD-10-CM | POA: Diagnosis not present

## 2022-06-11 DIAGNOSIS — Z8673 Personal history of transient ischemic attack (TIA), and cerebral infarction without residual deficits: Secondary | ICD-10-CM | POA: Insufficient documentation

## 2022-06-11 DIAGNOSIS — Z794 Long term (current) use of insulin: Secondary | ICD-10-CM | POA: Diagnosis not present

## 2022-06-11 DIAGNOSIS — F17210 Nicotine dependence, cigarettes, uncomplicated: Secondary | ICD-10-CM | POA: Diagnosis not present

## 2022-06-11 LAB — BASIC METABOLIC PANEL
Anion gap: 17 — ABNORMAL HIGH (ref 5–15)
BUN: 40 mg/dL — ABNORMAL HIGH (ref 8–23)
CO2: 22 mmol/L (ref 22–32)
Calcium: 10.9 mg/dL — ABNORMAL HIGH (ref 8.9–10.3)
Chloride: 95 mmol/L — ABNORMAL LOW (ref 98–111)
Creatinine, Ser: 1.54 mg/dL — ABNORMAL HIGH (ref 0.61–1.24)
GFR, Estimated: 47 mL/min — ABNORMAL LOW (ref >60)
Glucose, Bld: 128 mg/dL — ABNORMAL HIGH (ref 70–99)
Potassium: 4.1 mmol/L (ref 3.5–5.1)
Sodium: 134 mmol/L — ABNORMAL LOW (ref 135–145)

## 2022-06-11 LAB — CBC
HCT: 37.8 % — ABNORMAL LOW (ref 39.0–52.0)
Hemoglobin: 11.5 g/dL — ABNORMAL LOW (ref 13.0–17.0)
MCH: 24.3 pg — ABNORMAL LOW (ref 26.0–34.0)
MCHC: 30.4 g/dL (ref 30.0–36.0)
MCV: 79.9 fL — ABNORMAL LOW (ref 80.0–100.0)
Platelets: 190 10*3/uL (ref 150–400)
RBC: 4.73 MIL/uL (ref 4.22–5.81)
RDW: 16.1 % — ABNORMAL HIGH (ref 11.5–15.5)
WBC: 5.1 10*3/uL (ref 4.0–10.5)
nRBC: 0 % (ref 0.0–0.2)

## 2022-06-11 LAB — TROPONIN I (HIGH SENSITIVITY)
Troponin I (High Sensitivity): 15 ng/L (ref <18)
Troponin I (High Sensitivity): 16 ng/L (ref <18)

## 2022-06-11 LAB — LACTIC ACID, PLASMA
Lactic Acid, Venous: 1.5 mmol/L (ref 0.5–1.9)
Lactic Acid, Venous: 1.5 mmol/L (ref 0.5–1.9)

## 2022-06-11 LAB — GLUCOSE, CAPILLARY: Glucose-Capillary: 128 mg/dL — ABNORMAL HIGH (ref 70–99)

## 2022-06-11 MED ORDER — POTASSIUM CHLORIDE CRYS ER 20 MEQ PO TBCR
20.0000 meq | EXTENDED_RELEASE_TABLET | Freq: Every day | ORAL | Status: DC
  Administered 2022-06-11 – 2022-06-12 (×2): 20 meq via ORAL
  Filled 2022-06-11 (×2): qty 1

## 2022-06-11 MED ORDER — SIMVASTATIN 20 MG PO TABS
20.0000 mg | ORAL_TABLET | Freq: Every day | ORAL | Status: DC
  Administered 2022-06-11: 20 mg via ORAL
  Filled 2022-06-11: qty 1

## 2022-06-11 MED ORDER — ASPIRIN 81 MG PO TBEC
81.0000 mg | DELAYED_RELEASE_TABLET | Freq: Every day | ORAL | Status: DC
  Administered 2022-06-12: 81 mg via ORAL
  Filled 2022-06-11: qty 1

## 2022-06-11 MED ORDER — ONDANSETRON HCL 4 MG PO TABS: 4.0000 mg | ORAL_TABLET | Freq: Four times a day (QID) | ORAL | Status: DC | PRN

## 2022-06-11 MED ORDER — MAGNESIUM HYDROXIDE 400 MG/5ML PO SUSP: 30.0000 mL | Freq: Every day | ORAL | Status: DC | PRN

## 2022-06-11 MED ORDER — ALLOPURINOL 100 MG PO TABS
100.0000 mg | ORAL_TABLET | Freq: Every day | ORAL | Status: DC
  Administered 2022-06-12: 100 mg via ORAL
  Filled 2022-06-11: qty 1

## 2022-06-11 MED ORDER — TRAZODONE HCL 50 MG PO TABS: 25.0000 mg | ORAL_TABLET | Freq: Every evening | ORAL | Status: DC | PRN

## 2022-06-11 MED ORDER — ACETAMINOPHEN 325 MG PO TABS: 650.0000 mg | ORAL_TABLET | Freq: Four times a day (QID) | ORAL | Status: DC | PRN

## 2022-06-11 MED ORDER — ACETAMINOPHEN 650 MG RE SUPP: 650.0000 mg | Freq: Four times a day (QID) | RECTAL | Status: DC | PRN

## 2022-06-11 MED ORDER — SODIUM CHLORIDE 0.9 % IV SOLN: Freq: Once | INTRAVENOUS | Status: AC

## 2022-06-11 MED ORDER — COLCHICINE 0.6 MG PO TABS: 0.6000 mg | ORAL_TABLET | Freq: Every day | ORAL | Status: DC | PRN

## 2022-06-11 MED ORDER — ONDANSETRON HCL 4 MG/2ML IJ SOLN
4.0000 mg | Freq: Once | INTRAMUSCULAR | Status: AC
  Administered 2022-06-11: 4 mg via INTRAVENOUS
  Filled 2022-06-11: qty 2

## 2022-06-11 MED ORDER — EMPAGLIFLOZIN 10 MG PO TABS: 10.0000 mg | ORAL_TABLET | Freq: Every day | ORAL | Status: DC

## 2022-06-11 MED ORDER — ALBUTEROL SULFATE (2.5 MG/3ML) 0.083% IN NEBU: 3.0000 mL | INHALATION_SOLUTION | Freq: Four times a day (QID) | RESPIRATORY_TRACT | Status: DC | PRN

## 2022-06-11 MED ORDER — SENNOSIDES-DOCUSATE SODIUM 8.6-50 MG PO TABS
1.0000 | ORAL_TABLET | Freq: Every day | ORAL | Status: DC
  Administered 2022-06-11: 1 via ORAL
  Filled 2022-06-11: qty 1

## 2022-06-11 MED ORDER — MORPHINE SULFATE (PF) 4 MG/ML IV SOLN
4.0000 mg | INTRAVENOUS | Status: DC | PRN
  Administered 2022-06-11: 4 mg via INTRAVENOUS
  Filled 2022-06-11: qty 1

## 2022-06-11 MED ORDER — LACTULOSE 10 GM/15ML PO SOLN
30.0000 g | Freq: Once | ORAL | Status: AC
  Administered 2022-06-11: 30 g via ORAL
  Filled 2022-06-11: qty 60

## 2022-06-11 MED ORDER — INSULIN ASPART 100 UNIT/ML IJ SOLN: 0.0000 [IU] | Freq: Three times a day (TID) | INTRAMUSCULAR | Status: DC

## 2022-06-11 MED ORDER — POLYETHYLENE GLYCOL 3350 17 G PO PACK
17.0000 g | PACK | Freq: Every day | ORAL | Status: DC
  Administered 2022-06-11 – 2022-06-12 (×2): 17 g via ORAL
  Filled 2022-06-11 (×2): qty 1

## 2022-06-11 MED ORDER — GABAPENTIN 400 MG PO CAPS
400.0000 mg | ORAL_CAPSULE | Freq: Every day | ORAL | Status: DC
  Administered 2022-06-12: 400 mg via ORAL
  Filled 2022-06-11: qty 1

## 2022-06-11 MED ORDER — ONDANSETRON HCL 4 MG/2ML IJ SOLN: 4.0000 mg | Freq: Four times a day (QID) | INTRAMUSCULAR | Status: DC | PRN

## 2022-06-11 MED ORDER — METOPROLOL SUCCINATE ER 25 MG PO TB24
12.5000 mg | ORAL_TABLET | Freq: Every day | ORAL | Status: DC
  Administered 2022-06-12: 12.5 mg via ORAL
  Filled 2022-06-11: qty 1

## 2022-06-11 MED ORDER — SODIUM CHLORIDE 0.9 % IV SOLN: INTRAVENOUS | Status: DC

## 2022-06-11 MED ORDER — RIVAROXABAN 20 MG PO TABS
20.0000 mg | ORAL_TABLET | Freq: Every day | ORAL | Status: DC
  Filled 2022-06-11: qty 1

## 2022-06-11 MED ORDER — MORPHINE SULFATE ER 15 MG PO TBCR
15.0000 mg | EXTENDED_RELEASE_TABLET | Freq: Two times a day (BID) | ORAL | Status: DC
  Administered 2022-06-11 – 2022-06-12 (×2): 15 mg via ORAL
  Filled 2022-06-11 (×2): qty 1

## 2022-06-11 MED ORDER — OXYCODONE HCL 5 MG PO TABS: 5.0000 mg | ORAL_TABLET | Freq: Four times a day (QID) | ORAL | Status: DC | PRN

## 2022-06-11 MED ORDER — SODIUM CHLORIDE 0.9 % IV BOLUS
500.0000 mL | Freq: Once | INTRAVENOUS | Status: AC
  Administered 2022-06-11: 500 mL via INTRAVENOUS

## 2022-06-11 MED ORDER — LATANOPROST 0.005 % OP SOLN
1.0000 [drp] | Freq: Every day | OPHTHALMIC | Status: DC
  Administered 2022-06-11: 1 [drp] via OPHTHALMIC
  Filled 2022-06-11: qty 2.5

## 2022-06-11 MED ORDER — FERROUS SULFATE 325 (65 FE) MG PO TABS
325.0000 mg | ORAL_TABLET | Freq: Every day | ORAL | Status: DC
  Administered 2022-06-12: 325 mg via ORAL
  Filled 2022-06-11: qty 1

## 2022-06-11 NOTE — Assessment & Plan Note (Signed)
-  Per his last orthopedic consult it was of unclear etiology but was possibly attributed to degenerative changes primarily in the subacromial space with evidence of significant impingement.  He had no improvement with ultrasound-guided subacromial injection.  He received intra-articular injection of the glenohumeral joint on 04/06/2022. -Pain management will be provided while he is here. - Further orthopedic follow-up can be obtained on an outpatient basis.

## 2022-06-11 NOTE — ED Notes (Signed)
Pt to CT

## 2022-06-11 NOTE — ED Notes (Signed)
Pt asked that this RN call his girlfriend and ask that she come to the ER. This RN called the number provided and left a message on [name]'s voicemail.

## 2022-06-11 NOTE — ED Notes (Signed)
Called pt friend, [name] again. She returned my call but I wasn't able to speak with her at that moment. She has arrived to the ER and I told her I would give her an update when she makes it to the pt's room.

## 2022-06-11 NOTE — ED Provider Notes (Signed)
[facility] Provider Note    Event Date/Time   First MD Initiated Contact with Patient 06/11/22 1506     (approximate)   History   Arm Pain   HPI  [name] is a 72 y.o. 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  According fianc patient having been recently admitted for pain control related to tumor on MS Contin as well as oxycodone having decreased p.o. intake as well as confusion.  No reported falls but has had significant decline secondary to make pain medication but he is having worsening pain.  No measured fevers or chills.     Physical Exam   Triage Vital Signs: ED Triage Vitals  Enc Vitals Group     BP 06/11/22 1247 111/76     Pulse Rate 06/11/22 1247 84     Resp 06/11/22 1247 14     Temp 06/11/22 1247 98 F (36.7 C)     Temp Source 06/11/22 1247 Oral     SpO2 06/11/22 1247 95 %     Weight --      Height --      Head Circumference --      Peak Flow --      Pain Score 06/11/22 1245 0     Pain Loc --      Pain Edu? --      Excl. in [place]? --     Most recent vital signs: Vitals:   06/11/22 1930 06/11/22 2027  BP: 119/83 125/86  Pulse: 73 83  Resp: 14 20  Temp: 98.1 F (36.7 C) 97.9 F (36.6 C)  SpO2: 99% 99%     Constitutional: Alert  Eyes: Conjunctivae are normal.  Head: Atraumatic. Nose: No congestion/rhinnorhea. Mouth/Throat: Mucous membranes are moist.   Neck: Painless ROM.  Cardiovascular:   Good peripheral circulation. Respiratory: Normal respiratory effort.  No retractions.  Gastrointestinal: Soft and nontender in all four quadrants Musculoskeletal:  no deformity Neurologic:  No new gross focal neurologic deficits are appreciated. Dysarhtric speech at baseline  Skin:  Skin is warm, dry and intact. No rash noted. Psychiatric: Mood and affect are normal. Speech and behavior are normal.    ED  Results / Procedures / Treatments   Labs (all labs ordered are listed, but only abnormal results are displayed) Labs Reviewed  BASIC METABOLIC PANEL - Abnormal; Notable for the following components:      Result Value   Sodium 134 (*)    Chloride 95 (*)    Glucose, Bld 128 (*)    BUN 40 (*)    Creatinine, Ser 1.54 (*)    Calcium 10.9 (*)    GFR, Estimated 47 (*)    Anion gap 17 (*)    All other components within normal limits  CBC - Abnormal; Notable for the following components:   Hemoglobin 11.5 (*)    HCT 37.8 (*)    MCV 79.9 (*)    MCH 24.3 (*)    RDW 16.1 (*)    All other components within normal limits  GLUCOSE, CAPILLARY - Abnormal; Notable for the following components:   Glucose-Capillary 128 (*)    All other components within normal limits  LACTIC ACID, PLASMA  LACTIC ACID, PLASMA  URINALYSIS, ROUTINE W REFLEX MICROSCOPIC  BASIC METABOLIC PANEL  CBC  CBG MONITORING, ED  TROPONIN I (HIGH SENSITIVITY)  TROPONIN I (  HIGH SENSITIVITY)     EKG  ED ECG REPORT I, Merlyn Lot, the attending physician, personally viewed and interpreted this ECG.   Date: 06/11/2022  EKG Time: 12:54  Rate: 95  Rhythm: afib  Axis: normal  Intervals:normal qt  ST&T Change: nonspecific st abn, no stemi    RADIOLOGY Please see ED Course for my review and interpretation.  I personally reviewed all radiographic images ordered to evaluate for the above acute complaints and reviewed radiology reports and findings.  These findings were personally discussed with the patient.  Please see medical record for radiology report.    PROCEDURES:  Critical Care performed: No  Procedures   MEDICATIONS ORDERED IN ED: Medications  morphine (PF) 4 MG/ML injection 4 mg (4 mg Intravenous Given 06/11/22 1855)  allopurinol (ZYLOPRIM) tablet 100 mg (has no administration in time range)  aspirin EC tablet 81 mg (has no administration in time range)  colchicine tablet 0.6 mg (has no  administration in time range)  morphine (MS CONTIN) 12 hr tablet 15 mg (15 mg Oral Given 06/11/22 2151)  oxyCODONE (Oxy IR/ROXICODONE) immediate release tablet 5 mg (has no administration in time range)  metoprolol succinate (TOPROL-XL) 24 hr tablet 12.5 mg (has no administration in time range)  simvastatin (ZOCOR) tablet 20 mg (20 mg Oral Given 06/11/22 2151)  polyethylene glycol (MIRALAX / GLYCOLAX) packet 17 g (17 g Oral Given 06/11/22 2151)  senna-docusate (Senokot-S) tablet 1 tablet (1 tablet Oral Given 06/11/22 2151)  ferrous sulfate tablet 325 mg (has no administration in time range)  rivaroxaban (XARELTO) tablet 20 mg (has no administration in time range)  gabapentin (NEURONTIN) capsule 400 mg (has no administration in time range)  potassium chloride SA (KLOR-CON M) CR tablet 20 mEq (20 mEq Oral Given 06/11/22 2151)  albuterol (PROVENTIL) (2.5 MG/3ML) 0.083% nebulizer solution 3 mL (has no administration in time range)  latanoprost (XALATAN) 0.005 % ophthalmic solution 1 drop (1 drop Both Eyes Given 06/11/22 2231)  0.9 %  sodium chloride infusion ( Intravenous Restarted 06/11/22 2115)  acetaminophen (TYLENOL) tablet 650 mg (has no administration in time range)    Or  acetaminophen (TYLENOL) suppository 650 mg (has no administration in time range)  traZODone (DESYREL) tablet 25 mg (has no administration in time range)  magnesium hydroxide (MILK OF MAGNESIA) suspension 30 mL (has no administration in time range)  ondansetron (ZOFRAN) tablet 4 mg (has no administration in time range)    Or  ondansetron (ZOFRAN) injection 4 mg (has no administration in time range)  insulin aspart (novoLOG) injection 0-9 Units (has no administration in time range)  lactulose (CHRONULAC) 10 GM/15ML solution 30 g (30 g Oral Given 06/11/22 1557)  ondansetron (ZOFRAN) injection 4 mg (4 mg Intravenous Given 06/11/22 1854)  sodium chloride 0.9 % bolus 500 mL (0 mLs Intravenous Stopped 06/11/22 1932)  0.9 %  sodium  chloride infusion ( Intravenous New Bag/Given 06/11/22 2103)     IMPRESSION / MDM / ASSESSMENT AND PLAN / ED COURSE  I reviewed the triage vital signs and the nursing notes.                              Differential diagnosis includes, but is not limited to, malignancy related pain, pneumothorax, pneumonia, musculoskeletal strain, fracture, metastasis, SVC syndrome, PE  Patient presenting to the ER for evaluation of symptoms as described above.  Based on symptoms, risk factors and considered above differential, this presenting complaint  could reflect a potentially life-threatening illness therefore the patient will be placed on continuous pulse oximetry and telemetry for monitoring.  Laboratory evaluation will be sent to evaluate for the above complaints.  Will order pain medication.  Will order imaging for the above differential.  Patient on Xarelto low suspicion for PE is not hypoxic.   Clinical Course as of 06/11/22 2336  Fri Jun 11, 2022  1615 Chest x-ray on my review and interpreted without evidence of pneumothorax.  Given progression of worsening pain will repeat CT.  Has been compliant with the Xarelto not felt to be consistent with PE or dissection. [PR]  3532 NP noted to have mild anion gap elevation.  Bicarb normal.  Will check lactate will give IV fluids.  Have ordered IV morphine for pain. [PR]  1924 Patient complaining of severe persistent right shoulder pain.  Fianc at bedside stating that is concerned that amount of narcotic pain medication not controlling his pain right now is causing him significant confusion and significant fall risk.  Imaging without acute abnormalities.  Have ordered IV morphine does have mild AKI.  Will consult hospitalist for management and possible placement. [PR]    Clinical Course User Index [PR] Merlyn Lot, MD    FINAL CLINICAL IMPRESSION(S) / ED DIAGNOSES   Final diagnoses:  AKI (acute kidney injury) ([place])  Pancoast tumor of right lung  ([place])     Rx / DC Orders   ED Discharge Orders     None        Note:  This document was prepared using Dragon voice recognition software and may include unintentional dictation errors.    Merlyn Lot, MD 06/11/22 [phone]

## 2022-06-11 NOTE — Assessment & Plan Note (Addendum)
-  The patient will be placed on supplement coverage with NovoLog. - We will hold Jardiance. - We will continue Neurontin.

## 2022-06-11 NOTE — Assessment & Plan Note (Signed)
-  We will continue statin therapy. 

## 2022-06-11 NOTE — Assessment & Plan Note (Signed)
-  We will continue his Xarelto and Toprol-XL.

## 2022-06-11 NOTE — ED Triage Notes (Addendum)
Pt to ED via [facility] from home. Pt reports right arm pain x3 months. Pt reports taking oxycodone and morphine without relief PTA. Pt reported to Ems he is now "swimmy headed". Pt hard to understand on arrival.

## 2022-06-11 NOTE — ED Notes (Signed)
Pt states he has right arm pain for 3 months and has not gotten better. Pt also states that he hasn't had a bowel movement in 1.5 weeks.

## 2022-06-11 NOTE — Assessment & Plan Note (Signed)
-  This is fairly mild and could be related to volume depletion. - We will hydrate with IV normal saline and follow BMP.

## 2022-06-11 NOTE — Assessment & Plan Note (Signed)
-  This could be related to his right Pancoast tumor and partly mild dehydration. - He will be hydrated with IV normal saline and will follow his calcium level.
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
## 2022-06-11 NOTE — Assessment & Plan Note (Signed)
-  He has metastasis of T12 and erosion of the right first rib. - Pulmonary consult will be obtained. - The case was discussed with Dr. Lanney Gins.

## 2022-06-11 NOTE — Assessment & Plan Note (Signed)
-  The patient will be admitted to an observation medical telemetry bed. - We will continue hydration with IV normal saline. - We will follow BMPs. - We will avoid nephrotoxins.

## 2022-06-11 NOTE — Assessment & Plan Note (Addendum)
-  We will continue his Xarelto pending decision about lung biopsy. - We will check his coag profile.

## 2022-06-12 DIAGNOSIS — C3411 Malignant neoplasm of upper lobe, right bronchus or lung: Secondary | ICD-10-CM | POA: Diagnosis not present

## 2022-06-12 DIAGNOSIS — Z86711 Personal history of pulmonary embolism: Secondary | ICD-10-CM | POA: Diagnosis not present

## 2022-06-12 DIAGNOSIS — M25511 Pain in right shoulder: Secondary | ICD-10-CM | POA: Diagnosis not present

## 2022-06-12 DIAGNOSIS — N179 Acute kidney failure, unspecified: Secondary | ICD-10-CM | POA: Diagnosis not present

## 2022-06-12 DIAGNOSIS — G8929 Other chronic pain: Secondary | ICD-10-CM

## 2022-06-12 LAB — BASIC METABOLIC PANEL
Anion gap: 8 (ref 5–15)
BUN: 36 mg/dL — ABNORMAL HIGH (ref 8–23)
CO2: 28 mmol/L (ref 22–32)
Calcium: 9.9 mg/dL (ref 8.9–10.3)
Chloride: 98 mmol/L (ref 98–111)
Creatinine, Ser: 1.28 mg/dL — ABNORMAL HIGH (ref 0.61–1.24)
GFR, Estimated: 59 mL/min — ABNORMAL LOW (ref >60)
Glucose, Bld: 104 mg/dL — ABNORMAL HIGH (ref 70–99)
Potassium: 4 mmol/L (ref 3.5–5.1)
Sodium: 134 mmol/L — ABNORMAL LOW (ref 135–145)

## 2022-06-12 LAB — CBC
HCT: 35.9 % — ABNORMAL LOW (ref 39.0–52.0)
Hemoglobin: 10.9 g/dL — ABNORMAL LOW (ref 13.0–17.0)
MCH: 24 pg — ABNORMAL LOW (ref 26.0–34.0)
MCHC: 30.4 g/dL (ref 30.0–36.0)
MCV: 79.1 fL — ABNORMAL LOW (ref 80.0–100.0)
Platelets: 170 10*3/uL (ref 150–400)
RBC: 4.54 MIL/uL (ref 4.22–5.81)
RDW: 16.1 % — ABNORMAL HIGH (ref 11.5–15.5)
WBC: 4.4 10*3/uL (ref 4.0–10.5)
nRBC: 0 % (ref 0.0–0.2)

## 2022-06-12 LAB — GLUCOSE, CAPILLARY
Glucose-Capillary: 60 mg/dL — ABNORMAL LOW (ref 70–99)
Glucose-Capillary: 87 mg/dL (ref 70–99)
Glucose-Capillary: 105 mg/dL — ABNORMAL HIGH (ref 70–99)
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
## 2022-06-12 NOTE — Progress Notes (Signed)
Mobility Specialist - Progress Note     06/12/22 1000  Mobility  Activity Ambulated with assistance in room;Stood at bedside;Dangled on edge of bed;Ambulated with assistance to bathroom  Level of Assistance Standby assist, set-up cues, supervision of patient - no hands on  Assistive Device None  Distance Ambulated (ft) 15 ft  Activity Response Tolerated well  Mobility Referral Yes  $Mobility charge 1 Mobility   Pt resting EOB on RA upon entry. Pt STS and ambulates to bathroom SBA with no AD. Pt returned to bed and left with needs in reach and bed alarm activated.\   Loma Sender Mobility Specialist 06/12/22, 11:44 AM

## 2022-06-12 NOTE — Discharge Summary (Addendum)
Physician Discharge Summary   Patient: [name] MRN: [id number] DOB: 03-12-1951  Admit date:     06/11/2022  Discharge date: 06/12/22  Discharge Physician: Sharen Hones   PCP: [facility]   Recommendations at discharge:   Follow-up with PCP in 1 week. Follow-up with pulmonology as previous scheduled.  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 Resolved Problems:   * No resolved hospital problems. *  [name] is a 72 y.o. 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  He admitted to dyspnea without worsening cough or wheezing.  He had a mild dehydration with worsening renal function.  Received fluids.  He no longer has any confusion.  At this point, he is medically stable to be discharged. Assessment and Plan: * AKI (acute kidney injury) ([place]) Hyponatremia. Due to dehydration, renal functions better today.  Patient also has mild hyponatremia, which is stable.  Right shoulder pain 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HCC) Right shoulder pain is a part of the picture from Pancoast tumor with right lung.  Patient had to be seen by pulmonology, scheduled for biopsy in the near future.  Patient already taking pain medicine as well as Lidoderm.  Will  continue.  Follow-up with PCP as outpatient.   Hypercalcemia Improved after IV fluids.  Type 2 diabetes mellitus with diabetic polyneuropathy, without long-term current use of insulin (HCC) Hypoglycemia. Glucose running low, discontinue oral diabetic medicines.  Follow-up with PCP as outpatient.  Atrial fibrillation, chronic (HCC) - We will continue his Xarelto and Toprol-XL.  History of pulmonary embolism - We will continue his Xarelto pending decision about lung biopsy.  Dyslipidemia - We will continue statin therapy.        Consultants: Pulmonology Procedures performed: None  Disposition: Home Diet recommendation:  Discharge Diet Orders (From admission, onward)     Start     Ordered   06/12/22 0000  Diet - low sodium heart healthy        06/12/22 1113           Cardiac diet DISCHARGE MEDICATION: Allergies as of 06/12/2022   No Known Allergies      Medication List     STOP taking these medications    Jardiance 10 MG Tabs tablet Generic drug: empagliflozin       TAKE these medications    acetaminophen 325 MG tablet Commonly known as: TYLENOL Take 1-2 tablets (325-650 mg total) by mouth every 4 (four) hours as needed for mild pain.   albuterol 108 (90 Base) MCG/ACT inhaler Commonly known as: VENTOLIN HFA Inhale into the lungs every 6 (six) hours as needed for wheezing or shortness of breath.   allopurinol 100 MG tablet Commonly known as: ZYLOPRIM Take 100 mg by mouth daily.   aspirin EC 81 MG tablet Take 1 tablet (81 mg total) by mouth daily.  colchicine 0.6 MG tablet Take 1 tablet (0.6 mg total) by mouth daily. Stop taking when your symptoms resolve.   diclofenac Sodium 1 % Gel Commonly known as: VOLTAREN Apply topically.   FeroSul 325 (65 FE) MG tablet Generic drug: ferrous sulfate Take 0.5 tablets by mouth daily with breakfast.   furosemide 40 MG tablet Commonly known as: LASIX Take 40 mg by mouth 2 (two) times daily.    gabapentin 400 MG capsule Commonly known as: NEURONTIN Take 400 mg by mouth 3 (three) times daily.   latanoprost 0.005 % ophthalmic solution Commonly known as: XALATAN Place 1 drop into both eyes at bedtime.   lidocaine 5 % Commonly known as: Lidoderm Place 1 patch onto the skin every 12 (twelve) hours. Remove & Discard patch within 12 hours or as directed by MD   losartan 50 MG tablet Commonly known as: COZAAR Take 50 mg by mouth daily.   metoprolol succinate 25 MG 24 hr tablet Commonly known as: TOPROL-XL Take 12.5 mg by mouth daily.   MiraLax 17 GM/SCOOP powder Generic drug: polyethylene glycol powder Take 17 g by mouth daily.   morphine 15 MG 12 hr tablet Commonly known as: MS CONTIN Take 1 tablet (15 mg total) by mouth every 12 (twelve) hours.   ondansetron 4 MG tablet Commonly known as: ZOFRAN Take 1 tablet (4 mg total) by mouth every 6 (six) hours as needed for nausea.   oxyCODONE 5 MG immediate release tablet Commonly known as: Oxy IR/ROXICODONE Take 1 tablet (5 mg total) by mouth every 6 (six) hours as needed for breakthrough pain (MS contin).   potassium chloride SA 20 MEQ tablet Commonly known as: KLOR-CON M Take 1 tablet (20 mEq total) by mouth 2 (two) times daily. What changed: when to take this   rivaroxaban 20 MG Tabs tablet Commonly known as: XARELTO Take 1 tablet (20 mg total) by mouth daily with supper.   senna-docusate 8.6-50 MG tablet Commonly known as: Senokot-S Take 1 tablet by mouth at bedtime.   sildenafil 20 MG tablet Commonly known as: REVATIO Take 3-5 tablets 1 hour prior to intercourse as needed   simvastatin 20 MG tablet Commonly known as: ZOCOR Take 20 mg by mouth at bedtime.        [place] Follow up in 1 week(s).   Contact information: [place] [address] [phone]         Ottie Glazier, MD Follow up.   Specialty: Pulmonary  Disease Contact information: [place] [address] [phone]                Discharge Exam: There were no vitals filed for this visit. General exam: Appears calm and comfortable  Respiratory system: Clear to auscultation. Respiratory effort normal. Cardiovascular system: Irregular. No JVD, murmurs, rubs, gallops or clicks. No pedal edema. Gastrointestinal system: Abdomen is nondistended, soft and nontender. No organomegaly or masses felt. Normal bowel sounds heard. Central nervous system: Alert and oriented.  Aphasic, right arm weakness. Extremities: Symmetric 5 x 5 power. Skin: No rashes, lesions or ulcers Psychiatry: Mood & affect appropriate.    Condition at discharge: good  The results of significant diagnostics from this hospitalization (including imaging, microbiology, ancillary and laboratory) are listed below for reference.   Imaging Studies: CT HEAD WO CONTRAST (5MM)  Result Date: 06/11/2022 CLINICAL DATA:  Mental status change EXAM: CT HEAD WITHOUT CONTRAST TECHNIQUE: Contiguous axial images were obtained  from the base of the skull through the vertex without intravenous contrast. RADIATION DOSE REDUCTION: This exam was performed according to the departmental dose-optimization program which includes automated exposure control, adjustment of the mA and/or kV according to patient size and/or use of iterative reconstruction technique. COMPARISON:  CT 09/15/2021, 08/09/2021, 02/02/2020 FINDINGS: Brain: No acute territorial infarction, hemorrhage or intracranial mass. Multifocal chronic infarcts involving the left frontal and parietal lobes, bilateral occipital lobes and right cerebellum. Stable ex vacuo dilatation of left lateral ventricle. Vascular: No hyperdense vessels.  Carotid vascular calcification Skull: Normal. Negative for fracture or focal lesion. Sinuses/Orbits: No acute finding. Other: None IMPRESSION: 1. No CT evidence for acute intracranial  abnormality. 2. Multifocal chronic infarcts.  Mild atrophy Electronically Signed   By: Donavan Foil M.D.   On: 06/11/2022 18:24   CT CHEST WO CONTRAST  Result Date: 06/11/2022 CLINICAL DATA:  Chest wall pain. EXAM: CT CHEST WITHOUT CONTRAST TECHNIQUE: Multidetector CT imaging of the chest was performed following the standard protocol without IV contrast. RADIATION DOSE REDUCTION: This exam was performed according to the departmental dose-optimization program which includes automated exposure control, adjustment of the mA and/or kV according to patient size and/or use of iterative reconstruction technique. COMPARISON:  X-ray 06/11/2022.  CT angiogram 05/11/2022 FINDINGS: Cardiovascular: Heart is slightly enlarged. No significant pericardial effusion. The thoracic aorta on this noncontrast study has some scattered atherosclerotic calcified plaque. Unchanged from previous. Coronary artery calcifications are seen. Enlargement of the main pulmonary arteries there is some. Please correlate for pulmonary artery hypertension Mediastinum/Nodes: On this non IV contrast exam there is no specific abnormal lymph node enlargement present in the axillary region, hilum or mediastinum. There are several small less than 1 cm in size nodes identified in the mediastinum, not pathologic by size criteria. The exception is the known right-sided hilar lymph node measuring 2.5 cm in short axis which is again noted today on series 2, image 72 but less well defined without the advantage of IV contrast. Right-sided cystic thyroid nodule again noted. Lungs/Pleura: Breathing motion seen. There is some peripheral areas of scarring, fibrotic changes in both lungs as well as some reticulonodular areas. Few subpleural blebs identified in the upper lung zones. Once again there is an infiltrative mass involving the right lung apex, cupola of the lung with local bony invasion and erosive changes involving the first, second ribs as well as along  the right side of the vertebral body of T1. The mass does extend into the supraclavicular soft tissues in the adjacent musculature and was better defined on the previous contrast study with a diameter of 5.1 x 3.2 cm. Attempted measurement today without the advantage of IV contrast when measured in the same fashion would be more extensive at 5.5 by 5.5 cm. Please correlate with any prior workup. Stable separate nodular area measuring 9 mm along the extreme right inferior costophrenic angle on series 3, image 148. Upper Abdomen: In the upper abdomen the adrenal glands are preserved. There is atrophy of the left kidney. Of note the transverse colon has atypical distribution with components extending anterior to the liver margin between the liver in the anterior abdominal wall. Musculoskeletal: Erosive changes involving the right first and second rib in the right T1 vertebral level as above. Is also a lucent lesion involving the T11 vertebral body worrisome for potential osseous metastatic deposit recommend further workup. Whole-body bone scan may be useful as the next step in the workup with the other findings. IMPRESSION: Known right extreme  apical soft tissue mass with local bony erosive changes and invasion of the chest wall musculature worrisome for neoplasm. Again right hilar lymph node enlargement is noted but less well defined without the advantage of IV contrast. Lucent bone lesion at the T11 vertebral body. Worrisome for additional osseous metastatic deposit. No developing pneumothorax or effusion. Enlarged heart with enlarged main pulmonary artery. Aortic Atherosclerosis (ICD10-I70.0) and Emphysema (ICD10-J43.9). Electronically Signed   By: Jill Side M.D.   On: 06/11/2022 17:02   DG Chest Portable 1 View  Result Date: 06/11/2022 CLINICAL DATA:  Right shoulder pain, assess for pneumothorax. EXAM: PORTABLE CHEST 1 VIEW COMPARISON:  Chest radiograph and CT 05/10/2022 FINDINGS: Right apical opacity  corresponds to apical mass on prior CT. Destructive changes of the right first rib also seen on prior CT. No pneumothorax. Mild cardiomegaly, stable. Unchanged mediastinal contours. No acute airspace disease. No pleural fluid. No pulmonary edema. IMPRESSION: Right apical opacity corresponds to apical mass on prior CT. Destructive changes of the right first rib also seen on prior CT. No pneumothorax. Electronically Signed   By: Keith Rake M.D.   On: 06/11/2022 16:13    Microbiology: Results for orders placed or performed during the hospital encounter of 05/11/22  Resp panel by RT-PCR (RSV, Flu A&B, Covid) Anterior Nasal Swab     Status: None   Collection Time: 05/10/22  8:29 PM   Specimen: Anterior Nasal Swab  Result Value Ref Range Status   SARS Coronavirus 2 by RT PCR NEGATIVE NEGATIVE Final    Comment: (NOTE) SARS-CoV-2 target nucleic acids are NOT DETECTED.  The SARS-CoV-2 RNA is generally detectable in upper respiratory specimens during the acute phase of infection. The lowest concentration of SARS-CoV-2 viral copies this assay can detect is 138 copies/mL. A negative result does not preclude SARS-Cov-2 infection and should not be used as the sole basis for treatment or other patient management decisions. A negative result may occur with  improper specimen collection/handling, submission of specimen other than nasopharyngeal swab, presence of viral mutation(s) within the areas targeted by this assay, and inadequate number of viral copies(<138 copies/mL). A negative result must be combined with clinical observations, patient history, and epidemiological information. The expected result is Negative.  Fact Sheet for Patients:  EntrepreneurPulse.com.au  Fact Sheet for Healthcare Providers:  IncredibleEmployment.be  This test is no t yet approved or cleared by the [place] FDA and  has been authorized for detection and/or diagnosis of  SARS-CoV-2 by FDA under an Emergency Use Authorization (EUA). This EUA will remain  in effect (meaning this test can be used) for the duration of the COVID-19 declaration under Section 564(b)(1) of the Act, 21 U.S.C.section 360bbb-3(b)(1), unless the authorization is terminated  or revoked sooner.       Influenza A by PCR NEGATIVE NEGATIVE Final   Influenza B by PCR NEGATIVE NEGATIVE Final    Comment: (NOTE) The Xpert Xpress SARS-CoV-2/FLU/RSV plus assay is intended as an aid in the diagnosis of influenza from Nasopharyngeal swab specimens and should not be used as a sole basis for treatment. Nasal washings and aspirates are unacceptable for Xpert Xpress SARS-CoV-2/FLU/RSV testing.  Fact Sheet for Patients: EntrepreneurPulse.com.au  Fact Sheet for Healthcare Providers: IncredibleEmployment.be  This test is not yet approved or cleared by the [place] FDA and has been authorized for detection and/or diagnosis of SARS-CoV-2 by FDA under an Emergency Use Authorization (EUA). This EUA will remain in effect (meaning this test can be used) for the duration of  the COVID-19 declaration under Section 564(b)(1) of the Act, 21 U.S.C. section 360bbb-3(b)(1), unless the authorization is terminated or revoked.     Resp Syncytial Virus by PCR NEGATIVE NEGATIVE Final    Comment: (NOTE) Fact Sheet for Patients: EntrepreneurPulse.com.au  Fact Sheet for Healthcare Providers: IncredibleEmployment.be  This test is not yet approved or cleared by the [place] FDA and has been authorized for detection and/or diagnosis of SARS-CoV-2 by FDA under an Emergency Use Authorization (EUA). This EUA will remain in effect (meaning this test can be used) for the duration of the COVID-19 declaration under Section 564(b)(1) of the Act, 21 U.S.C. section 360bbb-3(b)(1), unless the authorization is terminated  or revoked.  Performed at [facility], [address] [employer], [place] [address]     Labs: CBC: Recent Labs  Lab 06/11/22 1249 06/12/22 0504  WBC 5.1 4.4  HGB 11.5* 10.9*  HCT 37.8* 35.9*  MCV 79.9* 79.1*  PLT 190 283   Basic Metabolic Panel: Recent Labs  Lab 06/11/22 1249 06/12/22 0504  NA 134* 134*  K 4.1 4.0  CL 95* 98  CO2 22 28  GLUCOSE 128* 104*  BUN 40* 36*  CREATININE 1.54* 1.28*  CALCIUM 10.9* 9.9   Liver Function Tests: No results for input(s): "AST", "ALT", "ALKPHOS", "BILITOT", "PROT", "ALBUMIN" in the last 168 hours. CBG: Recent Labs  Lab 06/11/22 2202 06/12/22 0803 06/12/22 0914  GLUCAP 128* 60* 87    Discharge time spent: greater than 30 minutes.  Signed: Sharen Hones, MD [facility] 06/12/2022

## 2022-06-18 ENCOUNTER — Other Ambulatory Visit: Payer: Self-pay | Admitting: *Deleted

## 2022-06-18 ENCOUNTER — Encounter: Payer: Self-pay | Admitting: *Deleted

## 2022-06-18 ENCOUNTER — Encounter: Payer: Self-pay | Admitting: Oncology

## 2022-06-18 ENCOUNTER — Inpatient Hospital Stay: Payer: Medicare Other

## 2022-06-18 ENCOUNTER — Inpatient Hospital Stay: Payer: Medicare Other | Attending: Oncology | Admitting: Oncology

## 2022-06-18 VITALS — BP 78/61 | HR 99 | Temp 97.5°F | Resp 18 | Ht 74.0 in | Wt 188.0 lb

## 2022-06-18 DIAGNOSIS — C3411 Malignant neoplasm of upper lobe, right bronchus or lung: Secondary | ICD-10-CM | POA: Diagnosis not present

## 2022-06-18 DIAGNOSIS — C7951 Secondary malignant neoplasm of bone: Secondary | ICD-10-CM | POA: Diagnosis not present

## 2022-06-18 DIAGNOSIS — R918 Other nonspecific abnormal finding of lung field: Secondary | ICD-10-CM

## 2022-06-18 DIAGNOSIS — R5383 Other fatigue: Secondary | ICD-10-CM

## 2022-06-18 DIAGNOSIS — M899 Disorder of bone, unspecified: Secondary | ICD-10-CM | POA: Diagnosis not present

## 2022-06-18 DIAGNOSIS — F17210 Nicotine dependence, cigarettes, uncomplicated: Secondary | ICD-10-CM

## 2022-06-18 DIAGNOSIS — I10 Essential (primary) hypertension: Secondary | ICD-10-CM

## 2022-06-18 DIAGNOSIS — G893 Neoplasm related pain (acute) (chronic): Secondary | ICD-10-CM | POA: Diagnosis not present

## 2022-06-18 DIAGNOSIS — R531 Weakness: Secondary | ICD-10-CM | POA: Diagnosis not present

## 2022-06-18 DIAGNOSIS — Z86718 Personal history of other venous thrombosis and embolism: Secondary | ICD-10-CM

## 2022-06-18 DIAGNOSIS — R634 Abnormal weight loss: Secondary | ICD-10-CM

## 2022-06-18 DIAGNOSIS — Z7189 Other specified counseling: Secondary | ICD-10-CM

## 2022-06-18 DIAGNOSIS — Z79899 Other long term (current) drug therapy: Secondary | ICD-10-CM

## 2022-06-18 LAB — CBC WITH DIFFERENTIAL/PLATELET
Abs Immature Granulocytes: 0.03 10*3/uL (ref 0.00–0.07)
Basophils Absolute: 0 10*3/uL (ref 0.0–0.1)
Basophils Relative: 0 %
Eosinophils Absolute: 0.1 10*3/uL (ref 0.0–0.5)
Eosinophils Relative: 1 %
HCT: 38.1 % — ABNORMAL LOW (ref 39.0–52.0)
Hemoglobin: 11.9 g/dL — ABNORMAL LOW (ref 13.0–17.0)
Immature Granulocytes: 1 %
Lymphocytes Relative: 12 %
Lymphs Abs: 0.6 10*3/uL — ABNORMAL LOW (ref 0.7–4.0)
MCH: 24.5 pg — ABNORMAL LOW (ref 26.0–34.0)
MCHC: 31.2 g/dL (ref 30.0–36.0)
MCV: 78.6 fL — ABNORMAL LOW (ref 80.0–100.0)
Monocytes Absolute: 0.5 10*3/uL (ref 0.1–1.0)
Monocytes Relative: 9 %
Neutro Abs: 4.2 10*3/uL (ref 1.7–7.7)
Neutrophils Relative %: 77 %
Platelets: 210 10*3/uL (ref 150–400)
RBC: 4.85 MIL/uL (ref 4.22–5.81)
RDW: 15.9 % — ABNORMAL HIGH (ref 11.5–15.5)
WBC: 5.4 10*3/uL (ref 4.0–10.5)
nRBC: 0 % (ref 0.0–0.2)

## 2022-06-18 LAB — COMPREHENSIVE METABOLIC PANEL
ALT: 11 U/L (ref 0–44)
AST: 17 U/L (ref 15–41)
Albumin: 3.7 g/dL (ref 3.5–5.0)
Alkaline Phosphatase: 53 U/L (ref 38–126)
Anion gap: 11 (ref 5–15)
BUN: 20 mg/dL (ref 8–23)
CO2: 28 mmol/L (ref 22–32)
Calcium: 11.5 mg/dL — ABNORMAL HIGH (ref 8.9–10.3)
Chloride: 97 mmol/L — ABNORMAL LOW (ref 98–111)
Creatinine, Ser: 1.44 mg/dL — ABNORMAL HIGH (ref 0.61–1.24)
GFR, Estimated: 51 mL/min — ABNORMAL LOW (ref >60)
Glucose, Bld: 119 mg/dL — ABNORMAL HIGH (ref 70–99)
Potassium: 4.7 mmol/L (ref 3.5–5.1)
Sodium: 136 mmol/L (ref 135–145)
Total Bilirubin: 0.4 mg/dL (ref 0.3–1.2)
Total Protein: 8.3 g/dL — ABNORMAL HIGH (ref 6.5–8.1)

## 2022-06-18 LAB — VITAMIN B12: Vitamin B-12: 538 pg/mL (ref 180–914)

## 2022-06-18 LAB — FERRITIN: Ferritin: 715 ng/mL — ABNORMAL HIGH (ref 24–336)

## 2022-06-18 LAB — IRON AND TIBC
Iron: 35 ug/dL — ABNORMAL LOW (ref 45–182)
Saturation Ratios: 12 % — ABNORMAL LOW (ref 17.9–39.5)
TIBC: 294 ug/dL (ref 250–450)
UIBC: 259 ug/dL

## 2022-06-18 LAB — FOLATE: Folate: 14.1 ng/mL (ref >5.9)

## 2022-06-18 MED ORDER — PREDNISONE 10 MG PO TABS: 10.0000 mg | ORAL_TABLET | Freq: Every day | ORAL | 0 refills | Status: DC

## 2022-06-18 MED ORDER — OXYCODONE HCL 10 MG PO TABS: 10.0000 mg | ORAL_TABLET | Freq: Four times a day (QID) | ORAL | 0 refills | Status: DC | PRN

## 2022-06-18 MED ORDER — MORPHINE SULFATE ER 15 MG PO TBCR: 15.0000 mg | EXTENDED_RELEASE_TABLET | Freq: Two times a day (BID) | ORAL | 0 refills | Status: DC

## 2022-06-18 NOTE — Progress Notes (Signed)
Pt with history of prostate cancer, pt and girlfriend in for new pt visit today for lung cancer.  Pt not feeling well today very weak.
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
## 2022-06-18 NOTE — Progress Notes (Signed)
Met with patient and his significant other to discuss concerning findings on recent CT scan. All questions answered during visit. Reviewed upcoming appts. Pt's significant other given phone number to call to schedule follow up visit with Dr. Teodoro Kil office. Informed pt's significant other that will call her this afternoon after clarifying his pain medication prescriptions with his pharmacy in order to make dosage adjustments if needed. Nothing else needed at this time. Contact info given and instructed to call with any questions or needs. Pt's significant other verbalized understanding.   After visit, spoke with Dr. Anselm Pancoast in IR and got approval that pt can be scheduled for CT guided lung mass biopsy after PET completed on 2/9. Biopsy checklist faxed and clearance letter to hold xarelto faxed to pt's PCP at this time. Pt will be informed of appt once scheduled. Will update pt and his significant other at follow up visit next week and to review further upcoming appts at that time.   Per Dr. Janese Banks, pt needs IV fluids and poss zometa next week as well. Pt scheduled and message left with pt's significant other [name] regarding appt for IV fluids on Monday 2/5 at 1:30pm. Also informed that prescriptions for pain medications have been sent into his pharmacy as well. Instructed to call back with any questions or needs but will follow up on Monday as well.

## 2022-06-21 ENCOUNTER — Inpatient Hospital Stay: Payer: Medicare Other

## 2022-06-21 ENCOUNTER — Other Ambulatory Visit: Payer: Self-pay | Admitting: Oncology

## 2022-06-21 ENCOUNTER — Inpatient Hospital Stay: Payer: Medicare Other | Admitting: Licensed Clinical Social Worker

## 2022-06-21 ENCOUNTER — Ambulatory Visit: Admission: RE | Admit: 2022-06-21 | Payer: Medicare Other | Source: Ambulatory Visit

## 2022-06-21 DIAGNOSIS — C3411 Malignant neoplasm of upper lobe, right bronchus or lung: Secondary | ICD-10-CM

## 2022-06-21 NOTE — Progress Notes (Signed)
[place] [facility]  Initial Assessment   [name] is a 72 y.o. year old male contacted caregiver by phone. Clinical Social Work was referred by medical provider for assessment of psychosocial needs.   SDOH (Social Determinants of Health) assessments performed: Yes SDOH Interventions    Flowsheet Row Clinical Support from 06/21/2022 in [place] at [place] Interventions   Alcohol Usage Interventions Intervention Not Indicated (Score <7)  Financial Strain Interventions Intervention Not Indicated  Physical Activity Interventions Intervention Not Indicated, Other (Comments)  [Request for Home health PT/OT and Aide for ADL assitance]  Stress Interventions Intervention Not Indicated  Social Connections Interventions Intervention Not Indicated       SDOH Screenings   Food Insecurity: No Food Insecurity (06/18/2022)  Housing: Low Risk  (06/18/2022)  Transportation Needs: No Transportation Needs (06/18/2022)  Utilities: Not At Risk (06/18/2022)  Alcohol Screen: Low Risk  (06/21/2022)  Depression (PHQ2-9): Medium Risk (06/18/2022)  Financial Resource Strain: Low Risk  (06/21/2022)  Physical Activity: Inactive (06/21/2022)  Social Connections: Socially Isolated (06/21/2022)  Stress: Stress Concern Present (06/21/2022)  Tobacco Use: High Risk (06/18/2022)     Distress Screen completed: Yes    06/18/2022    9:32 AM  ONCBCN DISTRESS SCREENING  Screening Type Initial Screening  Distress experienced in past week (1-10) 3      Family/Social Information:  Housing Arrangement: patient lives with   [name] (Significant other) [phone]  Family members/support persons in your life? Family and Medical Staff Transportation concerns: no  Employment: Retired  .  Income source: Paediatric nurse concerns: No Type of concern: None Food access concerns: no Religious or spiritual practice: Yes-Christian Services Currently in place:  [place]  health Care Medicare  Coping/ Adjustment to diagnosis: Patient understands treatment plan and what happens next? Yes, pending MRI scheduled for 06/21/2022 at 8:00 PM Concerns about diagnosis and/or treatment: Pain or discomfort during procedures, How will I care for myself, and Quality of life Patient reported stressors: Adjusting to my illness, Isolation/ feeling alone, and Physical issues Hopes and/or priorities: to get caregiver assistance Patient enjoys  N/A Current coping skills/ strengths: Armed forces logistics/support/administrative officer , Scientist, research (life sciences) , Motivation for treatment/growth , and Supportive family/friends     SUMMARY: Current SDOH Barriers:  Financial constraints related to fixed income, Limited social support, Level of care concerns, and ADL IADL limitations  Clinical Social Work Clinical Goal(s):  Patient will follow up with [facility] Medicare to request information on personal care services * as directed by SW  Interventions: Discussed common feeling and emotions when being diagnosed with cancer, and the importance of support during treatment Informed patient of the support team roles and support services at [name] Provided CSW contact information and encouraged patient to call with any questions or concern Referred patient to PACE  and Provided patient with information about CSW role in patient care and other available resource.  CSW updated oncologist and RN navigator, on patient's request for home health PT/OT and Aide.   Follow Up Plan: Patient will contact CSW with any support or resource needs Patient verbalizes understanding of plan: Yes    Trinisha Paget, LCSW

## 2022-06-22 ENCOUNTER — Ambulatory Visit: Payer: Medicare Other

## 2022-06-22 ENCOUNTER — Inpatient Hospital Stay: Payer: Medicare Other

## 2022-06-22 ENCOUNTER — Telehealth: Payer: Self-pay

## 2022-06-22 ENCOUNTER — Encounter: Payer: Self-pay | Admitting: *Deleted

## 2022-06-22 VITALS — BP 139/96 | HR 92 | Temp 97.9°F | Resp 20

## 2022-06-22 DIAGNOSIS — C3411 Malignant neoplasm of upper lobe, right bronchus or lung: Secondary | ICD-10-CM

## 2022-06-22 MED ORDER — SODIUM CHLORIDE 0.9 % IV SOLN
Freq: Once | INTRAVENOUS | Status: AC
  Filled 2022-06-22: qty 250

## 2022-06-22 NOTE — Telephone Encounter (Signed)
INCOMING CALL: from patient/friend.   PC SW explained reason for call and referral source for [facility] services. Patient and friend share that patient has an appointment at cancer center today and will discuss referral with provider. Patient has a number of upcoming appointments and has concern about adding additional services at this time.   PC SW will f/u with patient and friend tomorrow after they speak with provider and patients sister as she was looking into [facility] therapy for patient.

## 2022-06-22 NOTE — Progress Notes (Signed)
Met with pt's friend, [name], in the lobby to review upcoming appts while pt was receiving IVF. Informed of appts for follow up on 2/8, PET scan on 2/9, biopsy on 2/14, and follow up with Dr. Janese Banks on 2/21. Informed that pt needs to hold xarelto on 2/12, 2/13, and 2/14 prior to biopsy and may resume taking xarelto on 2/15. Instructions for PET scan and biopsy reviewed with [name]. All questions answered during visit. Reassurance provided. Will follow up again with pt and [name] at next clinic visit on 2/8.

## 2022-06-22 NOTE — Telephone Encounter (Signed)
Palliative care outreach to schedule initial in home PC visit.  Call unsuccessful. SW unable to LVM due to mailbox not being set up. Will attempt to outreach again at later time/date.

## 2022-06-23 ENCOUNTER — Other Ambulatory Visit: Payer: Medicare Other

## 2022-06-23 ENCOUNTER — Encounter: Payer: Self-pay | Admitting: Oncology

## 2022-06-23 DIAGNOSIS — Z515 Encounter for palliative care: Secondary | ICD-10-CM

## 2022-06-23 NOTE — Progress Notes (Signed)
COMMUNITY PALLIATIVE CARE SW NOTE  PATIENT NAME: [name] DOB: 26-May-1950 MRN: [id number]  PRIMARY CARE PROVIDER: [facility], [place]  RESPONSIBLE PARTY:  Acct ID - Guarantor Home Phone Work Phone Relationship Acct Type  [id number] - [name],* [phone]  Self P/F     [place], [place], [place] [address]                                           TELEPHONE ENCOUNTER  Initial palliative care encounter - PC  SW connected with patient and patients friend/caregiver [name].  Cancer: patient with lung mass. Has had MRI done yesterday and is scheduled to follow up on biopsy results on 2/21. Patient is receiving infusions multiple times a week, no adverse reactions reported.   [facility]: PT, Nursing, CNA started yesterday  Functional support: patients long term friend is providing all ADL support, meal prep, and medication management. Patient is able to toilet (I). Friend/caregiver is mostly afraid to leave patient alone at night time as he has had some falls.  Long term support: patients family has discussed placement for patient or PACE program, no decisions made so far.   Plan: palliative care will continue to follow. Follow up in person visit scheduled for 07/07/22 @10am .       SOCIAL HX:  Social History   Tobacco Use   Smoking status: Every Day    Packs/day: 0.13    Years: 50.00    Total pack years: 6.50    Types: Cigarettes   Smokeless tobacco: Never  Substance Use Topics   Alcohol use: No    CODE STATUS: FULL CODE ADVANCED DIRECTIVES: N MOST FORM COMPLETE:  N HOSPICE EDUCATION PROVIDED: N  PPS: patient is MIN-MOD A with ADL's.  Time spent: 30 min      [place], [place]

## 2022-06-24 ENCOUNTER — Inpatient Hospital Stay: Payer: Medicare Other

## 2022-06-24 ENCOUNTER — Inpatient Hospital Stay (HOSPITAL_BASED_OUTPATIENT_CLINIC_OR_DEPARTMENT_OTHER): Payer: Medicare Other | Admitting: Hospice and Palliative Medicine

## 2022-06-24 ENCOUNTER — Encounter: Payer: Self-pay | Admitting: *Deleted

## 2022-06-24 ENCOUNTER — Encounter: Payer: Self-pay | Admitting: Hospice and Palliative Medicine

## 2022-06-24 DIAGNOSIS — C3411 Malignant neoplasm of upper lobe, right bronchus or lung: Secondary | ICD-10-CM

## 2022-06-24 LAB — BASIC METABOLIC PANEL
Anion gap: 11 (ref 5–15)
BUN: 26 mg/dL — ABNORMAL HIGH (ref 8–23)
CO2: 29 mmol/L (ref 22–32)
Calcium: 11 mg/dL — ABNORMAL HIGH (ref 8.9–10.3)
Chloride: 97 mmol/L — ABNORMAL LOW (ref 98–111)
Creatinine, Ser: 1.24 mg/dL (ref 0.61–1.24)
GFR, Estimated: >60 mL/min (ref >60)
Glucose, Bld: 127 mg/dL — ABNORMAL HIGH (ref 70–99)
Potassium: 3.8 mmol/L (ref 3.5–5.1)
Sodium: 137 mmol/L (ref 135–145)

## 2022-06-24 MED ORDER — SODIUM CHLORIDE 0.9 % IV SOLN
Freq: Once | INTRAVENOUS | Status: AC
  Filled 2022-06-24: qty 250
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
## 2022-06-24 NOTE — Progress Notes (Signed)
Met with patient and his friend, [name] during follow up visit with Josh. All questions answered during visit. Pt's friend appeared overwhelmed with increased pt needs. States that is getting help from pt's sister to find him placement at Assisted Living. Reassurance provided. Reviewed upcoming appts in detail for upcoming PET, MRI, and biopsy. Reminded that pt needs to hold xarelto on 2-12-2/14 and may restart xarelto on 2/15. [name] verbalized understanding. Nothing further needed at this time. Instructed to call with any questions or needs.

## 2022-06-24 NOTE — Patient Instructions (Signed)
[place]  Discharge Instructions: Thank you for choosing [facility] to provide your oncology and hematology care.  If you have a lab appointment with the [facility], please go directly to the [facility] and check in at the registration area.  Wear comfortable clothing and clothing appropriate for easy access to any Portacath or PICC line.   We strive to give you quality time with your provider. You may need to reschedule your appointment if you arrive late (15 or more minutes).  Arriving late affects you and other patients whose appointments are after yours.  Also, if you miss three or more appointments without notifying the office, you may be dismissed from the clinic at the provider's discretion.      For prescription refill requests, have your pharmacy contact our office and allow 72 hours for refills to be completed.    Today you received the following chemotherapy and/or immunotherapy agents one liter fluid      To help prevent nausea and vomiting after your treatment, we encourage you to take your nausea medication as directed.  BELOW ARE SYMPTOMS THAT SHOULD BE REPORTED IMMEDIATELY: *FEVER GREATER THAN 100.4 F (38 C) OR HIGHER *CHILLS OR SWEATING *NAUSEA AND VOMITING THAT IS NOT CONTROLLED WITH YOUR NAUSEA MEDICATION *UNUSUAL SHORTNESS OF BREATH *UNUSUAL BRUISING OR BLEEDING *URINARY PROBLEMS (pain or burning when urinating, or frequent urination) *BOWEL PROBLEMS (unusual diarrhea, constipation, pain near the anus) TENDERNESS IN MOUTH AND THROAT WITH OR WITHOUT PRESENCE OF ULCERS (sore throat, sores in mouth, or a toothache) UNUSUAL RASH, SWELLING OR PAIN  UNUSUAL VAGINAL DISCHARGE OR ITCHING   Items with * indicate a potential emergency and should be followed up as soon as possible or go to the Emergency Department if any problems should occur.  Please show the CHEMOTHERAPY ALERT CARD or IMMUNOTHERAPY ALERT CARD at  check-in to the Emergency Department and triage nurse.  Should you have questions after your visit or need to cancel or reschedule your appointment, please contact [facility]  [phone] and follow the prompts.  Office hours are 8:00 a.m. to 4:30 p.m. Monday - Friday. Please note that voicemails left after 4:00 p.m. may not be returned until the following business day.  We are closed weekends and major holidays. You have access to a nurse at all times for urgent questions. Please call the main number to the clinic [phone] and follow the prompts.  For any non-urgent questions, you may also contact your provider using MyChart. We now offer e-Visits for anyone 15 and older to request care online for non-urgent symptoms. For details visit [email or web address].   Also download the MyChart app! Go to the app store, search "MyChart", open the app, select [facility], and log in with your MyChart username and password.  Dehydration, Adult Dehydration is a condition in which there is not enough water or other fluids in the body. This happens when a person loses more fluids than he or she takes in. Important organs, such as the kidneys, brain, and heart, cannot function without a proper amount of fluids. Any loss of fluids from the body can lead to dehydration. Dehydration can be mild, moderate, or severe. It should be treated right away to prevent it from becoming severe. What are the causes? Dehydration may be caused by: Conditions that cause loss of water or other fluids, such as diarrhea, vomiting, or sweating or urinating a lot. Not drinking enough fluids,  especially when you are ill or doing activities that require a lot of energy. Other illnesses and conditions, such as fever or infection. Certain medicines, such as medicines that remove excess fluid from the body (diuretics). Lack of safe drinking water. Not being able to get enough water and food. What  increases the risk? The following factors may make you more likely to develop this condition: Having a long-term (chronic) illness that has not been treated properly, such as diabetes, heart disease, or kidney disease. Being 87 years of age or older. Having a disability. Living in a place that is high in altitude, where thinner, drier air causes more fluid loss. Doing exercises that put stress on your body for a long time (endurance sports). What are the signs or symptoms? Symptoms of dehydration depend on how severe it is. Mild or moderate dehydration Thirst. Dry lips or dry mouth. Dizziness or light-headedness, especially when standing up from a seated position. Muscle cramps. Dark urine. Urine may be the color of tea. Less urine or tears produced than usual. Headache. Severe dehydration Changes in skin. Your skin may be cold and clammy, blotchy, or pale. Your skin also may not return to normal after being lightly pinched and released. Little or no tears, urine, or sweat. Changes in vital signs, such as rapid breathing and low blood pressure. Your pulse may be weak or may be faster than 100 beats a minute when you are sitting still. Other changes, such as: Feeling very thirsty. Sunken eyes. Cold hands and feet. Confusion. Being very tired (lethargic) or having trouble waking from sleep. Short-term weight loss. Loss of consciousness. How is this diagnosed? This condition is diagnosed based on your symptoms and a physical exam. You may have blood and urine tests to help confirm the diagnosis. How is this treated? Treatment for this condition depends on how severe it is. Treatment should be started right away. Do not wait until dehydration becomes severe. Severe dehydration is an emergency and needs to be treated in a hospital. Mild or moderate dehydration can be treated at home. You may be asked to: Drink more fluids. Drink an oral rehydration solution (ORS). This drink helps  restore proper amounts of fluids and salts and minerals in the blood (electrolytes). Severe dehydration can be treated: With IV fluids. By correcting abnormal levels of electrolytes. This is often done by giving electrolytes through a tube that is passed through your nose and into your stomach (nasogastric tube, or NG tube). By treating the underlying cause of dehydration. Follow these instructions at home: Oral rehydration solution If told by your health care provider, drink an ORS: Make an ORS by following instructions on the package. Start by drinking small amounts, about  cup (120 mL) every 5-10 minutes. Slowly increase how much you drink until you have taken the amount recommended by your health care provider. Eating and drinking        Drink enough clear fluid to keep your urine pale yellow. If you were told to drink an ORS, finish the ORS first and then start slowly drinking other clear fluids. Drink fluids such as: Water. Do not drink only water. Doing that can lead to hyponatremia, which is having too little salt (sodium) in the body. Water from ice chips you suck on. Fruit juice that you have added water to (diluted fruit juice). Low-calorie sports drinks. Eat foods that contain a healthy balance of electrolytes, such as bananas, oranges, potatoes, tomatoes, and spinach. Do not drink alcohol. Avoid  the following: Drinks that contain a lot of sugar. These include high-calorie sports drinks, fruit juice that is not diluted, and soda. Caffeine. Foods that are greasy or contain a lot of fat or sugar. General instructions Take over-the-counter and prescription medicines only as told by your health care provider. Do not take sodium tablets. Doing that can lead to having too much sodium in the body (hypernatremia). Return to your normal activities as told by your health care provider. Ask your health care provider what activities are safe for you. Keep all follow-up visits as told  by your health care provider. This is important. Contact a health care provider if: You have muscle cramps, pain, or discomfort, such as: Pain in your abdomen and the pain gets worse or stays in one area (localizes). Stiff neck. You have a rash. You are more irritable than usual. You are sleepier or have a harder time waking than usual. You feel weak or dizzy. You feel very thirsty. Get help right away if you have: Any symptoms of severe dehydration. Symptoms of vomiting, such as: You cannot eat or drink without vomiting. Vomiting gets worse or does not go away. Vomit includes blood or green matter (bile). Symptoms that get worse with treatment. A fever. A severe headache. Problems with urination or bowel movements, such as: Diarrhea that gets worse or does not go away. Blood in your stool (feces). This may cause stool to look black and tarry. Not urinating, or urinating only a small amount of very dark urine, within 6-8 hours. Trouble breathing. These symptoms may represent a serious problem that is an emergency. Do not wait to see if the symptoms will go away. Get medical help right away. Call your local emergency services (911 in the [place] Do not drive yourself to the hospital. Summary Dehydration is a condition in which there is not enough water or other fluids in the body. This happens when a person loses more fluids than he or she takes in. Treatment for this condition depends on how severe it is. Treatment should be started right away. Do not wait until dehydration becomes severe. Drink enough clear fluid to keep your urine pale yellow. If you were told to drink an oral rehydration solution (ORS), finish the ORS first and then start slowly drinking other clear fluids. Take over-the-counter and prescription medicines only as told by your health care provider. Get help right away if you have any symptoms of severe dehydration. This information is not intended to replace advice  given to you by your health care provider. Make sure you discuss any questions you have with your health care provider. Document Revised: 09/09/2021 Document Reviewed: 12/14/2018 Elsevier Patient Education  [place].

## 2022-06-25 ENCOUNTER — Encounter: Payer: Medicare Other | Admitting: Hospice and Palliative Medicine

## 2022-06-25 ENCOUNTER — Emergency Department: Payer: Medicare Other

## 2022-06-25 ENCOUNTER — Inpatient Hospital Stay
Admission: EM | Admit: 2022-06-25 | Discharge: 2022-07-02 | DRG: 181 | Disposition: A | Discharge status: Skilled Nursing Facility | Payer: Medicare Other | Attending: Internal Medicine | Admitting: Internal Medicine

## 2022-06-25 ENCOUNTER — Other Ambulatory Visit: Payer: Self-pay

## 2022-06-25 ENCOUNTER — Ambulatory Visit
Admission: RE | Admit: 2022-06-25 | Discharge: 2022-06-25 | Disposition: A | Discharge status: Home / Self Care | Payer: Medicare Other | Source: Ambulatory Visit | Attending: Oncology | Admitting: Oncology

## 2022-06-25 DIAGNOSIS — R918 Other nonspecific abnormal finding of lung field: Secondary | ICD-10-CM | POA: Diagnosis present

## 2022-06-25 DIAGNOSIS — M79601 Pain in right arm: Secondary | ICD-10-CM | POA: Diagnosis not present

## 2022-06-25 DIAGNOSIS — Z833 Family history of diabetes mellitus: Secondary | ICD-10-CM

## 2022-06-25 DIAGNOSIS — I5042 Chronic combined systolic (congestive) and diastolic (congestive) heart failure: Secondary | ICD-10-CM | POA: Diagnosis present

## 2022-06-25 DIAGNOSIS — R42 Dizziness and giddiness: Secondary | ICD-10-CM | POA: Diagnosis present

## 2022-06-25 DIAGNOSIS — I517 Cardiomegaly: Secondary | ICD-10-CM | POA: Diagnosis not present

## 2022-06-25 DIAGNOSIS — I872 Venous insufficiency (chronic) (peripheral): Secondary | ICD-10-CM | POA: Diagnosis present

## 2022-06-25 DIAGNOSIS — E878 Other disorders of electrolyte and fluid balance, not elsewhere classified: Secondary | ICD-10-CM | POA: Diagnosis present

## 2022-06-25 DIAGNOSIS — D649 Anemia, unspecified: Secondary | ICD-10-CM

## 2022-06-25 DIAGNOSIS — I69320 Aphasia following cerebral infarction: Secondary | ICD-10-CM

## 2022-06-25 DIAGNOSIS — E871 Hypo-osmolality and hyponatremia: Secondary | ICD-10-CM | POA: Diagnosis present

## 2022-06-25 DIAGNOSIS — M1990 Unspecified osteoarthritis, unspecified site: Secondary | ICD-10-CM | POA: Diagnosis present

## 2022-06-25 DIAGNOSIS — C7989 Secondary malignant neoplasm of other specified sites: Secondary | ICD-10-CM | POA: Diagnosis present

## 2022-06-25 DIAGNOSIS — I700 Atherosclerosis of aorta: Secondary | ICD-10-CM | POA: Insufficient documentation

## 2022-06-25 DIAGNOSIS — C3411 Malignant neoplasm of upper lobe, right bronchus or lung: Principal | ICD-10-CM | POA: Diagnosis present

## 2022-06-25 DIAGNOSIS — I482 Chronic atrial fibrillation, unspecified: Secondary | ICD-10-CM | POA: Diagnosis present

## 2022-06-25 DIAGNOSIS — Z7901 Long term (current) use of anticoagulants: Secondary | ICD-10-CM

## 2022-06-25 DIAGNOSIS — R531 Weakness: Principal | ICD-10-CM

## 2022-06-25 DIAGNOSIS — I69328 Other speech and language deficits following cerebral infarction: Secondary | ICD-10-CM

## 2022-06-25 DIAGNOSIS — Z515 Encounter for palliative care: Secondary | ICD-10-CM

## 2022-06-25 DIAGNOSIS — I739 Peripheral vascular disease, unspecified: Secondary | ICD-10-CM | POA: Diagnosis present

## 2022-06-25 DIAGNOSIS — R590 Localized enlarged lymph nodes: Secondary | ICD-10-CM | POA: Insufficient documentation

## 2022-06-25 DIAGNOSIS — Z86711 Personal history of pulmonary embolism: Secondary | ICD-10-CM

## 2022-06-25 DIAGNOSIS — E1151 Type 2 diabetes mellitus with diabetic peripheral angiopathy without gangrene: Secondary | ICD-10-CM | POA: Diagnosis present

## 2022-06-25 DIAGNOSIS — Z7952 Long term (current) use of systemic steroids: Secondary | ICD-10-CM

## 2022-06-25 DIAGNOSIS — M109 Gout, unspecified: Secondary | ICD-10-CM | POA: Diagnosis present

## 2022-06-25 DIAGNOSIS — E785 Hyperlipidemia, unspecified: Secondary | ICD-10-CM | POA: Diagnosis present

## 2022-06-25 DIAGNOSIS — M79603 Pain in arm, unspecified: Secondary | ICD-10-CM | POA: Diagnosis present

## 2022-06-25 DIAGNOSIS — E1142 Type 2 diabetes mellitus with diabetic polyneuropathy: Secondary | ICD-10-CM | POA: Diagnosis present

## 2022-06-25 DIAGNOSIS — M25511 Pain in right shoulder: Secondary | ICD-10-CM | POA: Diagnosis present

## 2022-06-25 DIAGNOSIS — S46219A Strain of muscle, fascia and tendon of other parts of biceps, unspecified arm, initial encounter: Secondary | ICD-10-CM | POA: Diagnosis present

## 2022-06-25 DIAGNOSIS — Z79899 Other long term (current) drug therapy: Secondary | ICD-10-CM

## 2022-06-25 DIAGNOSIS — E222 Syndrome of inappropriate secretion of antidiuretic hormone: Secondary | ICD-10-CM | POA: Diagnosis present

## 2022-06-25 DIAGNOSIS — R627 Adult failure to thrive: Secondary | ICD-10-CM | POA: Diagnosis present

## 2022-06-25 DIAGNOSIS — I2510 Atherosclerotic heart disease of native coronary artery without angina pectoris: Secondary | ICD-10-CM | POA: Diagnosis not present

## 2022-06-25 DIAGNOSIS — I69322 Dysarthria following cerebral infarction: Secondary | ICD-10-CM

## 2022-06-25 DIAGNOSIS — Z86718 Personal history of other venous thrombosis and embolism: Secondary | ICD-10-CM

## 2022-06-25 DIAGNOSIS — F17210 Nicotine dependence, cigarettes, uncomplicated: Secondary | ICD-10-CM | POA: Diagnosis present

## 2022-06-25 DIAGNOSIS — I10 Essential (primary) hypertension: Secondary | ICD-10-CM | POA: Diagnosis present

## 2022-06-25 DIAGNOSIS — D630 Anemia in neoplastic disease: Secondary | ICD-10-CM | POA: Diagnosis present

## 2022-06-25 DIAGNOSIS — I110 Hypertensive heart disease with heart failure: Secondary | ICD-10-CM | POA: Diagnosis present

## 2022-06-25 DIAGNOSIS — R52 Pain, unspecified: Secondary | ICD-10-CM | POA: Diagnosis present

## 2022-06-25 LAB — URINALYSIS, ROUTINE W REFLEX MICROSCOPIC
Bacteria, UA: NONE SEEN
Bilirubin Urine: NEGATIVE
Glucose, UA: 50 mg/dL — AB
Hgb urine dipstick: NEGATIVE
Ketones, ur: NEGATIVE mg/dL
Leukocytes,Ua: NEGATIVE
Nitrite: NEGATIVE
Protein, ur: 100 mg/dL — AB
Specific Gravity, Urine: 1.02 (ref 1.005–1.030)
pH: 5 (ref 5.0–8.0)

## 2022-06-25 LAB — BASIC METABOLIC PANEL
Anion gap: 9 (ref 5–15)
BUN: 29 mg/dL — ABNORMAL HIGH (ref 8–23)
CO2: 28 mmol/L (ref 22–32)
Calcium: 9.8 mg/dL (ref 8.9–10.3)
Chloride: 94 mmol/L — ABNORMAL LOW (ref 98–111)
Creatinine, Ser: 1.3 mg/dL — ABNORMAL HIGH (ref 0.61–1.24)
GFR, Estimated: 58 mL/min — ABNORMAL LOW (ref >60)
Glucose, Bld: 117 mg/dL — ABNORMAL HIGH (ref 70–99)
Potassium: 3.5 mmol/L (ref 3.5–5.1)
Sodium: 131 mmol/L — ABNORMAL LOW (ref 135–145)

## 2022-06-25 LAB — CBC
HCT: 30 % — ABNORMAL LOW (ref 39.0–52.0)
Hemoglobin: 9.1 g/dL — ABNORMAL LOW (ref 13.0–17.0)
MCH: 24.1 pg — ABNORMAL LOW (ref 26.0–34.0)
MCHC: 30.3 g/dL (ref 30.0–36.0)
MCV: 79.6 fL — ABNORMAL LOW (ref 80.0–100.0)
Platelets: 180 10*3/uL (ref 150–400)
RBC: 3.77 MIL/uL — ABNORMAL LOW (ref 4.22–5.81)
RDW: 15.9 % — ABNORMAL HIGH (ref 11.5–15.5)
WBC: 6.4 10*3/uL (ref 4.0–10.5)
nRBC: 0 % (ref 0.0–0.2)

## 2022-06-25 LAB — HEMOGLOBIN AND HEMATOCRIT, BLOOD
HCT: 30.7 % — ABNORMAL LOW (ref 39.0–52.0)
Hemoglobin: 9.4 g/dL — ABNORMAL LOW (ref 13.0–17.0)

## 2022-06-25 LAB — GLUCOSE, CAPILLARY: Glucose-Capillary: 107 mg/dL — ABNORMAL HIGH (ref 70–99)

## 2022-06-25 LAB — TROPONIN I (HIGH SENSITIVITY): Troponin I (High Sensitivity): 12 ng/L (ref <18)

## 2022-06-25 MED ORDER — FLUDEOXYGLUCOSE F - 18 (FDG) INJECTION
9.7000 | Freq: Once | INTRAVENOUS | Status: AC
  Administered 2022-06-25: 9.55 via INTRAVENOUS

## 2022-06-25 MED ORDER — MORPHINE SULFATE (PF) 2 MG/ML IV SOLN
2.0000 mg | Freq: Once | INTRAVENOUS | Status: AC
  Administered 2022-06-25: 2 mg via INTRAVENOUS
  Filled 2022-06-25: qty 1

## 2022-06-25 NOTE — ED Triage Notes (Signed)
Pt brought in by [facility] from home.  Per EMS pt rpting generalized weakness.  Pt has a hx of stroke with R sided deficits and a.fib.  Per EMS rpts pt currently being screened for cancer as mass found in L upper shoulder.  BS: 180 per EMS.  Pt had PET scan with other imaging to continue per EMS

## 2022-06-25 NOTE — ED Notes (Signed)
Provider Archie Balboa notified pt requesting Latanoprost eye drops and 10/10 pain following med administration and imaging

## 2022-06-25 NOTE — ED Notes (Signed)
XR at bedside

## 2022-06-25 NOTE — ED Notes (Signed)
Pt to CT

## 2022-06-25 NOTE — ED Notes (Signed)
Pt back in ED

## 2022-06-25 NOTE — ED Provider Notes (Signed)
[facility] Provider Note    Event Date/Time   First MD Initiated Contact with Patient 06/25/22 1622     (approximate)   History   Right shoulder pain   HPI  Shaheed Schmuck is a 72 y.o. male  who presents to the emergency department today with primary complaint of right shoulder pain. States it has been hurting him for months. Says it was hurting a little more today. Denies any change in the quality of the pain. Patient had secondary complaint of headache that also has been going on for months but was a little worse today. Patient did not state he was weak until explicitly asked by myself. He states he did feel a little weak today. Generalized weakness.   Physical Exam   Triage Vital Signs: ED Triage Vitals  Enc Vitals Group     BP 06/25/22 1611 119/75     Pulse Rate 06/25/22 1611 64     Resp 06/25/22 1611 16     Temp 06/25/22 1611 98.3 F (36.8 C)     Temp Source 06/25/22 1611 Oral     SpO2 06/25/22 1611 97 %     Weight 06/25/22 1604 180 lb (81.6 kg)     Height 06/25/22 1604 6\' 2"  (1.88 m)     Head Circumference --      Peak Flow --      Pain Score 06/25/22 1603 8     Pain Loc --      Pain Edu? --      Excl. in [place]? --     Most recent vital signs: Vitals:   06/25/22 1611  BP: 119/75  Pulse: 64  Resp: 16  Temp: 98.3 F (36.8 C)  SpO2: 97%   General: Awake, alert, oriented. CV:  Good peripheral perfusion. Regular rate and rhythm. Resp:  Normal effort. Lungs clear. Abd:  No distention.     ED Results / Procedures / Treatments   Labs (all labs ordered are listed, but only abnormal results are displayed) Labs Reviewed  BASIC METABOLIC PANEL - Abnormal; Notable for the following components:      Result Value   Sodium 131 (*)    Chloride 94 (*)    Glucose, Bld 117 (*)    BUN 29 (*)    Creatinine, Ser 1.30 (*)    GFR, Estimated 58 (*)    All other components within normal limits  CBC - Abnormal; Notable for the following  components:   RBC 3.77 (*)    Hemoglobin 9.1 (*)    HCT 30.0 (*)    MCV 79.6 (*)    MCH 24.1 (*)    RDW 15.9 (*)    All other components within normal limits  URINALYSIS, ROUTINE W REFLEX MICROSCOPIC - Abnormal; Notable for the following components:   Color, Urine YELLOW (*)    APPearance CLEAR (*)    Glucose, UA 50 (*)    Protein, ur 100 (*)    All other components within normal limits  HEMOGLOBIN AND HEMATOCRIT, BLOOD - Abnormal; Notable for the following components:   Hemoglobin 9.4 (*)    HCT 30.7 (*)    All other components within normal limits  CBG MONITORING, ED  TROPONIN I (HIGH SENSITIVITY)     EKG  I, Nance Pear, attending physician, personally viewed and interpreted this EKG  EKG Time: 1617 Rate: 64 Rhythm: atrial fibrillation Axis: normal Intervals: qtc 412 QRS: narrow, q waves v1, v2 ST changes: no  st elevation Impression: abnormal ekg   RADIOLOGY I independently interpreted and visualized the CT head. My interpretation: No acute bleed Radiology interpretation:  IMPRESSION:  1. No acute intracranial CT findings or interval changes.  2. 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  3. Sinus disease.    I independently interpreted and visualized the CXR. 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     PROCEDURES:  Critical Care performed: No  Procedures   MEDICATIONS ORDERED IN ED: Medications - No data to display   IMPRESSION / MDM / [place] / ED COURSE  I reviewed the triage vital signs and the nursing notes.                              Differential diagnosis includes, but is not limited to, infection, anemia, dehydration, CVA  Patient's presentation is most consistent with acute presentation with potential threat to life or bodily  function.  Patient presented to the emergency department today with primary concern for right shoulder pain.  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  X-ray does show this finding without any changes.  I think this explains the patient pain.  In addition he did have some weakness.  Blood work here does show some anemia which is worse than it was on recent blood work.  Patient was guaiac negative.  Repeat blood test with essentially unchanged anemia.  At this time I have low concern for ongoing bleeding.  Additionally no obvious etiology for any infections at this time.  However when we attempted to ambulate the patient he was unable to ambulate safely.  Will plan on discussing with hospitalist service for admission.  FINAL CLINICAL IMPRESSION(S) / ED DIAGNOSES   Final diagnoses:  Weakness  Anemia, unspecified type      Note:  This document was prepared using Dragon voice recognition software and may include unintentional dictation errors.    Nance Pear, MD 06/26/22 [phone]
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
## 2022-06-26 ENCOUNTER — Encounter: Payer: Self-pay | Admitting: Family Medicine

## 2022-06-26 ENCOUNTER — Other Ambulatory Visit: Payer: Self-pay

## 2022-06-26 ENCOUNTER — Ambulatory Visit: Admission: RE | Admit: 2022-06-26 | Payer: Medicare Other | Source: Ambulatory Visit

## 2022-06-26 DIAGNOSIS — E871 Hypo-osmolality and hyponatremia: Secondary | ICD-10-CM

## 2022-06-26 DIAGNOSIS — R531 Weakness: Secondary | ICD-10-CM

## 2022-06-26 DIAGNOSIS — I10 Essential (primary) hypertension: Secondary | ICD-10-CM

## 2022-06-26 DIAGNOSIS — E785 Hyperlipidemia, unspecified: Secondary | ICD-10-CM

## 2022-06-26 DIAGNOSIS — Z86711 Personal history of pulmonary embolism: Secondary | ICD-10-CM

## 2022-06-26 DIAGNOSIS — D649 Anemia, unspecified: Secondary | ICD-10-CM

## 2022-06-26 DIAGNOSIS — E1142 Type 2 diabetes mellitus with diabetic polyneuropathy: Secondary | ICD-10-CM | POA: Diagnosis not present

## 2022-06-26 DIAGNOSIS — I5042 Chronic combined systolic (congestive) and diastolic (congestive) heart failure: Secondary | ICD-10-CM

## 2022-06-26 LAB — BASIC METABOLIC PANEL
Anion gap: 9 (ref 5–15)
BUN: 26 mg/dL — ABNORMAL HIGH (ref 8–23)
CO2: 27 mmol/L (ref 22–32)
Calcium: 9.8 mg/dL (ref 8.9–10.3)
Chloride: 100 mmol/L (ref 98–111)
Creatinine, Ser: 1.02 mg/dL (ref 0.61–1.24)
GFR, Estimated: >60 mL/min (ref >60)
Glucose, Bld: 104 mg/dL — ABNORMAL HIGH (ref 70–99)
Potassium: 3.7 mmol/L (ref 3.5–5.1)
Sodium: 136 mmol/L (ref 135–145)

## 2022-06-26 LAB — CBC
HCT: 30.5 % — ABNORMAL LOW (ref 39.0–52.0)
Hemoglobin: 9.4 g/dL — ABNORMAL LOW (ref 13.0–17.0)
MCH: 24.7 pg — ABNORMAL LOW (ref 26.0–34.0)
MCHC: 30.8 g/dL (ref 30.0–36.0)
MCV: 80.3 fL (ref 80.0–100.0)
Platelets: 174 10*3/uL (ref 150–400)
RBC: 3.8 MIL/uL — ABNORMAL LOW (ref 4.22–5.81)
RDW: 16.1 % — ABNORMAL HIGH (ref 11.5–15.5)
WBC: 6.5 10*3/uL (ref 4.0–10.5)
nRBC: 0 % (ref 0.0–0.2)

## 2022-06-26 LAB — HEMOGLOBIN AND HEMATOCRIT, BLOOD
HCT: 31.5 % — ABNORMAL LOW (ref 39.0–52.0)
HCT: 30.8 % — ABNORMAL LOW (ref 39.0–52.0)
Hemoglobin: 9.7 g/dL — ABNORMAL LOW (ref 13.0–17.0)
Hemoglobin: 9.3 g/dL — ABNORMAL LOW (ref 13.0–17.0)

## 2022-06-26 LAB — MAGNESIUM: Magnesium: 1.8 mg/dL (ref 1.7–2.4)

## 2022-06-26 MED ORDER — SULFAMETHOXAZOLE-TRIMETHOPRIM 400-80 MG PO TABS
1.0000 | ORAL_TABLET | ORAL | Status: DC
  Administered 2022-06-28 – 2022-07-02 (×3): 1 via ORAL
  Filled 2022-06-26 (×3): qty 1

## 2022-06-26 MED ORDER — METOPROLOL SUCCINATE ER 25 MG PO TB24
12.5000 mg | ORAL_TABLET | Freq: Every day | ORAL | Status: DC
  Administered 2022-06-26 – 2022-07-01 (×6): 12.5 mg via ORAL
  Filled 2022-06-26 (×7): qty 1

## 2022-06-26 MED ORDER — MORPHINE SULFATE (PF) 2 MG/ML IV SOLN
2.0000 mg | INTRAVENOUS | Status: DC | PRN
  Administered 2022-06-26 (×3): 2 mg via INTRAVENOUS
  Filled 2022-06-26 (×3): qty 1

## 2022-06-26 MED ORDER — SIMVASTATIN 20 MG PO TABS
20.0000 mg | ORAL_TABLET | Freq: Every day | ORAL | Status: DC
  Administered 2022-06-26 – 2022-07-01 (×6): 20 mg via ORAL
  Filled 2022-06-26 (×6): qty 1

## 2022-06-26 MED ORDER — MORPHINE SULFATE ER 15 MG PO TBCR
30.0000 mg | EXTENDED_RELEASE_TABLET | Freq: Two times a day (BID) | ORAL | Status: DC
  Administered 2022-06-26 – 2022-06-28 (×5): 30 mg via ORAL
  Filled 2022-06-26 (×5): qty 2

## 2022-06-26 MED ORDER — MAGNESIUM HYDROXIDE 400 MG/5ML PO SUSP
30.0000 mL | Freq: Every day | ORAL | Status: DC | PRN
  Administered 2022-06-26 – 2022-06-27 (×2): 30 mL via ORAL
  Filled 2022-06-26 (×2): qty 30

## 2022-06-26 MED ORDER — ACETAMINOPHEN 650 MG RE SUPP: 650.0000 mg | Freq: Four times a day (QID) | RECTAL | Status: DC | PRN

## 2022-06-26 MED ORDER — SODIUM CHLORIDE 0.9 % IV SOLN: INTRAVENOUS | Status: DC

## 2022-06-26 MED ORDER — OXYCODONE HCL 5 MG PO TABS
10.0000 mg | ORAL_TABLET | ORAL | Status: DC | PRN
  Administered 2022-06-26 – 2022-06-30 (×12): 15 mg via ORAL
  Administered 2022-07-01 – 2022-07-02 (×3): 10 mg via ORAL
  Filled 2022-06-26 (×3): qty 3
  Filled 2022-06-26: qty 2
  Filled 2022-06-26 (×2): qty 3
  Filled 2022-06-26 (×2): qty 2
  Filled 2022-06-26 (×8): qty 3

## 2022-06-26 MED ORDER — ONDANSETRON HCL 4 MG PO TABS: 4.0000 mg | ORAL_TABLET | Freq: Four times a day (QID) | ORAL | Status: DC | PRN

## 2022-06-26 MED ORDER — PREDNISONE 10 MG PO TABS
10.0000 mg | ORAL_TABLET | Freq: Every day | ORAL | Status: DC
  Administered 2022-06-27 – 2022-07-02 (×6): 10 mg via ORAL
  Filled 2022-06-26 (×6): qty 1

## 2022-06-26 MED ORDER — SENNA 8.6 MG PO TABS
2.0000 | ORAL_TABLET | Freq: Every day | ORAL | Status: DC
  Filled 2022-06-26: qty 2

## 2022-06-26 MED ORDER — FLEET ENEMA 7-19 GM/118ML RE ENEM
1.0000 | ENEMA | Freq: Once | RECTAL | Status: AC
  Administered 2022-06-26: 1 via RECTAL

## 2022-06-26 MED ORDER — LATANOPROST 0.005 % OP SOLN
1.0000 [drp] | Freq: Every day | OPHTHALMIC | Status: DC
  Administered 2022-06-27 – 2022-07-01 (×5): 1 [drp] via OPHTHALMIC
  Filled 2022-06-26 (×2): qty 2.5

## 2022-06-26 MED ORDER — ENOXAPARIN SODIUM 40 MG/0.4ML IJ SOSY
40.0000 mg | PREFILLED_SYRINGE | INTRAMUSCULAR | Status: DC
  Administered 2022-06-26: 40 mg via SUBCUTANEOUS
  Filled 2022-06-26: qty 0.4

## 2022-06-26 MED ORDER — ONDANSETRON HCL 4 MG/2ML IJ SOLN: 4.0000 mg | Freq: Four times a day (QID) | INTRAMUSCULAR | Status: DC | PRN

## 2022-06-26 MED ORDER — OXYCODONE HCL 5 MG PO TABS: 10.0000 mg | ORAL_TABLET | Freq: Four times a day (QID) | ORAL | Status: DC | PRN

## 2022-06-26 MED ORDER — ACETAMINOPHEN 325 MG PO TABS
650.0000 mg | ORAL_TABLET | Freq: Four times a day (QID) | ORAL | Status: DC | PRN
  Administered 2022-06-27: 650 mg via ORAL
  Filled 2022-06-26: qty 2

## 2022-06-26 MED ORDER — RIVAROXABAN 15 MG PO TABS
15.0000 mg | ORAL_TABLET | Freq: Every day | ORAL | Status: DC
  Administered 2022-06-26 – 2022-06-27 (×2): 15 mg via ORAL
  Filled 2022-06-26 (×2): qty 1

## 2022-06-26 MED ORDER — TRAZODONE HCL 50 MG PO TABS: 25.0000 mg | ORAL_TABLET | Freq: Every evening | ORAL | Status: DC | PRN

## 2022-06-26 MED ORDER — DOCUSATE SODIUM 100 MG PO CAPS
100.0000 mg | ORAL_CAPSULE | Freq: Two times a day (BID) | ORAL | Status: DC
  Administered 2022-06-26 – 2022-07-02 (×12): 100 mg via ORAL
  Filled 2022-06-26 (×13): qty 1

## 2022-06-26 MED ORDER — ALLOPURINOL 100 MG PO TABS
100.0000 mg | ORAL_TABLET | Freq: Every day | ORAL | Status: DC
  Administered 2022-06-26 – 2022-07-02 (×7): 100 mg via ORAL
  Filled 2022-06-26 (×7): qty 1

## 2022-06-26 MED ORDER — BISACODYL 10 MG RE SUPP: 10.0000 mg | Freq: Every day | RECTAL | Status: DC | PRN

## 2022-06-26 MED ORDER — ALBUTEROL SULFATE (2.5 MG/3ML) 0.083% IN NEBU: 3.0000 mL | INHALATION_SOLUTION | Freq: Four times a day (QID) | RESPIRATORY_TRACT | Status: DC | PRN

## 2022-06-26 MED ORDER — GABAPENTIN 400 MG PO CAPS
400.0000 mg | ORAL_CAPSULE | Freq: Three times a day (TID) | ORAL | Status: DC
  Administered 2022-06-26 – 2022-07-02 (×19): 400 mg via ORAL
  Filled 2022-06-26 (×19): qty 1

## 2022-06-26 MED ORDER — SENNOSIDES-DOCUSATE SODIUM 8.6-50 MG PO TABS
1.0000 | ORAL_TABLET | Freq: Two times a day (BID) | ORAL | Status: DC
  Administered 2022-06-26 – 2022-07-02 (×12): 1 via ORAL
  Filled 2022-06-26 (×13): qty 1

## 2022-06-26 NOTE — Assessment & Plan Note (Signed)
Continue allopurinol 

## 2022-06-26 NOTE — Assessment & Plan Note (Addendum)
-   Continue home regimen 

## 2022-06-26 NOTE — Assessment & Plan Note (Signed)
supplemental coverage with NovoLog. continue Neurontin. 

## 2022-06-26 NOTE — Progress Notes (Signed)
OT Cancellation Note  Patient Details Name: [name] MRN: [id number] DOB: 1950/08/10   Cancelled Treatment:     Attempted to see patient this date, lethargic in the pm, will re attempt next date for active participation in OT evaluation.   Rodina Pinales T Tomasita Morrow, OTR/L, CLT  Manu Rubey 06/26/2022, 2:51 PM

## 2022-06-26 NOTE — Assessment & Plan Note (Addendum)
Na 131 on admission

## 2022-06-26 NOTE — Assessment & Plan Note (Addendum)
Likely due to progressive malignancy and symptomatic anemia. - Negative hemoccult and no blood in stools - PT/OT recommend SNF --TOC following for placement.

## 2022-06-26 NOTE — Progress Notes (Signed)
Brief rounding note, same day as admission  HPI: Pt with history of right apical lung Pancoast tumor was admitted after midnight for intractable right shoulder pain.   See H&P for full detailed history on admission.  Interval history: Pt seated edge of bed eating breakfast this AM. He reports ongoing excruciating right shoulder pain. He denies any other complaints besides pain and constipation.  States last BM was two weeks ago.  He states he's been in misery due to pain for 3 weeks.    Exam: General exam: awake, alert, no acute distress HEENT: moist mucus membranes, hearing grossly normal  Respiratory system: CTAB diminished on right, no wheezes, rales or rhonchi, normal respiratory effort. Cardiovascular system: normal S1/S2, RRR, no pedal edema.   Gastrointestinal system: soft, NT, ND Central nervous system: A&O x3. no gross focal neurologic deficits, normal speech Extremities: moves all, no edema, normal tone Skin: dry, intact, normal temperature Psychiatry: normal mood, congruent affect, judgement and insight appear normal   A&P: as per H&P by Dr. Sidney Ace, with any changes or additions as below:  --Resume home MS Contin 15 mg BID --Increase home oxycodone from 10 mg Q6H PRN >> 10-15 mg Q4H PRN --Titrate pain meds for adequate relief --Stop IV fluids --Home meds reconciled -- continue albuterol, allopurinol, gabapentin, Zocor, prednisone, Bactrim, metoprolol, Xarelto, stool softeners.   HOLD home Lasix and Cozaar for now to avoid hypotension as BP's are controlled. --Dulcolax suppository --Fleet enema x 1 today     No charge

## 2022-06-26 NOTE — ED Notes (Signed)
Attempted to observe pt ambulation while using walker, but pt very unsteady, and needed assistance to prevent falling. Gait very unsteady, and leaning backward throughout ambulation trial. Dr. Curly [place]

## 2022-06-26 NOTE — Assessment & Plan Note (Signed)
Resume Xarelto 

## 2022-06-26 NOTE — Assessment & Plan Note (Addendum)
Continue statin. 

## 2022-06-26 NOTE — Plan of Care (Signed)
  Problem: Education: Goal: Knowledge of General Education information will improve Description: Including pain rating scale, medication(s)/side effects and non-pharmacologic comfort measures Outcome: Progressing   Problem: Health Behavior/Discharge Planning: Goal: Ability to manage health-related needs will improve Outcome: Progressing   Problem: Clinical Measurements: Goal: Ability to maintain clinical measurements within normal limits will improve Outcome: Progressing   Problem: Activity: Goal: Risk for activity intolerance will decrease Outcome: Progressing   Problem: Coping: Goal: Level of anxiety will decrease Outcome: Progressing   Problem: Elimination: Goal: Will not experience complications related to bowel motility Outcome: Progressing   Problem: Pain Managment: Goal: General experience of comfort will improve Outcome: Progressing   Problem: Safety: Goal: Ability to remain free from injury will improve Outcome: Progressing   
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
## 2022-06-26 NOTE — Progress Notes (Signed)
PT Cancellation Note  Patient Details Name: [name] MRN: [id number] DOB: 10/31/1950   Cancelled Treatment:    Reason Eval/Treat Not Completed: Fatigue/lethargy limiting ability to participate. Patient initially asked that I return at 2 pm after lunch, when I returned patient is very lethargic (suspect he had pain medication). Unable to get accurate assessment of mobility at this time. Will return to assess mobility tomorrow.     Laqueisha Catalina 06/26/2022, 2:03 PM

## 2022-06-27 DIAGNOSIS — R531 Weakness: Secondary | ICD-10-CM | POA: Diagnosis not present

## 2022-06-27 LAB — BASIC METABOLIC PANEL
Anion gap: 8 (ref 5–15)
BUN: 18 mg/dL (ref 8–23)
CO2: 26 mmol/L (ref 22–32)
Calcium: 10.3 mg/dL (ref 8.9–10.3)
Chloride: 98 mmol/L (ref 98–111)
Creatinine, Ser: 0.85 mg/dL (ref 0.61–1.24)
GFR, Estimated: >60 mL/min (ref >60)
Glucose, Bld: 103 mg/dL — ABNORMAL HIGH (ref 70–99)
Potassium: 4.1 mmol/L (ref 3.5–5.1)
Sodium: 132 mmol/L — ABNORMAL LOW (ref 135–145)

## 2022-06-27 MED ORDER — MORPHINE SULFATE (PF) 2 MG/ML IV SOLN: 2.0000 mg | INTRAVENOUS | Status: DC | PRN

## 2022-06-27 MED ORDER — SODIUM CHLORIDE 0.9 % IV SOLN: INTRAVENOUS | Status: DC

## 2022-06-27 MED ORDER — SODIUM CHLORIDE 0.9 % IV BOLUS
500.0000 mL | Freq: Once | INTRAVENOUS | Status: AC
  Administered 2022-06-27: 500 mL via INTRAVENOUS

## 2022-06-27 NOTE — Assessment & Plan Note (Signed)
Continue statin, Xarelto

## 2022-06-27 NOTE — Assessment & Plan Note (Signed)
Resume Xarelto 

## 2022-06-27 NOTE — Evaluation (Signed)
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 Pt reported of 8/10 pain in RUE, edema in hand noted and pt unable to use his RUE. PLOF Ind Ambulator at household level using SPC and driving as per pt. Pt has family who helps with meals. Today's assessment revealed generalized weakness with severe drop in BP with change in position BP 134/82 in sitting, 105/66 HR 98 in standing with c/o dizziness, 128/88 HR 95 in chair with legs up. PT/OT communicated with Nurse and MD regarding the findings. As per MD, there is a upper lobe Lung mass compressing the Clavicle causing the RUE problems. Pt needed sup for bed mobility and transfers with min guard of 2 to recliner with persistent pain and mild dizziness. PT will continue in acute care and will benefit from SNF to improve  generalized conditioning and RUE compensatory interventions and for cognitive safety reasons.      Recommendations for follow up therapy are one component of a multi-disciplinary discharge planning process, led by the attending physician.  Recommendations may be updated based on patient status, additional functional criteria and insurance authorization.  Follow Up Recommendations Skilled nursing-short term rehab (<3 hours/day) Can patient physically be transported by private vehicle: No    Assistance Recommended at Discharge Intermittent Supervision/Assistance  Patient can return home with the following  A  little help with walking and/or transfers;A lot of help with bathing/dressing/bathroom;Assistance with cooking/housework;Assistance with feeding;Direct supervision/assist for medications management;Assist for transportation    Equipment Recommendations None recommended by PT  Recommendations for Other Services       Functional Status Assessment Patient has had a recent decline in their functional status and demonstrates the ability to make significant improvements in function in a reasonable and predictable amount of time.     Precautions / Restrictions Precautions Precautions: Fall Restrictions Weight Bearing Restrictions: No      Mobility  Bed Mobility Overal bed mobility: Needs Assistance Bed Mobility: Supine to Sit     Supine to sit: Supervision          Transfers Overall transfer level: Needs assistance Equipment used: Rolling walker (2 wheels) Transfers: Sit to/from Stand, Bed to chair/wheelchair/BSC Sit to Stand: Min guard   Step pivot transfers: Min guard       General transfer comment: with dizziness.    Ambulation/Gait: Unsafe                General Gait Details: usafe 2/2 to OH  Stairs: N/A            Wheelchair Mobility    Modified Rankin (Stroke Patients Only)       Balance Overall balance assessment: Needs assistance Sitting-balance support: Feet supported Sitting balance-Leahy Scale: Good     Standing balance support: Bilateral upper extremity supported Standing balance-Leahy Scale: Fair Standing balance comment: needs assitance 2/2 to RUE weak and OH with standing  Pertinent Vitals/Pain Pain Assessment Pain Assessment: 0-10 Pain Score: 8  Pain Location: RUE Pain Descriptors / Indicators: Grimacing, Guarding Pain Intervention(s): Limited activity within patient's tolerance    Home Living Family/patient expects to be discharged to:: Private residence Living Arrangements:  Alone Available Help at Discharge: Available PRN/intermittently;Family Type of Home: Apartment Home Access: Level entry       Home Layout: One level Home Equipment: Cane - single point;BSC/3in1      Prior Function Prior Level of Function : Independent/Modified Independent             Mobility Comments: amb with SPC at household level and was drivins as pe pt. ADLs Comments: pt reports generally MOD I-I in ADL and that his significant other has "left him"; chart review from cancer center/palliative note 2/8 states family reporst pt has been requiring increased assist for all ADL/IADL and may be considering placement     Hand Dominance   Dominant Hand: Right    Extremity/Trunk Assessment   Upper Extremity Assessment Upper Extremity Assessment: Defer to OT evaluation RUE Deficits / Details: pt cannot tolerate any A/PROM to RUE, protective throughout; RUE: Unable to fully assess due to pain    Lower Extremity Assessment Lower Extremity Assessment: Generalized weakness       Communication   Communication: HOH  Cognition Arousal/Alertness: Lethargic Behavior During Therapy: Flat affect Overall Cognitive Status: No family/caregiver present to determine baseline cognitive functioning Area of Impairment: Orientation, Attention, Memory, Following commands, Safety/judgement, Awareness, Problem solving                 Orientation Level: Disoriented to, Place, Time, Situation Current Attention Level: Focused Memory: Decreased short-term memory Following Commands: Follows one step commands with increased time Safety/Judgement: Decreased awareness of safety, Decreased awareness of deficits Awareness: Intellectual Problem Solving: Slow processing, Requires tactile cues, Requires verbal cues, Difficulty sequencing, Decreased initiation          General Comments General comments (skin integrity, edema, etc.): BP 134/82 in sitting, 105/66 HR 98 in standing with c/o  dizziness, 128/88 HR 95 in chair with legs up.    Exercises Other Exercises Other Exercises: Ankle PF/DF and heel siides every 2 to 3 hours   Assessment/Plan    PT Assessment Patient needs continued PT services  PT Problem List Decreased strength;Decreased range of motion;Decreased activity tolerance;Decreased balance;Decreased mobility;Decreased cognition;Decreased safety awareness;Decreased knowledge of precautions;Cardiopulmonary status limiting activity;Pain       PT Treatment Interventions Gait training;Functional mobility training;Therapeutic activities;Therapeutic exercise;Balance training;Neuromuscular re-education;Cognitive remediation;Patient/family education    PT Goals (Current goals can be found in the Care Plan section)  Acute Rehab PT Goals PT Goal Formulation: Patient unable to participate in goal setting Time For Goal Achievement: 07/11/22 Potential to Achieve Goals: Fair    Frequency Min 2X/week     Co-evaluation   Reason for Co-Treatment: For patient/therapist safety;To address functional/ADL transfers   OT goals addressed during session: Proper use of Adaptive equipment and DME;Strengthening/ROM       AM-PAC PT "6 Clicks" Mobility  Outcome Measure Help needed turning from your back to your side while in a flat bed without using bedrails?: None Help needed moving from lying on your back to sitting on the side of a flat bed without using bedrails?: None Help needed moving to and from a bed to a chair (including a wheelchair)?: A Little Help needed standing up from a chair using your arms (e.g., wheelchair or bedside chair)?: A Little Help needed to walk in  hospital room?: Total Help needed climbing 3-5 steps with a railing? : Total 6 Click Score: 16    End of Session Equipment Utilized During Treatment: Gait belt Activity Tolerance: Treatment limited secondary to medical complications (Comment) Patient left: in chair;with call bell/phone within  reach;with chair alarm set;with nursing/sitter in room Nurse Communication: Mobility status PT Visit Diagnosis: Unsteadiness on feet (R26.81);Other abnormalities of gait and mobility (R26.89);Difficulty in walking, not elsewhere classified (R26.2);Dizziness and giddiness (R42);Pain Pain - Right/Left: Right Pain - part of body: Arm    Time: 6283-1517 PT Time Calculation (min) (ACUTE ONLY): 21 min   Charges:   PT Evaluation $PT Eval Moderate Complexity: 1 Mod          Chara Marquard PT DPT 11:27 AM,06/27/22

## 2022-06-27 NOTE — Assessment & Plan Note (Addendum)
Hbg stable in 9's since admission.   Prior baseline Hbg 11.9. Likely due to malignancy / chronic disease.   No signs of any bleeding.   Recent iron studies 06/18/22 - iron low 35, normal TIBC 294, low sat ratio 12%, elevated ferritin 715. --monitor CBC --transfuse RBC's in Hbg < 7.0 Hbg improving 9.1 >> ... 9.7

## 2022-06-27 NOTE — Assessment & Plan Note (Addendum)
On Xarelto for A-fib and hx of DVT and PE Hold Xarelto for lung biopsy

## 2022-06-27 NOTE — Assessment & Plan Note (Signed)
No acute issues. Monitor.

## 2022-06-27 NOTE — TOC Initial Note (Signed)
Transition of Care [facility]) - Initial/Assessment Note    Patient Details  Name: [name] MRN: [id number] Date of Birth: [birth year]  Transition of Care [facility]) CM/SW Contact:    Valente David, RN Phone Number: 06/27/2022, 2:53 PM  Clinical Narrative:                   Patient admitted from home where he lives alone.  State friend usually helps with transportation to MD appointments.  Is seen at [facility] for primary care, uses [facility] for medications.  Has walker at home, denies other DME.  He is aware of recommendations for SNF for short term rehab and agrees.  Does not have preference.  FL2 completed and bed search initiated.   Expected Discharge Plan: Skilled Nursing Facility Barriers to Discharge: Continued Medical Work up   Patient Goals and CMS Choice Patient states their goals for this hospitalization and ongoing recovery are:: SNF for short term rehab CMS Medicare.gov Compare Post Acute Care list provided to:: Patient Choice offered to / list presented to : Patient      Expected Discharge Plan and Services     Post Acute Care Choice: [place] Living arrangements for the past 2 months: Apartment                                      Prior Living Arrangements/Services Living arrangements for the past 2 months: Apartment Lives with:: Self Patient language and need for interpreter reviewed:: Yes Do you feel safe going back to the place where you live?: Yes      Need for Family Participation in Patient Care: Yes (Comment) Care giver support system in place?: Yes (comment) Current home services: DME Criminal Activity/Legal Involvement Pertinent to Current Situation/Hospitalization: No - Comment as needed  Activities of Daily Living Home Assistive Devices/Equipment: Eyeglasses, Dentures (specify type) ADL Screening (condition at time of admission) Patient's cognitive ability adequate to safely complete daily  activities?: Yes Is the patient deaf or have difficulty hearing?: No Does the patient have difficulty seeing, even when wearing glasses/contacts?: Yes Does the patient have difficulty concentrating, remembering, or making decisions?: Yes Patient able to express need for assistance with ADLs?: Yes Does the patient have difficulty dressing or bathing?: Yes Independently performs ADLs?: No Does the patient have difficulty walking or climbing stairs?: Yes Weakness of Legs: Both Weakness of Arms/Hands: Both  Permission Sought/Granted Permission sought to share information with : Case Manager Permission granted to share information with : Yes, Verbal Permission Granted              Emotional Assessment   Attitude/Demeanor/Rapport: Engaged Affect (typically observed): Calm Orientation: : Oriented to Self, Oriented to Place, Oriented to  Time, Oriented to Situation   Psych Involvement: No (comment)  Admission diagnosis:  Weakness [R53.1] Generalized weakness [R53.1] Anemia, unspecified type [D64.9] Patient Active Problem List   Diagnosis Date Noted   Generalized weakness 06/26/2022   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[place]) 06/11/2022   Hypercalcemia 06/11/2022   Hyponatremia 06/11/2022   Dyslipidemia 06/11/2022   History of pulmonary embolism 06/11/2022   Pancoast tumor ([place]) 05/11/2022   Right shoulder pain 05/11/2022   Biceps tendon tear 05/11/2022   History of DVT (deep vein thrombosis) 05/11/2022   History of pulmonary embolus (PE) 05/11/2022   Chronic anticoagulation 05/11/2022   Heart failure with reduced ejection fraction ([place]) 05/11/2022  History of CVA with residual deficit 05/11/2022   Iron deficiency 05/11/2022   Chronic venous insufficiency 03/03/2022   Hyperlipidemia 03/03/2022   SOB (shortness of breath) 09/15/2021   Hypertensive urgency 09/15/2021   Pneumonia of right lower lobe due to infectious organism 08/09/2021   Elevated troponin 01/28/2020   UTI (urinary  tract infection) 01/28/2020   Hypokalemia 01/28/2020   Diarrhea 01/28/2020   Chest pain 01/28/2020   Chronic combined systolic and diastolic CHF (congestive heart failure) ([place]) 07/86/7544   Acute metabolic encephalopathy 92/05/69   Coagulation disorder ([place]) 11/29/2019   AKI (acute kidney injury) ([place])    History of cocaine abuse ([place])    Acute ischemic right PCA stroke ([place]) 09/13/2019   Vision loss, bilateral    Tobacco abuse    Bradycardia    Non-sustained ventricular tachycardia (HCC)    Orthostatic dizziness 09/12/2019   Nonintractable headache    Slurred speech    Mixed diabetic hyperlipidemia associated with type 2 diabetes mellitus (HCC)    Finger joint swelling, right 06/27/2019   Bilateral hand pain 06/14/2019   PAD (peripheral artery disease) ([place]) 05/20/2019   Encounter for long-term (current) use of high-risk medication 04/30/2019   Atrial fibrillation, chronic ([place]) 04/08/2019   Fever 04/08/2019   Severe sepsis ([place]) 04/08/2019   Abdominal pain 04/07/2019   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[place]) 04/07/2019   Polyarthralgia 03/05/2019   Thrombocytopenia ([place]) 03/05/2019   Pain due to onychomycosis of toenails of both feet 11/20/2018   Diabetic neuropathy ([place]) 11/20/2018   Influenza A 06/22/2018   Community acquired pneumonia 06/22/2018   Arthritis 05/12/2017   Varicose veins of leg with swelling, bilateral 01/18/2017   Facial twitching    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 ([place]) 02/24/2015   Pneumonia 10/12/2014   Osteoarthritis of right knee 02/11/2014   Pain and swelling of knee 01/08/2014   Pain in extremity 01/08/2014   Weakness due to cerebrovascular accident 01/08/2014   Pulmonary embolism ([place]) 06/19/2012   Gout 09/22/2011   CVA, old, speech/language deficit 09/01/2011   Cerebral infarction ([place]) 04/26/2011   Type 2 diabetes mellitus with diabetic polyneuropathy, without long-term current use of insulin ([place]) 04/26/2011    Accelerated hypertension 04/26/2011   Anemia 04/26/2011   DVT (deep venous thrombosis) ([place]) 04/26/2011   Essential hypertension 04/26/2011   PCP:  [facility]:   [facility]), [place] [address] [place] ([address]) [place] [address] Phone: [phone] Fax: [phone]     Social Determinants of Health (SDOH) Social History: SDOH Screenings   Food Insecurity: No Food Insecurity (06/26/2022)  Housing: Low Risk  (06/26/2022)  Transportation Needs: No Transportation Needs (06/26/2022)  Utilities: Not At Risk (06/26/2022)  Alcohol Screen: Low Risk  (06/21/2022)  Depression (PHQ2-9): Medium Risk (06/18/2022)  Financial Resource Strain: Low Risk  (06/21/2022)  Physical Activity: Inactive (06/21/2022)  Social Connections: Socially Isolated (06/21/2022)  Stress: Stress Concern Present (06/21/2022)  Tobacco Use: High Risk (06/26/2022)   SDOH Interventions:     Readmission Risk Interventions    01/29/2020   12:41 PM  Readmission Risk Prevention Plan  Transportation Screening Complete  Palliative Care Screening Not Applicable  Medication Review (RN Care Manager) Complete

## 2022-06-27 NOTE — NC FL2 (Signed)
[place] LEVEL OF CARE FORM     IDENTIFICATION  Patient Name: Macarius Ruark Birthdate: 1951/02/28 Sex: male Admission Date (Current Location): 06/25/2022  [facility] and [place] Number:  Engineering geologist and Address:  [facility], [address] [place], [place] [address]      Provider Number: [id number]  Attending Physician Name and Address:  Ezekiel Slocumb, DO  Relative Name and Phone Number:  Farrel Gobble - [phone]    Current Level of Care: Hospital Recommended Level of Care: Nursing Facility Prior Approval Number:    Date Approved/Denied:   PASRR Number: [id number]  Discharge Plan: SNF    Current Diagnoses: Patient Active Problem List   Diagnosis Date Noted   Generalized weakness 06/26/2022   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[place]) 06/11/2022   Hypercalcemia 06/11/2022   Hyponatremia 06/11/2022   Dyslipidemia 06/11/2022   History of pulmonary embolism 06/11/2022   Pancoast tumor ([place]) 05/11/2022   Right shoulder pain 05/11/2022   Biceps tendon tear 05/11/2022   History of DVT (deep vein thrombosis) 05/11/2022   History of pulmonary embolus (PE) 05/11/2022   Chronic anticoagulation 05/11/2022   Heart failure with reduced ejection fraction ([place]) 05/11/2022   History of CVA with residual deficit 05/11/2022   Iron deficiency 05/11/2022   Chronic venous insufficiency 03/03/2022   Hyperlipidemia 03/03/2022   SOB (shortness of breath) 09/15/2021   Hypertensive urgency 09/15/2021   Pneumonia of right lower lobe due to infectious organism 08/09/2021   Elevated troponin 01/28/2020   UTI (urinary tract infection) 01/28/2020   Hypokalemia 01/28/2020   Diarrhea 01/28/2020   Chest pain 01/28/2020   Chronic combined systolic and diastolic CHF (congestive heart failure) ([place]) 24/40/1027   Acute metabolic encephalopathy 25/36/6440   Coagulation disorder ([place]) 11/29/2019   AKI (acute kidney injury) ([place])    History of  cocaine abuse ([place])    Acute ischemic right PCA stroke ([place]) 09/13/2019   Vision loss, bilateral    Tobacco abuse    Bradycardia    Non-sustained ventricular tachycardia (HCC)    Orthostatic dizziness 09/12/2019   Nonintractable headache    Slurred speech    Mixed diabetic hyperlipidemia associated with type 2 diabetes mellitus ([place])    Finger joint swelling, right 06/27/2019   Bilateral hand pain 06/14/2019   PAD (peripheral artery disease) ([place]) 05/20/2019   Encounter for long-term (current) use of high-risk medication 04/30/2019   Atrial fibrillation, chronic ([place]) 04/08/2019   Fever 04/08/2019   Severe sepsis ([place]) 04/08/2019   Abdominal pain 04/07/2019   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[place]) 04/07/2019   Polyarthralgia 03/05/2019   Thrombocytopenia ([place]) 03/05/2019   Pain due to onychomycosis of toenails of both feet 11/20/2018   Diabetic neuropathy ([place]) 11/20/2018   Influenza A 06/22/2018   Community acquired pneumonia 06/22/2018   Arthritis 05/12/2017   Varicose veins of leg with swelling, bilateral 01/18/2017   Facial twitching    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 ([place]) 02/24/2015   Pneumonia 10/12/2014   Osteoarthritis of right knee 02/11/2014   Pain and swelling of knee 01/08/2014   Pain in extremity 01/08/2014   Weakness due to cerebrovascular accident 01/08/2014   Pulmonary embolism ([place]) 06/19/2012   Gout 09/22/2011   CVA, old, speech/language deficit 09/01/2011   Cerebral infarction ([place]) 04/26/2011   Type 2 diabetes mellitus with diabetic polyneuropathy, without long-term current use of insulin ([place]) 04/26/2011   Accelerated hypertension 04/26/2011   Anemia 04/26/2011   DVT (  deep venous thrombosis) ([place]) 04/26/2011   Essential hypertension 04/26/2011    Orientation RESPIRATION BLADDER Height & Weight     Self, Time, Situation, Place  Normal Continent Weight: 81.6 kg Height:  6\' 2"  (188 cm)  BEHAVIORAL SYMPTOMS/MOOD NEUROLOGICAL BOWEL  NUTRITION STATUS      Continent Diet  AMBULATORY STATUS COMMUNICATION OF NEEDS Skin   Limited Assist Verbally Normal                       Personal Care Assistance Level of Assistance  Bathing, Dressing Bathing Assistance: Limited assistance   Dressing Assistance: Limited assistance     Functional Limitations Info             SPECIAL CARE FACTORS FREQUENCY  PT (By licensed PT), OT (By licensed OT)     PT Frequency: 5 times a week OT Frequency: 5 times a week            Contractures Contractures Info: Not present    Additional Factors Info  Code Status, Allergies Code Status Info: Full Allergies Info: NKA           Current Medications (06/27/2022):  This is the current hospital active medication list Current Facility-Administered Medications  Medication Dose Route Frequency Provider Last Rate Last Admin   acetaminophen (TYLENOL) tablet 650 mg  650 mg Oral Q6H PRN Mansy, Jan A, MD       Or   acetaminophen (TYLENOL) suppository 650 mg  650 mg Rectal Q6H PRN Mansy, Jan A, MD       albuterol (PROVENTIL) (2.5 MG/3ML) 0.083% nebulizer solution 3 mL  3 mL Inhalation Q6H PRN Nicole Kindred A, DO       allopurinol (ZYLOPRIM) tablet 100 mg  100 mg Oral Daily Nicole Kindred A, DO   100 mg at 06/27/22 1007   bisacodyl (DULCOLAX) suppository 10 mg  10 mg Rectal Daily PRN Nicole Kindred A, DO       docusate sodium (COLACE) capsule 100 mg  100 mg Oral BID Nicole Kindred A, DO   100 mg at 06/27/22 1008   gabapentin (NEURONTIN) capsule 400 mg  400 mg Oral TID Nicole Kindred A, DO   400 mg at 06/27/22 1008   latanoprost (XALATAN) 0.005 % ophthalmic solution 1 drop  1 drop Both Eyes QHS Nicole Kindred A, DO       magnesium hydroxide (MILK OF MAGNESIA) suspension 30 mL  30 mL Oral Daily PRN Mansy, Jan A, MD   30 mL at 06/27/22 0835   metoprolol succinate (TOPROL-XL) 24 hr tablet 12.5 mg  12.5 mg Oral Daily Nicole Kindred A, DO   12.5 mg at 06/27/22 1008   morphine (MS  CONTIN) 12 hr tablet 30 mg  30 mg Oral Q12H Nicole Kindred A, DO   30 mg at 06/27/22 1007   morphine (PF) 2 MG/ML injection 2-4 mg  2-4 mg Intravenous Q4H PRN Nicole Kindred A, DO       ondansetron (ZOFRAN) tablet 4 mg  4 mg Oral Q6H PRN Mansy, Jan A, MD       Or   ondansetron [facility]) injection 4 mg  4 mg Intravenous Q6H PRN Mansy, Jan A, MD       oxyCODONE (Oxy IR/ROXICODONE) immediate release tablet 10-15 mg  10-15 mg Oral Q4H PRN Nicole Kindred A, DO   15 mg at 06/27/22 0835   predniSONE (DELTASONE) tablet 10 mg  10 mg Oral Q breakfast Ezekiel Slocumb,  DO   10 mg at 06/27/22 4599   Rivaroxaban (XARELTO) tablet 15 mg  15 mg Oral Q supper Nicole Kindred A, DO   15 mg at 06/26/22 1751   senna-docusate (Senokot-S) tablet 1 tablet  1 tablet Oral BID Nicole Kindred A, DO   1 tablet at 06/27/22 1008   simvastatin (ZOCOR) tablet 20 mg  20 mg Oral QHS Nicole Kindred A, DO   20 mg at 06/26/22 2148   [START ON 06/28/2022] sulfamethoxazole-trimethoprim (BACTRIM) 400-80 MG per tablet 1 tablet  1 tablet Oral Once per day on Mon Wed Fri Nicole Kindred A, DO       traZODone (DESYREL) tablet 25 mg  25 mg Oral QHS PRN Mansy, Arvella Merles, MD         Discharge Medications: Please see discharge summary for a list of discharge medications.  Relevant Imaging Results:  Relevant Lab Results:   Additional Information ss [id number]  Valente David, RN

## 2022-06-27 NOTE — Plan of Care (Signed)
  Problem: Pain Managment: Goal: General experience of comfort will improve Outcome: Progressing   Problem: Safety: Goal: Ability to remain free from injury will improve Outcome: Progressing   Problem: Elimination: Goal: Will not experience complications related to bowel motility Outcome: Progressing   Problem: Nutrition: Goal: Adequate nutrition will be maintained Outcome: Progressing   Problem: Activity: Goal: Risk for activity intolerance will decrease Outcome: Progressing

## 2022-06-27 NOTE — Assessment & Plan Note (Signed)
Appears dry on exam. Gentle fluids for orthostatic dizziness. Monitor volume status closely.

## 2022-06-27 NOTE — Evaluation (Addendum)
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 Co tx completed with PT on this date. Pt is oriented to self and place, not oriented to date or to situation. Poor awareness and processing throughout. Pt is a poor historian. PTA pt reports he lives by himself, has assist from his sister but is MOD I-I in ADL/IADL; per chart pt has been requiring increased assist for ADL/IADL. Will need to clarify. Pt presents with deficits in strength, endurance, balance, cognition and RUE function affecting safe and optimal ADL completion. Pt reports significant pain throughout RUE, unable to tolerate P/AROM, which is his dominant arm. Assist required for feeding/grooming with non dominant hand. Pt performs bed mobility with supervision, STS with CGA with RW, step pivot to bedside chair with CGA however pt reports dizziness with position changes, unsafe to further attempt mobility. See below for BP readings. At this time recommend discharge to STR to address functional deficits. OT will continue to follow acutely.      Recommendations for follow up therapy are one component of a multi-disciplinary discharge planning process, led by the attending physician.  Recommendations may be updated based on patient status, additional functional criteria and insurance authorization.   Follow Up Recommendations  Skilled nursing-short term rehab (<3 hours/day)     Assistance  Recommended at Discharge Frequent or constant Supervision/Assistance  Patient can return home with the following A little help with walking and/or transfers;A lot of help with bathing/dressing/bathroom;Assistance with cooking/housework;Direct supervision/assist for financial management;Direct supervision/assist for medications management;Help with stairs or ramp for entrance    Functional Status Assessment  Patient has had a recent decline in their functional status and demonstrates the ability to make significant improvements in function in a reasonable and predictable amount of time.  Equipment Recommendations  Other (comment) (per next venue of care)    Recommendations for Other Services       Precautions / Restrictions Precautions Precautions: Fall Restrictions Weight Bearing Restrictions: No      Mobility Bed Mobility Overal bed mobility: Needs Assistance Bed Mobility: Supine to Sit     Supine to sit: Supervision          Transfers Overall transfer level: Needs assistance Equipment used: Rolling walker (2 wheels) Transfers: Sit to/from Stand Sit to Stand: Min guard                  Balance Overall balance assessment: Needs assistance Sitting-balance support: Feet supported Sitting balance-Leahy Scale: Good     Standing balance support: Bilateral upper extremity supported, During functional activity, Reliant on assistive device for balance Standing balance-Leahy Scale: Fair                             ADL either performed or assessed with clinical judgement   ADL Overall ADL's : Needs assistance/impaired Eating/Feeding: Minimal assistance;Sitting Eating/Feeding Details (indicate cue type and reason): with non dominant L Hand Grooming: Wash/dry face;Set up;Sitting Grooming Details (indicate cue type  and reason): with non dominant L hand             Lower Body Dressing: Maximal assistance   Toilet Transfer: Min guard;Minimal  assistance;Stand-pivot;Rolling walker (2 wheels) Toilet Transfer Details (indicate cue type and reason): step pivot                 Vision Patient Visual Report: No change from baseline       Perception     Praxis      Pertinent Vitals/Pain Pain Assessment Pain Assessment: Faces Faces Pain Scale: Hurts whole lot Pain Location: RUE Pain Descriptors / Indicators: Grimacing, Guarding Pain Intervention(s): Limited activity within patient's tolerance, Premedicated before session, Repositioned, Monitored during session (RN In room post session to assist)     Hand Dominance Right   Extremity/Trunk Assessment Upper Extremity Assessment Upper Extremity Assessment: RUE deficits/detail;Generalized weakness RUE Deficits / Details: pt cannot tolerate any A/PROM to RUE, protective throughout; RUE: Unable to fully assess due to pain   Lower Extremity Assessment Lower Extremity Assessment: Generalized weakness       Communication Communication Communication: HOH   Cognition Arousal/Alertness: Lethargic Behavior During Therapy: Flat affect Overall Cognitive Status: No family/caregiver present to determine baseline cognitive functioning Area of Impairment: Orientation, Attention, Memory, Following commands, Safety/judgement, Awareness, Problem solving                 Orientation Level: Disoriented to, Place, Time, Situation Current Attention Level: Focused Memory: Decreased short-term memory Following Commands: Follows one step commands with increased time Safety/Judgement: Decreased awareness of safety, Decreased awareness of deficits Awareness: Intellectual Problem Solving: Slow processing, Requires tactile cues, Requires verbal cues, Difficulty sequencing, Decreased initiation       General Comments  BP 134/82 in sitting, 105/66 HR 98 in standing with c/o dizziness, 128/88 HR 95 in chair with legs up.    Exercises Other Exercises Other Exercises: edu re: role of  OT, role of rehab, discharge recommendations, home safety, falls prevention   Shoulder Instructions      Home Living Family/patient expects to be discharged to:: Private residence Living Arrangements: Alone Available Help at Discharge: Available PRN/intermittently;Family Type of Home: Apartment Home Access: Level entry     Home Layout: One level     Bathroom Shower/Tub: Teacher, early years/pre: Standard Bathroom Accessibility: Yes   Home Equipment: Cane - single point;BSC/3in1          Prior Functioning/Environment Prior Level of Function : Independent/Modified Independent             Mobility Comments: amb with SPC ADLs Comments: pt report generally MOD I-I in ADL and that his significant other has "left him"; chart review from cancer center/palliative note 2/8 states family reporst pt has been requiring increased assist for all ADL/IADL and may be considering placement        OT Problem List: Decreased strength;Decreased activity tolerance;Impaired balance (sitting and/or standing);Decreased cognition;Decreased knowledge of use of DME or AE;Decreased knowledge of precautions      OT Treatment/Interventions: Self-care/ADL training;Therapeutic exercise;Patient/family education;Energy conservation;Therapeutic activities;DME and/or AE instruction;Balance training    OT Goals(Current goals can be found in the care plan section) Acute Rehab OT Goals Patient Stated Goal: go home OT Goal Formulation: With patient Time For Goal Achievement: 07/11/22 Potential to Achieve Goals: Good ADL Goals Pt Will Perform Grooming: with modified independence;sitting Pt Will Perform Lower Body Dressing: sit to/from stand;with modified independence Pt Will Transfer to Toilet: with modified independence Pt Will Perform Toileting -  Clothing Manipulation and hygiene: with modified independence;sit to/from stand  OT Frequency: Min 2X/week    Co-evaluation PT/OT/SLP  Co-Evaluation/Treatment: Yes Reason for Co-Treatment: Necessary to address cognition/behavior during functional activity;For patient/therapist safety   OT goals addressed during session: ADL's and self-care      AM-PAC OT "6 Clicks" Daily Activity     Outcome Measure Help from another person eating meals?: A Little Help from another person taking care of personal grooming?: A Little Help from another person toileting, which includes using toliet, bedpan, or urinal?: A Lot Help from another person bathing (including washing, rinsing, drying)?: A Lot Help from another person to put on and taking off regular upper body clothing?: A Little Help from another person to put on and taking off regular lower body clothing?: A Lot 6 Click Score: 15   End of Session Equipment Utilized During Treatment: Rolling walker (2 wheels) Nurse Communication: Mobility status  Activity Tolerance: Patient tolerated treatment well Patient left: in chair;with call bell/phone within reach;with bed alarm set;with nursing/sitter in room  OT Visit Diagnosis: Unsteadiness on feet (R26.81);Muscle weakness (generalized) (M62.81);Other abnormalities of gait and mobility (R26.89)                Time: 4492-0100 OT Time Calculation (min): 25 min Charges:  OT General Charges $OT Visit: 1 Visit OT Evaluation $OT Eval Moderate Complexity: 1 Mod  Shanon Payor, OTD OTR/L  06/27/22, 10:54 AM

## 2022-06-27 NOTE — Assessment & Plan Note (Signed)
Working with PT today (2/11), pt BP sitting 134/82 >> 105/66 with standing and pt c/o dizziness and could not tolerate standing.  BP improved once seated. --will give small fluid bolus followed by maintenance IV fluids --suspect he's hypovolemic --check orthostatic vitals daily --fall precautions --PT/OT recommending SNF/rehab

## 2022-06-27 NOTE — Plan of Care (Signed)

## 2022-06-27 NOTE — Assessment & Plan Note (Signed)
Resume Xarelto now as the biopsy has been performed

## 2022-06-27 NOTE — Progress Notes (Signed)
Progress Note   Patient: [name] ZOX:[id number] DOB: [birth year] DOA: 06/25/2022     0 DOS: the patient was seen and examined on 06/27/2022   Brief hospital course: Carry Ortez is a 72 y.o. 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  No cough or wheezing or dyspnea.   ED evaluation revealed hyponatremia, hypochloremia, stable renal function with Cr 1.31, anemia with Hbg 9.1 (prior 11.9).   CXR showed right apical mass with destructive changes on the adjacent right 1st and 2nd ribs, stable from prior imaging.  Of note, patient follow with Dr. Janese Banks, oncology, and had undergone a PET scan on 2/9 the day before admission.  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  There is an outpatient CT-guided biopsy scheduled for 2/14 with IR.  2/10: PT/OT recommending SNF 2/11: orthostatic dizziness, not tolerating standing today, giving IV fluids   Assessment and Plan: * Generalized weakness Likely due to progressive malignancy and symptomatic anemia. - Negative hemoccult and no blood in stools - PT/OT recommend SNF --TOC following for placement  Orthostatic dizziness Working with PT today (2/11), pt BP sitting 134/82 >> 105/66 with standing and pt c/o dizziness and could not tolerate standing.  BP improved once seated. --will give small fluid bolus followed by maintenance IV fluids --suspect he's hypovolemic --check orthostatic vitals daily --fall  precautions --PT/OT recommending SNF/rehab  Chronic combined systolic and diastolic CHF (congestive heart failure) (HCC) Appears dry on exam. Gentle fluids for orthostatic dizziness. Monitor volume status closely.  Chronic anticoagulation On Xarelto for A-fib and hx of DVT and PE  History of pulmonary embolus (PE) On Xarelto  History of DVT (deep vein thrombosis) On Xarelto  Hyponatremia Na 131 on admission >> 136 >> 132 Giving IV fluids today due to orthostatic dizziness, suspect hypovolemic. Monitor BMP  Gout - Continue allopurinol.  Anemia Hbg stable in 9's since admission.   Prior baseline Hbg 11.9. Likely due to malignancy / chronic disease.   No signs of any bleeding.   Recent iron studies 06/18/22 - iron low 35, normal TIBC 294, low sat ratio 12%, elevated ferritin 715. --monitor CBC --transfuse RBC's in Hbg < 7.0 --will discuss w/ patient re iron infusion    Type 2 diabetes mellitus with diabetic polyneuropathy, without long-term current use of insulin ([place]) - The patient will be placed on supplemental coverage with NovoLog. - We will continue Neurontin.  Atrial fibrillation, chronic (HCC) Continue Xarelto  Essential hypertension Continue home regimen   Dyslipidemia Continue statin   Chronic venous insufficiency No acute issues. Monitor.  CVA, old, speech/language deficit Continue statin, Xarelto        Subjective: Pt up in recliner when seen this AM.  He reports ongoing pain in his right shoulder.  His speech is difficult to understand, but he was reporting issues with his meds at home not being given in a timely fashion and pain got severely uncontrolled.  Today he appears more comfortable, still reports significant pain.    Per PT, he did not tolerating standing to attempt ambulation, due to dizziness.  BP was noted to drop when he got up.   Physical Exam: Vitals:   06/26/22 0245 06/26/22 0811 06/26/22 2321 06/27/22 1009  BP: (!) 138/90  138/87 124/72 126/88  Pulse: 77 (!) 56 67 84  Resp: 18 15 17    Temp: 98.8 F (37.1 C) 97.8 F (36.6 C) 99 F (37.2 C)   TempSrc:      SpO2: 100%  99% 95%  Weight:      Height:       General exam: awake, alert, no acute distress HEENT: moist mucus membranes, hearing grossly normal  Respiratory system: coarse right sided crackles, no wheezes, rales or rhonchi, normal respiratory effort. Cardiovascular system: normal S1/S2, RRR, no pedal edema.   Gastrointestinal system: soft, NT, ND Central nervous system: A&O x self and hospital at least. no gross focal neurologic deficits, dysarthric speech with mild asphasia at times Extremities: moves all, no edema, normal tone Skin: dry, intact, normal temperature Psychiatry: normal mood, congruent affect, judgement and insight appear normal   Data Reviewed:  Notable labs --- Na 132 from 136, glucose 103, last Hbg stable 9.3  Family Communication: None at bedside, will attempt to call  Disposition: Status is: Observation The patient remains OBS appropriate and will d/c before 2 midnights.  Needs SNF/rehab placement. On IV fluids for orthostatic dizziness - he is unable to tolerating standing today   Planned Discharge Destination: SNF    Time spent: 42 minutes  Author: Ezekiel Slocumb, DO 06/27/2022 2:59 PM  For on call review [email or web address].

## 2022-06-27 NOTE — Hospital Course (Signed)
Orrie Schubert is a 72 y.o. 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  No cough or wheezing or dyspnea.   ED evaluation revealed hyponatremia, hypochloremia, stable renal function with Cr 1.31, anemia with Hbg 9.1 (prior 11.9).   CXR showed right apical mass with destructive changes on the adjacent right 1st and 2nd ribs, stable from prior imaging.  Of note, patient follow with Dr. Janese Banks, oncology, and had undergone a PET scan on 2/9 the day before admission.  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  There is an outpatient CT-guided biopsy scheduled for 2/14 with IR.  2/10: PT/OT recommending SNF 2/11: orthostatic dizziness, not tolerating standing today, giving IV fluids  2/12>>13: staging MRI's, seen by oncology and palliative care, titrating pain medications 2/14: worsening hyponatremia, resuming IV fluids

## 2022-06-28 ENCOUNTER — Observation Stay: Payer: Medicare Other

## 2022-06-28 DIAGNOSIS — R531 Weakness: Secondary | ICD-10-CM

## 2022-06-28 DIAGNOSIS — Z515 Encounter for palliative care: Secondary | ICD-10-CM

## 2022-06-28 LAB — PROTIME-INR
INR: 1.3 — ABNORMAL HIGH (ref 0.8–1.2)
Prothrombin Time: 16.5 seconds — ABNORMAL HIGH (ref 11.4–15.2)

## 2022-06-28 LAB — BASIC METABOLIC PANEL
Anion gap: 7 (ref 5–15)
BUN: 18 mg/dL (ref 8–23)
CO2: 28 mmol/L (ref 22–32)
Calcium: 9.9 mg/dL (ref 8.9–10.3)
Chloride: 97 mmol/L — ABNORMAL LOW (ref 98–111)
Creatinine, Ser: 0.96 mg/dL (ref 0.61–1.24)
GFR, Estimated: >60 mL/min (ref >60)
Glucose, Bld: 106 mg/dL — ABNORMAL HIGH (ref 70–99)
Potassium: 4 mmol/L (ref 3.5–5.1)
Sodium: 132 mmol/L — ABNORMAL LOW (ref 135–145)

## 2022-06-28 LAB — HEMOGLOBIN AND HEMATOCRIT, BLOOD
HCT: 30.8 % — ABNORMAL LOW (ref 39.0–52.0)
Hemoglobin: 9.5 g/dL — ABNORMAL LOW (ref 13.0–17.0)

## 2022-06-28 MED ORDER — MORPHINE SULFATE ER 15 MG PO TBCR
30.0000 mg | EXTENDED_RELEASE_TABLET | Freq: Three times a day (TID) | ORAL | Status: DC
  Administered 2022-06-28 – 2022-07-02 (×10): 30 mg via ORAL
  Filled 2022-06-28 (×11): qty 2

## 2022-06-28 MED ORDER — MORPHINE SULFATE (PF) 2 MG/ML IV SOLN
2.0000 mg | INTRAVENOUS | Status: DC | PRN
  Administered 2022-06-28: 2 mg via INTRAVENOUS
  Filled 2022-06-28: qty 1

## 2022-06-28 MED ORDER — GADOBUTROL 1 MMOL/ML IV SOLN
8.0000 mL | Freq: Once | INTRAVENOUS | Status: AC | PRN
  Administered 2022-06-28: 8 mL via INTRAVENOUS

## 2022-06-28 NOTE — Progress Notes (Signed)
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 He uses his non-dominate L hand for feeding and grooming tasks, he makes good effort to don socks using both hands but is ultimately unable to engage his R UE enough to thread socks onto feet. Trialed various positions for R UE in sitting and standing to reduce pain. Pt reports that performing pendulum is helpful, as is mild massage and limited stretching at shoulder. Pt is able to perform bed mobility, transfers, ambulation with RW, with SUPV-CGA and w/ increased time and effort for all movements. Pt sits in recliner but reports this increases his R UE pain and therefore elects to return to bed. Pt requests pain medication and heating pad, RN noted. Given pt's high level of pain, which is considerably impacting his ability to perform ADLs and fxl mobility, recommend palliative medicine consult.    Recommendations for follow up therapy are one component of a multi-disciplinary discharge planning process, led by the attending physician.  Recommendations may be updated based on patient status, additional functional criteria and insurance authorization.    Follow Up Recommendations  Skilled nursing-short term rehab (<3 hours/day)     Assistance Recommended at Discharge Frequent or constant  Supervision/Assistance  Patient can return home with the following  A little help with walking and/or transfers;A lot of help with bathing/dressing/bathroom;Assistance with cooking/housework;Direct supervision/assist for medications management;Help with stairs or ramp for entrance   Equipment Recommendations       Recommendations for Other Services Other (comment) (palliative medicine consult)    Precautions / Restrictions Precautions Precautions: Fall Restrictions Weight Bearing Restrictions: No       Mobility Bed Mobility Overal bed mobility: Needs Assistance Bed Mobility: Supine to Sit, Sit to Supine     Supine to sit: Supervision Sit to supine: Supervision   General bed mobility comments: Increased time and effort, with moaning and c/o pain    Transfers Overall transfer level: Needs assistance Equipment used: Rolling walker (2 wheels) Transfers: Sit to/from Stand Sit to Stand: Supervision     Step pivot transfers: Min guard     General transfer comment: Pt is able to use RUE on RW     Balance Overall balance assessment: Needs assistance Sitting-balance support: Feet supported Sitting balance-Leahy Scale: Good     Standing balance support: Bilateral upper extremity supported Standing balance-Leahy Scale: Fair                             ADL either performed or assessed with clinical judgement   ADL Overall ADL's : Needs assistance/impaired Eating/Feeding: Supervision/ safety;Set up;Sitting Eating/Feeding Details (indicate cue type and reason): Able to feed self with increased time and effort, using non-dominant L Hand                 Lower Body Dressing: Moderate assistance Lower  Body Dressing Details (indicate cue type and reason): Attempts to don socks INDly but ultimately requires assistance for threading onto foot, 2/2 limited dexterity in R hand.                    Extremity/Trunk Assessment Upper Extremity  Assessment Upper Extremity Assessment: RUE deficits/detail RUE: Shoulder pain at rest;Shoulder pain with ROM RUE Coordination: decreased fine motor;decreased gross motor   Lower Extremity Assessment Lower Extremity Assessment: Generalized weakness        Vision       Perception     Praxis      Cognition Arousal/Alertness: Awake/alert Behavior During Therapy: WFL for tasks assessed/performed Overall Cognitive Status: Within Functional Limits for tasks assessed                         Following Commands: Follows one step commands with increased time     Problem Solving: Slow processing          Exercises Other Exercises Other Exercises: Educ re: stretches, positioning to address R UE discomfort    Shoulder Instructions       General Comments      Pertinent Vitals/ Pain       Pain Assessment Pain Assessment: 0-10 Pain Score: 9  Pain Location: RUE, middle back Pain Descriptors / Indicators: Grimacing, Guarding Pain Intervention(s): Repositioned, Limited activity within patient's tolerance, Patient requesting pain meds-RN notified, Relaxation  Home Living                                          Prior Functioning/Environment              Frequency  Min 2X/week        Progress Toward Goals  OT Goals(current goals can now be found in the care plan section)  Progress towards OT goals: Progressing toward goals  Acute Rehab OT Goals OT Goal Formulation: With patient Time For Goal Achievement: 07/11/22 Potential to Achieve Goals: Good  Plan Discharge plan remains appropriate;Frequency remains appropriate    Co-evaluation                 AM-PAC OT "6 Clicks" Daily Activity     Outcome Measure   Help from another person eating meals?: A Little Help from another person taking care of personal grooming?: A Little Help from another person toileting, which includes using toliet, bedpan, or urinal?: A Lot Help from  another person bathing (including washing, rinsing, drying)?: A Lot Help from another person to put on and taking off regular upper body clothing?: A Little Help from another person to put on and taking off regular lower body clothing?: A Lot 6 Click Score: 15    End of Session Equipment Utilized During Treatment: Rolling walker (2 wheels)  OT Visit Diagnosis: Unsteadiness on feet (R26.81);Muscle weakness (generalized) (M62.81);Other abnormalities of gait and mobility (R26.89)   Activity Tolerance Patient tolerated treatment well   Patient Left in bed;with call bell/phone within reach;with bed alarm set   Nurse Communication          Time: 7106-2694 OT Time Calculation (min): 22 min  Charges: OT General Charges $OT Visit: 1 Visit OT Treatments $Self Care/Home Management : 8-22 mins Josiah Lobo, PhD, MS, OTR/L 06/28/22, 11:55 AM

## 2022-06-28 NOTE — Consult Note (Signed)
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  --   --   --   --   --  1.3*  APTT  --    < > 99* 97* 129* 112*  --    < > = values in this interval not displayed.    BMP: Recent Labs    06/25/22 1613 06/26/22 0448 06/27/22 0433 06/28/22 0501  NA 131* 136 132* 132*  K 3.5 3.7 4.1 4.0  CL 94* 100 98 97*  CO2 28 27 26  28  GLUCOSE 117* 104* 103* 106*  BUN 29* 26* 18 18  CALCIUM 9.8 9.8 10.3 9.9  CREATININE 1.30* 1.02 0.85 0.96  GFRNONAA 58* >60 >60 >60    LIVER FUNCTION TESTS: Recent Labs    01/31/22 0813 05/10/22 2029 05/14/22 0453 06/18/22 1033  BILITOT 0.7 0.8 0.5 0.4  AST 22 23 18 17   ALT 11 20 14 11   ALKPHOS 103 68 57 53  PROT 8.1 8.2* 6.9 8.3*  ALBUMIN 4.4 4.3 3.4* 3.7    TUMOR MARKERS: No results for input(s): "AFPTM", "CEA", "CA199", "CHROMGRNA" in the last 8760  hours.  Assessment and Plan:  [name] is a 72 yo male with concern for invasive bronchogenic carcinoma. He has a known right apical lung mass and was originally scheduled to have a right lung biopsy on 06/30/22 which was approved by Dr Anselm Pancoast. Request received to perform biopsy as an inpatient.   -NPO at 0001 of 06/30/22 -2-day hold of Xarelto ordered -PT/INR and CBC w diff ordered for morning of 06/30/22.  Risks and benefits of CT guided right lung mass biopsy was discussed with the patient including, but not limited to bleeding, hemoptysis, respiratory failure requiring intubation, infection, pneumothorax requiring chest tube placement, stroke from air embolism or even death.  All of the patient's questions were answered and the patient is agreeable to proceed.  Consent signed and in chart.   Thank you for this interesting consult.  I greatly enjoyed meeting Cassady Turano and look forward to participating in their care.  A copy of this report was sent to the requesting provider on this date.  Electronically Signed: Lura Em, PA-C 06/28/2022, 3:41 PM   I spent a total of 40 Minutes   in face to face in clinical consultation, greater than 50% of which was counseling/coordinating care for image-guided right lung biopsy.

## 2022-06-28 NOTE — Progress Notes (Signed)
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 Pt demonstrated ModI/Supervision for bed mobility with HOB raised and use of rail. Pt requires increased time due to significant R UE pain with limited active function. Pt able to advance ~47ft with MinA to bedside chair, anticipate pt could ambulate further, however R UE pain very limiting. Nursing/MD notified and pain meds to be adjusted. Will continue to progress as tolerated.   Recommendations for follow up therapy are one component of a multi-disciplinary discharge planning process, led by the attending physician.  Recommendations may be updated based on patient status, additional functional criteria and insurance authorization.  Follow Up Recommendations  Skilled nursing-short term rehab (<3 hours/day) Can patient physically be transported by private vehicle: Yes   Assistance Recommended at Discharge Intermittent Supervision/Assistance  Patient can return home with the following A little help with walking and/or transfers;Assistance with cooking/housework;Direct supervision/assist for medications management;Assist for transportation;Help with stairs or ramp for entrance   Equipment Recommendations  None recommended by PT (TBD at next facility, Pt has a SPC at home)    Recommendations for Other Services       Precautions / Restrictions Precautions Precautions: Fall Restrictions Weight Bearing Restrictions: No     Mobility  Bed  Mobility Overal bed mobility: Needs Assistance Bed Mobility: Supine to Sit, Sit to Supine     Supine to sit: Supervision Sit to supine: Supervision   General bed mobility comments: Increased time and effort, with moaning and c/o pain    Transfers Overall transfer level: Needs assistance Equipment used: Rolling walker (2 wheels) Transfers: Sit to/from Stand Sit to Stand: Supervision Stand pivot transfers: Min guard Step pivot transfers: Min guard            Ambulation/Gait Ambulation/Gait assistance: Herbalist (Feet): 5 Feet Assistive device: 1 person hand held assist Gait Pattern/deviations: Step-to pattern, Shuffle, Staggering left, Staggering right       General Gait Details:  (Short distance gait in room, no c/o dizziness.)   Stairs             Wheelchair Mobility    Modified Rankin (Stroke Patients Only)       Balance Overall balance assessment: Needs assistance Sitting-balance support: Feet supported Sitting balance-Leahy Scale: Good     Standing balance support: Bilateral upper extremity supported Standing balance-Leahy Scale: Fair Standing balance comment: needs assitance 2/2 to RUE pain and unsteadiness                            Cognition Arousal/Alertness: Awake/alert Behavior During Therapy: WFL for tasks assessed/performed Overall Cognitive Status: Within Functional Limits for tasks assessed                     Current Attention Level: Focused Memory: Decreased short-term memory Following Commands: Follows one step commands with increased time Safety/Judgement: Decreased awareness of safety, Decreased awareness of deficits Awareness: Intellectual  Exercises      General Comments General comments (skin integrity, edema, etc.): Pt informed by care team that he will stay in-house for planned R lung mass biopsy on 06/30/22 prior to transitioning to Rehab      Pertinent Vitals/Pain  Pain Assessment Pain Assessment: 0-10 Pain Score: 9  Pain Location: R UE Pain Descriptors / Indicators: Grimacing, Guarding, Stabbing Pain Intervention(s): Limited activity within patient's tolerance, Patient requesting pain meds-RN notified    Home Living                          Prior Function            PT Goals (current goals can now be found in the care plan section) Acute Rehab PT Goals Patient Stated Goal: less pain    Frequency    Min 2X/week      PT Plan      Co-evaluation              AM-PAC PT "6 Clicks" Mobility   Outcome Measure  Help needed turning from your back to your side while in a flat bed without using bedrails?: None Help needed moving from lying on your back to sitting on the side of a flat bed without using bedrails?: None Help needed moving to and from a bed to a chair (including a wheelchair)?: A Little Help needed standing up from a chair using your arms (e.g., wheelchair or bedside chair)?: A Little Help needed to walk in hospital room?: Total Help needed climbing 3-5 steps with a railing? : Total 6 Click Score: 16    End of Session Equipment Utilized During Treatment: Gait belt Activity Tolerance: Patient limited by pain Patient left: in bed;with call bell/phone within reach;with nursing/sitter in room Nurse Communication: Mobility status;Patient requests pain meds PT Visit Diagnosis: Unsteadiness on feet (R26.81);Other abnormalities of gait and mobility (R26.89);Difficulty in walking, not elsewhere classified (R26.2);Dizziness and giddiness (R42);Pain Pain - Right/Left: Right Pain - part of body: Arm     Time: 1530-1550 PT Time Calculation (min) (ACUTE ONLY): 20 min  Charges:  $Therapeutic Activity: 8-22 mins                    Mikel Cella, PTA   [name] 06/28/2022, 3:53 PM

## 2022-06-28 NOTE — TOC Progression Note (Signed)
Transition of Care [facility]) - Progression Note    Patient Details  Name: [name] MRN: [id number] Date of Birth: 28-Jan-1951  Transition of Care [facility] CM/SW [place], RN Phone Number: 06/28/2022, 11:01 AM  Clinical Narrative:    Resent out the bedsearch looking for a str snf bed, will review bed offers once obtained   Expected Discharge Plan: [place] Barriers to Discharge: Continued Medical Work up  Expected Discharge Plan and Services     Post Acute Care Choice: [place] Living arrangements for the past 2 months: Apartment                                       Social Determinants of Health (SDOH) Interventions SDOH Screenings   Food Insecurity: No Food Insecurity (06/26/2022)  Housing: Low Risk  (06/26/2022)  Transportation Needs: No Transportation Needs (06/26/2022)  Utilities: Not At Risk (06/26/2022)  Alcohol Screen: Low Risk  (06/21/2022)  Depression (PHQ2-9): Medium Risk (06/18/2022)  Financial Resource Strain: Low Risk  (06/21/2022)  Physical Activity: Inactive (06/21/2022)  Social Connections: Socially Isolated (06/21/2022)  Stress: Stress Concern Present (06/21/2022)  Tobacco Use: High Risk (06/26/2022)    Readmission Risk Interventions    01/29/2020   12:41 PM  Readmission Risk Prevention Plan  Transportation Screening Complete  Palliative Care Screening Not Applicable  Medication Review (RN Care Manager) Complete
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
## 2022-06-28 NOTE — Consult Note (Signed)
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 involving the first, second ribs as well as along the right side of the vertebral body of T1. The mass does extend into the supraclavicular soft tissues in the adjacent musculature and was better defined on the previous contrast study with a diameter of 5.1 x 3.2 cm. Attempted measurement today without the advantage of IV contrast when measured in the same fashion would be more extensive at 5.5 by 5.5 cm. Please correlate with any prior workup. Stable separate nodular area measuring 9 mm along the extreme right inferior costophrenic angle on series 3, image 148. Upper Abdomen: In the upper  abdomen the adrenal glands are preserved. There is atrophy of the left kidney. Of note the transverse colon has atypical distribution with components extending anterior to the liver margin between the liver in the anterior abdominal wall. Musculoskeletal: Erosive changes involving the right first and second rib in the right T1 vertebral level as above. Is also a lucent lesion  involving the T11 vertebral body worrisome for potential osseous metastatic deposit recommend further workup. Whole-body bone scan may be useful as the next step in the workup with the other findings. IMPRESSION: Known right extreme apical soft tissue mass with local bony erosive changes and invasion of the chest wall musculature worrisome for neoplasm. Again right hilar lymph node enlargement is noted but less well defined without the advantage of IV contrast. Lucent bone lesion at the T11 vertebral body. Worrisome for additional osseous metastatic deposit. No developing pneumothorax or effusion. Enlarged heart with enlarged main pulmonary artery. Aortic Atherosclerosis (ICD10-I70.0) and Emphysema (ICD10-J43.9). Electronically Signed   By: Jill Side M.D.   On: 06/11/2022 17:02   DG Chest Portable 1 View  Result Date: 06/11/2022 CLINICAL DATA:  Right shoulder pain, assess for pneumothorax. EXAM: PORTABLE CHEST 1 VIEW COMPARISON:  Chest radiograph and CT 05/10/2022 FINDINGS: Right apical opacity corresponds to apical mass on prior CT. Destructive changes of the right first rib also seen on prior CT. No pneumothorax. Mild cardiomegaly, stable. Unchanged mediastinal contours. No acute airspace disease. No pleural fluid. No pulmonary edema. IMPRESSION: Right apical opacity corresponds to apical mass on prior CT. Destructive changes of the right first rib also seen on prior CT. No pneumothorax. Electronically Signed   By: Keith Rake M.D.   On: 06/11/2022 16:13    Assessment and plan- Patient is a 72 y.o. male with right upper lobe lung mass and mediastinal adenopathy concerning for locally advanced lung cancer  PET CT scan on 06/25/2022 shows an 8.7 x 6 x 6.45 cm right apical lung mass with an SUV of 23 with evidence of chest wall invasion.  This mass also involves the posterior aspect of the first and second right ribs and T1 transverse process.  A dedicated MRI thoracic spine shows involvement of there  was no evidence of distant metastatic disease noted on PET scan.  MRI brain showed evidence of chronic infarcts but no evidence of intracranial metastases.  Findings are overall concerning for locally advanced lung cancer potentially stage IV given the T1 vertebral artery T1 vertebral body involvement.  Patient is awaiting rehab placement at this time and I am hoping he can undergo CT-guided lung biopsy while he is inpatient.  Patient has limited insight into his medical issue and is overall a poor historian.  He tells me that he would like his significant other/girlfriend to be his healthcare proxy.  He does have adult children but they are not involved in his care.  It would be worthwhile to get healthcare power of attorney documents in place while he is admitted.  Neoplasm related pain: Appreciate palliative care input and patient is currently on as needed oxycodone as well as long-acting morphine and as needed IV morphine.  I am also going to get in touch with Dr. Baruch Gouty from radiation oncology to see if he could offer any palliative radiation to his right apical mass as well as T1 vertebral body which may be contributing to neuropathic pain.  However it would be prudent to get a biopsy first before attempting radiation.  We will continue goals of care conversation  with him as an outpatient while involving his significant other.  Overall his performance status is poor and despite not having distant metastatic disease I worry if he would be able to tolerate concurrent chemoradiation    Visit Diagnosis 1. Weakness   2. Anemia, unspecified type   3. History of pulmonary embolism     Dr. Randa Evens, MD, MPH [facility] at [facility] [phone] 06/28/2022

## 2022-06-28 NOTE — TOC Progression Note (Signed)
Transition of Care [facility]) - Progression Note    Patient Details  Name: [name] MRN: [id number] Date of Birth: 1950/09/28  Transition of Care [facility]) CM/SW [place], RN Phone Number: 06/28/2022, 2:34 PM  Clinical Narrative:   spoke with the patient and reviewed the bed offers, he chose [facility] in [place], I notified [facility], Ins pending    Expected Discharge Plan: [place] Barriers to Discharge: Continued Medical Work up  Expected Discharge Plan and Services     Post Acute Care Choice: [place] Living arrangements for the past 2 months: Apartment                                       Social Determinants of Health (SDOH) Interventions SDOH Screenings   Food Insecurity: No Food Insecurity (06/26/2022)  Housing: Low Risk  (06/26/2022)  Transportation Needs: No Transportation Needs (06/26/2022)  Utilities: Not At Risk (06/26/2022)  Alcohol Screen: Low Risk  (06/21/2022)  Depression (PHQ2-9): Medium Risk (06/18/2022)  Financial Resource Strain: Low Risk  (06/21/2022)  Physical Activity: Inactive (06/21/2022)  Social Connections: Socially Isolated (06/21/2022)  Stress: Stress Concern Present (06/21/2022)  Tobacco Use: High Risk (06/26/2022)    Readmission Risk Interventions    01/29/2020   12:41 PM  Readmission Risk Prevention Plan  Transportation Screening Complete  Palliative Care Screening Not Applicable  Medication Review (RN Care Manager) Complete

## 2022-06-28 NOTE — Assessment & Plan Note (Signed)
Follows with Dr. Janese Banks. Recent PET scan day before admission - see results. Mass has invaded the right 2 upper ribs and T1. Outpatient biopsy with IR scheduled for 2/14 will be obtained inpatient. MRI brain and T-spine w/wo requested by Dr. Janese Banks - ordered. Palliative care consulted. Pain control per orders - titrate meds for adequate pain relief

## 2022-06-28 NOTE — Progress Notes (Signed)
Progress Note   Patient: [name] WSF:[id number] DOB: 03-11-1951 DOA: 06/25/2022     0 DOS: the patient was seen and examined on 06/28/2022   Brief hospital course: [name] is a 72 y.o. 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  No cough or wheezing or dyspnea.   ED evaluation revealed hyponatremia, hypochloremia, stable renal function with Cr 1.31, anemia with Hbg 9.1 (prior 11.9).   CXR showed right apical mass with destructive changes on the adjacent right 1st and 2nd ribs, stable from prior imaging.  Of note, patient follow with Dr. Janese Banks, oncology, and had undergone a PET scan on 2/9 the day before admission.  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  There is an outpatient CT-guided biopsy scheduled for 2/14 with IR.  2/10: PT/OT recommending SNF 2/11: orthostatic dizziness, not tolerating standing today, giving IV fluids   Assessment and Plan: * Generalized weakness Likely due to progressive malignancy and symptomatic anemia. - Negative hemoccult and no blood in stools - PT/OT recommend SNF --TOC following for placement  Orthostatic dizziness Working with PT today (2/11), pt BP sitting 134/82 >> 105/66 with standing and pt c/o dizziness and could not tolerate standing.  BP improved once seated. --gave small fluid bolus followed by maintenance IV fluids x 1 day --check orthostatic vitals daily --fall precautions --PT/OT  recommending SNF/rehab  Chronic combined systolic and diastolic CHF (congestive heart failure) (HCC) Appears dry on exam. Gentle fluids for orthostatic dizziness. Monitor volume status closely.  Chronic anticoagulation On Xarelto for A-fib and hx of DVT and PE Hold Xarelto for lung biopsy  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[place]) Follows with Dr. Janese Banks. Recent PET scan day before admission - see results. Mass has invaded the right 2 upper ribs and T1. Outpatient biopsy with IR scheduled for 2/14 will be obtained inpatient. MRI brain and T-spine w/wo requested by Dr. Janese Banks - ordered. Palliative care consulted. Pain control per orders - titrate meds for adequate pain relief  History of pulmonary embolus (PE) Hold Xarelto for lung biopsy  History of DVT (deep vein thrombosis) Hold Xarelto for lung biopsy  Hyponatremia Na 131 on admission >> 136 >> 132 (x2) Monitor off IV fluids Encourage PO intake & hydration Monitor BMP  Gout - Continue allopurinol.  Anemia Hbg stable in 9's since admission.   Prior baseline Hbg 11.9. Likely due to malignancy / chronic disease.   No signs of any bleeding.   Recent iron studies 06/18/22 - iron low 35, normal TIBC 294, low sat ratio 12%, elevated ferritin 715. --monitor CBC --transfuse RBC's in Hbg < 7.0 --will discuss w/ patient re iron infusion    Type 2 diabetes mellitus with diabetic polyneuropathy, without long-term current use of insulin ([place]) - The patient will be placed on supplemental coverage with NovoLog. - We will continue Neurontin.  Atrial fibrillation, chronic ([place]) Hold Xarelto for lung biopsy 2/14  Essential hypertension Continue home regimen   Dyslipidemia Continue statin   Chronic venous insufficiency No acute issues. Monitor.  CVA, old, speech/language deficit Continue statin Hold Xarelto  for lung biopsy        Subjective: Pt seated edge of bed, getting ready to work with therapy.  He reports ongoing severe  R shoulder pain.  No other acute complaints.    Physical Exam: Vitals:   06/28/22 0721 06/28/22 0834 06/28/22 0919 06/28/22 1249  BP: 113/79     Pulse: 73     Resp: 18 18 18 18   Temp: 97.9 F (36.6 C)     TempSrc: Oral     SpO2: 97%     Weight:      Height:       General exam: awake, alert, no acute distress HEENT: moist mucus membranes, hearing grossly normal  Respiratory system: coarse right sided crackles, diminished breath sounds, no wheezes, rales or rhonchi, normal respiratory effort. Cardiovascular system: normal S1/S2, RRR, no pedal edema.   Gastrointestinal system: soft, NT, ND Central nervous system: A&O x self and hospital at least. no gross focal neurologic deficits, dysarthric speech with mild asphasia at times Extremities: BLE venous stasis changes, no edema, normal tone Skin: dry, intact, normal temperature Psychiatry: normal mood, congruent affect, judgement and insight appear normal   Data Reviewed:  Notable labs --- Na 132 stable, glucose 106, last Hbg stable 9.3>>9.5  Family Communication: None at bedside, will attempt to call  Disposition: Status is: Observation The patient remains OBS appropriate and will d/c before 2 midnights.  Needs SNF/rehab placement. On IV fluids for orthostatic dizziness - he is unable to tolerating standing today   Planned Discharge Destination: SNF    Time spent: 44 minutes  Author: Ezekiel Slocumb, DO 06/28/2022 1:20 PM  For on call review [email or web address].

## 2022-06-29 ENCOUNTER — Other Ambulatory Visit: Payer: Medicare Other

## 2022-06-29 ENCOUNTER — Other Ambulatory Visit (HOSPITAL_COMMUNITY): Payer: Self-pay | Admitting: Student

## 2022-06-29 DIAGNOSIS — I69322 Dysarthria following cerebral infarction: Secondary | ICD-10-CM | POA: Diagnosis not present

## 2022-06-29 DIAGNOSIS — Z79899 Other long term (current) drug therapy: Secondary | ICD-10-CM | POA: Diagnosis not present

## 2022-06-29 DIAGNOSIS — R531 Weakness: Secondary | ICD-10-CM | POA: Diagnosis not present

## 2022-06-29 DIAGNOSIS — S46219A Strain of muscle, fascia and tendon of other parts of biceps, unspecified arm, initial encounter: Secondary | ICD-10-CM | POA: Diagnosis present

## 2022-06-29 DIAGNOSIS — I5042 Chronic combined systolic (congestive) and diastolic (congestive) heart failure: Secondary | ICD-10-CM | POA: Diagnosis present

## 2022-06-29 DIAGNOSIS — R627 Adult failure to thrive: Secondary | ICD-10-CM | POA: Diagnosis present

## 2022-06-29 DIAGNOSIS — M109 Gout, unspecified: Secondary | ICD-10-CM | POA: Diagnosis present

## 2022-06-29 DIAGNOSIS — M79603 Pain in arm, unspecified: Secondary | ICD-10-CM | POA: Diagnosis present

## 2022-06-29 DIAGNOSIS — E785 Hyperlipidemia, unspecified: Secondary | ICD-10-CM | POA: Diagnosis present

## 2022-06-29 DIAGNOSIS — I69328 Other speech and language deficits following cerebral infarction: Secondary | ICD-10-CM | POA: Diagnosis not present

## 2022-06-29 DIAGNOSIS — R42 Dizziness and giddiness: Secondary | ICD-10-CM | POA: Diagnosis not present

## 2022-06-29 DIAGNOSIS — E878 Other disorders of electrolyte and fluid balance, not elsewhere classified: Secondary | ICD-10-CM | POA: Diagnosis present

## 2022-06-29 DIAGNOSIS — C3411 Malignant neoplasm of upper lobe, right bronchus or lung: Secondary | ICD-10-CM | POA: Diagnosis present

## 2022-06-29 DIAGNOSIS — E871 Hypo-osmolality and hyponatremia: Secondary | ICD-10-CM | POA: Diagnosis not present

## 2022-06-29 DIAGNOSIS — M25511 Pain in right shoulder: Secondary | ICD-10-CM | POA: Diagnosis present

## 2022-06-29 DIAGNOSIS — E1151 Type 2 diabetes mellitus with diabetic peripheral angiopathy without gangrene: Secondary | ICD-10-CM | POA: Diagnosis present

## 2022-06-29 DIAGNOSIS — E222 Syndrome of inappropriate secretion of antidiuretic hormone: Secondary | ICD-10-CM | POA: Diagnosis present

## 2022-06-29 DIAGNOSIS — I482 Chronic atrial fibrillation, unspecified: Secondary | ICD-10-CM | POA: Diagnosis present

## 2022-06-29 DIAGNOSIS — E1142 Type 2 diabetes mellitus with diabetic polyneuropathy: Secondary | ICD-10-CM | POA: Diagnosis present

## 2022-06-29 DIAGNOSIS — I110 Hypertensive heart disease with heart failure: Secondary | ICD-10-CM | POA: Diagnosis present

## 2022-06-29 DIAGNOSIS — I872 Venous insufficiency (chronic) (peripheral): Secondary | ICD-10-CM | POA: Diagnosis present

## 2022-06-29 DIAGNOSIS — I69320 Aphasia following cerebral infarction: Secondary | ICD-10-CM | POA: Diagnosis not present

## 2022-06-29 DIAGNOSIS — F17210 Nicotine dependence, cigarettes, uncomplicated: Secondary | ICD-10-CM | POA: Diagnosis present

## 2022-06-29 DIAGNOSIS — C7989 Secondary malignant neoplasm of other specified sites: Secondary | ICD-10-CM | POA: Diagnosis present

## 2022-06-29 DIAGNOSIS — Z515 Encounter for palliative care: Secondary | ICD-10-CM | POA: Diagnosis not present

## 2022-06-29 DIAGNOSIS — R0602 Shortness of breath: Secondary | ICD-10-CM

## 2022-06-29 DIAGNOSIS — D630 Anemia in neoplastic disease: Secondary | ICD-10-CM | POA: Diagnosis present

## 2022-06-29 DIAGNOSIS — R52 Pain, unspecified: Secondary | ICD-10-CM | POA: Diagnosis present

## 2022-06-29 LAB — BASIC METABOLIC PANEL
Anion gap: 8 (ref 5–15)
BUN: 16 mg/dL (ref 8–23)
CO2: 25 mmol/L (ref 22–32)
Calcium: 9.8 mg/dL (ref 8.9–10.3)
Chloride: 95 mmol/L — ABNORMAL LOW (ref 98–111)
Creatinine, Ser: 0.93 mg/dL (ref 0.61–1.24)
GFR, Estimated: >60 mL/min (ref >60)
Glucose, Bld: 110 mg/dL — ABNORMAL HIGH (ref 70–99)
Potassium: 3.9 mmol/L (ref 3.5–5.1)
Sodium: 128 mmol/L — ABNORMAL LOW (ref 135–145)

## 2022-06-29 LAB — OSMOLALITY: Osmolality: 279 mOsm/kg (ref 275–295)

## 2022-06-29 LAB — SODIUM, URINE, RANDOM: Sodium, Ur: 62 mmol/L

## 2022-06-29 LAB — OSMOLALITY, URINE: Osmolality, Ur: 378 mOsm/kg (ref 300–900)

## 2022-06-29 MED ORDER — SODIUM CHLORIDE 0.9 % IV SOLN: INTRAVENOUS | Status: DC

## 2022-06-29 NOTE — Plan of Care (Signed)

## 2022-06-29 NOTE — Progress Notes (Addendum)
  Chaplain On-Call responded to [place] from [place], DO.  The Order was for Advance Directives information for the patient.  The patient was non-verbal. Ollen Gross received the AD documents from this Chaplain. Chaplain encouraged reading and discussion when the patient is able to do that with her.  Chaplain Pollyann Samples M.Div., [facility]

## 2022-06-29 NOTE — Progress Notes (Addendum)
Progress Note   Patient: [name] ZOX:[id number] DOB: 27-Jun-1950 DOA: 06/25/2022     0 DOS: the patient was seen and examined on 06/29/2022   Brief hospital course: Eliyas Suddreth is a 72 y.o. 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  No cough or wheezing or dyspnea.   ED evaluation revealed hyponatremia, hypochloremia, stable renal function with Cr 1.31, anemia with Hbg 9.1 (prior 11.9).   CXR showed right apical mass with destructive changes on the adjacent right 1st and 2nd ribs, stable from prior imaging.  Of note, patient follow with Dr. Janese Banks, oncology, and had undergone a PET scan on 2/9 the day before admission.  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  There is an outpatient CT-guided biopsy scheduled for 2/14 with IR.  2/10: PT/OT recommending SNF 2/11: orthostatic dizziness, not tolerating standing today, giving IV fluids  2/12>>13: staging MRI's, seen by oncology and palliative care, titrating pain medications 2/14: worsening hyponatremia, resuming IV fluids  Assessment and Plan: * Right shoulder pain Due to RUL mass invading right ribs 1-2. Oncology considering palliative radiation. Pain control per orders. Palliative care following.  Generalized weakness Likely due to progressive malignancy and symptomatic anemia. - Negative hemoccult and no blood in stools - PT/OT recommend SNF --TOC following for  placement  Hyponatremia Na 131 on admission >> 136 >> 132 (x2) >> 128 today Previously improved with fluids. 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 If Na not improving with hydration, would start on fluid restriction. Encourage PO intake & hydration Monitor BMP  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[place]) Follows with Dr. Janese Banks - consulted. Recent PET scan day before admission - see results. Mass has invaded the right 2 upper ribs and T1. Outpatient biopsy with IR scheduled for 2/14 will be obtained inpatient.  NPO after midnight MRI brain and T-spine completed 2/12, see reports Palliative care consulted. Pain control per orders, medications be titrated  Orthostatic dizziness Working with PT (2/11), pt BP sitting 134/82 >> 105/66 with standing and pt c/o dizziness and could not tolerate standing.  BP improved once seated. --gave small fluid bolus followed by maintenance IV fluids x 1 day --2/14 resuming fluids for hyponatremia --check orthostatic vitals daily --fall precautions --PT/OT recommending SNF/rehab  Chronic combined systolic and diastolic CHF (congestive heart failure) (HCC) Appears dry on exam. Gentle fluids for orthostatic dizziness and hyponatremia Monitor volume status closely. Home Lasix on hold  Chronic anticoagulation On Xarelto for A-fib and hx of DVT and PE Hold Xarelto for lung biopsy  History of pulmonary embolus (PE) Hold Xarelto for lung biopsy  History of DVT (deep vein thrombosis) Hold Xarelto for lung biopsy  Gout - Continue allopurinol.  Anemia Hbg stable in 9's since admission.   Prior baseline Hbg 11.9. Likely due to malignancy / chronic disease.   No signs of any bleeding.   Recent iron studies 06/18/22 - iron low 35, normal TIBC 294, low sat ratio 12%, elevated ferritin 715. --monitor CBC --transfuse RBC's in Hbg < 7.0 Hbg improving  9.1 >> ... 9.7    Type 2 diabetes mellitus with diabetic  polyneuropathy, without long-term current use of insulin (HCC) supplemental coverage with NovoLog. continue Neurontin.  Atrial fibrillation, chronic ([place]) Hold Xarelto for lung biopsy 2/14  Essential hypertension Continue Toprol-XL Home lasix and losartan on hold BP's on controlled, soft at times due to pain meds, had +orthostatic vitals  Dyslipidemia Continue statin   Chronic venous insufficiency No acute issues. Monitor.  CVA, old, speech/language deficit Continue statin Hold Xarelto for lung biopsy   on chronic prednisone and prophylactic Bactrim, but have been unable to locate reason in chart review - these are continued     Subjective: Pt awake but drowsy, resting in bed, girlfriend is at bedside this AM.  Pt appears more comfortable, he states pain a little better controlled.  He falls asleep very easily.  He is aware of biopsy planned tomorrow and agreeable.  No other acute complaints.   Physical Exam: Vitals:   06/29/22 0811 06/29/22 0844 06/29/22 1506 06/29/22 1632  BP: 119/79  136/82   Pulse: 73  82   Resp: 16 18 17 16   Temp: 98.5 F (36.9 C)  98 F (36.7 C)   TempSrc:      SpO2: 100%  100%   Weight:      Height:       General exam: awake, drowsy appearing and falls asleep easily, no acute distress HEENT: moist mucus membranes, hearing grossly normal  Respiratory system: coarse right sided crackles, diminished breath sounds, no wheezes, rales or rhonchi, normal respiratory effort. Cardiovascular system: normal S1/S2, RRR, no pedal edema.   Gastrointestinal system: soft, NT, ND Central nervous system: A&O x self and hospital at least. dysarthric speech with mild asphasia  Extremities: BLE venous stasis changes, no edema, normal tone Skin: dry, intact, normal temperature Psychiatry: normal mood, congruent affect, judgement and insight appear normal   Data Reviewed:  Notable labs --- Na 132>>128, glucose 103, BUN 26, Cr 1.30 >> 1.02, last Hbg stable  9.7  Family Communication: Significant other at bedside on rounds this AM  Disposition: Status is: Observation The patient remains OBS appropriate and will d/c before 2 midnights.  Needs SNF/rehab placement. Requires SNF placement, pain remains uncontrolled. Lung biopsy planned tomorrow.   Planned Discharge Destination: SNF    Time spent: 36 minutes  Author: Ezekiel Slocumb, DO 06/29/2022 5:41 PM  For on call review [email or web address].

## 2022-06-29 NOTE — Assessment & Plan Note (Signed)
Due to RUL mass invading right ribs 1-2. Oncology considering palliative radiation. Pain control per palliative care Palliative care following.

## 2022-06-30 ENCOUNTER — Inpatient Hospital Stay: Payer: Medicare Other

## 2022-06-30 ENCOUNTER — Ambulatory Visit
Admission: RE | Admit: 2022-06-30 | Discharge: 2022-06-30 | Disposition: A | Discharge status: Home / Self Care | Payer: Medicare Other | Source: Ambulatory Visit | Attending: Oncology | Admitting: Oncology

## 2022-06-30 DIAGNOSIS — C3411 Malignant neoplasm of upper lobe, right bronchus or lung: Secondary | ICD-10-CM | POA: Diagnosis not present

## 2022-06-30 DIAGNOSIS — I482 Chronic atrial fibrillation, unspecified: Secondary | ICD-10-CM

## 2022-06-30 DIAGNOSIS — M25511 Pain in right shoulder: Secondary | ICD-10-CM | POA: Diagnosis not present

## 2022-06-30 DIAGNOSIS — I5042 Chronic combined systolic (congestive) and diastolic (congestive) heart failure: Secondary | ICD-10-CM | POA: Diagnosis not present

## 2022-06-30 DIAGNOSIS — R42 Dizziness and giddiness: Secondary | ICD-10-CM | POA: Diagnosis not present

## 2022-06-30 DIAGNOSIS — G8929 Other chronic pain: Secondary | ICD-10-CM

## 2022-06-30 LAB — BASIC METABOLIC PANEL
Anion gap: 7 (ref 5–15)
BUN: 17 mg/dL (ref 8–23)
CO2: 28 mmol/L (ref 22–32)
Calcium: 10 mg/dL (ref 8.9–10.3)
Chloride: 95 mmol/L — ABNORMAL LOW (ref 98–111)
Creatinine, Ser: 0.9 mg/dL (ref 0.61–1.24)
GFR, Estimated: >60 mL/min (ref >60)
Glucose, Bld: 106 mg/dL — ABNORMAL HIGH (ref 70–99)
Potassium: 4 mmol/L (ref 3.5–5.1)
Sodium: 130 mmol/L — ABNORMAL LOW (ref 135–145)

## 2022-06-30 LAB — CBC WITH DIFFERENTIAL/PLATELET
Abs Immature Granulocytes: 0.05 10*3/uL (ref 0.00–0.07)
Basophils Absolute: 0 10*3/uL (ref 0.0–0.1)
Basophils Relative: 0 %
Eosinophils Absolute: 0.1 10*3/uL (ref 0.0–0.5)
Eosinophils Relative: 1 %
HCT: 27.5 % — ABNORMAL LOW (ref 39.0–52.0)
Hemoglobin: 8.5 g/dL — ABNORMAL LOW (ref 13.0–17.0)
Immature Granulocytes: 1 %
Lymphocytes Relative: 11 %
Lymphs Abs: 0.7 10*3/uL (ref 0.7–4.0)
MCH: 24.3 pg — ABNORMAL LOW (ref 26.0–34.0)
MCHC: 30.9 g/dL (ref 30.0–36.0)
MCV: 78.6 fL — ABNORMAL LOW (ref 80.0–100.0)
Monocytes Absolute: 0.8 10*3/uL (ref 0.1–1.0)
Monocytes Relative: 14 %
Neutro Abs: 4.3 10*3/uL (ref 1.7–7.7)
Neutrophils Relative %: 73 %
Platelets: 174 10*3/uL (ref 150–400)
RBC: 3.5 MIL/uL — ABNORMAL LOW (ref 4.22–5.81)
RDW: 15.9 % — ABNORMAL HIGH (ref 11.5–15.5)
WBC: 5.9 10*3/uL (ref 4.0–10.5)
nRBC: 0 % (ref 0.0–0.2)

## 2022-06-30 LAB — GLUCOSE, CAPILLARY: Glucose-Capillary: 104 mg/dL — ABNORMAL HIGH (ref 70–99)

## 2022-06-30 LAB — PROTIME-INR
INR: 1.2 (ref 0.8–1.2)
Prothrombin Time: 15 seconds (ref 11.4–15.2)

## 2022-06-30 MED ORDER — FENTANYL CITRATE (PF) 100 MCG/2ML IJ SOLN
INTRAMUSCULAR | Status: AC | PRN
  Administered 2022-06-30: 25 ug via INTRAVENOUS

## 2022-06-30 MED ORDER — MIDAZOLAM HCL 2 MG/2ML IJ SOLN
INTRAMUSCULAR | Status: AC
  Filled 2022-06-30: qty 2

## 2022-06-30 MED ORDER — MIDAZOLAM HCL 2 MG/2ML IJ SOLN
INTRAMUSCULAR | Status: AC | PRN
  Administered 2022-06-30: 1 mg via INTRAVENOUS

## 2022-06-30 MED ORDER — RIVAROXABAN 20 MG PO TABS
20.0000 mg | ORAL_TABLET | Freq: Every day | ORAL | Status: DC
  Administered 2022-06-30 – 2022-07-01 (×2): 20 mg via ORAL
  Filled 2022-06-30 (×3): qty 1

## 2022-06-30 MED ORDER — FENTANYL CITRATE (PF) 100 MCG/2ML IJ SOLN
INTRAMUSCULAR | Status: AC
  Filled 2022-06-30: qty 2

## 2022-06-30 NOTE — Procedures (Signed)
Interventional Radiology Procedure Note  Date of Procedure: 06/30/2022  Procedure: CT lung biopsy   Findings:  1. CT RUL lung biposy 18ga cores x6    Complications: No immediate complications noted.   Estimated Blood Loss: minimal  Follow-up and Recommendations: 1. Bedrest 1 hour    Albin Felling, MD  Vascular & Interventional Radiology  06/30/2022 3:05 PM

## 2022-06-30 NOTE — Consult Note (Signed)
ANTICOAGULATION CONSULT NOTE - Initial Consult  Pharmacy Consult for Xarelto dosing Indication:  Hx of DVT/PE and Afib  No Known Allergies  Patient Measurements: Height: 6\' 2"  (188 cm) Weight: 84.1 kg (185 lb 6.5 oz) IBW/kg (Calculated) : 82.2   Vital Signs: Temp: 97.8 F (36.6 C) (02/14 1629) Temp Source: Oral (02/14 1400) BP: 137/91 (02/14 1629) Pulse Rate: 76 (02/14 1629)  Labs: Recent Labs    06/28/22 0501 06/28/22 1350 06/29/22 0333 06/30/22 0328  HGB 9.5*  --   --  8.5*  HCT 30.8*  --   --  27.5*  PLT  --   --   --  174  LABPROT  --  16.5*  --  15.0  INR  --  1.3*  --  1.2  CREATININE 0.96  --  0.93 0.90    Estimated Creatinine Clearance: 85 mL/min (by C-G formula based on SCr of 0.9 mg/dL).   Medical History: Past Medical History:  Diagnosis Date   Arthritis    Atrial fibrillation ([place])    Cancer ([place])    Prostate   CHF (congestive heart failure) (HCC)    Diabetes mellitus without complication ([place])    Gout current and history of   History of cocaine abuse ([place])    + UDS on admission   History of deep vein thrombosis 2006   History of tobacco abuse    has smoked for 50 years   Hypertension    Pulmonary embolism ([place]) 2010   Renal infarct ([place]) 03/2019   left renal infarct   Stroke ([place]) 2017   affected speech and right side-uses cane   Varicose veins of both lower extremities    Wears dentures    full upper, partial lower    Medications:  Patient reports being on rivaroxaban 15 mg po daily at home.  Assessment: 72 yo male presented to ED with worsening weakness and right shoulder and right-sided chest pain.  Has history of Pancoast tumor of right lung.  Patient found to have RUL mass invading 1-2 ribs.  Home rivaroxaban held for biopsy.  Now that biopsy is complete pharmacy consulted to resume rivaroxaban.  Goal of Therapy:  Monitor platelets by anticoagulation protocol: Yes   Plan:  -Patient has eCrCl > 50 ml/min -Start rivaroxaban 20  mg po daily with supper -Check CBC at least every 72 hours while inpatient.  Lorin Picket, PharmD 06/30/2022,5:30 PM

## 2022-06-30 NOTE — Progress Notes (Signed)
OT Cancellation Note  Patient Details Name: [name] MRN: [id number] DOB: 02-22-1951   Cancelled Treatment:    Reason Eval/Treat Not Completed: Fatigue/lethargy limiting ability to participate. Per notes, pt lethargic and in intense RUE pain today. Awaiting RUE biopsy today. Will hold OT until pt is able to participate.   Vania Rea 06/30/2022, 1:26 PM

## 2022-06-30 NOTE — Progress Notes (Signed)
PT Cancellation Note  Patient Details Name: [name] MRN: [id number] DOB: April 10, 1951   Cancelled Treatment:     Pt resting in bed upon arrival, continues to have significant R UE radiating pain. Pt unable to keep eyes open during conversation or participate in skilled PT session. Will re-attempt next available date/time as appropriate.    [name] 06/30/2022, 11:34 AM

## 2022-06-30 NOTE — Progress Notes (Signed)
Progress Note   Patient: Erasmo Vertz WYO:[id number] DOB: 13-Dec-1950 DOA: 06/25/2022     1 DOS: the patient was seen and examined on 06/30/2022   Brief hospital course: Rhett Mutschler is a 72 y.o. 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  No cough or wheezing or dyspnea.   ED evaluation revealed hyponatremia, hypochloremia, stable renal function with Cr 1.31, anemia with Hbg 9.1 (prior 11.9).   CXR showed right apical mass with destructive changes on the adjacent right 1st and 2nd ribs, stable from prior imaging.  Of note, patient follow with Dr. Janese Banks, oncology, and had undergone a PET scan on 2/9 the day before admission.  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  There is an outpatient CT-guided biopsy scheduled for 2/14 with IR.  2/10: PT/OT recommending SNF 2/11: orthostatic dizziness, not tolerating standing today, giving IV fluids  2/12>>13: staging MRI's, seen by oncology and palliative care, titrating pain medications 2/14: CT-guided lung biopsy  Assessment and Plan: * Right shoulder pain Due to RUL mass invading right ribs 1-2. Oncology considering palliative radiation. Pain control per palliative care Palliative care following.  Generalized weakness Likely due to progressive malignancy and symptomatic anemia. - Negative hemoccult and no blood in stools - PT/OT recommend SNF --TOC following for placement.  Hyponatremia Na 131 on  admission >> 136 >> 132 (x2) >> 130 today Previously improved with fluids. 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  Pancoast tumor of right lung [facility]) Follows with Dr. Janese Banks - consulted. Recent PET scan day before admission - see results. Mass has invaded the right 2 upper ribs and T1. Status post CT-guided lung biopsy today MRI brain and T-spine completed 2/12, see reports Palliative care following Pain control per orders, medications be titrated  Orthostatic dizziness Working with PT (2/11), pt BP sitting 134/82 >> 105/66 with standing and pt c/o dizziness and could not tolerate standing.  BP improved once seated. --gave small fluid bolus followed by maintenance IV fluids x 1 day --fall precautions --PT/OT recommending SNF/rehab.  TOC aware and working on placement  Chronic combined systolic and diastolic CHF (congestive heart failure) ([place]) Appears dry on exam. Gentle fluids for orthostatic dizziness and hyponatremia Monitor volume status closely. Home Lasix on hold.  Chronic anticoagulation On Xarelto for A-fib and hx of DVT and PE  History of pulmonary embolus (PE) Resume Xarelto  History of DVT (deep vein thrombosis) Resume Xarelto now as the biopsy has been performed  Gout - Continue allopurinol   Anemia Hbg stable in 9's since admission.   Prior baseline Hbg 11.9. Likely due to malignancy / chronic disease.   No signs of any bleeding.   Recent iron studies 06/18/22 - iron low 35, normal TIBC 294, low sat ratio 12%, elevated ferritin 715. --monitor CBC --transfuse RBC's in Hbg < 7.0     Type 2 diabetes mellitus with diabetic polyneuropathy, without long-term current use of insulin (HCC) supplemental coverage with NovoLog. continue Neurontin  Atrial fibrillation, chronic (HCC) Resume Xarelto  Essential hypertension  Continue Toprol-XL Home lasix and losartan on hold BP's on controlled, soft at times due to  pain meds, had +orthostatic vitals.  Dyslipidemia Continue statin.  Chronic venous insufficiency No acute issues. Monitor  CVA, old, speech/language deficit Continue statin and resume Xarelto        Subjective: Asking when he would be getting biopsy of his lung to get further evaluation of mass  Physical Exam: Vitals:   06/30/22 1522 06/30/22 1530 06/30/22 1545 06/30/22 1629  BP: (!) 131/92 (!) 144/99 (!) 148/98 (!) 137/91  Pulse: 79 82 75 76  Resp: 14 19 13 16   Temp:    97.8 F (36.6 C)  TempSrc:      SpO2: 99% 100% 99% 100%  Weight:      Height:       General exam: awake, drowsy appearing and falls asleep easily, no acute distress HEENT: moist mucus membranes, hearing grossly normal  Respiratory system: coarse right sided crackles, diminished breath sounds, no wheezes, rales or rhonchi, normal respiratory effort. Cardiovascular system: normal S1/S2, RRR, no pedal edema.   Gastrointestinal system: soft, NT, ND Central nervous system: A&O x self and hospital at least. dysarthric speech with mild asphasia  Extremities: BLE venous stasis changes, no edema, normal tone Skin: dry, intact, normal temperature Psychiatry: normal mood, congruent affect, judgement and insight appear normal  Data Reviewed:  Hemoglobin 8.5  Family Communication: None at bedside  Disposition: Status is: Inpatient Remains inpatient appropriate because: Malignancy workup  Planned Discharge Destination: Skilled nursing facility   DVT prophylaxis-Xarelto Time spent: 35 minutes  Author: Max Sane, MD 06/30/2022 5:24 PM  For on call review [email or web address].

## 2022-07-01 ENCOUNTER — Inpatient Hospital Stay: Payer: Medicare Other

## 2022-07-01 ENCOUNTER — Inpatient Hospital Stay: Payer: Medicare Other | Admitting: Hospice and Palliative Medicine

## 2022-07-01 DIAGNOSIS — R531 Weakness: Secondary | ICD-10-CM | POA: Diagnosis not present

## 2022-07-01 DIAGNOSIS — Z515 Encounter for palliative care: Secondary | ICD-10-CM | POA: Diagnosis not present

## 2022-07-01 LAB — BASIC METABOLIC PANEL
Anion gap: 7 (ref 5–15)
BUN: 13 mg/dL (ref 8–23)
CO2: 26 mmol/L (ref 22–32)
Calcium: 10 mg/dL (ref 8.9–10.3)
Chloride: 98 mmol/L (ref 98–111)
Creatinine, Ser: 0.85 mg/dL (ref 0.61–1.24)
GFR, Estimated: >60 mL/min (ref >60)
Glucose, Bld: 104 mg/dL — ABNORMAL HIGH (ref 70–99)
Potassium: 4.1 mmol/L (ref 3.5–5.1)
Sodium: 131 mmol/L — ABNORMAL LOW (ref 135–145)

## 2022-07-01 LAB — CBC
HCT: 28.2 % — ABNORMAL LOW (ref 39.0–52.0)
Hemoglobin: 8.7 g/dL — ABNORMAL LOW (ref 13.0–17.0)
MCH: 24.2 pg — ABNORMAL LOW (ref 26.0–34.0)
MCHC: 30.9 g/dL (ref 30.0–36.0)
MCV: 78.3 fL — ABNORMAL LOW (ref 80.0–100.0)
Platelets: 184 10*3/uL (ref 150–400)
RBC: 3.6 MIL/uL — ABNORMAL LOW (ref 4.22–5.81)
RDW: 15.8 % — ABNORMAL HIGH (ref 11.5–15.5)
WBC: 6.1 10*3/uL (ref 4.0–10.5)
nRBC: 0 % (ref 0.0–0.2)

## 2022-07-01 NOTE — Assessment & Plan Note (Signed)
Due to RUL mass invading right ribs 1-2. Oncology considering palliative radiation as an outpatient Pain control per palliative care Palliative care following.

## 2022-07-01 NOTE — Assessment & Plan Note (Signed)
Na 131 on admission >> 136 >> 132 (x2) >> 131 today Previously improved with fluids. 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

## 2022-07-01 NOTE — Progress Notes (Signed)
Patient has great vasculature; please attempt/assess at bedside prior to placing IVT consult.  Lanard Arguijo Lorita Officer, RN

## 2022-07-01 NOTE — Assessment & Plan Note (Signed)
Hbg stable in 9's since admission.   Prior baseline Hbg 11.9. Likely due to malignancy / chronic disease.   No signs of any bleeding.   Recent iron studies 06/18/22 - iron low 35, normal TIBC 294, low sat ratio 12%, elevated ferritin 715. --monitor CBC --transfuse RBC's in Hbg < 7.0

## 2022-07-01 NOTE — Assessment & Plan Note (Signed)
Continue statin. 

## 2022-07-01 NOTE — Assessment & Plan Note (Signed)
Working with PT (2/11), pt BP sitting 134/82 >> 105/66 with standing and pt c/o dizziness and could not tolerate standing.  BP improved once seated.  --PT/OT recommending SNF/rehab.  TOC aware and working on placement.

## 2022-07-01 NOTE — Progress Notes (Signed)
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  Patient is status post lung biopsy.  Patient currently endorses pain but overall has required less oxycodone since dose escalation of the MS Contin.  Discussed with RN and patient has been rating pain on average 3-4 out of 10 today.  RN will give dose of oxycodone now.  Will continue MS Contin/oxycodone at rehab.  Plan is for outpatient XRT with hope that that will also improve pain.  PLAN: -Continue current scope of treatment -Continue MS Contin/oxycodone -Plan for outpatient follow-up in the [facility] palliative care following at rehab   Time Total: 15 minutes  Visit consisted of counseling and education dealing with the complex and emotionally intense issues of symptom management and palliative care in the setting of serious and potentially life-threatening illness.Greater than 50%  of this time was spent counseling and coordinating care related to the above assessment and plan.  Signed by:  Altha Harm, PhD, NP-C

## 2022-07-01 NOTE — Assessment & Plan Note (Addendum)
Continue Xarelto 

## 2022-07-01 NOTE — Assessment & Plan Note (Signed)
Continue Xarelto 

## 2022-07-01 NOTE — Assessment & Plan Note (Signed)
Follows with Dr. Janese Banks - consulted. Recent PET scan day before admission - see results. Mass has invaded the right 2 upper ribs and T1. S/p CT-guided lung biopsy on 2/14 MRI brain and T-spine completed 2/12, see reports Palliative care following Pain control per orders, medications be titrated.

## 2022-07-01 NOTE — Assessment & Plan Note (Signed)
Continue statin and resume Xarelto

## 2022-07-01 NOTE — TOC Progression Note (Signed)
Transition of Care [facility]) - Progression Note    Patient Details  Name: [name] MRN: [id number] Date of Birth: [birth year]  [facility]) CM/SW [place], RN Phone Number: 07/01/2022, 9:38 AM  Clinical Narrative:   The patient had a Biopsy done yesterday, Once he is able to work with PT and OT Ins process will be started to get approval to go [facility] [place]    Expected Discharge Plan: [place] Barriers to Discharge: Insurance Authorization  Expected Discharge Plan and [place] Choice: [place] arrangements for the past 2 months: Apartment                                       Social Determinants of Health (SDOH) Interventions SDOH Screenings   Food Insecurity: No Food Insecurity (06/26/2022)  Housing: Low Risk  (06/26/2022)  Transportation Needs: No Transportation Needs (06/26/2022)  Utilities: Not At Risk (06/26/2022)  Alcohol Screen: Low Risk  (06/21/2022)  Depression (PHQ2-9): Medium Risk (06/18/2022)  Financial Resource Strain: Low Risk  (06/21/2022)  Physical Activity: Inactive (06/21/2022)  Social Connections: Socially Isolated (06/21/2022)  Stress: Stress Concern Present (06/21/2022)  Tobacco Use: High Risk (06/26/2022)    Readmission Risk Interventions    01/29/2020   12:41 PM  Readmission Risk Prevention Plan  Transportation Screening Complete  Palliative Care Screening Not Applicable  Medication Review (RN Care Manager) Complete

## 2022-07-01 NOTE — Plan of Care (Signed)

## 2022-07-01 NOTE — Assessment & Plan Note (Signed)
Continue Toprol-XL Home lasix and losartan on hold BP's on controlled, soft at times due to pain meds, had +orthostatic vitals.

## 2022-07-01 NOTE — Assessment & Plan Note (Signed)
Continue Xarelto now as the biopsy has been performed

## 2022-07-01 NOTE — Assessment & Plan Note (Signed)
supplemental coverage with NovoLog. continue Neurontin.

## 2022-07-01 NOTE — Progress Notes (Signed)
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 Appearing awake; willing to work with OT on t/f to chair and grooming. T/f CGA with RW. See flowsheet below for further details of session. Left seated in chair; BIL LE elevated; with all needs in reach. Pt presenting with decreased activity tolerance and need for ADL assist. Continue to recommend rehab at SNF upon d/c. Patient will benefit from continued OT while in acute care.    Recommendations for follow up therapy are one component of a multi-disciplinary discharge planning process, led by the attending physician.  Recommendations may be updated based on patient status, additional functional criteria and insurance authorization.    Follow Up Recommendations  Skilled nursing-short term rehab (<3 hours/day)     Assistance Recommended at Discharge Frequent or constant Supervision/Assistance  Patient can return home with the following  A little help with walking and/or transfers;A lot of help with bathing/dressing/bathroom;Assistance with cooking/housework;Direct supervision/assist for medications management;Help with stairs or ramp for entrance   Equipment Recommendations  Other (comment) (defer to next venue of care)    Recommendations for Other Services      Precautions / Restrictions Precautions Precautions: Fall Restrictions Weight Bearing Restrictions: No       Mobility Bed Mobility Overal bed mobility: Needs  Assistance Bed Mobility: Supine to Sit     Supine to sit: Supervision          Transfers Overall transfer level: Needs assistance Equipment used: Rolling walker (2 wheels) Transfers: Sit to/from Stand Sit to Stand: Min guard     Step pivot transfers: Min guard     General transfer comment: Using BIL UE on RW. Gait belt for safety; Pt needing cues to know when to stop in front of chair and reach back.     Balance Overall balance assessment: Needs assistance         Standing balance support: Bilateral upper extremity supported Standing balance-Leahy Scale: Fair                             ADL either performed or assessed with clinical judgement   ADL Overall ADL's : Needs assistance/impaired     Grooming: Oral care;Set up;Sitting;Minimal assistance Grooming Details (indicate cue type and reason): OT assisted pt in rinsing dentures in the sink                                    Extremity/Trunk Assessment Upper Extremity Assessment Upper Extremity Assessment: RUE deficits/detail RUE Deficits / Details: R shoulder pain   Lower Extremity Assessment Lower Extremity Assessment: Generalized weakness;Defer to PT evaluation        Vision       Perception     Praxis      Cognition Arousal/Alertness: Awake/alert Behavior During Therapy: [facility] for tasks assessed/performed Overall Cognitive Status: Within Functional Limits for tasks assessed  General Comments: Pt with difficulty communicating; sounds like he has dysarthria; unclear if he has cognitive impairment; pt was able to follow OT's visual and verbal cues during session today, and able to give yes/no answers.        Exercises      Shoulder Instructions       General Comments T/f to chair next to bed. Good sitting balance.    Pertinent Vitals/ Pain       Pain Assessment Pain Assessment: Faces Faces Pain Scale: Hurts a little  bit Pain Location: R UE Pain Descriptors / Indicators: Guarding Pain Intervention(s): Limited activity within patient's tolerance  Home Living                                          Prior Functioning/Environment              Frequency  Min 2X/week        Progress Toward Goals  OT Goals(current goals can now be found in the care plan section)  Progress towards OT goals: Progressing toward goals  Acute Rehab OT Goals Patient Stated Goal: Get better OT Goal Formulation: With patient Time For Goal Achievement: 07/11/22 Potential to Achieve Goals: Good ADL Goals Pt Will Perform Grooming: with modified independence;sitting Pt Will Perform Lower Body Dressing: sit to/from stand;with modified independence Pt Will Transfer to Toilet: with modified independence Pt Will Perform Toileting - Clothing Manipulation and hygiene: with modified independence;sit to/from stand  Plan Discharge plan remains appropriate;Frequency remains appropriate    Co-evaluation                 AM-PAC OT "6 Clicks" Daily Activity     Outcome Measure   Help from another person eating meals?: None Help from another person taking care of personal grooming?: A Little Help from another person toileting, which includes using toliet, bedpan, or urinal?: A Lot Help from another person bathing (including washing, rinsing, drying)?: A Lot Help from another person to put on and taking off regular upper body clothing?: A Little Help from another person to put on and taking off regular lower body clothing?: A Lot 6 Click Score: 16    End of Session Equipment Utilized During Treatment: Gait belt;Rolling walker (2 wheels)  OT Visit Diagnosis: Unsteadiness on feet (R26.81);Muscle weakness (generalized) (M62.81);Other abnormalities of gait and mobility (R26.89)   Activity Tolerance Patient tolerated treatment well   Patient Left in chair;with call bell/phone within reach;with chair  alarm set;with family/visitor present (RN and nursing student in room)   Nurse Communication Mobility status        Time: 4492-0100 OT Time Calculation (min): 23 min  Charges: OT General Charges $OT Visit: 1 Visit OT Treatments $Self Care/Home Management : 23-37 mins  Waymon Amato, MS, OTR/L   Vania Rea 07/01/2022, 1:14 PM

## 2022-07-01 NOTE — Assessment & Plan Note (Signed)
Likely due to progressive malignancy and symptomatic anemia. - Negative hemoccult and no blood in stools - PT/OT recommend SNF --TOC following for placement.

## 2022-07-01 NOTE — TOC Progression Note (Signed)
Transition of Care [facility]) - Progression Note    Patient Details  Name: [name] MRN: [id number] Date of Birth: 1950-09-10  Transition of Care [facility]) CM/SW [place], RN Phone Number: 07/01/2022, 3:09 PM  Clinical Narrative:  Insurance auth process started and pending ref number [id number], to go to [facility] once approved    Expected Discharge Plan: [place] Barriers to Discharge: Insurance Authorization  Expected Discharge Plan and Services     Post Acute Care Choice: [place] Living arrangements for the past 2 months: Apartment                                       Social Determinants of Health (SDOH) Interventions SDOH Screenings   Food Insecurity: No Food Insecurity (06/26/2022)  Housing: Low Risk  (06/26/2022)  Transportation Needs: No Transportation Needs (06/26/2022)  Utilities: Not At Risk (06/26/2022)  Alcohol Screen: Low Risk  (06/21/2022)  Depression (PHQ2-9): Medium Risk (06/18/2022)  Financial Resource Strain: Low Risk  (06/21/2022)  Physical Activity: Inactive (06/21/2022)  Social Connections: Socially Isolated (06/21/2022)  Stress: Stress Concern Present (06/21/2022)  Tobacco Use: High Risk (06/26/2022)    Readmission Risk Interventions    01/29/2020   12:41 PM  Readmission Risk Prevention Plan  Transportation Screening Complete  Palliative Care Screening Not Applicable  Medication Review (RN Care Manager) Complete

## 2022-07-01 NOTE — Progress Notes (Signed)
Progress Note   Patient: [name] PNT:[id number] DOB: 10/21/1950 DOA: 06/25/2022     2 DOS: the patient was seen and examined on 07/01/2022   Brief hospital course: [name] is a 72 y.o. 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  No cough or wheezing or dyspnea.   ED evaluation revealed hyponatremia, hypochloremia, stable renal function with Cr 1.31, anemia with Hbg 9.1 (prior 11.9).   CXR showed right apical mass with destructive changes on the adjacent right 1st and 2nd ribs, stable from prior imaging.  Of note, patient follow with Dr. Janese Banks, oncology, and had undergone a PET scan on 2/9 the day before admission.  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  There is an outpatient CT-guided biopsy scheduled for 2/14 with IR.  2/10: PT/OT recommending SNF 2/11: orthostatic dizziness, not tolerating standing today, giving IV fluids  2/12>>13: staging MRI's, seen by oncology and palliative care, titrating pain medications 2/14: CT-guided lung biopsy 2/15: PT, OT reeval for discharge planning  Assessment and Plan: * Right shoulder pain Due to RUL mass invading right ribs 1-2. Oncology considering palliative radiation as an outpatient Pain control per palliative care Palliative care following.  Generalized weakness Likely due to progressive malignancy and symptomatic anemia. - Negative hemoccult and no blood in stools - PT/OT recommend  SNF --TOC following for placement.  Hyponatremia Na 131 on admission >> 136 >> 132 (x2) >> 131 today Previously improved with fluids. 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  Pancoast tumor of right lung [facility]) Follows with Dr. Janese Banks - consulted. Recent PET scan day before admission - see results. Mass has invaded the right 2 upper ribs and T1. S/p CT-guided lung biopsy on 2/14 MRI brain and T-spine completed 2/12, see reports Palliative care following Pain control per orders, medications be titrated.  Orthostatic dizziness Working with PT (2/11), pt BP sitting 134/82 >> 105/66 with standing and pt c/o dizziness and could not tolerate standing.  BP improved once seated.  --PT/OT recommending SNF/rehab.  TOC aware and working on placement.  Chronic combined systolic and diastolic CHF (congestive heart failure) (HCC) Appears dry on exam. Gentle fluids for orthostatic dizziness and hyponatremia Monitor volume status closely. Home Lasix on hold.  Chronic anticoagulation On Xarelto for A-fib and hx of DVT and PE  History of pulmonary embolus (PE) Continue Xarelto  History of DVT (deep vein thrombosis) Continue Xarelto now as the biopsy has been performed  Gout - Continue allopurinol   Anemia Hbg stable in 9's since admission.   Prior baseline Hbg 11.9. Likely due to malignancy / chronic disease.   No signs of any bleeding.   Recent iron studies 06/18/22 - iron low 35, normal TIBC 294, low sat ratio 12%, elevated ferritin 715. --monitor CBC --transfuse RBC's in Hbg < 7.0     Type 2 diabetes mellitus with diabetic polyneuropathy, without long-term current use of insulin (HCC) supplemental coverage with NovoLog. continue Neurontin.  Atrial fibrillation, chronic (HCC) Continue Xarelto  Essential hypertension Continue Toprol-XL Home  lasix and losartan on hold BP's on controlled, soft at times due to pain meds, had  +orthostatic vitals.  Dyslipidemia Continue statin.  Chronic venous insufficiency No acute issues. Monitor  CVA, old, speech/language deficit Continue statin and resume Xarelto        Subjective: Hoping to get his cancer treatment started soon  Physical Exam: Vitals:   06/30/22 1629 06/30/22 2347 07/01/22 0500 07/01/22 0851  BP: (!) 137/91 128/86  117/86  Pulse: 76 91  87  Resp: 16 20  17   Temp: 97.8 F (36.6 C) 98.5 F (36.9 C)  99.4 F (37.4 C)  TempSrc:      SpO2: 100% 100%  100%  Weight:   81.8 kg   Height:        General exam: awake, drowsy appearing and falls asleep easily, no acute distress HEENT: moist mucus membranes, hearing grossly normal  Respiratory system: coarse right sided crackles, diminished breath sounds, no wheezes, rales or rhonchi, normal respiratory effort. Cardiovascular system: normal S1/S2, RRR, no pedal edema.   Gastrointestinal system: soft, NT, ND Central nervous system: A&O x self and hospital at least. dysarthric speech with mild asphasia  Extremities: BLE venous stasis changes, no edema, normal tone Skin: dry, intact, normal temperature Psychiatry: normal mood, congruent affect, judgement and insight appear normal Data Reviewed:  Sodium 131  Family Communication: None at bedside  Disposition: Status is: Inpatient Remains inpatient appropriate because: Waiting for PT and OT reevaluation for discharge planning, will likely need SNF  Planned Discharge Destination: Skilled nursing facility   DVT prophylaxis-Xarelto Time spent: 35 minutes  Author: Max Sane, MD 07/01/2022 1:22 PM  For on call review [email or web address].

## 2022-07-01 NOTE — Assessment & Plan Note (Signed)
On Xarelto for A-fib and hx of DVT and PE

## 2022-07-01 NOTE — Progress Notes (Signed)
S/p Lung Bx yesterday, pain seems better controlled. Significant functional progress this session. Pt able to demonstrate 150ft of gait training with RW and CGA. Pt unable to grip walker with R hand due to pain. No LOB, pt very motivated and much more alert today. Pt continues to be a great candidate for short term rehab at SNF prior to returning home with girlfriend support.     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  Subjective Data  Subjective c/c R UE pain  Patient Stated Goal less pain  Precautions  Precautions Fall  Restrictions  Weight Bearing Restrictions No  Pain Assessment  Pain Assessment 0-10  Pain Score 3  Pain Location R UE  Pain Descriptors / Indicators Guarding  Pain Intervention(s) Limited activity within patient's tolerance  Cognition  Arousal/Alertness Awake/alert  Behavior During Therapy WFL for tasks assessed/performed  Overall Cognitive Status Within Functional Limits for tasks assessed  General Comments Pt with difficulty communicating due to affects of past CVA  Bed Mobility  Overal bed mobility Needs Assistance  Bed Mobility Sit to Supine  Supine to sit Min guard  Transfers  Overall transfer level Needs assistance  Equipment used Rolling walker (2 wheels)  Transfers Sit to/from Stand  Sit to Stand Min guard  Step pivot transfers Min guard  General transfer comment Using BIL UE on RW. Gait belt for safety; Pt needing cues to know when to stop in front of chair and reach back.  Ambulation/Gait  Ambulation/Gait assistance Min guard  Gait Distance (Feet) 100 Feet   Assistive device Rolling walker (2 wheels)  Gait Pattern/deviations Step-through pattern;Decreased step length - right;Decreased step length - left;Trunk flexed  General Gait Details Decreased grip stength on R due pain with light touch  Gait velocity decreased  Balance  Overall balance assessment Needs assistance  Sitting-balance support Feet supported  Sitting balance-Leahy Scale Good  Standing balance support Bilateral upper extremity supported  Standing balance-Leahy Scale Fair  Standing balance comment needs assitance 2/2 to RUE pain and unsteadiness  General Comments  General comments (skin integrity, edema, etc.)  (Pt and girlfriend educated on PT role and current goals. Discussed POC and plan to transfer to [facility])  Exercises  Exercises General Lower Extremity  General Exercises - Lower Extremity  Ankle Circles/Pumps AROM;Both;10 reps  Long Arc Quad AROM;Both;10 reps  PT - End of Session  Equipment Utilized During Treatment Gait belt  Activity Tolerance Patient tolerated treatment well  Patient left in bed;with call bell/phone within reach;with bed alarm set;with family/visitor present  Nurse Communication Mobility status   PT - Assessment/Plan  PT Plan Current plan remains appropriate  PT Visit Diagnosis Unsteadiness on feet (R26.81);Other abnormalities of gait and mobility (R26.89);Difficulty in walking, not elsewhere classified (R26.2);Dizziness and giddiness (R42);Pain  Pain - Right/Left Right  Pain - part of body Arm  PT Frequency (ACUTE ONLY) Min 2X/week  Follow Up Recommendations Skilled nursing-short term rehab (<3 hours/day)  Can patient physically be transported by private vehicle Yes  Assistance recommended at discharge Intermittent Supervision/Assistance  Patient can return home with the following A little help with walking and/or transfers;Assistance  with cooking/housework;Direct supervision/assist for medications management;Assist for transportation;Help with  stairs or ramp for entrance  PT equipment None recommended by PT  AM-PAC PT "6 Clicks" Mobility Outcome Measure (Version 2)  Help needed turning from your back to your side while in a flat bed without using bedrails? 4  Help needed moving from lying on your back to sitting on the side of a flat bed without using bedrails? 4  Help needed moving to and from a bed to a chair (including a wheelchair)? 3  Help needed standing up from a chair using your arms (e.g., wheelchair or bedside chair)? 3  Help needed to walk in hospital room? 3  Help needed climbing 3-5 steps with a railing?  1  6 Click Score 18  Consider Recommendation of Discharge To: Home with [facility]  Progressive Mobility  What is the highest level of mobility based on the progressive mobility assessment? Level 5 (Walks with assist in room/hall) - Balance while stepping forward/back and can walk in room with assist - Complete  Activity Ambulated with assistance in hallway  PT Goal Progression  Progress towards PT goals Progressing toward goals  PT Time Calculation  PT Start Time (ACUTE ONLY) 1145  PT Stop Time (ACUTE ONLY) 1210  PT Time Calculation (min) (ACUTE ONLY) 25 min  PT General Charges  $$ ACUTE PT VISIT 1 Visit  PT Treatments  $Gait Training 8-22 mins  $Therapeutic Exercise 8-22 mins   Mikel Cella, PTA

## 2022-07-01 NOTE — Assessment & Plan Note (Signed)
Continue allopurinol 

## 2022-07-01 NOTE — Assessment & Plan Note (Signed)
No acute issues. Monitor

## 2022-07-01 NOTE — Assessment & Plan Note (Signed)
Appears dry on exam. Gentle fluids for orthostatic dizziness and hyponatremia Monitor volume status closely. Home Lasix on hold.

## 2022-07-02 ENCOUNTER — Other Ambulatory Visit: Payer: Self-pay | Admitting: Hospice and Palliative Medicine

## 2022-07-02 DIAGNOSIS — C3411 Malignant neoplasm of upper lobe, right bronchus or lung: Secondary | ICD-10-CM

## 2022-07-02 LAB — SURGICAL PATHOLOGY

## 2022-07-02 LAB — BASIC METABOLIC PANEL
Anion gap: 5 (ref 5–15)
BUN: 14 mg/dL (ref 8–23)
CO2: 27 mmol/L (ref 22–32)
Calcium: 9.7 mg/dL (ref 8.9–10.3)
Chloride: 101 mmol/L (ref 98–111)
Creatinine, Ser: 0.82 mg/dL (ref 0.61–1.24)
GFR, Estimated: >60 mL/min (ref >60)
Glucose, Bld: 111 mg/dL — ABNORMAL HIGH (ref 70–99)
Potassium: 4 mmol/L (ref 3.5–5.1)
Sodium: 133 mmol/L — ABNORMAL LOW (ref 135–145)

## 2022-07-02 LAB — CBC
HCT: 27 % — ABNORMAL LOW (ref 39.0–52.0)
Hemoglobin: 8.4 g/dL — ABNORMAL LOW (ref 13.0–17.0)
MCH: 24.5 pg — ABNORMAL LOW (ref 26.0–34.0)
MCHC: 31.1 g/dL (ref 30.0–36.0)
MCV: 78.7 fL — ABNORMAL LOW (ref 80.0–100.0)
Platelets: 159 10*3/uL (ref 150–400)
RBC: 3.43 MIL/uL — ABNORMAL LOW (ref 4.22–5.81)
RDW: 15.9 % — ABNORMAL HIGH (ref 11.5–15.5)
WBC: 5.4 10*3/uL (ref 4.0–10.5)
nRBC: 0 % (ref 0.0–0.2)

## 2022-07-02 MED ORDER — RIVAROXABAN 20 MG PO TABS: 20.0000 mg | ORAL_TABLET | Freq: Every day | ORAL | 0 refills | Status: DC

## 2022-07-02 MED ORDER — OXYCODONE HCL 10 MG PO TABS: 10.0000 mg | ORAL_TABLET | ORAL | 0 refills | Status: AC | PRN

## 2022-07-02 MED ORDER — MORPHINE SULFATE ER 30 MG PO TBCR: 30.0000 mg | EXTENDED_RELEASE_TABLET | Freq: Three times a day (TID) | ORAL | 0 refills | Status: AC

## 2022-07-02 NOTE — TOC Progression Note (Signed)
Transition of Care [facility]) - Progression Note    Patient Details  Name: [name] MRN: [id number] Date of Birth: 11-01-1950  Transition of Care [facility]) CM/SW [place], RN Phone Number: 07/02/2022, 9:55 AM  Clinical Narrative:    Spoke with the patient's [facility] and explained that the patient will go to [facility] today, she stated understanding  Expected Discharge Plan: [place] Barriers to Discharge: Insurance Authorization  Expected Discharge Plan and [place] Choice: [place] Living arrangements for the past 2 months: Apartment Expected Discharge Date: 07/02/22                                     Social Determinants of Health (SDOH) Interventions SDOH Screenings   Food Insecurity: No Food Insecurity (06/26/2022)  Housing: Low Risk  (06/26/2022)  Transportation Needs: No Transportation Needs (06/26/2022)  Utilities: Not At Risk (06/26/2022)  Alcohol Screen: Low Risk  (06/21/2022)  Depression (PHQ2-9): Medium Risk (06/18/2022)  Financial Resource Strain: Low Risk  (06/21/2022)  Physical Activity: Inactive (06/21/2022)  Social Connections: Socially Isolated (06/21/2022)  Stress: Stress Concern Present (06/21/2022)  Tobacco Use: High Risk (06/26/2022)    Readmission Risk Interventions    01/29/2020   12:41 PM  Readmission Risk Prevention Plan  Transportation Screening Complete  Palliative Care Screening Not Applicable  Medication Review (RN Care Manager) Complete

## 2022-07-02 NOTE — Plan of Care (Signed)

## 2022-07-02 NOTE — Discharge Summary (Signed)
Physician Discharge Summary   Patient: [name] MRN: [id number] DOB: [birth year]  Admit date:     06/25/2022  Discharge date: 07/02/22  Discharge Physician: Max Sane   PCP: [facility]   Recommendations at discharge:    F/up with outpt providers as requested  Discharge Diagnoses: Principal Problem:   Right shoulder pain Active Problems:   Orthostatic dizziness   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HCC)   Hyponatremia   Weakness   Chronic combined systolic and diastolic CHF (congestive heart failure) (HCC)   Chronic anticoagulation   History of DVT (deep vein thrombosis)   History of pulmonary embolus (PE)   Type 2 diabetes mellitus with diabetic polyneuropathy, without long-term current use of insulin (HCC)   Anemia   Gout   Essential hypertension   Atrial fibrillation, chronic (HCC)   CVA, old, speech/language deficit   PAD (peripheral artery disease) ([place])   Chronic venous insufficiency   Dyslipidemia   Palliative care encounter   Intractable pain  Hospital Course: [name] is a 72 y.o. 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  No cough or wheezing or dyspnea.   ED evaluation revealed hyponatremia, hypochloremia, stable renal function with Cr 1.31, anemia with Hbg 9.1 (prior 11.9).   CXR showed right apical mass with destructive changes on the adjacent right 1st and 2nd ribs, stable from prior imaging.  Of note, patient follow with Dr. Janese Banks, oncology, and had undergone a PET scan on 2/9 the day  before admission.  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  There is an outpatient CT-guided biopsy scheduled for 2/14 with IR.  2/10: PT/OT recommending SNF 2/11: orthostatic dizziness, not tolerating standing today, giving IV fluids  2/12>>13: staging MRI's, seen by oncology and palliative care, titrating pain medications 2/14: CT-guided lung biopsy 2/15: PT, OT reeval for discharge planning  Assessment and Plan: * Right shoulder pain Due to RUL mass invading right ribs 1-2. Pain mgmt and outpt onco f/up with palliative radiation and cancer treatment  Weakness Likely due to progressive malignancy and symptomatic anemia. 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 Janese Banks as an outpt Recent PET scan day before admission - see results. Mass has invaded the right 2 upper ribs and T1. S/p CT-guided lung biopsy on 2/14 Biopsy confirms sq cell cancer  Orthostatic dizziness Improved now after hydration. PT/OT while at SNF  Chronic combined systolic and diastolic CHF (congestive heart failure) ([place]) Well compensated at this time.  Chronic anticoagulation On Xarelto for A-fib and hx of DVT and PE  History of pulmonary embolus (PE) Continue Xarelto  History of DVT (deep vein thrombosis) Continue Xarelto now as the biopsy has been performed  Gout - Continue allopurinol  Anemia of chronic disease Likely due to malignancy / chronic disease.   No signs of any bleeding.   Recent iron studies 06/18/22 - iron  low 35, normal TIBC 294, low sat ratio 12%, elevated ferritin 715.  Type 2 diabetes mellitus with diabetic polyneuropathy, without long-term current use of insulin (HCC) Atrial fibrillation, chronic (HCC) Continue  Xarelto  Essential hypertension Continue home meds  Dyslipidemia Continue statin.  Chronic venous insufficiency CVA, old, speech/language deficit Continue statin and Xarelto         Consultants: Onco Procedures performed: CT guided lung biopsy  Disposition: Skilled nursing facility Diet recommendation:  Discharge Diet Orders (From admission, onward)     Start     Ordered   07/02/22 0000  Diet - low sodium heart healthy        07/02/22 0947           Carb modified diet DISCHARGE MEDICATION: Allergies as of 07/02/2022   No Known Allergies      Medication List     STOP taking these medications    colchicine 0.6 MG tablet   lidocaine 5 % Commonly known as: Lidoderm   sulfamethoxazole-trimethoprim 400-80 MG tablet Commonly known as: BACTRIM       TAKE these medications    acetaminophen 325 MG tablet Commonly known as: TYLENOL Take 1-2 tablets (325-650 mg total) by mouth every 4 (four) hours as needed for mild pain.   albuterol 108 (90 Base) MCG/ACT inhaler Commonly known as: VENTOLIN HFA Inhale into the lungs every 6 (six) hours as needed for wheezing or shortness of breath.   allopurinol 100 MG tablet Commonly known as: ZYLOPRIM Take 100 mg by mouth daily.   bisacodyl 10 MG suppository Commonly known as: DULCOLAX Place rectally.   Calcium Carb-Cholecalciferol 600-10 MG-MCG Tabs Take 1 tablet by mouth 2 (two) times daily.   docusate sodium 100 MG capsule Commonly known as: COLACE Take 100 mg by mouth 2 (two) times daily.   FeroSul 325 (65 FE) MG tablet Generic drug: ferrous sulfate Take 0.5 tablets by mouth daily with breakfast.   furosemide 40 MG tablet Commonly known as: LASIX Take 40 mg by mouth 2 (two) times daily.   gabapentin 400 MG capsule Commonly known as: NEURONTIN Take 400 mg by mouth 3 (three) times daily.   latanoprost 0.005 % ophthalmic solution Commonly known as: XALATAN Place 1 drop into both eyes at bedtime.    losartan 50 MG tablet Commonly known as: COZAAR Take 50 mg by mouth daily.   metoprolol succinate 25 MG 24 hr tablet Commonly known as: TOPROL-XL Take 12.5 mg by mouth daily.   morphine 30 MG 12 hr tablet Commonly known as: MS CONTIN Take 1 tablet (30 mg total) by mouth every 8 (eight) hours for 3 days. What changed:  medication strength how much to take when to take this   Narcan 4 MG/0.1ML Liqd nasal spray kit Generic drug: naloxone   Oxycodone HCl 10 MG Tabs Take 1 tablet (10 mg total) by mouth every 4 (four) hours as needed for up to 3 days for breakthrough pain, severe pain or moderate pain (moderate-severe pain). What changed:  when to take this reasons to take this   potassium chloride SA 20 MEQ tablet Commonly known as: KLOR-CON M Take 1 tablet (20 mEq total) by mouth 2 (two) times daily. What changed: when to take this   predniSONE 10 MG tablet Commonly known as: DELTASONE Take 1 tablet (10 mg total) by mouth daily with breakfast.   rivaroxaban 20 MG Tabs tablet Commonly known as: XARELTO Take 1 tablet (20 mg total) by mouth daily with supper. What changed:  medication strength how much to take when to take this   Senna 8.6 MG Caps Take 2 capsules by mouth daily.   sildenafil 20 MG tablet Commonly known as: REVATIO Take 3-5 tablets 1 hour prior to intercourse as needed   simvastatin 20 MG tablet Commonly known as: ZOCOR Take 20 mg by mouth at bedtime.        Contact information for after-discharge care     Destination     [facility] Preferred SNF .   Service: Skilled Nursing Contact information: [address] [place] [phone]                    Discharge Exam: Danley Danker Weights   06/29/22 1754 07/01/22 0500 07/02/22 0500  Weight: 84.1 kg 81.8 kg 82.5 kg   General exam: awake, drowsy appearing and falls asleep easily, no acute distress HEENT: moist mucus membranes, hearing  grossly normal  Respiratory system: coarse right sided crackles, diminished breath sounds, no wheezes, rales or rhonchi, normal respiratory effort. Cardiovascular system: normal S1/S2, RRR, no pedal edema.   Gastrointestinal system: soft, NT, ND Central nervous system: A&O x self and hospital at least. dysarthric speech with mild asphasia  Extremities: BLE venous stasis changes, no edema, normal tone Skin: dry, intact, normal temperature Psychiatry: normal mood, congruent affect, judgement and insight appear normal  Condition at discharge: fair  The results of significant diagnostics from this hospitalization (including imaging, microbiology, ancillary and laboratory) are listed below for reference.   Imaging Studies: CT LUNG MASS BIOPSY  Result Date: 06/30/2022 INDICATION: Right upper lobe mass EXAM: CT-guided biopsy of right upper lobe mass MEDICATIONS: None. ANESTHESIA/SEDATION: Moderate (conscious) sedation was employed during this procedure. A total of Versed 1 mg and Fentanyl 25 mcg was administered intravenously. Moderate Sedation Time: 10 minutes. The patient's level of consciousness and vital signs were monitored continuously by radiology nursing throughout the procedure under my direct supervision. FLUOROSCOPY TIME:  N/a COMPLICATIONS: None immediate. PROCEDURE: Informed written consent was obtained from the patient after a thorough discussion of the procedural risks, benefits and alternatives. All questions were addressed. Maximal Sterile Barrier Technique was utilized including caps, mask, sterile gowns, sterile gloves, sterile drape, hand hygiene and skin antiseptic. A timeout was performed prior to the initiation of the procedure. The patient was placed prone on the exam table. Limited CT of the chest was performed for planning purposes. This demonstrated right upper lobe mass. Skin entry site was marked, and the overlying skin was prepped and draped in the standard sterile fashion.  Local analgesia was obtained with 1% lidocaine. Using intermittent CT fluoroscopy, a 17 gauge introducer needle was advanced towards the identified lesion. Subsequently, core needle biopsy was performed using an 18 gauge core biopsy device x6 total passes. Specimens were submitted in formalin to pathology for further handling. Limited postprocedure imaging demonstrated no complicating feature. The patient tolerated the procedure well, and was transferred to recovery in stable condition. IMPRESSION: Successful CT-guided core needle biopsy of right upper lobe mass. Electronically Signed   By: Albin Felling M.D.   On: 06/30/2022 15:46   MR THORACIC SPINE W WO CONTRAST  Result Date: 06/28/2022 CLINICAL DATA:  Metastatic disease evaluation. Recently diagnosed lung cancer. Known thoracic spine involvement. EXAM: MRI THORACIC WITHOUT AND WITH CONTRAST TECHNIQUE: Multiplanar and multiecho pulse sequences of the thoracic spine were obtained without and with intravenous contrast. CONTRAST:  71mL GADAVIST GADOBUTROL 1 MMOL/ML IV SOLN COMPARISON:  PET-CT 06/25/2022.  Chest CT 06/11/2022. FINDINGS: The study is moderately motion degraded. Alignment: Normal. Vertebrae: Known 8+ cm right apical lung mass with chest wall invasion and involvement of the right first and second ribs. There is extensive involvement of the majority of the T1 vertebral body and right-sided posterior elements, and there is also some involvement of the right aspect of the vertebral body and posterior elements at C7 and likely T2. Tumor extends into the right neural foramina at C7-T1 and T1-2 resulting in severe foraminal encroachment. Tumor extends to the medial aspect of the neural foramina at both levels, however there is no substantial epidural tumor within the spinal canal itself. Vertebral body heights are preserved. There is a large hemangioma in the T11 vertebral body which corresponds to the lucent lesion on CT. Cord:  Normal signal and  morphology. Paraspinal and other soft tissues: Right hilar lymphadenopathy, better characterized on the prior studies. 2.2 cm right thyroid nodule which has been present since at least a 07/12/2018 chest CT; in the setting of significant comorbidities, no follow-up recommended (ref: J Am Coll Radiol. 2015 Feb;12(2): 143-50). Disc levels: Mild thoracic disc degeneration and more widespread thoracic facet hypertrophy. Mild spinal stenosis and severe right neural foraminal stenosis at T2-3 due to severe right facet arthrosis and right-sided ligamentum flavum calcification. IMPRESSION: Known large right apical lung malignancy with extensive involvement of the T1 vertebral body and posterior elements and milder involvement of C7 and T2. Extension into the right neural foramina at C7-T1 and T1-2. No significant epidural tumor in the spinal canal itself. Electronically Signed   By: Logan Bores M.D.   On: 06/28/2022 14:52   MR BRAIN W WO CONTRAST  Result Date: 06/28/2022 CLINICAL DATA:  Metastatic disease evaluation. Recently diagnosed lung cancer. Dizziness. EXAM: MRI HEAD WITHOUT AND WITH CONTRAST TECHNIQUE: Multiplanar, multiecho pulse sequences of the brain and surrounding structures were obtained without and with intravenous contrast. CONTRAST:  61mL GADAVIST GADOBUTROL 1 MMOL/ML IV SOLN COMPARISON:  Head CT 06/25/2022 and MRI 09/12/2019. Head and neck CTA 09/12/2019. FINDINGS: Brain: There is no evidence of an acute infarct, mass, midline shift, or extra-axial fluid collection. Chronic left MCA and bilateral PCA infarcts are again noted with associated hemosiderin deposition which is greatest in the right MCA infarct. Elsewhere, small T2 hyperintensities elsewhere in the cerebral white matter and patchy T2 hyperintensity in the pons are nonspecific but compatible with mild chronic small vessel ischemic disease, similar to the prior MRI. A small chronic right cerebellar infarct is unchanged from the prior MRI.  There is mild cerebral atrophy. A cavum septum pellucidum et vergae is noted. No abnormal enhancement is identified, although postcontrast imaging is moderately motion degraded which reduces the sensitivity for detection of very small lesions. Vascular: Small distal right vertebral artery, likely with chronically diminished flow or possibly chronic occlusion. Skull and upper cervical spine: No suspicious marrow lesion. Sinuses/Orbits: Bilateral cataract extraction. Minimal mucosal thickening in the ethmoid sinuses. Clear mastoid air cells. Other: None. IMPRESSION: 1. No evidence of intracranial metastases. 2. Multiple chronic infarcts as above. Electronically Signed   By: Logan Bores M.D.   On: 06/28/2022 14:00   NM PET Image Initial (PI) Skull Base To Thigh  Result Date: 06/26/2022 CLINICAL DATA:  Initial treatment strategy for lung mass. EXAM: NUCLEAR MEDICINE PET SKULL BASE TO THIGH TECHNIQUE: 9.55 mCi F-18 FDG was injected intravenously. Full-ring PET imaging was performed from the skull base to thigh after the radiotracer. CT data was obtained and used for attenuation  correction and anatomic localization. Fasting blood glucose: 107 mg/dl COMPARISON:  CT chest 06/11/2022 FINDINGS: Mediastinal blood pool activity: SUV max 2.11 Liver activity: SUV max NA NECK: No tracer avid lymph nodes within the neck. Incidental CT findings: None. CHEST: Right apical lung mass is again noted. This measures 8.7 x 6.0 by 6.45 and has an SUV max of 23.36, image 66/2. As mentioned previously chest wall invasion is identified with a roast of changes involving the posterior aspect of the right first and second ribs as well as the T1 transverse process and right-side of the T1 vertebra. Tracer avid right hilar lymph node measures 2.1 cm within SUV max of 13.24. Incidental CT findings: Aortic atherosclerosis. Coronary artery calcifications. Mild cardiac enlargement. ABDOMEN/PELVIS: No abnormal hypermetabolic activity within the  liver, pancreas, adrenal glands, or spleen. No hypermetabolic lymph nodes in the abdomen or pelvis. Incidental CT findings: Aortic atherosclerosis. Filter identified within the IVC. Scarring, calcification in volume loss noted involving the upper pole of the left kidney. SKELETON: No focal hypermetabolic activity to suggest skeletal metastasis. Incidental CT findings: There is no significant tracer uptake associated with the previously characterized lucent bone lesion involving T11 vertebral body which has been present since 04/07/2019 and may represent a benign hemangioma. IMPRESSION: 1. Right apical lung mass is intensely FDG avid compatible with primary bronchogenic carcinoma. There is evidence for chest wall invasion with erosive changes involving posterior aspect right first and second ribs and right-side of the T1 vertebra and transverse process. The right first and second ribs as well as the T1 transverse process and right-side of the T1 vertebra. MRI of the thoracic spine may be helpful to assess for neural foraminal involvement and spinal canal involvement at this level. 2. FDG avid right hilar lymph node compatible with nodal metastasis. 3. No signs of FDG avid distant metastatic disease. 4. Aortic Atherosclerosis (ICD10-I70.0). Coronary artery calcifications. Electronically Signed   By: Kerby Moors M.D.   On: 06/26/2022 18:15   CT Head Wo Contrast  Result Date: 06/25/2022 CLINICAL DATA:  Generalized weakness.  History of bilateral CVAs. EXAM: CT HEAD WITHOUT CONTRAST TECHNIQUE: Contiguous axial images were obtained from the base of the skull through the vertex without intravenous contrast. RADIATION DOSE REDUCTION: This exam was performed according to the departmental dose-optimization program which includes automated exposure control, adjustment of the mA and/or kV according to patient size and/or use of iterative reconstruction technique. COMPARISON:  Head CT 06/11/2022. FINDINGS: Brain: Again  noted are chronic infarcts in the left frontoparietal area and in the right-greater-than-left occipital lobes, linear lacunar infarct superior right cerebellar hemisphere, and mild atrophy, small-vessel disease and atrophic ventriculomegaly with persistent cavum septi pellucidi and cavum vergae. No midline shift is seen. Benign dural calcifications are scattered along the falx. No new asymmetry is seen concerning for an acute cortical based infarct, hemorrhage, mass or mass effect. Basal cisterns are clear. There is stable mild ex vacuo prominence of the body of the left lateral ventricle and occipital horn of right lateral ventricle. Vascular: Patchy calcification noted both siphons, left distal vertebral artery. No hyperdense central vessels. Skull: Negative for fractures or focal lesions. Sinuses/Orbits: There are old lens replacements. No acute orbital findings. Mild chronic disease in the ethmoids, both maxillary sinuses. The frontal and sphenoid sinus, bilateral mastoid air cells, and middle ears are clear. Other: None. IMPRESSION: 1. No acute intracranial CT findings or interval changes. 2. 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 3. Sinus disease. Electronically Signed   By:  Telford Nab M.D.   On: 06/25/2022 21:40   DG Chest Portable 1 View  Result Date: 06/25/2022 CLINICAL DATA:  Weakness EXAM: PORTABLE CHEST 1 VIEW COMPARISON:  06/11/2022 FINDINGS: Stable cardiomegaly. Right apical mass with destructive changes of the adjacent right first and second ribs, not appreciably changed from the previous exam. Elsewhere, lungs are clear. No pleural effusion or pneumothorax. IMPRESSION: Right apical mass with destructive changes of the adjacent right first and second ribs, not appreciably changed from the previous exam. Electronically Signed   By: Davina Poke D.O.   On: 06/25/2022 21:31   CT HEAD WO CONTRAST (5MM)  Result Date: 06/11/2022 CLINICAL DATA:  Mental status change EXAM: CT HEAD WITHOUT  CONTRAST TECHNIQUE: Contiguous axial images were obtained from the base of the skull through the vertex without intravenous contrast. RADIATION DOSE REDUCTION: This exam was performed according to the departmental dose-optimization program which includes automated exposure control, adjustment of the mA and/or kV according to patient size and/or use of iterative reconstruction technique. COMPARISON:  CT 09/15/2021, 08/09/2021, 02/02/2020 FINDINGS: Brain: No acute territorial infarction, hemorrhage or intracranial mass. Multifocal chronic infarcts involving the left frontal and parietal lobes, bilateral occipital lobes and right cerebellum. Stable ex vacuo dilatation of left lateral ventricle. Vascular: No hyperdense vessels.  Carotid vascular calcification Skull: Normal. Negative for fracture or focal lesion. Sinuses/Orbits: No acute finding. Other: None IMPRESSION: 1. No CT evidence for acute intracranial abnormality. 2. Multifocal chronic infarcts.  Mild atrophy Electronically Signed   By: Donavan Foil M.D.   On: 06/11/2022 18:24   CT CHEST WO CONTRAST  Result Date: 06/11/2022 CLINICAL DATA:  Chest wall pain. EXAM: CT CHEST WITHOUT CONTRAST TECHNIQUE: Multidetector CT imaging of the chest was performed following the standard protocol without IV contrast. RADIATION DOSE REDUCTION: This exam was performed according to the departmental dose-optimization program which includes automated exposure control, adjustment of the mA and/or kV according to patient size and/or use of iterative reconstruction technique. COMPARISON:  X-ray 06/11/2022.  CT angiogram 05/11/2022 FINDINGS: Cardiovascular: Heart is slightly enlarged. No significant pericardial effusion. The thoracic aorta on this noncontrast study has some scattered atherosclerotic calcified plaque. Unchanged from previous. Coronary artery calcifications are seen. Enlargement of the main pulmonary arteries there is some. Please correlate for pulmonary artery  hypertension Mediastinum/Nodes: On this non IV contrast exam there is no specific abnormal lymph node enlargement present in the axillary region, hilum or mediastinum. There are several small less than 1 cm in size nodes identified in the mediastinum, not pathologic by size criteria. The exception is the known right-sided hilar lymph node measuring 2.5 cm in short axis which is again noted today on series 2, image 72 but less well defined without the advantage of IV contrast. Right-sided cystic thyroid nodule again noted. Lungs/Pleura: Breathing motion seen. There is some peripheral areas of scarring, fibrotic changes in both lungs as well as some reticulonodular areas. Few subpleural blebs identified in the upper lung zones. Once again there is an infiltrative mass involving the right lung apex, cupola of the lung with local bony invasion and erosive changes involving the first, second ribs as well as along the right side of the vertebral body of T1. The mass does extend into the supraclavicular soft tissues in the adjacent musculature and was better defined on the previous contrast study with a diameter of 5.1 x 3.2 cm. Attempted measurement today without the advantage of IV contrast when measured in the same fashion would be more extensive at  5.5 by 5.5 cm. Please correlate with any prior workup. Stable separate nodular area measuring 9 mm along the extreme right inferior costophrenic angle on series 3, image 148. Upper Abdomen: In the upper abdomen the adrenal glands are preserved. There is atrophy of the left kidney. Of note the transverse colon has atypical distribution with components extending anterior to the liver margin between the liver in the anterior abdominal wall. Musculoskeletal: Erosive changes involving the right first and second rib in the right T1 vertebral level as above. Is also a lucent lesion involving the T11 vertebral body worrisome for potential osseous metastatic deposit recommend further  workup. Whole-body bone scan may be useful as the next step in the workup with the other findings. IMPRESSION: Known right extreme apical soft tissue mass with local bony erosive changes and invasion of the chest wall musculature worrisome for neoplasm. Again right hilar lymph node enlargement is noted but less well defined without the advantage of IV contrast. Lucent bone lesion at the T11 vertebral body. Worrisome for additional osseous metastatic deposit. No developing pneumothorax or effusion. Enlarged heart with enlarged main pulmonary artery. Aortic Atherosclerosis (ICD10-I70.0) and Emphysema (ICD10-J43.9). Electronically Signed   By: Jill Side M.D.   On: 06/11/2022 17:02   DG Chest Portable 1 View  Result Date: 06/11/2022 CLINICAL DATA:  Right shoulder pain, assess for pneumothorax. EXAM: PORTABLE CHEST 1 VIEW COMPARISON:  Chest radiograph and CT 05/10/2022 FINDINGS: Right apical opacity corresponds to apical mass on prior CT. Destructive changes of the right first rib also seen on prior CT. No pneumothorax. Mild cardiomegaly, stable. Unchanged mediastinal contours. No acute airspace disease. No pleural fluid. No pulmonary edema. IMPRESSION: Right apical opacity corresponds to apical mass on prior CT. Destructive changes of the right first rib also seen on prior CT. No pneumothorax. Electronically Signed   By: Keith Rake M.D.   On: 06/11/2022 16:13    Microbiology: Results for orders placed or performed during the hospital encounter of 05/11/22  Resp panel by RT-PCR (RSV, Flu A&B, Covid) Anterior Nasal Swab     Status: None   Collection Time: 05/10/22  8:29 PM   Specimen: Anterior Nasal Swab  Result Value Ref Range Status   SARS Coronavirus 2 by RT PCR NEGATIVE NEGATIVE Final    Comment: (NOTE) SARS-CoV-2 target nucleic acids are NOT DETECTED.  The SARS-CoV-2 RNA is generally detectable in upper respiratory specimens during the acute phase of infection. The lowest concentration of  SARS-CoV-2 viral copies this assay can detect is 138 copies/mL. A negative result does not preclude SARS-Cov-2 infection and should not be used as the sole basis for treatment or other patient management decisions. A negative result may occur with  improper specimen collection/handling, submission of specimen other than nasopharyngeal swab, presence of viral mutation(s) within the areas targeted by this assay, and inadequate number of viral copies(<138 copies/mL). A negative result must be combined with clinical observations, patient history, and epidemiological information. The expected result is Negative.  Fact Sheet for Patients:  EntrepreneurPulse.com.au  Fact Sheet for Healthcare Providers:  IncredibleEmployment.be  This test is no t yet approved or cleared by the [place] FDA and  has been authorized for detection and/or diagnosis of SARS-CoV-2 by FDA under an Emergency Use Authorization (EUA). This EUA will remain  in effect (meaning this test can be used) for the duration of the COVID-19 declaration under Section 564(b)(1) of the Act, 21 U.S.C.section 360bbb-3(b)(1), unless the authorization is terminated  or revoked sooner.  Influenza A by PCR NEGATIVE NEGATIVE Final   Influenza B by PCR NEGATIVE NEGATIVE Final    Comment: (NOTE) The Xpert Xpress SARS-CoV-2/FLU/RSV plus assay is intended as an aid in the diagnosis of influenza from Nasopharyngeal swab specimens and should not be used as a sole basis for treatment. Nasal washings and aspirates are unacceptable for Xpert Xpress SARS-CoV-2/FLU/RSV testing.  Fact Sheet for Patients: EntrepreneurPulse.com.au  Fact Sheet for Healthcare Providers: IncredibleEmployment.be  This test is not yet approved or cleared by the [place] FDA and has been authorized for detection and/or diagnosis of SARS-CoV-2 by FDA under an Emergency Use  Authorization (EUA). This EUA will remain in effect (meaning this test can be used) for the duration of the COVID-19 declaration under Section 564(b)(1) of the Act, 21 U.S.C. section 360bbb-3(b)(1), unless the authorization is terminated or revoked.     Resp Syncytial Virus by PCR NEGATIVE NEGATIVE Final    Comment: (NOTE) Fact Sheet for Patients: EntrepreneurPulse.com.au  Fact Sheet for Healthcare Providers: IncredibleEmployment.be  This test is not yet approved or cleared by the [place] FDA and has been authorized for detection and/or diagnosis of SARS-CoV-2 by FDA under an Emergency Use Authorization (EUA). This EUA will remain in effect (meaning this test can be used) for the duration of the COVID-19 declaration under Section 564(b)(1) of the Act, 21 U.S.C. section 360bbb-3(b)(1), unless the authorization is terminated or revoked.  Performed at [facility], [address] [place], [place] [address]     Labs: CBC: Recent Labs  Lab 06/25/22 1613 06/25/22 2037 06/26/22 0448 06/26/22 1108 06/26/22 1659 06/28/22 0501 06/30/22 0328 07/01/22 0515 07/02/22 0431  WBC 6.4  --  6.5  --   --   --  5.9 6.1 5.4  NEUTROABS  --   --   --   --   --   --  4.3  --   --   HGB 9.1*   < > 9.4*   < > 9.3* 9.5* 8.5* 8.7* 8.4*  HCT 30.0*   < > 30.5*   < > 30.8* 30.8* 27.5* 28.2* 27.0*  MCV 79.6*  --  80.3  --   --   --  78.6* 78.3* 78.7*  PLT 180  --  174  --   --   --  174 184 159   < > = values in this interval not displayed.   Basic Metabolic Panel: Recent Labs  Lab 06/26/22 0448 06/27/22 0433 06/28/22 0501 06/29/22 0333 06/30/22 0328 07/01/22 0515 07/02/22 0431  NA 136   < > 132* 128* 130* 131* 133*  K 3.7   < > 4.0 3.9 4.0 4.1 4.0  CL 100   < > 97* 95* 95* 98 101  CO2 27   < > 28 25 28 26 27   GLUCOSE 104*   < > 106* 110* 106* 104* 111*  BUN 26*   < > 18 16 17 13 14   CREATININE 1.02   < > 0.96 0.93 0.90 0.85 0.82   CALCIUM 9.8   < > 9.9 9.8 10.0 10.0 9.7  MG 1.8  --   --   --   --   --   --    < > = values in this interval not displayed.   Liver Function Tests: No results for input(s): "AST", "ALT", "ALKPHOS", "BILITOT", "PROT", "ALBUMIN" in the last 168 hours. CBG: Recent Labs  Lab 06/30/22 1358  GLUCAP 104*    Discharge time spent:  greater than 30 minutes.  Signed: Max Sane, MD [facility] 07/02/2022

## 2022-07-02 NOTE — Consult Note (Signed)
ANTICOAGULATION CONSULT NOTE - Initial Consult  Pharmacy Consult for Xarelto dosing Indication:  Hx of DVT/PE and Afib  No Known Allergies  Patient Measurements: Height: 6\' 2"  (188 cm) Weight: 82.5 kg (181 lb 14.1 oz) IBW/kg (Calculated) : 82.2   Vital Signs: Temp: 98.3 F (36.8 C) (02/16 0728) BP: 118/77 (02/16 0728) Pulse Rate: 52 (02/16 0728)  Labs: Recent Labs    06/30/22 0328 07/01/22 0515 07/02/22 0431  HGB 8.5* 8.7* 8.4*  HCT 27.5* 28.2* 27.0*  PLT 174 184 159  LABPROT 15.0  --   --   INR 1.2  --   --   CREATININE 0.90 0.85 0.82     Estimated Creatinine Clearance: 93.3 mL/min (by C-G formula based on SCr of 0.82 mg/dL).   Medical History: Past Medical History:  Diagnosis Date   Arthritis    Atrial fibrillation ([place])    Cancer ([place])    Prostate   CHF (congestive heart failure) (HCC)    Diabetes mellitus without complication ([place])    Gout current and history of   History of cocaine abuse ([place])    + UDS on admission   History of deep vein thrombosis 2006   History of tobacco abuse    has smoked for 50 years   Hypertension    Pulmonary embolism ([place]) 2010   Renal infarct ([place]) 03/2019   left renal infarct   Stroke ([place]) 2017   affected speech and right side-uses cane   Varicose veins of both lower extremities    Wears dentures    full upper, partial lower    Medications:  Patient reports being on rivaroxaban 15 mg po daily at home.  Assessment: 72 yo male presented to ED with worsening weakness and right shoulder and right-sided chest pain.  Has history of Pancoast tumor of right lung.  Patient found to have RUL mass invading 1-2 ribs.  Home rivaroxaban held for biopsy.  Now that biopsy is complete pharmacy consulted to resume rivaroxaban.  Hgb 8.5>8.7>8.4 Plts 174>184>159  Goal of Therapy:  Monitor platelets by anticoagulation protocol: Yes   Plan:  -Patient has eCrCl > 50 ml/min -Continue rivaroxaban 20 mg po daily with supper -Check  CBC at least every 72 hours while inpatient.  Alison Murray, PharmD 07/02/2022,8:16 AM

## 2022-07-02 NOTE — TOC Progression Note (Signed)
Transition of Care [facility]) - Progression Note    Patient Details  Name: [name] MRN: [id number] Date of Birth: 12-10-1950  Transition of Care [facility]) CM/SW [place], RN Phone Number: 07/02/2022, 9:34 AM  Clinical Narrative:    Ins approved to go to [facility], [id number] 2/16-2/20   Expected Discharge Plan: [place] Barriers to Discharge: Insurance Authorization  Expected Discharge Plan and Services     Post Acute Care Choice: [place] Living arrangements for the past 2 months: Apartment                                       Social Determinants of Health (SDOH) Interventions SDOH Screenings   Food Insecurity: No Food Insecurity (06/26/2022)  Housing: Low Risk  (06/26/2022)  Transportation Needs: No Transportation Needs (06/26/2022)  Utilities: Not At Risk (06/26/2022)  Alcohol Screen: Low Risk  (06/21/2022)  Depression (PHQ2-9): Medium Risk (06/18/2022)  Financial Resource Strain: Low Risk  (06/21/2022)  Physical Activity: Inactive (06/21/2022)  Social Connections: Socially Isolated (06/21/2022)  Stress: Stress Concern Present (06/21/2022)  Tobacco Use: High Risk (06/26/2022)    Readmission Risk Interventions    01/29/2020   12:41 PM  Readmission Risk Prevention Plan  Transportation Screening Complete  Palliative Care Screening Not Applicable  Medication Review (RN Care Manager) Complete

## 2022-07-02 NOTE — Progress Notes (Signed)
Referral for palliative care at SNF.

## 2022-07-02 NOTE — Care Management Important Message (Signed)
Important Message  Patient Details  Name: [name] MRN: [id number] Date of Birth: October 16, 1950   Medicare Important Message Given:  N/A - LOS <3 / Initial given by admissions     [name] A Faige Seely 07/02/2022, 9:20 AM

## 2022-07-05 ENCOUNTER — Other Ambulatory Visit: Payer: Medicare Other

## 2022-07-05 DIAGNOSIS — Z515 Encounter for palliative care: Secondary | ICD-10-CM

## 2022-07-05 NOTE — Progress Notes (Signed)
TELEPHONE ENCOUNTER  SW connected via telephone with patient/spouse. SW was informed that patient discharged to [facility] on 07/02/22 from hospital.  Spouse shared that patient is doing okay so far, complains of pain. Per therapy he has good mobility and may be there for about 20 days. Spouse also share that patient seems to be in good spirits and is eating well.  SW will make PC admin aware of TOC.

## 2022-07-06 ENCOUNTER — Non-Acute Institutional Stay: Payer: Medicare Other | Admitting: Nurse Practitioner

## 2022-07-06 ENCOUNTER — Encounter: Payer: Self-pay | Admitting: Nurse Practitioner

## 2022-07-06 DIAGNOSIS — Z515 Encounter for palliative care: Secondary | ICD-10-CM

## 2022-07-06 DIAGNOSIS — C3411 Malignant neoplasm of upper lobe, right bronchus or lung: Secondary | ICD-10-CM

## 2022-07-06 DIAGNOSIS — R5381 Other malaise: Secondary | ICD-10-CM

## 2022-07-06 DIAGNOSIS — G8929 Other chronic pain: Secondary | ICD-10-CM

## 2022-07-06 NOTE — Progress Notes (Signed)
[facility] Palliative Care Consult Note Telephone: [phone]  Fax: [phone]   Date of encounter: 07/06/22 5:41 PM PATIENT NAME: [name] [address] [name] [address]   [phone] (home)  DOB: [birth year] MRN: [id number] PRIMARY CARE PROVIDER:    [facility],  [place] [address] [phone]  REFERRING PROVIDER:   [name]  RESPONSIBLE PARTY:    Contact Information     Name Relation Home Work Mobile   [name] Friend [phone]  [phone]   [name] Nephew [phone]  [phone]      I met face to face with patient in facility. Palliative Care was asked to follow this patient by consultation request of  [name] to address advance care planning and complex medical decision making. This is the initial visit.                              ASSESSMENT AND PLAN / RECOMMENDATIONS:  Symptom Management/Plan: 1. Advance Care Planning;   2. Goals of Care: Goals include to maximize quality of life and symptom management. Our advance care planning conversation included a discussion about:    The value and importance of advance care planning  Exploration of personal, cultural or spiritual beliefs that might influence medical decisions  Exploration of goals of care in the event of a sudden injury or illness  Identification and preparation of a healthcare agent  Review and updating or creation of an advance directive document. 3. Palliative care encounter; Palliative care encounter; Palliative medicine team will continue to support patient, patient's family, and medical team. Visit consisted of counseling and education dealing with the complex and emotionally intense issues of symptom management and palliative care in the setting of serious and potentially life-threatening illness  4. Chronic pain with debility due to RUL mass  invading right ribs 1-2 with Pancoast tumor of right lung. Pain mgmt and outpt onco f/up with palliative radiation and cancer treatment with current oral regimen during [name]. Continue PT/OT for debility.  Follow up Palliative Care Visit: PC f/u visit further discussion monitor trends of appetite, weights, monitor for functional, cognitive decline with chronic disease progression, assess any active symptoms, supportive role.Palliative care will continue to follow for complex medical decision making, advance care planning, and clarification of goals. Return 1 ro 2 weeks or prn.  I spent 55 minutes providing this consultation starting at 9:30 am. More than 50% of the time in this consultation was spent in counseling and care coordination. PPS: 40%  Chief Complaint: Initial palliative consult for complex medical decision making, address goals, manage ongoing symptoms  HISTORY OF PRESENT ILLNESS:  [name] is a 72 y.o. 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 Hospitalized 06/25/2022 to 07/02/2022 for generalized weakness with intractable right shoulder with right sided chest pain. Workup significant for hyponatremia, hypochloremia, Chronic pain with debility due to RUL mass invading right ribs 1-2 with Pancoast tumor of right lung. Pain mgmt and outpt onco f/up with palliative radiation and cancer treatment with current oral regimen during [name]. Continue PT/OT for debility. At present [name] resides  at home prior to hospitalization, he was [facility] to [name] where he currently resides. Staff endorses [name] has been working with therapy, ambulates  with assistance with walker. [name] has been eating, though decreased appetite when pain increases. At present Mark Serfass is lying in bed, just received pain medication and repositioned so feeling better. Purpose of today PC f/u visit further discussion monitor trends of appetite, weights, monitor for functional, cognitive decline with chronic disease progression, assess any active symptoms, supportive role. We talked about living at home, hospitalization, recent events, functional abilities, home resources, ros focusing on pain. We talked about goal with therapy, wishes d/c home. We talked about past medical history including cancer though limited, wishing to rest. Support provided, will continue to follow while at [name] with home PC to continue when d/c home. Updated staff.   History obtained from review of EMR, discussion with primary team, and interview with family, facility staff/caregiver and/or Mark. [name].  I reviewed available labs, medications, imaging, studies and related documents from the EMR.  Records reviewed and summarized above.  Physical Exam: Constitutional: NAD General: frail appearing, thin, debilitated, pleasant male ENMT: oral mucous membranes moist CV: S1S2, RRR Pulmonary: LCTA Abdomen: normo-active BS + 4 quadrants, soft and non tender MSK: +muscle wasting Skin: warm and dry Neuro:  + generalized weakness Psych: non-anxious affect, A and O x 3 CURRENT PROBLEM LIST:  Patient Active Problem List   Diagnosis Date Noted   Intractable pain 06/29/2022   Palliative care encounter 06/28/2022   Weakness 06/26/2022   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[place]) 06/11/2022   Hypercalcemia 06/11/2022   Hyponatremia 06/11/2022   Dyslipidemia 06/11/2022   History of pulmonary embolism 06/11/2022   Pancoast tumor ([place]) 05/11/2022   Right shoulder pain 05/11/2022   Biceps tendon tear 05/11/2022   History of DVT (deep vein thrombosis) 05/11/2022   History of pulmonary embolus  (PE) 05/11/2022   Chronic anticoagulation 05/11/2022   Heart failure with reduced ejection fraction ([place]) 05/11/2022   History of CVA with residual deficit 05/11/2022   Iron deficiency 05/11/2022   Chronic venous insufficiency 03/03/2022   Hyperlipidemia 03/03/2022   SOB (shortness of breath) 09/15/2021   Hypertensive urgency 09/15/2021   Pneumonia of right lower lobe due to infectious organism 08/09/2021   Elevated troponin 01/28/2020   UTI (urinary tract infection) 01/28/2020   Hypokalemia 01/28/2020   Diarrhea 01/28/2020   Chest pain 01/28/2020   Chronic combined systolic and diastolic CHF (congestive heart failure) ([place]) 32/35/5732   Acute metabolic encephalopathy 20/25/4270   Coagulation disorder ([place]) 11/29/2019   AKI (acute kidney injury) ([place])    History of cocaine abuse ([place])    Acute ischemic right PCA stroke ([place]) 09/13/2019   Vision loss, bilateral    Tobacco abuse    Bradycardia    Non-sustained ventricular tachycardia (HCC)    Orthostatic dizziness 09/12/2019   Nonintractable headache    Slurred speech    Mixed diabetic hyperlipidemia associated with type 2 diabetes mellitus ([place])    Finger joint swelling, right 06/27/2019   Bilateral hand pain 06/14/2019   PAD (peripheral artery disease) ([place]) 05/20/2019   Encounter for long-term (current) use of high-risk medication 04/30/2019   Atrial fibrillation, chronic ([place]) 04/08/2019   Fever 04/08/2019   Severe sepsis ([place]) 04/08/2019   Abdominal pain 04/07/2019   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   Polyarthralgia 03/05/2019   Thrombocytopenia ([place]) 03/05/2019   Pain due to onychomycosis of toenails of both feet  11/20/2018   Diabetic neuropathy ([place]) 11/20/2018   Influenza A 06/22/2018   Community acquired pneumonia 06/22/2018   Arthritis 05/12/2017   Varicose veins of leg with swelling, bilateral 01/18/2017   Facial twitching    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 ([place]) 02/24/2015    Pneumonia 10/12/2014   Osteoarthritis of right knee 02/11/2014   Pain and swelling of knee 01/08/2014   Pain in extremity 01/08/2014   Weakness due to cerebrovascular accident 01/08/2014   Pulmonary embolism ([place]) 06/19/2012   Gout 09/22/2011   CVA, old, speech/language deficit 09/01/2011   Cerebral infarction ([place]) 04/26/2011   Type 2 diabetes mellitus with diabetic polyneuropathy, without long-term current use of insulin ([place]) 04/26/2011   Accelerated hypertension 04/26/2011   Anemia 04/26/2011   DVT (deep venous thrombosis) ([place]) 04/26/2011   Essential hypertension 04/26/2011   PAST MEDICAL HISTORY:  Active Ambulatory Problems    Diagnosis Date Noted   Cerebral infarction ([place]) 04/26/2011   Type 2 diabetes mellitus with diabetic polyneuropathy, without long-term current use of insulin ([place]) 04/26/2011   Accelerated hypertension 04/26/2011   Anemia 04/26/2011   DVT (deep venous thrombosis) ([place]) 04/26/2011   Pneumonia 10/12/2014   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 ([place]) 02/24/2015   Facial twitching    Varicose veins of leg with swelling, bilateral 01/18/2017   Arthritis 05/12/2017   CVA, old, speech/language deficit 09/01/2011   Essential hypertension 04/26/2011   Gout 09/22/2011   Osteoarthritis of right knee 02/11/2014   Pain and swelling of knee 01/08/2014   Pain in extremity 01/08/2014   Pulmonary embolism ([place]) 06/19/2012   Weakness due to cerebrovascular accident 01/08/2014   Influenza A 06/22/2018   Community acquired pneumonia 06/22/2018   Pain due to onychomycosis of toenails of both feet 11/20/2018   Diabetic neuropathy ([place]) 11/20/2018   Abdominal pain 04/07/2019   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   Atrial fibrillation, chronic ([place]) 04/08/2019   Fever 04/08/2019   Severe sepsis ([place]) 04/08/2019   PAD (peripheral artery disease) ([place]) 05/20/2019   Orthostatic dizziness 09/12/2019   Nonintractable headache    Slurred speech     Mixed diabetic hyperlipidemia associated with type 2 diabetes mellitus ([place])    Acute ischemic right PCA stroke ([place]) 09/13/2019   Vision loss, bilateral    Tobacco abuse    Bradycardia    Non-sustained ventricular tachycardia ([place])    AKI (acute kidney injury) ([place])    History of cocaine abuse ([place])    Coagulation disorder ([name]) 11/29/2019   Elevated troponin 01/28/2020   UTI (urinary tract infection) 01/28/2020   Hypokalemia 01/28/2020   Diarrhea 01/28/2020   Chest pain 01/28/2020   Chronic combined systolic and diastolic CHF (congestive heart failure) ([place]) 47/42/5956   Acute metabolic encephalopathy 38/75/6433   Bilateral hand pain 06/14/2019   Encounter for long-term (current) use of high-risk medication 04/30/2019   Finger joint swelling, right 06/27/2019   Polyarthralgia 03/05/2019   Thrombocytopenia ([place]) 03/05/2019   Pneumonia of right lower lobe due to infectious organism 08/09/2021   SOB (shortness of breath) 09/15/2021   Hypertensive urgency 09/15/2021   Chronic venous insufficiency 03/03/2022   Hyperlipidemia 03/03/2022   Pancoast tumor ([place]) 05/11/2022   Right shoulder pain 05/11/2022   Biceps tendon tear 05/11/2022   History of DVT (deep vein thrombosis) 05/11/2022   History of pulmonary embolus (PE) 05/11/2022   Chronic anticoagulation 05/11/2022   Heart failure with reduced ejection fraction ([place]) 05/11/2022  History of CVA with residual deficit 05/11/2022   Iron deficiency 05/11/2022   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[place]) 06/11/2022   Hypercalcemia 06/11/2022   Hyponatremia 06/11/2022   Dyslipidemia 06/11/2022   History of pulmonary embolism 06/11/2022   Weakness 06/26/2022   Palliative care encounter 06/28/2022   Intractable pain 06/29/2022   Resolved Ambulatory Problems    Diagnosis Date Noted   Acute left PCA stroke ([place]) 09/18/2019   Type 2 diabetes mellitus without complications ([place]) 62/69/4854   Past Medical History:  Diagnosis Date    Atrial fibrillation (HCC)    Cancer (HCC)    CHF (congestive heart failure) ([place])    Diabetes mellitus without complication ([place])    Gout current and history of   History of deep vein thrombosis 2006   History of tobacco abuse    Hypertension    Renal infarct ([place]) 03/2019   Stroke ([place]) 2017   Varicose veins of both lower extremities    Wears dentures    SOCIAL HX:  Social History   Tobacco Use   Smoking status: Every Day    Packs/day: 0.13    Years: 50.00    Total pack years: 6.50    Types: Cigarettes   Smokeless tobacco: Never  Substance Use Topics   Alcohol use: No   FAMILY HX:  Family History  Problem Relation Age of Onset   Aneurysm Mother    Diabetes Father       ALLERGIES: No Known Allergies   PERTINENT MEDICATIONS:  Outpatient Encounter Medications as of 07/06/2022  Medication Sig   acetaminophen (TYLENOL) 325 MG tablet Take 1-2 tablets (325-650 mg total) by mouth every 4 (four) hours as needed for mild pain.   albuterol (VENTOLIN HFA) 108 (90 Base) MCG/ACT inhaler Inhale into the lungs every 6 (six) hours as needed for wheezing or shortness of breath.   allopurinol (ZYLOPRIM) 100 MG tablet Take 100 mg by mouth daily.   bisacodyl (DULCOLAX) 10 MG suppository Place rectally.   Calcium Carb-Cholecalciferol 600-10 MG-MCG TABS Take 1 tablet by mouth 2 (two) times daily.   docusate sodium (COLACE) 100 MG capsule Take 100 mg by mouth 2 (two) times daily.   ferrous sulfate (FEROSUL) 325 (65 FE) MG tablet Take 0.5 tablets by mouth daily with breakfast.   furosemide (LASIX) 40 MG tablet Take 40 mg by mouth 2 (two) times daily.   gabapentin (NEURONTIN) 400 MG capsule Take 400 mg by mouth 3 (three) times daily.   latanoprost (XALATAN) 0.005 % ophthalmic solution Place 1 drop into both eyes at bedtime.   losartan (COZAAR) 50 MG tablet Take 50 mg by mouth daily.   metoprolol succinate (TOPROL-XL) 25 MG 24 hr tablet Take 12.5 mg by mouth daily.   NARCAN 4 MG/0.1ML LIQD  nasal spray kit    potassium chloride SA (KLOR-CON M) 20 MEQ tablet Take 1 tablet (20 mEq total) by mouth 2 (two) times daily. (Patient taking differently: Take 20 mEq by mouth daily.)   predniSONE (DELTASONE) 10 MG tablet Take 1 tablet (10 mg total) by mouth daily with breakfast.   rivaroxaban (XARELTO) 20 MG TABS tablet Take 1 tablet (20 mg total) by mouth daily with supper.   Sennosides (SENNA) 8.6 MG CAPS Take 2 capsules by mouth daily.   sildenafil (REVATIO) 20 MG tablet Take 3-5 tablets 1 hour prior to intercourse as needed   simvastatin (ZOCOR) 20 MG tablet Take 20 mg by mouth at bedtime.   No facility-administered encounter medications on  file as of 07/06/2022.   Thank you for the opportunity to participate in the care of Mark. [name].  The palliative care team will continue to follow. Please call our office at [phone] if we can be of additional assistance.   Daeshawn Redmann Z Arwilda [place], NP ,

## 2022-07-07 ENCOUNTER — Non-Acute Institutional Stay: Payer: Medicare Other | Admitting: Nurse Practitioner

## 2022-07-07 ENCOUNTER — Other Ambulatory Visit: Payer: Medicare Other

## 2022-07-07 ENCOUNTER — Inpatient Hospital Stay (HOSPITAL_BASED_OUTPATIENT_CLINIC_OR_DEPARTMENT_OTHER): Payer: Medicare Other | Admitting: Hospice and Palliative Medicine

## 2022-07-07 ENCOUNTER — Telehealth: Payer: Self-pay | Admitting: *Deleted

## 2022-07-07 ENCOUNTER — Telehealth: Payer: Self-pay | Admitting: Oncology

## 2022-07-07 ENCOUNTER — Ambulatory Visit: Payer: Medicare Other | Admitting: Radiation Oncology

## 2022-07-07 ENCOUNTER — Inpatient Hospital Stay: Payer: Medicare Other | Admitting: Oncology

## 2022-07-07 DIAGNOSIS — Z515 Encounter for palliative care: Secondary | ICD-10-CM

## 2022-07-07 DIAGNOSIS — C3411 Malignant neoplasm of upper lobe, right bronchus or lung: Secondary | ICD-10-CM

## 2022-07-07 DIAGNOSIS — K5900 Constipation, unspecified: Secondary | ICD-10-CM

## 2022-07-07 DIAGNOSIS — R5381 Other malaise: Secondary | ICD-10-CM

## 2022-07-07 DIAGNOSIS — G8929 Other chronic pain: Secondary | ICD-10-CM

## 2022-07-07 IMAGING — CR DG CHEST 2V
1 series · 2 of 2 positions shown · non-contrast
Comparison: 08/09/2021

CLINICAL DATA: Short of breath, dizziness, cough

EXAM:
CHEST - 2 VIEW

[Series 1: dg chest 2 view · 0.14mm/px · 2 of 2 slices shown]
[im 1/2]
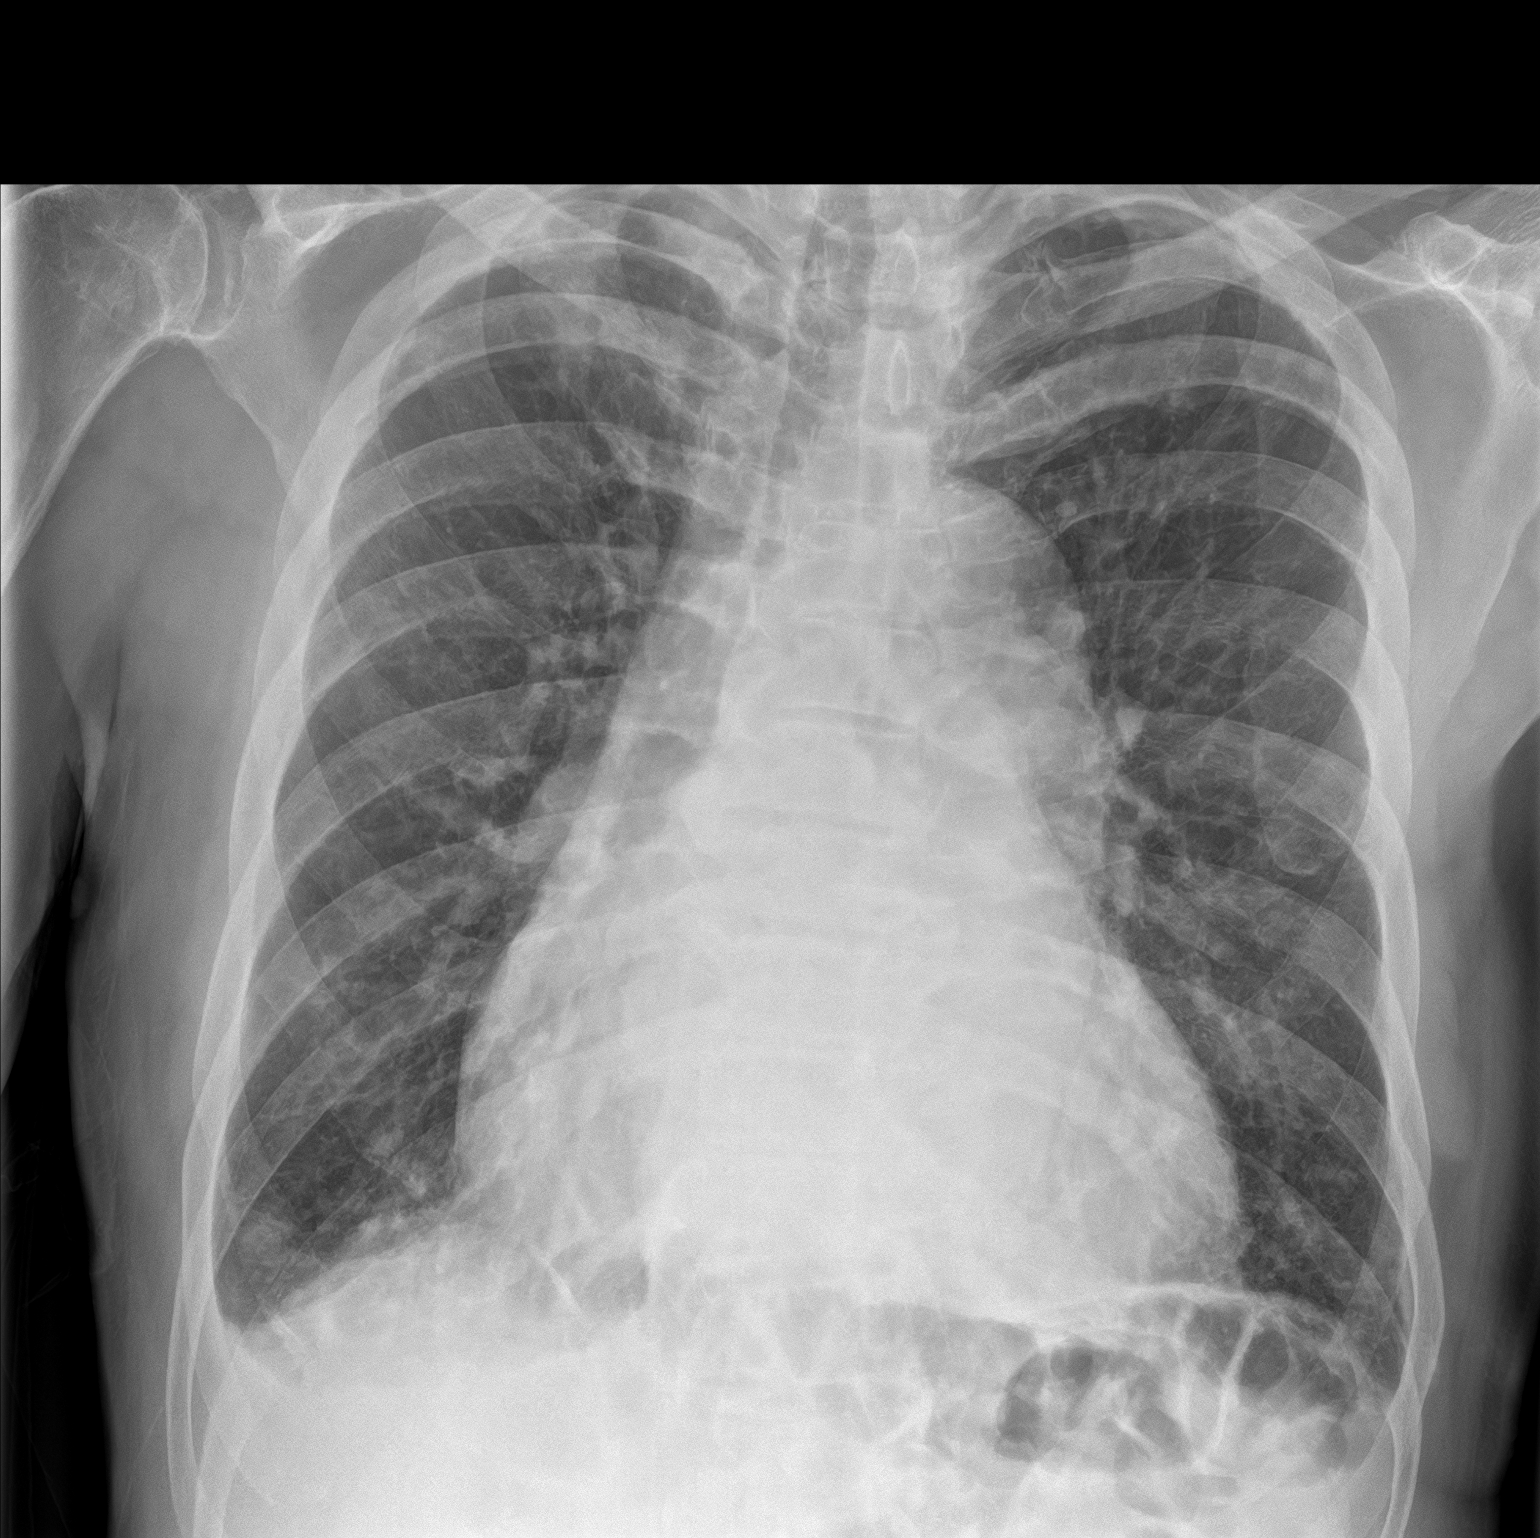
[im 2/2]
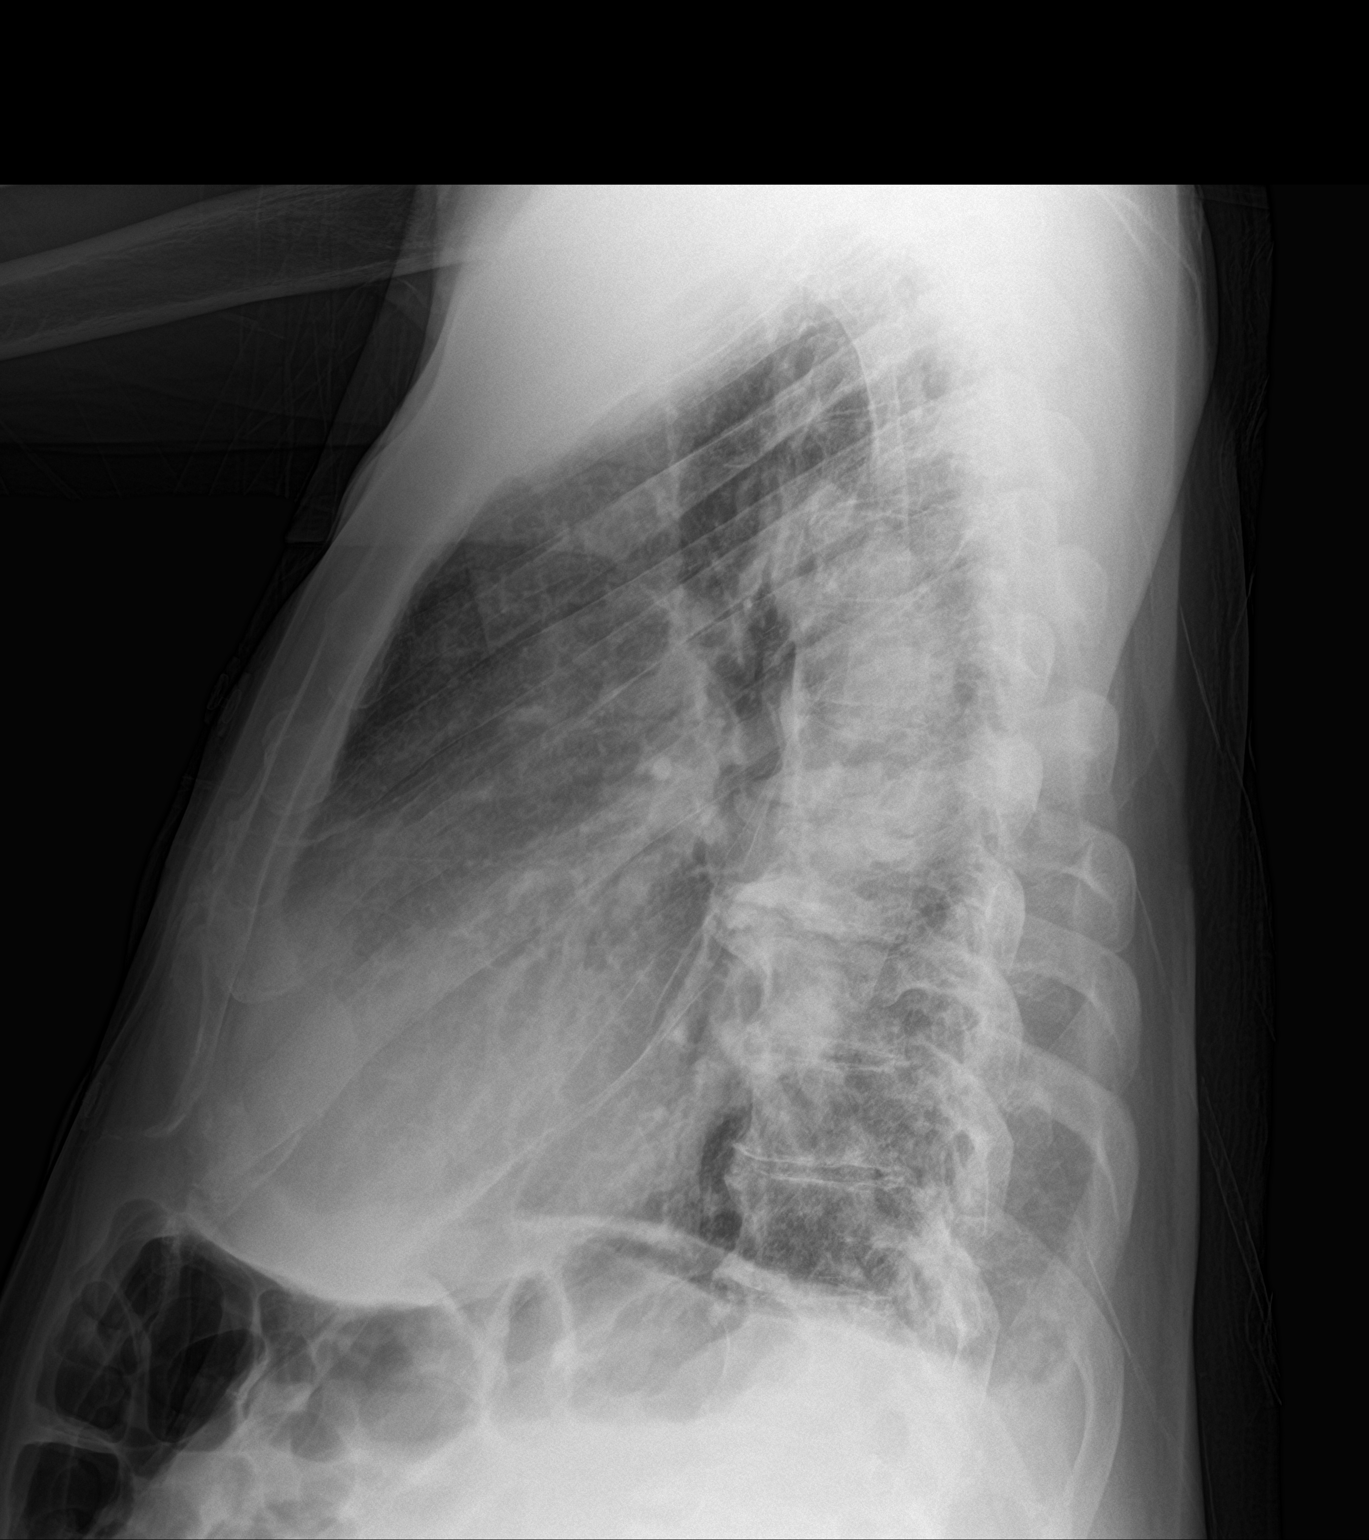

[2 of 2 positions shown; findings below may reference images not displayed]

FINDINGS: Frontal and lateral views of the chest demonstrate an enlarged
cardiac silhouette. Stable ectasia of the thoracic aorta. Mild
chronic central vascular congestion without focal airspace disease.
Trace right pleural effusion. No pneumothorax. No acute bony
abnormalities.
IMPRESSION: 1. Mild residual central vascular congestion without overt edema.
Improved volume status since prior exam.
2. Trace right pleural effusion.
3. Stable cardiomegaly.

## 2022-07-07 NOTE — Progress Notes (Signed)
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 The [place] considered available treatment options and made the following recommendations/referrals:  SW, palliative care, nutrition  The MOC is a meeting of clinicians from various specialty areas who evaluate and discuss patients for whom a multidisciplinary approach is being considered. Final determinations in the plan of care are those of the provider(s).   Today's extended care, comprehensive team conference, [place] was not present for the discussion and was not examined.

## 2022-07-07 NOTE — Telephone Encounter (Signed)
Attempted to call patient to follow up on missed appointment he does not have a voicemail set up and did not answer.

## 2022-07-07 NOTE — Progress Notes (Signed)
[facility] Consult Note Telephone: [phone]  Fax: [phone]    Date of encounter: 07/07/22 7:14 PM PATIENT NAME: [name] [address] [name] [address]   [phone] (home)  DOB: 06-11-1950 MRN: [id number] PRIMARY CARE PROVIDER:   [facility], [facility],  [place] [address] [phone]  RESPONSIBLE PARTY:    Contact Information     Name Relation Home Work Mobile   Farrel Gobble Friend [phone]  [phone]   Brokaw,Chris Nephew [phone]  [phone]     I met face to face with patient in facility. Palliative Care was asked to follow this patient by consultation request of  [facility] to address advance care planning and complex medical decision making. This is the initial visit.                              ASSESSMENT AND PLAN / RECOMMENDATIONS:  Symptom Management/Plan: 1. Advance Care Planning;   2. Goals of Care: Goals include to maximize quality of life and symptom management. Our advance care planning conversation included a discussion about:    The value and importance of advance care planning  Exploration of personal, cultural or spiritual beliefs that might influence medical decisions  Exploration of goals of care in the event of a sudden injury or illness  Identification and preparation of a healthcare agent  Review and updating or creation of an advance directive document. 3. Palliative care encounter; Palliative care encounter; Palliative medicine team will continue to support patient, patient's family, and medical team. Visit consisted of counseling and education dealing with the complex and emotionally intense issues of symptom management and palliative care in the setting of serious and potentially life-threatening illness   4. Chronic pain with debility due to RUL mass invading right ribs 1-2  with Pancoast tumor of right lung. Pain mgmt and outpt onco f/up with palliative radiation and cancer treatment with current oral regimen during STR. Continue PT/OT for debility.   5. Constipation, reviewed bm's patterns, bowel regimen; discussed with staff and will give laxative and continue stool softeners. Discussed nutrition, fluids.  Follow up Palliative Care Visit: PC f/u visit further discussion monitor trends of appetite, weights, monitor for functional, cognitive decline with chronic disease progression, assess any active symptoms, supportive role.Palliative care will continue to follow for complex medical decision making, advance care planning, and clarification of goals. Return 1 ro 2 weeks or prn.   I spent 45 minutes providing this consultation starting at 9:40 am. More than 50% of the time in this consultation was spent in counseling and care coordination. PPS: 40%   Chief Complaint: Initial palliative consult for complex medical decision making, address goals, manage ongoing symptoms   HISTORY OF PRESENT ILLNESS:  [name] is a 72 y.o. 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 Hospitalized 06/25/2022 to 07/02/2022 for generalized weakness with intractable right shoulder with right sided chest pain. Workup significant for hyponatremia, hypochloremia, Chronic pain with debility due to RUL mass invading right ribs 1-2 with Pancoast tumor of right lung. Pain mgmt and outpt onco f/up  with palliative radiation and cancer treatment with current oral regimen during STR. Continue PT/OT for debility. At present [name] resides at home prior to  hospitalization, he was d/c to [facility] for [facility] where he currently resides. Staff endorses [name] has been working with therapy, ambulates with assistance with walker. [name] has been eating, though decreased appetite when pain increases. At present [name] is lying in bed, appears comfortable, weak.  Purpose of today PC f/u visit further discussion monitor trends of appetite, weights, monitor for functional, cognitive decline with chronic disease progression, assess any active symptoms, supportive role. We talked about how he has been feeling, pain has improved with current regimen, though continues to have episodes. We talked about appetite which remains decreased. We talked about constipation, bowel patterns. We talked about increasing fiber, fluids, importance of regular bm's. We talked about therapy, with continuing to be weak, slow to progress. We talked about expectations. We talked about hope to d/c home as he is currently resides with his girlfriend. We talked about Support provided, will continue to follow while at [facility] with home PC to continue when d/c home. Updated staff.    History obtained from review of EMR, discussion with primary team, and interview with family, facility staff/caregiver and/or Mark. [name].  I reviewed available labs, medications, imaging, studies and related documents from the EMR.  Records reviewed and summarized above.  Physical Exam: Constitutional: NAD General: frail appearing, thin, debilitated, pleasant male ENMT: oral mucous membranes moist CV: S1S2, RRR Pulmonary: LCTA Abdomen: normo-active BS + 4 quadrants, soft and non tender MSK: +muscle wasting Skin: warm and dry Neuro:  + generalized weakness Psych: non-anxious affect, A and O x 3  Thank you for the opportunity to participate in the care of [name]. Please call our office at [phone] if we can be of additional assistance.   [name] Ihor Gully, NP

## 2022-07-07 NOTE — Telephone Encounter (Signed)
Called [facility] and notified them of Pt appt with DR. Janese Banks on 2/27 at [facility]

## 2022-07-08 IMAGING — CT CT HEAD W/O CM
4 series · 17 of 47 positions shown, 19 images · non-contrast
Comparison: August 09, 2021

CLINICAL DATA: Weakness and confusion.



[Series 2: head wo · axial · 0.48mm/px · z∈[-526,-402]mm · 7 of 35 slices shown, 9 images]
[im 5/35  brain]
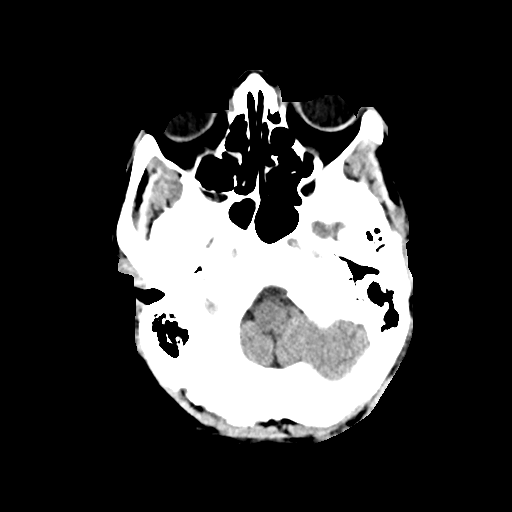
[im 5/35  bone]
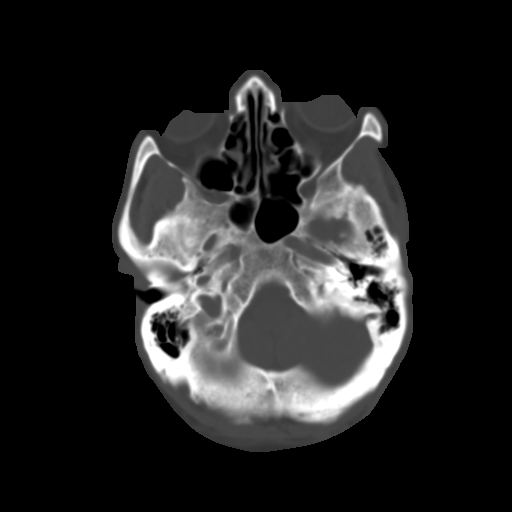
[im 9/35  brain]
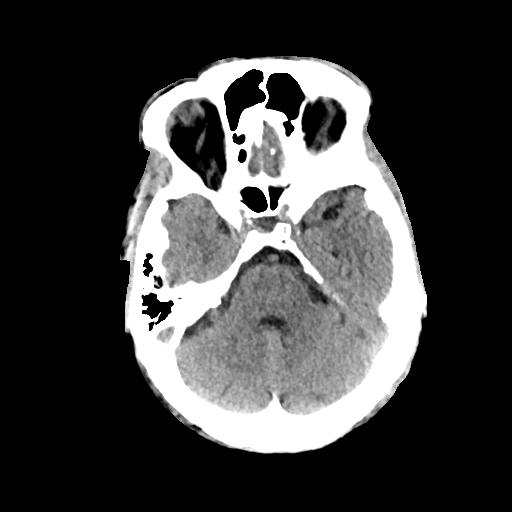
[im 13/35  brain]
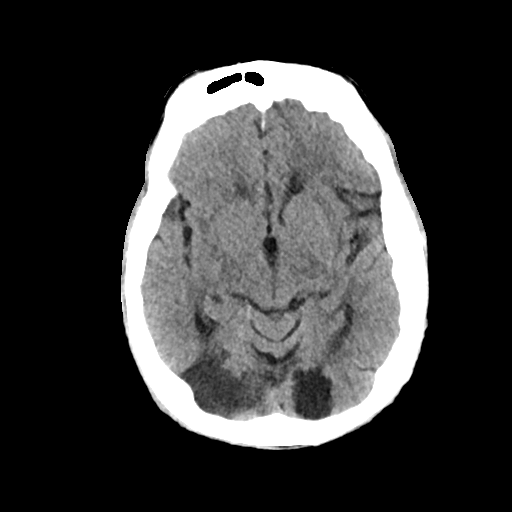
[im 18/35  brain]
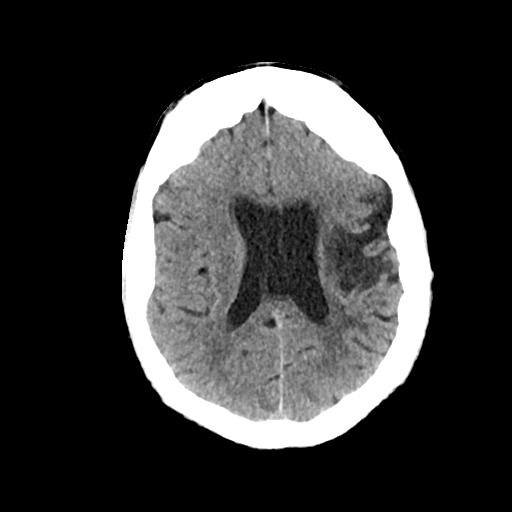
[im 22/35  brain]
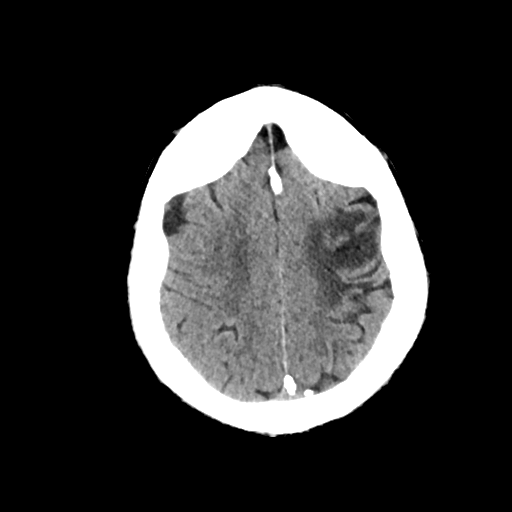
[im 22/35  bone]
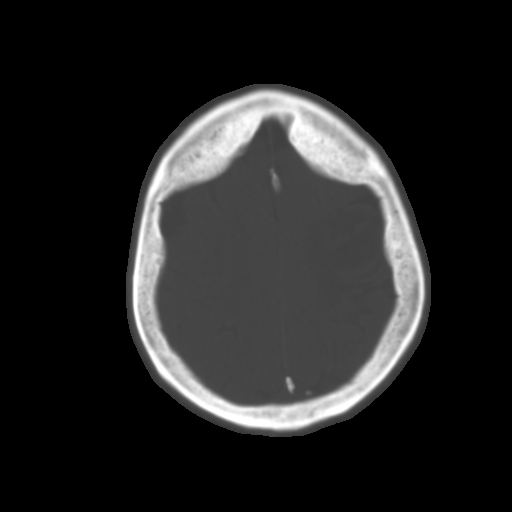
[im 26/35  brain]
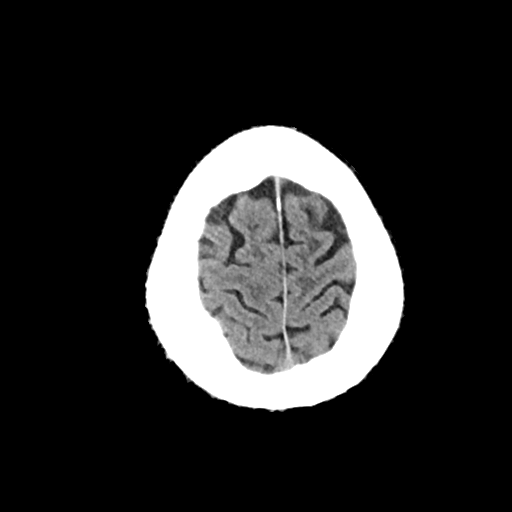
[im 30/35  brain]
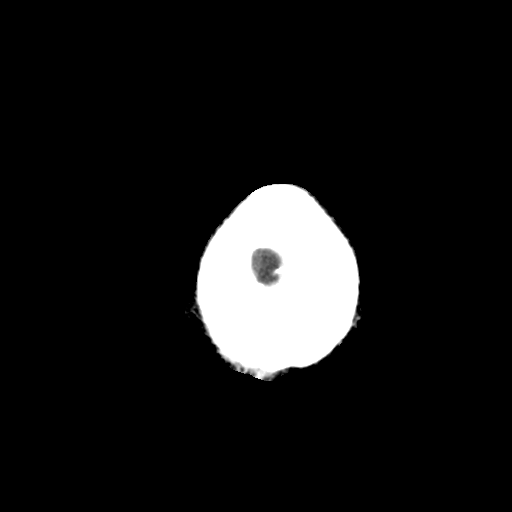

[Series 3: head bone · axial · 0.48mm/px · z∈[-530,-470]mm · 4 of 86 slices shown]
[im 9/86  bone]
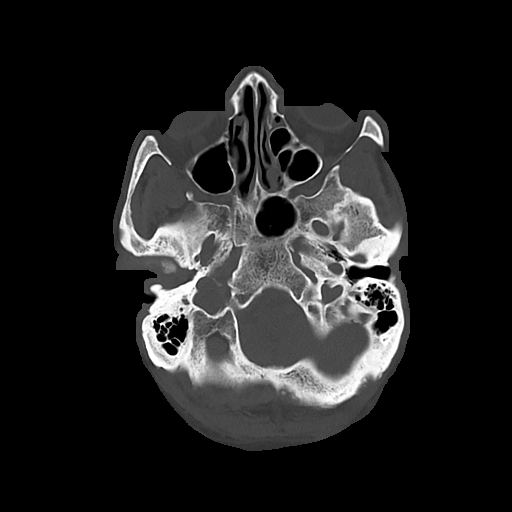
[im 18/86  bone]
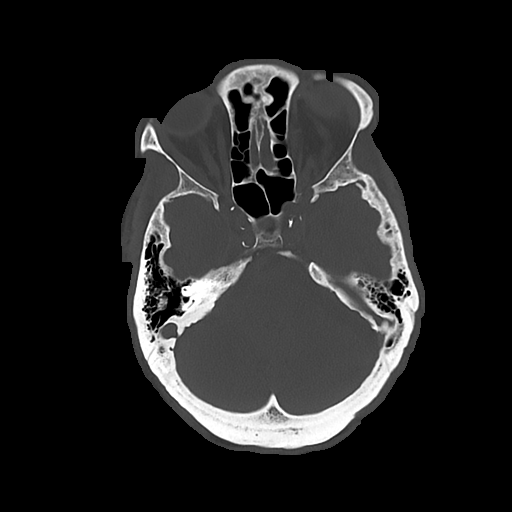
[im 26/86  bone]
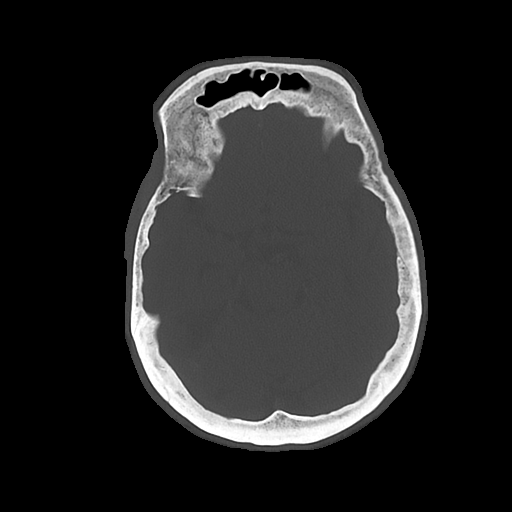
[im 39/86  bone]
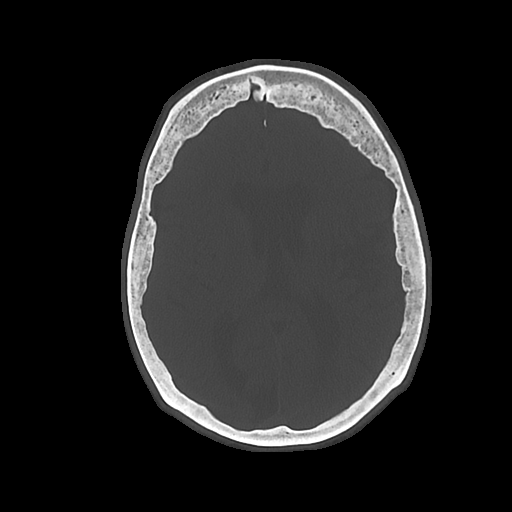

[Series 4: cor soft · coronal · 0.35mm/px · 3 of 67 slices shown]
[im 23/67  brain]
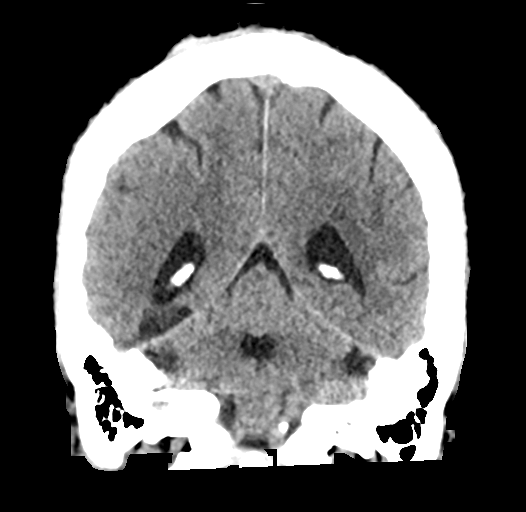
[im 30/67  brain]
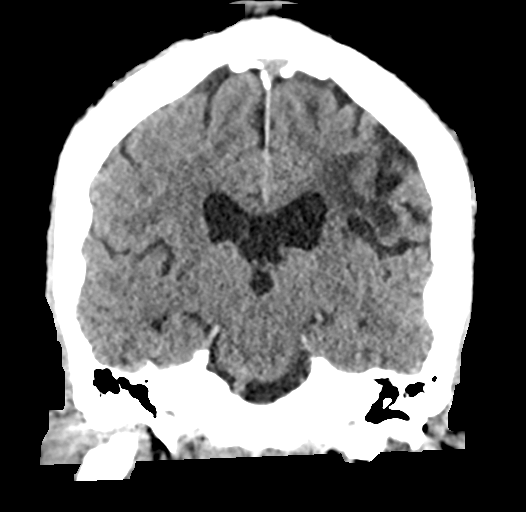
[im 37/67  brain]
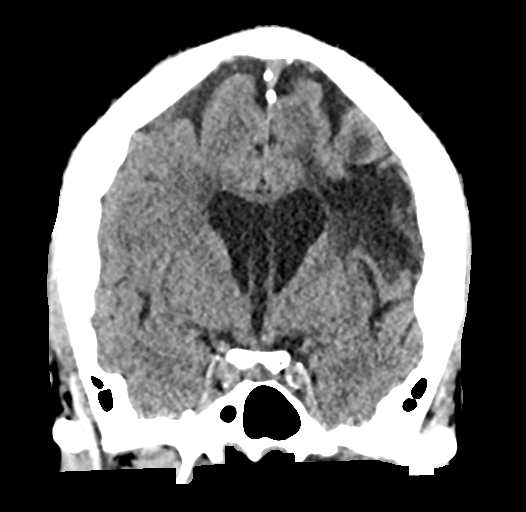

[Series 5: sag soft · sagittal · 0.36mm/px · 3 of 55 slices shown]
[im 19/55  brain]
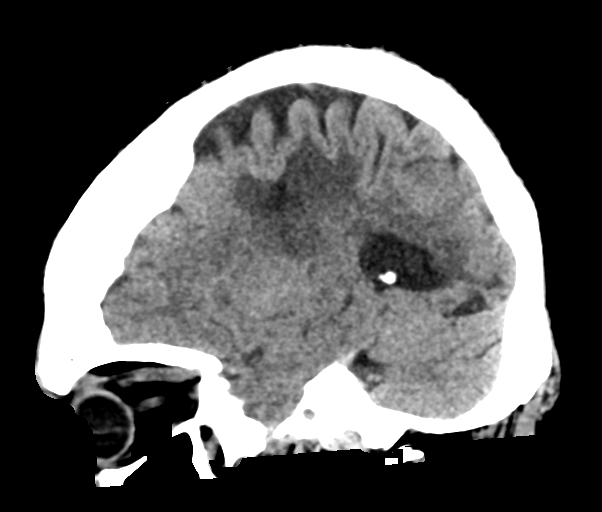
[im 28/55  brain]
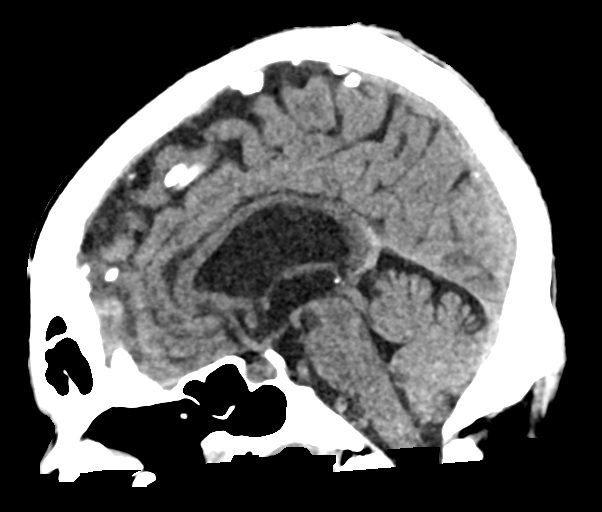
[im 37/55  brain]
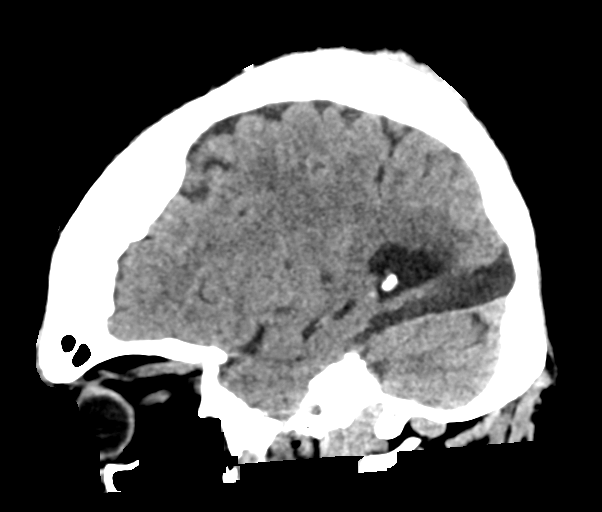

[17 of 47 positions shown; findings below may reference images not displayed]

FINDINGS: Brain: There is mild cerebral atrophy with widening of the
extra-axial spaces and ventricular dilatation.
There are areas of decreased attenuation within the white matter
tracts of the supratentorial brain, consistent with microvascular
disease changes.

Cavum septum pellucidum is noted.

Chronic left parietal lobe and chronic bilateral occipital lobe
infarcts are seen.

Vascular: No hyperdense vessel or unexpected calcification.

Skull: Normal. Negative for fracture or focal lesion.

Sinuses/Orbits: No acute finding.

Other: None.
IMPRESSION: 1. No acute intracranial abnormality.
2. Generalized cerebral atrophy with chronic left parietal lobe and
bilateral occipital lobe infarcts.

## 2022-07-13 ENCOUNTER — Ambulatory Visit
Admission: RE | Admit: 2022-07-13 | Discharge: 2022-07-13 | Disposition: A | Discharge status: Home / Self Care | Payer: Medicare Other | Source: Ambulatory Visit | Attending: Radiation Oncology | Admitting: Radiation Oncology

## 2022-07-13 ENCOUNTER — Inpatient Hospital Stay (HOSPITAL_BASED_OUTPATIENT_CLINIC_OR_DEPARTMENT_OTHER): Payer: Medicare Other | Admitting: Oncology

## 2022-07-13 ENCOUNTER — Encounter: Payer: Self-pay | Admitting: *Deleted

## 2022-07-13 ENCOUNTER — Encounter: Payer: Self-pay | Admitting: Oncology

## 2022-07-13 ENCOUNTER — Non-Acute Institutional Stay: Payer: Medicare Other | Admitting: Nurse Practitioner

## 2022-07-13 ENCOUNTER — Inpatient Hospital Stay: Payer: Medicare Other

## 2022-07-13 VITALS — BP 106/76 | HR 100 | Temp 98.5°F

## 2022-07-13 DIAGNOSIS — Z7189 Other specified counseling: Secondary | ICD-10-CM

## 2022-07-13 DIAGNOSIS — R5381 Other malaise: Secondary | ICD-10-CM

## 2022-07-13 DIAGNOSIS — G893 Neoplasm related pain (acute) (chronic): Secondary | ICD-10-CM

## 2022-07-13 DIAGNOSIS — C61 Malignant neoplasm of prostate: Secondary | ICD-10-CM

## 2022-07-13 DIAGNOSIS — C3411 Malignant neoplasm of upper lobe, right bronchus or lung: Secondary | ICD-10-CM

## 2022-07-13 DIAGNOSIS — Z515 Encounter for palliative care: Secondary | ICD-10-CM

## 2022-07-13 DIAGNOSIS — G8929 Other chronic pain: Secondary | ICD-10-CM

## 2022-07-13 DIAGNOSIS — F17210 Nicotine dependence, cigarettes, uncomplicated: Secondary | ICD-10-CM | POA: Insufficient documentation

## 2022-07-13 MED ORDER — DEXAMETHASONE 4 MG PO TABS: ORAL_TABLET | ORAL | 1 refills | Status: DC

## 2022-07-13 MED ORDER — PROCHLORPERAZINE MALEATE 10 MG PO TABS: 10.0000 mg | ORAL_TABLET | Freq: Four times a day (QID) | ORAL | 1 refills | Status: DC | PRN

## 2022-07-13 MED ORDER — OXYCODONE HCL 5 MG PO TABS
15.0000 mg | ORAL_TABLET | Freq: Once | ORAL | Status: AC
  Administered 2022-07-13: 15 mg via ORAL
  Filled 2022-07-13: qty 3

## 2022-07-13 MED ORDER — ONDANSETRON HCL 8 MG PO TABS: 8.0000 mg | ORAL_TABLET | Freq: Three times a day (TID) | ORAL | 1 refills | Status: DC | PRN

## 2022-07-13 MED ORDER — LIDOCAINE-PRILOCAINE 2.5-2.5 % EX CREA: TOPICAL_CREAM | CUTANEOUS | 3 refills | Status: DC

## 2022-07-13 NOTE — Progress Notes (Signed)
Met with patient and caregiver during follow up visit with Dr. Janese Banks. All questions answered during visit. Orders to restart pain medication and assess pain every 4 hours written on order form for [facility] to implement. Print out of appts placed in envelope for [facility] as well. Copy of appts given to pt's caregiver as well. Will arrange for port placement once pt is discharged from [facility]. Nothing further needed at this time. Instructed to call with any questions.

## 2022-07-13 NOTE — Progress Notes (Signed)
[facility] Consult Note Telephone: [phone]  Fax: [phone]    Date of encounter: 07/13/22 12:12 PM PATIENT NAME: [name] 35 [name]   [phone] (home)  DOB: Aug 30, 1950 MRN: [id number] PRIMARY CARE PROVIDER:    [name]  RESPONSIBLE PARTY:    Contact Information     Name Relation Home Work Mobile   [name] Friend [phone]  [phone]   [name] Nephew [phone]  [phone]     I met face to face with patient in facility. Palliative Care was asked to follow this patient by consultation request of  [name] to address advance care planning and complex medical decision making. This is the initial visit.                              ASSESSMENT AND PLAN / RECOMMENDATIONS:  Symptom Management/Plan: 1. Advance Care Planning;   2. Goals of Care: Goals include to maximize quality of life and symptom management. Our advance care planning conversation included a discussion about:    The value and importance of advance care planning  Exploration of personal, cultural or spiritual beliefs that might influence medical decisions  Exploration of goals of care in the event of a sudden injury or illness  Identification and preparation of a healthcare agent  Review and updating or creation of an advance directive document. 3. Palliative care encounter; Palliative care encounter; Palliative medicine team will continue to support patient, patient's family, and medical team. Visit consisted of counseling and education dealing with the complex and emotionally intense issues of symptom management and palliative care in the setting of serious and potentially life-threatening illness   4. Chronic pain with debility due to RUL mass invading right ribs 1-2 with Pancoast tumor of right lung. Pain mgmt with Oncology OP with palliative radiation  and cancer treatment with current oral regimen during [name]. Continue PT/OT for debility.    5. Constipation, reviewed bm's patterns, bowel regimen; discussed with staff and will give laxative and continue stool softeners. Discussed nutrition, fluids.  Follow up Palliative Care Visit: PC f/u visit further discussion monitor trends of appetite, weights, monitor for functional, cognitive decline with chronic disease progression, assess any active symptoms, supportive role.Palliative care will continue to follow for complex medical decision making, advance care planning, and clarification of goals. Return 1 ro 2 weeks or prn.   I spent 48 minutes providing this consultation. More than 50% of the time in this consultation was spent in counseling and care coordination. PPS: 40%   Chief Complaint: Initial palliative consult for complex medical decision making, address goals, manage ongoing symptoms   HISTORY OF PRESENT ILLNESS:  [name] is a 72 y.o. 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 Hospitalized 06/25/2022 to 07/02/2022 for generalized weakness with intractable right shoulder with right sided chest pain. Workup significant for hyponatremia, hypochloremia, Chronic pain with debility due to RUL mass invading right ribs 1-2 with Pancoast tumor of right lung. Pain mgmt and outpt onco f/up with palliative radiation and cancer treatment with current oral regimen during [name]. Continue PT/OT for  debility. At present [name] resides at home prior to hospitalization, he was d/c to [name] where he currently resides. [name] is able to propel the w/c  with his feet though requires assistance with ADL's including bathing, dressing. [name] does feed himself with declined appetite. At present [name] just returned from [facility], reviewed notes [name] brought back, he was propelling into his room with his feet. We talked about appointment, ros, functional ability. We focused on pain, constipation and nutrition for visit, education done. Praised [name] to be oob, increase self independence. We talked about goals with therapy, knowledge of cancer and ongoing support. Medications, goc, poc reviewed. Continue current plan. Updated staff. Therapeutic listening, emotional support provided. Questions answered.    History obtained from review of EMR, discussion with primary team, and interview with family, facility staff/caregiver and/or Mark. [name].  I reviewed available labs, medications, imaging, studies and related documents from the EMR.  Records reviewed and summarized above.  Physical Exam: Constitutional: NAD General: frail appearing, thin, debilitated, pleasant male ENMT: oral mucous membranes moist CV: S1S2, RRR Pulmonary: LCTA Abdomen: normo-active BS + 4 quadrants, soft and non tender MSK: +muscle wasting Skin: warm and dry Neuro:  + generalized weakness Psych: non-anxious affect, A and O x 3  Thank you for the opportunity to participate in the care of Mark. [name]. Please call our office at [phone] if we can be of additional assistance.   Mark Erkkila Ihor Gully, NP

## 2022-07-13 NOTE — Progress Notes (Signed)
START OFF PATHWAY REGIMEN - Non-Small Cell Lung   [id number]:Carboplatin AUC=2 IV D1 + Paclitaxel 45 mg/m2 IV D1 q7 Days + RT:   A cycle is every 7 days, concurrent with RT:     Paclitaxel      Carboplatin   **Always confirm dose/schedule in your pharmacy ordering system**  Patient Characteristics: Preoperative or Nonsurgical Candidate (Clinical Staging), Stage III - Nonsurgical Candidate (Nonsquamous and Squamous), PS ? 2 Therapeutic Status: Preoperative or Nonsurgical Candidate (Clinical Staging) AJCC T Category: cT4 AJCC N Category: cN1 AJCC M Category: cM0 AJCC 8 Stage Grouping: IIIA ECOG Performance Status: 2 Intent of Therapy: Curative Intent, Discussed with Patient
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
## 2022-07-14 ENCOUNTER — Ambulatory Visit
Admission: RE | Admit: 2022-07-14 | Discharge: 2022-07-14 | Disposition: A | Discharge status: Home / Self Care | Payer: Medicare Other | Source: Ambulatory Visit | Attending: Radiation Oncology | Admitting: Radiation Oncology

## 2022-07-14 ENCOUNTER — Telehealth: Payer: Self-pay | Admitting: Oncology

## 2022-07-14 DIAGNOSIS — C3411 Malignant neoplasm of upper lobe, right bronchus or lung: Secondary | ICD-10-CM | POA: Diagnosis not present

## 2022-07-14 NOTE — Addendum Note (Signed)
Addended by: Telford Nab on: 07/14/2022 10:11 AM   Modules accepted: Orders

## 2022-07-16 ENCOUNTER — Non-Acute Institutional Stay: Payer: Medicare Other | Admitting: Nurse Practitioner

## 2022-07-16 ENCOUNTER — Encounter: Payer: Self-pay | Admitting: Nurse Practitioner

## 2022-07-16 DIAGNOSIS — Z515 Encounter for palliative care: Secondary | ICD-10-CM

## 2022-07-16 DIAGNOSIS — C3411 Malignant neoplasm of upper lobe, right bronchus or lung: Secondary | ICD-10-CM

## 2022-07-16 DIAGNOSIS — G8929 Other chronic pain: Secondary | ICD-10-CM

## 2022-07-16 DIAGNOSIS — R5381 Other malaise: Secondary | ICD-10-CM

## 2022-07-16 DIAGNOSIS — K5900 Constipation, unspecified: Secondary | ICD-10-CM

## 2022-07-16 NOTE — Progress Notes (Signed)
[facility] Palliative Care Consult Note Telephone: [phone]  Fax: [phone]    Date of encounter: 07/16/22 7:30 PM PATIENT NAME: [name] [address] [name] [address]   [phone] (home)  DOB: 08/09/1950 MRN: [id number] PRIMARY CARE PROVIDER:   [facility], [facility],  [place]  [place] [address] [phone]   RESPONSIBLE PARTY:    Contact Information     Name Relation Home Work Mobile   [name] Friend [phone]  [phone]   [name] [phone]  [phone]        I met face to face with patient in facility. Palliative Care was asked to follow this patient by consultation request of  [name] to address advance care planning and complex medical decision making. This is the initial visit.                              ASSESSMENT AND PLAN / RECOMMENDATIONS:  Symptom Management/Plan: 1. Advance Care Planning;  Ongoing discussions 2. Goals of Care: Goals include to maximize quality of life and symptom management. Our advance care planning conversation included a discussion about:    The value and importance of advance care planning  Exploration of personal, cultural or spiritual beliefs that might influence medical decisions  Exploration of goals of care in the event of a sudden injury or illness  Identification and preparation of a healthcare agent  Review and updating or creation of an advance directive document. 3. Palliative care encounter; Palliative care encounter; Palliative medicine team will continue to support patient, patient's family, and medical team. Visit consisted of counseling and education dealing with the complex and emotionally intense issues of symptom management and palliative care in the setting of serious and potentially life-threatening illness   4. Chronic pain with debility due to RUL mass  invading right ribs 1-2 with Pancoast tumor of right lung. Pain mgmt with Oncology OP with palliative radiation and cancer treatment with current oral regimen during [name]. Continue PT/OT for debility.    5. Constipation, reviewed bm's patterns, bowel regimen; discussed with staff and will give laxative and continue stool softeners. Discussed nutrition, fluids.   6. Anorexia, ongoing; discussed nutrition, supplements, reviewed weights, continue to weight, continue to encourage appetite.  03/08/2022 weight 215 lbs 06/24/2022 weight 185 lbs  Follow up Palliative Care Visit: PC f/u visit further discussion monitor trends of appetite, weights, monitor for functional, cognitive decline with chronic disease progression, assess any active symptoms, supportive role.Palliative care will continue to follow for complex medical decision making, advance care planning, and clarification of goals. Return 1 ro 2 weeks or prn.   I spent 45 minutes providing this consultation. More than 50% of the time in this consultation was spent in counseling and care coordination. PPS: 40%   Chief Complaint: Initial palliative consult for complex medical decision making, address goals, manage ongoing symptoms   HISTORY OF PRESENT ILLNESS:  [name] is a 72 y.o. 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 Hospitalized 06/25/2022 to 07/02/2022 for generalized weakness with intractable right shoulder with right sided chest pain. Workup significant  for hyponatremia, hypochloremia, Chronic pain with debility due to RUL mass invading right ribs 1-2 with Pancoast tumor of right lung. Pain mgmt and outpt  onco f/up with palliative radiation and cancer treatment with current oral regimen during [name]. Continue PT/OT for debility at [facility]. [name] requires assistance for transfers though able to propel with his feet in w/c. [name] continues to require assistance with bathing, dressing, tray setup. [name] is getting close to end of [name]. At present [name] is lying in bed, endorses his pain has improved with current regimen. We talked about going to the cancer center, current treatments. We talked about his limitations functionally with increase in pain with right shoulder. We talked about his appetite, foods he likes, supplements. We talked about his work with therapy, limitations and plans to d/c home. We talked about realistic expectations and when d/c home his ability to function. Coping strategies discussed. Support provided. Medications, poc, goc reviewed. Discussed fall risks. Updated staff, will continue to monitor, follow  We talked about goals with therapy, knowledge of cancer and ongoing support. Medications, goc, poc reviewed. Continue current plan. Updated staff. Therapeutic listening, emotional support provided. Questions answered.    History obtained from review of EMR, discussion with primary team, and interview with family, facility staff/caregiver and/or Mark. [name].  I reviewed available labs, medications, imaging, studies and related documents from the EMR.  Records reviewed and summarized above.  Physical Exam: Constitutional: NAD General: frail appearing, thin, debilitated, pleasant male ENMT: oral mucous membranes moist CV: S1S2, RRR Pulmonary: LCTA Abdomen: normo-active BS + 4 quadrants, soft and non tender MSK: +muscle wasting Skin: warm and dry Neuro:  + generalized weakness Psych: non-anxious affect, A and O x 3 Thank you for the opportunity to participate in the care of Mark. [name]. Please call our office at [phone] if we can be of  additional assistance.   Mark Gartman Ihor Gully, NP

## 2022-07-19 ENCOUNTER — Other Ambulatory Visit: Payer: Self-pay

## 2022-07-19 ENCOUNTER — Inpatient Hospital Stay
Admission: EM | Admit: 2022-07-19 | Discharge: 2022-07-27 | DRG: 982 | Disposition: A | Discharge status: Home Health | Payer: 59 | Source: Other Acute Inpatient Hospital | Attending: Internal Medicine | Admitting: Internal Medicine

## 2022-07-19 ENCOUNTER — Inpatient Hospital Stay: Payer: 59

## 2022-07-19 DIAGNOSIS — G893 Neoplasm related pain (acute) (chronic): Secondary | ICD-10-CM | POA: Diagnosis present

## 2022-07-19 DIAGNOSIS — E785 Hyperlipidemia, unspecified: Secondary | ICD-10-CM | POA: Diagnosis present

## 2022-07-19 DIAGNOSIS — R52 Pain, unspecified: Secondary | ICD-10-CM

## 2022-07-19 DIAGNOSIS — I5022 Chronic systolic (congestive) heart failure: Secondary | ICD-10-CM | POA: Diagnosis present

## 2022-07-19 DIAGNOSIS — C3411 Malignant neoplasm of upper lobe, right bronchus or lung: Secondary | ICD-10-CM | POA: Diagnosis present

## 2022-07-19 DIAGNOSIS — E222 Syndrome of inappropriate secretion of antidiuretic hormone: Secondary | ICD-10-CM | POA: Diagnosis present

## 2022-07-19 DIAGNOSIS — R4182 Altered mental status, unspecified: Secondary | ICD-10-CM | POA: Diagnosis present

## 2022-07-19 DIAGNOSIS — G4730 Sleep apnea, unspecified: Secondary | ICD-10-CM | POA: Diagnosis present

## 2022-07-19 DIAGNOSIS — I69320 Aphasia following cerebral infarction: Secondary | ICD-10-CM

## 2022-07-19 DIAGNOSIS — I959 Hypotension, unspecified: Secondary | ICD-10-CM | POA: Diagnosis present

## 2022-07-19 DIAGNOSIS — Z86718 Personal history of other venous thrombosis and embolism: Secondary | ICD-10-CM

## 2022-07-19 DIAGNOSIS — K219 Gastro-esophageal reflux disease without esophagitis: Secondary | ICD-10-CM | POA: Diagnosis present

## 2022-07-19 DIAGNOSIS — I495 Sick sinus syndrome: Secondary | ICD-10-CM | POA: Diagnosis present

## 2022-07-19 DIAGNOSIS — R0789 Other chest pain: Secondary | ICD-10-CM | POA: Diagnosis present

## 2022-07-19 DIAGNOSIS — Z515 Encounter for palliative care: Secondary | ICD-10-CM | POA: Diagnosis not present

## 2022-07-19 DIAGNOSIS — I69318 Other symptoms and signs involving cognitive functions following cerebral infarction: Secondary | ICD-10-CM

## 2022-07-19 DIAGNOSIS — M25511 Pain in right shoulder: Secondary | ICD-10-CM | POA: Diagnosis present

## 2022-07-19 DIAGNOSIS — I480 Paroxysmal atrial fibrillation: Secondary | ICD-10-CM | POA: Diagnosis present

## 2022-07-19 DIAGNOSIS — E861 Hypovolemia: Secondary | ICD-10-CM | POA: Diagnosis present

## 2022-07-19 DIAGNOSIS — C7951 Secondary malignant neoplasm of bone: Secondary | ICD-10-CM | POA: Diagnosis present

## 2022-07-19 DIAGNOSIS — F17210 Nicotine dependence, cigarettes, uncomplicated: Secondary | ICD-10-CM | POA: Diagnosis present

## 2022-07-19 DIAGNOSIS — Z7901 Long term (current) use of anticoagulants: Secondary | ICD-10-CM

## 2022-07-19 DIAGNOSIS — E871 Hypo-osmolality and hyponatremia: Secondary | ICD-10-CM | POA: Diagnosis present

## 2022-07-19 DIAGNOSIS — E119 Type 2 diabetes mellitus without complications: Secondary | ICD-10-CM | POA: Diagnosis present

## 2022-07-19 DIAGNOSIS — Z833 Family history of diabetes mellitus: Secondary | ICD-10-CM

## 2022-07-19 DIAGNOSIS — Z79899 Other long term (current) drug therapy: Secondary | ICD-10-CM

## 2022-07-19 DIAGNOSIS — N179 Acute kidney failure, unspecified: Secondary | ICD-10-CM | POA: Diagnosis present

## 2022-07-19 DIAGNOSIS — I110 Hypertensive heart disease with heart failure: Secondary | ICD-10-CM | POA: Diagnosis present

## 2022-07-19 DIAGNOSIS — E860 Dehydration: Secondary | ICD-10-CM | POA: Diagnosis present

## 2022-07-19 DIAGNOSIS — I69351 Hemiplegia and hemiparesis following cerebral infarction affecting right dominant side: Secondary | ICD-10-CM

## 2022-07-19 DIAGNOSIS — I482 Chronic atrial fibrillation, unspecified: Secondary | ICD-10-CM | POA: Diagnosis present

## 2022-07-19 DIAGNOSIS — Z86711 Personal history of pulmonary embolism: Secondary | ICD-10-CM

## 2022-07-19 DIAGNOSIS — R531 Weakness: Secondary | ICD-10-CM

## 2022-07-19 LAB — CBC
HCT: 34 % — ABNORMAL LOW (ref 39.0–52.0)
Hemoglobin: 10.2 g/dL — ABNORMAL LOW (ref 13.0–17.0)
MCH: 24.3 pg — ABNORMAL LOW (ref 26.0–34.0)
MCHC: 30 g/dL (ref 30.0–36.0)
MCV: 81 fL (ref 80.0–100.0)
Platelets: 219 10*3/uL (ref 150–400)
RBC: 4.2 MIL/uL — ABNORMAL LOW (ref 4.22–5.81)
RDW: 16 % — ABNORMAL HIGH (ref 11.5–15.5)
WBC: 7.9 10*3/uL (ref 4.0–10.5)
nRBC: 0 % (ref 0.0–0.2)

## 2022-07-19 LAB — COMPREHENSIVE METABOLIC PANEL
ALT: 22 U/L (ref 0–44)
AST: 31 U/L (ref 15–41)
Albumin: 3.4 g/dL — ABNORMAL LOW (ref 3.5–5.0)
Alkaline Phosphatase: 70 U/L (ref 38–126)
Anion gap: 10 (ref 5–15)
BUN: 43 mg/dL — ABNORMAL HIGH (ref 8–23)
CO2: 26 mmol/L (ref 22–32)
Calcium: 13.3 mg/dL (ref 8.9–10.3)
Chloride: 94 mmol/L — ABNORMAL LOW (ref 98–111)
Creatinine, Ser: 1.5 mg/dL — ABNORMAL HIGH (ref 0.61–1.24)
GFR, Estimated: 49 mL/min — ABNORMAL LOW (ref >60)
Glucose, Bld: 119 mg/dL — ABNORMAL HIGH (ref 70–99)
Potassium: 5.1 mmol/L (ref 3.5–5.1)
Sodium: 130 mmol/L — ABNORMAL LOW (ref 135–145)
Total Bilirubin: 0.9 mg/dL (ref 0.3–1.2)
Total Protein: 8 g/dL (ref 6.5–8.1)

## 2022-07-19 LAB — OSMOLALITY: Osmolality: 294 mOsm/kg (ref 275–295)

## 2022-07-19 LAB — TROPONIN I (HIGH SENSITIVITY): Troponin I (High Sensitivity): 23 ng/L — ABNORMAL HIGH (ref <18)

## 2022-07-19 LAB — MAGNESIUM: Magnesium: 2.3 mg/dL (ref 1.7–2.4)

## 2022-07-19 MED ORDER — OXYCODONE HCL 5 MG PO TABS
5.0000 mg | ORAL_TABLET | ORAL | Status: AC
  Administered 2022-07-19: 5 mg via ORAL
  Filled 2022-07-19: qty 1

## 2022-07-19 MED ORDER — LIDOCAINE-PRILOCAINE 2.5-2.5 % EX CREA: TOPICAL_CREAM | Freq: Once | CUTANEOUS | Status: DC | PRN

## 2022-07-19 MED ORDER — OXYCODONE HCL 5 MG PO TABS
5.0000 mg | ORAL_TABLET | Freq: Four times a day (QID) | ORAL | Status: DC | PRN
  Administered 2022-07-20 – 2022-07-22 (×4): 5 mg via ORAL
  Filled 2022-07-19 (×4): qty 1

## 2022-07-19 MED ORDER — ONDANSETRON HCL 4 MG PO TABS: 8.0000 mg | ORAL_TABLET | Freq: Three times a day (TID) | ORAL | Status: DC | PRN

## 2022-07-19 MED ORDER — LATANOPROST 0.005 % OP SOLN
1.0000 [drp] | Freq: Every day | OPHTHALMIC | Status: DC
  Administered 2022-07-20 – 2022-07-26 (×7): 1 [drp] via OPHTHALMIC
  Filled 2022-07-19 (×3): qty 2.5

## 2022-07-19 MED ORDER — ACETAMINOPHEN 325 MG PO TABS
650.0000 mg | ORAL_TABLET | Freq: Four times a day (QID) | ORAL | Status: DC | PRN
  Administered 2022-07-20 – 2022-07-25 (×6): 650 mg via ORAL
  Filled 2022-07-19 (×7): qty 2

## 2022-07-19 MED ORDER — ACETAMINOPHEN 650 MG RE SUPP: 650.0000 mg | Freq: Four times a day (QID) | RECTAL | Status: DC | PRN

## 2022-07-19 MED ORDER — SIMVASTATIN 20 MG PO TABS
20.0000 mg | ORAL_TABLET | Freq: Every day | ORAL | Status: DC
  Administered 2022-07-20 – 2022-07-26 (×7): 20 mg via ORAL
  Filled 2022-07-19 (×7): qty 1

## 2022-07-19 MED ORDER — POLYETHYLENE GLYCOL 3350 17 G PO PACK
17.0000 g | PACK | Freq: Every day | ORAL | Status: DC | PRN
  Administered 2022-07-22: 17 g via ORAL
  Filled 2022-07-19: qty 1

## 2022-07-19 MED ORDER — CALCITONIN (SALMON) 200 UNIT/ML IJ SOLN
4.0000 [IU]/kg | Freq: Two times a day (BID) | INTRAMUSCULAR | Status: DC
  Administered 2022-07-19 – 2022-07-20 (×3): 330 [IU] via SUBCUTANEOUS
  Filled 2022-07-19 (×5): qty 1.65

## 2022-07-19 MED ORDER — ENOXAPARIN SODIUM 40 MG/0.4ML IJ SOSY
40.0000 mg | PREFILLED_SYRINGE | INTRAMUSCULAR | Status: DC
  Administered 2022-07-20: 40 mg via SUBCUTANEOUS
  Filled 2022-07-19: qty 0.4

## 2022-07-19 MED ORDER — SODIUM CHLORIDE 0.9 % IV BOLUS
1000.0000 mL | Freq: Once | INTRAVENOUS | Status: AC
  Administered 2022-07-19: 1000 mL via INTRAVENOUS

## 2022-07-19 MED ORDER — SENNA 8.6 MG PO TABS
1.0000 | ORAL_TABLET | Freq: Two times a day (BID) | ORAL | Status: DC
  Administered 2022-07-20 – 2022-07-27 (×14): 8.6 mg via ORAL
  Filled 2022-07-19 (×14): qty 1

## 2022-07-19 MED ORDER — SODIUM CHLORIDE 0.9% FLUSH
3.0000 mL | Freq: Two times a day (BID) | INTRAVENOUS | Status: DC
  Administered 2022-07-19 – 2022-07-27 (×13): 3 mL via INTRAVENOUS

## 2022-07-19 MED ORDER — ALLOPURINOL 100 MG PO TABS
100.0000 mg | ORAL_TABLET | Freq: Every day | ORAL | Status: DC
  Administered 2022-07-20 – 2022-07-27 (×7): 100 mg via ORAL
  Filled 2022-07-19 (×7): qty 1

## 2022-07-19 MED ORDER — KCL IN DEXTROSE-NACL 20-5-0.45 MEQ/L-%-% IV SOLN
INTRAVENOUS | Status: DC
  Filled 2022-07-19: qty 1000

## 2022-07-19 NOTE — ED Notes (Signed)
Pt in CT.

## 2022-07-19 NOTE — Assessment & Plan Note (Addendum)
Acute, symptomatic associated with acute kidney injury, patient has received 1 L of saline in the ER already and I have started the patient on 100 cc of per hour of high IV hydration.  Check PTH, PTH RP vitamin D and phosphorus level.  My concern is that this is due to direct invasion of bone by tumor mass.  Patient is a candidate for bisphosphonate therapy, however, I intend to defer it till AM in hope that hydration will improve his volume status and GFR and minimize renal toxicity from bisphosphonates.  I will order calcitonin for quicker onset of action till then.

## 2022-07-19 NOTE — Assessment & Plan Note (Signed)
Check serum osmolality, I am not convinced that the patient is hypovolemic therefore at this time we will have to give patient isotonic fluids trend serum sodium.  At this time hyponatremia appears to be mild
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
## 2022-07-19 NOTE — Assessment & Plan Note (Signed)
Get a CT chest as above, consider evaluation by Dr. Janese Banks in the morning if if needed.

## 2022-07-19 NOTE — Assessment & Plan Note (Signed)
Given chronic anticoagulation will get CT head for this indication, otherwise primary concern is that the patient is having metabolic encephalopathy from his hypercalcemia.  Monitor clinically at this time

## 2022-07-19 NOTE — Assessment & Plan Note (Signed)
Check urinalysis and lites

## 2022-07-19 NOTE — Assessment & Plan Note (Signed)
This is chronic, however patient is now reporting tenderness in a wide area of the right chest area both anteriorly and posteriorly, I will therefore get a CT chest in conjunction with the CT head above to reassess the tumor invasion.  Especially because patient is now having increased calcium

## 2022-07-19 NOTE — ED Notes (Signed)
Hospitalist at bedside 

## 2022-07-19 NOTE — ED Triage Notes (Addendum)
Pt came via [facility] from [facility]. Doctor at facility wanted patient to get checked out due to abnormal labs of calcium being a 14. Pt baseline is normally more of AMS with confusion and slurred speech. For EMS vitals were stable 103/60, HR 90, 100 on RA. Pt has a hx of muscular dystrophy and being very unbalanced according to EMS.

## 2022-07-19 NOTE — Assessment & Plan Note (Signed)
I have held anticoagulation of patient as above till we have CT head and clinical stability is assured after that I think patient should be restarted on his anticoagulation

## 2022-07-19 NOTE — ED Provider Notes (Signed)
[facility] Provider Note    Event Date/Time   First MD Initiated Contact with Patient 07/19/22 1943     (approximate)  History   Chief Complaint: abnormal labs  HPI  Tyra Gaither is a 72 y.o. male with a past medical history of arthritis, atrial fibrillation, CHF, diabetes, hypertension, CVA with right-sided deficits and speech difficulty, presents to the emergency department for reported elevated calcium.  Patient per report is coming [facility] for the patient had recent lab work showing a calcium level of 14 so the patient was sent for evaluation.  Here the patient is calm cooperative seems somewhat somnolent.  EMS per report states baseline the patient has slurred speech and altered mental status.  Physical Exam   Triage Vital Signs: ED Triage Vitals  Enc Vitals Group     BP 07/19/22 1925 91/68     Pulse Rate 07/19/22 1925 84     Resp 07/19/22 1925 16     Temp 07/19/22 1925 98 F (36.7 C)     Temp Source 07/19/22 1925 Oral     SpO2 07/19/22 1924 99 %     Weight 07/19/22 1926 181 lb 14.1 oz (82.5 kg)     Height 07/19/22 1926 '6\' 2"'$  (1.88 m)     Head Circumference --      Pe[facility] Flow --      Pain Score 07/19/22 1924 0     Pain Loc --      Pain Edu? --      Excl. in [place]? --     Most recent vital signs: Vitals:   07/19/22 1924 07/19/22 1925  BP:  91/68  Pulse:  84  Resp:  16  Temp:  98 F (36.7 C)  SpO2: 99% 99%    General: Awake, no distress.  CV:  Good peripheral perfusion.  Regular rate and rhythm  Resp:  Normal effort.  Equal breath sounds bilaterally.  Abd:  No distention.  Soft, nontender.  No rebound or guarding.  ED Results / Procedures / Treatments   EKG  EKG viewed and interpreted by myself shows what appears to be atrial fibrillation at 79 bpm with a narrow QRS, normal axis, normal intervals with no concerning ST changes.  Largely unchanged from prior EKG 06/25/2022.  MEDICATIONS ORDERED IN  ED: Medications  sodium chloride 0.9 % bolus 1,000 mL (has no administration in time range)     IMPRESSION / MDM / ASSESSMENT AND PLAN / ED COURSE  I reviewed the triage vital signs and the nursing notes.  Patient's presentation is most consistent with acute presentation with potential threat to life or bodily function.  Patient presents emergency department for reported hypercalcemia.  Patient appears to be at his baseline at least per EMS report.  We will check labs including a chemistry to evaluate for calcium level we will begin IV hydration while awaiting results we will also check basic labs and a urinalysis.  Patient's labs have resulted showing moderate hypercalcemia with a level of 13.3 mild renal insufficiency however this appears to be new compared to baseline and a sodium of 130.  Patient CBC shows largely normal results of chronic anemia, normal magnesium.  We will continue with IV hydration and admit to the hospital service for ongoing treatment.  FINAL CLINICAL IMPRESSION(S) / ED DIAGNOSES   Hypercalcemia Weakness Acute kidney injury  Note:  This document was prepared using Dragon voice recognition software and may include unintentional dictation  errors.   Harvest Dark, MD 07/19/22 2127

## 2022-07-19 NOTE — Assessment & Plan Note (Signed)
Hold antihypertensive agents.  Check troponin, I believe this is likely due to dehydration

## 2022-07-20 ENCOUNTER — Other Ambulatory Visit: Payer: Self-pay

## 2022-07-20 ENCOUNTER — Encounter: Payer: Self-pay | Admitting: Oncology

## 2022-07-20 LAB — BASIC METABOLIC PANEL
Anion gap: 7 (ref 5–15)
BUN: 37 mg/dL — ABNORMAL HIGH (ref 8–23)
CO2: 24 mmol/L (ref 22–32)
Calcium: 12.4 mg/dL — ABNORMAL HIGH (ref 8.9–10.3)
Chloride: 98 mmol/L (ref 98–111)
Creatinine, Ser: 1.08 mg/dL (ref 0.61–1.24)
GFR, Estimated: >60 mL/min (ref >60)
Glucose, Bld: 143 mg/dL — ABNORMAL HIGH (ref 70–99)
Potassium: 4.3 mmol/L (ref 3.5–5.1)
Sodium: 129 mmol/L — ABNORMAL LOW (ref 135–145)

## 2022-07-20 LAB — CBC
HCT: 31.8 % — ABNORMAL LOW (ref 39.0–52.0)
HCT: 31.9 % — ABNORMAL LOW (ref 39.0–52.0)
Hemoglobin: 9.6 g/dL — ABNORMAL LOW (ref 13.0–17.0)
Hemoglobin: 9.8 g/dL — ABNORMAL LOW (ref 13.0–17.0)
MCH: 24.1 pg — ABNORMAL LOW (ref 26.0–34.0)
MCH: 24.1 pg — ABNORMAL LOW (ref 26.0–34.0)
MCHC: 30.2 g/dL (ref 30.0–36.0)
MCHC: 30.7 g/dL (ref 30.0–36.0)
MCV: 79.9 fL — ABNORMAL LOW (ref 80.0–100.0)
MCV: 78.6 fL — ABNORMAL LOW (ref 80.0–100.0)
Platelets: 221 10*3/uL (ref 150–400)
Platelets: 210 10*3/uL (ref 150–400)
RBC: 3.98 MIL/uL — ABNORMAL LOW (ref 4.22–5.81)
RBC: 4.06 MIL/uL — ABNORMAL LOW (ref 4.22–5.81)
RDW: 15.9 % — ABNORMAL HIGH (ref 11.5–15.5)
RDW: 16 % — ABNORMAL HIGH (ref 11.5–15.5)
WBC: 8.2 10*3/uL (ref 4.0–10.5)
WBC: 8.6 10*3/uL (ref 4.0–10.5)
nRBC: 0 % (ref 0.0–0.2)
nRBC: 0 % (ref 0.0–0.2)

## 2022-07-20 LAB — CALCIUM: Calcium: 11.9 mg/dL — ABNORMAL HIGH (ref 8.9–10.3)

## 2022-07-20 LAB — URINALYSIS, COMPLETE (UACMP) WITH MICROSCOPIC
Bacteria, UA: NONE SEEN
Bilirubin Urine: NEGATIVE
Glucose, UA: NEGATIVE mg/dL
Hgb urine dipstick: NEGATIVE
Ketones, ur: NEGATIVE mg/dL
Leukocytes,Ua: NEGATIVE
Nitrite: NEGATIVE
Protein, ur: NEGATIVE mg/dL
Specific Gravity, Urine: 1.013 (ref 1.005–1.030)
pH: 5 (ref 5.0–8.0)

## 2022-07-20 LAB — MRSA NEXT GEN BY PCR, NASAL: MRSA by PCR Next Gen: NOT DETECTED

## 2022-07-20 LAB — APTT: aPTT: 29 seconds (ref 24–36)

## 2022-07-20 LAB — CREATININE, SERUM
Creatinine, Ser: 1.37 mg/dL — ABNORMAL HIGH (ref 0.61–1.24)
GFR, Estimated: 54 mL/min — ABNORMAL LOW (ref >60)

## 2022-07-20 LAB — PROTIME-INR
INR: 1.2 (ref 0.8–1.2)
Prothrombin Time: 14.8 seconds (ref 11.4–15.2)

## 2022-07-20 LAB — SODIUM, URINE, RANDOM: Sodium, Ur: 79 mmol/L

## 2022-07-20 LAB — VITAMIN D 25 HYDROXY (VIT D DEFICIENCY, FRACTURES): Vit D, 25-Hydroxy: 49.14 ng/mL (ref 30–100)

## 2022-07-20 LAB — PHOSPHORUS: Phosphorus: 2.8 mg/dL (ref 2.5–4.6)

## 2022-07-20 LAB — CREATININE, URINE, RANDOM: Creatinine, Urine: 90 mg/dL

## 2022-07-20 MED ORDER — SENNA 8.6 MG PO TABS: 17.2000 mg | ORAL_TABLET | Freq: Every day | ORAL | Status: DC

## 2022-07-20 MED ORDER — METOPROLOL TARTRATE 5 MG/5ML IV SOLN: 5.0000 mg | INTRAVENOUS | Status: DC | PRN

## 2022-07-20 MED ORDER — HYDRALAZINE HCL 20 MG/ML IJ SOLN: 10.0000 mg | INTRAMUSCULAR | Status: DC | PRN

## 2022-07-20 MED ORDER — GUAIFENESIN 100 MG/5ML PO LIQD
5.0000 mL | ORAL | Status: DC | PRN
  Administered 2022-07-20: 5 mL via ORAL
  Filled 2022-07-20: qty 10

## 2022-07-20 MED ORDER — SODIUM CHLORIDE 0.9 % IV SOLN: INTRAVENOUS | Status: AC

## 2022-07-20 MED ORDER — SENNOSIDES-DOCUSATE SODIUM 8.6-50 MG PO TABS: 1.0000 | ORAL_TABLET | Freq: Every evening | ORAL | Status: DC | PRN

## 2022-07-20 MED ORDER — ENOXAPARIN SODIUM 60 MG/0.6ML IJ SOSY: 42.5000 mg | PREFILLED_SYRINGE | Freq: Once | INTRAMUSCULAR | Status: DC

## 2022-07-20 MED ORDER — ONDANSETRON HCL 4 MG/2ML IJ SOLN: 4.0000 mg | Freq: Four times a day (QID) | INTRAMUSCULAR | Status: DC | PRN

## 2022-07-20 MED ORDER — DOCUSATE SODIUM 100 MG PO CAPS
100.0000 mg | ORAL_CAPSULE | Freq: Two times a day (BID) | ORAL | Status: DC
  Administered 2022-07-20 – 2022-07-27 (×14): 100 mg via ORAL
  Filled 2022-07-20 (×14): qty 1

## 2022-07-20 MED ORDER — TRAZODONE HCL 50 MG PO TABS
50.0000 mg | ORAL_TABLET | Freq: Every evening | ORAL | Status: DC | PRN
  Administered 2022-07-20 – 2022-07-27 (×5): 50 mg via ORAL
  Filled 2022-07-20 (×5): qty 1

## 2022-07-20 MED ORDER — ENOXAPARIN SODIUM 100 MG/ML IJ SOSY
1.0000 mg/kg | PREFILLED_SYRINGE | Freq: Two times a day (BID) | INTRAMUSCULAR | Status: DC
  Administered 2022-07-20 – 2022-07-22 (×4): 82.5 mg via SUBCUTANEOUS
  Filled 2022-07-20 (×4): qty 0.82

## 2022-07-20 MED ORDER — FERROUS SULFATE 325 (65 FE) MG PO TABS
325.0000 mg | ORAL_TABLET | Freq: Every day | ORAL | Status: DC
  Administered 2022-07-21 – 2022-07-27 (×6): 325 mg via ORAL
  Filled 2022-07-20 (×6): qty 1

## 2022-07-20 MED ORDER — IPRATROPIUM-ALBUTEROL 0.5-2.5 (3) MG/3ML IN SOLN: 3.0000 mL | RESPIRATORY_TRACT | Status: DC | PRN

## 2022-07-20 NOTE — Progress Notes (Signed)
This Chap visited with the Pt in response to [place]. Pt expressed a desire for a prayer which this Melven Sartorius was more than happy to offer.   07/20/22 1300  Spiritual Encounters  Type of Visit Initial  Care provided to: Patient  Conversation partners present during encounter Nurse  Referral source Nurse (RN/NT/LPN)  Reason for visit Religious ritual  OnCall Visit No  Spiritual Framework  Presenting Themes Caregiving needs  Interventions  Spiritual Care Interventions Made Prayer  Intervention Outcomes  Outcomes Reduced anxiety;Reduced isolation  Spiritual Care Plan  Spiritual Care Issues Still Outstanding No further spiritual care needs at this time (see row info)

## 2022-07-20 NOTE — Progress Notes (Addendum)
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  Patient was found to have hypercalcemia complicated by acute kidney injury, hyponatremia and pain and discomfort.   Assessment & Plan:  Principal Problem:   Hypercalcemia Active Problems:   Right shoulder pain   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HCC)   Hyponatremia   AKI (acute kidney injury) ([place])   History of DVT (deep vein thrombosis)   History of pulmonary embolus (PE)   AMS (altered mental status)   Hypotension     Assessment and Plan: * Hypercalcemia Hypercalcemia seems to be improving with aggressive IV fluids and Calcitonin but given his malignancy with bony invasion, I suspect he may have recurrence therefore we will end up requiring some form of bisphosphonates especially while undergoing radiation treatment.  If necessary will end up giving him calcitonin/bisphosphonates in the hospital.  Will consult oncology, Dr. Janese Banks and Rad Onc, Dr Eulas Post. Follows with both outpatient.  Chemo was to start on March 11th, 24.   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 Janese Banks  Right shoulder pain Appears to be chronic secondary to bony and T1 invasion.  AKI (acute kidney injury) ([place]) Baseline creatinine 0.8.  Admission creatinine 1.5, improving with IV fluids.  History of pulmonary embolus (PE); 2018 & 2023 Will place him on Lovenox '1mg'$ /kg. Resume Xarelto upon dc.   Hypotension IVF  AMS (altered mental status); Improved.  CT head is negative. Will monitor.   Chronic A fib CHF ef 50% On  Xarelto at home.       DVT prophylaxis: Lovenox '1mg'$ /kg Code Status: Full  Family Communication:    Status is: Inpatient Getting treatment for HyperCa    Subjective: Overall very weak. His rib pain has progressively become worse of the past few days.    Examination:  General exam: Appears calm and comfortable, cachectic frail.  Respiratory system: bibasilar crackles.  Cardiovascular system: S1 & S2 heard, RRR. No JVD, murmurs, rubs, gallops or clicks.  Gastrointestinal system: Abdomen is nondistended, soft and nontender. No organomegaly or masses felt. Normal bowel sounds heard. Central nervous system: Alert and oriented. No focal neurological deficits. Extremities: Symmetric 5 x 5 power. Skin: No rashes, lesions or ulcers Psychiatry: Judgement and insight appear normal. Mood & affect appropriate.     Objective: Vitals:   07/20/22 0700 07/20/22 0730 07/20/22 0800 07/20/22 0815  BP: (!) 142/98 108/75 115/76   Pulse: (!) 137 (!) 51  69  Resp: '12 18 19 20  '$ Temp:   98 F (36.7 C)   TempSrc:   Oral   SpO2: 100% 94%  94%  Weight:      Height:        Intake/Output Summary (Last 24 hours) at 07/20/2022 0848 Last data filed at 07/20/2022 0643 Gross per 24 hour  Intake --  Output 150 ml  Net -150 ml   Filed Weights   07/19/22 1926  Weight: 82.5 kg     Data Reviewed:   CBC: Recent Labs  Lab 07/19/22 1946 07/19/22 2319 07/20/22 0541  WBC 7.9 8.2 8.6  HGB 10.2* 9.6* 9.8*  HCT 34.0* 31.8* 31.9*  MCV 81.0 79.9* 78.6*  PLT 219 221 [id number]   Basic Metabolic Panel: Recent Labs  Lab 07/19/22 1946 07/19/22 2319 07/20/22 0541  NA 130*  --  129*  K 5.1  --  4.3  CL 94*  --  98  CO2 26  --  24  GLUCOSE 119*  --  143*  BUN 43*  --  37*  CREATININE 1.50* 1.37* 1.08  CALCIUM 13.3*  --  12.4*  MG 2.3  --   --   PHOS  --  2.8  --    GFR: Estimated Creatinine Clearance: 70.8 mL/min (by C-G formula based on SCr of 1.08 mg/dL). Liver Function Tests: Recent Labs   Lab 07/19/22 1946  AST 31  ALT 22  ALKPHOS 70  BILITOT 0.9  PROT 8.0  ALBUMIN 3.4*   No results for input(s): "LIPASE", "AMYLASE" in the last 168 hours. No results for input(s): "AMMONIA" in the last 168 hours. Coagulation Profile: Recent Labs  Lab 07/19/22 2319  INR 1.2   Cardiac Enzymes: No results for input(s): "CKTOTAL", "CKMB", "CKMBINDEX", "TROPONINI" in the last 168 hours. BNP (last 3 results) No results for input(s): "PROBNP" in the last 8760 hours. HbA1C: No results for input(s): "HGBA1C" in the last 72 hours. CBG: No results for input(s): "GLUCAP" in the last 168 hours. Lipid Profile: No results for input(s): "CHOL", "HDL", "LDLCALC", "TRIG", "CHOLHDL", "LDLDIRECT" in the last 72 hours. Thyroid Function Tests: No results for input(s): "TSH", "T4TOTAL", "FREET4", "T3FREE", "THYROIDAB" in the last 72 hours. Anemia Panel: No results for input(s): "VITAMINB12", "FOLATE", "FERRITIN", "TIBC", "IRON", "RETICCTPCT" in the last 72 hours. Sepsis Labs: No results for input(s): "PROCALCITON", "LATICACIDVEN" in the last 168 hours.  No results found for this or any previous visit (from the past 240 hour(s)).       Radiology Studies: CT CHEST WO CONTRAST  Result Date: 07/19/2022 CLINICAL DATA:  Altered mental status, and abnormal labs with low calcium. 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 EXAM: CT HEAD WITHOUT CONTRAST CT CHEST WITHOUT CONTRAST TECHNIQUE: Contiguous axial images were obtained from the base of the skull through the vertex without intravenous contrast, with multiplanar reconstructions. Multidetector CT imaging of the chest was performed following the standard protocol without IV contrast, with multiplanar reconstructions. RADIATION DOSE REDUCTION: This exam was performed according to the departmental dose-optimization program which includes automated exposure control, adjustment of the mA and/or kV according  to patient size and/or use of iterative reconstruction technique. COMPARISON:  CT scan head 06/25/2022, PET-CT 06/25/2022, chest CT without contrast 06/11/2022. FINDINGS: CT HEAD FINDINGS Brain: Again noted are chronic bilateral occipital lobe infarcts, and encephalomalacia due to a chronic left frontoparietal infarct. A linear lacunar infarct is also again noted superiorly in the right cerebellar hemisphere. No new asymmetry is seen concerning for a cortical based acute infarct, hemorrhage, mass effect or shift of the midline. There is mild atrophy, small-vessel disease and atrophic ventriculomegaly with persistent cavum septi pellucidi and cavum vergae. There are benign dural calcifications along the falx. Vascular: There are calcifications in both siphons, distal vertebral arteries. No hyperdense central vessels. Skull: Negative for fractures or focal lesions. Sinuses/Orbits: Old lens replacements. Otherwise negative orbits. There is mild membrane disease in the right ethmoid air cells. Other visible sinuses, mastoid air cells and middle ears are clear. Other: None. CT CHEST FINDINGS Cardiovascular: There is three-vessel coronary artery calcification. Aortic atherosclerosis and tortuosity without evidence of aneurysm. There  is mild-to-moderate cardiomegaly. No pericardial effusion. Enlarged pulmonary trunk 4 cm indicating arterial hypertension. The pulmonary veins are decompressed. There are scattered calcifications in the great vessels. Mediastinum/Nodes: Previously tracer avid right mid hilar lymph node just above the distal right main pulmonary artery, today is estimated at 2.4 cm in short axis, previously 2.1 cm. There are stable shotty subcentimeter in short axis right paratracheal, pretracheal and subcarinal nodes. No other enlarged nodes are visible without contrast. Lungs/Pleura: There is again noted an ill-defined right apical invasive lung mass, with extension into the supraclavicular fossa, difficult  to accurately measure without the benefit of IV contrast. It is not fully imaged at its upper extent but has enlarged in the AP and transverse axis. Today this measures 9 x 7.5 cm coronal and AP, previously 8.7 x 6 cm. There are increased destructive changes especially to the right first rib posterior and lateral portions, increased erosive change to the posterior right second and third ribs, and increased erosion into the right side of the T1 vertebral body, the right T1 pedicle and the right T1 transverse process and right side of the dorsal T1 arch. Associated with this, there is increased tumor encroachment on the right anterior aspect of the thecal sac at T1 and on the right T1-2 exit foramen. The lungs are mildly emphysematous with paraseptal changes predominating. There are asymmetric pleural-parenchymal scar-like opacities underlying the right apical lung mass, and bilaterally scattered areas of subpleural reticulation. There is a calcified granuloma in the lingula. Stable 1.5 cm pleural-based nodule in the posteromedial extreme right base on 5:143. Remaining lungs are generally clear. No pneumonic process is seen. There are retained or aspirated secretions in the proximal right main bronchus. Upper abdomen: No mass or acute abnormality is seen. Musculoskeletal: See above regarding tumor erosive changes to the right first, second and third ribs and right side of T1. There is osteopenia with degenerative changes of the spine. No other destructive bone lesions are seen. There are degenerative changes of the thoracic spine and multilevel bridging enthesopathy. IMPRESSION: 1. No acute intracranial CT findings or interval changes. Old infarcts. Chronic change. 2. 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 3. Increased size of  a right mid hilar lymph node. 4. Stable 1.5 cm pleural-based nodule in the posteromedial extreme right base. 5. Aortic and coronary artery atherosclerosis. 6. Emphysema. 7. Retained or aspirated secretions in the proximal right main bronchus. Aortic Atherosclerosis (ICD10-I70.0) and Emphysema (ICD10-J43.9). Electronically Signed   By: Telford Nab M.D.   On: 07/19/2022 23:34   CT HEAD WO CONTRAST (5MM)  Result Date: 07/19/2022 CLINICAL DATA:  Altered mental status, and abnormal labs with low calcium. 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 EXAM: CT HEAD WITHOUT CONTRAST CT CHEST WITHOUT CONTRAST TECHNIQUE: Contiguous axial images were obtained from the base of the skull through the vertex without intravenous contrast, with multiplanar reconstructions. Multidetector CT imaging of the chest was performed following the standard protocol without IV contrast, with multiplanar reconstructions. RADIATION DOSE REDUCTION: This exam was performed according to the departmental dose-optimization program which includes automated exposure control, adjustment of the mA and/or kV according to patient size and/or use of iterative reconstruction technique. COMPARISON:  CT scan head 06/25/2022, PET-CT 06/25/2022, chest CT without contrast 06/11/2022. FINDINGS: CT  HEAD FINDINGS Brain: Again noted are chronic bilateral occipital lobe infarcts, and encephalomalacia due to a chronic left frontoparietal infarct. A linear lacunar infarct is also again noted superiorly in the right cerebellar hemisphere. No new asymmetry is seen concerning for a cortical based acute infarct, hemorrhage, mass effect or shift of the midline. There is mild atrophy, small-vessel disease and atrophic ventriculomegaly with persistent cavum septi pellucidi and cavum vergae. There are benign dural calcifications along the falx. Vascular: There are calcifications in both siphons, distal vertebral arteries. No  hyperdense central vessels. Skull: Negative for fractures or focal lesions. Sinuses/Orbits: Old lens replacements. Otherwise negative orbits. There is mild membrane disease in the right ethmoid air cells. Other visible sinuses, mastoid air cells and middle ears are clear. Other: None. CT CHEST FINDINGS Cardiovascular: There is three-vessel coronary artery calcification. Aortic atherosclerosis and tortuosity without evidence of aneurysm. There is mild-to-moderate cardiomegaly. No pericardial effusion. Enlarged pulmonary trunk 4 cm indicating arterial hypertension. The pulmonary veins are decompressed. There are scattered calcifications in the great vessels. Mediastinum/Nodes: Previously tracer avid right mid hilar lymph node just above the distal right main pulmonary artery, today is estimated at 2.4 cm in short axis, previously 2.1 cm. There are stable shotty subcentimeter in short axis right paratracheal, pretracheal and subcarinal nodes. No other enlarged nodes are visible without contrast. Lungs/Pleura: There is again noted an ill-defined right apical invasive lung mass, with extension into the supraclavicular fossa, difficult to accurately measure without the benefit of IV contrast. It is not fully imaged at its upper extent but has enlarged in the AP and transverse axis. Today this measures 9 x 7.5 cm coronal and AP, previously 8.7 x 6 cm. There are increased destructive changes especially to the right first rib posterior and lateral portions, increased erosive change to the posterior right second and third ribs, and increased erosion into the right side of the T1 vertebral body, the right T1 pedicle and the right T1 transverse process and right side of the dorsal T1 arch. Associated with this, there is increased tumor encroachment on the right anterior aspect of the thecal sac at T1 and on the right T1-2 exit foramen. The lungs are mildly emphysematous with paraseptal changes predominating. There are  asymmetric pleural-parenchymal scar-like opacities underlying the right apical lung mass, and bilaterally scattered areas of subpleural reticulation. There is a calcified granuloma in the lingula. Stable 1.5 cm pleural-based nodule in the posteromedial extreme right base on 5:143. Remaining lungs are generally clear. No pneumonic process is seen. There are retained or aspirated secretions in the proximal right main bronchus. Upper abdomen: No mass or acute abnormality is seen. Musculoskeletal: See above regarding tumor erosive changes to the right first, second and third ribs and right side of T1. There is osteopenia with degenerative changes of the spine. No other destructive bone lesions are seen. There are degenerative changes of the thoracic spine and multilevel bridging enthesopathy. IMPRESSION: 1. No acute intracranial CT findings or interval changes. Old infarcts. Chronic change. 2. 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 3. Increased size of a right mid hilar lymph node. 4. Stable 1.5 cm pleural-based nodule in the posteromedial extreme right base. 5. Aortic and coronary artery atherosclerosis. 6. Emphysema. 7. Retained or aspirated secretions in the proximal right main bronchus. Aortic Atherosclerosis (  ICD10-I70.0) and Emphysema (ICD10-J43.9). Electronically Signed   By: Telford Nab M.D.   On: 07/19/2022 23:34        Scheduled Meds:  allopurinol  100 mg Oral Daily   calcitonin  4 Units/kg Subcutaneous Q12H   enoxaparin (LOVENOX) injection  40 mg Subcutaneous Q24H   latanoprost  1 drop Both Eyes QHS   senna  1 tablet Oral BID   simvastatin  20 mg Oral QHS   sodium chloride flush  3 mL Intravenous Q12H   Continuous Infusions:  dextrose 5 % and 0.45 % NaCl with KCl 20 mEq/L 100 mL/hr at 07/20/22 0828      LOS: 1 day   Time spent= 35 mins    Gaylin Bulthuis Arsenio Loader, MD [facility]  If 7PM-7AM, please contact night-coverage  07/20/2022, 8:48 AM

## 2022-07-20 NOTE — TOC Initial Note (Signed)
Transition of Care [facility]) - Initial/Assessment Note    Patient Details  Name: [name] MRN: [id number] Date of Birth: 1950-06-11  Transition of Care [facility]) CM/SW Contact:    Candie Chroman, LCSW Phone Number: 07/20/2022, 4:03 PM  Clinical Narrative:  Patient discharged to [facility] on 2/16 until admission yesterday. He has used about 17 SNF days. [facility] stated patient will have to pay 7 days of copays up front to return ($204 a day  7 = $1428). Patient thinks his significant other may be able to pay the amount. CSW left her a voicemail. If not will need to look into other SNF's that can work out a payment plan.                Expected Discharge Plan: Skilled Nursing Facility Barriers to Discharge: Continued Medical Work up   Patient Goals and CMS Choice            Expected Discharge Plan and Services     Post Acute Care Choice: [place] Living arrangements for the past 2 months: [place]                                      Prior Living Arrangements/Services Living arrangements for the past 2 months: Single Family Home Lives with:: Significant Other Patient language and need for interpreter reviewed:: Yes Do you feel safe going back to the place where you live?: Yes      Need for Family Participation in Patient Care: Yes (Comment) Care giver support system in place?: Yes (comment)   Criminal Activity/Legal Involvement Pertinent to Current Situation/Hospitalization: No - Comment as needed  Activities of Daily Living Home Assistive Devices/Equipment: Eyeglasses, Shower chair with back, Cane (specify quad or straight) ADL Screening (condition at time of admission) Patient's cognitive ability adequate to safely complete daily activities?: Yes Is the patient deaf or have difficulty hearing?: No Does the patient have difficulty seeing, even when wearing glasses/contacts?: Yes Does the patient have difficulty  concentrating, remembering, or making decisions?: Yes Patient able to express need for assistance with ADLs?: Yes Does the patient have difficulty dressing or bathing?: Yes Independently performs ADLs?: No Communication: Independent Dressing (OT): Needs assistance Is this a change from baseline?: Pre-admission baseline Grooming: Needs assistance Is this a change from baseline?: Pre-admission baseline Feeding: Independent Bathing: Needs assistance Is this a change from baseline?: Pre-admission baseline Toileting: Needs assistance Is this a change from baseline?: Pre-admission baseline In/Out Bed: Needs assistance Is this a change from baseline?: Pre-admission baseline Walks in Home: Needs assistance Is this a change from baseline?: Pre-admission baseline Does the patient have difficulty walking or climbing stairs?: Yes Weakness of Legs: Right Weakness of Arms/Hands: Right  Permission Sought/Granted Permission sought to share information with : Facility Sport and exercise psychologist, Family Supports Permission granted to share information with : Yes, Verbal Permission Granted  Share Information with NAME: Farrel Gobble  Permission granted to share info w AGENCY: [facility]  Permission granted to share info w Relationship: Significant other  Permission granted to share info w Contact Information: [phone]  Emotional Assessment Appearance:: Appears stated age Attitude/Demeanor/Rapport: Engaged, Gracious Affect (typically observed): Accepting, Appropriate, Calm, Pleasant Orientation: : Oriented to Self, Oriented to Place, Oriented to  Time, Oriented to Situation Alcohol / Substance Use: Not Applicable Psych Involvement: No (comment)  Admission diagnosis:  Hypercalcemia [E83.52] Weakness [R53.1] Patient Active Problem List  Diagnosis Date Noted   AMS (altered mental status) 07/19/2022   Hypotension 07/19/2022   Intractable pain 06/29/2022   Palliative care encounter  06/28/2022   Weakness 06/26/2022   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[place]) 06/11/2022   Hypercalcemia 06/11/2022   Hyponatremia 06/11/2022   Dyslipidemia 06/11/2022   History of pulmonary embolism 06/11/2022   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 ([place]) 05/11/2022   Right shoulder pain 05/11/2022   Biceps tendon tear 05/11/2022   History of DVT (deep vein thrombosis) 05/11/2022   History of pulmonary embolus (PE) 05/11/2022   Chronic anticoagulation 05/11/2022   Heart failure with reduced ejection fraction ([place]) 05/11/2022   History of CVA with residual deficit 05/11/2022   Iron deficiency 05/11/2022   Chronic venous insufficiency 03/03/2022   Hyperlipidemia 03/03/2022   SOB (shortness of breath) 09/15/2021   Hypertensive urgency 09/15/2021   Pneumonia of right lower lobe due to infectious organism 08/09/2021   Elevated troponin 01/28/2020   UTI (urinary tract infection) 01/28/2020   Hypokalemia 01/28/2020   Diarrhea 01/28/2020   Chest pain 01/28/2020   Chronic combined systolic and diastolic CHF (congestive heart failure) ([place]) [id number]   Acute metabolic encephalopathy [id number]   Coagulation disorder ([place]) 11/29/2019   AKI (acute kidney injury) ([place])    History of cocaine abuse ([place])    Acute ischemic right PCA stroke ([place]) 09/13/2019   Vision loss, bilateral    Tobacco abuse    Bradycardia    Non-sustained ventricular tachycardia (HCC)    Orthostatic dizziness 09/12/2019   Nonintractable headache    Slurred speech    Mixed diabetic hyperlipidemia associated with type 2 diabetes mellitus ([place])    Finger joint swelling, right 06/27/2019   Bilateral hand pain 06/14/2019   PAD (peripheral artery disease) ([place]) 05/20/2019   Encounter for long-term (current) use of high-risk medication 04/30/2019   Atrial fibrillation, chronic ([place]) 04/08/2019   Fever 04/08/2019   Severe sepsis ([place]) 04/08/2019   Abdominal pain 04/07/2019   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/2019   Polyarthralgia 03/05/2019   Thrombocytopenia ([place]) 03/05/2019   Pain due to onychomycosis of toenails of both feet 11/20/2018   Diabetic neuropathy ([place]) 11/20/2018   Influenza A 06/22/2018   Community acquired pneumonia 06/22/2018   Arthritis 05/12/2017   Varicose veins of leg with swelling, bilateral 01/18/2017   Facial twitching    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 ([place]) 02/24/2015   Pneumonia 10/12/2014   Osteoarthritis of right knee 02/11/2014   Pain and swelling of knee 01/08/2014   Pain in extremity 01/08/2014   Weakness due to cerebrovascular accident 01/08/2014   Pulmonary embolism ([place]) 06/19/2012   Gout 09/22/2011   CVA, old, speech/language deficit 09/01/2011   Cerebral infarction ([place]) 04/26/2011   Type 2 diabetes mellitus with diabetic polyneuropathy, without long-term current use of insulin ([place]) 04/26/2011   Accelerated hypertension 04/26/2011   Anemia 04/26/2011   DVT (deep venous thrombosis) ([place]) 04/26/2011   Essential hypertension 04/26/2011   PCP:  [place]:   [facility] [address] ([address]), [place] [address] [place] ([address]) [place] [address] Phone: [phone] Fax: [phone]     Social Determinants of Health (SDOH) Social History: SDOH Screenings   Food Insecurity: No Food Insecurity (07/20/2022)  Housing: Low Risk  (07/20/2022)  Transportation Needs: No Transportation Needs (07/20/2022)  Utilities: Not At Risk (07/20/2022)  Alcohol Screen: Low Risk  (06/21/2022)  Depression (  PHQ2-9): Medium Risk (06/18/2022)  Financial Resource Strain: Low Risk  (06/21/2022)  Physical Activity: Inactive (06/21/2022)  Social Connections: Socially Isolated (06/21/2022)  Stress: Stress Concern Present (06/21/2022)  Tobacco Use: High Risk (07/19/2022)   SDOH Interventions:     Readmission Risk Interventions    01/29/2020   12:41 PM  Readmission Risk  Prevention Plan  Transportation Screening Complete  Palliative Care Screening Not Applicable  Medication Review (RN Care Manager) Complete

## 2022-07-20 NOTE — ED Notes (Signed)
Pt noted to be up sitting on side of bed with legs over rails. Pt educated on using call bell and not attempting to get up on own due to his weakness. Pt placed back in bed. Bed alarm activated and audible. Bed locked and in lowest position with call bell in reach .

## 2022-07-20 NOTE — ED Notes (Signed)
Pt placed on hospital bed and repositioned to position of comfort. Male purewik in placed and connected to suction. No additional needs voiced at this time.

## 2022-07-20 NOTE — Progress Notes (Incomplete)
Tele reported 7.36 sec. sinus arrest. Otilio Miu, NP informed

## 2022-07-20 NOTE — Progress Notes (Signed)
I paged K. Foust, NP, "squamous cell carcinoma of the right upper lobe lung with direct invasion into his ribs, possible T1 involvement for increasing generalized, PE/DVT On Xarelto, CHF, A fib comes with weakness and pain. Patient was found to have hypercalcemia complicated by acute kidney injury, hyponatremia and pain and discomfort. Tele called. Mr. Devich had SVR with 3.95 sec pause and unsustained hr of 28. I was with the pt when it happened. He had no symptoms. Also the tele order needs to be renewed. Thanks "  No new orders.

## 2022-07-20 NOTE — Evaluation (Signed)
Physical Therapy Evaluation Patient Details Name: [name] MRN: [id number] DOB: 05-30-1950 Today's Date: 07/20/2022  History of Present Illness  [name] is a 76yoM who [facility] after concerns of hypercalcemia on recent labs. 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  Clinical Impression  Pt in bed on entry meeting with TOC, recently finished with OT assessment. Pt is calm, participatory, has HOH and chronic expressive aphasia that precludes detailed cognitive assessment, however seems that pt is at least somewhat confused to global situation regarding his admission and current medical situation. Pt is able to follow simple cues for participation in session with good accuracy. Bed mobility appears strong and independent but met with dizziness at EOB, PMH of orthostatis. Pt able to manage STS transfers from elevated surface and <63f stepping at bedside, quite slow, but steady. His entire session is dominated by persistent pain his RUE, per chart likely related to Rt Pancoast tumor. Pain detracts from pt being able to tolerate much more, session ended at this point. Pt assisted back to bed, HOB at 30 degrees, pt agreeable to 2 pillow elevation of RUE for pain assist. Will continue to follow. Pt's severe intolerance to mobility, difficulty using assistive device for ADL performance, and limited tolerance to using dominant hand for self care make return to home difficult to aPryor Curiafeels continuing along this trajectory offers best chance at preservation of or rehabilitation of mobility/independence.      Recommendations for follow up therapy are one component of a multi-disciplinary discharge planning process, led by the attending physician.  Recommendations may be updated based on patient status, additional functional criteria and insurance authorization.  Follow Up  Recommendations Skilled nursing-short term rehab (<3 hours/day) Can patient physically be transported by private vehicle: No    Assistance Recommended at Discharge Intermittent Supervision/Assistance  Patient can return home with the following  Assistance with cooking/housework;Direct supervision/assist for medications management;Assist for transportation;Help with stairs or ramp for entrance;A lot of help with walking and/or transfers;A lot of help with bathing/dressing/bathroom;Assistance with feeding    Equipment Recommendations None recommended by PT  Recommendations for Other Services       Functional Status Assessment Patient has had a recent decline in their functional status and demonstrates the ability to make significant improvements in function in a reasonable and predictable amount of time.     Precautions / Restrictions Precautions Precautions: Fall Restrictions Weight Bearing Restrictions: No      Mobility  Bed Mobility Overal bed mobility: Needs Assistance Bed Mobility: Supine to Sit, Sit to Supine     Supine to sit: Supervision Sit to supine: Supervision   General bed mobility comments: dizzy sitting EOB    Transfers Overall transfer level: Needs assistance Equipment used: Rolling walker (2 wheels) Transfers: Sit to/from Stand Sit to Stand: From elevated surface, Min guard           General transfer comment: still dizzy while standing    Ambulation/Gait Ambulation/Gait assistance: Min guard Gait Distance (Feet): 16 Feet Assistive device: Rolling walker (2 wheels) Gait Pattern/deviations: Step-to pattern       General Gait Details: alternate FWD, backward at bedside with RW, follows simple cues well, does his best to manage RW with RUE despite lack of effective grip on handle.  Stairs            Wheelchair Mobility    Modified Rankin (Stroke Patients  Only)       Balance                                              Pertinent Vitals/Pain Pain Assessment Pain Assessment: 0-10 Pain Score: 10-Worst pain ever Pain Location: RUE Pain Intervention(s): Limited activity within patient's tolerance, Monitored during session    Home Living Family/patient expects to be discharged to:: Skilled nursing facility (Simultaneous filing. User may not have seen previous data.) Living Arrangements: Alone Available Help at Discharge: Available PRN/intermittently (girlfriend is primary support person per pt) Type of Home: Apartment Home Access: Level entry       Home Layout: One level Home Equipment: Cane - single point;BSC/3in1      Prior Function Prior Level of Function : Independent/Modified Independent (Simultaneous filing. User may not have seen previous data.)             Mobility Comments: 1 month prior, AMB c [facility] household distances, driving; has been at [facility] since previous admission, most recent AMB using RW, however RUE pain has been very limiting regardless. (Simultaneous filing. User may not have seen previous data.) ADLs Comments: Pt has had increasing assistance needs over past several weeks, requires assist for his ADL performance. (Simultaneous filing. User may not have seen previous data.)     Hand Dominance   Dominant Hand: Right (Simultaneous filing. User may not have seen previous data.)    Extremity/Trunk Assessment   Upper Extremity Assessment Upper Extremity Assessment: RUE deficits/detail;Generalized weakness RUE Deficits / Details: Increased R shoulder pain with mobility, seems to be baseline deficit. Requires assist to grasp RW/support arm with functional mobility attempts. RUE: Unable to fully assess due to pain;Shoulder pain with ROM RUE Coordination: decreased fine motor;decreased gross motor    Lower Extremity Assessment Lower Extremity Assessment: Defer to PT evaluation;Generalized weakness       Communication   Communication: HOH;Expressive difficulties  (Simultaneous filing. User may not have seen previous data.)  Cognition Arousal/Alertness:  (pt seems confused at times, he endorses feelign confused; his baseline aphasia makes it difficult to determine how confused he is  Simultaneous filing. User may not have seen previous data.)                                              General Comments      Exercises     Assessment/Plan    PT Assessment Patient needs continued PT services  PT Problem List Decreased strength;Decreased range of motion;Decreased activity tolerance;Decreased balance;Decreased mobility;Decreased cognition;Decreased safety awareness;Decreased knowledge of precautions;Cardiopulmonary status limiting activity;Pain       PT Treatment Interventions Gait training;Functional mobility training;Therapeutic activities;Therapeutic exercise;Balance training;Neuromuscular re-education;Cognitive remediation;Patient/family education    PT Goals (Current goals can be found in the Care Plan section)  Acute Rehab PT Goals PT Goal Formulation: Patient unable to participate in goal setting    Frequency Min 2X/week     Co-evaluation               AM-PAC PT "6 Clicks" Mobility  Outcome Measure Help needed turning from your back to your side while in a flat bed without using bedrails?: None Help needed moving from lying on your back to sitting on the side of a flat bed  without using bedrails?: None Help needed moving to and from a bed to a chair (including a wheelchair)?: A Little Help needed standing up from a chair using your arms (e.g., wheelchair or bedside chair)?: A Little Help needed to walk in hospital room?: A Lot Help needed climbing 3-5 steps with a railing? : A Lot 6 Click Score: 18    End of Session   Activity Tolerance: Patient limited by pain Patient left: in bed;with call bell/phone within reach;with bed alarm set;with SCD's reapplied Nurse Communication: Mobility status (pt has water,  but no cup, no straw) PT Visit Diagnosis: Unsteadiness on feet (R26.81);Other abnormalities of gait and mobility (R26.89);Difficulty in walking, not elsewhere classified (R26.2);Dizziness and giddiness (R42);Pain Pain - Right/Left: Right Pain - part of body: Arm    Time: 1445-1505 PT Time Calculation (min) (ACUTE ONLY): 20 min   Charges:   PT Evaluation $PT Eval High Complexity: 1 High        3:32 PM, 07/20/22 Etta Grandchild, PT, DPT Physical Therapist - [facility]  [phone] ([place])      Refujio Haymer C 07/20/2022, 3:24 PM

## 2022-07-20 NOTE — Evaluation (Signed)
Occupational Therapy Evaluation Patient Details Name: [name] MRN: [id number] DOB: 21-Feb-1951 Today's Date: 07/20/2022   History of Present Illness [name] is a 65yoM who comes to [facility] on 07/19/22 after concerns of hypercalcemia on recent labs. 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   Clinical Impression   [name] was seen for OT evaluation this date. Prior to hospital admission, pt was receiving care in a STR facility. Per chart, pt living alone and was generally MOD I for ADL management in February 2024 prior to his initial hospital stay but was requiring increased assistance 2/2 his cancer diagnosis and related needs. Pt presents to acute OT demonstrating impaired ADL performance and functional mobility 2/2 increased RUE pain, decreased activity tolerance, and generalized weakness (See OT problem list for additional concerns). Pt currently requires MIN-MOD A for STS/functional mobility, MOD A for LB ADL management, and SET UP assist for seated UB ADL management.  Pt would benefit from skilled OT services to address noted impairments and functional limitations (see below for any additional details) in order to maximize safety and independence while minimizing falls risk and caregiver burden. Upon hospital discharge, recommend STR to maximize pt safety and return to PLOF.        Recommendations for follow up therapy are one component of a multi-disciplinary discharge planning process, led by the attending physician.  Recommendations may be updated based on patient status, additional functional criteria and insurance authorization.   Follow Up Recommendations  Skilled nursing-short term rehab (<3 hours/day)     Assistance Recommended at Discharge Frequent or constant Supervision/Assistance  Patient can return home with the following A lot of help with  bathing/dressing/bathroom;Assistance with cooking/housework;Direct supervision/assist for medications management;Help with stairs or ramp for entrance;A little help with walking and/or transfers    Functional Status Assessment  Patient has had a recent decline in their functional status and demonstrates the ability to make significant improvements in function in a reasonable and predictable amount of time.  Equipment Recommendations  Other (comment) (defer)    Recommendations for Other Services       Precautions / Restrictions Precautions Precautions: Fall Restrictions Weight Bearing Restrictions: No      Mobility Bed Mobility Overal bed mobility: Needs Assistance Bed Mobility: Supine to Sit, Sit to Supine     Supine to sit: Supervision Sit to supine: Supervision   General bed mobility comments: dizzy sitting EOB    Transfers Overall transfer level: Needs assistance Equipment used: Rolling walker (2 wheels) Transfers: Sit to/from Stand Sit to Stand: From elevated surface, Mod assist           General transfer comment: MIN A to side step toward HOB.      Balance Overall balance assessment: Needs assistance Sitting-balance support: Feet supported Sitting balance-Leahy Scale: Good Sitting balance - Comments: steady static sitting, reaching within BOS.   Standing balance support: Bilateral upper extremity supported Standing balance-Leahy Scale: Fair Standing balance comment: needs assitance 2/2 to RUE pain and unsteadiness                           ADL either performed or assessed with clinical judgement   ADL Overall ADL's : Needs assistance/impaired  General ADL Comments: Pt functionally limited by increased pain with mobility, generalized weakness, and decreased activity tolerance. He performs bed mobility with SUPERVISION, MOD A to STS from EOB with assist to position hands on RW and cueing for  hand/foot placement. MIN A to side step at EOB with assist to advance RW. Anticipate MOD A for LB ADL management from STS and LB bathing. MIN A for toileting, toilet transfer, and standing grooming at sink.     Vision Patient Visual Report: No change from baseline       Perception     Praxis      Pertinent Vitals/Pain Pain Assessment Pain Assessment: Faces Faces Pain Scale: Hurts little more Pain Location: R UE Pain Descriptors / Indicators: Sore, Guarding, Grimacing Pain Intervention(s): Limited activity within patient's tolerance, Monitored during session, Repositioned     Hand Dominance Right (Simultaneous filing. User may not have seen previous data.)   Extremity/Trunk Assessment Upper Extremity Assessment Upper Extremity Assessment: RUE deficits/detail;Generalized weakness RUE Deficits / Details: Increased R shoulder pain with mobility, seems to be baseline deficit. Requires assist to grasp RW/support arm with functional mobility attempts. RUE: Unable to fully assess due to pain;Shoulder pain with ROM RUE Coordination: decreased [name] motor;decreased gross motor   Lower Extremity Assessment Lower Extremity Assessment: Defer to PT evaluation;Generalized weakness       Communication Communication Communication: HOH;Expressive difficulties (Simultaneous filing. User may not have seen previous data.)   Cognition   Behavior During Therapy: [facility] for tasks assessed/performed Overall Cognitive Status: No family/caregiver present to determine baseline cognitive functioning                         Following Commands: Follows one step commands with increased time Safety/Judgement: Decreased awareness of safety, Decreased awareness of deficits   Problem Solving: Slow processing, Decreased initiation General Comments: Pt with difficulty communicating due to affects of past CVA, oriented to self, place, and limited situation.     General Comments       Exercises  Other Exercises Other Exercises: Pt educated on role of OT in acute setting, safe use of AE/DME for ADL management, and DC recs.   Shoulder Instructions      Home Living Family/patient expects to be discharged to:: Skilled nursing facility (Simultaneous filing. User may not have seen previous data.) Living Arrangements: Alone Available Help at Discharge: Available PRN/intermittently (girlfriend is primary support person per pt) Type of Home: Apartment Home Access: Level entry     Home Layout: One level     Bathroom Shower/Tub: Teacher, early years/pre: Standard Bathroom Accessibility: Yes   Home Equipment: Cane - single point;BSC/3in1          Prior Functioning/Environment Prior Level of Function : Independent/Modified Independent (Simultaneous filing. User may not have seen previous data.)             Mobility Comments: 1 month prior, AMB c [facility] household distances, driving; has been at [facility] since previous admission, most recent AMB using RW, however RUE pain has been very limiting regardless. (Simultaneous filing. User may not have seen previous data.) ADLs Comments: Pt has had increasing assistance needs over past several weeks, requires assist for his ADL performance. (Simultaneous filing. User may not have seen previous data.)        OT Problem List: Decreased strength;Decreased activity tolerance;Impaired balance (sitting and/or standing);Decreased cognition;Decreased knowledge of use of DME or AE;Decreased knowledge of precautions;Pain;Decreased range of motion;Decreased safety awareness  OT Treatment/Interventions: Self-care/ADL training;Therapeutic exercise;Patient/family education;Energy conservation;Therapeutic activities;DME and/or AE instruction;Balance training    OT Goals(Current goals can be found in the care plan section) Acute Rehab OT Goals Patient Stated Goal: to feel better OT Goal Formulation: With patient Time For Goal Achievement:  08/03/22 Potential to Achieve Goals: Good ADL Goals Pt Will Perform Grooming: sitting;with set-up;with supervision Pt Will Perform Lower Body Dressing: sit to/from stand;with set-up;with supervision;with adaptive equipment Pt Will Transfer to Toilet: with modified independence;bedside commode;ambulating Pt Will Perform Toileting - Clothing Manipulation and hygiene: sit to/from stand;with modified independence;with adaptive equipment  OT Frequency: Min 2X/week    Co-evaluation              AM-PAC OT "6 Clicks" Daily Activity     Outcome Measure Help from another person eating meals?: None Help from another person taking care of personal grooming?: A Little Help from another person toileting, which includes using toliet, bedpan, or urinal?: A Little Help from another person bathing (including washing, rinsing, drying)?: A Lot Help from another person to put on and taking off regular upper body clothing?: A Little Help from another person to put on and taking off regular lower body clothing?: A Lot 6 Click Score: 17   End of Session Equipment Utilized During Treatment: Gait belt;Rolling walker (2 wheels)  Activity Tolerance: Patient tolerated treatment well Patient left: in bed  OT Visit Diagnosis: Other abnormalities of gait and mobility (R26.89);Muscle weakness (generalized) (M62.81);Pain Pain - Right/Left: Right Pain - part of body: Shoulder;Arm;Hand                Time: [id number] OT Time Calculation (min): 17 min Charges:  OT General Charges $OT Visit: 1 Visit OT Evaluation $OT Eval Moderate Complexity: 1 Mod OT Treatments $Self Care/Home Management : 8-22 mins  Shara Blazing, M.S., OTR/L 07/20/22, 3:27 PM

## 2022-07-20 NOTE — Progress Notes (Signed)
       CROSS COVER NOTE  NAME: [name] MRN: [id number] DOB : 01-Apr-1951 ATTENDING PHYSICIAN: Damita Lack, MD    Date of Service   07/20/2022   HPI/Events of Note   Report ***Bradycardia with pauses On Review of chart *** Bedside eval*** HPI***  Interventions   Assessment/Plan:  Bradycardia with Pause and Sinus Arrest 1755-2.15 pause, 2112 - 3.95/ hr 28, 2257 3.84/26 hr, 2312 hr 28, 2327 7.36 sinus arrest  Hold PRN Metoprolol Labs X    *** professional thanks      To reach the provider On-Call:   7AM- 7PM see care teams to locate the attending and reach out to them via [email or web address] Password: [email or web address] 7PM-7AM contact night-coverage If you still have difficulty reaching the appropriate provider, please page the [facility] (Director on Call) for [facility] on amion for assistance  This document was prepared using Systems analyst and may include unintentional dictation errors.  Neomia Glass DNP, MBA, FNP-BC, PMHNP-BC Nurse Practitioner [facility] Pager [phone]

## 2022-07-20 NOTE — Consult Note (Incomplete)
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 severe right neural foraminal stenosis at T2-3 due to severe right facet arthrosis and right-sided ligamentum flavum calcification. IMPRESSION: Known large right apical lung malignancy with extensive involvement of the T1 vertebral body and posterior elements and milder involvement of C7 and T2. Extension into the right neural foramina at C7-T1 and T1-2. No significant epidural tumor in the spinal canal itself. Electronically Signed   By: ALogan BoresM.D.   On: 06/28/2022 14:52   MR BRAIN W WO CONTRAST  Result Date: 06/28/2022 CLINICAL DATA:  Metastatic disease evaluation. Recently diagnosed lung cancer. Dizziness. EXAM: MRI HEAD WITHOUT AND WITH CONTRAST TECHNIQUE: Multiplanar, multiecho pulse sequences of the brain and surrounding structures were obtained without and with intravenous contrast. CONTRAST:  861mGADAVIST GADOBUTROL 1 MMOL/ML IV SOLN COMPARISON:  Head CT 06/25/2022 and MRI 09/12/2019. Head and neck CTA 09/12/2019. FINDINGS: Brain: There is no evidence of an acute infarct, mass, midline shift, or extra-axial fluid collection. Chronic left MCA  and bilateral PCA infarcts are again noted with associated hemosiderin deposition which is greatest in the right MCA infarct. Elsewhere, small T2 hyperintensities elsewhere in the cerebral white matter and patchy T2 hyperintensity in the pons are nonspecific but compatible with mild chronic small vessel ischemic disease, similar  to the prior MRI. A small chronic right cerebellar infarct is unchanged from the prior MRI. There is mild cerebral atrophy. A cavum septum pellucidum et vergae is noted. No abnormal enhancement is identified, although postcontrast imaging is moderately motion degraded which reduces the sensitivity for detection of very small lesions. Vascular: Small distal right vertebral artery, likely with chronically diminished flow or possibly chronic occlusion. Skull and upper cervical spine: No suspicious marrow lesion. Sinuses/Orbits: Bilateral cataract extraction. Minimal mucosal thickening in the ethmoid sinuses. Clear mastoid air cells. Other: None. IMPRESSION: 1. No evidence of intracranial metastases. 2. Multiple chronic infarcts as above. Electronically Signed   By: Logan Bores M.D.   On: 06/28/2022 14:00   NM PET Image Initial (PI) Skull Base To Thigh  Result Date: 06/26/2022 CLINICAL DATA:  Initial treatment strategy for lung mass. EXAM: NUCLEAR MEDICINE PET SKULL BASE TO THIGH TECHNIQUE: 9.55 mCi F-18 FDG was injected intravenously. Full-ring PET imaging was performed from the skull base to thigh after the radiotracer. CT data was obtained and used for attenuation correction and anatomic localization. Fasting blood glucose: 107 mg/dl COMPARISON:  CT chest 06/11/2022 FINDINGS: Mediastinal blood pool activity: SUV max 2.11 Liver activity: SUV max NA NECK: No tracer avid lymph nodes within the neck. Incidental CT findings: None. CHEST: Right apical lung mass is again noted. This measures 8.7 x 6.0 by 6.45 and has an SUV max of 23.36, image 66/2. As mentioned previously chest wall invasion  is identified with a roast of changes involving the posterior aspect of the right first and second ribs as well as the T1 transverse process and right-side of the T1 vertebra. Tracer avid right hilar lymph node measures 2.1 cm within SUV max of 13.24. Incidental CT findings: Aortic atherosclerosis. Coronary artery calcifications. Mild cardiac enlargement. ABDOMEN/PELVIS: No abnormal hypermetabolic activity within the liver, pancreas, adrenal glands, or spleen. No hypermetabolic lymph nodes in the abdomen or pelvis. Incidental CT findings: Aortic atherosclerosis. Filter identified within the IVC. Scarring, calcification in volume loss noted involving the upper pole of the left kidney. SKELETON: No focal hypermetabolic activity to suggest skeletal metastasis. Incidental CT findings: There is no significant tracer uptake associated with the previously characterized lucent bone lesion involving T11 vertebral body which has been present since 04/07/2019 and may represent a benign hemangioma. IMPRESSION: 1. Right apical lung mass is intensely FDG avid compatible with primary bronchogenic carcinoma. There is evidence for chest wall invasion with erosive changes involving posterior aspect right first and second ribs and right-side of the T1 vertebra and transverse process. The right first and second ribs as well as the T1 transverse process and right-side of the T1 vertebra. MRI of the thoracic spine may be helpful to assess for neural foraminal involvement and spinal canal involvement at this level. 2. FDG avid right hilar lymph node compatible with nodal metastasis. 3. No signs of FDG avid distant metastatic disease. 4. Aortic Atherosclerosis (ICD10-I70.0). Coronary artery calcifications. Electronically Signed   By: Kerby Moors M.D.   On: 06/26/2022 18:15   CT Head Wo Contrast  Result Date: 06/25/2022 CLINICAL DATA:  Generalized weakness.  History of bilateral CVAs. EXAM: CT HEAD WITHOUT CONTRAST TECHNIQUE:  Contiguous axial images were obtained from the base of the skull through the vertex without intravenous contrast. RADIATION DOSE REDUCTION: This exam was performed according to the departmental dose-optimization program which includes automated exposure control, adjustment of the mA and/or kV according to patient size and/or use of iterative reconstruction technique. COMPARISON:  Head  CT 06/11/2022. FINDINGS: Brain: Again noted are chronic infarcts in the left frontoparietal area and in the right-greater-than-left occipital lobes, linear lacunar infarct superior right cerebellar hemisphere, and mild atrophy, small-vessel disease and atrophic ventriculomegaly with persistent cavum septi pellucidi and cavum vergae. No midline shift is seen. Benign dural calcifications are scattered along the falx. No new asymmetry is seen concerning for an acute cortical based infarct, hemorrhage, mass or mass effect. Basal cisterns are clear. There is stable mild ex vacuo prominence of the body of the left lateral ventricle and occipital horn of right lateral ventricle. Vascular: Patchy calcification noted both siphons, left distal vertebral artery. No hyperdense central vessels. Skull: Negative for fractures or focal lesions. Sinuses/Orbits: There are old lens replacements. No acute orbital findings. Mild chronic disease in the ethmoids, both maxillary sinuses. The frontal and sphenoid sinus, bilateral mastoid air cells, and middle ears are clear. Other: None. IMPRESSION: 1. No acute intracranial CT findings or interval changes. 2. 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 3. Sinus disease. Electronically Signed   By: Telford Nab M.D.   On: 06/25/2022 21:40   DG Chest Portable 1 View  Result Date: 06/25/2022 CLINICAL DATA:  Weakness EXAM: PORTABLE CHEST 1 VIEW COMPARISON:  06/11/2022 FINDINGS: Stable cardiomegaly. Right apical mass with destructive changes of the adjacent right first and second ribs, not appreciably changed  from the previous exam. Elsewhere, lungs are clear. No pleural effusion or pneumothorax. IMPRESSION: Right apical mass with destructive changes of the adjacent right first and second ribs, not appreciably changed from the previous exam. Electronically Signed   By: Davina Poke D.O.   On: 06/25/2022 21:31    Assessment and plan- Patient is a 72 y.o. male ***   Thank you for this kind referral and the opportunity to participate in the care of this  Patient   Visit Diagnosis 1. Hypercalcemia   2. Weakness     Dr. Randa Evens, MD, MPH [facility] at [facility] [id number] 07/20/2022

## 2022-07-20 NOTE — Plan of Care (Signed)
  Problem: Health Behavior/Discharge Planning: Goal: Ability to manage health-related needs will improve Outcome: Progressing   

## 2022-07-20 NOTE — Consult Note (Signed)
ANTICOAGULATION CONSULT NOTE - Initial Consult  Pharmacy Consult for enoxaparin Indication: DVT  No Known Allergies  Patient Measurements: Height: '6\' 2"'$  (188 cm) Weight: 82.5 kg (181 lb 14.1 oz) IBW/kg (Calculated) : 82.2   Vital Signs: Temp: 98 F (36.7 C) (03/05 0800) Temp Source: Oral (03/05 0800) BP: 115/76 (03/05 0800) Pulse Rate: 69 (03/05 0815)  Labs: Recent Labs    07/19/22 1946 07/19/22 2319 07/20/22 0541  HGB 10.2* 9.6* 9.8*  HCT 34.0* 31.8* 31.9*  PLT 219 221 210  APTT  --  29  --   LABPROT  --  14.8  --   INR  --  1.2  --   CREATININE 1.50* 1.37* 1.08  TROPONINIHS 23*  --   --     Estimated Creatinine Clearance: 70.8 mL/min (by C-G formula based on SCr of 1.08 mg/dL).   Medical History: Past Medical History:  Diagnosis Date   Arthritis    Atrial fibrillation ([place])    Cancer ([place])    Prostate   CHF (congestive heart failure) (HCC)    Diabetes mellitus without complication ([place])    Gout current and history of   History of cocaine abuse ([place])    + UDS on admission   History of deep vein thrombosis 2006   History of tobacco abuse    has smoked for 50 years   Hypertension    Pulmonary embolism ([place]) 2010   Renal infarct ([place]) 03/2019   left renal infarct   Stroke ([place]) 2017   affected speech and right side-uses cane   Varicose veins of both lower extremities    Wears dentures    full upper, partial lower    Medications:  PTA rivaroxaban- last reported dose 3/3 Enoxaparin 40 mg SQ x 1 in ED 3/5 @ 0822  Assessment: Pharmacy consulted to dose enoxaparin in this 72 y.o. M who presented to the ED with Afib and Hx of DVT treated with rivaroxaban PTA.  Plan is to switch back to rivaroxaban at discharge.  CrCl 70.8, Wt 82.5 kg (BMI 23.35), INR 1.2, Hgb 9.8, Plt 210  Goal of Therapy:  Anti-Xa level: 0.6-1 units/mL, to be checked if clinically indicated  Monitor platelets by anticoagulation protocol: Yes   Plan:  Enoxaparin 82.5 mg SQ Q12H  (1 mg/kg Q12H) Pt received 40 mg enoxaparin this morning, will order another 42.5 mg now and begin 82.5 mg dosing tonight.  Alison Murray 07/20/2022,9:54 AM

## 2022-07-21 ENCOUNTER — Encounter: Payer: Self-pay | Admitting: Oncology

## 2022-07-21 DIAGNOSIS — Z515 Encounter for palliative care: Secondary | ICD-10-CM

## 2022-07-21 LAB — BASIC METABOLIC PANEL
Anion gap: 9 (ref 5–15)
Anion gap: 9 (ref 5–15)
BUN: 29 mg/dL — ABNORMAL HIGH (ref 8–23)
BUN: 26 mg/dL — ABNORMAL HIGH (ref 8–23)
CO2: 24 mmol/L (ref 22–32)
CO2: 23 mmol/L (ref 22–32)
Calcium: 11.6 mg/dL — ABNORMAL HIGH (ref 8.9–10.3)
Calcium: 11.3 mg/dL — ABNORMAL HIGH (ref 8.9–10.3)
Chloride: 101 mmol/L (ref 98–111)
Chloride: 102 mmol/L (ref 98–111)
Creatinine, Ser: 0.86 mg/dL (ref 0.61–1.24)
Creatinine, Ser: 0.82 mg/dL (ref 0.61–1.24)
GFR, Estimated: >60 mL/min (ref >60)
GFR, Estimated: >60 mL/min (ref >60)
Glucose, Bld: 120 mg/dL — ABNORMAL HIGH (ref 70–99)
Glucose, Bld: 122 mg/dL — ABNORMAL HIGH (ref 70–99)
Potassium: 4.3 mmol/L (ref 3.5–5.1)
Potassium: 4.3 mmol/L (ref 3.5–5.1)
Sodium: 134 mmol/L — ABNORMAL LOW (ref 135–145)
Sodium: 134 mmol/L — ABNORMAL LOW (ref 135–145)

## 2022-07-21 LAB — CBC
HCT: 28.5 % — ABNORMAL LOW (ref 39.0–52.0)
Hemoglobin: 8.9 g/dL — ABNORMAL LOW (ref 13.0–17.0)
MCH: 24.3 pg — ABNORMAL LOW (ref 26.0–34.0)
MCHC: 31.2 g/dL (ref 30.0–36.0)
MCV: 77.7 fL — ABNORMAL LOW (ref 80.0–100.0)
Platelets: 186 10*3/uL (ref 150–400)
RBC: 3.67 MIL/uL — ABNORMAL LOW (ref 4.22–5.81)
RDW: 15.9 % — ABNORMAL HIGH (ref 11.5–15.5)
WBC: 8.2 10*3/uL (ref 4.0–10.5)
nRBC: 0 % (ref 0.0–0.2)

## 2022-07-21 LAB — MAGNESIUM
Magnesium: 1.9 mg/dL (ref 1.7–2.4)
Magnesium: 1.8 mg/dL (ref 1.7–2.4)

## 2022-07-21 LAB — PARATHYROID HORMONE, INTACT (NO CA): PTH: 13 pg/mL — ABNORMAL LOW (ref 15–65)

## 2022-07-21 LAB — T4, FREE: Free T4: 1.35 ng/dL — ABNORMAL HIGH (ref 0.61–1.12)

## 2022-07-21 LAB — TSH: TSH: 0.219 u[IU]/mL — ABNORMAL LOW (ref 0.350–4.500)

## 2022-07-21 MED ORDER — SODIUM CHLORIDE 0.9 % IV SOLN
60.0000 mg | Freq: Once | INTRAVENOUS | Status: AC
  Administered 2022-07-21: 60 mg via INTRAVENOUS
  Filled 2022-07-21: qty 20

## 2022-07-21 MED ORDER — MAGNESIUM SULFATE 2 GM/50ML IV SOLN
2.0000 g | Freq: Once | INTRAVENOUS | Status: AC
  Administered 2022-07-21: 2 g via INTRAVENOUS
  Filled 2022-07-21: qty 50

## 2022-07-21 NOTE — Progress Notes (Signed)
Mobility Specialist - Progress Note   07/21/22 1549  Mobility  Activity Ambulated with assistance in room;Transferred from chair to bed  Level of Assistance Standby assist, set-up cues, supervision of patient - no hands on  Assistive Device Front wheel walker  Distance Ambulated (ft) 10 ft  Activity Response Tolerated well  $Mobility charge 1 Mobility     Mobility responded to chair alarm. Pt standing beside recliner without RW, blankets on floor, and tray in front of pt. Mildly unsteady but no LOB, pt provided with AD. Decreased safety awareness this afternoon. Easily redirected. Pt ambulated back to bed with alarm set, needs in reach. RN notified.   Kathee Delton Mobility Specialist 07/21/22, 3:59 PM

## 2022-07-21 NOTE — Progress Notes (Signed)
Mobility Specialist - Progress Note   07/21/22 1200  Mobility  Activity Ambulated with assistance in room;Transferred from bed to chair  Level of Assistance Standby assist, set-up cues, supervision of patient - no hands on  Assistive Device Front wheel walker  Distance Ambulated (ft) 15 ft  Activity Response Tolerated well  $Mobility charge 1 Mobility     Pre-mobility: 80 HR, 100% SpO2 Post-mobility: 89 HR, 100% SpO2   Pt lying in bed upon arrival, utilizing RA. Pt completed bed mobility independently. STS with minA and momentum before ambulating around bed to chair with supervision and RW. Pt voiced mild SOB with activity. No LOB. No dizziness. Pt left in chair with alarm set, needs in reach. RN notified.   Kathee Delton Mobility Specialist 07/21/22, 12:06 PM

## 2022-07-21 NOTE — NC FL2 (Signed)
[place] LEVEL OF CARE FORM     IDENTIFICATION  Patient Name: [name] Birthdate: Sep 30, 1950 Sex: male Admission Date (Current Location): 07/19/2022  [facility] and [place] Number:  Engineering geologist and Address:  [facility], [address] [place], [place] [address]      Provider Number: [id number]  Attending Physician Name and Address:  Damita Lack, MD  Relative Name and Phone Number:       Current Level of Care: Hospital Recommended Level of Care: [place] Prior Approval Number:    Date Approved/Denied:   PASRR Number: [id number]  Discharge Plan: SNF    Current Diagnoses: Patient Active Problem List   Diagnosis Date Noted   AMS (altered mental status) 07/19/2022   Hypotension 07/19/2022   Intractable pain 06/29/2022   Palliative care encounter 06/28/2022   Weakness 06/26/2022   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) 05/11/2022   Right shoulder pain 05/11/2022   Biceps tendon tear 05/11/2022   History of DVT (deep vein thrombosis) 05/11/2022   History of pulmonary embolus (PE) 05/11/2022   Chronic anticoagulation 05/11/2022   Heart failure with reduced ejection fraction ([place]) 05/11/2022   History of CVA with residual deficit 05/11/2022   Iron deficiency 05/11/2022   Chronic venous insufficiency 03/03/2022   Hyperlipidemia 03/03/2022   SOB (shortness of breath) 09/15/2021   Hypertensive urgency 09/15/2021   Pneumonia of right lower lobe due to infectious organism 08/09/2021   Elevated troponin 01/28/2020   UTI (urinary tract infection) 01/28/2020   Hypokalemia 01/28/2020   Diarrhea 01/28/2020   Chest pain 01/28/2020   Chronic combined systolic and diastolic CHF (congestive heart failure) ([place])  [id number]   Acute metabolic encephalopathy [id number]   Coagulation disorder ([place]) 11/29/2019   AKI (acute kidney injury) ([place])    History of cocaine abuse ([place])    Acute ischemic right PCA stroke ([place]) 09/13/2019   Vision loss, bilateral    Tobacco abuse    Bradycardia    Non-sustained ventricular tachycardia (HCC)    Orthostatic dizziness 09/12/2019   Nonintractable headache    Slurred speech    Mixed diabetic hyperlipidemia associated with type 2 diabetes mellitus ([place])    Finger joint swelling, right 06/27/2019   Bilateral hand pain 06/14/2019   PAD (peripheral artery disease) ([place]) 05/20/2019   Encounter for long-term (current) use of high-risk medication 04/30/2019   Atrial fibrillation, chronic ([place]) 04/08/2019   Fever 04/08/2019   Severe sepsis ([place]) 04/08/2019   Abdominal pain 04/07/2019   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   Polyarthralgia 03/05/2019   Thrombocytopenia ([place]) 03/05/2019   Pain due to onychomycosis of toenails of both feet 11/20/2018   Diabetic neuropathy ([place]) 11/20/2018   Influenza A 06/22/2018   Community acquired pneumonia 06/22/2018   Arthritis 05/12/2017   Varicose veins of leg with swelling, bilateral 01/18/2017   Facial twitching    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 ([place]) 02/24/2015   Pneumonia 10/12/2014   Osteoarthritis of right knee 02/11/2014   Pain and swelling of knee 01/08/2014   Pain in extremity 01/08/2014   Weakness due to cerebrovascular accident 01/08/2014   Pulmonary embolism ([place]) 06/19/2012   Gout 09/22/2011   CVA, old, speech/language deficit 09/01/2011   Cerebral infarction ([place]) 04/26/2011  Type 2 diabetes mellitus with diabetic polyneuropathy, without long-term current use of insulin ([place]) 04/26/2011   Accelerated hypertension 04/26/2011   Anemia 04/26/2011   DVT (deep venous thrombosis) ([place]) 04/26/2011   Essential hypertension 04/26/2011    Orientation RESPIRATION BLADDER Height &  Weight     Self, Place  Normal Incontinent Weight: 181 lb 14.1 oz (82.5 kg) Height:  '6\' 2"'$  (188 cm)  BEHAVIORAL SYMPTOMS/MOOD NEUROLOGICAL BOWEL NUTRITION STATUS   (None)  (None) Continent Diet (Regular diet)  AMBULATORY STATUS COMMUNICATION OF NEEDS Skin   Limited Assist Verbally Normal                       Personal Care Assistance Level of Assistance  Bathing, Feeding, Dressing Bathing Assistance: Limited assistance Feeding assistance: Limited assistance Dressing Assistance: Limited assistance     Functional Limitations Info  Sight, Hearing, Speech Sight Info: Impaired Hearing Info: Adequate Speech Info: Impaired (Expressive aphasia, Slurred/dysarthria)    SPECIAL CARE FACTORS FREQUENCY  PT (By licensed PT), OT (By licensed OT)     PT Frequency: 5 x week OT Frequency: 5 x week            Contractures Contractures Info: Not present    Additional Factors Info  Code Status, Allergies Code Status Info: Full code Allergies Info: NKDA           Current Medications (07/21/2022):  This is the current hospital active medication list Current Facility-Administered Medications  Medication Dose Route Frequency Provider Last Rate Last Admin   acetaminophen (TYLENOL) tablet 650 mg  650 mg Oral Q6H PRN Gertie Fey, MD   650 mg at 07/20/22 2108   Or   acetaminophen (TYLENOL) suppository 650 mg  650 mg Rectal Q6H PRN Gertie Fey, MD       allopurinol (ZYLOPRIM) tablet 100 mg  100 mg Oral Daily Gertie Fey, MD   100 mg at 07/21/22 [id number]   docusate sodium (COLACE) capsule 100 mg  100 mg Oral BID Damita Lack, MD   100 mg at 07/21/22 0921   enoxaparin (LOVENOX) injection 42.5 mg  42.5 mg Subcutaneous Once Alison Murray, RPH       enoxaparin (LOVENOX) injection 82.5 mg  1 mg/kg Subcutaneous Q12H Alison Murray, RPH   82.5 mg at 07/21/22 [id number]   ferrous sulfate tablet 325 mg  325 mg Oral Q breakfast Amin, Ankit Chirag, MD   325 mg at 07/21/22 0921   guaiFENesin  (ROBITUSSIN) 100 MG/5ML liquid 5 mL  5 mL Oral Q4H PRN Damita Lack, MD   5 mL at 07/20/22 2326   hydrALAZINE (APRESOLINE) injection 10 mg  10 mg Intravenous Q4H PRN Amin, Ankit Chirag, MD       ipratropium-albuterol (DUONEB) 0.5-2.5 (3) MG/3ML nebulizer solution 3 mL  3 mL Nebulization Q4H PRN Amin, Ankit Chirag, MD       latanoprost (XALATAN) 0.005 % ophthalmic solution 1 drop  1 drop Both Eyes QHS Gertie Fey, MD   1 drop at 07/20/22 2238   lidocaine-prilocaine (EMLA) cream   Topical Once PRN Gertie Fey, MD       ondansetron [facility]) injection 4 mg  4 mg Intravenous Q6H PRN Amin, Ankit Chirag, MD       ondansetron (ZOFRAN) tablet 8 mg  8 mg Oral Q8H PRN Gertie Fey, MD       oxyCODONE (Oxy IR/ROXICODONE) immediate release tablet 5 mg  5 mg Oral Q6H PRN Gertie Fey, MD  5 mg at 07/20/22 1821   pamidronate (AREDIA) 60 mg in sodium chloride 0.9 % 500 mL IVPB  60 mg Intravenous Once Sindy Guadeloupe, MD 130 mL/hr at 07/21/22 1058 60 mg at 07/21/22 1058   polyethylene glycol (MIRALAX / GLYCOLAX) packet 17 g  17 g Oral Daily PRN Gertie Fey, MD       senna (SENOKOT) tablet 8.6 mg  1 tablet Oral BID Gertie Fey, MD   8.6 mg at 07/21/22 [id number]   senna-docusate (Senokot-S) tablet 1 tablet  1 tablet Oral QHS PRN Amin, Ankit Chirag, MD       simvastatin (ZOCOR) tablet 20 mg  20 mg Oral QHS Gertie Fey, MD   20 mg at 07/20/22 2108   sodium chloride flush (NS) 0.9 % injection 3 mL  3 mL Intravenous Q12H Gertie Fey, MD   3 mL at 07/19/22 2334   traZODone (DESYREL) tablet 50 mg  50 mg Oral QHS PRN Damita Lack, MD   50 mg at 07/20/22 2326     Discharge Medications: Please see discharge summary for a list of discharge medications.  Relevant Imaging Results:  Relevant Lab Results:   Additional Information SS#: [id number]. Will get chemo/radiation on same day once per week for 7 weeks. Start Monday 3/11. At [facility] 2/16-3/4 (about 17 days). Need facility that can work out payment plan  after rehab.  Candie Chroman, LCSW

## 2022-07-21 NOTE — TOC Progression Note (Addendum)
Transition of Care [facility]) - Progression Note    Patient Details  Name: [name] MRN: [id number] Date of Birth: [birth year]  Transition of [facility]) CM/SW [place], LCSW Phone Number: 07/21/2022, 10:16 AM  Clinical Narrative:   Received call back from [facility]. She said they will not be able to afford to pay 7 days of copays up front. Will look for facility that can work out payment plan after rehab.  11:42 am: Per oncology palliative NP, patient will have chemo and radiation on the same day once per week for 7 weeks. Included this information on SNF referral. This will start Monday 3/11.  Expected Discharge Plan: [place] Barriers to Discharge: Continued Medical Work up  Expected Discharge Plan and Services     Post Acute Care Choice: [place] Living arrangements for the past 2 months: Single Family Home                                       Social Determinants of Health (SDOH) Interventions SDOH Screenings   Food Insecurity: No Food Insecurity (07/20/2022)  Housing: Low Risk  (07/20/2022)  Transportation Needs: No Transportation Needs (07/20/2022)  Utilities: Not At Risk (07/20/2022)  Alcohol Screen: Low Risk  (06/21/2022)  Depression (PHQ2-9): Medium Risk (06/18/2022)  Financial Resource Strain: Low Risk  (06/21/2022)  Physical Activity: Inactive (06/21/2022)  Social Connections: Socially Isolated (06/21/2022)  Stress: Stress Concern Present (06/21/2022)  Tobacco Use: High Risk (07/19/2022)    Readmission Risk Interventions    01/29/2020   12:41 PM  Readmission Risk Prevention Plan  Transportation Screening Complete  Palliative Care Screening Not Applicable  Medication Review (RN Care Manager) Complete

## 2022-07-21 NOTE — Progress Notes (Signed)
PT Cancellation Note  Patient Details Name: [name] MRN: [id number] DOB: [birth year]   Cancelled Treatment:    Reason Eval/Treat Not Completed: Other (comment) (Pt sitting EOB, just started breakfast. Will defer therapy at this time, return later date/time. Please see evaluation note from previous day for DC planning needs.)  12:36 PM, 07/21/22 Etta Grandchild, PT, DPT Physical Therapist - [facility]  [phone] ([place])    [place] C 07/21/2022, 12:36 PM

## 2022-07-21 NOTE — Progress Notes (Addendum)
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  Patient was found to have hypercalcemia complicated by acute kidney injury, hyponatremia and pain and discomfort.  Initially started on calcitonin which improved his calcium levels.  He was seen by oncology and eventually recommended starting him pamidronate with outpatient follow-up.  PT/OT recommended SNF therefore arrangements being made.  Hospitalist service has also been consulted by oncology team.   Assessment & Plan:  Principal Problem:   Hypercalcemia Active Problems:   Right shoulder pain   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HCC)   Hyponatremia   AKI (acute kidney injury) ([place])   History of DVT (deep vein thrombosis)   History of pulmonary embolus (PE)   AMS (altered mental status)   Hypotension     Assessment and Plan: * Hypercalcemia; 13.3 For now calcium levels have improved with IV fluids and calcitonin but I suspect recurrent issues while undergoing cancer treatment.  Discussed with Dr. Janese Banks was started patient on pamidronate.  Patient will be getting outpatient chemotherapy and radiation starting next week.  For now PT/OT is recommending SNF therefore TOC will work out logistics to see if he can get simultaneous therapy and chemo. Palliative care consulted by oncology  Hyponatremia Improved  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HCC) Squamous cell cancer of the lungs Follows with Dr. Janese Banks  Right shoulder pain Appears to be chronic secondary to bony and T1 invasion.  Bradycardiac with Pauses [facility] Cardiology consulted. May need a pacemaker.   AKI (acute kidney injury) ([place]) Baseline creatinine 0.8.   Admission creatinine 1.5, improving with IV fluids.  History of pulmonary embolus (PE); 2018 & 2023 Will place him on Lovenox '1mg'$ /kg. Resume Xarelto upon dc.   Hypotension IVF  AMS (altered mental status); Improved.  CT head is negative. Will monitor.   Chronic A fib CHF ef 50% On Xarelto at home.       DVT prophylaxis: Lovenox '1mg'$ /kg Code Status: Full  Family Communication:    Status is: Inpatient Getting treatment for HyperCa    Subjective: Still feeling weak, no complaints.  Does have expressive aphasia which has been chronic. Tele showed sinus pauses with bradycardia overnight. Examination:  General exam: Appears calm and comfortable, cachectic frail Respiratory system: Bibasilar crackles Cardiovascular system: S1 & S2 heard, RRR. No JVD, murmurs, rubs, gallops or clicks.  Gastrointestinal system: Abdomen is nondistended, soft and nontender. No organomegaly or masses felt. Normal bowel sounds heard. Central nervous system: Alert and oriented.  Chronic right-sided weakness and expressive aphasia Extremities: Symmetric 4 x 5 power. Skin: No rashes, lesions or ulcers Psychiatry: Judgement and insight appear normal. Mood & affect appropriate.     Objective: Vitals:   07/20/22 2009 07/20/22 2335 07/21/22 0436 07/21/22 0815  BP: 118/89 122/78 121/68 113/88  Pulse: 79 84 69 67  Resp: '20  18 16  '$ Temp: 98.3 F (36.8 C)  (!) 97.5 F (36.4 C) 97.9 F (36.6 C)  TempSrc: Oral   Oral  SpO2: 99% 100% 99% 100%  Weight:      Height:        Intake/Output Summary (Last 24 hours) at 07/21/2022 1112 Last data filed at 07/21/2022 1000 Gross per 24 hour  Intake 1270 ml  Output 900 ml  Net 370 ml   Filed Weights   07/19/22 1926  Weight: 82.5 kg     Data Reviewed:   CBC: Recent Labs  Lab 07/19/22 1946 07/19/22 2319 07/20/22 0541 07/21/22 0543  WBC 7.9 8.2 8.6 8.2  HGB 10.2* 9.6* 9.8* 8.9*  HCT 34.0* 31.8* 31.9* 28.5*  MCV 81.0 79.9* 78.6* 77.7*  PLT 219 221  210 [id number]   Basic Metabolic Panel: Recent Labs  Lab 07/19/22 1946 07/19/22 2319 07/20/22 0541 07/20/22 1519 07/21/22 0112 07/21/22 0543  NA 130*  --  129*  --  134* 134*  K 5.1  --  4.3  --  4.3 4.3  CL 94*  --  98  --  101 102  CO2 26  --  24  --  24 23  GLUCOSE 119*  --  143*  --  120* 122*  BUN 43*  --  37*  --  29* 26*  CREATININE 1.50* 1.37* 1.08  --  0.86 0.82  CALCIUM 13.3*  --  12.4* 11.9* 11.6* 11.3*  MG 2.3  --   --   --  1.9 1.8  PHOS  --  2.8  --   --   --   --    GFR: Estimated Creatinine Clearance: 93.3 mL/min (by C-G formula based on SCr of 0.82 mg/dL). Liver Function Tests: Recent Labs  Lab 07/19/22 1946  AST 31  ALT 22  ALKPHOS 70  BILITOT 0.9  PROT 8.0  ALBUMIN 3.4*   No results for input(s): "LIPASE", "AMYLASE" in the last 168 hours. No results for input(s): "AMMONIA" in the last 168 hours. Coagulation Profile: Recent Labs  Lab 07/19/22 2319  INR 1.2   Cardiac Enzymes: No results for input(s): "CKTOTAL", "CKMB", "CKMBINDEX", "TROPONINI" in the last 168 hours. BNP (last 3 results) No results for input(s): "PROBNP" in the last 8760 hours. HbA1C: No results for input(s): "HGBA1C" in the last 72 hours. CBG: No results for input(s): "GLUCAP" in the last 168 hours. Lipid Profile: No results for input(s): "CHOL", "HDL", "LDLCALC", "TRIG", "CHOLHDL", "LDLDIRECT" in the last 72 hours. Thyroid Function Tests: Recent Labs    07/21/22 0112 07/21/22 0540  TSH 0.219*  --   FREET4  --  1.35*   Anemia Panel: No results for input(s): "VITAMINB12", "FOLATE", "FERRITIN", "TIBC", "IRON", "RETICCTPCT" in the last 72 hours. Sepsis Labs: No results for input(s): "PROCALCITON", "LATICACIDVEN" in the last 168 hours.  Recent Results (from the past 240 hour(s))  MRSA Next Gen by PCR, Nasal     Status: None   Collection Time: 07/20/22 11:47 AM   Specimen: Nasal Mucosa; Nasal Swab  Result Value Ref Range Status   MRSA by PCR Next Gen NOT DETECTED NOT  DETECTED Final    Comment: (NOTE) The GeneXpert MRSA Assay (FDA approved for NASAL specimens only), is one component of a comprehensive MRSA colonization surveillance program. It is not intended to diagnose MRSA infection nor to guide or monitor treatment for MRSA infections. Test performance is not FDA approved in patients less than 105 years old. Performed at [facility], [address], [place], [place] [address]          Radiology Studies: CT CHEST WO CONTRAST  Result Date: 07/19/2022 CLINICAL DATA:  Altered mental status, and abnormal labs with low calcium. 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 EXAM: CT HEAD WITHOUT CONTRAST CT CHEST WITHOUT CONTRAST TECHNIQUE: Contiguous axial  images were obtained from the base of the skull through the vertex without intravenous contrast, with multiplanar reconstructions. Multidetector CT imaging of the chest was performed following the standard protocol without IV contrast, with multiplanar reconstructions. RADIATION DOSE REDUCTION: This exam was performed according to the departmental dose-optimization program which includes automated exposure control, adjustment of the mA and/or kV according to patient size and/or use of iterative reconstruction technique. COMPARISON:  CT scan head 06/25/2022, PET-CT 06/25/2022, chest CT without contrast 06/11/2022. FINDINGS: CT HEAD FINDINGS Brain: Again noted are chronic bilateral occipital lobe infarcts, and encephalomalacia due to a chronic left frontoparietal infarct. A linear lacunar infarct is also again noted superiorly in the right cerebellar hemisphere. No new asymmetry is seen concerning for a cortical based acute infarct, hemorrhage, mass effect or shift of the midline. There is mild atrophy, small-vessel disease and atrophic ventriculomegaly with persistent cavum septi pellucidi and cavum vergae. There are benign dural calcifications along the  falx. Vascular: There are calcifications in both siphons, distal vertebral arteries. No hyperdense central vessels. Skull: Negative for fractures or focal lesions. Sinuses/Orbits: Old lens replacements. Otherwise negative orbits. There is mild membrane disease in the right ethmoid air cells. Other visible sinuses, mastoid air cells and middle ears are clear. Other: None. CT CHEST FINDINGS Cardiovascular: There is three-vessel coronary artery calcification. Aortic atherosclerosis and tortuosity without evidence of aneurysm. There is mild-to-moderate cardiomegaly. No pericardial effusion. Enlarged pulmonary trunk 4 cm indicating arterial hypertension. The pulmonary veins are decompressed. There are scattered calcifications in the great vessels. Mediastinum/Nodes: Previously tracer avid right mid hilar lymph node just above the distal right main pulmonary artery, today is estimated at 2.4 cm in short axis, previously 2.1 cm. There are stable shotty subcentimeter in short axis right paratracheal, pretracheal and subcarinal nodes. No other enlarged nodes are visible without contrast. Lungs/Pleura: There is again noted an ill-defined right apical invasive lung mass, with extension into the supraclavicular fossa, difficult to accurately measure without the benefit of IV contrast. It is not fully imaged at its upper extent but has enlarged in the AP and transverse axis. Today this measures 9 x 7.5 cm coronal and AP, previously 8.7 x 6 cm. There are increased destructive changes especially to the right first rib posterior and lateral portions, increased erosive change to the posterior right second and third ribs, and increased erosion into the right side of the T1 vertebral body, the right T1 pedicle and the right T1 transverse process and right side of the dorsal T1 arch. Associated with this, there is increased tumor encroachment on the right anterior aspect of the thecal sac at T1 and on the right T1-2 exit foramen. The  lungs are mildly emphysematous with paraseptal changes predominating. There are asymmetric pleural-parenchymal scar-like opacities underlying the right apical lung mass, and bilaterally scattered areas of subpleural reticulation. There is a calcified granuloma in the lingula. Stable 1.5 cm pleural-based nodule in the posteromedial extreme right base on 5:143. Remaining lungs are generally clear. No pneumonic process is seen. There are retained or aspirated secretions in the proximal right main bronchus. Upper abdomen: No mass or acute abnormality is seen. Musculoskeletal: See above regarding tumor erosive changes to the right first, second and third ribs and right side of T1. There is osteopenia with degenerative changes of the spine. No other destructive bone lesions are seen. There are degenerative changes of the thoracic spine and multilevel bridging enthesopathy. IMPRESSION: 1. No acute intracranial CT findings or interval changes. Old infarcts. Chronic change.  2. 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 3. Increased size of a right mid hilar lymph node. 4. Stable 1.5 cm pleural-based nodule in the posteromedial extreme right base. 5. Aortic and coronary artery atherosclerosis. 6. Emphysema. 7. Retained or aspirated secretions in the proximal right main bronchus. Aortic Atherosclerosis (ICD10-I70.0) and Emphysema (ICD10-J43.9). Electronically Signed   By: Telford Nab M.D.   On: 07/19/2022 23:34   CT HEAD WO CONTRAST (5MM)  Result Date: 07/19/2022 CLINICAL DATA:  Altered mental status, and abnormal labs with low calcium. 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 EXAM: CT HEAD WITHOUT CONTRAST CT CHEST WITHOUT CONTRAST TECHNIQUE: Contiguous axial images were obtained  from the base of the skull through the vertex without intravenous contrast, with multiplanar reconstructions. Multidetector CT imaging of the chest was performed following the standard protocol without IV contrast, with multiplanar reconstructions. RADIATION DOSE REDUCTION: This exam was performed according to the departmental dose-optimization program which includes automated exposure control, adjustment of the mA and/or kV according to patient size and/or use of iterative reconstruction technique. COMPARISON:  CT scan head 06/25/2022, PET-CT 06/25/2022, chest CT without contrast 06/11/2022. FINDINGS: CT HEAD FINDINGS Brain: Again noted are chronic bilateral occipital lobe infarcts, and encephalomalacia due to a chronic left frontoparietal infarct. A linear lacunar infarct is also again noted superiorly in the right cerebellar hemisphere. No new asymmetry is seen concerning for a cortical based acute infarct, hemorrhage, mass effect or shift of the midline. There is mild atrophy, small-vessel disease and atrophic ventriculomegaly with persistent cavum septi pellucidi and cavum vergae. There are benign dural calcifications along the falx. Vascular: There are calcifications in both siphons, distal vertebral arteries. No hyperdense central vessels. Skull: Negative for fractures or focal lesions. Sinuses/Orbits: Old lens replacements. Otherwise negative orbits. There is mild membrane disease in the right ethmoid air cells. Other visible sinuses, mastoid air cells and middle ears are clear. Other: None. CT CHEST FINDINGS Cardiovascular: There is three-vessel coronary artery calcification. Aortic atherosclerosis and tortuosity without evidence of aneurysm. There is mild-to-moderate cardiomegaly. No pericardial effusion. Enlarged pulmonary trunk 4 cm indicating arterial hypertension. The pulmonary veins are decompressed. There are scattered calcifications in the great vessels. Mediastinum/Nodes: Previously tracer avid  right mid hilar lymph node just above the distal right main pulmonary artery, today is estimated at 2.4 cm in short axis, previously 2.1 cm. There are stable shotty subcentimeter in short axis right paratracheal, pretracheal and subcarinal nodes. No other enlarged nodes are visible without contrast. Lungs/Pleura: There is again noted an ill-defined right apical invasive lung mass, with extension into the supraclavicular fossa, difficult to accurately measure without the benefit of IV contrast. It is not fully imaged at its upper extent but has enlarged in the AP and transverse axis. Today this measures 9 x 7.5 cm coronal and AP, previously 8.7 x 6 cm. There are increased destructive changes especially to the right first rib posterior and lateral portions, increased erosive change to the posterior right second and third ribs, and increased erosion into the right side of the T1 vertebral body, the right T1 pedicle and the right T1 transverse process and right side of the dorsal T1 arch. Associated with this, there is increased tumor encroachment on the right anterior aspect of the thecal sac at  T1 and on the right T1-2 exit foramen. The lungs are mildly emphysematous with paraseptal changes predominating. There are asymmetric pleural-parenchymal scar-like opacities underlying the right apical lung mass, and bilaterally scattered areas of subpleural reticulation. There is a calcified granuloma in the lingula. Stable 1.5 cm pleural-based nodule in the posteromedial extreme right base on 5:143. Remaining lungs are generally clear. No pneumonic process is seen. There are retained or aspirated secretions in the proximal right main bronchus. Upper abdomen: No mass or acute abnormality is seen. Musculoskeletal: See above regarding tumor erosive changes to the right first, second and third ribs and right side of T1. There is osteopenia with degenerative changes of the spine. No other destructive bone lesions are seen. There  are degenerative changes of the thoracic spine and multilevel bridging enthesopathy. IMPRESSION: 1. No acute intracranial CT findings or interval changes. Old infarcts. Chronic change. 2. 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 3. Increased size of a right mid hilar lymph node. 4. Stable 1.5 cm pleural-based nodule in the posteromedial extreme right base. 5. Aortic and coronary artery atherosclerosis. 6. Emphysema. 7. Retained or aspirated secretions in the proximal right main bronchus. Aortic Atherosclerosis (ICD10-I70.0) and Emphysema (ICD10-J43.9). Electronically Signed   By: Telford Nab M.D.   On: 07/19/2022 23:34        Scheduled Meds:  allopurinol  100 mg Oral Daily   docusate sodium  100 mg Oral BID   enoxaparin (LOVENOX) injection  42.5 mg Subcutaneous Once   enoxaparin (LOVENOX) injection  1 mg/kg Subcutaneous Q12H   ferrous sulfate  325 mg Oral Q breakfast   latanoprost  1 drop Both Eyes QHS   senna  1 tablet Oral BID   simvastatin  20 mg Oral QHS   sodium chloride flush  3 mL Intravenous Q12H   Continuous Infusions:  pamidronate 60 mg (07/21/22 1058)     LOS: 2 days   Time spent= 35 mins    Carla Rashad Arsenio Loader, MD [facility]  If 7PM-7AM, please contact night-coverage  07/21/2022, 11:12 AM

## 2022-07-21 NOTE — Consult Note (Signed)
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 Radiol. 2015 Feb;12(2): 143-50). Disc levels: Mild thoracic disc degeneration and more widespread thoracic facet hypertrophy. Mild spinal stenosis and severe right neural foraminal stenosis at T2-3 due to severe right facet arthrosis and right-sided ligamentum flavum calcification. IMPRESSION: Known large right apical lung malignancy with extensive involvement of the T1 vertebral body and posterior elements and milder involvement of C7 and T2. Extension into the right neural foramina at C7-T1 and T1-2. No significant epidural tumor in the spinal canal itself. Electronically Signed   By: ALogan BoresM.D.   On: 06/28/2022 14:52   MR BRAIN W WO CONTRAST  Result Date: 06/28/2022 CLINICAL DATA:  Metastatic disease evaluation. Recently diagnosed lung cancer. Dizziness.  EXAM: MRI HEAD WITHOUT AND WITH CONTRAST TECHNIQUE: Multiplanar, multiecho pulse sequences of the brain and surrounding structures were obtained without and with intravenous contrast. CONTRAST:  84m GADAVIST GADOBUTROL 1 MMOL/ML IV SOLN COMPARISON:  Head CT 06/25/2022 and  MRI 09/12/2019. Head and neck CTA 09/12/2019. FINDINGS: Brain: There is no evidence of an acute infarct, mass, midline shift, or extra-axial fluid collection. Chronic left MCA and bilateral PCA infarcts are again noted with associated hemosiderin deposition which is greatest in the right MCA infarct. Elsewhere, small T2 hyperintensities elsewhere in the cerebral white matter and patchy T2 hyperintensity in the pons are nonspecific but compatible with mild chronic small vessel ischemic disease, similar to the prior MRI. A small chronic right cerebellar infarct is unchanged from the prior MRI. There is mild cerebral atrophy. A cavum septum pellucidum et vergae is noted. No abnormal enhancement is identified, although postcontrast imaging is moderately motion degraded which reduces the sensitivity for detection of very small lesions. Vascular: Small distal right vertebral artery, likely with chronically diminished flow or possibly chronic occlusion. Skull and upper cervical spine: No suspicious marrow lesion. Sinuses/Orbits: Bilateral cataract extraction. Minimal mucosal thickening in the ethmoid sinuses. Clear mastoid air cells. Other: None. IMPRESSION: 1. No evidence of intracranial metastases. 2. Multiple chronic infarcts as above. Electronically Signed   By: ALogan BoresM.D.   On: 06/28/2022 14:00   NM PET Image Initial (PI) Skull Base To Thigh  Result Date: 06/26/2022 CLINICAL DATA:  Initial treatment strategy for lung mass. EXAM: NUCLEAR MEDICINE PET SKULL BASE TO THIGH TECHNIQUE: 9.55 mCi F-18 FDG was injected intravenously. Full-ring PET imaging was performed from the skull base to thigh after the radiotracer. CT data was obtained and used for attenuation correction and anatomic localization. Fasting blood glucose: 107 mg/dl COMPARISON:  CT chest 06/11/2022 FINDINGS: Mediastinal blood pool activity: SUV max 2.11 Liver activity: SUV max NA NECK: No tracer avid lymph nodes within the neck. Incidental CT  findings: None. CHEST: Right apical lung mass is again noted. This measures 8.7 x 6.0 by 6.45 and has an SUV max of 23.36, image 66/2. As mentioned previously chest wall invasion is identified with a roast of changes involving the posterior aspect of the right first and second ribs as well as the T1 transverse process and right-side of the T1 vertebra. Tracer avid right hilar lymph node measures 2.1 cm within SUV max of 13.24. Incidental CT findings: Aortic atherosclerosis. Coronary artery calcifications. Mild cardiac enlargement. ABDOMEN/PELVIS: No abnormal hypermetabolic activity within the liver, pancreas, adrenal glands, or spleen. No hypermetabolic lymph nodes in the abdomen or pelvis. Incidental CT findings: Aortic atherosclerosis. Filter identified within the IVC. Scarring, calcification in volume loss noted involving the upper pole of the left kidney. SKELETON: No focal hypermetabolic activity to suggest skeletal metastasis. Incidental CT findings: There is no significant tracer uptake associated with the previously characterized lucent bone lesion involving T11 vertebral body which has been present since 04/07/2019 and may represent a benign hemangioma. IMPRESSION: 1. Right apical lung mass is intensely FDG avid compatible with primary bronchogenic carcinoma. There is evidence for chest wall invasion with erosive changes involving posterior aspect right first and second ribs and right-side of the T1 vertebra and transverse process. The right first and second ribs as well as the T1 transverse process and right-side of the T1 vertebra. MRI of the thoracic spine may be helpful to assess for neural foraminal involvement and spinal canal involvement at this level. 2. FDG avid right hilar lymph  node compatible with nodal metastasis. 3. No signs of FDG avid distant metastatic disease. 4. Aortic Atherosclerosis (ICD10-I70.0). Coronary artery calcifications. Electronically Signed   By: Kerby Moors M.D.   On:  06/26/2022 18:15   CT Head Wo Contrast  Result Date: 06/25/2022 CLINICAL DATA:  Generalized weakness.  History of bilateral CVAs. EXAM: CT HEAD WITHOUT CONTRAST TECHNIQUE: Contiguous axial images were obtained from the base of the skull through the vertex without intravenous contrast. RADIATION DOSE REDUCTION: This exam was performed according to the departmental dose-optimization program which includes automated exposure control, adjustment of the mA and/or kV according to patient size and/or use of iterative reconstruction technique. COMPARISON:  Head CT 06/11/2022. FINDINGS: Brain: Again noted are chronic infarcts in the left frontoparietal area and in the right-greater-than-left occipital lobes, linear lacunar infarct superior right cerebellar hemisphere, and mild atrophy, small-vessel disease and atrophic ventriculomegaly with persistent cavum septi pellucidi and cavum vergae. No midline shift is seen. Benign dural calcifications are scattered along the falx. No new asymmetry is seen concerning for an acute cortical based infarct, hemorrhage, mass or mass effect. Basal cisterns are clear. There is stable mild ex vacuo prominence of the body of the left lateral ventricle and occipital horn of right lateral ventricle. Vascular: Patchy calcification noted both siphons, left distal vertebral artery. No hyperdense central vessels. Skull: Negative for fractures or focal lesions. Sinuses/Orbits: There are old lens replacements. No acute orbital findings. Mild chronic disease in the ethmoids, both maxillary sinuses. The frontal and sphenoid sinus, bilateral mastoid air cells, and middle ears are clear. Other: None. IMPRESSION: 1. No acute intracranial CT findings or interval changes. 2. 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 3. Sinus disease. Electronically Signed   By: Telford Nab M.D.   On: 06/25/2022 21:40   DG Chest Portable 1 View  Result Date: 06/25/2022 CLINICAL DATA:  Weakness EXAM: PORTABLE  CHEST 1 VIEW COMPARISON:  06/11/2022 FINDINGS: Stable cardiomegaly. Right apical mass with destructive changes of the adjacent right first and second ribs, not appreciably changed from the previous exam. Elsewhere, lungs are clear. No pleural effusion or pneumothorax. IMPRESSION: Right apical mass with destructive changes of the adjacent right first and second ribs, not appreciably changed from the previous exam. Electronically Signed   By: Davina Poke D.O.   On: 06/25/2022 21:31    ECHO 09/2021  1. Left ventricular ejection fraction, by estimation, is 45 to 50%. The  left ventricle has mildly decreased function. The left ventricle  demonstrates global hypokinesis. There is mild left ventricular  hypertrophy. Left ventricular diastolic parameters  are indeterminate.   2. Right ventricular systolic function is mildly reduced. The right  ventricular size is mildly enlarged. Tricuspid regurgitation signal is  inadequate for assessing PA pressure.   3. Left atrial size was severely dilated.   4. Right atrial size was severely dilated.   5. The mitral valve is abnormal. Mild mitral valve regurgitation.   6. The aortic valve is tricuspid. There is mild calcification of the  aortic valve. There is mild thickening of the aortic valve. Aortic valve  regurgitation is trivial.   7. The inferior vena cava is dilated in size with >50% respiratory  variability, suggesting right atrial pressure of 8 mmHg.   TELEMETRY reviewed by me (LT) 07/21/2022 : AF rate predominantly 70s-80s, multiple 2-4 sec sinus pauses, longest 7.3 sec overnight  EKG reviewed by me: AF  79 BPM  Data reviewed by me (LT) 07/21/2022: hospitalist progress note, cross cover  note, last 24h vitals tele labs imaging I/O    Principal Problem:   Hypercalcemia Active Problems:   AKI (acute kidney injury) ([place])   Right shoulder pain   History of DVT (deep vein thrombosis)   History of pulmonary embolus (PE)   Pancoast tumor of right  lung (HCC)   Hyponatremia   AMS (altered mental status)   Hypotension    ASSESSMENT AND PLAN:  [name] is a 61yoM with a PMH of chronic atrial fibrillation on Eliquis, HFrEF (EF 40-45%), hx CVA with residual expressive aphasia, hx DVT/PE, HTN, HLD, DM2, 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  He has been treated w IV fluids and calcitonin with improvement. Overnight from 3/5 - 3/6 nursing staff notified cross coverage of multiple sinus pauses on telemetry, the longest 7.4 seconds while patient was sleeping.  Cardiology is consulted for further assistance.  # Hypercalcemia # RUL SCC with bone metastasis, pending chemo and radiation # hx CVA with residual expressive aphasia Presented from [facility] with serum calcium at 14, improvement after IV fluids and calcitonin, oncology following, planning to start chemo and radiation within the next 2 weeks  # Sinus pauses # Chronic atrial fibrillation Noted on telemetry by nursing staff with multiple 2-4-second sinus pauses, the longest pause of concern at 7.3 seconds while the patient was sleeping.  Has not received metoprolol since admission, electrolytes are normalizing with IV fluids, K and mag within normal limits.  Reported history of asymptomatic pauses overnight in 2021 previously attributed to untreated sleep apnea. -No indication for temporary transvenous pacemaker at this time -Monitor and correct electrolytes  -Hold BB, CCB -Trial of CPAP overnight -Continuous monitoring on telemetry -recommend further discussions regarding permanent pacemaker implantation with the patient's partner and oldest daughter to ascertain if this is within the patient's goals of care.  I spoke wiith the patient's oldest daughter by phone [name]) this afternoon who deferred making a decision for the patient today.  She wished to  continue conversations tomorrow morning while she and the patient's partner are present at bedside to facilitate communication re: goals of care, PPM indication, etc.  We will follow-up tomorrow morning  # Chronic HFrEF Euvolemic on exam. Hold home losartan as BP has been normal. Discontinue BB with sinus pauses overnight.   This patient's plan of care was discussed and created with Dr. Clayborn Bigness and he is in agreement.  Signed: Tristan Schroeder , PA-C 07/21/2022, 3:40 PM [facility]

## 2022-07-21 NOTE — Consult Note (Signed)
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 attenuation correction and anatomic localization. Fasting blood glucose: 107 mg/dl COMPARISON:  CT chest 06/11/2022 FINDINGS: Mediastinal blood pool activity: SUV max 2.11 Liver activity: SUV max NA NECK: No tracer avid lymph nodes within the neck. Incidental CT findings: None. CHEST: Right apical lung mass is again noted. This measures 8.7 x 6.0 by 6.45 and has an SUV max of 23.36, image 66/2. As mentioned previously chest wall invasion is identified with a roast of changes involving the posterior aspect of the right first and second ribs as well as the T1 transverse process and right-side of the T1 vertebra. Tracer avid right hilar lymph node measures 2.1 cm within SUV max of 13.24. Incidental CT findings: Aortic atherosclerosis. Coronary artery calcifications. Mild cardiac enlargement. ABDOMEN/PELVIS: No abnormal hypermetabolic activity within the liver, pancreas, adrenal glands, or spleen. No hypermetabolic lymph nodes in the abdomen or pelvis. Incidental CT findings: Aortic atherosclerosis. Filter identified within the IVC. Scarring, calcification in  volume loss noted involving the upper pole of the left kidney. SKELETON: No focal hypermetabolic activity  to suggest skeletal metastasis. Incidental CT findings: There is no significant tracer uptake associated with the previously characterized lucent bone lesion involving T11 vertebral body which has been present since 04/07/2019 and may represent a benign hemangioma. IMPRESSION: 1. Right apical lung mass is intensely FDG avid compatible with primary bronchogenic carcinoma. There is evidence for chest wall invasion with erosive changes involving posterior aspect right first and second ribs and right-side of the T1 vertebra and transverse process. The right first and second ribs as well as the T1 transverse process and right-side of the T1 vertebra. MRI of the thoracic spine may be helpful to assess for neural foraminal involvement and spinal canal involvement at this level. 2. FDG avid right hilar lymph node compatible with nodal metastasis. 3. No signs of FDG avid distant metastatic disease. 4. Aortic Atherosclerosis (ICD10-I70.0). Coronary artery calcifications. Electronically Signed   By: Kerby Moors M.D.   On: 06/26/2022 18:15   CT Head Wo Contrast  Result Date: 06/25/2022 CLINICAL DATA:  Generalized weakness.  History of bilateral CVAs. EXAM: CT HEAD WITHOUT CONTRAST TECHNIQUE: Contiguous axial images were obtained from the base of the skull through the vertex without intravenous contrast. RADIATION DOSE REDUCTION: This exam was performed according to the departmental dose-optimization program which includes automated exposure control, adjustment of the mA and/or kV according to patient size and/or use of iterative reconstruction technique. COMPARISON:  Head CT 06/11/2022. FINDINGS: Brain: Again noted are chronic infarcts in the left frontoparietal area and in the right-greater-than-left occipital lobes, linear lacunar infarct superior right cerebellar hemisphere, and mild atrophy, small-vessel disease  and atrophic ventriculomegaly with persistent cavum septi pellucidi and cavum vergae. No midline shift is seen. Benign dural calcifications are scattered along the falx. No new asymmetry is seen concerning for an acute cortical based infarct, hemorrhage, mass or mass effect. Basal cisterns are clear. There is stable mild ex vacuo prominence of the body of the left lateral ventricle and occipital horn of right lateral ventricle. Vascular: Patchy calcification noted both siphons, left distal vertebral artery. No hyperdense central vessels. Skull: Negative for fractures or focal lesions. Sinuses/Orbits: There are old lens replacements. No acute orbital findings. Mild chronic disease in the ethmoids, both maxillary sinuses. The frontal and sphenoid sinus, bilateral mastoid air cells, and middle ears are clear. Other: None. IMPRESSION: 1. No acute intracranial CT findings or interval changes. 2. 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 3. Sinus disease. Electronically Signed   By: Telford Nab M.D.   On: 06/25/2022 21:40   DG Chest Portable 1 View  Result Date: 06/25/2022 CLINICAL DATA:  Weakness EXAM: PORTABLE CHEST 1 VIEW COMPARISON:  06/11/2022 FINDINGS: Stable cardiomegaly. Right apical mass with destructive changes of the adjacent right first and second ribs, not appreciably changed from the previous exam. Elsewhere, lungs are clear. No pleural effusion or pneumothorax. IMPRESSION: Right apical mass with destructive changes of the adjacent right first and second ribs, not appreciably changed from the previous exam. Electronically Signed   By: Davina Poke D.O.   On: 06/25/2022 21:31    PERFORMANCE STATUS (ECOG) : 3 - Symptomatic, >50% confined to bed  Review of Systems Unable to complete  Physical Exam General: NAD Pulmonary: Unlabored Extremities: no edema, no joint deformities Skin: no rashes Neurological: Weakness, expressive aphasia  IMPRESSION: Patient is known to me from clinic and  his previous hospitalizations.  Of note, communication can be somewhat challenged by his neurological deficits from previous strokes.  Patient has expressive aphasia and I  was unable to meaningfully engage with him in a conversation regarding goals.  At present, he does endorse pain and seems to indicate that his pain is improved after utilization of oxycodone.  Note that patient has not received any oxycodone today and has used this sparingly since he was admitted to the hospital.  It appears that his MS Contin was discontinued at the SNF due to concerns that this would cause altered mental status.  Will start patient on low-dose transdermal fentanyl to provide him with better long-acting pain control.  I called and spoke with patient's significant other, [name].  Patient does not have a healthcare power of attorney, although [name] has been his primary caregiver for at home for many years.  Patient has two daughters with [name]. I called patient's oldest daughter, Marguerita Merles [phone].  I updated her on patient's medical status.  Family is hopeful that patient will be able to proceed with scheduled cancer treatment next week.  Daughter is hopeful that patient will be able to return home and receive outpatient care instead of him returning to an SNF.  She was not satisfied with the care that patient had received at rehab.  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  This was relayed to LCSW to help coordinate disposition.  We discussed CODE STATUS.  Patient's daughter states that family has previously discussed that with the patient and that he had wanted to remain a full code/full scope of treatment for now.  PLAN: -Continue current scope of treatment -Full code -Start transdermal fentanyl 12.5 mcg every 72 hours -Continue oxycodone as needed for breakthrough  pain -Daily bowel regimen -Patient pending initiation of concurrent chemoradiation next week -Family is considering taking patient home -TOC to coordinate discharge planning   Case and plan discussed with Dr. Janese Banks   Time Total: 60 minutes  Visit consisted of counseling and education dealing with the complex and emotionally intense issues of symptom management and palliative care in the setting of serious and potentially life-threatening illness.Greater than 50%  of this time was spent counseling and coordinating care related to the above assessment and plan.  Signed by: Altha Harm, PhD, NP-C

## 2022-07-22 ENCOUNTER — Other Ambulatory Visit (HOSPITAL_COMMUNITY): Payer: Self-pay | Admitting: Physician Assistant

## 2022-07-22 ENCOUNTER — Other Ambulatory Visit: Payer: Self-pay | Admitting: Radiology

## 2022-07-22 LAB — BASIC METABOLIC PANEL
Anion gap: 10 (ref 5–15)
BUN: 19 mg/dL (ref 8–23)
CO2: 22 mmol/L (ref 22–32)
Calcium: 11.2 mg/dL — ABNORMAL HIGH (ref 8.9–10.3)
Chloride: 100 mmol/L (ref 98–111)
Creatinine, Ser: 0.76 mg/dL (ref 0.61–1.24)
GFR, Estimated: >60 mL/min (ref >60)
Glucose, Bld: 102 mg/dL — ABNORMAL HIGH (ref 70–99)
Potassium: 3.8 mmol/L (ref 3.5–5.1)
Sodium: 132 mmol/L — ABNORMAL LOW (ref 135–145)

## 2022-07-22 LAB — CBC
HCT: 29.3 % — ABNORMAL LOW (ref 39.0–52.0)
Hemoglobin: 9.2 g/dL — ABNORMAL LOW (ref 13.0–17.0)
MCH: 24.1 pg — ABNORMAL LOW (ref 26.0–34.0)
MCHC: 31.4 g/dL (ref 30.0–36.0)
MCV: 76.7 fL — ABNORMAL LOW (ref 80.0–100.0)
Platelets: 201 10*3/uL (ref 150–400)
RBC: 3.82 MIL/uL — ABNORMAL LOW (ref 4.22–5.81)
RDW: 15.9 % — ABNORMAL HIGH (ref 11.5–15.5)
WBC: 7.8 10*3/uL (ref 4.0–10.5)
nRBC: 0 % (ref 0.0–0.2)

## 2022-07-22 LAB — MAGNESIUM: Magnesium: 1.8 mg/dL (ref 1.7–2.4)

## 2022-07-22 MED ORDER — OXYCODONE HCL 5 MG PO TABS
5.0000 mg | ORAL_TABLET | ORAL | Status: DC | PRN
  Administered 2022-07-22 – 2022-07-25 (×10): 10 mg via ORAL
  Administered 2022-07-25 (×2): 5 mg via ORAL
  Administered 2022-07-25 – 2022-07-27 (×9): 10 mg via ORAL
  Filled 2022-07-22 (×20): qty 2

## 2022-07-22 MED ORDER — FENTANYL 12 MCG/HR TD PT72
1.0000 | MEDICATED_PATCH | TRANSDERMAL | Status: DC
  Filled 2022-07-22: qty 1

## 2022-07-22 MED ORDER — ENOXAPARIN SODIUM 100 MG/ML IJ SOSY
1.0000 mg/kg | PREFILLED_SYRINGE | Freq: Two times a day (BID) | INTRAMUSCULAR | Status: AC
  Administered 2022-07-22 – 2022-07-25 (×6): 82.5 mg via SUBCUTANEOUS
  Filled 2022-07-22 (×6): qty 0.82

## 2022-07-22 MED ORDER — OXYCODONE HCL 5 MG PO TABS: 5.0000 mg | ORAL_TABLET | ORAL | Status: DC | PRN

## 2022-07-22 NOTE — Progress Notes (Signed)
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 surrounding structures were obtained without and with intravenous contrast. CONTRAST:  849mGADAVIST GADOBUTROL 1 MMOL/ML IV SOLN COMPARISON:  Head CT 06/25/2022 and MRI 09/12/2019. Head and neck CTA 09/12/2019. FINDINGS: Brain: There is no evidence of an acute infarct, mass, midline shift, or extra-axial fluid collection. Chronic left MCA and bilateral PCA infarcts are again noted with associated hemosiderin deposition which is greatest in the right MCA infarct. Elsewhere, small T2 hyperintensities elsewhere in the cerebral white matter and patchy T2 hyperintensity in the pons are nonspecific but compatible with mild chronic small vessel ischemic disease, similar to the prior MRI. A small chronic right cerebellar infarct is unchanged from the prior MRI. There is mild cerebral atrophy. A cavum septum pellucidum et vergae is noted. No abnormal enhancement is identified, although postcontrast imaging is moderately  motion degraded which reduces the sensitivity for detection of very small lesions. Vascular: Small distal  right vertebral artery, likely with chronically diminished flow or possibly chronic occlusion. Skull and upper cervical spine: No suspicious marrow lesion. Sinuses/Orbits: Bilateral cataract extraction. Minimal mucosal thickening in the ethmoid sinuses. Clear mastoid air cells. Other: None. IMPRESSION: 1. No evidence of intracranial metastases. 2. Multiple chronic infarcts as above. Electronically Signed   By: Logan Bores M.D.   On: 06/28/2022 14:00   NM PET Image Initial (PI) Skull Base To Thigh  Result Date: 06/26/2022 CLINICAL DATA:  Initial treatment strategy for lung mass. EXAM: NUCLEAR MEDICINE PET SKULL BASE TO THIGH TECHNIQUE: 9.55 mCi F-18 FDG was injected intravenously. Full-ring PET imaging was performed from the skull base to thigh after the radiotracer. CT data was obtained and used for attenuation correction and anatomic localization. Fasting blood glucose: 107 mg/dl COMPARISON:  CT chest 06/11/2022 FINDINGS: Mediastinal blood pool activity: SUV max 2.11 Liver activity: SUV max NA NECK: No tracer avid lymph nodes within the neck. Incidental CT findings: None. CHEST: Right apical lung mass is again noted. This measures 8.7 x 6.0 by 6.45 and has an SUV max of 23.36, image 66/2. As mentioned previously chest wall invasion is identified with a roast of changes involving the posterior aspect of the right first and second ribs as well as the T1 transverse process and right-side of the T1 vertebra. Tracer avid right hilar lymph node measures 2.1 cm within SUV max of 13.24. Incidental CT findings: Aortic atherosclerosis. Coronary artery calcifications. Mild cardiac enlargement. ABDOMEN/PELVIS: No abnormal hypermetabolic activity within the liver, pancreas, adrenal glands, or spleen. No hypermetabolic lymph nodes in the abdomen or pelvis. Incidental CT findings: Aortic atherosclerosis. Filter identified within the IVC. Scarring, calcification in volume loss noted involving the upper pole of the left  kidney. SKELETON: No focal hypermetabolic activity to suggest skeletal metastasis. Incidental CT findings: There is no significant tracer uptake associated with the previously characterized lucent bone lesion involving T11 vertebral body which has been present since 04/07/2019 and may represent a benign hemangioma. IMPRESSION: 1. Right apical lung mass is intensely FDG avid compatible with primary bronchogenic carcinoma. There is evidence for chest wall invasion with erosive changes involving posterior aspect right first and second ribs and right-side of the T1 vertebra and transverse process. The right first and second ribs as well as the T1 transverse process and right-side of the T1 vertebra. MRI of the thoracic spine may be helpful to assess for neural foraminal involvement and spinal canal involvement at this level. 2. FDG avid right hilar lymph node compatible with nodal metastasis. 3. No signs of FDG avid distant metastatic disease. 4. Aortic Atherosclerosis (ICD10-I70.0). Coronary artery calcifications. Electronically Signed   By: Kerby Moors M.D.   On: 06/26/2022 18:15   CT Head Wo Contrast  Result Date: 06/25/2022 CLINICAL DATA:  Generalized weakness.  History of bilateral CVAs. EXAM: CT HEAD WITHOUT CONTRAST TECHNIQUE: Contiguous axial images were obtained from the base of the skull through the vertex without intravenous contrast. RADIATION DOSE REDUCTION: This exam was performed according to the departmental dose-optimization program which includes automated exposure control, adjustment of the mA and/or kV according to patient size and/or use of iterative reconstruction technique. COMPARISON:  Head CT 06/11/2022. FINDINGS: Brain: Again noted are chronic infarcts in the left frontoparietal area and in the right-greater-than-left occipital lobes, linear lacunar infarct superior right cerebellar hemisphere, and mild atrophy, small-vessel disease and atrophic ventriculomegaly with persistent cavum  septi pellucidi and cavum vergae. No midline shift is seen. Benign dural calcifications are scattered along the falx. No new asymmetry is seen concerning for an acute cortical based infarct, hemorrhage, mass or mass effect. Basal cisterns are clear. There is stable mild ex vacuo prominence of the body of the left lateral ventricle and occipital horn of right lateral ventricle. Vascular: Patchy calcification noted both siphons, left distal vertebral artery. No hyperdense central vessels. Skull: Negative for fractures or focal lesions. Sinuses/Orbits: There are old lens replacements. No acute orbital findings. Mild chronic disease in the ethmoids, both maxillary sinuses. The frontal and sphenoid sinus, bilateral mastoid air cells, and middle ears are clear. Other: None. IMPRESSION: 1. No acute intracranial CT findings or interval changes. 2. 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 3. Sinus disease. Electronically Signed   By: Telford Nab M.D.   On: 06/25/2022 21:40   DG Chest Portable 1 View  Result Date: 06/25/2022 CLINICAL DATA:  Weakness EXAM: PORTABLE CHEST 1 VIEW COMPARISON:  06/11/2022 FINDINGS: Stable cardiomegaly. Right apical mass with destructive changes of the adjacent right first and second ribs, not appreciably changed from the previous exam. Elsewhere, lungs are clear. No pleural effusion or pneumothorax. IMPRESSION: Right apical mass with destructive changes of the adjacent right first and second ribs, not appreciably changed from the previous exam. Electronically Signed   By: Davina Poke D.O.   On: 06/25/2022 21:31    ECHO 09/2021  1. Left ventricular ejection fraction, by estimation, is 45 to 50%. The  left ventricle has mildly decreased function. The left ventricle  demonstrates global hypokinesis. There is mild left ventricular  hypertrophy. Left ventricular diastolic parameters  are indeterminate.   2. Right ventricular systolic function is mildly reduced. The right   ventricular size is mildly enlarged. Tricuspid regurgitation signal is  inadequate for assessing PA pressure.   3. Left atrial size was severely dilated.   4. Right atrial size was severely dilated.   5. The mitral valve is abnormal. Mild mitral valve regurgitation.   6. The aortic valve is tricuspid. There is mild calcification of the  aortic valve. There is mild thickening of the aortic valve. Aortic valve  regurgitation is trivial.   7. The inferior vena cava is dilated in size with >50% respiratory  variability, suggesting right atrial pressure of 8 mmHg.   TELEMETRY reviewed by me (LT) 07/22/2022 : AF rate predominantly 70s-80s, 1 instance of a 2.8-second pause noted on telemetry overnight without further long pauses  EKG reviewed by me: AF  79 BPM  Data reviewed by me (LT) 07/22/2022: hospitalist progress note, oncology progress note, last 24h vitals tele labs imaging I/O    Principal Problem:   Hypercalcemia Active Problems:   AKI (acute kidney injury) ([place])   Right shoulder pain   History of DVT (deep vein thrombosis)   History of pulmonary embolus (PE)   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HCC)   Hyponatremia   AMS (altered mental status)   Hypotension    ASSESSMENT AND PLAN:  [name] is a 27yoM with a PMH of chronic atrial fibrillation on Eliquis, HFrEF (EF 40-45%), hx CVA with residual expressive aphasia, hx DVT/PE, HTN, HLD, DM2, 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  He has been treated w IV fluids and calcitonin with improvement. Overnight from 3/5 - 3/6 nursing staff notified cross  coverage of multiple sinus pauses on telemetry, the longest 7.4 seconds while patient was sleeping.  Cardiology is consulted for further assistance.  # Hypercalcemia # RUL SCC with bone metastasis, pending chemo and radiation # hx CVA with residual expressive  aphasia Presented from [facility] with serum calcium at 14, improvement after IV fluids and calcitonin, oncology following, planning to start chemo and radiation within the next week  # Sinus pauses # Chronic atrial fibrillation Noted on telemetry by nursing staff with multiple 2-4-second sinus pauses, the longest pause of concern at 7.3 seconds while the patient was sleeping.  Has not received metoprolol since admission, electrolytes are normalizing with IV fluids, K and mag within normal limits.  Reported history of asymptomatic pauses overnight in 2021 previously attributed to untreated sleep apnea. -No indication for temporary transvenous pacemaker at this time -Monitor and correct electrolytes  -Hold BB, CCB -Trial of CPAP overnight/nocturnal [place] -Continuous monitoring on telemetry -lovenox for stroke prophylaxis for now, last dose Sunday 3/10 in the morning -Long discussion this morning with the patient, his significant other, and the patient's daughter at bedside re: indication for permanent pacemaker.  Ultimately, the patient and family are agreeable to proceeding with permanent pacemaker as well as pursuing chemo and radiation at this time.  Will plan for Micra leadless pacemaker implantation on Monday, March 11 at 7:30 AM with Dr. Isaias Cowman.  N.p.o. after midnight Sunday, March 10.   # Chronic HFrEF Euvolemic on exam. Hold home losartan as BP has been normal. Discontinue BB with sinus pauses overnight.   This patient's plan of care was discussed and created with Dr. Clayborn Bigness and he is in agreement.  Signed: Tristan Schroeder , PA-C 07/22/2022, 8:17 AM [facility]

## 2022-07-22 NOTE — Progress Notes (Signed)
Occupational Therapy Treatment Patient Details Name: [name] MRN: [id number] DOB: December 02, 1950 Today's Date: 07/22/2022   History of present illness [name] is a 78yoM who comes to [facility] on 07/19/22 after concerns of hypercalcemia on recent labs. 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   OT comments  Chart reviewed, pt greeted in bed initially agreeable to OT tx session. Pt has expressive deficits and appears oriented to self and place. Poor safety awareness noted. Tx session targeted improving tolerance for functional activity and safe indep ADL completion. Pt performs bed mobility with supervision, grooming at edge of bed with MIN A, abruptly returns to supine reporting his stomach hurts, declining further intervention. Pt is left as received, all needs met. OT will follow acutely. If pt chooses to discharge home, would recommend [facility] for safe community MRADLs.   Of note, HR 100-115 at rest/with minimal mobility; RN notified    Recommendations for follow up therapy are one component of a multi-disciplinary discharge planning process, led by the attending physician.  Recommendations may be updated based on patient status, additional functional criteria and insurance authorization.    Follow Up Recommendations  Skilled nursing-short term rehab (<3 hours/day)     Assistance Recommended at Discharge Frequent or constant Supervision/Assistance  Patient can return home with the following  A lot of help with bathing/dressing/bathroom;Assistance with cooking/housework;Direct supervision/assist for medications management;Help with stairs or ramp for entrance;A little help with walking and/or transfers   Equipment Recommendations  Wheelchair cushion (measurements OT);Wheelchair (measurements OT)    Recommendations for Other Services      Precautions / Restrictions  Precautions Precautions: Fall Restrictions Weight Bearing Restrictions: No       Mobility Bed Mobility Overal bed mobility: Needs Assistance Bed Mobility: Supine to Sit, Sit to Supine     Supine to sit: Supervision Sit to supine: Supervision        Transfers Overall transfer level: Needs assistance Equipment used: Rolling walker (2 wheels)               General transfer comment: Pt returns to bed, declines STS due to stomach pain     Balance Overall balance assessment: Needs assistance Sitting-balance support: Feet supported Sitting balance-Leahy Scale: Good                                     ADL either performed or assessed with clinical judgement   ADL Overall ADL's : Needs assistance/impaired     Grooming: Wash/dry face;Sitting;Minimal assistance;Brushing hair Grooming Details (indicate cue type and reason): attempted seated on edge of bed, pt returns to bed complaining of stomach pain and brushes hair, completes washing face at bed level                                    Extremity/Trunk Assessment              Vision       Perception     Praxis      Cognition Arousal/Alertness: Lethargic Behavior During Therapy: Flat affect Overall Cognitive Status: No family/caregiver present to determine baseline cognitive functioning Area of Impairment: Orientation, Attention, Memory, Following commands, Safety/judgement, Awareness, Problem solving  Orientation Level: Disoriented to, Place, Time, Situation Current Attention Level: Focused Memory: Decreased short-term memory Following Commands: Follows one step commands with increased time Safety/Judgement: Decreased awareness of safety, Decreased awareness of deficits Awareness: Intellectual Problem Solving: Slow processing, Decreased initiation          Exercises      Shoulder Instructions       General Comments vitals appear stable, tx  session limited by cognition on this date    Pertinent Vitals/ Pain       Pain Assessment Pain Assessment: Faces Faces Pain Scale: Hurts little more Pain Location: RUE, stomach Pain Descriptors / Indicators: Sore, Guarding, Grimacing, Discomfort Pain Intervention(s): Limited activity within patient's tolerance, Monitored during session (RN notified of pt complaints)  Home Living                                          Prior Functioning/Environment              Frequency  Min 2X/week        Progress Toward Goals  OT Goals(current goals can now be found in the care plan section)  Progress towards OT goals: Progressing toward goals     Plan Discharge plan remains appropriate;Frequency remains appropriate    Co-evaluation                 AM-PAC OT "6 Clicks" Daily Activity     Outcome Measure   Help from another person eating meals?: None Help from another person taking care of personal grooming?: A Little Help from another person toileting, which includes using toliet, bedpan, or urinal?: A Little Help from another person bathing (including washing, rinsing, drying)?: A Lot Help from another person to put on and taking off regular upper body clothing?: A Little Help from another person to put on and taking off regular lower body clothing?: A Lot 6 Click Score: 17    End of Session    OT Visit Diagnosis: Other abnormalities of gait and mobility (R26.89);Muscle weakness (generalized) (M62.81);Pain Pain - Right/Left: Right Pain - part of body: Shoulder;Arm;Hand   Activity Tolerance Patient limited by pain   Patient Left in bed;with call bell/phone within reach   Nurse Communication Mobility status (reported pain)        Time: [id number] OT Time Calculation (min): 19 min  Charges: OT General Charges $OT Visit: 1 Visit OT Treatments $Self Care/Home Management : 8-22 mins  Shanon Payor, OTD OTR/L  07/22/22, 3:49 PM

## 2022-07-22 NOTE — TOC Progression Note (Addendum)
Transition of Care [facility]) - Progression Note    Patient Details  Name: [name] MRN: [id number] Date of Birth: 1951-02-20  Transition of Care [facility]) CM/SW Contact  Candie Chroman, LCSW Phone Number: 07/22/2022, 8:36 AM  Clinical Narrative: No bed offers this morning. Expanded search to [facility], and [facility].  11:58 am: Met with patient, significant other, and daughter [name] at bedside. They confirmed preference for patient to return home with home health. Patient was recently active with [facility]. Left message for liaison to see if they can accept him back. If not, will check with other agencies.  1:09 pm: [facility] has accepted referral for PT, OT, and SW.  Expected Discharge Plan: [place] Barriers to Discharge: Continued Medical Work up  Expected Discharge Plan and Services     Post Acute Care Choice: [place] Living arrangements for the past 2 months: Single Family Home                                       Social Determinants of Health (SDOH) Interventions SDOH Screenings   Food Insecurity: No Food Insecurity (07/20/2022)  Housing: Low Risk  (07/20/2022)  Transportation Needs: No Transportation Needs (07/20/2022)  Utilities: Not At Risk (07/20/2022)  Alcohol Screen: Low Risk  (06/21/2022)  Depression (PHQ2-9): Medium Risk (06/18/2022)  Financial Resource Strain: Low Risk  (06/21/2022)  Physical Activity: Inactive (06/21/2022)  Social Connections: Socially Isolated (06/21/2022)  Stress: Stress Concern Present (06/21/2022)  Tobacco Use: High Risk (07/19/2022)    Readmission Risk Interventions    01/29/2020   12:41 PM  Readmission Risk Prevention Plan  Transportation Screening Complete  Palliative Care Screening Not Applicable  Medication Review (RN Care Manager) Complete

## 2022-07-22 NOTE — Care Management Important Message (Signed)
Important Message  Patient Details  Name: [name] MRN: [id number] Date of Birth: [birth year]   Medicare Important Message Given:  Yes  Patient asleep upon time of visit.  Copy of Medicare IM left in room on counter for reference.   [name] 07/22/2022, 2:50 PM

## 2022-07-22 NOTE — Progress Notes (Signed)
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  Patient was found to have hypercalcemia complicated by acute kidney injury, hyponatremia and pain and discomfort.  Initially started on calcitonin which improved his calcium levels.  He was seen by oncology and eventually recommended starting him pamidronate with outpatient follow-up.  PT/OT recommended SNF therefore arrangements being made.  Hospitalist service has also been consulted by oncology team.  PT recommended SNF.  Hospital course complicated by sinus pause and bradycardia therefore cardiology recommending pacemaker.   Assessment & Plan:  Principal Problem:   Hypercalcemia Active Problems:   Right shoulder pain   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HCC)   Hyponatremia   AKI (acute kidney injury) ([place])   History of DVT (deep vein thrombosis)   History of pulmonary embolus (PE)   AMS (altered mental status)   Hypotension     Assessment and Plan: * Hypercalcemia; 13.3 Calcium stable for now with IV fluids and calcitonin but I suspect recurrent issues while undergoing cancer treatment.  Discussed with Dr. Janese Banks was started patient on pamidronate.  Patient will be getting outpatient chemotherapy and radiation starting next week.  PT recommending SNF, will have to search for rehab which will simultaneously allow his cancer treatment and rehab. Seen by palliative care and oncology  Bradycardiac with Pauses History of chronic atrial fibrillation -Sinus pauses noted.  Holding AV nodal blockers.  Monitor and replete electrolytes as needed.  [facility] cardiology consulted, will discuss with family regarding  pacemaker.  Hyponatremia Improved  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HCC) Squamous cell cancer of the lungs Follows with Dr. Janese Banks  Right shoulder pain Appears to be chronic secondary to bony and T1 invasion.  AKI (acute kidney injury) ([place]), resolved Baseline creatinine 0.8.  Creatinine peaked at 1.5  History of pulmonary embolus (PE); 2018 & 2023 Will place him on Lovenox '1mg'$ /kg. Resume Xarelto upon dc.   Hypotension IVF  AMS (altered mental status); Improved.  CT head is negative. Will monitor.   Chronic A fib CHF ef 50% On Xarelto at home.    GERD as of care discussion.  I discussed patient's advance medical conditions and comorbidities with the daughter and significant other at bedside.  They understand and will discuss amongst themselves regarding how aggressively they would like to proceed with his cancer treatment along with pacemaker placement.   DVT prophylaxis: Lovenox '1mg'$ /kg Code Status: Full  Family Communication:  [name] and [name] at bedside  Status is: Inpatient Getting treatment for HyperCa, ongoing evaluation for pacemaker placement    Subjective: Seen and examined at bedside.  No complaints.  Family at bedside Examination:  Constitutional: Not in acute distress Respiratory: Clear to auscultation bilaterally Cardiovascular: Normal sinus rhythm, no rubs Abdomen: Nontender nondistended good bowel sounds Musculoskeletal: No edema noted Skin: No rashes seen Neurologic: CN 2-12 grossly intact.  Chronic right-sided weakness and some expressive aphasia Psychiatric: Normal judgment and insight. Alert and oriented x 3. Normal mood.   Objective: Vitals:   07/21/22 1524 07/21/22 1949 07/22/22 0355 07/22/22 0719  BP: (!) 149/91 (!) 152/84 139/84 (!) 144/89  Pulse: 87 84 72 (!) 57  Resp: '16 20 18 16  '$ Temp: 98.4 F (36.9 C) 98  F (36.7 C) 97.7 F (36.5 C) 98.2 F (36.8 C)  TempSrc:  Oral Oral   SpO2: 100% 100% 100% 100%  Weight:      Height:         Intake/Output Summary (Last 24 hours) at 07/22/2022 0752 Last data filed at 07/22/2022 0300 Gross per 24 hour  Intake 880 ml  Output 1100 ml  Net -220 ml   Filed Weights   07/19/22 1926  Weight: 82.5 kg     Data Reviewed:   CBC: Recent Labs  Lab 07/19/22 1946 07/19/22 2319 07/20/22 0541 07/21/22 0543 07/22/22 0438  WBC 7.9 8.2 8.6 8.2 7.8  HGB 10.2* 9.6* 9.8* 8.9* 9.2*  HCT 34.0* 31.8* 31.9* 28.5* 29.3*  MCV 81.0 79.9* 78.6* 77.7* 76.7*  PLT 219 221 210 186 [id number]   Basic Metabolic Panel: Recent Labs  Lab 07/19/22 1946 07/19/22 2319 07/20/22 0541 07/20/22 1519 07/21/22 0112 07/21/22 0543 07/22/22 0438  NA 130*  --  129*  --  134* 134* 132*  K 5.1  --  4.3  --  4.3 4.3 3.8  CL 94*  --  98  --  101 102 100  CO2 26  --  24  --  '24 23 22  '$ GLUCOSE 119*  --  143*  --  120* 122* 102*  BUN 43*  --  37*  --  29* 26* 19  CREATININE 1.50* 1.37* 1.08  --  0.86 0.82 0.76  CALCIUM 13.3*  --  12.4* 11.9* 11.6* 11.3* 11.2*  MG 2.3  --   --   --  1.9 1.8 1.8  PHOS  --  2.8  --   --   --   --   --    GFR: Estimated Creatinine Clearance: 95.6 mL/min (by C-G formula based on SCr of 0.76 mg/dL). Liver Function Tests: Recent Labs  Lab 07/19/22 1946  AST 31  ALT 22  ALKPHOS 70  BILITOT 0.9  PROT 8.0  ALBUMIN 3.4*   No results for input(s): "LIPASE", "AMYLASE" in the last 168 hours. No results for input(s): "AMMONIA" in the last 168 hours. Coagulation Profile: Recent Labs  Lab 07/19/22 2319  INR 1.2   Cardiac Enzymes: No results for input(s): "CKTOTAL", "CKMB", "CKMBINDEX", "TROPONINI" in the last 168 hours. BNP (last 3 results) No results for input(s): "PROBNP" in the last 8760 hours. HbA1C: No results for input(s): "HGBA1C" in the last 72 hours. CBG: No results for input(s): "GLUCAP" in the last 168 hours. Lipid Profile: No results for input(s): "CHOL", "HDL", "LDLCALC", "TRIG", "CHOLHDL", "LDLDIRECT" in the last 72 hours. Thyroid Function Tests: Recent Labs     07/21/22 0112 07/21/22 0540  TSH 0.219*  --   FREET4  --  1.35*   Anemia Panel: No results for input(s): "VITAMINB12", "FOLATE", "FERRITIN", "TIBC", "IRON", "RETICCTPCT" in the last 72 hours. Sepsis Labs: No results for input(s): "PROCALCITON", "LATICACIDVEN" in the last 168 hours.  Recent Results (from the past 240 hour(s))  MRSA Next Gen by PCR, Nasal     Status: None   Collection Time: 07/20/22 11:47 AM   Specimen: Nasal Mucosa; Nasal Swab  Result Value Ref Range Status   MRSA by PCR Next Gen NOT DETECTED NOT DETECTED Final    Comment: (NOTE) The GeneXpert MRSA Assay (FDA approved for NASAL specimens only), is one component of a comprehensive MRSA colonization surveillance program. It is not intended to diagnose MRSA infection nor to guide or monitor treatment for MRSA infections. Test performance  is not FDA approved in patients less than 64 years old. Performed at [facility], [address], [place], [place] [address]          Radiology Studies: No results found.      Scheduled Meds:  allopurinol  100 mg Oral Daily   docusate sodium  100 mg Oral BID   enoxaparin (LOVENOX) injection  42.5 mg Subcutaneous Once   enoxaparin (LOVENOX) injection  1 mg/kg Subcutaneous Q12H   ferrous sulfate  325 mg Oral Q breakfast   latanoprost  1 drop Both Eyes QHS   senna  1 tablet Oral BID   simvastatin  20 mg Oral QHS   sodium chloride flush  3 mL Intravenous Q12H   Continuous Infusions:     LOS: 3 days   Time spent= 35 mins    Maan [name] Arsenio Loader, MD [facility]  If 7PM-7AM, please contact night-coverage  07/22/2022, 7:52 AM

## 2022-07-22 NOTE — Plan of Care (Signed)

## 2022-07-22 NOTE — Progress Notes (Signed)
Mobility Specialist - Progress Note   07/22/22 1000  Mobility  Activity Refused mobility     Pt politely declined mobility this morning; family at bedside. No complaints. Will attempt mobility another date/time.    Kathee Delton Mobility Specialist 07/22/22, 10:23 AM

## 2022-07-22 NOTE — Progress Notes (Signed)
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 right-side of the T1 vertebra. Tracer avid right hilar lymph node measures 2.1 cm within SUV max of 13.24. Incidental CT findings: Aortic atherosclerosis. Coronary artery calcifications. Mild cardiac enlargement. ABDOMEN/PELVIS: No abnormal hypermetabolic activity within the liver, pancreas, adrenal glands, or spleen. No hypermetabolic lymph nodes in the abdomen or pelvis. Incidental CT findings: Aortic atherosclerosis. Filter identified within the IVC. Scarring, calcification in volume loss noted involving the upper pole of the left kidney. SKELETON: No focal hypermetabolic activity to suggest skeletal metastasis. Incidental CT findings: There is no significant tracer uptake associated with the previously characterized lucent bone lesion involving T11 vertebral body which has been present since 04/07/2019 and may represent a benign hemangioma. IMPRESSION: 1. Right apical lung mass is intensely FDG avid compatible with primary bronchogenic carcinoma. There is evidence for chest wall invasion with erosive changes involving posterior aspect right first and second ribs and right-side of the T1  vertebra and transverse process. The right first and second ribs as well as the T1 transverse process and right-side of the T1 vertebra. MRI of the thoracic spine may be helpful to assess for neural foraminal involvement and spinal canal involvement at this level. 2. FDG avid right  hilar lymph node compatible with nodal metastasis. 3. No signs of FDG avid distant metastatic disease. 4. Aortic Atherosclerosis (ICD10-I70.0). Coronary artery calcifications. Electronically Signed   By: Kerby Moors M.D.   On: 06/26/2022 18:15   CT Head Wo Contrast  Result Date: 06/25/2022 CLINICAL DATA:  Generalized weakness.  History of bilateral CVAs. EXAM: CT HEAD WITHOUT CONTRAST TECHNIQUE: Contiguous axial images were obtained from the base of the skull through the vertex without intravenous contrast. RADIATION DOSE REDUCTION: This exam was performed according to the departmental dose-optimization program which includes automated exposure control, adjustment of the mA and/or kV according to patient size and/or use of iterative reconstruction technique. COMPARISON:  Head CT 06/11/2022. FINDINGS: Brain: Again noted are chronic infarcts in the left frontoparietal area and in the right-greater-than-left occipital lobes, linear lacunar infarct superior right cerebellar hemisphere, and mild atrophy, small-vessel disease and atrophic ventriculomegaly with persistent cavum septi pellucidi and cavum vergae. No midline shift is seen. Benign dural calcifications are scattered along the falx. No new asymmetry is seen concerning for an acute cortical based infarct, hemorrhage, mass or mass effect. Basal cisterns are clear. There is stable mild ex vacuo prominence of the body of the left lateral ventricle and occipital horn of right lateral ventricle. Vascular: Patchy calcification noted both siphons, left distal vertebral artery. No hyperdense central vessels. Skull: Negative for fractures or focal lesions. Sinuses/Orbits: There are old  lens replacements. No acute orbital findings. Mild chronic disease in the ethmoids, both maxillary sinuses. The frontal and sphenoid sinus, bilateral mastoid air cells, and middle ears are clear. Other: None. IMPRESSION: 1. No acute intracranial CT findings or interval changes. 2. 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 3. Sinus disease. Electronically Signed   By: Telford Nab M.D.   On: 06/25/2022 21:40   DG Chest Portable 1 View  Result Date: 06/25/2022 CLINICAL DATA:  Weakness EXAM: PORTABLE CHEST 1 VIEW COMPARISON:  06/11/2022 FINDINGS: Stable cardiomegaly. Right apical mass with destructive changes of the adjacent right first and second ribs, not appreciably changed from the previous exam. Elsewhere, lungs are clear. No pleural effusion or pneumothorax. IMPRESSION: Right apical mass with destructive changes of the adjacent right first and second ribs, not appreciably changed from the previous exam. Electronically Signed   By: Davina Poke D.O.   On: 06/25/2022 21:31    PERFORMANCE STATUS (ECOG) : 3 - Symptomatic, >50% confined to bed  Review of Systems Unless otherwise noted, a complete review of systems is negative.  Physical Exam General: NAD Cardiovascular: Bradycardia Pulmonary: clear ant fields Abdomen: soft, nontender, + bowel sounds GU: no suprapubic tenderness Extremities: no edema, no joint deformities Skin: no rashes Neurological: Weakness but otherwise nonfocal  IMPRESSION: Follow-up visit.  Patient has had cardiac pauses with subsequent evaluation by cardiology and plan for pacemaker on Monday by Dr. Saralyn Pilar.  Unclear if this will delay initiation of chemotherapy and radiation, both of which are also scheduled for Monday morning.  Discussed goals with family (significant other and daughter).  Patient and family remain committed to pursuing full scope/full code.  Patient previously started on transdermal fentanyl but this was held/discontinued due to concern  for confusion.  At present, patient's mentation appears to be at baseline in light of his history of previous strokes.  He has expressive aphasia and therefore cannot always communicate well with staff.  Currently, patient endorses pain.  His family verbalized concern that his pain is not adequately controlled.  Will liberalize dosing of oxycodone and discussed  with nursing and encouraged regular assessment of his pain.  We can reevaluate need for restarting transdermal fentanyl if needed.  PLAN: -Continue current scope of treatment -Full code -Patient pending pacemaker -Plan is to start chemoradiation next week -Liberalize oxycodone dosing and frequency with regular pain assessments -Will consider restarting transdermal fentanyl if needed -Daily bowel regimen -Will follow  Case and plan discussed with Dr. Reesa Chew and Dr. Darrall Dears  Time Total: 25 minutes  Visit consisted of counseling and education dealing with the complex and emotionally intense issues of symptom management and palliative care in the setting of serious and potentially life-threatening illness.Greater than 50%  of this time was spent counseling and coordinating care related to the above assessment and plan.  Signed by: Altha Harm, PhD, NP-C

## 2022-07-22 NOTE — Progress Notes (Signed)
Physical Therapy Treatment Patient Details Name: [name] MRN: [id number] DOB: [birth year] Today's Date: 07/22/2022   History of Present Illness [name] is a 62yoM who comes to [facility] on 07/19/22 after concerns of hypercalcemia on recent labs. 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    PT Comments    Pt able to ambulate in room with RW and CGA. He is very lethargic and becomes DOE, yet SpO2 remains in upper 90's. Pt is currently declining SNF placement once medically cleared for d/c. He may benefit from a w/c and cushion for future out-pt appointments. Daughter is a [employer] manager and states she can provide help at home in single story apartment. Will continue to progress as tolerated. Pt was ambulating 193f with RW on last admission less than a month ago.    Recommendations for follow up therapy are one component of a multi-disciplinary discharge planning process, led by the attending physician.  Recommendations may be updated based on patient status, additional functional criteria and insurance authorization.  Follow Up Recommendations  Skilled nursing-short term rehab (<3 hours/day) Can patient physically be transported by private vehicle: Yes   Assistance Recommended at Discharge Intermittent Supervision/Assistance  Patient can return home with the following Assistance with cooking/housework;Direct supervision/assist for medications management;Assist for transportation;Help with stairs or ramp for entrance;A lot of help with walking and/or transfers;A lot of help with bathing/dressing/bathroom;Assistance with feeding   Equipment Recommendations  Other (comment) (Pt declines SNF, if he d/c's home he will benefit from a w/c and cushion)    Recommendations for Other Services       Precautions / Restrictions Precautions Precautions: Fall Restrictions Weight Bearing Restrictions: No      Mobility  Bed Mobility Overal bed mobility: Needs Assistance Bed Mobility: Supine to Sit, Sit to Supine     Supine to sit: Supervision Sit to supine: Supervision        Transfers Overall transfer level: Needs assistance Equipment used: Rolling walker (2 wheels) Transfers: Sit to/from Stand Sit to Stand: From elevated surface, Mod assist           General transfer comment: MIN A to side step toward HOB.    Ambulation/Gait Ambulation/Gait assistance: Min guard Gait Distance (Feet):  (27fwith nursing) Assistive device: Rolling walker (2 wheels)   Gait velocity: decreased     General Gait Details:  (easily fatigued)   Stairs             Wheelchair Mobility    Modified Rankin (Stroke Patients Only)       Balance                                            Cognition Arousal/Alertness: Lethargic Behavior During Therapy: Flat affect Overall Cognitive Status: Within Functional Limits for tasks assessed                         Following Commands: Follows one step commands with increased time       General Comments: Pt with difficulty communicating due to affects of past CVA, oriented to self, place, and limited situation.        Exercises General Exercises - Lower Extremity Ankle Circles/Pumps: AROM, Both, 5 reps Heel Slides: AROM, Both, 5 reps    General  Comments General comments (skin integrity, edema, etc.):  (Long discussion with pt and significant other regarding current LOF and safe d/c home instead of SNF. Education provided and all concerns addressed)      Pertinent Vitals/Pain Pain Assessment Pain Assessment: 0-10 Pain Score: 4  Pain Location: RUE Pain Descriptors / Indicators: Sore, Guarding, Grimacing Pain Intervention(s): Limited activity within patient's tolerance    Home Living                          Prior Function            PT Goals (current goals can now be found in the  care plan section) Acute Rehab PT Goals Patient Stated Goal: less pain    Frequency    Min 2X/week      PT Plan      Co-evaluation              AM-PAC PT "6 Clicks" Mobility   Outcome Measure  Help needed turning from your back to your side while in a flat bed without using bedrails?: None Help needed moving from lying on your back to sitting on the side of a flat bed without using bedrails?: None Help needed moving to and from a bed to a chair (including a wheelchair)?: A Little Help needed standing up from a chair using your arms (e.g., wheelchair or bedside chair)?: A Little Help needed to walk in hospital room?: A Little Help needed climbing 3-5 steps with a railing? : A Lot 6 Click Score: 19    End of Session         PT Visit Diagnosis: Unsteadiness on feet (R26.81);Other abnormalities of gait and mobility (R26.89);Difficulty in walking, not elsewhere classified (R26.2);Dizziness and giddiness (R42);Pain Pain - Right/Left: Right Pain - part of body: Arm     Time: [id number] PT Time Calculation (min) (ACUTE ONLY): 12 min  Charges:  $Therapeutic Activity: 8-22 mins                    Mikel Cella, PTA  [name] 07/22/2022, 1:21 PM

## 2022-07-23 ENCOUNTER — Other Ambulatory Visit: Payer: Self-pay | Admitting: Internal Medicine

## 2022-07-23 ENCOUNTER — Inpatient Hospital Stay: Payer: 59

## 2022-07-23 ENCOUNTER — Ambulatory Visit
Admission: RE | Admit: 2022-07-23 | Discharge: 2022-07-23 | Disposition: A | Discharge status: Home / Self Care | Payer: 59 | Source: Ambulatory Visit | Attending: Oncology | Admitting: Oncology

## 2022-07-23 LAB — CBC
HCT: 30.9 % — ABNORMAL LOW (ref 39.0–52.0)
Hemoglobin: 9.6 g/dL — ABNORMAL LOW (ref 13.0–17.0)
MCH: 23.9 pg — ABNORMAL LOW (ref 26.0–34.0)
MCHC: 31.1 g/dL (ref 30.0–36.0)
MCV: 77.1 fL — ABNORMAL LOW (ref 80.0–100.0)
Platelets: 210 10*3/uL (ref 150–400)
RBC: 4.01 MIL/uL — ABNORMAL LOW (ref 4.22–5.81)
RDW: 15.9 % — ABNORMAL HIGH (ref 11.5–15.5)
WBC: 8.3 10*3/uL (ref 4.0–10.5)
nRBC: 0 % (ref 0.0–0.2)

## 2022-07-23 LAB — SURGICAL PCR SCREEN
MRSA, PCR: NEGATIVE
Staphylococcus aureus: NEGATIVE

## 2022-07-23 LAB — BASIC METABOLIC PANEL
Anion gap: 9 (ref 5–15)
BUN: 17 mg/dL (ref 8–23)
CO2: 24 mmol/L (ref 22–32)
Calcium: 10.7 mg/dL — ABNORMAL HIGH (ref 8.9–10.3)
Chloride: 98 mmol/L (ref 98–111)
Creatinine, Ser: 0.8 mg/dL (ref 0.61–1.24)
GFR, Estimated: >60 mL/min (ref >60)
Glucose, Bld: 109 mg/dL — ABNORMAL HIGH (ref 70–99)
Potassium: 3.3 mmol/L — ABNORMAL LOW (ref 3.5–5.1)
Sodium: 131 mmol/L — ABNORMAL LOW (ref 135–145)

## 2022-07-23 LAB — MAGNESIUM: Magnesium: 1.8 mg/dL (ref 1.7–2.4)

## 2022-07-23 MED ORDER — SODIUM CHLORIDE 0.9 % IV SOLN: INTRAVENOUS | Status: DC

## 2022-07-23 MED ORDER — POTASSIUM CHLORIDE 10 MEQ/100ML IV SOLN
10.0000 meq | INTRAVENOUS | Status: AC
  Administered 2022-07-23 (×5): 10 meq via INTRAVENOUS
  Filled 2022-07-23 (×5): qty 100

## 2022-07-23 MED ORDER — MAGNESIUM SULFATE IN D5W 1-5 GM/100ML-% IV SOLN
1.0000 g | Freq: Once | INTRAVENOUS | Status: AC
  Administered 2022-07-23: 1 g via INTRAVENOUS
  Filled 2022-07-23: qty 100

## 2022-07-23 MED FILL — Dexamethasone Sodium Phosphate Inj 100 MG/10ML: INTRAMUSCULAR | Qty: 1 | Status: AC

## 2022-07-23 NOTE — Progress Notes (Signed)
PROGRESS NOTE    [name]  [id number] DOB: May 08, 1951 DOA: 07/19/2022 PCP: [facility] [place]   Brief Narrative:  72 year old with history of 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  Patient was found to have hypercalcemia complicated by acute kidney injury, hyponatremia and pain and discomfort.  Initially started on calcitonin which improved his calcium levels.  He was seen by oncology and eventually recommended starting him pamidronate with outpatient follow-up.  PT/OT recommended SNF therefore arrangements being made.  Hospitalist service has also been consulted by oncology team.  PT recommended SNF.  Hospital course complicated by sinus pause and bradycardia therefore cardiology recommending pacemaker.  Tentative plans for pacemaker placement on Monday.   Assessment & Plan:  Principal Problem:   Hypercalcemia Active Problems:   Right shoulder pain   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HCC)   Hyponatremia   AKI (acute kidney injury) ([place])   History of DVT (deep vein thrombosis)   History of pulmonary embolus (PE)   AMS (altered mental status)   Hypotension     Assessment and Plan: * Hypercalcemia; 13.3 Calcium is stable for now with IV fluids and calcitonin but I suspect recurrent issues while undergoing cancer treatment.  Discussed with Dr. Janese Banks was started patient on pamidronate.  Patient will be getting outpatient chemotherapy and radiation starting next week.  PT recommending SNF, will have to search for rehab which will simultaneously allow his cancer treatment and rehab. Seen by palliative care and oncology  Bradycardiac with Pauses History of chronic atrial fibrillation -Sinus pauses noted.  Holding AV nodal blockers.  [facility] cardiology following, tentative plans for pacemaker placement on  Monday.  Hyponatremia Stable  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HCC) Squamous cell cancer of the lungs Follows with Dr. Janese Banks.  Postpone chemo port placement plans until cardiac pacemaker has been inserted. Discussed with Dr. Doyne Keel who will set up for outpatient chemo next week  Right shoulder pain Appears to be chronic secondary to bony and T1 invasion.  AKI (acute kidney injury) ([place]), resolved Baseline creatinine 0.8.  Creatinine peaked at 1.5  History of pulmonary embolus (PE); 2018 & 2023 Lovenox '1mg'$ /kg. Resume Xarelto upon dc.   Hypotension IVF  AMS (altered mental status); Improved.  CT head is negative. Will monitor.   Chronic A fib CHF ef 50% On Xarelto at home.    After prolonged discussion with the family members, they would like to proceed with full scope of treatment.  Palliative care team is also following along.  DVT prophylaxis: Lovenox '1mg'$ /kg Code Status: Full  Family Communication: None at bedside today  Status is: Inpatient Getting treatment for HyperCa, ongoing evaluation for pacemaker placement    Subjective: Seen and examined at bedside.  Patient does not have any complaints.  Examination: Constitutional: Not in acute distress Respiratory: Clear to auscultation bilaterally Cardiovascular: Normal sinus rhythm, no rubs Abdomen: Nontender nondistended good bowel sounds Musculoskeletal: No edema noted Skin: No rashes seen Neurologic: Chronic right-sided weakness and expressive aphasia Psychiatric: Normal judgment and insight. Alert and oriented x 3. Normal mood.     Objective: Vitals:   07/22/22 2006 07/22/22 2015 07/23/22 0430 07/23/22 0743  BP: (!) 144/83  (!) 143/96 (!) 139/97  Pulse: 68 88 (!) 53 (!) 109  Resp: '20  18 18  '$ Temp: 98 F (36.7 C)  98 F (36.7  C) 97.7 F (36.5 C)  TempSrc: Oral  Oral   SpO2: (!) 80% 100% 97% 100%  Weight:      Height:        Intake/Output Summary (Last 24 hours) at 07/23/2022 0753 Last data filed at  07/23/2022 0533 Gross per 24 hour  Intake 660 ml  Output 500 ml  Net 160 ml   Filed Weights   07/19/22 1926  Weight: 82.5 kg     Data Reviewed:   CBC: Recent Labs  Lab 07/19/22 2319 07/20/22 0541 07/21/22 0543 07/22/22 0438 07/23/22 0415  WBC 8.2 8.6 8.2 7.8 8.3  HGB 9.6* 9.8* 8.9* 9.2* 9.6*  HCT 31.8* 31.9* 28.5* 29.3* 30.9*  MCV 79.9* 78.6* 77.7* 76.7* 77.1*  PLT 221 210 186 201 [id number]   Basic Metabolic Panel: Recent Labs  Lab 07/19/22 1946 07/19/22 2319 07/20/22 0541 07/20/22 1519 07/21/22 0112 07/21/22 0543 07/22/22 0438 07/23/22 0415  NA 130*  --  129*  --  134* 134* 132* 131*  K 5.1  --  4.3  --  4.3 4.3 3.8 3.3*  CL 94*  --  98  --  101 102 100 98  CO2 26  --  24  --  '24 23 22 24  '$ GLUCOSE 119*  --  143*  --  120* 122* 102* 109*  BUN 43*  --  37*  --  29* 26* 19 17  CREATININE 1.50* 1.37* 1.08  --  0.86 0.82 0.76 0.80  CALCIUM 13.3*  --  12.4* 11.9* 11.6* 11.3* 11.2* 10.7*  MG 2.3  --   --   --  1.9 1.8 1.8 1.8  PHOS  --  2.8  --   --   --   --   --   --    GFR: Estimated Creatinine Clearance: 95.6 mL/min (by C-G formula based on SCr of 0.8 mg/dL). Liver Function Tests: Recent Labs  Lab 07/19/22 1946  AST 31  ALT 22  ALKPHOS 70  BILITOT 0.9  PROT 8.0  ALBUMIN 3.4*   No results for input(s): "LIPASE", "AMYLASE" in the last 168 hours. No results for input(s): "AMMONIA" in the last 168 hours. Coagulation Profile: Recent Labs  Lab 07/19/22 2319  INR 1.2   Cardiac Enzymes: No results for input(s): "CKTOTAL", "CKMB", "CKMBINDEX", "TROPONINI" in the last 168 hours. BNP (last 3 results) No results for input(s): "PROBNP" in the last 8760 hours. HbA1C: No results for input(s): "HGBA1C" in the last 72 hours. CBG: No results for input(s): "GLUCAP" in the last 168 hours. Lipid Profile: No results for input(s): "CHOL", "HDL", "LDLCALC", "TRIG", "CHOLHDL", "LDLDIRECT" in the last 72 hours. Thyroid Function Tests: Recent Labs    07/21/22 0112  07/21/22 0540  TSH 0.219*  --   FREET4  --  1.35*   Anemia Panel: No results for input(s): "VITAMINB12", "FOLATE", "FERRITIN", "TIBC", "IRON", "RETICCTPCT" in the last 72 hours. Sepsis Labs: No results for input(s): "PROCALCITON", "LATICACIDVEN" in the last 168 hours.  Recent Results (from the past 240 hour(s))  MRSA Next Gen by PCR, Nasal     Status: None   Collection Time: 07/20/22 11:47 AM   Specimen: Nasal Mucosa; Nasal Swab  Result Value Ref Range Status   MRSA by PCR Next Gen NOT DETECTED NOT DETECTED Final    Comment: (NOTE) The GeneXpert MRSA Assay (FDA approved for NASAL specimens only), is one component of a comprehensive MRSA colonization surveillance program. It is not intended to diagnose MRSA infection nor to  guide or monitor treatment for MRSA infections. Test performance is not FDA approved in patients less than 86 years old. Performed at [facility], [address], [place], [place] [address]          Radiology Studies: No results found.      Scheduled Meds:  allopurinol  100 mg Oral Daily   docusate sodium  100 mg Oral BID   enoxaparin (LOVENOX) injection  1 mg/kg Subcutaneous Q12H   ferrous sulfate  325 mg Oral Q breakfast   latanoprost  1 drop Both Eyes QHS   senna  1 tablet Oral BID   simvastatin  20 mg Oral QHS   sodium chloride flush  3 mL Intravenous Q12H   Continuous Infusions:  magnesium sulfate bolus IVPB     potassium chloride        LOS: 4 days   Time spent= 35 mins    Keiondre Colee Arsenio Loader, MD [facility]  If 7PM-7AM, please contact night-coverage  07/23/2022, 7:53 AM

## 2022-07-23 NOTE — Progress Notes (Signed)
Per Cardiac monitor tech, patient had 5bts VT. Potasium 3.3.Dr A. Richland notified.

## 2022-07-23 NOTE — Progress Notes (Signed)
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 identified, although postcontrast imaging is moderately motion degraded which reduces the sensitivity for detection of very small lesions. Vascular: Small distal right vertebral artery, likely with chronically diminished flow or possibly chronic occlusion. Skull and upper cervical spine: No suspicious marrow lesion. Sinuses/Orbits: Bilateral cataract extraction. Minimal mucosal thickening in the ethmoid sinuses. Clear mastoid air cells. Other: None. IMPRESSION: 1. No evidence of intracranial metastases. 2. Multiple chronic infarcts as above. Electronically Signed   By: ALogan BoresM.D.   On: 06/28/2022 14:00   NM PET Image Initial (PI) Skull Base To Thigh  Result Date: 06/26/2022 CLINICAL DATA:  Initial treatment strategy for lung mass. EXAM: NUCLEAR MEDICINE PET SKULL BASE TO THIGH TECHNIQUE: 9.55 mCi F-18 FDG was injected intravenously. Full-ring PET imaging was performed from the skull base to thigh after the radiotracer. CT data was obtained and used for attenuation correction and anatomic  localization. Fasting blood glucose: 107 mg/dl COMPARISON:  CT chest 06/11/2022 FINDINGS: Mediastinal blood pool activity: SUV max 2.11 Liver activity: SUV max NA NECK: No tracer avid lymph nodes within the neck. Incidental CT findings: None. CHEST: Right apical lung mass is again noted. This measures 8.7 x 6.0 by  6.45 and has an SUV max of 23.36, image 66/2. As mentioned previously chest wall invasion is identified with a roast of changes involving the posterior aspect of the right first and second ribs as well as the T1 transverse process and right-side of the T1 vertebra. Tracer avid right hilar lymph node measures 2.1 cm within SUV max of 13.24. Incidental CT findings: Aortic atherosclerosis. Coronary artery calcifications. Mild cardiac enlargement. ABDOMEN/PELVIS: No abnormal hypermetabolic activity within the liver, pancreas, adrenal glands, or spleen. No hypermetabolic lymph nodes in the abdomen or pelvis. Incidental CT findings: Aortic atherosclerosis. Filter identified within the IVC. Scarring, calcification in volume loss noted involving the upper pole of the left kidney. SKELETON: No focal hypermetabolic activity to suggest skeletal metastasis. Incidental CT findings: There is no significant tracer uptake associated with the previously characterized lucent bone lesion involving T11 vertebral body which has been present since 04/07/2019 and may represent a benign hemangioma. IMPRESSION: 1. Right apical lung mass is intensely FDG avid compatible with primary bronchogenic carcinoma. There is evidence for chest wall invasion with erosive changes involving posterior aspect right first and second ribs and right-side of the T1 vertebra and transverse process. The right first and second ribs as well as the T1 transverse process and right-side of the T1 vertebra. MRI of the thoracic spine may be helpful to assess for neural foraminal involvement and spinal canal involvement at this level. 2. FDG avid right hilar  lymph node compatible with nodal metastasis. 3. No signs of FDG avid distant metastatic disease. 4. Aortic Atherosclerosis (ICD10-I70.0). Coronary artery calcifications. Electronically Signed   By: Kerby Moors M.D.   On: 06/26/2022 18:15   CT Head Wo Contrast  Result Date: 06/25/2022 CLINICAL DATA:  Generalized weakness.  History of bilateral CVAs. EXAM: CT HEAD WITHOUT CONTRAST TECHNIQUE: Contiguous axial images were obtained from the base of the skull through the vertex without intravenous contrast. RADIATION DOSE REDUCTION: This exam was performed according to the departmental dose-optimization program which includes automated exposure control, adjustment of the mA and/or kV according to patient size and/or use of iterative reconstruction technique. COMPARISON:  Head CT 06/11/2022. FINDINGS: Brain: Again noted are chronic infarcts in the left frontoparietal area and in the right-greater-than-left occipital lobes, linear lacunar infarct superior right cerebellar hemisphere, and mild atrophy, small-vessel disease and atrophic ventriculomegaly with persistent cavum septi pellucidi and cavum vergae. No midline shift is seen. Benign dural calcifications are scattered along the falx. No new asymmetry is seen concerning for an acute cortical based infarct, hemorrhage, mass or mass effect. Basal cisterns are clear. There is stable mild ex vacuo prominence of the body of the left lateral ventricle and occipital horn of right lateral ventricle. Vascular: Patchy calcification noted both siphons, left distal vertebral artery. No hyperdense central vessels. Skull: Negative for fractures or focal lesions. Sinuses/Orbits: There are old lens replacements. No acute orbital findings. Mild chronic disease in the ethmoids, both maxillary sinuses. The frontal and sphenoid sinus, bilateral mastoid air cells, and middle ears are clear. Other: None. IMPRESSION: 1. No acute intracranial CT findings or interval changes. 2. Chronic  bilateral PCA and left MCA infarcts. 3. Sinus disease. Electronically Signed   By: Telford Nab M.D.   On: 06/25/2022 21:40   DG Chest Portable 1 View  Result Date: 06/25/2022 CLINICAL DATA:  Weakness EXAM: PORTABLE CHEST 1 VIEW COMPARISON:  06/11/2022 FINDINGS: Stable cardiomegaly. Right apical mass with destructive changes of the adjacent right first and second ribs, not appreciably changed from the  previous exam. Elsewhere, lungs are clear. No pleural effusion or pneumothorax. IMPRESSION: Right apical mass with destructive changes of the adjacent right first and second ribs, not appreciably changed from the previous exam. Electronically Signed   By: Davina Poke D.O.   On: 06/25/2022 21:31    ECHO 09/2021  1. Left ventricular ejection fraction, by estimation, is 45 to 50%. The  left ventricle has mildly decreased function. The left ventricle  demonstrates global hypokinesis. There is mild left ventricular  hypertrophy. Left ventricular diastolic parameters  are indeterminate.   2. Right ventricular systolic function is mildly reduced. The right  ventricular size is mildly enlarged. Tricuspid regurgitation signal is  inadequate for assessing PA pressure.   3. Left atrial size was severely dilated.   4. Right atrial size was severely dilated.   5. The mitral valve is abnormal. Mild mitral valve regurgitation.   6. The aortic valve is tricuspid. There is mild calcification of the  aortic valve. There is mild thickening of the aortic valve. Aortic valve  regurgitation is trivial.   7. The inferior vena cava is dilated in size with >50% respiratory  variability, suggesting right atrial pressure of 8 mmHg.   TELEMETRY reviewed by me (LT) 07/23/2022 : AF rate predominantly 70s-80s, 1 instance of a 2.3-second pause noted on telemetry overnight without further long pauses.  5 beats NSVT with occasional PVCs  EKG reviewed by me: AF  79 BPM  Data reviewed by me (LT) 07/23/2022: hospitalist  progress note, oncology progress note, last 24h vitals tele labs imaging I/O    Principal Problem:   Hypercalcemia Active Problems:   AKI (acute kidney injury) ([place])   Right shoulder pain   History of DVT (deep vein thrombosis)   History of pulmonary embolus (PE)   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HCC)   Hyponatremia   AMS (altered mental status)   Hypotension    ASSESSMENT AND PLAN:  [name] is a 37yoM with a PMH of chronic atrial fibrillation on Eliquis, HFrEF (EF 40-45%), hx CVA with residual expressive aphasia, hx DVT/PE, HTN, HLD, DM2, 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  He has been treated w IV fluids and calcitonin with improvement. Overnight from 3/5 - 3/6 nursing staff notified cross coverage of multiple sinus pauses on telemetry, the longest 7.4 seconds while patient was sleeping.  Cardiology is consulted for further assistance.  # Hypercalcemia # RUL SCC with bone metastasis, pending chemo and radiation # hx CVA with residual expressive aphasia Presented from [facility] with serum calcium at 14, improvement after IV fluids and calcitonin, oncology following, planning to start chemo and radiation within the next week  # Sinus pauses # Chronic atrial fibrillation Noted on telemetry by nursing staff with multiple 2-4-second sinus pauses, the longest pause of concern at 7.3 seconds while the patient was sleeping.  Has not received metoprolol since admission, electrolytes are normalizing with IV fluids, K and mag within normal limits.  Reported history of asymptomatic pauses overnight in 2021 previously attributed to untreated sleep apnea. -No indication for temporary transvenous pacemaker at this time -Monitor and correct electrolytes for a K>4 and mag >2  -Hold BB, CCB -Trial of CPAP overnight/nocturnal [place] -Continuous monitoring on  telemetry -lovenox for stroke prophylaxis for now, last dose Sunday 3/10 in the morning -Long discussion on 3/7 with the patient, his significant  other, and the patient's daughter at bedside re: indication for permanent pacemaker.  Ultimately, the patient and family are agreeable to proceeding with permanent pacemaker as well as pursuing chemo and radiation at this time.  Will plan for Micra leadless pacemaker implantation on Monday, March 11 at 7:30 AM with Dr. Isaias Cowman.  N.p.o. after midnight Sunday, March 10.  -do not recommend proceeding with IR port placement prior to pacemaker placement.  # Chronic HFrEF Euvolemic on exam. Hold home losartan as BP has been normal. Discontinue BB with sinus pauses overnight.   This patient's plan of care was discussed and created with Dr. Clayborn Bigness and he is in agreement.  Signed: Tristan Schroeder , PA-C 07/23/2022, 11:03 AM [facility]

## 2022-07-23 NOTE — Progress Notes (Signed)
Physical Therapy Treatment Patient Details Name: Gibson Verma MRN: [id number] DOB: 02/01/1951 Today's Date: 07/23/2022   History of Present Illness Pius Bulson is a 53yoM who comes to [facility] on 07/19/22 after concerns of hypercalcemia on recent labs. 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    PT Comments    Pt only able to tolerate sitting EOB for a few minutes due to c/o not feeling well and dizziness. Pt required return to supine with symptoms resolving after a few minutes. Pt will need to progress functionally in order to safely return home with assist. Continued recommendation for SNF placement if daughter/spouse can not provide for pt's needs. Awaiting pacemaker placement Monday.   Recommendations for follow up therapy are one component of a multi-disciplinary discharge planning process, led by the attending physician.  Recommendations may be updated based on patient status, additional functional criteria and insurance authorization.  Follow Up Recommendations  Skilled nursing-short term rehab (<3 hours/day) Can patient physically be transported by private vehicle: Yes   Assistance Recommended at Discharge Intermittent Supervision/Assistance  Patient can return home with the following Assistance with cooking/housework;Direct supervision/assist for medications management;Assist for transportation;Help with stairs or ramp for entrance;A lot of help with walking and/or transfers;A lot of help with bathing/dressing/bathroom;Assistance with feeding   Equipment Recommendations  Other (comment) (TBD at next facility, if pt declines SNF, may benefit from a w/c and cushion)    Recommendations for Other Services       Precautions / Restrictions Precautions Precautions: Fall Restrictions Weight Bearing Restrictions: No     Mobility  Bed Mobility Overal bed mobility: Needs  Assistance Bed Mobility: Supine to Sit, Sit to Supine     Supine to sit: Min assist, HOB elevated Sit to supine: Supervision   General bed mobility comments: dizzy sitting EOB    Transfers                   General transfer comment: Pt returns to bed, declines STS due to stomach pain and c/o dizziness    Ambulation/Gait               General Gait Details: unable to stand due to abd discomfort and c/o dizziness   Stairs             Wheelchair Mobility    Modified Rankin (Stroke Patients Only)       Balance Overall balance assessment: Needs assistance Sitting-balance support: Feet supported Sitting balance-Leahy Scale: Good Sitting balance - Comments: steady static sitting, reaching within BOS.                                    Cognition Arousal/Alertness: Lethargic Behavior During Therapy: Flat affect Overall Cognitive Status: Within Functional Limits for tasks assessed Area of Impairment: Orientation, Attention, Memory, Following commands, Safety/judgement, Awareness, Problem solving                       Following Commands: Follows one step commands with increased time Safety/Judgement: Decreased awareness of safety, Decreased awareness of deficits     General Comments: Pt with difficulty communicating due to affects of past CVA, oriented to self, place, and limited situation.        Exercises General Exercises - Lower Extremity Ankle Circles/Pumps: AROM, Both, 10 reps Heel Slides: AROM, Both, 5  reps    General Comments General comments (skin integrity, edema, etc.):  (Pt dysarthric, but able to understand with repeated statements)      Pertinent Vitals/Pain Pain Assessment Pain Assessment: 0-10 Pain Score: 4  Pain Location: RUE, stomach    Home Living                          Prior Function            PT Goals (current goals can now be found in the care plan section) Acute Rehab PT  Goals Patient Stated Goal: less pain    Frequency    Min 2X/week      PT Plan      Co-evaluation              AM-PAC PT "6 Clicks" Mobility   Outcome Measure  Help needed turning from your back to your side while in a flat bed without using bedrails?: A Little Help needed moving from lying on your back to sitting on the side of a flat bed without using bedrails?: A Little Help needed moving to and from a bed to a chair (including a wheelchair)?: A Little Help needed standing up from a chair using your arms (e.g., wheelchair or bedside chair)?: A Lot Help needed to walk in hospital room?: A Lot Help needed climbing 3-5 steps with a railing? : A Lot 6 Click Score: 15    End of Session Equipment Utilized During Treatment: Gait belt Activity Tolerance: Patient limited by pain;Other (comment) (and dizziness sitting EOB) Patient left: in bed;with call bell/phone within reach;with bed alarm set;with SCD's reapplied Nurse Communication: Mobility status PT Visit Diagnosis: Unsteadiness on feet (R26.81);Other abnormalities of gait and mobility (R26.89);Difficulty in walking, not elsewhere classified (R26.2);Dizziness and giddiness (R42);Pain Pain - Right/Left: Right Pain - part of body: Arm     Time: 1210-1230 PT Time Calculation (min) (ACUTE ONLY): 20 min  Charges:  $Therapeutic Activity: 8-22 mins                    Mikel Cella, PTA    [name] 07/23/2022, 1:25 PM

## 2022-07-24 LAB — BASIC METABOLIC PANEL
Anion gap: 8 (ref 5–15)
BUN: 14 mg/dL (ref 8–23)
CO2: 23 mmol/L (ref 22–32)
Calcium: 9.6 mg/dL (ref 8.9–10.3)
Chloride: 99 mmol/L (ref 98–111)
Creatinine, Ser: 0.83 mg/dL (ref 0.61–1.24)
GFR, Estimated: >60 mL/min (ref >60)
Glucose, Bld: 103 mg/dL — ABNORMAL HIGH (ref 70–99)
Potassium: 3.8 mmol/L (ref 3.5–5.1)
Sodium: 130 mmol/L — ABNORMAL LOW (ref 135–145)

## 2022-07-24 LAB — CBC
HCT: 29.2 % — ABNORMAL LOW (ref 39.0–52.0)
Hemoglobin: 9.1 g/dL — ABNORMAL LOW (ref 13.0–17.0)
MCH: 24.1 pg — ABNORMAL LOW (ref 26.0–34.0)
MCHC: 31.2 g/dL (ref 30.0–36.0)
MCV: 77.2 fL — ABNORMAL LOW (ref 80.0–100.0)
Platelets: 166 10*3/uL (ref 150–400)
RBC: 3.78 MIL/uL — ABNORMAL LOW (ref 4.22–5.81)
RDW: 16.2 % — ABNORMAL HIGH (ref 11.5–15.5)
WBC: 7.6 10*3/uL (ref 4.0–10.5)
nRBC: 0 % (ref 0.0–0.2)

## 2022-07-24 LAB — MAGNESIUM: Magnesium: 1.9 mg/dL (ref 1.7–2.4)

## 2022-07-24 NOTE — Progress Notes (Signed)
[facility]    SUBJECTIVE: Resting comfortably has expressive aphasia difficult to communicate does not appear to be agitated no clear evidence of confusion feels reasonably comfortable   Vitals:   07/23/22 1926 07/23/22 2042 07/24/22 0417 07/24/22 0830  BP: 137/80  125/81 114/71  Pulse: 94  94 87  Resp: '16 16 20 20  '$ Temp: 98.2 F (36.8 C)  97.8 F (36.6 C) 97.6 F (36.4 C)  TempSrc: Oral   Oral  SpO2: 99%  100% 100%  Weight:      Height:         Intake/Output Summary (Last 24 hours) at 07/24/2022 0934 Last data filed at 07/24/2022 0558 Gross per 24 hour  Intake 1080 ml  Output 800 ml  Net 280 ml      PHYSICAL EXAM  General: Well developed, well nourished, in no acute distress HEENT:  Normocephalic and atramatic Neck:  No JVD.  Lungs: Clear bilaterally to auscultation and percussion. Heart: Irregularly irregular. Normal S1 and S2 without gallops or murmurs.  Abdomen: Bowel sounds are positive, abdomen soft and non-tender  Msk:  Back normal, normal gait. Normal strength and tone for age. Extremities: No clubbing, cyanosis or edema.   Neuro: Alert and oriented X 3.  Expressive aphasia Psych:  Good affect, responds appropriately   LABS: Basic Metabolic Panel: Recent Labs    07/23/22 0415 07/24/22 0549  NA 131* 130*  K 3.3* 3.8  CL 98 99  CO2 24 23  GLUCOSE 109* 103*  BUN 17 14  CREATININE 0.80 0.83  CALCIUM 10.7* 9.6  MG 1.8 1.9   Liver Function Tests: No results for input(s): "AST", "ALT", "ALKPHOS", "BILITOT", "PROT", "ALBUMIN" in the last 72 hours. No results for input(s): "LIPASE", "AMYLASE" in the last 72 hours. CBC: Recent Labs    07/23/22 0415 07/24/22 0549  WBC 8.3 7.6  HGB 9.6* 9.1*  HCT 30.9* 29.2*  MCV 77.1* 77.2*  PLT 210 166   Cardiac Enzymes: No results for input(s): "CKTOTAL", "CKMB", "CKMBINDEX", "TROPONINI" in the last 72 hours. BNP: Invalid input(s): "POCBNP" D-Dimer: No results for input(s): "DDIMER" in the last 72  hours. Hemoglobin A1C: No results for input(s): "HGBA1C" in the last 72 hours. Fasting Lipid Panel: No results for input(s): "CHOL", "HDL", "LDLCALC", "TRIG", "CHOLHDL", "LDLDIRECT" in the last 72 hours. Thyroid Function Tests: No results for input(s): "TSH", "T4TOTAL", "T3FREE", "THYROIDAB" in the last 72 hours.  Invalid input(s): "FREET3" Anemia Panel: No results for input(s): "VITAMINB12", "FOLATE", "FERRITIN", "TIBC", "IRON", "RETICCTPCT" in the last 72 hours.  No results found.   Echo mild reduced left ventricular function 40 to 45%  TELEMETRY: Atrial fibrillation rate of 75 nonspecific ST-T changes no recent pauses noted:  ASSESSMENT AND PLAN:  Principal Problem:   Hypercalcemia Active Problems:   AKI (acute kidney injury) ([place])   Right shoulder pain   History of DVT (deep vein thrombosis)   History of pulmonary embolus (PE)   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HCC)   Hyponatremia   AMS (altered mental status)   Hypotension    Plan Sick sinus syndrome with sinus pauses plan is for permanent pacemaker with a Micra with Dr. Saralyn Pilar on Monday Altered mental status somewhat improved still with expressive aphasia continue current management Hypercalcemia improved related to what appears to be lung cancer History of DVT PE Right lung tumor right lower lobe squamous cell with bone mets patient is preop for radiation chemo but is preop for port as well as pacemaker implantation Hyponatremia  improved continue to follow electrolytes History of intracranial hemorrhage CVA Expressive aphasia due to intracranial hemorrhage follow-up with neurology Chronic atrial fibrillation currently not on anticoagulation because of recent intracranial hemorrhage also holding rate controlling drugs as they may be exacerbating his sick sinus syndrome and pauses Chronic systolic congestive heart failure reasonably stable will hopefully resume losartan and possibly beta-blockers after permanent  placement complaints   Yolonda Kida, MD 07/24/2022 9:34 AM

## 2022-07-24 NOTE — Progress Notes (Signed)
[facility] [place] NAME: [name]    MR#:  [id number]  DATE OF BIRTH:  05/16/1951  SUBJECTIVE:  Bodyache. Resting No family at bedside    VITALS:  Blood pressure 114/71, pulse 87, temperature 97.6 F (36.4 C), temperature source Oral, resp. rate 20, height '6\' 2"'$  (1.88 m), weight 82.5 kg, SpO2 100 %.  PHYSICAL EXAMINATION:   GENERAL:  72 y.o.-year-old patient with no acute distress. weak LUNGS: Normal breath sounds bilaterally, no wheezing CARDIOVASCULAR: S1, S2 normal. No murmur   ABDOMEN: Soft, nontender, nondistended. Bowel sounds present.  EXTREMITIES: No  edema b/l.    NEUROLOGIC: nonfocal  patient is alert  SKIN: No obvious rash, lesion, or ulcer.   LABORATORY PANEL:  CBC Recent Labs  Lab 07/24/22 0549  WBC 7.6  HGB 9.1*  HCT 29.2*  PLT 166    Chemistries  Recent Labs  Lab 07/19/22 1946 07/19/22 2319 07/24/22 0549  NA 130*   < > 130*  K 5.1   < > 3.8  CL 94*   < > 99  CO2 26   < > 23  GLUCOSE 119*   < > 103*  BUN 43*   < > 14  CREATININE 1.50*   < > 0.83  CALCIUM 13.3*   < > 9.6  MG 2.3   < > 1.9  AST 31  --   --   ALT 22  --   --   ALKPHOS 70  --   --   BILITOT 0.9  --   --    < > = values in this interval not displayed.   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 Patient was found to have hypercalcemia complicated by acute kidney injury, hyponatremia and pain and discomfort. Hospital course complicated by sinus pause and bradycardia therefore cardiology recommending pacemaker.  Tentative plans for pacemaker placement on Monday.     Hypercalcemia; 13.3 --Calcium is stable for now with IV fluids and calcitonin but I suspect recurrent issues while undergoing cancer treatment.  --   patient was on pamidronate.   --Patient will be getting outpatient chemotherapy and radiation starting next week.   --PT recommending SNF, will have to search for rehab which will simultaneously allow his cancer treatment and rehab. --Seen by oncology palliative care and oncology   Bradycardiac with Pauses History of chronic atrial fibrillation -Sinus pauses noted.  Holding AV nodal blockers.  -- [facility] cardiology following, tentative plans for pacemaker placement on Monday.   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 Janese Banks.  Postpone chemo port placement plans until cardiac pacemaker has been inserted.   Right shoulder pain Appears to be chronic secondary to bony and T1 invasion.   AKI (acute kidney injury) ([place]), resolved Baseline creatinine 0.8.  Creatinine peaked at 1.5   History of pulmonary embolus (PE); 2018 & 2023 Lovenox '1mg'$ /kg. Resume Xarelto upon dc.    AMS (altered mental status); Improved.  CT head is negative. Will monitor.    Chronic A fib CHF ef 50% On Xarelto at home.    After prolonged discussion with the family members, they would like to proceed with full scope of treatment.  Palliative care team  is also following along.   DVT prophylaxis: Lovenox '1mg'$ /kg for now and Xarelto at dc Code Status: Full  Family Communication: None at bedside today Level of care: Telemetry Medical Status is: Inpatient Remains inpatient appropriate because: PM Monday and then d/c planning    TOTAL TIME TAKING CARE OF THIS PATIENT: 35 minutes.  >50% time spent on counselling and coordination of care  Note: This dictation was prepared with Dragon dictation along with smaller phrase technology. Any transcriptional errors that result from this process are unintentional.  Fritzi Mandes M.D    [facility]   CC: Primary care physician; Center, [facility]

## 2022-07-24 NOTE — Plan of Care (Signed)

## 2022-07-24 NOTE — Consult Note (Signed)
ANTICOAGULATION CONSULT NOTE -   Pharmacy Consult for enoxaparin Indication: DVT  No Known Allergies  Patient Measurements: Height: '6\' 2"'$  (188 cm) Weight: 82.5 kg (181 lb 14.1 oz) IBW/kg (Calculated) : 82.2   Vital Signs: Temp: 97.8 F (36.6 C) (03/09 0417) BP: 125/81 (03/09 0417) Pulse Rate: 94 (03/09 0417)  Labs: Recent Labs    07/22/22 0438 07/23/22 0415 07/24/22 0549  HGB 9.2* 9.6* 9.1*  HCT 29.3* 30.9* 29.2*  PLT 201 210 166  CREATININE 0.76 0.80 0.83     Estimated Creatinine Clearance: 92.2 mL/min (by C-G formula based on SCr of 0.83 mg/dL).   Medical History: Past Medical History:  Diagnosis Date   Arthritis    Atrial fibrillation ([place])    Cancer ([place])    Prostate   CHF (congestive heart failure) (HCC)    Diabetes mellitus without complication ([place])    Gout current and history of   History of cocaine abuse ([place])    + UDS on admission   History of deep vein thrombosis 2006   History of tobacco abuse    has smoked for 50 years   Hypertension    Pulmonary embolism ([place]) 2010   Renal infarct ([place]) 03/2019   left renal infarct   Stroke ([place]) 2017   affected speech and right side-uses cane   Varicose veins of both lower extremities    Wears dentures    full upper, partial lower    Medications:  PTA rivaroxaban- last reported dose 3/3 Enoxaparin 40 mg SQ x 1 in ED 3/5 @ 0822  Assessment: Pharmacy consulted to dose enoxaparin in this 72 y.o. M who presented to the ED with Afib and Hx of DVT treated with rivaroxaban PTA.  Plan is to switch back to rivaroxaban at discharge.  CrCl 70.8, Wt 82.5 kg (BMI 23.35), INR 1.2, Hgb 9.8, Plt 210  Goal of Therapy:  Anti-Xa level: 0.6-1 units/mL, to be checked if clinically indicated  Monitor platelets by anticoagulation protocol: Yes   Plan:  Enoxaparin 82.5 mg SQ Q12H (1 mg/kg Q12H) Per MD: Please hold Sunday 3/10 evening dose in anticipation of pacemaker placement on Monday 3/11. Last dose of lovenox  should be Sunday morning 3/10  CBC daily while on therapeutic anticoagulation  Galen Russman A 07/24/2022,7:41 AM

## 2022-07-25 LAB — PTH-RELATED PEPTIDE: PTH-related peptide: <2 pmol/L

## 2022-07-25 NOTE — Progress Notes (Signed)
[facility] Cardiology    SUBJECTIVE: Sting comfortably denies any pain no shortness of breath answer some questions appropriately   Vitals:   07/24/22 0417 07/24/22 0830 07/24/22 1909 07/25/22 0747  BP: 125/81 114/71 107/68 116/84  Pulse: 94 87 95 93  Resp: '20 20 20 12  '$ Temp: 97.8 F (36.6 C) 97.6 F (36.4 C) 97.9 F (36.6 C) 98.9 F (37.2 C)  TempSrc:  Oral Oral   SpO2: 100% 100% 99% 100%  Weight:      Height:         Intake/Output Summary (Last 24 hours) at 07/25/2022 1256 Last data filed at 07/25/2022 0615 Gross per 24 hour  Intake 480 ml  Output 650 ml  Net -170 ml      PHYSICAL EXAM  General: Well developed, well nourished, in no acute distress HEENT:  Normocephalic and atramatic Neck:  No JVD.  Lungs: Clear bilaterally to auscultation and percussion. Heart: HRRR . Normal S1 and S2 without gallops or murmurs.  Abdomen: Bowel sounds are positive, abdomen soft and non-tender  Msk:  Back normal, normal gait. Normal strength and tone for age. Extremities: No clubbing, cyanosis or edema.   Neuro: Alert disoriented with some dementia Psych: Underlying dementia with some lethargy   LABS: Basic Metabolic Panel: Recent Labs    07/23/22 0415 07/24/22 0549  NA 131* 130*  K 3.3* 3.8  CL 98 99  CO2 24 23  GLUCOSE 109* 103*  BUN 17 14  CREATININE 0.80 0.83  CALCIUM 10.7* 9.6  MG 1.8 1.9   Liver Function Tests: No results for input(s): "AST", "ALT", "ALKPHOS", "BILITOT", "PROT", "ALBUMIN" in the last 72 hours. No results for input(s): "LIPASE", "AMYLASE" in the last 72 hours. CBC: Recent Labs    07/23/22 0415 07/24/22 0549  WBC 8.3 7.6  HGB 9.6* 9.1*  HCT 30.9* 29.2*  MCV 77.1* 77.2*  PLT 210 166   Cardiac Enzymes: No results for input(s): "CKTOTAL", "CKMB", "CKMBINDEX", "TROPONINI" in the last 72 hours. BNP: Invalid input(s): "POCBNP" D-Dimer: No results for input(s): "DDIMER" in the last 72 hours. Hemoglobin A1C: No results for input(s): "HGBA1C" in  the last 72 hours. Fasting Lipid Panel: No results for input(s): "CHOL", "HDL", "LDLCALC", "TRIG", "CHOLHDL", "LDLDIRECT" in the last 72 hours. Thyroid Function Tests: No results for input(s): "TSH", "T4TOTAL", "T3FREE", "THYROIDAB" in the last 72 hours.  Invalid input(s): "FREET3" Anemia Panel: No results for input(s): "VITAMINB12", "FOLATE", "FERRITIN", "TIBC", "IRON", "RETICCTPCT" in the last 72 hours.  No results found.   Echo mildly depressed left ventricular function EF around 45 to 50% recent echo from about 1 year ago  TELEMETRY: Atrial fibrillation rate of 85:  ASSESSMENT AND PLAN:  Principal Problem:   Hypercalcemia Active Problems:   AKI (acute kidney injury) ([place])   Right shoulder pain   History of DVT (deep vein thrombosis)   History of pulmonary embolus (PE)   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HCC)   Hyponatremia   AMS (altered mental status)   Hypotension     Plan Sick sinus syndrome preop for permanent pacemaker placement with Micra in the morning Altered mental status somewhat improved still has underlying dementia History of Pancoast tumor right lung continue hematology oncology workup and management Patient preop for port for chemo will be cleared to do so after permanent pacemaker placement   Yolonda Kida, MD 07/25/2022 12:56 PM

## 2022-07-25 NOTE — Progress Notes (Signed)
[facility] [place] NAME: [name]    MR#:  [id number]  DATE OF BIRTH:  1951-03-29  SUBJECTIVE:  Bodyache. Resting No family at bedside Pt aware of PM placement tomorrow   VITALS:  Blood pressure 116/84, pulse 93, temperature 98.9 F (37.2 C), resp. rate 12, height '6\' 2"'$  (1.88 m), weight 82.5 kg, SpO2 100 %.  PHYSICAL EXAMINATION:   GENERAL:  72 y.o.-year-old patient with no acute distress. weak LUNGS: Normal breath sounds bilaterally, no wheezing CARDIOVASCULAR: S1, S2 normal. No murmur   ABDOMEN: Soft, nontender, nondistended. Bowel sounds present.  EXTREMITIES: No  edema b/l.    NEUROLOGIC: nonfocal  patient is alert  SKIN: No obvious rash, lesion, or ulcer.   LABORATORY PANEL:  CBC Recent Labs  Lab 07/24/22 0549  WBC 7.6  HGB 9.1*  HCT 29.2*  PLT 166     Chemistries  Recent Labs  Lab 07/19/22 1946 07/19/22 2319 07/24/22 0549  NA 130*   < > 130*  K 5.1   < > 3.8  CL 94*   < > 99  CO2 26   < > 23  GLUCOSE 119*   < > 103*  BUN 43*   < > 14  CREATININE 1.50*   < > 0.83  CALCIUM 13.3*   < > 9.6  MG 2.3   < > 1.9  AST 31  --   --   ALT 22  --   --   ALKPHOS 70  --   --   BILITOT 0.9  --   --    < > = values in this interval not displayed.    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 Patient was found to have hypercalcemia complicated by acute kidney injury, hyponatremia and pain and discomfort. Hospital course complicated by sinus pause and bradycardia therefore cardiology recommending pacemaker.  Tentative plans for pacemaker placement on Monday.     Hypercalcemia; 13.3 --Calcium is stable for now with IV fluids and calcitonin but I suspect recurrent issues while undergoing cancer treatment.   --  patient was on pamidronate.   --Patient will be getting outpatient chemotherapy and radiation starting next week.   --PT recommending SNF, will have to search for rehab which will simultaneously allow his cancer treatment and rehab. --Seen by oncology palliative care and oncology   Bradycardiac with Pauses History of chronic atrial fibrillation -Sinus pauses noted.  Holding AV nodal blockers.  -- [facility] cardiology following, tentative plans for pacemaker placement on Monday.   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 Janese Banks.  Postpone chemo port placement plans until cardiac pacemaker has been inserted.   Right shoulder pain Appears to be chronic secondary to bony and T1 invasion.   AKI (acute kidney injury) ([place]), resolved Baseline creatinine 0.8.  Creatinine peaked at 1.5   History of pulmonary embolus (PE); 2018 & 2023 Lovenox '1mg'$ /kg. Resume Xarelto upon dc.    AMS (altered mental status); Improved.  CT head is negative. Will monitor.    Chronic A fib CHF ef 50% On Xarelto at home.    After prolonged discussion with the family members, they would like to proceed with full scope of treatment.  Palliative care team is also following along.   DVT prophylaxis: Lovenox '1mg'$ /kg for now and Xarelto at dc Code Status: Full  Family Communication: None at bedside today Level of care: Telemetry Medical Status is: Inpatient Remains inpatient appropriate because: PM Monday and then d/c planning    TOTAL TIME TAKING CARE OF THIS PATIENT: 35 minutes.  >50% time spent on counselling and coordination of care  Note: This dictation was prepared with Dragon dictation along with smaller phrase technology. Any transcriptional errors that result from this process are unintentional.  Fritzi Mandes M.D    [facility]   CC: Primary care physician; Center, [facility]

## 2022-07-26 ENCOUNTER — Ambulatory Visit: Payer: Medicare Other

## 2022-07-26 ENCOUNTER — Inpatient Hospital Stay: Payer: 59 | Admitting: Hospice and Palliative Medicine

## 2022-07-26 ENCOUNTER — Inpatient Hospital Stay: Payer: 59

## 2022-07-26 ENCOUNTER — Ambulatory Visit: Payer: Medicare Other | Admitting: Hospice and Palliative Medicine

## 2022-07-26 ENCOUNTER — Ambulatory Visit: Payer: 59

## 2022-07-26 ENCOUNTER — Other Ambulatory Visit: Payer: Self-pay

## 2022-07-26 ENCOUNTER — Other Ambulatory Visit: Payer: Medicare Other

## 2022-07-26 ENCOUNTER — Encounter: Admission: EM | Disposition: A | Discharge status: Home Health | Payer: Self-pay | Attending: Internal Medicine

## 2022-07-26 ENCOUNTER — Inpatient Hospital Stay: Payer: 59 | Admitting: Internal Medicine

## 2022-07-26 HISTORY — PX: PACEMAKER LEADLESS INSERTION: EP1219

## 2022-07-26 LAB — CREATININE, SERUM
Creatinine, Ser: 0.79 mg/dL (ref 0.61–1.24)
GFR, Estimated: >60 mL/min (ref >60)

## 2022-07-26 SURGERY — PACEMAKER LEADLESS INSERTION
Anesthesia: Moderate Sedation

## 2022-07-26 MED ORDER — SODIUM CHLORIDE 0.9 % IV SOLN
80.0000 mg | Freq: Once | INTRAVENOUS | Status: DC
  Filled 2022-07-26: qty 2

## 2022-07-26 MED ORDER — METOPROLOL TARTRATE 25 MG PO TABS
12.5000 mg | ORAL_TABLET | Freq: Two times a day (BID) | ORAL | Status: DC
  Administered 2022-07-26 – 2022-07-27 (×2): 12.5 mg via ORAL
  Filled 2022-07-26 (×2): qty 1

## 2022-07-26 MED ORDER — ONDANSETRON HCL 4 MG/2ML IJ SOLN: 4.0000 mg | Freq: Four times a day (QID) | INTRAMUSCULAR | Status: DC | PRN

## 2022-07-26 MED ORDER — ACETAMINOPHEN 325 MG PO TABS
650.0000 mg | ORAL_TABLET | ORAL | Status: DC | PRN
  Administered 2022-07-27 (×2): 650 mg via ORAL
  Filled 2022-07-26 (×2): qty 2

## 2022-07-26 MED ORDER — CEFAZOLIN SODIUM-DEXTROSE 2-4 GM/100ML-% IV SOLN
INTRAVENOUS | Status: AC
  Filled 2022-07-26: qty 100

## 2022-07-26 MED ORDER — IOHEXOL 300 MG/ML  SOLN
INTRAMUSCULAR | Status: DC | PRN
  Administered 2022-07-26: 10 mL

## 2022-07-26 MED ORDER — OXYCODONE HCL 5 MG PO TABS
ORAL_TABLET | ORAL | Status: AC
  Filled 2022-07-26: qty 2

## 2022-07-26 MED ORDER — HEPARIN SODIUM (PORCINE) 1000 UNIT/ML IJ SOLN
INTRAMUSCULAR | Status: AC
  Filled 2022-07-26: qty 10

## 2022-07-26 MED ORDER — SODIUM CHLORIDE 0.9% FLUSH
3.0000 mL | Freq: Two times a day (BID) | INTRAVENOUS | Status: DC
  Administered 2022-07-27: 3 mL via INTRAVENOUS

## 2022-07-26 MED ORDER — FENTANYL CITRATE (PF) 100 MCG/2ML IJ SOLN
INTRAMUSCULAR | Status: DC | PRN
  Administered 2022-07-26 (×2): 25 ug via INTRAVENOUS

## 2022-07-26 MED ORDER — SODIUM CHLORIDE 0.9% FLUSH
3.0000 mL | INTRAVENOUS | Status: DC | PRN
  Administered 2022-07-26: 3 mL via INTRAVENOUS

## 2022-07-26 MED ORDER — SODIUM CHLORIDE 0.9 % IV SOLN: 250.0000 mL | INTRAVENOUS | Status: DC | PRN

## 2022-07-26 MED ORDER — HEPARIN (PORCINE) IN NACL 2000-0.9 UNIT/L-% IV SOLN
INTRAVENOUS | Status: DC | PRN
  Administered 2022-07-26: 1000 mL

## 2022-07-26 MED ORDER — FENTANYL CITRATE (PF) 100 MCG/2ML IJ SOLN
INTRAMUSCULAR | Status: AC
  Filled 2022-07-26: qty 2

## 2022-07-26 MED ORDER — HEPARIN SODIUM (PORCINE) 1000 UNIT/ML IJ SOLN
INTRAMUSCULAR | Status: DC | PRN
  Administered 2022-07-26: 5000 [IU] via INTRAVENOUS

## 2022-07-26 SURGICAL SUPPLY — 13 items
DILATOR VESSEL 38 20CM 12FR (INTRODUCER) IMPLANT
DILATOR VESSEL 38 20CM 14FR (INTRODUCER) IMPLANT
DILATOR VESSEL 38 20CM 18FR (INTRODUCER) IMPLANT
DILATOR VESSEL 38 20CM 8FR (INTRODUCER) IMPLANT
MICRA INTRODUCER SHEATH (SHEATH) ×1
NDL PERC 18GX7CM (NEEDLE) IMPLANT
NEEDLE PERC 18GX7CM (NEEDLE) ×1 IMPLANT
PACEMAKER LEADLESS AV2 MICRA (Pacemaker) IMPLANT
PACK CARDIAC CATH (CUSTOM PROCEDURE TRAY) ×1 IMPLANT
PAD ELECT DEFIB RADIOL ZOLL (MISCELLANEOUS) IMPLANT
SHEATH AVANTI 7FRX11 (SHEATH) IMPLANT
SHEATH INTRODUCER MICRA (SHEATH) IMPLANT
WIRE AMPLATZ SS-J .035X180CM (WIRE) IMPLANT

## 2022-07-26 NOTE — Progress Notes (Signed)
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 disease, similar to the prior MRI. A small chronic right cerebellar infarct is unchanged from the prior MRI. There is mild cerebral atrophy. A cavum septum pellucidum et vergae is noted. No abnormal enhancement is identified, although postcontrast imaging is moderately motion degraded which reduces the sensitivity for detection of very small lesions. Vascular: Small distal right vertebral artery, likely with chronically diminished flow or possibly chronic occlusion. Skull and upper cervical spine: No suspicious marrow lesion. Sinuses/Orbits: Bilateral cataract extraction. Minimal mucosal thickening in the ethmoid sinuses. Clear mastoid air cells. Other: None. IMPRESSION: 1. No evidence of intracranial metastases. 2. Multiple chronic infarcts as above. Electronically Signed   By: ALogan BoresM.D.   On: 06/28/2022 14:00    ECHO 09/2021  1. Left ventricular ejection fraction, by estimation, is 45 to 50%. The  left ventricle has mildly decreased function. The left ventricle  demonstrates  global hypokinesis. There is mild left ventricular  hypertrophy. Left ventricular diastolic parameters  are indeterminate.   2. Right ventricular systolic function is mildly reduced. The right  ventricular size is mildly enlarged. Tricuspid regurgitation signal is  inadequate for assessing PA pressure.   3. Left atrial size was severely dilated.  4. Right atrial size was severely dilated.   5. The mitral valve is abnormal. Mild mitral valve regurgitation.   6. The aortic valve is tricuspid. There is mild calcification of the  aortic valve. There is mild thickening of the aortic valve. Aortic valve  regurgitation is trivial.   7. The inferior vena cava is dilated in size with >50% respiratory  variability, suggesting right atrial pressure of 8 mmHg.   TELEMETRY reviewed by me (LT) 07/26/2022 : none available for review  EKG reviewed by me: AF 98 BPM  Data reviewed by me (LT) 07/26/2022: hospitalist progress note, oncology progress note, last 24h vitals tele labs imaging I/O    Principal Problem:   Hypercalcemia Active Problems:   AKI (acute kidney injury) ([place])   Right shoulder pain   History of DVT (deep vein thrombosis)   History of pulmonary embolus (PE)   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HCC)   Hyponatremia   AMS (altered mental status)   Hypotension    ASSESSMENT AND PLAN:  [name] is a 60yoM with a PMH of chronic atrial fibrillation on Eliquis, HFrEF (EF 40-45%), hx CVA with residual expressive aphasia, hx DVT/PE, HTN, HLD, DM2, 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  He has been treated w IV fluids and calcitonin with improvement. Overnight from 3/5 - 3/6 nursing staff notified cross coverage of multiple sinus pauses on telemetry, the longest 7.4 seconds while patient was sleeping.  S/p successful Med[employer]onic Micra leadless pacemaker implantation  07/26/2022.  # Hypercalcemia # RUL SCC with bone metastasis, pending chemo and radiation # hx CVA with residual expressive aphasia Presented from [facility] with serum calcium at 14, improvement after IV fluids and calcitonin, oncology following, planning to start chemo and radiation within the next week/as soon as port can be placed  # Sinus pauses # Chronic atrial fibrillation # S/p Med[employer] Micra leadless pacemaker implantation 07/26/2022 Noted on telemetry by nursing staff with multiple 2-4-second sinus pauses, the longest pause of concern at 7.3 seconds while the patient was sleeping.  -Monitor and correct electrolytes for a K>4 and mag >2  -Start metoprolol 12.5 mg twice daily -lovenox for stroke prophylaxis for now, last dose Sunday 3/10 in AM. If no plans for port placement during this admission, would restart anticoagulation tomorrow morning 3/12 with therapeutic Lovenox, and restart Xarelto 20 mg daily at discharge -He is okay for discharge from a cardiac perspective tomorrow morning, 3/12 following removal of figure-of-eight suture from his right femoral vein access site which I will do tomorrow morning.  Will place aftercare and discharge instructions in the patient's chart.  # Chronic HFrEF Euvolemic on exam. -Start metoprolol tartrate 12.5 mg twice daily -Restart losartan as BP tolerates  This patient's plan of care was discussed and created with Dr. Saralyn Pilar and he is in agreement.  Signed: Tristan Schroeder , PA-C 07/26/2022, 1:57 PM [facility]

## 2022-07-26 NOTE — Progress Notes (Signed)
[facility] [place] NAME: [name]    MR#:  [id number]  DATE OF BIRTH:  1951/02/04  SUBJECTIVE:  Seen in special recovery. No complaints No family at bedside    VITALS:  Blood pressure 123/74, pulse 74, temperature 98.4 F (36.9 C), temperature source Oral, resp. rate 20, height '6\' 2"'$  (1.88 m), weight 82.5 kg, SpO2 100 %.  PHYSICAL EXAMINATION:   GENERAL:  72 y.o.-year-old patient with no acute distress. weak LUNGS: Normal breath sounds bilaterally, no wheezing CARDIOVASCULAR: S1, S2 normal. No murmur   ABDOMEN: Soft, nontender, nondistended. Bowel sounds present.  EXTREMITIES: No  edema b/l.    NEUROLOGIC: nonfocal  patient is alert  SKIN: No obvious rash, lesion, or ulcer.   LABORATORY PANEL:  CBC Recent Labs  Lab 07/24/22 0549  WBC 7.6  HGB 9.1*  HCT 29.2*  PLT 166     Chemistries  Recent Labs  Lab 07/19/22 1946 07/19/22 2319 07/24/22 0549 07/26/22 0625  NA 130*   < > 130*  --   K 5.1   < > 3.8  --   CL 94*   < > 99  --   CO2 26   < > 23  --   GLUCOSE 119*   < > 103*  --   BUN 43*   < > 14  --   CREATININE 1.50*   < > 0.83 0.79  CALCIUM 13.3*   < > 9.6  --   MG 2.3   < > 1.9  --   AST 31  --   --   --   ALT 22  --   --   --   ALKPHOS 70  --   --   --   BILITOT 0.9  --   --   --    < > = values in this interval not displayed.    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 Patient was found to have hypercalcemia complicated by acute kidney injury, hyponatremia and pain and discomfort. Hospital course complicated by sinus pause and bradycardia therefore cardiology recommending pacemaker.  Tentative plans for pacemaker placement on Monday.     Hypercalcemia; 13.3 --Calcium is stable for now  with IV fluids and calcitonin but I suspect recurrent issues while undergoing cancer treatment.  --  patient was on pamidronate.   --Patient will be getting outpatient chemotherapy and radiation starting next week.   --PT recommending SNF, will have to search for rehab which will simultaneously allow his cancer treatment and rehab. --Seen by oncology palliative care and oncology -calcium normalized   Bradycardiac with Pauses History of chronic atrial fibrillation -Sinus pauses noted.  Holding AV nodal blockers.  -- [facility] cardiology following--s/p PM placement by Dr Josefa Half --cardiology ok to [facility] placed   Hyponatremia Stable   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[place]) Squamous cell cancer of the lungs Follows with Dr. Janese Banks.  -- chemo port placement to be scheduled by IR  Right shoulder pain Appears to be chronic secondary to bony and T1 invasion.   AKI (acute kidney injury) ([place]), resolved Baseline creatinine 0.8.  Creatinine peaked at 1.5   History of pulmonary embolus (PE); 2018 & 2023 Lovenox '1mg'$ /kg. Resume Xarelto upon dc.    AMS (  altered mental status); Improved.  CT head is negative. Will monitor.    Chronic A fib CHF ef 50% On Xarelto at home.    After prolonged discussion with the family members, they would like to proceed with full scope of treatment.  Palliative care team is also following along.   DVT prophylaxis: Lovenox '1mg'$ /kg for now and Xarelto at dc Code Status: Full  Family Communication: None at bedside today Level of care: Telemetry Cardiac Status is: Inpatient Remains inpatient appropriate because: need port placement    TOTAL TIME TAKING CARE OF THIS PATIENT: 35 minutes.  >50% time spent on counselling and coordination of care  Note: This dictation was prepared with Dragon dictation along with smaller phrase technology. Any transcriptional errors that result from this process are unintentional.  Fritzi Mandes M.D    [facility]   CC: Primary  care physician; Center, [facility]

## 2022-07-26 NOTE — Discharge Instructions (Signed)
Please avoid showering/submerging yourself in water for the next week. If your bandage gets wet or starts to fall off, replace it with another piece of gauze and the Tegaderm (clear bandage I provided). You should try to keep it covered for a week. You will follow up with Dr. Clayborn Bigness in the office in about 1 week. If you have bleeding from the site, lie down and apply firm pressure for 20 minutes, if it continues to bleed, call our office [phone]). Avoid heavy lifting, squatting (other than sitting) and strenuous activity for 1 week. You will get a call from [employer] to set up your pacemaker and the [employer] device. You do not need to bring this device with you to appointments. It is used to monitor your pacemaker from home.   Patient to go to IR dept tomorrow 07/28/2022 for port placement at 11:30 am. Arrive in the IR dept at 10:30 am. Patient need to be NPO at midnight (tonite)

## 2022-07-26 NOTE — Progress Notes (Signed)
Patient taken to special procedures for pacemaker placement at shift change, no assessment done prior to going to specials.

## 2022-07-27 ENCOUNTER — Ambulatory Visit: Payer: 59

## 2022-07-27 ENCOUNTER — Encounter: Payer: Self-pay | Admitting: Cardiology

## 2022-07-27 LAB — BASIC METABOLIC PANEL
Anion gap: 5 (ref 5–15)
BUN: 12 mg/dL (ref 8–23)
CO2: 23 mmol/L (ref 22–32)
Calcium: 8 mg/dL — ABNORMAL LOW (ref 8.9–10.3)
Chloride: 100 mmol/L (ref 98–111)
Creatinine, Ser: 0.68 mg/dL (ref 0.61–1.24)
GFR, Estimated: >60 mL/min (ref >60)
Glucose, Bld: 121 mg/dL — ABNORMAL HIGH (ref 70–99)
Potassium: 3.6 mmol/L (ref 3.5–5.1)
Sodium: 128 mmol/L — ABNORMAL LOW (ref 135–145)

## 2022-07-27 MED ORDER — GABAPENTIN 400 MG PO CAPS: 400.0000 mg | ORAL_CAPSULE | Freq: Two times a day (BID) | ORAL | 1 refills | Status: DC

## 2022-07-27 MED ORDER — RIVAROXABAN 20 MG PO TABS: 20.0000 mg | ORAL_TABLET | Freq: Every day | ORAL | Status: DC

## 2022-07-27 MED ORDER — OXYCODONE HCL 5 MG PO TABS: 5.0000 mg | ORAL_TABLET | Freq: Four times a day (QID) | ORAL | 0 refills | Status: DC | PRN

## 2022-07-27 NOTE — Progress Notes (Signed)
    Durable Medical Equipment  (From admission, onward)           Start     Ordered   07/27/22 1124  For home use only DME wheelchair cushion (seat and back)  Once        07/27/22 1123

## 2022-07-27 NOTE — Progress Notes (Signed)
Patient suffers from stroke and right side weakness which impairs their ability to perform daily activities like moving around in and outside the home. A cane, walker, or crutch will not resolve the issue with performing activities of daily living. A wheelchair will allow patient to safely perform daily activities. Patient is not able to propel themselves in the home using a standard wheelchair due to weakness. Patient can safely propel in the lightweight wheelchair.

## 2022-07-27 NOTE — Progress Notes (Signed)
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 deposition which is greatest in the right MCA infarct. Elsewhere, small T2 hyperintensities elsewhere in the cerebral white matter and patchy T2 hyperintensity in the pons are nonspecific but compatible with mild chronic small vessel ischemic disease, similar to the prior MRI. A small chronic right cerebellar infarct is unchanged from the prior MRI. There is mild cerebral atrophy. A cavum septum pellucidum et vergae is noted. No abnormal enhancement is identified, although postcontrast imaging is moderately motion degraded which reduces the sensitivity for detection of very small lesions. Vascular: Small distal right vertebral artery, likely with chronically diminished flow or possibly chronic occlusion. Skull and upper cervical spine: No suspicious marrow lesion. Sinuses/Orbits: Bilateral cataract extraction. Minimal mucosal thickening in the ethmoid sinuses. Clear mastoid air cells. Other: None. IMPRESSION: 1. No evidence of intracranial metastases. 2. Multiple chronic  infarcts as above. Electronically Signed   By: ALogan BoresM.D.   On: 06/28/2022 14:00    ECHO 09/2021  1. Left ventricular ejection fraction, by estimation, is 45 to 50%. The  left ventricle has mildly decreased function. The left ventricle  demonstrates global hypokinesis. There is mild left ventricular  hypertrophy. Left ventricular diastolic parameters  are indeterminate.   2. Right ventricular systolic function  is mildly reduced. The right  ventricular size is mildly enlarged. Tricuspid regurgitation signal is  inadequate for assessing PA pressure.   3. Left atrial size was severely dilated.   4. Right atrial size was severely dilated.   5. The mitral valve is abnormal. Mild mitral valve regurgitation.   6. The aortic valve is tricuspid. There is mild calcification of the  aortic valve. There is mild thickening of the aortic valve. Aortic valve  regurgitation is trivial.   7. The inferior vena cava is dilated in size with >50% respiratory  variability, suggesting right atrial pressure of 8 mmHg.   TELEMETRY reviewed by me (LT) 07/27/2022 : none available for review  EKG reviewed by me: AF 98 BPM  Data reviewed by me (LT) 07/27/2022: hospitalist progress note, oncology progress note, last 24h vitals tele labs imaging I/O    Principal Problem:   Hypercalcemia Active Problems:   AKI (acute [name] injury) ([place])   Right shoulder pain   History of DVT (deep vein thrombosis)   History of pulmonary embolus (PE)   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HCC)   Hyponatremia   AMS (altered mental status)   Hypotension    ASSESSMENT AND PLAN:  [name] is a 1yoM with a PMH of chronic atrial fibrillation on Eliquis, HFrEF (EF 40-45%), hx CVA with residual expressive aphasia, hx DVT/PE, HTN, HLD, DM2, 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  He has been treated w IV fluids  and calcitonin with improvement. Overnight from 3/5 - 3/6 nursing staff notified cross coverage of multiple sinus pauses on telemetry, the longest 7.4 seconds while patient was sleeping.  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  # Hypercalcemia # RUL SCC with bone metastasis, pending chemo and radiation # hx CVA with residual expressive aphasia Presented from [facility] with serum calcium at 14, improvement after IV fluids and calcitonin, oncology following, planning to start chemo and radiation within the next week. -IR planning for port placement tomorrow, 3/14.   # Sinus pauses # Chronic atrial fibrillation # S/p Medtronic Micra leadless pacemaker implantation 07/26/2022 Noted on telemetry by nursing staff with multiple 2-4-second sinus pauses, the longest pause of concern at 7.3 seconds while the patient was sleeping.  -Monitor and correct electrolytes for a K>4 and mag >2  -continue metoprolol 12.5 mg twice daily -restart xarelto '20mg'$  daily today, ok for this per IR -He is okay for discharge from a cardiac perspective today. Aftercare and discharge instructions in the patient's chart.  # Chronic HFrEF Euvolemic on exam. -continue metoprolol tartrate 12.5 mg twice daily -Restart losartan as BP tolerates  This patient's plan of care was discussed and created with Dr. Saralyn Pilar and he is in agreement.  Signed: Tristan Schroeder , PA-C 07/27/2022, 9:23 AM [facility]

## 2022-07-27 NOTE — Care Management Important Message (Signed)
Important Message  Patient Details  Name: [name] MRN: [id number] Date of Birth: [birth year]   Medicare Important Message Given:  Yes     [name] 07/27/2022, 11:31 AM

## 2022-07-27 NOTE — TOC Transition Note (Signed)
Transition of Care [facility]) - CM/SW Discharge Note   Patient Details  Name: [name] MRN: [id number] Date of Birth: 06-11-1950  Transition of Care [facility]) CM/SW Contact:  Laurena Slimmer, RN Phone Number: 07/27/2022, 10:17 AM   Clinical Narrative:    Spoke with patient's daughter regarding possible discharge for today. She was advised patient has been previously set up with [facility]. She declined all other DME except a WC.   Request for [facility] sent to [facility] at [place].      Barriers to Discharge: Continued Medical Work up   Patient Goals and CMS Choice      Discharge Placement                         Discharge Plan and Services Additional resources added to the After Visit Summary for       Post Acute Care Choice: [place]                               Social Determinants of Health (SDOH) Interventions SDOH Screenings   Food Insecurity: No Food Insecurity (07/20/2022)  Housing: Low Risk  (07/20/2022)  Transportation Needs: No Transportation Needs (07/20/2022)  Utilities: Not At Risk (07/20/2022)  Alcohol Screen: Low Risk  (06/21/2022)  Depression (PHQ2-9): Medium Risk (06/18/2022)  Financial Resource Strain: Low Risk  (06/21/2022)  Physical Activity: Inactive (06/21/2022)  Social Connections: Socially Isolated (06/21/2022)  Stress: Stress Concern Present (06/21/2022)  Tobacco Use: High Risk (07/27/2022)     Readmission Risk Interventions    01/29/2020   12:41 PM  Readmission Risk Prevention Plan  Transportation Screening Complete  Palliative Care Screening Not Applicable  Medication Review (RN Care Manager) Complete

## 2022-07-27 NOTE — Discharge Summary (Signed)
Physician Discharge Summary   Patient: [name] MRN: [id number] DOB: 1950-05-25  Admit date:     07/19/2022  Discharge date: 07/27/22  Discharge Physician: Fritzi Mandes   PCP: [facility]   Recommendations at discharge:   Pt to go to IR dept 07/28/2022 for [facility] [place] placement Go to cancer center on 07/30/22 for initiation of chemotherapy F/u Dr Clayborn Bigness in 1 week F/u PCP in 1-2 weeks  Discharge Diagnoses: Principal Problem:   Hypercalcemia Active Problems:   Right shoulder pain   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HCC)   Hyponatremia   AKI (acute kidney injury) ([place])   History of DVT (deep vein thrombosis)   History of pulmonary embolus (PE)   AMS (altered mental status)   Hypotension  72 year old with history of 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 Patient was found to have hypercalcemia complicated by acute kidney injury, hyponatremia and pain and discomfort. Hospital course complicated by sinus pause and bradycardia therefore cardiology recommending pacemaker.  Tentative plans for pacemaker placement on Monday.     Hypercalcemia; 13.3 --Calcium is stable for now with IV fluids and calcitonin but I suspect recurrent issues while undergoing cancer treatment.  --  patient was on pamidronate.   --Patient will be getting outpatient chemotherapy and radiation starting next week.   --PT recommending SNF, will have to search for rehab which will simultaneously allow his cancer treatment and rehab. --Seen by oncology palliative care and oncology -calcium normalized   Bradycardiac with Pauses History of chronic atrial fibrillation -Sinus pauses noted.  Holding AV nodal blockers.  -- [facility] cardiology following--s/pMicralead  PM placement by Dr Josefa Half on 07/27/22 --cardiology ok to [facility] placed.  Xarelto resumed --ok with IR to resume it   Hyponatremia Stable   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[place]) Squamous cell cancer of the lungs Follows with Dr. Janese Banks.  -- chemo port placement to be scheduled by IR for 07/28/2022   Right shoulder pain Appears to be chronic secondary to bony and T1 invasion.   AKI (acute kidney injury) ([place]), resolved Baseline creatinine 0.8.  Creatinine peaked at 1.5   History of pulmonary embolus (PE); 2018 & 2023 Lovenox '1mg'$ /kg. Resumed  Xarelto upon dc.    AMS (altered mental status); Improved.  CT head is negative.    Chronic A fib CHF ef 50% On Xarelto at home.    After prolonged discussion with the family members, they would like to proceed with full scope of treatment.  Palliative care team is also following along.  Will d/c home today with HHPT.   DVT prophylaxis: Lovenox '1mg'$ /kg for now and Xarelto at dc Code Status: Full  Family Communication: spoke with dter [name]    Pain control - [employer] Controlled Substance Reporting System database was reviewed. and patient was instructed, not to drive, operate heavy machinery, perform activities at heights, swimming or participation in water activities or provide baby-sitting services while on Pain, Sleep and Anxiety Medications; until their outpatient Physician has advised to do so again. Also recommended to not to take more than prescribed Pain, Sleep and Anxiety Medications.  Consultants: IR,Oncology, [facility] cardiology Procedures performed: PM placement  Disposition: Home health Diet recommendation:  Discharge Diet Orders (From admission, onward)     Start     Ordered   07/27/22 0000  Diet - low  sodium heart healthy        07/27/22 0928           Cardiac diet DISCHARGE MEDICATION: Allergies as of 07/27/2022   No Known Allergies      Medication List     STOP taking these medications    furosemide 40 MG tablet Commonly known as: LASIX   losartan 50 MG tablet Commonly known  as: COZAAR   morphine 30 MG tablet Commonly known as: MSIR       TAKE these medications    albuterol 108 (90 Base) MCG/ACT inhaler Commonly known as: VENTOLIN HFA Inhale 2 puffs into the lungs every 6 (six) hours as needed for wheezing or shortness of breath.   allopurinol 100 MG tablet Commonly known as: ZYLOPRIM Take 100 mg by mouth daily.   barrier cream Crea Commonly known as: non-specified Apply topically to buttocks every shift (3 times daily).   docusate sodium 100 MG capsule Commonly known as: COLACE Take 100 mg by mouth 2 (two) times daily.   FeroSul 325 (65 FE) MG tablet Generic drug: ferrous sulfate Take 0.5 tablets by mouth daily with breakfast.   gabapentin 400 MG capsule Commonly known as: NEURONTIN Take 1 capsule (400 mg total) by mouth 2 (two) times daily. What changed: when to take this   latanoprost 0.005 % ophthalmic solution Commonly known as: XALATAN Place 1 drop into both eyes at bedtime.   metoprolol succinate 25 MG 24 hr tablet Commonly known as: TOPROL-XL Take 12.5 mg by mouth daily.   multivitamin with minerals Tabs tablet Take 1 tablet by mouth daily.   Narcan 4 MG/0.1ML Liqd nasal spray kit Generic drug: naloxone   oxyCODONE 5 MG immediate release tablet Commonly known as: Oxy IR/ROXICODONE Take 1 tablet (5 mg total) by mouth every 6 (six) hours as needed for severe pain. What changed:  when to take this reasons to take this   rivaroxaban 20 MG Tabs tablet Commonly known as: XARELTO Take 1 tablet (20 mg total) by mouth daily with supper.   senna 8.6 MG Tabs tablet Commonly known as: SENOKOT Take 17.2 mg by mouth daily.   simvastatin 20 MG tablet Commonly known as: ZOCOR Take 20 mg by mouth at bedtime.        Follow-up Information     [facility] Follow up.   Why: They will follow up with you for your home health needs.        Callwood, Dwayne D, MD. Go in 1 week(s).   Specialties: Cardiology, Internal  Medicine Contact information: [place] [address] [place], [facility]. Schedule an appointment as soon as possible for a visit in 1 week(s).   Contact information: [place] [address] [phone]         Sindy Guadeloupe, MD. Go on 07/30/2022.   Specialty: Oncology Why: for your chemotherapy Contact information: [place] [address] [phone]                 Danley Danker Weights   07/19/22 1926  Weight: 82.5 kg     Condition at discharge: fair  The results of significant diagnostics from this hospitalization (including imaging, microbiology, ancillary and laboratory) are listed below for reference.   Imaging Studies: EP PPM/ICD IMPLANT  Result Date: 07/26/2022 Successful Micra AV 2 leadless pacemaker implantation   CT CHEST WO CONTRAST  Result Date: 07/19/2022  CLINICAL DATA:  Altered mental status, and abnormal labs with low calcium. 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 EXAM: CT HEAD WITHOUT CONTRAST CT CHEST WITHOUT CONTRAST TECHNIQUE: Contiguous axial images were obtained from the base of the skull through the vertex without intravenous contrast, with multiplanar reconstructions. Multidetector CT imaging of the chest was performed following the standard protocol without IV contrast, with multiplanar reconstructions. RADIATION DOSE REDUCTION: This exam was performed according to the departmental dose-optimization program which includes automated exposure control, adjustment of the mA and/or kV according to patient size and/or use of iterative reconstruction technique. COMPARISON:  CT scan head 06/25/2022, PET-CT 06/25/2022, chest CT without contrast 06/11/2022. FINDINGS: CT HEAD FINDINGS Brain: Again noted are chronic bilateral occipital lobe infarcts, and encephalomalacia due to a chronic left frontoparietal infarct. A  linear lacunar infarct is also again noted superiorly in the right cerebellar hemisphere. No new asymmetry is seen concerning for a cortical based acute infarct, hemorrhage, mass effect or shift of the midline. There is mild atrophy, small-vessel disease and atrophic ventriculomegaly with persistent cavum septi pellucidi and cavum vergae. There are benign dural calcifications along the falx. Vascular: There are calcifications in both siphons, distal vertebral arteries. No hyperdense central vessels. Skull: Negative for fractures or focal lesions. Sinuses/Orbits: Old lens replacements. Otherwise negative orbits. There is mild membrane disease in the right ethmoid air cells. Other visible sinuses, mastoid air cells and middle ears are clear. Other: None. CT CHEST FINDINGS Cardiovascular: There is three-vessel coronary artery calcification. Aortic atherosclerosis and tortuosity without evidence of aneurysm. There is mild-to-moderate cardiomegaly. No pericardial effusion. Enlarged pulmonary trunk 4 cm indicating arterial hypertension. The pulmonary veins are decompressed. There are scattered calcifications in the great vessels. Mediastinum/Nodes: Previously tracer avid right mid hilar lymph node just above the distal right main pulmonary artery, today is estimated at 2.4 cm in short axis, previously 2.1 cm. There are stable shotty subcentimeter in short axis right paratracheal, pretracheal and subcarinal nodes. No other enlarged nodes are visible without contrast. Lungs/Pleura: There is again noted an ill-defined right apical invasive lung mass, with extension into the supraclavicular fossa, difficult to accurately measure without the benefit of IV contrast. It is not fully imaged at its upper extent but has enlarged in the AP and transverse axis. Today this measures 9 x 7.5 cm coronal and AP, previously 8.7 x 6 cm. There are increased destructive changes especially to the right first rib posterior and lateral portions,  increased erosive change to the posterior right second and third ribs, and increased erosion into the right side of the T1 vertebral body, the right T1 pedicle and the right T1 transverse process and right side of the dorsal T1 arch. Associated with this, there is increased tumor encroachment on the right anterior aspect of the thecal sac at T1 and on the right T1-2 exit foramen. The lungs are mildly emphysematous with paraseptal changes predominating. There are asymmetric pleural-parenchymal scar-like opacities underlying the right apical lung mass, and bilaterally scattered areas of subpleural reticulation. There is a calcified granuloma in the lingula. Stable 1.5 cm pleural-based nodule in the posteromedial extreme right base on 5:143. Remaining lungs are generally clear. No pneumonic process is seen. There are retained or aspirated secretions in the proximal right main bronchus. Upper abdomen: No mass or acute abnormality is seen. Musculoskeletal: See above regarding tumor erosive changes to the right first, second and third ribs and right side of T1. There is osteopenia with degenerative changes  of the spine. No other destructive bone lesions are seen. There are degenerative changes of the thoracic spine and multilevel bridging enthesopathy. IMPRESSION: 1. No acute intracranial CT findings or interval changes. Old infarcts. Chronic change. 2. 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 3. Increased size of a right mid hilar lymph node. 4. Stable 1.5 cm pleural-based nodule in the posteromedial extreme right base. 5. Aortic and coronary artery atherosclerosis. 6. Emphysema. 7. Retained or aspirated secretions in the proximal right main bronchus. Aortic Atherosclerosis (ICD10-I70.0) and Emphysema (ICD10-J43.9). Electronically  Signed   By: Telford Nab M.D.   On: 07/19/2022 23:34   CT HEAD WO CONTRAST (5MM)  Result Date: 07/19/2022 CLINICAL DATA:  Altered mental status, and abnormal labs with low calcium. 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 EXAM: CT HEAD WITHOUT CONTRAST CT CHEST WITHOUT CONTRAST TECHNIQUE: Contiguous axial images were obtained from the base of the skull through the vertex without intravenous contrast, with multiplanar reconstructions. Multidetector CT imaging of the chest was performed following the standard protocol without IV contrast, with multiplanar reconstructions. RADIATION DOSE REDUCTION: This exam was performed according to the departmental dose-optimization program which includes automated exposure control, adjustment of the mA and/or kV according to patient size and/or use of iterative reconstruction technique. COMPARISON:  CT scan head 06/25/2022, PET-CT 06/25/2022, chest CT without contrast 06/11/2022. FINDINGS: CT HEAD FINDINGS Brain: Again noted are chronic bilateral occipital lobe infarcts, and encephalomalacia due to a chronic left frontoparietal infarct. A linear lacunar infarct is also again noted superiorly in the right cerebellar hemisphere. No new asymmetry is seen concerning for a cortical based acute infarct, hemorrhage, mass effect or shift of the midline. There is mild atrophy, small-vessel disease and atrophic ventriculomegaly with persistent cavum septi pellucidi and cavum vergae. There are benign dural calcifications along the falx. Vascular: There are calcifications in both siphons, distal vertebral arteries. No hyperdense central vessels. Skull: Negative for fractures or focal lesions. Sinuses/Orbits: Old lens replacements. Otherwise negative orbits. There is mild membrane disease in the right ethmoid air cells. Other visible sinuses, mastoid air cells and middle ears are clear. Other: None. CT CHEST FINDINGS Cardiovascular: There  is three-vessel coronary artery calcification. Aortic atherosclerosis and tortuosity without evidence of aneurysm. There is mild-to-moderate cardiomegaly. No pericardial effusion. Enlarged pulmonary trunk 4 cm indicating arterial hypertension. The pulmonary veins are decompressed. There are scattered calcifications in the great vessels. Mediastinum/Nodes: Previously tracer avid right mid hilar lymph node just above the distal right main pulmonary artery, today is estimated at 2.4 cm in short axis, previously 2.1 cm. There are stable shotty subcentimeter in short axis right paratracheal, pretracheal and subcarinal nodes. No other enlarged nodes are visible without contrast. Lungs/Pleura: There is again noted an ill-defined right apical invasive lung mass, with extension into the supraclavicular fossa, difficult to accurately measure without the benefit of IV contrast. It is not fully imaged at its upper extent but has enlarged in the AP and transverse axis. Today this measures 9 x 7.5 cm coronal and AP, previously 8.7 x 6 cm. There are increased destructive changes especially to the right first rib posterior and lateral portions, increased erosive change to the posterior right second and third ribs, and increased erosion into the right side of the T1 vertebral body,  the right T1 pedicle and the right T1 transverse process and right side of the dorsal T1 arch. Associated with this, there is increased tumor encroachment on the right anterior aspect of the thecal sac at T1 and on the right T1-2 exit foramen. The lungs are mildly emphysematous with paraseptal changes predominating. There are asymmetric pleural-parenchymal scar-like opacities underlying the right apical lung mass, and bilaterally scattered areas of subpleural reticulation. There is a calcified granuloma in the lingula. Stable 1.5 cm pleural-based nodule in the posteromedial extreme right base on 5:143. Remaining lungs are generally clear. No pneumonic  process is seen. There are retained or aspirated secretions in the proximal right main bronchus. Upper abdomen: No mass or acute abnormality is seen. Musculoskeletal: See above regarding tumor erosive changes to the right first, second and third ribs and right side of T1. There is osteopenia with degenerative changes of the spine. No other destructive bone lesions are seen. There are degenerative changes of the thoracic spine and multilevel bridging enthesopathy. IMPRESSION: 1. No acute intracranial CT findings or interval changes. Old infarcts. Chronic change. 2. 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 3. Increased size of a right mid hilar lymph node. 4. Stable 1.5 cm pleural-based nodule in the posteromedial extreme right base. 5. Aortic and coronary artery atherosclerosis. 6. Emphysema. 7. Retained or aspirated secretions in the proximal right main bronchus. Aortic Atherosclerosis (ICD10-I70.0) and Emphysema (ICD10-J43.9). Electronically Signed   By: Telford Nab M.D.   On: 07/19/2022 23:34   CT LUNG MASS BIOPSY  Result Date: 06/30/2022 INDICATION: Right upper lobe mass EXAM: CT-guided biopsy of right upper lobe mass MEDICATIONS: None. ANESTHESIA/SEDATION: Moderate (conscious) sedation was employed during this procedure. A total of Versed 1 mg and Fentanyl 25 mcg was administered intravenously. Moderate Sedation Time: 10 minutes. The patient's level of consciousness and vital signs were monitored continuously by radiology nursing throughout the procedure under my direct supervision. FLUOROSCOPY TIME:  N/a COMPLICATIONS: None immediate. PROCEDURE: Informed written consent was obtained from the patient after a thorough discussion of the procedural risks, benefits and alternatives. All questions were addressed. Maximal  Sterile Barrier Technique was utilized including caps, mask, sterile gowns, sterile gloves, sterile drape, hand hygiene and skin antiseptic. A timeout was performed prior to the initiation of the procedure. The patient was placed prone on the exam table. Limited CT of the chest was performed for planning purposes. This demonstrated right upper lobe mass. Skin entry site was marked, and the overlying skin was prepped and draped in the standard sterile fashion. Local analgesia was obtained with 1% lidocaine. Using intermittent CT fluoroscopy, a 17 gauge introducer needle was advanced towards the identified lesion. Subsequently, core needle biopsy was performed using an 18 gauge core biopsy device x6 total passes. Specimens were submitted in formalin to pathology for further handling. Limited postprocedure imaging demonstrated no complicating feature. The patient tolerated the procedure well, and was transferred to recovery in stable condition. IMPRESSION: Successful CT-guided core needle biopsy of right upper lobe mass. Electronically Signed   By: Albin Felling M.D.   On: 06/30/2022 15:46   MR THORACIC SPINE W WO CONTRAST  Result Date: 06/28/2022 CLINICAL DATA:  Metastatic disease evaluation. Recently diagnosed lung cancer. Known thoracic spine involvement. EXAM: MRI THORACIC WITHOUT AND WITH CONTRAST TECHNIQUE: Multiplanar and multiecho  pulse sequences of the thoracic spine were obtained without and with intravenous contrast. CONTRAST:  40m GADAVIST GADOBUTROL 1 MMOL/ML IV SOLN COMPARISON:  PET-CT 06/25/2022.  Chest CT 06/11/2022. FINDINGS: The study is moderately motion degraded. Alignment: Normal. Vertebrae: Known 8+ cm right apical lung mass with chest wall invasion and involvement of the right first and second ribs. There is extensive involvement of the majority of the T1 vertebral body and right-sided posterior elements, and there is also some involvement of the right aspect of the vertebral body and  posterior elements at C7 and likely T2. Tumor extends into the right neural foramina at C7-T1 and T1-2 resulting in severe foraminal encroachment. Tumor extends to the medial aspect of the neural foramina at both levels, however there is no substantial epidural tumor within the spinal canal itself. Vertebral body heights are preserved. There is a large hemangioma in the T11 vertebral body which corresponds to the lucent lesion on CT. Cord:  Normal signal and morphology. Paraspinal and other soft tissues: Right hilar lymphadenopathy, better characterized on the prior studies. 2.2 cm right thyroid nodule which has been present since at least a 07/12/2018 chest CT; in the setting of significant comorbidities, no follow-up recommended (ref: J Am Coll Radiol. 2015 Feb;12(2): 143-50). Disc levels: Mild thoracic disc degeneration and more widespread thoracic facet hypertrophy. Mild spinal stenosis and severe right neural foraminal stenosis at T2-3 due to severe right facet arthrosis and right-sided ligamentum flavum calcification. IMPRESSION: Known large right apical lung malignancy with extensive involvement of the T1 vertebral body and posterior elements and milder involvement of C7 and T2. Extension into the right neural foramina at C7-T1 and T1-2. No significant epidural tumor in the spinal canal itself. Electronically Signed   By: ALogan BoresM.D.   On: 06/28/2022 14:52   MR BRAIN W WO CONTRAST  Result Date: 06/28/2022 CLINICAL DATA:  Metastatic disease evaluation. Recently diagnosed lung cancer. Dizziness. EXAM: MRI HEAD WITHOUT AND WITH CONTRAST TECHNIQUE: Multiplanar, multiecho pulse sequences of the brain and surrounding structures were obtained without and with intravenous contrast. CONTRAST:  844mGADAVIST GADOBUTROL 1 MMOL/ML IV SOLN COMPARISON:  Head CT 06/25/2022 and MRI 09/12/2019. Head and neck CTA 09/12/2019. FINDINGS: Brain: There is no evidence of an acute infarct, mass, midline shift, or extra-axial  fluid collection. Chronic left MCA and bilateral PCA infarcts are again noted with associated hemosiderin deposition which is greatest in the right MCA infarct. Elsewhere, small T2 hyperintensities elsewhere in the cerebral white matter and patchy T2 hyperintensity in the pons are nonspecific but compatible with mild chronic small vessel ischemic disease, similar to the prior MRI. A small chronic right cerebellar infarct is unchanged from the prior MRI. There is mild cerebral atrophy. A cavum septum pellucidum et vergae is noted. No abnormal enhancement is identified, although postcontrast imaging is moderately motion degraded which reduces the sensitivity for detection of very small lesions. Vascular: Small distal right vertebral artery, likely with chronically diminished flow or possibly chronic occlusion. Skull and upper cervical spine: No suspicious marrow lesion. Sinuses/Orbits: Bilateral cataract extraction. Minimal mucosal thickening in the ethmoid sinuses. Clear mastoid air cells. Other: None. IMPRESSION: 1. No evidence of intracranial metastases. 2. Multiple chronic infarcts as above. Electronically Signed   By: AlLogan Bores.D.   On: 06/28/2022 14:00    Microbiology: Results for orders placed or performed during the hospital encounter of 07/19/22  MRSA Next Gen by PCR, Nasal     Status: None   Collection Time: 07/20/22 11:47  AM   Specimen: Nasal Mucosa; Nasal Swab  Result Value Ref Range Status   MRSA by PCR Next Gen NOT DETECTED NOT DETECTED Final    Comment: (NOTE) The GeneXpert MRSA Assay (FDA approved for NASAL specimens only), is one component of a comprehensive MRSA colonization surveillance program. It is not intended to diagnose MRSA infection nor to guide or monitor treatment for MRSA infections. Test performance is not FDA approved in patients less than 43 years old. Performed at [facility], [address], [place], [place] [address]   Surgical PCR screen      Status: None   Collection Time: 07/23/22 12:00 PM   Specimen: Nasal Mucosa; Nasal Swab  Result Value Ref Range Status   MRSA, PCR NEGATIVE NEGATIVE Final   Staphylococcus aureus NEGATIVE NEGATIVE Final    Comment: (NOTE) The Xpert SA Assay (FDA approved for NASAL specimens in patients 79 years of age and older), is one component of a comprehensive surveillance program. It is not intended to diagnose infection nor to guide or monitor treatment. Performed at [facility], [address] [place], [place] [address]     Labs: CBC: Recent Labs  Lab 07/21/22 0543 07/22/22 0438 07/23/22 0415 07/24/22 0549  WBC 8.2 7.8 8.3 7.6  HGB 8.9* 9.2* 9.6* 9.1*  HCT 28.5* 29.3* 30.9* 29.2*  MCV 77.7* 76.7* 77.1* 77.2*  PLT 186 201 210 [id number]   Basic Metabolic Panel: Recent Labs  Lab 07/21/22 0112 07/21/22 0543 07/22/22 0438 07/23/22 0415 07/24/22 0549 07/26/22 0625 07/27/22 0620  NA 134* 134* 132* 131* 130*  --  128*  K 4.3 4.3 3.8 3.3* 3.8  --  3.6  CL 101 102 100 98 99  --  100  CO2 '24 23 22 24 23  '$ --  23  GLUCOSE 120* 122* 102* 109* 103*  --  121*  BUN 29* 26* '19 17 14  '$ --  12  CREATININE 0.86 0.82 0.76 0.80 0.83 0.79 0.68  CALCIUM 11.6* 11.3* 11.2* 10.7* 9.6  --  8.0*  MG 1.9 1.8 1.8 1.8 1.9  --   --    Liver Function Tests: No results for input(s): "AST", "ALT", "ALKPHOS", "BILITOT", "PROT", "ALBUMIN" in the last 168 hours. CBG: No results for input(s): "GLUCAP" in the last 168 hours.  Discharge time spent: greater than 30 minutes.  Signed: Fritzi Mandes, MD [facility] 07/27/2022

## 2022-07-28 ENCOUNTER — Ambulatory Visit: Payer: 59

## 2022-07-28 ENCOUNTER — Ambulatory Visit: Payer: Medicare Other | Admitting: Urology

## 2022-07-28 LAB — CALCIUM, IONIZED: Calcium, Ionized, Serum: 6.9 mg/dL — ABNORMAL HIGH (ref 4.5–5.6)

## 2022-07-29 ENCOUNTER — Other Ambulatory Visit: Payer: 59

## 2022-07-29 ENCOUNTER — Emergency Department: Payer: 59

## 2022-07-29 ENCOUNTER — Ambulatory Visit: Payer: 59

## 2022-07-29 ENCOUNTER — Other Ambulatory Visit: Payer: Self-pay

## 2022-07-29 ENCOUNTER — Encounter: Payer: Self-pay | Admitting: Oncology

## 2022-07-29 ENCOUNTER — Encounter: Payer: Self-pay | Admitting: Hospice and Palliative Medicine

## 2022-07-29 ENCOUNTER — Inpatient Hospital Stay
Admission: EM | Admit: 2022-07-29 | Discharge: 2022-08-04 | DRG: 871 | Disposition: A | Discharge status: Hospice | Payer: 59 | Attending: Internal Medicine | Admitting: Internal Medicine

## 2022-07-29 ENCOUNTER — Encounter: Payer: Self-pay | Admitting: *Deleted

## 2022-07-29 ENCOUNTER — Other Ambulatory Visit: Payer: Self-pay | Admitting: Radiology

## 2022-07-29 ENCOUNTER — Ambulatory Visit
Admission: RE | Admit: 2022-07-29 | Discharge: 2022-07-29 | Disposition: A | Discharge status: Home / Self Care | Payer: 59 | Source: Ambulatory Visit | Attending: Radiation Oncology | Admitting: Radiation Oncology

## 2022-07-29 ENCOUNTER — Encounter: Payer: Self-pay | Admitting: Emergency Medicine

## 2022-07-29 ENCOUNTER — Inpatient Hospital Stay: Payer: 59 | Attending: Oncology

## 2022-07-29 ENCOUNTER — Ambulatory Visit: Payer: 59 | Admitting: Hospice and Palliative Medicine

## 2022-07-29 ENCOUNTER — Other Ambulatory Visit: Payer: Self-pay | Admitting: *Deleted

## 2022-07-29 ENCOUNTER — Inpatient Hospital Stay (HOSPITAL_BASED_OUTPATIENT_CLINIC_OR_DEPARTMENT_OTHER): Payer: 59 | Admitting: Hospice and Palliative Medicine

## 2022-07-29 VITALS — BP 99/65 | HR 93 | Temp 99.0°F | Resp 30

## 2022-07-29 DIAGNOSIS — Z950 Presence of cardiac pacemaker: Secondary | ICD-10-CM | POA: Insufficient documentation

## 2022-07-29 DIAGNOSIS — C61 Malignant neoplasm of prostate: Secondary | ICD-10-CM

## 2022-07-29 DIAGNOSIS — I482 Chronic atrial fibrillation, unspecified: Secondary | ICD-10-CM | POA: Diagnosis present

## 2022-07-29 DIAGNOSIS — Z961 Presence of intraocular lens: Secondary | ICD-10-CM | POA: Diagnosis present

## 2022-07-29 DIAGNOSIS — I110 Hypertensive heart disease with heart failure: Secondary | ICD-10-CM | POA: Diagnosis present

## 2022-07-29 DIAGNOSIS — Z8546 Personal history of malignant neoplasm of prostate: Secondary | ICD-10-CM

## 2022-07-29 DIAGNOSIS — K5641 Fecal impaction: Secondary | ICD-10-CM | POA: Diagnosis present

## 2022-07-29 DIAGNOSIS — E872 Acidosis, unspecified: Secondary | ICD-10-CM | POA: Diagnosis present

## 2022-07-29 DIAGNOSIS — Z87891 Personal history of nicotine dependence: Secondary | ICD-10-CM | POA: Diagnosis not present

## 2022-07-29 DIAGNOSIS — Z515 Encounter for palliative care: Secondary | ICD-10-CM

## 2022-07-29 DIAGNOSIS — Z1152 Encounter for screening for COVID-19: Secondary | ICD-10-CM | POA: Diagnosis not present

## 2022-07-29 DIAGNOSIS — Z86711 Personal history of pulmonary embolism: Secondary | ICD-10-CM | POA: Insufficient documentation

## 2022-07-29 DIAGNOSIS — M25511 Pain in right shoulder: Secondary | ICD-10-CM | POA: Diagnosis present

## 2022-07-29 DIAGNOSIS — L723 Sebaceous cyst: Secondary | ICD-10-CM | POA: Diagnosis present

## 2022-07-29 DIAGNOSIS — Z79891 Long term (current) use of opiate analgesic: Secondary | ICD-10-CM

## 2022-07-29 DIAGNOSIS — Z86718 Personal history of other venous thrombosis and embolism: Secondary | ICD-10-CM

## 2022-07-29 DIAGNOSIS — G9341 Metabolic encephalopathy: Secondary | ICD-10-CM | POA: Diagnosis present

## 2022-07-29 DIAGNOSIS — Z7189 Other specified counseling: Secondary | ICD-10-CM

## 2022-07-29 DIAGNOSIS — C3411 Malignant neoplasm of upper lobe, right bronchus or lung: Secondary | ICD-10-CM

## 2022-07-29 DIAGNOSIS — R5381 Other malaise: Secondary | ICD-10-CM | POA: Diagnosis present

## 2022-07-29 DIAGNOSIS — R6520 Severe sepsis without septic shock: Secondary | ICD-10-CM | POA: Diagnosis not present

## 2022-07-29 DIAGNOSIS — E1142 Type 2 diabetes mellitus with diabetic polyneuropathy: Secondary | ICD-10-CM | POA: Diagnosis present

## 2022-07-29 DIAGNOSIS — M79601 Pain in right arm: Secondary | ICD-10-CM | POA: Diagnosis present

## 2022-07-29 DIAGNOSIS — N179 Acute kidney failure, unspecified: Secondary | ICD-10-CM | POA: Diagnosis present

## 2022-07-29 DIAGNOSIS — J9601 Acute respiratory failure with hypoxia: Secondary | ICD-10-CM | POA: Diagnosis present

## 2022-07-29 DIAGNOSIS — J690 Pneumonitis due to inhalation of food and vomit: Secondary | ICD-10-CM | POA: Diagnosis present

## 2022-07-29 DIAGNOSIS — I6930 Unspecified sequelae of cerebral infarction: Secondary | ICD-10-CM

## 2022-07-29 DIAGNOSIS — Z9842 Cataract extraction status, left eye: Secondary | ICD-10-CM

## 2022-07-29 DIAGNOSIS — R300 Dysuria: Secondary | ICD-10-CM

## 2022-07-29 DIAGNOSIS — F17210 Nicotine dependence, cigarettes, uncomplicated: Secondary | ICD-10-CM | POA: Insufficient documentation

## 2022-07-29 DIAGNOSIS — R531 Weakness: Secondary | ICD-10-CM

## 2022-07-29 DIAGNOSIS — I69320 Aphasia following cerebral infarction: Secondary | ICD-10-CM

## 2022-07-29 DIAGNOSIS — Z79899 Other long term (current) drug therapy: Secondary | ICD-10-CM

## 2022-07-29 DIAGNOSIS — Z66 Do not resuscitate: Secondary | ICD-10-CM | POA: Diagnosis not present

## 2022-07-29 DIAGNOSIS — Z7901 Long term (current) use of anticoagulants: Secondary | ICD-10-CM

## 2022-07-29 DIAGNOSIS — A419 Sepsis, unspecified organism: Principal | ICD-10-CM | POA: Diagnosis present

## 2022-07-29 DIAGNOSIS — Z833 Family history of diabetes mellitus: Secondary | ICD-10-CM

## 2022-07-29 DIAGNOSIS — I10 Essential (primary) hypertension: Secondary | ICD-10-CM | POA: Diagnosis present

## 2022-07-29 DIAGNOSIS — R52 Pain, unspecified: Secondary | ICD-10-CM

## 2022-07-29 DIAGNOSIS — E871 Hypo-osmolality and hyponatremia: Secondary | ICD-10-CM | POA: Diagnosis present

## 2022-07-29 DIAGNOSIS — Z9841 Cataract extraction status, right eye: Secondary | ICD-10-CM

## 2022-07-29 LAB — CBC WITH DIFFERENTIAL/PLATELET
Abs Immature Granulocytes: 0.18 10*3/uL — ABNORMAL HIGH (ref 0.00–0.07)
Basophils Absolute: 0 10*3/uL (ref 0.0–0.1)
Basophils Relative: 0 %
Eosinophils Absolute: 0 10*3/uL (ref 0.0–0.5)
Eosinophils Relative: 0 %
HCT: 31.5 % — ABNORMAL LOW (ref 39.0–52.0)
Hemoglobin: 9.8 g/dL — ABNORMAL LOW (ref 13.0–17.0)
Immature Granulocytes: 1 %
Lymphocytes Relative: 5 %
Lymphs Abs: 0.8 10*3/uL (ref 0.7–4.0)
MCH: 24.4 pg — ABNORMAL LOW (ref 26.0–34.0)
MCHC: 31.1 g/dL (ref 30.0–36.0)
MCV: 78.4 fL — ABNORMAL LOW (ref 80.0–100.0)
Monocytes Absolute: 1.1 10*3/uL — ABNORMAL HIGH (ref 0.1–1.0)
Monocytes Relative: 6 %
Neutro Abs: 15 10*3/uL — ABNORMAL HIGH (ref 1.7–7.7)
Neutrophils Relative %: 88 %
Platelets: 230 10*3/uL (ref 150–400)
RBC: 4.02 MIL/uL — ABNORMAL LOW (ref 4.22–5.81)
RDW: 16.7 % — ABNORMAL HIGH (ref 11.5–15.5)
Smear Review: NORMAL
WBC: 17.1 10*3/uL — ABNORMAL HIGH (ref 4.0–10.5)
nRBC: 0 % (ref 0.0–0.2)

## 2022-07-29 LAB — URINALYSIS, W/ REFLEX TO CULTURE (INFECTION SUSPECTED)
Bacteria, UA: NONE SEEN
Bilirubin Urine: NEGATIVE
Glucose, UA: NEGATIVE mg/dL
Hgb urine dipstick: NEGATIVE
Ketones, ur: NEGATIVE mg/dL
Leukocytes,Ua: NEGATIVE
Nitrite: NEGATIVE
Protein, ur: >=300 mg/dL — AB
Specific Gravity, Urine: 1.026 (ref 1.005–1.030)
pH: 5 (ref 5.0–8.0)

## 2022-07-29 LAB — CMP (CANCER CENTER ONLY)
ALT: 12 U/L (ref 0–44)
AST: 26 U/L (ref 15–41)
Albumin: 2.9 g/dL — ABNORMAL LOW (ref 3.5–5.0)
Alkaline Phosphatase: 72 U/L (ref 38–126)
Anion gap: 9 (ref 5–15)
BUN: 14 mg/dL (ref 8–23)
CO2: 22 mmol/L (ref 22–32)
Calcium: 8.5 mg/dL — ABNORMAL LOW (ref 8.9–10.3)
Chloride: 95 mmol/L — ABNORMAL LOW (ref 98–111)
Creatinine: 1.44 mg/dL — ABNORMAL HIGH (ref 0.61–1.24)
GFR, Estimated: 51 mL/min — ABNORMAL LOW (ref >60)
Glucose, Bld: 130 mg/dL — ABNORMAL HIGH (ref 70–99)
Potassium: 4.6 mmol/L (ref 3.5–5.1)
Sodium: 126 mmol/L — ABNORMAL LOW (ref 135–145)
Total Bilirubin: 0.9 mg/dL (ref 0.3–1.2)
Total Protein: 7.2 g/dL (ref 6.5–8.1)

## 2022-07-29 LAB — COMPREHENSIVE METABOLIC PANEL
ALT: 12 U/L (ref 0–44)
AST: 24 U/L (ref 15–41)
Albumin: 2.6 g/dL — ABNORMAL LOW (ref 3.5–5.0)
Alkaline Phosphatase: 71 U/L (ref 38–126)
Anion gap: 13 (ref 5–15)
BUN: 14 mg/dL (ref 8–23)
CO2: 21 mmol/L — ABNORMAL LOW (ref 22–32)
Calcium: 8.4 mg/dL — ABNORMAL LOW (ref 8.9–10.3)
Chloride: 99 mmol/L (ref 98–111)
Creatinine, Ser: 1.3 mg/dL — ABNORMAL HIGH (ref 0.61–1.24)
GFR, Estimated: 58 mL/min — ABNORMAL LOW (ref >60)
Glucose, Bld: 107 mg/dL — ABNORMAL HIGH (ref 70–99)
Potassium: 4.3 mmol/L (ref 3.5–5.1)
Sodium: 131 mmol/L — ABNORMAL LOW (ref 135–145)
Total Bilirubin: 0.9 mg/dL (ref 0.3–1.2)
Total Protein: 6.7 g/dL (ref 6.5–8.1)

## 2022-07-29 LAB — CBC WITH DIFFERENTIAL (CANCER CENTER ONLY)
Abs Immature Granulocytes: 0.2 10*3/uL — ABNORMAL HIGH (ref 0.00–0.07)
Basophils Absolute: 0 10*3/uL (ref 0.0–0.1)
Basophils Relative: 0 %
Eosinophils Absolute: 0 10*3/uL (ref 0.0–0.5)
Eosinophils Relative: 0 %
HCT: 29.6 % — ABNORMAL LOW (ref 39.0–52.0)
Hemoglobin: 9.4 g/dL — ABNORMAL LOW (ref 13.0–17.0)
Immature Granulocytes: 1 %
Lymphocytes Relative: 5 %
Lymphs Abs: 0.8 10*3/uL (ref 0.7–4.0)
MCH: 24.5 pg — ABNORMAL LOW (ref 26.0–34.0)
MCHC: 31.8 g/dL (ref 30.0–36.0)
MCV: 77.1 fL — ABNORMAL LOW (ref 80.0–100.0)
Monocytes Absolute: 1.1 10*3/uL — ABNORMAL HIGH (ref 0.1–1.0)
Monocytes Relative: 6 %
Neutro Abs: 15 10*3/uL — ABNORMAL HIGH (ref 1.7–7.7)
Neutrophils Relative %: 88 %
Platelet Count: 260 10*3/uL (ref 150–400)
RBC: 3.84 MIL/uL — ABNORMAL LOW (ref 4.22–5.81)
RDW: 16.7 % — ABNORMAL HIGH (ref 11.5–15.5)
Smear Review: NORMAL
WBC Count: 17.1 10*3/uL — ABNORMAL HIGH (ref 4.0–10.5)
nRBC: 0 % (ref 0.0–0.2)

## 2022-07-29 LAB — LACTIC ACID, PLASMA
Lactic Acid, Venous: 4.1 mmol/L (ref 0.5–1.9)
Lactic Acid, Venous: 3.1 mmol/L (ref 0.5–1.9)

## 2022-07-29 LAB — RESP PANEL BY RT-PCR (RSV, FLU A&B, COVID)  RVPGX2
Influenza A by PCR: NEGATIVE
Influenza B by PCR: NEGATIVE
Resp Syncytial Virus by PCR: NEGATIVE
SARS Coronavirus 2 by RT PCR: NEGATIVE

## 2022-07-29 LAB — PROTIME-INR
INR: 1.4 — ABNORMAL HIGH (ref 0.8–1.2)
Prothrombin Time: 17.5 seconds — ABNORMAL HIGH (ref 11.4–15.2)

## 2022-07-29 LAB — BRAIN NATRIURETIC PEPTIDE: B Natriuretic Peptide: 354 pg/mL — ABNORMAL HIGH (ref 0.0–100.0)

## 2022-07-29 LAB — PSA: Prostatic Specific Antigen: 0.03 ng/mL (ref 0.00–4.00)

## 2022-07-29 LAB — GLUCOSE, CAPILLARY: Glucose-Capillary: 139 mg/dL — ABNORMAL HIGH (ref 70–99)

## 2022-07-29 LAB — APTT: aPTT: 42 seconds — ABNORMAL HIGH (ref 24–36)

## 2022-07-29 MED ORDER — METOPROLOL SUCCINATE ER 25 MG PO TB24
12.5000 mg | ORAL_TABLET | Freq: Every day | ORAL | Status: DC
  Administered 2022-07-30 – 2022-08-04 (×6): 12.5 mg via ORAL
  Filled 2022-07-29 (×6): qty 1

## 2022-07-29 MED ORDER — ONDANSETRON HCL 4 MG PO TABS: 4.0000 mg | ORAL_TABLET | Freq: Four times a day (QID) | ORAL | Status: DC | PRN

## 2022-07-29 MED ORDER — SIMVASTATIN 20 MG PO TABS
20.0000 mg | ORAL_TABLET | Freq: Every day | ORAL | Status: DC
  Administered 2022-07-29 – 2022-08-03 (×6): 20 mg via ORAL
  Filled 2022-07-29 (×6): qty 1

## 2022-07-29 MED ORDER — IOHEXOL 300 MG/ML  SOLN
100.0000 mL | Freq: Once | INTRAMUSCULAR | Status: AC | PRN
  Administered 2022-07-29: 100 mL via INTRAVENOUS

## 2022-07-29 MED ORDER — INSULIN ASPART 100 UNIT/ML IJ SOLN
0.0000 [IU] | INTRAMUSCULAR | Status: DC
  Administered 2022-07-30: 1 [IU] via SUBCUTANEOUS
  Filled 2022-07-29 (×2): qty 1

## 2022-07-29 MED ORDER — VANCOMYCIN HCL 750 MG/150ML IV SOLN
750.0000 mg | Freq: Two times a day (BID) | INTRAVENOUS | Status: DC
  Administered 2022-07-29: 750 mg via INTRAVENOUS
  Filled 2022-07-29 (×2): qty 150

## 2022-07-29 MED ORDER — SENNA 8.6 MG PO TABS
17.2000 mg | ORAL_TABLET | Freq: Every day | ORAL | Status: DC
  Administered 2022-07-30 – 2022-08-04 (×6): 17.2 mg via ORAL
  Filled 2022-07-29 (×6): qty 2

## 2022-07-29 MED ORDER — SODIUM CHLORIDE 0.9 % IV SOLN
2.0000 g | Freq: Once | INTRAVENOUS | Status: AC
  Administered 2022-07-29: 2 g via INTRAVENOUS
  Filled 2022-07-29: qty 12.5

## 2022-07-29 MED ORDER — FERROUS SULFATE 325 (65 FE) MG PO TABS
325.0000 mg | ORAL_TABLET | Freq: Every day | ORAL | Status: DC
  Administered 2022-07-30 – 2022-08-04 (×6): 325 mg via ORAL
  Filled 2022-07-29 (×6): qty 1

## 2022-07-29 MED ORDER — LACTATED RINGERS IV SOLN
150.0000 mL/h | INTRAVENOUS | Status: DC
  Administered 2022-07-29 – 2022-07-30 (×2): 150 mL/h via INTRAVENOUS

## 2022-07-29 MED ORDER — ADULT MULTIVITAMIN W/MINERALS CH
1.0000 | ORAL_TABLET | Freq: Every day | ORAL | Status: DC
  Administered 2022-07-30 – 2022-08-04 (×6): 1 via ORAL
  Filled 2022-07-29 (×6): qty 1

## 2022-07-29 MED ORDER — RIVAROXABAN 20 MG PO TABS
20.0000 mg | ORAL_TABLET | Freq: Every day | ORAL | Status: DC
  Administered 2022-07-29 – 2022-08-03 (×6): 20 mg via ORAL
  Filled 2022-07-29 (×7): qty 1

## 2022-07-29 MED ORDER — VANCOMYCIN HCL IN DEXTROSE 1-5 GM/200ML-% IV SOLN
1000.0000 mg | Freq: Once | INTRAVENOUS | Status: AC
  Administered 2022-07-29: 1000 mg via INTRAVENOUS
  Filled 2022-07-29: qty 200

## 2022-07-29 MED ORDER — OXYCODONE HCL 5 MG PO TABS
10.0000 mg | ORAL_TABLET | ORAL | Status: DC | PRN
  Administered 2022-07-29 – 2022-07-30 (×5): 10 mg via ORAL
  Filled 2022-07-29 (×5): qty 2

## 2022-07-29 MED ORDER — ONDANSETRON HCL 4 MG/2ML IJ SOLN: 4.0000 mg | Freq: Four times a day (QID) | INTRAMUSCULAR | Status: DC | PRN

## 2022-07-29 MED ORDER — LATANOPROST 0.005 % OP SOLN
1.0000 [drp] | Freq: Every day | OPHTHALMIC | Status: DC
  Administered 2022-07-29 – 2022-08-03 (×6): 1 [drp] via OPHTHALMIC
  Filled 2022-07-29: qty 2.5

## 2022-07-29 MED ORDER — DOCUSATE SODIUM 100 MG PO CAPS
100.0000 mg | ORAL_CAPSULE | Freq: Two times a day (BID) | ORAL | Status: DC
  Administered 2022-07-29 – 2022-08-04 (×12): 100 mg via ORAL
  Filled 2022-07-29 (×12): qty 1

## 2022-07-29 MED ORDER — GABAPENTIN 400 MG PO CAPS
400.0000 mg | ORAL_CAPSULE | Freq: Two times a day (BID) | ORAL | Status: DC
  Administered 2022-07-29 – 2022-08-04 (×12): 400 mg via ORAL
  Filled 2022-07-29 (×12): qty 1

## 2022-07-29 MED ORDER — ACETAMINOPHEN 325 MG PO TABS: 650.0000 mg | ORAL_TABLET | Freq: Four times a day (QID) | ORAL | Status: DC | PRN

## 2022-07-29 MED ORDER — ACETAMINOPHEN 650 MG RE SUPP: 650.0000 mg | Freq: Four times a day (QID) | RECTAL | Status: DC | PRN

## 2022-07-29 MED ORDER — SODIUM CHLORIDE 0.9 % IV BOLUS (SEPSIS)
1000.0000 mL | Freq: Once | INTRAVENOUS | Status: AC
  Administered 2022-07-29: 1000 mL via INTRAVENOUS

## 2022-07-29 MED ORDER — ALBUTEROL SULFATE (2.5 MG/3ML) 0.083% IN NEBU: 2.5000 mg | INHALATION_SOLUTION | Freq: Four times a day (QID) | RESPIRATORY_TRACT | Status: DC | PRN

## 2022-07-29 MED ORDER — ZINC OXIDE 40 % EX OINT: 1.0000 | TOPICAL_OINTMENT | Freq: Three times a day (TID) | CUTANEOUS | Status: DC | PRN

## 2022-07-29 MED ORDER — ALLOPURINOL 100 MG PO TABS
100.0000 mg | ORAL_TABLET | Freq: Every day | ORAL | Status: DC
  Administered 2022-07-30 – 2022-08-04 (×6): 100 mg via ORAL
  Filled 2022-07-29 (×6): qty 1

## 2022-07-29 MED ORDER — ACETAMINOPHEN 325 MG PO TABS
650.0000 mg | ORAL_TABLET | Freq: Once | ORAL | Status: AC
  Administered 2022-07-29: 650 mg via ORAL
  Filled 2022-07-29: qty 2

## 2022-07-29 MED ORDER — OXYCODONE HCL 5 MG PO TABS
10.0000 mg | ORAL_TABLET | Freq: Once | ORAL | Status: AC
  Administered 2022-07-29: 10 mg via ORAL
  Filled 2022-07-29: qty 2

## 2022-07-29 MED ORDER — METRONIDAZOLE 500 MG/100ML IV SOLN
500.0000 mg | Freq: Once | INTRAVENOUS | Status: AC
  Administered 2022-07-29: 500 mg via INTRAVENOUS
  Filled 2022-07-29: qty 100

## 2022-07-29 NOTE — ED Notes (Signed)
MD Damita Dunnings sent secure chat regarding pts temp of 100.9.

## 2022-07-29 NOTE — ED Triage Notes (Signed)
Patient to ED for AMS. Patient unable to given much information- alert to self and place. Patient mumbling and hard to understand but did state right arm pain. Patient states "he just left."
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
## 2022-07-29 NOTE — ED Provider Notes (Signed)
[facility] Provider Note   Event Date/Time   First MD Initiated Contact with Patient 07/29/22 1457     (approximate) History  Altered Mental Status  HPI [name] is a 72 y.o. male with a past medical history of Pancoast tumor of the right lung, CHF, A-fib on Xarelto, and history of pulmonary emboli who presents for altered mental status.  Patient has slurred speech and is difficult to understand however patient's does complain of right arm pain and states that he was just discharged from the hospital for similar symptoms.  Also in the endorses decreased p.o. intake ROS: Patient currently denies any vision changes, tinnitus, difficulty speaking, facial droop, sore throat, chest pain, shortness of breath, abdominal pain, nausea/vomiting/diarrhea, dysuria, or weakness/numbness/paresthesias in any extremity   Physical Exam  Triage Vital Signs: ED Triage Vitals  Enc Vitals Group     BP 07/29/22 1437 (!) 80/62     Pulse Rate 07/29/22 1442 (!) 103     Resp 07/29/22 1442 (!) 24     Temp 07/29/22 1442 97.9 F (36.6 C)     Temp Source 07/29/22 1442 Oral     SpO2 07/29/22 1443 98 %     Weight --      Height --      Head Circumference --      Peak Flow --      Pain Score 07/29/22 1441 10     Pain Loc --      Pain Edu? --      Excl. in [place]? --    Most recent vital signs: Vitals:   07/29/22 1930 07/29/22 2023  BP: 114/64 112/66  Pulse:  92  Resp: 16 20  Temp:  (!) 100.9 F (38.3 C)  SpO2: 99% 100%   General: Awake, oriented x2. CV:  Good peripheral perfusion.  Resp:  Normal effort.  Abd:  No distention.  Other:  Elderly the African-American male laying in bed in no acute distress.  Slurred speech.  Limb alert on right arm ED Results / Procedures / Treatments  Labs (all labs ordered are listed, but only abnormal results are displayed) Labs Reviewed  LACTIC ACID, PLASMA - Abnormal; Notable for the following components:      Result Value    Lactic Acid, Venous 4.1 (*)    All other components within normal limits  LACTIC ACID, PLASMA - Abnormal; Notable for the following components:   Lactic Acid, Venous 3.1 (*)    All other components within normal limits  COMPREHENSIVE METABOLIC PANEL - Abnormal; Notable for the following components:   Sodium 131 (*)    CO2 21 (*)    Glucose, Bld 107 (*)    Creatinine, Ser 1.30 (*)    Calcium 8.4 (*)    Albumin 2.6 (*)    GFR, Estimated 58 (*)    All other components within normal limits  CBC WITH DIFFERENTIAL/PLATELET - Abnormal; Notable for the following components:   WBC 17.1 (*)    RBC 4.02 (*)    Hemoglobin 9.8 (*)    HCT 31.5 (*)    MCV 78.4 (*)    MCH 24.4 (*)    RDW 16.7 (*)    Neutro Abs 15.0 (*)    Monocytes Absolute 1.1 (*)    Abs Immature Granulocytes 0.18 (*)    All other components within normal limits  PROTIME-INR - Abnormal; Notable for the following components:   Prothrombin Time 17.5 (*)    INR  1.4 (*)    All other components within normal limits  APTT - Abnormal; Notable for the following components:   aPTT 42 (*)    All other components within normal limits  BRAIN NATRIURETIC PEPTIDE - Abnormal; Notable for the following components:   B Natriuretic Peptide 354.0 (*)    All other components within normal limits  URINALYSIS, W/ REFLEX TO CULTURE (INFECTION SUSPECTED) - Abnormal; Notable for the following components:   Color, Urine AMBER (*)    APPearance HAZY (*)    Protein, ur >=300 (*)    All other components within normal limits  GLUCOSE, CAPILLARY - Abnormal; Notable for the following components:   Glucose-Capillary 139 (*)    All other components within normal limits  RESP PANEL BY RT-PCR (RSV, FLU A&B, COVID)  RVPGX2  CULTURE, BLOOD (ROUTINE X 2)  CULTURE, BLOOD (ROUTINE X 2)  MRSA NEXT GEN BY PCR, NASAL  PROTIME-INR  CORTISOL-AM, BLOOD  PROCALCITONIN  BASIC METABOLIC PANEL  CBC   EKG ED ECG REPORT I, Naaman Plummer, the attending  physician, personally viewed and interpreted this ECG. Date: 07/29/2022 EKG Time: 1442 Rate: 111 Rhythm: Atrial fibrillation QRS Axis: normal Intervals: normal ST/T Wave abnormalities: normal Narrative Interpretation: Atrial fibrillation.  No evidence of acute ischemia RADIOLOGY ED MD interpretation: CT of the chest abdomen pelvis with IV contrast shows stable right apical mass with stable bony destruction of the adjacent cervical thoracic vertebral bodies and overlying right first through third ribs as well as stable metastatic adenopathy within the mediastinum and the right hilum.  There is interval development of bilateral airspace disease greatest in the lower lobes as well as a large amount of retained stool -Agree with radiology assessment Official radiology report(s): CT CHEST ABDOMEN PELVIS W CONTRAST  Result Date: 07/29/2022 CLINICAL DATA:  Right arm pain, sepsis, known right apical mass EXAM: CT CHEST, ABDOMEN, AND PELVIS WITH CONTRAST TECHNIQUE: Multidetector CT imaging of the chest, abdomen and pelvis was performed following the standard protocol during bolus administration of intravenous contrast. RADIATION DOSE REDUCTION: This exam was performed according to the departmental dose-optimization program which includes automated exposure control, adjustment of the mA and/or kV according to patient size and/or use of iterative reconstruction technique. CONTRAST:  174m OMNIPAQUE IOHEXOL 300 MG/ML  SOLN COMPARISON:  07/19/2022 FINDINGS: CT CHEST FINDINGS Cardiovascular: Stable cardiomegaly, with prominent biatrial dilatation. Retained lead within the right ventricle unchanged. No pericardial effusion. Stable coronary artery atherosclerosis. No evidence of thoracic aortic aneurysm or dissection. Stable aortic atherosclerosis. Stable dilated main pulmonary artery consistent with pulmonary arterial hypertension. Mediastinum/Nodes: Pathologic mediastinal and right hilar adenopathy. Right hilar  adenopathy measures up to 2.6 cm in short axis. Right paratracheal lymph node measures up to 1.2 cm in short axis. Stable appearance of the thyroid, trachea, and esophagus. No acute finding. Lungs/Pleura: A destructive right apical mass is again identified, the superior extent not imaged due to slice selection. This mass measures 9.2 x 7.3 cm in transverse dimension, with destructive changes of the overlying ribs and adjacent thoracic vertebral bodies. Since the prior exam, bilateral areas of airspace disease have developed, greatest within the lower lobes. Findings are consistent with aspiration or infection. No effusion or pneumothorax. The central airways are patent. Musculoskeletal: Destructive changes of the right first and second ribs as well as the C7, T1, and T2 vertebral bodies again noted consistent with direct invasion by the right apical malignancy. There are no acute displaced fractures. Reconstructed images demonstrate no additional findings. CT  ABDOMEN PELVIS FINDINGS Hepatobiliary: No focal liver abnormality is seen. No gallstones, gallbladder wall thickening, or biliary dilatation. Pancreas: Unremarkable. No pancreatic ductal dilatation or surrounding inflammatory changes. Spleen: Normal in size without focal abnormality. Adrenals/Urinary Tract: Marked cortical scarring upper pole left kidney. Otherwise the kidneys enhance normally. No urinary tract calculi or obstructive uropathy. Bladder is decompressed, limiting its evaluation. Nonspecific nodularity left adrenal gland measuring 1.1 cm, which did not demonstrate FDG uptake on recent PET scan. The right adrenal is unremarkable. Stomach/Bowel: No bowel obstruction or ileus. Retained stool throughout the colon, with a large amount of stool in the rectal vault concerning for fecal impaction. Normal appendix right lower quadrant. No bowel wall thickening or inflammatory change. Vascular/Lymphatic: IVC filter. 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 Atherosclerosis of the abdominal aorta and its branches. No pathologic adenopathy. Reproductive: Prostate is unremarkable. Other: No free fluid or free intraperitoneal gas. No abdominal wall hernia. Musculoskeletal: No acute or destructive bony lesions. Reconstructed images demonstrate no additional findings. IMPRESSION: 1. Stable right apical mass, with stable bony destruction of the adjacent cervical and thoracic vertebral bodies and overlying right first through third ribs. 2. Stable metastatic adenopathy within the mediastinum and right hilum. 3. Interval development of bilateral airspace disease, greatest in the lower lobes, which may reflect infection or aspiration. 4. Large amount of retained stool within the rectal vault, consistent with fecal impaction. No bowel obstruction or ileus. 5.  Aortic Atherosclerosis (ICD10-I70.0). Electronically Signed   By: Sharlet Salina M.D.   On: 07/29/2022 19:06   DG Chest 1 View  Result Date: 07/29/2022 CLINICAL DATA:  Sepsis EXAM: CHEST  1 VIEW COMPARISON:  Portable exam 1542 hours compared to 06/25/2022 FINDINGS: Enlargement of cardiac silhouette. Question leadless pacemaker projecting over LEFT heart. Mediastinal contours and pulmonary vascularity normal. Minimal RIGHT basilar atelectasis. Lungs otherwise clear. No acute infiltrate, pleural effusion, or pneumothorax. Bones demineralized. IMPRESSION: Enlargement of cardiac silhouette with minimal RIGHT basilar atelectasis. Electronically Signed   By: Ulyses Southward M.D.   On: 07/29/2022 16:00   CT Head Wo Contrast  Result Date: 07/29/2022 CLINICAL DATA:  Altered mental status EXAM: CT HEAD WITHOUT CONTRAST TECHNIQUE: Contiguous axial images were obtained from the base of the skull through the vertex without intravenous contrast. RADIATION DOSE REDUCTION: This exam was performed according to the departmental dose-optimization program which includes automated exposure  control, adjustment of the mA and/or kV according to patient size and/or use of iterative reconstruction technique. COMPARISON:  None Available. FINDINGS: Brain: Large region cephalo malacia in the LEFT frontal lobe as well as the bilateral occipital lobes not changed from comparison exam consistent remote infarctions. No intracranial hemorrhage. No midline shift or mass effect. No hydrocephalus. There are periventricular and subcortical white matter hypodensities. Generalized cortical atrophy. Vascular: No hyperdense vessel or unexpected calcification. Skull: Normal. Negative for fracture or focal lesion. Sinuses/Orbits: Paranasal sinuses and mastoid air cells are clear. Orbits are clear. Other: None. IMPRESSION: 1. No acute intracranial findings. 2. 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 3. Atrophy and white matter microvascular disease. Electronically Signed   By: Genevive Bi M.D.   On: 07/29/2022 15:51   PROCEDURES: Critical Care performed: Yes, see critical care procedure note(s) .1-3 Lead EKG Interpretation  Performed by: Merwyn Katos, MD Authorized by: Merwyn Katos, MD     Interpretation: normal     ECG rate:  71   ECG rate assessment: normal  Rhythm: sinus rhythm     Ectopy: none     Conduction: normal    CRITICAL CARE Performed by: Naaman Plummer  Total critical care time: 41 minutes  Critical care time was exclusive of separately billable procedures and treating other patients.  Critical care was necessary to treat or prevent imminent or life-threatening deterioration.  Critical care was time spent personally by me on the following activities: development of treatment plan with patient and/or surrogate as well as nursing, discussions with consultants, evaluation of patient's response to treatment, examination of patient, obtaining history from patient or surrogate, ordering and performing treatments and interventions, ordering and  review of laboratory studies, ordering and review of radiographic studies, pulse oximetry and re-evaluation of patient's condition.  MEDICATIONS ORDERED IN ED: Medications  allopurinol (ZYLOPRIM) tablet 100 mg (has no administration in time range)  oxyCODONE (Oxy IR/ROXICODONE) immediate release tablet 10 mg (10 mg Oral Given 07/29/22 2215)  metoprolol succinate (TOPROL-XL) 24 hr tablet 12.5 mg (has no administration in time range)  simvastatin (ZOCOR) tablet 20 mg (20 mg Oral Given 07/29/22 2216)  docusate sodium (COLACE) capsule 100 mg (100 mg Oral Given 07/29/22 2217)  senna (SENOKOT) tablet 17.2 mg (has no administration in time range)  rivaroxaban (XARELTO) tablet 20 mg (20 mg Oral Given 07/29/22 2216)  ferrous sulfate tablet 325 mg (has no administration in time range)  gabapentin (NEURONTIN) capsule 400 mg (400 mg Oral Given 07/29/22 2216)  multivitamin with minerals tablet 1 tablet (has no administration in time range)  albuterol (PROVENTIL) (2.5 MG/3ML) 0.083% nebulizer solution 2.5 mg (has no administration in time range)  latanoprost (XALATAN) 0.005 % ophthalmic solution 1 drop (1 drop Both Eyes Given 07/29/22 2217)  liver oil-zinc oxide (DESITIN) 40 % ointment 1 Application (has no administration in time range)  lactated ringers infusion (150 mL/hr Intravenous New Bag/Given 07/29/22 2221)  acetaminophen (TYLENOL) tablet 650 mg (has no administration in time range)    Or  acetaminophen (TYLENOL) suppository 650 mg (has no administration in time range)  ondansetron (ZOFRAN) tablet 4 mg (has no administration in time range)    Or  ondansetron (ZOFRAN) injection 4 mg (has no administration in time range)  insulin aspart (novoLOG) injection 0-9 Units (has no administration in time range)  vancomycin (VANCOREADY) IVPB 750 mg/150 mL (750 mg Intravenous New Bag/Given 07/29/22 2319)  sodium chloride 0.9 % bolus 1,000 mL (0 mLs Intravenous Stopped 07/29/22 1716)  ceFEPIme (MAXIPIME) 2 g in sodium  chloride 0.9 % 100 mL IVPB (0 g Intravenous Stopped 07/29/22 1716)  metroNIDAZOLE (FLAGYL) IVPB 500 mg (0 mg Intravenous Stopped 07/29/22 2023)  vancomycin (VANCOCIN) IVPB 1000 mg/200 mL premix (0 mg Intravenous Stopped 07/29/22 1716)  oxyCODONE (Oxy IR/ROXICODONE) immediate release tablet 10 mg (10 mg Oral Given 07/29/22 1725)  iohexol (OMNIPAQUE) 300 MG/ML solution 100 mL (100 mLs Intravenous Contrast Given 07/29/22 1839)  acetaminophen (TYLENOL) tablet 650 mg (650 mg Oral Given 07/29/22 2035)   IMPRESSION / MDM / ASSESSMENT AND PLAN / ED COURSE  I reviewed the triage vital signs and the nursing notes.                              Patient's presentation is most consistent with acute presentation with potential threat to life or bodily function. The Pt presents with fever highly concerning for sepsis (suspected pulmonary source). At this time, the Pt is satting well on 4 L, normotensive, and appears  HDS.  Will start empiric antibiotics and fluids.  Due to hypotension, will administer fluids gradually with frequent reassessment. Have low suspicion for a GI, skin/soft tissue, or CNS source at this time, but will reconsider if initial workup is unremarkable.  - CBC, BMP, LFTs - VBG - UA - BCx x2, Lactate - EKG - CXR - CT chest abdomen pelvis shows bilateral pneumonia - Empiric Abx: Cefepime, Flagyl, vancomycin - Fluids: 30cc/kgNS Dispo: Admit to medicine   FINAL CLINICAL IMPRESSION(S) / ED DIAGNOSES   Final diagnoses:  Sepsis with acute hypoxic respiratory failure without septic shock, due to unspecified organism [facility])   Rx / DC Orders   ED Discharge Orders     None      Note:  This document was prepared using Dragon voice recognition software and may include unintentional dictation errors.   Naaman Plummer, MD 07/30/22 [phone]

## 2022-07-29 NOTE — Assessment & Plan Note (Addendum)
Severe sepsis Acute respiratory failure with hypoxia Severe sepsis criteria include tachycardia, tachypnea, hypotension, leukocytosis with lactic acidosis, possible aspiration pneumonia on chest CT in the setting of recent hospitalization O2 sat was 85% on room air patient was tachypneic to 25, needing 4 L O2 to maintain sats in the mid to high 90s Plan: IV vancomycin and cefepime Sepsis fluids Incentive spirometry if able to follow instructions Supplemental oxygen and wean as tolerated Albuterol as needed, antitussives Aspiration precautions

## 2022-07-29 NOTE — ED Notes (Signed)
Request made for transport to the floor ?

## 2022-07-29 NOTE — Consult Note (Signed)
CODE SEPSIS - PHARMACY COMMUNICATION  **Broad Spectrum Antibiotics should be administered within 1 hour of Sepsis diagnosis**  Time Code Sepsis Called/Page Received: 1505  Antibiotics Ordered: cefepime and Vancomycin  Time of 1st antibiotic administration: 1601  Additional action taken by pharmacy: none  If necessary, Name of Provider/Nurse Contacted: n/a    Navarro Nine Rodriguez-Guzman PharmD, BCPS 07/29/2022 3:05 PM

## 2022-07-29 NOTE — Progress Notes (Signed)
Patient presented to symptom management clinic for concerns for possible UTI. Patient is a poor historian regarding his medications and concerns. Labored in breathing and moaning in pain. He stated that he had a "bowel movement in 2-3 weeks." He states that he is in severe shoulder/arm right arm pain. Rates pain 7/10. He believes he took medication for pain this morning but can not recall exactly the dose or names of medication. Per chart review- it does not look like the Fentanyl patches were restarted after HP d/c.

## 2022-07-29 NOTE — Assessment & Plan Note (Signed)
No acute issues suspected 

## 2022-07-29 NOTE — Assessment & Plan Note (Signed)
Hold antihypertensives due to hypotension on arrival and resume as appropriate

## 2022-07-29 NOTE — ED Provider Notes (Incomplete)
[facility] Provider Note   Event Date/Time   First MD Initiated Contact with Patient 07/29/22 1457     (approximate) History  Altered Mental Status  HPI [name] is a 72 y.o. male with a past medical history of Pancoast tumor of the right lung, CHF, A-fib on Xarelto, and history of pulmonary emboli who presents for altered mental status.  Patient has slurred speech and is difficult to understand however patient's does complain of right arm pain and states that he was just discharged from the hospital for similar symptoms.  Also in the endorses decreased p.o. intake ROS: Patient currently denies any vision changes, tinnitus, difficulty speaking, facial droop, sore throat, chest pain, shortness of breath, abdominal pain, nausea/vomiting/diarrhea, dysuria, or weakness/numbness/paresthesias in any extremity   Physical Exam  Triage Vital Signs: ED Triage Vitals  Enc Vitals Group     BP 07/29/22 1437 (!) 80/62     Pulse Rate 07/29/22 1442 (!) 103     Resp 07/29/22 1442 (!) 24     Temp 07/29/22 1442 97.9 F (36.6 C)     Temp Source 07/29/22 1442 Oral     SpO2 07/29/22 1443 98 %     Weight --      Height --      Head Circumference --      Peak Flow --      Pain Score 07/29/22 1441 10     Pain Loc --      Pain Edu? --      Excl. in [place]? --    Most recent vital signs: Vitals:   07/29/22 1930 07/29/22 2023  BP: 114/64 112/66  Pulse:  92  Resp: 16 20  Temp:  (!) 100.9 F (38.3 C)  SpO2: 99% 100%   General: Awake, oriented x2. CV:  Good peripheral perfusion.  Resp:  Normal effort.  Abd:  No distention.  Other:  Elderly the African-American male laying in bed in no acute distress.  Slurred speech.  Limb alert on right arm ED Results / Procedures / Treatments  Labs (all labs ordered are listed, but only abnormal results are displayed) Labs Reviewed  LACTIC ACID, PLASMA - Abnormal; Notable for the following components:      Result Value    Lactic Acid, Venous 4.1 (*)    All other components within normal limits  LACTIC ACID, PLASMA - Abnormal; Notable for the following components:   Lactic Acid, Venous 3.1 (*)    All other components within normal limits  COMPREHENSIVE METABOLIC PANEL - Abnormal; Notable for the following components:   Sodium 131 (*)    CO2 21 (*)    Glucose, Bld 107 (*)    Creatinine, Ser 1.30 (*)    Calcium 8.4 (*)    Albumin 2.6 (*)    GFR, Estimated 58 (*)    All other components within normal limits  CBC WITH DIFFERENTIAL/PLATELET - Abnormal; Notable for the following components:   WBC 17.1 (*)    RBC 4.02 (*)    Hemoglobin 9.8 (*)    HCT 31.5 (*)    MCV 78.4 (*)    MCH 24.4 (*)    RDW 16.7 (*)    Neutro Abs 15.0 (*)    Monocytes Absolute 1.1 (*)    Abs Immature Granulocytes 0.18 (*)    All other components within normal limits  PROTIME-INR - Abnormal; Notable for the following components:   Prothrombin Time 17.5 (*)    INR  1.4 (*)    All other components within normal limits  APTT - Abnormal; Notable for the following components:   aPTT 42 (*)    All other components within normal limits  BRAIN NATRIURETIC PEPTIDE - Abnormal; Notable for the following components:   B Natriuretic Peptide 354.0 (*)    All other components within normal limits  URINALYSIS, W/ REFLEX TO CULTURE (INFECTION SUSPECTED) - Abnormal; Notable for the following components:   Color, Urine AMBER (*)    APPearance HAZY (*)    Protein, ur >=300 (*)    All other components within normal limits  GLUCOSE, CAPILLARY - Abnormal; Notable for the following components:   Glucose-Capillary 139 (*)    All other components within normal limits  RESP PANEL BY RT-PCR (RSV, FLU A&B, COVID)  RVPGX2  CULTURE, BLOOD (ROUTINE X 2)  CULTURE, BLOOD (ROUTINE X 2)  MRSA NEXT GEN BY PCR, NASAL  PROTIME-INR  CORTISOL-AM, BLOOD  PROCALCITONIN  BASIC METABOLIC PANEL  CBC   EKG ED ECG REPORT I, Naaman Plummer, the attending  physician, personally viewed and interpreted this ECG. Date: 07/29/2022 EKG Time: 1442 Rate: 111 Rhythm: Atrial fibrillation QRS Axis: normal Intervals: normal ST/T Wave abnormalities: normal Narrative Interpretation: Atrial fibrillation.  No evidence of acute ischemia RADIOLOGY ED MD interpretation: CT of the chest abdomen pelvis with IV contrast shows stable right apical mass with stable bony destruction of the adjacent cervical thoracic vertebral bodies and overlying right first through third ribs as well as stable metastatic adenopathy within the mediastinum and the right hilum.  There is interval development of bilateral airspace disease greatest in the lower lobes as well as a large amount of retained stool -Agree with radiology assessment Official radiology report(s): CT CHEST ABDOMEN PELVIS W CONTRAST  Result Date: 07/29/2022 CLINICAL DATA:  Right arm pain, sepsis, known right apical mass EXAM: CT CHEST, ABDOMEN, AND PELVIS WITH CONTRAST TECHNIQUE: Multidetector CT imaging of the chest, abdomen and pelvis was performed following the standard protocol during bolus administration of intravenous contrast. RADIATION DOSE REDUCTION: This exam was performed according to the departmental dose-optimization program which includes automated exposure control, adjustment of the mA and/or kV according to patient size and/or use of iterative reconstruction technique. CONTRAST:  174m OMNIPAQUE IOHEXOL 300 MG/ML  SOLN COMPARISON:  07/19/2022 FINDINGS: CT CHEST FINDINGS Cardiovascular: Stable cardiomegaly, with prominent biatrial dilatation. Retained lead within the right ventricle unchanged. No pericardial effusion. Stable coronary artery atherosclerosis. No evidence of thoracic aortic aneurysm or dissection. Stable aortic atherosclerosis. Stable dilated main pulmonary artery consistent with pulmonary arterial hypertension. Mediastinum/Nodes: Pathologic mediastinal and right hilar adenopathy. Right hilar  adenopathy measures up to 2.6 cm in short axis. Right paratracheal lymph node measures up to 1.2 cm in short axis. Stable appearance of the thyroid, trachea, and esophagus. No acute finding. Lungs/Pleura: A destructive right apical mass is again identified, the superior extent not imaged due to slice selection. This mass measures 9.2 x 7.3 cm in transverse dimension, with destructive changes of the overlying ribs and adjacent thoracic vertebral bodies. Since the prior exam, bilateral areas of airspace disease have developed, greatest within the lower lobes. Findings are consistent with aspiration or infection. No effusion or pneumothorax. The central airways are patent. Musculoskeletal: Destructive changes of the right first and second ribs as well as the C7, T1, and T2 vertebral bodies again noted consistent with direct invasion by the right apical malignancy. There are no acute displaced fractures. Reconstructed images demonstrate no additional findings. CT  ABDOMEN PELVIS FINDINGS Hepatobiliary: No focal liver abnormality is seen. No gallstones, gallbladder wall thickening, or biliary dilatation. Pancreas: Unremarkable. No pancreatic ductal dilatation or surrounding inflammatory changes. Spleen: Normal in size without focal abnormality. Adrenals/Urinary Tract: Marked cortical scarring upper pole left kidney. Otherwise the kidneys enhance normally. No urinary tract calculi or obstructive uropathy. Bladder is decompressed, limiting its evaluation. Nonspecific nodularity left adrenal gland measuring 1.1 cm, which did not demonstrate FDG uptake on recent PET scan. The right adrenal is unremarkable. Stomach/Bowel: No bowel obstruction or ileus. Retained stool throughout the colon, with a large amount of stool in the rectal vault concerning for fecal impaction. Normal appendix right lower quadrant. No bowel wall thickening or inflammatory change. Vascular/Lymphatic: IVC filter. 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 Atherosclerosis of the abdominal aorta and its branches. No pathologic adenopathy. Reproductive: Prostate is unremarkable. Other: No free fluid or free intraperitoneal gas. No abdominal wall hernia. Musculoskeletal: No acute or destructive bony lesions. Reconstructed images demonstrate no additional findings. IMPRESSION: 1. Stable right apical mass, with stable bony destruction of the adjacent cervical and thoracic vertebral bodies and overlying right first through third ribs. 2. Stable metastatic adenopathy within the mediastinum and right hilum. 3. Interval development of bilateral airspace disease, greatest in the lower lobes, which may reflect infection or aspiration. 4. Large amount of retained stool within the rectal vault, consistent with fecal impaction. No bowel obstruction or ileus. 5.  Aortic Atherosclerosis (ICD10-I70.0). Electronically Signed   By: Randa Ngo M.D.   On: 07/29/2022 19:06   DG Chest 1 View  Result Date: 07/29/2022 CLINICAL DATA:  Sepsis EXAM: CHEST  1 VIEW COMPARISON:  Portable exam 1542 hours compared to 06/25/2022 FINDINGS: Enlargement of cardiac silhouette. Question leadless pacemaker projecting over LEFT heart. Mediastinal contours and pulmonary vascularity normal. Minimal RIGHT basilar atelectasis. Lungs otherwise clear. No acute infiltrate, pleural effusion, or pneumothorax. Bones demineralized. IMPRESSION: Enlargement of cardiac silhouette with minimal RIGHT basilar atelectasis. Electronically Signed   By: Lavonia Dana M.D.   On: 07/29/2022 16:00   CT Head Wo Contrast  Result Date: 07/29/2022 CLINICAL DATA:  Altered mental status EXAM: CT HEAD WITHOUT CONTRAST TECHNIQUE: Contiguous axial images were obtained from the base of the skull through the vertex without intravenous contrast. RADIATION DOSE REDUCTION: This exam was performed according to the departmental dose-optimization program which includes automated exposure  control, adjustment of the mA and/or kV according to patient size and/or use of iterative reconstruction technique. COMPARISON:  None Available. FINDINGS: Brain: Large region cephalo malacia in the LEFT frontal lobe as well as the bilateral occipital lobes not changed from comparison exam consistent remote infarctions. No intracranial hemorrhage. No midline shift or mass effect. No hydrocephalus. There are periventricular and subcortical white matter hypodensities. Generalized cortical atrophy. Vascular: No hyperdense vessel or unexpected calcification. Skull: Normal. Negative for fracture or focal lesion. Sinuses/Orbits: Paranasal sinuses and mastoid air cells are clear. Orbits are clear. Other: None. IMPRESSION: 1. No acute intracranial findings. 2. 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 3. Atrophy and white matter microvascular disease. Electronically Signed   By: Suzy Bouchard M.D.   On: 07/29/2022 15:51   PROCEDURES: Critical Care performed: No Procedures MEDICATIONS ORDERED IN ED: Medications  allopurinol (ZYLOPRIM) tablet 100 mg (has no administration in time range)  oxyCODONE (Oxy IR/ROXICODONE) immediate release tablet 10 mg (10 mg Oral Given 07/29/22 2215)  metoprolol succinate (TOPROL-XL) 24 hr tablet 12.5 mg (has no administration  in time range)  simvastatin (ZOCOR) tablet 20 mg (20 mg Oral Given 07/29/22 2216)  docusate sodium (COLACE) capsule 100 mg (100 mg Oral Given 07/29/22 2217)  senna (SENOKOT) tablet 17.2 mg (has no administration in time range)  rivaroxaban (XARELTO) tablet 20 mg (20 mg Oral Given 07/29/22 2216)  ferrous sulfate tablet 325 mg (has no administration in time range)  gabapentin (NEURONTIN) capsule 400 mg (400 mg Oral Given 07/29/22 2216)  multivitamin with minerals tablet 1 tablet (has no administration in time range)  albuterol (PROVENTIL) (2.5 MG/3ML) 0.083% nebulizer solution 2.5 mg (has no administration in time range)   latanoprost (XALATAN) 0.005 % ophthalmic solution 1 drop (1 drop Both Eyes Given 07/29/22 2217)  liver oil-zinc oxide (DESITIN) 40 % ointment 1 Application (has no administration in time range)  lactated ringers infusion (150 mL/hr Intravenous New Bag/Given 07/29/22 2221)  acetaminophen (TYLENOL) tablet 650 mg (has no administration in time range)    Or  acetaminophen (TYLENOL) suppository 650 mg (has no administration in time range)  ondansetron (ZOFRAN) tablet 4 mg (has no administration in time range)    Or  ondansetron (ZOFRAN) injection 4 mg (has no administration in time range)  insulin aspart (novoLOG) injection 0-9 Units (has no administration in time range)  vancomycin (VANCOREADY) IVPB 750 mg/150 mL (750 mg Intravenous New Bag/Given 07/29/22 2319)  sodium chloride 0.9 % bolus 1,000 mL (0 mLs Intravenous Stopped 07/29/22 1716)  ceFEPIme (MAXIPIME) 2 g in sodium chloride 0.9 % 100 mL IVPB (0 g Intravenous Stopped 07/29/22 1716)  metroNIDAZOLE (FLAGYL) IVPB 500 mg (0 mg Intravenous Stopped 07/29/22 2023)  vancomycin (VANCOCIN) IVPB 1000 mg/200 mL premix (0 mg Intravenous Stopped 07/29/22 1716)  oxyCODONE (Oxy IR/ROXICODONE) immediate release tablet 10 mg (10 mg Oral Given 07/29/22 1725)  iohexol (OMNIPAQUE) 300 MG/ML solution 100 mL (100 mLs Intravenous Contrast Given 07/29/22 1839)  acetaminophen (TYLENOL) tablet 650 mg (650 mg Oral Given 07/29/22 2035)   IMPRESSION / MDM / ASSESSMENT AND PLAN / ED COURSE  I reviewed the triage vital signs and the nursing notes.                              Patient's presentation is most consistent with acute presentation with potential threat to life or bodily function. The Pt presents with fever highly concerning for sepsis (suspected pulmonary source). At this time, the Pt is satting well on 4 L, normotensive, and appears HDS.  Will start empiric antibiotics and fluids.  Due to hypotension, will administer fluids gradually with frequent reassessment. Have  low suspicion for a GI, skin/soft tissue, or CNS source at this time, but will reconsider if initial workup is unremarkable.  - CBC, BMP, LFTs - VBG - UA - BCx x2, Lactate - EKG - CXR - CT chest abdomen pelvis shows bilateral pneumonia - Empiric Abx: *** - Fluids: 30cc/kg***   FINAL CLINICAL IMPRESSION(S) / ED DIAGNOSES   Final diagnoses:  None   Rx / DC Orders   ED Discharge Orders     None      Note:  This document was prepared using Dragon voice recognition software and may include unintentional dictation errors.
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
## 2022-07-29 NOTE — IPAL (Signed)
  Interdisciplinary Goals of Care Family Meeting   Date carried out: 07/29/2022  Location of the meeting: Phone conference  Member's involved: Physician and Family Member or next of kin, daughter Sherri Rad  Durable Power of Attorney or Loss adjuster, chartered: daughter, wife    Discussion: We discussed goals of care for [name] .   I have reviewed medical records including EPIC notes, labs and imaging, assessed the patient and then met with daughter to discuss major active diagnoses, plan of care, natural trajectory, prognosis, GOC, EOL wishes, disposition and options including Full code/DNI/DNR and the concept of comfort care if DNR is elected. Questions and concerns were addressed. They are in agreement to continue current plan of care . Election for full code status.   Code status:   Code Status: Full Code   Disposition: Continue current acute care  Time spent for the meeting: [place], MD  07/29/2022, 9:15 PM

## 2022-07-29 NOTE — Consult Note (Signed)
Pharmacy Antibiotic Note  [name] is a 72 y.o. male admitted on 07/29/2022 with altered mental status. 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  Initiate Vancomycin 750 mg Q12H. Goal AUC 400-550 Estimated AUC 463/Cmin: 14.6 Scr 1.3, IBW, Vd 0.72 MRSA PCR ordered    Height: '6\' 2"'$  (188 cm) Weight: 84.4 kg (186 lb) IBW/kg (Calculated) : 82.2  Temp (24hrs), Avg:99.2 F (37.3 C), Min:97.9 F (36.6 C), Max:100.9 F (38.3 C)  Recent Labs  Lab 07/23/22 0415 07/24/22 0549 07/26/22 0625 07/27/22 0620 07/29/22 1335 07/29/22 1536 07/29/22 1729  WBC 8.3 7.6  --   --  17.1* 17.1*  --   CREATININE 0.80 0.83 0.79 0.68 1.44* 1.30*  --   LATICACIDVEN  --   --   --   --   --  4.1* 3.1*    Estimated Creatinine Clearance: 58.8 mL/min (A) (by C-G formula based on SCr of 1.3 mg/dL (H)).    No Known Allergies  Antimicrobials this admission: 3/14 cefepime >>  3/14 vancomycin >>   Dose adjustments this admission:   Microbiology results: 3/14 BCx: sent 3/14 MRSA PCR: ordered  Thank you for allowing pharmacy to be a part of this patient's care.  Dorothe Pea, PharmD, BCPS Clinical Pharmacist   07/29/2022 9:22 PM

## 2022-07-29 NOTE — Assessment & Plan Note (Signed)
Sodium 131 but baseline has been in the mid to high 120s Continue to monitor

## 2022-07-29 NOTE — Assessment & Plan Note (Signed)
Secondary to acute illness, chronic physical deconditioning secondary to chronic illnesses PT and TOC consult Patient is cared for at home by wife and daughter

## 2022-07-29 NOTE — Assessment & Plan Note (Signed)
Followed by oncology with plans for placement of Port-A-Cath on 3/15 for initiation of chemo Discussed with family and they would like another day to think about it as wife is not yet ready

## 2022-07-29 NOTE — Consult Note (Signed)
PHARMACY -  BRIEF ANTIBIOTIC NOTE   Pharmacy has received consult(s) for vancomycin and cefepime from an ED provider.  The patient's profile has been reviewed for ht/wt/allergies/indication/available labs.    One time order(s) placed for : Cefepime 2gm IVPB x 1  Vancomycin '1000mg'$  IVPB X1  Further antibiotics/pharmacy consults should be ordered by admitting physician if indicated.                       Thank you, Harley Fitzwater Rodriguez-Guzman PharmD, BCPS 07/29/2022 3:08 PM

## 2022-07-29 NOTE — ED Notes (Signed)
Went in to see Pt and his SPO2 was 84% on 2L [place] placed Pt on 4L [place] and Pt's SPO2 went back to 100%

## 2022-07-29 NOTE — Assessment & Plan Note (Signed)
History of PE Continue Xarelto

## 2022-07-29 NOTE — Assessment & Plan Note (Signed)
Creatinine 1.3, up from 0.68 just 2 days prior Secondary to sepsis Expecting improvement with IV fluid resuscitation

## 2022-07-29 NOTE — Assessment & Plan Note (Signed)
-   Continue Xarelto and metoprolol 

## 2022-07-29 NOTE — Assessment & Plan Note (Signed)
Secondary to sepsis. Daughter describes weakness and confusion over the past couple days

## 2022-07-29 NOTE — Assessment & Plan Note (Signed)
Supportive care Continue simvastatin

## 2022-07-29 NOTE — Assessment & Plan Note (Signed)
Sliding scale insulin coverage 
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
## 2022-07-29 NOTE — Sepsis Progress Note (Signed)
Elink following code sepsis °

## 2022-07-30 ENCOUNTER — Ambulatory Visit: Payer: 59

## 2022-07-30 ENCOUNTER — Inpatient Hospital Stay: Payer: 59

## 2022-07-30 ENCOUNTER — Other Ambulatory Visit: Payer: 59

## 2022-07-30 ENCOUNTER — Ambulatory Visit: Payer: 59 | Admitting: Hospice and Palliative Medicine

## 2022-07-30 ENCOUNTER — Ambulatory Visit
Admission: RE | Admit: 2022-07-30 | Discharge: 2022-07-30 | Disposition: A | Discharge status: Home / Self Care | Payer: 59 | Source: Ambulatory Visit | Attending: Oncology | Admitting: Oncology

## 2022-07-30 ENCOUNTER — Encounter: Payer: Self-pay | Admitting: Oncology

## 2022-07-30 DIAGNOSIS — R6520 Severe sepsis without septic shock: Secondary | ICD-10-CM | POA: Diagnosis not present

## 2022-07-30 DIAGNOSIS — A419 Sepsis, unspecified organism: Secondary | ICD-10-CM | POA: Diagnosis not present

## 2022-07-30 LAB — BASIC METABOLIC PANEL
Anion gap: 12 (ref 5–15)
BUN: 17 mg/dL (ref 8–23)
CO2: 22 mmol/L (ref 22–32)
Calcium: 8.7 mg/dL — ABNORMAL LOW (ref 8.9–10.3)
Chloride: 99 mmol/L (ref 98–111)
Creatinine, Ser: 0.96 mg/dL (ref 0.61–1.24)
GFR, Estimated: >60 mL/min (ref >60)
Glucose, Bld: 112 mg/dL — ABNORMAL HIGH (ref 70–99)
Potassium: 4.6 mmol/L (ref 3.5–5.1)
Sodium: 133 mmol/L — ABNORMAL LOW (ref 135–145)

## 2022-07-30 LAB — CBC
HCT: 24.1 % — ABNORMAL LOW (ref 39.0–52.0)
Hemoglobin: 7.7 g/dL — ABNORMAL LOW (ref 13.0–17.0)
MCH: 24.6 pg — ABNORMAL LOW (ref 26.0–34.0)
MCHC: 32 g/dL (ref 30.0–36.0)
MCV: 77 fL — ABNORMAL LOW (ref 80.0–100.0)
Platelets: 192 10*3/uL (ref 150–400)
RBC: 3.13 MIL/uL — ABNORMAL LOW (ref 4.22–5.81)
RDW: 17 % — ABNORMAL HIGH (ref 11.5–15.5)
WBC: 11.2 10*3/uL — ABNORMAL HIGH (ref 4.0–10.5)
nRBC: 0 % (ref 0.0–0.2)

## 2022-07-30 LAB — PROTIME-INR
INR: 2 — ABNORMAL HIGH (ref 0.8–1.2)
Prothrombin Time: 22.1 seconds — ABNORMAL HIGH (ref 11.4–15.2)

## 2022-07-30 LAB — PROCALCITONIN: Procalcitonin: 2.36 ng/mL

## 2022-07-30 LAB — LACTIC ACID, PLASMA: Lactic Acid, Venous: 5.4 mmol/L (ref 0.5–1.9)

## 2022-07-30 LAB — CORTISOL-AM, BLOOD: Cortisol - AM: 17.2 ug/dL (ref 6.7–22.6)

## 2022-07-30 LAB — MRSA NEXT GEN BY PCR, NASAL: MRSA by PCR Next Gen: NOT DETECTED

## 2022-07-30 MED ORDER — ACETAMINOPHEN 325 MG PO TABS
650.0000 mg | ORAL_TABLET | Freq: Four times a day (QID) | ORAL | Status: DC | PRN
  Administered 2022-07-30 – 2022-08-02 (×6): 650 mg via ORAL
  Filled 2022-07-30 (×7): qty 2

## 2022-07-30 MED ORDER — SORBITOL 70 % SOLN
960.0000 mL | TOPICAL_OIL | Freq: Once | ORAL | Status: AC
  Administered 2022-07-30: 960 mL via RECTAL
  Filled 2022-07-30: qty 240

## 2022-07-30 MED ORDER — ACETAMINOPHEN 650 MG RE SUPP: 650.0000 mg | Freq: Four times a day (QID) | RECTAL | Status: DC | PRN

## 2022-07-30 MED ORDER — OXYCODONE HCL 5 MG PO TABS
10.0000 mg | ORAL_TABLET | Freq: Three times a day (TID) | ORAL | Status: DC | PRN
  Administered 2022-07-31 (×2): 10 mg via ORAL
  Filled 2022-07-30 (×3): qty 2

## 2022-07-30 MED ORDER — LACTATED RINGERS IV SOLN: INTRAVENOUS | Status: DC

## 2022-07-30 MED ORDER — SODIUM CHLORIDE 0.9 % IV SOLN
2.0000 g | Freq: Three times a day (TID) | INTRAVENOUS | Status: DC
  Administered 2022-07-30 – 2022-08-03 (×14): 2 g via INTRAVENOUS
  Filled 2022-07-30: qty 2
  Filled 2022-07-30 (×2): qty 12.5
  Filled 2022-07-30: qty 2
  Filled 2022-07-30 (×3): qty 12.5
  Filled 2022-07-30: qty 2
  Filled 2022-07-30 (×6): qty 12.5
  Filled 2022-07-30: qty 2

## 2022-07-30 MED FILL — Dexamethasone Sodium Phosphate Inj 100 MG/10ML: INTRAMUSCULAR | Qty: 1 | Status: AC
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
## 2022-07-30 NOTE — Plan of Care (Signed)
  Problem: Education: Goal: Knowledge of cardiac device and self-care will improve Outcome: Progressing Goal: Ability to safely manage health related needs after discharge will improve Outcome: Progressing Goal: Individualized Educational Video(s) Outcome: Progressing   Problem: Cardiac: Goal: Ability to achieve and maintain adequate cardiopulmonary perfusion will improve Outcome: Progressing   Problem: Fluid Volume: Goal: Hemodynamic stability will improve Outcome: Progressing   Problem: Clinical Measurements: Goal: Diagnostic test results will improve Outcome: Progressing Goal: Signs and symptoms of infection will decrease Outcome: Progressing   Problem: Respiratory: Goal: Ability to maintain adequate ventilation will improve Outcome: Progressing   Problem: Education: Goal: Knowledge of General Education information will improve Description: Including pain rating scale, medication(s)/side effects and non-pharmacologic comfort measures Outcome: Progressing   Problem: Health Behavior/Discharge Planning: Goal: Ability to manage health-related needs will improve Outcome: Progressing   Problem: Clinical Measurements: Goal: Ability to maintain clinical measurements within normal limits will improve Outcome: Progressing Goal: Will remain free from infection Outcome: Progressing Goal: Diagnostic test results will improve Outcome: Progressing Goal: Respiratory complications will improve Outcome: Progressing Goal: Cardiovascular complication will be avoided Outcome: Progressing   Problem: Activity: Goal: Risk for activity intolerance will decrease Outcome: Progressing   Problem: Nutrition: Goal: Adequate nutrition will be maintained Outcome: Progressing   Problem: Coping: Goal: Level of anxiety will decrease Outcome: Progressing   Problem: Elimination: Goal: Will not experience complications related to bowel motility Outcome: Progressing Goal: Will not experience  complications related to urinary retention Outcome: Progressing   Problem: Pain Managment: Goal: General experience of comfort will improve Outcome: Progressing   Problem: Safety: Goal: Ability to remain free from injury will improve Outcome: Progressing   Problem: Skin Integrity: Goal: Risk for impaired skin integrity will decrease Outcome: Progressing   Problem: Education: Goal: Ability to describe self-care measures that may prevent or decrease complications (Diabetes Survival Skills Education) will improve Outcome: Progressing Goal: Individualized Educational Video(s) Outcome: Progressing   Problem: Coping: Goal: Ability to adjust to condition or change in health will improve Outcome: Progressing   Problem: Fluid Volume: Goal: Ability to maintain a balanced intake and output will improve Outcome: Progressing   Problem: Health Behavior/Discharge Planning: Goal: Ability to identify and utilize available resources and services will improve Outcome: Progressing Goal: Ability to manage health-related needs will improve Outcome: Progressing   Problem: Metabolic: Goal: Ability to maintain appropriate glucose levels will improve Outcome: Progressing   Problem: Skin Integrity: Goal: Risk for impaired skin integrity will decrease Outcome: Progressing

## 2022-07-30 NOTE — Consult Note (Signed)
Pharmacy Antibiotic Note  [name] is a 72 y.o. male admitted on 07/29/2022 with altered mental status. 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 Stop vancomycin due to MRSA PCR negative.   Height: 6\' 2"  (188 cm) Weight: 84.4 kg (186 lb) IBW/kg (Calculated) : 82.2  Temp (24hrs), Avg:98.7 F (37.1 C), Min:97.8 F (36.6 C), Max:100.9 F (38.3 C)  Recent Labs  Lab 07/24/22 0549 07/26/22 0625 07/27/22 0620 07/29/22 1335 07/29/22 1536 07/29/22 1729 07/30/22 0525  WBC 7.6  --   --  17.1* 17.1*  --  11.2*  CREATININE 0.83 0.79 0.68 1.44* 1.30*  --  0.96  LATICACIDVEN  --   --   --   --  4.1* 3.1*  --      Estimated Creatinine Clearance: 79.7 mL/min (by C-G formula based on SCr of 0.96 mg/dL).    No Known Allergies  Antimicrobials this admission: 3/14 cefepime >>  3/14 vancomycin >> 3/15  Dose adjustments this admission:   Microbiology results: 3/14 BCx: sent 3/14 MRSA PCR: ordered  Thank you for allowing pharmacy to be a part of this patient's care.  Oswald Hillock, PharmD, BCPS Clinical Pharmacist   07/30/2022 8:03 AM

## 2022-07-30 NOTE — Progress Notes (Signed)
Call received from pt's daughter stating that pt has been having increased confusion and agitation for the past few days and she is concerned that pt may have a UTI. Informed that we can see pt in [facility] with labs and urine sample. Instructed to keep appt today for CT simulation and that we will add on other visits after that appt today. Pt's daughter verbalized understanding. Nothing further needed at this time. Will continue to follow.

## 2022-07-30 NOTE — Progress Notes (Signed)
PROGRESS NOTE    [name]  [id number] DOB: Oct 08, 1950 DOA: 07/29/2022 PCP: [facility]  [id number]  LOS: 1 day   Brief hospital course:   Assessment & Plan: [name] is a 72 y.o. 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 He was very weak.     Aspiration pneumonia (HCC) Severe sepsis Acute respiratory failure with hypoxia Severe sepsis criteria include tachycardia, tachypnea, hypotension, leukocytosis with lactic acidosis --CT c/a/p showed Interval development of bilateral airspace disease, greatest in the lower lobes, which may reflect infection or aspiration. --O2 sat was 85% on room air patient was tachypneic to 25, needing 4 L O2 to maintain sats in the mid to high 90s --started on vanc and cefepime on presentation.  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[place]) Followed by oncology with plans for placement of Port-A-Cath on 3/15 for initiation of  chemo  Acute metabolic encephalopathy Secondary to sepsis and stool impaction  AKI (acute kidney injury) ([place]) Creatinine 1.3, up from 0.68 just 2 days prior Secondary to sepsis --Cr improved with IVF --cont MIVF@75   Chronic hyponatremia Sodium 131 but baseline has been in the mid to high 120s Continue to monitor  History of DVT (deep vein thrombosis) History of PE Continue Xarelto  Atrial fibrillation, chronic (HCC) Continue Xarelto and metoprolol  Type 2 diabetes mellitus with diabetic polyneuropathy, without long-term current use of insulin (HCC) --BG have been within inpatient goal --d/c BG checks and SSI  Essential hypertension --cont Toprol for rate control  History of CVA with residual deficit Supportive care Continue simvastatin  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 07/26/2022 No acute issues suspected  Chronic right arm pain on chronic opioids --cont home oxycodone 10 mg TID PRN   DVT prophylaxis: [id number]  Code Status: Full code Palliative care following Family Communication:  Level of care: Progressive Dispo:   The patient is from: home Anticipated d/c is to: to be determined Anticipated d/c date is: to be determined   Subjective and Interval History:  Pt reported having a little shortness of breath.  Main complaint was right arm pain which is chronic.   Objective: Vitals:   07/30/22 0734 07/30/22 1100 07/30/22 1600 07/30/22 1713  BP: 115/65 112/75 (!) 145/84 126/73  Pulse: 82 99  96  Resp: 18 16 20  (!) 24  Temp: 98.3 F (36.8 C) 97.7 F (36.5 C)  97.6 F (36.4 C)  TempSrc: Oral Axillary    SpO2: 100% 100%  100%  Weight:      Height:        Intake/Output Summary (Last 24 hours)  at 07/30/2022 1759 Last data filed at 07/30/2022 1706 Gross per 24 hour  Intake 1544.19 ml  Output 875 ml  Net 669.19 ml   Filed Weights   07/29/22 1924  Weight: 84.4 kg    Examination:   Constitutional: NAD, AAOx3 HEENT: conjunctivae and  lids normal, EOMI CV: No cyanosis.   RESP: phlegmy breath sounds, on 2L Neuro: II - XII grossly intact.     Data Reviewed: I have personally reviewed labs and imaging studies  Time spent: 50 minutes  Enzo Bi, MD [facility] If 7PM-7AM, please contact night-coverage 07/30/2022, 5:59 PM

## 2022-07-30 NOTE — TOC Initial Note (Signed)
Transition of Care [facility]) - Initial/Assessment Note    Patient Details  Name: [name] MRN: [id number] Date of Birth: 01-14-1951  Transition of Care [facility]) CM/SW Contact:    Candie Chroman, LCSW Phone Number: 07/30/2022, 12:16 PM  Clinical Narrative:  CSW is familiar with patient from last admission. Patient was previously admitted from [facility] where he was getting rehab. He had used 17 days and would have to pay a week of copays up front to return. Significant other and daughter decided to bring him home at discharge. Daughter is a [employer] manager. Patient was set up with [facility] for PT, OT, SW and a wheelchair was ordered. [facility] was unable to start services before this admission but they confirmed they can still accept him at discharge.                Expected Discharge Plan: [place] Barriers to Discharge: Continued Medical Work up   Patient Goals and CMS Choice     Choice offered to / list presented to : NA      Expected Discharge Plan and Services       Living arrangements for the past 2 months: Single Family Home                           HH Arranged: PT, OT, Social Work [employer] Agency: [place] Date [place]: 07/30/22   Representative spoke with at [place]: Floydene Flock  Prior Living Arrangements/Services Living arrangements for the past 2 months: [place] Lives with:: Self Patient language and need for interpreter reviewed:: Yes        Need for Family Participation in Patient Care: Yes (Comment) Care giver support system in place?: Yes (comment) Current home services: DME, Home OT, Home PT, Other (comment) (Home Education officer, museum) Criminal Activity/Legal Involvement Pertinent to Current Situation/Hospitalization: No - Comment as needed  Activities of Daily Living      Permission Sought/Granted                  Emotional Assessment Appearance:: Appears stated age      Orientation: : Oriented to Self, Oriented to Place, Oriented to  Time Alcohol / Substance Use: Not Applicable Psych Involvement: No (comment)  Admission diagnosis:  Severe sepsis ([place]) [A41.9, R65.20] Patient Active Problem List   Diagnosis Date Noted   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 07/26/2022 07/29/2022   AMS (altered mental status) 07/19/2022   Hypotension 07/19/2022   Intractable pain 06/29/2022   Palliative care encounter 06/28/2022   Generalized weakness 06/26/2022   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[place]) 06/11/2022   Hypercalcemia 06/11/2022   Chronic hyponatremia 06/11/2022   Dyslipidemia 06/11/2022   History of pulmonary embolism 06/11/2022   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) 05/11/2022   History of CVA with residual deficit 05/11/2022   Iron deficiency 05/11/2022   Chronic venous insufficiency 03/03/2022   Hyperlipidemia 03/03/2022   SOB (shortness of breath) 09/15/2021   Hypertensive urgency 09/15/2021   Pneumonia of right lower lobe due to infectious organism 08/09/2021   Elevated troponin 01/28/2020   UTI (urinary tract  infection) 01/28/2020   Hypokalemia 01/28/2020   Diarrhea 01/28/2020   Chest pain 01/28/2020   Chronic combined systolic and diastolic CHF (congestive heart failure) ([place]) [id number]   Acute metabolic encephalopathy [id number]   Coagulation disorder ([place]) 11/29/2019   AKI (acute kidney injury) ([place])    History of cocaine abuse ([place])    Acute ischemic right PCA stroke ([place]) 09/13/2019   Vision loss, bilateral    Tobacco abuse    Bradycardia    Non-sustained ventricular tachycardia (HCC)    Orthostatic dizziness 09/12/2019   Nonintractable  headache    Slurred speech    Mixed diabetic hyperlipidemia associated with type 2 diabetes mellitus (HCC)    Finger joint swelling, right 06/27/2019   Bilateral hand pain 06/14/2019   PAD (peripheral artery disease) ([place]) 05/20/2019   Encounter for long-term (current) use of high-risk medication 04/30/2019   Atrial fibrillation, chronic ([place]) 04/08/2019   Fever 04/08/2019   Severe sepsis ([place]) 04/08/2019   Abdominal pain 04/07/2019   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[place]) 04/07/2019   Polyarthralgia 03/05/2019   Thrombocytopenia ([place]) 03/05/2019   Pain due to onychomycosis of toenails of both feet 11/20/2018   Diabetic neuropathy ([place]) 11/20/2018   Influenza A 06/22/2018   Community acquired pneumonia 06/22/2018   Arthritis 05/12/2017   Varicose veins of leg with swelling, bilateral 01/18/2017   Facial twitching    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 ([place]) 02/24/2015   Aspiration pneumonia ([place]) 10/12/2014   Osteoarthritis of right knee 02/11/2014   Pain and swelling of knee 01/08/2014   Pain in extremity 01/08/2014   Weakness due to cerebrovascular accident 01/08/2014   Pulmonary embolism ([place]) 06/19/2012   Gout 09/22/2011   CVA, old, speech/language deficit 09/01/2011   Cerebral infarction ([place]) 04/26/2011   Type 2 diabetes mellitus with diabetic polyneuropathy, without long-term current use of insulin ([place]) 04/26/2011   Accelerated hypertension 04/26/2011   Anemia 04/26/2011   DVT (deep venous thrombosis) ([place]) 04/26/2011   Essential hypertension 04/26/2011   PCP:  [facility] [place]:   [address] ([address]), [place] [address] [place] [place] ([address]) [place] [address] Phone: [phone] Fax: [phone]     Social Determinants of Health (SDOH) Social History: SDOH Screenings   Food Insecurity: No Food Insecurity (07/20/2022)  Housing: Low Risk  (07/20/2022)  Transportation Needs:  No Transportation Needs (07/20/2022)  Utilities: Not At Risk (07/20/2022)  Alcohol Screen: Low Risk  (06/21/2022)  Depression (PHQ2-9): Medium Risk (06/18/2022)  Financial Resource Strain: Low Risk  (06/21/2022)  Physical Activity: Inactive (06/21/2022)  Social Connections: Socially Isolated (06/21/2022)  Stress: Stress Concern Present (06/21/2022)  Tobacco Use: High Risk (07/29/2022)   SDOH Interventions:     Readmission Risk Interventions    01/29/2020   12:41 PM  Readmission Risk Prevention Plan  Transportation Screening Complete  Palliative Care Screening Not Applicable  Medication Review (RN Care Manager) Complete

## 2022-07-31 ENCOUNTER — Inpatient Hospital Stay: Payer: 59

## 2022-07-31 DIAGNOSIS — A419 Sepsis, unspecified organism: Secondary | ICD-10-CM | POA: Diagnosis not present

## 2022-07-31 DIAGNOSIS — R6520 Severe sepsis without septic shock: Secondary | ICD-10-CM | POA: Diagnosis not present

## 2022-07-31 LAB — CBC
HCT: 25.9 % — ABNORMAL LOW (ref 39.0–52.0)
Hemoglobin: 8.2 g/dL — ABNORMAL LOW (ref 13.0–17.0)
MCH: 24.1 pg — ABNORMAL LOW (ref 26.0–34.0)
MCHC: 31.7 g/dL (ref 30.0–36.0)
MCV: 76.2 fL — ABNORMAL LOW (ref 80.0–100.0)
Platelets: 211 10*3/uL (ref 150–400)
RBC: 3.4 MIL/uL — ABNORMAL LOW (ref 4.22–5.81)
RDW: 16.8 % — ABNORMAL HIGH (ref 11.5–15.5)
WBC: 8.8 10*3/uL (ref 4.0–10.5)
nRBC: 0 % (ref 0.0–0.2)

## 2022-07-31 LAB — BASIC METABOLIC PANEL
Anion gap: 9 (ref 5–15)
BUN: 14 mg/dL (ref 8–23)
CO2: 22 mmol/L (ref 22–32)
Calcium: 8.3 mg/dL — ABNORMAL LOW (ref 8.9–10.3)
Chloride: 99 mmol/L (ref 98–111)
Creatinine, Ser: 0.71 mg/dL (ref 0.61–1.24)
GFR, Estimated: >60 mL/min (ref >60)
Glucose, Bld: 106 mg/dL — ABNORMAL HIGH (ref 70–99)
Potassium: 4.1 mmol/L (ref 3.5–5.1)
Sodium: 130 mmol/L — ABNORMAL LOW (ref 135–145)

## 2022-07-31 LAB — LACTIC ACID, PLASMA: Lactic Acid, Venous: 1.6 mmol/L (ref 0.5–1.9)

## 2022-07-31 LAB — MAGNESIUM: Magnesium: 1.6 mg/dL — ABNORMAL LOW (ref 1.7–2.4)

## 2022-07-31 MED ORDER — OXYCODONE HCL 5 MG PO TABS
10.0000 mg | ORAL_TABLET | ORAL | Status: DC | PRN
  Administered 2022-07-31 – 2022-08-04 (×16): 10 mg via ORAL
  Filled 2022-07-31 (×16): qty 2

## 2022-07-31 MED ORDER — MAGNESIUM SULFATE 2 GM/50ML IV SOLN
2.0000 g | Freq: Once | INTRAVENOUS | Status: AC
  Administered 2022-07-31: 2 g via INTRAVENOUS
  Filled 2022-07-31: qty 50

## 2022-07-31 MED ORDER — OXYCODONE HCL 5 MG PO TABS: 10.0000 mg | ORAL_TABLET | Freq: Four times a day (QID) | ORAL | Status: DC | PRN

## 2022-07-31 MED ORDER — POLYETHYLENE GLYCOL 3350 17 G PO PACK
17.0000 g | PACK | Freq: Two times a day (BID) | ORAL | Status: DC
  Administered 2022-07-31 – 2022-08-04 (×7): 17 g via ORAL
  Filled 2022-07-31 (×8): qty 1

## 2022-07-31 NOTE — Evaluation (Signed)
Physical Therapy Evaluation Patient Details Name: [name] MRN: [id number] DOB: 22-Jan-1951 Today's Date: 07/31/2022  History of Present Illness  [name] is a 27yoM recently here  with concerns of hypercalcemia, now returns from home with sepsis and weakness.  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  Clinical Impression  Pt laying in bed seemly tired but willing to participate with PT.  He was difficult to understand at times but appeared alert and oriented when participating and follow basic cues well.  However he c/o severe pain in R shoulder at rest that only worsened in sitting EOB position.  He did not tolerate this long and while cuing and prepping to attempt standing EOB he said "I can't, I'm hurting too much" grapped his R arm and leaned back over onto the bed.  Discussed with/MD aware of R shoulder c/o pain - which seemed to be the biggest limiter today.  Difficult to really get an idea of pt's functional level 2/2 abortion of eval at this point - unable to assess OOB.  Appears pt is functionally too weak and limited to d/c home, will maintain on PT caseload to further assess as pt is able to and to address functional limitations.   Recommendations for follow up therapy are one component of a multi-disciplinary discharge planning process, led by the attending physician.  Recommendations may be updated based on patient status, additional functional criteria and insurance authorization.  Follow Up Recommendations Skilled nursing-short term rehab (<3 hours/day) Can patient physically be transported by private vehicle: No    Assistance Recommended at Discharge Frequent or constant Supervision/Assistance  Patient can return home with the following  Assistance with cooking/housework;Direct supervision/assist for medications management;Assist for transportation;Help with stairs  or ramp for entrance;A lot of help with walking and/or transfers;A lot of help with bathing/dressing/bathroom;Assistance with feeding    Equipment Recommendations  (TBD)  Recommendations for Other Services       Functional Status Assessment Patient has had a recent decline in their functional status and demonstrates the ability to make significant improvements in function in a reasonable and predictable amount of time.     Precautions / Restrictions Precautions Precautions: Fall Restrictions Weight Bearing Restrictions: No      Mobility  Bed Mobility Overal bed mobility: Needs Assistance Bed Mobility: Supine to Sit, Sit to Supine     Supine to sit: Min assist Sit to supine: Min assist   General bed mobility comments: Pt was able to transition to/from sitting relatively well - however just maintaining sitting EOB for ~ 86minute lead to increasing R shoulder pain severity and he deferred further mobility and requested to lay back down    Transfers                   General transfer comment: Pt declines attempts at standing 2/2 c/o severe R shoulder pain in seated/gravity dependent position    Ambulation/Gait                  Stairs            Wheelchair Mobility    Modified Rankin (Stroke Patients Only)       Balance Overall balance assessment: Needs assistance Sitting-balance support: Feet supported Sitting balance-Leahy Scale: Good Sitting balance - Comments: steady static sitting  Pertinent Vitals/Pain Pain Assessment Pain Assessment: 0-10 Faces Pain Scale: Hurts worst Pain Location: R shoulder pain is, seeminly, excrutiating at rest and especially with attempts at moving    [place] expects to be discharged to:: Skilled nursing facility Living Arrangements: Alone Available Help at Discharge: Available PRN/intermittently (girlfriend)   Home Access: Level entry        Home Layout: One level Home Equipment: Cane - single point;BSC/3in1;Wheelchair - manual      Prior Function Prior Level of Function : Independent/Modified Independent             Mobility Comments: apparently before recent prior admission AMB c [facility] household distances, driving; recent AMB using RW, however RUE pain has been progressively more  limiting. ADLs Comments: apparently relatively independent just a month or 2 ago but needing  more assist recently     Hand Dominance   Dominant Hand: Right    Extremity/Trunk Assessment   Upper Extremity Assessment Upper Extremity Assessment:  (unable to move/lift R shoulder - very guarded, L UE grossly 4-/5) RUE Deficits / Details: h/o R shoulder/UE pain that does appear even more severe this date per prior notes    Lower Extremity Assessment Lower Extremity Assessment: Generalized weakness       Communication   Communication: HOH;Expressive difficulties  Cognition Arousal/Alertness: Awake/alert Behavior During Therapy: Flat affect Overall Cognitive Status: Difficult to assess                                 General Comments: Pt with difficulty communicating due to affects of past CVA, oriented to self, place, and limited situation.  Follows simple instruction well but slow to coax some responses out of home        General Comments      Exercises     Assessment/Plan    PT Assessment Patient needs continued PT services  PT Problem List Decreased strength;Decreased range of motion;Decreased activity tolerance;Decreased balance;Decreased mobility;Decreased cognition;Decreased safety awareness;Decreased knowledge of precautions;Cardiopulmonary status limiting activity;Pain       PT Treatment Interventions Gait training;Functional mobility training;Therapeutic activities;Therapeutic exercise;Balance training;Neuromuscular re-education;Cognitive remediation;Patient/family education    PT Goals (Current  goals can be found in the Care Plan section)  Acute Rehab PT Goals Patient Stated Goal: control R shoulder pain PT Goal Formulation: Patient unable to participate in goal setting Time For Goal Achievement:  Potential to Achieve Goals: Fair    Frequency Min 2X/week     Co-evaluation               AM-PAC PT "6 Clicks" Mobility  Outcome Measure Help needed turning from your back to your side while in a flat bed without using bedrails?: A Little Help needed moving from lying on your back to sitting on the side of a flat bed without using bedrails?: A Little Help needed moving to and from a bed to a chair (including a wheelchair)?: A Little Help needed standing up from a chair using your arms (e.g., wheelchair or bedside chair)?: A Lot Help needed to walk in hospital room?: Total Help needed climbing 3-5 steps with a railing? : Total 6 Click Score: 13    End of Session Equipment Utilized During Treatment: Gait belt Activity Tolerance: Patient limited by pain Patient left: in bed;with call bell/phone within reach;with nursing/sitter in room Nurse Communication: Mobility status PT Visit Diagnosis: Unsteadiness on feet (R26.81);Other abnormalities of gait and mobility (R26.89);Difficulty in  walking, not elsewhere classified (R26.2);Dizziness and giddiness (R42);Pain Pain - Right/Left: Right Pain - part of body: Arm    Time: [id number] PT Time Calculation (min) (ACUTE ONLY): 17 min   Charges:   PT Evaluation $PT Eval Low Complexity: 1 Low          Kreg Shropshire, DPT 07/31/2022, 1:35 PM

## 2022-07-31 NOTE — Plan of Care (Signed)
  Problem: Education: Goal: Knowledge of cardiac device and self-care will improve Outcome: Progressing Goal: Ability to safely manage health related needs after discharge will improve Outcome: Progressing Goal: Individualized Educational Video(s) Outcome: Progressing   Problem: Cardiac: Goal: Ability to achieve and maintain adequate cardiopulmonary perfusion will improve Outcome: Progressing   Problem: Fluid Volume: Goal: Hemodynamic stability will improve Outcome: Progressing   Problem: Clinical Measurements: Goal: Diagnostic test results will improve Outcome: Progressing Goal: Signs and symptoms of infection will decrease Outcome: Progressing   Problem: Respiratory: Goal: Ability to maintain adequate ventilation will improve Outcome: Progressing   Problem: Education: Goal: Knowledge of General Education information will improve Description: Including pain rating scale, medication(s)/side effects and non-pharmacologic comfort measures Outcome: Progressing   Problem: Health Behavior/Discharge Planning: Goal: Ability to manage health-related needs will improve Outcome: Progressing   Problem: Clinical Measurements: Goal: Ability to maintain clinical measurements within normal limits will improve Outcome: Progressing Goal: Will remain free from infection Outcome: Progressing Goal: Diagnostic test results will improve Outcome: Progressing Goal: Respiratory complications will improve Outcome: Progressing Goal: Cardiovascular complication will be avoided Outcome: Progressing   Problem: Activity: Goal: Risk for activity intolerance will decrease Outcome: Progressing   Problem: Nutrition: Goal: Adequate nutrition will be maintained Outcome: Progressing   Problem: Coping: Goal: Level of anxiety will decrease Outcome: Progressing   Problem: Elimination: Goal: Will not experience complications related to bowel motility Outcome: Progressing Goal: Will not experience  complications related to urinary retention Outcome: Progressing   Problem: Pain Managment: Goal: General experience of comfort will improve Outcome: Progressing   Problem: Safety: Goal: Ability to remain free from injury will improve Outcome: Progressing   Problem: Skin Integrity: Goal: Risk for impaired skin integrity will decrease Outcome: Progressing   Problem: Education: Goal: Ability to describe self-care measures that may prevent or decrease complications (Diabetes Survival Skills Education) will improve Outcome: Progressing Goal: Individualized Educational Video(s) Outcome: Progressing   Problem: Coping: Goal: Ability to adjust to condition or change in health will improve Outcome: Progressing   Problem: Fluid Volume: Goal: Ability to maintain a balanced intake and output will improve Outcome: Progressing   Problem: Health Behavior/Discharge Planning: Goal: Ability to identify and utilize available resources and services will improve Outcome: Progressing Goal: Ability to manage health-related needs will improve Outcome: Progressing   Problem: Metabolic: Goal: Ability to maintain appropriate glucose levels will improve Outcome: Progressing   Problem: Nutritional: Goal: Maintenance of adequate nutrition will improve Outcome: Progressing Goal: Progress toward achieving an optimal weight will improve Outcome: Progressing   Problem: Skin Integrity: Goal: Risk for impaired skin integrity will decrease Outcome: Progressing   Problem: Tissue Perfusion: Goal: Adequacy of tissue perfusion will improve Outcome: Progressing

## 2022-07-31 NOTE — Progress Notes (Signed)
PROGRESS NOTE    [name]  [id number] DOB: 24-May-1950 DOA: 07/29/2022 PCP: [facility] [place]  211A/211A-AA  LOS: 2 days   Brief hospital course:   Assessment & Plan: [name] is a 72 y.o. 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 He was very weak.     Aspiration pneumonia (HCC) Severe sepsis Acute respiratory failure with hypoxia Severe sepsis criteria include tachycardia, tachypnea, hypotension, leukocytosis with lactic acidosis --CT c/a/p showed Interval development of bilateral airspace disease, greatest in the lower lobes, which may reflect infection or aspiration. --O2 sat was 85% on room air patient was tachypneic to 25, needing 4 L O2 to maintain sats in the mid to high 90s --started on vanc and cefepime on presentation.  Vanc d/c'ed after MRSA screen neg Plan: --cont cefepime --SLP eval, cleared for dys 2 diet  Stool impaction --seen on CT --had large BM with enema --Miralax BID scheduled  Lactic acidosis, resolved --4.1 on presentation, worsened to 5.4 despite IVF --lactic acid normalized today --d/c MIVF  Generalized weakness Secondary to acute illness, chronic physical deconditioning secondary to chronic illnesses Patient is cared for at home by wife and daughter --PT eval  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[place]) Followed by oncology with  plans for placement of Port-A-Cath on 3/15 for initiation of chemo  Acute metabolic encephalopathy Secondary to sepsis and stool impaction  AKI (acute kidney injury) ([place]) Creatinine 1.3, up from 0.68 just 2 days prior Secondary to sepsis --Cr improved with IVF --d/c MIVF today  Chronic hyponatremia Sodium 131 but baseline has been in the mid to high 120s Continue to monitor  History of DVT (deep vein thrombosis) History of PE Continue Xarelto  Atrial fibrillation, chronic (HCC) Continue Xarelto and metoprolol  Type 2 diabetes mellitus with diabetic polyneuropathy, without long-term current use of insulin (HCC) --BG have been within inpatient goal --d/c'ed BG checks and SSI  Essential hypertension --cont Toprol for rate control  History of CVA with residual deficit Supportive care Continue simvastatin  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   Objective: Vitals:   07/31/22 0426 07/31/22 0820 07/31/22 1147 07/31/22 1609  BP:  123/80 116/66 106/70  Pulse:  90 86 90  Resp: 16 14 20 20   Temp: 98.9 F (37.2 C) 97.6 F (36.4 C) 98 F (36.7 C) 98 F (36.7 C)  TempSrc:  Oral Oral   SpO2:  100% 100% 99%  Weight:      Height:        Intake/Output  Summary (Last 24 hours) at 07/31/2022 1642 Last data filed at 07/31/2022 1546 Gross per 24 hour  Intake 2512.58 ml  Output 1400 ml  Net 1112.58 ml   Filed Weights   07/29/22 1924  Weight: 84.4 kg    Examination:   Constitutional: NAD,  AAOx3 HEENT: conjunctivae and lids normal, EOMI CV: No cyanosis.   RESP: normal respiratory effort Neuro: II - XII grossly intact.   Psych: depressed mood and affect.     Data Reviewed: I have personally reviewed labs and imaging studies  Time spent: 50 minutes  Enzo Bi, MD [facility] If 7PM-7AM, please contact night-coverage 07/31/2022, 4:42 PM

## 2022-07-31 NOTE — Plan of Care (Signed)
Patient [name], disoriented to time.  All meds given on time as ordered.  Pt c/o pain relieved by PRN oxycodone.  Diminished lungs, IS encouraged.  Purewick in place.  Pt cleaned and linen changed.  POC maintained, will continue to monitor.  Problem: Education: Goal: Knowledge of cardiac device and self-care will improve Outcome: Progressing Goal: Ability to safely manage health related needs after discharge will improve Outcome: Progressing Goal: Individualized Educational Video(s) Outcome: Progressing   Problem: Cardiac: Goal: Ability to achieve and maintain adequate cardiopulmonary perfusion will improve Outcome: Progressing   Problem: Fluid Volume: Goal: Hemodynamic stability will improve Outcome: Progressing   Problem: Clinical Measurements: Goal: Diagnostic test results will improve Outcome: Progressing Goal: Signs and symptoms of infection will decrease Outcome: Progressing   Problem: Respiratory: Goal: Ability to maintain adequate ventilation will improve Outcome: Progressing   Problem: Education: Goal: Knowledge of General Education information will improve Description: Including pain rating scale, medication(s)/side effects and non-pharmacologic comfort measures Outcome: Progressing   Problem: Health Behavior/Discharge Planning: Goal: Ability to manage health-related needs will improve Outcome: Progressing   Problem: Clinical Measurements: Goal: Ability to maintain clinical measurements within normal limits will improve Outcome: Progressing Goal: Will remain free from infection Outcome: Progressing Goal: Diagnostic test results will improve Outcome: Progressing Goal: Respiratory complications will improve Outcome: Progressing Goal: Cardiovascular complication will be avoided Outcome: Progressing   Problem: Activity: Goal: Risk for activity intolerance will decrease Outcome: Progressing   Problem: Nutrition: Goal: Adequate nutrition will be  maintained Outcome: Progressing   Problem: Coping: Goal: Level of anxiety will decrease Outcome: Progressing   Problem: Elimination: Goal: Will not experience complications related to bowel motility Outcome: Progressing Goal: Will not experience complications related to urinary retention Outcome: Progressing   Problem: Pain Managment: Goal: General experience of comfort will improve Outcome: Progressing   Problem: Safety: Goal: Ability to remain free from injury will improve Outcome: Progressing   Problem: Skin Integrity: Goal: Risk for impaired skin integrity will decrease Outcome: Progressing   Problem: Education: Goal: Ability to describe self-care measures that may prevent or decrease complications (Diabetes Survival Skills Education) will improve Outcome: Progressing Goal: Individualized Educational Video(s) Outcome: Progressing   Problem: Coping: Goal: Ability to adjust to condition or change in health will improve Outcome: Progressing   Problem: Fluid Volume: Goal: Ability to maintain a balanced intake and output will improve Outcome: Progressing   Problem: Health Behavior/Discharge Planning: Goal: Ability to identify and utilize available resources and services will improve Outcome: Progressing Goal: Ability to manage health-related needs will improve Outcome: Progressing   Problem: Metabolic: Goal: Ability to maintain appropriate glucose levels will improve Outcome: Progressing   Problem: Nutritional: Goal: Maintenance of adequate nutrition will improve Outcome: Progressing Goal: Progress toward achieving an optimal weight will improve Outcome: Progressing   Problem: Skin Integrity: Goal: Risk for impaired skin integrity will decrease Outcome: Progressing   Problem: Tissue Perfusion: Goal: Adequacy of tissue perfusion will improve Outcome: Progressing

## 2022-08-01 DIAGNOSIS — A419 Sepsis, unspecified organism: Secondary | ICD-10-CM | POA: Diagnosis not present

## 2022-08-01 DIAGNOSIS — R6520 Severe sepsis without septic shock: Secondary | ICD-10-CM | POA: Diagnosis not present

## 2022-08-01 LAB — BASIC METABOLIC PANEL
Anion gap: 7 (ref 5–15)
BUN: 11 mg/dL (ref 8–23)
CO2: 21 mmol/L — ABNORMAL LOW (ref 22–32)
Calcium: 8.1 mg/dL — ABNORMAL LOW (ref 8.9–10.3)
Chloride: 100 mmol/L (ref 98–111)
Creatinine, Ser: 0.68 mg/dL (ref 0.61–1.24)
GFR, Estimated: >60 mL/min (ref >60)
Glucose, Bld: 109 mg/dL — ABNORMAL HIGH (ref 70–99)
Potassium: 4.1 mmol/L (ref 3.5–5.1)
Sodium: 128 mmol/L — ABNORMAL LOW (ref 135–145)

## 2022-08-01 LAB — CBC
HCT: 22.3 % — ABNORMAL LOW (ref 39.0–52.0)
Hemoglobin: 7.2 g/dL — ABNORMAL LOW (ref 13.0–17.0)
MCH: 24.2 pg — ABNORMAL LOW (ref 26.0–34.0)
MCHC: 32.3 g/dL (ref 30.0–36.0)
MCV: 75.1 fL — ABNORMAL LOW (ref 80.0–100.0)
Platelets: 201 10*3/uL (ref 150–400)
RBC: 2.97 MIL/uL — ABNORMAL LOW (ref 4.22–5.81)
RDW: 16.8 % — ABNORMAL HIGH (ref 11.5–15.5)
WBC: 7.6 10*3/uL (ref 4.0–10.5)
nRBC: 0 % (ref 0.0–0.2)

## 2022-08-01 LAB — MAGNESIUM: Magnesium: 1.9 mg/dL (ref 1.7–2.4)

## 2022-08-01 MED ORDER — POLYETHYLENE GLYCOL 3350 17 G PO PACK
34.0000 g | PACK | Freq: Once | ORAL | Status: AC
  Administered 2022-08-01: 34 g via ORAL
  Filled 2022-08-01: qty 2

## 2022-08-01 NOTE — TOC Initial Note (Signed)
Transition of Care [facility]) - Initial/Assessment Note    Patient Details  Name: [name] MRN: [id number] Date of Birth: [birth year]  Transition of Care [facility]) CM/SW Contact:    Loreta Ave, [place] Phone Number: 08/01/2022, 1:57 PM  Clinical Narrative:                 CSW spoke with pt's significant other [name], she states she and pt's daughters plan to discuss the next steps and requested CSW follow up tomorrow. [name] states pt lives in public housing and is not allowed overnight visitors so no one would be able to come into the home to care for pt but they will explore all options. TOC will follow up with [name] tomorrow.   Expected Discharge Plan: [place] Barriers to Discharge: Continued Medical Work up   Patient Goals and CMS Choice     Choice offered to / list presented to : NA      Expected Discharge Plan and Services       Living arrangements for the past 2 months: Single Family Home                           HH Arranged: PT, OT, Social Work [employer] Agency: [place] ([place]) Date [place]: 07/30/22   Representative spoke with at [place]: Floydene Flock  Prior Living Arrangements/Services Living arrangements for the past 2 months: [place] Lives with:: Self Patient language and need for interpreter reviewed:: Yes        Need for Family Participation in Patient Care: Yes (Comment) Care giver support system in place?: Yes (comment) Current home services: DME, Home OT, Home PT, Other (comment) (Home social worker) Criminal Activity/Legal Involvement Pertinent to Current Situation/Hospitalization: No - Comment as needed  Activities of Daily Living      Permission Sought/Granted                  Emotional Assessment Appearance:: Appears stated age     Orientation: : Oriented to Self, Oriented to Place, Oriented to  Time Alcohol / Substance Use: Not Applicable Psych Involvement: No  (comment)  Admission diagnosis:  Severe sepsis ([place]) [A41.9, R65.20] Patient Active Problem List   Diagnosis Date Noted   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 07/26/2022 07/29/2022   AMS (altered mental status) 07/19/2022   Hypotension 07/19/2022   Intractable pain 06/29/2022   Palliative care encounter 06/28/2022   Generalized weakness 06/26/2022   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[place]) 06/11/2022   Hypercalcemia 06/11/2022   Chronic hyponatremia 06/11/2022   Dyslipidemia 06/11/2022   History of pulmonary embolism 06/11/2022   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 ([place]) 05/11/2022   Right shoulder pain 05/11/2022   Biceps tendon tear 05/11/2022   History of DVT (deep vein thrombosis) 05/11/2022   History of pulmonary embolus (PE) 05/11/2022   Chronic anticoagulation 05/11/2022   Heart failure with reduced ejection fraction ([place]) 05/11/2022   History of CVA with residual deficit 05/11/2022   Iron deficiency 05/11/2022   Chronic venous insufficiency 03/03/2022   Hyperlipidemia 03/03/2022   SOB (shortness of breath) 09/15/2021   Hypertensive urgency 09/15/2021   Pneumonia of right lower lobe due to infectious organism 08/09/2021   Elevated troponin 01/28/2020   UTI (urinary tract infection) 01/28/2020   Hypokalemia 01/28/2020   Diarrhea 01/28/2020   Chest pain 01/28/2020   Chronic combined systolic and diastolic  CHF (congestive heart failure) ([place]) [id number]   Acute metabolic encephalopathy [id number]   Coagulation disorder ([place]) 11/29/2019   AKI (acute kidney injury) ([place])    History of cocaine abuse ([place])    Acute ischemic right PCA stroke ([place]) 09/13/2019   Vision loss, bilateral    Tobacco abuse    Bradycardia    Non-sustained ventricular tachycardia (HCC)    Orthostatic dizziness 09/12/2019   Nonintractable headache    Slurred speech    Mixed diabetic hyperlipidemia associated with type 2 diabetes mellitus (HCC)    Finger joint swelling, right  06/27/2019   Bilateral hand pain 06/14/2019   PAD (peripheral artery disease) ([place]) 05/20/2019   Encounter for long-term (current) use of high-risk medication 04/30/2019   Atrial fibrillation, chronic ([place]) 04/08/2019   Fever 04/08/2019   Severe sepsis ([place]) 04/08/2019   Abdominal pain 04/07/2019   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/07/2019   Polyarthralgia 03/05/2019   Thrombocytopenia ([place]) 03/05/2019   Pain due to onychomycosis of toenails of both feet 11/20/2018   Diabetic neuropathy ([place]) 11/20/2018   Influenza A 06/22/2018   Community acquired pneumonia 06/22/2018   Arthritis 05/12/2017   Varicose veins of leg with swelling, bilateral 01/18/2017   Facial twitching    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 ([place]) 02/24/2015   Aspiration pneumonia ([place]) 10/12/2014   Osteoarthritis of right knee 02/11/2014   Pain and swelling of knee 01/08/2014   Pain in extremity 01/08/2014   Weakness due to cerebrovascular accident 01/08/2014   Pulmonary embolism ([place]) 06/19/2012   Gout 09/22/2011   CVA, old, speech/language deficit 09/01/2011   Cerebral infarction ([place]) 04/26/2011   Type 2 diabetes mellitus with diabetic polyneuropathy, without long-term current use of insulin ([place]) 04/26/2011   Accelerated hypertension 04/26/2011   Anemia 04/26/2011   DVT (deep venous thrombosis) ([place]) 04/26/2011   Essential hypertension 04/26/2011   PCP:  [facility] [place]:   [facility] (N), [place] [address] [place] ([address]) [place] [address] Phone: [phone] Fax: [phone]     Social Determinants of Health (SDOH) Social History: SDOH Screenings   Food Insecurity: No Food Insecurity (07/20/2022)  Housing: Low Risk  (07/20/2022)  Transportation Needs: No Transportation Needs (07/20/2022)  Utilities: Not At Risk (07/20/2022)  Alcohol Screen: Low Risk  (06/21/2022)  Depression (PHQ2-9): Medium  Risk (06/18/2022)  Financial Resource Strain: Low Risk  (06/21/2022)  Physical Activity: Inactive (06/21/2022)  Social Connections: Socially Isolated (06/21/2022)  Stress: Stress Concern Present (06/21/2022)  Tobacco Use: High Risk (07/29/2022)   SDOH Interventions:     Readmission Risk Interventions    01/29/2020   12:41 PM  Readmission Risk Prevention Plan  Transportation Screening Complete  Palliative Care Screening Not Applicable  Medication Review (RN Care Manager) Complete

## 2022-08-01 NOTE — Progress Notes (Signed)
PROGRESS NOTE    [name]  [id number] DOB: 1951/04/06 DOA: 07/29/2022 PCP: [facility] [place]  211A/211A-AA  LOS: 3 days   Brief hospital course:   Assessment & Plan: [name] is a 72 y.o. 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 He was very weak.     Aspiration pneumonia (HCC) Severe sepsis Acute respiratory failure with hypoxia Severe sepsis criteria include tachycardia, tachypnea, hypotension, leukocytosis with lactic acidosis --CT c/a/p showed Interval development of bilateral airspace disease, greatest in the lower lobes, which may reflect infection or aspiration. --O2 sat was 85% on room air patient was tachypneic to 25, needing 4 L O2 to maintain sats in the mid to high 90s --started on vanc and cefepime on presentation.  Vanc d/c'ed after MRSA screen neg Plan: --cont cefepime, for total of 5 days --SLP eval, cleared for dys 2 diet  Stool impaction --seen on CT --had large BM with enema --Miralax BID scheduled  Lactic acidosis, resolved --4.1 on presentation, worsened to 5.4 despite IVF --lactic acid normalized on 3/16  Generalized weakness Secondary to acute illness, chronic physical deconditioning secondary to chronic illnesses Patient is cared for at home by wife and daughter --PT eval, rec SNF rehab  Pancoast tumor of right lung  ([place]) Followed by oncology with plans for placement of Port-A-Cath on 3/15 for initiation of chemo  Acute metabolic encephalopathy Secondary to sepsis and stool impaction  AKI (acute kidney injury) ([place]) Creatinine 1.3, up from 0.68 just 2 days prior Secondary to sepsis --Cr improved with IVF --oral hydration now  Chronic hyponatremia Sodium 131 but baseline has been in the mid to high 120s Continue to monitor  History of DVT (deep vein thrombosis) History of PE Continue Xarelto  Atrial fibrillation, chronic (HCC) Continue Xarelto and metoprolol  Type 2 diabetes mellitus with diabetic polyneuropathy, without long-term current use of insulin (HCC) --BG have been within inpatient goal --d/c'ed BG checks and SSI  Essential hypertension --cont Toprol for rate control  History of CVA with residual deficit Supportive care Continue simvastatin  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  However, CT showed stable bony destruction of the adjacent cervical and thoracic vertebral bodies and overlying right first through third ribs. --oxycodone 10 mg q4h PRN --onc palliative to see on Monday for pain management   DVT prophylaxis: [id number]  Code Status: Full code Palliative care following Family Communication: girlfriend updated at bedside today Level of care: Med-Surg Dispo:   The patient is from: home Anticipated d/c is to: SNF recommended Anticipated d/c date is: to be determined   Subjective and Interval History:  Shoulder/arm pain appeared more controlled today.  Pt was complaining mainly of discomfort with his condom cath.   Objective: Vitals:   07/31/22 1948 08/01/22 0442 08/01/22 0721 08/01/22 1503  BP: 114/72 130/86 133/73 113/61  Pulse: 86 86 (!) 101  96  Resp: 18 18 14 18   Temp: 98 F (36.7 C) 98.2 F (36.8 C) 97.6 F (36.4  C)   TempSrc:   Oral   SpO2: 98% 100% 100% 100%  Weight:      Height:        Intake/Output Summary (Last 24 hours) at 08/01/2022 1829 Last data filed at 08/01/2022 1813 Gross per 24 hour  Intake 320 ml  Output 1700 ml  Net -1380 ml   Filed Weights   07/29/22 1924  Weight: 84.4 kg    Examination:   Constitutional: NAD, AAOx3 HEENT: conjunctivae and lids normal, EOMI CV: No cyanosis.   RESP: normal respiratory effort, on 1L Neuro: II - XII grossly intact.     Data Reviewed: I have personally reviewed labs and imaging studies  Time spent: 35 minutes  Enzo Bi, MD [facility] If 7PM-7AM, please contact night-coverage 08/01/2022, 6:29 PM

## 2022-08-02 ENCOUNTER — Other Ambulatory Visit: Payer: Medicare Other

## 2022-08-02 ENCOUNTER — Inpatient Hospital Stay: Payer: 59

## 2022-08-02 ENCOUNTER — Other Ambulatory Visit: Payer: 59

## 2022-08-02 ENCOUNTER — Ambulatory Visit: Payer: 59

## 2022-08-02 ENCOUNTER — Ambulatory Visit: Payer: Medicare Other | Admitting: Oncology

## 2022-08-02 ENCOUNTER — Ambulatory Visit: Payer: Medicare Other

## 2022-08-02 ENCOUNTER — Inpatient Hospital Stay: Payer: 59 | Admitting: Oncology

## 2022-08-02 DIAGNOSIS — Z515 Encounter for palliative care: Secondary | ICD-10-CM | POA: Diagnosis not present

## 2022-08-02 DIAGNOSIS — R6520 Severe sepsis without septic shock: Secondary | ICD-10-CM | POA: Diagnosis not present

## 2022-08-02 DIAGNOSIS — C3411 Malignant neoplasm of upper lobe, right bronchus or lung: Secondary | ICD-10-CM

## 2022-08-02 DIAGNOSIS — Z7189 Other specified counseling: Secondary | ICD-10-CM | POA: Diagnosis not present

## 2022-08-02 DIAGNOSIS — A419 Sepsis, unspecified organism: Secondary | ICD-10-CM | POA: Diagnosis not present

## 2022-08-02 LAB — BASIC METABOLIC PANEL
Anion gap: 9 (ref 5–15)
BUN: 11 mg/dL (ref 8–23)
CO2: 23 mmol/L (ref 22–32)
Calcium: 8.5 mg/dL — ABNORMAL LOW (ref 8.9–10.3)
Chloride: 98 mmol/L (ref 98–111)
Creatinine, Ser: 0.73 mg/dL (ref 0.61–1.24)
GFR, Estimated: >60 mL/min (ref >60)
Glucose, Bld: 111 mg/dL — ABNORMAL HIGH (ref 70–99)
Potassium: 4.3 mmol/L (ref 3.5–5.1)
Sodium: 130 mmol/L — ABNORMAL LOW (ref 135–145)

## 2022-08-02 LAB — CBC
HCT: 26.1 % — ABNORMAL LOW (ref 39.0–52.0)
Hemoglobin: 8.1 g/dL — ABNORMAL LOW (ref 13.0–17.0)
MCH: 24.1 pg — ABNORMAL LOW (ref 26.0–34.0)
MCHC: 31 g/dL (ref 30.0–36.0)
MCV: 77.7 fL — ABNORMAL LOW (ref 80.0–100.0)
Platelets: 234 10*3/uL (ref 150–400)
RBC: 3.36 MIL/uL — ABNORMAL LOW (ref 4.22–5.81)
RDW: 16.9 % — ABNORMAL HIGH (ref 11.5–15.5)
WBC: 7.3 10*3/uL (ref 4.0–10.5)
nRBC: 0 % (ref 0.0–0.2)

## 2022-08-02 LAB — GLUCOSE, CAPILLARY
Glucose-Capillary: 128 mg/dL — ABNORMAL HIGH (ref 70–99)
Glucose-Capillary: 106 mg/dL — ABNORMAL HIGH (ref 70–99)
Glucose-Capillary: 87 mg/dL (ref 70–99)
Glucose-Capillary: 136 mg/dL — ABNORMAL HIGH (ref 70–99)
Glucose-Capillary: 133 mg/dL — ABNORMAL HIGH (ref 70–99)

## 2022-08-02 LAB — MAGNESIUM: Magnesium: 1.8 mg/dL (ref 1.7–2.4)

## 2022-08-02 MED ORDER — FENTANYL 12 MCG/HR TD PT72
1.0000 | MEDICATED_PATCH | TRANSDERMAL | Status: DC
  Administered 2022-08-02: 1 via TRANSDERMAL
  Filled 2022-08-02 (×2): qty 1

## 2022-08-02 NOTE — Progress Notes (Signed)
PROGRESS NOTE    [name]  [id number] DOB: October 18, 1950 DOA: 07/29/2022 PCP: [facility] [place]  211A/211A-AA  LOS: 4 days   Brief hospital course:   Assessment & Plan: [name] is a 72 y.o. 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 He was very weak.     Aspiration pneumonia (HCC) Severe sepsis Acute respiratory failure with hypoxia Severe sepsis criteria include tachycardia, tachypnea, hypotension, leukocytosis with lactic acidosis --CT c/a/p showed Interval development of bilateral airspace disease, greatest in the lower lobes, which may reflect infection or aspiration. --O2 sat was 85% on room air patient was tachypneic to 25, needing 4 L O2 to maintain sats in the mid to high 90s --started on vanc and cefepime on presentation.  Vanc d/c'ed after MRSA screen  --SLP eval, cleared for dys 2 dietneg Plan: --cont cefepime, for total of 5 days  Stool impaction --seen on CT --had large BM with enema --Miralax BID scheduled  Lactic acidosis, resolved --4.1 on presentation, worsened to 5.4 despite IVF --lactic acid normalized on 3/16  Generalized weakness Secondary to acute illness, chronic physical deconditioning secondary to chronic illnesses Patient is cared for at home by wife and daughter --PT eval, rec SNF rehab  Pancoast tumor of right lung  ([place]) Followed by oncology with plans for placement of Port-A-Cath on 3/15 for initiation of chemo --onc palliative care in discussion with pt and family about goals of care  Acute metabolic encephalopathy Secondary to sepsis and stool impaction  AKI (acute kidney injury) ([place]) Creatinine 1.3, up from 0.68 just 2 days prior Secondary to sepsis --Cr improved with IVF --oral hydration now  Chronic hyponatremia Sodium 131 but baseline has been in the mid to high 120s Continue to monitor  History of DVT (deep vein thrombosis) History of PE Continue Xarelto  Atrial fibrillation, chronic (HCC) Continue Xarelto and metoprolol  Type 2 diabetes mellitus with diabetic polyneuropathy, without long-term current use of insulin (HCC) --BG have been within inpatient goal --d/c'ed BG checks and SSI  Essential hypertension --cont Toprol for rate control  History of CVA with residual deficit Supportive care Continue simvastatin  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  However, CT showed stable bony destruction of the adjacent cervical and thoracic vertebral bodies and overlying right first through third ribs. --oxycodone 10 mg q4h PRN --start fentanyl patch 12 mcg, per onc palliative care provider  Sebaceous cysts --on and next to penis --no need for inpatient intervention   DVT prophylaxis: [id number]  Code Status: Full code Palliative care following Family Communication:  Level of care: Med-Surg Dispo:   The patient is from: home Anticipated d/c is to: home.  Family declined SNF. Anticipated d/c date is: tomorrow   Subjective and Interval History:  Pt complained of some lesions on his penis.  Photos taken and shown to oncall urology who thought they  were sebaceous cyst.   Objective: Vitals:   08/01/22 2121 08/02/22 0506 08/02/22 0830  08/02/22 1358  BP: 121/85 127/78 129/87 121/78  Pulse: 92 93 88 91  Resp:   19 20  Temp: 97.6 F (36.4 C) 99 F (37.2 C) 98.3 F (36.8 C) 98.5 F (36.9 C)  TempSrc: Oral Oral Oral Oral  SpO2: 100% 100% 100% 100%  Weight:      Height:        Intake/Output Summary (Last 24 hours) at 08/02/2022 1912 Last data filed at 08/02/2022 1414 Gross per 24 hour  Intake 340 ml  Output 550 ml  Net -210 ml   Filed Weights   07/29/22 1924  Weight: 84.4 kg    Examination:   Constitutional: NAD, AAOx3 HEENT: conjunctivae and lids normal, EOMI CV: No cyanosis.   RESP: normal respiratory effort SKIN: warm, dry   Data Reviewed: I have personally reviewed labs and imaging studies  Time spent: 35 minutes  Mark Bi, MD [facility] If 7PM-7AM, please contact night-coverage 08/02/2022, 7:12 PM

## 2022-08-02 NOTE — Progress Notes (Signed)
Manufacturing engineer Liaison Referral Note  New referral received from Elpidio Galea, RN [facility] for hospice services upon discharge from the hospital.  Per Billey Chang, NP PMT, daughter request liaison to see her to discuss services tomorrow as she is having family meeting this evening to discuss with family.  Referral sent to referral intake office.  Hospital Liaison will follow up with patient and family tomorrow to discuss hospice services, philosophy and approach to care.  DME needs:  to be determined at tomorrow's visit. Address of hospice service:  to be determined at tomorrow's visit.  Thank you for allowing participation in this patient's care.  Dimas Aguas, RN Nurse Liaison [phone]

## 2022-08-02 NOTE — Progress Notes (Signed)
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 3. Increased size of a right mid hilar lymph node. 4. Stable 1.5 cm pleural-based nodule in the posteromedial extreme right base. 5. Aortic and coronary artery atherosclerosis. 6. Emphysema. 7. Retained or aspirated secretions in the proximal right main bronchus. Aortic Atherosclerosis (ICD10-I70.0) and Emphysema (ICD10-J43.9). Electronically Signed   By: Telford Nab M.D.   On: 07/19/2022 23:34     Assessment and plan- Patient is a 72 y.o. male ***   Visit Diagnosis 1. Sepsis with acute hypoxic respiratory failure without septic shock, due to unspecified organism ([place])   2. Palliative care encounter   3. Intractable pain      Dr. Randa Evens, MD, MPH [facility] at [facility] [id number] 08/02/2022 4:44 PM

## 2022-08-02 NOTE — Care Management Important Message (Signed)
Important Message  Patient Details  Name: [name] MRN: [id number] Date of Birth: 1950/12/10   Medicare Important Message Given:  Yes     [name] 08/02/2022, 1:25 PM

## 2022-08-02 NOTE — TOC Progression Note (Signed)
Transition of Care [facility]) - Progression Note    Patient Details  Name: [name] MRN: [id number] Date of Birth: [birth year]  Transition of [facility]) CM/SW Contact  Beverly Sessions, RN Phone Number: 08/02/2022, 2:30 PM  Clinical Narrative:     Spoke with daughter [name] she is aware that PT recs are for SNF She states plan is for patient to return home at discharge  Patient will either discharge to his home, where daughter will care for him during the day, and significant other will care for him at night.   Or if patient will be discharging to daughter's home where she will provide care  Daughter request hospital bed, declines lift  Daughter to call TOC back to let me know what address patient will be going to in order to arrange DME and home health through [facility]   Expected Discharge Plan: [place] Barriers to Discharge: Continued Medical Work up  Expected Discharge Plan and [place] arrangements for the past 2 months: [place]: PT, OT, Social Work [facility] Agency: Microbiologist ([place]) Date [place]: 07/30/22   Representative spoke with at [place]: [place] (SDOH) Interventions SDOH Screenings   Food Insecurity: No Food Insecurity (07/20/2022)  Housing: Low Risk  (07/20/2022)  Transportation Needs: No Transportation Needs (07/20/2022)  Utilities: Not At Risk (07/20/2022)  Alcohol Screen: Low Risk  (06/21/2022)  Depression (PHQ2-9): Medium Risk (06/18/2022)  Financial Resource Strain: Low Risk  (06/21/2022)  Physical Activity: Inactive (06/21/2022)  Social Connections: Socially Isolated (06/21/2022)  Stress: Stress Concern Present (06/21/2022)  Tobacco Use: High Risk (07/29/2022)    Readmission Risk Interventions    01/29/2020   12:41 PM  Readmission Risk Prevention Plan  Transportation Screening Complete  Palliative  Care Screening Not Applicable  Medication Review (RN Care Manager) Complete

## 2022-08-02 NOTE — Consult Note (Addendum)
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 right first, second and third ribs and right side of T1. There is osteopenia with degenerative changes of the spine. No other destructive bone lesions are seen. There are degenerative changes of the thoracic spine and multilevel bridging enthesopathy. IMPRESSION: 1. No acute intracranial CT findings or interval changes. Old infarcts. Chronic change. 2. 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 3. Increased size of a right mid hilar lymph node. 4. Stable 1.5 cm pleural-based nodule in the posteromedial extreme right base. 5. Aortic and coronary artery atherosclerosis. 6. Emphysema. 7. Retained or aspirated secretions in the proximal right main bronchus. Aortic Atherosclerosis (ICD10-I70.0) and Emphysema (ICD10-J43.9). Electronically Signed   By: Telford Nab M.D.   On: 07/19/2022 23:34    PERFORMANCE STATUS (ECOG) : 3 - Symptomatic, >50% confined to  bed  Review of Systems Unable to complete  Physical Exam General: NAD Pulmonary: Unlabored Extremities: no edema, no joint deformities Skin: no rashes Neurological: Weakness, expressive aphasia  IMPRESSION: Patient is known to me from clinic and his previous hospitalizations.    Patient has had improvement in his respiratory status on antibiotics.  He remains quite frail and weak and is now essentially bedbound.  Pain remains an ongoing problem with frequent dosing of oxycodone.  Patient has also had constipation, likely exacerbated  by opioids.  Communication with patient is challenged by his expressive aphasia.  Confusion waxes and wanes.  I called and spoke with his daughter, Marguerita Merles.  She recognizes that patient has declined over the past month.  Patient is now essentially bedbound.  In light of his poor performance status, daughter understands that patient is not felt to be a current candidate for systemic chemotherapy.  We discussed the probability that chemotherapy could cause him to decline faster than no treatment.  She says that she would like to take him home at time of discharge and would be interested in hospice care.  She plans to meet today with her mother (patient's significant other) and her siblings (patient's other daughter and son) to confirm decision making.  She also plans to speak with family about CODE STATUS as she does not feel that resuscitation would be in patient's best interest at this point.  PLAN: -Recommend best supportive care -Family discussing DNR -Referral to hospice -Start transdermal fentanyl 12 mcg every 72 hours -Continue oxycodone as needed for breakthrough pain -Daily bowel regimen  Case and plan discussed with Dr. Janese Banks  Time Total: 60 minutes  Visit consisted of counseling and education dealing with the complex and emotionally intense issues of symptom management and palliative care in the setting of serious and potentially  life-threatening illness.Greater than 50%  of this time was spent counseling and coordinating care related to the above assessment and plan.  Signed by: Altha Harm, PhD, NP-C

## 2022-08-02 NOTE — Progress Notes (Signed)
Mobility Specialist - Progress Note   08/02/22 1100  Mobility  Activity Dangled on edge of bed  Level of Assistance Moderate assist, patient does 50-74%  Distance Ambulated (ft) 0 ft  Activity Response Tolerated poorly  $Mobility charge 1 Mobility     Pre-mobility: 88 HR, 111/69 BP, 100% SpO2   Pt lying in bed upon arrival, utilizing 2L. Pt awake and alert to self. BP taken while supine: 111/69. Pt achieved EOB with modA. Appears SOB however sats maintaining > 95%. Pt voiced dizziness upon sitting. Poor sitting tolerance d/t pain in R ribs and back and quickly requested to return supine. Unable to attain sitting BP. Pt returned supine with alarm set, needs in reach. RN notified.    Kathee Delton Mobility Specialist 08/02/22, 11:23 AM

## 2022-08-02 NOTE — Consult Note (Signed)
Pharmacy Antibiotic Note  [name] is a 72 y.o. male admitted on 07/29/2022 with altered mental status. 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 --Per provider total duration of 5 days on cefepime, stop date added.  Height: 6\' 2"  (188 cm) Weight: 84.4 kg (186 lb) IBW/kg (Calculated) : 82.2  Temp (24hrs), Avg:98.3 F (36.8 C), Min:97.6 F (36.4 C), Max:99 F (37.2 C)  Recent Labs  Lab 07/29/22 1536 07/29/22 1729 07/30/22 0525 07/30/22 0907 07/31/22 0433 07/31/22 1019 08/01/22 0345 08/02/22 0410  WBC 17.1*  --  11.2*  --  8.8  --  7.6 7.3  CREATININE 1.30*  --  0.96  --  0.71  --  0.68 0.73  LATICACIDVEN 4.1* 3.1*  --  5.4*  --  1.6  --   --      Estimated Creatinine Clearance: 95.6 mL/min (by C-G formula based on SCr of 0.73 mg/dL).    No Known Allergies  Antimicrobials this admission: 3/14 cefepime >> 3/20 3/14 vancomycin >> 3/15   Microbiology results: 3/14 BCx: ngtd 3/14 MRSA PCR: not detected  Thank you for allowing pharmacy to be a part of this patient's care.  Lorin Picket, PharmD Clinical Pharmacist   08/02/2022 9:55 AM

## 2022-08-02 NOTE — Progress Notes (Signed)
PT Cancellation Note  Patient Details Name: [name] MRN: [id number] DOB: 02-Jul-1950   Cancelled Treatment:     Pt resting in bed, easily aroused however unable to maintain alertness. Poor tolerance for sitting edge of bed this am with Mobility Specialist due to significant pain. No family at bedside, pt unable to tolerate/participate in PT. Will await Hospice Consult and proceed accordingly.   [name] 08/02/2022, 3:05 PM

## 2022-08-03 ENCOUNTER — Ambulatory Visit: Payer: 59

## 2022-08-03 ENCOUNTER — Inpatient Hospital Stay: Payer: 59

## 2022-08-03 DIAGNOSIS — Z7189 Other specified counseling: Secondary | ICD-10-CM

## 2022-08-03 DIAGNOSIS — A419 Sepsis, unspecified organism: Secondary | ICD-10-CM | POA: Diagnosis not present

## 2022-08-03 DIAGNOSIS — R6520 Severe sepsis without septic shock: Secondary | ICD-10-CM | POA: Diagnosis not present

## 2022-08-03 DIAGNOSIS — C3411 Malignant neoplasm of upper lobe, right bronchus or lung: Secondary | ICD-10-CM

## 2022-08-03 LAB — CBC
HCT: 23.3 % — ABNORMAL LOW (ref 39.0–52.0)
Hemoglobin: 7.4 g/dL — ABNORMAL LOW (ref 13.0–17.0)
MCH: 24.1 pg — ABNORMAL LOW (ref 26.0–34.0)
MCHC: 31.8 g/dL (ref 30.0–36.0)
MCV: 75.9 fL — ABNORMAL LOW (ref 80.0–100.0)
Platelets: 232 10*3/uL (ref 150–400)
RBC: 3.07 MIL/uL — ABNORMAL LOW (ref 4.22–5.81)
RDW: 16.7 % — ABNORMAL HIGH (ref 11.5–15.5)
WBC: 9 10*3/uL (ref 4.0–10.5)
nRBC: 0 % (ref 0.0–0.2)

## 2022-08-03 LAB — CULTURE, BLOOD (ROUTINE X 2)
Culture: NO GROWTH
Culture: NO GROWTH
Special Requests: ADEQUATE

## 2022-08-03 LAB — BASIC METABOLIC PANEL
Anion gap: 8 (ref 5–15)
BUN: 11 mg/dL (ref 8–23)
CO2: 23 mmol/L (ref 22–32)
Calcium: 8.7 mg/dL — ABNORMAL LOW (ref 8.9–10.3)
Chloride: 97 mmol/L — ABNORMAL LOW (ref 98–111)
Creatinine, Ser: 0.7 mg/dL (ref 0.61–1.24)
GFR, Estimated: >60 mL/min (ref >60)
Glucose, Bld: 111 mg/dL — ABNORMAL HIGH (ref 70–99)
Potassium: 4.5 mmol/L (ref 3.5–5.1)
Sodium: 128 mmol/L — ABNORMAL LOW (ref 135–145)

## 2022-08-03 LAB — MAGNESIUM: Magnesium: 1.7 mg/dL (ref 1.7–2.4)

## 2022-08-03 NOTE — Progress Notes (Signed)
PROGRESS NOTE    [name]  [id number] DOB: 25-Feb-1951 DOA: 07/29/2022 PCP: [facility] [place]  211A/211A-AA  LOS: 5 days   Brief hospital course:   Assessment & Plan: [name] is a 72 y.o. 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       Aspiration pneumonia (HCC) Severe sepsis Acute respiratory failure with hypoxia Severe sepsis criteria include tachycardia, tachypnea, hypotension, leukocytosis with lactic acidosis --CT c/a/p showed Interval development of bilateral airspace disease, greatest in the lower lobes, which may reflect infection or aspiration. --on presentation, O2 sat was 85% on room air patient was tachypneic to 25, needing 4 L O2 to maintain sats in the mid to high 90s.  Currently sating well on room air. --started on vanc and cefepime on presentation.  Vanc d/c'ed after MRSA screen  --SLP eval, cleared for dys 2  --cont cefepime, for total of 5 days  Stool impaction --seen on CT --had large BM with enema --Miralax BID scheduled  Lactic acidosis, resolved --4.1 on presentation, worsened to 5.4 despite IVF --lactic acid normalized on 3/16  Generalized weakness Secondary to acute illness, chronic physical deconditioning secondary to chronic illnesses Patient was cared for at home by wife and daughter  Stage III squamous cell carcinoma of the right upper lobe  with C7-T2 vertebral body involvement Followed by oncology with plans for placement of Port-A-Cath on 3/15 for initiation of chemo, however, pt has significant functional  decline.  Family now decides to take pt home with hospice.  Acute metabolic encephalopathy Secondary to sepsis and stool impaction  AKI (acute kidney injury) ([place]) Creatinine 1.3, up from 0.68 just 2 days prior Secondary to sepsis --Cr improved with IVF --oral hydration now  Chronic hyponatremia Sodium 131 but baseline has been in the mid to high 120s Continue to monitor  History of DVT (deep vein thrombosis) History of PE Continue Xarelto  Atrial fibrillation, chronic (HCC) Continue Xarelto and metoprolol  Type 2 diabetes mellitus with diabetic polyneuropathy, without long-term current use of insulin (HCC) --BG have been within inpatient goal --d/c'ed BG checks and SSI  Essential hypertension --cont Toprol for rate control  History of CVA with residual deficit Supportive care Continue simvastatin  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  However, CT showed stable bony destruction of the adjacent cervical and thoracic vertebral bodies and overlying right first through third ribs. --oxycodone 10 mg q4h PRN --cont fentanyl patch 12 mcg (new), per onc palliative care provider  Sebaceous cysts --on and next to penis --no need for inpatient intervention   DVT prophylaxis: [id number]  Code Status: Full code Palliative care following, family still hasn't made the decision to change to DNR Family Communication:  Level of care: Med-Surg Dispo:   The patient is from: home Anticipated d/c is to: home with hospice Anticipated d/c date is: tomorrow after pt is set up with hospital bed.   Subjective and Interval History:  Pt appeared to be more somnolent today.  Hospice liaison to set up pt for going home with  hospice, however, pt is still currently Full Code.   Objective: Vitals:   08/03/22 0952 08/03/22 1615 08/03/22 1655 08/03/22 1740   BP:  137/73    Pulse:  89    Resp: 18 18 18 18   Temp:  [id number] F (36.9 C)    TempSrc:  Oral    SpO2:  99%    Weight:      Height:        Intake/Output Summary (Last 24 hours) at 08/03/2022 1935 Last data filed at 08/02/2022 2119 Gross per 24 hour  Intake 500.17 ml  Output --  Net 500.17 ml   Filed Weights   07/29/22 1924  Weight: 84.4 kg    Examination:   Constitutional: NAD, lethargic CV: No cyanosis.   RESP: normal respiratory effort, on RA SKIN: warm, dry   Data Reviewed: I have personally reviewed labs and imaging studies  Time spent: 35 minutes  Enzo Bi, MD [facility] If 7PM-7AM, please contact night-coverage 08/03/2022, 7:35 PM

## 2022-08-03 NOTE — Progress Notes (Signed)
[facility])  Plan is for patient to DC home with [facility] services once cleared from [facility].  Spoke with Daughter/Sheletha to initiate education related to hospice philosophy, services, and team approach to care. Sheletha verbalized understanding of information given. Per discussion, the plan is for patient to discharge home via EMS once cleared to DC.    DME needs discussed. Patient will need the following: Hospital bed w/ rails (bed alarm) O2 @ 2L prn per family request  No DME in the home.  Address verified and is correct in the chart. [name] is the family member to contact to arrange time of equipment delivery.    Please send signed and completed DNR home with patient/family. Please provide prescriptions at discharge as needed to ensure ongoing symptom management.    [facility] information and contact numbers given to family & above information shared with TOC.   Please call with any questions/concerns.    Thank you for the opportunity to participate in this patient's care.   Phillis Haggis, MSW [facility] Liaison  [phone]

## 2022-08-03 NOTE — TOC Progression Note (Signed)
Transition of Care [facility]) - Progression Note    Patient Details  Name: [name] MRN: [id number] Date of Birth: July 05, 1950  Transition of Care [facility]) CM/SW Contact  Beverly Sessions, RN Phone Number: 08/03/2022, 10:32 AM  Clinical Narrative:     Burnell Blanks NP made home with hospice referral yesterday to [place] with [facility] to coordinate discharge today with family and determine what address patient will be discharged to and DME needed   Expected Discharge Plan: [place] Barriers to Discharge: Continued Medical Work up  Expected Discharge Plan and [place] arrangements for the past 2 months: [place]: PT, OT, Social Work [facility] Agency: [place] ([place]) Date [place]: 07/30/22   Representative spoke with at [place]: [place] (SDOH) Interventions SDOH Screenings   Food Insecurity: No Food Insecurity (07/20/2022)  Housing: Low Risk  (07/20/2022)  Transportation Needs: No Transportation Needs (07/20/2022)  Utilities: Not At Risk (07/20/2022)  Alcohol Screen: Low Risk  (06/21/2022)  Depression (PHQ2-9): Medium Risk (06/18/2022)  Financial Resource Strain: Low Risk  (06/21/2022)  Physical Activity: Inactive (06/21/2022)  Social Connections: Socially Isolated (06/21/2022)  Stress: Stress Concern Present (06/21/2022)  Tobacco Use: High Risk (07/29/2022)    Readmission Risk Interventions    01/29/2020   12:41 PM  Readmission Risk Prevention Plan  Transportation Screening Complete  Palliative Care Screening Not Applicable  Medication Review (RN Care Manager) Complete

## 2022-08-04 ENCOUNTER — Ambulatory Visit: Payer: 59

## 2022-08-04 DIAGNOSIS — Z86711 Personal history of pulmonary embolism: Secondary | ICD-10-CM

## 2022-08-04 DIAGNOSIS — N179 Acute kidney failure, unspecified: Secondary | ICD-10-CM

## 2022-08-04 DIAGNOSIS — E1142 Type 2 diabetes mellitus with diabetic polyneuropathy: Secondary | ICD-10-CM

## 2022-08-04 MED ORDER — POLYETHYLENE GLYCOL 3350 17 G PO PACK: 17.0000 g | PACK | Freq: Every day | ORAL | 0 refills | Status: DC | PRN

## 2022-08-04 NOTE — TOC Transition Note (Addendum)
Transition of Care [facility]) - CM/SW Discharge Note   Patient Details  Name: [name] MRN: [id number] Date of Birth: 05-14-1951  Transition of Care [facility]) CM/SW Contact:  Beverly Sessions, RN Phone Number: 08/04/2022, 2:09 PM   Clinical Narrative:    Patient to discharge today with home with hospice services.   Per Lorayne Bender with [employer]  DME has been delivered to home Significant there confirms she will be at home to accept patient VM left for daughter [name] to update   EMS packet on chart.  EMS transport called    Barriers to Discharge: Continued Medical Work up   Patient Goals and CMS Choice   Choice offered to / list presented to : NA  Discharge Placement                         Discharge Plan and Services Additional resources added to the After Visit Summary for                            [facility] Arranged: PT, OT, Social Work [facility] Agency: [facility] ([place]) Date [place]: 07/30/22   Representative spoke with at [place]: Floydene Flock  Social Determinants of Health ([place]) Interventions SDOH Screenings   Food Insecurity: No Food Insecurity (07/20/2022)  Housing: Low Risk  (07/20/2022)  Transportation Needs: No Transportation Needs (07/20/2022)  Utilities: Not At Risk (07/20/2022)  Alcohol Screen: Low Risk  (06/21/2022)  Depression (PHQ2-9): Medium Risk (06/18/2022)  Financial Resource Strain: Low Risk  (06/21/2022)  Physical Activity: Inactive (06/21/2022)  Social Connections: Socially Isolated (06/21/2022)  Stress: Stress Concern Present (06/21/2022)  Tobacco Use: High Risk (07/29/2022)     Readmission Risk Interventions    01/29/2020   12:41 PM  Readmission Risk Prevention Plan  Transportation Screening Complete  Palliative Care Screening Not Applicable  Medication Review (RN Care Manager) Complete

## 2022-08-04 NOTE — Progress Notes (Signed)
Physical Therapy Treatment Patient Details Name: [name] MRN: [id number] DOB: 11-03-1950 Today's Date: 08/04/2022   History of Present Illness [name] is a 7yoM recently here  with concerns of hypercalcemia, now returns from home with sepsis and weakness.  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    PT Comments    Pt received in Semi-Fowler's position and agreeable to therapy.  Pt initially only willing to perform bed-level exercises, however pt reported that he was experiencing back pain.  Once pt was educated on remaining in bed likely causing some of the back pain, pt was much more agreeable in attempting STS.  Pt does have increased difficulty with performing, but is able to do so.  Pt's difficulty stems from R shoulder pain and being in gravity dependent position greatly increased the pain in the R shoulder.  Pt then returned to bed and all needs addressed and all bell was within reach.  Current discharge plans to SNF remain appropriate at this time.  Pt will continue to benefit from skilled therapy in order to address deficits listed below.      Recommendations for follow up therapy are one component of a multi-disciplinary discharge planning process, led by the attending physician.  Recommendations may be updated based on patient status, additional functional criteria and insurance authorization.  Follow Up Recommendations  Skilled nursing-short term rehab (<3 hours/day) Can patient physically be transported by private vehicle: No   Assistance Recommended at Discharge Frequent or constant Supervision/Assistance  Patient can return home with the following Assistance with cooking/housework;Direct supervision/assist for medications management;Assist for transportation;Help with stairs or ramp for entrance;A lot of help with walking and/or transfers;A lot of help with  bathing/dressing/bathroom;Assistance with feeding   Equipment Recommendations   (TBD)    Recommendations for Other Services       Precautions / Restrictions Restrictions Weight Bearing Restrictions: No     Mobility  Bed Mobility Overal bed mobility: Needs Assistance Bed Mobility: Supine to Sit, Sit to Supine     Supine to sit: Min assist Sit to supine: Min assist   General bed mobility comments: Pt was able to transition to/from sitting relatively well.  Still presenting with significant pain in the R shoulder.    Transfers Overall transfer level: Needs assistance Equipment used: Rolling walker (2 wheels) Transfers: Sit to/from Stand Sit to Stand: From elevated surface, Mod assist                Ambulation/Gait         Gait velocity: decreased     General Gait Details: unable to ambulate, but able to stand for ~45 seconds before he decided to sit due to the pain in the R shoulder.   Stairs             Wheelchair Mobility    Modified Rankin (Stroke Patients Only)       Balance Overall balance assessment: Needs assistance Sitting-balance support: Feet supported Sitting balance-Leahy Scale: Good Sitting balance - Comments: steady static sitting   Standing balance support: Single extremity supported Standing balance-Leahy Scale: Fair Standing balance comment: needs assitance 2/2 to RUE pain and unsteadiness                            Cognition Arousal/Alertness: Awake/alert Behavior During Therapy: Flat affect Overall Cognitive Status: Difficult to assess  General Comments: Pt with difficulty communicating due to affects of past CVA, oriented to self, place, and limited situation.  Follows simple instruction well but slow to coax some responses out of home        Exercises Total Joint Exercises Ankle Circles/Pumps: AROM, Strengthening, Both, 10 reps, Supine Quad Sets: AROM,  Strengthening, Both, 10 reps, Supine Gluteal Sets: AROM, Strengthening, Both, 10 reps, Supine Short Arc Quad: AROM, Strengthening, Both, 10 reps, Seated Heel Slides: AROM, Strengthening, Both, 10 reps, Supine Hip ABduction/ADduction: Strengthening, Both, 10 reps, Supine    General Comments        Pertinent Vitals/Pain Pain Assessment Pain Assessment: Faces Faces Pain Scale: Hurts whole lot Pain Location: R shoulder pain is, seeminly, excrutiating at rest and especially with attempts at moving and with light touch Pain Descriptors / Indicators: Sore, Guarding, Grimacing, Discomfort Pain Intervention(s): Limited activity within patient's tolerance, Monitored during session    Home Living                          Prior Function            PT Goals (current goals can now be found in the care plan section) Acute Rehab PT Goals Patient Stated Goal: control R shoulder pain PT Goal Formulation: Patient unable to participate in goal setting Time For Goal Achievement:  Potential to Achieve Goals: Fair Progress towards PT goals: Progressing toward goals    Frequency    Min 2X/week      PT Plan Current plan remains appropriate    Co-evaluation              AM-PAC PT "6 Clicks" Mobility   Outcome Measure  Help needed turning from your back to your side while in a flat bed without using bedrails?: A Little Help needed moving from lying on your back to sitting on the side of a flat bed without using bedrails?: A Little Help needed moving to and from a bed to a chair (including a wheelchair)?: A Little Help needed standing up from a chair using your arms (e.g., wheelchair or bedside chair)?: A Lot Help needed to walk in hospital room?: Total Help needed climbing 3-5 steps with a railing? : Total 6 Click Score: 13    End of Session Equipment Utilized During Treatment: Gait belt Activity Tolerance: Patient limited by pain Patient left: in bed;with call  bell/phone within reach;with nursing/sitter in room Nurse Communication: Mobility status PT Visit Diagnosis: Unsteadiness on feet (R26.81);Other abnormalities of gait and mobility (R26.89);Difficulty in walking, not elsewhere classified (R26.2);Dizziness and giddiness (R42);Pain Pain - Right/Left: Right Pain - part of body: Arm;Shoulder     Time: 1024-1040 PT Time Calculation (min) (ACUTE ONLY): 16 min  Charges:  $Therapeutic Activity: 8-22 mins                     Gwenlyn Saran, PT, DPT Physical Therapist - [facility]  08/04/22, 12:27 PM

## 2022-08-05 ENCOUNTER — Ambulatory Visit: Payer: 59

## 2022-08-05 ENCOUNTER — Ambulatory Visit: Payer: Medicare Other | Admitting: Podiatry

## 2022-08-05 NOTE — Discharge Summary (Signed)
Physician Discharge Summary   Patient: [name] MRN: [id number] DOB: 11/22/1950  Admit date:     07/29/2022  Discharge date: 08/04/2022  Discharge Physician: Hosie Poisson   PCP: [facility]   Recommendations at discharge:  Please follow up with hospice MD as recommended.   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   Assessment and Plan:Aspiration pneumonia ([place]) Severe sepsis Acute respiratory failure with hypoxia Severe sepsis criteria include tachycardia, tachypnea, hypotension, leukocytosis with lactic acidosis --CT c/a/p showed Interval development of bilateral airspace disease, greatest in the lower lobes, which may reflect infection or aspiration. --on presentation, O2 sat was 85%  on room air patient was tachypneic to 25, needing 4 L O2 to maintain sats in the mid to high 90s.  Currently sating well on room air. --started on vanc and cefepime on presentation.  Vanc d/c'ed after MRSA screen     Stool impaction --seen on CT --had large BM with enema --Miralax BID scheduled   Lactic acidosis, resolved --lactic acid normalized on 3/16   Generalized weakness Secondary to acute illness, chronic physical deconditioning secondary to chronic illnesses Patient was cared for at home by wife and daughter   Stage III squamous cell carcinoma of the right upper lobe  with C7-T2 vertebral body involvement Followed by oncology with plans for placement of Port-A-Cath on 3/15 for initiation of chemo, however, pt has significant functional decline.  Family now decides to take pt home with hospice.   Acute metabolic encephalopathy Secondary to sepsis and stool impaction   AKI (acute kidney injury) ([place]) Creatinine 1.3, up from 0.68 just 2 days prior Secondary to sepsis --Cr improved with IVF --oral hydration now   Chronic hyponatremia Sodium 131 but baseline has been in the mid to high 120s Continue to monitor   History of DVT (deep vein thrombosis) History of PE Continue Xarelto   Atrial fibrillation, chronic (HCC) Continue Xarelto and metoprolol   Type 2 diabetes mellitus with diabetic polyneuropathy, without long-term current use of insulin (HCC)    Essential hypertension --cont Toprol for rate control   History of CVA with residual deficit Supportive care Continue simvastatin   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  However, CT showed stable  bony destruction of the adjacent cervical and thoracic vertebral bodies and overlying right first through third ribs.    Sebaceous cysts --on and next to penis --no need for  inpatient intervention    Consultants: oncology Procedures performed: none  Disposition: Home Diet recommendation:  Discharge Diet Orders (From admission, onward)     Start     Ordered   08/04/22 0000  Diet - low sodium heart healthy        08/04/22 1405           Regular diet DISCHARGE MEDICATION: Allergies as of 08/04/2022   No Known Allergies      Medication List     STOP taking these medications    morphine 15 MG tablet Commonly known as: MSIR       TAKE these medications    albuterol 108 (90 Base) MCG/ACT inhaler Commonly known as: VENTOLIN HFA Inhale 2 puffs into the lungs every 6 (six) hours as needed for wheezing or shortness of breath.   allopurinol 100 MG tablet Commonly known as: ZYLOPRIM Take 100 mg by mouth daily.   barrier cream Crea Commonly known as: non-specified Apply topically to buttocks every shift (3 times daily).   docusate sodium 100 MG capsule Commonly known as: COLACE Take 100 mg by mouth 2 (two) times daily.   FeroSul 325 (65 FE) MG tablet Generic drug: ferrous sulfate Take 0.5 tablets by mouth daily with breakfast.   gabapentin 400 MG capsule Commonly known as: NEURONTIN Take 1 capsule (400 mg total) by mouth 2 (two) times daily.   latanoprost 0.005 % ophthalmic solution Commonly known as: XALATAN Place 1 drop into both eyes at bedtime.   metoprolol succinate 25 MG 24 hr tablet Commonly known as: TOPROL-XL Take 12.5 mg by mouth daily.   multivitamin with minerals Tabs tablet Take 1 tablet by mouth daily.   Narcan 4 MG/0.1ML Liqd nasal spray kit Generic drug: naloxone Place 0.4 mg into the nose once.   oxyCODONE 5 MG immediate release tablet Commonly known as: Oxy IR/ROXICODONE Take 1 tablet (5 mg total) by mouth every 6 (six) hours as needed for severe pain. What changed:  how much to take when to take this   polyethylene glycol 17 g packet Commonly known as: MIRALAX / GLYCOLAX Take 17 g by mouth daily  as needed.   rivaroxaban 20 MG Tabs tablet Commonly known as: XARELTO Take 1 tablet (20 mg total) by mouth daily with supper.   senna 8.6 MG Tabs tablet Commonly known as: SENOKOT Take 17.2 mg by mouth daily.   simvastatin 20 MG tablet Commonly known as: ZOCOR Take 20 mg by mouth at bedtime.        [place], [facility]. Schedule an appointment as soon as possible for a visit in 1 week(s).   Contact information: [address] [place] [address] [phone]                Discharge Exam: Filed Weights   07/29/22 1924  Weight: 84.4 kg   General exam: Appears calm and comfortable  Respiratory system: Clear to auscultation. Respiratory effort normal. Cardiovascular system: S1 & S2 heard, RRR. No JVD, Gastrointestinal system: Abdomen is nondistended, soft and nontender. Central nervous system: Alert and oriented. No focal neurological deficits. Extremities: Symmetric 5 x 5 power. Skin: No rashes, lesions or ulcers Psychiatry: Judgement and insight appear normal. Mood & affect appropriate.    Condition at discharge: fair  The results of significant diagnostics from this hospitalization (including imaging, microbiology, ancillary and laboratory) are listed below for reference.   Imaging Studies: DG Shoulder Right  Result Date: 07/31/2022 CLINICAL DATA:  Arm pain fracture EXAM: RIGHT SHOULDER - 2+ VIEW COMPARISON:  02/04/2022, CT 07/29/2022, shoulder radiograph 02/04/2022 FINDINGS: Moderate severe AC joint degenerative change. No acute fracture or malalignment. Subacromial spurring. Probable old fracture deformity distal clavicle with pseudoarthrosis between the undersurface of clavicle and the coracoid process. Right apical soft tissue mass with osseous destructive change of the right apical ribs and adjacent vertebra. IMPRESSION: 1. No acute osseous abnormality of the right shoulder. 2. Probable old fracture deformity of the  distal clavicle with pseudoarthrosis between the coracoid process and clavicle. 3. Moderate AC joint and glenohumeral degenerative change 4. Right apical soft tissue mass with osseous destructive change of the right apical ribs and adjacent vertebra. Electronically Signed   By: Donavan Foil M.D.   On: 07/31/2022 20:11   CT CHEST ABDOMEN PELVIS W CONTRAST  Result Date: 07/29/2022 CLINICAL DATA:  Right arm pain, sepsis, known right apical mass EXAM: CT CHEST, ABDOMEN, AND PELVIS WITH CONTRAST TECHNIQUE: Multidetector CT imaging of the chest, abdomen and pelvis was performed following the standard protocol during bolus administration of intravenous contrast. RADIATION DOSE REDUCTION: This exam was performed according to the departmental dose-optimization program which includes automated exposure control, adjustment of the mA and/or kV according to patient size and/or use of iterative reconstruction technique. CONTRAST:  161mL OMNIPAQUE IOHEXOL 300 MG/ML  SOLN COMPARISON:  07/19/2022 FINDINGS: CT CHEST FINDINGS Cardiovascular: Stable cardiomegaly, with prominent biatrial dilatation. Retained lead within the right ventricle unchanged. No pericardial effusion. Stable coronary artery atherosclerosis. No evidence of thoracic aortic aneurysm or dissection. Stable aortic atherosclerosis. Stable dilated main pulmonary artery consistent with pulmonary arterial hypertension. Mediastinum/Nodes: Pathologic mediastinal and right hilar adenopathy. Right hilar adenopathy measures up to 2.6 cm in short axis. Right paratracheal lymph node measures up to 1.2 cm in short axis. Stable appearance of the thyroid, trachea, and esophagus. No acute finding. Lungs/Pleura: A destructive right apical mass is again identified, the superior extent not imaged due to slice selection. This mass measures 9.2 x 7.3 cm in transverse dimension, with destructive changes of the overlying ribs and adjacent thoracic vertebral bodies. Since the prior  exam, bilateral areas of airspace disease have developed, greatest within the lower lobes. Findings are consistent with aspiration or infection. No effusion or pneumothorax. The central airways are patent. Musculoskeletal: Destructive changes of the right first and second ribs as well as the C7, T1, and T2 vertebral bodies again noted consistent with direct invasion by the right apical malignancy. There are no acute displaced fractures. Reconstructed images demonstrate no additional findings. CT ABDOMEN PELVIS FINDINGS Hepatobiliary: No focal liver abnormality is seen. No gallstones, gallbladder wall thickening, or biliary dilatation. Pancreas: Unremarkable. No pancreatic ductal dilatation or surrounding inflammatory changes. Spleen: Normal in size without focal abnormality. Adrenals/Urinary Tract: Marked cortical scarring upper pole left kidney. Otherwise the kidneys enhance normally. No urinary tract calculi or obstructive uropathy. Bladder is decompressed, limiting its evaluation. Nonspecific nodularity left adrenal gland measuring 1.1 cm, which did not demonstrate FDG uptake on recent PET scan. The right adrenal is unremarkable. Stomach/Bowel: No bowel obstruction or ileus. Retained stool throughout the colon, with a large amount of stool in the rectal vault concerning for fecal impaction. Normal appendix right lower quadrant. No bowel wall thickening or inflammatory change. Vascular/Lymphatic: IVC filter. Calcifications of the bilateral common femoral  veins likely reflect chronic thrombosis. Atherosclerosis of the abdominal aorta and its branches. No pathologic adenopathy. Reproductive: Prostate is unremarkable. Other: No free fluid or free intraperitoneal gas. No abdominal wall hernia. Musculoskeletal: No acute or destructive bony lesions. Reconstructed images demonstrate no additional findings. IMPRESSION: 1. Stable right apical mass, with stable bony destruction of the adjacent cervical and thoracic  vertebral bodies and overlying right first through third ribs. 2. Stable metastatic adenopathy within the mediastinum and right hilum. 3. Interval development of bilateral airspace disease, greatest in the lower lobes, which may reflect infection or aspiration. 4. Large amount of retained stool within the rectal vault, consistent with fecal impaction. No bowel obstruction or ileus. 5.  Aortic Atherosclerosis (ICD10-I70.0). Electronically Signed   By: Randa Ngo M.D.   On: 07/29/2022 19:06   DG Chest 1 View  Result Date: 07/29/2022 CLINICAL DATA:  Sepsis EXAM: CHEST  1 VIEW COMPARISON:  Portable exam 1542 hours compared to 06/25/2022 FINDINGS: Enlargement of cardiac silhouette. Question leadless pacemaker projecting over LEFT heart. Mediastinal contours and pulmonary vascularity normal. Minimal RIGHT basilar atelectasis. Lungs otherwise clear. No acute infiltrate, pleural effusion, or pneumothorax. Bones demineralized. IMPRESSION: Enlargement of cardiac silhouette with minimal RIGHT basilar atelectasis. Electronically Signed   By: Lavonia Dana M.D.   On: 07/29/2022 16:00   CT Head Wo Contrast  Result Date: 07/29/2022 CLINICAL DATA:  Altered mental status EXAM: CT HEAD WITHOUT CONTRAST TECHNIQUE: Contiguous axial images were obtained from the base of the skull through the vertex without intravenous contrast. RADIATION DOSE REDUCTION: This exam was performed according to the departmental dose-optimization program which includes automated exposure control, adjustment of the mA and/or kV according to patient size and/or use of iterative reconstruction technique. COMPARISON:  None Available. FINDINGS: Brain: Large region cephalo malacia in the LEFT frontal lobe as well as the bilateral occipital lobes not changed from comparison exam consistent remote infarctions. No intracranial hemorrhage. No midline shift or mass effect. No hydrocephalus. There are periventricular and subcortical white matter hypodensities.  Generalized cortical atrophy. Vascular: No hyperdense vessel or unexpected calcification. Skull: Normal. Negative for fracture or focal lesion. Sinuses/Orbits: Paranasal sinuses and mastoid air cells are clear. Orbits are clear. Other: None. IMPRESSION: 1. No acute intracranial findings. 2. 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 3. Atrophy and white matter microvascular disease. Electronically Signed   By: Suzy Bouchard M.D.   On: 07/29/2022 15:51   EP PPM/ICD IMPLANT  Result Date: 07/26/2022 Successful Micra AV 2 leadless pacemaker implantation   CT CHEST WO CONTRAST  Result Date: 07/19/2022 CLINICAL DATA:  Altered mental status, and abnormal labs with low calcium. 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 EXAM: CT HEAD WITHOUT CONTRAST CT CHEST WITHOUT CONTRAST TECHNIQUE: Contiguous axial images were obtained from the base of the skull through the vertex without intravenous contrast, with multiplanar reconstructions. Multidetector CT imaging of the chest was performed following the standard protocol without IV contrast, with multiplanar reconstructions. RADIATION DOSE REDUCTION: This exam was performed according to the departmental dose-optimization program which includes automated exposure control, adjustment of the mA and/or kV according to patient size and/or use of iterative reconstruction technique. COMPARISON:  CT scan head 06/25/2022, PET-CT 06/25/2022, chest CT without contrast 06/11/2022. FINDINGS: CT HEAD FINDINGS Brain: Again noted are chronic bilateral occipital lobe infarcts, and encephalomalacia due to a chronic left frontoparietal infarct. A linear lacunar infarct is also again noted superiorly in the right cerebellar hemisphere. No new  asymmetry is seen concerning for a cortical based acute infarct, hemorrhage, mass effect or shift of the midline. There is mild atrophy, small-vessel disease and atrophic  ventriculomegaly with persistent cavum septi pellucidi and cavum vergae. There are benign dural calcifications along the falx. Vascular: There are calcifications in both siphons, distal vertebral arteries. No hyperdense central vessels. Skull: Negative for fractures or focal lesions. Sinuses/Orbits: Old lens replacements. Otherwise negative orbits. There is mild membrane disease in the right ethmoid air cells. Other visible sinuses, mastoid air cells and middle ears are clear. Other: None. CT CHEST FINDINGS Cardiovascular: There is three-vessel coronary artery calcification. Aortic atherosclerosis and tortuosity without evidence of aneurysm. There is mild-to-moderate cardiomegaly. No pericardial effusion. Enlarged pulmonary trunk 4 cm indicating arterial hypertension. The pulmonary veins are decompressed. There are scattered calcifications in the great vessels. Mediastinum/Nodes: Previously tracer avid right mid hilar lymph node just above the distal right main pulmonary artery, today is estimated at 2.4 cm in short axis, previously 2.1 cm. There are stable shotty subcentimeter in short axis right paratracheal, pretracheal and subcarinal nodes. No other enlarged nodes are visible without contrast. Lungs/Pleura: There is again noted an ill-defined right apical invasive lung mass, with extension into the supraclavicular fossa, difficult to accurately measure without the benefit of IV contrast. It is not fully imaged at its upper extent but has enlarged in the AP and transverse axis. Today this measures 9 x 7.5 cm coronal and AP, previously 8.7 x 6 cm. There are increased destructive changes especially to the right first rib posterior and lateral portions, increased erosive change to the posterior right second and third ribs, and increased erosion into the right side of the T1 vertebral body, the right T1 pedicle and the right T1 transverse process and right side of the dorsal T1 arch. Associated with this, there is  increased tumor encroachment on the right anterior aspect of the thecal sac at T1 and on the right T1-2 exit foramen. The lungs are mildly emphysematous with paraseptal changes predominating. There are asymmetric pleural-parenchymal scar-like opacities underlying the right apical lung mass, and bilaterally scattered areas of subpleural reticulation. There is a calcified granuloma in the lingula. Stable 1.5 cm pleural-based nodule in the posteromedial extreme right base on 5:143. Remaining lungs are generally clear. No pneumonic process is seen. There are retained or aspirated secretions in the proximal right main bronchus. Upper abdomen: No mass or acute abnormality is seen. Musculoskeletal: See above regarding tumor erosive changes to the right first, second and third ribs and right side of T1. There is osteopenia with degenerative changes of the spine. No other destructive bone lesions are seen. There are degenerative changes of the thoracic spine and multilevel bridging enthesopathy. IMPRESSION: 1. No acute intracranial CT findings or interval changes. Old infarcts. Chronic change. 2. 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 3. Increased size of a right mid hilar lymph node. 4. Stable 1.5 cm pleural-based nodule in the posteromedial extreme right base. 5. Aortic and coronary artery atherosclerosis. 6. Emphysema. 7. Retained or aspirated secretions in the proximal right main bronchus. Aortic Atherosclerosis (ICD10-I70.0) and Emphysema (ICD10-J43.9). Electronically Signed   By: Telford Nab M.D.   On: 07/19/2022 23:34   CT HEAD WO CONTRAST (5MM)  Result Date: 07/19/2022 CLINICAL DATA:  Altered mental status, and  abnormal labs with low calcium. Recently diagnosed squamous carcinoma right upper lobe with a CT-guided biopsy  06/30/2022. EXAM: CT HEAD WITHOUT CONTRAST CT CHEST WITHOUT CONTRAST TECHNIQUE: Contiguous axial images were obtained from the base of the skull through the vertex without intravenous contrast, with multiplanar reconstructions. Multidetector CT imaging of the chest was performed following the standard protocol without IV contrast, with multiplanar reconstructions. RADIATION DOSE REDUCTION: This exam was performed according to the departmental dose-optimization program which includes automated exposure control, adjustment of the mA and/or kV according to patient size and/or use of iterative reconstruction technique. COMPARISON:  CT scan head 06/25/2022, PET-CT 06/25/2022, chest CT without contrast 06/11/2022. FINDINGS: CT HEAD FINDINGS Brain: Again noted are chronic bilateral occipital lobe infarcts, and encephalomalacia due to a chronic left frontoparietal infarct. A linear lacunar infarct is also again noted superiorly in the right cerebellar hemisphere. No new asymmetry is seen concerning for a cortical based acute infarct, hemorrhage, mass effect or shift of the midline. There is mild atrophy, small-vessel disease and atrophic ventriculomegaly with persistent cavum septi pellucidi and cavum vergae. There are benign dural calcifications along the falx. Vascular: There are calcifications in both siphons, distal vertebral arteries. No hyperdense central vessels. Skull: Negative for fractures or focal lesions. Sinuses/Orbits: Old lens replacements. Otherwise negative orbits. There is mild membrane disease in the right ethmoid air cells. Other visible sinuses, mastoid air cells and middle ears are clear. Other: None. CT CHEST FINDINGS Cardiovascular: There is three-vessel coronary artery calcification. Aortic atherosclerosis and tortuosity without evidence of aneurysm. There is mild-to-moderate cardiomegaly. No pericardial effusion. Enlarged pulmonary trunk 4 cm indicating arterial hypertension. The pulmonary veins  are decompressed. There are scattered calcifications in the great vessels. Mediastinum/Nodes: Previously tracer avid right mid hilar lymph node just above the distal right main pulmonary artery, today is estimated at 2.4 cm in short axis, previously 2.1 cm. There are stable shotty subcentimeter in short axis right paratracheal, pretracheal and subcarinal nodes. No other enlarged nodes are visible without contrast. Lungs/Pleura: There is again noted an ill-defined right apical invasive lung mass, with extension into the supraclavicular fossa, difficult to accurately measure without the benefit of IV contrast. It is not fully imaged at its upper extent but has enlarged in the AP and transverse axis. Today this measures 9 x 7.5 cm coronal and AP, previously 8.7 x 6 cm. There are increased destructive changes especially to the right first rib posterior and lateral portions, increased erosive change to the posterior right second and third ribs, and increased erosion into the right side of the T1 vertebral body, the right T1 pedicle and the right T1 transverse process and right side of the dorsal T1 arch. Associated with this, there is increased tumor encroachment on the right anterior aspect of the thecal sac at T1 and on the right T1-2 exit foramen. The lungs are mildly emphysematous with paraseptal changes predominating. There are asymmetric pleural-parenchymal scar-like opacities underlying the right apical lung mass, and bilaterally scattered areas of subpleural reticulation. There is a calcified granuloma in the lingula. Stable 1.5 cm pleural-based nodule in the posteromedial extreme right base on 5:143. Remaining lungs are generally clear. No pneumonic process is seen. There are retained or aspirated secretions in the proximal right main bronchus. Upper abdomen: No mass or acute abnormality is seen. Musculoskeletal: See above regarding tumor erosive changes to the right first, second and third ribs and right side  of T1. There is osteopenia with degenerative changes of the spine. No other destructive  bone lesions are seen. There are degenerative changes of the thoracic spine and multilevel bridging enthesopathy. IMPRESSION: 1. No acute intracranial CT findings or interval changes. Old infarcts. Chronic change. 2. 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 3. Increased size of a right mid hilar lymph node. 4. Stable 1.5 cm pleural-based nodule in the posteromedial extreme right base. 5. Aortic and coronary artery atherosclerosis. 6. Emphysema. 7. Retained or aspirated secretions in the proximal right main bronchus. Aortic Atherosclerosis (ICD10-I70.0) and Emphysema (ICD10-J43.9). Electronically Signed   By: Telford Nab M.D.   On: 07/19/2022 23:34    Microbiology: Results for orders placed or performed during the hospital encounter of 07/29/22  Resp panel by RT-PCR (RSV, Flu A&B, Covid) Anterior Nasal Swab     Status: None   Collection Time: 07/29/22  3:36 PM   Specimen: Anterior Nasal Swab  Result Value Ref Range Status   SARS Coronavirus 2 by RT PCR NEGATIVE NEGATIVE Final    Comment: (NOTE) SARS-CoV-2 target nucleic acids are NOT DETECTED.  The SARS-CoV-2 RNA is generally detectable in upper respiratory specimens during the acute phase of infection. The lowest concentration of SARS-CoV-2 viral copies this assay can detect is 138 copies/mL. A negative result does not preclude SARS-Cov-2 infection and should not be used as the sole basis for treatment or other patient management decisions. A negative result may occur with  improper specimen collection/handling, submission of specimen other than nasopharyngeal swab, presence of viral mutation(s) within the areas targeted by this assay, and inadequate number of  viral copies(<138 copies/mL). A negative result must be combined with clinical observations, patient history, and epidemiological information. The expected result is Negative.  Fact Sheet for Patients:  EntrepreneurPulse.com.au  Fact Sheet for Healthcare Providers:  IncredibleEmployment.be  This test is no t yet approved or cleared by the [place] FDA and  has been authorized for detection and/or diagnosis of SARS-CoV-2 by FDA under an Emergency Use Authorization (EUA). This EUA will remain  in effect (meaning this test can be used) for the duration of the COVID-19 declaration under Section 564(b)(1) of the Act, 21 U.S.C.section 360bbb-3(b)(1), unless the authorization is terminated  or revoked sooner.       Influenza A by PCR NEGATIVE NEGATIVE Final   Influenza B by PCR NEGATIVE NEGATIVE Final    Comment: (NOTE) The Xpert Xpress SARS-CoV-2/FLU/RSV plus assay is intended as an aid in the diagnosis of influenza from Nasopharyngeal swab specimens and should not be used as a sole basis for treatment. Nasal washings and aspirates are unacceptable for Xpert Xpress SARS-CoV-2/FLU/RSV testing.  Fact Sheet for Patients: EntrepreneurPulse.com.au  Fact Sheet for Healthcare Providers: IncredibleEmployment.be  This test is not yet approved or cleared by the [place] FDA and has been authorized for detection and/or diagnosis of SARS-CoV-2 by FDA under an Emergency Use Authorization (EUA). This EUA will remain in effect (meaning this test can be used) for the duration of the COVID-19 declaration under Section 564(b)(1) of the Act, 21 U.S.C. section 360bbb-3(b)(1), unless the authorization is terminated or revoked.     Resp Syncytial Virus by PCR NEGATIVE NEGATIVE Final    Comment: (NOTE) Fact Sheet for Patients: EntrepreneurPulse.com.au  Fact Sheet for Healthcare  Providers: IncredibleEmployment.be  This test is not yet approved or cleared by the [place]  FDA and has been authorized for detection and/or diagnosis of SARS-CoV-2 by FDA under an Emergency Use Authorization (EUA). This EUA will remain in effect (meaning this test can be used) for the duration of the COVID-19 declaration under Section 564(b)(1) of the Act, 21 U.S.C. section 360bbb-3(b)(1), unless the authorization is terminated or revoked.  Performed at [facility], [address] [place], [place] [address]   Blood Culture (routine x 2)     Status: None   Collection Time: 07/29/22  3:36 PM   Specimen: BLOOD  Result Value Ref Range Status   Specimen Description BLOOD BLOOD LEFT FOREARM  Final   Special Requests   Final    BOTTLES DRAWN AEROBIC AND ANAEROBIC Blood Culture results may not be optimal due to an inadequate volume of blood received in culture bottles   Culture   Final    NO GROWTH 5 DAYS Performed at [facility], [address], [place], [place] [address]    Report Status 08/03/2022 FINAL  Final  Blood Culture (routine x 2)     Status: None   Collection Time: 07/29/22  4:11 PM   Specimen: BLOOD  Result Value Ref Range Status   Specimen Description BLOOD BLOOD LEFT WRIST  Final   Special Requests   Final    BOTTLES DRAWN AEROBIC AND ANAEROBIC Blood Culture adequate volume   Culture   Final    NO GROWTH 5 DAYS Performed at [facility], [address] [place], [place] [address]    Report Status 08/03/2022 FINAL  Final  MRSA Next Gen by PCR, Nasal     Status: None   Collection Time: 07/29/22 11:21 PM   Specimen: Nasal Mucosa; Nasal Swab  Result Value Ref Range Status   MRSA by PCR Next Gen NOT DETECTED NOT DETECTED Final    Comment: (NOTE) The GeneXpert MRSA Assay (FDA approved for NASAL specimens only), is one component of a comprehensive MRSA colonization surveillance program. It is not intended to  diagnose MRSA infection nor to guide or monitor treatment for MRSA infections. Test performance is not FDA approved in patients less than 29 years old. Performed at [facility], [address] [place], [place] [address]     Labs: CBC: Recent Labs  Lab 07/29/22 1335 07/29/22 1335 07/29/22 1536 07/30/22 0525 07/31/22 0433 08/01/22 0345 08/02/22 0410 08/03/22 0509  WBC 17.1*   < > 17.1* 11.2* 8.8 7.6 7.3 9.0  NEUTROABS 15.0*  --  15.0*  --   --   --   --   --   HGB 9.4*   < > 9.8* 7.7* 8.2* 7.2* 8.1* 7.4*  HCT 29.6*  --  31.5* 24.1* 25.9* 22.3* 26.1* 23.3*  MCV 77.1*  --  78.4* 77.0* 76.2* 75.1* 77.7* 75.9*  PLT 260   < > 230 192 211 201 234 232   < > = values in this interval not displayed.   Basic Metabolic Panel: Recent Labs  Lab 07/30/22 0525 07/31/22 0433 08/01/22 0345 08/02/22 0410 08/03/22 0509  NA 133* 130* 128* 130* 128*  K 4.6 4.1 4.1 4.3 4.5  CL 99 99 100 98 97*  CO2 22 22 21* 23 23  GLUCOSE 112* 106* 109* 111* 111*  BUN 17 14 11 11 11   CREATININE 0.96 0.71 0.68 0.73 0.70  CALCIUM 8.7* 8.3* 8.1* 8.5* 8.7*  MG  --  1.6* 1.9 1.8 1.7   Liver Function Tests: Recent Labs  Lab 07/29/22 1335 07/29/22  1536  AST 26 24  ALT 12 12  ALKPHOS 72 71  BILITOT 0.9 0.9  PROT 7.2 6.7  ALBUMIN 2.9* 2.6*   CBG: Recent Labs  Lab 07/30/22 0104 07/30/22 0323 07/30/22 0842 07/30/22 1103 07/30/22 1715  GLUCAP 128* 106* 87 136* 133*    Discharge time spent: 35 minutes  Signed: Hosie Poisson, MD [facility] 08/05/2022

## 2022-08-06 ENCOUNTER — Ambulatory Visit: Payer: Self-pay

## 2022-08-06 ENCOUNTER — Ambulatory Visit: Payer: 59

## 2022-08-06 ENCOUNTER — Ambulatory Visit: Payer: Self-pay | Admitting: Oncology

## 2022-08-06 ENCOUNTER — Other Ambulatory Visit: Payer: Self-pay

## 2022-08-06 ENCOUNTER — Other Ambulatory Visit: Payer: Self-pay | Admitting: Oncology

## 2022-08-09 ENCOUNTER — Inpatient Hospital Stay: Payer: 59 | Admitting: Oncology

## 2022-08-09 ENCOUNTER — Ambulatory Visit: Payer: Medicare Other | Admitting: Oncology

## 2022-08-09 ENCOUNTER — Ambulatory Visit: Payer: Medicare Other

## 2022-08-09 ENCOUNTER — Ambulatory Visit: Payer: 59

## 2022-08-09 ENCOUNTER — Other Ambulatory Visit: Payer: Medicare Other

## 2022-08-09 ENCOUNTER — Inpatient Hospital Stay: Payer: 59

## 2022-08-10 ENCOUNTER — Ambulatory Visit: Payer: 59

## 2022-08-11 ENCOUNTER — Ambulatory Visit: Payer: 59

## 2022-08-12 ENCOUNTER — Ambulatory Visit: Payer: 59

## 2022-08-13 ENCOUNTER — Ambulatory Visit: Payer: Self-pay

## 2022-08-13 ENCOUNTER — Ambulatory Visit: Payer: 59

## 2022-08-13 ENCOUNTER — Other Ambulatory Visit: Payer: Self-pay

## 2022-08-13 ENCOUNTER — Ambulatory Visit: Payer: Self-pay | Admitting: Oncology

## 2022-08-16 ENCOUNTER — Ambulatory Visit: Payer: Self-pay

## 2022-08-16 ENCOUNTER — Ambulatory Visit: Payer: 59

## 2022-08-16 ENCOUNTER — Other Ambulatory Visit: Payer: Self-pay

## 2022-08-16 ENCOUNTER — Ambulatory Visit: Payer: Self-pay | Admitting: Oncology

## 2022-08-16 DEATH — deceased

## 2022-08-17 ENCOUNTER — Ambulatory Visit: Payer: 59

## 2022-08-18 ENCOUNTER — Ambulatory Visit: Payer: 59

## 2022-08-19 ENCOUNTER — Ambulatory Visit: Payer: 59

## 2022-08-20 ENCOUNTER — Ambulatory Visit: Payer: 59

## 2022-08-23 ENCOUNTER — Ambulatory Visit: Payer: 59

## 2022-08-24 ENCOUNTER — Ambulatory Visit: Payer: 59

## 2022-08-25 ENCOUNTER — Inpatient Hospital Stay: Payer: 59

## 2022-08-25 ENCOUNTER — Ambulatory Visit: Payer: 59

## 2022-08-26 ENCOUNTER — Ambulatory Visit: Payer: 59

## 2022-08-27 ENCOUNTER — Ambulatory Visit: Payer: 59

## 2022-08-27 ENCOUNTER — Telehealth: Payer: Self-pay

## 2022-08-27 NOTE — Telephone Encounter (Signed)
TC to patient daughter to follow up on new PC referral post patient SNF and hospital stay. Daughter shared that patient passed at home on 09-04-22. Brief grief support counseling provided to daughter during call as she shared she was with patient when he passed. Daughter made she can outreach [facility] bereavement department for more grief counseling if needed.

## 2022-08-30 ENCOUNTER — Ambulatory Visit: Payer: 59

## 2022-08-31 ENCOUNTER — Ambulatory Visit: Payer: 59

## 2022-09-01 ENCOUNTER — Ambulatory Visit: Payer: 59

## 2022-09-02 ENCOUNTER — Ambulatory Visit: Payer: 59

## 2022-09-03 ENCOUNTER — Ambulatory Visit: Payer: 59

## 2022-09-06 ENCOUNTER — Ambulatory Visit: Payer: 59

## 2022-09-07 ENCOUNTER — Ambulatory Visit: Payer: 59

## 2022-09-08 ENCOUNTER — Ambulatory Visit: Payer: 59

## 2022-09-09 ENCOUNTER — Ambulatory Visit: Payer: 59

## 2022-09-10 ENCOUNTER — Ambulatory Visit: Payer: 59

## 2022-09-13 ENCOUNTER — Ambulatory Visit: Payer: 59

## 2022-09-14 ENCOUNTER — Ambulatory Visit: Payer: 59

## 2022-09-15 ENCOUNTER — Ambulatory Visit: Payer: 59

## 2022-09-16 ENCOUNTER — Ambulatory Visit: Payer: 59

## 2022-09-17 ENCOUNTER — Ambulatory Visit: Payer: 59

## 2022-09-20 ENCOUNTER — Ambulatory Visit: Payer: 59

## 2022-09-21 ENCOUNTER — Ambulatory Visit: Payer: 59

## 2022-09-22 ENCOUNTER — Ambulatory Visit: Payer: 59

## 2023-03-09 ENCOUNTER — Ambulatory Visit (INDEPENDENT_AMBULATORY_CARE_PROVIDER_SITE_OTHER): Payer: Medicare Other | Admitting: Nurse Practitioner

## 2023-03-09 ENCOUNTER — Encounter (INDEPENDENT_AMBULATORY_CARE_PROVIDER_SITE_OTHER): Payer: Medicare Other

## 2023-07-22 NOTE — Telephone Encounter (Signed)
 error
